# Patient Record
Sex: Male | Born: 1940
Health system: Southern US, Community
[De-identification: ages and names within clinical notes are randomized; demographics above are authoritative.]

## PROBLEM LIST (undated history)

## (undated) DIAGNOSIS — Z862 Personal history of diseases of the blood and blood-forming organs and certain disorders involving the immune mechanism: Secondary | ICD-10-CM

## (undated) DIAGNOSIS — C679 Malignant neoplasm of bladder, unspecified: Secondary | ICD-10-CM

## (undated) DIAGNOSIS — I5022 Chronic systolic (congestive) heart failure: Secondary | ICD-10-CM

## (undated) DIAGNOSIS — I480 Paroxysmal atrial fibrillation: Secondary | ICD-10-CM

## (undated) DIAGNOSIS — B0230 Zoster ocular disease, unspecified: Secondary | ICD-10-CM

## (undated) DIAGNOSIS — G629 Polyneuropathy, unspecified: Secondary | ICD-10-CM

## (undated) DIAGNOSIS — I2510 Atherosclerotic heart disease of native coronary artery without angina pectoris: Secondary | ICD-10-CM

## (undated) DIAGNOSIS — C61 Malignant neoplasm of prostate: Secondary | ICD-10-CM

## (undated) DIAGNOSIS — Z9119 Patient's noncompliance with other medical treatment and regimen: Secondary | ICD-10-CM

## (undated) DIAGNOSIS — Z8546 Personal history of malignant neoplasm of prostate: Secondary | ICD-10-CM

## (undated) DIAGNOSIS — E785 Hyperlipidemia, unspecified: Secondary | ICD-10-CM

## (undated) DIAGNOSIS — C801 Malignant (primary) neoplasm, unspecified: Secondary | ICD-10-CM

## (undated) DIAGNOSIS — I252 Old myocardial infarction: Secondary | ICD-10-CM

## (undated) DIAGNOSIS — E669 Obesity, unspecified: Secondary | ICD-10-CM

## (undated) DIAGNOSIS — I10 Essential (primary) hypertension: Secondary | ICD-10-CM

## (undated) DIAGNOSIS — Z955 Presence of coronary angioplasty implant and graft: Secondary | ICD-10-CM

## (undated) DIAGNOSIS — I255 Ischemic cardiomyopathy: Secondary | ICD-10-CM

## (undated) DIAGNOSIS — N183 Chronic kidney disease, stage 3 unspecified: Secondary | ICD-10-CM

## (undated) DIAGNOSIS — K219 Gastro-esophageal reflux disease without esophagitis: Secondary | ICD-10-CM

## (undated) DIAGNOSIS — Z95810 Presence of automatic (implantable) cardiac defibrillator: Secondary | ICD-10-CM

## (undated) DIAGNOSIS — Z87442 Personal history of urinary calculi: Secondary | ICD-10-CM

## (undated) DIAGNOSIS — S8010XA Contusion of unspecified lower leg, initial encounter: Secondary | ICD-10-CM

## (undated) DIAGNOSIS — I219 Acute myocardial infarction, unspecified: Secondary | ICD-10-CM

## (undated) DIAGNOSIS — L03116 Cellulitis of left lower limb: Secondary | ICD-10-CM

## (undated) DIAGNOSIS — M1990 Unspecified osteoarthritis, unspecified site: Secondary | ICD-10-CM

## (undated) DIAGNOSIS — K635 Polyp of colon: Secondary | ICD-10-CM

## (undated) DIAGNOSIS — G4733 Obstructive sleep apnea (adult) (pediatric): Secondary | ICD-10-CM

## (undated) DIAGNOSIS — E119 Type 2 diabetes mellitus without complications: Secondary | ICD-10-CM

## (undated) DIAGNOSIS — Z91199 Patient's noncompliance with other medical treatment and regimen due to unspecified reason: Secondary | ICD-10-CM

## (undated) DIAGNOSIS — M353 Polymyalgia rheumatica: Secondary | ICD-10-CM

## (undated) HISTORY — PX: LAPAROSCOPIC INGUINAL HERNIA REPAIR: SUR788

## (undated) HISTORY — DX: Essential (primary) hypertension: I10

## (undated) HISTORY — PX: CORONARY ANGIOPLASTY WITH STENT PLACEMENT: SHX49

## (undated) HISTORY — DX: Atherosclerotic heart disease of native coronary artery without angina pectoris: I25.10

## (undated) HISTORY — DX: Gastro-esophageal reflux disease without esophagitis: K21.9

## (undated) HISTORY — DX: Hyperlipidemia, unspecified: E78.5

## (undated) HISTORY — PX: OTHER SURGICAL HISTORY: SHX169

## (undated) HISTORY — DX: Ischemic cardiomyopathy: I25.5

## (undated) HISTORY — DX: Zoster ocular disease, unspecified: B02.30

## (undated) HISTORY — DX: Polymyalgia rheumatica: M35.3

## (undated) HISTORY — DX: Polyp of colon: K63.5

## (undated) HISTORY — DX: Obesity, unspecified: E66.9

## (undated) HISTORY — DX: Unspecified osteoarthritis, unspecified site: M19.90

---

## 1999-09-16 ENCOUNTER — Ambulatory Visit (HOSPITAL_COMMUNITY): Admission: RE | Admit: 1999-09-16 | Discharge: 1999-09-16 | Payer: Self-pay | Admitting: *Deleted

## 2008-09-11 ENCOUNTER — Ambulatory Visit (HOSPITAL_COMMUNITY): Admission: RE | Admit: 2008-09-11 | Discharge: 2008-09-12 | Payer: Self-pay | Admitting: General Surgery

## 2008-09-26 ENCOUNTER — Ambulatory Visit: Admission: RE | Admit: 2008-09-26 | Discharge: 2008-12-25 | Payer: Self-pay | Admitting: Radiation Oncology

## 2008-11-24 ENCOUNTER — Ambulatory Visit (HOSPITAL_BASED_OUTPATIENT_CLINIC_OR_DEPARTMENT_OTHER): Admission: RE | Admit: 2008-11-24 | Discharge: 2008-11-24 | Payer: Self-pay | Admitting: Urology

## 2008-12-29 ENCOUNTER — Ambulatory Visit: Admission: RE | Admit: 2008-12-29 | Discharge: 2009-01-30 | Payer: Self-pay | Admitting: Radiation Oncology

## 2009-02-05 ENCOUNTER — Encounter: Payer: Self-pay | Admitting: Family Medicine

## 2009-06-20 ENCOUNTER — Ambulatory Visit: Payer: Self-pay | Admitting: Family Medicine

## 2009-06-20 DIAGNOSIS — I10 Essential (primary) hypertension: Secondary | ICD-10-CM | POA: Insufficient documentation

## 2009-06-20 DIAGNOSIS — E1365 Other specified diabetes mellitus with hyperglycemia: Secondary | ICD-10-CM | POA: Insufficient documentation

## 2009-06-20 DIAGNOSIS — E785 Hyperlipidemia, unspecified: Secondary | ICD-10-CM | POA: Insufficient documentation

## 2009-06-20 DIAGNOSIS — N401 Enlarged prostate with lower urinary tract symptoms: Secondary | ICD-10-CM | POA: Insufficient documentation

## 2009-06-20 DIAGNOSIS — E1351 Other specified diabetes mellitus with diabetic peripheral angiopathy without gangrene: Secondary | ICD-10-CM | POA: Insufficient documentation

## 2009-06-20 DIAGNOSIS — N138 Other obstructive and reflux uropathy: Secondary | ICD-10-CM | POA: Insufficient documentation

## 2009-06-20 DIAGNOSIS — C61 Malignant neoplasm of prostate: Secondary | ICD-10-CM | POA: Insufficient documentation

## 2009-06-21 LAB — CONVERTED CEMR LAB
ALT: 20 units/L (ref 0–53)
Albumin: 3.9 g/dL (ref 3.5–5.2)
Chloride: 105 meq/L (ref 96–112)
Cholesterol: 148 mg/dL (ref 0–200)
GFR calc non Af Amer: 89.85 mL/min (ref >60)
HDL: 48.8 mg/dL (ref >39.00)
Hgb A1c MFr Bld: 6.6 % — ABNORMAL HIGH (ref 4.6–6.5)
LDL Cholesterol: 83 mg/dL (ref 0–99)
Potassium: 4.6 meq/L (ref 3.5–5.1)
Sodium: 142 meq/L (ref 135–145)
Total Protein: 6.9 g/dL (ref 6.0–8.3)
Triglycerides: 83 mg/dL (ref 0.0–149.0)
VLDL: 16.6 mg/dL (ref 0.0–40.0)

## 2009-06-22 ENCOUNTER — Telehealth: Payer: Self-pay | Admitting: Family Medicine

## 2009-06-25 ENCOUNTER — Telehealth: Payer: Self-pay | Admitting: *Deleted

## 2009-07-09 ENCOUNTER — Encounter: Payer: Self-pay | Admitting: Family Medicine

## 2009-07-19 ENCOUNTER — Ambulatory Visit: Payer: Self-pay | Admitting: Family Medicine

## 2009-07-19 DIAGNOSIS — B0239 Other herpes zoster eye disease: Secondary | ICD-10-CM | POA: Insufficient documentation

## 2009-07-27 ENCOUNTER — Ambulatory Visit: Payer: Self-pay | Admitting: Family Medicine

## 2009-08-23 ENCOUNTER — Telehealth: Payer: Self-pay | Admitting: Internal Medicine

## 2009-09-18 ENCOUNTER — Telehealth (INDEPENDENT_AMBULATORY_CARE_PROVIDER_SITE_OTHER): Payer: Self-pay | Admitting: *Deleted

## 2009-09-24 ENCOUNTER — Ambulatory Visit: Payer: Self-pay | Admitting: Family Medicine

## 2009-09-25 LAB — CONVERTED CEMR LAB
ALT: 21 units/L (ref 0–53)
AST: 24 units/L (ref 0–37)
Cholesterol: 144 mg/dL (ref 0–200)
HDL: 43 mg/dL (ref >39.00)
Hgb A1c MFr Bld: 6.5 % (ref 4.6–6.5)
Total Bilirubin: 0.8 mg/dL (ref 0.3–1.2)
Total Protein: 6.2 g/dL (ref 6.0–8.3)
VLDL: 17.6 mg/dL (ref 0.0–40.0)

## 2009-11-02 ENCOUNTER — Encounter (INDEPENDENT_AMBULATORY_CARE_PROVIDER_SITE_OTHER): Payer: Self-pay | Admitting: *Deleted

## 2009-12-20 ENCOUNTER — Telehealth: Payer: Self-pay | Admitting: Family Medicine

## 2009-12-21 ENCOUNTER — Ambulatory Visit: Payer: Self-pay | Admitting: Family Medicine

## 2009-12-21 DIAGNOSIS — J209 Acute bronchitis, unspecified: Secondary | ICD-10-CM | POA: Insufficient documentation

## 2010-01-28 ENCOUNTER — Ambulatory Visit: Payer: Self-pay | Admitting: Family Medicine

## 2010-01-28 DIAGNOSIS — L259 Unspecified contact dermatitis, unspecified cause: Secondary | ICD-10-CM | POA: Insufficient documentation

## 2010-01-28 DIAGNOSIS — J310 Chronic rhinitis: Secondary | ICD-10-CM | POA: Insufficient documentation

## 2010-01-30 LAB — CONVERTED CEMR LAB
Hgb A1c MFr Bld: 7 % — ABNORMAL HIGH (ref 4.6–6.5)
Microalb Creat Ratio: 9.7 mg/g (ref 0.0–30.0)

## 2010-02-11 ENCOUNTER — Ambulatory Visit: Payer: Self-pay | Admitting: Family Medicine

## 2010-04-01 ENCOUNTER — Telehealth: Payer: Self-pay | Admitting: Family Medicine

## 2010-06-10 ENCOUNTER — Ambulatory Visit: Payer: Self-pay | Admitting: Family Medicine

## 2010-06-12 LAB — CONVERTED CEMR LAB: Hgb A1c MFr Bld: 7.7 % — ABNORMAL HIGH (ref 4.6–6.5)

## 2010-06-28 ENCOUNTER — Telehealth: Payer: Self-pay | Admitting: Family Medicine

## 2010-08-09 ENCOUNTER — Telehealth: Payer: Self-pay | Admitting: Family Medicine

## 2010-08-29 ENCOUNTER — Ambulatory Visit: Payer: Self-pay | Admitting: Family Medicine

## 2010-09-12 ENCOUNTER — Ambulatory Visit: Payer: Self-pay | Admitting: Family Medicine

## 2010-11-25 ENCOUNTER — Ambulatory Visit: Payer: Self-pay | Admitting: Family Medicine

## 2010-11-25 LAB — CONVERTED CEMR LAB
Cholesterol, target level: 200 mg/dL
HDL goal, serum: 40 mg/dL
LDL Goal: 100 mg/dL

## 2010-11-28 LAB — CONVERTED CEMR LAB
Direct LDL: 142.9 mg/dL
HDL: 40 mg/dL (ref >39.00)
Hgb A1c MFr Bld: 6.7 % — ABNORMAL HIGH (ref 4.6–6.5)
Triglycerides: 87 mg/dL (ref 0.0–149.0)

## 2011-01-13 ENCOUNTER — Ambulatory Visit
Admission: RE | Admit: 2011-01-13 | Discharge: 2011-01-13 | Payer: Self-pay | Source: Home / Self Care | Attending: Family Medicine | Admitting: Family Medicine

## 2011-01-13 ENCOUNTER — Encounter: Payer: Self-pay | Admitting: Family Medicine

## 2011-01-13 DIAGNOSIS — R109 Unspecified abdominal pain: Secondary | ICD-10-CM | POA: Insufficient documentation

## 2011-01-14 ENCOUNTER — Telehealth: Payer: Self-pay | Admitting: Family Medicine

## 2011-01-14 NOTE — Assessment & Plan Note (Signed)
Summary: URI, congestion, OV per Dr Cleotis Nipper   Vital Signs:  Patient profile:   70 year old male Temp:     99.0 degrees F oral BP sitting:   120 / 80  (left arm) Cuff size:   large  Vitals Entered By: Sid Falcon LPN (December 21, 2009 4:29 PM) CC: URI, congestion, terrible cough   History of Present Illness: Acute visit. One week history of progressive cough. Wheezing off and on. Cough especially bad at night. Cough productive yellowish sputum. He tried Mucinex without much improvement. No fever. Nonsmoker.  Allergies: 1)  Lipitor (Atorvastatin Calcium)  Past History:  Past Medical History: Last updated: 06/20/2009 Arthritis Prostate cancer Diabetes Hyperlipidemia Hypertension  Social History: Last updated: 06/20/2009 Occupation:  Truck driver Married Past smoker, quit  Review of Systems      See HPI  Physical Exam  General:  Well-developed,well-nourished,in no acute distress; alert,appropriate and cooperative throughout examination Ears:  External ear exam shows no significant lesions or deformities.  Otoscopic examination reveals clear canals, tympanic membranes are intact bilaterally without bulging, retraction, inflammation or discharge. Hearing is grossly normal bilaterally. Mouth:  Oral mucosa and oropharynx without lesions or exudates.  Teeth in good repair. Neck:  No deformities, masses, or tenderness noted. Lungs:  patient has some diffuse expiratory wheezes. No rales. No retractions. Symmetric breath sounds. Heart:  Normal rate and regular rhythm. S1 and S2 normal without gallop, murmur, click, rub or other extra sounds.   Impression & Recommendations:  Problem # 1:  BRONCHITIS, ACUTE WITH MILD BRONCHOSPASM (ICD-466.0)  Depo-Medrol 80 mg IM given. Start Zithromax. Cough medication prescribed  Orders: Depo- Medrol 80mg  (J1040) Admin of Therapeutic Inj  intramuscular or subcutaneous (43329)  His updated medication list for this problem  includes:    Hydrocodone-homatropine 5-1.5 Mg/64ml Syrp (Hydrocodone-homatropine) ..... One tsp by mouth q 4-6 hours as needed cough    Azithromycin 250 Mg Tabs (Azithromycin) .Marland Kitchen... 2 by mouth today then one by mouth once daily for 4 days.  Complete Medication List: 1)  Hydrochlorothiazide 25 Mg Tabs (Hydrochlorothiazide) .... Once daily 2)  Crestor 10 Mg Tabs (Rosuvastatin calcium) .... Once daily 3)  Lisinopril 10 Mg Tabs (Lisinopril) .... One by mouth daily 4)  Actos 45 Mg Tabs (Pioglitazone hcl) .... Once daily 5)  Centrum Silver Ultra Mens Tabs (Multiple vitamins-minerals) .... Once daily 6)  Hydrocodone-homatropine 5-1.5 Mg/25ml Syrp (Hydrocodone-homatropine) .... One tsp by mouth q 4-6 hours as needed cough 7)  Azithromycin 250 Mg Tabs (Azithromycin) .... 2 by mouth today then one by mouth once daily for 4 days.  Patient Instructions: 1)  Acute Bronchitis symptoms for less then 10 days are not  helped by antibiotics. Take over the counter cough medications. Call if no improvement in 5-7 days, sooner if increasing cough, fever, or new symptoms ( shortness of breath, chest pain) .  Prescriptions: AZITHROMYCIN 250 MG TABS (AZITHROMYCIN) 2 by mouth today then one by mouth once daily for 4 days.  #6 x 0   Entered and Authorized by:   Evelena Peat MD   Signed by:   Evelena Peat MD on 12/21/2009   Method used:   Print then Give to Patient   RxID:   [id number] HYDROCODONE-HOMATROPINE 5-1.5 MG/5ML SYRP (HYDROCODONE-HOMATROPINE) one tsp by mouth q 4-6 hours as needed cough  #120 ml x 0   Entered and Authorized by:   Evelena Peat MD   Signed by:   Evelena Peat MD on 12/21/2009   Method used:  Print then Give to Patient   RxID:   [id number]     Medication Administration  Injection # 1:    Medication: Depo- Medrol 80mg     Diagnosis: BRONCHITIS, ACUTE WITH MILD BRONCHOSPASM (ICD-466.0)    Route: IM    Site: LUOQ gluteus    Exp Date: 09/14/2010    Lot #:  [id number]    Mfr: teva    Patient tolerated injection without complications    Given by: Sid Falcon LPN (December 21, 2009 5:02 PM)  Orders Added: 1)  Est. Patient Level III [21308] 2)  Depo- Medrol 80mg  [J1040] 3)  Admin of Therapeutic Inj  intramuscular or subcutaneous [65784]

## 2011-01-14 NOTE — Progress Notes (Signed)
Summary: Refill One touch Test Strips  Phone Note Refill Request Call back at Work Phone [phone] Message from:  Patient---live call  Refills Requested: Medication #1:  ONETOUCH ULTRA TEST  STRP test daily call [facility] on [address].  Initial call taken by: Warnell Forester,  August 09, 2010 10:05 AM  Follow-up for Phone Call        OK to refill Follow-up by: Evelena Peat MD,  August 09, 2010 11:30 AM    Prescriptions: Koren Bound TEST  STRP (GLUCOSE BLOOD) test daily  #100 x 3   Entered by:   Sid Falcon LPN   Authorized by:   Evelena Peat MD   Signed by:   Sid Falcon LPN on 57/84/6962   Method used:   Electronically to        [facility] [address] (retail)       [phone]       [name], [place]  [address]       Ph: [phone]       Fax: [phone]   RxID:   [id number]

## 2011-01-14 NOTE — Assessment & Plan Note (Signed)
Summary: shingles/cjr   Vital Signs:  Patient profile:   70 year old male Temp:     99.1 degrees F oral BP sitting:   120 / 70  (left arm) Cuff size:   large  Vitals Entered By: Sid Falcon LPN (February 11, 2010 11:41 AM) CC: Shingles ?   History of Present Illness: Patient seen with the following items.  First, he had shingles several months ago with left facial involvement. No residual pain or rash. Over the past week has had some itching in distribution similar to his previous rash. Increased work stress and he thinks that may have exacerbated.  Patient has a new rash which is bilateral axillary. Denies any change in deodorant. Rash is pruritic. No associated pain. No alleviating factors. No clear exacerbating factors. No other areas of rash.  Allergies: 1)  Lipitor (Atorvastatin Calcium)  Past History:  Past Medical History: Last updated: 06/20/2009 Arthritis Prostate cancer Diabetes Hyperlipidemia Hypertension PMH reviewed for relevance  Review of Systems  The patient denies anorexia, fever, vision loss, and chest pain.    Physical Exam  General:  Well-developed,well-nourished,in no acute distress; alert,appropriate and cooperative throughout examination Head:  Normocephalic and atraumatic without obvious abnormalities. No apparent alopecia or balding. Eyes:  pupils equal, pupils round, and pupils reactive to light.  pupils equal, pupils round, and pupils reactive to light.   Ears:  External ear exam shows no significant lesions or deformities.  Otoscopic examination reveals clear canals, tympanic membranes are intact bilaterally without bulging, retraction, inflammation or discharge. Hearing is grossly normal bilaterally. Mouth:  Oral mucosa and oropharynx without lesions or exudates.  Teeth in good repair. Lungs:  Normal respiratory effort, chest expands symmetrically. Lungs are clear to auscultation, no crackles or wheezes. Heart:  normal rate and regular  rhythm.  normal rate and regular rhythm.   Skin:  patient has nonspecific macular slightly erythematous rash axillary region right and left in patches. Non-scaly. No pustules. Nontender.   Impression & Recommendations:  Problem # 1:  ECZEMA (ICD-692.9)  His updated medication list for this problem includes:    Triamcinolone Acetonide 0.5 % Crea (Triamcinolone acetonide) .Marland Kitchen... Apply to affected rash two times a day as needed  Complete Medication List: 1)  Hydrochlorothiazide 25 Mg Tabs (Hydrochlorothiazide) .... Once daily 2)  Crestor 10 Mg Tabs (Rosuvastatin calcium) .... Once daily 3)  Lisinopril 10 Mg Tabs (Lisinopril) .... One by mouth daily 4)  Centrum Silver Ultra Mens Tabs (Multiple vitamins-minerals) .... Once daily 5)  Metformin Hcl 500 Mg Tabs (Metformin hcl) .... One by mouth two times a day 6)  Triamcinolone Acetonide 0.5 % Crea (Triamcinolone acetonide) .... Apply to affected rash two times a day as needed 7)  Onetouch Ultra Test Strp (Glucose blood) .... Test daily  Patient Instructions: 1)  Consider use of over-the-counter Claritin or Zyrtec for itching Prescriptions: ONETOUCH ULTRA TEST  STRP (GLUCOSE BLOOD) test daily  #100 x 3   Entered by:   Sid Falcon LPN   Authorized by:   Evelena Peat MD   Signed by:   Sid Falcon LPN on 16/09/9603   Method used:   Electronically to        [facility] (retail)       [address]       [place], [place]  [address]       Ph: [phone] or [phone]       Fax: [phone]   RxID:   [id number] TRIAMCINOLONE ACETONIDE 0.5 %  CREA (TRIAMCINOLONE ACETONIDE) apply to affected rash two times a day as needed  #30 gm x 3   Entered and Authorized by:   Evelena Peat MD   Signed by:   Evelena Peat MD on 02/11/2010   Method used:   Electronically to        [facility] (retail)       [address]       [place], [place]  [address]       Ph: [phone] or [phone]       Fax: [phone]   RxID:    [id number]

## 2011-01-14 NOTE — Assessment & Plan Note (Signed)
Summary: flu shot/alp  Nurse Visit   Allergies: 1)  Lipitor (Atorvastatin Calcium)  Immunizations Administered:  Influenza Vaccine # 1:    Vaccine Type: Fluvax MCR    Site: left deltoid    Mfr: GlaxoSmithKline    Dose: 0.5 ml    Route: IM    Given by: Kathrynn Speed CMA    Exp. Date: 06/14/2011    Lot #: [id number]    VIS given: 07/09/10 version given September 12, 2010.  Flu Vaccine Consent Questions:    Do you have a history of severe allergic reactions to this vaccine? no    Any prior history of allergic reactions to egg and/or gelatin? no    Do you have a sensitivity to the preservative Thimersol? no    Do you have a past history of Guillan-Barre Syndrome? no    Do you currently have an acute febrile illness? no    Have you ever had a severe reaction to latex? no    Vaccine information given and explained to patient? yes  Orders Added: 1)  Influenza Vaccine MCR [00025]

## 2011-01-14 NOTE — Progress Notes (Signed)
Summary: Pt req zpak called in to [facility]  Phone Note Call from Patient Call back at [phone] [place] [employer]   Caller: spouse- Rubin Payor Summary of Call: Pt is still sick and is req a  zpak called in to [facility].  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  C/O chest congestion, some sinus pressure X 7-10 days, not getting any better.  No fever, very little cough, no headache.  Requesting Z-pack.  Can come in tomorrow if necessary. Follow-up by: Sid Falcon LPN,  December 20, 2009 12:00 PM  Additional Follow-up for Phone Call Additional follow up Details #1::        We should see him tomorrow. Additional Follow-up by: Evelena Peat MD,  December 20, 2009 12:37 PM    Additional Follow-up for Phone Call Additional follow up Details #2::    Pt scheduled 4:30 pm tomorrow.  Wife will call and cancel if he will not be back in town in time. Follow-up by: Sid Falcon LPN,  December 20, 2009 2:20 PM

## 2011-01-14 NOTE — Progress Notes (Signed)
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 Pt is also needing refill of Metformin, Lisinopril.  Please call in to [facility].   Initial call taken by: Lucy Antigua,  June 28, 2010 1:53 PM  Follow-up for Phone Call        spoke with pt - explained Dr. Caryl Never out of town until Monday - will refill Metformin and [facility]inopril [facility] and leave message for Dr. to advise about change for Crestor ($400) - too expensive. KIK Follow-up by: Duard Brady LPN,  June 28, 2010 3:13 PM  Additional Follow-up for Phone Call Additional follow up Details #1::        OK to change to Simvastatin 20 mg by mouth once daily and recheck lipids and hepatic in 2 months. Additional Follow-up by: Evelena Peat MD,  June 30, 2010 4:04 PM    Additional Follow-up for Phone Call Additional follow up Details #2::    Pt informed on personally identified VM.  Requested he call for lab repeat in 2 months Follow-up by: Sid Falcon LPN,  July 01, 2010 3:38 PM  New/Updated Medications: SIMVASTATIN 20 MG TABS (SIMVASTATIN) once daily Prescriptions: SIMVASTATIN 20 MG TABS (SIMVASTATIN) once daily  #30 x 3   Entered by:   Sid Falcon LPN   Authorized by:   Evelena Peat MD   Signed by:   Sid Falcon LPN on 29/56/2130   Method used:   Electronically to        [facility] [address] (retail)       [phone]       [place], [place]  [address]       Ph: [phone]       Fax: [phone]   RxID:   [id number] METFORMIN HCL 1000 MG TABS (METFORMIN HCL) one tab two times a day  #180 x 0   Entered by:   Duard Brady LPN   Authorized by:   Evelena Peat MD   Signed  by:   Duard Brady LPN on 56/43/3295   Method used:   Electronically to        [facility] [address] (retail)       [phone]       [place], [place]  [address]       Ph: [phone]       Fax: [phone]   RxID:   [id number] LISINOPRIL 10 MG TABS (LISINOPRIL) one by mouth daily  #90 x 0   Entered by:   Duard Brady LPN   Authorized by:   Evelena Peat MD   Signed by:   Duard Brady LPN on 61/60/7371   Method used:   Electronically to        [facility] [address] (retail)       [phone]       [place], [place]  [address]       Ph: [phone]       Fax: [phone]   RxID:   [id number]

## 2011-01-14 NOTE — Assessment & Plan Note (Signed)
Summary: med check/follow up on bp/cjr/wife rescheduled   Vital Signs:  Patient profile:   70 year old male Weight:      256 pounds Temp:     98.0 degrees F oral BP sitting:   122 / 80  (left arm) Cuff size:   large  Vitals Entered By: Sid Falcon LPN (June 10, 2010 8:56 AM)  History of Present Illness: Follow up diabetes. A1C 7.0 in February.  On metformin.  Some urine freq and thirst. Lost 2 pounds.  Diet changes with reduction of sugars and starches.  Hypertension has been well controlled.  compliant with meds.  Lipids well controlled in Oct.  Diabetes Management History:      He has not been enrolled in the "Diabetic Education Program".  He states understanding of dietary principles and is following his diet appropriately.  No sensory loss is reported.  Self foot exams are being performed.  He is checking home blood sugars.  He says that he is not exercising regularly.        Hypoglycemic symptoms are not occurring.  Hyperglycemic symptoms include polyuria and polydipsia.        No changes have been made to his treatment plan since last visit.    Hypertension History:      He denies headache, palpitations, dyspnea with exertion, orthopnea, PND, peripheral edema, neurologic problems, syncope, and side effects from treatment.  He notes no problems with any antihypertensive medication side effects.        Positive major cardiovascular risk factors include male age 65 years old or older, diabetes, hyperlipidemia, and hypertension.  Negative major cardiovascular risk factors include non-tobacco-user status.    Allergies: 1)  Lipitor (Atorvastatin Calcium)  Past History:  Past Medical History: Last updated: 06/20/2009 Arthritis Prostate cancer Diabetes Hyperlipidemia Hypertension  Social History: Last updated: 06/20/2009 Occupation:  Truck driver Married Past smoker, quit PMH reviewed for relevance, SH/Risk Factors reviewed for relevance  Review of Systems      See  HPI  Physical Exam  General:  Well-developed,well-nourished,in no acute distress; alert,appropriate and cooperative throughout examination Eyes:  pupils equal, pupils round, and pupils reactive to light.   Ears:  External ear exam shows no significant lesions or deformities.  Otoscopic examination reveals clear canals, tympanic membranes are intact bilaterally without bulging, retraction, inflammation or discharge. Hearing is grossly normal bilaterally. Mouth:  Oral mucosa and oropharynx without lesions or exudates.  Teeth in good repair. Neck:  No deformities, masses, or tenderness noted. Lungs:  Normal respiratory effort, chest expands symmetrically. Lungs are clear to auscultation, no crackles or wheezes. Heart:  normal rate and regular rhythm.   Pulses:  normal DP pulses. Extremities:  no edema Skin:  no rashes.   Psych:  normally interactive, good eye contact, not anxious appearing, and not depressed appearing.    Diabetes Management Exam:    Foot Exam (with socks and/or shoes not present):       Sensory-Pinprick/Light touch:          Left medial foot (L-4): normal          Left dorsal foot (L-5): normal          Left lateral foot (S-1): normal          Right medial foot (L-4): normal          Right dorsal foot (L-5): normal          Right lateral foot (S-1): normal       Sensory-Monofilament:  Left foot: normal          Right foot: normal       Inspection:          Left foot: normal          Right foot: normal       Nails:          Left foot: normal          Right foot: normal    Eye Exam:       Eye Exam done elsewhere          Date: 03/15/2009          Results: normal   Impression & Recommendations:  Problem # 1:  DM (ICD-250.00)  His updated medication list for this problem includes:    Lisinopril 10 Mg Tabs (Lisinopril) ..... One by mouth daily    Metformin Hcl 500 Mg Tabs (Metformin hcl) ..... One by mouth two times a day  Orders: Venipuncture  (60454) TLB-A1C / Hgb A1C (Glycohemoglobin) (83036-A1C)  Problem # 2:  HYPERTENSION (ICD-401.9)  His updated medication list for this problem includes:    Hydrochlorothiazide 25 Mg Tabs (Hydrochlorothiazide) ..... Once daily    Lisinopril 10 Mg Tabs (Lisinopril) ..... One by mouth daily  Problem # 3:  HYPERLIPIDEMIA (ICD-272.4)  His updated medication list for this problem includes:    Crestor 10 Mg Tabs (Rosuvastatin calcium) ..... Once daily  Complete Medication List: 1)  Hydrochlorothiazide 25 Mg Tabs (Hydrochlorothiazide) .... Once daily 2)  Crestor 10 Mg Tabs (Rosuvastatin calcium) .... Once daily 3)  Lisinopril 10 Mg Tabs (Lisinopril) .... One by mouth daily 4)  Centrum Silver Ultra Mens Tabs (Multiple vitamins-minerals) .... Once daily 5)  Metformin Hcl 500 Mg Tabs (Metformin hcl) .... One by mouth two times a day 6)  Triamcinolone Acetonide 0.5 % Crea (Triamcinolone acetonide) .... Apply to affected rash two times a day as needed 7)  Onetouch Ultra Test Strp (Glucose blood) .... Test daily  Hypertension Assessment/Plan:      The patient's hypertensive risk group is category C: Target organ damage and/or diabetes.  His calculated 10 year risk of coronary heart disease is 14 %.  Today's blood pressure is 122/80.    Patient Instructions: 1)  Please schedule a follow-up appointment in 4 months .  2)  You need to lose weight. Consider a lower calorie diet and regular exercise.  3)  Check your blood sugars regularly. If your readings are usually above:  or below 70 you should contact our office.  4)  It is important that your diabetic A1c level is checked every 3 months.  5)  See your eye doctor yearly to check for diabetic eye damage. 6)  Check your feet each night  for sore areas, calluses or signs of infection.

## 2011-01-14 NOTE — Assessment & Plan Note (Signed)
Summary: 4 month rov/njr   Vital Signs:  Patient profile:   70 year old male Height:      70 inches Weight:      261 pounds Temp:     99.4 degrees F oral BP sitting:   130 / 84  (left arm) Cuff size:   large  Vitals Entered By: Sid Falcon LPN (January 28, 2010 8:36 AM) CC: 4 month follow-up, Hypertension Management Is Patient Diabetic? Yes   History of Present Illness: Patient seen for the following items  type 2 diabetes which has been well controlled. Recent A1c 6.5%. Due for eye exam soon. Recent pruritus which seemed to improve after stopping Actos. Also significant lower extremity edema. Never been on metformin. No hypoglycemia.  Frequent rhinitis symptoms worsen night. 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 frequent nasal stuffiness. No headaches. Occasional maxillary facial pain. No sneezing  pruritic rash mostly trunk. Comes and goes. No clear exacerbating features. Has not tried any topical creams.  Diabetes Management History:      He has not been enrolled in the "Diabetic Education Program".  He states understanding of dietary principles and is following his diet appropriately.  No sensory loss is reported.  Self foot exams are being performed.  He is checking home blood sugars.  He says that he is not exercising regularly.        Hypoglycemic symptoms are not occurring.  No hyperglycemic symptoms are reported.        There are no symptoms to suggest diabetic complications.  No changes have been made to his treatment plan since last visit.    Hypertension History:      He complains of peripheral edema, but denies headache, chest pain, palpitations, dyspnea with exertion, orthopnea, PND, visual symptoms, neurologic problems, syncope, and side effects from treatment.        Positive major cardiovascular risk factors include male age 70 years old or older, diabetes, hyperlipidemia, and hypertension.  Negative major cardiovascular  risk factors include non-tobacco-user status.     Preventive Screening-Counseling & Management  Caffeine-Diet-Exercise     Does Patient Exercise: no  Allergies: 1)  Lipitor (Atorvastatin Calcium)  Past History:  Past Medical History: Last updated: 06/20/2009 Arthritis Prostate cancer Diabetes Hyperlipidemia Hypertension  Past Surgical History: Last updated: 06/20/2009 Prostate cancer, seed inplant  Family History: Last updated: 06/20/2009 Family history lung cancer Family History Hypertension  Social History: Last updated: 06/20/2009 Occupation:  Truck driver Married Past smoker, quit  Risk Factors: Smoking Status: quit (09/24/2009) PMH-FH-SH reviewed for relevance  Social History: Does Patient Exercise:  no  Review of Systems  The patient denies anorexia, fever, weight loss, weight gain, vision loss, chest pain, syncope, dyspnea on exertion, peripheral edema, prolonged cough, headaches, hemoptysis, abdominal pain, melena, and hematochezia.    Physical Exam  General:  Well-developed,well-nourished,in no acute distress; alert,appropriate and cooperative throughout examination Eyes:  No corneal or conjunctival inflammation noted. EOMI. Perrla. Funduscopic exam benign, without hemorrhages, exudates or papilledema. Vision grossly normal. Ears:  External ear exam shows no significant lesions or deformities.  Otoscopic examination reveals clear canals, tympanic membranes are intact bilaterally without bulging, retraction, inflammation or discharge. Hearing is grossly normal bilaterally. Mouth:  Oral mucosa and oropharynx without lesions or exudates.  Teeth in good repair. Neck:  No deformities, masses, or tenderness noted. Lungs:  Normal respiratory effort, chest expands symmetrically. Lungs are clear to auscultation, no crackles or wheezes. Heart:  Normal rate and regular rhythm. S1 and S2 normal without gallop, murmur, click, rub or other extra sounds. Extremities:   trace edema lower legs and ankles bilaterally Skin:  patient has a nonspecific scaly erythematous patches on his trunk. Very dry skin in places.  Diabetes Management Exam:    Foot Exam (with socks and/or shoes not present):       Sensory-Pinprick/Light touch:          Left medial foot (L-4): normal          Left dorsal foot (L-5): normal          Left lateral foot (S-1): normal          Right medial foot (L-4): normal          Right dorsal foot (L-5): normal          Right lateral foot (S-1): normal       Sensory-Monofilament:          Left foot: normal          Right foot: normal       Inspection:          Left foot: normal          Right foot: normal       Nails:          Left foot: normal          Right foot: normal    Eye Exam:       Eye Exam done elsewhere          Date: 03/15/2009          Results: normal          Done by: Dr Nile Riggs   Impression & Recommendations:  Problem # 1:  DM (ICD-250.00) recommended stopping Actos and start metformin 500 mg b.i.d.  Reckeck A1C and microalbumin. The following medications were removed from the medication list:    Actos 45 Mg Tabs (Pioglitazone hcl) ..... Once daily His updated medication list for this problem includes:    Lisinopril 10 Mg Tabs (Lisinopril) ..... One by mouth daily    Metformin Hcl 500 Mg Tabs (Metformin hcl) ..... One by mouth two times a day  Orders: TLB-A1C / Hgb A1C (Glycohemoglobin) (83036-A1C) TLB-Microalbumin/Creat Ratio, Urine (82043-MALB) Venipuncture (16109)  Problem # 2:  RHINITIS (ICD-472.0) suspect a lot of his nasal symptoms are related to dry air. Try over-the-counter saline nasal mist  Problem # 3:  ECZEMA (ICD-692.9) try over-the-counter cortisone cream  Complete Medication List: 1)  Hydrochlorothiazide 25 Mg Tabs (Hydrochlorothiazide) .... Once daily 2)  Crestor 10 Mg Tabs (Rosuvastatin calcium) .... Once daily 3)  Lisinopril 10 Mg Tabs (Lisinopril) .... One by mouth daily 4)  Centrum  Silver Ultra Mens Tabs (Multiple vitamins-minerals) .... Once daily 5)  Metformin Hcl 500 Mg Tabs (Metformin hcl) .... One by mouth two times a day  Hypertension Assessment/Plan:      The patient's hypertensive risk group is category C: Target organ damage and/or diabetes.  His calculated 10 year risk of coronary heart disease is 18 %.  Today's blood pressure is 130/84.    Patient Instructions: 1)  Stop Actos 2)  Start metformin 500 mg twice daily 3)  It is important that you exercise reguarly at least 20 minutes 5 times a week. If you develop chest pain, have severe difficulty breathing, or feel very tired, stop exercising immediately and seek medical attention.  4)  You need to lose weight. Consider  a lower calorie diet and regular exercise.  5)  Check your blood sugars regularly. If your readings are usually above:140 (fasting)or below 70 you should contact our office.  6)  See your eye doctor yearly to check for diabetic eye damage. 7)  Check your feet each night  for sore areas, calluses or signs of infection.  Prescriptions: METFORMIN HCL 500 MG TABS (METFORMIN HCL) one by mouth two times a day  #180 x 3   Entered and Authorized by:   Evelena Peat MD   Signed by:   Evelena Peat MD on 01/28/2010   Method used:   Electronically to        [facility]* (mail-order)             ,          Ph: [phone]       Fax: [phone]   RxID:   [id number]

## 2011-01-14 NOTE — Progress Notes (Signed)
Summary: Refill 3 meds to [facility]  Phone Note Call from Patient   Caller: Patient Call For: Evelena Peat MD Summary of Call: VM from pt requesting call back re: refills.  Pt requesting refills be sent to [facility] Crestor, Metformin, Lisinopril, pt npo longer taking HCTZ.  He will discuss with Dr Caryl Never at [facility] in 2-3 months. Initial call taken by: Sid Falcon LPN,  April 01, 2010 4:35 PM    Prescriptions: METFORMIN HCL 500 MG TABS (METFORMIN HCL) one by mouth two times a day  #180 x 3   Entered by:   Sid Falcon LPN   Authorized by:   Evelena Peat MD   Signed by:   Sid Falcon LPN on 87/56/4332   Method used:   Electronically to        [facility]* (mail-order)             ,          Ph: [phone]       Fax: [phone]   RxID:   [id number] LISINOPRIL 10 MG TABS (LISINOPRIL) one by mouth daily  #90 x 3   Entered by:   Sid Falcon LPN   Authorized by:   Evelena Peat MD   Signed by:   Sid Falcon LPN on 23/76/2831   Method used:   Electronically to        [facility]* (mail-order)             ,          Ph: [phone]       Fax: [phone]   RxID:   [id number] CRESTOR 10 MG TABS (ROSUVASTATIN CALCIUM) once daily  #90 x 3   Entered by:   Sid Falcon LPN   Authorized by:   Evelena Peat MD   Signed by:   Sid Falcon LPN on 37/16/9678   Method used:   Electronically to        [facility]* (mail-order)             ,          Ph: [phone]       Fax: [phone]   RxID:   [id number]

## 2011-01-15 ENCOUNTER — Telehealth: Payer: Self-pay | Admitting: *Deleted

## 2011-01-15 NOTE — Telephone Encounter (Signed)
Pt's wife called and would like Dr Caryl Never to schedule the Stress Test ASAP.

## 2011-01-15 NOTE — Telephone Encounter (Signed)
Wayne [name], will you please call pt and let them know the status of the stress test.

## 2011-01-15 NOTE — Telephone Encounter (Signed)
This is already being scheuled

## 2011-01-16 ENCOUNTER — Ambulatory Visit (INDEPENDENT_AMBULATORY_CARE_PROVIDER_SITE_OTHER): Payer: Medicare Other | Admitting: Cardiology

## 2011-01-16 ENCOUNTER — Other Ambulatory Visit: Payer: Self-pay | Admitting: Cardiology

## 2011-01-16 ENCOUNTER — Encounter: Payer: Self-pay | Admitting: Cardiology

## 2011-01-16 ENCOUNTER — Ambulatory Visit (INDEPENDENT_AMBULATORY_CARE_PROVIDER_SITE_OTHER): Payer: Medicare Other

## 2011-01-16 DIAGNOSIS — E785 Hyperlipidemia, unspecified: Secondary | ICD-10-CM | POA: Insufficient documentation

## 2011-01-16 DIAGNOSIS — R0602 Shortness of breath: Secondary | ICD-10-CM

## 2011-01-16 DIAGNOSIS — I379 Nonrheumatic pulmonary valve disorder, unspecified: Secondary | ICD-10-CM | POA: Insufficient documentation

## 2011-01-16 DIAGNOSIS — R0609 Other forms of dyspnea: Secondary | ICD-10-CM

## 2011-01-16 DIAGNOSIS — E669 Obesity, unspecified: Secondary | ICD-10-CM | POA: Insufficient documentation

## 2011-01-16 DIAGNOSIS — I10 Essential (primary) hypertension: Secondary | ICD-10-CM | POA: Insufficient documentation

## 2011-01-16 DIAGNOSIS — E119 Type 2 diabetes mellitus without complications: Secondary | ICD-10-CM | POA: Insufficient documentation

## 2011-01-16 DIAGNOSIS — I079 Rheumatic tricuspid valve disease, unspecified: Secondary | ICD-10-CM | POA: Insufficient documentation

## 2011-01-16 DIAGNOSIS — I509 Heart failure, unspecified: Secondary | ICD-10-CM

## 2011-01-16 DIAGNOSIS — I080 Rheumatic disorders of both mitral and aortic valves: Secondary | ICD-10-CM | POA: Insufficient documentation

## 2011-01-16 DIAGNOSIS — I319 Disease of pericardium, unspecified: Secondary | ICD-10-CM | POA: Insufficient documentation

## 2011-01-16 LAB — CBC WITH DIFFERENTIAL/PLATELET
Basophils Relative: 0.3 % (ref 0.0–3.0)
Eosinophils Relative: 3.8 % (ref 0.0–5.0)
HCT: 44.3 % (ref 39.0–52.0)
Hemoglobin: 14.5 g/dL (ref 13.0–17.0)
Lymphs Abs: 1.7 10*3/uL (ref 0.7–4.0)
MCV: 87.8 fl (ref 78.0–100.0)
Monocytes Absolute: 0.6 10*3/uL (ref 0.1–1.0)
Monocytes Relative: 7.6 % (ref 3.0–12.0)
Neutro Abs: 5.4 10*3/uL (ref 1.4–7.7)
RBC: 5.05 Mil/uL (ref 4.22–5.81)
WBC: 8 10*3/uL (ref 4.5–10.5)

## 2011-01-16 LAB — BASIC METABOLIC PANEL
CO2: 29 mEq/L (ref 19–32)
Chloride: 107 mEq/L (ref 96–112)
Potassium: 5.2 mEq/L — ABNORMAL HIGH (ref 3.5–5.1)

## 2011-01-16 LAB — PROTIME-INR: Prothrombin Time: 11.2 s (ref 10.2–12.4)

## 2011-01-16 LAB — BRAIN NATRIURETIC PEPTIDE: Pro B Natriuretic peptide (BNP): 809.3 pg/mL — ABNORMAL HIGH (ref 0.0–100.0)

## 2011-01-16 NOTE — Assessment & Plan Note (Signed)
Summary: 3 month follow up/pt coming in fasting/per Nancy/cjr   Vital Signs:  Patient profile:   70 year old male Weight:      240 pounds Temp:     98 degrees F oral BP sitting:   130 / 78  (left arm) Cuff size:   large  Vitals Entered By: Sid Falcon LPN (November 25, 2010 8:17 AM)  History of Present Illness: Patient here for follow up medical problems.  Type 2 diabetes. Most recent A1c 7.4%. Takes metformin. Has lost about 16 pounds since June and plans to lose more. Blood sugars arranging 120-130 fasting. Postprandial around 155. No symptoms of hyperglycemia. Overdue for eye exam.  Hypertension treated with lisinopril. Compliant with therapy. No dizziness.  History of mild hyperlipidemia. For some reason he stopped his simvastatin last summer. No side effects. Overdue for lipids.  Hypertension History:      He denies headache, chest pain, palpitations, dyspnea with exertion, orthopnea, PND, peripheral edema, visual symptoms, neurologic problems, syncope, and side effects from treatment.  He notes no problems with any antihypertensive medication side effects.        Positive major cardiovascular risk factors include male age 31 years old or older, diabetes, hyperlipidemia, and hypertension.  Negative major cardiovascular risk factors include non-tobacco-user status.        Further assessment for target organ damage reveals no history of ASHD, stroke/TIA, or peripheral vascular disease.    Lipid Management History:      Positive NCEP/ATP III risk factors include male age 28 years old or older, diabetes, and hypertension.  Negative NCEP/ATP III risk factors include non-tobacco-user status, no ASHD (atherosclerotic heart disease), no prior stroke/TIA, no peripheral vascular disease, and no history of aortic aneurysm.      Allergies: 1)  Lipitor (Atorvastatin Calcium)  Past History:  Past Medical History: Last updated: 06/20/2009 Arthritis Prostate  cancer Diabetes Hyperlipidemia Hypertension  Past Surgical History: Last updated: 06/20/2009 Prostate cancer, seed inplant  Family History: Last updated: 06/20/2009 Family history lung cancer Family History Hypertension  Social History: Last updated: 06/20/2009 Occupation:  Truck driver Married Past smoker, quit  Risk Factors: Exercise: no (01/28/2010)  Risk Factors: Smoking Status: quit (09/24/2009) PMH-FH-SH reviewed for relevance  Review of Systems  The patient denies anorexia, fever, chest pain, syncope, dyspnea on exertion, peripheral edema, prolonged cough, headaches, hemoptysis, abdominal pain, melena, hematochezia, and severe indigestion/heartburn.    Physical Exam  General:  Well-developed,well-nourished,in no acute distress; alert,appropriate and cooperative throughout examination Ears:  minimal dry scaling external canals otherwise normal Mouth:  Oral mucosa and oropharynx without lesions or exudates.  Teeth in good repair. Neck:  No deformities, masses, or tenderness noted. Lungs:  Normal respiratory effort, chest expands symmetrically. Lungs are clear to auscultation, no crackles or wheezes. Heart:  Normal rate and regular rhythm. S1 and S2 normal without gallop, murmur, click, rub or other extra sounds.  Diabetes Management Exam:    Foot Exam (with socks and/or shoes not present):       Sensory-Pinprick/Light touch:          Left medial foot (L-4): normal          Left dorsal foot (L-5): normal          Left lateral foot (S-1): normal          Right medial foot (L-4): normal          Right dorsal foot (L-5): normal          Right lateral  foot (S-1): normal       Sensory-Monofilament:          Left foot: normal          Right foot: normal       Inspection:          Left foot: normal          Right foot: normal       Nails:          Left foot: normal          Right foot: normal    Eye Exam:       Eye Exam done here today          Results:  normal   Impression & Recommendations:  Problem # 1:  HYPERLIPIDEMIA (ICD-272.4)  needs repeat lipid panel will likely need to go back on simvastatin The following medications were removed from the medication list:    Crestor 10 Mg Tabs (Rosuvastatin calcium) ..... Once daily His updated medication list for this problem includes:    Simvastatin 20 Mg Tabs (Simvastatin) ..... Once daily  Orders: Venipuncture (09811) Specimen Handling (91478) TLB-Lipid Panel (80061-LIPID)  Problem # 2:  HYPERTENSION (ICD-401.9)  The following medications were removed from the medication list:    Hydrochlorothiazide 25 Mg Tabs (Hydrochlorothiazide) ..... Once daily His updated medication list for this problem includes:    Lisinopril 10 Mg Tabs (Lisinopril) ..... One by mouth daily  Problem # 3:  DM (ICD-250.00)  His updated medication list for this problem includes:    Lisinopril 10 Mg Tabs (Lisinopril) ..... One by mouth daily    Metformin Hcl 1000 Mg Tabs (Metformin hcl) ..... One tab two times a day  Orders: Venipuncture (29562) Specimen Handling (13086) TLB-A1C / Hgb A1C (Glycohemoglobin) (83036-A1C)  Complete Medication List: 1)  Lisinopril 10 Mg Tabs (Lisinopril) .... One by mouth daily 2)  Centrum Silver Ultra Mens Tabs (Multiple vitamins-minerals) .... Once daily 3)  Metformin Hcl 1000 Mg Tabs (Metformin hcl) .... One tab two times a day 4)  Triamcinolone Acetonide 0.5 % Crea (Triamcinolone acetonide) .... Apply to affected rash two times a day as needed 5)  Onetouch Ultra Test Strp (Glucose blood) .... Test daily 6)  Simvastatin 20 Mg Tabs (Simvastatin) .... Once daily  Hypertension Assessment/Plan:      The patient's hypertensive risk group is category C: Target organ damage and/or diabetes.  His calculated 10 year risk of coronary heart disease is 18 %.  Today's blood pressure is 130/78.    Lipid Assessment/Plan:      Based on NCEP/ATP III, the patient's risk factor category is  "history of diabetes".  The patient's lipid goals are as follows: Total cholesterol goal is 200; LDL cholesterol goal is 100; HDL cholesterol goal is 40; Triglyceride goal is 150.    Patient Instructions: 1)  Please schedule a follow-up appointment in 3 months .  2)  See your eye doctor yearly to check for diabetic eye damage.   Orders Added: 1)  Venipuncture [57846] 2)  Specimen Handling [99000] 3)  TLB-Lipid Panel [80061-LIPID] 4)  TLB-A1C / Hgb A1C (Glycohemoglobin) [83036-A1C] 5)  Est. Patient Level IV [96295]

## 2011-01-17 ENCOUNTER — Inpatient Hospital Stay (HOSPITAL_COMMUNITY): Payer: Medicare Other

## 2011-01-17 ENCOUNTER — Inpatient Hospital Stay (HOSPITAL_COMMUNITY)
Admission: RE | Admit: 2011-01-17 | Discharge: 2011-01-19 | DRG: 287 | Disposition: A | Discharge status: Home / Self Care | Payer: Medicare Other | Source: Ambulatory Visit | Attending: Cardiology | Admitting: Cardiology

## 2011-01-17 ENCOUNTER — Other Ambulatory Visit (HOSPITAL_COMMUNITY): Payer: Medicare Other

## 2011-01-17 DIAGNOSIS — E785 Hyperlipidemia, unspecified: Secondary | ICD-10-CM | POA: Diagnosis present

## 2011-01-17 DIAGNOSIS — R0602 Shortness of breath: Secondary | ICD-10-CM | POA: Diagnosis present

## 2011-01-17 DIAGNOSIS — Z7902 Long term (current) use of antithrombotics/antiplatelets: Secondary | ICD-10-CM

## 2011-01-17 DIAGNOSIS — E119 Type 2 diabetes mellitus without complications: Secondary | ICD-10-CM | POA: Diagnosis present

## 2011-01-17 DIAGNOSIS — I079 Rheumatic tricuspid valve disease, unspecified: Secondary | ICD-10-CM | POA: Diagnosis present

## 2011-01-17 DIAGNOSIS — I379 Nonrheumatic pulmonary valve disorder, unspecified: Secondary | ICD-10-CM | POA: Diagnosis present

## 2011-01-17 DIAGNOSIS — Z7982 Long term (current) use of aspirin: Secondary | ICD-10-CM

## 2011-01-17 DIAGNOSIS — E669 Obesity, unspecified: Secondary | ICD-10-CM | POA: Diagnosis present

## 2011-01-17 DIAGNOSIS — I509 Heart failure, unspecified: Secondary | ICD-10-CM | POA: Diagnosis present

## 2011-01-17 DIAGNOSIS — I080 Rheumatic disorders of both mitral and aortic valves: Secondary | ICD-10-CM | POA: Diagnosis present

## 2011-01-17 DIAGNOSIS — I2589 Other forms of chronic ischemic heart disease: Secondary | ICD-10-CM | POA: Diagnosis present

## 2011-01-17 DIAGNOSIS — I10 Essential (primary) hypertension: Secondary | ICD-10-CM | POA: Diagnosis present

## 2011-01-17 DIAGNOSIS — I319 Disease of pericardium, unspecified: Secondary | ICD-10-CM | POA: Diagnosis present

## 2011-01-17 DIAGNOSIS — I5023 Acute on chronic systolic (congestive) heart failure: Principal | ICD-10-CM | POA: Diagnosis present

## 2011-01-17 DIAGNOSIS — I2510 Atherosclerotic heart disease of native coronary artery without angina pectoris: Secondary | ICD-10-CM | POA: Diagnosis present

## 2011-01-17 DIAGNOSIS — I2582 Chronic total occlusion of coronary artery: Secondary | ICD-10-CM | POA: Diagnosis present

## 2011-01-17 LAB — GLUCOSE, CAPILLARY
Glucose-Capillary: 117 mg/dL — ABNORMAL HIGH (ref 70–99)
Glucose-Capillary: 190 mg/dL — ABNORMAL HIGH (ref 70–99)
Glucose-Capillary: 113 mg/dL — ABNORMAL HIGH (ref 70–99)

## 2011-01-17 LAB — HEPATIC FUNCTION PANEL
Albumin: 3.2 g/dL — ABNORMAL LOW (ref 3.5–5.2)
Indirect Bilirubin: 0.2 mg/dL — ABNORMAL LOW (ref 0.3–0.9)
Total Bilirubin: 0.3 mg/dL (ref 0.3–1.2)
Total Protein: 5.9 g/dL — ABNORMAL LOW (ref 6.0–8.3)

## 2011-01-17 LAB — CBC
MCH: 27.3 pg (ref 26.0–34.0)
MCHC: 31.9 g/dL (ref 30.0–36.0)
MCV: 85.7 fL (ref 78.0–100.0)
Platelets: 239 10*3/uL (ref 150–400)
WBC: 6.8 10*3/uL (ref 4.0–10.5)

## 2011-01-17 LAB — BASIC METABOLIC PANEL
BUN: 13 mg/dL (ref 6–23)
CO2: 24 mEq/L (ref 19–32)
Calcium: 8.7 mg/dL (ref 8.4–10.5)
Creatinine, Ser: 0.99 mg/dL (ref 0.4–1.5)
GFR calc Af Amer: >60 mL/min (ref >60)

## 2011-01-17 LAB — POCT I-STAT 3, VENOUS BLOOD GAS (G3P V)
Bicarbonate: 26.1 mEq/L — ABNORMAL HIGH (ref 20.0–24.0)
O2 Saturation: 61 %
O2 Saturation: 64 %
pO2, Ven: 34 mmHg (ref 30.0–45.0)
pO2, Ven: 35 mmHg (ref 30.0–45.0)

## 2011-01-17 LAB — POCT I-STAT 3, ART BLOOD GAS (G3+)
O2 Saturation: 94 %
pCO2 arterial: 40.7 mmHg (ref 35.0–45.0)
pH, Arterial: 7.424 (ref 7.350–7.450)
pO2, Arterial: 68 mmHg — ABNORMAL LOW (ref 80.0–100.0)

## 2011-01-18 DIAGNOSIS — I2510 Atherosclerotic heart disease of native coronary artery without angina pectoris: Secondary | ICD-10-CM

## 2011-01-18 LAB — GLUCOSE, CAPILLARY
Glucose-Capillary: 106 mg/dL — ABNORMAL HIGH (ref 70–99)
Glucose-Capillary: 153 mg/dL — ABNORMAL HIGH (ref 70–99)
Glucose-Capillary: 87 mg/dL (ref 70–99)

## 2011-01-18 LAB — CBC
HCT: 43.8 % (ref 39.0–52.0)
Hemoglobin: 14 g/dL (ref 13.0–17.0)
MCV: 86.4 fL (ref 78.0–100.0)
RBC: 5.07 MIL/uL (ref 4.22–5.81)
RDW: 14.1 % (ref 11.5–15.5)
WBC: 7.8 10*3/uL (ref 4.0–10.5)

## 2011-01-18 LAB — BASIC METABOLIC PANEL
BUN: 15 mg/dL (ref 6–23)
Chloride: 100 mEq/L (ref 96–112)
GFR calc non Af Amer: >60 mL/min (ref >60)
Glucose, Bld: 181 mg/dL — ABNORMAL HIGH (ref 70–99)
Potassium: 3.5 mEq/L (ref 3.5–5.1)
Sodium: 140 mEq/L (ref 135–145)

## 2011-01-19 DIAGNOSIS — I5023 Acute on chronic systolic (congestive) heart failure: Secondary | ICD-10-CM

## 2011-01-19 LAB — BASIC METABOLIC PANEL
CO2: 25 mEq/L (ref 19–32)
Calcium: 8.6 mg/dL (ref 8.4–10.5)
Chloride: 101 mEq/L (ref 96–112)
Glucose, Bld: 120 mg/dL — ABNORMAL HIGH (ref 70–99)
Potassium: 3.9 mEq/L (ref 3.5–5.1)
Sodium: 137 mEq/L (ref 135–145)

## 2011-01-20 ENCOUNTER — Encounter (HOSPITAL_COMMUNITY): Payer: Self-pay

## 2011-01-20 ENCOUNTER — Encounter: Payer: Self-pay | Admitting: Cardiology

## 2011-01-20 DIAGNOSIS — R079 Chest pain, unspecified: Secondary | ICD-10-CM | POA: Insufficient documentation

## 2011-01-21 DIAGNOSIS — I428 Other cardiomyopathies: Secondary | ICD-10-CM | POA: Insufficient documentation

## 2011-01-22 ENCOUNTER — Other Ambulatory Visit: Payer: Self-pay | Admitting: Cardiology

## 2011-01-22 ENCOUNTER — Encounter (INDEPENDENT_AMBULATORY_CARE_PROVIDER_SITE_OTHER): Payer: Self-pay | Admitting: *Deleted

## 2011-01-22 DIAGNOSIS — R079 Chest pain, unspecified: Secondary | ICD-10-CM

## 2011-01-22 NOTE — Assessment & Plan Note (Signed)
Summary: Per Dr. Ladona Ridgel and Dr. Lucie Leather discussion on 01/15/11.  Pat...   Vital Signs:  Patient profile:   70 year old male Height:      70 inches Weight:      246 pounds BMI:     35.42 O2 Sat:      94 % on Room air Pulse rate:   97 / minute Pulse rhythm:   regular BP sitting:   104 / 70  (left arm) Cuff size:   regular  Vitals Entered By: Judithe Modest CMA (January 16, 2011 11:24 AM)  O2 Flow:  Room air  Primary Provider:  Dr. Caryl Never   History of Present Illness: 70 yo with history of diabetes, HTN, and hyperlipidemia presents for evaluation of exertional dyspnea.  This has been new over the last week.  Patient is short of breath with mild exertion, such as walking about 200 feet, and will have to stop.  He is short of breath with even a few steps.  He also noted significant dyspnea when trying to clean his motorcycle this week.  He is not short of breath at rest.  He notes that it has been difficult for him to sleep for the last week due to orthopnea-type symptoms.  He already sleeps propped up due to GERD.  He noted some tightness in his chest this morning that resolved quickly.  Other than this, he has not really had any chest pain.  This quite significant shortness of breath is very new and prior to a week ago, he was asymptomatic.  He denies fever/chills, no significant cough, he does not feel like he has an upper respiratory infection.  CXR was done on 1/30, showing small bilateral effusions and bibasilar airspace disease concerning for pulmonary edema.    ECG: NSR, incomplete LBBB, anterolateral T wave inversions  Labs (12/11): LDL 143 (started simvastatin), HDL 40  Current Medications (verified): 1)  Lisinopril 10 Mg Tabs (Lisinopril) .... One By Mouth Daily 2)  Centrum Silver Ultra Mens  Tabs (Multiple Vitamins-Minerals) .... Once Daily 3)  Metformin Hcl 1000 Mg Tabs (Metformin Hcl) .... One Tab Two Times A Day 4)  Onetouch Ultra Test  Strp (Glucose Blood) .... Test  Daily 5)  Simvastatin 20 Mg Tabs (Simvastatin) .... Once Daily  Allergies (verified): 1)  Lipitor (Atorvastatin Calcium)  Past History:  Past Medical History: 1. HYPERTENSION  2. HYPERLIPIDEMIA  3. ECZEMA 4. RHINITIS  5. HERPES ZOSTER OPHTHALMICUS 6. ADENOCARCINOMA, PROSTATE: Status post prostatectomy in 2009.  Has had some incontinence since then.  7. Diabetes mellitus type II 8. Arthritis 9. Obesity 10.  GERD: rare  Family History: Father died with MI at age 68.  He was an alcoholic.  Mother died with cancer at around 3.   Social History: Occupation:  retired Naval architect Married, lives in [place] Past smoker, quit around 1990  Review of Systems       All systems reviewed and negative except as per HPI.   Physical Exam  General:  Well developed, well nourished, in no acute distress.  Obese.  Head:  normocephalic and atraumatic Nose:  no deformity, discharge, inflammation, or lesions Mouth:  Teeth, gums and palate normal. Oral mucosa normal. Neck:  Neck supple, JVP 10 cm. No masses, thyromegaly or abnormal cervical nodes. Lungs:  Slight crackles at the bases, otherwise clear.  Heart:  Non-displaced PMI, chest non-tender; mildly tachycardic, regular rate and rhythm, S1, S2 without murmurs, rubs or gallops. No carotid bruit.  Pedals  normal pulses. 1+ edema 3/4 to knees bilaterally.  Abdomen:  Bowel sounds positive; abdomen soft and non-tender without masses, organomegaly, or hernias noted. No hepatosplenomegaly. Extremities:  No clubbing or cyanosis. Neurologic:  Alert and oriented x 3. Skin:  Intact without lesions or rashes. Psych:  Normal affect.   Impression & Recommendations:  Problem # 1:  DYSPNEA (ICD-786.05) Patient has developed very significant exertional dyspnea with NYHA class III symptoms.  This began about a week ago.  Prior to this, he was asymptomatic.  There is no evidence for pneumonia.  On exam and by chest X ray, he appears volume overloaded.   His ECG has nonspecific changes that could be consistent with cardiac ischemia.  Cardiac risk factors include diabetes, HTN, and hyperlipidemia.  He does appear to have congestive heart failure.  I am very worried, given his risk factors, that this is due to significant coronary artery disease.  I am going to arrange for him to get a left heart catheterization tomorrow morning.  He will hold metformin.  He will continue ASA and lisinopril.  I am going to hold off on beta blocker right now with lowish blood pressure and what appears to be decompensated CHF though I expect he will need it in the future.  I will get an echo in the office today to assess his LV function.  He will need to get BMET, BNP, CBC prior to cath.  Finally, I am going to start him on Lasix 20 mg daily with 10 mEq daily of KCl today.  He will likely need a higher dose but do not want to be too aggressive prior to tomorrow's cath.   Problem # 2:  HYPERLIPIDEMIA (ICD-272.4)  LDL too high in 12/11 but has restarted simvastatin since then.   His updated medication list for this problem includes:    Simvastatin 20 Mg Tabs (Simvastatin) ..... Once daily  Other Orders: Echocardiogram (Echo) TLB-BMP (Basic Metabolic Panel-BMET) (80048-METABOL) TLB-BNP (B-Natriuretic Peptide) (83880-BNPR) TLB-CBC Platelet - w/Differential (85025-CBCD) TLB-PT (Protime) (85610-PTP) Cardiac Catheterization (Cardiac Cath)  Patient Instructions: 1)  Your physician recommends that you have lab today--BNP/BMP/CBC/PT  786.09  428.0 2)  Start Lasix(furosemide) 20mg  daily. 3)  Start KCL(potassium) 10 mEq daily. 4)  Your physician has requested that you have an echocardiogram.  Echocardiography is a painless test that uses sound waves to create images of your heart. It provides your doctor with information about the size and shape of your heart and how well your heart's chambers and valves are working.  This procedure takes approximately one hour. There are no  restrictions for this procedure. TODAY 5)  Your physician has requested that you have a cardiac catheterization.  Cardiac catheterization is used to diagnose and/or treat various heart conditions. Doctors may recommend this procedure for a number of different reasons. The most common reason is to evaluate chest pain. Chest pain can be a symptom of coronary artery disease (CAD), and cardiac catheterization can show whether plaque is narrowing or blocking your heart's arteries. This procedure is also used to evaluate the valves, as well as measure the blood flow and oxygen levels in different parts of your heart.  For further information please visit [email or web address]  Please follow instruction sheet, as given. 6)  FRIDAY FEBRUARY 3,2012 7)  Your physician recommends that you schedule a follow-up appointment in: 2 weeks with Dr Shirlee Latch. Prescriptions: KLOR-CON M10 10 MEQ CR-TABS (POTASSIUM CHLORIDE CRYS CR) one daily  #30 x 6   Entered by:  Katina Dung, RN, BSN   Authorized by:   Marca Ancona, MD   Signed by:   Katina Dung, RN, BSN on 01/16/2011   Method used:   Electronically to        [facility] (retail)       [phone]       [place], [place]  [address]       Ph: [phone]       Fax: [phone]   RxID:   [id number] FUROSEMIDE 20 MG TABS (FUROSEMIDE) one daily  #30 x 6   Entered by:   Katina Dung, RN, BSN   Authorized by:   Marca Ancona, MD   Signed by:   Katina Dung, RN, BSN on 01/16/2011   Method used:   Electronically to        [facility] (retail)       [phone]       [place], [place]  [address]       Ph: [phone]       Fax: [phone]   RxID:   [id number]    Orders Added: 1)  Echocardiogram [Echo] 2)  TLB-BMP (Basic Metabolic Panel-BMET) [80048-METABOL] 3)  TLB-BNP (B-Natriuretic Peptide) [83880-BNPR] 4)  TLB-CBC Platelet - w/Differential  [85025-CBCD] 5)  TLB-PT (Protime) [85610-PTP] 6)  Cardiac Catheterization [Cardiac Cath]      Appended Document: Per Dr. Ladona Ridgel and Dr. Lucie Leather discussion on 01/15/11.  Pat...    Clinical Lists Changes  Medications: Changed medication from FUROSEMIDE 20 MG TABS (FUROSEMIDE) one daily to FUROSEMIDE 40 MG TABS (FUROSEMIDE) one daily - Signed Changed medication from KLOR-CON M10 10 MEQ CR-TABS (POTASSIUM CHLORIDE CRYS CR) one daily to KLOR-CON M20 20 MEQ CR-TABS (POTASSIUM CHLORIDE CRYS CR) one daily - Signed Rx of KLOR-CON M20 20 MEQ CR-TABS (POTASSIUM CHLORIDE CRYS CR) one daily;  #30 x 6;  Signed;  Entered by: Katina Dung, RN, BSN;  Authorized by: Marca Ancona, MD;  Method used: Electronically to [facility] [phone] [place], [place], [place]  [address], Ph: [phone], Fax: [phone] Rx of FUROSEMIDE 40 MG TABS (FUROSEMIDE) one daily;  #30 x 6;  Signed;  Entered by: Katina Dung, RN, BSN;  Authorized by: Marca Ancona, MD;  Method used: Electronically to [facility] [phone] [place], [place], [place]  [address], Ph: [phone], Fax: [phone] Observations: Added new observation of PI CARDIO: Your physician has recommended you make the following change in your medication:   Start Lasix(furosemide) 40mg  daily.  Start KCL(potassium) 20 mEq daily.  Lab today--BMP/BNP/CBC/PT 786.09  428.0  Your physician has requested that you have an echocardiogram.  Echocardiography is a painless test that uses sound waves to create images of your heart. It provides your doctor with information about the size and shape of your heart and how well your heart's chambers and valves are working.  This procedure takes approximately one hour. There are no restrictions for this procedure. YOU HAD THIS TODAY.   Your physician has requested that you have a cardiac catheterization.  Cardiac catheterization is used to diagnose  and/or treat various heart conditions. Doctors may recommend this procedure for a number of different reasons. The most common reason is to evaluate chest pain. Chest pain can be a symptom of coronary artery disease (CAD), and cardiac catheterization can show whether plaque is narrowing or blocking  your heart's arteries. This procedure is also used to evaluate the valves, as well as measure the blood flow and oxygen levels in different parts of your heart.  For further information please visit [email or web address]  Please follow instruction sheet, as given. Langley Gauss [phone]   Your physician recommends that you schedule a follow-up appointment in: 2 weeks with Dr Shirlee Latch. (01/16/2011 12:58)    Prescriptions: FUROSEMIDE 40 MG TABS (FUROSEMIDE) one daily  #30 x 6   Entered by:   Katina Dung, RN, BSN   Authorized by:   Marca Ancona, MD   Signed by:   Katina Dung, RN, BSN on 01/16/2011   Method used:   Electronically to        [facility] (retail)       [phone]       [place], [place]  [address]       Ph: [phone]       Fax: [phone]   RxID:   [id number] KLOR-CON M20 20 MEQ CR-TABS (POTASSIUM CHLORIDE CRYS CR) one daily  #30 x 6   Entered by:   Katina Dung, RN, BSN   Authorized by:   Marca Ancona, MD   Signed by:   Katina Dung, RN, BSN on 01/16/2011   Method used:   Electronically to        [facility] (retail)       [phone]       [place], [place]  [address]       Ph: [phone]       Fax: [phone]   RxID:   [id number]     Patient Instructions: 1)  Your physician has recommended you make the following change in your medication:  2)  Start Lasix(furosemide) 40mg  daily. 3)  Start KCL(potassium) 20 mEq daily. 4)  Lab today--BMP/BNP/CBC/PT 786.09  428.0 5)  Your physician has requested that you have an echocardiogram.  Echocardiography is a painless  test that uses sound waves to create images of your heart. It provides your doctor with information about the size and shape of your heart and how well your heart's chambers and valves are working.  This procedure takes approximately one hour. There are no restrictions for this procedure. YOU HAD THIS TODAY. 6)  Your physician has requested that you have a cardiac catheterization.  Cardiac catheterization is used to diagnose and/or treat various heart conditions. Doctors may recommend this procedure for a number of different reasons. The most common reason is to evaluate chest pain. Chest pain can be a symptom of coronary artery disease (CAD), and cardiac catheterization can show whether plaque is narrowing or blocking your heart's arteries. This procedure is also used to evaluate the valves, as well as measure the blood flow and oxygen levels in different parts of your heart.  For further information please visit [email or web address]  Please follow instruction sheet, as given. 7)  FRIDAY FEBRUARY 3,2012 8)  Your physician recommends that you schedule a follow-up appointment in: 2 weeks with Dr Shirlee Latch.

## 2011-01-22 NOTE — Letter (Signed)
Summary: Cardiac Catheterization Instructions- Main Lab  [facility], Main Office  [address]   [place], [place] [address]   Phone: [phone]  Fax: [phone]     01/16/2011 MRN: [id number]  Wayne [name] [address] [place], [place]  [address]  [place]  Dear Mr. PURNELL,   You are scheduled for Cardiac Catheterization on  Friday February 3,2012              with Dr. Marca Ancona.  Please arrive at the [facility] at 6      a.m. on the day of your procedure.  1. DIET     __x__ Nothing to eat or drink after midnight except your medications with a sip of water.     2. MAKE SURE YOU TAKE YOUR ASPIRIN.  3. ___x__ DO NOT TAKE these medications before your procedure:         DO NOT TAKE METFORMIN TONIGHT AND FOR 48 HOURS.      __x__ YOU MAY TAKE ALL of your remaining medications with a small amount of water.        4. Plan for one night stay - bring personal belongings (i.e. toothpaste, toothbrush, etc.)  5. Bring a current list of your medications and current insurance cards.  6. Must have a responsible person to drive you home.   7. Someone must be with yu for the first 24 hours after you arrive home.  8. Please wear clothes that are easy to get on and off and wear slip-on shoes.  *Special note: Every effort is made to have your procedure done on time.  Occasionally there are emergencies that present themselves at the hospital that may cause delays.  Please be patient if a delay does occur.  If you have any questions after you get home, please call the office at the number listed above.  Katina Dung, RN, BSN

## 2011-01-22 NOTE — Progress Notes (Signed)
Summary:  Pt says stress test needs to be done asap,not next wk  Phone Note Call from Patient Call back at Home Phone [phone]   Caller: spouse - Rubin Payor Summary of Call: Pts stress test is sch for next Mon 01/20/11 at 2pm. Pt says he can not wait this long. Something has to be done now.  Pls call.  Initial call taken by: Lucy Antigua,  January 14, 2011 9:54 AM  Follow-up for Phone Call        See if we can get this moved up sooner. Follow-up by: Evelena Peat MD,  January 14, 2011 10:32 AM  Additional Follow-up for Phone Call Additional follow up Details #1::        [facility] could not get pt in sooner.  Per Dr. Caryl Never - set up consult appt for 2/2 @ 11.30a w/ Dr. Shirlee Latch.   Pt aware. Additional Follow-up by: Corky Mull,  January 15, 2011 1:20 PM

## 2011-01-22 NOTE — Assessment & Plan Note (Signed)
Summary: stomach pain/pressure/bloating/cjr   Vital Signs:  Patient profile:   70 year old male Weight:      250 pounds O2 Sat:      94 % on Room air Temp:     98.4 degrees F oral BP sitting:   124 / 80  (left arm) Cuff size:   regular  Vitals Entered By: Duard Brady LPN (January 13, 2011 2:44 PM)  O2 Flow:  Room air CC: c/o stamach pain, SOB , gas and bloating Is Patient Diabetic? Yes Did you bring your meter with you today? No   History of Present Illness: Patient is seen with onset about one week ago of some increased gas and increased bloated feeling. Symptoms are fairly diffuse. Normal bowel movements. Questionably exacerbated by dairy products. No history of lactose intolerance. Denies any nausea or vomiting. Appetite fair. No clear exertional symptoms. No chest pain. Denies any pleuritic pain, hemoptysis, or cough. No fever or chills.  No alleviating factors. No classic GERD symptoms.  Wife states that he has had some episodes of intermittent dyspnea without chest pain, including  in shower earlier today.  Some increase in fatigue.  On one episode she noted he was diaporetic. No pleuritic pain or hemoptysis.  Symptoms of dyspnea with no clear precipitating factors.  Preventive Screening-Counseling & Management  Alcohol-Tobacco     Smoking Status: quit     Year Quit: 1990  Allergies: 1)  Lipitor (Atorvastatin Calcium)  Past History:  Past Medical History: Last updated: 06/20/2009 Arthritis Prostate cancer Diabetes Hyperlipidemia Hypertension  Past Surgical History: Last updated: 06/20/2009 Prostate cancer, seed inplant  Family History: Last updated: 06/20/2009 Family history lung cancer Family History Hypertension  Social History: Last updated: 06/20/2009 Occupation:  Truck driver Married Past smoker, quit  Risk Factors: Exercise: no (01/28/2010)  Risk Factors: Smoking Status: quit (01/13/2011) PMH-FH-SH reviewed for relevance  Review  of Systems       The patient complains of weight gain.  The patient denies anorexia, fever, weight loss, chest pain, syncope, peripheral edema, prolonged cough, headaches, hemoptysis, melena, hematochezia, and severe indigestion/heartburn.    Physical Exam  General:  Well-developed,well-nourished,in no acute distress; alert,appropriate and cooperative throughout examination Mouth:  Oral mucosa and oropharynx without lesions or exudates.  Teeth in good repair. Neck:  No deformities, masses, or tenderness noted. Chest Wall:  No deformities, masses, tenderness or gynecomastia noted. Lungs:  Normal respiratory effort, chest expands symmetrically. Lungs are clear to auscultation, no crackles or wheezes. Heart:  Normal rate and regular rhythm. S1 and S2 normal without gallop, murmur, click, rub or other extra sounds. Abdomen:  soft, non-tender, normal bowel sounds, no masses, no guarding, no rigidity, no rebound tenderness, no abdominal hernia, no hepatomegaly, and no splenomegaly.   Extremities:  No clubbing, cyanosis, edema, or deformity noted with normal full range of motion of all joints.   Neurologic:  alert & oriented X3, cranial nerves II-XII intact, and strength normal in all extremities.   Skin:  no rashes.   Cervical Nodes:  No lymphadenopathy noted Psych:  Oriented X3, normally interactive, and good eye contact.     Impression & Recommendations:  Problem # 1:  DYSPNEA (ICD-786.05) ?etiology.  No evid for infectious source.  No hx lung disease.  Nonsmoker.  Nonspecific St-T changes on EKG. Check CXR.  Set up stress test. Orders: EKG w/ Interpretation (93000) Cardiolite (Cardiolite) T-2 View CXR (71020TC)  Problem # 2:  ABDOMINAL PAIN, UNSPECIFIED SITE (ICD-789.00) No evid for obstruction.  ?  gaseous.  Complete Medication List: 1)  Lisinopril 10 Mg Tabs (Lisinopril) .... One by mouth daily 2)  Centrum Silver Ultra Mens Tabs (Multiple vitamins-minerals) .... Once daily 3)   Metformin Hcl 1000 Mg Tabs (Metformin hcl) .... One tab two times a day 4)  Triamcinolone Acetonide 0.5 % Crea (Triamcinolone acetonide) .... Apply to affected rash two times a day as needed 5)  Onetouch Ultra Test Strp (Glucose blood) .... Test daily 6)  Simvastatin 20 Mg Tabs (Simvastatin) .... Once daily  Patient Instructions: 1)  No significant exertional symptoms until followup. 2)  Follow up immediately if you have any chest pain or progressive shortness of breath 3)  Baby aspirin daily as tolerated   Orders Added: 1)  EKG w/ Interpretation [93000] 2)  Cardiolite [Cardiolite] 3)  T-2 View CXR [71020TC] 4)  Est. Patient Level IV [[address]]

## 2011-01-23 ENCOUNTER — Encounter (HOSPITAL_COMMUNITY): Payer: Self-pay

## 2011-01-24 ENCOUNTER — Encounter: Payer: Self-pay | Admitting: Cardiology

## 2011-01-26 NOTE — Procedures (Signed)
NAMETRUEMAN, WORLDS NO.:  [id number]  MEDICAL RECORD NO.:  [id number]           PATIENT TYPE:  I  LOCATION:  2922                         FACILITY:  [facility]  PHYSICIAN:  Marca Ancona, MD      DATE OF BIRTH:  1941/10/10  DATE OF PROCEDURE:  01/17/2011 DATE OF DISCHARGE:                           CARDIAC CATHETERIZATION   PROCEDURES: 1. Left heart catheterization. 2. Right heart catheterization. 3. Coronary angiography.  INDICATIONS:  This is a 70 year old who has had severe shortness of breath with exertion for the last 7 days.  Echocardiogram showed his EF to be about 20%.  We took him to cath lab today to assess for coronary disease as a cause of his cardiomyopathy.  PROCEDURE NOTE:  After informed consent was obtained, the right groin was sterilely prepped and draped, 1% Lidocaine was used locally to anesthetize the right groin area.  The right common femoral vein was entered using modified Seldinger technique and a 7-French venous sheath was placed.  Right common femoral artery was entered using modified Seldinger's technique and a 5-French arterial sheath was placed.  The right heart catheterization was carried out using balloon-tip Swan-Ganz catheter.  Samples were removed for oxygen saturation from the pulmonary artery and the femoral artery.  The left coronary artery was engaged using the multipurpose catheter.  The left ventricle was entered using the multipurpose catheter, and the right coronary artery was engaged using the JR-4 catheter and no complications.  FINDINGS: 1. Hemodynamics:  Mean right atrial pressure 12 mmHg, RV 39/10, mean     pulmonary capillary wedge pressure is 26 mmHg, PA 40/25 with a mean     PA pressure of 32 mmHg, LV 94/21, aorta 86/60, cardiac output 4.28     liters per minute, cardiac index 2.1.  SVR is 982. 2. Left ventriculography was not done.  We do know from echo yesterday     that his EF was in the 20% range. 3.  Right coronary artery.  The right coronary artery is subtotally     occluded proximally.  There is some faint filling of the mid RCA.     There are also faint right-to-right collaterals noted as well.  The     PDA fills by collaterals from the left system. 4. Left main.  Left main has luminal irregularities.  No stenosis more     than about 20%. 5. Left circumflex system.  There is a moderate ramus that has a 90%     proximal stenosis and the AV circumflex itself is a small to     moderate sized vessel and has a 95% proximal stenosis.  The AV     circumflex gives rise to several small obtuse marginals. 6. LAD system.  The LAD has a 40-50% non-flow limiting proximal     stenosis.  There is a small to moderate first diagonal with about     80% ostial stenosis.  There are several other small to moderate     diagonals.  The rest of the LAD has only luminal irregularities.  IMPRESSION:  This patient has  elevated left and right heart filling pressures.  He has a known ejection fraction of about 20%.  His right coronary artery is totally occluded with collaterals.  There is a 90% stenosis in the moderate ramus and 95% stenosis in the relatively small circumflex.  The left main and the left anterior descending do not have flow-limiting disease.  We will plan on treating his heart failure by diuresing and we will get a cardiac MRI for viability.  If his inferior wall is viable, we could possibly open his chronic total occlusion of the right.  The ramus also could be an interventional target.     Marca Ancona, MD     DM/MEDQ  D:  01/17/2011  T:  01/17/2011  Job:  [id number]  cc:   Evelena Peat, M.D.  Electronically Signed by Marca Ancona MD on 01/23/2011 08:43:13 AM

## 2011-01-27 ENCOUNTER — Encounter (INDEPENDENT_AMBULATORY_CARE_PROVIDER_SITE_OTHER): Payer: Medicare Other | Admitting: Cardiology

## 2011-01-27 ENCOUNTER — Other Ambulatory Visit: Payer: Self-pay | Admitting: Cardiology

## 2011-01-27 ENCOUNTER — Encounter: Payer: Self-pay | Admitting: Cardiology

## 2011-01-27 DIAGNOSIS — I5022 Chronic systolic (congestive) heart failure: Secondary | ICD-10-CM

## 2011-01-27 DIAGNOSIS — I2510 Atherosclerotic heart disease of native coronary artery without angina pectoris: Secondary | ICD-10-CM | POA: Insufficient documentation

## 2011-01-27 DIAGNOSIS — R0609 Other forms of dyspnea: Secondary | ICD-10-CM

## 2011-01-27 LAB — CBC WITH DIFFERENTIAL/PLATELET
Basophils Absolute: 0 10*3/uL (ref 0.0–0.1)
Basophils Relative: 0.3 % (ref 0.0–3.0)
Eosinophils Absolute: 0.4 10*3/uL (ref 0.0–0.7)
HCT: 49.7 % (ref 39.0–52.0)
Hemoglobin: 16.4 g/dL (ref 13.0–17.0)
Lymphs Abs: 1.6 10*3/uL (ref 0.7–4.0)
MCHC: 33 g/dL (ref 30.0–36.0)
MCV: 87.5 fl (ref 78.0–100.0)
Neutro Abs: 5.9 10*3/uL (ref 1.4–7.7)
RBC: 5.68 Mil/uL (ref 4.22–5.81)
RDW: 13.9 % (ref 11.5–14.6)

## 2011-01-27 LAB — BASIC METABOLIC PANEL
BUN: 18 mg/dL (ref 6–23)
CO2: 28 mEq/L (ref 19–32)
Chloride: 97 mEq/L (ref 96–112)
Glucose, Bld: 97 mg/dL (ref 70–99)
Potassium: 5.5 mEq/L — ABNORMAL HIGH (ref 3.5–5.1)
Sodium: 138 mEq/L (ref 135–145)

## 2011-01-28 ENCOUNTER — Ambulatory Visit (HOSPITAL_COMMUNITY)
Admission: RE | Admit: 2011-01-28 | Discharge: 2011-01-28 | Disposition: A | Discharge status: Home / Self Care | Payer: Medicare Other | Source: Ambulatory Visit | Attending: Cardiology | Admitting: Cardiology

## 2011-01-28 ENCOUNTER — Telehealth: Payer: Self-pay | Admitting: Cardiology

## 2011-01-28 DIAGNOSIS — I2510 Atherosclerotic heart disease of native coronary artery without angina pectoris: Secondary | ICD-10-CM

## 2011-01-28 DIAGNOSIS — I428 Other cardiomyopathies: Secondary | ICD-10-CM | POA: Insufficient documentation

## 2011-01-28 DIAGNOSIS — R079 Chest pain, unspecified: Secondary | ICD-10-CM | POA: Insufficient documentation

## 2011-01-28 MED ORDER — GADOPENTETATE DIMEGLUMINE 469.01 MG/ML IV SOLN
49.0000 mL | Freq: Once | INTRAVENOUS | Status: AC
  Administered 2011-01-28: 49 mL via INTRAVENOUS

## 2011-01-30 ENCOUNTER — Observation Stay (HOSPITAL_COMMUNITY)
Admission: RE | Admit: 2011-01-30 | Discharge: 2011-01-31 | Disposition: A | Discharge status: Home / Self Care | Payer: Medicare Other | Source: Ambulatory Visit | Attending: Cardiovascular Disease | Admitting: Cardiovascular Disease

## 2011-01-30 ENCOUNTER — Inpatient Hospital Stay (HOSPITAL_BASED_OUTPATIENT_CLINIC_OR_DEPARTMENT_OTHER): Admission: RE | Admit: 2011-01-30 | Payer: Medicare Other | Source: Ambulatory Visit | Admitting: Cardiovascular Disease

## 2011-01-30 DIAGNOSIS — I2510 Atherosclerotic heart disease of native coronary artery without angina pectoris: Principal | ICD-10-CM | POA: Insufficient documentation

## 2011-01-30 DIAGNOSIS — I2589 Other forms of chronic ischemic heart disease: Secondary | ICD-10-CM | POA: Insufficient documentation

## 2011-01-30 DIAGNOSIS — E669 Obesity, unspecified: Secondary | ICD-10-CM | POA: Insufficient documentation

## 2011-01-30 DIAGNOSIS — E119 Type 2 diabetes mellitus without complications: Secondary | ICD-10-CM | POA: Insufficient documentation

## 2011-01-30 DIAGNOSIS — I5022 Chronic systolic (congestive) heart failure: Secondary | ICD-10-CM | POA: Insufficient documentation

## 2011-01-30 DIAGNOSIS — K625 Hemorrhage of anus and rectum: Secondary | ICD-10-CM | POA: Insufficient documentation

## 2011-01-30 DIAGNOSIS — E785 Hyperlipidemia, unspecified: Secondary | ICD-10-CM | POA: Insufficient documentation

## 2011-01-30 DIAGNOSIS — I10 Essential (primary) hypertension: Secondary | ICD-10-CM | POA: Insufficient documentation

## 2011-01-30 LAB — GLUCOSE, CAPILLARY
Glucose-Capillary: 172 mg/dL — ABNORMAL HIGH (ref 70–99)
Glucose-Capillary: 96 mg/dL (ref 70–99)
Glucose-Capillary: 163 mg/dL — ABNORMAL HIGH (ref 70–99)
Glucose-Capillary: 119 mg/dL — ABNORMAL HIGH (ref 70–99)

## 2011-01-30 LAB — CBC
HCT: 39.8 % (ref 39.0–52.0)
Hemoglobin: 13.1 g/dL (ref 13.0–17.0)
MCH: 28.3 pg (ref 26.0–34.0)
MCHC: 32.9 g/dL (ref 30.0–36.0)
MCV: 86 fL (ref 78.0–100.0)
Platelets: 196 10*3/uL (ref 150–400)
RBC: 4.63 MIL/uL (ref 4.22–5.81)
RDW: 13.4 % (ref 11.5–15.5)
WBC: 7.6 10*3/uL (ref 4.0–10.5)

## 2011-01-30 LAB — BASIC METABOLIC PANEL
CO2: 25 mEq/L (ref 19–32)
Calcium: 9.6 mg/dL (ref 8.4–10.5)
GFR calc Af Amer: >60 mL/min (ref >60)
GFR calc non Af Amer: >60 mL/min (ref >60)
Glucose, Bld: 152 mg/dL — ABNORMAL HIGH (ref 70–99)
Potassium: 4.7 mEq/L (ref 3.5–5.1)
Sodium: 140 mEq/L (ref 135–145)

## 2011-01-30 LAB — POCT ACTIVATED CLOTTING TIME: Activated Clotting Time: 487 seconds

## 2011-01-30 NOTE — Letter (Signed)
Summary: Appointment - Cardiac MRI  [facility], Main Office  [address]   [place], [place] [address]   Phone: [phone]  Fax: [phone]      January 22, 2011 MRN: [id number]   Wayne [name] [address] [place], [place]  [address]   Dear Mr. HASSELL,   We have scheduled the above patient for an appointment for a Cardiac MRI on 01-28-2011 at 10:00 a.m.  Please refer to the below information for the location and instructions for this test:  Location:     [facility]       [address]       [place], [place]  [address] Instructions:    Wilmon Arms at [facility] Outpatient Registration 45 minutes prior to your appointment time.  This will ensure you are in the Radiology Department 30 minutes prior to your appointment.    There are no restrictions for this test you may eat and take medications as usual.  If you need to reschedule this appointment please call at the number listed above.  Sincerely,      Lorne Skeens [facility] Scheduling Team

## 2011-01-30 NOTE — Discharge Summary (Signed)
Wayne [name], Wayne [name] NO.:  [id number]  MEDICAL RECORD NO.:  [id number]           PATIENT TYPE:  I  LOCATION:  4705                         FACILITY:  [facility]  PHYSICIAN:  Luis Abed, MD, FACCDATE OF BIRTH:  12-Apr-1941  DATE OF ADMISSION:  01/17/2011 DATE OF DISCHARGE:  01/19/2011                              DISCHARGE SUMMARY   PRIMARY CARDIOLOGIST:  Marca Ancona, MD  DISCHARGE DIAGNOSES: 1. Acute/chronic systolic heart failure. 2. Ischemic cardiomyopathy.     a.     EF 20%, by cardiac catheterization (EF 15%, by 2-D echo,      with diffuse HK; inferior AK). 3. Severe two-vessel coronary artery disease.     a.     By elective cardiac catheterization, this admission.  SECONDARY DIAGNOSES: 1. Hypertension. 2. Dyslipidemia. 3. Type 2 diabetes mellitus. 4. Obesity.  REASON FOR ADMISSION:  The patient is a 70 year old male, with multiple cardiac risk factors, but no prior history of coronary artery disease, who presented to Dr. Shirlee Latch in the office, for evaluation of exertional dyspnea.  Arrangements were made to proceed with elective cardiac catheterization, scheduled for the following day.  HOSPITAL COURSE:  The procedure was performed by Dr. Shirlee Latch, who noted 100% occlusion of the proximal right coronary artery with distal collaterals; 90% Ramus intermedius stenosis; and 95%, small circumflex artery stenosis.  There was no flow limiting disease of either the left main or left anterior descending artery.  Estimated ejection fraction  approximately 20%, with elevated left/right heart filling pressures.  The patient was admitted for aggressive diuretic management, and placed on IV Lasix.  He was also started on Aldactone, carvedilol, and digoxin, in addition to full-dose aspirin and Plavix.  Dr. Shirlee Latch indicated that the patient would need a cardiac MRI scan, for assessment of viability.  This will be arranged as an outpatient.  The patient  remained hemodynamically stable during his short course, with no complaint of chest pain.  He was cleared for discharge on hospital day #2, in hemodynamically stable condition.  His weight at time of discharge was 106 kg, compared to 107.5, on admission.  DISCHARGE LABS:  Sodium 137, potassium 3.9, BUN 20, creatinine 0.9, and glucose 120.  BNP 436.  OUTSTANDING LABS:  Normal CBC, on admission.  BNP 509, on admission.  Followup chest x-ray, February 3:  Resolution of bibasilar atelectasis and pleural effusions; cardiomegaly, without CHF.  DISPOSITION:  Stable.  FOLLOWUP: 1. Dr. Marca Ancona in 7-10 days, arrangements to be made through our     office. 2. We will arrange for scheduling of a cardiac MRI scan.  DISCHARGE INSTRUCTIONS:  The patient is instructed to stop taking lisinopril, simvastatin, low-dose aspirin, and supplemental potassium  DISCHARGE MEDICATIONS:  Aspirin 325 mg daily, Plavix 75 mg daily, carvedilol 3.125 mg b.i.d., digoxin 0.25 mg daily, glipizide 5 mg b.i.d., lisinopril 5 mg b.i.d., Crestor 40 mg q.h.s., spironolactone 25 mg daily, furosemide 40 mg daily, metformin 1000 mg b.i.d., and Nitrostat 0.4 mg daily.  DURATION OF DISCHARGE ENCOUNTER:  Greater than 30 minutes, including physician time.     Gene Serpe,  PA-C   ______________________________ Luis Abed, MD, [facility]    GS/MEDQ  D:  01/19/2011  T:  01/20/2011  Job:  [id number]  cc:   Evelena Peat, M.D.  Electronically Signed by Rozell Searing PA-C on 01/21/2011 10:43:25 AM Electronically Signed by Willa Rough MD FACC on 01/30/2011 12:43:04 PM

## 2011-01-30 NOTE — Miscellaneous (Signed)
  Clinical Lists Changes  Problems: Added new problem of CHEST PAIN UNSPECIFIED (ICD-786.50) Orders: Added new Referral order of Cardiac MRI (Cardiac MRI) - Signed  Appended Document:  Patient needs cardiac MRI to assess for viability in RCA territory to be done soon.  He also needs followup with me in 1 week.   Appended Document: schedule cardiac MRI discussed with pt --he is aware he will be scheduled for cardiac MRI--I have scheduled appt with Dr Shirlee Latch for 01/27/11   Clinical Lists Changes  Problems: Added new problem of CARDIOMYOPATHY (ICD-425.4) Orders: Added new Referral order of Cardiac MRI (Cardiac MRI) - Signed

## 2011-01-31 LAB — BASIC METABOLIC PANEL
BUN: 13 mg/dL (ref 6–23)
CO2: 29 mEq/L (ref 19–32)
Calcium: 9 mg/dL (ref 8.4–10.5)
Chloride: 107 mEq/L (ref 96–112)
Creatinine, Ser: 0.93 mg/dL (ref 0.4–1.5)
Glucose, Bld: 108 mg/dL — ABNORMAL HIGH (ref 70–99)

## 2011-01-31 LAB — CBC
MCH: 26.8 pg (ref 26.0–34.0)
MCHC: 31.2 g/dL (ref 30.0–36.0)
MCV: 86 fL (ref 78.0–100.0)
Platelets: 184 10*3/uL (ref 150–400)
RDW: 13.3 % (ref 11.5–15.5)

## 2011-01-31 LAB — PLATELET INHIBITION P2Y12: P2Y12 % Inhibition: 60 %

## 2011-02-05 NOTE — Progress Notes (Signed)
Summary: pt calling re question on med  Phone Note Call from Patient   Caller: Patient [phone] Reason for Call: Talk to Nurse Summary of Call: pt was told to stop taking a fluid pill-on so much med can't remember which one to stop Initial call taken by: Glynda Jaeger,  January 28, 2011 4:10 PM  Follow-up for Phone Call        I told pt he could hold furosemide the AM of cath. He verbalized understanding of instructions regarding change in spironolactone yesterday and also instructions regarding metformin prior to procedure. Follow-up by: Dossie Arbour, RN, BSN,  January 28, 2011 4:28 PM

## 2011-02-05 NOTE — Letter (Signed)
Summary: Cardiac Catheterization Instructions- Main Lab  [facility], Main Office  [address]   [place], [place] [address]   Phone: [phone]  Fax: [phone]     01/27/2011 MRN: [id number]  Wayne [name] [address] [place], [place]  [address]  [place]  Dear Mr. LAGRAND,   You are scheduled for Cardiac Catheterization on Thursday February 16,2012 with Dr Excell Seltzer.  Please arrive at the [facility] at 7       a.m. on the day of your procedure.  1. DIET     __x__ Nothing to eat or drink after midnight except your medications with a sip of water.   2. MAKE SURE YOU TAKE YOUR ASPIRIN.  3. __x___ DO NOT TAKE these medications before your procedure:        DO NOT TAKE METFORMIN WEDNESDAY NIGHT FEBRUARY 01,0272 AND FOR 48 HOURS.      __x__ YOU MAY TAKE ALL of your remaining medications with a small amount of water.        4. Plan for one night stay - bring personal belongings (i.e. toothpaste, toothbrush, etc.)  5. Bring a current list of your medications and current insurance cards.  6. Must have a responsible person to drive you home.   7. Someone must be with you for the first 24 hours after you arrive home.  8. Please wear clothes that are easy to get on and off and wear slip-on shoes.  *Special note: Every effort is made to have your procedure done on time.  Occasionally there are emergencies that present themselves at the hospital that may cause delays.  Please be patient if a delay does occur.  If you have any questions after you get home, please call the office at the number listed above.  Katina Dung, RN, BSN

## 2011-02-05 NOTE — Assessment & Plan Note (Signed)
Summary: FYI: 2-8- spk with pt wife aware of appt for cardiac mri on  ...   Primary Provider:  Dr. Caryl Never  CC:  follow up/Pt feeling fatigued and "just dont feel good"/ pt medicaitons too expensive as pt does not have Rx coverage.  Marland Kitchen  History of Present Illness: 70 yo with history of diabetes, HTN, and hyperlipidemia presented initially for evaluation of exertional dyspnea.  This had been new over the week prior to the first appointment.  Patient was short of breath with mild exertion, such as walking about 200 feet, and would have to stop.  He had some orthopnea.  He never had chest pain.  CXR was done on 1/30, showing small bilateral effusions and bibasilar airspace disease concerning for pulmonary edema, so he was referred to cardiology.   When I initially saw him, he looked quite volume overloaded.  I started him on cardiac meds including Lasix and cathed him the next day.  This showed a subtotalled proximal RCA with some left to right collaterals, 90% proximal ramus, 95% proximal CFX, and no flow limiting stenosis in the LAD.  EF was 20% by echo.  He was admitted for a couple of days, diuresed, and sent home with plan for cardiac MRI to assess viability in the RCA distribution.  He has not yet had his cardiac MRI due to some scheduling issues, this has been set up for tomorrow.    Since discharge, he has been feeling a lot better.  Exertional dyspnea and orthopnea have both resolved, though he has not been very active.  No chest pain.  Weight is down 13 lbs.   ECG: NSR, incomplete LBBB, anterolateral T wave inversions  Labs (12/11): LDL 143 (started simvastatin), HDL 40 Labs (2/12): BNP 809 => 436, K 3.9, creatinine 0.9, HCT 44  Current Medications (verified): 1)  Lisinopril 5 Mg Tabs (Lisinopril) .... One Twice A Day 2)  Centrum Silver Ultra Mens  Tabs (Multiple Vitamins-Minerals) .... Once Daily 3)  Metformin Hcl 1000 Mg Tabs (Metformin Hcl) .... One Tab Two Times A Day 4)  Onetouch  Ultra Test  Strp (Glucose Blood) .... Test Daily 5)  Aspirin 81 Mg Tbec (Aspirin) .... One Daily 6)  Furosemide 40 Mg Tabs (Furosemide) .... One Daily 7)  Plavix 75 Mg Tabs (Clopidogrel Bisulfate) .... Take One Tablet Once Daily-Pt Has Only Gotten 10 Pills Filled. 8)  Crestor 40 Mg Tabs (Rosuvastatin Calcium) .... Take One Tablet Once Daily 9)  Carvedilol 3.125 Mg Tabs (Carvedilol) .... Take One Tablet Two Times A Day 10)  Digoxin 0.125 Mg Tabs (Digoxin) .... One Daily 11)  Nitrostat 0.4 Mg Subl (Nitroglycerin) .... As Needed Uad 12)  Spironolactone 25 Mg Tabs (Spironolactone) .... One Daily 13)  Coreg 6.25 Mg Tabs (Carvedilol) .... One Twice A Day  Allergies (verified): 1)  Lipitor (Atorvastatin Calcium)  Past History:  Past Medical History: 1. HYPERTENSION  2. HYPERLIPIDEMIA  3. ECZEMA 4. RHINITIS  5. HERPES ZOSTER OPHTHALMICUS 6. ADENOCARCINOMA, PROSTATE: Status post prostatectomy in 2009.  Has had some incontinence since then.  7. Diabetes mellitus type II 8. Arthritis 9. Obesity 10.  GERD: rare 11.  CAD: Presented with exertional dyspnea, never had chest pain.  LHC (2/12) with subtotalled proximal RCA and left to right collaterals, 90% proximal moderate-sized ramus, 95% proximal relatively small CFX, 40-50% proximal LAD.  12.  Ischemic CMP: Echo (2/12) with moderately dilated LV, EF about 20% with diffuse hypokinesis and inferior akinesis, pseudonormal diastolic function, mild MR,  severe LAE, mildly decreased RV systolic function.  RHC (2/12) with mean RA 12, PA 40/25, mean PCWP 26, CI 2.1  Family History: Reviewed history from 01/16/2011 and no changes required. Father died with MI at age 78.  He was an alcoholic.  Mother died with cancer at around 99.   Social History: Reviewed history from 01/16/2011 and no changes required. Occupation:  retired Naval architect Married, lives in [place] Past smoker, quit around 1990  Review of Systems       All systems reviewed and  negative except as per HPI.   Vital Signs:  Patient profile:   70 year old male Height:      70 inches Weight:      233 pounds BMI:     33.55 Pulse rate:   83 / minute Pulse rhythm:   regular BP sitting:   118 / 74  (left arm) Cuff size:   regular  Vitals Entered By: Judithe Modest CMA (January 27, 2011 12:25 PM)  Physical Exam  General:  Well developed, well nourished, in no acute distress.  Obese.  Neck:  Neck supple, no JVD. No masses, thyromegaly or abnormal cervical nodes. Lungs:  Clear bilaterally to auscultation and percussion. Heart:  Non-displaced PMI, chest non-tender; regular rate and rhythm, S1, S2 without murmurs, rubs or gallops. No carotid bruit.  Pedals normal pulses. Trace ankle edema.  Abdomen:  Bowel sounds positive; abdomen soft and non-tender without masses, organomegaly, or hernias noted. No hepatosplenomegaly. Extremities:  No clubbing or cyanosis. Neurologic:  Alert and oriented x 3. Psych:  Normal affect.   Impression & Recommendations:  Problem # 1:  CARDIOMYOPATHY (ICD-425.4) Ischemic cardiomyopathy (at least primarily).  Volume status is much improved since hospitalization.  No JVD and minimal edema today.  He denies any further exertional dyspnea but has not been very active.  - Continue spironolactone and lisinopril 5 mg two times a day.  Need to get BMET/BNP to followup on K and creatinine.  - Decrease digoxin to 0.125 mg daily - Increase Coreg to 6.25 mg  two times a day.  - Continue current Lasix dose.  - Repeat echo in 3 months after planned PCI this week; if EF is still depressed, will need ICD.   Problem # 2:  CORONARY ATHEROSCLEROSIS NATIVE CORONARY ARTERY (ICD-414.01) Severe CAD with subtotal RCA occlusion and significant proximal ramus and CFX stenoses.  We had planned to get a cardiac MRI to assess for viability in the RCA territory.  However, this has been postponed until tomorrow by scheduling issues.  The ramus is a moderate vessel and  I think should be intervened upon.  The CFX is relatively small but I think this could be a target as well.  If there is inferior viability, would consider opening chronic total occlusion of the RCA.  However, if the inferior wall is scarred on MRI, would proceed with ramus +/- CFX.  Will plan to do this procedure later this week, tentatively Thursday.  Will discuss procedure with Dr. Excell Seltzer who will be the interventionalist that day. Continue ASA (can decrease to 81 mg daily), Plavix, and high-dose Crestor.  Will work on getting him medication assistance from the manufacturers for Plavix and Crestor.  He will need cardiac rehab after procedure.   Problem # 3:  HYPERLIPIDEMIA (ICD-272.4)  Lipids/LFTs in 2 months, goal LDL < 70.   The following medications were removed from the medication list:    Simvastatin 20 Mg Tabs (Simvastatin) ..... Once daily  His updated medication list for this problem includes:    Crestor 40 Mg Tabs (Rosuvastatin calcium) .Marland Kitchen... Take one tablet once daily  Other Orders: TLB-BMP (Basic Metabolic Panel-BMET) (80048-METABOL) TLB-BNP (B-Natriuretic Peptide) (83880-BNPR) TLB-CBC Platelet - w/Differential (85025-CBCD) TLB-PT (Protime) (85610-PTP) Cardiac Catheterization (Cardiac Cath)  Patient Instructions: 1)  Your physician has recommended you make the following change in your medication:  2)  Decrease Digoxin to 0.125mg  daily--you can take one-half of a 0.25mg  tablet daily. 3)  Increase Coreg(carvedilol) to 6.25mg  twice a day--you can take two 3.125mg  tablets twice a day. 4)  Decrease Aspirin to 81mg  daily--this should coated. 5)  Your physician recommends that you have lab today---BMP/BNP/CBC/PT  414.01 428.22 6)  Your physician has recommended that you have a heart catheterization.  After this is performed, and if a blockage is found, an angioplasty or stenting procedure may be necessary.  Please see the handout/brochure given to you today for more information about  these procedures.  Kathi Ludwig [phone] 7)  Your physician recommends that you schedule a follow-up appointment in: 2-3 weeks with Dr Shirlee Latch. Prescriptions: DIGOXIN 0.125 MG TABS (DIGOXIN) one daily  #30 x 6   Entered by:   Katina Dung, RN, BSN   Authorized by:   Marca Ancona, MD   Signed by:   Katina Dung, RN, BSN on 01/27/2011   Method used:   Electronically to        [facility] (retail)       [phone]       [place], [place]  [address]       Ph: [phone]       Fax: [phone]   RxID:   [id number] COREG 6.25 MG TABS (CARVEDILOL) one twice a day  #60 x 6   Entered by:   Katina Dung, RN, BSN   Authorized by:   Marca Ancona, MD   Signed by:   Katina Dung, RN, BSN on 01/27/2011   Method used:   Electronically to        [facility] (retail)       [phone]       [place], [place]  [address]       Ph: [phone]       Fax: [phone]   RxID:   [id number] DIGOXIN 0.125 MG TABS (DIGOXIN) one daily  #30 x 6   Entered by:   Katina Dung, RN, BSN   Authorized by:   Marca Ancona, MD   Signed by:   Katina Dung, RN, BSN on 01/27/2011   Method used:   Print then Give to Patient   RxID:   [id number] COREG 6.25 MG TABS (CARVEDILOL) one twice a day  #60 x 6   Entered by:   Katina Dung, RN, BSN   Authorized by:   Marca Ancona, MD   Signed by:   Katina Dung, RN, BSN on 01/27/2011   Method used:   Print then Give to Patient   RxID:   [id number] CRESTOR 40 MG TABS (ROSUVASTATIN CALCIUM) take one tablet once daily  #90 x 3   Entered by:   Katina Dung, RN, BSN   Authorized by:   Marca Ancona, MD   Signed by:   Katina Dung, RN, BSN on 01/27/2011   Method used:   Print then Give to Patient   RxID:   [id number]

## 2011-02-07 NOTE — Discharge Summary (Signed)
NAMEPARDEEP, PAUTZ NO.:  [id number]  MEDICAL RECORD NO.:  [id number]           PATIENT TYPE:  I  LOCATION:  6527                         FACILITY:  [facility]  PHYSICIAN:  Marca Ancona, MD      DATE OF BIRTH:  [birth year]  DATE OF ADMISSION:  01/30/2011 DATE OF DISCHARGE:  01/31/2011                              DISCHARGE SUMMARY   PRIMARY CARDIOLOGIST:  Marca Ancona, MD  PRIMARY CARE PROVIDER:  Evelena Peat, MD  GASTROENTEROLOGIST:  Shirley Friar, MD  DISCHARGE DIAGNOSES:  Severe two-vessel coronary artery disease status post successful complex intervention of a chronic total occlusion of the proximal right coronary artery using multiple overlapping drug-eluting stents.  Status post successful percutaneous coronary intervention of the ramus intermediate branch using a single drug-eluting stent.  SECONDARY DIAGNOSES: 1. Ischemic cardiomyopathy, ejection fraction 20% per cardiac     catheterization January 17, 2011. 2. Chronic systolic heart failure, EF 20%. 3. Hypertension. 4. Dyslipidemia. 5. Type 2 diabetes mellitus. 6. Obesity.  PROCEDURES/DIAGNOSTICS PERFORMED DURING HOSPITALIZATION:  Left heart catheterization. A.  Percutaneous transluminal coronary angioplasty and stenting of the right coronary artery using a 2.5 x 28 mm, 2.75 x 28 mm, 3.0 x 28 mm, 3.0 x 28 mm, and a 3.0 x 24 mm Promus Element drug-eluting stents.  TIMI 0-3. B.  Successful percutaneous coronary intervention of the ramus intermedius branch using a single 3.0 x 16 mm Promus Element drug- eluting stent, TIMI 3-3 pre and post.  REASON FOR HOSPITALIZATION:  This is a 70 year old gentleman with recent cardiac catheterization on January 17, 2011, who was found to have chronic total occlusion of the proximal right coronary artery with some left-to-right collaterals as well as a high-grade stenosis of the ramus intermedius and left circumflex.  After review of the films by  Dr. Shirlee Latch and Dr. Excell Seltzer as well as a cardiac MRI to assess for viability, he was noted to have no significant disease in the LAD or diagonal branches.  In order to improve the patient's cardiomyopathy, percutaneous intervention of the chronic total occlusion of the RCA as well as high-grade ramus was planned.  Risks, benefits, and indications were discussed with the patient, he agreed to proceed.  HOSPITAL COURSE:  The patient was brought to the cardiac cath lab by Dr. Excell Seltzer, informed consent was obtained.  As above, Dr. Excell Seltzer performed successful complex intervention of a chronic total occlusion of the proximal right coronary artery using multiple overlapping drug-eluting stents.  There was also successful percutaneous coronary intervention of the ramus intermedius branch using a single drug-eluting stent.  The patient tolerated the procedure well and was taken for overnight observation.  It was noted that the patient should be continued on dual antiplatelet therapy.  With the patient requiring multivessel stenting as well as his diabetes, the patient's Plavix was changed to Effient.  During the evening, the patient did experience bright red blood per rectum.  The nurse took out the patient out of bed to the bathroom and noticed a small amount of blood on the bedpan with few small clots. After the patient's bowel  movement, the wife states that there was a large amount of blood in the toilet, although nursing staff was unable to assess secondary to the patient flushing the toilet.  The patient also admitted to a nosebleed prior to admission.  A CBC was obtained after the episode demonstrating hemoglobin of 13.1 and a hematocrit of 39.8.  The patient's vitals remained stable.  He had no further episodes.  Per Dr. Shirlee Latch, the patient is to follow up with his GI physician for further evaluation at this time.  On the day of discharge, Dr. Shirlee Latch evaluated the patient and noted  him stable for home with close followup.  He did have a small ecchymosis of his left groin site, no bruit noted.  The patient denied any chest pain or shortness of breath and was able to ambulate without difficulty. Again, he had no further episodes of bleeding.  Discharge plans and instructions were discussed with the patient and he voiced understanding.  DISCHARGE LABORATORY DATA:  WBC 7.1, hemoglobin 12.8, hematocrit 41, platelet 184.  PRU 86, percent inhibition 60%.  Sodium 142, potassium 4.8, BUN 13, creatinine 0.93.  DISCHARGE MEDICATIONS: 1. Lisinopril 10 mg daily. 2. Centrum Silver Ultra Men's 1 tablet daily. 3. Metformin 1000 mg 1 tablet twice daily, this will be held for 48     hours. 4. Aspirin 81 mg daily. 5. Lasix 40 mg daily. 6. Simvastatin 20 mg daily. 7. Carvedilol 6.25 mg twice daily. 8. Digoxin 0.125 mg daily. 9. Nitrostat 0.4 mg 1 tablet sublingual as onset of chest pain, may     repeat every 5 minutes up to 3 doses. 10.Spironolactone 25 mg 1 tablet daily. 11.Effient 10 mg daily. 12.Potassium chloride 20 mEq 1 tablet daily.  FOLLOWUP PLANS AND APPOINTMENTS: 1. The patient will follow up with [name], physician assistant     for Dr. Shirlee Latch on February 10, 2011, at 11:00 a.m. 2. The patient will follow up with Dr. Bosie Clos on February 18, 2011, at     1:15. 3. The patient will follow up with Dr. Caryl Never as previously     scheduled or as needed. 4. The patient will increase activity slowly.  He may shower but no     bathing.  No lifting for 1 week greater than 5 pounds.  No driving     for 2 days.  No sexual activity for 1 week.  He is to keep his cath     site clean and dry and call the office for any problems. 5. The patient is to continue low-sodium heart-healthy diet. 6. The patient is to avoid straining and stop any activity that causes     chest pain.  Duration of discharge greater 30 minutes with physician and physician extender  time.     Leonette Monarch, PA-C   ______________________________ Marca Ancona, MD    NB/MEDQ  D:  01/31/2011  T:  01/31/2011  Job:  [id number]  cc:   Evelena Peat, M.D. Shirley Friar, MD  Electronically Signed by Alen Blew P.A. on 02/05/2011 10:15:47 AM Electronically Signed by Marca Ancona MD on 02/07/2011 10:38:12 AM

## 2011-02-10 ENCOUNTER — Encounter (INDEPENDENT_AMBULATORY_CARE_PROVIDER_SITE_OTHER): Payer: Medicare Other | Admitting: Physician Assistant

## 2011-02-10 ENCOUNTER — Encounter: Payer: Self-pay | Admitting: Physician Assistant

## 2011-02-10 ENCOUNTER — Other Ambulatory Visit: Payer: Self-pay | Admitting: Physician Assistant

## 2011-02-10 DIAGNOSIS — K921 Melena: Secondary | ICD-10-CM

## 2011-02-10 DIAGNOSIS — M76899 Other specified enthesopathies of unspecified lower limb, excluding foot: Secondary | ICD-10-CM | POA: Insufficient documentation

## 2011-02-10 DIAGNOSIS — I2589 Other forms of chronic ischemic heart disease: Secondary | ICD-10-CM

## 2011-02-10 DIAGNOSIS — I10 Essential (primary) hypertension: Secondary | ICD-10-CM

## 2011-02-10 DIAGNOSIS — I2510 Atherosclerotic heart disease of native coronary artery without angina pectoris: Secondary | ICD-10-CM

## 2011-02-10 LAB — BASIC METABOLIC PANEL
BUN: 20 mg/dL (ref 6–23)
CO2: 28 mEq/L (ref 19–32)
Calcium: 10.6 mg/dL — ABNORMAL HIGH (ref 8.4–10.5)
Creatinine, Ser: 0.9 mg/dL (ref 0.4–1.5)
GFR: 88.26 mL/min (ref >60.00)
Glucose, Bld: 106 mg/dL — ABNORMAL HIGH (ref 70–99)
Sodium: 140 mEq/L (ref 135–145)

## 2011-02-10 LAB — CBC WITH DIFFERENTIAL/PLATELET
Basophils Absolute: 0 10*3/uL (ref 0.0–0.1)
Eosinophils Absolute: 0.4 10*3/uL (ref 0.0–0.7)
Hemoglobin: 15.5 g/dL (ref 13.0–17.0)
Lymphocytes Relative: 19.5 % (ref 12.0–46.0)
MCHC: 33.2 g/dL (ref 30.0–36.0)
Neutro Abs: 5.9 10*3/uL (ref 1.4–7.7)
Platelets: 255 10*3/uL (ref 150.0–400.0)
RDW: 13.5 % (ref 11.5–14.6)

## 2011-02-13 ENCOUNTER — Telehealth: Payer: Self-pay | Admitting: Cardiology

## 2011-02-13 ENCOUNTER — Encounter: Payer: Self-pay | Admitting: Cardiology

## 2011-02-14 ENCOUNTER — Encounter: Payer: Self-pay | Admitting: Cardiology

## 2011-02-17 ENCOUNTER — Other Ambulatory Visit: Payer: Self-pay

## 2011-02-17 ENCOUNTER — Encounter (HOSPITAL_COMMUNITY): Payer: Medicare Other | Attending: Cardiology

## 2011-02-17 DIAGNOSIS — I10 Essential (primary) hypertension: Secondary | ICD-10-CM | POA: Insufficient documentation

## 2011-02-17 DIAGNOSIS — C61 Malignant neoplasm of prostate: Secondary | ICD-10-CM | POA: Insufficient documentation

## 2011-02-17 DIAGNOSIS — E785 Hyperlipidemia, unspecified: Secondary | ICD-10-CM | POA: Insufficient documentation

## 2011-02-17 DIAGNOSIS — Z5189 Encounter for other specified aftercare: Secondary | ICD-10-CM | POA: Insufficient documentation

## 2011-02-17 DIAGNOSIS — R079 Chest pain, unspecified: Secondary | ICD-10-CM | POA: Insufficient documentation

## 2011-02-17 DIAGNOSIS — I2510 Atherosclerotic heart disease of native coronary artery without angina pectoris: Secondary | ICD-10-CM | POA: Insufficient documentation

## 2011-02-17 DIAGNOSIS — I428 Other cardiomyopathies: Secondary | ICD-10-CM | POA: Insufficient documentation

## 2011-02-17 DIAGNOSIS — E119 Type 2 diabetes mellitus without complications: Secondary | ICD-10-CM | POA: Insufficient documentation

## 2011-02-19 ENCOUNTER — Other Ambulatory Visit (INDEPENDENT_AMBULATORY_CARE_PROVIDER_SITE_OTHER): Payer: Medicare Other

## 2011-02-19 ENCOUNTER — Encounter: Payer: Self-pay | Admitting: Cardiology

## 2011-02-19 ENCOUNTER — Other Ambulatory Visit: Payer: Self-pay

## 2011-02-19 ENCOUNTER — Other Ambulatory Visit: Payer: Self-pay | Admitting: Cardiology

## 2011-02-19 ENCOUNTER — Encounter (HOSPITAL_COMMUNITY): Payer: Medicare Other

## 2011-02-19 DIAGNOSIS — I2510 Atherosclerotic heart disease of native coronary artery without angina pectoris: Secondary | ICD-10-CM

## 2011-02-19 DIAGNOSIS — I428 Other cardiomyopathies: Secondary | ICD-10-CM

## 2011-02-19 LAB — BASIC METABOLIC PANEL
CO2: 26 mEq/L (ref 19–32)
Calcium: 9.3 mg/dL (ref 8.4–10.5)
Creatinine, Ser: 0.9 mg/dL (ref 0.4–1.5)
Glucose, Bld: 143 mg/dL — ABNORMAL HIGH (ref 70–99)

## 2011-02-19 LAB — GLUCOSE, CAPILLARY: Glucose-Capillary: 118 mg/dL — ABNORMAL HIGH (ref 70–99)

## 2011-02-20 NOTE — Progress Notes (Signed)
Summary: rehab class aproval  Phone Note Call from Patient Call back at Home Phone [phone]   Caller: Spouse/Mrs. Batson Reason for Call: Talk to Nurse Summary of Call: pt wife states dr needs to aprove rehab class. rehab # [phone] Initial call taken by: Roe Coombs,  February 13, 2011 12:02 PM     Appended Document: rehab class aproval send in paperwork for rehab when it is available please.   Appended Document: rehab class aproval I faxed orders and wife is aware --wife states rehab called today and states they have all the paperwork they need for him to enroll  Appended Document: rehab class aproval Dr Shirlee Latch received notes from pharmacist at [facility] noting pt mentioned nose bleed and bleeding per rectum---I discussed this with pt today--pt states he had bleeding per rectum in the hospital but has not noticed any since DC from hospital--he does have an appt scheduled with Dr Bosie Clos  to followup on this--pt states he has occ minor nose bleeds--nothing that he cannot manage-he will call me if this changes--pt is aware of the importance of continuing Effient and Aspirin

## 2011-02-20 NOTE — Procedures (Signed)
NAME:  Wayne [name], Wayne [name]                  ACCOUNT NO.:  [id number]  MEDICAL RECORD NO.:  [id number]           PATIENT TYPE:  I  LOCATION:  6527                         FACILITY:  [facility]  PHYSICIAN:  Veverly Fells. Excell Seltzer, MD  DATE OF BIRTH:  [birth year]  DATE OF PROCEDURE:  01/30/2011 DATE OF DISCHARGE:                           CARDIAC CATHETERIZATION   PROCEDURES: 1. Percutaneous transluminal coronary angioplasty and stenting of the     right coronary artery. 2. Percutaneous transluminal coronary angioplasty and stenting of the     ramus intermedius. 3. Perclose of the right femoral artery.  PROCEDURAL INDICATION:  Wayne [name] is a 70 year old gentleman with an ischemic cardiomyopathy.  He underwent diagnostic catheterization by Wayne [name] and was found to have a chronic total occlusion of the proximal right coronary artery with some left-to-right collaterals.  There was also high-grade stenosis of the ramus intermedius and left circumflex. Wayne [name] and I carefully reviewed the patient's films.  He underwent a cardiac MRI to assess for viability and he did not have any areas of transmural scar.  He also did not have significant disease in the LAD or diagonal branches.  We decided the best course of treatment to try to improve his cardiomyopathy was attempted percutaneous intervention of his chronic total occlusion of the right coronary artery and treatment of his high-grade ramus stenosis.  He also has a subtotally occluded AV groove circumflex, which supplies a small amount of myocardium and we decided to treat that artery medically.  Risks and indications of procedure were reviewed with the patient. Informed consent was obtained.  The right groin was prepped, draped and anesthetized with 1% lidocaine.  Using the modified Seldinger technique, a 6-French sheath was placed in the right femoral artery.  The patient had been adequately preloaded with Plavix.  Angiomax was started  for anticoagulation.  A JR-4 guide catheter was inserted.  There was pressure dampening with the guide catheter in place.  Once the therapeutic ACT was achieved, I was able to advance a 3 g Miracle Brothers guidewire across the occlusion into the distal right coronary artery.  This was done with a moderate amount of difficulty.  I then passed a 1.5 x 12-mm Mini-Trek balloon into the right coronary artery, but it would not cross the lesion.  I did inflate the balloon twice into the nose of the lesion up to 12 atmospheres, but I could not get it push across.  I then used a 1.5 x 15-mm apex push balloon, which was successful in crossing the lesion again with a moderate amount of difficulty.  The balloon was inflated to 12 atmospheres over four inflations, so that the entire mid and proximal vessel was predilated adequately.  Angiography demonstrated recanalization of the right coronary artery with severe diffuse disease.  At that point, a 2.0 x 20- mm balloon was advanced and crossed into the mid-right coronary artery and it was inflated multiple times to 14 atmospheres maximum pressure, total of four more inflations were done.  This improved flow into the artery, but I was now able to see that there was severe  diffuse disease all the way into the distal right coronary artery.  A 2.5 x 30-mm apex balloon was advanced and four inflations were done up to 10 atmospheres on each inflation, so that the entire vessel was covered.  It was clear that the entire right coronary artery would require stenting because of severe diffuse disease.  I had previously been hopeful that we could treat only the midvessel but the entire vessel was severely diseased. Distally, 2.5 x 28-mm Promus Element drug-eluting stent was advanced, this was advanced over a Cougar wire, which then placed into the RCA as a second wire.  The Miracle Brothers wire was pulled out.  The Promus Element stent was taken to the distal  vessel just at the bifurcation of the PDA and posterior AV segment and it was deployed at 14 atmospheres. I then stented the vessel in overlapping fashion all the way back to the proximal occlusion.  A 2.75 x 28-mm stent was deployed at 14 atmospheres, a 3.0 x 28-mm Promus Element stent was deployed at 14 atmospheres, another 3.0 x 28-mm Promus Element drug-eluting stent was deployed at 16 atmospheres and finally a 3.0 x 24-mm Promus Element stent was deployed at 16 atmospheres.  The entire stented segment was then postdilated.  Distally, a 3.0 x 25-mm Walker Trek balloon was used, it was taken to 16 and then 18 atmospheres for a total of two inflations for the mid and proximal stent with 3.5 x 25-mm [place] Trek balloon was used and it was taken to 16 atmospheres on multiple inflations.  The entire stented segment was treated.  Final angiography demonstrated an excellent result with 0% residual stenosis and TIMI 3 flow.  Attention was then turned to the ramus intermedius.  An XB 3.5-cm guide catheter was used.  Initial angiography confirmed a severe 95% proximal vessel stenosis.  A Cougar guidewire was advanced beyond the stenosis. A 2.5 x 12-mm apex balloon was used to predilate the lesion to 10 atmospheres.  The vessel was stented with a 3.0 x 16-mm Promus element drug-eluting stent, which was deployed 12 atmospheres.  A 3.25 x 12-mm [place] Trek balloon was used for postdilatation and was taken to 14 atmospheres.  There was an excellent angiographic result with 0% residual stenosis.  Finally, a Perclose device was used for femoral hemostasis.  Femoral angiography was performed and it was difficult to tell us the arterial sheath insertion site was in the common femoral or just at the bifurcation.  A steeper angle was taken and appeared to be above the bifurcation, so I felt it was safe to close.  FINAL ASSESSMENT: 1. Successful complex intervention of a chronic total occlusion of the      proximal right coronary artery using multiple overlapping drug-     eluting stents. 2. Successful percutaneous coronary intervention of the ramus     intermedius branch using a single drug-eluting stent.  RECOMMENDATIONS:  The patient should be continued on dual antiplatelet therapy.  Since he required multivessel stenting with some of the stents in the right coronary artery, I would favor change him to Prasugrel in the context of his underlying diabetes.     Veverly Fells. Excell Seltzer, MD     MDC/MEDQ  D:  01/30/2011  T:  01/31/2011  Job:  [id number]  cc:   Marca Ancona, MD Evelena Peat, M.D.  Electronically Signed by Tonny Bollman MD on 02/18/2011 09:06:26 PM

## 2011-02-20 NOTE — Assessment & Plan Note (Signed)
Summary: eph/per Lowella Bandy PA called 01/31/11=mj   Visit Type:  Follow-up Primary Provider:  Dr. Caryl Never  CC:  no cardiac complaints.  History of Present Illness: Primary Cardiologist:  Dr. Marca Ancona   Wayne [name] is a 70 yo with history of diabetes, HTN, and hyperlipidemia presented initially for evaluation of exertional dyspnea.  He was treated for CHF.  Cath was done and showed a subtotalled proximal RCA with some left to right collaterals, 90% proximal ramus, 95% proximal CFX, and no flow limiting stenosis in the LAD.  EF was 20% by echo.  He was admitted for a couple of days, diuresed, and sent home with plan for cardiac MRI to assess viability in the RCA distribution.  This did not demonstrate transmural scar and his myocardium was felt to be viable.  He was brought  back for PCI on 01/30/11 and he underwent DES (overlapping) x 5 to the RCA and DEX x 1 to the RI without apparent complication.  His CFX was tx medically as it supplied a small area of myocardium.  His Plavix was changed to Effient due to multiple stents and h/o DM2.  He did have some BRBPR and his hgb remained stable.  He was asked to f/u with GI as an outpt. . . . he sees Dr. Bosie Clos.  He returns for f/u.    He is doing well overall.  He denies chest pain.  Denies shortness of breath.  He denies syncope.  He denies orthopnea, PND or pedal edema.  He's having a lot of discomfort over his left lateral thigh.  He's pointing to his greater trochanter.  It's worse when he carries something in his pocket or walks for prolonged periods of time.  He denies any muscle heaviness or tightness in his legs with exertion that is relieved with rest.   Current Medications (verified): 1)  Lisinopril 5 Mg Tabs (Lisinopril) .... One Twice A Day 2)  Centrum Silver Ultra Mens  Tabs (Multiple Vitamins-Minerals) .... Once Daily 3)  Metformin Hcl 1000 Mg Tabs (Metformin Hcl) .... One Tab Two Times A Day 4)  Onetouch Ultra Test  Strp (Glucose Blood)  .... Test Daily 5)  Aspirin 81 Mg Tbec (Aspirin) .... One Daily 6)  Furosemide 40 Mg Tabs (Furosemide) .... One Daily 7)  Effient 10 Mg Tabs (Prasugrel Hcl) .... Take One Daily 8)  Simvastatin 20 Mg Tabs (Simvastatin) .... Take One Daily 9)  Digoxin 0.125 Mg Tabs (Digoxin) .... One Daily 10)  Nitrostat 0.4 Mg Subl (Nitroglycerin) .... As Needed Uad 11)  Spironolactone 25 Mg Tabs (Spironolactone) .... One Tablet  Daily 12)  Coreg 6.25 Mg Tabs (Carvedilol) .... One Twice A Day  Allergies (verified): 1)  Lipitor (Atorvastatin Calcium)  Past History:  Past Medical History: 1. HYPERTENSION  2. HYPERLIPIDEMIA  3. ECZEMA 4. RHINITIS  5. HERPES ZOSTER OPHTHALMICUS 6. ADENOCARCINOMA, PROSTATE: Status post prostatectomy in 2009.  Has had some incontinence since then.  7. Diabetes mellitus type II 8. Arthritis 9. Obesity 10.  GERD: rare 11.  CAD: Presented with exertional dyspnea, never had chest pain.  LHC (2/12) with subtotalled proximal RCA and left to right collaterals, 90% proximal moderate-sized ramus, 95% proximal relatively small CFX, 40-50% proximal LAD.         a.  DES x 5 (overlapping) to RCA 01/30/11        b.  DES x 1 to RI 01/30/11 12.  Ischemic CMP: Echo (2/12) with moderately dilated LV, EF about 20%  with diffuse hypokinesis and inferior akinesis, pseudonormal diastolic function, mild MR, severe LAE, mildly decreased RV systolic function.  RHC (2/12) with mean RA 12, PA 40/25, mean PCWP 26, CI 2.1  Review of Systems       He denies any bright red blood per rectum.  Otherwise, as per  the HPI.  All other systems reviewed and negative.   Vital Signs:  Patient profile:   70 year old male Height:      70 inches Weight:      231 pounds Pulse rate:   80 / minute Pulse rhythm:   regular BP sitting:   100 / 78  Vitals Entered By: Jacquelin Hawking, CMA (February 10, 2011 11:02 AM)  Physical Exam  General:  Well nourished, well developed, in no acute distress HEENT:  normal Neck: no JVD Cardiac:  normal S1, S2; RRR; no murmur Lungs:  clear to auscultation bilaterally, no wheezing, rhonchi or rales Abd: soft, nontender, no hepatomegaly Ext: no  edema; RFA site without hematoma or bruit Skin: warm and dry Neuro:  CNs 2-12 intact, no focal abnormalities noted MSK: + point tenderness over the greater trochanter   EKG  Procedure date:  02/10/2011  Findings:      normal sinus rhythm Normal axis HR 87 Poor R-wave progression Interventricular conduction delay Nonspecific ST-T wave changes  Impression & Recommendations:  Problem # 1:  CORONARY ATHEROSCLEROSIS NATIVE CORONARY ARTERY (ICD-414.01) He is doing well after intervention of his RCA and ramus intermediate.  He knows the importance of continuing on Effient and aspirin.  He is eager to start cardiac rehabilitation.  He will followup with Dr. Shirlee Latch in 3-4 weeks.  Problem # 2:  CARDIOMYOPATHY (ICD-425.4) He has an ischemic cardiomyopathy.  I suspect his ejection fraction will improve over time with restored perfusion post PCI.  He will followup with Dr. Shirlee Latch in 3-4 weeks.  He will require reassessment of his LV function in the next couple of months.  Orders: EKG w/ Interpretation (93000)  Problem # 3:  HEMATOCHEZIA (ICD-578.1) This was noted in the hospital.  He has not had a recurrence.  He has an appointment with his gastroenterologist next week.  We will obtain a CBC today.  Orders: TLB-CBC Platelet - w/Differential (85025-CBCD)  Problem # 4:  HYPERTENSION (ICD-401.9) Controlled.  He will have a basic metabolic panel obtained today.  Orders: TLB-BMP (Basic Metabolic Panel-BMET) (80048-METABOL)  Problem # 5:  HYPERLIPIDEMIA (ICD-272.4) Followed by primary care. His updated medication list for this problem includes:    Simvastatin 20 Mg Tabs (Simvastatin) .Marland Kitchen... Take one daily  Problem # 6:  BURSITIS, LEFT HIP (ICD-726.5) He can apply ice to this area 2-3 times a day.  I asked  him to followup with his primary care provider.  He may be a candidate for steroid injection.  Patient Instructions: 1)  Your physician recommends that you schedule a follow-up appointment in: 3-4 weeks with Dr. Shirlee Latch as per [name], PA-C. 2)  Your physician recommends that you return for lab work in: Today BMET 401.1, CBC 578.1. 3)  Your physician recommends that you continue on your current medications as directed. Please refer to the Current Medication list given to you today.

## 2011-02-21 ENCOUNTER — Encounter (HOSPITAL_COMMUNITY): Payer: Medicare Other

## 2011-02-24 ENCOUNTER — Ambulatory Visit (INDEPENDENT_AMBULATORY_CARE_PROVIDER_SITE_OTHER): Payer: Medicare Other | Admitting: Family Medicine

## 2011-02-24 ENCOUNTER — Encounter (HOSPITAL_COMMUNITY): Payer: Medicare Other

## 2011-02-24 ENCOUNTER — Encounter: Payer: Self-pay | Admitting: Family Medicine

## 2011-02-24 DIAGNOSIS — I2510 Atherosclerotic heart disease of native coronary artery without angina pectoris: Secondary | ICD-10-CM

## 2011-02-24 DIAGNOSIS — R5381 Other malaise: Secondary | ICD-10-CM

## 2011-02-24 DIAGNOSIS — E119 Type 2 diabetes mellitus without complications: Secondary | ICD-10-CM

## 2011-02-24 DIAGNOSIS — M549 Dorsalgia, unspecified: Secondary | ICD-10-CM

## 2011-02-24 DIAGNOSIS — R5383 Other fatigue: Secondary | ICD-10-CM

## 2011-02-24 DIAGNOSIS — I428 Other cardiomyopathies: Secondary | ICD-10-CM

## 2011-02-24 LAB — HEPATIC FUNCTION PANEL
ALT: 18 U/L (ref 0–53)
AST: 21 U/L (ref 0–37)
Alkaline Phosphatase: 67 U/L (ref 39–117)
Total Bilirubin: 0.8 mg/dL (ref 0.3–1.2)

## 2011-02-24 LAB — TSH: TSH: 1 u[IU]/mL (ref 0.35–5.50)

## 2011-02-24 LAB — SEDIMENTATION RATE: Sed Rate: 16 mm/hr (ref 0–22)

## 2011-02-24 LAB — LIPID PANEL
HDL: 34.4 mg/dL — ABNORMAL LOW (ref >39.00)
Total CHOL/HDL Ratio: 4

## 2011-02-24 LAB — HEMOGLOBIN A1C: Hgb A1c MFr Bld: 7 % — ABNORMAL HIGH (ref 4.6–6.5)

## 2011-02-24 MED ORDER — TRAMADOL HCL 50 MG PO TABS: 50.0000 mg | ORAL_TABLET | Freq: Four times a day (QID) | ORAL | Status: AC | PRN

## 2011-02-24 NOTE — Progress Notes (Signed)
  Subjective:    Patient ID: Wayne [name], male    DOB: 20-Oct-1941, 70 y.o.   MRN: [id number]  HPI  Patient seen for medical followup. Recently presented with dyspnea and further workup revealed coronary artery disease. The patient has scheduled followup with cardiology. Is participating with cardiac rehabilitation.   Type 2 diabetes which is been well controlled. Fasting blood sugars mostly low 100s. Only takes metformin. No symptoms of hyperglycemia.   patient complains of diffuse upper back and trunk pain along with some fatigue over the past several weeks. This preceded his cardiac rehabilitation. He does not think this is exercise related. No radiculopathy symptoms. Deep achy quality of pain which is symmetric upper back and trunk. No upper extremity weakness.    patient denies any chest pains or dyspnea at this time. Medications are reviewed he is compliant with all. Some bruising since starting Effient.   Review of Systems  Constitutional: Positive for fatigue. Negative for fever, chills and appetite change.  HENT: Negative for neck pain and neck stiffness.   Eyes: Negative for visual disturbance.  Respiratory: Negative for cough, shortness of breath and wheezing.   Cardiovascular: Negative for chest pain, palpitations and leg swelling.  Gastrointestinal: Negative for abdominal pain.  Genitourinary: Negative for dysuria.  Neurological: Negative for headaches.  Hematological: Negative for adenopathy.       Objective:   Physical Exam  patient is alert and in no distress  blood pressure 104/60 Eardrums are normal. Oropharynx clear Neck supple no adenopathy Chest clear to auscultation Heart regular rhythm and rate Extremities no edema. No foot lesions  Neuro feet normal sensory function to monofilament.       Assessment & Plan:   #1 type 2 diabetes - well controlled, repeat A1c #2 coronary artery disease. Continue close followup with cardiology #3 upper back and trunk  pain. Rule out polymyalgia rheumatica. Check sedimentation rate. Check TSH. Tramadol for symptomatic relief ,pending lab results

## 2011-02-25 NOTE — Progress Notes (Signed)
Quick Note:  Pt informed ______ 

## 2011-02-25 NOTE — Letter (Signed)
Summary: [facility]  Cardiac Rehab Program   Imported By: Marylou Mccoy 02/14/2011 11:33:16  _____________________________________________________________________  External Attachment:    Type:   Image     Comment:   External Document

## 2011-02-25 NOTE — Letter (Signed)
Summary: [place]: Cardiac Rehab Program  [place]: Cardiac Rehab Program   Imported By: Earl Many 02/21/2011 16:33:59  _____________________________________________________________________  External Attachment:    Type:   Image     Comment:   External Document

## 2011-02-26 ENCOUNTER — Encounter (HOSPITAL_COMMUNITY): Payer: Medicare Other

## 2011-02-26 ENCOUNTER — Other Ambulatory Visit: Payer: Self-pay

## 2011-02-27 LAB — GLUCOSE, CAPILLARY: Glucose-Capillary: 92 mg/dL (ref 70–99)

## 2011-02-28 ENCOUNTER — Encounter (HOSPITAL_COMMUNITY): Payer: Medicare Other

## 2011-03-01 ENCOUNTER — Encounter: Payer: Self-pay | Admitting: Cardiology

## 2011-03-03 ENCOUNTER — Encounter (HOSPITAL_COMMUNITY): Payer: Medicare Other

## 2011-03-04 NOTE — Letter (Signed)
Summary: [place]: Medication Reconciliation List  [place]: Medication Reconciliation List   Imported By: Earl Many 02/21/2011 16:45:28  _____________________________________________________________________  External Attachment:    Type:   Image     Comment:   External Document

## 2011-03-04 NOTE — Letter (Signed)
Summary: [place]: Fax - Cardiac Rehab Orientation  [place]: Fax - Cardiac Rehab Orientation   Imported By: Earl Many 02/21/2011 16:39:09  _____________________________________________________________________  External Attachment:    Type:   Image     Comment:   External Document

## 2011-03-05 ENCOUNTER — Encounter (HOSPITAL_COMMUNITY): Payer: Medicare Other

## 2011-03-07 ENCOUNTER — Encounter (HOSPITAL_COMMUNITY): Payer: Medicare Other

## 2011-03-10 ENCOUNTER — Encounter (HOSPITAL_COMMUNITY): Payer: Medicare Other

## 2011-03-12 ENCOUNTER — Other Ambulatory Visit: Payer: Self-pay | Admitting: Cardiology

## 2011-03-12 ENCOUNTER — Encounter (HOSPITAL_COMMUNITY): Payer: Medicare Other

## 2011-03-12 ENCOUNTER — Telehealth: Payer: Self-pay | Admitting: Cardiology

## 2011-03-12 ENCOUNTER — Ambulatory Visit (INDEPENDENT_AMBULATORY_CARE_PROVIDER_SITE_OTHER): Payer: Medicare Other | Admitting: Cardiology

## 2011-03-12 ENCOUNTER — Encounter: Payer: Self-pay | Admitting: Cardiology

## 2011-03-12 DIAGNOSIS — I428 Other cardiomyopathies: Secondary | ICD-10-CM

## 2011-03-12 DIAGNOSIS — R0602 Shortness of breath: Secondary | ICD-10-CM

## 2011-03-12 DIAGNOSIS — I2510 Atherosclerotic heart disease of native coronary artery without angina pectoris: Secondary | ICD-10-CM

## 2011-03-12 DIAGNOSIS — E785 Hyperlipidemia, unspecified: Secondary | ICD-10-CM

## 2011-03-12 MED ORDER — LISINOPRIL 5 MG PO TABS: ORAL_TABLET | ORAL | Status: DC

## 2011-03-12 NOTE — Telephone Encounter (Signed)
Pt was seen this am and forgot to tell Dr Shirlee Latch that he has Blood in his urine wants to know what he should do.

## 2011-03-12 NOTE — Assessment & Plan Note (Signed)
LDL close to goal (< 70).  I think that his back pain is more likely to be related to exercise at cardiac rehab than statin myalgias.

## 2011-03-12 NOTE — Patient Instructions (Signed)
Increase Lisinopril to 5mg  twice a day.  Lab in 2 weeks--BMP/BNP  425.4  401.9  272.4  Echocardiogram in 2 weeks.  425.4  Appointment with Dr Shirlee Latch in 1 month.

## 2011-03-12 NOTE — Telephone Encounter (Signed)
Per Dr Daphine Deutscher in urine probably from Effient--pt needs to continue Effient. Pt should notify Dr Sander Radon of blood in urine. Pt does not need echo in 2 weeks as previously ordered. Pt should have echo end of May 2012.

## 2011-03-12 NOTE — Progress Notes (Signed)
Patient ID: Wayne [name], male    DOB: [birth year], 70 y.o.   MRN: [id number]  HPI 70 yo with history of DM, HTN, CAD, and ischemic cardiomyopathy presents for cardiology followup.  Patient had a CHF exacerbation in 2/12 and was found to have LV systolic dysfunction with EF around 20%.  LHC showed RCA, ramus, and CFX disease.  RCA was subtotally occluded.  Cardiac MRI showed that all walls, including the inferior wall, should be viable.  Patient therefore underwent opening of his chronic totally occluded RCA as well as PCI to the ramus.  He received drug eluting stents and is now on Effient.  Patient's exercise tolerance has been much improved with PCI and medical treatment.  He does not have dyspnea when walking on flat ground.  He is slightly short of breath when walking up a hill.  He has been going to cardiac rehab.  He had one episode of very strong chest pain that only lasted 1-2 seconds.  He was not doing anything in particular.  He has not had exertional chest pain.  Weight is down 4 lbs since last appointment with [name].  He does report some soreness in his upper back.  He also has been bruising easily.   ECG: NSR, nonspecific anterolateral T wave inversions  Labs (3/12): TSH normal, ESR normal, HDL 34, LDL 77, LFTs normal, K 4.3, creatinine 0.9  Past Medical History: 1. HYPERTENSION  2. HYPERLIPIDEMIA  3. ECZEMA 4. RHINITIS  5. HERPES ZOSTER OPHTHALMICUS 6. ADENOCARCINOMA, PROSTATE: Status post prostatectomy in 2009.  Has had some incontinence since then.  7. Diabetes mellitus type II 8. Arthritis 9. Obesity 10.  GERD: rare 11.  CAD: Presented with exertional dyspnea, never had chest pain.  LHC (2/12) with subtotalled proximal RCA and left to right collaterals, 90% proximal moderate-sized ramus, 95% proximal relatively small CFX, 40-50% proximal LAD.  Cardiac MRI (2/12) showed EF 21%, some mild scar in basal segments but all wall segments would be expected to be viable.   a.  DES x 5 (overlapping) to RCA 01/30/11        b.  DES x 1 to RI 01/30/11 12.  Ischemic CMP: Echo (2/12) with moderately dilated LV, EF about 20% with diffuse hypokinesis and inferior akinesis, pseudonormal diastolic function, mild MR, severe LAE, mildly decreased RV systolic function.  RHC (2/12) with mean RA 12, PA 40/25, mean PCWP 26, CI 2.1  Family History: Father died with MI at age 84.  He was an alcoholic.  Mother died with cancer at around 43.   Social History: Occupation:  retired Naval architect Married, lives in [place] Past smoker, quit around 1990  Review of Systems All systems reviewed and negative except as per HPI.   Current Outpatient Prescriptions  Medication Sig Dispense Refill  . aspirin 81 MG tablet Take 81 mg by mouth daily.        . carvedilol (COREG) 6.25 MG tablet Take 6.25 mg by mouth 2 (two) times daily with a meal.        . digoxin (LANOXIN) 0.125 MG tablet Take 125 mcg by mouth daily.        . furosemide (LASIX) 40 MG tablet Take 40 mg by mouth daily.       Marland Kitchen glucose blood test strip 1 each by Other route daily. Use as instructed       . metFORMIN (GLUCOPHAGE) 1000 MG tablet Take 1,000 mg by mouth 2 (two) times daily with a  meal.        . Multiple Vitamins-Minerals (CENTRUM SILVER ULTRA MENS PO) Take 1 tablet by mouth.        . nitroGLYCERIN (NITROSTAT) 0.4 MG SL tablet Place 0.4 mg under the tongue every 5 (five) minutes as needed.        . prasugrel (EFFIENT) 10 MG TABS Take by mouth.        . simvastatin (ZOCOR) 20 MG tablet Take 20 mg by mouth at bedtime.        Marland Kitchen spironolactone (ALDACTONE) 25 MG tablet Take 25 mg by mouth daily. 1/2 (HALF) TAB QD      . DISCONTD: lisinopril (PRINIVIL,ZESTRIL) 5 MG tablet 1/2 tab po bid      . lisinopril (PRINIVIL,ZESTRIL) 5 MG tablet One tablet two times daily  60 tablet  6     Physical Exam BP 108/52  Pulse 77  Resp 12  Ht 5\' 10"  (1.778 m)  Wt 227 lb (102.967 kg)  BMI 32.57 kg/m2 General: NAD, obese.  Neck:  No JVD, no thyromegaly or thyroid nodule.  Lungs: Clear to auscultation bilaterally with normal respiratory effort. CV: Nondisplaced PMI.  Heart regular S1/S2, no S3/S4, no murmur.  1+ ankle edema.  No carotid bruit.  Normal pedal pulses.  Abdomen: Soft, nontender, no hepatosplenomegaly, no distention.   Neurologic: Alert and oriented x 3.  Psych: Normal affect. Extremities: No clubbing or cyanosis.

## 2011-03-12 NOTE — Progress Notes (Signed)
Addended by: Kem Parkinson on: 03/12/2011 04:06 PM   Modules accepted: Orders

## 2011-03-12 NOTE — Assessment & Plan Note (Signed)
Ischemic cardiomyopathy, last EF 21% by MRI.  Symptoms improved with medical treatment and PCI.  NYHA class II symptoms now.  Continue current doses of Lasix (patient appears euvolemic), Coreg, digoxin, spironolactone.  Increase lisinopril today to 5 mg twice a day. BMET/BNP in 2 weeks.  I will have patient get an echo in 5/12 (3 months post-revascularization).  If EF < 35%, will need ICD.  I will see him back in the office in 1 month for med titration.

## 2011-03-12 NOTE — Assessment & Plan Note (Signed)
Patient was found on cardiac MRI to have viability in his inferior wall, so we elected to open his chronically totally occluded RCA.  He now has DES in the RCA and ramus.  He denies ischemic symptoms.  He will need to continue ASA and Effient (these are the cause of his easy bruising).  Continue statin, beta blocker, ACEI.

## 2011-03-12 NOTE — Telephone Encounter (Signed)
I talked with pt.  Pt is aware of Dr Alford Highland recommedations

## 2011-03-13 ENCOUNTER — Other Ambulatory Visit: Payer: Self-pay | Admitting: *Deleted

## 2011-03-13 ENCOUNTER — Telehealth: Payer: Self-pay | Admitting: *Deleted

## 2011-03-13 DIAGNOSIS — I428 Other cardiomyopathies: Secondary | ICD-10-CM

## 2011-03-13 MED ORDER — PRASUGREL HCL 10 MG PO TABS: 10.0000 mg | ORAL_TABLET | Freq: Every day | ORAL | Status: DC

## 2011-03-13 NOTE — Telephone Encounter (Signed)
Per Dr Almon Hercules does not need echo already scheduled for April 9,2012. Pt should have echo done May 2012. I talked with pt by telephone. He verbalized understanding.

## 2011-03-14 ENCOUNTER — Encounter (HOSPITAL_COMMUNITY): Payer: Medicare Other

## 2011-03-17 ENCOUNTER — Encounter (HOSPITAL_COMMUNITY): Payer: Medicare Other | Attending: Cardiology

## 2011-03-17 DIAGNOSIS — E119 Type 2 diabetes mellitus without complications: Secondary | ICD-10-CM | POA: Insufficient documentation

## 2011-03-17 DIAGNOSIS — I428 Other cardiomyopathies: Secondary | ICD-10-CM | POA: Insufficient documentation

## 2011-03-17 DIAGNOSIS — I2510 Atherosclerotic heart disease of native coronary artery without angina pectoris: Secondary | ICD-10-CM | POA: Insufficient documentation

## 2011-03-17 DIAGNOSIS — I10 Essential (primary) hypertension: Secondary | ICD-10-CM | POA: Insufficient documentation

## 2011-03-17 DIAGNOSIS — R079 Chest pain, unspecified: Secondary | ICD-10-CM | POA: Insufficient documentation

## 2011-03-17 DIAGNOSIS — E785 Hyperlipidemia, unspecified: Secondary | ICD-10-CM | POA: Insufficient documentation

## 2011-03-17 DIAGNOSIS — C61 Malignant neoplasm of prostate: Secondary | ICD-10-CM | POA: Insufficient documentation

## 2011-03-17 DIAGNOSIS — Z5189 Encounter for other specified aftercare: Secondary | ICD-10-CM | POA: Insufficient documentation

## 2011-03-19 ENCOUNTER — Encounter (HOSPITAL_COMMUNITY): Payer: Medicare Other

## 2011-03-21 ENCOUNTER — Encounter (HOSPITAL_COMMUNITY): Payer: Medicare Other

## 2011-03-24 ENCOUNTER — Other Ambulatory Visit (HOSPITAL_COMMUNITY): Payer: Medicare Other | Admitting: Radiology

## 2011-03-24 ENCOUNTER — Encounter (HOSPITAL_COMMUNITY): Payer: Medicare Other

## 2011-03-24 ENCOUNTER — Other Ambulatory Visit: Payer: Medicare Other | Admitting: *Deleted

## 2011-03-24 ENCOUNTER — Other Ambulatory Visit (INDEPENDENT_AMBULATORY_CARE_PROVIDER_SITE_OTHER): Payer: Medicare Other | Admitting: *Deleted

## 2011-03-24 DIAGNOSIS — I428 Other cardiomyopathies: Secondary | ICD-10-CM

## 2011-03-24 DIAGNOSIS — R0602 Shortness of breath: Secondary | ICD-10-CM

## 2011-03-24 LAB — BASIC METABOLIC PANEL
CO2: 28 mEq/L (ref 19–32)
Calcium: 9.3 mg/dL (ref 8.4–10.5)
Chloride: 101 mEq/L (ref 96–112)
Sodium: 139 mEq/L (ref 135–145)

## 2011-03-24 LAB — BRAIN NATRIURETIC PEPTIDE: Pro B Natriuretic peptide (BNP): 234.3 pg/mL — ABNORMAL HIGH (ref 0.0–100.0)

## 2011-03-26 ENCOUNTER — Encounter (HOSPITAL_COMMUNITY): Payer: Medicare Other

## 2011-03-28 ENCOUNTER — Encounter (HOSPITAL_COMMUNITY): Payer: Medicare Other

## 2011-03-31 ENCOUNTER — Encounter (HOSPITAL_COMMUNITY): Payer: Medicare Other

## 2011-04-02 ENCOUNTER — Encounter (HOSPITAL_COMMUNITY): Payer: Medicare Other

## 2011-04-04 ENCOUNTER — Other Ambulatory Visit: Payer: Self-pay | Admitting: Cardiology

## 2011-04-04 ENCOUNTER — Encounter (HOSPITAL_COMMUNITY): Payer: Medicare Other

## 2011-04-04 LAB — GLUCOSE, CAPILLARY: Glucose-Capillary: 80 mg/dL (ref 70–99)

## 2011-04-07 ENCOUNTER — Encounter (HOSPITAL_COMMUNITY): Payer: Medicare Other

## 2011-04-09 ENCOUNTER — Encounter (HOSPITAL_COMMUNITY): Payer: Medicare Other

## 2011-04-11 ENCOUNTER — Encounter (HOSPITAL_COMMUNITY): Payer: Medicare Other

## 2011-04-14 ENCOUNTER — Encounter: Payer: Self-pay | Admitting: Cardiology

## 2011-04-14 ENCOUNTER — Ambulatory Visit (INDEPENDENT_AMBULATORY_CARE_PROVIDER_SITE_OTHER): Payer: Medicare Other | Admitting: Cardiology

## 2011-04-14 ENCOUNTER — Encounter (HOSPITAL_COMMUNITY): Payer: Medicare Other

## 2011-04-14 DIAGNOSIS — I2510 Atherosclerotic heart disease of native coronary artery without angina pectoris: Secondary | ICD-10-CM

## 2011-04-14 DIAGNOSIS — I5022 Chronic systolic (congestive) heart failure: Secondary | ICD-10-CM

## 2011-04-14 DIAGNOSIS — I428 Other cardiomyopathies: Secondary | ICD-10-CM

## 2011-04-14 DIAGNOSIS — E785 Hyperlipidemia, unspecified: Secondary | ICD-10-CM

## 2011-04-14 MED ORDER — CARVEDILOL 12.5 MG PO TABS: 12.5000 mg | ORAL_TABLET | Freq: Two times a day (BID) | ORAL | Status: DC

## 2011-04-14 NOTE — Patient Instructions (Addendum)
Increase Coreg to 12.5mg  twice a day. You can take two 6.25mg  tablets twice a day.  Take Coenzyme Q 10 200mg  daily. You do not need a prescription for this.  Lab today--CK 414.01 428.22  Keep the appointment for the echo scheduled for May 14 at  9:30am.  Schedule an appointment to see Dr Shirlee Latch in 2 months.

## 2011-04-15 NOTE — Assessment & Plan Note (Signed)
Pain in neck and arms may be due to cervical OA as it has improved a lot with chiropractic treatment rather than due to statin.  I am going to continue him on current simvastatin dose and will add coenzyme Q10 200 mg daily.  I will get a CPK level.

## 2011-04-15 NOTE — Assessment & Plan Note (Signed)
Ischemic cardiomyopathy, last EF 21% by MRI.  Symptoms improved with medical treatment and PCI.  NYHA class II symptoms now.  Continue current doses of Lasix (patient appears euvolemic), lisinopril, digoxin, spironolactone. Increase Coreg to 12.5 mg bid.  I will have patient get an echo in 5/12 (3 months post-revascularization).  If EF < 35%, will need ICD.  I will see him back in the office in 2 months for med titration.

## 2011-04-15 NOTE — Assessment & Plan Note (Signed)
Patient was found on cardiac MRI to have viability in his inferior wall, so we elected to open his chronically totally occluded RCA.  He now has DES in the RCA and ramus.  He denies ischemic symptoms.  He will need to continue ASA and Effient.  Continue statin, beta blocker, ACEI.

## 2011-04-15 NOTE — Progress Notes (Signed)
70 yo with history of DM, HTN, CAD, and ischemic cardiomyopathy presents for cardiology followup. Patient had a CHF exacerbation in 2/12 and was found to have LV systolic dysfunction with EF around 20%. LHC showed RCA, ramus, and CFX disease. RCA was subtotally occluded. Cardiac MRI showed that all walls, including the inferior wall, should be viable. Patient therefore underwent opening of his chronic totally occluded RCA as well as PCI to the ramus. He received drug eluting stents and is now on Effient. Patient's exercise tolerance has been much improved with PCI and medical treatment. He does not have dyspnea when walking on flat ground. He is slightly short of breath when walking up a hill. He has been going to cardiac rehab. No chest pain. Weight is stable at 227.  Patient reports pain in his neck and arms primarily.  He wonders if this could be due to his statin, but his symptoms have actually been improved by his chiropractor working on his neck.    Labs (3/12): TSH normal, ESR normal, HDL 34, LDL 77, LFTs normal, K 4.3, creatinine 0.9  Labs (4/12): K 4, creatinine 0.9, BNP 234  Past Medical History:  1. HYPERTENSION  2. HYPERLIPIDEMIA  3. ECZEMA  4. RHINITIS  5. HERPES ZOSTER OPHTHALMICUS  6. ADENOCARCINOMA, PROSTATE: Status post prostatectomy in 2009. Has had some incontinence since then.  7. Diabetes mellitus type II  8. Arthritis  9. Obesity  10. GERD: rare  11. CAD: Presented with exertional dyspnea, never had chest pain. LHC (2/12) with subtotalled proximal RCA and left to right collaterals, 90% proximal moderate-sized ramus, 95% proximal relatively small CFX, 40-50% proximal LAD. Cardiac MRI (2/12) showed EF 21%, some mild scar in basal segments but all wall segments would be expected to be viable.        a. DES x 5 (overlapping) to RCA 01/30/11        b. DES x 1 to RI 01/30/11  12. Ischemic CMP: Echo (2/12) with moderately dilated LV, EF about 20% with diffuse hypokinesis and inferior  akinesis, pseudonormal diastolic function, mild MR, severe LAE, mildly decreased RV systolic function. RHC (2/12) with mean RA 12, PA 40/25, mean PCWP 26, CI 2.1  13. Cervical OA.   Family History:  Father died with MI at age 57. He was an alcoholic. Mother died with cancer at around 86.   Social History:  Occupation: retired [employer] architect  Married, lives in [place]  Past smoker, quit around 1990   Review of Systems  All systems reviewed and negative except as per HPI.   Current Outpatient Prescriptions  Medication Sig Dispense Refill  . aspirin 81 MG tablet Take 81 mg by mouth daily.        . digoxin (LANOXIN) 0.125 MG tablet Take 125 mcg by mouth daily.        . furosemide (LASIX) 40 MG tablet Take 40 mg by mouth 2 (two) times daily.       Marland Kitchen glucose blood test strip 1 each by Other route daily. Use as instructed       . lisinopril (PRINIVIL,ZESTRIL) 5 MG tablet One tablet two times daily  60 tablet  6  . metFORMIN (GLUCOPHAGE) 1000 MG tablet Take 1,000 mg by mouth 2 (two) times daily with a meal.        . Multiple Vitamins-Minerals (CENTRUM SILVER ULTRA MENS PO) Take 1 tablet by mouth.        . nitroGLYCERIN (NITROSTAT) 0.4 MG SL tablet Place 0.4  mg under the tongue every 5 (five) minutes as needed.        . prasugrel (EFFIENT) 10 MG TABS Take 1 tablet (10 mg total) by mouth daily.  30 tablet  6  . simvastatin (ZOCOR) 20 MG tablet Take 20 mg by mouth at bedtime.        Marland Kitchen spironolactone (ALDACTONE) 25 MG tablet Take 25 mg by mouth daily. 1/2 (HALF) TAB QD      . carvedilol (COREG) 12.5 MG tablet Take 1 tablet (12.5 mg total) by mouth 2 (two) times daily.  60 tablet  6  . Coenzyme Q10 200 MG TABS Take one daily.    0    BP 123/67  Pulse 95  Resp 18  Ht 5\' 10"  (1.778 m)  Wt 227 lb 12.8 oz (103.329 kg)  BMI 32.69 kg/m2 General: NAD, obese.  Neck: No JVD, no thyromegaly or thyroid nodule.  Lungs: Clear to auscultation bilaterally with normal respiratory effort.  CV:  Nondisplaced PMI. Heart regular S1/S2, no S3/S4, no murmur. No edema. No carotid bruit. Normal pedal pulses.  Abdomen: Soft, nontender, no hepatosplenomegaly, no distention.  Neurologic: Alert and oriented x 3.  Psych: Normal affect.  Extremities: No clubbing or cyanosis.

## 2011-04-16 ENCOUNTER — Encounter (HOSPITAL_COMMUNITY): Payer: Medicare Other | Attending: Cardiology

## 2011-04-16 DIAGNOSIS — I2510 Atherosclerotic heart disease of native coronary artery without angina pectoris: Secondary | ICD-10-CM | POA: Insufficient documentation

## 2011-04-16 DIAGNOSIS — I10 Essential (primary) hypertension: Secondary | ICD-10-CM | POA: Insufficient documentation

## 2011-04-16 DIAGNOSIS — Z5189 Encounter for other specified aftercare: Secondary | ICD-10-CM | POA: Insufficient documentation

## 2011-04-16 DIAGNOSIS — E119 Type 2 diabetes mellitus without complications: Secondary | ICD-10-CM | POA: Insufficient documentation

## 2011-04-16 DIAGNOSIS — E785 Hyperlipidemia, unspecified: Secondary | ICD-10-CM | POA: Insufficient documentation

## 2011-04-16 DIAGNOSIS — R079 Chest pain, unspecified: Secondary | ICD-10-CM | POA: Insufficient documentation

## 2011-04-16 DIAGNOSIS — I428 Other cardiomyopathies: Secondary | ICD-10-CM | POA: Insufficient documentation

## 2011-04-16 DIAGNOSIS — C61 Malignant neoplasm of prostate: Secondary | ICD-10-CM | POA: Insufficient documentation

## 2011-04-18 ENCOUNTER — Encounter (HOSPITAL_COMMUNITY): Payer: Medicare Other

## 2011-04-21 ENCOUNTER — Encounter (HOSPITAL_COMMUNITY): Payer: Medicare Other

## 2011-04-22 ENCOUNTER — Ambulatory Visit: Payer: Medicare Other | Admitting: Family Medicine

## 2011-04-23 ENCOUNTER — Encounter (HOSPITAL_COMMUNITY): Payer: Medicare Other

## 2011-04-25 ENCOUNTER — Encounter (HOSPITAL_COMMUNITY): Payer: Medicare Other

## 2011-04-28 ENCOUNTER — Encounter (HOSPITAL_COMMUNITY): Payer: Medicare Other

## 2011-04-28 ENCOUNTER — Ambulatory Visit (HOSPITAL_COMMUNITY): Payer: Medicare Other | Attending: Cardiology | Admitting: Radiology

## 2011-04-28 ENCOUNTER — Other Ambulatory Visit: Payer: Self-pay | Admitting: Cardiology

## 2011-04-28 DIAGNOSIS — I428 Other cardiomyopathies: Secondary | ICD-10-CM | POA: Insufficient documentation

## 2011-04-28 DIAGNOSIS — I2510 Atherosclerotic heart disease of native coronary artery without angina pectoris: Secondary | ICD-10-CM

## 2011-04-28 LAB — GLUCOSE, CAPILLARY: Glucose-Capillary: 92 mg/dL (ref 70–99)

## 2011-04-29 ENCOUNTER — Ambulatory Visit (INDEPENDENT_AMBULATORY_CARE_PROVIDER_SITE_OTHER): Payer: Medicare Other | Admitting: Family Medicine

## 2011-04-29 ENCOUNTER — Encounter: Payer: Self-pay | Admitting: Family Medicine

## 2011-04-29 VITALS — BP 110/68 | Temp 98.6°F | Wt 227.0 lb

## 2011-04-29 DIAGNOSIS — E119 Type 2 diabetes mellitus without complications: Secondary | ICD-10-CM

## 2011-04-29 LAB — HM DIABETES FOOT EXAM: HM Diabetic Foot Exam: NORMAL

## 2011-04-29 NOTE — Op Note (Signed)
NAMEJAVOHN, BASEY                  ACCOUNT NO.:  [id number]   MEDICAL RECORD NO.:  [id number]          PATIENT TYPE:  AMB   LOCATION:  DAY                          FACILITY:  [facility]   PHYSICIAN:  Almond Lint, MD       DATE OF BIRTH:  1941-06-28   DATE OF PROCEDURE:  09/11/2008  DATE OF DISCHARGE:                               OPERATIVE REPORT   PREOPERATIVE DIAGNOSIS:  Right inguinal hernia.   POSTOPERATIVE DIAGNOSIS:  Right inguinal hernia.   PROCEDURE PERFORMED:  Laparoscopic right inguinal hernia repair.   SURGEON:  Almond Lint, M.D.   ASSISTANT:  Wenda Low, MD.   ANESTHESIA:  General and local.   FINDINGS:  Indirect right inguinal hernia.   SPECIMENS:  None.   ESTIMATED BLOOD LOSS:  25 mL.   PROCEDURE NOTE:  Mr. Lavalley was identified in the holding area and taken  to the operating room where he was placed supine on the operating room  table.  General anesthesia was induced.  Foley catheter was placed and  the arms were tucked.  The abdomen was prepped and draped in sterile  fashion.  Time out was performed confirming the patient, site of  surgery, preoperative antibiotic administration, and the operating room  staff and equipment.  These were correct, so we proceeded.   The infraumbilical skin was anesthetized using a mixture of 1% lidocaine  plain and 0.25% Marcaine with epinephrine.  Transverse incision was made  in the skin, and the subcutaneous tissues were divided bluntly with a  Kelly clamp.  The fascia was elevated and incised longitudinally.  This  was done just to the right of the midline.  The rectus muscle were  retracted laterally, and the balloon dissecting trocar was placed  anterior to the posterior rectus sheath.  The balloon was fully inflated  under direct visualization.  After good dissection had been obtained,  the balloon was removed, and the gas was insufflated to a pressure of 12  mmHg in the preperitoneal space.  A 5-mm trocar was placed in  the  midline and then the right lower quadrant.  These were placed under  direct visualization after administration of local anesthesia.  The  pubis was identified, and the fatty tissues were dissected off of the  anterior abdominal wall.  The epigastric vessels were identified.  The  pubis, as well as the iliopubic tract was dissected free of fatty  attachments until close to the region of the femoral vein.  The femoral  vessels were identified and were avoided.  Attention was then directed  to the spermatic cord and the inguinal ring.  The inguinal ring was  noted to be very dilated with large fatty hernia in place.  This hernia  was reduced proximally while the hernia sac was grasped and dissected  off of the spermatic cord.  It reduced easily.  Dissection was  complicated by difficulty with visualization, and the camera had to be  repositioned multiple times.  However, the cord structures were seen to  be safely unharmed, entering the inguinal ring.  The  lateral aspect also  had the fatty tissues dissected free of the abdominal wall musculature.  A large 3-D mesh patch introduced through the 10-mm balloon trocar.  The  scope had to switched out to a 5 scope in order to position the mesh.  The medial aspect of the mesh was placed at pubic tubercle and tacked  into place along the iliopubic tract and along the anterior abdominal  wall, taking care to avoid the epigastric vessels.  The superior aspect  of the mesh was tacked until we could no longer palpated laterally.  This was not tacked all the way to end in order to avoid tacking the  lateral femoral cutaneous nerve.  The mesh was held in place laterally  while the gas was allowed to evacuate the preperitoneal space.  The  ports were removed under direct visualization and were seen not to be  bleeding.  The anterior fascia was closed using a 0 Vicryl and ER stitch  in a figure-of-eight fashion.  The skin was closed using a 4-0  Monocryl  for all 3 incisions.  They were cleaned and dressed with Dermabond.  The  patient was awakened from anesthesia and brought to the PACU in stable  condition.  Needle and sponge counts were correct times 2.      Almond Lint, MD  Electronically Signed     FB/MEDQ  D:  09/11/2008  T:  09/12/2008  Job:  [id number]

## 2011-04-29 NOTE — Patient Instructions (Signed)
Continue weight loss efforts.  Let's plan repeat A1C in 2 months.

## 2011-04-29 NOTE — Op Note (Signed)
NAME:  Wayne [name], Wayne [name]                  ACCOUNT NO.:  [id number]   MEDICAL RECORD NO.:  [id number]          PATIENT TYPE:  AMB   LOCATION:  [facility]                         FACILITY:  [facility]   PHYSICIAN:  Ronald L. Earlene Plater, M.D.  DATE OF BIRTH:  1941-02-18   DATE OF PROCEDURE:  11/24/2008  DATE OF DISCHARGE:                               OPERATIVE REPORT   DIAGNOSIS:  Adenocarcinoma of the prostate.   OPERATIVE PROCEDURE:  Transperineal implantation of iodine 125 seeds  with a robotic arm Nucletron, cystourethroscopy and placement of Foley  catheter.   SURGEON:  Lucrezia Starch. Earlene Plater, M.D.   ASSISTANT:  Artist Pais. Kathrynn Running, M.D.   ANESTHESIA:  General.   ESTIMATED BLOOD LOSS:  15 mL.   TUBES:  16 French Foley catheter.   COMPLICATIONS:  None.   A total of 26 needles were utilized to implant 84 iodine 125 seeds with  a total apparent activity of 38.5560 mCi.   INDICATIONS FOR PROCEDURE:  Wayne [name] is a very nice 70 year old white  male who presented with an elevated PSA of just over 4.  He underwent  biopsy of the prostate which revealed a Gleason score 6 which was 3 plus  3 adenocarcinoma in several sections and he after considering risks,  benefits and alternatives elected to proceed with seed implantation.  He  has been properly simulated and properly informed.   PROCEDURE IN DETAIL:  The patient was placed in supine position.  After  proper general anesthesia he was placed in the dorsal lithotomy  position, prepped and draped with Betadine in a sterile fashion.  A 16  French Foley catheter was inserted and the bladder was drained and a B  and K biplanar ultrasound probe was placed and the prostate was  localized.  Intraoperative dosimetry was planned and compared with the  preoperative studies and we were comfortable with dosing at the  prostate, the rectum and the urethra.  Holding needles were placed in  unused coordinates.  Utilizing both physical and electronic grid  implantation  was performed.  The first needle was placed to the base  which set the base of the machine.  He subsequently underwent  implantation of 26 needles in preplanned coordinates on the sagittal  scanning for localization.  A total of 84 iodine 125 seeds were placed  with a total apparent activity of 38.5560 mCi.  Following implantation  we were very comfortable with seeds by ultrasound.  All hardware was  removed.  Fluoroscopic localization was performed and again the seeds  appeared to be in excellent location.  The Foley catheter was removed,  scanned for seeds and there were none within it.  Flexible  cystourethroscopy was performed.  There was moderate trilobar  hypertrophy, grade 1 trabeculation was noted of the bladder.  Efflux of  clear urine was noted from the normally placed ureteral orifices  bilaterally and there were no  lesions, seeds or spacers within the bladder or urethra.  The flexible  cystourethroscope was visually removed.  A new 16 French Foley catheter  was inserted.  The bladder  was drained and the patient was taken to the  recovery room stable.      Ronald L. Earlene Plater, M.D.  Electronically Signed     RLD/MEDQ  D:  11/24/2008  T:  11/24/2008  Job:  [id number]

## 2011-04-29 NOTE — Progress Notes (Signed)
  Subjective:    Patient ID: Wayne [name] AGE, male    DOB: Sep 11, 1941, 70 y.o.   MRN: [id number]  HPI Patient seen for medical followup. Cardiac rehabilitation is progressing well. He has made some efforts with weight loss and exercise capacity has improved. Recent followup echo with ejection fraction around 40% which is improved  compared to prior study in February.  Symptomatically greatly improved. No orthopnea. No dizziness. He is inquiring about reducing medications. We explained rationale for each of his medications.  Type 2 diabetes with fair control. Last A1c 7.0%. Not monitoring blood sugars regularly. No symptoms of hyperglycemia. Currently only takes metformin.  Getting yearly eye exams.  No complications of neuropathy, nephropathy, or retinopathy.  Past Medical History  Diagnosis Date  . ADENOCARCINOMA, PROSTATE 06/20/2009  . DM 06/20/2009  . HYPERLIPIDEMIA 06/20/2009  . HYPERTENSION 06/20/2009  . DYSPNEA 01/16/2011  . CORONARY ATHEROSCLEROSIS NATIVE CORONARY ARTERY 01/27/2011  . Herpes zoster ophthalmicus   . Rhinitis   . Arthritis   . Obesity   . GERD (gastroesophageal reflux disease)   . Ischemic cardiomyopathy 2/12    ECHO W/MODERATELY DILATED LV, EF ABOUT 20% W/DIFFUSE HYPOKINESIS AND INFERIOR AKINESIS, PSEUDONORMAL DIASTOLIC FUNCTION, MILD MR, SEVERE LAE, MILDLY DECREASED RV SYTOLIC FUNCTION. RHC (2/12) W/MEAN RA 12, PA 40/25, MEAN PCWP 26, CI 2.1   Past Surgical History  Procedure Date  . Prostate surgery     cancer, seed implant    reports that he quit smoking about 22 years ago. His smoking use included Cigarettes. He has a 6 pack-year smoking history. He does not have any smokeless tobacco history on file. His alcohol and drug histories not on file. family history includes Alcohol abuse in his father; Cancer (age of onset:80) in his mother; and Heart disease (age of onset:61) in his father. Allergies  Allergen Reactions  . Atorvastatin     REACTION: sore legs     Review  of Systems  Constitutional: Negative for fever, chills, appetite change and fatigue.  Respiratory: Negative for cough and wheezing.   Cardiovascular: Negative for chest pain, palpitations and leg swelling.  Gastrointestinal: Negative for abdominal pain and blood in stool.  Genitourinary: Negative for dysuria.  Neurological: Negative for dizziness, syncope and weakness.  Hematological: Negative for adenopathy. Does not bruise/bleed easily.       Objective:   Physical Exam  Constitutional: He is oriented to person, place, and time. He appears well-developed and well-nourished. No distress.  HENT:  Mouth/Throat: Oropharynx is clear and moist. No oropharyngeal exudate.  Neck: Neck supple. No thyromegaly present.  Cardiovascular: Normal rate, regular rhythm and normal heart sounds.   Pulmonary/Chest: Effort normal and breath sounds normal. No respiratory distress. He has no wheezes. He has no rales.  Musculoskeletal: He exhibits no edema.  Lymphadenopathy:    He has no cervical adenopathy.  Neurological: He is alert and oriented to person, place, and time. No cranial nerve deficit.  Psychiatric: He has a normal mood and affect.          Assessment & Plan:  #1 CAD history with systolic failure. Apparently improved by recent echo. Continue close followup with cardiology.  Spent some time explaining to patient and his wife rationale for different medications he is on. #2 type 2 diabetes. Reassess A1c 2 months. If weight loss continues and hopefully reduce metformin

## 2011-04-30 ENCOUNTER — Encounter (HOSPITAL_COMMUNITY): Payer: Medicare Other

## 2011-05-02 ENCOUNTER — Encounter (HOSPITAL_COMMUNITY): Payer: Medicare Other

## 2011-05-02 ENCOUNTER — Other Ambulatory Visit: Payer: Self-pay | Admitting: Cardiology

## 2011-05-05 ENCOUNTER — Encounter (HOSPITAL_COMMUNITY): Payer: Medicare Other

## 2011-05-05 ENCOUNTER — Other Ambulatory Visit: Payer: Self-pay | Admitting: Cardiology

## 2011-05-07 ENCOUNTER — Telehealth: Payer: Self-pay | Admitting: *Deleted

## 2011-05-07 ENCOUNTER — Other Ambulatory Visit: Payer: Self-pay | Admitting: Cardiology

## 2011-05-07 ENCOUNTER — Encounter (HOSPITAL_COMMUNITY): Payer: Medicare Other

## 2011-05-07 DIAGNOSIS — I5022 Chronic systolic (congestive) heart failure: Secondary | ICD-10-CM

## 2011-05-07 DIAGNOSIS — I2510 Atherosclerotic heart disease of native coronary artery without angina pectoris: Secondary | ICD-10-CM

## 2011-05-07 LAB — GLUCOSE, CAPILLARY: Glucose-Capillary: 95 mg/dL (ref 70–99)

## 2011-05-07 NOTE — Telephone Encounter (Signed)
Notes Recorded by Marca Ancona, MD on 05/03/2011 at 10:32 PM Borderline EF on echo (40%). Would get cardiac MRI to make sure that EF is not in ICD range.  Pt notified of echo results and Dr Alford Highland recommendations. He agreed to cardiac MRI.

## 2011-05-09 ENCOUNTER — Encounter (HOSPITAL_COMMUNITY): Payer: Medicare Other

## 2011-05-12 ENCOUNTER — Encounter (HOSPITAL_COMMUNITY): Payer: Medicare Other

## 2011-05-14 ENCOUNTER — Encounter (HOSPITAL_COMMUNITY): Payer: Medicare Other

## 2011-05-14 ENCOUNTER — Encounter: Payer: Self-pay | Admitting: Cardiology

## 2011-05-16 ENCOUNTER — Encounter (HOSPITAL_COMMUNITY): Payer: Medicare Other | Attending: Cardiology

## 2011-05-16 DIAGNOSIS — Z5189 Encounter for other specified aftercare: Secondary | ICD-10-CM | POA: Insufficient documentation

## 2011-05-16 DIAGNOSIS — I10 Essential (primary) hypertension: Secondary | ICD-10-CM | POA: Insufficient documentation

## 2011-05-16 DIAGNOSIS — E119 Type 2 diabetes mellitus without complications: Secondary | ICD-10-CM | POA: Insufficient documentation

## 2011-05-16 DIAGNOSIS — E785 Hyperlipidemia, unspecified: Secondary | ICD-10-CM | POA: Insufficient documentation

## 2011-05-16 DIAGNOSIS — R079 Chest pain, unspecified: Secondary | ICD-10-CM | POA: Insufficient documentation

## 2011-05-16 DIAGNOSIS — I2510 Atherosclerotic heart disease of native coronary artery without angina pectoris: Secondary | ICD-10-CM | POA: Insufficient documentation

## 2011-05-16 DIAGNOSIS — I428 Other cardiomyopathies: Secondary | ICD-10-CM | POA: Insufficient documentation

## 2011-05-16 DIAGNOSIS — C61 Malignant neoplasm of prostate: Secondary | ICD-10-CM | POA: Insufficient documentation

## 2011-05-19 ENCOUNTER — Encounter (HOSPITAL_COMMUNITY): Payer: Medicare Other

## 2011-05-21 ENCOUNTER — Encounter (HOSPITAL_COMMUNITY): Payer: Medicare Other

## 2011-05-23 ENCOUNTER — Encounter (HOSPITAL_COMMUNITY): Payer: Medicare Other

## 2011-05-26 ENCOUNTER — Ambulatory Visit: Payer: Medicare Other | Admitting: Family Medicine

## 2011-05-26 ENCOUNTER — Telehealth: Payer: Self-pay | Admitting: *Deleted

## 2011-05-26 ENCOUNTER — Telehealth: Payer: Self-pay | Admitting: Cardiology

## 2011-05-26 NOTE — Telephone Encounter (Signed)
Called wife, multiple complaints and concerns for "failing husband".  Recommended she call for appt tomorrow.

## 2011-05-26 NOTE — Telephone Encounter (Signed)
Pt's wife would only like to speak to [place] about pt's severe body aches.

## 2011-05-26 NOTE — Telephone Encounter (Signed)
Pt wife calling re mri pt to have, pt having trouble moving around and wife wants to know if the mri can be a full body mri

## 2011-05-26 NOTE — Telephone Encounter (Signed)
Spoke with wife and let her know we can ni=ot do whole body scan  They will need to contact his PCP and get an appointment to see that MD for his complaints

## 2011-05-27 ENCOUNTER — Telehealth: Payer: Self-pay | Admitting: Cardiology

## 2011-05-27 NOTE — Telephone Encounter (Signed)
Faxed Labs to [place] at [facility] ([phone]).

## 2011-05-28 ENCOUNTER — Ambulatory Visit (INDEPENDENT_AMBULATORY_CARE_PROVIDER_SITE_OTHER): Payer: Medicare Other | Admitting: Family Medicine

## 2011-05-28 ENCOUNTER — Ambulatory Visit (HOSPITAL_COMMUNITY)
Admission: RE | Admit: 2011-05-28 | Discharge: 2011-05-28 | Disposition: A | Discharge status: Home / Self Care | Payer: Medicare Other | Source: Ambulatory Visit | Attending: Cardiology | Admitting: Cardiology

## 2011-05-28 ENCOUNTER — Other Ambulatory Visit: Payer: Self-pay | Admitting: Family Medicine

## 2011-05-28 ENCOUNTER — Encounter: Payer: Self-pay | Admitting: Family Medicine

## 2011-05-28 DIAGNOSIS — M791 Myalgia, unspecified site: Secondary | ICD-10-CM

## 2011-05-28 DIAGNOSIS — I428 Other cardiomyopathies: Secondary | ICD-10-CM | POA: Insufficient documentation

## 2011-05-28 DIAGNOSIS — I2510 Atherosclerotic heart disease of native coronary artery without angina pectoris: Secondary | ICD-10-CM

## 2011-05-28 DIAGNOSIS — R21 Rash and other nonspecific skin eruption: Secondary | ICD-10-CM

## 2011-05-28 DIAGNOSIS — R5381 Other malaise: Secondary | ICD-10-CM

## 2011-05-28 DIAGNOSIS — R5383 Other fatigue: Secondary | ICD-10-CM

## 2011-05-28 DIAGNOSIS — I2589 Other forms of chronic ischemic heart disease: Secondary | ICD-10-CM

## 2011-05-28 DIAGNOSIS — IMO0001 Reserved for inherently not codable concepts without codable children: Secondary | ICD-10-CM

## 2011-05-28 DIAGNOSIS — I5022 Chronic systolic (congestive) heart failure: Secondary | ICD-10-CM

## 2011-05-28 DIAGNOSIS — M2550 Pain in unspecified joint: Secondary | ICD-10-CM

## 2011-05-28 LAB — BASIC METABOLIC PANEL
Calcium: 9.8 mg/dL (ref 8.4–10.5)
GFR calc non Af Amer: >60 mL/min (ref >60)
Sodium: 139 mEq/L (ref 135–145)

## 2011-05-28 MED ORDER — GADOBENATE DIMEGLUMINE 529 MG/ML IV SOLN
30.0000 mL | Freq: Once | INTRAVENOUS | Status: AC
  Administered 2011-05-28: 30 mL via INTRAVENOUS

## 2011-05-28 MED ORDER — TRIAMCINOLONE ACETONIDE 0.5 % EX CREA: TOPICAL_CREAM | Freq: Two times a day (BID) | CUTANEOUS | Status: DC

## 2011-05-28 NOTE — Progress Notes (Signed)
  Subjective:    Patient ID: Wayne [name], male    DOB: 1941-07-10, 70 y.o.   MRN: [id number]  HPI Patient has history of CAD and congestive heart failure. He has other medical problems including remote history of prostate cancer, type 2 diabetes, hyperlipidemia, hypertension.  Seen for the following issues  Pain diffuse bilateral hips, trunk, and should. Progressive over one month. Also associated fatigue. Recent TSH normal. Electroytes normal with exception of elevated glucose which was nonfasting.  No erythema or warmth.  Pain worse with movement.  Medications reviewed. He is currently on simvastatin for lipids. Previous intolerance to Lipitor and Crestor with possible statin related myalgias. No objective evidence for joint inflammation such as heat or redness.  Bilateral thumb rash. Minimally pruritic. Also has associated rash bilateral armpit area which is erythematous non-scaling. Was using some triamcinolone with some relief but ran out. No change of deodorant   Review of Systems  Constitutional: Positive for fatigue. Negative for fever, chills, appetite change and unexpected weight change.  Eyes: Negative for visual disturbance.  Respiratory: Negative for shortness of breath and wheezing.   Cardiovascular: Negative for chest pain, palpitations and leg swelling.  Gastrointestinal: Negative for abdominal pain.  Genitourinary: Negative for dysuria.  Musculoskeletal: Positive for myalgias, back pain and arthralgias.  Skin: Positive for rash.  Neurological: Positive for weakness. Negative for syncope and headaches.  Hematological: Negative for adenopathy. Does not bruise/bleed easily.  Psychiatric/Behavioral: Negative for dysphoric mood.       Objective:   Physical Exam  Constitutional: He appears well-developed and well-nourished.  HENT:  Right Ear: External ear normal.  Left Ear: External ear normal.  Neck: Neck supple.  Cardiovascular: Normal rate, regular rhythm and normal  heart sounds.   No murmur heard. Pulmonary/Chest: Effort normal and breath sounds normal. No respiratory distress. He has no wheezes. He has no rales.  Lymphadenopathy:    He has no cervical adenopathy.  Skin:       Patient has rash axillary region bilaterally which is minimally raised erythematous and nontender. Nontender and non-pustular. He also has some rash on both palms which is slightly scaly and non-pustular          Assessment & Plan:  #1 diffuse myalgias and arthralgias. Question statin related. Rule out polymyalgia rheumatica. Check sedimentation rate.  If high consider treatment with low-dose prednisone #2 CAD history #3 hyperlipidemia #4 nonspecific rash axillary region. Triamcinolone cream

## 2011-05-30 ENCOUNTER — Telehealth: Payer: Self-pay | Admitting: *Deleted

## 2011-05-30 ENCOUNTER — Other Ambulatory Visit: Payer: Self-pay | Admitting: *Deleted

## 2011-05-30 DIAGNOSIS — I428 Other cardiomyopathies: Secondary | ICD-10-CM

## 2011-05-30 DIAGNOSIS — I5022 Chronic systolic (congestive) heart failure: Secondary | ICD-10-CM

## 2011-05-30 MED ORDER — PREDNISONE 10 MG PO TABS: ORAL_TABLET | ORAL | Status: DC

## 2011-05-30 NOTE — Telephone Encounter (Signed)
LV function is improved but still low. He will need an ICD. Need to arrange for EP evaluation. I would be glad to talk to him about this.  Dr Shirlee Latch talked with pt 05/30/11. Ordered referral to Dr [name] for ICD [employer] with optivol.  IMPRESSION: 1. Mildly dilated LV with mild LV hypertrophy. EF calculated to be 32% with global hypokinesis. Compared to the prior study, the LV cavity size is smaller and EF is somewhat improved.  2. Normal RV size and systolic function.  3. Mild subendocardial scar in the basal inferior, posterior, and anterolateral segments.

## 2011-05-30 NOTE — Progress Notes (Signed)
Quick Note:  Pt informed and he voiced his understanding, appt scheduled for Monday ______

## 2011-05-30 NOTE — Progress Notes (Signed)
Quick Note:  Pt informed ______ 

## 2011-06-02 ENCOUNTER — Encounter: Payer: Self-pay | Admitting: Family Medicine

## 2011-06-02 ENCOUNTER — Ambulatory Visit (INDEPENDENT_AMBULATORY_CARE_PROVIDER_SITE_OTHER): Payer: Medicare Other | Admitting: Family Medicine

## 2011-06-02 VITALS — BP 110/60 | Temp 98.4°F | Wt 226.0 lb

## 2011-06-02 DIAGNOSIS — M353 Polymyalgia rheumatica: Secondary | ICD-10-CM | POA: Insufficient documentation

## 2011-06-02 MED ORDER — PREDNISONE 10 MG PO TABS: ORAL_TABLET | ORAL | Status: DC

## 2011-06-02 NOTE — Patient Instructions (Signed)
Polymyalgia Rheumatica Polymyalgia rheumatica (also called PMR or polymyalgia) is a rheumatologic (arthritic) condition that causes pain and morning stiffness in your neck, shoulders, and hips. It is an inflammatory condition. In some people, inflammation of certain structures in the shoulder, hips, or other joints can be seen on special testing. It does not cause joint destruction, as occurs in other arthritic conditions. It usually occurs after 68 years of age, and is more common as you age. It can be confused with several other diseases, but it is usually easily treated. People with PMR often have, or can develop, a more severe rheumatologic condition called giant cell arteritis (also called CGA or temporal arteritis).  CAUSES The exact cause of PMR is not known.   There are genetic factors involved.   Viruses have been suspected in the cause of PMR. This has not been proven.  SYMPTOMS  Aching, pain, and morning stiffness your neck, both shoulders, or both hips.   Symptoms usually start slowly and build gradually.   Morning stiffness usually lasts at least 30 minutes.   Swelling and tenderness in other joints of the arms, hands, legs, and feet may occur.   Swelling and inflammation in the wrists can cause nerve inflammation at the wrist (carpal tunnel syndrome).   You may also have low grade fever, fatigue, weakness, decreased appetite and weight loss.  DIAGNOSIS  Your caregiver may suspect that you have PMR based on your description of your symptoms and on your exam.   Your caregiver will examine you to be sure you do not have diseases that can be confused with PMR. These diseases include rheumatoid arthritis, fibromyalgia, or thyroid disease.   Your caregiver should check for signs of giant cell arteritis. This can cause serious complications such as blindness.   Lab tests can help confirm that you have PMR and not other diseases, but are sometimes inconclusive.   X-rays cannot  show PMR. However, it can identify other diseases like rheumatoid arthritis. Your caregiver may have you see a specialist in arthritis and inflammatory diseases (rheumatologist).  TREATMENT The goal of treatment is relief of symptoms. Treatment does not shorten the course of the illness or prevent complications. With proper treatment, you usually feel better almost right away.   The initial treatment of PMR is usually cortisone (steroid) medication, such as prednisone or prednisolone. Your caregiver will help determine a starting dose, which is usually a low to moderate dose. The dose is gradually reduced every few weeks to months. Treatment usually lasts one to three years.   Other stronger medications are rarely needed. They will only be prescribed if your symptoms do not get better on cortisone medication alone, or if they recur as the dose is reduced.   Cortisone medication can have different side effects. With the doses of cortisone needed for PMR, the side effects are usually mild. Discuss this with your caregiver.   Your caregiver will evaluate you regularly during your treatment. They will do this in order to assess progress and to check for complications of the illness or treatment.   Physical therapy is sometimes useful. This is especially true if your joints are still stiff after other symptoms have improved.  HOME CARE INSTRUCTIONS  Follow your caregiver's instructions. Do not change your dose of cortisone medication on your own.   Keep your appointments for follow-up lab tests and caregiver visits. Your lab tests need to be monitored. You must get checked periodically for giant cell arteritis.   Follow   your caregiver's guidance regarding physical activity (usually no restrictions are needed) or physical therapy.   Your caregiver may have instructions to prevent or check for side effects from cortisone medication (including bone density testing or treatment). Follow their  instructions carefully.  SEEK MEDICAL CARE IF:  You develop any side effects from treatment. Side effects can include:   Elevated blood pressure.   High blood sugar (or worsening of diabetes, if you are diabetic).   Difficulty fighting off infections.   Weight gain.   Weakness of the bones (osteoporosis).   Your aches, pains, morning stiffness, or other symptoms get worse with time. This is especially true after your dose of cortisone is reduced.   You develop new joint symptoms (pain, swelling, etc.)  SEEK IMMEDIATE MEDICAL CARE IF:  You develop a severe headache.   You start vomiting.   You have problems with your vision.   You develop an unexplained oral temperature above 100.5 F (38.1 C).  Document Released: 01/08/2005 Document Re-Released: 09/27/2009 Exit[employer] Patient Information 2011 [employer]. 

## 2011-06-02 NOTE — Progress Notes (Signed)
  Subjective:    Patient ID: Wayne [name], male    DOB: [birth year], 70 y.o.   MRN: [id number]  HPI Patient seen for followup. Refer to prior note. Suspected possible polymyalgia rheumatica by symptoms. Diffuse achy pain mostly in shoulders and hips. Sedimentation rate elevated at 44. Rheumatoid factor and ANA negative. Patient started on prednisone 20 mg daily for 3 days then 10 mg daily and has had dramatic response with almost total resolution of symptoms. Also has more energy.   Review of Systems  Constitutional: Negative for fever, chills and appetite change.  Respiratory: Negative for cough and shortness of breath.   Cardiovascular: Negative for chest pain.  Hematological: Negative for adenopathy.       Objective:   Physical Exam  Constitutional: He is oriented to person, place, and time. He appears well-developed and well-nourished.  Cardiovascular: Normal rate and regular rhythm.   Pulmonary/Chest: Effort normal and breath sounds normal.  Musculoskeletal: He exhibits no edema.  Neurological: He is alert and oriented to person, place, and time. No cranial nerve deficit.  Psychiatric: He has a normal mood and affect. His behavior is normal.          Assessment & Plan:  Recent fatigue and myalgias. Patient's history and dramatic response to prednisone strongly suggest polymyalgia rheumatica. Continue prednisone 10 mg daily. We explained that we'll eventually taper off but not before several months to one year. Reviewed proper calcium and vitamin D intake. Reassess one month repeat sedimentation rate then.

## 2011-06-03 ENCOUNTER — Telehealth: Payer: Self-pay | Admitting: Cardiology

## 2011-06-03 NOTE — Telephone Encounter (Signed)
Pt to see Dr [name] 06/30/11. Pt's wife is aware of this appt.

## 2011-06-03 NOTE — Telephone Encounter (Signed)
Per pt wife wants to know who's the MD will be putting in the defib.

## 2011-06-09 ENCOUNTER — Encounter: Payer: Self-pay | Admitting: Cardiology

## 2011-06-09 ENCOUNTER — Ambulatory Visit (INDEPENDENT_AMBULATORY_CARE_PROVIDER_SITE_OTHER): Payer: Medicare Other | Admitting: Cardiology

## 2011-06-09 DIAGNOSIS — I5022 Chronic systolic (congestive) heart failure: Secondary | ICD-10-CM

## 2011-06-09 DIAGNOSIS — I2510 Atherosclerotic heart disease of native coronary artery without angina pectoris: Secondary | ICD-10-CM

## 2011-06-09 DIAGNOSIS — E785 Hyperlipidemia, unspecified: Secondary | ICD-10-CM

## 2011-06-09 DIAGNOSIS — Z79899 Other long term (current) drug therapy: Secondary | ICD-10-CM

## 2011-06-09 DIAGNOSIS — I428 Other cardiomyopathies: Secondary | ICD-10-CM

## 2011-06-09 DIAGNOSIS — I10 Essential (primary) hypertension: Secondary | ICD-10-CM

## 2011-06-09 MED ORDER — LISINOPRIL 5 MG PO TABS: ORAL_TABLET | ORAL | Status: DC

## 2011-06-09 NOTE — Patient Instructions (Signed)
Your physician recommends that you schedule a follow-up appointment in: 3 MONTHS WITH DR [facility]  Your physician has recommended you make the following change in your medication: DECREASE FUROSEMIDE  40 MG TO 1 TAB DAILY AND INCREASE LISINOPRIL TO 7.5 MG TWICE DAILY   Your physician recommends that you return for lab work in: 2 WEEKS BMET AND DIG LEVEL 428.22 V58.69   3 MONTHS LIPID LIVER 272.4 V58.69

## 2011-06-09 NOTE — Progress Notes (Signed)
PCP: Dr. Caryl Never  70 yo with history of DM, HTN, CAD, and ischemic cardiomyopathy presents for cardiology followup. Patient had a CHF exacerbation in 2/12 and was found to have LV systolic dysfunction with EF around 20%. LHC showed RCA, ramus, and CFX disease. RCA was subtotally occluded. Cardiac MRI showed that all walls, including the inferior wall, should be viable. Patient therefore underwent opening of his chronic totally occluded RCA as well as PCI to the ramus. He received drug eluting stents and is now on Effient. Patient's exercise tolerance has been much improved with PCI and medical treatment. He does not have dyspnea when walking on flat ground. He is slightly short of breath when walking up a hill.  No chest pain. Weight is down 1 lb since last appointment.  He was recently diagnosed with polymyalgia rheumatica and is much better with prednisone treatment.    Echo was done in 5/12 read as showing EF 40%.  Visually this looked worse than 40%.  I had him get a repeat cardiac MRI.  This showed EF 32% with mild LV dilation.    ECG: NSR, LVH  Labs (3/12): TSH normal, ESR normal, HDL 34, LDL 77, LFTs normal, K 4.3, creatinine 0.9  Labs (4/12): K 4, creatinine 0.9, BNP 234 Labs (6/12): K 3.8, creatinine 0.72  Past Medical History:  1. HYPERTENSION  2. HYPERLIPIDEMIA  3. ECZEMA  4. RHINITIS  5. HERPES ZOSTER OPHTHALMICUS  6. ADENOCARCINOMA, PROSTATE: Status post prostatectomy in 2009. Has had some incontinence since then.  7. Diabetes mellitus type II  8. Arthritis  9. Obesity  10. GERD: rare  11. CAD: Presented with exertional dyspnea, never had chest pain. LHC (2/12) with subtotalled proximal RCA and left to right collaterals, 90% proximal moderate-sized ramus, 95% proximal relatively small CFX, 40-50% proximal LAD. Cardiac MRI (2/12) showed EF 21%, some mild scar in basal segments but all wall segments would be expected to be viable.        a. DES x 5 (overlapping) to RCA 01/30/11        b. DES x 1 to RI 01/30/11  12. Ischemic CMP: Echo (2/12) with moderately dilated LV, EF about 20% with diffuse hypokinesis and inferior akinesis, pseudonormal diastolic function, mild MR, severe LAE, mildly decreased RV systolic function. RHC (2/12) with mean RA 12, PA 40/25, mean PCWP 26, CI 2.1.  Echo (5/12) with EF 40% (appeared worse to my eye) with posterior HK, basal inferior AK, inferoseptal AK, basal anteroseptal AK, mild MR.  Cardiac MRI was repeated and showed EF 32% (improved from 21%) and mild LV dilation (was severely dilated before) with diffuse hypokinesis and subendocardial scar in the basal inferior, basal posterior, and basal anterolateral segments.   13. Cervical OA.  14. Polymyalgia rheumatica  Family History:  Father died with MI at age 4. He was an alcoholic. Mother died with cancer at around 84.   Social History:  Occupation: retired Naval architect  Married, lives in [place]  Past smoker, quit around 1990   Review of Systems  All systems reviewed and negative except as per HPI.   Current Outpatient Prescriptions  Medication Sig Dispense Refill  . aspirin 81 MG tablet Take 81 mg by mouth daily.        . carvedilol (COREG) 12.5 MG tablet Take 1 tablet (12.5 mg total) by mouth 2 (two) times daily.  60 tablet  6  . Coenzyme Q10 200 MG TABS 2 (two) times daily.     0  .  digoxin (LANOXIN) 0.125 MG tablet Take 125 mcg by mouth daily.        . furosemide (LASIX) 40 MG tablet Take 40 mg by mouth 2 (two) times daily.       Marland Kitchen glucose blood test strip 1 each by Other route daily. Use as instructed       . lisinopril (PRINIVIL,ZESTRIL) 5 MG tablet 1 1/2 TABS TWICE DAILY  90 tablet  11  . metFORMIN (GLUCOPHAGE) 1000 MG tablet Take 1,000 mg by mouth 2 (two) times daily with a meal.        . Multiple Vitamins-Minerals (CENTRUM SILVER ULTRA MENS PO) Take 1 tablet by mouth.        . nitroGLYCERIN (NITROSTAT) 0.4 MG SL tablet Place 0.4 mg under the tongue every 5 (five) minutes  as needed.        . prasugrel (EFFIENT) 10 MG TABS Take 1 tablet (10 mg total) by mouth daily.  30 tablet  6  . predniSONE (DELTASONE) 10 MG tablet take 1 tab daily  30 tablet  5  . simvastatin (ZOCOR) 20 MG tablet TAKE ONE TABLET BY MOUTH EVERY DAY  30 tablet  11  . spironolactone (ALDACTONE) 25 MG tablet Take 25 mg by mouth daily. 1/2 (HALF) TAB QD      . triamcinolone (KENALOG) 0.5 % cream Apply topically 2 (two) times daily.  45 g  2  . DISCONTD: lisinopril (PRINIVIL,ZESTRIL) 5 MG tablet One tablet two times daily  60 tablet  6    BP 107/68  Pulse 78  Resp 16  Ht 5\' 10"  (1.778 m)  Wt 226 lb (102.513 kg)  BMI 32.43 kg/m2 General: NAD, obese.  Neck: No JVD, no thyromegaly or thyroid nodule.  Lungs: Clear to auscultation bilaterally with normal respiratory effort.  CV: Nondisplaced PMI. Heart regular S1/S2, no S3/S4, no murmur. No edema. No carotid bruit. Normal pedal pulses.  Abdomen: Soft, nontender, no hepatosplenomegaly, no distention.  Neurologic: Alert and oriented x 3.  Psych: Normal affect.  Extremities: No clubbing or cyanosis.

## 2011-06-10 DIAGNOSIS — I5022 Chronic systolic (congestive) heart failure: Secondary | ICD-10-CM | POA: Insufficient documentation

## 2011-06-10 NOTE — Assessment & Plan Note (Signed)
NYHA class II, appears euvolemic on exam.  Cardiac MRI with EF 32% and mild LV dilation (improved from prior cardiac MRI but still low function).   - Increase lisinopril to 7.5 mg bid with BMET in 2 wks.   - Decrease Lasix to 40 mg daily.  - Continue Coreg, digoxin, spironolactone.   - I am going to refer patient for ICD placement.  Given body habitus, can be difficult to assess volume.  Would suggest [employer] with optivol.  QRS is narrow so not CRT candidate.  - Needs to increase activity: suggest daily walks.

## 2011-06-10 NOTE — Assessment & Plan Note (Signed)
Check lipids in 3 months.  Goal LDL < 70.

## 2011-06-10 NOTE — Assessment & Plan Note (Signed)
Patient was found on cardiac MRI to have viability in his inferior wall, so we elected to open his chronically totally occluded RCA.  He now has DES in the RCA and ramus.  He denies ischemic symptoms.  He will need to continue ASA and Effient.  Continue statin, beta blocker, ACEI.  

## 2011-06-23 ENCOUNTER — Other Ambulatory Visit: Payer: Self-pay | Admitting: Family Medicine

## 2011-06-26 ENCOUNTER — Encounter: Payer: Self-pay | Admitting: Internal Medicine

## 2011-06-30 ENCOUNTER — Telehealth: Payer: Self-pay | Admitting: Internal Medicine

## 2011-06-30 ENCOUNTER — Ambulatory Visit (INDEPENDENT_AMBULATORY_CARE_PROVIDER_SITE_OTHER): Payer: Medicare Other | Admitting: Internal Medicine

## 2011-06-30 ENCOUNTER — Other Ambulatory Visit (INDEPENDENT_AMBULATORY_CARE_PROVIDER_SITE_OTHER): Payer: Medicare Other | Admitting: *Deleted

## 2011-06-30 ENCOUNTER — Encounter: Payer: Self-pay | Admitting: Internal Medicine

## 2011-06-30 DIAGNOSIS — I5022 Chronic systolic (congestive) heart failure: Secondary | ICD-10-CM

## 2011-06-30 DIAGNOSIS — Z79899 Other long term (current) drug therapy: Secondary | ICD-10-CM

## 2011-06-30 DIAGNOSIS — I10 Essential (primary) hypertension: Secondary | ICD-10-CM

## 2011-06-30 DIAGNOSIS — I509 Heart failure, unspecified: Secondary | ICD-10-CM

## 2011-06-30 DIAGNOSIS — I2510 Atherosclerotic heart disease of native coronary artery without angina pectoris: Secondary | ICD-10-CM

## 2011-06-30 NOTE — Telephone Encounter (Signed)
Maureen Ralphs from [employer] wants to know if the patient is able to be in a study. Maureen Ralphs would like to speak with a kelly.

## 2011-06-30 NOTE — Progress Notes (Signed)
Wayne [name] is a pleasant 70 y.o. WM patient with a h/o CAD, s/p PCI 2/12, ischemic CM (EF 32%), and NYHA Class II/III CHF who presents today for EP consultation regarding further risk stratification of sudden death.  He reports being in good health until 2/12 when he presented to [facility] and was found to have LV systolic dysfunction with EF of 20%. LHC at that time showed RCA, ramus, and CFX disease. RCA was subtotally occluded. Cardiac MRI showed that all walls, including the inferior wall, were viable. He therefore underwent opening of his chronic totally occluded RCA as well as PCI to the ramus. He received drug eluting stents and is now on Effient.  He reports that his exercise tolerance improved with PCI and medical treatment.  He was recently diagnosed with polymyalgia rheumatica and is much better with prednisone treatment.   Despite optimal medical therapy, his EF by echo remains 32%.  He continues to have SOB with moderate activity.  Today, he denies symptoms of palpitations, chest pain, orthopnea, PND, lower extremity edema, dizziness, presyncope, syncope, or neurologic sequela. The patient is tolerating medications without difficulties and is otherwise without complaint today.   Past Medical History  Diagnosis Date  . DM 06/20/2009  . HYPERLIPIDEMIA 06/20/2009  . HYPERTENSION 06/20/2009  . CORONARY ATHEROSCLEROSIS NATIVE CORONARY ARTERY 01/27/2011    s/p PCI 2/12  . Herpes zoster ophthalmicus   . Arthritis   . Obesity   . GERD (gastroesophageal reflux disease)   . Ischemic cardiomyopathy 2/12    EF 31%  . Adenocarcinoma of prostate     s/p seed implants  . Polymyalgia rheumatica    Past Surgical History  Procedure Date  . Prostate surgery     cancer, seed implant    Current Outpatient Prescriptions  Medication Sig Dispense Refill  . aspirin 81 MG tablet Take 81 mg by mouth daily.        . carvedilol (COREG) 12.5 MG tablet Take 1 tablet (12.5 mg total) by mouth 2 (two) times  daily.  60 tablet  6  . digoxin (LANOXIN) 0.125 MG tablet Take 125 mcg by mouth daily.        . furosemide (LASIX) 40 MG tablet Take 40 mg by mouth 2 (two) times daily.       Marland Kitchen glucose blood test strip 1 each by Other route daily. Use as instructed       . lisinopril (PRINIVIL,ZESTRIL) 5 MG tablet 5 mg 2 (two) times daily.        . metFORMIN (GLUCOPHAGE) 1000 MG tablet Take 1,000 mg by mouth 2 (two) times daily with a meal.        . Multiple Vitamins-Minerals (CENTRUM SILVER ULTRA MENS PO) Take 1 tablet by mouth.        . nitroGLYCERIN (NITROSTAT) 0.4 MG SL tablet Place 0.4 mg under the tongue every 5 (five) minutes as needed.        . prasugrel (EFFIENT) 10 MG TABS Take 1 tablet (10 mg total) by mouth daily.  30 tablet  6  . predniSONE (DELTASONE) 10 MG tablet take 1 tab daily  30 tablet  5  . simvastatin (ZOCOR) 20 MG tablet TAKE ONE TABLET BY MOUTH EVERY DAY  30 tablet  11  . spironolactone (ALDACTONE) 25 MG tablet Take 25 mg by mouth daily. 1/2 (HALF) TAB QD      . triamcinolone (KENALOG) 0.5 % cream Apply topically 2 (two) times daily.  45 g  2  . DISCONTD: lisinopril (PRINIVIL,ZESTRIL) 5 MG tablet 1 1/2 TABS TWICE DAILY  90 tablet  11    Allergies  Allergen Reactions  . Atorvastatin     REACTION: sore legs    History   Social History  . Marital Status: Married    Spouse Name: N/A    Number of Children: N/A  . Years of Education: N/A   Occupational History  . RETIRED     TRUCK DRIVER   Social History Main Topics  . Smoking status: Former Smoker -- 0.3 packs/day for 20 years    Types: Cigarettes    Quit date: 02/23/1989  . Smokeless tobacco: Not on file  . Alcohol Use: No  . Drug Use: No  . Sexually Active: Not on file   Other Topics Concern  . Not on file   Social History Narrative   Retired Naval architect    Family History  Problem Relation Age of Onset  . Cancer Mother 38    unknown CA  . Heart disease Father 23    MI, died  . Alcohol abuse Father      ROS- All systems are reviewed and negative except as per the HPI above  Physical Exam: Filed Vitals:   06/30/11 1534  BP: 132/68  Pulse: 86  Resp: 16  Height: 5\' 10"  (1.778 m)  Weight: 225 lb (102.059 kg)    GEN- The patient is well appearing, alert and oriented x 3 today.   Head- normocephalic, atraumatic Eyes-  Sclera clear, conjunctiva pink Ears- hearing intact Oropharynx- clear Neck- supple, no JVP Lymph- no cervical lymphadenopathy Lungs- Clear to ausculation bilaterally, normal work of breathing Heart- Regular rate and rhythm, no murmurs, rubs or gallops, PMI not laterally displaced GI- soft, NT, ND, + BS Extremities- no clubbing, cyanosis, or edema MS- no significant deformity or atrophy Skin- no rash or lesion Psych- euthymic mood, full affect Neuro- strength and sensation are intact  EKG 06/09/11 revealed NSR 78 bpm, LVH, QRS 110 msec, QTc 417 msec  Assessment and Plan:

## 2011-06-30 NOTE — Assessment & Plan Note (Signed)
The patient has an ischemic CM (EF 32% by recent MRI despite optimal medical therapy), NYHA Class II/III CHF, and CAD. At this time, he meets MADIT II/ SCD-HeFT criteria for ICD implantation for primary prevention of sudden death.  He does not have QRS widening and therefore would not likely benefit from resynchronization.  Risks, benefits, alternatives to ICD implantation were discussed in detail with the patient today. The patient  understands that the risks include but are not limited to bleeding, infection, pneumothorax, perforation, tamponade, vascular damage, renal failure, MI, stroke, death, inappropriate shocks, and lead dislodgement.  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 At this time, he is not ready to commit to ICD implantation.  He wishes to further contemplate this option and will contact my office when he is ready to proceed.  He will continue to follow-up with Dr Shirlee Latch as scheduled.

## 2011-06-30 NOTE — Assessment & Plan Note (Signed)
Stable No change required today  

## 2011-06-30 NOTE — Assessment & Plan Note (Signed)
No symptoms of ischemia No changes today

## 2011-07-01 LAB — BASIC METABOLIC PANEL WITH GFR
CO2: 23 mEq/L (ref 19–32)
Chloride: 104 mEq/L (ref 96–112)
Creat: 0.78 mg/dL (ref 0.50–1.35)

## 2011-07-01 LAB — DIGOXIN LEVEL: Digoxin Level: 0.5 ng/mL — ABNORMAL LOW (ref 0.8–2.0)

## 2011-07-04 ENCOUNTER — Other Ambulatory Visit (INDEPENDENT_AMBULATORY_CARE_PROVIDER_SITE_OTHER): Payer: Medicare Other

## 2011-07-04 ENCOUNTER — Telehealth: Payer: Self-pay | Admitting: *Deleted

## 2011-07-04 DIAGNOSIS — E785 Hyperlipidemia, unspecified: Secondary | ICD-10-CM

## 2011-07-04 DIAGNOSIS — I5022 Chronic systolic (congestive) heart failure: Secondary | ICD-10-CM

## 2011-07-04 DIAGNOSIS — E119 Type 2 diabetes mellitus without complications: Secondary | ICD-10-CM

## 2011-07-04 DIAGNOSIS — E875 Hyperkalemia: Secondary | ICD-10-CM

## 2011-07-04 DIAGNOSIS — Z79899 Other long term (current) drug therapy: Secondary | ICD-10-CM

## 2011-07-04 LAB — LIPID PANEL
HDL: 47.1 mg/dL (ref >39.00)
LDL Cholesterol: 79 mg/dL (ref 0–99)
Total CHOL/HDL Ratio: 3
Triglycerides: 126 mg/dL (ref 0.0–149.0)
VLDL: 25.2 mg/dL (ref 0.0–40.0)

## 2011-07-04 LAB — BASIC METABOLIC PANEL
CO2: 31 mEq/L (ref 19–32)
Chloride: 108 mEq/L (ref 96–112)
Creatinine, Ser: 0.8 mg/dL (ref 0.4–1.5)
Potassium: 5.4 mEq/L — ABNORMAL HIGH (ref 3.5–5.1)

## 2011-07-04 LAB — HEPATIC FUNCTION PANEL
Albumin: 3.9 g/dL (ref 3.5–5.2)
Alkaline Phosphatase: 59 U/L (ref 39–117)

## 2011-07-04 LAB — HEMOGLOBIN A1C: Hgb A1c MFr Bld: 8.2 % — ABNORMAL HIGH (ref 4.6–6.5)

## 2011-07-04 NOTE — Telephone Encounter (Signed)
Lab order put into computer

## 2011-07-08 ENCOUNTER — Telehealth: Payer: Self-pay

## 2011-07-08 ENCOUNTER — Telehealth: Payer: Self-pay | Admitting: *Deleted

## 2011-07-08 MED ORDER — GLIMEPIRIDE 2 MG PO TABS: 2.0000 mg | ORAL_TABLET | ORAL | Status: DC

## 2011-07-08 NOTE — Telephone Encounter (Signed)
Notes Recorded by Jacqlyn Krauss, RN on 07/08/2011 at 9:28 AM I talked with wife. Wife states pt aware of Dr Alford Highland recommendations. He is scheduled for repeat BMP 07/14/11. Notes Recorded by Marca Ancona, MD on 07/04/2011 at 6:15 PM Lipids good, cut back spironolactone to 12.5 mg daily and repeat BMET in 10 days.

## 2011-07-08 NOTE — Telephone Encounter (Signed)
Message copied by Beverely Low on Tue Jul 08, 2011  3:57 PM ------      Message from: Kristian Covey      Created: Sat Jul 05, 2011 12:41 PM       A1C has increased some to 8.2%.  Goal < 7.0.  Partly increased from prednisone.  I would add low dose Amaryl 2 mg po daily and plan to repeat A1C in 3 months.

## 2011-07-08 NOTE — Telephone Encounter (Signed)
Pt's wife notified.

## 2011-07-08 NOTE — Telephone Encounter (Signed)
Message copied by Jacqlyn Krauss on Tue Jul 08, 2011  9:29 AM ------      Message from: Laurey Morale      Created: Fri Jul 04, 2011  6:15 PM       Lipids good, cut back spironolactone to 12.5 mg daily and repeat BMET in 10 days.

## 2011-07-14 ENCOUNTER — Other Ambulatory Visit: Payer: Medicare Other | Admitting: *Deleted

## 2011-07-16 ENCOUNTER — Other Ambulatory Visit (INDEPENDENT_AMBULATORY_CARE_PROVIDER_SITE_OTHER): Payer: Medicare Other | Admitting: *Deleted

## 2011-07-16 DIAGNOSIS — E875 Hyperkalemia: Secondary | ICD-10-CM

## 2011-07-16 LAB — BASIC METABOLIC PANEL
CO2: 26 mEq/L (ref 19–32)
Chloride: 102 mEq/L (ref 96–112)
Potassium: 4 mEq/L (ref 3.5–5.1)
Sodium: 138 mEq/L (ref 135–145)

## 2011-07-17 ENCOUNTER — Other Ambulatory Visit: Payer: Self-pay | Admitting: Cardiology

## 2011-07-30 ENCOUNTER — Telehealth: Payer: Self-pay | Admitting: Cardiology

## 2011-07-30 NOTE — Telephone Encounter (Signed)
Patient states he has been taken Lasix 40 mg once a day, yesterday he peak up a new medication refill and the directions  said that he is to take Lasix 40 mg twice  A day, instead of once a day. Patient states he can't handle this medication twice a day. He would like to know the reason why medication was increased. According to  Office visita records on 03/12/11 pt was taken medication once a day, MD recommends to continue current Lasix dose.  MD visit note on 04/30 /12 medication list is listed as taken medication twice a day. I was not able to find between these two dates when the frequency of medication was increased. Patient will continue taken Lasix 40 mg once a day until he rears from [place].

## 2011-07-30 NOTE — Telephone Encounter (Signed)
Furosemide was taking 1 a day new rx dosage is two a day, wants to know why increase?

## 2011-08-15 ENCOUNTER — Telehealth: Payer: Self-pay | Admitting: Cardiology

## 2011-08-15 NOTE — Telephone Encounter (Signed)
I talked with pt's wife. I will leave samples of Effient 10mg  at front desk.

## 2011-08-15 NOTE — Telephone Encounter (Signed)
Pt needs some samples of effient 10mg  because pt gets it from [place] because it is cheaper but he is out and needs some to last until he can get his order

## 2011-08-15 NOTE — Telephone Encounter (Signed)
Wife states pt also needs a 90 day prescription for Effeint. I will leave this at the front desk for pt to pick-up on Tuesday 08/19/11.

## 2011-08-30 ENCOUNTER — Other Ambulatory Visit: Payer: Self-pay | Admitting: Cardiology

## 2011-09-02 ENCOUNTER — Ambulatory Visit (INDEPENDENT_AMBULATORY_CARE_PROVIDER_SITE_OTHER): Payer: Medicare Other | Admitting: Cardiology

## 2011-09-02 ENCOUNTER — Encounter: Payer: Self-pay | Admitting: Cardiology

## 2011-09-02 DIAGNOSIS — R0602 Shortness of breath: Secondary | ICD-10-CM

## 2011-09-02 DIAGNOSIS — E119 Type 2 diabetes mellitus without complications: Secondary | ICD-10-CM

## 2011-09-02 DIAGNOSIS — G4733 Obstructive sleep apnea (adult) (pediatric): Secondary | ICD-10-CM | POA: Insufficient documentation

## 2011-09-02 DIAGNOSIS — I5022 Chronic systolic (congestive) heart failure: Secondary | ICD-10-CM

## 2011-09-02 DIAGNOSIS — R0609 Other forms of dyspnea: Secondary | ICD-10-CM

## 2011-09-02 DIAGNOSIS — E785 Hyperlipidemia, unspecified: Secondary | ICD-10-CM

## 2011-09-02 DIAGNOSIS — I2510 Atherosclerotic heart disease of native coronary artery without angina pectoris: Secondary | ICD-10-CM

## 2011-09-02 LAB — BRAIN NATRIURETIC PEPTIDE: Pro B Natriuretic peptide (BNP): 145 pg/mL — ABNORMAL HIGH (ref 0.0–100.0)

## 2011-09-02 LAB — BASIC METABOLIC PANEL
BUN: 16 mg/dL (ref 6–23)
CO2: 27 mEq/L (ref 19–32)
Chloride: 107 mEq/L (ref 96–112)
Glucose, Bld: 154 mg/dL — ABNORMAL HIGH (ref 70–99)
Potassium: 4.9 mEq/L (ref 3.5–5.1)
Sodium: 142 mEq/L (ref 135–145)

## 2011-09-02 LAB — HEMOGLOBIN A1C: Hgb A1c MFr Bld: 7.8 % — ABNORMAL HIGH (ref 4.6–6.5)

## 2011-09-02 NOTE — Assessment & Plan Note (Signed)
Patient was found on cardiac MRI to have viability in his inferior wall, so we elected to open his chronically totally occluded RCA.  He now has DES in the RCA and ramus.  He denies ischemic symptoms.  He will need to continue ASA and Effient.  Continue statin, beta blocker, ACEI (restart ACEI and statin).

## 2011-09-02 NOTE — Assessment & Plan Note (Signed)
I strongly suspect significant OSA.  I think his daytime sleepiness has gotten worse recently with weight gain associated with prednisone use for a couple of months.  I do not think his sleepiness is related to any of his medications.  As above, he is going to restart the cardiac meds he stopped.  I am going to set him up for a sleep study.

## 2011-09-02 NOTE — Assessment & Plan Note (Signed)
NYHA class II, mildly volume overloaded on exam after stopping Lasix.  Cardiac MRI with EF 32% and mild LV dilation (improved from prior cardiac MRI but still low function).   - Restart lisinopril 5 mg bid and spironolactone 12.5 mg daily.  - Restart Lasix at lower dose: 20 mg daily (has trouble getting to the bathroom in time).  Will need to do a better job of watching his sodium intake.   - Continue Coreg and digoxin at current doses.   - BMET/BNP/digoxin level today, will also check BMET when follows up in 1 month.   - He is still thinking about ICD placement.  I encouraged him to have it done.  I am going to see him back in a month, he will think about it until then. - Needs to increase activity: suggest daily walks.

## 2011-09-02 NOTE — Patient Instructions (Addendum)
Restart your medications.  Lab today--BMP/BNP/HGB A1c / Digoxin level   428.22  414.01  Your physician has recommended that you have a sleep study. This test records several body functions during sleep, including: brain activity, eye movement, oxygen and carbon dioxide blood levels, heart rate and rhythm, breathing rate and rhythm, the flow of air through your mouth and nose, snoring, body muscle movements, and chest and belly movement.  Dr Shirlee Latch will refer you to pulmonary for evaluation and management of sleep apnea.    Your physician recommends that you schedule a follow-up appointment in: 1 month with Dr Shirlee Latch.

## 2011-09-02 NOTE — Assessment & Plan Note (Signed)
Patient is no longer taking metformin.  He says Dr. Caryl Never told him to stop it.  He has had recent weight gain with steroids, I am concerned that he may need more BP control.

## 2011-09-02 NOTE — Progress Notes (Signed)
PCP: Dr. Caryl Never  70 yo with history of DM, HTN, CAD, and ischemic cardiomyopathy presents for cardiology followup. Patient had a CHF exacerbation in 2/12 and was found to have LV systolic dysfunction with EF around 20%. LHC showed RCA, ramus, and CFX disease. RCA was subtotally occluded. Cardiac MRI showed that all walls, including the inferior wall, should be viable. Patient therefore underwent opening of his chronic totally occluded RCA as well as PCI to the ramus. He received drug eluting stents and is now on Effient.   Patient's exercise tolerance has been much improved with PCI and medical treatment. He does not have dyspnea when walking on flat ground. He is slightly short of breath when walking up a hill.  No chest pain.  Since last appointment, he has gained 6 lbs.  He was diagnosed with polymyalgia rheumatica and started on prednisone, likely helping to lead to the weight gain.  He subsequently saw an orthopedist who told him he had significant c-spine osteoarthritis and had him stop the prednisone.   He also has been having problems with falling asleep very easily during the day.  He fell asleep once at a stoplight.  His wife no longer sleeps in the bed with him because of his loud snoring.  He and his wife were concerned that the sleepiness was a medication side effect.  He stopped taking simvastatin, spironolactone, metformin, and lisinopril.  He stopped Lasix because he was having severe urinary urgency and sometimes could not make it to the bathroom.   ECG: NSR, old inferior MI  Labs (3/12): TSH normal, ESR normal, HDL 34, LDL 77, LFTs normal, K 4.3, creatinine 0.9  Labs (4/12): K 4, creatinine 0.9, BNP 234 Labs (6/12): K 3.8, creatinine 0.72  Past Medical History:  1. HYPERTENSION  2. HYPERLIPIDEMIA  3. ECZEMA  4. RHINITIS  5. HERPES ZOSTER OPHTHALMICUS  6. ADENOCARCINOMA, PROSTATE: Status post prostatectomy in 2009. Has had some incontinence since then.  7. Diabetes mellitus  type II  8. Arthritis  9. Obesity  10. GERD: rare  11. CAD: Presented with exertional dyspnea, never had chest pain. LHC (2/12) with subtotalled proximal RCA and left to right collaterals, 90% proximal moderate-sized ramus, 95% proximal relatively small CFX, 40-50% proximal LAD. Cardiac MRI (2/12) showed EF 21%, some mild scar in basal segments but all wall segments would be expected to be viable.        a. DES x 5 (overlapping) to RCA 01/30/11        b. DES x 1 to RI 01/30/11  12. Ischemic CMP: Echo (2/12) with moderately dilated LV, EF about 20% with diffuse hypokinesis and inferior akinesis, pseudonormal diastolic function, mild MR, severe LAE, mildly decreased RV systolic function. RHC (2/12) with mean RA 12, PA 40/25, mean PCWP 26, CI 2.1.  Echo (5/12) with EF 40% (appeared worse to my eye) with posterior HK, basal inferior AK, inferoseptal AK, basal anteroseptal AK, mild MR.  Cardiac MRI was repeated and showed EF 32% (improved from 21%) and mild LV dilation (was severely dilated before) with diffuse hypokinesis and subendocardial scar in the basal inferior, basal posterior, and basal anterolateral segments.   13. Cervical OA.  14. Polymyalgia rheumatica  Family History:  Father died with MI at age 87. He was an alcoholic. Mother died with cancer at around 60.   Social History:  Occupation: retired [employer] architect  Married, lives in [place]  Past smoker, quit around 1990   Review of Systems  All systems  reviewed and negative except as per HPI.   Current Outpatient Prescriptions  Medication Sig Dispense Refill  . aspirin 81 MG tablet Take 81 mg by mouth daily.        . carvedilol (COREG) 12.5 MG tablet Take 1 tablet (12.5 mg total) by mouth 2 (two) times daily.  60 tablet  6  . digoxin (LANOXIN) 0.125 MG tablet TAKE ONE TABLET BY MOUTH EVERY DAY  30 tablet  6  . furosemide (LASIX) 20 MG tablet Take 20 mg by mouth daily.        Marland Kitchen glucose blood test strip 1 each by Other route daily. Use  as instructed       . lisinopril (PRINIVIL,ZESTRIL) 5 MG tablet 5 mg 2 (two) times daily.        . Multiple Vitamins-Minerals (CENTRUM SILVER ULTRA MENS PO) Take 1 tablet by mouth.        . nitroGLYCERIN (NITROSTAT) 0.4 MG SL tablet Place 0.4 mg under the tongue every 5 (five) minutes as needed.        . prasugrel (EFFIENT) 10 MG TABS Take 1 tablet (10 mg total) by mouth daily.  30 tablet  6  . simvastatin (ZOCOR) 20 MG tablet TAKE ONE TABLET BY MOUTH EVERY DAY  30 tablet  11  . spironolactone (ALDACTONE) 25 MG tablet Take 25 mg by mouth daily. 1/2 (HALF) TAB QD      . triamcinolone (KENALOG) 0.5 % cream Apply topically 2 (two) times daily.  45 g  2    BP 132/80  Pulse 73  Ht 5\' 10"  (1.778 m)  Wt 231 lb 12.8 oz (105.144 kg)  BMI 33.26 kg/m2 General: NAD, obese.  Neck: JVP 7-8 cm, no thyromegaly or thyroid nodule.  Lungs: Clear to auscultation bilaterally with normal respiratory effort.  CV: Nondisplaced PMI. Heart regular S1/S2, no S3/S4, no murmur. 1+ edema 1/2 up lower legs bilaterally. No carotid bruit. Normal pedal pulses.  Abdomen: Soft, nontender, no hepatosplenomegaly, no distention.  Neurologic: Alert and oriented x 3.  Psych: Normal affect.  Extremities: No clubbing or cyanosis.

## 2011-09-02 NOTE — Assessment & Plan Note (Signed)
Check lipids in 2 months.  He is going to restart taking simvastatin.

## 2011-09-03 ENCOUNTER — Telehealth: Payer: Self-pay | Admitting: *Deleted

## 2011-09-03 ENCOUNTER — Telehealth: Payer: Self-pay | Admitting: Cardiology

## 2011-09-03 MED ORDER — GLIMEPIRIDE 2 MG PO TABS: 2.0000 mg | ORAL_TABLET | Freq: Every day | ORAL | Status: DC

## 2011-09-03 NOTE — Telephone Encounter (Signed)
Message copied by Jacqlyn Krauss on Wed Sep 03, 2011  3:40 PM ------      Message from: Laurey Morale      Created: Wed Sep 03, 2011 10:45 AM       Needs something for diabetes.  Would take glimepiride that he has at home.

## 2011-09-03 NOTE — Telephone Encounter (Signed)
Pt would like to pick up a copy of his meds.  Made copy and ready to be picked up.

## 2011-09-03 NOTE — Telephone Encounter (Signed)
Patient wants to discuss medication.

## 2011-09-03 NOTE — Telephone Encounter (Signed)
I talked with pt. 

## 2011-09-03 NOTE — Telephone Encounter (Signed)
Notes Recorded by Jacqlyn Krauss, RN on 09/03/2011 at 3:40 PM Pt notified of results. He will restart glimepiride 5mg  daily. He will follow-up with Dr Caryl Never about his diabetes. Notes Recorded by Jacqlyn Krauss, RN on 09/03/2011 at 10:51 AM LMTCB Notes Recorded by Marca Ancona, MD on 09/03/2011 at 10:45 AM Needs something for diabetes. Would take glimepiride that he has at home.

## 2011-09-03 NOTE — Telephone Encounter (Signed)
Pt had been taking glimepiride 2mg  daily not 5mg  .

## 2011-09-10 ENCOUNTER — Ambulatory Visit: Payer: Medicare Other | Admitting: Cardiology

## 2011-09-10 ENCOUNTER — Other Ambulatory Visit: Payer: Medicare Other | Admitting: *Deleted

## 2011-09-11 DIAGNOSIS — N529 Male erectile dysfunction, unspecified: Secondary | ICD-10-CM | POA: Insufficient documentation

## 2011-09-15 LAB — BASIC METABOLIC PANEL
CO2: 29
Chloride: 104
GFR calc Af Amer: >60
Sodium: 140

## 2011-09-15 LAB — DIFFERENTIAL
Basophils Relative: 1
Eosinophils Absolute: 0.6
Lymphs Abs: 1.3
Monocytes Absolute: 0.4
Monocytes Relative: 6
Neutro Abs: 5.1
Neutrophils Relative %: 68

## 2011-09-15 LAB — CBC
Hemoglobin: 14.6
MCHC: 32.9
MCV: 85.8
RBC: 5.18
WBC: 7.5

## 2011-09-15 LAB — GLUCOSE, CAPILLARY
Glucose-Capillary: 169 — ABNORMAL HIGH
Glucose-Capillary: 82

## 2011-09-19 LAB — PROTIME-INR
INR: 0.9 (ref 0.00–1.49)
Prothrombin Time: 12.4 seconds (ref 11.6–15.2)

## 2011-09-19 LAB — COMPREHENSIVE METABOLIC PANEL
Albumin: 3.8 g/dL (ref 3.5–5.2)
Alkaline Phosphatase: 75 U/L (ref 39–117)
BUN: 18 mg/dL (ref 6–23)
Chloride: 104 mEq/L (ref 96–112)
Creatinine, Ser: 0.87 mg/dL (ref 0.4–1.5)
Glucose, Bld: 159 mg/dL — ABNORMAL HIGH (ref 70–99)
Potassium: 4.3 mEq/L (ref 3.5–5.1)
Total Bilirubin: 0.9 mg/dL (ref 0.3–1.2)

## 2011-09-19 LAB — CBC
HCT: 46.2 % (ref 39.0–52.0)
Hemoglobin: 15 g/dL (ref 13.0–17.0)
MCV: 87 fL (ref 78.0–100.0)
Platelets: 243 10*3/uL (ref 150–400)
RDW: 14.7 % (ref 11.5–15.5)
WBC: 7.1 10*3/uL (ref 4.0–10.5)

## 2011-09-19 LAB — APTT: aPTT: 31 seconds (ref 24–37)

## 2011-09-21 ENCOUNTER — Ambulatory Visit (HOSPITAL_BASED_OUTPATIENT_CLINIC_OR_DEPARTMENT_OTHER): Payer: Medicare Other | Attending: Cardiology

## 2011-09-21 DIAGNOSIS — R0683 Snoring: Secondary | ICD-10-CM

## 2011-09-21 DIAGNOSIS — G4733 Obstructive sleep apnea (adult) (pediatric): Secondary | ICD-10-CM | POA: Insufficient documentation

## 2011-10-03 DIAGNOSIS — G4733 Obstructive sleep apnea (adult) (pediatric): Secondary | ICD-10-CM

## 2011-10-03 NOTE — Procedures (Signed)
[name], [name]:  [name]  MEDICAL RECORD NO.:  [id number]          PATIENT TYPE:  OUT  LOCATION:  SLEEP CENTER                 FACILITY:  [facility]  PHYSICIAN:  Barbaraann Share, MD,FCCPDATE OF BIRTH:  [birth year] 20[birth year] 1943  DATE OF STUDY:  09/21/2011                           NOCTURNAL POLYSOMNOGRAM  REFERRING PHYSICIAN:  Marca Ancona, MD  INDICATION FOR STUDY:  Hypersomnia with sleep apnea.  EPWORTH SLEEPINESS SCORE:  13.  MEDICATIONS:  SLEEP ARCHITECTURE:  The patient had a total sleep time of 324 minutes with very little slow-wave sleep and decreased quantity of REM.  Sleep onset latency was normal at 4 minutes, and REM onset was normal at 81 minutes.  Sleep efficiency was 88% during the diagnostic portion of the study, and 69% during the titration portion.  RESPIRATORY DATA:  The patient underwent a split night protocol, where he was found to have 142 primarily obstructive events in the first 123 minutes of sleep.  This gave him an apnea-hypopnea index of 70 events per hour during the diagnostic portion of the study.  The events occurred in all body positions, and there was very loud snoring noted throughout.  By protocol, the patient was then fitted with a small Respironics comfort gel nasal mask, although the comment was made by the sleep technician that he may need a full-face mask due to oral leaks. The pressure was increased over time in order to control both his obstructive events and snoring.  His pressure was increased as high as 16 cm of water, however, he continued to have both obstructive and central events.  Further titration was not possible due to the late nature of the time.  OXYGEN DATA:  There was O2 desaturation as low as 62% with the patient's more severe obstructive events.  CARDIAC DATA:  Rare PAC and PVC noted, but no clinically significant arrhythmias were seen.  MOVEMENT-PARASOMNIA:  The patient had no significant leg jerks  or other abnormal behavior seen.  IMPRESSIONS-RECOMMENDATIONS: 1. Split night study shows severe obstructive sleep apnea/hypopnea     syndrome with an AHI of 70 events per hour and O2 desaturation as     low as 62% during the diagnostic portion of the study.  The patient     was then fitted with a small Respironics comfort gel nasal mask,     and titrated as high as 16 cm of water without adequate control of     his events.  There was not enough time available for further     titration.  The patient will either need to return to the sleep     center for a formal CPAP titration, or possibly have an     autotitration at home.  Consideration should also be given to using     a full-face mask rather than a nasal mask due to oral leaks.  The     patient should also be     encouraged to work aggressively on weight loss. 2. Rare PAC and PVC, but no clinically significant arrhythmias were     seen.     Barbaraann Share, MD,FCCP Diplomate, American Board of  Sleep Medicine Electronically Signed    [facility]/MEDQ  D:  10/03/2011 09:24:25  T:  10/03/2011 14:23:31  Job:  [id number]

## 2011-10-09 ENCOUNTER — Telehealth: Payer: Self-pay | Admitting: *Deleted

## 2011-10-09 DIAGNOSIS — G4730 Sleep apnea, unspecified: Secondary | ICD-10-CM

## 2011-10-09 NOTE — Telephone Encounter (Signed)
Dr Shirlee Latch reviewed sleep study done 09/21/11--severe OSA. Refer to pulmonary.

## 2011-10-09 NOTE — Telephone Encounter (Signed)
I talked with pt. He  agreed to a referral to pulmonary for evaluation  of OSA.

## 2011-10-14 ENCOUNTER — Ambulatory Visit (INDEPENDENT_AMBULATORY_CARE_PROVIDER_SITE_OTHER): Payer: Medicare Other | Admitting: Pulmonary Disease

## 2011-10-14 ENCOUNTER — Encounter: Payer: Self-pay | Admitting: Pulmonary Disease

## 2011-10-14 ENCOUNTER — Encounter: Payer: Self-pay | Admitting: Cardiology

## 2011-10-14 ENCOUNTER — Ambulatory Visit (INDEPENDENT_AMBULATORY_CARE_PROVIDER_SITE_OTHER): Payer: Medicare Other | Admitting: Cardiology

## 2011-10-14 VITALS — BP 130/78 | HR 92 | Temp 98.0°F | Ht 70.0 in | Wt 238.2 lb

## 2011-10-14 DIAGNOSIS — E7800 Pure hypercholesterolemia, unspecified: Secondary | ICD-10-CM

## 2011-10-14 DIAGNOSIS — I2510 Atherosclerotic heart disease of native coronary artery without angina pectoris: Secondary | ICD-10-CM

## 2011-10-14 DIAGNOSIS — I5022 Chronic systolic (congestive) heart failure: Secondary | ICD-10-CM

## 2011-10-14 DIAGNOSIS — E119 Type 2 diabetes mellitus without complications: Secondary | ICD-10-CM

## 2011-10-14 DIAGNOSIS — R0602 Shortness of breath: Secondary | ICD-10-CM

## 2011-10-14 DIAGNOSIS — E785 Hyperlipidemia, unspecified: Secondary | ICD-10-CM

## 2011-10-14 DIAGNOSIS — G4733 Obstructive sleep apnea (adult) (pediatric): Secondary | ICD-10-CM

## 2011-10-14 LAB — BASIC METABOLIC PANEL
BUN: 17 mg/dL (ref 6–23)
CO2: 25 mEq/L (ref 19–32)
Calcium: 8.7 mg/dL (ref 8.4–10.5)
Creatinine, Ser: 0.7 mg/dL (ref 0.4–1.5)
GFR: 119.23 mL/min (ref >60.00)
Glucose, Bld: 277 mg/dL — ABNORMAL HIGH (ref 70–99)

## 2011-10-14 MED ORDER — CO-ENZYME Q-10 100 MG PO CAPS: ORAL_CAPSULE | ORAL | Status: DC

## 2011-10-14 MED ORDER — CARVEDILOL 12.5 MG PO TABS: ORAL_TABLET | ORAL | Status: DC

## 2011-10-14 MED ORDER — PRAVASTATIN SODIUM 40 MG PO TABS: 40.0000 mg | ORAL_TABLET | Freq: Every evening | ORAL | Status: DC

## 2011-10-14 MED ORDER — FUROSEMIDE 20 MG PO TABS: ORAL_TABLET | ORAL | Status: DC

## 2011-10-14 MED ORDER — GLIMEPIRIDE 4 MG PO TABS: 4.0000 mg | ORAL_TABLET | Freq: Every day | ORAL | Status: DC

## 2011-10-14 NOTE — Assessment & Plan Note (Signed)
The patient has been found to have severe obstructive sleep apnea, and will need aggressive treatment with CPAP while working on weight loss.  I have had a long discussion with the pt about sleep apnea, including its impact on QOL and CV health.  He is agreeable to trying CPAP, and we'll start him on a moderate pressure level to allow for desensitization.  He also had issues with mask fitting at the sleep Center, and we'll therefore refer him for a mask fit during the day as well.  I have encouraged him to work aggressively on weight loss.

## 2011-10-14 NOTE — Patient Instructions (Signed)
Your physician recommends that you schedule a follow-up appointment in: 2 months.   Your physician has recommended you make the following change in your medication:   Increase carvedilol to 18. 75 mg by mouth twice daily.  Decrease furosemide to 20 mg by mouth every other day. Stop glipizide. Increase glimepiride to 4 mg by mouth daily. Stop Simvastatin.   In 2 weeks start Pravachol 40 mg by mouth daily. Start Coenzyme Q10--200 mg by mouth daily.

## 2011-10-14 NOTE — Assessment & Plan Note (Signed)
Taking both glipizide and glimepiride.  Will stop glipizide and have him increase glimepiride to 4 mg daily as this seems to be safer with cardiac disease.

## 2011-10-14 NOTE — Progress Notes (Signed)
PCP: Dr. Caryl Never  70 yo with history of DM, HTN, CAD, and ischemic cardiomyopathy presents for cardiology followup. Patient had a CHF exacerbation in 2/12 and was found to have LV systolic dysfunction with EF around 20%. LHC showed RCA, ramus, and CFX disease. RCA was subtotally occluded. Cardiac MRI showed that all walls, including the inferior wall, should be viable. Patient therefore underwent opening of his chronic totally occluded RCA as well as PCI to the ramus. He received drug eluting stents and is now on Effient.  Recent repeat cardiac MRI with medical treatment of systolic CHF showed EF 32%.   Patient stopped most of his medications this summer.  He is back on all his meds again.  Main complaint is myalgias in the shoulders and legs.  This actually mostly resolved when he was off simvastatin.  He is fairly miserable now that he is taking it again.  He is not very active because of the pain.  He does, however, deny significant exertional dyspnea.  No chest pain.  Recent sleep study showed severe OSA.  He falls asleep easily during the day and snores loudly at night.   Labs (3/12): TSH normal, ESR normal, HDL 34, LDL 77, LFTs normal, K 4.3, creatinine 0.9  Labs (4/12): K 4, creatinine 0.9, BNP 234 Labs (6/12): K 3.8, creatinine 0.72 Labs (9/12): K 4.4, creatinine 0.9, BNP 145, digoxin 0.8, hgbA1c 7.8  Past Medical History:  1. HYPERTENSION  2. HYPERLIPIDEMIA  3. ECZEMA  4. RHINITIS  5. HERPES ZOSTER OPHTHALMICUS  6. ADENOCARCINOMA, PROSTATE: Status post prostatectomy in 2009. Has had some incontinence since then.  7. Diabetes mellitus type II  8. Arthritis  9. Obesity  10. GERD: rare  11. CAD: Presented with exertional dyspnea, never had chest pain. LHC (2/12) with subtotalled proximal RCA and left to right collaterals, 90% proximal moderate-sized ramus, 95% proximal relatively small CFX, 40-50% proximal LAD. Cardiac MRI (2/12) showed EF 21%, some mild scar in basal segments but all  wall segments would be expected to be viable.        a. DES x 5 (overlapping) to RCA 01/30/11        b. DES x 1 to RI 01/30/11  12. Ischemic CMP: Echo (2/12) with moderately dilated LV, EF about 20% with diffuse hypokinesis and inferior akinesis, pseudonormal diastolic function, mild MR, severe LAE, mildly decreased RV systolic function. RHC (2/12) with mean RA 12, PA 40/25, mean PCWP 26, CI 2.1.  Echo (5/12) with EF 40% (appeared worse to my eye) with posterior HK, basal inferior AK, inferoseptal AK, basal anteroseptal AK, mild MR.  Cardiac MRI was repeated and showed EF 32% (improved from 21%) and mild LV dilation (was severely dilated before) with diffuse hypokinesis and subendocardial scar in the basal inferior, basal posterior, and basal anterolateral segments.   13. Cervical OA.  14. Polymyalgia rheumatica 15. OSA: Severe on sleep study 10/12.   Family History:  Father died with MI at age 57. He was an alcoholic. Mother died with cancer at around 30.   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   Current Outpatient Prescriptions  Medication Sig Dispense Refill  . aspirin 81 MG tablet Take 81 mg by mouth daily.        . digoxin (LANOXIN) 0.125 MG tablet TAKE ONE TABLET BY MOUTH EVERY DAY  30 tablet  6  . glimepiride (AMARYL) 4 MG tablet Take 1 tablet (4 mg total) by mouth daily before breakfast.  30 tablet  6  . glucose blood test strip 1 each by Other route daily. Use as instructed       . lisinopril (PRINIVIL,ZESTRIL) 5 MG tablet 5 mg 2 (two) times daily.        . Multiple Vitamins-Minerals (CENTRUM SILVER ULTRA MENS PO) Take 1 tablet by mouth.        . nitroGLYCERIN (NITROSTAT) 0.4 MG SL tablet Place 0.4 mg under the tongue every 5 (five) minutes as needed.        . prasugrel (EFFIENT) 10 MG TABS Take 1 tablet (10 mg total) by mouth daily.  30  tablet  6  . spironolactone (ALDACTONE) 25 MG tablet Take 12.5 mg by mouth daily.       Marland Kitchen triamcinolone (KENALOG) 0.5 % cream Apply topically 2 (two) times daily.  45 g  2  . DISCONTD: glimepiride (AMARYL) 2 MG tablet Take 1 tablet (2 mg total) by mouth daily before breakfast.  30 tablet  1  . carvedilol (COREG) 12.5 MG tablet Take one and one half tablets by mouth twice daily  90 tablet  6  . Co-Enzyme Q-10 100 MG CAPS Take 2 tablets by mouth daily  60 capsule  0  . furosemide (LASIX) 20 MG tablet Take one tablet by mouth every other day  15 tablet  6  . pravastatin (PRAVACHOL) 40 MG tablet Take 1 tablet (40 mg total) by mouth every evening.  30 tablet  6    BP 147/81  Pulse 92  Ht 5\' 10"  (1.778 m)  Wt 235 lb (106.595 kg)  BMI 33.72 kg/m2 General: NAD, obese.  Neck: Thick, JVP 7 cm, no thyromegaly or thyroid nodule.  Lungs: Clear to auscultation bilaterally with normal respiratory effort.  CV: Nondisplaced PMI. Heart regular S1/S2, no S3/S4, no murmur. 1+ bilateral ankle edema. No carotid bruit. Normal pedal pulses.  Abdomen: Soft, nontender, no hepatosplenomegaly, no distention.  Neurologic: Alert and oriented x 3.  Psych: Normal affect.  Extremities: No clubbing or cyanosis.

## 2011-10-14 NOTE — Assessment & Plan Note (Signed)
Patient was found on cardiac MRI to have viability in his inferior wall, so we elected to open his chronically totally occluded RCA.  He now has DES in the RCA and ramus.  He denies ischemic symptoms.  He will need to continue ASA and Effient.  Continue statin, beta blocker, ACEI.

## 2011-10-14 NOTE — Progress Notes (Signed)
  Subjective:    Patient ID: Wayne [name], male    DOB: 1941-02-20, 70 y.o.   MRN: [id number]  HPI The patient is a 70 year old male who I've been asked to see for management of obstructive sleep apnea.  He is recently undergone nocturnal polysomnography, which showed an AHI of 70 events per hour.  He was unable to be titrated to an optimal pressure because of time constraints associated with a split night study.  The patient has been noted to have very loud snoring, as well as an abnormal breathing pattern during sleep.  He has frequent awakenings at night, and describes nonrestorative sleep.  The patient states that he becomes very sleepy anytime he sits down, and will fall sleep easily while watching television or reading.  He recently found himself dozing at a stoplight.  The patient states that his weight is down about 20 pounds over the last 2 years.  Sleep Questionnaire: What time do you typically go to bed?( Between what hours) 10:30 pm How long does it take you to fall asleep? 5 min How many times during the night do you wake up? What time do you get out of bed to start your day? 0800 Do you drive or operate heavy machinery in your occupation? No How much has your weight changed (up or down) over the past two years? (In pounds) 20 lb (9.072 kg) Have you ever had a sleep study before? Yes If yes, location of study? [facility] If yes, date of study? 3 weeks ago Do you currently use CPAP? No Do you wear oxygen at any time? No    Review of Systems  Constitutional: Positive for unexpected weight change. Negative for fever.  HENT: Negative for ear pain, nosebleeds, congestion, sore throat, rhinorrhea, sneezing, trouble swallowing, dental problem, postnasal drip and sinus pressure.   Eyes: Negative for redness and itching.  Respiratory: Negative for cough, chest tightness, shortness of breath and wheezing.   Cardiovascular: Negative for palpitations and leg swelling.  Gastrointestinal: Negative for nausea  and vomiting.  Genitourinary: Negative for dysuria.  Musculoskeletal: Positive for joint swelling.  Skin: Negative for rash.  Neurological: Negative for headaches.  Hematological: Does not bruise/bleed easily.  Psychiatric/Behavioral: Negative for dysphoric mood. The patient is not nervous/anxious.        Objective:   Physical Exam Constitutional:  Obese male, no acute distress  HENT:  Nares patent, but crusted blood and discharge on right.  Deviation to left  Oropharynx without exudate, palate and uvula are thick and elongated.   Eyes:  Perrla, eomi, no scleral icterus  Neck:  No JVD, no TMG  Cardiovascular:  Normal rate, regular rhythm, no rubs or gallops.  No murmurs        Intact distal pulses  Pulmonary :  Normal breath sounds, no stridor or respiratory distress   No rales, rhonchi, or wheezing  Abdominal:  Soft, nondistended, bowel sounds present.  No tenderness noted.   Musculoskeletal:  mild lower extremity edema noted.  Lymph Nodes:  No cervical lymphadenopathy noted  Skin:  No cyanosis noted  Neurologic:  Alert, appropriate, moves all 4 extremities without obvious deficit.         Assessment & Plan:

## 2011-10-14 NOTE — Patient Instructions (Signed)
Will get you set up for a mask fitting at the sleep center. Will start cpap at a moderate pressure level.  Please call me if having tolerance issues.  Will get your pressure optimized down the road. Work on weight loss followup with me in 6 weeks.

## 2011-10-14 NOTE — Assessment & Plan Note (Signed)
Seems to have rather severe simvastatin-related myalgias.  I am going to stop simvastatin.  He will start coenzyme Q10 200 mg daily.  2 weeks after stopping simvastatin, he will start on pravastatin 40 mg daily as this tends to be better-tolerated.  Will then get lipids/LFTs in 2 months.  If he has myalgias with pravastatin, would next try Crestor 5 mg every other day.

## 2011-10-15 ENCOUNTER — Telehealth: Payer: Self-pay | Admitting: Cardiology

## 2011-10-15 NOTE — Telephone Encounter (Signed)
Spoke with patient and he is aware that appointment with Marita Kansas at the sleep lab has been scheduled for Monday 11/03/11 at 11:00. Pt has been provided with the phone number to the sleep lab. Patient has been advised that Victorino Dike with [facility] will be contacting him today or tomorrow for cpap set up. Advised patient that Victorino Dike has been doing this for about 10 yrs and is very good working with cpap masks. I have asked Jasmine December with [facility] to contact patient to set up ASAP. Advised patient that if he is satisfied with the mask that [facility] provides, then we can cancel the appointment at the sleep lab. Patient has been given both the sleep lab phone number and my phone number [phone] to call us if he is not satisfied with mask or has any problem at all with his cpap.

## 2011-10-15 NOTE — Telephone Encounter (Signed)
New message  Please call about about appointment for sleep apnea  Please call him back

## 2011-10-15 NOTE — Assessment & Plan Note (Signed)
Euvolemic on exam, NYHA class II symptoms.   - Continue current digoxin, lisinopril, and spironolactone.   - Increase Coreg to 18.75 mg bid.  - OK to decrease Lasix to every other day dosing.  - BMET/BNP today.  - Followup in 2 months.  - He saw [name] to discuss ICD and continues to be unsure about it.  I again recommended that he have one placed.  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

## 2011-10-15 NOTE — Progress Notes (Signed)
Addended by: Laurey Morale on: 10/15/2011 12:04 AM   Modules accepted: Level of Service

## 2011-10-15 NOTE — Telephone Encounter (Signed)
Attempted to call pt. Line busy. Will try back in a few minutes.  Appointment at the sleep lab for a mask fit has been scheduled for Monday 11/03/11 at 11:00. This is the first available b/c Marita Kansas is having to work 3rd shift due to hospital going on epic. CPAP order has been faxed to [facility] on 10/14/11 and Janina Mayo, RT with [facility] is very good at fitting cpap mask and will contact the patient today to arrange set up.

## 2011-10-15 NOTE — Telephone Encounter (Signed)
Pt calling about appt to be fitted for CPAP mask.

## 2011-10-21 ENCOUNTER — Ambulatory Visit (INDEPENDENT_AMBULATORY_CARE_PROVIDER_SITE_OTHER): Payer: Medicare Other | Admitting: Family Medicine

## 2011-10-21 DIAGNOSIS — Z23 Encounter for immunization: Secondary | ICD-10-CM

## 2011-10-24 ENCOUNTER — Telehealth: Payer: Self-pay | Admitting: Cardiology

## 2011-10-24 ENCOUNTER — Ambulatory Visit (INDEPENDENT_AMBULATORY_CARE_PROVIDER_SITE_OTHER): Payer: Medicare Other | Admitting: Family Medicine

## 2011-10-24 ENCOUNTER — Encounter: Payer: Self-pay | Admitting: Family Medicine

## 2011-10-24 VITALS — BP 110/70 | Temp 98.2°F | Wt 238.0 lb

## 2011-10-24 DIAGNOSIS — E119 Type 2 diabetes mellitus without complications: Secondary | ICD-10-CM

## 2011-10-24 DIAGNOSIS — M2550 Pain in unspecified joint: Secondary | ICD-10-CM

## 2011-10-24 DIAGNOSIS — M791 Myalgia, unspecified site: Secondary | ICD-10-CM

## 2011-10-24 DIAGNOSIS — M25552 Pain in left hip: Secondary | ICD-10-CM

## 2011-10-24 DIAGNOSIS — M25559 Pain in unspecified hip: Secondary | ICD-10-CM

## 2011-10-24 DIAGNOSIS — M25551 Pain in right hip: Secondary | ICD-10-CM

## 2011-10-24 DIAGNOSIS — IMO0001 Reserved for inherently not codable concepts without codable children: Secondary | ICD-10-CM

## 2011-10-24 MED ORDER — PREDNISONE 10 MG PO TABS: ORAL_TABLET | ORAL | Status: DC

## 2011-10-24 NOTE — Progress Notes (Signed)
Subjective:    Patient ID: Wayne [name], male    DOB: 1941-01-27, 70 y.o.   MRN: [id number]  HPI  Patient is seen for followup. He is seeing multiple specialists. We saw him back in June with clinical suspicion of polymyalgia rheumatica. Sed rate modestly elevated at 44 but patient had diffuse trunk pains mostly trunk pain and some arthralgias-shoulders and hips. Rheumatoid factor and ANA negative. We placed him on very low-dose prednisone and initially had almost total immediate resolution which we felt suggested likelihood of PMR of fairly high. Subsequently saw his orthopedist for neck pain and orthopedist took liberty of discontinuing his prednisone. He does have some cervical disc problem.  He has some chronic cervical disc problems but current pain is much more diffuse than neck and involves trunk and hips.  His other medical problems include type 2 diabetes, hyperlipidemia, hypertension, CAD, and cardiomyopathy.  Recent discontinuation of simvastatin per cardiologist 2 weeks ago but this has not helped. He now presents with extreme achiness including much of his trunk, shoulders, elbows, knees, and hips. No warmth or erythema. No obvious joint effusions. No rashes. He is having a extreme difficulty changing positions because of body aches. No fever. Has some associated fatigue. Thyroid function normal during the past year.  Patient recently started on CPAP for obstructive sleep apnea. This has not helped his fatigue levels thus far.  Past Medical History  Diagnosis Date  . DM 06/20/2009  . HYPERLIPIDEMIA 06/20/2009  . HYPERTENSION 06/20/2009  . CORONARY ATHEROSCLEROSIS NATIVE CORONARY ARTERY 01/27/2011    s/p PCI 2/12  . Herpes zoster ophthalmicus   . Arthritis   . Obesity   . GERD (gastroesophageal reflux disease)   . Ischemic cardiomyopathy 2/12    EF 31%  . Adenocarcinoma of prostate     s/p seed implants  . Polymyalgia rheumatica    Past Surgical History  Procedure Date  . Prostate  surgery     cancer, seed implant    reports that he quit smoking about 22 years ago. His smoking use included Cigarettes. He has a 6 pack-year smoking history. He does not have any smokeless tobacco history on file. He reports that he does not drink alcohol or use illicit drugs. family history includes Alcohol abuse in his father; Cancer (age of onset:80) in his mother; and Heart disease (age of onset:61) in his father. Allergies  Allergen Reactions  . Atorvastatin     REACTION: sore legs     Review of Systems  Constitutional: Positive for fatigue. Negative for fever, chills and appetite change.  Respiratory: Negative for cough.   Cardiovascular: Negative for chest pain and leg swelling.  Musculoskeletal: Positive for myalgias and arthralgias. Negative for joint swelling.  Skin: Negative for rash.  Neurological: Negative for dizziness and syncope.  Hematological: Negative for adenopathy. Does not bruise/bleed easily.       Objective:   Physical Exam  Constitutional: He is oriented to person, place, and time. He appears well-developed and well-nourished.  HENT:  Mouth/Throat: Oropharynx is clear and moist.  Neck: Neck supple. No thyromegaly present.  Cardiovascular: Normal rate and regular rhythm.   Pulmonary/Chest: Effort normal and breath sounds normal. No respiratory distress. He has no wheezes. He has no rales.  Musculoskeletal: He exhibits no edema.  Lymphadenopathy:    He has no cervical adenopathy.  Neurological: He is alert and oriented to person, place, and time.          Assessment & Plan:  Myalgias  and polyarthralgias. Doubt statin related although he's only been off simvastatin for 2 weeks. Still have clinical suspicion this may represent polymyalgia, especially given his dramatic response to prednisone previously. Check creatinine kinase and repeat sed rate and C-reactive protein. We are well aware of potential adverse effects of prednisone in diabetic.  Rheumatology referral. Over the weekend start back low-dose prednisone 20 mg daily for 2 days then 10 mg daily until followup with rheumatology.  Monitor blood sugars very closely.

## 2011-10-24 NOTE — Telephone Encounter (Signed)
Pt has been having pain all over his body (7/10).  The pain started getting much worse during the night and today he states he is unable to move around much and is weak because of the pain (10/10).  No sob.  He got a flu shot a couple days ago.  He has been using heat, ice and advil for the pain without relief.  He is on no current chol meds.  The pain is in his joints.

## 2011-10-24 NOTE — Telephone Encounter (Signed)
New message:  Dr. Shirlee Latch is aware of the all over body pain that patient has been having.  He is hurting worse today then ever.  Please call wife back [phone] and advise what he should do.  He is in a lot of pain but this is on going.  Wife states may need to go to ED.  Please call and advise.

## 2011-10-24 NOTE — Patient Instructions (Signed)
Take 2 prednisone today and tomorrow then one daily.

## 2011-10-24 NOTE — Telephone Encounter (Signed)
Pt to see pcp or urgent care per Dr Eden Emms.  Pt was notified.

## 2011-10-28 ENCOUNTER — Telehealth: Payer: Self-pay | Admitting: *Deleted

## 2011-10-28 NOTE — Telephone Encounter (Signed)
Wayne Ancona, Wayne [name] More Detail >>      Wayne Ancona, Wayne [name]        Sent: Sun October 26, 2011  9:01 AM    To: Jacqlyn Krauss, RN        Wayne [name]    MRN: [id number] DOB: [birth year]     Pt Home: [id number]               Message     Thurston Hole,  Could you please call Americo Vallery this week to see if his pains are better with prednisone? If so, he can restart his statin. [place]

## 2011-10-28 NOTE — Telephone Encounter (Signed)
I talked with pt. Pt states his pain really isn't any better since starting prednisone. He did restart pravastatin  yesterday. He will let me know if he is unable to tolerate pravastatin.

## 2011-11-03 ENCOUNTER — Other Ambulatory Visit (HOSPITAL_BASED_OUTPATIENT_CLINIC_OR_DEPARTMENT_OTHER): Payer: Medicare Other

## 2011-12-26 ENCOUNTER — Ambulatory Visit: Payer: Medicare Other | Admitting: Pulmonary Disease

## 2011-12-30 ENCOUNTER — Other Ambulatory Visit: Payer: Self-pay | Admitting: Family Medicine

## 2011-12-30 ENCOUNTER — Telehealth: Payer: Self-pay | Admitting: Cardiology

## 2011-12-30 MED ORDER — COENZYME Q10 200 MG PO TABS: ORAL_TABLET | ORAL | Status: DC

## 2011-12-30 MED ORDER — PRASUGREL HCL 10 MG PO TABS: 10.0000 mg | ORAL_TABLET | Freq: Every day | ORAL | Status: DC

## 2011-12-30 NOTE — Telephone Encounter (Signed)
Pt was put on prednisone and wants to know if he needs to continue the co-enzyme,also needs effient 10mg  90 days supply refill needed uses [facility] [address], pls call [phone]

## 2011-12-30 NOTE — Telephone Encounter (Signed)
Pt will continue coenzyme Q10 200mg  daily. He is requesting a prescription for 90 days for coenzyme Q 10 200mg  daily to see if he can get reimbursement for it. I had already sent in a prescription for Effient 10mg  90 days 3 reills.

## 2012-01-07 ENCOUNTER — Encounter: Payer: Self-pay | Admitting: Cardiology

## 2012-01-07 ENCOUNTER — Ambulatory Visit (INDEPENDENT_AMBULATORY_CARE_PROVIDER_SITE_OTHER): Payer: Medicare Other | Admitting: Cardiology

## 2012-01-07 DIAGNOSIS — I2510 Atherosclerotic heart disease of native coronary artery without angina pectoris: Secondary | ICD-10-CM | POA: Diagnosis not present

## 2012-01-07 DIAGNOSIS — I428 Other cardiomyopathies: Secondary | ICD-10-CM

## 2012-01-07 DIAGNOSIS — E785 Hyperlipidemia, unspecified: Secondary | ICD-10-CM | POA: Diagnosis not present

## 2012-01-07 DIAGNOSIS — E669 Obesity, unspecified: Secondary | ICD-10-CM

## 2012-01-07 DIAGNOSIS — I5022 Chronic systolic (congestive) heart failure: Secondary | ICD-10-CM

## 2012-01-07 DIAGNOSIS — I509 Heart failure, unspecified: Secondary | ICD-10-CM

## 2012-01-07 MED ORDER — LISINOPRIL 10 MG PO TABS: 10.0000 mg | ORAL_TABLET | Freq: Two times a day (BID) | ORAL | Status: DC

## 2012-01-07 MED ORDER — SPIRONOLACTONE 25 MG PO TABS: 25.0000 mg | ORAL_TABLET | Freq: Every day | ORAL | Status: DC

## 2012-01-07 NOTE — Patient Instructions (Addendum)
Your physician recommends that you return fasting for lab work in: 2 weeks, Lipids, Liver & Bmet  Increase Lisinopril to 10mg  twice daily.  Aldactone to 25mg  daily.  Your physician has requested that you have an echocardiogram. Echocardiography is a painless test that uses sound waves to create images of your heart. It provides your doctor with information about the size and shape of your heart and how well your heart's chambers and valves are working. This procedure takes approximately one hour. There are no restrictions for this procedure.  Your physician recommends that you schedule a follow-up appointment in: 2 months.

## 2012-01-08 DIAGNOSIS — E669 Obesity, unspecified: Secondary | ICD-10-CM | POA: Insufficient documentation

## 2012-01-08 NOTE — Assessment & Plan Note (Signed)
Euvolemic on exam, NYHA class II symptoms.  Nonischemic cardiomyopathy. - Continue current digoxin and Coreg. Continue qod Lasix.  - Increase lisinopril to 10 mg daily and spironolactone to 25 mg daily. - BMET/BNP in 2 wks.

## 2012-01-08 NOTE — Assessment & Plan Note (Signed)
Muscle aches were likely due to PMR rather than statin.  He has restarted pravastatin 40 mg daily with coenzyme Q10 200 mg daily.  Lipids/LFTs to be checked.

## 2012-01-08 NOTE — Progress Notes (Signed)
PCP: Dr. Caryl Never  70 yo with history of DM, HTN, CAD, and ischemic cardiomyopathy presents for cardiology followup. Patient had a CHF exacerbation in 2/12 and was found to have LV systolic dysfunction with EF around 20%. LHC showed RCA, ramus, and CFX disease. RCA was subtotally occluded. Cardiac MRI showed that all walls, including the inferior wall, should be viable. Patient therefore underwent opening of his chronic totally occluded RCA as well as PCI to the ramus. He received drug eluting stents and is now on Effient.  Recent repeat cardiac MRI with medical treatment of systolic CHF showed EF 32%.   He is now on CPAP for severe OSA and is less fatigued.    He had been having a lot of trouble with muscle aches in shoulder and upper back as well as legs.  Stopping statin did not help.  He was thought to have PMR.  He saw a rheumatologist who confirmed this diagnosis and started him back on prednisone.  On prednisone, his pain is much improved.  Unfortunately, however, he has had significant weight gain on prednisone.  He has restarted a statin and is tolerating it.   He is up 11 lbs since last appointment with me.  He says he is "hungry all the time."   He has had no chest pain.  No dyspnea walking on flat ground.  Mild dyspnea with hills.     Labs (3/12): TSH normal, ESR normal, HDL 34, LDL 77, LFTs normal, K 4.3, creatinine 0.9  Labs (4/12): K 4, creatinine 0.9, BNP 234 Labs (6/12): K 3.8, creatinine 0.72 Labs (9/12): K 4.4, creatinine 0.9, BNP 145, digoxin 0.8, hgbA1c 7.8 Labs (10/12): K 4, creatinine 0.7  Past Medical History:  1. HYPERTENSION  2. HYPERLIPIDEMIA  3. ECZEMA  4. RHINITIS  5. HERPES ZOSTER OPHTHALMICUS  6. ADENOCARCINOMA, PROSTATE: Status post prostatectomy in 2009. Has had some incontinence since then.  7. Diabetes mellitus type II  8. Arthritis  9. Obesity  10. GERD: rare  11. CAD: Presented with exertional dyspnea, never had chest pain. LHC (2/12) with subtotalled  proximal RCA and left to right collaterals, 90% proximal moderate-sized ramus, 95% proximal relatively small CFX, 40-50% proximal LAD. Cardiac MRI (2/12) showed EF 21%, some mild scar in basal segments but all wall segments would be expected to be viable.        a. DES x 5 (overlapping) to RCA 01/30/11        b. DES x 1 to RI 01/30/11  12. Ischemic CMP: Echo (2/12) with moderately dilated LV, EF about 20% with diffuse hypokinesis and inferior akinesis, pseudonormal diastolic function, mild MR, severe LAE, mildly decreased RV systolic function. RHC (2/12) with mean RA 12, PA 40/25, mean PCWP 26, CI 2.1.  Echo (5/12) with EF 40% (appeared worse to my eye) with posterior HK, basal inferior AK, inferoseptal AK, basal anteroseptal AK, mild MR.  Cardiac MRI was repeated and showed EF 32% (improved from 21%) and mild LV dilation (was severely dilated before) with diffuse hypokinesis and subendocardial scar in the basal inferior, basal posterior, and basal anterolateral segments.   13. Cervical OA.  14. Polymyalgia rheumatica 15. OSA: Severe on sleep study 10/12.  On CPAP.   Family History:  Father died with MI at age 32. He was an alcoholic. Mother died with cancer at around 46.   Social History:  Occupation: retired [employer] architect  Married, lives in [place]  Past smoker, quit around 1990   Review of Systems  All systems reviewed and negative except as per HPI.   Current Outpatient Prescriptions  Medication Sig Dispense Refill  . aspirin 81 MG tablet Take 81 mg by mouth daily.        . carvedilol (COREG) 12.5 MG tablet Take one and one half tablets by mouth twice daily  90 tablet  6  . Coenzyme Q10 200 MG TABS Take one daily  90 tablet  3  . digoxin (LANOXIN) 0.125 MG tablet TAKE ONE TABLET BY MOUTH EVERY DAY  30 tablet  6  . furosemide (LASIX) 20 MG tablet Take one tablet by mouth every other day  15 tablet  6  . glimepiride (AMARYL) 4 MG tablet Take 1 tablet (4 mg total) by mouth daily before  breakfast.  30 tablet  6  . lisinopril (PRINIVIL,ZESTRIL) 10 MG tablet Take 1 tablet (10 mg total) by mouth 2 (two) times daily.  60 tablet  6  . Multiple Vitamins-Minerals (CENTRUM SILVER ULTRA MENS PO) Take 1 tablet by mouth.        . nitroGLYCERIN (NITROSTAT) 0.4 MG SL tablet Place 0.4 mg under the tongue every 5 (five) minutes as needed.        . ONE TOUCH ULTRA TEST test strip TEST DAILY  100 each  1  . prasugrel (EFFIENT) 10 MG TABS Take 1 tablet (10 mg total) by mouth daily.  90 tablet  3  . pravastatin (PRAVACHOL) 40 MG tablet Take 1 tablet (40 mg total) by mouth every evening.  30 tablet  6  . predniSONE (DELTASONE) 10 MG tablet Use as directed.  30 tablet  0  . spironolactone (ALDACTONE) 25 MG tablet Take 1 tablet (25 mg total) by mouth daily.  30 tablet  3  . triamcinolone (KENALOG) 0.5 % cream Apply topically 2 (two) times daily.  45 g  2    BP 150/78  Pulse 74  Ht 5\' 10"  (1.778 m)  Wt 111.585 kg (246 lb)  BMI 35.30 kg/m2 General: NAD, obese.  Neck: Thick, JVP 7 cm, no thyromegaly or thyroid nodule.  Lungs: Clear to auscultation bilaterally with normal respiratory effort.  CV: Nondisplaced PMI. Heart regular S1/S2, no S3/S4, no murmur. 1+ bilateral ankle edema. No carotid bruit. Normal pedal pulses.  Abdomen: Soft, nontender, no hepatosplenomegaly, no distention.  Neurologic: Alert and oriented x 3.  Psych: Normal affect.  Extremities: No clubbing or cyanosis.

## 2012-01-08 NOTE — Assessment & Plan Note (Signed)
Patient was found on cardiac MRI to have viability in his inferior wall, so we elected to open his chronically totally occluded RCA.  He now has DES in the RCA and ramus.  He denies ischemic symptoms.  He will need to continue ASA and Effient (Effient for at least a year).  Continue statin, beta blocker, ACEI.

## 2012-01-08 NOTE — Assessment & Plan Note (Signed)
Significant weight gain since starting prednisone.  It does not appear to be primarily fluid-related.  His appetite has gone up considerably.  I talked to Wayne [name] and his wife about cutting back on calories and increasing exercise level.

## 2012-01-14 DIAGNOSIS — M353 Polymyalgia rheumatica: Secondary | ICD-10-CM | POA: Diagnosis not present

## 2012-01-21 ENCOUNTER — Ambulatory Visit (HOSPITAL_COMMUNITY): Payer: Medicare Other | Attending: Cardiovascular Disease | Admitting: Radiology

## 2012-01-21 ENCOUNTER — Other Ambulatory Visit (INDEPENDENT_AMBULATORY_CARE_PROVIDER_SITE_OTHER): Payer: Medicare Other | Admitting: *Deleted

## 2012-01-21 DIAGNOSIS — E669 Obesity, unspecified: Secondary | ICD-10-CM | POA: Insufficient documentation

## 2012-01-21 DIAGNOSIS — Z9119 Patient's noncompliance with other medical treatment and regimen: Secondary | ICD-10-CM | POA: Diagnosis not present

## 2012-01-21 DIAGNOSIS — I10 Essential (primary) hypertension: Secondary | ICD-10-CM | POA: Diagnosis not present

## 2012-01-21 DIAGNOSIS — G4733 Obstructive sleep apnea (adult) (pediatric): Secondary | ICD-10-CM | POA: Insufficient documentation

## 2012-01-21 DIAGNOSIS — E119 Type 2 diabetes mellitus without complications: Secondary | ICD-10-CM | POA: Insufficient documentation

## 2012-01-21 DIAGNOSIS — I2510 Atherosclerotic heart disease of native coronary artery without angina pectoris: Secondary | ICD-10-CM | POA: Insufficient documentation

## 2012-01-21 DIAGNOSIS — Z91199 Patient's noncompliance with other medical treatment and regimen due to unspecified reason: Secondary | ICD-10-CM | POA: Insufficient documentation

## 2012-01-21 DIAGNOSIS — E785 Hyperlipidemia, unspecified: Secondary | ICD-10-CM

## 2012-01-21 DIAGNOSIS — R0609 Other forms of dyspnea: Secondary | ICD-10-CM | POA: Insufficient documentation

## 2012-01-21 DIAGNOSIS — I5022 Chronic systolic (congestive) heart failure: Secondary | ICD-10-CM | POA: Diagnosis not present

## 2012-01-21 DIAGNOSIS — I2589 Other forms of chronic ischemic heart disease: Secondary | ICD-10-CM | POA: Insufficient documentation

## 2012-01-21 DIAGNOSIS — R0989 Other specified symptoms and signs involving the circulatory and respiratory systems: Secondary | ICD-10-CM | POA: Insufficient documentation

## 2012-01-21 LAB — HEPATIC FUNCTION PANEL
ALT: 23 U/L (ref 0–53)
Albumin: 3.7 g/dL (ref 3.5–5.2)
Bilirubin, Direct: 0.1 mg/dL (ref 0.0–0.3)
Total Protein: 6.3 g/dL (ref 6.0–8.3)

## 2012-01-21 LAB — BASIC METABOLIC PANEL
BUN: 19 mg/dL (ref 6–23)
CO2: 26 mEq/L (ref 19–32)
Calcium: 8.9 mg/dL (ref 8.4–10.5)
Chloride: 102 mEq/L (ref 96–112)
Creatinine, Ser: 0.9 mg/dL (ref 0.4–1.5)
Glucose, Bld: 284 mg/dL — ABNORMAL HIGH (ref 70–99)

## 2012-01-21 LAB — LIPID PANEL
Cholesterol: 191 mg/dL (ref 0–200)
Total CHOL/HDL Ratio: 5
Triglycerides: 165 mg/dL — ABNORMAL HIGH (ref 0.0–149.0)

## 2012-01-26 ENCOUNTER — Telehealth: Payer: Self-pay | Admitting: *Deleted

## 2012-01-26 DIAGNOSIS — E785 Hyperlipidemia, unspecified: Secondary | ICD-10-CM

## 2012-01-26 DIAGNOSIS — I2510 Atherosclerotic heart disease of native coronary artery without angina pectoris: Secondary | ICD-10-CM

## 2012-01-26 MED ORDER — PRAVASTATIN SODIUM 80 MG PO TABS: 80.0000 mg | ORAL_TABLET | Freq: Every evening | ORAL | Status: DC

## 2012-01-26 NOTE — Telephone Encounter (Signed)
Notes Recorded by Jacqlyn Krauss, RN on 01/26/2012 at 12:05 PM Discussed with pt. He will increase pravastatin to 80mg  daily. He will return for fasting lipid/liver profile 03/23/12. ------  Notes Recorded by Marca Ancona, MD on 01/26/2012 at 12:01 AM LDL too high. Increase pravastatin to 80 mg daily with lipids in 2 months.

## 2012-01-27 ENCOUNTER — Telehealth: Payer: Self-pay | Admitting: Family Medicine

## 2012-01-27 DIAGNOSIS — E119 Type 2 diabetes mellitus without complications: Secondary | ICD-10-CM

## 2012-01-27 MED ORDER — GLIMEPIRIDE 4 MG PO TABS: 4.0000 mg | ORAL_TABLET | Freq: Two times a day (BID) | ORAL | Status: DC

## 2012-01-27 NOTE — Telephone Encounter (Signed)
We can increase to 4 mg twice daily

## 2012-01-27 NOTE — Telephone Encounter (Signed)
Pt informed, he will keep [place] updated on BS, new RX sent to pharmacy

## 2012-01-27 NOTE — Telephone Encounter (Signed)
Pt is on predisone prescribed by dr Dierdre Forth. Pt stated predisone is running up blood sugar up. Pt would like to double up on glimepiride 4mg 

## 2012-03-09 ENCOUNTER — Ambulatory Visit (INDEPENDENT_AMBULATORY_CARE_PROVIDER_SITE_OTHER): Payer: Medicare Other | Admitting: Cardiology

## 2012-03-09 ENCOUNTER — Encounter: Payer: Self-pay | Admitting: Cardiology

## 2012-03-09 VITALS — BP 122/76 | HR 73 | Ht 70.0 in | Wt 250.8 lb

## 2012-03-09 DIAGNOSIS — E785 Hyperlipidemia, unspecified: Secondary | ICD-10-CM | POA: Diagnosis not present

## 2012-03-09 DIAGNOSIS — R0602 Shortness of breath: Secondary | ICD-10-CM

## 2012-03-09 DIAGNOSIS — I509 Heart failure, unspecified: Secondary | ICD-10-CM

## 2012-03-09 DIAGNOSIS — I2510 Atherosclerotic heart disease of native coronary artery without angina pectoris: Secondary | ICD-10-CM

## 2012-03-09 DIAGNOSIS — I5022 Chronic systolic (congestive) heart failure: Secondary | ICD-10-CM

## 2012-03-09 MED ORDER — FUROSEMIDE 20 MG PO TABS: 20.0000 mg | ORAL_TABLET | ORAL | Status: DC | PRN

## 2012-03-09 NOTE — Patient Instructions (Signed)
Your physician recommends that you schedule a follow-up appointment in: 3 months.    Your physician recommends that you return for lab work in: 03/23/2012  (liver, lipid, bmet, bnp, digoxin)  Your physician has recommended you make the following change in your medication: Stop your daily Lasix and take as needed only

## 2012-03-10 NOTE — Assessment & Plan Note (Signed)
Euvolemic on exam, NYHA class II symptoms.  Nonischemic cardiomyopathy.  EF improved to 45% on last echo, not in ICD range.  - Continue current digoxin, Coreg, spironolactone, and lisinopril.  - BMET/BNP and digoxin level in 2 wks. - He may stop Lasix at this time.  He will follow weight closely and use Lasix as prn.

## 2012-03-10 NOTE — Assessment & Plan Note (Signed)
Patient was found on cardiac MRI to have viability in his inferior wall, so we elected to open his chronically totally occluded RCA.  He now has DES in the RCA and ramus.  He denies ischemic symptoms.  He will need to continue ASA and Effient.  He has multiple overlapping DES in the RCA.  He will take Effient for 6-12 more months.  After than, I may switch him to clopidogrel.  Continue statin, beta blocker, ACEI.

## 2012-03-10 NOTE — Assessment & Plan Note (Signed)
Goal LDL < 70.  Pravastatin recently increased.  Will get lipids/LFTs next month.

## 2012-03-10 NOTE — Progress Notes (Signed)
PCP: Dr. Caryl Never  71 yo with history of DM, HTN, CAD, and ischemic cardiomyopathy presents for cardiology followup. Patient had a CHF exacerbation in 2/12 and was found to have LV systolic dysfunction with EF around 20%. LHC showed RCA, ramus, and CFX disease. RCA was subtotally occluded. Cardiac MRI showed that all walls, including the inferior wall, should be viable. Patient therefore underwent opening of his chronic totally occluded RCA as well as PCI to the ramus. He received drug eluting stents and is now on Effient.  Recent repeat cardiac MRI with medical treatment of systolic CHF showed EF 32%.   He is now on CPAP for severe OSA and is less fatigued.  Last echo in 2/13 showed EF improved to 45% with moderate LV dilation and mild LV hypertrophy.   He had been having a lot of trouble with muscle aches in shoulder and upper back as well as legs.  Stopping statin did not help.  He was thought to have PMR.  He saw a rheumatologist who confirmed this diagnosis and started him back on prednisone.  On prednisone, his pain is much improved.  Unfortunately, however, he has had significant weight gain on prednisone, and his blood glucose has been poorly controlled.  He has restarted a statin and is tolerating it.   He is up 4 lbs since last appointment with me.  He says he is "hungry all the time."   He is slowly tapering off the prednisone.  He has had no chest pain.  No dyspnea walking on flat ground.  Mild dyspnea with hills.  He has been walking 15-20 minutes daily on a treadmill for exercise.    Labs (3/12): TSH normal, ESR normal, HDL 34, LDL 77, LFTs normal, K 4.3, creatinine 0.9  Labs (4/12): K 4, creatinine 0.9, BNP 234 Labs (6/12): K 3.8, creatinine 0.72 Labs (9/12): K 4.4, creatinine 0.9, BNP 145, digoxin 0.8, hgbA1c 7.8 Labs (10/12): K 4, creatinine 0.7 Labs (2/13): K 4.5, creatinine 0.9, LDL 116, HDL 42  ECG: NSR, LVH, old inferior MI  Past Medical History:  1. HYPERTENSION  2.  HYPERLIPIDEMIA  3. ECZEMA  4. RHINITIS  5. HERPES ZOSTER OPHTHALMICUS  6. ADENOCARCINOMA, PROSTATE: Status post prostatectomy in 2009. Has had some incontinence since then.  7. Diabetes mellitus type II  8. Arthritis  9. Obesity  10. GERD: rare  11. CAD: Presented with exertional dyspnea, never had chest pain. LHC (2/12) with subtotalled proximal RCA and left to right collaterals, 90% proximal moderate-sized ramus, 95% proximal relatively small CFX, 40-50% proximal LAD. Cardiac MRI (2/12) showed EF 21%, some mild scar in basal segments but all wall segments would be expected to be viable.        a. DES x 5 (overlapping) to RCA 01/30/11        b. DES x 1 to RI 01/30/11  12. Ischemic CMP: Echo (2/12) with moderately dilated LV, EF about 20% with diffuse hypokinesis and inferior akinesis, pseudonormal diastolic function, mild MR, severe LAE, mildly decreased RV systolic function. RHC (2/12) with mean RA 12, PA 40/25, mean PCWP 26, CI 2.1.  Echo (5/12) with EF 40% (appeared worse to my eye) with posterior HK, basal inferior AK, inferoseptal AK, basal anteroseptal AK, mild MR.  Cardiac MRI was repeated and showed EF 32% (improved from 21%) and mild LV dilation (was severely dilated before) with diffuse hypokinesis and subendocardial scar in the basal inferior, basal posterior, and basal anterolateral segments.  Echo (2/13) with EF  45%, moderate LV dilation, mild LVH.  13. Cervical OA.  14. Polymyalgia rheumatica 15. OSA: Severe on sleep study 10/12.  On CPAP.   Family History:  Father died with MI at age 71. He was an alcoholic. Mother died with cancer at around 61.   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 aspirin 81 MG tablet Take 81 mg by mouth daily.        . carvedilol (COREG) 12.5 MG tablet Take one and one half tablets by mouth twice daily  90  tablet  6  . Coenzyme Q10 200 MG TABS Take one daily  90 tablet  3  . digoxin (LANOXIN) 0.125 MG tablet TAKE ONE TABLET BY MOUTH EVERY DAY  30 tablet  6  . furosemide (LASIX) 20 MG tablet Take 1 tablet (20 mg total) by mouth as needed.  15 tablet  6  . glimepiride (AMARYL) 4 MG tablet Take 1 tablet (4 mg total) by mouth 2 (two) times daily.  60 tablet  6  . lisinopril (PRINIVIL,ZESTRIL) 10 MG tablet Take 1 tablet (10 mg total) by mouth 2 (two) times daily.  60 tablet  6  . Multiple Vitamins-Minerals (CENTRUM SILVER ULTRA MENS PO) Take 1 tablet by mouth.        . nitroGLYCERIN (NITROSTAT) 0.4 MG SL tablet Place 0.4 mg under the tongue every 5 (five) minutes as needed.        . ONE TOUCH ULTRA TEST test strip TEST DAILY  100 each  1  . prasugrel (EFFIENT) 10 MG TABS Take 1 tablet (10 mg total) by mouth daily.  90 tablet  3  . pravastatin (PRAVACHOL) 80 MG tablet Take 1 tablet (80 mg total) by mouth every evening.  30 tablet  6  . predniSONE (DELTASONE) 10 MG tablet Use as directed.  30 tablet  0  . spironolactone (ALDACTONE) 25 MG tablet Take 1 tablet (25 mg total) by mouth daily.  30 tablet  3  . triamcinolone (KENALOG) 0.5 % cream Apply topically 2 (two) times daily.  45 g  2    BP 122/76  Pulse 73  Ht 5\' 10"  (1.778 m)  Wt 250 lb 12.8 oz (113.762 kg)  BMI 35.99 kg/m2 General: NAD, obese.  Neck: Thick, JVP 7 cm, no thyromegaly or thyroid nodule.  Lungs: Clear to auscultation bilaterally with normal respiratory effort.  CV: Nondisplaced PMI. Heart regular S1/S2, no S3/S4, no murmur. 1+ bilateral edema 1/2 way up lower legs. No carotid bruit. Normal pedal pulses.  Abdomen: Soft, nontender, no hepatosplenomegaly, no distention.  Neurologic: Alert and oriented x 3.  Psych: Normal affect.  Extremities: No clubbing or cyanosis.

## 2012-03-15 ENCOUNTER — Telehealth: Payer: Self-pay | Admitting: *Deleted

## 2012-03-15 NOTE — Telephone Encounter (Signed)
Pt's BS was 200 today with his steroids and wants to see Dr. Caryl Never to discuss long term steroids. Will see him on WED.

## 2012-03-17 ENCOUNTER — Ambulatory Visit (INDEPENDENT_AMBULATORY_CARE_PROVIDER_SITE_OTHER): Payer: Medicare Other | Admitting: Family Medicine

## 2012-03-17 ENCOUNTER — Encounter: Payer: Self-pay | Admitting: Family Medicine

## 2012-03-17 VITALS — BP 140/88 | Temp 98.2°F | Wt 251.0 lb

## 2012-03-17 DIAGNOSIS — E119 Type 2 diabetes mellitus without complications: Secondary | ICD-10-CM

## 2012-03-17 DIAGNOSIS — M353 Polymyalgia rheumatica: Secondary | ICD-10-CM

## 2012-03-17 LAB — HEMOGLOBIN A1C: Hgb A1c MFr Bld: 10.7 % — ABNORMAL HIGH (ref 4.6–6.5)

## 2012-03-17 MED ORDER — METFORMIN HCL 500 MG PO TABS: 500.0000 mg | ORAL_TABLET | Freq: Two times a day (BID) | ORAL | Status: DC

## 2012-03-17 NOTE — Patient Instructions (Signed)
Decrease glimepiride to one daily Start metformin one twice daily. Continue to work on weight loss.

## 2012-03-17 NOTE — Progress Notes (Signed)
  Subjective:    Patient ID: Wayne [name], male    DOB: May 22, 1941, 71 y.o.   MRN: [id number]  HPI  Patient here to discuss blood sugar issues. He has history of type 2 diabetes, CAD, hypertension, hyperlipidemia, obesity, polymyalgia rheumatica, obstructive sleep apnea and cardiomyopathy with history of systolic heart failure. Recent echo revealed improvement in ejection fraction 45%. Patient currently on 7 mg of prednisone daily and slowly tapering-per rheumatology. Blood sugars poorly controlled with fastings mostly around upper 100s to low 200s. Some urine frequency. No consistent exercise. Currently takes glyburide 4 mg twice daily. He has not had any history of chronic kidney disease.   Past Medical History  Diagnosis Date  . DM 06/20/2009  . HYPERLIPIDEMIA 06/20/2009  . HYPERTENSION 06/20/2009  . CORONARY ATHEROSCLEROSIS NATIVE CORONARY ARTERY 01/27/2011    s/p PCI 2/12  . Herpes zoster ophthalmicus   . Arthritis   . Obesity   . GERD (gastroesophageal reflux disease)   . Ischemic cardiomyopathy 2/12    EF 31%  . Adenocarcinoma of prostate     s/p seed implants  . Polymyalgia rheumatica    Past Surgical History  Procedure Date  . Prostate surgery     cancer, seed implant    reports that he quit smoking about 23 years ago. His smoking use included Cigarettes. He has a 6 pack-year smoking history. He does not have any smokeless tobacco history on file. He reports that he does not drink alcohol or use illicit drugs. family history includes Alcohol abuse in his father; Cancer (age of onset:80) in his mother; and Heart disease (age of onset:61) in his father. Allergies  Allergen Reactions  . Atorvastatin     REACTION: sore legs      Review of Systems  Constitutional: Positive for unexpected weight change (weight gain). Negative for fatigue.  Eyes: Negative for visual disturbance.  Respiratory: Negative for cough and shortness of breath.   Cardiovascular: Negative for chest pain,  palpitations and leg swelling.  Genitourinary: Negative for dysuria.  Neurological: Negative for syncope.       Objective:   Physical Exam  Constitutional: He appears well-developed and well-nourished.  HENT:  Mouth/Throat: Oropharynx is clear and moist.  Neck: Neck supple. No thyromegaly present.  Cardiovascular: Normal rate and regular rhythm.   Pulmonary/Chest: Effort normal and breath sounds normal. No respiratory distress. He has no wheezes. He has no rales.  Musculoskeletal: He exhibits no edema.          Assessment & Plan:  Type 2 diabetes.  Poorly controlled and exacerbated by recent prednisone and weight gain. Reduce glimepiride 4 mg once daily (sulfonylurea might be exacerbating his weight gain). Start back metformin 500 mg twice a day. He does not have any renal insufficiency to contraindicate at this time. We discussed other possible options that would help with weight control such as Victoza.  At this point he will try the metformin, gradually reducing prednisone, reducing Amaryl (to assist with weight control), and gradual increase in exercise. Recheck in one month. Reassess A1c today PMR stable on low dose prednisone.

## 2012-03-18 NOTE — Progress Notes (Signed)
Quick Note:  Pt informed, he has appt in 1 month ______

## 2012-03-21 ENCOUNTER — Other Ambulatory Visit: Payer: Self-pay | Admitting: Cardiology

## 2012-03-22 ENCOUNTER — Other Ambulatory Visit (INDEPENDENT_AMBULATORY_CARE_PROVIDER_SITE_OTHER): Payer: Medicare Other

## 2012-03-22 DIAGNOSIS — R0602 Shortness of breath: Secondary | ICD-10-CM

## 2012-03-22 DIAGNOSIS — R310 Gross hematuria: Secondary | ICD-10-CM | POA: Insufficient documentation

## 2012-03-22 DIAGNOSIS — I2510 Atherosclerotic heart disease of native coronary artery without angina pectoris: Secondary | ICD-10-CM | POA: Diagnosis not present

## 2012-03-22 DIAGNOSIS — C61 Malignant neoplasm of prostate: Secondary | ICD-10-CM | POA: Diagnosis not present

## 2012-03-22 DIAGNOSIS — E785 Hyperlipidemia, unspecified: Secondary | ICD-10-CM | POA: Diagnosis not present

## 2012-03-22 DIAGNOSIS — R972 Elevated prostate specific antigen [PSA]: Secondary | ICD-10-CM | POA: Diagnosis not present

## 2012-03-22 DIAGNOSIS — K921 Melena: Secondary | ICD-10-CM | POA: Diagnosis not present

## 2012-03-22 DIAGNOSIS — I5022 Chronic systolic (congestive) heart failure: Secondary | ICD-10-CM

## 2012-03-22 DIAGNOSIS — Z79899 Other long term (current) drug therapy: Secondary | ICD-10-CM | POA: Diagnosis not present

## 2012-03-22 DIAGNOSIS — N529 Male erectile dysfunction, unspecified: Secondary | ICD-10-CM | POA: Diagnosis not present

## 2012-03-22 LAB — BASIC METABOLIC PANEL
BUN: 17 mg/dL (ref 6–23)
Creatinine, Ser: 0.9 mg/dL (ref 0.4–1.5)
GFR: 93.9 mL/min (ref >60.00)
Glucose, Bld: 284 mg/dL — ABNORMAL HIGH (ref 70–99)
Potassium: 4.9 mEq/L (ref 3.5–5.1)

## 2012-03-22 LAB — HEPATIC FUNCTION PANEL
AST: 22 U/L (ref 0–37)
Albumin: 4 g/dL (ref 3.5–5.2)
Total Bilirubin: 0.7 mg/dL (ref 0.3–1.2)

## 2012-03-22 LAB — LIPID PANEL
Cholesterol: 167 mg/dL (ref 0–200)
Triglycerides: 151 mg/dL — ABNORMAL HIGH (ref 0.0–149.0)
VLDL: 30.2 mg/dL (ref 0.0–40.0)

## 2012-03-23 ENCOUNTER — Other Ambulatory Visit: Payer: Medicare Other

## 2012-03-23 LAB — DIGOXIN LEVEL: Digoxin Level: 0.2 ng/mL — ABNORMAL LOW (ref 0.8–2.0)

## 2012-03-28 DIAGNOSIS — I10 Essential (primary) hypertension: Secondary | ICD-10-CM | POA: Diagnosis not present

## 2012-03-28 DIAGNOSIS — S8010XA Contusion of unspecified lower leg, initial encounter: Secondary | ICD-10-CM | POA: Diagnosis not present

## 2012-03-28 DIAGNOSIS — M539 Dorsopathy, unspecified: Secondary | ICD-10-CM | POA: Diagnosis not present

## 2012-03-28 DIAGNOSIS — Z8546 Personal history of malignant neoplasm of prostate: Secondary | ICD-10-CM | POA: Diagnosis not present

## 2012-03-28 DIAGNOSIS — M79609 Pain in unspecified limb: Secondary | ICD-10-CM | POA: Diagnosis not present

## 2012-03-28 DIAGNOSIS — Z888 Allergy status to other drugs, medicaments and biological substances status: Secondary | ICD-10-CM | POA: Diagnosis not present

## 2012-03-28 DIAGNOSIS — E119 Type 2 diabetes mellitus without complications: Secondary | ICD-10-CM | POA: Diagnosis not present

## 2012-03-28 DIAGNOSIS — Z9861 Coronary angioplasty status: Secondary | ICD-10-CM | POA: Diagnosis not present

## 2012-04-02 ENCOUNTER — Telehealth: Payer: Self-pay | Admitting: Cardiology

## 2012-04-02 NOTE — Telephone Encounter (Signed)
Pt aware of lab results. He tried Lipitor in the past and "it hurt my legs too much". I will forward to Dr. Shirlee Latch & Marja Kays RN

## 2012-04-02 NOTE — Telephone Encounter (Signed)
Pt rtn call to [facility] from yesterday to get lab results

## 2012-04-05 ENCOUNTER — Telehealth: Payer: Self-pay | Admitting: Cardiology

## 2012-04-05 NOTE — Telephone Encounter (Signed)
Spoke with pt's wife. She is aware of Dr Alford Highland recommendation for pt to continue pravastatin 80mg  daily.

## 2012-04-05 NOTE — Telephone Encounter (Signed)
Spoke with pt's wife. Pt out of town last week. Pt developed pain and swelling in left leg. Pt went to ED had ultrasound done of left leg. PT told no clot in left leg. Pt was given pain med but not other changes were recommended by ED. Pt travelled home by car yesterday and kept his left leg elevated on the car seat. Pt's wife states the calf of pt's leg is more swollen today. Pt is unable to put any weight on his left leg. I will review with DOD.

## 2012-04-05 NOTE — Telephone Encounter (Signed)
Pt having pain in leg, was seen at a hospital out of town last Sunday and was told he vein that was leaking blood per an [place], was better after elevating it for a couple days, but then went to [facility]  now getting worse, did use a wheelchair, pls call [phone]

## 2012-04-05 NOTE — Telephone Encounter (Signed)
Have Wayne [name] stay on pravastatin 80 if he cannot take atorvastatin.  He needs to watch his diet carefully.

## 2012-04-05 NOTE — Telephone Encounter (Signed)
Reviewed with Dr [place]. He recommended pt follow-up with his PCP if clot has been ruled out. Spoke with pt's wife and she is aware of Dr Elvis Coil recommendation.

## 2012-04-06 ENCOUNTER — Emergency Department (HOSPITAL_COMMUNITY): Payer: Medicare Other

## 2012-04-06 ENCOUNTER — Ambulatory Visit (INDEPENDENT_AMBULATORY_CARE_PROVIDER_SITE_OTHER): Payer: Medicare Other | Admitting: Family Medicine

## 2012-04-06 ENCOUNTER — Encounter: Payer: Self-pay | Admitting: Family Medicine

## 2012-04-06 ENCOUNTER — Emergency Department (HOSPITAL_COMMUNITY)
Admission: EM | Admit: 2012-04-06 | Discharge: 2012-04-06 | Disposition: A | Discharge status: Home / Self Care | Payer: Medicare Other | Attending: Emergency Medicine | Admitting: Emergency Medicine

## 2012-04-06 ENCOUNTER — Encounter (HOSPITAL_COMMUNITY): Payer: Self-pay | Admitting: *Deleted

## 2012-04-06 VITALS — BP 148/80 | HR 99 | Temp 98.2°F | Wt 246.0 lb

## 2012-04-06 DIAGNOSIS — S8010XA Contusion of unspecified lower leg, initial encounter: Secondary | ICD-10-CM | POA: Insufficient documentation

## 2012-04-06 DIAGNOSIS — R0602 Shortness of breath: Secondary | ICD-10-CM | POA: Diagnosis not present

## 2012-04-06 DIAGNOSIS — E669 Obesity, unspecified: Secondary | ICD-10-CM | POA: Insufficient documentation

## 2012-04-06 DIAGNOSIS — E785 Hyperlipidemia, unspecified: Secondary | ICD-10-CM | POA: Diagnosis not present

## 2012-04-06 DIAGNOSIS — M7989 Other specified soft tissue disorders: Secondary | ICD-10-CM | POA: Diagnosis not present

## 2012-04-06 DIAGNOSIS — I2510 Atherosclerotic heart disease of native coronary artery without angina pectoris: Secondary | ICD-10-CM | POA: Diagnosis not present

## 2012-04-06 DIAGNOSIS — E119 Type 2 diabetes mellitus without complications: Secondary | ICD-10-CM | POA: Diagnosis not present

## 2012-04-06 DIAGNOSIS — X58XXXA Exposure to other specified factors, initial encounter: Secondary | ICD-10-CM | POA: Insufficient documentation

## 2012-04-06 DIAGNOSIS — M79609 Pain in unspecified limb: Secondary | ICD-10-CM | POA: Insufficient documentation

## 2012-04-06 DIAGNOSIS — R9389 Abnormal findings on diagnostic imaging of other specified body structures: Secondary | ICD-10-CM | POA: Diagnosis not present

## 2012-04-06 DIAGNOSIS — M79606 Pain in leg, unspecified: Secondary | ICD-10-CM

## 2012-04-06 DIAGNOSIS — K219 Gastro-esophageal reflux disease without esophagitis: Secondary | ICD-10-CM | POA: Insufficient documentation

## 2012-04-06 DIAGNOSIS — I10 Essential (primary) hypertension: Secondary | ICD-10-CM | POA: Diagnosis not present

## 2012-04-06 DIAGNOSIS — J984 Other disorders of lung: Secondary | ICD-10-CM | POA: Diagnosis not present

## 2012-04-06 DIAGNOSIS — R079 Chest pain, unspecified: Secondary | ICD-10-CM | POA: Diagnosis not present

## 2012-04-06 MED ORDER — OXYCODONE-ACETAMINOPHEN 5-325 MG PO TABS: 1.0000 | ORAL_TABLET | ORAL | Status: DC | PRN

## 2012-04-06 NOTE — ED Notes (Signed)
Pt reports LLE pain that began approx 2 weeks ago - pt saw PCP for this today and has had vasc ultrasound and was referred to ED to r/o DVT. Pt states he has increased pain w/ ambulation to LLE - pt denies any recent trips or sedentary activities, pt denies shortness of breath or chest pain. BS clear and equal bilat. Pt's spouse states x1 week ago pt was dx w/ internal venous bleeding to LLE and was given pain medications and advised to rest.

## 2012-04-06 NOTE — ED Notes (Signed)
Patient transported to X-ray 

## 2012-04-06 NOTE — Progress Notes (Signed)
  Subjective:    Patient ID: Wayne [name], male    DOB: 25-Jan-1941, 71 y.o.   MRN: [id number]  HPI Here with his wife for 10 days of swelling and pain in the left leg. He [place] when he developed some mild swelling and pain in the left calf on 03-26-12. No trauma, no excessive exertion involved. This steadily worsened so he went to an ER in [place], [place]. He had an US done on the leg, and he was told that he did not have a clot on the leg but that he was "leaking blood from a vessel". I am not sure exactly what they meant by that. Anyway they then returned home, but the pain has continued to get wore and worse in the leg. The pain starts in the posterior thigh just above the knee and then runs down the back of the knee in to the calf down to the Achilles. He has had some URI symptoms for a week including a PND, chest congestion, and a dry cough, but he denies any chest pain or SOB. He has tried tramadol and Vicodin for the pain with no relief. He is on Effient and aspirin for a hx of CAD with stents in place.    Review of Systems  Constitutional: Negative.   HENT: Positive for congestion and postnasal drip.   Eyes: Negative.   Respiratory: Positive for cough. Negative for shortness of breath and wheezing.   Cardiovascular: Positive for leg swelling. Negative for chest pain and palpitations.       Objective:   Physical Exam  Constitutional:       In pain, in a wheelchair, unable to bear weight on the leg  Neck: Neck supple. No thyromegaly present.  Cardiovascular: Normal rate, regular rhythm, normal heart sounds and intact distal pulses.  Exam reveals no gallop and no friction rub.   No murmur heard. Pulmonary/Chest: Effort normal and breath sounds normal. No respiratory distress. He has no wheezes. He has no rales.  Musculoskeletal:       The left leg is quite tender in the posterior thigh proximal to the knee, and the  tenderness extends through the popliteal space down the entire calf. He has a palpable cord down the length of the calf. Homans sign is very positive. He has 2+ edema of the calf and foot.  Lymphadenopathy:    He has no cervical adenopathy.          Assessment & Plan:  I am very concerned that he has a DVT in the leg, so his wife will drive him directly to [facility] ER for further evaluation and treatment. He needs either a venous doppler or a CT of the leg, and possibly a chest CT as well.

## 2012-04-06 NOTE — ED Notes (Signed)
The pt has had lt leg pain since last Friday and he is afraid that he has has a clot.  He saw dr fry at [facility] 2 hours ago and was sent here to r/o dvt

## 2012-04-06 NOTE — Progress Notes (Signed)
*  PRELIMINARY RESULTS* Vascular Ultrasound Left lower extremity venous duplex has been completed.  Preliminary findings: Left= No evidence of DVT. Hematoma in proximal medial calf.  Farrel Demark, RDMS 04/06/2012, 7:21 PM

## 2012-04-06 NOTE — ED Notes (Signed)
Patient currently getting dopplers done in triage room.

## 2012-04-06 NOTE — Discharge Instructions (Signed)
Hematoma   A hematoma is a pocket of blood. The pocket of blood can turn into a hard, painful lump under the skin. Your skin may turn blue or yellow. Most hematomas get better in a few days to weeks. Some hematomas are serious and need medical care. Hematomas can be very small or very big.   HOME CARE   Put ice on the injured area.   Put ice in a plastic bag.   Place a towel between your skin and the bag.   Leave the ice on for 15 to 20 minutes, 3 to 4 times a day for the first 1 to 2 days.   After the first 2 days, switch to using warm packs on the injured area.   Raise (elevate) the injured area to lessen pain and puffiness (swelling). You may also wrap the area with an elastic bandage. Make sure the bandage is not wrapped too tight.   If you have a painful hematoma on your leg or foot, you may use crutches for a couple days.   Only take medicines as told by your doctor.   GET HELP RIGHT AWAY IF:   Your pain gets worse.   Your pain is not controlled with medicine.   You have a fever.   Your puffiness gets worse.   Your skin turns more blue or yellow.   Your skin over the hematoma breaks or starts bleeding.   MAKE SURE YOU:   Understand these instructions.   Will watch your condition.   Will get help right away if you are not doing well or get worse.   Document Released: 01/08/2005 Document Revised: 11/20/2011 Document Reviewed: 08/04/2011   Exit[employer] Patient Information 2012 [employer].

## 2012-04-06 NOTE — ED Provider Notes (Signed)
History    71 year old male with left lower leg pain. Gradual onset about 2 weeks ago. Achy pain at rest and worse with movement. Pain is in the calf area. Mild swelling. Denies trauma. No history of blood. No acute numbness or tingling. No rash. No n/v. No CP or SOB.  CSN: [id number]  Arrival date & time 04/06/12  1732   First MD Initiated Contact with Patient 04/06/12 2038      Chief Complaint  Patient presents with  . lt leg pain     (Consider location/radiation/quality/duration/timing/severity/associated sxs/prior treatment) HPI  Past Medical History  Diagnosis Date  . DM 06/20/2009  . HYPERLIPIDEMIA 06/20/2009  . HYPERTENSION 06/20/2009  . CORONARY ATHEROSCLEROSIS NATIVE CORONARY ARTERY 01/27/2011    s/p PCI 2/12  . Herpes zoster ophthalmicus   . Arthritis   . Obesity   . GERD (gastroesophageal reflux disease)   . Ischemic cardiomyopathy 2/12    EF 31%  . Adenocarcinoma of prostate     s/p seed implants  . Polymyalgia rheumatica     Past Surgical History  Procedure Date  . Prostate surgery     cancer, seed implant    Family History  Problem Relation Age of Onset  . Cancer Mother 34    unknown CA  . Heart disease Father 75    MI, died  . Alcohol abuse Father     History  Substance Use Topics  . Smoking status: Former Smoker -- 0.3 packs/day for 20 years    Types: Cigarettes    Quit date: 02/23/1989  . Smokeless tobacco: Never Used  . Alcohol Use: No      Review of Systems   Review of symptoms negative unless otherwise noted in HPI.   Allergies  Atorvastatin  Home Medications   Current Outpatient Rx  Name Route Sig Dispense Refill  . ASPIRIN 81 MG PO TABS Oral Take 81 mg by mouth daily.      Marland Kitchen CARVEDILOL 12.5 MG PO TABS  Take one and one half tablets by mouth twice daily 90 tablet 6  . COENZYME Q10 200 MG PO TABS  Take one daily 90 tablet 3  . DIGOXIN 0.125 MG PO TABS  TAKE 1 TABLET EVERY DAY 30 tablet 6  . FUROSEMIDE 20 MG PO TABS Oral Take 1  tablet (20 mg total) by mouth as needed. 15 tablet 6  . GLIMEPIRIDE 4 MG PO TABS Oral Take 1 tablet (4 mg total) by mouth 2 (two) times daily. 60 tablet 6  . LISINOPRIL 10 MG PO TABS Oral Take 1 tablet (10 mg total) by mouth 2 (two) times daily. 60 tablet 6  . METFORMIN HCL 500 MG PO TABS Oral Take 1 tablet (500 mg total) by mouth 2 (two) times daily with a meal. 60 tablet 3  . CENTRUM SILVER ULTRA MENS PO Oral Take 1 tablet by mouth.      Marland Kitchen NITROGLYCERIN 0.4 MG SL SUBL Sublingual Place 0.4 mg under the tongue every 5 (five) minutes as needed.      Marland Kitchen PRASUGREL HCL 10 MG PO TABS Oral Take 1 tablet (10 mg total) by mouth daily. 90 tablet 3  . PRAVASTATIN SODIUM 80 MG PO TABS Oral Take 1 tablet (80 mg total) by mouth every evening. 30 tablet 6  . PREDNISONE 10 MG PO TABS  Use as directed. 30 tablet 0  . SPIRONOLACTONE 25 MG PO TABS Oral Take 1 tablet (25 mg total) by mouth daily. 30 tablet 3  .  TRIAMCINOLONE ACETONIDE 0.5 % EX CREA Topical Apply topically 2 (two) times daily. 45 g 2    BP 113/58  Pulse 102  Temp(Src) 99 F (37.2 C) (Oral)  Resp 18  SpO2 95%  Physical Exam  Nursing note and vitals reviewed. Constitutional: He appears well-developed and well-nourished. No distress.  HENT:  Head: Normocephalic and atraumatic.  Eyes: Conjunctivae are normal. Right eye exhibits no discharge. Left eye exhibits no discharge.  Neck: Neck supple.  Cardiovascular: Normal rate, regular rhythm and normal heart sounds.  Exam reveals no gallop and no friction rub.   No murmur heard. Pulmonary/Chest: Effort normal and breath sounds normal. No respiratory distress.  Abdominal: Soft. He exhibits no distension. There is no tenderness.  Musculoskeletal: He exhibits tenderness.       L calf tender to palpation. Mild swelling of calf to ankle. No overlying skin changes. Neurovascularly intact distally. No knee tenderness or effusion. No singificant pain with ROM of knee.  Neurological: He is alert.  Skin:  Skin is warm and dry.  Psychiatric: He has a normal mood and affect. His behavior is normal. Thought content normal.    ED Course  Procedures (including critical care time)  Labs Reviewed - No data to display Dg Chest 2 View  04/06/2012  *RADIOLOGY REPORT*  Clinical Data: Shortness of breath.  Chest pain.  Hypertension.  CHEST - 2 VIEW  Comparison: 01/17/2011  Findings: Mild scarring again seen in the lung bases.  Lungs otherwise clear.  No evidence of pulmonary infiltrate or edema.  No evidence of pleural effusion.  Heart size is within normal limits. No mass or lymphadenopathy identified.  IMPRESSION: Mild bibasilar scarring.  No active disease.  Original Report Authenticated By: Danae Orleans, M.D.     1. Hematoma of leg       MDM  71 year old male with atraumatic left lower extremity pain. Ultrasound was negative for DVT but significant for a calf hematoma. Patient is on effient and ASA. There is no evidence of compartment syndrome. He is neurovascularly intact distally. Plan as needed pain medication and supportive care. Return precautions were discussed. Outpatient followup otherwise.        Raeford Razor, MD 04/11/12 [phone]

## 2012-04-06 NOTE — ED Notes (Signed)
Rx given x1 D/c instructions reviewed w/ pt and family - pt and family deny any further questions or concerns at present.  

## 2012-04-11 ENCOUNTER — Emergency Department (INDEPENDENT_AMBULATORY_CARE_PROVIDER_SITE_OTHER): Payer: Medicare Other

## 2012-04-11 ENCOUNTER — Emergency Department (HOSPITAL_BASED_OUTPATIENT_CLINIC_OR_DEPARTMENT_OTHER)
Admission: EM | Admit: 2012-04-11 | Discharge: 2012-04-11 | Disposition: A | Discharge status: Home / Self Care | Payer: Medicare Other | Attending: Emergency Medicine | Admitting: Emergency Medicine

## 2012-04-11 ENCOUNTER — Encounter (HOSPITAL_BASED_OUTPATIENT_CLINIC_OR_DEPARTMENT_OTHER): Payer: Self-pay | Admitting: *Deleted

## 2012-04-11 DIAGNOSIS — K219 Gastro-esophageal reflux disease without esophagitis: Secondary | ICD-10-CM | POA: Insufficient documentation

## 2012-04-11 DIAGNOSIS — J3489 Other specified disorders of nose and nasal sinuses: Secondary | ICD-10-CM

## 2012-04-11 DIAGNOSIS — I2510 Atherosclerotic heart disease of native coronary artery without angina pectoris: Secondary | ICD-10-CM | POA: Insufficient documentation

## 2012-04-11 DIAGNOSIS — Z87891 Personal history of nicotine dependence: Secondary | ICD-10-CM | POA: Diagnosis not present

## 2012-04-11 DIAGNOSIS — J069 Acute upper respiratory infection, unspecified: Secondary | ICD-10-CM | POA: Insufficient documentation

## 2012-04-11 DIAGNOSIS — R0989 Other specified symptoms and signs involving the circulatory and respiratory systems: Secondary | ICD-10-CM | POA: Diagnosis not present

## 2012-04-11 DIAGNOSIS — E785 Hyperlipidemia, unspecified: Secondary | ICD-10-CM | POA: Diagnosis not present

## 2012-04-11 DIAGNOSIS — X58XXXA Exposure to other specified factors, initial encounter: Secondary | ICD-10-CM | POA: Insufficient documentation

## 2012-04-11 DIAGNOSIS — I2589 Other forms of chronic ischemic heart disease: Secondary | ICD-10-CM | POA: Insufficient documentation

## 2012-04-11 DIAGNOSIS — R61 Generalized hyperhidrosis: Secondary | ICD-10-CM | POA: Diagnosis not present

## 2012-04-11 DIAGNOSIS — I10 Essential (primary) hypertension: Secondary | ICD-10-CM | POA: Diagnosis not present

## 2012-04-11 DIAGNOSIS — Z8546 Personal history of malignant neoplasm of prostate: Secondary | ICD-10-CM | POA: Insufficient documentation

## 2012-04-11 DIAGNOSIS — R6883 Chills (without fever): Secondary | ICD-10-CM | POA: Diagnosis not present

## 2012-04-11 DIAGNOSIS — S8010XA Contusion of unspecified lower leg, initial encounter: Secondary | ICD-10-CM

## 2012-04-11 LAB — DIFFERENTIAL
Lymphocytes Relative: 9 % — ABNORMAL LOW (ref 12–46)
Lymphs Abs: 1 10*3/uL (ref 0.7–4.0)
Monocytes Absolute: 1 10*3/uL (ref 0.1–1.0)
Monocytes Relative: 9 % (ref 3–12)
Neutro Abs: 9 10*3/uL — ABNORMAL HIGH (ref 1.7–7.7)

## 2012-04-11 LAB — CBC
HCT: 38.1 % — ABNORMAL LOW (ref 39.0–52.0)
Hemoglobin: 12.4 g/dL — ABNORMAL LOW (ref 13.0–17.0)
WBC: 11 10*3/uL — ABNORMAL HIGH (ref 4.0–10.5)

## 2012-04-11 LAB — COMPREHENSIVE METABOLIC PANEL
BUN: 19 mg/dL (ref 6–23)
CO2: 26 mEq/L (ref 19–32)
Chloride: 96 mEq/L (ref 96–112)
Creatinine, Ser: 0.9 mg/dL (ref 0.50–1.35)
GFR calc non Af Amer: 85 mL/min — ABNORMAL LOW (ref >90)
Total Bilirubin: 0.4 mg/dL (ref 0.3–1.2)

## 2012-04-11 MED ORDER — ONDANSETRON HCL 4 MG/2ML IJ SOLN
4.0000 mg | Freq: Once | INTRAMUSCULAR | Status: AC
  Administered 2012-04-11: 4 mg via INTRAVENOUS
  Filled 2012-04-11: qty 2

## 2012-04-11 MED ORDER — SODIUM CHLORIDE 0.9 % IV BOLUS (SEPSIS)
1000.0000 mL | Freq: Once | INTRAVENOUS | Status: AC
  Administered 2012-04-11: 1000 mL via INTRAVENOUS

## 2012-04-11 MED ORDER — HYDROCODONE-ACETAMINOPHEN 10-325 MG PO TABS: 1.0000 | ORAL_TABLET | Freq: Four times a day (QID) | ORAL | Status: DC | PRN

## 2012-04-11 MED ORDER — HYDROMORPHONE HCL PF 1 MG/ML IJ SOLN
1.0000 mg | Freq: Once | INTRAMUSCULAR | Status: AC
  Administered 2012-04-11: 1 mg via INTRAVENOUS
  Filled 2012-04-11: qty 1

## 2012-04-11 NOTE — ED Notes (Signed)
Pt states his left calf has been "tight and painful" x 3 weeks. Seen at ED in [place] and they "felt it was r/t his blood thinners". Followed up with PCP who sent him to [facility] and they told him it was a "bruise" Pt concerned he has a clot. Also c/o congestion.

## 2012-04-11 NOTE — ED Provider Notes (Signed)
History  This chart was scribed for Wayne Munch, MD by Shari Heritage. This patient was seen in room MH09/MH09 and the patient's care was started at 5:15PM.  CSN: [id number]  Arrival date & time 04/11/12  1622   First MD Initiated Contact with Patient 04/11/12 1642      Chief Complaint  Patient presents with  . Leg Pain     The history is provided by the patient. No language interpreter was used.     TAVORIS BRISK is a 71 y.o. male who presents to the Emergency Department complaining of moderate to severe pain and hardness in left leg behind knee and into the left calf onset 3 weeks ago associated with generalized discomfort throughout body and chills and diaphoresis at night. He denies injury as the cause of the pain. Patient reports seeing a physician at [facility] ED 3 weeks ago.. Patient was referred to [facility] 5 days ago by PCP with concern that there was a blood clot present.  Patient received an ultrasound and was told he had a hematoma in his left leg.  Patient was told that there was fluid leakage in veins in left leg but no blood clot upon ultrasound. Family reports that patient is usually active and ambulatory, but recent pain in leg has led to reduced mobility. He reports taking several pain medications with no improvment. He denies any prior episodes of similar pain. Patient also complains of chest congestion and nasal congestion.  No chest "pain", no pleuritic discomfort, exertional pain. Patient denies loss of sensation in toes, confusion, nausea, vomiting, fever. Patient with h/o of hypertension, coronary atherosclerosis, arthritis, ischemic cardiomyopathy, hyperlipidemia, GERD. Patient is a former smoked and denies alcohol use.  He is taking effient and aspirin daily.   Past Medical History  Diagnosis Date  . DM 06/20/2009  . HYPERLIPIDEMIA 06/20/2009  . HYPERTENSION 06/20/2009  . CORONARY ATHEROSCLEROSIS NATIVE CORONARY ARTERY 01/27/2011    s/p PCI 2/12  . Herpes zoster  ophthalmicus   . Arthritis   . Obesity   . GERD (gastroesophageal reflux disease)   . Ischemic cardiomyopathy 2/12    EF 31%  . Adenocarcinoma of prostate     s/p seed implants  . Polymyalgia rheumatica     Past Surgical History  Procedure Date  . Prostate surgery     cancer, seed implant    Family History  Problem Relation Age of Onset  . Cancer Mother 54    unknown CA  . Heart disease Father 98    MI, died  . Alcohol abuse Father     History  Substance Use Topics  . Smoking status: Former Smoker -- 0.3 packs/day for 20 years    Types: Cigarettes    Quit date: 02/23/1989  . Smokeless tobacco: Never Used  . Alcohol Use: No      Review of Systems Patient is positive for chills, diaphoresis, and left leg pain. Patient is negative for congestion, sore throat, headache, chest pain, abdominal pain, vomiting, fever, nausea, SOB, back pain, rash, dysuria and confusion.  Allergies  Atorvastatin  Home Medications   Current Outpatient Rx  Name Route Sig Dispense Refill  . ASPIRIN 81 MG PO TABS Oral Take 81 mg by mouth daily.      Marland Kitchen CARVEDILOL 12.5 MG PO TABS  Take one and one half tablets by mouth twice daily 90 tablet 6  . COENZYME Q10 200 MG PO TABS  Take one daily 90 tablet 3  . DIGOXIN  0.125 MG PO TABS  TAKE 1 TABLET EVERY DAY 30 tablet 6  . FUROSEMIDE 20 MG PO TABS Oral Take 1 tablet (20 mg total) by mouth as needed. 15 tablet 6  . GLIMEPIRIDE 4 MG PO TABS Oral Take 1 tablet (4 mg total) by mouth 2 (two) times daily. 60 tablet 6  . LISINOPRIL 10 MG PO TABS Oral Take 1 tablet (10 mg total) by mouth 2 (two) times daily. 60 tablet 6  . METFORMIN HCL 500 MG PO TABS Oral Take 1 tablet (500 mg total) by mouth 2 (two) times daily with a meal. 60 tablet 3  . CENTRUM SILVER ULTRA MENS PO Oral Take 1 tablet by mouth.      Marland Kitchen PRASUGREL HCL 10 MG PO TABS Oral Take 1 tablet (10 mg total) by mouth daily. 90 tablet 3  . PRAVASTATIN SODIUM 80 MG PO TABS Oral Take 1 tablet (80 mg  total) by mouth every evening. 30 tablet 6  . PREDNISONE 10 MG PO TABS  Use as directed. 30 tablet 0  . SPIRONOLACTONE 25 MG PO TABS Oral Take 1 tablet (25 mg total) by mouth daily. 30 tablet 3  . TRIAMCINOLONE ACETONIDE 0.5 % EX CREA Topical Apply topically 2 (two) times daily. 45 g 2  . HYDROCODONE-ACETAMINOPHEN 10-325 MG PO TABS Oral Take 1 tablet by mouth every 6 (six) hours as needed for pain. 15 tablet 0  . NITROGLYCERIN 0.4 MG SL SUBL Sublingual Place 0.4 mg under the tongue every 5 (five) minutes as needed.        Triage Vitals: BP 119/58  Pulse 92  Temp(Src) 98.1 F (36.7 C) (Oral)  Resp 20  Ht 5\' 10"  (1.778 m)  Wt 240 lb (108.863 kg)  BMI 34.44 kg/m2  SpO2 95%  Physical Exam  Nursing note and vitals reviewed. Constitutional: He is oriented to person, place, and time. He appears well-developed and well-nourished.  HENT:  Head: Normocephalic and atraumatic.  Eyes: Conjunctivae and EOM are normal.  Neck: Normal range of motion. Neck supple.  Cardiovascular: Normal rate, regular rhythm and normal heart sounds.   No murmur heard. Pulmonary/Chest: Effort normal and breath sounds normal.  Abdominal: Soft. Bowel sounds are normal. There is no tenderness. There is no rebound and no guarding.  Musculoskeletal: Normal range of motion. He exhibits tenderness. He exhibits no edema.       Patient has tenderness in back of left leg behind the knee and in the left calf.  Neurological: He is alert and oriented to person, place, and time. No cranial nerve deficit.  Skin: Skin is warm and dry.  Psychiatric: He has a normal mood and affect. His behavior is normal.    ED Course  Procedures (including critical care time)  DIAGNOSTIC STUDIES: Oxygen Saturation is 95% on room, adequate by my interpretation. Pulse is 92. sr normal BP is 119/58.    COORDINATION OF CARE: 5:15PM- Patient was informed of plan for care including blood work and pt agrees with course.  Labs Reviewed  CBC -  Abnormal; Notable for the following:    WBC 11.0 (*)    Hemoglobin 12.4 (*)    HCT 38.1 (*)    All other components within normal limits  DIFFERENTIAL - Abnormal; Notable for the following:    Neutrophils Relative 82 (*)    Neutro Abs 9.0 (*)    Lymphocytes Relative 9 (*)    All other components within normal limits  COMPREHENSIVE METABOLIC PANEL - Abnormal;  Notable for the following:    Sodium 133 (*)    Glucose, Bld 430 (*)    Albumin 3.0 (*)    GFR calc non Af Amer 85 (*)    All other components within normal limits   Dg Chest 2 View  04/11/2012  *RADIOLOGY REPORT*  Clinical Data: Chest and nose congestion with body chills.  Night sweats.  CHEST - 2 VIEW  Comparison: 04/06/2012 and 01/17/2011 radiographs.  Findings: The heart size and mediastinal contours are stable. Coronary artery stents are noted.  There is stable biapical pleural calcification.  There is stable bibasilar scarring.  There is an embolized prostate brachytherapy seed within the left lower lobe which appears unchanged.  No airspace disease or pleural effusion is identified.  There are no acute osseous findings.  IMPRESSION: Stable examination.  No acute cardiopulmonary process.  Original Report Authenticated By: Gerrianne Scale, M.D.     1. Hematoma of leg   2. URI (upper respiratory infection)      I reviewed the patient's prior chart visits as well as imaging. MDM  I personally performed the services described in this documentation, which was scribed in my presence. The recorded information has been reviewed and considered.  This M p.w ongoing concern over L leg pain/edema.  Notably, both the patient and his wife stated that his condition has generally improved (visually) but that with the persistency of his pain they are concerned.  Given the denial of pleuritic pain, hypoxia, tachypnea, the negative [place] (within the past week) there is low suspicion for dvt.  Hematoma is c/w with the patient's lesion.  His  description of congestion is most c/w uri.  He was d/c in stable condition w instructions on lesion-care and analgesics, w pmd f/u.  Wayne Munch, MD 04/11/12 2342

## 2012-04-13 ENCOUNTER — Encounter: Payer: Self-pay | Admitting: Family Medicine

## 2012-04-13 ENCOUNTER — Telehealth: Payer: Self-pay | Admitting: Family Medicine

## 2012-04-13 ENCOUNTER — Ambulatory Visit (INDEPENDENT_AMBULATORY_CARE_PROVIDER_SITE_OTHER): Payer: Medicare Other | Admitting: Family Medicine

## 2012-04-13 VITALS — BP 142/72 | Temp 99.7°F

## 2012-04-13 DIAGNOSIS — L0291 Cutaneous abscess, unspecified: Secondary | ICD-10-CM

## 2012-04-13 DIAGNOSIS — L0390 Cellulitis, unspecified: Secondary | ICD-10-CM | POA: Diagnosis not present

## 2012-04-13 DIAGNOSIS — R300 Dysuria: Secondary | ICD-10-CM

## 2012-04-13 LAB — CBC WITH DIFFERENTIAL/PLATELET
Basophils Absolute: 0 10*3/uL (ref 0.0–0.1)
Eosinophils Absolute: 0.1 10*3/uL (ref 0.0–0.7)
Eosinophils Relative: 0.3 % (ref 0.0–5.0)
MCV: 88.3 fl (ref 78.0–100.0)
Monocytes Absolute: 1.4 10*3/uL — ABNORMAL HIGH (ref 0.1–1.0)
Neutrophils Relative %: 81.9 % — ABNORMAL HIGH (ref 43.0–77.0)
Platelets: 284 10*3/uL (ref 150.0–400.0)
RDW: 13.4 % (ref 11.5–14.6)
WBC: 16.2 10*3/uL — ABNORMAL HIGH (ref 4.5–10.5)

## 2012-04-13 LAB — POCT URINALYSIS DIPSTICK
Leukocytes, UA: NEGATIVE
Spec Grav, UA: 1.02
Urobilinogen, UA: 0.2
pH, UA: 5

## 2012-04-13 MED ORDER — CEPHALEXIN 500 MG PO CAPS: 500.0000 mg | ORAL_CAPSULE | Freq: Three times a day (TID) | ORAL | Status: DC

## 2012-04-13 MED ORDER — CEFTRIAXONE SODIUM 1 G IJ SOLR: 1.0000 g | INTRAMUSCULAR | Status: DC

## 2012-04-13 NOTE — Telephone Encounter (Signed)
Pt has been having issues with his leg, pt was told a vein was bleeding through his leg. Pt came in here and saw Dr. Clent Ridges and he felt it could be a blood clot, pt had an ultra sound Sunday and they did not find a blood clot. Pt leg is very swollen and pt has a temp of 101.9 and is sleeping a lot. Pt would like to know what he should do they have been here for the situation and to the ER several times. Pt requesting to be contacted ASAP

## 2012-04-13 NOTE — Telephone Encounter (Signed)
Per Dr Caryl Never, pt needs to be seen today, Wayne [name] wiil call pt to schedule

## 2012-04-13 NOTE — Progress Notes (Signed)
Subjective:    Patient ID: Wayne [name], male    DOB: 08/07/1941, 71 y.o.   MRN: [id number]  HPI  Progressive left lower extremity pain. 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 [place] had reported venous Doppler negative with question hematoma. Patient had persistent pain and went to emergency department here 04-06-12 and repeat Doppler revealed again hematoma proximal medial calf. No evidence for DVT. Patient had persistent pain and swelling and return to emergency Department 04/11/2012 and was reassured regarding hematoma. Was given pain medication and told to elevate.  Patient now presents with fever 101 this morning and some chills. He's had some occasional urine frequency and burning over the past couple days and cough occasionally productive of yellow sputum. According to wife, he has had some redness and warmth of the left leg for several days. Today was the first day had recorded fever. He's had slight nausea and decreased appetite past couple days but no vomiting. No abdominal pain. Pain is intense with dependency and improved with elevation  Past Medical History  Diagnosis Date  . DM 06/20/2009  . HYPERLIPIDEMIA 06/20/2009  . HYPERTENSION 06/20/2009  . CORONARY ATHEROSCLEROSIS NATIVE CORONARY ARTERY 01/27/2011    s/p PCI 2/12  . Herpes zoster ophthalmicus   . Arthritis   . Obesity   . GERD (gastroesophageal reflux disease)   . Ischemic cardiomyopathy 2/12    EF 31%  . Adenocarcinoma of prostate     s/p seed implants  . Polymyalgia rheumatica    Past Surgical History  Procedure Date  . Prostate surgery     cancer, seed implant    reports that he quit smoking about 23 years ago. His smoking use included Cigarettes. He has a 6 pack-year smoking history. He has never used smokeless tobacco. He reports that he does not drink alcohol or use illicit drugs. family history  includes Alcohol abuse in his father; Cancer (age of onset:80) in his mother; and Heart disease (age of onset:61) in his father. Allergies  Allergen Reactions  . Atorvastatin     REACTION: sore legs      Review of Systems  Constitutional: Positive for fever, chills and appetite change.  Respiratory: Positive for cough.   Cardiovascular: Negative for chest pain.  Genitourinary: Positive for dysuria.  Musculoskeletal: Positive for gait problem.       Objective:   Physical Exam  Constitutional: He appears well-developed and well-nourished.  HENT:  Mouth/Throat: Oropharynx is clear and moist.  Neck: Neck supple.  Cardiovascular: Normal rate and regular rhythm.   Pulmonary/Chest: Effort normal and breath sounds normal. He has no wheezes.       Few faint crackles left base which seemed to improve with deep breathing  Musculoskeletal: He exhibits edema.       Left calf reveals diffuse edema. Increased warmth and redness mostly involving the posterior aspect of the calf extending up toward the knee region. No evidence for a knee effusion. Moderately tender to palpation. Left foot reveals good distal pulses with good capillary perfusion.          Assessment & Plan:  #1 Edema left leg.  Evidence clinically for DVT at this time. Reportedly hematoma by 2 prior ultrasounds. No evidence for DVT. Rocephin 1 g IM given. Start Keflex 500 mg 3 times a day and frequent elevation. Called immediately for any vomiting worsening pain or  other changes symptoms. We'll reassess tomorrow #2 dysuria. Check urinalysis.

## 2012-04-13 NOTE — Patient Instructions (Signed)
Elevate leg frequently Drink plenty of fluids Follow up immediately for any vomiting, confusion, lethargy, or worsening symptoms Schedule follow up for tomorrow.

## 2012-04-14 ENCOUNTER — Encounter: Payer: Self-pay | Admitting: Family Medicine

## 2012-04-14 ENCOUNTER — Ambulatory Visit (INDEPENDENT_AMBULATORY_CARE_PROVIDER_SITE_OTHER): Payer: Medicare Other | Admitting: Family Medicine

## 2012-04-14 VITALS — BP 120/70 | Temp 98.2°F

## 2012-04-14 DIAGNOSIS — L03119 Cellulitis of unspecified part of limb: Secondary | ICD-10-CM

## 2012-04-14 DIAGNOSIS — L02419 Cutaneous abscess of limb, unspecified: Secondary | ICD-10-CM | POA: Diagnosis not present

## 2012-04-14 NOTE — Patient Instructions (Signed)
Add one half glimepiride 4 mg at night/supper dose.

## 2012-04-14 NOTE — Progress Notes (Signed)
Subjective:    Patient ID: Wayne [name], male    DOB: 07/04/1941, 71 y.o.   MRN: [id number]  HPI  Patient seen for followup of lower extremity edema and cellulitis changes. Refer to last note. He had hematoma of the calf muscle and has had 2 different Dopplers which have not shown any evidence for DVT is shown hematoma following injury. He subsequently developed fever and increased warmth in the leg and we started him yesterday on Keflex 500 mg 3 times a day and gave her Rocephin 1 g IM. He did have some improvement fever yesterday but still had low-grade fever this morning. Overall though left leg pain and left leg warmth. He still has pain with dependency. He has not had any nausea or vomiting. Drinking fluids well. White blood count yesterday 16,000. Urinalysis revealed no evidence for infection but he had evidence for dehydration.  Type 2 diabetes. Takes metformin and Amaryl. We had recently reduced his dose of Amaryl. With this infection, as expected, blood sugars have increased with several past couple days 200-300 range. This may explain some of his urine frequency.  His cough already seems somewhat improved since yesterday  Past Medical History  Diagnosis Date  . DM 06/20/2009  . HYPERLIPIDEMIA 06/20/2009  . HYPERTENSION 06/20/2009  . CORONARY ATHEROSCLEROSIS NATIVE CORONARY ARTERY 01/27/2011    s/p PCI 2/12  . Herpes zoster ophthalmicus   . Arthritis   . Obesity   . GERD (gastroesophageal reflux disease)   . Ischemic cardiomyopathy 2/12    EF 31%  . Adenocarcinoma of prostate     s/p seed implants  . Polymyalgia rheumatica    Past Surgical History  Procedure Date  . Prostate surgery     cancer, seed implant    reports that he quit smoking about 23 years ago. His smoking use included Cigarettes. He has a 6 pack-year smoking history. He has never used smokeless tobacco. He reports that he does not drink alcohol or use illicit drugs. family history includes Alcohol abuse in his  father; Cancer (age of onset:80) in his mother; and Heart disease (age of onset:61) in his father. Allergies  Allergen Reactions  . Atorvastatin     REACTION: sore legs      Review of Systems  Constitutional: Positive for fatigue. Negative for chills.  Respiratory: Positive for cough. Negative for shortness of breath.   Cardiovascular: Positive for leg swelling. Negative for chest pain.  Gastrointestinal: Negative for nausea and vomiting.  Genitourinary: Positive for frequency. Negative for dysuria.  Neurological: Negative for dizziness and syncope.       Objective:   Physical Exam  Constitutional: He appears well-developed and well-nourished.  HENT:  Mouth/Throat: Oropharynx is clear and moist.  Cardiovascular: Normal rate and regular rhythm.   Pulmonary/Chest: Effort normal and breath sounds normal. No respiratory distress. He has no wheezes. He has no rales.  Musculoskeletal:       Left leg reveals continued edema but certainly no worse than yesterday. Has less warmth to palpation still has some splotchy redness which is essentially unchanged. Less tender to palpation.          Assessment & Plan:  #1 cellulitis left leg. He is aware this may take several days to improve. He is having less systemic fever which is a good sign and less warmth to touch. Continue Keflex 500 mg 3 times a day. Frequent elevation. They're using heating pad on low heat. Reassess next week and sooner if he has  any recurrent fever or worsening symptoms #2 type 2 diabetes exacerbated by recent infection. Add back Amaryl 4 mg one half tablet for evening dose and continue 4 mg the morning.

## 2012-04-15 ENCOUNTER — Other Ambulatory Visit: Payer: Self-pay | Admitting: *Deleted

## 2012-04-15 DIAGNOSIS — C61 Malignant neoplasm of prostate: Secondary | ICD-10-CM | POA: Diagnosis not present

## 2012-04-15 DIAGNOSIS — R310 Gross hematuria: Secondary | ICD-10-CM | POA: Diagnosis not present

## 2012-04-15 DIAGNOSIS — N35919 Unspecified urethral stricture, male, unspecified site: Secondary | ICD-10-CM | POA: Insufficient documentation

## 2012-04-15 MED ORDER — HYDROCODONE-ACETAMINOPHEN 10-325 MG PO TABS: 1.0000 | ORAL_TABLET | Freq: Four times a day (QID) | ORAL | Status: DC | PRN

## 2012-04-15 NOTE — Telephone Encounter (Signed)
Refill OK

## 2012-04-15 NOTE — Telephone Encounter (Signed)
Rx refill request Hydrocodone 10-325, one tab every 6 hours prn pain.  Last filled on 4/28 #15 with 0 refills at the ER

## 2012-04-15 NOTE — Telephone Encounter (Signed)
Per Dr Caryl Never, #20 ok, rx called in, pt informed on home VM

## 2012-04-16 ENCOUNTER — Encounter (HOSPITAL_BASED_OUTPATIENT_CLINIC_OR_DEPARTMENT_OTHER): Payer: Self-pay | Admitting: *Deleted

## 2012-04-16 ENCOUNTER — Emergency Department (HOSPITAL_BASED_OUTPATIENT_CLINIC_OR_DEPARTMENT_OTHER)
Admission: EM | Admit: 2012-04-16 | Discharge: 2012-04-16 | Disposition: A | Discharge status: Home / Self Care | Payer: Medicare Other | Source: Home / Self Care | Attending: Emergency Medicine | Admitting: Emergency Medicine

## 2012-04-16 DIAGNOSIS — R34 Anuria and oliguria: Secondary | ICD-10-CM | POA: Diagnosis not present

## 2012-04-16 DIAGNOSIS — IMO0002 Reserved for concepts with insufficient information to code with codable children: Secondary | ICD-10-CM | POA: Diagnosis not present

## 2012-04-16 DIAGNOSIS — R300 Dysuria: Secondary | ICD-10-CM | POA: Diagnosis not present

## 2012-04-16 DIAGNOSIS — R0602 Shortness of breath: Secondary | ICD-10-CM | POA: Diagnosis not present

## 2012-04-16 DIAGNOSIS — Z79899 Other long term (current) drug therapy: Secondary | ICD-10-CM | POA: Insufficient documentation

## 2012-04-16 DIAGNOSIS — R319 Hematuria, unspecified: Secondary | ICD-10-CM | POA: Diagnosis not present

## 2012-04-16 DIAGNOSIS — R339 Retention of urine, unspecified: Secondary | ICD-10-CM | POA: Insufficient documentation

## 2012-04-16 DIAGNOSIS — M353 Polymyalgia rheumatica: Secondary | ICD-10-CM | POA: Insufficient documentation

## 2012-04-16 DIAGNOSIS — E785 Hyperlipidemia, unspecified: Secondary | ICD-10-CM | POA: Insufficient documentation

## 2012-04-16 DIAGNOSIS — T819XXA Unspecified complication of procedure, initial encounter: Secondary | ICD-10-CM | POA: Diagnosis not present

## 2012-04-16 DIAGNOSIS — N9989 Other postprocedural complications and disorders of genitourinary system: Secondary | ICD-10-CM | POA: Diagnosis not present

## 2012-04-16 DIAGNOSIS — R6889 Other general symptoms and signs: Secondary | ICD-10-CM | POA: Diagnosis not present

## 2012-04-16 DIAGNOSIS — E119 Type 2 diabetes mellitus without complications: Secondary | ICD-10-CM | POA: Insufficient documentation

## 2012-04-16 NOTE — ED Notes (Signed)
Dr. Molpus at bedside. 

## 2012-04-16 NOTE — ED Notes (Signed)
Per EMS: pt had a prostate procedure performed at [facility] yesterday and denied difficulty until four hours ago, when he became unable to urinate.

## 2012-04-16 NOTE — Discharge Instructions (Signed)
Acute Urinary Retention, Male You have been seen by a caregiver today because of your inability to urinate (pass your water). This is a common problem in elderly males. As men age their prostates become larger and block the flow of urine from the bladder. This is usually a problem that has come on gradually. It is often first noticed by having to get up at night to urinate. This is because as the prostate enlarges it is more difficult to empty the bladder completely. Treatment may involve a one time catheterization to empty the bladder. This is putting in a tube to drain your urine. Then you and your personal caregiver can decide at your earliest convenience how to handle this problem in the future. It may also be a problem that may not recur for years. Sometimes this problem can be caused by medications. In this case, all that is often necessary is to discontinue the offending agent. If you are to leave the foley catheter (a long, narrow, hollow tube) in and go home with a drainage system, you will need to discuss the best course of action with your caregiver. While the catheter is in, maintain a good intake of fluids. Keep the drainage bag emptied and lower than your catheter. This is so contaminated (infected) urine will not be flowing back into your bladder. This could lead to a urinary tract infection. Only take over-the-counter or prescription medicines for pain, discomfort, or fever as directed by your caregiver.  SEEK IMMEDIATE MEDICAL CARE IF:  You develop chills, fever, or show signs of generalized illness that occurs prior to seeing your caregiver. Document Released: 03/09/2001 Document Revised: 11/20/2011 Document Reviewed: 11/22/2008 [employer] Patient Information 2012 [employer]. 

## 2012-04-16 NOTE — ED Provider Notes (Signed)
History     CSN: [id number]  Arrival date & time 04/16/12  4098   First MD Initiated Contact with Patient 04/16/12 [phone]      Chief Complaint  Patient presents with  . Urinary Retention    (Consider location/radiation/quality/duration/timing/severity/associated sxs/prior treatment) HPI This is a 71 year old white male with a history of surgery for prostate cancer in 2009. He saw his urologist yesterday in [place] who performed a cystoscopy and dilated patient of his prostatic urethra. He had some postprocedural hematuria but was able to urinate freely until about 10 PM yesterday evening. At that time he was unable to urinate and he now presents with urinary retention. His bladder is distended and he is in severe discomfort.  He was also recently diagnosed with cellulitis of the left lower leg and is on antibiotic for this. He has had 2 Doppler ultrasounds of that leg which were negative for deep vein thrombosis.  Past Medical History  Diagnosis Date  . DM 06/20/2009  . HYPERLIPIDEMIA 06/20/2009  . HYPERTENSION 06/20/2009  . CORONARY ATHEROSCLEROSIS NATIVE CORONARY ARTERY 01/27/2011    s/p PCI 2/12  . Herpes zoster ophthalmicus   . Arthritis   . Obesity   . GERD (gastroesophageal reflux disease)   . Ischemic cardiomyopathy 2/12    EF 31%  . Adenocarcinoma of prostate     s/p seed implants  . Polymyalgia rheumatica     Past Surgical History  Procedure Date  . Prostate surgery     cancer, seed implant    Family History  Problem Relation Age of Onset  . Cancer Mother 35    unknown CA  . Heart disease Father 65    MI, died  . Alcohol abuse Father     History  Substance Use Topics  . Smoking status: Former Smoker -- 0.3 packs/day for 20 years    Types: Cigarettes    Quit date: 02/23/1989  . Smokeless tobacco: Never Used  . Alcohol Use: No      Review of Systems  All other systems reviewed and are negative.    Allergies  Atorvastatin  Home Medications    Current Outpatient Rx  Name Route Sig Dispense Refill  . ASPIRIN 81 MG PO TABS Oral Take 81 mg by mouth daily.      Marland Kitchen CARVEDILOL 12.5 MG PO TABS  Take one and one half tablets by mouth twice daily 90 tablet 6  . CEPHALEXIN 500 MG PO CAPS Oral Take 1 capsule (500 mg total) by mouth 3 (three) times daily. 30 capsule 0  . COENZYME Q10 200 MG PO TABS  Take one daily 90 tablet 3  . DIGOXIN 0.125 MG PO TABS  TAKE 1 TABLET EVERY DAY 30 tablet 6  . FUROSEMIDE 20 MG PO TABS Oral Take 1 tablet (20 mg total) by mouth as needed. 15 tablet 6  . GLIMEPIRIDE 4 MG PO TABS Oral Take 1 tablet (4 mg total) by mouth 2 (two) times daily. 60 tablet 6  . HYDROCODONE-ACETAMINOPHEN 10-325 MG PO TABS Oral Take 1 tablet by mouth every 6 (six) hours as needed for pain. 20 tablet 0  . LISINOPRIL 10 MG PO TABS Oral Take 1 tablet (10 mg total) by mouth 2 (two) times daily. 60 tablet 6  . METFORMIN HCL 500 MG PO TABS Oral Take 1 tablet (500 mg total) by mouth 2 (two) times daily with a meal. 60 tablet 3  . CENTRUM SILVER ULTRA MENS PO Oral Take 1 tablet by  mouth.      Marland Kitchen NITROGLYCERIN 0.4 MG SL SUBL Sublingual Place 0.4 mg under the tongue every 5 (five) minutes as needed.      Marland Kitchen PRASUGREL HCL 10 MG PO TABS Oral Take 1 tablet (10 mg total) by mouth daily. 90 tablet 3  . PRAVASTATIN SODIUM 80 MG PO TABS Oral Take 1 tablet (80 mg total) by mouth every evening. 30 tablet 6  . PREDNISONE 10 MG PO TABS  5 mg. Pt taking a 1 mg tab also to equal 6 mg for one month, decreasing by 1 mg every month    . SPIRONOLACTONE 25 MG PO TABS Oral Take 1 tablet (25 mg total) by mouth daily. 30 tablet 3  . TRIAMCINOLONE ACETONIDE 0.5 % EX CREA Topical Apply topically 2 (two) times daily. 45 g 2    BP 173/75  Pulse 113  Temp(Src) 98.6 F (37 C) (Oral)  Resp 20  SpO2 98%  Physical Exam General: Well-developed, well-nourished male in no acute distress but appears uncomfortable; appearance consistent with age of record HENT: normocephalic,  atraumatic Eyes: pupils equal round and reactive to light; extraocular muscles intact Neck: supple Heart: regular rate and rhythm Lungs: Normal respiratory effort and excursion Abdomen: soft; nondistended; distended, tender bladder; bowel sounds present GU: Tanner 4 male Extremities: No deformity; full range of motion; edema and resolving erythema of left lower leg Neurologic: Awake, alert and oriented; motor function intact in all extremities and symmetric; no facial droop Skin: Warm and dry Psychiatric: Anxious    ED Course  Procedures (including critical care time)     MDM  6:49 AM 18 French Foley catheter placed by nursing staff with my assistance. Approximately 500 mL of straw-colored urine obtained. Patient tolerated the procedure well and had significant relief of symptoms once his bladder was emptied. He will have a leg bag placed and he will follow up with Dr. Earlene Plater, his urologist, later today.          Hanley Seamen, MD 04/16/12 [phone]

## 2012-04-16 NOTE — ED Notes (Signed)
Pt became lightheaded with moving from lying to sitting position. Pt assisted back to lying position. Next shift RN notified to allow patient to rest for a little longer, then reassess. BP have improved. Skin color "back to normal" per wife.

## 2012-04-17 ENCOUNTER — Inpatient Hospital Stay (HOSPITAL_COMMUNITY)
Admission: EM | Admit: 2012-04-17 | Discharge: 2012-04-17 | DRG: 696 | Disposition: A | Discharge status: Other Acute Inpatient Hospital | Payer: Medicare Other | Attending: Internal Medicine | Admitting: Internal Medicine

## 2012-04-17 ENCOUNTER — Encounter (HOSPITAL_COMMUNITY): Payer: Self-pay | Admitting: *Deleted

## 2012-04-17 DIAGNOSIS — L259 Unspecified contact dermatitis, unspecified cause: Secondary | ICD-10-CM

## 2012-04-17 DIAGNOSIS — I509 Heart failure, unspecified: Secondary | ICD-10-CM | POA: Diagnosis not present

## 2012-04-17 DIAGNOSIS — D72829 Elevated white blood cell count, unspecified: Secondary | ICD-10-CM | POA: Diagnosis present

## 2012-04-17 DIAGNOSIS — R338 Other retention of urine: Secondary | ICD-10-CM | POA: Diagnosis not present

## 2012-04-17 DIAGNOSIS — E119 Type 2 diabetes mellitus without complications: Secondary | ICD-10-CM

## 2012-04-17 DIAGNOSIS — I2510 Atherosclerotic heart disease of native coronary artery without angina pectoris: Secondary | ICD-10-CM

## 2012-04-17 DIAGNOSIS — E785 Hyperlipidemia, unspecified: Secondary | ICD-10-CM | POA: Diagnosis present

## 2012-04-17 DIAGNOSIS — M353 Polymyalgia rheumatica: Secondary | ICD-10-CM | POA: Diagnosis present

## 2012-04-17 DIAGNOSIS — N35919 Unspecified urethral stricture, male, unspecified site: Secondary | ICD-10-CM | POA: Diagnosis not present

## 2012-04-17 DIAGNOSIS — B0239 Other herpes zoster eye disease: Secondary | ICD-10-CM

## 2012-04-17 DIAGNOSIS — M76899 Other specified enthesopathies of unspecified lower limb, excluding foot: Secondary | ICD-10-CM

## 2012-04-17 DIAGNOSIS — Z9861 Coronary angioplasty status: Secondary | ICD-10-CM | POA: Diagnosis not present

## 2012-04-17 DIAGNOSIS — R319 Hematuria, unspecified: Secondary | ICD-10-CM | POA: Diagnosis not present

## 2012-04-17 DIAGNOSIS — Z7982 Long term (current) use of aspirin: Secondary | ICD-10-CM

## 2012-04-17 DIAGNOSIS — R079 Chest pain, unspecified: Secondary | ICD-10-CM

## 2012-04-17 DIAGNOSIS — R0602 Shortness of breath: Secondary | ICD-10-CM

## 2012-04-17 DIAGNOSIS — N9989 Other postprocedural complications and disorders of genitourinary system: Secondary | ICD-10-CM | POA: Diagnosis not present

## 2012-04-17 DIAGNOSIS — R3915 Urgency of urination: Secondary | ICD-10-CM | POA: Diagnosis present

## 2012-04-17 DIAGNOSIS — N489 Disorder of penis, unspecified: Secondary | ICD-10-CM | POA: Diagnosis not present

## 2012-04-17 DIAGNOSIS — J209 Acute bronchitis, unspecified: Secondary | ICD-10-CM

## 2012-04-17 DIAGNOSIS — I2589 Other forms of chronic ischemic heart disease: Secondary | ICD-10-CM | POA: Diagnosis present

## 2012-04-17 DIAGNOSIS — K219 Gastro-esophageal reflux disease without esophagitis: Secondary | ICD-10-CM | POA: Diagnosis present

## 2012-04-17 DIAGNOSIS — N401 Enlarged prostate with lower urinary tract symptoms: Secondary | ICD-10-CM

## 2012-04-17 DIAGNOSIS — R339 Retention of urine, unspecified: Principal | ICD-10-CM | POA: Diagnosis present

## 2012-04-17 DIAGNOSIS — Z79899 Other long term (current) drug therapy: Secondary | ICD-10-CM | POA: Diagnosis not present

## 2012-04-17 DIAGNOSIS — I10 Essential (primary) hypertension: Secondary | ICD-10-CM | POA: Diagnosis not present

## 2012-04-17 DIAGNOSIS — N179 Acute kidney failure, unspecified: Secondary | ICD-10-CM | POA: Diagnosis not present

## 2012-04-17 DIAGNOSIS — R109 Unspecified abdominal pain: Secondary | ICD-10-CM

## 2012-04-17 DIAGNOSIS — N138 Other obstructive and reflux uropathy: Secondary | ICD-10-CM

## 2012-04-17 DIAGNOSIS — Z87891 Personal history of nicotine dependence: Secondary | ICD-10-CM | POA: Diagnosis not present

## 2012-04-17 DIAGNOSIS — E669 Obesity, unspecified: Secondary | ICD-10-CM | POA: Diagnosis present

## 2012-04-17 DIAGNOSIS — Z6834 Body mass index (BMI) 34.0-34.9, adult: Secondary | ICD-10-CM | POA: Diagnosis not present

## 2012-04-17 DIAGNOSIS — M129 Arthropathy, unspecified: Secondary | ICD-10-CM | POA: Diagnosis present

## 2012-04-17 DIAGNOSIS — B029 Zoster without complications: Secondary | ICD-10-CM | POA: Diagnosis present

## 2012-04-17 DIAGNOSIS — K921 Melena: Secondary | ICD-10-CM

## 2012-04-17 DIAGNOSIS — G4733 Obstructive sleep apnea (adult) (pediatric): Secondary | ICD-10-CM

## 2012-04-17 DIAGNOSIS — R310 Gross hematuria: Secondary | ICD-10-CM | POA: Diagnosis not present

## 2012-04-17 DIAGNOSIS — L03119 Cellulitis of unspecified part of limb: Secondary | ICD-10-CM | POA: Diagnosis present

## 2012-04-17 DIAGNOSIS — IMO0002 Reserved for concepts with insufficient information to code with codable children: Secondary | ICD-10-CM

## 2012-04-17 DIAGNOSIS — L0390 Cellulitis, unspecified: Secondary | ICD-10-CM

## 2012-04-17 DIAGNOSIS — K922 Gastrointestinal hemorrhage, unspecified: Secondary | ICD-10-CM | POA: Diagnosis not present

## 2012-04-17 DIAGNOSIS — Z8546 Personal history of malignant neoplasm of prostate: Secondary | ICD-10-CM | POA: Diagnosis not present

## 2012-04-17 DIAGNOSIS — L02419 Cutaneous abscess of limb, unspecified: Secondary | ICD-10-CM | POA: Diagnosis not present

## 2012-04-17 DIAGNOSIS — R300 Dysuria: Secondary | ICD-10-CM | POA: Diagnosis present

## 2012-04-17 DIAGNOSIS — I5022 Chronic systolic (congestive) heart failure: Secondary | ICD-10-CM

## 2012-04-17 DIAGNOSIS — I428 Other cardiomyopathies: Secondary | ICD-10-CM

## 2012-04-17 DIAGNOSIS — Z888 Allergy status to other drugs, medicaments and biological substances status: Secondary | ICD-10-CM | POA: Diagnosis not present

## 2012-04-17 DIAGNOSIS — N320 Bladder-neck obstruction: Secondary | ICD-10-CM | POA: Diagnosis not present

## 2012-04-17 DIAGNOSIS — C61 Malignant neoplasm of prostate: Secondary | ICD-10-CM

## 2012-04-17 DIAGNOSIS — N529 Male erectile dysfunction, unspecified: Secondary | ICD-10-CM | POA: Diagnosis present

## 2012-04-17 LAB — DIFFERENTIAL
Basophils Absolute: 0 10*3/uL (ref 0.0–0.1)
Lymphocytes Relative: 8 % — ABNORMAL LOW (ref 12–46)
Lymphs Abs: 1.1 10*3/uL (ref 0.7–4.0)
Neutro Abs: 11.3 10*3/uL — ABNORMAL HIGH (ref 1.7–7.7)
Neutrophils Relative %: 83 % — ABNORMAL HIGH (ref 43–77)

## 2012-04-17 LAB — GLUCOSE, CAPILLARY
Glucose-Capillary: 368 mg/dL — ABNORMAL HIGH (ref 70–99)
Glucose-Capillary: 347 mg/dL — ABNORMAL HIGH (ref 70–99)

## 2012-04-17 LAB — BASIC METABOLIC PANEL
CO2: 25 mEq/L (ref 19–32)
Chloride: 94 mEq/L — ABNORMAL LOW (ref 96–112)
Glucose, Bld: 381 mg/dL — ABNORMAL HIGH (ref 70–99)
Potassium: 4.4 mEq/L (ref 3.5–5.1)
Sodium: 132 mEq/L — ABNORMAL LOW (ref 135–145)

## 2012-04-17 LAB — URINE MICROSCOPIC-ADD ON

## 2012-04-17 LAB — PROTIME-INR
INR: 1.17 (ref 0.00–1.49)
Prothrombin Time: 15.1 seconds (ref 11.6–15.2)

## 2012-04-17 LAB — CBC
MCV: 87.2 fL (ref 78.0–100.0)
Platelets: 300 10*3/uL (ref 150–400)
RBC: 3.91 MIL/uL — ABNORMAL LOW (ref 4.22–5.81)
RDW: 13.3 % (ref 11.5–15.5)
WBC: 13.7 10*3/uL — ABNORMAL HIGH (ref 4.0–10.5)

## 2012-04-17 LAB — URINALYSIS, ROUTINE W REFLEX MICROSCOPIC
Nitrite: NEGATIVE
Specific Gravity, Urine: 1.043 — ABNORMAL HIGH (ref 1.005–1.030)
Urobilinogen, UA: 1 mg/dL (ref 0.0–1.0)
pH: 6 (ref 5.0–8.0)

## 2012-04-17 LAB — HEMOGLOBIN A1C: Mean Plasma Glucose: 280 mg/dL — ABNORMAL HIGH (ref <117)

## 2012-04-17 MED ORDER — INSULIN GLARGINE 100 UNIT/ML ~~LOC~~ SOLN
10.0000 [IU] | Freq: Once | SUBCUTANEOUS | Status: AC
  Administered 2012-04-17: 10 [IU] via SUBCUTANEOUS

## 2012-04-17 MED ORDER — ASPIRIN EC 81 MG PO TBEC
81.0000 mg | DELAYED_RELEASE_TABLET | Freq: Every day | ORAL | Status: DC
  Administered 2012-04-17: 81 mg via ORAL
  Filled 2012-04-17: qty 1

## 2012-04-17 MED ORDER — SODIUM CHLORIDE 0.9 % IJ SOLN: 3.0000 mL | Freq: Two times a day (BID) | INTRAMUSCULAR | Status: DC

## 2012-04-17 MED ORDER — DIGOXIN 125 MCG PO TABS
125.0000 ug | ORAL_TABLET | Freq: Every day | ORAL | Status: DC
  Administered 2012-04-17: 125 ug via ORAL
  Filled 2012-04-17: qty 1

## 2012-04-17 MED ORDER — INSULIN REGULAR HUMAN 100 UNIT/ML IJ SOLN: 6.0000 [IU] | Freq: Once | INTRAMUSCULAR | Status: DC

## 2012-04-17 MED ORDER — PRASUGREL HCL 10 MG PO TABS
10.0000 mg | ORAL_TABLET | Freq: Every day | ORAL | Status: DC
  Administered 2012-04-17: 10 mg via ORAL
  Filled 2012-04-17: qty 1

## 2012-04-17 MED ORDER — INSULIN ASPART 100 UNIT/ML ~~LOC~~ SOLN
0.0000 [IU] | SUBCUTANEOUS | Status: DC
  Administered 2012-04-17: 7 [IU] via SUBCUTANEOUS

## 2012-04-17 MED ORDER — ASPIRIN 81 MG PO TABS: 81.0000 mg | ORAL_TABLET | Freq: Every day | ORAL | Status: DC

## 2012-04-17 MED ORDER — VANCOMYCIN HCL IN DEXTROSE 1-5 GM/200ML-% IV SOLN
1000.0000 mg | Freq: Once | INTRAVENOUS | Status: AC
  Administered 2012-04-17: 1000 mg via INTRAVENOUS
  Filled 2012-04-17: qty 200

## 2012-04-17 MED ORDER — PREDNISONE 5 MG PO TABS
5.0000 mg | ORAL_TABLET | Freq: Every day | ORAL | Status: DC
  Administered 2012-04-17: 5 mg via ORAL
  Filled 2012-04-17: qty 1

## 2012-04-17 MED ORDER — ONDANSETRON HCL 4 MG PO TABS: 4.0000 mg | ORAL_TABLET | Freq: Four times a day (QID) | ORAL | Status: DC | PRN

## 2012-04-17 MED ORDER — METFORMIN HCL 500 MG PO TABS
500.0000 mg | ORAL_TABLET | Freq: Two times a day (BID) | ORAL | Status: DC
  Administered 2012-04-17: 500 mg via ORAL
  Filled 2012-04-17 (×2): qty 1

## 2012-04-17 MED ORDER — LISINOPRIL 10 MG PO TABS
10.0000 mg | ORAL_TABLET | Freq: Two times a day (BID) | ORAL | Status: DC
  Filled 2012-04-17: qty 1

## 2012-04-17 MED ORDER — VANCOMYCIN HCL 1000 MG IV SOLR
2000.0000 mg | Freq: Once | INTRAVENOUS | Status: DC
  Filled 2012-04-17: qty 2000

## 2012-04-17 MED ORDER — SIMVASTATIN 40 MG PO TABS
40.0000 mg | ORAL_TABLET | Freq: Every day | ORAL | Status: DC
  Administered 2012-04-17: 40 mg via ORAL
  Filled 2012-04-17: qty 1

## 2012-04-17 MED ORDER — PREDNISONE 1 MG PO TABS
1.0000 mg | ORAL_TABLET | Freq: Every day | ORAL | Status: DC
  Administered 2012-04-17: 1 mg via ORAL
  Filled 2012-04-17: qty 1

## 2012-04-17 MED ORDER — VANCOMYCIN HCL IN DEXTROSE 1-5 GM/200ML-% IV SOLN
1000.0000 mg | Freq: Two times a day (BID) | INTRAVENOUS | Status: DC
  Filled 2012-04-17: qty 200

## 2012-04-17 MED ORDER — VANCOMYCIN HCL IN DEXTROSE 1-5 GM/200ML-% IV SOLN: 1000.0000 mg | Freq: Two times a day (BID) | INTRAVENOUS | Status: DC

## 2012-04-17 MED ORDER — SODIUM CHLORIDE 0.9 % IJ SOLN: 3.0000 mL | INTRAMUSCULAR | Status: DC | PRN

## 2012-04-17 MED ORDER — CARVEDILOL 12.5 MG PO TABS
18.7500 mg | ORAL_TABLET | Freq: Two times a day (BID) | ORAL | Status: DC
  Administered 2012-04-17: 18.75 mg via ORAL
  Filled 2012-04-17 (×2): qty 1

## 2012-04-17 MED ORDER — ENOXAPARIN SODIUM 30 MG/0.3ML ~~LOC~~ SOLN
30.0000 mg | SUBCUTANEOUS | Status: DC
  Filled 2012-04-17: qty 0.3

## 2012-04-17 MED ORDER — SODIUM CHLORIDE 0.9 % IV SOLN: 250.0000 mL | INTRAVENOUS | Status: DC | PRN

## 2012-04-17 MED ORDER — SPIRONOLACTONE 25 MG PO TABS
25.0000 mg | ORAL_TABLET | Freq: Every day | ORAL | Status: DC
  Administered 2012-04-17: 25 mg via ORAL
  Filled 2012-04-17: qty 1

## 2012-04-17 MED ORDER — SODIUM CHLORIDE 0.9 % IV BOLUS (SEPSIS)
500.0000 mL | Freq: Once | INTRAVENOUS | Status: AC
  Administered 2012-04-17: 500 mL via INTRAVENOUS

## 2012-04-17 MED ORDER — ONDANSETRON HCL 4 MG/2ML IJ SOLN: 4.0000 mg | Freq: Four times a day (QID) | INTRAMUSCULAR | Status: DC | PRN

## 2012-04-17 MED ORDER — HYDROCODONE-ACETAMINOPHEN 10-325 MG PO TABS
1.0000 | ORAL_TABLET | Freq: Four times a day (QID) | ORAL | Status: DC | PRN
  Administered 2012-04-17: 1 via ORAL
  Filled 2012-04-17: qty 1

## 2012-04-17 MED ORDER — INSULIN ASPART 100 UNIT/ML ~~LOC~~ SOLN
6.0000 [IU] | Freq: Once | SUBCUTANEOUS | Status: AC
  Administered 2012-04-17: 6 [IU] via SUBCUTANEOUS
  Filled 2012-04-17: qty 1

## 2012-04-17 NOTE — ED Notes (Signed)
WUJ:[id number]<JY> Expected date:04/17/12<BR> Expected time:11:17 AM<BR> Means of arrival:Ambulance<BR> Comments:<BR> Blood in urine

## 2012-04-17 NOTE — Progress Notes (Signed)
ANTIBIOTIC CONSULT NOTE - INITIAL  Pharmacy Consult for Vancomycin Indication: Lower leg cellulitis  Allergies  Allergen Reactions  . Atorvastatin     REACTION: sore legs    Patient Measurements: Wt: 109kg as of 04/11/12 Ht: 70" as of 04/11/12 IBW: 73kg Adjusted Body Weight: 87.4kg  Vital Signs: Temp: 98.4 F (36.9 C) (05/04 1118) Temp src: Oral (05/04 1118) BP: 133/62 mmHg (05/04 1118) Pulse Rate: 71  (05/04 1257) Intake/Output from previous day:   Intake/Output from this shift:    Labs:  Basename 04/17/12 1150  WBC 13.7*  HGB 11.0*  PLT 300  LABCREA --  CREATININE 0.82   CrCl 87 ml/min (normalized)  Microbiology: No results found for this or any previous visit (from the past 720 hour(s)).  Medical History: Past Medical History  Diagnosis Date  . DM 06/20/2009  . HYPERLIPIDEMIA 06/20/2009  . HYPERTENSION 06/20/2009  . CORONARY ATHEROSCLEROSIS NATIVE CORONARY ARTERY 01/27/2011    s/p PCI 2/12  . Herpes zoster ophthalmicus   . Arthritis   . Obesity   . GERD (gastroesophageal reflux disease)   . Ischemic cardiomyopathy 2/12    EF 31%  . Adenocarcinoma of prostate     s/p seed implants  . Polymyalgia rheumatica     Medications:  Scheduled:    . aspirin EC  81 mg Oral Daily  . carvedilol  18.75 mg Oral BID WC  . cefTRIAXone (ROCEPHIN) IM  1 g Intramuscular Q24H  . digoxin  125 mcg Oral Daily  . enoxaparin  30 mg Subcutaneous Q24H  . insulin aspart  0-9 Units Subcutaneous Q4H  . insulin aspart  6 Units Subcutaneous Once  . lisinopril  10 mg Oral BID  . metFORMIN  500 mg Oral BID WC  . prasugrel  10 mg Oral Daily  . predniSONE  1 mg Oral Daily  . predniSONE  5 mg Oral Daily  . simvastatin  40 mg Oral q1800  . sodium chloride  500 mL Intravenous Once  . sodium chloride  3 mL Intravenous Q12H  . spironolactone  25 mg Oral Daily  . vancomycin  1,000 mg Intravenous Once  . DISCONTD: aspirin  81 mg Oral Daily  . DISCONTD: insulin regular  6 Units  Subcutaneous Once   Assessment:  13 YOM with prostate cancer, s/p cystoscopy with dilation of the prostatic urethra 04/15/12 who presents to the ER with hematuria.   Patient incidentally has also been having persistent left lower extremity pain. Doppler studies that did not show evidence of deep venous thrombosis but did show signs of a hematoma.  Patient was started on Keflex by his primary care doctor >48hr ago for this. He still continues to have discomfort in his left leg.  Starting vancomycin, will use obese dosing protocol since weight > 100kg   Goal of Therapy:  Vancomycin trough level 10-15 mcg/ml  Plan:   Vancomycin 2gm IV x 1, then 1gm IV q12h  Check trough at steady state  Consider adding gram negative coverage  Loralee Pacas, PharmD, BCPS Pager: [phone] 04/17/2012,1:59 PM

## 2012-04-17 NOTE — Discharge Summary (Signed)
Patient ID: Wayne [name] MRN: [id number] DOB/AGE: [birth year] 71 y.o.  Admit date: 04/17/2012 Discharge date: 04/17/2012  Primary Care Physician:  Kristian Covey, MD, MD  Discharge Diagnoses:  Urinary retention, s/p dilation procedure    Medication List  As of 04/17/2012  7:26 PM   STOP taking these medications         lisinopril 10 MG tablet         TAKE these medications         aspirin 81 MG tablet   Take 81 mg by mouth daily.      carvedilol 12.5 MG tablet   Commonly known as: COREG   Take 18.75 mg by mouth 2 (two) times daily with a meal.      cephALEXin 500 MG capsule   Commonly known as: KEFLEX   Take 1 capsule (500 mg total) by mouth 3 (three) times daily.      CoQ-10 200 MG Caps   Take 1 capsule by mouth daily.      digoxin 0.125 MG tablet   Commonly known as: LANOXIN   Take 125 mcg by mouth daily.      furosemide 20 MG tablet   Commonly known as: LASIX   Take 1 tablet (20 mg total) by mouth as needed.      glimepiride 4 MG tablet   Commonly known as: AMARYL   Take 1 tablet (4 mg total) by mouth 2 (two) times daily.      HYDROcodone-acetaminophen 10-325 MG per tablet   Commonly known as: NORCO   Take 1 tablet by mouth every 6 (six) hours as needed for pain.      metFORMIN 500 MG tablet   Commonly known as: GLUCOPHAGE   Take 1 tablet (500 mg total) by mouth 2 (two) times daily with a meal.      mulitivitamin with minerals Tabs   Take 1 tablet by mouth daily.      nitroGLYCERIN 0.4 MG SL tablet   Commonly known as: NITROSTAT   Place 0.4 mg under the tongue every 5 (five) minutes as needed. For chest pain.      prasugrel 10 MG Tabs   Commonly known as: EFFIENT   Take 1 tablet (10 mg total) by mouth daily.      pravastatin 80 MG tablet   Commonly known as: PRAVACHOL   Take 1 tablet (80 mg total) by mouth every evening.      predniSONE 5 MG tablet   Commonly known as: DELTASONE   Take 5 mg by mouth daily. Taken with 1mg  tab to equal 6mg  daily.       predniSONE 1 MG tablet   Commonly known as: DELTASONE   Take 1 mg by mouth daily. Taken with 5mg  tab to equal 6mg  daily.      spironolactone 25 MG tablet   Commonly known as: ALDACTONE   Take 1 tablet (25 mg total) by mouth daily.            Disposition and Follow-up: Will arrange transport to [facility]  Consults:  none  Significant Diagnostic Studies:  No results found.  Brief H and P: Pt is 71 yo male with history of prostate cancer back in 2009 who presents to [facility] ED with main concern of progressive urinary retention for which he has been seen at [facility] ER few days prior to admission. H has had foley placed and has tolerated this well but has started to pass blood clots.  In addition, he has been seen by Dr. Earlene Plater and has had dilation procedure done last week and pt reports having urinary retention since. Here in [facility] ED he has had foley placed with difficulty. He has started to pass clots again and has had more discomfort and leaking. Foley was taken out in ER and t has not passed any urine since. Pt denies any fever, chills, no other specific abdominal concerns. In addition to urinary retention, he reports urinary urgency, dysuria when trying to void, hematuria. Pt also treated yesterday with Keflex for Left lower extremity cellulitis but tells me that this has not been getting better.    Physical Exam on Discharge:  Filed Vitals:   04/17/12 1118 04/17/12 1257 04/17/12 1412 04/17/12 1445  BP: 133/62   144/62  Pulse: 103 71  102  Temp: 98.4 F (36.9 C)   98.3 F (36.8 C)  TempSrc: Oral   Oral  Resp: 16   17  Height:   5\' 10"  (1.778 m) 5\' 10"  (1.778 m)  Weight:   108.863 kg (240 lb) 110.1 kg (242 lb 11.6 oz)  SpO2: 97%   95%     Intake/Output Summary (Last 24 hours) at 04/17/12 1926 Last data filed at 04/17/12 1500  Gross per 24 hour  Intake    360 ml  Output    700 ml  Net   -340 ml    Constitutional: Vital signs reviewed. Patient is in no acute distress  and cooperative with exam. Alert and oriented x3.  Head: Normocephalic and atraumatic  Ear: TM normal bilaterally  Mouth: no erythema or exudates, MMM  Eyes: PERRL, EOMI, conjunctivae normal, No scleral icterus.  Neck: Supple, Trachea midline normal ROM, No JVD, mass, thyromegaly, or carotid bruit present.  Cardiovascular: RRR, S1 normal, S2 normal, no MRG, pulses symmetric and intact bilaterally  Pulmonary/Chest: CTAB, no wheezes, rales, or rhonchi  Abdominal: Soft. Non-tender, non-distended, bowel sounds are normal, no masses, organomegaly, or guarding present.  GU: no CVA tenderness Musculoskeletal: No joint deformities, erythema, or stiffness, ROM full and no nontender Ext: no cyanosis, pulses palpable bilaterally (DP and PT), Rash (erythema of the posterior aspect left calf) noted. Edema and tenderness around the same area.  Hematology: no cervical, inginal, or axillary adenopathy.  Neurological: A&O x3, Strenght is normal and symmetric bilaterally, cranial nerve II-XII are grossly intact, no focal motor deficit, sensory intact to light touch bilaterally.  Skin: Warm, dry and intact. No rash, cyanosis, or clubbing.  Psychiatric: Normal mood and affect. speech and behavior is normal. Judgment and thought content normal. Cognition and memory are normal.    CBC:    Component Value Date/Time   WBC 13.7* 04/17/2012 1150   HGB 11.0* 04/17/2012 1150   HCT 34.1* 04/17/2012 1150   PLT 300 04/17/2012 1150   MCV 87.2 04/17/2012 1150   NEUTROABS 11.3* 04/17/2012 1150   LYMPHSABS 1.1 04/17/2012 1150   MONOABS 1.2* 04/17/2012 1150   EOSABS 0.1 04/17/2012 1150   BASOSABS 0.0 04/17/2012 1150    Basic Metabolic Panel:    Component Value Date/Time   NA 132* 04/17/2012 1150   K 4.4 04/17/2012 1150   CL 94* 04/17/2012 1150   CO2 25 04/17/2012 1150   BUN 12 04/17/2012 1150   CREATININE 0.82 04/17/2012 1150   CREATININE 0.78 06/30/2011 1702   GLUCOSE 381* 04/17/2012 1150   CALCIUM 9.1 04/17/2012 1150    Hospital Course:    Assessment/Plan  Urinary retention  - pt has  had dilation procedure done last week by Dr, Earlene Plater at [facility]  - urologist on call at [facility] wants to have pt transferred to [facility] for further evaluation and management  - I spoke with urologist on call at [facility] and they will be accepting the patient  - please note that foley catheter was placed earlier in ED but was leaking and pt has had discomfort with passing blood clots  - foley was taken out and pt has only passed < 5 cc of urine with blot clot  - will leave Foley out for now as recommended   Left leg cellulitis  - not significantly improved on Keflex  - one dose of Vancomycin given here   Leukocytosis  - most likely secondary to cellulitis  - continue antibiotics   Disposition  - will arrange transport to [facility]  Time spent on Discharge: Over 30 minutes  Signed: Debbora Presto 04/17/2012, 7:26 PM  [facility], pager #: [phone] Main office number: [phone]

## 2012-04-17 NOTE — ED Notes (Addendum)
First attempt to call report to floor rn. floor rn reports he will call back ed rn.

## 2012-04-17 NOTE — H&P (Signed)
PCP:  Kristian Covey, MD, MD   DOA:  04/17/2012 11:13 AM  Chief Complaint:  Left leg swelling and urinary retention  HPI: Pt is 71 yo male with history of prostate cancer back in 2009 who presents to [facility] ED with main concern of progressive urinary retention for which he has been seen at [facility] ER few days prior to admission. H has had foley placed and has tolerated this well but has started to pass blood clots. In addition, he has been seen by Dr. Earlene Plater and has had dilation procedure done last week and pt reports having urinary retention since. Here in [facility] ED he has had foley placed with difficulty. He has started to pass clots again and has had more discomfort and leaking. Foley was taken out in ER and t has not passed any urine since. Pt denies any fever, chills, no other specific abdominal concerns. In addition to urinary retention, he reports urinary urgency, dysuria when trying to void, hematuria. Pt also treated yesterday with Keflex for Left lower extremity cellulitis but tells me that this has not been getting better.   Allergies: Allergies  Allergen Reactions  . Atorvastatin     REACTION: sore legs    Prior to Admission medications   Medication Sig Start Date End Date Taking? Authorizing Provider  aspirin 81 MG tablet Take 81 mg by mouth daily.     Yes Historical Provider, MD  carvedilol (COREG) 12.5 MG tablet Take 18.75 mg by mouth 2 (two) times daily with a meal.   Yes Historical Provider, MD  cephALEXin (KEFLEX) 500 MG capsule Take 1 capsule (500 mg total) by mouth 3 (three) times daily. 04/13/12 04/23/12 Yes Kristian Covey, MD  Coenzyme Q10 (COQ-10) 200 MG CAPS Take 1 capsule by mouth daily.   Yes Historical Provider, MD  digoxin (LANOXIN) 0.125 MG tablet Take 125 mcg by mouth daily.   Yes Historical Provider, MD  furosemide (LASIX) 20 MG tablet Take 1 tablet (20 mg total) by mouth as needed. 03/09/12  Yes Laurey Morale, MD  glimepiride (AMARYL) 4 MG tablet Take 1 tablet  (4 mg total) by mouth 2 (two) times daily. 01/27/12 01/26/13 Yes Kristian Covey, MD  HYDROcodone-acetaminophen (NORCO) 10-325 MG per tablet Take 1 tablet by mouth every 6 (six) hours as needed for pain. 04/15/12 04/25/12 Yes Kristian Covey, MD  lisinopril (PRINIVIL,ZESTRIL) 10 MG tablet Take 1 tablet (10 mg total) by mouth 2 (two) times daily. 01/07/12  Yes Laurey Morale, MD  metFORMIN (GLUCOPHAGE) 500 MG tablet Take 1 tablet (500 mg total) by mouth 2 (two) times daily with a meal. 03/17/12 03/17/13 Yes Kristian Covey, MD  Multiple Vitamin (MULITIVITAMIN WITH MINERALS) TABS Take 1 tablet by mouth daily.   Yes Historical Provider, MD  nitroGLYCERIN (NITROSTAT) 0.4 MG SL tablet Place 0.4 mg under the tongue every 5 (five) minutes as needed. For chest pain.   Yes Historical Provider, MD  prasugrel (EFFIENT) 10 MG TABS Take 1 tablet (10 mg total) by mouth daily. 12/30/11  Yes Laurey Morale, MD  pravastatin (PRAVACHOL) 80 MG tablet Take 1 tablet (80 mg total) by mouth every evening. 01/26/12 01/25/13 Yes Laurey Morale, MD  predniSONE (DELTASONE) 1 MG tablet Take 1 mg by mouth daily. Taken with 5mg  tab to equal 6mg  daily.   Yes Historical Provider, MD  predniSONE (DELTASONE) 5 MG tablet Take 5 mg by mouth daily. Taken with 1mg  tab to equal 6mg  daily.  Yes Historical Provider, MD  spironolactone (ALDACTONE) 25 MG tablet Take 1 tablet (25 mg total) by mouth daily. 01/07/12  Yes Laurey Morale, MD    Past Medical History  Diagnosis Date  . DM 06/20/2009  . HYPERLIPIDEMIA 06/20/2009  . HYPERTENSION 06/20/2009  . CORONARY ATHEROSCLEROSIS NATIVE CORONARY ARTERY 01/27/2011    s/p PCI 2/12  . Herpes zoster ophthalmicus   . Arthritis   . Obesity   . GERD (gastroesophageal reflux disease)   . Ischemic cardiomyopathy 2/12    EF 31%  . Adenocarcinoma of prostate     s/p seed implants  . Polymyalgia rheumatica     Past Surgical History  Procedure Date  . Prostate surgery     cancer, seed implant     Social History:  reports that he quit smoking about 23 years ago. His smoking use included Cigarettes. He has a 6 pack-year smoking history. He has never used smokeless tobacco. He reports that he does not drink alcohol or use illicit drugs.  Family History  Problem Relation Age of Onset  . Cancer Mother 9    unknown CA  . Heart disease Father 47    MI, died  . Alcohol abuse Father     Review of Systems:  Constitutional: Denies fever, chills, diaphoresis, appetite change and fatigue.  HEENT: Denies photophobia, eye pain, redness, hearing loss, ear pain, congestion, sore throat, rhinorrhea, sneezing, mouth sores, trouble swallowing, neck pain, neck stiffness and tinnitus.   Respiratory: Denies SOB, DOE, cough, chest tightness,  and wheezing.   Cardiovascular: Denies chest pain, palpitations and leg swelling.  Gastrointestinal: Denies nausea, vomiting, abdominal pain, diarrhea, constipation, blood in stool and abdominal distention.  Genitourinary: per HPI Musculoskeletal: Denies myalgias, back pain, joint swelling, arthralgias and gait problem.  Skin: Denies pallor, rash, left lower extremity redness and warmth to tuch Neurological: Denies dizziness, seizures, syncope, weakness, light-headedness, numbness and headaches.  Hematological: Denies adenopathy. Easy bruising, personal or family bleeding history  Psychiatric/Behavioral: Denies suicidal ideation, mood changes, confusion, nervousness, sleep disturbance and agitation  Physical Exam:  Filed Vitals:   04/17/12 1118 04/17/12 1257  BP: 133/62   Pulse: 103 71  Temp: 98.4 F (36.9 C)   TempSrc: Oral   Resp: 16   SpO2: 97%     Constitutional: Vital signs reviewed.  Patient is in no acute distress and cooperative with exam. Alert and oriented x3.  Head: Normocephalic and atraumatic Ear: TM normal bilaterally Mouth: no erythema or exudates, MMM Eyes: PERRL, EOMI, conjunctivae normal, No scleral icterus.  Neck: Supple,  Trachea midline normal ROM, No JVD, mass, thyromegaly, or carotid bruit present.  Cardiovascular: RRR, S1 normal, S2 normal, no MRG, pulses symmetric and intact bilaterally Pulmonary/Chest: CTAB, no wheezes, rales, or rhonchi Abdominal: Soft. Non-tender, non-distended, bowel sounds are normal, no masses, organomegaly, or guarding present.  GU: no CVA tenderness Musculoskeletal: No joint deformities, erythema, or stiffness, ROM full and no nontender Ext: no cyanosis, pulses palpable bilaterally (DP and PT), Rash (erythema of the posterior aspect left calf) noted. Edema and tenderness around the same area. Hematology: no cervical, inginal, or axillary adenopathy.  Neurological: A&O x3, Strenght is normal and symmetric bilaterally, cranial nerve II-XII are grossly intact, no focal motor deficit, sensory intact to light touch bilaterally.  Skin: Warm, dry and intact. No rash, cyanosis, or clubbing.  Psychiatric: Normal mood and affect. speech and behavior is normal. Judgment and thought content normal. Cognition and memory are normal.   Labs on Admission:  Results for orders placed during the hospital encounter of 04/17/12 (from the past 48 hour(s))  CBC     Status: Abnormal   Collection Time   04/17/12 11:50 AM      Component Value Range Comment   WBC 13.7 (*) 4.0 - 10.5 (K/uL)    RBC 3.91 (*) 4.22 - 5.81 (MIL/uL)    Hemoglobin 11.0 (*) 13.0 - 17.0 (g/dL)    HCT 16.1 (*) 09.6 - 52.0 (%)    MCV 87.2  78.0 - 100.0 (fL)    MCH 28.1  26.0 - 34.0 (pg)    MCHC 32.3  30.0 - 36.0 (g/dL)    RDW 04.5  40.9 - 81.1 (%)    Platelets 300  150 - 400 (K/uL)   BASIC METABOLIC PANEL     Status: Abnormal   Collection Time   04/17/12 11:50 AM      Component Value Range Comment   Sodium 132 (*) 135 - 145 (mEq/L)    Potassium 4.4  3.5 - 5.1 (mEq/L)    Chloride 94 (*) 96 - 112 (mEq/L)    CO2 25  19 - 32 (mEq/L)    Glucose, Bld 381 (*) 70 - 99 (mg/dL)    BUN 12  6 - 23 (mg/dL)    Creatinine, Ser 9.14  0.50 -  1.35 (mg/dL)    Calcium 9.1  8.4 - 10.5 (mg/dL)    GFR calc non Af Amer 89 (*) >90 (mL/min)    GFR calc Af Amer >90  >90 (mL/min)   PROTIME-INR     Status: Normal   Collection Time   04/17/12 11:50 AM      Component Value Range Comment   Prothrombin Time 15.1  11.6 - 15.2 (seconds)    INR 1.17  0.00 - 1.49    URINALYSIS, ROUTINE W REFLEX MICROSCOPIC     Status: Abnormal   Collection Time   04/17/12 12:07 PM      Component Value Range Comment   Color, Urine RED (*) YELLOW  BIOCHEMICALS MAY BE AFFECTED BY COLOR   APPearance CLOUDY (*) CLEAR     Specific Gravity, Urine 1.043 (*) 1.005 - 1.030     pH 6.0  5.0 - 8.0     Glucose, UA >1000 (*) NEGATIVE (mg/dL)    Hgb urine dipstick LARGE (*) NEGATIVE     Bilirubin Urine MODERATE (*) NEGATIVE     Ketones, ur >80 (*) NEGATIVE (mg/dL)    Protein, ur 782 (*) NEGATIVE (mg/dL)    Urobilinogen, UA 1.0  0.0 - 1.0 (mg/dL)    Nitrite NEGATIVE  NEGATIVE     Leukocytes, UA SMALL (*) NEGATIVE    URINE MICROSCOPIC-ADD ON     Status: Normal   Collection Time   04/17/12 12:07 PM      Component Value Range Comment   Squamous Epithelial / LPF RARE  RARE     WBC, UA 0-2  <3 (WBC/hpf)    RBC / HPF TOO NUMEROUS TO COUNT  <3 (RBC/hpf)    Urine-Other FIELD OBSCURED BY RBC'S       Radiological Exams on Admission: No results found.  Assessment/Plan  Urinary retention - pt has had dilation procedure done last week by Dr, Earlene Plater at [facility] - urologist on call at [facility] wants to have pt transferred to [facility] for further evaluation and management - I spoke with urologist on call at [facility] and they will be accepting the patient - please note  that foley catheter was placed earlier in ED but was leaking and pt has had discomfort with passing blood clots - foley was taken out and pt has only passed < 5 cc of urine with blot clot - will leave Foley out for now as recommended  Left leg cellulitis - not significantly improved on Keflex - one dose of  Vancomycin given here  Leukocytosis - most likely secondary to cellulitis - continue antibiotics  Disposition - will arrange transport to [facility]  DVT Prophylaxis - Lovenox  Code Status - Full  Education  - test results and diagnostic studies were discussed with patient  - patient verbalized the understanding - questions were answered at the bedside and contact information was provided for additional questions or concerns  Time Spent on Admission: Over 30 minutes  Wayne [name] 04/17/2012, 1:23 PM  [facility] Pager # [phone] Main Office # [phone]

## 2012-04-17 NOTE — Discharge Instructions (Signed)
Acute Urinary Retention, Male You have been seen by a caregiver today because of your inability to urinate (pass your water). This is a common problem in elderly males. As men age their prostates become larger and block the flow of urine from the bladder. This is usually a problem that has come on gradually. It is often first noticed by having to get up at night to urinate. This is because as the prostate enlarges it is more difficult to empty the bladder completely. Treatment may involve a one time catheterization to empty the bladder. This is putting in a tube to drain your urine. Then you and your personal caregiver can decide at your earliest convenience how to handle this problem in the future. It may also be a problem that may not recur for years. Sometimes this problem can be caused by medications. In this case, all that is often necessary is to discontinue the offending agent. If you are to leave the foley catheter (a long, narrow, hollow tube) in and go home with a drainage system, you will need to discuss the best course of action with your caregiver. While the catheter is in, maintain a good intake of fluids. Keep the drainage bag emptied and lower than your catheter. This is so contaminated (infected) urine will not be flowing back into your bladder. This could lead to a urinary tract infection. Only take over-the-counter or prescription medicines for pain, discomfort, or fever as directed by your caregiver.  SEEK IMMEDIATE MEDICAL CARE IF:  You develop chills, fever, or show signs of generalized illness that occurs prior to seeing your caregiver. Document Released: 03/09/2001 Document Revised: 11/20/2011 Document Reviewed: 11/22/2008 [employer] Patient Information 2012 [employer]. 

## 2012-04-17 NOTE — ED Provider Notes (Signed)
History     CSN: [id number]  Arrival date & time 04/17/12  1113   First MD Initiated Contact with Patient 04/17/12 1118      Chief Complaint  Patient presents with  . Hematuria    HPI The patient has history of prostate cancer back in 2009.  Pt had been having trouble urinating recently so he saw his urologist, Dr. Earlene Plater and had a dilation procedure, cystoscopy with dilation of the prostatic urethra.  This procedure was done on  May 2  .  Pt was seen in the ED for urinary retention yesterday.  The patient called his urologist and was started on Flomax but did not actually see the patient in the office. His urologist is out of town and will not be back until Monday. Today he noticed blood in his urine and became concerned so he came to the emergency department.   Patient incidentally has also been having persistent left lower extremity pain. The patient and his wife states he has had an outpatient workup that included Doppler studies  They did not show evidence of deep venous thrombosis but did show signs of a hematoma. Patient was started on Keflex by his primary care doctor for this.  He still continues to have discomfort in his left leg although has not had any fevers.  . Past Medical History  Diagnosis Date  . DM 06/20/2009  . HYPERLIPIDEMIA 06/20/2009  . HYPERTENSION 06/20/2009  . CORONARY ATHEROSCLEROSIS NATIVE CORONARY ARTERY 01/27/2011    s/p PCI 2/12  . Herpes zoster ophthalmicus   . Arthritis   . Obesity   . GERD (gastroesophageal reflux disease)   . Ischemic cardiomyopathy 2/12    EF 31%  . Adenocarcinoma of prostate     s/p seed implants  . Polymyalgia rheumatica     Past Surgical History  Procedure Date  . Prostate surgery     cancer, seed implant    Family History  Problem Relation Age of Onset  . Cancer Mother 65    unknown CA  . Heart disease Father 67    MI, died  . Alcohol abuse Father     History  Substance Use Topics  . Smoking status: Former Smoker --  0.3 packs/day for 20 years    Types: Cigarettes    Quit date: 02/23/1989  . Smokeless tobacco: Never Used  . Alcohol Use: No      Review of Systems  All other systems reviewed and are negative.    Allergies  Atorvastatin  Home Medications   Current Outpatient Rx  Name Route Sig Dispense Refill  . ASPIRIN 81 MG PO TABS Oral Take 81 mg by mouth daily.      Marland Kitchen CARVEDILOL 12.5 MG PO TABS  Take one and one half tablets by mouth twice daily 90 tablet 6  . CEPHALEXIN 500 MG PO CAPS Oral Take 1 capsule (500 mg total) by mouth 3 (three) times daily. 30 capsule 0  . COENZYME Q10 200 MG PO TABS  Take one daily 90 tablet 3  . DIGOXIN 0.125 MG PO TABS  TAKE 1 TABLET EVERY DAY 30 tablet 6  . FUROSEMIDE 20 MG PO TABS Oral Take 1 tablet (20 mg total) by mouth as needed. 15 tablet 6  . GLIMEPIRIDE 4 MG PO TABS Oral Take 1 tablet (4 mg total) by mouth 2 (two) times daily. 60 tablet 6  . HYDROCODONE-ACETAMINOPHEN 10-325 MG PO TABS Oral Take 1 tablet by mouth every 6 (six)  hours as needed for pain. 20 tablet 0  . LISINOPRIL 10 MG PO TABS Oral Take 1 tablet (10 mg total) by mouth 2 (two) times daily. 60 tablet 6  . METFORMIN HCL 500 MG PO TABS Oral Take 1 tablet (500 mg total) by mouth 2 (two) times daily with a meal. 60 tablet 3  . CENTRUM SILVER ULTRA MENS PO Oral Take 1 tablet by mouth.      Marland Kitchen NITROGLYCERIN 0.4 MG SL SUBL Sublingual Place 0.4 mg under the tongue every 5 (five) minutes as needed.      Marland Kitchen PRASUGREL HCL 10 MG PO TABS Oral Take 1 tablet (10 mg total) by mouth daily. 90 tablet 3  . PRAVASTATIN SODIUM 80 MG PO TABS Oral Take 1 tablet (80 mg total) by mouth every evening. 30 tablet 6  . PREDNISONE 10 MG PO TABS  5 mg. Pt taking a 1 mg tab also to equal 6 mg for one month, decreasing by 1 mg every month    . SPIRONOLACTONE 25 MG PO TABS Oral Take 1 tablet (25 mg total) by mouth daily. 30 tablet 3  . TRIAMCINOLONE ACETONIDE 0.5 % EX CREA Topical Apply topically 2 (two) times daily. 45 g 2     BP 133/62  Pulse 103  Temp(Src) 98.4 F (36.9 C) (Oral)  Resp 16  SpO2 97%  Physical Exam  Nursing note and vitals reviewed. Constitutional: He appears well-developed and well-nourished. No distress.  HENT:  Head: Normocephalic and atraumatic.  Right Ear: External ear normal.  Left Ear: External ear normal.  Eyes: Conjunctivae are normal. Right eye exhibits no discharge. Left eye exhibits no discharge. No scleral icterus.  Neck: Neck supple. No tracheal deviation present.  Cardiovascular: Normal rate, regular rhythm and intact distal pulses.   Pulmonary/Chest: Effort normal and breath sounds normal. No stridor. No respiratory distress. He has no wheezes. He has no rales.  Abdominal: Soft. Bowel sounds are normal. He exhibits no distension. There is no tenderness. There is no rebound and no guarding.  Genitourinary:       Dried blood around the urethral meatus, Foley catheter in place, room colored urine in the catheter bag  Musculoskeletal: He exhibits no edema and no tenderness.       Edema and tenderness left calf  Neurological: He is alert. He has normal strength. No sensory deficit. Cranial nerve deficit:  no gross defecits noted. He exhibits normal muscle tone. He displays no seizure activity. Coordination normal.  Skin: Skin is warm and dry. Rash (erythema of the posterior aspect left calf) noted.  Psychiatric: He has a normal mood and affect.    ED Course  Procedures (including critical care time)  Rate: 96  Rhythm: normal sinus rhythm  QRS Axis: normal  Intervals: normal  ST/T Wave abnormalities: normal  Conduction Disutrbances:none  Narrative Interpretation: Premature ventricular complexes  Old EKG Reviewed: nonspecific st t changes resolved  Labs Reviewed  CBC - Abnormal; Notable for the following:    WBC 13.7 (*)    RBC 3.91 (*)    Hemoglobin 11.0 (*)    HCT 34.1 (*)    All other components within normal limits  DIFFERENTIAL - Abnormal; Notable for the  following:    Neutrophils Relative 83 (*)    Neutro Abs 11.3 (*)    Lymphocytes Relative 8 (*)    Monocytes Absolute 1.2 (*)    All other components within normal limits  BASIC METABOLIC PANEL - Abnormal; Notable for the  following:    Sodium 132 (*)    Chloride 94 (*)    Glucose, Bld 381 (*)    GFR calc non Af Amer 89 (*)    All other components within normal limits  URINALYSIS, ROUTINE W REFLEX MICROSCOPIC - Abnormal; Notable for the following:    Color, Urine RED (*) BIOCHEMICALS MAY BE AFFECTED BY COLOR   APPearance CLOUDY (*)    Specific Gravity, Urine 1.043 (*)    Glucose, UA >1000 (*)    Hgb urine dipstick LARGE (*)    Bilirubin Urine MODERATE (*)    Ketones, ur >80 (*)    Protein, ur 100 (*)    Leukocytes, UA SMALL (*)    All other components within normal limits  PROTIME-INR  URINE MICROSCOPIC-ADD ON  URINE CULTURE   No results found.     MDM  The patient's hematuria has improved after irrigation. The urine now appears to be just a light pink color. His family however is concerned about the persistent cellulitis. Patient has already been on antibiotics for at least 48 hours and he has not noticed any improvement and in fact think she could be getting worse. Patient is a diabetic and is having hyperglycemia and leukocytosis. I will consult the hospitalist for admission regarding the persistent infection.       Celene Kras, MD 04/17/12 [phone]

## 2012-04-17 NOTE — ED Notes (Signed)
Report given to conrad, rn on floor

## 2012-04-17 NOTE — ED Notes (Signed)
Per ptar pt is from home. Alert and oriented x4, ambulatory with cruthes. Pt has hx of 4 stents. Pt has prostate cancer, has a foley in place. ptar called because pt has noticed blood in urine.

## 2012-04-17 NOTE — ED Notes (Signed)
Pt c/o "heart pounding in chest". Stated that this just started. EKG done. Given to [facility] who gave to  Dr. Roselyn Bering. Pt has cardiac hx

## 2012-04-18 LAB — URINE CULTURE: Culture: NO GROWTH

## 2012-04-19 ENCOUNTER — Ambulatory Visit: Payer: Medicare Other | Admitting: Family Medicine

## 2012-04-19 ENCOUNTER — Telehealth: Payer: Self-pay | Admitting: Family Medicine

## 2012-04-19 NOTE — Telephone Encounter (Signed)
Please advise, would you like me to call pt?

## 2012-04-19 NOTE — Telephone Encounter (Signed)
Yes.  He was apparently admitted for hematuria-had recent urology procedure.

## 2012-04-19 NOTE — Telephone Encounter (Signed)
Pts wife called and said that pt has been in hospital over the wkend and was discharged last night. Pts wife is req to rcv call back from Dr Caryl Never today asap.

## 2012-04-19 NOTE — Telephone Encounter (Signed)
I called pt, he was D/C from [facility] with cath still in, uncomfortable however his leg is slowly improving.  For now he is going to stay in and cont to try to be patient in his recovery.  I told him we are her when ever he needs [place] again.

## 2012-04-20 ENCOUNTER — Ambulatory Visit: Payer: Medicare Other | Admitting: Family Medicine

## 2012-04-21 ENCOUNTER — Ambulatory Visit: Payer: Medicare Other | Admitting: Family Medicine

## 2012-04-22 ENCOUNTER — Encounter (HOSPITAL_COMMUNITY): Payer: Self-pay

## 2012-04-22 ENCOUNTER — Encounter: Payer: Self-pay | Admitting: Family Medicine

## 2012-04-22 ENCOUNTER — Inpatient Hospital Stay (HOSPITAL_COMMUNITY)
Admission: AD | Admit: 2012-04-22 | Discharge: 2012-04-25 | DRG: 315 | Disposition: A | Discharge status: Home / Self Care | Payer: Medicare Other | Source: Ambulatory Visit | Attending: Internal Medicine | Admitting: Internal Medicine

## 2012-04-22 ENCOUNTER — Inpatient Hospital Stay (HOSPITAL_COMMUNITY): Payer: Medicare Other

## 2012-04-22 ENCOUNTER — Ambulatory Visit (INDEPENDENT_AMBULATORY_CARE_PROVIDER_SITE_OTHER): Payer: Medicare Other | Admitting: Family Medicine

## 2012-04-22 VITALS — BP 82/62 | Temp 98.0°F

## 2012-04-22 DIAGNOSIS — R21 Rash and other nonspecific skin eruption: Secondary | ICD-10-CM | POA: Diagnosis present

## 2012-04-22 DIAGNOSIS — I2510 Atherosclerotic heart disease of native coronary artery without angina pectoris: Secondary | ICD-10-CM | POA: Diagnosis present

## 2012-04-22 DIAGNOSIS — E1365 Other specified diabetes mellitus with hyperglycemia: Secondary | ICD-10-CM | POA: Diagnosis present

## 2012-04-22 DIAGNOSIS — IMO0001 Reserved for inherently not codable concepts without codable children: Secondary | ICD-10-CM | POA: Diagnosis not present

## 2012-04-22 DIAGNOSIS — M129 Arthropathy, unspecified: Secondary | ICD-10-CM | POA: Diagnosis present

## 2012-04-22 DIAGNOSIS — IMO0002 Reserved for concepts with insufficient information to code with codable children: Secondary | ICD-10-CM | POA: Diagnosis present

## 2012-04-22 DIAGNOSIS — G4733 Obstructive sleep apnea (adult) (pediatric): Secondary | ICD-10-CM

## 2012-04-22 DIAGNOSIS — I2589 Other forms of chronic ischemic heart disease: Secondary | ICD-10-CM | POA: Diagnosis present

## 2012-04-22 DIAGNOSIS — Z87891 Personal history of nicotine dependence: Secondary | ICD-10-CM

## 2012-04-22 DIAGNOSIS — E1165 Type 2 diabetes mellitus with hyperglycemia: Secondary | ICD-10-CM

## 2012-04-22 DIAGNOSIS — R079 Chest pain, unspecified: Secondary | ICD-10-CM

## 2012-04-22 DIAGNOSIS — M76899 Other specified enthesopathies of unspecified lower limb, excluding foot: Secondary | ICD-10-CM

## 2012-04-22 DIAGNOSIS — Z888 Allergy status to other drugs, medicaments and biological substances status: Secondary | ICD-10-CM

## 2012-04-22 DIAGNOSIS — E119 Type 2 diabetes mellitus without complications: Secondary | ICD-10-CM | POA: Diagnosis not present

## 2012-04-22 DIAGNOSIS — C61 Malignant neoplasm of prostate: Secondary | ICD-10-CM | POA: Diagnosis present

## 2012-04-22 DIAGNOSIS — M79609 Pain in unspecified limb: Secondary | ICD-10-CM | POA: Diagnosis not present

## 2012-04-22 DIAGNOSIS — L03116 Cellulitis of left lower limb: Secondary | ICD-10-CM

## 2012-04-22 DIAGNOSIS — N400 Enlarged prostate without lower urinary tract symptoms: Secondary | ICD-10-CM | POA: Diagnosis present

## 2012-04-22 DIAGNOSIS — R0602 Shortness of breath: Secondary | ICD-10-CM

## 2012-04-22 DIAGNOSIS — R109 Unspecified abdominal pain: Secondary | ICD-10-CM

## 2012-04-22 DIAGNOSIS — L02419 Cutaneous abscess of limb, unspecified: Secondary | ICD-10-CM | POA: Diagnosis not present

## 2012-04-22 DIAGNOSIS — M7981 Nontraumatic hematoma of soft tissue: Secondary | ICD-10-CM | POA: Diagnosis not present

## 2012-04-22 DIAGNOSIS — E1351 Other specified diabetes mellitus with diabetic peripheral angiopathy without gangrene: Secondary | ICD-10-CM | POA: Diagnosis present

## 2012-04-22 DIAGNOSIS — M353 Polymyalgia rheumatica: Secondary | ICD-10-CM | POA: Diagnosis present

## 2012-04-22 DIAGNOSIS — I5022 Chronic systolic (congestive) heart failure: Secondary | ICD-10-CM | POA: Diagnosis present

## 2012-04-22 DIAGNOSIS — B0239 Other herpes zoster eye disease: Secondary | ICD-10-CM

## 2012-04-22 DIAGNOSIS — Z9861 Coronary angioplasty status: Secondary | ICD-10-CM | POA: Diagnosis not present

## 2012-04-22 DIAGNOSIS — E669 Obesity, unspecified: Secondary | ICD-10-CM

## 2012-04-22 DIAGNOSIS — N401 Enlarged prostate with lower urinary tract symptoms: Secondary | ICD-10-CM | POA: Diagnosis present

## 2012-04-22 DIAGNOSIS — T79A29A Traumatic compartment syndrome of unspecified lower extremity, initial encounter: Secondary | ICD-10-CM | POA: Diagnosis not present

## 2012-04-22 DIAGNOSIS — M7989 Other specified soft tissue disorders: Secondary | ICD-10-CM | POA: Diagnosis present

## 2012-04-22 DIAGNOSIS — K921 Melena: Secondary | ICD-10-CM

## 2012-04-22 DIAGNOSIS — I509 Heart failure, unspecified: Secondary | ICD-10-CM | POA: Diagnosis not present

## 2012-04-22 DIAGNOSIS — I428 Other cardiomyopathies: Secondary | ICD-10-CM

## 2012-04-22 DIAGNOSIS — E785 Hyperlipidemia, unspecified: Secondary | ICD-10-CM | POA: Diagnosis present

## 2012-04-22 DIAGNOSIS — R58 Hemorrhage, not elsewhere classified: Secondary | ICD-10-CM | POA: Diagnosis not present

## 2012-04-22 DIAGNOSIS — N138 Other obstructive and reflux uropathy: Secondary | ICD-10-CM | POA: Diagnosis present

## 2012-04-22 DIAGNOSIS — R252 Cramp and spasm: Secondary | ICD-10-CM | POA: Diagnosis not present

## 2012-04-22 DIAGNOSIS — L259 Unspecified contact dermatitis, unspecified cause: Secondary | ICD-10-CM

## 2012-04-22 DIAGNOSIS — K219 Gastro-esophageal reflux disease without esophagitis: Secondary | ICD-10-CM | POA: Diagnosis present

## 2012-04-22 DIAGNOSIS — Z79899 Other long term (current) drug therapy: Secondary | ICD-10-CM | POA: Diagnosis not present

## 2012-04-22 DIAGNOSIS — I10 Essential (primary) hypertension: Secondary | ICD-10-CM | POA: Diagnosis present

## 2012-04-22 DIAGNOSIS — B356 Tinea cruris: Secondary | ICD-10-CM | POA: Diagnosis not present

## 2012-04-22 DIAGNOSIS — J209 Acute bronchitis, unspecified: Secondary | ICD-10-CM

## 2012-04-22 LAB — PROTIME-INR: INR: 1.04 (ref 0.00–1.49)

## 2012-04-22 LAB — COMPREHENSIVE METABOLIC PANEL
AST: 29 U/L (ref 0–37)
Albumin: 2.3 g/dL — ABNORMAL LOW (ref 3.5–5.2)
Calcium: 9.2 mg/dL (ref 8.4–10.5)
Chloride: 99 mEq/L (ref 96–112)
Creatinine, Ser: 0.83 mg/dL (ref 0.50–1.35)
Total Bilirubin: 0.2 mg/dL — ABNORMAL LOW (ref 0.3–1.2)

## 2012-04-22 LAB — CBC
HCT: 34.2 % — ABNORMAL LOW (ref 39.0–52.0)
MCHC: 31.6 g/dL (ref 30.0–36.0)
RDW: 13.7 % (ref 11.5–15.5)

## 2012-04-22 LAB — DIFFERENTIAL
Basophils Absolute: 0 10*3/uL (ref 0.0–0.1)
Basophils Relative: 0 % (ref 0–1)
Monocytes Absolute: 0.7 10*3/uL (ref 0.1–1.0)
Neutro Abs: 8.4 10*3/uL — ABNORMAL HIGH (ref 1.7–7.7)

## 2012-04-22 LAB — GLUCOSE, CAPILLARY

## 2012-04-22 MED ORDER — CARVEDILOL 6.25 MG PO TABS
18.7500 mg | ORAL_TABLET | Freq: Two times a day (BID) | ORAL | Status: DC
  Administered 2012-04-22 – 2012-04-25 (×6): 18.75 mg via ORAL
  Filled 2012-04-22 (×8): qty 1

## 2012-04-22 MED ORDER — LISINOPRIL 10 MG PO TABS
10.0000 mg | ORAL_TABLET | Freq: Every day | ORAL | Status: DC
  Administered 2012-04-22 – 2012-04-25 (×3): 10 mg via ORAL
  Filled 2012-04-22 (×5): qty 1

## 2012-04-22 MED ORDER — PREDNISONE 5 MG PO TABS
6.0000 mg | ORAL_TABLET | Freq: Every day | ORAL | Status: DC
  Administered 2012-04-23 – 2012-04-25 (×3): 6 mg via ORAL
  Filled 2012-04-22 (×4): qty 1

## 2012-04-22 MED ORDER — ACETAMINOPHEN 650 MG RE SUPP: 650.0000 mg | Freq: Four times a day (QID) | RECTAL | Status: DC | PRN

## 2012-04-22 MED ORDER — SIMVASTATIN 40 MG PO TABS
40.0000 mg | ORAL_TABLET | Freq: Every day | ORAL | Status: DC
  Administered 2012-04-22 – 2012-04-24 (×3): 40 mg via ORAL
  Filled 2012-04-22 (×4): qty 1

## 2012-04-22 MED ORDER — ALBUTEROL SULFATE (5 MG/ML) 0.5% IN NEBU: 2.5000 mg | INHALATION_SOLUTION | RESPIRATORY_TRACT | Status: DC | PRN

## 2012-04-22 MED ORDER — VANCOMYCIN HCL IN DEXTROSE 1-5 GM/200ML-% IV SOLN
1000.0000 mg | Freq: Two times a day (BID) | INTRAVENOUS | Status: DC
  Administered 2012-04-23 – 2012-04-25 (×5): 1000 mg via INTRAVENOUS
  Filled 2012-04-22 (×7): qty 200

## 2012-04-22 MED ORDER — VANCOMYCIN HCL 1000 MG IV SOLR
2250.0000 mg | Freq: Once | INTRAVENOUS | Status: AC
  Administered 2012-04-22: 2250 mg via INTRAVENOUS
  Filled 2012-04-22 (×2): qty 2250

## 2012-04-22 MED ORDER — SODIUM CHLORIDE 0.9 % IV SOLN
INTRAVENOUS | Status: DC
  Administered 2012-04-22 – 2012-04-23 (×2): via INTRAVENOUS

## 2012-04-22 MED ORDER — MORPHINE SULFATE 2 MG/ML IJ SOLN: 1.0000 mg | INTRAMUSCULAR | Status: DC | PRN

## 2012-04-22 MED ORDER — IOHEXOL 300 MG/ML  SOLN
100.0000 mL | Freq: Once | INTRAMUSCULAR | Status: AC | PRN
  Administered 2012-04-22: 100 mL via INTRAVENOUS

## 2012-04-22 MED ORDER — NITROGLYCERIN 0.4 MG SL SUBL: 0.4000 mg | SUBLINGUAL_TABLET | SUBLINGUAL | Status: DC | PRN

## 2012-04-22 MED ORDER — ONDANSETRON HCL 4 MG/2ML IJ SOLN: 4.0000 mg | Freq: Four times a day (QID) | INTRAMUSCULAR | Status: DC | PRN

## 2012-04-22 MED ORDER — DIGOXIN 125 MCG PO TABS
125.0000 ug | ORAL_TABLET | Freq: Every day | ORAL | Status: DC
  Administered 2012-04-22 – 2012-04-25 (×4): 125 ug via ORAL
  Filled 2012-04-22 (×4): qty 1

## 2012-04-22 MED ORDER — ONDANSETRON HCL 4 MG PO TABS: 4.0000 mg | ORAL_TABLET | Freq: Four times a day (QID) | ORAL | Status: DC | PRN

## 2012-04-22 MED ORDER — FUROSEMIDE 20 MG PO TABS
20.0000 mg | ORAL_TABLET | Freq: Every day | ORAL | Status: DC
  Administered 2012-04-22 – 2012-04-25 (×4): 20 mg via ORAL
  Filled 2012-04-22 (×4): qty 1

## 2012-04-22 MED ORDER — INSULIN ASPART 100 UNIT/ML ~~LOC~~ SOLN
0.0000 [IU] | Freq: Three times a day (TID) | SUBCUTANEOUS | Status: DC
  Administered 2012-04-23: 5 [IU] via SUBCUTANEOUS
  Administered 2012-04-23: 3 [IU] via SUBCUTANEOUS
  Administered 2012-04-23 – 2012-04-24 (×2): 5 [IU] via SUBCUTANEOUS
  Administered 2012-04-24: 18:00:00 via SUBCUTANEOUS
  Administered 2012-04-25 (×2): 3 [IU] via SUBCUTANEOUS

## 2012-04-22 MED ORDER — ALUM & MAG HYDROXIDE-SIMETH 200-200-20 MG/5ML PO SUSP: 30.0000 mL | Freq: Four times a day (QID) | ORAL | Status: DC | PRN

## 2012-04-22 MED ORDER — ADULT MULTIVITAMIN W/MINERALS CH
1.0000 | ORAL_TABLET | Freq: Every day | ORAL | Status: DC
  Administered 2012-04-23 – 2012-04-25 (×3): 1 via ORAL
  Filled 2012-04-22 (×4): qty 1

## 2012-04-22 MED ORDER — OXYCODONE HCL 5 MG PO TABS
5.0000 mg | ORAL_TABLET | ORAL | Status: DC | PRN
  Administered 2012-04-22 – 2012-04-24 (×3): 5 mg via ORAL
  Filled 2012-04-22 (×4): qty 1

## 2012-04-22 MED ORDER — SENNA 8.6 MG PO TABS
1.0000 | ORAL_TABLET | Freq: Two times a day (BID) | ORAL | Status: DC
  Administered 2012-04-22 – 2012-04-24 (×4): 8.6 mg via ORAL
  Filled 2012-04-22 (×9): qty 1

## 2012-04-22 MED ORDER — ACETAMINOPHEN 325 MG PO TABS: 650.0000 mg | ORAL_TABLET | Freq: Four times a day (QID) | ORAL | Status: DC | PRN

## 2012-04-22 MED ORDER — TAMSULOSIN HCL 0.4 MG PO CAPS
0.4000 mg | ORAL_CAPSULE | Freq: Every day | ORAL | Status: DC
  Administered 2012-04-22 – 2012-04-25 (×3): 0.4 mg via ORAL
  Filled 2012-04-22 (×4): qty 1

## 2012-04-22 NOTE — Progress Notes (Signed)
  Infectious Disease Pharmacy     Total Antibiotics Day 1         Vancomycin   5/9 --->            Wayne [name] is an 71 y.o. male who presented to [facility] on 04/22/2012 with a chief complaint of worsening left leg swelling.  HT = 178cm, WT 110kg  Filed Vitals:   04/22/12 1251  BP: 127/75  Pulse: 101  Temp: 97.6 F (36.4 C)  Resp: 20    Lab 04/22/12 1410 04/17/12 1150  NA 138 132*  K 3.9 4.4  CL 99 94*  CO2 28 25  GLUCOSE 290* 381*  BUN 11 12  CREATININE 0.83 0.82  CALCIUM 9.2 9.1  MG -- --  PHOS -- --    Lab 04/22/12 1410 04/17/12 1150  WBC 10.8* 13.7*    Past Medical History  Diagnosis Date  . DM 06/20/2009  . HYPERLIPIDEMIA 06/20/2009  . HYPERTENSION 06/20/2009  . CORONARY ATHEROSCLEROSIS NATIVE CORONARY ARTERY 01/27/2011    s/p PCI 2/12  . Herpes zoster ophthalmicus   . Arthritis   . Obesity   . GERD (gastroesophageal reflux disease)   . Ischemic cardiomyopathy 2/12    EF 31%  . Adenocarcinoma of prostate     s/p seed implants  . Polymyalgia rheumatica       Recent Results (from the past 720 hour(s))  URINE CULTURE     Status: Normal   Collection Time   04/17/12 12:07 PM      Component Value Range Status Comment   Specimen Description URINE, CATHETERIZED   Final    Special Requests NONE   Final    Culture  Setup Time [id number]   Final    Colony Count NO GROWTH   Final    Culture NO GROWTH   Final    Report Status 04/18/2012 FINAL   Final     Assessment: 71 year old man to start on vancomycin for possible left leg cellulitis.  Plan:  Vancomycin 2250mg  IV x 1 dose, then 1g IV q12.  Will follow cultures and renal function with medical team.  Celedonio Miyamoto, PharmD, BCPS [phone]

## 2012-04-22 NOTE — H&P (Addendum)
PATIENT DETAILS Name: Wayne [name] Age: 71 y.o. Sex: male Date of Birth: Nov 05, 1941 Admit Date: 04/22/2012 [id number],BMWUX W, MD, MD   CHIEF COMPLAINT:  Left leg pain-now ongoing for the past one month Worsening left leg swelling for the past few days  HPI: Patient is a very pleasant 71 year old white gentleman, with a significant past medical history of coronary disease status post PCI, diabetes, hypertension, dyslipidemia, on chronic steroids for polymyalgia rheumatica comes in with the above-noted complaints. His note that this patient's history goes back to approximately one month ago when he started having left leg pain and presented to the ED at a different hospital where he was told he might have had a small hematoma, Doppler ultrasound reportedly during that visit was negative for DVT. The patient's wife claims that over the past one month, the left leg was becoming more erythematous. They have been seen by the primary care practitioner for this, and was thought to have cellulitis, he was given almost 10 days of antibiotics. Patient's wife claims that previously the leg was much more erythematous compared to what it was today. He was again seen by his primary care practitioner today, the leg was noted to be less erythematous but much more swollen and tense and as a result he was referred as a direct admission to [facility]. They have not been any recent fevers, however approximately 2 weeks ago patient's wife does state that the patient had a fever as high as 103F. There is no history of chest pain or shortness of breath. Patient denies any headache. Patient denies any abdominal pain nausea or vomiting.    ALLERGIES:   Allergies  Allergen Reactions  . Atorvastatin     REACTION: sore legs    PAST MEDICAL HISTORY: Past Medical History  Diagnosis Date  . DM 06/20/2009  . HYPERLIPIDEMIA 06/20/2009  . HYPERTENSION 06/20/2009  . CORONARY ATHEROSCLEROSIS NATIVE CORONARY ARTERY  01/27/2011    s/p PCI 2/12  . Herpes zoster ophthalmicus   . Arthritis   . Obesity   . GERD (gastroesophageal reflux disease)   . Ischemic cardiomyopathy 2/12    EF 31%  . Adenocarcinoma of prostate     s/p seed implants  . Polymyalgia rheumatica     PAST SURGICAL HISTORY: Past Surgical History  Procedure Date  . Prostate surgery     cancer, seed implant  . Coronary stent placement     reports 6 stents placed    MEDICATIONS AT HOME: Prior to Admission medications   Medication Sig Start Date End Date Taking? Authorizing Provider  aspirin EC 81 MG tablet Take 81 mg by mouth daily.   Yes Historical Provider, MD  carvedilol (COREG) 12.5 MG tablet Take 18.75 mg by mouth 2 (two) times daily with a meal.   Yes Historical Provider, MD  clindamycin (CLEOCIN) 150 MG capsule Take 150 mg by mouth 3 (three) times daily.  04/18/12  Yes Historical Provider, MD  Coenzyme Q10 (COQ-10) 200 MG CAPS Take 1 capsule by mouth daily.   Yes Historical Provider, MD  digoxin (LANOXIN) 0.125 MG tablet Take 125 mcg by mouth daily.   Yes Historical Provider, MD  furosemide (LASIX) 20 MG tablet Take 20 mg by mouth as needed. For swelling. 03/09/12  Yes Laurey Morale, MD  glimepiride (AMARYL) 4 MG tablet Take 1 tablet (4 mg total) by mouth 2 (two) times daily. 01/27/12 01/26/13 Yes Kristian Covey, MD  HYDROcodone-acetaminophen (NORCO) 10-325 MG per tablet Take 1  tablet by mouth every 6 (six) hours as needed. For pain. 04/15/12 04/25/12 Yes Kristian Covey, MD  lisinopril (PRINIVIL,ZESTRIL) 10 MG tablet Take 10 mg by mouth daily.  03/30/12  Yes Historical Provider, MD  metFORMIN (GLUCOPHAGE) 500 MG tablet Take 1 tablet (500 mg total) by mouth 2 (two) times daily with a meal. 03/17/12 03/17/13 Yes Kristian Covey, MD  Multiple Vitamin (MULITIVITAMIN WITH MINERALS) TABS Take 1 tablet by mouth daily.   Yes Historical Provider, MD  nitroGLYCERIN (NITROSTAT) 0.4 MG SL tablet Place 0.4 mg under the tongue every 5 (five)  minutes as needed. For chest pain.   Yes Historical Provider, MD  prasugrel (EFFIENT) 10 MG TABS Take 1 tablet (10 mg total) by mouth daily. 12/30/11  Yes Laurey Morale, MD  pravastatin (PRAVACHOL) 80 MG tablet Take 1 tablet (80 mg total) by mouth every evening. 01/26/12 01/25/13 Yes Laurey Morale, MD  predniSONE (DELTASONE) 1 MG tablet Take 6 mg by mouth daily.    Yes Historical Provider, MD  spironolactone (ALDACTONE) 25 MG tablet Take 1 tablet (25 mg total) by mouth daily. 01/07/12  Yes Laurey Morale, MD  Tamsulosin HCl (FLOMAX) 0.4 MG CAPS Take 0.4 mg by mouth daily.  04/16/12  Yes Historical Provider, MD    FAMILY HISTORY: Family History  Problem Relation Age of Onset  . Cancer Mother 68    unknown CA  . Heart disease Father 40    MI, died  . Alcohol abuse Father     SOCIAL HISTORY:  reports that he quit smoking about 23 years ago. His smoking use included Cigarettes. He has a 6 pack-year smoking history. He has never used smokeless tobacco. He reports that he does not drink alcohol or use illicit drugs.  REVIEW OF SYSTEMS:  Constitutional:   No  weight loss, night sweats,  Fevers, chills, fatigue.  HEENT:    No headaches, Difficulty swallowing,Tooth/dental problems,Sore throat,  No sneezing, itching, ear ache, nasal congestion, post nasal drip,   Cardio-vascular: No chest pain,  Orthopnea, PND, swelling in lower extremities, anasarca, dizziness, palpitations  GI:  No heartburn, indigestion, abdominal pain, nausea, vomiting, diarrhea, change in bowel habits, loss of appetite  Resp: No shortness of breath with exertion or at rest.  No excess mucus, no productive cough, No non-productive cough,  No coughing up of blood.No change in color of mucus.No wheezing.No chest wall deformity  Skin:  no rash or lesions.  GU:  no dysuria, change in color of urine, no urgency or frequency.  No flank pain.  Musculoskeletal: No joint pain or swelling.  No decreased range of motion.  No back pain.  Psych: No change in mood or affect. No depression or anxiety.  No memory loss.   PHYSICAL EXAM: Blood pressure 127/75, pulse 101, temperature 97.6 F (36.4 C), temperature source Oral, resp. rate 20, SpO2 94.00%.  General appearance :Awake, alert, not in any distress. Speech Clear. Not toxic Looking HEENT: Atraumatic and Normocephalic, pupils equally reactive to light and accomodation Neck: supple, no JVD. No cervical lymphadenopathy.  Chest:Good air entry bilaterally, no added sounds  CVS: S1 S2 regular, no murmurs.  Abdomen: Bowel sounds present, Non tender and not distended with no gaurding, rigidity or rebound. Extremities: The left lower leg is swollen, there is some streaky erythema all over. The calf area is particularly more swollen and much tense compared to the rest of the leg. He has adequate sensation. Dorsalis pedis pulse is now able and there  is good capillary refill. His right leg is within normal limits. Neurology: Awake alert, and oriented X 3, CN II-XII intact, Non focal, Deep Tendon Reflex-2+ all over, plantar's downgoing B/L, sensory exam is grossly intact.  Skin:No Rash Wounds:N/A  LABS ON ADMISSION:   Basename 04/22/12 1410  NA 138  K 3.9  CL 99  CO2 28  GLUCOSE 290*  BUN 11  CREATININE 0.83  CALCIUM 9.2  MG --  PHOS --    Basename 04/22/12 1410  AST 29  ALT 44  ALKPHOS 91  BILITOT 0.2*  PROT 6.3  ALBUMIN 2.3*   No results found for this basename: LIPASE:2,AMYLASE:2 in the last 72 hours  Basename 04/22/12 1410  WBC 10.8*  NEUTROABS 8.4*  HGB 10.8*  HCT 34.2*  MCV 88.4  PLT 482*   No results found for this basename: CKTOTAL:3,CKMB:3,CKMBINDEX:3,TROPONINI:3 in the last 72 hours No results found for this basename: DDIMER:2 in the last 72 hours No components found with this basename: POCBNP:3   RADIOLOGIC STUDIES ON ADMISSION: Dg Chest 2 View  04/11/2012  *RADIOLOGY REPORT*  Clinical Data: Chest and nose congestion with  body chills.  Night sweats.  CHEST - 2 VIEW  Comparison: 04/06/2012 and 01/17/2011 radiographs.  Findings: The heart size and mediastinal contours are stable. Coronary artery stents are noted.  There is stable biapical pleural calcification.  There is stable bibasilar scarring.  There is an embolized prostate brachytherapy seed within the left lower lobe which appears unchanged.  No airspace disease or pleural effusion is identified.  There are no acute osseous findings.  IMPRESSION: Stable examination.  No acute cardiopulmonary process.  Original Report Authenticated By: Gerrianne Scale, M.D.   Dg Chest 2 View  04/06/2012  *RADIOLOGY REPORT*  Clinical Data: Shortness of breath.  Chest pain.  Hypertension.  CHEST - 2 VIEW  Comparison: 01/17/2011  Findings: Mild scarring again seen in the lung bases.  Lungs otherwise clear.  No evidence of pulmonary infiltrate or edema.  No evidence of pleural effusion.  Heart size is within normal limits. No mass or lymphadenopathy identified.  IMPRESSION: Mild bibasilar scarring.  No active disease.  Original Report Authenticated By: Danae Orleans, M.D.    ASSESSMENT AND PLAN: Present on Admission:  .Leg leg swelling -I'm not very convinced he actually has cellulitis, I am concerned that he may have a expanding hematoma or even a ruptured Baker's cyst. I did order a MRI, however unfortunately there is some technical issues with the MRI machine, so I have actually ordered a CT of the patient's left lower extremity to differentiate whether this is infectious or a hematoma. A lower extremity venous Doppler has been reorder as well. -I. will on empirically start him on intravenous vancomycin, I have already spoken with Dr. Lajoyce Corners, orthopedic on-call and I have asked him to evaluate the patient's leg. I will keep the patient n.p.o. for now and hold all of his antiplatelet agents. -Further plan will depend upon the CT scan of the leg results and orthopedic  evaluation.  .Polymyalgia rheumatica -Continue with prednisone   .Systolic CHF, chronic -Clinically compensated - Per last echocardiogram done on 01/21/12 he has now improved to 45%. -Continue with Coreg, digoxin and lisinopril. Continue with low-dose Lasix for now.   Marland KitchenHYPERTENSION -Currently controlled with lisinopril and Coreg   .HYPERLIPIDEMIA -Continue with statin  .DM -Hold oral hypoglycemic medications and metformin for now  -Place on sliding scale   .CORONARY ATHEROSCLEROSIS NATIVE CORONARY ARTERY -Hold aspirin and  Effient-given the potential for expanding hematoma, resume depending on patient's clinical course   .ADENOCARCINOMA, PROSTATE -Has prostate radioactive seeds in place, he will need outpatient followup - he recently clot colic and resultant urinary retention, required [facility] just last week.  -Continue with Flomax   Further plan will depend as patient's clinical course evolves and further radiologic and laboratory data become available. Patient will be monitored closely.   DVT Prophylaxis: -Hold heparin/Lovenox/SCDs for now   Code Status:  full code  Total time spent for admission equals 45 minutes.  Jeoffrey Massed 04/22/2012, 4:57 PM

## 2012-04-22 NOTE — Progress Notes (Signed)
Bilateral lower extremity venous duplex completed.  Preliminary report is negative for DVT or SVT bilaterally.  There appears to be a ruptured Baker's cyst in the left pop fossa and proximal calf.

## 2012-04-22 NOTE — Consult Note (Signed)
Reason for Consult: Redness swelling left leg Referring Physician: Josia Cueva is an 71 y.o. male.  HPI: Patient is a 71 year old gentleman who has had swelling in the left calf for over 1 month. He initially had an ultrasound on 03/24/2012 and a fluid collection identified in the popliteal fossa. This was repeated and also showed a fluid collection in the popliteal fossa. Patient presents at this time complaining of pain and swelling in the popliteal fossa. Patient's wife states that the leg actually looks better with better color less redness.  Past Medical History  Diagnosis Date  . DM 06/20/2009  . HYPERLIPIDEMIA 06/20/2009  . HYPERTENSION 06/20/2009  . CORONARY ATHEROSCLEROSIS NATIVE CORONARY ARTERY 01/27/2011    s/p PCI 2/12  . Herpes zoster ophthalmicus   . Arthritis   . Obesity   . GERD (gastroesophageal reflux disease)   . Ischemic cardiomyopathy 2/12    EF 31%  . Adenocarcinoma of prostate     s/p seed implants  . Polymyalgia rheumatica     Past Surgical History  Procedure Date  . Prostate surgery     cancer, seed implant  . Coronary stent placement     reports 6 stents placed    Family History  Problem Relation Age of Onset  . Cancer Mother 56    unknown CA  . Heart disease Father 62    MI, died  . Alcohol abuse Father     Social History:  reports that he quit smoking about 23 years ago. His smoking use included Cigarettes. He has a 6 pack-year smoking history. He has never used smokeless tobacco. He reports that he does not drink alcohol or use illicit drugs.  Allergies:  Allergies  Allergen Reactions  . Atorvastatin     REACTION: sore legs    Medications: I have reviewed the patient's current medications.  Results for orders placed during the hospital encounter of 04/22/12 (from the past 48 hour(s))  CBC     Status: Abnormal   Collection Time   04/22/12  2:10 PM      Component Value Range Comment   WBC 10.8 (*) 4.0 - 10.5 (K/uL)    RBC 3.87 (*)  4.22 - 5.81 (MIL/uL)    Hemoglobin 10.8 (*) 13.0 - 17.0 (g/dL)    HCT 28.4 (*) 13.2 - 52.0 (%)    MCV 88.4  78.0 - 100.0 (fL)    MCH 27.9  26.0 - 34.0 (pg)    MCHC 31.6  30.0 - 36.0 (g/dL)    RDW 44.0  10.2 - 72.5 (%)    Platelets 482 (*) 150 - 400 (K/uL)   DIFFERENTIAL     Status: Abnormal   Collection Time   04/22/12  2:10 PM      Component Value Range Comment   Neutrophils Relative 78 (*) 43 - 77 (%)    Neutro Abs 8.4 (*) 1.7 - 7.7 (K/uL)    Lymphocytes Relative 14  12 - 46 (%)    Lymphs Abs 1.5  0.7 - 4.0 (K/uL)    Monocytes Relative 7  3 - 12 (%)    Monocytes Absolute 0.7  0.1 - 1.0 (K/uL)    Eosinophils Relative 2  0 - 5 (%)    Eosinophils Absolute 0.2  0.0 - 0.7 (K/uL)    Basophils Relative 0  0 - 1 (%)    Basophils Absolute 0.0  0.0 - 0.1 (K/uL)   COMPREHENSIVE METABOLIC PANEL     Status:  Abnormal   Collection Time   04/22/12  2:10 PM      Component Value Range Comment   Sodium 138  135 - 145 (mEq/L)    Potassium 3.9  3.5 - 5.1 (mEq/L)    Chloride 99  96 - 112 (mEq/L)    CO2 28  19 - 32 (mEq/L)    Glucose, Bld 290 (*) 70 - 99 (mg/dL)    BUN 11  6 - 23 (mg/dL)    Creatinine, Ser 4.09  0.50 - 1.35 (mg/dL)    Calcium 9.2  8.4 - 10.5 (mg/dL)    Total Protein 6.3  6.0 - 8.3 (g/dL)    Albumin 2.3 (*) 3.5 - 5.2 (g/dL)    AST 29  0 - 37 (U/L)    ALT 44  0 - 53 (U/L)    Alkaline Phosphatase 91  39 - 117 (U/L)    Total Bilirubin 0.2 (*) 0.3 - 1.2 (mg/dL)    GFR calc non Af Amer 88 (*) >90 (mL/min)    GFR calc Af Amer >90  >90 (mL/min)   PROTIME-INR     Status: Normal   Collection Time   04/22/12  2:10 PM      Component Value Range Comment   Prothrombin Time 13.8  11.6 - 15.2 (seconds)    INR 1.04  0.00 - 1.49      Ct Tibia Fibula Left W Contrast  04/22/2012  *RADIOLOGY REPORT*  Clinical Data: Pain and swelling in the left tibia and fibula for 6 weeks.  Hematoma versus abscess.  CT OF THE LEFT TIBIA AND FIBULA WITH CONTRAST  Contrast: OMNIPAQUE IOHEXOL 300 MG/ML  SOLN   Comparison: None.  Findings: There is a peripherally enhancing septated fluid collection in the medial popliteal fossa that extends inferiorly into the posterior compartment of the leg. The collection is interposed between the medial head of the gastrocnemius and the soleus in the leg.  Long axis measurement is 26 cm on sagittal imaging.  In the popliteal region, this has multiple septations which enhance.  There is no gas identified within the lesion. Inferiorly, there is high attenuation material within the collection, which is favored to represent retracted clot rather than enhancing tissue.  There is significant mass effect.  Largest axial dimensions are 9 cm transverse by 5.5 cm AP (image 88 series 7).  The tibia and fibula appear within normal limits.  Flexor and extensor tendons of the ankle appear normal.  Accessory navicular bone incidentally noted.  The Achilles tendon appears intact.  IMPRESSION: Large peripheral enhancing fluid collection extending from popliteal fossa into the posterior compartment of the leg deep to the medial head of the gastrocnemius.  This does not have aggressive/invasive features.  This is most compatible with liquefied hematoma.  Superimposed infection cannot be excluded but is not favored. Soft tissue neoplasm is unlikely.  This probably represents either rupture of the plantaris with subsequent hemorrhage or a hemorrhagic Baker's cyst that extended into the leg.  Original Report Authenticated By: Andreas Newport, M.D.    Review of Systems  All other systems reviewed and are negative.   Blood pressure 127/75, pulse 101, temperature 97.6 F (36.4 C), temperature source Oral, resp. rate 20, SpO2 94.00%. Physical Exam On examination patient does have palpable dorsalis pedis pulse. He can actively plantar flex and dorsiflex the ankle and can actively plantarflex and dorsiflex his toes. He does have tightness and firmness to palpation in the popliteal fossa does have the  purple green and yellow discoloration in the popliteal fossa and posterior calf consistent with a resolving hematoma. Review of the CT scan does show a large hematoma extending from the popliteal fossa posteriorly into the calf. Assessment/Plan: Assessment: Resolving large hematoma extending from the popliteal fossa to the posterior aspect of the calf.  Plan discussed with the patient and his wife that this is a difficult problem. Due to the patient's blood thinners he is having recurrent bleeding into this area. Patient is at risk of a compartment syndrome and due to the fact that he is having swelling for over a month patient may have some muscle ischemic and necrotic changes which are chronic in nature at this time. Surgical debridement would place the patient at significant risk  of infection with the chronic muscle damage from this hematoma.  Recommend warm heating pad elevation discontinuing blood thinners I will follow.  Anthonny Schiller V 04/22/2012, 7:28 PM

## 2012-04-22 NOTE — Progress Notes (Signed)
Subjective:    Patient ID: Wayne [name], male    DOB: 07-08-1941, 71 y.o.   MRN: [id number]  HPI  Patient is seen with persistent left leg pain, warmth, erythema, and edema. He is being treated for cellulitis. Refer to prior notes. In early April he had apparently an injury while working outside with question of pulled muscle. Went to emergency department and had venous Dopplers that were negative and reported hematoma. Had repeat Dopplers here in [facility] emergency department 04/06/2012 which again revealed no evidence for DVT and hematoma proximal medial calf. Patient was subsequently seen again on 04/11/2012 with pain and swelling and was given pain medication. He was seen in our office on 04/13/2012 with fever 101 and chills and noted to have cellulitis. Start Keflex 500 mg 3 times a day. Systemic fevers did resolve but has had persistent swelling pain redness and warmth since then.  Patient unable to ambulate over the past couple of weeks. Pain is moderate to severe. Frequently elevating. Wife has been using heating pad frequently.  Went to urology for urinary retention and had dilatation procedure last week. Was briefly admitted at [facility] and was apparently given some type of IV antibiotic and transition from Keflex to Cleocin 150 minutes 3 times a day. This was started on 04/18/2012. He has not seen any improvement legs since starting clindamycin. No prior history of MRSA. No positive cultures. No open skin wounds. Denies any systemic fever at this time. Still has occasional diaphoresis. No nausea or vomiting.  Multiple chronic problems including past history of adenocarcinoma prostate, type 2 diabetes, hyperlipidemia, hypertension, cardiomyopathy, CAD, polymyalgia rheumatica, obstructive sleep apnea, and systolic heart failure. Polymyalgia followed by rheumatology and maintained on low-dose prednisone.  Patient has new problem of rash left groin region which appeared yesterday. Slightly  pruritic.  Past Medical History  Diagnosis Date  . DM 06/20/2009  . HYPERLIPIDEMIA 06/20/2009  . HYPERTENSION 06/20/2009  . CORONARY ATHEROSCLEROSIS NATIVE CORONARY ARTERY 01/27/2011    s/p PCI 2/12  . Herpes zoster ophthalmicus   . Arthritis   . Obesity   . GERD (gastroesophageal reflux disease)   . Ischemic cardiomyopathy 2/12    EF 31%  . Adenocarcinoma of prostate     s/p seed implants  . Polymyalgia rheumatica    Past Surgical History  Procedure Date  . Prostate surgery     cancer, seed implant  . Coronary stent placement     reports 6 stents placed    reports that he quit smoking about 23 years ago. His smoking use included Cigarettes. He has a 6 pack-year smoking history. He has never used smokeless tobacco. He reports that he does not drink alcohol or use illicit drugs. family history includes Alcohol abuse in his father; Cancer (age of onset:80) in his mother; and Heart disease (age of onset:61) in his father. Allergies  Allergen Reactions  . Atorvastatin     REACTION: sore legs      Review of Systems  Constitutional: Positive for fatigue. Negative for fever and appetite change.  Respiratory: Negative for cough.   Cardiovascular: Positive for leg swelling. Negative for chest pain and palpitations.  Gastrointestinal: Negative for abdominal pain.  Genitourinary: Negative for dysuria.  Skin: Positive for rash.  Neurological: Negative for dizziness and syncope.       Objective:   Physical Exam  Constitutional: He is oriented to person, place, and time. He appears well-developed and well-nourished.  HENT:  Right Ear: External ear normal.  Left Ear: External ear normal.  Mouth/Throat: Oropharynx is clear and moist.  Neck: Neck supple. No thyromegaly present.  Cardiovascular: Normal rate and regular rhythm.   Pulmonary/Chest: Effort normal and breath sounds normal. No respiratory distress. He has no wheezes. He has no rales.  Musculoskeletal: He exhibits edema.        Left leg reveals warm to touch and splotchy redness which blanches with pressure. No wounds or ulcerations. He has moderate to severe swelling of the left calf region  Neurological: He is alert and oriented to person, place, and time.  Skin:       Patient has diffuse erythema well marginated left inguinal region          Assessment & Plan:  #1 left leg cellulitis in patient with previous Dopplers negative for DVT and showing hematoma related to recent injury. Cellulitis changes have been slow to resolve.  Treated initially with Keflex and more recently clindamycin. We discussed hospitalization for further assessment and treatment and family requesting this today.  No known history of MRSA #2 probable tinia cruris left groin. #3 history polymyalgia rheumatica treated with prednisone #4 type 2 diabetes  #5 history of CAD with systolic heart failure #6 history of obstructive sleep apnea  #7 history of adenocarcinoma prostate #8 history of hypertension  #9 history of hyperlipidemia

## 2012-04-22 NOTE — Progress Notes (Signed)
CT results reveiwed, tried to reach Dr Lajoyce Corners, but he is scrubbed in the OR, left a message for him with the OR staff. Patient NPO till seen by Dr Lajoyce Corners.I have left a message in his voicemail as well.

## 2012-04-23 DIAGNOSIS — IMO0001 Reserved for inherently not codable concepts without codable children: Secondary | ICD-10-CM

## 2012-04-23 DIAGNOSIS — E1165 Type 2 diabetes mellitus with hyperglycemia: Secondary | ICD-10-CM

## 2012-04-23 DIAGNOSIS — I10 Essential (primary) hypertension: Secondary | ICD-10-CM

## 2012-04-23 DIAGNOSIS — M7981 Nontraumatic hematoma of soft tissue: Secondary | ICD-10-CM

## 2012-04-23 DIAGNOSIS — I509 Heart failure, unspecified: Secondary | ICD-10-CM

## 2012-04-23 LAB — COMPREHENSIVE METABOLIC PANEL
ALT: 43 U/L (ref 0–53)
AST: 27 U/L (ref 0–37)
Albumin: 2.1 g/dL — ABNORMAL LOW (ref 3.5–5.2)
Calcium: 9 mg/dL (ref 8.4–10.5)
Sodium: 138 mEq/L (ref 135–145)
Total Protein: 6 g/dL (ref 6.0–8.3)

## 2012-04-23 LAB — GLUCOSE, CAPILLARY
Glucose-Capillary: 235 mg/dL — ABNORMAL HIGH (ref 70–99)
Glucose-Capillary: 271 mg/dL — ABNORMAL HIGH (ref 70–99)

## 2012-04-23 LAB — CBC
MCH: 27.7 pg (ref 26.0–34.0)
MCHC: 31.1 g/dL (ref 30.0–36.0)
Platelets: 434 10*3/uL — ABNORMAL HIGH (ref 150–400)
RBC: 3.76 MIL/uL — ABNORMAL LOW (ref 4.22–5.81)
RDW: 13.7 % (ref 11.5–15.5)

## 2012-04-23 MED ORDER — INSULIN GLARGINE 100 UNIT/ML ~~LOC~~ SOLN
12.0000 [IU] | Freq: Every day | SUBCUTANEOUS | Status: DC
  Administered 2012-04-23: 12 [IU] via SUBCUTANEOUS

## 2012-04-23 MED ORDER — INSULIN ASPART 100 UNIT/ML ~~LOC~~ SOLN
3.0000 [IU] | Freq: Three times a day (TID) | SUBCUTANEOUS | Status: DC
  Administered 2012-04-24: 3 [IU] via SUBCUTANEOUS

## 2012-04-23 MED ORDER — NYSTATIN 100000 UNIT/GM EX POWD
Freq: Three times a day (TID) | CUTANEOUS | Status: DC
  Administered 2012-04-23 – 2012-04-25 (×4): via TOPICAL
  Filled 2012-04-23: qty 15

## 2012-04-23 NOTE — Progress Notes (Signed)
Inpatient Diabetes Program Recommendations  AACE/ADA: New Consensus Statement on Inpatient Glycemic Control (2009)  Target Ranges:  Prepandial:   less than 140 mg/dL      Peak postprandial:   less than 180 mg/dL (1-2 hours)      Critically ill patients:  140 - 180 mg/dL   Reason for Visit: Results for Wayne [name], Wayne [name] (MRN [id number]) as of 04/23/2012 08:01  Ref. Range 04/17/2012 13:48 04/17/2012 15:25 04/22/2012 22:04 04/23/2012 07:41  Glucose-Capillary Latest Range: 70-99 mg/dL 147 (H) 829 (H) 562 (H) 235 (H)    Inpatient Diabetes Program Recommendations Insulin - Basal: Please add Lantus 20 units daily. HgbA1C: A1C=11.4% indicating poor glycemic control.  Will likely need insulin at discharge.

## 2012-04-23 NOTE — Progress Notes (Addendum)
Patient Demographics  Wayne [name], is a 71 y.o. male  YQM:[id number]  BMW:[id number]  DOB - 20-Jun-1941  Admit date - 04/22/2012  Admitting Physician Dewayne Shorter Levora Dredge, MD  Outpatient Primary MD for the patient is Kristian Covey, MD, MD  LOS - 1     No chief complaint on file.       Subjective:   Wayne [name] today has, No headache, No chest pain, No abdominal pain - No Nausea, No new weakness tingling or numbness, No Cough - SOB. Use his left leg is less swollen and painful.  Objective:   Filed Vitals:   04/22/12 1251 04/22/12 2016 04/22/12 2206 04/23/12 0550  BP: 127/75 118/69 122/60 116/61  Pulse: 101 95 87 93  Temp: 97.6 F (36.4 C) 99 F (37.2 C) 99.8 F (37.7 C) 98.7 F (37.1 C)  TempSrc: Oral Oral Oral Oral  Resp: 20 18 18 18   SpO2: 94% 93% 92% 93%    Wt Readings from Last 3 Encounters:  04/17/12 110.1 kg (242 lb 11.6 oz)  04/11/12 108.863 kg (240 lb)  04/06/12 111.585 kg (246 lb)     Intake/Output Summary (Last 24 hours) at 04/23/12 1015 Last data filed at 04/23/12 0807  Gross per 24 hour  Intake    764 ml  Output    550 ml  Net    214 ml    Exam Awake Alert, Oriented *3, No new F.N deficits, Normal affect [place].AT,PERRAL Supple Neck,No JVD, No cervical lymphadenopathy appriciated.  Symmetrical Chest wall movement, Good air movement bilaterally, CTAB RRR,No Gallops,Rubs or new Murmurs, No Parasternal Heave +ve B.Sounds, Abd Soft, Non tender, No organomegaly appriciated, No rebound -guarding or rigidity. No Cyanosis, Clubbing or edema, No new Rash or bruise, left calf much bigger in size than the right, obvious swelling, appears to be firm, somewhat decreased sensation in left foot and there is some discomfort or mild pain with left toe extension.  Data Review  CBC  Lab 04/23/12 0550 04/22/12 1410 04/17/12 1150  WBC 10.6* 10.8* 13.7*  HGB 10.4* 10.8* 11.0*  HCT 33.4* 34.2* 34.1*  PLT 434* 482* 300  MCV 88.8 88.4 87.2  MCH 27.7 27.9 28.1    MCHC 31.1 31.6 32.3  RDW 13.7 13.7 13.3  LYMPHSABS -- 1.5 1.1  MONOABS -- 0.7 1.2*  EOSABS -- 0.2 0.1  BASOSABS -- 0.0 0.0  BANDABS -- -- --    Chemistries   Lab 04/23/12 0550 04/22/12 1410 04/17/12 1150  NA 138 138 132*  K 4.4 3.9 4.4  CL 100 99 94*  CO2 29 28 25   GLUCOSE 232* 290* 381*  BUN 11 11 12   CREATININE 0.88 0.83 0.82  CALCIUM 9.0 9.2 9.1  MG -- -- --  AST 27 29 --  ALT 43 44 --  ALKPHOS 86 91 --  BILITOT 0.3 0.2* --   ------------------------------------------------------------------------------------------------------------------ CrCl is unknown because both a height and weight (above a minimum accepted value) are required for this calculation. ------------------------------------------------------------------------------------------------------------------ No results found for this basename: HGBA1C:2 in the last 72 hours ------------------------------------------------------------------------------------------------------------------ No results found for this basename: CHOL:2,HDL:2,LDLCALC:2,TRIG:2,CHOLHDL:2,LDLDIRECT:2 in the last 72 hours ------------------------------------------------------------------------------------------------------------------ No results found for this basename: TSH,T4TOTAL,FREET3,T3FREE,THYROIDAB in the last 72 hours ------------------------------------------------------------------------------------------------------------------ No results found for this basename: VITAMINB12:2,FOLATE:2,FERRITIN:2,TIBC:2,IRON:2,RETICCTPCT:2 in the last 72 hours  Coagulation profile  Lab 04/22/12 1410 04/17/12 1150  INR 1.04 1.17  PROTIME -- --    No results found for this basename: DDIMER:2 in the last 72 hours  Cardiac Enzymes No  results found for this basename: CK:3,CKMB:3,TROPONINI:3,MYOGLOBIN:3 in the last 168 hours ------------------------------------------------------------------------------------------------------------------ No  components found with this basename: POCBNP:3  Micro Results Recent Results (from the past 240 hour(s))  URINE CULTURE     Status: Normal   Collection Time   04/17/12 12:07 PM      Component Value Range Status Comment   Specimen Description URINE, CATHETERIZED   Final    Special Requests NONE   Final    Culture  Setup Time [id number]   Final    Colony Count NO GROWTH   Final    Culture NO GROWTH   Final    Report Status 04/18/2012 FINAL   Final     Radiology Reports Dg Chest 2 View  04/11/2012  *RADIOLOGY REPORT*  Clinical Data: Chest and nose congestion with body chills.  Night sweats.  CHEST - 2 VIEW  Comparison: 04/06/2012 and 01/17/2011 radiographs.  Findings: The heart size and mediastinal contours are stable. Coronary artery stents are noted.  There is stable biapical pleural calcification.  There is stable bibasilar scarring.  There is an embolized prostate brachytherapy seed within the left lower lobe which appears unchanged.  No airspace disease or pleural effusion is identified.  There are no acute osseous findings.  IMPRESSION: Stable examination.  No acute cardiopulmonary process.  Original Report Authenticated By: Gerrianne Scale, M.D.   Dg Chest 2 View  04/06/2012  *RADIOLOGY REPORT*  Clinical Data: Shortness of breath.  Chest pain.  Hypertension.  CHEST - 2 VIEW  Comparison: 01/17/2011  Findings: Mild scarring again seen in the lung bases.  Lungs otherwise clear.  No evidence of pulmonary infiltrate or edema.  No evidence of pleural effusion.  Heart size is within normal limits. No mass or lymphadenopathy identified.  IMPRESSION: Mild bibasilar scarring.  No active disease.  Original Report Authenticated By: Danae Orleans, M.D.   Ct Tibia Fibula Left W Contrast  04/22/2012  *RADIOLOGY REPORT*  Clinical Data: Pain and swelling in the left tibia and fibula for 6 weeks.  Hematoma versus abscess.  CT OF THE LEFT TIBIA AND FIBULA WITH CONTRAST  Contrast: OMNIPAQUE IOHEXOL 300  MG/ML  SOLN  Comparison: None.  Findings: There is a peripherally enhancing septated fluid collection in the medial popliteal fossa that extends inferiorly into the posterior compartment of the leg. The collection is interposed between the medial head of the gastrocnemius and the soleus in the leg.  Long axis measurement is 26 cm on sagittal imaging.  In the popliteal region, this has multiple septations which enhance.  There is no gas identified within the lesion. Inferiorly, there is high attenuation material within the collection, which is favored to represent retracted clot rather than enhancing tissue.  There is significant mass effect.  Largest axial dimensions are 9 cm transverse by 5.5 cm AP (image 88 series 7).  The tibia and fibula appear within normal limits.  Flexor and extensor tendons of the ankle appear normal.  Accessory navicular bone incidentally noted.  The Achilles tendon appears intact.  IMPRESSION: Large peripheral enhancing fluid collection extending from popliteal fossa into the posterior compartment of the leg deep to the medial head of the gastrocnemius.  This does not have aggressive/invasive features.  This is most compatible with liquefied hematoma.  Superimposed infection cannot be excluded but is not favored. Soft tissue neoplasm is unlikely.  This probably represents either rupture of the plantaris with subsequent hemorrhage or a hemorrhagic Baker's cyst that extended into the leg.  Original Report Authenticated By: Andreas Newport, M.D.    Scheduled Meds:   . carvedilol  18.75 mg Oral BID WC  . digoxin  125 mcg Oral Daily  . furosemide  20 mg Oral Daily  . insulin aspart  0-9 Units Subcutaneous TID WC  . lisinopril  10 mg Oral Daily  . mulitivitamin with minerals  1 tablet Oral Daily  . predniSONE  6 mg Oral Daily  . senna  1 tablet Oral BID  . simvastatin  40 mg Oral q1800  . Tamsulosin HCl  0.4 mg Oral Daily  . vancomycin  2,250 mg Intravenous Once  . vancomycin   1,000 mg Intravenous Q12H   Continuous Infusions:   . sodium chloride 50 mL/hr at 04/23/12 0752   PRN Meds:.acetaminophen, acetaminophen, albuterol, alum & mag hydroxide-simeth, iohexol, morphine injection, nitroGLYCERIN, ondansetron (ZOFRAN) IV, ondansetron, oxyCODONE  Assessment & Plan   1. Leg leg swelling  From liquid 5 hematoma in the left calf space, patient's antiplatelet medication has been held, orthopedics physician Dr. Lajoyce Corners is following the patient, we'll continue to keep the leg elevated, monitor closely for compartment syndrome, CK levels are stable, have discussed the case with Dr. Nemiah Commander VER who agrees that if bleeding gets worse platelet transfusion can be tried. Ever at this time this seems to have stabilized.     2. History of Polymyalgia rheumatica  -Continue with prednisone     3.Chronic Systolic CHF, chronic  -Clinically compensated  - Per last echocardiogram done on 01/21/12 he has now improved to 45%.  -Continue with Coreg, digoxin and lisinopril. Continue with low-dose Lasix for now.     4. HYPERTENSION  -Currently controlled with lisinopril and Coreg and stable.     5. HYPERLIPIDEMIA  -Continue with statin     6. DM   -Hold oral hypoglycemic medications and metformin for now  -Place on sliding scale Will add low-dose Lantus.  CBG (last 3)   Basename 04/23/12 0741 04/22/12 2204  GLUCAP 235* 258*       7. History of CORONARY ATHEROSCLEROSIS NATIVE CORONARY ARTERY   -Hold aspirin and Effient-given the potential for expanding hematoma, resume depending on patient's clinical course, continue beta blocker ACE inhibitor and statin patient is pain-free no acute issues.    8.ADENOCARCINOMA, PROSTATE  -Has prostate radioactive seeds in place, he will need outpatient followup - he recently clot related urinary retention, required [facility] just last week. Currently stable. We'll continue  Flomax.   9. Fungal rash in left groin. Statin powder 3 times a day and monitor. He the area dry and clean.   DVT Prophylaxis  Lovenox - Heparin - SCDs  will held due to number one problem above      Leroy Sea M.D on 04/23/2012 at 10:15 AM  [facility] Office  [phone]

## 2012-04-23 NOTE — Progress Notes (Signed)
UR COMPLETED  

## 2012-04-24 DIAGNOSIS — IMO0001 Reserved for inherently not codable concepts without codable children: Secondary | ICD-10-CM

## 2012-04-24 DIAGNOSIS — I509 Heart failure, unspecified: Secondary | ICD-10-CM

## 2012-04-24 DIAGNOSIS — E1165 Type 2 diabetes mellitus with hyperglycemia: Secondary | ICD-10-CM

## 2012-04-24 DIAGNOSIS — M7981 Nontraumatic hematoma of soft tissue: Secondary | ICD-10-CM

## 2012-04-24 DIAGNOSIS — I10 Essential (primary) hypertension: Secondary | ICD-10-CM

## 2012-04-24 LAB — GLUCOSE, CAPILLARY
Glucose-Capillary: 300 mg/dL — ABNORMAL HIGH (ref 70–99)
Glucose-Capillary: 255 mg/dL — ABNORMAL HIGH (ref 70–99)
Glucose-Capillary: 315 mg/dL — ABNORMAL HIGH (ref 70–99)

## 2012-04-24 MED ORDER — GLIMEPIRIDE 4 MG PO TABS
4.0000 mg | ORAL_TABLET | Freq: Two times a day (BID) | ORAL | Status: DC
  Administered 2012-04-24 – 2012-04-25 (×3): 4 mg via ORAL
  Filled 2012-04-24 (×6): qty 1

## 2012-04-24 MED ORDER — INSULIN ASPART 100 UNIT/ML ~~LOC~~ SOLN: 5.0000 [IU] | Freq: Three times a day (TID) | SUBCUTANEOUS | Status: DC

## 2012-04-24 MED ORDER — FUROSEMIDE 40 MG PO TABS
40.0000 mg | ORAL_TABLET | Freq: Once | ORAL | Status: DC
  Filled 2012-04-24: qty 1

## 2012-04-24 MED ORDER — INSULIN GLARGINE 100 UNIT/ML ~~LOC~~ SOLN
20.0000 [IU] | Freq: Every day | SUBCUTANEOUS | Status: DC
  Administered 2012-04-24: 11:00:00 via SUBCUTANEOUS
  Administered 2012-04-25: 20 [IU] via SUBCUTANEOUS

## 2012-04-24 NOTE — Progress Notes (Signed)
Patient Demographics  Wayne [name], is a 71 y.o. male  WUJ:[id number]  [id number]  DOB - 12/08/1941  Admit date - 04/22/2012  Admitting Physician Dewayne Shorter Levora Dredge, MD  Outpatient Primary MD for the patient is Kristian Covey, MD, MD  LOS - 2     No chief complaint on file.       Subjective:   Wayne [name] today has, No headache, No chest pain, No abdominal pain - No Nausea, No new weakness tingling or numbness, No Cough - SOB. Use his left leg is less swollen and painful.  Objective:   Filed Vitals:   04/23/12 1350 04/23/12 2123 04/24/12 0613 04/24/12 1032  BP: 123/62 121/61 115/57 131/71  Pulse: 86 82 85 85  Temp: 98.1 F (36.7 C) 98.2 F (36.8 C) 98 F (36.7 C)   TempSrc:  Oral Oral   Resp: 20 20 18    SpO2: 96% 94% 97%     Wt Readings from Last 3 Encounters:  04/17/12 110.1 kg (242 lb 11.6 oz)  04/11/12 108.863 kg (240 lb)  04/06/12 111.585 kg (246 lb)     Intake/Output Summary (Last 24 hours) at 04/24/12 1119 Last data filed at 04/24/12 4696  Gross per 24 hour  Intake   2000 ml  Output   1402 ml  Net    598 ml    Exam Awake Alert, Oriented *3, No new F.N deficits, Normal affect [place].AT,PERRAL Supple Neck,No JVD, No cervical lymphadenopathy appriciated.  Symmetrical Chest wall movement, Good air movement bilaterally, CTAB RRR,No Gallops,Rubs or new Murmurs, No Parasternal Heave +ve B.Sounds, Abd Soft, Non tender, No organomegaly appriciated, No rebound -guarding or rigidity. No Cyanosis, Clubbing or edema, No new Rash or bruise, left calf much bigger in size than the right, obvious swelling, appears to be firm, somewhat decreased sensation in left foot and there is some discomfort or mild pain with left toe extension.  Data Review  CBC  Lab 04/23/12 0550 04/22/12 1410 04/17/12 1150  WBC 10.6* 10.8* 13.7*  HGB 10.4* 10.8* 11.0*  HCT 33.4* 34.2* 34.1*  PLT 434* 482* 300  MCV 88.8 88.4 87.2  MCH 27.7 27.9 28.1  MCHC 31.1 31.6 32.3  RDW 13.7  13.7 13.3  LYMPHSABS -- 1.5 1.1  MONOABS -- 0.7 1.2*  EOSABS -- 0.2 0.1  BASOSABS -- 0.0 0.0  BANDABS -- -- --    Chemistries   Lab 04/23/12 0550 04/22/12 1410 04/17/12 1150  NA 138 138 132*  K 4.4 3.9 4.4  CL 100 99 94*  CO2 29 28 25   GLUCOSE 232* 290* 381*  BUN 11 11 12   CREATININE 0.88 0.83 0.82  CALCIUM 9.0 9.2 9.1  MG -- -- --  AST 27 29 --  ALT 43 44 --  ALKPHOS 86 91 --  BILITOT 0.3 0.2* --   ------------------------------------------------------------------------------------------------------------------ CrCl is unknown because both a height and weight (above a minimum accepted value) are required for this calculation. ------------------------------------------------------------------------------------------------------------------ No results found for this basename: HGBA1C:2 in the last 72 hours ------------------------------------------------------------------------------------------------------------------ No results found for this basename: CHOL:2,HDL:2,LDLCALC:2,TRIG:2,CHOLHDL:2,LDLDIRECT:2 in the last 72 hours ------------------------------------------------------------------------------------------------------------------ No results found for this basename: TSH,T4TOTAL,FREET3,T3FREE,THYROIDAB in the last 72 hours ------------------------------------------------------------------------------------------------------------------ No results found for this basename: VITAMINB12:2,FOLATE:2,FERRITIN:2,TIBC:2,IRON:2,RETICCTPCT:2 in the last 72 hours  Coagulation profile  Lab 04/22/12 1410 04/17/12 1150  INR 1.04 1.17  PROTIME -- --    No results found for this basename: DDIMER:2 in the last 72 hours  Cardiac Enzymes No results found for this basename: CK:3,CKMB:3,TROPONINI:3,MYOGLOBIN:3  in the last 168 hours ------------------------------------------------------------------------------------------------------------------ No components found with this basename:  POCBNP:3  Micro Results Recent Results (from the past 240 hour(s))  URINE CULTURE     Status: Normal   Collection Time   04/17/12 12:07 PM      Component Value Range Status Comment   Specimen Description URINE, CATHETERIZED   Final    Special Requests NONE   Final    Culture  Setup Time [id number]   Final    Colony Count NO GROWTH   Final    Culture NO GROWTH   Final    Report Status 04/18/2012 FINAL   Final     Radiology Reports Dg Chest 2 View  04/11/2012  *RADIOLOGY REPORT*  Clinical Data: Chest and nose congestion with body chills.  Night sweats.  CHEST - 2 VIEW  Comparison: 04/06/2012 and 01/17/2011 radiographs.  Findings: The heart size and mediastinal contours are stable. Coronary artery stents are noted.  There is stable biapical pleural calcification.  There is stable bibasilar scarring.  There is an embolized prostate brachytherapy seed within the left lower lobe which appears unchanged.  No airspace disease or pleural effusion is identified.  There are no acute osseous findings.  IMPRESSION: Stable examination.  No acute cardiopulmonary process.  Original Report Authenticated By: Gerrianne Scale, M.D.   Dg Chest 2 View  04/06/2012  *RADIOLOGY REPORT*  Clinical Data: Shortness of breath.  Chest pain.  Hypertension.  CHEST - 2 VIEW  Comparison: 01/17/2011  Findings: Mild scarring again seen in the lung bases.  Lungs otherwise clear.  No evidence of pulmonary infiltrate or edema.  No evidence of pleural effusion.  Heart size is within normal limits. No mass or lymphadenopathy identified.  IMPRESSION: Mild bibasilar scarring.  No active disease.  Original Report Authenticated By: Danae Orleans, M.D.   Ct Tibia Fibula Left W Contrast  04/22/2012  *RADIOLOGY REPORT*  Clinical Data: Pain and swelling in the left tibia and fibula for 6 weeks.  Hematoma versus abscess.  CT OF THE LEFT TIBIA AND FIBULA WITH CONTRAST  Contrast: OMNIPAQUE IOHEXOL 300 MG/ML  SOLN  Comparison: None.   Findings: There is a peripherally enhancing septated fluid collection in the medial popliteal fossa that extends inferiorly into the posterior compartment of the leg. The collection is interposed between the medial head of the gastrocnemius and the soleus in the leg.  Long axis measurement is 26 cm on sagittal imaging.  In the popliteal region, this has multiple septations which enhance.  There is no gas identified within the lesion. Inferiorly, there is high attenuation material within the collection, which is favored to represent retracted clot rather than enhancing tissue.  There is significant mass effect.  Largest axial dimensions are 9 cm transverse by 5.5 cm AP (image 88 series 7).  The tibia and fibula appear within normal limits.  Flexor and extensor tendons of the ankle appear normal.  Accessory navicular bone incidentally noted.  The Achilles tendon appears intact.  IMPRESSION: Large peripheral enhancing fluid collection extending from popliteal fossa into the posterior compartment of the leg deep to the medial head of the gastrocnemius.  This does not have aggressive/invasive features.  This is most compatible with liquefied hematoma.  Superimposed infection cannot be excluded but is not favored. Soft tissue neoplasm is unlikely.  This probably represents either rupture of the plantaris with subsequent hemorrhage or a hemorrhagic Baker's cyst that extended into the leg.  Original Report Authenticated By: Andreas Newport,  M.D.    Scheduled Meds:    . carvedilol  18.75 mg Oral BID WC  . digoxin  125 mcg Oral Daily  . furosemide  20 mg Oral Daily  . glimepiride  4 mg Oral BID  . insulin aspart  0-9 Units Subcutaneous TID WC  . insulin glargine  20 Units Subcutaneous Daily  . lisinopril  10 mg Oral Daily  . mulitivitamin with minerals  1 tablet Oral Daily  . nystatin   Topical TID  . predniSONE  6 mg Oral Daily  . senna  1 tablet Oral BID  . simvastatin  40 mg Oral q1800  . Tamsulosin HCl   0.4 mg Oral Daily  . vancomycin  1,000 mg Intravenous Q12H  . DISCONTD: insulin aspart  3 Units Subcutaneous TID WC  . DISCONTD: insulin aspart  5 Units Subcutaneous TID WC  . DISCONTD: insulin glargine  12 Units Subcutaneous Daily   Continuous Infusions:    . DISCONTD: sodium chloride 50 mL/hr at 04/23/12 2200   PRN Meds:.acetaminophen, acetaminophen, albuterol, alum & mag hydroxide-simeth, morphine injection, nitroGLYCERIN, ondansetron (ZOFRAN) IV, ondansetron, oxyCODONE  Assessment & Plan   1. Leg leg swelling  From liquid 5 hematoma in the left calf space, patient's antiplatelet medication has been held, orthopedics physician Dr. Lajoyce Corners is following the patient, we'll continue to keep the leg elevated, monitor closely for compartment syndrome, CK levels are stable, exam improving, D/W Dr Lajoyce Corners no surgery needed, likely AM DC.    2. History of Polymyalgia rheumatica  -Continue with prednisone     3.Chronic Systolic CHF, chronic  -Clinically compensated  - Per last echocardiogram done on 01/21/12 he has now improved to 45%.  -Continue with Coreg, digoxin and lisinopril. Continue with low-dose Lasix for now.     4. HYPERTENSION  -Currently controlled with lisinopril and Coreg and stable.     5. HYPERLIPIDEMIA  -Continue with statin     6. DM   -Hold  metformin for now , resume Amaryl, Lantus increased continue QAC ISS, closely monitor Accuchecks.   CBG (last 3)   Basename 04/24/12 0800 04/23/12 2121 04/23/12 1756  GLUCAP 300* 217* 271*       7. History of CORONARY ATHEROSCLEROSIS NATIVE CORONARY ARTERY   -Hold aspirin and Effient-given the potential for expanding hematoma, resume depending on patient's clinical course, continue beta blocker ACE inhibitor and statin patient is pain-free no acute issues.     8.ADENOCARCINOMA, PROSTATE  -Has prostate radioactive seeds in place, he will need outpatient followup - he recently clot related urinary retention,  [facility] just last week. Currently stable. We'll continue Flomax.    9. Fungal rash in left groin. Statin powder 3 times a day and monitor. He the area dry and clean.     DVT Prophylaxis  SCDs  To Rt Leg      Leroy Sea M.D on 04/24/2012 at 11:19 AM  [facility] Office  [phone]

## 2012-04-24 NOTE — Consult Note (Signed)
  Proximal left calf much softer.  He moves his toes and ankle well without pain, but has developed heel cord contracture.  Exercises for dorsiflexion given.  I'll f/u in the office.  No indication for surgery.  S/P acute bleed into popliteal fossa.

## 2012-04-25 DIAGNOSIS — M7981 Nontraumatic hematoma of soft tissue: Secondary | ICD-10-CM

## 2012-04-25 DIAGNOSIS — I10 Essential (primary) hypertension: Secondary | ICD-10-CM

## 2012-04-25 DIAGNOSIS — I509 Heart failure, unspecified: Secondary | ICD-10-CM

## 2012-04-25 DIAGNOSIS — E1165 Type 2 diabetes mellitus with hyperglycemia: Secondary | ICD-10-CM

## 2012-04-25 DIAGNOSIS — IMO0001 Reserved for inherently not codable concepts without codable children: Secondary | ICD-10-CM

## 2012-04-25 LAB — GLUCOSE, CAPILLARY
Glucose-Capillary: 242 mg/dL — ABNORMAL HIGH (ref 70–99)
Glucose-Capillary: 228 mg/dL — ABNORMAL HIGH (ref 70–99)
Glucose-Capillary: 252 mg/dL — ABNORMAL HIGH (ref 70–99)

## 2012-04-25 LAB — BASIC METABOLIC PANEL
Calcium: 8.9 mg/dL (ref 8.4–10.5)
GFR calc non Af Amer: >90 mL/min (ref >90)
Sodium: 137 mEq/L (ref 135–145)

## 2012-04-25 MED ORDER — GRISEOFULVIN MICROSIZE 500 MG PO TABS
500.0000 mg | ORAL_TABLET | Freq: Two times a day (BID) | ORAL | Status: DC
  Filled 2012-04-25 (×2): qty 1

## 2012-04-25 MED ORDER — GRISEOFULVIN ULTRAMICROSIZE 125 MG PO TABS: 250.0000 mg | ORAL_TABLET | Freq: Three times a day (TID) | ORAL | Status: AC

## 2012-04-25 MED ORDER — GRISEOFULVIN MICROSIZE 500 MG PO TABS
500.0000 mg | ORAL_TABLET | Freq: Two times a day (BID) | ORAL | Status: DC
  Administered 2012-04-25: 500 mg via ORAL
  Filled 2012-04-25 (×2): qty 1

## 2012-04-25 NOTE — Discharge Instructions (Signed)
Dorsiflexion exercises of ankle, keep Left Leg elevated.  Keep your thighs and groin dry and clean.  Follow with Primary MD Kristian Covey, MD, MD in 3 days   Get CBC, BMP, checked 3 days by Primary MD.  Get Medicines reviewed and adjusted.  Please request your Prim.MD to go over all Hospital Tests and Procedure/Radiological results at the follow up, please get all Hospital records sent to your Prim MD by signing hospital release before you go home.  Accuchecks 4 times/day, Once in AM empty stomach and then before each meal. Log in all results and show them to your Prim.MD in 3 days. If any glucose reading is under 80 or above 300 call your Prim MD immidiately. Follow Low glucose instructions for glucose under 80 as instructed.   Activity: As tolerated with Full fall precautions use walker/cane & assistance as needed  Diet: Cardiac-Low Carb, Aspiration precautions.  For Heart failure patients - Check your Weight same time everyday, if you gain over 2 pounds, or you develop in leg swelling, experience more shortness of breath or chest pain, call your Primary MD immediately. Follow Cardiac Low Salt Diet and 1.8 lit/day fluid restriction.  Disposition Home  If you experience worsening of your admission symptoms, develop shortness of breath, life threatening emergency, suicidal or homicidal thoughts you must seek medical attention immediately by calling 911 or calling your MD immediately  if symptoms less severe.  You Must read complete instructions/literature along with all the possible adverse reactions/side effects for all the Medicines you take and that have been prescribed to you. Take any new Medicines after you have completely understood and accpet all the possible adverse reactions/side effects.   Do not drive if your were admitted for syncope or siezures until you have seen by Primary MD or a Neurologist and advised to drive.  Do not drive when taking Pain medications.     Do not take more than prescribed Pain, Sleep and Anxiety Medications  Special Instructions: If you have smoked or chewed Tobacco  in the last 2 yrs please stop smoking, stop any regular Alcohol  and or any Recreational drug use.  Wear Seat belts while driving.

## 2012-04-25 NOTE — Discharge Summary (Signed)
[facility]                                                                                   Wayne [name], 71 y.o., is a 71 y.o. male  DOB 03/07/1941  MRN [id number].  Admission date:  04/22/2012  Discharge Date:  04/25/2012  Primary MD  Kristian Covey, MD, MD  Admitting Physician  Maretta Bees, MD  Admission Diagnosis  cellulitis recurent  Discharge Diagnosis     Principal Problem:  *Leg swelling Active Problems:  ADENOCARCINOMA, PROSTATE  DM  HYPERLIPIDEMIA  HYPERTENSION  HYPERTROPHY PROSTATE W/UR OBST & OTH LUTS  CORONARY ATHEROSCLEROSIS NATIVE CORONARY ARTERY  Polymyalgia rheumatica  Systolic CHF, chronic    Past Medical History  Diagnosis Date  . DM 06/20/2009  . HYPERLIPIDEMIA 06/20/2009  . HYPERTENSION 06/20/2009  . CORONARY ATHEROSCLEROSIS NATIVE CORONARY ARTERY 01/27/2011    s/p PCI 2/12  . Herpes zoster ophthalmicus   . Arthritis   . Obesity   . GERD (gastroesophageal reflux disease)   . Ischemic cardiomyopathy 2/12    EF 31%  . Adenocarcinoma of prostate     s/p seed implants  . Polymyalgia rheumatica     Past Surgical History  Procedure Date  . Prostate surgery     cancer, seed implant  . Coronary stent placement     reports 6 stents placed     Hospital Course See H&P, Labs, Consult and Test reports for all details in brief, patient was admitted for    1. Leg leg swelling  From liquified hematoma in the left calf space(around popliteal fossa) , patient's antiplatelet medication have been held, orthopedics physician Dr. Lajoyce Corners is following the patient, he will continue to keep the leg elevated, now swelling and tenderness much improved, OK for home DC per Dr Lajoyce Corners with outpt follow up.No signs of infection stop Clinda.    2. History of Polymyalgia rheumatica  -Continue with prednisone     3.Chronic Systolic CHF, chronic  -Clinically compensated  - Per last echocardiogram done on  01/21/12 he has now improved to 45%.  -Continue with Coreg, digoxin and lisinopril. Continue with low-dose Lasix for now.     4. HYPERTENSION  -Currently controlled with lisinopril and Coreg and stable.    5. HYPERLIPIDEMIA  -Continue with statin    6. DM  Resume all home meds, requested to do accuchecks QID and follow with PCP.    7. History of CORONARY ATHEROSCLEROSIS NATIVE CORONARY ARTERY  -Hold aspirin and Effient-given the potential for expanding hematoma, resume depending on patient's clinical course by patient Heart MD, continue beta blocker ACE inhibitor and statin patient is pain-free no acute issues.     8.ADENOCARCINOMA, PROSTATE  -Has prostate radioactive seeds in place, he will need outpatient followup - he recently clot related urinary retention, required transfer [facility] just last week. Currently stable. We'll continue Flomax. Will follow with his Urologist in 1-2 weeks.      9. Fungal rash in left groin.  Tinea Cruris- PO Antifungal x 2 weeks.      Consults  Ortho-DUDA    Significant Tests:  See full reports for all details     Dg Chest 2 View  04/11/2012  *RADIOLOGY REPORT*  Clinical Data: Chest and nose congestion with body chills.  Night sweats.  CHEST - 2 VIEW  Comparison: 04/06/2012 and 01/17/2011 radiographs.  Findings: The heart size and mediastinal contours are stable. Coronary artery stents are noted.  There is stable biapical pleural calcification.  There is stable bibasilar scarring.  There is an embolized prostate brachytherapy seed within the left lower lobe which appears unchanged.  No airspace disease or pleural effusion is identified.  There are no acute osseous findings.  IMPRESSION: Stable examination.  No acute cardiopulmonary process.  Original Report Authenticated By: Gerrianne Scale, M.D.   Dg Chest 2 View  04/06/2012  *RADIOLOGY REPORT*  Clinical Data: Shortness of breath.  Chest  pain.  Hypertension.  CHEST - 2 VIEW  Comparison: 01/17/2011  Findings: Mild scarring again seen in the lung bases.  Lungs otherwise clear.  No evidence of pulmonary infiltrate or edema.  No evidence of pleural effusion.  Heart size is within normal limits. No mass or lymphadenopathy identified.  IMPRESSION: Mild bibasilar scarring.  No active disease.  Original Report Authenticated By: Danae Orleans, M.D.   Ct Tibia Fibula Left W Contrast  04/22/2012  *RADIOLOGY REPORT*  Clinical Data: Pain and swelling in the left tibia and fibula for 6 weeks.  Hematoma versus abscess.  CT OF THE LEFT TIBIA AND FIBULA WITH CONTRAST  Contrast: OMNIPAQUE IOHEXOL 300 MG/ML  SOLN  Comparison: None.  Findings: There is a peripherally enhancing septated fluid collection in the medial popliteal fossa that extends inferiorly into the posterior compartment of the leg. The collection is interposed between the medial head of the gastrocnemius and the soleus in the leg.  Long axis measurement is 26 cm on sagittal imaging.  In the popliteal region, this has multiple septations which enhance.  There is no gas identified within the lesion. Inferiorly, there is high attenuation material within the collection, which is favored to represent retracted clot rather than enhancing tissue.  There is significant mass effect.  Largest axial dimensions are 9 cm transverse by 5.5 cm AP (image 88 series 7).  The tibia and fibula appear within normal limits.  Flexor and extensor tendons of the ankle appear normal.  Accessory navicular bone incidentally noted.  The Achilles tendon appears intact.  IMPRESSION: Large peripheral enhancing fluid collection extending from popliteal fossa into the posterior compartment of the leg deep to the medial head of the gastrocnemius.  This does not have aggressive/invasive features.  This is most compatible with liquefied hematoma.  Superimposed infection cannot be excluded but is not favored. Soft tissue neoplasm is  unlikely.  This probably represents either rupture of the plantaris with subsequent hemorrhage or a hemorrhagic Baker's cyst that extended into the leg.  Original Report Authenticated By: Andreas Newport, M.D.     Today   Subjective:   Wayne [name] today has no headache,no chest abdominal pain,no new weakness tingling or numbness, feels much better wants to go home today.    Objective:   Blood pressure 126/68, pulse 80, temperature 98.2 F (36.8 C), temperature source Oral, resp. rate 20, SpO2 96.00%.  Intake/Output Summary (Last 24 hours) at 04/25/12 0923 Last data filed at 04/25/12 0512  Gross per 24 hour  Intake   1063 ml  Output    975 ml  Net     88 ml    Exam Awake Alert, Oriented *  3, No new F.N deficits, Normal affect [place].AT,PERRAL Supple Neck,No JVD, No cervical lymphadenopathy appriciated.  Symmetrical Chest wall movement, Good air movement bilaterally, CTAB RRR,No Gallops,Rubs or new Murmurs, No Parasternal Heave +ve B.Sounds, Abd Soft, Non tender, No organomegaly appriciated, No rebound -guarding or rigidity. No Cyanosis, Clubbing or edema, No new Rash or bruise, Left calf less swollen and tender Has diffuse erythematous Tinea cruris rash in the groin area  Data Review      CBC w Diff: Lab Results  Component Value Date   WBC 10.6* 04/23/2012   HGB 10.4* 04/23/2012   HCT 33.4* 04/23/2012   PLT 434* 04/23/2012   LYMPHOPCT 14 04/22/2012   MONOPCT 7 04/22/2012   EOSPCT 2 04/22/2012   BASOPCT 0 04/22/2012   CMP: Lab Results  Component Value Date   NA 137 04/25/2012   K 3.6 04/25/2012   CL 98 04/25/2012   CO2 25 04/25/2012   BUN 10 04/25/2012   CREATININE 0.74 04/25/2012   CREATININE 0.78 06/30/2011   PROT 6.0 04/23/2012   ALBUMIN 2.1* 04/23/2012   BILITOT 0.3 04/23/2012   ALKPHOS 86 04/23/2012   AST 27 04/23/2012   ALT 43 04/23/2012  .  Micro Results Recent Results (from the past 240 hour(s))  URINE CULTURE     Status: Normal   Collection Time   04/17/12 12:07 PM       Component Value Range Status Comment   Specimen Description URINE, CATHETERIZED   Final    Special Requests NONE   Final    Culture  Setup Time [id number]   Final    Colony Count NO GROWTH   Final    Culture NO GROWTH   Final    Report Status 04/18/2012 FINAL   Final      Discharge Instructions     Dorsiflexion exercises of ankle, keep Left Leg elevated.  Keep your thighs and groin dry and clean.  Follow with Primary MD Kristian Covey, MD, MD in 3 days   Get CBC, BMP, checked 3 days by Primary MD.  Get Medicines reviewed and adjusted.  Please request your Prim.MD to go over all Hospital Tests and Procedure/Radiological results at the follow up, please get all Hospital records sent to your Prim MD by signing hospital release before you go home.  Accuchecks 4 times/day, Once in AM empty stomach and then before each meal. Log in all results and show them to your Prim.MD in 3 days. If any glucose reading is under 80 or above 300 call your Prim MD immidiately. Follow Low glucose instructions for glucose under 80 as instructed.   Activity: As tolerated with Full fall precautions use walker/cane & assistance as needed  Diet: Cardiac-Low Carb, Aspiration precautions.  For Heart failure patients - Check your Weight same time everyday, if you gain over 2 pounds, or you develop in leg swelling, experience more shortness of breath or chest pain, call your Primary MD immediately. Follow Cardiac Low Salt Diet and 1.8 lit/day fluid restriction.  Disposition Home  If you experience worsening of your admission symptoms, develop shortness of breath, life threatening emergency, suicidal or homicidal thoughts you must seek medical attention immediately by calling 911 or calling your MD immediately  if symptoms less severe.  You Must read complete instructions/literature along with all the possible adverse reactions/side effects for all the Medicines you take and that have been prescribed to  you. Take any new Medicines after you have completely understood and accpet all the possible  adverse reactions/side effects.   Do not drive if your were admitted for syncope or siezures until you have seen by Primary MD or a Neurologist and advised to drive.  Do not drive when taking Pain medications.    Do not take more than prescribed Pain, Sleep and Anxiety Medications  Special Instructions: If you have smoked or chewed Tobacco  in the last 2 yrs please stop smoking, stop any regular Alcohol  and or any Recreational drug use.  Wear Seat belts while driving.    Follow-up Information    Follow up with DUDA,MARCUS V, MD in 2 weeks.   Contact information:   [address] [place] [address] [phone]       Follow up with Kristian Covey, MD. Schedule an appointment as soon as possible for a visit in 3 days.   Contact information:   [address] [place] [address] [phone]       Follow up with Marca Ancona, MD. Schedule an appointment as soon as possible for a visit in 2 weeks.   Contact information:   [address] [place] [address] [phone]       Follow up with Your Urologist. Schedule an appointment as soon as possible for a visit in 2 weeks.         Discharge Medications   Medication List  As of 04/25/2012  9:23 AM   START taking these medications         griseofulvin 125 MG tablet   Commonly known as: GRIS-PEG   Take 2 tablets (250 mg total) by mouth 3 (three) times daily. Give total 2 week supply         CONTINUE taking these medications         carvedilol 12.5 MG tablet   Commonly known as: COREG      CoQ-10 200 MG Caps      digoxin 0.125 MG tablet   Commonly known as: LANOXIN      furosemide 20 MG tablet   Commonly known as: LASIX      glimepiride 4 MG tablet   Commonly known as: AMARYL   Take 1 tablet (4 mg total) by mouth 2  (two) times daily.      HYDROcodone-acetaminophen 10-325 MG per tablet   Commonly known as: NORCO      lisinopril 10 MG tablet   Commonly known as: PRINIVIL,ZESTRIL      metFORMIN 500 MG tablet   Commonly known as: GLUCOPHAGE   Take 1 tablet (500 mg total) by mouth 2 (two) times daily with a meal.      mulitivitamin with minerals Tabs      nitroGLYCERIN 0.4 MG SL tablet   Commonly known as: NITROSTAT      pravastatin 80 MG tablet   Commonly known as: PRAVACHOL   Take 1 tablet (80 mg total) by mouth every evening.      predniSONE 1 MG tablet   Commonly known as: DELTASONE      spironolactone 25 MG tablet   Commonly known as: ALDACTONE   Take 1 tablet (25 mg total) by mouth daily.      Tamsulosin HCl 0.4 MG Caps   Commonly known as: FLOMAX         STOP taking these medications         aspirin EC 81 MG tablet      clindamycin 150 MG capsule  prasugrel 10 MG Tabs          Where to get your medications    These are the prescriptions that you need to pick up.   You may get these medications from any pharmacy.         griseofulvin 125 MG tablet             Total Time in preparing paper work, data evaluation and todays exam - 35 minutes  Leroy Sea M.D on 04/25/2012 at 9:23 AM  [facility] Office  [phone]

## 2012-04-25 NOTE — Progress Notes (Signed)
ANTIBIOTIC CONSULT NOTE - FOLLOW UP  Pharmacy Consult for Vancomcyin Indication: Leg swelling  Assessment: 70yoM admitted for leg swelling/possible leg cellulitis started on Vancomycin (Day 4) found to have a hematoma in the left calf space with Dr. Lajoyce Corners following with likely DC today (DC summary written). Renal function stable (Scr 0.74); Tmax 99'F. No culture data.  Goal of Therapy:  Vancomycin trough level 10-15 mcg/ml  Plan:  1. Vancomycin IV Q12 hours with likely DC today since plans for discharge 2. If discharge delayed, follow-up LOT, renal function, steady-state trough as appropriate.  Benjaman Pott, PharmD     Pager [phone] 04/25/2012   9:26 AM  -------------------------------------------------  Allergies  Allergen Reactions  . Atorvastatin     REACTION: sore legs    Vital Signs: Temp: 98.2 F (36.8 C) (05/12 0415) Temp src: Oral (05/12 0415) BP: 126/68 mmHg (05/12 0749) Pulse Rate: 80  (05/12 0749) Intake/Output from previous day: 05/11 0701 - 05/12 0700 In: 1063 [P.O.:390; I.V.:673] Out: 975 [Urine:975] Intake/Output from this shift:    Labs:  Basename 04/25/12 0718 04/23/12 0550 04/22/12 1410  WBC -- 10.6* 10.8*  HGB -- 10.4* 10.8*  PLT -- 434* 482*  LABCREA -- -- --  CREATININE 0.74 0.88 0.83   The CrCl is unknown because both a height and weight (above a minimum accepted value) are required for this calculation. No results found for this basename: VANCOTROUGH:2,VANCOPEAK:2,VANCORANDOM:2,GENTTROUGH:2,GENTPEAK:2,GENTRANDOM:2,TOBRATROUGH:2,TOBRAPEAK:2,TOBRARND:2,AMIKACINPEAK:2,AMIKACINTROU:2,AMIKACIN:2, in the last 72 hours   Microbiology: Recent Results (from the past 720 hour(s))  URINE CULTURE     Status: Normal   Collection Time   04/17/12 12:07 PM      Component Value Range Status Comment   Specimen Description URINE, CATHETERIZED   Final    Special Requests NONE   Final    Culture  Setup Time [id number]   Final    Colony Count NO GROWTH    Final    Culture NO GROWTH   Final    Report Status 04/18/2012 FINAL   Final     Anti-infectives     Start     Dose/Rate Route Frequency Ordered Stop   04/25/12 0930   griseofulvin (GRIFULVIN V) tablet 500 mg        500 mg Oral 2 times daily 04/25/12 0917     04/25/12 0000   griseofulvin (GRIS-PEG) 125 MG tablet        250 mg Oral 3 times daily 04/25/12 0915 05/09/12 2359   04/23/12 0600   vancomycin (VANCOCIN) IVPB 1000 mg/200 mL premix        1,000 mg 200 mL/hr over 60 Minutes Intravenous Every 12 hours 04/22/12 1728     04/22/12 1800   vancomycin (VANCOCIN) 2,250 mg in sodium chloride 0.9 % 500 mL IVPB        2,250 mg 250 mL/hr over 120 Minutes Intravenous  Once 04/22/12 1728 04/22/12 2211

## 2012-04-29 ENCOUNTER — Telehealth: Payer: Self-pay | Admitting: *Deleted

## 2012-04-29 ENCOUNTER — Telehealth: Payer: Self-pay | Admitting: Cardiology

## 2012-04-29 NOTE — Telephone Encounter (Signed)
Spoke with Wayne [name]. Wayne [name] states that he does not have any follow-up scheduled with Dr Lajoyce Corners. He was told to follow-up with his PCP and Dr Shirlee Latch. Wayne [name] did tell me his wife had him stop all his other medications except antibiotics about 3 days ago.  I have given Wayne [name] an appt with [name] 05/03/12 to discuss his medications. I have urged Wayne [name] to call his PCP for follow-up and to discuss getting back on any other medications, including his diabetes medications. Wayne [name] verbalized understanding and agreed with this plan.

## 2012-04-29 NOTE — Telephone Encounter (Signed)
I need to see Mr Wayne [name] soon. They need to ask Dr. Lajoyce [name] how long until he can restart aspirin.  ----- Message ----- From: Wayne Sea, MD Sent: 04/25/2012 9:24 AM To: Wayne Morale, MD, Wayne Covey, MD Subject: Inpatient Notes   04/29/12 pt has appt 05/25/12 with Dr Wayne [name]. LMTCB for pt to ask about restarting aspirin.

## 2012-04-29 NOTE — Telephone Encounter (Signed)
Please return call to patient at [phone] regarding contact number for Dr. Lysle Morales referral

## 2012-04-29 NOTE — Telephone Encounter (Signed)
Spoke with pt. He states the patient's wife  was asking for the phone number for [facility]. I gave the pt our office phone number.

## 2012-05-03 ENCOUNTER — Encounter: Payer: Self-pay | Admitting: Physician Assistant

## 2012-05-03 ENCOUNTER — Ambulatory Visit (INDEPENDENT_AMBULATORY_CARE_PROVIDER_SITE_OTHER): Payer: Medicare Other | Admitting: Physician Assistant

## 2012-05-03 ENCOUNTER — Other Ambulatory Visit: Payer: Self-pay | Admitting: Cardiology

## 2012-05-03 VITALS — BP 112/70 | HR 116 | Ht 70.0 in | Wt 232.0 lb

## 2012-05-03 DIAGNOSIS — I428 Other cardiomyopathies: Secondary | ICD-10-CM

## 2012-05-03 DIAGNOSIS — R61 Generalized hyperhidrosis: Secondary | ICD-10-CM | POA: Diagnosis not present

## 2012-05-03 DIAGNOSIS — E785 Hyperlipidemia, unspecified: Secondary | ICD-10-CM

## 2012-05-03 DIAGNOSIS — I2510 Atherosclerotic heart disease of native coronary artery without angina pectoris: Secondary | ICD-10-CM | POA: Diagnosis not present

## 2012-05-03 DIAGNOSIS — S8010XA Contusion of unspecified lower leg, initial encounter: Secondary | ICD-10-CM

## 2012-05-03 MED ORDER — PRAVASTATIN SODIUM 80 MG PO TABS: 80.0000 mg | ORAL_TABLET | Freq: Every evening | ORAL | Status: DC

## 2012-05-03 MED ORDER — FUROSEMIDE 20 MG PO TABS: 20.0000 mg | ORAL_TABLET | ORAL | Status: DC | PRN

## 2012-05-03 MED ORDER — SPIRONOLACTONE 25 MG PO TABS: 25.0000 mg | ORAL_TABLET | Freq: Every day | ORAL | Status: DC

## 2012-05-03 MED ORDER — DIGOXIN 125 MCG PO TABS: 125.0000 ug | ORAL_TABLET | Freq: Every day | ORAL | Status: DC

## 2012-05-03 MED ORDER — GLIMEPIRIDE 4 MG PO TABS: 4.0000 mg | ORAL_TABLET | Freq: Two times a day (BID) | ORAL | Status: DC

## 2012-05-03 MED ORDER — LISINOPRIL 10 MG PO TABS: 10.0000 mg | ORAL_TABLET | Freq: Every day | ORAL | Status: DC

## 2012-05-03 MED ORDER — CARVEDILOL 12.5 MG PO TABS: 12.5000 mg | ORAL_TABLET | Freq: Two times a day (BID) | ORAL | Status: DC

## 2012-05-03 MED ORDER — NITROGLYCERIN 0.4 MG SL SUBL: 0.4000 mg | SUBLINGUAL_TABLET | SUBLINGUAL | Status: DC | PRN

## 2012-05-03 MED ORDER — METFORMIN HCL 500 MG PO TABS: 500.0000 mg | ORAL_TABLET | Freq: Two times a day (BID) | ORAL | Status: DC

## 2012-05-03 NOTE — Patient Instructions (Addendum)
FOLLOW UP WITH DR. DUDA ON 05/13/12 @ 3:45 PM  FOLLOW UP WITH DR. Caryl Never ON 05/07/12 @ 4:15 PM  Your physician has recommended you make the following change in your medication: RESTART: CARVEDILOL 12.5 MG TWICE DAILY; DIGOXIN 0.125 DAILY; LASIX 20 DAILY AS NEEDED; AMARYL 4 MG TWICE DAILY; LISINOPRIL 10 MG DAILY; METFORMIN 500 MG TWICE DAILY; PRAVASTATIN 80 DAILY; SPIRONOLACTONE 25 MG DAILY  TODAY BMET, CBC W/DIFF , TSH  REPEAT BMET IN 1 WEEK 05/11/12  KEEP DR. MCLEAN APPT

## 2012-05-03 NOTE — Progress Notes (Signed)
[address] [place], [place]  [address] Phone: [phone] Fax:  [phone]  Date:  05/03/2012   Name:  Wayne [name]   DOB:  07/28/1941   MRN:  [id number]  PCP:  Kristian Covey, MD, MD  Primary Cardiologist:  Dr. Marca Ancona  Primary Electrophysiologist:  None    History of Present Illness: Wayne [name] is a 71 y.o. male who returns for follow up.  He has a history of DM, HTN, CAD, and ischemic cardiomyopathy. Patient had a CHF exacerbation in 2/12 and was found to have LV systolic dysfunction with EF around 20%. LHC showed RCA, ramus, and CFX disease. RCA was subtotally occluded. Cardiac MRI showed that all walls, including the inferior wall, should be viable. Patient therefore underwent opening of his chronic totally occluded RCA as well as PCI to the ramus. He received drug eluting stents and was placed on Effient. Recent repeat cardiac MRI with medical treatment of systolic CHF showed EF 32%. He is on CPAP for severe OSA. Last echo in 2/13 showed EF improved to 45% with moderate LV dilation and mild LV hypertrophy.   Diagnosed with PMR by a rheumatologist and placed on prednisone this year.  He was admitted 5/9-5/12 with a large left lower extremity hematoma.  CT scan of his left lower extremity demonstrated a large peripheral enhancing fluid collection extending from the popliteal fossa into the posterior compartment of the leg deep to the medial head of the gastrocnemius most compatible with liquefied hematoma.  He was taken off of his aspirin and Effient.  He has been followed by Dr. Lajoyce Corners.  We are awaiting clearance from him as to when the patient can restart aspirin.  Unfortunately, the patient stopped all his medications, including his diabetic medications.  Reasons for this are unclear.  He denies chest pain, shortness of breath, orthopnea, PND, syncope.  His left lower extremity is swollen.  It is still painful.  It is improving.  Otherwise, he denies  any worsening edema.  Wt Readings from Last 3 Encounters:  05/03/12 232 lb (105.235 kg)  04/17/12 242 lb 11.6 oz (110.1 kg)  04/11/12 240 lb (108.863 kg)     Potassium  Date/Time Value Range Status  04/25/2012  7:18 AM 3.6  3.5-5.1 (mEq/L) Final     Creatinine, Ser  Date/Time Value Range Status  04/25/2012  7:18 AM 0.74  0.50-1.35 (mg/dL) Final     ALT  Date/Time Value Range Status  04/23/2012  5:50 AM 43  0-53 (U/L) Final     TSH  Date/Time Value Range Status  02/24/2011  9:17 AM 1.00  0.35-5.50 (uIU/mL) Final     Hemoglobin  Date/Time Value Range Status  04/23/2012  5:50 AM 10.4* 13.0-17.0 (g/dL) Final    Past Medical History:  1. HYPERTENSION  2. HYPERLIPIDEMIA  3. ECZEMA  4. RHINITIS  5. HERPES ZOSTER OPHTHALMICUS  6. ADENOCARCINOMA, PROSTATE: Status post prostatectomy in 2009. Has had some incontinence since then.  7. Diabetes mellitus type II  8. Arthritis  9. Obesity  10. GERD: rare  11. CAD: Presented with exertional dyspnea, never had chest pain. LHC (2/12) with subtotalled proximal RCA and left to right collaterals, 90% proximal moderate-sized ramus, 95% proximal relatively small CFX, 40-50% proximal LAD. Cardiac MRI (2/12) showed EF 21%, some mild scar in basal segments but all wall segments would be expected to be viable.  a. DES x 5 (overlapping) to RCA 01/30/11  b. DES x 1 to  RI 01/30/11  12. Ischemic CMP: Echo (2/12) with moderately dilated LV, EF about 20% with diffuse hypokinesis and inferior akinesis, pseudonormal diastolic function, mild MR, severe LAE, mildly decreased RV systolic function. RHC (2/12) with mean RA 12, PA 40/25, mean PCWP 26, CI 2.1. Echo (5/12) with EF 40% (appeared worse to my eye) with posterior HK, basal inferior AK, inferoseptal AK, basal anteroseptal AK, mild MR. Cardiac MRI was repeated and showed EF 32% (improved from 21%) and mild LV dilation (was severely dilated before) with diffuse hypokinesis and subendocardial scar in the basal  inferior, basal posterior, and basal anterolateral segments. Echo (2/13) with EF 45%, moderate LV dilation, mild LVH.  13. Cervical OA.  14. Polymyalgia rheumatica  15. OSA: Severe on sleep study 10/12. On CPAP.  16. PMR 17. Large posterior leg hematoma 04/2012  Current Outpatient Prescriptions  Medication Sig Dispense Refill  . clindamycin (CLEOCIN) 150 MG capsule Take 450 mg by mouth 3 (three) times daily.       Marland Kitchen griseofulvin (GRIS-PEG) 125 MG tablet Take 2 tablets (250 mg total) by mouth 3 (three) times daily. Give total 2 week supply  84 tablet  0  . Multiple Vitamin (MULITIVITAMIN WITH MINERALS) TABS Take 1 tablet by mouth daily.      . carvedilol (COREG) 12.5 MG tablet Take 18.75 mg by mouth 2 (two) times daily with a meal. (ON HOLD)      . Coenzyme Q10 (COQ-10) 200 MG CAPS Take 1 capsule by mouth daily. (ON HOLD)      . digoxin (LANOXIN) 0.125 MG tablet Take 125 mcg by mouth daily. (ON HOLD)      . furosemide (LASIX) 20 MG tablet Take 20 mg by mouth as needed. (ON HOLD)      . glimepiride (AMARYL) 4 MG tablet Take 4 mg by mouth 2 (two) times daily. (ON HOLD)      . lisinopril (PRINIVIL,ZESTRIL) 10 MG tablet Take 10 mg by mouth daily. (ON HOLD)      . metFORMIN (GLUCOPHAGE) 500 MG tablet Take 500 mg by mouth 2 (two) times daily with a meal. (ON HOLD)      . nitroGLYCERIN (NITROSTAT) 0.4 MG SL tablet Place 1 tablet (0.4 mg total) under the tongue every 5 (five) minutes as needed. For chest pain.  25 tablet  11  . pravastatin (PRAVACHOL) 80 MG tablet Take 80 mg by mouth every evening. (ON HOLD)      . predniSONE (DELTASONE) 1 MG tablet Take 6 mg by mouth daily. (ON HOLD)      . spironolactone (ALDACTONE) 25 MG tablet (ON HOLD)      . Tamsulosin HCl (FLOMAX) 0.4 MG CAPS Take 0.4 mg by mouth daily. (ON HOLD)      . DISCONTD: glimepiride (AMARYL) 4 MG tablet Take 1 tablet (4 mg total) by mouth 2 (two) times daily.  60 tablet  6  . DISCONTD: metFORMIN (GLUCOPHAGE) 500 MG tablet Take 1 tablet  (500 mg total) by mouth 2 (two) times daily with a meal.  60 tablet  3  . DISCONTD: nitroGLYCERIN (NITROSTAT) 0.4 MG SL tablet Place 0.4 mg under the tongue every 5 (five) minutes as needed. For chest pain.      Marland Kitchen DISCONTD: pravastatin (PRAVACHOL) 80 MG tablet Take 1 tablet (80 mg total) by mouth every evening.  30 tablet  6  . DISCONTD: spironolactone (ALDACTONE) 25 MG tablet Take 1 tablet (25 mg total) by mouth daily.  30 tablet  3  .  DISCONTD: spironolactone (ALDACTONE) 25 MG tablet TAKE 1 TABLET (25 MG TOTAL) BY MOUTH DAILY.  30 tablet  6   Current Facility-Administered Medications  Medication Dose Route Frequency Provider Last Rate Last Dose  . cefTRIAXone (ROCEPHIN) injection 1 g  1 g Intramuscular Q24H Kristian Covey, MD        Allergies: Allergies  Allergen Reactions  . Atorvastatin     REACTION: sore legs    History  Substance Use Topics  . Smoking status: Former Smoker -- 0.3 packs/day for 20 years    Types: Cigarettes    Quit date: 02/23/1989  . Smokeless tobacco: Never Used  . Alcohol Use: No     ROS:  Please see the history of present illness.     All other systems reviewed and negative.   PHYSICAL EXAM: VS:  BP 112/70  Pulse 116  Ht 5\' 10"  (1.778 m)  Wt 232 lb (105.235 kg)  BMI 33.29 kg/m2 Well nourished, well developed, in no acute distress HEENT: normal Neck: no JVD Cardiac:  normal S1, S2; RRR; no murmur Lungs:  clear to auscultation bilaterally, no wheezing, rhonchi or rales Abd: soft, nontender, no hepatomegaly Ext: Left lower extremity with tight edema noted in the popliteal fossa extending down into the mid calf region Skin: warm and dry Neuro:  CNs 2-12 intact, no focal abnormalities noted  EKG:  Sinus tachycardia, rate 116, left axis deviation, no significant change from prior tracing   ASSESSMENT AND PLAN:  1.  Left Leg Hematoma Managed by Dr. Lajoyce Corners.  We have arranged early followup.  2.  Coronary Artery Disease He had 5 overlapping DES  to the RCA and needs antiplatelet Rx. However, he is off of Effient and aspirin due to his left leg hematoma.   We need to get him back on aspirin as soon as possible.  Defer the timing of this to Dr. Lajoyce Corners. As soon as it is safe from his leg standpoint, he needs to restart ASA 81 mg daily.  3.  Ischemic Cardiomyopathy He stopped all his medications for unclear reasons. His blood pressure is somewhat low.  I discussed with Dr. Shirlee Latch. He has tolerated all these medications well the past.  Restart carvedilol at a lower dose 12.5 mg twice daily. Restart all her other medications (lisinopril, spironolactone, digoxin, Lasix) as taken previously. Followup with Dr. Shirlee Latch as scheduled  4.  Diabetes Mellitus Restart Metformin and amaryl. Follow up with PCP.  5.  Hyperlipidemia Restart statin.   Signed, [name], PA-C  2:47 PM 05/03/2012

## 2012-05-04 ENCOUNTER — Telehealth: Payer: Self-pay | Admitting: *Deleted

## 2012-05-04 LAB — CBC WITH DIFFERENTIAL/PLATELET
Basophils Relative: 0.8 % (ref 0.0–3.0)
Eosinophils Absolute: 0.1 10*3/uL (ref 0.0–0.7)
Eosinophils Relative: 1.2 % (ref 0.0–5.0)
Hemoglobin: 12.7 g/dL — ABNORMAL LOW (ref 13.0–17.0)
Lymphocytes Relative: 25.6 % (ref 12.0–46.0)
MCHC: 31.7 g/dL (ref 30.0–36.0)
MCV: 86.8 fl (ref 78.0–100.0)
Neutro Abs: 4.9 10*3/uL (ref 1.4–7.7)
RBC: 4.61 Mil/uL (ref 4.22–5.81)
WBC: 7.6 10*3/uL (ref 4.5–10.5)

## 2012-05-04 LAB — BASIC METABOLIC PANEL
CO2: 25 mEq/L (ref 19–32)
Calcium: 9 mg/dL (ref 8.4–10.5)
Chloride: 104 mEq/L (ref 96–112)
Sodium: 139 mEq/L (ref 135–145)

## 2012-05-04 LAB — TSH: TSH: 0.78 u[IU]/mL (ref 0.35–5.50)

## 2012-05-04 NOTE — Telephone Encounter (Signed)
pt notified of lab results today 

## 2012-05-04 NOTE — Telephone Encounter (Signed)
Message copied by Tarri Fuller on Tue May 04, 2012  3:10 PM ------      Message from: [place], [place] T      Created: Tue May 04, 2012  2:32 PM       Please notify patient that the lab results are ok.      [name], PA-C  2:32 PM 05/04/2012

## 2012-05-05 ENCOUNTER — Other Ambulatory Visit: Payer: Self-pay | Admitting: *Deleted

## 2012-05-05 NOTE — Telephone Encounter (Signed)
Who filled? ?orthopedist.  He has been followed by orthopedist since hospital stay.  If ortho still following and this is in regard to leg pain would have him contact their office.

## 2012-05-05 NOTE — Telephone Encounter (Signed)
Hydrocodone 10-325 refill request, one every 6 hours prn pain, it appears you have not filled this yet?

## 2012-05-06 NOTE — Telephone Encounter (Signed)
I did so digging and found he received med from you on 04/22/12, #20 with 0 refills

## 2012-05-06 NOTE — Telephone Encounter (Signed)
Refill once 

## 2012-05-07 ENCOUNTER — Ambulatory Visit: Payer: Medicare Other | Admitting: Family Medicine

## 2012-05-07 MED ORDER — HYDROCODONE-ACETAMINOPHEN 10-325 MG PO TABS: 1.0000 | ORAL_TABLET | Freq: Four times a day (QID) | ORAL | Status: AC | PRN

## 2012-05-11 ENCOUNTER — Other Ambulatory Visit: Payer: Medicare Other

## 2012-05-13 ENCOUNTER — Telehealth: Payer: Self-pay | Admitting: Cardiology

## 2012-05-13 DIAGNOSIS — T79A29A Traumatic compartment syndrome of unspecified lower extremity, initial encounter: Secondary | ICD-10-CM | POA: Diagnosis not present

## 2012-05-13 NOTE — Telephone Encounter (Signed)
New Problem:     I called the patient's wife to reschedule their appointment with Dr. Shirlee Latch on 05/25/12 because he will be out of the office, and was only able to leave a message for her to call back to reschedule.

## 2012-05-20 ENCOUNTER — Telehealth: Payer: Self-pay | Admitting: Physician Assistant

## 2012-05-20 DIAGNOSIS — T79A29A Traumatic compartment syndrome of unspecified lower extremity, initial encounter: Secondary | ICD-10-CM | POA: Diagnosis not present

## 2012-05-20 NOTE — Telephone Encounter (Signed)
Can we check with Mr. Hemric to see if Dr. Lajoyce Corners cleared him to take ASA yet? Thanks [place], [place]  5:49 PM 05/20/2012

## 2012-05-24 NOTE — Telephone Encounter (Signed)
Called and spoke to patients spouse today regarding the medications that the patient was supposed to restart.  Per patients wife, patient is not taking ANY medication as of right now.  They have stopped all medications with the hope that this will progress the healing on his leg.  This patient has not been cleared by Dr. Lajoyce Corners to restart ASA.  Forward to [facility], [place] for review.

## 2012-05-24 NOTE — Telephone Encounter (Signed)
Okey Regal, Can you call Dr. Audrie Lia office and ask if the patient has been cleared to re-start ASA yet? Thanks [name], PA-C  9:58 PM 05/24/2012

## 2012-05-25 ENCOUNTER — Ambulatory Visit: Payer: Medicare Other | Admitting: Physician Assistant

## 2012-05-25 ENCOUNTER — Ambulatory Visit: Payer: Medicare Other | Admitting: Cardiology

## 2012-05-26 ENCOUNTER — Telehealth: Payer: Self-pay | Admitting: *Deleted

## 2012-05-26 NOTE — Telephone Encounter (Signed)
I s/w Wayne [name] @ Dr. Duda's office and she said she did not see where Dr. Duda cleared for pt to re-start  ASA. Wayne [name] states she will send a note to Dr. Duda and call Scott Weaver, PAC back with an answer 

## 2012-05-26 NOTE — Telephone Encounter (Signed)
I s/w Wayne [name] @ Dr. Audrie Lia office and she said she did not see where Dr. Lajoyce Corners cleared for pt to re-start  ASA. Wayne [name] states she will send a note to Dr. Lajoyce Corners and call Wayne [name], [facility] back with an answer

## 2012-05-28 ENCOUNTER — Telehealth: Payer: Self-pay | Admitting: Physician Assistant

## 2012-05-28 NOTE — Telephone Encounter (Signed)
Aut[place]- Dr. Lajoyce Corners office  [phone]  Returning [place] call: It is ok for patient to resume aspirin.

## 2012-05-28 NOTE — Telephone Encounter (Signed)
[place], FYI - looks like he's canceled follow ups with you and me. [name], PA-C  2:28 PM 05/28/2012

## 2012-05-28 NOTE — Telephone Encounter (Signed)
He should resume ASA 325 mg QD. He should also resume cardiac medications that we resumed at his last visit. He needs follow up with Dr. Marca Ancona. [name], PA-C  2:29 PM 05/28/2012

## 2012-05-28 NOTE — Telephone Encounter (Signed)
Reviewed chart. Phone call today from Dr. Audrie Lia office.  Ok for him to restart ASA. Please notify patient he should restart ASA 325 mg QD. [name], PA-C  2:27 PM 05/28/2012

## 2012-05-30 NOTE — Telephone Encounter (Signed)
It is not a good idea for him to be off his medications.  He is going to develop heart failure and risks thrombosing one of his 5 drug eluting stents.  He needs to get a followup appointment.  Please call to schedule and encourage him to restart his home meds including aspirin but leaving off Plavix for now.

## 2012-05-31 ENCOUNTER — Telehealth: Payer: Self-pay | Admitting: *Deleted

## 2012-05-31 NOTE — Telephone Encounter (Signed)
LM on identified v.m. Telling pt he needs to take all meds except plavix and needs to make f/u appoint

## 2012-06-02 DIAGNOSIS — T79A29A Traumatic compartment syndrome of unspecified lower extremity, initial encounter: Secondary | ICD-10-CM | POA: Diagnosis not present

## 2012-06-02 NOTE — Telephone Encounter (Signed)
lmom per Dr. McLean pt needs to re-start all medications EXCEPT plavix. per Dr. Mclean pt needs f/u he cx both appt with Scott W. PAC and Dr. McLean 

## 2012-06-02 NOTE — Telephone Encounter (Signed)
Called yesterday and left message

## 2012-06-02 NOTE — Telephone Encounter (Signed)
lmom per Dr. Shirlee Latch pt needs to re-start all medications EXCEPT plavix. per Dr. Shirlee Latch pt needs f/u he cx both appt with Bing Neighbors. PAC and Dr. Shirlee Latch

## 2012-06-02 NOTE — Telephone Encounter (Signed)
Wayne [name], could you check back with [name] regarding his meds and followup?

## 2012-06-03 NOTE — Telephone Encounter (Signed)
Spoke with pt's wife-pt asleep. Wife states that pt saw provider at Dr Audrie Lia office yesterday. Pt was told not to restart any" blood thinners". I have callled Dr Audrie Lia office to confirm that pt has been told not to restart aspirin since there are notes from 05/28/12 that Dr Lajoyce Corners has given the OK for pt to restart aspirin. I was told by Byrd Hesselbach at Dr Duda's office [phone] that pt saw Dr Ophelia Charter yesterday. Byrd Hesselbach will ask someone to call be back to get clarification on whether pt can restart aspirin. Pt's wife states the only medication the pt is currently taking is his diabetes medications.

## 2012-06-03 NOTE — Telephone Encounter (Signed)
I spoke with Dr Ophelia Charter today. It is OK with Dr Ophelia Charter for pt to restart aspirin. Dr Shirlee Latch recommended pt restart aspirin 81mg  daily. I spoke with pt's wife to let her know OK with Dr Ophelia Charter to restart aspirin and Dr Shirlee Latch has recommended aspirin 81mg  daily.

## 2012-06-03 NOTE — Telephone Encounter (Signed)
I encouraged wife to have pt restart his cardiac medications. I also encouraged wife to schedule an appt for pt with Dr Shirlee Latch. I offered pt an appt 06/07/12. Wife declined to make appt at the present time, mainly because she said it was very hard for pt to move about. She will call me back when she is ready to schedule an appt for pt with Dr Shirlee Latch.

## 2012-06-04 DIAGNOSIS — T79A29A Traumatic compartment syndrome of unspecified lower extremity, initial encounter: Secondary | ICD-10-CM | POA: Diagnosis not present

## 2012-06-07 DIAGNOSIS — N3941 Urge incontinence: Secondary | ICD-10-CM | POA: Diagnosis not present

## 2012-06-07 DIAGNOSIS — C61 Malignant neoplasm of prostate: Secondary | ICD-10-CM | POA: Diagnosis not present

## 2012-06-14 ENCOUNTER — Inpatient Hospital Stay (HOSPITAL_COMMUNITY): Payer: Medicare Other

## 2012-06-14 ENCOUNTER — Emergency Department (HOSPITAL_COMMUNITY): Payer: Medicare Other

## 2012-06-14 ENCOUNTER — Encounter (HOSPITAL_COMMUNITY): Payer: Self-pay

## 2012-06-14 ENCOUNTER — Inpatient Hospital Stay (HOSPITAL_COMMUNITY)
Admission: EM | Admit: 2012-06-14 | Discharge: 2012-07-02 | DRG: 853 | Disposition: A | Discharge status: Other Acute Inpatient Hospital | Payer: Medicare Other | Source: Ambulatory Visit | Attending: Internal Medicine | Admitting: Internal Medicine

## 2012-06-14 DIAGNOSIS — Z9861 Coronary angioplasty status: Secondary | ICD-10-CM

## 2012-06-14 DIAGNOSIS — Z8546 Personal history of malignant neoplasm of prostate: Secondary | ICD-10-CM | POA: Diagnosis not present

## 2012-06-14 DIAGNOSIS — R05 Cough: Secondary | ICD-10-CM | POA: Diagnosis not present

## 2012-06-14 DIAGNOSIS — K560 Paralytic ileus: Secondary | ICD-10-CM | POA: Diagnosis not present

## 2012-06-14 DIAGNOSIS — L97909 Non-pressure chronic ulcer of unspecified part of unspecified lower leg with unspecified severity: Secondary | ICD-10-CM | POA: Diagnosis not present

## 2012-06-14 DIAGNOSIS — J189 Pneumonia, unspecified organism: Secondary | ICD-10-CM | POA: Diagnosis present

## 2012-06-14 DIAGNOSIS — J9600 Acute respiratory failure, unspecified whether with hypoxia or hypercapnia: Secondary | ICD-10-CM | POA: Diagnosis present

## 2012-06-14 DIAGNOSIS — M129 Arthropathy, unspecified: Secondary | ICD-10-CM | POA: Diagnosis present

## 2012-06-14 DIAGNOSIS — R059 Cough, unspecified: Secondary | ICD-10-CM | POA: Diagnosis not present

## 2012-06-14 DIAGNOSIS — L03119 Cellulitis of unspecified part of limb: Secondary | ICD-10-CM | POA: Diagnosis present

## 2012-06-14 DIAGNOSIS — Y9210 Unspecified residential institution as the place of occurrence of the external cause: Secondary | ICD-10-CM | POA: Diagnosis not present

## 2012-06-14 DIAGNOSIS — R34 Anuria and oliguria: Secondary | ICD-10-CM | POA: Diagnosis not present

## 2012-06-14 DIAGNOSIS — C61 Malignant neoplasm of prostate: Secondary | ICD-10-CM

## 2012-06-14 DIAGNOSIS — I2589 Other forms of chronic ischemic heart disease: Secondary | ICD-10-CM | POA: Diagnosis present

## 2012-06-14 DIAGNOSIS — I214 Non-ST elevation (NSTEMI) myocardial infarction: Secondary | ICD-10-CM | POA: Diagnosis present

## 2012-06-14 DIAGNOSIS — I5022 Chronic systolic (congestive) heart failure: Secondary | ICD-10-CM | POA: Diagnosis not present

## 2012-06-14 DIAGNOSIS — E43 Unspecified severe protein-calorie malnutrition: Secondary | ICD-10-CM | POA: Diagnosis not present

## 2012-06-14 DIAGNOSIS — I252 Old myocardial infarction: Secondary | ICD-10-CM

## 2012-06-14 DIAGNOSIS — I509 Heart failure, unspecified: Secondary | ICD-10-CM

## 2012-06-14 DIAGNOSIS — R5381 Other malaise: Secondary | ICD-10-CM | POA: Diagnosis not present

## 2012-06-14 DIAGNOSIS — I776 Arteritis, unspecified: Secondary | ICD-10-CM | POA: Diagnosis not present

## 2012-06-14 DIAGNOSIS — A4101 Sepsis due to Methicillin susceptible Staphylococcus aureus: Principal | ICD-10-CM | POA: Diagnosis present

## 2012-06-14 DIAGNOSIS — Z4682 Encounter for fitting and adjustment of non-vascular catheter: Secondary | ICD-10-CM | POA: Diagnosis not present

## 2012-06-14 DIAGNOSIS — M729 Fibroblastic disorder, unspecified: Secondary | ICD-10-CM | POA: Diagnosis present

## 2012-06-14 DIAGNOSIS — T8132XA Disruption of internal operation (surgical) wound, not elsewhere classified, initial encounter: Secondary | ICD-10-CM | POA: Diagnosis not present

## 2012-06-14 DIAGNOSIS — J209 Acute bronchitis, unspecified: Secondary | ICD-10-CM | POA: Diagnosis not present

## 2012-06-14 DIAGNOSIS — L02419 Cutaneous abscess of limb, unspecified: Secondary | ICD-10-CM

## 2012-06-14 DIAGNOSIS — E119 Type 2 diabetes mellitus without complications: Secondary | ICD-10-CM

## 2012-06-14 DIAGNOSIS — M76899 Other specified enthesopathies of unspecified lower limb, excluding foot: Secondary | ICD-10-CM

## 2012-06-14 DIAGNOSIS — I2510 Atherosclerotic heart disease of native coronary artery without angina pectoris: Secondary | ICD-10-CM | POA: Diagnosis present

## 2012-06-14 DIAGNOSIS — K219 Gastro-esophageal reflux disease without esophagitis: Secondary | ICD-10-CM | POA: Diagnosis present

## 2012-06-14 DIAGNOSIS — R0602 Shortness of breath: Secondary | ICD-10-CM | POA: Diagnosis not present

## 2012-06-14 DIAGNOSIS — R0902 Hypoxemia: Secondary | ICD-10-CM | POA: Diagnosis not present

## 2012-06-14 DIAGNOSIS — S81009A Unspecified open wound, unspecified knee, initial encounter: Secondary | ICD-10-CM | POA: Diagnosis not present

## 2012-06-14 DIAGNOSIS — R7301 Impaired fasting glucose: Secondary | ICD-10-CM | POA: Diagnosis not present

## 2012-06-14 DIAGNOSIS — D638 Anemia in other chronic diseases classified elsewhere: Secondary | ICD-10-CM | POA: Diagnosis present

## 2012-06-14 DIAGNOSIS — N170 Acute kidney failure with tubular necrosis: Secondary | ICD-10-CM | POA: Diagnosis not present

## 2012-06-14 DIAGNOSIS — I5021 Acute systolic (congestive) heart failure: Secondary | ICD-10-CM | POA: Diagnosis not present

## 2012-06-14 DIAGNOSIS — E1365 Other specified diabetes mellitus with hyperglycemia: Secondary | ICD-10-CM | POA: Diagnosis present

## 2012-06-14 DIAGNOSIS — I5023 Acute on chronic systolic (congestive) heart failure: Secondary | ICD-10-CM | POA: Diagnosis present

## 2012-06-14 DIAGNOSIS — Z452 Encounter for adjustment and management of vascular access device: Secondary | ICD-10-CM | POA: Diagnosis not present

## 2012-06-14 DIAGNOSIS — I739 Peripheral vascular disease, unspecified: Secondary | ICD-10-CM | POA: Diagnosis present

## 2012-06-14 DIAGNOSIS — E872 Acidosis, unspecified: Secondary | ICD-10-CM | POA: Diagnosis present

## 2012-06-14 DIAGNOSIS — R6520 Severe sepsis without septic shock: Secondary | ICD-10-CM | POA: Diagnosis not present

## 2012-06-14 DIAGNOSIS — D649 Anemia, unspecified: Secondary | ICD-10-CM | POA: Diagnosis not present

## 2012-06-14 DIAGNOSIS — Z9119 Patient's noncompliance with other medical treatment and regimen: Secondary | ICD-10-CM

## 2012-06-14 DIAGNOSIS — I319 Disease of pericardium, unspecified: Secondary | ICD-10-CM | POA: Diagnosis not present

## 2012-06-14 DIAGNOSIS — Y838 Other surgical procedures as the cause of abnormal reaction of the patient, or of later complication, without mention of misadventure at the time of the procedure: Secondary | ICD-10-CM | POA: Diagnosis not present

## 2012-06-14 DIAGNOSIS — M353 Polymyalgia rheumatica: Secondary | ICD-10-CM | POA: Diagnosis present

## 2012-06-14 DIAGNOSIS — E669 Obesity, unspecified: Secondary | ICD-10-CM | POA: Diagnosis present

## 2012-06-14 DIAGNOSIS — I10 Essential (primary) hypertension: Secondary | ICD-10-CM

## 2012-06-14 DIAGNOSIS — G9340 Encephalopathy, unspecified: Secondary | ICD-10-CM | POA: Diagnosis present

## 2012-06-14 DIAGNOSIS — R109 Unspecified abdominal pain: Secondary | ICD-10-CM | POA: Diagnosis not present

## 2012-06-14 DIAGNOSIS — T81329A Deep disruption or dehiscence of operation wound, unspecified, initial encounter: Secondary | ICD-10-CM | POA: Diagnosis not present

## 2012-06-14 DIAGNOSIS — F411 Generalized anxiety disorder: Secondary | ICD-10-CM | POA: Diagnosis not present

## 2012-06-14 DIAGNOSIS — E1110 Type 2 diabetes mellitus with ketoacidosis without coma: Secondary | ICD-10-CM

## 2012-06-14 DIAGNOSIS — R000 Tachycardia, unspecified: Secondary | ICD-10-CM | POA: Diagnosis not present

## 2012-06-14 DIAGNOSIS — G4733 Obstructive sleep apnea (adult) (pediatric): Secondary | ICD-10-CM | POA: Diagnosis not present

## 2012-06-14 DIAGNOSIS — Z91199 Patient's noncompliance with other medical treatment and regimen due to unspecified reason: Secondary | ICD-10-CM

## 2012-06-14 DIAGNOSIS — Z87891 Personal history of nicotine dependence: Secondary | ICD-10-CM

## 2012-06-14 DIAGNOSIS — E785 Hyperlipidemia, unspecified: Secondary | ICD-10-CM | POA: Diagnosis present

## 2012-06-14 DIAGNOSIS — I499 Cardiac arrhythmia, unspecified: Secondary | ICD-10-CM | POA: Diagnosis not present

## 2012-06-14 DIAGNOSIS — S91009A Unspecified open wound, unspecified ankle, initial encounter: Secondary | ICD-10-CM | POA: Diagnosis not present

## 2012-06-14 DIAGNOSIS — N138 Other obstructive and reflux uropathy: Secondary | ICD-10-CM | POA: Diagnosis present

## 2012-06-14 DIAGNOSIS — R079 Chest pain, unspecified: Secondary | ICD-10-CM | POA: Diagnosis not present

## 2012-06-14 DIAGNOSIS — R739 Hyperglycemia, unspecified: Secondary | ICD-10-CM | POA: Diagnosis present

## 2012-06-14 DIAGNOSIS — E876 Hypokalemia: Secondary | ICD-10-CM | POA: Diagnosis not present

## 2012-06-14 DIAGNOSIS — J9819 Other pulmonary collapse: Secondary | ICD-10-CM | POA: Diagnosis not present

## 2012-06-14 DIAGNOSIS — B0239 Other herpes zoster eye disease: Secondary | ICD-10-CM

## 2012-06-14 DIAGNOSIS — Z7982 Long term (current) use of aspirin: Secondary | ICD-10-CM

## 2012-06-14 DIAGNOSIS — K921 Melena: Secondary | ICD-10-CM

## 2012-06-14 DIAGNOSIS — I428 Other cardiomyopathies: Secondary | ICD-10-CM

## 2012-06-14 DIAGNOSIS — L259 Unspecified contact dermatitis, unspecified cause: Secondary | ICD-10-CM

## 2012-06-14 DIAGNOSIS — I798 Other disorders of arteries, arterioles and capillaries in diseases classified elsewhere: Secondary | ICD-10-CM | POA: Diagnosis not present

## 2012-06-14 DIAGNOSIS — N401 Enlarged prostate with lower urinary tract symptoms: Secondary | ICD-10-CM | POA: Diagnosis present

## 2012-06-14 DIAGNOSIS — M310 Hypersensitivity angiitis: Secondary | ICD-10-CM | POA: Diagnosis not present

## 2012-06-14 DIAGNOSIS — Z09 Encounter for follow-up examination after completed treatment for conditions other than malignant neoplasm: Secondary | ICD-10-CM | POA: Diagnosis not present

## 2012-06-14 DIAGNOSIS — A4189 Other specified sepsis: Secondary | ICD-10-CM | POA: Diagnosis not present

## 2012-06-14 DIAGNOSIS — R6521 Severe sepsis with septic shock: Secondary | ICD-10-CM | POA: Diagnosis present

## 2012-06-14 DIAGNOSIS — M7989 Other specified soft tissue disorders: Secondary | ICD-10-CM

## 2012-06-14 DIAGNOSIS — I359 Nonrheumatic aortic valve disorder, unspecified: Secondary | ICD-10-CM | POA: Diagnosis not present

## 2012-06-14 DIAGNOSIS — Z79899 Other long term (current) drug therapy: Secondary | ICD-10-CM

## 2012-06-14 DIAGNOSIS — R918 Other nonspecific abnormal finding of lung field: Secondary | ICD-10-CM | POA: Diagnosis not present

## 2012-06-14 DIAGNOSIS — R509 Fever, unspecified: Secondary | ICD-10-CM | POA: Diagnosis not present

## 2012-06-14 DIAGNOSIS — A419 Sepsis, unspecified organism: Secondary | ICD-10-CM | POA: Diagnosis present

## 2012-06-14 DIAGNOSIS — IMO0002 Reserved for concepts with insufficient information to code with codable children: Secondary | ICD-10-CM | POA: Diagnosis present

## 2012-06-14 DIAGNOSIS — N179 Acute kidney failure, unspecified: Secondary | ICD-10-CM | POA: Diagnosis not present

## 2012-06-14 DIAGNOSIS — L03116 Cellulitis of left lower limb: Secondary | ICD-10-CM

## 2012-06-14 DIAGNOSIS — I517 Cardiomegaly: Secondary | ICD-10-CM | POA: Diagnosis not present

## 2012-06-14 DIAGNOSIS — E1351 Other specified diabetes mellitus with diabetic peripheral angiopathy without gangrene: Secondary | ICD-10-CM | POA: Diagnosis present

## 2012-06-14 HISTORY — DX: Cellulitis of left lower limb: L03.116

## 2012-06-14 HISTORY — DX: Patient's noncompliance with other medical treatment and regimen: Z91.19

## 2012-06-14 HISTORY — DX: Contusion of unspecified lower leg, initial encounter: S80.10XA

## 2012-06-14 HISTORY — DX: Obstructive sleep apnea (adult) (pediatric): G47.33

## 2012-06-14 HISTORY — DX: Old myocardial infarction: I25.2

## 2012-06-14 HISTORY — DX: Patient's noncompliance with other medical treatment and regimen due to unspecified reason: Z91.199

## 2012-06-14 LAB — GLUCOSE, CAPILLARY
Glucose-Capillary: 441 mg/dL — ABNORMAL HIGH (ref 70–99)
Glucose-Capillary: 326 mg/dL — ABNORMAL HIGH (ref 70–99)
Glucose-Capillary: 259 mg/dL — ABNORMAL HIGH (ref 70–99)

## 2012-06-14 LAB — COMPREHENSIVE METABOLIC PANEL
ALT: 23 U/L (ref 0–53)
AST: 43 U/L — ABNORMAL HIGH (ref 0–37)
Albumin: 2.1 g/dL — ABNORMAL LOW (ref 3.5–5.2)
Alkaline Phosphatase: 83 U/L (ref 39–117)
BUN: 16 mg/dL (ref 6–23)
CO2: 16 mEq/L — ABNORMAL LOW (ref 19–32)
Calcium: 9.3 mg/dL (ref 8.4–10.5)
Calcium: 7.7 mg/dL — ABNORMAL LOW (ref 8.4–10.5)
Chloride: 104 mEq/L (ref 96–112)
Creatinine, Ser: 1.04 mg/dL (ref 0.50–1.35)
Creatinine, Ser: 1.01 mg/dL (ref 0.50–1.35)
GFR calc Af Amer: 83 mL/min — ABNORMAL LOW (ref >90)
GFR calc Af Amer: 86 mL/min — ABNORMAL LOW (ref >90)
GFR calc non Af Amer: 74 mL/min — ABNORMAL LOW (ref >90)
Glucose, Bld: 497 mg/dL — ABNORMAL HIGH (ref 70–99)
Glucose, Bld: 331 mg/dL — ABNORMAL HIGH (ref 70–99)
Potassium: 4.5 mEq/L (ref 3.5–5.1)
Total Bilirubin: 0.3 mg/dL (ref 0.3–1.2)
Total Protein: 6.9 g/dL (ref 6.0–8.3)

## 2012-06-14 LAB — CBC WITH DIFFERENTIAL/PLATELET
Basophils Absolute: 0 10*3/uL (ref 0.0–0.1)
Eosinophils Absolute: 0 10*3/uL (ref 0.0–0.7)
Lymphocytes Relative: 7 % — ABNORMAL LOW (ref 12–46)
Lymphs Abs: 1.4 10*3/uL (ref 0.7–4.0)
MCHC: 32.2 g/dL (ref 30.0–36.0)
MCV: 79 fL (ref 78.0–100.0)
Monocytes Relative: 5 % (ref 3–12)
Neutro Abs: 18.3 10*3/uL — ABNORMAL HIGH (ref 1.7–7.7)
Platelets: 426 10*3/uL — ABNORMAL HIGH (ref 150–400)
RDW: 15.5 % (ref 11.5–15.5)
WBC: 20.7 10*3/uL — ABNORMAL HIGH (ref 4.0–10.5)

## 2012-06-14 LAB — POCT I-STAT 3, ART BLOOD GAS (G3+)
Acid-base deficit: 13 mmol/L — ABNORMAL HIGH (ref 0.0–2.0)
Acid-base deficit: 8 mmol/L — ABNORMAL HIGH (ref 0.0–2.0)
Bicarbonate: 11.3 mEq/L — ABNORMAL LOW (ref 20.0–24.0)
Bicarbonate: 14.5 mEq/L — ABNORMAL LOW (ref 20.0–24.0)
O2 Saturation: 95 %
O2 Saturation: 96 %
Patient temperature: 98.6
pCO2 arterial: 36.2 mmHg (ref 35.0–45.0)
pH, Arterial: 7.323 — ABNORMAL LOW (ref 7.350–7.450)
pH, Arterial: 7.299 — ABNORMAL LOW (ref 7.350–7.450)
pO2, Arterial: 111 mmHg — ABNORMAL HIGH (ref 80.0–100.0)
pO2, Arterial: 70 mmHg — ABNORMAL LOW (ref 80.0–100.0)

## 2012-06-14 LAB — PHOSPHORUS: Phosphorus: 2.2 mg/dL — ABNORMAL LOW (ref 2.3–4.6)

## 2012-06-14 LAB — URINE MICROSCOPIC-ADD ON

## 2012-06-14 LAB — DIFFERENTIAL
Basophils Absolute: 0 10*3/uL (ref 0.0–0.1)
Eosinophils Absolute: 0 10*3/uL (ref 0.0–0.7)
Lymphocytes Relative: 10 % — ABNORMAL LOW (ref 12–46)
Neutrophils Relative %: 85 % — ABNORMAL HIGH (ref 43–77)

## 2012-06-14 LAB — URINALYSIS, ROUTINE W REFLEX MICROSCOPIC
Glucose, UA: >1000 mg/dL — AB
Ketones, ur: >80 mg/dL — AB
Leukocytes, UA: NEGATIVE
Leukocytes, UA: NEGATIVE
Nitrite: NEGATIVE
Nitrite: NEGATIVE
Specific Gravity, Urine: 1.029 (ref 1.005–1.030)
Specific Gravity, Urine: 1.028 (ref 1.005–1.030)
pH: 5.5 (ref 5.0–8.0)
pH: 5.5 (ref 5.0–8.0)

## 2012-06-14 LAB — AMYLASE: Amylase: 13 U/L (ref 0–105)

## 2012-06-14 LAB — CARDIAC PANEL(CRET KIN+CKTOT+MB+TROPI)
CK, MB: 11.5 ng/mL (ref 0.3–4.0)
Troponin I: 3.49 ng/mL (ref <0.30)

## 2012-06-14 LAB — CBC
Hemoglobin: 9 g/dL — ABNORMAL LOW (ref 13.0–17.0)
MCHC: 31.8 g/dL (ref 30.0–36.0)
RDW: 15.5 % (ref 11.5–15.5)

## 2012-06-14 LAB — APTT: aPTT: 38 seconds — ABNORMAL HIGH (ref 24–37)

## 2012-06-14 LAB — PRO B NATRIURETIC PEPTIDE: Pro B Natriuretic peptide (BNP): 24481 pg/mL — ABNORMAL HIGH (ref 0–125)

## 2012-06-14 LAB — PROTIME-INR: Prothrombin Time: 18.9 seconds — ABNORMAL HIGH (ref 11.6–15.2)

## 2012-06-14 LAB — MRSA PCR SCREENING: MRSA by PCR: NEGATIVE

## 2012-06-14 LAB — PROCALCITONIN: Procalcitonin: 7.6 ng/mL

## 2012-06-14 MED ORDER — SODIUM CHLORIDE 0.9 % IV SOLN
INTRAVENOUS | Status: DC
  Administered 2012-06-14: 9.9 [IU]/h via INTRAVENOUS
  Administered 2012-06-14: 3.4 [IU]/h via INTRAVENOUS
  Administered 2012-06-15: 11.5 [IU]/h via INTRAVENOUS
  Administered 2012-06-16 (×2): via INTRAVENOUS
  Administered 2012-06-17: 8.7 [IU]/h via INTRAVENOUS
  Filled 2012-06-14 (×9): qty 1

## 2012-06-14 MED ORDER — SODIUM CHLORIDE 0.9 % IV BOLUS (SEPSIS)
1000.0000 mL | Freq: Once | INTRAVENOUS | Status: AC
  Administered 2012-06-14: 1000 mL via INTRAVENOUS

## 2012-06-14 MED ORDER — ETOMIDATE 2 MG/ML IV SOLN
20.0000 mg | Freq: Once | INTRAVENOUS | Status: AC
  Administered 2012-06-14: 20 mg via INTRAVENOUS

## 2012-06-14 MED ORDER — MIDAZOLAM HCL 2 MG/2ML IJ SOLN
2.0000 mg | INTRAMUSCULAR | Status: DC | PRN
  Administered 2012-06-14 – 2012-06-17 (×6): 2 mg via INTRAVENOUS
  Filled 2012-06-14 (×4): qty 2

## 2012-06-14 MED ORDER — ACETAMINOPHEN 325 MG PO TABS
650.0000 mg | ORAL_TABLET | Freq: Once | ORAL | Status: AC
  Administered 2012-06-14: 650 mg via ORAL
  Filled 2012-06-14: qty 2

## 2012-06-14 MED ORDER — VANCOMYCIN HCL IN DEXTROSE 1-5 GM/200ML-% IV SOLN
1000.0000 mg | Freq: Two times a day (BID) | INTRAVENOUS | Status: DC
  Administered 2012-06-15: 1000 mg via INTRAVENOUS
  Filled 2012-06-14 (×3): qty 200

## 2012-06-14 MED ORDER — ROCURONIUM BROMIDE 50 MG/5ML IV SOLN
50.0000 mg | Freq: Once | INTRAVENOUS | Status: AC
  Administered 2012-06-14: 50 mg via INTRAVENOUS

## 2012-06-14 MED ORDER — HEPARIN SODIUM (PORCINE) 5000 UNIT/ML IJ SOLN
5000.0000 [IU] | Freq: Three times a day (TID) | INTRAMUSCULAR | Status: DC
  Administered 2012-06-14: 5000 [IU] via SUBCUTANEOUS
  Filled 2012-06-14 (×2): qty 1

## 2012-06-14 MED ORDER — INSULIN ASPART 100 UNIT/ML ~~LOC~~ SOLN: 0.0000 [IU] | SUBCUTANEOUS | Status: DC

## 2012-06-14 MED ORDER — PIPERACILLIN-TAZOBACTAM 3.375 G IVPB
3.3750 g | Freq: Three times a day (TID) | INTRAVENOUS | Status: DC
  Administered 2012-06-15 (×2): 3.375 g via INTRAVENOUS
  Filled 2012-06-14 (×4): qty 50

## 2012-06-14 MED ORDER — MIDAZOLAM HCL 2 MG/2ML IJ SOLN
INTRAMUSCULAR | Status: AC
  Administered 2012-06-14: 2 mg via INTRAVENOUS
  Filled 2012-06-14: qty 4

## 2012-06-14 MED ORDER — VANCOMYCIN HCL IN DEXTROSE 1-5 GM/200ML-% IV SOLN
1000.0000 mg | Freq: Once | INTRAVENOUS | Status: AC
  Administered 2012-06-14: 1000 mg via INTRAVENOUS

## 2012-06-14 MED ORDER — PANTOPRAZOLE SODIUM 40 MG IV SOLR: 40.0000 mg | Freq: Every day | INTRAVENOUS | Status: DC

## 2012-06-14 MED ORDER — VANCOMYCIN HCL IN DEXTROSE 1-5 GM/200ML-% IV SOLN
1000.0000 mg | Freq: Once | INTRAVENOUS | Status: AC
  Administered 2012-06-14: 1000 mg via INTRAVENOUS
  Filled 2012-06-14: qty 200

## 2012-06-14 MED ORDER — SODIUM CHLORIDE 0.9 % IV SOLN
Freq: Once | INTRAVENOUS | Status: AC
  Administered 2012-06-14: 14:00:00 via INTRAVENOUS

## 2012-06-14 MED ORDER — PANTOPRAZOLE SODIUM 40 MG IV SOLR
40.0000 mg | Freq: Every day | INTRAVENOUS | Status: DC
  Administered 2012-06-14 – 2012-06-17 (×4): 40 mg via INTRAVENOUS
  Filled 2012-06-14 (×5): qty 40

## 2012-06-14 MED ORDER — SODIUM CHLORIDE 0.9 % IV SOLN: INTRAVENOUS | Status: DC

## 2012-06-14 MED ORDER — VANCOMYCIN HCL 10 G IV SOLR: 1000.0000 mg | Freq: Once | INTRAVENOUS | Status: DC

## 2012-06-14 MED ORDER — ACETAMINOPHEN 160 MG/5ML PO SOLN
650.0000 mg | ORAL | Status: DC | PRN
  Administered 2012-06-14 – 2012-06-20 (×3): 650 mg
  Filled 2012-06-14 (×3): qty 20.3

## 2012-06-14 MED ORDER — SODIUM CHLORIDE 0.9 % IV SOLN
50.0000 ug/h | INTRAVENOUS | Status: DC
  Administered 2012-06-14: 50 ug/h via INTRAVENOUS
  Administered 2012-06-15 – 2012-06-17 (×3): 100 ug/h via INTRAVENOUS
  Filled 2012-06-14 (×4): qty 50

## 2012-06-14 MED ORDER — FENTANYL CITRATE 0.05 MG/ML IJ SOLN
INTRAMUSCULAR | Status: AC
  Administered 2012-06-14: 100 ug
  Filled 2012-06-14: qty 2

## 2012-06-14 MED ORDER — INSULIN REGULAR BOLUS VIA INFUSION
10.0000 [IU] | Freq: Once | INTRAVENOUS | Status: AC
  Administered 2012-06-14: 10 [IU] via INTRAVENOUS
  Filled 2012-06-14: qty 10

## 2012-06-14 MED ORDER — SODIUM CHLORIDE 0.9 % IV BOLUS (SEPSIS): 2000.0000 mL | Freq: Once | INTRAVENOUS | Status: DC

## 2012-06-14 MED ORDER — SODIUM CHLORIDE 0.9 % IV BOLUS (SEPSIS): 3000.0000 mL | Freq: Once | INTRAVENOUS | Status: DC

## 2012-06-14 MED ORDER — FENTANYL BOLUS VIA INFUSION
50.0000 ug | Freq: Four times a day (QID) | INTRAVENOUS | Status: DC | PRN
  Filled 2012-06-14: qty 100

## 2012-06-14 MED ORDER — DEXTROSE 10 % IV SOLN: INTRAVENOUS | Status: DC | PRN

## 2012-06-14 MED ORDER — SODIUM CHLORIDE 0.9 % IV SOLN
250.0000 mL | INTRAVENOUS | Status: DC | PRN
  Administered 2012-06-17: 250 mL via INTRAVENOUS
  Administered 2012-06-18: 500 mL via INTRAVENOUS
  Administered 2012-06-30: 250 mL via INTRAVENOUS

## 2012-06-14 MED ORDER — PIPERACILLIN-TAZOBACTAM 3.375 G IVPB 30 MIN
3.3750 g | Freq: Once | INTRAVENOUS | Status: AC
  Administered 2012-06-14: 3.375 g via INTRAVENOUS
  Filled 2012-06-14: qty 50

## 2012-06-14 NOTE — Consult Note (Signed)
Reason for Consult: Cellulitis posterior left knee Referring Physician: [name] is an 71 y.o. male.  HPI: Patient is a 71 year old gentleman who is status post a previous hematoma in the popliteal fossa months ago from acute trauma secondary to his anticoagulation. Patient's leg was resolving and get an improving significantly however over the past 4 days patient has had temperatures up to 102 decreased appetite and drainage with cellulitis swelling and redness and pain starting to the point where he cannot ambulate.  Past Medical History  Diagnosis Date  . DM 06/20/2009  . HYPERLIPIDEMIA 06/20/2009  . HYPERTENSION 06/20/2009  . CORONARY ATHEROSCLEROSIS NATIVE CORONARY ARTERY 01/27/2011    s/p PCI 2/12  . Herpes zoster ophthalmicus   . Arthritis   . Obesity   . GERD (gastroesophageal reflux disease)   . Ischemic cardiomyopathy 2/12    EF 31%  . Adenocarcinoma of prostate     s/p seed implants  . Polymyalgia rheumatica     Past Surgical History  Procedure Date  . Prostate surgery     cancer, seed implant  . Coronary stent placement     reports 6 stents placed    Family History  Problem Relation Age of Onset  . Cancer Mother 26    unknown CA  . Heart disease Father 78    MI, died  . Alcohol abuse Father     Social History:  reports that he quit smoking about 23 years ago. His smoking use included Cigarettes. He has a 6 pack-year smoking history. He has never used smokeless tobacco. He reports that he does not drink alcohol or use illicit drugs.  Allergies:  Allergies  Allergen Reactions  . Atorvastatin     REACTION: sore legs    Medications: I have reviewed the patient's current medications.  Results for orders placed during the hospital encounter of 06/14/12 (from the past 48 hour(s))  CBC WITH DIFFERENTIAL     Status: Abnormal   Collection Time   06/14/12  1:07 PM      Component Value Range Comment   WBC 20.7 (*) 4.0 - 10.5 K/uL    RBC 4.29  4.22 -  5.81 MIL/uL    Hemoglobin 10.9 (*) 13.0 - 17.0 g/dL    HCT 40.9 (*) 81.1 - 52.0 %    MCV 79.0  78.0 - 100.0 fL    MCH 25.4 (*) 26.0 - 34.0 pg    MCHC 32.2  30.0 - 36.0 g/dL    RDW 91.4  78.2 - 95.6 %    Platelets 426 (*) 150 - 400 K/uL    Neutrophils Relative 88 (*) 43 - 77 %    Lymphocytes Relative 7 (*) 12 - 46 %    Monocytes Relative 5  3 - 12 %    Eosinophils Relative 0  0 - 5 %    Basophils Relative 0  0 - 1 %    Neutro Abs 18.3 (*) 1.7 - 7.7 K/uL    Lymphs Abs 1.4  0.7 - 4.0 K/uL    Monocytes Absolute 1.0  0.1 - 1.0 K/uL    Eosinophils Absolute 0.0  0.0 - 0.7 K/uL    Basophils Absolute 0.0  0.0 - 0.1 K/uL    WBC Morphology TOXIC GRANULATION      Smear Review     LARGE PLATELETS PRESENT   Value: PLATELET CLUMPS NOTED ON SMEAR, COUNT APPEARS ADEQUATE  COMPREHENSIVE METABOLIC PANEL     Status: Abnormal  Collection Time   06/14/12  1:07 PM      Component Value Range Comment   Sodium 132 (*) 135 - 145 mEq/L    Potassium 4.5  3.5 - 5.1 mEq/L    Chloride 93 (*) 96 - 112 mEq/L    CO2 12 (*) 19 - 32 mEq/L    Glucose, Bld 497 (*) 70 - 99 mg/dL    BUN 16  6 - 23 mg/dL    Creatinine, Ser 1.61  0.50 - 1.35 mg/dL    Calcium 9.3  8.4 - 09.6 mg/dL    Total Protein 6.9  6.0 - 8.3 g/dL    Albumin 2.1 (*) 3.5 - 5.2 g/dL    AST 36  0 - 37 U/L    ALT 27  0 - 53 U/L    Alkaline Phosphatase 83  39 - 117 U/L    Total Bilirubin 0.5  0.3 - 1.2 mg/dL    GFR calc non Af Amer 72 (*) >90 mL/min    GFR calc Af Amer 83 (*) >90 mL/min   URINALYSIS, ROUTINE W REFLEX MICROSCOPIC     Status: Abnormal   Collection Time   06/14/12  1:59 PM      Component Value Range Comment   Color, Urine YELLOW  YELLOW    APPearance HAZY (*) CLEAR    Specific Gravity, Urine 1.029  1.005 - 1.030    pH 5.5  5.0 - 8.0    Glucose, UA >1000 (*) NEGATIVE mg/dL    Hgb urine dipstick LARGE (*) NEGATIVE    Bilirubin Urine SMALL (*) NEGATIVE    Ketones, ur >80 (*) NEGATIVE mg/dL    Protein, ur 045 (*) NEGATIVE mg/dL     Urobilinogen, UA 0.2  0.0 - 1.0 mg/dL    Nitrite NEGATIVE  NEGATIVE    Leukocytes, UA NEGATIVE  NEGATIVE   URINE MICROSCOPIC-ADD ON     Status: Abnormal   Collection Time   06/14/12  1:59 PM      Component Value Range Comment   Squamous Epithelial / LPF RARE  RARE    WBC, UA 0-2  <3 WBC/hpf    RBC / HPF 3-6  <3 RBC/hpf RESULT CHECKED   Bacteria, UA RARE  RARE    Casts GRANULAR CAST (*) NEGATIVE   GLUCOSE, CAPILLARY     Status: Abnormal   Collection Time   06/14/12  2:09 PM      Component Value Range Comment   Glucose-Capillary 441 (*) 70 - 99 mg/dL    Comment 1 Documented in Chart      Comment 2 Notify RN     GLUCOSE, CAPILLARY     Status: Abnormal   Collection Time   06/14/12  3:32 PM      Component Value Range Comment   Glucose-Capillary 398 (*) 70 - 99 mg/dL    Comment 1 Documented in Chart      Comment 2 Notify RN     POCT I-STAT 3, BLOOD GAS (G3+)     Status: Abnormal   Collection Time   06/14/12  3:55 PM      Component Value Range Comment   pH, Arterial 7.323 (*) 7.350 - 7.450    pCO2 arterial 21.8 (*) 35.0 - 45.0 mmHg    pO2, Arterial 111.0 (*) 80.0 - 100.0 mmHg    Bicarbonate 11.3 (*) 20.0 - 24.0 mEq/L    TCO2 12  0 - 100 mmol/L    O2 Saturation 98.0  Acid-base deficit 13.0 (*) 0.0 - 2.0 mmol/L    Collection site RADIAL, ALLEN'S TEST ACCEPTABLE      Drawn by Operator      Sample type ARTERIAL     CARDIAC PANEL(CRET KIN+CKTOT+MB+TROPI)     Status: Abnormal   Collection Time   06/14/12  4:34 PM      Component Value Range Comment   Total CK 255 (*) 7 - 232 U/L    CK, MB 11.5 (*) 0.3 - 4.0 ng/mL    Troponin I 3.49 (*) <0.30 ng/mL    Relative Index 4.5 (*) 0.0 - 2.5   PROCALCITONIN     Status: Normal   Collection Time   06/14/12  4:34 PM      Component Value Range Comment   Procalcitonin 7.60     LACTIC ACID, PLASMA     Status: Abnormal   Collection Time   06/14/12  4:35 PM      Component Value Range Comment   Lactic Acid, Venous 3.9 (*) 0.5 - 2.2 mmol/L   GLUCOSE,  CAPILLARY     Status: Abnormal   Collection Time   06/14/12  5:08 PM      Component Value Range Comment   Glucose-Capillary 374 (*) 70 - 99 mg/dL    Comment 1 Notify RN      Comment 2 Documented in Chart       Dg Chest Port 1 View  06/14/2012  *RADIOLOGY REPORT*  Clinical Data: Evaluate IJ catheter placement  PORTABLE CHEST - 1 VIEW  Comparison: 06/14/2012  Findings:  Right IJ catheter tip is in the SVC.  No pneumothorax identified.  Heart size is mildly enlarged.  No pleural effusion or edema.  No airspace consolidation.  IMPRESSION: The right IJ catheter is in satisfactory position within the SVC. No pneumothorax identified.  Original Report Authenticated By: Rosealee Albee, M.D.   Dg Chest Portable 1 View  06/14/2012  *RADIOLOGY REPORT*  Clinical Data: Shortness of breath, chest pain, tachycardia.  PORTABLE CHEST - 1 VIEW  Comparison: 04/11/2012.  Findings: Trachea is midline.  Heart size stable.  Lungs are somewhat low in volume with mild interstitial prominence and indistinctness.  Possible small left pleural effusion.  Biapical pleural thickening with calcification.  IMPRESSION: Suspect mild edema.  Possible tiny left pleural effusion.  Original Report Authenticated By: Reyes Ivan, M.D.    Review of Systems  All other systems reviewed and are negative.   Blood pressure 87/49, pulse 123, temperature 99.4 F (37.4 C), temperature source Oral, resp. rate 28, height 5\' 10"  (1.778 m), weight 102.7 kg (226 lb 6.6 oz), SpO2 100.00%. Physical Exam On examination patient has cellulitis and swelling of the left lower extremity with cellulitis proximal and distal to the popliteal fossa. There is no drainage no signs of a compartment syndrome. Assessment/Plan: Assessment: Acute cellulitis abscess left popliteal fossa.  Plan: Will plan for surgical debridement of the popliteal fossa. Risks and benefits of surgery were discussed patient and his wife state they understand and wish to proceed  at this time we'll plan for n.p.o. after midnight with surgery after 5 PM tomorrow  Wayne [name] 06/14/2012, 6:34 PM

## 2012-06-14 NOTE — ED Notes (Signed)
Admitting MD at bedside.

## 2012-06-14 NOTE — Procedures (Signed)
Staff note:   - supervised procedure  Dr. Kalman Shan, M.D., [facility].C.P Pulmonary and Critical Care Medicine Staff Physician [facility] Pager: [phone], If no answer or between  15:00h - 7:00h: call [phone]  06/14/2012 5:20 PM

## 2012-06-14 NOTE — H&P (Addendum)
Name: Wayne [name] MRN: [id number] DOB: August 29, 1941    LOS: 0 Date of admit 06/14/2012 12:48 PM PCP is Kristian Covey, MD Ortho is Dr Jonathon Bellows  Referring Provider:  Dr Preston Fleeting of ER Reason for Referral:  Sepsis, hypotension, acidsis , hyperglycemia  PULMONARY / CRITICAL CARE MEDICINE  HPI:    71 year old obee male with CAD s/p stents, EF 45% in feb 2013, PMR, Prostatec ancer  Hx. He appears to have 3 month hx of chronic redness and swelling and drainage from his left lower leg and is being followed by Dr. Lajoyce Corners regarding that. It appears that he and wife prefer conservative mgmt for the same and is followed by DR Jonathon Bellows for the same. He walks with crutches only. On 06/12/12 noticed increasing and had increased redness of th LLE cellulitis spreading both proximally into th knee area and distally to ankle area. Then on 06/13/12, he had a shaking chill. Today 06/14/2012 12:48 PM , he developed a fever to 102 and became confused. In the ER noted to be hypotensive and needed 3-4 Liter fluid to normalized bp, acidotic metabolic and resultant labored respirations and confusion improved with fluids. PCCM admitting 06/14/2012 12:48 PM   Note: He had blood in his urine last week and was given some medication by his urologist which made him break out in a rash and he was switched to a new medication. He does not know which medication he had received which caused him to break out in a rash.   Note: He has chronic pain in the left lower leg which is unchanged. Pain is worse with any movement of the leg and it is rated at 10/10. Dyspnea.  Note: wife dominant in relationshiop and over rode his wish for no cpr but both in unison that amputation is not an option/last resort option   Past Medical History  Diagnosis Date  . DM 06/20/2009  . HYPERLIPIDEMIA 06/20/2009  . HYPERTENSION 06/20/2009  . CORONARY ATHEROSCLEROSIS NATIVE CORONARY ARTERY 01/27/2011    s/p PCI 2/12  . Herpes zoster ophthalmicus   . Arthritis   .  Obesity   . GERD (gastroesophageal reflux disease)   . Ischemic cardiomyopathy 2/12    EF 31%  . Adenocarcinoma of prostate     s/p seed implants  . Polymyalgia rheumatica    Past Surgical History  Procedure Date  . Prostate surgery     cancer, seed implant  . Coronary stent placement     reports 6 stents placed   Prior to Admission medications   Medication Sig Start Date End Date Taking? Authorizing Provider  aspirin EC 81 MG tablet Take 81 mg by mouth daily.   Yes Historical Provider, MD  glimepiride (AMARYL) 4 MG tablet Take 4 mg by mouth daily before breakfast.   Yes Historical Provider, MD  HYDROcodone-acetaminophen (VICODIN) 5-500 MG per tablet Take 1 tablet by mouth every 6 (six) hours as needed. For pain.   Yes Historical Provider, MD  metFORMIN (GLUCOPHAGE) 500 MG tablet Take 500 mg by mouth 2 (two) times daily with a meal.   Yes Historical Provider, MD  nitroGLYCERIN (NITROSTAT) 0.4 MG SL tablet Place 0.4 mg under the tongue every 5 (five) minutes as needed. For chest pain.   Yes Historical Provider, MD  prasugrel (EFFIENT) 10 MG TABS Take 10 mg by mouth daily.   Yes Historical Provider, MD   Allergies Allergies  Allergen Reactions  . Atorvastatin     REACTION: sore  legs    Family History Family History  Problem Relation Age of Onset  . Cancer Mother 69    unknown CA  . Heart disease Father 64    MI, died  . Alcohol abuse Father    Social History  reports that he quit smoking about 23 years ago. His smoking use included Cigarettes. He has a 6 pack-year smoking history. He has never used smokeless tobacco. He reports that he does not drink alcohol or use illicit drugs.  Review Of Systems:  As in HPI  Brief patient description:  hpi  Events Since Admission: 06/14/2012 12:48 PM - admit   Current Status:    Vital Signs: Temp:  [99.4 F (37.4 C)] 99.4 F (37.4 C) (07/01 1246) Pulse Rate:  [110-154] 131  (07/01 1600) Resp:  [25-36] 35  (07/01 1600) BP:  (86-122)/(47-78) 108/61 mmHg (07/01 1600) SpO2:  [87 %-100 %] 95 % (07/01 1600)  Physical Examination: General:  Obese male. InER bed. Looks critically ill Neuro:  Curerntly RASS 0. CAm-ICu neg for delirium. GCS 15. Moves all 4s.  HEENT:  PEERL + Neck:  No elevated jvp Cardiovascular:  Tachycardic, hypotensive, Normal heart sounds Lungs:  Tachypneic, RR 30, Not paradoxical Abdomen:  Obese, soft, normal bowel sounds Musculoskeletal:  LLE - chronic edema, red, brawn, redness knee to ankgle, tender (increased pain but wife says reduced swelling), no crepitus Skin:  Otherwise itnact  Principal Problem:  *Severe sepsis Active Problems:  Metabolic acidosis  Cellulitis and abscess of lower leg  Acute respiratory failure  Healthcare-associated pneumonia  Hyperglycemia  Acute encephalopathy  CORONARY ATHEROSCLEROSIS NATIVE CORONARY ARTERY  Polymyalgia rheumatica  Systolic CHF, chronic  OSA (obstructive sleep apnea)  HYPERTROPHY PROSTATE W/UR OBST & OTH LUTS  Obesity   ASSESSMENT AND PLAN  PULMONARY  Lab 06/14/12 1555  PHART 7.323*  PCO2ART 21.8*  PO2ART 111.0*  HCO3 11.3*  O2SAT 98.0   Ventilator Settings:   CXR:  06/14/2012 12:48 PM  - LL consolidation  ETT:  none  A:  Acute REsp Distress due to metabolic acidosis +/- LLL PNA P:   Oxygen for pulse ox > 92% Monitor closely  Bipap/ Intubate if needed Rx infection  CARDIOVASCULAR No results found for this basename: TROPONINI:5,LATICACIDVEN:5, O2SATVEN:5,PROBNP:5 in the last 168 hours ECG:  ? Lines: PIV (CVL planned)   A: SEvere sepsis and hypotension - bp improved after 3L fluid in ER P:  Continue fluid resus- 2 more liters (wary ef 45%) Place cvl If hyptensive, start edgt  RENAL  Lab 06/14/12 1307  NA 132*  K 4.5  CL 93*  CO2 12*  BUN 16  CREATININE 1.04  CALCIUM 9.3  MG --  PHOS --   Intake/Output    None    Foley: not placed at admit 06/14/2012 12:48 PM   A:  Severe metabolic acidosis, suspect  due to sepsis P:   Fluid hydraation Check lactate Monitor   GASTROINTESTINAL  Lab 06/14/12 1307  AST 36  ALT 27  ALKPHOS 83  BILITOT 0.5  PROT 6.9  ALBUMIN 2.1*    A:  None P:   Keep npo except meds  HEMATOLOGIC  Lab 06/14/12 1307  HGB 10.9*  HCT 33.9*  PLT 426*  INR --  APTT --   A:  Anemia of chrnic and critical illness P:  - PRBC for hgb </= 6.9gm%    - exceptions are   -  if ACS susepcted/confirmed then transfuse for hgb </= 8.0gm%,  or    -  If septic shock first 24h and scvo2 < 70% then transfuse for hgb </= 9.0gm%   - active bleeding with hemodynamic instability, then transfuse regardless of hemoglobin value   At at all times try to transfuse 1 unit prbc as possible with exception of active hemorrhage    INFECTIOUS  Lab 06/14/12 1307  WBC 20.7*  PROCALCITON --   Cultures: Results for orders placed during the hospital encounter of 04/17/12  URINE CULTURE     Status: Normal   Collection Time   04/17/12 12:07 PM      Component Value Range Status Comment   Specimen Description URINE, CATHETERIZED   Final    Special Requests NONE   Final    Culture  Setup Time [id number]   Final    Colony Count NO GROWTH   Final    Culture NO GROWTH   Final    Report Status 04/18/2012 FINAL   Final     Antibiotics: Anti-infectives     Start     Dose/Rate Route Frequency Ordered Stop   06/14/12 1615   vancomycin (VANCOCIN) IVPB 1000 mg/200 mL premix        1,000 mg 200 mL/hr over 60 Minutes Intravenous  Once 06/14/12 1608     06/14/12 1615  piperacillin-tazobactam (ZOSYN) IVPB 3.375 g       3.375 g 100 mL/hr over 30 Minutes Intravenous  Once 06/14/12 1608     06/14/12 1600   vancomycin (VANCOCIN) IVPB 1000 mg/200 mL premix        1,000 mg 200 mL/hr over 60 Minutes Intravenous  Once 06/14/12 1547     06/14/12 1545   vancomycin (VANCOCIN) injection 1,000 mg  Status:  Discontinued        1,000 mg Intravenous  Once 06/14/12 1532 06/14/12 1547            A:  Cellulitis LLE andLL PNA as possible/likely sources of severe sepsis P:   Panc culture Chk sepsis biomarkers Vanc Zosyn MRI LLE (d/w Dr Lajoyce Corners)  ENDOCRINE  Lab 06/14/12 1532 06/14/12 1409  GLUCAP 398* 441*   A:  Hyperglycemia likely reaction to sepsis and not DKA . Known diabetic on oral agents P:   No oral agents in hospital ICU hyperglycemia protocol  NEUROLOGIC  A:  Confused at admit. Normalized with fluids and bp increase P:   Monitor  GLOBAL  - he said no cpr but wife over-ruled him. Both want short term ventilaton. He said he would rather die rather than amputation; wife seems to concur but both do not want to rush to any decision. IF surgery for leg or amputation ever recommended, they want a "team of doctors"saying the same thing. Full Code for now but not interested in chronic critical illness  BEST PRACTICE / DISPOSITION Level of Care:  icu Primary Service:  pccm Consultants:  Ortho Dr Lajoyce Corners  Code Status:  Full  Diet:  npo DVT Px:  Sq heparin GI Px:  ppi Skin Integrity:  Intact other than LLE Social / Family:  Wife dpoa   The patient is critically ill with multiple organ systems failure and requires high complexity decision making for assessment and support, frequent evaluation and titration of therapies, application of advanced monitoring technologies and extensive interpretation of multiple databases.   Critical Care Time devoted to patient care services described in this note is  60  Minutes.  Dr. Kalman Shan, M.D., [facility]C.P Pulmonary and Critical Care Medicine Staff Physician [facility] [place]  Pulmonary and Critical Care Pager: [phone], If no answer or between  15:00h - 7:00h: call [phone]  06/14/2012 4:43 PM

## 2012-06-14 NOTE — ED Provider Notes (Signed)
History     CSN: [id number]  Arrival date & time 06/14/12  1248   First MD Initiated Contact with Patient 06/14/12 1258      Chief Complaint  Patient presents with  . Altered Mental Status  . Hyperglycemia  . Cellulitis    (Consider location/radiation/quality/duration/timing/severity/associated sxs/prior treatment) Patient is a 71 y.o. male presenting with altered mental status. The history is provided by the patient and the spouse.  Altered Mental Status  He has chronic redness and swelling and drainage from his left lower leg and is being followed by Dr. Lajoyce Corners regarding that. Yesterday, he had a shaking chill. Today, he developed a fever to 102 and became confused. He had blood in his urine last week and was given some medication by his urologist which made him break out in a rash and he was switched to a new medication. He does not know which medication he had received which caused him to break out in a rash. He has chronic pain in the left lower leg which is unchanged. Pain is worse with any movement of the leg and it is rated at 10/10. There's been no cough or dyspnea.  Past Medical History  Diagnosis Date  . DM 06/20/2009  . HYPERLIPIDEMIA 06/20/2009  . HYPERTENSION 06/20/2009  . CORONARY ATHEROSCLEROSIS NATIVE CORONARY ARTERY 01/27/2011    s/p PCI 2/12  . Herpes zoster ophthalmicus   . Arthritis   . Obesity   . GERD (gastroesophageal reflux disease)   . Ischemic cardiomyopathy 2/12    EF 31%  . Adenocarcinoma of prostate     s/p seed implants  . Polymyalgia rheumatica     Past Surgical History  Procedure Date  . Prostate surgery     cancer, seed implant  . Coronary stent placement     reports 6 stents placed    Family History  Problem Relation Age of Onset  . Cancer Mother 54    unknown CA  . Heart disease Father 34    MI, died  . Alcohol abuse Father     History  Substance Use Topics  . Smoking status: Former Smoker -- 0.3 packs/day for 20 years    Types:  Cigarettes    Quit date: 02/23/1989  . Smokeless tobacco: Never Used  . Alcohol Use: No      Review of Systems  Psychiatric/Behavioral: Positive for altered mental status.  All other systems reviewed and are negative.    Allergies  Atorvastatin  Home Medications   Current Outpatient Rx  Name Route Sig Dispense Refill  . ASPIRIN EC 81 MG PO TBEC Oral Take 81 mg by mouth daily.    Marland Kitchen GLIMEPIRIDE 4 MG PO TABS Oral Take 4 mg by mouth daily before breakfast.    . HYDROCODONE-ACETAMINOPHEN 5-500 MG PO TABS Oral Take 1 tablet by mouth every 6 (six) hours as needed. For pain.    Marland Kitchen METFORMIN HCL 500 MG PO TABS Oral Take 500 mg by mouth 2 (two) times daily with a meal.    . NITROGLYCERIN 0.4 MG SL SUBL Sublingual Place 0.4 mg under the tongue every 5 (five) minutes as needed. For chest pain.    Marland Kitchen PRASUGREL HCL 10 MG PO TABS Oral Take 10 mg by mouth daily.    Marland Kitchen CARVEDILOL 12.5 MG PO TABS Oral Take 18.75 mg by mouth 2 (two) times daily with a meal.    . CO-ENZYME Q-10 100 MG PO CAPS Oral Take 200 mg by mouth daily.    Marland Kitchen  DIGOXIN 0.125 MG PO TABS Oral Take 125 mcg by mouth daily.    . FUROSEMIDE 20 MG PO TABS Oral Take 20 mg by mouth every other day.    Marland Kitchen LISINOPRIL 10 MG PO TABS Oral Take 10 mg by mouth 2 (two) times daily.    . ADULT MULTIVITAMIN W/MINERALS CH Oral Take 1 tablet by mouth daily.    Marland Kitchen PRAVASTATIN SODIUM 40 MG PO TABS Oral Take 40 mg by mouth every evening.    Marland Kitchen SPIRONOLACTONE 25 MG PO TABS Oral Take 25 mg by mouth daily.    . TRIAMCINOLONE ACETONIDE 0.5 % EX CREA Topical Apply 1 application topically 2 (two) times daily.      BP 121/78  Pulse 154  Temp 99.4 F (37.4 C) (Oral)  Resp 28  SpO2 98%  Physical Exam  Nursing note and vitals reviewed.  71 year old male who is awake and alert and in no acute distress. Vital signs are significant for tachycardia with heart rate 154, tachypnea with respiratory rate of 28. Oxygen saturation is 98% which is normal. Head is  normocephalic and atraumatic. PERRLA, EOMI. Neck is nontender and supple. Back is nontender. Lungs are clear without rales, wheezes, rhonchi. Heart has regular rate and rhythm which is tachycardic. No murmurs heard. Abdomen is soft, flat, nontender without masses or hepatosplenomegaly. Extremities: There is trace edema on the right and 1+ edema on the left. Left lower leg is erythematous and tender diffusely with some weeping. Skin is warm and dry without other rash. Neurologic: He is awake and alert. He is oriented to person but not place or time. Cranial nerves are intact. There are no motor or sensory deficits.  ED Course  Procedures (including critical care time)  Results for orders placed during the hospital encounter of 06/14/12  CBC WITH DIFFERENTIAL      Component Value Range   WBC 20.7 (*) 4.0 - 10.5 K/uL   RBC 4.29  4.22 - 5.81 MIL/uL   Hemoglobin 10.9 (*) 13.0 - 17.0 g/dL   HCT 11.9 (*) 14.7 - 82.9 %   MCV 79.0  78.0 - 100.0 fL   MCH 25.4 (*) 26.0 - 34.0 pg   MCHC 32.2  30.0 - 36.0 g/dL   RDW 56.2  13.0 - 86.5 %   Platelets 426 (*) 150 - 400 K/uL   Neutrophils Relative 88 (*) 43 - 77 %   Lymphocytes Relative 7 (*) 12 - 46 %   Monocytes Relative 5  3 - 12 %   Eosinophils Relative 0  0 - 5 %   Basophils Relative 0  0 - 1 %   Neutro Abs 18.3 (*) 1.7 - 7.7 K/uL   Lymphs Abs 1.4  0.7 - 4.0 K/uL   Monocytes Absolute 1.0  0.1 - 1.0 K/uL   Eosinophils Absolute 0.0  0.0 - 0.7 K/uL   Basophils Absolute 0.0  0.0 - 0.1 K/uL   WBC Morphology TOXIC GRANULATION     Smear Review       Value: PLATELET CLUMPS NOTED ON SMEAR, COUNT APPEARS ADEQUATE  COMPREHENSIVE METABOLIC PANEL      Component Value Range   Sodium 132 (*) 135 - 145 mEq/L   Potassium 4.5  3.5 - 5.1 mEq/L   Chloride 93 (*) 96 - 112 mEq/L   CO2 12 (*) 19 - 32 mEq/L   Glucose, Bld 497 (*) 70 - 99 mg/dL   BUN 16  6 - 23 mg/dL   Creatinine, Ser  1.04  0.50 - 1.35 mg/dL   Calcium 9.3  8.4 - 30.8 mg/dL   Total Protein 6.9  6.0  - 8.3 g/dL   Albumin 2.1 (*) 3.5 - 5.2 g/dL   AST 36  0 - 37 U/L   ALT 27  0 - 53 U/L   Alkaline Phosphatase 83  39 - 117 U/L   Total Bilirubin 0.5  0.3 - 1.2 mg/dL   GFR calc non Af Amer 72 (*) >90 mL/min   GFR calc Af Amer 83 (*) >90 mL/min  URINALYSIS, ROUTINE W REFLEX MICROSCOPIC      Component Value Range   Color, Urine YELLOW  YELLOW   APPearance HAZY (*) CLEAR   Specific Gravity, Urine 1.029  1.005 - 1.030   pH 5.5  5.0 - 8.0   Glucose, UA >1000 (*) NEGATIVE mg/dL   Hgb urine dipstick LARGE (*) NEGATIVE   Bilirubin Urine SMALL (*) NEGATIVE   Ketones, ur >80 (*) NEGATIVE mg/dL   Protein, ur 657 (*) NEGATIVE mg/dL   Urobilinogen, UA 0.2  0.0 - 1.0 mg/dL   Nitrite NEGATIVE  NEGATIVE   Leukocytes, UA NEGATIVE  NEGATIVE  GLUCOSE, CAPILLARY      Component Value Range   Glucose-Capillary 441 (*) 70 - 99 mg/dL   Comment 1 Documented in Chart     Comment 2 Notify RN    URINE MICROSCOPIC-ADD ON      Component Value Range   Squamous Epithelial / LPF RARE  RARE   WBC, UA 0-2  <3 WBC/hpf   RBC / HPF 3-6  <3 RBC/hpf   Bacteria, UA RARE  RARE   Casts GRANULAR CAST (*) NEGATIVE  GLUCOSE, CAPILLARY      Component Value Range   Glucose-Capillary 398 (*) 70 - 99 mg/dL   Comment 1 Documented in Chart     Comment 2 Notify RN     Dg Chest Portable 1 View  06/14/2012  *RADIOLOGY REPORT*  Clinical Data: Shortness of breath, chest pain, tachycardia.  PORTABLE CHEST - 1 VIEW  Comparison: 04/11/2012.  Findings: Trachea is midline.  Heart size stable.  Lungs are somewhat low in volume with mild interstitial prominence and indistinctness.  Possible small left pleural effusion.  Biapical pleural thickening with calcification.  IMPRESSION: Suspect mild edema.  Possible tiny left pleural effusion.  Original Report Authenticated By: Reyes Ivan, M.D.      Date: 06/14/2012  Rate: 150  Rhythm: sinus tachycardia  QRS Axis: normal  Intervals: normal  ST/T Wave abnormalities: nonspecific ST/T  changes  Conduction Disutrbances:none  Narrative Interpretation: Sinus tachycardia with nonspecific ST and T changes. No old ECG available for comparison.  Old EKG Reviewed: none available    1. DKA (diabetic ketoacidoses)   2. Cellulitis of left lower leg    CRITICAL CARE Performed by: QIONG,EXBMW   Total critical care time: 55 minutes  Critical care time was exclusive of separately billable procedures and treating other patients.  Critical care was necessary to treat or prevent imminent or life-threatening deterioration.  Critical care was time spent personally by me on the following activities: development of treatment plan with patient and/or surrogate as well as nursing, discussions with consultants, evaluation of patient's response to treatment, examination of patient, obtaining history from patient or surrogate, ordering and performing treatments and interventions, ordering and review of laboratory studies, ordering and review of radiographic studies, pulse oximetry and re-evaluation of patient's condition.    MDM  Fever with confusion.  Most likely sources cellulitis related to his left lower leg. However, need to consider possibility of urinary tract infection and pneumonia. Chest x-ray will be obtained as well as urinalysis as well as routine laboratory testing. Old records are reviewed and he is noted to have normal renal function. He was given a 1lliter bolus of normal saline. CBG was also noted to be elevated and he needs to be evaluated for possible ketoacidosis.  Workup is negative for other sources of infection. WBC is come back markedly elevated and electrolytes confirm presence of ketoacidosis with an elevated anion gap of 27. He is started on an insulin drip. Blood pressure has dropped and he is given additional fluids. Case is discussed with Dr. you can of pulmonary critical care medicine who agrees to admit the patient. He is started on vancomycin for presumed  cellulitis.      Dione Booze, MD 06/14/12 1535

## 2012-06-14 NOTE — Progress Notes (Addendum)
ANTIBIOTIC CONSULT NOTE - INITIAL  Pharmacy Consult for vancomycin + zosyn Indication: rule out sepsis, PNA, cellulitis  Allergies  Allergen Reactions  . Atorvastatin     REACTION: sore legs    Patient Measurements: Last weight = 105kg  Vital Signs: Temp: 99.4 F (37.4 C) (07/01 1246) Temp src: Oral (07/01 1246) BP: 108/61 mmHg (07/01 1600) Pulse Rate: 131  (07/01 1600) Intake/Output from previous day:   Intake/Output from this shift:    Labs:  Basename 06/14/12 1307  WBC 20.7*  HGB 10.9*  PLT 426*  LABCREA --  CREATININE 1.04   The CrCl is unknown because both a height and weight (above a minimum accepted value) are required for this calculation. No results found for this basename: VANCOTROUGH:2,VANCOPEAK:2,VANCORANDOM:2,GENTTROUGH:2,GENTPEAK:2,GENTRANDOM:2,TOBRATROUGH:2,TOBRAPEAK:2,TOBRARND:2,AMIKACINPEAK:2,AMIKACINTROU:2,AMIKACIN:2, in the last 72 hours   Microbiology: No results found for this or any previous visit (from the past 720 hour(s)).  Medical History: Past Medical History  Diagnosis Date  . DM 06/20/2009  . HYPERLIPIDEMIA 06/20/2009  . HYPERTENSION 06/20/2009  . CORONARY ATHEROSCLEROSIS NATIVE CORONARY ARTERY 01/27/2011    s/p PCI 2/12  . Herpes zoster ophthalmicus   . Arthritis   . Obesity   . GERD (gastroesophageal reflux disease)   . Ischemic cardiomyopathy 2/12    EF 31%  . Adenocarcinoma of prostate     s/p seed implants  . Polymyalgia rheumatica    Assessment: 45 yom presented to the ED with AMS since yesterday along with fever and hyperglycemia/DKA. Also noted to have swelling and redness on his LLE. Pt to start vancomycin + zosyn empirically for sepsis.   Goal of Therapy:  Vancomycin trough level 15-20 mcg/ml  Plan:  1. Vancomycin 2gm total load  2. Vancomycin 1gm IV Q12H 3. Zosyn 3.375gm IV Q8H (4 hour infusion) 4. F/u renal function, cultures & sensitivities and trough at steady state  Wayne [name], Wayne [name] 06/14/2012,4:15  PM

## 2012-06-14 NOTE — Procedures (Signed)
Central Venous Catheter Insertion Procedure Note Wayne [name] [id number] 18-Aug-1941  Procedure: Insertion of Central Venous Catheter Indications: Assessment of intravascular volume, Drug and/or fluid administration and Frequent blood sampling  Procedure Details Consent: Risks of procedure as well as the alternatives and risks of each were explained to the (patient/caregiver).  Consent for procedure obtained. Time Out: Verified patient identification, verified procedure, site/side was marked, verified correct patient position, special equipment/implants available, medications/allergies/relevent history reviewed, required imaging and test results available.  Performed  Maximum sterile technique was used including antiseptics, cap, gloves, gown, hand hygiene, mask and sheet. Skin prep: Chlorhexidine; local anesthetic administered A antimicrobial bonded/coated triple lumen catheter was placed in the right internal jugular vein using the Seldinger technique.  Evaluation Blood flow good Complications: No apparent complications Patient did tolerate procedure well. Chest X-ray ordered to verify placement.  CXR: pending.  Procedure performed under direct supervision of Dr. Marchelle Gearing and with ultrasound guidance.  Catheter placed at 17cm

## 2012-06-14 NOTE — ED Notes (Signed)
Pt being transported to ICU with Lorin Picket RN

## 2012-06-14 NOTE — ED Notes (Signed)
Transport delayed due to MD entering room to assess patient, MD stated she wanted to go ahead and place central line in ED. Placement in process.

## 2012-06-14 NOTE — ED Notes (Signed)
Pt was brought in by ambulance with altered mental status onset yesterday per EMS according to the wife with fever and hyperglycemia with CBG=498. Pt is also noted to have swelling and redness to LLE onset April. Pt is noted to be confused, warm to the touch, respiration is even and unlabored.

## 2012-06-14 NOTE — Procedures (Signed)
Intubation Procedure Note Wayne [name] [id number] 02/10/1941  Procedure: Intubation Indications: Airway protection and maintenance  Procedure Details Consent: Risks of procedure as well as the alternatives and risks of each were explained to the (patient/caregiver).  Consent for procedure obtained. Time Out: Verified patient identification, verified procedure, site/side was marked, verified correct patient position, special equipment/implants available, medications/allergies/relevent history reviewed, required imaging and test results available.  Performed  Drugs: etomidate 20mg  IV, rocuronium 50mg  IV DL x1 with MAC and 3, grade 1 view, 8-0 ett passed through cords under direct visualization, placement confirmed with smoke in tube, end tidal CO2 change, bilateral breath sounds   Evaluation Hemodynamic Status: BP stable throughout; O2 sats: stable throughout Patient's Current Condition: stable Complications: No apparent complications Patient did tolerate procedure well. Chest X-ray ordered to verify placement.  CXR: pending.   Geralynn Capri 06/14/2012

## 2012-06-15 ENCOUNTER — Inpatient Hospital Stay (HOSPITAL_COMMUNITY): Payer: Medicare Other | Admitting: Anesthesiology

## 2012-06-15 ENCOUNTER — Inpatient Hospital Stay (HOSPITAL_COMMUNITY): Payer: Medicare Other

## 2012-06-15 ENCOUNTER — Encounter (HOSPITAL_COMMUNITY)
Admission: EM | Disposition: A | Discharge status: Nursing Home (Includes ICF) | Payer: Self-pay | Source: Ambulatory Visit | Attending: Internal Medicine

## 2012-06-15 ENCOUNTER — Encounter (HOSPITAL_COMMUNITY): Payer: Self-pay | Admitting: Anesthesiology

## 2012-06-15 DIAGNOSIS — L97909 Non-pressure chronic ulcer of unspecified part of unspecified lower leg with unspecified severity: Secondary | ICD-10-CM

## 2012-06-15 DIAGNOSIS — R109 Unspecified abdominal pain: Secondary | ICD-10-CM

## 2012-06-15 DIAGNOSIS — J209 Acute bronchitis, unspecified: Secondary | ICD-10-CM

## 2012-06-15 HISTORY — PX: I & D EXTREMITY: SHX5045

## 2012-06-15 LAB — GLUCOSE, CAPILLARY
Glucose-Capillary: 131 mg/dL — ABNORMAL HIGH (ref 70–99)
Glucose-Capillary: 146 mg/dL — ABNORMAL HIGH (ref 70–99)
Glucose-Capillary: 158 mg/dL — ABNORMAL HIGH (ref 70–99)
Glucose-Capillary: 164 mg/dL — ABNORMAL HIGH (ref 70–99)
Glucose-Capillary: 182 mg/dL — ABNORMAL HIGH (ref 70–99)
Glucose-Capillary: 209 mg/dL — ABNORMAL HIGH (ref 70–99)
Glucose-Capillary: 219 mg/dL — ABNORMAL HIGH (ref 70–99)
Glucose-Capillary: 221 mg/dL — ABNORMAL HIGH (ref 70–99)
Glucose-Capillary: 228 mg/dL — ABNORMAL HIGH (ref 70–99)
Glucose-Capillary: 236 mg/dL — ABNORMAL HIGH (ref 70–99)
Glucose-Capillary: 237 mg/dL — ABNORMAL HIGH (ref 70–99)
Glucose-Capillary: 252 mg/dL — ABNORMAL HIGH (ref 70–99)

## 2012-06-15 LAB — GRAM STAIN

## 2012-06-15 LAB — BLOOD GAS, ARTERIAL
Drawn by: 331001
MECHVT: 550 mL
O2 Saturation: 97.2 %
PEEP: 5 cmH2O
RATE: 20 resp/min
pO2, Arterial: 99.8 mmHg (ref 80.0–100.0)

## 2012-06-15 LAB — CBC
HCT: 33.6 % — ABNORMAL LOW (ref 39.0–52.0)
HCT: 23.5 % — ABNORMAL LOW (ref 39.0–52.0)
Hemoglobin: 10.7 g/dL — ABNORMAL LOW (ref 13.0–17.0)
Hemoglobin: 7.4 g/dL — ABNORMAL LOW (ref 13.0–17.0)
MCH: 25.1 pg — ABNORMAL LOW (ref 26.0–34.0)
MCH: 25.2 pg — ABNORMAL LOW (ref 26.0–34.0)
MCHC: 31.5 g/dL (ref 30.0–36.0)
MCV: 78.9 fL (ref 78.0–100.0)
MCV: 79.9 fL (ref 78.0–100.0)
RBC: 4.26 MIL/uL (ref 4.22–5.81)
RBC: 2.94 MIL/uL — ABNORMAL LOW (ref 4.22–5.81)
WBC: 24.4 10*3/uL — ABNORMAL HIGH (ref 4.0–10.5)

## 2012-06-15 LAB — BASIC METABOLIC PANEL
BUN: 21 mg/dL (ref 6–23)
BUN: 26 mg/dL — ABNORMAL HIGH (ref 6–23)
CO2: 16 mEq/L — ABNORMAL LOW (ref 19–32)
CO2: 14 mEq/L — ABNORMAL LOW (ref 19–32)
Calcium: 8.2 mg/dL — ABNORMAL LOW (ref 8.4–10.5)
Chloride: 108 mEq/L (ref 96–112)
Chloride: 105 mEq/L (ref 96–112)
Creatinine, Ser: 1.53 mg/dL — ABNORMAL HIGH (ref 0.50–1.35)
Creatinine, Ser: 2.35 mg/dL — ABNORMAL HIGH (ref 0.50–1.35)
GFR calc Af Amer: 31 mL/min — ABNORMAL LOW (ref >90)
Glucose, Bld: 133 mg/dL — ABNORMAL HIGH (ref 70–99)
Glucose, Bld: 269 mg/dL — ABNORMAL HIGH (ref 70–99)
Potassium: 4 mEq/L (ref 3.5–5.1)

## 2012-06-15 LAB — CARDIAC PANEL(CRET KIN+CKTOT+MB+TROPI)
CK, MB: 10.1 ng/mL (ref 0.3–4.0)
Relative Index: 3.3 — ABNORMAL HIGH (ref 0.0–2.5)
Total CK: 239 U/L — ABNORMAL HIGH (ref 7–232)
Troponin I: 5.17 ng/mL (ref <0.30)
Troponin I: 7.9 ng/mL (ref <0.30)

## 2012-06-15 LAB — POCT I-STAT 3, ART BLOOD GAS (G3+)
Bicarbonate: 12.9 mEq/L — ABNORMAL LOW (ref 20.0–24.0)
O2 Saturation: 98 %
O2 Saturation: 98 %
TCO2: 17 mmol/L (ref 0–100)
pCO2 arterial: 29.3 mmHg — ABNORMAL LOW (ref 35.0–45.0)
pCO2 arterial: 28.8 mmHg — ABNORMAL LOW (ref 35.0–45.0)
pH, Arterial: 7.359 (ref 7.350–7.450)
pO2, Arterial: 127 mmHg — ABNORMAL HIGH (ref 80.0–100.0)
pO2, Arterial: 125 mmHg — ABNORMAL HIGH (ref 80.0–100.0)

## 2012-06-15 LAB — CORTISOL: Cortisol, Plasma: 35.8 ug/dL

## 2012-06-15 LAB — CBC WITH DIFFERENTIAL/PLATELET
Basophils Relative: 0 % (ref 0–1)
HCT: 34.7 % — ABNORMAL LOW (ref 39.0–52.0)
Hemoglobin: 11 g/dL — ABNORMAL LOW (ref 13.0–17.0)
Lymphs Abs: 2.2 10*3/uL (ref 0.7–4.0)
MCHC: 31.7 g/dL (ref 30.0–36.0)
Monocytes Absolute: 1.1 10*3/uL — ABNORMAL HIGH (ref 0.1–1.0)
Monocytes Relative: 4 % (ref 3–12)
Neutro Abs: 21.6 10*3/uL — ABNORMAL HIGH (ref 1.7–7.7)
RBC: 4.39 MIL/uL (ref 4.22–5.81)

## 2012-06-15 LAB — HEMOGLOBIN AND HEMATOCRIT, BLOOD
HCT: 28.8 % — ABNORMAL LOW (ref 39.0–52.0)
Hemoglobin: 9.5 g/dL — ABNORMAL LOW (ref 13.0–17.0)

## 2012-06-15 LAB — CARBOXYHEMOGLOBIN: Methemoglobin: 0.9 % (ref 0.0–1.5)

## 2012-06-15 LAB — LEGIONELLA ANTIGEN, URINE

## 2012-06-15 LAB — MAGNESIUM: Magnesium: 1.4 mg/dL — ABNORMAL LOW (ref 1.5–2.5)

## 2012-06-15 SURGERY — IRRIGATION AND DEBRIDEMENT EXTREMITY
Anesthesia: General | Site: Knee | Laterality: Left | Wound class: Dirty or Infected

## 2012-06-15 MED ORDER — LACTATED RINGERS IV SOLN
INTRAVENOUS | Status: DC | PRN
  Administered 2012-06-15: 12:00:00 via INTRAVENOUS

## 2012-06-15 MED ORDER — 0.9 % SODIUM CHLORIDE (POUR BTL) OPTIME
TOPICAL | Status: DC | PRN
  Administered 2012-06-15: 1000 mL

## 2012-06-15 MED ORDER — CLINDAMYCIN PHOSPHATE 900 MG/50ML IV SOLN
900.0000 mg | INTRAVENOUS | Status: AC
  Administered 2012-06-15: 900 mg via INTRAVENOUS
  Filled 2012-06-15: qty 50

## 2012-06-15 MED ORDER — HYDROCORTISONE SOD SUCCINATE 100 MG IJ SOLR
50.0000 mg | INTRAMUSCULAR | Status: AC
  Administered 2012-06-15: 50 mg via INTRAVENOUS
  Filled 2012-06-15: qty 1

## 2012-06-15 MED ORDER — SODIUM BICARBONATE 8.4 % IV SOLN
INTRAVENOUS | Status: DC
  Filled 2012-06-15 (×4): qty 150

## 2012-06-15 MED ORDER — VANCOMYCIN HCL 1000 MG IV SOLR
1000.0000 mg | INTRAVENOUS | Status: DC
  Filled 2012-06-15: qty 1000

## 2012-06-15 MED ORDER — MAGNESIUM SULFATE 40 MG/ML IJ SOLN
2.0000 g | Freq: Once | INTRAMUSCULAR | Status: AC
  Administered 2012-06-15: 2 g via INTRAVENOUS
  Filled 2012-06-15: qty 50

## 2012-06-15 MED ORDER — CHLORHEXIDINE GLUCONATE 0.12 % MT SOLN
15.0000 mL | Freq: Two times a day (BID) | OROMUCOSAL | Status: DC
  Administered 2012-06-15 – 2012-06-18 (×8): 15 mL via OROMUCOSAL
  Filled 2012-06-15 (×8): qty 15

## 2012-06-15 MED ORDER — SODIUM CHLORIDE 0.9 % IR SOLN
Status: DC | PRN
  Administered 2012-06-15 (×2): 3000 mL

## 2012-06-15 MED ORDER — NOREPINEPHRINE BITARTRATE 1 MG/ML IJ SOLN
2.0000 ug/min | INTRAVENOUS | Status: DC
  Administered 2012-06-15: 20 ug/min via INTRAVENOUS
  Filled 2012-06-15: qty 8

## 2012-06-15 MED ORDER — HYDROCORTISONE SOD SUCCINATE 100 MG IJ SOLR
50.0000 mg | Freq: Four times a day (QID) | INTRAMUSCULAR | Status: DC
  Administered 2012-06-15 – 2012-06-19 (×15): 50 mg via INTRAVENOUS
  Filled 2012-06-15 (×19): qty 1

## 2012-06-15 MED ORDER — VASOPRESSIN 20 UNIT/ML IJ SOLN
100.0000 [IU] | INTRAVENOUS | Status: DC | PRN
  Administered 2012-06-15: .03 [IU]/min via INTRAVENOUS

## 2012-06-15 MED ORDER — VASOPRESSIN 20 UNIT/ML IJ SOLN
0.0300 [IU]/min | INTRAVENOUS | Status: DC
  Administered 2012-06-15 – 2012-06-16 (×2): 0.03 [IU]/min via INTRAVENOUS
  Filled 2012-06-15 (×2): qty 2.5

## 2012-06-15 MED ORDER — MIDAZOLAM HCL 5 MG/5ML IJ SOLN
INTRAMUSCULAR | Status: DC | PRN
  Administered 2012-06-15: 2 mg via INTRAVENOUS

## 2012-06-15 MED ORDER — ALBUMIN HUMAN 5 % IV SOLN
INTRAVENOUS | Status: DC | PRN
  Administered 2012-06-15 (×2): via INTRAVENOUS

## 2012-06-15 MED ORDER — PHENYLEPHRINE HCL 10 MG/ML IJ SOLN
10.0000 mg | INTRAVENOUS | Status: DC | PRN
  Administered 2012-06-15: 200 ug/min via INTRAVENOUS

## 2012-06-15 MED ORDER — GENTAMICIN SULFATE 40 MG/ML IJ SOLN
INTRAMUSCULAR | Status: DC | PRN
  Administered 2012-06-15: 240 mg

## 2012-06-15 MED ORDER — HEPARIN BOLUS VIA INFUSION
3000.0000 [IU] | Freq: Once | INTRAVENOUS | Status: DC
  Filled 2012-06-15: qty 3000

## 2012-06-15 MED ORDER — SODIUM CHLORIDE 0.9 % IV SOLN
100.0000 [IU] | INTRAVENOUS | Status: DC | PRN
  Administered 2012-06-15: 4.2 [IU] via INTRAVENOUS

## 2012-06-15 MED ORDER — IMIPENEM-CILASTATIN 250 MG IV SOLR
250.0000 mg | Freq: Four times a day (QID) | INTRAVENOUS | Status: DC
  Administered 2012-06-16 – 2012-06-17 (×7): 250 mg via INTRAVENOUS
  Filled 2012-06-15 (×8): qty 250

## 2012-06-15 MED ORDER — HEPARIN BOLUS VIA INFUSION
4000.0000 [IU] | Freq: Once | INTRAVENOUS | Status: AC
  Administered 2012-06-15: 4000 [IU] via INTRAVENOUS
  Filled 2012-06-15: qty 4000

## 2012-06-15 MED ORDER — SODIUM CHLORIDE 0.9 % IV BOLUS (SEPSIS)
750.0000 mL | Freq: Once | INTRAVENOUS | Status: AC
  Administered 2012-06-15: 750 mL via INTRAVENOUS

## 2012-06-15 MED ORDER — SODIUM CHLORIDE 0.9 % IV SOLN
500.0000 mg | Freq: Three times a day (TID) | INTRAVENOUS | Status: DC
  Administered 2012-06-15: 500 mg via INTRAVENOUS
  Filled 2012-06-15 (×2): qty 500

## 2012-06-15 MED ORDER — NOREPINEPHRINE BITARTRATE 1 MG/ML IJ SOLN
2.0000 ug/min | INTRAVENOUS | Status: DC
  Administered 2012-06-15: 60 ug/min via INTRAVENOUS
  Administered 2012-06-15: 75 ug/min via INTRAVENOUS
  Administered 2012-06-17: 20 ug/min via INTRAVENOUS
  Filled 2012-06-15 (×7): qty 16

## 2012-06-15 MED ORDER — HEPARIN (PORCINE) IN NACL 100-0.45 UNIT/ML-% IJ SOLN
1500.0000 [IU]/h | INTRAMUSCULAR | Status: DC
  Administered 2012-06-15: 1300 [IU]/h via INTRAVENOUS
  Filled 2012-06-15 (×3): qty 250

## 2012-06-15 MED ORDER — SODIUM CHLORIDE 0.9 % IV BOLUS (SEPSIS)
1000.0000 mL | Freq: Once | INTRAVENOUS | Status: AC
  Administered 2012-06-15: 1000 mL via INTRAVENOUS

## 2012-06-15 MED ORDER — SODIUM CHLORIDE 0.9 % IV BOLUS (SEPSIS): 500.0000 mL | INTRAVENOUS | Status: DC | PRN

## 2012-06-15 MED ORDER — VECURONIUM BROMIDE 10 MG IV SOLR
INTRAVENOUS | Status: DC | PRN
  Administered 2012-06-15 (×2): 5 mg via INTRAVENOUS

## 2012-06-15 MED ORDER — HYDROCORTISONE SOD SUCCINATE 100 MG IJ SOLR: 50.0000 mg | Freq: Four times a day (QID) | INTRAMUSCULAR | Status: DC

## 2012-06-15 MED ORDER — METOPROLOL TARTRATE 1 MG/ML IV SOLN
2.5000 mg | INTRAVENOUS | Status: DC | PRN
  Administered 2012-06-15: 2.5 mg via INTRAVENOUS
  Administered 2012-06-15: 5 mg via INTRAVENOUS
  Administered 2012-06-15: 2.5 mg via INTRAVENOUS
  Filled 2012-06-15 (×2): qty 5

## 2012-06-15 MED ORDER — HEPARIN (PORCINE) IN NACL 100-0.45 UNIT/ML-% IJ SOLN
1500.0000 [IU]/h | INTRAMUSCULAR | Status: DC
  Administered 2012-06-15: 1500 [IU]/h via INTRAVENOUS
  Filled 2012-06-15 (×2): qty 250

## 2012-06-15 MED ORDER — FENTANYL CITRATE 0.05 MG/ML IJ SOLN
INTRAMUSCULAR | Status: DC | PRN
  Administered 2012-06-15 (×3): 50 ug via INTRAVENOUS
  Administered 2012-06-15: 100 ug via INTRAVENOUS

## 2012-06-15 MED ORDER — VANCOMYCIN HCL 500 MG IV SOLR
INTRAVENOUS | Status: DC | PRN
  Administered 2012-06-15: 1000 mg

## 2012-06-15 MED ORDER — CLINDAMYCIN PHOSPHATE 600 MG/50ML IV SOLN
600.0000 mg | Freq: Four times a day (QID) | INTRAVENOUS | Status: DC
  Filled 2012-06-15: qty 50

## 2012-06-15 MED ORDER — ASPIRIN 325 MG PO TABS
325.0000 mg | ORAL_TABLET | Freq: Every day | ORAL | Status: DC
  Administered 2012-06-15 – 2012-06-21 (×7): 325 mg
  Filled 2012-06-15 (×7): qty 1

## 2012-06-15 MED ORDER — VASOPRESSIN 20 UNIT/ML IJ SOLN
0.0300 [IU]/min | INTRAVENOUS | Status: DC
  Filled 2012-06-15: qty 2.5

## 2012-06-15 MED ORDER — SODIUM CHLORIDE 0.9 % IV BOLUS (SEPSIS)
1000.0000 mL | Freq: Once | INTRAVENOUS | Status: AC
  Administered 2012-06-14: 1000 mL via INTRAVENOUS

## 2012-06-15 MED ORDER — CLINDAMYCIN PHOSPHATE 600 MG/50ML IV SOLN
600.0000 mg | Freq: Three times a day (TID) | INTRAVENOUS | Status: DC
  Administered 2012-06-15 – 2012-06-16 (×2): 600 mg via INTRAVENOUS
  Filled 2012-06-15 (×4): qty 50

## 2012-06-15 MED ORDER — EPINEPHRINE HCL 1 MG/ML IJ SOLN
0.5000 ug/min | INTRAMUSCULAR | Status: DC
  Administered 2012-06-15: 5 ug/min via INTRAVENOUS
  Administered 2012-06-15: 2 ug/min via INTRAVENOUS
  Filled 2012-06-15 (×2): qty 1

## 2012-06-15 MED ORDER — MAGNESIUM SULFATE 50 % IJ SOLN
2.0000 g | Freq: Once | INTRAVENOUS | Status: DC
  Filled 2012-06-15: qty 4

## 2012-06-15 MED ORDER — DEXTROSE 5 % IV SOLN
30.0000 ug/min | INTRAVENOUS | Status: DC
  Administered 2012-06-15: 50 ug/min via INTRAVENOUS
  Administered 2012-06-15: 100 ug/min via INTRAVENOUS
  Filled 2012-06-15 (×4): qty 4

## 2012-06-15 MED ORDER — LINEZOLID 2 MG/ML IV SOLN
600.0000 mg | Freq: Two times a day (BID) | INTRAVENOUS | Status: DC
  Administered 2012-06-15 – 2012-06-17 (×5): 600 mg via INTRAVENOUS
  Filled 2012-06-15 (×7): qty 300

## 2012-06-15 MED ORDER — PHENYLEPHRINE HCL 10 MG/ML IJ SOLN
INTRAMUSCULAR | Status: DC | PRN
  Administered 2012-06-15 (×5): 80 ug via INTRAVENOUS

## 2012-06-15 MED ORDER — BIOTENE DRY MOUTH MT LIQD
15.0000 mL | Freq: Four times a day (QID) | OROMUCOSAL | Status: DC
  Administered 2012-06-15 – 2012-06-18 (×15): 15 mL via OROMUCOSAL

## 2012-06-15 MED ORDER — HEPARIN SODIUM (PORCINE) 5000 UNIT/ML IJ SOLN
5000.0000 [IU] | Freq: Three times a day (TID) | INTRAMUSCULAR | Status: DC
  Administered 2012-06-15 – 2012-06-18 (×10): 5000 [IU] via SUBCUTANEOUS
  Filled 2012-06-15 (×11): qty 1

## 2012-06-15 SURGICAL SUPPLY — 54 items
BANDAGE GAUZE ELAST BULKY 4 IN (GAUZE/BANDAGES/DRESSINGS) ×2 IMPLANT
BLADE SURG 10 STRL SS (BLADE) ×2 IMPLANT
BNDG COHESIVE 4X5 TAN STRL (GAUZE/BANDAGES/DRESSINGS) ×1 IMPLANT
BNDG COHESIVE 6X5 TAN STRL LF (GAUZE/BANDAGES/DRESSINGS) ×3 IMPLANT
BNDG GAUZE STRTCH 6 (GAUZE/BANDAGES/DRESSINGS) ×3 IMPLANT
CLOTH BEACON ORANGE TIMEOUT ST (SAFETY) ×2 IMPLANT
COTTON STERILE ROLL (GAUZE/BANDAGES/DRESSINGS) ×2 IMPLANT
COVER SURGICAL LIGHT HANDLE (MISCELLANEOUS) ×2 IMPLANT
CUFF TOURNIQUET SINGLE 18IN (TOURNIQUET CUFF) ×1 IMPLANT
CUFF TOURNIQUET SINGLE 24IN (TOURNIQUET CUFF) IMPLANT
CUFF TOURNIQUET SINGLE 34IN LL (TOURNIQUET CUFF) IMPLANT
CUFF TOURNIQUET SINGLE 44IN (TOURNIQUET CUFF) IMPLANT
DRAIN PENROSE 3/4X12 (DRAIN) ×3 IMPLANT
DRAPE U-SHAPE 47X51 STRL (DRAPES) ×2 IMPLANT
DRSG ADAPTIC 3X8 NADH LF (GAUZE/BANDAGES/DRESSINGS) ×3 IMPLANT
DRSG PAD ABDOMINAL 8X10 ST (GAUZE/BANDAGES/DRESSINGS) ×2 IMPLANT
DURAPREP 26ML APPLICATOR (WOUND CARE) ×1 IMPLANT
ELECT CAUTERY BLADE 6.4 (BLADE) ×1 IMPLANT
ELECT REM PT RETURN 9FT ADLT (ELECTROSURGICAL) ×2
ELECTRODE REM PT RTRN 9FT ADLT (ELECTROSURGICAL) IMPLANT
GLOVE BIOGEL PI IND STRL 9 (GLOVE) ×1 IMPLANT
GLOVE BIOGEL PI INDICATOR 9 (GLOVE) ×1
GLOVE SURG ORTHO 9.0 STRL STRW (GLOVE) ×2 IMPLANT
GLOVE SURG SS PI 7.0 STRL IVOR (GLOVE) ×2 IMPLANT
GOWN PREVENTION PLUS XLARGE (GOWN DISPOSABLE) ×1 IMPLANT
GOWN SRG XL XLNG 56XLVL 4 (GOWN DISPOSABLE) ×1 IMPLANT
GOWN STRL NON-REIN XL XLG LVL4 (GOWN DISPOSABLE)
GOWN STRL REIN XL XLG (GOWN DISPOSABLE) ×3 IMPLANT
HANDPIECE INTERPULSE COAX TIP (DISPOSABLE) ×2
KIT BASIN OR (CUSTOM PROCEDURE TRAY) ×2 IMPLANT
KIT ROOM TURNOVER OR (KITS) ×2 IMPLANT
KIT STIMULAN RAPID CURE  10CC (Orthopedic Implant) ×1 IMPLANT
KIT STIMULAN RAPID CURE 10CC (Orthopedic Implant) IMPLANT
MANIFOLD NEPTUNE II (INSTRUMENTS) ×2 IMPLANT
NDL SAFETY ECLIPSE 18X1.5 (NEEDLE) IMPLANT
NEEDLE HYPO 18GX1.5 SHARP (NEEDLE) ×2
NS IRRIG 1000ML POUR BTL (IV SOLUTION) ×2 IMPLANT
PACK ORTHO EXTREMITY (CUSTOM PROCEDURE TRAY) ×2 IMPLANT
PAD ARMBOARD 7.5X6 YLW CONV (MISCELLANEOUS) ×4 IMPLANT
PADDING CAST COTTON 6X4 STRL (CAST SUPPLIES) ×1 IMPLANT
PENCIL BUTTON HOLSTER BLD 10FT (ELECTRODE) ×1 IMPLANT
SET HNDPC FAN SPRY TIP SCT (DISPOSABLE) IMPLANT
SPONGE GAUZE 4X4 12PLY (GAUZE/BANDAGES/DRESSINGS) ×3 IMPLANT
SPONGE LAP 18X18 X RAY DECT (DISPOSABLE) ×2 IMPLANT
STOCKINETTE IMPERVIOUS 9X36 MD (GAUZE/BANDAGES/DRESSINGS) ×2 IMPLANT
SUT ETHILON 2 0 PSLX (SUTURE) ×3 IMPLANT
SYRINGE 10CC LL (SYRINGE) ×1 IMPLANT
TOWEL OR 17X24 6PK STRL BLUE (TOWEL DISPOSABLE) ×2 IMPLANT
TOWEL OR 17X26 10 PK STRL BLUE (TOWEL DISPOSABLE) ×2 IMPLANT
TUBE ANAEROBIC SPECIMEN COL (MISCELLANEOUS) ×1 IMPLANT
TUBE CONNECTING 12X1/4 (SUCTIONS) ×3 IMPLANT
UNDERPAD 30X30 INCONTINENT (UNDERPADS AND DIAPERS) ×2 IMPLANT
WATER STERILE IRR 1000ML POUR (IV SOLUTION) ×1 IMPLANT
YANKAUER SUCT BULB TIP NO VENT (SUCTIONS) ×3 IMPLANT
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
## 2012-06-15 NOTE — Progress Notes (Signed)
eLink Physician-Brief Progress Note Patient Name: Wayne [name] DOB: [birth year] MRN: [id number]  Date of Service  06/15/2012   HPI/Events of Note  High fever, persistent hypotension, tachycardia, positive cardiac markers. H/O CAD with stents   eICU Interventions  EKG now and in AM, metoprolol to keep HR < 115/min, ASA, heparin, cooling blanket to maintain T< 100.5, phenylephrine to maintain MAP > 65 mmHg      Billy Fischer 06/15/2012, 1:05 AM

## 2012-06-15 NOTE — Progress Notes (Signed)
VASCULAR LAB PRELIMINARY  PRELIMINARY  PRELIMINARY  PRELIMINARY  Bilateral lower extremity venous duplex completed completed.    Preliminary report: Right lower extremity venous duplex is negative for deep and superficial vein thrombosis.  Left leg is negative for deep and superficial vein thrombosis from the common femoral to the mid thigh region.  Due to surgical dressing on left leg unable to evaluate from mid thigh distally.  Eshan Trupiano,   RVT 06/15/2012, 2:12 PM

## 2012-06-15 NOTE — Anesthesia Preprocedure Evaluation (Signed)
Anesthesia Evaluation  Patient identified by MRN, date of birth, ID band Patient awake    Reviewed: Allergy & Precautions, H&P , NPO status , Patient's Chart, lab work & pertinent test results  Airway      Comment: Intubated from ICU Dental   Pulmonary shortness of breath and with exertion, sleep apnea , pneumonia ,   Course BS        Cardiovascular hypertension, Pt. on medications + CAD and +CHF     Neuro/Psych    GI/Hepatic Neg liver ROS, GERD-  Controlled,  Endo/Other  Diabetes mellitus-, Type 2  Renal/GU negative Renal ROS     Musculoskeletal   Abdominal   Peds  Hematology   Anesthesia Other Findings   Reproductive/Obstetrics                           Anesthesia Physical Anesthesia Plan  ASA: IV  Anesthesia Plan: General   Post-op Pain Management:    Induction: Intravenous  Airway Management Planned: Oral ETT  Additional Equipment:   Intra-op Plan:   Post-operative Plan: Post-operative intubation/ventilation  Informed Consent: I have reviewed the patients History and Physical, chart, labs and discussed the procedure including the risks, benefits and alternatives for the proposed anesthesia with the patient or authorized representative who has indicated his/her understanding and acceptance.     Plan Discussed with: CRNA and Surgeon  Anesthesia Plan Comments:         Anesthesia Quick Evaluation

## 2012-06-15 NOTE — Transfer of Care (Signed)
Immediate Anesthesia Transfer of Care Note  Patient: Wayne [name]  Procedure(s) Performed: Procedure(s) (LRB): IRRIGATION AND DEBRIDEMENT EXTREMITY (Left)  Patient Location: PACU  Anesthesia Type: General  Level of Consciousness: sedated and Patient remains intubated per anesthesia plan  Airway & Oxygen Therapy: Patient remains intubated per anesthesia plan and Patient placed on Ventilator (see vital sign flow sheet for setting)  Post-op Assessment: Report given to PACU RN and Post -op Vital signs reviewed and stable  Post vital signs: Reviewed and stable  Complications: No apparent anesthesia complications

## 2012-06-15 NOTE — Progress Notes (Signed)
ANTICOAGULATION CONSULT NOTE - Follow Up Consult  Pharmacy Consult for Heparin Indication: chest pain/ACS  Allergies  Allergen Reactions  . Atorvastatin     REACTION: sore legs    Patient Measurements: Height: 5\' 10"  (177.8 cm) Weight: 235 lb 10.8 oz (106.9 kg) IBW/kg (Calculated) : 73  Heparin Dosing Weight: 95Kg  Vital Signs: Temp: 98.1 F (36.7 C) (07/02 1500) BP: 89/57 mmHg (07/02 1500) Pulse Rate: 122  (07/02 1500)  Labs:  Basename 06/15/12 1207 06/15/12 0950 06/15/12 0800 06/15/12 0400 06/14/12 2350 06/14/12 1817 06/14/12 1634 06/14/12 1307  HGB 11.0* -- -- 10.7* -- -- -- --  HCT 34.7* -- -- 33.6* -- 28.3* -- --  PLT 374 -- -- 383 -- 317 -- --  APTT -- -- -- -- -- 38* -- --  LABPROT -- -- -- -- -- 18.9* -- --  INR -- -- -- -- -- 1.55* -- --  HEPARINUNFRC -- -- <0.10* -- -- -- -- --  CREATININE -- -- -- 1.53* -- 1.01 -- 1.04  CKTOTAL -- 242* -- -- 239* -- 255* --  CKMB -- 8.1* -- -- 10.1* -- 11.5* --  TROPONINI -- 7.90* -- -- 5.17* -- 3.49* --    Estimated Creatinine Clearance: 56.6 ml/min (by C-G formula based on Cr of 1.53).   Medications:  Prescriptions prior to admission  Medication Sig Dispense Refill  . aspirin EC 81 MG tablet Take 81 mg by mouth daily.      Marland Kitchen glimepiride (AMARYL) 4 MG tablet Take 4 mg by mouth daily before breakfast.      . HYDROcodone-acetaminophen (VICODIN) 5-500 MG per tablet Take 1 tablet by mouth every 6 (six) hours as needed. For pain.      . metFORMIN (GLUCOPHAGE) 500 MG tablet Take 500 mg by mouth 2 (two) times daily with a meal.      . nitroGLYCERIN (NITROSTAT) 0.4 MG SL tablet Place 0.4 mg under the tongue every 5 (five) minutes as needed. For chest pain.      . prasugrel (EFFIENT) 10 MG TABS Take 10 mg by mouth daily.        Assessment: Mr. Landin developed a hematoma from anticoagulation in the popliteal fossa. This was resolving slowly and apparently developed an acute infection in the hematoma. He was admitted and started  on broad spectrum abx. He is now s/p I & D with 900cc of blood and pus drained in the OR.  He was started on IV heparin post admit for elevated troponins- his heparin level this AM was low- a heparin bolus was planned but then cancelled as patient was to go to OR shortly after. His rate was briefly increased, then drip turned off en route to OR.  His H/H and plts are stable, and he had no bleeding problems pre-op. Currently his surgical site is   . Received orders to resume heparin post-op.  Goal of Therapy:  Heparin level 0.3-0.7 units/ml Monitor platelets by anticoagulation protocol: Yes   Plan:  - Heparin drip was restarted at 1500 units/hr, no bolus d/t recent OR with some blood loss - Will plan to check heparin level at 2100 today - Will f/up daily heparin level and CBC  Mirna Mires K 06/15/2012,3:26 PM

## 2012-06-15 NOTE — Progress Notes (Signed)
ANTICOAGULATION CONSULT NOTE - Initial Consult  Pharmacy Consult for Heparin Indication: chest pain/ACS  Allergies  Allergen Reactions  . Atorvastatin     REACTION: sore legs    Patient Measurements: Height: 5\' 10"  (177.8 cm) Weight: 226 lb 6.6 oz (102.7 kg) IBW/kg (Calculated) : 73  Heparin Dosing Weight: 95 kg  Vital Signs: Temp: 104 F (40 C) (07/02 0100) Temp src: Other (Comment) (07/02 0000) BP: 93/76 mmHg (07/02 0100) Pulse Rate: 132  (07/02 0100)  Labs:  Basename 06/14/12 2350 06/14/12 1817 06/14/12 1634 06/14/12 1307  HGB -- 9.0* -- 10.9*  HCT -- 28.3* -- 33.9*  PLT -- 317 -- 426*  APTT -- 38* -- --  LABPROT -- 18.9* -- --  INR -- 1.55* -- --  HEPARINUNFRC -- -- -- --  CREATININE -- 1.01 -- 1.04  CKTOTAL 239* -- 255* --  CKMB 10.1* -- 11.5* --  TROPONINI 5.17* -- 3.49* --    Estimated Creatinine Clearance: 84.1 ml/min (by C-G formula based on Cr of 1.01).   Medical History: Past Medical History  Diagnosis Date  . DM 06/20/2009  . HYPERLIPIDEMIA 06/20/2009  . HYPERTENSION 06/20/2009  . CORONARY ATHEROSCLEROSIS NATIVE CORONARY ARTERY 01/27/2011    s/p PCI 2/12  . Herpes zoster ophthalmicus   . Arthritis   . Obesity   . GERD (gastroesophageal reflux disease)   . Ischemic cardiomyopathy 2/12    EF 31%  . Adenocarcinoma of prostate     s/p seed implants  . Polymyalgia rheumatica     Medications:  Prescriptions prior to admission  Medication Sig Dispense Refill  . aspirin EC 81 MG tablet Take 81 mg by mouth daily.      Marland Kitchen glimepiride (AMARYL) 4 MG tablet Take 4 mg by mouth daily before breakfast.      . HYDROcodone-acetaminophen (VICODIN) 5-500 MG per tablet Take 1 tablet by mouth every 6 (six) hours as needed. For pain.      . metFORMIN (GLUCOPHAGE) 500 MG tablet Take 500 mg by mouth 2 (two) times daily with a meal.      . nitroGLYCERIN (NITROSTAT) 0.4 MG SL tablet Place 0.4 mg under the tongue every 5 (five) minutes as needed. For chest pain.      .  prasugrel (EFFIENT) 10 MG TABS Take 10 mg by mouth daily.        Assessment: 71 y.o. Male known to pharmacy from antibiotic dosing. Pt with h/o CAD and stents. Noted now to have positive troponin. To begin heparin for ACS. INR 1.55 at baseline - no coumadin on board - ?due to liver dysfunction.  Goal of Therapy:  Heparin level 0.3-0.7 units/ml Monitor platelets by anticoagulation protocol: Yes   Plan:  1. Heparin 4000 units IV bolus 2. Heparin gtt at 1300 units/hr 3. F/u 6 hr heparin level and CBC 4. Daily heparin level and CBC 5. D/c SQ heparin  Christoper Fabian, PharmD, BCPS Clinical pharmacist, pager [phone] 06/15/2012,1:11 AM

## 2012-06-15 NOTE — Procedures (Signed)
Arterial Catheter Insertion Procedure Note Wayne [name] [id number] 08-Feb-1941  Procedure: Insertion of Arterial Catheter  Indications: Blood pressure monitoring and Frequent blood sampling  Procedure Details Consent: Unable to obtain consent because of altered level of consciousness. Time Out: Verified patient identification, verified procedure, site/side was marked, verified correct patient position, special equipment/implants available, medications/allergies/relevent history reviewed, required imaging and test results available.  Performed  Maximum sterile technique was used including antiseptics, cap, gloves, gown, hand hygiene, mask and sheet. Skin prep: Chlorhexidine; local anesthetic administered 20 gauge catheter was inserted into right radial artery using the Seldinger technique.  Evaluation Blood flow good; BP tracing good. Complications: No apparent complications.   [name] [facility] 06/15/2012

## 2012-06-15 NOTE — Progress Notes (Signed)
CRITICAL VALUE ALERT  Critical value received:  Critical EKG(long qtc plus anterior infarct)  Date of notification:  06/15/12  Time of notification:  0642  Critical value read back:yes  Nurse who received alert:  Tenise Stetler RN  MD notified (1st page):  Dr. Billy Fischer  Time of first page:  0645  MD notified (2nd page):Dr. Sung Amabile  Time of second page:NA  Responding MD:  Sung Amabile  Time MD responded:  [phone]

## 2012-06-15 NOTE — Anesthesia Procedure Notes (Signed)
Date/Time: 06/15/2012 12:24 PM Performed by: Luster Landsberg Pre-anesthesia Checklist: Patient identified, Emergency Drugs available, Suction available and Patient being monitored Patient Re-evaluated:Patient Re-evaluated prior to inductionOxygen Delivery Method: Circle system utilized Intubation Type: Inhalational induction and Inhalational induction with existing ETT Tube size: 7.5 mm

## 2012-06-15 NOTE — Progress Notes (Signed)
Name: Wayne [name] MRN: [id number] DOB: [birth year]    LOS: 1 Date of admit 06/14/2012 12:48 PM PCP is Kristian Covey, MD Ortho is Dr Jonathon Bellows  Referring Provider:  Dr Preston Fleeting of ER Reason for Referral:  Sepsis, hypotension, acidsis , hyperglycemia  PULMONARY / CRITICAL CARE MEDICINE   71 yr old septic shock, resp failure, leg source  Events Since Admission: BP dropped and respiratory status deteriorated so pt was intubated  Current Status: Intubated and sedated.   Lines/Tubes: ETT 7/1>> NG Tube 7/1 >> Foley 7/1>>> RIJ 7/1>>> R Radial 7/1>>> RPIV 7/1>>> LPIV 7/1>>>  ABX: Vanc 7/1 >>> Zosyn 7/1 >>> 7/2 clinda>>>  Vital Signs: Temp:  [98 F (36.7 C)-104.3 F (40.2 C)] 101.8 F (38.8 C) (07/02 1015) Pulse Rate:  [67-154] 104  (07/02 1015) Resp:  [16-42] 20  (07/02 1015) BP: (60-123)/(33-78) 94/60 mmHg (07/02 1000) SpO2:  [75 %-100 %] 100 % (07/02 1015) FiO2 (%):  [40 %-50 %] 40 % (07/02 0800) Weight:  [226 lb 6.6 oz (102.7 kg)-235 lb 10.8 oz (106.9 kg)] 235 lb 10.8 oz (106.9 kg) (07/02 0433)  Physical Examination: General:  Obese male. InER bed. Looks critically ill Neuro:  Curerntly RASS -2 HEENT:  PEERL + Neck:  No elevated jvp Cardiovascular:  Tachycardic, hypotensive, Normal heart sounds Lungs:  coarse Abdomen:  Obese, soft, normal bowel sounds Musculoskeletal:  LLE - chronic edema, red, brawn, redness knee to ankgle, tender Skin:  Otherwise itnact  Principal Problem:  *Severe sepsis Active Problems:  HYPERTROPHY PROSTATE W/UR OBST & OTH LUTS  CORONARY ATHEROSCLEROSIS NATIVE CORONARY ARTERY  Polymyalgia rheumatica  Systolic CHF, chronic  OSA (obstructive sleep apnea)  Obesity  Metabolic acidosis  Cellulitis and abscess of lower leg  Acute respiratory failure  Healthcare-associated pneumonia  Hyperglycemia  Acute encephalopathy   ASSESSMENT AND PLAN  PULMONARY  Lab 06/15/12 0417 06/14/12 2301 06/14/12 2111 06/14/12 1555  PHART 7.359 7.299*  7.448 7.323*  PCO2ART 29.3* 36.2 20.9* 21.8*  PO2ART 127.0* 93.0 70.0* 111.0*  HCO3 16.2* 17.6* 14.5* 11.3*  O2SAT 98.0 96.0 95.0 98.0   Ventilator Settings: Vent Mode:  [-] PRVC FiO2 (%):  [40 %-50 %] 40 % Set Rate:  [16 bmp-20 bmp] 20 bmp Vt Set:  [550 mL] 550 mL PEEP:  [5 cmH20] 5 cmH20 Plateau Pressure:  [8 cmH20-23 cmH20] 17 cmH20 CXR:  06/14/2012 12:48 PM  - LL consolidation  ETT: 7/1>>>  A:  Acute Resp Distress due to metabolic acidosis +/- LLL PNA,  r/o ards, not impressed overload P:   abg reviewed, maintain current MV pcxr in am , ARDS? Not impressed edema NO weaning On min O2 needs Bolus ok   CARDIOVASCULAR  Lab 06/15/12 0950 06/14/12 2350 06/14/12 1818 06/14/12 1635 06/14/12 1634  TROPONINI 7.90* 5.17* -- -- 3.49*  LATICACIDVEN -- -- -- 3.9* --  PROBNP -- -- 24481.0* -- --   ECG:  Lines:  RIJ 7/1>>> R Radial 7/1>>> RPIV 7/1>>> LPIV 7/1>>>  A: SEvere sepsis and hypotension - bp improved after 3L fluid in ER P:  Continue fluid resus- 2 more liters (wary ef 45%) Bolus further Add vaso to neo, likely will need to transition off and back attempt to levo edgt Echo assess reg wall likley stress ischemia Asa Hep on hold to or  RENAL  Lab 06/15/12 0400 06/14/12 1817 06/14/12 1307  NA 138 136 132*  K 3.6 3.6 --  CL 108 104 93*  CO2 16* 16* 12*  BUN 21 17  16  CREATININE 1.53* 1.01 1.04  CALCIUM 8.2* 7.7* 9.3  MG 1.4* 1.4* --  PHOS 2.8 2.2* --   Intake/Output      07/01 0701 - 07/02 0700 07/02 0701 - 07/03 0700   I.V. (mL/kg) 763.9 (7.1) 271.8 (2.5)   NG/GT 30 30   IV Piggyback 250    Total Intake(mL/kg) 1043.9 (9.8) 301.8 (2.8)   Urine (mL/kg/hr) 350 (0.1) 15 (0)   Total Output 350 15   Net +693.9 +286.8        Urine Occurrence 1 x     Foley: not placed at admit 06/14/2012 12:48 PM   A:  Severe metabolic acidosis, suspect due to sepsis lactic acid P:   No role bicarb Pre renal / atn, bnolus bmet post op Low threshold renal US cvp noted,  re bolus High insenible losses  GASTROINTESTINAL  Lab 06/14/12 1817 06/14/12 1307  AST 43* 36  ALT 23 27  ALKPHOS 97 83  BILITOT 0.3 0.5  PROT 5.5* 6.9  ALBUMIN 1.6* 2.1*    A:  None P:   Keep npo except meds ppi  HEMATOLOGIC  Lab 06/15/12 0400 06/14/12 1817 06/14/12 1307  HGB 10.7* 9.0* 10.9*  HCT 33.6* 28.3* 33.9*  PLT 383 317 426*  INR -- 1.55* --  APTT -- 38* --   A:  Anemia of chrnic and critical illness P:  - cbc in am  Hep hold to or  INFECTIOUS  Lab 06/15/12 0400 06/14/12 1817 06/14/12 1634 06/14/12 1307  WBC 24.4* 16.5* -- 20.7*  PROCALCITON -- -- 7.60 --   Cultures: Results for orders placed during the hospital encounter of 06/14/12  MRSA PCR SCREENING     Status: Normal   Collection Time   06/14/12  7:17 PM      Component Value Range Status Comment   MRSA by PCR NEGATIVE  NEGATIVE Final     Antibiotics: Anti-infectives     Start     Dose/Rate Route Frequency Ordered Stop   06/15/12 0600   vancomycin (VANCOCIN) IVPB 1000 mg/200 mL premix        1,000 mg 200 mL/hr over 60 Minutes Intravenous Every 12 hours 06/14/12 1745     06/15/12 0200  piperacillin-tazobactam (ZOSYN) IVPB 3.375 g       3.375 g 12.5 mL/hr over 240 Minutes Intravenous Every 8 hours 06/14/12 1745     06/14/12 1615   vancomycin (VANCOCIN) IVPB 1000 mg/200 mL premix        1,000 mg 200 mL/hr over 60 Minutes Intravenous  Once 06/14/12 1608 06/14/12 1924   06/14/12 1615  piperacillin-tazobactam (ZOSYN) IVPB 3.375 g       3.375 g 100 mL/hr over 30 Minutes Intravenous  Once 06/14/12 1608 06/14/12 1854   06/14/12 1600   vancomycin (VANCOCIN) IVPB 1000 mg/200 mL premix        1,000 mg 200 mL/hr over 60 Minutes Intravenous  Once 06/14/12 1547 06/14/12 1721   06/14/12 1545   vancomycin (VANCOCIN) injection 1,000 mg  Status:  Discontinued        1,000 mg Intravenous  Once 06/14/12 1532 06/14/12 1547           A:  Cellulitis LLE andLL r/o nec fasc, soft tiisue necrosis P:     STAT clinda toxin inhibition Vanc Zosyn MRI LLE (d/w Dr Lajoyce Corners) too unstable To OR STAT  ENDOCRINE  Lab 06/14/12 2344 06/14/12 2240 06/14/12 2130 06/14/12 2026 06/14/12 1925  GLUCAP 228* 258*  259* 337* 326*   A:  Hyperglycemia likely reaction to sepsis and not DKA . Known diabetic on oral agents P:   No oral agents in hospital ICU hyperglycemia protocol Cortisol then stress steroids, h/o PMR  NEUROLOGIC  A:  Confused at admit. Normalized with fluids and bp increase P:   WUA fent  GLOBAL  - he said no cpr but wife over-ruled him. Both want short term ventilaton. He said he would rather die rather than amputation; wife seems to concur but both do not want to rush to any decision. To or  BEST PRACTICE / DISPOSITION Level of Care:  icu Primary Service:  pccm Consultants:  Ortho Dr Lajoyce Corners  Code Status:  Full  Diet:  npo DVT Px:  Sq heparin GI Px:  ppi Skin Integrity:  Intact other than LLE Social / Family:  Wife dpoa updated  Shelly Flatten, MD Family Medicine PGY-2 06/15/2012, 11:01 AM  I have fully examined this patient and agree with above findings.    And edited in full  Ccm time 35 min   Mcarthur Rossetti. Tyson Alias, MD, FACP Pgr: [phone] [facility]

## 2012-06-15 NOTE — Consult Note (Signed)
  Plan for irrigation and debridement abscess popliteal fossa today approximately 5 PM.

## 2012-06-15 NOTE — Progress Notes (Signed)
UR Completed.  Jenel Gierke Jane [phone] 06/15/2012  

## 2012-06-15 NOTE — Progress Notes (Signed)
RT pulled ETT back 4cm  Per MD order. Pt tolerated well. RT to continue to monitor.

## 2012-06-15 NOTE — Progress Notes (Signed)
ANTIBIOTIC CONSULT NOTE - INITIAL  Pharmacy Consult for Primaxin Indication: Cover for Respiriatory Gram Neg  Allergies  Allergen Reactions  . Atorvastatin     REACTION: sore legs    Patient Measurements: Height: 5\' 10"  (177.8 cm) Weight: 235 lb 10.8 oz (106.9 kg) IBW/kg (Calculated) : 73  Adjusted Body Weight:   Vital Signs: Temp: 99.1 F (37.3 C) (07/02 1810) BP: 93/51 mmHg (07/02 1700) Pulse Rate: 142  (07/02 1810) Intake/Output from previous day: 07/01 0701 - 07/02 0700 In: 1043.9 [I.V.:763.9; NG/GT:30; IV Piggyback:250] Out: 350 [Urine:350] Intake/Output from this shift: Total I/O In: 2469.7 [I.V.:2177.2; Blood:12.5; NG/GT:30; IV Piggyback:250] Out: 915 [Urine:15; Other:900]  Labs:  [facility] 06/15/12 1720 06/15/12 1600 06/15/12 1207 06/15/12 0400 06/14/12 1817  WBC 31.0* -- 24.9* 24.4* --  HGB 7.4* -- 11.0* 10.7* --  PLT 391 -- 374 383 --  LABCREA -- -- -- -- --  CREATININE -- 2.35* -- 1.53* 1.01   Estimated Creatinine Clearance: 36.9 ml/min (by C-G formula based on Cr of 2.35). No results found for this basename: VANCOTROUGH:2,VANCOPEAK:2,VANCORANDOM:2,GENTTROUGH:2,GENTPEAK:2,GENTRANDOM:2,TOBRATROUGH:2,TOBRAPEAK:2,TOBRARND:2,AMIKACINPEAK:2,AMIKACINTROU:2,AMIKACIN:2, in the last 72 hours   Microbiology: Recent Results (from the past 720 hour(s))  CULTURE, BLOOD (ROUTINE X 2)     Status: Normal (Preliminary result)   Collection Time   06/14/12  1:20 PM      Component Value Range Status Comment   Specimen Description BLOOD ARM LEFT   Final    Special Requests BOTTLES DRAWN AEROBIC ONLY 10CC   Final    Culture  Setup Time 06/15/2012 02:15   Final    Culture     Final    Value: GRAM POSITIVE COCCI IN CLUSTERS     Note: Gram Stain Report Called to,Read Back By and Verified With: BURGUNDY Lybrand 06/15/12 1400 BY SMITHERSJ   Report Status PENDING   Incomplete   MRSA PCR SCREENING     Status: Normal   Collection Time   06/14/12  7:17 PM      Component Value Range  Status Comment   MRSA by PCR NEGATIVE  NEGATIVE Final   GRAM STAIN     Status: Normal   Collection Time   06/15/12 12:39 PM      Component Value Range Status Comment   Specimen Description WOUND LEFT LOWER LEG   Final    Special Requests NONE   Final    Gram Stain     Final    Value: ABUNDANT WBC PRESENT,BOTH PMN AND MONONUCLEAR     ABUNDANT GRAM POSITIVE COCCI IN PAIRS IN CLUSTERS   Report Status 06/15/2012 FINAL   Final     Medical History: Past Medical History  Diagnosis Date  . DM 06/20/2009  . HYPERLIPIDEMIA 06/20/2009  . HYPERTENSION 06/20/2009  . CORONARY ATHEROSCLEROSIS NATIVE CORONARY ARTERY 01/27/2011    s/p PCI 2/12  . Herpes zoster ophthalmicus   . Arthritis   . Obesity   . GERD (gastroesophageal reflux disease)   . Ischemic cardiomyopathy 2/12    EF 31%  . Adenocarcinoma of prostate     s/p seed implants  . Polymyalgia rheumatica     Medications:  Scheduled:    . antiseptic oral rinse  15 mL Mouth Rinse QID  . aspirin  325 mg Per Tube Daily  . chlorhexidine  15 mL Mouth Rinse BID  . clindamycin (CLEOCIN) IV  600 mg Intravenous Q8H  . clindamycin (CLEOCIN) IV  900 mg Intravenous STAT  . etomidate  20 mg Intravenous Once  . fentaNYL      .  heparin  4,000 Units Intravenous Once  . heparin subcutaneous  5,000 Units Subcutaneous Q8H  . hydrocortisone sod succinate (SOLU-CORTEF) injection  50 mg Intravenous STAT  . hydrocortisone sod succinate (SOLU-CORTEF) injection  50 mg Intravenous Q6H  . imipenem-cilastatin  500 mg Intravenous Q8H  . linezolid  600 mg Intravenous Q12H  . magnesium sulfate 1 - 4 g bolus IVPB  2 g Intravenous Once  . pantoprazole (PROTONIX) IV  40 mg Intravenous QHS  . piperacillin-tazobactam  3.375 g Intravenous Once  . rocuronium  50 mg Intravenous Once  . sodium chloride  1,000 mL Intravenous Once  . sodium chloride  1,000 mL Intravenous Once  . sodium chloride  2,000 mL Intravenous Once  . sodium chloride  750 mL Intravenous Once  .  vancomycin  1,000 mg Intravenous Once  . DISCONTD: clindamycin (CLEOCIN) IV  600 mg Intravenous Q6H  . DISCONTD: heparin  3,000 Units Intravenous Once  . DISCONTD: heparin  5,000 Units Subcutaneous Q8H  . DISCONTD: hydrocortisone sodium succinate  50 mg Intravenous Q6H  . DISCONTD: magnesium sulfate LVP 250-500 ml  2 g Intravenous Once  . DISCONTD: pantoprazole (PROTONIX) IV  40 mg Intravenous Daily  . DISCONTD: piperacillin-tazobactam (ZOSYN)  IV  3.375 g Intravenous Q8H  . DISCONTD: vancomycin  1,000 mg Intravenous Q12H  . DISCONTD: vancomycin  1,000 mg Other To OR   Assessment: Pt went to OR for I & D.  Post op pt went into refractory shock. Zosyn was D/C'd and Primaxin per Rx was ordered to cover for resp gram neg. CrCl was 56 ml/min and Primaxin 500 mg q8h was initially ordered. A later CrCl of 37 ml/min was noted and dose was changed to 250 mg q6h.    Plan:  Primaxin 250 mg q6h. Will follow renal fxn and adjust dose as needed.  Eugene Garnet 06/15/2012,6:14 PM

## 2012-06-15 NOTE — Op Note (Signed)
OPERATIVE REPORT  DATE OF SURGERY: 06/15/2012  PATIENT:  Wayne [name],  71 y.o. male  PRE-OPERATIVE DIAGNOSIS:  Left Posterior knee infection  POST-OPERATIVE DIAGNOSIS:  Left Posterior Knee Infection  PROCEDURE:  Procedure(s): IRRIGATION AND DEBRIDEMENT EXTREMITY from a posterior of the left by to the left ankle. Debridement of skin soft tissue muscle and bone. Placement of antibiotic beads with vancomycin and gentamicin with stimulant beads.  SURGEON:  Surgeon(s): Nadara Mustard, MD  ANESTHESIA:   general  EBL:  Minimal ML, abscess 900 cc  SPECIMEN:  No Specimen  TOURNIQUET:  * No tourniquets in log *  PROCEDURE DETAILS: Patient is a 71 year old gentleman who several months ago developed a hematoma in the popliteal fossa from anticoagulation. This slowly got better however patient had an acute onset of infection in the resolving hematoma. Patient was brought to the emergency room with elevated temperature elevated white cell count he was placed in the ICU overnight required intubation. Patient is brought urgently to the or at this time for irrigation and debridement of the abscess. Risks and benefits were discussed with the patient's wife including persistent infection nonhealing of the wound need for additional surgery risk of mortality. Patient's wife states she understands and wished to proceed at this time. Description of procedure patient brought to the or room intubated and then was placed in the prone position. The left lower extremity was prepped using DuraPrep and draped into a sterile field a Z-type incision was made starting proximal lateral over the thigh and extending transversely across the popliteal crease and extending distally medially down to the ankle and the posterior aspect the calf. There was a large purulent abscess this measured 600 cc in the drainage container. The wound was irrigated with normal saline skin soft tissue and muscle was debrided. This was also debrided  down to bone. After irrigation with 6 L of pulsatile lavage antibiotic beads were made with vancomycin and gentamicin these were placed in the wounds. 3 Penrose drains were also placed the wound was closed loosely with 2-0 nylon. The wound was covered with Adaptic orthopedic sponges AB dressing Kerlix and Coban. Patient was left intubated taken back to the ICU in guarded condition.  PLAN OF CARE: Admit to inpatient   PATIENT DISPOSITION:  ICU - intubated and critically ill.   Nadara Mustard, MD 06/15/2012 1:35 PM

## 2012-06-16 ENCOUNTER — Inpatient Hospital Stay (HOSPITAL_COMMUNITY): Payer: Medicare Other

## 2012-06-16 ENCOUNTER — Encounter (HOSPITAL_COMMUNITY): Payer: Self-pay | Admitting: Nurse Practitioner

## 2012-06-16 DIAGNOSIS — I319 Disease of pericardium, unspecified: Secondary | ICD-10-CM

## 2012-06-16 DIAGNOSIS — A419 Sepsis, unspecified organism: Secondary | ICD-10-CM

## 2012-06-16 DIAGNOSIS — I2510 Atherosclerotic heart disease of native coronary artery without angina pectoris: Secondary | ICD-10-CM

## 2012-06-16 DIAGNOSIS — I214 Non-ST elevation (NSTEMI) myocardial infarction: Secondary | ICD-10-CM

## 2012-06-16 LAB — CBC
HCT: 26.5 % — ABNORMAL LOW (ref 39.0–52.0)
Hemoglobin: 9.2 g/dL — ABNORMAL LOW (ref 13.0–17.0)
MCH: 26.8 pg (ref 26.0–34.0)
MCH: 27 pg (ref 26.0–34.0)
MCHC: 34 g/dL (ref 30.0–36.0)
MCHC: 34.7 g/dL (ref 30.0–36.0)
Platelets: 263 10*3/uL (ref 150–400)
RBC: 3.7 MIL/uL — ABNORMAL LOW (ref 4.22–5.81)
RDW: 15 % (ref 11.5–15.5)
RDW: 15.1 % (ref 11.5–15.5)

## 2012-06-16 LAB — TYPE AND SCREEN
Unit division: 0
Unit division: 0

## 2012-06-16 LAB — COMPREHENSIVE METABOLIC PANEL
ALT: 38 U/L (ref 0–53)
AST: 58 U/L — ABNORMAL HIGH (ref 0–37)
Albumin: 1.4 g/dL — ABNORMAL LOW (ref 3.5–5.2)
Albumin: 1.4 g/dL — ABNORMAL LOW (ref 3.5–5.2)
Alkaline Phosphatase: 86 U/L (ref 39–117)
BUN: 29 mg/dL — ABNORMAL HIGH (ref 6–23)
CO2: 18 mEq/L — ABNORMAL LOW (ref 19–32)
Calcium: 7.3 mg/dL — ABNORMAL LOW (ref 8.4–10.5)
Calcium: 6.9 mg/dL — ABNORMAL LOW (ref 8.4–10.5)
Creatinine, Ser: 2.9 mg/dL — ABNORMAL HIGH (ref 0.50–1.35)
GFR calc Af Amer: 22 mL/min — ABNORMAL LOW (ref >90)
Glucose, Bld: 112 mg/dL — ABNORMAL HIGH (ref 70–99)
Potassium: 3.4 mEq/L — ABNORMAL LOW (ref 3.5–5.1)
Sodium: 137 mEq/L (ref 135–145)
Total Protein: 4.7 g/dL — ABNORMAL LOW (ref 6.0–8.3)
Total Protein: 4.7 g/dL — ABNORMAL LOW (ref 6.0–8.3)

## 2012-06-16 LAB — GLUCOSE, CAPILLARY
Glucose-Capillary: 100 mg/dL — ABNORMAL HIGH (ref 70–99)
Glucose-Capillary: 121 mg/dL — ABNORMAL HIGH (ref 70–99)
Glucose-Capillary: 122 mg/dL — ABNORMAL HIGH (ref 70–99)
Glucose-Capillary: 128 mg/dL — ABNORMAL HIGH (ref 70–99)
Glucose-Capillary: 129 mg/dL — ABNORMAL HIGH (ref 70–99)
Glucose-Capillary: 136 mg/dL — ABNORMAL HIGH (ref 70–99)
Glucose-Capillary: 136 mg/dL — ABNORMAL HIGH (ref 70–99)
Glucose-Capillary: 139 mg/dL — ABNORMAL HIGH (ref 70–99)
Glucose-Capillary: 141 mg/dL — ABNORMAL HIGH (ref 70–99)
Glucose-Capillary: 145 mg/dL — ABNORMAL HIGH (ref 70–99)
Glucose-Capillary: 147 mg/dL — ABNORMAL HIGH (ref 70–99)
Glucose-Capillary: 192 mg/dL — ABNORMAL HIGH (ref 70–99)
Glucose-Capillary: 197 mg/dL — ABNORMAL HIGH (ref 70–99)
Glucose-Capillary: 225 mg/dL — ABNORMAL HIGH (ref 70–99)
Glucose-Capillary: 229 mg/dL — ABNORMAL HIGH (ref 70–99)

## 2012-06-16 LAB — URINE CULTURE

## 2012-06-16 LAB — MAGNESIUM
Magnesium: 1.6 mg/dL (ref 1.5–2.5)
Magnesium: 2 mg/dL (ref 1.5–2.5)

## 2012-06-16 LAB — POCT I-STAT 3, ART BLOOD GAS (G3+)
Bicarbonate: 18 mEq/L — ABNORMAL LOW (ref 20.0–24.0)
pH, Arterial: 7.416 (ref 7.350–7.450)
pO2, Arterial: 125 mmHg — ABNORMAL HIGH (ref 80.0–100.0)

## 2012-06-16 LAB — HEMOGLOBIN AND HEMATOCRIT, BLOOD: HCT: 27 % — ABNORMAL LOW (ref 39.0–52.0)

## 2012-06-16 MED ORDER — PRISMASOL BGK 4/2.5 32-4-2.5 MEQ/L IV SOLN
INTRAVENOUS | Status: DC
  Administered 2012-06-16 – 2012-06-19 (×5): via INTRAVENOUS_CENTRAL
  Filled 2012-06-16 (×8): qty 5000

## 2012-06-16 MED ORDER — MAGNESIUM SULFATE 40 MG/ML IJ SOLN
2.0000 g | Freq: Once | INTRAMUSCULAR | Status: AC
  Administered 2012-06-16: 2 g via INTRAVENOUS
  Filled 2012-06-16: qty 50

## 2012-06-16 MED ORDER — GLUCERNA 1.2 CAL PO LIQD
1000.0000 mL | ORAL | Status: DC
  Administered 2012-06-17: 1000 mL
  Filled 2012-06-16 (×7): qty 1000

## 2012-06-16 MED ORDER — MAGNESIUM SULFATE 40 MG/ML IJ SOLN
2.0000 g | INTRAMUSCULAR | Status: DC | PRN
  Filled 2012-06-16: qty 50

## 2012-06-16 MED ORDER — OSMOLITE 1.2 CAL PO LIQD
1000.0000 mL | ORAL | Status: DC
  Administered 2012-06-16: 20 mL
  Filled 2012-06-16 (×3): qty 1000

## 2012-06-16 MED ORDER — POTASSIUM CHLORIDE 10 MEQ/50ML IV SOLN
10.0000 meq | INTRAVENOUS | Status: AC
  Administered 2012-06-16 – 2012-06-17 (×5): 10 meq via INTRAVENOUS
  Filled 2012-06-16 (×5): qty 50

## 2012-06-16 MED ORDER — SODIUM CHLORIDE 0.9 % FOR CRRT
INTRAVENOUS_CENTRAL | Status: DC | PRN
  Filled 2012-06-16: qty 1000

## 2012-06-16 MED ORDER — POTASSIUM CHLORIDE 10 MEQ/50ML IV SOLN
INTRAVENOUS | Status: AC
  Filled 2012-06-16: qty 50

## 2012-06-16 MED ORDER — POTASSIUM CHLORIDE 10 MEQ/50ML IV SOLN
10.0000 meq | INTRAVENOUS | Status: AC
  Administered 2012-06-16 (×3): 10 meq via INTRAVENOUS
  Filled 2012-06-16 (×2): qty 50

## 2012-06-16 MED ORDER — PRISMASOL BGK 4/2.5 32-4-2.5 MEQ/L IV SOLN
INTRAVENOUS | Status: DC
  Administered 2012-06-16 – 2012-06-19 (×11): via INTRAVENOUS_CENTRAL
  Filled 2012-06-16 (×16): qty 5000

## 2012-06-16 MED ORDER — PRISMASOL BGK 4/2.5 32-4-2.5 MEQ/L IV SOLN
INTRAVENOUS | Status: DC
  Administered 2012-06-16 – 2012-06-19 (×6): via INTRAVENOUS_CENTRAL
  Filled 2012-06-16 (×8): qty 5000

## 2012-06-16 MED ORDER — HEPARIN SODIUM (PORCINE) 1000 UNIT/ML DIALYSIS
1000.0000 [IU] | INTRAMUSCULAR | Status: DC | PRN
  Filled 2012-06-16: qty 6
  Filled 2012-06-16: qty 3

## 2012-06-16 MED ORDER — PRO-STAT SUGAR FREE PO LIQD
30.0000 mL | Freq: Every day | ORAL | Status: DC
  Administered 2012-06-16 – 2012-06-18 (×8): 30 mL
  Filled 2012-06-16 (×14): qty 30

## 2012-06-16 MED ORDER — SODIUM CHLORIDE 0.9 % IV BOLUS (SEPSIS)
1000.0000 mL | Freq: Once | INTRAVENOUS | Status: AC
  Administered 2012-06-16: 1000 mL via INTRAVENOUS

## 2012-06-16 NOTE — Anesthesia Postprocedure Evaluation (Signed)
  Anesthesia Post-op Note2 Patient: Wayne [name]  Procedure(s) Performed: Procedure(s) (LRB): IRRIGATION AND DEBRIDEMENT EXTREMITY (Left)  Patient Location: PACU  Anesthesia Type: General  Level of Consciousness: awake  Airway and Oxygen Therapy: Patient Spontanous Breathing  Post-op Pain: mild  Post-op Assessment: Post-op Vital signs reviewed  Post-op Vital Signs: Reviewed  Complications: No apparent anesthesia complications

## 2012-06-16 NOTE — Progress Notes (Signed)
Name: Wayne [name] MRN: [id number] DOB: 12-17-1940    LOS: 2 Date of admit 06/14/2012 12:48 PM PCP is Kristian Covey, MD Ortho is Dr Jonathon Bellows  Referring Provider:  Dr Preston Fleeting of ER Reason for Referral:  Sepsis, hypotension, acidsis , hyperglycemia  PULMONARY / CRITICAL CARE MEDICINE   71 yr old septic shock, resp failure, leg source. S/p irrigation and debridement of LE on 7/2  Events Since Admission: 7/2- refractory shock post op, acidosis, arf, epi required 7/3- Improved pressors  Overnight: Pt returned from OR in critical condition requiring furhter pressors and fluids. Received 2 units PRBC  Current Status: Intubated and sedated.   Lines/Tubes: ETT 7/1>> NG Tube 7/1 >> Foley 7/1>>> RIJ 7/1>>> R Radial 7/1>>> RPIV 7/1>>> LPIV 7/1>>> Premrose Drains7/2>>>  ABX: Vanc 7/1 >>> 7/2 Zosyn 7/1 >>> 7/2 7/2 clinda>>> Linezolid 7/2>>> Imipenim 7/2>>>  Vital Signs: Temp:  [98.1 F (36.7 C)-101.8 F (38.8 C)] 100.4 F (38 C) (07/03 0600) Pulse Rate:  [103-144] 111  (07/03 0500) Resp:  [0-35] 8  (07/03 0600) BP: (52-132)/(30-63) 101/58 mmHg (07/03 0500) SpO2:  [94 %-100 %] 100 % (07/03 0500) Arterial Line BP: (57-132)/(27-73) 104/44 mmHg (07/03 0600) FiO2 (%):  [40 %] 40 % (07/03 0313)  Physical Examination: General:  Obese male. In bed. Looks critically ill  Neuro:  Curerntly RASS -1 HEENT:  PEERL + Neck:  No elevated jvp Cardiovascular:  Tachycardic, hypotensive, Normal heart sounds Lungs:  Coarse, intubated Abdomen:  Obese,  Musculoskeletal:  LLE - bandaged w/ premrose drains in place. LE bilat cool to touch w/ 2+ pulses.  Skin:  LLE bandages in place s/p surgery. Otherwise itnact  Principal Problem:  *Severe sepsis Active Problems:  HYPERTROPHY PROSTATE W/UR OBST & OTH LUTS  CORONARY ATHEROSCLEROSIS NATIVE CORONARY ARTERY  Polymyalgia rheumatica  Systolic CHF, chronic  OSA (obstructive sleep apnea)  Obesity  Metabolic acidosis  Cellulitis and abscess  of lower leg  Acute respiratory failure  Healthcare-associated pneumonia  Hyperglycemia  Acute encephalopathy   ASSESSMENT AND PLAN  PULMONARY  Lab 06/16/12 0544 06/15/12 1737 06/15/12 1720 06/15/12 1539 06/15/12 0417 06/14/12 2301  PHART 7.416 7.260* -- 7.255* 7.359 7.299*  PCO2ART 28.3* 28.8* -- 34.5* 29.3* 36.2  PO2ART 125.0* 125.0* -- 99.8 127.0* 93.0  HCO3 18.0* 12.9* -- 14.8* 16.2* 17.6*  O2SAT 99.0 98.0 76.5 97.2 98.0 --   Ventilator Settings: Vent Mode:  [-] PRVC FiO2 (%):  [40 %] 40 % Set Rate:  [20 bmp-30 bmp] 30 bmp Vt Set:  [550 mL] 550 mL PEEP:  [5 cmH20] 5 cmH20 Plateau Pressure:  [11 cmH20-17 cmH20] 16 cmH20 CXR:  06/14/2012 12:48 PM  - LL consolidation  ETT: 7/1>>>  A:  Acute Resp Distress due to metabolic acidosis +/- LLL PNA,  r/o ards, not impressed overload. CXR this am overall improved airation P:   abg reviewed, reduce rate pcxr in am, todays resolving , less likely a ARDS component NO weaning today as MV high and high presors abg in am   CARDIOVASCULAR  Lab 06/15/12 0950 06/14/12 2350 06/14/12 1818 06/14/12 1635 06/14/12 1634  TROPONINI 7.90* 5.17* -- -- 3.49*  LATICACIDVEN -- -- -- 3.9* --  PROBNP -- -- 24481.0* -- --   ECG: no St changes Lines:  RIJ 7/1>>> R Radial 7/1>>> RPIV 7/1>>> LPIV 7/1>>>  A: Severe sepsis and hypotension - Pt EF of 45%. bp improved w/ multiple pressors and significant fluid boluses. Now on Levo,  and Vaso. Heparin Drip DCd from  bleeding. Troponins continue to be elevated, likely from cardiac strain refractory shock requiring epi on top levo 75 on 7/2 P:  Levo much improved to 20 mics, continue vaso , MAP goal 60 with history ef 45% edgt completed 7/2 Echo ordered and pending to assess reg wall Asa notified dr Shirlee Latch, cards Stress steroids continue svo2 7/2 wnl, no dobut Tx for hgb greater 8.4 with ischemia cvp 4-5, may bolus further, vent needs improved  RENAL  Lab 06/16/12 0500 06/15/12 1600 06/15/12  0400 06/14/12 1817 06/14/12 1307  NA 137 135 138 136 132*  K 3.3* 4.0 -- -- --  CL 104 105 108 104 93*  CO2 18* 14* 16* 16* 12*  BUN 29* 26* 21 17 16   CREATININE 2.90* 2.35* 1.53* 1.01 1.04  CALCIUM 7.3* 7.6* 8.2* 7.7* 9.3  MG -- -- 1.4* 1.4* --  PHOS -- -- 2.8 2.2* --   Intake/Output      07/02 0701 - 07/03 0700   I.V. (mL/kg) 4875.8 (45.6)   Blood 386.5   NG/GT 50   IV Piggyback 950   Total Intake(mL/kg) 6262.3 (58.6)   Urine (mL/kg/hr) 40 (0)   Emesis/NG output 20   Other 900   Total Output 960   Net +5302.3        Foley: not placed at admit 06/14/2012 12:48 PM   A:  Severe metabolic acidosis, suspect due to sepsis lactic acid. Cr bump likely from AKI resulting in ATN from hypoperfusion from severe sepsis. Oliguric w/ 45 cc in past 24hrs., hypokalemia, mixed small AG  / non ag  P:   Consider temporary hemodialysis, cvvhd, will call renal, as 45 cc in 24 hrs output renal US High insenible losses, bolus x 1 further, cvp 5, levo 20 , o2 needs low Mag bmet in pm   GASTROINTESTINAL  Lab 06/16/12 0500 06/14/12 1817 06/14/12 1307  AST 58* 43* 36  ALT 38 23 27  ALKPHOS 67 97 83  BILITOT 0.8 0.3 0.5  PROT 4.7* 5.5* 6.9  ALBUMIN 1.4* 1.6* 2.1*    A:  None P:   Start TF ppi  HEMATOLOGIC  Lab 06/16/12 0500 06/15/12 2240 06/15/12 1720 06/15/12 1207 06/15/12 0400 06/14/12 1817  HGB 9.9* 9.5* 7.4* 11.0* 10.7* --  HCT 29.1* 28.8* 23.5* 34.7* 33.6* --  PLT 263 -- 391 374 383 317  INR -- -- -- -- -- 1.55*  APTT -- -- -- -- -- 38*   A:  Anemia of chrnic and critical illness. Received 2 units PRBC.  P:  - H&H Q6 per surgery, hgb threshold 8.4 for ischemia - cbc at 1700 -sub q heparin Hep if off 7/2 from bleeding concnerns post op  INFECTIOUS  Lab 06/16/12 0500 06/15/12 1720 06/15/12 1207 06/15/12 0400 06/14/12 1817 06/14/12 1634  WBC 19.8* 31.0* 24.9* 24.4* 16.5* --  PROCALCITON -- -- -- -- -- 7.60   Cultures: Results for orders placed during the hospital  encounter of 06/14/12  CULTURE, BLOOD (ROUTINE X 2)     Status: Normal (Preliminary result)   Collection Time   06/14/12  1:20 PM      Component Value Range Status Comment   Specimen Description BLOOD ARM LEFT   Final    Special Requests BOTTLES DRAWN AEROBIC ONLY 10CC   Final    Culture  Setup Time 06/15/2012 02:15   Final    Culture     Final    Value: GRAM POSITIVE COCCI IN CLUSTERS  Note: Gram Stain Report Called to,Read Back By and Verified With: BURGUNDY [place] 06/15/12 1400 BY SMITHERSJ   Report Status PENDING   Incomplete   CULTURE, BLOOD (ROUTINE X 2)     Status: Normal (Preliminary result)   Collection Time   06/14/12  1:40 PM      Component Value Range Status Comment   Specimen Description BLOOD LEFT HAND   Final    Special Requests BOTTLES DRAWN AEROBIC AND ANAEROBIC 10CC   Final    Culture  Setup Time 06/15/2012 02:15   Final    Culture     Final    Value: GRAM POSITIVE COCCI IN CLUSTERS     Note: Gram Stain Report Called to,Read Back By and Verified With: BRANDY DIN @ 1814 ON 06/15/12 BY GOLLD   Report Status PENDING   Incomplete   URINE CULTURE     Status: Normal   Collection Time   06/14/12  7:15 PM      Component Value Range Status Comment   Specimen Description URINE, RANDOM   Final    Special Requests NONE   Final    Culture  Setup Time 06/15/2012 01:48   Final    Colony Count NO GROWTH   Final    Culture NO GROWTH   Final    Report Status 06/16/2012 FINAL   Final   MRSA PCR SCREENING     Status: Normal   Collection Time   06/14/12  7:17 PM      Component Value Range Status Comment   MRSA by PCR NEGATIVE  NEGATIVE Final   GRAM STAIN     Status: Normal   Collection Time   06/15/12 12:39 PM      Component Value Range Status Comment   Specimen Description WOUND LEFT LOWER LEG   Final    Special Requests NONE   Final    Gram Stain     Final    Value: ABUNDANT WBC PRESENT,BOTH PMN AND MONONUCLEAR     ABUNDANT GRAM POSITIVE COCCI IN PAIRS IN CLUSTERS   Report Status  06/15/2012 FINAL   Final     Antibiotics: Anti-infectives     Start     Dose/Rate Route Frequency Ordered Stop   06/16/12 0000   imipenem-cilastatin (PRIMAXIN) 250 mg in sodium chloride 0.9 % 100 mL IVPB        250 mg 200 mL/hr over 30 Minutes Intravenous 4 times per day 06/15/12 1900     06/15/12 2200   linezolid (ZYVOX) IVPB 600 mg        600 mg 300 mL/hr over 60 Minutes Intravenous Every 12 hours 06/15/12 1712     06/15/12 1900   clindamycin (CLEOCIN) IVPB 600 mg        600 mg 100 mL/hr over 30 Minutes Intravenous 3 times per day 06/15/12 1126     06/15/12 1800   clindamycin (CLEOCIN) IVPB 600 mg  Status:  Discontinued        600 mg 100 mL/hr over 30 Minutes Intravenous 4 times per day 06/15/12 1125 06/15/12 1126   06/15/12 1800   imipenem-cilastatin (PRIMAXIN) 500 mg in sodium chloride 0.9 % 100 mL IVPB  Status:  Discontinued        500 mg 200 mL/hr over 30 Minutes Intravenous 3 times per day 06/15/12 1728 06/15/12 1900   06/15/12 1230   vancomycin (VANCOCIN) powder 1,000 mg  Status:  Discontinued        1,000 mg  Other To Surgery 06/15/12 1215 06/15/12 1712   06/15/12 1222   vancomycin (VANCOCIN) powder  Status:  Discontinued          As needed 06/15/12 1223 06/15/12 1323   06/15/12 1220   gentamicin (GARAMYCIN) injection  Status:  Discontinued          As needed 06/15/12 1222 06/15/12 1323   06/15/12 1130   clindamycin (CLEOCIN) IVPB 900 mg        900 mg 100 mL/hr over 30 Minutes Intravenous STAT 06/15/12 1123 06/15/12 1235   06/15/12 0600   vancomycin (VANCOCIN) IVPB 1000 mg/200 mL premix  Status:  Discontinued        1,000 mg 200 mL/hr over 60 Minutes Intravenous Every 12 hours 06/14/12 1745 06/15/12 1712   06/15/12 0200   piperacillin-tazobactam (ZOSYN) IVPB 3.375 g  Status:  Discontinued        3.375 g 12.5 mL/hr over 240 Minutes Intravenous Every 8 hours 06/14/12 1745 06/15/12 1657   06/14/12 1615   vancomycin (VANCOCIN) IVPB 1000 mg/200 mL premix         1,000 mg 200 mL/hr over 60 Minutes Intravenous  Once 06/14/12 1608 06/14/12 1924   06/14/12 1615  piperacillin-tazobactam (ZOSYN) IVPB 3.375 g       3.375 g 100 mL/hr over 30 Minutes Intravenous  Once 06/14/12 1608 06/14/12 1854   06/14/12 1600   vancomycin (VANCOCIN) IVPB 1000 mg/200 mL premix        1,000 mg 200 mL/hr over 60 Minutes Intravenous  Once 06/14/12 1547 06/14/12 1721   06/14/12 1545   vancomycin (VANCOCIN) injection 1,000 mg  Status:  Discontinued        1,000 mg Intravenous  Once 06/14/12 1532 06/14/12 1547           A:  Sepsis w/ G+ cocci in clusters. Complete speciation and sensistivities pending, likely SA. Cellulitis LLE w/ Abscess, s/p irriagation and debridement POD1. Febrile and Tachy Likely toxic shock appearance from staph P:   Cont. Linezolid, imipenem Likely can dc clinda with addition lzyvox as zyvox also inhibits toxin production Follow final of wound  ENDOCRINE  Lab 06/16/12 0546 06/16/12 0515 06/16/12 0357 06/16/12 0258 06/16/12 0210  GLUCAP 145* 141* 201* 197* 192*   A:  Hyperglycemia likely reaction to sepsis and not DKA . Known diabetic on oral agents. Cortisol borderline P:   No oral agents in hospital ICU hyperglycemia protocol Cortisol just borderline under the stress he was under, maintain stress steroids  NEUROLOGIC  A:  Confused at admit. Normalized with fluids and bp increase. Fentanyl 148mcg/hr P:   WUA Ciont fent  GLOBAL  - He said he would rather die rather than amputation  BEST PRACTICE / DISPOSITION Level of Care:  icu Primary Service:  pccm Consultants:  Ortho Dr Lajoyce Corners  Code Status:  Full  Diet:  TF 7/3 DVT Px:  Sq heparin GI Px:  ppi Skin Integrity:  Intact other than LLE Social / Family:  Wife dpoa updated  Shelly Flatten, MD Family Medicine PGY-2 06/16/2012, 6:52 AM  I have fully examined this patient and agree with above findings.    And edited in full  Ccm time 50 min   Mcarthur Rossetti. Tyson Alias, MD,  FACP Pgr: [phone] [facility]

## 2012-06-16 NOTE — Procedures (Signed)
Hemodialysis Catheter Insertion Procedure Note   Wayne [name] [id number] 1941/01/17  Procedure: Insertion of Central Venous Catheter Indications: Assessment of intravascular volume, Drug and/or fluid administration and Frequent blood sampling  Procedure Details Consent: Risks of procedure as well as the alternatives and risks of each were explained to the (patient/caregiver).  Consent for procedure obtained. Time Out: Verified patient identification, verified procedure, site/side was marked, verified correct patient position, special equipment/implants available, medications/allergies/relevent history reviewed, required imaging and test results available.  Performed  Maximum sterile technique was used including antiseptics, cap, gloves, gown, hand hygiene, mask and sheet. Skin prep: Chlorhexidine; local anesthetic administered A triple lumen HD catheter was placed in the left internal jugular vein using the Seldinger technique.  Evaluation Blood flow good Complications: No apparent complications Patient did tolerate procedure well. Chest X-ray ordered to verify placement.  CXR: pending.  Procedure performed under direct supervision of Dr. Tyson Alias and with ultrasound guidance.    Assisted Resident Dr. Peggyann Shoals, NP-C [facility] Pgr: [phone] or [phone]  I scrubbed in and performed this procedure with above asst. Tolerated well pcxr wnl [place] gudiance  Mcarthur Rossetti. Tyson Alias, MD, FACP Pgr: [phone] [facility]'

## 2012-06-16 NOTE — Progress Notes (Signed)
INITIAL ADULT NUTRITION ASSESSMENT Date: 06/16/2012   Time: 12:23 PM  INTERVENTION:  To promote good glycemic control to support wound healing, will change TF to Glucerna 1.2 at 20 ml/h, increase by 10 ml every 4 hours to goal rate of 45 ml/h with Prostat 30 ml 5 times daily to provide 1796 kcals, 140 grams protein, 869 ml free water daily.   Reason for Assessment: MD Consult for TF initiation and management  ASSESSMENT: Male 71 y.o.  Dx: Severe sepsis, hypotension, acidosis, hyperglycemia; cellulitis and abscess of lower leg  Hx:  Past Medical History  Diagnosis Date  . DM   . HYPERLIPIDEMIA   . HYPERTENSION   . CORONARY ATHEROSCLEROSIS NATIVE CORONARY ARTERY     a. 01/2011 Cath/PCI: LM nl, LAD 40-50p, D1 80-small, LCX 95-small, RI 90, RCA 100, EF 20%;  b. 01/2011 Card MRI - No transmural scar;  c. 01/2011 PCI RCA->5 Promus DES, RI->3.0x16 Promus DES;  . Herpes zoster ophthalmicus   . Arthritis   . Obesity   . GERD (gastroesophageal reflux disease)   . Ischemic cardiomyopathy     a. 01/2012 Echo EF 45%, mild LVH.  Marland Kitchen Adenocarcinoma of prostate     s/p seed implants  . Polymyalgia rheumatica   . Noncompliance   . OSA (obstructive sleep apnea)   . Hematoma of leg     a. left leg hematoma 03/2012 - came off asa/effient  . Cellulitis of left leg     a. 05/2012 complicated by septic shock    Past Surgical History  Procedure Date  . Prostate surgery     cancer, seed implant  . Coronary stent placement     reports 6 stents placed     Related Meds:  Scheduled Meds:   . antiseptic oral rinse  15 mL Mouth Rinse QID  . aspirin  325 mg Per Tube Daily  . chlorhexidine  15 mL Mouth Rinse BID  . clindamycin (CLEOCIN) IV  900 mg Intravenous STAT  . heparin subcutaneous  5,000 Units Subcutaneous Q8H  . hydrocortisone sod succinate (SOLU-CORTEF) injection  50 mg Intravenous Q6H  . imipenem-cilastatin  250 mg Intravenous Q6H  . linezolid  600 mg Intravenous Q12H  . magnesium sulfate  1 - 4 g bolus IVPB  2 g Intravenous Once  . magnesium sulfate 1 - 4 g bolus IVPB  2 g Intravenous Once  . pantoprazole (PROTONIX) IV  40 mg Intravenous QHS  . potassium chloride  10 mEq Intravenous Q1 Hr x 3  . potassium chloride      . sodium chloride  1,000 mL Intravenous Once  . sodium chloride  1,000 mL Intravenous Once  . sodium chloride  2,000 mL Intravenous Once  . DISCONTD: clindamycin (CLEOCIN) IV  600 mg Intravenous Q8H  . DISCONTD: imipenem-cilastatin  500 mg Intravenous Q8H  . DISCONTD: piperacillin-tazobactam (ZOSYN)  IV  3.375 g Intravenous Q8H  . DISCONTD: vancomycin  1,000 mg Intravenous Q12H  . DISCONTD: vancomycin  1,000 mg Other To OR   Continuous Infusions:   . dextrose    . feeding supplement (OSMOLITE 1.2 CAL) 20 mL (06/16/12 1117)  . fentaNYL infusion INTRAVENOUS 100 mcg/hr (06/16/12 0800)  . insulin (NOVOLIN-R) infusion 9.9 Units/hr (06/16/12 1000)  . norepinephrine (LEVOPHED) Adult infusion 30 mcg/min (06/16/12 1000)  . vasopressin (PITRESSIN) infusion - *FOR SHOCK* 0.03 Units/min (06/15/12 1200)  . DISCONTD: epinephrine Stopped (06/16/12 0211)  . DISCONTD: heparin Stopped (06/15/12 1700)  . DISCONTD: heparin 1,500 Units/hr (06/15/12  1500)  . DISCONTD: norepinephrine (LEVOPHED) Adult infusion 70 mcg/min (06/15/12 1613)  . DISCONTD: phenylephrine (NEO-SYNEPHRINE) Adult infusion 100 mcg/min (06/15/12 1500)  . DISCONTD:  sodium bicarbonate infusion 1000 mL    . DISCONTD: vasopressin (PITRESSIN) infusion - *FOR SHOCK* 0.03 Units/min (06/15/12 1900)   PRN Meds:.sodium chloride, acetaminophen (TYLENOL) oral liquid 160 mg/5 mL, dextrose, fentaNYL, magnesium sulfate 1 - 4 g bolus IVPB, metoprolol, midazolam, sodium chloride, DISCONTD: 0.9 % irrigation (POUR BTL), DISCONTD: gentamicin, DISCONTD: sodium chloride irrigation, DISCONTD: vancomycin   Ht: 5\' 10"  (177.8 cm)  Wt: 235 lb 10.8 oz (106.9 kg)  Ideal Wt: 75.5 kg % Ideal Wt: 142%  Wt Readings from Last 14  Encounters:  06/15/12 235 lb 10.8 oz (106.9 kg)  06/15/12 235 lb 10.8 oz (106.9 kg)  05/03/12 232 lb (105.235 kg)  04/17/12 242 lb 11.6 oz (110.1 kg)  04/11/12 240 lb (108.863 kg)  04/06/12 246 lb (111.585 kg)  03/18/12 251 lb (113.853 kg)  03/09/12 250 lb 12.8 oz (113.762 kg)  01/07/12 246 lb (111.585 kg)  10/24/11 238 lb (107.956 kg)  10/14/11 238 lb 3.2 oz (108.047 kg)  10/14/11 235 lb (106.595 kg)  09/02/11 231 lb 12.8 oz (105.144 kg)  06/30/11 225 lb (102.059 kg)    Usual Wt: 240 lb (2 months ago) % Usual Wt: 98%  Body mass index is 33.82 kg/(m^2). class 1 obesity  Food/Nutrition Related Hx: No nutrition problems identified on admission nutrition screen.  Patient's sons report that patient has lost a little weight by trying to follow a heart healthy diabetic diet for the past year since his heart attack.  He does not follow his diet very well.  Intake was good PTA.  Labs:  CMP     Component Value Date/Time   NA 137 06/16/2012 0500   K 3.3* 06/16/2012 0500   CL 104 06/16/2012 0500   CO2 18* 06/16/2012 0500   GLUCOSE 156* 06/16/2012 0500   BUN 29* 06/16/2012 0500   CREATININE 2.90* 06/16/2012 0500   CREATININE 0.78 06/30/2011 1702   CALCIUM 7.3* 06/16/2012 0500   PROT 4.7* 06/16/2012 0500   ALBUMIN 1.4* 06/16/2012 0500   AST 58* 06/16/2012 0500   ALT 38 06/16/2012 0500   ALKPHOS 67 06/16/2012 0500   BILITOT 0.8 06/16/2012 0500   GFRNONAA 21* 06/16/2012 0500   GFRAA 24* 06/16/2012 0500   Potassium  Date/Time Value Range Status  06/16/2012  5:00 AM 3.3* 3.5 - 5.1 mEq/L Final  06/15/2012  4:00 PM 4.0  3.5 - 5.1 mEq/L Final  06/15/2012  4:00 AM 3.6  3.5 - 5.1 mEq/L Final   Phosphorus  Date/Time Value Range Status  06/15/2012  4:00 AM 2.8  2.3 - 4.6 mg/dL Final  08/15/4781  9:56 PM 2.2* 2.3 - 4.6 mg/dL Final    CBG (last 3)   Basename 06/16/12 0546 06/16/12 0515 06/16/12 0357  GLUCAP 145* 141* 201*     Intake/Output Summary (Last 24 hours) at 06/16/12 1227 Last data filed at 06/16/12 1000   Gross per 24 hour  Intake 8233.88 ml  Output    950 ml  Net 7283.88 ml     Diet Order: NPO  Supplements/Tube Feeding: Osmolite 1.2 with goal rate of 40 ml/h will provide 1152 kcals, 53 grams protein, 787 ml free water daily.  IVF:    dextrose   feeding supplement (OSMOLITE 1.2 CAL) Last Rate: 20 mL (06/16/12 1117)  fentaNYL infusion INTRAVENOUS Last Rate: 100 mcg/hr (06/16/12 0800)  insulin (  NOVOLIN-R) infusion Last Rate: 9.9 Units/hr (06/16/12 1000)  norepinephrine (LEVOPHED) Adult infusion Last Rate: 30 mcg/min (06/16/12 1000)  vasopressin (PITRESSIN) infusion - *FOR SHOCK* Last Rate: 0.03 Units/min (06/15/12 1200)  DISCONTD: epinephrine Last Rate: Stopped (06/16/12 0211)  DISCONTD: heparin Last Rate: Stopped (06/15/12 1700)  DISCONTD: heparin Last Rate: 1,500 Units/hr (06/15/12 1500)  DISCONTD: norepinephrine (LEVOPHED) Adult infusion Last Rate: 70 mcg/min (06/15/12 1613)  DISCONTD: phenylephrine (NEO-SYNEPHRINE) Adult infusion Last Rate: 100 mcg/min (06/15/12 1500)  DISCONTD:  sodium bicarbonate infusion 1000 mL   DISCONTD: vasopressin (PITRESSIN) infusion - *FOR SHOCK* Last Rate: 0.03 Units/min (06/15/12 1900)    Estimated Nutritional Needs:   Kcal: 2550 Protein: 140-155 grams Fluid: 2.5-2.6 liters  TF was ordered this morning via adult enteral feeding protocol.  Osmolite 1.2 is currently running at 20 ml/h.  Patient is obese with BMI=33.8.  Patient with cellulitis and abscess of lower leg--needs good glycemic control to promote healing.  NUTRITION DIAGNOSIS: -Inadequate oral intake (NI-2.1).  Status: Ongoing  RELATED TO: inability to eat   AS EVIDENCED BY: NPO status  MONITORING/EVALUATION(Goals):   Goal:  Enteral nutrition to provide 60-70% of estimated calorie needs (22-25 kcals/kg ideal body weight) and 100% of estimated protein needs, based on ASPEN guidelines for permissive underfeeding in critically ill obese individuals.  Monitor:  TF tolerance/adequacy,  weight trend, labs, I/O   EDUCATION NEEDS: -Education not appropriate at this time    DOCUMENTATION CODES Per approved criteria  -Obesity Unspecified     Joaquin Courts, RD, CNSC, LDN Pager# [phone] After Hours Pager# [phone]  06/16/2012, 12:23 PM

## 2012-06-16 NOTE — Progress Notes (Signed)
Patient ID: Wayne [name], male   DOB: [birth year], 71 y.o.   MRN: [id number] Clear serosanginous drainage as expected, Hgb 9.9, WBC better, Mg 1.4- replace.  Repeat irrigation and debriedment surgery on hold for now until he's more stable.

## 2012-06-16 NOTE — Progress Notes (Signed)
Inpatient Diabetes Program Recommendations  AACE/ADA: New Consensus Statement on Inpatient Glycemic Control (2009)  Target Ranges:  Prepandial:   less than 140 mg/dL      Peak postprandial:   less than 180 mg/dL (1-2 hours)      Critically ill patients:  140 - 180 mg/dL   Reason for Visit: Results for JAROM, GOVAN (MRN [id number]) as of 06/16/2012 09:52  Ref. Range 06/16/2012 01:23 06/16/2012 02:10 06/16/2012 02:58 06/16/2012 03:57 06/16/2012 05:15  Glucose-Capillary Latest Range: 70-99 mg/dL 409 (H) 811 (H) 914 (H) 201 (H) 141 (H)    Inpatient Diabetes Program Recommendations Insulin - IV drip/GlucoStabilizer: CBG's increased overnight requiring high insulin drip rates.  Continue IV insulin.  Currently not meeting transition criteria. HgbA1C: Note A1C=11.4% indicating poor glycemic control prior to admit.  Note: Will follow.

## 2012-06-16 NOTE — Progress Notes (Signed)
  Echocardiogram 2D Echocardiogram has been performed.  Wayne [name] 06/16/2012, 10:17 AM

## 2012-06-16 NOTE — Consult Note (Signed)
CARDIOLOGY CONSULT NOTE  Patient ID: CALIBER LANDESS MRN: [id number], DOB/AGE: Feb 22, 1941   Admit date: 06/14/2012 Date of Consult: 06/16/2012   Primary Physician: Kristian Covey, MD Primary Cardiologist: Golden Circle, MD  Pt. Profile  71 y/o male with h/o CAD and ICM who was admitted with cellulitis and sepsis, whom we've been asked to eval 2/2 elevated troponin.  Problem List  Past Medical History  Diagnosis Date  . DM   . HYPERLIPIDEMIA   . HYPERTENSION   . CORONARY ATHEROSCLEROSIS NATIVE CORONARY ARTERY     a. 01/2011 Cath/PCI: LM nl, LAD 40-50p, D1 80-small, LCX 95-small, RI 90, RCA 100, EF 20%;  b. 01/2011 Card MRI - No transmural scar;  c. 01/2011 PCI RCA->5 Promus DES, RI->3.0x16 Promus DES;  . Herpes zoster ophthalmicus   . Arthritis   . Obesity   . GERD (gastroesophageal reflux disease)   . Ischemic cardiomyopathy     a. 01/2012 Echo EF 45%, mild LVH.  Marland Kitchen Adenocarcinoma of prostate     s/p seed implants  . Polymyalgia rheumatica   . Noncompliance   . OSA (obstructive sleep apnea)   . Hematoma of leg     a. left leg hematoma 03/2012 - came off asa/effient  . Cellulitis of left leg     a. 05/2012 complicated by septic shock    Past Surgical History  Procedure Date  . Prostate surgery     cancer, seed implant  . Coronary stent placement     reports 6 stents placed    Allergies  Allergies  Allergen Reactions  . Atorvastatin     REACTION: sore legs    HPI   71 y/o male with the above problem list.  He has a h/o ICM and CAD dating back to early 2012, when he presented with acute dyspnea and chf and was found to have an EF of 20%.  Subsequent eval revealed severe RCA and Ramus dzs.  He was placed on med Rx and underwent cardiac MRI revealing no evidence of transmural scar and subsequently underwent PCI/DES to the RCA (5 DES) and Ramus (1 DES).  Follow-up echo earlier this year showed EF of 45%.  Pt was doing well from a cardiac standpoint and in April of this year, he  developed a left lower ext hematoma.  This became associated with edema and weeping with some skin discoloration according to his wife.  She took him off of all of his meds for a period of about 2 wks b/c she said that he was out of sorts after taking them (bb, acei, metformin, glipizide, asa, effient) and b/c she realized that asa/effient may be making things worse.  He was eval by ortho and leg was managed conservatively with wraps and he was improving.  When he was seen in our office in May, some of his CHF meds were resumed.  According to notes, his leg was healing well however on the day prior to admission, pt began to experience fever, chills, and worsening leg pain.  He presented to [facility] where he was hypotensive and febrile with significant leukocytosis.  He was admitted and 2/2 resp failure and ongoing hypotn, felt to be septic shock, he was intubated, sedated, and placed on vasopressors.  He was also aggressively hydrated.  He was seen by surgery re: hematoma/cellulitis and underwent debridement yesterday.  Post-op, he was having oozing and req transfusion.  Throughout his brief hospitalization, he has been noted to elevation of CE with peak  CK/MB/Ti of  255/11.5/7.9 respectively.  ECG shows new lateral st/t changes.  Inpatient Medications     . antiseptic oral rinse  15 mL Mouth Rinse QID  . aspirin  325 mg Per Tube Daily  . chlorhexidine  15 mL Mouth Rinse BID  . clindamycin (CLEOCIN) IV  900 mg Intravenous STAT  . heparin subcutaneous  5,000 Units Subcutaneous Q8H  . hydrocortisone sod succinate (SOLU-CORTEF) injection  50 mg Intravenous STAT  . hydrocortisone sod succinate (SOLU-CORTEF) injection  50 mg Intravenous Q6H  . imipenem-cilastatin  250 mg Intravenous Q6H  . linezolid  600 mg Intravenous Q12H  . magnesium sulfate 1 - 4 g bolus IVPB  2 g Intravenous Once  . magnesium sulfate 1 - 4 g bolus IVPB  2 g Intravenous Once  . pantoprazole (PROTONIX) IV  40 mg Intravenous QHS  .  potassium chloride  10 mEq Intravenous Q1 Hr x 3  . potassium chloride      . sodium chloride  1,000 mL Intravenous Once  . sodium chloride  1,000 mL Intravenous Once  . sodium chloride  2,000 mL Intravenous Once  . sodium chloride  750 mL Intravenous Once   Family History Family History  Problem Relation Age of Onset  . Cancer Mother 91    unknown CA  . Heart disease Father 41    MI, died  . Alcohol abuse Father     Social History History   Social History  . Marital Status: Married    Spouse Name: N/A    Number of Children: Y  . Years of Education: N/A   Occupational History  . RETIRED     TRUCK DRIVER   Social History Main Topics  . Smoking status: Former Smoker -- 0.3 packs/day for 20 years    Types: Cigarettes    Quit date: 02/23/1989  . Smokeless tobacco: Never Used  . Alcohol Use: No  . Drug Use: No  . Sexually Active: Not on file   Other Topics Concern  . Not on file   Social History Narrative   Retired Naval architect     Review of Systems  Obtained from wife as pt is intubated.  She is not aware of any chest pain or sob prior to hospitalization.  Biggest complaints revolved around knee pain, swelling, and weeping.  Today, pt nods yes when asked if he has abdominal tenderness. All other systems reviewed and are otherwise negative except as noted above.  Physical Exam  Blood pressure 126/68, pulse 100, temperature 99.5 F (37.5 C), temperature source Core (Comment), resp. rate 30, height 5\' 10"  (1.778 m), weight 235 lb 10.8 oz (106.9 kg), SpO2 100.00%. cvp 4 General: Pleasant, groggy, NAD, mildly diaphoretic. Psych: intubated. Unable to fully assess. Neuro: intubated.  Unable to fully assess. HEENT: Normal  Neck: obese - difficult to assess for jvd.  No apparent bruits. Lungs:  Resp regular and unlabored, diminished bs bilat. Heart: RRR no s3, s4, or murmurs. - tachy. Abdomen: Soft, diffusely tender, non-distended, BS + x 4.  Extremities: No clubbing,  cyanosis.  Trace RLE edema.  Left leg is wrapped.  DP/PT 1+ and equal bilaterally.  Labs   [facility] 06/15/12 0950 06/14/12 2350 06/14/12 1634  CKTOTAL 242* 239* 255*  CKMB 8.1* 10.1* 11.5*  TROPONINI 7.90* 5.17* 3.49*   Lab Results  Component Value Date   WBC 19.8* 06/16/2012   HGB 9.9* 06/16/2012   HCT 29.1* 06/16/2012   MCV 78.6 06/16/2012   PLT  263 06/16/2012     Lab 06/16/12 0500  NA 137  K 3.3*  CL 104  CO2 18*  BUN 29*  CREATININE 2.90*  CALCIUM 7.3*  PROT 4.7*  BILITOT 0.8  ALKPHOS 67  ALT 38  AST 58*  GLUCOSE 156*   Radiology/Studies  Dg Chest Port 1 View  06/16/2012  *RADIOLOGY REPORT*  Clinical Data: Check endotracheal tube  PORTABLE CHEST - 1 VIEW  Comparison: June 15, 2012  Findings: Stable tubular devices.  Bibasilar airspace disease verses atelectasis left greater than right improved.  Left apical pleural thickening likely related to pleural fluid in a recumbent patient.  No pneumothorax.  Normal vascularity.  IMPRESSION: Improved bibasilar airspace disease verses atelectasis.  Original Report Authenticated By: Donavan Burnet, M.D.   ECG  St, 109, poor r prog, inf infarct, lat st dep with twi (new)  ASSESSMENT AND PLAN  1.  Septic Shock/LLE Cellulitis: s/p debridement yesterday.  Pressors and abx per CCM.  2.  Type II NSTEMI/CAD:  Pt has not had chest pain or dyspnea.  He has elevated cardiac markers in the setting of severe sepsis complicated by hypotension, renal failure, anemia, and resp failure.  Echo just completed and we will review.  At this time, continue conservative mgmt.  Bc of hypotension, he is not a candidate for beta blocker therapy.  With anemia and bleeding from hematoma, hold on anticoagulation.  Will follow closely and consider ischemic eval once acute issues resolve.  3.  ICM/Chronic Systolic CHF:  Volume currently stable with CVP of 4.  Will review echo.  Continue to follow closely as he has req IVF hydration and transfusions.  Cannot use  bb/acei/arb @ this time 2/2 hypotension and acute renal failure.  4. Acute renal failure:  Creat rising since admission in setting of above.  Per CCM.  5.  Hypokalemia:  Supp.  6.  Anemia:  Follow.  Signed, Nicolasa Ducking, NP 06/16/2012, 9:40 AM   Patient seen and examined independently. Gilford Raid, NP note reviewed carefully - agree with his assessment and plan. I have edited the note based on my findings. NSTEMI likely due to demand ischemia. This is asymptomatic. Not candidate for cath due to renal failure and inability to take DAPT. Continue medical management/supportive care. Can restart ASA and b-blocker as tolerated. Can consider outpatient Myoview at some point to evaluate burden of residual ischemia.   Jackee Glasner,MD 11:20 AM

## 2012-06-16 NOTE — Consult Note (Addendum)
[name]        RENAL CONSULT   Reason for Consult: Oliguria, worsening renal function in patient with left posterior knee infection. Following irrigation and debridement yesterday [name] became hypotensive and acidotic. He has been oliguric since that time Referring Physician: Renal consult requested Dr. Tyson Alias His primary care physician is Dr. Evelena Peat. His [name] provided much of the information. Phone numbers: [phone], [phone].  Wayne [name] is a 71 y.o. white man admitted on 06/14/2000 with left leg cellulitis (he has had this for over 3 months. It had gotten worse prior to admission. When seen in the ER he had hypotension. He then became acidotic with  confusion, chills, temperature 102. He required intubation, pressors, surgical debridement of the cellulitis/posterior knee infection. Hypotension and acidosis worsened following surgery    Past Medical History  Diagnosis Date  . DM   . HYPERLIPIDEMIA   . HYPERTENSION   . CORONARY ATHEROSCLEROSIS NATIVE CORONARY ARTERY     a. 01/2011 Cath/PCI: LM nl, LAD 40-50p, D1 80-small, LCX 95-small, RI 90, RCA 100, EF 20%;  b. 01/2011 Card MRI - No transmural scar;  c. 01/2011 PCI RCA->5 Promus DES, RI->3.0x16 Promus DES;  . Herpes zoster ophthalmicus   . Arthritis   . Obesity   . GERD (gastroesophageal reflux disease)   . Ischemic cardiomyopathy     a. 01/2012 Echo EF 45%, mild LVH.  Marland Kitchen Adenocarcinoma of prostate     s/p seed implants  . Polymyalgia rheumatica   . Noncompliance   . OSA (obstructive sleep apnea)   . Hematoma of leg     a. left leg hematoma 03/2012 - came off asa/effient  . Cellulitis of left leg     a. 05/2012 complicated by septic shock    Family History  Problem Relation Age of Onset  . Cancer Mother 6    unknown CA  . Heart disease Father 34    MI, died  . Alcohol abuse Father   He has a son Kathlene November, [place], who lives in [place]. He has 2 brothers  and one sister who are healthy. 2 brothers died as infants. No family history renal disease.  Social History: He was born in [place], [place]. he finished eighth grade. 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 He was married for the first time for 10 years (divorce). He and his second wife have been married for 34 years. He has 30-pack-year history of cigarette smoking (he has not smoked for 20 years). He does not drink alcohol  Allergies:  Allergies  Allergen Reactions  . Atorvastatin     REACTION: sore legs    Meds prior to admission: Aspirin 80 one/D., Amaryl 4/D., metformin 1G/D., nitroglycerin when necessary, EFFIENT 10/D.  Current meds: levophed  30 MCG/min and, vasopressin 0.03 units/min, fentanyl drip, Glucerna 45 mL/min and via panda, Primaxin 250 every 6 hours, Zyvox 600 every 12 hours, aspirin 80 one/D., hydrocortisone 50 mg every 6 hours IV, IV heparin   US Renal  06/16/2012  *RADIOLOGY REPORT*  Clinical Data: Acute renal failure, diabetes, history of hypertension  RENAL/URINARY TRACT ULTRASOUND COMPLETE  Comparison:  None.  Findings:  Right Kidney:  No hydronephrosis is seen.  The right kidney measures 11.7 cm sagittally.  The echogenicity of the renal parenchyma appears to be within normal limits.  Left Kidney:  No hydronephrosis is noted.  The left kidney measures 12.5 cm.  The echogenicity  of the renal parenchyma is within normal limits.  Bladder:  The urinary bladder is decompressed with a Foley catheter.  IMPRESSION: No hydronephrosis.  The urinary bladder is decompressed with Foley catheter.  Original Report Authenticated By: Juline Patch, M.D.   Dg Chest Port 1 View  06/16/2012  *RADIOLOGY REPORT*  Clinical Data: Check endotracheal tube  PORTABLE CHEST - 1 VIEW  Comparison: June 15, 2012  Findings: Stable tubular devices.  Bibasilar airspace disease verses atelectasis left greater than right improved.  Left apical pleural thickening likely related to pleural  fluid in a recumbent patient.  No pneumothorax.  Normal vascularity.  IMPRESSION: Improved bibasilar airspace disease verses atelectasis.  Original Report Authenticated By: Donavan Burnet, M.D.   Portable Chest Xray In Am  06/15/2012  *RADIOLOGY REPORT*  Clinical Data: Ventilator dependent respiratory failure.  Follow up basilar atelectasis and/or pneumonia.  PORTABLE CHEST - 1 VIEW 06/15/2012 0608 hours:  Comparison: Portable chest x-rays yesterday and two-view chest x- ray 04/11/2012.  Findings: Endotracheal tube withdrawn such that its tip is now in satisfactory position approximately 4 cm above the carina. Nasogastric tube courses below the diaphragm into the stomach. Right jugular central venous catheter tip in the SVC.  Stable dense consolidation in the left lower lobe and mild patchy opacities at the right lung base.  No new pulmonary parenchymal abnormalities. Cardiac silhouette mildly enlarged but stable.  Pulmonary vascularity normal.  IMPRESSION:  1.  Endotracheal tube tip now in satisfactory position approximately 4 cm above the carina.  Remaining support apparatus satisfactory. 2.  Stable dense left lower lobe atelectasis and/or pneumonia and mild patchy right basilar atelectasis and/or pneumonia.  No new abnormalities.  Original Report Authenticated By: Arnell Sieving, M.D.   Dg Chest Port 1 View  06/14/2012  *RADIOLOGY REPORT*  Clinical Data: Intubation.  PORTABLE CHEST - 1 VIEW  Comparison: 06/14/2012  Findings: Endotracheal tube is 6 mm above the level of the carina, directed toward the left main stem bronchus.  Recommend retracting 2 cm.  Right central line tip remains in the SVC, unchanged. Increasing bibasilar atelectasis or infiltrates, left greater than right.  Cardiomegaly. Possible small left effusion.  IMPRESSION: Endotracheal tube near the level of the carina, directed toward the left main stem bronchus.  Recommend retracting 2 cm.  Increasing bibasilar atelectasis or infiltrates,  left greater than right with small left effusion suspected.  Original Report Authenticated By: Cyndie Chime, M.D.   Dg Chest Port 1 View  06/14/2012  *RADIOLOGY REPORT*  Clinical Data: Evaluate IJ catheter placement  PORTABLE CHEST - 1 VIEW  Comparison: 06/14/2012  Findings:  Right IJ catheter tip is in the SVC.  No pneumothorax identified.  Heart size is mildly enlarged.  No pleural effusion or edema.  No airspace consolidation.  IMPRESSION: The right IJ catheter is in satisfactory position within the SVC. No pneumothorax identified.  Original Report Authenticated By: Rosealee Albee, M.D.    ROS- unable to obtain  Blood pressure 98/59, pulse 115, temperature 99 F (37.2 C), temperature source Core (Comment), resp. rate 30, height 5\' 10"  (1.778 m), weight 106.9 kg (235 lb 10.8 oz), SpO2 100.00%. Gen.-Opens eyes to voice Neck-not stiff, 3 lumrn catheter in right IJ Chest-rhonchi Heart-no rub is heard Abdomen-protuberant, nontender, no organs or masses are felt, bowel sounds are present GU-Foley catheter in bladder Extremities-trace edema, bandage plus drains on the left LE Neurologic: Right-handed according to wife, unable to test sensation or strength   LAB Hemoglobin  10.1, WBC 19,800, platelets 263,000 sodium 137, potassium 3.3, chloride 104, CO2 18, glucose 280, BUN 29, CR 2.9, calcium 7.3, albumin 1.4, glucose 156, phosphorus 2.8, magnesium 2.0, SGOT 58, SGPT 38, bilirubin (T.) 0.8 CR was 1.04 on admission ABG: PH 7.42, PO2 125, PCO2 28 on 40% oxygen  Renal ultrasound: Right kidney-11.7 CM, left kidney 12.5 CM, no hydronephrosis noted Chest x-ray: Left lower lobe atelectasis with bilateral airspace disease  Assessment and Plan: 1. Severe sepsis with gram-positive cocci in blood cultures. Continue pressors, antibiotics, steroids, local care to leg 2. Oliguria-begin CVVH, keep even, use 4.0 K. dialysate and replacement fluids.  HD cath to be placed soon 3. Cellulitis/fasciitis left  popliteal-per Dr. Lajoyce Corners 4. Anemia-check iron studies 5. NSTEMI -per cardiology   Shawan Corella F 06/16/2012, 2:36 PM

## 2012-06-17 ENCOUNTER — Inpatient Hospital Stay (HOSPITAL_COMMUNITY): Payer: Medicare Other

## 2012-06-17 DIAGNOSIS — J9600 Acute respiratory failure, unspecified whether with hypoxia or hypercapnia: Secondary | ICD-10-CM

## 2012-06-17 LAB — HEMOGLOBIN AND HEMATOCRIT, BLOOD
HCT: 24.9 % — ABNORMAL LOW (ref 39.0–52.0)
HCT: 23.8 % — ABNORMAL LOW (ref 39.0–52.0)
Hemoglobin: 8.6 g/dL — ABNORMAL LOW (ref 13.0–17.0)
Hemoglobin: 8.3 g/dL — ABNORMAL LOW (ref 13.0–17.0)

## 2012-06-17 LAB — CULTURE, BLOOD (ROUTINE X 2)

## 2012-06-17 LAB — POCT I-STAT 3, ART BLOOD GAS (G3+)
Bicarbonate: 22.8 meq/L (ref 20.0–24.0)
O2 Saturation: 100 %
Patient temperature: 98.6
TCO2: 24 mmol/L (ref 0–100)
pCO2 arterial: 29.6 mmHg — ABNORMAL LOW (ref 35.0–45.0)
pH, Arterial: 7.496 — ABNORMAL HIGH (ref 7.350–7.450)
pO2, Arterial: 158 mmHg — ABNORMAL HIGH (ref 80.0–100.0)

## 2012-06-17 LAB — CBC WITH DIFFERENTIAL/PLATELET
Basophils Absolute: 0 10*3/uL (ref 0.0–0.1)
Lymphocytes Relative: 6 % — ABNORMAL LOW (ref 12–46)
Lymphs Abs: 1.3 10*3/uL (ref 0.7–4.0)
MCV: 78 fL (ref 78.0–100.0)
Neutro Abs: 18.9 10*3/uL — ABNORMAL HIGH (ref 1.7–7.7)
Neutrophils Relative %: 90 % — ABNORMAL HIGH (ref 43–77)
Platelets: 253 10*3/uL (ref 150–400)
RBC: 3.27 MIL/uL — ABNORMAL LOW (ref 4.22–5.81)
WBC: 20.9 10*3/uL — ABNORMAL HIGH (ref 4.0–10.5)

## 2012-06-17 LAB — GLUCOSE, CAPILLARY
Glucose-Capillary: 130 mg/dL — ABNORMAL HIGH (ref 70–99)
Glucose-Capillary: 139 mg/dL — ABNORMAL HIGH (ref 70–99)
Glucose-Capillary: 142 mg/dL — ABNORMAL HIGH (ref 70–99)
Glucose-Capillary: 143 mg/dL — ABNORMAL HIGH (ref 70–99)
Glucose-Capillary: 145 mg/dL — ABNORMAL HIGH (ref 70–99)
Glucose-Capillary: 152 mg/dL — ABNORMAL HIGH (ref 70–99)
Glucose-Capillary: 162 mg/dL — ABNORMAL HIGH (ref 70–99)
Glucose-Capillary: 168 mg/dL — ABNORMAL HIGH (ref 70–99)
Glucose-Capillary: 184 mg/dL — ABNORMAL HIGH (ref 70–99)
Glucose-Capillary: 193 mg/dL — ABNORMAL HIGH (ref 70–99)

## 2012-06-17 LAB — RENAL FUNCTION PANEL
Albumin: 1.5 g/dL — ABNORMAL LOW (ref 3.5–5.2)
BUN: 29 mg/dL — ABNORMAL HIGH (ref 6–23)
BUN: 32 mg/dL — ABNORMAL HIGH (ref 6–23)
CO2: 19 mEq/L (ref 19–32)
Calcium: 7.5 mg/dL — ABNORMAL LOW (ref 8.4–10.5)
Creatinine, Ser: 2.69 mg/dL — ABNORMAL HIGH (ref 0.50–1.35)
Creatinine, Ser: 2.59 mg/dL — ABNORMAL HIGH (ref 0.50–1.35)
Glucose, Bld: 153 mg/dL — ABNORMAL HIGH (ref 70–99)
Glucose, Bld: 182 mg/dL — ABNORMAL HIGH (ref 70–99)
Phosphorus: 2.7 mg/dL (ref 2.3–4.6)
Phosphorus: 3.1 mg/dL (ref 2.3–4.6)
Potassium: 4.3 mEq/L (ref 3.5–5.1)

## 2012-06-17 LAB — MAGNESIUM: Magnesium: 2.2 mg/dL (ref 1.5–2.5)

## 2012-06-17 MED ORDER — SODIUM CHLORIDE 0.9 % IV SOLN
500.0000 mg | Freq: Four times a day (QID) | INTRAVENOUS | Status: DC
  Administered 2012-06-17 – 2012-06-18 (×3): 500 mg via INTRAVENOUS
  Filled 2012-06-17 (×5): qty 500

## 2012-06-17 MED ORDER — ALTEPLASE 100 MG IV SOLR
2.0000 mg | INTRAVENOUS | Status: AC
  Administered 2012-06-17: 2 mg
  Filled 2012-06-17: qty 2

## 2012-06-17 MED ORDER — HEPARIN (PORCINE) 2000 UNITS/L FOR CRRT
INTRAVENOUS_CENTRAL | Status: DC | PRN
  Filled 2012-06-17: qty 1000

## 2012-06-17 MED ORDER — HEPARIN SODIUM (PORCINE) 1000 UNIT/ML DIALYSIS
1000.0000 [IU] | INTRAMUSCULAR | Status: DC | PRN
  Administered 2012-06-17: 3000 [IU] via INTRAVENOUS_CENTRAL
  Filled 2012-06-17: qty 3

## 2012-06-17 MED ORDER — HEPARIN BOLUS VIA INFUSION (CRRT)
1000.0000 [IU] | INTRAVENOUS | Status: DC | PRN
  Administered 2012-06-18 (×3): 1000 [IU] via INTRAVENOUS_CENTRAL
  Filled 2012-06-17: qty 1000

## 2012-06-17 MED ORDER — HEPARIN SODIUM (PORCINE) 5000 UNIT/ML IJ SOLN
250.0000 [IU]/h | INTRAMUSCULAR | Status: DC
  Administered 2012-06-17 (×2): 250 [IU]/h via INTRAVENOUS_CENTRAL
  Administered 2012-06-18: 650 [IU]/h via INTRAVENOUS_CENTRAL
  Administered 2012-06-18: 1950 [IU]/h via INTRAVENOUS_CENTRAL
  Administered 2012-06-18: 1650 [IU]/h via INTRAVENOUS_CENTRAL
  Administered 2012-06-19: 2200 [IU]/h via INTRAVENOUS_CENTRAL
  Administered 2012-06-19: 2150 [IU]/h via INTRAVENOUS_CENTRAL
  Administered 2012-06-19: 2200 [IU]/h via INTRAVENOUS_CENTRAL
  Filled 2012-06-17 (×7): qty 2

## 2012-06-17 NOTE — Progress Notes (Signed)
Assessment and Plan:  1. Severe sepsis, Staph Aureus. Continue antibiotics 2. Oliguria-on CVVHD, Keep even today 3. Cellulitis/fasciitis left popliteal-s/p drainage per Dr. Lajoyce Corners  4. NSTEMI -per cardiology 5. VDRF  Subjective: Interval History:Wants foley out  Objective: Vital signs in last 24 hours: Temp:  [96.2 F (35.7 C)-99.2 F (37.3 C)] 96.2 F (35.7 C) (07/04 1000) Pulse Rate:  [83-118] 88  (07/04 1000) Resp:  [0-31] 16  (07/04 1000) BP: (98-155)/(49-75) 101/49 mmHg (07/04 0826) SpO2:  [97 %-100 %] 100 % (07/04 1000) Arterial Line BP: (76-151)/(41-79) 113/49 mmHg (07/04 1000) FiO2 (%):  [40 %] 40 % (07/04 0826) Weight:  [114 kg (251 lb 5.2 oz)] 114 kg (251 lb 5.2 oz) (07/04 0500) Weight change:   Intake/Output from previous day: 07/03 0701 - 07/04 0700 In: 4542.1 [I.V.:1867.1; NG/GT:215; IV Piggyback:2460] Out: 1064 [Urine:75] Intake/Output this shift: Total I/O In: 577.5 [I.V.:127.5; NG/GT:150; IV Piggyback:300] Out: 1207 [Urine:20; Other:1187]  Alert, oriented Lungs clear Cor RRR Abd soft Extre LLE wrapped. RLE 1+ edema  Lab Results:  Basename 06/17/12 0440 06/16/12 2315 06/16/12 1700  WBC 20.9* -- 22.2*  HGB 8.9* 9.2* --  HCT 25.5* 27.0* --  PLT 253 -- 268   BMET:  Basename 06/17/12 0440 06/16/12 1700  NA 139 140  K 4.5 3.4*  CL 105 105  CO2 19 20  GLUCOSE 153* 112*  BUN 29* 29*  CREATININE 2.69* 3.11*  CALCIUM 7.5* 6.9*   No results found for this basename: PTH:2 in the last 72 hours Iron Studies: No results found for this basename: IRON,TIBC,TRANSFERRIN,FERRITIN in the last 72 hours Studies/Results: US Renal  06/16/2012  *RADIOLOGY REPORT*  Clinical Data: Acute renal failure, diabetes, history of hypertension  RENAL/URINARY TRACT ULTRASOUND COMPLETE  Comparison:  None.  Findings:  Right Kidney:  No hydronephrosis is seen.  The right kidney measures 11.7 cm sagittally.  The echogenicity of the renal parenchyma appears to be within normal limits.   Left Kidney:  No hydronephrosis is noted.  The left kidney measures 12.5 cm.  The echogenicity of the renal parenchyma is within normal limits.  Bladder:  The urinary bladder is decompressed with a Foley catheter.  IMPRESSION: No hydronephrosis.  The urinary bladder is decompressed with Foley catheter.  Original Report Authenticated By: Juline Patch, M.D.   Dg Chest Port 1 View  06/17/2012  *RADIOLOGY REPORT*  Clinical Data: Evaluate endotracheal tube placement  PORTABLE CHEST - 1 VIEW  Comparison: Portable chest x-ray of 06/16/2012  Findings: The tip of the endotracheal tube is approximately 4.8 cm above the carina. Opacity at the left lung base remains although aeration has improved slightly.  There may be a small left pleural effusion present.  Cardiomegaly is stable.  Left central venous line and right central venous line are unchanged in position.  IMPRESSION:  1.  Tip of endotracheal tube approximately 4.3 cm above carina. 2.  Little change in left basilar opacity and cardiomegaly.  Original Report Authenticated By: Juline Patch, M.D.   Dg Chest Port 1 View  06/16/2012  *RADIOLOGY REPORT*  Clinical Data: Central line placement.  PORTABLE CHEST - 1 VIEW  Comparison: Earlier film, same date.  Findings: The endotracheal tube, right IJ catheter and NG tubes are stable.  The new left IJ catheter tip is in the SVC at the junction of the left brachiocephalic vein.  No complicating features.  Lower lung volumes with mild vascular crowding and atelectasis.  IMPRESSION: Left IJ catheter tip is in the  mid SVC at the brachiocephalic junction region.  Original Report Authenticated By: P. Loralie Champagne, M.D.   Dg Chest Port 1 View  06/16/2012  *RADIOLOGY REPORT*  Clinical Data: Check endotracheal tube  PORTABLE CHEST - 1 VIEW  Comparison: June 15, 2012  Findings: Stable tubular devices.  Bibasilar airspace disease verses atelectasis left greater than right improved.  Left apical pleural thickening likely related to  pleural fluid in a recumbent patient.  No pneumothorax.  Normal vascularity.  IMPRESSION: Improved bibasilar airspace disease verses atelectasis.  Original Report Authenticated By: Donavan Burnet, M.D.    Scheduled:   . antiseptic oral rinse  15 mL Mouth Rinse QID  . aspirin  325 mg Per Tube Daily  . chlorhexidine  15 mL Mouth Rinse BID  . feeding supplement  30 mL Per Tube 5 X Daily  . heparin subcutaneous  5,000 Units Subcutaneous Q8H  . hydrocortisone sod succinate (SOLU-CORTEF) injection  50 mg Intravenous Q6H  . imipenem-cilastatin  250 mg Intravenous Q6H  . linezolid  600 mg Intravenous Q12H  . pantoprazole (PROTONIX) IV  40 mg Intravenous QHS  . potassium chloride  10 mEq Intravenous Q1 Hr x 3  . potassium chloride  10 mEq Intravenous Q1 Hr x 5  . sodium chloride  2,000 mL Intravenous Once      LOS: 3 days   Wayne [name] C 06/17/2012,10:14 AM

## 2012-06-17 NOTE — Procedures (Signed)
Note the HD catheter placed was a double lumen, not tri lumen.  Wayne [name]

## 2012-06-17 NOTE — Progress Notes (Signed)
ANTIBIOTIC CONSULT NOTE - FOLLOW UP  Pharmacy Consult for primaxin Indication: SA sepsis  Allergies  Allergen Reactions  . Atorvastatin     REACTION: sore legs    Patient Measurements: Height: 5\' 10"  (177.8 cm) Weight: 251 lb 5.2 oz (114 kg) IBW/kg (Calculated) : 73  Adjusted Body Weight:   Vital Signs: Temp: 96.6 F (35.9 C) (07/04 1200) BP: 92/47 mmHg (07/04 1152) Pulse Rate: 90  (07/04 1200) Intake/Output from previous day: 07/03 0701 - 07/04 0700 In: 4542.1 [I.V.:1867.1; NG/GT:215; IV Piggyback:2460] Out: 1064 [Urine:75] Intake/Output from this shift: Total I/O In: 867.1 [I.V.:257.1; NG/GT:310; IV Piggyback:300] Out: 1019 [Urine:20; Other:999]  Labs:  [facility] 06/17/12 1132 06/17/12 0440 06/16/12 2315 06/16/12 1700 06/16/12 0500  WBC -- 20.9* -- 22.2* 19.8*  HGB 8.6* 8.9* 9.2* -- --  PLT -- 253 -- 268 263  LABCREA -- -- -- -- --  CREATININE -- 2.69* -- 3.11* 2.90*   Estimated Creatinine Clearance: 33.2 ml/min (by C-G formula based on Cr of 2.69). No results found for this basename: VANCOTROUGH:2,VANCOPEAK:2,VANCORANDOM:2,GENTTROUGH:2,GENTPEAK:2,GENTRANDOM:2,TOBRATROUGH:2,TOBRAPEAK:2,TOBRARND:2,AMIKACINPEAK:2,AMIKACINTROU:2,AMIKACIN:2, in the last 72 hours   Microbiology: Recent Results (from the past 720 hour(s))  CULTURE, BLOOD (ROUTINE X 2)     Status: Normal   Collection Time   06/14/12  1:20 PM      Component Value Range Status Comment   Specimen Description BLOOD ARM LEFT   Final    Special Requests BOTTLES DRAWN AEROBIC ONLY 10CC   Final    Culture  Setup Time 06/15/2012 02:15   Final    Culture     Final    Value: STAPHYLOCOCCUS AUREUS     Note: SUSCEPTIBILITIES PERFORMED ON PREVIOUS CULTURE WITHIN THE LAST 5 DAYS.     Note: Gram Stain Report Called to,Read Back By and Verified With: BURGUNDY Kross 06/15/12 1400 BY SMITHERSJ   Report Status 06/17/2012 FINAL   Final   CULTURE, BLOOD (ROUTINE X 2)     Status: Normal   Collection Time   06/14/12  1:40 PM      Component Value Range Status Comment   Specimen Description BLOOD LEFT HAND   Final    Special Requests BOTTLES DRAWN AEROBIC AND ANAEROBIC 10CC   Final    Culture  Setup Time 06/15/2012 02:15   Final    Culture     Final    Value: STAPHYLOCOCCUS AUREUS     Note: RIFAMPIN AND GENTAMICIN SHOULD NOT BE USED AS SINGLE DRUGS FOR TREATMENT OF STAPH INFECTIONS.     Note: Gram Stain Report Called to,Read Back By and Verified With: BRANDY DIN @ 1814 ON 06/15/12 BY GOLLD   Report Status 06/17/2012 FINAL   Final    Organism ID, Bacteria STAPHYLOCOCCUS AUREUS   Final   URINE CULTURE     Status: Normal   Collection Time   06/14/12  7:15 PM      Component Value Range Status Comment   Specimen Description URINE, RANDOM   Final    Special Requests NONE   Final    Culture  Setup Time 06/15/2012 01:48   Final    Colony Count NO GROWTH   Final    Culture NO GROWTH   Final    Report Status 06/16/2012 FINAL   Final   MRSA PCR SCREENING     Status: Normal   Collection Time   06/14/12  7:17 PM      Component Value Range Status Comment   MRSA by PCR NEGATIVE  NEGATIVE Final  GRAM STAIN     Status: Normal   Collection Time   06/15/12 12:39 PM      Component Value Range Status Comment   Specimen Description WOUND LEFT LOWER LEG   Final    Special Requests NONE   Final    Gram Stain     Final    Value: ABUNDANT WBC PRESENT,BOTH PMN AND MONONUCLEAR     ABUNDANT GRAM POSITIVE COCCI IN PAIRS IN CLUSTERS   Report Status 06/15/2012 FINAL   Final   ANAEROBIC CULTURE     Status: Normal (Preliminary result)   Collection Time   06/15/12 12:39 PM      Component Value Range Status Comment   Specimen Description WOUND LEFT LOWER LEG   Final    Special Requests NONE   Final    Gram Stain     Final    Value: ABUNDANT WBC PRESENT,BOTH PMN AND MONONUCLEAR     ABUNDANT GRAM POSITIVE COCCI IN PAIRS     IN CLUSTERS Performed at [facility]   Culture     Final    Value: NO ANAEROBES ISOLATED; CULTURE IN PROGRESS  FOR 5 DAYS   Report Status PENDING   Incomplete   WOUND CULTURE     Status: Normal (Preliminary result)   Collection Time   06/15/12 12:39 PM      Component Value Range Status Comment   Specimen Description WOUND LEFT LOWER LEG   Final    Special Requests NONE   Final    Gram Stain PENDING   Incomplete    Culture     Final    Value: ABUNDANT STAPHYLOCOCCUS AUREUS     Note: RIFAMPIN AND GENTAMICIN SHOULD NOT BE USED AS SINGLE DRUGS FOR TREATMENT OF STAPH INFECTIONS.   Report Status PENDING   Incomplete     Anti-infectives     Start     Dose/Rate Route Frequency Ordered Stop   06/16/12 0000   imipenem-cilastatin (PRIMAXIN) 250 mg in sodium chloride 0.9 % 100 mL IVPB        250 mg 200 mL/hr over 30 Minutes Intravenous 4 times per day 06/15/12 1900     06/15/12 2200   linezolid (ZYVOX) IVPB 600 mg        600 mg 300 mL/hr over 60 Minutes Intravenous Every 12 hours 06/15/12 1712     06/15/12 1900   clindamycin (CLEOCIN) IVPB 600 mg  Status:  Discontinued        600 mg 100 mL/hr over 30 Minutes Intravenous 3 times per day 06/15/12 1126 06/16/12 0820   06/15/12 1800   clindamycin (CLEOCIN) IVPB 600 mg  Status:  Discontinued        600 mg 100 mL/hr over 30 Minutes Intravenous 4 times per day 06/15/12 1125 06/15/12 1126   06/15/12 1800   imipenem-cilastatin (PRIMAXIN) 500 mg in sodium chloride 0.9 % 100 mL IVPB  Status:  Discontinued        500 mg 200 mL/hr over 30 Minutes Intravenous 3 times per day 06/15/12 1728 06/15/12 1900   06/15/12 1230   vancomycin (VANCOCIN) powder 1,000 mg  Status:  Discontinued        1,000 mg Other To Surgery 06/15/12 1215 06/15/12 1712   06/15/12 1222   vancomycin (VANCOCIN) powder  Status:  Discontinued          As needed 06/15/12 1223 06/15/12 1323   06/15/12 1220   gentamicin (GARAMYCIN) injection  Status:  Discontinued  As needed 06/15/12 1222 06/15/12 1323   06/15/12 1130   clindamycin (CLEOCIN) IVPB 900 mg        900 mg 100 mL/hr over  30 Minutes Intravenous STAT 06/15/12 1123 06/15/12 1235   06/15/12 0600   vancomycin (VANCOCIN) IVPB 1000 mg/200 mL premix  Status:  Discontinued        1,000 mg 200 mL/hr over 60 Minutes Intravenous Every 12 hours 06/14/12 1745 06/15/12 1712   06/15/12 0200   piperacillin-tazobactam (ZOSYN) IVPB 3.375 g  Status:  Discontinued        3.375 g 12.5 mL/hr over 240 Minutes Intravenous Every 8 hours 06/14/12 1745 06/15/12 1657   06/14/12 1615   vancomycin (VANCOCIN) IVPB 1000 mg/200 mL premix        1,000 mg 200 mL/hr over 60 Minutes Intravenous  Once 06/14/12 1608 06/14/12 1924   06/14/12 1615  piperacillin-tazobactam (ZOSYN) IVPB 3.375 g       3.375 g 100 mL/hr over 30 Minutes Intravenous  Once 06/14/12 1608 06/14/12 1854   06/14/12 1600   vancomycin (VANCOCIN) IVPB 1000 mg/200 mL premix        1,000 mg 200 mL/hr over 60 Minutes Intravenous  Once 06/14/12 1547 06/14/12 1721   06/14/12 1545   vancomycin (VANCOCIN) injection 1,000 mg  Status:  Discontinued        1,000 mg Intravenous  Once 06/14/12 1532 06/14/12 1547          Assessment: 71 yo M with SA bacteremia, cellulitis with abscess.  Likely toxic shock.  CRRT initiated 7/3.  Currently running well, no complications noted.  Cultures:  7/1 BCX x2 : MSSA  7/1 UCX:  NGF 7/2 Wound CX: MSSA    ABX:  Vanc 7/1>>7/2  Zosyn 7/1>>7/2  Clinda 7/2>>7/3  Linezolid 7/2>>>  Imipenim 7/2>>>  Goal of Therapy:  Appropriate dosing  Plan:  -Change primaxin to 500 mg IV q6h with CRRT on board - f/u renal function, LOT  Tomer Chalmers L. Illene Bolus, PharmD, BCPS Clinical Pharmacist Pager: [phone] 06/17/2012 2:23 PM

## 2012-06-17 NOTE — Anesthesia Postprocedure Evaluation (Signed)
  Anesthesia Post-op Note  Patient: Wayne [name]  Procedure(s) Performed: Procedure(s) (LRB): IRRIGATION AND DEBRIDEMENT EXTREMITY (Left)  Patient Location: PACU  Anesthesia Type: General  Level of Consciousness: awake  Airway and Oxygen Therapy: Patient Spontanous Breathing  Post-op Pain: mild  Post-op Assessment: Post-op Vital signs reviewed  Post-op Vital Signs: Reviewed  Complications: No apparent anesthesia complications

## 2012-06-17 NOTE — Procedures (Addendum)
I was present for this procedure.   Wayne [name]  

## 2012-06-17 NOTE — Progress Notes (Addendum)
Name: Wayne [name] MRN: [id number] DOB: 27-Feb-1941    LOS: 3 Date of admit 06/14/2012 12:48 PM PCP is Kristian Covey, MD Ortho is Dr Jonathon Bellows  Referring Provider:  Dr Preston Fleeting of ER Reason for Referral:  Sepsis, hypotension, acidsis , hyperglycemia  PULMONARY / CRITICAL CARE MEDICINE   71 yr old septic shock, resp failure, leg source. S/p irrigation and debridement of LE on 7/2  Events Since Admission: 7/2- refractory shock post op, acidosis, arf, epi required 7/2 irrigation and debridement of LLE 7/3- Improved pressors  Overnight: Doing very well. NAEO.   Current Status: Intubated and sedated.   Lines/Tubes: ETT 7/1>> NG Tube 7/1 >> Foley 7/1>>> RIJ 7/1>>> R Radial 7/1>>> RPIV 7/1>>> LPIV 7/1>>> Premrose Drains7/2>>>  ABX: Vanc 7/1 >>> 7/2 Zosyn 7/1 >>> 7/2 7/2 clinda>>> Linezolid 7/2>>> Imipenim 7/2>>>  Vital Signs: Temp:  [96.7 F (35.9 C)-99.9 F (37.7 C)] 96.9 F (36.1 C) (07/04 0600) Pulse Rate:  [84-118] 84  (07/04 0600) Resp:  [0-31] 28  (07/04 0600) BP: (82-155)/(38-75) 130/75 mmHg (07/03 1800) SpO2:  [100 %] 100 % (07/04 0600) Arterial Line BP: (87-151)/(54-79) 106/57 mmHg (07/03 1900) FiO2 (%):  [40 %] 40 % (07/04 0307) Weight:  [251 lb 5.2 oz (114 kg)] 251 lb 5.2 oz (114 kg) (07/04 0500)  Physical Examination: General:  Obese male. In bed. Intubated, minimal distress  Neuro:  Curerntly RASS -1 HEENT:  PEERL +, EOMI Cardiovascular:  Tachycardic, hypotensive, Normal heart sounds Lungs:  CTAB, intubated Abdomen:  Obese,  Musculoskeletal:  LLE -dressings c/d/i. LE bilat cool to touch w/ 2+ pulses.  Skin:  LLE bandages in place s/p surgery. Otherwise itnact  Principal Problem:  *Severe sepsis Active Problems:  HYPERTROPHY PROSTATE W/UR OBST & OTH LUTS  CORONARY ATHEROSCLEROSIS NATIVE CORONARY ARTERY  Polymyalgia rheumatica  Systolic CHF, chronic  OSA (obstructive sleep apnea)  Obesity  Metabolic acidosis  Cellulitis and abscess of lower  leg  Acute respiratory failure  Healthcare-associated pneumonia  Hyperglycemia  Acute encephalopathy  NSTEMI, initial episode of care   ASSESSMENT AND PLAN  PULMONARY  Lab 06/17/12 0445 06/16/12 0544 06/15/12 1737 06/15/12 1720 06/15/12 1539 06/15/12 0417  PHART 7.496* 7.416 7.260* -- 7.255* 7.359  PCO2ART 29.6* 28.3* 28.8* -- 34.5* 29.3*  PO2ART 158.0* 125.0* 125.0* -- 99.8 127.0*  HCO3 22.8 18.0* 12.9* -- 14.8* 16.2*  O2SAT 100.0 99.0 98.0 76.5 97.2 --   Ventilator Settings: Vent Mode:  [-] PRVC FiO2 (%):  [40 %] 40 % Set Rate:  [30 bmp] 30 bmp Vt Set:  [550 mL] 550 mL PEEP:  [5 cmH20] 5 cmH20 Plateau Pressure:  [15 cmH20-17 cmH20] 15 cmH20 CXR:  06/14/2012 12:48 PM  - LL consolidation  ETT: 7/1>>>  A:  Acute Resp Distress due to metabolic acidosis +/- LLL PNA,  r/o ards, not impressed overload. CXR this am overall improved airation P:   abg reviewed, reduce rate, FiO2, Expiratory phase pcxr in am, todays resolving , less likely a ARDS component Adjust vent to allow better exhalation to help BP with autopeep abg in am  Consider extubation tomorrow 7/5  CARDIOVASCULAR  Lab 06/15/12 0950 06/14/12 2350 06/14/12 1818 06/14/12 1635 06/14/12 1634  TROPONINI 7.90* 5.17* -- -- 3.49*  LATICACIDVEN -- -- -- 3.9* --  PROBNP -- -- 40981.1* -- --    Lab 06/17/12 0440 06/16/12 2315 06/16/12 1700 06/16/12 0500  WBC 20.9* -- 22.2* 19.8*  HGB 8.9* 9.2* 9.2* --  HCT 25.5* 27.0* 26.5* --  PLT  253 -- 268 263      ECG: no St changes Lines:  RIJ 7/1>>> R Radial 7/1>>> RPIV 7/1>>> LPIV 7/1>>>  A: Severe sepsis and hypotension - Pt EF of 45%. bp improved w/ multiple pressors and significant fluid boluses. Now on Levo,  and Vaso. Heparin Drip DCd from bleeding. Troponins continue to be elevated, likely from cardiac strain refractory shock requiring epi on top levo 75 on 7/2 P:  Wean off pressors , MAP goal 60 with history ef 45% edgt completed 7/2 Echo ordered and pending to  assess reg wall Asa Stress steroids continue Tx for hgb greater 8.4 with ischemia   RENAL  Lab 06/17/12 0440 06/16/12 1700 06/16/12 1300 06/16/12 0500 06/15/12 1600 06/15/12 0400 06/14/12 1817  NA 139 140 -- 137 135 138 --  K 4.5 3.4* -- -- -- -- --  CL 105 105 -- 104 105 108 --  CO2 19 20 -- 18* 14* 16* --  BUN 29* 29* -- 29* 26* 21 --  CREATININE 2.69* 3.11* -- 2.90* 2.35* 1.53* --  CALCIUM 7.5* 6.9* -- 7.3* 7.6* 8.2* --  MG 2.2 -- 2.0 1.6 -- 1.4* 1.4*  PHOS 2.7 -- -- -- -- 2.8 2.2*   Intake/Output      07/03 0701 - 07/04 0700 07/04 0701 - 07/05 0700   I.V. (mL/kg) 1867.1 (16.4)    Blood     NG/GT 215    IV Piggyback 2460    Total Intake(mL/kg) 4542.1 (39.8)    Urine (mL/kg/hr) 75 (0)    Emesis/NG output     Other 989    Total Output 1064    Net +3478.1          Foley: not placed at admit 06/14/2012 12:48 PM   A:  Severe metabolic acidosis, suspect due to sepsis lactic acid. Cr bump likely from AKI resulting in ATN from hypoperfusion from severe sepsis. On CVVHD today. Oliguric w/ 75 cc in past 24hrs., hypokalemia resolved. Mixed small AG  / non ag, improved since admission. Renal US unremarkable. P:   Continue CVVHD Monitor i/o bmet in am  GASTROINTESTINAL  Lab 06/17/12 0440 06/16/12 1700 06/16/12 0500 06/14/12 1817 06/14/12 1307  AST -- 35 58* 43* 36  ALT -- 30 38 23 27  ALKPHOS -- 86 67 97 83  BILITOT -- 0.5 0.8 0.3 0.5  PROT -- 4.7* 4.7* 5.5* 6.9  ALBUMIN 1.4* 1.4* 1.4* 1.6* 2.1*    A:  Tolerating TF P:   ppi  HEMATOLOGIC  Lab 06/17/12 0440 06/16/12 2315 06/16/12 1700 06/16/12 1100 06/16/12 0500 06/15/12 1720 06/15/12 1207 06/14/12 1817  HGB 8.9* 9.2* 9.2* 10.1* 9.9* -- -- --  HCT 25.5* 27.0* 26.5* 29.5* 29.1* -- -- --  PLT 253 -- 268 -- 263 391 374 --  INR -- -- -- -- -- -- -- 1.55*  APTT -- -- -- -- -- -- -- 38*   A:  Anemia of chrnic and critical illness. Received 2 units PRBC.  P:  Transfusion threshold 8.4 for ischemia - cbc in am -sub q  heparin   INFECTIOUS  Lab 06/17/12 0440 06/16/12 1700 06/16/12 0500 06/15/12 1720 06/15/12 1207 06/14/12 1634  WBC 20.9* 22.2* 19.8* 31.0* 24.9* --  PROCALCITON -- -- -- -- -- 7.60   Cultures: 7/1 BCX: >>> SA 7/1 BCX:>>> SA 7/1 UCX >>>  7/2 Wound CX: >>> SA  ABX:  Vanc 7/1 >>> 7/2 Zosyn 7/1 >>> 7/2 7/2 clinda>>> 7/3 Linezolid 7/2>>> Imipenim  7/2>>>  A:  Sepsis w/ SA. Complete sensistivities pending. Cellulitis LLE w/ Abscess, s/p irriagation and debridement POD2. Now afebrile and normal HR. Likely toxic shock appearance from staph P:   Cont. Linezolid, imipenem (Linezolid inhibits toxin production) Follow final of wound and blood sensitivities  ENDOCRINE  Lab 06/17/12 0028 06/16/12 2206 06/16/12 2045 06/16/12 2005 06/16/12 1906  GLUCAP 168* 139* 122* 136* 147*   A:  Hyperglycemia likely reaction to sepsis and not DKA . Known diabetic on oral agents. Cortisol borderline P:   No oral agents in hospital ICU hyperglycemia protocol Cortisol just borderline under the stress he was under, maintain stress steroids (50mg  IV Q6h)  NEUROLOGIC  A:  Confused at admit. Normalized with fluids and bp increase. Fentanyl 127mcg/hr  P:   WUA Cont fent  GLOBAL  - He said he would rather die rather than amputation  BEST PRACTICE / DISPOSITION Level of Care:  icu Primary Service:  pccm Consultants:  Ortho Dr Lajoyce Corners  Code Status:  Full  Diet:  TF 7/3 DVT Px:  Sq heparin GI Px:  ppi Skin Integrity:  Intact other than LLE Social / Family: son updated   Shelly Flatten, MD Family Medicine PGY-2   CC  I have seen and examined this patient with the PGY2 Resident  and agree with the above assessment and plan.    Shan Levans Beeper  [phone]  Cell  [phone]  If no response or cell goes to voicemail, call beeper [phone]   06/17/2012, 7:14 AM

## 2012-06-17 NOTE — Progress Notes (Signed)
Primary cardiologist: Dr.McLean  Patient Name: Wayne [name] Date of Encounter: 06/17/2012  Principal Problem:  *Severe sepsis Active Problems:  HYPERTROPHY PROSTATE W/UR OBST & OTH LUTS  CORONARY ATHEROSCLEROSIS NATIVE CORONARY ARTERY  Polymyalgia rheumatica  Systolic CHF, chronic  OSA (obstructive sleep apnea)  Obesity  Metabolic acidosis  Cellulitis and abscess of lower leg  Acute respiratory failure  Healthcare-associated pneumonia  Hyperglycemia  Acute encephalopathy  NSTEMI, initial episode of care    SUBJECTIVE: A&O on vent. SOB improving, no chest pain.  OBJECTIVE Filed Vitals:   06/17/12 0745 06/17/12 0800 06/17/12 0815 06/17/12 0826  BP:    101/49  Pulse: 91 86 84 91  Temp: 96.7 F (35.9 C) 96.7 F (35.9 C) 96.7 F (35.9 C)   TempSrc:      Resp: 19 13 15 23   Height:      Weight:      SpO2: 97% 99% 100% 100%    Intake/Output Summary (Last 24 hours) at 06/17/12 0844 Last data filed at 06/17/12 0800  Gross per 24 hour  Intake 3137.2 ml  Output   1079 ml  Net 2058.2 ml   Weight change:  Filed Weights   06/14/12 1700 06/15/12 0433 06/17/12 0500  Weight: 226 lb 6.6 oz (102.7 kg) 235 lb 10.8 oz (106.9 kg) 251 lb 5.2 oz (114 kg)     PHYSICAL EXAM General: Well developed, well nourished, male in no acute distress. Head: Normocephalic, atraumatic.  Neck: Supple without bruits, JVD diff to assess. Lungs:  Resp regular and unlabored, decreased basilar breath sounds with rales, ?crackles. Heart: RRR, S1, S2, no S3, S4, or murmur. Abdomen: Soft, non-tender, non-distended, BS + x 4.  Extremities: No clubbing, cyanosis, some edema in upper extrem and ?in abd. LE trace.  Neuro: Alert and oriented X 3. Moves all extremities spontaneously. Psych: Normal affect.  LABS: CBC: Basename 06/17/12 0440 06/16/12 2315 06/16/12 1700 06/15/12 1207  WBC 20.9* -- 22.2* --  NEUTROABS 18.9* -- -- 21.6*  HGB 8.9* 9.2* -- --  HCT 25.5* 27.0* -- --  MCV 78.0 -- 77.7*  --  PLT 253 -- 268 --   INR: Basename 06/14/12 1817  INR 1.55*   Basic Metabolic Panel: Basename 06/17/12 0440 06/16/12 1700 06/16/12 1300 06/15/12 0400  NA 139 140 -- --  K 4.5 3.4* -- --  CL 105 105 -- --  CO2 19 20 -- --  GLUCOSE 153* 112* -- --  BUN 29* 29* -- --  CREATININE 2.69* 3.11* -- --  CALCIUM 7.5* 6.9* -- --  MG 2.2 -- 2.0 --  PHOS 2.7 -- -- 2.8   Liver Function Tests: Basename 06/17/12 0440 06/16/12 1700 06/16/12 0500  AST -- 35 58*  ALT -- 30 38  ALKPHOS -- 86 67  BILITOT -- 0.5 0.8  PROT -- 4.7* 4.7*  ALBUMIN 1.4* 1.4* --   Cardiac Enzymes: Basename 06/15/12 0950 06/14/12 2350 06/14/12 1634  CKTOTAL 242* 239* 255*  CKMB 8.1* 10.1* 11.5*  CKMBINDEX -- -- --  TROPONINI 7.90* 5.17* 3.49*   BNP: Pro B Natriuretic peptide (BNP)  Date/Time Value Range Status  06/14/2012  6:18 PM 24481.0* 0 - 125 pg/mL Final  03/22/2012  9:12 AM 41.0  0.0 - 100.0 pg/mL Final   TELE: SR, occ. PVCs, pairs and 1 salvo  Radiology/Studies: Dg Chest Port 1 View 06/17/2012  *RADIOLOGY REPORT*  Clinical Data: Evaluate endotracheal tube placement  PORTABLE CHEST - 1 VIEW  Comparison: Portable chest x-ray of  06/16/2012  Findings: The tip of the endotracheal tube is approximately 4.8 cm above the carina. Opacity at the left lung base remains although aeration has improved slightly.  There may be a small left pleural effusion present.  Cardiomegaly is stable.  Left central venous line and right central venous line are unchanged in position.  IMPRESSION:  1.  Tip of endotracheal tube approximately 4.3 cm above carina. 2.  Little change in left basilar opacity and cardiomegaly.  Original Report Authenticated By: Juline Patch, M.D.   Current Medications:     . antiseptic oral rinse  15 mL Mouth Rinse QID  . aspirin  325 mg Per Tube Daily  . chlorhexidine  15 mL Mouth Rinse BID  . feeding supplement  30 mL Per Tube 5 X Daily  . heparin subcutaneous  5,000 Units Subcutaneous Q8H  .  hydrocortisone sod succinate (SOLU-CORTEF) injection  50 mg Intravenous Q6H  . imipenem-cilastatin  250 mg Intravenous Q6H  . linezolid  600 mg Intravenous Q12H  . magnesium sulfate 1 - 4 g bolus IVPB  2 g Intravenous Once  . pantoprazole (PROTONIX) IV  40 mg Intravenous QHS  . potassium chloride  10 mEq Intravenous Q1 Hr x 3  . potassium chloride  10 mEq Intravenous Q1 Hr x 5  . sodium chloride  1,000 mL Intravenous Once  . sodium chloride  2,000 mL Intravenous Once      . dextrose    . feeding supplement (GLUCERNA 1.2 CAL) 1,000 mL (06/17/12 0200)  . fentaNYL infusion INTRAVENOUS 100 mcg/hr (06/17/12 0149)  . insulin (NOVOLIN-R) infusion 4.9 mL/hr at 06/17/12 0700  . norepinephrine (LEVOPHED) Adult infusion 12 mcg/min (06/17/12 0800)  . dialysis replacement fluid (prismasate) 500 mL/hr at 06/16/12 1725  . dialysis replacement fluid (prismasate) 500 mL/hr at 06/17/12 0346  . dialysate (PRISMASATE) 1,000 mL/hr at 06/16/12 2200  . DISCONTD: feeding supplement (OSMOLITE 1.2 CAL) 20 mL (06/16/12 1117)  . DISCONTD: vasopressin (PITRESSIN) infusion - *FOR SHOCK* Stopped (06/17/12 0800)    ASSESSMENT AND PLAN:  NSTEMI, initial episode of care: per DB note yesterday,  NSTEMI likely due to demand ischemia. This is asymptomatic. Not candidate for cath due to renal failure and inability to take DAPT. Continue medical management/supportive care. Can restart ASA and b-blocker as tolerated. Can consider outpatient Myoview at some point to evaluate burden of residual ischemia. No new recommendations.  Otherwise, per CCM and Neprhology. Principal Problem:  *Severe sepsis Active Problems:  HYPERTROPHY PROSTATE W/UR OBST & OTH LUTS  CORONARY ATHEROSCLEROSIS NATIVE CORONARY ARTERY  Polymyalgia rheumatica  Systolic CHF, chronic  OSA (obstructive sleep apnea)  Obesity  Metabolic acidosis  Cellulitis and abscess of lower leg  Acute respiratory failure  Healthcare-associated pneumonia   Hyperglycemia  Acute encephalopathy  Signed, Theodore Demark , PA-C 8:44 AM 06/17/2012   Attending note:  Patient seen and examined. Supportive care continues via CCM. No obvious chest pain. Recent BP 112/49, HR 80-90. Lungs with scattered rhonchi, no wheeze, cardiac with RRR, no S3.  Creatinine 2.6, Hbg 8.9, platelets 253, echocardiogram shows LVEF 15% with diffuse hypokinesis, mild MR, small pericardial effusion.  On ASA, [place] Heparin, steroids, antibiotics, PPI. Can plan to broaden medical therapy for progressive cardiomyopathy as becomes more stable. 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  Jonelle Sidle, M.D., F.A.C.C.

## 2012-06-17 NOTE — Procedures (Signed)
Exchange of Hemodialysis Catheter Procedure Note ELIAZAR OLIVAR [id number] 06/07/1941  Procedure: Insertion of Hemodialysis Catheter Indications: Assessment of intravascular volume, Drug and/or fluid administration and Frequent blood sampling  Procedure Details Consent: Risks of procedure as well as the alternatives and risks of each were explained to the (patient/caregiver).  Consent for procedure obtained. Time Out: Verified patient identification, verified procedure, site/side was marked, verified correct patient position, special equipment/implants available, medications/allergies/relevent history reviewed, required imaging and test results available.  Performed  Maximum sterile technique was used including antiseptics, cap, gloves, gown, hand hygiene, mask and sheet. Skin prep: Chlorhexidine; local anesthetic administered A  triple lumen HD catheter was exchanged over wire -placed in the left internal jugular vein using the Seldinger technique.  Evaluation Blood flow good Complications: No apparent complications Patient did tolerate procedure well. Chest X-ray ordered to verify placement.  CXR: pending.  Canary Brim, NP-C [facility] Pgr: [phone] or [phone]

## 2012-06-18 ENCOUNTER — Other Ambulatory Visit (HOSPITAL_COMMUNITY): Payer: Medicare Other

## 2012-06-18 ENCOUNTER — Inpatient Hospital Stay (HOSPITAL_COMMUNITY): Payer: Medicare Other

## 2012-06-18 DIAGNOSIS — A419 Sepsis, unspecified organism: Secondary | ICD-10-CM

## 2012-06-18 DIAGNOSIS — A4101 Sepsis due to Methicillin susceptible Staphylococcus aureus: Secondary | ICD-10-CM | POA: Diagnosis present

## 2012-06-18 LAB — BLOOD GAS, ARTERIAL
Bicarbonate: 23.9 mEq/L (ref 20.0–24.0)
MECHVT: 550 mL
O2 Saturation: 99.6 %
PEEP: 5 cmH2O
Patient temperature: 96.5
RATE: 12 resp/min
pH, Arterial: 7.475 — ABNORMAL HIGH (ref 7.350–7.450)

## 2012-06-18 LAB — GLUCOSE, CAPILLARY
Glucose-Capillary: 129 mg/dL — ABNORMAL HIGH (ref 70–99)
Glucose-Capillary: 135 mg/dL — ABNORMAL HIGH (ref 70–99)
Glucose-Capillary: 144 mg/dL — ABNORMAL HIGH (ref 70–99)
Glucose-Capillary: 159 mg/dL — ABNORMAL HIGH (ref 70–99)
Glucose-Capillary: 160 mg/dL — ABNORMAL HIGH (ref 70–99)
Glucose-Capillary: 162 mg/dL — ABNORMAL HIGH (ref 70–99)
Glucose-Capillary: 164 mg/dL — ABNORMAL HIGH (ref 70–99)
Glucose-Capillary: 165 mg/dL — ABNORMAL HIGH (ref 70–99)
Glucose-Capillary: 176 mg/dL — ABNORMAL HIGH (ref 70–99)
Glucose-Capillary: 143 mg/dL — ABNORMAL HIGH (ref 70–99)
Glucose-Capillary: 218 mg/dL — ABNORMAL HIGH (ref 70–99)

## 2012-06-18 LAB — RENAL FUNCTION PANEL
Albumin: 1.4 g/dL — ABNORMAL LOW (ref 3.5–5.2)
BUN: 31 mg/dL — ABNORMAL HIGH (ref 6–23)
BUN: 29 mg/dL — ABNORMAL HIGH (ref 6–23)
CO2: 23 mEq/L (ref 19–32)
Calcium: 7.6 mg/dL — ABNORMAL LOW (ref 8.4–10.5)
Calcium: 7.4 mg/dL — ABNORMAL LOW (ref 8.4–10.5)
Creatinine, Ser: 1.86 mg/dL — ABNORMAL HIGH (ref 0.50–1.35)
GFR calc Af Amer: 35 mL/min — ABNORMAL LOW (ref >90)
Glucose, Bld: 163 mg/dL — ABNORMAL HIGH (ref 70–99)
Glucose, Bld: 240 mg/dL — ABNORMAL HIGH (ref 70–99)
Phosphorus: 2.3 mg/dL (ref 2.3–4.6)
Potassium: 4.1 mEq/L (ref 3.5–5.1)
Potassium: 3.9 mEq/L (ref 3.5–5.1)
Sodium: 136 mEq/L (ref 135–145)

## 2012-06-18 LAB — POCT ACTIVATED CLOTTING TIME
Activated Clotting Time: 139 seconds
Activated Clotting Time: 154 seconds
Activated Clotting Time: 155 seconds
Activated Clotting Time: 134 seconds
Activated Clotting Time: 154 seconds
Activated Clotting Time: 164 seconds
Activated Clotting Time: 164 seconds
Activated Clotting Time: 164 seconds
Activated Clotting Time: 164 seconds
Activated Clotting Time: 164 seconds

## 2012-06-18 LAB — CBC
HCT: 23.1 % — ABNORMAL LOW (ref 39.0–52.0)
Hemoglobin: 7.6 g/dL — ABNORMAL LOW (ref 13.0–17.0)
MCH: 26.2 pg (ref 26.0–34.0)
MCV: 79.7 fL (ref 78.0–100.0)
RBC: 2.9 MIL/uL — ABNORMAL LOW (ref 4.22–5.81)
WBC: 16.7 10*3/uL — ABNORMAL HIGH (ref 4.0–10.5)

## 2012-06-18 LAB — HEMOGLOBIN AND HEMATOCRIT, BLOOD
HCT: 21 % — ABNORMAL LOW (ref 39.0–52.0)
Hemoglobin: 8 g/dL — ABNORMAL LOW (ref 13.0–17.0)
Hemoglobin: 7.4 g/dL — ABNORMAL LOW (ref 13.0–17.0)
Hemoglobin: 7 g/dL — ABNORMAL LOW (ref 13.0–17.0)

## 2012-06-18 MED ORDER — GLUCERNA SHAKE PO LIQD
237.0000 mL | Freq: Three times a day (TID) | ORAL | Status: DC
  Administered 2012-06-19 – 2012-06-21 (×4): 237 mL via ORAL
  Filled 2012-06-18: qty 237

## 2012-06-18 MED ORDER — INSULIN ASPART 100 UNIT/ML ~~LOC~~ SOLN: 0.0000 [IU] | Freq: Every day | SUBCUTANEOUS | Status: DC

## 2012-06-18 MED ORDER — FENTANYL CITRATE 0.05 MG/ML IJ SOLN
25.0000 ug | INTRAMUSCULAR | Status: DC | PRN
  Administered 2012-06-18: 50 ug via INTRAVENOUS
  Administered 2012-06-18: 25 ug via INTRAVENOUS
  Administered 2012-06-18 – 2012-06-19 (×2): 50 ug via INTRAVENOUS
  Administered 2012-06-19: 25 ug via INTRAVENOUS
  Administered 2012-06-19: 50 ug via INTRAVENOUS
  Administered 2012-06-19: 25 ug via INTRAVENOUS
  Administered 2012-06-19: 50 ug via INTRAVENOUS
  Administered 2012-06-19: 25 ug via INTRAVENOUS
  Administered 2012-06-21 – 2012-06-22 (×3): 50 ug via INTRAVENOUS
  Administered 2012-06-22 – 2012-06-23 (×2): 25 ug via INTRAVENOUS
  Administered 2012-06-24 – 2012-07-02 (×15): 50 ug via INTRAVENOUS
  Filled 2012-06-18 (×31): qty 2

## 2012-06-18 MED ORDER — INSULIN ASPART 100 UNIT/ML ~~LOC~~ SOLN
0.0000 [IU] | Freq: Three times a day (TID) | SUBCUTANEOUS | Status: DC
  Administered 2012-06-18: 2 [IU] via SUBCUTANEOUS
  Administered 2012-06-18: 5 [IU] via SUBCUTANEOUS

## 2012-06-18 MED ORDER — FENTANYL BOLUS VIA INFUSION
25.0000 ug | Freq: Four times a day (QID) | INTRAVENOUS | Status: DC | PRN
  Filled 2012-06-18: qty 50

## 2012-06-18 MED ORDER — INSULIN ASPART 100 UNIT/ML ~~LOC~~ SOLN
0.0000 [IU] | Freq: Three times a day (TID) | SUBCUTANEOUS | Status: DC
  Administered 2012-06-19: 7 [IU] via SUBCUTANEOUS
  Administered 2012-06-19: 11 [IU] via SUBCUTANEOUS
  Administered 2012-06-20 – 2012-06-21 (×3): 4 [IU] via SUBCUTANEOUS
  Administered 2012-06-22 – 2012-06-24 (×4): 3 [IU] via SUBCUTANEOUS
  Administered 2012-06-24: 15 [IU] via SUBCUTANEOUS
  Administered 2012-06-25: 20 [IU] via SUBCUTANEOUS

## 2012-06-18 MED ORDER — CEFAZOLIN SODIUM-DEXTROSE 2-3 GM-% IV SOLR
2.0000 g | Freq: Two times a day (BID) | INTRAVENOUS | Status: DC
  Administered 2012-06-18 – 2012-06-21 (×7): 2 g via INTRAVENOUS
  Filled 2012-06-18 (×8): qty 50

## 2012-06-18 MED ORDER — INSULIN GLARGINE 100 UNIT/ML ~~LOC~~ SOLN
10.0000 [IU] | Freq: Every day | SUBCUTANEOUS | Status: DC
  Administered 2012-06-18 – 2012-06-23 (×6): 10 [IU] via SUBCUTANEOUS

## 2012-06-18 MED ORDER — PANTOPRAZOLE SODIUM 40 MG PO PACK
40.0000 mg | PACK | Freq: Every day | ORAL | Status: DC
  Filled 2012-06-18: qty 20

## 2012-06-18 MED ORDER — FENTANYL CITRATE 0.05 MG/ML IJ SOLN
INTRAMUSCULAR | Status: AC
  Administered 2012-06-18: 50 ug via INTRAVENOUS
  Filled 2012-06-18: qty 2

## 2012-06-18 MED ORDER — INSULIN ASPART 100 UNIT/ML ~~LOC~~ SOLN
0.0000 [IU] | Freq: Every day | SUBCUTANEOUS | Status: DC
  Administered 2012-06-18: 2 [IU] via SUBCUTANEOUS
  Administered 2012-06-24: 5 [IU] via SUBCUTANEOUS

## 2012-06-18 MED ORDER — FENTANYL 25 MCG/HR TD PT72
25.0000 ug | MEDICATED_PATCH | TRANSDERMAL | Status: DC
  Administered 2012-06-18: 25 ug via TRANSDERMAL
  Filled 2012-06-18: qty 1

## 2012-06-18 MED ORDER — INSULIN ASPART 100 UNIT/ML ~~LOC~~ SOLN
6.0000 [IU] | Freq: Three times a day (TID) | SUBCUTANEOUS | Status: DC
  Administered 2012-06-19 – 2012-06-23 (×11): 6 [IU] via SUBCUTANEOUS

## 2012-06-18 MED ORDER — PANTOPRAZOLE SODIUM 40 MG PO TBEC
40.0000 mg | DELAYED_RELEASE_TABLET | Freq: Every day | ORAL | Status: DC
  Administered 2012-06-18 – 2012-06-23 (×6): 40 mg via ORAL
  Filled 2012-06-18 (×6): qty 1

## 2012-06-18 NOTE — Procedures (Signed)
Extubation Procedure Note  Patient Details:   Name: Wayne [name] DOB: [birth year] MRN: [id number]   Airway Documentation:     Evaluation  O2 sats: stable throughout Complications: No apparent complications Patient did tolerate procedure well. Bilateral Breath Sounds:  (somewhat coarse BBS; diminished bases) Suctioning: Airway Yes Pt extubated per MD.  Pt able to speak name.  No complications noted.  SPO2 99% on 2lpm.   [name] 06/18/2012, 9:52 AM

## 2012-06-18 NOTE — Progress Notes (Signed)
Patient ID: Wayne [name], male   DOB: 1941-07-13, 71 y.o.   MRN: [id number] Dressing changed left leg, drains removed, edges well approximated, mild ischemic skin changes and some fracture blisters.  Will have nursing change dressing daily and PRN.

## 2012-06-18 NOTE — Progress Notes (Signed)
Patient Name: Wayne [name] Date of Encounter: 06/18/2012  Principal Problem:  *Severe sepsis Active Problems:  HYPERTROPHY PROSTATE W/UR OBST & OTH LUTS  CORONARY ATHEROSCLEROSIS NATIVE CORONARY ARTERY  Polymyalgia rheumatica  Systolic CHF, chronic  OSA (obstructive sleep apnea)  Obesity  Metabolic acidosis  Cellulitis and abscess of lower leg  Acute respiratory failure  Healthcare-associated pneumonia  Hyperglycemia  Acute encephalopathy  NSTEMI, initial episode of care    SUBJECTIVE: No chest pain, SOB improving. Feels generally better.  OBJECTIVE Filed Vitals:   06/18/12 1100 06/18/12 1115 06/18/12 1130 06/18/12 1145  BP: 93/48 102/51 90/31 92/57   Pulse: 95 103 98 94  Temp: 97.2 F (36.2 C) 97.1 F (36.2 C) 97.1 F (36.2 C) 97.2 F (36.2 C)  TempSrc:      Resp: 18 20 20 14   Height:      Weight:      SpO2: 100% 100% 100% 100%    Intake/Output Summary (Last 24 hours) at 06/18/12 1203 Last data filed at 06/18/12 1130  Gross per 24 hour  Intake 2538.89 ml  Output   1968 ml  Net 570.89 ml   Weight change: -2 lb 6.8 oz (-1.1 kg) Filed Weights   06/15/12 0433 06/17/12 0500 06/18/12 0500  Weight: 235 lb 10.8 oz (106.9 kg) 251 lb 5.2 oz (114 kg) 248 lb 14.4 oz (112.9 kg)    PHYSICAL EXAM General: Well developed, well nourished, in no acute distress. Head: Normocephalic, atraumatic.  Neck: Supple without bruits, JVD. Lungs:  Resp regular and unlabored, CTA. Heart: RRR, S1, S2, no S3, S4, or murmur. Abdomen: Soft, non-tender, non-distended, BS + x 4.  Extremities: No clubbing, cyanosis, edema.  Neuro: Alert and oriented X 3. Moves all extremities spontaneously. Psych: Normal affect.  LABS: CBC: Basename 06/18/12 1100 06/18/12 0500 06/17/12 0440 06/15/12 1207  WBC -- 16.7* 20.9* --  NEUTROABS -- -- 18.9* 21.6*  HGB 7.4* 7.6* -- --  HCT 21.9* 23.1* -- --  MCV -- 79.7 78.0 --  PLT -- 225 253 --   INR:No results found for this basename: INR in the last  72 hours Basic Metabolic Panel: Basename 06/18/12 0500 06/17/12 1721 06/17/12 0440  NA 136 135 --  K 4.1 4.3 --  CL 103 103 --  CO2 23 22 --  GLUCOSE 163* 182* --  BUN 31* 32* --  CREATININE 2.15* 2.59* --  CALCIUM 7.6* 7.7* --  MG 2.2 -- 2.2  PHOS 3.0 3.1 --   Liver Function Tests: Basename 06/18/12 0500 06/17/12 1721 06/16/12 1700 06/16/12 0500  AST -- -- 35 58*  ALT -- -- 30 38  ALKPHOS -- -- 86 67  BILITOT -- -- 0.5 0.8  PROT -- -- 4.7* 4.7*  ALBUMIN 1.4* 1.5* -- --   Cardiac Enzymes: Lab Results  Component Value Date   CKTOTAL 242* 06/15/2012   CKMB 8.1* 06/15/2012   TROPONINI 7.90* 06/15/2012    BNP: Pro B Natriuretic peptide (BNP)  Date/Time Value Range Status  06/14/2012  6:18 PM 24481.0* 0 - 125 pg/mL Final  03/22/2012  9:12 AM 41.0  0.0 - 100.0 pg/mL Final   2D Echo: 06/16/2012 Study Conclusions - Left ventricle: The cavity size was mildly dilated. Wall thickness was normal. The estimated ejection fraction was 15%. Diffuse hypokinesis. Doppler parameters are consistent with abnormal left ventricular relaxation (grade 1 diastolic dysfunction). - Mitral valve: Mild regurgitation. - Left atrium: The atrium was moderately dilated. - Pericardium, extracardiac: A small pericardial effusion was  identified. Impressions: - Small pericardial effusion with mild RV diastolic collapse.   TELE:  SR/ST with rare PVCs   Radiology/Studies: Dg Chest Port 1 View 06/18/2012  *RADIOLOGY REPORT*  Clinical Data: Ventilator support.  Hypoxia.  PORTABLE CHEST - 1 VIEW  Comparison: 06/17/2012  Findings: Endotracheal tube has its tip 2.5 cm above the carina. Nasogastric tube enters the stomach.  Left internal jugular catheter has its tip in the SVC at the azygos level.  Right internal jugular central line has its tip in the SVC at the azygos level.  No pneumothorax.  There is volume loss in the lower lobes left worse than right.  IMPRESSION: Lines and tubes well positioned.  Persistent lower  lobe volume loss left worse than right.  Original Report Authenticated By: Thomasenia Sales, M.D.    Current Medications:  . alteplase  2 mg Intracatheter STAT  . antiseptic oral rinse  15 mL Mouth Rinse QID  . aspirin  325 mg Per Tube Daily  .  ceFAZolin (ANCEF) IV  2 g Intravenous Q12H  . chlorhexidine  15 mL Mouth Rinse BID  . feeding supplement  30 mL Per Tube 5 X Daily  . fentaNYL  25 mcg Transdermal Q72H  . heparin subcutaneous  5,000 Units Subcutaneous Q8H  . hydrocortisone sod succinate (SOLU-CORTEF) injection  50 mg Intravenous Q6H  . insulin aspart  0-15 Units Subcutaneous TID WC  . insulin aspart  0-5 Units Subcutaneous QHS  . insulin glargine  10 Units Subcutaneous Daily  . pantoprazole  40 mg Oral Q1200  . sodium chloride  2,000 mL Intravenous Once      . dextrose    . feeding supplement (GLUCERNA 1.2 CAL) 1,000 mL (06/17/12 0200)  . heparin 10,000 units/ 20 mL infusion syringe 1,550 Units/hr (06/18/12 1100)  . norepinephrine (LEVOPHED) Adult infusion 3 mcg/min (06/18/12 1100)  . dialysis replacement fluid (prismasate) 500 mL/hr at 06/18/12 0626  . dialysis replacement fluid (prismasate) 500 mL/hr at 06/17/12 1818  . dialysate (PRISMASATE) 1,000 mL/hr at 06/18/12 0625  . DISCONTD: fentaNYL infusion INTRAVENOUS Stopped (06/18/12 0920)  . DISCONTD: insulin (NOVOLIN-R) infusion 4.1 mL/hr at 06/18/12 0800    ASSESSMENT AND PLAN: NSTEMI, initial episode of care: per DB note 7/3 ,  NSTEMI likely due to demand ischemia. This is asymptomatic. Not candidate for cath due to renal failure and inability to take DAPT. Continue medical management/supportive care. Can restart ASA and b-blocker as tolerated. Can consider outpatient Myoview at some point to evaluate burden of residual ischemia. No new recommendations.  Left Ventricular Dysfunction: Pt EF 2/13 was 45% at echo, now back down to 15%. Still on pressors, volume management with HD, no ACE/BB secondary to renal insufficiency  and low BP.    Otherwise, per CCM/Neprhology -  Principal Problem:  *Severe sepsis Active Problems:  HYPERTROPHY PROSTATE W/UR OBST & OTH LUTS  CORONARY ATHEROSCLEROSIS NATIVE CORONARY ARTERY  Polymyalgia rheumatica  Systolic CHF, chronic  OSA (obstructive sleep apnea)  Obesity  Metabolic acidosis  Cellulitis and abscess of lower leg  Acute respiratory failure  Healthcare-associated pneumonia  Hyperglycemia  Acute encephalopathy  Signed, Theodore Demark , PA-C 12:03 PM 06/18/2012  Patient seen with PA, agree with note.  Hope this is septic cardiomyopathy and will recover.  Demand ischemia/sepsis could explain TnI elevation but will need at least myoview when more stable.  For now, still on low dose norepinephrine and CVVH.  Has had to have transfusions in setting of hematoma.  We are  very limited in the cardiac meds that can be used.   Marca Ancona 06/18/2012 2:15 PM

## 2012-06-18 NOTE — Progress Notes (Signed)
Patient ID: Wayne [name], male   DOB: 03-08-1941, 71 y.o.   MRN: [id number]    [facility] for Infectious Disease  Total days of antibiotics 5        Day 1 cefazolin               Reason for Consult: MSSA bacteremia with sepsis due to a left popliteal fossa and calf abscess  Referring Physician: Dr. Micah Flesher  Principal Problem:  *Staphylococcus aureus bacteremia with sepsis Active Problems:  Cellulitis and abscess of lower leg  HYPERTROPHY PROSTATE W/UR OBST & OTH LUTS  CORONARY ATHEROSCLEROSIS NATIVE CORONARY ARTERY  Polymyalgia rheumatica  Systolic CHF, chronic  OSA (obstructive sleep apnea)  Obesity  Metabolic acidosis  Acute respiratory failure  Healthcare-associated pneumonia  Hyperglycemia  Acute encephalopathy  NSTEMI, initial episode of care      . antiseptic oral rinse  15 mL Mouth Rinse QID  . aspirin  325 mg Per Tube Daily  .  ceFAZolin (ANCEF) IV  2 g Intravenous Q12H  . chlorhexidine  15 mL Mouth Rinse BID  . feeding supplement  30 mL Per Tube 5 X Daily  . fentaNYL  25 mcg Transdermal Q72H  . heparin subcutaneous  5,000 Units Subcutaneous Q8H  . hydrocortisone sod succinate (SOLU-CORTEF) injection  50 mg Intravenous Q6H  . insulin aspart  0-15 Units Subcutaneous TID WC  . insulin aspart  0-5 Units Subcutaneous QHS  . insulin glargine  10 Units Subcutaneous Daily  . pantoprazole  40 mg Oral Q1200  . sodium chloride  2,000 mL Intravenous Once  . DISCONTD: imipenem-cilastatin  500 mg Intravenous Q6H  . DISCONTD: linezolid  600 mg Intravenous Q12H  . DISCONTD: pantoprazole (PROTONIX) IV  40 mg Intravenous QHS  . DISCONTD: pantoprazole sodium  40 mg Per Tube Q1200    Recommendations: 1. Continue cefazolin  2. Repeat blood cultures  Assessment: Mr. Azucena Kuba developed severe sepsis due to a methicillin sensitive staph aureus abscess of his left leg complicated by bacteremia. There are no prospective trials to guide duration of antibiotic therapy but  current expert opinion suggest that most patients need 4-6 weeks of IV antibiotics when bacteremia arises from a deep focus of infection. I would continue IV cefazolin and repeat blood cultures now. I do not feel strongly that he needs a transesophageal echocardiogram. His recent transthoracic echocardiogram did not show any evidence of endocarditis and I do not see any new conduction abnormalities to suggest endocardial abscess. I doubt that the results of the transesophageal study we'll alter his management her outcome. I will follow with you.    HPI: Wayne [name] is a 71 y.o. male who developed sudden left calf swelling and mid April. He was found to have a hematoma in his left popliteal fossa extending down into his calf related to his anticoagulation. On April 30 he developed a temperature to 101.9 and was started on empiric Keflex. He had 2 brief hospitalizations in early May for urinary retention and left calf swelling. He was on vancomycin during the hospitalizations and then switched to oral clindamycin which it appears he stayed throughout most of May. He was evaluated by Dr. Aldean Baker and it was elected to treat him conservatively. He states that he seemed to be getting better until late June when he noticed some spontaneous creamy yellow drainage from his left calf. He then became acutely ill and was admitted to the hospital on July 1 with sepsis and multiorgan dysfunction syndrome.  A large left calf abscess was incised and drained on July 2. Abscess cultures and 2 out of 2 blood cultures have grown methicillin sensitive staph aureus. He has defervesced and was able to be extubated today. He has little recall of recent events but is feeling better.   Review of Systems: Review of systems not obtained due to patient factors.  Past Medical History  Diagnosis Date  . DM   . HYPERLIPIDEMIA   . HYPERTENSION   . CORONARY ATHEROSCLEROSIS NATIVE CORONARY ARTERY     a. 01/2011 Cath/PCI: LM nl, LAD  40-50p, D1 80-small, LCX 95-small, RI 90, RCA 100, EF 20%;  b. 01/2011 Card MRI - No transmural scar;  c. 01/2011 PCI RCA->5 Promus DES, RI->3.0x16 Promus DES;  . Herpes zoster ophthalmicus   . Arthritis   . Obesity   . GERD (gastroesophageal reflux disease)   . Ischemic cardiomyopathy     a. 01/2012 Echo EF 45%, mild LVH.  Marland Kitchen Adenocarcinoma of prostate     s/p seed implants  . Polymyalgia rheumatica   . Noncompliance   . OSA (obstructive sleep apnea)   . Hematoma of leg     a. left leg hematoma 03/2012 - came off asa/effient  . Cellulitis of left leg     a. 05/2012 complicated by septic shock    History  Substance Use Topics  . Smoking status: Former Smoker -- 0.3 packs/day for 20 years    Types: Cigarettes    Quit date: 02/23/1989  . Smokeless tobacco: Never Used  . Alcohol Use: No    Family History  Problem Relation Age of Onset  . Cancer Mother 41    unknown CA  . Heart disease Father 78    MI, died  . Alcohol abuse Father    Allergies  Allergen Reactions  . Atorvastatin     REACTION: sore legs    OBJECTIVE: Blood pressure 99/48, pulse 97, temperature 97.2 F (36.2 C), temperature source Core (Comment), resp. rate 18, height 5\' 10"  (1.778 m), weight 112.9 kg (248 lb 14.4 oz), SpO2 100.00%. General: He is alert, comfortable and appears to be in no distress Skin: No scleral or conjunctival hemorrhages Lungs: Clear anteriorly Cor: Regular S1 and S2 no murmur Abdomen: Nontender Left calf is wrapped  Microbiology: Recent Results (from the past 240 hour(s))  CULTURE, BLOOD (ROUTINE X 2)     Status: Normal   Collection Time   06/14/12  1:20 PM      Component Value Range Status Comment   Specimen Description BLOOD ARM LEFT   Final    Special Requests BOTTLES DRAWN AEROBIC ONLY 10CC   Final    Culture  Setup Time 06/15/2012 02:15   Final    Culture     Final    Value: STAPHYLOCOCCUS AUREUS     Note: SUSCEPTIBILITIES PERFORMED ON PREVIOUS CULTURE WITHIN THE LAST 5  DAYS.     Note: Gram Stain Report Called to,Read Back By and Verified With: BURGUNDY Azar 06/15/12 1400 BY SMITHERSJ   Report Status 06/17/2012 FINAL   Final   CULTURE, BLOOD (ROUTINE X 2)     Status: Normal   Collection Time   06/14/12  1:40 PM      Component Value Range Status Comment   Specimen Description BLOOD LEFT HAND   Final    Special Requests BOTTLES DRAWN AEROBIC AND ANAEROBIC 10CC   Final    Culture  Setup Time 06/15/2012 02:15   Final  Culture     Final    Value: STAPHYLOCOCCUS AUREUS     Note: RIFAMPIN AND GENTAMICIN SHOULD NOT BE USED AS SINGLE DRUGS FOR TREATMENT OF STAPH INFECTIONS.     Note: Gram Stain Report Called to,Read Back By and Verified With: BRANDY DIN @ 1814 ON 06/15/12 BY GOLLD   Report Status 06/17/2012 FINAL   Final    Organism ID, Bacteria STAPHYLOCOCCUS AUREUS   Final   URINE CULTURE     Status: Normal   Collection Time   06/14/12  7:15 PM      Component Value Range Status Comment   Specimen Description URINE, RANDOM   Final    Special Requests NONE   Final    Culture  Setup Time 06/15/2012 01:48   Final    Colony Count NO GROWTH   Final    Culture NO GROWTH   Final    Report Status 06/16/2012 FINAL   Final   MRSA PCR SCREENING     Status: Normal   Collection Time   06/14/12  7:17 PM      Component Value Range Status Comment   MRSA by PCR NEGATIVE  NEGATIVE Final   GRAM STAIN     Status: Normal   Collection Time   06/15/12 12:39 PM      Component Value Range Status Comment   Specimen Description WOUND LEFT LOWER LEG   Final    Special Requests NONE   Final    Gram Stain     Final    Value: ABUNDANT WBC PRESENT,BOTH PMN AND MONONUCLEAR     ABUNDANT GRAM POSITIVE COCCI IN PAIRS IN CLUSTERS   Report Status 06/15/2012 FINAL   Final   ANAEROBIC CULTURE     Status: Normal (Preliminary result)   Collection Time   06/15/12 12:39 PM      Component Value Range Status Comment   Specimen Description WOUND LEFT LOWER LEG   Final    Special Requests NONE   Final      Gram Stain     Final    Value: ABUNDANT WBC PRESENT,BOTH PMN AND MONONUCLEAR     ABUNDANT GRAM POSITIVE COCCI IN PAIRS     IN CLUSTERS Performed at [facility]   Culture     Final    Value: NO ANAEROBES ISOLATED; CULTURE IN PROGRESS FOR 5 DAYS   Report Status PENDING   Incomplete   WOUND CULTURE     Status: Normal (Preliminary result)   Collection Time   06/15/12 12:39 PM      Component Value Range Status Comment   Specimen Description WOUND LEFT LOWER LEG   Final    Special Requests NONE   Final    Gram Stain PENDING   Incomplete    Culture     Final    Value: ABUNDANT STAPHYLOCOCCUS AUREUS     Note: RIFAMPIN AND GENTAMICIN SHOULD NOT BE USED AS SINGLE DRUGS FOR TREATMENT OF STAPH INFECTIONS.   Report Status PENDING   Incomplete    Organism ID, Bacteria STAPHYLOCOCCUS AUREUS   Final     Cliffton Asters, MD [facility] for Infectious Disease [facility] [phone] pager   [phone] cell 06/18/2012, 5:13 PM

## 2012-06-18 NOTE — Progress Notes (Signed)
Name: Wayne [name] MRN: [id number] DOB: 03/21/1941    LOS: 4 Date of admit 06/14/2012 12:48 PM PCP is Kristian Covey, MD Ortho is Dr Jonathon Bellows  Referring Provider:  Dr Preston Fleeting of ER Reason for Referral:  Sepsis, hypotension, acidsis , hyperglycemia  PULMONARY / CRITICAL CARE MEDICINE   71 yr old septic shock, resp failure, leg source. S/p irrigation and debridement of LE on 7/2  Events Since Admission: 7/2- refractory shock post op, acidosis, arf, epi required 7/2 irrigation and debridement of LLE 7/2 2 units PRBC 7/3- Improved pressors 7/5- levo remains 4 mics  Overnight: Doing very well. NAEO.   Current Status: Intubated, AAOx3  Lines/Tubes: ETT 7/1>> NG Tube 7/1 >> Foley 7/1>>> RIJ 7/1>>> R Radial 7/1>>> RPIV 7/1>>> LPIV 7/1>>> Premrose Drains7/2>>>  ABX: Vanc 7/1 >>> 7/2 Zosyn 7/1 >>> 7/2 7/2 clinda>>>7/3 Linezolid 7/2>>> Imipenim 7/2>>>  Vital Signs: Temp:  [95.9 F (35.5 C)-97 F (36.1 C)] 97 F (36.1 C) (07/05 0715) Pulse Rate:  [72-109] 94  (07/05 0715) Resp:  [11-23] 14  (07/05 0715) BP: (83-149)/(45-80) 94/50 mmHg (07/05 0715) SpO2:  [75 %-100 %] 99 % (07/05 0715) Arterial Line BP: (73-130)/(39-57) 129/50 mmHg (07/04 1730) FiO2 (%):  [30 %-40 %] 30 % (07/05 0630) Weight:  [248 lb 14.4 oz (112.9 kg)] 248 lb 14.4 oz (112.9 kg) (07/05 0500)  Physical Examination: General:  Obese male. In bed. Intubated, minimal distress  Neuro:  AA HEENT:  PEERL +, EOMI Cardiovascular:  RRR, Normal heart sounds Lungs:  CTAB, intubated Abdomen:  Obese,  Musculoskeletal:  LLE -dressings c/d/i. LE bilat warm to touch w/ 2+ pulses.  Skin:  LLE bandages in place s/p surgery. Otherwise itnact  Principal Problem:  *Severe sepsis Active Problems:  HYPERTROPHY PROSTATE W/UR OBST & OTH LUTS  CORONARY ATHEROSCLEROSIS NATIVE CORONARY ARTERY  Polymyalgia rheumatica  Systolic CHF, chronic  OSA (obstructive sleep apnea)  Obesity  Metabolic acidosis  Cellulitis and  abscess of lower leg  Acute respiratory failure  Healthcare-associated pneumonia  Hyperglycemia  Acute encephalopathy  NSTEMI, initial episode of care   ASSESSMENT AND PLAN  PULMONARY  Lab 06/18/12 0330 06/17/12 0445 06/16/12 0544 06/15/12 1737 06/15/12 1720 06/15/12 1539  PHART 7.475* 7.496* 7.416 7.260* -- 7.255*  PCO2ART 32.5* 29.6* 28.3* 28.8* -- 34.5*  PO2ART 99.4 158.0* 125.0* 125.0* -- 99.8  HCO3 23.9 22.8 18.0* 12.9* -- 14.8*  O2SAT 99.6 100.0 99.0 98.0 76.5 --   Ventilator Settings: Vent Mode:  [-] PRVC FiO2 (%):  [30 %-40 %] 30 % Set Rate:  [12 bmp] 12 bmp Vt Set:  [550 mL] 550 mL PEEP:  [5 cmH20] 5 cmH20 Plateau Pressure:  [10 cmH20-16 cmH20] 13 cmH20  CXR:  Improved aeration. Official read pending  ETT: 7/1>>>  A:  Acute Resp Distress due to metabolic acidosis +/- LLL PNA,  r/o ards, not impressed overload. CXR this am overall improved airation P:   abg reviewed, on  Min MV -wean cpap 5 ps 5, assess rsbi, mentation good pcxr in am, todays resolving , less likely a ARDS component -may need neg balance soon, mild int prom -will wean on low dose levo  CARDIOVASCULAR  Lab 06/15/12 0950 06/14/12 2350 06/14/12 1818 06/14/12 1635 06/14/12 1634  TROPONINI 7.90* 5.17* -- -- 3.49*  LATICACIDVEN -- -- -- 3.9* --  PROBNP -- -- 24481.0* -- --    Lab 06/18/12 0500 06/17/12 2300 06/17/12 1700 06/17/12 0440 06/16/12 1700  WBC 16.7* -- -- 20.9* 22.2*  HGB  7.6* 8.0* 8.3* -- --  HCT 23.1* 23.6* 23.8* -- --  PLT 225 -- -- 253 268    ECG: no St changes Lines:  RIJ 7/1>>> R Radial 7/1>>>7/4 RPIV 7/1>>> LPIV 7/1>>>  A: Severe sepsis and hypotension - Pt EF of 45%. bp improved w/ multiple pressors and significant fluid boluses. Now on Levo,  and Vaso. Heparin Drip DCd from bleeding. Troponins were elevated again on 7/2, likely from cardiac strain. refractory shock requiring epi on top levo 75 on 7/2. Pt only on Levo of 6 this am P:  Levo of 4, Continue to wean, MAP  goal 60 with history ef 45% edgt completed 7/2 Echo ordered and pending to assess reg wall- 15%, diffuse hypo - septic cardiomyopathy likely, repeat as outpt Asa Stress steroids continue until off pressors With such sig improvement hemodynamic mis, no cp, no angina, will lower hgb Tx threshold Neg  Balance may be needed son for diltuion  RENAL  Lab 06/18/12 0500 06/17/12 1721 06/17/12 0440 06/16/12 1700 06/16/12 1300 06/16/12 0500 06/15/12 0400 06/14/12 1817  NA 136 135 139 140 -- 137 -- --  K 4.1 4.3 -- -- -- -- -- --  CL 103 103 105 105 -- 104 -- --  CO2 23 22 19 20  -- 18* -- --  BUN 31* 32* 29* 29* -- 29* -- --  CREATININE 2.15* 2.59* 2.69* 3.11* -- 2.90* -- --  CALCIUM 7.6* 7.7* 7.5* 6.9* -- 7.3* -- --  MG 2.2 -- 2.2 -- 2.0 1.6 1.4* --  PHOS 3.0 3.1 2.7 -- -- -- 2.8 2.2*   Intake/Output      07/04 0701 - 07/05 0700 07/05 0701 - 07/06 0700   I.V. (mL/kg) 985.1 (8.7)    NG/GT 1095    IV Piggyback 910    Total Intake(mL/kg) 2990.1 (26.5)    Urine (mL/kg/hr) 87 (0)    Other 2213    Total Output 2300    Net +690.1         Stool Occurrence 3 x     Foley: not placed at admit 06/14/2012 12:48 PM   A:  Severe metabolic acidosis, suspect due to sepsis lactic acid. Cr bump likely from AKI resulting in ATN from hypoperfusion from severe sepsis. On CVVHD today. Oliguric w/ 75 cc in past 24hrs., hypokalemia resolved. Mixed small AG  / non ag, improved since admission. Renal US unremarkable. + 10.7L since admission, +690 for the day P:   Continue CVVHD today Monitor i/o w/ no net change as goal, may need neg soon bmet in am  GASTROINTESTINAL  Lab 06/18/12 0500 06/17/12 1721 06/17/12 0440 06/16/12 1700 06/16/12 0500 06/14/12 1817 06/14/12 1307  AST -- -- -- 35 58* 43* 36  ALT -- -- -- 30 38 23 27  ALKPHOS -- -- -- 86 67 97 83  BILITOT -- -- -- 0.5 0.8 0.3 0.5  PROT -- -- -- 4.7* 4.7* 5.5* 6.9  ALBUMIN 1.4* 1.5* 1.4* 1.4* 1.4* -- --    A:  Tolerating TF P:   ppi - hold TF  for weaning  HEMATOLOGIC  Lab 06/18/12 0500 06/17/12 2300 06/17/12 1700 06/17/12 1132 06/17/12 0440 06/16/12 1700 06/16/12 0500 06/15/12 1720 06/14/12 1817  HGB 7.6* 8.0* 8.3* 8.6* 8.9* -- -- -- --  HCT 23.1* 23.6* 23.8* 24.9* 25.5* -- -- -- --  PLT 225 -- -- -- 253 268 263 391 --  INR -- -- -- -- -- -- -- -- 1.55*  APTT  57* -- -- -- -- -- -- -- 38*   A:  Anemia of chrnic and critical illness. Received 2 units PRBC on 7/2. Hgb continues to drop slowly. 7.6 this am P:  Transfuse today as Hgb below goal of 8.4 for ischemia - cbc in am -sub q heparin  INFECTIOUS  Lab 06/18/12 0500 06/17/12 0440 06/16/12 1700 06/16/12 0500 06/15/12 1720 06/14/12 1634  WBC 16.7* 20.9* 22.2* 19.8* 31.0* --  PROCALCITON -- -- -- -- -- 7.60   Cultures: 7/1 BCX: >>> SA pansens 7/1 BCX:>>> SA 7/1 UCX >>>  7/2 Wound CX: >>> SA  ABX:  Vanc 7/1 >>> 7/2 Zosyn 7/1 >>> 7/2 7/2 clinda>>> 7/3 Linezolid 7/2>>>7/5 Imipenim 7/2>>>7/5 Ancef 7/5>>>  A:  Bacteremia and Sepsis w/ SA that is pan sensitive. Cellulitis LLE w/ Abscess, s/p irriagation and debridement POD3. Now afebrile and normal HR. Likely toxic shock appearance from staph P:   Consider narrowing therapy to ancef Follow final of wound and blood sensitivities If worsen or not off pressors, re add clinda for toxin inh ID consult, TEE? Length of therpay  ENDOCRINE  Lab 06/18/12 0558 06/18/12 0516 06/18/12 0357 06/18/12 0304 06/18/12 0202  GLUCAP 174* 156* 151* 148* 129*   A:  Hyperglycemia likely reaction to sepsis and not DKA . Known diabetic on oral agents. Cortisol borderline P:   No oral agents in hospital ICU hyperglycemia protocol. Will transition to lnatus/SSI if extubation and takin PO.  Cortisol just borderline under the stress he was under, maintain stress steroids (50mg  IV Q6h)  NEUROLOGIC  A:  Confused at admit. Normalized with fluids and bp increase. Fentanyl 50-100 PRN P:   WUA Cont fent  GLOBAL  - He said he would  rather die rather than amputation  BEST PRACTICE / DISPOSITION Level of Care:  icu Primary Service:  pccm Consultants:  Ortho Dr Lajoyce Corners  Code Status:  Full  Diet:  TF 7/3 DVT Px:  Sq heparin GI Px:  ppi Skin Integrity:  Intact other than LLE Social / Family: son updated   Shelly Flatten, MD Family Medicine PGY-2 06/18/2012, 7:38 AM   I have fully examined this patient and agree with above findings.    And edited in full  Ccm time 30 min   Mcarthur Rossetti. Tyson Alias, MD, FACP Pgr: [phone] [facility]

## 2012-06-18 NOTE — Care Management Note (Signed)
    Page 1 of 2   07/02/2012     4:22:34 PM   CARE MANAGEMENT NOTE 07/02/2012  Patient:  Wayne [name], Wayne [name]   Account Number:  [id number]  Date Initiated:  06/15/2012  Documentation initiated by:  Avie Arenas  Subjective/Objective Assessment:   septic, hypotensive.  Has wife.     Action/Plan:   Discharge planning   Anticipated DC Date:  07/02/2012   Anticipated DC Plan:  LONG TERM ACUTE CARE (LTAC)      DC Planning Services  CM consult      PAC Choice  LONG TERM ACUTE CARE   Choice offered to / List presented to:             Status of service:  Completed, signed off Medicare Important Message given?   (If response is "NO", the following Medicare IM given date fields will be blank) Date Medicare IM given:   Date Additional Medicare IM given:    Discharge Disposition:  LONG TERM ACUTE CARE (LTAC)  Per UR Regulation:  Reviewed for med. necessity/level of care/duration of stay  If discussed at Long Length of Stay Meetings, dates discussed:   06/22/2012  06/24/2012  06/29/2012  07/01/2012    Comments:  Contact:  Krenn,Edith Spouse [phone]   [phone]  07/02/12- 1400- Donn Pierini RN, BSN [phone] Pt with orders to go to [facility] today, spoke with Boneta Lucks with [facility] who confirmed that they are to admit pt today. Spoke with pt and wife who agree with plan.  07/02/12- 1000- Donn Pierini RN, BSN [phone] Spoke with Dr. Butler Denmark regarding pt and wife's interest in LTAC and possible d/c - PCCM to come see pt- pt for possible d/c to [facility] today.  07/01/12- 1600- Donn Pierini RN, BSN [phone] Spoke with pt and wife at bedside regarding d/c plans and options. Explained both LTAC and CIR options along with HH and ST-SNF. Questions answered and at this time both pt and wife are interested in LTAC option as first choice and want to look at [facility]. Left sticky note in chart for MD to discuss LTAC option with pt and wife- Per [facility] rep. they have bed available as soon as tomorrow for  admission to LTAC.  06/28/12- 1500- Donn Pierini RN, BSN [phone] CIR consulted, pt for possible OR later this week for further debridement- d/c planning for CIR vs LTAC when medically stable.  06/25/12 1400 Tera Mater, RN, BSN [phone] Received order for LTAC referral.  TC to Boneta Lucks, with [facility], to make referral.  Return call from [place] and she stated pt. would be an appropriate candidate for [facility] and they did have a bed ready.  This NCM spoke with spouse, Rubin Payor, and daughter at bedside to facilitate discharge planning.  Rubin Payor was interested in pt. going to [facility] when his is medically stable.  She is concerned about the rash he has recently developed from the Iv antibiotic he has taken.  TC to Dr. Delford Field to give update on LTAC avaliability.  NCM to continue to follow for discharge needs.   06/22/12- 1018- Donn Pierini RN, BSN [phone] Pt transferred to SDU on 06/21/12, lives at home with wife- will need PT/OT evals to assist with d/c planning  06-18-12 3:28pm Avie Arenas, RNBSN - [phone] Extubated - continues on pressos - weaning - will need PT/OT consult when pressors off.

## 2012-06-18 NOTE — Progress Notes (Signed)
Assessment and Plan:  1. Severe sepsis, Staph Aureus. Continue antibiotics  2. Oliguric ARF-on CVVHD, Continue to keep even today  3. Cellulitis/fasciitis left popliteal-s/p drainage per Dr. Lajoyce Corners  4. NSTEMI -per cardiology  5. VDRF  Subjective: Interval History: Weaning pressors  Objective: Vital signs in last 24 hours: Temp:  [95.9 F (35.5 C)-97.1 F (36.2 C)] 97.1 F (36.2 C) (07/05 0730) Pulse Rate:  [72-109] 96  (07/05 0730) Resp:  [11-23] 19  (07/05 0730) BP: (83-149)/(45-80) 91/50 mmHg (07/05 0730) SpO2:  [75 %-100 %] 100 % (07/05 0730) Arterial Line BP: (73-130)/(39-57) 129/50 mmHg (07/04 1730) FiO2 (%):  [30 %-40 %] 30 % (07/05 0630) Weight:  [112.9 kg (248 lb 14.4 oz)] 112.9 kg (248 lb 14.4 oz) (07/05 0500) Weight change: -1.1 kg (-2 lb 6.8 oz)  Intake/Output from previous day: 07/04 0701 - 07/05 0700 In: 2990.1 [I.V.:985.1; NG/GT:1095; IV Piggyback:910] Out: 2300 [Urine:87] Intake/Output this shift:    General appearance: alert and cooperative Head: Normocephalic, without obvious abnormality, atraumatic Resp: clear to auscultation bilaterally Chest wall: no tenderness GI: soft, non-tender; bowel sounds normal; no masses,  no organomegaly Incision/Wound: lle bandaged weeping,  Lab Results:  Basename 06/18/12 0500 06/17/12 2300 06/17/12 0440  WBC 16.7* -- 20.9*  HGB 7.6* 8.0* --  HCT 23.1* 23.6* --  PLT 225 -- 253   BMET:  Basename 06/18/12 0500 06/17/12 1721  NA 136 135  K 4.1 4.3  CL 103 103  CO2 23 22  GLUCOSE 163* 182*  BUN 31* 32*  CREATININE 2.15* 2.59*  CALCIUM 7.6* 7.7*   No results found for this basename: PTH:2 in the last 72 hours Iron Studies: No results found for this basename: IRON,TIBC,TRANSFERRIN,FERRITIN in the last 72 hours Studies/Results: US Renal  06/16/2012  *RADIOLOGY REPORT*  Clinical Data: Acute renal failure, diabetes, history of hypertension  RENAL/URINARY TRACT ULTRASOUND COMPLETE  Comparison:  None.  Findings:  Right  Kidney:  No hydronephrosis is seen.  The right kidney measures 11.7 cm sagittally.  The echogenicity of the renal parenchyma appears to be within normal limits.  Left Kidney:  No hydronephrosis is noted.  The left kidney measures 12.5 cm.  The echogenicity of the renal parenchyma is within normal limits.  Bladder:  The urinary bladder is decompressed with a Foley catheter.  IMPRESSION: No hydronephrosis.  The urinary bladder is decompressed with Foley catheter.  Original Report Authenticated By: Juline Patch, M.D.   Dg Chest Port 1 View  06/18/2012  *RADIOLOGY REPORT*  Clinical Data: Ventilator support.  Hypoxia.  PORTABLE CHEST - 1 VIEW  Comparison: 06/17/2012  Findings: Endotracheal tube has its tip 2.5 cm above the carina. Nasogastric tube enters the stomach.  Left internal jugular catheter has its tip in the SVC at the azygos level.  Right internal jugular central line has its tip in the SVC at the azygos level.  No pneumothorax.  There is volume loss in the lower lobes left worse than right.  IMPRESSION: Lines and tubes well positioned.  Persistent lower lobe volume loss left worse than right.  Original Report Authenticated By: Thomasenia Sales, M.D.   Dg Chest Port 1 View  06/17/2012  *RADIOLOGY REPORT*  Clinical Data: Left IJ hemodialysis catheter exchange.  Rule out pneumothorax.  PORTABLE CHEST - 1 VIEW  Comparison: 06/17/2012  Findings: The left internal jugular dialysis catheter is in place. The tip is directed laterally into the wall of the upper SVC.  No pneumothorax.  Endotracheal tube, NG tube  and right central line are unchanged.  There is cardiomegaly with vascular congestion.  Bibasilar atelectasis, similar to prior study.  No definite effusions.  IMPRESSION: Left internal jugular dialysis catheter tip is directed transversely into the wall of the upper superior vena cava.  No pneumothorax.  Cardiomegaly, vascular congestion and bibasilar atelectasis, stable.  Original Report Authenticated By:  Cyndie Chime, M.D.   Dg Chest Port 1 View  06/17/2012  *RADIOLOGY REPORT*  Clinical Data: Evaluate endotracheal tube placement  PORTABLE CHEST - 1 VIEW  Comparison: Portable chest x-ray of 06/16/2012  Findings: The tip of the endotracheal tube is approximately 4.8 cm above the carina. Opacity at the left lung base remains although aeration has improved slightly.  There may be a small left pleural effusion present.  Cardiomegaly is stable.  Left central venous line and right central venous line are unchanged in position.  IMPRESSION:  1.  Tip of endotracheal tube approximately 4.3 cm above carina. 2.  Little change in left basilar opacity and cardiomegaly.  Original Report Authenticated By: Juline Patch, M.D.   Dg Chest Port 1 View  06/16/2012  *RADIOLOGY REPORT*  Clinical Data: Central line placement.  PORTABLE CHEST - 1 VIEW  Comparison: Earlier film, same date.  Findings: The endotracheal tube, right IJ catheter and NG tubes are stable.  The new left IJ catheter tip is in the SVC at the junction of the left brachiocephalic vein.  No complicating features.  Lower lung volumes with mild vascular crowding and atelectasis.  IMPRESSION: Left IJ catheter tip is in the mid SVC at the brachiocephalic junction region.  Original Report Authenticated By: P. Loralie Champagne, M.D.    Scheduled:   . alteplase  2 mg Intracatheter STAT  . antiseptic oral rinse  15 mL Mouth Rinse QID  . aspirin  325 mg Per Tube Daily  . chlorhexidine  15 mL Mouth Rinse BID  . feeding supplement  30 mL Per Tube 5 X Daily  . heparin subcutaneous  5,000 Units Subcutaneous Q8H  . hydrocortisone sod succinate (SOLU-CORTEF) injection  50 mg Intravenous Q6H  . imipenem-cilastatin  500 mg Intravenous Q6H  . linezolid  600 mg Intravenous Q12H  . pantoprazole (PROTONIX) IV  40 mg Intravenous QHS  . sodium chloride  2,000 mL Intravenous Once  . DISCONTD: imipenem-cilastatin  250 mg Intravenous Q6H      LOS: 4 days   Edison Nicholson  C 06/18/2012,8:12 AM

## 2012-06-19 ENCOUNTER — Inpatient Hospital Stay (HOSPITAL_COMMUNITY): Payer: Medicare Other

## 2012-06-19 DIAGNOSIS — A4101 Sepsis due to Methicillin susceptible Staphylococcus aureus: Principal | ICD-10-CM

## 2012-06-19 LAB — POCT ACTIVATED CLOTTING TIME
Activated Clotting Time: 179 seconds
Activated Clotting Time: 179 seconds
Activated Clotting Time: 179 seconds
Activated Clotting Time: 179 seconds
Activated Clotting Time: 194 seconds
Activated Clotting Time: 204 seconds
Activated Clotting Time: 204 seconds

## 2012-06-19 LAB — RENAL FUNCTION PANEL
CO2: 25 mEq/L (ref 19–32)
GFR calc Af Amer: 41 mL/min — ABNORMAL LOW (ref >90)
Glucose, Bld: 246 mg/dL — ABNORMAL HIGH (ref 70–99)
Phosphorus: 3.2 mg/dL (ref 2.3–4.6)
Potassium: 4.4 mEq/L (ref 3.5–5.1)
Sodium: 135 mEq/L (ref 135–145)

## 2012-06-19 LAB — CBC WITH DIFFERENTIAL/PLATELET
Basophils Absolute: 0 10*3/uL (ref 0.0–0.1)
Basophils Relative: 0 % (ref 0–1)
Eosinophils Absolute: 0 10*3/uL (ref 0.0–0.7)
HCT: 26.1 % — ABNORMAL LOW (ref 39.0–52.0)
MCH: 26.9 pg (ref 26.0–34.0)
MCHC: 33.3 g/dL (ref 30.0–36.0)
Monocytes Absolute: 0.7 10*3/uL (ref 0.1–1.0)
Neutro Abs: 8.3 10*3/uL — ABNORMAL HIGH (ref 1.7–7.7)
RDW: 15.5 % (ref 11.5–15.5)

## 2012-06-19 LAB — GLUCOSE, CAPILLARY
Glucose-Capillary: 158 mg/dL — ABNORMAL HIGH (ref 70–99)
Glucose-Capillary: 222 mg/dL — ABNORMAL HIGH (ref 70–99)
Glucose-Capillary: 85 mg/dL (ref 70–99)

## 2012-06-19 LAB — PROTIME-INR: INR: 1.13 (ref 0.00–1.49)

## 2012-06-19 LAB — COMPREHENSIVE METABOLIC PANEL
ALT: 9 U/L (ref 0–53)
Alkaline Phosphatase: 75 U/L (ref 39–117)
CO2: 25 mEq/L (ref 19–32)
GFR calc Af Amer: 31 mL/min — ABNORMAL LOW (ref >90)
GFR calc non Af Amer: 27 mL/min — ABNORMAL LOW (ref >90)
Glucose, Bld: 169 mg/dL — ABNORMAL HIGH (ref 70–99)
Potassium: 3.7 mEq/L (ref 3.5–5.1)
Sodium: 134 mEq/L — ABNORMAL LOW (ref 135–145)
Total Protein: 4.6 g/dL — ABNORMAL LOW (ref 6.0–8.3)

## 2012-06-19 LAB — CBC
Hemoglobin: 8.6 g/dL — ABNORMAL LOW (ref 13.0–17.0)
MCH: 27.1 pg (ref 26.0–34.0)
MCHC: 33.5 g/dL (ref 30.0–36.0)
RDW: 15.9 % — ABNORMAL HIGH (ref 11.5–15.5)

## 2012-06-19 LAB — HEMOGLOBIN AND HEMATOCRIT, BLOOD
Hemoglobin: 6.8 g/dL — CL (ref 13.0–17.0)
Hemoglobin: 8.8 g/dL — ABNORMAL LOW (ref 13.0–17.0)

## 2012-06-19 LAB — MAGNESIUM: Magnesium: 2.4 mg/dL (ref 1.5–2.5)

## 2012-06-19 MED ORDER — FENTANYL CITRATE 0.05 MG/ML IJ SOLN
50.0000 ug | Freq: Once | INTRAMUSCULAR | Status: AC
  Administered 2012-06-19: 50 ug via INTRAVENOUS

## 2012-06-19 MED ORDER — HYDROCORTISONE SOD SUCCINATE 100 MG IJ SOLR
50.0000 mg | Freq: Two times a day (BID) | INTRAMUSCULAR | Status: DC
  Administered 2012-06-19 – 2012-06-20 (×3): 50 mg via INTRAVENOUS
  Filled 2012-06-19 (×4): qty 1

## 2012-06-19 MED ORDER — OXYCODONE HCL 5 MG PO TABS
5.0000 mg | ORAL_TABLET | ORAL | Status: DC | PRN
  Administered 2012-06-19 – 2012-06-29 (×17): 5 mg via ORAL
  Filled 2012-06-19 (×19): qty 1

## 2012-06-19 MED ORDER — SODIUM CHLORIDE 0.9 % IV BOLUS (SEPSIS)
500.0000 mL | Freq: Once | INTRAVENOUS | Status: AC
  Administered 2012-06-19: 500 mL via INTRAVENOUS

## 2012-06-19 MED ORDER — LIDOCAINE 5 % EX PTCH
1.0000 | MEDICATED_PATCH | Freq: Every day | CUTANEOUS | Status: DC | PRN
  Filled 2012-06-19 (×2): qty 1

## 2012-06-19 NOTE — Progress Notes (Signed)
CRITICAL VALUE ALERT  Critical value received: hgb 6.8   Date of notification:  06-19-12  Time of notification:  0058  Critical value read back:yes  Nurse who received alert:  Jamesetta So RN   MD notified (1st page):  [name] MD resident CCM  Notified directly while on unit at [phone]

## 2012-06-19 NOTE — Progress Notes (Signed)
Name: Wayne [name] MRN: [id number] DOB: [birth year]    LOS: 5 Date of admit 06/14/2012 12:48 PM PCP is Kristian Covey, MD Ortho is Dr Jonathon Bellows  Referring Provider:  Dr Preston Fleeting of ER Reason for Referral:  Sepsis, hypotension, acidsis , hyperglycemia  PULMONARY / CRITICAL CARE MEDICINE  71 yr old septic shock, resp failure, leg source. S/p irrigation and debridement of LE on 7/2  Events Since Admission: 7/2- refractory shock post op, acidosis, arf, epi required 7/2 irrigation and debridement of LLE 7/2 2 units PRBC 7/3- Improved pressors 7/5- Off all pressors 7/6 2 units PRBC  Overnight: Hgb continued to drop. Received 2 units PRBC. Tolerating regular diet  Current Status: Extubated 7/5, comfortable, conversant  Lines/Tubes: ETT 7/1>>7/5 NG Tube 7/1 >>7/5 Foley 7/1>>> RIJ 7/1>>> R Radial 7/1>>>7/4 RPIV 7/1>>> LPIV 7/1>>> Premrose Drains7/2>>>7/5  ABX: Clement Husbands 7/1 >>> 7/2 Zosyn 7/1 >>> 7/2 7/2 clinda>>>7/3 Linezolid 7/2>>>7/5 Imipenim 7/2>>>7/5 Cefazolin 7/5 >>>  Vital Signs: Temp:  [96.8 F (36 C)-97.8 F (36.6 C)] 97.4 F (36.3 C) (07/06 0600) Pulse Rate:  [91-112] 108  (07/06 0600) Resp:  [9-23] 18  (07/06 0600) BP: (61-152)/(31-129) 112/68 mmHg (07/06 0600) SpO2:  [90 %-100 %] 100 % (07/06 0600) FiO2 (%):  [30 %] 30 % (07/05 0908)  Physical Examination: General:  Obese male. In bed. extubated, minimal distress  Neuro:  A&O, maew HEENT:  PEERL +, EOMI Cardiovascular:  RRR, Normal heart sounds Lungs:  CTAB, Abdomen:  Obese,  Musculoskeletal:  LLE -dressings c/d/i. LE bilat warm to touch w/ 2+ pulses.  Skin:  LLE bandages in place s/p surgery. Otherwise itnact  Principal Problem:  *Staphylococcus aureus bacteremia with sepsis Active Problems:  HYPERTROPHY PROSTATE W/UR OBST & OTH LUTS  CORONARY ATHEROSCLEROSIS NATIVE CORONARY ARTERY  Polymyalgia rheumatica  Systolic CHF, chronic  OSA (obstructive sleep apnea)  Obesity  Metabolic acidosis  Cellulitis  and abscess of lower leg  Acute respiratory failure  Healthcare-associated pneumonia  Hyperglycemia  Acute encephalopathy  NSTEMI, initial episode of care   ASSESSMENT AND PLAN  PULMONARY  Lab 06/18/12 0330 06/17/12 0445 06/16/12 0544 06/15/12 1737 06/15/12 1720 06/15/12 1539  PHART 7.475* 7.496* 7.416 7.260* -- 7.255*  PCO2ART 32.5* 29.6* 28.3* 28.8* -- 34.5*  PO2ART 99.4 158.0* 125.0* 125.0* -- 99.8  HCO3 23.9 22.8 18.0* 12.9* -- 14.8*  O2SAT 99.6 100.0 99.0 98.0 76.5 --   Ventilator Settings: Extubated on RA  CXR:  Improved aeration. Official read pending  ETT: 7/1>>>  A:  Acute Resp Distress due to metabolic acidosis +/- LLL PNA,  r/o ards, not impressed overload. Resolving. Doing well since extubation 7/5. No supplemental O2 requirement P:   - Supplemental O2 PRN  - CXR if deteriorates    CARDIOVASCULAR  Lab 06/15/12 0950 06/14/12 2350 06/14/12 1818 06/14/12 1635 06/14/12 1634  TROPONINI 7.90* 5.17* -- -- 3.49*  LATICACIDVEN -- -- -- 3.9* --  PROBNP -- -- 24481.0* -- --    Lab 06/18/12 2300 06/18/12 1620 06/18/12 1100 06/18/12 0500 06/17/12 0440 06/16/12 1700  WBC -- -- -- 16.7* 20.9* 22.2*  HGB 6.8* 7.0* 7.4* -- -- --  HCT 20.7* 21.0* 21.9* -- -- --  PLT -- -- -- 225 253 268    ECG: no St changes Lines:  RIJ 7/1>>> R Radial 7/1>>>7/4   A: Severe sepsis and hypotension - Pt EF of 45%. Echo ordered as inpt showing EF 15%, diffuse hypo. 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 Repeat as outpt. BP improved  w/ multiple pressors and significant fluid boluses. Now off all pressors. Hepain w/ HD. Troponins were elevated again on 7/2, likely from cardiac strain. No CP. Received 2 units PRBC overnight.  P:   Elenora Fender Stress steroids now that pt off pressers CBC in am  RENAL  Lab 06/18/12 1600 06/18/12 0500 06/17/12 1721 06/17/12 0440 06/16/12 1700 06/16/12 1300 06/16/12 0500 06/15/12 0400  NA 136 136 135 139 140 -- -- --  K 3.9 4.1 -- -- -- -- -- --    CL 102 103 103 105 105 -- -- --  CO2 26 23 22 19 20  -- -- --  BUN 29* 31* 32* 29* 29* -- -- --  CREATININE 1.86* 2.15* 2.59* 2.69* 3.11* -- -- --  CALCIUM 7.4* 7.6* 7.7* 7.5* 6.9* -- -- --  MG -- 2.2 -- 2.2 -- 2.0 1.6 1.4*  PHOS 2.3 3.0 3.1 2.7 -- -- -- 2.8   Intake/Output      07/05 0701 - 07/06 0700   P.O. 580   I.V. (mL/kg) 547.3 (4.8)   Blood 325   NG/GT 175   IV Piggyback 100   Total Intake(mL/kg) 1727.3 (15.3)   Urine (mL/kg/hr) 171 (0.1)   Other 1540   Stool 200   Total Output 1911   Net -183.7        Foley: not placed at admit 06/14/2012 12:48 PM   A:  Severe metabolic acidosis, suspect due to sepsis lactic acid. Cr bump likely from AKI resulting in ATN from hypoperfusion from severe sepsis. No anion gap. On CVVHD today. Oliguric w/ 177 cc in past 24hrs., Renal US unremarkable. + 10.5 L since admission, -432 for the day P:   Continue CVVHD, f/u with renal Monitor i/o  bmet in am  GASTROINTESTINAL  Lab 06/18/12 1600 06/18/12 0500 06/17/12 1721 06/17/12 0440 06/16/12 1700 06/16/12 0500 06/14/12 1817 06/14/12 1307  AST -- -- -- -- 35 58* 43* 36  ALT -- -- -- -- 30 38 23 27  ALKPHOS -- -- -- -- 86 67 97 83  BILITOT -- -- -- -- 0.5 0.8 0.3 0.5  PROT -- -- -- -- 4.7* 4.7* 5.5* 6.9  ALBUMIN 1.4* 1.4* 1.5* 1.4* 1.4* -- -- --    A:  Tolerating PO P:   ppi - hold TF - bedside swallow eval by RN  HEMATOLOGIC  Lab 06/19/12 0200 06/18/12 2300 06/18/12 1620 06/18/12 1100 06/18/12 0500 06/17/12 2300 06/17/12 0440 06/16/12 1700 06/16/12 0500 06/15/12 1720 06/14/12 1817  HGB -- 6.8* 7.0* 7.4* 7.6* 8.0* -- -- -- -- --  HCT -- 20.7* 21.0* 21.9* 23.1* 23.6* -- -- -- -- --  PLT -- -- -- -- 225 -- 253 268 263 391 --  INR 1.13 -- -- -- -- -- -- -- -- -- 1.55*  APTT >200* -- -- -- 86* -- -- -- -- -- 38*   A:  Received 2 u nits PRBC overnight related to bleeding from dressing while on CVVHD.  P:  - cbc in am - monitor dressing oozin - CVVHD and hep off  today?  INFECTIOUS  Lab 06/18/12 0500 06/17/12 0440 06/16/12 1700 06/16/12 0500 06/15/12 1720 06/14/12 1634  WBC 16.7* 20.9* 22.2* 19.8* 31.0* --  PROCALCITON -- -- -- -- -- 7.60   Cultures: 7/1 BCX: >>> SA pansens 7/1 BCX:>>> SA 7/1 UCX >>>  7/2 Wound CX: >>> SA  ABX:  Vanc 7/1 >>> 7/2 Zosyn 7/1 >>> 7/2 7/2 clinda>>> 7/3 Linezolid 7/2>>>7/5 Imipenim  7/2>>>7/5 Ancef 7/5>>>  A:  Bacteremia and Sepsis w/ SA that is pan sensitive. Cellulitis LLE w/ Abscess, s/p irriagation and debridement POD3. Now afebrile and normal HR. Likely toxic shock appearance from staph.  P:   Continue ancef ID consult, TEE. Length of therpay  ENDOCRINE  Lab 06/19/12 0016 06/18/12 2130 06/18/12 1707 06/18/12 1551 06/18/12 1219  GLUCAP 222* 225* 218* 253* 143*   A:  Hyperglycemia likely reaction to sepsis and not DKA . Known diabetic on oral agents. Cortisol is off so will likely improve. Now on lantus and resistent SSI P:   No oral agents in hospital Continue SSI and Lantus   NEUROLOGIC  A:  Confused at admit. Normalized with fluids and bp increase. Fentanyl Iv and lidoderm patch P:   WUA Cont fent. Consider transitioning to PO 7/7   BEST PRACTICE / DISPOSITION Level of Care:  icu Primary Service:  pccm Consultants:  Ortho Dr Lajoyce Corners  Code Status:  Full  Diet:  TF 7/3 DVT Px:  SCD, Hep GI Px:  ppi Skin Integrity:  Intact other than LLE Social / Family: wife updated on 7/5  Shelly Flatten, MD Family Medicine PGY-2 06/19/2012, 6:23 AM   Attending: I have seen and examined the patient with nurse practitioner/resident and agree with the note above.   Amazing turn around.  I have edited the note above.  Yolonda Kida PCCM Pager: [phone] Cell: [phone] If no response, call [phone]

## 2012-06-19 NOTE — Progress Notes (Signed)
Patient ID: Wayne [name], male   DOB: [birth year], 71 y.o.   MRN: [id number]    [facility]    Date of Admission:  06/14/2012    Total days of antibiotics 6        Day 2 cefazolin         Principal Problem:  *Staphylococcus aureus bacteremia with sepsis Active Problems:  Cellulitis and abscess of lower leg  HYPERTROPHY PROSTATE W/UR OBST & OTH LUTS  CORONARY ATHEROSCLEROSIS NATIVE CORONARY ARTERY  Polymyalgia rheumatica  Systolic CHF, chronic  OSA (obstructive sleep apnea)  Obesity  Metabolic acidosis  Acute respiratory failure  Healthcare-associated pneumonia  Hyperglycemia  Acute encephalopathy  NSTEMI, initial episode of care      . aspirin  325 mg Per Tube Daily  .  ceFAZolin (ANCEF) IV  2 g Intravenous Q12H  . feeding supplement  237 mL Oral TID BM  . hydrocortisone sodium succinate  50 mg Intravenous Q12H  . insulin aspart  0-20 Units Subcutaneous TID WC  . insulin aspart  0-5 Units Subcutaneous QHS  . insulin aspart  6 Units Subcutaneous TID WC  . insulin glargine  10 Units Subcutaneous Daily  . pantoprazole  40 mg Oral Q1200  . DISCONTD: antiseptic oral rinse  15 mL Mouth Rinse QID  . DISCONTD: chlorhexidine  15 mL Mouth Rinse BID  . DISCONTD: feeding supplement  30 mL Per Tube 5 X Daily  . DISCONTD: fentaNYL  25 mcg Transdermal Q72H  . DISCONTD: heparin subcutaneous  5,000 Units Subcutaneous Q8H  . DISCONTD: hydrocortisone sod succinate (SOLU-CORTEF) injection  50 mg Intravenous Q6H  . DISCONTD: insulin aspart  0-15 Units Subcutaneous TID WC  . DISCONTD: insulin aspart  0-5 Units Subcutaneous QHS  . DISCONTD: sodium chloride  2,000 mL Intravenous Once    Subjective: Feeling better  Objective: Temp:  [96.9 F (36.1 C)-98 F (36.7 C)] 97.8 F (36.6 C) (07/06 1100) Pulse Rate:  [91-114] 107  (07/06 1100) Resp:  [9-25] 18  (07/06 1100) BP: (61-121)/(43-87) 99/52 mmHg (07/06 1100) SpO2:  [90 %-100 %] 100 % (07/06 1100) Weight:   [110.2 kg (242 lb 15.2 oz)] 110.2 kg (242 lb 15.2 oz) (07/06 0500)  General: Alert comfortable and in talking with friends Skin: No splinter or conjunctival hemorrhages Lungs: Clear Cor: Regular S1 and S2 no murmurs Left leg is wrapped  Lab Results Lab Results  Component Value Date   WBC 16.3* 06/19/2012   HGB 8.6* 06/19/2012   HCT 25.7* 06/19/2012   MCV 81.1 06/19/2012   PLT 225 06/19/2012    Lab Results  Component Value Date   CREATININE 1.88* 06/19/2012   BUN 29* 06/19/2012   NA 135 06/19/2012   K 4.4 06/19/2012   CL 101 06/19/2012   CO2 25 06/19/2012    Lab Results  Component Value Date   ALT 30 06/16/2012   AST 35 06/16/2012   ALKPHOS 86 06/16/2012   BILITOT 0.5 06/16/2012      Microbiology: Recent Results (from the past 240 hour(s))  CULTURE, BLOOD (ROUTINE X 2)     Status: Normal   Collection Time   06/14/12  1:20 PM      Component Value Range Status Comment   Specimen Description BLOOD ARM LEFT   Final    Special Requests BOTTLES DRAWN AEROBIC ONLY 10CC   Final    Culture  Setup Time 06/15/2012 02:15   Final    Culture  Final    Value: STAPHYLOCOCCUS AUREUS     Note: SUSCEPTIBILITIES PERFORMED ON PREVIOUS CULTURE WITHIN THE LAST 5 DAYS.     Note: Gram Stain Report Called to,Read Back By and Verified With: BURGUNDY Wimberley 06/15/12 1400 BY SMITHERSJ   Report Status 06/17/2012 FINAL   Final   CULTURE, BLOOD (ROUTINE X 2)     Status: Normal   Collection Time   06/14/12  1:40 PM      Component Value Range Status Comment   Specimen Description BLOOD LEFT HAND   Final    Special Requests BOTTLES DRAWN AEROBIC AND ANAEROBIC 10CC   Final    Culture  Setup Time 06/15/2012 02:15   Final    Culture     Final    Value: STAPHYLOCOCCUS AUREUS     Note: RIFAMPIN AND GENTAMICIN SHOULD NOT BE USED AS SINGLE DRUGS FOR TREATMENT OF STAPH INFECTIONS.     Note: Gram Stain Report Called to,Read Back By and Verified With: BRANDY DIN @ 1814 ON 06/15/12 BY GOLLD   Report Status 06/17/2012 FINAL   Final     Organism ID, Bacteria STAPHYLOCOCCUS AUREUS   Final   URINE CULTURE     Status: Normal   Collection Time   06/14/12  7:15 PM      Component Value Range Status Comment   Specimen Description URINE, RANDOM   Final    Special Requests NONE   Final    Culture  Setup Time 06/15/2012 01:48   Final    Colony Count NO GROWTH   Final    Culture NO GROWTH   Final    Report Status 06/16/2012 FINAL   Final   MRSA PCR SCREENING     Status: Normal   Collection Time   06/14/12  7:17 PM      Component Value Range Status Comment   MRSA by PCR NEGATIVE  NEGATIVE Final   GRAM STAIN     Status: Normal   Collection Time   06/15/12 12:39 PM      Component Value Range Status Comment   Specimen Description WOUND LEFT LOWER LEG   Final    Special Requests NONE   Final    Gram Stain     Final    Value: ABUNDANT WBC PRESENT,BOTH PMN AND MONONUCLEAR     ABUNDANT GRAM POSITIVE COCCI IN PAIRS IN CLUSTERS   Report Status 06/15/2012 FINAL   Final   ANAEROBIC CULTURE     Status: Normal (Preliminary result)   Collection Time   06/15/12 12:39 PM      Component Value Range Status Comment   Specimen Description WOUND LEFT LOWER LEG   Final    Special Requests NONE   Final    Gram Stain     Final    Value: ABUNDANT WBC PRESENT,BOTH PMN AND MONONUCLEAR     ABUNDANT GRAM POSITIVE COCCI IN PAIRS     IN CLUSTERS Performed at [facility]   Culture     Final    Value: NO ANAEROBES ISOLATED; CULTURE IN PROGRESS FOR 5 DAYS   Report Status PENDING   Incomplete   WOUND CULTURE     Status: Normal (Preliminary result)   Collection Time   06/15/12 12:39 PM      Component Value Range Status Comment   Specimen Description WOUND LEFT LOWER LEG   Final    Special Requests NONE   Final    Gram Stain PENDING   Incomplete  Culture     Final    Value: ABUNDANT STAPHYLOCOCCUS AUREUS     Note: RIFAMPIN AND GENTAMICIN SHOULD NOT BE USED AS SINGLE DRUGS FOR TREATMENT OF STAPH INFECTIONS.   Report Status PENDING   Incomplete     Organism ID, Bacteria STAPHYLOCOCCUS AUREUS   Final    Assessment: Resolving MSSA bacteremia and sepsis due to a left calf abscess.  Plan: 1. Continue cefazolin for 4 weeks total 2. I will followup on Monday, July 8  Cliffton Asters, MD [facility] [phone] pager   [phone] cell 06/19/2012, 12:16 PM

## 2012-06-19 NOTE — Progress Notes (Signed)
Assessment and Plan:  1. Severe sepsis, Staph Aureus. Continue antibiotics  2. Oliguric ARF-becoming nonoliguric.  Stop CVVHD  3. Cellulitis/fasciitis left popliteal-s/p drainage per Dr. Lajoyce Corners  4. NSTEMI -per cardiology  5. S/p VDRF   Subjective: Interval History: [name]/O leg pain  Objective: Vital signs in last 24 hours: Temp:  [96.9 F (36.1 [name])-97.8 F (36.6 [name])] 97.6 F (36.4 [name]) (07/06 0715) Pulse Rate:  [91-114] 100  (07/06 0715) Resp:  [9-25] 15  (07/06 0715) BP: (61-116)/(31-87) 104/55 mmHg (07/06 0715) SpO2:  [90 %-100 %] 100 % (07/06 0715) Weight:  [110.2 kg (242 lb 15.2 oz)] 110.2 kg (242 lb 15.2 oz) (07/06 0500) Weight change: -2.7 kg (-5 lb 15.2 oz)  Intake/Output from previous day: 07/05 0701 - 07/06 0700 In: 1881.7 [P.O.:580; I.V.:569.2; Blood:457.5; NG/GT:175; IV Piggyback:100] Out: 2314 [Urine:177; Stool:425] Intake/Output this shift: Total I/O In: 200 [P.O.:200] Out: 533 [Other:533]  General appearance: alert, cooperative and appears stated age Resp: clear to auscultation bilaterally Chest wall: no tenderness GI: soft, non-tender; bowel sounds normal; no masses,  no organomegaly Extremities: edema 2-3+rle  Lab Results:  Basename 06/19/12 0500 06/18/12 2300 06/18/12 0500  WBC 16.3* -- 16.7*  HGB 8.6* 6.8* --  HCT 25.7* 20.7* --  PLT 225 -- 225   BMET:  Basename 06/19/12 0500 06/18/12 1600  NA 135 136  K 4.4 3.9  CL 101 102  CO2 25 26  GLUCOSE 246* 240*  BUN 29* 29*  CREATININE 1.88* 1.86*  CALCIUM 7.8* 7.4*   No results found for this basename: PTH:2 in the last 72 hours Iron Studies: No results found for this basename: IRON,TIBC,TRANSFERRIN,FERRITIN in the last 72 hours Studies/Results: Dg Chest Port 1 View  06/19/2012  *RADIOLOGY REPORT*  Clinical Data: Left lower lobe pneumonia  PORTABLE CHEST - 1 VIEW  Comparison: 06/18/2012  Findings: Right IJ catheter tip is in the SVC.  There is a left IJ catheter with tip in the SVC.  The heart size  appears enlarged.  No pleural effusion or edema.  Atelectasis is again noted in the left base.  IMPRESSION:  1.  Interval removal of ET tube. 2.  Persistent left base atelectasis.  Original Report Authenticated By: Rosealee Albee, M.D.   Dg Chest Port 1 View  06/18/2012  *RADIOLOGY REPORT*  Clinical Data: Ventilator support.  Hypoxia.  PORTABLE CHEST - 1 VIEW  Comparison: 06/17/2012  Findings: Endotracheal tube has its tip 2.5 cm above the carina. Nasogastric tube enters the stomach.  Left internal jugular catheter has its tip in the SVC at the azygos level.  Right internal jugular central line has its tip in the SVC at the azygos level.  No pneumothorax.  There is volume loss in the lower lobes left worse than right.  IMPRESSION: Lines and tubes well positioned.  Persistent lower lobe volume loss left worse than right.  Original Report Authenticated By: Thomasenia Sales, M.D.   Dg Chest Port 1 View  06/17/2012  *RADIOLOGY REPORT*  Clinical Data: Left IJ hemodialysis catheter exchange.  Rule out pneumothorax.  PORTABLE CHEST - 1 VIEW  Comparison: 06/17/2012  Findings: The left internal jugular dialysis catheter is in place. The tip is directed laterally into the wall of the upper SVC.  No pneumothorax.  Endotracheal tube, NG tube and right central line are unchanged.  There is cardiomegaly with vascular congestion.  Bibasilar atelectasis, similar to prior study.  No definite effusions.  IMPRESSION: Left internal jugular dialysis catheter tip is directed transversely  into the wall of the upper superior vena cava.  No pneumothorax.  Cardiomegaly, vascular congestion and bibasilar atelectasis, stable.  Original Report Authenticated By: Cyndie Chime, M.D.    Scheduled:   . aspirin  325 mg Per Tube Daily  .  ceFAZolin (ANCEF) IV  2 g Intravenous Q12H  . feeding supplement  237 mL Oral TID BM  . insulin aspart  0-20 Units Subcutaneous TID WC  . insulin aspart  0-5 Units Subcutaneous QHS  . insulin aspart   6 Units Subcutaneous TID WC  . insulin glargine  10 Units Subcutaneous Daily  . pantoprazole  40 mg Oral Q1200  . DISCONTD: antiseptic oral rinse  15 mL Mouth Rinse QID  . DISCONTD: chlorhexidine  15 mL Mouth Rinse BID  . DISCONTD: feeding supplement  30 mL Per Tube 5 X Daily  . DISCONTD: fentaNYL  25 mcg Transdermal Q72H  . DISCONTD: heparin subcutaneous  5,000 Units Subcutaneous Q8H  . DISCONTD: hydrocortisone sod succinate (SOLU-CORTEF) injection  50 mg Intravenous Q6H  . DISCONTD: insulin aspart  0-15 Units Subcutaneous TID WC  . DISCONTD: insulin aspart  0-5 Units Subcutaneous QHS  . DISCONTD: sodium chloride  2,000 mL Intravenous Once    LOS: 5 days   Wayne [name] 06/19/2012,10:56 AM

## 2012-06-19 NOTE — Progress Notes (Signed)
Continues to have pain in the calf; dressing removed and leg evaluated; looks similar with no new signs of worsening infection or bleed. Pulses present per bedside nurse Started oxycodone for pain control.   Hypotension; cbc, cmp sent; improved with a bolus of 500 cc fluid.

## 2012-06-19 NOTE — Progress Notes (Signed)
Dressing ok on left leg Draining continues Med mngmt in progress

## 2012-06-20 DIAGNOSIS — J189 Pneumonia, unspecified organism: Secondary | ICD-10-CM

## 2012-06-20 LAB — TYPE AND SCREEN
ABO/RH(D): O POS
Unit division: 0

## 2012-06-20 LAB — RENAL FUNCTION PANEL
BUN: 37 mg/dL — ABNORMAL HIGH (ref 6–23)
Chloride: 101 mEq/L (ref 96–112)
Glucose, Bld: 166 mg/dL — ABNORMAL HIGH (ref 70–99)
Potassium: 4.1 mEq/L (ref 3.5–5.1)

## 2012-06-20 LAB — ANAEROBIC CULTURE

## 2012-06-20 LAB — GLUCOSE, CAPILLARY: Glucose-Capillary: 194 mg/dL — ABNORMAL HIGH (ref 70–99)

## 2012-06-20 LAB — CBC
HCT: 26.8 % — ABNORMAL LOW (ref 39.0–52.0)
Hemoglobin: 9 g/dL — ABNORMAL LOW (ref 13.0–17.0)
WBC: 10.5 10*3/uL (ref 4.0–10.5)

## 2012-06-20 MED ORDER — FUROSEMIDE 10 MG/ML IJ SOLN
100.0000 mg | Freq: Once | INTRAVENOUS | Status: AC
  Administered 2012-06-20: 100 mg via INTRAVENOUS
  Filled 2012-06-20: qty 10

## 2012-06-20 MED ORDER — MORPHINE SULFATE 2 MG/ML IJ SOLN
2.0000 mg | INTRAMUSCULAR | Status: DC | PRN
  Administered 2012-06-20 (×4): 2 mg via INTRAVENOUS
  Filled 2012-06-20 (×3): qty 1

## 2012-06-20 MED ORDER — AMIODARONE HCL IN DEXTROSE 360-4.14 MG/200ML-% IV SOLN: 60.0000 mg/h | INTRAVENOUS | Status: DC

## 2012-06-20 MED ORDER — ACETAMINOPHEN 325 MG PO TABS
650.0000 mg | ORAL_TABLET | ORAL | Status: DC | PRN
  Administered 2012-06-20 – 2012-06-23 (×6): 650 mg via ORAL
  Filled 2012-06-20 (×3): qty 2
  Filled 2012-06-20: qty 1
  Filled 2012-06-20: qty 2
  Filled 2012-06-20: qty 1
  Filled 2012-06-20: qty 2

## 2012-06-20 MED ORDER — MORPHINE SULFATE 2 MG/ML IJ SOLN
INTRAMUSCULAR | Status: AC
  Filled 2012-06-20: qty 1

## 2012-06-20 MED ORDER — AMIODARONE LOAD VIA INFUSION
150.0000 mg | Freq: Once | INTRAVENOUS | Status: DC
  Filled 2012-06-20: qty 83.34

## 2012-06-20 MED ORDER — AMIODARONE HCL IN DEXTROSE 360-4.14 MG/200ML-% IV SOLN: 30.0000 mg/h | INTRAVENOUS | Status: DC

## 2012-06-20 MED ORDER — HEPARIN SODIUM (PORCINE) 5000 UNIT/ML IJ SOLN
5000.0000 [IU] | Freq: Three times a day (TID) | INTRAMUSCULAR | Status: DC
  Administered 2012-06-20 – 2012-07-02 (×35): 5000 [IU] via SUBCUTANEOUS
  Filled 2012-06-20 (×39): qty 1

## 2012-06-20 MED ORDER — ACETAMINOPHEN 160 MG/5ML PO SOLN: 650.0000 mg | ORAL | Status: DC | PRN

## 2012-06-20 NOTE — Progress Notes (Signed)
Assessment and Plan:  1. Severe sepsis, Staph Aureus. Continue antibiotics  2. Oliguric ARF-now nonoliguric & evidence of volume overload.   3. Cellulitis/fasciitis left popliteal-s/p drainage per Dr. Lajoyce Corners  4. NSTEMI -per cardiology  5. S/p VDRF  Plan:  1 Will decide about need for HD catheter or removal in a day or two. 2  Lasix X 1 IV today  Subjective: Interval History: c/o leg pain   Objective: Vital signs in last 24 hours: Temp:  [97.4 F (36.3 C)-98.6 F (37 C)] 97.5 F (36.4 C) (07/07 1000) Pulse Rate:  [91-111] 98  (07/07 1000) Resp:  [12-22] 18  (07/07 1000) BP: (74-142)/(36-83) 108/57 mmHg (07/07 1000) SpO2:  [91 %-100 %] 97 % (07/07 1000) Weight:  [111.6 kg (246 lb 0.5 oz)] 111.6 kg (246 lb 0.5 oz) (07/07 0700) Weight change: 1.4 kg (3 lb 1.4 oz)  Intake/Output from previous day: 07/06 0701 - 07/07 0700 In: 1817.6 [P.O.:440; I.V.:477.6; Blood:300; IV Piggyback:100] Out: 1366 [Urine:738] Intake/Output this shift: Total I/O In: 460 [P.O.:360; I.V.:50; IV Piggyback:50] Out: 125 [Urine:125]  General appearance: alert, cooperative and appears stated age Resp: diminished breath sounds bibasilar Chest wall: no tenderness Cardio: regular rate and rhythm, S1, S2 normal, no murmur, click, rub or gallop 1-2+ pitting edema L IJ catheter  Lab Results:  Basename 06/20/12 0445 06/19/12 2025  WBC 10.5 10.8*  HGB 9.0* 8.7*  HCT 26.8* 26.1*  PLT 238 215   BMET:  Basename 06/20/12 0445 06/19/12 2025  NA 135 134*  K 4.1 3.7  CL 101 101  CO2 24 25  GLUCOSE 166* 169*  BUN 37* 34*  CREATININE 2.73* 2.36*  CALCIUM 7.7* 7.6*   No results found for this basename: PTH:2 in the last 72 hours Iron Studies: No results found for this basename: IRON,TIBC,TRANSFERRIN,FERRITIN in the last 72 hours Studies/Results: Dg Chest Port 1 View  06/19/2012  *RADIOLOGY REPORT*  Clinical Data: Left lower lobe pneumonia  PORTABLE CHEST - 1 VIEW  Comparison: 06/18/2012  Findings: Right  IJ catheter tip is in the SVC.  There is a left IJ catheter with tip in the SVC.  The heart size appears enlarged.  No pleural effusion or edema.  Atelectasis is again noted in the left base.  IMPRESSION:  1.  Interval removal of ET tube. 2.  Persistent left base atelectasis.  Original Report Authenticated By: Rosealee Albee, M.D.   Scheduled:   . aspirin  325 mg Per Tube Daily  .  ceFAZolin (ANCEF) IV  2 g Intravenous Q12H  . feeding supplement  237 mL Oral TID BM  . fentaNYL  50 mcg Intravenous Once  . hydrocortisone sodium succinate  50 mg Intravenous Q12H  . insulin aspart  0-20 Units Subcutaneous TID WC  . insulin aspart  0-5 Units Subcutaneous QHS  . insulin aspart  6 Units Subcutaneous TID WC  . insulin glargine  10 Units Subcutaneous Daily  . morphine      . pantoprazole  40 mg Oral Q1200  . sodium chloride  500 mL Intravenous Once     LOS: 6 days   Lekia Nier C 06/20/2012,11:02 AM

## 2012-06-20 NOTE — Progress Notes (Signed)
eLink Physician-Brief Progress Note Patient Name: Wayne [name] DOB: 1941-07-23 MRN: [id number]  Date of Service  06/20/2012   HPI/Events of Note   Pain failing fent  eICU Interventions  Add morhine      Nelda Bucks. 06/20/2012, 12:26 AM

## 2012-06-20 NOTE — Progress Notes (Signed)
Patient has noticed new scrotal swelling. Area is not red or painful. Will continue to monitor. Wayne [name]

## 2012-06-20 NOTE — Progress Notes (Signed)
Name: Wayne [name] MRN: [id number] DOB: [birth year]    LOS: 6 Date of admit 06/14/2012 12:48 PM PCP is Kristian Covey, MD Ortho is Dr Jonathon Bellows  Referring Provider:  Dr Preston Fleeting of ER Reason for Referral:  Sepsis, hypotension, acidsis , hyperglycemia  PULMONARY / CRITICAL CARE MEDICINE  71 yr old septic shock, resp failure, leg source. S/p irrigation and debridement of LE on 7/2  Events Since Admission: 7/2- refractory shock post op, acidosis, arf, epi required 7/2 irrigation and debridement of LLE 7/2 2 units PRBC 7/3- Improved pressors 7/5- Off all pressors 7/6 2 units PRBC 7/7 >> SDU  Overnight: Hgb stable, comfortable  Current Status: Comfortable, conversant  Lines/Tubes: ETT 7/1>>7/5 NG Tube 7/1 >>7/5 Foley 7/1>>> RIJ 7/1>>> R Radial 7/1>>>7/4 RPIV 7/1>>> LPIV 7/1>>> Premrose Drains7/2>>>7/5  ABX: Clement Husbands 7/1 >>> 7/2 Zosyn 7/1 >>> 7/2 7/2 clinda>>>7/3 Linezolid 7/2>>>7/5 Imipenim 7/2>>>7/5 Cefazolin 7/5 >>>  Vital Signs: Temp:  [97.4 F (36.3 C)-98.6 F (37 C)] 97.5 F (36.4 C) (07/07 1000) Pulse Rate:  [91-109] 98  (07/07 1000) Resp:  [12-22] 18  (07/07 1000) BP: (74-142)/(44-83) 108/57 mmHg (07/07 1000) SpO2:  [91 %-99 %] 97 % (07/07 1000) Weight:  [111.6 kg (246 lb 0.5 oz)] 111.6 kg (246 lb 0.5 oz) (07/07 0700)  Physical Examination: General: NAD HEENT: NCAT, EOMi, HD cath in place PULM: CTA B CV: RRR, no mgr AB: BS+, soft, nontender Ext: edema, dressing in place, no oozing Neuro: A&Ox4  Principal Problem:  *Staphylococcus aureus bacteremia with sepsis Active Problems:  HYPERTROPHY PROSTATE W/UR OBST & OTH LUTS  CORONARY ATHEROSCLEROSIS NATIVE CORONARY ARTERY  Polymyalgia rheumatica  Systolic CHF, chronic  OSA (obstructive sleep apnea)  Obesity  Metabolic acidosis  Cellulitis and abscess of lower leg  Acute respiratory failure  Healthcare-associated pneumonia  Hyperglycemia  Acute encephalopathy  NSTEMI, initial episode of  care   ASSESSMENT AND PLAN  PULMONARY  Lab 06/18/12 0330 06/17/12 0445 06/16/12 0544 06/15/12 1737 06/15/12 1720 06/15/12 1539  PHART 7.475* 7.496* 7.416 7.260* -- 7.255*  PCO2ART 32.5* 29.6* 28.3* 28.8* -- 34.5*  PO2ART 99.4 158.0* 125.0* 125.0* -- 99.8  HCO3 23.9 22.8 18.0* 12.9* -- 14.8*  O2SAT 99.6 100.0 99.0 98.0 76.5 --   Ventilator Settings: Extubated on RA  CXR:  Improved aeration. Official read pending  ETT: 7/1>>>  A:  Acute Resp Distress due to metabolic acidosis +/- LLL PNA,  r/o ards, not impressed overload. Resolving. Doing well since extubation 7/5. No supplemental O2 requirement P:   - Supplemental O2 PRN  - CXR if deteriorates    CARDIOVASCULAR  Lab 06/15/12 0950 06/14/12 2350 06/14/12 1818 06/14/12 1635 06/14/12 1634  TROPONINI 7.90* 5.17* -- -- 3.49*  LATICACIDVEN -- -- -- 3.9* --  PROBNP -- -- 24481.0* -- --    Lab 06/20/12 0445 06/19/12 2025 06/19/12 1100 06/19/12 0500  WBC 10.5 10.8* -- 16.3*  HGB 9.0* 8.7* 8.8* --  HCT 26.8* 26.1* 26.7* --  PLT 238 215 -- 225    ECG: no St changes Lines:  RIJ 7/1>>> R Radial 7/1>>>7/4   A: Severe sepsis and hypotension - Pt EF of 45%. Echo ordered as inpt showing EF 15%, diffuse hypo. 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 Repeat as outpt. BP improved w/ multiple pressors and significant fluid boluses. Now off all pressors. Hepain w/ HD. Troponins were elevated again on 7/2, likely from cardiac strain. No CP. P:   Asa D/C Stress steroids  CBC in am  RENAL  Lab 06/20/12 0445 06/19/12 2025 06/19/12 0500 06/18/12 1600 06/18/12 0500 06/17/12 1721 06/17/12 0440 06/16/12 1300  NA 135 134* 135 136 136 -- -- --  K 4.1 3.7 -- -- -- -- -- --  CL 101 101 101 102 103 -- -- --  CO2 24 25 25 26 23  -- -- --  BUN 37* 34* 29* 29* 31* -- -- --  CREATININE 2.73* 2.36* 1.88* 1.86* 2.15* -- -- --  CALCIUM 7.7* 7.6* 7.8* 7.4* 7.6* -- -- --  MG 2.3 -- 2.4 -- 2.2 -- 2.2 2.0  PHOS 3.6 -- 3.2 2.3 3.0 3.1 -- --    Intake/Output      07/06 0701 - 07/07 0700 07/07 0701 - 07/08 0700   P.O. 440 360   I.V. (mL/kg) 477.6 (4.3) 50 (0.4)   Blood 300    Other 500    NG/GT     IV Piggyback 100 50   Total Intake(mL/kg) 1817.6 (16.3) 460 (4.1)   Urine (mL/kg/hr) 738 (0.3) 125 (0.2)   Other 628    Stool     Total Output 1366 125   Net +451.6 +335         Foley: not placed at admit 06/14/2012 12:48 PM   A:  Severe metabolic acidosis, suspect due to sepsis lactic acid. AKI from septic shock, cvvhd held 7/6. Lasix 7/7 P:   cvvhd per renal Lasix today per renal Monitor i/o  bmet in am  GASTROINTESTINAL  Lab 06/20/12 0445 06/19/12 2025 06/19/12 0500 06/18/12 1600 06/18/12 0500 06/16/12 1700 06/16/12 0500 06/14/12 1817 06/14/12 1307  AST -- 25 -- -- -- 35 58* 43* 36  ALT -- 9 -- -- -- 30 38 23 27  ALKPHOS -- 75 -- -- -- 86 67 97 83  BILITOT -- 0.3 -- -- -- 0.5 0.8 0.3 0.5  PROT -- 4.6* -- -- -- 4.7* 4.7* 5.5* 6.9  ALBUMIN 1.4* 1.5* 1.6* 1.4* 1.4* -- -- -- --    A:  Tolerating PO P:   ppi - advance diet  HEMATOLOGIC  Lab 06/20/12 0445 06/19/12 2025 06/19/12 1100 06/19/12 0500 06/19/12 0200 06/18/12 2300 06/18/12 0500 06/17/12 0440 06/14/12 1817  HGB 9.0* 8.7* 8.8* 8.6* -- 6.8* -- -- --  HCT 26.8* 26.1* 26.7* 25.7* -- 20.7* -- -- --  PLT 238 215 -- 225 -- -- 225 253 --  INR -- -- -- -- 1.13 -- -- -- 1.55*  APTT -- -- -- -- >200* -- 86* -- 38*   A:  Received 2 u nits PRBC overnight related to bleeding from dressing while on CVVHD.  P:  - cbc in am - monitor dressing oozing -   INFECTIOUS  Lab 06/20/12 0445 06/19/12 2025 06/19/12 0500 06/18/12 0500 06/17/12 0440 06/14/12 1634  WBC 10.5 10.8* 16.3* 16.7* 20.9* --  PROCALCITON -- -- -- -- -- 7.60   Cultures: 7/1 BCX: >>> SA pansens 7/1 BCX:>>> SA 7/1 UCX >>>  7/2 Wound CX: >>> SA  ABX:  Vanc 7/1 >>> 7/2 Zosyn 7/1 >>> 7/2 7/2 clinda>>> 7/3 Linezolid 7/2>>>7/5 Imipenim 7/2>>>7/5 Ancef 7/5>>>  A:  Bacteremia and Sepsis w/ SA  that is pan sensitive. Cellulitis LLE w/ Abscess, s/p irriagation and debridement POD3. Now afebrile and normal HR. Likely toxic shock appearance from staph.  P:   Continue ancef ID consult, TEE. Length of therpay  ENDOCRINE  Lab 06/20/12 1134 06/20/12 0730 06/19/12 2124 06/19/12 1554 06/19/12 1125  GLUCAP 194* 178* 158* 85 274*  A:  Hyperglycemia likely reaction to sepsis and not DKA . Known diabetic on oral agents. Cortisol is off so will likely improve. Now on lantus and resistent SSI P:   No oral agents in hospital Continue SSI and Lantus   NEUROLOGIC  A:  Confused at admit. Normalized with fluids and bp increase. Fentanyl Iv and lidoderm patch P:   WUA Cont fent.    BEST PRACTICE / DISPOSITION Level of Care:  icu Primary Service:  pccm Consultants:  Ortho Dr Lajoyce Corners  Code Status:  Full  Diet:  TF 7/3 DVT Px:  SCD, Hep GI Px:  ppi Skin Integrity:  Intact other than LLE Social / Family: wife updated on 7/5  SDU on 7/8  Yolonda Kida PCCM Pager: [phone] Cell: [phone] If no response, call [phone]

## 2012-06-21 ENCOUNTER — Inpatient Hospital Stay (HOSPITAL_COMMUNITY): Payer: Medicare Other

## 2012-06-21 DIAGNOSIS — G4733 Obstructive sleep apnea (adult) (pediatric): Secondary | ICD-10-CM

## 2012-06-21 DIAGNOSIS — E118 Type 2 diabetes mellitus with unspecified complications: Secondary | ICD-10-CM

## 2012-06-21 LAB — BASIC METABOLIC PANEL
BUN: 47 mg/dL — ABNORMAL HIGH (ref 6–23)
Chloride: 101 mEq/L (ref 96–112)
Creatinine, Ser: 3.92 mg/dL — ABNORMAL HIGH (ref 0.50–1.35)
GFR calc Af Amer: 17 mL/min — ABNORMAL LOW (ref >90)
Glucose, Bld: 153 mg/dL — ABNORMAL HIGH (ref 70–99)

## 2012-06-21 LAB — GLUCOSE, CAPILLARY
Glucose-Capillary: 83 mg/dL (ref 70–99)
Glucose-Capillary: 169 mg/dL — ABNORMAL HIGH (ref 70–99)

## 2012-06-21 LAB — BLOOD GAS, ARTERIAL
Bicarbonate: 22.9 mEq/L (ref 20.0–24.0)
FIO2: 0.21 %
O2 Saturation: 97 %
Patient temperature: 97.8

## 2012-06-21 LAB — CBC
MCH: 27.1 pg (ref 26.0–34.0)
MCHC: 32.7 g/dL (ref 30.0–36.0)
Platelets: 269 10*3/uL (ref 150–400)

## 2012-06-21 MED ORDER — SENNOSIDES-DOCUSATE SODIUM 8.6-50 MG PO TABS
1.0000 | ORAL_TABLET | Freq: Every day | ORAL | Status: DC
  Administered 2012-06-21: 1 via ORAL
  Filled 2012-06-21 (×4): qty 1

## 2012-06-21 MED ORDER — SILVER SULFADIAZINE 1 % EX CREA
TOPICAL_CREAM | Freq: Every day | CUTANEOUS | Status: DC
  Administered 2012-06-21 – 2012-06-29 (×7): via TOPICAL
  Filled 2012-06-21 (×3): qty 85

## 2012-06-21 MED ORDER — FUROSEMIDE 80 MG PO TABS
80.0000 mg | ORAL_TABLET | Freq: Three times a day (TID) | ORAL | Status: DC
  Filled 2012-06-21 (×3): qty 1

## 2012-06-21 MED ORDER — CEFAZOLIN SODIUM 1-5 GM-% IV SOLN
1.0000 g | Freq: Two times a day (BID) | INTRAVENOUS | Status: DC
  Administered 2012-06-21 – 2012-06-22 (×3): 1 g via INTRAVENOUS
  Filled 2012-06-21 (×5): qty 50

## 2012-06-21 MED ORDER — CARVEDILOL 3.125 MG PO TABS
3.1250 mg | ORAL_TABLET | Freq: Two times a day (BID) | ORAL | Status: DC
  Administered 2012-06-21: 3.125 mg via ORAL
  Filled 2012-06-21 (×3): qty 1

## 2012-06-21 MED ORDER — POLYETHYLENE GLYCOL 3350 17 G PO PACK
17.0000 g | PACK | Freq: Every day | ORAL | Status: DC
  Administered 2012-06-21 – 2012-06-25 (×3): 17 g via ORAL
  Filled 2012-06-21 (×6): qty 1

## 2012-06-21 NOTE — Progress Notes (Signed)
Patient ID: Wayne [name], male   DOB: 1941-06-10, 71 y.o.   MRN: [id number]    [facility]    Date of Admission:  06/14/2012   Total days of antibiotics 8         Principal Problem:  *Staphylococcus aureus bacteremia with sepsis Active Problems:  Cellulitis and abscess of lower leg  HYPERTROPHY PROSTATE W/UR OBST & OTH LUTS  CORONARY ATHEROSCLEROSIS NATIVE CORONARY ARTERY  Polymyalgia rheumatica  Systolic CHF, chronic  OSA (obstructive sleep apnea)  Obesity  Metabolic acidosis  Acute respiratory failure  Healthcare-associated pneumonia  Hyperglycemia  Acute encephalopathy  NSTEMI, initial episode of care      .  ceFAZolin (ANCEF) IV  1 g Intravenous Q12H  . feeding supplement  237 mL Oral TID BM  . heparin subcutaneous  5,000 Units Subcutaneous Q8H  . insulin aspart  0-20 Units Subcutaneous TID WC  . insulin aspart  0-5 Units Subcutaneous QHS  . insulin aspart  6 Units Subcutaneous TID WC  . insulin glargine  10 Units Subcutaneous Daily  . pantoprazole  40 mg Oral Q1200  . polyethylene glycol  17 g Oral Daily  . senna-docusate  1 tablet Oral QHS  . silver sulfADIAZINE   Topical Daily  . DISCONTD: aspirin  325 mg Per Tube Daily  . DISCONTD: carvedilol  3.125 mg Oral BID WC  . DISCONTD:  ceFAZolin (ANCEF) IV  2 g Intravenous Q12H  . DISCONTD: furosemide  80 mg Oral TID    Subjective: He is feeling better  Objective: Temp:  [97.4 F (36.3 C)-98.3 F (36.8 C)] 97.4 F (36.3 C) (07/08 1327) Pulse Rate:  [87-110] 92  (07/08 1327) Resp:  [15-21] 15  (07/08 1327) BP: (101-130)/(47-65) 111/65 mmHg (07/08 1327) SpO2:  [87 %-97 %] 97 % (07/08 1327)  General: He is alert and appears comfortable talking with family. His left leg is wrapped.  Assessment: He is improving on therapy for MSSA bacteremia and a severe deep abscess of his left leg.  Plan: 1. Recommend continuing cefazolin for 3 more weeks 2. I will followup on July 10  Cliffton Asters, MD [facility] [phone] pager   [phone] cell 06/21/2012, 1:58 PM

## 2012-06-21 NOTE — Progress Notes (Signed)
Name: Wayne [name] MRN: [id number] DOB: 1941-07-17    LOS: 7 Date of admit 06/14/2012 12:48 PM PCP is Kristian Covey, MD Ortho is Dr Jonathon Bellows  Referring Provider:  Dr Preston Fleeting of ER Reason for Referral:  Sepsis, hypotension, acidsis , hyperglycemia  PULMONARY / CRITICAL CARE MEDICINE  71 yr old septic shock, resp failure, leg source. S/p irrigation and debridement of LE on 7/2  Events Since Admission: 7/2- refractory shock post op, acidosis, arf, epi required 7/2 irrigation and debridement of LLE 7/2 2 units PRBC 7/3- Improved pressors 7/5- Off all pressors 7/6 2 units PRBC 7/8>>> SDU  Overnight: NAEO, pain well controlled, Hgb stable, comfortable  Current Status: Comfortable, conversant  Lines/Tubes: ETT 7/1>>7/5 NG Tube 7/1 >>7/5 Foley 7/1>>> RIJ 7/1>>> R Radial 7/1>>>7/4 RPIV 7/1>>>7/4 LPIV 7/1>>> Premrose Drains7/2>>>7/5  ABX: Clement Husbands 7/1 >>> 7/2 Zosyn 7/1 >>> 7/2 clinda7/2>>>7/3 Linezolid 7/2>>>7/5 Imipenim 7/2>>>7/5 Cefazolin 7/5 >>>  Vital Signs: Temp:  [97.4 F (36.3 C)-98.3 F (36.8 C)] 97.7 F (36.5 C) (07/08 0600) Pulse Rate:  [87-108] 94  (07/08 0600) Resp:  [16-21] 17  (07/08 0600) BP: (92-128)/(47-65) 102/57 mmHg (07/08 0600) SpO2:  [87 %-98 %] 96 % (07/08 0600) Weight:  [246 lb 0.5 oz (111.6 kg)] 246 lb 0.5 oz (111.6 kg) (07/07 0700)  Physical Examination: General: NAD HEENT: NCAT, EOMi, HD cath in place PULM: CTA B CV: RRR, no mgr AB: BS+, soft, nontender Ext: edema, LLE dressings down, large incision running length of leg w/o active bleeding, excoriations and echymosis in various stages of healing. No purulent DC  Neuro: A&Ox4  Principal Problem:  *Staphylococcus aureus bacteremia with sepsis Active Problems:  HYPERTROPHY PROSTATE W/UR OBST & OTH LUTS  CORONARY ATHEROSCLEROSIS NATIVE CORONARY ARTERY  Polymyalgia rheumatica  Systolic CHF, chronic  OSA (obstructive sleep apnea)  Obesity  Metabolic acidosis  Cellulitis and abscess of  lower leg  Acute respiratory failure  Healthcare-associated pneumonia  Hyperglycemia  Acute encephalopathy  NSTEMI, initial episode of care   ASSESSMENT AND PLAN  PULMONARY  Lab 06/21/12 0510 06/18/12 0330 06/17/12 0445 06/16/12 0544 06/15/12 1737  PHART 7.467* 7.475* 7.496* 7.416 7.260*  PCO2ART 31.9* 32.5* 29.6* 28.3* 28.8*  PO2ART 65.4* 99.4 158.0* 125.0* 125.0*  HCO3 22.9 23.9 22.8 18.0* 12.9*  O2SAT 97.0 99.6 100.0 99.0 98.0   Ventilator Settings: Extubated on RA  ETT: 7/1>>>7/5  A:  Acute Resp Distress due to metabolic acidosis +/- LLL PNA,  r/o ards, not impressed overload. Resolved Doing well since extubation 7/5. No supplemental O2 requirement. Pt w/ h/o OSA.  P:   - Supplemental O2 PRN  - CXR if deteriorates - will need to determine whether he uses CPAP, order if so. Outpt PSG if none done in the past   CARDIOVASCULAR  Lab 06/15/12 0950 06/14/12 2350 06/14/12 1818 06/14/12 1635 06/14/12 1634  TROPONINI 7.90* 5.17* -- -- 3.49*  LATICACIDVEN -- -- -- 3.9* --  PROBNP -- -- 24481.0* -- --    Lab 06/21/12 0500 06/20/12 0445 06/19/12 2025  WBC 10.0 10.5 10.8*  HGB 8.9* 9.0* 8.7*  HCT 27.2* 26.8* 26.1*  PLT 269 238 215    ECG: no St changes Lines:  RIJ 7/1>>> LPIV 7/1>>>   A: Severe sepsis and hypotension - Pt EF of 45% 2009. Echo ordered as inpt showing EF 15%, diffuse hypo. 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 Repeat as outpt. BP improved w/ multiple pressors and significant fluid boluses. Now off all pressors. Heparin w/ HD. Off  stress steroids. Troponins were elevated again on 7/2, likely from cardiac strain. No CP. P:  Asa Monitor BPs CBC in am Plan volume removal per renal Coreg held on 7/8 ID following, no indication at this time for TEE  RENAL  Lab 06/21/12 0500 06/20/12 0445 06/19/12 2025 06/19/12 0500 06/18/12 1600 06/18/12 0500 06/17/12 1721 06/17/12 0440 06/16/12 1300  NA 136 135 134* 135 136 -- -- -- --  K 3.6 4.1 -- -- -- -- --  -- --  CL 101 101 101 101 102 -- -- -- --  CO2 24 24 25 25 26  -- -- -- --  BUN 47* 37* 34* 29* 29* -- -- -- --  CREATININE 3.92* 2.73* 2.36* 1.88* 1.86* -- -- -- --  CALCIUM 8.0* 7.7* 7.6* 7.8* 7.4* -- -- -- --  MG -- 2.3 -- 2.4 -- 2.2 -- 2.2 2.0  PHOS -- 3.6 -- 3.2 2.3 3.0 3.1 -- --   Intake/Output      07/07 0701 - 07/08 0700   P.O. 360   I.V. (mL/kg) 410 (3.7)   IV Piggyback 210   Total Intake(mL/kg) 980 (8.8)   Urine (mL/kg/hr) 1875 (0.7)   Total Output 1875   Net -895        Foley: 7/1>>>  A:  Severe metabolic acidosis on presentation, suspect due to sepsis lactic acid. AKI from septic shock, cvvhd held 7/6. Lasix 7/7. Cr bump today likely from lasix and being off CCVHD, continued poor GFR. UOP of 1875 in past 24hrs, improving P:   cvvhd stopped, GFR continues to be poor but good UOP, may require intermittent HD, Renal Service following Monitor i/o  bmet in am  GASTROINTESTINAL  Lab 06/20/12 0445 06/19/12 2025 06/19/12 0500 06/18/12 1600 06/18/12 0500 06/16/12 1700 06/16/12 0500 06/14/12 1817 06/14/12 1307  AST -- 25 -- -- -- 35 58* 43* 36  ALT -- 9 -- -- -- 30 38 23 27  ALKPHOS -- 75 -- -- -- 86 67 97 83  BILITOT -- 0.3 -- -- -- 0.5 0.8 0.3 0.5  PROT -- 4.6* -- -- -- 4.7* 4.7* 5.5* 6.9  ALBUMIN 1.4* 1.5* 1.6* 1.4* 1.4* -- -- -- --    A:  Tolerating PO. Low albumin Tolerating regular diet. Continue protein supplement.  P:   ppi - continue diet  HEMATOLOGIC  Lab 06/21/12 0500 06/20/12 0445 06/19/12 2025 06/19/12 1100 06/19/12 0500 06/19/12 0200 06/18/12 0500 06/14/12 1817  HGB 8.9* 9.0* 8.7* 8.8* 8.6* -- -- --  HCT 27.2* 26.8* 26.1* 26.7* 25.7* -- -- --  PLT 269 238 215 -- 225 -- 225 --  INR -- -- -- -- -- 1.13 -- 1.55*  APTT -- -- -- -- -- >200* 86* 38*   A:  Hgb stable since transfusion.   P:  - cbc in am - monitor dressing oozing today after change   INFECTIOUS  Lab 06/21/12 0500 06/20/12 0445 06/19/12 2025 06/19/12 0500 06/18/12 0500 06/14/12 1634    WBC 10.0 10.5 10.8* 16.3* 16.7* --  PROCALCITON -- -- -- -- -- 7.60   Cultures: 7/1 BCX: >>> SA pansens 7/1 BCX:>>> SA 7/1 UCX >>>  No growth 7/2 Wound CX: >>> MSSA  ABX:  Vanc 7/1 >>> 7/2 Zosyn 7/1 >>> 7/2 7/2 clinda>>> 7/3 Linezolid 7/2>>>7/5 Imipenim 7/2>>>7/5 Ancef 7/5>>>  A:  Bacteremia and Sepsis w/ SA that is pan sensitive. Cellulitis LLE w/ Abscess, s/p irriagation and debridement. Now afebrile and normal HR. Likely toxic shock appearance from  staph. ID recommends 4wks of total ABX. ID not recommending TEE P:   Continue ancef (8 of 28 days)   ENDOCRINE  Lab 06/20/12 2203 06/20/12 1937 06/20/12 1546 06/20/12 1134 06/20/12 0730  GLUCAP 170* 155* 101* 194* 178*   A:  Hyperglycemia likely reaction to sepsis and not DKA . Known diabetic on oral agents. Cortisol is off so will likely improve. Now on lantus and resistent SSI P:   No oral agents in hospital Continue SSI and Lantus   NEUROLOGIC  A:  Confused at admit. Normalized with fluids and bp increase. Fentanyl Iv and lidoderm patch P:   WUA Cont fent, and morphine   BEST PRACTICE / DISPOSITION Level of Care:  icu Primary Service:  pccm Consultants:  Ortho Dr Lajoyce Corners  Code Status:  Full  Diet:  TF 7/3 DVT Px:  SCD, Hep GI Px:  ppi Skin Integrity:  Intact other than LLE Social / Family: wife updated on 7/5  SDU on 7/8  Shelly Flatten, MD Family Medicine PGY-2 06/21/2012, 6:32 AM   Attending Addendum:  I have seen the patient, discussed the issues, test results and plans with Dr Konrad Dolores. I agree with the Assessment and Plans as ammended above.   Levy Pupa, MD, PhD 06/21/2012, 9:36 AM  Pulmonary and Critical Care [phone] or if no answer [phone]

## 2012-06-21 NOTE — Progress Notes (Signed)
Patient ID: Wayne [name], male   DOB: [birth year], 71 y.o.   MRN: [id number]   Patient Name: Wayne [name] Date of Encounter: 06/21/2012  Principal Problem:  *Staphylococcus aureus bacteremia with sepsis Active Problems:  HYPERTROPHY PROSTATE W/UR OBST & OTH LUTS  CORONARY ATHEROSCLEROSIS NATIVE CORONARY ARTERY  Polymyalgia rheumatica  Systolic CHF, chronic  OSA (obstructive sleep apnea)  Obesity  Metabolic acidosis  Cellulitis and abscess of lower leg  Acute respiratory failure  Healthcare-associated pneumonia  Hyperglycemia  Acute encephalopathy  NSTEMI, initial episode of care    SUBJECTIVE: No chest pain, SOB improving. Feels generally better. Less leg pain  OBJECTIVE Filed Vitals:   06/21/12 0400 06/21/12 0500 06/21/12 0600 06/21/12 0700  BP: 118/54 117/59 102/57 122/65  Pulse: 87 90 94 96  Temp: 97.8 F (36.6 C) 97.8 F (36.6 C) 97.7 F (36.5 C) 98 F (36.7 C)  TempSrc:      Resp: 16 18 17 19   Height:      Weight:      SpO2: 87% 96% 96% 96%    Intake/Output Summary (Last 24 hours) at 06/21/12 0756 Last data filed at 06/21/12 0700  Gross per 24 hour  Intake   1000 ml  Output   1875 ml  Net   -875 ml   Weight change:  Filed Weights   06/18/12 0500 06/19/12 0500 06/20/12 0700  Weight: 112.9 kg (248 lb 14.4 oz) 110.2 kg (242 lb 15.2 oz) 111.6 kg (246 lb 0.5 oz)    PHYSICAL EXAM General: Well developed, well nourished, in no acute distress. Head: Normocephalic, atraumatic.  Neck: Supple without bruits, JVD. Lungs:  Resp regular and unlabored, CTA. Heart: RRR, S1, S2, no S3, S4, or murmur. Abdomen: Soft, non-tender, non-distended, BS + x 4.  Extremities: No clubbing, cyanosis, edema.  Neuro: Alert and oriented X 3. Moves all extremities spontaneously. Psych: Normal affect. LLE:  With open wound and drainage  LABS: CBC:  Basename 06/21/12 0500 06/20/12 0445 06/19/12 2025  WBC 10.0 10.5 --  NEUTROABS -- -- 8.3*  HGB 8.9* 9.0* --  HCT 27.2* 26.8* --    MCV 82.7 82.0 --  PLT 269 238 --   INR:  Basename 06/19/12 0200  INR 1.13   Basic Metabolic Panel:  Basename 06/21/12 0500 06/20/12 0445 06/19/12 0500  NA 136 135 --  K 3.6 4.1 --  CL 101 101 --  CO2 24 24 --  GLUCOSE 153* 166* --  BUN 47* 37* --  CREATININE 3.92* 2.73* --  CALCIUM 8.0* 7.7* --  MG -- 2.3 2.4  PHOS -- 3.6 3.2   Liver Function Tests:  Basename 06/20/12 0445 06/19/12 2025  AST -- 25  ALT -- 9  ALKPHOS -- 75  BILITOT -- 0.3  PROT -- 4.6*  ALBUMIN 1.4* 1.5*   Cardiac Enzymes: Lab Results  Component Value Date   CKTOTAL 242* 06/15/2012   CKMB 8.1* 06/15/2012   TROPONINI 7.90* 06/15/2012    BNP: Pro B Natriuretic peptide (BNP)  Date/Time Value Range Status  06/14/2012  6:18 PM 24481.0* 0 - 125 pg/mL Final  03/22/2012  9:12 AM 41.0  0.0 - 100.0 pg/mL Final   2D Echo: 06/16/2012 Study Conclusions - Left ventricle: The cavity size was mildly dilated. Wall thickness was normal. The estimated ejection fraction was 15%. Diffuse hypokinesis. Doppler parameters are consistent with abnormal left ventricular relaxation (grade 1 diastolic dysfunction). - Mitral valve: Mild regurgitation. - Left atrium: The atrium was moderately dilated. -  Pericardium, extracardiac: A small pericardial effusion was identified. Impressions: - Small pericardial effusion with mild RV diastolic collapse.   TELE:  SR/ST with rare PVCs   Radiology/Studies: Dg Chest Port 1 View 06/18/2012  *RADIOLOGY REPORT*  Clinical Data: Ventilator support.  Hypoxia.  PORTABLE CHEST - 1 VIEW  Comparison: 06/17/2012  Findings: Endotracheal tube has its tip 2.5 cm above the carina. Nasogastric tube enters the stomach.  Left internal jugular catheter has its tip in the SVC at the azygos level.  Right internal jugular central line has its tip in the SVC at the azygos level.  No pneumothorax.  There is volume loss in the lower lobes left worse than right.  IMPRESSION: Lines and tubes well positioned.   Persistent lower lobe volume loss left worse than right.  Original Report Authenticated By: Thomasenia Sales, M.D.    Current Medications:  . alteplase  2 mg Intracatheter STAT  . antiseptic oral rinse  15 mL Mouth Rinse QID  . aspirin  325 mg Per Tube Daily  .  ceFAZolin (ANCEF) IV  2 g Intravenous Q12H  . chlorhexidine  15 mL Mouth Rinse BID  . feeding supplement  30 mL Per Tube 5 X Daily  . fentaNYL  25 mcg Transdermal Q72H  . heparin subcutaneous  5,000 Units Subcutaneous Q8H  . hydrocortisone sod succinate (SOLU-CORTEF) injection  50 mg Intravenous Q6H  . insulin aspart  0-15 Units Subcutaneous TID WC  . insulin aspart  0-5 Units Subcutaneous QHS  . insulin glargine  10 Units Subcutaneous Daily  . pantoprazole  40 mg Oral Q1200  . sodium chloride  2,000 mL Intravenous Once      . DISCONTD: amiodarone (NEXTERONE PREMIX) 360 mg/200 mL dextrose    . DISCONTD: amiodarone (NEXTERONE PREMIX) 360 mg/200 mL dextrose      ASSESSMENT AND PLAN: SEMI by enzymes no acute ECG changes. Not a candidate for acute inasive w/u due to elevated Cr sepsis and inability to use  Anticoagulants at full strength.  Try to add low dose beta blocker.  PRN lasix for positive I/O's.  At some point will need to consider ACE if Cr improves or hydralazine/nitrates.    Otherwise, per CCM/Neprhology -  Principal Problem:  *Severe sepsis Active Problems:  HYPERTROPHY PROSTATE W/UR OBST & OTH LUTS  CORONARY ATHEROSCLEROSIS NATIVE CORONARY ARTERY  Polymyalgia rheumatica  Systolic CHF, chronic  OSA (obstructive sleep apnea)  Obesity  Metabolic acidosis  Cellulitis and abscess of lower leg  Acute respiratory failure  Healthcare-associated pneumonia  Hyperglycemia  Acute encephalopathy  Signed, Charlton Haws , PA-C 7:56 AM 06/21/2012  Patient seen with PA, agree with note.  Hope this is septic cardiomyopathy and will recover.  Demand ischemia/sepsis could explain TnI elevation but will need at least  myoview when more stable.  For now, still on low dose norepinephrine and CVVH.  Has had to have transfusions in setting of hematoma.  We are very limited in the cardiac meds that can be used.   Charlton Haws 06/21/2012 7:56 AM

## 2012-06-21 NOTE — Progress Notes (Signed)
Subjective: Interval History: has complaints Leg still sore.  Objective: Vital signs in last 24 hours: Temp:  [97.4 F (36.3 C)-98.3 F (36.8 C)] 98 F (36.7 C) (07/08 0700) Pulse Rate:  [87-108] 96  (07/08 0700) Resp:  [16-21] 19  (07/08 0700) BP: (92-128)/(47-65) 122/65 mmHg (07/08 0700) SpO2:  [87 %-98 %] 96 % (07/08 0700) Weight change:   Intake/Output from previous day: 07/07 0701 - 07/08 0700 In: 1000 [P.O.:360; I.V.:430; IV Piggyback:210] Out: 1875 [Urine:1875] Intake/Output this shift:    General appearance: alert and cooperative Chest wall: no tenderness, LIJ cath Cardio: S1, S2 normal and systolic murmur: holosystolic 2/6, blowing at apex GI: liver down 5 cm. pos bs, mild distension Extremities: edema exoriations, ulcers L calf and reddened., 4 plus edema and as listed Skin: as above  Lab Results:  Basename 06/21/12 0500 06/20/12 0445  WBC 10.0 10.5  HGB 8.9* 9.0*  HCT 27.2* 26.8*  PLT 269 238   BMET:  Basename 06/21/12 0500 06/20/12 0445  NA 136 135  K 3.6 4.1  CL 101 101  CO2 24 24  GLUCOSE 153* 166*  BUN 47* 37*  CREATININE 3.92* 2.73*  CALCIUM 8.0* 7.7*   No results found for this basename: PTH:2 in the last 72 hours Iron Studies: No results found for this basename: IRON,TIBC,TRANSFERRIN,FERRITIN in the last 72 hours  Studies/Results: Dg Chest Port 1 View  06/21/2012  *RADIOLOGY REPORT*  Clinical Data: Shortness of breath.  Cough.  Swelling.  PORTABLE CHEST - 1 VIEW  Comparison: 06/19/2012  Findings: The right IJ line has been removed.  The left IJ line tip remains in place, with tip orientation transverse along the right wall of the SVC.  The right costophrenic angle was excluded.  Increased left retrocardiac airspace opacity noted with obscuration of the left hemidiaphragm.  Faint linear opacities noted along the right hemidiaphragm.  Heart size is within normal limits.  Underlying thoracic spondylosis noted.  Faint biapical calcifications are  likely from remote inflammatory process.  IMPRESSION:  1.  Increased airspace opacity at the left lung base compatible with atelectasis or pneumonia. 2.  Subsegmental atelectasis at the right lung base. 3.  Right IJ line tip has been removed.  Left IJ line remains in place.  Perpendicular orientation of the left IJ line tip against the right side of the SVC is noted - catheter manipulation to provide for parallel orientation with the SVC may be indicated.  Original Report Authenticated By: Dellia Cloud, M.D.    I have reviewed the patient's current medications.  Assessment/Plan: 1 ARF nonoliguric, will lower vol. No clearance yet. Acid/base & K ok.   2 DM stable 3 Anemia 4 Fasciitis on AB 5 CM stable . Will hold Coreg for now to ^ renal perfusion press and restart in near future 6 Resp failure resolved. 7 SA on AB P Lasix, check CXR. Hold Coreg    LOS: 7 days   Leone Putman L 06/21/2012,8:37 AM

## 2012-06-21 NOTE — Progress Notes (Signed)
Patient ID: Wayne [name], male   DOB: [birth year], 71 y.o.   MRN: [id number] Left leg wound with partial thickness ischemic changes, will add silvadine to daily dressing changes

## 2012-06-21 NOTE — Progress Notes (Signed)
ANTIBIOTIC CONSULT NOTE - FOLLOW UP  Pharmacy Consult for Ancef Indication: MSSA bacteremia, left calf abscess  Allergies  Allergen Reactions  . Atorvastatin     REACTION: sore legs    Patient Measurements: Height: 5\' 10"  (177.8 cm) Weight: 246 lb 0.5 oz (111.6 kg) IBW/kg (Calculated) : 73  Adjusted Body Weight:   Vital Signs: Temp: 97.5 F (36.4 C) (07/08 0900) Temp src: Core (Comment) (07/08 0900) BP: 117/51 mmHg (07/08 0900) Pulse Rate: 110  (07/08 0900) Intake/Output from previous day: 07/07 0701 - 07/08 0700 In: 1000 [P.O.:360; I.V.:430; IV Piggyback:210] Out: 1875 [Urine:1875] Intake/Output from this shift:    Labs:  Basename 06/21/12 0500 06/20/12 0445 06/19/12 2025  WBC 10.0 10.5 10.8*  HGB 8.9* 9.0* 8.7*  PLT 269 238 215  LABCREA -- -- --  CREATININE 3.92* 2.73* 2.36*   Estimated Creatinine Clearance: 22.6 ml/min (by C-G formula based on Cr of 3.92). No results found for this basename: VANCOTROUGH:2,VANCOPEAK:2,VANCORANDOM:2,GENTTROUGH:2,GENTPEAK:2,GENTRANDOM:2,TOBRATROUGH:2,TOBRAPEAK:2,TOBRARND:2,AMIKACINPEAK:2,AMIKACINTROU:2,AMIKACIN:2, in the last 72 hours   Microbiology: Recent Results (from the past 720 hour(s))  CULTURE, BLOOD (ROUTINE X 2)     Status: Normal   Collection Time   06/14/12  1:20 PM      Component Value Range Status Comment   Specimen Description BLOOD ARM LEFT   Final    Special Requests BOTTLES DRAWN AEROBIC ONLY 10CC   Final    Culture  Setup Time 06/15/2012 02:15   Final    Culture     Final    Value: STAPHYLOCOCCUS AUREUS     Note: SUSCEPTIBILITIES PERFORMED ON PREVIOUS CULTURE WITHIN THE LAST 5 DAYS.     Note: Gram Stain Report Called to,Read Back By and Verified With: BURGUNDY Dudenhoeffer 06/15/12 1400 BY SMITHERSJ   Report Status 06/17/2012 FINAL   Final   CULTURE, BLOOD (ROUTINE X 2)     Status: Normal   Collection Time   06/14/12  1:40 PM      Component Value Range Status Comment   Specimen Description BLOOD LEFT HAND   Final    Special Requests BOTTLES DRAWN AEROBIC AND ANAEROBIC 10CC   Final    Culture  Setup Time 06/15/2012 02:15   Final    Culture     Final    Value: STAPHYLOCOCCUS AUREUS     Note: RIFAMPIN AND GENTAMICIN SHOULD NOT BE USED AS SINGLE DRUGS FOR TREATMENT OF STAPH INFECTIONS.     Note: Gram Stain Report Called to,Read Back By and Verified With: BRANDY DIN @ 1814 ON 06/15/12 BY GOLLD   Report Status 06/17/2012 FINAL   Final    Organism ID, Bacteria STAPHYLOCOCCUS AUREUS   Final   URINE CULTURE     Status: Normal   Collection Time   06/14/12  7:15 PM      Component Value Range Status Comment   Specimen Description URINE, RANDOM   Final    Special Requests NONE   Final    Culture  Setup Time 06/15/2012 01:48   Final    Colony Count NO GROWTH   Final    Culture NO GROWTH   Final    Report Status 06/16/2012 FINAL   Final   MRSA PCR SCREENING     Status: Normal   Collection Time   06/14/12  7:17 PM      Component Value Range Status Comment   MRSA by PCR NEGATIVE  NEGATIVE Final   GRAM STAIN     Status: Normal   Collection Time  06/15/12 12:39 PM      Component Value Range Status Comment   Specimen Description WOUND LEFT LOWER LEG   Final    Special Requests NONE   Final    Gram Stain     Final    Value: ABUNDANT WBC PRESENT,BOTH PMN AND MONONUCLEAR     ABUNDANT GRAM POSITIVE COCCI IN PAIRS IN CLUSTERS   Report Status 06/15/2012 FINAL   Final   ANAEROBIC CULTURE     Status: Normal   Collection Time   06/15/12 12:39 PM      Component Value Range Status Comment   Specimen Description WOUND LEFT LOWER LEG   Final    Special Requests NONE   Final    Gram Stain     Final    Value: ABUNDANT WBC PRESENT,BOTH PMN AND MONONUCLEAR     ABUNDANT GRAM POSITIVE COCCI IN PAIRS     IN CLUSTERS Performed at [facility]   Culture NO ANAEROBES ISOLATED   Final    Report Status 06/20/2012 FINAL   Final   WOUND CULTURE     Status: Normal (Preliminary result)   Collection Time   06/15/12 12:39 PM       Component Value Range Status Comment   Specimen Description WOUND LEFT LOWER LEG   Final    Special Requests NONE   Final    Gram Stain PENDING   Incomplete    Culture     Final    Value: ABUNDANT STAPHYLOCOCCUS AUREUS     Note: RIFAMPIN AND GENTAMICIN SHOULD NOT BE USED AS SINGLE DRUGS FOR TREATMENT OF STAPH INFECTIONS.   Report Status PENDING   Incomplete    Organism ID, Bacteria STAPHYLOCOCCUS AUREUS   Final   CULTURE, BLOOD (ROUTINE X 2)     Status: Normal (Preliminary result)   Collection Time   06/18/12  5:33 PM      Component Value Range Status Comment   Specimen Description BLOOD LEFT HAND   Final    Special Requests BOTTLES DRAWN AEROBIC ONLY 9CC   Final    Culture  Setup Time 06/19/2012 01:57   Final    Culture     Final    Value:        BLOOD CULTURE RECEIVED NO GROWTH TO DATE CULTURE WILL BE HELD FOR 5 DAYS BEFORE ISSUING A FINAL NEGATIVE REPORT   Report Status PENDING   Incomplete   CULTURE, BLOOD (ROUTINE X 2)     Status: Normal (Preliminary result)   Collection Time   06/18/12  5:48 PM      Component Value Range Status Comment   Specimen Description BLOOD LEFT ARM   Final    Special Requests BOTTLES DRAWN AEROBIC ONLY 4CC   Final    Culture  Setup Time 06/19/2012 01:57   Final    Culture     Final    Value:        BLOOD CULTURE RECEIVED NO GROWTH TO DATE CULTURE WILL BE HELD FOR 5 DAYS BEFORE ISSUING A FINAL NEGATIVE REPORT   Report Status PENDING   Incomplete   CULTURE, BLOOD (ROUTINE X 2)     Status: Normal (Preliminary result)   Collection Time   06/19/12  9:09 PM      Component Value Range Status Comment   Specimen Description BLOOD RIGHT HAND   Final    Special Requests BOTTLES DRAWN AEROBIC ONLY 5CC   Final    Culture  Setup Time  06/20/2012 03:01   Final    Culture     Final    Value:        BLOOD CULTURE RECEIVED NO GROWTH TO DATE CULTURE WILL BE HELD FOR 5 DAYS BEFORE ISSUING A FINAL NEGATIVE REPORT   Report Status PENDING   Incomplete   CULTURE, BLOOD (ROUTINE  X 2)     Status: Normal (Preliminary result)   Collection Time   06/19/12  9:09 PM      Component Value Range Status Comment   Specimen Description BLOOD RIGHT ARM   Final    Special Requests BOTTLES DRAWN AEROBIC ONLY 5CC   Final    Culture  Setup Time 06/20/2012 03:01   Final    Culture     Final    Value:        BLOOD CULTURE RECEIVED NO GROWTH TO DATE CULTURE WILL BE HELD FOR 5 DAYS BEFORE ISSUING A FINAL NEGATIVE REPORT   Report Status PENDING   Incomplete     Anti-infectives     Start     Dose/Rate Route Frequency Ordered Stop   06/21/12 2200   ceFAZolin (ANCEF) IVPB 1 g/50 mL premix        1 g 100 mL/hr over 30 Minutes Intravenous Every 12 hours 06/21/12 0941     06/18/12 1000   ceFAZolin (ANCEF) IVPB 2 g/50 mL premix  Status:  Discontinued        2 g 100 mL/hr over 30 Minutes Intravenous Every 12 hours 06/18/12 0910 06/21/12 0941   06/17/12 1800   imipenem-cilastatin (PRIMAXIN) 500 mg in sodium chloride 0.9 % 100 mL IVPB  Status:  Discontinued        500 mg 200 mL/hr over 30 Minutes Intravenous 4 times per day 06/17/12 1426 06/18/12 0909   06/16/12 0000   imipenem-cilastatin (PRIMAXIN) 250 mg in sodium chloride 0.9 % 100 mL IVPB  Status:  Discontinued        250 mg 200 mL/hr over 30 Minutes Intravenous 4 times per day 06/15/12 1900 06/17/12 1426   06/15/12 2200   linezolid (ZYVOX) IVPB 600 mg  Status:  Discontinued        600 mg 300 mL/hr over 60 Minutes Intravenous Every 12 hours 06/15/12 1712 06/18/12 0909   06/15/12 1900   clindamycin (CLEOCIN) IVPB 600 mg  Status:  Discontinued        600 mg 100 mL/hr over 30 Minutes Intravenous 3 times per day 06/15/12 1126 06/16/12 0820   06/15/12 1800   clindamycin (CLEOCIN) IVPB 600 mg  Status:  Discontinued        600 mg 100 mL/hr over 30 Minutes Intravenous 4 times per day 06/15/12 1125 06/15/12 1126   06/15/12 1800   imipenem-cilastatin (PRIMAXIN) 500 mg in sodium chloride 0.9 % 100 mL IVPB  Status:  Discontinued         500 mg 200 mL/hr over 30 Minutes Intravenous 3 times per day 06/15/12 1728 06/15/12 1900   06/15/12 1230   vancomycin (VANCOCIN) powder 1,000 mg  Status:  Discontinued        1,000 mg Other To Surgery 06/15/12 1215 06/15/12 1712   06/15/12 1222   vancomycin (VANCOCIN) powder  Status:  Discontinued          As needed 06/15/12 1223 06/15/12 1323   06/15/12 1220   gentamicin (GARAMYCIN) injection  Status:  Discontinued          As needed 06/15/12 1222 06/15/12  1323   06/15/12 1130   clindamycin (CLEOCIN) IVPB 900 mg        900 mg 100 mL/hr over 30 Minutes Intravenous STAT 06/15/12 1123 06/15/12 1235   06/15/12 0600   vancomycin (VANCOCIN) IVPB 1000 mg/200 mL premix  Status:  Discontinued        1,000 mg 200 mL/hr over 60 Minutes Intravenous Every 12 hours 06/14/12 1745 06/15/12 1712   06/15/12 0200   piperacillin-tazobactam (ZOSYN) IVPB 3.375 g  Status:  Discontinued        3.375 g 12.5 mL/hr over 240 Minutes Intravenous Every 8 hours 06/14/12 1745 06/15/12 1657   06/14/12 1615   vancomycin (VANCOCIN) IVPB 1000 mg/200 mL premix        1,000 mg 200 mL/hr over 60 Minutes Intravenous  Once 06/14/12 1608 06/14/12 1924   06/14/12 1615   piperacillin-tazobactam (ZOSYN) IVPB 3.375 g        3.375 g 100 mL/hr over 30 Minutes Intravenous  Once 06/14/12 1608 06/14/12 1854   06/14/12 1600   vancomycin (VANCOCIN) IVPB 1000 mg/200 mL premix        1,000 mg 200 mL/hr over 60 Minutes Intravenous  Once 06/14/12 1547 06/14/12 1721   06/14/12 1545   vancomycin (VANCOCIN) injection 1,000 mg  Status:  Discontinued        1,000 mg Intravenous  Once 06/14/12 1532 06/14/12 1547          Assessment: 71 yo M with MSSA bacteremia, cellulitis with abscess.  CRRT initiated 7/3, stopped 7/6.  SCr trending up, estimated creatinine clearance 23 ml/min.  No plans for intermittent HD at this time.  Cultures:  7/1 BCX x2 : MSSA  7/1 UCX:  NGF 7/2 Wound CX: MSSA    ABX:  7/1: Vanc >>7/2 7/1:  Zosyn>>7/2 7/2 Clinda >> 7/3 (d/t Zyvox start- protein inhibition with Zyvox) 7/2: Primaxin>>7/5 7/2: Zyvox>>7/5 7/5 Ancef >> planned for 4 wks total  Goal of Therapy:  Appropriate dosing  Plan:  -Change Ancef to 1 gm IV q12hr (plan for 4 weeks per MD) - f/u renal function  Rolland Porter, Pharm.D., BCPS Clinical Pharmacist Pager: [phone] 06/21/2012 9:43 AM

## 2012-06-22 DIAGNOSIS — A419 Sepsis, unspecified organism: Secondary | ICD-10-CM

## 2012-06-22 DIAGNOSIS — N179 Acute kidney failure, unspecified: Secondary | ICD-10-CM | POA: Diagnosis not present

## 2012-06-22 LAB — DIFFERENTIAL
Basophils Absolute: 0 10*3/uL (ref 0.0–0.1)
Eosinophils Absolute: 0.3 10*3/uL (ref 0.0–0.7)
Lymphocytes Relative: 13 % (ref 12–46)
Monocytes Relative: 5 % (ref 3–12)
Neutrophils Relative %: 79 % — ABNORMAL HIGH (ref 43–77)

## 2012-06-22 LAB — COMPREHENSIVE METABOLIC PANEL
ALT: <5 U/L (ref 0–53)
AST: 16 U/L (ref 0–37)
Calcium: 8.1 mg/dL — ABNORMAL LOW (ref 8.4–10.5)
Creatinine, Ser: 4.74 mg/dL — ABNORMAL HIGH (ref 0.50–1.35)
Sodium: 137 mEq/L (ref 135–145)
Total Protein: 4.7 g/dL — ABNORMAL LOW (ref 6.0–8.3)

## 2012-06-22 LAB — GLUCOSE, CAPILLARY
Glucose-Capillary: 122 mg/dL — ABNORMAL HIGH (ref 70–99)
Glucose-Capillary: 141 mg/dL — ABNORMAL HIGH (ref 70–99)
Glucose-Capillary: 85 mg/dL (ref 70–99)
Glucose-Capillary: 120 mg/dL — ABNORMAL HIGH (ref 70–99)

## 2012-06-22 LAB — CBC
HCT: 28.8 % — ABNORMAL LOW (ref 39.0–52.0)
Platelets: 295 10*3/uL (ref 150–400)
RDW: 17.4 % — ABNORMAL HIGH (ref 11.5–15.5)
WBC: 10.5 10*3/uL (ref 4.0–10.5)

## 2012-06-22 LAB — CARBOXYHEMOGLOBIN
Carboxyhemoglobin: 1.5 % (ref 0.5–1.5)
Methemoglobin: 1 % (ref 0.0–1.5)
O2 Saturation: 57.6 %

## 2012-06-22 LAB — PHOSPHORUS: Phosphorus: 5.9 mg/dL — ABNORMAL HIGH (ref 2.3–4.6)

## 2012-06-22 MED ORDER — SODIUM CHLORIDE 0.9 % IJ SOLN
INTRAMUSCULAR | Status: AC
  Filled 2012-06-22: qty 30

## 2012-06-22 MED ORDER — SODIUM CHLORIDE 0.9 % IJ SOLN
INTRAMUSCULAR | Status: AC
  Filled 2012-06-22: qty 20

## 2012-06-22 MED ORDER — SODIUM CHLORIDE 0.9 % IJ SOLN
INTRAMUSCULAR | Status: AC
  Administered 2012-06-22: 11:00:00
  Filled 2012-06-22: qty 10

## 2012-06-22 MED ORDER — FENTANYL CITRATE 0.05 MG/ML IJ SOLN
50.0000 ug | Freq: Once | INTRAMUSCULAR | Status: AC
  Administered 2012-06-22: 50 ug via INTRAVENOUS

## 2012-06-22 MED ORDER — SODIUM CHLORIDE 0.9 % IJ SOLN
INTRAMUSCULAR | Status: AC
  Filled 2012-06-22: qty 10

## 2012-06-22 MED ORDER — PRO-STAT SUGAR FREE PO LIQD
30.0000 mL | Freq: Three times a day (TID) | ORAL | Status: DC
  Administered 2012-06-22 – 2012-06-23 (×4): 30 mL via ORAL
  Filled 2012-06-22 (×8): qty 30

## 2012-06-22 MED ORDER — GLUCERNA SHAKE PO LIQD: 237.0000 mL | Freq: Two times a day (BID) | ORAL | Status: DC | PRN

## 2012-06-22 NOTE — Evaluation (Signed)
Occupational Therapy Evaluation Patient Details Name: Wayne [name] MRN: [id number] DOB: 14-Oct-1941 Today's Date: 06/22/2012 Time: 4098-1191 OT Time Calculation (min): 34 min  OT Assessment / Plan / Recommendation Clinical Impression  Pt admitted with LLE redness and swelling and noted to have Bacteremia and Sepsis w/ SA. Cellulitis of LLE with abscess and now s/p I&D. Pt VDRF 7/1-7/4. Pt presents with LLE pain, generalized weakness secondary to prolonged inactivity, decreased mobility and overall decreased I with ADL. Pt will benefit from skilled OT in the acute setting to maximize I with ADL and ADL mobility prior to d/c.     OT Assessment  Patient needs continued OT Services    Follow Up Recommendations  Inpatient Rehab    Barriers to Discharge      Equipment Recommendations  Defer to next venue    Recommendations for Other Services Rehab consult  Frequency  Min 2X/week    Precautions / Restrictions Precautions Precautions: Fall Restrictions Weight Bearing Restrictions: No   Pertinent Vitals/Pain Pt reports 5/10 LLE pain; pre-medicated. HR remained fairly stable during session (110-120bpm)     ADL  Lower Body Dressing: Performed;+1 Total assistance (don socks) Where Assessed - Lower Body Dressing: Supported sitting Toilet Transfer: Simulated;+2 Total assistance Toilet Transfer: Patient Percentage: 60% Toilet Transfer Method: Sit to Barista:  (from EOB) Toileting - Clothing Manipulation and Hygiene: Simulated;Moderate assistance Where Assessed - Toileting Clothing Manipulation and Hygiene: Standing Equipment Used: Rolling walker Transfers/Ambulation Related to ADLs: pt able to take one step back and 2 side steps to the left with Max VC for WB through UE's and Total assist to move RW    OT Diagnosis: Generalized weakness;Acute pain  OT Problem List: Decreased strength;Decreased activity tolerance;Impaired balance (sitting and/or standing);Decreased  knowledge of use of DME or AE;Decreased knowledge of precautions;Cardiopulmonary status limiting activity;Pain;Impaired UE functional use;Increased edema (bil UE edema noted) OT Treatment Interventions: Self-care/ADL training;DME and/or AE instruction;Therapeutic activities;Patient/family education;Balance training   OT Goals Acute Rehab OT Goals OT Goal Formulation: With patient Time For Goal Achievement: 07/06/12 Potential to Achieve Goals: Good ADL Goals Pt Will Perform Grooming: with supervision;Standing at sink ADL Goal: Grooming - Progress: Goal set today Pt Will Perform Lower Body Bathing: with min assist;Sit to stand from chair;Sit to stand from bed ADL Goal: Lower Body Bathing - Progress: Goal set today Pt Will Perform Lower Body Dressing: Sit to stand from chair;Sit to stand from bed;with min assist;with adaptive equipment ADL Goal: Lower Body Dressing - Progress: Goal set today Pt Will Transfer to Toilet: with supervision;Ambulation;with DME;3-in-1 ADL Goal: Toilet Transfer - Progress: Goal set today Pt Will Perform Toileting - Clothing Manipulation: with supervision;Standing ADL Goal: Toileting - Clothing Manipulation - Progress: Goal set today Pt Will Perform Toileting - Hygiene: with supervision;with set-up;Sit to stand from 3-in-1/toilet;Sitting on 3-in-1 or toilet ADL Goal: Toileting - Hygiene - Progress: Goal set today Additional ADL Goal #1: Pt will be I with bil UE strengthening HEP in prep for UB use with ADLs ADL Goal: Additional Goal #1 - Progress: Goal set today  Visit Information  Last OT Received On: 06/22/12 Assistance Needed: +2 PT/OT Co-Evaluation/Treatment: Yes    Subjective Data  Subjective: I really want to be able to get back to my normal self Patient Stated Goal: Return home   Prior Functioning  Home Living Lives With: Spouse Available Help at Discharge: Available 24 hours/day;Family Type of Home: House Home Access: Stairs to enter [employer] of Steps: 2 Entrance Stairs-Rails:  None Home Layout: One level Bathroom Shower/Tub: Walk-in shower;Door [employer] Toilet: Standard Bathroom Accessibility: Yes How Accessible: Accessible via walker Home Adaptive Equipment: Walker - rolling;Crutches Prior Function Level of Independence: Independent Able to Take Stairs?: Yes Driving: No Vocation: Retired Musician: No difficulties Dominant Hand: Right    Cognition  Overall Cognitive Status: Appears within functional limits for tasks assessed/performed Arousal/Alertness: Awake/alert Orientation Level: Appears intact for tasks assessed Behavior During Session: [facility] for tasks performed    Extremity/Trunk Assessment Right Upper Extremity Assessment RUE ROM/Strength/Tone: Deficits RUE ROM/Strength/Tone Deficits: generally weak from prolong bedrest; grossly 4/5 RUE Sensation: WFL - Light Touch RUE Coordination: WFL - gross/fine motor Left Upper Extremity Assessment LUE ROM/Strength/Tone: Deficits LUE ROM/Strength/Tone Deficits: generally weak from prolong bedrest; grossly 4/5 LUE Sensation: WFL - Light Touch LUE Coordination: WFL - gross/fine motor   Mobility Bed Mobility Bed Mobility: Rolling Left;Left Sidelying to Sit;Sit to Sidelying Left;Rolling Right Rolling Left: 3: Mod assist;With rail Left Sidelying to Sit: 1: +2 Total assist;With rails;HOB elevated Left Sidelying to Sit: Patient Percentage: 60% Sit to Sidelying Left: 3: Mod assist;With rail;HOB elevated Transfers Transfers: Sit to Stand;Stand to Sit Sit to Stand: 1: +2 Total assist;From bed;With upper extremity assist Sit to Stand: Patient Percentage: 60% Stand to Sit: 1: +2 Total assist;To bed;With upper extremity assist Stand to Sit: Patient Percentage: 70% Details for Transfer Assistance: VC for hand placement; A to control descent   Exercise    Balance    End of Session OT - End of Session Activity Tolerance: Patient limited by  pain;Patient limited by fatigue Patient left: in bed;with call bell/phone within reach Nurse Communication: Mobility status  GO     Wayne [name] 06/22/2012, 3:46 PM

## 2012-06-22 NOTE — Progress Notes (Signed)
Name: Wayne [name] MRN: [id number] DOB: [birth year]    LOS: 8 Date of admit 06/14/2012 12:48 PM PCP is Kristian Covey, MD Ortho is Dr Jonathon Bellows  Referring Provider:  Dr Preston Fleeting of ER Reason for Referral:  Sepsis, hypotension, acidsis , hyperglycemia  PULMONARY / CRITICAL CARE MEDICINE  71 yr old septic shock, resp failure, leg source. S/p irrigation and debridement of LE on 7/2  Events Since Admission: 7/2- refractory shock post op, acidosis, arf, epi required 7/2 irrigation and debridement of LLE 7/2 2 units PRBC 7/3- Improved pressors 7/5- Off all pressors 7/6 2 units PRBC 7/8>>> SDU  Overnight: C/o abd bloating. No cough. No dyspnea.   Current Status: C/o bloating,  conversant  Lines/Tubes: ETT 7/1>>7/5 NG Tube 7/1 >>7/5 Foley 7/1>>> RIJ 7/1>>>dc  R Radial 7/1>>>7/4 RPIV 7/1>>>7/4 LPIV 7/1>>> Premrose Drains7/2>>>7/5 L IJ HD catheter 7/4>>  ABX: Vanc 7/1 >>> 7/2 Zosyn 7/1 >>> 7/2 clinda7/2>>>7/3 Linezolid 7/2>>>7/5 Imipenim 7/2>>>7/5 Cefazolin ( MSSA cellulitis/abscess LLE per ID) 7/5 >>>  Vital Signs: Temp:  [97.4 F (36.3 C)-98.4 F (36.9 C)] 97.8 F (36.6 C) (07/09 0700) Pulse Rate:  [91-100] 100  (07/09 0430) Resp:  [15-19] 18  (07/09 0430) BP: (111-127)/(54-65) 127/62 mmHg (07/09 0430) SpO2:  [91 %-97 %] 91 % (07/09 0430) Weight:  [111.4 kg (245 lb 9.5 oz)] 111.4 kg (245 lb 9.5 oz) (07/09 0430)  Physical Examination: General: NAD HEENT: NCAT, EOMi, HD cath in place PULM: CTA B CV: RRR, no mgr AB: BS+, soft, nontender Ext: edema, dry dressings, tender LLE Neuro: A&Ox4  Principal Problem:  *Staphylococcus aureus bacteremia with sepsis Active Problems:  HYPERTROPHY PROSTATE W/UR OBST & OTH LUTS  CORONARY ATHEROSCLEROSIS NATIVE CORONARY ARTERY  Polymyalgia rheumatica  Systolic CHF, chronic  OSA (obstructive sleep apnea)  Obesity  Cellulitis and abscess of lower leg  Healthcare-associated pneumonia  Hyperglycemia  Acute encephalopathy  NSTEMI, initial episode of care  AKI (acute kidney injury)   ASSESSMENT AND PLAN  PULMONARY  Lab 06/21/12 0510 06/18/12 0330 06/17/12 0445 06/16/12 0544 06/15/12 1737  PHART 7.467* 7.475* 7.496* 7.416 7.260*  PCO2ART 31.9* 32.5* 29.6* 28.3* 28.8*  PO2ART 65.4* 99.4 158.0* 125.0* 125.0*  HCO3 22.9 23.9 22.8 18.0* 12.9*  O2SAT 97.0 99.6 100.0 99.0 98.0    Extubated on RA  ETT: 7/1>>>7/5  A:  Acute Resp Distress due to metabolic acidosis +/- LLL PNA,  r/o ards, not impressed overload. Resolved Doing well since extubation 7/5. No supplemental O2 requirement. Pt w/ h/o OSA.  P:    - CXR if deteriorates - will need to determine whether he uses CPAP, order if so. Outpt PSG if none done in the past   CARDIOVASCULAR No results found for this basename: TROPONINI:5,LATICACIDVEN:5, O2SATVEN:5,PROBNP:5 in the last 168 hours  Lab 06/22/12 0424 06/21/12 0500 06/20/12 0445  WBC 10.5 10.0 10.5  HGB 9.6* 8.9* 9.0*  HCT 28.8* 27.2* 26.8*  PLT 295 269 238    ECG: no St changes Lines:  RIJ 7/1>>>7/4 L IJ HD 7/4>> LPIV 7/1>>>   A: Severe sepsis and hypotension - Pt EF of 45% 2009. Echo ordered as inpt showing EF 15%, diffuse hypo. 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 Repeat as outpt. BP improved w/ multiple pressors and significant fluid boluses. Now off all pressors. Heparin w/ HD. Off stress steroids. Troponins were elevated again on 7/2, likely from cardiac strain. No CP. P:  Per cardiology team and appreciate   RENAL  Lab 06/22/12 0424 06/21/12 0500 06/20/12  4540 06/19/12 2025 06/19/12 0500 06/18/12 1600 06/18/12 0500 06/17/12 0440 06/16/12 1300  NA 137 136 135 134* 135 -- -- -- --  K 4.1 3.6 -- -- -- -- -- -- --  CL 101 101 101 101 101 -- -- -- --  CO2 22 24 24 25 25  -- -- -- --  BUN 55* 47* 37* 34* 29* -- -- -- --  CREATININE 4.74* 3.92* 2.73* 2.36* 1.88* -- -- -- --  CALCIUM 8.1* 8.0* 7.7* 7.6* 7.8* -- -- -- --  MG -- -- 2.3 -- 2.4 -- 2.2 2.2 2.0  PHOS 5.9* -- 3.6  -- 3.2 2.3 3.0 -- --   Intake/Output      07/08 0701 - 07/09 0700 07/09 0701 - 07/10 0700   P.O. 120    I.V. (mL/kg) 400 (3.6)    IV Piggyback 50    Total Intake(mL/kg) 570 (5.1)    Urine (mL/kg/hr) 1725 (0.6)    Total Output 1725    Net -1155          Foley: 7/1>>>  A:  Sepsis induced ATN/ARF slowly better, now in post ATN diuresis phase  P:   Per renal, No HD today  GASTROINTESTINAL  Lab 06/22/12 0424 06/20/12 0445 06/19/12 2025 06/19/12 0500 06/18/12 1600 06/16/12 1700 06/16/12 0500  AST 16 -- 25 -- -- 35 58*  ALT <5 -- 9 -- -- 30 38  ALKPHOS 69 -- 75 -- -- 86 67  BILITOT 0.2* -- 0.3 -- -- 0.5 0.8  PROT 4.7* -- 4.6* -- -- 4.7* 4.7*  ALBUMIN 1.5* 1.4* 1.5* 1.6* 1.4* -- --    A:  Mild ileus. Abdominal bloating  P:   ppi - continue diet as tolerated  HEMATOLOGIC  Lab 06/22/12 0424 06/21/12 0500 06/20/12 0445 06/19/12 2025 06/19/12 1100 06/19/12 0500 06/19/12 0200 06/18/12 0500  HGB 9.6* 8.9* 9.0* 8.7* 8.8* -- -- --  HCT 28.8* 27.2* 26.8* 26.1* 26.7* -- -- --  PLT 295 269 238 215 -- 225 -- --  INR -- -- -- -- -- -- 1.13 --  APTT -- -- -- -- -- -- >200* 86*   A:  Hgb stable since transfusion.   P:  - cbc in am    INFECTIOUS  Lab 06/22/12 0424 06/21/12 0500 06/20/12 0445 06/19/12 2025 06/19/12 0500  WBC 10.5 10.0 10.5 10.8* 16.3*  PROCALCITON -- -- -- -- --   Cultures: 7/1 BCX: >>> SA pansens 7/1 BCX:>>> SA 7/1 UCX >>>  No growth 7/2 Wound CX: >>> MSSA  ABX:  Per ID SVC Vanc 7/1 >>> 7/2 Zosyn 7/1 >>> 7/2 7/2 clinda>>> 7/3 Linezolid 7/2>>>7/5 Imipenim 7/2>>>7/5 Ancef 7/5>>>  A:  Bacteremia and Sepsis w/ SA that is pan sensitive. Cellulitis LLE w/ Abscess, s/p irriagation and debridement.  P:   Continue ancef 28d total thera;y indicated IV   ENDOCRINE  Lab 06/22/12 0812 06/21/12 2144 06/21/12 1624 06/21/12 1150 06/21/12 0826  GLUCAP 122* 127* 169* 83 157*   A:  Hyperglycemia likely reaction to sepsis and not DKA . Known diabetic on oral  agents. Cortisol is off so will likely improve. Now on lantus and resistent SSI P:   No oral agents in hospital Continue SSI and Lantus   NEUROLOGIC  A:  Pain control only issue  P:   Prn fentanyl lidoderm patch  BEST PRACTICE / DISPOSITION Level of Care:  SDU Primary Service:  pccm Consultants:  Ortho Dr Lajoyce Corners  Code Status:  Full  Diet:  PO DVT Px:  Hep SQ GI Px:  ppi Skin Integrity:  Intact other than LLE Social / Family: non present on rounds 7/9   Shan Levans [facility]  [phone]  Cell  [phone]  If no response or cell goes to voicemail, call beeper [phone]  06/22/2012, 10:21 AM

## 2012-06-22 NOTE — Progress Notes (Signed)
Subjective: Interval History: has complaints bloating.  Objective: Vital signs in last 24 hours: Temp:  [97.4 F (36.3 C)-98.4 F (36.9 C)] 97.8 F (36.6 C) (07/09 0700) Pulse Rate:  [91-100] 100  (07/09 0430) Resp:  [15-19] 18  (07/09 0430) BP: (110-127)/(54-65) 127/62 mmHg (07/09 0430) SpO2:  [91 %-97 %] 91 % (07/09 0430) Weight:  [111.4 kg (245 lb 9.5 oz)] 111.4 kg (245 lb 9.5 oz) (07/09 0430) Weight change:   Intake/Output from previous day: 07/08 0701 - 07/09 0700 In: 570 [P.O.:120; I.V.:400; IV Piggyback:50] Out: 1725 [Urine:1725] Intake/Output this shift:    General appearance: alert, cooperative, moderately obese and pale Resp: diminished breath sounds bilaterally and rales bibasilar Cardio: S1, S2 normal and systolic murmur: holosystolic 2/6, blowing at apex GI: pos bs,mild distension, liver down 5 cm Extremities: edema 3+, ulcers L leg and   Lab Results:  Basename 06/22/12 0424 06/21/12 0500  WBC 10.5 10.0  HGB 9.6* 8.9*  HCT 28.8* 27.2*  PLT 295 269   BMET:  Basename 06/22/12 0424 06/21/12 0500  NA 137 136  K 4.1 3.6  CL 101 101  CO2 22 24  GLUCOSE 129* 153*  BUN 55* 47*  CREATININE 4.74* 3.92*  CALCIUM 8.1* 8.0*   No results found for this basename: PTH:2 in the last 72 hours Iron Studies: No results found for this basename: IRON,TIBC,TRANSFERRIN,FERRITIN in the last 72 hours  Studies/Results: Dg Chest Port 1 View  06/21/2012  *RADIOLOGY REPORT*  Clinical Data: Cough.  CHF.  PORTABLE CHEST - 1 VIEW  Comparison: 70 08/03/2012 6/3 8:00 a.m.  Findings: Left central line is in place with the tip at the level of the proximal superior vena cava directed laterally.  This may impress upon the lateral wall of the superior vena cava.  Consider redirecting tip inferiorly to avoid injury.  Consolidation left base may represent infiltrate, atelectasis and / or pleural effusion.  Minimal increased markings right hilar region.  Mild central pulmonary vascular  prominence.  Heart size within normal limits. Slightly tortuous aorta.  IMPRESSION: No significant change.  Please see above discussion.  Original Report Authenticated By: Fuller Canada, M.D.   Dg Chest Port 1 View  06/21/2012  *RADIOLOGY REPORT*  Clinical Data: Shortness of breath.  Cough.  Swelling.  PORTABLE CHEST - 1 VIEW  Comparison: 06/19/2012  Findings: The right IJ line has been removed.  The left IJ line tip remains in place, with tip orientation transverse along the right wall of the SVC.  The right costophrenic angle was excluded.  Increased left retrocardiac airspace opacity noted with obscuration of the left hemidiaphragm.  Faint linear opacities noted along the right hemidiaphragm.  Heart size is within normal limits.  Underlying thoracic spondylosis noted.  Faint biapical calcifications are likely from remote inflammatory process.  IMPRESSION:  1.  Increased airspace opacity at the left lung base compatible with atelectasis or pneumonia. 2.  Subsegmental atelectasis at the right lung base. 3.  Right IJ line tip has been removed.  Left IJ line remains in place.  Perpendicular orientation of the left IJ line tip against the right side of the SVC is noted - catheter manipulation to provide for parallel orientation with the SVC may be indicated.  Original Report Authenticated By: Dellia Cloud, M.D.    I have reviewed the patient's current medications.  Assessment/Plan: 1 ARF diuresing, no solute clearance yet, but should come.  Na diuresis post ATN. 2 Anemia, check Fe 3 Fasciitis on AB  4 CM stable 5 DM fair control 6 Obesity 7 Bloating  P Check Fe, allow to diurese, follow Cr    LOS: 8 days   Findley Blankenbaker L 06/22/2012,9:22 AM

## 2012-06-22 NOTE — Progress Notes (Signed)
eLink Physician-Brief Progress Note Patient Name: DSHAWN MCNAY DOB: Aug 18, 1941 MRN: [id number]  Date of Service  06/22/2012   HPI/Events of Note  RN reports petechial rash over arms & trunk   eICU Interventions  Hold further doses of ancef until seen by rounding MD in am, note ID also following.   Intervention Category Intermediate Interventions: Other:  ALVA,RAKESH V. 06/22/2012, 10:40 PM

## 2012-06-22 NOTE — Progress Notes (Signed)
CVP reading at 1400 was 6 after set up was done by respiratory.

## 2012-06-22 NOTE — Evaluation (Signed)
Physical Therapy Evaluation Patient Details Name: Wayne [name] MRN: [id number] DOB: 1941/05/10 Today's Date: 06/22/2012 Time: 4098-1191 PT Time Calculation (min): 34 min  PT Assessment / Plan / Recommendation Clinical Impression  Pt admitted with LLE redness and swelling and noted to have Bacteremia and Sepsis w/ SA. Cellulitis of LLE with abscess and now s/p I&D. Pt VDRF 7/1-7/4. Pt presents with LLE pain, generalized weakness secondary to prolonged inactivity, decreased mobility and overall decreased I with ADL. Pt will benefit from skilled PT in the acute setting to maximize I with ADL and ADL mobility prior to d/c.     PT Assessment  Patient needs continued PT services    Follow Up Recommendations  Inpatient Rehab (or HHPT    IF he improves significantly)    Barriers to Discharge        Equipment Recommendations  Defer to next venue    Recommendations for Other Services Rehab consult   Frequency Min 3X/week    Precautions / Restrictions Precautions Precautions: Fall Restrictions Weight Bearing Restrictions: No   Pertinent Vitals/Pain       Mobility  Bed Mobility Bed Mobility: Rolling Left;Left Sidelying to Sit;Sit to Sidelying Left;Rolling Right Rolling Left: 3: Mod assist;With rail Left Sidelying to Sit: 1: +2 Total assist;With rails;HOB elevated Left Sidelying to Sit: Patient Percentage: 60% Sit to Sidelying Left: 3: Mod assist;With rail;HOB elevated Details for Bed Mobility Assistance: vc's for technique to decr pain;  LE assist Transfers Transfers: Sit to Stand;Stand to Sit Sit to Stand: 1: +2 Total assist;From bed;With upper extremity assist Sit to Stand: Patient Percentage: 60% Stand to Sit: 1: +2 Total assist;To bed;With upper extremity assist Stand to Sit: Patient Percentage: 70% Details for Transfer Assistance: VC for hand placement; A to control descent Ambulation/Gait Ambulation/Gait Assistance: 1: +2 Total assist Ambulation/Gait: Patient Percentage:  60% Ambulation Distance (Feet): 1 Feet (side stepping) Assistive device: Rolling walker    Exercises     PT Diagnosis: Difficulty walking;Generalized weakness;Acute pain  PT Problem List: Decreased strength;Decreased range of motion;Decreased activity tolerance;Decreased balance;Decreased mobility;Decreased knowledge of use of DME;Decreased knowledge of precautions;Pain PT Treatment Interventions: DME instruction;Gait training;Stair training;Functional mobility training;Therapeutic activities;Balance training;Patient/family education   PT Goals Acute Rehab PT Goals PT Goal Formulation: With patient Time For Goal Achievement: 06/29/12 Potential to Achieve Goals: Good Pt will go Supine/Side to Sit: with supervision;with HOB 0 degrees PT Goal: Supine/Side to Sit - Progress: Goal set today Pt will go Sit to Stand: with min assist PT Goal: Sit to Stand - Progress: Goal set today Pt will Transfer Bed to Chair/Chair to Bed: with min assist PT Transfer Goal: Bed to Chair/Chair to Bed - Progress: Goal set today Pt will Ambulate: 1 - 15 feet;with min assist;with rolling walker;with least restrictive assistive device PT Goal: Ambulate - Progress: Goal set today Pt will Go Up / Down Stairs: 1-2 stairs;with min assist;with rail(s);Other (comment) (IF HE IS TO GO DIRECTLY HOME ONLY) PT Goal: Up/Down Stairs - Progress: Goal set today  Visit Information  Last PT Received On: 06/22/12 Assistance Needed: +2 PT/OT Co-Evaluation/Treatment: Yes    Subjective Data  Subjective: I'd like to get my pain meds first Patient Stated Goal: Back home I   Prior Functioning  Home Living Lives With: Spouse Available Help at Discharge: Available 24 hours/day;Family Type of Home: House Home Access: Stairs to enter [employer] of Steps: 2 Entrance Stairs-Rails: None Home Layout: One level Bathroom Shower/Tub: Walk-in shower;Door [employer] Toilet: Standard Bathroom Accessibility: Yes How  Accessible: Accessible via walker Home Adaptive Equipment: Walker - rolling;Crutches Prior Function Level of Independence: Independent Able to Take Stairs?: Yes Driving: No Vocation: Retired Musician: No difficulties Dominant Hand: Right    Cognition  Overall Cognitive Status: Appears within functional limits for tasks assessed/performed Arousal/Alertness: Awake/alert Orientation Level: Appears intact for tasks assessed Behavior During Session: [facility] for tasks performed    Extremity/Trunk Assessment Right Upper Extremity Assessment RUE ROM/Strength/Tone: Deficits RUE ROM/Strength/Tone Deficits: generally weak from prolong bedrest; grossly 4/5 RUE Sensation: WFL - Light Touch RUE Coordination: WFL - gross/fine motor Left Upper Extremity Assessment LUE ROM/Strength/Tone: Deficits LUE ROM/Strength/Tone Deficits: generally weak from prolong bedrest; grossly 4/5 LUE Sensation: WFL - Light Touch LUE Coordination: WFL - gross/fine motor Right Lower Extremity Assessment RLE ROM/Strength/Tone: Within functional levels RLE Sensation: WFL - Light Touch RLE Coordination: WFL - gross/fine motor Left Lower Extremity Assessment LLE ROM/Strength/Tone: Unable to fully assess;Due to pain (WFL at hip, ) Trunk Assessment Trunk Assessment: Normal   Balance Balance Balance Assessed: Yes  End of Session PT - End of Session Activity Tolerance: Patient tolerated treatment well Patient left: in bed;with call bell/phone within reach Nurse Communication: Mobility status  GP     Henrick Mcgue, Eliseo Gum 06/22/2012, 4:01 PM  06/22/2012  Bristol Bing, PT [phone] (pager)

## 2012-06-22 NOTE — Progress Notes (Signed)
Nutrition Follow-up  Intervention:    Glucerna Shake PO BID prn.  Pro-stat 30 ml TID with meals to maximize protein intake.  Assessment:   Patient was extubated on 7/5.  S/P HD this morning.  Patient with MSSA bacteremia, left calf abscess; needs increased protein to support wound healing.    PO intake of meals is good overall.  Patient reports he is consuming at least 75% of meals.  Did not eat well yesterday and earlier today due to bloating, but this has subsided.  Thinks that the Glucerna Shake or Ensure caused some GI distress a few days ago.  Diet Order:  Heart Healthy; Glucerna Shake PO TID between meals  Meds: Scheduled Meds:   .  ceFAZolin (ANCEF) IV  1 g Intravenous Q12H  . feeding supplement  237 mL Oral TID BM  . fentaNYL  50 mcg Intravenous Once  . heparin subcutaneous  5,000 Units Subcutaneous Q8H  . insulin aspart  0-20 Units Subcutaneous TID WC  . insulin aspart  0-5 Units Subcutaneous QHS  . insulin aspart  6 Units Subcutaneous TID WC  . insulin glargine  10 Units Subcutaneous Daily  . pantoprazole  40 mg Oral Q1200  . polyethylene glycol  17 g Oral Daily  . senna-docusate  1 tablet Oral QHS  . silver sulfADIAZINE   Topical Daily  . sodium chloride      . sodium chloride       Continuous Infusions:  PRN Meds:.sodium chloride, acetaminophen, fentaNYL, lidocaine, magnesium sulfate 1 - 4 g bolus IVPB, oxyCODONE  Labs:  CMP     Component Value Date/Time   NA 137 06/22/2012 0424   K 4.1 06/22/2012 0424   CL 101 06/22/2012 0424   CO2 22 06/22/2012 0424   GLUCOSE 129* 06/22/2012 0424   BUN 55* 06/22/2012 0424   CREATININE 4.74* 06/22/2012 0424   CREATININE 0.78 06/30/2011 1702   CALCIUM 8.1* 06/22/2012 0424   PROT 4.7* 06/22/2012 0424   ALBUMIN 1.5* 06/22/2012 0424   AST 16 06/22/2012 0424   ALT <5 06/22/2012 0424   ALKPHOS 69 06/22/2012 0424   BILITOT 0.2* 06/22/2012 0424   GFRNONAA 11* 06/22/2012 0424   GFRAA 13* 06/22/2012 0424   CBG (last 3)   Basename 06/22/12 1135 06/22/12  0812 06/21/12 2144  GLUCAP 141* 122* 127*     Intake/Output Summary (Last 24 hours) at 06/22/12 1514 Last data filed at 06/22/12 1400  Gross per 24 hour  Intake    523 ml  Output   1551 ml  Net  -1028 ml    Weight Status:  111.4 kg up from 106.9 kg on 7/3  Re-estimated needs:  2300-2500 kcals, 125-140 grams protein, 2.3-2.5 liters fluid daily  Nutrition Dx:  Inadequate oral intake, now related to increased calorie and protein needs to support healing as evidenced by 75% meal completion only meets ~65% of estimated nutrition needs.  Status:  Ongoing.  Goal:  Enteral nutrition to provide 60-70% of estimated calorie needs (22-25 kcals/kg ideal body weight) and 100% of estimated protein needs, based on ASPEN guidelines for permissive underfeeding in critically ill obese individuals, no longer applicable.  New Goal:  Intake to meet >90% of estimated nutrition needs to support healing.  Monitor:  PO intake, labs, weight trend, wound healing.   Joaquin Courts, RD, CNSC, LDN Pager# [phone] After Hours Pager# [phone]

## 2012-06-22 NOTE — Progress Notes (Signed)
Utilization review completed.  

## 2012-06-22 NOTE — Progress Notes (Addendum)
Advanced Heart Failure Rounding Note   Subjective:    Aspirin and Effient stopped due to hematoma in early May 2013. Admit with LLE calf absess. MSSA bacteremia. Reduced EF noted 15% (from 45%) this admit which was thought to be septic cardiomyopathy. HF medications stopped  May 13 by the patient for unknown reasons. He had been on carvedilol, lisinopril, lasix, spironolactone, and digoxin in the past.    Levophed stopped 7/6 Just stopped CVVH 7/6. Weight up 19 pounds since admit. 24 hour I/O -1.1 liters  Creatinine 1.8 >2.3>2.73>3.9 >4.7 .  Denies SOB/PND/Orthopnea.  Feels bloated, not hungry.     Objective:   Weight Range:  Vital Signs:   Temp:  [97.4 F (36.3 C)-98.4 F (36.9 C)] 97.8 F (36.6 C) (07/09 0700) Pulse Rate:  [91-100] 100  (07/09 0430) Resp:  [15-19] 18  (07/09 0430) BP: (111-127)/(54-65) 127/62 mmHg (07/09 0430) SpO2:  [91 %-97 %] 91 % (07/09 0430) Weight:  [111.4 kg (245 lb 9.5 oz)] 111.4 kg (245 lb 9.5 oz) (07/09 0430) Last BM Date: 06/20/12  Weight change: Filed Weights   06/19/12 0500 06/20/12 0700 06/22/12 0430  Weight: 110.2 kg (242 lb 15.2 oz) 111.6 kg (246 lb 0.5 oz) 111.4 kg (245 lb 9.5 oz)    Intake/Output:   Intake/Output Summary (Last 24 hours) at 06/22/12 1047 Last data filed at 06/22/12 0700  Gross per 24 hour  Intake    460 ml  Output   1575 ml  Net  -1115 ml     Physical Exam: General:  Chronically ill appearing. No resp difficulty HEENT: normal Neck: supple. JVP to ear. Carotids 2+ bilat; no bruits. No lymphadenopathy or thryomegaly appreciated. Cor: PMI nondisplaced. Regular rate & rhythm. No rubs, or murmurs. S3 Lungs: clear Abdomen: soft, nontender, distended. No hepatosplenomegaly. No bruits or masses. Good bowel sounds. Extremities: no cyanosis, clubbing, rash, LLE ace wrap above knee RLE  2+ edema Neuro: alert & orientedx3, cranial nerves grossly intact. moves all 4 extremities w/o difficulty. Affect pleasant  Telemetry:  ST 104  Labs: Basic Metabolic Panel:  Lab 06/22/12 4782 06/21/12 0500 06/20/12 0445 06/19/12 2025 06/19/12 0500 06/18/12 1600 06/18/12 0500 06/17/12 0440 06/16/12 1300  NA 137 136 135 134* 135 -- -- -- --  K 4.1 3.6 4.1 3.7 4.4 -- -- -- --  CL 101 101 101 101 101 -- -- -- --  CO2 22 24 24 25 25  -- -- -- --  GLUCOSE 129* 153* 166* 169* 246* -- -- -- --  BUN 55* 47* 37* 34* 29* -- -- -- --  CREATININE 4.74* 3.92* 2.73* 2.36* 1.88* -- -- -- --  CALCIUM 8.1* 8.0* 7.7* -- -- -- -- -- --  MG -- -- 2.3 -- 2.4 -- 2.2 2.2 2.0  PHOS 5.9* -- 3.6 -- 3.2 2.3 3.0 -- --    Liver Function Tests:  Lab 06/22/12 0424 06/20/12 0445 06/19/12 2025 06/19/12 0500 06/18/12 1600 06/16/12 1700 06/16/12 0500  AST 16 -- 25 -- -- 35 58*  ALT <5 -- 9 -- -- 30 38  ALKPHOS 69 -- 75 -- -- 86 67  BILITOT 0.2* -- 0.3 -- -- 0.5 0.8  PROT 4.7* -- 4.6* -- -- 4.7* 4.7*  ALBUMIN 1.5* 1.4* 1.5* 1.6* 1.4* -- --   No results found for this basename: LIPASE:5,AMYLASE:5 in the last 168 hours No results found for this basename: AMMONIA:3 in the last 168 hours  CBC:  Lab 06/22/12 0424 06/21/12 0500 06/20/12 0445 06/19/12  2025 06/19/12 1100 06/19/12 0500 06/17/12 0440 06/15/12 1207  WBC 10.5 10.0 10.5 10.8* -- 16.3* -- --  NEUTROABS 8.3* -- -- 8.3* -- -- 18.9* 21.6*  HGB 9.6* 8.9* 9.0* 8.7* 8.8* -- -- --  HCT 28.8* 27.2* 26.8* 26.1* 26.7* -- -- --  MCV 83.7 82.7 82.0 80.6 -- 81.1 -- --  PLT 295 269 238 215 -- 225 -- --    Cardiac Enzymes: No results found for this basename: CKTOTAL:5,CKMB:5,CKMBINDEX:5,TROPONINI:5 in the last 168 hours  BNP: BNP (last 3 results)  Basename 06/14/12 1818 03/22/12 0912 10/14/11 1203  PROBNP 24481.0* 41.0 83.0     Other results:  Imaging: Dg Chest Port 1 View  06/21/2012  *RADIOLOGY REPORT*  Clinical Data: Cough.  CHF.  PORTABLE CHEST - 1 VIEW  Comparison: 70 08/03/2012 6/3 8:00 a.m.  Findings: Left central line is in place with the tip at the level of the proximal superior vena  cava directed laterally.  This may impress upon the lateral wall of the superior vena cava.  Consider redirecting tip inferiorly to avoid injury.  Consolidation left base may represent infiltrate, atelectasis and / or pleural effusion.  Minimal increased markings right hilar region.  Mild central pulmonary vascular prominence.  Heart size within normal limits. Slightly tortuous aorta.  IMPRESSION: No significant change.  Please see above discussion.  Original Report Authenticated By: Fuller Canada, M.D.   Dg Chest Port 1 View  06/21/2012  *RADIOLOGY REPORT*  Clinical Data: Shortness of breath.  Cough.  Swelling.  PORTABLE CHEST - 1 VIEW  Comparison: 06/19/2012  Findings: The right IJ line has been removed.  The left IJ line tip remains in place, with tip orientation transverse along the right wall of the SVC.  The right costophrenic angle was excluded.  Increased left retrocardiac airspace opacity noted with obscuration of the left hemidiaphragm.  Faint linear opacities noted along the right hemidiaphragm.  Heart size is within normal limits.  Underlying thoracic spondylosis noted.  Faint biapical calcifications are likely from remote inflammatory process.  IMPRESSION:  1.  Increased airspace opacity at the left lung base compatible with atelectasis or pneumonia. 2.  Subsegmental atelectasis at the right lung base. 3.  Right IJ line tip has been removed.  Left IJ line remains in place.  Perpendicular orientation of the left IJ line tip against the right side of the SVC is noted - catheter manipulation to provide for parallel orientation with the SVC may be indicated.  Original Report Authenticated By: Dellia Cloud, M.D.     Medications:     Scheduled Medications:    .  ceFAZolin (ANCEF) IV  1 g Intravenous Q12H  . feeding supplement  237 mL Oral TID BM  . fentaNYL  50 mcg Intravenous Once  . heparin subcutaneous  5,000 Units Subcutaneous Q8H  . insulin aspart  0-20 Units Subcutaneous TID WC   . insulin aspart  0-5 Units Subcutaneous QHS  . insulin aspart  6 Units Subcutaneous TID WC  . insulin glargine  10 Units Subcutaneous Daily  . pantoprazole  40 mg Oral Q1200  . polyethylene glycol  17 g Oral Daily  . senna-docusate  1 tablet Oral QHS  . silver sulfADIAZINE   Topical Daily  . sodium chloride      . DISCONTD: aspirin  325 mg Per Tube Daily    Infusions:    PRN Medications: sodium chloride, acetaminophen, fentaNYL, lidocaine, magnesium sulfate 1 - 4 g bolus IVPB, oxyCODONE, DISCONTD:  morphine injection   Assessment:  1. Mixed cardiomyopathy, baseline ischemic CMP with suspicion of septic cardiomyopathy this admission.  2. CAD S/P stents 3. Prostate Cancer 4. Cellulitis 5. Septic shock from leg source (deep abscess), MSSA 6. HTN 7. DM 8. OSA 9. NSTEMI: Demand ischemia versus ACS, not candidate for invasive evaluation at this time.  10. ARF: Septic shock with ATN   Plan/Discussion:    Volume status elevated. Weight up 19 pounds since admit. Creatinine continues to trend up. 1.8 >2.3>2.73>3.9 >4.7.  I think that the main process driving the renal failure is septic shock with ATN.  Low output could play a role but is probably not principle driver.  I will check a co-ox today.  If significantly decreased, will add low dose dobutamine (avoid milrinone with recent CVVH and rapidly rising creatinine).  Hold on beta blocker. Would not use ace inhibitor or spironolactone due to acute renal failure.      Length of Stay: 8   Keymani  Shirlee Latch 06/22/2012, 10:47 AM

## 2012-06-23 LAB — RENAL FUNCTION PANEL
Albumin: 1.4 g/dL — ABNORMAL LOW (ref 3.5–5.2)
BUN: 62 mg/dL — ABNORMAL HIGH (ref 6–23)
Calcium: 8.2 mg/dL — ABNORMAL LOW (ref 8.4–10.5)
Creatinine, Ser: 5.27 mg/dL — ABNORMAL HIGH (ref 0.50–1.35)
Phosphorus: 7 mg/dL — ABNORMAL HIGH (ref 2.3–4.6)

## 2012-06-23 LAB — CBC
MCV: 83.5 fL (ref 78.0–100.0)
Platelets: 308 10*3/uL (ref 150–400)
RDW: 18.2 % — ABNORMAL HIGH (ref 11.5–15.5)
WBC: 8 10*3/uL (ref 4.0–10.5)

## 2012-06-23 LAB — GLUCOSE, CAPILLARY
Glucose-Capillary: 101 mg/dL — ABNORMAL HIGH (ref 70–99)
Glucose-Capillary: 146 mg/dL — ABNORMAL HIGH (ref 70–99)
Glucose-Capillary: 56 mg/dL — ABNORMAL LOW (ref 70–99)
Glucose-Capillary: 58 mg/dL — ABNORMAL LOW (ref 70–99)
Glucose-Capillary: 61 mg/dL — ABNORMAL LOW (ref 70–99)
Glucose-Capillary: 98 mg/dL (ref 70–99)

## 2012-06-23 LAB — CARBOXYHEMOGLOBIN: O2 Saturation: 59.2 %

## 2012-06-23 LAB — MAGNESIUM: Magnesium: 2.2 mg/dL (ref 1.5–2.5)

## 2012-06-23 MED ORDER — VANCOMYCIN HCL 1000 MG IV SOLR
1500.0000 mg | INTRAVENOUS | Status: DC
  Administered 2012-06-25 – 2012-06-27 (×2): 1500 mg via INTRAVENOUS
  Filled 2012-06-23 (×3): qty 1500

## 2012-06-23 MED ORDER — ONDANSETRON HCL 4 MG/2ML IJ SOLN
4.0000 mg | Freq: Three times a day (TID) | INTRAMUSCULAR | Status: DC | PRN
  Administered 2012-06-23 – 2012-06-24 (×2): 4 mg via INTRAVENOUS
  Filled 2012-06-23 (×2): qty 2

## 2012-06-23 MED ORDER — VANCOMYCIN HCL 1000 MG IV SOLR
2000.0000 mg | Freq: Once | INTRAVENOUS | Status: AC
  Administered 2012-06-23: 2000 mg via INTRAVENOUS
  Filled 2012-06-23: qty 2000

## 2012-06-23 MED ORDER — SODIUM CHLORIDE 0.9 % IJ SOLN
INTRAMUSCULAR | Status: AC
  Filled 2012-06-23: qty 30

## 2012-06-23 MED ORDER — SODIUM CHLORIDE 0.9 % IJ SOLN
INTRAMUSCULAR | Status: AC
  Filled 2012-06-23: qty 10

## 2012-06-23 MED ORDER — FUROSEMIDE 80 MG PO TABS
160.0000 mg | ORAL_TABLET | Freq: Two times a day (BID) | ORAL | Status: AC
  Administered 2012-06-23 (×2): 160 mg via ORAL
  Filled 2012-06-23 (×2): qty 2

## 2012-06-23 MED ORDER — POLYETHYLENE GLYCOL 3350 17 G PO PACK
17.0000 g | PACK | Freq: Once | ORAL | Status: DC
  Filled 2012-06-23: qty 1

## 2012-06-23 NOTE — Progress Notes (Signed)
Name: Wayne [name] MRN: [id number] DOB: 1941/08/29    LOS: 9 Date of admit 06/14/2012 12:48 PM PCP is Kristian Covey, MD Ortho is Dr Jonathon Bellows  Referring Provider:  Dr Preston Fleeting of ER Reason for Referral:  Sepsis, hypotension, acidsis , hyperglycemia  PULMONARY / CRITICAL CARE MEDICINE  71 yr old septic shock, resp failure, leg source. S/p irrigation and debridement of LE on 7/2  Events Since Admission: 7/2- refractory shock post op, acidosis, arf, epi required 7/2 irrigation and debridement of LLE 7/2 2 units PRBC 7/3- Improved pressors 7/5- Off all pressors 7/6 2 units PRBC 7/8>>> SDU  Overnight: No dyspnea, c/o edema/bloating, rash on hands/feet  Non pruritic  Current Status: Stable on SDU , awaiting renal fxn return  Lines/Tubes: ETT 7/1>>7/5 NG Tube 7/1 >>7/5 Foley 7/1>>> RIJ 7/1>>>dc  R Radial 7/1>>>7/4 RPIV 7/1>>>7/4 LPIV 7/1>>> Premrose Drains7/2>>>7/5 L IJ HD catheter 7/4>>  ABX: Vanc 7/1 >>> 7/2 Zosyn 7/1 >>> 7/2 clinda7/2>>>7/3 Linezolid 7/2>>>7/5 Imipenim 7/2>>>7/5 Cefazolin ( MSSA cellulitis/abscess LLE per ID) 7/5 >>>HELD 7/9 d/t rash>>  Vital Signs: Temp:  [97.4 F (36.3 C)-98.3 F (36.8 C)] 98 F (36.7 C) (07/10 0751) Pulse Rate:  [96-102] 98  (07/10 0545) Resp:  [13-19] 16  (07/10 0545) BP: (117-128)/(67-73) 128/73 mmHg (07/10 0355) SpO2:  [95 %-100 %] 96 % (07/10 0545) Weight:  [114.5 kg (252 lb 6.8 oz)] 114.5 kg (252 lb 6.8 oz) (07/10 0355)  Physical Examination: General: NAD HEENT: NCAT, EOMi, HD cath in place PULM: CTA B CV: RRR, no mgr AB: BS+, soft, nontender Ext: 3+ edema, dry dressings, tender LLE Neuro: A&Ox4  Principal Problem:  *Staphylococcus aureus bacteremia with sepsis Active Problems:  HYPERTROPHY PROSTATE W/UR OBST & OTH LUTS  CORONARY ATHEROSCLEROSIS NATIVE CORONARY ARTERY  Polymyalgia rheumatica  Systolic CHF, chronic  OSA (obstructive sleep apnea)  Obesity  Cellulitis and abscess of lower leg   Healthcare-associated pneumonia  Hyperglycemia  Acute encephalopathy  NSTEMI, initial episode of care  AKI (acute kidney injury)   ASSESSMENT AND PLAN  PULMONARY  Lab 06/23/12 0545 06/22/12 1550 06/21/12 0510 06/18/12 0330 06/17/12 0445  PHART -- -- 7.467* 7.475* 7.496*  PCO2ART -- -- 31.9* 32.5* 29.6*  PO2ART -- -- 65.4* 99.4 158.0*  HCO3 -- -- 22.9 23.9 22.8  O2SAT 59.2 57.6 97.0 99.6 100.0    Extubated on RA  ETT: 7/1>>>7/5  A:  Acute Resp Distress due to metabolic acidosis +/- LLL PNA,  r/o ards, not impressed overload. Resolved Doing well since extubation 7/5. No supplemental O2 requirement. Pt w/ h/o OSA.  P:    -on RA,  Monitor for pulmonary edema in setting of poor renal fxn - will need to determine whether he uses CPAP, order if so. Outpt PSG if none done in the past   CARDIOVASCULAR No results found for this basename: TROPONINI:5,LATICACIDVEN:5, O2SATVEN:5,PROBNP:5 in the last 168 hours  Lab 06/23/12 0500 06/22/12 0424 06/21/12 0500  WBC 8.0 10.5 10.0  HGB 9.5* 9.6* 8.9*  HCT 28.4* 28.8* 27.2*  PLT 308 295 269    ECG: no St changes Lines:  RIJ 7/1>>>7/4 L IJ HD 7/4>> LPIV 7/1>>>   A: Severe sepsis/septic shock  d/t staph leg abscess and bacteremia   has resolved.- Pt EF of 45% 2009. Echo  showing EF 15%, diffuse hypo. 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   P:  Per cardiology team and appreciate   RENAL  Lab 06/23/12 0500 06/22/12 0424 06/21/12 0500 06/20/12 0445 06/19/12 2025  06/19/12 0500 06/18/12 1600 06/18/12 0500 06/17/12 0440  NA 136 137 136 135 134* -- -- -- --  K 3.9 4.1 -- -- -- -- -- -- --  CL 99 101 101 101 101 -- -- -- --  CO2 21 22 24 24 25  -- -- -- --  BUN 62* 55* 47* 37* 34* -- -- -- --  CREATININE 5.27* 4.74* 3.92* 2.73* 2.36* -- -- -- --  CALCIUM 8.2* 8.1* 8.0* 7.7* 7.6* -- -- -- --  MG 2.2 -- -- 2.3 -- 2.4 -- 2.2 2.2  PHOS 7.0* 5.9* -- 3.6 -- 3.2 2.3 -- --   Intake/Output      07/09 0701 - 07/10 0700 07/10 0701 -  07/11 0700   P.O. 360    I.V. (mL/kg) 3 (0)    IV Piggyback     Total Intake(mL/kg) 363 (3.2)    Urine (mL/kg/hr) 925 (0.3)    Stool 1    Total Output 926    Net -563          Foley: 7/1>>>  A:  Sepsis induced ATN/ARF slowly better, now in post ATN diuresis phase, not filtering yet  P:   Per renal, No HD today>>if no response to IV lasix may need HD from pulm perspective by 7/11  GASTROINTESTINAL  Lab 06/23/12 0500 06/22/12 0424 06/20/12 0445 06/19/12 2025 06/19/12 0500 06/16/12 1700  AST -- 16 -- 25 -- 35  ALT -- <5 -- 9 -- 30  ALKPHOS -- 69 -- 75 -- 86  BILITOT -- 0.2* -- 0.3 -- 0.5  PROT -- 4.7* -- 4.6* -- 4.7*  ALBUMIN 1.4* 1.5* 1.4* 1.5* 1.6* --    A:  Mild ileus. Abdominal bloating  P:   ppi - continue diet as tolerated  HEMATOLOGIC  Lab 06/23/12 0500 06/22/12 0424 06/21/12 0500 06/20/12 0445 06/19/12 2025 06/19/12 0200 06/18/12 0500  HGB 9.5* 9.6* 8.9* 9.0* 8.7* -- --  HCT 28.4* 28.8* 27.2* 26.8* 26.1* -- --  PLT 308 295 269 238 215 -- --  INR -- -- -- -- -- 1.13 --  APTT -- -- -- -- -- >200* 86*   A:  Hgb stable since transfusion.   P:  - cbc in am    INFECTIOUS  Lab 06/23/12 0500 06/22/12 0424 06/21/12 0500 06/20/12 0445 06/19/12 2025  WBC 8.0 10.5 10.0 10.5 10.8*  PROCALCITON -- -- -- -- --   Cultures: 7/1 BCX: >>> SA pansens 7/1 BCX:>>> SA 7/1 UCX >>>  No growth 7/2 Wound CX: >>> MSSA  ABX:  Per ID SVC Vanc 7/1 >>> 7/2 Zosyn 7/1 >>> 7/2 7/2 clinda>>> 7/3 Linezolid 7/2>>>7/5 Imipenim 7/2>>>7/5 Ancef 7/5>>>HELD for rash   A:  Bacteremia and Sepsis w/ SA that is pan sensitive. Cellulitis LLE w/ Abscess, s/p irriagation and debridement.  Rash now on arms/legs>>>?d/t uremia vs drug rash  P:   abx per ID  ENDOCRINE  Lab 06/23/12 0755 06/22/12 2141 06/22/12 1716 06/22/12 1135 06/22/12 0812  GLUCAP 101* 120* 85 141* 122*   A:  Hyperglycemia likely reaction to sepsis and not DKA . Known diabetic on oral agents. Cortisol is off so will  likely improve. Now on lantus and resistent SSI P:   No oral agents in hospital Continue SSI and Lantus   NEUROLOGIC  A:  Pain control only issue  P:   Prn fentanyl lidoderm patch  BEST PRACTICE / DISPOSITION Level of Care:  SDU Primary Service:  pccm Consultants:  Ortho Dr  Lajoyce Corners , ID, [place] Cards  Code Status:  Full  Diet:  PO DVT Px:  Hep SQ GI Px:  ppi Skin Integrity:  Intact other than LLE Social / Family: present on rounds 7/10 and updated   Austin Miles  [phone]  Cell  [phone]  If no response or cell goes to voicemail, call beeper [phone]  06/23/2012, 10:00 AM

## 2012-06-23 NOTE — Progress Notes (Addendum)
CBG: 61  Treatment: 15 GM carbohydrate snack  Symptoms: Pale and Sweaty  Follow-up CBG: Time: 1800 CBG Result: 58  Possible Reasons for Event: Inadequate meal intake  Comments/MD notified: repeated 15 gram snack CBG recheck 73    Will continue to monitor for further changes   Rosine Door

## 2012-06-23 NOTE — Progress Notes (Signed)
Clinical Social Work Department BRIEF PSYCHOSOCIAL ASSESSMENT 06/23/2012  Patient:  Wayne [name], Wayne [name]     Account Number:  [id number]     Admit date:  06/14/2012  Clinical Social Worker:  Dennison Bulla  Date/Time:  06/23/2012 04:00 PM  Referred by:  Physician  Date Referred:  06/23/2012 Referred for  SNF Placement   Other Referral:   Interview type:  Patient Other interview type:   Wife present    PSYCHOSOCIAL DATA Living Status:  FAMILY Admitted from facility:   Level of care:   Primary support name:  Wayne [name] Primary support relationship to patient:  SPOUSE Degree of support available:   Strong    CURRENT CONCERNS Current Concerns  Post-Acute Placement   Other Concerns:    SOCIAL WORK ASSESSMENT / PLAN CSW received referral to assist with dc planning. CSW reviewed chart which stated PT recommended CIR vs HH for ambulation and SNF for hydrotherapy. CSW met with patient at bedside. Wife was present. Patient agreeable to wife being involved in assessment.    CSW introduced myself and explained role. CSW discussed living arrangements prior to admission. Patient has been living at home with wife who provides 24 hour care. Patient has been ambulating with crutches since April 2013. Patient is aware of PT needs but reports that he wants to wait until closer to dc before making any final decisions. CSW provided family and patient with SNF list. CSW explained Medicare benefits in regards to SNF. Patient was receptive to review information but wishes for CSW to follow up to determine his decision.    CSW will continue to follow.   Assessment/plan status:  Psychosocial Support/Ongoing Assessment of Needs Other assessment/ plan:   Information/referral to community resources:   HH vs CIR vs SNF    PATIENT'S/FAMILY'S RESPONSE TO PLAN OF CARE: Patient was alert and oriented. Patient agreeable to CSW consult but not ready to make a SNF search. Patient agreeable to CSW following up.  Patient was pleasant and engaged throughout assessment. Patient agreeable to wife being involved with dc plan.

## 2012-06-23 NOTE — Progress Notes (Signed)
Advanced Heart Failure Rounding Note   Subjective:    Aspirin and Effient stopped due to hematoma in early May 2013. Admit with LLE calf absess. MSSA bacteremia. Reduced EF noted 15% (from 45%) this admit which was thought to be septic cardiomyopathy. HF medications stopped  May 13 by the patient for unknown reasons. He had been on carvedilol, lisinopril, lasix, spironolactone, and digoxin in the past.    Levophed stopped 7/6 Just stopped CVVH 7/6. Weight up 26 pounds..  I/O -563 liters.   CO-OX 59.2  Fick Cardiac Output  6.7 Cardiac Index 2.8 Creatinine 1.8 >2.3>2.73>3.9 >4.7>5.2  Denies SOB/PND/Orthopnea.  Poor urine output.     Objective:   Weight Range:  Vital Signs:   Temp:  [97.4 F (36.3 C)-98.3 F (36.8 C)] 98 F (36.7 C) (07/10 0355) Pulse Rate:  [96-102] 98  (07/10 0545) Resp:  [13-19] 16  (07/10 0545) BP: (117-128)/(67-73) 128/73 mmHg (07/10 0355) SpO2:  [95 %-100 %] 96 % (07/10 0545) Weight:  [114.5 kg (252 lb 6.8 oz)] 114.5 kg (252 lb 6.8 oz) (07/10 0355) Last BM Date: 06/22/12  Weight change: Filed Weights   06/20/12 0700 06/22/12 0430 06/23/12 0355  Weight: 111.6 kg (246 lb 0.5 oz) 111.4 kg (245 lb 9.5 oz) 114.5 kg (252 lb 6.8 oz)    Intake/Output:   Intake/Output Summary (Last 24 hours) at 06/23/12 0817 Last data filed at 06/22/12 2330  Gross per 24 hour  Intake    363 ml  Output    926 ml  Net   -563 ml     Physical Exam: CVP 2? General:  Chronically ill appearing. No resp difficulty HEENT: normal Neck: supple. JVP difficult. Carotids 2+ bilat; no bruits. No lymphadenopathy or thryomegaly appreciated. Cor: PMI nondisplaced. Regular rate & rhythm. No rubs, or murmurs. S3 Lungs: clear Abdomen: soft, nontender, distended. No hepatosplenomegaly. No bruits or masses. Good bowel sounds. Extremities: no cyanosis, clubbing, rash, LLE ace wrap above knee RLE  2+ edema Neuro: alert & orientedx3, cranial nerves grossly intact. moves all 4 extremities w/o  difficulty. Affect pleasant  Telemetry: SR 98  Labs: Basic Metabolic Panel:  Lab 06/23/12 1610 06/22/12 0424 06/21/12 0500 06/20/12 0445 06/19/12 2025 06/19/12 0500 06/18/12 1600 06/18/12 0500 06/17/12 0440  NA 136 137 136 135 134* -- -- -- --  K 3.9 4.1 3.6 4.1 3.7 -- -- -- --  CL 99 101 101 101 101 -- -- -- --  CO2 21 22 24 24 25  -- -- -- --  GLUCOSE 93 129* 153* 166* 169* -- -- -- --  BUN 62* 55* 47* 37* 34* -- -- -- --  CREATININE 5.27* 4.74* 3.92* 2.73* 2.36* -- -- -- --  CALCIUM 8.2* 8.1* 8.0* -- -- -- -- -- --  MG 2.2 -- -- 2.3 -- 2.4 -- 2.2 2.2  PHOS 7.0* 5.9* -- 3.6 -- 3.2 2.3 -- --    Liver Function Tests:  Lab 06/23/12 0500 06/22/12 0424 06/20/12 0445 06/19/12 2025 06/19/12 0500 06/16/12 1700  AST -- 16 -- 25 -- 35  ALT -- <5 -- 9 -- 30  ALKPHOS -- 69 -- 75 -- 86  BILITOT -- 0.2* -- 0.3 -- 0.5  PROT -- 4.7* -- 4.6* -- 4.7*  ALBUMIN 1.4* 1.5* 1.4* 1.5* 1.6* --   No results found for this basename: LIPASE:5,AMYLASE:5 in the last 168 hours No results found for this basename: AMMONIA:3 in the last 168 hours  CBC:  Lab 06/23/12 0500 06/22/12 0424 06/21/12  0500 06/20/12 0445 06/19/12 2025 06/17/12 0440  WBC 8.0 10.5 10.0 10.5 10.8* --  NEUTROABS -- 8.3* -- -- 8.3* 18.9*  HGB 9.5* 9.6* 8.9* 9.0* 8.7* --  HCT 28.4* 28.8* 27.2* 26.8* 26.1* --  MCV 83.5 83.7 82.7 82.0 80.6 --  PLT 308 295 269 238 215 --    Cardiac Enzymes: No results found for this basename: CKTOTAL:5,CKMB:5,CKMBINDEX:5,TROPONINI:5 in the last 168 hours  BNP: BNP (last 3 results)  Basename 06/14/12 1818 03/22/12 0912 10/14/11 1203  PROBNP 24481.0* 41.0 83.0     Other results:  Imaging: Dg Chest Port 1 View  06/21/2012  *RADIOLOGY REPORT*  Clinical Data: Cough.  CHF.  PORTABLE CHEST - 1 VIEW  Comparison: 70 08/03/2012 6/3 8:00 a.m.  Findings: Left central line is in place with the tip at the level of the proximal superior vena cava directed laterally.  This may impress upon the lateral wall  of the superior vena cava.  Consider redirecting tip inferiorly to avoid injury.  Consolidation left base may represent infiltrate, atelectasis and / or pleural effusion.  Minimal increased markings right hilar region.  Mild central pulmonary vascular prominence.  Heart size within normal limits. Slightly tortuous aorta.  IMPRESSION: No significant change.  Please see above discussion.  Original Report Authenticated By: Fuller Canada, M.D.     Medications:     Scheduled Medications:    .  ceFAZolin (ANCEF) IV  1 g Intravenous Q12H  . feeding supplement  30 mL Oral TID WC  . heparin subcutaneous  5,000 Units Subcutaneous Q8H  . insulin aspart  0-20 Units Subcutaneous TID WC  . insulin aspart  0-5 Units Subcutaneous QHS  . insulin aspart  6 Units Subcutaneous TID WC  . insulin glargine  10 Units Subcutaneous Daily  . pantoprazole  40 mg Oral Q1200  . polyethylene glycol  17 g Oral Daily  . senna-docusate  1 tablet Oral QHS  . silver sulfADIAZINE   Topical Daily  . sodium chloride      . sodium chloride      . sodium chloride      . sodium chloride      . sodium chloride      . DISCONTD: feeding supplement  237 mL Oral TID BM    Infusions:    PRN Medications: sodium chloride, acetaminophen, feeding supplement, fentaNYL, lidocaine, magnesium sulfate 1 - 4 g bolus IVPB, oxyCODONE   Assessment:  1. Mixed cardiomyopathy, baseline ischemic CMP with suspicion of septic cardiomyopathy this admission.  2. CAD S/P stents 3. Prostate Cancer 4. Cellulitis 5. Septic shock from leg source (deep abscess), MSSA 6. HTN 7. DM 8. OSA 9. NSTEMI: Demand ischemia versus ACS, not candidate for invasive evaluation at this time.  10. ARF: Septic shock with ATN 11. Anemia    Plan/Discussion:    Volume status elevated. Weight up 26 pounds since admit. Creatinine continues to trend up. 1.8 >2.3>2.73>3.9 >4.7>5.2. CO-OX 59 with CI 2.8 which should be adequate without inotrope. Hold off on  dopamine/dobutamine. Hold on beta blocker. Would not use ace inhibitor or spironolactone due to acute renal failure.  Urine output is poor but he is comfortable with stable electrolytes.  Would continue to monitor with hope for recovery of renal fxn from ATN.  Renal to see today but do not think there is an absolute need to dialyze him today.     Length of Stay: [address] 06/23/2012, 8:17 AM

## 2012-06-23 NOTE — Progress Notes (Signed)
eLink Physician-Brief Progress Note Patient Name: Wayne [name] DOB: 11-10-1941 MRN: [id number]  Date of Service  06/23/2012   HPI/Events of Note     eICU Interventions  Hold Lantus in am for hypoglycemic episode & vmiting/ poor po intake zofran prn   Intervention Category Intermediate Interventions: Other:  ALVA,RAKESH V. 06/23/2012, 8:38 PM

## 2012-06-23 NOTE — Progress Notes (Signed)
Subjective: Interval History: has complaints swollen.  Objective: Vital signs in last 24 hours: Temp:  [97.4 F (36.3 C)-98.3 F (36.8 C)] 98 F (36.7 C) (07/10 0751) Pulse Rate:  [96-102] 98  (07/10 0545) Resp:  [13-19] 16  (07/10 0545) BP: (117-128)/(67-73) 128/73 mmHg (07/10 0355) SpO2:  [95 %-100 %] 96 % (07/10 0545) Weight:  [114.5 kg (252 lb 6.8 oz)] 114.5 kg (252 lb 6.8 oz) (07/10 0355) Weight change: 3.1 kg (6 lb 13.4 oz)  Intake/Output from previous day: 07/09 0701 - 07/10 0700 In: 363 [P.O.:360; I.V.:3] Out: 926 [Urine:925; Stool:1] Intake/Output this shift:    General appearance: alert and cooperative Resp: diminished breath sounds bilaterally and rales bibasilar Cardio: S1, S2 normal and systolic murmur: holosystolic 2/6, blowing at apex GI: obese,pos bs, liver down 5 cm Extremities: edema 2+ and bandage LLE  Lab Results:  Basename 06/23/12 0500 06/22/12 0424  WBC 8.0 10.5  HGB 9.5* 9.6*  HCT 28.4* 28.8*  PLT 308 295   BMET:  Basename 06/23/12 0500 06/22/12 0424  NA 136 137  K 3.9 4.1  CL 99 101  CO2 21 22  GLUCOSE 93 129*  BUN 62* 55*  CREATININE 5.27* 4.74*  CALCIUM 8.2* 8.1*   No results found for this basename: PTH:2 in the last 72 hours Iron Studies:  Basename 06/22/12 1000  IRON <10*  TIBC Not calculated due to Iron <10.  TRANSFERRIN --  FERRITIN --    Studies/Results: No results found.  I have reviewed the patient's current medications.  Assessment/Plan: 1 ARF/CKD slower rise Cr but no funtion return. Neg I &O but less, will give 2 doses Lasix 2 DM fair control 3 Fasciitis on AB still pain 4 Obesity 5 Anemia stable 6CAD with CM stable  P Lasix, AB     LOS: 9 days   Alean Kromer L 06/23/2012,9:20 AM

## 2012-06-23 NOTE — Progress Notes (Addendum)
Clinical Social Work Department CLINICAL SOCIAL WORK PLACEMENT NOTE 06/23/2012  Patient:  Wayne [name], Wayne [name]  Account Number:  [id number] Admit date:  06/14/2012  Clinical Social Worker:  Unk Lightning, LCSW  Date/time:  06/23/2012 04:00 PM  Clinical Social Work is seeking post-discharge placement for this patient at the following level of care:   SKILLED NURSING   (*CSW will update this form in Epic as items are completed)   06/23/2012  Patient/family provided with [facility] Department of Clinical Social Work's list of facilities offering this level of care within the geographic area requested by the patient (or if unable, by the patient's family).  06/23/2012  Patient/family informed of their freedom to choose among providers that offer the needed level of care, that participate in Medicare, Medicaid or managed care program needed by the patient, have an available bed and are willing to accept the patient.  06/23/2012  Patient/family informed of [facility]' ownership interest in [facility], as well as of the fact that they are under no obligation to receive care at this facility.  PASARR submitted to EDS on 06/24/12 PASARR number received from [employer] on 06/24/12  FL2 transmitted to all facilities in geographic area requested by pt/family on   FL2 transmitted to all facilities within larger geographic area on   Patient informed that his/her managed care company has contracts with or will negotiate with  certain facilities, including the following:     Patient/family informed of bed offers received:   Patient chooses bed at  Physician recommends and patient chooses bed at    Patient to be transferred to  on   Patient to be transferred to facility by   The following physician request were entered in Epic:   Additional Comments:

## 2012-06-23 NOTE — Progress Notes (Addendum)
HYDROTHERAPY EVAL  06/23/12 1230  Subjective Assessment  Subjective Is that blood or what?  Patient and Family Stated Goals Healed up and able to walk  Date of Onset (~2 months ago)  Evaluation and Treatment  Evaluation and Treatment Procedures Explained to Patient/Family Yes  Evaluation and Treatment Procedures agreed to  Incision 06/15/12 Leg Left  Date First Assessed/Time First Assessed: 06/15/12 1328   Location: Leg  Location Orientation: Left  Site / Wound Assessment Clean;Bleeding;Black;Painful;Pale;Pink  Incision Length (cm) 34 cm  (APPROX.)  Margins Attached edges (approximated) (approx. at places and open at others)  Closure Approximated;Sutures  Drainage Amount Moderate  Drainage Description Serosanguineous  Treatment Cleansed  Dressing Type ABD;Compression wrap;Gauze (Comment);Silver dressings  Dressing Clean;Dry;Intact  Wound Therapy - Assess/Plan/Recommendations  Wound Therapy - Clinical Statement Pt's wound has been cleased well, but clotting remains.  Will need selective and/or surgical debridement and likely VAC to possible grafting.  Heavy clotting does not allow percentage of necrotic tissue  Wound Therapy - Functional Problem List Has not used his leg  at his normal level for 2+ months and has used crutches with assist for a month.  Has notbeen able to extend the L knee for at least a month.  Factors Delaying/Impairing Wound Healing Diabetes Mellitus;Immobility  Hydrotherapy Plan Debridement;Dressing change;Patient/family education;Pulsatile lavage with suction  Wound Therapy - Frequency 6X / week  Wound Therapy - Current Recommendations PT;WOC nurse  Wound Therapy - Follow Up Recommendations Skilled nursing facility  Wound Therapy Goals - Improve the function of patient's integumentary system by progressing the wound(s) through the phases of wound healing by:  Decrease Necrotic Tissue to 30  Decrease Necrotic Tissue - Progress Goal set today  Increase Granulation  Tissue to 70 (plus health tissue)  Improve Drainage Characteristics Min;Serous  Improve Drainage Characteristics - Progress Goal set today   06/23/2012  [place] Bing, PT [phone] (pager)

## 2012-06-23 NOTE — Progress Notes (Signed)
ANTIBIOTIC CONSULT NOTE - INITIAL  Pharmacy Consult for Vancomycin Indication: MSSA left leg abscess and bacteremia  Allergies  Allergen Reactions  . Atorvastatin     REACTION: sore legs    Patient Measurements: Height: 5\' 10"  (177.8 cm) Weight: 252 lb 6.8 oz (114.5 kg) IBW/kg (Calculated) : 73   Vital Signs: Temp: 98 F (36.7 C) (07/10 0751) Temp src: Oral (07/10 0751) BP: 128/73 mmHg (07/10 0355) Pulse Rate: 98  (07/10 0545) Intake/Output from previous day: 07/09 0701 - 07/10 0700 In: 363 [P.O.:360; I.V.:3] Out: 926 [Urine:925; Stool:1] Intake/Output from this shift:    Labs:  Basename 06/23/12 0500 06/22/12 0424 06/21/12 0500  WBC 8.0 10.5 10.0  HGB 9.5* 9.6* 8.9*  PLT 308 295 269  LABCREA -- -- --  CREATININE 5.27* 4.74* 3.92*   Estimated Creatinine Clearance: 17 ml/min (by C-G formula based on Cr of 5.27).  Assessment: 7/1: Vanc >>7/2 7/1: Zosyn>>7/2 7/2 Clinda >> 7/3 (d/t Zyvox start- protein inhibition with Zyvox) 7/2: Primaxin>>7/5 7/2: Zyvox>>7/5 7/5 Ancef >> planned for 4 wks total but d/c 7/10 due to rash  7/1 Blood x2: MSSA 7/1: MRSA PCR: negative 7/1: Urine cx: negative 7/2: Wound cx: MSSA 7/5: Blood x2: ngtd 7/6: Blood x2: ngtd   70yom to switch from ancef to vancomycin for ? drug-related rash that developed with ancef. Serum creatnine continues to worsen but no plans for dialysis at this point - hoping ATN will recover soon.  Given weight will use obesity dosing nomogram.  Goal of Therapy:  Vancomycin trough level 15-20 mcg/ml  Plan:  1) Vancomycin 2g IV x 1 2) Vancomycin 1500mg  IV q48 3) Obtain trough at steady state 4) Continue to follow renal function  Fredrik Rigger 06/23/2012,11:43 AM

## 2012-06-23 NOTE — Progress Notes (Signed)
Patient ID: Wayne [name], male   DOB: 21-Dec-1940, 71 y.o.   MRN: [id number]    [facility]    Date of Admission:  06/14/2012           Day 10 antibiotics Principal Problem:  *Staphylococcus aureus bacteremia with sepsis Active Problems:  Cellulitis and abscess of lower leg  HYPERTROPHY PROSTATE W/UR OBST & OTH LUTS  CORONARY ATHEROSCLEROSIS NATIVE CORONARY ARTERY  Polymyalgia rheumatica  Systolic CHF, chronic  OSA (obstructive sleep apnea)  Obesity  Healthcare-associated pneumonia  Hyperglycemia  Acute encephalopathy  NSTEMI, initial episode of care  AKI (acute kidney injury)      .  ceFAZolin (ANCEF) IV  1 g Intravenous Q12H  . feeding supplement  30 mL Oral TID WC  . furosemide  160 mg Oral BID  . heparin subcutaneous  5,000 Units Subcutaneous Q8H  . insulin aspart  0-20 Units Subcutaneous TID WC  . insulin aspart  0-5 Units Subcutaneous QHS  . insulin aspart  6 Units Subcutaneous TID WC  . insulin glargine  10 Units Subcutaneous Daily  . pantoprazole  40 mg Oral Q1200  . polyethylene glycol  17 g Oral Daily  . polyethylene glycol  17 g Oral Once  . senna-docusate  1 tablet Oral QHS  . silver sulfADIAZINE   Topical Daily  . sodium chloride      . sodium chloride      . sodium chloride      . sodium chloride      . sodium chloride      . DISCONTD: feeding supplement  237 mL Oral TID BM    Subjective: He developed a non-pruritic rash last night on his arms and right foot. He feels the rash is "drying up today". Cefazolin was held this morning.  Objective: Temp:  [97.4 F (36.3 C)-98.3 F (36.8 C)] 98 F (36.7 C) (07/10 0751) Pulse Rate:  [96-102] 98  (07/10 0545) Resp:  [13-19] 16  (07/10 0545) BP: (117-128)/(67-73) 128/73 mmHg (07/10 0355) SpO2:  [95 %-100 %] 96 % (07/10 0545) Weight:  [114.5 kg (252 lb 6.8 oz)] 114.5 kg (252 lb 6.8 oz) (07/10 0355)  General: alert and comfortable Skin: Dark red maculopapular lesions on his  posterior upper arms and right foot  Left leg in Ace wrap   Assessment: Not sure if his new rash is a drug rash related to cefazolin but I think it's reasonable to switch him to IV vancomycin.. Although vancomycin is not as active against MSSA as beta lactam agents he is clearly doing better has had negative cultures recently so I do not think this will affect his outcome.  Plan: 1. Change cefazolin to vancomycin 2. I will followup on July 12  Cliffton Asters, MD [facility] [phone] pager   [phone] cell 06/23/2012, 10:49 AM

## 2012-06-23 NOTE — Progress Notes (Signed)
Pt. Wife refused cpap for the pt. She stated that the pt. Does not need cpap tonight and that if he does, she would go home to get one.

## 2012-06-23 NOTE — Progress Notes (Signed)
Patient ID: Wayne [name], male   DOB: Jul 18, 1941, 71 y.o.   MRN: [id number] Wound left leg needs further debridement, will start hydrotherapy today

## 2012-06-23 NOTE — Progress Notes (Signed)
PT Cancellation Note  Treatment cancelled today due to patient's refusal to participate. Spoke with pt and RN regarding pt not feeling well this morning. Will attempt tomorrow pending pts willingness.   06/23/2012 Wayne [name] DPT PAGER: [phone] OFFICE: [phone]    Wayne [name] 06/23/2012, 7:59 AM

## 2012-06-24 ENCOUNTER — Inpatient Hospital Stay (HOSPITAL_COMMUNITY): Payer: Medicare Other

## 2012-06-24 DIAGNOSIS — I798 Other disorders of arteries, arterioles and capillaries in diseases classified elsewhere: Secondary | ICD-10-CM

## 2012-06-24 DIAGNOSIS — E43 Unspecified severe protein-calorie malnutrition: Secondary | ICD-10-CM | POA: Diagnosis not present

## 2012-06-24 LAB — CBC
HCT: 36.7 % — ABNORMAL LOW (ref 39.0–52.0)
MCHC: 33 g/dL (ref 30.0–36.0)
MCV: 83.4 fL (ref 78.0–100.0)
Platelets: 367 10*3/uL (ref 150–400)
RDW: 18.5 % — ABNORMAL HIGH (ref 11.5–15.5)
WBC: 11.9 10*3/uL — ABNORMAL HIGH (ref 4.0–10.5)

## 2012-06-24 LAB — CARBOXYHEMOGLOBIN
Carboxyhemoglobin: 1.5 % (ref 0.5–1.5)
Methemoglobin: 1 % (ref 0.0–1.5)
Methemoglobin: 0.9 % (ref 0.0–1.5)
Total hemoglobin: 13.8 g/dL (ref 13.5–18.0)

## 2012-06-24 LAB — RENAL FUNCTION PANEL
Albumin: 1.6 g/dL — ABNORMAL LOW (ref 3.5–5.2)
BUN: 67 mg/dL — ABNORMAL HIGH (ref 6–23)
Creatinine, Ser: 5.36 mg/dL — ABNORMAL HIGH (ref 0.50–1.35)
GFR calc non Af Amer: 10 mL/min — ABNORMAL LOW (ref >90)
Phosphorus: 8.8 mg/dL — ABNORMAL HIGH (ref 2.3–4.6)

## 2012-06-24 LAB — GLUCOSE, CAPILLARY: Glucose-Capillary: 324 mg/dL — ABNORMAL HIGH (ref 70–99)

## 2012-06-24 MED ORDER — FUROSEMIDE 80 MG PO TABS
160.0000 mg | ORAL_TABLET | Freq: Two times a day (BID) | ORAL | Status: AC
  Administered 2012-06-24 – 2012-06-27 (×6): 160 mg via ORAL
  Filled 2012-06-24 (×6): qty 2

## 2012-06-24 MED ORDER — METOCLOPRAMIDE HCL 5 MG/ML IJ SOLN
5.0000 mg | Freq: Four times a day (QID) | INTRAMUSCULAR | Status: DC
  Administered 2012-06-24 – 2012-06-25 (×4): 5 mg via INTRAVENOUS
  Filled 2012-06-24 (×8): qty 1

## 2012-06-24 MED ORDER — SODIUM BICARBONATE 650 MG PO TABS
1300.0000 mg | ORAL_TABLET | Freq: Two times a day (BID) | ORAL | Status: DC
  Administered 2012-06-24 – 2012-06-26 (×5): 1300 mg via ORAL
  Filled 2012-06-24 (×8): qty 2

## 2012-06-24 MED ORDER — PRO-STAT SUGAR FREE PO LIQD
30.0000 mL | Freq: Two times a day (BID) | ORAL | Status: DC
  Administered 2012-06-24 – 2012-06-29 (×10): 30 mL
  Filled 2012-06-24 (×14): qty 30

## 2012-06-24 MED ORDER — NEPRO/CARBSTEADY PO LIQD
1000.0000 mL | ORAL | Status: DC
  Administered 2012-06-24: 25 mL
  Filled 2012-06-24 (×3): qty 1000

## 2012-06-24 MED ORDER — CALCIUM ACETATE 667 MG PO CAPS
1334.0000 mg | ORAL_CAPSULE | Freq: Three times a day (TID) | ORAL | Status: DC
  Administered 2012-06-24 – 2012-07-02 (×20): 1334 mg via ORAL
  Filled 2012-06-24 (×27): qty 2

## 2012-06-24 MED ORDER — SODIUM CHLORIDE 0.9 % IV SOLN
1020.0000 mg | Freq: Once | INTRAVENOUS | Status: AC
  Administered 2012-06-24: 1020 mg via INTRAVENOUS
  Filled 2012-06-24: qty 34

## 2012-06-24 MED ORDER — DEXTROSE 5 % IV SOLN
160.0000 mg | Freq: Once | INTRAVENOUS | Status: AC
  Administered 2012-06-24: 160 mg via INTRAVENOUS
  Filled 2012-06-24 (×2): qty 16

## 2012-06-24 MED ORDER — NEPRO/CARBSTEADY PO LIQD
1000.0000 mL | ORAL | Status: DC
  Filled 2012-06-24 (×2): qty 1000

## 2012-06-24 MED ORDER — FAMOTIDINE IN NACL 20-0.9 MG/50ML-% IV SOLN
20.0000 mg | INTRAVENOUS | Status: DC
  Administered 2012-06-24: 20 mg via INTRAVENOUS
  Filled 2012-06-24 (×3): qty 50

## 2012-06-24 MED ORDER — METOCLOPRAMIDE HCL 5 MG/ML IJ SOLN: 10.0000 mg | Freq: Four times a day (QID) | INTRAMUSCULAR | Status: DC

## 2012-06-24 MED ORDER — COLLAGENASE 250 UNIT/GM EX OINT
TOPICAL_OINTMENT | Freq: Every day | CUTANEOUS | Status: DC
  Administered 2012-06-25 – 2012-06-29 (×4): via TOPICAL
  Filled 2012-06-24: qty 30

## 2012-06-24 MED ORDER — ENSURE COMPLETE PO LIQD
237.0000 mL | Freq: Two times a day (BID) | ORAL | Status: DC
  Administered 2012-06-24: 237 mL via ORAL

## 2012-06-24 NOTE — Progress Notes (Addendum)
Nutrition Follow-up / Consult  Intervention:    Start TF via NG tube (once placement confirmed) with Nepro at 25 ml/h, increase by 10 ml every 4 hours to goal rate of 55 ml/h with Pro-stat 30 ml BID to provide 2576 kcals, 137 grams protein, 960 ml free water daily.  Discontinue Pro-stat PO TID.  Discontinue Glucerna Shake, patient prefers Ensure.  Ensure Complete PO BID.  Assessment:   Patient has been eating minimally for the past few days due to nausea and emesis.  NG tube was placed this morning for initiation of TF.  Results of KUB are pending.  Patient with an extensive wound on LLE, increasing calorie and protein needs.  PT is providing wound therapy.  Patient is receiving HD as needed.  Phosphorus is trending up.  Diet Order:  Heart Healthy, Glucerna Shake BID prn, Pro-stat 30 ml TID  Meds: Scheduled Meds:   . calcium acetate  1,334 mg Oral TID WC  . collagenase   Topical Daily  . famotidine (PEPCID) IV  20 mg Intravenous Q24H  . feeding supplement (NEPRO CARB STEADY)  1,000 mL Per Tube Q24H  . ferumoxytol  1,020 mg Intravenous Once  . furosemide  160 mg Intravenous Once  . furosemide  160 mg Oral BID  . furosemide  160 mg Oral BID  . heparin subcutaneous  5,000 Units Subcutaneous Q8H  . insulin aspart  0-20 Units Subcutaneous TID WC  . insulin aspart  0-5 Units Subcutaneous QHS  . metoCLOPramide (REGLAN) injection  5 mg Intravenous Q6H  . polyethylene glycol  17 g Oral Daily  . polyethylene glycol  17 g Oral Once  . silver sulfADIAZINE   Topical Daily  . sodium bicarbonate  1,300 mg Oral BID  . sodium chloride      . sodium chloride      . vancomycin  1,500 mg Intravenous Q48H  . vancomycin  2,000 mg Intravenous Once  . DISCONTD: feeding supplement  30 mL Oral TID WC  . DISCONTD: insulin aspart  6 Units Subcutaneous TID WC  . DISCONTD: insulin glargine  10 Units Subcutaneous Daily  . DISCONTD: metoCLOPramide (REGLAN) injection  10 mg Intravenous Q6H  . DISCONTD:  pantoprazole  40 mg Oral Q1200  . DISCONTD: senna-docusate  1 tablet Oral QHS   Continuous Infusions:  PRN Meds:.sodium chloride, acetaminophen, feeding supplement, fentaNYL, lidocaine, magnesium sulfate 1 - 4 g bolus IVPB, ondansetron, oxyCODONE  Labs:  CMP     Component Value Date/Time   NA 139 06/24/2012 0515   K 4.8 06/24/2012 0515   CL 99 06/24/2012 0515   CO2 19 06/24/2012 0515   GLUCOSE 129* 06/24/2012 0515   BUN 67* 06/24/2012 0515   CREATININE 5.36* 06/24/2012 0515   CREATININE 0.78 06/30/2011 1702   CALCIUM 8.5 06/24/2012 0515   PROT 4.7* 06/22/2012 0424   ALBUMIN 1.6* 06/24/2012 0515   AST 16 06/22/2012 0424   ALT <5 06/22/2012 0424   ALKPHOS 69 06/22/2012 0424   BILITOT 0.2* 06/22/2012 0424   GFRNONAA 10* 06/24/2012 0515   GFRAA 11* 06/24/2012 0515    Phosphorus  Date/Time Value Range Status  06/24/2012  5:15 AM 8.8* 2.3 - 4.6 mg/dL Final  4/54/0981  1:91 AM 7.0* 2.3 - 4.6 mg/dL Final  03/21/8294  6:21 AM 5.9* 2.3 - 4.6 mg/dL Final    Potassium  Date/Time Value Range Status  06/24/2012  5:15 AM 4.8  3.5 - 5.1 mEq/L Final  06/23/2012  5:00 AM 3.9  3.5 - 5.1 mEq/L Final  06/22/2012  4:24 AM 4.1  3.5 - 5.1 mEq/L Final    CBG (last 3)   Basename 06/24/12 0802 06/24/12 0012 06/23/12 2155  GLUCAP 142* 117* 98     Intake/Output Summary (Last 24 hours) at 06/24/12 1141 Last data filed at 06/24/12 0809  Gross per 24 hour  Intake      0 ml  Output   1825 ml  Net  -1825 ml    Weight Status:  109.5 kg down from 111.4 kg on 7/9  Re-estimated needs, unchanged:  2300-2500 kcals, 125-140 grams protein, 2.3-2.5 liters fluid daily  Nutrition Dx:  Inadequate oral intake, ongoing.  Goal:  Intake to meet >90% of estimated nutrition needs to support healing, unmet.  Monitor:  TF tolerance, wound healing, labs, weight trend.   Joaquin Courts, RD, CNSC, LDN Pager# [phone] After Hours Pager# [phone]

## 2012-06-24 NOTE — Progress Notes (Deleted)
PT Hydro Note   06/24/12 1258  Incision 06/15/12 Leg Left  Date First Assessed/Time First Assessed: 06/15/12 1328   Location: Leg  Location Orientation: Left  Site / Wound Assessment Bleeding    Newell Coral, PTA [phone]

## 2012-06-24 NOTE — Progress Notes (Signed)
Occupational Therapy Treatment Patient Details Name: Wayne [name] MRN: [id number] DOB: [birth year] Today's Date: 06/24/2012 Time: 7829-5621 OT Time Calculation (min): 53 min  OT Assessment / Plan / Recommendation Comments on Treatment Session Pt willing to participate limited by pain and ability to weight bear on LT LE due to pain. PT with increased HR 146 with static standing    Follow Up Recommendations  Inpatient Rehab    Barriers to Discharge       Equipment Recommendations  Defer to next venue    Recommendations for Other Services Rehab consult  Frequency Min 2X/week   Plan Discharge plan remains appropriate    Precautions / Restrictions Precautions Precautions: Fall   Pertinent Vitals/Pain Pain in Lt LE only with mobility Tenderness at buttock    ADL  Eating/Feeding: Performed;Modified independent Where Assessed - Eating/Feeding: Chair (eating ice in cup with spoon and drinking soda) Lower Body Bathing: Performed;Maximal assistance Where Assessed - Lower Body Bathing: Supported sitting Upper Body Dressing: Performed;Min guard Where Assessed - Upper Body Dressing: Supine, head of bed up Lower Body Dressing: Performed;+1 Total assistance Where Assessed - Lower Body Dressing: Supine, head of bed up Toilet Transfer: Performed;+2 Total assistance Toilet Transfer: Patient Percentage: 30% Toilet Transfer Method: Stand pivot (to the Lt side ) Acupuncturist: Other (comment) (bed pan in chair to simulate 3N1) Toileting - Clothing Manipulation and Hygiene: Performed;+1 Total assistance Equipment Used: Gait belt;Other (comment) (stedy as RW) Transfers/Ambulation Related to ADLs: Pt unable to weight bear on Lt Lt. Pt required pivot transfer to the LT side so pt could actively move Rt LE ADL Comments: Pt with leaking foley on arrival. pt noted to have enlarged scrotum. Pt void of bowel supine and asking "did I go to the bathroom?" Pt unsure of voiding bowel and reports  buttock becoming increasely tender to touch. Pt completed sit<>stand transfer to chair to simulate 3N1 transfer with void of bowel. Bowel movement dark in color and very loose. Pt repositioned in chair to sit up after toileting. Pt c/o discomfort from rash on BIL UE from medication reaction.      OT Goals Acute Rehab OT Goals OT Goal Formulation: With patient Time For Goal Achievement: 07/06/12 Potential to Achieve Goals: Good ADL Goals Pt Will Perform Grooming: with supervision;Standing at sink ADL Goal: Grooming - Progress: Progressing toward goals Pt Will Perform Lower Body Bathing: with min assist;Sit to stand from chair;Sit to stand from bed ADL Goal: Lower Body Bathing - Progress: Progressing toward goals Pt Will Perform Lower Body Dressing: Sit to stand from chair;Sit to stand from bed;with min assist;with adaptive equipment ADL Goal: Lower Body Dressing - Progress: Progressing toward goals Pt Will Transfer to Toilet: with supervision;Ambulation;with DME;3-in-1 ADL Goal: Toilet Transfer - Progress: Progressing toward goals Pt Will Perform Toileting - Clothing Manipulation: with supervision;Standing ADL Goal: Toileting - Clothing Manipulation - Progress: Progressing toward goals Pt Will Perform Toileting - Hygiene: with supervision;with set-up;Sit to stand from 3-in-1/toilet;Sitting on 3-in-1 or toilet ADL Goal: Toileting - Hygiene - Progress: Progressing toward goals Additional ADL Goal #1: Pt will be I with bil UE strengthening HEP in prep for UB use with ADLs  Visit Information  Last OT Received On: 06/24/12 Assistance Needed: +2    Subjective Data      Prior Functioning       Cognition  Overall Cognitive Status: Appears within functional limits for tasks assessed/performed Arousal/Alertness: Awake/alert Orientation Level: Appears intact for tasks assessed Behavior During Session: [facility] for tasks  performed    Mobility Bed Mobility Supine to Sit: 1: +2 Total assist;HOB  elevated (pulling up using Therapist UE) Supine to Sit: Patient Percentage: 40% Sitting - Scoot to Edge of Bed: 1: +2 Total assist (with pad) Sitting - Scoot to Edge of Bed: Patient Percentage: 40% Details for Bed Mobility Assistance: pt required pad use to help with scooting. Pt able to weight shift onto right side to (A) with pad use to scoot LT side closer to EOB Transfers Sit to Stand: 1: +2 Total assist;From elevated surface;From bed Sit to Stand: Patient Percentage: 30% Stand to Sit: 1: +2 Total assist;With upper extremity assist;To chair/3-in-1 Stand to Sit: Patient Percentage: 30% Details for Transfer Assistance: pt unable to weight bear on LT LE           End of Session OT - End of Session Activity Tolerance: Patient limited by pain Patient left: in chair;with call bell/phone within reach;with family/visitor present Nurse Communication: Mobility status (USE HOYER LIFT!)  GO     Lucile Shutters 06/24/2012, 3:50 PM Pager: [phone]

## 2012-06-24 NOTE — Progress Notes (Signed)
OT / PT Note  Pt currently receiving hydrotherapy. OT/ PT to continue to follow and attempt treatment later today   Lucile Shutters   OTR/L Pager: [phone] Office: [phone] .

## 2012-06-24 NOTE — Progress Notes (Signed)
Name: Wayne [name] MRN: [id number] DOB: 07-30-1941    LOS: 10 Date of admit 06/14/2012 12:48 PM PCP is Kristian Covey, MD Ortho is Dr Jonathon Bellows  Referring Provider:  Dr Preston Fleeting of ER Reason for Referral:  Sepsis, hypotension, acidsis , hyperglycemia  PULMONARY / CRITICAL CARE MEDICINE  71 yr old septic shock, resp failure, leg source. S/p irrigation and debridement of LE on 7/2  Events Since Admission: 7/2- refractory shock post op, acidosis, arf, epi required 7/2 irrigation and debridement of LLE 7/2 2 units PRBC 7/3- Improved pressors 7/5- Off all pressors 7/6 2 units PRBC 7/8>>> SDU  Overnight: Nausea and emesis.  Not eating.  Albumin falling.   Neg 1700 cc.  Not absorbing POs  Pos BM  Current Status: No acute distress, not able to eat, emesis ongoing.   Passing gas and BM  Lines/Tubes: ETT 7/1>>7/5 NG Tube 7/1 >>7/5 Foley 7/1>>> RIJ 7/1>>>dc  R Radial 7/1>>>7/4 RPIV 7/1>>>7/4 LPIV 7/1>>> Premrose Drains7/2>>>7/5 L IJ HD catheter 7/4>>  ABX: Vanc 7/1 >>> 7/2 Zosyn 7/1 >>> 7/2 clinda7/2>>>7/3 Linezolid 7/2>>>7/5 Imipenim 7/2>>>7/5 Cefazolin ( MSSA cellulitis/abscess LLE per ID) 7/5 >>>HELD 7/10 d/t rash>>7/10 7/10 Vanco (MSSA, allergic to ceph, per ID)>>  Vital Signs: Temp:  [97.4 F (36.3 C)-98 F (36.7 C)] 97.8 F (36.6 C) (07/11 0800) Pulse Rate:  [62-115] 62  (07/11 0500) Resp:  [19-27] 24  (07/11 0500) BP: (111-151)/(53-89) 128/81 mmHg (07/11 0800) SpO2:  [94 %-98 %] 96 % (07/11 0500) Weight:  [109.5 kg (241 lb 6.5 oz)] 109.5 kg (241 lb 6.5 oz) (07/11 0520)  Physical Examination: General: NAD  C/o nausea.  Not eating  HEENT: NCAT, EOMi, HD cath in place PULM: CTA B CV: RRR, no mgr AB: BS hypoactive , soft, nontender   Ext: 2+ edema, dry dressings, tender LLE Neuro: A&Ox4  Principal Problem:  *Staphylococcus aureus bacteremia with sepsis Active Problems:  Secondary DM with peripheral vascular disease, uncontrolled  HYPERTROPHY PROSTATE W/UR  OBST & OTH LUTS  CORONARY ATHEROSCLEROSIS NATIVE CORONARY ARTERY  Polymyalgia rheumatica  Systolic CHF, chronic  OSA (obstructive sleep apnea)  Obesity  Cellulitis and abscess of lower leg  Healthcare-associated pneumonia  Hyperglycemia  Acute encephalopathy  NSTEMI, initial episode of care  AKI (acute kidney injury)  Protein-calorie malnutrition, severe   ASSESSMENT AND PLAN  PULMONARY  Lab 06/24/12 0543 06/23/12 0545 06/22/12 1550 06/21/12 0510 06/18/12 0330  PHART -- -- -- 7.467* 7.475*  PCO2ART -- -- -- 31.9* 32.5*  PO2ART -- -- -- 65.4* 99.4  HCO3 -- -- -- 22.9 23.9  O2SAT 55.1 59.2 57.6 97.0 99.6    Extubated on RA  ETT: 7/1>>>7/5  A:  Acute Resp Distress due to metabolic acidosis +/- LLL PNA,  r/o ards, not impressed overload. Resolved Doing well since extubation 7/5. No supplemental O2 requirement. Pt w/ h/o OSA.  P:   More lasix IV CXR check    CARDIOVASCULAR No results found for this basename: TROPONINI:5,LATICACIDVEN:5, O2SATVEN:5,PROBNP:5 in the last 168 hours  Lab 06/24/12 0515 06/23/12 0500 06/22/12 0424  WBC 11.9* 8.0 10.5  HGB 12.1* 9.5* 9.6*  HCT 36.7* 28.4* 28.8*  PLT 367 308 295    ECG:  none Lines:  RIJ 7/1>>>7/4 L IJ HD 7/4>> LPIV 7/1>>>   A: Severe sepsis/septic shock  d/t staph leg abscess and bacteremia   has resolved.- Pt EF of 45% 2009. Echo  showing EF 15%, diffuse hypo. 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   P:  Per cardiology team and appreciate Not taking POs.  ?IV Beta blockade.  HR rising.   RENAL  Lab 06/24/12 0515 06/23/12 0500 06/22/12 0424 06/21/12 0500 06/20/12 0445 06/19/12 0500 06/18/12 0500  NA 139 136 137 136 135 -- --  K 4.8 3.9 -- -- -- -- --  CL 99 99 101 101 101 -- --  CO2 19 21 22 24 24  -- --  BUN 67* 62* 55* 47* 37* -- --  CREATININE 5.36* 5.27* 4.74* 3.92* 2.73* -- --  CALCIUM 8.5 8.2* 8.1* 8.0* 7.7* -- --  MG -- 2.2 -- -- 2.3 2.4 2.2  PHOS 8.8* 7.0* 5.9* -- 3.6 3.2 --   Intake/Output       07/10 0701 - 07/11 0700 07/11 0701 - 07/12 0700   P.O.     I.V. (mL/kg)     Total Intake(mL/kg)     Urine (mL/kg/hr) 1700 (0.6) 125   Stool 0    Total Output 1700 125   Net -1700 -125        Stool Occurrence 1 x    Emesis Occurrence 1 x     Foley: 7/1>>>  A:  Sepsis induced ATN/ARF slowly better, now in post ATN diuresis phase, not filtering yet  P:   Per renal,  ?needs HD.  Rising BUN.  ?uremic? As cause for ileus ? IV lasix   ? If absorbing PO lasix  GASTROINTESTINAL  Lab 06/24/12 0515 06/23/12 0500 06/22/12 0424 06/20/12 0445 06/19/12 2025  AST -- -- 16 -- 25  ALT -- -- <5 -- 9  ALKPHOS -- -- 69 -- 75  BILITOT -- -- 0.2* -- 0.3  PROT -- -- 4.7* -- 4.6*  ALBUMIN 1.6* 1.4* 1.5* 1.4* 1.5*    A:  MOD  ileus. Abdominal bloating , not taking POs, severe protein calorie malnutrition, wound not healing, ?element gastroparesis  P:   Transpyloric FT, needs nutrition IV reglan  Chk KUB   HEMATOLOGIC  Lab 06/24/12 0515 06/23/12 0500 06/22/12 0424 06/21/12 0500 06/20/12 0445 06/19/12 0200 06/18/12 0500  HGB 12.1* 9.5* 9.6* 8.9* 9.0* -- --  HCT 36.7* 28.4* 28.8* 27.2* 26.8* -- --  PLT 367 308 295 269 238 -- --  INR -- -- -- -- -- 1.13 --  APTT -- -- -- -- -- >200* 86*   A:  Hgb stable since transfusion.   P:  - cbc in am    INFECTIOUS  Lab 06/24/12 0515 06/23/12 0500 06/22/12 0424 06/21/12 0500 06/20/12 0445  WBC 11.9* 8.0 10.5 10.0 10.5  PROCALCITON -- -- -- -- --   Cultures: 7/1 BCX: >>> SA pansens 7/1 BCX:>>> SA 7/1 UCX >>>  No growth 7/2 Wound CX: >>> MSSA  ABX:  Per ID SVC Vanc 7/1 >>> 7/2 Zosyn 7/1 >>> 7/2 7/2 clinda>>> 7/3 Linezolid 7/2>>>7/5 Imipenim 7/2>>>7/5 Ancef 7/5>>>HELD for rash >>d/c 7/10 Vanco 7/10 (MSSA, ceph allergic)>>  A:  Bacteremia and Sepsis w/ SA that is pan sensitive. Cellulitis LLE w/ Abscess, s/p irriagation and debridement.  Rash now on arms/legs d/t ceph allergy.  P:   abx per ID  ENDOCRINE  Lab 06/24/12 0802  06/24/12 0012 06/23/12 2155 06/23/12 2029 06/23/12 1927  GLUCAP 142* 117* 98 81 71   A:   Cortisol is off so will likely improve.  Now hypoglycemic on lantus  P:   No oral agents in hospital Continue SSI  Stop lantus as not eating    NEUROLOGIC  A:  Pain control only  issue  P:   Prn fentanyl lidoderm patch  BEST PRACTICE / DISPOSITION Level of Care:  SDU Primary Service:  pccm Consultants:  Ortho Dr Lajoyce Corners , ID, [place] [facility]  Code Status:  Full  Diet:  PO>>not eating  ?TP FT DVT Px:  Hep SQ GI Px: change to IV H2 blocker Skin Integrity:  Intact other than LLE Social / Family: spouse present on rounds 7/11 and updated  CC time Austin Miles  [phone]  Cell  [phone]  If no response or cell goes to voicemail, call beeper [phone]  06/24/2012, 8:46 AM

## 2012-06-24 NOTE — Progress Notes (Addendum)
PT Hydrotherapy Note   06/24/12 1258  Subjective Assessment  Subjective Does it look better?  Evaluation and Treatment  Evaluation and Treatment Procedures Explained to Patient/Family Yes  Evaluation and Treatment Procedures agreed to  Incision 06/15/12 Leg Left  Date First Assessed/Time First Assessed: 06/15/12 1328   Location: Leg  Location Orientation: Left  Site / Wound Assessment Bleeding  Incision Width 8.0 cm  Incision Depth 8.5 cm tunnel at center  Incision Length (cm) 28.5 cm  Margins Attached edges (approximated)  Closure Approximated;Sutures  Drainage Amount Moderate  Drainage Description Other (Comment) (Bloody, clots)  Treatment Cleansed  Dressing Type ABD;Compression wrap;Gauze (Comment);Other (Comment) (Silvadene, Kerlix)  Dressing Changed  Wound Therapy - Assess/Plan/Recommendations  Wound Therapy - Clinical Statement Continues with extensive clots.  Hydrotherapy Plan Debridement;Dressing change;Patient/family education;Pulsatile lavage with suction  Wound Therapy - Frequency 6X / week  Wound Therapy - Current Recommendations PT;WOC nurse  Wound Therapy - Follow Up Recommendations Skilled nursing facility  Wound Therapy Goals - Improve the function of patient's integumentary system by progressing the wound(s) through the phases of wound healing by:  Decrease Necrotic Tissue - Progress Progressing toward goal  Increase Granulation Tissue - Progress Progressing toward goal  Improve Drainage Characteristics - Progress Progressing toward goal     Newell Coral, PTA

## 2012-06-24 NOTE — Progress Notes (Signed)
Physical Therapy Treatment Patient Details Name: Wayne [name] MRN: [id number] DOB: [birth year] Today's Date: 06/24/2012 Time: 1914-7829 PT Time Calculation (min): 54 min  PT Assessment / Plan / Recommendation Comments on Treatment Session  Pt limited due to pain this session. He had difficulty bearing any weight through the LLE causing increased assistance with transfer and all weight bearing mobility. Unable to ambulate this session    Follow Up Recommendations  Inpatient Rehab    Barriers to Discharge        Equipment Recommendations  Defer to next venue    Recommendations for Other Services Rehab consult  Frequency Min 3X/week   Plan Discharge plan remains appropriate;Frequency remains appropriate    Precautions / Restrictions Precautions Precautions: Fall       Mobility  Bed Mobility Bed Mobility: Supine to Sit;Sitting - Scoot to Edge of Bed Supine to Sit: 1: +2 Total assist;HOB elevated (pulling up using Therapist UE) Supine to Sit: Patient Percentage: 40% Sitting - Scoot to Edge of Bed: 1: +2 Total assist (with pad) Sitting - Scoot to Edge of Bed: Patient Percentage: 40% Details for Bed Mobility Assistance: pt required pad use to help with scooting. Pt able to weight shift onto right side to (A) with pad use to scoot LT side closer to EOB Transfers Transfers: Sit to Stand;Stand to Sit Sit to Stand: 1: +2 Total assist;From elevated surface;From bed Sit to Stand: Patient Percentage: 30% Stand to Sit: 1: +2 Total assist;With upper extremity assist;To chair/3-in-1 Stand to Sit: Patient Percentage: 30% Stand Pivot Transfers: 1: +2 Total assist Stand Pivot Transfers: Patient Percentage: 30% Details for Transfer Assistance: pt unable to weight bear on LT LE. Attempted stand x 3 prior to transfer from bed to chair. Assist x 2 for safety and support during transfer.  Ambulation/Gait Ambulation/Gait Assistance: Not tested (comment) (pt unable to bear weight during stand)       PT Goals Acute Rehab PT Goals PT Goal: Supine/Side to Sit - Progress: Progressing toward goal PT Goal: Sit to Stand - Progress: Progressing toward goal PT Transfer Goal: Bed to Chair/Chair to Bed - Progress: Progressing toward goal PT Goal: Ambulate - Progress: Progressing toward goal  Visit Information  Last PT Received On: 06/24/12 Assistance Needed: +2 PT/OT Co-Evaluation/Treatment: Yes    Subjective Data      Cognition  Overall Cognitive Status: Appears within functional limits for tasks assessed/performed Arousal/Alertness: Awake/alert Orientation Level: Appears intact for tasks assessed Behavior During Session: [facility] for tasks performed    Balance  Balance Balance Assessed: Yes Static Standing Balance Static Standing - Balance Support: Bilateral upper extremity supported;During functional activity Static Standing - Level of Assistance: 2: Max assist Static Standing - Comment/# of Minutes: pt stood with BUE assist for ~3 minutes during hygiene. Pt unable to bear any weight through LLE and had difficulty maintaining stand for greater than 3 minutes before needing to sit. Max assist for supoprt and stability.  End of Session PT - End of Session Equipment Utilized During Treatment: Gait belt Activity Tolerance: Patient limited by fatigue;Patient limited by pain Patient left: in chair;with call bell/phone within reach Nurse Communication: Mobility status    Wayne [name] 06/24/2012, 4:58 PM

## 2012-06-24 NOTE — Progress Notes (Signed)
Subjective: Interval History: has complaints bs dropped yest and got sick.  Objective: Vital signs in last 24 hours: Temp:  [97.4 F (36.3 C)-98 F (36.7 C)] 97.8 F (36.6 C) (07/11 0800) Pulse Rate:  [62-115] 62  (07/11 0500) Resp:  [19-27] 24  (07/11 0500) BP: (111-151)/(53-89) 128/81 mmHg (07/11 0800) SpO2:  [94 %-98 %] 96 % (07/11 0500) Weight:  [109.5 kg (241 lb 6.5 oz)] 109.5 kg (241 lb 6.5 oz) (07/11 0520) Weight change: -5 kg (-11 lb 0.4 oz)  Intake/Output from previous day: 07/10 0701 - 07/11 0700 In: -  Out: 1700 [Urine:1700] Intake/Output this shift: Total I/O In: -  Out: 125 [Urine:125]  General appearance: alert, cooperative, moderately obese and pale Resp: diminished breath sounds bilaterally Cardio: S1, S2 normal and systolic murmur: holosystolic 2/6, blowing at apex GI: obese, pos bs,live down 6 cm Extremities: edema 3+, L leg wound per wound care and   Lab Results:  Basename 06/24/12 0515 06/23/12 0500  WBC 11.9* 8.0  HGB 12.1* 9.5*  HCT 36.7* 28.4*  PLT 367 308   BMET:  Basename 06/24/12 0515 06/23/12 0500  NA 139 136  K 4.8 3.9  CL 99 99  CO2 19 21  GLUCOSE 129* 93  BUN 67* 62*  CREATININE 5.36* 5.27*  CALCIUM 8.5 8.2*   No results found for this basename: PTH:2 in the last 72 hours Iron Studies:  Basename 06/22/12 1000  IRON <10*  TIBC Not calculated due to Iron <10.  TRANSFERRIN --  FERRITIN --    Studies/Results: Dg Chest Port 1 View  06/24/2012  *RADIOLOGY REPORT*  Clinical Data: Shortness of breath.  PORTABLE CHEST - 1 VIEW  Comparison: 06/21/2012.  Findings: Left central line tip proximal superior vena cava level directed laterally.  This may be against the lateral wall of the superior vena cava although unchanged in position.  This can be redirected inferiorly.  No gross pneumothorax.  Retrocardiac opacity may represent infiltrate, atelectasis and / or pleural effusion.  Slight increased markings right infrahilar region is stable.   Central pulmonary vascular prominence without frank pulmonary edema.  Biapical pleural thickening with calcification on the right.  No associated bony destruction.  Heart size top normal to slightly enlarged.  IMPRESSION: Central pulmonary vascular prominence without frank pulmonary edema.  Retrocardiac opacity may represent infiltrate, atelectasis and / or pleural effusion.  Left central line tip proximal superior vena cava level directed laterally.  This may be against the lateral wall of the superior vena cava although unchanged in position.  This can be redirected inferiorly.  Original Report Authenticated By: Fuller Canada, M.D.   Dg Abd Portable 1v  06/24/2012  *RADIOLOGY REPORT*  Clinical Data: Right abdominal pain, ileus.  PORTABLE ABDOMEN - 1 VIEW  Comparison: None.  Findings: Stomach is distended with air.  Mild gaseous distention of predominantly colon, with some small bowel loops appearing minimally prominent as well.  No unexpected radiopaque calculi.  IMPRESSION: Bowel gas pattern may indicate a mild ileus.  Original Report Authenticated By: Reyes Ivan, M.D.    I have reviewed the patient's current medications.  Assessment/Plan: 1 AKI Cr plateau, diuresing, hopefully ^ GFR soon, still vol xs. Mild acidemia. 2 Anemia Fe given 3 MSSA fasciitis on AB , wound care 4 DM per Primary 5 Obesity 6 CAD?CM stabel  P cont lasix , bicarb P binders, AB    LOS: 10 days   Anylah Scheib L 06/24/2012,10:23 AM

## 2012-06-24 NOTE — Progress Notes (Signed)
06/24/2012 [name] DPT PAGER: [phone] OFFICE: [phone]

## 2012-06-24 NOTE — Progress Notes (Signed)
Advanced Heart Failure Rounding Note   Subjective:    Aspirin and Effient stopped due to hematoma in early May 2013. Admit with LLE calf absess. MSSA bacteremia. Reduced EF noted 15% (from 45%) this admit which was thought to be septic cardiomyopathy. HF medications stopped  May 13 by the patient for unknown reasons. He had been on carvedilol, lisinopril, lasix, spironolactone, and digoxin in the past.   Levophed and CVVH stopped 7/6. Weight up 15 pounds from admission. I/O -1.7 liters, weight down 11 pounds from yesterday  CO-OX 59.2>55.1  Fick Cardiac Output  4.16 Cardiac Index 1.79 Creatinine 1.8 >2.3>2.73>3.9 >4.7>5.2>5.36  Denies SOB/PND/Orthopnea. Improved UOP.  Objective:   Weight Range:  Vital Signs:   Temp:  [97.4 F (36.3 C)-98 F (36.7 C)] 97.8 F (36.6 C) (07/11 0800) Pulse Rate:  [62-115] 62  (07/11 0500) Resp:  [19-27] 24  (07/11 0500) BP: (111-151)/(53-89) 128/81 mmHg (07/11 0800) SpO2:  [94 %-98 %] 96 % (07/11 0500) Weight:  [109.5 kg (241 lb 6.5 oz)] 109.5 kg (241 lb 6.5 oz) (07/11 0520) Last BM Date: 06/23/12  Weight change: Filed Weights   06/22/12 0430 06/23/12 0355 06/24/12 0520  Weight: 111.4 kg (245 lb 9.5 oz) 114.5 kg (252 lb 6.8 oz) 109.5 kg (241 lb 6.5 oz)    Intake/Output:   Intake/Output Summary (Last 24 hours) at 06/24/12 0902 Last data filed at 06/24/12 0809  Gross per 24 hour  Intake      0 ml  Output   1825 ml  Net  -1825 ml     Physical Exam:  General:  Chronically ill appearing. No resp difficulty HEENT: normal Neck: supple. JVP difficult to assess, but appears flat Carotids 2+ bilat; no bruits. No lymphadenopathy or thryomegaly appreciated. Cor: PMI nondisplaced. Regular rate & rhythm. No rubs, or murmurs. S3 Lungs: clear Abdomen: soft, nontender, distended. No hepatosplenomegaly. No bruits or masses. Good bowel sounds x 4 Extremities: no cyanosis, clubbing, rash, LLE ace wrap above knee RLE  2+ edema Neuro: alert & orientedx3,  cranial nerves grossly intact. moves all 4 extremities w/o difficulty. Affect pleasant  Telemetry: ST 120  Labs: Basic Metabolic Panel:  Lab 06/24/12 1610 06/23/12 0500 06/22/12 0424 06/21/12 0500 06/20/12 0445 06/19/12 0500 06/18/12 0500  NA 139 136 137 136 135 -- --  K 4.8 3.9 4.1 3.6 4.1 -- --  CL 99 99 101 101 101 -- --  CO2 19 21 22 24 24  -- --  GLUCOSE 129* 93 129* 153* 166* -- --  BUN 67* 62* 55* 47* 37* -- --  CREATININE 5.36* 5.27* 4.74* 3.92* 2.73* -- --  CALCIUM 8.5 8.2* 8.1* -- -- -- --  MG -- 2.2 -- -- 2.3 2.4 2.2  PHOS 8.8* 7.0* 5.9* -- 3.6 3.2 --    Liver Function Tests:  Lab 06/24/12 0515 06/23/12 0500 06/22/12 0424 06/20/12 0445 06/19/12 2025  AST -- -- 16 -- 25  ALT -- -- <5 -- 9  ALKPHOS -- -- 69 -- 75  BILITOT -- -- 0.2* -- 0.3  PROT -- -- 4.7* -- 4.6*  ALBUMIN 1.6* 1.4* 1.5* 1.4* 1.5*   No results found for this basename: LIPASE:5,AMYLASE:5 in the last 168 hours No results found for this basename: AMMONIA:3 in the last 168 hours  CBC:  Lab 06/24/12 0515 06/23/12 0500 06/22/12 0424 06/21/12 0500 06/20/12 0445 06/19/12 2025  WBC 11.9* 8.0 10.5 10.0 10.5 --  NEUTROABS -- -- 8.3* -- -- 8.3*  HGB 12.1* 9.5* 9.6*  8.9* 9.0* --  HCT 36.7* 28.4* 28.8* 27.2* 26.8* --  MCV 83.4 83.5 83.7 82.7 82.0 --  PLT 367 308 295 269 238 --    Cardiac Enzymes: No results found for this basename: CKTOTAL:5,CKMB:5,CKMBINDEX:5,TROPONINI:5 in the last 168 hours  BNP: BNP (last 3 results)  Basename 06/14/12 1818 03/22/12 0912 10/14/11 1203  PROBNP 24481.0* 41.0 83.0     Other results:  Imaging: No results found.   Medications:     Scheduled Medications:    . famotidine (PEPCID) IV  20 mg Intravenous Q24H  . feeding supplement (NEPRO CARB STEADY)  1,000 mL Per Tube Q24H  . furosemide  160 mg Intravenous Once  . furosemide  160 mg Oral BID  . heparin subcutaneous  5,000 Units Subcutaneous Q8H  . insulin aspart  0-20 Units Subcutaneous TID WC  . insulin  aspart  0-5 Units Subcutaneous QHS  . metoCLOPramide (REGLAN) injection  5 mg Intravenous Q6H  . polyethylene glycol  17 g Oral Daily  . polyethylene glycol  17 g Oral Once  . silver sulfADIAZINE   Topical Daily  . sodium chloride      . sodium chloride      . sodium chloride      . sodium chloride      . vancomycin  1,500 mg Intravenous Q48H  . vancomycin  2,000 mg Intravenous Once  . DISCONTD:  ceFAZolin (ANCEF) IV  1 g Intravenous Q12H  . DISCONTD: feeding supplement  30 mL Oral TID WC  . DISCONTD: insulin aspart  6 Units Subcutaneous TID WC  . DISCONTD: insulin glargine  10 Units Subcutaneous Daily  . DISCONTD: metoCLOPramide (REGLAN) injection  10 mg Intravenous Q6H  . DISCONTD: pantoprazole  40 mg Oral Q1200  . DISCONTD: senna-docusate  1 tablet Oral QHS    Infusions:    PRN Medications: sodium chloride, acetaminophen, feeding supplement, fentaNYL, lidocaine, magnesium sulfate 1 - 4 g bolus IVPB, ondansetron, oxyCODONE   Assessment:  1. Mixed cardiomyopathy, baseline ischemic CMP with suspicion of septic cardiomyopathy this admission.  2. CAD S/P stents 3. Prostate Cancer 4. Cellulitis 5. Septic shock from leg source (deep abscess), MSSA 6. HTN 7. DM 8. OSA 9. NSTEMI: Demand ischemia versus ACS, not candidate for invasive evaluation at this time.  10. ARF: Septic shock with ATN 11. Anemia  12. Nausea, r/o illeus 13. Elbow pain   Plan/Discussion:    Volume status improved after lasix given yesterday. Creatinine increased, but appears to be slowing. Patient appears to be nearing baseline. Diuretics being managed by renal. Do not feel hemodialysis is needed at this time.   Patient complaining of elbow and hand pain, will check uric acid to rule out gout.  He has increased tachycardia this morning, question if low output will recheck co-ox this afternoon and if falls will consider inotrope.   Length of Stay: 10   Aundria Rud 06/24/2012, 9:02 AM  Improved  UOP with Lasix.  Hopefully will turn corner in terms of renal function, awaiting recovery from ATN.  HR in 110s currently, co-ox stable this morning, will follow.  No new cardiac recs at this time.   Marca Ancona 06/24/2012

## 2012-06-24 NOTE — Progress Notes (Signed)
Clinical Social Work  Per progression meeting, patient still not ready for dc. Medical team is still determining plan for patient. CSW previously spoke with family regarding dc plans of HH vs CIR vs SNF vs LTAC. Patient is not ready to make this decision until he is informed of treatment plan. CSW submitted pasarr in preparation for if SNF is needed. CSW will continue to follow patient and work with CM to determine disposition.  [place], [place] [phone]

## 2012-06-25 DIAGNOSIS — A419 Sepsis, unspecified organism: Secondary | ICD-10-CM

## 2012-06-25 DIAGNOSIS — M310 Hypersensitivity angiitis: Secondary | ICD-10-CM

## 2012-06-25 DIAGNOSIS — A4101 Sepsis due to Methicillin susceptible Staphylococcus aureus: Secondary | ICD-10-CM

## 2012-06-25 LAB — CBC
MCH: 27.8 pg (ref 26.0–34.0)
MCHC: 33.2 g/dL (ref 30.0–36.0)
MCV: 83.7 fL (ref 78.0–100.0)
Platelets: 265 10*3/uL (ref 150–400)
RDW: 18.5 % — ABNORMAL HIGH (ref 11.5–15.5)

## 2012-06-25 LAB — DIFFERENTIAL
Basophils Absolute: 0 10*3/uL (ref 0.0–0.1)
Lymphocytes Relative: 10 % — ABNORMAL LOW (ref 12–46)
Lymphs Abs: 0.9 10*3/uL (ref 0.7–4.0)
Monocytes Absolute: 0.6 10*3/uL (ref 0.1–1.0)
Neutro Abs: 7.2 10*3/uL (ref 1.7–7.7)

## 2012-06-25 LAB — RENAL FUNCTION PANEL
Albumin: 1.6 g/dL — ABNORMAL LOW (ref 3.5–5.2)
BUN: 76 mg/dL — ABNORMAL HIGH (ref 6–23)
Chloride: 95 mEq/L — ABNORMAL LOW (ref 96–112)
GFR calc Af Amer: 11 mL/min — ABNORMAL LOW (ref >90)
Phosphorus: 7.8 mg/dL — ABNORMAL HIGH (ref 2.3–4.6)
Potassium: 4.3 mEq/L (ref 3.5–5.1)
Sodium: 135 mEq/L (ref 135–145)

## 2012-06-25 LAB — GLUCOSE, CAPILLARY
Glucose-Capillary: 166 mg/dL — ABNORMAL HIGH (ref 70–99)
Glucose-Capillary: 234 mg/dL — ABNORMAL HIGH (ref 70–99)

## 2012-06-25 LAB — URIC ACID: Uric Acid, Serum: 11.6 mg/dL — ABNORMAL HIGH (ref 4.0–7.8)

## 2012-06-25 LAB — CARBOXYHEMOGLOBIN
Carboxyhemoglobin: 2 % — ABNORMAL HIGH (ref 0.5–1.5)
O2 Saturation: 79.5 %

## 2012-06-25 LAB — CULTURE, BLOOD (ROUTINE X 2): Culture: NO GROWTH

## 2012-06-25 LAB — URINALYSIS, ROUTINE W REFLEX MICROSCOPIC
Bilirubin Urine: NEGATIVE
Glucose, UA: 250 mg/dL — AB
Ketones, ur: NEGATIVE mg/dL
Nitrite: NEGATIVE
Specific Gravity, Urine: 1.011 (ref 1.005–1.030)
pH: 5.5 (ref 5.0–8.0)

## 2012-06-25 LAB — URINE MICROSCOPIC-ADD ON

## 2012-06-25 MED ORDER — INSULIN GLARGINE 100 UNIT/ML ~~LOC~~ SOLN
10.0000 [IU] | Freq: Every day | SUBCUTANEOUS | Status: DC
  Administered 2012-06-25 – 2012-06-26 (×2): 10 [IU] via SUBCUTANEOUS

## 2012-06-25 MED ORDER — HYDROCORTISONE 1 % EX CREA
TOPICAL_CREAM | Freq: Two times a day (BID) | CUTANEOUS | Status: DC
  Administered 2012-06-25 – 2012-06-28 (×7): via TOPICAL
  Administered 2012-06-28: 1 via TOPICAL
  Administered 2012-06-29 – 2012-07-02 (×7): via TOPICAL
  Filled 2012-06-25 (×5): qty 28

## 2012-06-25 MED ORDER — METOCLOPRAMIDE HCL 5 MG/5ML PO SOLN
5.0000 mg | Freq: Three times a day (TID) | ORAL | Status: DC
  Administered 2012-06-25 – 2012-07-02 (×22): 5 mg
  Filled 2012-06-25 (×32): qty 5

## 2012-06-25 MED ORDER — INSULIN ASPART 100 UNIT/ML ~~LOC~~ SOLN: 0.0000 [IU] | Freq: Three times a day (TID) | SUBCUTANEOUS | Status: DC

## 2012-06-25 MED ORDER — RANITIDINE HCL 150 MG/10ML PO SYRP
150.0000 mg | ORAL_SOLUTION | Freq: Every day | ORAL | Status: DC
  Administered 2012-06-25 – 2012-07-01 (×7): 150 mg
  Filled 2012-06-25 (×8): qty 10

## 2012-06-25 MED ORDER — INSULIN ASPART 100 UNIT/ML ~~LOC~~ SOLN
0.0000 [IU] | SUBCUTANEOUS | Status: DC
  Administered 2012-06-25 (×2): 7 [IU] via SUBCUTANEOUS
  Administered 2012-06-25: 4 [IU] via SUBCUTANEOUS
  Administered 2012-06-26 (×2): 7 [IU] via SUBCUTANEOUS
  Administered 2012-06-26: 11 [IU] via SUBCUTANEOUS
  Administered 2012-06-26 (×2): 7 [IU] via SUBCUTANEOUS
  Administered 2012-06-26: 11 [IU] via SUBCUTANEOUS
  Administered 2012-06-27 (×2): 7 [IU] via SUBCUTANEOUS
  Administered 2012-06-27: 4 [IU] via SUBCUTANEOUS
  Administered 2012-06-27: 11 [IU] via SUBCUTANEOUS
  Administered 2012-06-27: 15 [IU] via SUBCUTANEOUS
  Administered 2012-06-27: 7 [IU] via SUBCUTANEOUS
  Administered 2012-06-28 (×2): 4 [IU] via SUBCUTANEOUS
  Administered 2012-06-28: 3 [IU] via SUBCUTANEOUS
  Administered 2012-06-28: 4 [IU] via SUBCUTANEOUS
  Administered 2012-06-28: 3 [IU] via SUBCUTANEOUS
  Administered 2012-06-28: 11 [IU] via SUBCUTANEOUS
  Administered 2012-06-28: 4 [IU] via SUBCUTANEOUS
  Administered 2012-06-29 (×2): 11 [IU] via SUBCUTANEOUS
  Administered 2012-06-30: 4 [IU] via SUBCUTANEOUS
  Administered 2012-06-30: 7 [IU] via SUBCUTANEOUS
  Administered 2012-06-30: 4 [IU] via SUBCUTANEOUS
  Administered 2012-06-30: 7 [IU] via SUBCUTANEOUS
  Administered 2012-07-01: 11 [IU] via SUBCUTANEOUS
  Administered 2012-07-01: 15 [IU] via SUBCUTANEOUS
  Administered 2012-07-01: 4 [IU] via SUBCUTANEOUS
  Administered 2012-07-01: 7 [IU] via SUBCUTANEOUS
  Administered 2012-07-01: 4 [IU] via SUBCUTANEOUS
  Administered 2012-07-01 – 2012-07-02 (×3): 3 [IU] via SUBCUTANEOUS

## 2012-06-25 MED ORDER — INSULIN ASPART 100 UNIT/ML ~~LOC~~ SOLN
3.0000 [IU] | Freq: Three times a day (TID) | SUBCUTANEOUS | Status: DC
  Administered 2012-06-27 – 2012-06-28 (×4): 3 [IU] via SUBCUTANEOUS
  Administered 2012-06-29: 18:00:00 via SUBCUTANEOUS
  Administered 2012-06-29: 3 [IU] via SUBCUTANEOUS

## 2012-06-25 MED ORDER — SILVER NITRATE-POT NITRATE 75-25 % EX MISC
1.0000 | CUTANEOUS | Status: DC | PRN
  Filled 2012-06-25: qty 1

## 2012-06-25 MED ORDER — HYDROCORTISONE SOD SUCCINATE 100 MG IJ SOLR
100.0000 mg | Freq: Four times a day (QID) | INTRAMUSCULAR | Status: DC
  Administered 2012-06-25 – 2012-06-28 (×12): 100 mg via INTRAVENOUS
  Filled 2012-06-25 (×16): qty 2

## 2012-06-25 MED ORDER — NEPRO/CARBSTEADY PO LIQD
1000.0000 mL | Freq: Every day | ORAL | Status: DC
  Administered 2012-06-25 – 2012-06-30 (×4): 1000 mL
  Filled 2012-06-25 (×7): qty 1000

## 2012-06-25 NOTE — Consult Note (Signed)
Reason for Consult:  Rash  Referring Physician: Danise Mina  Wayne [name] is an 71 y.o. male.  HPI: 71yo white male admitted July 1 with Staph abscess on left medial lower leg.  Difficult hospital course complicated by renal and respiratory failure, requiring aggressive ICU support and multiple meds.  During the course of his illness, he has developed a widespread rash that he states "burns".  Denies previous similar skin condition.  Denies previous allergies, although had leg discomfort with a statin drug in the past.  Antibiotic was changed to vancomycin, but rash has persisted and spread.  Patient has general malaise and lethargy as expected, but no other complaints that are specifically related to the rash.     Past Medical History  Diagnosis Date  . DM   . HYPERLIPIDEMIA   . HYPERTENSION   . CORONARY ATHEROSCLEROSIS NATIVE CORONARY ARTERY     a. 01/2011 Cath/PCI: LM nl, LAD 40-50p, D1 80-small, LCX 95-small, RI 90, RCA 100, EF 20%;  b. 01/2011 Card MRI - No transmural scar;  c. 01/2011 PCI RCA->5 Promus DES, RI->3.0x16 Promus DES;  . Herpes zoster ophthalmicus   . Arthritis   . Obesity   . GERD (gastroesophageal reflux disease)   . Ischemic cardiomyopathy     a. 01/2012 Echo EF 45%, mild LVH.  Marland Kitchen Adenocarcinoma of prostate     s/p seed implants  . Polymyalgia rheumatica   . Noncompliance   . OSA (obstructive sleep apnea)   . Hematoma of leg     a. left leg hematoma 03/2012 - came off asa/effient  . Cellulitis of left leg     a. 05/2012 complicated by septic shock    Past Surgical History  Procedure Date  . Prostate surgery     cancer, seed implant  . Coronary stent placement     reports 6 stents placed  . I&d extremity 06/15/2012    Procedure: IRRIGATION AND DEBRIDEMENT EXTREMITY;  Surgeon: Nadara Mustard, MD;  Location: [facility] OR;  Service: Orthopedics;  Laterality: Left;  I&D Left Posterior Knee    Family History  Problem Relation Age of Onset  . Cancer Mother 44    unknown CA  .  Heart disease Father 52    MI, died  . Alcohol abuse Father     Social History:  reports that he quit smoking about 23 years ago. His smoking use included Cigarettes. He has a 6 pack-year smoking history. He has never used smokeless tobacco. He reports that he does not drink alcohol or use illicit drugs.  Allergies:  Allergies  Allergen Reactions  . Atorvastatin     REACTION: sore legs    Medications: I have reviewed the patient's current medications.  Results for orders placed during the hospital encounter of 06/14/12 (from the past 48 hour(s))  GLUCOSE, CAPILLARY     Status: Abnormal   Collection Time   06/23/12 12:46 PM      Component Value Range Comment   Glucose-Capillary 146 (*) 70 - 99 mg/dL    Comment 1 Documented in Chart      Comment 2 Notify RN     GLUCOSE, CAPILLARY     Status: Abnormal   Collection Time   06/23/12  5:36 PM      Component Value Range Comment   Glucose-Capillary 61 (*) 70 - 99 mg/dL    Comment 1 Documented in Chart      Comment 2 Notify RN     GLUCOSE, CAPILLARY  Status: Abnormal   Collection Time   06/23/12  5:55 PM      Component Value Range Comment   Glucose-Capillary 56 (*) 70 - 99 mg/dL   GLUCOSE, CAPILLARY     Status: Abnormal   Collection Time   06/23/12  6:17 PM      Component Value Range Comment   Glucose-Capillary 58 (*) 70 - 99 mg/dL   GLUCOSE, CAPILLARY     Status: Normal   Collection Time   06/23/12  6:40 PM      Component Value Range Comment   Glucose-Capillary 73  70 - 99 mg/dL   GLUCOSE, CAPILLARY     Status: Normal   Collection Time   06/23/12  7:27 PM      Component Value Range Comment   Glucose-Capillary 71  70 - 99 mg/dL    Comment 1 Documented in Chart      Comment 2 Notify RN     GLUCOSE, CAPILLARY     Status: Normal   Collection Time   06/23/12  8:29 PM      Component Value Range Comment   Glucose-Capillary 81  70 - 99 mg/dL    Comment 1 Documented in Chart      Comment 2 Notify RN     GLUCOSE, CAPILLARY      Status: Normal   Collection Time   06/23/12  9:55 PM      Component Value Range Comment   Glucose-Capillary 98  70 - 99 mg/dL    Comment 1 Documented in Chart      Comment 2 Notify RN     GLUCOSE, CAPILLARY     Status: Abnormal   Collection Time   06/24/12 12:12 AM      Component Value Range Comment   Glucose-Capillary 117 (*) 70 - 99 mg/dL   CBC     Status: Abnormal   Collection Time   06/24/12  5:15 AM      Component Value Range Comment   WBC 11.9 (*) 4.0 - 10.5 K/uL    RBC 4.40  4.22 - 5.81 MIL/uL    Hemoglobin 12.1 (*) 13.0 - 17.0 g/dL    HCT 16.1 (*) 09.6 - 52.0 %    MCV 83.4  78.0 - 100.0 fL    MCH 27.5  26.0 - 34.0 pg    MCHC 33.0  30.0 - 36.0 g/dL    RDW 04.5 (*) 40.9 - 15.5 %    Platelets 367  150 - 400 K/uL   RENAL FUNCTION PANEL     Status: Abnormal   Collection Time   06/24/12  5:15 AM      Component Value Range Comment   Sodium 139  135 - 145 mEq/L    Potassium 4.8  3.5 - 5.1 mEq/L    Chloride 99  96 - 112 mEq/L    CO2 19  19 - 32 mEq/L    Glucose, Bld 129 (*) 70 - 99 mg/dL    BUN 67 (*) 6 - 23 mg/dL    Creatinine, Ser 8.11 (*) 0.50 - 1.35 mg/dL    Calcium 8.5  8.4 - 91.4 mg/dL    Phosphorus 8.8 (*) 2.3 - 4.6 mg/dL    Albumin 1.6 (*) 3.5 - 5.2 g/dL    GFR calc non Af Amer 10 (*) >90 mL/min    GFR calc Af Amer 11 (*) >90 mL/min   CARBOXYHEMOGLOBIN     Status: Normal   Collection Time  06/24/12  5:43 AM      Component Value Range Comment   Total hemoglobin 13.8  13.5 - 18.0 g/dL    O2 Saturation 16.1      Carboxyhemoglobin 1.1  0.5 - 1.5 %    Methemoglobin 1.0  0.0 - 1.5 %   GLUCOSE, CAPILLARY     Status: Abnormal   Collection Time   06/24/12  8:02 AM      Component Value Range Comment   Glucose-Capillary 142 (*) 70 - 99 mg/dL    Comment 1 Documented in Chart      Comment 2 Notify RN     CARBOXYHEMOGLOBIN     Status: Abnormal   Collection Time   06/24/12 12:00 PM      Component Value Range Comment   Total hemoglobin 10.0 (*) 13.5 - 18.0 g/dL    O2  Saturation 09.6      Carboxyhemoglobin 1.5  0.5 - 1.5 %    Methemoglobin 0.9  0.0 - 1.5 %   GLUCOSE, CAPILLARY     Status: Abnormal   Collection Time   06/24/12  4:49 PM      Component Value Range Comment   Glucose-Capillary 324 (*) 70 - 99 mg/dL    Comment 1 Documented in Chart      Comment 2 Notify RN     GLUCOSE, CAPILLARY     Status: Abnormal   Collection Time   06/24/12  9:56 PM      Component Value Range Comment   Glucose-Capillary 378 (*) 70 - 99 mg/dL    Comment 1 Documented in Chart      Comment 2 Notify RN     RENAL FUNCTION PANEL     Status: Abnormal   Collection Time   06/25/12  4:15 AM      Component Value Range Comment   Sodium 135  135 - 145 mEq/L    Potassium 4.3  3.5 - 5.1 mEq/L    Chloride 95 (*) 96 - 112 mEq/L    CO2 23  19 - 32 mEq/L    Glucose, Bld 336 (*) 70 - 99 mg/dL    BUN 76 (*) 6 - 23 mg/dL    Creatinine, Ser 0.45 (*) 0.50 - 1.35 mg/dL    Calcium 8.2 (*) 8.4 - 10.5 mg/dL    Phosphorus 7.8 (*) 2.3 - 4.6 mg/dL    Albumin 1.6 (*) 3.5 - 5.2 g/dL    GFR calc non Af Amer 9 (*) >90 mL/min    GFR calc Af Amer 11 (*) >90 mL/min   CBC     Status: Abnormal   Collection Time   06/25/12  4:15 AM      Component Value Range Comment   WBC 9.2  4.0 - 10.5 K/uL    RBC 3.13 (*) 4.22 - 5.81 MIL/uL    Hemoglobin 8.7 (*) 13.0 - 17.0 g/dL DELTA CHECK NOTED   HCT 26.2 (*) 39.0 - 52.0 %    MCV 83.7  78.0 - 100.0 fL    MCH 27.8  26.0 - 34.0 pg    MCHC 33.2  30.0 - 36.0 g/dL    RDW 40.9 (*) 81.1 - 15.5 %    Platelets 265  150 - 400 K/uL DELTA CHECK NOTED  URIC ACID     Status: Abnormal   Collection Time   06/25/12  4:15 AM      Component Value Range Comment   Uric Acid, Serum 11.6 (*) 4.0 -  7.8 mg/dL   DIFFERENTIAL     Status: Abnormal   Collection Time   06/25/12  4:15 AM      Component Value Range Comment   Neutrophils Relative 82 (*) 43 - 77 %    Neutro Abs 7.2  1.7 - 7.7 K/uL    Lymphocytes Relative 10 (*) 12 - 46 %    Lymphs Abs 0.9  0.7 - 4.0 K/uL     Monocytes Relative 7  3 - 12 %    Monocytes Absolute 0.6  0.1 - 1.0 K/uL    Eosinophils Relative 1  0 - 5 %    Eosinophils Absolute 0.1  0.0 - 0.7 K/uL    Basophils Relative 0  0 - 1 %    Basophils Absolute 0.0  0.0 - 0.1 K/uL   CARBOXYHEMOGLOBIN     Status: Abnormal   Collection Time   06/25/12  4:25 AM      Component Value Range Comment   Total hemoglobin 8.6 (*) 13.5 - 18.0 g/dL    O2 Saturation 01.0      Carboxyhemoglobin 2.0 (*) 0.5 - 1.5 %    Methemoglobin 1.9 (*) 0.0 - 1.5 %   GLUCOSE, CAPILLARY     Status: Abnormal   Collection Time   06/25/12  8:15 AM      Component Value Range Comment   Glucose-Capillary 360 (*) 70 - 99 mg/dL    Comment 1 Documented in Chart      Comment 2 Notify RN     URINALYSIS, ROUTINE W REFLEX MICROSCOPIC     Status: Abnormal   Collection Time   06/25/12  9:10 AM      Component Value Range Comment   Color, Urine YELLOW  YELLOW    APPearance CLEAR  CLEAR    Specific Gravity, Urine 1.011  1.005 - 1.030    pH 5.5  5.0 - 8.0    Glucose, UA 250 (*) NEGATIVE mg/dL    Hgb urine dipstick MODERATE (*) NEGATIVE    Bilirubin Urine NEGATIVE  NEGATIVE    Ketones, ur NEGATIVE  NEGATIVE mg/dL    Protein, ur NEGATIVE  NEGATIVE mg/dL    Urobilinogen, UA 0.2  0.0 - 1.0 mg/dL    Nitrite NEGATIVE  NEGATIVE    Leukocytes, UA NEGATIVE  NEGATIVE   URINE MICROSCOPIC-ADD ON     Status: Abnormal   Collection Time   06/25/12  9:10 AM      Component Value Range Comment   Squamous Epithelial / LPF RARE  RARE    WBC, UA 0-2  <3 WBC/hpf    RBC / HPF 0-2  <3 RBC/hpf    Casts GRANULAR CAST (*) NEGATIVE     Dg Chest Port 1 View  06/24/2012  *RADIOLOGY REPORT*  Clinical Data: Shortness of breath.  PORTABLE CHEST - 1 VIEW  Comparison: 06/21/2012.  Findings: Left central line tip proximal superior vena cava level directed laterally.  This may be against the lateral wall of the superior vena cava although unchanged in position.  This can be redirected inferiorly.  No gross  pneumothorax.  Retrocardiac opacity may represent infiltrate, atelectasis and / or pleural effusion.  Slight increased markings right infrahilar region is stable.  Central pulmonary vascular prominence without frank pulmonary edema.  Biapical pleural thickening with calcification on the right.  No associated bony destruction.  Heart size top normal to slightly enlarged.  IMPRESSION: Central pulmonary vascular prominence without frank pulmonary edema.  Retrocardiac opacity  may represent infiltrate, atelectasis and / or pleural effusion.  Left central line tip proximal superior vena cava level directed laterally.  This may be against the lateral wall of the superior vena cava although unchanged in position.  This can be redirected inferiorly.  Original Report Authenticated By: Fuller Canada, M.D.   Dg Abd Portable 1v  06/24/2012  *RADIOLOGY REPORT*  Clinical Data: Panda tube placement.  PORTABLE ABDOMEN - 1 VIEW  Comparison: 06/24/2012.  Findings: 1152 hours.  Feeding tube has been placed, projecting into the descending duodenum.  The visualized bowel gas pattern is normal.  IMPRESSION: Feeding tube tip is in the proximal duodenum.  Original Report Authenticated By: Gerrianne Scale, M.D.   Dg Abd Portable 1v  06/24/2012  *RADIOLOGY REPORT*  Clinical Data: Right abdominal pain, ileus.  PORTABLE ABDOMEN - 1 VIEW  Comparison: None.  Findings: Stomach is distended with air.  Mild gaseous distention of predominantly colon, with some small bowel loops appearing minimally prominent as well.  No unexpected radiopaque calculi.  IMPRESSION: Bowel gas pattern may indicate a mild ileus.  Original Report Authenticated By: Reyes Ivan, M.D.    Review of Systems  Constitutional: Positive for malaise/fatigue.  Eyes: Negative for blurred vision, photophobia and pain.  Skin: Positive for rash.       burns   Blood pressure 123/68, pulse 109, temperature 98.2 F (36.8 C), temperature source Oral, resp. rate 20,  height 5\' 10"  (1.778 m), weight 110 kg (242 lb 8.1 oz), SpO2 95.00%. Physical Exam  Other than the large open wound on the left leg, the skin is remarkable for widespread, discrete and confluent purpuric macules, predominantly on his arms and legs.  There is some involvement of the dorsal hands and feet.  The lesions are nonblanching.  Many of the lesions, especially on his arms, are surmounted with small coalescing vesicles, but no pustules.  The areas are tender with palpation.  The eyes and mouth are clear.  The exam was somewhat limited by concurrent wound debridement, but the unavailable areas are reported to no have different findings.  Assessment/Plan: Vasculitis versus drug eruption.  This rash could represent a drug eruption, and antibiotics are a likely agent.  But the rash has extended after changing the antibiotic to vancomycin.  More likely, this rash represents a leukocytoclastic vasculitis.  Leukocytoclastic vasculitis can also be precipitated by drug reactions, but in this setting, it is probably due to infection.  Shave biopsy of a represented lesion was obtained from his left upper arm and transported to [facility].    If his other medical conditions allow, I would recommend systemic steroids.  Topical application of triamcinolone 0.1% to affected skin bid would also be helpful, but with his significant other nursing demands, not critical, unless he can not tolerate systemic steroids.    Claudie Revering 06/25/2012, 11:13 AM

## 2012-06-25 NOTE — Progress Notes (Signed)
PT Cancellation Note     Treatment cancelled today due to patient's polite refusal to participate due to too painful 06/25/2012  [place] Bing, PT [phone] (pager)

## 2012-06-25 NOTE — Progress Notes (Signed)
Patient ID: Wayne [name], male   DOB: [birth year], 71 y.o.   MRN: [id number] Improved granulation tissue on the posterior flap of the left leg incision, superficial skin necrosis on anterior flap improving, still needs continued hydrotherapy to debride and remove clot, continue santyl to eschar.

## 2012-06-25 NOTE — Progress Notes (Signed)
Patient ID: Wayne [name], male   DOB: June 10, 1941, 71 y.o.   MRN: [id number]    [facility]    Date of Admission:  06/14/2012           Day 12 antibiotics        Day 3 vancomycin Principal Problem:  *Staphylococcus aureus bacteremia with sepsis Active Problems:  Cellulitis and abscess of lower leg  Secondary DM with peripheral vascular disease, uncontrolled  HYPERTROPHY PROSTATE W/UR OBST & OTH LUTS  CORONARY ATHEROSCLEROSIS NATIVE CORONARY ARTERY  Polymyalgia rheumatica  Systolic CHF, chronic  OSA (obstructive sleep apnea)  Obesity  Healthcare-associated pneumonia  Hyperglycemia  Acute encephalopathy  NSTEMI, initial episode of care  AKI (acute kidney injury)  Protein-calorie malnutrition, severe  Leucocytoclastic vasculitis      . calcium acetate  1,334 mg Oral TID WC  . collagenase   Topical Daily  . feeding supplement (NEPRO CARB STEADY)  1,000 mL Per Tube QHS  . feeding supplement  30 mL Per Tube BID  . furosemide  160 mg Oral BID  . heparin subcutaneous  5,000 Units Subcutaneous Q8H  . hydrocortisone cream   Topical BID  . hydrocortisone sodium succinate  100 mg Intravenous Q6H  . insulin aspart  0-20 Units Subcutaneous Q4H  . insulin aspart  3 Units Subcutaneous TID WC  . insulin glargine  10 Units Subcutaneous QHS  . metoCLOPramide  5 mg Per Tube TID AC & HS  . polyethylene glycol  17 g Oral Daily  . polyethylene glycol  17 g Oral Once  . ranitidine  150 mg Per Tube QHS  . silver sulfADIAZINE   Topical Daily  . sodium bicarbonate  1,300 mg Oral BID  . vancomycin  1,500 mg Intravenous Q48H  . DISCONTD: famotidine (PEPCID) IV  20 mg Intravenous Q24H  . DISCONTD: feeding supplement  237 mL Oral BID BM  . DISCONTD: insulin aspart  0-20 Units Subcutaneous TID WC  . DISCONTD: insulin aspart  0-20 Units Subcutaneous TID WC  . DISCONTD: insulin aspart  0-5 Units Subcutaneous QHS  . DISCONTD: metoCLOPramide (REGLAN) injection  5 mg Intravenous  Q6H    Subjective: Wayne rash has become more diffuse and "burns".  Objective: Temp:  [97.4 F (36.3 C)-98.2 F (36.8 C)] 97.4 F (36.3 C) (07/12 1141) Pulse Rate:  [104-114] 106  (07/12 1140) Resp:  [18-22] 18  (07/12 1140) BP: (108-130)/(47-71) 126/71 mmHg (07/12 1140) SpO2:  [91 %-95 %] 95 % (07/12 1140) Weight:  [110 kg (242 lb 8.1 oz)] 110 kg (242 lb 8.1 oz) (07/12 0416)  General: Alert and slightly uncomfortable from leg pain after Wayne recent hydrotherapy. Skin: Diffuse, red maculopapular lesions more prominent on the upper arms and right leg. If you have some superficial vesicles. Lungs: Clear Cor: Regular S1 and S2 no murmurs Left leg in Ace wrap  Lab Results Lab Results  Component Value Date   WBC 9.2 06/25/2012   HGB 8.7* 06/25/2012   HCT 26.2* 06/25/2012   MCV 83.7 06/25/2012   PLT 265 06/25/2012    Lab Results  Component Value Date   CREATININE 5.65* 06/25/2012   BUN 76* 06/25/2012   NA 135 06/25/2012   K 4.3 06/25/2012   CL 95* 06/25/2012   CO2 23 06/25/2012    Lab Results  Component Value Date   ALT <5 06/22/2012   AST 16 06/22/2012   ALKPHOS 69 06/22/2012   BILITOT 0.2* 06/22/2012  Assessment: I agree with Dr. Dorita Sciara assessment and plans for probable leukocytoclastic vasculitis. Since this could of been triggered by the cefazolin he was being treated with up until 2 days ago I will continue vancomycin for now. Wayne MSSA left leg infection and bacteremia have improved.  Plan: 1. Continue vancomycin 2. Situation discussed at length with Wayne [name] and Wayne [name]  Cliffton Asters, MD [facility] [phone] pager   [phone] cell 06/25/2012, 2:20 PM

## 2012-06-25 NOTE — Progress Notes (Signed)
Patient ID: Wayne [name], male   DOB: 05-14-1941, 71 y.o.   MRN: [id number] No pyuria and no eos on diff arguing against AIN aggravating ARF.

## 2012-06-25 NOTE — Progress Notes (Signed)
Hydrotherapy Note  06/25/12 1135  Evaluation and Treatment  Evaluation and Treatment Procedures agreed to  Incision 06/15/12 Leg Left  Date First Assessed/Time First Assessed: 06/15/12 1328   Location: Leg  Location Orientation: Left  Site / Wound Assessment Bleeding;Other (Comment);Red (clot)  Margins Attached edges (approximated)  Closure Approximated;Sutures  Drainage Amount Moderate  Drainage Description Sanguineous  Treatment Cleansed  Dressing Type ABD;Gauze (Comment)  Dressing New drainage;Old drainage (marked)  Wound Therapy - Assess/Plan/Recommendations  Wound Therapy - Clinical Statement Continues with extensive clotting, but much has been removed.  Wound Therapy - Functional Problem List Has not used his leg  at his normal level for 2+ months and has used crutches with assist for a month.  Has notbeen able to extend the L knee for at least a month.  Factors Delaying/Impairing Wound Healing Diabetes Mellitus;Immobility  Hydrotherapy Plan Debridement;Dressing change;Patient/family education;Pulsatile lavage with suction  Wound Therapy - Frequency 6X / week  Wound Therapy - Current Recommendations PT;WOC nurse  Wound Therapy - Follow Up Recommendations Skilled nursing facility  Wound Therapy Goals - Improve the function of patient's integumentary system by progressing the wound(s) through the phases of wound healing by:  Decrease Necrotic Tissue - Progress Progressing toward goal  Increase Granulation Tissue - Progress Progressing toward goal  Improve Drainage Characteristics - Progress Progressing toward goal   06/25/2012  [place]field Bing, PT [phone] (pager)

## 2012-06-25 NOTE — Progress Notes (Signed)
PCCM  I spoke to Para Skeans , who graciously has agreed to see this patient.  I will d/c ACV and start IV Cortisone  ??vasculitic rash??  PT to get a punch bx of skin and I appreciate  Austin Miles  [phone]  Cell  [phone]  If no response or cell goes to voicemail, call beeper [phone]

## 2012-06-25 NOTE — Progress Notes (Signed)
   CARE MANAGEMENT NOTE 06/25/2012  Patient:  Wayne [name], Wayne [name]   Account Number:  [id number]  Date Initiated:  06/15/2012  Documentation initiated by:  [facility]  Subjective/Objective Assessment:   septic, hypotensive.  Has wife.     Action/Plan:   Discharge planning   Anticipated DC Date:  06/22/2012   Anticipated DC Plan:  HOME W HOME HEALTH SERVICES      DC Planning Services  CM consult      [facility] Choice  LONG TERM ACUTE CARE   Choice offered to / List presented to:             Status of service:  In process, will continue to follow Medicare Important Message given?   (If response is "NO", the following Medicare IM given date fields will be blank) Date Medicare IM given:   Date Additional Medicare IM given:    Discharge Disposition:    Per UR Regulation:  Reviewed for med. necessity/level of care/duration of stay  If discussed at Long Length of Stay Meetings, dates discussed:   06/22/2012    Comments:  Contact:  Glasscock,Edith Spouse [phone]   [phone]   06/25/12 1400 Tera Mater, RN, BSN [phone] Received order for LTAC referral.  TC to Boneta Lucks, with [facility], to make referral.  Return call from [place] and she stated pt. would be an appropriate candidate for [facility] and they did have a bed ready.  This NCM spoke with spouse, Rubin Payor, and daughter at bedside to facilitate discharge planning.  Rubin Payor was interested in pt. going to [facility] when his is medically stable.  She is concerned about the rash he has recently developed from the Iv antibiotic he has taken.  TC to Dr. Delford Field to give update on LTAC avaliability.  NCM to continue to follow for discharge needs.   06/22/12- 1018- Donn Pierini RN, BSN [phone] Pt transferred to SDU on 06/21/12, lives at home with wife- will need PT/OT evals to assist with d/c planning  06-18-12 3:28pm Avie Arenas, RNBSN - [phone] Extubated - continues on pressos - weaning - will need PT/OT consult when pressors  off.

## 2012-06-25 NOTE — Progress Notes (Signed)
Name: Wayne [name] MRN: [id number] DOB: 03/21/1941    LOS: 11 Date of admit 06/14/2012 12:48 PM PCP is Kristian Covey, MD Ortho is Dr Jonathon Bellows  Referring Provider:  Dr Preston Fleeting of ER Reason for Referral:  Sepsis, hypotension, acidsis , hyperglycemia  PULMONARY / CRITICAL CARE MEDICINE  71 yr old septic shock, resp failure, leg source. S/p irrigation and debridement of LE on 7/2  Events Since Admission: 7/2- refractory shock post op, acidosis, arf, epi required 7/2 irrigation and debridement of LLE 7/2 2 units PRBC 7/3- Improved pressors 7/5- Off all pressors 7/6 2 units PRBC 7/8>>> SDU  Overnight: No overnight issues   Current Status: No acute distress, eating better with reglan.  Pos BMs.  Pos gas from below.  Has TP Feed tube and TF going.  Wound better on leg.  BS higher.  Rash spreading and is pruritic and appears viral   Lines/Tubes: ETT 7/1>>7/5 NG Tube 7/1 >>7/5 Foley 7/1>>> RIJ 7/1>>>dc  R Radial 7/1>>>7/4 RPIV 7/1>>>7/4 LPIV 7/1>>> Premrose Drains7/2>>>7/5 L IJ HD catheter 7/4>>  ABX: Vanc 7/1 >>> 7/2 Zosyn 7/1 >>> 7/2 clinda7/2>>>7/3 Linezolid 7/2>>>7/5 Imipenim 7/2>>>7/5 Cefazolin ( MSSA cellulitis/abscess LLE per ID) 7/5 >>>HELD 7/10 d/t rash>>7/10 7/10 Vanco (MSSA, allergic to ceph, per ID)>>  Vital Signs: Temp:  [97.8 F (36.6 C)-98.2 F (36.8 C)] 98.2 F (36.8 C) (07/12 0817) Pulse Rate:  [104-146] 109  (07/12 0750) Resp:  [19-22] 20  (07/12 0750) BP: (108-130)/(47-68) 123/68 mmHg (07/12 0750) SpO2:  [91 %-95 %] 95 % (07/12 0750) Weight:  [110 kg (242 lb 8.1 oz)] 110 kg (242 lb 8.1 oz) (07/12 0416)  Physical Examination: General: NAD  C/o nausea.  Not eating  HEENT: NCAT, EOMi, HD cath in place PULM: CTA B CV: RRR, no mgr AB: BS hypoactive , soft, nontender   Ext: 2+ edema, dry dressings, tender LLE Neuro: A&Ox4 Skin: spreading vesicular hemorrhagic rash erythematous, on all areas   Principal Problem:  *Staphylococcus aureus  bacteremia with sepsis Active Problems:  Secondary DM with peripheral vascular disease, uncontrolled  HYPERTROPHY PROSTATE W/UR OBST & OTH LUTS  CORONARY ATHEROSCLEROSIS NATIVE CORONARY ARTERY  Polymyalgia rheumatica  Systolic CHF, chronic  OSA (obstructive sleep apnea)  Obesity  Cellulitis and abscess of lower leg  Healthcare-associated pneumonia  Hyperglycemia  Acute encephalopathy  NSTEMI, initial episode of care  AKI (acute kidney injury)  Protein-calorie malnutrition, severe   ASSESSMENT AND PLAN  PULMONARY  Lab 06/25/12 0425 06/24/12 1200 06/24/12 0543 06/23/12 0545 06/22/12 1550 06/21/12 0510  PHART -- -- -- -- -- 7.467*  PCO2ART -- -- -- -- -- 31.9*  PO2ART -- -- -- -- -- 65.4*  HCO3 -- -- -- -- -- 22.9  O2SAT 79.5 87.1 55.1 59.2 57.6 --     On RA CXR:  7/11:  No edema, lines ok ETT: 7/1>>>7/5  A:  Acute Resp Distress due to metabolic acidosis +/- LLL PNA,  r/o ards, not impressed overload. Resolved Doing well since extubation 7/5. No supplemental O2 requirement. Pt w/ h/o OSA.  P:   Cont lasix to PO     CARDIOVASCULAR No results found for this basename: TROPONINI:5,LATICACIDVEN:5, O2SATVEN:5,PROBNP:5 in the last 168 hours  Lab 06/25/12 0415 06/24/12 0515 06/23/12 0500  WBC 9.2 11.9* 8.0  HGB 8.7* 12.1* 9.5*  HCT 26.2* 36.7* 28.4*  PLT 265 367 308    ECG:  none Lines:  RIJ 7/1>>>7/4 L IJ HD 7/4>> LPIV 7/1>>>   A: Severe sepsis/septic shock  d/t staph leg abscess and bacteremia   has resolved.- Pt EF of 45% 2009. Echo  showing EF 15%, diffuse hypo. 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   P:  Per cardiology team and appreciate   RENAL  Lab 06/25/12 0415 06/24/12 0515 06/23/12 0500 06/22/12 0424 06/21/12 0500 06/20/12 0445 06/19/12 0500  NA 135 139 136 137 136 -- --  K 4.3 4.8 -- -- -- -- --  CL 95* 99 99 101 101 -- --  CO2 23 19 21 22 24  -- --  BUN 76* 67* 62* 55* 47* -- --  CREATININE 5.65* 5.36* 5.27* 4.74* 3.92* -- --  CALCIUM  8.2* 8.5 8.2* 8.1* 8.0* -- --  MG -- -- 2.2 -- -- 2.3 2.4  PHOS 7.8* 8.8* 7.0* 5.9* -- 3.6 --   Intake/Output      07/11 0701 - 07/12 0700 07/12 0701 - 07/13 0700   NG/GT 675    Total Intake(mL/kg) 675 (6.1)    Urine (mL/kg/hr) 1875 (0.7) 450   Stool  1   Total Output 1875 451   Net -1200 -451         Foley: 7/1>>>  A:  Sepsis induced ATN/ARF slowly better, now in post ATN diuresis phase, not filtering yet  P:   Per renal,  Ok on po lasix now, - I/O 1700cc 7/11 No HD as of yet per renal   GASTROINTESTINAL  Lab 06/25/12 0415 06/24/12 0515 06/23/12 0500 06/22/12 0424 06/20/12 0445 06/19/12 2025  AST -- -- -- 16 -- 25  ALT -- -- -- <5 -- 9  ALKPHOS -- -- -- 69 -- 75  BILITOT -- -- -- 0.2* -- 0.3  PROT -- -- -- 4.7* -- 4.6*  ALBUMIN 1.6* 1.6* 1.4* 1.5* 1.4* --   KUB 7/11: mild ileus d/t renal issues, cardiac output  A:  MOD  ileus. Abdominal bloating , not taking POs, severe protein calorie malnutrition, wound not healing, ?element gastroparesis with DM  P:   Transpyloric FT in place,  Change TF to QHS and POs daytime Cont IV reglan change to PO H2 blocker    HEMATOLOGIC  Lab 06/25/12 0415 06/24/12 0515 06/23/12 0500 06/22/12 0424 06/21/12 0500 06/19/12 0200  HGB 8.7* 12.1* 9.5* 9.6* 8.9* --  HCT 26.2* 36.7* 28.4* 28.8* 27.2* --  PLT 265 367 308 295 269 --  INR -- -- -- -- -- 1.13  APTT -- -- -- -- -- >200*   A:  Hgb trend is down  P:  - cbc in am -no tfrx unless Hgb < 7.0    INFECTIOUS  Lab 06/25/12 0415 06/24/12 0515 06/23/12 0500 06/22/12 0424 06/21/12 0500  WBC 9.2 11.9* 8.0 10.5 10.0  PROCALCITON -- -- -- -- --   Cultures: 7/1 BCX: >>> SA pansens 7/1 BCX:>>> SA 7/1 UCX >>>  No growth 7/2 Wound CX: >>> MSSA  ABX:  Per ID SVC Vanc 7/1 >>> 7/2 Zosyn 7/1 >>> 7/2 7/2 clinda>>> 7/3 Linezolid 7/2>>>7/5 Imipenim 7/2>>>7/5 Ancef 7/5>>>HELD for rash >>d/c 7/10 Vanco 7/10 (MSSA, ceph allergic)>>  A:  Bacteremia and Sepsis w/ SA that is pan  sensitive. Cellulitis LLE w/ Abscess, s/p irriagation and debridement.  Rash now on arms/legs ??d/t ceph allergy, possibly viral exanthem?  P:   abx per ID IV acyclovir unless otherwise directed by ID I tried to call Derm [facility] on call, however [facility] stated they will only see established patients .  No community based Dermatology practice available  ENDOCRINE  Lab 06/25/12 0815 06/24/12 2156 06/24/12 1649 06/24/12 0802 06/24/12 0012  GLUCAP 360* 378* 324* 142* 117*   A:   Blood sugars elevated d/t TF, Hx of DM2  P:   Resume Lantus Increase SSI to resistant scale   NEUROLOGIC  A:  Pain control only issue  P:   Prn fentanyl lidoderm patch  BEST PRACTICE / DISPOSITION Level of Care:  SDU Primary Service:  pccm Consultants:  Ortho Dr Lajoyce Corners , ID, [place] [facility]  Code Status:  Full  Diet:  PO daytime, QHS TF DVT Px:  Hep SQ GI Px: PO  H2 blocker Skin Integrity:  Intact other than LLE Social / Family:    CC time Austin Miles  [phone]  Cell  [phone]  If no response or cell goes to voicemail, call beeper [phone]  06/25/2012, 9:15 AM

## 2012-06-25 NOTE — Progress Notes (Signed)
Subjective: Interval History: has complaints stinging rash.  Objective: Vital signs in last 24 hours: Temp:  [97.8 F (36.6 C)-98.2 F (36.8 C)] 98.2 F (36.8 C) (07/12 0817) Pulse Rate:  [104-146] 104  (07/12 0400) Resp:  [19-22] 19  (07/12 0400) BP: (108-130)/(47-65) 121/65 mmHg (07/12 0400) SpO2:  [91 %-95 %] 94 % (07/12 0400) Weight:  [110 kg (242 lb 8.1 oz)] 110 kg (242 lb 8.1 oz) (07/12 0416) Weight change: 0.5 kg (1 lb 1.6 oz)  Intake/Output from previous day: 07/11 0701 - 07/12 0700 In: 675 [NG/GT:675] Out: 1875 [Urine:1875] Intake/Output this shift: Total I/O In: -  Out: 450 [Urine:450]  General appearance: alert, cooperative and pale Resp: diminished breath sounds bilaterally Cardio: S1, S2 normal and systolic murmur: holosystolic 2/6, blowing at apex GI: obese, pos bs, liver down 6 cm Extremities: edema 2+, and incision/ulcers LLE  Lab Results:  [facility] 06/25/12 0415 06/24/12 0515  WBC 9.2 11.9*  HGB 8.7* 12.1*  HCT 26.2* 36.7*  PLT 265 367   BMET:  Basename 06/25/12 0415 06/24/12 0515  NA 135 139  K 4.3 4.8  CL 95* 99  CO2 23 19  GLUCOSE 336* 129*  BUN 76* 67*  CREATININE 5.65* 5.36*  CALCIUM 8.2* 8.5   No results found for this basename: PTH:2 in the last 72 hours Iron Studies:  Basename 06/22/12 1000  IRON <10*  TIBC Not calculated due to Iron <10.  TRANSFERRIN --  FERRITIN --    Studies/Results: Dg Chest Port 1 View  06/24/2012  *RADIOLOGY REPORT*  Clinical Data: Shortness of breath.  PORTABLE CHEST - 1 VIEW  Comparison: 06/21/2012.  Findings: Left central line tip proximal superior vena cava level directed laterally.  This may be against the lateral wall of the superior vena cava although unchanged in position.  This can be redirected inferiorly.  No gross pneumothorax.  Retrocardiac opacity may represent infiltrate, atelectasis and / or pleural effusion.  Slight increased markings right infrahilar region is stable.  Central pulmonary  vascular prominence without frank pulmonary edema.  Biapical pleural thickening with calcification on the right.  No associated bony destruction.  Heart size top normal to slightly enlarged.  IMPRESSION: Central pulmonary vascular prominence without frank pulmonary edema.  Retrocardiac opacity may represent infiltrate, atelectasis and / or pleural effusion.  Left central line tip proximal superior vena cava level directed laterally.  This may be against the lateral wall of the superior vena cava although unchanged in position.  This can be redirected inferiorly.  Original Report Authenticated By: Fuller Canada, M.D.   Dg Abd Portable 1v  06/24/2012  *RADIOLOGY REPORT*  Clinical Data: Panda tube placement.  PORTABLE ABDOMEN - 1 VIEW  Comparison: 06/24/2012.  Findings: 1152 hours.  Feeding tube has been placed, projecting into the descending duodenum.  The visualized bowel gas pattern is normal.  IMPRESSION: Feeding tube tip is in the proximal duodenum.  Original Report Authenticated By: Gerrianne Scale, M.D.   Dg Abd Portable 1v  06/24/2012  *RADIOLOGY REPORT*  Clinical Data: Right abdominal pain, ileus.  PORTABLE ABDOMEN - 1 VIEW  Comparison: None.  Findings: Stomach is distended with air.  Mild gaseous distention of predominantly colon, with some small bowel loops appearing minimally prominent as well.  No unexpected radiopaque calculi.  IMPRESSION: Bowel gas pattern may indicate a mild ileus.  Original Report Authenticated By: Reyes Ivan, M.D.    I have reviewed the patient's current medications.  Assessment/Plan: 1 AKI nonoliguric presumalbly ATN, ?  Role of AIN.  Vol better 2 Rash Ancef, use HC cream 3 DM variable 4 CAD/CM stable 5 Anemia 6OSA 7 Obesity  P D/C foley,follow urine vol, Cr, check diff and UA    LOS: 11 days   Brogen Duell L 06/25/2012,8:27 AM

## 2012-06-25 NOTE — Progress Notes (Signed)
ANTIBIOTIC CONSULT NOTE - FOLLOW UP  Pharmacy Consult for Vancomycin Indication: MSSA bacteremia- suspected rash with Ancef  Allergies  Allergen Reactions  . Atorvastatin     REACTION: sore legs    Patient Measurements: Height: 5\' 10"  (177.8 cm) Weight: 242 lb 8.1 oz (110 kg) IBW/kg (Calculated) : 73    Vital Signs: Temp: 97.4 F (36.3 C) (07/12 1141) Temp src: Oral (07/12 0416) BP: 126/71 mmHg (07/12 1140) Pulse Rate: 106  (07/12 1140) Intake/Output from previous day: 07/11 0701 - 07/12 0700 In: 730 [NG/GT:730] Out: 1875 [Urine:1875] Intake/Output from this shift: Total I/O In: 85 [Other:30; NG/GT:55] Out: 1101 [Urine:1100; Stool:1]  Labs:  [facility] 06/25/12 0415 06/24/12 0515 06/23/12 0500  WBC 9.2 11.9* 8.0  HGB 8.7* 12.1* 9.5*  PLT 265 367 308  LABCREA -- -- --  CREATININE 5.65* 5.36* 5.27*   Estimated Creatinine Clearance: 15.5 ml/min (by C-G formula based on Cr of 5.65). No results found for this basename: VANCOTROUGH:2,VANCOPEAK:2,VANCORANDOM:2,GENTTROUGH:2,GENTPEAK:2,GENTRANDOM:2,TOBRATROUGH:2,TOBRAPEAK:2,TOBRARND:2,AMIKACINPEAK:2,AMIKACINTROU:2,AMIKACIN:2, in the last 72 hours   Assessment: Wayne [name] continues on Vancomycin for MSSA bacteremia- was recently switched back from Ancef d/t rash which is being investigated with shave bx and tx with topical and systemic steroids. Noted ARF/ATN, Scr cont to incr, but good UOP (0.7 ml/kg/hr) with ongoing lasix. TTE neg for endocarditis, no TEE done. WBC is now wnl, pt is afebrile.  7/1: Vanc >>7/2 7/1: Zosyn>>7/2 7/2 Clinda >> 7/3 7/2: Primaxin>>7/5 7/2: Zyvox>>7/5 7/5 Ancef >> planned for 4 wks total but d/c 7/10 due to rash 7/10 Vanc>>  7/1 Blood x2: MSSA 7/1: MRSA PCR: neg 7/2: Wound cx: GPC 7/1: Urine: negative 7/5-6: Blood x4>>neg  Goal of Therapy:  Vancomycin trough level 15-20 mcg/ml  Plan:  - Continue Vancomycin 1500mg  IV q 48h - Will monitor cx/sens, renal fn and clinical status daily. -  Will f/up to check Css trough as appropriate.  Jori Frerichs K. Allena Katz, PharmD, BCPS.  Clinical Pharmacist Pager [phone]. 06/25/2012 12:22 PM

## 2012-06-26 DIAGNOSIS — E43 Unspecified severe protein-calorie malnutrition: Secondary | ICD-10-CM

## 2012-06-26 LAB — BASIC METABOLIC PANEL
BUN: 80 mg/dL — ABNORMAL HIGH (ref 6–23)
CO2: 27 mEq/L (ref 19–32)
Calcium: 8.5 mg/dL (ref 8.4–10.5)
Creatinine, Ser: 5.42 mg/dL — ABNORMAL HIGH (ref 0.50–1.35)
GFR calc non Af Amer: 10 mL/min — ABNORMAL LOW (ref >90)
Glucose, Bld: 214 mg/dL — ABNORMAL HIGH (ref 70–99)
Sodium: 138 mEq/L (ref 135–145)

## 2012-06-26 LAB — GLUCOSE, CAPILLARY
Glucose-Capillary: 223 mg/dL — ABNORMAL HIGH (ref 70–99)
Glucose-Capillary: 284 mg/dL — ABNORMAL HIGH (ref 70–99)

## 2012-06-26 LAB — CBC
Hemoglobin: 7.9 g/dL — ABNORMAL LOW (ref 13.0–17.0)
MCH: 27.3 pg (ref 26.0–34.0)
MCV: 83.4 fL (ref 78.0–100.0)
Platelets: 238 10*3/uL (ref 150–400)
RBC: 2.89 MIL/uL — ABNORMAL LOW (ref 4.22–5.81)
WBC: 6.9 10*3/uL (ref 4.0–10.5)

## 2012-06-26 LAB — CULTURE, BLOOD (ROUTINE X 2): Culture: NO GROWTH

## 2012-06-26 LAB — HEPATIC FUNCTION PANEL
ALT: <5 U/L (ref 0–53)
AST: 14 U/L (ref 0–37)
Alkaline Phosphatase: 67 U/L (ref 39–117)
Bilirubin, Direct: <0.1 mg/dL (ref 0.0–0.3)

## 2012-06-26 LAB — PHOSPHORUS: Phosphorus: 6.9 mg/dL — ABNORMAL HIGH (ref 2.3–4.6)

## 2012-06-26 LAB — CARBOXYHEMOGLOBIN: O2 Saturation: 85.5 %

## 2012-06-26 MED ORDER — DARBEPOETIN ALFA-POLYSORBATE 150 MCG/0.3ML IJ SOLN
150.0000 ug | INTRAMUSCULAR | Status: DC
  Administered 2012-06-26: 150 ug via SUBCUTANEOUS
  Filled 2012-06-26: qty 0.3

## 2012-06-26 MED ORDER — ALLOPURINOL 100 MG PO TABS
200.0000 mg | ORAL_TABLET | Freq: Every day | ORAL | Status: DC
  Administered 2012-06-26 – 2012-07-02 (×6): 200 mg via ORAL
  Filled 2012-06-26 (×7): qty 2

## 2012-06-26 NOTE — Progress Notes (Signed)
Name: Wayne [name] MRN: [id number] DOB: Nov 29, 1941    LOS: 12 Date of admit 06/14/2012 12:48 PM PCP is Kristian Covey, MD Ortho is Dr Jonathon Bellows  Referring Provider:  Dr Preston Fleeting of ER Reason for Referral:  Sepsis, hypotension, acidsis , hyperglycemia  PULMONARY / CRITICAL CARE MEDICINE  71 yr old septic shock, resp failure, leg source. S/p irrigation and debridement of LE on 7/2  Events Since Admission: 7/2- refractory shock post op, acidosis, arf, epi required 7/2 irrigation and debridement of LLE 7/2 2 units PRBC 7/3- Improved pressors 7/5- Off all pressors 7/6 2 units PRBC 7/8>>> SDU   Overnight: No overnight issues   Current Status: No acute distress, eating better with reglan.  Pos BMs.  Pos gas from below.  Has TP Feed tube and TF going.  Wound better on leg.  BS higher.  Rash spreading and is pruritic and appears viral   Lines/Tubes: ETT 7/1>>7/5 NG Tube 7/1 >>7/5 Foley 7/1>>> RIJ 7/1>>>dc  R Radial 7/1>>>7/4 RPIV 7/1>>>7/4 LPIV 7/1>>> Premrose Drains7/2>>>7/5 L IJ HD catheter 7/4>>  ABX: Vanc 7/1 >>> 7/2 Zosyn 7/1 >>> 7/2 clinda7/2>>>7/3 Linezolid 7/2>>>7/5 Imipenim 7/2>>>7/5 Cefazolin ( MSSA cellulitis/abscess LLE per ID) 7/5 >>>HELD 7/10 d/t rash>>7/10 7/10 Vanco (MSSA, allergic to ceph, per ID)>>  Vital Signs: Temp:  [97.4 F (36.3 C)-98.6 F (37 C)] 98.2 F (36.8 C) (07/13 0400) Pulse Rate:  [99-109] 99  (07/13 0015) Resp:  [16-20] 16  (07/13 0400) BP: (111-127)/(50-71) 122/60 mmHg (07/13 0400) SpO2:  [93 %-96 %] 94 % (07/13 0400) Weight:  [110 kg (242 lb 8.1 oz)] 110 kg (242 lb 8.1 oz) (07/13 0400)  Physical Examination: General: NAD  C/o nausea.  Not eating  HEENT: NCAT, EOMi, HD cath in place PULM: CTA B CV: RRR, no mgr AB: BS hypoactive , soft, nontender   Ext: 2+ edema, dry dressings, tender LLE Neuro: A&Ox4 Skin: spreading vesicular hemorrhagic rash erythematous, on all areas   Principal Problem:  *Staphylococcus aureus bacteremia  with sepsis Active Problems:  Secondary DM with peripheral vascular disease, uncontrolled  HYPERTROPHY PROSTATE W/UR OBST & OTH LUTS  CORONARY ATHEROSCLEROSIS NATIVE CORONARY ARTERY  Polymyalgia rheumatica  Systolic CHF, chronic  OSA (obstructive sleep apnea)  Obesity  Cellulitis and abscess of lower leg  Healthcare-associated pneumonia  Hyperglycemia  Acute encephalopathy  NSTEMI, initial episode of care  AKI (acute kidney injury)  Protein-calorie malnutrition, severe  Leucocytoclastic vasculitis   ASSESSMENT AND PLAN  PULMONARY  Lab 06/26/12 0350 06/25/12 0425 06/24/12 1200 06/24/12 0543 06/23/12 0545 06/21/12 0510  PHART -- -- -- -- -- 7.467*  PCO2ART -- -- -- -- -- 31.9*  PO2ART -- -- -- -- -- 65.4*  HCO3 -- -- -- -- -- 22.9  O2SAT 85.5 79.5 87.1 55.1 59.2 --   CXR:  7/11:  No edema, lines ok ETT: 7/1>>>7/5  A:  Acute Resp Distress due to metabolic acidosis +/- LLL PNA,  r/o ards, not impressed overload. Resolved Doing well since extubation 7/5. No supplemental O2 requirement. Pt w/ h/o OSA.  P:   ~on O2 ~ not requiring CPAP & staff notes resting well, no snoring, not restless, etc.     CARDIOVASCULAR No results found for this basename: TROPONINI:5,LATICACIDVEN:5, O2SATVEN:5,PROBNP:5 in the last 168 hours  Lab 06/26/12 0410 06/25/12 0415 06/24/12 0515  WBC 6.9 9.2 11.9*  HGB 7.9* 8.7* 12.1*  HCT 24.1* 26.2* 36.7*  PLT 238 265 367   ECG:  none Lines:  RIJ 7/1>>>7/4 L IJ  HD 7/4>> LPIV 7/1>>>  A: Severe sepsis/septic shock  d/t staph leg abscess and bacteremia (MSSA)-  has resolved.- Pt EF of 45% 2009. Echo  showing EF 15%, diffuse hypo. 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   P:  Per cardiology team and appreciate   RENAL  Lab 06/26/12 0410 06/25/12 0415 06/24/12 0515 06/23/12 0500 06/22/12 0424 06/20/12 0445  NA 138 135 139 136 137 --  K 4.3 4.3 -- -- -- --  CL 98 95* 99 99 101 --  CO2 27 23 19 21 22  --  BUN 80* 76* 67* 62* 55* --    CREATININE 5.42* 5.65* 5.36* 5.27* 4.74* --  CALCIUM 8.5 8.2* 8.5 8.2* 8.1* --  MG -- -- -- 2.2 -- 2.3  PHOS 6.9* 7.8* 8.8* 7.0* 5.9* --   Intake/Output      07/12 0701 - 07/13 0700 07/13 0701 - 07/14 0700   P.O. 720    Other 60    NG/GT 335    IV Piggyback 500    Total Intake(mL/kg) 1615 (14.7)    Urine (mL/kg/hr) 3800 (1.4)    Stool 2    Total Output 3802    Net -2187         Stool Occurrence 3 x     Foley: 7/1>>>  A:  Sepsis induced ATN/ARF slowly better, now in post ATN diuresis phase, not filtering yet  P:   ~ Per renal, no HD yet... ~ on LASIX 160 Q12h    GASTROINTESTINAL  Lab 06/26/12 0410 06/25/12 0415 06/24/12 0515 06/23/12 0500 06/22/12 0424 06/19/12 2025  AST 14 -- -- -- 16 25  ALT <5 -- -- -- <5 9  ALKPHOS 67 -- -- -- 69 75  BILITOT 0.2* -- -- -- 0.2* 0.3  PROT 5.0* -- -- -- 4.7* 4.6*  ALBUMIN 1.6* 1.6* 1.6* 1.4* 1.5* --   KUB 7/11: mild ileus d/t renal issues, cardiac output  A:  MOD  ileus. Abdominal bloating , not taking POs, severe protein calorie malnutrition, wound not healing, ?element gastroparesis with DM  P:   ~ Transpyloric FT in place,  Change TF to QHS and POs daytime ~ Cont IV Reglan, change to PO ~ H2 blocker ~ 7/13> 6-7 stools yest, on TF, will stop the Miralax for now...    HEMATOLOGIC  Lab 06/26/12 0410 06/25/12 0415 06/24/12 0515 06/23/12 0500 06/22/12 0424  HGB 7.9* 8.7* 12.1* 9.5* 9.6*  HCT 24.1* 26.2* 36.7* 28.4* 28.8*  PLT 238 265 367 308 295  INR -- -- -- -- --  APTT -- -- -- -- --   A:  Hgb trend is down  P:  ~ no tfrx unless Hgb < 7.0 ~  7/13> Hg=7.9, following...    INFECTIOUS  Lab 06/26/12 0410 06/25/12 0415 06/24/12 0515 06/23/12 0500 06/22/12 0424  WBC 6.9 9.2 11.9* 8.0 10.5  PROCALCITON -- -- -- -- --   Cultures: 7/1 BCX: >>> SA pansens 7/1 BCX:>>> SA 7/1 UCX >>>  No growth 7/2 Wound CX: >>> MSSA  ABX:  Per ID SVC Vanc 7/1 >>> 7/2 Zosyn 7/1 >>> 7/2 7/2 clinda>>> 7/3 Linezolid  7/2>>>7/5 Imipenim 7/2>>>7/5 Ancef 7/5>>>HELD for rash >>d/c 7/10 Vanco 7/10 (MSSA, ceph allergic)>>  A:  Bacteremia and Sepsis w/ SA that is pan sensitive. Cellulitis LLE w/ Abscess, s/p irriagation and debridement.  Rash now on arms/legs ??d/t ceph allergy, possibly viral exanthem?  P:   abx per ID IV acyclovir unless otherwise directed by  ID I tried to call Derm S[facility] on call> seen by DrLupton, prob Leukocytoclastic vasculitis, bx taken & path pending, on Solucortef 100mg  Q6h   ENDOCRINE  Lab 06/26/12 0341 06/26/12 0002 06/25/12 1943 06/25/12 1635 06/25/12 1159  GLUCAP 223* 207* 204* 234* 166*   A:   Blood sugars elevated d/t TF, Hx of DM2  P:   Resume Lantus==> 10u at present  Increase SSI to resistant scale   NEUROLOGIC  A:  Pain control only issue  P:   Prn fentanyl lidoderm patch  BEST PRACTICE / DISPOSITION Level of Care:  SDU Primary Service:  pccm Consultants:  Ortho Dr Lajoyce Corners , ID, [facility]  Code Status:  Full  Diet:  PO daytime, QHS TF DVT Px:  Hep SQ GI Px: PO  H2 blocker Skin Integrity:  Intact other than LLE Social / Family:     Armond Cuthrell M 06/26/2012, 7:26 AM

## 2012-06-26 NOTE — Progress Notes (Signed)
Patient ID: Wayne [name], male   DOB: 10/22/1941, 71 y.o.   MRN: [id number] Dermatology.  Preliminary path report graciously called in by Raynelle Bring, MD, from [facility].  Skin biopsy confirms leukocytoclastic vasculitis, with few eosinophils.  This suggests the skin eruption is less likely to be drug related, and probably due to infection and/or other ongoing complex medical factors.  Leukocytoclastic vasculitis can also be associated with HIV, Hepatitis B and C,  and autoimmune disorders.  If rash worsens or is unresponsive, consider further workup.  Skin condition should improve with steroid therapy, but resolving blisters may result in many superficial open wounds, compounding the patient's management.

## 2012-06-26 NOTE — Progress Notes (Signed)
Subjective: Interval History: has complaints feels better.  Objective: Vital signs in last 24 hours: Temp:  [97.4 F (36.3 C)-98.6 F (37 C)] 98 F (36.7 C) (07/13 0746) Pulse Rate:  [95-106] 95  (07/13 0746) Resp:  [15-18] 15  (07/13 0746) BP: (111-131)/(50-71) 131/60 mmHg (07/13 0746) SpO2:  [92 %-96 %] 92 % (07/13 0746) Weight:  [110 kg (242 lb 8.1 oz)] 110 kg (242 lb 8.1 oz) (07/13 0400) Weight change: 0 kg (0 lb)  Intake/Output from previous day: 07/12 0701 - 07/13 0700 In: 1635 [P.O.:720; NG/GT:335; IV Piggyback:500] Out: 4127 [Urine:4125; Stool:2] Intake/Output this shift:    General appearance: alert, cooperative, moderately obese and pale Resp: rales bibasilar Cardio: regular rate and rhythm and systolic murmur: holosystolic 2/6, blowing at apex GI: pos bs,liver down 6 cm, soft Extremities: edema 2+, wounds onL calf and  Skin: purpuric rash UE and abdm  Lab Results:  Basename 06/26/12 0410 06/25/12 0415  WBC 6.9 9.2  HGB 7.9* 8.7*  HCT 24.1* 26.2*  PLT 238 265   BMET:  Basename 06/26/12 0410 06/25/12 0415  NA 138 135  K 4.3 4.3  CL 98 95*  CO2 27 23  GLUCOSE 214* 336*  BUN 80* 76*  CREATININE 5.42* 5.65*  CALCIUM 8.5 8.2*   No results found for this basename: PTH:2 in the last 72 hours Iron Studies: No results found for this basename: IRON,TIBC,TRANSFERRIN,FERRITIN in the last 72 hours  Studies/Results: Dg Abd Portable 1v  06/24/2012  *RADIOLOGY REPORT*  Clinical Data: Panda tube placement.  PORTABLE ABDOMEN - 1 VIEW  Comparison: 06/24/2012.  Findings: 1152 hours.  Feeding tube has been placed, projecting into the descending duodenum.  The visualized bowel gas pattern is normal.  IMPRESSION: Feeding tube tip is in the proximal duodenum.  Original Report Authenticated By: Gerrianne Scale, M.D.    I have reviewed the patient's current medications.  Assessment/Plan: 1 AKI nonoliguric diuresing.  Acid/base ok.  K ok.  Concern over receiving vol. Vol  xs 2 Anemia stable 3 DM Lantus ^, control not good 4 CAD/CM stabel 5 Rash ? ANcef given steroid 6 Obesity 7 Fasciitis on Vanco, wound care P Lasix, epo, AB, wound care    LOS: 12 days   Braeden Kennan L 06/26/2012,9:21 AM

## 2012-06-26 NOTE — Progress Notes (Signed)
Physical Therapy - Hydrotherapy Note   06/26/12 1127  Evaluation and Treatment  Evaluation and Treatment Procedures Explained to Patient/Family Yes  Evaluation and Treatment Procedures agreed to  Incision 06/15/12 Leg Left  Date First Assessed/Time First Assessed: 06/15/12 1328   Location: Leg  Location Orientation: Left  Site / Wound Assessment Bleeding;Red;Black  Margins Attached edges (approximated)  Closure Approximated;Sutures  Drainage Amount Moderate  Drainage Description Sanguineous  Dressing Type ABD;Moist to dry;Other (Comment) (Silvadene, Santyl, NS gauze (kerlix), ABD, Kerlix, Ace)  Dressing Changed  Wound Therapy - Assess/Plan/Recommendations  Wound Therapy - Clinical Statement Slow progress continues.  Clotting continues - removed.  Hydrotherapy Plan Debridement;Dressing change;Patient/family education;Pulsatile lavage with suction  Wound Therapy - Frequency 6X / week  Wound Therapy - Follow Up Recommendations Skilled nursing facility  Wound Therapy Goals - Improve the function of patient's integumentary system by progressing the wound(s) through the phases of wound healing by:  Decrease Necrotic Tissue - Progress Progressing toward goal  Increase Granulation Tissue - Progress Progressing toward goal  Improve Drainage Characteristics - Progress Progressing toward goal   Durenda Hurt. Renaldo Fiddler, [facility] Acute Rehab Services Pager [phone]

## 2012-06-26 NOTE — Progress Notes (Signed)
Subjective:  Not SOB.  Nausea improved.  Objective:  Vital Signs in the last 24 hours: BP 131/60  Pulse 95  Temp 98 F (36.7 C) (Oral)  Resp 15  Ht 5\' 10"  (1.778 m)  Wt 110 kg (242 lb 8.1 oz)  BMI 34.80 kg/m2  SpO2 92%  Physical Exam: Pleasant WM in NAD Skin:  Diffuse maculopapular vasculitic rash Lungs:  Clear Cardiac:  RapidRegular rhythm, normal S1 and S2, no S3 Abdomen:  Soft, nontender, no masses Extremities:  2+edema, open wound is wrapped  Intake/Output from previous day: 07/12 0701 - 07/13 0700 In: 1635 [P.O.:720; NG/GT:335; IV Piggyback:500] Out: 4127 [Urine:4125; Stool:2] Weight Filed Weights   06/24/12 0520 06/25/12 0416 06/26/12 0400  Weight: 109.5 kg (241 lb 6.5 oz) 110 kg (242 lb 8.1 oz) 110 kg (242 lb 8.1 oz)    Lab Results: Basic Metabolic Panel:  Basename 06/26/12 0410 06/25/12 0415  NA 138 135  K 4.3 4.3  CL 98 95*  CO2 27 23  GLUCOSE 214* 336*  BUN 80* 76*  CREATININE 5.42* 5.65*    CBC:  Basename 06/26/12 0410 06/25/12 0415  WBC 6.9 9.2  NEUTROABS -- 7.2  HGB 7.9* 8.7*  HCT 24.1* 26.2*  MCV 83.4 83.7  PLT 238 265   BNP    Component Value Date/Time   PROBNP 24481.0* 06/14/2012 1818   Telemetry:  Sinus tachycardia with PAC's  Assessment/Plan:  1. Acute systolic CHF with EF 15% ?septic cardiomyopathy volume status stable 2. Acute renal failure plateaued and nonoliguric 3. Leukocytoclastic vasculitis 4. Septic shock due to staph bacteremia 5. CAD  Rec:  Clinically is improved.  Creatinine is flat with no further rise.  W/u of skin issues at hand.  Volume status better.  No ACE or spironolactone now.  Darden Palmer  MD [facility] Card[facility]  06/26/2012, 10:27 AM

## 2012-06-26 NOTE — Progress Notes (Signed)
Patient refused cpap for tonight. RT will continue to monitor. 

## 2012-06-26 NOTE — Progress Notes (Signed)
INFECTIOUS DISEASE PROGRESS NOTE  ID: Wayne [name] is a 71 y.o. male with   Principal Problem:  *Staphylococcus aureus bacteremia with sepsis Active Problems:  Secondary DM with peripheral vascular disease, uncontrolled  HYPERTROPHY PROSTATE W/UR OBST & OTH LUTS  CORONARY ATHEROSCLEROSIS NATIVE CORONARY ARTERY  Polymyalgia rheumatica  Systolic CHF, chronic  OSA (obstructive sleep apnea)  Obesity  Cellulitis and abscess of lower leg  Healthcare-associated pneumonia  Hyperglycemia  Acute encephalopathy  NSTEMI, initial episode of care  AKI (acute kidney injury)  Protein-calorie malnutrition, severe  Leucocytoclastic vasculitis  Subjective: Without complaints. appetite better. Some concern about FSG in 300s.  Abtx:  Anti-infectives     Start     Dose/Rate Route Frequency Ordered Stop   06/25/12 1300   vancomycin (VANCOCIN) 1,500 mg in sodium chloride 0.9 % 500 mL IVPB        1,500 mg 250 mL/hr over 120 Minutes Intravenous Every 48 hours 06/23/12 1153     06/23/12 1300   vancomycin (VANCOCIN) 2,000 mg in sodium chloride 0.9 % 500 mL IVPB        2,000 mg 250 mL/hr over 120 Minutes Intravenous  Once 06/23/12 1153 06/23/12 1444   06/21/12 2200   ceFAZolin (ANCEF) IVPB 1 g/50 mL premix  Status:  Discontinued        1 g 100 mL/hr over 30 Minutes Intravenous Every 12 hours 06/21/12 0941 06/23/12 1056   06/18/12 1000   ceFAZolin (ANCEF) IVPB 2 g/50 mL premix  Status:  Discontinued        2 g 100 mL/hr over 30 Minutes Intravenous Every 12 hours 06/18/12 0910 06/21/12 0941   06/17/12 1800   imipenem-cilastatin (PRIMAXIN) 500 mg in sodium chloride 0.9 % 100 mL IVPB  Status:  Discontinued        500 mg 200 mL/hr over 30 Minutes Intravenous 4 times per day 06/17/12 1426 06/18/12 0909   06/16/12 0000   imipenem-cilastatin (PRIMAXIN) 250 mg in sodium chloride 0.9 % 100 mL IVPB  Status:  Discontinued        250 mg 200 mL/hr over 30 Minutes Intravenous 4 times per day 06/15/12 1900  06/17/12 1426   06/15/12 2200   linezolid (ZYVOX) IVPB 600 mg  Status:  Discontinued        600 mg 300 mL/hr over 60 Minutes Intravenous Every 12 hours 06/15/12 1712 06/18/12 0909   06/15/12 1900   clindamycin (CLEOCIN) IVPB 600 mg  Status:  Discontinued        600 mg 100 mL/hr over 30 Minutes Intravenous 3 times per day 06/15/12 1126 06/16/12 0820   06/15/12 1800   clindamycin (CLEOCIN) IVPB 600 mg  Status:  Discontinued        600 mg 100 mL/hr over 30 Minutes Intravenous 4 times per day 06/15/12 1125 06/15/12 1126   06/15/12 1800   imipenem-cilastatin (PRIMAXIN) 500 mg in sodium chloride 0.9 % 100 mL IVPB  Status:  Discontinued        500 mg 200 mL/hr over 30 Minutes Intravenous 3 times per day 06/15/12 1728 06/15/12 1900   06/15/12 1230   vancomycin (VANCOCIN) powder 1,000 mg  Status:  Discontinued        1,000 mg Other To Surgery 06/15/12 1215 06/15/12 1712   06/15/12 1222   vancomycin (VANCOCIN) powder  Status:  Discontinued          As needed 06/15/12 1223 06/15/12 1323   06/15/12 1220   gentamicin (GARAMYCIN) injection  Status:  Discontinued          As needed 06/15/12 1222 06/15/12 1323   06/15/12 1130   clindamycin (CLEOCIN) IVPB 900 mg        900 mg 100 mL/hr over 30 Minutes Intravenous STAT 06/15/12 1123 06/15/12 1235   06/15/12 0600   vancomycin (VANCOCIN) IVPB 1000 mg/200 mL premix  Status:  Discontinued        1,000 mg 200 mL/hr over 60 Minutes Intravenous Every 12 hours 06/14/12 1745 06/15/12 1712   06/15/12 0200   piperacillin-tazobactam (ZOSYN) IVPB 3.375 g  Status:  Discontinued        3.375 g 12.5 mL/hr over 240 Minutes Intravenous Every 8 hours 06/14/12 1745 06/15/12 1657   06/14/12 1615   vancomycin (VANCOCIN) IVPB 1000 mg/200 mL premix        1,000 mg 200 mL/hr over 60 Minutes Intravenous  Once 06/14/12 1608 06/14/12 1924   06/14/12 1615  piperacillin-tazobactam (ZOSYN) IVPB 3.375 g       3.375 g 100 mL/hr over 30 Minutes Intravenous  Once 06/14/12  1608 06/14/12 1854   06/14/12 1600   vancomycin (VANCOCIN) IVPB 1000 mg/200 mL premix        1,000 mg 200 mL/hr over 60 Minutes Intravenous  Once 06/14/12 1547 06/14/12 1721   06/14/12 1545   vancomycin (VANCOCIN) injection 1,000 mg  Status:  Discontinued        1,000 mg Intravenous  Once 06/14/12 1532 06/14/12 1547          Medications:  Scheduled:   . allopurinol  200 mg Oral Daily  . calcium acetate  1,334 mg Oral TID WC  . collagenase   Topical Daily  . darbepoetin (ARANESP) injection - NON-DIALYSIS  150 mcg Subcutaneous Q14 Days  . feeding supplement (NEPRO CARB STEADY)  1,000 mL Per Tube QHS  . feeding supplement  30 mL Per Tube BID  . furosemide  160 mg Oral BID  . heparin subcutaneous  5,000 Units Subcutaneous Q8H  . hydrocortisone cream   Topical BID  . hydrocortisone sodium succinate  100 mg Intravenous Q6H  . insulin aspart  0-20 Units Subcutaneous Q4H  . insulin aspart  3 Units Subcutaneous TID WC  . insulin glargine  10 Units Subcutaneous QHS  . metoCLOPramide  5 mg Per Tube TID AC & HS  . polyethylene glycol  17 g Oral Once  . ranitidine  150 mg Per Tube QHS  . silver sulfADIAZINE   Topical Daily  . sodium bicarbonate  1,300 mg Oral BID  . vancomycin  1,500 mg Intravenous Q48H  . DISCONTD: polyethylene glycol  17 g Oral Daily    Objective: Vital signs in last 24 hours: Temp:  [97.9 F (36.6 C)-98.6 F (37 C)] 97.9 F (36.6 C) (07/13 1136) Pulse Rate:  [95-105] 95  (07/13 0746) Resp:  [15-18] 15  (07/13 0746) BP: (111-131)/(50-69) 131/60 mmHg (07/13 0746) SpO2:  [92 %-96 %] 92 % (07/13 0746) Weight:  [110 kg (242 lb 8.1 oz)] 110 kg (242 lb 8.1 oz) (07/13 0400)   General appearance: alert, cooperative and no distress Resp: clear to auscultation bilaterally Cardio: regular rate and rhythm GI: normal findings: bowel sounds normal and soft, non-tender Skin: diffuse vasculitic rash on UE. abd relatively spared (few lesions on lower abd).   Lab  Results  Basename 06/26/12 0410 06/25/12 0415  WBC 6.9 9.2  HGB 7.9* 8.7*  HCT 24.1* 26.2*  NA 138 135  K 4.3  4.3  CL 98 95*  CO2 27 23  BUN 80* 76*  CREATININE 5.42* 5.65*  GLU -- --   Liver Panel  Basename 06/26/12 0410 06/25/12 0415  PROT 5.0* --  ALBUMIN 1.6* 1.6*  AST 14 --  ALT <5 --  ALKPHOS 67 --  BILITOT 0.2* --  BILIDIR <0.1 --  IBILI NOT CALCULATED --   Sedimentation Rate No results found for this basename: ESRSEDRATE in the last 72 hours C-Reactive Protein No results found for this basename: CRP:2 in the last 72 hours  Microbiology: Recent Results (from the past 240 hour(s))  CULTURE, BLOOD (ROUTINE X 2)     Status: Normal   Collection Time   06/18/12  5:33 PM      Component Value Range Status Comment   Specimen Description BLOOD LEFT HAND   Final    Special Requests BOTTLES DRAWN AEROBIC ONLY [facility]   Final    Culture  Setup Time 06/19/2012 01:57   Final    Culture NO GROWTH 5 DAYS   Final    Report Status 06/25/2012 FINAL   Final   CULTURE, BLOOD (ROUTINE X 2)     Status: Normal   Collection Time   06/18/12  5:48 PM      Component Value Range Status Comment   Specimen Description BLOOD LEFT ARM   Final    Special Requests BOTTLES DRAWN AEROBIC ONLY 4[facility]   Final    Culture  Setup Time 06/19/2012 01:57   Final    Culture NO GROWTH 5 DAYS   Final    Report Status 06/25/2012 FINAL   Final   CULTURE, BLOOD (ROUTINE X 2)     Status: Normal   Collection Time   06/19/12  9:09 PM      Component Value Range Status Comment   Specimen Description BLOOD RIGHT HAND   Final    Special Requests BOTTLES DRAWN AEROBIC ONLY 5[facility]   Final    Culture  Setup Time 06/20/2012 03:01   Final    Culture NO GROWTH 5 DAYS   Final    Report Status 06/26/2012 FINAL   Final   CULTURE, BLOOD (ROUTINE X 2)     Status: Normal   Collection Time   06/19/12  9:09 PM      Component Value Range Status Comment   Specimen Description BLOOD RIGHT ARM   Final    Special Requests BOTTLES DRAWN  AEROBIC ONLY 5CC   Final    Culture  Setup Time 06/20/2012 03:01   Final    Culture NO GROWTH 5 DAYS   Final    Report Status 06/26/2012 FINAL   Final     Studies/Results: No results found.   Assessment/Plan: MSSA bacteremia 7-1     Repeat BCx 7-5 and 7-6 negative Leukocytoclastic Vasculitis ARF Day 13 anbx/Day 4 vanco Per pt and RN his rash is improving Continue vanco Could consider TEE? Renal function has stabilized for now, watch for improvement Pt, wife updated.  Available if questions 06-27-12  Johny Sax Infectious Diseases [phone] 06/26/2012, 12:25 PM   LOS: 12 days

## 2012-06-27 LAB — CBC
HCT: 26.3 % — ABNORMAL LOW (ref 39.0–52.0)
Hemoglobin: 8.6 g/dL — ABNORMAL LOW (ref 13.0–17.0)
MCHC: 32.7 g/dL (ref 30.0–36.0)
RBC: 3.16 MIL/uL — ABNORMAL LOW (ref 4.22–5.81)
WBC: 6.7 10*3/uL (ref 4.0–10.5)

## 2012-06-27 LAB — RENAL FUNCTION PANEL
Albumin: 1.8 g/dL — ABNORMAL LOW (ref 3.5–5.2)
Calcium: 8.5 mg/dL (ref 8.4–10.5)
Creatinine, Ser: 5.53 mg/dL — ABNORMAL HIGH (ref 0.50–1.35)
GFR calc non Af Amer: 10 mL/min — ABNORMAL LOW (ref >90)
Phosphorus: 5.9 mg/dL — ABNORMAL HIGH (ref 2.3–4.6)
Sodium: 135 mEq/L (ref 135–145)

## 2012-06-27 LAB — CARBOXYHEMOGLOBIN
Carboxyhemoglobin: 1.2 % (ref 0.5–1.5)
Methemoglobin: 0.9 % (ref 0.0–1.5)
Total hemoglobin: 17.1 g/dL (ref 13.5–18.0)

## 2012-06-27 LAB — GLUCOSE, CAPILLARY: Glucose-Capillary: 155 mg/dL — ABNORMAL HIGH (ref 70–99)

## 2012-06-27 MED ORDER — POTASSIUM CHLORIDE CRYS ER 20 MEQ PO TBCR
20.0000 meq | EXTENDED_RELEASE_TABLET | Freq: Once | ORAL | Status: AC
  Administered 2012-06-27: 20 meq via ORAL
  Filled 2012-06-27: qty 1

## 2012-06-27 MED ORDER — INSULIN GLARGINE 100 UNIT/ML ~~LOC~~ SOLN
20.0000 [IU] | Freq: Every day | SUBCUTANEOUS | Status: DC
Start: 2012-06-27 — End: 2012-06-29
  Administered 2012-06-27 – 2012-06-28 (×2): 20 [IU] via SUBCUTANEOUS

## 2012-06-27 NOTE — Progress Notes (Signed)
Subjective:  Sitting up in bed eating.  No nausea or SOB.Marland Kitchen No chest pain.  Objective:  Vital Signs in the last 24 hours: BP 123/61  Pulse 94  Temp 98.2 F (36.8 C) (Oral)  Resp 15  Ht 5\' 10"  (1.778 m)  Wt 111 kg (244 lb 11.4 oz)  BMI 35.11 kg/m2  SpO2 92%  Physical Exam: Pleasant WM in NAD Skin:  Diffuse maculopapular vasculitic rash Lungs:  Clear Cardiac:  RapidRegular rhythm, normal S1 and S2, no S3 Abdomen:  Soft, nontender, no masses Extremities:  2+edema, open wound is wrapped  Intake/Output from previous day: 07/13 0701 - 07/14 0700 In: 805 [P.O.:465; NG/GT:340] Out: 2050 [Urine:2050] Weight Filed Weights   06/26/12 0400 06/27/12 0300 06/27/12 0444  Weight: 110 kg (242 lb 8.1 oz) 111 kg (244 lb 11.4 oz) 111 kg (244 lb 11.4 oz)   Lab Results: Basic Metabolic Panel:  Basename 06/27/12 0500 06/26/12 0410  NA 135 138  K 3.3* 4.3  CL 92* 98  CO2 29 27  GLUCOSE 159* 214*  BUN 91* 80*  CREATININE 5.53* 5.42*   CBC:  Basename 06/27/12 0500 06/26/12 0410 06/25/12 0415  WBC 6.7 6.9 --  NEUTROABS -- -- 7.2  HGB 8.6* 7.9* --  HCT 26.3* 24.1* --  MCV 83.2 83.4 --  PLT 289 238 --   BNP    Component Value Date/Time   PROBNP 24481.0* 06/14/2012 1818   Telemetry:  Sinus rhythm.  Rate slower.  Assessment/Plan:  1. Acute systolic CHF with EF 15% ?septic cardiomyopathy volume status stable 2. Acute renal failure plateaued and nonoliguric weight still up 3. Leukocytoclastic vasculitis 4. Septic shock due to staph bacteremia 5. CAD  Rec:  Clinically is improved.  Creatinine slightly worse today.   Volume excess still and will leave diuretics to renal.  W. Ashley Royalty  MD [facility]  06/27/2012, 8:48 AM

## 2012-06-27 NOTE — Progress Notes (Signed)
Subjective: Interval History: none.  Objective: Vital signs in last 24 hours: Temp:  [97.9 F (36.6 C)-98.4 F (36.9 C)] 98.2 F (36.8 C) (07/14 0819) Pulse Rate:  [88-100] 88  (07/14 0820) Resp:  [15-20] 17  (07/14 0820) BP: (119-141)/(53-67) 141/67 mmHg (07/14 0820) SpO2:  [90 %-92 %] 91 % (07/14 0820) Weight:  [111 kg (244 lb 11.4 oz)] 111 kg (244 lb 11.4 oz) (07/14 0444) Weight change: 1 kg (2 lb 3.3 oz)  Intake/Output from previous day: 07/13 0701 - 07/14 0700 In: 805 [P.O.:465; NG/GT:340] Out: 2050 [Urine:2050] Intake/Output this shift: Total I/O In: 390 [P.O.:360; NG/GT:30] Out: 825 [Urine:825]  General appearance: alert and cooperative Resp: diminished breath sounds bilaterally Cardio: S1, S2 normal and systolic murmur: holosystolic 2/6, blowing at apex GI: obese pos bs, liver down 4 cm Extremities: edema 2+  Lab Results:  Basename 06/27/12 0500 06/26/12 0410  WBC 6.7 6.9  HGB 8.6* 7.9*  HCT 26.3* 24.1*  PLT 289 238   BMET:  Basename 06/27/12 0500 06/26/12 0410  NA 135 138  K 3.3* 4.3  CL 92* 98  CO2 29 27  GLUCOSE 159* 214*  BUN 91* 80*  CREATININE 5.53* 5.42*  CALCIUM 8.5 8.5   No results found for this basename: PTH:2 in the last 72 hours Iron Studies: No results found for this basename: IRON,TIBC,TRANSFERRIN,FERRITIN in the last 72 hours  Studies/Results: No results found.  I have reviewed the patient's current medications.  Assessment/Plan: 1 AKI nonoliguric, slow diuresis, Hco3 rising so D/C. K replaced.  Cr has plateaued. 2 CM stable 3 Fasciitis Vanc, wound care 4 DM controlled 5 Anemia epo/fe improving P AB,lasix,K, d/c bicarb    LOS: 13 days   Shiann Kam L 06/27/2012,10:44 AM

## 2012-06-27 NOTE — Progress Notes (Signed)
Name: Wayne [name] MRN: [id number] DOB: 1940/12/21    LOS: 13 Date of admit 06/14/2012 12:48 PM PCP is Kristian Covey, MD Ortho is Dr Jonathon Bellows  Referring Provider:  Dr Preston Fleeting of ER Reason for Referral:  Sepsis, hypotension, acidsis , hyperglycemia  PULMONARY / CRITICAL CARE MEDICINE  71 yr old septic shock, resp failure, leg source. S/p irrigation and debridement of LE on 7/2  Events Since Admission: 7/2- refractory shock post op, acidosis, arf, epi required 7/2 irrigation and debridement of LLE 7/2 2 units PRBC 7/3- Improved pressors 7/5- Off all pressors 7/6 2 units PRBC 7/8>>> SDU   Overnight: 7/13> No overnight issues 7/14> stable, seen by [place] Ouch, Deterding, Terri Piedra...   Current Status: No acute distress, eating better with reglan.  Pos BMs.  Pos gas from below.  Has TP Feed tube and TF going.  Wound better on leg.  BS higher.  Rash sl improved per pt & nursing.   Lines/Tubes: ETT 7/1>>7/5 NG Tube 7/1 >>7/5 Foley 7/1>>> RIJ 7/1>>>dc  R Radial 7/1>>>7/4 RPIV 7/1>>>7/4 LPIV 7/1>>> Premrose Drains7/2>>>7/5 L IJ HD catheter 7/4>>  ABX: Vanc 7/1 >>> 7/2 Zosyn 7/1 >>> 7/2 clinda7/2>>>7/3 Linezolid 7/2>>>7/5 Imipenim 7/2>>>7/5 Cefazolin ( MSSA cellulitis/abscess LLE per ID) 7/5 >>>HELD 7/10 d/t rash>>7/10 7/10 Vanco (MSSA, allergic to ceph, per ID)>>  Vital Signs: Temp:  [97.9 F (36.6 C)-98.4 F (36.9 C)] 98.4 F (36.9 C) (07/14 0300) Pulse Rate:  [94-100] 94  (07/14 0300) Resp:  [15-20] 15  (07/14 0300) BP: (119-136)/(53-65) 123/61 mmHg (07/14 0300) SpO2:  [90 %-92 %] 92 % (07/14 0300) Weight:  [111 kg (244 lb 11.4 oz)] 111 kg (244 lb 11.4 oz) (07/14 0444)  Physical Examination: General: NAD  C/o nausea.  Not eating  HEENT: NCAT, EOMi, HD cath in place PULM: CTA B CV: RRR, no mgr AB: BS hypoactive , soft, nontender   Ext: 2+ edema, dry dressings, tender LLE Neuro: A&Ox4 Skin: diffuse vesicular hemorrhagic/ erythematous  rash    Principal Problem:  *Staphylococcus aureus bacteremia with sepsis Active Problems:  Secondary DM with peripheral vascular disease, uncontrolled  HYPERTROPHY PROSTATE W/UR OBST & OTH LUTS  CORONARY ATHEROSCLEROSIS NATIVE CORONARY ARTERY  Polymyalgia rheumatica  Systolic CHF, chronic  OSA (obstructive sleep apnea)  Obesity  Cellulitis and abscess of lower leg  Healthcare-associated pneumonia  Hyperglycemia  Acute encephalopathy  NSTEMI, initial episode of care  AKI (acute kidney injury)  Protein-calorie malnutrition, severe  Leucocytoclastic vasculitis   ASSESSMENT AND PLAN  PULMONARY  Lab 06/27/12 0636 06/26/12 0350 06/25/12 0425 06/24/12 1200 06/24/12 0543 06/21/12 0510  PHART -- -- -- -- -- 7.467*  PCO2ART -- -- -- -- -- 31.9*  PO2ART -- -- -- -- -- 65.4*  HCO3 -- -- -- -- -- 22.9  O2SAT 57.3 85.5 79.5 87.1 55.1 --   CXR:  7/11:  No edema, lines ok ETT: 7/1>>>7/5  A:  Acute Resp Distress due to metabolic acidosis +/- LLL PNA,  r/o ards, not impressed overload. Resolved Doing well since extubation 7/5. No supplemental O2 requirement. Pt w/ h/o OSA.  P:   ~ on O2 ~ not requiring CPAP & staff notes resting well, no snoring, not restless, etc.    CARDIOVASCULAR No results found for this basename: TROPONINI:5,LATICACIDVEN:5, O2SATVEN:5,PROBNP:5 in the last 168 hours  Lab 06/27/12 0500 06/26/12 0410 06/25/12 0415  WBC 6.7 6.9 9.2  HGB 8.6* 7.9* 8.7*  HCT 26.3* 24.1* 26.2*  PLT 289 238 265   ECG:  none Lines:  RIJ 7/1>>>7/4 L IJ HD 7/4>> LPIV 7/1>>>  A: Severe sepsis/septic shock  d/t staph leg abscess and bacteremia (MSSA)-  has resolved.- Pt EF of 45% 2009. Echo  showing EF 15%, diffuse hypo. 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   P:  Per cardiology team and appreciate ~ on SQ heparin, PO Lasix   RENAL  Lab 06/27/12 0500 06/26/12 0410 06/25/12 0415 06/24/12 0515 06/23/12 0500  NA 135 138 135 139 136  K 3.3* 4.3 -- -- --  CL 92* 98 95*  99 99  CO2 29 27 23 19 21   BUN 91* 80* 76* 67* 62*  CREATININE 5.53* 5.42* 5.65* 5.36* 5.27*  CALCIUM 8.5 8.5 8.2* 8.5 8.2*  MG -- -- -- -- 2.2  PHOS 5.9* 6.9* 7.8* 8.8* 7.0*   Intake/Output      07/13 0701 - 07/14 0700 07/14 0701 - 07/15 0700   P.O. 465    Other     NG/GT 340    IV Piggyback     Total Intake(mL/kg) 805 (7.3)    Urine (mL/kg/hr) 2050 (0.8)    Stool 0    Total Output 2050    Net -1245         Stool Occurrence 1 x     Foley: 7/1>>>  A:  Sepsis induced ATN/ARF slowly better, now in post ATN diuresis phase, not filtering yet  P:   ~ Per renal, no HD yet... ~ on LASIX 160 Q12h   GASTROINTESTINAL  Lab 06/27/12 0500 06/26/12 0410 06/25/12 0415 06/24/12 0515 06/23/12 0500 06/22/12 0424  AST -- 14 -- -- -- 16  ALT -- <5 -- -- -- <5  ALKPHOS -- 67 -- -- -- 69  BILITOT -- 0.2* -- -- -- 0.2*  PROT -- 5.0* -- -- -- 4.7*  ALBUMIN 1.8* 1.6* 1.6* 1.6* 1.4* --   KUB 7/11: mild ileus d/t renal issues, cardiac output  A:  MOD  ileus. Abdominal bloating , not taking POs, severe protein calorie malnutrition, wound not healing, ?element gastroparesis with DM  P:   ~ Transpyloric FT in place,  Change TF to QHS and POs daytime ~ Cont IV Reglan, change to PO ~ H2 blocker ~ 7/13> 6-7 stools yest, on TF, will stop the Miralax for now...   HEMATOLOGIC  Lab 06/27/12 0500 06/26/12 0410 06/25/12 0415 06/24/12 0515 06/23/12 0500  HGB 8.6* 7.9* 8.7* 12.1* 9.5*  HCT 26.3* 24.1* 26.2* 36.7* 28.4*  PLT 289 238 265 367 308  INR -- -- -- -- --  APTT -- -- -- -- --   A:  Hgb trend +/- stable  P:  ~ no tfrx unless Hgb < 7.0 ~  7/13> Hg=7.9, 7/14> Hg=8.6, following...    INFECTIOUS  Lab 06/27/12 0500 06/26/12 0410 06/25/12 0415 06/24/12 0515 06/23/12 0500  WBC 6.7 6.9 9.2 11.9* 8.0  PROCALCITON -- -- -- -- --   Cultures: 7/1 BCX: >>> SA pansens 7/1 BCX:>>> SA 7/1 UCX >>>  No growth 7/2 Wound CX: >>> MSSA  ABX:  Per ID SVC Vanc 7/1 >>> 7/2 Zosyn 7/1 >>>  7/2 7/2 clinda>>> 7/3 Linezolid 7/2>>>7/5 Imipenim 7/2>>>7/5 Ancef 7/5>>>HELD for rash >>d/c 7/10 Vanco 7/10 (MSSA, ceph allergic)>>  A:  Bacteremia and Sepsis w/ SA that is pan sensitive. Cellulitis LLE w/ Abscess, s/p irriagation and debridement.  Rash now on arms/legs ??d/t ceph allergy, possibly viral exanthem?  P:   abx per ID seen by DrLupton, prob Leukocytoclastic  vasculitis, bx taken & path confirms, on Solucortef 100mg  Q6h   ENDOCRINE  Lab 06/27/12 0324 06/26/12 2331 06/26/12 1932 06/26/12 1618 06/26/12 1209  GLUCAP 155* 278* 284* 250* 300*   A:   Blood sugars elevated d/t TF, Hx of DM2  P:   Resume Lantus==> increase to 20u w/ Solucortef Rx Increase SSI to resistant scale   NEUROLOGIC  A:  Pain control only issue  P:   Prn fentanyl lidoderm patch   BEST PRACTICE / DISPOSITION Level of Care:  SDU Primary Service:  pccm Consultants:  Ortho Dr Lajoyce Corners , ID, [place] Cards  Code Status:  Full  Diet:  PO daytime, QHS TF DVT Px:  Hep SQ GI Px: PO  H2 blocker Skin Integrity:  Intact other than LLE Social / Family:     Krew Hortman M 06/27/2012, 7:45 AM

## 2012-06-28 DIAGNOSIS — M310 Hypersensitivity angiitis: Secondary | ICD-10-CM

## 2012-06-28 LAB — GLUCOSE, CAPILLARY
Glucose-Capillary: 257 mg/dL — ABNORMAL HIGH (ref 70–99)
Glucose-Capillary: 310 mg/dL — ABNORMAL HIGH (ref 70–99)
Glucose-Capillary: 164 mg/dL — ABNORMAL HIGH (ref 70–99)

## 2012-06-28 LAB — CARBOXYHEMOGLOBIN
Carboxyhemoglobin: 1.7 % — ABNORMAL HIGH (ref 0.5–1.5)
Methemoglobin: 1.1 % (ref 0.0–1.5)
O2 Saturation: 73.3 %
Total hemoglobin: 8.7 g/dL — ABNORMAL LOW (ref 13.5–18.0)

## 2012-06-28 LAB — CBC
MCHC: 32.8 g/dL (ref 30.0–36.0)
Platelets: 331 10*3/uL (ref 150–400)
RDW: 17.2 % — ABNORMAL HIGH (ref 11.5–15.5)
WBC: 8.8 10*3/uL (ref 4.0–10.5)

## 2012-06-28 LAB — COMPREHENSIVE METABOLIC PANEL
ALT: <5 U/L (ref 0–53)
Albumin: 1.8 g/dL — ABNORMAL LOW (ref 3.5–5.2)
Alkaline Phosphatase: 65 U/L (ref 39–117)
Calcium: 8.5 mg/dL (ref 8.4–10.5)
Potassium: 3.3 mEq/L — ABNORMAL LOW (ref 3.5–5.1)
Sodium: 136 mEq/L (ref 135–145)
Total Protein: 5.2 g/dL — ABNORMAL LOW (ref 6.0–8.3)

## 2012-06-28 MED ORDER — PREDNISONE 20 MG PO TABS
40.0000 mg | ORAL_TABLET | Freq: Every day | ORAL | Status: DC
  Administered 2012-06-29 – 2012-07-01 (×2): 40 mg via ORAL
  Filled 2012-06-28 (×4): qty 2

## 2012-06-28 MED ORDER — POTASSIUM CHLORIDE 20 MEQ/15ML (10%) PO LIQD
20.0000 meq | Freq: Every day | ORAL | Status: DC
  Administered 2012-06-28 – 2012-07-02 (×5): 20 meq via ORAL
  Filled 2012-06-28 (×5): qty 15

## 2012-06-28 MED ORDER — ENSURE COMPLETE PO LIQD
237.0000 mL | Freq: Three times a day (TID) | ORAL | Status: DC
  Administered 2012-06-28 – 2012-07-02 (×8): 237 mL via ORAL

## 2012-06-28 MED ORDER — DEXTROSE 50 % IV SOLN
INTRAVENOUS | Status: AC
  Filled 2012-06-28: qty 50

## 2012-06-28 MED ORDER — FENTANYL CITRATE 0.05 MG/ML IJ SOLN
100.0000 ug | Freq: Every day | INTRAMUSCULAR | Status: DC | PRN
  Administered 2012-06-29: 100 ug via INTRAVENOUS
  Filled 2012-06-28 (×2): qty 2

## 2012-06-28 MED ORDER — CARVEDILOL 3.125 MG PO TABS
3.1250 mg | ORAL_TABLET | Freq: Two times a day (BID) | ORAL | Status: DC
  Administered 2012-06-28 – 2012-07-02 (×8): 3.125 mg via ORAL
  Filled 2012-06-28 (×11): qty 1

## 2012-06-28 MED ORDER — POTASSIUM CHLORIDE 20 MEQ/15ML (10%) PO LIQD
20.0000 meq | Freq: Once | ORAL | Status: AC
  Administered 2012-06-28: 20 meq via ORAL
  Filled 2012-06-28: qty 15

## 2012-06-28 NOTE — Progress Notes (Signed)
Patient ID: Wayne [name], male   DOB: Jul 04, 1941, 71 y.o.   MRN: [id number]    [facility]    Date of Admission:  06/14/2012    Total days of antibiotics 15        Day 6 vancomycin         Principal Problem:  *Staphylococcus aureus bacteremia with sepsis Active Problems:  Cellulitis and abscess of lower leg  Secondary DM with peripheral vascular disease, uncontrolled  HYPERTROPHY PROSTATE W/UR OBST & OTH LUTS  CORONARY ATHEROSCLEROSIS NATIVE CORONARY ARTERY  Polymyalgia rheumatica  Systolic CHF, chronic  OSA (obstructive sleep apnea)  Obesity  Healthcare-associated pneumonia  Hyperglycemia  Acute encephalopathy  NSTEMI, initial episode of care  AKI (acute kidney injury)  Protein-calorie malnutrition, severe  Leucocytoclastic vasculitis      . allopurinol  200 mg Oral Daily  . calcium acetate  1,334 mg Oral TID WC  . carvedilol  3.125 mg Oral BID WC  . collagenase   Topical Daily  . darbepoetin (ARANESP) injection - NON-DIALYSIS  150 mcg Subcutaneous Q14 Days  . feeding supplement (NEPRO CARB STEADY)  1,000 mL Per Tube QHS  . feeding supplement  30 mL Per Tube BID  . heparin subcutaneous  5,000 Units Subcutaneous Q8H  . hydrocortisone cream   Topical BID  . insulin aspart  0-20 Units Subcutaneous Q4H  . insulin aspart  3 Units Subcutaneous TID WC  . insulin glargine  20 Units Subcutaneous QHS  . metoCLOPramide  5 mg Per Tube TID AC & HS  . polyethylene glycol  17 g Oral Once  . predniSONE  40 mg Oral Q breakfast  . ranitidine  150 mg Per Tube QHS  . silver sulfADIAZINE   Topical Daily  . vancomycin  1,500 mg Intravenous Q48H  . DISCONTD: hydrocortisone sodium succinate  100 mg Intravenous Q6H    Subjective: He is feeling better.  Objective: Temp:  [97.4 F (36.3 C)-98.9 F (37.2 C)] 98 F (36.7 C) (07/15 1207) Pulse Rate:  [85-91] 85  (07/15 0422) Resp:  [13-20] 13  (07/15 0422) BP: (125-134)/(60-69) 133/69 mmHg (07/15 0800) SpO2:   [92 %-96 %] 92 % (07/15 0800) Weight:  [109.5 kg (241 lb 6.5 oz)] 109.5 kg (241 lb 6.5 oz) (07/15 0442)  General: More alert and interactive Skin: I do not see any new skin lesions. His old skin lesions are evolving slowly and starting to dry up Left leg is wrapped  Assessment: He is improving on steroids for probable leukocytoclastic vasculitis. His left leg wound is going to require further debridement but his systemic MSSA infection is under control.  Plan: 1. Continue vancomycin 2. I will followup Wednesday, July 17  Cliffton Asters, MD [facility] [phone] pager   [phone] cell 06/28/2012, 1:24 PM

## 2012-06-28 NOTE — Progress Notes (Signed)
Patient ID: Wayne [name], male   DOB: 01/30/1941, 71 y.o.   MRN: [id number] Patient has had further dehiscence of the left posterior calf incision. Will plan for surgical intervention early this week for repeat debridement irrigation and wound closure and placement of an incisional wound VAC.

## 2012-06-28 NOTE — Progress Notes (Signed)
I agree with the following treatment note after reviewing documentation.   Johnston, Dosha Broshears Brynn   OTR/L Pager: [phone] Office: [phone] .   

## 2012-06-28 NOTE — Progress Notes (Signed)
Hydrotherapy Note:   06/28/12 1037  Subjective Assessment  Subjective "Let's go ahead and get this over with."  Patient and Family Stated Goals Healed up and able to walk  Evaluation and Treatment  Evaluation and Treatment Procedures Explained to Patient/Family Yes  Evaluation and Treatment Procedures agreed to  Incision 06/15/12 Leg Left  Date First Assessed/Time First Assessed: 06/15/12 1328   Location: Leg  Location Orientation: Left  Site / Wound Assessment Bleeding;Black;Red;Dusky  Margins Unattacted edges (unapproximated)  Closure Sutures (Sutures approximate in places and also dehiscing.)  Drainage Amount Moderate  Drainage Description Serosanguineous;Sanguineous;Odor  Dressing Type ABD;Moist to dry;Other (Comment) (Silvadene, Santyl, NS gauze (kerlix), ABD, dry kerlix, ACE.)  Dressing Changed  Wound Therapy - Assess/Plan/Recommendations  Wound Therapy - Clinical Statement Pt admitted with large left calf wound with slow progress healing.  Pt continues to tolerate and require hydrotherapy to remove necrotic tissue and clotting.  Wound dehiscence noted.  Also noted MD to return to OR for closure with VAC placement possibly this week.  Will follow.  Hydrotherapy Plan Debridement;Dressing change;Patient/family education;Pulsatile lavage with suction (PLS at 4-12 psi x 2000 mL NS.)  Wound Therapy - Frequency 6X / week  Wound Therapy - Follow Up Recommendations Skilled nursing facility  Wound Therapy Goals - Improve the function of patient's integumentary system by progressing the wound(s) through the phases of wound healing by:  Decrease Necrotic Tissue - Progress Progressing toward goal  Increase Granulation Tissue - Progress Progressing toward goal  Improve Drainage Characteristics - Progress Progressing toward goal    Pain: 7/10 in left LE with treatment.  Pt premedicated, RN aware, and pt repositioned.  06/28/2012 Cephus Shelling, PT, DPT [phone]

## 2012-06-28 NOTE — Progress Notes (Addendum)
Patient ID: Wayne [name], male   DOB: 17-Sep-1941, 71 y.o.   MRN: [id number] S:no new complaints O:BP 135/63  Pulse 80  Temp 98 F (36.7 C) (Oral)  Resp 16  Ht 5\' 10"  (1.778 m)  Wt 109.5 kg (241 lb 6.5 oz)  BMI 34.64 kg/m2  SpO2 93%  Intake/Output Summary (Last 24 hours) at 06/28/12 1402 Last data filed at 06/28/12 1300  Gross per 24 hour  Intake   1150 ml  Output   2651 ml  Net  -1501 ml   Intake/Output: I/O last 3 completed shifts: In: 2330 [P.O.:1200; NG/GT:630; IV Piggyback:500] Out: 4375 [Urine:4375]  Intake/Output this shift:  Total I/O In: 350 [P.O.:350] Out: 601 [Urine:600; Stool:1] Weight change: -1.5 kg (-3 lb 4.9 oz) Gen:WD WN obese WM in NAD CVS:RRR Resp:CTA WUJ:[id number] Ext:+edema   Lab 06/28/12 0410 06/27/12 0500 06/26/12 0410 06/25/12 0415 06/24/12 0515 06/23/12 0500 06/22/12 0424  NA 136 135 138 135 139 136 137  K 3.3* 3.3* 4.3 4.3 4.8 3.9 4.1  CL 93* 92* 98 95* 99 99 101  CO2 29 29 27 23 19 21 22   GLUCOSE 143* 159* 214* 336* 129* 93 129*  BUN 98* 91* 80* 76* 67* 62* 55*  CREATININE 5.11* 5.53* 5.42* 5.65* 5.36* 5.27* 4.74*  ALBUMIN 1.8* 1.8* 1.6* 1.6* 1.6* 1.4* 1.5*  CALCIUM 8.5 8.5 8.5 8.2* 8.5 8.2* 8.1*  PHOS 5.9* 5.9* 6.9* 7.8* 8.8* 7.0* 5.9*  AST 27 -- 14 -- -- -- 16  ALT <5 -- <5 -- -- -- <5   Liver Function Tests:  Lab 06/28/12 0410 06/27/12 0500 06/26/12 0410 06/22/12 0424  AST 27 -- 14 16  ALT <5 -- <5 <5  ALKPHOS 65 -- 67 69  BILITOT 0.2* -- 0.2* 0.2*  PROT 5.2* -- 5.0* 4.7*  ALBUMIN 1.8* 1.8* 1.6* --   No results found for this basename: LIPASE:3,AMYLASE:3 in the last 168 hours No results found for this basename: AMMONIA:3 in the last 168 hours CBC:  Lab 06/28/12 0410 06/27/12 0500 06/26/12 0410 06/25/12 0415 06/24/12 0515 06/22/12 0424  WBC 8.8 6.7 6.9 -- -- --  NEUTROABS -- -- -- 7.2 -- 8.3*  HGB 8.6* 8.6* 7.9* -- -- --  HCT 26.2* 26.3* 24.1* -- -- --  MCV 83.4 83.2 83.4 83.7 83.4 --  PLT 331 289 238 -- -- --   Cardiac  Enzymes: No results found for this basename: CKTOTAL:5,CKMB:5,CKMBINDEX:5,TROPONINI:5 in the last 168 hours CBG:  Lab 06/28/12 1302 06/28/12 0743 06/28/12 0349 06/27/12 2330 06/27/12 1943  GLUCAP 164* 140* 155* 257* 310*    Iron Studies: No results found for this basename: IRON,TIBC,TRANSFERRIN,FERRITIN in the last 72 hours Studies/Results: No results found.    Marland Kitchen allopurinol  200 mg Oral Daily  . calcium acetate  1,334 mg Oral TID WC  . carvedilol  3.125 mg Oral BID WC  . collagenase   Topical Daily  . darbepoetin (ARANESP) injection - NON-DIALYSIS  150 mcg Subcutaneous Q14 Days  . feeding supplement (NEPRO CARB STEADY)  1,000 mL Per Tube QHS  . feeding supplement  30 mL Per Tube BID  . heparin subcutaneous  5,000 Units Subcutaneous Q8H  . hydrocortisone cream   Topical BID  . insulin aspart  0-20 Units Subcutaneous Q4H  . insulin aspart  3 Units Subcutaneous TID WC  . insulin glargine  20 Units Subcutaneous QHS  . metoCLOPramide  5 mg Per Tube TID AC & HS  . polyethylene glycol  17 g  Oral Once  . predniSONE  40 mg Oral Q breakfast  . ranitidine  150 mg Per Tube QHS  . silver sulfADIAZINE   Topical Daily  . vancomycin  1,500 mg Intravenous Q48H  . DISCONTD: hydrocortisone sodium succinate  100 mg Intravenous Q6H    BMET    Component Value Date/Time   NA 136 06/28/2012 0410   K 3.3* 06/28/2012 0410   CL 93* 06/28/2012 0410   CO2 29 06/28/2012 0410   GLUCOSE 143* 06/28/2012 0410   BUN 98* 06/28/2012 0410   CREATININE 5.11* 06/28/2012 0410   CREATININE 0.78 06/30/2011 1702   CALCIUM 8.5 06/28/2012 0410   GFRNONAA 10* 06/28/2012 0410   GFRAA 12* 06/28/2012 0410   CBC    Component Value Date/Time   WBC 8.8 06/28/2012 0410   RBC 3.14* 06/28/2012 0410   HGB 8.6* 06/28/2012 0410   HCT 26.2* 06/28/2012 0410   PLT 331 06/28/2012 0410   MCV 83.4 06/28/2012 0410   MCH 27.4 06/28/2012 0410   MCHC 32.8 06/28/2012 0410   RDW 17.2* 06/28/2012 0410   LYMPHSABS 0.9 06/25/2012 0415   MONOABS  0.6 06/25/2012 0415   EOSABS 0.1 06/25/2012 0415   BASOSABS 0.0 06/25/2012 0415     Assessment/Plan:  1. AKI-nonoliguric.  Finally starting to see creatinine levels decline.  Increased BUN likely related to diuresis 2. MSSA bacteremia/sepsis- improving with Abx.  Appreciate ID input 3. Leukocytoclastic vasculitis/rash- per pt improving 4. Anemia- chronic illness/hospitalization 5. DM- per primary svc 6. Malnutrition- on TF's 7. Deconditioning- cont with PT/OT 8. Hypokalemia- replete orally  Wayne [name] A

## 2012-06-28 NOTE — Progress Notes (Signed)
Physical Therapy Treatment Patient Details Name: Wayne [name] MRN: [id number] DOB: Sep 29, 1941 Today's Date: 06/28/2012 Time: 4098-1191 PT Time Calculation (min): 17 min  PT Assessment / Plan / Recommendation Comments on Treatment Session  Pt was pleasant and motivated to transition to chair.  Pt expressed nervousness about moving L LE.  Pt experienced pain upon initially removing pillows from under L LE, but was then able to sit EOB and transfer.  Pt  reported that he was comfortable once in chair and glad to be OOB.  Pt will benefit from continued PT to increase mobility.  Continue with POC.      Follow Up Recommendations  Inpatient Rehab    Barriers to Discharge        Equipment Recommendations  Defer to next venue    Recommendations for Other Services Rehab consult  Frequency Min 3X/week   Plan Discharge plan remains appropriate;Frequency remains appropriate    Precautions / Restrictions Precautions Precautions: Fall   Pertinent Vitals/Pain     Mobility  Bed Mobility Supine to Sit: 1: +2 Total assist;HOB elevated;With rails Supine to Sit: Patient Percentage: 60% Sitting - Scoot to Edge of Bed: 1: +2 Total assist Sitting - Scoot to Edge of Bed: Patient Percentage: 60% Details for Bed Mobility Assistance: Pt required +2 assist to move and lower L LE and to obtain upright posture.  Used pad pull pt around to position hips properly Transfers Sit to Stand: 1: +2 Total assist;From bed;From elevated surface;With upper extremity assist;With armrests Sit to Stand: Patient Percentage: 60% Stand to Sit: 1: +2 Total assist;With upper extremity assist;To chair/3-in-1;With armrests Stand to Sit: Patient Percentage: 60% Stand Pivot Transfers: 1: +2 Total assist Stand Pivot Transfers: Patient Percentage: 60% Details for Transfer Assistance: Pt unable to bear weight on L LE.  Assist +2 for safety and support during transfer.  Pt requird VC for proper positioning to safely transfer.      Exercises     PT Diagnosis:    PT Problem List:   PT Treatment Interventions:     PT Goals Acute Rehab PT Goals PT Goal: Supine/Side to Sit - Progress: Progressing toward goal PT Goal: Sit to Stand - Progress: Progressing toward goal PT Transfer Goal: Bed to Chair/Chair to Bed - Progress: Progressing toward goal  Visit Information  Last PT Received On: 06/28/12 Assistance Needed: +2 PT/OT Co-Evaluation/Treatment: Yes    Subjective Data      Cognition  Overall Cognitive Status: Appears within functional limits for tasks assessed/performed Arousal/Alertness: Awake/alert Orientation Level: Appears intact for tasks assessed Behavior During Session: [facility] for tasks performed    Balance  Balance Balance Assessed: Yes Static Sitting Balance Static Sitting - Balance Support: Bilateral upper extremity supported;Feet supported (only support on R LE) Static Sitting - Level of Assistance: 3: Mod assist Static Sitting - Comment/# of Minutes: Pt sat EOB for ~ 5 minutes before pivot transfer to chair  End of Session PT - End of Session Equipment Utilized During Treatment: Gait belt Activity Tolerance: Patient limited by pain Patient left: in chair;with call bell/phone within reach Nurse Communication: Mobility status   GP     Naijah Lacek, SPTA 06/28/2012, 3:04 PM

## 2012-06-28 NOTE — Progress Notes (Signed)
Nutrition Follow-up / Consult  Intervention:    Calorie count x 48 hours (per MD)--RD to follow-up Tuesday with day 1 results.   Ensure Complete PO TID to maximize oral intake.  Will likely need to continue TF for a longer amount of time and at a higher rate to meet nutrition needs to support wound healing.  Current TF is only providing 30% of minimum estimated calorie needs and 42% of minimum estimated protein needs.    Assessment:   Patient with further dehiscence of incision on left calf.  Plans for surgical intervention this week with repeat debridement irrigation and wound closure with VAC placement.  Patient with increased nutrition needs to support wound healing.  Calorie count has been ordered.  PO intake is still minimal per RN and Product manager.  TF was changed to a nocturnal regimen on 7/12 and is only meeting 30% of minimum estimated calorie needs and 42% of minimum estimated protein needs.   Diet Order:  Renal 89/90-2-2 with 1200 ml fluid restriction; PO intake not being documented.  Previously on a heart healthy diet.   TF via NG tube:  Nepro at 40 ml/h from 10 PM to 5 AM (7 hours) with Pro-stat 30 ml BID providing 704 kcals, 53 grams protein, 204 ml free water daily.  Meds: Scheduled Meds:   . allopurinol  200 mg Oral Daily  . calcium acetate  1,334 mg Oral TID WC  . carvedilol  3.125 mg Oral BID WC  . collagenase   Topical Daily  . darbepoetin (ARANESP) injection - NON-DIALYSIS  150 mcg Subcutaneous Q14 Days  . feeding supplement (NEPRO CARB STEADY)  1,000 mL Per Tube QHS  . feeding supplement  30 mL Per Tube BID  . heparin subcutaneous  5,000 Units Subcutaneous Q8H  . hydrocortisone cream   Topical BID  . insulin aspart  0-20 Units Subcutaneous Q4H  . insulin aspart  3 Units Subcutaneous TID WC  . insulin glargine  20 Units Subcutaneous QHS  . metoCLOPramide  5 mg Per Tube TID AC & HS  . polyethylene glycol  17 g Oral Once  . potassium chloride   20 mEq Oral Daily  . potassium chloride  20 mEq Oral Once  . predniSONE  40 mg Oral Q breakfast  . ranitidine  150 mg Per Tube QHS  . silver sulfADIAZINE   Topical Daily  . vancomycin  1,500 mg Intravenous Q48H  . DISCONTD: hydrocortisone sodium succinate  100 mg Intravenous Q6H   Continuous Infusions:  PRN Meds:.sodium chloride, acetaminophen, fentaNYL, fentaNYL, lidocaine, magnesium sulfate 1 - 4 g bolus IVPB, ondansetron, oxyCODONE, silver nitrate applicators  Labs:  CMP     Component Value Date/Time   NA 136 06/28/2012 0410   K 3.3* 06/28/2012 0410   CL 93* 06/28/2012 0410   CO2 29 06/28/2012 0410   GLUCOSE 143* 06/28/2012 0410   BUN 98* 06/28/2012 0410   CREATININE 5.11* 06/28/2012 0410   CREATININE 0.78 06/30/2011 1702   CALCIUM 8.5 06/28/2012 0410   PROT 5.2* 06/28/2012 0410   ALBUMIN 1.8* 06/28/2012 0410   AST 27 06/28/2012 0410   ALT <5 06/28/2012 0410   ALKPHOS 65 06/28/2012 0410   BILITOT 0.2* 06/28/2012 0410   GFRNONAA 10* 06/28/2012 0410   GFRAA 12* 06/28/2012 0410     Intake/Output Summary (Last 24 hours) at 06/28/12 1506 Last data filed at 06/28/12 1300  Gross per 24 hour  Intake   1150 ml  Output  2651 ml  Net  -1501 ml    Weight Status:  109.5 kg stable  Re-estimated needs:  2300-2500 kcals, 130-150 grams protein daily  Nutrition Dx:  Inadequate oral intake, ongoing.  Goal:  Intake to meet >90% of estimated nutrition needs to support healing, unmet.  Monitor:  PO intake, TF tolerance/adequacy, wound healing, labs, weight trend.    Joaquin Courts, RD, CNSC, LDN Pager# [phone] After Hours Pager# [phone]

## 2012-06-28 NOTE — Progress Notes (Signed)
Occupational Therapy Treatment Patient Details Name: Wayne [name] MRN: [id number] DOB: 1941-06-08 Today's Date: 06/28/2012 Time: 4098-1191 OT Time Calculation (min): 17 min  OT Assessment / Plan / Recommendation Comments on Treatment Session Pt. limited by pain in LLE. Pt. was willing to participate and was in great spirits today.     Follow Up Recommendations  Inpatient Rehab    Barriers to Discharge       Equipment Recommendations  Defer to next venue    Recommendations for Other Services Rehab consult  Frequency Min 2X/week   Plan Discharge plan remains appropriate    Precautions / Restrictions Precautions Precautions: Fall Restrictions Weight Bearing Restrictions: No   Pertinent Vitals/Pain Vitals Monitored and Stable. Pain 8/10 in LLE.     ADL  Grooming: Performed;Wash/dry face;Set up Where Assessed - Grooming: Supported sitting Toilet Transfer: Simulated;+2 Total assistance Toilet Transfer: Patient Percentage: 60% Statistician Method: Surveyor, minerals: Comfort height toilet Equipment Used: Gait belt ADL Comments: Pt. performed stand pivot transfer from bed to chair. He washed his face with Setup A.       OT Goals Acute Rehab OT Goals OT Goal Formulation: With patient Time For Goal Achievement: 07/06/12 Potential to Achieve Goals: Good ADL Goals Pt Will Perform Grooming: with supervision;Standing at sink ADL Goal: Grooming - Progress: Progressing toward goals Pt Will Perform Lower Body Bathing: with min assist;Sit to stand from chair;Sit to stand from bed Pt Will Perform Lower Body Dressing: Sit to stand from chair;Sit to stand from bed;with min assist;with adaptive equipment Pt Will Transfer to Toilet: with supervision;Ambulation;with DME;3-in-1 ADL Goal: Toilet Transfer - Progress: Not progressing Pt Will Perform Toileting - Clothing Manipulation: with supervision;Standing Pt Will Perform Toileting - Hygiene: with supervision;with  set-up;Sit to stand from 3-in-1/toilet;Sitting on 3-in-1 or toilet Additional ADL Goal #1: Pt will be I with bil UE strengthening HEP in prep for UB use with ADLs  Visit Information  Last OT Received On: 06/28/12 Assistance Needed: +2    Subjective Data    While moving the pillows from under pt. Lt. Leg, he stated, "That pain pill didn't touch the pain in my leg just now."   Prior Functioning       Cognition  Overall Cognitive Status: Appears within functional limits for tasks assessed/performed Arousal/Alertness: Awake/alert Orientation Level: Appears intact for tasks assessed Behavior During Session: [facility] for tasks performed    Mobility Bed Mobility Supine to Sit: 1: +2 Total assist;HOB elevated;With rails Supine to Sit: Patient Percentage: 60% Sitting - Scoot to Edge of Bed: 1: +2 Total assist Sitting - Scoot to Edge of Bed: Patient Percentage: 60% Details for Bed Mobility Assistance: Pt required +2 assist to move and lower L LE and to obtain upright posture.  Used pad pull pt around to position hips properly Transfers Transfers: Sit to Stand;Stand to Sit Sit to Stand: 1: +2 Total assist;From bed;From elevated surface;With upper extremity assist;With armrests Sit to Stand: Patient Percentage: 60% Stand to Sit: 1: +2 Total assist;With upper extremity assist;To chair/3-in-1;With armrests Stand to Sit: Patient Percentage: 60% Details for Transfer Assistance: Pt unable to bear weight on L LE.  Assist +2 for safety and support during transfer.  Pt requird VC for proper positioning to safely transfer.          End of Session OT - End of Session Activity Tolerance: Patient tolerated treatment well;Patient limited by pain Patient left: in chair;with call bell/phone within reach  GO     Elli Groesbeck,  Adisynn Suleiman 06/28/2012, 3:07 PM

## 2012-06-28 NOTE — Progress Notes (Signed)
Utilization review completed.  

## 2012-06-28 NOTE — Progress Notes (Signed)
Patient ID: Wayne [name], male   DOB: 1941-02-12, 71 y.o.   MRN: [id number] Patient awake and comfortable, states he is feeling better.  Denies new skin lesions or discomfort with current lesions, no longer burns.  PE:  There are fewer vesicles, and the background purpuric macules are not as violaceous, many have resolved.  Non tender  A/P:  Leukocytoclastic vasculitis, improving with IV steroids and apparently tolerating treatment.  When a cause can be identified, infection or drugs are often implicated.  Given his medical issues and polypharmacy, he certainly fits that profile.  It is not possible to say which, if any of his medications may have caused the vasculitis, or if it is related to Staph.  No further workup is necessary as long as he continues to improve.  Continue steroids until the vesicles dessicate a little more, then taper off over 7-14 days.  I will sign off for now, but will be happy to see him again if necessary.

## 2012-06-28 NOTE — Progress Notes (Signed)
Thank you for consult on Wayne [name]. Chart reviewed and note that patient was admitted due to staph sepsis due to cellulitis LLE. Hydrotherapy ongoing with plans for surgery later this week.  Please re-consult once patient is mobilized.

## 2012-06-28 NOTE — Progress Notes (Signed)
Agree with treatment session.  06/28/2012 Kennett Symes M Aurea Aronov, PT, DPT [phone]   

## 2012-06-28 NOTE — Progress Notes (Signed)
Name: Wayne [name] MRN: [id number] DOB: 12-17-1940    LOS: 14 Date of admit 06/14/2012 12:48 PM PCP is Kristian Covey, MD Ortho is Dr Jonathon Bellows  Referring Provider:  Dr Preston Fleeting - ER Reason for Referral:  Sepsis, hypotension, acidosis , hyperglycemia    PULMONARY / CRITICAL CARE MEDICINE  71 yr old septic shock, resp failure, leg source. S/p irrigation and debridement of LE on 7/2.  Events Since Admission: 7/2- refractory shock post op, acidosis, arf, epi required 7/2 irrigation and debridement of LLE 7/2 2 units PRBC 7/3- Improved pressors 7/5- Off all pressors 7/6 2 units PRBC 7/8>>> SDU 7/13> No overnight issues 7/14> stable, seen by [place] Ouch, Deterding, Terri Piedra...  Overnight: Pt reports feeling like Panda is causing mechanical issues with swallowing but is hesitant to remove out of concern for need of reinsertion.  Tolerating PO's during day.  C/O's significant pain during dressing change.    Current Status: No acute distress, eating better with reglan.  Pos BMs.    Lines/Tubes: ETT 7/1>>7/5 NG Tube 7/1 >>7/5 Foley 7/1>>> RIJ 7/1>>>dc  R Radial 7/1>>>7/4 RPIV 7/1>>>7/4 LPIV 7/1>>> Premrose Drains7/2>>>7/5 L IJ HD catheter 7/4>>  ABX: Vanc 7/1 >>> 7/2 Zosyn 7/1 >>> 7/2 clinda7/2>>>7/3 Linezolid 7/2>>>7/5 Imipenim 7/2>>>7/5 Cefazolin ( MSSA cellulitis/abscess LLE per ID) 7/5 >>>HELD 7/10 d/t rash>>7/10 7/10 Vanco (MSSA, allergic to ceph, per ID)>>  Vital Signs: Temp:  [97.4 F (36.3 C)-98.9 F (37.2 C)] 97.4 F (36.3 C) (07/15 0800) Pulse Rate:  [85-91] 85  (07/15 0422) Resp:  [13-20] 13  (07/15 0422) BP: (124-134)/(56-69) 133/69 mmHg (07/15 0800) SpO2:  [92 %-96 %] 92 % (07/15 0800) Weight:  [241 lb 6.5 oz (109.5 kg)] 241 lb 6.5 oz (109.5 kg) (07/15 0442)  Physical Examination: General: NAD, improved color, mentation. HEENT: NCAT, EOMi, HD cath in place PULM: CTA B CV: RRR, no mgr AB: BS hypoactive , soft, nontender   Ext:  dry dressings,  tender LLE Neuro: A&Ox4 Skin: diffuse vesicular hemorrhagic/ erythematous rash    Principal Problem:  *Staphylococcus aureus bacteremia with sepsis Active Problems:  Secondary DM with peripheral vascular disease, uncontrolled  HYPERTROPHY PROSTATE W/UR OBST & OTH LUTS  CORONARY ATHEROSCLEROSIS NATIVE CORONARY ARTERY  Polymyalgia rheumatica  Systolic CHF, chronic  OSA (obstructive sleep apnea)  Obesity  Cellulitis and abscess of lower leg  Healthcare-associated pneumonia  Hyperglycemia  Acute encephalopathy  NSTEMI, initial episode of care  AKI (acute kidney injury)  Protein-calorie malnutrition, severe  Leucocytoclastic vasculitis   ASSESSMENT AND PLAN  PULMONARY  Lab 06/28/12 0424 06/27/12 0636 06/26/12 0350 06/25/12 0425 06/24/12 1200  PHART -- -- -- -- --  PCO2ART -- -- -- -- --  PO2ART -- -- -- -- --  HCO3 -- -- -- -- --  O2SAT 73.3 57.3 85.5 79.5 87.1   CXR:  7/11:  No edema, lines ok ETT: 7/1>>>7/5  A:   Acute Resp Distress due to metabolic acidosis +/- LLL PNA,  ARDS.  Resolved Doing well since extubation 7/5. No supplemental O2 requirement. Pt w/ h/o OSA.   P:   -not requiring CPAP & staff notes resting well, no snoring, not restless, etc.    CARDIOVASCULAR No results found for this basename: TROPONINI:5,LATICACIDVEN:5, O2SATVEN:5,PROBNP:5 in the last 168 hours  Lab 06/28/12 0410 06/27/12 0500 06/26/12 0410  WBC 8.8 6.7 6.9  HGB 8.6* 8.6* 7.9*  HCT 26.2* 26.3* 24.1*  PLT 331 289 238   ECG:  none Lines:  RIJ 7/1>>>7/4 L IJ HD  7/4>> LPIV 7/1>>>  A:  Severe sepsis/septic shock d/t staph leg abscess and bacteremia (MSSA)-  has resolved.-  Pt EF of 45% 2009. Echo  showing EF 15%, diffuse hypo. 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   P:  -Per cardiology team and appreciate input -SQ heparin -note plans to restart coreg and ASA when OK from surgical standpoint    RENAL  Lab 06/28/12 0410 06/27/12 0500 06/26/12 0410 06/25/12 0415  06/24/12 0515 06/23/12 0500  NA 136 135 138 135 139 --  K 3.3* 3.3* -- -- -- --  CL 93* 92* 98 95* 99 --  CO2 29 29 27 23 19  --  BUN 98* 91* 80* 76* 67* --  CREATININE 5.11* 5.53* 5.42* 5.65* 5.36* --  CALCIUM 8.5 8.5 8.5 8.2* 8.5 --  MG -- -- -- -- -- 2.2  PHOS 5.9* 5.9* 6.9* 7.8* 8.8* --   Intake/Output      07/14 0701 - 07/15 0700 07/15 0701 - 07/16 0700   P.O. 1200 200   NG/GT 350    IV Piggyback 500    Total Intake(mL/kg) 2050 (18.7) 200 (1.8)   Urine (mL/kg/hr) 3525 (1.3) 300   Total Output 3525 300   Net -1475 -100        Stool Occurrence 2 x     Foley: 7/1>>>  A:   Sepsis induced ATN/ARF slowly better, now in post ATN diuresis phase, not filtering yet  P:   -Per renal, no HD yet -Lasix held 7/14 per Nephrology   GASTROINTESTINAL  Lab 06/28/12 0410 06/27/12 0500 06/26/12 0410 06/25/12 0415 06/24/12 0515 06/22/12 0424  AST 27 -- 14 -- -- 16  ALT <5 -- <5 -- -- <5  ALKPHOS 65 -- 67 -- -- 69  BILITOT 0.2* -- 0.2* -- -- 0.2*  PROT 5.2* -- 5.0* -- -- 4.7*  ALBUMIN 1.8* 1.8* 1.6* 1.6* 1.6* --   KUB 7/11: mild ileus d/t renal issues, cardiac output  A:   MOD  ileus. Abdominal bloating , not taking POs, severe protein calorie malnutrition, wound not healing, ?element gastroparesis with DM  P:   -Transpyloric FT in place -has had issues with adequate PO intake.  7/15 plan for calorie count during day and hold TF at night.  Patient has little appetite during day.  -Cont IV Reglan, change to PO -H2 blocker -7/13> 6-7 stools yest, on TF-->hold miralax   HEMATOLOGIC  Lab 06/28/12 0410 06/27/12 0500 06/26/12 0410 06/25/12 0415 06/24/12 0515  HGB 8.6* 8.6* 7.9* 8.7* 12.1*  HCT 26.2* 26.3* 24.1* 26.2* 36.7*  PLT 331 289 238 265 367  INR -- -- -- -- --  APTT -- -- -- -- --   A:   Hgb trend +/- stable  P:  -no tfrx unless Hgb < 7.0 -7/13> Hg=7.9, 7/14> Hg=8.6, following    INFECTIOUS  Lab 06/28/12 0410 06/27/12 0500 06/26/12 0410 06/25/12 0415 06/24/12  0515  WBC 8.8 6.7 6.9 9.2 11.9*  PROCALCITON -- -- -- -- --   Cultures: 7/1 BCX: >>> SA pansens 7/1 BCX:>>> SA 7/1 UCX >>>  No growth 7/2 Wound CX: >>> MSSA  ABX:  Per ID SVC Vanc 7/1 >>> 7/2 Zosyn 7/1 >>> 7/2 7/2 clinda>>> 7/3 Linezolid 7/2>>>7/5 Imipenim 7/2>>>7/5 Ancef 7/5>>>HELD for rash >>d/c 7/10 Vanco 7/10 (MSSA, ceph allergic)>>  A:   Bacteremia and Sepsis w/ SA that is pan sensitive.  Cellulitis LLE w/ Abscess, s/p irriagation and debridement.   Rash now on arms/legs ??  d/t ceph allergy, possibly viral exanthem?  P:   -abx per ID -seen by Dr Terri Piedra, Leukocytoclastic vasculitis, bx taken & path confirms, on Solucortef 100mg  Q6h >> will change to pred 40   ENDOCRINE  Lab 06/28/12 0743 06/28/12 0349 06/27/12 2330 06/27/12 1943 06/27/12 1610  GLUCAP 140* 155* 257* 310* 202*   A:    Blood sugars elevated d/t TF, Hx of DM2  P:   -Resume Lantus==> increase to 20u w/ Solucortef Rx -Increase SSI to resistant scale   NEUROLOGIC  A:   Pain   P:   -Prn fentanyl, add one time PRN additional dose fentanyl for dressing changes -lidoderm patch   BEST PRACTICE / DISPOSITION Level of Care:  SDU Primary Service:  pccm Consultants:  Ortho Dr Lajoyce Corners , ID, [place] Cards  Code Status:  Full  Diet:  PO daytime, QHS TF DVT Px:  Hep SQ GI Px: PO  H2 blocker Skin Integrity:  Intact other than LLE Social / Family:     Canary Brim, NP-C [facility] Pgr: [phone] or [phone]   Attending Addendum:  I have seen the patient, discussed the issues, test results and plans with B. Veleta Miners, NP. I agree with the Assessment and Plans as ammended above.   Levy Pupa, MD, PhD 06/28/2012, 11:44 AM [facility] [phone] or if no answer [phone]

## 2012-06-28 NOTE — Progress Notes (Signed)
Patient ID: Wayne [name], male   DOB: 03/19/1941, 71 y.o.   MRN: [id number] Advanced Heart Failure Rounding Note   Subjective:    Aspirin and Effient stopped due to hematoma in early May 2013. Admit with LLE calf absess. MSSA bacteremia. Reduced EF noted 15% (from 45%) this admit which was thought to be septic cardiomyopathy. HF medications stopped  May 13 by the patient for unknown reasons. He had been on carvedilol, lisinopril, lasix, spironolactone, and digoxin in the past.   Levophed and CVVH stopped 7/6. Weight up 15 pounds from admission. I/O -1.7 liters, weight down 11 pounds from yesterday  CO-OX 59.2>55.1  Fick Cardiac Output  4.16 Cardiac Index 1.79 Creatinine 1.8 >2.3>2.73>3.9 >4.7>5.2>5.36>5.11  Denies SOB/PND/Orthopnea. UOP improving.  BP/HR stable.   Objective:   Weight Range:  Vital Signs:   Temp:  [97.4 F (36.3 C)-98.9 F (37.2 C)] 97.4 F (36.3 C) (07/15 0700) Pulse Rate:  [85-91] 85  (07/15 0422) Resp:  [13-20] 13  (07/15 0422) BP: (124-141)/(56-69) 134/64 mmHg (07/15 0422) SpO2:  [91 %-96 %] 95 % (07/15 0422) Weight:  [241 lb 6.5 oz (109.5 kg)] 241 lb 6.5 oz (109.5 kg) (07/15 0442) Last BM Date: 06/27/12  Weight change: Filed Weights   06/27/12 0444 06/28/12 0422 06/28/12 0442  Weight: 244 lb 11.4 oz (111 kg) 241 lb 6.5 oz (109.5 kg) 241 lb 6.5 oz (109.5 kg)    Intake/Output:   Intake/Output Summary (Last 24 hours) at 06/28/12 0806 Last data filed at 06/28/12 0423  Gross per 24 hour  Intake   2050 ml  Output   3525 ml  Net  -1475 ml     Physical Exam:  General:  Chronically ill appearing. No resp difficulty HEENT: normal Neck: supple. No JVD.  Carotids 2+ bilat; no bruits. No lymphadenopathy or thryomegaly appreciated. Cor: PMI nondisplaced. Regular rate & rhythm. No rubs, or murmurs. S3 Lungs: clear Abdomen: soft, nontender, distended. No hepatosplenomegaly. No bruits or masses. Good bowel sounds x 4 Extremities: no cyanosis, clubbing, rash,  LLE ace wrap above knee RLE  2+ edema Neuro: alert & orientedx3, cranial nerves grossly intact. moves all 4 extremities w/o difficulty. Affect pleasant  Telemetry: ST 120  Labs: Basic Metabolic Panel:  Lab 06/28/12 4098 06/27/12 0500 06/26/12 0410 06/25/12 0415 06/24/12 0515 06/23/12 0500  NA 136 135 138 135 139 --  K 3.3* 3.3* 4.3 4.3 4.8 --  CL 93* 92* 98 95* 99 --  CO2 29 29 27 23 19  --  GLUCOSE 143* 159* 214* 336* 129* --  BUN 98* 91* 80* 76* 67* --  CREATININE 5.11* 5.53* 5.42* 5.65* 5.36* --  CALCIUM 8.5 8.5 8.5 -- -- --  MG -- -- -- -- -- 2.2  PHOS 5.9* 5.9* 6.9* 7.8* 8.8* --    Liver Function Tests:  Lab 06/28/12 0410 06/27/12 0500 06/26/12 0410 06/25/12 0415 06/24/12 0515 06/22/12 0424  AST 27 -- 14 -- -- 16  ALT <5 -- <5 -- -- <5  ALKPHOS 65 -- 67 -- -- 69  BILITOT 0.2* -- 0.2* -- -- 0.2*  PROT 5.2* -- 5.0* -- -- 4.7*  ALBUMIN 1.8* 1.8* 1.6* 1.6* 1.6* --   No results found for this basename: LIPASE:5,AMYLASE:5 in the last 168 hours No results found for this basename: AMMONIA:3 in the last 168 hours  CBC:  Lab 06/28/12 0410 06/27/12 0500 06/26/12 0410 06/25/12 0415 06/24/12 0515 06/22/12 0424  WBC 8.8 6.7 6.9 9.2 11.9* --  NEUTROABS -- -- --  7.2 -- 8.3*  HGB 8.6* 8.6* 7.9* 8.7* 12.1* --  HCT 26.2* 26.3* 24.1* 26.2* 36.7* --  MCV 83.4 83.2 83.4 83.7 83.4 --  PLT 331 289 238 265 367 --    Cardiac Enzymes: No results found for this basename: CKTOTAL:5,CKMB:5,CKMBINDEX:5,TROPONINI:5 in the last 168 hours  BNP: BNP (last 3 results)  Basename 06/14/12 1818 03/22/12 0912 10/14/11 1203  PROBNP 24481.0* 41.0 83.0     Other results:  Imaging: No results found.   Medications:     Scheduled Medications:    . allopurinol  200 mg Oral Daily  . calcium acetate  1,334 mg Oral TID WC  . collagenase   Topical Daily  . darbepoetin (ARANESP) injection - NON-DIALYSIS  150 mcg Subcutaneous Q14 Days  . feeding supplement (NEPRO CARB STEADY)  1,000 mL Per Tube  QHS  . feeding supplement  30 mL Per Tube BID  . furosemide  160 mg Oral BID  . heparin subcutaneous  5,000 Units Subcutaneous Q8H  . hydrocortisone cream   Topical BID  . hydrocortisone sodium succinate  100 mg Intravenous Q6H  . insulin aspart  0-20 Units Subcutaneous Q4H  . insulin aspart  3 Units Subcutaneous TID WC  . insulin glargine  20 Units Subcutaneous QHS  . metoCLOPramide  5 mg Per Tube TID AC & HS  . polyethylene glycol  17 g Oral Once  . potassium chloride  20 mEq Oral Once  . ranitidine  150 mg Per Tube QHS  . silver sulfADIAZINE   Topical Daily  . vancomycin  1,500 mg Intravenous Q48H  . DISCONTD: sodium bicarbonate  1,300 mg Oral BID    Infusions:    PRN Medications: sodium chloride, acetaminophen, fentaNYL, lidocaine, magnesium sulfate 1 - 4 g bolus IVPB, ondansetron, oxyCODONE, silver nitrate applicators   Assessment:  1. Mixed cardiomyopathy, baseline ischemic CMP with suspicion of septic cardiomyopathy this admission.  2. CAD S/P multiple stents to RCA 3. Prostate Cancer 4. Cellulitis 5. Septic shock from leg source (deep abscess), MSSA 6. HTN 7. DM 8. OSA 9. NSTEMI: Demand ischemia versus ACS, not candidate for invasive evaluation at this time.  10. ARF: Septic shock with ATN 11. Anemia  12. Nausea, r/o illeus 13. Leukocytoclastic vasculitis.  Plan/Discussion:    Improving urine output, BUN/creatinine essentially stable. JVP not significantly elevated. Diuretics being managed by renal.   Co-ox 73% (excellent).    Would restart low dose of Coreg 3.125 mg bid.  Will need to eventually go back on ASA 81 mg daily (multiple stents) => will ask Dr. Lajoyce Corners when this will be feasible as sounds like he needs to go back to OR this week.   Marca Ancona 06/28/2012 8:19 AM

## 2012-06-28 NOTE — Progress Notes (Signed)
Clinical Social Work  CSW met with patient at bedside to discuss [employer]. CSW reviewed chart which stated [facility] is still following patient and will follow up later this week. CSW spoke with patient regarding dc plans if [facility] is unable to accept. Patient reports that he and wife are still discussing plans but CIR is first option. CSW discussed SNF as alternative option. At this time, patient wants to wait for CIR and LTAC referral before talking about SNF. Patient is aware that when he is medically dc he will have to have a decision made regarding dc plans. CSW will continue to follow.  [place], [place] [phone]

## 2012-06-28 NOTE — Progress Notes (Signed)
ANTIBIOTIC CONSULT NOTE-PROGRESS NOTE  Pharmacy Consult for Vancomycin Indication: Staphylococcus aureus bacteremia with sepsis; Allergy to Ancef  Hospital Problems Principal Problem:  *Staphylococcus aureus bacteremia with sepsis Active Problems:  Secondary DM with peripheral vascular disease, uncontrolled  HYPERTROPHY PROSTATE W/UR OBST & OTH LUTS  CORONARY ATHEROSCLEROSIS NATIVE CORONARY ARTERY  Polymyalgia rheumatica  Systolic CHF, chronic  OSA (obstructive sleep apnea)  Obesity  Cellulitis and abscess of lower leg  Healthcare-associated pneumonia  Hyperglycemia  Acute encephalopathy  NSTEMI, initial episode of care  AKI (acute kidney injury)  Protein-calorie malnutrition, severe  Leucocytoclastic vasculitis   Allergies Allergies  Allergen Reactions  . Atorvastatin     REACTION: sore legs  . Ancef (Cefazolin) Rash    Patient Measurements: Height: 5\' 10"  (177.8 cm) Weight: 241 lb 6.5 oz (109.5 kg) IBW/kg (Calculated) : 73   Vital Signs: Blood pressure 133/69, pulse 85, temperature 97.4 F (36.3 C), temperature source Oral, resp. rate 13, height 5\' 10"  (1.778 m), weight 241 lb 6.5 oz (109.5 kg), SpO2 92.00%.  Intake/Output from previous day: 07/14 0701 - 07/15 0700 In: 2050 [P.O.:1200; NG/GT:350; IV Piggyback:500] Out: 3525 [Urine:3525]  Labs:  Basename 06/28/12 0410 06/27/12 0500 06/26/12 0410  WBC 8.8 6.7 6.9  HGB 8.6* 8.6* 7.9*  PLT 331 289 238  LABCREA -- -- --  CREATININE 5.11* 5.53* 5.42*   @CRCL3 @   Microbiology: Recent Results (from the past 720 hour(s))  CULTURE, BLOOD (ROUTINE X 2)     Status: Normal   Collection Time   06/14/12  1:20 PM      Component Value Range Status Comment   Specimen Description BLOOD ARM LEFT   Final    Special Requests BOTTLES DRAWN AEROBIC ONLY 10CC   Final    Culture  Setup Time 06/15/2012 02:15   Final    Culture     Final    Value: STAPHYLOCOCCUS AUREUS     Note: SUSCEPTIBILITIES PERFORMED ON PREVIOUS  CULTURE WITHIN THE LAST 5 DAYS.     Note: Gram Stain Report Called to,Read Back By and Verified With: BURGUNDY Marcantonio 06/15/12 1400 BY SMITHERSJ   Report Status 06/17/2012 FINAL   Final   CULTURE, BLOOD (ROUTINE X 2)     Status: Normal   Collection Time   06/14/12  1:40 PM      Component Value Range Status Comment   Specimen Description BLOOD LEFT HAND   Final    Special Requests BOTTLES DRAWN AEROBIC AND ANAEROBIC 10CC   Final    Culture  Setup Time 06/15/2012 02:15   Final    Culture     Final    Value: STAPHYLOCOCCUS AUREUS     Note: RIFAMPIN AND GENTAMICIN SHOULD NOT BE USED AS SINGLE DRUGS FOR TREATMENT OF STAPH INFECTIONS.     Note: Gram Stain Report Called to,Read Back By and Verified With: BRANDY DIN @ 1814 ON 06/15/12 BY GOLLD   Report Status 06/17/2012 FINAL   Final    Organism ID, Bacteria STAPHYLOCOCCUS AUREUS   Final   URINE CULTURE     Status: Normal   Collection Time   06/14/12  7:15 PM      Component Value Range Status Comment   Specimen Description URINE, RANDOM   Final    Special Requests NONE   Final    Culture  Setup Time 06/15/2012 01:48   Final    Colony Count NO GROWTH   Final    Culture NO GROWTH   Final  Report Status 06/16/2012 FINAL   Final   MRSA PCR SCREENING     Status: Normal   Collection Time   06/14/12  7:17 PM      Component Value Range Status Comment   MRSA by PCR NEGATIVE  NEGATIVE Final   GRAM STAIN     Status: Normal   Collection Time   06/15/12 12:39 PM      Component Value Range Status Comment   Specimen Description WOUND LEFT LOWER LEG   Final    Special Requests NONE   Final    Gram Stain     Final    Value: ABUNDANT WBC PRESENT,BOTH PMN AND MONONUCLEAR     ABUNDANT GRAM POSITIVE COCCI IN PAIRS IN CLUSTERS   Report Status 06/15/2012 FINAL   Final   ANAEROBIC CULTURE     Status: Normal   Collection Time   06/15/12 12:39 PM      Component Value Range Status Comment   Specimen Description WOUND LEFT LOWER LEG   Final    Special Requests NONE    Final    Gram Stain     Final    Value: ABUNDANT WBC PRESENT,BOTH PMN AND MONONUCLEAR     ABUNDANT GRAM POSITIVE COCCI IN PAIRS     IN CLUSTERS Performed at [facility]   Culture NO ANAEROBES ISOLATED   Final    Report Status 06/20/2012 FINAL   Final   WOUND CULTURE     Status: Normal (Preliminary result)   Collection Time   06/15/12 12:39 PM      Component Value Range Status Comment   Specimen Description WOUND LEFT LOWER LEG   Final    Special Requests NONE   Final    Gram Stain PENDING   Incomplete    Culture     Final    Value: ABUNDANT STAPHYLOCOCCUS AUREUS     Note: RIFAMPIN AND GENTAMICIN SHOULD NOT BE USED AS SINGLE DRUGS FOR TREATMENT OF STAPH INFECTIONS.   Report Status PENDING   Incomplete    Organism ID, Bacteria STAPHYLOCOCCUS AUREUS   Final   CULTURE, BLOOD (ROUTINE X 2)     Status: Normal   Collection Time   06/18/12  5:33 PM      Component Value Range Status Comment   Specimen Description BLOOD LEFT HAND   Final    Special Requests BOTTLES DRAWN AEROBIC ONLY [facility]   Final    Culture  Setup Time 06/19/2012 01:57   Final    Culture NO GROWTH 5 DAYS   Final    Report Status 06/25/2012 FINAL   Final   CULTURE, BLOOD (ROUTINE X 2)     Status: Normal   Collection Time   06/18/12  5:48 PM      Component Value Range Status Comment   Specimen Description BLOOD LEFT ARM   Final    Special Requests BOTTLES DRAWN AEROBIC ONLY 4CC   Final    Culture  Setup Time 06/19/2012 01:57   Final    Culture NO GROWTH 5 DAYS   Final    Report Status 06/25/2012 FINAL   Final   CULTURE, BLOOD (ROUTINE X 2)     Status: Normal   Collection Time   06/19/12  9:09 PM      Component Value Range Status Comment   Specimen Description BLOOD RIGHT HAND   Final    Special Requests BOTTLES DRAWN AEROBIC ONLY 5CC   Final    Culture  Setup Time 06/20/2012 03:01   Final    Culture NO GROWTH 5 DAYS   Final    Report Status 06/26/2012 FINAL   Final   CULTURE, BLOOD (ROUTINE X 2)     Status: Normal    Collection Time   06/19/12  9:09 PM      Component Value Range Status Comment   Specimen Description BLOOD RIGHT ARM   Final    Special Requests BOTTLES DRAWN AEROBIC ONLY 5CC   Final    Culture  Setup Time 06/20/2012 03:01   Final    Culture NO GROWTH 5 DAYS   Final    Report Status 06/26/2012 FINAL   Final     Anti-infectives Anti-infectives     Start     Dose/Rate Route Frequency Ordered Stop   06/25/12 1300   vancomycin (VANCOCIN) 1,500 mg in sodium chloride 0.9 % 500 mL IVPB        1,500 mg 250 mL/hr over 120 Minutes Intravenous Every 48 hours 06/23/12 1153     06/23/12 1300   vancomycin (VANCOCIN) 2,000 mg in sodium chloride 0.9 % 500 mL IVPB        2,000 mg 250 mL/hr over 120 Minutes Intravenous  Once 06/23/12 1153 06/23/12 1444   06/21/12 2200   ceFAZolin (ANCEF) IVPB 1 g/50 mL premix  Status:  Discontinued        1 g 100 mL/hr over 30 Minutes Intravenous Every 12 hours 06/21/12 0941 06/23/12 1056   06/18/12 1000   ceFAZolin (ANCEF) IVPB 2 g/50 mL premix  Status:  Discontinued        2 g 100 mL/hr over 30 Minutes Intravenous Every 12 hours 06/18/12 0910 06/21/12 0941   06/17/12 1800   imipenem-cilastatin (PRIMAXIN) 500 mg in sodium chloride 0.9 % 100 mL IVPB  Status:  Discontinued        500 mg 200 mL/hr over 30 Minutes Intravenous 4 times per day 06/17/12 1426 06/18/12 0909   06/16/12 0000   imipenem-cilastatin (PRIMAXIN) 250 mg in sodium chloride 0.9 % 100 mL IVPB  Status:  Discontinued        250 mg 200 mL/hr over 30 Minutes Intravenous 4 times per day 06/15/12 1900 06/17/12 1426   06/15/12 2200   linezolid (ZYVOX) IVPB 600 mg  Status:  Discontinued        600 mg 300 mL/hr over 60 Minutes Intravenous Every 12 hours 06/15/12 1712 06/18/12 0909   06/15/12 1900   clindamycin (CLEOCIN) IVPB 600 mg  Status:  Discontinued        600 mg 100 mL/hr over 30 Minutes Intravenous 3 times per day 06/15/12 1126 06/16/12 0820   06/15/12 1800   clindamycin (CLEOCIN) IVPB 600 mg   Status:  Discontinued        600 mg 100 mL/hr over 30 Minutes Intravenous 4 times per day 06/15/12 1125 06/15/12 1126   06/15/12 1800   imipenem-cilastatin (PRIMAXIN) 500 mg in sodium chloride 0.9 % 100 mL IVPB  Status:  Discontinued        500 mg 200 mL/hr over 30 Minutes Intravenous 3 times per day 06/15/12 1728 06/15/12 1900   06/15/12 1230   vancomycin (VANCOCIN) powder 1,000 mg  Status:  Discontinued        1,000 mg Other To Surgery 06/15/12 1215 06/15/12 1712   06/15/12 1222   vancomycin (VANCOCIN) powder  Status:  Discontinued          As needed 06/15/12  1223 06/15/12 1323   06/15/12 1220   gentamicin (GARAMYCIN) injection  Status:  Discontinued          As needed 06/15/12 1222 06/15/12 1323   06/15/12 1130   clindamycin (CLEOCIN) IVPB 900 mg        900 mg 100 mL/hr over 30 Minutes Intravenous STAT 06/15/12 1123 06/15/12 1235   06/15/12 0600   vancomycin (VANCOCIN) IVPB 1000 mg/200 mL premix  Status:  Discontinued        1,000 mg 200 mL/hr over 60 Minutes Intravenous Every 12 hours 06/14/12 1745 06/15/12 1712   06/15/12 0200   piperacillin-tazobactam (ZOSYN) IVPB 3.375 g  Status:  Discontinued        3.375 g 12.5 mL/hr over 240 Minutes Intravenous Every 8 hours 06/14/12 1745 06/15/12 1657   06/14/12 1615   vancomycin (VANCOCIN) IVPB 1000 mg/200 mL premix        1,000 mg 200 mL/hr over 60 Minutes Intravenous  Once 06/14/12 1608 06/14/12 1924   06/14/12 1615  piperacillin-tazobactam (ZOSYN) IVPB 3.375 g       3.375 g 100 mL/hr over 30 Minutes Intravenous  Once 06/14/12 1608 06/14/12 1854   06/14/12 1600   vancomycin (VANCOCIN) IVPB 1000 mg/200 mL premix        1,000 mg 200 mL/hr over 60 Minutes Intravenous  Once 06/14/12 1547 06/14/12 1721   06/14/12 1545   vancomycin (VANCOCIN) injection 1,000 mg  Status:  Discontinued        1,000 mg Intravenous  Once 06/14/12 1532 06/14/12 1547          Assessment:  71 y/o male with Staph aureus bacteremia with sepsis likely  d/t abscess and cellulitis of lower leg  S/P I & D of abscess and start of antibiotics.  Day # 15 (total antibiotic coverage)  Day # 6 of Vancomycin 1500 mg IV q 48 hours.  CrCl 17.1 ml/min,  WBC 8.8.  Goal of Therapy:   Vancomycin trough level 15-20 mcg/ml  Plan:   Will draw Vancomycin trough level prior to the next dose, 1300 pm 06/29/12.  Adjust Vancomycin as indicated.  Kaileah Shevchenko, Elisha Headland, Pharm.D.  06/28/2012 12:04 PM

## 2012-06-29 LAB — CARBOXYHEMOGLOBIN
Methemoglobin: 1.5 % (ref 0.0–1.5)
Total hemoglobin: 9 g/dL — ABNORMAL LOW (ref 13.5–18.0)

## 2012-06-29 LAB — BASIC METABOLIC PANEL
BUN: 98 mg/dL — ABNORMAL HIGH (ref 6–23)
BUN: 97 mg/dL — ABNORMAL HIGH (ref 6–23)
CO2: 29 mEq/L (ref 19–32)
Calcium: 8.5 mg/dL (ref 8.4–10.5)
Calcium: 8.5 mg/dL (ref 8.4–10.5)
Chloride: 95 mEq/L — ABNORMAL LOW (ref 96–112)
Creatinine, Ser: 4.74 mg/dL — ABNORMAL HIGH (ref 0.50–1.35)
GFR calc Af Amer: 13 mL/min — ABNORMAL LOW (ref >90)
Glucose, Bld: 123 mg/dL — ABNORMAL HIGH (ref 70–99)
Sodium: 136 mEq/L (ref 135–145)

## 2012-06-29 LAB — GLUCOSE, CAPILLARY
Glucose-Capillary: 168 mg/dL — ABNORMAL HIGH (ref 70–99)
Glucose-Capillary: 85 mg/dL (ref 70–99)
Glucose-Capillary: 273 mg/dL — ABNORMAL HIGH (ref 70–99)

## 2012-06-29 LAB — CBC
HCT: 27.5 % — ABNORMAL LOW (ref 39.0–52.0)
Hemoglobin: 9 g/dL — ABNORMAL LOW (ref 13.0–17.0)
MCH: 27.7 pg (ref 26.0–34.0)
MCHC: 32.7 g/dL (ref 30.0–36.0)
MCV: 84.6 fL (ref 78.0–100.0)
MCV: 84.7 fL (ref 78.0–100.0)
Platelets: 299 10*3/uL (ref 150–400)
RBC: 3.21 MIL/uL — ABNORMAL LOW (ref 4.22–5.81)

## 2012-06-29 LAB — VANCOMYCIN, TROUGH: Vancomycin Tr: 32.4 ug/mL (ref 10.0–20.0)

## 2012-06-29 MED ORDER — PANTOPRAZOLE SODIUM 40 MG IV SOLR
40.0000 mg | Freq: Every day | INTRAVENOUS | Status: DC
  Administered 2012-06-29: 40 mg via INTRAVENOUS
  Filled 2012-06-29 (×2): qty 40

## 2012-06-29 MED ORDER — SIMETHICONE 40 MG/0.6ML PO SUSP
40.0000 mg | Freq: Four times a day (QID) | ORAL | Status: DC | PRN
  Administered 2012-06-29: 40 mg via ORAL
  Filled 2012-06-29 (×3): qty 0.6

## 2012-06-29 MED ORDER — SODIUM CHLORIDE 0.9 % IJ SOLN
INTRAMUSCULAR | Status: AC
  Administered 2012-06-29: 03:00:00
  Filled 2012-06-29: qty 10

## 2012-06-29 MED ORDER — INSULIN ASPART 100 UNIT/ML ~~LOC~~ SOLN
3.0000 [IU] | Freq: Three times a day (TID) | SUBCUTANEOUS | Status: DC
  Administered 2012-07-01 – 2012-07-02 (×4): 3 [IU] via SUBCUTANEOUS

## 2012-06-29 MED ORDER — INSULIN GLARGINE 100 UNIT/ML ~~LOC~~ SOLN
20.0000 [IU] | Freq: Every day | SUBCUTANEOUS | Status: DC
  Administered 2012-06-30 – 2012-07-01 (×2): 20 [IU] via SUBCUTANEOUS

## 2012-06-29 MED ORDER — SODIUM CHLORIDE 0.9 % IJ SOLN
INTRAMUSCULAR | Status: AC
  Administered 2012-06-29: 10 mL
  Filled 2012-06-29: qty 10

## 2012-06-29 MED ORDER — SODIUM CHLORIDE 0.9 % IJ SOLN
INTRAMUSCULAR | Status: AC
  Filled 2012-06-29: qty 30

## 2012-06-29 NOTE — Progress Notes (Signed)
Occupational Therapy Treatment Patient Details Name: Wayne [name] MRN: [id number] DOB: Jun 19, 1941 Today's Date: 06/29/2012 Time: 4098-1191 OT Time Calculation (min): 22 min  OT Assessment / Plan / Recommendation Comments on Treatment Session Pt. with improved bed mobility and transfers today.    Follow Up Recommendations  Inpatient Rehab    Barriers to Discharge       Equipment Recommendations  Defer to next venue    Recommendations for Other Services Rehab consult  Frequency Min 2X/week   Plan Discharge plan remains appropriate    Precautions / Restrictions Precautions Precautions: Fall Restrictions Weight Bearing Restrictions: No   Pertinent Vitals/Pain Vitals Monitored and stable. Pain in LLE but did not quantify.    ADL  Grooming: Performed;Set up Where Assessed - Grooming: Supine, head of bed up Toilet Transfer: Simulated;+2 Total assistance Toilet Transfer: Patient Percentage: 80% Toilet Transfer Method: Stand pivot Toilet Transfer Equipment: Comfort height toilet Equipment Used: Gait belt ADL Comments: Pt. transfererred from bed to chair with total+2 80% A. Pt. demonstrated bicep/tricep and shoulder flexion exercises on BUE with weighted object (using water bottle in the room) for approx 10-15 reps using water bottle in the room     OT Goals Acute Rehab OT Goals OT Goal Formulation: With patient Time For Goal Achievement: 07/06/12 Potential to Achieve Goals: Good ADL Goals Pt Will Perform Grooming: with supervision;Standing at sink ADL Goal: Grooming - Progress: Not progressing Pt Will Perform Lower Body Bathing: with min assist;Sit to stand from chair;Sit to stand from bed Pt Will Perform Lower Body Dressing: Sit to stand from chair;Sit to stand from bed;with min assist;with adaptive equipment Pt Will Transfer to Toilet: with supervision;Ambulation;with DME;3-in-1 ADL Goal: Toilet Transfer - Progress: Progressing toward goals Pt Will Perform Toileting -  Clothing Manipulation: with supervision;Standing Pt Will Perform Toileting - Hygiene: with supervision;with set-up;Sit to stand from 3-in-1/toilet;Sitting on 3-in-1 or toilet Additional ADL Goal #1: Pt will be I with bil UE strengthening HEP in prep for UB use with ADLs ADL Goal: Additional Goal #1 - Progress: Progressing toward goals  Visit Information  Last OT Received On: 06/29/12 Assistance Needed: +2 PT/OT Co-Evaluation/Treatment: Yes    Subjective Data   Pt. Motivated to participate in Bil UE exercises.    Prior Functioning       Cognition  Overall Cognitive Status: Appears within functional limits for tasks assessed/performed Arousal/Alertness: Awake/alert Orientation Level: Appears intact for tasks assessed Behavior During Session: [facility] for tasks performed    Mobility Bed Mobility Bed Mobility: Supine to Sit;Sitting - Scoot to Edge of Bed Supine to Sit: 1: +2 Total assist;With rails Supine to Sit: Patient Percentage: 80% Sitting - Scoot to Edge of Bed: 4: Min assist Details for Bed Mobility Assistance: assist for trunk balance while scooting to EOB as well as assist with moving LLE off bed.  Transfers Transfers: Sit to Stand;Stand to Sit Sit to Stand: From bed;With upper extremity assist;1: +2 Total assist Sit to Stand: Patient Percentage: 80% Stand to Sit: To chair/3-in-1;With upper extremity assist;1: +2 Total assist Stand to Sit: Patient Percentage: 80%           End of Session OT - End of Session Activity Tolerance: Patient tolerated treatment well Patient left: in chair;with call bell/phone within reach  GO     Jenell Milliner 06/29/2012, 2:17 PM  I agree with the following treatment note after reviewing documentation.   Harrel Carina Tancred   OTR/L Pager: [phone] Office: [phone] .

## 2012-06-29 NOTE — Progress Notes (Signed)
Physical Therapy Treatment Patient Details Name: Wayne [name] MRN: [id number] DOB: 21-Oct-1941 Today's Date: 06/29/2012 Time: 6295-2841 PT Time Calculation (min): 31 min  PT Assessment / Plan / Recommendation Comments on Treatment Session  Pt admitted with left LE wound and continues to progress with therapy.  Pt able to tolerate OOB to chair with assist for balance and due to weakness.  Motivated.    Follow Up Recommendations  Inpatient Rehab    Barriers to Discharge        Equipment Recommendations  Defer to next venue    Recommendations for Other Services    Frequency Min 3X/week   Plan Discharge plan remains appropriate;Frequency remains appropriate    Precautions / Restrictions Precautions Precautions: Fall Restrictions Weight Bearing Restrictions: No   Pertinent Vitals/Pain 2/10 in left LE.  Pt repositioned.    Mobility  Bed Mobility Bed Mobility: Supine to Sit Supine to Sit: 1: +2 Total assist;With rails;HOB elevated (HOB 20 degrees.) Supine to Sit: Patient Percentage: 80% Sitting - Scoot to Edge of Bed: 4: Min assist Details for Bed Mobility Assistance: assist for trunk balance while scooting to EOB as well as assist with moving LLE off bed.  Transfers Transfers: Sit to Stand;Stand to Sit;Stand Pivot Transfers Sit to Stand: 1: +2 Total assist;With upper extremity assist;From bed Sit to Stand: Patient Percentage: 80% Stand to Sit: 1: +2 Total assist;With upper extremity assist;To chair/3-in-1 Stand to Sit: Patient Percentage: 80% Stand Pivot Transfers: 1: +2 Total assist Stand Pivot Transfers: Patient Percentage: 80% Details for Transfer Assistance: Assist to translate trunk anterior over BOS while off weighting left LE due to pain.  Cues for safest hand placement and sequence.  Assist also to facilitate rotation of hips to chair. Ambulation/Gait Ambulation/Gait Assistance: Not tested (comment) Stairs: No Wheelchair Mobility Wheelchair Mobility: No      Exercises General Exercises - Lower Extremity Heel Slides: AROM;Right;20 reps;Seated (Resisted with level 3 theraband.)   PT Diagnosis:    PT Problem List:   PT Treatment Interventions:     PT Goals Acute Rehab PT Goals PT Goal Formulation: With patient Time For Goal Achievement: 07/13/12 Potential to Achieve Goals: Good PT Goal: Supine/Side to Sit - Progress: Progressing toward goal PT Goal: Sit to Stand - Progress: Progressing toward goal PT Transfer Goal: Bed to Chair/Chair to Bed - Progress: Progressing toward goal  Visit Information  Last PT Received On: 06/29/12 Assistance Needed: +2 PT/OT Co-Evaluation/Treatment: Yes    Subjective Data  Subjective: "I am feeling much better now." Patient Stated Goal: Back home   Cognition  Overall Cognitive Status: Appears within functional limits for tasks assessed/performed Arousal/Alertness: Awake/alert Orientation Level: Appears intact for tasks assessed Behavior During Session: [facility] for tasks performed    Balance  Balance Balance Assessed: No  End of Session PT - End of Session Equipment Utilized During Treatment: Gait belt Activity Tolerance: Patient tolerated treatment well Patient left: in chair;with call bell/phone within reach Nurse Communication: Mobility status   GP     Cephus Shelling 06/29/2012, 2:19 PM  06/29/2012 Cephus Shelling, PT, DPT [phone]

## 2012-06-29 NOTE — Progress Notes (Signed)
Calorie Count Note  48 hour calorie count ordered.  Nocturnal TF is on hold.  Diet: Renal 80/90-2-2 with 1200 ml fluid restriction Supplements: Ensure Complete PO TID  Lunch 7/15: 385 kcals, 11 grams protein Dinner 7/15: 145 kcals, 8 grams protein Breakfast 7/16: 186 kcals, 7 grams protein Supplements: 350 kcals, 13 grams protein  Total intake: 1066 kcal (46% of minimum estimated needs)  39 gm protein (30% of minimum estimated needs)  Nutrition Dx: Inadequate oral intake, ongoing.  Goal:  Intake to meet >90% of estimated nutrition needs to support healing, unmet.  Intervention:   Continue Calorie Count for 1 more day.  Continue Ensure Complete PO TID.  Recommend liberalize diet to regular to help improve oral intake.   Joaquin Courts, RD, CNSC, LDN Pager# [phone] After Hours Pager# [phone]

## 2012-06-29 NOTE — Progress Notes (Signed)
ANTIBIOTIC CONSULT NOTE - FOLLOW UP  Pharmacy Consult for Vancomycin Indication: Staphylococcus aureus bacteremia with sepsis; Allergy to Ancef  Allergies  Allergen Reactions  . Atorvastatin     REACTION: sore legs  . Ancef (Cefazolin) Rash    Patient Measurements: Height: 5\' 10"  (177.8 cm) Weight: 234 lb 5.6 oz (106.3 kg) IBW/kg (Calculated) : 73   Vital Signs: Temp: 98.4 F (36.9 C) (07/16 1100) Temp src: Oral (07/16 1100) BP: 123/60 mmHg (07/16 1100) Pulse Rate: 97  (07/16 0809) Intake/Output from previous day: 07/15 0701 - 07/16 0700 In: 1430 [P.O.:1320; I.V.:10; NG/GT:100] Out: 1678 [Urine:1675; Stool:3] UOP 1.3 ml/kg/hr  Labs:  [facility] 06/29/12 1145 06/29/12 0419 06/28/12 0410  WBC 12.0* 11.6* 8.8  HGB 8.9* 9.0* 8.6*  PLT 299 317 331  LABCREA -- -- --  CREATININE 4.67* 4.74* 5.11*   Estimated Creatinine Clearance: 18.5 ml/min (by C-G formula based on Cr of 4.67).  Basename 06/29/12 1145  VANCOTROUGH 32.4*  VANCOPEAK --  Drue Dun --  GENTTROUGH --  GENTPEAK --  GENTRANDOM --  TOBRATROUGH --  TOBRAPEAK --  TOBRARND --  AMIKACINPEAK --  AMIKACINTROU --  AMIKACIN --       Anti-infectives     Start     Dose/Rate Route Frequency Ordered Stop   06/25/12 1300   vancomycin (VANCOCIN) 1,500 mg in sodium chloride 0.9 % 500 mL IVPB  Status:  Discontinued        1,500 mg 250 mL/hr over 120 Minutes Intravenous Every 48 hours 06/23/12 1153 06/29/12 1355   06/23/12 1300   vancomycin (VANCOCIN) 2,000 mg in sodium chloride 0.9 % 500 mL IVPB        2,000 mg 250 mL/hr over 120 Minutes Intravenous  Once 06/23/12 1153 06/23/12 1444   06/21/12 2200   ceFAZolin (ANCEF) IVPB 1 g/50 mL premix  Status:  Discontinued        1 g 100 mL/hr over 30 Minutes Intravenous Every 12 hours 06/21/12 0941 06/23/12 1056   06/18/12 1000   ceFAZolin (ANCEF) IVPB 2 g/50 mL premix  Status:  Discontinued        2 g 100 mL/hr over 30 Minutes Intravenous Every 12 hours 06/18/12  0910 06/21/12 0941   06/17/12 1800   imipenem-cilastatin (PRIMAXIN) 500 mg in sodium chloride 0.9 % 100 mL IVPB  Status:  Discontinued        500 mg 200 mL/hr over 30 Minutes Intravenous 4 times per day 06/17/12 1426 06/18/12 0909   06/16/12 0000   imipenem-cilastatin (PRIMAXIN) 250 mg in sodium chloride 0.9 % 100 mL IVPB  Status:  Discontinued        250 mg 200 mL/hr over 30 Minutes Intravenous 4 times per day 06/15/12 1900 06/17/12 1426   06/15/12 2200   linezolid (ZYVOX) IVPB 600 mg  Status:  Discontinued        600 mg 300 mL/hr over 60 Minutes Intravenous Every 12 hours 06/15/12 1712 06/18/12 0909   06/15/12 1900   clindamycin (CLEOCIN) IVPB 600 mg  Status:  Discontinued        600 mg 100 mL/hr over 30 Minutes Intravenous 3 times per day 06/15/12 1126 06/16/12 0820   06/15/12 1800   clindamycin (CLEOCIN) IVPB 600 mg  Status:  Discontinued        600 mg 100 mL/hr over 30 Minutes Intravenous 4 times per day 06/15/12 1125 06/15/12 1126   06/15/12 1800   imipenem-cilastatin (PRIMAXIN) 500 mg in sodium chloride 0.9 %  100 mL IVPB  Status:  Discontinued        500 mg 200 mL/hr over 30 Minutes Intravenous 3 times per day 06/15/12 1728 06/15/12 1900   06/15/12 1230   vancomycin (VANCOCIN) powder 1,000 mg  Status:  Discontinued        1,000 mg Other To Surgery 06/15/12 1215 06/15/12 1712   06/15/12 1222   vancomycin (VANCOCIN) powder  Status:  Discontinued          As needed 06/15/12 1223 06/15/12 1323   06/15/12 1220   gentamicin (GARAMYCIN) injection  Status:  Discontinued          As needed 06/15/12 1222 06/15/12 1323   06/15/12 1130   clindamycin (CLEOCIN) IVPB 900 mg        900 mg 100 mL/hr over 30 Minutes Intravenous STAT 06/15/12 1123 06/15/12 1235   06/15/12 0600   vancomycin (VANCOCIN) IVPB 1000 mg/200 mL premix  Status:  Discontinued        1,000 mg 200 mL/hr over 60 Minutes Intravenous Every 12 hours 06/14/12 1745 06/15/12 1712   06/15/12 0200   piperacillin-tazobactam  (ZOSYN) IVPB 3.375 g  Status:  Discontinued        3.375 g 12.5 mL/hr over 240 Minutes Intravenous Every 8 hours 06/14/12 1745 06/15/12 1657   06/14/12 1615   vancomycin (VANCOCIN) IVPB 1000 mg/200 mL premix        1,000 mg 200 mL/hr over 60 Minutes Intravenous  Once 06/14/12 1608 06/14/12 1924   06/14/12 1615  piperacillin-tazobactam (ZOSYN) IVPB 3.375 g       3.375 g 100 mL/hr over 30 Minutes Intravenous  Once 06/14/12 1608 06/14/12 1854   06/14/12 1600   vancomycin (VANCOCIN) IVPB 1000 mg/200 mL premix        1,000 mg 200 mL/hr over 60 Minutes Intravenous  Once 06/14/12 1547 06/14/12 1721   06/14/12 1545   vancomycin (VANCOCIN) injection 1,000 mg  Status:  Discontinued        1,000 mg Intravenous  Once 06/14/12 1532 06/14/12 1547          Assessment:  Day # 7 Vancomycin, Day # 16 total antibiotics for MSSA bacteremia/sepsis with L-calf abscess  Patient allergic to Ancef  SCr trending down, renal function slowly improving  Vancomycin trough level today 32.4 mcg/ml  Goal of Therapy:   Vancomycin trough level 15-20 mcg/ml  Plan:   Hold Vancomycin for now.  Will draw Random Vancomycin level 7/18 with AM labs and resume Vancomycin as indicated.  Kadajah Kjos, Elisha Headland, Pharm.D.  06/29/2012 2:16 PM

## 2012-06-29 NOTE — Progress Notes (Signed)
Patient on the schedule for OR tomorrow.   NPO past midnight; hold lantus and cont CBG checks and coverage as necessary.

## 2012-06-29 NOTE — Progress Notes (Signed)
Name: Wayne [name] MRN: [id number] DOB: 1941-10-23    LOS: 15 Date of admit 06/14/2012 12:48 PM PCP is Kristian Covey, MD Ortho is Dr Jonathon Bellows  Referring Provider:  Dr Preston Fleeting - ER Reason for Referral:  Sepsis, hypotension, acidosis , hyperglycemia    PULMONARY / CRITICAL CARE MEDICINE  71 yr old septic shock, resp failure, leg source. S/p irrigation and debridement of LE on 7/2.  Events Since Admission: 7/2- refractory shock post op, acidosis, arf, epi required 7/2 irrigation and debridement of LLE 7/2 2 units PRBC 7/3- Improved pressors 7/5- Off all pressors 7/6 2 units PRBC 7/8>>> SDU 7/13> No overnight issues 7/14> stable, seen by [place] Ouch, Deterding, Terri Piedra...  Overnight: Tolerated dressing change today Taking PO but swallowing hurts due to Panda Coreg restarted 7/15 Possibly back to OR for debridement/wound vac 7/17  Current Status: No acute distress, eating better with reglan.  Pos BMs.    Lines/Tubes: ETT 7/1>>7/5 NG Tube 7/1 >>7/5 Foley 7/1>>> RIJ 7/1>>>dc  R Radial 7/1>>>7/4 RPIV 7/1>>>7/4 LPIV 7/1>>> Premrose Drains7/2>>>7/5 L IJ HD catheter 7/4>>  ABX: Vanc 7/1 >>> 7/2 Zosyn 7/1 >>> 7/2 clinda7/2>>>7/3 Linezolid 7/2>>>7/5 Imipenim 7/2>>>7/5 Cefazolin ( MSSA cellulitis/abscess LLE per ID) 7/5 >>>HELD 7/10 d/t rash>>7/10 7/10 Vanco (MSSA, allergic to ceph, per ID)>>  Vital Signs: Temp:  [97.2 F (36.2 C)-98.4 F (36.9 C)] 98 F (36.7 C) (07/16 0811) Pulse Rate:  [80-97] 97  (07/16 0809) Resp:  [15-18] 18  (07/16 0809) BP: (121-142)/(48-79) 142/66 mmHg (07/16 0809) SpO2:  [93 %-98 %] 93 % (07/16 0809) Weight:  [106.3 kg (234 lb 5.6 oz)] 106.3 kg (234 lb 5.6 oz) (07/16 0446)  Physical Examination: General: NAD, improved color, mentation. HEENT: NCAT, EOMi, HD cath in place PULM: CTA B CV: RRR, no mgr AB: BS hypoactive , soft, nontender   Ext:  dry dressings, tender LLE Neuro: A&Ox4 Skin: diffuse vesicular hemorrhagic/  erythematous rash > vesicles starting to dry some   Principal Problem:  *Staphylococcus aureus bacteremia with sepsis Active Problems:  Secondary DM with peripheral vascular disease, uncontrolled  HYPERTROPHY PROSTATE W/UR OBST & OTH LUTS  CORONARY ATHEROSCLEROSIS NATIVE CORONARY ARTERY  Polymyalgia rheumatica  Systolic CHF, chronic  OSA (obstructive sleep apnea)  Obesity  Cellulitis and abscess of lower leg  Healthcare-associated pneumonia  Hyperglycemia  Acute encephalopathy  NSTEMI, initial episode of care  AKI (acute kidney injury)  Protein-calorie malnutrition, severe  Leucocytoclastic vasculitis   ASSESSMENT AND PLAN  PULMONARY  Lab 06/29/12 0424 06/28/12 0424 06/27/12 0636 06/26/12 0350 06/25/12 0425  PHART -- -- -- -- --  PCO2ART -- -- -- -- --  PO2ART -- -- -- -- --  HCO3 -- -- -- -- --  O2SAT 89.5 73.3 57.3 85.5 79.5   CXR:  7/11:  No edema, lines ok ETT: 7/1>>>7/5  A:   Acute Resp Distress due to metabolic acidosis +/- LLL PNA,  ARDS.  Resolved Doing well since extubation 7/5. No supplemental O2 requirement. Pt w/ h/o OSA.   P:   -not requiring CPAP & staff notes resting well, no snoring, not restless, etc.    CARDIOVASCULAR No results found for this basename: TROPONINI:5,LATICACIDVEN:5, O2SATVEN:5,PROBNP:5 in the last 168 hours  Lab 06/29/12 0419 06/28/12 0410 06/27/12 0500  WBC 11.6* 8.8 6.7  HGB 9.0* 8.6* 8.6*  HCT 27.5* 26.2* 26.3*  PLT 317 331 289   ECG:  none Lines:  RIJ 7/1>>>7/4 L IJ HD 7/4>> LPIV 7/1>>>  A:  Severe sepsis/septic shock  d/t staph leg abscess and bacteremia (MSSA)-  has resolved.-  Pt EF of 45% 2009. Echo  showing EF 15%, diffuse hypo. 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   P:  -Per cardiology team and appreciate input -repeat TTE when cards deems appropriate - hopefully he will regain some LV fxn if this was sepsis-induced CM -SQ heparin -coreg restarted 7/15 at low dose -note plans to restart ASA when OK  from surgical standpoint   RENAL  Lab 06/29/12 0419 06/28/12 0410 06/27/12 0500 06/26/12 0410 06/25/12 0415 06/24/12 0515 06/23/12 0500  NA 137 136 135 138 135 -- --  K 3.0* 3.3* -- -- -- -- --  CL 95* 93* 92* 98 95* -- --  CO2 29 29 29 27 23  -- --  BUN 98* 98* 91* 80* 76* -- --  CREATININE 4.74* 5.11* 5.53* 5.42* 5.65* -- --  CALCIUM 8.5 8.5 8.5 8.5 8.2* -- --  MG -- -- -- -- -- -- 2.2  PHOS -- 5.9* 5.9* 6.9* 7.8* 8.8* --   Intake/Output      07/15 0701 - 07/16 0700 07/16 0701 - 07/17 0700   P.O. 1320    I.V. (mL/kg) 10 (0.1)    NG/GT 100    IV Piggyback     Total Intake(mL/kg) 1430 (13.5)    Urine (mL/kg/hr) 1675 (0.7) 500   Stool 3    Total Output 1678 500   Net -248 -500         Foley: 7/1>>>  A:   Sepsis induced ATN/ARF slowly better, now in post ATN diuresis phase. S Cr finally starting to improve > hopefully will recover renal fxn.   P:   -Per renal, no HD required at this time, fxn starting to return -Lasix held 7/14 per Nephrology   GASTROINTESTINAL  Lab 06/28/12 0410 06/27/12 0500 06/26/12 0410 06/25/12 0415 06/24/12 0515  AST 27 -- 14 -- --  ALT <5 -- <5 -- --  ALKPHOS 65 -- 67 -- --  BILITOT 0.2* -- 0.2* -- --  PROT 5.2* -- 5.0* -- --  ALBUMIN 1.8* 1.8* 1.6* 1.6* 1.6*   KUB 7/11: mild ileus d/t renal issues, cardiac output  A:   MOD  ileus. Abdominal bloating , not taking POs, severe protein calorie malnutrition, wound not healing, ?element gastroparesis with DM  P:   -Transpyloric FT in place -has had issues with adequate PO intake.  7/15 plan for calorie count during day and hold TF at night.  Patient has little appetite during day. Will wait for calorie count before d/c Panda, but believe he will take better PO without it -changed reglan to PO -H2 blocker   HEMATOLOGIC  Lab 06/29/12 0419 06/28/12 0410 06/27/12 0500 06/26/12 0410 06/25/12 0415  HGB 9.0* 8.6* 8.6* 7.9* 8.7*  HCT 27.5* 26.2* 26.3* 24.1* 26.2*  PLT 317 331 289 238 265  INR  -- -- -- -- --  APTT -- -- -- -- --   A:   Hgb trend +/- stable  P:  -no tfrx unless Hgb < 7.0 -7/13> Hg=7.9, 7/14> Hg=8.6, following    INFECTIOUS  Lab 06/29/12 0419 06/28/12 0410 06/27/12 0500 06/26/12 0410 06/25/12 0415  WBC 11.6* 8.8 6.7 6.9 9.2  PROCALCITON -- -- -- -- --   Cultures: 7/1 BCX: >>> SA pansens 7/1 BCX:>>> SA 7/1 UCX >>>  No growth 7/2 Wound CX: >>> MSSA  ABX:  Per ID SVC Vanc 7/1 >>> 7/2 Zosyn 7/1 >>> 7/2 7/2 clinda>>>  7/3 Linezolid 7/2>>>7/5 Imipenim 7/2>>>7/5 Ancef 7/5>>>HELD for rash >>d/c 7/10 Vanco 7/10 (MSSA, ceph allergic)>>  A:   Bacteremia and Sepsis w/ SA that is pan sensitive.  Cellulitis LLE w/ Abscess, s/p irriagation and debridement.   Rash now on arms/legs ??d/t ceph allergy, possibly viral exanthem?  P:   -abx per ID -seen by Dr Terri Piedra, Leukocytoclastic vasculitis, bx taken & path confirms, on Solucortef 100mg  Q6h >> changed to pred 40 on 7/15 -continue steroids until vesicles start to dry out, then plan taper over 7-14 days    ENDOCRINE  Lab 06/29/12 0803 06/29/12 0343 06/28/12 2320 06/28/12 2000 06/28/12 1705  GLUCAP 97 85 168* 146* 155*   A:    Blood sugars elevated d/t TF, Hx of DM2  P:   -Resume Lantus==> increase to 20u w/ Steroids -SSI to resistant scale   NEUROLOGIC  A:   Pain   P:   -Prn fentanyl, add one time PRN additional dose fentanyl for dressing changes -lidoderm patch   BEST PRACTICE / DISPOSITION Level of Care:  SDU Primary Service:  pccm Consultants:  Ortho Dr Lajoyce Corners , ID, [place] Cards  Code Status:  Full  Diet:  PO daytime, QHS TF DVT Px:  Hep SQ GI Px: PO  H2 blocker Skin Integrity:  Intact other than LLE Social / Family:    Levy Pupa, MD, PhD 06/29/2012, 10:03 AM [facility] [phone] or if no answer [phone]

## 2012-06-29 NOTE — Progress Notes (Signed)
Patient ID: Wayne [name], male   DOB: 1941-01-02, 71 y.o.   MRN: [id number] S:c/o gas pain last night and this morning O:BP 142/66  Pulse 97  Temp 98 F (36.7 C) (Oral)  Resp 18  Ht 5\' 10"  (1.778 m)  Wt 106.3 kg (234 lb 5.6 oz)  BMI 33.63 kg/m2  SpO2 93%  Intake/Output Summary (Last 24 hours) at 06/29/12 1139 Last data filed at 06/29/12 4098  Gross per 24 hour  Intake   1230 ml  Output   1878 ml  Net   -648 ml   Intake/Output: I/O last 3 completed shifts: In: 1750 [P.O.:1320; I.V.:10; NG/GT:420] Out: 2978 [Urine:2975; Stool:3]  Intake/Output this shift:  Total I/O In: -  Out: 500 [Urine:500] Weight change: -3.2 kg (-7 lb 0.9 oz) Gen:WD WN WM in NAD CVS:no rub Resp:CTA JXB:JYNWGN Ext:2+edema, diffuse rash   Lab 06/29/12 0419 06/28/12 0410 06/27/12 0500 06/26/12 0410 06/25/12 0415 06/24/12 0515 06/23/12 0500  NA 137 136 135 138 135 139 136  K 3.0* 3.3* 3.3* 4.3 4.3 4.8 3.9  CL 95* 93* 92* 98 95* 99 99  CO2 29 29 29 27 23 19 21   GLUCOSE 83 143* 159* 214* 336* 129* 93  BUN 98* 98* 91* 80* 76* 67* 62*  CREATININE 4.74* 5.11* 5.53* 5.42* 5.65* 5.36* 5.27*  ALBUMIN -- 1.8* 1.8* 1.6* 1.6* 1.6* 1.4*  CALCIUM 8.5 8.5 8.5 8.5 8.2* 8.5 8.2*  PHOS -- 5.9* 5.9* 6.9* 7.8* 8.8* 7.0*  AST -- 27 -- 14 -- -- --  ALT -- <5 -- <5 -- -- --   Liver Function Tests:  Lab 06/28/12 0410 06/27/12 0500 06/26/12 0410  AST 27 -- 14  ALT <5 -- <5  ALKPHOS 65 -- 67  BILITOT 0.2* -- 0.2*  PROT 5.2* -- 5.0*  ALBUMIN 1.8* 1.8* 1.6*   No results found for this basename: LIPASE:3,AMYLASE:3 in the last 168 hours No results found for this basename: AMMONIA:3 in the last 168 hours CBC:  Lab 06/29/12 0419 06/28/12 0410 06/27/12 0500 06/26/12 0410 06/25/12 0415  WBC 11.6* 8.8 6.7 -- --  NEUTROABS -- -- -- -- 7.2  HGB 9.0* 8.6* 8.6* -- --  HCT 27.5* 26.2* 26.3* -- --  MCV 84.6 83.4 83.2 83.4 83.7  PLT 317 331 289 -- --   Cardiac Enzymes: No results found for this basename:  CKTOTAL:5,CKMB:5,CKMBINDEX:5,TROPONINI:5 in the last 168 hours CBG:  Lab 06/29/12 0803 06/29/12 0343 06/28/12 2320 06/28/12 2000 06/28/12 1705  GLUCAP 97 85 168* 146* 155*    Iron Studies: No results found for this basename: IRON,TIBC,TRANSFERRIN,FERRITIN in the last 72 hours Studies/Results: No results found.    Marland Kitchen allopurinol  200 mg Oral Daily  . calcium acetate  1,334 mg Oral TID WC  . carvedilol  3.125 mg Oral BID WC  . collagenase   Topical Daily  . darbepoetin (ARANESP) injection - NON-DIALYSIS  150 mcg Subcutaneous Q14 Days  . feeding supplement  237 mL Oral TID BM  . feeding supplement (NEPRO CARB STEADY)  1,000 mL Per Tube QHS  . feeding supplement  30 mL Per Tube BID  . heparin subcutaneous  5,000 Units Subcutaneous Q8H  . hydrocortisone cream   Topical BID  . insulin aspart  0-20 Units Subcutaneous Q4H  . insulin aspart  3 Units Subcutaneous TID WC  . insulin glargine  20 Units Subcutaneous QHS  . metoCLOPramide  5 mg Per Tube TID AC & HS  . polyethylene glycol  17  g Oral Once  . potassium chloride  20 mEq Oral Daily  . potassium chloride  20 mEq Oral Once  . predniSONE  40 mg Oral Q breakfast  . ranitidine  150 mg Per Tube QHS  . silver sulfADIAZINE   Topical Daily  . sodium chloride      . vancomycin  1,500 mg Intravenous Q48H  . DISCONTD: hydrocortisone sodium succinate  100 mg Intravenous Q6H    BMET    Component Value Date/Time   NA 137 06/29/2012 0419   K 3.0* 06/29/2012 0419   CL 95* 06/29/2012 0419   CO2 29 06/29/2012 0419   GLUCOSE 83 06/29/2012 0419   BUN 98* 06/29/2012 0419   CREATININE 4.74* 06/29/2012 0419   CREATININE 0.78 06/30/2011 1702   CALCIUM 8.5 06/29/2012 0419   GFRNONAA 11* 06/29/2012 0419   GFRAA 13* 06/29/2012 0419   CBC    Component Value Date/Time   WBC 11.6* 06/29/2012 0419   RBC 3.25* 06/29/2012 0419   HGB 9.0* 06/29/2012 0419   HCT 27.5* 06/29/2012 0419   PLT 317 06/29/2012 0419   MCV 84.6 06/29/2012 0419   MCH 27.7 06/29/2012 0419     MCHC 32.7 06/29/2012 0419   RDW 17.6* 06/29/2012 0419   LYMPHSABS 0.9 06/25/2012 0415   MONOABS 0.6 06/25/2012 0415   EOSABS 0.1 06/25/2012 0415   BASOSABS 0.0 06/25/2012 0415   Assessment/Plan:  1. AKI-nonoliguric. Finally starting to see creatinine levels decline for a second consecutive day. Increased BUN likely related to diuresis 2. MSSA bacteremia/sepsis- improving with Abx. Appreciate ID input 3. Leukocytoclastic vasculitis/rash- per pt improving 4. Anemia- chronic illness/hospitalization 5. DM- per primary svc 6. Malnutrition- on TF's 7. Deconditioning- cont with PT/OT 8. Hypokalemia- replete orally 9. abd pain- and gas pain.  Will start PPI if ok with primary svc  Thena Devora A

## 2012-06-29 NOTE — Progress Notes (Signed)
Hydrotherapy:   06/29/12 0950  Subjective Assessment  Subjective "I am ready."  Patient and Family Stated Goals Healed up and able to walk  Evaluation and Treatment  Evaluation and Treatment Procedures Explained to Patient/Family Yes  Evaluation and Treatment Procedures agreed to  Incision 06/15/12 Leg Left  Date First Assessed/Time First Assessed: 06/15/12 1328   Location: Leg  Location Orientation: Left  Site / Wound Assessment Bleeding;Black;Pink;Red  Margins Unattacted edges (unapproximated)  Closure None  Drainage Amount Moderate  Drainage Description Serosanguineous;Sanguineous;Odor  Dressing Type ABD;Moist to dry;Other (Comment) (Silvadene, Santyl, NS gauze (kerlix), ABD, dry kerlix, ACE.)  Dressing Changed  Wound Therapy - Assess/Plan/Recommendations  Wound Therapy - Clinical Statement Pt admitted with large left calf wound that continues to progress.  Able to debride more black necrotic tissue today along with clotting.  Will continue to benefit from further hydrotherapy in order to facilitate wound healing.  Will follow along with MD in regards to application of VAC.  Hydrotherapy Plan Debridement;Dressing change;Patient/family education;Pulsatile lavage with suction (PLS at 4-12 psi x 2000 mL NS.  Scapel/forcep debridement.)  Wound Therapy - Frequency 6X / week  Wound Therapy - Follow Up Recommendations Skilled nursing facility  Wound Therapy Goals - Improve the function of patient's integumentary system by progressing the wound(s) through the phases of wound healing by:  Decrease Necrotic Tissue - Progress Progressing toward goal  Increase Granulation Tissue - Progress Progressing toward goal  Improve Drainage Characteristics - Progress Progressing toward goal    Pain:  8/10 in left LE.  Pt premedicated and repositioned.  06/29/2012 Cephus Shelling, PT, DPT [phone]

## 2012-06-30 ENCOUNTER — Encounter (HOSPITAL_COMMUNITY): Payer: Self-pay | Admitting: Anesthesiology

## 2012-06-30 ENCOUNTER — Encounter (HOSPITAL_COMMUNITY)
Admission: EM | Disposition: A | Discharge status: Nursing Home (Includes ICF) | Payer: Self-pay | Source: Ambulatory Visit | Attending: Internal Medicine

## 2012-06-30 ENCOUNTER — Inpatient Hospital Stay (HOSPITAL_COMMUNITY): Payer: Medicare Other | Admitting: Anesthesiology

## 2012-06-30 HISTORY — PX: I & D EXTREMITY: SHX5045

## 2012-06-30 LAB — BASIC METABOLIC PANEL
BUN: 96 mg/dL — ABNORMAL HIGH (ref 6–23)
CO2: 28 mEq/L (ref 19–32)
Chloride: 96 mEq/L (ref 96–112)
Creatinine, Ser: 4.21 mg/dL — ABNORMAL HIGH (ref 0.50–1.35)
GFR calc Af Amer: 15 mL/min — ABNORMAL LOW (ref >90)

## 2012-06-30 LAB — GLUCOSE, CAPILLARY
Glucose-Capillary: 131 mg/dL — ABNORMAL HIGH (ref 70–99)
Glucose-Capillary: 135 mg/dL — ABNORMAL HIGH (ref 70–99)
Glucose-Capillary: 241 mg/dL — ABNORMAL HIGH (ref 70–99)

## 2012-06-30 LAB — APTT: aPTT: 32 seconds (ref 24–37)

## 2012-06-30 LAB — PROTIME-INR: Prothrombin Time: 14.7 seconds (ref 11.6–15.2)

## 2012-06-30 LAB — CARBOXYHEMOGLOBIN: Methemoglobin: 0.9 % (ref 0.0–1.5)

## 2012-06-30 SURGERY — IRRIGATION AND DEBRIDEMENT EXTREMITY
Anesthesia: General | Site: Leg Lower | Laterality: Left | Wound class: Contaminated

## 2012-06-30 MED ORDER — ONDANSETRON HCL 4 MG PO TABS: 4.0000 mg | ORAL_TABLET | Freq: Four times a day (QID) | ORAL | Status: DC | PRN

## 2012-06-30 MED ORDER — HYDROMORPHONE HCL PF 1 MG/ML IJ SOLN
0.5000 mg | INTRAMUSCULAR | Status: DC | PRN
  Administered 2012-06-30 – 2012-07-01 (×6): 0.5 mg via INTRAVENOUS
  Filled 2012-06-30 (×5): qty 1

## 2012-06-30 MED ORDER — OXYCODONE-ACETAMINOPHEN 5-325 MG PO TABS
1.0000 | ORAL_TABLET | ORAL | Status: DC | PRN
  Administered 2012-07-01 – 2012-07-02 (×5): 2 via ORAL
  Filled 2012-06-30 (×5): qty 2

## 2012-06-30 MED ORDER — LORAZEPAM 2 MG/ML IJ SOLN
0.5000 mg | Freq: Once | INTRAMUSCULAR | Status: AC
  Administered 2012-06-30: 0.5 mg via INTRAVENOUS

## 2012-06-30 MED ORDER — GENTAMICIN SULFATE 40 MG/ML IJ SOLN
INTRAMUSCULAR | Status: DC | PRN
  Administered 2012-06-30: 240 mg

## 2012-06-30 MED ORDER — SODIUM CHLORIDE 0.9 % IR SOLN
Status: DC | PRN
  Administered 2012-06-30: 7000 mL

## 2012-06-30 MED ORDER — ONDANSETRON HCL 4 MG/2ML IJ SOLN: 4.0000 mg | Freq: Four times a day (QID) | INTRAMUSCULAR | Status: DC | PRN

## 2012-06-30 MED ORDER — HYDROMORPHONE HCL PF 1 MG/ML IJ SOLN
0.2500 mg | INTRAMUSCULAR | Status: DC | PRN
  Administered 2012-06-30 (×3): 0.5 mg via INTRAVENOUS

## 2012-06-30 MED ORDER — HYDROMORPHONE HCL PF 1 MG/ML IJ SOLN
INTRAMUSCULAR | Status: AC
  Filled 2012-06-30: qty 1

## 2012-06-30 MED ORDER — MUPIROCIN CALCIUM 2 % EX CREA
TOPICAL_CREAM | CUTANEOUS | Status: AC
  Filled 2012-06-30: qty 15

## 2012-06-30 MED ORDER — METOCLOPRAMIDE HCL 5 MG/ML IJ SOLN
5.0000 mg | Freq: Three times a day (TID) | INTRAMUSCULAR | Status: DC | PRN
  Filled 2012-06-30: qty 2

## 2012-06-30 MED ORDER — DEXTROSE 5 % IV SOLN
INTRAVENOUS | Status: AC
  Filled 2012-06-30: qty 50

## 2012-06-30 MED ORDER — LORAZEPAM 2 MG/ML IJ SOLN
INTRAMUSCULAR | Status: AC
  Administered 2012-06-30: 0.5 mg via INTRAVENOUS
  Filled 2012-06-30: qty 1

## 2012-06-30 MED ORDER — FENTANYL CITRATE 0.05 MG/ML IJ SOLN
INTRAMUSCULAR | Status: DC | PRN
  Administered 2012-06-30 (×2): 25 ug via INTRAVENOUS
  Administered 2012-06-30: 100 ug via INTRAVENOUS

## 2012-06-30 MED ORDER — VANCOMYCIN HCL IN DEXTROSE 1-5 GM/200ML-% IV SOLN: 1000.0000 mg | Freq: Two times a day (BID) | INTRAVENOUS | Status: DC

## 2012-06-30 MED ORDER — SODIUM CHLORIDE 0.9 % IV SOLN
INTRAVENOUS | Status: DC | PRN
  Administered 2012-06-30: 12:00:00 via INTRAVENOUS

## 2012-06-30 MED ORDER — METOCLOPRAMIDE HCL 5 MG PO TABS
5.0000 mg | ORAL_TABLET | Freq: Three times a day (TID) | ORAL | Status: DC | PRN
  Filled 2012-06-30: qty 2

## 2012-06-30 MED ORDER — MIDAZOLAM HCL 5 MG/5ML IJ SOLN
INTRAMUSCULAR | Status: DC | PRN
  Administered 2012-06-30: 1 mg via INTRAVENOUS

## 2012-06-30 MED ORDER — GENTAMICIN SULFATE 40 MG/ML IJ SOLN
INTRAMUSCULAR | Status: AC
  Filled 2012-06-30: qty 6

## 2012-06-30 MED ORDER — FENTANYL CITRATE 0.05 MG/ML IJ SOLN
50.0000 ug | Freq: Once | INTRAMUSCULAR | Status: AC
  Administered 2012-06-30: 50 ug via INTRAVENOUS

## 2012-06-30 MED ORDER — LIDOCAINE HCL (CARDIAC) 20 MG/ML IV SOLN
INTRAVENOUS | Status: DC | PRN
  Administered 2012-06-30: 50 mg via INTRAVENOUS

## 2012-06-30 MED ORDER — VANCOMYCIN HCL 1000 MG IV SOLR
INTRAVENOUS | Status: DC | PRN
  Administered 2012-06-30: 1000 mg

## 2012-06-30 MED ORDER — VANCOMYCIN HCL IN DEXTROSE 1-5 GM/200ML-% IV SOLN
1000.0000 mg | Freq: Two times a day (BID) | INTRAVENOUS | Status: DC
  Filled 2012-06-30: qty 200

## 2012-06-30 MED ORDER — CLINDAMYCIN PHOSPHATE 600 MG/50ML IV SOLN
INTRAVENOUS | Status: DC | PRN
  Administered 2012-06-30: 600 mg via INTRAVENOUS

## 2012-06-30 MED ORDER — CLINDAMYCIN PHOSPHATE 600 MG/4ML IJ SOLN
INTRAMUSCULAR | Status: AC
  Filled 2012-06-30: qty 4

## 2012-06-30 MED ORDER — ETOMIDATE 2 MG/ML IV SOLN
INTRAVENOUS | Status: DC | PRN
  Administered 2012-06-30: 16 mg via INTRAVENOUS

## 2012-06-30 MED ORDER — VANCOMYCIN HCL 1000 MG IV SOLR
1000.0000 mg | INTRAVENOUS | Status: AC
  Filled 2012-06-30: qty 1000

## 2012-06-30 MED ORDER — PANTOPRAZOLE SODIUM 40 MG PO TBEC
40.0000 mg | DELAYED_RELEASE_TABLET | Freq: Every day | ORAL | Status: DC
  Administered 2012-06-30 – 2012-07-02 (×3): 40 mg via ORAL
  Filled 2012-06-30 (×3): qty 1

## 2012-06-30 SURGICAL SUPPLY — 52 items
BLADE SURG 10 STRL SS (BLADE) ×2 IMPLANT
BNDG COHESIVE 4X5 TAN STRL (GAUZE/BANDAGES/DRESSINGS) ×2 IMPLANT
BNDG COHESIVE 6X5 TAN STRL LF (GAUZE/BANDAGES/DRESSINGS) ×4 IMPLANT
BNDG GAUZE STRTCH 6 (GAUZE/BANDAGES/DRESSINGS) ×6 IMPLANT
CANISTER WOUND CARE 500ML ATS (WOUND CARE) ×1 IMPLANT
CLOTH BEACON ORANGE TIMEOUT ST (SAFETY) ×2 IMPLANT
COTTON STERILE ROLL (GAUZE/BANDAGES/DRESSINGS) ×2 IMPLANT
COVER SURGICAL LIGHT HANDLE (MISCELLANEOUS) ×2 IMPLANT
CUFF TOURNIQUET SINGLE 18IN (TOURNIQUET CUFF) ×2 IMPLANT
CUFF TOURNIQUET SINGLE 24IN (TOURNIQUET CUFF) IMPLANT
CUFF TOURNIQUET SINGLE 34IN LL (TOURNIQUET CUFF) IMPLANT
CUFF TOURNIQUET SINGLE 44IN (TOURNIQUET CUFF) IMPLANT
DRAPE INCISE IOBAN 66X45 STRL (DRAPES) ×1 IMPLANT
DRAPE U-SHAPE 47X51 STRL (DRAPES) ×2 IMPLANT
DRSG ADAPTIC 3X8 NADH LF (GAUZE/BANDAGES/DRESSINGS) ×2 IMPLANT
DRSG MEPITEL 4X7.2 (GAUZE/BANDAGES/DRESSINGS) ×3 IMPLANT
DRSG VAC ATS LRG SENSATRAC (GAUZE/BANDAGES/DRESSINGS) ×1 IMPLANT
DURAPREP 26ML APPLICATOR (WOUND CARE) ×2 IMPLANT
ELECT CAUTERY BLADE 6.4 (BLADE) IMPLANT
ELECT REM PT RETURN 9FT ADLT (ELECTROSURGICAL)
ELECTRODE REM PT RTRN 9FT ADLT (ELECTROSURGICAL) IMPLANT
GLOVE BIOGEL PI IND STRL 9 (GLOVE) ×1 IMPLANT
GLOVE BIOGEL PI INDICATOR 9 (GLOVE) ×1
GLOVE SURG ORTHO 9.0 STRL STRW (GLOVE) ×3 IMPLANT
GOWN PREVENTION PLUS XLARGE (GOWN DISPOSABLE) ×2 IMPLANT
GOWN SRG XL XLNG 56XLVL 4 (GOWN DISPOSABLE) ×1 IMPLANT
GOWN STRL NON-REIN XL XLG LVL4 (GOWN DISPOSABLE) ×2
HANDPIECE INTERPULSE COAX TIP (DISPOSABLE)
KIT BASIN OR (CUSTOM PROCEDURE TRAY) ×2 IMPLANT
KIT ROOM TURNOVER OR (KITS) ×2 IMPLANT
KIT STIMULAN RAPID CURE  10CC (Orthopedic Implant) ×1 IMPLANT
KIT STIMULAN RAPID CURE 10CC (Orthopedic Implant) IMPLANT
MANIFOLD NEPTUNE II (INSTRUMENTS) ×2 IMPLANT
MATRIX SURGICAL PSMX 10X15CM (Tissue) ×1 IMPLANT
MICROMATRIX 500MG (Tissue) ×2 IMPLANT
NS IRRIG 1000ML POUR BTL (IV SOLUTION) ×2 IMPLANT
PACK ORTHO EXTREMITY (CUSTOM PROCEDURE TRAY) ×2 IMPLANT
PAD ARMBOARD 7.5X6 YLW CONV (MISCELLANEOUS) ×4 IMPLANT
PADDING CAST COTTON 6X4 STRL (CAST SUPPLIES) ×2 IMPLANT
SET HNDPC FAN SPRY TIP SCT (DISPOSABLE) IMPLANT
SOLUTION PARTIC MCRMTRX 500MG (Tissue) IMPLANT
SPONGE GAUZE 4X4 12PLY (GAUZE/BANDAGES/DRESSINGS) ×2 IMPLANT
SPONGE LAP 18X18 X RAY DECT (DISPOSABLE) ×2 IMPLANT
STOCKINETTE IMPERVIOUS 9X36 MD (GAUZE/BANDAGES/DRESSINGS) ×2 IMPLANT
SUT ETHILON 2 0 PSLX (SUTURE) ×4 IMPLANT
TOWEL OR 17X24 6PK STRL BLUE (TOWEL DISPOSABLE) ×2 IMPLANT
TOWEL OR 17X26 10 PK STRL BLUE (TOWEL DISPOSABLE) ×3 IMPLANT
TUBE ANAEROBIC SPECIMEN COL (MISCELLANEOUS) IMPLANT
TUBE CONNECTING 12X1/4 (SUCTIONS) ×2 IMPLANT
UNDERPAD 30X30 INCONTINENT (UNDERPADS AND DIAPERS) ×2 IMPLANT
WATER STERILE IRR 1000ML POUR (IV SOLUTION) ×2 IMPLANT
YANKAUER SUCT BULB TIP NO VENT (SUCTIONS) ×2 IMPLANT

## 2012-06-30 NOTE — Anesthesia Preprocedure Evaluation (Addendum)
Anesthesia Evaluation  Patient identified by MRN, date of birth, ID band Patient awake    Reviewed: Allergy & Precautions, H&P , NPO status , Patient's Chart, lab work & pertinent test results  Airway Mallampati: II TM Distance: >3 FB Neck ROM: Full    Dental  (+) Edentulous Upper and Edentulous Lower   Pulmonary shortness of breath and with exertion, sleep apnea and Continuous Positive Airway Pressure Ventilation , pneumonia -, resolved,  Septic this admission breath sounds clear to auscultation- rhonchi  Pulmonary exam normal + decreased breath sounds      Cardiovascular Exercise Tolerance: Poor hypertension, Pt. on medications + CAD, + Past MI, + Peripheral Vascular Disease and +CHF Rhythm:Regular Rate:Normal  Non ST MI this admission Ischemic Cardiomyopathy   Neuro/Psych Anxiety    GI/Hepatic GERD-  Medicated and Controlled,  Endo/Other  Poorly Controlled, Type 2, Oral Hypoglycemic Agents  Renal/GU ARF this admission   Hx/o Prostate CA    Musculoskeletal  (+) Arthritis -,   Abdominal (+) + obese,   Peds  Hematology   Anesthesia Other Findings   Reproductive/Obstetrics                        Anesthesia Physical Anesthesia Plan  ASA: IV  Anesthesia Plan: General   Post-op Pain Management:    Induction: Intravenous  Airway Management Planned: LMA  Additional Equipment:   Intra-op Plan:   Post-operative Plan: Extubation in OR  Informed Consent: I have reviewed the patients History and Physical, chart, labs and discussed the procedure including the risks, benefits and alternatives for the proposed anesthesia with the patient or authorized representative who has indicated his/her understanding and acceptance.     Plan Discussed with: CRNA, Anesthesiologist and Surgeon  Anesthesia Plan Comments:        Anesthesia Quick Evaluation

## 2012-06-30 NOTE — Op Note (Signed)
OPERATIVE REPORT  DATE OF SURGERY: 06/30/2012  PATIENT:  Wayne [name],  71 y.o. male  PRE-OPERATIVE DIAGNOSIS:  Left leg wound dehiscence 20 cm x 10 cm  POST-OPERATIVE DIAGNOSIS:  Left leg wound dehiscence 20 cm x 10 cm  PROCEDURE:  Procedure(s): IRRIGATION AND DEBRIDEMENT EXTREMITY excisional debridement skin soft tissue muscle fascia. Application of stimulant antibiotic beads with 1 g vancomycin and 240 mg gentamicin. Application of a cell xenograft tissue graft for wound 10 x 10 cm. Application of wound VAC  SURGEON:  Surgeon(s): Nadara Mustard, MD  ANESTHESIA:   general  EBL:  300 cc ML  SPECIMEN:  No Specimen  TOURNIQUET:  * No tourniquets in log *  PROCEDURE DETAILS: Patient is a 71 year old gentleman who is septic and abscess left leg. He initially  underwent irrigation and debridement and postoperatively patient was ICU and was sepsis and ventilator support and was unable to be return to the operating room until this time. Patient presents with a dehiscence necrotic wound and  presents for the above-mentioned procedure. Risks and benefits of surgery were discussed with the patient and his wife including infection neurovascular injury nonhealing of the wound need for additional surgery. Patient and his wife state understand and wish to proceed at this time. Description of procedure patient was brought to or room 6 and underwent a general anesthetic. After adequate levels of anesthesia were obtained patient's left lower extremity was prepped using Betadine paint and draped into a sterile field. The wound was irrigated with pulsatile lavage using a 10 blade knife and Ronjair and Cobb elevator the skin soft tissue and muscle and back to bleeding viable healthy muscle. The wound is irrigated liters of pulsatile lavage. Antibiotic beads with 1 g of vancomycin and 240 mg of gentamicin were placed deep within the wound. The wound was then covered with a cell xenograft tissue graft and the  wound was partially closed with 2-0 nylon. The wound was covered with a wound VAC set to -125 mm of suction. Patient was extubated taken to the PACU in stable condition. PLAN OF CARE: Admit to inpatient   PATIENT DISPOSITION:  PACU - hemodynamically stable.   Nadara Mustard, MD 06/30/2012 1:11 PM

## 2012-06-30 NOTE — Transfer of Care (Signed)
Immediate Anesthesia Transfer of Care Note  Patient: Wayne [name]  Procedure(s) Performed: Procedure(s) (LRB): IRRIGATION AND DEBRIDEMENT EXTREMITY (Left)  Patient Location: PACU  Anesthesia Type: General  Level of Consciousness: sedated  Airway & Oxygen Therapy: Patient Spontanous Breathing and Patient connected to face mask oxygen  Post-op Assessment: Report given to PACU RN and Post -op Vital signs reviewed and stable  Post vital signs: Reviewed and stable  Complications: No apparent anesthesia complications

## 2012-06-30 NOTE — Progress Notes (Signed)
Name: Wayne [name] MRN: [id number] DOB: [birth year]    LOS: 16 Date of admit 06/14/2012 12:48 PM PCP is Kristian Covey, MD Ortho is Dr Jonathon Bellows  Referring Provider:  Dr Preston Fleeting - ER Reason for Referral:  Sepsis, hypotension, acidosis , hyperglycemia    PULMONARY / CRITICAL CARE MEDICINE  71 yr old septic shock, resp failure, leg source. S/p irrigation and debridement of LE on 7/2.  Events Since Admission: 7/2- refractory shock post op, acidosis, arf, epi required 7/2 irrigation and debridement of LLE 7/2 2 units PRBC 7/3- Improved pressors 7/5- Off all pressors 7/6 2 units PRBC 7/8>>> SDU 7/13> No overnight issues 7/14> stable, seen by [place] Ouch, Deterding, Terri Piedra... 7/17> back to OR for LLE debridement  Overnight: Taking PO but swallowing still hurts due to Panda LLE pain last night  Current Status: Saw pt in the Pre-op area getting ready for LLE procedure Rash getting better, starting to dry up,  Odynophagia as described above  Lines/Tubes: ETT 7/1>>7/5 NG Tube 7/1 >>7/5 Foley 7/1>>> RIJ 7/1>>>dc  R Radial 7/1>>>7/4 RPIV 7/1>>>7/4 LPIV 7/1>>> Premrose Drains7/2>>>7/5 L IJ HD catheter 7/4>>  ABX: Vanc 7/1 >>> 7/2 Zosyn 7/1 >>> 7/2 clinda7/2>>>7/3 Linezolid 7/2>>>7/5 Imipenim 7/2>>>7/5 Cefazolin ( MSSA cellulitis/abscess LLE per ID) 7/5 >>>HELD 7/10 d/t rash>>7/10 7/10 Vanco (MSSA, allergic to ceph, per ID)>>  Vital Signs: Temp:  [97.6 F (36.4 C)-98.4 F (36.9 C)] 98.4 F (36.9 C) (07/17 1019) Pulse Rate:  [85-92] 85  (07/17 1009) Resp:  [14-23] 15  (07/17 1009) BP: (107-140)/(62-73) 140/71 mmHg (07/17 1009) SpO2:  [91 %-95 %] 91 % (07/17 1009) Weight:  [107.1 kg (236 lb 1.8 oz)] 107.1 kg (236 lb 1.8 oz) (07/17 0433)  Physical Examination: General: NAD, interacting, calm HEENT: NCAT, EOMi, HD cath in place PULM: CTA B CV: RRR, no mgr AB: BS hypoactive , soft, nontender   Ext:  dry dressings, tender LLE to palp Neuro: A&Ox4 Skin:  diffuse vesicular hemorrhagic/ erythematous rash > vesicles starting to dry some   Principal Problem:  *Staphylococcus aureus bacteremia with sepsis Active Problems:  Secondary DM with peripheral vascular disease, uncontrolled  HYPERTROPHY PROSTATE W/UR OBST & OTH LUTS  CORONARY ATHEROSCLEROSIS NATIVE CORONARY ARTERY  Polymyalgia rheumatica  Systolic CHF, chronic  OSA (obstructive sleep apnea)  Obesity  Cellulitis and abscess of lower leg  Healthcare-associated pneumonia  Hyperglycemia  Acute encephalopathy  NSTEMI, initial episode of care  AKI (acute kidney injury)  Protein-calorie malnutrition, severe  Leucocytoclastic vasculitis   ASSESSMENT AND PLAN  PULMONARY  Lab 06/30/12 0529 06/29/12 0424 06/28/12 0424 06/27/12 0636 06/26/12 0350  PHART -- -- -- -- --  PCO2ART -- -- -- -- --  PO2ART -- -- -- -- --  HCO3 -- -- -- -- --  O2SAT 91.1 89.5 73.3 57.3 85.5   CXR:  7/11:  No edema, lines ok ETT: 7/1>>>7/5  A:   Acute Resp Distress due to metabolic acidosis +/- LLL PNA,  ARDS.  Resolved Doing well since extubation 7/5. No supplemental O2 requirement. Pt w/ h/o OSA.   P:   -not requiring CPAP & staff notes resting well, no snoring, not restless, etc.    CARDIOVASCULAR No results found for this basename: TROPONINI:5,LATICACIDVEN:5, O2SATVEN:5,PROBNP:5 in the last 168 hours  Lab 06/29/12 1145 06/29/12 0419 06/28/12 0410  WBC 12.0* 11.6* 8.8  HGB 8.9* 9.0* 8.6*  HCT 27.2* 27.5* 26.2*  PLT 299 317 331   ECG:  none Lines:  RIJ 7/1>>>7/4 L IJ HD  7/4>> LPIV 7/1>>>  A:  Severe sepsis/septic shock d/t staph leg abscess and bacteremia (MSSA)-  has resolved.-  Pt EF of 45% 2009. Echo  showing EF 15%, diffuse hypo. 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   P:  -Per cardiology team and appreciate input -repeat TTE when cards deems appropriate - hopefully he will regain some LV fxn if this was sepsis-induced CM -SQ heparin -coreg restarted 7/15 at low  dose -note plans to restart ASA when OK from surgical standpoint   RENAL  Lab 06/30/12 0500 06/29/12 1145 06/29/12 0419 06/28/12 0410 06/27/12 0500 06/26/12 0410 06/25/12 0415 06/24/12 0515  NA 137 136 137 136 135 -- -- --  K 3.2* 3.9 -- -- -- -- -- --  CL 96 96 95* 93* 92* -- -- --  CO2 28 29 29 29 29  -- -- --  BUN 96* 97* 98* 98* 91* -- -- --  CREATININE 4.21* 4.67* 4.74* 5.11* 5.53* -- -- --  CALCIUM 8.2* 8.5 8.5 8.5 8.5 -- -- --  MG -- -- -- -- -- -- -- --  PHOS -- -- -- 5.9* 5.9* 6.9* 7.8* 8.8*   Intake/Output      07/16 0701 - 07/17 0700 07/17 0701 - 07/18 0700   P.O. 1030    I.V. (mL/kg) 55 (0.5)    NG/GT     Total Intake(mL/kg) 1085 (10.1)    Urine (mL/kg/hr) 2325 (0.9) 525 (1.1)   Stool 1    Total Output 2326 525   Net -1241 -525         Foley: 7/1>>>  A:   Sepsis induced ATN/ARF slowly better, now in post ATN diuresis phase. S Cr finally starting to improve > suspect he will recover, ? To new baseline  P:   -follow auto-diuresis as he recovers from ATN -anticipate no more HD, renal to sign off today -will d/c HD catheter 7/18 if S Cr trends down another day  GASTROINTESTINAL  Lab 06/28/12 0410 06/27/12 0500 06/26/12 0410 06/25/12 0415 06/24/12 0515  AST 27 -- 14 -- --  ALT <5 -- <5 -- --  ALKPHOS 65 -- 67 -- --  BILITOT 0.2* -- 0.2* -- --  PROT 5.2* -- 5.0* -- --  ALBUMIN 1.8* 1.8* 1.6* 1.6* 1.6*   KUB 7/11: mild ileus d/t renal issues, cardiac output  A:   MOD  ileus. Abdominal bloating , not taking POs, severe protein calorie malnutrition, wound not healing, ?element gastroparesis with DM  P:   -Transpyloric FT in place -has had issues with adequate PO intake.  7/15 plan for calorie count during day and hold TF at night.  Patient has little appetite during day. Will wait for calorie count and LLE procedure to be completed before d/c Panda, but believe he will take better PO without it >> likely d/c 7/18 -changed reglan to PO -H2  blocker   HEMATOLOGIC  Lab 06/30/12 0500 06/29/12 1145 06/29/12 0419 06/28/12 0410 06/27/12 0500 06/26/12 0410  HGB -- 8.9* 9.0* 8.6* 8.6* 7.9*  HCT -- 27.2* 27.5* 26.2* 26.3* 24.1*  PLT -- 299 317 331 289 238  INR 1.13 -- -- -- -- --  APTT 32 -- -- -- -- --   A:   Hgb trend +/- stable  P:  -no tfrx unless Hgb < 7.0 -7/13> Hg=7.9, 7/14> Hg=8.6, following    INFECTIOUS  Lab 06/29/12 1145 06/29/12 0419 06/28/12 0410 06/27/12 0500 06/26/12 0410  WBC 12.0* 11.6* 8.8 6.7 6.9  PROCALCITON -- -- -- -- --   Cultures: 7/1 BCX: >>> SA pansens 7/1 BCX:>>> SA 7/1 UCX >>>  No growth 7/2 Wound CX: >>> MSSA  ABX:  Per ID SVC Vanc 7/1 >>> 7/2 Zosyn 7/1 >>> 7/2 7/2 clinda>>> 7/3 Linezolid 7/2>>>7/5 Imipenim 7/2>>>7/5 Ancef 7/5>>>HELD for rash >>d/c 7/10 Vanco 7/10 (MSSA, ceph allergic)>>  A:   Bacteremia and Sepsis w/ SA that is pan sensitive.  Cellulitis LLE w/ Abscess, s/p irriagation and debridement.   Rash now on arms/legs ??d/t ceph allergy, possibly viral exanthem?  P:   -abx per ID, 7/17 is day 18 abx -seen by Dr Terri Piedra, Leukocytoclastic vasculitis, bx taken & path confirms, on Solucortef 100mg  Q6h >> changed to pred 40 on 7/15 -plan taper over 7-14 days beginning 7/18   ENDOCRINE  Lab 06/30/12 1107 06/30/12 1012 06/30/12 0800 06/30/12 0432 06/30/12 0007  GLUCAP 140* 135* 131* 176* 128*   A:    Blood sugars elevated d/t TF, Hx of DM2  P:   -Resume Lantus==> increase to 20u w/ Steroids -SSI to resistant scale   NEUROLOGIC  A:   Pain   P:   -Prn fentanyl, add one time PRN additional dose fentanyl for dressing changes -lidoderm patch   BEST PRACTICE / DISPOSITION Level of Care:  SDU Primary Service:  pccm Consultants:  Ortho Dr Lajoyce Corners , ID, [place] Cards  Code Status:  Full  Diet:  PO daytime, QHS TF DVT Px:  Hep SQ GI Px: PO  H2 blocker Skin Integrity:  Intact other than LLE Social / Family:    Levy Pupa, MD, PhD 06/30/2012, 11:28  AM [facility] [phone] or if no answer [phone]

## 2012-06-30 NOTE — Progress Notes (Signed)
Hydrotherapy and PT Cancellation Note  Treatment cancelled today due to pt for surgery today to left LE in order to close wound and possibly place VAC.  Will hold both hydrotherapy and PT treatment for today.  Will follow.  Danay Mckellar M 06/30/2012, 8:01 AM  06/30/2012 Cephus Shelling, PT, DPT [phone]

## 2012-06-30 NOTE — Progress Notes (Signed)
RT Note: Pt placed on Auto CPAP with a nasal mask. Pt tolerating well, RT will continue to monitor.

## 2012-06-30 NOTE — Anesthesia Postprocedure Evaluation (Signed)
  Anesthesia Post-op Note  Patient: Wayne [name]  Procedure(s) Performed: Procedure(s) (LRB): IRRIGATION AND DEBRIDEMENT EXTREMITY (Left)  Patient Location: PACU  Anesthesia Type: General  Level of Consciousness: awake, alert  and oriented  Airway and Oxygen Therapy: Patient Spontanous Breathing  Post-op Pain: none  Post-op Assessment: Post-op Vital signs reviewed, Patient's Cardiovascular Status Stable, Respiratory Function Stable, Patent Airway, No signs of Nausea or vomiting and Pain level controlled  Post-op Vital Signs: Reviewed and stable  Complications: No apparent anesthesia complications

## 2012-06-30 NOTE — Preoperative (Signed)
Beta Blockers   Reason not to administer Beta Blockers:Not Applicable 

## 2012-06-30 NOTE — Progress Notes (Signed)
Calorie Count Note  Intervention:   Discontinue calorie count.  Continue Ensure Complete PO TID to maximize oral intake.  Recommend liberalize diet to regular to help improve oral intake.  Recommend resume nocturnal TF to meet ~75% of estimated nutrition needs--Nepro at 80 ml/h x 12 hours would provide 1728 kcals, 78 grams protein, 698 ml free water daily.  Discontinue Pro-stat.  Patient is currently NPO for I&D of leg wound.  May require VAC placement today.  Previous Diet: Renal 80/90-2-2 with 1200 ml fluid restriction Supplements: Ensure Complete PO TID  Lunch, dinner, a snack, and half of one supplement from yesterday: 736 kcals, 18 grams protein  Total intake for 7/16 is 922 kcals (40% of minimum estimated needs) and 25 grams protein (19% of minimum estimated needs).  TF is still on hold.  PO intake is not sufficient to support wound healing.  Estimated needs: 2300-2500 kcals, 130-150 grams protein daily  Nutrition Dx: Inadequate oral intake, ongoing.  Goal: Intake to meet >90% of estimated nutrition needs to support healing, unmet.    Joaquin Courts, RD, CNSC, LDN Pager# [phone] After Hours Pager# [phone]

## 2012-06-30 NOTE — Progress Notes (Signed)
ANTIBIOTIC CONSULT NOTE-PROGRESS NOTE  Pharmacy Consult for Vancomycin Indication: Staphylococcus aureus bacteremia with sepsis; Allergy to Ancef  Hospital Problems Principal Problem:  *Staphylococcus aureus bacteremia with sepsis Active Problems:  Secondary DM with peripheral vascular disease, uncontrolled  HYPERTROPHY PROSTATE W/UR OBST & OTH LUTS  CORONARY ATHEROSCLEROSIS NATIVE CORONARY ARTERY  Polymyalgia rheumatica  Systolic CHF, chronic  OSA (obstructive sleep apnea)  Obesity  Cellulitis and abscess of lower leg  Healthcare-associated pneumonia  Hyperglycemia  Acute encephalopathy  NSTEMI, initial episode of care  AKI (acute kidney injury)  Protein-calorie malnutrition, severe  Leucocytoclastic vasculitis   Allergies Allergies  Allergen Reactions  . Atorvastatin     REACTION: sore legs  . Ancef (Cefazolin) Rash    Patient Measurements: Height: 5\' 10"  (177.8 cm) Weight: 236 lb 1.8 oz (107.1 kg) IBW/kg (Calculated) : 73   Vital Signs: BP 107/56  Pulse 105  Temp 97.7 F (36.5 C) (Oral)  Resp 7  Ht 5\' 10"  (1.778 m)  Wt 236 lb 1.8 oz (107.1 kg)  BMI 33.88 kg/m2  SpO2 95%   Intake/Output from previous day: 07/16 0701 - 07/17 0700 In: 1085 [P.O.:1030; I.V.:55] Out: 2326 [Urine:2325; Stool:1]  Labs:  [facility] 06/30/12 0500 06/29/12 1145 06/29/12 0419 06/28/12 0410  WBC -- 12.0* 11.6* 8.8  HGB -- 8.9* 9.0* 8.6*  PLT -- 299 317 331  LABCREA -- -- -- --  CREATININE 4.21* 4.67* 4.74* --   Estimated Creatinine Clearance: 20.6 ml/min (by C-G formula based on Cr of 4.21).  Basename 06/30/12 0515 06/29/12 1145  VANCOTROUGH 26.0* 32.4*      Microbiology: Recent Results (from the past 720 hour(s))  CULTURE, BLOOD (ROUTINE X 2)     Status: Normal   Collection Time   06/14/12  1:20 PM      Component Value Range Status Comment   Specimen Description BLOOD ARM LEFT   Final    Special Requests BOTTLES DRAWN AEROBIC ONLY 10CC   Final    Culture  Setup Time  06/15/2012 02:15   Final    Culture     Final    Value: STAPHYLOCOCCUS AUREUS     Note: SUSCEPTIBILITIES PERFORMED ON PREVIOUS CULTURE WITHIN THE LAST 5 DAYS.     Note: Gram Stain Report Called to,Read Back By and Verified With: BURGUNDY Arey 06/15/12 1400 BY SMITHERSJ   Report Status 06/17/2012 FINAL   Final   CULTURE, BLOOD (ROUTINE X 2)     Status: Normal   Collection Time   06/14/12  1:40 PM      Component Value Range Status Comment   Specimen Description BLOOD LEFT HAND   Final    Special Requests BOTTLES DRAWN AEROBIC AND ANAEROBIC 10CC   Final    Culture  Setup Time 06/15/2012 02:15   Final    Culture     Final    Value: STAPHYLOCOCCUS AUREUS     Note: RIFAMPIN AND GENTAMICIN SHOULD NOT BE USED AS SINGLE DRUGS FOR TREATMENT OF STAPH INFECTIONS.     Note: Gram Stain Report Called to,Read Back By and Verified With: BRANDY DIN @ 1814 ON 06/15/12 BY GOLLD   Report Status 06/17/2012 FINAL   Final    Organism ID, Bacteria STAPHYLOCOCCUS AUREUS   Final   URINE CULTURE     Status: Normal   Collection Time   06/14/12  7:15 PM      Component Value Range Status Comment   Specimen Description URINE, RANDOM   Final    Special  Requests NONE   Final    Culture  Setup Time 06/15/2012 01:48   Final    Colony Count NO GROWTH   Final    Culture NO GROWTH   Final    Report Status 06/16/2012 FINAL   Final   MRSA PCR SCREENING     Status: Normal   Collection Time   06/14/12  7:17 PM      Component Value Range Status Comment   MRSA by PCR NEGATIVE  NEGATIVE Final   GRAM STAIN     Status: Normal   Collection Time   06/15/12 12:39 PM      Component Value Range Status Comment   Specimen Description WOUND LEFT LOWER LEG   Final    Special Requests NONE   Final    Gram Stain     Final    Value: ABUNDANT WBC PRESENT,BOTH PMN AND MONONUCLEAR     ABUNDANT GRAM POSITIVE COCCI IN PAIRS IN CLUSTERS   Report Status 06/15/2012 FINAL   Final   ANAEROBIC CULTURE     Status: Normal   Collection Time   06/15/12 12:39  PM      Component Value Range Status Comment   Specimen Description WOUND LEFT LOWER LEG   Final    Special Requests NONE   Final    Gram Stain     Final    Value: ABUNDANT WBC PRESENT,BOTH PMN AND MONONUCLEAR     ABUNDANT GRAM POSITIVE COCCI IN PAIRS     IN CLUSTERS Performed at [facility]   Culture NO ANAEROBES ISOLATED   Final    Report Status 06/20/2012 FINAL   Final   WOUND CULTURE     Status: Normal (Preliminary result)   Collection Time   06/15/12 12:39 PM      Component Value Range Status Comment   Specimen Description WOUND LEFT LOWER LEG   Final    Special Requests NONE   Final    Gram Stain PENDING   Incomplete    Culture     Final    Value: ABUNDANT STAPHYLOCOCCUS AUREUS     Note: RIFAMPIN AND GENTAMICIN SHOULD NOT BE USED AS SINGLE DRUGS FOR TREATMENT OF STAPH INFECTIONS.   Report Status PENDING   Incomplete    Organism ID, Bacteria STAPHYLOCOCCUS AUREUS   Final   CULTURE, BLOOD (ROUTINE X 2)     Status: Normal   Collection Time   06/18/12  5:33 PM      Component Value Range Status Comment   Specimen Description BLOOD LEFT HAND   Final    Special Requests BOTTLES DRAWN AEROBIC ONLY [facility]   Final    Culture  Setup Time 06/19/2012 01:57   Final    Culture NO GROWTH 5 DAYS   Final    Report Status 06/25/2012 FINAL   Final   CULTURE, BLOOD (ROUTINE X 2)     Status: Normal   Collection Time   06/18/12  5:48 PM      Component Value Range Status Comment   Specimen Description BLOOD LEFT ARM   Final    Special Requests BOTTLES DRAWN AEROBIC ONLY 4CC   Final    Culture  Setup Time 06/19/2012 01:57   Final    Culture NO GROWTH 5 DAYS   Final    Report Status 06/25/2012 FINAL   Final   CULTURE, BLOOD (ROUTINE X 2)     Status: Normal   Collection Time   06/19/12  9:09 PM      Component Value Range Status Comment   Specimen Description BLOOD RIGHT HAND   Final    Special Requests BOTTLES DRAWN AEROBIC ONLY 5CC   Final    Culture  Setup Time 06/20/2012 03:01   Final     Culture NO GROWTH 5 DAYS   Final    Report Status 06/26/2012 FINAL   Final   CULTURE, BLOOD (ROUTINE X 2)     Status: Normal   Collection Time   06/19/12  9:09 PM      Component Value Range Status Comment   Specimen Description BLOOD RIGHT ARM   Final    Special Requests BOTTLES DRAWN AEROBIC ONLY 5CC   Final    Culture  Setup Time 06/20/2012 03:01   Final    Culture NO GROWTH 5 DAYS   Final    Report Status 06/26/2012 FINAL   Final     Anti-infectives Anti-infectives     Start     Dose/Rate Route Frequency Ordered Stop   07/01/12 0030   vancomycin (VANCOCIN) IVPB 1000 mg/200 mL premix  Status:  Discontinued        1,000 mg 200 mL/hr over 60 Minutes Intravenous Every 12 hours 06/30/12 1543 06/30/12 1620   06/30/12 1545   vancomycin (VANCOCIN) IVPB 1000 mg/200 mL premix  Status:  Discontinued        1,000 mg 200 mL/hr over 60 Minutes Intravenous Every 12 hours 06/30/12 1543 06/30/12 1618   06/30/12 1232   vancomycin (VANCOCIN) powder  Status:  Discontinued          As needed 06/30/12 1233 06/30/12 1307   06/30/12 1231   gentamicin (GARAMYCIN) injection  Status:  Discontinued          As needed 06/30/12 1232 06/30/12 1307   06/30/12 1200   vancomycin (VANCOCIN) powder 1,000 mg        1,000 mg Other To Surgery 06/30/12 1151 07/01/12 1200   06/25/12 1300   vancomycin (VANCOCIN) 1,500 mg in sodium chloride 0.9 % 500 mL IVPB  Status:  Discontinued        1,500 mg 250 mL/hr over 120 Minutes Intravenous Every 48 hours 06/23/12 1153 06/29/12 1355   06/23/12 1300   vancomycin (VANCOCIN) 2,000 mg in sodium chloride 0.9 % 500 mL IVPB        2,000 mg 250 mL/hr over 120 Minutes Intravenous  Once 06/23/12 1153 06/23/12 1444   06/21/12 2200   ceFAZolin (ANCEF) IVPB 1 g/50 mL premix  Status:  Discontinued        1 g 100 mL/hr over 30 Minutes Intravenous Every 12 hours 06/21/12 0941 06/23/12 1056   06/18/12 1000   ceFAZolin (ANCEF) IVPB 2 g/50 mL premix  Status:  Discontinued        2  g 100 mL/hr over 30 Minutes Intravenous Every 12 hours 06/18/12 0910 06/21/12 0941   06/17/12 1800   imipenem-cilastatin (PRIMAXIN) 500 mg in sodium chloride 0.9 % 100 mL IVPB  Status:  Discontinued        500 mg 200 mL/hr over 30 Minutes Intravenous 4 times per day 06/17/12 1426 06/18/12 0909   06/16/12 0000   imipenem-cilastatin (PRIMAXIN) 250 mg in sodium chloride 0.9 % 100 mL IVPB  Status:  Discontinued        250 mg 200 mL/hr over 30 Minutes Intravenous 4 times per day 06/15/12 1900 06/17/12 1426   06/15/12 2200   linezolid (ZYVOX) IVPB  600 mg  Status:  Discontinued        600 mg 300 mL/hr over 60 Minutes Intravenous Every 12 hours 06/15/12 1712 06/18/12 0909   06/15/12 1900   clindamycin (CLEOCIN) IVPB 600 mg  Status:  Discontinued        600 mg 100 mL/hr over 30 Minutes Intravenous 3 times per day 06/15/12 1126 06/16/12 0820   06/15/12 1800   clindamycin (CLEOCIN) IVPB 600 mg  Status:  Discontinued        600 mg 100 mL/hr over 30 Minutes Intravenous 4 times per day 06/15/12 1125 06/15/12 1126   06/15/12 1800   imipenem-cilastatin (PRIMAXIN) 500 mg in sodium chloride 0.9 % 100 mL IVPB  Status:  Discontinued        500 mg 200 mL/hr over 30 Minutes Intravenous 3 times per day 06/15/12 1728 06/15/12 1900   06/15/12 1230   vancomycin (VANCOCIN) powder 1,000 mg  Status:  Discontinued        1,000 mg Other To Surgery 06/15/12 1215 06/15/12 1712   06/15/12 1222   vancomycin (VANCOCIN) powder  Status:  Discontinued          As needed 06/15/12 1223 06/15/12 1323   06/15/12 1220   gentamicin (GARAMYCIN) injection  Status:  Discontinued          As needed 06/15/12 1222 06/15/12 1323   06/15/12 1130   clindamycin (CLEOCIN) IVPB 900 mg        900 mg 100 mL/hr over 30 Minutes Intravenous STAT 06/15/12 1123 06/15/12 1235   06/15/12 0600   vancomycin (VANCOCIN) IVPB 1000 mg/200 mL premix  Status:  Discontinued        1,000 mg 200 mL/hr over 60 Minutes Intravenous Every 12 hours  06/14/12 1745 06/15/12 1712   06/15/12 0200   piperacillin-tazobactam (ZOSYN) IVPB 3.375 g  Status:  Discontinued        3.375 g 12.5 mL/hr over 240 Minutes Intravenous Every 8 hours 06/14/12 1745 06/15/12 1657   06/14/12 1615   vancomycin (VANCOCIN) IVPB 1000 mg/200 mL premix        1,000 mg 200 mL/hr over 60 Minutes Intravenous  Once 06/14/12 1608 06/14/12 1924   06/14/12 1615  piperacillin-tazobactam (ZOSYN) IVPB 3.375 g       3.375 g 100 mL/hr over 30 Minutes Intravenous  Once 06/14/12 1608 06/14/12 1854   06/14/12 1600   vancomycin (VANCOCIN) IVPB 1000 mg/200 mL premix        1,000 mg 200 mL/hr over 60 Minutes Intravenous  Once 06/14/12 1547 06/14/12 1721   06/14/12 1545   vancomycin (VANCOCIN) injection 1,000 mg  Status:  Discontinued        1,000 mg Intravenous  Once 06/14/12 1532 06/14/12 1547          Assessment:  71 y/o male s/p IRRIGATION AND DEBRIDEMENT EXTREMITY.  Excisional debridement skin soft tissue muscle fascia.of Left Leg Wound.   Intra-operatively, there was the application of stimulant antibiotic beads with 1 g vancomycin and 240 mg gentamicin powder.  Random Vancomycin level ordered just prior to Surgery was 32.4 mcg/ml [7/16]  > > 26 mcg/ml today.  This remains SUPRAtherapeutic.  UOP down today.   Goal of Therapy:   Vancomycin trough level 15-20 mcg/ml  Plan:   Continue to hold Vancomycin doses.    Will change the Vancomycin trough collection time to late afternoon tomorrow.  Will adjust Vancomycin doses as required.  Ziyad Dyar, Elisha Headland, Pharm.D.  06/30/2012 5:33  PM

## 2012-06-30 NOTE — Progress Notes (Signed)
Pharmacy Clarification  Post I&D - Dr Lajoyce Corners ordered warfarin per rx for dvt proplyaxis- unclear indication and pt is already on heparin 5000 units sq q8h for dvt prophylaxis. Spoke w/ Dr Magnus Ivan - ortho, Dr Frederico Hamman - C[facility]; both agreed this was not needed.   Dr Frederico Hamman d/c'd orders for coumadin.   Thanks,  Marchelle Folks. Illene Bolus, PharmD, BCPS Clinical Pharmacist Pager: [phone] 06/30/2012 6:49 PM

## 2012-06-30 NOTE — Progress Notes (Signed)
Patient ID: Wayne [name], male   DOB: 12-31-1940, 71 y.o.   MRN: [id number] S:feels good O:BP 140/71  Pulse 85  Temp 98.4 F (36.9 C) (Oral)  Resp 15  Ht 5\' 10"  (1.778 m)  Wt 107.1 kg (236 lb 1.8 oz)  BMI 33.88 kg/m2  SpO2 91%  Intake/Output Summary (Last 24 hours) at 06/30/12 1036 Last data filed at 06/30/12 1019  Gross per 24 hour  Intake    910 ml  Output   2351 ml  Net  -1441 ml   Intake/Output: I/O last 3 completed shifts: In: 1915 [P.O.:1750; I.V.:65; NG/GT:100] Out: 3052 [Urine:3050; Stool:2]  Intake/Output this shift:  Total I/O In: -  Out: 525 [Urine:525] Weight change: 0.8 kg (1 lb 12.2 oz) Gen:WD WN WM in NAD CVS:RRR Resp:CTA [name] Ext:tr edema   Lab 06/30/12 0500 06/29/12 1145 06/29/12 0419 06/28/12 0410 06/27/12 0500 06/26/12 0410 06/25/12 0415 06/24/12 0515  NA 137 136 137 136 135 138 135 --  K 3.2* 3.9 3.0* 3.3* 3.3* 4.3 4.3 --  CL 96 96 95* 93* 92* 98 95* --  CO2 28 29 29 29 29 27 23  --  GLUCOSE 176* 123* 83 143* 159* 214* 336* --  BUN 96* 97* 98* 98* 91* 80* 76* --  CREATININE 4.21* 4.67* 4.74* 5.11* 5.53* 5.42* 5.65* --  ALBUMIN -- -- -- 1.8* 1.8* 1.6* 1.6* 1.6*  CALCIUM 8.2* 8.5 8.5 8.5 8.5 8.5 8.2* --  PHOS -- -- -- 5.9* 5.9* 6.9* 7.8* 8.8*  AST -- -- -- 27 -- 14 -- --  ALT -- -- -- <5 -- <5 -- --   Liver Function Tests:  Lab 06/28/12 0410 06/27/12 0500 06/26/12 0410  AST 27 -- 14  ALT <5 -- <5  ALKPHOS 65 -- 67  BILITOT 0.2* -- 0.2*  PROT 5.2* -- 5.0*  ALBUMIN 1.8* 1.8* 1.6*   No results found for this basename: LIPASE:3,AMYLASE:3 in the last 168 hours No results found for this basename: AMMONIA:3 in the last 168 hours CBC:  Lab 06/29/12 1145 06/29/12 0419 06/28/12 0410 06/27/12 0500 06/26/12 0410 06/25/12 0415  WBC 12.0* 11.6* 8.8 -- -- --  NEUTROABS -- -- -- -- -- 7.2  HGB 8.9* 9.0* 8.6* -- -- --  HCT 27.2* 27.5* 26.2* -- -- --  MCV 84.7 84.6 83.4 83.2 83.4 --  PLT 299 317 331 -- -- --   Cardiac Enzymes: No results  found for this basename: CKTOTAL:5,CKMB:5,CKMBINDEX:5,TROPONINI:5 in the last 168 hours CBG:  Lab 06/30/12 1012 06/30/12 0800 06/30/12 0432 06/30/12 0007 06/29/12 2021  GLUCAP 135* 131* 176* 128* 252*    Iron Studies: No results found for this basename: IRON,TIBC,TRANSFERRIN,FERRITIN in the last 72 hours Studies/Results: No results found.    Marland Kitchen allopurinol  200 mg Oral Daily  . calcium acetate  1,334 mg Oral TID WC  . carvedilol  3.125 mg Oral BID WC  . collagenase   Topical Daily  . darbepoetin (ARANESP) injection - NON-DIALYSIS  150 mcg Subcutaneous Q14 Days  . feeding supplement  237 mL Oral TID BM  . feeding supplement (NEPRO CARB STEADY)  1,000 mL Per Tube QHS  . feeding supplement  30 mL Per Tube BID  . fentaNYL  50 mcg Intravenous Once  . heparin subcutaneous  5,000 Units Subcutaneous Q8H  . hydrocortisone cream   Topical BID  . insulin aspart  0-20 Units Subcutaneous Q4H  . insulin aspart  3 Units Subcutaneous TID WC  . insulin glargine  20 Units Subcutaneous QHS  . LORazepam  0.5 mg Intravenous Once  . metoCLOPramide  5 mg Per Tube TID AC & HS  . pantoprazole  40 mg Oral Q1200  . polyethylene glycol  17 g Oral Once  . potassium chloride  20 mEq Oral Daily  . predniSONE  40 mg Oral Q breakfast  . ranitidine  150 mg Per Tube QHS  . silver sulfADIAZINE   Topical Daily  . sodium chloride      . sodium chloride      . DISCONTD: insulin aspart  3 Units Subcutaneous TID WC  . DISCONTD: insulin glargine  20 Units Subcutaneous QHS  . DISCONTD: pantoprazole (PROTONIX) IV  40 mg Intravenous QHS  . DISCONTD: vancomycin  1,500 mg Intravenous Q48H    BMET    Component Value Date/Time   NA 137 06/30/2012 0500   K 3.2* 06/30/2012 0500   CL 96 06/30/2012 0500   CO2 28 06/30/2012 0500   GLUCOSE 176* 06/30/2012 0500   BUN 96* 06/30/2012 0500   CREATININE 4.21* 06/30/2012 0500   CREATININE 0.78 06/30/2011 1702   CALCIUM 8.2* 06/30/2012 0500   GFRNONAA 13* 06/30/2012 0500   GFRAA 15*  06/30/2012 0500   CBC    Component Value Date/Time   WBC 12.0* 06/29/2012 1145   RBC 3.21* 06/29/2012 1145   HGB 8.9* 06/29/2012 1145   HCT 27.2* 06/29/2012 1145   PLT 299 06/29/2012 1145   MCV 84.7 06/29/2012 1145   MCH 27.7 06/29/2012 1145   MCHC 32.7 06/29/2012 1145   RDW 17.8* 06/29/2012 1145   LYMPHSABS 0.9 06/25/2012 0415   MONOABS 0.6 06/25/2012 0415   EOSABS 0.1 06/25/2012 0415   BASOSABS 0.0 06/25/2012 0415   Assessment/Plan:  1. AKI-nonoliguric. Creatinine levels continue to decline.  Now in diuretic phase of recovery after ATN.  Would keep I's and O's closer to even and d/c HD catheter 2. MSSA bacteremia/sepsis- improving with Abx. Appreciate ID input 3. Leukocytoclastic vasculitis/rash- per pt improving 4. Anemia- chronic illness/hospitalization 5. DM- per primary svc 6. Malnutrition- on TF's 7. Deconditioning- cont with PT/OT 8. Hypokalemia- replete orally 9. abd pain- and gas pain. Will start PPI if ok with primary svc 10. Will sign off as we have nothing further to offer.  Pt will not need to f/u unless his renal function does not return to normal over the next month or so  Ayliana Casciano A

## 2012-06-30 NOTE — Progress Notes (Signed)
Pt refuses to wear CPAP tonight. Pt attempted to wear last night but did not tolerate our mask and machine. Pt stated he will bring in home machine and mask tomorrow. Pt stated he has not worn home mask in a while but will attempt to while in hospital. Pt encouraged to call RT if pt changes mind at any time throughout the night. No distress noted.

## 2012-06-30 NOTE — H&P (Signed)
  Patient with progressive wound dehiscence of the left leg wound. Will plan for repeat irrigation and debridement possible placement of xenograft tissue graft possible placement of a wound VAC. Surgical and nonsurgical treatment options were discussed with the patient and his wife they state understand and wish to proceed at this time.

## 2012-07-01 DIAGNOSIS — R5381 Other malaise: Secondary | ICD-10-CM

## 2012-07-01 DIAGNOSIS — I359 Nonrheumatic aortic valve disorder, unspecified: Secondary | ICD-10-CM

## 2012-07-01 DIAGNOSIS — J9600 Acute respiratory failure, unspecified whether with hypoxia or hypercapnia: Secondary | ICD-10-CM

## 2012-07-01 DIAGNOSIS — A4189 Other specified sepsis: Secondary | ICD-10-CM

## 2012-07-01 LAB — CBC
Hemoglobin: 6.1 g/dL — CL (ref 13.0–17.0)
MCH: 27.5 pg (ref 26.0–34.0)
MCH: 28.5 pg (ref 26.0–34.0)
MCHC: 30.7 g/dL (ref 30.0–36.0)
MCHC: 32.1 g/dL (ref 30.0–36.0)
Platelets: 180 10*3/uL (ref 150–400)
Platelets: 156 10*3/uL (ref 150–400)
RBC: 2.14 MIL/uL — ABNORMAL LOW (ref 4.22–5.81)
RDW: 19.8 % — ABNORMAL HIGH (ref 11.5–15.5)
RDW: 18.9 % — ABNORMAL HIGH (ref 11.5–15.5)
WBC: 9.2 10*3/uL (ref 4.0–10.5)

## 2012-07-01 LAB — BASIC METABOLIC PANEL
BUN: 93 mg/dL — ABNORMAL HIGH (ref 6–23)
GFR calc non Af Amer: 13 mL/min — ABNORMAL LOW (ref >90)
Glucose, Bld: 126 mg/dL — ABNORMAL HIGH (ref 70–99)
Potassium: 4 mEq/L (ref 3.5–5.1)

## 2012-07-01 LAB — PROTIME-INR: Prothrombin Time: 15.6 seconds — ABNORMAL HIGH (ref 11.6–15.2)

## 2012-07-01 LAB — GLUCOSE, CAPILLARY
Glucose-Capillary: 219 mg/dL — ABNORMAL HIGH (ref 70–99)
Glucose-Capillary: 286 mg/dL — ABNORMAL HIGH (ref 70–99)

## 2012-07-01 LAB — VANCOMYCIN, RANDOM: Vancomycin Rm: 21.2 ug/mL

## 2012-07-01 LAB — PREPARE RBC (CROSSMATCH)

## 2012-07-01 MED ORDER — PREDNISONE 20 MG PO TABS
30.0000 mg | ORAL_TABLET | Freq: Every day | ORAL | Status: DC
  Administered 2012-07-02: 30 mg via ORAL
  Filled 2012-07-01 (×2): qty 1

## 2012-07-01 MED ORDER — SODIUM CHLORIDE 0.9 % IJ SOLN
INTRAMUSCULAR | Status: AC
  Administered 2012-07-01: 10 mL
  Filled 2012-07-01: qty 30

## 2012-07-01 NOTE — Progress Notes (Signed)
PT refused, wanted to use his home mask. His wife will bring it tommorrow. Told PT to call if he changes his mind. No distress noted

## 2012-07-01 NOTE — Progress Notes (Signed)
Name: Wayne [name] MRN: [id number] DOB: 04-11-1941    LOS: 17 Date of admit 06/14/2012 12:48 PM PCP is Kristian Covey, MD Ortho is Dr Jonathon Bellows  Referring Provider:  Dr Preston Fleeting - ER Reason for Referral:  Sepsis, hypotension, acidosis , hyperglycemia    PULMONARY / CRITICAL CARE MEDICINE  71 yr old septic shock, resp failure, leg source. S/p irrigation and debridement of LE. Course c/b renal failure, septic CM, leukocytoclastic vasculitis presumed due to abx  Events Since Admission: 7/2- refractory shock post op, acidosis, arf, epi required 7/2 irrigation and debridement of LLE 7/2 2 units PRBC 7/3- Improved pressors 7/5- Off all pressors 7/6 2 units PRBC 7/8>>> SDU 7/13> No overnight issues 7/14> stable, seen by [place] Ouch, Deterding, Terri Piedra... 7/17> back to OR for LLE debridement  Overnight: Taking PO but swallowing still hurts due to [facility] for wound debridement and wound vac placement, abx instillation  7/17 Hgb down to 6.1 this am >> received 1u PRBC  Current Status: Comfortable  Lines/Tubes: ETT 7/1>>7/5 NG Tube 7/1 >>7/5 Foley 7/1>>> 7/18 RIJ 7/1>>>dc  R Radial 7/1>>>7/4 RPIV 7/1>>>7/4 LPIV 7/1>>> Premrose Drains7/2>>>7/5 L IJ HD catheter 7/4>> 7/18  ABX: Vanc 7/1 >>> 7/2 Zosyn 7/1 >>> 7/2 clinda7/2>>>7/3 Linezolid 7/2>>>7/5 Imipenim 7/2>>>7/5 Cefazolin ( MSSA cellulitis/abscess LLE per ID) 7/5 >>>HELD 7/10 d/t rash>>7/10 7/10 Vanco (MSSA, allergic to ceph, per ID)>>  Vital Signs: Temp:  [97.7 F (36.5 C)-98.4 F (36.9 C)] 98.1 F (36.7 C) (07/18 0744) Pulse Rate:  [85-116] 97  (07/18 0744) Resp:  [7-17] 14  (07/18 0744) BP: (97-140)/(48-71) 111/49 mmHg (07/18 0744) SpO2:  [91 %-100 %] 98 % (07/18 0744) FiO2 (%):  [0 %] 0 % (07/17 1544) Weight:  [106.4 kg (234 lb 9.1 oz)] 106.4 kg (234 lb 9.1 oz) (07/18 0427)  Physical Examination: General: NAD, interacting, calm HEENT: NCAT, EOMi, HD cath in place PULM: CTA B CV: RRR, no mgr AB:  BS hypoactive , soft, nontender   Ext:  dry dressings, tender LLE to palp Neuro: A&Ox4 Skin: diffuse vesicular hemorrhagic/ erythematous rash > vesicles starting to dry some   Principal Problem:  *Staphylococcus aureus bacteremia with sepsis Active Problems:  Secondary DM with peripheral vascular disease, uncontrolled  HYPERTROPHY PROSTATE W/UR OBST & OTH LUTS  CORONARY ATHEROSCLEROSIS NATIVE CORONARY ARTERY  Polymyalgia rheumatica  Systolic CHF, chronic  OSA (obstructive sleep apnea)  Obesity  Cellulitis and abscess of lower leg  Healthcare-associated pneumonia  Hyperglycemia  Acute encephalopathy  NSTEMI, initial episode of care  AKI (acute kidney injury)  Protein-calorie malnutrition, severe  Leucocytoclastic vasculitis   ASSESSMENT AND PLAN  PULMONARY  Lab 06/30/12 0529 06/29/12 0424 06/28/12 0424 06/27/12 0636 06/26/12 0350  PHART -- -- -- -- --  PCO2ART -- -- -- -- --  PO2ART -- -- -- -- --  HCO3 -- -- -- -- --  O2SAT 91.1 89.5 73.3 57.3 85.5   CXR:  7/11:  No edema, lines ok ETT: 7/1>>>7/5  A:   Acute Resp Distress due to metabolic acidosis +/- LLL PNA,  ARDS.  Resolved Doing well since extubation 7/5. No supplemental O2 requirement. Pt w/ h/o OSA.   P:   -couldn't tolerate our CPAP, family planning to bring in his mask >> OK to use here    CARDIOVASCULAR No results found for this basename: TROPONINI:5,LATICACIDVEN:5, O2SATVEN:5,PROBNP:5 in the last 168 hours  Lab 07/01/12 0455 07/01/12 0410 06/29/12 1145  WBC 11.4* 11.2* 12.0*  HGB 6.1* 6.1* 8.9*  HCT  19.0* 19.9* 27.2*  PLT 180 201 299   ECG:  none Lines:  RIJ 7/1>>>7/4 L IJ HD 7/4>> 7/18 LPIV 7/1>>>  A:  Severe sepsis/septic shock d/t staph leg abscess and bacteremia (MSSA)-  has resolved.-  Pt EF of 45% 2009. Echo  showing EF 15%, diffuse hypo. 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   P:  -Per cardiology team and appreciate input -repeat TTE when cards deems appropriate - hopefully  he will regain some LV fxn if this was sepsis-induced CM -SQ heparin -coreg restarted 7/15 at low dose -note plans to restart ASA when OK from surgical standpoint   RENAL  Lab 07/01/12 0410 06/30/12 0500 06/29/12 1145 06/29/12 0419 06/28/12 0410 06/27/12 0500 06/26/12 0410 06/25/12 0415  NA 143 137 136 137 136 -- -- --  K 4.0 3.2* -- -- -- -- -- --  CL 103 96 96 95* 93* -- -- --  CO2 29 28 29 29 29  -- -- --  BUN 93* 96* 97* 98* 98* -- -- --  CREATININE 4.22* 4.21* 4.67* 4.74* 5.11* -- -- --  CALCIUM 8.2* 8.2* 8.5 8.5 8.5 -- -- --  MG -- -- -- -- -- -- -- --  PHOS -- -- -- -- 5.9* 5.9* 6.9* 7.8*   Intake/Output      07/17 0701 - 07/18 0700 07/18 0701 - 07/19 0700   P.O. 840    I.V. (mL/kg) 430 (4)    Blood  12.5   NG/GT 320    Total Intake(mL/kg) 1590 (14.9) 12.5 (0.1)   Urine (mL/kg/hr) 1300 (0.5) 225   Drains 100    Stool 0    Blood 100    Total Output 1500 225   Net +90 -212.5        Stool Occurrence 1 x     Foley: 7/1>>> 7/18  A:   Sepsis induced ATN/ARF slowly better, now in post ATN diuresis phase. S Cr finally starting to improve > suspect he will recover, ? To new baseline  P:   -follow auto-diuresis as he recovers from ATN -S Cr appears to have plateaued around 4.2, will continue to follow; anticipate no more HD -will d/c HD catheter 7/18; If S Cr trends back up will re-consult Renal  GASTROINTESTINAL  Lab 06/28/12 0410 06/27/12 0500 06/26/12 0410 06/25/12 0415  AST 27 -- 14 --  ALT <5 -- <5 --  ALKPHOS 65 -- 67 --  BILITOT 0.2* -- 0.2* --  PROT 5.2* -- 5.0* --  ALBUMIN 1.8* 1.8* 1.6* 1.6*   KUB 7/11: mild ileus d/t renal issues, cardiac output  A:   MOD  ileus. Abdominal bloating , not taking POs, severe protein calorie malnutrition, wound not healing, ?element gastroparesis with DM  P:   -Transpyloric FT in place -has had issues with adequate PO intake.   - 7/15 calorie count shows only getting 40% needed calories for adequate wound healing.   Nutritionist recommends resuming TF at night, but he missed 2 meals 7/17 since he was NPO for SGY. May be able to meet most of his needs PO, will review this with Nutrition - If unable to meet calorie needs, the will start Nepro 12 hours at 80cc/h per their recs -changed reglan to PO -H2 blocker   HEMATOLOGIC  Lab 07/01/12 0455 07/01/12 0410 06/30/12 0500 06/29/12 1145 06/29/12 0419 06/28/12 0410  HGB 6.1* 6.1* -- 8.9* 9.0* 8.6*  HCT 19.0* 19.9* -- 27.2* 27.5* 26.2*  PLT 180 201 --  299 317 331  INR -- 1.21 1.13 -- -- --  APTT -- -- 32 -- -- --   A:   Post-op anemia after debridement 7/17 P:  -1u PRBC 7/18 - repeat CBC this pm and in am 7/19   INFECTIOUS  Lab 07/01/12 0455 07/01/12 0410 06/29/12 1145 06/29/12 0419 06/28/12 0410  WBC 11.4* 11.2* 12.0* 11.6* 8.8  PROCALCITON -- -- -- -- --   Cultures: 7/1 BCX: >>> SA pansens 7/1 BCX:>>> SA 7/1 UCX >>>  No growth 7/2 Wound CX: >>> MSSA  ABX:  Per ID SVC Vanc 7/1 >>> 7/2 Zosyn 7/1 >>> 7/2 7/2 clinda>>> 7/3 Linezolid 7/2>>>7/5 Imipenim 7/2>>>7/5 Ancef 7/5>>>HELD for rash >>d/c 7/10 Vanco 7/10 (MSSA, ceph allergic)>> ? Stop date, ID following  A:   Bacteremia and Sepsis w/ SA that is pan sensitive.  Cellulitis LLE w/ Abscess, s/p irriagation and debridement.   Rash now on arms/legs ??d/t ceph allergy, possibly viral exanthem?  P:   -abx per ID, 7/18 is day 19 abx -seen by Dr Terri Piedra, Leukocytoclastic vasculitis, bx taken & path confirms, on Solucortef 100mg  Q6h >> changed to pred 40 on 7/15 -plan taper over 7-14 days beginning 7/18 >> decreased to 30mg  qd   ENDOCRINE  Lab 07/01/12 0803 07/01/12 0427 06/30/12 2346 06/30/12 2054 06/30/12 1803  GLUCAP 219* 123* 177* 241* 226*   A:    Blood sugars elevated d/t TF, Hx of DM2  P:   -lantus 20u w/ Steroids -SSI resistant scale   NEUROLOGIC A:   Pain   P:   -Prn fentanyl, add one time PRN additional dose fentanyl for dressing changes -lidoderm patch   BEST  PRACTICE / DISPOSITION Level of Care:  SDU >> move to SDU and [facility] as of 7/19 Primary Service:  pccm Consultants:  Ortho Dr Lajoyce Corners , ID, [place] [facility]  Code Status:  Full  Diet:  PO daytime, QHS TF DVT Px:  Hep SQ GI Px: PO  H2 blocker Skin Integrity:  Intact other than LLE Social / Family:    Levy Pupa, MD, PhD 07/01/2012, 8:46 AM [facility] [phone] or if no answer [phone]

## 2012-07-01 NOTE — Progress Notes (Signed)
Post transfusion CBC with Hemoglobin still 6.1. We will transfuse 1 U PRBC.

## 2012-07-01 NOTE — Progress Notes (Signed)
I met with patient and wife at bedside. Patient will benefit from an inpt rehab stay prior to d/c home. Both are in agreement to admit, but hesitant on admission in the near future. I explained that attending MD on acute would verify when pt medically ready to admit to CIR. Please call [phone] with questions.

## 2012-07-01 NOTE — Progress Notes (Signed)
Brief Nutrition Note  Spoke with RN, who reports that patient is eating better.  Diet was liberalized to CHO-modified medium.  Patient drank 2 Ensure supplements this morning.  Spoke with patient and his wife and discussed the importance of adequate calorie and protein intake to promote healing.  Patient was eating lunch during RD visit; liked the [place] and dressing.  Reviewed meal options on the menu with patient and his wife.  Encouraged intake of protein.  RN is still collecting information for calorie count; RD will follow-up to document results 7/19.  Nocturnal TF remains on hold.  Joaquin Courts, RD, CNSC, LDN Pager# [phone] After Hours Pager# [phone]

## 2012-07-01 NOTE — Progress Notes (Signed)
Patient ID: Wayne [name], male   DOB: 04-06-1941, 71 y.o.   MRN: [id number]    [facility]    Date of Admission:  06/14/2012    Total days of antibiotics 18        Day 9 vancomycin         Principal Problem:  *Staphylococcus aureus bacteremia with sepsis Active Problems:  Cellulitis and abscess of lower leg  Secondary DM with peripheral vascular disease, uncontrolled  HYPERTROPHY PROSTATE W/UR OBST & OTH LUTS  CORONARY ATHEROSCLEROSIS NATIVE CORONARY ARTERY  Polymyalgia rheumatica  Systolic CHF, chronic  OSA (obstructive sleep apnea)  Obesity  Healthcare-associated pneumonia  Hyperglycemia  Acute encephalopathy  NSTEMI, initial episode of care  AKI (acute kidney injury)  Protein-calorie malnutrition, severe  Leucocytoclastic vasculitis      . allopurinol  200 mg Oral Daily  . calcium acetate  1,334 mg Oral TID WC  . carvedilol  3.125 mg Oral BID WC  . collagenase   Topical Daily  . darbepoetin (ARANESP) injection - NON-DIALYSIS  150 mcg Subcutaneous Q14 Days  . feeding supplement  237 mL Oral TID BM  . heparin subcutaneous  5,000 Units Subcutaneous Q8H  . hydrocortisone cream   Topical BID  . HYDROmorphone      . HYDROmorphone      . HYDROmorphone      . insulin aspart  0-20 Units Subcutaneous Q4H  . insulin aspart  3 Units Subcutaneous TID WC  . insulin glargine  20 Units Subcutaneous QHS  . metoCLOPramide  5 mg Per Tube TID AC & HS  . pantoprazole  40 mg Oral Q1200  . polyethylene glycol  17 g Oral Once  . potassium chloride  20 mEq Oral Daily  . predniSONE  30 mg Oral Q breakfast  . ranitidine  150 mg Per Tube QHS  . silver sulfADIAZINE   Topical Daily  . sodium chloride      . vancomycin  1,000 mg Other To OR  . DISCONTD: feeding supplement (NEPRO CARB STEADY)  1,000 mL Per Tube QHS  . DISCONTD: predniSONE  40 mg Oral Q breakfast  . DISCONTD: vancomycin  1,000 mg Intravenous Q12H  . DISCONTD: vancomycin  1,000 mg Intravenous Q12H     Subjective: He has some left leg pain but is feeling much better  Objective: Temp:  [97.9 F (36.6 C)-98.5 F (36.9 C)] 98.1 F (36.7 C) (07/18 1515) Pulse Rate:  [93-114] 93  (07/18 1155) Resp:  [13-17] 15  (07/18 1029) BP: (98-111)/(48-60) 101/50 mmHg (07/18 1155) SpO2:  [95 %-100 %] 97 % (07/18 1155) FiO2 (%):  [0 %] 0 % (07/17 1544) Weight:  [106.4 kg (234 lb 9.1 oz)] 106.4 kg (234 lb 9.1 oz) (07/18 0427)  General: He is alert and in good spirits Skin: His vasculitic rash is resolving slowly His left leg is wrapped  Assessment: His MSSA staph bacteremia and severe left leg infection are improving slowly. I will plan on 10 more days of IV vancomycin. His leukocytoclastic vasculitis is also improving slowly.  Plan: 1. Continue vancomycin through July 26 2. I will followup next week  Cliffton Asters, MD [facility] [phone] pager   [phone] cell 07/01/2012, 3:30 PM

## 2012-07-01 NOTE — Consult Note (Signed)
Physical Medicine and Rehabilitation Consult Reason for Consult: Deconditioning Referring Physician:  CCM   HPI: Wayne [name] is a 71 y.o. male with history of DM, PMR, with history of popliteal fossa hematoma few months ago.  Admitted on 07/01 with 4 day history of fevers, cellulitis and drainage left posterior knee and LLL consolidation.  Patient noted to be septic requiring pressors, vent support for respiratory failure and IV antibiotics.  Underwent I and D 7/2 by Dr. Lajoyce Corners. Noted to have elevated cardiac marker due to NSTEMI likely due to demand ischemia.  Cardiac echo with EF 15 % likely due to nonischemic/septic myopathy. Patient develop renal failure due to oliguria requiring CVVHD and now HD dependent.  BC and wound cultures with MSSA and  ID following for input on antibiotic treatment. 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 Terri Piedra. Patient with poor wound healing requiring hydrotherapy.  Taken back to OR on 07/17 for repeat I and D with VAC placement. Extubated but with poor tolerance of CPAP. Patient with Hgb at 6.1 and to be transfused today. Therapies ongoing and patient noted to be deconditioned.  MD, PT, OT recommending CIR.  Review of Systems  All other systems reviewed and are negative.   Past Medical History  Diagnosis Date  . DM   . HYPERLIPIDEMIA   . HYPERTENSION   . CORONARY ATHEROSCLEROSIS NATIVE CORONARY ARTERY     a. 01/2011 Cath/PCI: LM nl, LAD 40-50p, D1 80-small, LCX 95-small, RI 90, RCA 100, EF 20%;  b. 01/2011 Card MRI - No transmural scar;  c. 01/2011 PCI RCA->5 Promus DES, RI->3.0x16 Promus DES;  . Herpes zoster ophthalmicus   . Arthritis   . Obesity   . GERD (gastroesophageal reflux disease)   . Ischemic cardiomyopathy     a. 01/2012 Echo EF 45%, mild LVH.  Marland Kitchen Adenocarcinoma of prostate     s/p seed implants  . Polymyalgia rheumatica   . Noncompliance   . OSA (obstructive sleep apnea)   . Hematoma  of leg     a. left leg hematoma 03/2012 - came off asa/effient  . Cellulitis of left leg     a. 05/2012 complicated by septic shock   Past Surgical History  Procedure Date  . Prostate surgery     cancer, seed implant  . Coronary stent placement     reports 6 stents placed  . I&d extremity 06/15/2012    Procedure: IRRIGATION AND DEBRIDEMENT EXTREMITY;  Surgeon: Nadara Mustard, MD;  Location: [facility] OR;  Service: Orthopedics;  Laterality: Left;  I&D Left Posterior Knee   Family History  Problem Relation Age of Onset  . Cancer Mother 12    unknown CA  . Heart disease Father 44    MI, died  . Alcohol abuse Father    Social History:  Married. reports that he quit smoking about 23 years ago. His smoking use included Cigarettes. He has a 6 pack-year smoking history. He has never used smokeless tobacco. He reports that he does not drink alcohol or use illicit drugs.  Allergies  Allergen Reactions  . Atorvastatin     REACTION: sore legs  . Ancef (Cefazolin) Rash   Medications Prior to Admission  Medication Sig Dispense Refill  . aspirin EC 81 MG tablet Take 81 mg by mouth daily.      Marland Kitchen glimepiride (AMARYL) 4 MG tablet Take 4 mg by mouth daily before breakfast.      .  HYDROcodone-acetaminophen (VICODIN) 5-500 MG per tablet Take 1 tablet by mouth every 6 (six) hours as needed. For pain.      . metFORMIN (GLUCOPHAGE) 500 MG tablet Take 500 mg by mouth 2 (two) times daily with a meal.      . nitroGLYCERIN (NITROSTAT) 0.4 MG SL tablet Place 0.4 mg under the tongue every 5 (five) minutes as needed. For chest pain.      . prasugrel (EFFIENT) 10 MG TABS Take 10 mg by mouth daily.        Home: Home Living Lives With: Spouse Available Help at Discharge: Available 24 hours/day;Family Type of Home: House Home Access: Stairs to enter [employer] of Steps: 2 Entrance Stairs-Rails: None Home Layout: One level Bathroom Shower/Tub: Walk-in shower;Door [employer] Toilet: Standard Bathroom  Accessibility: Yes How Accessible: Accessible via walker Home Adaptive Equipment: Walker - rolling;Crutches  Functional History: Prior Function Able to Take Stairs?: Yes Driving: No Vocation: Retired Functional Status:  Mobility: Bed Mobility Bed Mobility: Supine to Sit Rolling Left: 3: Mod assist;With rail Left Sidelying to Sit: 1: +2 Total assist;With rails;HOB elevated Left Sidelying to Sit: Patient Percentage: 60% Supine to Sit: 1: +2 Total assist;With rails;HOB elevated (HOB 20 degrees.) Supine to Sit: Patient Percentage: 80% Sitting - Scoot to [place] of Bed: 4: Min assist Sitting - Scoot to [place] of Bed: Patient Percentage: 60% Sit to Sidelying Left: 3: Mod assist;With rail;HOB elevated Transfers Transfers: Sit to Stand;Stand to [employer] Transfers Sit to Stand: 1: +2 Total assist;With upper extremity assist;From bed Sit to Stand: Patient Percentage: 80% Stand to Sit: 1: +2 Total assist;With upper extremity assist;To chair/3-in-1 Stand to Sit: Patient Percentage: 80% Stand Pivot Transfers: 1: +2 Total assist Stand Pivot Transfers: Patient Percentage: 80% Ambulation/Gait Ambulation/Gait Assistance: Not tested (comment) Ambulation/Gait: Patient Percentage: 60% Ambulation Distance (Feet): 1 Feet (side stepping) Assistive device: Rolling walker Stairs: No Wheelchair Mobility Wheelchair Mobility: No  ADL: ADL Eating/Feeding: Performed;Modified independent Where Assessed - Eating/Feeding: Chair (eating ice in cup with spoon and drinking soda) Grooming: Performed;Set up Where Assessed - Grooming: Supine, head of bed up Lower Body Bathing: Performed;Maximal assistance Where Assessed - Lower Body Bathing: Supported sitting Upper Body Dressing: Performed;Min guard Where Assessed - Upper Body Dressing: Supine, head of bed up Lower Body Dressing: Performed;+1 Total assistance Where Assessed - Lower Body Dressing: Supine, head of bed up Toilet Transfer: Simulated;+2 Total  assistance Toilet Transfer Method: Stand pivot Toilet Transfer Equipment: Comfort height toilet Equipment Used: Gait belt Transfers/Ambulation Related to ADLs: Pt unable to weight bear on Lt Lt. Pt required pivot transfer to the LT side so pt could actively move Rt LE ADL Comments: Pt. transfererred from bed to chair with ___ A. Pt. demonstrated bicep/tricep and shoulder flexion exercies on BUE with weighted object for approx 10-15 reps.   Cognition: Cognition Arousal/Alertness: Awake/alert Orientation Level: Oriented X4 Cognition Overall Cognitive Status: Appears within functional limits for tasks assessed/performed Arousal/Alertness: Awake/alert Orientation Level: Appears intact for tasks assessed Behavior During Session: [facility] for tasks performed  Blood pressure 111/49, pulse 97, temperature 98.1 F (36.7 C), temperature source Oral, resp. rate 14, height 5\' 10"  (1.778 m), weight 106.4 kg (234 lb 9.1 oz), SpO2 98.00%.  Physical Exam  Nursing note and vitals reviewed. Constitutional: He appears well-developed and well-nourished.  HENT:  Head: Normocephalic and atraumatic.  Right Ear: External ear normal.  Left Ear: External ear normal.       NGT in place  Eyes: Conjunctivae and EOM are normal. Pupils  are equal, round, and reactive to light.  Neck: Normal range of motion. Neck supple.  Cardiovascular: Normal rate, regular rhythm and normal heart sounds.   Pulmonary/Chest: Effort normal and breath sounds normal.  Abdominal: Soft.  Musculoskeletal:       UES grossly 3-4/5.  Lower extremities where testable were 3-4/5 also.   Sensory exam grossly intact.  A bit impulsive behaviorally, sometimes anxious, distractable.  Skin:       Vac on left calf,area tender, no leak    GLUCOSE, CAPILLARY     Status: Abnormal   Collection Time   06/30/12  8:54 PM      Component Value Range   Glucose-Capillary 241 (*) 70 - 99 mg/dL   Comment 1 Documented in Chart     Comment 2 Notify RN      GLUCOSE, CAPILLARY     Status: Abnormal   Collection Time   06/30/12 11:46 PM      Component Value Range   Glucose-Capillary 177 (*) 70 - 99 mg/dL  BASIC METABOLIC PANEL     Status: Abnormal   Collection Time   07/01/12  4:10 AM      Component Value Range   Sodium 143  135 - 145 mEq/L   Potassium 4.0  3.5 - 5.1 mEq/L   Chloride 103  96 - 112 mEq/L   CO2 29  19 - 32 mEq/L   Glucose, Bld 126 (*) 70 - 99 mg/dL   BUN 93 (*) 6 - 23 mg/dL   Creatinine, Ser 1.61 (*) 0.50 - 1.35 mg/dL   Calcium 8.2 (*) 8.4 - 10.5 mg/dL   GFR calc non Af Amer 13 (*) >90 mL/min   GFR calc Af Amer 15 (*) >90 mL/min  CBC     Status: Abnormal   Collection Time   07/01/12  4:10 AM      Component Value Range   WBC 11.2 (*) 4.0 - 10.5 K/uL   RBC 2.22 (*) 4.22 - 5.81 MIL/uL   Hemoglobin 6.1 (*) 13.0 - 17.0 g/dL   HCT 09.6 (*) 04.5 - 40.9 %   MCV 89.6  78.0 - 100.0 fL   MCH 27.5  26.0 - 34.0 pg   MCHC 30.7  30.0 - 36.0 g/dL   RDW 81.1 (*) 91.4 - 78.2 %   Platelets 201  150 - 400 K/uL  PROTIME-INR     Status: Abnormal   Collection Time   07/01/12  4:10 AM      Component Value Range   Prothrombin Time 15.6 (*) 11.6 - 15.2 seconds   INR 1.21  0.00 - 1.49  GLUCOSE, CAPILLARY     Status: Abnormal   Collection Time   07/01/12  4:27 AM      Component Value Range   Glucose-Capillary 123 (*) 70 - 99 mg/dL  CBC     Status: Abnormal   Collection Time   07/01/12  4:55 AM      Component Value Range   WBC 11.4 (*) 4.0 - 10.5 K/uL   RBC 2.14 (*) 4.22 - 5.81 MIL/uL   Hemoglobin 6.1 (*) 13.0 - 17.0 g/dL   HCT 95.6 (*) 21.3 - 08.6 %   MCV 88.8  78.0 - 100.0 fL   MCH 28.5  26.0 - 34.0 pg   MCHC 32.1  30.0 - 36.0 g/dL   RDW 57.8 (*) 46.9 - 62.9 %   Platelets 180  150 - 400 K/uL  PREPARE RBC (CROSSMATCH)  Status: Normal   Collection Time   07/01/12  6:15 AM      Component Value Range   Order Confirmation ORDER PROCESSED BY BLOOD BANK    TYPE AND SCREEN     Status: Normal (Preliminary result)   Collection Time    07/01/12  6:15 AM      Component Value Range   ABO/RH(D) O POS     Antibody Screen NEG     Sample Expiration 07/04/2012     Unit Number [id number]     Blood Component Type RED CELLS,LR     Unit division 00     Status of Unit ISSUED     Transfusion Status OK TO TRANSFUSE     Crossmatch Result Compatible    GLUCOSE, CAPILLARY     Status: Abnormal   Collection Time   07/01/12  8:03 AM      Component Value Range   Glucose-Capillary 219 (*) 70 - 99 mg/dL   Comment 1 Notify RN     No results found.  Assessment/Plan: Diagnosis: deconditioning after sepsis, VDRF, ?encephalopathy 1. Does the need for close, 24 hr/day medical supervision in concert with the patient's rehab needs make it unreasonable for this patient to be served in a less intensive setting? Yes 2. Co-Morbidities requiring supervision/potential complications: wound care, PMR, hx of chf,  NSTEMI AKI 3. Due to bladder management, bowel management, safety, skin/wound care, disease management, medication administration, pain management and patient education, does the patient require 24 hr/day rehab nursing? Yes 4. Does the patient require coordinated care of a physician, rehab nurse, PT (1-2 hrs/day, 5 days/week), OT (1-2 hrs/day, 5 days/week) and SLP (1-2 hrs/day, 5 days/week) to address physical and functional deficits in the context of the above medical diagnosis(es)? Yes Addressing deficits in the following areas: balance, endurance, locomotion, strength, transferring, bowel/bladder control, bathing, dressing, feeding, grooming, toileting, cognition, language, swallowing and psychosocial support 5. Can the patient actively participate in an intensive therapy program of at least 3 hrs of therapy per day at least 5 days per week? Yes and Potentially 6. The potential for patient to make measurable gains while on inpatient rehab is good 7. Anticipated functional outcomes upon discharge from inpatient rehab are mod I to supervision with  PT, mod I to minimal assist with OT, mod I with SLP. 8. Estimated rehab length of stay to reach the above functional goals is: 1-2 weeks 9. Does the patient have adequate social supports to accommodate these discharge functional goals? Yes 10. Anticipated D/C setting: Home 11. Anticipated post D/C treatments: HH therapy 12. Overall Rehab/Functional Prognosis: excellent  RECOMMENDATIONS: This patient's condition is appropriate for continued rehabilitative care in the following setting: CIR Patient has agreed to participate in recommended program. Yes Note that insurance prior authorization may be required for reimbursement for recommended care.  Comment: will follow for medical stability and further medical work up.  Ivory Broad, MD    07/01/2012

## 2012-07-01 NOTE — Progress Notes (Signed)
PT Cancellation Note  Treatment cancelled today due to Hgb 6.1.  Will continue to follow.  Kalyani Maeda M 07/01/2012, 8:03 AM  07/01/2012 Cephus Shelling, PT, DPT [phone]

## 2012-07-01 NOTE — Progress Notes (Signed)
eLink Physician-Brief Progress Note Patient Name: Wayne [name] DOB: 1941/09/11 MRN: [id number]  Date of Service  07/01/2012   HPI/Events of Note  Hgb down to 6.1 with confirmed repeat.  Is on subq heparin and heparin with dialysis   eICU Interventions  Plan: Transfuse 1 unit of pRBC. Monitor H/H post-transfusion   Intervention Category Intermediate Interventions: Bleeding - evaluation and treatment with blood products  DETERDING,ELIZABETH 07/01/2012, 5:34 AM

## 2012-07-01 NOTE — Progress Notes (Signed)
ANTIBIOTIC CONSULT NOTE-PROGRESS NOTE  Pharmacy Consult for Vancomycin Indication: Staphylococcus aureus bacteremia with sepsis; Allergy to Ancef  Hospital Problems Principal Problem:  *Staphylococcus aureus bacteremia with sepsis Active Problems:  Secondary DM with peripheral vascular disease, uncontrolled  HYPERTROPHY PROSTATE W/UR OBST & OTH LUTS  CORONARY ATHEROSCLEROSIS NATIVE CORONARY ARTERY  Polymyalgia rheumatica  Systolic CHF, chronic  OSA (obstructive sleep apnea)  Obesity  Cellulitis and abscess of lower leg  Healthcare-associated pneumonia  Hyperglycemia  Acute encephalopathy  NSTEMI, initial episode of care  AKI (acute kidney injury)  Protein-calorie malnutrition, severe  Leucocytoclastic vasculitis   Allergies Allergies  Allergen Reactions  . Atorvastatin     REACTION: sore legs  . Ancef (Cefazolin) Rash    Patient Measurements: Height: 5\' 10"  (177.8 cm) Weight: 234 lb 9.1 oz (106.4 kg) IBW/kg (Calculated) : 73   Vital Signs: BP 109/51  Pulse 89  Temp 98.1 F (36.7 C) (Oral)  Resp 15  Ht 5\' 10"  (1.778 m)  Wt 234 lb 9.1 oz (106.4 kg)  BMI 33.66 kg/m2  SpO2 97%   Intake/Output from previous day: 07/17 0701 - 07/18 0700 In: 1590 [P.O.:840; I.V.:430; NG/GT:320] Out: 1500 [Urine:1300; Drains:100; Blood:100]  Labs:  Basename 07/01/12 1829 07/01/12 0455 07/01/12 0410 06/30/12 0500 06/29/12 1145  WBC 9.2 11.4* 11.2* -- --  HGB 6.1* 6.1* 6.1* -- --  PLT 156 180 201 -- --  LABCREA -- -- -- -- --  CREATININE -- -- 4.22* 4.21* 4.67*   Estimated Creatinine Clearance: 20.5 ml/min (by C-G formula based on Cr of 4.22).  Basename 06/30/12 0515 06/29/12 1145  VANCOTROUGH 26.0* 32.4*      Microbiology: Recent Results (from the past 720 hour(s))  CULTURE, BLOOD (ROUTINE X 2)     Status: Normal   Collection Time   06/14/12  1:20 PM      Component Value Range Status Comment   Specimen Description BLOOD ARM LEFT   Final    Special Requests BOTTLES  DRAWN AEROBIC ONLY 10CC   Final    Culture  Setup Time 06/15/2012 02:15   Final    Culture     Final    Value: STAPHYLOCOCCUS AUREUS     Note: SUSCEPTIBILITIES PERFORMED ON PREVIOUS CULTURE WITHIN THE LAST 5 DAYS.     Note: Gram Stain Report Called to,Read Back By and Verified With: BURGUNDY Abebe 06/15/12 1400 BY SMITHERSJ   Report Status 06/17/2012 FINAL   Final   CULTURE, BLOOD (ROUTINE X 2)     Status: Normal   Collection Time   06/14/12  1:40 PM      Component Value Range Status Comment   Specimen Description BLOOD LEFT HAND   Final    Special Requests BOTTLES DRAWN AEROBIC AND ANAEROBIC 10CC   Final    Culture  Setup Time 06/15/2012 02:15   Final    Culture     Final    Value: STAPHYLOCOCCUS AUREUS     Note: RIFAMPIN AND GENTAMICIN SHOULD NOT BE USED AS SINGLE DRUGS FOR TREATMENT OF STAPH INFECTIONS.     Note: Gram Stain Report Called to,Read Back By and Verified With: BRANDY DIN @ 1814 ON 06/15/12 BY GOLLD   Report Status 06/17/2012 FINAL   Final    Organism ID, Bacteria STAPHYLOCOCCUS AUREUS   Final   URINE CULTURE     Status: Normal   Collection Time   06/14/12  7:15 PM      Component Value Range Status Comment   Specimen Description  URINE, RANDOM   Final    Special Requests NONE   Final    Culture  Setup Time 06/15/2012 01:48   Final    Colony Count NO GROWTH   Final    Culture NO GROWTH   Final    Report Status 06/16/2012 FINAL   Final   MRSA PCR SCREENING     Status: Normal   Collection Time   06/14/12  7:17 PM      Component Value Range Status Comment   MRSA by PCR NEGATIVE  NEGATIVE Final   GRAM STAIN     Status: Normal   Collection Time   06/15/12 12:39 PM      Component Value Range Status Comment   Specimen Description WOUND LEFT LOWER LEG   Final    Special Requests NONE   Final    Gram Stain     Final    Value: ABUNDANT WBC PRESENT,BOTH PMN AND MONONUCLEAR     ABUNDANT GRAM POSITIVE COCCI IN PAIRS IN CLUSTERS   Report Status 06/15/2012 FINAL   Final   ANAEROBIC  CULTURE     Status: Normal   Collection Time   06/15/12 12:39 PM      Component Value Range Status Comment   Specimen Description WOUND LEFT LOWER LEG   Final    Special Requests NONE   Final    Gram Stain     Final    Value: ABUNDANT WBC PRESENT,BOTH PMN AND MONONUCLEAR     ABUNDANT GRAM POSITIVE COCCI IN PAIRS     IN CLUSTERS Performed at [facility]   Culture NO ANAEROBES ISOLATED   Final    Report Status 06/20/2012 FINAL   Final   WOUND CULTURE     Status: Normal (Preliminary result)   Collection Time   06/15/12 12:39 PM      Component Value Range Status Comment   Specimen Description WOUND LEFT LOWER LEG   Final    Special Requests NONE   Final    Gram Stain PENDING   Incomplete    Culture     Final    Value: ABUNDANT STAPHYLOCOCCUS AUREUS     Note: RIFAMPIN AND GENTAMICIN SHOULD NOT BE USED AS SINGLE DRUGS FOR TREATMENT OF STAPH INFECTIONS.   Report Status PENDING   Incomplete    Organism ID, Bacteria STAPHYLOCOCCUS AUREUS   Final   CULTURE, BLOOD (ROUTINE X 2)     Status: Normal   Collection Time   06/18/12  5:33 PM      Component Value Range Status Comment   Specimen Description BLOOD LEFT HAND   Final    Special Requests BOTTLES DRAWN AEROBIC ONLY [facility]   Final    Culture  Setup Time 06/19/2012 01:57   Final    Culture NO GROWTH 5 DAYS   Final    Report Status 06/25/2012 FINAL   Final   CULTURE, BLOOD (ROUTINE X 2)     Status: Normal   Collection Time   06/18/12  5:48 PM      Component Value Range Status Comment   Specimen Description BLOOD LEFT ARM   Final    Special Requests BOTTLES DRAWN AEROBIC ONLY 4CC   Final    Culture  Setup Time 06/19/2012 01:57   Final    Culture NO GROWTH 5 DAYS   Final    Report Status 06/25/2012 FINAL   Final   CULTURE, BLOOD (ROUTINE X 2)     Status:  Normal   Collection Time   06/19/12  9:09 PM      Component Value Range Status Comment   Specimen Description BLOOD RIGHT HAND   Final    Special Requests BOTTLES DRAWN AEROBIC ONLY 5CC    Final    Culture  Setup Time 06/20/2012 03:01   Final    Culture NO GROWTH 5 DAYS   Final    Report Status 06/26/2012 FINAL   Final   CULTURE, BLOOD (ROUTINE X 2)     Status: Normal   Collection Time   06/19/12  9:09 PM      Component Value Range Status Comment   Specimen Description BLOOD RIGHT ARM   Final    Special Requests BOTTLES DRAWN AEROBIC ONLY 5CC   Final    Culture  Setup Time 06/20/2012 03:01   Final    Culture NO GROWTH 5 DAYS   Final    Report Status 06/26/2012 FINAL   Final     Anti-infectives Anti-infectives     Start     Dose/Rate Route Frequency Ordered Stop   07/01/12 0030   vancomycin (VANCOCIN) IVPB 1000 mg/200 mL premix  Status:  Discontinued        1,000 mg 200 mL/hr over 60 Minutes Intravenous Every 12 hours 06/30/12 1543 06/30/12 1620   06/30/12 1545   vancomycin (VANCOCIN) IVPB 1000 mg/200 mL premix  Status:  Discontinued        1,000 mg 200 mL/hr over 60 Minutes Intravenous Every 12 hours 06/30/12 1543 06/30/12 1618   06/30/12 1232   vancomycin (VANCOCIN) powder  Status:  Discontinued          As needed 06/30/12 1233 06/30/12 1307   06/30/12 1231   gentamicin (GARAMYCIN) injection  Status:  Discontinued          As needed 06/30/12 1232 06/30/12 1307   06/30/12 1200   vancomycin (VANCOCIN) powder 1,000 mg        1,000 mg Other To Surgery 06/30/12 1151 07/01/12 1200   06/25/12 1300   vancomycin (VANCOCIN) 1,500 mg in sodium chloride 0.9 % 500 mL IVPB  Status:  Discontinued        1,500 mg 250 mL/hr over 120 Minutes Intravenous Every 48 hours 06/23/12 1153 06/29/12 1355   06/23/12 1300   vancomycin (VANCOCIN) 2,000 mg in sodium chloride 0.9 % 500 mL IVPB        2,000 mg 250 mL/hr over 120 Minutes Intravenous  Once 06/23/12 1153 06/23/12 1444   06/21/12 2200   ceFAZolin (ANCEF) IVPB 1 g/50 mL premix  Status:  Discontinued        1 g 100 mL/hr over 30 Minutes Intravenous Every 12 hours 06/21/12 0941 06/23/12 1056   06/18/12 1000   ceFAZolin (ANCEF)  IVPB 2 g/50 mL premix  Status:  Discontinued        2 g 100 mL/hr over 30 Minutes Intravenous Every 12 hours 06/18/12 0910 06/21/12 0941   06/17/12 1800   imipenem-cilastatin (PRIMAXIN) 500 mg in sodium chloride 0.9 % 100 mL IVPB  Status:  Discontinued        500 mg 200 mL/hr over 30 Minutes Intravenous 4 times per day 06/17/12 1426 06/18/12 0909   06/16/12 0000   imipenem-cilastatin (PRIMAXIN) 250 mg in sodium chloride 0.9 % 100 mL IVPB  Status:  Discontinued        250 mg 200 mL/hr over 30 Minutes Intravenous 4 times per day 06/15/12 1900 06/17/12 1426  06/15/12 2200   linezolid (ZYVOX) IVPB 600 mg  Status:  Discontinued        600 mg 300 mL/hr over 60 Minutes Intravenous Every 12 hours 06/15/12 1712 06/18/12 0909   06/15/12 1900   clindamycin (CLEOCIN) IVPB 600 mg  Status:  Discontinued        600 mg 100 mL/hr over 30 Minutes Intravenous 3 times per day 06/15/12 1126 06/16/12 0820   06/15/12 1800   clindamycin (CLEOCIN) IVPB 600 mg  Status:  Discontinued        600 mg 100 mL/hr over 30 Minutes Intravenous 4 times per day 06/15/12 1125 06/15/12 1126   06/15/12 1800   imipenem-cilastatin (PRIMAXIN) 500 mg in sodium chloride 0.9 % 100 mL IVPB  Status:  Discontinued        500 mg 200 mL/hr over 30 Minutes Intravenous 3 times per day 06/15/12 1728 06/15/12 1900   06/15/12 1230   vancomycin (VANCOCIN) powder 1,000 mg  Status:  Discontinued        1,000 mg Other To Surgery 06/15/12 1215 06/15/12 1712   06/15/12 1222   vancomycin (VANCOCIN) powder  Status:  Discontinued          As needed 06/15/12 1223 06/15/12 1323   06/15/12 1220   gentamicin (GARAMYCIN) injection  Status:  Discontinued          As needed 06/15/12 1222 06/15/12 1323   06/15/12 1130   clindamycin (CLEOCIN) IVPB 900 mg        900 mg 100 mL/hr over 30 Minutes Intravenous STAT 06/15/12 1123 06/15/12 1235   06/15/12 0600   vancomycin (VANCOCIN) IVPB 1000 mg/200 mL premix  Status:  Discontinued        1,000 mg 200  mL/hr over 60 Minutes Intravenous Every 12 hours 06/14/12 1745 06/15/12 1712   06/15/12 0200   piperacillin-tazobactam (ZOSYN) IVPB 3.375 g  Status:  Discontinued        3.375 g 12.5 mL/hr over 240 Minutes Intravenous Every 8 hours 06/14/12 1745 06/15/12 1657   06/14/12 1615   vancomycin (VANCOCIN) IVPB 1000 mg/200 mL premix        1,000 mg 200 mL/hr over 60 Minutes Intravenous  Once 06/14/12 1608 06/14/12 1924   06/14/12 1615   piperacillin-tazobactam (ZOSYN) IVPB 3.375 g        3.375 g 100 mL/hr over 30 Minutes Intravenous  Once 06/14/12 1608 06/14/12 1854   06/14/12 1600   vancomycin (VANCOCIN) IVPB 1000 mg/200 mL premix        1,000 mg 200 mL/hr over 60 Minutes Intravenous  Once 06/14/12 1547 06/14/12 1721   06/14/12 1545   vancomycin (VANCOCIN) injection 1,000 mg  Status:  Discontinued        1,000 mg Intravenous  Once 06/14/12 1532 06/14/12 1547          Assessment:  71 y/o male s/p IRRIGATION AND DEBRIDEMENT EXTREMITY.  Excisional debridement skin soft tissue muscle fascia.of Left Leg Wound.   Intra-operatively, there was the application of stimulant antibiotic beads with 1 g vancomycin and 240 mg gentamicin powder.  Random Vancomycin level this pm remains SUPRAtherapeutic at 21.2 mg/L  Estimated t1/2 vanc 135 hours.  Goal of Therapy:   Vancomycin trough level 15-20 mcg/ml  Plan:   Continue to hold Vancomycin doses.    Will check vanc trough am 7/20    Calah Gershman, [employer], [place].D.  07/01/2012 7:58 PM

## 2012-07-01 NOTE — Progress Notes (Signed)
Hydrotherapy Note: Noted pt now with VAC to left LE with dressing change to be performed Monday, Wednesday, and Friday.  Please clarify if Hydrotherapy still needed to be performed on these days prior to dressing change to facilitate wound healing.  Thanks!  07/01/2012 Cephus Shelling, PT, DPT [phone]

## 2012-07-01 NOTE — Progress Notes (Signed)
  Echocardiogram 2D Echocardiogram has been performed.  Wayne [name] FRANCES 07/01/2012, 6:25 PM 

## 2012-07-01 NOTE — Progress Notes (Signed)
Advanced Heart Failure Rounding Note   Subjective:    Aspirin and Effient stopped due to hematoma in early May 2013. Admit with LLE calf absess. MSSA bacteremia. Reduced EF noted 15% (from 45%) this admit which was thought to be septic cardiomyopathy. HF medications stopped  May 13 by the patient for unknown reasons. He had been on carvedilol, lisinopril, lasix, spironolactone, and digoxin in the past.   Levophed and CVVH stopped 7/6.   S/p left leg wound irrigation and debridement with placement of wound VAC on 7/17  Cr improving 4.22 (peaked 5.65).  Hgb 6.1 today receiving 1 unit of pRBCs.  Feels pretty good today.  No c/o SOB/orthopnea/PND.  Wound VAC in place.    Objective:   Weight Range:  Vital Signs:   Temp:  [97.7 F (36.5 C)-98.3 F (36.8 C)] 98.1 F (36.7 C) (07/18 0744) Pulse Rate:  [96-116] 97  (07/18 0744) Resp:  [7-17] 14  (07/18 0744) BP: (97-129)/(48-66) 111/49 mmHg (07/18 0744) SpO2:  [93 %-100 %] 98 % (07/18 0744) FiO2 (%):  [0 %] 0 % (07/17 1544) Weight:  [234 lb 9.1 oz (106.4 kg)] 234 lb 9.1 oz (106.4 kg) (07/18 0427) Last BM Date: 06/30/12  Weight change: Filed Weights   06/29/12 0446 06/30/12 0433 07/01/12 0427  Weight: 234 lb 5.6 oz (106.3 kg) 236 lb 1.8 oz (107.1 kg) 234 lb 9.1 oz (106.4 kg)    Intake/Output:   Intake/Output Summary (Last 24 hours) at 07/01/12 1040 Last data filed at 07/01/12 0800  Gross per 24 hour  Intake 1642.5 ml  Output   1200 ml  Net  442.5 ml     Physical Exam:  General:  Chronically ill appearing. No resp difficulty HEENT: normal Neck: supple. JVP 7-8.  Carotids 2+ bilat; no bruits. No lymphadenopathy or thryomegaly appreciated. Cor: PMI nondisplaced. Regular rate & rhythm. No rubs, or murmurs. S3 Lungs: clear Abdomen: obese, soft, nontender, nondistended. No hepatosplenomegaly. No bruits or masses. Good bowel sounds x 4 Extremities: no cyanosis, clubbing, rash, LLE wound VAC in place.  RLE compression hose  Neuro:  alert & orientedx3, cranial nerves grossly intact. moves all 4 extremities w/o difficulty. Affect pleasant  Telemetry: ST 90-100  Labs: Basic Metabolic Panel:  Lab 07/01/12 1610 06/30/12 0500 06/29/12 1145 06/29/12 0419 06/28/12 0410 06/27/12 0500 06/26/12 0410 06/25/12 0415  NA 143 137 136 137 136 -- -- --  K 4.0 3.2* 3.9 3.0* 3.3* -- -- --  CL 103 96 96 95* 93* -- -- --  CO2 29 28 29 29 29  -- -- --  GLUCOSE 126* 176* 123* 83 143* -- -- --  BUN 93* 96* 97* 98* 98* -- -- --  CREATININE 4.22* 4.21* 4.67* 4.74* 5.11* -- -- --  CALCIUM 8.2* 8.2* 8.5 -- -- -- -- --  MG -- -- -- -- -- -- -- --  PHOS -- -- -- -- 5.9* 5.9* 6.9* 7.8*    Liver Function Tests:  Lab 06/28/12 0410 06/27/12 0500 06/26/12 0410 06/25/12 0415  AST 27 -- 14 --  ALT <5 -- <5 --  ALKPHOS 65 -- 67 --  BILITOT 0.2* -- 0.2* --  PROT 5.2* -- 5.0* --  ALBUMIN 1.8* 1.8* 1.6* 1.6*   No results found for this basename: LIPASE:5,AMYLASE:5 in the last 168 hours No results found for this basename: AMMONIA:3 in the last 168 hours  CBC:  Lab 07/01/12 0455 07/01/12 0410 06/29/12 1145 06/29/12 0419 06/28/12 0410 06/25/12 0415  WBC 11.4* 11.2*  12.0* 11.6* 8.8 --  NEUTROABS -- -- -- -- -- 7.2  HGB 6.1* 6.1* 8.9* 9.0* 8.6* --  HCT 19.0* 19.9* 27.2* 27.5* 26.2* --  MCV 88.8 89.6 84.7 84.6 83.4 --  PLT 180 201 299 317 331 --    Cardiac Enzymes: No results found for this basename: CKTOTAL:5,CKMB:5,CKMBINDEX:5,TROPONINI:5 in the last 168 hours  BNP: BNP (last 3 results)  Basename 06/14/12 1818 03/22/12 0912 10/14/11 1203  PROBNP 24481.0* 41.0 83.0     Other results:  Imaging: No results found.   Medications:     Scheduled Medications:    . allopurinol  200 mg Oral Daily  . calcium acetate  1,334 mg Oral TID WC  . carvedilol  3.125 mg Oral BID WC  . collagenase   Topical Daily  . darbepoetin (ARANESP) injection - NON-DIALYSIS  150 mcg Subcutaneous Q14 Days  . feeding supplement  237 mL Oral TID BM  .  heparin subcutaneous  5,000 Units Subcutaneous Q8H  . hydrocortisone cream   Topical BID  . HYDROmorphone      . HYDROmorphone      . HYDROmorphone      . insulin aspart  0-20 Units Subcutaneous Q4H  . insulin aspart  3 Units Subcutaneous TID WC  . insulin glargine  20 Units Subcutaneous QHS  . metoCLOPramide  5 mg Per Tube TID AC & HS  . pantoprazole  40 mg Oral Q1200  . polyethylene glycol  17 g Oral Once  . potassium chloride  20 mEq Oral Daily  . predniSONE  30 mg Oral Q breakfast  . ranitidine  150 mg Per Tube QHS  . silver sulfADIAZINE   Topical Daily  . sodium chloride      . vancomycin  1,000 mg Other To OR  . DISCONTD: feeding supplement (NEPRO CARB STEADY)  1,000 mL Per Tube QHS  . DISCONTD: feeding supplement  30 mL Per Tube BID  . DISCONTD: predniSONE  40 mg Oral Q breakfast  . DISCONTD: vancomycin  1,000 mg Intravenous Q12H  . DISCONTD: vancomycin  1,000 mg Intravenous Q12H    Infusions:    PRN Medications: sodium chloride, acetaminophen, fentaNYL, fentaNYL, HYDROmorphone (DILAUDID) injection, lidocaine, magnesium sulfate 1 - 4 g bolus IVPB, metoCLOPramide (REGLAN) injection, metoCLOPramide, ondansetron (ZOFRAN) IV, ondansetron, oxyCODONE, oxyCODONE-acetaminophen, silver nitrate applicators, simethicone, DISCONTD: gentamicin, DISCONTD:  HYDROmorphone (DILAUDID) injection, DISCONTD: ondansetron, DISCONTD: sodium chloride irrigation DISCONTD: vancomycin   Assessment:   1. Mixed cardiomyopathy, baseline ischemic CMP with suspicion of septic cardiomyopathy this admission.  2. CAD S/P multiple stents to RCA 3. Prostate Cancer 4. Cellulitis 5. Septic shock from leg source (deep abscess), MSSA      -s/p I&D with wound VAC placement 7/17 6. HTN 7. DM 8. OSA 9. NSTEMI: Demand ischemia versus ACS, not candidate for invasive evaluation at this time.  10. ARF: Septic shock with ATN 11. Anemia  12. Nausea, r/o illeus 13. Leukocytoclastic vasculitis.  Plan/Discussion:     Volume status remains stable.  BUN/Cr improving.  Will continue to follow.  Renal s/o yesterday, no HD needed at this time.  Anemia post I&D, has received 1 unit of packed RBCs.  If patient is to receive a second unit of packed RBCs would recommend IV lasix in between units.  Will need to reassess EF => will go ahead and do this with repeat echo.   Marca Ancona 07/01/2012 10:41 AM

## 2012-07-02 ENCOUNTER — Inpatient Hospital Stay
Admission: AD | Admit: 2012-07-02 | Discharge: 2012-08-05 | Disposition: A | Discharge status: Home / Self Care | Payer: Self-pay | Source: Ambulatory Visit | Attending: Internal Medicine | Admitting: Internal Medicine

## 2012-07-02 ENCOUNTER — Encounter (HOSPITAL_COMMUNITY): Payer: Self-pay | Admitting: Orthopedic Surgery

## 2012-07-02 DIAGNOSIS — L89109 Pressure ulcer of unspecified part of back, unspecified stage: Secondary | ICD-10-CM | POA: Diagnosis present

## 2012-07-02 DIAGNOSIS — E875 Hyperkalemia: Secondary | ICD-10-CM | POA: Diagnosis not present

## 2012-07-02 DIAGNOSIS — I10 Essential (primary) hypertension: Secondary | ICD-10-CM | POA: Diagnosis not present

## 2012-07-02 DIAGNOSIS — I509 Heart failure, unspecified: Secondary | ICD-10-CM | POA: Diagnosis present

## 2012-07-02 DIAGNOSIS — S81009A Unspecified open wound, unspecified knee, initial encounter: Secondary | ICD-10-CM | POA: Diagnosis not present

## 2012-07-02 DIAGNOSIS — L0390 Cellulitis, unspecified: Secondary | ICD-10-CM

## 2012-07-02 DIAGNOSIS — L988 Other specified disorders of the skin and subcutaneous tissue: Secondary | ICD-10-CM | POA: Diagnosis not present

## 2012-07-02 DIAGNOSIS — J9819 Other pulmonary collapse: Secondary | ICD-10-CM | POA: Diagnosis not present

## 2012-07-02 DIAGNOSIS — G8929 Other chronic pain: Secondary | ICD-10-CM | POA: Diagnosis not present

## 2012-07-02 DIAGNOSIS — I2510 Atherosclerotic heart disease of native coronary artery without angina pectoris: Secondary | ICD-10-CM | POA: Diagnosis not present

## 2012-07-02 DIAGNOSIS — R7881 Bacteremia: Secondary | ICD-10-CM | POA: Diagnosis present

## 2012-07-02 DIAGNOSIS — Z6833 Body mass index (BMI) 33.0-33.9, adult: Secondary | ICD-10-CM | POA: Diagnosis not present

## 2012-07-02 DIAGNOSIS — E46 Unspecified protein-calorie malnutrition: Secondary | ICD-10-CM | POA: Diagnosis not present

## 2012-07-02 DIAGNOSIS — R0602 Shortness of breath: Secondary | ICD-10-CM | POA: Diagnosis not present

## 2012-07-02 DIAGNOSIS — R5381 Other malaise: Secondary | ICD-10-CM | POA: Diagnosis present

## 2012-07-02 DIAGNOSIS — M7989 Other specified soft tissue disorders: Secondary | ICD-10-CM

## 2012-07-02 DIAGNOSIS — E876 Hypokalemia: Secondary | ICD-10-CM | POA: Diagnosis not present

## 2012-07-02 DIAGNOSIS — E441 Mild protein-calorie malnutrition: Secondary | ICD-10-CM | POA: Diagnosis present

## 2012-07-02 DIAGNOSIS — G4733 Obstructive sleep apnea (adult) (pediatric): Secondary | ICD-10-CM | POA: Diagnosis present

## 2012-07-02 DIAGNOSIS — Z794 Long term (current) use of insulin: Secondary | ICD-10-CM | POA: Diagnosis not present

## 2012-07-02 DIAGNOSIS — L8993 Pressure ulcer of unspecified site, stage 3: Secondary | ICD-10-CM | POA: Diagnosis present

## 2012-07-02 DIAGNOSIS — Z09 Encounter for follow-up examination after completed treatment for conditions other than malignant neoplasm: Secondary | ICD-10-CM | POA: Diagnosis not present

## 2012-07-02 DIAGNOSIS — D6489 Other specified anemias: Secondary | ICD-10-CM | POA: Diagnosis not present

## 2012-07-02 DIAGNOSIS — A4901 Methicillin susceptible Staphylococcus aureus infection, unspecified site: Secondary | ICD-10-CM | POA: Diagnosis present

## 2012-07-02 DIAGNOSIS — A419 Sepsis, unspecified organism: Secondary | ICD-10-CM | POA: Diagnosis not present

## 2012-07-02 DIAGNOSIS — T79A29A Traumatic compartment syndrome of unspecified lower extremity, initial encounter: Secondary | ICD-10-CM | POA: Diagnosis not present

## 2012-07-02 DIAGNOSIS — G894 Chronic pain syndrome: Secondary | ICD-10-CM | POA: Diagnosis present

## 2012-07-02 DIAGNOSIS — S31809A Unspecified open wound of unspecified buttock, initial encounter: Secondary | ICD-10-CM | POA: Diagnosis not present

## 2012-07-02 DIAGNOSIS — S81809A Unspecified open wound, unspecified lower leg, initial encounter: Secondary | ICD-10-CM | POA: Diagnosis not present

## 2012-07-02 DIAGNOSIS — L02419 Cutaneous abscess of limb, unspecified: Secondary | ICD-10-CM | POA: Diagnosis not present

## 2012-07-02 DIAGNOSIS — G8928 Other chronic postprocedural pain: Secondary | ICD-10-CM | POA: Diagnosis not present

## 2012-07-02 DIAGNOSIS — D638 Anemia in other chronic diseases classified elsewhere: Secondary | ICD-10-CM | POA: Diagnosis present

## 2012-07-02 DIAGNOSIS — IMO0001 Reserved for inherently not codable concepts without codable children: Secondary | ICD-10-CM | POA: Diagnosis not present

## 2012-07-02 DIAGNOSIS — T8131XA Disruption of external operation (surgical) wound, not elsewhere classified, initial encounter: Secondary | ICD-10-CM | POA: Diagnosis not present

## 2012-07-02 DIAGNOSIS — B965 Pseudomonas (aeruginosa) (mallei) (pseudomallei) as the cause of diseases classified elsewhere: Secondary | ICD-10-CM | POA: Diagnosis not present

## 2012-07-02 DIAGNOSIS — R52 Pain, unspecified: Secondary | ICD-10-CM | POA: Diagnosis not present

## 2012-07-02 DIAGNOSIS — G8918 Other acute postprocedural pain: Secondary | ICD-10-CM | POA: Diagnosis not present

## 2012-07-02 DIAGNOSIS — E669 Obesity, unspecified: Secondary | ICD-10-CM

## 2012-07-02 DIAGNOSIS — A4101 Sepsis due to Methicillin susceptible Staphylococcus aureus: Secondary | ICD-10-CM | POA: Diagnosis not present

## 2012-07-02 DIAGNOSIS — M310 Hypersensitivity angiitis: Secondary | ICD-10-CM | POA: Diagnosis not present

## 2012-07-02 DIAGNOSIS — E43 Unspecified severe protein-calorie malnutrition: Secondary | ICD-10-CM | POA: Diagnosis not present

## 2012-07-02 DIAGNOSIS — M6281 Muscle weakness (generalized): Secondary | ICD-10-CM | POA: Diagnosis not present

## 2012-07-02 DIAGNOSIS — R609 Edema, unspecified: Secondary | ICD-10-CM | POA: Diagnosis not present

## 2012-07-02 DIAGNOSIS — D62 Acute posthemorrhagic anemia: Secondary | ICD-10-CM | POA: Diagnosis not present

## 2012-07-02 DIAGNOSIS — R5383 Other fatigue: Secondary | ICD-10-CM | POA: Diagnosis present

## 2012-07-02 LAB — GLUCOSE, CAPILLARY
Glucose-Capillary: 130 mg/dL — ABNORMAL HIGH (ref 70–99)
Glucose-Capillary: 80 mg/dL (ref 70–99)
Glucose-Capillary: 103 mg/dL — ABNORMAL HIGH (ref 70–99)

## 2012-07-02 LAB — CBC
HCT: 23.9 % — ABNORMAL LOW (ref 39.0–52.0)
MCH: 28.2 pg (ref 26.0–34.0)
MCHC: 31.8 g/dL (ref 30.0–36.0)
MCV: 87.3 fL (ref 78.0–100.0)
Platelets: 166 10*3/uL (ref 150–400)
RBC: 2.52 MIL/uL — ABNORMAL LOW (ref 4.22–5.81)
RDW: 18.5 % — ABNORMAL HIGH (ref 11.5–15.5)

## 2012-07-02 LAB — TYPE AND SCREEN: Unit division: 0

## 2012-07-02 LAB — BASIC METABOLIC PANEL
Calcium: 8.7 mg/dL (ref 8.4–10.5)
GFR calc non Af Amer: 14 mL/min — ABNORMAL LOW (ref >90)
Sodium: 139 mEq/L (ref 135–145)

## 2012-07-02 LAB — PROTIME-INR
INR: 1.02 (ref 0.00–1.49)
Prothrombin Time: 13.6 seconds (ref 11.6–15.2)

## 2012-07-02 MED ORDER — HEPARIN SODIUM (PORCINE) 5000 UNIT/ML IJ SOLN: 5000.0000 [IU] | Freq: Three times a day (TID) | INTRAMUSCULAR | Status: DC

## 2012-07-02 MED ORDER — FENTANYL CITRATE 0.05 MG/ML IJ SOLN: 100.0000 ug | Freq: Every day | INTRAMUSCULAR | Status: DC | PRN

## 2012-07-02 MED ORDER — PANTOPRAZOLE SODIUM 40 MG PO TBEC: 40.0000 mg | DELAYED_RELEASE_TABLET | Freq: Every day | ORAL | Status: DC

## 2012-07-02 MED ORDER — SIMETHICONE 40 MG/0.6ML PO SUSP: 40.0000 mg | Freq: Four times a day (QID) | ORAL | Status: DC | PRN

## 2012-07-02 MED ORDER — FAMOTIDINE 20 MG PO TABS
20.0000 mg | ORAL_TABLET | Freq: Every day | ORAL | Status: DC
  Filled 2012-07-02: qty 1

## 2012-07-02 MED ORDER — SALINE SPRAY 0.65 % NA SOLN: 1.0000 | NASAL | Status: DC | PRN

## 2012-07-02 MED ORDER — METOCLOPRAMIDE HCL 5 MG/ML IJ SOLN: 5.0000 mg | Freq: Three times a day (TID) | INTRAMUSCULAR | Status: DC | PRN

## 2012-07-02 MED ORDER — CARVEDILOL 6.25 MG PO TABS: 6.2500 mg | ORAL_TABLET | Freq: Two times a day (BID) | ORAL | Status: DC

## 2012-07-02 MED ORDER — OXYCODONE HCL 5 MG PO TABS: 5.0000 mg | ORAL_TABLET | ORAL | Status: AC | PRN

## 2012-07-02 MED ORDER — ALLOPURINOL 100 MG PO TABS: 200.0000 mg | ORAL_TABLET | Freq: Every day | ORAL | Status: DC

## 2012-07-02 MED ORDER — CALCIUM ACETATE 667 MG PO CAPS: 1334.0000 mg | ORAL_CAPSULE | Freq: Three times a day (TID) | ORAL | Status: DC

## 2012-07-02 MED ORDER — FAMOTIDINE 20 MG PO TABS: 20.0000 mg | ORAL_TABLET | Freq: Every day | ORAL | Status: DC

## 2012-07-02 MED ORDER — HYDROMORPHONE HCL PF 1 MG/ML IJ SOLN: 0.5000 mg | INTRAMUSCULAR | Status: DC | PRN

## 2012-07-02 MED ORDER — INSULIN GLARGINE 100 UNIT/ML ~~LOC~~ SOLN: 20.0000 [IU] | Freq: Every day | SUBCUTANEOUS | Status: DC

## 2012-07-02 MED ORDER — HYDROCORTISONE 1 % EX CREA: TOPICAL_CREAM | Freq: Two times a day (BID) | CUTANEOUS | Status: DC

## 2012-07-02 MED ORDER — INSULIN ASPART 100 UNIT/ML ~~LOC~~ SOLN: 0.0000 [IU] | SUBCUTANEOUS | Status: DC

## 2012-07-02 MED ORDER — METOCLOPRAMIDE HCL 5 MG PO TABS: 5.0000 mg | ORAL_TABLET | Freq: Three times a day (TID) | ORAL | Status: DC

## 2012-07-02 MED ORDER — ONDANSETRON HCL 4 MG/2ML IJ SOLN: 4.0000 mg | Freq: Four times a day (QID) | INTRAMUSCULAR | Status: DC | PRN

## 2012-07-02 MED ORDER — FENTANYL CITRATE 0.05 MG/ML IJ SOLN: 25.0000 ug | INTRAMUSCULAR | Status: DC | PRN

## 2012-07-02 MED ORDER — PREDNISONE 10 MG PO TABS: 30.0000 mg | ORAL_TABLET | Freq: Every day | ORAL | Status: AC

## 2012-07-02 MED ORDER — DARBEPOETIN ALFA-POLYSORBATE 150 MCG/0.3ML IJ SOLN
150.0000 ug | INTRAMUSCULAR | Status: DC
Start: 2012-07-02 — End: 2013-01-11

## 2012-07-02 MED ORDER — INSULIN ASPART 100 UNIT/ML ~~LOC~~ SOLN: 3.0000 [IU] | Freq: Three times a day (TID) | SUBCUTANEOUS | Status: DC

## 2012-07-02 MED ORDER — SODIUM CHLORIDE 0.9 % IV SOLN: 250.0000 mL | INTRAVENOUS | Status: DC | PRN

## 2012-07-02 MED ORDER — POTASSIUM CHLORIDE CRYS ER 20 MEQ PO TBCR: 20.0000 meq | EXTENDED_RELEASE_TABLET | Freq: Every day | ORAL | Status: DC

## 2012-07-02 MED ORDER — METOCLOPRAMIDE HCL 5 MG PO TABS
5.0000 mg | ORAL_TABLET | Freq: Three times a day (TID) | ORAL | Status: DC
  Administered 2012-07-02: 5 mg via ORAL
  Filled 2012-07-02 (×4): qty 1

## 2012-07-02 MED ORDER — ASPIRIN 81 MG PO CHEW: 81.0000 mg | CHEWABLE_TABLET | Freq: Every day | ORAL | Status: DC

## 2012-07-02 MED ORDER — ENSURE COMPLETE PO LIQD: 237.0000 mL | Freq: Three times a day (TID) | ORAL | Status: DC

## 2012-07-02 MED ORDER — ACETAMINOPHEN 325 MG PO TABS: 650.0000 mg | ORAL_TABLET | ORAL | Status: DC | PRN

## 2012-07-02 MED ORDER — POLYETHYLENE GLYCOL 3350 17 G PO PACK: 17.0000 g | PACK | Freq: Once | ORAL | Status: AC

## 2012-07-02 MED ORDER — SALINE SPRAY 0.65 % NA SOLN
1.0000 | NASAL | Status: DC | PRN
  Filled 2012-07-02: qty 44

## 2012-07-02 MED ORDER — POTASSIUM CHLORIDE CRYS ER 10 MEQ PO TBCR: 20.0000 meq | EXTENDED_RELEASE_TABLET | Freq: Every day | ORAL | Status: DC

## 2012-07-02 NOTE — Progress Notes (Signed)
Thank you to Dr Jacky Kindle for this referral via the long length of stay meeting.  Chart review complete.  Provided overview to patient and his wife at bedside.  Patient is pending transfer to [facility].  I will follow up to obtain consents during his course of care there.  Both agreed that patient could benefit from disease management support services that continue beyond what home health traditionally provides.

## 2012-07-02 NOTE — Progress Notes (Addendum)
Nutrition Follow-up Note  Intervention:   Increase Ensure Complete to QID to maximize oral intake.  Recommend d/c NG tube and TF.  Diet: CHO-modified medium Supplements: Ensure Complete TID  Calorie Count results for 7/18:  Breakfast: 434 calories, 27 grams protein Lunch: 275 calories, 16 grams protein Dinner: 365 calories, 18 grams protein Supplements: 1050 calories, 39 grams protein  Total intake: 2124  kcal (92% of minimum estimated needs)  100 grams protein (77% of minimum estimated needs)  Estimated needs: 2300-2500 kcals, 130-150 grams protein daily  Nutrition Dx: Inadequate oral intake, improving.  Intake of protein is still slightly inadequate.    TF on hold, but NG tube/panda is still in place.  Patient reports that he is eating a lot better.  Is willing to drink Ensure 3-4 times daily to help meet nutrition needs.  Noted wound is healing and patient no longer requiring hydrotherapy.  Pateint continues to have increased protein and calorie needs to support healing.  Goal: Intake to meet >90% of estimated nutrition needs to support healing, unmet, but progressing.  Monitor:  Weight trend, labs, PO intake.   Joaquin Courts, RD, CNSC, LDN Pager# [phone] After Hours Pager# [phone]

## 2012-07-02 NOTE — Discharge Summary (Signed)
Physician Discharge Summary  Patient ID: Wayne [name] MRN: [id number] DOB/AGE: 1941-09-12 71 y.o.  Admit date: 06/14/2012 Discharge date: 07/02/2012    Discharge Diagnoses:  Principal Problem:  *Staphylococcus aureus bacteremia with sepsis Active Problems:  Secondary DM with peripheral vascular disease, uncontrolled  HYPERTROPHY PROSTATE W/UR OBST & OTH LUTS  CORONARY ATHEROSCLEROSIS NATIVE CORONARY ARTERY  Polymyalgia rheumatica  Systolic CHF, chronic  OSA (obstructive sleep apnea)  Obesity  Cellulitis and abscess of lower leg  Healthcare-associated pneumonia  Hyperglycemia  Acute encephalopathy  NSTEMI, initial episode of care  AKI (acute kidney injury)  Protein-calorie malnutrition, severe  Leucocytoclastic vasculitis    Brief Summary: Wayne [name] is a 71 y.o. y/o male with a PMH of CAD s/p stents, EF 45% in 01/2012, PMR, Prostate cancer.  Noted 3 month hx of chronic redness and swelling and drainage from his left lower leg and is being followed by Dr. Lajoyce [name] as an outpatient.  He walked with crutches prior to admit.  06/12/12 he noticed increasing redness of LLE cellulitis spreading both proximally into the knee area and distally to ankle area. Then 06/13/12, he had a shaking and chills. 06/14/2012 he developed a fever to 102 and became confused prompting ER visit. ER evaluation demonstrated hypotension requiring  3-4 Liters fluid to normalized BP, metabolic acidosis, labored respirations and confusion which improved with fluids.  Admitted to ICU and decompensated requiring intubation.  Hospital course complicated by septic shock, prolonged respiratory failure, acute renal failure requiring CVVHD, multiple LLE wound debridements and malnutrition.                                                                       Hospital Summary by Discharge Diagnosis   Staphylococcus aureus bacteremia with sepsis / Septic Shock / Cellulitis of LLE Wayne [name] was initially admitted on 7/1 with  refractory shock, acidosis, acute renal failure secondary to known left lower extremity cellulitis and left lower lobe pulmonary infiltrate. on 7/1 with  refractory shock, acidosis, acute renal failure secondary to known left lower extremity cellulitis and left lower lobe pulmonary infiltrate. On the evening of 7/1 he decompensated requiring intubation/mechanical ventilation, EGDT, and vasopressors.  06/15/2012 he underwent debridement of left lower extremity with irrigation and 900 cc of urge amount purulent drainage. He remained in the intensive care unit on vasopressors and CVVHD until 7/6 and on 7/8was transferred to step down.   Blood cultures were positive for MSSA.  ID was consulted for antibiotic assistance after he developed a rash.  (See below) .On 06/30/2012 he further underwent irrigation and debridement of the left lower extremity placement of wound VAC per Dr. Lajoyce [name].  At this time it is recommended he continue to have wound therapy follow to manage VAC needs.  No further hydrotherapy at this time.   Plan: -Anticipate that he will need a VAC change on Monday 7/22.  -Will need Vancomycin dosed per pharmacy --goal trough level 15-20 mcg/ml -Continue to hold Vancomycin doses -check vanc trough am 7/20   NSTEMI / Systolic CHF / Hx of CAD / HTN Prior to admission Wayne [name] had a known history of systolic CHF with ejection fraction of 45%. On admission he was noted to have elevated enzymes consistent with non-ST elevation MI in the setting of sepsis/shock. Prior to admission he was on aspirin and [place] and however these were stopped in early  May 2013 due to hematoma. Wayne [name] apparently also stopped his heart failure medicines for unknown reasons prior to admission. He previously was on on carvedilol, lisinopril, lasix, spironolactone, and digoxin.  On admission his ejection fraction was reduced at 15% which was thought to be septic cardiomyopathy.  Followup echo on 7/19 demonstrates an ejection fraction of 30%.  Plan: -Discussed use of aspirin with orthopedic PA and will resume low-dose aspirin -Dr. Shirlee [name] will [facility]up with [facility].  Further cardiac workup to be determined [place] [facility]. -Increase Coreg to 6.25 twice a day per recommendations of Dr. Shirlee [name]  Acute Renal Failure / Anemia Acute renal failure secondary to septic shock.  He required CVVHD during intensive care stay which was stopped on 7/6. Currently his creatinine has stabilized and is gradually trending down with good urinary output. Nephrology has signed off.  Acute Respiratory Failure / HCAP / OSA Acute respiratory failure secondary to metabolic acidosis with left lower lobe infiltrate, and ARDS in the setting of severe sepsis. He was extubated on 7/5 and respiratory failure has resolved.   DM with peripheral vascular disease He was maintained on sliding scale insulin during hospitalization is recommended to continue upon transfer. Prior to admit he was on metformin.    Plan: -will need to reduce lantus as prednisone is reduced.   Leucocytoclastic vasculitis / Hx Polymyalgia rheumatica Wayne [name] developed a rash which initially was thought related to antibiotics.  Dermatology was consulted and skin biopsy was obtained which confirmed a leukocytoclastic vasculitis. He was placed on IV steroids and transitioned to by mouth prednisone. Currently rash is significantly improved.  Plan: -Recommend slow taper of prednisone to off over 2 weeks.   Acute Encephalopathy In the setting of metabolic encephalopathy it associated with sepsis. Mental status has resolved.   Severe Protein Calorie Malnutrition / Ileus / Nausea & Vomiting Post extubation Wayne [name] had episodes of nausea and vomiting suspicious for ileus which has resolved. 7/15 albumin 1.5.  Initially he required nocturnal feedings and was transitioned to by mouth feedings. Below his most recent calorie count. He also has been supplementing his diet with Ensure.  [name] will be discontinued on 7/19 prior to discharge it is recommended that he continue to be observed with  calorie count and push adequate nutrition for wound healing.  If he is unable to adequately support his nutritional status with by mouth feedings it is recommended to consider reinsertion of Panda for nocturnal supplementation.   Calorie Count results for 7/18:  Breakfast: 434 calories, 27 grams protein  Lunch: 275 calories, 16 grams protein  Dinner: 365 calories, 18 grams protein  Supplements: 1050 calories, 39 grams protein  Total intake:  2124 kcal (92% of minimum estimated needs)  100 grams protein (77% of minimum estimated needs)  Estimated needs: 2300-2500 kcals, 130-150 grams protein daily     Consults: Infectious Disease - Dr. Orvan Falconer Orthopedics - Dr. Lajoyce [name] Cardiology - Dr. Shirlee [name] Dermatology - Dr. Terri Piedra   Lines/tubes: ETT 7/1>>7/5  NG Tube 7/1 >>7/5  Foley 7/1>>> 7/18  RIJ 7/1>>>dc  R Radial 7/1>>>7/4  RPIV 7/1>>>7/4  LPIV 7/1>>>  Premrose Drains7/2>>>7/5  L IJ HD catheter 7/4>> 7/18  ABX:  Vanc 7/1 >>> 7/2  Zosyn 7/1 >>> 7/2  clinda7/2>>>7/3  Linezolid 7/2>>>7/5  Imipenim 7/2>>>7/5  Cefazolin ( MSSA cellulitis/abscess LLE per ID) 7/5 >>>HELD 7/10 d/t rash>>7/10  7/10 Vanco (MSSA, allergic to ceph, per ID)>>  Microbiology/Sepsis markers: 7/1 BCX: >>>  SA pansens  7/1 BCX:>>> SA  7/1 UCX >>> No growth  7/2 Wound CX: >>> MSSA   Events Since Admission:  7/2- refractory shock post op, acidosis, arf, epi required  7/2 irrigation and debridement of LLE  7/2 2 units PRBC  7/3- Improved pressors  7/5- Off all pressors  7/6 2 units PRBC  7/8>>> SDU  7/13> No overnight issues  7/14> stable, seen by Iran Ouch, Deterding, Terri Piedra...  7/17> back to OR for LLE debridement 7/18 - Hgb drifted to 6.1   Significant Diagnostic Studies:  7/3 Renal Ultrasound >>>No hydronephrosis. The urinary bladder is decompressed with Foley catheter  7/18 ECHO>>>Left ventricle: The cavity size was moderately dilated.Wall thickness was normal. The estimated ejection  fraction was 30%. Diffuse hypokinesis. - Aortic valve: There was mild stenosis. - Mitral valve: Mild regurgitation. - Left atrium: The atrium was mildly dilated. - Atrial septum: No defect or patent foramen ovale was identified.    Discharge Exam: General: NAD, interacting, calm  HEENT: NCAT, EOMi, HD cath in place  PULM: CTA B  CV: RRR, no mgr  AB: BS hypoactive , soft, nontender  Ext: dry dressings, tender LLE to palp  Neuro: A&Ox4  Skin: diffuse vesicular hemorrhagic/ erythematous rash > vesicles starting to dry some   Filed Vitals:   07/01/12 2340 07/02/12 0357 07/02/12 0817 07/02/12 1141  BP: 107/52 117/71 137/70 136/73  Pulse: 82 96 81 103  Temp: 97.6 F (36.4 C) 97.8 F (36.6 C) 97.3 F (36.3 C) 97.9 F (36.6 C)  TempSrc: Oral Oral Oral Oral  Resp: 11 22  15   Height:      Weight:  233 lb 14.5 oz (106.1 kg)    SpO2: 98% 97% 97% 98%     Discharge Labs  BMET  Lab 07/02/12 0423 07/01/12 0410 06/30/12 0500 06/29/12 1145 06/29/12 0419 06/28/12 0410 06/27/12 0500 06/26/12 0410  NA 139 143 137 136 137 -- -- --  K 4.3 4.0 -- -- -- -- -- --  CL 99 103 96 96 95* -- -- --  CO2 29 29 28 29 29  -- -- --  GLUCOSE 115* 126* 176* 123* 83 -- -- --  BUN 85* 93* 96* 97* 98* -- -- --  CREATININE 3.92* 4.22* 4.21* 4.67* 4.74* -- -- --  CALCIUM 8.7 8.2* 8.2* 8.5 8.5 -- -- --  MG -- -- -- -- -- -- -- --  PHOS -- -- -- -- -- 5.9* 5.9* 6.9*     CBC  Lab 07/02/12 0423 07/02/12 0008 07/01/12 1829  HGB 7.6* 7.1* 6.1*  HCT 23.9* 22.0* 18.7*  WBC 8.0 8.9 9.2  PLT 179 166 156   Anti-Coagulation  Lab 07/02/12 0423 07/01/12 0410 06/30/12 0500  INR 1.02 1.21 1.13   Discharge Orders    Future Orders Please Complete By Expires   Diet Carb Modified      Comments:   With TID Ensure   Increase activity slowly      Discharge wound care:      Comments:   Wound care per instructions of Dr. Lajoyce [name] and WOC Team       Medication List  As of 07/02/2012  1:37 PM   START taking  these medications         acetaminophen 325 MG tablet   Commonly known as: TYLENOL   Take 2 tablets (650 mg total) by mouth every 4 (four) hours as needed.      allopurinol 100 MG tablet  Commonly known as: ZYLOPRIM   Take 2 tablets (200 mg total) by mouth daily.      calcium acetate 667 MG capsule   Commonly known as: PHOSLO   Take 2 capsules (1,334 mg total) by mouth 3 (three) times daily with meals.      carvedilol 6.25 MG tablet   Commonly known as: COREG   Take 1 tablet (6.25 mg total) by mouth 2 (two) times daily with a meal.      darbepoetin 150 MCG/0.3ML Soln   Commonly known as: ARANESP   Inject 0.3 mLs (150 mcg total) into the skin every 14 (fourteen) days.      famotidine 20 MG tablet   Commonly known as: PEPCID   Take 1 tablet (20 mg total) by mouth at bedtime.      feeding supplement Liqd   Take 237 mLs by mouth 3 (three) times daily between meals.      * fentaNYL 0.05 MG/ML injection   Commonly known as: SUBLIMAZE   Inject 2 mLs (100 mcg total) into the vein daily as needed for severe pain (prior to LLE dressing change).      * fentaNYL 0.05 MG/ML injection   Commonly known as: SUBLIMAZE   Inject 0.5-1 mLs (25-50 mcg total) into the vein every 2 (two) hours as needed for severe pain.      heparin 5000 UNIT/ML injection   Inject 1 mL (5,000 Units total) into the skin every 8 (eight) hours.      hydrocortisone cream 1 %   Apply topically 2 (two) times daily.      HYDROmorphone 1 MG/ML Soln injection   Commonly known as: DILAUDID   Inject 0.5-1 mLs (0.5-1 mg total) into the vein every 2 (two) hours as needed for severe pain.      * insulin aspart 100 UNIT/ML injection   Commonly known as: novoLOG   Inject 0-20 Units into the skin every 4 (four) hours.      * insulin aspart 100 UNIT/ML injection   Commonly known as: novoLOG   Inject 3 Units into the skin 3 (three) times daily with meals.      insulin glargine 100 UNIT/ML injection   Commonly known  as: LANTUS   Inject 20 Units into the skin at bedtime.      * metoCLOPramide 5 MG tablet   Commonly known as: REGLAN   Take 1 tablet (5 mg total) by mouth 4 (four) times daily -  before meals and at bedtime.      * metoCLOPramide 5 MG/ML injection   Commonly known as: REGLAN   Inject 1-2 mLs (5-10 mg total) into the vein every 8 (eight) hours as needed (if ondansetron (ZOFRAN) ineffective.).      ondansetron 4 MG/2ML Soln injection   Commonly known as: ZOFRAN   Inject 2 mLs (4 mg total) into the vein every 6 (six) hours as needed for nausea.      oxyCODONE 5 MG immediate release tablet   Commonly known as: Oxy IR/ROXICODONE   Take 1 tablet (5 mg total) by mouth every 4 (four) hours as needed.      pantoprazole 40 MG tablet   Commonly known as: PROTONIX   Take 1 tablet (40 mg total) by mouth daily at 12 noon.      polyethylene glycol packet   Commonly known as: MIRALAX / GLYCOLAX   Take 17 g by mouth once.      potassium chloride SA 20  MEQ tablet   Commonly known as: K-DUR,KLOR-CON   Take 1 tablet (20 mEq total) by mouth daily.      predniSONE 10 MG tablet   Commonly known as: DELTASONE   Take 3 tablets (30 mg total) by mouth daily with breakfast.      simethicone 40 MG/0.6ML drops   Commonly known as: MYLICON   Take 0.6 mLs (40 mg total) by mouth 4 (four) times daily as needed.      sodium chloride 0.65 % Soln nasal spray   Commonly known as: OCEAN   Place 1 spray into the nose as needed for congestion.      sodium chloride 0.9 % infusion   Inject 250 mLs into the vein as needed (if IV carrier fluid needed.).     * Notice: This list has 6 medication(s) that are the same as other medications prescribed for you. Read the directions carefully, and ask your doctor or other care provider to review them with you.       CONTINUE taking these medications         aspirin EC 81 MG tablet         STOP taking these medications         glimepiride 4 MG tablet       HYDROcodone-acetaminophen 5-500 MG per tablet      metFORMIN 500 MG tablet      nitroGLYCERIN 0.4 MG SL tablet      prasugrel 10 MG Tabs          Where to get your medications       Information on where to get these meds is not yet available. Ask your nurse or doctor.         acetaminophen 325 MG tablet   allopurinol 100 MG tablet   calcium acetate 667 MG capsule   carvedilol 6.25 MG tablet   darbepoetin 150 MCG/0.3ML Soln   famotidine 20 MG tablet   feeding supplement Liqd   fentaNYL 0.05 MG/ML injection   heparin 5000 UNIT/ML injection   hydrocortisone cream 1 %   HYDROmorphone 1 MG/ML Soln injection   insulin aspart 100 UNIT/ML injection   insulin glargine 100 UNIT/ML injection   metoCLOPramide 5 MG tablet   metoCLOPramide 5 MG/ML injection   ondansetron 4 MG/2ML Soln injection   oxyCODONE 5 MG immediate release tablet   pantoprazole 40 MG tablet   polyethylene glycol packet   potassium chloride SA 20 MEQ tablet   predniSONE 10 MG tablet   simethicone 40 MG/0.6ML drops   sodium chloride 0.65 % Soln nasal spray   sodium chloride 0.9 % infusion              Disposition:  To [facility]   Discharged Condition: JAMIAN ANDUJO has met maximum benefit of inpatient care and is medically stable and cleared for [facility]hab efforts. .  Patient is pending follow up as above.      Time spent on disposition:  Greater than 45 minutes.   Signed: Canary Brim, NP-C [facility] Pgr: [phone]  Independently examined pt, evaluated data & formulated above discharge care plan with NP & after discussing with wife.  ALVA,RAKESH V.

## 2012-07-02 NOTE — Progress Notes (Signed)
Subjective: 2 Days Post-Op Procedure(s) (LRB): IRRIGATION AND DEBRIDEMENT EXTREMITY (Left) Patient reports pain as moderate.   Anxious about having VAC removed and ask for anesthesia.  Nurse gave IV narcotic.   Objective: Vital signs in last 24 hours: Temp:  [97.3 F (36.3 C)-98.5 F (36.9 C)] 97.3 F (36.3 C) (07/19 0817) Pulse Rate:  [81-97] 81  (07/19 0817) Resp:  [11-22] 22  (07/19 0357) BP: (94-137)/(47-71) 137/70 mmHg (07/19 0817) SpO2:  [97 %-99 %] 97 % (07/19 0817) Weight:  [106.1 kg (233 lb 14.5 oz)] 106.1 kg (233 lb 14.5 oz) (07/19 0357)  Intake/Output from previous day: 07/18 0701 - 07/19 0700 In: 939.5 [P.O.:437; I.V.:100; Blood:362.5; NG/GT:40] Out: 1955 [Urine:1875; Drains:80] Intake/Output this shift: Total I/O In: -  Out: 235 [Urine:225; Drains:10]   Basename 07/02/12 0423 07/02/12 0008 07/01/12 1829 07/01/12 0455 07/01/12 0410  HGB 7.6* 7.1* 6.1* 6.1* 6.1*    Basename 07/02/12 0423 07/02/12 0008  WBC 8.0 8.9  RBC 2.74* 2.52*  HCT 23.9* 22.0*  PLT 179 166    Basename 07/02/12 0423 07/01/12 0410  NA 139 143  K 4.3 4.0  CL 99 103  CO2 29 29  BUN 85* 93*  CREATININE 3.92* 4.22*  GLUCOSE 115* 126*  CALCIUM 8.7 8.2*    Basename 07/02/12 0423 07/01/12 0410  LABPT -- --  INR 1.02 1.21    Incision: VAC removed and wound of medial calf with beefy red granulation tissue without exudate.  copious serosan. drainage from proximal wound at back of knee.  sutures intact.  tissues soft with very little edema  Assessment/Plan: 2 Days Post-Op Procedure(s) (LRB): IRRIGATION AND DEBRIDEMENT EXTREMITY (Left) Up with therapy after VAC replaced today. Wound care consult to help manage VAC. No hydrotherapy needed at this time.   Anticipate VAC exchange again on Monday.  Quincie Haroon M 07/02/2012, 10:00 AM

## 2012-07-02 NOTE — Progress Notes (Signed)
Clinical Social Work  Per chart review, patient is being followed by LTAC and CIR and has the option at either facility. Patient previously reported that he would prefer these options over SNF. CSW is signing off but available if needed.  [place], [place] [phone]

## 2012-07-02 NOTE — Progress Notes (Signed)
PT Cancellation Note  Treatment cancelled today due to patient awaiting VAC placement and transfer to [facility].  Wayne [name] M 07/02/2012, 2:35 PM  07/02/2012 Cephus Shelling, PT, DPT [phone]

## 2012-07-02 NOTE — Progress Notes (Signed)
Hydrotherapy Note: Spoke with RN, who contacted MD.  Pt does not require any further hydrotherapy and RN to change wound VAC.  Hydrotherapy signing off.  Please reorder if needed.  Thanks.  07/02/2012 Cephus Shelling, PT, DPT [phone]

## 2012-07-02 NOTE — Progress Notes (Signed)
Wound vac removed by PA, gauze and ace dsg applied. Wound consult in place and will be by to redsg. Will continue to monitor for further changes.

## 2012-07-02 NOTE — Progress Notes (Signed)
Report called to [facility]. Discharge orders in place. VSS. Wife at bedside. Forms signed.

## 2012-07-03 LAB — PROTIME-INR: INR: 1.02 (ref 0.00–1.49)

## 2012-07-03 LAB — MAGNESIUM: Magnesium: 1.5 mg/dL (ref 1.5–2.5)

## 2012-07-03 LAB — CBC WITH DIFFERENTIAL/PLATELET
Basophils Absolute: 0 10*3/uL (ref 0.0–0.1)
Basophils Relative: 0 % (ref 0–1)
Eosinophils Absolute: 0 10*3/uL (ref 0.0–0.7)
MCH: 27.9 pg (ref 26.0–34.0)
MCHC: 31.6 g/dL (ref 30.0–36.0)
Neutro Abs: 5.4 10*3/uL (ref 1.7–7.7)
Neutrophils Relative %: 82 % — ABNORMAL HIGH (ref 43–77)
Platelets: 144 10*3/uL — ABNORMAL LOW (ref 150–400)
RDW: 19.1 % — ABNORMAL HIGH (ref 11.5–15.5)

## 2012-07-03 LAB — RENAL FUNCTION PANEL
BUN: 78 mg/dL — ABNORMAL HIGH (ref 6–23)
CO2: 29 mEq/L (ref 19–32)
Calcium: 8.9 mg/dL (ref 8.4–10.5)
Chloride: 98 mEq/L (ref 96–112)
Creatinine, Ser: 3.6 mg/dL — ABNORMAL HIGH (ref 0.50–1.35)
GFR calc non Af Amer: 16 mL/min — ABNORMAL LOW (ref >90)

## 2012-07-03 LAB — C-REACTIVE PROTEIN: CRP: 6 mg/dL — ABNORMAL HIGH (ref <0.60)

## 2012-07-03 LAB — SEDIMENTATION RATE: Sed Rate: 51 mm/hr — ABNORMAL HIGH (ref 0–16)

## 2012-07-03 LAB — IRON AND TIBC
Saturation Ratios: 19 % — ABNORMAL LOW (ref 20–55)
TIBC: 177 ug/dL — ABNORMAL LOW (ref 215–435)

## 2012-07-03 LAB — HEPATIC FUNCTION PANEL
AST: 15 U/L (ref 0–37)
Bilirubin, Direct: 0.1 mg/dL (ref 0.0–0.3)
Indirect Bilirubin: 0.3 mg/dL (ref 0.3–0.9)
Total Bilirubin: 0.4 mg/dL (ref 0.3–1.2)

## 2012-07-04 LAB — BASIC METABOLIC PANEL
Chloride: 99 mEq/L (ref 96–112)
GFR calc Af Amer: 20 mL/min — ABNORMAL LOW (ref >90)
GFR calc non Af Amer: 17 mL/min — ABNORMAL LOW (ref >90)
Potassium: 4.5 mEq/L (ref 3.5–5.1)
Sodium: 138 mEq/L (ref 135–145)

## 2012-07-04 LAB — HEMOGLOBIN AND HEMATOCRIT, BLOOD
HCT: 28.7 % — ABNORMAL LOW (ref 39.0–52.0)
Hemoglobin: 9.2 g/dL — ABNORMAL LOW (ref 13.0–17.0)

## 2012-07-05 LAB — CBC
HCT: 25.3 % — ABNORMAL LOW (ref 39.0–52.0)
MCHC: 32 g/dL (ref 30.0–36.0)
Platelets: 125 10*3/uL — ABNORMAL LOW (ref 150–400)
RDW: 18.8 % — ABNORMAL HIGH (ref 11.5–15.5)
WBC: 5 10*3/uL (ref 4.0–10.5)

## 2012-07-05 LAB — DIFFERENTIAL
Basophils Absolute: 0 10*3/uL (ref 0.0–0.1)
Basophils Relative: 0 % (ref 0–1)
Monocytes Absolute: 0.4 10*3/uL (ref 0.1–1.0)
Neutro Abs: 3.7 10*3/uL (ref 1.7–7.7)
Neutrophils Relative %: 74 % (ref 43–77)

## 2012-07-05 LAB — RENAL FUNCTION PANEL
Albumin: 1.9 g/dL — ABNORMAL LOW (ref 3.5–5.2)
Calcium: 9.3 mg/dL (ref 8.4–10.5)
GFR calc Af Amer: 20 mL/min — ABNORMAL LOW (ref >90)
GFR calc non Af Amer: 17 mL/min — ABNORMAL LOW (ref >90)
Phosphorus: 4.5 mg/dL (ref 2.3–4.6)
Potassium: 4.9 mEq/L (ref 3.5–5.1)
Sodium: 136 mEq/L (ref 135–145)

## 2012-07-06 LAB — RENAL FUNCTION PANEL
Albumin: 1.8 g/dL — ABNORMAL LOW (ref 3.5–5.2)
BUN: 61 mg/dL — ABNORMAL HIGH (ref 6–23)
Chloride: 101 mEq/L (ref 96–112)
GFR calc Af Amer: 21 mL/min — ABNORMAL LOW (ref >90)
GFR calc non Af Amer: 18 mL/min — ABNORMAL LOW (ref >90)
Potassium: 5.4 mEq/L — ABNORMAL HIGH (ref 3.5–5.1)
Sodium: 138 mEq/L (ref 135–145)

## 2012-07-06 LAB — CBC
MCV: 90.9 fL (ref 78.0–100.0)
Platelets: 153 10*3/uL (ref 150–400)
RBC: 2.65 MIL/uL — ABNORMAL LOW (ref 4.22–5.81)
RDW: 18.4 % — ABNORMAL HIGH (ref 11.5–15.5)
WBC: 4.1 10*3/uL (ref 4.0–10.5)

## 2012-07-06 LAB — DIFFERENTIAL
Basophils Absolute: 0 10*3/uL (ref 0.0–0.1)
Lymphocytes Relative: 17 % (ref 12–46)
Lymphs Abs: 0.7 10*3/uL (ref 0.7–4.0)
Neutro Abs: 3 10*3/uL (ref 1.7–7.7)
Neutrophils Relative %: 74 % (ref 43–77)

## 2012-07-06 LAB — PREPARE RBC (CROSSMATCH)

## 2012-07-07 DIAGNOSIS — A419 Sepsis, unspecified organism: Secondary | ICD-10-CM

## 2012-07-07 DIAGNOSIS — A4101 Sepsis due to Methicillin susceptible Staphylococcus aureus: Secondary | ICD-10-CM

## 2012-07-07 LAB — HEMOGLOBIN AND HEMATOCRIT, BLOOD: Hemoglobin: 10.2 g/dL — ABNORMAL LOW (ref 13.0–17.0)

## 2012-07-07 LAB — BASIC METABOLIC PANEL
BUN: 58 mg/dL — ABNORMAL HIGH (ref 6–23)
Calcium: 8.8 mg/dL (ref 8.4–10.5)
Chloride: 100 mEq/L (ref 96–112)
Creatinine, Ser: 2.85 mg/dL — ABNORMAL HIGH (ref 0.50–1.35)
GFR calc Af Amer: 25 mL/min — ABNORMAL LOW (ref >90)

## 2012-07-07 NOTE — Progress Notes (Signed)
Patient ID: Wayne [name], male   DOB: 1941-01-07, 71 y.o.   MRN: [id number] Continue wound VAC change 3 times a week, set at - suction continuous, no restrictions with PT

## 2012-07-08 LAB — BASIC METABOLIC PANEL
BUN: 57 mg/dL — ABNORMAL HIGH (ref 6–23)
Chloride: 97 mEq/L (ref 96–112)
Creatinine, Ser: 2.67 mg/dL — ABNORMAL HIGH (ref 0.50–1.35)
GFR calc Af Amer: 27 mL/min — ABNORMAL LOW (ref >90)
Glucose, Bld: 205 mg/dL — ABNORMAL HIGH (ref 70–99)
Potassium: 4.4 mEq/L (ref 3.5–5.1)

## 2012-07-08 LAB — TYPE AND SCREEN
ABO/RH(D): O POS
Antibody Screen: NEGATIVE
Unit division: 0

## 2012-07-09 LAB — BASIC METABOLIC PANEL
BUN: 55 mg/dL — ABNORMAL HIGH (ref 6–23)
CO2: 26 mEq/L (ref 19–32)
Calcium: 8.1 mg/dL — ABNORMAL LOW (ref 8.4–10.5)
Chloride: 97 mEq/L (ref 96–112)
Creatinine, Ser: 2.5 mg/dL — ABNORMAL HIGH (ref 0.50–1.35)

## 2012-07-10 LAB — BASIC METABOLIC PANEL
BUN: 58 mg/dL — ABNORMAL HIGH (ref 6–23)
CO2: 29 mEq/L (ref 19–32)
Calcium: 8.5 mg/dL (ref 8.4–10.5)
Creatinine, Ser: 2.19 mg/dL — ABNORMAL HIGH (ref 0.50–1.35)
GFR calc non Af Amer: 29 mL/min — ABNORMAL LOW (ref >90)
Glucose, Bld: 163 mg/dL — ABNORMAL HIGH (ref 70–99)

## 2012-07-10 LAB — CBC WITH DIFFERENTIAL/PLATELET
Basophils Absolute: 0 10*3/uL (ref 0.0–0.1)
Basophils Relative: 0 % (ref 0–1)
Eosinophils Absolute: 0 10*3/uL (ref 0.0–0.7)
Eosinophils Relative: 0 % (ref 0–5)
Lymphs Abs: 0.5 10*3/uL — ABNORMAL LOW (ref 0.7–4.0)
MCH: 27.2 pg (ref 26.0–34.0)
MCHC: 31.9 g/dL (ref 30.0–36.0)
MCV: 85.4 fL (ref 78.0–100.0)
Neutrophils Relative %: 87 % — ABNORMAL HIGH (ref 43–77)
Platelets: 329 10*3/uL (ref 150–400)
RDW: 18.7 % — ABNORMAL HIGH (ref 11.5–15.5)

## 2012-07-11 LAB — BASIC METABOLIC PANEL
Calcium: 8.4 mg/dL (ref 8.4–10.5)
GFR calc Af Amer: 40 mL/min — ABNORMAL LOW (ref >90)
GFR calc non Af Amer: 35 mL/min — ABNORMAL LOW (ref >90)
Glucose, Bld: 107 mg/dL — ABNORMAL HIGH (ref 70–99)
Sodium: 137 mEq/L (ref 135–145)

## 2012-07-12 LAB — BASIC METABOLIC PANEL
BUN: 48 mg/dL — ABNORMAL HIGH (ref 6–23)
GFR calc Af Amer: 52 mL/min — ABNORMAL LOW (ref >90)
GFR calc non Af Amer: 45 mL/min — ABNORMAL LOW (ref >90)
Potassium: 3.9 mEq/L (ref 3.5–5.1)

## 2012-07-13 LAB — BASIC METABOLIC PANEL
Calcium: 8.1 mg/dL — ABNORMAL LOW (ref 8.4–10.5)
Chloride: 99 mEq/L (ref 96–112)
Creatinine, Ser: 1.5 mg/dL — ABNORMAL HIGH (ref 0.50–1.35)
GFR calc Af Amer: 53 mL/min — ABNORMAL LOW (ref >90)
Sodium: 136 mEq/L (ref 135–145)

## 2012-07-14 DIAGNOSIS — R0602 Shortness of breath: Secondary | ICD-10-CM

## 2012-07-14 DIAGNOSIS — R609 Edema, unspecified: Secondary | ICD-10-CM

## 2012-07-14 LAB — BASIC METABOLIC PANEL
Calcium: 8.1 mg/dL — ABNORMAL LOW (ref 8.4–10.5)
GFR calc non Af Amer: 54 mL/min — ABNORMAL LOW (ref >90)
Sodium: 137 mEq/L (ref 135–145)

## 2012-07-14 NOTE — Progress Notes (Signed)
Bilateral lower extremity venous duplex completed.  Preliminary report is negative for DVT, SVT, or a Baker's cyst.  Left popliteal and calf veins not evaluated due to bandages.

## 2012-07-15 LAB — CBC WITH DIFFERENTIAL/PLATELET
Basophils Relative: 0 % (ref 0–1)
Hemoglobin: 11.7 g/dL — ABNORMAL LOW (ref 13.0–17.0)
Lymphocytes Relative: 11 % — ABNORMAL LOW (ref 12–46)
Lymphs Abs: 1.5 10*3/uL (ref 0.7–4.0)
Monocytes Relative: 7 % (ref 3–12)
Neutro Abs: 11.4 10*3/uL — ABNORMAL HIGH (ref 1.7–7.7)
Neutrophils Relative %: 82 % — ABNORMAL HIGH (ref 43–77)
RBC: 4.24 MIL/uL (ref 4.22–5.81)
WBC: 13.9 10*3/uL — ABNORMAL HIGH (ref 4.0–10.5)

## 2012-07-15 LAB — BASIC METABOLIC PANEL
GFR calc Af Amer: 60 mL/min — ABNORMAL LOW (ref >90)
GFR calc non Af Amer: 52 mL/min — ABNORMAL LOW (ref >90)
Potassium: 3.5 mEq/L (ref 3.5–5.1)
Sodium: 138 mEq/L (ref 135–145)

## 2012-07-16 ENCOUNTER — Other Ambulatory Visit (HOSPITAL_COMMUNITY): Payer: Self-pay

## 2012-07-16 DIAGNOSIS — L02419 Cutaneous abscess of limb, unspecified: Secondary | ICD-10-CM | POA: Diagnosis not present

## 2012-07-16 LAB — CBC WITH DIFFERENTIAL/PLATELET
Basophils Relative: 0 % (ref 0–1)
HCT: 33.9 % — ABNORMAL LOW (ref 39.0–52.0)
Hemoglobin: 11.1 g/dL — ABNORMAL LOW (ref 13.0–17.0)
MCHC: 32.7 g/dL (ref 30.0–36.0)
Monocytes Absolute: 0.6 10*3/uL (ref 0.1–1.0)
Monocytes Relative: 8 % (ref 3–12)
Neutro Abs: 5.7 10*3/uL (ref 1.7–7.7)

## 2012-07-16 LAB — BASIC METABOLIC PANEL
BUN: 34 mg/dL — ABNORMAL HIGH (ref 6–23)
Chloride: 100 mEq/L (ref 96–112)
GFR calc Af Amer: 61 mL/min — ABNORMAL LOW (ref >90)
Glucose, Bld: 77 mg/dL (ref 70–99)
Potassium: 3.6 mEq/L (ref 3.5–5.1)

## 2012-07-16 MED ORDER — GADOBENATE DIMEGLUMINE 529 MG/ML IV SOLN
20.0000 mL | Freq: Once | INTRAVENOUS | Status: AC | PRN
  Administered 2012-07-16: 20 mL via INTRAVENOUS

## 2012-07-17 DIAGNOSIS — L03119 Cellulitis of unspecified part of limb: Secondary | ICD-10-CM

## 2012-07-17 DIAGNOSIS — L02419 Cutaneous abscess of limb, unspecified: Secondary | ICD-10-CM

## 2012-07-17 LAB — BASIC METABOLIC PANEL
BUN: 31 mg/dL — ABNORMAL HIGH (ref 6–23)
CO2: 30 mEq/L (ref 19–32)
Chloride: 98 mEq/L (ref 96–112)
GFR calc non Af Amer: 59 mL/min — ABNORMAL LOW (ref >90)
Glucose, Bld: 128 mg/dL — ABNORMAL HIGH (ref 70–99)
Potassium: 3.4 mEq/L — ABNORMAL LOW (ref 3.5–5.1)

## 2012-07-18 ENCOUNTER — Other Ambulatory Visit: Payer: Self-pay

## 2012-07-18 LAB — BASIC METABOLIC PANEL
BUN: 27 mg/dL — ABNORMAL HIGH (ref 6–23)
CO2: 29 mEq/L (ref 19–32)
Calcium: 8 mg/dL — ABNORMAL LOW (ref 8.4–10.5)
Creatinine, Ser: 1.12 mg/dL (ref 0.50–1.35)
GFR calc non Af Amer: 66 mL/min — ABNORMAL LOW (ref >90)
Glucose, Bld: 110 mg/dL — ABNORMAL HIGH (ref 70–99)
Sodium: 136 mEq/L (ref 135–145)

## 2012-07-18 LAB — CBC
MCH: 27.5 pg (ref 26.0–34.0)
MCHC: 31.5 g/dL (ref 30.0–36.0)
MCV: 87.2 fL (ref 78.0–100.0)
Platelets: 238 10*3/uL (ref 150–400)
RDW: 17.6 % — ABNORMAL HIGH (ref 11.5–15.5)

## 2012-07-19 LAB — BASIC METABOLIC PANEL
CO2: 31 mEq/L (ref 19–32)
Calcium: 7.9 mg/dL — ABNORMAL LOW (ref 8.4–10.5)
Creatinine, Ser: 1.1 mg/dL (ref 0.50–1.35)
GFR calc non Af Amer: 67 mL/min — ABNORMAL LOW (ref >90)
Sodium: 138 mEq/L (ref 135–145)

## 2012-07-19 LAB — MAGNESIUM: Magnesium: 1.4 mg/dL — ABNORMAL LOW (ref 1.5–2.5)

## 2012-07-19 NOTE — Progress Notes (Signed)
    [facility]    Subjective: No new complaints   Antibiotics:  Bactrim DS one tablet bid  Medications: See MAR   Objective: afebrile   Physical Exam: General: Alert and awake, oriented x3, not in any acute distress. HEENT: anicteric sclera,EOMI CVS regular rate, normal r,  no murmur rubs or gallops Chest: clear to auscultation bilaterally, no wheezing, rales or rhonchi Abdomen: soft nontender, nondistended, normal bowel sounds, Extremities: left leg with open wound on medial aspect extending posteriorly, largely bloody material with some purulent material on proximal wound  Neuro: nonfocal  Lab Results:  Basename 07/18/12 0558  WBC 6.9  HGB 10.5*  HCT 33.3*  PLT 238    BMET  Basename 07/19/12 0601 07/18/12 0558  NA 138 136  K 3.6 3.6  CL 101 99  CO2 31 29  GLUCOSE 91 110*  BUN 25* 27*  CREATININE 1.10 1.12  CALCIUM 7.9* 8.0*    Micro Results: Recent Results (from the past 240 hour(s))  WOUND CULTURE     Status: Normal (Preliminary result)   Collection Time   07/17/12  2:31 PM      Component Value Range Status Comment   Specimen Description WOUND LEG   Final    Special Requests NONE   Final    Gram Stain PENDING   Incomplete    Culture MODERATE PSEUDOMONAS AERUGINOSA   Final    Report Status PENDING   Incomplete     Studies/Results:  MRI tibia/fibula 07/16/12 IMPRESSION:  1. Subcutaneous defect over the medial right leg compatible with  debridement.  2. Small fluid collection in the superficial aspect of the medial  head of the gastrocnemius measuring 15 mm x 39 mm x 5 cm cranial-  caudal. This is most concerning for abscess with adjacent  infectious myositis. Hematoma or seroma considered less likely.  3. Negative for osteomyelitis.     Assessment/Plan: Wayne [name] is a 71 y.o. male with  Chronic leg infection wound dehiscence withou osteo on MRI  But with abscess in gastrocnemius. Dr Lajoyce Corners plans on taking back to  OR today  Leg infection/abscess: --Dr. Lajoyce Corners to take back to OR --would send intraoperative cultures for bacterial, AFB and fungal cultures --OK to continue the bactrim for now    LOS: 17 days   [name] 07/19/2012, 12:30 PM

## 2012-07-20 ENCOUNTER — Encounter
Admission: AD | Disposition: A | Discharge status: Home / Self Care | Payer: Self-pay | Source: Ambulatory Visit | Attending: Internal Medicine

## 2012-07-20 ENCOUNTER — Encounter (HOSPITAL_COMMUNITY): Payer: Self-pay | Admitting: Anesthesiology

## 2012-07-20 ENCOUNTER — Encounter: Payer: Self-pay | Admitting: Certified Registered Nurse Anesthetist

## 2012-07-20 ENCOUNTER — Ambulatory Visit (HOSPITAL_COMMUNITY): Payer: Self-pay | Admitting: Anesthesiology

## 2012-07-20 HISTORY — PX: I & D EXTREMITY: SHX5045

## 2012-07-20 LAB — PREALBUMIN: Prealbumin: 24.1 mg/dL (ref 17.0–34.0)

## 2012-07-20 LAB — BASIC METABOLIC PANEL
CO2: 32 mEq/L (ref 19–32)
Calcium: 8.4 mg/dL (ref 8.4–10.5)
Creatinine, Ser: 1.19 mg/dL (ref 0.50–1.35)
GFR calc non Af Amer: 61 mL/min — ABNORMAL LOW (ref >90)
Sodium: 140 mEq/L (ref 135–145)

## 2012-07-20 SURGERY — IRRIGATION AND DEBRIDEMENT EXTREMITY
Anesthesia: General | Site: Leg Lower | Laterality: Left

## 2012-07-20 MED ORDER — NEOSTIGMINE METHYLSULFATE 1 MG/ML IJ SOLN
INTRAMUSCULAR | Status: DC | PRN
  Administered 2012-07-20: 4 mg via INTRAVENOUS

## 2012-07-20 MED ORDER — EPHEDRINE SULFATE 50 MG/ML IJ SOLN
INTRAMUSCULAR | Status: DC | PRN
  Administered 2012-07-20: 15 mg via INTRAVENOUS

## 2012-07-20 MED ORDER — SODIUM CHLORIDE 0.9 % IR SOLN
Status: DC | PRN
  Administered 2012-07-20: 3000 mL

## 2012-07-20 MED ORDER — HYDROMORPHONE HCL PF 1 MG/ML IJ SOLN
0.2500 mg | INTRAMUSCULAR | Status: DC | PRN
  Administered 2012-07-20 (×2): 0.5 mg via INTRAVENOUS

## 2012-07-20 MED ORDER — HYDROMORPHONE HCL PF 1 MG/ML IJ SOLN
INTRAMUSCULAR | Status: AC
  Administered 2012-07-20: 0.5 mg via INTRAVENOUS
  Filled 2012-07-20: qty 1

## 2012-07-20 MED ORDER — ONDANSETRON HCL 4 MG/2ML IJ SOLN
INTRAMUSCULAR | Status: DC | PRN
  Administered 2012-07-20: 4 mg via INTRAVENOUS

## 2012-07-20 MED ORDER — LIDOCAINE HCL (CARDIAC) 20 MG/ML IV SOLN
INTRAVENOUS | Status: DC | PRN
  Administered 2012-07-20: 70 mg via INTRAVENOUS

## 2012-07-20 MED ORDER — PROPOFOL 10 MG/ML IV EMUL
INTRAVENOUS | Status: DC | PRN
  Administered 2012-07-20: 90 mg via INTRAVENOUS

## 2012-07-20 MED ORDER — PHENYLEPHRINE HCL 10 MG/ML IJ SOLN
INTRAMUSCULAR | Status: DC | PRN
  Administered 2012-07-20: 80 ug via INTRAVENOUS
  Administered 2012-07-20 (×2): 120 ug via INTRAVENOUS

## 2012-07-20 MED ORDER — ONDANSETRON HCL 4 MG/2ML IJ SOLN: 4.0000 mg | Freq: Once | INTRAMUSCULAR | Status: AC | PRN

## 2012-07-20 MED ORDER — VANCOMYCIN HCL 1000 MG IV SOLR
1000.0000 mg | INTRAVENOUS | Status: AC
  Administered 2012-07-20: 1000 mg
  Filled 2012-07-20: qty 1000

## 2012-07-20 MED ORDER — LACTATED RINGERS IV SOLN
INTRAVENOUS | Status: DC | PRN
  Administered 2012-07-20: 20:00:00 via INTRAVENOUS

## 2012-07-20 MED ORDER — GLYCOPYRROLATE 0.2 MG/ML IJ SOLN
INTRAMUSCULAR | Status: DC | PRN
  Administered 2012-07-20: .6 mg via INTRAVENOUS

## 2012-07-20 MED ORDER — ROCURONIUM BROMIDE 100 MG/10ML IV SOLN
INTRAVENOUS | Status: DC | PRN
  Administered 2012-07-20: 45 mg via INTRAVENOUS

## 2012-07-20 MED ORDER — FENTANYL CITRATE 0.05 MG/ML IJ SOLN
INTRAMUSCULAR | Status: DC | PRN
  Administered 2012-07-20: 100 ug via INTRAVENOUS

## 2012-07-20 SURGICAL SUPPLY — 44 items
BANDAGE GAUZE ELAST BULKY 4 IN (GAUZE/BANDAGES/DRESSINGS) ×1 IMPLANT
BLADE SURG 10 STRL SS (BLADE) ×2 IMPLANT
BNDG COHESIVE 4X5 TAN STRL (GAUZE/BANDAGES/DRESSINGS) ×2 IMPLANT
BNDG COHESIVE 6X5 TAN STRL LF (GAUZE/BANDAGES/DRESSINGS) ×3 IMPLANT
CLOTH BEACON ORANGE TIMEOUT ST (SAFETY) ×2 IMPLANT
COVER SURGICAL LIGHT HANDLE (MISCELLANEOUS) ×2 IMPLANT
DRAPE U-SHAPE 47X51 STRL (DRAPES) ×2 IMPLANT
DRSG ADAPTIC 3X8 NADH LF (GAUZE/BANDAGES/DRESSINGS) ×4 IMPLANT
DRSG MEPILEX BORDER 4X8 (GAUZE/BANDAGES/DRESSINGS) ×1 IMPLANT
DRSG PAD ABDOMINAL 8X10 ST (GAUZE/BANDAGES/DRESSINGS) ×3 IMPLANT
DURAPREP 26ML APPLICATOR (WOUND CARE) ×2 IMPLANT
ELECT CAUTERY BLADE 6.4 (BLADE) IMPLANT
ELECT REM PT RETURN 9FT ADLT (ELECTROSURGICAL) ×2
ELECTRODE REM PT RTRN 9FT ADLT (ELECTROSURGICAL) IMPLANT
GLOVE BIOGEL PI IND STRL 9 (GLOVE) ×1 IMPLANT
GLOVE BIOGEL PI INDICATOR 9 (GLOVE) ×1
GLOVE SURG ORTHO 9.0 STRL STRW (GLOVE) ×2 IMPLANT
GOWN PREVENTION PLUS XLARGE (GOWN DISPOSABLE) ×2 IMPLANT
GOWN SRG XL XLNG 56XLVL 4 (GOWN DISPOSABLE) ×1 IMPLANT
GOWN STRL NON-REIN XL XLG LVL4 (GOWN DISPOSABLE) ×2
HANDPIECE INTERPULSE COAX TIP (DISPOSABLE)
KIT BASIN OR (CUSTOM PROCEDURE TRAY) ×2 IMPLANT
KIT ROOM TURNOVER OR (KITS) ×2 IMPLANT
KIT STIMULAN RAPID CURE  10CC (Orthopedic Implant) ×1 IMPLANT
KIT STIMULAN RAPID CURE 10CC (Orthopedic Implant) IMPLANT
MANIFOLD NEPTUNE II (INSTRUMENTS) ×2 IMPLANT
NDL SAFETY ECLIPSE 18X1.5 (NEEDLE) IMPLANT
NEEDLE HYPO 18GX1.5 SHARP (NEEDLE) ×2
NS IRRIG 1000ML POUR BTL (IV SOLUTION) ×2 IMPLANT
PACK ORTHO EXTREMITY (CUSTOM PROCEDURE TRAY) ×2 IMPLANT
PAD ARMBOARD 7.5X6 YLW CONV (MISCELLANEOUS) ×4 IMPLANT
SET HNDPC FAN SPRY TIP SCT (DISPOSABLE) IMPLANT
SPONGE GAUZE 4X4 12PLY (GAUZE/BANDAGES/DRESSINGS) ×2 IMPLANT
SPONGE LAP 18X18 X RAY DECT (DISPOSABLE) ×3 IMPLANT
STOCKINETTE IMPERVIOUS 9X36 MD (GAUZE/BANDAGES/DRESSINGS) ×2 IMPLANT
SUT ETHILON 2 0 PSLX (SUTURE) ×5 IMPLANT
SYRINGE 10CC LL (SYRINGE) ×1 IMPLANT
TOWEL OR 17X24 6PK STRL BLUE (TOWEL DISPOSABLE) ×2 IMPLANT
TOWEL OR 17X26 10 PK STRL BLUE (TOWEL DISPOSABLE) ×2 IMPLANT
TUBE ANAEROBIC SPECIMEN COL (MISCELLANEOUS) ×1 IMPLANT
TUBE CONNECTING 12X1/4 (SUCTIONS) ×2 IMPLANT
UNDERPAD 30X30 INCONTINENT (UNDERPADS AND DIAPERS) ×2 IMPLANT
WATER STERILE IRR 1000ML POUR (IV SOLUTION) ×1 IMPLANT
YANKAUER SUCT BULB TIP NO VENT (SUCTIONS) ×2 IMPLANT

## 2012-07-20 NOTE — Anesthesia Postprocedure Evaluation (Signed)
  Anesthesia Post-op Note  Patient: Wayne [name]  Procedure(s) Performed: Procedure(s) (LRB): IRRIGATION AND DEBRIDEMENT EXTREMITY (Left)  Patient Location: PACU  Anesthesia Type: General  Level of Consciousness: awake, alert  and oriented  Airway and Oxygen Therapy: Patient Spontanous Breathing and Patient connected to nasal cannula oxygen  Post-op Pain: mild  Post-op Assessment: Post-op Vital signs reviewed  Post-op Vital Signs: Reviewed  Complications: No apparent anesthesia complications 

## 2012-07-20 NOTE — Anesthesia Preprocedure Evaluation (Addendum)
Anesthesia Evaluation  Patient identified by MRN, date of birth, ID band Patient awake    Reviewed: Allergy & Precautions, H&P , NPO status , Patient's Chart, lab work & pertinent test results, reviewed documented beta blocker date and time   Airway Mallampati: II TM Distance: >3 FB Neck ROM: Full    Dental  (+) Edentulous Lower and Edentulous Upper   Pulmonary shortness of breath and with exertion, sleep apnea ,  breath sounds clear to auscultation        Cardiovascular Exercise Tolerance: Poor hypertension, Pt. on medications and Pt. on home beta blockers + CAD, + Past MI and +CHF Rhythm:Regular Rate:Normal     Neuro/Psych PSYCHIATRIC DISORDERS Anxiety    GI/Hepatic GERD-  Medicated and Controlled,  Endo/Other  Type 2  Renal/GU      Musculoskeletal   Abdominal   Peds  Hematology   Anesthesia Other Findings Ischemic Cardiomyopathy, EF 30%  Reproductive/Obstetrics                          Anesthesia Physical Anesthesia Plan  ASA: IV  Anesthesia Plan: General   Post-op Pain Management:    Induction: Intravenous  Airway Management Planned: LMA and Oral ETT  Additional Equipment:   Intra-op Plan:   Post-operative Plan: Extubation in OR  Informed Consent: I have reviewed the patients History and Physical, chart, labs and discussed the procedure including the risks, benefits and alternatives for the proposed anesthesia with the patient or authorized representative who has indicated his/her understanding and acceptance.   Dental advisory given  Plan Discussed with: CRNA, Surgeon and Anesthesiologist  Anesthesia Plan Comments:        Anesthesia Quick Evaluation

## 2012-07-20 NOTE — Progress Notes (Signed)
Patient ID: Wayne [name], male   DOB: [birth year], 71 y.o.   MRN: [id number] Plan for irrigation and debridement placement of antibiotic beads and wound closure today as add on approximately 5 PM

## 2012-07-20 NOTE — Anesthesia Procedure Notes (Signed)
Procedure Name: Intubation Date/Time: 07/20/2012 8:04 PM Performed by: Rogelia Boga Pre-anesthesia Checklist: Patient identified, Emergency Drugs available, Suction available, Patient being monitored and Timeout performed Patient Re-evaluated:Patient Re-evaluated prior to inductionOxygen Delivery Method: Circle system utilized Preoxygenation: Pre-oxygenation with 100% oxygen Intubation Type: IV induction Ventilation: Mask ventilation without difficulty and Oral airway inserted - appropriate to patient size Laryngoscope Size: Mac and 4 Grade View: Grade I Tube type: Oral Tube size: 7.5 mm Number of attempts: 1 Airway Equipment and Method: Stylet Placement Confirmation: ETT inserted through vocal cords under direct vision,  positive ETCO2 and breath sounds checked- equal and bilateral Secured at: 21 cm Tube secured with: Tape Dental Injury: Teeth and Oropharynx as per pre-operative assessment

## 2012-07-20 NOTE — Op Note (Signed)
OPERATIVE REPORT  DATE OF SURGERY: 07/20/2012  PATIENT:  Wayne [name],  71 y.o. male  PRE-OPERATIVE DIAGNOSIS:  Left Leg Open Wound  POST-OPERATIVE DIAGNOSIS:  Left Leg Open Wound, sacral wound  PROCEDURE:  Procedure(s): IRRIGATION AND DEBRIDEMENT EXTREMITY excisional debridement of skin soft soft tissue muscle and bone Placement of antibiotic beads with 10 cc of stimulant with 1 g vancomycin and 240 mg gentamicin. Cultures obtained with both aerobic anaerobic AFB and fungal. Local tissue rearrangement for wound 30 cm long and 5 cm wide closure. Findings showed a new abscess proximal medial along the hamstrings SURGEON:  Surgeon(s): Nadara Mustard, MD  ANESTHESIA:   general  EBL:  Minimal ML  SPECIMEN:  Aspirate Cultures obtained TOURNIQUET:  * No tourniquets in log *  PROCEDURE DETAILS: Patient is a 71 year old gentleman status post a large abscess left lower extremity. Patient is undergone multiple irrigation and debridements. Patient has showed progressive wound breakdown at this time MRI scan shows a proximal abscess with possible necrotic muscle and patient was brought back to the operating room for repeat irrigation and debridement. Risks and benefits were discussed with the patient and his wife including persistent infection neurovascular injury need for additional surgery. Patient and his wife state they understand and wish to proceed at this time. Description of procedure patient was brought to the OR and underwent a general anesthetic. After adequate levels of anesthesia were obtained patient was placed in the prone position and the left lower extremity was prepped using DuraPrep and draped into a sterile field. The wound was opened from the posterior aspect of the ankle to proximal to the popliteal fossa. Dissection was carried down proximally along the hamstrings and a large abscess was encountered proximal to the popliteal fossa. This was irrigated with pulsatile lavage. There  was debridement of muscle with excision of muscle soft tissue and skin. There was irrigation and debridement of the bone. The muscle was contractile in viable. The large tissue flaps were rearranged and patient underwent local tissue advancement and rearrangement for closure of the wound. The wound extended from the posterior aspect of the ankle to proximal to the popliteal fossa. Wound was closed using 2-0 nylon. The wound was covered with Adaptic orthopedic sponges AB dressing Kerlix and Coban. Patient was transferred supine extubated and taken to the PACU in stable condition.  PLAN OF CARE: [facility]  PATIENT DISPOSITION:  PACU - hemodynamically stable.   Nadara Mustard, MD 07/20/2012 8:58 PM

## 2012-07-20 NOTE — Preoperative (Signed)
Beta Blockers   Reason not to administer Beta Blockers:Not Applicable 

## 2012-07-20 NOTE — Progress Notes (Signed)
    [facility]   Subjective: No new complaints   Antibiotics:  Bactrim DS one tablet bid  Medications: See MAR   Objective: afebrile   Physical Exam: General: Alert and awake, oriented x3, not in any acute distress. HEENT: anicteric sclera,EOMI CVS regular rate, normal r,  no murmur rubs or gallops Chest: clear to auscultation bilaterally, no wheezing, rales or rhonchi Abdomen: soft nontender, nondistended, normal bowel sounds, Extremities: left leg with dressing in place Neuro: nonfocal  Lab Results:  [facility] 07/18/12 0558  WBC 6.9  HGB 10.5*  HCT 33.3*  PLT 238    BMET  Basename 07/20/12 0653 07/19/12 0601  NA 140 138  K 5.1 3.6  CL 102 101  CO2 32 31  GLUCOSE 78 91  BUN 25* 25*  CREATININE 1.19 1.10  CALCIUM 8.4 7.9*    Micro Results: Recent Results (from the past 240 hour(s))  WOUND CULTURE     Status: Normal (Preliminary result)   Collection Time   07/17/12  2:31 PM      Component Value Range Status Comment   Specimen Description WOUND LEG   Final    Special Requests NONE   Final    Gram Stain     Final    Value: FEW WBC PRESENT,BOTH PMN AND MONONUCLEAR     NO SQUAMOUS EPITHELIAL CELLS SEEN     NO ORGANISMS SEEN   Culture MODERATE PSEUDOMONAS AERUGINOSA   Final    Report Status PENDING   Incomplete    Organism ID, Bacteria PSEUDOMONAS AERUGINOSA   Final     Studies/Results:  MRI tibia/fibula 07/16/12 IMPRESSION:  1. Subcutaneous defect over the medial right leg compatible with  debridement.  2. Small fluid collection in the superficial aspect of the medial  head of the gastrocnemius measuring 15 mm x 39 mm x 5 cm cranial-  caudal. This is most concerning for abscess with adjacent  infectious myositis. Hematoma or seroma considered less likely.  3. Negative for osteomyelitis.     Assessment/Plan: Wayne [name] is a 71 y.o. male with  Chronic leg infection wound dehiscence withou osteo on MRI  But with abscess  in gastrocnemius. Dr Lajoyce Corners plans on taking back to OR today at 5pm  Leg infection/abscess: --Dr. Lajoyce Corners to take back to OR --would send intraoperative cultures for bacterial, AFB and fungal cultures --OK to continue the bactrim for now    LOS: 18 days   [name] 07/20/2012, 4:53 PM

## 2012-07-20 NOTE — Transfer of Care (Signed)
Immediate Anesthesia Transfer of Care Note  Patient: Wayne [name]  Procedure(s) Performed: Procedure(s) (LRB): IRRIGATION AND DEBRIDEMENT EXTREMITY (Left)  Patient Location: PACU  Anesthesia Type: General  Level of Consciousness: awake, alert , oriented and patient cooperative  Airway & Oxygen Therapy: Patient Spontanous Breathing and Patient connected to nasal cannula oxygen  Post-op Assessment: Report given to PACU RN, Post -op Vital signs reviewed and stable and Patient moving all extremities X 4  Post vital signs: Reviewed and stable  Complications: No apparent anesthesia complications

## 2012-07-21 ENCOUNTER — Encounter (HOSPITAL_COMMUNITY): Payer: Self-pay | Admitting: Orthopedic Surgery

## 2012-07-21 ENCOUNTER — Other Ambulatory Visit (HOSPITAL_COMMUNITY): Payer: Self-pay

## 2012-07-21 DIAGNOSIS — J9819 Other pulmonary collapse: Secondary | ICD-10-CM | POA: Diagnosis not present

## 2012-07-21 DIAGNOSIS — Z09 Encounter for follow-up examination after completed treatment for conditions other than malignant neoplasm: Secondary | ICD-10-CM | POA: Diagnosis not present

## 2012-07-21 LAB — WOUND CULTURE

## 2012-07-21 LAB — CBC WITH DIFFERENTIAL/PLATELET
Basophils Absolute: 0 10*3/uL (ref 0.0–0.1)
Eosinophils Absolute: 0.1 10*3/uL (ref 0.0–0.7)
Eosinophils Relative: 1 % (ref 0–5)
MCH: 28.1 pg (ref 26.0–34.0)
MCV: 89.2 fL (ref 78.0–100.0)
Neutrophils Relative %: 83 % — ABNORMAL HIGH (ref 43–77)
Platelets: 234 10*3/uL (ref 150–400)
RBC: 3.7 MIL/uL — ABNORMAL LOW (ref 4.22–5.81)
RDW: 17.5 % — ABNORMAL HIGH (ref 11.5–15.5)
WBC: 10.3 10*3/uL (ref 4.0–10.5)

## 2012-07-21 LAB — BASIC METABOLIC PANEL
CO2: 27 mEq/L (ref 19–32)
Calcium: 8.2 mg/dL — ABNORMAL LOW (ref 8.4–10.5)
Creatinine, Ser: 1.2 mg/dL (ref 0.50–1.35)
GFR calc non Af Amer: 60 mL/min — ABNORMAL LOW (ref >90)
Glucose, Bld: 95 mg/dL (ref 70–99)
Sodium: 139 mEq/L (ref 135–145)

## 2012-07-21 NOTE — Progress Notes (Signed)
[facility]    Subjective: Sp surgery by Dr. Lajoyce Corners   Antibiotics:  Bactrim DS one tablet bid  Medications: See MAR   Objective: afebrileTemp:  [98.6 F (37 C)] 98.6 F (37 C) (08/06 2130) Pulse Rate:  [63] 63  (08/06 2200) Resp:  [12-18] 12  (08/06 2200) BP: (123-133)/(52-66) 133/52 mmHg (08/06 2200) SpO2:  [99 %] 99 % (08/06 2200)  Physical Exam: General: Alert and awake, oriented x3, not in any acute distress. HEENT: anicteric sclera,EOMI CVS regular rate, normal r,  no murmur rubs or gallops Chest: clear to auscultation bilaterally, no wheezing, rales or rhonchi Abdomen: soft nontender, nondistended, normal bowel sounds, Extremities: left leg with dressing in place Neuro: nonfocal  Lab Results:  Basename 07/21/12 0546  WBC 10.3  HGB 10.4*  HCT 33.0*  PLT 234    BMET  Basename 07/21/12 0546 07/20/12 2236 07/20/12 0653  NA 139 -- 140  K 3.9 4.2 --  CL 102 -- 102  CO2 27 -- 32  GLUCOSE 95 -- 78  BUN 21 -- 25*  CREATININE 1.20 -- 1.19  CALCIUM 8.2* -- 8.4    Micro Results: Recent Results (from the past 240 hour(s))  WOUND CULTURE     Status: Normal   Collection Time   07/17/12  2:31 PM      Component Value Range Status Comment   Specimen Description WOUND LEG   Final    Special Requests NONE   Final    Gram Stain     Final    Value: FEW WBC PRESENT,BOTH PMN AND MONONUCLEAR     NO SQUAMOUS EPITHELIAL CELLS SEEN     NO ORGANISMS SEEN   Culture MODERATE PSEUDOMONAS AERUGINOSA   Final    Report Status 07/21/2012 FINAL   Final    Organism ID, Bacteria PSEUDOMONAS AERUGINOSA   Final   ANAEROBIC CULTURE     Status: Normal (Preliminary result)   Collection Time   07/20/12  9:02 PM      Component Value Range Status Comment   Specimen Description WOUND LEG LEFT   Final    Special Requests NONE   Final    Gram Stain     Final    Value: NO WBC SEEN     NO SQUAMOUS EPITHELIAL CELLS SEEN     NO ORGANISMS SEEN   Culture      Final    Value: NO ANAEROBES ISOLATED; CULTURE IN PROGRESS FOR 5 DAYS   Report Status PENDING   Incomplete   WOUND CULTURE     Status: Normal (Preliminary result)   Collection Time   07/20/12  9:02 PM      Component Value Range Status Comment   Specimen Description WOUND LEG LEFT   Final    Special Requests NONE   Final    Gram Stain     Final    Value: NO WBC SEEN     NO SQUAMOUS EPITHELIAL CELLS SEEN     NO ORGANISMS SEEN   Culture NO GROWTH 1 DAY   Final    Report Status PENDING   Incomplete   AFB CULTURE WITH SMEAR     Status: Normal (Preliminary result)   Collection Time   07/20/12  9:02 PM      Component Value Range Status Comment   Specimen Description WOUND LEG LEFT   Final    Special Requests NONE   Final    ACID FAST SMEAR  NO ACID FAST BACILLI SEEN   Final    Culture     Final    Value: CULTURE WILL BE EXAMINED FOR 6 WEEKS BEFORE ISSUING A FINAL REPORT   Report Status PENDING   Incomplete   FUNGUS CULTURE W SMEAR     Status: Normal (Preliminary result)   Collection Time   07/20/12  9:02 PM      Component Value Range Status Comment   Specimen Description WOUND LEG LEFT   Final    Special Requests NONE   Final    Fungal Smear NO YEAST OR FUNGAL ELEMENTS SEEN   Final    Culture CULTURE IN PROGRESS FOR FOUR WEEKS   Final    Report Status PENDING   Incomplete     Studies/Results:  MRI tibia/fibula 07/16/12 IMPRESSION:  1. Subcutaneous defect over the medial right leg compatible with  debridement.  2. Small fluid collection in the superficial aspect of the medial  head of the gastrocnemius measuring 15 mm x 39 mm x 5 cm cranial-  caudal. This is most concerning for abscess with adjacent  infectious myositis. Hematoma or seroma considered less likely.  3. Negative for osteomyelitis.     Assessment/Plan: Wayne [name] is a 71 y.o. male with  Chronic leg infection wound dehiscence withou osteo on MRI  But with abscess in gastrocnemius. Dr Lajoyce Corners plans on taking back to OR  today at 5pm  Leg infection/abscess/osteomyelitis: now sp debridement by Dr. Lajoyce Corners. He grew pseudomonas from a wound culture  Given that the patient had bone debrided I am going to treat with IV antibiotics for osteomyelitis.  I will therefore start IV vancomycin and oral ciprofloxacin (will avoid beta lactams given hx of drug rash)  Will followup culture data from OR and treat for 6 weeks.    LOS: 19 days   [name] 07/21/2012, 7:02 PM

## 2012-07-21 NOTE — Anesthesia Postprocedure Evaluation (Signed)
  Anesthesia Post-op Note  Patient: Wayne [name]  Procedure(s) Performed: Procedure(s) (LRB): IRRIGATION AND DEBRIDEMENT EXTREMITY (Left)  Patient Location: PACU  Anesthesia Type: General  Level of Consciousness: awake, alert  and oriented  Airway and Oxygen Therapy: Patient Spontanous Breathing and Patient connected to nasal cannula oxygen  Post-op Pain: mild  Post-op Assessment: Post-op Vital signs reviewed  Post-op Vital Signs: Reviewed  Complications: No apparent anesthesia complications

## 2012-07-22 DIAGNOSIS — L02419 Cutaneous abscess of limb, unspecified: Secondary | ICD-10-CM

## 2012-07-22 DIAGNOSIS — L03119 Cellulitis of unspecified part of limb: Secondary | ICD-10-CM

## 2012-07-22 LAB — CBC WITH DIFFERENTIAL/PLATELET
Basophils Relative: 0 % (ref 0–1)
Eosinophils Absolute: 0 10*3/uL (ref 0.0–0.7)
Eosinophils Relative: 0 % (ref 0–5)
Hemoglobin: 8.3 g/dL — ABNORMAL LOW (ref 13.0–17.0)
MCH: 28.1 pg (ref 26.0–34.0)
MCHC: 32 g/dL (ref 30.0–36.0)
MCV: 87.8 fL (ref 78.0–100.0)
Monocytes Relative: 7 % (ref 3–12)
Neutrophils Relative %: 79 % — ABNORMAL HIGH (ref 43–77)

## 2012-07-22 LAB — C-REACTIVE PROTEIN: CRP: 12.9 mg/dL — ABNORMAL HIGH (ref <0.60)

## 2012-07-22 LAB — BASIC METABOLIC PANEL
BUN: 21 mg/dL (ref 6–23)
Calcium: 8.4 mg/dL (ref 8.4–10.5)
GFR calc non Af Amer: 58 mL/min — ABNORMAL LOW (ref >90)
Glucose, Bld: 216 mg/dL — ABNORMAL HIGH (ref 70–99)

## 2012-07-22 NOTE — Progress Notes (Signed)
[facility]   Subjective: No new complaints   Antibiotics:  Ciprofloxacin 07/21/12--> Vancomycin 07/21/12-->  Medications: See MAR   Objective: afebrile   Physical Exam: General: Alert and awake, oriented x3, not in any acute distress. HEENT: anicteric sclera,EOMI CVS regular rate, normal r,  no murmur rubs or gallops Chest: clear to auscultation bilaterally, no wheezing, rales or rhonchi Abdomen: soft nontender, nondistended, normal bowel sounds, Extremities: left leg with dressing in place Neuro: nonfocal  Lab Results:  Basename 07/22/12 0700 07/21/12 0546  WBC 5.9 10.3  HGB 8.3* 10.4*  HCT 25.9* 33.0*  PLT 167 234    BMET  Basename 07/22/12 0700 07/21/12 0546  NA 135 139  K 4.0 3.9  CL 99 102  CO2 28 27  GLUCOSE 216* 95  BUN 21 21  CREATININE 1.24 1.20  CALCIUM 8.4 8.2*    Micro Results: Recent Results (from the past 240 hour(s))  WOUND CULTURE     Status: Normal   Collection Time   07/17/12  2:31 PM      Component Value Range Status Comment   Specimen Description WOUND LEG   Final    Special Requests NONE   Final    Gram Stain     Final    Value: FEW WBC PRESENT,BOTH PMN AND MONONUCLEAR     NO SQUAMOUS EPITHELIAL CELLS SEEN     NO ORGANISMS SEEN   Culture MODERATE PSEUDOMONAS AERUGINOSA   Final    Report Status 07/21/2012 FINAL   Final    Organism ID, Bacteria PSEUDOMONAS AERUGINOSA   Final   ANAEROBIC CULTURE     Status: Normal (Preliminary result)   Collection Time   07/20/12  9:02 PM      Component Value Range Status Comment   Specimen Description WOUND LEG LEFT   Final    Special Requests NONE   Final    Gram Stain     Final    Value: NO WBC SEEN     NO SQUAMOUS EPITHELIAL CELLS SEEN     NO ORGANISMS SEEN   Culture     Final    Value: NO ANAEROBES ISOLATED; CULTURE IN PROGRESS FOR 5 DAYS   Report Status PENDING   Incomplete   WOUND CULTURE     Status: Normal (Preliminary result)   Collection Time   07/20/12   9:02 PM      Component Value Range Status Comment   Specimen Description WOUND LEG LEFT   Final    Special Requests NONE   Final    Gram Stain     Final    Value: NO WBC SEEN     NO SQUAMOUS EPITHELIAL CELLS SEEN     NO ORGANISMS SEEN   Culture MODERATE GRAM NEGATIVE RODS   Final    Report Status PENDING   Incomplete   AFB CULTURE WITH SMEAR     Status: Normal (Preliminary result)   Collection Time   07/20/12  9:02 PM      Component Value Range Status Comment   Specimen Description WOUND LEG LEFT   Final    Special Requests NONE   Final    ACID FAST SMEAR NO ACID FAST BACILLI SEEN   Final    Culture     Final    Value: CULTURE WILL BE EXAMINED FOR 6 WEEKS BEFORE ISSUING A FINAL REPORT   Report Status PENDING   Incomplete   FUNGUS CULTURE W SMEAR  Status: Normal (Preliminary result)   Collection Time   07/20/12  9:02 PM      Component Value Range Status Comment   Specimen Description WOUND LEG LEFT   Final    Special Requests NONE   Final    Fungal Smear NO YEAST OR FUNGAL ELEMENTS SEEN   Final    Culture CULTURE IN PROGRESS FOR FOUR WEEKS   Final    Report Status PENDING   Incomplete     Studies/Results:  MRI tibia/fibula 07/16/12 IMPRESSION:  1. Subcutaneous defect over the medial right leg compatible with  debridement.  2. Small fluid collection in the superficial aspect of the medial  head of the gastrocnemius measuring 15 mm x 39 mm x 5 cm cranial-  caudal. This is most concerning for abscess with adjacent  infectious myositis. Hematoma or seroma considered less likely.  3. Negative for osteomyelitis.     Assessment/Plan: Wayne [name] is a 71 y.o. male with  Chronic leg infection wound dehiscence withou osteo on MRI  But with abscess in gastrocnemius. Dr Lajoyce Corners plans on taking back to OR today at 5pm  Leg infection/abscess/osteomyelitis: now sp debridement by Dr. Lajoyce Corners. He grew pseudomonas from a wound culture  Given that the patient had bone debrided I am going to  treat with parenteral antibiotics for osteomyelitis. His intraop cultures is so far only growing GNR. If he grows pure GNR may consider narrowing to only treat this.  Willtreat for 6 weeks postoperatively  I spent greater than 45 minutes with the patient including greater than 50% of time in face to face counsel of the patient and in coordination of their care.   LOS: 20 days   [name] 07/22/2012, 5:25 PM

## 2012-07-23 LAB — BASIC METABOLIC PANEL
BUN: 24 mg/dL — ABNORMAL HIGH (ref 6–23)
CO2: 29 mEq/L (ref 19–32)
Chloride: 101 mEq/L (ref 96–112)
Creatinine, Ser: 1.34 mg/dL (ref 0.50–1.35)
GFR calc Af Amer: 61 mL/min — ABNORMAL LOW (ref >90)
Glucose, Bld: 139 mg/dL — ABNORMAL HIGH (ref 70–99)

## 2012-07-23 LAB — HEMOGLOBIN AND HEMATOCRIT, BLOOD
HCT: 25.4 % — ABNORMAL LOW (ref 39.0–52.0)
Hemoglobin: 8.2 g/dL — ABNORMAL LOW (ref 13.0–17.0)

## 2012-07-23 LAB — WOUND CULTURE: Gram Stain: NONE SEEN

## 2012-07-23 LAB — VANCOMYCIN, TROUGH: Vancomycin Tr: 14.4 ug/mL (ref 10.0–20.0)

## 2012-07-23 NOTE — Progress Notes (Signed)
[facility]   Subjective: No new complaints   Antibiotics:  Ciprofloxacin 07/21/12--> Vancomycin 07/21/12-->  Medications: See MAR   Objective: afebrile   Physical Exam: General: Alert and awake, oriented x3, not in any acute distress. HEENT: anicteric sclera,EOMI CVS regular rate, normal r,  no murmur rubs or gallops Chest: clear to auscultation bilaterally, no wheezing, rales or rhonchi Abdomen: soft nontender, nondistended, normal bowel sounds, Extremities: left leg with dressing in place Neuro: nonfocal  Lab Results:  Basename 07/23/12 0530 07/22/12 0700 07/21/12 0546  WBC -- 5.9 10.3  HGB 8.2* 8.3* --  HCT 25.4* 25.9* --  PLT -- 167 234    BMET  Basename 07/23/12 0530 07/22/12 0700  NA 137 135  K 4.0 4.0  CL 101 99  CO2 29 28  GLUCOSE 139* 216*  BUN 24* 21  CREATININE 1.34 1.24  CALCIUM 8.7 8.4    Micro Results: Recent Results (from the past 240 hour(s))  WOUND CULTURE     Status: Normal   Collection Time   07/17/12  2:31 PM      Component Value Range Status Comment   Specimen Description WOUND LEG   Final    Special Requests NONE   Final    Gram Stain     Final    Value: FEW WBC PRESENT,BOTH PMN AND MONONUCLEAR     NO SQUAMOUS EPITHELIAL CELLS SEEN     NO ORGANISMS SEEN   Culture MODERATE PSEUDOMONAS AERUGINOSA   Final    Report Status 07/21/2012 FINAL   Final    Organism ID, Bacteria PSEUDOMONAS AERUGINOSA   Final   ANAEROBIC CULTURE     Status: Normal (Preliminary result)   Collection Time   07/20/12  9:02 PM      Component Value Range Status Comment   Specimen Description WOUND LEG LEFT   Final    Special Requests NONE   Final    Gram Stain     Final    Value: NO WBC SEEN     NO SQUAMOUS EPITHELIAL CELLS SEEN     NO ORGANISMS SEEN   Culture     Final    Value: NO ANAEROBES ISOLATED; CULTURE IN PROGRESS FOR 5 DAYS   Report Status PENDING   Incomplete   WOUND CULTURE     Status: Normal   Collection Time   07/20/12  9:02 PM      Component Value Range Status Comment   Specimen Description WOUND LEG LEFT   Final    Special Requests NONE   Final    Gram Stain     Final    Value: NO WBC SEEN     NO SQUAMOUS EPITHELIAL CELLS SEEN     NO ORGANISMS SEEN   Culture MODERATE PSEUDOMONAS AERUGINOSA   Final    Report Status 07/23/2012 FINAL   Final    Organism ID, Bacteria PSEUDOMONAS AERUGINOSA   Final   AFB CULTURE WITH SMEAR     Status: Normal (Preliminary result)   Collection Time   07/20/12  9:02 PM      Component Value Range Status Comment   Specimen Description WOUND LEG LEFT   Final    Special Requests NONE   Final    ACID FAST SMEAR NO ACID FAST BACILLI SEEN   Final    Culture     Final    Value: CULTURE WILL BE EXAMINED FOR 6 WEEKS BEFORE ISSUING A FINAL  REPORT   Report Status PENDING   Incomplete   FUNGUS CULTURE W SMEAR     Status: Normal (Preliminary result)   Collection Time   07/20/12  9:02 PM      Component Value Range Status Comment   Specimen Description WOUND LEG LEFT   Final    Special Requests NONE   Final    Fungal Smear NO YEAST OR FUNGAL ELEMENTS SEEN   Final    Culture CULTURE IN PROGRESS FOR FOUR WEEKS   Final    Report Status PENDING   Incomplete     Studies/Results:  MRI tibia/fibula 07/16/12 IMPRESSION:  1. Subcutaneous defect over the medial right leg compatible with  debridement.  2. Small fluid collection in the superficial aspect of the medial  head of the gastrocnemius measuring 15 mm x 39 mm x 5 cm cranial-  caudal. This is most concerning for abscess with adjacent  infectious myositis. Hematoma or seroma considered less likely.  3. Negative for osteomyelitis.     Assessment/Plan: Wayne [name] is a 71 y.o. male with septic shock due to MSSA bacteremia and leg infection treated with ancef who then developed severe rash to cephalosporin. He finished more than month of therapy for this infection but had continued Chronic leg ulcer with  wound dehiscence without  osteo on MRI.  But with abscess in gastrocnemius sp  Debridement by Dr. Lajoyce Corners, He is now consistently growing pseudomonas from this wound  Leg infection/abscess/osteomyelitis: now sp debridement by Dr. Lajoyce Corners. He grew pseudomonas from a wound culture recently though MSSA was original culprit  Given that the patient had exposed  bone debrided I am going to treat with parenteral antibiotics for osteomyelitis.  Willtreat for 6 weeks postoperatively with vancomcin (to cover his MSSA) and ciprofloxacin for his pseudomonas  The stop date for his antibiotic course is 09/02/12  My understanding is pt will be dc from [place] to home before this time with CM setting up home antibiotics. ONce dc to home please have weekly vancomycin troughs, and weekly cbc and bmp faxed to Dr. Daiva Eves @ [phone]  I will have my clinic manager arrange fu with myself or ID clinic MD in September  LOS: 21 days   [name] 07/23/2012, 7:09 PM

## 2012-07-24 LAB — BASIC METABOLIC PANEL
Chloride: 99 mEq/L (ref 96–112)
GFR calc Af Amer: 65 mL/min — ABNORMAL LOW (ref >90)
GFR calc non Af Amer: 56 mL/min — ABNORMAL LOW (ref >90)
Potassium: 3.7 mEq/L (ref 3.5–5.1)
Sodium: 135 mEq/L (ref 135–145)

## 2012-07-24 LAB — HEMOGLOBIN AND HEMATOCRIT, BLOOD
HCT: 26.1 % — ABNORMAL LOW (ref 39.0–52.0)
Hemoglobin: 8.3 g/dL — ABNORMAL LOW (ref 13.0–17.0)

## 2012-07-25 LAB — BASIC METABOLIC PANEL
Calcium: 8.3 mg/dL — ABNORMAL LOW (ref 8.4–10.5)
GFR calc Af Amer: 60 mL/min — ABNORMAL LOW (ref >90)
GFR calc non Af Amer: 52 mL/min — ABNORMAL LOW (ref >90)
Potassium: 3.4 mEq/L — ABNORMAL LOW (ref 3.5–5.1)
Sodium: 139 mEq/L (ref 135–145)

## 2012-07-25 LAB — ANAEROBIC CULTURE

## 2012-07-26 LAB — CBC WITH DIFFERENTIAL/PLATELET
Basophils Absolute: 0 10*3/uL (ref 0.0–0.1)
Basophils Relative: 0 % (ref 0–1)
HCT: 26.9 % — ABNORMAL LOW (ref 39.0–52.0)
Hemoglobin: 8.6 g/dL — ABNORMAL LOW (ref 13.0–17.0)
Lymphocytes Relative: 20 % (ref 12–46)
MCHC: 32 g/dL (ref 30.0–36.0)
Monocytes Absolute: 0.5 10*3/uL (ref 0.1–1.0)
Monocytes Relative: 8 % (ref 3–12)
Neutro Abs: 3.9 10*3/uL (ref 1.7–7.7)
Neutrophils Relative %: 69 % (ref 43–77)
RDW: 16.8 % — ABNORMAL HIGH (ref 11.5–15.5)
WBC: 5.6 10*3/uL (ref 4.0–10.5)

## 2012-07-26 LAB — BASIC METABOLIC PANEL
BUN: 23 mg/dL (ref 6–23)
Chloride: 103 mEq/L (ref 96–112)
GFR calc Af Amer: 75 mL/min — ABNORMAL LOW (ref >90)
GFR calc non Af Amer: 65 mL/min — ABNORMAL LOW (ref >90)
Potassium: 3.8 mEq/L (ref 3.5–5.1)

## 2012-07-27 LAB — BASIC METABOLIC PANEL
CO2: 37 mEq/L — ABNORMAL HIGH (ref 19–32)
Chloride: 104 mEq/L (ref 96–112)
Creatinine, Ser: 0.6 mg/dL (ref 0.50–1.35)
GFR calc Af Amer: >90 mL/min (ref >90)
Potassium: 3.9 mEq/L (ref 3.5–5.1)

## 2012-07-27 LAB — VANCOMYCIN, TROUGH: Vancomycin Tr: 15.4 ug/mL (ref 10.0–20.0)

## 2012-07-28 LAB — CBC WITH DIFFERENTIAL/PLATELET
Basophils Absolute: 0 10*3/uL (ref 0.0–0.1)
Eosinophils Relative: 2 % (ref 0–5)
HCT: 25.9 % — ABNORMAL LOW (ref 39.0–52.0)
Hemoglobin: 8.2 g/dL — ABNORMAL LOW (ref 13.0–17.0)
Lymphocytes Relative: 19 % (ref 12–46)
Lymphs Abs: 1.1 10*3/uL (ref 0.7–4.0)
MCV: 86 fL (ref 78.0–100.0)
Monocytes Absolute: 0.4 10*3/uL (ref 0.1–1.0)
Monocytes Relative: 7 % (ref 3–12)
Neutro Abs: 4.2 10*3/uL (ref 1.7–7.7)
RBC: 3.01 MIL/uL — ABNORMAL LOW (ref 4.22–5.81)
RDW: 16.4 % — ABNORMAL HIGH (ref 11.5–15.5)
WBC: 5.8 10*3/uL (ref 4.0–10.5)

## 2012-07-28 LAB — BASIC METABOLIC PANEL
CO2: 32 mEq/L (ref 19–32)
Chloride: 103 mEq/L (ref 96–112)
GFR calc Af Amer: 67 mL/min — ABNORMAL LOW (ref >90)
Potassium: 3.4 mEq/L — ABNORMAL LOW (ref 3.5–5.1)
Sodium: 142 mEq/L (ref 135–145)

## 2012-07-29 LAB — BASIC METABOLIC PANEL
CO2: 28 mEq/L (ref 19–32)
Chloride: 103 mEq/L (ref 96–112)
Creatinine, Ser: 1.18 mg/dL (ref 0.50–1.35)
Glucose, Bld: 82 mg/dL (ref 70–99)

## 2012-07-30 LAB — BASIC METABOLIC PANEL
BUN: 18 mg/dL (ref 6–23)
Calcium: 8.7 mg/dL (ref 8.4–10.5)
Creatinine, Ser: 1.15 mg/dL (ref 0.50–1.35)
GFR calc Af Amer: 74 mL/min — ABNORMAL LOW (ref >90)
GFR calc non Af Amer: 64 mL/min — ABNORMAL LOW (ref >90)
Potassium: 3.3 mEq/L — ABNORMAL LOW (ref 3.5–5.1)

## 2012-07-31 LAB — BASIC METABOLIC PANEL
BUN: 19 mg/dL (ref 6–23)
Calcium: 8.8 mg/dL (ref 8.4–10.5)
Chloride: 104 mEq/L (ref 96–112)
Creatinine, Ser: 1.24 mg/dL (ref 0.50–1.35)
GFR calc Af Amer: 67 mL/min — ABNORMAL LOW (ref >90)
GFR calc non Af Amer: 58 mL/min — ABNORMAL LOW (ref >90)

## 2012-07-31 LAB — CBC
MCHC: 31.5 g/dL (ref 30.0–36.0)
Platelets: 225 10*3/uL (ref 150–400)
RDW: 16.2 % — ABNORMAL HIGH (ref 11.5–15.5)
WBC: 4.9 10*3/uL (ref 4.0–10.5)

## 2012-08-01 LAB — BASIC METABOLIC PANEL
Chloride: 105 mEq/L (ref 96–112)
GFR calc Af Amer: 68 mL/min — ABNORMAL LOW (ref >90)
GFR calc non Af Amer: 59 mL/min — ABNORMAL LOW (ref >90)
Potassium: 3.6 mEq/L (ref 3.5–5.1)
Sodium: 142 mEq/L (ref 135–145)

## 2012-08-01 LAB — VANCOMYCIN, TROUGH: Vancomycin Tr: 15.3 ug/mL (ref 10.0–20.0)

## 2012-08-02 LAB — BASIC METABOLIC PANEL
Chloride: 104 mEq/L (ref 96–112)
Creatinine, Ser: 1.25 mg/dL (ref 0.50–1.35)
GFR calc Af Amer: 67 mL/min — ABNORMAL LOW (ref >90)
GFR calc non Af Amer: 57 mL/min — ABNORMAL LOW (ref >90)
Potassium: 3.4 mEq/L — ABNORMAL LOW (ref 3.5–5.1)

## 2012-08-02 LAB — HEMOGLOBIN AND HEMATOCRIT, BLOOD: Hemoglobin: 7.9 g/dL — ABNORMAL LOW (ref 13.0–17.0)

## 2012-08-03 LAB — BASIC METABOLIC PANEL
BUN: 19 mg/dL (ref 6–23)
Calcium: 8.7 mg/dL (ref 8.4–10.5)
GFR calc Af Amer: 61 mL/min — ABNORMAL LOW (ref >90)
GFR calc non Af Amer: 52 mL/min — ABNORMAL LOW (ref >90)
Glucose, Bld: 133 mg/dL — ABNORMAL HIGH (ref 70–99)
Potassium: 3.5 mEq/L (ref 3.5–5.1)
Sodium: 141 mEq/L (ref 135–145)

## 2012-08-03 LAB — HEMOGLOBIN AND HEMATOCRIT, BLOOD: Hemoglobin: 8.5 g/dL — ABNORMAL LOW (ref 13.0–17.0)

## 2012-08-04 LAB — BASIC METABOLIC PANEL
BUN: 18 mg/dL (ref 6–23)
Chloride: 105 mEq/L (ref 96–112)
Creatinine, Ser: 1.31 mg/dL (ref 0.50–1.35)
GFR calc Af Amer: 63 mL/min — ABNORMAL LOW (ref >90)
Glucose, Bld: 73 mg/dL (ref 70–99)
Potassium: 3.5 mEq/L (ref 3.5–5.1)

## 2012-08-05 LAB — BASIC METABOLIC PANEL
BUN: 16 mg/dL (ref 6–23)
CO2: 30 mEq/L (ref 19–32)
Chloride: 105 mEq/L (ref 96–112)
Glucose, Bld: 81 mg/dL (ref 70–99)
Potassium: 3.6 mEq/L (ref 3.5–5.1)
Sodium: 144 mEq/L (ref 135–145)

## 2012-08-17 LAB — FUNGUS CULTURE W SMEAR: Fungal Smear: NONE SEEN

## 2012-08-31 ENCOUNTER — Inpatient Hospital Stay: Payer: Medicare Other | Admitting: Infectious Disease

## 2012-09-01 LAB — AFB CULTURE WITH SMEAR (NOT AT ARMC)

## 2012-10-15 LAB — WOUND CULTURE

## 2012-10-19 DIAGNOSIS — I83219 Varicose veins of right lower extremity with both ulcer of unspecified site and inflammation: Secondary | ICD-10-CM | POA: Diagnosis not present

## 2012-10-19 DIAGNOSIS — I83229 Varicose veins of left lower extremity with both ulcer of unspecified site and inflammation: Secondary | ICD-10-CM | POA: Diagnosis not present

## 2012-10-26 ENCOUNTER — Other Ambulatory Visit: Payer: Self-pay | Admitting: Cardiology

## 2012-12-28 ENCOUNTER — Telehealth: Payer: Self-pay | Admitting: Cardiology

## 2012-12-28 NOTE — Telephone Encounter (Signed)
New problem:   lisiniopril  10 mg.   Coreg  6.25 mg    [facility] on [address]

## 2012-12-29 ENCOUNTER — Telehealth: Payer: Self-pay

## 2012-12-29 DIAGNOSIS — I255 Ischemic cardiomyopathy: Secondary | ICD-10-CM

## 2012-12-29 MED ORDER — LISINOPRIL 10 MG PO TABS: 10.0000 mg | ORAL_TABLET | Freq: Every day | ORAL | Status: DC

## 2012-12-29 MED ORDER — CARVEDILOL 6.25 MG PO TABS: 6.2500 mg | ORAL_TABLET | Freq: Two times a day (BID) | ORAL | Status: DC

## 2012-12-29 NOTE — Telephone Encounter (Signed)
Spoke to patient was told received a message from Dr.McLean requesting cardiac meds to be refilled.Also needs echo and a appointment with Dr.McLean.Coreg and lisinopril sent to [facility] [address].Schedulers will be calling to schedule echo and appointment with Dr.McLean.

## 2012-12-30 ENCOUNTER — Other Ambulatory Visit: Payer: Self-pay | Admitting: *Deleted

## 2012-12-30 DIAGNOSIS — I255 Ischemic cardiomyopathy: Secondary | ICD-10-CM

## 2012-12-30 MED ORDER — CARVEDILOL 6.25 MG PO TABS: 6.2500 mg | ORAL_TABLET | Freq: Two times a day (BID) | ORAL | Status: DC

## 2012-12-30 MED ORDER — LISINOPRIL 10 MG PO TABS: 10.0000 mg | ORAL_TABLET | Freq: Every day | ORAL | Status: DC

## 2012-12-30 NOTE — Telephone Encounter (Signed)
Follow-up:    Patient called in needing a refill of his carvedilol (COREG) 6.25 MG tablet and lisinopril (PRINIVIL,ZESTRIL) 10 MG tablet sent to the [facility] pharmacy listed on his profile.  Patient claims that pharmacy never received the request sent in on 12/29/12.  Please call back when the order has been placed.

## 2013-01-04 ENCOUNTER — Ambulatory Visit (HOSPITAL_COMMUNITY): Payer: Medicare Other | Attending: Cardiology

## 2013-01-04 ENCOUNTER — Other Ambulatory Visit (HOSPITAL_COMMUNITY): Payer: Medicare Other

## 2013-01-04 DIAGNOSIS — I2510 Atherosclerotic heart disease of native coronary artery without angina pectoris: Secondary | ICD-10-CM | POA: Diagnosis not present

## 2013-01-04 DIAGNOSIS — I059 Rheumatic mitral valve disease, unspecified: Secondary | ICD-10-CM | POA: Diagnosis not present

## 2013-01-04 DIAGNOSIS — E119 Type 2 diabetes mellitus without complications: Secondary | ICD-10-CM | POA: Insufficient documentation

## 2013-01-04 DIAGNOSIS — I2589 Other forms of chronic ischemic heart disease: Secondary | ICD-10-CM

## 2013-01-04 DIAGNOSIS — I255 Ischemic cardiomyopathy: Secondary | ICD-10-CM

## 2013-01-04 DIAGNOSIS — I10 Essential (primary) hypertension: Secondary | ICD-10-CM | POA: Diagnosis not present

## 2013-01-04 DIAGNOSIS — I509 Heart failure, unspecified: Secondary | ICD-10-CM | POA: Diagnosis not present

## 2013-01-04 DIAGNOSIS — I369 Nonrheumatic tricuspid valve disorder, unspecified: Secondary | ICD-10-CM | POA: Insufficient documentation

## 2013-01-04 DIAGNOSIS — I379 Nonrheumatic pulmonary valve disorder, unspecified: Secondary | ICD-10-CM | POA: Insufficient documentation

## 2013-01-04 NOTE — Progress Notes (Signed)
Echocardiogram performed.  

## 2013-01-11 ENCOUNTER — Ambulatory Visit (INDEPENDENT_AMBULATORY_CARE_PROVIDER_SITE_OTHER): Payer: Medicare Other | Admitting: Cardiology

## 2013-01-11 ENCOUNTER — Encounter: Payer: Self-pay | Admitting: *Deleted

## 2013-01-11 ENCOUNTER — Encounter: Payer: Self-pay | Admitting: Cardiology

## 2013-01-11 VITALS — BP 120/84 | HR 82 | Ht 70.0 in | Wt 231.0 lb

## 2013-01-11 DIAGNOSIS — E785 Hyperlipidemia, unspecified: Secondary | ICD-10-CM | POA: Diagnosis not present

## 2013-01-11 DIAGNOSIS — I509 Heart failure, unspecified: Secondary | ICD-10-CM

## 2013-01-11 DIAGNOSIS — R0602 Shortness of breath: Secondary | ICD-10-CM

## 2013-01-11 DIAGNOSIS — I5022 Chronic systolic (congestive) heart failure: Secondary | ICD-10-CM | POA: Diagnosis not present

## 2013-01-11 DIAGNOSIS — I2510 Atherosclerotic heart disease of native coronary artery without angina pectoris: Secondary | ICD-10-CM

## 2013-01-11 LAB — BASIC METABOLIC PANEL
BUN: 19 mg/dL (ref 6–23)
CO2: 25 mEq/L (ref 19–32)
Chloride: 108 mEq/L (ref 96–112)
Creatinine, Ser: 1.1 mg/dL (ref 0.4–1.5)
Glucose, Bld: 158 mg/dL — ABNORMAL HIGH (ref 70–99)
Potassium: 4.5 mEq/L (ref 3.5–5.1)

## 2013-01-11 LAB — CBC WITH DIFFERENTIAL/PLATELET
Eosinophils Absolute: 0.4 10*3/uL (ref 0.0–0.7)
Eosinophils Relative: 5.4 % — ABNORMAL HIGH (ref 0.0–5.0)
HCT: 40.7 % (ref 39.0–52.0)
Lymphs Abs: 2.1 10*3/uL (ref 0.7–4.0)
MCHC: 33.3 g/dL (ref 30.0–36.0)
MCV: 82.2 fl (ref 78.0–100.0)
Monocytes Absolute: 0.6 10*3/uL (ref 0.1–1.0)
Neutrophils Relative %: 57.8 % (ref 43.0–77.0)
Platelets: 230 10*3/uL (ref 150.0–400.0)
RDW: 15.1 % — ABNORMAL HIGH (ref 11.5–14.6)
WBC: 7.4 10*3/uL (ref 4.5–10.5)

## 2013-01-11 MED ORDER — LISINOPRIL 20 MG PO TABS: 20.0000 mg | ORAL_TABLET | Freq: Every day | ORAL | Status: DC

## 2013-01-11 MED ORDER — CARVEDILOL 12.5 MG PO TABS: 12.5000 mg | ORAL_TABLET | Freq: Two times a day (BID) | ORAL | Status: DC

## 2013-01-11 MED ORDER — ROSUVASTATIN CALCIUM 10 MG PO TABS: 10.0000 mg | ORAL_TABLET | Freq: Every day | ORAL | Status: DC

## 2013-01-11 NOTE — Progress Notes (Signed)
Patient ID: Wayne [name], male   DOB: January 01, 1941, 72 y.o.   MRN: [id number] PCP: Dr. Caryl Never  72 yo with history of DM, HTN, CAD, and ischemic cardiomyopathy presents for cardiology followup. Patient had a CHF exacerbation in 2/12 and was found to have LV systolic dysfunction with EF around 20%. LHC showed RCA, ramus, and CFX disease. RCA was subtotally occluded. Cardiac MRI showed that all walls, including the inferior wall, should be viable. Patient therefore underwent opening of his chronic totally occluded RCA as well as PCI to the ramus in 2/12. He received drug eluting stents and was on Effient.  He had an echo in 2/13 that showed EF improved to 45% with moderate LV dilation and mild LV hypertrophy.   In 5/13, he developed a large left lower leg hematoma.  He was still on Effient at that time.  ASA and Effient were stopped.  The hematoma did not resolve.  In 7/13, he was re-admitted with septic shock from MSSA from an abscess in his left gastrocnemius.  He also grew Pseudomonas from the left gastrocnemius as well.  He had a prolonged course in the hospital and later in a rehab facility.  Ultimately, he got back home again and has been doing fairly well.  I had him get an echo in 1/14, and this showed EF 15% with diffuse hypokinesis and inferoposterior akinesis.  He had been off of most of his prior cardiac medications.  He is only taking ASA 81, Coreg, and lisinopril.    Symptomatically, he denies exertional dyspnea when walking on flat ground and can get to the top of a flight of steps.  His weight has gone back up to his prior weight before his prolonged hospitalization.  He does fatigue easily.  No chest pain.    ECG: NSR, LVH, possible inferior MI  Labs (3/12): TSH normal, ESR normal, HDL 34, LDL 77, LFTs normal, K 4.3, creatinine 0.9  Labs (4/12): K 4, creatinine 0.9, BNP 234 Labs (6/12): K 3.8, creatinine 0.72 Labs (9/12): K 4.4, creatinine 0.9, BNP 145, digoxin 0.8, hgbA1c 7.8 Labs  (10/12): K 4, creatinine 0.7 Labs (2/13): K 4.5, creatinine 0.9, LDL 116, HDL 42 Labs (8/13): K 3.6, creatinine 1.25  ECG: NSR, LVH, old inferior MI  Past Medical History:  1. HYPERTENSION  2. HYPERLIPIDEMIA  3. ECZEMA  4. RHINITIS  5. HERPES ZOSTER OPHTHALMICUS  6. ADENOCARCINOMA, PROSTATE: Status post prostatectomy in 2009. Has had some incontinence since then.  7. Diabetes mellitus type II  8. Arthritis  9. Obesity  10. GERD: rare  11. CAD: Presented with exertional dyspnea, never had chest pain. LHC (2/12) with subtotalled proximal RCA and left to right collaterals, 90% proximal moderate-sized ramus, 95% proximal relatively small CFX, 40-50% proximal LAD. Cardiac MRI (2/12) showed EF 21%, some mild scar in basal segments but all wall segments would be expected to be viable.        a. DES x 5 (overlapping) to RCA 01/30/11        b. DES x 1 to RI 01/30/11  12. Ischemic CMP: Echo (2/12) with moderately dilated LV, EF about 20% with diffuse hypokinesis and inferior akinesis, pseudonormal diastolic function, mild MR, severe LAE, mildly decreased RV systolic function. RHC (2/12) with mean RA 12, PA 40/25, mean PCWP 26, CI 2.1.  Echo (5/12) with EF 40% (appeared worse to my eye) with posterior HK, basal inferior AK, inferoseptal AK, basal anteroseptal AK, mild MR.  Cardiac MRI was repeated  and showed EF 32% (improved from 21%) and mild LV dilation (was severely dilated before) with diffuse hypokinesis and subendocardial scar in the basal inferior, basal posterior, and basal anterolateral segments.  Echo (2/13) with EF 45%, moderate LV dilation, mild LVH.  Echo (1/14) with EF 15%, diffuse hypokinesis, inferoposterior akinesis, mild MR, grade I diastolic dysfunction.  13. Cervical OA.  14. Polymyalgia rheumatica 15. OSA: Severe on sleep study 10/12.  On CPAP.  16. Left lower leg hematoma in setting of Effient use.  He developed a left gastrocnemius abscess and septic shock with prolonged  hospitalization beginning in 7/13.   Family History:  Father died with MI at age 77. He was an alcoholic. Mother died with cancer at around 67.   Social History:  Occupation: retired [employer] architect  Married, lives in [place]  Past smoker, quit around 1990   ROS: all systems reviewed and negative except as per HPI.    Current Outpatient Prescriptions  Medication Sig Dispense Refill  . aspirin EC 81 MG tablet Take 81 mg by mouth daily.      . carvedilol (COREG) 12.5 MG tablet Take 1 tablet (12.5 mg total) by mouth 2 (two) times daily.  60 tablet  6  . lisinopril (PRINIVIL,ZESTRIL) 20 MG tablet Take 1 tablet (20 mg total) by mouth daily.  30 tablet  6  . rosuvastatin (CRESTOR) 10 MG tablet Take 1 tablet (10 mg total) by mouth daily.  30 tablet  3    BP 120/84  Pulse 82  Ht 5\' 10"  (1.778 m)  Wt 231 lb (104.781 kg)  BMI 33.15 kg/m2 General: NAD, obese.  Neck: Thick, JVP 7 cm, no thyromegaly or thyroid nodule.  Lungs: Clear to auscultation bilaterally with normal respiratory effort.  CV: Nondisplaced PMI. Heart regular S1/S2, no S3/S4, no murmur. 1+ ankle edema. No carotid bruit. Normal pedal pulses.  Abdomen: Soft, nontender, no hepatosplenomegaly, no distention.  Neurologic: Alert and oriented x 3.  Psych: Normal affect.  Extremities: No clubbing or cyanosis.   Assessment/Plan:  CORONARY ATHEROSCLEROSIS NATIVE CORONARY ARTERY  No chest pain but he has had a dramatic fall in his EF compared to 2/13 (45% => 15%).  I am concerned that he may have had re-occlusion of his RCA during his complicated medical course this year.  He has been off Effient due to the left leg hematoma and was off aspirin as well for a long period (though this has been started back).   - I will do a left heart cath this week to re-assess his coronaries for worsening disease.   - Continue ASA 81 and restart Crestor 10 mg daily.  He had myalgias with Lipitor but was able to take Crestor.   HYPERLIPIDEMIA  As  above, restarting Crestor.  Will check lipids/LFTs in 2 months.  Systolic CHF, chronic EF is considerably worsened compared to prior echo (down to 15% from 45%).  He is near-euvolemic on exam, NYHA class II symptoms. As above, I will assess with LHC.   - No indication for Lasix at this point.  - Increase Coreg to 12.5 mg bid.  - He has been taking lisinopril 20 mg daily rather than the 10 mg daily that was prescribed.  Continue lisinopril at the 20 mg daily dose.   I will see him back 2 wks after cath.   Marca Ancona 01/11/2013

## 2013-01-11 NOTE — Patient Instructions (Addendum)
Take lisinopril 20mg  daily.   Increase coreg (carvedilol) to 12.5mg  two times a day. You can take 2 of your 6.25mg  tablets two times a day and use your current supply.  Take crestor 10mg  daily.   Your physician recommends that you return for lab work today--BMET/BNP/CBCd/PT/INR.  Your physician has requested that you have a cardiac catheterization. Cardiac catheterization is used to diagnose and/or treat various heart conditions. Doctors may recommend this procedure for a number of different reasons. The most common reason is to evaluate chest pain. Chest pain can be a symptom of coronary artery disease (CAD), and cardiac catheterization can show whether plaque is narrowing or blocking your heart's arteries. This procedure is also used to evaluate the valves, as well as measure the blood flow and oxygen levels in different parts of your heart. For further information please visit [email or web address] Please follow instruction sheet, as given. Thursday January 30,2014.   Your physician recommends that you schedule a follow-up appointment in: 2-3 weeks with Dr Shirlee Latch.  Marland Kitchen

## 2013-01-12 ENCOUNTER — Other Ambulatory Visit: Payer: Self-pay | Admitting: Cardiology

## 2013-01-12 DIAGNOSIS — I255 Ischemic cardiomyopathy: Secondary | ICD-10-CM

## 2013-01-12 LAB — PROTIME-INR: Prothrombin Time: 11 s (ref 10.2–12.4)

## 2013-01-13 ENCOUNTER — Other Ambulatory Visit (HOSPITAL_COMMUNITY): Payer: Medicare Other

## 2013-01-13 ENCOUNTER — Inpatient Hospital Stay (HOSPITAL_BASED_OUTPATIENT_CLINIC_OR_DEPARTMENT_OTHER)
Admission: RE | Admit: 2013-01-13 | Discharge: 2013-01-13 | Disposition: A | Discharge status: Home / Self Care | Payer: Medicare Other | Source: Ambulatory Visit | Attending: Cardiology | Admitting: Cardiology

## 2013-01-13 ENCOUNTER — Encounter (HOSPITAL_BASED_OUTPATIENT_CLINIC_OR_DEPARTMENT_OTHER)
Admission: RE | Disposition: A | Discharge status: Home / Self Care | Payer: Self-pay | Source: Ambulatory Visit | Attending: Cardiology

## 2013-01-13 DIAGNOSIS — I5020 Unspecified systolic (congestive) heart failure: Secondary | ICD-10-CM | POA: Insufficient documentation

## 2013-01-13 DIAGNOSIS — I509 Heart failure, unspecified: Secondary | ICD-10-CM | POA: Insufficient documentation

## 2013-01-13 DIAGNOSIS — I255 Ischemic cardiomyopathy: Secondary | ICD-10-CM

## 2013-01-13 DIAGNOSIS — K219 Gastro-esophageal reflux disease without esophagitis: Secondary | ICD-10-CM | POA: Insufficient documentation

## 2013-01-13 DIAGNOSIS — I2510 Atherosclerotic heart disease of native coronary artery without angina pectoris: Secondary | ICD-10-CM | POA: Diagnosis not present

## 2013-01-13 DIAGNOSIS — E785 Hyperlipidemia, unspecified: Secondary | ICD-10-CM | POA: Insufficient documentation

## 2013-01-13 DIAGNOSIS — E119 Type 2 diabetes mellitus without complications: Secondary | ICD-10-CM | POA: Insufficient documentation

## 2013-01-13 DIAGNOSIS — Z9861 Coronary angioplasty status: Secondary | ICD-10-CM | POA: Insufficient documentation

## 2013-01-13 DIAGNOSIS — I10 Essential (primary) hypertension: Secondary | ICD-10-CM | POA: Insufficient documentation

## 2013-01-13 DIAGNOSIS — R6889 Other general symptoms and signs: Secondary | ICD-10-CM

## 2013-01-13 DIAGNOSIS — E669 Obesity, unspecified: Secondary | ICD-10-CM | POA: Insufficient documentation

## 2013-01-13 DIAGNOSIS — I2589 Other forms of chronic ischemic heart disease: Secondary | ICD-10-CM | POA: Diagnosis not present

## 2013-01-13 LAB — POCT I-STAT GLUCOSE
Glucose, Bld: 208 mg/dL — ABNORMAL HIGH (ref 70–99)
Operator id: 141321

## 2013-01-13 SURGERY — JV LEFT HEART CATHETERIZATION WITH CORONARY ANGIOGRAM
Anesthesia: Moderate Sedation

## 2013-01-13 MED ORDER — SODIUM CHLORIDE 0.9 % IJ SOLN: 3.0000 mL | INTRAMUSCULAR | Status: DC | PRN

## 2013-01-13 MED ORDER — ACETAMINOPHEN 325 MG PO TABS: 650.0000 mg | ORAL_TABLET | ORAL | Status: DC | PRN

## 2013-01-13 MED ORDER — SODIUM CHLORIDE 0.9 % IV SOLN: INTRAVENOUS | Status: DC

## 2013-01-13 MED ORDER — SODIUM CHLORIDE 0.9 % IV SOLN: INTRAVENOUS | Status: AC

## 2013-01-13 MED ORDER — SODIUM CHLORIDE 0.9 % IJ SOLN: 3.0000 mL | Freq: Two times a day (BID) | INTRAMUSCULAR | Status: DC

## 2013-01-13 MED ORDER — ONDANSETRON HCL 4 MG/2ML IJ SOLN: 4.0000 mg | Freq: Four times a day (QID) | INTRAMUSCULAR | Status: DC | PRN

## 2013-01-13 MED ORDER — SODIUM CHLORIDE 0.9 % IV SOLN: 250.0000 mL | INTRAVENOUS | Status: DC | PRN

## 2013-01-13 MED ORDER — ASPIRIN 81 MG PO CHEW
324.0000 mg | CHEWABLE_TABLET | ORAL | Status: AC
  Administered 2013-01-13: 324 mg via ORAL

## 2013-01-13 NOTE — OR Nursing (Signed)
Tegaderm dressing applied, site level 0, bedrest begins at 0910 

## 2013-01-13 NOTE — OR Nursing (Signed)
Discharge instructions reviewed and signed, pt stated understanding, ambulated in hall without difficulty, site level 0, transported to wife's car via wheelchair 

## 2013-01-13 NOTE — H&P (View-Only) (Signed)
Patient ID: Wayne [name], male   DOB: 02/25/1941, 71 y.o.   MRN: [id number] PCP: Dr. Burchette  71 yo with history of DM, HTN, CAD, and ischemic cardiomyopathy presents for cardiology followup. Patient had a CHF exacerbation in 2/12 and was found to have LV systolic dysfunction with EF around 20%. LHC showed RCA, ramus, and CFX disease. RCA was subtotally occluded. Cardiac MRI showed that all walls, including the inferior wall, should be viable. Patient therefore underwent opening of his chronic totally occluded RCA as well as PCI to the ramus in 2/12. He received drug eluting stents and was on Effient.  He had an echo in 2/13 that showed EF improved to 45% with moderate LV dilation and mild LV hypertrophy.   In 5/13, he developed a large left lower leg hematoma.  He was still on Effient at that time.  ASA and Effient were stopped.  The hematoma did not resolve.  In 7/13, he was re-admitted with septic shock from MSSA from an abscess in his left gastrocnemius.  He also grew Pseudomonas from the left gastrocnemius as well.  He had a prolonged course in the hospital and later in a rehab facility.  Ultimately, he got back home again and has been doing fairly well.  I had him get an echo in 1/14, and this showed EF 15% with diffuse hypokinesis and inferoposterior akinesis.  He had been off of most of his prior cardiac medications.  He is only taking ASA 81, Coreg, and lisinopril.    Symptomatically, he denies exertional dyspnea when walking on flat ground and can get to the top of a flight of steps.  His weight has gone back up to his prior weight before his prolonged hospitalization.  He does fatigue easily.  No chest pain.    ECG: NSR, LVH, possible inferior MI  Labs (3/12): TSH normal, ESR normal, HDL 34, LDL 77, LFTs normal, K 4.3, creatinine 0.9  Labs (4/12): K 4, creatinine 0.9, BNP 234 Labs (6/12): K 3.8, creatinine 0.72 Labs (9/12): K 4.4, creatinine 0.9, BNP 145, digoxin 0.8, hgbA1c 7.8 Labs  (10/12): K 4, creatinine 0.7 Labs (2/13): K 4.5, creatinine 0.9, LDL 116, HDL 42 Labs (8/13): K 3.6, creatinine 1.25  ECG: NSR, LVH, old inferior MI  Past Medical History:  1. HYPERTENSION  2. HYPERLIPIDEMIA  3. ECZEMA  4. RHINITIS  5. HERPES ZOSTER OPHTHALMICUS  6. ADENOCARCINOMA, PROSTATE: Status post prostatectomy in 2009. Has had some incontinence since then.  7. Diabetes mellitus type II  8. Arthritis  9. Obesity  10. GERD: rare  11. CAD: Presented with exertional dyspnea, never had chest pain. LHC (2/12) with subtotalled proximal RCA and left to right collaterals, 90% proximal moderate-sized ramus, 95% proximal relatively small CFX, 40-50% proximal LAD. Cardiac MRI (2/12) showed EF 21%, some mild scar in basal segments but all wall segments would be expected to be viable.        a. DES x 5 (overlapping) to RCA 01/30/11        b. DES x 1 to RI 01/30/11  12. Ischemic CMP: Echo (2/12) with moderately dilated LV, EF about 20% with diffuse hypokinesis and inferior akinesis, pseudonormal diastolic function, mild MR, severe LAE, mildly decreased RV systolic function. RHC (2/12) with mean RA 12, PA 40/25, mean PCWP 26, CI 2.1.  Echo (5/12) with EF 40% (appeared worse to my eye) with posterior HK, basal inferior AK, inferoseptal AK, basal anteroseptal AK, mild MR.  Cardiac MRI was repeated   and showed EF 32% (improved from 21%) and mild LV dilation (was severely dilated before) with diffuse hypokinesis and subendocardial scar in the basal inferior, basal posterior, and basal anterolateral segments.  Echo (2/13) with EF 45%, moderate LV dilation, mild LVH.  Echo (1/14) with EF 15%, diffuse hypokinesis, inferoposterior akinesis, mild MR, grade I diastolic dysfunction.  13. Cervical OA.  14. Polymyalgia rheumatica 15. OSA: Severe on sleep study 10/12.  On CPAP.  16. Left lower leg hematoma in setting of Effient use.  He developed a left gastrocnemius abscess and septic shock with prolonged  hospitalization beginning in 7/13.   Family History:  Father died with MI at age 61. He was an alcoholic. Mother died with cancer at around 80.   Social History:  Occupation: retired truck driver  Married, lives in [place]  Past smoker, quit around 1990   ROS: all systems reviewed and negative except as per HPI.    Current Outpatient Prescriptions  Medication Sig Dispense Refill  . aspirin EC 81 MG tablet Take 81 mg by mouth daily.      . carvedilol (COREG) 12.5 MG tablet Take 1 tablet (12.5 mg total) by mouth 2 (two) times daily.  60 tablet  6  . lisinopril (PRINIVIL,ZESTRIL) 20 MG tablet Take 1 tablet (20 mg total) by mouth daily.  30 tablet  6  . rosuvastatin (CRESTOR) 10 MG tablet Take 1 tablet (10 mg total) by mouth daily.  30 tablet  3    BP 120/84  Pulse 82  Ht 5' 10" (1.778 m)  Wt 231 lb (104.781 kg)  BMI 33.15 kg/m2 General: NAD, obese.  Neck: Thick, JVP 7 cm, no thyromegaly or thyroid nodule.  Lungs: Clear to auscultation bilaterally with normal respiratory effort.  CV: Nondisplaced PMI. Heart regular S1/S2, no S3/S4, no murmur. 1+ ankle edema. No carotid bruit. Normal pedal pulses.  Abdomen: Soft, nontender, no hepatosplenomegaly, no distention.  Neurologic: Alert and oriented x 3.  Psych: Normal affect.  Extremities: No clubbing or cyanosis.   Assessment/Plan:  CORONARY ATHEROSCLEROSIS NATIVE CORONARY ARTERY  No chest pain but he has had a dramatic fall in his EF compared to 2/13 (45% => 15%).  I am concerned that he may have had re-occlusion of his RCA during his complicated medical course this year.  He has been off Effient due to the left leg hematoma and was off aspirin as well for a long period (though this has been started back).   - I will do a left heart cath this week to re-assess his coronaries for worsening disease.   - Continue ASA 81 and restart Crestor 10 mg daily.  He had myalgias with Lipitor but was able to take Crestor.   HYPERLIPIDEMIA  As  above, restarting Crestor.  Will check lipids/LFTs in 2 months.  Systolic CHF, chronic EF is considerably worsened compared to prior echo (down to 15% from 45%).  He is near-euvolemic on exam, NYHA class II symptoms. As above, I will assess with LHC.   - No indication for Lasix at this point.  - Increase Coreg to 12.5 mg bid.  - He has been taking lisinopril 20 mg daily rather than the 10 mg daily that was prescribed.  Continue lisinopril at the 20 mg daily dose.   I will see him back 2 wks after cath.   Isaias Dowson 01/11/2013  

## 2013-01-13 NOTE — OR Nursing (Signed)
Dr McLean at bedside to discuss results and treatment plan with pt and family 

## 2013-01-13 NOTE — Interval H&P Note (Signed)
History and Physical Interval Note:  01/13/2013 7:58 AM  Wayne [name]  has presented today for surgery, with the diagnosis of cp  The various methods of treatment have been discussed with the patient and family. After consideration of risks, benefits and other options for treatment, the patient has consented to  Procedure(s) (LRB) with comments: JV LEFT HEART CATHETERIZATION WITH CORONARY ANGIOGRAM (N/A) as a surgical intervention .  The patient's history has been reviewed, patient examined, no change in status, stable for surgery.  I have reviewed the patient's chart and labs.  Questions were answered to the patient's satisfaction.     Damare Serano [employer]

## 2013-01-13 NOTE — CV Procedure (Addendum)
   Cardiac Catheterization Procedure Note  Name: Wayne [name] MRN: [id number] DOB: 07/20/1941  Procedure: Left Heart Cath, Selective Coronary Angiography, LV angiography  Indication: Cardiomyopathy, EF has fallen from 45% to 15%.  Known CAD with RCA and ramus PCI in the past.    Procedural details: The right groin was prepped, draped, and anesthetized with 1% lidocaine. Using modified Seldinger technique, a 4 French sheath was introduced into the right femoral artery. Standard Judkins catheters were used for coronary angiography and left ventriculography. Catheter exchanges were performed over a guidewire. There were no immediate procedural complications. The patient was transferred to the post catheterization recovery area for further monitoring.  Procedural Findings: Hemodynamics:  AO 100/55 LV 121/20   Coronary angiography: Coronary dominance: right  Left mainstem: 40% distal left main stenosis.   Left anterior descending (LAD): 30% proximal LAD stenosis. Luminal irregularities throughout.   Left circumflex (LCx): Large ramus with patent stent in the proximal portion of the vessel. The AV LCx is a small vessel.  Subtotal occlusion, progressive from prior study.   Right coronary artery (RCA): The RCA is covered with overlapping stents from the ostium to the distal vessel.  There was damping of the pressure waveform with engagement of the RCA.  There was significant up to 95% ostial stenosis in the RCA, within stent.  The remainder of the RCA had minimal disease.  There were faint collaterals from the RCA to the territory of the AV LCx.  TIMI 3 flow in RCA.  Left ventriculography: EF is around 20% (difficult with PVCs).  The inferior wall is akinetic.   Final Conclusions:  LAD and large ramus patent.  The AV LCx covered a small territory and is diffusely diseased.  The RCA has multiple overlapping stents from the ostium to the distal vessel with damping upon engagement of the vessel.   There is 95% ostial stenosis.  The inferior wall is akinetic.    Recommendations: Discussed films with Dr. Excell Seltzer.  Hopefully a short ostial DES would open the RCA.  Given the significant fall in EF but good flow down RCA, I think that this is warranted.  I will start Plavix prior to procedure and we will check P2Y12 level prior to PCI to make sure he is not a Plavix hyper-responder. I think that Plavix will be unlikely to cause him the bleeding problems that he had with Effient.  Discussed with wife and patient, PCI scheduled 2/10 with Dr. Excell Seltzer.  Marca Ancona 01/13/2013, 8:58 AM

## 2013-01-13 NOTE — OR Nursing (Signed)
Meal served 

## 2013-01-14 ENCOUNTER — Encounter (HOSPITAL_COMMUNITY): Payer: Self-pay | Admitting: Pharmacy Technician

## 2013-01-21 ENCOUNTER — Ambulatory Visit (INDEPENDENT_AMBULATORY_CARE_PROVIDER_SITE_OTHER): Payer: Medicare Other | Admitting: *Deleted

## 2013-01-21 DIAGNOSIS — I2510 Atherosclerotic heart disease of native coronary artery without angina pectoris: Secondary | ICD-10-CM | POA: Diagnosis not present

## 2013-01-21 LAB — BASIC METABOLIC PANEL
BUN: 19 mg/dL (ref 6–23)
CO2: 22 mEq/L (ref 19–32)
Chloride: 109 mEq/L (ref 96–112)
Creatinine, Ser: 1.1 mg/dL (ref 0.4–1.5)

## 2013-01-21 LAB — CBC WITH DIFFERENTIAL/PLATELET
Basophils Absolute: 0 10*3/uL (ref 0.0–0.1)
Eosinophils Absolute: 0.4 10*3/uL (ref 0.0–0.7)
HCT: 35.7 % — ABNORMAL LOW (ref 39.0–52.0)
Hemoglobin: 12.1 g/dL — ABNORMAL LOW (ref 13.0–17.0)
Lymphs Abs: 1.3 10*3/uL (ref 0.7–4.0)
MCHC: 33.9 g/dL (ref 30.0–36.0)
MCV: 81.3 fl (ref 78.0–100.0)
Monocytes Absolute: 0.5 10*3/uL (ref 0.1–1.0)
Neutro Abs: 3.2 10*3/uL (ref 1.4–7.7)
RDW: 14.7 % — ABNORMAL HIGH (ref 11.5–14.6)

## 2013-01-24 ENCOUNTER — Ambulatory Visit (HOSPITAL_COMMUNITY)
Admission: RE | Admit: 2013-01-24 | Discharge: 2013-01-25 | Disposition: A | Discharge status: Home / Self Care | Payer: Medicare Other | Source: Ambulatory Visit | Attending: Cardiovascular Disease | Admitting: Cardiovascular Disease

## 2013-01-24 ENCOUNTER — Encounter (HOSPITAL_COMMUNITY): Payer: Self-pay | Admitting: General Practice

## 2013-01-24 ENCOUNTER — Encounter (HOSPITAL_COMMUNITY)
Admission: RE | Disposition: A | Discharge status: Home / Self Care | Payer: Self-pay | Source: Ambulatory Visit | Attending: Cardiovascular Disease

## 2013-01-24 DIAGNOSIS — K219 Gastro-esophageal reflux disease without esophagitis: Secondary | ICD-10-CM | POA: Insufficient documentation

## 2013-01-24 DIAGNOSIS — E785 Hyperlipidemia, unspecified: Secondary | ICD-10-CM | POA: Diagnosis not present

## 2013-01-24 DIAGNOSIS — Z955 Presence of coronary angioplasty implant and graft: Secondary | ICD-10-CM

## 2013-01-24 DIAGNOSIS — I2510 Atherosclerotic heart disease of native coronary artery without angina pectoris: Secondary | ICD-10-CM | POA: Diagnosis not present

## 2013-01-24 DIAGNOSIS — C61 Malignant neoplasm of prostate: Secondary | ICD-10-CM | POA: Diagnosis not present

## 2013-01-24 DIAGNOSIS — M353 Polymyalgia rheumatica: Secondary | ICD-10-CM | POA: Diagnosis not present

## 2013-01-24 DIAGNOSIS — I429 Cardiomyopathy, unspecified: Secondary | ICD-10-CM

## 2013-01-24 DIAGNOSIS — E109 Type 1 diabetes mellitus without complications: Secondary | ICD-10-CM | POA: Insufficient documentation

## 2013-01-24 DIAGNOSIS — I10 Essential (primary) hypertension: Secondary | ICD-10-CM | POA: Insufficient documentation

## 2013-01-24 DIAGNOSIS — I509 Heart failure, unspecified: Secondary | ICD-10-CM | POA: Insufficient documentation

## 2013-01-24 DIAGNOSIS — Z9119 Patient's noncompliance with other medical treatment and regimen: Secondary | ICD-10-CM | POA: Insufficient documentation

## 2013-01-24 DIAGNOSIS — Z91199 Patient's noncompliance with other medical treatment and regimen due to unspecified reason: Secondary | ICD-10-CM | POA: Insufficient documentation

## 2013-01-24 DIAGNOSIS — I5042 Chronic combined systolic (congestive) and diastolic (congestive) heart failure: Secondary | ICD-10-CM | POA: Insufficient documentation

## 2013-01-24 DIAGNOSIS — I2589 Other forms of chronic ischemic heart disease: Secondary | ICD-10-CM | POA: Diagnosis not present

## 2013-01-24 DIAGNOSIS — Z9861 Coronary angioplasty status: Secondary | ICD-10-CM | POA: Diagnosis not present

## 2013-01-24 DIAGNOSIS — G4733 Obstructive sleep apnea (adult) (pediatric): Secondary | ICD-10-CM | POA: Insufficient documentation

## 2013-01-24 DIAGNOSIS — I214 Non-ST elevation (NSTEMI) myocardial infarction: Secondary | ICD-10-CM

## 2013-01-24 HISTORY — PX: CARDIAC CATHETERIZATION: SHX172

## 2013-01-24 HISTORY — DX: Type 2 diabetes mellitus without complications: E11.9

## 2013-01-24 HISTORY — DX: Acute myocardial infarction, unspecified: I21.9

## 2013-01-24 HISTORY — PX: PERCUTANEOUS CORONARY STENT INTERVENTION (PCI-S): SHX5485

## 2013-01-24 LAB — GLUCOSE, CAPILLARY
Glucose-Capillary: 201 mg/dL — ABNORMAL HIGH (ref 70–99)
Glucose-Capillary: 157 mg/dL — ABNORMAL HIGH (ref 70–99)

## 2013-01-24 LAB — HEMOGLOBIN A1C: Mean Plasma Glucose: 186 mg/dL — ABNORMAL HIGH (ref <117)

## 2013-01-24 SURGERY — PERCUTANEOUS CORONARY STENT INTERVENTION (PCI-S)
Anesthesia: LOCAL

## 2013-01-24 MED ORDER — CARVEDILOL 12.5 MG PO TABS
12.5000 mg | ORAL_TABLET | Freq: Two times a day (BID) | ORAL | Status: DC
  Administered 2013-01-24 – 2013-01-25 (×2): 12.5 mg via ORAL
  Filled 2013-01-24 (×3): qty 1

## 2013-01-24 MED ORDER — ASPIRIN 81 MG PO CHEW
324.0000 mg | CHEWABLE_TABLET | ORAL | Status: AC
  Administered 2013-01-24: 324 mg via ORAL

## 2013-01-24 MED ORDER — ASPIRIN EC 81 MG PO TBEC
81.0000 mg | DELAYED_RELEASE_TABLET | Freq: Every day | ORAL | Status: DC
  Administered 2013-01-25: 81 mg via ORAL
  Filled 2013-01-24: qty 1

## 2013-01-24 MED ORDER — ASPIRIN 81 MG PO CHEW
CHEWABLE_TABLET | ORAL | Status: AC
  Filled 2013-01-24: qty 4

## 2013-01-24 MED ORDER — LIDOCAINE HCL (PF) 1 % IJ SOLN
INTRAMUSCULAR | Status: AC
  Filled 2013-01-24: qty 30

## 2013-01-24 MED ORDER — NITROGLYCERIN 1 MG/10 ML FOR IR/CATH LAB
INTRA_ARTERIAL | Status: AC
  Filled 2013-01-24: qty 10

## 2013-01-24 MED ORDER — SODIUM CHLORIDE 0.9 % IV SOLN: 250.0000 mL | INTRAVENOUS | Status: DC | PRN

## 2013-01-24 MED ORDER — MIDAZOLAM HCL 2 MG/2ML IJ SOLN
INTRAMUSCULAR | Status: AC
  Filled 2013-01-24: qty 2

## 2013-01-24 MED ORDER — SODIUM CHLORIDE 0.45 % IV SOLN
INTRAVENOUS | Status: AC
  Administered 2013-01-24: 15:00:00 via INTRAVENOUS

## 2013-01-24 MED ORDER — INSULIN ASPART 100 UNIT/ML ~~LOC~~ SOLN: 0.0000 [IU] | Freq: Every day | SUBCUTANEOUS | Status: DC

## 2013-01-24 MED ORDER — SODIUM CHLORIDE 0.9 % IV SOLN
INTRAVENOUS | Status: DC
  Administered 2013-01-24: 09:00:00 via INTRAVENOUS

## 2013-01-24 MED ORDER — LISINOPRIL 20 MG PO TABS
20.0000 mg | ORAL_TABLET | Freq: Every day | ORAL | Status: DC
  Administered 2013-01-25: 20 mg via ORAL
  Filled 2013-01-24: qty 1

## 2013-01-24 MED ORDER — BIVALIRUDIN 250 MG IV SOLR
INTRAVENOUS | Status: AC
  Filled 2013-01-24: qty 250

## 2013-01-24 MED ORDER — CENTRUM SILVER ADULT 50+ PO TABS: 1.0000 | ORAL_TABLET | Freq: Every day | ORAL | Status: DC

## 2013-01-24 MED ORDER — ACETAMINOPHEN 325 MG PO TABS: 650.0000 mg | ORAL_TABLET | ORAL | Status: DC | PRN

## 2013-01-24 MED ORDER — ONDANSETRON HCL 4 MG/2ML IJ SOLN: 4.0000 mg | Freq: Four times a day (QID) | INTRAMUSCULAR | Status: DC | PRN

## 2013-01-24 MED ORDER — FENTANYL CITRATE 0.05 MG/ML IJ SOLN
INTRAMUSCULAR | Status: AC
  Filled 2013-01-24: qty 2

## 2013-01-24 MED ORDER — HEPARIN (PORCINE) IN NACL 2-0.9 UNIT/ML-% IJ SOLN
INTRAMUSCULAR | Status: AC
  Filled 2013-01-24: qty 1000

## 2013-01-24 MED ORDER — SODIUM CHLORIDE 0.9 % IJ SOLN: 3.0000 mL | INTRAMUSCULAR | Status: DC | PRN

## 2013-01-24 MED ORDER — ADULT MULTIVITAMIN W/MINERALS CH
1.0000 | ORAL_TABLET | Freq: Every day | ORAL | Status: DC
  Administered 2013-01-24 – 2013-01-25 (×2): 1 via ORAL
  Filled 2013-01-24 (×2): qty 1

## 2013-01-24 MED ORDER — SODIUM CHLORIDE 0.9 % IJ SOLN: 3.0000 mL | Freq: Two times a day (BID) | INTRAMUSCULAR | Status: DC

## 2013-01-24 MED ORDER — INSULIN ASPART 100 UNIT/ML ~~LOC~~ SOLN
0.0000 [IU] | Freq: Three times a day (TID) | SUBCUTANEOUS | Status: DC
  Administered 2013-01-25: 09:00:00 3 [IU] via SUBCUTANEOUS
  Administered 2013-01-25: 13:00:00 2 [IU] via SUBCUTANEOUS

## 2013-01-24 MED ORDER — CLOPIDOGREL BISULFATE 75 MG PO TABS
75.0000 mg | ORAL_TABLET | Freq: Every day | ORAL | Status: DC
  Administered 2013-01-25: 75 mg via ORAL
  Filled 2013-01-24: qty 1

## 2013-01-24 NOTE — H&P (View-Only) (Signed)
Patient ID: Wayne [name], male   DOB: 04/07/1941, 71 y.o.   MRN: [id number] PCP: Dr. Burchette  71 yo with history of DM, HTN, CAD, and ischemic cardiomyopathy presents for cardiology followup. Patient had a CHF exacerbation in 2/12 and was found to have LV systolic dysfunction with EF around 20%. LHC showed RCA, ramus, and CFX disease. RCA was subtotally occluded. Cardiac MRI showed that all walls, including the inferior wall, should be viable. Patient therefore underwent opening of his chronic totally occluded RCA as well as PCI to the ramus in 2/12. He received drug eluting stents and was on Effient.  He had an echo in 2/13 that showed EF improved to 45% with moderate LV dilation and mild LV hypertrophy.   In 5/13, he developed a large left lower leg hematoma.  He was still on Effient at that time.  ASA and Effient were stopped.  The hematoma did not resolve.  In 7/13, he was re-admitted with septic shock from MSSA from an abscess in his left gastrocnemius.  He also grew Pseudomonas from the left gastrocnemius as well.  He had a prolonged course in the hospital and later in a rehab facility.  Ultimately, he got back home again and has been doing fairly well.  I had him get an echo in 1/14, and this showed EF 15% with diffuse hypokinesis and inferoposterior akinesis.  He had been off of most of his prior cardiac medications.  He is only taking ASA 81, Coreg, and lisinopril.    Symptomatically, he denies exertional dyspnea when walking on flat ground and can get to the top of a flight of steps.  His weight has gone back up to his prior weight before his prolonged hospitalization.  He does fatigue easily.  No chest pain.    ECG: NSR, LVH, possible inferior MI  Labs (3/12): TSH normal, ESR normal, HDL 34, LDL 77, LFTs normal, K 4.3, creatinine 0.9  Labs (4/12): K 4, creatinine 0.9, BNP 234 Labs (6/12): K 3.8, creatinine 0.72 Labs (9/12): K 4.4, creatinine 0.9, BNP 145, digoxin 0.8, hgbA1c 7.8 Labs  (10/12): K 4, creatinine 0.7 Labs (2/13): K 4.5, creatinine 0.9, LDL 116, HDL 42 Labs (8/13): K 3.6, creatinine 1.25  ECG: NSR, LVH, old inferior MI  Past Medical History:  1. HYPERTENSION  2. HYPERLIPIDEMIA  3. ECZEMA  4. RHINITIS  5. HERPES ZOSTER OPHTHALMICUS  6. ADENOCARCINOMA, PROSTATE: Status post prostatectomy in 2009. Has had some incontinence since then.  7. Diabetes mellitus type II  8. Arthritis  9. Obesity  10. GERD: rare  11. CAD: Presented with exertional dyspnea, never had chest pain. LHC (2/12) with subtotalled proximal RCA and left to right collaterals, 90% proximal moderate-sized ramus, 95% proximal relatively small CFX, 40-50% proximal LAD. Cardiac MRI (2/12) showed EF 21%, some mild scar in basal segments but all wall segments would be expected to be viable.        a. DES x 5 (overlapping) to RCA 01/30/11        b. DES x 1 to RI 01/30/11  12. Ischemic CMP: Echo (2/12) with moderately dilated LV, EF about 20% with diffuse hypokinesis and inferior akinesis, pseudonormal diastolic function, mild MR, severe LAE, mildly decreased RV systolic function. RHC (2/12) with mean RA 12, PA 40/25, mean PCWP 26, CI 2.1.  Echo (5/12) with EF 40% (appeared worse to my eye) with posterior HK, basal inferior AK, inferoseptal AK, basal anteroseptal AK, mild MR.  Cardiac MRI was repeated   and showed EF 32% (improved from 21%) and mild LV dilation (was severely dilated before) with diffuse hypokinesis and subendocardial scar in the basal inferior, basal posterior, and basal anterolateral segments.  Echo (2/13) with EF 45%, moderate LV dilation, mild LVH.  Echo (1/14) with EF 15%, diffuse hypokinesis, inferoposterior akinesis, mild MR, grade I diastolic dysfunction.  13. Cervical OA.  14. Polymyalgia rheumatica 15. OSA: Severe on sleep study 10/12.  On CPAP.  16. Left lower leg hematoma in setting of Effient use.  He developed a left gastrocnemius abscess and septic shock with prolonged  hospitalization beginning in 7/13.   Family History:  Father died with MI at age 61. He was an alcoholic. Mother died with cancer at around 80.   Social History:  Occupation: retired truck driver  Married, lives in [place]  Past smoker, quit around 1990   ROS: all systems reviewed and negative except as per HPI.    Current Outpatient Prescriptions  Medication Sig Dispense Refill  . aspirin EC 81 MG tablet Take 81 mg by mouth daily.      . carvedilol (COREG) 12.5 MG tablet Take 1 tablet (12.5 mg total) by mouth 2 (two) times daily.  60 tablet  6  . lisinopril (PRINIVIL,ZESTRIL) 20 MG tablet Take 1 tablet (20 mg total) by mouth daily.  30 tablet  6  . rosuvastatin (CRESTOR) 10 MG tablet Take 1 tablet (10 mg total) by mouth daily.  30 tablet  3    BP 120/84  Pulse 82  Ht 5' 10" (1.778 m)  Wt 231 lb (104.781 kg)  BMI 33.15 kg/m2 General: NAD, obese.  Neck: Thick, JVP 7 cm, no thyromegaly or thyroid nodule.  Lungs: Clear to auscultation bilaterally with normal respiratory effort.  CV: Nondisplaced PMI. Heart regular S1/S2, no S3/S4, no murmur. 1+ ankle edema. No carotid bruit. Normal pedal pulses.  Abdomen: Soft, nontender, no hepatosplenomegaly, no distention.  Neurologic: Alert and oriented x 3.  Psych: Normal affect.  Extremities: No clubbing or cyanosis.   Assessment/Plan:  CORONARY ATHEROSCLEROSIS NATIVE CORONARY ARTERY  No chest pain but he has had a dramatic fall in his EF compared to 2/13 (45% => 15%).  I am concerned that he may have had re-occlusion of his RCA during his complicated medical course this year.  He has been off Effient due to the left leg hematoma and was off aspirin as well for a long period (though this has been started back).   - I will do a left heart cath this week to re-assess his coronaries for worsening disease.   - Continue ASA 81 and restart Crestor 10 mg daily.  He had myalgias with Lipitor but was able to take Crestor.   HYPERLIPIDEMIA  As  above, restarting Crestor.  Will check lipids/LFTs in 2 months.  Systolic CHF, chronic EF is considerably worsened compared to prior echo (down to 15% from 45%).  He is near-euvolemic on exam, NYHA class II symptoms. As above, I will assess with LHC.   - No indication for Lasix at this point.  - Increase Coreg to 12.5 mg bid.  - He has been taking lisinopril 20 mg daily rather than the 10 mg daily that was prescribed.  Continue lisinopril at the 20 mg daily dose.   I will see him back 2 wks after cath.   Chryl Holten 01/11/2013  

## 2013-01-24 NOTE — Progress Notes (Signed)
Utilization Review Completed Linard Daft J. Mahogani Holohan, RN, BSN, NCM [phone]  

## 2013-01-24 NOTE — CV Procedure (Signed)
   CARDIAC CATH NOTE  Name: RYKAR LEBLEU MRN: [id number] DOB: 1941-09-23  Procedure: PTCA and stenting of the Proximal/ostial RCA.   Indication: CAD with cardiomyopathy.   Procedural Details: The right groin was prepped, draped, and anesthetized with 1% lidocaine. Using the modified Seldinger technique, a 6 Fr sheath was introduced into the right femoral artery.  Weight-based bivalirudin was given for anticoagulation. Once a therapeutic ACT was achieved, a 6 [place] JR4 guide catheter was inserted.  An Intuition coronary guidewire was used to cross the lesion.  The lesion was predilated with a 2.5 X 12 mm balloon.  A second coronary wire was placed in the aortic root to identify the ostium of RCA and place the stent. The lesion was then stented with a 3.5 X 15 mm Xience Expedition stent to 14 atm. The balloon was pulled back and inflated again to 14 atm.   Following PCI, there was 0% residual stenosis and TIMI-3 flow. Final angiography confirmed an excellent result. The patient tolerated the procedure well. There were no immediate procedural complications. Femoral hemostasis was achieved with a Mynx device. The patient was transferred to the post catheterization recovery area for further monitoring.  Lesion Data: Vessel: Ostial/Proximal RCA  Percent stenosis (pre): 95%  TIMI-flow (pre):  3  Stent:  3. 5 X 15 mm Xience DES Percent stenosis (post): 0 % TIMI-flow (post): 3  Conclusions:  Successful PCA and DES placement to ostial and proximal RCA  Recommendations: Dual antiplatelet therapy for at least 1 year.   Lorine Bears 01/24/2013, 2:06 PM

## 2013-01-24 NOTE — Progress Notes (Signed)
Utilization Review Completed Mylinh Cragg J. Enrica Corliss, RN, BSN, NCM [phone]  

## 2013-01-24 NOTE — Interval H&P Note (Signed)
History and Physical Interval Note:  01/24/2013 1:13 PM  Wayne [name]  has presented today for surgery, with the diagnosis of cad  The various methods of treatment have been discussed with the patient and family. After consideration of risks, benefits and other options for treatment, the patient has consented to  Procedure(s): PERCUTANEOUS CORONARY STENT INTERVENTION (PCI-S) (N/A) as a surgical intervention .  The patient's history has been reviewed, patient examined, no change in status, stable for surgery.  I have reviewed the patient's chart and labs.  Questions were answered to the patient's satisfaction.     Lorine Bears

## 2013-01-25 ENCOUNTER — Encounter (HOSPITAL_COMMUNITY): Payer: Self-pay | Admitting: Cardiology

## 2013-01-25 DIAGNOSIS — I214 Non-ST elevation (NSTEMI) myocardial infarction: Secondary | ICD-10-CM | POA: Diagnosis not present

## 2013-01-25 DIAGNOSIS — Z9861 Coronary angioplasty status: Secondary | ICD-10-CM

## 2013-01-25 DIAGNOSIS — I10 Essential (primary) hypertension: Secondary | ICD-10-CM | POA: Diagnosis not present

## 2013-01-25 DIAGNOSIS — M353 Polymyalgia rheumatica: Secondary | ICD-10-CM | POA: Diagnosis not present

## 2013-01-25 DIAGNOSIS — K219 Gastro-esophageal reflux disease without esophagitis: Secondary | ICD-10-CM | POA: Diagnosis not present

## 2013-01-25 DIAGNOSIS — I2589 Other forms of chronic ischemic heart disease: Secondary | ICD-10-CM | POA: Diagnosis not present

## 2013-01-25 DIAGNOSIS — I2510 Atherosclerotic heart disease of native coronary artery without angina pectoris: Secondary | ICD-10-CM | POA: Diagnosis not present

## 2013-01-25 DIAGNOSIS — I428 Other cardiomyopathies: Secondary | ICD-10-CM | POA: Diagnosis not present

## 2013-01-25 DIAGNOSIS — C61 Malignant neoplasm of prostate: Secondary | ICD-10-CM | POA: Diagnosis not present

## 2013-01-25 LAB — BASIC METABOLIC PANEL
BUN: 23 mg/dL (ref 6–23)
CO2: 22 mEq/L (ref 19–32)
Chloride: 107 mEq/L (ref 96–112)
Creatinine, Ser: 1.14 mg/dL (ref 0.50–1.35)
GFR calc Af Amer: 74 mL/min — ABNORMAL LOW (ref >90)
Glucose, Bld: 136 mg/dL — ABNORMAL HIGH (ref 70–99)
Potassium: 4.2 mEq/L (ref 3.5–5.1)

## 2013-01-25 LAB — CBC
HCT: 36.1 % — ABNORMAL LOW (ref 39.0–52.0)
Hemoglobin: 12.1 g/dL — ABNORMAL LOW (ref 13.0–17.0)
MCV: 83.4 fL (ref 78.0–100.0)
RBC: 4.33 MIL/uL (ref 4.22–5.81)
WBC: 6.7 10*3/uL (ref 4.0–10.5)

## 2013-01-25 LAB — GLUCOSE, CAPILLARY: Glucose-Capillary: 152 mg/dL — ABNORMAL HIGH (ref 70–99)

## 2013-01-25 MED ORDER — METFORMIN HCL 500 MG PO TABS: 500.0000 mg | ORAL_TABLET | Freq: Two times a day (BID) | ORAL | Status: DC

## 2013-01-25 MED FILL — Dextrose Inj 5%: INTRAVENOUS | Qty: 50 | Status: AC

## 2013-01-25 NOTE — Discharge Summary (Signed)
Patient seen and examined and history reviewed. Agree with above findings and plan. See earlier rounding note.  Theron Arista JordanMD 01/25/2013 2:12 PM

## 2013-01-25 NOTE — Progress Notes (Signed)
Cardiology Progress Note Patient Name: Wayne [name] Date of Encounter: 01/25/2013, 8:39 AM     Subjective  Denies chest pain or shortness of breath. Able to ambulate with cardiac rehab without difficulty.    Objective   Telemetry: sinus rhythm 60s-70s  Medications: . aspirin EC  81 mg Oral Daily  . carvedilol  12.5 mg Oral BID WC  . clopidogrel  75 mg Oral QAC breakfast  . insulin aspart  0-15 Units Subcutaneous TID WC  . insulin aspart  0-5 Units Subcutaneous QHS  . lisinopril  20 mg Oral Daily  . multivitamin with minerals  1 tablet Oral Daily      Physical Exam: Temp:  [96.9 F (36.1 C)-97.9 F (36.6 C)] 97.7 F (36.5 C) (02/11 0747) Pulse Rate:  [69-78] 71 (02/11 0747) Resp:  [13-21] 13 (02/11 0747) BP: (97-115)/(47-79) 107/58 mmHg (02/11 0747) SpO2:  [88 %-100 %] 100 % (02/11 0747) Weight:  [231 lb (104.781 kg)-233 lb 4 oz (105.8 kg)] 233 lb 4 oz (105.8 kg) (02/11 0508)  General: Pleasant white male, in no acute distress. Head: Normocephalic, atraumatic, sclera non-icteric, nares are without discharge.  Neck: Supple. Negative for carotid bruits or JVD Lungs: Clear bilaterally to auscultation without wheezes, rales, or rhonchi. Breathing is unlabored. Heart: RRR S1 S2 without murmurs, rubs, or gallops.  Abdomen: Soft, non-tender, non-distended with normoactive bowel sounds. No rebound/guarding. No obvious abdominal masses. Msk:  Strength and tone appear normal for age. Extremities: Well healed scar to left calf. Trace bilat ankle edema. No clubbing or cyanosis. Distal pedal pulses are intact and equal bilaterally. Neuro: Alert and oriented X 3. Moves all extremities spontaneously. Psych:  Responds to questions appropriately with a normal affect.   Intake/Output Summary (Last 24 hours) at 01/25/13 0839 Last data filed at 01/24/13 2200  Gross per 24 hour  Intake   1255 ml  Output    400 ml  Net    855 ml    Labs:   01/25/13 0625  NA 138  K 4.2  CL  107  CO2 22  GLUCOSE 136*  BUN 23  CREATININE 1.14  CALCIUM 9.3    01/25/13 0625  WBC 6.7  HGB 12.1*  HCT 36.1*  MCV 83.4  PLT 174    01/24/13 1620  HGBA1C 8.1*    Radiology/Studies:   01/13/13 - Cardiac Cath Hemodynamics:  AO 100/55  LV 121/20  Coronary angiography:  Coronary dominance: right  Left mainstem: 40% distal left main stenosis.  Left anterior descending (LAD): 30% proximal LAD stenosis. Luminal irregularities throughout.  Left circumflex (LCx): Large ramus with patent stent in the proximal portion of the vessel. The AV LCx is a small vessel. Subtotal occlusion, progressive from prior study.  Right coronary artery (RCA): The RCA is covered with overlapping stents from the ostium to the distal vessel. There was damping of the pressure waveform with engagement of the RCA. There was significant up to 95% ostial stenosis in the RCA, within stent. The remainder of the RCA had minimal disease. There were faint collaterals from the RCA to the territory of the AV LCx. TIMI 3 flow in RCA.  Left ventriculography: EF is around 20% (difficult with PVCs). The inferior wall is akinetic.  Final Conclusions: LAD and large ramus patent. The AV LCx covered a small territory and is diffusely diseased. The RCA has multiple overlapping stents from the ostium to the distal vessel with damping upon engagement of the vessel.  There is 95% ostial stenosis. The inferior wall is akinetic.  Recommendations: Discussed films with Dr. Excell Seltzer. Hopefully a short ostial DES would open the RCA. Given the significant fall in EF but good flow down RCA, I think that this is warranted. I will start Plavix prior to procedure and we will check P2Y12 level prior to PCI to make sure he is not a Plavix hyper-responder. I think that Plavix will be unlikely to cause him the bleeding problems that he had with Effient. Discussed with wife and patient, PCI scheduled 2/10 with Dr. Excell Seltzer  01/24/13 - Cardiac  Cath/PCI Lesion Data:  Vessel: Ostial/Proximal RCA  Percent stenosis (pre): 95%  TIMI-flow (pre): 3  Stent: 3. 5 X 15 mm Xience DES  Percent stenosis (post): 0 %  TIMI-flow (post): 3  Conclusions: Successful PCA and DES placement to ostial and proximal RCA  Recommendations: Dual antiplatelet therapy for at least 1 year    Assessment and Plan   1. Coronary Artery Disease: s/p PTCA/DES to osital/prox RCA. Previous bleeding issues with Effient. Cont DAPT w/ ASA and Plavix for at least one year. Will check P2Y12 as requested in Dr. Alford Highland cath note 1/30. Cont BB, ACEI, and Crestor. Cath site stable  2. Ischemic Cardiomyopathy: EF 20% by cath 1/30. Now s/p revascularization. Cont medical therapy with Coreg and Lisinopril. Euvolemic on exam. Will need repeat echo 3 months after max medical therapy to reassess EF and determine need for ICD for primary prevention of SCD.   3. Hyperlipidemia: Intolerant to Lipitor (myalgias). Restarted on Crestor on 1/30. Will need f/u lipid panel and LFTs in 1 month.   4. Diabetes Mellitus, Type 2, Uncontrolled: A1c 8.1. Patient reports being on Metformin 1000mg  BID in the past, but was not restarted when discharged from the hospital in Aug 2013. Will resume 48hrs post cath and have him follow up with his PCP.    Signed, HOPE, JESSICA PA-C  Patient seen and examined and history reviewed. Agree with above findings and plan. Doing well s/p DES of the ostium of the RCA. P2Y12 assay pending. Would DC on Plavix and ASA. Prior bleeding problems on Effient.  [name] 01/25/2013 11:21 AM

## 2013-01-25 NOTE — Discharge Summary (Signed)
Discharge Summary   Patient ID: Wayne [name] MRN: [id number], DOB/AGE: 03/26/1941 72 y.o.  Primary MD: Kristian Covey, MD Primary Cardiologist: Dr. Shirlee Latch Admit date: 01/24/2013 D/C date:     01/25/2013      Primary Discharge Diagnoses:  1. Coronary Artery Disease  - s/p PTCA/DES to osital/prox RCA   - Previous bleeding issues with Effient. Cont DAPT w/ ASA and Plavix for at least one year  - P2Y12 assay pending  2. Ischemic Cardiomyopathy   - EF 15% by echo and 20% by cath  - Will need repeat echo 3 months after max medical therapy to reassess EF and determine need for ICD for primary prevention of SCD  3. Hyperlipidemia  - Intolerant to Lipitor (myalgias)  - Restarted on Crestor on 1/30  - Will need f/u lipid panel and LFTs in 1 month.   4. Diabetes Mellitus, Type 2, Uncontrolled  - A1c 8.1  - Pt reportedly was not continued on oral hypoglycemics after last hospitalization  - Resume Metformin 500mg  BID 48hrs post cath and follow up with PCP    Secondary Discharge Diagnoses:  . HYPERTENSION   . CORONARY ATHEROSCLEROSIS NATIVE CORONARY ARTERY     a. 01/2011 Cath/PCI: LM nl, LAD 40-50p, D1 80-small, LCX 95-small, RI 90, RCA 100, EF 20%;  b. 01/2011 Card MRI - No transmural scar;  c. 01/2011 PCI RCA->5 Promus DES, RI->3.0x16 Promus DES; d. Cath 01/13/13 patent LAD & Ramus, diffuse LCx dz, RCA mult overlapping stents w/ 95% osital stenosis, EF 20%, s/p DES to ostial/prox RCA 01/24/13   . Herpes zoster ophthalmicus   . Arthritis   . Obesity   . GERD (gastroesophageal reflux disease)   . Ischemic cardiomyopathy     a. 01/2012 Echo EF 45%, mild LVH; b. EF15%, grade 1 diastolic dysfunction, diffuse hypokinesis, inferoposterior akinesis   . Adenocarcinoma of prostate     s/p seed implants  . Polymyalgia rheumatica   . Noncompliance   . Hematoma of leg     a. left leg hematoma 03/2012 in the setting of asa/effient  . Cellulitis of left leg     a. 05/2012 complicated by septic  shock  . OSA (obstructive sleep apnea)   . Chronic combined systolic and diastolic CHF (congestive heart failure)     Allergies Allergies  Allergen Reactions  . Lipitor (Atorvastatin) Other (See Comments)    REACTION: sore legs  . Ancef (Cefazolin) Rash    Diagnostic Studies/Procedures:   01/13/13 - Cardiac Cath  Hemodynamics:  AO 100/55  LV 121/20  Coronary angiography:  Coronary dominance: right  Left mainstem: 40% distal left main stenosis.  Left anterior descending (LAD): 30% proximal LAD stenosis. Luminal irregularities throughout.  Left circumflex (LCx): Large ramus with patent stent in the proximal portion of the vessel. The AV LCx is a small vessel. Subtotal occlusion, progressive from prior study.  Right coronary artery (RCA): The RCA is covered with overlapping stents from the ostium to the distal vessel. There was damping of the pressure waveform with engagement of the RCA. There was significant up to 95% ostial stenosis in the RCA, within stent. The remainder of the RCA had minimal disease. There were faint collaterals from the RCA to the territory of the AV LCx. TIMI 3 flow in RCA.  Left ventriculography: EF is around 20% (difficult with PVCs). The inferior wall is akinetic.  Final Conclusions: LAD and large ramus patent. The AV LCx covered a small territory and is diffusely diseased.  The RCA has multiple overlapping stents from the ostium to the distal vessel with damping upon engagement of the vessel. There is 95% ostial stenosis. The inferior wall is akinetic.  Recommendations: Discussed films with Dr. Excell Seltzer. Hopefully a short ostial DES would open the RCA. Given the significant fall in EF but good flow down RCA, I think that this is warranted. I will start Plavix prior to procedure and we will check P2Y12 level prior to PCI to make sure he is not a Plavix hyper-responder. I think that Plavix will be unlikely to cause him the bleeding problems that he had with Effient.  Discussed with wife and patient, PCI scheduled 2/10 with Dr. Excell Seltzer   01/24/13 - Cardiac Cath/PCI  Lesion Data:  Vessel: Ostial/Proximal RCA  Percent stenosis (pre): 95%  TIMI-flow (pre): 3  Stent: 3. 5 X 15 mm Xience DES  Percent stenosis (post): 0 %  TIMI-flow (post): 3  Conclusions: Successful PCA and DES placement to ostial and proximal RCA  Recommendations: Dual antiplatelet therapy for at least 1 year   History of Present Illness: 72 y.o. male w/ PMHx significant for CAD, ICM, DM, and HTN who was evaluated in clinic by Dr. Shirlee Latch on 01/11/13 after routine echo earlier that month revealed EF of 15%.   Patient had a CHF exacerbation in 2/12 and was found to have LV systolic dysfunction with EF around 20%. LHC showed RCA, ramus, and CFX disease. RCA was subtotally occluded. Cardiac MRI showed that all walls, including the inferior wall, should be viable. Patient therefore underwent opening of his chronic totally occluded RCA as well as PCI to the ramus in 2/12. He received drug eluting stents and was on Effient. He had an echo in 2/13 that showed EF improved to 45% with moderate LV dilation and mild LV hypertrophy.   In 5/13, he developed a large left lower leg hematoma. He was still on Effient at that time. ASA and Effient were stopped. The hematoma did not resolve. In 7/13, he was re-admitted with septic shock from MSSA from an abscess in his left gastrocnemius. He also grew Pseudomonas from the left gastrocnemius as well. He had a prolonged course in the hospital and later in a rehab facility. Ultimately, he got back home again and has been doing fairly well. He had an echo in 1/14, and this showed EF 15% with diffuse hypokinesis and inferoposterior akinesis. He had been off of most of his prior cardiac medications. He was only taking ASA 81, Coreg, and lisinopril. He underwent cardiac cath on 01/13/13 which revealed patent LAD & Ramus, diffuse LCx dz, and RCA with multiple overlapping stents w/  95% osital stenosis, EF 20%. He was started on Plavix and scheduled for PCI on 01/24/13.   Hospital Course: He presented to [facility] on 01/24/13 for planned cardiac catheterization and underwent successful placement of DES to the ostial/proximal RCA. He tolerated the procedure well without complications. Recommendations were for DAPT with ASA and Plavix for at least one year as well as continued medical therapy with BB, ACEI and Crestor. He has a history of bleeding while on Effient. P2Y12 assay was drawn, but results not available at time of discharge. He will need a repeat echo after three months of maximal medical therapy to reassess LV function and determine need for ICD for primary prevention of SCD. His A1c was elevated at 8.1 and he reported previously being on Metformin 1000mg  BID, but this was not continued after his last hospitalization  per his report. He will resume Metformin 500mg  BID 48hrs post cath and follow up with his PCP. He was able to ambulate with cardiac rehab without chest pain. Cath site remained stable.   He was seen and evaluated by Dr. [place] who felt he was stable for discharge home with plans for follow up as scheduled below.  Discharge Vitals: Blood pressure 107/58, pulse 71, temperature 97.7 F (36.5 C), temperature source Oral, resp. rate 13, height 5\' 10"  (1.778 m), weight 233 lb 4 oz (105.8 kg), SpO2 100.00%.  Labs:  01/25/13 0625   NA  138   K  4.2   CL  107   CO2  22   GLUCOSE  136*   BUN  23   CREATININE  1.14   CALCIUM  9.3     01/25/13 0625   WBC  6.7   HGB  12.1*   HCT  36.1*   MCV  83.4   PLT  174     01/24/13 1620   HGBA1C  8.1*      Discharge Medications     Medication List    TAKE these medications       aspirin EC 81 MG tablet  Take 81 mg by mouth daily.     carvedilol 12.5 MG tablet  Commonly known as:  COREG  Take 1 tablet (12.5 mg total) by mouth 2 (two) times daily.     CENTRUM SILVER ADULT 50+ Tabs  Take 1  tablet by mouth daily.     clopidogrel 75 MG tablet  Commonly known as:  PLAVIX  Take 75 mg by mouth daily.     lisinopril 20 MG tablet  Commonly known as:  PRINIVIL,ZESTRIL  Take 1 tablet (20 mg total) by mouth daily.     metFORMIN 500 MG tablet  Commonly known as:  GLUCOPHAGE  Take 1 tablet (500 mg total) by mouth 2 (two) times daily with a meal. Start taking on 01/27/13     rosuvastatin 10 MG tablet  Commonly known as:  CRESTOR  Take 1 tablet (10 mg total) by mouth daily.        Disposition   Discharge Orders   Future Appointments Provider Department Dept Phone   02/01/2013 4:00 PM Laurey Morale, MD [facility] Main Office [place]) [phone]   Future Orders Complete By Expires     Amb Referral to Cardiac Rehabilitation  As directed     Diet - low sodium heart healthy  As directed     Discharge instructions  As directed     Comments:      * Please take all medications as prescribed and bring them with you to your office visit  * Please start taking Metformin 500mg  twice a day. You will need to wait 48hrs after your heart catheterization so please start on 01/27/13. Follow up with your primary care provider for further diabetes management.   * KEEP GROIN SITE CLEAN AND DRY. Call the office for any signs of bleedings, pus, swelling, increased pain, or any other concerns. * NO HEAVY LIFTING (>10lbs) OR SEXUAL ACTIVITY X 7 DAYS. * NO DRIVING X 3-5 DAYS. * NO SOAKING BATHS, HOT TUBS, POOLS, ETC., X 7 DAYS.    Increase activity slowly  As directed       Follow-up Information   Follow up with Marca Ancona, MD On 02/01/2013. (4:00)    Contact information:   [facility] [address] [place] [address]  [phone]       Follow up with Kristian Covey, MD. Schedule an appointment as soon as possible for a visit in 1 week. (Please follow up with your PCP to discuss your cholesterol and diabetes management)    Contact information:   [address]  Christena Flake [facility] [place] [address] [phone]        Outstanding Labs/Studies:  Follow up P2Y12 assay results Lipid panel/LFTs in one month  Duration of Discharge Encounter: Greater than 30 minutes including physician and PA time.  Signed, Karstyn Birkey PA-C 01/25/2013, 12:20 PM

## 2013-01-25 NOTE — Progress Notes (Signed)
CARDIAC REHAB PHASE I   PRE:  Rate/Rhythm: 77 SR    BP: sitting 107/58    SaO2:   MODE:  Ambulation: 1000 ft   POST:  Rate/Rhythm: 101    BP: sitting 133/67     SaO2:   Tolerated very well. Denied problems. Ed completed including HF, sodium, ex, restrictions, and CRPII. Voiced understanding and requests his name be sent to [facility].  [id number]  Wayne [name] CES, ACSM

## 2013-02-01 ENCOUNTER — Ambulatory Visit (INDEPENDENT_AMBULATORY_CARE_PROVIDER_SITE_OTHER): Payer: Medicare Other | Admitting: Cardiology

## 2013-02-01 ENCOUNTER — Encounter: Payer: Self-pay | Admitting: Cardiology

## 2013-02-01 VITALS — BP 120/78 | HR 80 | Ht 70.0 in | Wt 235.0 lb

## 2013-02-01 DIAGNOSIS — E785 Hyperlipidemia, unspecified: Secondary | ICD-10-CM

## 2013-02-01 DIAGNOSIS — I5022 Chronic systolic (congestive) heart failure: Secondary | ICD-10-CM

## 2013-02-01 DIAGNOSIS — I509 Heart failure, unspecified: Secondary | ICD-10-CM | POA: Diagnosis not present

## 2013-02-01 DIAGNOSIS — I2510 Atherosclerotic heart disease of native coronary artery without angina pectoris: Secondary | ICD-10-CM | POA: Diagnosis not present

## 2013-02-01 MED ORDER — CARVEDILOL 12.5 MG PO TABS: ORAL_TABLET | ORAL | Status: DC

## 2013-02-01 MED ORDER — TICLOPIDINE HCL 250 MG PO TABS: 250.0000 mg | ORAL_TABLET | Freq: Two times a day (BID) | ORAL | Status: DC

## 2013-02-01 NOTE — Patient Instructions (Addendum)
Increase coreg(carvedilol) to 18.75mg  two times a day. This will be 1 and 1/2 of your 12.5mg  tablet two times a day.  Start Ticlid 250mg  two times a day.   Dr Shirlee Latch recommends you participate in Cardiac Rehab. The order was faxed today. They should be in contact with you about starting Cardiac Rehab at [facility].   Your physician recommends that you return for lab work in: 2 weeks--CBCd.  Your physician recommends that you schedule a follow-up appointment in: 1 month with Dr Shirlee Latch.   Your physician recommends that you return for a FASTING lipid profile /liver profile in 2 months.   Your physician has requested that you have an echocardiogram. Echocardiography is a painless test that uses sound waves to create images of your heart. It provides your doctor with information about the size and shape of your heart and how well your heart's chambers and valves are working. This procedure takes approximately one hour. There are no restrictions for this procedure. In 3 months.

## 2013-02-02 NOTE — Progress Notes (Signed)
Patient ID: Wayne [name], male   DOB: 1941-02-25, 72 y.o.   MRN: [id number] PCP: Dr. Caryl Never  72 yo with history of DM, HTN, CAD, and ischemic cardiomyopathy presents for cardiology followup. Patient had a CHF exacerbation in 2/12 and was found to have LV systolic dysfunction with EF around 20%. LHC showed RCA, ramus, and CFX disease. RCA was subtotally occluded. Cardiac MRI showed that all walls, including the inferior wall, should be viable. Patient therefore underwent opening of his chronic totally occluded RCA as well as PCI to the ramus in 2/12. He received drug eluting stents and was on Effient.  He had an echo in 2/13 that showed EF improved to 45% with moderate LV dilation and mild LV hypertrophy.   In 5/13, he developed a large left lower leg hematoma.  He was still on Effient at that time.  ASA and Effient were stopped.  The hematoma did not resolve.  In 7/13, he was re-admitted with septic shock from MSSA from an abscess in his left gastrocnemius.  He also grew Pseudomonas from the left gastrocnemius as well.  He had a prolonged course in the hospital and later in a rehab facility.  Ultimately, he got back home again and has been doing fairly well.  I had him get an echo in 1/14, and this showed EF 15% with diffuse hypokinesis and inferoposterior akinesis.  He had been off of most of his prior cardiac medications.  I took him back for [facility] in 1/14.  This showed subtotal occlusion of a small AV LCx and 95% ostial in-stent restenosis in the RCA.  He was treated in 2/14 with a Xience DES to the ostial RCA and begun on Plavix.  Unfortunately, he developed diffuse hives after starting Plavix last week and stopped it after Friday. He did not tell anyone he stopped Plavix as he knew he had an appointment today.    Symptomatically, he denies exertional dyspnea when walking on flat ground and can get to the top of a flight of steps.  No chest pain.  He feels like his energy level is better since PCI last  week.    ECG: NSR, LVH  Labs (3/12): TSH normal, ESR normal, HDL 34, LDL 77, LFTs normal, K 4.3, creatinine 0.9  Labs (4/12): K 4, creatinine 0.9, BNP 234 Labs (6/12): K 3.8, creatinine 0.72 Labs (9/12): K 4.4, creatinine 0.9, BNP 145, digoxin 0.8, hgbA1c 7.8 Labs (10/12): K 4, creatinine 0.7 Labs (2/13): K 4.5, creatinine 0.9, LDL 116, HDL 42 Labs (8/13): K 3.6, creatinine 1.25 Labs (2/14): K 4.2, creatinine 1.04  Past Medical History:  1. HYPERTENSION  2. HYPERLIPIDEMIA  3. ECZEMA  4. RHINITIS  5. HERPES ZOSTER OPHTHALMICUS  6. ADENOCARCINOMA, PROSTATE: Status post prostatectomy in 2009. Has had some incontinence since then.  7. Diabetes mellitus type II  8. Arthritis  9. Obesity  10. GERD: rare  11. CAD: Presented with exertional dyspnea, never had chest pain. LHC (2/12) with subtotalled proximal RCA and left to right collaterals, 90% proximal moderate-sized ramus, 95% proximal relatively small CFX, 40-50% proximal LAD. Cardiac MRI (2/12) showed EF 21%, some mild scar in basal segments but all wall segments would be expected to be viable.        a. DES x 5 (overlapping) to RCA 01/30/11        b. DES x 1 to RI 01/30/11  12. Ischemic CMP: Echo (2/12) with moderately dilated LV, EF about 20% with diffuse hypokinesis and  inferior akinesis, pseudonormal diastolic function, mild MR, severe LAE, mildly decreased RV systolic function. RHC (2/12) with mean RA 12, PA 40/25, mean PCWP 26, CI 2.1.  Echo (5/12) with EF 40% (appeared worse to my eye) with posterior HK, basal inferior AK, inferoseptal AK, basal anteroseptal AK, mild MR.  Cardiac MRI was repeated and showed EF 32% (improved from 21%) and mild LV dilation (was severely dilated before) with diffuse hypokinesis and subendocardial scar in the basal inferior, basal posterior, and basal anterolateral segments.  Echo (2/13) with EF 45%, moderate LV dilation, mild LVH.  Echo (1/14) with EF 15%, diffuse hypokinesis, inferoposterior akinesis, mild  MR, grade I diastolic dysfunction. LHC (1/14): AV LCx small with subtotal occlusion, 95% ostial instent restenosis in RCA. PCI in 2/14 to ostial RCA with 3.5 x 15 Xience DES.  Plavix allergy (hives).  13. Cervical OA.  14. Polymyalgia rheumatica 15. OSA: Severe on sleep study 10/12.  On CPAP.  16. Left lower leg hematoma in setting of Effient use.  He developed a left gastrocnemius abscess and septic shock with prolonged hospitalization beginning in 7/13.   Family History:  Father died with MI at age 75. He was an alcoholic. Mother died with cancer at around 76.   Social History:  Occupation: retired [employer] architect  Married, lives in [place]  Past smoker, quit around 1990   ROS: all systems reviewed and negative except as per HPI.    Current Outpatient Prescriptions  Medication Sig Dispense Refill  . aspirin EC 81 MG tablet Take 81 mg by mouth daily.      Marland Kitchen lisinopril (PRINIVIL,ZESTRIL) 20 MG tablet Take 1 tablet (20 mg total) by mouth daily.  30 tablet  6  . metFORMIN (GLUCOPHAGE) 500 MG tablet Take 1 tablet (500 mg total) by mouth 2 (two) times daily with a meal. Start taking on 01/27/13  60 tablet  1  . Multiple Vitamins-Minerals (CENTRUM SILVER ADULT 50+) TABS Take 1 tablet by mouth daily.      . rosuvastatin (CRESTOR) 10 MG tablet Take 1 tablet (10 mg total) by mouth daily.  30 tablet  3  . carvedilol (COREG) 12.5 MG tablet 1 and 1/2 tablets(total 18.75mg ) two times a day  90 tablet  6  . ticlopidine (TICLID) 250 MG tablet Take 1 tablet (250 mg total) by mouth 2 (two) times daily.  60 tablet  6   No current facility-administered medications for this visit.    BP 120/78  Pulse 80  Ht 5\' 10"  (1.778 m)  Wt 235 lb (106.595 kg)  BMI 33.72 kg/m2 General: NAD, obese.  Neck: Thick, JVP 7 cm, no thyromegaly or thyroid nodule.  Lungs: Clear to auscultation bilaterally with normal respiratory effort.  CV: Nondisplaced PMI. Heart regular S1/S2, no S3/S4, no murmur. 1+ ankle edema. No  carotid bruit. Normal pedal pulses.  Abdomen: Soft, nontender, no hepatosplenomegaly, no distention.  Neurologic: Alert and oriented x 3.  Psych: Normal affect.  Extremities: No clubbing or cyanosis.   Assessment/Plan:  CORONARY ATHEROSCLEROSIS NATIVE CORONARY ARTERY  No chest pain but he had a dramatic fall in his EF compared to 2/13 (45% => 15%).  He had been off Effient due to the left leg hematoma and was off aspirin as well for a long period (though this has been started back).  LHC in 1/14 showed tight in-stent restenosis in the ostial RCA that was treated with a Xience DES.  He now feels like he has more energy.  Unfortunately, it  sounds like he has developed a Plavix allergy.  He quit taking Plavix without alerting our office.  He is not going to be a good Brilinta or Effient candidate based on his complicated leg hematoma on Effient recently.  I will start him on ticlopidine 250 mg bid today with CBC in 2 wks. Continue ASA 81 and Crestor.  He will need to continue ticlopidine at least a year. I will arrange for cardiac rehab.  HYPERLIPIDEMIA  Will check lipids/LFTs in 2 months on Crestor.  Systolic CHF, chronic EF is considerably worsened compared to prior echo (down to 15% from 45%) in setting of tight stenosis in the ostial RCA.  He is near-euvolemic on exam, NYHA class II symptoms. As above, he has had PCI to RCA.  - No indication for Lasix at this point.  - Increase Coreg to 18.75 mg bid.  - Continue lisinopril 20 mg daily.  - Will need echo in 3 months for EF, if still < 35% should get ICD.   Followup in 1 month.    Marca Ancona 02/02/2013

## 2013-02-02 NOTE — Addendum Note (Signed)
Addended by: Elmarie Shiley on: 02/02/2013 05:32 PM   Modules accepted: Orders

## 2013-02-04 ENCOUNTER — Telehealth: Payer: Self-pay | Admitting: Cardiology

## 2013-02-04 NOTE — Telephone Encounter (Signed)
Spoke with Dr Shirlee Latch who is aware of pts rash.  He would like for the pt to attempt to continue Ticlid at this point by taking Benadryl thru the weekend.  Pt is to try to continue and call back on Monday.  Left message for pt to call back.

## 2013-02-04 NOTE — Telephone Encounter (Signed)
Pt is aware of instructions, is agreeable and will call back on Monday to follow up

## 2013-02-04 NOTE — Telephone Encounter (Signed)
Pt was not able to get Ticlopidine the day it was ordered and he started it yesterday about 5 or 6 pm.  Today after his shower he noticed itching and saw whelps and hives on his legs, hips and arms.  States it started in the bend of his elbow.  The rash is red and raised.  He has not taken any more Ticlid since yesterday.  Aware I will attempt to contact Dr Shirlee Latch and call him back.

## 2013-02-04 NOTE — Telephone Encounter (Signed)
New Problem   Wife calling on behalf of pt: Plavis broke pt out, Dr. Shirlee Latch prescribed something different on Wed 2/18. Pt went to [facility] pharm wed morning and pharm didn't have it. Went back and they have Thurs evening. Pt is very upset that the medication was not available right away. She wanted to notify Dr. Shirlee Latch about the situation. In addition, pt broke out with alternative medication as well. Would like to speak to nurse.

## 2013-02-07 NOTE — Telephone Encounter (Signed)
LMTCB

## 2013-02-07 NOTE — Telephone Encounter (Signed)
Spoke with pt. Pt states he took 1 dose of benadryl last week. He no longer has a rash and is tolerating ticlopidine.

## 2013-02-15 ENCOUNTER — Ambulatory Visit (INDEPENDENT_AMBULATORY_CARE_PROVIDER_SITE_OTHER): Payer: Medicare Other

## 2013-02-15 DIAGNOSIS — I2510 Atherosclerotic heart disease of native coronary artery without angina pectoris: Secondary | ICD-10-CM

## 2013-02-15 DIAGNOSIS — I5022 Chronic systolic (congestive) heart failure: Secondary | ICD-10-CM | POA: Diagnosis not present

## 2013-02-16 LAB — CBC WITH DIFFERENTIAL/PLATELET
Basophils Absolute: 0 10*3/uL (ref 0.0–0.1)
Eosinophils Absolute: 0.4 10*3/uL (ref 0.0–0.7)
HCT: 37.7 % — ABNORMAL LOW (ref 39.0–52.0)
Hemoglobin: 12.5 g/dL — ABNORMAL LOW (ref 13.0–17.0)
Lymphocytes Relative: 26.2 % (ref 12.0–46.0)
Lymphs Abs: 1.7 10*3/uL (ref 0.7–4.0)
MCHC: 33.2 g/dL (ref 30.0–36.0)
Neutro Abs: 4.1 10*3/uL (ref 1.4–7.7)
Platelets: 226 10*3/uL (ref 150.0–400.0)
RDW: 14.1 % (ref 11.5–14.6)

## 2013-02-16 LAB — HEPATIC FUNCTION PANEL
AST: 20 U/L (ref 0–37)
Alkaline Phosphatase: 75 U/L (ref 39–117)
Bilirubin, Direct: 0.1 mg/dL (ref 0.0–0.3)
Total Protein: 6.6 g/dL (ref 6.0–8.3)

## 2013-02-16 LAB — LDL CHOLESTEROL, DIRECT: Direct LDL: 92.7 mg/dL

## 2013-02-16 LAB — LIPID PANEL: VLDL: 60.8 mg/dL — ABNORMAL HIGH (ref 0.0–40.0)

## 2013-02-17 ENCOUNTER — Encounter: Payer: Self-pay | Admitting: Cardiology

## 2013-02-18 ENCOUNTER — Other Ambulatory Visit: Payer: Self-pay | Admitting: *Deleted

## 2013-02-18 DIAGNOSIS — E785 Hyperlipidemia, unspecified: Secondary | ICD-10-CM

## 2013-02-18 DIAGNOSIS — I2510 Atherosclerotic heart disease of native coronary artery without angina pectoris: Secondary | ICD-10-CM

## 2013-02-18 MED ORDER — ROSUVASTATIN CALCIUM 20 MG PO TABS: 20.0000 mg | ORAL_TABLET | Freq: Every day | ORAL | Status: DC

## 2013-02-28 ENCOUNTER — Ambulatory Visit: Payer: Medicare Other | Admitting: Cardiology

## 2013-03-15 ENCOUNTER — Other Ambulatory Visit: Payer: Medicare Other

## 2013-03-16 ENCOUNTER — Other Ambulatory Visit (INDEPENDENT_AMBULATORY_CARE_PROVIDER_SITE_OTHER): Payer: Medicare Other

## 2013-03-16 DIAGNOSIS — I2510 Atherosclerotic heart disease of native coronary artery without angina pectoris: Secondary | ICD-10-CM

## 2013-03-16 DIAGNOSIS — E785 Hyperlipidemia, unspecified: Secondary | ICD-10-CM | POA: Diagnosis not present

## 2013-03-16 LAB — HEPATIC FUNCTION PANEL
ALT: 25 U/L (ref 0–53)
AST: 22 U/L (ref 0–37)
Albumin: 3.8 g/dL (ref 3.5–5.2)
Alkaline Phosphatase: 75 U/L (ref 39–117)
Total Protein: 6.7 g/dL (ref 6.0–8.3)

## 2013-03-16 LAB — LIPID PANEL
Cholesterol: 165 mg/dL (ref 0–200)
LDL Cholesterol: 96 mg/dL (ref 0–99)
Triglycerides: 156 mg/dL — ABNORMAL HIGH (ref 0.0–149.0)

## 2013-03-23 ENCOUNTER — Encounter: Payer: Self-pay | Admitting: Cardiology

## 2013-03-23 ENCOUNTER — Ambulatory Visit (INDEPENDENT_AMBULATORY_CARE_PROVIDER_SITE_OTHER): Payer: Medicare Other | Admitting: Cardiology

## 2013-03-23 VITALS — BP 120/68 | HR 86 | Ht 70.0 in | Wt 239.0 lb

## 2013-03-23 DIAGNOSIS — I2510 Atherosclerotic heart disease of native coronary artery without angina pectoris: Secondary | ICD-10-CM | POA: Diagnosis not present

## 2013-03-23 DIAGNOSIS — I428 Other cardiomyopathies: Secondary | ICD-10-CM

## 2013-03-23 DIAGNOSIS — I5022 Chronic systolic (congestive) heart failure: Secondary | ICD-10-CM | POA: Diagnosis not present

## 2013-03-23 LAB — CBC WITH DIFFERENTIAL/PLATELET
Basophils Absolute: 0 10*3/uL (ref 0.0–0.1)
Eosinophils Absolute: 0.3 10*3/uL (ref 0.0–0.7)
Lymphocytes Relative: 21.2 % (ref 12.0–46.0)
Monocytes Relative: 9.5 % (ref 3.0–12.0)
Neutrophils Relative %: 63.7 % (ref 43.0–77.0)
Platelets: 182 10*3/uL (ref 150.0–400.0)
RDW: 13.7 % (ref 11.5–14.6)

## 2013-03-23 LAB — BASIC METABOLIC PANEL
CO2: 24 mEq/L (ref 19–32)
Chloride: 105 mEq/L (ref 96–112)
Sodium: 138 mEq/L (ref 135–145)

## 2013-03-23 MED ORDER — CARVEDILOL 25 MG PO TABS: 25.0000 mg | ORAL_TABLET | Freq: Two times a day (BID) | ORAL | Status: DC

## 2013-03-23 MED ORDER — ROSUVASTATIN CALCIUM 20 MG PO TABS: 20.0000 mg | ORAL_TABLET | Freq: Every day | ORAL | Status: DC

## 2013-03-23 NOTE — Progress Notes (Signed)
Patient ID: Wayne [name], male   DOB: 03-16-1941, 72 y.o.   MRN: [id number] PCP: Dr. Caryl Never  72 yo with history of DM, HTN, CAD, and ischemic cardiomyopathy presents for cardiology followup. Patient had a CHF exacerbation in 2/12 and was found to have LV systolic dysfunction with EF around 20%. LHC showed RCA, ramus, and CFX disease. RCA was subtotally occluded. Cardiac MRI showed that all walls, including the inferior wall, should be viable. Patient therefore underwent opening of his chronic totally occluded RCA as well as PCI to the ramus in 2/12. He received drug eluting stents and was on Effient.  He had an echo in 2/13 that showed EF improved to 45% with moderate LV dilation and mild LV hypertrophy.   In 5/13, he developed a large left lower leg hematoma.  He was still on Effient at that time.  ASA and Effient were stopped.  The hematoma did not resolve.  In 7/13, he was re-admitted with septic shock from MSSA from an abscess in his left gastrocnemius.  He also grew Pseudomonas from the left gastrocnemius as well.  He had a prolonged course in the hospital and later in a rehab facility.  Ultimately, he got back home again and has been doing fairly well.  I had him get an echo in 1/14, and this showed EF 15% with diffuse hypokinesis and inferoposterior akinesis.  He had been off of most of his prior cardiac medications.  I took him back for [facility] in 1/14.  This showed subtotal occlusion of a small AV LCx and 95% ostial in-stent restenosis in the RCA.  He was treated in 2/14 with a Xience DES to the ostial RCA and begun on Plavix.  Unfortunately, he developed diffuse hives after starting Plavix and had to be switched to ticlopidine.  He has tolerated ticlopidine.    Symptomatically, he denies exertional dyspnea when walking on flat ground and can get to the top of a flight of steps.  No chest pain.  He is less fatigued and short of breath since PCI.  No orthopnea or PND.  Chronic pain in his left leg.      Labs (3/12): TSH normal, ESR normal, HDL 34, LDL 77, LFTs normal, K 4.3, creatinine 0.9  Labs (4/12): K 4, creatinine 0.9, BNP 234 Labs (6/12): K 3.8, creatinine 0.72 Labs (9/12): K 4.4, creatinine 0.9, BNP 145, digoxin 0.8, hgbA1c 7.8 Labs (10/12): K 4, creatinine 0.7 Labs (2/13): K 4.5, creatinine 0.9, LDL 116, HDL 42 Labs (8/13): K 3.6, creatinine 1.25 Labs (2/14): K 4.2, creatinine 1.04 Labs (4/14): LDL 96, HDL 38, CBC today with with normal total WBCs, normal neutrophil count, normal plts.   Past Medical History:  1. HYPERTENSION  2. HYPERLIPIDEMIA  3. ECZEMA  4. RHINITIS  5. HERPES ZOSTER OPHTHALMICUS  6. ADENOCARCINOMA, PROSTATE: Status post prostatectomy in 2009. Has had some incontinence since then.  7. Diabetes mellitus type II  8. Arthritis  9. Obesity  10. GERD: rare  11. CAD: Presented with exertional dyspnea, never had chest pain. LHC (2/12) with subtotalled proximal RCA and left to right collaterals, 90% proximal moderate-sized ramus, 95% proximal relatively small CFX, 40-50% proximal LAD. Cardiac MRI (2/12) showed EF 21%, some mild scar in basal segments but all wall segments would be expected to be viable.        a. DES x 5 (overlapping) to RCA 01/30/11        b. DES x 1 to RI 01/30/11  12.  Ischemic CMP: Echo (2/12) with moderately dilated LV, EF about 20% with diffuse hypokinesis and inferior akinesis, pseudonormal diastolic function, mild MR, severe LAE, mildly decreased RV systolic function. RHC (2/12) with mean RA 12, PA 40/25, mean PCWP 26, CI 2.1.  Echo (5/12) with EF 40% (appeared worse to my eye) with posterior HK, basal inferior AK, inferoseptal AK, basal anteroseptal AK, mild MR.  Cardiac MRI was repeated and showed EF 32% (improved from 21%) and mild LV dilation (was severely dilated before) with diffuse hypokinesis and subendocardial scar in the basal inferior, basal posterior, and basal anterolateral segments.  Echo (2/13) with EF 45%, moderate LV dilation,  mild LVH.  Echo (1/14) with EF 15%, diffuse hypokinesis, inferoposterior akinesis, mild MR, grade I diastolic dysfunction. LHC (1/14): AV LCx small with subtotal occlusion, 95% ostial instent restenosis in RCA. PCI in 2/14 to ostial RCA with 3.5 x 15 Xience DES.  Plavix allergy (hives).  13. Cervical OA.  14. Polymyalgia rheumatica 15. OSA: Severe on sleep study 10/12.  On CPAP.  16. Left lower leg hematoma in setting of Effient use.  He developed a left gastrocnemius abscess and septic shock with prolonged hospitalization beginning in 7/13.   Family History:  Father died with MI at age 35. He was an alcoholic. Mother died with cancer at around 64.   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    Current Outpatient Prescriptions  Medication Sig Dispense Refill  . aspirin EC 81 MG tablet Take 81 mg by mouth daily.      Marland Kitchen lisinopril (PRINIVIL,ZESTRIL) 20 MG tablet Take 1 tablet (20 mg total) by mouth daily.  30 tablet  6  . metFORMIN (GLUCOPHAGE) 500 MG tablet Take 1 tablet (500 mg total) by mouth 2 (two) times daily with a meal. Start taking on 01/27/13  60 tablet  1  . Multiple Vitamins-Minerals (CENTRUM SILVER ADULT 50+) TABS Take 1 tablet by mouth daily.      . rosuvastatin (CRESTOR) 20 MG tablet Take 1 tablet (20 mg total) by mouth daily.  30 tablet  11  . ticlopidine (TICLID) 250 MG tablet Take 1 tablet (250 mg total) by mouth 2 (two) times daily.  60 tablet  6  . carvedilol (COREG) 25 MG tablet Take 1 tablet (25 mg total) by mouth 2 (two) times daily.  180 tablet  3   No current facility-administered medications for this visit.    BP 120/68  Pulse 86  Ht 5\' 10"  (1.778 m)  Wt 239 lb (108.41 kg)  BMI 34.29 kg/m2 General: NAD, obese.  Neck: Thick, JVP 7 cm, no thyromegaly or thyroid nodule.  Lungs: Clear to auscultation bilaterally with normal  respiratory effort.  CV: Nondisplaced PMI. Heart regular S1/S2, no S3/S4, no murmur. 1+ left ankle edema. No carotid bruit. Normal pedal pulses.  Abdomen: Soft, nontender, no hepatosplenomegaly, no distention.  Neurologic: Alert and oriented x 3.  Psych: Normal affect.  Extremities: No clubbing or cyanosis.   Assessment/Plan:  CORONARY ATHEROSCLEROSIS NATIVE CORONARY ARTERY  No chest pain but he had a dramatic fall in his EF compared to 2/13 (45% => 15%).  He had been off Effient due to the left leg hematoma and was off aspirin as well for a long period (though this had been started back).  LHC in 1/14 showed tight in-stent restenosis in the ostial RCA  that was treated with a Xience DES.  He now feels like he has more energy and minimal exertional dyspnea.  Unfortunately, he developed a Plavix allergy with severe hives and quit taking it.  I had to put him on ticlopidine as really the only good alternative at this point (need to avoid Effient and ticagrelor given his severe left leg complications).  He has tolerated ticlodipine so far with no significant changes in his CBC.  Neutrophil counts has remained normal, as have the plts.  He will need to get a CBC every 2 wks for the first 3 months of ticlodipine use.  I told him about the hematologic side effect risk.  Continue ASA 81 and Crestor.  He will need to continue ticlopidine at least a year. He does not want to do cardiac rehab as he is concerned it will cause more pain in his left leg.   HYPERLIPIDEMIA  He has been taking his Crestor.  LDL is not ideal but acceptable for now.  He is having trouble with Crestor expense: I will try to help him out with samples.  He does not tolerate atorvastatin due to myalgias. Systolic CHF, chronic EF was considerably worsened compared to prior echo (down to 15% from 45%) in setting of tight stenosis in the ostial RCA.  He is near-euvolemic on exam, NYHA class II symptoms. As above, he has had PCI to RCA.  - No  indication for Lasix at this point.  - Increase Coreg to 25 mg bid.  - Continue lisinopril 20 mg daily.  - Will need echo in 5/14, if still < 35% should get ICD.    Marca Ancona 03/23/2013

## 2013-03-23 NOTE — Patient Instructions (Addendum)
Increase coreg(carvedilol) to 25mg  two times a day. You can take 2 of your 12.5mg  tablets two times a day and use your current supply.  Your physician recommends that you have lab work today--BMET/CBCD  Reschedule the echo appointment you already have scheduled for May 6 to the end of May.  Your physician recommends that you schedule a follow-up appointment in: 2 months with Dr Shirlee Latch.

## 2013-03-24 ENCOUNTER — Other Ambulatory Visit: Payer: Self-pay | Admitting: *Deleted

## 2013-03-24 DIAGNOSIS — I5022 Chronic systolic (congestive) heart failure: Secondary | ICD-10-CM

## 2013-03-24 DIAGNOSIS — I2510 Atherosclerotic heart disease of native coronary artery without angina pectoris: Secondary | ICD-10-CM

## 2013-04-06 ENCOUNTER — Other Ambulatory Visit (INDEPENDENT_AMBULATORY_CARE_PROVIDER_SITE_OTHER): Payer: Medicare Other

## 2013-04-06 DIAGNOSIS — I5022 Chronic systolic (congestive) heart failure: Secondary | ICD-10-CM

## 2013-04-06 LAB — CBC WITH DIFFERENTIAL/PLATELET
Basophils Relative: 0.3 % (ref 0.0–3.0)
Eosinophils Absolute: 0.4 10*3/uL (ref 0.0–0.7)
Eosinophils Relative: 5.9 % — ABNORMAL HIGH (ref 0.0–5.0)
Hemoglobin: 13.3 g/dL (ref 13.0–17.0)
Lymphocytes Relative: 23 % (ref 12.0–46.0)
MCHC: 33.4 g/dL (ref 30.0–36.0)
MCV: 86.1 fl (ref 78.0–100.0)
Monocytes Absolute: 0.5 10*3/uL (ref 0.1–1.0)
Neutro Abs: 3.9 10*3/uL (ref 1.4–7.7)
RBC: 4.63 Mil/uL (ref 4.22–5.81)
WBC: 6.2 10*3/uL (ref 4.5–10.5)

## 2013-04-19 ENCOUNTER — Other Ambulatory Visit: Payer: Medicare Other

## 2013-04-19 ENCOUNTER — Other Ambulatory Visit (HOSPITAL_COMMUNITY): Payer: Medicare Other

## 2013-04-20 ENCOUNTER — Other Ambulatory Visit (INDEPENDENT_AMBULATORY_CARE_PROVIDER_SITE_OTHER): Payer: Medicare Other

## 2013-04-20 DIAGNOSIS — I5022 Chronic systolic (congestive) heart failure: Secondary | ICD-10-CM

## 2013-04-20 LAB — CBC WITH DIFFERENTIAL/PLATELET
Basophils Absolute: 0 10*3/uL (ref 0.0–0.1)
Eosinophils Relative: 6.4 % — ABNORMAL HIGH (ref 0.0–5.0)
Hemoglobin: 13.4 g/dL (ref 13.0–17.0)
Lymphocytes Relative: 23.6 % (ref 12.0–46.0)
Monocytes Relative: 9.2 % (ref 3.0–12.0)
Neutro Abs: 3.2 10*3/uL (ref 1.4–7.7)
RBC: 4.66 Mil/uL (ref 4.22–5.81)
RDW: 13.5 % (ref 11.5–14.6)
WBC: 5.3 10*3/uL (ref 4.5–10.5)

## 2013-04-26 ENCOUNTER — Other Ambulatory Visit (HOSPITAL_COMMUNITY): Payer: Self-pay | Admitting: Cardiology

## 2013-05-04 ENCOUNTER — Ambulatory Visit (INDEPENDENT_AMBULATORY_CARE_PROVIDER_SITE_OTHER): Payer: Medicare Other | Admitting: *Deleted

## 2013-05-04 DIAGNOSIS — I10 Essential (primary) hypertension: Secondary | ICD-10-CM

## 2013-05-04 LAB — CBC
Hemoglobin: 13.8 g/dL (ref 13.0–17.0)
MCHC: 33.6 g/dL (ref 30.0–36.0)
Platelets: 194 10*3/uL (ref 150.0–400.0)

## 2013-05-10 ENCOUNTER — Ambulatory Visit (HOSPITAL_COMMUNITY): Payer: Medicare Other | Attending: Cardiology | Admitting: Radiology

## 2013-05-10 DIAGNOSIS — E785 Hyperlipidemia, unspecified: Secondary | ICD-10-CM | POA: Diagnosis not present

## 2013-05-10 DIAGNOSIS — G4733 Obstructive sleep apnea (adult) (pediatric): Secondary | ICD-10-CM | POA: Insufficient documentation

## 2013-05-10 DIAGNOSIS — I2510 Atherosclerotic heart disease of native coronary artery without angina pectoris: Secondary | ICD-10-CM

## 2013-05-10 DIAGNOSIS — R609 Edema, unspecified: Secondary | ICD-10-CM | POA: Insufficient documentation

## 2013-05-10 DIAGNOSIS — I2589 Other forms of chronic ischemic heart disease: Secondary | ICD-10-CM | POA: Diagnosis not present

## 2013-05-10 DIAGNOSIS — R0789 Other chest pain: Secondary | ICD-10-CM | POA: Insufficient documentation

## 2013-05-10 DIAGNOSIS — I10 Essential (primary) hypertension: Secondary | ICD-10-CM | POA: Diagnosis not present

## 2013-05-10 DIAGNOSIS — I080 Rheumatic disorders of both mitral and aortic valves: Secondary | ICD-10-CM | POA: Diagnosis not present

## 2013-05-10 DIAGNOSIS — I5022 Chronic systolic (congestive) heart failure: Secondary | ICD-10-CM | POA: Insufficient documentation

## 2013-05-10 DIAGNOSIS — E119 Type 2 diabetes mellitus without complications: Secondary | ICD-10-CM | POA: Diagnosis not present

## 2013-05-10 DIAGNOSIS — I509 Heart failure, unspecified: Secondary | ICD-10-CM | POA: Diagnosis not present

## 2013-05-10 NOTE — Progress Notes (Signed)
Echocardiogram performed.  

## 2013-05-11 ENCOUNTER — Telehealth: Payer: Self-pay | Admitting: Cardiology

## 2013-05-11 NOTE — Telephone Encounter (Signed)
New Problem  Pt is calling regarding his ECHO results. He said he missed your call yesterday afternoon.

## 2013-05-11 NOTE — Telephone Encounter (Signed)
Called patient to report ECHO results and per Dr. Alford Highland message I scheduled patient for an o/v on 6/4 due to his reluctance to be referred to EP for an ICD.  Patient verbalized understanding of results and upcoming appointment.

## 2013-05-18 ENCOUNTER — Ambulatory Visit (INDEPENDENT_AMBULATORY_CARE_PROVIDER_SITE_OTHER): Payer: Medicare Other | Admitting: Cardiology

## 2013-05-18 ENCOUNTER — Encounter: Payer: Self-pay | Admitting: Cardiology

## 2013-05-18 VITALS — BP 132/86 | HR 86 | Ht 70.0 in | Wt 239.0 lb

## 2013-05-18 DIAGNOSIS — R0602 Shortness of breath: Secondary | ICD-10-CM | POA: Diagnosis not present

## 2013-05-18 DIAGNOSIS — I509 Heart failure, unspecified: Secondary | ICD-10-CM

## 2013-05-18 DIAGNOSIS — I2510 Atherosclerotic heart disease of native coronary artery without angina pectoris: Secondary | ICD-10-CM

## 2013-05-18 DIAGNOSIS — I5022 Chronic systolic (congestive) heart failure: Secondary | ICD-10-CM

## 2013-05-18 DIAGNOSIS — E785 Hyperlipidemia, unspecified: Secondary | ICD-10-CM

## 2013-05-18 MED ORDER — ROSUVASTATIN CALCIUM 40 MG PO TABS: 40.0000 mg | ORAL_TABLET | Freq: Every day | ORAL | Status: DC

## 2013-05-18 MED ORDER — SPIRONOLACTONE 25 MG PO TABS: ORAL_TABLET | ORAL | Status: DC

## 2013-05-18 MED ORDER — ROSUVASTATIN CALCIUM 40 MG PO TABS: ORAL_TABLET | ORAL | Status: DC

## 2013-05-18 NOTE — Patient Instructions (Addendum)
Start spironolactone 12.5mg  daily. This will be 1/2 of a 25mg  tablet daily.  Your physician recommends that you return for lab work in: 2 weeks--BMET/BNP/CBCd   Your physician recommends that you schedule a follow-up appointment in: 3 months with Dr Shirlee Latch.    Your physician has requested that you have an echocardiogram. Echocardiography is a painless test that uses sound waves to create images of your heart. It provides your doctor with information about the size and shape of your heart and how well your heart's chambers and valves are working. This procedure takes approximately one hour. There are no restrictions for this procedure. In 6 MONTHS

## 2013-05-19 NOTE — Progress Notes (Signed)
Patient ID: Wayne [name], male   DOB: [birth year], 72 y.o.   MRN: [id number] PCP: Dr. Caryl Never  72 yo with history of DM, HTN, CAD, and ischemic cardiomyopathy presents for cardiology followup. Patient had a CHF exacerbation in 2/12 and was found to have LV systolic dysfunction with EF around 20%. LHC showed RCA, ramus, and CFX disease. RCA was subtotally occluded. Cardiac MRI showed that all walls, including the inferior wall, should be viable. Patient therefore underwent opening of his chronic totally occluded RCA as well as PCI to the ramus in 2/12. He received drug eluting stents and was on Effient.  He had an echo in 2/13 that showed EF improved to 45% with moderate LV dilation and mild LV hypertrophy.   In 5/13, he developed a large left lower leg hematoma.  He was still on Effient at that time.  ASA and Effient were stopped.  The hematoma did not resolve.  In 7/13, he was re-admitted with septic shock from MSSA from an abscess in his left gastrocnemius.  He also grew Pseudomonas from the left gastrocnemius as well.  He had a prolonged course in the hospital and later in a rehab facility.  Ultimately, he got back home again.  I had him get an echo in 1/14, and this showed EF 15% with diffuse hypokinesis and inferoposterior akinesis.  He had been off of most of his prior cardiac medications.  I took him back for [facility] in 1/14.  This showed subtotal occlusion of a small AV LCx and 95% ostial in-stent restenosis in the RCA.  He was treated in 2/14 with a Xience DES to the ostial RCA and begun on Plavix.  Unfortunately, he developed diffuse hives after starting Plavix and had to be switched to ticlopidine.  He has tolerated ticlopidine.    Symptomatically, he seems to be doing quite well.  He continues to have the chronic pain in his left leg.  He has been doing some walking for exercise.  No exertional dyspnea or chest pain at this point.  No orthopnea or PND.  Echo done in 5/14 showed some improvement in LV  function but EF is still low at 30-35%.      Labs (3/12): TSH normal, ESR normal, HDL 34, LDL 77, LFTs normal, K 4.3, creatinine 0.9  Labs (4/12): K 4, creatinine 0.9, BNP 234 Labs (6/12): K 3.8, creatinine 0.72 Labs (9/12): K 4.4, creatinine 0.9, BNP 145, digoxin 0.8, hgbA1c 7.8 Labs (10/12): K 4, creatinine 0.7 Labs (2/13): K 4.5, creatinine 0.9, LDL 116, HDL 42 Labs (8/13): K 3.6, creatinine 1.25 Labs (2/14): K 4.2, creatinine 1.04 Labs (4/14): LDL 96, HDL 38, CBC today with with normal total WBCs, normal neutrophil count, normal plts.  Labs (5/14): HCT 41.1, Plts 194  Past Medical History:  1. HYPERTENSION  2. HYPERLIPIDEMIA  3. ECZEMA  4. RHINITIS  5. HERPES ZOSTER OPHTHALMICUS  6. ADENOCARCINOMA, PROSTATE: Status post prostatectomy in 2009. Has had some incontinence since then.  7. Diabetes mellitus type II  8. Arthritis  9. Obesity  10. GERD: rare  11. CAD: Presented with exertional dyspnea, never had chest pain. LHC (2/12) with subtotalled proximal RCA and left to right collaterals, 90% proximal moderate-sized ramus, 95% proximal relatively small CFX, 40-50% proximal LAD. Cardiac MRI (2/12) showed EF 21%, some mild scar in basal segments but all wall segments would be expected to be viable. DES x 5 (overlapping) to RCA, DES x 1 to RI 01/30/11 .  Bled into  leg with Effient use (long, complicated course).  LHC (1/14): AV LCx small with subtotal occlusion, 95% ostial instent restenosis in RCA. PCI in 2/14 to ostial RCA with 3.5 x 15 Xience DES.  Plavix allergy (hives) so put on ticlopidine.  12. Ischemic CMP: Echo (2/12) with moderately dilated LV, EF about 20% with diffuse hypokinesis and inferior akinesis, pseudonormal diastolic function, mild MR, severe LAE, mildly decreased RV systolic function. RHC (2/12) with mean RA 12, PA 40/25, mean PCWP 26, CI 2.1.  Echo (5/12) with EF 40% (appeared worse to my eye) with posterior HK, basal inferior AK, inferoseptal AK, basal anteroseptal AK,  mild MR.  Cardiac MRI was repeated and showed EF 32% (improved from 21%) and mild LV dilation (was severely dilated before) with diffuse hypokinesis and subendocardial scar in the basal inferior, basal posterior, and basal anterolateral segments.  Echo (2/13) with EF 45%, moderate LV dilation, mild LVH.  Echo (1/14) with EF 15%, diffuse hypokinesis, inferoposterior akinesis, mild MR, grade I diastolic dysfunction.  Echo (5/14) with EF 30-35%, mild LV dilation, akinesis of the basal inferior wall otherwise diffuse hypokinesis.  13. Cervical OA.  14. Polymyalgia rheumatica 15. OSA: Severe on sleep study 10/12.  On CPAP.  16. Left lower leg hematoma in setting of Effient use.  He developed a left gastrocnemius abscess and septic shock with prolonged hospitalization beginning in 7/13.   Family History:  Father died with MI at age 37. He was an alcoholic. Mother died with cancer at around 89.   Social History:  Occupation: retired [employer] architect  Married, lives in [place]  Past smoker, quit around 1990   ROS: all systems reviewed and negative except as per HPI.    Current Outpatient Prescriptions  Medication Sig Dispense Refill  . aspirin EC 81 MG tablet Take 81 mg by mouth daily.      . carvedilol (COREG) 25 MG tablet Take 1 tablet (25 mg total) by mouth 2 (two) times daily.  180 tablet  3  . lisinopril (PRINIVIL,ZESTRIL) 20 MG tablet Take 1 tablet (20 mg total) by mouth daily.  30 tablet  6  . metFORMIN (GLUCOPHAGE) 500 MG tablet Take 1 tablet (500 mg total) by mouth 2 (two) times daily with a meal. Start taking on 01/27/13  60 tablet  1  . Multiple Vitamins-Minerals (CENTRUM SILVER ADULT 50+) TABS Take 1 tablet by mouth daily.      . ticlopidine (TICLID) 250 MG tablet Take 1 tablet (250 mg total) by mouth 2 (two) times daily.  60 tablet  6  . rosuvastatin (CRESTOR) 40 MG tablet Take 1 tablet (40 mg total) by mouth daily.  90 tablet  3  . spironolactone (ALDACTONE) 25 MG tablet 1/2 tablet  (total 12.5mg ) daily  45 tablet  3   No current facility-administered medications for this visit.    BP 132/86  Pulse 86  Ht 5\' 10"  (1.778 m)  Wt 239 lb (108.41 kg)  BMI 34.29 kg/m2  SpO2 98% General: NAD, obese.  Neck: Thick, JVP 7 cm, no thyromegaly or thyroid nodule.  Lungs: Clear to auscultation bilaterally with normal respiratory effort.  CV: Nondisplaced PMI. Heart regular S1/S2, no S3/S4, no murmur. Trace left ankle edema. No carotid bruit. Normal pedal pulses.  Abdomen: Soft, nontender, no hepatosplenomegaly, no distention.  Neurologic: Alert and oriented x 3.  Psych: Normal affect.  Extremities: No clubbing or cyanosis.   Assessment/Plan:  CORONARY ATHEROSCLEROSIS NATIVE CORONARY ARTERY  Doing well symptomatically after PCI.  He was allergic to Plavix and had severe gastrocnemius bleed with Effient. He has tolerated ticlodipine so far with no significant changes in his CBC.  Neutrophil counts has remained normal, as have the plts.  He has been getting a CBC every 2 wks for the first 3 months of ticlodipine use (one more bimonthly CBC to be done).  Continue ASA 81 and Crestor.  He will need to continue ticlopidine at least a year.  HYPERLIPIDEMIA  He has been taking his Crestor.  LDL is not ideal but acceptable for now.  He is having trouble with Crestor expense: I will try to help him out with samples.  He does not tolerate atorvastatin due to myalgias. Systolic CHF, chronic EF improved but still < 35% on echo this month.  He is euvolemic on exam, NYHA class II symptoms. - No indication for Lasix at this point.  - Continue Coreg and lisinopril. - Add spironolactone 12.5 mg daily with BMET/BNP in 2 wks.  - We discussed ICD today.  He really wants to see if his LV function continues to improve before having device placed.  As above, I will add spironolactone today and we will repeat his echo in 6 months.  If EF still low at that point, I will again recommend that he get ICD.     Marca Ancona 05/19/2013

## 2013-05-23 DIAGNOSIS — M1710 Unilateral primary osteoarthritis, unspecified knee: Secondary | ICD-10-CM | POA: Diagnosis not present

## 2013-05-31 ENCOUNTER — Ambulatory Visit: Payer: Medicare Other | Admitting: Cardiology

## 2013-06-07 ENCOUNTER — Other Ambulatory Visit (INDEPENDENT_AMBULATORY_CARE_PROVIDER_SITE_OTHER): Payer: Medicare Other

## 2013-06-07 DIAGNOSIS — R0602 Shortness of breath: Secondary | ICD-10-CM

## 2013-06-07 DIAGNOSIS — I5022 Chronic systolic (congestive) heart failure: Secondary | ICD-10-CM

## 2013-06-07 LAB — CBC WITH DIFFERENTIAL/PLATELET
Basophils Relative: 0.2 % (ref 0.0–3.0)
Eosinophils Relative: 3.9 % (ref 0.0–5.0)
Hemoglobin: 13.7 g/dL (ref 13.0–17.0)
Lymphocytes Relative: 21.4 % (ref 12.0–46.0)
Monocytes Relative: 7.9 % (ref 3.0–12.0)
Neutrophils Relative %: 66.6 % (ref 43.0–77.0)
RBC: 4.81 Mil/uL (ref 4.22–5.81)
WBC: 6.5 10*3/uL (ref 4.5–10.5)

## 2013-06-07 LAB — BASIC METABOLIC PANEL
BUN: 20 mg/dL (ref 6–23)
CO2: 22 mEq/L (ref 19–32)
Chloride: 106 mEq/L (ref 96–112)
Glucose, Bld: 326 mg/dL — ABNORMAL HIGH (ref 70–99)
Potassium: 4.5 mEq/L (ref 3.5–5.1)

## 2013-06-07 LAB — BRAIN NATRIURETIC PEPTIDE: Pro B Natriuretic peptide (BNP): 126 pg/mL — ABNORMAL HIGH (ref 0.0–100.0)

## 2013-07-15 IMAGING — CR DG CHEST 1V PORT
1 series · 1 of 1 positions shown · non-contrast
Comparison: 06/14/2012

CLINICAL DATA: Intubation.

PORTABLE CHEST - 1 VIEW

[AP]
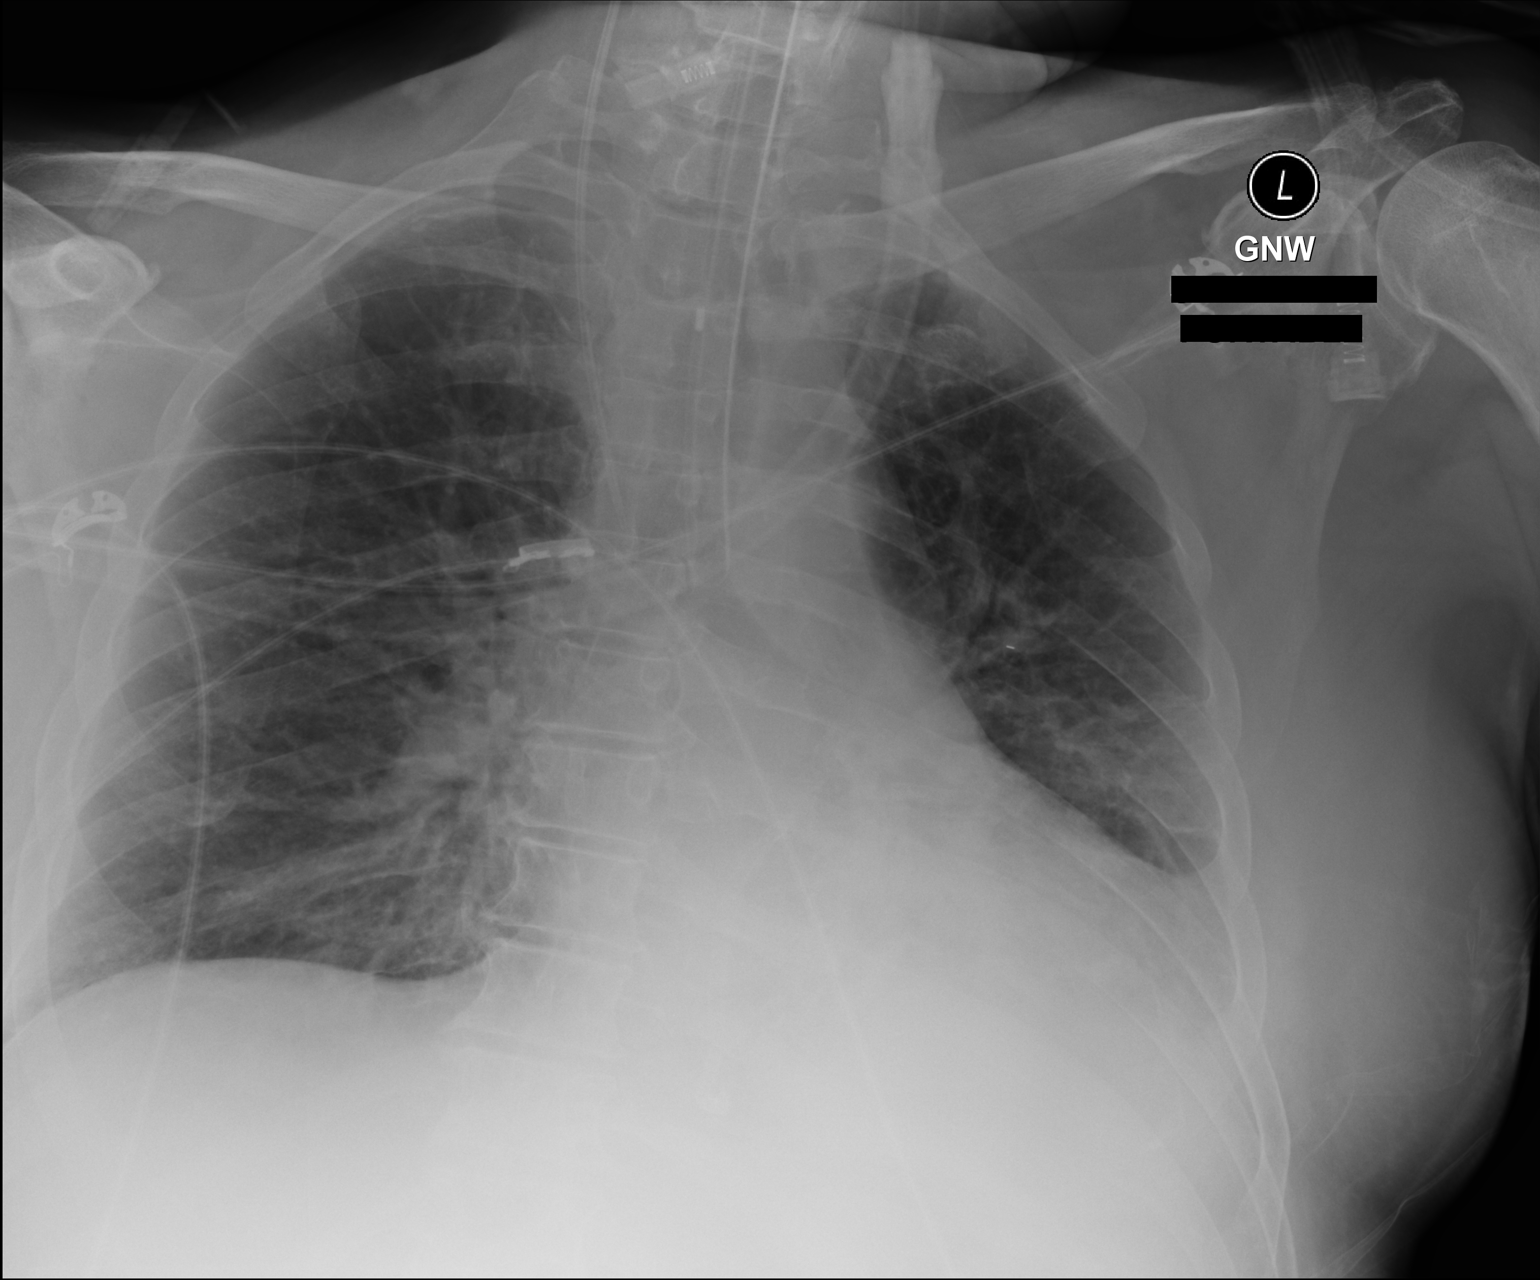

[1 of 1 positions shown; findings below may reference images not displayed]

FINDINGS: Endotracheal tube is 6 mm above the level of the carina,
directed toward the left main stem bronchus.  Recommend retracting
2 cm.  Right central line tip remains in the SVC, unchanged.
Increasing bibasilar atelectasis or infiltrates, left greater than
right.  Cardiomegaly. Possible small left effusion.
IMPRESSION: Endotracheal tube near the level of the carina, directed toward the
left main stem bronchus.  Recommend retracting 2 cm.

Increasing bibasilar atelectasis or infiltrates, left greater than
right with small left effusion suspected.

## 2013-07-15 IMAGING — CR DG CHEST 1V PORT
1 series · 1 of 1 positions shown · non-contrast
Comparison: 06/14/2012

CLINICAL DATA: Evaluate IJ catheter placement

PORTABLE CHEST - 1 VIEW

[AP]
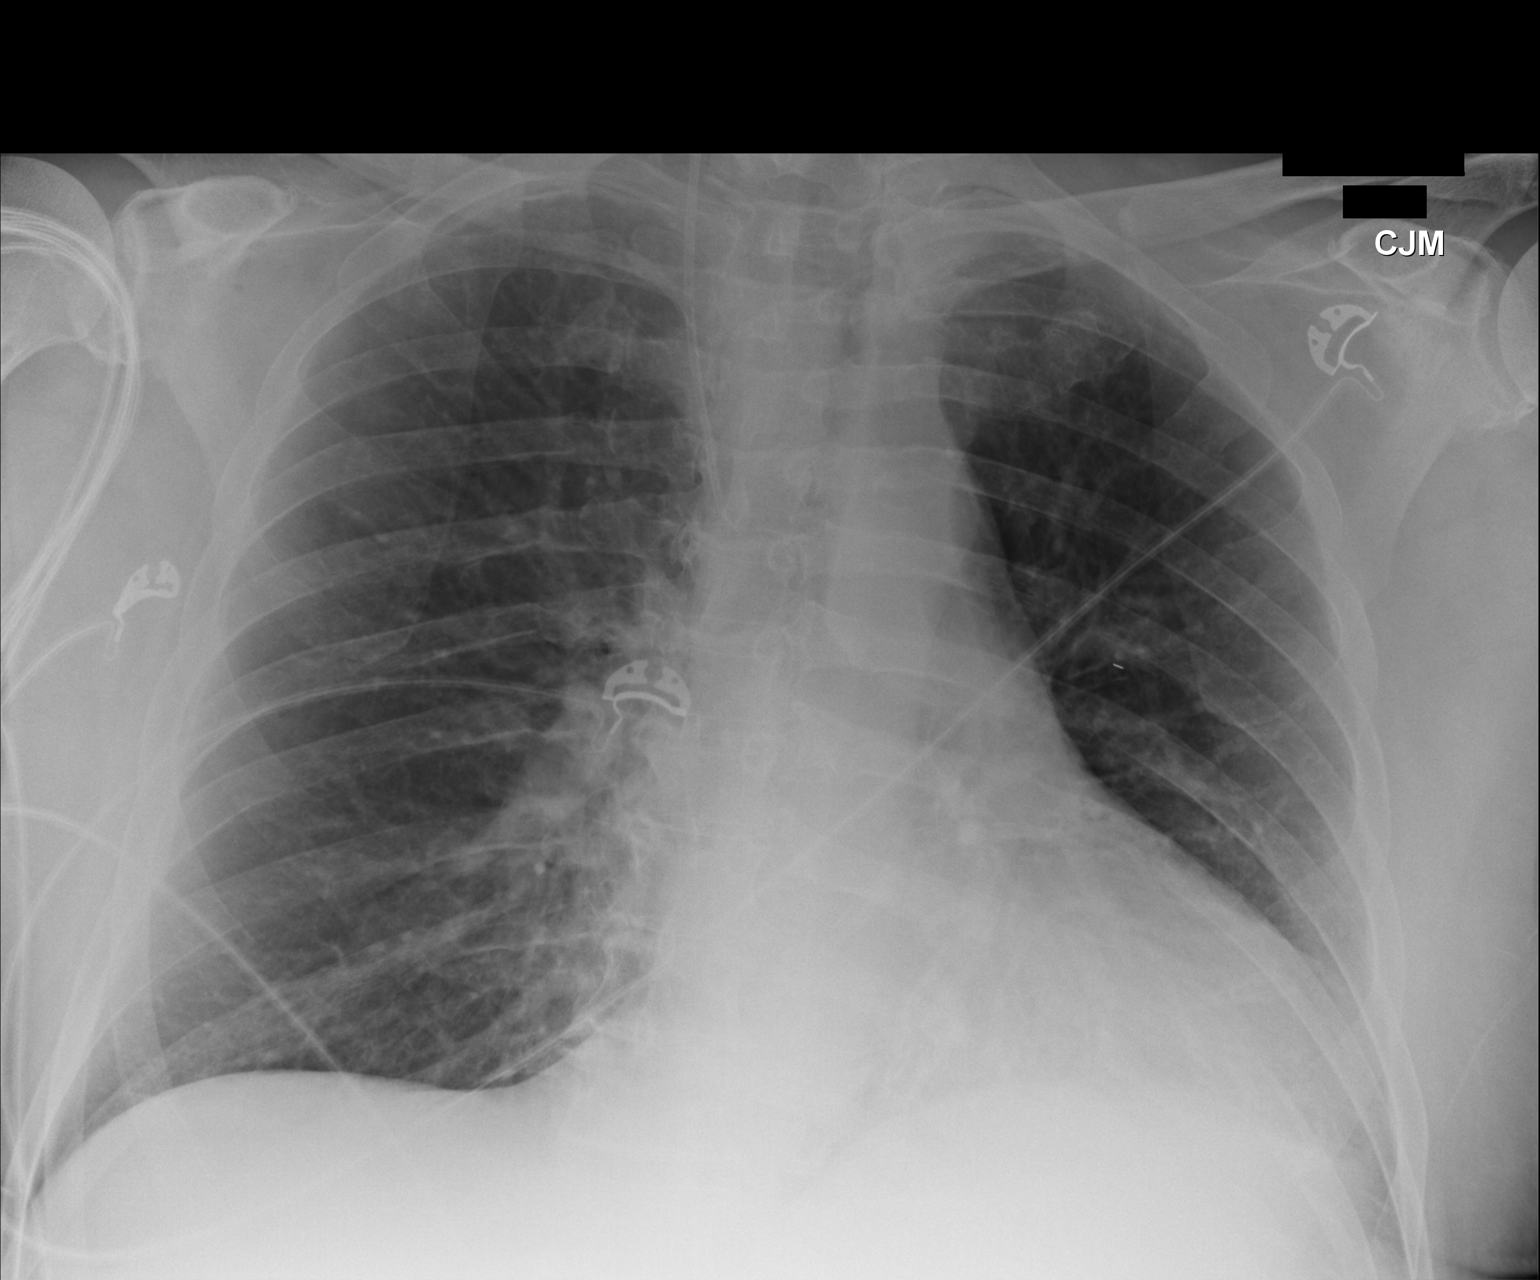

[1 of 1 positions shown; findings below may reference images not displayed]

FINDINGS: Right IJ catheter tip is in the SVC.  No pneumothorax
identified.  Heart size is mildly enlarged.  No pleural effusion or
edema.  No airspace consolidation.
IMPRESSION: The right IJ catheter is in satisfactory position within the SVC.
No pneumothorax identified.

## 2013-07-16 IMAGING — CR DG CHEST 1V PORT
1 series · 1 of 1 positions shown · non-contrast
Comparison: Portable chest x-rays yesterday and two-view chest x-
ray 04/11/2012.

CLINICAL DATA: Ventilator dependent respiratory failure.  Follow up
basilar atelectasis and/or pneumonia.

PORTABLE CHEST - 1 VIEW [DATE]/[id number] hours:

[AP]
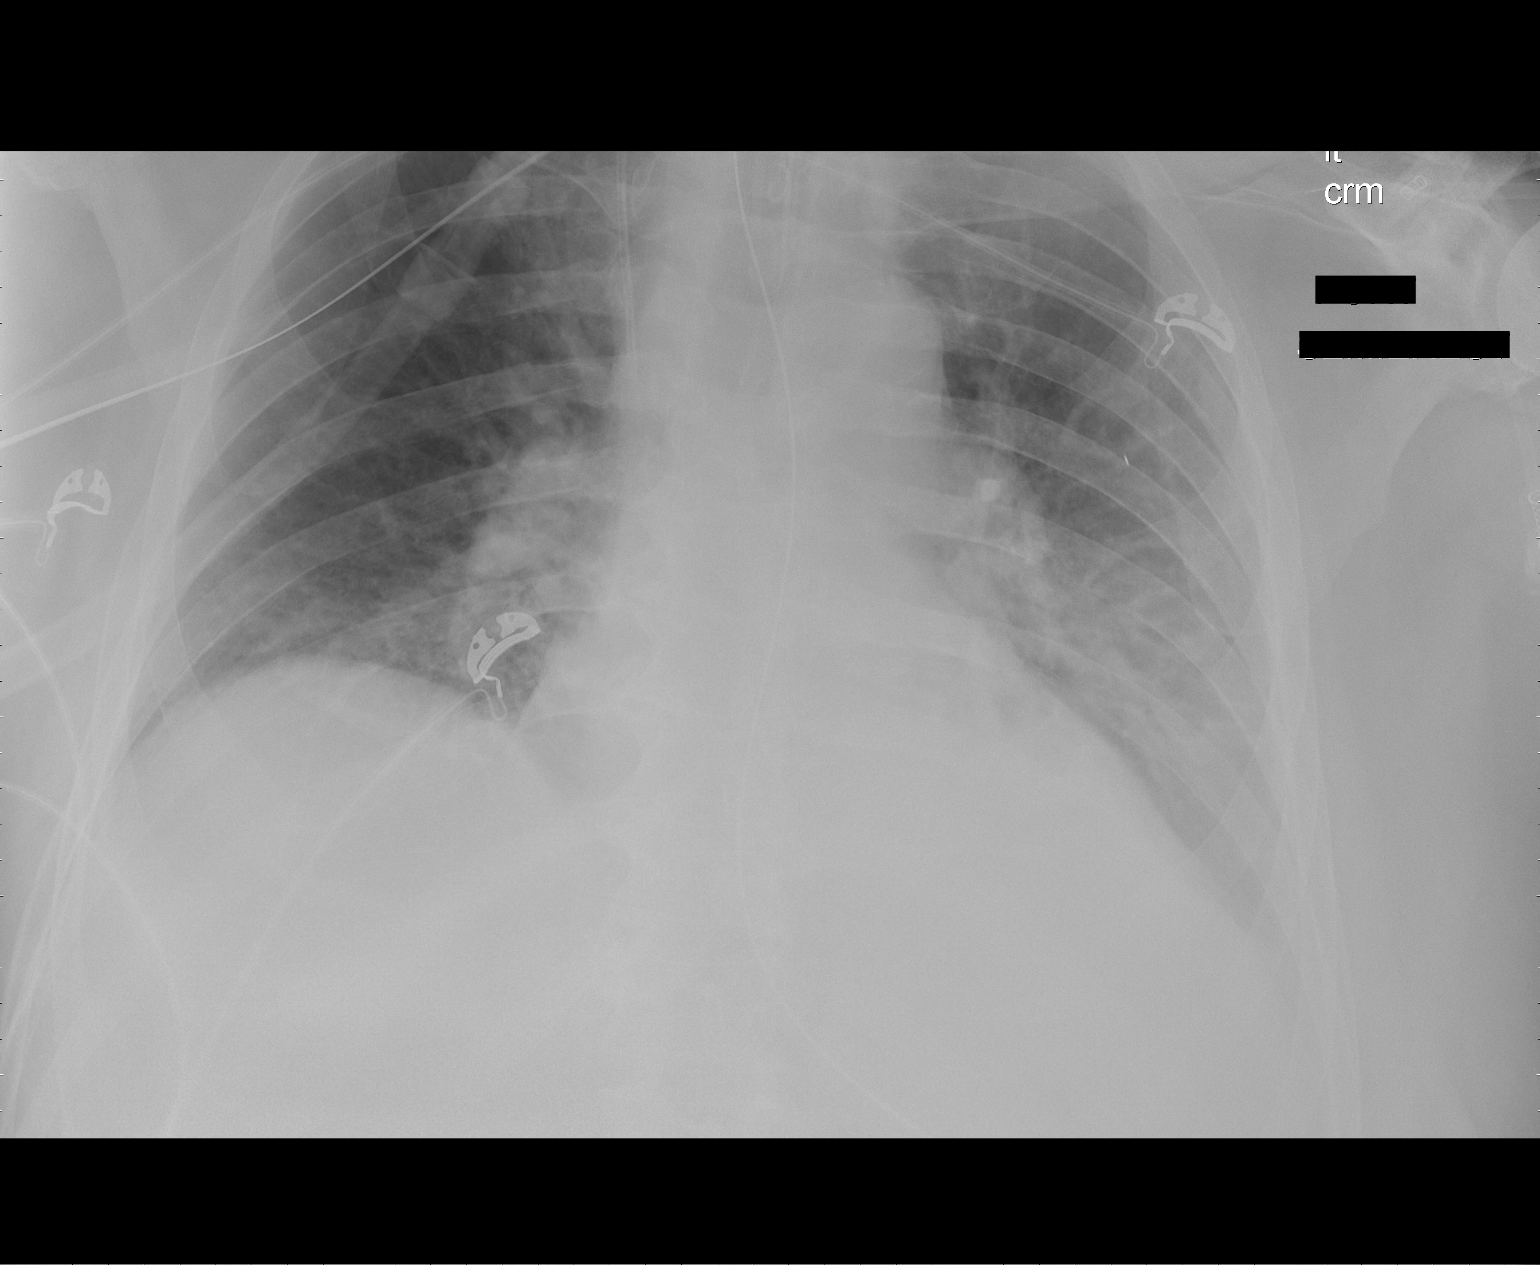

[1 of 1 positions shown; findings below may reference images not displayed]

FINDINGS: Endotracheal tube withdrawn such that its tip is now in
satisfactory position approximately 4 cm above the carina.
Nasogastric tube courses below the diaphragm into the stomach.
Right jugular central venous catheter tip in the SVC.  Stable dense
consolidation in the left lower lobe and mild patchy opacities at
the right lung base.  No new pulmonary parenchymal abnormalities.
Cardiac silhouette mildly enlarged but stable.  Pulmonary
vascularity normal.
IMPRESSION: 1.  Endotracheal tube tip now in satisfactory position
approximately 4 cm above the carina.  Remaining support apparatus
satisfactory.
2.  Stable dense left lower lobe atelectasis and/or pneumonia and
mild patchy right basilar atelectasis and/or pneumonia.  No new
abnormalities.

## 2013-07-17 IMAGING — CR DG CHEST 1V PORT
1 series · 1 of 1 positions shown · non-contrast
Comparison: June 15, 2012

CLINICAL DATA: Check endotracheal tube

PORTABLE CHEST - 1 VIEW

[AP]
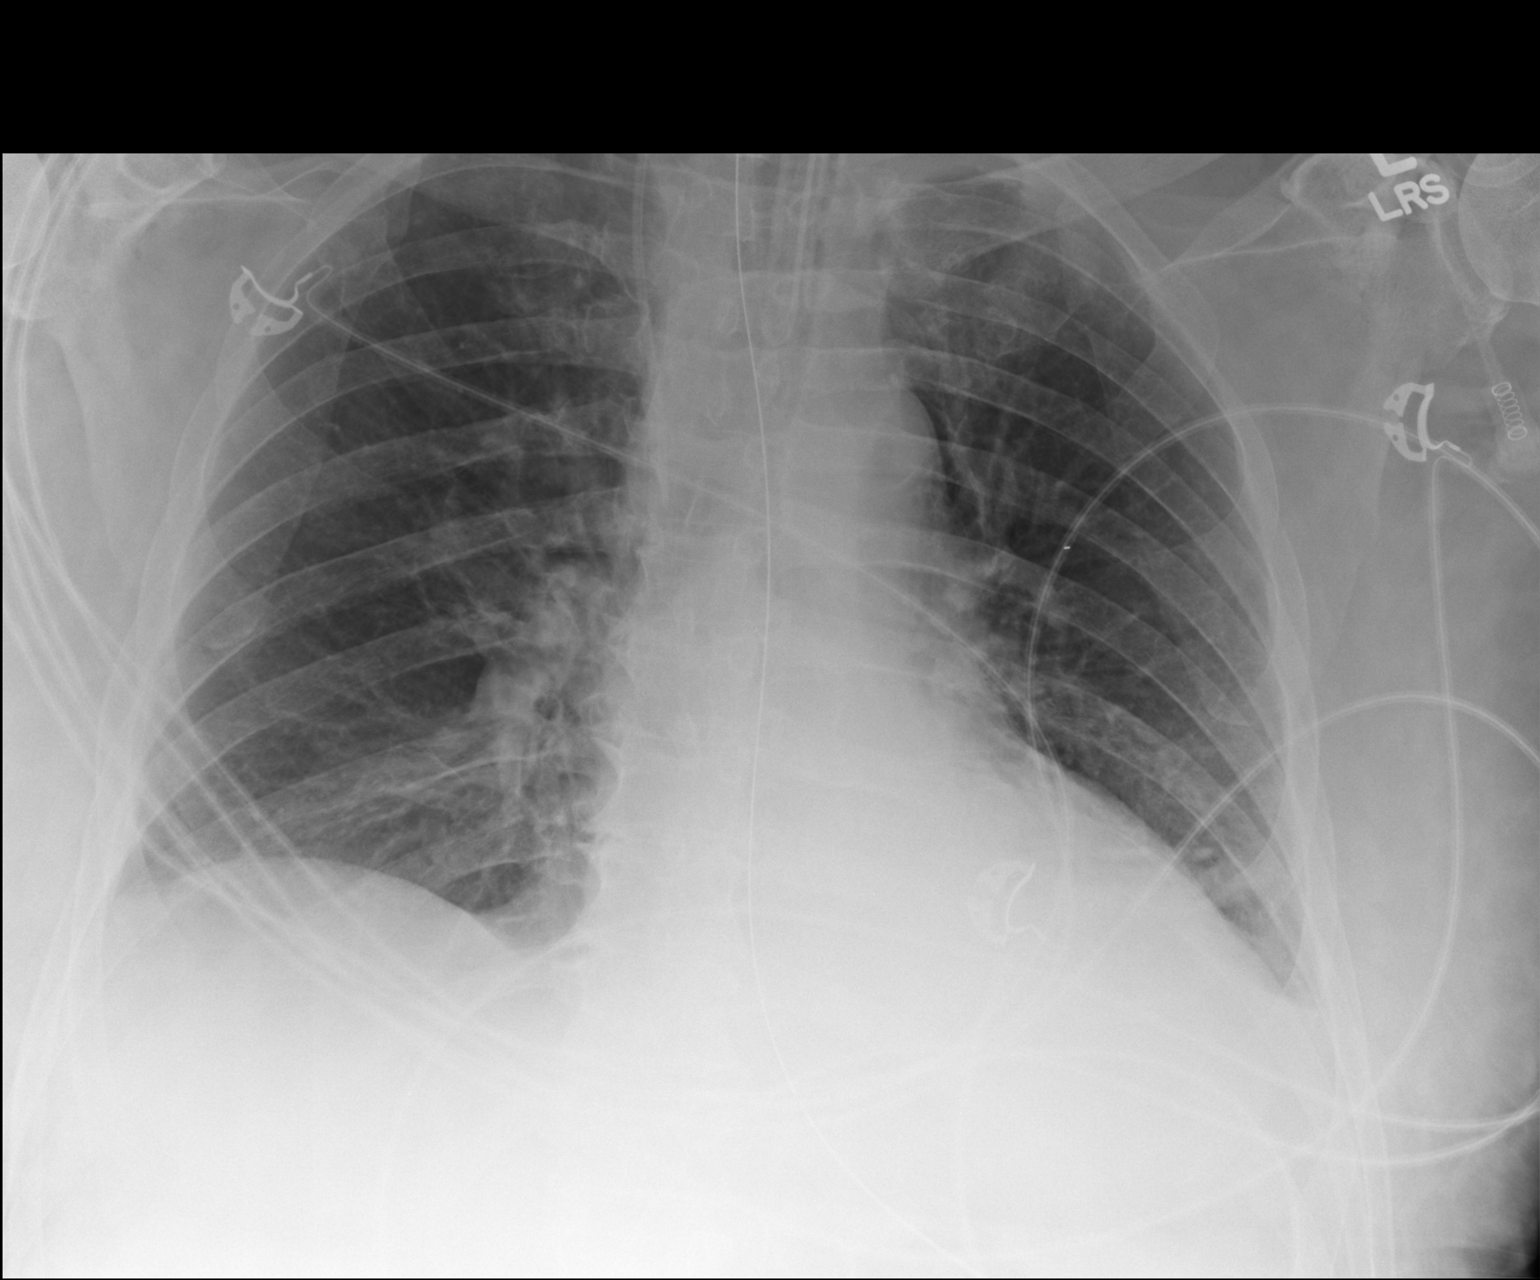

[1 of 1 positions shown; findings below may reference images not displayed]

FINDINGS: Stable tubular devices.  Bibasilar airspace disease
verses atelectasis left greater than right improved.  Left apical
pleural thickening likely related to pleural fluid in a recumbent
patient.  No pneumothorax.  Normal vascularity.
IMPRESSION: Improved bibasilar airspace disease verses atelectasis.

## 2013-07-18 IMAGING — CR DG CHEST 1V PORT
1 series · 1 of 1 positions shown · non-contrast
Comparison: Portable chest x-ray of 06/16/2012

CLINICAL DATA: Evaluate endotracheal tube placement

PORTABLE CHEST - 1 VIEW

[AP]
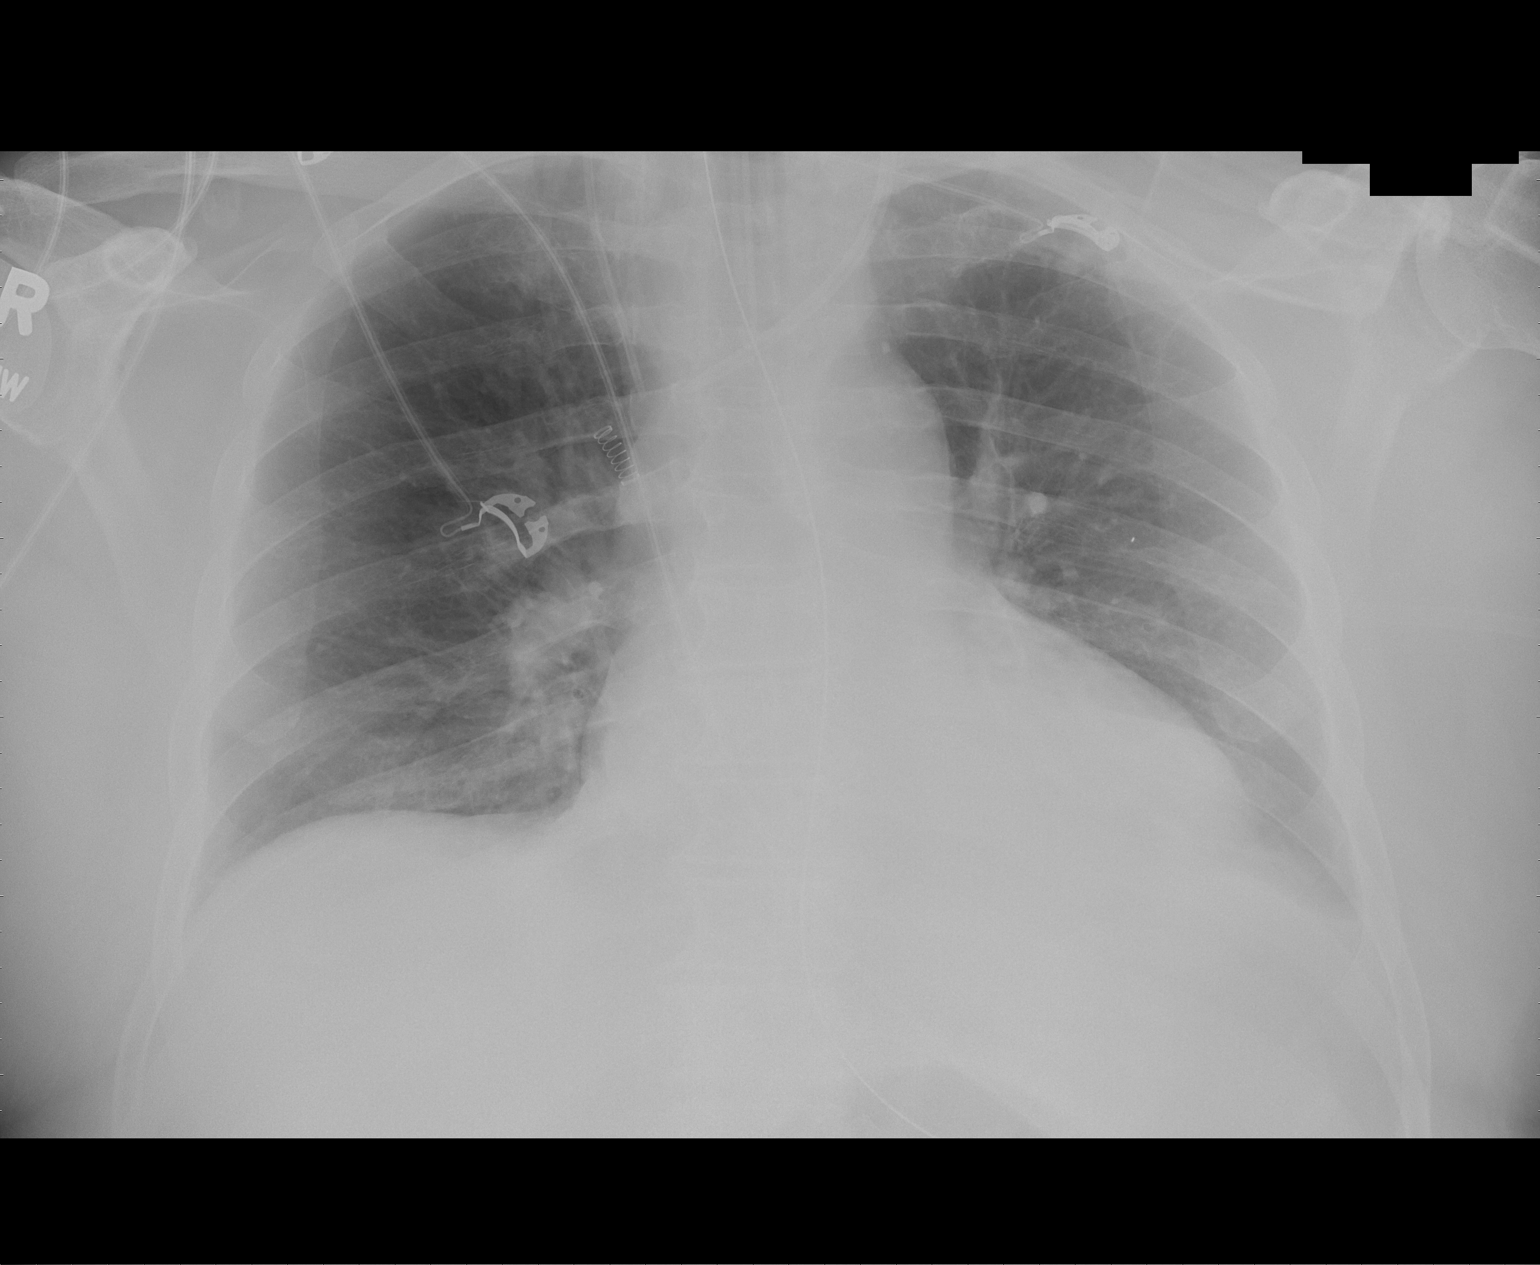

[1 of 1 positions shown; findings below may reference images not displayed]

FINDINGS: The tip of the endotracheal tube is approximately 4.8 cm
above the carina. Opacity at the left lung base remains although
aeration has improved slightly.  There may be a small left pleural
effusion present.  Cardiomegaly is stable.  Left central venous
line and right central venous line are unchanged in position.
IMPRESSION: 1.  Tip of endotracheal tube approximately 4.3 cm above carina.
2.  Little change in left basilar opacity and cardiomegaly.

## 2013-07-19 ENCOUNTER — Telehealth: Payer: Self-pay | Admitting: *Deleted

## 2013-07-19 ENCOUNTER — Ambulatory Visit (INDEPENDENT_AMBULATORY_CARE_PROVIDER_SITE_OTHER): Payer: Medicare Other | Admitting: *Deleted

## 2013-07-19 DIAGNOSIS — I5022 Chronic systolic (congestive) heart failure: Secondary | ICD-10-CM

## 2013-07-19 DIAGNOSIS — R0602 Shortness of breath: Secondary | ICD-10-CM | POA: Diagnosis not present

## 2013-07-19 DIAGNOSIS — J209 Acute bronchitis, unspecified: Secondary | ICD-10-CM | POA: Diagnosis not present

## 2013-07-19 DIAGNOSIS — I10 Essential (primary) hypertension: Secondary | ICD-10-CM

## 2013-07-19 DIAGNOSIS — I509 Heart failure, unspecified: Secondary | ICD-10-CM

## 2013-07-19 DIAGNOSIS — I428 Other cardiomyopathies: Secondary | ICD-10-CM

## 2013-07-19 LAB — BASIC METABOLIC PANEL
BUN: 14 mg/dL (ref 6–23)
CO2: 24 mEq/L (ref 19–32)
Chloride: 105 mEq/L (ref 96–112)
Creatinine, Ser: 1 mg/dL (ref 0.4–1.5)
Glucose, Bld: 334 mg/dL — ABNORMAL HIGH (ref 70–99)

## 2013-07-19 NOTE — Telephone Encounter (Signed)
called wanting crestor samples, said he had spoke to [facility] about getting samples, i could not find any documentation, so i had ann call him back.

## 2013-08-23 ENCOUNTER — Encounter: Payer: Self-pay | Admitting: *Deleted

## 2013-08-23 ENCOUNTER — Ambulatory Visit (INDEPENDENT_AMBULATORY_CARE_PROVIDER_SITE_OTHER): Payer: Medicare Other | Admitting: Cardiology

## 2013-08-23 VITALS — BP 126/68 | HR 77 | Ht 70.0 in | Wt 241.0 lb

## 2013-08-23 DIAGNOSIS — R0602 Shortness of breath: Secondary | ICD-10-CM

## 2013-08-23 DIAGNOSIS — E1365 Other specified diabetes mellitus with hyperglycemia: Secondary | ICD-10-CM

## 2013-08-23 DIAGNOSIS — E785 Hyperlipidemia, unspecified: Secondary | ICD-10-CM | POA: Diagnosis not present

## 2013-08-23 DIAGNOSIS — I509 Heart failure, unspecified: Secondary | ICD-10-CM

## 2013-08-23 DIAGNOSIS — I2510 Atherosclerotic heart disease of native coronary artery without angina pectoris: Secondary | ICD-10-CM

## 2013-08-23 DIAGNOSIS — I428 Other cardiomyopathies: Secondary | ICD-10-CM | POA: Diagnosis not present

## 2013-08-23 DIAGNOSIS — I5022 Chronic systolic (congestive) heart failure: Secondary | ICD-10-CM | POA: Diagnosis not present

## 2013-08-23 DIAGNOSIS — I798 Other disorders of arteries, arterioles and capillaries in diseases classified elsewhere: Secondary | ICD-10-CM

## 2013-08-23 DIAGNOSIS — E1351 Other specified diabetes mellitus with diabetic peripheral angiopathy without gangrene: Secondary | ICD-10-CM

## 2013-08-23 DIAGNOSIS — E119 Type 2 diabetes mellitus without complications: Secondary | ICD-10-CM

## 2013-08-23 MED ORDER — SPIRONOLACTONE 25 MG PO TABS: ORAL_TABLET | ORAL | Status: DC

## 2013-08-23 NOTE — Patient Instructions (Addendum)
Start spironolactone 12.5mg  daily-this will be 1/2 of a 25mg  tablet daily.   Your physician recommends that you return for a FASTING lipid profile /BMET/CBCd/HGB A1c in 10 days.   Your physician recommends that you schedule a follow-up appointment in: 2 months with Dr Shirlee Latch.   Keep the echocardiogram appointment you already have scheduled in December.

## 2013-08-24 NOTE — Progress Notes (Signed)
Patient ID: Wayne [name], male   DOB: 10-02-1941, 72 y.o.   MRN: [id number] PCP: Dr. Caryl Never  72 yo with history of DM, HTN, CAD, and ischemic cardiomyopathy presents for cardiology followup. Patient had a CHF exacerbation in 2/12 and was found to have LV systolic dysfunction with EF around 20%. LHC showed RCA, ramus, and CFX disease. RCA was subtotally occluded. Cardiac MRI showed that all walls, including the inferior wall, should be viable. Patient therefore underwent opening of his chronic totally occluded RCA as well as PCI to the ramus in 2/12. He received drug eluting stents and was on Effient.  He had an echo in 2/13 that showed EF improved to 45% with moderate LV dilation and mild LV hypertrophy.   In 5/13, he developed a large left lower leg hematoma.  He was still on Effient at that time.  ASA and Effient were stopped.  The hematoma did not resolve.  In 7/13, he was re-admitted with septic shock from MSSA from an abscess in his left gastrocnemius.  He also grew Pseudomonas from the left gastrocnemius as well.  He had a prolonged course in the hospital and later in a rehab facility.  Ultimately, he got back home again.  I had him get an echo in 1/14, and this showed EF 15% with diffuse hypokinesis and inferoposterior akinesis.  He had been off of most of his prior cardiac medications.  I took him back for [facility] in 1/14.  This showed subtotal occlusion of a small AV LCx and 95% ostial in-stent restenosis in the RCA.  He was treated in 2/14 with a Xience DES to the ostial RCA and begun on Plavix.  Unfortunately, he developed diffuse hives after starting Plavix and had to be switched to ticlopidine.  He has tolerated ticlopidine.   Echo done in 5/14 showed some improvement in LV function but EF was still low at 30-35%.  He did not want ICD at our last discussion.  Symptomatically, he is doing well.  He appears not to have started the spironolactone that I gave him at last appointment but is otherwise  taking his medications.  No chest pain.  He is short of breath carrying a heavy load or walking up a steep hill but otherwise denies dyspnea.  He continues to gain weight (2 lbs up) and blood glucose is poorly controlled.  He has not been following up with Dr. Caryl Never.  His diet is abysmal.      Labs (3/12): TSH normal, ESR normal, HDL 34, LDL 77, LFTs normal, K 4.3, creatinine 0.9  Labs (4/12): K 4, creatinine 0.9, BNP 234 Labs (6/12): K 3.8, creatinine 0.72 Labs (9/12): K 4.4, creatinine 0.9, BNP 145, digoxin 0.8, hgbA1c 7.8 Labs (10/12): K 4, creatinine 0.7 Labs (2/13): K 4.5, creatinine 0.9, LDL 116, HDL 42 Labs (8/13): K 3.6, creatinine 1.25 Labs (2/14): K 4.2, creatinine 1.04 Labs (4/14): LDL 96, HDL 38, CBC today with with normal total WBCs, normal neutrophil count, normal plts.  Labs (5/14): HCT 41.1, Plts 194 Labs (6/14): HCT 41.2, WBC 6.5, plts 162 Labs (8/14): K 4.1, creatinine 1.0, BNP 141  Past Medical History:  1. HYPERTENSION  2. HYPERLIPIDEMIA  3. ECZEMA  4. RHINITIS  5. HERPES ZOSTER OPHTHALMICUS  6. ADENOCARCINOMA, PROSTATE: Status post prostatectomy in 2009. Has had some incontinence since then.  7. Diabetes mellitus type II  8. Arthritis  9. Obesity  10. GERD: rare  11. CAD: Presented with exertional dyspnea, never had chest  pain. LHC (2/12) with subtotalled proximal RCA and left to right collaterals, 90% proximal moderate-sized ramus, 95% proximal relatively small CFX, 40-50% proximal LAD. Cardiac MRI (2/12) showed EF 21%, some mild scar in basal segments but all wall segments would be expected to be viable. DES x 5 (overlapping) to RCA, DES x 1 to RI 01/30/11 .  Bled into leg with Effient use (long, complicated course).  LHC (1/14): AV LCx small with subtotal occlusion, 95% ostial instent restenosis in RCA. PCI in 2/14 to ostial RCA with 3.5 x 15 Xience DES.  Plavix allergy (hives) so put on ticlopidine.  12. Ischemic CMP: Echo (2/12) with moderately dilated LV, EF  about 20% with diffuse hypokinesis and inferior akinesis, pseudonormal diastolic function, mild MR, severe LAE, mildly decreased RV systolic function. RHC (2/12) with mean RA 12, PA 40/25, mean PCWP 26, CI 2.1.  Echo (5/12) with EF 40% (appeared worse to my eye) with posterior HK, basal inferior AK, inferoseptal AK, basal anteroseptal AK, mild MR.  Cardiac MRI was repeated and showed EF 32% (improved from 21%) and mild LV dilation (was severely dilated before) with diffuse hypokinesis and subendocardial scar in the basal inferior, basal posterior, and basal anterolateral segments.  Echo (2/13) with EF 45%, moderate LV dilation, mild LVH.  Echo (1/14) with EF 15%, diffuse hypokinesis, inferoposterior akinesis, mild MR, grade I diastolic dysfunction.  Echo (5/14) with EF 30-35%, mild LV dilation, akinesis of the basal inferior wall otherwise diffuse hypokinesis.  13. Cervical OA.  14. Polymyalgia rheumatica 15. OSA: Severe on sleep study 10/12.  On CPAP.  16. Left lower leg hematoma in setting of Effient use.  He developed a left gastrocnemius abscess and septic shock with prolonged hospitalization beginning in 7/13.   Family History:  Father died with MI at age 47. He was an alcoholic. Mother died with cancer at around 36.   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    Current Outpatient Prescriptions  Medication Sig Dispense Refill  . aspirin EC 81 MG tablet Take 81 mg by mouth daily.      . carvedilol (COREG) 25 MG tablet Take 1 tablet (25 mg total) by mouth 2 (two) times daily.  180 tablet  3  . lisinopril (PRINIVIL,ZESTRIL) 20 MG tablet Take 1 tablet (20 mg total) by mouth daily.  30 tablet  6  . metFORMIN (GLUCOPHAGE) 500 MG tablet Take 1 tablet (500 mg total) by mouth 2 (two) times daily with a meal. Start taking on 01/27/13  60 tablet  1  . Multiple  Vitamins-Minerals (CENTRUM SILVER ADULT 50+) TABS Take 1 tablet by mouth daily.      . rosuvastatin (CRESTOR) 40 MG tablet Take 1 tablet (40 mg total) by mouth daily.  90 tablet  3  . ticlopidine (TICLID) 250 MG tablet Take 1 tablet (250 mg total) by mouth 2 (two) times daily.  60 tablet  6  . spironolactone (ALDACTONE) 25 MG tablet 1/2 tablet (total 12.5mg ) daily  15 tablet  3   No current facility-administered medications for this visit.    BP 126/68  Pulse 77  Ht 5\' 10"  (1.778 m)  Wt 109.317 kg (241 lb)  BMI 34.58 kg/m2 General: NAD, obese.  Neck: Thick, JVP 7 cm, no thyromegaly or thyroid nodule.  Lungs: Clear to auscultation bilaterally with normal respiratory effort.  CV: Nondisplaced  PMI. Heart regular S1/S2, no S3/S4, no murmur. Trace left ankle edema. No carotid bruit. Normal pedal pulses.  Abdomen: Soft, nontender, no hepatosplenomegaly, no distention.  Neurologic: Alert and oriented x 3.  Psych: Normal affect.  Extremities: No clubbing or cyanosis.   Assessment/Plan:  CORONARY ATHEROSCLEROSIS NATIVE CORONARY ARTERY  Doing well symptomatically after PCI.  He was allergic to Plavix and had severe gastrocnemius bleed with Effient. He has tolerated ticlodipine so far with no significant changes in his CBC (has completed 3 months of close CBC followup).  Continue ASA 81 and Crestor.  He will need to continue ticlopidine at least a year. I will get a CBC today.  HYPERLIPIDEMIA  He has been taking his Crestor.  LDL is not ideal but acceptable for now.  He is having trouble with Crestor expense: I will try to help him out with samples.  He does not tolerate atorvastatin due to myalgias. Systolic CHF, chronic EF improved but still < 35% on echo in 5/14.  He is euvolemic on exam, NYHA class II symptoms.  - No indication for Lasix at this point.  - Continue Coreg and lisinopril. - Add spironolactone 12.5 mg daily with BMET/BNP in 2 wks (he says that he will take it this time) -  Repeat echo in 12/14.  If EF still low at that point, I will again recommend that he get ICD.  Diabetes Poor control. Last few blood glucose readings that I see have been in the 300s.  He eats lots of sweets (ice cream, cookies, etc).  He does not seem to be following any particular diet.  He says that he will not go to see a nutritionist.  He will try to cut back on the sweets.  I asked him to get a followup with Dr. Caryl Never to deal with diabetes.  I will check hemoglobin A1c.    Marca Ancona 08/24/2013

## 2013-09-02 ENCOUNTER — Other Ambulatory Visit (INDEPENDENT_AMBULATORY_CARE_PROVIDER_SITE_OTHER): Payer: Medicare Other

## 2013-09-02 DIAGNOSIS — E785 Hyperlipidemia, unspecified: Secondary | ICD-10-CM

## 2013-09-02 DIAGNOSIS — R0602 Shortness of breath: Secondary | ICD-10-CM

## 2013-09-02 DIAGNOSIS — E119 Type 2 diabetes mellitus without complications: Secondary | ICD-10-CM

## 2013-09-02 DIAGNOSIS — I5022 Chronic systolic (congestive) heart failure: Secondary | ICD-10-CM | POA: Diagnosis not present

## 2013-09-02 DIAGNOSIS — I428 Other cardiomyopathies: Secondary | ICD-10-CM | POA: Diagnosis not present

## 2013-09-02 DIAGNOSIS — I509 Heart failure, unspecified: Secondary | ICD-10-CM

## 2013-09-02 DIAGNOSIS — L259 Unspecified contact dermatitis, unspecified cause: Secondary | ICD-10-CM | POA: Diagnosis not present

## 2013-09-02 LAB — HEMOGLOBIN A1C: Hgb A1c MFr Bld: 11.7 % — ABNORMAL HIGH (ref 4.6–6.5)

## 2013-09-02 LAB — CBC WITH DIFFERENTIAL/PLATELET
Eosinophils Relative: 7.1 % — ABNORMAL HIGH (ref 0.0–5.0)
HCT: 43.6 % (ref 39.0–52.0)
Monocytes Relative: 8.8 % (ref 3.0–12.0)
Neutrophils Relative %: 58.5 % (ref 43.0–77.0)
Platelets: 199 10*3/uL (ref 150.0–400.0)
RBC: 5.12 Mil/uL (ref 4.22–5.81)
WBC: 6.3 10*3/uL (ref 4.5–10.5)

## 2013-09-02 LAB — BASIC METABOLIC PANEL
Calcium: 9.3 mg/dL (ref 8.4–10.5)
Chloride: 105 mEq/L (ref 96–112)
Creatinine, Ser: 1 mg/dL (ref 0.4–1.5)
Sodium: 137 mEq/L (ref 135–145)

## 2013-09-02 LAB — LIPID PANEL
Cholesterol: 212 mg/dL — ABNORMAL HIGH (ref 0–200)
HDL: 36.7 mg/dL — ABNORMAL LOW (ref >39.00)
Total CHOL/HDL Ratio: 6
Triglycerides: 141 mg/dL (ref 0.0–149.0)
VLDL: 28.2 mg/dL (ref 0.0–40.0)

## 2013-09-06 ENCOUNTER — Ambulatory Visit: Payer: Medicare Other | Admitting: Cardiology

## 2013-09-06 ENCOUNTER — Other Ambulatory Visit: Payer: Self-pay | Admitting: *Deleted

## 2013-09-06 DIAGNOSIS — E785 Hyperlipidemia, unspecified: Secondary | ICD-10-CM

## 2013-10-25 ENCOUNTER — Other Ambulatory Visit (INDEPENDENT_AMBULATORY_CARE_PROVIDER_SITE_OTHER): Payer: Medicare Other

## 2013-10-25 DIAGNOSIS — E785 Hyperlipidemia, unspecified: Secondary | ICD-10-CM

## 2013-10-25 LAB — LIPID PANEL
Cholesterol: 152 mg/dL (ref 0–200)
HDL: 38.1 mg/dL — ABNORMAL LOW (ref >39.00)

## 2013-10-25 LAB — HEPATIC FUNCTION PANEL
ALT: 17 U/L (ref 0–53)
AST: 18 U/L (ref 0–37)
Bilirubin, Direct: 0.1 mg/dL (ref 0.0–0.3)
Total Bilirubin: 0.9 mg/dL (ref 0.3–1.2)
Total Protein: 6.7 g/dL (ref 6.0–8.3)

## 2013-10-26 NOTE — Progress Notes (Signed)
LM lab OK

## 2013-10-27 ENCOUNTER — Encounter: Payer: Self-pay | Admitting: Cardiology

## 2013-10-27 ENCOUNTER — Encounter: Payer: Self-pay | Admitting: *Deleted

## 2013-10-27 ENCOUNTER — Ambulatory Visit (INDEPENDENT_AMBULATORY_CARE_PROVIDER_SITE_OTHER): Payer: Medicare Other | Admitting: Cardiology

## 2013-10-27 VITALS — BP 136/84 | HR 81 | Ht 70.0 in | Wt 238.0 lb

## 2013-10-27 DIAGNOSIS — I2510 Atherosclerotic heart disease of native coronary artery without angina pectoris: Secondary | ICD-10-CM

## 2013-10-27 DIAGNOSIS — Z23 Encounter for immunization: Secondary | ICD-10-CM | POA: Diagnosis not present

## 2013-10-27 DIAGNOSIS — E785 Hyperlipidemia, unspecified: Secondary | ICD-10-CM

## 2013-10-27 DIAGNOSIS — I5022 Chronic systolic (congestive) heart failure: Secondary | ICD-10-CM | POA: Diagnosis not present

## 2013-10-27 DIAGNOSIS — I509 Heart failure, unspecified: Secondary | ICD-10-CM

## 2013-10-27 DIAGNOSIS — I428 Other cardiomyopathies: Secondary | ICD-10-CM

## 2013-10-27 DIAGNOSIS — Z0000 Encounter for general adult medical examination without abnormal findings: Secondary | ICD-10-CM

## 2013-10-27 DIAGNOSIS — R0602 Shortness of breath: Secondary | ICD-10-CM | POA: Diagnosis not present

## 2013-10-27 LAB — BASIC METABOLIC PANEL
BUN: 16 mg/dL (ref 6–23)
Calcium: 9.4 mg/dL (ref 8.4–10.5)
GFR: 79.44 mL/min (ref >60.00)
Glucose, Bld: 259 mg/dL — ABNORMAL HIGH (ref 70–99)

## 2013-10-27 LAB — CBC WITH DIFFERENTIAL/PLATELET
Basophils Absolute: 0 10*3/uL (ref 0.0–0.1)
Basophils Relative: 0.2 % (ref 0.0–3.0)
Eosinophils Absolute: 0.5 10*3/uL (ref 0.0–0.7)
Eosinophils Relative: 4.9 % (ref 0.0–5.0)
Lymphocytes Relative: 18.9 % (ref 12.0–46.0)
MCHC: 33 g/dL (ref 30.0–36.0)
Monocytes Relative: 7.3 % (ref 3.0–12.0)
Neutro Abs: 6.4 10*3/uL (ref 1.4–7.7)
Neutrophils Relative %: 68.7 % (ref 43.0–77.0)
Platelets: 209 10*3/uL (ref 150.0–400.0)
RDW: 13.5 % (ref 11.5–14.6)

## 2013-10-27 LAB — BRAIN NATRIURETIC PEPTIDE: Pro B Natriuretic peptide (BNP): 106 pg/mL — ABNORMAL HIGH (ref 0.0–100.0)

## 2013-10-27 MED ORDER — SPIRONOLACTONE 25 MG PO TABS: 25.0000 mg | ORAL_TABLET | Freq: Every day | ORAL | Status: DC

## 2013-10-27 NOTE — Patient Instructions (Signed)
Increase spironolactone to 25mg  daily   Your physician recommends that you have  lab work today--BMET/BNP/CBCd  Your physician recommends that you return for lab work in: 10 days--BMET.  Your physician has requested that you have an echocardiogram. Echocardiography is a painless test that uses sound waves to create images of your heart. It provides your doctor with information about the size and shape of your heart and how well your heart's chambers and valves are working. This procedure takes approximately one hour. There are no restrictions for this procedure. December 2014  Your physician recommends that you schedule a follow-up appointment in: 3 months with Dr Shirlee Latch.

## 2013-10-27 NOTE — Progress Notes (Signed)
Patient ID: Wayne [name], male   DOB: 05-Apr-1941, 72 y.o.   MRN: [id number] PCP: Dr. Caryl Never  72 yo with history of DM, HTN, CAD, and ischemic cardiomyopathy presents for cardiology followup. Patient had a CHF exacerbation in 2/12 and was found to have LV systolic dysfunction with EF around 20%. LHC showed RCA, ramus, and CFX disease. RCA was subtotally occluded. Cardiac MRI showed that all walls, including the inferior wall, should be viable. Patient therefore underwent opening of his chronic totally occluded RCA as well as PCI to the ramus in 2/12. He received drug eluting stents and was on Effient.  He had an echo in 2/13 that showed EF improved to 45% with moderate LV dilation and mild LV hypertrophy.   In 5/13, he developed a large left lower leg hematoma.  He was still on Effient at that time.  ASA and Effient were stopped.  The hematoma did not resolve.  In 7/13, he was re-admitted with septic shock from MSSA from an abscess in his left gastrocnemius.  He also grew Pseudomonas from the left gastrocnemius as well.  He had a prolonged course in the hospital and later in a rehab facility.  Ultimately, he got back home again.  I had him get an echo in 1/14, and this showed EF 15% with diffuse hypokinesis and inferoposterior akinesis.  He had been off of most of his prior cardiac medications.  I took him back for [facility] in 1/14.  This showed subtotal occlusion of a small AV LCx and 95% ostial in-stent restenosis in the RCA.  He was treated in 2/14 with a Xience DES to the ostial RCA and begun on Plavix.  Unfortunately, he developed diffuse hives after starting Plavix and had to be switched to ticlopidine.  He has tolerated ticlopidine.   Echo done in 5/14 showed some improvement in LV function but EF was still low at 30-35%.  He did not want ICD at our last discussion.  Symptomatically, he is doing well.  No chest pain.  He is short of breath carrying a heavy load or walking up a steep hill but otherwise  denies dyspnea.  Weight is down 3 lbs.  He is trying to follow a better diet.  He still has not seen Dr. Caryl Never for diabetes management.  He has not been checking his blood glucose.      Labs (3/12): TSH normal, ESR normal, HDL 34, LDL 77, LFTs normal, K 4.3, creatinine 0.9  Labs (4/12): K 4, creatinine 0.9, BNP 234 Labs (6/12): K 3.8, creatinine 0.72 Labs (9/12): K 4.4, creatinine 0.9, BNP 145, digoxin 0.8, hgbA1c 7.8 Labs (10/12): K 4, creatinine 0.7 Labs (2/13): K 4.5, creatinine 0.9, LDL 116, HDL 42 Labs (8/13): K 3.6, creatinine 1.25 Labs (2/14): K 4.2, creatinine 1.04 Labs (4/14): LDL 96, HDL 38, CBC today with with normal total WBCs, normal neutrophil count, normal plts.  Labs (5/14): HCT 41.1, Plts 194 Labs (6/14): HCT 41.2, WBC 6.5, plts 162 Labs (8/14): K 4.1, creatinine 1.0, BNP 141 Labs (9/14): hemoglobin A1c 11.7 Labs (11/14): LDL 88, HDL 38  Past Medical History:  1. HYPERTENSION  2. HYPERLIPIDEMIA  3. ECZEMA  4. RHINITIS  5. HERPES ZOSTER OPHTHALMICUS  6. ADENOCARCINOMA, PROSTATE: Status post prostatectomy in 2009. Has had some incontinence since then.  7. Diabetes mellitus type II  8. Arthritis  9. Obesity  10. GERD: rare  11. CAD: Presented with exertional dyspnea, never had chest pain. LHC (2/12) with subtotalled proximal  RCA and left to right collaterals, 90% proximal moderate-sized ramus, 95% proximal relatively small CFX, 40-50% proximal LAD. Cardiac MRI (2/12) showed EF 21%, some mild scar in basal segments but all wall segments would be expected to be viable. DES x 5 (overlapping) to RCA, DES x 1 to RI 01/30/11 .  Bled into leg with Effient use (long, complicated course).  LHC (1/14): AV LCx small with subtotal occlusion, 95% ostial instent restenosis in RCA. PCI in 2/14 to ostial RCA with 3.5 x 15 Xience DES.  Plavix allergy (hives) so put on ticlopidine.  12. Ischemic CMP: Echo (2/12) with moderately dilated LV, EF about 20% with diffuse hypokinesis and  inferior akinesis, pseudonormal diastolic function, mild MR, severe LAE, mildly decreased RV systolic function. RHC (2/12) with mean RA 12, PA 40/25, mean PCWP 26, CI 2.1.  Echo (5/12) with EF 40% (appeared worse to my eye) with posterior HK, basal inferior AK, inferoseptal AK, basal anteroseptal AK, mild MR.  Cardiac MRI was repeated and showed EF 32% (improved from 21%) and mild LV dilation (was severely dilated before) with diffuse hypokinesis and subendocardial scar in the basal inferior, basal posterior, and basal anterolateral segments.  Echo (2/13) with EF 45%, moderate LV dilation, mild LVH.  Echo (1/14) with EF 15%, diffuse hypokinesis, inferoposterior akinesis, mild MR, grade I diastolic dysfunction.  Echo (5/14) with EF 30-35%, mild LV dilation, akinesis of the basal inferior wall otherwise diffuse hypokinesis.  13. Cervical OA.  14. Polymyalgia rheumatica 15. OSA: Severe on sleep study 10/12.  On CPAP.  16. Left lower leg hematoma in setting of Effient use.  He developed a left gastrocnemius abscess and septic shock with prolonged hospitalization beginning in 7/13.   Family History:  Father died with MI at age 55. He was an alcoholic. Mother died with cancer at around 61.   Social History:  Occupation: retired Naval architect  Married, lives in [place]  Past smoker, quit around 1990   ROS: all systems reviewed and negative except as per HPI.    Current Outpatient Prescriptions  Medication Sig Dispense Refill  . aspirin EC 81 MG tablet Take 81 mg by mouth daily.      . carvedilol (COREG) 25 MG tablet Take 1 tablet (25 mg total) by mouth 2 (two) times daily.  180 tablet  3  . lisinopril (PRINIVIL,ZESTRIL) 20 MG tablet Take 1 tablet (20 mg total) by mouth daily.  30 tablet  6  . metFORMIN (GLUCOPHAGE) 500 MG tablet Take 1 tablet (500 mg total) by mouth 2 (two) times daily with a meal. Start taking on 01/27/13  60 tablet  1  . Multiple Vitamins-Minerals (CENTRUM SILVER ADULT 50+) TABS  Take 1 tablet by mouth daily.      . rosuvastatin (CRESTOR) 40 MG tablet Take 1 tablet (40 mg total) by mouth daily.  90 tablet  3  . ticlopidine (TICLID) 250 MG tablet Take 1 tablet (250 mg total) by mouth 2 (two) times daily.  60 tablet  6  . spironolactone (ALDACTONE) 25 MG tablet Take 1 tablet (25 mg total) by mouth daily.  30 tablet  3   No current facility-administered medications for this visit.    BP 136/84  Pulse 81  Ht 5\' 10"  (1.778 m)  Wt 238 lb (107.956 kg)  BMI 34.15 kg/m2  SpO2 96% General: NAD, obese.  Neck: Thick, JVP 7 cm, no thyromegaly or thyroid nodule.  Lungs: Clear to auscultation bilaterally with normal respiratory effort.  CV:  Nondisplaced PMI. Heart regular S1/S2, no S3/S4, no murmur. Trace left ankle edema. No carotid bruit. Normal pedal pulses.  Abdomen: Soft, nontender, no hepatosplenomegaly, no distention.  Neurologic: Alert and oriented x 3.  Psych: Normal affect.  Extremities: No clubbing or cyanosis.   Assessment/Plan:  CORONARY ATHEROSCLEROSIS NATIVE CORONARY ARTERY  Doing well symptomatically after PCI.  He was allergic to Plavix and had severe gastrocnemius bleed with Effient. He has tolerated ticlodipine so far with no significant changes in his CBC (has completed 3 months of close CBC followup).  Continue ASA 81 and Crestor.  He will need to continue ticlopidine at least a year (to 2/14). I will get a CBC today.  HYPERLIPIDEMIA  He has been taking his Crestor.  LDL was improved when recently checked.  He does not tolerate atorvastatin due to myalgias. Systolic CHF, chronic EF improved but still < 35% on echo in 5/14.  He is euvolemic on exam, NYHA class II symptoms.  - No indication for Lasix at this point.  - Continue Coreg and lisinopril. - Increase spironolactone to 25 mg daily with BMET today and in 10 days.  - Repeat echo in 12/14.  If EF still low at that point, I will again recommend that he get ICD.  Diabetes Poor control historically.   He has been trying to eat better and weight is down 3 lbs.  He has not been checking his blood glucose.  I again asked him to get an appointment with Dr. Caryl Never for diabetes management.  He says that he will do this.     Marca Ancona 10/27/2013

## 2013-11-01 ENCOUNTER — Other Ambulatory Visit: Payer: Self-pay | Admitting: Cardiology

## 2013-11-02 ENCOUNTER — Other Ambulatory Visit: Payer: Self-pay | Admitting: *Deleted

## 2013-11-02 MED ORDER — ROSUVASTATIN CALCIUM 40 MG PO TABS: 40.0000 mg | ORAL_TABLET | Freq: Every day | ORAL | Status: DC

## 2013-11-07 ENCOUNTER — Other Ambulatory Visit (INDEPENDENT_AMBULATORY_CARE_PROVIDER_SITE_OTHER): Payer: Medicare Other

## 2013-11-07 DIAGNOSIS — I428 Other cardiomyopathies: Secondary | ICD-10-CM | POA: Diagnosis not present

## 2013-11-07 DIAGNOSIS — E785 Hyperlipidemia, unspecified: Secondary | ICD-10-CM | POA: Diagnosis not present

## 2013-11-07 DIAGNOSIS — I2510 Atherosclerotic heart disease of native coronary artery without angina pectoris: Secondary | ICD-10-CM

## 2013-11-07 DIAGNOSIS — I5022 Chronic systolic (congestive) heart failure: Secondary | ICD-10-CM | POA: Diagnosis not present

## 2013-11-07 DIAGNOSIS — R0602 Shortness of breath: Secondary | ICD-10-CM

## 2013-11-07 LAB — BASIC METABOLIC PANEL
BUN: 16 mg/dL (ref 6–23)
Calcium: 9.1 mg/dL (ref 8.4–10.5)
Chloride: 104 mEq/L (ref 96–112)
Creatinine, Ser: 1 mg/dL (ref 0.4–1.5)
GFR: 77.63 mL/min (ref >60.00)
Potassium: 4.3 mEq/L (ref 3.5–5.1)

## 2013-11-15 ENCOUNTER — Ambulatory Visit (HOSPITAL_COMMUNITY): Payer: Medicare Other | Attending: Cardiology | Admitting: Radiology

## 2013-11-15 DIAGNOSIS — E785 Hyperlipidemia, unspecified: Secondary | ICD-10-CM | POA: Diagnosis not present

## 2013-11-15 DIAGNOSIS — I5022 Chronic systolic (congestive) heart failure: Secondary | ICD-10-CM | POA: Diagnosis not present

## 2013-11-15 DIAGNOSIS — R079 Chest pain, unspecified: Secondary | ICD-10-CM | POA: Diagnosis not present

## 2013-11-15 DIAGNOSIS — I509 Heart failure, unspecified: Secondary | ICD-10-CM | POA: Insufficient documentation

## 2013-11-15 DIAGNOSIS — G4733 Obstructive sleep apnea (adult) (pediatric): Secondary | ICD-10-CM | POA: Insufficient documentation

## 2013-11-15 DIAGNOSIS — I10 Essential (primary) hypertension: Secondary | ICD-10-CM | POA: Insufficient documentation

## 2013-11-15 DIAGNOSIS — I428 Other cardiomyopathies: Secondary | ICD-10-CM | POA: Diagnosis not present

## 2013-11-15 DIAGNOSIS — Z87891 Personal history of nicotine dependence: Secondary | ICD-10-CM | POA: Diagnosis not present

## 2013-11-15 DIAGNOSIS — R0602 Shortness of breath: Secondary | ICD-10-CM

## 2013-11-15 DIAGNOSIS — I2510 Atherosclerotic heart disease of native coronary artery without angina pectoris: Secondary | ICD-10-CM

## 2013-11-15 NOTE — Progress Notes (Signed)
Echocardiogram performed.  

## 2013-11-16 ENCOUNTER — Encounter: Payer: Self-pay | Admitting: Family Medicine

## 2013-11-16 ENCOUNTER — Ambulatory Visit (INDEPENDENT_AMBULATORY_CARE_PROVIDER_SITE_OTHER): Payer: Medicare Other | Admitting: Family Medicine

## 2013-11-16 VITALS — BP 120/68 | Temp 98.1°F | Wt 243.0 lb

## 2013-11-16 DIAGNOSIS — J069 Acute upper respiratory infection, unspecified: Secondary | ICD-10-CM

## 2013-11-16 MED ORDER — HYDROCODONE-HOMATROPINE 5-1.5 MG/5ML PO SYRP: 5.0000 mL | ORAL_SOLUTION | Freq: Three times a day (TID) | ORAL | Status: DC | PRN

## 2013-11-16 NOTE — Progress Notes (Signed)
Pre visit review using our clinic review tool, if applicable. No additional management support is needed unless otherwise documented below in the visit note. 

## 2013-11-16 NOTE — Progress Notes (Signed)
Chief Complaint  Patient presents with  . Sinusitis    HPI:  -started: several days ago -symptoms:nasal congestion, scratchy and sore throat, drainage, cough, sinus pressure but  no sinus pain -denies:fever, SOB, NVD, tooth pain -has tried: musinex which helps -sick contacts/travel/risks: denies flu exposure, tick exposure or or Ebola risks  ROS: See pertinent positives and negatives per HPI.  Past Medical History  Diagnosis Date  . Diabetes mellitus, type 2   . HYPERLIPIDEMIA     intolerant to Lipitor (myalgias)  . HYPERTENSION   . CORONARY ATHEROSCLEROSIS NATIVE CORONARY ARTERY     a. 01/2011 Cath/PCI: LM nl, LAD 40-50p, D1 80-small, LCX 95-small, RI 90, RCA 100, EF 20%;  b. 01/2011 Card MRI - No transmural scar;  c. 01/2011 PCI RCA->5 Promus DES, RI->3.0x16 Promus DES; d. Cath 01/13/13 patent LAD & Ramus, diffuse LCx dz, RCA mult overlapping stents w/ 95% osital stenosis, EF 20%, s/p DES to ostial/prox RCA 01/24/13   . Herpes zoster ophthalmicus   . Arthritis   . Obesity   . GERD (gastroesophageal reflux disease)   . Ischemic cardiomyopathy     a. 01/2012 Echo EF 45%, mild LVH; b. EF15%, grade 1 diastolic dysfunction, diffuse hypokinesis, inferoposterior akinesis   . Adenocarcinoma of prostate     s/p seed implants  . Polymyalgia rheumatica   . Noncompliance   . Hematoma of leg     a. left leg hematoma 03/2012 in the setting of asa/effient  . Cellulitis of left leg     a. 05/2012 complicated by septic shock  . OSA (obstructive sleep apnea)   . Chronic combined systolic and diastolic CHF (congestive heart failure)     Past Surgical History  Procedure Laterality Date  . Prostate surgery      cancer, seed implant  . Coronary stent placement  2012    reports 6 stents placed  . I&d extremity  06/15/2012    Procedure: IRRIGATION AND DEBRIDEMENT EXTREMITY;  Surgeon: Nadara Mustard, MD;  Location: [facility] OR;  Service: Orthopedics;  Laterality: Left;  I&D Left Posterior Knee  . I&d  extremity  06/30/2012    Procedure: IRRIGATION AND DEBRIDEMENT EXTREMITY;  Surgeon: Nadara Mustard, MD;  Location: [facility] OR;  Service: Orthopedics;  Laterality: Left;  Left Leg Irrigation and Debridement and placement of Wound VAC and application of  A-cell  . I&d extremity  07/20/2012    Procedure: IRRIGATION AND DEBRIDEMENT EXTREMITY;  Surgeon: Nadara Mustard, MD;  Location: [facility] OR;  Service: Orthopedics;  Laterality: Left;  Irrigation and Debridement Left Leg and Place antibiotic beads   . Cardiac catheterization  01/24/2013  . Coronary angioplasty with stent placement  01/24/2013    DES to RCA    Family History  Problem Relation Age of Onset  . Cancer Mother 51    unknown CA  . Heart disease Father 20  . Alcohol abuse Father   . Heart attack Father     History   Social History  . Marital Status: Married    Spouse Name: N/A    Number of Children: Y  . Years of Education: N/A   Occupational History  . RETIRED     TRUCK DRIVER   Social History Main Topics  . Smoking status: Former Smoker -- 0.30 packs/day for 20 years    Types: Cigarettes    Quit date: 02/23/1989  . Smokeless tobacco: Never Used  . Alcohol Use: No  . Drug Use: No  . Sexual  Activity: None   Other Topics Concern  . None   Social History Narrative   Retired Naval architect    Current outpatient prescriptions:aspirin EC 81 MG tablet, Take 81 mg by mouth daily., Disp: , Rfl: ;  carvedilol (COREG) 25 MG tablet, Take 1 tablet (25 mg total) by mouth 2 (two) times daily., Disp: 180 tablet, Rfl: 3;  lisinopril (PRINIVIL,ZESTRIL) 20 MG tablet, Take 1 tablet (20 mg total) by mouth daily., Disp: 30 tablet, Rfl: 6 metFORMIN (GLUCOPHAGE) 500 MG tablet, Take 1 tablet (500 mg total) by mouth 2 (two) times daily with a meal. Start taking on 01/27/13, Disp: 60 tablet, Rfl: 1;  Multiple Vitamins-Minerals (CENTRUM SILVER ADULT 50+) TABS, Take 1 tablet by mouth daily., Disp: , Rfl: ;  rosuvastatin (CRESTOR) 40 MG tablet, Take 1 tablet  (40 mg total) by mouth daily., Disp: 30 tablet, Rfl: 2 spironolactone (ALDACTONE) 25 MG tablet, Take 1 tablet (25 mg total) by mouth daily., Disp: 30 tablet, Rfl: 3;  ticlopidine (TICLID) 250 MG tablet, Take 1 tablet (250 mg total) by mouth 2 (two) times daily., Disp: 60 tablet, Rfl: 6;  HYDROcodone-homatropine (HYCODAN) 5-1.5 MG/5ML syrup, Take 5 mLs by mouth every 8 (eight) hours as needed for cough., Disp: 120 mL, Rfl: 0  EXAM:  Filed Vitals:   11/16/13 1439  BP: 120/68  Temp: 98.1 F (36.7 C)    Body mass index is 34.87 kg/(m^2).  GENERAL: vitals reviewed and listed above, alert, oriented, appears well hydrated and in no acute distress  HEENT: atraumatic, conjunttiva clear, no obvious abnormalities on inspection of external nose and ears, normal appearance of ear canals and TMs, clear nasal congestion, mild post oropharyngeal erythema with PND, no tonsillar edema or exudate, no sinus TTP  NECK: no obvious masses on inspection  LUNGS: clear to auscultation bilaterally, no wheezes, rales or rhonchi, good air movement  CV: HRRR, no peripheral edema  MS: moves all extremities without noticeable abnormality  PSYCH: pleasant and cooperative, no obvious depression or anxiety  ASSESSMENT AND PLAN:  Discussed the following assessment and plan:  Acute upper respiratory infections of unspecified site - Plan: HYDROcodone-homatropine (HYCODAN) 5-1.5 MG/5ML syrup  -given HPI and exam findings today, a serious infection or illness is unlikely. We discussed potential etiologies, with VURI being most likely, and advised supportive care and monitoring. We discussed treatment side effects, likely course, antibiotic misuse, transmission, and signs of developing a serious illness. -of course, we advised to return or notify a doctor immediately if symptoms worsen or persist or new concerns arise.    Patient Instructions  INSTRUCTIONS FOR UPPER RESPIRATORY INFECTION:  -plenty of rest and  fluids  -nasal saline wash 2-3 times daily (use prepackaged nasal saline or bottled/distilled water if making your own)   -can use sinex nasal spray for drainage and nasal congestion - but do NOT use longer then 3-4 days  -can use tylenol or ibuprofen as directed for aches and sorethroat  -in the winter time, using a humidifier at night is helpful (please follow cleaning instructions)  -if you are taking a cough medication - use only as directed, may also try a teaspoon of honey to coat the throat and throat lozenges  -for sore throat, salt water gargles can help  -follow up if you have fevers, facial pain, tooth pain, difficulty breathing or are worsening or not getting better in 5-7 days      KIM, HANNAH R.

## 2013-11-16 NOTE — Patient Instructions (Signed)
INSTRUCTIONS FOR UPPER RESPIRATORY INFECTION:  -plenty of rest and fluids  -nasal saline wash 2-3 times daily (use prepackaged nasal saline or bottled/distilled water if making your own)   -can use sinex nasal spray for drainage and nasal congestion - but do NOT use longer then 3-4 days  -can use tylenol or ibuprofen as directed for aches and sorethroat  -in the winter time, using a humidifier at night is helpful (please follow cleaning instructions)  -if you are taking a cough medication - use only as directed, may also try a teaspoon of honey to coat the throat and throat lozenges  -for sore throat, salt water gargles can help  -follow up if you have fevers, facial pain, tooth pain, difficulty breathing or are worsening or not getting better in 5-7 days  

## 2014-01-25 ENCOUNTER — Ambulatory Visit: Payer: Medicare Other | Admitting: Cardiology

## 2014-01-25 ENCOUNTER — Ambulatory Visit (INDEPENDENT_AMBULATORY_CARE_PROVIDER_SITE_OTHER): Payer: Medicare Other | Admitting: Cardiology

## 2014-01-25 ENCOUNTER — Encounter: Payer: Self-pay | Admitting: Cardiology

## 2014-01-25 VITALS — BP 124/74 | HR 88 | Ht 70.0 in | Wt 240.0 lb

## 2014-01-25 DIAGNOSIS — I509 Heart failure, unspecified: Secondary | ICD-10-CM

## 2014-01-25 DIAGNOSIS — I5022 Chronic systolic (congestive) heart failure: Secondary | ICD-10-CM

## 2014-01-25 DIAGNOSIS — I428 Other cardiomyopathies: Secondary | ICD-10-CM | POA: Diagnosis not present

## 2014-01-25 DIAGNOSIS — E785 Hyperlipidemia, unspecified: Secondary | ICD-10-CM

## 2014-01-25 DIAGNOSIS — I2510 Atherosclerotic heart disease of native coronary artery without angina pectoris: Secondary | ICD-10-CM | POA: Diagnosis not present

## 2014-01-25 DIAGNOSIS — R0602 Shortness of breath: Secondary | ICD-10-CM

## 2014-01-25 LAB — BRAIN NATRIURETIC PEPTIDE: Pro B Natriuretic peptide (BNP): 85 pg/mL (ref 0.0–100.0)

## 2014-01-25 LAB — BASIC METABOLIC PANEL
BUN: 14 mg/dL (ref 6–23)
CO2: 26 meq/L (ref 19–32)
Calcium: 9.4 mg/dL (ref 8.4–10.5)
Chloride: 102 mEq/L (ref 96–112)
Creatinine, Ser: 1 mg/dL (ref 0.4–1.5)
GFR: 80.32 mL/min (ref >60.00)
GLUCOSE: 302 mg/dL — AB (ref 70–99)
POTASSIUM: 4.7 meq/L (ref 3.5–5.1)
SODIUM: 137 meq/L (ref 135–145)

## 2014-01-25 MED ORDER — LISINOPRIL 30 MG PO TABS: ORAL_TABLET | ORAL | Status: DC

## 2014-01-25 NOTE — Patient Instructions (Addendum)
Your physician has recommended you make the following change in your medication:   1. Stop Ticlid in March 2. Increase lisinopril to 30 mg daily  Your physician recommends that you have lab work today:BMET,BNP  Your physician recommends that you schedule a follow-up appointment in: 4 months with DR. Mclean

## 2014-01-26 NOTE — Progress Notes (Signed)
Patient ID: Wayne [name], male   DOB: 25-Dec-1940, 73 y.o.   MRN: [id number] PCP: Dr. Elease Hashimoto  73 yo with history of DM, HTN, CAD, and ischemic cardiomyopathy presents for cardiology followup. Patient had a CHF exacerbation in 2/12 and was found to have LV systolic dysfunction with EF around 20%. LHC showed RCA, ramus, and CFX disease. RCA was subtotally occluded. Cardiac MRI showed that all walls, including the inferior wall, should be viable. Patient therefore underwent opening of his chronic totally occluded RCA as well as PCI to the ramus in 2/12. He received drug eluting stents and was on Effient.  He had an echo in 2/13 that showed EF improved to 45% with moderate LV dilation and mild LV hypertrophy.   In 5/13, he developed a large left lower leg hematoma.  He was still on Effient at that time.  ASA and Effient were stopped.  The hematoma did not resolve.  In 7/13, he was re-admitted with septic shock from MSSA from an abscess in his left gastrocnemius.  He also grew Pseudomonas from the left gastrocnemius as well.  He had a prolonged course in the hospital and later in a rehab facility.  Ultimately, he got back home again.  I had him get an echo in 1/14, and this showed EF 15% with diffuse hypokinesis and inferoposterior akinesis.  He had been off of most of his prior cardiac medications.  I took him back for [facility] in 1/14.  This showed subtotal occlusion of a small AV LCx and 95% ostial in-stent restenosis in the RCA.  He was treated in 2/14 with a Xience DES to the ostial RCA and begun on Plavix.  Unfortunately, he developed diffuse hives after starting Plavix and had to be switched to ticlopidine.  He has tolerated ticlopidine.   Echo done in 12/14 showed some improvement in LV function but EF was still low at 30-35%.  He did not want ICD at our last discussion.  Symptomatically, he is doing well.  No chest pain.  He is short of breath carrying a heavy load or walking up a steep hill but otherwise  denies dyspnea.  Weight is up 2 lbs.  No orthopnea, PND, or tachypalpitations.    Labs (3/12): TSH normal, ESR normal, HDL 34, LDL 77, LFTs normal, K 4.3, creatinine 0.9  Labs (4/12): K 4, creatinine 0.9, BNP 234 Labs (6/12): K 3.8, creatinine 0.72 Labs (9/12): K 4.4, creatinine 0.9, BNP 145, digoxin 0.8, hgbA1c 7.8 Labs (10/12): K 4, creatinine 0.7 Labs (2/13): K 4.5, creatinine 0.9, LDL 116, HDL 42 Labs (8/13): K 3.6, creatinine 1.25 Labs (2/14): K 4.2, creatinine 1.04 Labs (4/14): LDL 96, HDL 38, CBC today with with normal total WBCs, normal neutrophil count, normal plts.  Labs (5/14): HCT 41.1, Plts 194 Labs (6/14): HCT 41.2, WBC 6.5, plts 162 Labs (8/14): K 4.1, creatinine 1.0, BNP 141 Labs (9/14): hemoglobin A1c 11.7 Labs (11/14): LDL 88, HDL 38  Past Medical History:  1. HYPERTENSION  2. HYPERLIPIDEMIA  3. ECZEMA  4. RHINITIS  5. HERPES ZOSTER OPHTHALMICUS  6. ADENOCARCINOMA, PROSTATE: Status post prostatectomy in 2009. Has had some incontinence since then.  7. Diabetes mellitus type II  8. Arthritis  9. Obesity  10. GERD: rare  11. CAD: Presented with exertional dyspnea, never had chest pain. LHC (2/12) with subtotalled proximal RCA and left to right collaterals, 90% proximal moderate-sized ramus, 95% proximal relatively small CFX, 40-50% proximal LAD. Cardiac MRI (2/12) showed EF 21%, some  mild scar in basal segments but all wall segments would be expected to be viable. DES x 5 (overlapping) to RCA, DES x 1 to RI 01/30/11 .  Bled into leg with Effient use (long, complicated course).  LHC (1/14): AV LCx small with subtotal occlusion, 95% ostial instent restenosis in RCA. PCI in 2/14 to ostial RCA with 3.5 x 15 Xience DES.  Plavix allergy (hives) so put on ticlopidine.  12. Ischemic CMP: Echo (2/12) with moderately dilated LV, EF about 20% with diffuse hypokinesis and inferior akinesis, pseudonormal diastolic function, mild MR, severe LAE, mildly decreased RV systolic function. RHC  (2/12) with mean RA 12, PA 40/25, mean PCWP 26, CI 2.1.  Echo (5/12) with EF 40% (appeared worse to my eye) with posterior HK, basal inferior AK, inferoseptal AK, basal anteroseptal AK, mild MR.  Cardiac MRI was repeated and showed EF 32% (improved from 21%) and mild LV dilation (was severely dilated before) with diffuse hypokinesis and subendocardial scar in the basal inferior, basal posterior, and basal anterolateral segments.  Echo (2/13) with EF 45%, moderate LV dilation, mild LVH.  Echo (1/14) with EF 15%, diffuse hypokinesis, inferoposterior akinesis, mild MR, grade I diastolic dysfunction.  Echo (5/14) with EF 30-35%, mild LV dilation, akinesis of the basal inferior wall otherwise diffuse hypokinesis.  Echo (12/14) with EF 30-35%, mild LV dilation, diffuse hypokinesis with basal inferior and posterior akinesis.  13. Cervical OA.  14. Polymyalgia rheumatica 15. OSA: Severe on sleep study 10/12.  On CPAP.  16. Left lower leg hematoma in setting of Effient use.  He developed a left gastrocnemius abscess and septic shock with prolonged hospitalization beginning in 7/13.   Family History:  Father died with MI at age 38. He was an alcoholic. Mother died with cancer at around 4.   Social History:  Occupation: retired Administrator  Married, lives in [place]  Past smoker, quit around [place]: all systems reviewed and negative except as per HPI.    Current Outpatient Prescriptions  Medication Sig Dispense Refill  . aspirin EC 81 MG tablet Take 81 mg by mouth daily.      . carvedilol (COREG) 25 MG tablet Take 1 tablet (25 mg total) by mouth 2 (two) times daily.  180 tablet  3  . HYDROcodone-homatropine (HYCODAN) 5-1.5 MG/5ML syrup Take 5 mLs by mouth every 8 (eight) hours as needed for cough.  120 mL  0  . lisinopril (PRINIVIL,ZESTRIL) 30 MG tablet 1 tab po qd  30 tablet  6  . metFORMIN (GLUCOPHAGE) 500 MG tablet Take 1 tablet (500 mg total) by mouth 2 (two) times daily with a meal. Start  taking on 01/27/13  60 tablet  1  . Multiple Vitamins-Minerals (CENTRUM SILVER ADULT 50+) TABS Take 1 tablet by mouth daily.      . rosuvastatin (CRESTOR) 40 MG tablet Take 1 tablet (40 mg total) by mouth daily.  30 tablet  2  . spironolactone (ALDACTONE) 25 MG tablet Take 1 tablet (25 mg total) by mouth daily.  30 tablet  3   No current facility-administered medications for this visit.    BP 124/74  Pulse 88  Ht 5' 10" (1.778 m)  Wt 108.863 kg (240 lb)  BMI 34.44 kg/m2 General: NAD, obese.  Neck: Thick, JVP 7 cm, no thyromegaly or thyroid nodule.  Lungs: Clear to auscultation bilaterally with normal respiratory effort.  CV: Nondisplaced PMI. Heart regular S1/S2, no S3/S4, no murmur. Trace left ankle edema. No carotid  bruit. Normal pedal pulses.  Abdomen: Soft, nontender, no hepatosplenomegaly, no distention.  Neurologic: Alert and oriented x 3.  Psych: Normal affect.  Extremities: No clubbing or cyanosis.   Assessment/Plan:  CAD Doing well symptomatically after PCI.  He was allergic to Plavix and had severe gastrocnemius bleed with Effient. He has tolerated ticlodipine so far with no significant changes in his CBC (has completed 3 months of close CBC followup).  Continue ASA 81 and Crestor.  He can stop ticlopidine in 3/15 (would not want him to continue this agent long-term).  HYPERLIPIDEMIA  He has been taking his Crestor.  LDL was improved when recently checked.  He does not tolerate atorvastatin due to myalgias. Systolic CHF, chronic EF improved but still < 35% on echo in 12/14.  He is euvolemic on exam, NYHA class II symptoms.  - No indication for Lasix at this point.  - Continue Coreg and spironolactone. - Increase lisinopril to 30 daily with BMET/BNP in 2 wks.  - We discussed ICD again today and he remains reticent to take this step.  He understands why I am asking him to consider it.    Loralie Champagne 01/26/2014

## 2014-03-13 DIAGNOSIS — R972 Elevated prostate specific antigen [PSA]: Secondary | ICD-10-CM | POA: Diagnosis not present

## 2014-03-13 DIAGNOSIS — N3941 Urge incontinence: Secondary | ICD-10-CM | POA: Diagnosis not present

## 2014-03-13 DIAGNOSIS — R350 Frequency of micturition: Secondary | ICD-10-CM | POA: Diagnosis not present

## 2014-03-13 DIAGNOSIS — C61 Malignant neoplasm of prostate: Secondary | ICD-10-CM | POA: Diagnosis not present

## 2014-04-16 ENCOUNTER — Other Ambulatory Visit: Payer: Self-pay | Admitting: Cardiology

## 2014-04-24 DIAGNOSIS — N3941 Urge incontinence: Secondary | ICD-10-CM | POA: Diagnosis not present

## 2014-04-25 ENCOUNTER — Ambulatory Visit (INDEPENDENT_AMBULATORY_CARE_PROVIDER_SITE_OTHER): Payer: Medicare Other | Admitting: Family Medicine

## 2014-04-25 ENCOUNTER — Encounter: Payer: Self-pay | Admitting: Family Medicine

## 2014-04-25 ENCOUNTER — Telehealth: Payer: Self-pay | Admitting: Cardiology

## 2014-04-25 VITALS — BP 128/74 | HR 78 | Temp 98.2°F | Wt 234.0 lb

## 2014-04-25 DIAGNOSIS — I2510 Atherosclerotic heart disease of native coronary artery without angina pectoris: Secondary | ICD-10-CM | POA: Diagnosis not present

## 2014-04-25 DIAGNOSIS — R1310 Dysphagia, unspecified: Secondary | ICD-10-CM

## 2014-04-25 MED ORDER — PANTOPRAZOLE SODIUM 40 MG PO TBEC: 40.0000 mg | DELAYED_RELEASE_TABLET | Freq: Every day | ORAL | Status: DC

## 2014-04-25 NOTE — Telephone Encounter (Signed)
New message         Pt is having tightness in chest / it has been like this for a couple of days.

## 2014-04-25 NOTE — Patient Instructions (Signed)
Dysphagia Swallowing problems (dysphagia) occur when solids and liquids seem to stick in your throat on the way down to your stomach, or the food takes longer to get to the stomach. Other symptoms include regurgitating food, noises coming from the throat, chest discomfort with swallowing, and a feeling of fullness or the feeling of something being stuck in your throat when swallowing. When blockage in your throat is complete it may be associated with drooling. CAUSES  Problems with swallowing may occur because of problems with the muscles. The food cannot be propelled in the usual manner into your stomach. You may have ulcers, scar tissue, or inflammation in the tube down which food travels from your mouth to your stomach (esophagus), which blocks food from passing normally into the stomach. Causes of inflammation include:  Acid reflux from your stomach into your esophagus.  Infection.  Radiation treatment for cancer.  Medicines taken without enough fluids to wash them down into your stomach. You may have nerve problems that prevent signals from being sent to the muscles of your esophagus to contract and move your food down to your stomach. Globus pharyngeus is a relatively common problem in which there is a sense of an obstruction or difficulty in swallowing, without any physical abnormalities of the swallowing passages being found. This problem usually improves over time with reassurance and testing to rule out other causes. DIAGNOSIS Dysphagia can be diagnosed and its cause can be determined by tests in which you swallow a white substance that helps illuminate the inside of your throat (contrast medium) while X-rays are taken. Sometimes a flexible telescope that is inserted down your throat (endoscopy) to look at your esophagus and stomach is used. TREATMENT   If the dysphagia is caused by acid reflux or infection, medicines may be used.  If the dysphagia is caused by problems with your  swallowing muscles, swallowing therapy may be used to help you strengthen your swallowing muscles.  If the dysphagia is caused by a blockage or mass, procedures to remove the blockage may be done. HOME CARE INSTRUCTIONS  Try to eat soft food that is easier to swallow and check your weight on a daily basis to be sure that it is not decreasing.  Be sure to drink liquids when sitting upright (not lying down). SEEK MEDICAL CARE IF:  You are losing weight because you are unable to swallow.  You are coughing when you drink liquids (aspiration).  You are coughing up partially digested food. SEEK IMMEDIATE MEDICAL CARE IF:  You are unable to swallow your own saliva .  You are having shortness of breath or a fever, or both.  You have a hoarse voice along with difficulty swallowing. MAKE SURE YOU:  Understand these instructions.  Will watch your condition.  Will get help right away if you are not doing well or get worse. Document Released: 11/28/2000 Document Revised: 08/03/2013 Document Reviewed: 05/20/2013 [facility] Patient Information 2014 [place].  We will call you with GI referral.

## 2014-04-25 NOTE — Telephone Encounter (Signed)
Received call from patient he stated he has chest congestion with green drainage.Stated for the past 2 weeks he feels like food gets stuck after he eats.Stated this causes chest tightness.Advised he needs to see PCP.

## 2014-04-25 NOTE — Telephone Encounter (Signed)
Spoke to patient he stated he saw PCP this morning.Stated he was prescribed medication for chest congestion.Stated PCP will be scheduling appointment with GI.

## 2014-04-25 NOTE — Telephone Encounter (Signed)
Food getting stuck sounds like esophageal stricture, probably needs GI evaluation.

## 2014-04-25 NOTE — Progress Notes (Signed)
Subjective:    Patient ID: Wayne [name], male    DOB: 05-Feb-1941, 73 y.o.   MRN: [id number]  Cough Pertinent negatives include no chest pain.   Patient seen with progressive dysphagia over the past few weeks. He's had occasional dry cough as well. He has occasional GERD symptoms but is not treated for GERD. He especially had difficulty with swallowing things like meats at times. He's been able to finish kneels but has had slow transit going down. He is maintaining good appetite and denies any weight loss. No abdominal pain.  Patient's chronic problems include remote history of adenocarcinoma prostate, hyperlipidemia, hypertension, CAD, cardiomyopathy with systolic heart failure, obstructive sleep apnea.  Patient quit smoking many years ago.  Past Medical History  Diagnosis Date  . Diabetes mellitus, type 2   . HYPERLIPIDEMIA     intolerant to Lipitor (myalgias)  . HYPERTENSION   . CORONARY ATHEROSCLEROSIS NATIVE CORONARY ARTERY     a. 01/2011 Cath/PCI: LM nl, LAD 40-50p, D1 80-small, LCX 95-small, RI 90, RCA 100, EF 20%;  b. 01/2011 Card MRI - No transmural scar;  c. 01/2011 PCI RCA->5 Promus DES, RI->3.0x16 Promus DES; d. Cath 01/13/13 patent LAD & Ramus, diffuse LCx dz, RCA mult overlapping stents w/ 95% osital stenosis, EF 20%, s/p DES to ostial/prox RCA 01/24/13   . Herpes zoster ophthalmicus   . Arthritis   . Obesity   . GERD (gastroesophageal reflux disease)   . Ischemic cardiomyopathy     a. 01/2012 Echo EF 45%, mild LVH; b. NO67%, grade 1 diastolic dysfunction, diffuse hypokinesis, inferoposterior akinesis   . Adenocarcinoma of prostate     s/p seed implants  . Polymyalgia rheumatica   . Noncompliance   . Hematoma of leg     a. left leg hematoma 03/2012 in the setting of asa/effient  . Cellulitis of left leg     a. 05/7208 complicated by septic shock  . OSA (obstructive sleep apnea)   . Chronic combined systolic and diastolic CHF (congestive heart failure)    Past Surgical  History  Procedure Laterality Date  . Prostate surgery      cancer, seed implant  . Coronary stent placement  2012    reports 6 stents placed  . I&d extremity  06/15/2012    Procedure: IRRIGATION AND DEBRIDEMENT EXTREMITY;  Surgeon: Newt Minion, MD;  Location: [place];  Service: Orthopedics;  Laterality: Left;  I&D Left Posterior Knee  . I&d extremity  06/30/2012    Procedure: IRRIGATION AND DEBRIDEMENT EXTREMITY;  Surgeon: Newt Minion, MD;  Location: [place];  Service: Orthopedics;  Laterality: Left;  Left Leg Irrigation and Debridement and placement of Wound VAC and application of  A-cell  . I&d extremity  07/20/2012    Procedure: IRRIGATION AND DEBRIDEMENT EXTREMITY;  Surgeon: Newt Minion, MD;  Location: [place];  Service: Orthopedics;  Laterality: Left;  Irrigation and Debridement Left Leg and Place antibiotic beads   . Cardiac catheterization  01/24/2013  . Coronary angioplasty with stent placement  01/24/2013    DES to RCA    reports that he quit smoking about 25 years ago. His smoking use included Cigarettes. He has a 6 pack-year smoking history. He has never used smokeless tobacco. He reports that he does not drink alcohol or use illicit drugs. family history includes Alcohol abuse in his father; Cancer (age of onset: 71) in his mother; Heart attack in his father; Heart disease (age of onset: 52) in his  father. Allergies  Allergen Reactions  . Plavix [Clopidogrel]     Nose bleeds, swellings, whelps on legs & back, itching  . Lipitor [Atorvastatin] Other (See Comments)    REACTION: sore legs  . Ancef [Cefazolin] Rash      Review of Systems  Constitutional: Negative for appetite change and unexpected weight change.  HENT: Positive for trouble swallowing. Negative for voice change.   Respiratory: Positive for cough.   Cardiovascular: Negative for chest pain and leg swelling.  Gastrointestinal: Negative for nausea, vomiting, abdominal pain, diarrhea and constipation.         Objective:   Physical Exam  Constitutional: He appears well-developed and well-nourished.  HENT:  Mouth/Throat: Oropharynx is clear and moist.  Neck: Neck supple. No thyromegaly present.  Cardiovascular: Normal rate.   Pulmonary/Chest: Effort normal and breath sounds normal. No respiratory distress. He has no wheezes. He has no rales.  Abdominal: Soft. Bowel sounds are normal. He exhibits no distension and no mass. There is no tenderness. There is no rebound and no guarding.          Assessment & Plan:  Solid food dysphagia. Patient has not had any red flags such as appetite or weight changes but needs further evaluation. We've recommended GERD precautions. Start protonix 40 mg once daily. Set up GI referral

## 2014-04-25 NOTE — Progress Notes (Signed)
Pre visit review using our clinic review tool, if applicable. No additional management support is needed unless otherwise documented below in the visit note. 

## 2014-05-01 ENCOUNTER — Encounter: Payer: Self-pay | Admitting: Internal Medicine

## 2014-05-26 ENCOUNTER — Ambulatory Visit: Payer: Medicare Other | Admitting: Cardiology

## 2014-06-27 ENCOUNTER — Encounter: Payer: Self-pay | Admitting: Internal Medicine

## 2014-06-27 ENCOUNTER — Ambulatory Visit (INDEPENDENT_AMBULATORY_CARE_PROVIDER_SITE_OTHER): Payer: Medicare Other | Admitting: Internal Medicine

## 2014-06-27 VITALS — BP 100/58 | HR 88 | Ht 67.5 in | Wt 233.2 lb

## 2014-06-27 DIAGNOSIS — R1310 Dysphagia, unspecified: Secondary | ICD-10-CM | POA: Diagnosis not present

## 2014-06-27 DIAGNOSIS — Z86010 Personal history of colonic polyps: Secondary | ICD-10-CM | POA: Diagnosis not present

## 2014-06-27 DIAGNOSIS — K219 Gastro-esophageal reflux disease without esophagitis: Secondary | ICD-10-CM | POA: Diagnosis not present

## 2014-06-27 DIAGNOSIS — I2510 Atherosclerotic heart disease of native coronary artery without angina pectoris: Secondary | ICD-10-CM | POA: Diagnosis not present

## 2014-06-27 NOTE — Progress Notes (Signed)
HISTORY OF PRESENT ILLNESS:  Wayne [name] is a 73 y.o. male with multiple medical problems as listed below who presents today evaluation of dysphagia. He is accompanied by his wife. Patient reports developing intermittent solid food dysphagia earlier this year. He was seen by his PCP and placed on pantoprazole 40 mg daily for one month. This seemed to help significantly, except for cornbread. He has been off PPI for 3 weeks. Does have a history of intermittent problems with pyrosis. His weight has been stable. GI review of systems is otherwise negative. Chronic medical problems are stable. Does have a history of "colon polyps" for which he is undergone 3 colonoscopies with GI physicians at [facility]. He continues his surveillance with them as needed.  REVIEW OF SYSTEMS:  All non-GI ROS negative except for sinus and allergy, arthritis, hearing problems, skin rash, increased thirst, increased urination, urinary leakage  Past Medical History  Diagnosis Date  . Diabetes mellitus, type 2   . HYPERLIPIDEMIA     intolerant to Lipitor (myalgias)  . HYPERTENSION   . CORONARY ATHEROSCLEROSIS NATIVE CORONARY ARTERY     a. 01/2011 Cath/PCI: LM nl, LAD 40-50p, D1 80-small, LCX 95-small, RI 90, RCA 100, EF 20%;  b. 01/2011 Card MRI - No transmural scar;  c. 01/2011 PCI RCA->5 Promus DES, RI->3.0x16 Promus DES; d. Cath 01/13/13 patent LAD & Ramus, diffuse LCx dz, RCA mult overlapping stents w/ 95% osital stenosis, EF 20%, s/p DES to ostial/prox RCA 01/24/13   . Herpes zoster ophthalmicus   . Arthritis   . Obesity   . GERD (gastroesophageal reflux disease)   . Ischemic cardiomyopathy     a. 01/2012 Echo EF 45%, mild LVH; b. ER74%, grade 1 diastolic dysfunction, diffuse hypokinesis, inferoposterior akinesis   . Adenocarcinoma of prostate     s/p seed implants  . Polymyalgia rheumatica   . Noncompliance   . Hematoma of leg     a. left leg hematoma 03/2012 in the setting of asa/effient  . Cellulitis of left leg    a. 0/8144 complicated by septic shock  . OSA (obstructive sleep apnea)   . Chronic combined systolic and diastolic CHF (congestive heart failure)   . Colon polyps     Past Surgical History  Procedure Laterality Date  . Prostate surgery      cancer, seed implant  . Coronary stent placement  2012    reports 6 stents placed  . I&d extremity  06/15/2012    Procedure: IRRIGATION AND DEBRIDEMENT EXTREMITY;  Surgeon: Newt Minion, MD;  Location: [place];  Service: Orthopedics;  Laterality: Left;  I&D Left Posterior Knee  . I&d extremity  06/30/2012    Procedure: IRRIGATION AND DEBRIDEMENT EXTREMITY;  Surgeon: Newt Minion, MD;  Location: [place];  Service: Orthopedics;  Laterality: Left;  Left Leg Irrigation and Debridement and placement of Wound VAC and application of  A-cell  . I&d extremity  07/20/2012    Procedure: IRRIGATION AND DEBRIDEMENT EXTREMITY;  Surgeon: Newt Minion, MD;  Location: [place];  Service: Orthopedics;  Laterality: Left;  Irrigation and Debridement Left Leg and Place antibiotic beads   . Cardiac catheterization  01/24/2013  . Coronary angioplasty with stent placement  01/24/2013    DES to [place]  reports that he quit smoking about 25 years ago. His smoking use included Cigarettes. He has a 6 pack-year smoking history. He has never used smokeless tobacco. He reports that he  does not drink alcohol or use illicit drugs.  family history includes Alcohol abuse in his father; Cancer (age of onset: 27) in his mother; Heart attack in his father; Heart disease (age of onset: 63) in his father.  Allergies  Allergen Reactions  . Plavix [Clopidogrel]     Nose bleeds, swellings, whelps on legs & back, itching  . Lipitor [Atorvastatin] Other (See Comments)    REACTION: sore legs  . Ancef [Cefazolin] Rash       PHYSICAL EXAMINATION: Vital signs: BP 100/58  Pulse 88  Ht 5' 7.5" (1.715 m)  Wt 233 lb 4 oz (105.802 kg)  BMI 35.97 kg/m2 General:  Well-developed, obese, well-nourished, no acute distress HEENT: Sclerae are anicteric, conjunctiva pink. Oral mucosa intact Lungs: Clear Heart: Regular Abdomen: soft, obese, nontender, nondistended, no obvious ascites, no peritoneal signs, normal bowel sounds. No organomegaly. Extremities: No edema Psychiatric: alert and oriented x3. Cooperative   ASSESSMENT:  #1. Intermittent solid food dysphagia. Improvement on PPI. Suspect peptic stricture #2. GERD. Improvement on PPI #3. History of "colon polyps". Followed by [facility] #4. General medical problems. Stable   PLAN:  #1. Upper endoscopy with probable esophageal dilation.The nature of the procedure, as well as the risks, benefits, and alternatives were carefully and thoroughly reviewed with the patient. Ample time for discussion and questions allowed. The patient understood, was satisfied, and agreed to proceed. #2. Anticipate prescribing chronic PPI therapy #3. Ongoing general medical care with PCP

## 2014-06-27 NOTE — Patient Instructions (Signed)

## 2014-06-28 ENCOUNTER — Telehealth: Payer: Self-pay

## 2014-06-28 ENCOUNTER — Encounter: Payer: Medicare Other | Admitting: Internal Medicine

## 2014-06-28 MED ORDER — OMEPRAZOLE 40 MG PO CPDR: 40.0000 mg | DELAYED_RELEASE_CAPSULE | Freq: Every day | ORAL | Status: DC

## 2014-06-28 NOTE — Telephone Encounter (Signed)
Message copied by Algernon Huxley on Wed Jun 28, 2014  3:53 PM ------      Message from: Irene Shipper      Created: Wed Jun 28, 2014  3:31 PM      Regarding: RE: EGD with dil       No, we will find him a spot before that. I will need to look at my schedule. In the meantime, I would like him to be on omeprazole 40 mg daily. Please prescribed and let him know. Also, check with his scheduled to find out if there any times when he cannot do the procedure. Thanks      ----- Message -----         From: Maury Dus, RN         Sent: 06/28/2014   3:27 PM           To: Irene Shipper, MD      Subject: EGD with dil                                             Dr. Henrene Pastor,            This pt has to be scheduled at the hospital. Do you just want him added to your hospital week?            Thanks,      Vaughan Basta       ------

## 2014-06-28 NOTE — Telephone Encounter (Signed)
Pt states that he can do most any day. Script sent in for Omeprazole and pt is aware.

## 2014-07-03 DIAGNOSIS — D235 Other benign neoplasm of skin of trunk: Secondary | ICD-10-CM | POA: Diagnosis not present

## 2014-07-03 DIAGNOSIS — L259 Unspecified contact dermatitis, unspecified cause: Secondary | ICD-10-CM | POA: Diagnosis not present

## 2014-07-03 DIAGNOSIS — L723 Sebaceous cyst: Secondary | ICD-10-CM | POA: Diagnosis not present

## 2014-07-03 DIAGNOSIS — L570 Actinic keratosis: Secondary | ICD-10-CM | POA: Diagnosis not present

## 2014-07-10 ENCOUNTER — Other Ambulatory Visit: Payer: Self-pay

## 2014-07-10 ENCOUNTER — Telehealth: Payer: Self-pay

## 2014-07-10 DIAGNOSIS — R1319 Other dysphagia: Secondary | ICD-10-CM

## 2014-07-10 NOTE — Telephone Encounter (Signed)
Left message for pt to call back. Pt scheduled for BS at [facility] 07/13/14. Pt to arrive at hospital at 10:45am for an 11am appt. Pt to be NPO after 8am. Pt aware of appt.

## 2014-07-10 NOTE — Telephone Encounter (Signed)
Spoke with pt and he states that he is doing fine and not having any issues with swallowing since starting the PPI. States he is feeling great. Dr. Henrene Pastor aware.

## 2014-07-10 NOTE — Telephone Encounter (Signed)
Will scheduled pt appt later when Oct schedule open.

## 2014-07-10 NOTE — Telephone Encounter (Signed)
That is great. We can forgo upper endoscopy. However, I would set him up for a barium swallow with tablet just to define the anatomy. He should continue on PPI and followup with me in 3 months

## 2014-07-10 NOTE — Telephone Encounter (Signed)
Message copied by Algernon Huxley on Mon Jul 10, 2014 10:07 AM ------      Message from: Irene Shipper      Created: Fri Jul 07, 2014  3:55 PM      Regarding: FW: EGD with dil       Vaughan Basta,      Please see how Mr. Phimmasone is swallowing now that he has been on PPI. Thanks      Dr. Henrene Pastor      ----- Message -----         From: Maury Dus, RN         Sent: 06/28/2014   3:56 PM           To: Irene Shipper, MD      Subject: RE: EGD with dil                                         Pt states he can do most any date. Script sent in for omeprazole.            ----- Message -----         From: Irene Shipper, MD         Sent: 06/28/2014   3:31 PM           To: Maury Dus, RN      Subject: RE: EGD with dil                                         No, we will find him a spot before that. I will need to look at my schedule. In the meantime, I would like him to be on omeprazole 40 mg daily. Please prescribed and let him know. Also, check with his scheduled to find out if there any times when he cannot do the procedure. Thanks      ----- Message -----         From: Maury Dus, RN         Sent: 06/28/2014   3:27 PM           To: Irene Shipper, MD      Subject: EGD with dil                                             Dr. Henrene Pastor,            This pt has to be scheduled at the hospital. Do you just want him added to your hospital week?            Thanks,      Vaughan Basta                   ------

## 2014-07-13 ENCOUNTER — Ambulatory Visit (HOSPITAL_COMMUNITY)
Admission: RE | Admit: 2014-07-13 | Discharge: 2014-07-13 | Disposition: A | Discharge status: Home / Self Care | Payer: Medicare Other | Source: Ambulatory Visit | Attending: Internal Medicine | Admitting: Internal Medicine

## 2014-07-13 DIAGNOSIS — C61 Malignant neoplasm of prostate: Secondary | ICD-10-CM | POA: Insufficient documentation

## 2014-07-13 DIAGNOSIS — M47812 Spondylosis without myelopathy or radiculopathy, cervical region: Secondary | ICD-10-CM | POA: Diagnosis not present

## 2014-07-13 DIAGNOSIS — K228 Other specified diseases of esophagus: Secondary | ICD-10-CM | POA: Insufficient documentation

## 2014-07-13 DIAGNOSIS — R1319 Other dysphagia: Secondary | ICD-10-CM

## 2014-07-13 DIAGNOSIS — K2289 Other specified disease of esophagus: Secondary | ICD-10-CM | POA: Diagnosis not present

## 2014-07-13 DIAGNOSIS — R1310 Dysphagia, unspecified: Secondary | ICD-10-CM | POA: Diagnosis not present

## 2014-08-09 ENCOUNTER — Encounter: Payer: Self-pay | Admitting: Internal Medicine

## 2014-08-28 ENCOUNTER — Other Ambulatory Visit: Payer: Self-pay | Admitting: *Deleted

## 2014-08-28 MED ORDER — CARVEDILOL 25 MG PO TABS: ORAL_TABLET | ORAL | Status: DC

## 2014-09-12 ENCOUNTER — Encounter: Payer: Self-pay | Admitting: *Deleted

## 2014-09-15 ENCOUNTER — Encounter: Payer: Self-pay | Admitting: *Deleted

## 2014-09-15 ENCOUNTER — Ambulatory Visit (INDEPENDENT_AMBULATORY_CARE_PROVIDER_SITE_OTHER): Payer: Medicare Other | Admitting: Cardiology

## 2014-09-15 ENCOUNTER — Encounter: Payer: Self-pay | Admitting: Cardiology

## 2014-09-15 VITALS — BP 132/74 | HR 79 | Ht 67.5 in | Wt 234.8 lb

## 2014-09-15 DIAGNOSIS — E785 Hyperlipidemia, unspecified: Secondary | ICD-10-CM

## 2014-09-15 DIAGNOSIS — I5022 Chronic systolic (congestive) heart failure: Secondary | ICD-10-CM | POA: Diagnosis not present

## 2014-09-15 DIAGNOSIS — I2510 Atherosclerotic heart disease of native coronary artery without angina pectoris: Secondary | ICD-10-CM | POA: Diagnosis not present

## 2014-09-15 LAB — BASIC METABOLIC PANEL
BUN: 15 mg/dL (ref 6–23)
CHLORIDE: 104 meq/L (ref 96–112)
CO2: 21 meq/L (ref 19–32)
CREATININE: 1 mg/dL (ref 0.4–1.5)
Calcium: 9.3 mg/dL (ref 8.4–10.5)
GFR: 79.24 mL/min (ref >60.00)
GLUCOSE: 331 mg/dL — AB (ref 70–99)
POTASSIUM: 4.3 meq/L (ref 3.5–5.1)
Sodium: 136 mEq/L (ref 135–145)

## 2014-09-15 MED ORDER — ROSUVASTATIN CALCIUM 5 MG PO TABS: 5.0000 mg | ORAL_TABLET | Freq: Every day | ORAL | Status: DC

## 2014-09-15 MED ORDER — SPIRONOLACTONE 25 MG PO TABS: ORAL_TABLET | ORAL | Status: DC

## 2014-09-15 NOTE — Patient Instructions (Signed)
Start spironolactone 12.5mg  daily. This will be 1/2 of a 25mg  tablet daily.  Start crestor 5mg  daily.  Your physician recommends that you have  lab work today--BMET.  Your physician recommends that you return for lab work in: 2 weeks--BMET.  Your physician recommends that you return for a FASTING lipid profile/liver profile in 2 months.   Your physician recommends that you schedule a follow-up appointment in: 3 months with Dr Aundra Dubin in [facility].  Your physician has requested that you have an echocardiogram. Echocardiography is a painless test that uses sound waves to create images of your heart. It provides your doctor with information about the size and shape of your heart and how well your heart's chambers and valves are working. This procedure takes approximately one hour. There are no restrictions for this procedure. IN 3 months. You can have this done the same day you see Dr Aundra Dubin at the [facility].

## 2014-09-17 NOTE — Progress Notes (Signed)
Patient ID: Wayne [name], male   DOB: Feb 07, 1941, 73 y.o.   MRN: [id number] PCP: Dr. Elease Hashimoto  73 yo with history of DM, HTN, CAD, and ischemic cardiomyopathy presents for cardiology followup. Patient had a CHF exacerbation in 2/12 and was found to have LV systolic dysfunction with EF around 20%. LHC showed RCA, ramus, and CFX disease. RCA was subtotally occluded. Cardiac MRI showed that all walls, including the inferior wall, should be viable. Patient therefore underwent opening of his chronic totally occluded RCA as well as PCI to the ramus in 2/12. He received drug eluting stents and was on Effient.  He had an echo in 2/13 that showed EF improved to 45% with moderate LV dilation and mild LV hypertrophy.   In 5/13, he developed a large left lower leg hematoma.  He was still on Effient at that time.  ASA and Effient were stopped.  The hematoma did not resolve.  In 7/13, he was re-admitted with septic shock from MSSA from an abscess in his left gastrocnemius.  He also grew Pseudomonas from the left gastrocnemius as well.  He had a prolonged course in the hospital and later in a rehab facility.  Ultimately, he got back home again.  I had him get an echo in 1/14, and this showed EF 15% with diffuse hypokinesis and inferoposterior akinesis.  He had been off of most of his prior cardiac medications.  I took him back for [facility] in 1/14.  This showed subtotal occlusion of a small AV LCx and 95% ostial in-stent restenosis in the RCA.  He was treated in 2/14 with a Xience DES to the ostial RCA and begun on Plavix.  Unfortunately, he developed diffuse hives after starting Plavix and had to be switched to ticlopidine.  He has tolerated ticlopidine.   Echo done in 12/14 showed some improvement in LV function but EF was still low at 30-35%.  He did not want ICD.   Symptomatically, he is doing well.  No chest pain.  He is short of breath carrying a heavy load or walking up a steep hill but otherwise denies dyspnea.  Weight is  down 6 lbs.  No orthopnea, PND, or tachypalpitations.  He has stopped taking spironolactone and is not sure why.  He stopped Crestor 40 mg daily due to leg cramps on Crestor that improved when he stopped it.  He is off ticlopidine.   Labs (3/12): TSH normal, ESR normal, HDL 34, LDL 77, LFTs normal, K 4.3, creatinine 0.9  Labs (4/12): K 4, creatinine 0.9, BNP 234 Labs (6/12): K 3.8, creatinine 0.72 Labs (9/12): K 4.4, creatinine 0.9, BNP 145, digoxin 0.8, hgbA1c 7.8 Labs (10/12): K 4, creatinine 0.7 Labs (2/13): K 4.5, creatinine 0.9, LDL 116, HDL 42 Labs (8/13): K 3.6, creatinine 1.25 Labs (2/14): K 4.2, creatinine 1.04 Labs (4/14): LDL 96, HDL 38, CBC today with with normal total WBCs, normal neutrophil count, normal plts.  Labs (5/14): HCT 41.1, Plts 194 Labs (6/14): HCT 41.2, WBC 6.5, plts 162 Labs (8/14): K 4.1, creatinine 1.0, BNP 141 Labs (9/14): hemoglobin A1c 11.7 Labs (11/14): LDL 88, HDL 38 Labs (2/15): BNP 85, K 4.7, creatinine 1.0  Past Medical History:  1. HYPERTENSION  2. HYPERLIPIDEMIA: Myalgias with atorvastatin and Crestor 3. ECZEMA  4. RHINITIS  5. HERPES ZOSTER OPHTHALMICUS  6. ADENOCARCINOMA, PROSTATE: Status post prostatectomy in 2009. Has had some incontinence since then.  7. Diabetes mellitus type II  8. Arthritis  9. Obesity  10.  GERD: rare  11. CAD: Presented with exertional dyspnea, never had chest pain. LHC (2/12) with subtotalled proximal RCA and left to right collaterals, 90% proximal moderate-sized ramus, 95% proximal relatively small CFX, 40-50% proximal LAD. Cardiac MRI (2/12) showed EF 21%, some mild scar in basal segments but all wall segments would be expected to be viable. DES x 5 (overlapping) to RCA, DES x 1 to RI 01/30/11 .  Bled into leg with Effient use (long, complicated course).  LHC (1/14): AV LCx small with subtotal occlusion, 95% ostial instent restenosis in RCA. PCI in 2/14 to ostial RCA with 3.5 x 15 Xience DES.  Plavix allergy (hives) so  put on ticlopidine.  12. Ischemic CMP: Echo (2/12) with moderately dilated LV, EF about 20% with diffuse hypokinesis and inferior akinesis, pseudonormal diastolic function, mild MR, severe LAE, mildly decreased RV systolic function. RHC (2/12) with mean RA 12, PA 40/25, mean PCWP 26, CI 2.1.  Echo (5/12) with EF 40% (appeared worse to my eye) with posterior HK, basal inferior AK, inferoseptal AK, basal anteroseptal AK, mild MR.  Cardiac MRI was repeated and showed EF 32% (improved from 21%) and mild LV dilation (was severely dilated before) with diffuse hypokinesis and subendocardial scar in the basal inferior, basal posterior, and basal anterolateral segments.  Echo (2/13) with EF 45%, moderate LV dilation, mild LVH.  Echo (1/14) with EF 15%, diffuse hypokinesis, inferoposterior akinesis, mild MR, grade I diastolic dysfunction.  Echo (5/14) with EF 30-35%, mild LV dilation, akinesis of the basal inferior wall otherwise diffuse hypokinesis.  Echo (12/14) with EF 30-35%, mild LV dilation, diffuse hypokinesis with basal inferior and posterior akinesis.  13. Cervical OA.  14. Polymyalgia rheumatica 15. OSA: Severe on sleep study 10/12.  On CPAP.  16. Left lower leg hematoma in setting of Effient use.  He developed a left gastrocnemius abscess and septic shock with prolonged hospitalization beginning in 7/13.   Family History:  Father died with MI at age 27. He was an alcoholic. Mother died with cancer at around 7.   Social History:  Occupation: retired Administrator  Married, lives in [place]  Past smoker, quit around [place]: all systems reviewed and negative except as per HPI.    Current Outpatient Prescriptions  Medication Sig Dispense Refill  . aspirin EC 81 MG tablet Take 81 mg by mouth daily.      . carvedilol (COREG) 25 MG tablet TAKE ONE TABLET BY MOUTH TWICE DAILY  180 tablet  0  . Multiple Vitamins-Minerals (CENTRUM SILVER ADULT 50+) TABS Take 1 tablet by mouth daily.      Marland Kitchen  tolterodine (DETROL LA) 4 MG 24 hr capsule Take 4 mg by mouth daily.      Marland Kitchen lisinopril (PRINIVIL,ZESTRIL) 10 MG tablet Take 1 tablet (10 mg total) by mouth 2 (two) times daily.      . rosuvastatin (CRESTOR) 5 MG tablet Take 1 tablet (5 mg total) by mouth daily.  30 tablet  3  . spironolactone (ALDACTONE) 25 MG tablet 1/2 tablet (12.66m) daily  45 tablet  1   No current facility-administered medications for this visit.    BP 132/74  Pulse 79  Ht 5' 7.5" (1.715 m)  Wt 234 lb 12.8 oz (106.505 kg)  BMI 36.21 kg/m2 General: NAD, obese.  Neck: Thick, JVP 7 cm, no thyromegaly or thyroid nodule.  Lungs: Clear to auscultation bilaterally with normal respiratory effort.  CV: Nondisplaced PMI. Heart regular S1/S2, no S3/S4, no murmur.  No edema. No carotid bruit. Normal pedal pulses.  Abdomen: Soft, nontender, no hepatosplenomegaly, no distention.  Neurologic: Alert and oriented x 3.  Psych: Normal affect.  Extremities: No clubbing or cyanosis.   Assessment/Plan:  CAD Doing well symptomatically after PCI.  He was allergic to Plavix and had severe gastrocnemius bleed with Effient. He has completed his year-long course of ticlopidine.  Continue ASA 81. HYPERLIPIDEMIA  Myalgias now with Crestor and atorvastatin.  I talked to him about going on a PCSK-9 inhibitor but he wants to try a lower dose of Crestor.  I will put him on Crestor 5 mg daily but do not think that this will get him to his LDL goal. Lipids/LFTs in 2 months.   Systolic CHF, chronic EF improved but still < 35% on echo in 12/14.  He is euvolemic on exam, NYHA class II symptoms.  - No indication for Lasix at this point.  - Continue Coreg and lisinopril.  - Restart spironolactone 12.5 mg daily, BMET today and in 2 wks.  - We discussed ICD again today and he still remains reticent to take this step.  He understands why I am asking him to consider it.  - I will repeat echo in 12/15.    Loralie Champagne 09/17/2014

## 2014-09-18 ENCOUNTER — Telehealth: Payer: Self-pay

## 2014-09-18 NOTE — Telephone Encounter (Signed)
Can you please call patient to set up followup appointment: lab results. Glucose elevated. Thank you

## 2014-09-19 NOTE — Telephone Encounter (Signed)
Lm on vm to cb °

## 2014-09-20 NOTE — Telephone Encounter (Signed)
Pt will cb to sch °

## 2014-09-29 ENCOUNTER — Other Ambulatory Visit (INDEPENDENT_AMBULATORY_CARE_PROVIDER_SITE_OTHER): Payer: Medicare Other | Admitting: *Deleted

## 2014-09-29 DIAGNOSIS — E785 Hyperlipidemia, unspecified: Secondary | ICD-10-CM

## 2014-09-29 DIAGNOSIS — I5022 Chronic systolic (congestive) heart failure: Secondary | ICD-10-CM

## 2014-09-29 LAB — BASIC METABOLIC PANEL
BUN: 18 mg/dL (ref 6–23)
CALCIUM: 9.1 mg/dL (ref 8.4–10.5)
CO2: 22 meq/L (ref 19–32)
Chloride: 105 mEq/L (ref 96–112)
Creatinine, Ser: 1.1 mg/dL (ref 0.4–1.5)
GFR: 72.44 mL/min (ref >60.00)
Glucose, Bld: 325 mg/dL — ABNORMAL HIGH (ref 70–99)
Potassium: 4 mEq/L (ref 3.5–5.1)
Sodium: 135 mEq/L (ref 135–145)

## 2014-11-15 ENCOUNTER — Other Ambulatory Visit (INDEPENDENT_AMBULATORY_CARE_PROVIDER_SITE_OTHER): Payer: Medicare Other | Admitting: *Deleted

## 2014-11-15 DIAGNOSIS — I5022 Chronic systolic (congestive) heart failure: Secondary | ICD-10-CM

## 2014-11-15 DIAGNOSIS — E785 Hyperlipidemia, unspecified: Secondary | ICD-10-CM | POA: Diagnosis not present

## 2014-11-15 LAB — LIPID PANEL
Cholesterol: 180 mg/dL (ref 0–200)
HDL: 28.5 mg/dL — ABNORMAL LOW (ref >39.00)
NonHDL: 151.5
Total CHOL/HDL Ratio: 6
Triglycerides: 279 mg/dL — ABNORMAL HIGH (ref 0.0–149.0)
VLDL: 55.8 mg/dL — ABNORMAL HIGH (ref 0.0–40.0)

## 2014-11-15 LAB — HEPATIC FUNCTION PANEL
ALK PHOS: 76 U/L (ref 39–117)
ALT: 20 U/L (ref 0–53)
AST: 21 U/L (ref 0–37)
Albumin: 3.8 g/dL (ref 3.5–5.2)
BILIRUBIN DIRECT: 0.1 mg/dL (ref 0.0–0.3)
Total Bilirubin: 0.8 mg/dL (ref 0.2–1.2)
Total Protein: 6.4 g/dL (ref 6.0–8.3)

## 2014-11-16 LAB — LDL CHOLESTEROL, DIRECT: Direct LDL: 90.7 mg/dL

## 2014-11-20 ENCOUNTER — Telehealth: Payer: Self-pay | Admitting: *Deleted

## 2014-11-20 DIAGNOSIS — E785 Hyperlipidemia, unspecified: Secondary | ICD-10-CM

## 2014-11-20 DIAGNOSIS — I5022 Chronic systolic (congestive) heart failure: Secondary | ICD-10-CM

## 2014-11-20 MED ORDER — ROSUVASTATIN CALCIUM 10 MG PO TABS: 10.0000 mg | ORAL_TABLET | Freq: Every day | ORAL | Status: DC

## 2014-11-20 NOTE — Telephone Encounter (Signed)
Notes Recorded by Larey Dresser, MD on 11/16/2014 at 12:21 PM LDL better, would try to increase Crestor to 10 mg daily.

## 2014-11-23 ENCOUNTER — Encounter (HOSPITAL_COMMUNITY): Payer: Self-pay | Admitting: Cardiovascular Disease

## 2014-12-12 ENCOUNTER — Encounter (HOSPITAL_COMMUNITY): Payer: Self-pay

## 2014-12-12 ENCOUNTER — Ambulatory Visit (HOSPITAL_BASED_OUTPATIENT_CLINIC_OR_DEPARTMENT_OTHER)
Admission: RE | Admit: 2014-12-12 | Discharge: 2014-12-12 | Disposition: A | Discharge status: Home / Self Care | Payer: Medicare Other | Source: Ambulatory Visit | Attending: Cardiology | Admitting: Cardiology

## 2014-12-12 ENCOUNTER — Ambulatory Visit (HOSPITAL_COMMUNITY)
Admission: RE | Admit: 2014-12-12 | Discharge: 2014-12-12 | Disposition: A | Discharge status: Home / Self Care | Payer: Medicare Other | Source: Ambulatory Visit | Attending: Family Medicine | Admitting: Family Medicine

## 2014-12-12 VITALS — BP 104/65 | HR 78 | Resp 18 | Wt 237.5 lb

## 2014-12-12 DIAGNOSIS — Z7982 Long term (current) use of aspirin: Secondary | ICD-10-CM | POA: Diagnosis not present

## 2014-12-12 DIAGNOSIS — I255 Ischemic cardiomyopathy: Secondary | ICD-10-CM | POA: Diagnosis not present

## 2014-12-12 DIAGNOSIS — I10 Essential (primary) hypertension: Secondary | ICD-10-CM | POA: Insufficient documentation

## 2014-12-12 DIAGNOSIS — I517 Cardiomegaly: Secondary | ICD-10-CM | POA: Insufficient documentation

## 2014-12-12 DIAGNOSIS — E785 Hyperlipidemia, unspecified: Secondary | ICD-10-CM | POA: Diagnosis not present

## 2014-12-12 DIAGNOSIS — E119 Type 2 diabetes mellitus without complications: Secondary | ICD-10-CM | POA: Insufficient documentation

## 2014-12-12 DIAGNOSIS — Z8546 Personal history of malignant neoplasm of prostate: Secondary | ICD-10-CM | POA: Insufficient documentation

## 2014-12-12 DIAGNOSIS — I252 Old myocardial infarction: Secondary | ICD-10-CM | POA: Diagnosis not present

## 2014-12-12 DIAGNOSIS — Z79899 Other long term (current) drug therapy: Secondary | ICD-10-CM | POA: Diagnosis not present

## 2014-12-12 DIAGNOSIS — I2510 Atherosclerotic heart disease of native coronary artery without angina pectoris: Secondary | ICD-10-CM | POA: Diagnosis not present

## 2014-12-12 DIAGNOSIS — Z87891 Personal history of nicotine dependence: Secondary | ICD-10-CM | POA: Diagnosis not present

## 2014-12-12 DIAGNOSIS — I5022 Chronic systolic (congestive) heart failure: Secondary | ICD-10-CM

## 2014-12-12 DIAGNOSIS — Z9079 Acquired absence of other genital organ(s): Secondary | ICD-10-CM | POA: Insufficient documentation

## 2014-12-12 DIAGNOSIS — I6521 Occlusion and stenosis of right carotid artery: Secondary | ICD-10-CM | POA: Insufficient documentation

## 2014-12-12 DIAGNOSIS — R0600 Dyspnea, unspecified: Secondary | ICD-10-CM | POA: Insufficient documentation

## 2014-12-12 NOTE — Progress Notes (Signed)
  Echocardiogram 2D Echocardiogram has been performed.  Darlina Sicilian M 12/12/2014, 1:40 PM

## 2014-12-12 NOTE — Patient Instructions (Signed)
Make sure you are taking lisinopril TWICE daily.  Return for lab work in 2 weeks.  Will schedule you for fasting lipids in February.  Follow up 3 months.  Do the following things EVERYDAY: 1) Weigh yourself in the morning before breakfast. Write it down and keep it in a log. 2) Take your medicines as prescribed 3) Eat low salt foods-Limit salt (sodium) to 2000 mg per day.  4) Stay as active as you can everyday 5) Limit all fluids for the day to less than 2 liters

## 2014-12-13 NOTE — Progress Notes (Signed)
Patient ID: Wayne [name], male   DOB: Jun 07, 1941, 73 y.o.   MRN: [id number] PCP: Dr. Elease Hashimoto  73 yo with history of DM, HTN, CAD, and ischemic cardiomyopathy presents for cardiology followup. Patient had a CHF exacerbation in 2/12 and was found to have LV systolic dysfunction with EF around 20%. LHC showed RCA, ramus, and CFX disease. RCA was subtotally occluded. Cardiac MRI showed that all walls, including the inferior wall, should be viable. Patient therefore underwent opening of his chronic totally occluded RCA as well as PCI to the ramus in 2/12. He received drug eluting stents and was on Effient.  He had an echo in 2/13 that showed EF improved to 45% with moderate LV dilation and mild LV hypertrophy.   In 5/13, he developed a large left lower leg hematoma.  He was still on Effient at that time.  ASA and Effient were stopped.  The hematoma did not resolve.  In 7/13, he was re-admitted with septic shock from MSSA from an abscess in his left gastrocnemius.  He also grew Pseudomonas from the left gastrocnemius as well.  He had a prolonged course in the hospital and later in a rehab facility.  Ultimately, he got back home again.  I had him get an echo in 1/14, and this showed EF 15% with diffuse hypokinesis and inferoposterior akinesis.  He had been off of most of his prior cardiac medications.  I took him back for [facility] in 1/14.  This showed subtotal occlusion of a small AV LCx and 95% ostial in-stent restenosis in the RCA.  He was treated in 2/14 with a Xience DES to the ostial RCA and begun on Plavix.  Unfortunately, he developed diffuse hives after starting Plavix and had to be switched to ticlopidine.  He has tolerated ticlopidine.   Echo done in 12/14 showed some improvement in LV function but EF was still low at 30-35%.  He did not want ICD.   Symptomatically, he is doing well.  No chest pain.  He is short of breath carrying a heavy load or walking up a steep hill but otherwise denies dyspnea.  Weight is  up 3 lbs.  No orthopnea, PND, or tachypalpitations.  He has again stopped taking spironolactone and is not sure why.  He is only taking lisinopril 10 mg once daily rather than twice daily.  He is taking Crestor 10 mg daily and tolerating it without myalgias.  Echo was reviewed today.  EF remains about 35% with normal RV.   Labs (3/12): TSH normal, ESR normal, HDL 34, LDL 77, LFTs normal, K 4.3, creatinine 0.9  Labs (4/12): K 4, creatinine 0.9, BNP 234 Labs (6/12): K 3.8, creatinine 0.72 Labs (9/12): K 4.4, creatinine 0.9, BNP 145, digoxin 0.8, hgbA1c 7.8 Labs (10/12): K 4, creatinine 0.7 Labs (2/13): K 4.5, creatinine 0.9, LDL 116, HDL 42 Labs (8/13): K 3.6, creatinine 1.25 Labs (2/14): K 4.2, creatinine 1.04 Labs (4/14): LDL 96, HDL 38, CBC today with with normal total WBCs, normal neutrophil count, normal plts.  Labs (5/14): HCT 41.1, Plts 194 Labs (6/14): HCT 41.2, WBC 6.5, plts 162 Labs (8/14): K 4.1, creatinine 1.0, BNP 141 Labs (9/14): hemoglobin A1c 11.7 Labs (11/14): LDL 88, HDL 38 Labs (2/15): BNP 85, K 4.7, creatinine 1.0 Labs (10/15): K 4, creatinine 1.1 Labs (12/15): LDL 91, HDL 29  Past Medical History:  1. HYPERTENSION  2. HYPERLIPIDEMIA: Myalgias with atorvastatin and Crestor 3. ECZEMA  4. RHINITIS  5. HERPES ZOSTER OPHTHALMICUS  6. ADENOCARCINOMA, PROSTATE: Status post prostatectomy in 2009. Has had some incontinence since then.  7. Diabetes mellitus type II  8. Arthritis  9. Obesity  10. GERD: rare  11. CAD: Presented with exertional dyspnea, never had chest pain. LHC (2/12) with subtotalled proximal RCA and left to right collaterals, 90% proximal moderate-sized ramus, 95% proximal relatively small CFX, 40-50% proximal LAD. Cardiac MRI (2/12) showed EF 21%, some mild scar in basal segments but all wall segments would be expected to be viable. DES x 5 (overlapping) to RCA, DES x 1 to RI 01/30/11 .  Bled into leg with Effient use (long, complicated course).  LHC (1/14):  AV LCx small with subtotal occlusion, 95% ostial instent restenosis in RCA. PCI in 2/14 to ostial RCA with 3.5 x 15 Xience DES.  Plavix allergy (hives) so put on ticlopidine.  12. Ischemic CMP: Echo (2/12) with moderately dilated LV, EF about 20% with diffuse hypokinesis and inferior akinesis, pseudonormal diastolic function, mild MR, severe LAE, mildly decreased RV systolic function. RHC (2/12) with mean RA 12, PA 40/25, mean PCWP 26, CI 2.1.  Echo (5/12) with EF 40% (appeared worse to my eye) with posterior HK, basal inferior AK, inferoseptal AK, basal anteroseptal AK, mild MR.  Cardiac MRI was repeated and showed EF 32% (improved from 21%) and mild LV dilation (was severely dilated before) with diffuse hypokinesis and subendocardial scar in the basal inferior, basal posterior, and basal anterolateral segments.  Echo (2/13) with EF 45%, moderate LV dilation, mild LVH.  Echo (1/14) with EF 15%, diffuse hypokinesis, inferoposterior akinesis, mild MR, grade I diastolic dysfunction.  Echo (5/14) with EF 30-35%, mild LV dilation, akinesis of the basal inferior wall otherwise diffuse hypokinesis.  Echo (12/14) with EF 30-35%, mild LV dilation, diffuse hypokinesis with basal inferior and posterior akinesis. Echo (12/15) with EF 35%, mildly dilated LV, wall motion abnormalities, normal RV size and systolic function.  13. Cervical OA.  14. Polymyalgia rheumatica 15. OSA: Severe on sleep study 10/12.  On CPAP.  16. Left lower leg hematoma in setting of Effient use.  He developed a left gastrocnemius abscess and septic shock with prolonged hospitalization beginning in 7/13.   Family History:  Father died with MI at age 31. He was an alcoholic. Mother died with cancer at around 3.   Social History:  Occupation: retired Administrator  Married, lives in [place]  Past smoker, quit around [place]: all systems reviewed and negative except as per HPI.    Current Outpatient Prescriptions  Medication Sig  Dispense Refill  . aspirin EC 81 MG tablet Take 81 mg by mouth daily.    . carvedilol (COREG) 25 MG tablet TAKE ONE TABLET BY MOUTH TWICE DAILY 180 tablet 0  . lisinopril (PRINIVIL,ZESTRIL) 10 MG tablet Take 1 tablet (10 mg total) by mouth 2 (two) times daily.    . Multiple Vitamins-Minerals (CENTRUM SILVER ADULT 50+) TABS Take 1 tablet by mouth daily.    . rosuvastatin (CRESTOR) 10 MG tablet Take 1 tablet (10 mg total) by mouth daily. 90 tablet 0   No current facility-administered medications for this encounter.    BP 104/65 mmHg  Pulse 78  Resp 18  Wt 237 lb 8 oz (107.729 kg)  SpO2 98% General: NAD, obese.  Neck: Thick, JVP 7 cm, no thyromegaly or thyroid nodule.  Lungs: Clear to auscultation bilaterally with normal respiratory effort.  CV: Nondisplaced PMI. Heart regular S1/S2, no S3/S4, no murmur. No edema. No carotid  bruit. Normal pedal pulses.  Abdomen: Soft, nontender, no hepatosplenomegaly, no distention.  Neurologic: Alert and oriented x 3.  Psych: Normal affect.  Extremities: No clubbing or cyanosis.   Assessment/Plan:  CAD Doing well symptomatically after PCI.  He was allergic to Plavix and had severe gastrocnemius bleed with Effient.  He completed his year-long course of ticlopidine.  Continue ASA 81. HYPERLIPIDEMIA  Myalgias now with Crestor 40 mg daily and atorvastatin. He tolerated Crestor 5 mg daily and I recently increased it to 10 mg daily after last lipid check.  Goal LDL < 70.  Check lipids in 2/16.   Systolic CHF, chronic EF remains about 35%.  He is euvolemic on exam, NYHA class II symptoms.  - No indication for Lasix at this point.  - Continue Coreg.  - Increase lisinopril to 10 mg bid.   - At next appointment, will try again to get him to take spironolactone.  - BMET in 2 wks.   - We discussed ICD again today and he still remains reticent to take this step.  He understands why I am asking him to consider it.    Loralie Champagne 12/13/2014

## 2014-12-14 ENCOUNTER — Other Ambulatory Visit: Payer: Self-pay | Admitting: Cardiology

## 2014-12-16 ENCOUNTER — Other Ambulatory Visit: Payer: Self-pay | Admitting: Cardiology

## 2014-12-16 DIAGNOSIS — I5022 Chronic systolic (congestive) heart failure: Secondary | ICD-10-CM

## 2014-12-26 ENCOUNTER — Ambulatory Visit (HOSPITAL_COMMUNITY)
Admission: RE | Admit: 2014-12-26 | Discharge: 2014-12-26 | Disposition: A | Discharge status: Home / Self Care | Payer: Medicare Other | Source: Ambulatory Visit | Attending: Cardiology | Admitting: Cardiology

## 2014-12-26 DIAGNOSIS — I5022 Chronic systolic (congestive) heart failure: Secondary | ICD-10-CM | POA: Diagnosis present

## 2014-12-26 DIAGNOSIS — C61 Malignant neoplasm of prostate: Secondary | ICD-10-CM | POA: Diagnosis not present

## 2014-12-26 DIAGNOSIS — R350 Frequency of micturition: Secondary | ICD-10-CM | POA: Diagnosis not present

## 2014-12-26 LAB — BASIC METABOLIC PANEL
Anion gap: 11 (ref 5–15)
BUN: 19 mg/dL (ref 6–23)
CALCIUM: 9.4 mg/dL (ref 8.4–10.5)
CO2: 20 mmol/L (ref 19–32)
Chloride: 106 mEq/L (ref 96–112)
Creatinine, Ser: 1.23 mg/dL (ref 0.50–1.35)
GFR calc Af Amer: 66 mL/min — ABNORMAL LOW (ref >90)
GFR, EST NON AFRICAN AMERICAN: 57 mL/min — AB (ref >90)
Glucose, Bld: 352 mg/dL — ABNORMAL HIGH (ref 70–99)
Potassium: 5.1 mmol/L (ref 3.5–5.1)
SODIUM: 137 mmol/L (ref 135–145)

## 2015-01-16 ENCOUNTER — Other Ambulatory Visit: Payer: Self-pay | Admitting: Cardiology

## 2015-01-16 ENCOUNTER — Other Ambulatory Visit: Payer: Medicare Other

## 2015-01-23 ENCOUNTER — Other Ambulatory Visit (HOSPITAL_COMMUNITY): Payer: Medicare Other

## 2015-04-10 ENCOUNTER — Encounter: Payer: Self-pay | Admitting: Internal Medicine

## 2015-06-08 ENCOUNTER — Telehealth: Payer: Self-pay | Admitting: *Deleted

## 2015-06-08 DIAGNOSIS — R739 Hyperglycemia, unspecified: Secondary | ICD-10-CM

## 2015-06-08 NOTE — Telephone Encounter (Signed)
Left message on machine for patient to schedule a lab appointment a1c ordered.

## 2015-07-11 ENCOUNTER — Encounter: Payer: No Typology Code available for payment source | Admitting: Family Medicine

## 2015-10-15 DIAGNOSIS — E119 Type 2 diabetes mellitus without complications: Secondary | ICD-10-CM | POA: Diagnosis not present

## 2015-10-15 DIAGNOSIS — H2513 Age-related nuclear cataract, bilateral: Secondary | ICD-10-CM | POA: Diagnosis not present

## 2015-10-29 ENCOUNTER — Ambulatory Visit (INDEPENDENT_AMBULATORY_CARE_PROVIDER_SITE_OTHER): Payer: Medicare Other | Admitting: *Deleted

## 2015-10-29 DIAGNOSIS — Z23 Encounter for immunization: Secondary | ICD-10-CM | POA: Diagnosis not present

## 2015-11-14 ENCOUNTER — Encounter (HOSPITAL_COMMUNITY): Payer: Self-pay | Admitting: Cardiology

## 2015-11-14 ENCOUNTER — Emergency Department (HOSPITAL_COMMUNITY)
Admission: EM | Admit: 2015-11-14 | Discharge: 2015-11-14 | Disposition: A | Discharge status: Home / Self Care | Payer: Medicare Other | Attending: Emergency Medicine | Admitting: Emergency Medicine

## 2015-11-14 ENCOUNTER — Telehealth: Payer: Self-pay | Admitting: Family Medicine

## 2015-11-14 ENCOUNTER — Emergency Department (HOSPITAL_COMMUNITY): Payer: Medicare Other

## 2015-11-14 DIAGNOSIS — E119 Type 2 diabetes mellitus without complications: Secondary | ICD-10-CM | POA: Diagnosis not present

## 2015-11-14 DIAGNOSIS — Z87891 Personal history of nicotine dependence: Secondary | ICD-10-CM | POA: Insufficient documentation

## 2015-11-14 DIAGNOSIS — Z8546 Personal history of malignant neoplasm of prostate: Secondary | ICD-10-CM | POA: Insufficient documentation

## 2015-11-14 DIAGNOSIS — Z86010 Personal history of colonic polyps: Secondary | ICD-10-CM | POA: Diagnosis not present

## 2015-11-14 DIAGNOSIS — Z87828 Personal history of other (healed) physical injury and trauma: Secondary | ICD-10-CM | POA: Insufficient documentation

## 2015-11-14 DIAGNOSIS — I5023 Acute on chronic systolic (congestive) heart failure: Secondary | ICD-10-CM | POA: Diagnosis not present

## 2015-11-14 DIAGNOSIS — Z872 Personal history of diseases of the skin and subcutaneous tissue: Secondary | ICD-10-CM | POA: Insufficient documentation

## 2015-11-14 DIAGNOSIS — Z8719 Personal history of other diseases of the digestive system: Secondary | ICD-10-CM | POA: Insufficient documentation

## 2015-11-14 DIAGNOSIS — I10 Essential (primary) hypertension: Secondary | ICD-10-CM | POA: Insufficient documentation

## 2015-11-14 DIAGNOSIS — M1990 Unspecified osteoarthritis, unspecified site: Secondary | ICD-10-CM | POA: Insufficient documentation

## 2015-11-14 DIAGNOSIS — Z8619 Personal history of other infectious and parasitic diseases: Secondary | ICD-10-CM | POA: Diagnosis not present

## 2015-11-14 DIAGNOSIS — Z9114 Patient's other noncompliance with medication regimen: Secondary | ICD-10-CM

## 2015-11-14 DIAGNOSIS — Z8669 Personal history of other diseases of the nervous system and sense organs: Secondary | ICD-10-CM | POA: Diagnosis not present

## 2015-11-14 DIAGNOSIS — R0600 Dyspnea, unspecified: Secondary | ICD-10-CM

## 2015-11-14 DIAGNOSIS — I25811 Atherosclerosis of native coronary artery of transplanted heart without angina pectoris: Secondary | ICD-10-CM | POA: Diagnosis not present

## 2015-11-14 DIAGNOSIS — Z7982 Long term (current) use of aspirin: Secondary | ICD-10-CM | POA: Diagnosis not present

## 2015-11-14 DIAGNOSIS — E669 Obesity, unspecified: Secondary | ICD-10-CM | POA: Diagnosis not present

## 2015-11-14 DIAGNOSIS — I509 Heart failure, unspecified: Secondary | ICD-10-CM | POA: Diagnosis not present

## 2015-11-14 DIAGNOSIS — I5043 Acute on chronic combined systolic (congestive) and diastolic (congestive) heart failure: Secondary | ICD-10-CM | POA: Insufficient documentation

## 2015-11-14 DIAGNOSIS — R05 Cough: Secondary | ICD-10-CM | POA: Diagnosis not present

## 2015-11-14 DIAGNOSIS — I5021 Acute systolic (congestive) heart failure: Secondary | ICD-10-CM

## 2015-11-14 DIAGNOSIS — E785 Hyperlipidemia, unspecified: Secondary | ICD-10-CM | POA: Insufficient documentation

## 2015-11-14 DIAGNOSIS — Z9119 Patient's noncompliance with other medical treatment and regimen: Secondary | ICD-10-CM | POA: Diagnosis not present

## 2015-11-14 DIAGNOSIS — R0602 Shortness of breath: Secondary | ICD-10-CM | POA: Diagnosis present

## 2015-11-14 DIAGNOSIS — Z79899 Other long term (current) drug therapy: Secondary | ICD-10-CM | POA: Diagnosis not present

## 2015-11-14 LAB — CBC
HCT: 46.4 % (ref 39.0–52.0)
HEMOGLOBIN: 14.8 g/dL (ref 13.0–17.0)
MCH: 28.1 pg (ref 26.0–34.0)
MCHC: 31.9 g/dL (ref 30.0–36.0)
MCV: 88.2 fL (ref 78.0–100.0)
Platelets: 178 10*3/uL (ref 150–400)
RBC: 5.26 MIL/uL (ref 4.22–5.81)
RDW: 14.3 % (ref 11.5–15.5)
WBC: 6.7 10*3/uL (ref 4.0–10.5)

## 2015-11-14 LAB — BASIC METABOLIC PANEL
Anion gap: 8 (ref 5–15)
BUN: 16 mg/dL (ref 6–20)
CO2: 24 mmol/L (ref 22–32)
Calcium: 9.5 mg/dL (ref 8.9–10.3)
Chloride: 107 mmol/L (ref 101–111)
Creatinine, Ser: 1.11 mg/dL (ref 0.61–1.24)
GFR calc Af Amer: >60 mL/min (ref >60)
GLUCOSE: 252 mg/dL — AB (ref 65–99)
POTASSIUM: 5.2 mmol/L — AB (ref 3.5–5.1)
Sodium: 139 mmol/L (ref 135–145)

## 2015-11-14 LAB — I-STAT TROPONIN, ED: Troponin i, poc: 0.04 ng/mL (ref 0.00–0.08)

## 2015-11-14 MED ORDER — FUROSEMIDE 10 MG/ML IJ SOLN
40.0000 mg | Freq: Once | INTRAMUSCULAR | Status: AC
  Administered 2015-11-14: 40 mg via INTRAVENOUS
  Filled 2015-11-14: qty 4

## 2015-11-14 NOTE — ED Notes (Signed)
Pt reports SOB that started about a week ago but became worse today. Denies any chest pain. Does have cardiac hx, and SOb with worse with activity.

## 2015-11-14 NOTE — ED Provider Notes (Addendum)
CSN: [id number]     Arrival date & time 11/14/15  1204 History   First MD Initiated Contact with Patient 11/14/15 1247     Chief Complaint  Patient presents with  . Shortness of Breath     (Consider location/radiation/quality/duration/timing/severity/associated sxs/prior Treatment) HPI Comments: 74 year-old male with history of prostate cancer, bronchitis, current myopathy ejection fraction ranging 20% to 35%, follows with Dr. Aundra Dubin cardiology, sleep apnea, heart failure presents with worsening exertional shortness of breath for the past week. Patient had the point where he takes a couple steps and has severe shortness of breath similar to previous cardiac history. No chest pain. No recent surgeries or blood clot history. Past smoker. Chronic orthopnea. No current Lasix.  Patient is a 74 y.o. male presenting with shortness of breath. The history is provided by the patient.  Shortness of Breath Associated symptoms: no abdominal pain, no chest pain, no fever, no headaches, no neck pain, no rash and no vomiting     Past Medical History  Diagnosis Date  . Diabetes mellitus, type 2 ([place])   . HYPERLIPIDEMIA     intolerant to Lipitor (myalgias)  . HYPERTENSION   . CORONARY ATHEROSCLEROSIS NATIVE CORONARY ARTERY     a. 01/2011 Cath/PCI: LM nl, LAD 40-50p, D1 80-small, LCX 95-small, RI 90, RCA 100, EF 20%;  b. 01/2011 Card MRI - No transmural scar;  c. 01/2011 PCI RCA->5 Promus DES, RI->3.0x16 Promus DES; d. Cath 01/13/13 patent LAD & Ramus, diffuse LCx dz, RCA mult overlapping stents w/ 95% osital stenosis, EF 20%, s/p DES to ostial/prox RCA 01/24/13   . Herpes zoster ophthalmicus   . Arthritis   . Obesity   . GERD (gastroesophageal reflux disease)   . Ischemic cardiomyopathy     a. 01/2012 Echo EF 45%, mild LVH; b. [id number], grade 1 diastolic dysfunction, diffuse hypokinesis, inferoposterior akinesis   . Adenocarcinoma of prostate ([place])     s/p seed implants  . Polymyalgia rheumatica ([place])   .  Noncompliance   . Hematoma of leg     a. left leg hematoma 03/2012 in the setting of asa/effient  . Cellulitis of left leg     a. 0000000 complicated by septic shock  . OSA (obstructive sleep apnea)   . Chronic combined systolic and diastolic CHF (congestive heart failure) ([place])   . Colon polyps    Past Surgical History  Procedure Laterality Date  . Prostate surgery      cancer, seed implant  . Coronary stent placement  2012    reports 6 stents placed  . I&d extremity  06/15/2012    Procedure: IRRIGATION AND DEBRIDEMENT EXTREMITY;  Surgeon: Newt Minion, MD;  Location: [facility];  Service: Orthopedics;  Laterality: Left;  I&D Left Posterior Knee  . I&d extremity  06/30/2012    Procedure: IRRIGATION AND DEBRIDEMENT EXTREMITY;  Surgeon: Newt Minion, MD;  Location: [facility];  Service: Orthopedics;  Laterality: Left;  Left Leg Irrigation and Debridement and placement of Wound VAC and application of  A-cell  . I&d extremity  07/20/2012    Procedure: IRRIGATION AND DEBRIDEMENT EXTREMITY;  Surgeon: Newt Minion, MD;  Location: [facility];  Service: Orthopedics;  Laterality: Left;  Irrigation and Debridement Left Leg and Place antibiotic beads   . Cardiac catheterization  01/24/2013  . Coronary angioplasty with stent placement  01/24/2013    DES to RCA  . Percutaneous coronary stent intervention (pci-s) N/A 01/24/2013    Procedure: PERCUTANEOUS CORONARY STENT INTERVENTION (  PCI-S);  Surgeon: Wellington Hampshire, MD;  Location: [facility] CATH LAB;  Service: Cardiovascular;  Laterality: N/A;   Family History  Problem Relation Age of Onset  . Cancer Mother 56    unknown CA  . Heart disease Father   . Alcohol abuse Father   . Heart attack Father 28   Social History  Substance Use Topics  . Smoking status: Former Smoker -- 0.30 packs/day for 20 years    Types: Cigarettes    Quit date: 02/23/1989  . Smokeless tobacco: Never Used  . Alcohol Use: No    Review of Systems  Constitutional: Negative for fever and  chills.  HENT: Negative for congestion.   Eyes: Negative for visual disturbance.  Respiratory: Positive for shortness of breath.   Cardiovascular: Negative for chest pain.  Gastrointestinal: Negative for vomiting and abdominal pain.  Genitourinary: Negative for dysuria and flank pain.  Musculoskeletal: Negative for back pain, neck pain and neck stiffness.  Skin: Negative for rash.  Neurological: Negative for light-headedness and headaches.      Allergies  Plavix; Lipitor; and Ancef  Home Medications   Prior to Admission medications   Medication Sig Start Date End Date Taking? Authorizing Provider  aspirin EC 81 MG tablet Take 81 mg by mouth daily.   Yes Historical Provider, MD  Multiple Vitamins-Minerals (CENTRUM SILVER ADULT 50+) TABS Take 1 tablet by mouth daily.   Yes Historical Provider, MD  carvedilol (COREG) 25 MG tablet TAKE ONE TABLET BY MOUTH TWICE DAILY Patient not taking: Reported on 11/14/2015 12/14/14   Larey Dresser, MD  carvedilol (COREG) 25 MG tablet TAKE ONE TABLET BY MOUTH TWICE DAILY Patient not taking: Reported on 11/14/2015 12/18/14   Jolaine Artist, MD  lisinopril (PRINIVIL,ZESTRIL) 10 MG tablet Take 1 tablet (10 mg total) by mouth 2 (two) times daily. 09/15/14   Larey Dresser, MD  lisinopril (PRINIVIL,ZESTRIL) 30 MG tablet TAKE ONE TABLET BY MOUTH ONCE DAILY Patient not taking: Reported on 11/14/2015 01/18/15   Larey Dresser, MD  rosuvastatin (CRESTOR) 10 MG tablet Take 1 tablet (10 mg total) by mouth daily. Patient not taking: Reported on 11/14/2015 11/20/14   Larey Dresser, MD   BP 103/68 mmHg  Pulse 84  Temp(Src) 98 F (36.7 C) (Oral)  Resp 11  Ht 5\' 8"  (1.727 m)  Wt 232 lb 11.2 oz (105.552 kg)  BMI 35.39 kg/m2  SpO2 96% Physical Exam  Constitutional: He is oriented to person, place, and time. He appears well-developed and well-nourished.  HENT:  Head: Normocephalic and atraumatic.  Eyes: Conjunctivae are normal. Right eye exhibits no  discharge. Left eye exhibits no discharge.  Neck: Normal range of motion. Neck supple. No tracheal deviation present.  Cardiovascular: Normal rate and regular rhythm.   Pulmonary/Chest: Effort normal. He has rales. Tenderness: crackles bases bilateral no respiratory difficulty.  Abdominal: Soft. He exhibits no distension. There is no tenderness. There is no guarding.  Musculoskeletal: He exhibits no edema.  Neurological: He is alert and oriented to person, place, and time.  Skin: Skin is warm. No rash noted.  Psychiatric: He has a normal mood and affect.  Nursing note and vitals reviewed.   ED Course  Procedures (including critical care time) Labs Review Labs Reviewed  BASIC METABOLIC PANEL - Abnormal; Notable for the following:    Potassium 5.2 (*)    Glucose, Bld 252 (*)    All other components within normal limits  CBC  BRAIN NATRIURETIC PEPTIDE  I-STAT  TROPOININ, ED    Imaging Review Dg Chest 2 View  11/14/2015  CLINICAL DATA:  74 year old male shortness of breath with cough for 1 week. Initial encounter. EXAM: CHEST  2 VIEW COMPARISON:  07/21/2012 and earlier. FINDINGS: Increased basilar predominant interstitial opacity in both lungs. The most confluent opacity along the diaphragm most resembles atelectasis. No pneumothorax. Suggestion of small pleural effusions. Stable cardiomegaly and mediastinal contours. No other confluent pulmonary opacity. Visualized tracheal air column is within normal limits. IMPRESSION: Increased interstitial opacity with lung base atelectasis and probable small effusions. Favor interstitial edema. Viral/ atypical respiratory infection is the main differential consideration. Electronically Signed   By: Genevie Ann M.D.   On: 11/14/2015 13:31   I have personally reviewed and evaluated these images and lab results as part of my medical decision-making.   EKG Interpretation   Date/Time:  Wednesday November 14 2015 12:16:58 EST Ventricular Rate:  96 PR  Interval:  156 QRS Duration: 120 QT Interval:  368 QTC Calculation: 464 R Axis:   29 Text Interpretation:  Normal sinus rhythm Possible Left atrial enlargement  Anterior infarct , age undetermined Abnormal ECG Confirmed by Kaydan Wilhoite  MD,  Mahaley Schwering ([id number]) on 11/14/2015 1:11:15 PM      MDM   Final diagnoses:  Acute dyspnea  Acute systolic congestive heart failure ([place])   Patient with cardiac history presents with worsening exertional shortness of breath clinically most concerning for heart failure exacerbation. Chest x-ray consistent with mild edema. Lasix IV ordered discussed with cardiology will see the patient in the emergency room.  Patient had 2 L urine out with Lasix, not requiring oxygen, cardiology evaluated and will medically manage and follow closely outpatient. The patients results and plan were reviewed and discussed.   Any x-rays performed were independently reviewed by myself.   Differential diagnosis were considered with the presenting HPI.  Medications  furosemide (LASIX) injection 40 mg (40 mg Intravenous Given 11/14/15 1408)    Filed Vitals:   11/14/15 1400 11/14/15 1445 11/14/15 1530 11/14/15 1600  BP: 103/68 110/84 118/82 117/80  Pulse: 84 94 92 92  Temp:      TempSrc:      Resp: 11 13 16 16   Height:      Weight:      SpO2: 96% 95% 93% 94%    Final diagnoses:  Acute dyspnea  Acute systolic congestive heart failure (HCC)        Elnora Morrison, MD 11/14/15 [place]  Elnora Morrison, MD 11/14/15 1626

## 2015-11-14 NOTE — Discharge Instructions (Signed)
Follow up as directed by cardiology.  If you were given medicines take as directed.  If you are on coumadin or contraceptives realize their levels and effectiveness is altered by many different medicines.  If you have any reaction (rash, tongues swelling, other) to the medicines stop taking and see a physician.    If your blood pressure was elevated in the ER make sure you follow up for management with a primary doctor or return for chest pain, shortness of breath or stroke symptoms.  Please follow up as directed and return to the ER or see a physician for new or worsening symptoms.  Thank you. Filed Vitals:   11/14/15 1400 11/14/15 1445 11/14/15 1530 11/14/15 1600  BP: 103/68 110/84 118/82 117/80  Pulse: 84 94 92 92  Temp:      TempSrc:      Resp: 11 13 16 16   Height:      Weight:      SpO2: 96% 95% 93% 94%

## 2015-11-14 NOTE — Consult Note (Addendum)
Patient ID: KAYSHAWN SCHMUHL MRN: [id number], DOB/AGE: [birth year]   Admit date: 11/14/2015   Primary Physician: [name], MD Primary Cardiologist: Larey Dresser  Pt. Profile:  Wayne [name] is a 74 y.o. male with a history of DM, HTN, CAD (previously bleeding issue with Effient, diffuse hives to plavix, currently tolerated ticlopidine which he discontinued by him self)  ischemic cardiomyopathy, HL, former smoker (quit 1990) and GERD who came to [facility] ED for evaluation of worsening SOB.   HPI: As above. Last echo (12/12/14) with EF 35%, grade 1 DD,  mildly dilated LV, wall motion abnormalities, normal RV size and systolic function.   Cath 12/2012 showed LAD and large ramus patent. The AV LCx covered a small territory and is diffusely diseased. The RCA has multiple overlapping stents from the ostium to the distal vessel with damping upon engagement of the vessel. There is 95% ostial stenosis. The inferior wall is akinetic.The patient is now s/p successful PCA and DES placement to ostial and proximal RCA 01/24/2013.   Since last seen by Dr. Aundra Dubin 12/13/14 he was doing well up until last week despite not taking his medication since February 2016 and noncompliant with dietary regimen and salt intake. Currently only takes aspirin 81 mg and multivitamin daily. For the past week he has been having exertional shortness of breath that progressively worse. Now he can only walk a few steps and becomes short of breath. No exertional chest pain. He admits to having ongoing cold and cough as well. He stated that he coughed up whitish brown mucus and a few times in the last few days. One night he has a chill and cold as well. Mild intermittent lower extremity edema. The patient denies nausea, vomiting, chest pain, palpitations,  orthopnea, PND, dizziness, syncope, abdominal pain, hematochezia, melena, lower extremity edema. Denies recent travel.  K of 5.2. Poc troponin of 0.04. CXR Increased  interstitial opacity with lung base atelectasis and probable small effusions. Favor interstitial edema. Viral/ atypical respiratory infection is the main differential consideration. EKG non ischemic. Blood glucose of 252. Given IV lasix 40mg  x 1 in ED.   Problem List  Past Medical History  Diagnosis Date  . Diabetes mellitus, type 2 ([place])   . HYPERLIPIDEMIA     intolerant to Lipitor (myalgias)  . HYPERTENSION   . CORONARY ATHEROSCLEROSIS NATIVE CORONARY ARTERY     a. 01/2011 Cath/PCI: LM nl, LAD 40-50p, D1 80-small, LCX 95-small, RI 90, RCA 100, EF 20%;  b. 01/2011 Card MRI - No transmural scar;  c. 01/2011 PCI RCA->5 Promus DES, RI->3.0x16 Promus DES; d. Cath 01/13/13 patent LAD & Ramus, diffuse LCx dz, RCA mult overlapping stents w/ 95% osital stenosis, EF 20%, s/p DES to ostial/prox RCA 01/24/13   . Herpes zoster ophthalmicus   . Arthritis   . Obesity   . GERD (gastroesophageal reflux disease)   . Ischemic cardiomyopathy     a. 01/2012 Echo EF 45%, mild LVH; b. [id number], grade 1 diastolic dysfunction, diffuse hypokinesis, inferoposterior akinesis   . Adenocarcinoma of prostate ([place])     s/p seed implants  . Polymyalgia rheumatica ([place])   . Noncompliance   . Hematoma of leg     a. left leg hematoma 03/2012 in the setting of asa/effient  . Cellulitis of left leg     a. 0000000 complicated by septic shock  . OSA (obstructive sleep apnea)   . Chronic combined systolic and diastolic CHF (congestive heart failure) ([place])   .  Colon polyps     Past Surgical History  Procedure Laterality Date  . Prostate surgery      cancer, seed implant  . Coronary stent placement  2012    reports 6 stents placed  . I&d extremity  06/15/2012    Procedure: IRRIGATION AND DEBRIDEMENT EXTREMITY;  Surgeon: Newt Minion, MD;  Location: [place];  Service: Orthopedics;  Laterality: Left;  I&D Left Posterior Knee  . I&d extremity  06/30/2012    Procedure: IRRIGATION AND DEBRIDEMENT EXTREMITY;  Surgeon: Newt Minion, MD;   Location: [place];  Service: Orthopedics;  Laterality: Left;  Left Leg Irrigation and Debridement and placement of Wound VAC and application of  A-cell  . I&d extremity  07/20/2012    Procedure: IRRIGATION AND DEBRIDEMENT EXTREMITY;  Surgeon: Newt Minion, MD;  Location: [place];  Service: Orthopedics;  Laterality: Left;  Irrigation and Debridement Left Leg and Place antibiotic beads   . Cardiac catheterization  01/24/2013  . Coronary angioplasty with stent placement  01/24/2013    DES to RCA  . Percutaneous coronary stent intervention (pci-s) N/A 01/24/2013    Procedure: PERCUTANEOUS CORONARY STENT INTERVENTION (PCI-S);  Surgeon: Wellington Hampshire, MD;  Location: [facility] CATH LAB;  Service: Cardiovascular;  Laterality: N/A;     Allergies  Allergies  Allergen Reactions  . Plavix [Clopidogrel]     Nose bleeds, swellings, whelps on legs & back, itching  . Lipitor [Atorvastatin] Other (See Comments)    REACTION: sore legs  . Ancef [Cefazolin] Rash     Home Medications  Prior to Admission medications   Medication Sig Start Date End Date Taking? Authorizing Provider  aspirin EC 81 MG tablet Take 81 mg by mouth daily.    Historical Provider, MD  carvedilol (COREG) 25 MG tablet TAKE ONE TABLET BY MOUTH TWICE DAILY 12/14/14   Larey Dresser, MD  carvedilol (COREG) 25 MG tablet TAKE ONE TABLET BY MOUTH TWICE DAILY 12/18/14   Jolaine Artist, MD  lisinopril (PRINIVIL,ZESTRIL) 10 MG tablet Take 1 tablet (10 mg total) by mouth 2 (two) times daily. 09/15/14   Larey Dresser, MD  lisinopril (PRINIVIL,ZESTRIL) 30 MG tablet TAKE ONE TABLET BY MOUTH ONCE DAILY 01/18/15   Larey Dresser, MD  Multiple Vitamins-Minerals (CENTRUM SILVER ADULT 50+) TABS Take 1 tablet by mouth daily.    Historical Provider, MD  rosuvastatin (CRESTOR) 10 MG tablet Take 1 tablet (10 mg total) by mouth daily. 11/20/14   Larey Dresser, MD    Family History  Family History  Problem Relation Age of Onset  . Cancer Mother 57     unknown CA  . Heart disease Father   . Alcohol abuse Father   . Heart attack Father 36   Family Status  Relation Status Death Age  . Mother Deceased 41  . Father Deceased 68    MI   Social History  Social History   Social History  . Marital Status: Married    Spouse Name: N/A  . Number of Children: 1  . Years of Education: N/A   Occupational History  . RETIRED     TRUCK DRIVER   Social History Main Topics  . Smoking status: Former Smoker -- 0.30 packs/day for 20 years    Types: Cigarettes    Quit date: 02/23/1989  . Smokeless tobacco: Never Used  . Alcohol Use: No  . Drug Use: No  . Sexual Activity: Not on file   Other Topics Concern  .  Not on file   Social History Narrative   Retired Administrator      All other systems reviewed and are otherwise negative except as noted above.  Physical Exam  Blood pressure 103/68, pulse 84, temperature 98 F (36.7 C), temperature source Oral, resp. rate 11, height 5\' 8"  (1.727 m), weight 232 lb 11.2 oz (105.552 kg), SpO2 96 %.  General: Pleasant, NAD Psych: Normal affect. Neuro: Alert and oriented X 3. Moves all extremities spontaneously. HEENT: Normal  Neck: Supple without bruits or JVD. Lungs:  Resp regular and unlabored. Bibasilar crackles.  Heart: RRR no s3, s4, or murmurs. Abdomen: Soft, non-tender, non-distended, BS + x 4.  Extremities: No clubbing, cyanosis. Trace BL LE edema L>R. DP 1+ and equal bilaterally.  Labs  No results for input(s): CKTOTAL, CKMB, TROPONINI in the last 72 hours. Lab Results  Component Value Date   WBC 6.7 11/14/2015   HGB 14.8 11/14/2015   HCT 46.4 11/14/2015   MCV 88.2 11/14/2015   PLT 178 11/14/2015    Recent Labs Lab 11/14/15 1224  NA 139  K 5.2*  CL 107  CO2 24  BUN 16  CREATININE 1.11  CALCIUM 9.5  GLUCOSE 252*   Lab Results  Component Value Date   CHOL 180 11/15/2014   HDL 28.50* 11/15/2014   LDLCALC 88 10/25/2013   TRIG 279.0* 11/15/2014   No results found  for: DDIMER   Radiology/Studies  Dg Chest 2 View  11/14/2015  CLINICAL DATA:  74 year old male shortness of breath with cough for 1 week. Initial encounter. EXAM: CHEST  2 VIEW COMPARISON:  07/21/2012 and earlier. FINDINGS: Increased basilar predominant interstitial opacity in both lungs. The most confluent opacity along the diaphragm most resembles atelectasis. No pneumothorax. Suggestion of small pleural effusions. Stable cardiomegaly and mediastinal contours. No other confluent pulmonary opacity. Visualized tracheal air column is within normal limits. IMPRESSION: Increased interstitial opacity with lung base atelectasis and probable small effusions. Favor interstitial edema. Viral/ atypical respiratory infection is the main differential consideration. Electronically Signed   By: Genevie Ann M.D.   On: 11/14/2015 13:31   01/13/13 - Cardiac Cath  Hemodynamics:  AO 100/55  LV 121/20  Coronary angiography:  Coronary dominance: right  Left mainstem: 40% distal left main stenosis.  Left anterior descending (LAD): 30% proximal LAD stenosis. Luminal irregularities throughout.  Left circumflex (LCx): Large ramus with patent stent in the proximal portion of the vessel. The AV LCx is a small vessel. Subtotal occlusion, progressive from prior study.  Right coronary artery (RCA): The RCA is covered with overlapping stents from the ostium to the distal vessel. There was damping of the pressure waveform with engagement of the RCA. There was significant up to 95% ostial stenosis in the RCA, within stent. The remainder of the RCA had minimal disease. There were faint collaterals from the RCA to the territory of the AV LCx. TIMI 3 flow in RCA.  Left ventriculography: EF is around 20% (difficult with PVCs). The inferior wall is akinetic.  Final Conclusions: LAD and large ramus patent. The AV LCx covered a small territory and is diffusely diseased. The RCA has multiple overlapping stents from the ostium to  the distal vessel with damping upon engagement of the vessel. There is 95% ostial stenosis. The inferior wall is akinetic.  Recommendations: Discussed films with Dr. Burt Knack. Hopefully a short ostial DES would open the RCA. Given the significant fall in EF but good flow down RCA, I think that this is  warranted. I will start Plavix prior to procedure and we will check P2Y12 level prior to PCI to make sure he is not a Plavix hyper-responder. I think that Plavix will be unlikely to cause him the bleeding problems that he had with Effient. Discussed with wife and patient, PCI scheduled 2/10 with Dr. Burt Knack   01/24/13 - Cardiac Cath/PCI  Lesion Data:  Vessel: Ostial/Proximal RCA  Percent stenosis (pre): 95%  TIMI-flow (pre): 3  Stent: 3. 5 X 15 mm Xience DES  Percent stenosis (post): 0 %  TIMI-flow (post): 3  Conclusions: Successful PCA and DES placement to ostial and proximal RCA  Recommendations: Dual antiplatelet therapy for at least 1 year   Echo 12/12/2014 LV EF: 35%  ------------------------------------------------------------------- Indications:   CHF - 428.0.  ------------------------------------------------------------------- History:  PMH: NSTEMI. Dyspnea. Risk factors: Hypertension. Dyslipidemia.  ------------------------------------------------------------------- Study Conclusions  - Left ventricle: The cavity size was mildly dilated. Wall thickness was normal. The estimated ejection fraction was 35%. There was inferoseptal, anterolateral, inferolateral, and anterior hypokinesis. The basal inferior wall was akinetic. Doppler parameters are consistent with abnormal left ventricular relaxation (grade 1 diastolic dysfunction). - Aortic valve: Trileaflet; moderately calcified leaflets. There was no stenosis. - Mitral valve: Mildly calcified annulus. Mildly calcified leaflets . There was no significant regurgitation. - Left atrium: The atrium was  mildly dilated. - Right ventricle: The cavity size was normal. Systolic function was normal. - Pulmonary arteries: No complete TR doppler jet so unable to estimate PA systolic pressure. - Inferior vena cava: The vessel was normal in size. The respirophasic diameter changes were in the normal range (>= 50%), consistent with normal central venous pressure.  Impressions:  - Mildly dilated LV with EF 35%. Wall motion abnormalities as above. Normal RV size and systolic function. No significant valvuilar abnormalities.  ECG  Vent. rate 96 BPM PR interval 156 ms QRS duration 120 ms QT/QTc 368/464 ms P-R-T axes 59 29 50  ASSESSMENT AND PLAN  1. DOE - Patient has been not taking his medication for the past 10 months. He was doing well up until last week when he has a upper respiratory infection symptoms. Her stented to ED with worsening dyspnea on exertion. Now he can walks only a few steps and becomes dyspneic. No shortness of breath at rest. -  CXR Increased interstitial opacity with lung base atelectasis and probable small effusions. Favor interstitial edema. Viral/ atypical respiratory infection is the main differential consideration. Given IV lasix 40mg  x 1 in ED.  - His symptoms could could be from a viral respiratory infection versus worsening of heart failure.  2.  Acute on chronic systolic heart failure/ischemic cardiomyopathy - As above - Last echo (12/12/14) with EF 35%, grade 1 DD,  mildly dilated LV, wall motion abnormalities, normal RV size and systolic function. He has denied ICD in the past. BNP pending.  - Previously he was on Coreg 25 mg twice a day, lisinopril 10 mg twice a day and Crestor 10 mg daily. - Discharge on lasix 20mg  qd. F/u in clinic early next week 12/6 @11 :20am at CHF clinic.   3. CAD - No anginal pain. POC troponin negative. EKG non ischemic. Continue cycle enzymes. - Cath 12/2012 showed LAD and large ramus patent. The AV LCx covered a small  territory and is diffusely diseased. The RCA has multiple overlapping stents from the ostium to the distal vessel with damping upon engagement of the vessel. There is 95% ostial stenosis. The inferior wall is akinetic.The patient is  now s/p successful PCA and DES placement to ostial and proximal RCA 01/24/2013.  - He states that he will not take any antiplatelet therapy except aspirin. Consider bare metal stent placement in future if required.   4. Hyperglycemia - Check hemoglobin A1c.  5. Noncompliance - Encourage medication compliance and heart healthy dietary regimen.  6. HTN - Stable and well controlled despite not taking medication.  7. HL - Currently not taking statin. - Last LDL 88 10/2013. Will need repeat lipid panel.  Dispo: check A1c and lipid panel during outpatient follow up along with BMET.   SignedLeanor Kail, PA-C 11/14/2015, 2:41 PM Pager [phone]   Patient seen and examined with Vin Bhagat, PA-C. We discussed all aspects of the encounter. I agree with the assessment and plan as stated above. Patient with h/o systolic HF (EF [id number]). Previously followed in HF Clinic but has not shown x 1 year. Has been noncompliant with all meds. Presents with 2 weeks of worsening HF symptoms. CXR with CHF and bilateral effusions. Now much improved after > 2L IV diuresis in ER. Will let him go home. Start lasix 20mg  daily. No ACE or kcl due to K 5.2. Will see back in clinic next week with repeat labs. Hopefully can restart low dose ACE and carvedilol. Will need repeat echo. Needs to f/u with PCP for DM2. CAD currently stable. Also suggested he restart statin.   Bensimhon, Daniel,MD 5:17 PM

## 2015-11-14 NOTE — Telephone Encounter (Signed)
Left a message to pt to see how he is doing

## 2015-11-14 NOTE — Telephone Encounter (Signed)
[facility] [facility] Client [place] [facility] Patient Name: Wayne [name] Gender: Male DOB: 26-Jun-1941 Age: 74 Y 11 M 27 D Return Phone Number: [phone] (Primary) Address: City/State/Zip: [place] Client [facility] - Client Client Site [facility] - Day Physician Carolann Littler Contact Type Call Call Type Triage / Clinical Relationship To Patient Self Appointment Disposition EMR Appointment Not Necessary Info pasted into Epic Yes Return Phone Number [phone] (Primary) Chief Complaint BREATHING - shortness of breath or sounds breathless Initial Comment Caller states has a problem with his ear; short of breath; PreDisposition Call Doctor Nurse Assessment Nurse: Mechele Dawley, RN, Amy Date/Time Eilene Ghazi Time): 11/14/2015 11:05:22 AM Confirm and document reason for call. If symptomatic, describe symptoms. ---HE STATES THAT HE IS HAVING DIFFICULTY BREATHING. HE STATES THAT HE IS WHEEZING AT TIMES. WHEN HE GETS UP IN A MORNING. HE HAS A LOT OF CONGESTION. HE HAS SOB WITH ACTIVITY. WITH SITTING NO SOB. THIS IS THE FIRST TIME HES HAD ANY PROBLEMS LIKE THIS. HE ALSO STATES THAT HE FEELS LIKE WATER IS IN HIS EAR. NO FEVER. COUGHING - COUGHS UP PHLEGM. WHITE IN COLOR TO BROWN AT TIMES. HE HAS BEEN FEELING LIKE THIS ABOUT 3-4 DAYS. Has the patient traveled out of the country within the last 30 days? ---Not Applicable Does the patient have any new or worsening symptoms? ---Yes Will a triage be completed? ---Yes Related visit to physician within the last 2 weeks? ---No Does the PT have any chronic conditions? (i.e. diabetes, asthma, etc.) ---No Is this a behavioral health call? ---No Guidelines Guideline Title Affirmed Question Affirmed Notes Nurse Date/Time (Eastern Time) Cough - Acute Productive Difficulty breathing [place], RN, Amy 11/14/2015 11:06:31 AM Disp. Time Eilene Ghazi Time)  Disposition Final User 11/14/2015 11:01:39 AM Send to Urgent Queue [place], Jasmine PLEASE NOTE: All timestamps contained within this report are represented as [place] Standard Time. CONFIDENTIALTY NOTICE: This fax transmission is intended only for the addressee. It contains information that is legally privileged, confidential or otherwise protected from use or disclosure. If you are not the intended recipient, you are strictly prohibited from reviewing, disclosing, copying using or disseminating any of this information or taking any action in reliance on or regarding this information. If you have received this fax in error, please notify us immediately by telephone so that we can arrange for its return to [place]. Phone: [phone], Toll-Free: [phone], Fax: [phone] Page: 2 of 2 Call Id: [id number] 11/14/2015 11:08:45 AM Go to ED Now Yes Mechele Dawley, RN, Amy Caller Understands: Yes Disagree/Comply: Comply Care Advice Given Per Guideline GO TO ED NOW: You need to be seen in the Emergency Department. Go to the ER at ___________ [place] now. Drive carefully. CARE ADVICE given per Cough - Acute Productive (Adult) guideline. After Care Instructions Given Call Event Type User Date / Time Description Referrals GO

## 2015-11-15 ENCOUNTER — Other Ambulatory Visit (HOSPITAL_COMMUNITY): Payer: Self-pay | Admitting: *Deleted

## 2015-11-15 ENCOUNTER — Other Ambulatory Visit (HOSPITAL_COMMUNITY): Payer: Self-pay | Admitting: Cardiology

## 2015-11-15 ENCOUNTER — Telehealth: Payer: Self-pay | Admitting: Cardiology

## 2015-11-15 DIAGNOSIS — I5022 Chronic systolic (congestive) heart failure: Secondary | ICD-10-CM

## 2015-11-15 MED ORDER — FUROSEMIDE 20 MG PO TABS: 20.0000 mg | ORAL_TABLET | Freq: Every day | ORAL | Status: DC

## 2015-11-15 NOTE — Telephone Encounter (Signed)
Spoke with patient and made him aware of this and he stated that he thought he was supposed to start back taking it. Since he is seen in the CHF clinic, I provided him the number to call there to discuss this.

## 2015-11-15 NOTE — Telephone Encounter (Signed)
Pt is no longer on lasxi refill will not be submitted.

## 2015-11-15 NOTE — Telephone Encounter (Signed)
Pt request rx for lasix that was ordered in ER

## 2015-11-15 NOTE — Telephone Encounter (Signed)
New message      *STAT* If patient is at the pharmacy, call can be transferred to refill team.   1. Which medications need to be refilled? (please list name of each medication and dose if known) lasix 20 mg   2. Which pharmacy/location (including street and city if local pharmacy) is medication to be sent to? [facility] on [address]  Pt did not have the phone number    3. Do they need a 30 day or 90 day supply? 30 days supply

## 2015-11-20 ENCOUNTER — Encounter (HOSPITAL_COMMUNITY): Payer: Self-pay

## 2015-11-20 ENCOUNTER — Ambulatory Visit (HOSPITAL_COMMUNITY)
Admission: RE | Admit: 2015-11-20 | Discharge: 2015-11-20 | Disposition: A | Discharge status: Home / Self Care | Payer: Medicare Other | Source: Ambulatory Visit | Attending: Cardiology | Admitting: Cardiology

## 2015-11-20 VITALS — BP 122/74 | HR 90 | Wt 238.2 lb

## 2015-11-20 DIAGNOSIS — I2510 Atherosclerotic heart disease of native coronary artery without angina pectoris: Secondary | ICD-10-CM | POA: Insufficient documentation

## 2015-11-20 DIAGNOSIS — I5022 Chronic systolic (congestive) heart failure: Secondary | ICD-10-CM | POA: Diagnosis not present

## 2015-11-20 DIAGNOSIS — E785 Hyperlipidemia, unspecified: Secondary | ICD-10-CM | POA: Insufficient documentation

## 2015-11-20 LAB — BASIC METABOLIC PANEL
Anion gap: 11 (ref 5–15)
BUN: 18 mg/dL (ref 6–20)
CHLORIDE: 106 mmol/L (ref 101–111)
CO2: 24 mmol/L (ref 22–32)
Calcium: 9.3 mg/dL (ref 8.9–10.3)
Creatinine, Ser: 1.13 mg/dL (ref 0.61–1.24)
GFR calc non Af Amer: >60 mL/min (ref >60)
Glucose, Bld: 236 mg/dL — ABNORMAL HIGH (ref 65–99)
Potassium: 4.7 mmol/L (ref 3.5–5.1)
Sodium: 141 mmol/L (ref 135–145)

## 2015-11-20 LAB — BRAIN NATRIURETIC PEPTIDE: B NATRIURETIC PEPTIDE 5: 771 pg/mL — AB (ref 0.0–100.0)

## 2015-11-20 MED ORDER — SACUBITRIL-VALSARTAN 24-26 MG PO TABS: 1.0000 | ORAL_TABLET | Freq: Two times a day (BID) | ORAL | Status: DC

## 2015-11-20 MED ORDER — CARVEDILOL 3.125 MG PO TABS: 3.1250 mg | ORAL_TABLET | Freq: Two times a day (BID) | ORAL | Status: DC

## 2015-11-20 MED ORDER — ROSUVASTATIN CALCIUM 10 MG PO TABS: 10.0000 mg | ORAL_TABLET | Freq: Every day | ORAL | Status: DC

## 2015-11-20 MED ORDER — FUROSEMIDE 20 MG PO TABS: 20.0000 mg | ORAL_TABLET | Freq: Every day | ORAL | Status: DC

## 2015-11-20 NOTE — Progress Notes (Addendum)
Advanced Heart Failure Clinic Note   Patient ID: Wayne [name], male   DOB: [birth year], 74 y.o.   MRN: [id number] PCP: Dr. Elease Hashimoto Cardiology: Dr. Aundra Dubin  74 yo with history of DM, HTN, CAD, and ischemic cardiomyopathy presents for cardiology followup. Patient had a CHF exacerbation in 2/12 and was found to have LV systolic dysfunction with EF around 20%. LHC showed RCA, ramus, and CFX disease. RCA was subtotally occluded. Cardiac MRI showed that all walls, including the inferior wall, should be viable. Patient therefore underwent opening of his chronic totally occluded RCA as well as PCI to the ramus in 2/12. He received drug eluting stents and was on Effient.  He had an echo in 2/13 that showed EF improved to 45% with moderate LV dilation and mild LV hypertrophy.   In 5/13, he developed a large left lower leg hematoma.  He was still on Effient at that time.  ASA and Effient were stopped.  The hematoma did not resolve.  In 7/13, he was re-admitted with septic shock from MSSA from an abscess in his left gastrocnemius.  He also grew Pseudomonas from the left gastrocnemius as well.  He had a prolonged course in the hospital and later in a rehab facility.  Ultimately, he got back home again.  I had him get an echo in 1/14, and this showed EF 15% with diffuse hypokinesis and inferoposterior akinesis.  He had been off of most of his prior cardiac medications.  I took him back for [facility] in 1/14.  This showed subtotal occlusion of a small AV LCx and 95% ostial in-stent restenosis in the RCA.  He was treated in 2/14 with a Xience DES to the ostial RCA and begun on Plavix.  Unfortunately, he developed diffuse hives after starting Plavix and had to be switched to ticlopidine.  He has tolerated ticlopidine.   Echo done in 12/14 showed some improvement in LV function but EF was still low at 30-35%.  He did not want ICD.   Presented to ED 11/14/15 with worsening SOB after a few steps and CXR with CHF and bilateral  effusions in the setting of marked medical non-compliance States he stopped taking his medicines in 01/2015 (except for ASA 81 and a multivitamin).  He was feeling good and decided that he did not need them anymore.  Also c/o URI symptoms. Diuresed over 2 L with IV diuretics in the ER and started on lasix 20 mg daily. No ACEI started with K 5.2 that visit.  Weight 232 lbs.  He reports today for post hospital follow up after ED visit 11/14/15. He was last seen in the HF clinic 12/13/15 and has had multiple no-show appointments since then.  He states he had been feeling well last year, which is why he stopped his medicines and for the same reason did not follow up with [place].  Noticed last week that he was having significant DOE so came in to ED as above. SOB slightly better. Can make it around a grocery store without needing to stop. Denies orthopnea. + bendopnea. No lightheadedness, dizziness, or CP.   Labs (11/14): LDL 88, HDL 38 Labs (2/15): BNP 85, K 4.7, creatinine 1.0 Labs (10/15): K 4, creatinine 1.1 Labs (12/15): LDL 91, HDL 29 Labs (11/16): K 5.2, creatinine 1.11, HCT 46.4  ECG: NSR, poor RWP  Past Medical History:  1. HYPERTENSION  2. HYPERLIPIDEMIA: Myalgias with atorvastatin and Crestor 3. ECZEMA  4. RHINITIS  5. HERPES ZOSTER OPHTHALMICUS  6. ADENOCARCINOMA, PROSTATE: Status post prostatectomy in 2009. Has had some incontinence since then.  7. Diabetes mellitus type II  8. Arthritis  9. Obesity  10. GERD: rare  11. CAD: Presented with exertional dyspnea, never had chest pain. LHC (2/12) with subtotalled proximal RCA and left to right collaterals, 90% proximal moderate-sized ramus, 95% proximal relatively small CFX, 40-50% proximal LAD. Cardiac MRI (2/12) showed EF 21%, some mild scar in basal segments but all wall segments would be expected to be viable. DES x 5 (overlapping) to RCA, DES x 1 to RI 01/30/11 .  Bled into leg with Effient use (long, complicated course).  LHC (1/14): AV LCx  small with subtotal occlusion, 95% ostial instent restenosis in RCA. PCI in 2/14 to ostial RCA with 3.5 x 15 Xience DES.  Plavix allergy (hives) so put on ticlopidine.  12. Ischemic CMP: Echo (2/12) with moderately dilated LV, EF about 20% with diffuse hypokinesis and inferior akinesis, pseudonormal diastolic function, mild MR, severe LAE, mildly decreased RV systolic function. RHC (2/12) with mean RA 12, PA 40/25, mean PCWP 26, CI 2.1.  Echo (5/12) with EF 40% (appeared worse to my eye) with posterior HK, basal inferior AK, inferoseptal AK, basal anteroseptal AK, mild MR.  Cardiac MRI was repeated and showed EF 32% (improved from 21%) and mild LV dilation (was severely dilated before) with diffuse hypokinesis and subendocardial scar in the basal inferior, basal posterior, and basal anterolateral segments.  Echo (2/13) with EF 45%, moderate LV dilation, mild LVH.  Echo (1/14) with EF 15%, diffuse hypokinesis, inferoposterior akinesis, mild MR, grade I diastolic dysfunction.  Echo (5/14) with EF 30-35%, mild LV dilation, akinesis of the basal inferior wall otherwise diffuse hypokinesis.  Echo (12/14) with EF 30-35%, mild LV dilation, diffuse hypokinesis with basal inferior and posterior akinesis. Echo (12/15) with EF 35%, mildly dilated LV, wall motion abnormalities, normal RV size and systolic function.  13. Cervical OA.  14. Polymyalgia rheumatica 15. OSA: Severe on sleep study 10/12.  On CPAP.  16. Left lower leg hematoma in setting of Effient use.  He developed a left gastrocnemius abscess and septic shock with prolonged hospitalization beginning in 7/13.   Family History:  Father died with MI at age 25. He was an alcoholic. Mother died with cancer at around 74.   Social History:  Occupation: retired Administrator  Married, lives in [place]  Past smoker, quit around [place]: all systems reviewed and negative except as per HPI.    Current Outpatient Prescriptions  Medication Sig Dispense  Refill  . aspirin EC 81 MG tablet Take 81 mg by mouth daily.    . furosemide (LASIX) 20 MG tablet Take 1 tablet (20 mg total) by mouth daily. 90 tablet 3  . Multiple Vitamins-Minerals (CENTRUM SILVER ADULT 50+) TABS Take 1 tablet by mouth daily.     No current facility-administered medications for this encounter.    BP 122/74 mmHg  Pulse 90  Wt 238 lb 4 oz (108.069 kg)  SpO2 98%   Wt Readings from Last 3 Encounters:  11/20/15 238 lb 4 oz (108.069 kg)  11/14/15 232 lb 11.2 oz (105.552 kg)  12/12/14 237 lb 8 oz (107.729 kg)    General: NAD, obese.  Neck: Thick, JVP 8-9 cm, no thyromegaly or thyroid nodule. No carotid bruit.  Lungs: Bibasilar crackles CV: Nondisplaced PMI. Heart regular S1/S2, no S3/S4, no murmur.  Abdomen: Soft, NT, mild/moderate distention, no HSM. No bruits or masses. +BS  Neurologic: Alert and oriented x 3.  Psych: Pleasant affect.  Extremities: No clubbing or cyanosis. 1+ ankle edema bilaterally.   Assessment/Plan:  1. Chronic systolic CHF: Ischemic cardiomyopathy.  Last ECHO 12/12/14 showed EF 35%.  He has been noncompliant with meds, was seen in ER with CHF exacerbation last week.  He wants to know if he can stop Lasix but still has some volume overload on exam.  Now NYHA class II symptoms.  - Continue lasix 20 mg daily, pt resistant to increase.  - Start back on low dose Coreg at 3.125 mg BID (was on 25 mg bid prior). - Start Entresto 24/26 mg BID.  This will allow some additional diuresis. - Repeat BMET/BNP today and again in 10 days.  - Repeat Echo. If remains low will have further discussion on need for ICD. Pt has refused in the past and states he would still not consider ICD placement.  He would not be a CRT candidate with narrow QRS.  2. CAD: No CP.  Last PCI 01/2013. Completed his year-long course of ticlopidine.  He was allergic to Plavix and had severe gastrocnemius bleed with Effient. - Continue ASA 81, restart statin. 3. HLD: Has had myalgias with  Crestor 40 mg and atorvastatin.  - Resume Crestor 10 mg daily. Goal LDL < 70.  Check lipids once back on for several months.   Followup in 1 month.    Satira Mccallum Tillery PA-C 11/20/2015   Patient seen with PA, agree with the above note.  He stopped all his meds in 2/16 because he was "feeling good."  He has been very resistant to taking medications in the past.  Unfortunately, he has an ischemic cardiomyopathy with low EF and has CAD with multiple stents.  He finally developed a CHF exacerbation off his meds and is agreeable for the time being to restart them.    We had a conversation today about the importance of following his medical regimen and coming to his office visits.    He still has some volume overload on exam but is resistant to taking more Lasix.  I will continue the current Lasix and add Entresto, which may allow some additional diuresis.  He will restart on a low dose of Coreg, 3.125 mg bid and will titrate up as tolerated (was on 25 mg bid in the past).   I will also start him back on Crestor 10 mg daily, will need lipids checked in about 2 months.    BMET in 10 days and followup in 1 month for medication titration.   Loralie Champagne 11/20/2015

## 2015-11-20 NOTE — Patient Instructions (Signed)
Start Carvedilol 3.125 mg Twice daily   Start Entresto 24/26 mg Twice daily   Start Crestor 10 mg daily  Labs today  Labs in 10 days  Your physician has requested that you have an echocardiogram. Echocardiography is a painless test that uses sound waves to create images of your heart. It provides your doctor with information about the size and shape of your heart and how well your heart's chambers and valves are working. This procedure takes approximately one hour. There are no restrictions for this procedure.  Your physician recommends that you schedule a follow-up appointment in: 3 weeks

## 2015-11-20 NOTE — Addendum Note (Signed)
Encounter addended by: Larey Dresser, MD on: 11/20/2015 11:09 PM<BR>     Documentation filed: Notes Section

## 2015-11-22 NOTE — Telephone Encounter (Signed)
Patient was seen on 11/20/15 and med was refilled. Will close encounter.

## 2015-11-30 ENCOUNTER — Ambulatory Visit (HOSPITAL_COMMUNITY)
Admission: RE | Admit: 2015-11-30 | Discharge: 2015-11-30 | Disposition: A | Discharge status: Home / Self Care | Payer: Medicare Other | Source: Ambulatory Visit | Attending: Internal Medicine | Admitting: Internal Medicine

## 2015-11-30 DIAGNOSIS — I5022 Chronic systolic (congestive) heart failure: Secondary | ICD-10-CM | POA: Diagnosis not present

## 2015-11-30 LAB — BASIC METABOLIC PANEL
ANION GAP: 9 (ref 5–15)
BUN: 19 mg/dL (ref 6–20)
CALCIUM: 9.2 mg/dL (ref 8.9–10.3)
CO2: 24 mmol/L (ref 22–32)
Chloride: 106 mmol/L (ref 101–111)
Creatinine, Ser: 1.23 mg/dL (ref 0.61–1.24)
GFR calc Af Amer: >60 mL/min (ref >60)
GFR calc non Af Amer: 57 mL/min — ABNORMAL LOW (ref >60)
Glucose, Bld: 400 mg/dL — ABNORMAL HIGH (ref 65–99)
POTASSIUM: 4.9 mmol/L (ref 3.5–5.1)
Sodium: 139 mmol/L (ref 135–145)

## 2015-12-04 ENCOUNTER — Telehealth (HOSPITAL_COMMUNITY): Payer: Self-pay | Admitting: Pharmacist

## 2015-12-04 NOTE — Telephone Encounter (Signed)
[employer] patient assistance foundation would like Wayne [name] to apply for "extra help" through Medicare before they will provide Entresto at no cost. In the meantime, they will provide 3 monthly supplies at no cost. If he is denied from "extra help", they will continue to provide the [facility] for up to 1 year at no cost. Left VM for patient to call back so I can relay this information to him.   Ruta Hinds. Velva Harman, PharmD, BCPS, CPP Clinical Pharmacist Pager: [phone] Phone: [phone] 12/04/2015 4:42 PM

## 2015-12-13 ENCOUNTER — Ambulatory Visit (HOSPITAL_COMMUNITY)
Admission: RE | Admit: 2015-12-13 | Discharge: 2015-12-13 | Disposition: A | Discharge status: Home / Self Care | Payer: Medicare Other | Source: Ambulatory Visit | Attending: Cardiology | Admitting: Cardiology

## 2015-12-13 ENCOUNTER — Encounter (HOSPITAL_COMMUNITY): Payer: Self-pay

## 2015-12-13 ENCOUNTER — Ambulatory Visit (HOSPITAL_BASED_OUTPATIENT_CLINIC_OR_DEPARTMENT_OTHER)
Admission: RE | Admit: 2015-12-13 | Discharge: 2015-12-13 | Disposition: A | Discharge status: Home / Self Care | Payer: Medicare Other | Source: Ambulatory Visit | Attending: Cardiology | Admitting: Cardiology

## 2015-12-13 VITALS — BP 104/63 | HR 90 | Resp 18 | Wt 237.5 lb

## 2015-12-13 DIAGNOSIS — I509 Heart failure, unspecified: Secondary | ICD-10-CM | POA: Diagnosis present

## 2015-12-13 DIAGNOSIS — E785 Hyperlipidemia, unspecified: Secondary | ICD-10-CM | POA: Diagnosis not present

## 2015-12-13 DIAGNOSIS — I2510 Atherosclerotic heart disease of native coronary artery without angina pectoris: Secondary | ICD-10-CM | POA: Diagnosis not present

## 2015-12-13 DIAGNOSIS — E119 Type 2 diabetes mellitus without complications: Secondary | ICD-10-CM | POA: Diagnosis not present

## 2015-12-13 DIAGNOSIS — I10 Essential (primary) hypertension: Secondary | ICD-10-CM | POA: Diagnosis not present

## 2015-12-13 DIAGNOSIS — I340 Nonrheumatic mitral (valve) insufficiency: Secondary | ICD-10-CM | POA: Insufficient documentation

## 2015-12-13 DIAGNOSIS — I5022 Chronic systolic (congestive) heart failure: Secondary | ICD-10-CM

## 2015-12-13 DIAGNOSIS — I517 Cardiomegaly: Secondary | ICD-10-CM | POA: Insufficient documentation

## 2015-12-13 MED ORDER — CARVEDILOL 6.25 MG PO TABS: 3.1250 mg | ORAL_TABLET | Freq: Two times a day (BID) | ORAL | Status: DC

## 2015-12-13 MED ORDER — FUROSEMIDE 20 MG PO TABS: 40.0000 mg | ORAL_TABLET | Freq: Every day | ORAL | Status: DC

## 2015-12-13 NOTE — Progress Notes (Signed)
  Echocardiogram 2D Echocardiogram has been performed.  Nayab Aten 12/13/2015, 11:13 AM

## 2015-12-13 NOTE — Patient Instructions (Signed)
INCREASE Lasix to 40mg  daily.  INCREASE Coreg to 6.25mg  twice a day.  LABS: 10 days (bmet bnp)  FOLLOW UP: 3 weeks with Dr.McLean

## 2015-12-13 NOTE — Progress Notes (Signed)
Medication Samples have been provided to the patient.  Drug name: Entresto 24-26 mg  Qty: 28  LOT: [id number]  Exp.Date: 10/17  The patient has been instructed regarding the correct time, dose, and frequency of taking this medication, including desired effects and most common side effects.   Wayne [name] 12:23 PM 12/13/2015

## 2015-12-14 ENCOUNTER — Telehealth (HOSPITAL_COMMUNITY): Payer: Self-pay | Admitting: Pharmacist

## 2015-12-14 NOTE — Progress Notes (Signed)
Advanced Heart Failure Clinic Note   Patient ID: DMIR RIEF, male   DOB: 1941/09/26, 75 y.o.   MRN: [id number] PCP: Dr. Elease Hashimoto Cardiology: Dr. Aundra Dubin  74 yo with history of DM, HTN, CAD, and ischemic cardiomyopathy presents for cardiology followup. Patient had a CHF exacerbation in 2/12 and was found to have LV systolic dysfunction with EF around 20%. LHC showed RCA, ramus, and CFX disease. RCA was subtotally occluded. Cardiac MRI showed that all walls, including the inferior wall, should be viable. Patient therefore underwent opening of his chronic totally occluded RCA as well as PCI to the ramus in 2/12. He received drug eluting stents and was on Effient.  He had an echo in 2/13 that showed EF improved to 45% with moderate LV dilation and mild LV hypertrophy.   In 5/13, he developed a large left lower leg hematoma.  He was still on Effient at that time.  ASA and Effient were stopped.  The hematoma did not resolve.  In 7/13, he was re-admitted with septic shock from MSSA from an abscess in his left gastrocnemius.  He also grew Pseudomonas from the left gastrocnemius as well.  He had a prolonged course in the hospital and later in a rehab facility.  Ultimately, he got back home again.  I had him get an echo in 1/14, and this showed EF 15% with diffuse hypokinesis and inferoposterior akinesis.  He had been off of most of his prior cardiac medications.  I took him back for [facility] in 1/14.  This showed subtotal occlusion of a small AV LCx and 95% ostial in-stent restenosis in the RCA.  He was treated in 2/14 with a Xience DES to the ostial RCA and begun on Plavix.  Unfortunately, he developed diffuse hives after starting Plavix and had to be switched to ticlopidine.  He has tolerated ticlopidine.   Echo done in 12/14 showed some improvement in LV function but EF was still low at 30-35%.  He did not want ICD.   Presented to ED 11/14/15 with worsening SOB after a few steps and CXR with CHF and bilateral  effusions in the setting of marked medical non-compliance. States he stopped taking his medicines in 01/2015 (except for ASA 81 and a multivitamin).  He was feeling good and decided that he did not need them anymore.  Also c/o URI symptoms. Diuresed over 2 L with IV diuretics in the ER and started on lasix 20 mg daily. No ACEI started with K 5.2 that visit.  Weight 232 lbs.  At clinic visit a couple of weeks ago, he remained volume overloaded.  Delene Loll was started as well as low dose Coreg.  Echo was done, showing EF 20-25% with diffuse hypokinesis (down from 35% in 12/15).  Weight is down 1 lb.  He generally does ok walking on flat ground.  He is short of breath with steps and with heavier exertion.  He has orthopnea.  No chest pain.   Labs (11/14): LDL 88, HDL 38 Labs (2/15): BNP 85, K 4.7, creatinine 1.0 Labs (10/15): K 4, creatinine 1.1 Labs (12/15): LDL 91, HDL 29 Labs (11/16): K 5.2, creatinine 1.11, HCT 46.4 Labs (12/16): K 4.9, creatinine 1.23  Past Medical History:  1. HYPERTENSION  2. HYPERLIPIDEMIA: Myalgias with atorvastatin and Crestor 3. ECZEMA  4. RHINITIS  5. HERPES ZOSTER OPHTHALMICUS  6. ADENOCARCINOMA, PROSTATE: Status post prostatectomy in 2009. Has had some incontinence since then.  7. Diabetes mellitus type II  8. Arthritis  9. Obesity  10. GERD: rare  11. CAD: Presented with exertional dyspnea, never had chest pain. LHC (2/12) with subtotalled proximal RCA and left to right collaterals, 90% proximal moderate-sized ramus, 95% proximal relatively small CFX, 40-50% proximal LAD. Cardiac MRI (2/12) showed EF 21%, some mild scar in basal segments but all wall segments would be expected to be viable. DES x 5 (overlapping) to RCA, DES x 1 to RI 01/30/11 .  Bled into leg with Effient use (long, complicated course).  LHC (1/14): AV LCx small with subtotal occlusion, 95% ostial instent restenosis in RCA. PCI in 2/14 to ostial RCA with 3.5 x 15 Xience DES.  Plavix allergy (hives) so  put on ticlopidine.  12. Ischemic CMP: Echo (2/12) with moderately dilated LV, EF about 20% with diffuse hypokinesis and inferior akinesis, pseudonormal diastolic function, mild MR, severe LAE, mildly decreased RV systolic function. RHC (2/12) with mean RA 12, PA 40/25, mean PCWP 26, CI 2.1.  Echo (5/12) with EF 40% (appeared worse to my eye) with posterior HK, basal inferior AK, inferoseptal AK, basal anteroseptal AK, mild MR.  Cardiac MRI was repeated and showed EF 32% (improved from 21%) and mild LV dilation (was severely dilated before) with diffuse hypokinesis and subendocardial scar in the basal inferior, basal posterior, and basal anterolateral segments.  Echo (2/13) with EF 45%, moderate LV dilation, mild LVH.  Echo (1/14) with EF 15%, diffuse hypokinesis, inferoposterior akinesis, mild MR, grade I diastolic dysfunction.  Echo (5/14) with EF 30-35%, mild LV dilation, akinesis of the basal inferior wall otherwise diffuse hypokinesis.  Echo (12/14) with EF 30-35%, mild LV dilation, diffuse hypokinesis with basal inferior and posterior akinesis. Echo (12/15) with EF 35%, mildly dilated LV, wall motion abnormalities, normal RV size and systolic function. Echo (12/16) with EF 20-25%, diffuse hypokinesis, severe LV dilation, mild MR.  13. Cervical OA.  14. Polymyalgia rheumatica 15. OSA: Severe on sleep study 10/12.  On CPAP.  16. Left lower leg hematoma in setting of Effient use.  He developed a left gastrocnemius abscess and septic shock with prolonged hospitalization beginning in 7/13.   Family History:  Father died with MI at age 33. He was an alcoholic. Mother died with cancer at around 59.   Social History:  Occupation: retired Administrator  Married, lives in [place]  Past smoker, quit around [place]: all systems reviewed and negative except as per HPI.    Current Outpatient Prescriptions  Medication Sig Dispense Refill  . aspirin EC 81 MG tablet Take 81 mg by mouth daily.    .  carvedilol (COREG) 6.25 MG tablet Take 0.5 tablets (3.125 mg total) by mouth 2 (two) times daily. 60 tablet 3  . furosemide (LASIX) 20 MG tablet Take 2 tablets (40 mg total) by mouth daily. 60 tablet 3  . Multiple Vitamins-Minerals (CENTRUM SILVER ADULT 50+) TABS Take 1 tablet by mouth daily.    . rosuvastatin (CRESTOR) 10 MG tablet Take 10 mg by mouth daily.    . sacubitril-valsartan (ENTRESTO) 24-26 MG Take 1 tablet by mouth 2 (two) times daily. 60 tablet 11   No current facility-administered medications for this encounter.    BP 104/63 mmHg  Pulse 90  Resp 18  Wt 237 lb 8 oz (107.729 kg)  SpO2 97%   Wt Readings from Last 3 Encounters:  12/13/15 237 lb 8 oz (107.729 kg)  11/20/15 238 lb 4 oz (108.069 kg)  11/14/15 232 lb 11.2 oz (105.552 kg)  General: NAD, obese.  Neck: Thick, JVP 8-9 cm, no thyromegaly or thyroid nodule. No carotid bruit.  Lungs: Bibasilar crackles CV: Nondisplaced PMI. Heart regular S1/S2, no S3/S4, no murmur.  Abdomen: Soft, NT, mild/moderate distention, no HSM. No bruits or masses. +BS  Neurologic: Alert and oriented x 3.  Psych: Pleasant affect.  Extremities: No clubbing or cyanosis. 1+ edema 1/2 to knees bilaterally.   Assessment/Plan:  1. Chronic systolic CHF: Ischemic cardiomyopathy.  12/16 echo with EF 20-25%, diffuse hypokinesis.  NYHA class II-III symptoms with ongoing volume overloaded on exam (relatively mild).  He is back on medications and seems willing to continue them.  - Increase Lasix to 40 mg daily. BMET/BNP in 10 days.  - Can increase Coreg back to 6.25 mg bid (was on 25 mg bid prior). - Continue Entresto 24/26 mg BID.  - We discussed ICD today. Pt has refused in the past and states he would still not consider ICD placement.  He would not be a CRT candidate with narrow QRS.  2. CAD: No CP.  Last PCI 01/2013. Completed his year-long course of ticlopidine.  He was allergic to Plavix and had severe gastrocnemius bleed with Effient. -  Continue ASA 81, he has restarted statin. 3. HLD: Has had myalgias with Crestor 40 mg and atorvastatin.  - He restarted Crestor 10 mg daily. Goal LDL < 70.  Check lipids once back on for several months.   Followup in 3 wks.    Loralie Champagne  12/14/2015

## 2015-12-14 NOTE — Telephone Encounter (Signed)
Received fax from [employer] that Wayne [name] was approved through 12/02/2016 at no charge to the patient.  Ruta Hinds. Velva Harman, PharmD, BCPS, CPP Clinical Pharmacist Pager: [phone] Phone: [phone] 12/14/2015 3:36 PM

## 2015-12-21 ENCOUNTER — Ambulatory Visit (HOSPITAL_COMMUNITY)
Admission: RE | Admit: 2015-12-21 | Discharge: 2015-12-21 | Disposition: A | Discharge status: Home / Self Care | Payer: Medicare Other | Source: Ambulatory Visit | Attending: Cardiology | Admitting: Cardiology

## 2015-12-21 DIAGNOSIS — I5022 Chronic systolic (congestive) heart failure: Secondary | ICD-10-CM | POA: Diagnosis not present

## 2015-12-21 DIAGNOSIS — I5023 Acute on chronic systolic (congestive) heart failure: Secondary | ICD-10-CM | POA: Diagnosis not present

## 2015-12-21 LAB — BASIC METABOLIC PANEL
Anion gap: 10 (ref 5–15)
BUN: 16 mg/dL (ref 6–20)
CHLORIDE: 109 mmol/L (ref 101–111)
CO2: 24 mmol/L (ref 22–32)
CREATININE: 1.15 mg/dL (ref 0.61–1.24)
Calcium: 9.2 mg/dL (ref 8.9–10.3)
GFR calc Af Amer: >60 mL/min (ref >60)
GFR calc non Af Amer: >60 mL/min (ref >60)
Glucose, Bld: 274 mg/dL — ABNORMAL HIGH (ref 65–99)
Potassium: 4.7 mmol/L (ref 3.5–5.1)
Sodium: 143 mmol/L (ref 135–145)

## 2015-12-21 LAB — BRAIN NATRIURETIC PEPTIDE: B Natriuretic Peptide: 1433.1 pg/mL — ABNORMAL HIGH (ref 0.0–100.0)

## 2016-01-02 DIAGNOSIS — C61 Malignant neoplasm of prostate: Secondary | ICD-10-CM | POA: Diagnosis not present

## 2016-01-02 DIAGNOSIS — R350 Frequency of micturition: Secondary | ICD-10-CM | POA: Diagnosis not present

## 2016-01-02 DIAGNOSIS — N5201 Erectile dysfunction due to arterial insufficiency: Secondary | ICD-10-CM | POA: Diagnosis not present

## 2016-01-02 DIAGNOSIS — Z0000 Encounter for general adult medical examination without abnormal findings: Secondary | ICD-10-CM | POA: Diagnosis not present

## 2016-01-03 ENCOUNTER — Encounter (HOSPITAL_COMMUNITY): Payer: Self-pay

## 2016-01-03 ENCOUNTER — Ambulatory Visit (HOSPITAL_COMMUNITY)
Admission: RE | Admit: 2016-01-03 | Discharge: 2016-01-03 | Disposition: A | Discharge status: Home / Self Care | Payer: Medicare Other | Source: Ambulatory Visit | Attending: Cardiology | Admitting: Cardiology

## 2016-01-03 VITALS — BP 116/68 | HR 86 | Wt 238.0 lb

## 2016-01-03 DIAGNOSIS — E785 Hyperlipidemia, unspecified: Secondary | ICD-10-CM | POA: Diagnosis not present

## 2016-01-03 DIAGNOSIS — I5022 Chronic systolic (congestive) heart failure: Secondary | ICD-10-CM | POA: Insufficient documentation

## 2016-01-03 DIAGNOSIS — I2510 Atherosclerotic heart disease of native coronary artery without angina pectoris: Secondary | ICD-10-CM | POA: Diagnosis not present

## 2016-01-03 MED ORDER — CARVEDILOL 6.25 MG PO TABS: 6.2500 mg | ORAL_TABLET | Freq: Two times a day (BID) | ORAL | Status: DC

## 2016-01-03 MED ORDER — SPIRONOLACTONE 25 MG PO TABS: 12.5000 mg | ORAL_TABLET | Freq: Every day | ORAL | Status: DC

## 2016-01-03 MED ORDER — FUROSEMIDE 20 MG PO TABS: 60.0000 mg | ORAL_TABLET | Freq: Every day | ORAL | Status: DC

## 2016-01-03 NOTE — Patient Instructions (Addendum)
Increase Lasix to 60mg  (3 tablets) twice a day.  Start Spironolactone 12.5 mg(1/2 tablet) daily.  Stop Potassium  Labs: 10 days (bmet bnp)  Follow up: 1 month with Dr.McLean  Do the following things EVERYDAY: 1) Weigh yourself in the morning before breakfast. Write it down and keep it in a log. 2) Take your medicines as prescribed 3) Eat low salt foods-Limit salt (sodium) to 2000 mg per day.  4) Stay as active as you can everyday 5) Limit all fluids for the day to less than 2 liters

## 2016-01-03 NOTE — Progress Notes (Signed)
Patient ID: Wayne [name], male   DOB: 1941-05-30, 75 y.o.   MRN: [id number]   Advanced Heart Failure Clinic Note   Patient ID: Wayne [name], male   DOB: July 31, 1941, 75 y.o.   MRN: [id number] PCP: Dr. Elease Hashimoto Cardiology: Dr. Aundra Dubin  75 yo with history of DM, HTN, CAD, and ischemic cardiomyopathy presents for cardiology followup. Patient had a CHF exacerbation in 2/12 and was found to have LV systolic dysfunction with EF around 20%. LHC showed RCA, ramus, and CFX disease. RCA was subtotally occluded. Cardiac MRI showed that all walls, including the inferior wall, should be viable. Patient therefore underwent opening of his chronic totally occluded RCA as well as PCI to the ramus in 2/12. He received drug eluting stents and was on Effient.  He had an echo in 2/13 that showed EF improved to 45% with moderate LV dilation and mild LV hypertrophy.   In 5/13, he developed a large left lower leg hematoma.  He was still on Effient at that time.  ASA and Effient were stopped.  The hematoma did not resolve.  In 7/13, he was re-admitted with septic shock from MSSA from an abscess in his left gastrocnemius.  He also grew Pseudomonas from the left gastrocnemius as well.  He had a prolonged course in the hospital and later in a rehab facility.  Ultimately, he got back home again.  I had him get an echo in 1/14, and this showed EF 15% with diffuse hypokinesis and inferoposterior akinesis.  He had been off of most of his prior cardiac medications.  I took him back for [facility] in 1/14.  This showed subtotal occlusion of a small AV LCx and 95% ostial in-stent restenosis in the RCA.  He was treated in 2/14 with a Xience DES to the ostial RCA and begun on Plavix.  Unfortunately, he developed diffuse hives after starting Plavix and had to be switched to ticlopidine.  He has tolerated ticlopidine.   Echo done in 12/14 showed some improvement in LV function but EF was still low at 30-35%.  He did not want ICD.   Presented to ED  11/14/15 with worsening SOB after a few steps and CXR with CHF and bilateral effusions in the setting of marked medical non-compliance. States he stopped taking his medicines in 01/2015 (except for ASA 81 and a multivitamin).  He was feeling good and decided that he did not need them anymore.  Also c/o URI symptoms. Diuresed over 2 L with IV diuretics in the ER and started on lasix 20 mg daily.   Echo (12/16) showed EF 20-25% with diffuse hypokinesis (down from 35% in 12/15).    Weight is up 1 lb today.  He is taking Lasix 40 mg bid at home.  He continues to have episodes of PND though he does not have significant orthopnea.  The higher dose of Lasix has helped his exercise tolerance.  He is short of breath on steps but ok on flat ground.  He continues to have abdominal bloating.  No chest pain.   Labs (11/14): LDL 88, HDL 38 Labs (2/15): BNP 85, K 4.7, creatinine 1.0 Labs (10/15): K 4, creatinine 1.1 Labs (12/15): LDL 91, HDL 29 Labs (11/16): K 5.2, creatinine 1.11, HCT 46.4 Labs (12/16): K 4.9, creatinine 1.23 Labs (1/17): K 4.7, creatinine 1.15, BNP 1433  Past Medical History:  1. HYPERTENSION  2. HYPERLIPIDEMIA: Myalgias with atorvastatin and Crestor 3. ECZEMA  4. RHINITIS  5. HERPES ZOSTER OPHTHALMICUS  6. ADENOCARCINOMA, PROSTATE: Status post prostatectomy in 2009. Has had some incontinence since then.  7. Diabetes mellitus type II  8. Arthritis  9. Obesity  10. GERD: rare  11. CAD: Presented with exertional dyspnea, never had chest pain. LHC (2/12) with subtotalled proximal RCA and left to right collaterals, 90% proximal moderate-sized ramus, 95% proximal relatively small CFX, 40-50% proximal LAD. Cardiac MRI (2/12) showed EF 21%, some mild scar in basal segments but all wall segments would be expected to be viable. DES x 5 (overlapping) to RCA, DES x 1 to RI 01/30/11 .  Bled into leg with Effient use (long, complicated course).  LHC (1/14): AV LCx small with subtotal occlusion, 95%  ostial instent restenosis in RCA. PCI in 2/14 to ostial RCA with 3.5 x 15 Xience DES.  Plavix allergy (hives) so put on ticlopidine.  12. Ischemic CMP: Echo (2/12) with moderately dilated LV, EF about 20% with diffuse hypokinesis and inferior akinesis, pseudonormal diastolic function, mild MR, severe LAE, mildly decreased RV systolic function. RHC (2/12) with mean RA 12, PA 40/25, mean PCWP 26, CI 2.1.  Echo (5/12) with EF 40% (appeared worse to my eye) with posterior HK, basal inferior AK, inferoseptal AK, basal anteroseptal AK, mild MR.  Cardiac MRI was repeated and showed EF 32% (improved from 21%) and mild LV dilation (was severely dilated before) with diffuse hypokinesis and subendocardial scar in the basal inferior, basal posterior, and basal anterolateral segments.  Echo (2/13) with EF 45%, moderate LV dilation, mild LVH.  Echo (1/14) with EF 15%, diffuse hypokinesis, inferoposterior akinesis, mild MR, grade I diastolic dysfunction.  Echo (5/14) with EF 30-35%, mild LV dilation, akinesis of the basal inferior wall otherwise diffuse hypokinesis.  Echo (12/14) with EF 30-35%, mild LV dilation, diffuse hypokinesis with basal inferior and posterior akinesis. Echo (12/15) with EF 35%, mildly dilated LV, wall motion abnormalities, normal RV size and systolic function. Echo (12/16) with EF 20-25%, diffuse hypokinesis, severe LV dilation, mild MR.  13. Cervical OA.  14. Polymyalgia rheumatica 15. OSA: Severe on sleep study 10/12.  On CPAP.  16. Left lower leg hematoma in setting of Effient use.  He developed a left gastrocnemius abscess and septic shock with prolonged hospitalization beginning in 7/13.   Family History:  Father died with MI at age 54. He was an alcoholic. Mother died with cancer at around 3.   Social History:  Occupation: retired Administrator  Married, lives in [place]  Past smoker, quit around [place]: all systems reviewed and negative except as per HPI.    Current  Outpatient Prescriptions  Medication Sig Dispense Refill  . aspirin EC 81 MG tablet Take 81 mg by mouth daily.    . carvedilol (COREG) 6.25 MG tablet Take 1 tablet (6.25 mg total) by mouth 2 (two) times daily. 60 tablet 3  . furosemide (LASIX) 20 MG tablet Take 3 tablets (60 mg total) by mouth daily. 90 tablet 3  . Multiple Vitamins-Minerals (CENTRUM SILVER ADULT 50+) TABS Take 1 tablet by mouth daily.    . rosuvastatin (CRESTOR) 10 MG tablet Take 10 mg by mouth daily.    . sacubitril-valsartan (ENTRESTO) 24-26 MG Take 1 tablet by mouth 2 (two) times daily. 60 tablet 11  . spironolactone (ALDACTONE) 25 MG tablet Take 0.5 tablets (12.5 mg total) by mouth daily. 15 tablet 3   No current facility-administered medications for this encounter.    BP 116/68 mmHg  Pulse 86  Wt 238 lb (107.956  kg)  SpO2 98%   Wt Readings from Last 3 Encounters:  01/03/16 238 lb (107.956 kg)  12/13/15 237 lb 8 oz (107.729 kg)  11/20/15 238 lb 4 oz (108.069 kg)    General: NAD, obese.  Neck: Thick, JVP 8-9 cm, no thyromegaly or thyroid nodule. No carotid bruit.  Lungs: Bibasilar crackles CV: Nondisplaced PMI. Heart regular S1/S2, no S3/S4, no murmur.  Abdomen: Soft, NT, mild/moderate distention, no HSM. No bruits or masses. +BS  Neurologic: Alert and oriented x 3.  Psych: Pleasant affect.  Extremities: No clubbing or cyanosis. 1+ ankle edema.   Assessment/Plan:  1. Chronic systolic CHF: Ischemic cardiomyopathy.  12/16 echo with EF 20-25%, diffuse hypokinesis.  NYHA class II-III symptoms with ongoing volume overloaded on exam.  He is back on medications and seems willing to continue them.  - Increase Lasix to 60 mg bid.  BMET/BNP in 10 days.  - Continue Coreg 6.25 mg bid and Entresto 24/26 bid. - Add spironolactone 12.5 daily and stop KCl.   - Pt has refused ICD.  He would not be a CRT candidate with narrow QRS.  2. CAD: No CP.  Last PCI 01/2013. Completed his year-long course of ticlopidine.  He was  allergic to Plavix and had severe gastrocnemius bleed with Effient. - Continue ASA 81, he has restarted statin. 3. HLD: Has had myalgias with Crestor 40 mg and atorvastatin.  - He restarted Crestor 10 mg daily. Goal LDL < 70.  Check lipids once back on for several months (3/17).   Followup in 1 month.     Loralie Champagne  01/03/2016

## 2016-01-10 ENCOUNTER — Ambulatory Visit (HOSPITAL_COMMUNITY)
Admission: RE | Admit: 2016-01-10 | Discharge: 2016-01-10 | Disposition: A | Discharge status: Home / Self Care | Payer: Medicare Other | Source: Ambulatory Visit | Attending: Cardiology | Admitting: Cardiology

## 2016-01-10 DIAGNOSIS — I5022 Chronic systolic (congestive) heart failure: Secondary | ICD-10-CM | POA: Diagnosis not present

## 2016-01-10 LAB — BASIC METABOLIC PANEL
Anion gap: 11 (ref 5–15)
BUN: 24 mg/dL — AB (ref 6–20)
CALCIUM: 9.5 mg/dL (ref 8.9–10.3)
CHLORIDE: 102 mmol/L (ref 101–111)
CO2: 24 mmol/L (ref 22–32)
CREATININE: 1.14 mg/dL (ref 0.61–1.24)
GFR calc non Af Amer: >60 mL/min (ref >60)
Glucose, Bld: 360 mg/dL — ABNORMAL HIGH (ref 65–99)
Potassium: 4.7 mmol/L (ref 3.5–5.1)
Sodium: 137 mmol/L (ref 135–145)

## 2016-01-10 LAB — BRAIN NATRIURETIC PEPTIDE: B Natriuretic Peptide: 870 pg/mL — ABNORMAL HIGH (ref 0.0–100.0)

## 2016-01-11 ENCOUNTER — Other Ambulatory Visit (HOSPITAL_COMMUNITY): Payer: Medicare Other

## 2016-02-05 ENCOUNTER — Ambulatory Visit (HOSPITAL_COMMUNITY)
Admission: RE | Admit: 2016-02-05 | Discharge: 2016-02-05 | Disposition: A | Discharge status: Home / Self Care | Payer: Medicare Other | Source: Ambulatory Visit | Attending: Cardiology | Admitting: Cardiology

## 2016-02-05 ENCOUNTER — Other Ambulatory Visit (HOSPITAL_COMMUNITY): Payer: Self-pay | Admitting: Cardiology

## 2016-02-05 ENCOUNTER — Encounter (HOSPITAL_COMMUNITY): Payer: Self-pay

## 2016-02-05 VITALS — BP 102/59 | HR 99 | Resp 18 | Wt 239.5 lb

## 2016-02-05 DIAGNOSIS — I739 Peripheral vascular disease, unspecified: Secondary | ICD-10-CM

## 2016-02-05 DIAGNOSIS — I110 Hypertensive heart disease with heart failure: Secondary | ICD-10-CM | POA: Diagnosis not present

## 2016-02-05 DIAGNOSIS — Z955 Presence of coronary angioplasty implant and graft: Secondary | ICD-10-CM | POA: Diagnosis not present

## 2016-02-05 DIAGNOSIS — I255 Ischemic cardiomyopathy: Secondary | ICD-10-CM | POA: Insufficient documentation

## 2016-02-05 DIAGNOSIS — Z87891 Personal history of nicotine dependence: Secondary | ICD-10-CM | POA: Insufficient documentation

## 2016-02-05 DIAGNOSIS — E785 Hyperlipidemia, unspecified: Secondary | ICD-10-CM | POA: Diagnosis not present

## 2016-02-05 DIAGNOSIS — Z7982 Long term (current) use of aspirin: Secondary | ICD-10-CM | POA: Insufficient documentation

## 2016-02-05 DIAGNOSIS — E669 Obesity, unspecified: Secondary | ICD-10-CM | POA: Insufficient documentation

## 2016-02-05 DIAGNOSIS — G4733 Obstructive sleep apnea (adult) (pediatric): Secondary | ICD-10-CM | POA: Diagnosis not present

## 2016-02-05 DIAGNOSIS — Z8546 Personal history of malignant neoplasm of prostate: Secondary | ICD-10-CM | POA: Insufficient documentation

## 2016-02-05 DIAGNOSIS — M79662 Pain in left lower leg: Secondary | ICD-10-CM | POA: Diagnosis not present

## 2016-02-05 DIAGNOSIS — E119 Type 2 diabetes mellitus without complications: Secondary | ICD-10-CM | POA: Diagnosis not present

## 2016-02-05 DIAGNOSIS — I5022 Chronic systolic (congestive) heart failure: Secondary | ICD-10-CM | POA: Insufficient documentation

## 2016-02-05 DIAGNOSIS — I2510 Atherosclerotic heart disease of native coronary artery without angina pectoris: Secondary | ICD-10-CM | POA: Diagnosis not present

## 2016-02-05 DIAGNOSIS — Z888 Allergy status to other drugs, medicaments and biological substances status: Secondary | ICD-10-CM | POA: Insufficient documentation

## 2016-02-05 DIAGNOSIS — Z8249 Family history of ischemic heart disease and other diseases of the circulatory system: Secondary | ICD-10-CM | POA: Diagnosis not present

## 2016-02-05 DIAGNOSIS — Z79899 Other long term (current) drug therapy: Secondary | ICD-10-CM | POA: Insufficient documentation

## 2016-02-05 DIAGNOSIS — M353 Polymyalgia rheumatica: Secondary | ICD-10-CM | POA: Insufficient documentation

## 2016-02-05 NOTE — Progress Notes (Signed)
Patient ID: Wayne [name], male   DOB: 03-30-1941, 75 y.o.   MRN: [id number]   Advanced Heart Failure Clinic Note   Patient ID: Wayne [name], male   DOB: 12/04/1941, 75 y.o.   MRN: [id number] PCP: Dr. Elease Hashimoto Cardiology: Dr. Aundra Dubin  75 yo with history of DM, HTN, CAD, and ischemic cardiomyopathy presents for cardiology followup. Patient had a CHF exacerbation in 2/12 and was found to have LV systolic dysfunction with EF around 20%. LHC showed RCA, ramus, and CFX disease. RCA was subtotally occluded. Cardiac MRI showed that all walls, including the inferior wall, should be viable. Patient therefore underwent opening of his chronic totally occluded RCA as well as PCI to the ramus in 2/12. He received drug eluting stents and was on Effient.  He had an echo in 2/13 that showed EF improved to 45% with moderate LV dilation and mild LV hypertrophy.   In 5/13, he developed a large left lower leg hematoma.  He was still on Effient at that time.  ASA and Effient were stopped.  The hematoma did not resolve.  In 7/13, he was re-admitted with septic shock from MSSA from an abscess in his left gastrocnemius.  He also grew Pseudomonas from the left gastrocnemius as well.  He had a prolonged course in the hospital and later in a rehab facility.  Ultimately, he got back home again.  I had him get an echo in 1/14, and this showed EF 15% with diffuse hypokinesis and inferoposterior akinesis.  He had been off of most of his prior cardiac medications.  I took him back for [facility] in 1/14.  This showed subtotal occlusion of a small AV LCx and 95% ostial in-stent restenosis in the RCA.  He was treated in 2/14 with a Xience DES to the ostial RCA and begun on Plavix.  Unfortunately, he developed diffuse hives after starting Plavix and had to be switched to ticlopidine.  He has tolerated ticlopidine.   Echo done in 12/14 showed some improvement in LV function but EF was still low at 30-35%.  He did not want ICD.   Presented to ED  11/14/15 with worsening SOB after a few steps and CXR with CHF and bilateral effusions in the setting of marked medical non-compliance. States he stopped taking his medicines in 01/2015 (except for ASA 81 and a multivitamin).  He was feeling good and decided that he did not need them anymore.  Also c/o URI symptoms. Diuresed over 2 L with IV diuretics in the ER and started on lasix 20 mg daily.   Echo (12/16) showed EF 20-25% with diffuse hypokinesis (down from 35% in 12/15).    Weight is up 1 lb today.  He is taking Lasix 60 mg daily at home (I thought he was taking 60 mg bid). He never started spironolactone.  Says he feels "75% better" compared to the early winter.  No orthopnea/PND.  He is short of breath on steps but ok on flat ground.  No chest pain. Occasional bendopnea. He has left calf pain with ambulation.   Labs (11/14): LDL 88, HDL 38 Labs (2/15): BNP 85, K 4.7, creatinine 1.0 Labs (10/15): K 4, creatinine 1.1 Labs (12/15): LDL 91, HDL 29 Labs (11/16): K 5.2, creatinine 1.11, HCT 46.4 Labs (12/16): K 4.9, creatinine 1.23 Labs (1/17): K 4.7, creatinine 1.15, BNP 1433 Labs (2/17): K 4.7, creatinine 1.14, BNP 870  Past Medical History:  1. HYPERTENSION  2. HYPERLIPIDEMIA: Myalgias with atorvastatin and Crestor  3. ECZEMA  4. RHINITIS  5. HERPES ZOSTER OPHTHALMICUS  6. ADENOCARCINOMA, PROSTATE: Status post prostatectomy in 2009. Has had some incontinence since then.  7. Diabetes mellitus type II  8. Arthritis  9. Obesity  10. GERD: rare  11. CAD: Presented with exertional dyspnea, never had chest pain. LHC (2/12) with subtotalled proximal RCA and left to right collaterals, 90% proximal moderate-sized ramus, 95% proximal relatively small CFX, 40-50% proximal LAD. Cardiac MRI (2/12) showed EF 21%, some mild scar in basal segments but all wall segments would be expected to be viable. DES x 5 (overlapping) to RCA, DES x 1 to RI 01/30/11 .  Bled into leg with Effient use (long, complicated  course).  LHC (1/14): AV LCx small with subtotal occlusion, 95% ostial instent restenosis in RCA. PCI in 2/14 to ostial RCA with 3.5 x 15 Xience DES.  Plavix allergy (hives) so put on ticlopidine.  12. Ischemic CMP: Echo (2/12) with moderately dilated LV, EF about 20% with diffuse hypokinesis and inferior akinesis, pseudonormal diastolic function, mild MR, severe LAE, mildly decreased RV systolic function. RHC (2/12) with mean RA 12, PA 40/25, mean PCWP 26, CI 2.1.  Echo (5/12) with EF 40% (appeared worse to my eye) with posterior HK, basal inferior AK, inferoseptal AK, basal anteroseptal AK, mild MR.  Cardiac MRI was repeated and showed EF 32% (improved from 21%) and mild LV dilation (was severely dilated before) with diffuse hypokinesis and subendocardial scar in the basal inferior, basal posterior, and basal anterolateral segments.  Echo (2/13) with EF 45%, moderate LV dilation, mild LVH.  Echo (1/14) with EF 15%, diffuse hypokinesis, inferoposterior akinesis, mild MR, grade I diastolic dysfunction.  Echo (5/14) with EF 30-35%, mild LV dilation, akinesis of the basal inferior wall otherwise diffuse hypokinesis.  Echo (12/14) with EF 30-35%, mild LV dilation, diffuse hypokinesis with basal inferior and posterior akinesis. Echo (12/15) with EF 35%, mildly dilated LV, wall motion abnormalities, normal RV size and systolic function. Echo (12/16) with EF 20-25%, diffuse hypokinesis, severe LV dilation, mild MR.  13. Cervical OA.  14. Polymyalgia rheumatica 15. OSA: Severe on sleep study 10/12.  On CPAP.  16. Left lower leg hematoma in setting of Effient use.  He developed a left gastrocnemius abscess and septic shock with prolonged hospitalization beginning in 7/13.   Family History:  Father died with MI at age 9. He was an alcoholic. Mother died with cancer at around 39.   Social History:  Occupation: retired Administrator  Married, lives in [place]  Past smoker, quit around [place]: all systems  reviewed and negative except as per HPI.    Current Outpatient Prescriptions  Medication Sig Dispense Refill  . aspirin EC 81 MG tablet Take 81 mg by mouth daily.    . carvedilol (COREG) 6.25 MG tablet Take 1 tablet (6.25 mg total) by mouth 2 (two) times daily. 60 tablet 3  . furosemide (LASIX) 20 MG tablet Take 3 tablets (60 mg total) by mouth daily. 90 tablet 3  . Multiple Vitamins-Minerals (CENTRUM SILVER ADULT 50+) TABS Take 1 tablet by mouth daily.    . rosuvastatin (CRESTOR) 10 MG tablet Take 10 mg by mouth daily.    . sacubitril-valsartan (ENTRESTO) 24-26 MG Take 1 tablet by mouth 2 (two) times daily. 60 tablet 11  . spironolactone (ALDACTONE) 25 MG tablet Take 0.5 tablets (12.5 mg total) by mouth daily. 15 tablet 3   No current facility-administered medications for this encounter.  BP 102/59 mmHg  Pulse 99  Resp 18  Wt 239 lb 8 oz (108.636 kg)  SpO2 95%   Wt Readings from Last 3 Encounters:  02/05/16 239 lb 8 oz (108.636 kg)  01/03/16 238 lb (107.956 kg)  12/13/15 237 lb 8 oz (107.729 kg)    General: NAD, obese.  Neck: Thick, JVP 7 cm, no thyromegaly or thyroid nodule. No carotid bruit.  Lungs: Bibasilar crackles CV: Nondisplaced PMI. Heart regular S1/S2, no S3/S4, no murmur. Difficult ot palpate pedal pulses.  Abdomen: Soft, NT, mild/moderate distention, no HSM. No bruits or masses. +BS  Neurologic: Alert and oriented x 3.  Psych: Pleasant affect.  Extremities: No clubbing or cyanosis. 1+ ankle edema.   Assessment/Plan:  1. Chronic systolic CHF: Ischemic cardiomyopathy.  12/16 echo with EF 20-25%, diffuse hypokinesis.  NYHA class II symptoms, he is not volume overloaded.  Still some problems with medication compliance. - Continue Lasix at 60 mg daily, this dose appears adequate.   - Continue Coreg 6.25 mg bid and Entresto 24/26 bid. - I will try again to add spironolactone 12.5 mg daily with BMET/BNP in 1 week.   - Pt has refused ICD.  He would not be a CRT  candidate with narrow QRS.  2. CAD: No CP.  Last PCI 01/2013. Completed his year-long course of ticlopidine.  He was allergic to Plavix and had severe gastrocnemius bleed with Effient. - Continue ASA 81, he has restarted statin. 3. HLD: Has had myalgias with Crestor 40 mg and atorvastatin.  - He restarted Crestor 10 mg daily. Goal LDL < 70.  Check lipids once back on for several months (3/17).  4. Calf pain: Mainly on left.  This may be due to scar from prior surgical repair, but cannot rule claudication given difficulty palpating pedal pulses.  I will arrange for ABIs.   Followup in 2 months    Loralie Champagne  02/05/2016

## 2016-02-05 NOTE — Patient Instructions (Signed)
Please start Spironolactone 12.5 mg (1/2 tab) daily  Labs in 1 week  Your physician has requested that you have an ankle brachial index (ABI). During this test an ultrasound and blood pressure cuff are used to evaluate the arteries that supply the arms and legs with blood. Allow thirty minutes for this exam. There are no restrictions or special instructions.  Your physician recommends that you schedule a follow-up appointment in: 2 months

## 2016-02-11 ENCOUNTER — Ambulatory Visit (HOSPITAL_COMMUNITY)
Admission: RE | Admit: 2016-02-11 | Discharge: 2016-02-11 | Disposition: A | Discharge status: Home / Self Care | Payer: Medicare Other | Source: Ambulatory Visit | Attending: Cardiology | Admitting: Cardiology

## 2016-02-11 DIAGNOSIS — I739 Peripheral vascular disease, unspecified: Secondary | ICD-10-CM

## 2016-02-11 DIAGNOSIS — E119 Type 2 diabetes mellitus without complications: Secondary | ICD-10-CM | POA: Diagnosis not present

## 2016-02-11 DIAGNOSIS — R938 Abnormal findings on diagnostic imaging of other specified body structures: Secondary | ICD-10-CM | POA: Insufficient documentation

## 2016-02-11 DIAGNOSIS — I771 Stricture of artery: Secondary | ICD-10-CM | POA: Diagnosis not present

## 2016-02-11 DIAGNOSIS — E785 Hyperlipidemia, unspecified: Secondary | ICD-10-CM | POA: Diagnosis not present

## 2016-02-11 DIAGNOSIS — I5042 Chronic combined systolic (congestive) and diastolic (congestive) heart failure: Secondary | ICD-10-CM | POA: Diagnosis not present

## 2016-02-15 ENCOUNTER — Ambulatory Visit (HOSPITAL_COMMUNITY)
Admission: RE | Admit: 2016-02-15 | Discharge: 2016-02-15 | Disposition: A | Discharge status: Home / Self Care | Payer: Medicare Other | Source: Ambulatory Visit | Attending: Cardiology | Admitting: Cardiology

## 2016-02-15 DIAGNOSIS — I5022 Chronic systolic (congestive) heart failure: Secondary | ICD-10-CM | POA: Diagnosis not present

## 2016-02-15 LAB — BASIC METABOLIC PANEL
Anion gap: 12 (ref 5–15)
BUN: 21 mg/dL — ABNORMAL HIGH (ref 6–20)
CHLORIDE: 101 mmol/L (ref 101–111)
CO2: 27 mmol/L (ref 22–32)
CREATININE: 1.1 mg/dL (ref 0.61–1.24)
Calcium: 9.5 mg/dL (ref 8.9–10.3)
GFR calc non Af Amer: >60 mL/min (ref >60)
Glucose, Bld: 263 mg/dL — ABNORMAL HIGH (ref 65–99)
POTASSIUM: 4.5 mmol/L (ref 3.5–5.1)
SODIUM: 140 mmol/L (ref 135–145)

## 2016-02-15 LAB — BRAIN NATRIURETIC PEPTIDE: B NATRIURETIC PEPTIDE 5: 1251.1 pg/mL — AB (ref 0.0–100.0)

## 2016-04-10 ENCOUNTER — Ambulatory Visit (HOSPITAL_COMMUNITY)
Admission: RE | Admit: 2016-04-10 | Discharge: 2016-04-10 | Disposition: A | Discharge status: Home / Self Care | Payer: Medicare Other | Source: Ambulatory Visit | Attending: Cardiology | Admitting: Cardiology

## 2016-04-10 ENCOUNTER — Encounter (HOSPITAL_COMMUNITY): Payer: Self-pay

## 2016-04-10 VITALS — BP 123/66 | HR 88 | Temp 98.2°F | Resp 16 | Wt 241.4 lb

## 2016-04-10 DIAGNOSIS — Z7982 Long term (current) use of aspirin: Secondary | ICD-10-CM | POA: Diagnosis not present

## 2016-04-10 DIAGNOSIS — I110 Hypertensive heart disease with heart failure: Secondary | ICD-10-CM | POA: Insufficient documentation

## 2016-04-10 DIAGNOSIS — E785 Hyperlipidemia, unspecified: Secondary | ICD-10-CM

## 2016-04-10 DIAGNOSIS — M353 Polymyalgia rheumatica: Secondary | ICD-10-CM | POA: Insufficient documentation

## 2016-04-10 DIAGNOSIS — G4733 Obstructive sleep apnea (adult) (pediatric): Secondary | ICD-10-CM | POA: Diagnosis not present

## 2016-04-10 DIAGNOSIS — I739 Peripheral vascular disease, unspecified: Secondary | ICD-10-CM | POA: Insufficient documentation

## 2016-04-10 DIAGNOSIS — Z8546 Personal history of malignant neoplasm of prostate: Secondary | ICD-10-CM | POA: Insufficient documentation

## 2016-04-10 DIAGNOSIS — Z8249 Family history of ischemic heart disease and other diseases of the circulatory system: Secondary | ICD-10-CM | POA: Insufficient documentation

## 2016-04-10 DIAGNOSIS — I5022 Chronic systolic (congestive) heart failure: Secondary | ICD-10-CM | POA: Diagnosis not present

## 2016-04-10 DIAGNOSIS — E119 Type 2 diabetes mellitus without complications: Secondary | ICD-10-CM | POA: Insufficient documentation

## 2016-04-10 DIAGNOSIS — I2510 Atherosclerotic heart disease of native coronary artery without angina pectoris: Secondary | ICD-10-CM | POA: Diagnosis not present

## 2016-04-10 DIAGNOSIS — Z87891 Personal history of nicotine dependence: Secondary | ICD-10-CM | POA: Insufficient documentation

## 2016-04-10 DIAGNOSIS — I255 Ischemic cardiomyopathy: Secondary | ICD-10-CM | POA: Diagnosis not present

## 2016-04-10 DIAGNOSIS — Z79899 Other long term (current) drug therapy: Secondary | ICD-10-CM | POA: Insufficient documentation

## 2016-04-10 MED ORDER — LOSARTAN POTASSIUM 50 MG PO TABS: 50.0000 mg | ORAL_TABLET | Freq: Every day | ORAL | Status: DC

## 2016-04-10 MED ORDER — CARVEDILOL 12.5 MG PO TABS: 12.5000 mg | ORAL_TABLET | Freq: Two times a day (BID) | ORAL | Status: DC

## 2016-04-10 NOTE — Progress Notes (Signed)
Patient ID: TAMI TARDO, male   DOB: 1941/02/28, 75 y.o.   MRN: [id number]   Advanced Heart Failure Clinic Note   Patient ID: ROARKE DUTKIEWICZ, male   DOB: [birth year], 75 y.o.   MRN: [id number] PCP: Dr. Elease Hashimoto Cardiology: Dr. Aundra Dubin  75 yo with history of DM, HTN, CAD, and ischemic cardiomyopathy presents for cardiology followup. Patient had a CHF exacerbation in 2/12 and was found to have LV systolic dysfunction with EF around 20%. LHC showed RCA, ramus, and CFX disease. RCA was subtotally occluded. Cardiac MRI showed that all walls, including the inferior wall, should be viable. Patient therefore underwent opening of his chronic totally occluded RCA as well as PCI to the ramus in 2/12. He received drug eluting stents and was on Effient.  He had an echo in 2/13 that showed EF improved to 45% with moderate LV dilation and mild LV hypertrophy.   In 5/13, he developed a large left lower leg hematoma.  He was still on Effient at that time.  ASA and Effient were stopped.  The hematoma did not resolve.  In 7/13, he was re-admitted with septic shock from MSSA from an abscess in his left gastrocnemius.  He also grew Pseudomonas from the left gastrocnemius as well.  He had a prolonged course in the hospital and later in a rehab facility.  Ultimately, he got back home again.  I had him get an echo in 1/14, and this showed EF 15% with diffuse hypokinesis and inferoposterior akinesis.  He had been off of most of his prior cardiac medications.  I took him back for [facility] in 1/14.  This showed subtotal occlusion of a small AV LCx and 95% ostial in-stent restenosis in the RCA.  He was treated in 2/14 with a Xience DES to the ostial RCA and begun on Plavix.  Unfortunately, he developed diffuse hives after starting Plavix and had to be switched to ticlopidine.  He has tolerated ticlopidine.   Echo done in 12/14 showed some improvement in LV function but EF was still low at 30-35%.  He did not want ICD.   Presented to ED  11/14/15 with worsening SOB after a few steps and CXR with CHF and bilateral effusions in the setting of marked medical non-compliance. States he stopped taking his medicines in 01/2015 (except for ASA 81 and a multivitamin).  He was feeling good and decided that he did not need them anymore.  Also c/o URI symptoms. Diuresed over 2 L with IV diuretics in the ER and started on lasix 20 mg daily.   Echo (12/16) showed EF 20-25% with diffuse hypokinesis (down from 35% in 12/15).    He is stable symptomatically and doing pretty well overall.  Only problem has been mild swelling of the lower lip.  Unfortunately, this is concerning for angioedema.  No exertional dyspnea.  Mild bendopnea . No orthopnea/PND.  No chest pain.    Labs (11/14): LDL 88, HDL 38 Labs (2/15): BNP 85, K 4.7, creatinine 1.0 Labs (10/15): K 4, creatinine 1.1 Labs (12/15): LDL 91, HDL 29 Labs (11/16): K 5.2, creatinine 1.11, HCT 46.4 Labs (12/16): K 4.9, creatinine 1.23 Labs (1/17): K 4.7, creatinine 1.15, BNP 1433 Labs (2/17): K 4.7, creatinine 1.14, BNP 870 Labs (3/17): K 4.5, creatinine 1.10, BNP 1257  Past Medical History:  1. HYPERTENSION  2. HYPERLIPIDEMIA: Myalgias with atorvastatin and Crestor 3. ECZEMA  4. RHINITIS  5. HERPES ZOSTER OPHTHALMICUS  6. ADENOCARCINOMA, PROSTATE: Status post prostatectomy in  2009. Has had some incontinence since then.  7. Diabetes mellitus type II  8. Arthritis  9. Obesity  10. GERD: rare  11. CAD: Presented with exertional dyspnea, never had chest pain. LHC (2/12) with subtotalled proximal RCA and left to right collaterals, 90% proximal moderate-sized ramus, 95% proximal relatively small CFX, 40-50% proximal LAD. Cardiac MRI (2/12) showed EF 21%, some mild scar in basal segments but all wall segments would be expected to be viable. DES x 5 (overlapping) to RCA, DES x 1 to RI 01/30/11 .  Bled into leg with Effient use (long, complicated course).  LHC (1/14): AV LCx small with subtotal  occlusion, 95% ostial instent restenosis in RCA. PCI in 2/14 to ostial RCA with 3.5 x 15 Xience DES.  Plavix allergy (hives) so put on ticlopidine.  12. Ischemic CMP: Echo (2/12) with moderately dilated LV, EF about 20% with diffuse hypokinesis and inferior akinesis, pseudonormal diastolic function, mild MR, severe LAE, mildly decreased RV systolic function. RHC (2/12) with mean RA 12, PA 40/25, mean PCWP 26, CI 2.1.  Echo (5/12) with EF 40% (appeared worse to my eye) with posterior HK, basal inferior AK, inferoseptal AK, basal anteroseptal AK, mild MR.  Cardiac MRI was repeated and showed EF 32% (improved from 21%) and mild LV dilation (was severely dilated before) with diffuse hypokinesis and subendocardial scar in the basal inferior, basal posterior, and basal anterolateral segments.  Echo (2/13) with EF 45%, moderate LV dilation, mild LVH.  Echo (1/14) with EF 15%, diffuse hypokinesis, inferoposterior akinesis, mild MR, grade I diastolic dysfunction.  Echo (5/14) with EF 30-35%, mild LV dilation, akinesis of the basal inferior wall otherwise diffuse hypokinesis.  Echo (12/14) with EF 30-35%, mild LV dilation, diffuse hypokinesis with basal inferior and posterior akinesis. Echo (12/15) with EF 35%, mildly dilated LV, wall motion abnormalities, normal RV size and systolic function. Echo (12/16) with EF 20-25%, diffuse hypokinesis, severe LV dilation, mild MR.  13. Cervical OA.  14. Polymyalgia rheumatica 15. OSA: Severe on sleep study 10/12.  On CPAP.  16. Left lower leg hematoma in setting of Effient use.  He developed a left gastrocnemius abscess and septic shock with prolonged hospitalization beginning in 7/13.  17. PAD: Lower extremity arterial doppler evaluation in 2/17 showed occluded peroneal arteries bilaterally, ABI 1.1 (R) and 1.0 (L).    Family History:  Father died with MI at age 85. He was an alcoholic. Mother died with cancer at around 37.   Social History:  Occupation: retired Dietitian  Married, lives in [place]  Past smoker, quit around [place]: all systems reviewed and negative except as per HPI.    Current Outpatient Prescriptions  Medication Sig Dispense Refill  . aspirin EC 81 MG tablet Take 81 mg by mouth daily.    . carvedilol (COREG) 12.5 MG tablet Take 1 tablet (12.5 mg total) by mouth 2 (two) times daily. 60 tablet 6  . furosemide (LASIX) 20 MG tablet Take 3 tablets (60 mg total) by mouth daily. 90 tablet 3  . Multiple Vitamins-Minerals (CENTRUM SILVER ADULT 50+) TABS Take 1 tablet by mouth daily.    . rosuvastatin (CRESTOR) 10 MG tablet Take 10 mg by mouth daily.    Marland Kitchen spironolactone (ALDACTONE) 25 MG tablet Take 0.5 tablets (12.5 mg total) by mouth daily. 15 tablet 3  . losartan (COZAAR) 50 MG tablet Take 1 tablet (50 mg total) by mouth daily. 30 tablet 6   No current facility-administered medications for  this encounter.    BP 123/66 mmHg  Pulse 88  Temp(Src) 98.2 F (36.8 C) (Oral)  Resp 16  Wt 241 lb 6.4 oz (109.498 kg)  SpO2 97%   Wt Readings from Last 3 Encounters:  04/10/16 241 lb 6.4 oz (109.498 kg)  02/05/16 239 lb 8 oz (108.636 kg)  01/03/16 238 lb (107.956 kg)    General: NAD, obese.  Neck: Thick, JVP 7 cm, no thyromegaly or thyroid nodule. No carotid bruit.  HEENT: Mild lower lip swelling.  Lungs: Bibasilar crackles CV: Nondisplaced PMI. Heart regular S1/S2, no S3/S4, no murmur. Difficult to palpate pedal pulses.  Abdomen: Soft, NT, mild/moderate distention, no HSM. No bruits or masses. +BS  Neurologic: Alert and oriented x 3.  Psych: Pleasant affect.  Extremities: No clubbing or cyanosis. Trace ankle edema.   Assessment/Plan:  1. Chronic systolic CHF: Ischemic cardiomyopathy.  12/16 echo with EF 20-25%, diffuse hypokinesis.  NYHA class II symptoms, he is not volume overloaded.  Doing better with medication compliance.   - Continue Lasix at 60 mg daily, this dose appears adequate.  BMET today.  - Continue  spironolactone 12.5 mg daily. - Increase Coreg to 12.5 mg bid.  - I am concerned that he has angioedema related to [facility] use.  I will stop Entresto now.  In 1 week, he will start losartan 50 mg daily (low risk of getting angioedema).   - Pt has refused ICD.  He would not be a CRT candidate with narrow QRS.  2. CAD: No CP.  Last PCI 01/2013. Completed his year-long course of ticlopidine.  He was allergic to Plavix and had severe gastrocnemius bleed with Effient. - Continue ASA 81 and Crestor 10 daily.  3. HLD: Has had myalgias with Crestor 40 mg and atorvastatin.  - He restarted Crestor 10 mg daily. Goal LDL < 70.  Lipids today.   4. PAD: Occluded peroneal arteries bilaterally.  No significant claudication.   Followup in 3 months    Loralie Champagne  04/10/2016

## 2016-04-10 NOTE — Progress Notes (Signed)
Advanced Heart Failure Medication Review by a Pharmacist  Does the patient  feel that his/her medications are working for him/her?  yes  Has the patient been experiencing any side effects to the medications prescribed?  yes  Does the patient measure his/her own blood pressure or blood glucose at home?  yes   Does the patient have any problems obtaining medications due to transportation or finances?   no  Understanding of regimen: good Understanding of indications: good Potential of compliance: good Patient understands to avoid NSAIDs. Patient understands to avoid decongestants.  Issues to address at subsequent visits: None   Pharmacist comments:  Wayne [name] is a pleasant 75 yo M presenting without a medication list but with excellent recall of his regimen including dosages. He reports good compliance with his regimen but does state that when he goes out during the day he may only take 40 mg of lasix when he gets back home instead of the prescribed 60 mg. He also reported that recently his lips have started to swell slightly but does not feel that his tongue or throat have been swelling. He is on Entresto which may be the cause of this. He did not have any other medication-related questions or concerns for me at this time.   Ruta Hinds. Velva Harman, PharmD, BCPS, CPP Clinical Pharmacist Pager: [phone] Phone: [phone] 04/10/2016 9:51 AM      Time with patient: 10 minutes Preparation and documentation time: 2 minutes Total time: 12 minutes

## 2016-04-10 NOTE — Patient Instructions (Signed)
Stop [employer] Losartan 50 mg daily STARTING Thursday MAY 4TH  Increase Carvedilol 12.5 mg Twice daily, a new prescription for 12.5 mg tabs has been sent in  Labs on Mon 5/8  We will contact you in 3 months to schedule your next appointment.

## 2016-04-20 ENCOUNTER — Encounter (HOSPITAL_BASED_OUTPATIENT_CLINIC_OR_DEPARTMENT_OTHER): Payer: Self-pay

## 2016-04-20 ENCOUNTER — Emergency Department (HOSPITAL_BASED_OUTPATIENT_CLINIC_OR_DEPARTMENT_OTHER)
Admission: EM | Admit: 2016-04-20 | Discharge: 2016-04-20 | Disposition: A | Discharge status: Home / Self Care | Payer: Medicare Other | Attending: Emergency Medicine | Admitting: Emergency Medicine

## 2016-04-20 ENCOUNTER — Emergency Department (HOSPITAL_BASED_OUTPATIENT_CLINIC_OR_DEPARTMENT_OTHER): Payer: Medicare Other

## 2016-04-20 DIAGNOSIS — Z87891 Personal history of nicotine dependence: Secondary | ICD-10-CM | POA: Insufficient documentation

## 2016-04-20 DIAGNOSIS — I2510 Atherosclerotic heart disease of native coronary artery without angina pectoris: Secondary | ICD-10-CM | POA: Diagnosis not present

## 2016-04-20 DIAGNOSIS — I110 Hypertensive heart disease with heart failure: Secondary | ICD-10-CM | POA: Diagnosis not present

## 2016-04-20 DIAGNOSIS — R1031 Right lower quadrant pain: Secondary | ICD-10-CM | POA: Insufficient documentation

## 2016-04-20 DIAGNOSIS — E669 Obesity, unspecified: Secondary | ICD-10-CM | POA: Diagnosis not present

## 2016-04-20 DIAGNOSIS — I5042 Chronic combined systolic (congestive) and diastolic (congestive) heart failure: Secondary | ICD-10-CM | POA: Insufficient documentation

## 2016-04-20 DIAGNOSIS — E119 Type 2 diabetes mellitus without complications: Secondary | ICD-10-CM | POA: Insufficient documentation

## 2016-04-20 DIAGNOSIS — N281 Cyst of kidney, acquired: Secondary | ICD-10-CM | POA: Diagnosis not present

## 2016-04-20 DIAGNOSIS — E785 Hyperlipidemia, unspecified: Secondary | ICD-10-CM | POA: Diagnosis not present

## 2016-04-20 DIAGNOSIS — Z79899 Other long term (current) drug therapy: Secondary | ICD-10-CM | POA: Insufficient documentation

## 2016-04-20 DIAGNOSIS — R109 Unspecified abdominal pain: Secondary | ICD-10-CM

## 2016-04-20 DIAGNOSIS — M1990 Unspecified osteoarthritis, unspecified site: Secondary | ICD-10-CM | POA: Insufficient documentation

## 2016-04-20 LAB — CBC WITH DIFFERENTIAL/PLATELET
BASOS ABS: 0 10*3/uL (ref 0.0–0.1)
Basophils Relative: 0 %
EOS PCT: 5 %
Eosinophils Absolute: 0.5 10*3/uL (ref 0.0–0.7)
HCT: 43.6 % (ref 39.0–52.0)
HEMOGLOBIN: 14.6 g/dL (ref 13.0–17.0)
LYMPHS ABS: 1.5 10*3/uL (ref 0.7–4.0)
LYMPHS PCT: 18 %
MCH: 28.3 pg (ref 26.0–34.0)
MCHC: 33.5 g/dL (ref 30.0–36.0)
MCV: 84.7 fL (ref 78.0–100.0)
Monocytes Absolute: 0.7 10*3/uL (ref 0.1–1.0)
Monocytes Relative: 8 %
NEUTROS PCT: 69 %
Neutro Abs: 5.9 10*3/uL (ref 1.7–7.7)
PLATELETS: 172 10*3/uL (ref 150–400)
RBC: 5.15 MIL/uL (ref 4.22–5.81)
RDW: 14.2 % (ref 11.5–15.5)
WBC: 8.6 10*3/uL (ref 4.0–10.5)

## 2016-04-20 LAB — URINALYSIS, ROUTINE W REFLEX MICROSCOPIC
Bilirubin Urine: NEGATIVE
Glucose, UA: NEGATIVE mg/dL
HGB URINE DIPSTICK: NEGATIVE
Ketones, ur: NEGATIVE mg/dL
LEUKOCYTES UA: NEGATIVE
NITRITE: NEGATIVE
PROTEIN: NEGATIVE mg/dL
SPECIFIC GRAVITY, URINE: 1.008 (ref 1.005–1.030)
pH: 5.5 (ref 5.0–8.0)

## 2016-04-20 LAB — COMPREHENSIVE METABOLIC PANEL
ALK PHOS: 63 U/L (ref 38–126)
ALT: 15 U/L — AB (ref 17–63)
AST: 20 U/L (ref 15–41)
Albumin: 3.8 g/dL (ref 3.5–5.0)
Anion gap: 6 (ref 5–15)
BUN: 23 mg/dL — AB (ref 6–20)
CALCIUM: 8.9 mg/dL (ref 8.9–10.3)
CHLORIDE: 106 mmol/L (ref 101–111)
CO2: 23 mmol/L (ref 22–32)
CREATININE: 1.24 mg/dL (ref 0.61–1.24)
GFR calc Af Amer: >60 mL/min (ref >60)
GFR, EST NON AFRICAN AMERICAN: 56 mL/min — AB (ref >60)
Glucose, Bld: 199 mg/dL — ABNORMAL HIGH (ref 65–99)
Potassium: 3.8 mmol/L (ref 3.5–5.1)
SODIUM: 135 mmol/L (ref 135–145)
Total Bilirubin: 0.7 mg/dL (ref 0.3–1.2)
Total Protein: 6.8 g/dL (ref 6.5–8.1)

## 2016-04-20 LAB — LIPASE, BLOOD: Lipase: 30 U/L (ref 11–51)

## 2016-04-20 NOTE — ED Provider Notes (Signed)
CSN: [id number]     Arrival date & time 04/20/16  1040 History   First MD Initiated Contact with Patient 04/20/16 1054     Chief Complaint  Patient presents with  . Groin Pain     (Consider location/radiation/quality/duration/timing/severity/associated sxs/prior Treatment) HPI Comments: 75 year old male with history of diabetes mellitus, hyperlipidemia, hypertension, coronary artery disease presents for right flank pain. The patient reports he's been having pain in his right flank/lower back has been radiating into his groin. This is been going on over the last day or 2. He denies any nausea or vomiting. He reports normal urination. He has a history of kidney stones but says this feels different. He also has a history of a right inguinal hernia repair. He denies swelling of his scrotum but does report the pain feels like it radiates down into his scrotum. No fevers or chills. Normal bowel movements. Denies chest pain or shortness of breath. 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   Past Medical History  Diagnosis Date  . Diabetes mellitus, type 2 ([place])   . HYPERLIPIDEMIA     intolerant to Lipitor (myalgias)  . HYPERTENSION   . CORONARY ATHEROSCLEROSIS NATIVE CORONARY ARTERY     a. 01/2011 Cath/PCI: LM nl, LAD 40-50p, D1 80-small, LCX 95-small, RI 90, RCA 100, EF 20%;  b. 01/2011 Card MRI - No transmural scar;  c. 01/2011 PCI RCA->5 Promus DES, RI->3.0x16 Promus DES; d. Cath 01/13/13 patent LAD & Ramus, diffuse LCx dz, RCA mult overlapping stents w/ 95% osital stenosis, EF 20%, s/p DES to ostial/prox RCA 01/24/13   . Herpes zoster ophthalmicus   . Arthritis   . Obesity   . GERD (gastroesophageal reflux disease)   . Ischemic cardiomyopathy     a. 01/2012 Echo EF 45%, mild LVH; b. [id number], grade 1 diastolic dysfunction, diffuse hypokinesis, inferoposterior akinesis   . Adenocarcinoma of prostate ([place])     s/p seed implants   . Polymyalgia rheumatica ([place])   . Noncompliance   . Hematoma of leg     a. left leg hematoma 03/2012 in the setting of asa/effient  . Cellulitis of left leg     a. 0000000 complicated by septic shock  . OSA (obstructive sleep apnea)   . Chronic combined systolic and diastolic CHF (congestive heart failure) ([place])   . Colon polyps    Past Surgical History  Procedure Laterality Date  . Prostate surgery      cancer, seed implant  . Coronary stent placement  2012    reports 6 stents placed  . I&d extremity  06/15/2012    Procedure: IRRIGATION AND DEBRIDEMENT EXTREMITY;  Surgeon: Newt Minion, MD;  Location: [facility];  Service: Orthopedics;  Laterality: Left;  I&D Left Posterior Knee  . I&d extremity  06/30/2012    Procedure: IRRIGATION AND DEBRIDEMENT EXTREMITY;  Surgeon: Newt Minion, MD;  Location: [place];  Service: Orthopedics;  Laterality: Left;  Left Leg Irrigation and Debridement and placement of Wound VAC and application of  A-cell  . I&d extremity  07/20/2012    Procedure: IRRIGATION AND DEBRIDEMENT EXTREMITY;  Surgeon: Newt Minion, MD;  Location: [facility];  Service: Orthopedics;  Laterality: Left;  Irrigation and Debridement Left Leg and Place antibiotic beads   . Cardiac catheterization  01/24/2013  . Coronary angioplasty with stent placement  01/24/2013    DES to RCA  . Percutaneous  coronary stent intervention (pci-s) N/A 01/24/2013    Procedure: PERCUTANEOUS CORONARY STENT INTERVENTION (PCI-S);  Surgeon: Wellington Hampshire, MD;  Location: [facility] CATH LAB;  Service: Cardiovascular;  Laterality: N/A;   Family History  Problem Relation Age of Onset  . Cancer Mother 16    unknown CA  . Heart disease Father   . Alcohol abuse Father   . Heart attack Father 6   Social History  Substance Use Topics  . Smoking status: Former Smoker -- 0.30 packs/day for 20 years    Types: Cigarettes    Quit date: 02/23/1989  . Smokeless tobacco: Never Used  . Alcohol Use: No    Review of Systems   Constitutional: Negative for fever, chills and fatigue.  HENT: Negative for congestion, postnasal drip and rhinorrhea.   Eyes: Negative for visual disturbance.  Respiratory: Negative for cough, chest tightness and shortness of breath.   Cardiovascular: Negative for chest pain and palpitations.  Gastrointestinal: Positive for abdominal pain (right lower quadrant/groin). Negative for nausea, vomiting, diarrhea and constipation.  Genitourinary: Positive for flank pain (right sided). Negative for dysuria, urgency, frequency, hematuria, discharge, penile swelling, scrotal swelling and penile pain.  Musculoskeletal: Positive for back pain (mild, right flank). Negative for myalgias.  Skin: Negative for rash.  Neurological: Negative for dizziness, weakness and headaches.  Hematological: Does not bruise/bleed easily.      Allergies  Plavix; Lipitor; Entresto; and Ancef  Home Medications   Prior to Admission medications   Medication Sig Start Date End Date Taking? Authorizing Provider  aspirin EC 81 MG tablet Take 81 mg by mouth daily.    Historical Provider, MD  carvedilol (COREG) 12.5 MG tablet Take 1 tablet (12.5 mg total) by mouth 2 (two) times daily. 04/10/16   Larey Dresser, MD  furosemide (LASIX) 20 MG tablet Take 3 tablets (60 mg total) by mouth daily. 01/03/16   Larey Dresser, MD  losartan (COZAAR) 50 MG tablet Take 1 tablet (50 mg total) by mouth daily. 04/10/16   Larey Dresser, MD  Multiple Vitamins-Minerals (CENTRUM SILVER ADULT 50+) TABS Take 1 tablet by mouth daily.    Historical Provider, MD  rosuvastatin (CRESTOR) 10 MG tablet Take 10 mg by mouth daily.    Historical Provider, MD  spironolactone (ALDACTONE) 25 MG tablet Take 0.5 tablets (12.5 mg total) by mouth daily. 01/03/16   Larey Dresser, MD   BP 122/79 mmHg  Pulse 98  Temp(Src) 98.2 F (36.8 C) (Oral)  Resp 18  Ht 5\' 8"  (1.727 m)  Wt 234 lb (106.142 kg)  BMI 35.59 kg/m2  SpO2 98% Physical Exam   Constitutional: He is oriented to person, place, and time. He appears well-developed and well-nourished. No distress.  HENT:  Head: Normocephalic and atraumatic.  Right Ear: External ear normal.  Left Ear: External ear normal.  Mouth/Throat: Oropharynx is clear and moist. No oropharyngeal exudate.  Eyes: EOM are normal. Pupils are equal, round, and reactive to light.  Neck: Normal range of motion. Neck supple.  Cardiovascular: Normal rate, regular rhythm and intact distal pulses.   Pulmonary/Chest: Effort normal. No respiratory distress. He has no wheezes. He has no rales.  Abdominal: Soft. He exhibits distension. He exhibits no mass. There is tenderness (right sided). There is no guarding. Hernia confirmed negative in the right inguinal area and confirmed negative in the left inguinal area.  Genitourinary: Testes normal and penis normal. Right testis shows no mass, no swelling and no tenderness. Left testis shows no  mass, no swelling and no tenderness.  Musculoskeletal: He exhibits no edema.  Neurological: He is alert and oriented to person, place, and time.  Skin: Skin is warm and dry. No rash noted. He is not diaphoretic.  Vitals reviewed.   ED Course  Procedures (including critical care time) Labs Review Labs Reviewed  COMPREHENSIVE METABOLIC PANEL - Abnormal; Notable for the following:    Glucose, Bld 199 (*)    BUN 23 (*)    ALT 15 (*)    GFR calc non Af Amer 56 (*)    All other components within normal limits  URINALYSIS, ROUTINE W REFLEX MICROSCOPIC (NOT AT [facility])  CBC WITH DIFFERENTIAL/PLATELET  LIPASE, BLOOD    Imaging Review Ct Abdomen Pelvis Wo Contrast  04/20/2016  CLINICAL DATA:  RIGHT flank pain radiating to RIGHT groin for 3 days worse today, history prostate cancer, CHF, diabetes mellitus, hypertension, ischemic cardiomyopathy, prostate cancer, question hernia versus kidney stone EXAM: CT ABDOMEN AND PELVIS WITHOUT CONTRAST TECHNIQUE: Multidetector CT imaging of  the abdomen and pelvis was performed following the standard protocol without IV contrast. Sagittal and coronal MPR images reconstructed from axial data set. Patient drank dilute oral contrast. COMPARISON:  None FINDINGS: Lower chest: Small bibasilar pleural effusions and dependent atelectasis. Minimal pericardial fluid. Hepatobiliary: Dependent calculi within gallbladder. No gallbladder wall thickening or biliary dilatation. Unremarkable liver. Pancreas: Normal appearance Spleen: Normal appearance Adrenals/Urinary Tract: Exophytic cyst inferior pole RIGHT kidney 2.4 x 2.6 cm image 49. No urinary tract calcification, hydronephrosis or ureteral dilatation. Unremarkable bladder and ureters. Brachytherapy seed implants in prostate bed. Unremarkable adrenal glands bilaterally. Stomach/Bowel: Normal appendix. Stomach and bowel loops normal appearance. Vascular/Lymphatic: Scattered atherosclerotic calcifications aorta and coronary arteries. Question prior coronary arterial stenting. No adenopathy. Reproductive: No mass or other significant abnormality. Other: No mass, free fluid, free air, or acute inflammatory process. Small BILATERAL inguinal hernias containing fat. Musculoskeletal: Mild degenerative disc and facet disease changes lumbar spine. Osseous demineralization. IMPRESSION: Small bibasilar pleural effusions atelectasis. Cholelithiasis without evidence of cholecystitis. Small RIGHT renal cyst. Small BILATERAL inguinal hernias containing fat. No acute abnormalities. Electronically Signed   By: Lavonia Dana M.D.   On: 04/20/2016 13:04   I have personally reviewed and evaluated these images and lab results as part of my medical decision-making.   EKG Interpretation None      MDM  Patient was seen and evaluated in stable condition. Laboratory results were unremarkable. CT abdomen and pelvis with small bilateral inguinal hernias containing fat but no sign of obstruction. No acute finding on CT. Patient felt  better on reevaluation and was able to tolerate by mouth. Possibly this is all secondary to a strained muscle in his back from the manual labor he was doing but unclear etiology of the symptoms at this time. Patient was discharged home in stable condition with strict return precautions and instruction of follow-up with his primary care physician. Final diagnoses:  Groin pain, right  Flank pain    1. Right-sided abdominal flank pain    Harvel Quale, MD 04/20/16 1614

## 2016-04-20 NOTE — ED Notes (Signed)
DC instructions reviewed with pt and wife. Opportunity for questions provided

## 2016-04-20 NOTE — ED Notes (Signed)
Pt reports right lower back pain, radiating into groin. Reports hx of kidney stones but sts "this feels different." Denies n/v.

## 2016-04-20 NOTE — Discharge Instructions (Signed)
You were seen and evaluated today for your back pain and groin pain. The exact cause is unclear at this time. 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  Follow-up with your primary care physician. Rest. Return with sudden onset of worsening symptoms or new concerning symptoms.  Back Pain, Adult Back pain is very common in adults.The cause of back pain is rarely dangerous and the pain often gets better over time.The cause of your back pain may not be known. Some common causes of back pain include:  Strain of the muscles or ligaments supporting the spine.  Wear and tear (degeneration) of the spinal disks.  Arthritis.  Direct injury to the back. For many people, back pain may return. Since back pain is rarely dangerous, most people can learn to manage this condition on their own. HOME CARE INSTRUCTIONS Watch your back pain for any changes. The following actions may help to lessen any discomfort you are feeling:  Remain active. It is stressful on your back to sit or stand in one place for long periods of time. Do not sit, drive, or stand in one place for more than 30 minutes at a time. Take short walks on even surfaces as soon as you are able.Try to increase the length of time you walk each day.  Exercise regularly as directed by your health care provider. Exercise helps your back heal faster. It also helps avoid future injury by keeping your muscles strong and flexible.  Do not stay in bed.Resting more than 1-2 days can delay your recovery.  Pay attention to your body when you bend and lift. The most comfortable positions are those that put less stress on your recovering back. Always use proper lifting techniques, including:  Bending your knees.  Keeping the load close to your body.  Avoiding twisting.  Find a comfortable position to sleep. Use a firm mattress and lie on your side with your knees slightly bent. If you lie on your  back, put a pillow under your knees.  Avoid feeling anxious or stressed.Stress increases muscle tension and can worsen back pain.It is important to recognize when you are anxious or stressed and learn ways to manage it, such as with exercise.  Take medicines only as directed by your health care provider. Over-the-counter medicines to reduce pain and inflammation are often the most helpful.Your health care provider may prescribe muscle relaxant drugs.These medicines help dull your pain so you can more quickly return to your normal activities and healthy exercise.  Apply ice to the injured area:  Put ice in a plastic bag.  Place a towel between your skin and the bag.  Leave the ice on for 20 minutes, 2-3 times a day for the first 2-3 days. After that, ice and heat may be alternated to reduce pain and spasms.  Maintain a healthy weight. Excess weight puts extra stress on your back and makes it difficult to maintain good posture. SEEK MEDICAL CARE IF:  You have pain that is not relieved with rest or medicine.  You have increasing pain going down into the legs or buttocks.  You have pain that does not improve in one week.  You have night pain.  You lose weight.  You have a fever or chills. SEEK IMMEDIATE MEDICAL CARE IF:   You develop new bowel or bladder control problems.  You have unusual weakness or numbness in your arms or legs.  You develop nausea or  vomiting.  You develop abdominal pain.  You feel faint.   This information is not intended to replace advice given to you by your health care provider. Make sure you discuss any questions you have with your health care provider.   Document Released: 12/01/2005 Document Revised: 12/22/2014 Document Reviewed: 04/04/2014 Elsevier Interactive Patient Education 2016 Elsevier Inc[employer]  Abdominal Pain, Adult Many things can cause abdominal pain. Usually, abdominal pain is not caused by a disease and will improve without treatment.  It can often be observed and treated at home. Your health care provider will do a physical exam and possibly order blood tests and X-rays to help determine the seriousness of your pain. However, in many cases, more time must pass before a clear cause of the pain can be found. Before that point, your health care provider may not know if you need more testing or further treatment. HOME CARE INSTRUCTIONS Monitor your abdominal pain for any changes. The following actions may help to alleviate any discomfort you are experiencing:  Only take over-the-counter or prescription medicines as directed by your health care provider.  Do not take laxatives unless directed to do so by your health care provider.  Try a clear liquid diet (broth, tea, or water) as directed by your health care provider. Slowly move to a bland diet as tolerated. SEEK MEDICAL CARE IF:  You have unexplained abdominal pain.  You have abdominal pain associated with nausea or diarrhea.  You have pain when you urinate or have a bowel movement.  You experience abdominal pain that wakes you in the night.  You have abdominal pain that is worsened or improved by eating food.  You have abdominal pain that is worsened with eating fatty foods.  You have a fever. SEEK IMMEDIATE MEDICAL CARE IF:  Your pain does not go away within 2 hours.  You keep throwing up (vomiting).  Your pain is felt only in portions of the abdomen, such as the right side or the left lower portion of the abdomen.  You pass bloody or black tarry stools. MAKE SURE YOU:  Understand these instructions.  Will watch your condition.  Will get help right away if you are not doing well or get worse.   This information is not intended to replace advice given to you by your health care provider. Make sure you discuss any questions you have with your health care provider.   Document Released: 09/10/2005 Document Revised: 08/22/2015 Document Reviewed:  08/10/2013 Elsevier Interactive Patient Education [employer].

## 2016-04-21 ENCOUNTER — Ambulatory Visit (HOSPITAL_COMMUNITY)
Admission: RE | Admit: 2016-04-21 | Discharge: 2016-04-21 | Disposition: A | Discharge status: Home / Self Care | Payer: Medicare Other | Source: Ambulatory Visit | Attending: Cardiology | Admitting: Cardiology

## 2016-04-21 DIAGNOSIS — E785 Hyperlipidemia, unspecified: Secondary | ICD-10-CM | POA: Diagnosis not present

## 2016-04-21 DIAGNOSIS — I5022 Chronic systolic (congestive) heart failure: Secondary | ICD-10-CM | POA: Diagnosis not present

## 2016-04-21 LAB — LIPID PANEL
CHOL/HDL RATIO: 6.3 ratio
CHOLESTEROL: 207 mg/dL — AB (ref 0–200)
HDL: 33 mg/dL — ABNORMAL LOW (ref >40)
LDL Cholesterol: 138 mg/dL — ABNORMAL HIGH (ref 0–99)
Triglycerides: 180 mg/dL — ABNORMAL HIGH (ref <150)
VLDL: 36 mg/dL (ref 0–40)

## 2016-04-21 LAB — BASIC METABOLIC PANEL
Anion gap: 10 (ref 5–15)
BUN: 21 mg/dL — AB (ref 6–20)
CHLORIDE: 106 mmol/L (ref 101–111)
CO2: 24 mmol/L (ref 22–32)
Calcium: 9.3 mg/dL (ref 8.9–10.3)
Creatinine, Ser: 1.21 mg/dL (ref 0.61–1.24)
GFR calc Af Amer: >60 mL/min (ref >60)
GFR calc non Af Amer: 58 mL/min — ABNORMAL LOW (ref >60)
GLUCOSE: 204 mg/dL — AB (ref 65–99)
POTASSIUM: 4.2 mmol/L (ref 3.5–5.1)
SODIUM: 140 mmol/L (ref 135–145)

## 2016-04-21 LAB — BRAIN NATRIURETIC PEPTIDE: B NATRIURETIC PEPTIDE 5: 817.8 pg/mL — AB (ref 0.0–100.0)

## 2016-04-29 ENCOUNTER — Telehealth (HOSPITAL_COMMUNITY): Payer: Self-pay | Admitting: *Deleted

## 2016-04-29 MED ORDER — ROSUVASTATIN CALCIUM 20 MG PO TABS: 20.0000 mg | ORAL_TABLET | Freq: Every day | ORAL | Status: DC

## 2016-04-29 NOTE — Telephone Encounter (Signed)
Notes Recorded by Scarlette Calico, RN on 04/29/2016 at 4:02 PM Spoke w/pt, he is aware and states he is taking Crestor 10 mg daily, he will increase to 20 mg daily, new rx sent in, we will contact him in 2 months to recheck

## 2016-04-29 NOTE — Telephone Encounter (Signed)
-----   Message from Larey Dresser, MD sent at 04/21/2016 10:25 AM EDT ----- Make sure he is taking Crestor.  If not, let him know that with his elevated LDL he is more likely to have a heart attack and needs to restart Crestor.  If he is taking Crestor, increase to 20 mg daily with lipids/LFTs in 2 months.

## 2016-05-19 ENCOUNTER — Other Ambulatory Visit (HOSPITAL_COMMUNITY): Payer: Self-pay | Admitting: *Deleted

## 2016-05-19 MED ORDER — SPIRONOLACTONE 25 MG PO TABS: 12.5000 mg | ORAL_TABLET | Freq: Every day | ORAL | Status: DC

## 2016-06-11 ENCOUNTER — Telehealth: Payer: Self-pay | Admitting: Cardiology

## 2016-06-11 NOTE — Telephone Encounter (Signed)
Pt c/o Shortness Of Breath: STAT if SOB developed within the last 24 hours or pt is noticeably SOB on the phone ° °1. Are you currently SOB (can you hear that pt is SOB on the phone)? no °2. How long have you been experiencing SOB?  week °3. Are you SOB when sitting or when up moving around?  both °4. Are you currently experiencing any other symptoms?  no ° °

## 2016-06-23 ENCOUNTER — Encounter (HOSPITAL_COMMUNITY): Payer: Self-pay | Admitting: *Deleted

## 2016-06-30 ENCOUNTER — Telehealth (HOSPITAL_COMMUNITY): Payer: Self-pay | Admitting: Vascular Surgery

## 2016-06-30 NOTE — Telephone Encounter (Signed)
Returned pt call to make lab appt

## 2016-07-07 ENCOUNTER — Other Ambulatory Visit (HOSPITAL_COMMUNITY): Payer: Medicare Other

## 2016-07-08 ENCOUNTER — Ambulatory Visit (HOSPITAL_COMMUNITY)
Admission: RE | Admit: 2016-07-08 | Discharge: 2016-07-08 | Disposition: A | Discharge status: Home / Self Care | Payer: Medicare Other | Source: Ambulatory Visit | Attending: Cardiology | Admitting: Cardiology

## 2016-07-08 DIAGNOSIS — E785 Hyperlipidemia, unspecified: Secondary | ICD-10-CM | POA: Insufficient documentation

## 2016-07-08 LAB — LIPID PANEL
Cholesterol: 233 mg/dL — ABNORMAL HIGH (ref 0–200)
HDL: 40 mg/dL — AB (ref >40)
LDL CALC: 149 mg/dL — AB (ref 0–99)
TRIGLYCERIDES: 218 mg/dL — AB (ref <150)
Total CHOL/HDL Ratio: 5.8 RATIO
VLDL: 44 mg/dL — ABNORMAL HIGH (ref 0–40)

## 2016-07-08 LAB — HEPATIC FUNCTION PANEL
ALT: 17 U/L (ref 17–63)
AST: 18 U/L (ref 15–41)
Albumin: 3.8 g/dL (ref 3.5–5.0)
Alkaline Phosphatase: 71 U/L (ref 38–126)
BILIRUBIN DIRECT: 0.1 mg/dL (ref 0.1–0.5)
BILIRUBIN INDIRECT: 1.1 mg/dL — AB (ref 0.3–0.9)
BILIRUBIN TOTAL: 1.2 mg/dL (ref 0.3–1.2)
Total Protein: 6.6 g/dL (ref 6.5–8.1)

## 2016-07-11 ENCOUNTER — Other Ambulatory Visit (HOSPITAL_COMMUNITY): Payer: Medicare Other

## 2016-07-14 ENCOUNTER — Ambulatory Visit (INDEPENDENT_AMBULATORY_CARE_PROVIDER_SITE_OTHER): Payer: Medicare Other | Admitting: Family Medicine

## 2016-07-14 ENCOUNTER — Encounter: Payer: Self-pay | Admitting: Family Medicine

## 2016-07-14 ENCOUNTER — Telehealth (HOSPITAL_COMMUNITY): Payer: Self-pay | Admitting: Vascular Surgery

## 2016-07-14 VITALS — BP 100/80 | HR 90 | Temp 98.0°F | Ht 68.0 in | Wt 244.6 lb

## 2016-07-14 DIAGNOSIS — I2510 Atherosclerotic heart disease of native coronary artery without angina pectoris: Secondary | ICD-10-CM | POA: Diagnosis not present

## 2016-07-14 DIAGNOSIS — IMO0002 Reserved for concepts with insufficient information to code with codable children: Secondary | ICD-10-CM

## 2016-07-14 DIAGNOSIS — R739 Hyperglycemia, unspecified: Secondary | ICD-10-CM

## 2016-07-14 DIAGNOSIS — E1365 Other specified diabetes mellitus with hyperglycemia: Secondary | ICD-10-CM

## 2016-07-14 DIAGNOSIS — R5383 Other fatigue: Secondary | ICD-10-CM

## 2016-07-14 DIAGNOSIS — E1351 Other specified diabetes mellitus with diabetic peripheral angiopathy without gangrene: Secondary | ICD-10-CM

## 2016-07-14 DIAGNOSIS — R635 Abnormal weight gain: Secondary | ICD-10-CM

## 2016-07-14 DIAGNOSIS — R6881 Early satiety: Secondary | ICD-10-CM | POA: Diagnosis not present

## 2016-07-14 LAB — TSH: TSH: 1.82 u[IU]/mL (ref 0.35–4.50)

## 2016-07-14 LAB — HEMOGLOBIN A1C: Hgb A1c MFr Bld: 8.7 % — ABNORMAL HIGH (ref 4.6–6.5)

## 2016-07-14 NOTE — Progress Notes (Signed)
Subjective:     Patient ID: Wayne [name], male   DOB: [birth year], 75 y.o.   MRN: [id number]  HPI   Patient seen with fatigue, weight gain, and early satiety past several weeks.  In looking back over his records, his weight is up 10 pounds from 2 years ago. He states that he frequently "feels full" after eating. No nausea or vomiting. No abdominal pain.  Good appetite.  He does have long-standing history of diabetes with very poor compliance with follow-up. He has not had any recent A1c. We have not seen him in over 2 years.  Recent labs have revealed several elevated blood sugars including glucoses of 204, 199, 263, 361, and 274. He does not monitor blood sugars at home. No polyuria or polydipsia. No recent GERD symptoms.  Complains of general fatigue. No chest pains. No dyspnea.  Past Medical History:  Diagnosis Date  . Adenocarcinoma of prostate ([place])    s/p seed implants  . Arthritis   . Cellulitis of left leg    a. 0000000 complicated by septic shock  . Chronic combined systolic and diastolic CHF (congestive heart failure) ([place])   . Colon polyps   . CORONARY ATHEROSCLEROSIS NATIVE CORONARY ARTERY    a. 01/2011 Cath/PCI: LM nl, LAD 40-50p, D1 80-small, LCX 95-small, RI 90, RCA 100, EF 20%;  b. 01/2011 Card MRI - No transmural scar;  c. 01/2011 PCI RCA->5 Promus DES, RI->3.0x16 Promus DES; d. Cath 01/13/13 patent LAD & Ramus, diffuse LCx dz, RCA mult overlapping stents w/ 95% osital stenosis, EF 20%, s/p DES to ostial/prox RCA 01/24/13   . Diabetes mellitus, type 2 ([place])   . GERD (gastroesophageal reflux disease)   . Hematoma of leg    a. left leg hematoma 03/2012 in the setting of asa/effient  . Herpes zoster ophthalmicus   . HYPERLIPIDEMIA    intolerant to Lipitor (myalgias)  . HYPERTENSION   . Ischemic cardiomyopathy    a. 01/2012 Echo EF 45%, mild LVH; b. A999333, grade 1 diastolic dysfunction, diffuse hypokinesis, inferoposterior akinesis   . Noncompliance   . Obesity   . OSA (obstructive  sleep apnea)   . Polymyalgia rheumatica ([facility])    Past Surgical History:  Procedure Laterality Date  . CARDIAC CATHETERIZATION  01/24/2013  . CORONARY ANGIOPLASTY WITH STENT PLACEMENT  01/24/2013   DES to RCA  . CORONARY STENT PLACEMENT  2012   reports 6 stents placed  . I&D EXTREMITY  06/15/2012   Procedure: IRRIGATION AND DEBRIDEMENT EXTREMITY;  Surgeon: Newt Minion, MD;  Location: [place];  Service: Orthopedics;  Laterality: Left;  I&D Left Posterior Knee  . I&D EXTREMITY  06/30/2012   Procedure: IRRIGATION AND DEBRIDEMENT EXTREMITY;  Surgeon: Newt Minion, MD;  Location: [place];  Service: Orthopedics;  Laterality: Left;  Left Leg Irrigation and Debridement and placement of Wound VAC and application of  A-cell  . I&D EXTREMITY  07/20/2012   Procedure: IRRIGATION AND DEBRIDEMENT EXTREMITY;  Surgeon: Newt Minion, MD;  Location: [place];  Service: Orthopedics;  Laterality: Left;  Irrigation and Debridement Left Leg and Place antibiotic beads   . PERCUTANEOUS CORONARY STENT INTERVENTION (PCI-S) N/A 01/24/2013   Procedure: PERCUTANEOUS CORONARY STENT INTERVENTION (PCI-S);  Surgeon: Wellington Hampshire, MD;  Location: [facility] CATH LAB;  Service: Cardiovascular;  Laterality: N/A;  . PROSTATE SURGERY     cancer, seed implant    reports that he quit smoking about 27 years ago. His smoking use included Cigarettes. He  has a 6.00 pack-year smoking history. He has never used smokeless tobacco. He reports that he does not drink alcohol or use drugs. family history includes Alcohol abuse in his father; Cancer (age of onset: 28) in his mother; Heart attack (age of onset: 74) in his father; Heart disease in his father. Allergies  Allergen Reactions  . Plavix [Clopidogrel]     Nose bleeds, swellings, whelps on legs & back, itching  . Lipitor [Atorvastatin] Other (See Comments)    REACTION: sore legs  . Crestor [Rosuvastatin Calcium] Other (See Comments)    Myalgias, Interfering with Gait  . Entresto  [Sacubitril-Valsartan]     Angioedema  . Ancef [Cefazolin] Rash     Review of Systems  Constitutional: Positive for fatigue and unexpected weight change. Negative for activity change and appetite change.  Eyes: Negative for visual disturbance.  Respiratory: Negative for cough, chest tightness and shortness of breath.   Cardiovascular: Negative for chest pain, palpitations and leg swelling.  Endocrine: Negative for cold intolerance, polydipsia and polyuria.  Genitourinary: Negative for dysuria.  Neurological: Negative for dizziness, syncope, weakness, light-headedness and headaches.       Objective:   Physical Exam  Constitutional: He appears well-developed and well-nourished.  HENT:  Right Ear: External ear normal.  Left Ear: External ear normal.  Neck: Neck supple. No thyromegaly present.  Cardiovascular: Normal rate and regular rhythm.   Pulmonary/Chest: Effort normal and breath sounds normal. No respiratory distress. He has no wheezes. He has no rales.  Musculoskeletal: He exhibits no edema.  Lymphadenopathy:    He has no cervical adenopathy.  Psychiatric: He has a normal mood and affect. His behavior is normal.       Assessment:     #1 type 2 diabetes. History of poor compliance with follow-up and poor compliance with diet  #2 recent weight gain, fatigue, and early satiety. At risk for gastroparesis with history of diabetes    Plan:     -Needs to lose some weight. We discussed possible referral to certified diabetes educator/nutritionist but he declines at this time. -Check further labs with hemoglobin A1c and TSH -Consider gastric imaging study to rule out gastroparesis -Avoid high fat meals and overeating per meal  [name] MD  Primary Care at [facility]

## 2016-07-14 NOTE — Telephone Encounter (Signed)
Pt received a call from office he was returning call.. Please advise

## 2016-07-14 NOTE — Patient Instructions (Signed)
Try to lose some weight We will call you with labs Will consider X-rays to rule out gastroparesis if labs normal.

## 2016-07-14 NOTE — Progress Notes (Signed)
Pre visit review using our clinic review tool, if applicable. No additional management support is needed unless otherwise documented below in the visit note. 

## 2016-07-17 ENCOUNTER — Telehealth (HOSPITAL_COMMUNITY): Payer: Self-pay | Admitting: *Deleted

## 2016-07-17 NOTE — Telephone Encounter (Signed)
Notes Recorded by Larey Dresser, MD on 07/16/2016 at 10:49 PM EDT Would refer him to lipid clinic at [facility] for evaluation to get Repatha. This medication really does not have side effects so he should tolerate it fine. While you are discussing this, please make sure he is taking his other meds. He has a tendency to stop things. ------  Notes Recorded by Harvie Junior, CMA on 07/16/2016 at 3:30 PM EDT Pt said he can not tolerate Crestor or Lipitor. Pt stated he cramps and aches to the point he can not walk. Please advise ------  Notes Recorded by Harvie Junior, [place] on 07/15/2016 at 4:48 PM EDT Attempted to reach patient again with lab results. Again no answer left voice message will try patient again tomorrow. ------  Notes Recorded by Harvie Junior, CMA on 07/14/2016 at 2:06 PM EDT Left vm for pt to call back for lab results. ------  Notes Recorded by Larey Dresser, MD on 07/12/2016 at 10:18 PM EDT I suspect he has stopped his Crestor again. Please tell him that he really needs to start back on Crestor 10 mg daily or his risk of another heart attack will significantly increase. Another MI may be fatal. If he says he is taking the Crestor, increase it to 20 mg daily.

## 2016-07-17 NOTE — Telephone Encounter (Signed)
sch pt appt w/lipid clinic on Tue 8/15 at 10 am, left pt detailed mess with date/time.

## 2016-07-17 NOTE — Telephone Encounter (Signed)
Opened in error

## 2016-07-17 NOTE — Telephone Encounter (Addendum)
Spoke w/Wayne [name] today to f/u on Dr Claris Gladden recommendations, Wayne [name] is agreeable to see [facility], referral placed.  Asked Wayne [name] about other meds and he states he did stop his Lasix "a day or so ago" because he has been feeling bad.  Discussed symptoms with Wayne [name], he states occasionally he is feeling SOB and feeling very fatigued and run down, he is not sure how long this has been going on.  He denies edema.  Have discussed w/Wayne [name] importance of taking all meds as prescribed and that symptoms could be from HF worsening not necessarily SE of meds.  Have sch an appt for Wayne [name] to see [place], [place] Tue 8/8 for eval and to discuss symptoms further, Wayne [name] is agreeable to restart Lasix

## 2016-07-22 ENCOUNTER — Encounter (HOSPITAL_COMMUNITY): Payer: Medicare Other

## 2016-07-22 ENCOUNTER — Other Ambulatory Visit: Payer: Self-pay | Admitting: Cardiology

## 2016-07-22 DIAGNOSIS — I5022 Chronic systolic (congestive) heart failure: Secondary | ICD-10-CM

## 2016-07-29 ENCOUNTER — Ambulatory Visit (INDEPENDENT_AMBULATORY_CARE_PROVIDER_SITE_OTHER): Payer: Medicare Other | Admitting: Pharmacist

## 2016-07-29 ENCOUNTER — Encounter (INDEPENDENT_AMBULATORY_CARE_PROVIDER_SITE_OTHER): Payer: Self-pay

## 2016-07-29 VITALS — BP 118/74 | HR 97

## 2016-07-29 DIAGNOSIS — E785 Hyperlipidemia, unspecified: Secondary | ICD-10-CM

## 2016-07-29 DIAGNOSIS — I2510 Atherosclerotic heart disease of native coronary artery without angina pectoris: Secondary | ICD-10-CM | POA: Diagnosis not present

## 2016-07-29 DIAGNOSIS — I5022 Chronic systolic (congestive) heart failure: Secondary | ICD-10-CM

## 2016-07-29 MED ORDER — METOPROLOL SUCCINATE ER 25 MG PO TB24: 25.0000 mg | ORAL_TABLET | Freq: Every day | ORAL | 6 refills | Status: DC

## 2016-07-29 MED ORDER — ROSUVASTATIN CALCIUM 5 MG PO TABS: 5.0000 mg | ORAL_TABLET | Freq: Every day | ORAL | 6 refills | Status: DC

## 2016-07-29 NOTE — Patient Instructions (Signed)
1.  Start Crestor 5mg  once daily.  If you start having problems with muscle pains, please call Gay Filler at [phone].   2.  Start metoprolol 25mg  once daily.    3.  We will recheck your blood pressure in 2 weeks.  If your blood pressure is good we will add back the losartan.

## 2016-07-29 NOTE — Progress Notes (Signed)
Patient ID: Wayne [name]                 DOB: 1941-02-18                    MRN: [id number]     HPI: Wayne [name] is a 75 y.o. male patient referred to lipid clinic by Dr. Aundra Dubin. Patient has PMH significant for DM, HTN, HLD, CHF with EF 20-25%, CAD, and ischemic cardiomyopathy s/p RCA mult overlapping stents with 95% osital stenosis, s/p DES to osital/prox RCA 01/24/13. Patient has a history of statin intolerance and presents to lipid clinic for further management.  States he has previously tried pravastatin, simvastatin, Lipitor and Crestor to lower his cholesterol, and that he was intolerant to these due to  myalgias. He reports that his symptoms resolved upon discontinuation of statin.   After discussing his other medications, pt states he is no longer taking his carvedilol, losartan or spironolactone.  He states he took these for about a month this Spring but felt very fatigued and so he stopped them.  His biggest complaint today is his SOB on exertion.  He states he tries to do some activities outside but has to stop after just small amounts of exertion due to SOB and excessive sweating.   Current Cholesterol Medications: none Intolerances: Crestor 10mg  daily (myalgias), Lipitor (myalgias), pravastatin, simvastatin  Risk Factors: CAD with RCA 95% stenosis, 6 stents placed, family history, age  LDL goal: <70 mg/dL  Diet: Breakfast- eggs, bacon, pancakes. Lunch- Chicken/Turkey sandwich. Dinner- red meats  Exercise: Limited due to SOB since May 2017  Family History: Father- heart attack (age 32), Uncle- heart attack (age 25).   Social History: Quit smoking in 1990, and never used smokeless tobacco.   Labs: 07/08/16- TC 233, TG, 218, HDL 40, LDL 149, AST 18, ALT 17 (no therapy)  Past Medical History:  Diagnosis Date  . Adenocarcinoma of prostate ([place])    s/p seed implants  . Arthritis   . Cellulitis of left leg    a. 0000000 complicated by septic shock  . Chronic combined systolic and  diastolic CHF (congestive heart failure) ([place])   . Colon polyps   . CORONARY ATHEROSCLEROSIS NATIVE CORONARY ARTERY    a. 01/2011 Cath/PCI: LM nl, LAD 40-50p, D1 80-small, LCX 95-small, RI 90, RCA 100, EF 20%;  b. 01/2011 Card MRI - No transmural scar;  c. 01/2011 PCI RCA->5 Promus DES, RI->3.0x16 Promus DES; d. Cath 01/13/13 patent LAD & Ramus, diffuse LCx dz, RCA mult overlapping stents w/ 95% osital stenosis, EF 20%, s/p DES to ostial/prox RCA 01/24/13   . Diabetes mellitus, type 2 ([place])   . GERD (gastroesophageal reflux disease)   . Hematoma of leg    a. left leg hematoma 03/2012 in the setting of asa/effient  . Herpes zoster ophthalmicus   . HYPERLIPIDEMIA    intolerant to Lipitor (myalgias)  . HYPERTENSION   . Ischemic cardiomyopathy    a. 01/2012 Echo EF 45%, mild LVH; b. A999333, grade 1 diastolic dysfunction, diffuse hypokinesis, inferoposterior akinesis   . Noncompliance   . Obesity   . OSA (obstructive sleep apnea)   . Polymyalgia rheumatica ([place])     Current Outpatient Prescriptions on File Prior to Visit  Medication Sig Dispense Refill  . aspirin EC 81 MG tablet Take 81 mg by mouth daily.    . carvedilol (COREG) 12.5 MG tablet Take 1 tablet (12.5 mg total) by mouth 2 (two) times  daily. 60 tablet 6  . furosemide (LASIX) 20 MG tablet TAKE 3 TABLETS (60 MG TOTAL) BY MOUTH DAILY. 90 tablet 3  . losartan (COZAAR) 50 MG tablet Take 1 tablet (50 mg total) by mouth daily. 30 tablet 6  . Multiple Vitamins-Minerals (CENTRUM SILVER ADULT 50+) TABS Take 1 tablet by mouth daily.    Marland Kitchen spironolactone (ALDACTONE) 25 MG tablet Take 0.5 tablets (12.5 mg total) by mouth daily. 15 tablet 3   No current facility-administered medications on file prior to visit.     Allergies  Allergen Reactions  . Plavix [Clopidogrel]     Nose bleeds, swellings, whelps on legs & back, itching  . Lipitor [Atorvastatin] Other (See Comments)    REACTION: sore legs  . Crestor [Rosuvastatin Calcium] Other (See  Comments)    Myalgias, Interfering with Gait  . Entresto [Sacubitril-Valsartan]     Angioedema  . Ancef [Cefazolin] Rash    Assessment/Plan: 1. Hyperlipidemia with CAD s/p RCA mult overlapping stents w/95% osital stenosis: Most recent LDL last month was elevated at 149 mg/dL, above goal of <70 mg/dL. Patient had tried four different statin medications, with weakness and aches. Given his CAD and ASCVD risk factors, options were discussed with the patient including very low dose Crestor and PCSK9 inhibitors. Patient wants to re-challenge with Crestor at very low dose. Advised patient to self-titrate Crestor 5mg  once a week as tolerated to 3-4x/week. Will f/u with lipid panels in 3 months.   2. CHF- Pt stopped his beta-blocker, ARB, and spironolactone.  We discussed the risks of just stopping these medications.  Explained his SOB may be related to his EF and by using these medications, we can hopefully improve his symptoms.  He is still taking furosemide 60mg  daily.  BP on the lower side today.  Will restart BB today.  Hold off on ARB to ensure BP can handle both agents.  He has a follow up with heart failure clinic in 2 weeks so they can restart ARB and/or increase beta-blocker dose at that time.

## 2016-08-01 ENCOUNTER — Telehealth: Payer: Self-pay | Admitting: Family Medicine

## 2016-08-01 NOTE — Telephone Encounter (Signed)
° °  Pt call and would like a call concerning his lab work he has last week

## 2016-08-04 NOTE — Telephone Encounter (Signed)
Pt came into the office and is aware of results. I provided him with a copy of his lab results.

## 2016-08-08 ENCOUNTER — Other Ambulatory Visit: Payer: Self-pay

## 2016-08-12 ENCOUNTER — Encounter (HOSPITAL_COMMUNITY): Payer: Self-pay

## 2016-08-12 ENCOUNTER — Ambulatory Visit (HOSPITAL_COMMUNITY)
Admission: RE | Admit: 2016-08-12 | Discharge: 2016-08-12 | Disposition: A | Discharge status: Home / Self Care | Payer: Medicare Other | Source: Ambulatory Visit | Attending: Cardiology | Admitting: Cardiology

## 2016-08-12 VITALS — BP 136/80 | HR 86 | Wt 240.5 lb

## 2016-08-12 DIAGNOSIS — Z79899 Other long term (current) drug therapy: Secondary | ICD-10-CM | POA: Diagnosis not present

## 2016-08-12 DIAGNOSIS — M353 Polymyalgia rheumatica: Secondary | ICD-10-CM | POA: Diagnosis not present

## 2016-08-12 DIAGNOSIS — E119 Type 2 diabetes mellitus without complications: Secondary | ICD-10-CM | POA: Insufficient documentation

## 2016-08-12 DIAGNOSIS — Z8249 Family history of ischemic heart disease and other diseases of the circulatory system: Secondary | ICD-10-CM | POA: Diagnosis not present

## 2016-08-12 DIAGNOSIS — Z888 Allergy status to other drugs, medicaments and biological substances status: Secondary | ICD-10-CM | POA: Diagnosis not present

## 2016-08-12 DIAGNOSIS — Z8546 Personal history of malignant neoplasm of prostate: Secondary | ICD-10-CM | POA: Insufficient documentation

## 2016-08-12 DIAGNOSIS — I739 Peripheral vascular disease, unspecified: Secondary | ICD-10-CM | POA: Diagnosis not present

## 2016-08-12 DIAGNOSIS — E785 Hyperlipidemia, unspecified: Secondary | ICD-10-CM | POA: Insufficient documentation

## 2016-08-12 DIAGNOSIS — I110 Hypertensive heart disease with heart failure: Secondary | ICD-10-CM | POA: Diagnosis not present

## 2016-08-12 DIAGNOSIS — Z87891 Personal history of nicotine dependence: Secondary | ICD-10-CM | POA: Insufficient documentation

## 2016-08-12 DIAGNOSIS — I255 Ischemic cardiomyopathy: Secondary | ICD-10-CM | POA: Diagnosis not present

## 2016-08-12 DIAGNOSIS — E669 Obesity, unspecified: Secondary | ICD-10-CM | POA: Insufficient documentation

## 2016-08-12 DIAGNOSIS — I2510 Atherosclerotic heart disease of native coronary artery without angina pectoris: Secondary | ICD-10-CM

## 2016-08-12 DIAGNOSIS — Z6836 Body mass index (BMI) 36.0-36.9, adult: Secondary | ICD-10-CM | POA: Insufficient documentation

## 2016-08-12 DIAGNOSIS — I5022 Chronic systolic (congestive) heart failure: Secondary | ICD-10-CM | POA: Diagnosis not present

## 2016-08-12 DIAGNOSIS — Z7982 Long term (current) use of aspirin: Secondary | ICD-10-CM | POA: Insufficient documentation

## 2016-08-12 DIAGNOSIS — G4733 Obstructive sleep apnea (adult) (pediatric): Secondary | ICD-10-CM | POA: Diagnosis not present

## 2016-08-12 LAB — BASIC METABOLIC PANEL
Anion gap: 10 (ref 5–15)
BUN: 16 mg/dL (ref 6–20)
CHLORIDE: 105 mmol/L (ref 101–111)
CO2: 24 mmol/L (ref 22–32)
CREATININE: 1.13 mg/dL (ref 0.61–1.24)
Calcium: 9.5 mg/dL (ref 8.9–10.3)
GFR calc non Af Amer: >60 mL/min (ref >60)
Glucose, Bld: 211 mg/dL — ABNORMAL HIGH (ref 65–99)
POTASSIUM: 3.8 mmol/L (ref 3.5–5.1)
SODIUM: 139 mmol/L (ref 135–145)

## 2016-08-12 LAB — BRAIN NATRIURETIC PEPTIDE: B Natriuretic Peptide: 2294.5 pg/mL — ABNORMAL HIGH (ref 0.0–100.0)

## 2016-08-12 MED ORDER — POTASSIUM CHLORIDE CRYS ER 20 MEQ PO TBCR: 20.0000 meq | EXTENDED_RELEASE_TABLET | Freq: Every day | ORAL | 3 refills | Status: DC

## 2016-08-12 MED ORDER — LOSARTAN POTASSIUM 25 MG PO TABS: 12.5000 mg | ORAL_TABLET | Freq: Every day | ORAL | 3 refills | Status: DC

## 2016-08-12 MED ORDER — SPIRONOLACTONE 25 MG PO TABS: 12.5000 mg | ORAL_TABLET | Freq: Every day | ORAL | 3 refills | Status: DC

## 2016-08-12 MED ORDER — FUROSEMIDE 20 MG PO TABS: ORAL_TABLET | ORAL | 3 refills | Status: DC

## 2016-08-12 NOTE — Patient Instructions (Signed)
Increase Furosemide (Lasix) to 60 mg (3 tabs) in AM and 40 mg (2 tabs) in PM  Start Losartan 12.5 mg (1/2 tab) daily  Start Spironolactone 12.5 mg (1/2 tab) daily  Start Potassium (k-dur) 20 meq daily  Labs today  Labs in 1 week  Your physician recommends that you schedule a follow-up appointment in: 2 weeks

## 2016-08-13 NOTE — Progress Notes (Signed)
Patient ID: Wayne [name], male   DOB: 21-May-1941, 75 y.o.   MRN: [id number]   Advanced Heart Failure Clinic Note   Patient ID: Wayne [name], male   DOB: Mar 02, 1941, 75 y.o.   MRN: [id number] PCP: Dr. Elease Hashimoto Cardiology: Dr. Aundra Dubin  75 yo with history of DM, HTN, CAD, and ischemic cardiomyopathy presents for cardiology followup. Patient had a CHF exacerbation in 2/12 and was found to have LV systolic dysfunction with EF around 20%. LHC showed RCA, ramus, and CFX disease. RCA was subtotally occluded. Cardiac MRI showed that all walls, including the inferior wall, should be viable. Patient therefore underwent opening of his chronic totally occluded RCA as well as PCI to the ramus in 2/12. He received drug eluting stents and was on Effient.  He had an echo in 2/13 that showed EF improved to 45% with moderate LV dilation and mild LV hypertrophy.   In 5/13, he developed a large left lower leg hematoma.  He was still on Effient at that time.  ASA and Effient were stopped.  The hematoma did not resolve.  In 7/13, he was re-admitted with septic shock from MSSA from an abscess in his left gastrocnemius.  He also grew Pseudomonas from the left gastrocnemius as well.  He had a prolonged course in the hospital and later in a rehab facility.  Ultimately, he got back home again.  I had him get an echo in 1/14, and this showed EF 15% with diffuse hypokinesis and inferoposterior akinesis.  He had been off of most of his prior cardiac medications.  I took him back for [facility] in 1/14.  This showed subtotal occlusion of a small AV LCx and 95% ostial in-stent restenosis in the RCA.  He was treated in 2/14 with a Xience DES to the ostial RCA and begun on Plavix.  Unfortunately, he developed diffuse hives after starting Plavix and had to be switched to ticlopidine.  He has tolerated ticlopidine.   Echo done in 12/14 showed some improvement in LV function but EF was still low at 30-35%.  He did not want ICD.   Presented to ED  11/14/15 with worsening SOB after a few steps and CXR with CHF and bilateral effusions in the setting of marked medical non-compliance. States he stopped taking his medicines in 01/2015 (except for ASA 81 and a multivitamin).  He was feeling good and decided that he did not need them anymore.  Also c/o URI symptoms. Diuresed over 2 L with IV diuretics in the ER and started on lasix 20 mg daily.   Echo (12/16) showed EF 20-25% with diffuse hypokinesis (down from 35% in 12/15).    He is seen in followup today.  He again stopped all his meds other than Lasix.  Says the medications make him tired and short of breath.  However, he's still tired and short of breath after stopping the meds.  He was seen in lipid clinic and started back on Crestor 5 mg daily and Toprol XL 25 daily.   He is short of breath with moderate exertion and walking up an incline.  He has prominent orthopnea and PND.  No chest pain.  No lightheadedness.    Labs (11/14): LDL 88, HDL 38 Labs (2/15): BNP 85, K 4.7, creatinine 1.0 Labs (10/15): K 4, creatinine 1.1 Labs (12/15): LDL 91, HDL 29 Labs (11/16): K 5.2, creatinine 1.11, HCT 46.4 Labs (12/16): K 4.9, creatinine 1.23 Labs (1/17): K 4.7, creatinine 1.15, BNP 1433 Labs (  2/17): K 4.7, creatinine 1.14, BNP 870 Labs (3/17): K 4.5, creatinine 1.10, BNP 1257 Labs (5/17): K 4.2, creatinine 1.2, BNP 818 Labs (7/17): LDL 149, HDL 40, TGs 210  Past Medical History:  1. HYPERTENSION  2. HYPERLIPIDEMIA: Myalgias with atorvastatin and Crestor 3. ECZEMA  4. RHINITIS  5. HERPES ZOSTER OPHTHALMICUS  6. ADENOCARCINOMA, PROSTATE: Status post prostatectomy in 2009. Has had some incontinence since then.  7. Diabetes mellitus type II  8. Arthritis  9. Obesity  10. GERD: rare  11. CAD: Presented with exertional dyspnea, never had chest pain. LHC (2/12) with subtotalled proximal RCA and left to right collaterals, 90% proximal moderate-sized ramus, 95% proximal relatively small CFX, 40-50%  proximal LAD. Cardiac MRI (2/12) showed EF 21%, some mild scar in basal segments but all wall segments would be expected to be viable. DES x 5 (overlapping) to RCA, DES x 1 to RI 01/30/11 .  Bled into leg with Effient use (long, complicated course).  LHC (1/14): AV LCx small with subtotal occlusion, 95% ostial instent restenosis in RCA. PCI in 2/14 to ostial RCA with 3.5 x 15 Xience DES.  Plavix allergy (hives) so put on ticlopidine.  12. Ischemic CMP: Echo (2/12) with moderately dilated LV, EF about 20% with diffuse hypokinesis and inferior akinesis, pseudonormal diastolic function, mild MR, severe LAE, mildly decreased RV systolic function. RHC (2/12) with mean RA 12, PA 40/25, mean PCWP 26, CI 2.1.  Echo (5/12) with EF 40% (appeared worse to my eye) with posterior HK, basal inferior AK, inferoseptal AK, basal anteroseptal AK, mild MR.  Cardiac MRI was repeated and showed EF 32% (improved from 21%) and mild LV dilation (was severely dilated before) with diffuse hypokinesis and subendocardial scar in the basal inferior, basal posterior, and basal anterolateral segments.  Echo (2/13) with EF 45%, moderate LV dilation, mild LVH.  Echo (1/14) with EF 15%, diffuse hypokinesis, inferoposterior akinesis, mild MR, grade I diastolic dysfunction.  Echo (5/14) with EF 30-35%, mild LV dilation, akinesis of the basal inferior wall otherwise diffuse hypokinesis.  Echo (12/14) with EF 30-35%, mild LV dilation, diffuse hypokinesis with basal inferior and posterior akinesis. Echo (12/15) with EF 35%, mildly dilated LV, wall motion abnormalities, normal RV size and systolic function. Echo (12/16) with EF 20-25%, diffuse hypokinesis, severe LV dilation, mild MR.  - Possible angioedema related to Entresto.  13. Cervical OA.  14. Polymyalgia rheumatica 15. OSA: Severe on sleep study 10/12.  On CPAP.  16. Left lower leg hematoma in setting of Effient use.  He developed a left gastrocnemius abscess and septic shock with prolonged  hospitalization beginning in 7/13.  17. PAD: Lower extremity arterial doppler evaluation in 2/17 showed occluded peroneal arteries bilaterally, ABI 1.1 (R) and 1.0 (L).    Family History:  Father died with MI at age 66. He was an alcoholic. Mother died with cancer at around 65.   Social History:  Occupation: retired Administrator  Married, lives in [place]  Past smoker, quit around [place]: all systems reviewed and negative except as per HPI.    Current Outpatient Prescriptions  Medication Sig Dispense Refill  . aspirin EC 81 MG tablet Take 81 mg by mouth daily.    . furosemide (LASIX) 20 MG tablet Take 3 tabs in AM and 2 tabs in PM 150 tablet 3  . metoprolol succinate (TOPROL XL) 25 MG 24 hr tablet Take 1 tablet (25 mg total) by mouth daily. 30 tablet 6  . Multiple Vitamins-Minerals (  CENTRUM SILVER ADULT 50+) TABS Take 1 tablet by mouth daily.    . rosuvastatin (CRESTOR) 5 MG tablet Take 1 tablet (5 mg total) by mouth daily. 30 tablet 6  . losartan (COZAAR) 25 MG tablet Take 0.5 tablets (12.5 mg total) by mouth daily. 15 tablet 3  . potassium chloride SA (K-DUR,KLOR-CON) 20 MEQ tablet Take 1 tablet (20 mEq total) by mouth daily. 30 tablet 3  . spironolactone (ALDACTONE) 25 MG tablet Take 0.5 tablets (12.5 mg total) by mouth daily. 15 tablet 3   No current facility-administered medications for this encounter.     BP 136/80   Pulse 86   Wt 240 lb 8 oz (109.1 kg)   SpO2 100%   BMI 36.57 kg/m    Wt Readings from Last 3 Encounters:  08/12/16 240 lb 8 oz (109.1 kg)  07/14/16 244 lb 9.6 oz (110.9 kg)  04/20/16 234 lb (106.1 kg)    General: NAD, obese.  Neck: Thick, JVP 12 cm, no thyromegaly or thyroid nodule. No carotid bruit.  HEENT: Normal.  Lungs: Bibasilar crackles CV: Nondisplaced PMI. Heart regular S1/S2, no S3/S4, no murmur. Difficult to palpate pedal pulses.  Abdomen: Soft, NT, mild/moderate distention, no HSM. No bruits or masses. +BS  Neurologic: Alert and  oriented x 3.  Psych: Pleasant affect.  Extremities: No clubbing or cyanosis. 1+ edema to knees bilaterally.    Assessment/Plan:  1. Chronic systolic CHF: Ischemic cardiomyopathy.  12/16 echo with EF 20-25%, diffuse hypokinesis.  NYHA class III symptoms, he is volume overloaded on exam.  - We had a long talk today about the severity of his cardiac illness.  I told him that his symptoms are more likely to be due to his underlying cardiac disease than to the medications he is taking to treat the cardiac disease.  I asked him to let me know before he stops all his medications in the future.  I strongly encouraged him to be compliant with his cardiac meds.  He has had a downward trajectory recently and noncompliance does not help with this.    - Increase Lasix to 60 qam/40 qpm and add KCl 20 daily.  BMET today and again in 1 week.  - Restart spironolactone 12.5 daily and losartan 12.5 mg daily.  - Can continue current Toprol XL (rather than going back to Coreg).   - Of note, patient had possible angioedema related to [facility]. - Pt has refused ICD.  He would not be a CRT candidate with narrow QRS.  - In the future, would be difficult to offer advanced therapies unless he shows that he can be compliant with medical program.  2. CAD: No CP.  Last PCI 01/2013. Completed his year-long course of ticlopidine.  He was allergic to Plavix and had severe gastrocnemius bleed with Effient. - Continue ASA 81 and Crestor 5 daily.  3. HLD: Has had myalgias with Crestor and atorvastatin. Now trying Crestor 5.  If LDL still high, would try to get him on Repatha. 4. PAD: Occluded peroneal arteries bilaterally.  No significant claudication.   Followup in 2 weeks.     Loralie Champagne  08/13/2016

## 2016-08-22 NOTE — Telephone Encounter (Signed)
Please close Encounter °

## 2016-08-25 ENCOUNTER — Ambulatory Visit (HOSPITAL_COMMUNITY)
Admission: RE | Admit: 2016-08-25 | Discharge: 2016-08-25 | Disposition: A | Discharge status: Home / Self Care | Payer: Medicare Other | Source: Ambulatory Visit | Attending: Cardiology | Admitting: Cardiology

## 2016-08-25 DIAGNOSIS — I5022 Chronic systolic (congestive) heart failure: Secondary | ICD-10-CM | POA: Insufficient documentation

## 2016-08-25 LAB — BASIC METABOLIC PANEL
ANION GAP: 11 (ref 5–15)
BUN: 23 mg/dL — AB (ref 6–20)
CALCIUM: 9.7 mg/dL (ref 8.9–10.3)
CO2: 27 mmol/L (ref 22–32)
Chloride: 99 mmol/L — ABNORMAL LOW (ref 101–111)
Creatinine, Ser: 1.25 mg/dL — ABNORMAL HIGH (ref 0.61–1.24)
GFR calc Af Amer: >60 mL/min (ref >60)
GFR, EST NON AFRICAN AMERICAN: 56 mL/min — AB (ref >60)
GLUCOSE: 355 mg/dL — AB (ref 65–99)
Potassium: 4.4 mmol/L (ref 3.5–5.1)
SODIUM: 137 mmol/L (ref 135–145)

## 2016-08-27 ENCOUNTER — Ambulatory Visit (HOSPITAL_COMMUNITY)
Admission: RE | Admit: 2016-08-27 | Discharge: 2016-08-27 | Disposition: A | Discharge status: Home / Self Care | Payer: Medicare Other | Source: Ambulatory Visit | Attending: Cardiology | Admitting: Cardiology

## 2016-08-27 VITALS — BP 120/74 | HR 91 | Wt 233.8 lb

## 2016-08-27 DIAGNOSIS — Z79899 Other long term (current) drug therapy: Secondary | ICD-10-CM | POA: Diagnosis not present

## 2016-08-27 DIAGNOSIS — Z87891 Personal history of nicotine dependence: Secondary | ICD-10-CM | POA: Insufficient documentation

## 2016-08-27 DIAGNOSIS — Z888 Allergy status to other drugs, medicaments and biological substances status: Secondary | ICD-10-CM | POA: Insufficient documentation

## 2016-08-27 DIAGNOSIS — Z7982 Long term (current) use of aspirin: Secondary | ICD-10-CM | POA: Insufficient documentation

## 2016-08-27 DIAGNOSIS — E785 Hyperlipidemia, unspecified: Secondary | ICD-10-CM

## 2016-08-27 DIAGNOSIS — I110 Hypertensive heart disease with heart failure: Secondary | ICD-10-CM | POA: Diagnosis not present

## 2016-08-27 DIAGNOSIS — I2510 Atherosclerotic heart disease of native coronary artery without angina pectoris: Secondary | ICD-10-CM

## 2016-08-27 DIAGNOSIS — I255 Ischemic cardiomyopathy: Secondary | ICD-10-CM | POA: Insufficient documentation

## 2016-08-27 DIAGNOSIS — E669 Obesity, unspecified: Secondary | ICD-10-CM | POA: Diagnosis not present

## 2016-08-27 DIAGNOSIS — I5022 Chronic systolic (congestive) heart failure: Secondary | ICD-10-CM | POA: Diagnosis not present

## 2016-08-27 DIAGNOSIS — E1151 Type 2 diabetes mellitus with diabetic peripheral angiopathy without gangrene: Secondary | ICD-10-CM | POA: Diagnosis not present

## 2016-08-27 DIAGNOSIS — M791 Myalgia: Secondary | ICD-10-CM | POA: Diagnosis not present

## 2016-08-27 DIAGNOSIS — Z6835 Body mass index (BMI) 35.0-35.9, adult: Secondary | ICD-10-CM | POA: Insufficient documentation

## 2016-08-27 MED ORDER — SPIRONOLACTONE 25 MG PO TABS: 25.0000 mg | ORAL_TABLET | Freq: Every day | ORAL | 3 refills | Status: DC

## 2016-08-27 NOTE — Progress Notes (Signed)
Advanced Heart Failure Medication Review by a Pharmacist  Does the patient  feel that his/her medications are working for him/her?  yes  Has the patient been experiencing any side effects to the medications prescribed?  no  Does the patient measure his/her own blood pressure or blood glucose at home?  yes   Does the patient have any problems obtaining medications due to transportation or finances?   no  Understanding of regimen: good Understanding of indications: good Potential of compliance: good Patient understands to avoid NSAIDs. Patient understands to avoid decongestants.  Issues to address at subsequent visits: None   Pharmacist comments:  Wayne [name] is a pleasant 75 yo M presenting with his medication list. He reports good compliance with his regimen and did not have any specific medication-related questions or concerns for me at this time.   Ruta Hinds. Velva Harman, PharmD, BCPS, CPP Clinical Pharmacist Pager: [phone] Phone: [phone] 08/27/2016 10:44 AM      Time with patient: 8 minutes Preparation and documentation time: 2 minutes Total time: 10 minutes

## 2016-08-27 NOTE — Patient Instructions (Signed)
INCREASE Spironolactone to 25 mg one tab daily  Labs needed in 10 days   Your physician recommends that you schedule a follow-up appointment in: 3 weeks with Aundra Dubin  Do the following things EVERYDAY: 1) Weigh yourself in the morning before breakfast. Write it down and keep it in a log. 2) Take your medicines as prescribed 3) Eat low salt foods-Limit salt (sodium) to 2000 mg per day.  4) Stay as active as you can everyday 5) Limit all fluids for the day to less than 2 liters

## 2016-08-27 NOTE — Progress Notes (Signed)
Patient ID: Wayne [name], male   DOB: [birth year], 75 y.o.   MRN: [id number]   Advanced Heart Failure Clinic Note   Patient ID: Wayne [name], male   DOB: Dec 28, 1940, 75 y.o.   MRN: [id number] PCP: Dr. Elease Hashimoto Cardiology: Dr. Aundra Dubin  75 yo with history of DM, HTN, CAD, and ischemic cardiomyopathy presents for cardiology followup. Patient had a CHF exacerbation in 2/12 and was found to have LV systolic dysfunction with EF around 20%. LHC showed RCA, ramus, and CFX disease. RCA was subtotally occluded. Cardiac MRI showed that all walls, including the inferior wall, should be viable. Patient therefore underwent opening of his chronic totally occluded RCA as well as PCI to the ramus in 2/12. He received drug eluting stents and was on Effient.  He had an echo in 2/13 that showed EF improved to 45% with moderate LV dilation and mild LV hypertrophy.   In 5/13, he developed a large left lower leg hematoma.  He was still on Effient at that time.  ASA and Effient were stopped.  The hematoma did not resolve.  In 7/13, he was re-admitted with septic shock from MSSA from an abscess in his left gastrocnemius.  He also grew Pseudomonas from the left gastrocnemius as well.  He had a prolonged course in the hospital and later in a rehab facility.  Ultimately, he got back home again.  I had him get an echo in 1/14, and this showed EF 15% with diffuse hypokinesis and inferoposterior akinesis.  He had been off of most of his prior cardiac medications.  I took him back for [facility] in 1/14.  This showed subtotal occlusion of a small AV LCx and 95% ostial in-stent restenosis in the RCA.  He was treated in 2/14 with a Xience DES to the ostial RCA and begun on Plavix.  Unfortunately, he developed diffuse hives after starting Plavix and had to be switched to ticlopidine.  He has tolerated ticlopidine.   Echo done in 12/14 showed some improvement in LV function but EF was still low at 30-35%.  He did not want ICD.   Presented to ED  11/14/15 with worsening SOB after a few steps and CXR with CHF and bilateral effusions in the setting of marked medical non-compliance. States he stopped taking his medicines in 01/2015 (except for ASA 81 and a multivitamin).  He was feeling good and decided that he did not need them anymore.  Also c/o URI symptoms. Diuresed over 2 L with IV diuretics in the ER and started on lasix 20 mg daily.   Echo (12/16) showed EF 20-25% with diffuse hypokinesis (down from 35% in 12/15).    He is seen in follow up today.  Last visit he was volume overloaded. He was started on spiro and losartan. Also increased diuretics regimen.  + PND. + Orthopnea. Says he is able to do more yard work. Weight at home went down from 244 to 232 pounds. Compliant with medications. Doesn't like taking medications. Lives with his wife.   Labs (11/14): LDL 88, HDL 38 Labs (2/15): BNP 85, K 4.7, creatinine 1.0 Labs (10/15): K 4, creatinine 1.1 Labs (12/15): LDL 91, HDL 29 Labs (11/16): K 5.2, creatinine 1.11, HCT 46.4 Labs (12/16): K 4.9, creatinine 1.23 Labs (1/17): K 4.7, creatinine 1.15, BNP 1433 Labs (2/17): K 4.7, creatinine 1.14, BNP 870 Labs (3/17): K 4.5, creatinine 1.10, BNP 1257 Labs (5/17): K 4.2, creatinine 1.2, BNP 818 Labs (7/17): LDL 149, HDL 40, TGs  210 Labs (08/12/2016: K 3.8 Creatinine 1.13 Labs (08/25/2016): K 4.4 Creatinine 1.25   Past Medical History:  1. HYPERTENSION  2. HYPERLIPIDEMIA: Myalgias with atorvastatin and Crestor 3. ECZEMA  4. RHINITIS  5. HERPES ZOSTER OPHTHALMICUS  6. ADENOCARCINOMA, PROSTATE: Status post prostatectomy in 2009. Has had some incontinence since then.  7. Diabetes mellitus type II  8. Arthritis  9. Obesity  10. GERD: rare  11. CAD: Presented with exertional dyspnea, never had chest pain. LHC (2/12) with subtotalled proximal RCA and left to right collaterals, 90% proximal moderate-sized ramus, 95% proximal relatively small CFX, 40-50% proximal LAD. Cardiac MRI (2/12) showed  EF 21%, some mild scar in basal segments but all wall segments would be expected to be viable. DES x 5 (overlapping) to RCA, DES x 1 to RI 01/30/11 .  Bled into leg with Effient use (long, complicated course).  LHC (1/14): AV LCx small with subtotal occlusion, 95% ostial instent restenosis in RCA. PCI in 2/14 to ostial RCA with 3.5 x 15 Xience DES.  Plavix allergy (hives) so put on ticlopidine.  12. Ischemic CMP: Echo (2/12) with moderately dilated LV, EF about 20% with diffuse hypokinesis and inferior akinesis, pseudonormal diastolic function, mild MR, severe LAE, mildly decreased RV systolic function. RHC (2/12) with mean RA 12, PA 40/25, mean PCWP 26, CI 2.1.  Echo (5/12) with EF 40% (appeared worse to my eye) with posterior HK, basal inferior AK, inferoseptal AK, basal anteroseptal AK, mild MR.  Cardiac MRI was repeated and showed EF 32% (improved from 21%) and mild LV dilation (was severely dilated before) with diffuse hypokinesis and subendocardial scar in the basal inferior, basal posterior, and basal anterolateral segments.  Echo (2/13) with EF 45%, moderate LV dilation, mild LVH.  Echo (1/14) with EF 15%, diffuse hypokinesis, inferoposterior akinesis, mild MR, grade I diastolic dysfunction.  Echo (5/14) with EF 30-35%, mild LV dilation, akinesis of the basal inferior wall otherwise diffuse hypokinesis.  Echo (12/14) with EF 30-35%, mild LV dilation, diffuse hypokinesis with basal inferior and posterior akinesis. Echo (12/15) with EF 35%, mildly dilated LV, wall motion abnormalities, normal RV size and systolic function. Echo (12/16) with EF 20-25%, diffuse hypokinesis, severe LV dilation, mild MR.  - Possible angioedema related to Entresto.  13. Cervical OA.  14. Polymyalgia rheumatica 15. OSA: Severe on sleep study 10/12.  On CPAP.  16. Left lower leg hematoma in setting of Effient use.  He developed a left gastrocnemius abscess and septic shock with prolonged hospitalization beginning in 7/13.  17.  PAD: Lower extremity arterial doppler evaluation in 2/17 showed occluded peroneal arteries bilaterally, ABI 1.1 (R) and 1.0 (L).    Family History:  Father died with MI at age 77. He was an alcoholic. Mother died with cancer at around 100.   Social History:  Occupation: retired Administrator  Married, lives in [place]  Past smoker, quit around [place]: all systems reviewed and negative except as per HPI.    Current Outpatient Prescriptions  Medication Sig Dispense Refill  . aspirin EC 81 MG tablet Take 81 mg by mouth daily.    . furosemide (LASIX) 20 MG tablet Take 3 tabs in AM and 2 tabs in PM 150 tablet 3  . losartan (COZAAR) 25 MG tablet Take 0.5 tablets (12.5 mg total) by mouth daily. 15 tablet 3  . metoprolol succinate (TOPROL XL) 25 MG 24 hr tablet Take 1 tablet (25 mg total) by mouth daily. 30 tablet 6  .  Multiple Vitamins-Minerals (CENTRUM SILVER ADULT 50+) TABS Take 1 tablet by mouth daily.    . potassium chloride SA (K-DUR,KLOR-CON) 20 MEQ tablet Take 1 tablet (20 mEq total) by mouth daily. 30 tablet 3  . rosuvastatin (CRESTOR) 5 MG tablet Take 1 tablet (5 mg total) by mouth daily. 30 tablet 6  . spironolactone (ALDACTONE) 25 MG tablet Take 0.5 tablets (12.5 mg total) by mouth daily. 15 tablet 3   No current facility-administered medications for this encounter.     BP 120/74 (BP Location: Left Arm, Patient Position: Sitting, Cuff Size: Large)   Pulse 91   Wt 233 lb 12.8 oz (106.1 kg)   SpO2 99%   BMI 35.55 kg/m    Wt Readings from Last 3 Encounters:  08/27/16 233 lb 12.8 oz (106.1 kg)  08/12/16 240 lb 8 oz (109.1 kg)  07/14/16 244 lb 9.6 oz (110.9 kg)    General: NAD, obese.  Neck: Thick, JVP 7-8, no thyromegaly or thyroid nodule. No carotid bruit.  HEENT: Normal.  Lungs: Bibasilar crackles CV: Nondisplaced PMI. Heart regular S1/S2, no S3/S4, no murmur. Difficult to palpate pedal pulses.  Abdomen: Soft, NT, mild/moderate distention, no HSM. No bruits or  masses. +BS  Neurologic: Alert and oriented x 3.  Psych: Pleasant affect.  Extremities: No clubbing or cyanosis. Trace edema to knees bilaterally.    Assessment/Plan:  1. Chronic systolic CHF: Ischemic cardiomyopathy.  12/16 echo with EF 20-25%, diffuse hypokinesis.  NYHA class III symptoms, he is volume overloaded on exam.  - - Continue Lasix to 60 qam/40 pm and add KCl 20 daily.   - Increased spiro 25 mg daily. Check BMET today and in 10 days.  - Continue losartan 12.5 mg daily.  - Can continue current Toprol XL (rather than going back to Coreg).   - Of note, patient had possible angioedema related to [facility]. - Pt has refused ICD.  He would not be a CRT candidate with narrow QRS.  - In the future, would be difficult to offer advanced therapies unless he shows that he can be compliant with medical program.  2. CAD: No CP.  Last PCI 01/2013. Completed his year-long course of ticlopidine.  He was allergic to Plavix and had severe gastrocnemius bleed with Effient. - Continue ASA 81 and Crestor 5 daily.  3. HLD: Has had myalgias with Crestor and atorvastatin. Now trying Crestor 5.  If LDL still high, would try to get him on Repatha. 4. PAD: Occluded peroneal arteries bilaterally.  No significant claudication.   BMET in 10 days.  Follow up in 3 weeks with Dr Aundra Dubin.      Amy Clegg  NP-C  08/27/2016

## 2016-08-28 ENCOUNTER — Telehealth: Payer: Self-pay

## 2016-08-28 ENCOUNTER — Telehealth (HOSPITAL_COMMUNITY): Payer: Self-pay | Admitting: *Deleted

## 2016-08-28 NOTE — Telephone Encounter (Signed)
Call to patient to offer [facility] Screening appointment. He was very interested and said he will speak to his wife and call me back.

## 2016-08-28 NOTE — Telephone Encounter (Signed)
Notes Recorded by Harvie Junior, CMA on 08/28/2016 at 9:26 AM EDT Patient aware. Patient stated he is taking his medications as prescribed.   ------  Notes Recorded by Larey Dresser, MD on 08/26/2016 at 8:20 PM EDT Labs ok. Would check with him to make sure he is taking his meds.    Ref Range & Units 3d ago 2wk ago 34mo ago   Sodium 135 - 145 mmol/L 137  139  140    Potassium 3.5 - 5.1 mmol/L 4.4  3.8  4.2    Chloride 101 - 111 mmol/L 99   105  106    CO2 22 - 32 mmol/L 27  24  24     Glucose, Bld 65 - 99 mg/dL 355   211   204     BUN 6 - 20 mg/dL 23   16  21      Creatinine, Ser 0.61 - 1.24 mg/dL 1.25   1.13  1.21    Calcium 8.9 - 10.3 mg/dL 9.7  9.5  9.3    GFR calc non Af Amer >60 mL/min 56   >60  58     GFR calc Af Amer >60 mL/min >60  >60CM  >60CM   Comments: (NOTE)

## 2016-08-29 ENCOUNTER — Telehealth: Payer: Self-pay

## 2016-08-29 NOTE — Telephone Encounter (Signed)
Patient called and request an appointment for BeAT-HF Screening. Will come Monday 9-18 at 8am.

## 2016-09-01 DIAGNOSIS — Z006 Encounter for examination for normal comparison and control in clinical research program: Secondary | ICD-10-CM

## 2016-09-01 NOTE — Progress Notes (Signed)
Patient present with his wife to discuss BeAT-HF Study. Writer went over study inclusions and exclusions, and answered questions. Patient states he does want to participate and will return next Friday for screening appointment; at that time he will be 4 weeks on current medicine regimen (a study inclusion). Very pleasant and states he has been diligently taking his medications, and does feel better.

## 2016-09-05 ENCOUNTER — Telehealth (HOSPITAL_COMMUNITY): Payer: Self-pay

## 2016-09-05 ENCOUNTER — Other Ambulatory Visit (HOSPITAL_COMMUNITY): Payer: Self-pay

## 2016-09-05 ENCOUNTER — Ambulatory Visit (HOSPITAL_COMMUNITY)
Admission: RE | Admit: 2016-09-05 | Discharge: 2016-09-05 | Disposition: A | Discharge status: Home / Self Care | Payer: Medicare Other | Source: Ambulatory Visit | Attending: Cardiology | Admitting: Cardiology

## 2016-09-05 DIAGNOSIS — I5022 Chronic systolic (congestive) heart failure: Secondary | ICD-10-CM | POA: Insufficient documentation

## 2016-09-05 LAB — BASIC METABOLIC PANEL
Anion gap: 9 (ref 5–15)
BUN: 26 mg/dL — AB (ref 6–20)
CO2: 26 mmol/L (ref 22–32)
CREATININE: 1.39 mg/dL — AB (ref 0.61–1.24)
Calcium: 10.5 mg/dL — ABNORMAL HIGH (ref 8.9–10.3)
Chloride: 101 mmol/L (ref 101–111)
GFR calc Af Amer: 57 mL/min — ABNORMAL LOW (ref >60)
GFR, EST NON AFRICAN AMERICAN: 49 mL/min — AB (ref >60)
Glucose, Bld: 281 mg/dL — ABNORMAL HIGH (ref 65–99)
Potassium: 5.6 mmol/L — ABNORMAL HIGH (ref 3.5–5.1)
SODIUM: 136 mmol/L (ref 135–145)

## 2016-09-05 NOTE — Telephone Encounter (Signed)
Basic metabolic panel  Order: [id number]  Status:  Final result Visible to patient:  No (Not Released) Dx:  Systolic CHF, chronic ([place])  Notes Recorded by Effie Berkshire, RN on 09/05/2016 at 4:52 PM EDT Patient made aware, lab apt made for Monday morning to recheck BMET  ------  Notes Recorded by Conrad [place], NP on 09/05/2016 at 4:43 PM EDT Potassium elevated. Please call stop spiro and potassium. Repeat BMET next week. Please call.

## 2016-09-08 ENCOUNTER — Ambulatory Visit (HOSPITAL_COMMUNITY)
Admission: RE | Admit: 2016-09-08 | Discharge: 2016-09-08 | Disposition: A | Discharge status: Home / Self Care | Payer: Medicare Other | Source: Ambulatory Visit | Attending: Internal Medicine | Admitting: Internal Medicine

## 2016-09-08 DIAGNOSIS — I5022 Chronic systolic (congestive) heart failure: Secondary | ICD-10-CM | POA: Diagnosis not present

## 2016-09-08 LAB — BASIC METABOLIC PANEL
Anion gap: 10 (ref 5–15)
BUN: 26 mg/dL — AB (ref 6–20)
CALCIUM: 9.9 mg/dL (ref 8.9–10.3)
CHLORIDE: 98 mmol/L — AB (ref 101–111)
CO2: 26 mmol/L (ref 22–32)
CREATININE: 1.36 mg/dL — AB (ref 0.61–1.24)
GFR calc non Af Amer: 50 mL/min — ABNORMAL LOW (ref >60)
GFR, EST AFRICAN AMERICAN: 58 mL/min — AB (ref >60)
Glucose, Bld: 271 mg/dL — ABNORMAL HIGH (ref 65–99)
Potassium: 4.9 mmol/L (ref 3.5–5.1)
SODIUM: 134 mmol/L — AB (ref 135–145)

## 2016-09-11 ENCOUNTER — Telehealth (HOSPITAL_COMMUNITY): Payer: Self-pay

## 2016-09-11 NOTE — Telephone Encounter (Signed)
Patient called to clarify medications that we told patient to stop the other day via phone after lab results were reviewed by Dr. Aundra Dubin. Advised to stop spironolactone and potassium. Patient states he does not have spironolactone on his list, and that he stopped potassium as previously instructed. Renee Pain, RN

## 2016-09-12 ENCOUNTER — Other Ambulatory Visit: Payer: Self-pay

## 2016-09-12 DIAGNOSIS — Z01810 Encounter for preprocedural cardiovascular examination: Secondary | ICD-10-CM

## 2016-09-12 DIAGNOSIS — Z006 Encounter for examination for normal comparison and control in clinical research program: Secondary | ICD-10-CM

## 2016-09-12 DIAGNOSIS — I5031 Acute diastolic (congestive) heart failure: Secondary | ICD-10-CM

## 2016-09-12 NOTE — Addendum Note (Signed)
Addended by: Marlana Salvage on: 09/12/2016 12:40 PM   Modules accepted: Orders

## 2016-09-12 NOTE — Progress Notes (Signed)
Patient is present to screen for BeAT-HF Study. Patient took consent home with him on last clinic visit. He states he read it closely. Questions answered. Wife is present and has no further questions. Patient verbalizes he is aware that he must continue all medications as directed by physician. All questions answered and no procedures performed before signing of study consent. Consent signed, wife present and very supportive. Vitals, weight and core lab obtained. Upon assessing medications patient states he has not had or made any changes in his medications since 08-11-16. He states he states he was not aware that he should be taking Spironolactone, when the heart failure nurse called yesterday to stop it he had not started it. Reviewed medications, patient has good recall and indications for medications. Patient will be called with appointments for screening echocardiogram and carotid duplex dopplers.

## 2016-09-16 ENCOUNTER — Encounter: Payer: Self-pay | Admitting: Surgery

## 2016-09-17 ENCOUNTER — Encounter: Payer: Self-pay | Admitting: Surgery

## 2016-09-17 ENCOUNTER — Ambulatory Visit (INDEPENDENT_AMBULATORY_CARE_PROVIDER_SITE_OTHER): Payer: Medicare Other | Admitting: Surgery

## 2016-09-17 ENCOUNTER — Other Ambulatory Visit: Payer: Self-pay

## 2016-09-17 ENCOUNTER — Ambulatory Visit (HOSPITAL_COMMUNITY)
Admission: RE | Admit: 2016-09-17 | Discharge: 2016-09-17 | Disposition: A | Discharge status: Home / Self Care | Payer: Medicare Other | Source: Ambulatory Visit | Attending: Surgery | Admitting: Surgery

## 2016-09-17 VITALS — BP 115/85 | HR 83 | Temp 97.1°F | Resp 16 | Ht 70.0 in | Wt 232.0 lb

## 2016-09-17 DIAGNOSIS — I2510 Atherosclerotic heart disease of native coronary artery without angina pectoris: Secondary | ICD-10-CM | POA: Insufficient documentation

## 2016-09-17 DIAGNOSIS — I340 Nonrheumatic mitral (valve) insufficiency: Secondary | ICD-10-CM | POA: Insufficient documentation

## 2016-09-17 DIAGNOSIS — I517 Cardiomegaly: Secondary | ICD-10-CM | POA: Insufficient documentation

## 2016-09-17 DIAGNOSIS — I071 Rheumatic tricuspid insufficiency: Secondary | ICD-10-CM | POA: Insufficient documentation

## 2016-09-17 DIAGNOSIS — Z006 Encounter for examination for normal comparison and control in clinical research program: Secondary | ICD-10-CM | POA: Diagnosis not present

## 2016-09-17 DIAGNOSIS — Z01810 Encounter for preprocedural cardiovascular examination: Secondary | ICD-10-CM | POA: Insufficient documentation

## 2016-09-17 DIAGNOSIS — I429 Cardiomyopathy, unspecified: Secondary | ICD-10-CM | POA: Insufficient documentation

## 2016-09-17 DIAGNOSIS — I5031 Acute diastolic (congestive) heart failure: Secondary | ICD-10-CM | POA: Insufficient documentation

## 2016-09-17 DIAGNOSIS — I5020 Unspecified systolic (congestive) heart failure: Secondary | ICD-10-CM

## 2016-09-17 DIAGNOSIS — I5189 Other ill-defined heart diseases: Secondary | ICD-10-CM | POA: Insufficient documentation

## 2016-09-17 LAB — VAS US CAROTID
LCCADDIAS: -18 cm/s
LCCAPSYS: 101 cm/s
LEFT ECA DIAS: -13 cm/s
LICADSYS: -82 cm/s
LICAPDIAS: -27 cm/s
LICAPSYS: -67 cm/s
Left CCA dist sys: -75 cm/s
Left CCA prox dias: 22 cm/s
Left ICA dist dias: -25 cm/s
RIGHT CCA MID DIAS: -15 cm/s
RIGHT ECA DIAS: 8 cm/s
Right CCA prox dias: 17 cm/s
Right CCA prox sys: 92 cm/s
Right cca dist sys: -62 cm/s

## 2016-09-17 NOTE — Progress Notes (Signed)
[facility]  Patient name: Wayne [name] MRN: [id number] DOB: 1941/03/25 Sex: male  REFERRING PHYSICIAN: Dr. Marigene Ehlers  REASON FOR CONSULT: Barostim Neo he is a former smoker.  He is on an ARB for hypertension.  HPI: Wayne [name] is a 75 y.o. male, who is referred today for dilation for a Barostim Neo device.  Patient has a history of congestive heart failure with an ejection fraction in the 20-25 percent category.  He also suffers from coronary artery disease and ischemic cardiomyopathy.  He has undergone stenting in the past.  He has a history of statin intolerance.he is a former smoker.  He is on an ARB for hypertension.  He has a history of a hematoma in his left leg which required operative drainage by Dr. Sharol Given.  This did get infected.  He has not had any further infectious issues since 2012.  Patient is a type II diabetic.  His most recent hemoglobin A1c was 8.7.  Past Medical History:  Diagnosis Date  . Adenocarcinoma of prostate ([place])    s/p seed implants  . Arthritis   . Cellulitis of left leg    a. 0000000 complicated by septic shock  . Chronic combined systolic and diastolic CHF (congestive heart failure) ([place])   . Colon polyps   . CORONARY ATHEROSCLEROSIS NATIVE CORONARY ARTERY    a. 01/2011 Cath/PCI: LM nl, LAD 40-50p, D1 80-small, LCX 95-small, RI 90, RCA 100, EF 20%;  b. 01/2011 Card MRI - No transmural scar;  c. 01/2011 PCI RCA->5 Promus DES, RI->3.0x16 Promus DES; d. Cath 01/13/13 patent LAD & Ramus, diffuse LCx dz, RCA mult overlapping stents w/ 95% osital stenosis, EF 20%, s/p DES to ostial/prox RCA 01/24/13   . Diabetes mellitus, type 2 ([place])   . GERD (gastroesophageal reflux disease)   . Hematoma of leg    a. left leg hematoma 03/2012 in the setting of asa/effient  . Herpes zoster ophthalmicus   . HYPERLIPIDEMIA    intolerant to Lipitor (myalgias)  . HYPERTENSION   . Ischemic cardiomyopathy    a. 01/2012 Echo  EF 45%, mild LVH; b. [id number], grade 1 diastolic dysfunction, diffuse hypokinesis, inferoposterior akinesis   . Noncompliance   . Obesity   . OSA (obstructive sleep apnea)   . Polymyalgia rheumatica (H[facility])     Family History  Problem Relation Age of Onset  . Cancer Mother 54    unknown CA  . Heart disease Father   . Alcohol abuse Father   . Heart attack Father 65    SOCIAL HISTORY: Social History   Social History  . Marital status: Married    Spouse name: N/A  . Number of children: 1  . Years of education: N/A   Occupational History  . 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 Smoking status: Former Smoker    Packs/day: 0.30    Years: 20.00    Types: Cigarettes    Quit date: 02/23/1989  . Smokeless tobacco: Never Used  . Alcohol use No  . Drug use: No  . Sexual activity: Not on file   Other Topics Concern  . Not on file   Social History Narrative   Retired Administrator    Allergies  Allergen Reactions  . Plavix [Clopidogrel]     Nose bleeds, swellings, whelps on legs & back, itching  . Lipitor [Atorvastatin] Other (See Comments)    REACTION: sore legs  .  Crestor [Rosuvastatin Calcium] Other (See Comments)    Myalgias, Interfering with Gait  . Entresto [Sacubitril-Valsartan]     Angioedema  . Ancef [Cefazolin] Rash    Current Outpatient Prescriptions  Medication Sig Dispense Refill  . aspirin EC 81 MG tablet Take 81 mg by mouth daily.    . furosemide (LASIX) 20 MG tablet Take 3 tabs in AM and 2 tabs in PM 150 tablet 3  . losartan (COZAAR) 25 MG tablet Take 0.5 tablets (12.5 mg total) by mouth daily. 15 tablet 3  . metoprolol succinate (TOPROL XL) 25 MG 24 hr tablet Take 1 tablet (25 mg total) by mouth daily. 30 tablet 6  . Multiple Vitamins-Minerals (CENTRUM SILVER ADULT 50+) TABS Take 1 tablet by mouth daily.    . rosuvastatin (CRESTOR) 5 MG tablet Take 1 tablet (5 mg total) by mouth daily. 30 tablet 6   No current  facility-administered medications for this visit.     REVIEW OF SYSTEMS:  [X]  denotes positive finding, [ ]  denotes negative finding Cardiac  Comments:  Chest pain or chest pressure:    Shortness of breath upon exertion: x   Short of breath when lying flat:    Irregular heart rhythm:        Vascular    Pain in calf, thigh, or hip brought on by ambulation: x   Pain in feet at night that wakes you up from your sleep:     Blood clot in your veins:    Leg swelling:  x       Pulmonary    Oxygen at home:    Productive cough:     Wheezing:         Neurologic    Sudden weakness in arms or legs:     Sudden numbness in arms or legs:     Sudden onset of difficulty speaking or slurred speech:    Temporary loss of vision in one eye:     Problems with dizziness:         Gastrointestinal    Blood in stool:     Vomited blood:         Genitourinary    Burning when urinating:     Blood in urine:        Psychiatric    Major depression:         Hematologic    Bleeding problems:    Problems with blood clotting too easily:        Skin    Rashes or ulcers:        Constitutional    Fever or chills:      PHYSICAL EXAM: Vitals:   09/17/16 1327  BP: 115/85  Pulse: 83  Resp: 16  Temp: 97.1 F (36.2 C)  TempSrc: Oral  SpO2: 97%  Weight: 232 lb (105.2 kg)  Height: 5\' 10"  (1.778 m)    GENERAL: The patient is a well-nourished male, in no acute distress. The vital signs are documented above. CARDIAC: There is a regular rate and rhythm.  VASCULAR: No carotid bruits. PULMONARY: There is good air exchange bilaterally without wheezing or rales. MUSCULOSKELETAL: There are no major deformities or cyanosis. NEUROLOGIC: No focal weakness or paresthesias are detected. SKIN: There are no ulcers or rashes noted. PSYCHIATRIC: The patient has a normal affect.  DATA:  Carotid duplex was ordered and reviewed today.  He has no significant carotid stenosis and bilateral carotid arteries.   The bifurcation is noted to be in the  mid neck.  ASSESSMENT AND PLAN: Heart failure: I feel that the patient is a good candidate for a Barostim Neo vice.  I discussed the details and risks of the procedure as well as the incisions required.  All of his questions were answered.  He will go on for randomization and surgery will be scheduled based on randomization.   Annamarie Major, MD [facility] [phone] Pager [phone]

## 2016-09-18 ENCOUNTER — Ambulatory Visit (HOSPITAL_BASED_OUTPATIENT_CLINIC_OR_DEPARTMENT_OTHER)
Admission: RE | Admit: 2016-09-18 | Discharge: 2016-09-18 | Disposition: A | Discharge status: Home / Self Care | Payer: Medicare Other | Source: Ambulatory Visit | Attending: Cardiology | Admitting: Cardiology

## 2016-09-18 DIAGNOSIS — Z006 Encounter for examination for normal comparison and control in clinical research program: Secondary | ICD-10-CM

## 2016-09-18 DIAGNOSIS — I5031 Acute diastolic (congestive) heart failure: Secondary | ICD-10-CM

## 2016-09-18 NOTE — Progress Notes (Signed)
Echocardiogram 2D Echocardiogram has been performed.  Aggie Cosier 09/18/2016, 9:44 AM

## 2016-09-19 ENCOUNTER — Other Ambulatory Visit (HOSPITAL_COMMUNITY): Payer: Medicare Other

## 2016-09-23 DIAGNOSIS — Z006 Encounter for examination for normal comparison and control in clinical research program: Secondary | ICD-10-CM

## 2016-09-23 NOTE — Progress Notes (Signed)
Patient present for BeAT-HF Baseline Visit. He states he is doing well, he did verbalize eating a little too much salt over the weekend, he states "it's not worth it, and I'm learning, and I know I need to take an extra fluid pill." He states he will call HF Clinic if needed. No distress, very pleasant. Does verbalize some mild depression with thoughts of Heart Failure. Reassured. States he is thinking about possible ICD implant, but declining for now. Verbalized "I am happy for the visits here as I am learning a lot about heart failure and I want to start cardiac rehab." Study questionnaires and 6 Minute Hall Walk completed without problem. Writer will enter data and call patient later today regarding randomization.

## 2016-09-24 ENCOUNTER — Telehealth: Payer: Self-pay

## 2016-09-24 NOTE — Telephone Encounter (Signed)
Call to patient to discuss Randomization to Control/Medical Management Arm of BeAT-HF Study.   Patient states he feels good today as he feels he is making better food choices. Writer also spoke to patient at length about his blood sugars and need to follow up with Dr. Elease Hashimoto (his PCP). Patient states he will call and make an appointment as he understands the implications of high blood sugars. Very pleasant and states he is really trying to be "a believer" with the medications ordered. Patient request order for Cardiac Rehab here on [facility].  Reassured. Will follow up tomorrow regarding items discussed.

## 2016-09-24 NOTE — Telephone Encounter (Signed)
Please order the cardiac rehab referral.

## 2016-09-24 NOTE — Addendum Note (Signed)
Addended by: Scarlette Calico on: 09/24/2016 02:06 PM   Modules accepted: Orders

## 2016-09-24 NOTE — Telephone Encounter (Signed)
Pt calling to check the status to see if he needs to make a appointment to see Dr. Elease Hashimoto for his high blood sugars.

## 2016-09-24 NOTE — Telephone Encounter (Signed)
Order placed for cardiac rehab.

## 2016-09-25 NOTE — Telephone Encounter (Signed)
Pt is aware of annotations and scheduled for tomorrow.

## 2016-09-25 NOTE — Telephone Encounter (Signed)
There are so many issues to discuss in choosing medications beyond metformin that I would recommend office follow up first.

## 2016-09-25 NOTE — Telephone Encounter (Signed)
A1C last checked on 07/14/2016 with a reading of 8.7. Pt would like to know if he can start taking a medication besides metformin then schedule a follow up when its time to recheck A1C. Please advise. I also have offered him a blood glucose meter so he can keep a records of his readings and he was agreeable.

## 2016-09-26 ENCOUNTER — Ambulatory Visit (HOSPITAL_COMMUNITY)
Admission: RE | Admit: 2016-09-26 | Discharge: 2016-09-26 | Disposition: A | Discharge status: Home / Self Care | Payer: Medicare Other | Source: Ambulatory Visit | Attending: Cardiology | Admitting: Cardiology

## 2016-09-26 ENCOUNTER — Telehealth (HOSPITAL_COMMUNITY): Payer: Self-pay | Admitting: Vascular Surgery

## 2016-09-26 VITALS — BP 132/90 | HR 98 | Wt 233.0 lb

## 2016-09-26 DIAGNOSIS — I5022 Chronic systolic (congestive) heart failure: Secondary | ICD-10-CM | POA: Insufficient documentation

## 2016-09-26 DIAGNOSIS — I110 Hypertensive heart disease with heart failure: Secondary | ICD-10-CM | POA: Diagnosis not present

## 2016-09-26 DIAGNOSIS — I2510 Atherosclerotic heart disease of native coronary artery without angina pectoris: Secondary | ICD-10-CM | POA: Diagnosis not present

## 2016-09-26 DIAGNOSIS — Z87891 Personal history of nicotine dependence: Secondary | ICD-10-CM | POA: Diagnosis not present

## 2016-09-26 DIAGNOSIS — E7800 Pure hypercholesterolemia, unspecified: Secondary | ICD-10-CM | POA: Diagnosis not present

## 2016-09-26 DIAGNOSIS — Z888 Allergy status to other drugs, medicaments and biological substances status: Secondary | ICD-10-CM | POA: Diagnosis not present

## 2016-09-26 DIAGNOSIS — E1165 Type 2 diabetes mellitus with hyperglycemia: Secondary | ICD-10-CM | POA: Insufficient documentation

## 2016-09-26 DIAGNOSIS — I255 Ischemic cardiomyopathy: Secondary | ICD-10-CM | POA: Insufficient documentation

## 2016-09-26 DIAGNOSIS — Z6833 Body mass index (BMI) 33.0-33.9, adult: Secondary | ICD-10-CM | POA: Insufficient documentation

## 2016-09-26 DIAGNOSIS — Z79899 Other long term (current) drug therapy: Secondary | ICD-10-CM | POA: Insufficient documentation

## 2016-09-26 DIAGNOSIS — Z7982 Long term (current) use of aspirin: Secondary | ICD-10-CM | POA: Diagnosis not present

## 2016-09-26 DIAGNOSIS — I739 Peripheral vascular disease, unspecified: Secondary | ICD-10-CM | POA: Insufficient documentation

## 2016-09-26 DIAGNOSIS — E669 Obesity, unspecified: Secondary | ICD-10-CM | POA: Insufficient documentation

## 2016-09-26 DIAGNOSIS — E785 Hyperlipidemia, unspecified: Secondary | ICD-10-CM | POA: Insufficient documentation

## 2016-09-26 LAB — BASIC METABOLIC PANEL
ANION GAP: 11 (ref 5–15)
BUN: 14 mg/dL (ref 6–20)
CALCIUM: 9.9 mg/dL (ref 8.9–10.3)
CO2: 30 mmol/L (ref 22–32)
Chloride: 99 mmol/L — ABNORMAL LOW (ref 101–111)
Creatinine, Ser: 1.2 mg/dL (ref 0.61–1.24)
GFR calc Af Amer: >60 mL/min (ref >60)
GFR, EST NON AFRICAN AMERICAN: 59 mL/min — AB (ref >60)
GLUCOSE: 248 mg/dL — AB (ref 65–99)
Potassium: 3.7 mmol/L (ref 3.5–5.1)
SODIUM: 140 mmol/L (ref 135–145)

## 2016-09-26 MED ORDER — LOSARTAN POTASSIUM 25 MG PO TABS: 12.5000 mg | ORAL_TABLET | Freq: Two times a day (BID) | ORAL | 3 refills | Status: DC

## 2016-09-26 MED ORDER — FUROSEMIDE 20 MG PO TABS: 60.0000 mg | ORAL_TABLET | Freq: Two times a day (BID) | ORAL | 3 refills | Status: DC

## 2016-09-26 MED ORDER — METOPROLOL SUCCINATE ER 25 MG PO TB24: 25.0000 mg | ORAL_TABLET | Freq: Two times a day (BID) | ORAL | 6 refills | Status: DC

## 2016-09-26 NOTE — Telephone Encounter (Signed)
Sent in basket message to cvd scheduling to call pt to schedule Lipid clinic

## 2016-09-26 NOTE — Patient Instructions (Signed)
Increase Furosemide to 60 mg (3 tabs) Twice daily   Increase Losartan to 12.5 mg Twice daily   Increase Metoprolol 25 mg Twice daily   Labs today  Labs in 10 days  You have been referred to [facility] physician has recommended that you have a cardiopulmonary stress test (CPX). CPX testing is a non-invasive measurement of heart and lung function. It replaces a traditional treadmill stress test. This type of test provides a tremendous amount of information that relates not only to your present condition but also for future outcomes. This test combines measurements of you ventilation, respiratory gas exchange in the lungs, electrocardiogram (EKG), blood pressure and physical response before, during, and following an exercise protocol.  Your physician recommends that you schedule a follow-up appointment in: 1 month

## 2016-09-28 NOTE — Progress Notes (Signed)
Patient ID: Wayne [name], male   DOB: November 22, 1941, 75 y.o.   MRN: [id number]   Advanced Heart Failure Clinic Note   Patient ID: Wayne [name], male   DOB: [birth year], 75 y.o.   MRN: [id number] PCP: Dr. Elease Hashimoto Cardiology: Dr. Aundra Dubin  75 yo with history of DM, HTN, CAD, and ischemic cardiomyopathy presents for cardiology followup. Patient had a CHF exacerbation in 2/12 and was found to have LV systolic dysfunction with EF around 20%. LHC showed RCA, ramus, and CFX disease. RCA was subtotally occluded. Cardiac MRI showed that all walls, including the inferior wall, should be viable. Patient therefore underwent opening of his chronic totally occluded RCA as well as PCI to the ramus in 2/12. He received drug eluting stents and was on Effient.  He had an echo in 2/13 that showed EF improved to 45% with moderate LV dilation and mild LV hypertrophy.   In 5/13, he developed a large left lower leg hematoma.  He was still on Effient at that time.  ASA and Effient were stopped.  The hematoma did not resolve.  In 7/13, he was re-admitted with septic shock from MSSA from an abscess in his left gastrocnemius.  He also grew Pseudomonas from the left gastrocnemius as well.  He had a prolonged course in the hospital and later in a rehab facility.  Ultimately, he got back home again.  I had him get an echo in 1/14, and this showed EF 15% with diffuse hypokinesis and inferoposterior akinesis.  He had been off of most of his prior cardiac medications.  I took him back for [facility] in 1/14.  This showed subtotal occlusion of a small AV LCx and 95% ostial in-stent restenosis in the RCA.  He was treated in 2/14 with a Xience DES to the ostial RCA and begun on Plavix.  Unfortunately, he developed diffuse hives after starting Plavix and had to be switched to ticlopidine.  He has tolerated ticlopidine.   Echo done in 12/14 showed some improvement in LV function but EF was still low at 30-35%.  He did not want ICD.    Presented to ED  11/14/15 with worsening SOB after a few steps and CXR with CHF and bilateral effusions in the setting of marked medical non-compliance. States he stopped taking his medicines in 01/2015 (except for ASA 81 and a multivitamin).  He was feeling good and decided that he did not need them anymore.  Also c/o URI symptoms. Diuresed over 2 L with IV diuretics in the ER and started on lasix 20 mg daily.   Echo (12/16) showed EF 20-25% with diffuse hypokinesis (down from 35% in 12/15).  Echo 10/17 showed EF 15% with mildly decreased RV systolic function.   He has had some difficulty with medication compliance.  However, he has is now in the BEAT-HF trial medical arm and visits with the research nurse have really helped his medication compliance. He is currently taking meds as ordered.   No chest pain.  He is short of breath with fast walking but generally does ok on flat ground. Mild dyspnea with stairs.  No orthopnea/PND.  Weight had gone up recently, and he increased Lasix to 60 mg bid a couple of days ago.  Weight is actually stable compared to prior appointment. He is now off spironolactone due to elevated K.   Labs (12/16): K 4.9, creatinine 1.23 Labs (1/17): K 4.7, creatinine 1.15, BNP 1433 Labs (2/17): K 4.7, creatinine 1.14, BNP 870 Labs (  3/17): K 4.5, creatinine 1.10, BNP 1257 Labs (5/17): K 4.2, creatinine 1.2, BNP 818 Labs (7/17): LDL 149, HDL 40, TGs 210 Labs (9/17): K 4.9, creatinine 1.36  Past Medical History:  1. HYPERTENSION  2. HYPERLIPIDEMIA: Myalgias with atorvastatin and Crestor 3. ECZEMA  4. RHINITIS  5. HERPES ZOSTER OPHTHALMICUS  6. ADENOCARCINOMA, PROSTATE: Status post prostatectomy in 2009. Has had some incontinence since then.  7. Diabetes mellitus type II  8. Arthritis  9. Obesity  10. GERD: rare  11. CAD: Presented with exertional dyspnea, never had chest pain. LHC (2/12) with subtotalled proximal RCA and left to right collaterals, 90% proximal moderate-sized ramus, 95%  proximal relatively small CFX, 40-50% proximal LAD. Cardiac MRI (2/12) showed EF 21%, some mild scar in basal segments but all wall segments would be expected to be viable. DES x 5 (overlapping) to RCA, DES x 1 to RI 01/30/11 .  Bled into leg with Effient use (long, complicated course).  LHC (1/14): AV LCx small with subtotal occlusion, 95% ostial instent restenosis in RCA. PCI in 2/14 to ostial RCA with 3.5 x 15 Xience DES.  Plavix allergy (hives) so put on ticlopidine.  12. Ischemic CMP: Echo (2/12) with moderately dilated LV, EF about 20% with diffuse hypokinesis and inferior akinesis, pseudonormal diastolic function, mild MR, severe LAE, mildly decreased RV systolic function. RHC (2/12) with mean RA 12, PA 40/25, mean PCWP 26, CI 2.1.  Echo (5/12) with EF 40% (appeared worse to my eye) with posterior HK, basal inferior AK, inferoseptal AK, basal anteroseptal AK, mild MR.  Cardiac MRI was repeated and showed EF 32% (improved from 21%) and mild LV dilation (was severely dilated before) with diffuse hypokinesis and subendocardial scar in the basal inferior, basal posterior, and basal anterolateral segments.  Echo (2/13) with EF 45%, moderate LV dilation, mild LVH.  Echo (1/14) with EF 15%, diffuse hypokinesis, inferoposterior akinesis, mild MR, grade I diastolic dysfunction.  Echo (5/14) with EF 30-35%, mild LV dilation, akinesis of the basal inferior wall otherwise diffuse hypokinesis.  Echo (12/14) with EF 30-35%, mild LV dilation, diffuse hypokinesis with basal inferior and posterior akinesis. Echo (12/15) with EF 35%, mildly dilated LV, wall motion abnormalities, normal RV size and systolic function. Echo (12/16) with EF 20-25%, diffuse hypokinesis, severe LV dilation, mild MR.  - Echo (10/17): EF 15%, grade II diastolic dysfunction, mild MR, mildly decreased RV systolic function.  - Possible angioedema related to Entresto.  - Hyperkalemia with spironolactone 12.5 daily.  13. Cervical OA.  14. Polymyalgia  rheumatica 15. OSA: Severe on sleep study 10/12.  On CPAP.  16. Left lower leg hematoma in setting of Effient use.  He developed a left gastrocnemius abscess and septic shock with prolonged hospitalization beginning in 7/13.  17. PAD: Lower extremity arterial doppler evaluation in 2/17 showed occluded peroneal arteries bilaterally, ABI 1.1 (R) and 1.0 (L).   18. Carotid dopplers (10/17) with minimal disease.   Family History:  Father died with MI at age 69. He was an alcoholic. Mother died with cancer at around 30.   Social History:  Occupation: retired Administrator  Married, lives in [place]  Past smoker, quit around [place]: all systems reviewed and negative except as per HPI.    Current Outpatient Prescriptions  Medication Sig Dispense Refill  . aspirin EC 81 MG tablet Take 81 mg by mouth daily.    . furosemide (LASIX) 20 MG tablet Take 3 tablets (60 mg total) by mouth 2 (two)  times daily. 180 tablet 3  . losartan (COZAAR) 25 MG tablet Take 0.5 tablets (12.5 mg total) by mouth 2 (two) times daily. 30 tablet 3  . metoprolol succinate (TOPROL XL) 25 MG 24 hr tablet Take 1 tablet (25 mg total) by mouth 2 (two) times daily. 60 tablet 6  . Multiple Vitamins-Minerals (CENTRUM SILVER ADULT 50+) TABS Take 1 tablet by mouth daily.    . rosuvastatin (CRESTOR) 5 MG tablet Take 1 tablet (5 mg total) by mouth daily. 30 tablet 6   No current facility-administered medications for this encounter.     BP 132/90 (BP Location: Left Arm, Patient Position: Sitting, Cuff Size: Normal)   Pulse 98   Wt 233 lb (105.7 kg)   SpO2 95%   BMI 33.43 kg/m    Wt Readings from Last 3 Encounters:  09/26/16 233 lb (105.7 kg)  09/23/16 232 lb 6.4 oz (105.4 kg)  09/17/16 232 lb (105.2 kg)    General: NAD, obese.  Neck: Thick, JVP 8 cm, no thyromegaly or thyroid nodule. No carotid bruit.  HEENT: Normal.  Lungs: Bibasilar crackles CV: Nondisplaced PMI. Heart regular S1/S2, no S3/S4, no murmur.  Difficult to palpate pedal pulses.  Abdomen: Soft, NT, mild/moderate distention, no HSM. No bruits or masses. +BS  Neurologic: Alert and oriented x 3.  Psych: Pleasant affect.  Extremities: No clubbing or cyanosis. No edema.  Assessment/Plan:  1. Chronic systolic CHF: Ischemic cardiomyopathy.  10/17 echo with EF 15%, diffuse hypokinesis.  NYHA class II-III symptoms.  He looks near-euvolemic on exam today.  Lasix recently increased.  Doing better with medication compliance.  - Continue Lasix 60 mg bid.  BMET today.  - Off spironolactone with elevated K.  - Increase Toprol XL to 25 mg bid and increase losartan to 12.5 mg bid.  BMET in 10 days.     - Of note, patient had possible angioedema related to [facility]. - We discussed ICD again today.  He wants to think more about it.  He would not be a CRT candidate with narrow QRS.  - I am going to arrange for CPX.  I am concerned that his HF is worsening over time.  He may be an LVAD candidate in the future if he continues to worsen.  As above, compliance is improving.   - I will refer him to cardiac rehab.  2. CAD: No CP.  Last PCI 01/2013. Completed his year-long course of ticlopidine.  He was allergic to Plavix and had severe gastrocnemius bleed with Effient. - Continue ASA 81 and Crestor 5 daily. 3. HLD: Has had myalgias with Crestor and atorvastatin. He has not been able to increase Crestor above 5 mg daily and LDL remains high (149 in 7/17).  I think he would be a good candidate for Repatha.  I am referring him to lipid clinic to get evaluated for Repatha. 4. PAD: Occluded peroneal arteries bilaterally.  No significant claudication.  5. Hyperglycemia: He is going to followup about this with Dr Elease Hashimoto.  If he needs a diabetes medication, empagliflozin would be a good choice given its positive cardiovascular effects.   Followup in 1 month.    Loralie Champagne  09/28/2016

## 2016-09-29 ENCOUNTER — Other Ambulatory Visit (HOSPITAL_COMMUNITY): Payer: Self-pay | Admitting: *Deleted

## 2016-09-29 DIAGNOSIS — I5022 Chronic systolic (congestive) heart failure: Secondary | ICD-10-CM

## 2016-09-29 MED ORDER — FUROSEMIDE 20 MG PO TABS: 60.0000 mg | ORAL_TABLET | Freq: Two times a day (BID) | ORAL | 3 refills | Status: DC

## 2016-09-30 ENCOUNTER — Inpatient Hospital Stay (HOSPITAL_COMMUNITY): Admission: RE | Admit: 2016-09-30 | Payer: Medicare Other | Source: Ambulatory Visit

## 2016-09-30 ENCOUNTER — Encounter: Payer: Self-pay | Admitting: Family Medicine

## 2016-09-30 ENCOUNTER — Ambulatory Visit (INDEPENDENT_AMBULATORY_CARE_PROVIDER_SITE_OTHER): Payer: Medicare Other | Admitting: Family Medicine

## 2016-09-30 VITALS — BP 100/62 | HR 85 | Temp 98.1°F | Ht 70.0 in | Wt 234.7 lb

## 2016-09-30 DIAGNOSIS — E1140 Type 2 diabetes mellitus with diabetic neuropathy, unspecified: Secondary | ICD-10-CM | POA: Insufficient documentation

## 2016-09-30 DIAGNOSIS — Z23 Encounter for immunization: Secondary | ICD-10-CM | POA: Diagnosis not present

## 2016-09-30 DIAGNOSIS — E118 Type 2 diabetes mellitus with unspecified complications: Secondary | ICD-10-CM

## 2016-09-30 DIAGNOSIS — I5022 Chronic systolic (congestive) heart failure: Secondary | ICD-10-CM | POA: Diagnosis not present

## 2016-09-30 DIAGNOSIS — N183 Chronic kidney disease, stage 3 unspecified: Secondary | ICD-10-CM | POA: Insufficient documentation

## 2016-09-30 DIAGNOSIS — I2510 Atherosclerotic heart disease of native coronary artery without angina pectoris: Secondary | ICD-10-CM | POA: Diagnosis not present

## 2016-09-30 DIAGNOSIS — E1165 Type 2 diabetes mellitus with hyperglycemia: Secondary | ICD-10-CM | POA: Diagnosis not present

## 2016-09-30 DIAGNOSIS — IMO0002 Reserved for concepts with insufficient information to code with codable children: Secondary | ICD-10-CM

## 2016-09-30 MED ORDER — ACCU-CHEK SOFT TOUCH LANCETS MISC: 5 refills | Status: DC

## 2016-09-30 MED ORDER — GLUCOSE BLOOD VI STRP: ORAL_STRIP | 5 refills | Status: DC

## 2016-09-30 NOTE — Patient Instructions (Signed)
Check on Coverage for Jardiance and dose would be 10 mg once daily Can go to [email or web address]

## 2016-09-30 NOTE — Progress Notes (Signed)
Subjective:     Patient ID: Wayne [name], male   DOB: [birth year], 75 y.o.   MRN: [id number]  HPI Patient is here to discuss type 2 diabetes. He has no history of type 2 diabetes and very poor follow-up-regarding his diabetes. He has history of CAD, systolic heart failure, hypertension, type 2 diabetes, hyperlipidemia.  He had A1c couple months ago 8.7%. Previously took metformin but had complicated staph bacteremia and was taken off several years ago. Has not been on any diabetes medications since then. He does not monitor blood sugars at home. No polyuria or polydipsia.  He does have some renal insufficiency but recent creatinine 1.2 with GFR around 59-60. Cardiologist had suggested SGLT-2 drug with Jardiance-given recent positive cardiovascular outcomes. Cost is a concern for patient as he pays out of pocket for medications  Past Medical History:  Diagnosis Date  . Adenocarcinoma of prostate ([place])    s/p seed implants  . Arthritis   . Cellulitis of left leg    a. 0000000 complicated by septic shock  . Chronic combined systolic and diastolic CHF (congestive heart failure) ([place])   . Colon polyps   . CORONARY ATHEROSCLEROSIS NATIVE CORONARY ARTERY    a. 01/2011 Cath/PCI: LM nl, LAD 40-50p, D1 80-small, LCX 95-small, RI 90, RCA 100, EF 20%;  b. 01/2011 Card MRI - No transmural scar;  c. 01/2011 PCI RCA->5 Promus DES, RI->3.0x16 Promus DES; d. Cath 01/13/13 patent LAD & Ramus, diffuse LCx dz, RCA mult overlapping stents w/ 95% osital stenosis, EF 20%, s/p DES to ostial/prox RCA 01/24/13   . Diabetes mellitus, type 2 ([place])   . GERD (gastroesophageal reflux disease)   . Hematoma of leg    a. left leg hematoma 03/2012 in the setting of asa/effient  . Herpes zoster ophthalmicus   . HYPERLIPIDEMIA    intolerant to Lipitor (myalgias)  . HYPERTENSION   . Ischemic cardiomyopathy    a. 01/2012 Echo EF 45%, mild LVH; b. A999333, grade 1 diastolic dysfunction, diffuse hypokinesis, inferoposterior akinesis   .  Noncompliance   . Obesity   . OSA (obstructive sleep apnea)   . Polymyalgia rheumatica (H[facility])    Past Surgical History:  Procedure Laterality Date  . CARDIAC CATHETERIZATION  01/24/2013  . CORONARY ANGIOPLASTY WITH STENT PLACEMENT  01/24/2013   DES to RCA  . CORONARY STENT PLACEMENT  2012   reports 6 stents placed  . I&D EXTREMITY  06/15/2012   Procedure: IRRIGATION AND DEBRIDEMENT EXTREMITY;  Surgeon: Newt Minion, MD;  Location: [facility];  Service: Orthopedics;  Laterality: Left;  I&D Left Posterior Knee  . I&D EXTREMITY  06/30/2012   Procedure: IRRIGATION AND DEBRIDEMENT EXTREMITY;  Surgeon: Newt Minion, MD;  Location: [facility];  Service: Orthopedics;  Laterality: Left;  Left Leg Irrigation and Debridement and placement of Wound VAC and application of  A-cell  . I&D EXTREMITY  07/20/2012   Procedure: IRRIGATION AND DEBRIDEMENT EXTREMITY;  Surgeon: Newt Minion, MD;  Location: [facility];  Service: Orthopedics;  Laterality: Left;  Irrigation and Debridement Left Leg and Place antibiotic beads   . PERCUTANEOUS CORONARY STENT INTERVENTION (PCI-S) N/A 01/24/2013   Procedure: PERCUTANEOUS CORONARY STENT INTERVENTION (PCI-S);  Surgeon: Wellington Hampshire, MD;  Location: [facility] C[facility] LAB;  Service: Cardiovascular;  Laterality: N/A;  . PROSTATE SURGERY     cancer, seed implant    reports that he quit smoking about 27 years ago. His smoking use included Cigarettes. He has a 6.00 pack-year smoking  history. He has never used smokeless tobacco. He reports that he does not drink alcohol or use drugs. family history includes Alcohol abuse in his father; Cancer (age of onset: 70) in his mother; Heart attack (age of onset: 2) in his father; Heart disease in his father. Allergies  Allergen Reactions  . Plavix [Clopidogrel]     Nose bleeds, swellings, whelps on legs & back, itching  . Lipitor [Atorvastatin] Other (See Comments)    REACTION: sore legs  . Crestor [Rosuvastatin Calcium] Other (See Comments)    Myalgias,  Interfering with Gait  . Entresto [Sacubitril-Valsartan]     Angioedema  . Ancef [Cefazolin] Rash     Review of Systems  Constitutional: Negative for fatigue.  Eyes: Negative for visual disturbance.  Respiratory: Negative for cough and chest tightness.   Cardiovascular: Negative for chest pain, palpitations and leg swelling.  Endocrine: Negative for polydipsia and polyuria.  Neurological: Negative for dizziness, syncope, weakness, light-headedness and headaches.       Objective:   Physical Exam  Constitutional: He is oriented to person, place, and time. He appears well-developed and well-nourished.  HENT:  Right Ear: External ear normal.  Left Ear: External ear normal.  Mouth/Throat: Oropharynx is clear and moist.  Eyes: Pupils are equal, round, and reactive to light.  Neck: Neck supple. No thyromegaly present.  Cardiovascular: Normal rate and regular rhythm.   Pulmonary/Chest: Effort normal and breath sounds normal. No respiratory distress. He has no wheezes. He has no rales.  Musculoskeletal: He exhibits no edema.  Neurological: He is alert and oriented to person, place, and time.       Assessment:     #1 type 2 diabetes. History of poor compliance with follow-up and currently untreated-and poorly controlled.  #2 hypertension stable at goal  #3 history of CAD with ischemic cardiomyopathy and systolic heart failure  #4 chronic kidney disease    Plan:     -Jardiance would be good choice for him-especially with recent positive cardiovascular outcome data. He does have creatinine clearance over 45 so this is an option. Also, there is relative caution with metformin with his heart failure history. Unfortunately, cost is a limiting factor. We will look into seeing whether he can get some drug assistance and if so would start Jardiance 10 mg once daily -Home glucose monitor given -Flu vaccine given -Plan routine follow-up in 2 months to reassess  [name]  MD [facility] at [facility]

## 2016-09-30 NOTE — Progress Notes (Signed)
Pre visit review using our clinic review tool, if applicable. No additional management support is needed unless otherwise documented below in the visit note. 

## 2016-10-01 NOTE — Progress Notes (Signed)
Patient ID: Wayne [name]                 DOB: [birth year]                    MRN: [id number]     HPI: Wayne [name] is a 75 y.o. male patient referred to lipid clinic by Dr. Aundra Dubin. PMH is significant for HTN, cardiomyopathy, CAD, systolic HF with LVEF of 0000000 in 09/2016, NSTEMI, DM2 with PVD, and OSA. Patient has a history of statin intolerance and presents today for further lipid management. Of note, patient does not have medication insurance.  He is intolerant to multiple doses of pravastatin, simvastatin, atorvastatin, and rosuvastatin. He experienced myalgias with each statin that resolved upon drug discontinuation.  Most recent lipid panel was drawn in July when patient was not on any lipid lowering therapy. His LDL was 149 at that time. He started taking Crestor 5mg  daily after that, but reports that he has had hip and joint pain. It is also unaffordable at $200 per month. [employer] stopped offering patient assistance for Crestor after it went generic. He also states that his Toprol costs him $36 each month. He states his PCP is trying to start him on Jardiance and is waiting to hear from the drug company about financial assistance. This would be a helpful option due to diuretic effects, weight loss, and CV benefit.   Current Medications: Crestor 5mg  daily - joint and hip pain Intolerances: pravastatin 40mg  and 80mg  daily, simvastatin 20mg  daily, Crestor 10mg , 20mg , and 40mg  daily, atorvastatin  Risk Factors: CAD, NSTEMI, DM2, PVD, age, HTN LDL goal: 70mg /dL  Diet: Has cut back on bread and cheese. Avoids sweets. Trying to limit red meat. Does like french fries. Eats most meals at restaurants.   Exercise: Works in the yard, not much other exercise.  Family History: Father died with MI at age 26.   Social History: Former smoker - quit in 1990.   Labs: 06/2016: TC 233, TG 218, HDL 40, LDL 149 (no therapy) - started on Crestor 5mg  daily on 07/29/16  Past Medical History:  Diagnosis Date  .  Adenocarcinoma of prostate ([place])    s/p seed implants  . Arthritis   . Cellulitis of left leg    a. 0000000 complicated by septic shock  . Chronic combined systolic and diastolic CHF (congestive heart failure) ([place])   . Colon polyps   . CORONARY ATHEROSCLEROSIS NATIVE CORONARY ARTERY    a. 01/2011 Cath/PCI: LM nl, LAD 40-50p, D1 80-small, LCX 95-small, RI 90, RCA 100, EF 20%;  b. 01/2011 Card MRI - No transmural scar;  c. 01/2011 PCI RCA->5 Promus DES, RI->3.0x16 Promus DES; d. Cath 01/13/13 patent LAD & Ramus, diffuse LCx dz, RCA mult overlapping stents w/ 95% osital stenosis, EF 20%, s/p DES to ostial/prox RCA 01/24/13   . Diabetes mellitus, type 2 ([place])   . GERD (gastroesophageal reflux disease)   . Hematoma of leg    a. left leg hematoma 03/2012 in the setting of asa/effient  . Herpes zoster ophthalmicus   . HYPERLIPIDEMIA    intolerant to Lipitor (myalgias)  . HYPERTENSION   . Ischemic cardiomyopathy    a. 01/2012 Echo EF 45%, mild LVH; b. A999333, grade 1 diastolic dysfunction, diffuse hypokinesis, inferoposterior akinesis   . Noncompliance   . Obesity   . OSA (obstructive sleep apnea)   . Polymyalgia rheumatica ([place])     Current Outpatient Prescriptions on File Prior to  Visit  Medication Sig Dispense Refill  . aspirin EC 81 MG tablet Take 81 mg by mouth daily.    . furosemide (LASIX) 20 MG tablet Take 3 tablets (60 mg total) by mouth 2 (two) times daily. 540 tablet 3  . glucose blood (ACCU-CHEK AVIVA) test strip Check blood sugars 1-2 times per day. DX: E11.65 200 each 5  . Lancets (ACCU-CHEK SOFT TOUCH) lancets Check blood sugars 1-2 times per day. DX: E11.65 200 each 5  . losartan (COZAAR) 25 MG tablet Take 0.5 tablets (12.5 mg total) by mouth 2 (two) times daily. 30 tablet 3  . metoprolol succinate (TOPROL XL) 25 MG 24 hr tablet Take 1 tablet (25 mg total) by mouth 2 (two) times daily. 60 tablet 6  . Multiple Vitamins-Minerals (CENTRUM SILVER ADULT 50+) TABS Take 1 tablet by mouth  daily.    . rosuvastatin (CRESTOR) 5 MG tablet Take 1 tablet (5 mg total) by mouth daily. 30 tablet 6   No current facility-administered medications on file prior to visit.     Allergies  Allergen Reactions  . Plavix [Clopidogrel]     Nose bleeds, swellings, whelps on legs & back, itching  . Lipitor [Atorvastatin] Other (See Comments)    REACTION: sore legs  . Crestor [Rosuvastatin Calcium] Other (See Comments)    Myalgias, Interfering with Gait  . Entresto [Sacubitril-Valsartan]     Angioedema  . Ancef [Cefazolin] Rash    Assessment/Plan:  1. Hyperlipidemia - LDL 149mg /dL on no therapy above goal 70mg /dL given history of ASCVD. Since his Crestor is unaffordable at $200 per month and it is causing joint aches, will discontinue. Pt is willing to try lower dose of pravastatin 20mg  daily. Switching meds to [facility] because a 30 day supply will cost the patient $10 instead of $200 for the Crestor. Will recheck lipid panel in 2 months. If LDL still above goal, will discuss PCSK9i with patient (he has no insurance but should qualify through drug company). Zetia will not be an option because it will be cost prohibitive even as a generic and no pharmacies are offering discount pricing yet.  2. Medication costs - Of note, will also switch metoprolol succinate 25mg  BID to equivalent dose of metoprolol tartrate 25mg  BID at [facility] so that patient only has to pay $20 for a 3 month supply instead of $36 for a 1 month supply. [facility] stopped their discount drug pricing a year ago so pt will use [facility] from now on since he has no drug insurance.   Megan E. Supple, PharmD, [place] [id number] [address], [place], [place] [address] Phone: [phone]; Fax: [phone] 10/02/2016 10:43 AM

## 2016-10-02 ENCOUNTER — Ambulatory Visit (INDEPENDENT_AMBULATORY_CARE_PROVIDER_SITE_OTHER): Payer: Medicare Other | Admitting: Pharmacist

## 2016-10-02 ENCOUNTER — Inpatient Hospital Stay: Admit: 2016-10-02 | Payer: Medicare Other | Admitting: Surgery

## 2016-10-02 VITALS — BP 100/68 | HR 73 | Ht 70.0 in | Wt 234.0 lb

## 2016-10-02 DIAGNOSIS — E785 Hyperlipidemia, unspecified: Secondary | ICD-10-CM

## 2016-10-02 DIAGNOSIS — I2510 Atherosclerotic heart disease of native coronary artery without angina pectoris: Secondary | ICD-10-CM | POA: Diagnosis not present

## 2016-10-02 SURGERY — INSERTION, CAROTID SINUS BAROREFLEX ACTIVATION DEVICE
Anesthesia: General

## 2016-10-02 MED ORDER — PRAVASTATIN SODIUM 20 MG PO TABS: 20.0000 mg | ORAL_TABLET | Freq: Every evening | ORAL | 11 refills | Status: DC

## 2016-10-02 MED ORDER — METOPROLOL TARTRATE 25 MG PO TABS: 25.0000 mg | ORAL_TABLET | Freq: Two times a day (BID) | ORAL | 3 refills | Status: DC

## 2016-10-02 NOTE — Progress Notes (Signed)
[place], [place] Pagett's EF is about 15%.  I do not want him on metoprolol tartrate.  If metoprolol succinate is expensive, put him on Coreg 3.125 mg bid.    Also, can we not skip the pravastatin 20 daily and directly start [place]? He will not be able to take it and it will not get him anywhere near LDL goal anyway.  He's already been on pravastatin before.

## 2016-10-02 NOTE — Patient Instructions (Addendum)
Stop picking up your Crestor and metoprolol from [facility].  Your medicines will be cheaper at [facility].  Pick up metoprolol tartrate 25mg  twice a day from [facility] (this will replace your metoprolol succinate 25mg  twice a day) Once you run out of your Crestor, start taking pravastatin 20mg  once a day in the evening.  We will recheck your cholesterol in 2 months on Tuesday, December 19th any time after 7:30am (fasting blood work).

## 2016-10-03 ENCOUNTER — Telehealth: Payer: Self-pay | Admitting: Family Medicine

## 2016-10-03 NOTE — Telephone Encounter (Signed)
Pt calling to report sugar levels   181 10/03/16  225 10/02/16  209 10/01/16 214  10/01/16   287  09/30/16

## 2016-10-03 NOTE — Telephone Encounter (Signed)
We had asked that he check to see if he could get any assistance with Jardiance.  Any feedback?

## 2016-10-06 ENCOUNTER — Ambulatory Visit (HOSPITAL_COMMUNITY): Payer: Medicare Other

## 2016-10-06 ENCOUNTER — Ambulatory Visit (HOSPITAL_COMMUNITY)
Admission: RE | Admit: 2016-10-06 | Discharge: 2016-10-06 | Disposition: A | Discharge status: Home / Self Care | Payer: Medicare Other | Source: Ambulatory Visit | Attending: Cardiology | Admitting: Cardiology

## 2016-10-06 ENCOUNTER — Encounter (HOSPITAL_COMMUNITY): Payer: Self-pay | Admitting: *Deleted

## 2016-10-06 DIAGNOSIS — I5022 Chronic systolic (congestive) heart failure: Secondary | ICD-10-CM | POA: Insufficient documentation

## 2016-10-06 DIAGNOSIS — I5023 Acute on chronic systolic (congestive) heart failure: Secondary | ICD-10-CM | POA: Diagnosis not present

## 2016-10-06 DIAGNOSIS — E785 Hyperlipidemia, unspecified: Secondary | ICD-10-CM

## 2016-10-06 LAB — LIPID PANEL
Cholesterol: 146 mg/dL (ref 0–200)
HDL: 32 mg/dL — ABNORMAL LOW (ref >40)
LDL Cholesterol: 87 mg/dL (ref 0–99)
TRIGLYCERIDES: 135 mg/dL (ref <150)
Total CHOL/HDL Ratio: 4.6 RATIO
VLDL: 27 mg/dL (ref 0–40)

## 2016-10-06 LAB — HEPATIC FUNCTION PANEL
ALBUMIN: 3.6 g/dL (ref 3.5–5.0)
ALT: 32 U/L (ref 17–63)
AST: 33 U/L (ref 15–41)
Alkaline Phosphatase: 62 U/L (ref 38–126)
BILIRUBIN DIRECT: 0.2 mg/dL (ref 0.1–0.5)
Indirect Bilirubin: 0.8 mg/dL (ref 0.3–0.9)
Total Bilirubin: 1 mg/dL (ref 0.3–1.2)
Total Protein: 6.3 g/dL — ABNORMAL LOW (ref 6.5–8.1)

## 2016-10-08 ENCOUNTER — Telehealth: Payer: Self-pay

## 2016-10-08 NOTE — Telephone Encounter (Signed)
FYI: Pt is waiting to here back from company to see if he can get assistance.

## 2016-10-08 NOTE — Telephone Encounter (Signed)
Research call to patient and he states he has felt tight in his abdomen today with a 2-3lb wt gain. He states he is out working in his shop but feels fluid level is up. He states he understood that he could take an extra fluid pill if he weight is up 2-3lbs but he wanted Dr. Aundra Dubin to know that he plans to take one extra one today.  He inquired about his renal functions, which were improved/normal on 10/13 labs. Cr 1.20. Writer will call patient back in the AM to assess status. Pt knows to seek medical attention if his s/sx become worse. Very appreciative of care, no signs of distress.

## 2016-10-08 NOTE — Telephone Encounter (Signed)
Have him keep an eye on his weight, can take an extra fluid pill 2 days in a row.

## 2016-10-09 NOTE — Telephone Encounter (Signed)
Call to patient to inquire on status today. His wife answered and said he was doing "pretty good today, he went to grocery store." I will attempt to call him again shortly.

## 2016-10-09 NOTE — Telephone Encounter (Signed)
Ok will do. Thanks.

## 2016-10-15 ENCOUNTER — Telehealth: Payer: Self-pay

## 2016-10-15 DIAGNOSIS — Z006 Encounter for examination for normal comparison and control in clinical research program: Secondary | ICD-10-CM

## 2016-10-15 NOTE — Progress Notes (Signed)
Patient present for Week 2 BeAT-HF Study Visit; patient randomized to control arm of study. He states he is doing well and is really trying to stick to sodium restriction. Vital signs and assessment WNL's. He is not volume overloaded on exam.  He verbalizes med compliance, also he is working hard to get his blood sugars down. He has attempted to get Jardiance  Medication assistance but was declined. He states he is communicating am blood sugars to Dr. Elmon Else office and he will decide on anther medication if blood sugars don't come down on restricted diet. He feels better and he thinks it's due to medication compliance and doing better with his diet. He is awaiting call from Cardiac Rehab to start exercise program.  No other complaints or concerns this visit, will return in 2 weeks for Research Visit 4 Weeks.

## 2016-10-15 NOTE — Telephone Encounter (Signed)
Spoke with patient and he states he is doing well today. He does not feel "full" like he did yesterday. He verbalizes he will call HF clinic or seek medical attention if needed. Very pleasant.

## 2016-10-17 ENCOUNTER — Telehealth: Payer: Self-pay | Admitting: Family Medicine

## 2016-10-17 NOTE — Telephone Encounter (Signed)
Pt would like for you to have his sugar readings  10/23 214  10/24  279  10/25  227  10/26  183   10/27  193   10/28  191   10/29   165   10/30  191  11/1  194   11/2  202  11/3   206    188PM reading

## 2016-10-20 NOTE — Telephone Encounter (Signed)
These are obviously not to goal.  We are waiting to see if he can get assistance with getting Jardiance.

## 2016-10-20 NOTE — Telephone Encounter (Signed)
Tried calling pt with NA. 

## 2016-10-24 NOTE — Telephone Encounter (Signed)
Wayne [name] will be $450 a month. Please review.

## 2016-10-27 ENCOUNTER — Encounter (HOSPITAL_COMMUNITY): Payer: Medicare Other

## 2016-10-27 NOTE — Telephone Encounter (Signed)
Since he apparently does not have coverage for Jardiance, we need to look at other options.   Since cost is an issue, I would recommend we consider the following:  Novolin 70/30 insulin and start 20 units Wayne [name] q AC breakfast and 15 units Wayne [name] q AC supper.  Based on his weight, we will likely need to titrate up but will start slow.   Make sure he has follow up within 2 weeks. I would suggest that he come in for further instruction regarding giving insulin.

## 2016-10-30 NOTE — Telephone Encounter (Signed)
Pt is aware of annotations and declines using insulin. He would like a Pill form of medication rather using a needle. Any other suggestions for him?

## 2016-10-31 MED ORDER — GLIMEPIRIDE 2 MG PO TABS: 2.0000 mg | ORAL_TABLET | Freq: Every day | ORAL | 3 refills | Status: DC

## 2016-10-31 NOTE — Telephone Encounter (Signed)
We had discussed this at length with him  Because of his severe heart failure, Metformin has some increased risk.  DPP4 class and SGLT-2 class will be very expensive.  I really don't like to use sulfonylurea class secondary to potential for weight gain and hypoglycemia, but if he is refusing insulin, we don't have many options.  Start Amaryl 2 mg po once daily and office follow up in one month and bring home CBGs to review.

## 2016-10-31 NOTE — Telephone Encounter (Signed)
Pt is aware of annotations, medication sent to pharmacy and he has a pending appt.

## 2016-11-04 DIAGNOSIS — Z006 Encounter for examination for normal comparison and control in clinical research program: Secondary | ICD-10-CM

## 2016-11-05 NOTE — Progress Notes (Signed)
Patient in for BeAT-HF Study visit 4 Weeks/Medical Management Arm. He states he has been doing well since I last visit other than eating pickles. States "I had no idea that pickles had so much salt, and I will not eat them again as I woke up yesterday feeling some extra fluid; after my fluid medication I felt better." Patient denies any increased shortness of breath, leg edema, only his usual exertion with activity. He states "I can do a little bit and sit down and then do a little bit more." He looks forward to starting Cardiac Rehab here at the end of the month. He is seeing Dr. Jarold Song to assist with lowering his blood sugars and started an oral medication this week. He states "I only want to do insulin as a last resort, so I hope the pill works, and I am doing a lot better taking sugar out of my diet." Vital signs WNLs, not volume overloaded at present time. Verbalizes he will call HF clinic for signs and symptoms of CHF exacerbation. Very pleasant.

## 2016-11-10 ENCOUNTER — Ambulatory Visit (HOSPITAL_COMMUNITY)
Admission: RE | Admit: 2016-11-10 | Discharge: 2016-11-10 | Disposition: A | Discharge status: Home / Self Care | Payer: Medicare Other | Source: Ambulatory Visit | Attending: Cardiology | Admitting: Cardiology

## 2016-11-10 ENCOUNTER — Other Ambulatory Visit (HOSPITAL_COMMUNITY): Payer: Self-pay | Admitting: *Deleted

## 2016-11-10 ENCOUNTER — Encounter (HOSPITAL_COMMUNITY): Payer: Medicare Other

## 2016-11-10 ENCOUNTER — Encounter (HOSPITAL_COMMUNITY): Payer: Self-pay

## 2016-11-10 ENCOUNTER — Telehealth (HOSPITAL_COMMUNITY): Payer: Self-pay | Admitting: Cardiac Rehabilitation

## 2016-11-10 ENCOUNTER — Encounter (HOSPITAL_COMMUNITY): Payer: Self-pay | Admitting: *Deleted

## 2016-11-10 VITALS — BP 122/70 | HR 83 | Wt 236.0 lb

## 2016-11-10 DIAGNOSIS — R059 Cough, unspecified: Secondary | ICD-10-CM

## 2016-11-10 DIAGNOSIS — Z6833 Body mass index (BMI) 33.0-33.9, adult: Secondary | ICD-10-CM | POA: Insufficient documentation

## 2016-11-10 DIAGNOSIS — Z87891 Personal history of nicotine dependence: Secondary | ICD-10-CM | POA: Insufficient documentation

## 2016-11-10 DIAGNOSIS — Z7982 Long term (current) use of aspirin: Secondary | ICD-10-CM | POA: Insufficient documentation

## 2016-11-10 DIAGNOSIS — Z7984 Long term (current) use of oral hypoglycemic drugs: Secondary | ICD-10-CM | POA: Insufficient documentation

## 2016-11-10 DIAGNOSIS — I255 Ischemic cardiomyopathy: Secondary | ICD-10-CM | POA: Diagnosis not present

## 2016-11-10 DIAGNOSIS — I110 Hypertensive heart disease with heart failure: Secondary | ICD-10-CM | POA: Insufficient documentation

## 2016-11-10 DIAGNOSIS — Z888 Allergy status to other drugs, medicaments and biological substances status: Secondary | ICD-10-CM | POA: Insufficient documentation

## 2016-11-10 DIAGNOSIS — M353 Polymyalgia rheumatica: Secondary | ICD-10-CM | POA: Insufficient documentation

## 2016-11-10 DIAGNOSIS — I700 Atherosclerosis of aorta: Secondary | ICD-10-CM | POA: Insufficient documentation

## 2016-11-10 DIAGNOSIS — M1990 Unspecified osteoarthritis, unspecified site: Secondary | ICD-10-CM | POA: Diagnosis not present

## 2016-11-10 DIAGNOSIS — Z8546 Personal history of malignant neoplasm of prostate: Secondary | ICD-10-CM | POA: Diagnosis not present

## 2016-11-10 DIAGNOSIS — E1151 Type 2 diabetes mellitus with diabetic peripheral angiopathy without gangrene: Secondary | ICD-10-CM | POA: Diagnosis not present

## 2016-11-10 DIAGNOSIS — G4733 Obstructive sleep apnea (adult) (pediatric): Secondary | ICD-10-CM | POA: Diagnosis not present

## 2016-11-10 DIAGNOSIS — I5022 Chronic systolic (congestive) heart failure: Secondary | ICD-10-CM | POA: Diagnosis not present

## 2016-11-10 DIAGNOSIS — Z79899 Other long term (current) drug therapy: Secondary | ICD-10-CM | POA: Insufficient documentation

## 2016-11-10 DIAGNOSIS — R05 Cough: Secondary | ICD-10-CM

## 2016-11-10 DIAGNOSIS — Z955 Presence of coronary angioplasty implant and graft: Secondary | ICD-10-CM | POA: Diagnosis not present

## 2016-11-10 DIAGNOSIS — E785 Hyperlipidemia, unspecified: Secondary | ICD-10-CM | POA: Diagnosis not present

## 2016-11-10 DIAGNOSIS — Z8249 Family history of ischemic heart disease and other diseases of the circulatory system: Secondary | ICD-10-CM | POA: Diagnosis not present

## 2016-11-10 DIAGNOSIS — R509 Fever, unspecified: Secondary | ICD-10-CM | POA: Diagnosis not present

## 2016-11-10 DIAGNOSIS — E669 Obesity, unspecified: Secondary | ICD-10-CM | POA: Diagnosis not present

## 2016-11-10 DIAGNOSIS — R490 Dysphonia: Secondary | ICD-10-CM | POA: Insufficient documentation

## 2016-11-10 DIAGNOSIS — R918 Other nonspecific abnormal finding of lung field: Secondary | ICD-10-CM | POA: Diagnosis not present

## 2016-11-10 DIAGNOSIS — I2510 Atherosclerotic heart disease of native coronary artery without angina pectoris: Secondary | ICD-10-CM | POA: Diagnosis not present

## 2016-11-10 LAB — BASIC METABOLIC PANEL
ANION GAP: 9 (ref 5–15)
BUN: 20 mg/dL (ref 6–20)
CHLORIDE: 103 mmol/L (ref 101–111)
CO2: 24 mmol/L (ref 22–32)
Calcium: 9.2 mg/dL (ref 8.9–10.3)
Creatinine, Ser: 1.34 mg/dL — ABNORMAL HIGH (ref 0.61–1.24)
GFR calc non Af Amer: 51 mL/min — ABNORMAL LOW (ref >60)
GFR, EST AFRICAN AMERICAN: 59 mL/min — AB (ref >60)
Glucose, Bld: 187 mg/dL — ABNORMAL HIGH (ref 65–99)
Potassium: 4.3 mmol/L (ref 3.5–5.1)
Sodium: 136 mmol/L (ref 135–145)

## 2016-11-10 LAB — BRAIN NATRIURETIC PEPTIDE: B Natriuretic Peptide: 2394.6 pg/mL — ABNORMAL HIGH (ref 0.0–100.0)

## 2016-11-10 MED ORDER — CARVEDILOL 3.125 MG PO TABS: 3.1250 mg | ORAL_TABLET | Freq: Two times a day (BID) | ORAL | 3 refills | Status: DC

## 2016-11-10 MED ORDER — DOXYCYCLINE HYCLATE 100 MG PO CAPS: 100.0000 mg | ORAL_CAPSULE | Freq: Two times a day (BID) | ORAL | 0 refills | Status: DC

## 2016-11-10 MED ORDER — FUROSEMIDE 80 MG PO TABS: 80.0000 mg | ORAL_TABLET | Freq: Two times a day (BID) | ORAL | 3 refills | Status: DC

## 2016-11-10 NOTE — Telephone Encounter (Signed)
pc to pt to discuss cardiac rehab orientation. Pt requests to cancel at this time.  Pt will call back to reschedule.

## 2016-11-10 NOTE — Patient Instructions (Signed)
Labs today (will call for abnormal results, otherwise no news is good news)  Stop taking Metoprolol  Start taking Coreg 3.125 mg (1 Tab) Two times Daily  Start Doxycycline 100 mg (1 Tab) Two times Daily for 7 days  Increase lasix to 80 mg (1 Tab) Two times Daily  Chest Xray today  Left/Right heart Cath has been ordered, see attached instructions  Follow up in 4 weeks

## 2016-11-10 NOTE — Progress Notes (Signed)
Initial Encounter with LVAD Team and MCS Introduction:  Wayne [name] is a 75 y.o. male whom  has a past medical history of Adenocarcinoma of prostate ([place]); Arthritis; Cellulitis of left leg; Chronic combined systolic and diastolic CHF (congestive heart failure) ([place]); Colon polyps; CORONARY ATHEROSCLEROSIS NATIVE CORONARY ARTERY; Diabetes mellitus, type 2 ([place]); GERD (gastroesophageal reflux disease); Hematoma of leg; Herpes zoster ophthalmicus; HYPERLIPIDEMIA; HYPERTENSION; Ischemic cardiomyopathy; Noncompliance; Obesity; OSA (obstructive sleep apnea); and Polymyalgia rheumatica ([place]). We have been asked to evaluate the patient for advanced therapies which include Left Ventricular Assist Device implantation per Dr. Aundra Dubin.   Lab Results  Component Value Date   ABORH O POS 07/06/2012    Lab Results  Component Value Date   HGBA1C 8.7 (H) 07/14/2016   Lab Results  Component Value Date   CREATININE 1.20 09/26/2016   CREATININE 1.36 (H) 09/08/2016   CREATININE 1.39 (H) 09/05/2016    VAD educational packet including "HM II Patient Handbook", "HM II Left Ventricular Assist System" packet, and "[place] HM II Patient Education" reviewed in detail with me and left at bedside for continued reference.   Explained that LVAD can be implanted for two indications in the setting of advanced left ventricular heart failure treatment:  Bridge to transplant - used for patients who cannot safely wait for heart transplant without this device.  Or   Destination therapy - used for patients until end of life or recovery of heart function.  Discussed that at this point Wayne [name] would be considered for Destination therapy should he be deemed an acceptable VAD candidate. Age is excluding for transplant likely.   Provided brief equipment overview of the HeartMate II pump and discussed placement, surgical procedure, peripheral equipment, life-long coumadin therapy, importance of medication adherence and  clinic follow up for as long as patient is living on support, life-style modifications, as well as need for caregiver to be successful with this therapy.   The patient asked good questions during encounter. Very hesitant and is hopeful that he will not need this device. Explained several times the differences between mechanical heart pump vs ICD. I see he is in the BeAT-HF study medical management arm. We discussed the process of the evaluation period and how a decision was made by the [facility] team whether he would be an appropriate candidate for therapy or not. Evaluation consent was reviewed and given for reference at this time.   Advised the patient review the materials, contact VAD Coordinators or Dr. Aundra Dubin with questions. He is going to have [place] done soon. I informed him that I would follow along with his visits but [facility] will help [place] determine more information about need. Verbalized he would review the evaluation consent and make a decision regarding the evaluation process.   At this point after briefly reviewing her chart some points that would exclude the patient or make them extremely high risk to have LVAD surgery include:   Unsure of caregiver situation as he was very hesitant to continue conversation at this point.    Session Time: 20 min  Janene Madeira, RN VAD Coordinator   Office: [phone] 24/7 VAD Pager: [phone]

## 2016-11-10 NOTE — Progress Notes (Signed)
Patient ID: Wayne [name], male   DOB: 06/12/1941, 75 y.o.   MRN: [id number]   Advanced Heart Failure Clinic Note   Patient ID: Wayne [name], male   DOB: 03/04/1941, 75 y.o.   MRN: [id number] PCP: Dr. Elease Hashimoto Cardiology: Dr. Aundra Dubin  74 yo with history of DM, HTN, CAD, and ischemic cardiomyopathy presents for cardiology followup. Patient had a CHF exacerbation in 2/12 and was found to have LV systolic dysfunction with EF around 20%. LHC showed RCA, ramus, and CFX disease. RCA was subtotally occluded. Cardiac MRI showed that all walls, including the inferior wall, should be viable. Patient therefore underwent opening of his chronic totally occluded RCA as well as PCI to the ramus in 2/12. He received drug eluting stents and was on Effient.  He had an echo in 2/13 that showed EF improved to 45% with moderate LV dilation and mild LV hypertrophy.   In 5/13, he developed a large left lower leg hematoma.  He was still on Effient at that time.  ASA and Effient were stopped.  The hematoma did not resolve.  In 7/13, he was re-admitted with septic shock from MSSA from an abscess in his left gastrocnemius.  He also grew Pseudomonas from the left gastrocnemius as well.  He had a prolonged course in the hospital and later in a rehab facility.  Ultimately, he got back home again.  I had him get an echo in 1/14, and this showed EF 15% with diffuse hypokinesis and inferoposterior akinesis.  He had been off of most of his prior cardiac medications.  I took him back for [facility] in 1/14.  This showed subtotal occlusion of a small AV LCx and 95% ostial in-stent restenosis in the RCA.  He was treated in 2/14 with a Xience DES to the ostial RCA and begun on Plavix.  Unfortunately, he developed diffuse hives after starting Plavix and had to be switched to ticlopidine.  He has tolerated ticlopidine.   Echo done in 12/14 showed some improvement in LV function but EF was still low at 30-35%.  He did not want ICD.    Presented to ED  11/14/15 with worsening SOB after a few steps and CXR with CHF and bilateral effusions in the setting of marked medical non-compliance. States he stopped taking his medicines in 01/2015 (except for ASA 81 and a multivitamin).  He was feeling good and decided that he did not need them anymore.  Also c/o URI symptoms. Diuresed over 2 L with IV diuretics in the ER and started on lasix 20 mg daily.   Echo (12/16) showed EF 20-25% with diffuse hypokinesis (down from 35% in 12/15).  Echo 10/17 showed EF 15% with mildly decreased RV systolic function.   He has had some difficulty with medication compliance.  However, he has is now in the BEAT-HF trial medical arm and visits with the research nurse have really helped his medication compliance. He is currently taking meds as ordered.   He was seen at pharmacy clinic at [facility] recently.  He was taken off Toprol XL and started on metoprolol tartrate.  He was put on low dose pravastatin rather than Repatha.  CPX in 10/17 showed severe functional limitation due to heart failure.   No chest pain.  Weight is up 3 lbs. Recently, he has been hoarse and has had a cough with pink sputum.  ?Subjective fevers.  No dyspnea walking slowly on flat ground.  Short of breath if he hurries or  walks up a hill.  Feels like he is slowly getting worse.  Tight abdomen.  +Orthopnea, no PND.    Labs (12/16): K 4.9, creatinine 1.23 Labs (1/17): K 4.7, creatinine 1.15, BNP 1433 Labs (2/17): K 4.7, creatinine 1.14, BNP 870 Labs (3/17): K 4.5, creatinine 1.10, BNP 1257 Labs (5/17): K 4.2, creatinine 1.2, BNP 818 Labs (7/17): LDL 149, HDL 40, TGs 210 Labs (9/17): K 4.9, creatinine 1.36 Labs (10/17): K 3.7, creatinine 1.2, LDL 87, HDL 32, LFTs normal  Past Medical History:  1. HYPERTENSION  2. HYPERLIPIDEMIA: Myalgias with atorvastatin and Crestor 3. ECZEMA  4. RHINITIS  5. HERPES ZOSTER OPHTHALMICUS  6. ADENOCARCINOMA, PROSTATE: Status post prostatectomy in 2009. Has had some  incontinence since then.  7. Diabetes mellitus type II  8. Arthritis  9. Obesity  10. GERD: rare  11. CAD: Presented with exertional dyspnea, never had chest pain. LHC (2/12) with subtotalled proximal RCA and left to right collaterals, 90% proximal moderate-sized ramus, 95% proximal relatively small CFX, 40-50% proximal LAD. Cardiac MRI (2/12) showed EF 21%, some mild scar in basal segments but all wall segments would be expected to be viable. DES x 5 (overlapping) to RCA, DES x 1 to RI 01/30/11 .  Bled into leg with Effient use (long, complicated course).  LHC (1/14): AV LCx small with subtotal occlusion, 95% ostial instent restenosis in RCA. PCI in 2/14 to ostial RCA with 3.5 x 15 Xience DES.  Plavix allergy (hives) so put on ticlopidine.  12. Ischemic CMP: Echo (2/12) with moderately dilated LV, EF about 20% with diffuse hypokinesis and inferior akinesis, pseudonormal diastolic function, mild MR, severe LAE, mildly decreased RV systolic function. RHC (2/12) with mean RA 12, PA 40/25, mean PCWP 26, CI 2.1.  Echo (5/12) with EF 40% (appeared worse to my eye) with posterior HK, basal inferior AK, inferoseptal AK, basal anteroseptal AK, mild MR.  Cardiac MRI was repeated and showed EF 32% (improved from 21%) and mild LV dilation (was severely dilated before) with diffuse hypokinesis and subendocardial scar in the basal inferior, basal posterior, and basal anterolateral segments.  Echo (2/13) with EF 45%, moderate LV dilation, mild LVH.  Echo (1/14) with EF 15%, diffuse hypokinesis, inferoposterior akinesis, mild MR, grade I diastolic dysfunction.  Echo (5/14) with EF 30-35%, mild LV dilation, akinesis of the basal inferior wall otherwise diffuse hypokinesis.  Echo (12/14) with EF 30-35%, mild LV dilation, diffuse hypokinesis with basal inferior and posterior akinesis. Echo (12/15) with EF 35%, mildly dilated LV, wall motion abnormalities, normal RV size and systolic function. Echo (12/16) with EF 20-25%, diffuse  hypokinesis, severe LV dilation, mild MR.  - Echo (10/17): EF 15%, grade II diastolic dysfunction, mild MR, mildly decreased RV systolic function.  - Possible angioedema related to Entresto.  - Hyperkalemia with spironolactone 12.5 daily.  - CPX (10/17): peak VO2 10.6, VE/VCO2 slope 48.6, RER 1.12.  Severe HF limitation.  13. Cervical OA.  14. Polymyalgia rheumatica 15. OSA: Severe on sleep study 10/12.  On CPAP.  16. Left lower leg hematoma in setting of Effient use.  He developed a left gastrocnemius abscess and septic shock with prolonged hospitalization beginning in 7/13.  17. PAD: Lower extremity arterial doppler evaluation in 2/17 showed occluded peroneal arteries bilaterally, ABI 1.1 (R) and 1.0 (L).   18. Carotid dopplers (10/17) with minimal disease.   Family History:  Father died with MI at age 57. He was an alcoholic. Mother died with cancer at around 83.  Social History:  Occupation: retired Administrator  Married, lives in [place]  Past smoker, quit around [place]: all systems reviewed and negative except as per HPI.    Current Outpatient Prescriptions  Medication Sig Dispense Refill  . aspirin EC 81 MG tablet Take 81 mg by mouth daily.    . furosemide (LASIX) 80 MG tablet Take 1 tablet (80 mg total) by mouth 2 (two) times daily. 60 tablet 3  . glimepiride (AMARYL) 2 MG tablet Take 1 tablet (2 mg total) by mouth daily before breakfast. 30 tablet 3  . glucose blood (ACCU-CHEK AVIVA) test strip Check blood sugars 1-2 times per day. DX: E11.65 200 each 5  . Lancets (ACCU-CHEK SOFT TOUCH) lancets Check blood sugars 1-2 times per day. DX: E11.65 200 each 5  . losartan (COZAAR) 25 MG tablet Take 0.5 tablets (12.5 mg total) by mouth 2 (two) times daily. 30 tablet 3  . Multiple Vitamins-Minerals (CENTRUM SILVER ADULT 50+) TABS Take 1 tablet by mouth daily.    . pravastatin (PRAVACHOL) 20 MG tablet Take 1 tablet (20 mg total) by mouth every evening. 30 tablet 11  .  carvedilol (COREG) 3.125 MG tablet Take 1 tablet (3.125 mg total) by mouth 2 (two) times daily. 60 tablet 3  . doxycycline (VIBRAMYCIN) 100 MG capsule Take 1 capsule (100 mg total) by mouth 2 (two) times daily. 14 capsule 0   No current facility-administered medications for this encounter.     BP 122/70   Pulse 83   Wt 236 lb (107 kg)   SpO2 98%   BMI 33.86 kg/m    Wt Readings from Last 3 Encounters:  11/10/16 236 lb (107 kg)  11/05/16 234 lb (106.1 kg)  10/15/16 236 lb (107 kg)    General: NAD, obese.  Neck: Thick, JVP 9-10 cm, no thyromegaly or thyroid nodule. No carotid bruit.  HEENT: Normal.  Lungs: Bilateral rhonchi CV: Nondisplaced PMI. Heart regular S1/S2, no S3/S4, no murmur. Difficult to palpate pedal pulses.  Abdomen: Soft, NT, mild/moderate distention, no HSM. No bruits or masses. +BS  Neurologic: Alert and oriented x 3.  Psych: Pleasant affect.  Extremities: No clubbing or cyanosis.1+ ankle edema.  Assessment/Plan:  1. Chronic systolic CHF: Ischemic cardiomyopathy.  10/17 echo with EF 15%, diffuse hypokinesis.  NYHA class III symptoms, slowly worsening.  CPX in 10/17 with severe HF limitation.  He is volume overloaded on exam and weight is up.  Doing better with medication compliance.  - Increase Lasix to 80 mg bid with BMET/BNP today and again in 1 week.  - Off spironolactone with elevated K.  - Continue losartan 12.5 mg bid. Of note, patient had possible angioedema related to [facility]. - Stop metoprolol tartrate, start Coreg 3.125 mg bid.    - I am concerned about his trajectory, CPX suggests that he may be nearing need for advanced therapies.  I am going to arrange for RHC/LHC to assess filling pressures, cardiac output, and to look at coronaries for any potential interventional option. - We discussed ICD again today.  He wants to do cath first.  He would not be a CRT candidate with narrow QRS.  - He may be an LVAD candidate in the future.  As above, compliance  has improved.  He will meet our LVAD nurse today.  - He has been referred to cardiac rehab.  2. CAD: No CP.  Last PCI 01/2013. Completed his year-long course of ticlopidine.  He was allergic to Plavix  and had severe gastrocnemius bleed with Effient. - Continue ASA 81 and pravastatin 20 mg daily. - As above, plan RHC/LHC later this week.  3. HLD: Has had myalgias with Crestor and atorvastatin.  He is now on pravastatin 20 mg daily.  LDL is improved, not far from goal. 4. PAD: Occluded peroneal arteries bilaterally.  No significant claudication.  5. Diabetes type II: Unable to get empagliflozin.  6. ID: Hoarse, subjective fevers, rhonchi on lung exam.  Possible acute bronchitis versus PNA (versus just volume overload).  I will get CXR PA/lateral and give him a course of doxycycline 100 mg bid.   Followup in 1 month.    Loralie Champagne  11/10/2016

## 2016-11-11 ENCOUNTER — Inpatient Hospital Stay (HOSPITAL_COMMUNITY): Admission: RE | Admit: 2016-11-11 | Payer: Medicare Other | Source: Ambulatory Visit

## 2016-11-14 ENCOUNTER — Encounter (HOSPITAL_COMMUNITY): Payer: Self-pay | Admitting: *Deleted

## 2016-11-14 ENCOUNTER — Inpatient Hospital Stay (HOSPITAL_COMMUNITY): Payer: Medicare Other

## 2016-11-14 ENCOUNTER — Inpatient Hospital Stay (HOSPITAL_COMMUNITY)
Admission: RE | Admit: 2016-11-14 | Discharge: 2016-11-20 | DRG: 215 | Disposition: A | Discharge status: Home / Self Care | Payer: Medicare Other | Source: Ambulatory Visit | Attending: Cardiology | Admitting: Cardiology

## 2016-11-14 ENCOUNTER — Encounter (HOSPITAL_COMMUNITY)
Admission: RE | Disposition: A | Discharge status: Home / Self Care | Payer: Self-pay | Source: Ambulatory Visit | Attending: Cardiology

## 2016-11-14 DIAGNOSIS — E669 Obesity, unspecified: Secondary | ICD-10-CM | POA: Diagnosis not present

## 2016-11-14 DIAGNOSIS — Z79899 Other long term (current) drug therapy: Secondary | ICD-10-CM | POA: Diagnosis not present

## 2016-11-14 DIAGNOSIS — M47812 Spondylosis without myelopathy or radiculopathy, cervical region: Secondary | ICD-10-CM | POA: Diagnosis present

## 2016-11-14 DIAGNOSIS — Z888 Allergy status to other drugs, medicaments and biological substances status: Secondary | ICD-10-CM | POA: Diagnosis not present

## 2016-11-14 DIAGNOSIS — K219 Gastro-esophageal reflux disease without esophagitis: Secondary | ICD-10-CM | POA: Diagnosis present

## 2016-11-14 DIAGNOSIS — Z7984 Long term (current) use of oral hypoglycemic drugs: Secondary | ICD-10-CM

## 2016-11-14 DIAGNOSIS — I340 Nonrheumatic mitral (valve) insufficiency: Secondary | ICD-10-CM | POA: Diagnosis present

## 2016-11-14 DIAGNOSIS — E785 Hyperlipidemia, unspecified: Secondary | ICD-10-CM | POA: Diagnosis present

## 2016-11-14 DIAGNOSIS — Z792 Long term (current) use of antibiotics: Secondary | ICD-10-CM | POA: Diagnosis not present

## 2016-11-14 DIAGNOSIS — M353 Polymyalgia rheumatica: Secondary | ICD-10-CM | POA: Diagnosis present

## 2016-11-14 DIAGNOSIS — I5023 Acute on chronic systolic (congestive) heart failure: Secondary | ICD-10-CM | POA: Diagnosis not present

## 2016-11-14 DIAGNOSIS — E1151 Type 2 diabetes mellitus with diabetic peripheral angiopathy without gangrene: Secondary | ICD-10-CM | POA: Diagnosis present

## 2016-11-14 DIAGNOSIS — I509 Heart failure, unspecified: Secondary | ICD-10-CM | POA: Diagnosis not present

## 2016-11-14 DIAGNOSIS — I472 Ventricular tachycardia: Secondary | ICD-10-CM | POA: Diagnosis not present

## 2016-11-14 DIAGNOSIS — Z6833 Body mass index (BMI) 33.0-33.9, adult: Secondary | ICD-10-CM

## 2016-11-14 DIAGNOSIS — T82897A Other specified complication of cardiac prosthetic devices, implants and grafts, initial encounter: Secondary | ICD-10-CM | POA: Diagnosis present

## 2016-11-14 DIAGNOSIS — J209 Acute bronchitis, unspecified: Secondary | ICD-10-CM | POA: Diagnosis present

## 2016-11-14 DIAGNOSIS — I110 Hypertensive heart disease with heart failure: Secondary | ICD-10-CM | POA: Diagnosis not present

## 2016-11-14 DIAGNOSIS — Z7902 Long term (current) use of antithrombotics/antiplatelets: Secondary | ICD-10-CM

## 2016-11-14 DIAGNOSIS — I2510 Atherosclerotic heart disease of native coronary artery without angina pectoris: Secondary | ICD-10-CM | POA: Diagnosis not present

## 2016-11-14 DIAGNOSIS — R490 Dysphonia: Secondary | ICD-10-CM | POA: Diagnosis not present

## 2016-11-14 DIAGNOSIS — I255 Ischemic cardiomyopathy: Secondary | ICD-10-CM | POA: Diagnosis present

## 2016-11-14 DIAGNOSIS — Z7982 Long term (current) use of aspirin: Secondary | ICD-10-CM | POA: Diagnosis not present

## 2016-11-14 DIAGNOSIS — E875 Hyperkalemia: Secondary | ICD-10-CM | POA: Diagnosis present

## 2016-11-14 DIAGNOSIS — Z87891 Personal history of nicotine dependence: Secondary | ICD-10-CM | POA: Diagnosis not present

## 2016-11-14 DIAGNOSIS — Z955 Presence of coronary angioplasty implant and graft: Secondary | ICD-10-CM

## 2016-11-14 DIAGNOSIS — I5022 Chronic systolic (congestive) heart failure: Secondary | ICD-10-CM

## 2016-11-14 HISTORY — PX: ULTRASOUND GUIDANCE FOR VASCULAR ACCESS: SHX6516

## 2016-11-14 HISTORY — PX: CARDIAC CATHETERIZATION: SHX172

## 2016-11-14 LAB — GLUCOSE, CAPILLARY
Glucose-Capillary: 199 mg/dL — ABNORMAL HIGH (ref 65–99)
Glucose-Capillary: 128 mg/dL — ABNORMAL HIGH (ref 65–99)

## 2016-11-14 LAB — BASIC METABOLIC PANEL
Anion gap: 10 (ref 5–15)
BUN: 25 mg/dL — AB (ref 6–20)
CHLORIDE: 106 mmol/L (ref 101–111)
CO2: 23 mmol/L (ref 22–32)
Calcium: 9.9 mg/dL (ref 8.9–10.3)
Creatinine, Ser: 1.26 mg/dL — ABNORMAL HIGH (ref 0.61–1.24)
GFR calc Af Amer: >60 mL/min (ref >60)
GFR calc non Af Amer: 55 mL/min — ABNORMAL LOW (ref >60)
GLUCOSE: 216 mg/dL — AB (ref 65–99)
POTASSIUM: 3.9 mmol/L (ref 3.5–5.1)
Sodium: 139 mmol/L (ref 135–145)

## 2016-11-14 LAB — POCT I-STAT 3, VENOUS BLOOD GAS (G3P V)
Acid-base deficit: 1 mmol/L (ref 0.0–2.0)
Acid-base deficit: 1 mmol/L (ref 0.0–2.0)
Bicarbonate: 24.6 mmol/L (ref 20.0–28.0)
Bicarbonate: 24.6 mmol/L (ref 20.0–28.0)
O2 SAT: 57 %
O2 Saturation: 57 %
PCO2 VEN: 42.9 mmHg — AB (ref 44.0–60.0)
PCO2 VEN: 43.5 mmHg — AB (ref 44.0–60.0)
PO2 VEN: 31 mmHg — AB (ref 32.0–45.0)
TCO2: 26 mmol/L (ref 0–100)
TCO2: 26 mmol/L (ref 0–100)
pH, Ven: 7.36 (ref 7.250–7.430)
pH, Ven: 7.367 (ref 7.250–7.430)
pO2, Ven: 31 mmHg — CL (ref 32.0–45.0)

## 2016-11-14 LAB — CBC
HEMATOCRIT: 40 % (ref 39.0–52.0)
HEMOGLOBIN: 13 g/dL (ref 13.0–17.0)
MCH: 28.1 pg (ref 26.0–34.0)
MCHC: 32.5 g/dL (ref 30.0–36.0)
MCV: 86.6 fL (ref 78.0–100.0)
Platelets: 195 10*3/uL (ref 150–400)
RBC: 4.62 MIL/uL (ref 4.22–5.81)
RDW: 14.4 % (ref 11.5–15.5)
WBC: 7.7 10*3/uL (ref 4.0–10.5)

## 2016-11-14 LAB — PROTIME-INR
INR: 1.08
Prothrombin Time: 14 seconds (ref 11.4–15.2)

## 2016-11-14 LAB — TSH: TSH: 0.932 u[IU]/mL (ref 0.350–4.500)

## 2016-11-14 LAB — BRAIN NATRIURETIC PEPTIDE: B Natriuretic Peptide: 1992.9 pg/mL — ABNORMAL HIGH (ref 0.0–100.0)

## 2016-11-14 SURGERY — RIGHT/LEFT HEART CATH AND CORONARY ANGIOGRAPHY

## 2016-11-14 MED ORDER — SODIUM CHLORIDE 0.9 % IV SOLN: 250.0000 mL | INTRAVENOUS | Status: DC | PRN

## 2016-11-14 MED ORDER — FENTANYL CITRATE (PF) 100 MCG/2ML IJ SOLN
INTRAMUSCULAR | Status: AC
  Filled 2016-11-14: qty 2

## 2016-11-14 MED ORDER — MIDAZOLAM HCL 2 MG/2ML IJ SOLN
INTRAMUSCULAR | Status: AC
  Filled 2016-11-14: qty 2

## 2016-11-14 MED ORDER — IOPAMIDOL (ISOVUE-370) INJECTION 76%
INTRAVENOUS | Status: DC | PRN
  Administered 2016-11-14: 115 mL via INTRA_ARTERIAL

## 2016-11-14 MED ORDER — MIDAZOLAM HCL 2 MG/2ML IJ SOLN
INTRAMUSCULAR | Status: DC | PRN
  Administered 2016-11-14 (×2): 1 mg via INTRAVENOUS

## 2016-11-14 MED ORDER — VERAPAMIL HCL 2.5 MG/ML IV SOLN
INTRAVENOUS | Status: DC | PRN
  Administered 2016-11-14: 10 mL via INTRA_ARTERIAL

## 2016-11-14 MED ORDER — ONDANSETRON HCL 4 MG/2ML IJ SOLN: 4.0000 mg | Freq: Four times a day (QID) | INTRAMUSCULAR | Status: DC | PRN

## 2016-11-14 MED ORDER — ACETAMINOPHEN 325 MG PO TABS: 650.0000 mg | ORAL_TABLET | ORAL | Status: DC | PRN

## 2016-11-14 MED ORDER — GADOBENATE DIMEGLUMINE 529 MG/ML IV SOLN
40.0000 mL | Freq: Once | INTRAVENOUS | Status: AC
  Administered 2016-11-14: 40 mL via INTRAVENOUS

## 2016-11-14 MED ORDER — CARVEDILOL 3.125 MG PO TABS
3.1250 mg | ORAL_TABLET | Freq: Two times a day (BID) | ORAL | Status: DC
  Administered 2016-11-15 – 2016-11-19 (×6): 3.125 mg via ORAL
  Filled 2016-11-14 (×7): qty 1

## 2016-11-14 MED ORDER — POTASSIUM CHLORIDE CRYS ER 20 MEQ PO TBCR
40.0000 meq | EXTENDED_RELEASE_TABLET | Freq: Every day | ORAL | Status: DC
  Administered 2016-11-15 – 2016-11-20 (×5): 40 meq via ORAL
  Filled 2016-11-14 (×5): qty 2

## 2016-11-14 MED ORDER — FUROSEMIDE 10 MG/ML IJ SOLN
60.0000 mg | Freq: Two times a day (BID) | INTRAMUSCULAR | Status: DC
  Administered 2016-11-14: 60 mg via INTRAVENOUS
  Filled 2016-11-14 (×2): qty 6

## 2016-11-14 MED ORDER — SODIUM CHLORIDE 0.9% FLUSH: 3.0000 mL | INTRAVENOUS | Status: DC | PRN

## 2016-11-14 MED ORDER — HEPARIN (PORCINE) IN NACL 2-0.9 UNIT/ML-% IJ SOLN
INTRAMUSCULAR | Status: AC
  Filled 2016-11-14: qty 1000

## 2016-11-14 MED ORDER — IOPAMIDOL (ISOVUE-370) INJECTION 76%
INTRAVENOUS | Status: AC
  Filled 2016-11-14: qty 100

## 2016-11-14 MED ORDER — DIGOXIN 125 MCG PO TABS
0.1250 mg | ORAL_TABLET | Freq: Every day | ORAL | Status: DC
  Administered 2016-11-15 – 2016-11-20 (×6): 0.125 mg via ORAL
  Filled 2016-11-14 (×6): qty 1

## 2016-11-14 MED ORDER — HEPARIN SODIUM (PORCINE) 5000 UNIT/ML IJ SOLN: 5000.0000 [IU] | Freq: Three times a day (TID) | INTRAMUSCULAR | Status: DC

## 2016-11-14 MED ORDER — ASPIRIN 81 MG PO CHEW
81.0000 mg | CHEWABLE_TABLET | Freq: Every day | ORAL | Status: DC
  Administered 2016-11-15 – 2016-11-16 (×2): 81 mg via ORAL
  Filled 2016-11-14 (×2): qty 1

## 2016-11-14 MED ORDER — VERAPAMIL HCL 2.5 MG/ML IV SOLN
INTRAVENOUS | Status: AC
  Filled 2016-11-14: qty 2

## 2016-11-14 MED ORDER — SODIUM CHLORIDE 0.9% FLUSH: 3.0000 mL | Freq: Two times a day (BID) | INTRAVENOUS | Status: DC

## 2016-11-14 MED ORDER — FENTANYL CITRATE (PF) 100 MCG/2ML IJ SOLN
INTRAMUSCULAR | Status: DC | PRN
  Administered 2016-11-14 (×2): 25 ug via INTRAVENOUS

## 2016-11-14 MED ORDER — LIDOCAINE HCL (PF) 1 % IJ SOLN
INTRAMUSCULAR | Status: DC | PRN
  Administered 2016-11-14: 5 mL

## 2016-11-14 MED ORDER — HEPARIN (PORCINE) IN NACL 2-0.9 UNIT/ML-% IJ SOLN
INTRAMUSCULAR | Status: DC | PRN
  Administered 2016-11-14: 1500 mL

## 2016-11-14 MED ORDER — HEPARIN SODIUM (PORCINE) 1000 UNIT/ML IJ SOLN
INTRAMUSCULAR | Status: AC
  Filled 2016-11-14: qty 1

## 2016-11-14 MED ORDER — INSULIN ASPART 100 UNIT/ML ~~LOC~~ SOLN
0.0000 [IU] | Freq: Three times a day (TID) | SUBCUTANEOUS | Status: DC
Start: 2016-11-14 — End: 2016-11-20
  Administered 2016-11-15: 2 [IU] via SUBCUTANEOUS
  Administered 2016-11-15: 3 [IU] via SUBCUTANEOUS
  Administered 2016-11-16 – 2016-11-18 (×4): 2 [IU] via SUBCUTANEOUS
  Administered 2016-11-18 – 2016-11-19 (×3): 3 [IU] via SUBCUTANEOUS
  Administered 2016-11-20: 5 [IU] via SUBCUTANEOUS
  Administered 2016-11-20: 3 [IU] via SUBCUTANEOUS

## 2016-11-14 MED ORDER — ASPIRIN 81 MG PO CHEW: 81.0000 mg | CHEWABLE_TABLET | ORAL | Status: DC

## 2016-11-14 MED ORDER — PRAVASTATIN SODIUM 20 MG PO TABS
20.0000 mg | ORAL_TABLET | Freq: Every day | ORAL | Status: DC
  Administered 2016-11-14 – 2016-11-19 (×5): 20 mg via ORAL
  Filled 2016-11-14 (×5): qty 1

## 2016-11-14 MED ORDER — DOXYCYCLINE HYCLATE 100 MG PO TABS
100.0000 mg | ORAL_TABLET | Freq: Two times a day (BID) | ORAL | Status: DC
  Administered 2016-11-14 – 2016-11-18 (×7): 100 mg via ORAL
  Filled 2016-11-14 (×7): qty 1

## 2016-11-14 MED ORDER — SODIUM CHLORIDE 0.9 % IV SOLN: INTRAVENOUS | Status: DC

## 2016-11-14 MED ORDER — LOSARTAN POTASSIUM 25 MG PO TABS
12.5000 mg | ORAL_TABLET | Freq: Two times a day (BID) | ORAL | Status: DC
  Administered 2016-11-14 – 2016-11-18 (×8): 12.5 mg via ORAL
  Filled 2016-11-14 (×8): qty 1

## 2016-11-14 MED ORDER — HEPARIN SODIUM (PORCINE) 5000 UNIT/ML IJ SOLN
5000.0000 [IU] | Freq: Three times a day (TID) | INTRAMUSCULAR | Status: DC
  Administered 2016-11-14 – 2016-11-17 (×8): 5000 [IU] via SUBCUTANEOUS
  Filled 2016-11-14 (×8): qty 1

## 2016-11-14 MED ORDER — GLIMEPIRIDE 2 MG PO TABS
2.0000 mg | ORAL_TABLET | Freq: Every day | ORAL | Status: DC
  Administered 2016-11-15 – 2016-11-20 (×5): 2 mg via ORAL
  Filled 2016-11-14 (×5): qty 1

## 2016-11-14 MED ORDER — SODIUM CHLORIDE 0.9% FLUSH
3.0000 mL | Freq: Two times a day (BID) | INTRAVENOUS | Status: DC
  Administered 2016-11-15: 3 mL via INTRAVENOUS

## 2016-11-14 MED ORDER — ASPIRIN 81 MG PO CHEW: 81.0000 mg | CHEWABLE_TABLET | Freq: Every day | ORAL | Status: DC

## 2016-11-14 MED ORDER — INSULIN ASPART 100 UNIT/ML ~~LOC~~ SOLN: 0.0000 [IU] | Freq: Every day | SUBCUTANEOUS | Status: DC

## 2016-11-14 MED ORDER — LIDOCAINE HCL (PF) 1 % IJ SOLN
INTRAMUSCULAR | Status: AC
  Filled 2016-11-14: qty 30

## 2016-11-14 MED ORDER — HEPARIN SODIUM (PORCINE) 1000 UNIT/ML IJ SOLN
INTRAMUSCULAR | Status: DC | PRN
  Administered 2016-11-14: 5000 [IU] via INTRAVENOUS

## 2016-11-14 MED ORDER — IOPAMIDOL (ISOVUE-370) INJECTION 76%
INTRAVENOUS | Status: AC
  Filled 2016-11-14: qty 50

## 2016-11-14 MED ORDER — SODIUM CHLORIDE 0.9% FLUSH
3.0000 mL | Freq: Two times a day (BID) | INTRAVENOUS | Status: DC
  Administered 2016-11-17 – 2016-11-20 (×5): 3 mL via INTRAVENOUS

## 2016-11-14 SURGICAL SUPPLY — 16 items
CATH 5FR JL3.5 JR4 ANG PIG MP (CATHETERS) ×1 IMPLANT
CATH BALLN WEDGE 5F 110CM (CATHETERS) ×1 IMPLANT
CATH VISTA GUIDE 6FR XBLAD3.5 (CATHETERS) ×1 IMPLANT
COVER PRB 48X5XTLSCP FOLD TPE (BAG) IMPLANT
COVER PROBE 5X48 (BAG) ×3
DEVICE RAD COMP TR BAND LRG (VASCULAR PRODUCTS) ×1 IMPLANT
GLIDESHEATH SLEND SS 6F .021 (SHEATH) ×1 IMPLANT
GUIDEWIRE INQWIRE 1.5J.035X260 (WIRE) IMPLANT
INQWIRE 1.5J .035X260CM (WIRE) ×3
KIT HEART LEFT (KITS) ×3 IMPLANT
KIT HEART RIGHT NAMIC (KITS) ×2 IMPLANT
PACK CARDIAC CATHETERIZATION (CUSTOM PROCEDURE TRAY) ×3 IMPLANT
SHEATH FAST CATH BRACH 5F 5CM (SHEATH) ×1 IMPLANT
TRANSDUCER W/STOPCOCK (MISCELLANEOUS) ×5 IMPLANT
TUBING CIL FLEX 10 FLL-RA (TUBING) ×3 IMPLANT
WIRE EMERALD 3MM-J .025X260CM (WIRE) ×1 IMPLANT

## 2016-11-14 NOTE — H&P (View-Only) (Signed)
Patient ID: Wayne [name], male   DOB: [birth year], 75 y.o.   MRN: [id number]   Advanced Heart Failure Clinic Note   Patient ID: Wayne [name], male   DOB: 1940/12/29, 75 y.o.   MRN: [id number] PCP: Dr. Elease Hashimoto Cardiology: Dr. Aundra Dubin  75 yo with history of DM, HTN, CAD, and ischemic cardiomyopathy presents for cardiology followup. Patient had a CHF exacerbation in 2/12 and was found to have LV systolic dysfunction with EF around 20%. LHC showed RCA, ramus, and CFX disease. RCA was subtotally occluded. Cardiac MRI showed that all walls, including the inferior wall, should be viable. Patient therefore underwent opening of his chronic totally occluded RCA as well as PCI to the ramus in 2/12. He received drug eluting stents and was on Effient.  He had an echo in 2/13 that showed EF improved to 45% with moderate LV dilation and mild LV hypertrophy.   In 5/13, he developed a large left lower leg hematoma.  He was still on Effient at that time.  ASA and Effient were stopped.  The hematoma did not resolve.  In 7/13, he was re-admitted with septic shock from MSSA from an abscess in his left gastrocnemius.  He also grew Pseudomonas from the left gastrocnemius as well.  He had a prolonged course in the hospital and later in a rehab facility.  Ultimately, he got back home again.  I had him get an echo in 1/14, and this showed EF 15% with diffuse hypokinesis and inferoposterior akinesis.  He had been off of most of his prior cardiac medications.  I took him back for [facility] in 1/14.  This showed subtotal occlusion of a small AV LCx and 95% ostial in-stent restenosis in the RCA.  He was treated in 2/14 with a Xience DES to the ostial RCA and begun on Plavix.  Unfortunately, he developed diffuse hives after starting Plavix and had to be switched to ticlopidine.  He has tolerated ticlopidine.   Echo done in 12/14 showed some improvement in LV function but EF was still low at 30-35%.  He did not want ICD.    Presented to ED  11/14/15 with worsening SOB after a few steps and CXR with CHF and bilateral effusions in the setting of marked medical non-compliance. States he stopped taking his medicines in 01/2015 (except for ASA 81 and a multivitamin).  He was feeling good and decided that he did not need them anymore.  Also c/o URI symptoms. Diuresed over 2 L with IV diuretics in the ER and started on lasix 20 mg daily.   Echo (12/16) showed EF 20-25% with diffuse hypokinesis (down from 35% in 12/15).  Echo 10/17 showed EF 15% with mildly decreased RV systolic function.   He has had some difficulty with medication compliance.  However, he has is now in the BEAT-HF trial medical arm and visits with the research nurse have really helped his medication compliance. He is currently taking meds as ordered.   He was seen at pharmacy clinic at [facility] recently.  He was taken off Toprol XL and started on metoprolol tartrate.  He was put on low dose pravastatin rather than Repatha.  CPX in 10/17 showed severe functional limitation due to heart failure.   No chest pain.  Weight is up 3 lbs. Recently, he has been hoarse and has had a cough with pink sputum.  ?Subjective fevers.  No dyspnea walking slowly on flat ground.  Short of breath if he hurries or  walks up a hill.  Feels like he is slowly getting worse.  Tight abdomen.  +Orthopnea, no PND.    Labs (12/16): K 4.9, creatinine 1.23 Labs (1/17): K 4.7, creatinine 1.15, BNP 1433 Labs (2/17): K 4.7, creatinine 1.14, BNP 870 Labs (3/17): K 4.5, creatinine 1.10, BNP 1257 Labs (5/17): K 4.2, creatinine 1.2, BNP 818 Labs (7/17): LDL 149, HDL 40, TGs 210 Labs (9/17): K 4.9, creatinine 1.36 Labs (10/17): K 3.7, creatinine 1.2, LDL 87, HDL 32, LFTs normal  Past Medical History:  1. HYPERTENSION  2. HYPERLIPIDEMIA: Myalgias with atorvastatin and Crestor 3. ECZEMA  4. RHINITIS  5. HERPES ZOSTER OPHTHALMICUS  6. ADENOCARCINOMA, PROSTATE: Status post prostatectomy in 2009. Has had some  incontinence since then.  7. Diabetes mellitus type II  8. Arthritis  9. Obesity  10. GERD: rare  11. CAD: Presented with exertional dyspnea, never had chest pain. LHC (2/12) with subtotalled proximal RCA and left to right collaterals, 90% proximal moderate-sized ramus, 95% proximal relatively small CFX, 40-50% proximal LAD. Cardiac MRI (2/12) showed EF 21%, some mild scar in basal segments but all wall segments would be expected to be viable. DES x 5 (overlapping) to RCA, DES x 1 to RI 01/30/11 .  Bled into leg with Effient use (long, complicated course).  LHC (1/14): AV LCx small with subtotal occlusion, 95% ostial instent restenosis in RCA. PCI in 2/14 to ostial RCA with 3.5 x 15 Xience DES.  Plavix allergy (hives) so put on ticlopidine.  12. Ischemic CMP: Echo (2/12) with moderately dilated LV, EF about 20% with diffuse hypokinesis and inferior akinesis, pseudonormal diastolic function, mild MR, severe LAE, mildly decreased RV systolic function. RHC (2/12) with mean RA 12, PA 40/25, mean PCWP 26, CI 2.1.  Echo (5/12) with EF 40% (appeared worse to my eye) with posterior HK, basal inferior AK, inferoseptal AK, basal anteroseptal AK, mild MR.  Cardiac MRI was repeated and showed EF 32% (improved from 21%) and mild LV dilation (was severely dilated before) with diffuse hypokinesis and subendocardial scar in the basal inferior, basal posterior, and basal anterolateral segments.  Echo (2/13) with EF 45%, moderate LV dilation, mild LVH.  Echo (1/14) with EF 15%, diffuse hypokinesis, inferoposterior akinesis, mild MR, grade I diastolic dysfunction.  Echo (5/14) with EF 30-35%, mild LV dilation, akinesis of the basal inferior wall otherwise diffuse hypokinesis.  Echo (12/14) with EF 30-35%, mild LV dilation, diffuse hypokinesis with basal inferior and posterior akinesis. Echo (12/15) with EF 35%, mildly dilated LV, wall motion abnormalities, normal RV size and systolic function. Echo (12/16) with EF 20-25%, diffuse  hypokinesis, severe LV dilation, mild MR.  - Echo (10/17): EF 15%, grade II diastolic dysfunction, mild MR, mildly decreased RV systolic function.  - Possible angioedema related to Entresto.  - Hyperkalemia with spironolactone 12.5 daily.  - CPX (10/17): peak VO2 10.6, VE/VCO2 slope 48.6, RER 1.12.  Severe HF limitation.  13. Cervical OA.  14. Polymyalgia rheumatica 15. OSA: Severe on sleep study 10/12.  On CPAP.  16. Left lower leg hematoma in setting of Effient use.  He developed a left gastrocnemius abscess and septic shock with prolonged hospitalization beginning in 7/13.  17. PAD: Lower extremity arterial doppler evaluation in 2/17 showed occluded peroneal arteries bilaterally, ABI 1.1 (R) and 1.0 (L).   18. Carotid dopplers (10/17) with minimal disease.   Family History:  Father died with MI at age 51. He was an alcoholic. Mother died with cancer at around 46.  Social History:  Occupation: retired Administrator  Married, lives in [place]  Past smoker, quit around [place]: all systems reviewed and negative except as per HPI.    Current Outpatient Prescriptions  Medication Sig Dispense Refill  . aspirin EC 81 MG tablet Take 81 mg by mouth daily.    . furosemide (LASIX) 80 MG tablet Take 1 tablet (80 mg total) by mouth 2 (two) times daily. 60 tablet 3  . glimepiride (AMARYL) 2 MG tablet Take 1 tablet (2 mg total) by mouth daily before breakfast. 30 tablet 3  . glucose blood (ACCU-CHEK AVIVA) test strip Check blood sugars 1-2 times per day. DX: E11.65 200 each 5  . Lancets (ACCU-CHEK SOFT TOUCH) lancets Check blood sugars 1-2 times per day. DX: E11.65 200 each 5  . losartan (COZAAR) 25 MG tablet Take 0.5 tablets (12.5 mg total) by mouth 2 (two) times daily. 30 tablet 3  . Multiple Vitamins-Minerals (CENTRUM SILVER ADULT 50+) TABS Take 1 tablet by mouth daily.    . pravastatin (PRAVACHOL) 20 MG tablet Take 1 tablet (20 mg total) by mouth every evening. 30 tablet 11  .  carvedilol (COREG) 3.125 MG tablet Take 1 tablet (3.125 mg total) by mouth 2 (two) times daily. 60 tablet 3  . doxycycline (VIBRAMYCIN) 100 MG capsule Take 1 capsule (100 mg total) by mouth 2 (two) times daily. 14 capsule 0   No current facility-administered medications for this encounter.     BP 122/70   Pulse 83   Wt 236 lb (107 kg)   SpO2 98%   BMI 33.86 kg/m    Wt Readings from Last 3 Encounters:  11/10/16 236 lb (107 kg)  11/05/16 234 lb (106.1 kg)  10/15/16 236 lb (107 kg)    General: NAD, obese.  Neck: Thick, JVP 9-10 cm, no thyromegaly or thyroid nodule. No carotid bruit.  HEENT: Normal.  Lungs: Bilateral rhonchi CV: Nondisplaced PMI. Heart regular S1/S2, no S3/S4, no murmur. Difficult to palpate pedal pulses.  Abdomen: Soft, NT, mild/moderate distention, no HSM. No bruits or masses. +BS  Neurologic: Alert and oriented x 3.  Psych: Pleasant affect.  Extremities: No clubbing or cyanosis.1+ ankle edema.  Assessment/Plan:  1. Chronic systolic CHF: Ischemic cardiomyopathy.  10/17 echo with EF 15%, diffuse hypokinesis.  NYHA class III symptoms, slowly worsening.  CPX in 10/17 with severe HF limitation.  He is volume overloaded on exam and weight is up.  Doing better with medication compliance.  - Increase Lasix to 80 mg bid with BMET/BNP today and again in 1 week.  - Off spironolactone with elevated K.  - Continue losartan 12.5 mg bid. Of note, patient had possible angioedema related to [facility]. - Stop metoprolol tartrate, start Coreg 3.125 mg bid.    - I am concerned about his trajectory, CPX suggests that he may be nearing need for advanced therapies.  I am going to arrange for RHC/LHC to assess filling pressures, cardiac output, and to look at coronaries for any potential interventional option. - We discussed ICD again today.  He wants to do cath first.  He would not be a CRT candidate with narrow QRS.  - He may be an LVAD candidate in the future.  As above, compliance  has improved.  He will meet our LVAD nurse today.  - He has been referred to cardiac rehab.  2. CAD: No CP.  Last PCI 01/2013. Completed his year-long course of ticlopidine.  He was allergic to Plavix  and had severe gastrocnemius bleed with Effient. - Continue ASA 81 and pravastatin 20 mg daily. - As above, plan RHC/LHC later this week.  3. HLD: Has had myalgias with Crestor and atorvastatin.  He is now on pravastatin 20 mg daily.  LDL is improved, not far from goal. 4. PAD: Occluded peroneal arteries bilaterally.  No significant claudication.  5. Diabetes type II: Unable to get empagliflozin.  6. ID: Hoarse, subjective fevers, rhonchi on lung exam.  Possible acute bronchitis versus PNA (versus just volume overload).  I will get CXR PA/lateral and give him a course of doxycycline 100 mg bid.   Followup in 1 month.    Loralie Champagne  11/10/2016

## 2016-11-14 NOTE — Progress Notes (Signed)
Sheath pull note: 2fr brachial venous sheath removed from right brachial vein. Manual pressure held for 10 minutes to achieve hemostasis. VSS. Will continue to monitor.

## 2016-11-14 NOTE — Interval H&P Note (Signed)
Cath Lab Visit (complete for each Cath Lab visit)  Clinical Evaluation Leading to the Procedure:   ACS: No.  Non-ACS:    Anginal Classification: CCS III  Anti-ischemic medical therapy: Minimal Therapy (1 class of medications)  Non-Invasive Test Results: No non-invasive testing performed  Prior CABG: No previous CABG      History and Physical Interval Note:  11/14/2016 10:12 AM  Kelton Pillar  has presented today for surgery, with the diagnosis of hf  The various methods of treatment have been discussed with the patient and family. After consideration of risks, benefits and other options for treatment, the patient has consented to  Procedure(s): Right/Left Heart Cath and Coronary Angiography (N/A) as a surgical intervention .  The patient's history has been reviewed, patient examined, no change in status, stable for surgery.  I have reviewed the patient's chart and labs.  Questions were answered to the patient's satisfaction.     Mable Lashley [employer]

## 2016-11-14 NOTE — Progress Notes (Signed)
Patient was admitted s/p heart cath around 1345H and left the floor for MRI after 30 mins. No apparent distress noted. He denied any discomfort. Rt wrist TR band has zero air per report. Unable to removed because patient went to MRI. Endorsed to [place] for removal when patient comes back to the floor.

## 2016-11-15 ENCOUNTER — Inpatient Hospital Stay (HOSPITAL_COMMUNITY): Payer: Medicare Other

## 2016-11-15 LAB — GLUCOSE, CAPILLARY
GLUCOSE-CAPILLARY: 167 mg/dL — AB (ref 65–99)
GLUCOSE-CAPILLARY: 121 mg/dL — AB (ref 65–99)
Glucose-Capillary: 132 mg/dL — ABNORMAL HIGH (ref 65–99)
Glucose-Capillary: 122 mg/dL — ABNORMAL HIGH (ref 65–99)

## 2016-11-15 LAB — BASIC METABOLIC PANEL
ANION GAP: 10 (ref 5–15)
ANION GAP: 10 (ref 5–15)
BUN: 23 mg/dL — ABNORMAL HIGH (ref 6–20)
BUN: 23 mg/dL — ABNORMAL HIGH (ref 6–20)
CALCIUM: 9.1 mg/dL (ref 8.9–10.3)
CHLORIDE: 104 mmol/L (ref 101–111)
CHLORIDE: 103 mmol/L (ref 101–111)
CO2: 26 mmol/L (ref 22–32)
CO2: 26 mmol/L (ref 22–32)
CREATININE: 1.24 mg/dL (ref 0.61–1.24)
Calcium: 9.3 mg/dL (ref 8.9–10.3)
Creatinine, Ser: 1.23 mg/dL (ref 0.61–1.24)
GFR calc non Af Amer: 57 mL/min — ABNORMAL LOW (ref >60)
GFR calc non Af Amer: 56 mL/min — ABNORMAL LOW (ref >60)
Glucose, Bld: 118 mg/dL — ABNORMAL HIGH (ref 65–99)
Glucose, Bld: 131 mg/dL — ABNORMAL HIGH (ref 65–99)
POTASSIUM: 3.9 mmol/L (ref 3.5–5.1)
Potassium: 3.7 mmol/L (ref 3.5–5.1)
SODIUM: 139 mmol/L (ref 135–145)
Sodium: 140 mmol/L (ref 135–145)

## 2016-11-15 LAB — CBC
HEMATOCRIT: 37.8 % — AB (ref 39.0–52.0)
HEMOGLOBIN: 12 g/dL — AB (ref 13.0–17.0)
MCH: 27.6 pg (ref 26.0–34.0)
MCHC: 31.7 g/dL (ref 30.0–36.0)
MCV: 87.1 fL (ref 78.0–100.0)
Platelets: 174 10*3/uL (ref 150–400)
RBC: 4.34 MIL/uL (ref 4.22–5.81)
RDW: 14.6 % (ref 11.5–15.5)
WBC: 6.8 10*3/uL (ref 4.0–10.5)

## 2016-11-15 MED ORDER — FUROSEMIDE 10 MG/ML IJ SOLN
80.0000 mg | Freq: Two times a day (BID) | INTRAMUSCULAR | Status: DC
  Administered 2016-11-15 – 2016-11-16 (×3): 80 mg via INTRAVENOUS
  Filled 2016-11-15 (×2): qty 8

## 2016-11-15 MED ORDER — SPIRONOLACTONE 25 MG PO TABS
12.5000 mg | ORAL_TABLET | Freq: Every day | ORAL | Status: DC
  Administered 2016-11-15 – 2016-11-18 (×3): 12.5 mg via ORAL
  Filled 2016-11-15 (×3): qty 1

## 2016-11-15 NOTE — Plan of Care (Signed)
Problem: Pain Managment: Goal: General experience of comfort will improve Outcome: Progressing No complaints on pain this pm  Problem: Activity: Goal: Risk for activity intolerance will decrease Outcome: Progressing Pt up to BR several times this pm

## 2016-11-15 NOTE — Progress Notes (Signed)
Patient ID: Wayne [name], male   DOB: 1941-03-11, 75 y.o.   MRN: [id number]    SUBJECTIVE: Still short of breath with ambulation, no chest pain.  +Cough.    Left/right heart cath (12/1):  Left Main  Eccentric calcification with 40-50% ostial stenosis.  Left Anterior Descending  95% calcified proximal LAD stenosis at the take-off of a small D1. 70-80% proximal LAD stenosis following the first lesion, just after the 2nd septal perforator.  Ramus Intermedius  Patent stent. No significant disease.  Left Circumflex  Subtotal occlusion of a small AV LCx. This was seen in the past.  Right Coronary Artery  Long stented segment from ostial RCA to distal RCA. Total occlusion of the RCA starting at the ostium. The PDA and PLV fill via right-right and left-right collaterals.    Right Heart Pressures RHC Procedural Findings: Hemodynamics (mmHg) RA mean 14 RV 49/18 PA 53/27, mean 38 PCWP mean 25 LV 98/25  Oxygen saturations: PA 57% AO 96%  Cardiac Output (Fick) 4.32  Cardiac Index (Fick) 1.93      Scheduled Meds: . aspirin  81 mg Oral Daily  . carvedilol  3.125 mg Oral BID WC  . digoxin  0.125 mg Oral Daily  . doxycycline  100 mg Oral Q12H  . furosemide  80 mg Intravenous BID  . glimepiride  2 mg Oral Q breakfast  . heparin  5,000 Units Subcutaneous Q8H  . insulin aspart  0-15 Units Subcutaneous TID WC  . insulin aspart  0-5 Units Subcutaneous QHS  . losartan  12.5 mg Oral BID  . potassium chloride  40 mEq Oral Daily  . pravastatin  20 mg Oral q1800  . sodium chloride flush  3 mL Intravenous Q12H  . sodium chloride flush  3 mL Intravenous Q12H  . sodium chloride flush  3 mL Intravenous Q12H  . spironolactone  12.5 mg Oral Daily   Continuous Infusions: PRN Meds:.sodium chloride, sodium chloride, sodium chloride, acetaminophen, ondansetron (ZOFRAN) IV, sodium chloride flush, sodium chloride flush, sodium chloride flush   Vitals:   11/14/16 1225 11/14/16 1347 11/14/16 2019  11/15/16 0636  BP: 108/70 (!) 103/51 113/66 111/80  Pulse: 79 83 91 93  Resp: 16 18 20 20   Temp:  98.1 F (36.7 C) 97.6 F (36.4 C) 97.6 F (36.4 C)  TempSrc:  Oral Oral Oral  SpO2: 93% 97% 95% 95%  Weight:    231 lb 4.8 oz (104.9 kg)  Height:       No intake or output data in the 24 hours ending 11/15/16 0908  LABS: Basic Metabolic Panel:  Recent Labs  11/14/16 0723 11/15/16 0300  NA 139 140  K 3.9 3.9  CL 106 104  CO2 23 26  GLUCOSE 216* 118*  BUN 25* 23*  CREATININE 1.26* 1.23  CALCIUM 9.9 9.1   Liver Function Tests: No results for input(s): AST, ALT, ALKPHOS, BILITOT, PROT, ALBUMIN in the last 72 hours. No results for input(s): LIPASE, AMYLASE in the last 72 hours. CBC:  Recent Labs  11/14/16 0723 11/15/16 0300  WBC 7.7 6.8  HGB 13.0 12.0*  HCT 40.0 37.8*  MCV 86.6 87.1  PLT 195 174   Cardiac Enzymes: No results for input(s): CKTOTAL, CKMB, CKMBINDEX, TROPONINI in the last 72 hours. BNP: Invalid input(s): POCBNP D-Dimer: No results for input(s): DDIMER in the last 72 hours. Hemoglobin A1C: No results for input(s): HGBA1C in the last 72 hours. Fasting Lipid Panel: No results for input(s): CHOL, HDL, LDLCALC,  TRIG, CHOLHDL, LDLDIRECT in the last 72 hours. Thyroid Function Tests:  Recent Labs  11/14/16 1614  TSH 0.932   Anemia Panel: No results for input(s): VITAMINB12, FOLATE, FERRITIN, TIBC, IRON, RETICCTPCT in the last 72 hours.  RADIOLOGY: Dg Chest 2 View  Result Date: 11/10/2016 CLINICAL DATA:  75 year old male with a history of cough and fever EXAM: CHEST  2 VIEW COMPARISON:  11/14/2015 FINDINGS: Cardiomediastinal silhouette unchanged. No pneumothorax.  No pleural effusion. Compared to prior plain film there has been improved aeration at the lung bases. Coarsened interstitial markings, similar to prior. Evidence of native coronary atherosclerosis. No confluent airspace disease. IMPRESSION: Chronic lung changes without evidence of lobar  pneumonia. Compared to the prior plain film there is improved aeration. Aortic atherosclerosis.  Native coronary artery disease. Signed, Dulcy Fanny. Earleen Newport, DO [facility] Electronically Signed   By: Corrie Mckusick D.O.   On: 11/10/2016 12:49    PHYSICAL EXAM General: NAD Neck: JVP 12-13 cm, no thyromegaly or thyroid nodule.  Lungs: Clear to auscultation bilaterally with normal respiratory effort. CV: Nondisplaced PMI.  Heart regular S1/S2, no S3/S4, no murmur.  1+ ankle edema.  Abdomen: Soft, nontender, no hepatosplenomegaly, no distention.  Neurologic: Alert and oriented x 3.  Psych: Normal affect. Extremities: No clubbing or cyanosis.   TELEMETRY: Reviewed telemetry pt in NSR  ASSESSMENT AND PLAN: 75 yo with history of chronic systolic CHF/ischemic cardiomyopathy and CAD was admitted after left/right heart cath yesterday.  1. Acute on chronic systolic CHF: Ischemic cardiomyopathy.  10/17 echo with EF 15%, diffuse hypokinesis.  NYHA class III symptoms, slowly worsening.  CPX in 10/17 with severe HF limitation.  RHC yesterday showed ongoing volume overload with marginal cardiac index, 1.9.  He is volume overloaded on exam today.  - Diurese with Lasix 80 mg IV bid.  - Continue low dose Coreg, would not increase with low output.  - Continue losartan. - Start spironolactone 12.5 mg daily.  - Digoxin begun.   - Will need to review cardiac MRI.  If LAD territory is viable, will proceed with PCI to LAD on Monday (high risk).  I suspect RCA could not be revascularized percutaneously, and CABG would be very high risk.  - If he does not have significant improvement with PCI to LAD, he may be nearing the need for LVAD consideration.   - He does not have an ICD.  Has refused in the past but would likely be willing to have one at this point. Would have to wait 3 months post-revascularization of LAD to repeat echo.  Will discuss Lifevest with  him. 2. CAD: No CP.  Last PCI 01/2013. Completed his year-long course of ticlopidine. He was allergic to Plavix and had severe gastrocnemius bleed with Effient. See cath above, severe disease with occlusion of RCA throughout the entire long stented segment and severe calcified proximal LAD disease.   - Plan PCI to LAD. Will be relatively high risk given his low EF/low output, will likely need Impella support. I have discussed with Dr. Martinique who will do the case on Monday.  I do not think we could successfully fix the RCA percutaneously, and CABG would be very high risk.  - Continue ASA 81 and pravastatin 20 mg daily. - Ticlopidine is no longer available, will have to use Brilinta with PCI.   3. HLD: Has had myalgias with Crestor and atorvastatin.  He is now on pravastatin 20 mg daily.  LDL was improved, not  far from goal. 4. PAD: Occluded peroneal arteries bilaterally.  No significant claudication.  5. Diabetes type II: Unable to get empagliflozin.  6. ID: He has been on a course of doxycycline planned for 1 week for acute bronchitis.   Loralie Champagne 11/15/2016

## 2016-11-16 LAB — CBC
HEMATOCRIT: 38.2 % — AB (ref 39.0–52.0)
HEMOGLOBIN: 12.2 g/dL — AB (ref 13.0–17.0)
MCH: 27.7 pg (ref 26.0–34.0)
MCHC: 31.9 g/dL (ref 30.0–36.0)
MCV: 86.8 fL (ref 78.0–100.0)
Platelets: 194 10*3/uL (ref 150–400)
RBC: 4.4 MIL/uL (ref 4.22–5.81)
RDW: 14.3 % (ref 11.5–15.5)
WBC: 8 10*3/uL (ref 4.0–10.5)

## 2016-11-16 LAB — BASIC METABOLIC PANEL
ANION GAP: 11 (ref 5–15)
BUN: 22 mg/dL — ABNORMAL HIGH (ref 6–20)
CHLORIDE: 102 mmol/L (ref 101–111)
CO2: 26 mmol/L (ref 22–32)
Calcium: 8.7 mg/dL — ABNORMAL LOW (ref 8.9–10.3)
Creatinine, Ser: 1.18 mg/dL (ref 0.61–1.24)
GFR calc Af Amer: >60 mL/min (ref >60)
GFR, EST NON AFRICAN AMERICAN: 59 mL/min — AB (ref >60)
GLUCOSE: 108 mg/dL — AB (ref 65–99)
POTASSIUM: 3.5 mmol/L (ref 3.5–5.1)
Sodium: 139 mmol/L (ref 135–145)

## 2016-11-16 LAB — GLUCOSE, CAPILLARY
GLUCOSE-CAPILLARY: 132 mg/dL — AB (ref 65–99)
GLUCOSE-CAPILLARY: 150 mg/dL — AB (ref 65–99)
GLUCOSE-CAPILLARY: 133 mg/dL — AB (ref 65–99)
Glucose-Capillary: 132 mg/dL — ABNORMAL HIGH (ref 65–99)

## 2016-11-16 MED ORDER — TICAGRELOR 90 MG PO TABS
180.0000 mg | ORAL_TABLET | Freq: Once | ORAL | Status: AC
  Administered 2016-11-17: 180 mg via ORAL
  Filled 2016-11-16 (×2): qty 2

## 2016-11-16 MED ORDER — SODIUM CHLORIDE 0.9% FLUSH
3.0000 mL | Freq: Two times a day (BID) | INTRAVENOUS | Status: DC
  Administered 2016-11-16: 3 mL via INTRAVENOUS

## 2016-11-16 MED ORDER — SODIUM CHLORIDE 0.9 % IV SOLN
INTRAVENOUS | Status: DC
  Administered 2016-11-17: 06:00:00 via INTRAVENOUS

## 2016-11-16 MED ORDER — FUROSEMIDE 10 MG/ML IJ SOLN: 80.0000 mg | Freq: Two times a day (BID) | INTRAMUSCULAR | Status: DC

## 2016-11-16 MED ORDER — SODIUM CHLORIDE 0.9% FLUSH: 3.0000 mL | INTRAVENOUS | Status: DC | PRN

## 2016-11-16 MED ORDER — SODIUM CHLORIDE 0.9 % IV SOLN
250.0000 mL | INTRAVENOUS | Status: DC | PRN
Start: 2016-11-16 — End: 2016-11-17

## 2016-11-16 MED ORDER — ASPIRIN 81 MG PO CHEW
81.0000 mg | CHEWABLE_TABLET | ORAL | Status: AC
  Administered 2016-11-17: 81 mg via ORAL
  Filled 2016-11-16: qty 1

## 2016-11-16 MED ORDER — ASPIRIN 81 MG PO CHEW
81.0000 mg | CHEWABLE_TABLET | Freq: Every day | ORAL | Status: DC
  Administered 2016-11-18 – 2016-11-20 (×3): 81 mg via ORAL
  Filled 2016-11-16 (×3): qty 1

## 2016-11-16 MED ORDER — FUROSEMIDE 10 MG/ML IJ SOLN
80.0000 mg | Freq: Two times a day (BID) | INTRAMUSCULAR | Status: AC
  Administered 2016-11-16: 80 mg via INTRAVENOUS
  Filled 2016-11-16: qty 8

## 2016-11-16 NOTE — Progress Notes (Addendum)
Patient ID: Wayne [name], male   DOB: [birth year], 75 y.o.   MRN: [id number]    SUBJECTIVE: Breathing getting better.  No chest pain.    Left/right heart cath (12/1):  Left Main  Eccentric calcification with 40-50% ostial stenosis.  Left Anterior Descending  95% calcified proximal LAD stenosis at the take-off of a small D1. 70-80% proximal LAD stenosis following the first lesion, just after the 2nd septal perforator.  Ramus Intermedius  Patent stent. No significant disease.  Left Circumflex  Subtotal occlusion of a small AV LCx. This was seen in the past.  Right Coronary Artery  Long stented segment from ostial RCA to distal RCA. Total occlusion of the RCA starting at the ostium. The PDA and PLV fill via right-right and left-right collaterals.    Right Heart Pressures RHC Procedural Findings: Hemodynamics (mmHg) RA mean 14 RV 49/18 PA 53/27, mean 38 PCWP mean 25 LV 98/25  Oxygen saturations: PA 57% AO 96%  Cardiac Output (Fick) 4.32  Cardiac Index (Fick) 1.93      Scheduled Meds: . aspirin  81 mg Oral Daily  . carvedilol  3.125 mg Oral BID WC  . digoxin  0.125 mg Oral Daily  . doxycycline  100 mg Oral Q12H  . furosemide  80 mg Intravenous BID  . glimepiride  2 mg Oral Q breakfast  . heparin  5,000 Units Subcutaneous Q8H  . insulin aspart  0-15 Units Subcutaneous TID WC  . insulin aspart  0-5 Units Subcutaneous QHS  . losartan  12.5 mg Oral BID  . potassium chloride  40 mEq Oral Daily  . pravastatin  20 mg Oral q1800  . sodium chloride flush  3 mL Intravenous Q12H  . sodium chloride flush  3 mL Intravenous Q12H  . sodium chloride flush  3 mL Intravenous Q12H  . spironolactone  12.5 mg Oral Daily   Continuous Infusions: PRN Meds:.sodium chloride, sodium chloride, sodium chloride, acetaminophen, ondansetron (ZOFRAN) IV, sodium chloride flush, sodium chloride flush, sodium chloride flush   Vitals:   11/15/16 1330 11/15/16 2051 11/16/16 0622 11/16/16 0901  BP: (!)  102/57 104/64 114/82 92/71  Pulse: 83 90 92 84  Resp: 18 18 18    Temp: 98.4 F (36.9 C) 97.7 F (36.5 C) 97.5 F (36.4 C)   TempSrc: Oral Oral Oral   SpO2: 98% 98% 97%   Weight:   227 lb 14.4 oz (103.4 kg)   Height:        Intake/Output Summary (Last 24 hours) at 11/16/16 1009 Last data filed at 11/15/16 2255  Gross per 24 hour  Intake             1080 ml  Output             1451 ml  Net             -371 ml    LABS: Basic Metabolic Panel:  Recent Labs  11/15/16 1100 11/16/16 0331  NA 139 139  K 3.7 3.5  CL 103 102  CO2 26 26  GLUCOSE 131* 108*  BUN 23* 22*  CREATININE 1.24 1.18  CALCIUM 9.3 8.7*   Liver Function Tests: No results for input(s): AST, ALT, ALKPHOS, BILITOT, PROT, ALBUMIN in the last 72 hours. No results for input(s): LIPASE, AMYLASE in the last 72 hours. CBC:  Recent Labs  11/15/16 0300 11/16/16 0331  WBC 6.8 8.0  HGB 12.0* 12.2*  HCT 37.8* 38.2*  MCV 87.1 86.8  PLT 174 194  Cardiac Enzymes: No results for input(s): CKTOTAL, CKMB, CKMBINDEX, TROPONINI in the last 72 hours. BNP: Invalid input(s): POCBNP D-Dimer: No results for input(s): DDIMER in the last 72 hours. Hemoglobin A1C: No results for input(s): HGBA1C in the last 72 hours. Fasting Lipid Panel: No results for input(s): CHOL, HDL, LDLCALC, TRIG, CHOLHDL, LDLDIRECT in the last 72 hours. Thyroid Function Tests:  Recent Labs  11/14/16 1614  TSH 0.932   Anemia Panel: No results for input(s): VITAMINB12, FOLATE, FERRITIN, TIBC, IRON, RETICCTPCT in the last 72 hours.  RADIOLOGY: Dg Chest 2 View  Result Date: 11/15/2016 CLINICAL DATA:  Congestive heart failure.  Hemoptysis. EXAM: CHEST  2 VIEW COMPARISON:  November 10, 2016 FINDINGS: Stable cardiomegaly. No overt edema. Mild atelectasis in the left base. No other acute abnormalities. IMPRESSION: No active cardiopulmonary disease. Electronically Signed   By: Dorise Bullion III M.D   On: 11/15/2016 11:16   Dg Chest 2  View  Result Date: 11/10/2016 CLINICAL DATA:  75 year old male with a history of cough and fever EXAM: CHEST  2 VIEW COMPARISON:  11/14/2015 FINDINGS: Cardiomediastinal silhouette unchanged. No pneumothorax.  No pleural effusion. Compared to prior plain film there has been improved aeration at the lung bases. Coarsened interstitial markings, similar to prior. Evidence of native coronary atherosclerosis. No confluent airspace disease. IMPRESSION: Chronic lung changes without evidence of lobar pneumonia. Compared to the prior plain film there is improved aeration. Aortic atherosclerosis.  Native coronary artery disease. Signed, Dulcy Fanny. Earleen Newport, DO [facility] Electronically Signed   By: Corrie Mckusick D.O.   On: 11/10/2016 12:49    PHYSICAL EXAM General: NAD Neck: JVP 10-12 cm, no thyromegaly or thyroid nodule.  Lungs: Clear to auscultation bilaterally with normal respiratory effort. CV: Nondisplaced PMI.  Heart regular S1/S2, no S3/S4, no murmur.  No edema.  Abdomen: Soft, nontender, no hepatosplenomegaly, no distention.  Neurologic: Alert and oriented x 3.  Psych: Normal affect. Extremities: No clubbing or cyanosis.   TELEMETRY: Reviewed telemetry pt in NSR  ASSESSMENT AND PLAN: 75 yo with history of chronic systolic CHF/ischemic cardiomyopathy and CAD was admitted after left/right heart cath yesterday.  1. Acute on chronic systolic CHF: Ischemic cardiomyopathy.  10/17 echo with EF 15%, diffuse hypokinesis.  NYHA class III symptoms, slowly worsening.  CPX in 10/17 with severe HF limitation.  RHC yesterday showed ongoing volume overload with marginal cardiac index, 1.9.  He is volume overloaded on exam today but improving, weight is down again.  - Diurese with Lasix 80 mg IV bid x 2 more doses today, no Lasix tomorrow prior to PCI.  - Continue low dose Coreg, would not increase with low output.  - Continue losartan. - Continue  spironolactone 12.5 mg daily.  - Digoxin begun.   - Will need to review cardiac MRI.  If LAD territory is viable, will proceed with PCI to LAD on Monday (high risk).  I suspect RCA could not be revascularized percutaneously, and CABG would be very high risk.  - If he does not have significant improvement with PCI to LAD, he may be nearing the need for LVAD consideration.   - He does not have an ICD.  Has refused in the past but would likely be willing to have one at this point. Would have to wait 3 months post-revascularization of LAD to repeat echo.  Will discuss Lifevest with him. 2. CAD: No CP.  Last PCI 01/2013. Completed his year-long course of ticlopidine. He was allergic  to Plavix and had severe gastrocnemius bleed with Effient. See cath above, severe disease with occlusion of RCA throughout the entire long stented segment and severe calcified proximal LAD disease.   - Plan PCI to LAD. Will be relatively high risk given his low EF/low output, will likely need Impella support. I have discussed with Dr. Martinique who will do the case on Monday.  I do not think we could successfully fix the RCA percutaneously, and CABG would be very high risk.  - Continue ASA 81 and pravastatin 20 mg daily. - Ticlopidine is no longer available, will have to use Brilinta with PCI.   3. HLD: Has had myalgias with Crestor and atorvastatin.  He is now on pravastatin 20 mg daily.  LDL was improved, not far from goal. 4. PAD: Occluded peroneal arteries bilaterally.  No significant claudication.  5. Diabetes type II: Unable to get empagliflozin.  6. ID: He has been on a course of doxycycline planned for 1 week for acute bronchitis.   Loralie Champagne 11/16/2016

## 2016-11-17 ENCOUNTER — Ambulatory Visit (HOSPITAL_COMMUNITY): Payer: Medicare Other

## 2016-11-17 ENCOUNTER — Ambulatory Visit: Payer: Medicare Other | Admitting: Family Medicine

## 2016-11-17 ENCOUNTER — Encounter (HOSPITAL_COMMUNITY)
Admission: RE | Disposition: A | Discharge status: Home / Self Care | Payer: Self-pay | Source: Ambulatory Visit | Attending: Cardiology

## 2016-11-17 ENCOUNTER — Encounter (HOSPITAL_COMMUNITY): Payer: Self-pay | Admitting: Cardiology

## 2016-11-17 DIAGNOSIS — E785 Hyperlipidemia, unspecified: Secondary | ICD-10-CM

## 2016-11-17 HISTORY — PX: CARDIAC CATHETERIZATION: SHX172

## 2016-11-17 LAB — BASIC METABOLIC PANEL
ANION GAP: 9 (ref 5–15)
BUN: 25 mg/dL — ABNORMAL HIGH (ref 6–20)
CHLORIDE: 102 mmol/L (ref 101–111)
CO2: 29 mmol/L (ref 22–32)
Calcium: 9.2 mg/dL (ref 8.9–10.3)
Creatinine, Ser: 1.39 mg/dL — ABNORMAL HIGH (ref 0.61–1.24)
GFR calc non Af Amer: 49 mL/min — ABNORMAL LOW (ref >60)
GFR, EST AFRICAN AMERICAN: 56 mL/min — AB (ref >60)
GLUCOSE: 234 mg/dL — AB (ref 65–99)
POTASSIUM: 4.3 mmol/L (ref 3.5–5.1)
Sodium: 140 mmol/L (ref 135–145)

## 2016-11-17 LAB — POCT ACTIVATED CLOTTING TIME
ACTIVATED CLOTTING TIME: 285 s
ACTIVATED CLOTTING TIME: 285 s
ACTIVATED CLOTTING TIME: 158 s
Activated Clotting Time: 274 seconds
Activated Clotting Time: 213 seconds

## 2016-11-17 LAB — GLUCOSE, CAPILLARY
GLUCOSE-CAPILLARY: 83 mg/dL (ref 65–99)
Glucose-Capillary: 136 mg/dL — ABNORMAL HIGH (ref 65–99)
Glucose-Capillary: 166 mg/dL — ABNORMAL HIGH (ref 65–99)

## 2016-11-17 LAB — BRAIN NATRIURETIC PEPTIDE: B Natriuretic Peptide: 2032 pg/mL — ABNORMAL HIGH (ref 0.0–100.0)

## 2016-11-17 LAB — CBC
HEMATOCRIT: 39.8 % (ref 39.0–52.0)
HEMOGLOBIN: 12.7 g/dL — AB (ref 13.0–17.0)
MCH: 27.9 pg (ref 26.0–34.0)
MCHC: 31.9 g/dL (ref 30.0–36.0)
MCV: 87.3 fL (ref 78.0–100.0)
Platelets: 206 10*3/uL (ref 150–400)
RBC: 4.56 MIL/uL (ref 4.22–5.81)
RDW: 14.3 % (ref 11.5–15.5)
WBC: 6.2 10*3/uL (ref 4.0–10.5)

## 2016-11-17 SURGERY — CORONARY STENT INTERVENTION W/IMPELLA
Anesthesia: LOCAL

## 2016-11-17 MED ORDER — IOPAMIDOL (ISOVUE-370) INJECTION 76%
INTRAVENOUS | Status: AC
  Filled 2016-11-17: qty 50

## 2016-11-17 MED ORDER — HEPARIN SODIUM (PORCINE) 1000 UNIT/ML IJ SOLN
INTRAMUSCULAR | Status: DC | PRN
  Administered 2016-11-17: 10000 [IU] via INTRAVENOUS

## 2016-11-17 MED ORDER — LIDOCAINE HCL (PF) 1 % IJ SOLN
INTRAMUSCULAR | Status: DC | PRN
  Administered 2016-11-17 (×2): 15 mL

## 2016-11-17 MED ORDER — MIDAZOLAM HCL 2 MG/2ML IJ SOLN
INTRAMUSCULAR | Status: AC
  Filled 2016-11-17: qty 2

## 2016-11-17 MED ORDER — NITROGLYCERIN 1 MG/10 ML FOR IR/CATH LAB
INTRA_ARTERIAL | Status: DC | PRN
  Administered 2016-11-17: 100 ug via INTRACORONARY
  Administered 2016-11-17 (×2): 200 ug via INTRACORONARY

## 2016-11-17 MED ORDER — SODIUM CHLORIDE 0.9% FLUSH: 3.0000 mL | INTRAVENOUS | Status: DC | PRN

## 2016-11-17 MED ORDER — IOPAMIDOL (ISOVUE-370) INJECTION 76%
INTRAVENOUS | Status: AC
  Filled 2016-11-17: qty 125

## 2016-11-17 MED ORDER — TICAGRELOR 90 MG PO TABS
90.0000 mg | ORAL_TABLET | Freq: Two times a day (BID) | ORAL | Status: DC
  Administered 2016-11-17 – 2016-11-20 (×6): 90 mg via ORAL
  Filled 2016-11-17 (×6): qty 1

## 2016-11-17 MED ORDER — SODIUM CHLORIDE 0.9 % IV SOLN: 250.0000 mL | INTRAVENOUS | Status: DC | PRN

## 2016-11-17 MED ORDER — LIDOCAINE HCL (PF) 1 % IJ SOLN
INTRAMUSCULAR | Status: AC
  Filled 2016-11-17: qty 30

## 2016-11-17 MED ORDER — SODIUM CHLORIDE 0.9% FLUSH
3.0000 mL | Freq: Two times a day (BID) | INTRAVENOUS | Status: DC
  Administered 2016-11-17 – 2016-11-20 (×5): 3 mL via INTRAVENOUS

## 2016-11-17 MED ORDER — HEPARIN (PORCINE) IN NACL 2-0.9 UNIT/ML-% IJ SOLN
INTRAMUSCULAR | Status: AC
  Filled 2016-11-17: qty 1000

## 2016-11-17 MED ORDER — MIDAZOLAM HCL 2 MG/2ML IJ SOLN
INTRAMUSCULAR | Status: DC | PRN
  Administered 2016-11-17: 1 mg via INTRAVENOUS
  Administered 2016-11-17: 2 mg via INTRAVENOUS

## 2016-11-17 MED ORDER — HEPARIN (PORCINE) IN NACL 2-0.9 UNIT/ML-% IJ SOLN
INTRAMUSCULAR | Status: DC | PRN
  Administered 2016-11-17: 18:00:00

## 2016-11-17 MED ORDER — ASPIRIN 81 MG PO CHEW
81.0000 mg | CHEWABLE_TABLET | Freq: Every day | ORAL | Status: DC
Start: 2016-11-17 — End: 2016-11-17

## 2016-11-17 MED ORDER — NITROGLYCERIN 1 MG/10 ML FOR IR/CATH LAB
INTRA_ARTERIAL | Status: AC
  Filled 2016-11-17: qty 10

## 2016-11-17 MED ORDER — HEPARIN SODIUM (PORCINE) 1000 UNIT/ML IJ SOLN
INTRAMUSCULAR | Status: AC
  Filled 2016-11-17: qty 1

## 2016-11-17 MED ORDER — FENTANYL CITRATE (PF) 100 MCG/2ML IJ SOLN
INTRAMUSCULAR | Status: AC
  Filled 2016-11-17: qty 2

## 2016-11-17 MED ORDER — IOPAMIDOL (ISOVUE-370) INJECTION 76%
INTRAVENOUS | Status: DC | PRN
  Administered 2016-11-17: 175 mL

## 2016-11-17 MED ORDER — SODIUM CHLORIDE 0.9% FLUSH
3.0000 mL | Freq: Two times a day (BID) | INTRAVENOUS | Status: DC
  Administered 2016-11-17 – 2016-11-20 (×6): 3 mL via INTRAVENOUS

## 2016-11-17 MED ORDER — HYDRALAZINE HCL 20 MG/ML IJ SOLN
5.0000 mg | INTRAMUSCULAR | Status: AC | PRN
Start: 2016-11-17 — End: 2016-11-18

## 2016-11-17 MED ORDER — HEPARIN (PORCINE) IN NACL 2-0.9 UNIT/ML-% IJ SOLN
INTRAMUSCULAR | Status: AC
  Filled 2016-11-17: qty 500

## 2016-11-17 MED ORDER — LABETALOL HCL 5 MG/ML IV SOLN: 10.0000 mg | INTRAVENOUS | Status: AC | PRN

## 2016-11-17 MED ORDER — FUROSEMIDE 10 MG/ML IJ SOLN
80.0000 mg | Freq: Two times a day (BID) | INTRAMUSCULAR | Status: DC
  Administered 2016-11-18 – 2016-11-19 (×3): 80 mg via INTRAVENOUS
  Filled 2016-11-17 (×3): qty 8

## 2016-11-17 MED ORDER — OXYCODONE-ACETAMINOPHEN 5-325 MG PO TABS
1.0000 | ORAL_TABLET | ORAL | Status: DC | PRN
  Administered 2016-11-17: 2 via ORAL
  Filled 2016-11-17: qty 2

## 2016-11-17 MED ORDER — FENTANYL CITRATE (PF) 100 MCG/2ML IJ SOLN
INTRAMUSCULAR | Status: DC | PRN
  Administered 2016-11-17 (×3): 25 ug via INTRAVENOUS

## 2016-11-17 SURGICAL SUPPLY — 33 items
ADDWIRE .014X145 (WIRE) ×2
BALLN EUPHORA RX 3.0X15 (BALLOONS) ×2
BALLN MOZEC 2.50X20 (BALLOONS) ×2
BALLN SPRINT LEG OTW 1.25X6 (BALLOONS) ×2
BALLN ~~LOC~~ EUPHORA RX 3.0X15 (BALLOONS) ×2
BALLN ~~LOC~~ EUPHORA RX 3.5X20 (BALLOONS) ×2
BALLOON EUPHORA RX 3.0X15 (BALLOONS) IMPLANT
BALLOON MOZEC 2.50X20 (BALLOONS) IMPLANT
BALLOON SPRINT LEG OTW 1.25X6 (BALLOONS) IMPLANT
BALLOON ~~LOC~~ EUPHORA RX 3.0X15 (BALLOONS) IMPLANT
BALLOON ~~LOC~~ EUPHORA RX 3.5X20 (BALLOONS) IMPLANT
CATH INFINITI 5FR ANG PIGTAIL (CATHETERS) ×1 IMPLANT
CATH VISTA GUIDE 6FR XBLAD3.5 (CATHETERS) ×1 IMPLANT
CATH VISTA GUIDE 6FR XBLAD4 (CATHETERS) ×1 IMPLANT
CROWN DIAMONDBACK CLASSIC 1.25 (BURR) ×1 IMPLANT
ELECT DEFIB PAD ADLT CADENCE (PAD) ×2 IMPLANT
EXTENSION ADDWIRE .014X145 (WIRE) IMPLANT
INTRODUCER PERFORM 14 30 .038 (SHEATH) ×1 IMPLANT
KIT ENCORE 26 ADVANTAGE (KITS) ×2 IMPLANT
KIT HEART LEFT (KITS) ×2 IMPLANT
PACK CARDIAC CATHETERIZATION (CUSTOM PROCEDURE TRAY) ×2 IMPLANT
SET IMPELLA CP PUMP (CATHETERS) ×1 IMPLANT
SET INTRODUCER MICROPUNCT 5F (INTRODUCER) ×1 IMPLANT
SHEATH PINNACLE 5F 10CM (SHEATH) ×1 IMPLANT
SHEATH PINNACLE 6F 10CM (SHEATH) ×1 IMPLANT
STENT RESOLUTE ONYX3.0X38 (Permanent Stent) ×1 IMPLANT
TRANSDUCER W/STOPCOCK (MISCELLANEOUS) ×2 IMPLANT
TUBING CIL FLEX 10 FLL-RA (TUBING) ×2 IMPLANT
WIRE ASAHI PROWATER 300CM (WIRE) ×1 IMPLANT
WIRE EMERALD 3MM-J .035X150CM (WIRE) ×1 IMPLANT
WIRE HI TORQ VERSACORE-J 145CM (WIRE) ×1 IMPLANT
WIRE MAILMAN 182CM (WIRE) ×1 IMPLANT
WIRE VIPER ADVANCE COR .012TIP (WIRE) ×1 IMPLANT

## 2016-11-17 NOTE — Progress Notes (Signed)
Call placed to on call cardiology.  Requested last time Pt could eat with scheduled cath @ 1:30 pm.  Pt is a diabetic.  Per on call, protocol is NPO for 6 hours prior to procedure.  Will allow Pt small snack @ 6 am d/t diabetes.

## 2016-11-17 NOTE — Consult Note (Signed)
[facility] Primary Care Navigator  11/17/2016  Wayne [name] 09-22-1941 [id number]  Met with patient and wife at the bedside to identify possible discharge needs. Patient endorses Wayne [name] with [facility] [place] as the primary care provider.    Patient shared using [facility] [employer]) to obtain medications without any problem.   Patient manages medications at home with wife's reminders as stated.  He is able to drive prior to admission. [name]) will provide transportation to doctors' appointments after discharge.  Wife is primary caregiver at home.   Discharge plan is home with wife's assistance.  Wife was reluctant for patient to see PCP after discharge since he will be following up with cardiologist anyway as stated. Explained to wife and patient the importance and benefits of following up with primary care provider once he goes home.  Patient voiced understanding to call primary care provider's office when he gets home, for a post discharge follow-up appointment within a week or sooner if needs arise. Patient letter provided as a reminder.  MD note states patient presented to ED with worsening SOB after few steps and CXR showed with CHF and bilateral effusions in the setting of marked medical non-compliance. Stated that he stopped taking his medicines in 01/2015 (except for ASA 81 and a multivitamin) since he was feeling good and decided that he did not need them anymore.  Will notify [facility] hospital liaison for follow-up and for disease management/ education as appropriate.  For additional questions please contact:  Edwena Felty A. Princess Karnes, BSN, RN-BC [facility] PRIMARY CARE Navigator Cell: [phone]

## 2016-11-17 NOTE — Progress Notes (Signed)
Patient ID: Wayne [name], male   DOB: 05/08/1941, 75 y.o.   MRN: [id number]    SUBJECTIVE: Weight down 10 lbs with diuresis, breathing much improved.  Creatinine stable.  No chest pain.  BNP persistently high.     Cardiac MRI (12/1):  1. Severely dilated left ventricle with EF 11%. Wall motion abnormalities as noted above. 2. Moderately dilated right ventricle with moderate systolic dysfunction. 3.  Moderate mitral regurgitation. 4. >50% thickness subendocardial LGE in the basal to mid inferior wall. These wall segments would be unlikely to show significant improvement with revascularization.  Left/right heart cath (12/1):  Left Main  Eccentric calcification with 40-50% ostial stenosis.  Left Anterior Descending  95% calcified proximal LAD stenosis at the take-off of a small D1. 70-80% proximal LAD stenosis following the first lesion, just after the 2nd septal perforator.  Ramus Intermedius  Patent stent. No significant disease.  Left Circumflex  Subtotal occlusion of a small AV LCx. This was seen in the past.  Right Coronary Artery  Long stented segment from ostial RCA to distal RCA. Total occlusion of the RCA starting at the ostium. The PDA and PLV fill via right-right and left-right collaterals.    Right Heart Pressures RHC Procedural Findings: Hemodynamics (mmHg) RA mean 14 RV 49/18 PA 53/27, mean 38 PCWP mean 25 LV 98/25  Oxygen saturations: PA 57% AO 96%  Cardiac Output (Fick) 4.32  Cardiac Index (Fick) 1.93      Scheduled Meds: . [START ON 11/18/2016] aspirin  81 mg Oral Daily  . carvedilol  3.125 mg Oral BID WC  . digoxin  0.125 mg Oral Daily  . doxycycline  100 mg Oral Q12H  . glimepiride  2 mg Oral Q breakfast  . heparin  5,000 Units Subcutaneous Q8H  . insulin aspart  0-15 Units Subcutaneous TID WC  . insulin aspart  0-5 Units Subcutaneous QHS  . losartan  12.5 mg Oral BID  . potassium chloride  40 mEq Oral Daily  . pravastatin  20 mg Oral q1800  .  sodium chloride flush  3 mL Intravenous Q12H  . sodium chloride flush  3 mL Intravenous Q12H  . sodium chloride flush  3 mL Intravenous Q12H  . sodium chloride flush  3 mL Intravenous Q12H  . spironolactone  12.5 mg Oral Daily  . ticagrelor  180 mg Oral Once   Continuous Infusions: . sodium chloride 10 mL/hr at 11/17/16 0555   PRN Meds:.sodium chloride, sodium chloride, sodium chloride, sodium chloride, acetaminophen, ondansetron (ZOFRAN) IV, sodium chloride flush, sodium chloride flush, sodium chloride flush, sodium chloride flush   Vitals:   11/16/16 0901 11/16/16 1315 11/16/16 2058 11/17/16 0548  BP: 92/71 (!) 105/56 101/60 105/69  Pulse: 84 90 85 87  Resp:  18  18  Temp:  98.2 F (36.8 C) 97.9 F (36.6 C) 98 F (36.7 C)  TempSrc:  Oral Oral Oral  SpO2:  97% 97% 97%  Weight:    225 lb 9.6 oz (102.3 kg)  Height:        Intake/Output Summary (Last 24 hours) at 11/17/16 0841 Last data filed at 11/17/16 [id number]  Gross per 24 hour  Intake             1260 ml  Output             1532 ml  Net             -272 ml    LABS: Basic Metabolic Panel:  Recent Labs  11/16/16 0331 11/17/16 0210  NA 139 140  K 3.5 4.3  CL 102 102  CO2 26 29  GLUCOSE 108* 234*  BUN 22* 25*  CREATININE 1.18 1.39*  CALCIUM 8.7* 9.2   Liver Function Tests: No results for input(s): AST, ALT, ALKPHOS, BILITOT, PROT, ALBUMIN in the last 72 hours. No results for input(s): LIPASE, AMYLASE in the last 72 hours. CBC:  Recent Labs  11/16/16 0331 11/17/16 0210  WBC 8.0 6.2  HGB 12.2* 12.7*  HCT 38.2* 39.8  MCV 86.8 87.3  PLT 194 206   Cardiac Enzymes: No results for input(s): CKTOTAL, CKMB, CKMBINDEX, TROPONINI in the last 72 hours. BNP: Invalid input(s): POCBNP D-Dimer: No results for input(s): DDIMER in the last 72 hours. Hemoglobin A1C: No results for input(s): HGBA1C in the last 72 hours. Fasting Lipid Panel: No results for input(s): CHOL, HDL, LDLCALC, TRIG, CHOLHDL, LDLDIRECT in the  last 72 hours. Thyroid Function Tests:  Recent Labs  11/14/16 1614  TSH 0.932   Anemia Panel: No results for input(s): VITAMINB12, FOLATE, FERRITIN, TIBC, IRON, RETICCTPCT in the last 72 hours.  RADIOLOGY: Dg Chest 2 View  Result Date: 11/15/2016 CLINICAL DATA:  Congestive heart failure.  Hemoptysis. EXAM: CHEST  2 VIEW COMPARISON:  November 10, 2016 FINDINGS: Stable cardiomegaly. No overt edema. Mild atelectasis in the left base. No other acute abnormalities. IMPRESSION: No active cardiopulmonary disease. Electronically Signed   By: Dorise Bullion III M.D   On: 11/15/2016 11:16   Dg Chest 2 View  Result Date: 11/10/2016 CLINICAL DATA:  75 year old male with a history of cough and fever EXAM: CHEST  2 VIEW COMPARISON:  11/14/2015 FINDINGS: Cardiomediastinal silhouette unchanged. No pneumothorax.  No pleural effusion. Compared to prior plain film there has been improved aeration at the lung bases. Coarsened interstitial markings, similar to prior. Evidence of native coronary atherosclerosis. No confluent airspace disease. IMPRESSION: Chronic lung changes without evidence of lobar pneumonia. Compared to the prior plain film there is improved aeration. Aortic atherosclerosis.  Native coronary artery disease. Signed, Dulcy Fanny. Earleen Newport, DO [facility] Electronically Signed   By: Corrie Mckusick D.O.   On: 11/10/2016 12:49   Mr Cardiac Morphology W Wo Contrast  Result Date: 11/16/2016 CLINICAL DATA:  Ischemic cardiomyopathy, assess for viability EXAM: CARDIAC MRI TECHNIQUE: The patient was scanned on a 1.5 Tesla GE magnet. A dedicated cardiac coil was used. Functional imaging was done using Fiesta sequences. 2,3, and 4 chamber views were done to assess for RWMA's. Modified Simpson's rule using a short axis stack was used to calculate an ejection fraction on a dedicated work Conservation officer, nature. The patient received 30 cc of  Multihance. After 10 minutes inversion recovery sequences were used to assess for infiltration and scar tissue. FINDINGS: Limited images of the lung fields showed a trivial left pleural effusion. The left ventricle is severely dilated with severe systolic dysfunction, EF [id number]. The lateral wall is hypokinetic but overall most preserved. The inferior wall, the septum, and the anterior wall appear severely hypokinetic to akinetic. The right ventricle is moderately dilated and moderately dysfunctional. Moderate biatrial enlargement. Trileaflet aortic valve with no stenosis or regurgitation. There is moderate mitral regurgitation, mild tricuspid regurgitation. On delayed enhancement imaging, there was > 50% wall thickness subendocardial late gadolinium enhancement (LGE) in the basal to mid inferior wall. MEASUREMENTS: MEASUREMENTS LVEDV 448 mL LV SV 49 mL LV EF 11% IMPRESSION: 1. Severely dilated left ventricle with EF  11%. Wall motion abnormalities as noted above. 2. Moderately dilated right ventricle with moderate systolic dysfunction. 3.  Moderate mitral regurgitation. 4. >50% thickness subendocardial LGE in the basal to mid inferior wall. These wall segments would be unlikely to show significant improvement with revascularization. Kelli Robeck Electronically Signed   By: Loralie Champagne M.D.   On: 11/16/2016 20:54    PHYSICAL EXAM General: NAD Neck: JVP 10-12 cm, no thyromegaly or thyroid nodule.  Lungs: Clear to auscultation bilaterally with normal respiratory effort. CV: Nondisplaced PMI.  Heart regular S1/S2, no S3/S4, no murmur.  No edema.  Abdomen: Soft, nontender, no hepatosplenomegaly, no distention.  Neurologic: Alert and oriented x 3.  Psych: Normal affect. Extremities: No clubbing or cyanosis.   TELEMETRY: Reviewed telemetry pt in NSR  ASSESSMENT AND PLAN: 75 yo with history of chronic systolic CHF/ischemic cardiomyopathy and CAD was admitted after left/right heart cath yesterday.  1. Acute  on chronic systolic CHF: Ischemic cardiomyopathy.  10/17 echo with EF 15%, diffuse hypokinesis.  NYHA class III symptoms, slowly worsening.  CPX in 10/17 with severe HF limitation.  RHC showed ongoing volume overload with marginal cardiac index, 1.9.  cMRI with EF 11%, has scar only in the basal to mid inferior wall.  Weight down 10 lbs but has persistent volume overload.  BNP remains elevated.  - Hold Lasix today prior to PCI, would resume IV Lasix tomorrow as he will need further diuresis.    - Continue low dose Coreg, would not increase with low output.  - Continue losartan. - Continue spironolactone 12.5 mg daily.  - Digoxin begun.   - LAD territory viable on cMRI, will proceed with PCI to LAD today (high risk).  I suspect RCA could not be revascularized percutaneously (also there is significant scar in basal to mid inferior wall, unlikely to show significant improvement with revascularization), and CABG would be very high risk.  - If he does not have significant improvement with PCI to LAD, he may be nearing the need for LVAD consideration.   - He does not have an ICD.  Has refused in the past but would likely be willing to have one at this point. Would have to wait 3 months post-revascularization of LAD to repeat echo.  Will discuss Lifevest with him. 2. CAD: No CP.  Last PCI 01/2013. Completed his year-long course of ticlopidine. He was allergic to Plavix and had severe gastrocnemius bleed with Effient. See cath above, severe disease with occlusion of RCA throughout the entire long stented segment and severe calcified proximal LAD disease.   - Plan PCI to LAD. Will be relatively high risk given his low EF/low output, will likely need Impella support. I have discussed with Dr. Martinique who will do the case on this afternoon.  Patient understands risks/benefits of PCI and agrees to proceed.  I do not think we could successfully fix the RCA percutaneously (basal to mid inferior wall probably nonviable),  and CABG would be very high risk.  - Continue ASA 81 and pravastatin 20 mg daily. - Ticlopidine is no longer available, will have to use Brilinta with PCI => 180 mg po ordered for on call to cath lab.   3. HLD: Has had myalgias with Crestor and atorvastatin.  He is now on pravastatin 20 mg daily.  LDL was improved, not far from goal. 4. PAD: Occluded peroneal arteries bilaterally.  No significant claudication.  5. Diabetes type II: Unable to get empagliflozin.  6. ID: He has been on  a course of doxycycline planned for 1 week for acute bronchitis => stop after today.   Loralie Champagne 11/17/2016

## 2016-11-17 NOTE — Progress Notes (Signed)
14 french sheath aspirated and removed from rfa. Manual pressure applied with handhold device for 30 minutes. Groin level 0, tegaderm dressing applied.   Ecchymosis present above left sheath due to bleeding at the surture site.  6Fr sheath aspirated and femoved from LFA, manual pressure applied for 20 minutes. Groin level 0 no S+S of hematoma, tegaderm dressing applied.   Bedrest instructions given. Right dp pulse palpable, right pt weaker. Left dp pulse weak but palpable, left pt intermittently palpable.  Bedrest begins at 10:25:00

## 2016-11-17 NOTE — Progress Notes (Signed)
Inpatient Diabetes Program Recommendations  AACE/ADA: New Consensus Statement on Inpatient Glycemic Control (2015)  Target Ranges:  Prepandial:   less than 140 mg/dL      Peak postprandial:   less than 180 mg/dL (1-2 hours)      Critically ill patients:  140 - 180 mg/dL   Lab Results  Component Value Date   GLUCAP 136 (H) 11/17/2016   HGBA1C 8.7 (H) 07/14/2016   Results for CHIP, DAVIN (MRN [id number]) as of 11/17/2016 09:35  Ref. Range 11/16/2016 11:13 11/16/2016 16:03 11/16/2016 21:27 11/17/2016 06:15  Glucose-Capillary Latest Ref Range: 65 - 99 mg/dL 132 (H) 150 (H) 133 (H) 136 (H)   Review of Glycemic Control  Diabetes history:        DM2, elevated BUN and Creatinine, Obesity Outpatient Diabetes medications:       Amaryl 2 mg Q am (started 10/31/16) Current orders for Inpatient glycemic control:       Amaryl 2 mg Q am, Novolog 0-15 units TIDAC and 0-5 units QHS, NPO  Inpatient Diabetes Program Recommendations:      A1C of 8.7% indicates an average CBG of 232 mg/dL.        Please consider changing Novolog to 0-15 units Q4H while NPO.  Thank you,  Windy Carina, RN, BSN Diabetes Coordinator Inpatient Diabetes Program [phone] (Team Pager)

## 2016-11-17 NOTE — Interval H&P Note (Signed)
History and Physical Interval Note:  11/17/2016 3:01 PM  DHAVAL RADCLIFF  has presented today for surgery, with the diagnosis of cad  The various methods of treatment have been discussed with the patient and family. After consideration of risks, benefits and other options for treatment, the patient has consented to  Procedure(s): Coronary Stent Intervention w/Impella (N/A) as a surgical intervention .  The patient's history has been reviewed, patient examined, no change in status, stable for surgery.  I have reviewed the patient's chart and labs.  Questions were answered to the patient's satisfaction.   Cath Lab Visit (complete for each Cath Lab visit)  Clinical Evaluation Leading to the Procedure:   ACS: No.  Non-ACS:    Anginal Classification: CCS III  Anti-ischemic medical therapy: Minimal Therapy (1 class of medications)  Non-Invasive Test Results: High-risk stress test findings: cardiac mortality >3%/year  Prior CABG: No previous CABG        Collier Salina [facility] 11/17/2016 3:03 PM

## 2016-11-17 NOTE — Consult Note (Signed)
   [facility] Inpatient Consult   11/17/2016  ARISTOTELIS GIOVANELLI 06-Oct-1941 [id number]   Referral received from [place] for [place] Management program services. Mr. Teasdale is currently in cath lab. Will follow up at later time to discuss [place] Management services.  Marthenia Rolling, MSN-Ed, RN,BSN [facility] Liaison [phone]

## 2016-11-17 NOTE — Care Management Note (Signed)
Case Management Note Marvetta Gibbons RN, BSN Unit 2W-Case Manager [phone]  Patient Details  Name: Wayne [name] MRN: [id number] Date of Birth: July 04, 1941  Subjective/Objective:  Pt admitted with acute on chronic HF, plan for cardiac cath 12/4                  Action/Plan: PTA pt lived at home with spouse- CM to follow for d/c needs.   Expected Discharge Date:                  Expected Discharge Plan:  Home/Self Care  In-House Referral:     Discharge planning Services  CM Consult  Post Acute Care Choice:    Choice offered to:     DME Arranged:    DME Agency:     HH Arranged:    HH Agency:     Status of Service:  In process, will continue to follow  If discussed at Long Length of Stay Meetings, dates discussed:    Additional Comments:  Dawayne Patricia, RN 11/17/2016, 11:00 AM

## 2016-11-17 NOTE — H&P (View-Only) (Signed)
Patient ID: Wayne [name], male   DOB: 12-15-1941, 75 y.o.   MRN: [id number]    SUBJECTIVE: Weight down 10 lbs with diuresis, breathing much improved.  Creatinine stable.  No chest pain.  BNP persistently high.     Cardiac MRI (12/1):  1. Severely dilated left ventricle with EF 11%. Wall motion abnormalities as noted above. 2. Moderately dilated right ventricle with moderate systolic dysfunction. 3.  Moderate mitral regurgitation. 4. >50% thickness subendocardial LGE in the basal to mid inferior wall. These wall segments would be unlikely to show significant improvement with revascularization.  Left/right heart cath (12/1):  Left Main  Eccentric calcification with 40-50% ostial stenosis.  Left Anterior Descending  95% calcified proximal LAD stenosis at the take-off of a small D1. 70-80% proximal LAD stenosis following the first lesion, just after the 2nd septal perforator.  Ramus Intermedius  Patent stent. No significant disease.  Left Circumflex  Subtotal occlusion of a small AV LCx. This was seen in the past.  Right Coronary Artery  Long stented segment from ostial RCA to distal RCA. Total occlusion of the RCA starting at the ostium. The PDA and PLV fill via right-right and left-right collaterals.    Right Heart Pressures RHC Procedural Findings: Hemodynamics (mmHg) RA mean 14 RV 49/18 PA 53/27, mean 38 PCWP mean 25 LV 98/25  Oxygen saturations: PA 57% AO 96%  Cardiac Output (Fick) 4.32  Cardiac Index (Fick) 1.93      Scheduled Meds: . [START ON 11/18/2016] aspirin  81 mg Oral Daily  . carvedilol  3.125 mg Oral BID WC  . digoxin  0.125 mg Oral Daily  . doxycycline  100 mg Oral Q12H  . glimepiride  2 mg Oral Q breakfast  . heparin  5,000 Units Subcutaneous Q8H  . insulin aspart  0-15 Units Subcutaneous TID WC  . insulin aspart  0-5 Units Subcutaneous QHS  . losartan  12.5 mg Oral BID  . potassium chloride  40 mEq Oral Daily  . pravastatin  20 mg Oral q1800  .  sodium chloride flush  3 mL Intravenous Q12H  . sodium chloride flush  3 mL Intravenous Q12H  . sodium chloride flush  3 mL Intravenous Q12H  . sodium chloride flush  3 mL Intravenous Q12H  . spironolactone  12.5 mg Oral Daily  . ticagrelor  180 mg Oral Once   Continuous Infusions: . sodium chloride 10 mL/hr at 11/17/16 0555   PRN Meds:.sodium chloride, sodium chloride, sodium chloride, sodium chloride, acetaminophen, ondansetron (ZOFRAN) IV, sodium chloride flush, sodium chloride flush, sodium chloride flush, sodium chloride flush   Vitals:   11/16/16 0901 11/16/16 1315 11/16/16 2058 11/17/16 0548  BP: 92/71 (!) 105/56 101/60 105/69  Pulse: 84 90 85 87  Resp:  18  18  Temp:  98.2 F (36.8 C) 97.9 F (36.6 C) 98 F (36.7 C)  TempSrc:  Oral Oral Oral  SpO2:  97% 97% 97%  Weight:    225 lb 9.6 oz (102.3 kg)  Height:        Intake/Output Summary (Last 24 hours) at 11/17/16 0841 Last data filed at 11/17/16 [id number]  Gross per 24 hour  Intake             1260 ml  Output             1532 ml  Net             -272 ml    LABS: Basic Metabolic Panel:  Recent Labs  11/16/16 0331 11/17/16 0210  NA 139 140  K 3.5 4.3  CL 102 102  CO2 26 29  GLUCOSE 108* 234*  BUN 22* 25*  CREATININE 1.18 1.39*  CALCIUM 8.7* 9.2   Liver Function Tests: No results for input(s): AST, ALT, ALKPHOS, BILITOT, PROT, ALBUMIN in the last 72 hours. No results for input(s): LIPASE, AMYLASE in the last 72 hours. CBC:  Recent Labs  11/16/16 0331 11/17/16 0210  WBC 8.0 6.2  HGB 12.2* 12.7*  HCT 38.2* 39.8  MCV 86.8 87.3  PLT 194 206   Cardiac Enzymes: No results for input(s): CKTOTAL, CKMB, CKMBINDEX, TROPONINI in the last 72 hours. BNP: Invalid input(s): POCBNP D-Dimer: No results for input(s): DDIMER in the last 72 hours. Hemoglobin A1C: No results for input(s): HGBA1C in the last 72 hours. Fasting Lipid Panel: No results for input(s): CHOL, HDL, LDLCALC, TRIG, CHOLHDL, LDLDIRECT in the  last 72 hours. Thyroid Function Tests:  Recent Labs  11/14/16 1614  TSH 0.932   Anemia Panel: No results for input(s): VITAMINB12, FOLATE, FERRITIN, TIBC, IRON, RETICCTPCT in the last 72 hours.  RADIOLOGY: Dg Chest 2 View  Result Date: 11/15/2016 CLINICAL DATA:  Congestive heart failure.  Hemoptysis. EXAM: CHEST  2 VIEW COMPARISON:  November 10, 2016 FINDINGS: Stable cardiomegaly. No overt edema. Mild atelectasis in the left base. No other acute abnormalities. IMPRESSION: No active cardiopulmonary disease. Electronically Signed   By: Dorise Bullion III M.D   On: 11/15/2016 11:16   Dg Chest 2 View  Result Date: 11/10/2016 CLINICAL DATA:  75 year old male with a history of cough and fever EXAM: CHEST  2 VIEW COMPARISON:  11/14/2015 FINDINGS: Cardiomediastinal silhouette unchanged. No pneumothorax.  No pleural effusion. Compared to prior plain film there has been improved aeration at the lung bases. Coarsened interstitial markings, similar to prior. Evidence of native coronary atherosclerosis. No confluent airspace disease. IMPRESSION: Chronic lung changes without evidence of lobar pneumonia. Compared to the prior plain film there is improved aeration. Aortic atherosclerosis.  Native coronary artery disease. Signed, Dulcy Fanny. Earleen Newport, DO [facility] Radiology Electronically Signed   By: Corrie Mckusick D.O.   On: 11/10/2016 12:49   Mr Cardiac Morphology W Wo Contrast  Result Date: 11/16/2016 CLINICAL DATA:  Ischemic cardiomyopathy, assess for viability EXAM: CARDIAC MRI TECHNIQUE: The patient was scanned on a 1.5 Tesla GE magnet. A dedicated cardiac coil was used. Functional imaging was done using Fiesta sequences. 2,3, and 4 chamber views were done to assess for RWMA's. Modified Simpson's rule using a short axis stack was used to calculate an ejection fraction on a dedicated work Conservation officer, nature. The patient received 30 cc of  Multihance. After 10 minutes inversion recovery sequences were used to assess for infiltration and scar tissue. FINDINGS: Limited images of the lung fields showed a trivial left pleural effusion. The left ventricle is severely dilated with severe systolic dysfunction, EF [id number]. The lateral wall is hypokinetic but overall most preserved. The inferior wall, the septum, and the anterior wall appear severely hypokinetic to akinetic. The right ventricle is moderately dilated and moderately dysfunctional. Moderate biatrial enlargement. Trileaflet aortic valve with no stenosis or regurgitation. There is moderate mitral regurgitation, mild tricuspid regurgitation. On delayed enhancement imaging, there was > 50% wall thickness subendocardial late gadolinium enhancement (LGE) in the basal to mid inferior wall. MEASUREMENTS: MEASUREMENTS LVEDV 448 mL LV SV 49 mL LV EF 11% IMPRESSION: 1. Severely dilated left ventricle with EF  11%. Wall motion abnormalities as noted above. 2. Moderately dilated right ventricle with moderate systolic dysfunction. 3.  Moderate mitral regurgitation. 4. >50% thickness subendocardial LGE in the basal to mid inferior wall. These wall segments would be unlikely to show significant improvement with revascularization. Dejah Droessler Electronically Signed   By: Loralie Champagne M.D.   On: 11/16/2016 20:54    PHYSICAL EXAM General: NAD Neck: JVP 10-12 cm, no thyromegaly or thyroid nodule.  Lungs: Clear to auscultation bilaterally with normal respiratory effort. CV: Nondisplaced PMI.  Heart regular S1/S2, no S3/S4, no murmur.  No edema.  Abdomen: Soft, nontender, no hepatosplenomegaly, no distention.  Neurologic: Alert and oriented x 3.  Psych: Normal affect. Extremities: No clubbing or cyanosis.   TELEMETRY: Reviewed telemetry pt in NSR  ASSESSMENT AND PLAN: 75 yo with history of chronic systolic CHF/ischemic cardiomyopathy and CAD was admitted after left/right heart cath yesterday.  1. Acute  on chronic systolic CHF: Ischemic cardiomyopathy.  10/17 echo with EF 15%, diffuse hypokinesis.  NYHA class III symptoms, slowly worsening.  CPX in 10/17 with severe HF limitation.  RHC showed ongoing volume overload with marginal cardiac index, 1.9.  cMRI with EF 11%, has scar only in the basal to mid inferior wall.  Weight down 10 lbs but has persistent volume overload.  BNP remains elevated.  - Hold Lasix today prior to PCI, would resume IV Lasix tomorrow as he will need further diuresis.    - Continue low dose Coreg, would not increase with low output.  - Continue losartan. - Continue spironolactone 12.5 mg daily.  - Digoxin begun.   - LAD territory viable on cMRI, will proceed with PCI to LAD today (high risk).  I suspect RCA could not be revascularized percutaneously (also there is significant scar in basal to mid inferior wall, unlikely to show significant improvement with revascularization), and CABG would be very high risk.  - If he does not have significant improvement with PCI to LAD, he may be nearing the need for LVAD consideration.   - He does not have an ICD.  Has refused in the past but would likely be willing to have one at this point. Would have to wait 3 months post-revascularization of LAD to repeat echo.  Will discuss Lifevest with him. 2. CAD: No CP.  Last PCI 01/2013. Completed his year-long course of ticlopidine. He was allergic to Plavix and had severe gastrocnemius bleed with Effient. See cath above, severe disease with occlusion of RCA throughout the entire long stented segment and severe calcified proximal LAD disease.   - Plan PCI to LAD. Will be relatively high risk given his low EF/low output, will likely need Impella support. I have discussed with Dr. Martinique who will do the case on this afternoon.  Patient understands risks/benefits of PCI and agrees to proceed.  I do not think we could successfully fix the RCA percutaneously (basal to mid inferior wall probably nonviable),  and CABG would be very high risk.  - Continue ASA 81 and pravastatin 20 mg daily. - Ticlopidine is no longer available, will have to use Brilinta with PCI => 180 mg po ordered for on call to cath lab.   3. HLD: Has had myalgias with Crestor and atorvastatin.  He is now on pravastatin 20 mg daily.  LDL was improved, not far from goal. 4. PAD: Occluded peroneal arteries bilaterally.  No significant claudication.  5. Diabetes type II: Unable to get empagliflozin.  6. ID: He has been on  a course of doxycycline planned for 1 week for acute bronchitis => stop after today.   Loralie Champagne 11/17/2016

## 2016-11-18 ENCOUNTER — Encounter (HOSPITAL_COMMUNITY): Payer: Self-pay | Admitting: Cardiology

## 2016-11-18 DIAGNOSIS — T82897A Other specified complication of cardiac prosthetic devices, implants and grafts, initial encounter: Secondary | ICD-10-CM | POA: Diagnosis present

## 2016-11-18 DIAGNOSIS — Z006 Encounter for examination for normal comparison and control in clinical research program: Secondary | ICD-10-CM

## 2016-11-18 DIAGNOSIS — I5023 Acute on chronic systolic (congestive) heart failure: Secondary | ICD-10-CM

## 2016-11-18 LAB — BASIC METABOLIC PANEL
ANION GAP: 7 (ref 5–15)
Anion gap: 4 — ABNORMAL LOW (ref 5–15)
BUN: 19 mg/dL (ref 6–20)
BUN: 17 mg/dL (ref 6–20)
CHLORIDE: 102 mmol/L (ref 101–111)
CHLORIDE: 103 mmol/L (ref 101–111)
CO2: 28 mmol/L (ref 22–32)
CO2: 32 mmol/L (ref 22–32)
Calcium: 9.2 mg/dL (ref 8.9–10.3)
Calcium: 9.4 mg/dL (ref 8.9–10.3)
Creatinine, Ser: 1.05 mg/dL (ref 0.61–1.24)
Creatinine, Ser: 1 mg/dL (ref 0.61–1.24)
GFR calc Af Amer: >60 mL/min (ref >60)
GFR calc non Af Amer: >60 mL/min (ref >60)
GFR calc non Af Amer: >60 mL/min (ref >60)
GLUCOSE: 165 mg/dL — AB (ref 65–99)
GLUCOSE: 117 mg/dL — AB (ref 65–99)
POTASSIUM: 3.9 mmol/L (ref 3.5–5.1)
Potassium: 4.3 mmol/L (ref 3.5–5.1)
Sodium: 137 mmol/L (ref 135–145)
Sodium: 139 mmol/L (ref 135–145)

## 2016-11-18 LAB — CBC
HCT: 40.7 % (ref 39.0–52.0)
HEMOGLOBIN: 13 g/dL (ref 13.0–17.0)
MCH: 27.8 pg (ref 26.0–34.0)
MCHC: 31.9 g/dL (ref 30.0–36.0)
MCV: 87.2 fL (ref 78.0–100.0)
Platelets: 201 10*3/uL (ref 150–400)
RBC: 4.67 MIL/uL (ref 4.22–5.81)
RDW: 14.6 % (ref 11.5–15.5)
WBC: 7.3 10*3/uL (ref 4.0–10.5)

## 2016-11-18 LAB — MRSA PCR SCREENING: MRSA BY PCR: NEGATIVE

## 2016-11-18 LAB — GLUCOSE, CAPILLARY
Glucose-Capillary: 159 mg/dL — ABNORMAL HIGH (ref 65–99)
Glucose-Capillary: 131 mg/dL — ABNORMAL HIGH (ref 65–99)
Glucose-Capillary: 123 mg/dL — ABNORMAL HIGH (ref 65–99)
Glucose-Capillary: 148 mg/dL — ABNORMAL HIGH (ref 65–99)

## 2016-11-18 MED ORDER — SPIRONOLACTONE 25 MG PO TABS: 25.0000 mg | ORAL_TABLET | Freq: Every day | ORAL | Status: DC

## 2016-11-18 MED ORDER — SPIRONOLACTONE 25 MG PO TABS: 12.5000 mg | ORAL_TABLET | Freq: Once | ORAL | Status: DC

## 2016-11-18 MED ORDER — LOSARTAN POTASSIUM 25 MG PO TABS
12.5000 mg | ORAL_TABLET | Freq: Every day | ORAL | Status: DC
  Administered 2016-11-19 – 2016-11-20 (×2): 12.5 mg via ORAL
  Filled 2016-11-18 (×2): qty 1

## 2016-11-18 MED ORDER — SPIRONOLACTONE 25 MG PO TABS
12.5000 mg | ORAL_TABLET | Freq: Every day | ORAL | Status: DC
  Administered 2016-11-19 – 2016-11-20 (×2): 12.5 mg via ORAL
  Filled 2016-11-18 (×2): qty 1

## 2016-11-18 NOTE — Progress Notes (Addendum)
Beat Research Study Visit Week 6. Patient doing well this morning as he is status post PCI yesterday. He states my breathing is better today, I was getting really sluggish and unable to walk any distance without sitting down to rest. I have been taking my medications as Dr. Aundra Dubin ordered and trying to exercise more, but I could not sustain any activity. I spoke to [facility] in Cardiac Rehab and I am going to get started as soon as Dr. Aundra Dubin lets me. Vitals stable today BP 100/64 HR 80  No other concerns today, writer will follow up with patient to give next Research appointment when he discharges.

## 2016-11-18 NOTE — Care Management Important Message (Signed)
Important Message  Patient Details  Name: Wayne [name] MRN: [id number] Date of Birth: November 11, 1941   Medicare Important Message Given:  Yes    Nathen May 11/18/2016, 9:37 AM

## 2016-11-18 NOTE — Progress Notes (Signed)
Patient ID: Wayne [name], male   DOB: Jun 03, 1941, 75 y.o.   MRN: [id number]    SUBJECTIVE: PCI to LAD yesterday with Impella support, successful procedure and Impella out.  Initially doing well this morning but BP dropped later this morning. Now SBP in upper 80s, feels fatigued.   Cardiac MRI (12/1):  1. Severely dilated left ventricle with EF 11%. Wall motion abnormalities as noted above. 2. Moderately dilated right ventricle with moderate systolic dysfunction. 3.  Moderate mitral regurgitation. 4. >50% thickness subendocardial LGE in the basal to mid inferior wall. These wall segments would be unlikely to show significant improvement with revascularization.  Left/right heart cath (12/1):  Left Main  Eccentric calcification with 40-50% ostial stenosis.  Left Anterior Descending  95% calcified proximal LAD stenosis at the take-off of a small D1. 70-80% proximal LAD stenosis following the first lesion, just after the 2nd septal perforator.  Ramus Intermedius  Patent stent. No significant disease.  Left Circumflex  Subtotal occlusion of a small AV LCx. This was seen in the past.  Right Coronary Artery  Long stented segment from ostial RCA to distal RCA. Total occlusion of the RCA starting at the ostium. The PDA and PLV fill via right-right and left-right collaterals.    Right Heart Pressures RHC Procedural Findings: Hemodynamics (mmHg) RA mean 14 RV 49/18 PA 53/27, mean 38 PCWP mean 25 LV 98/25  Oxygen saturations: PA 57% AO 96%  Cardiac Output (Fick) 4.32  Cardiac Index (Fick) 1.93     12/4 PCI with DES to proximal LAD with Impella support.   Scheduled Meds: . aspirin  81 mg Oral Daily  . carvedilol  3.125 mg Oral BID WC  . digoxin  0.125 mg Oral Daily  . furosemide  80 mg Intravenous BID  . glimepiride  2 mg Oral Q breakfast  . insulin aspart  0-15 Units Subcutaneous TID WC  . insulin aspart  0-5 Units Subcutaneous QHS  . [START ON 11/19/2016] losartan  12.5 mg  Oral Daily  . potassium chloride  40 mEq Oral Daily  . pravastatin  20 mg Oral q1800  . sodium chloride flush  3 mL Intravenous Q12H  . sodium chloride flush  3 mL Intravenous Q12H  . sodium chloride flush  3 mL Intravenous Q12H  . [START ON 11/19/2016] spironolactone  12.5 mg Oral Daily  . ticagrelor  90 mg Oral BID   Continuous Infusions:  PRN Meds:.sodium chloride, sodium chloride, sodium chloride, acetaminophen, ondansetron (ZOFRAN) IV, oxyCODONE-acetaminophen, sodium chloride flush, sodium chloride flush, sodium chloride flush   Vitals:   11/18/16 0630 11/18/16 0700 11/18/16 0800 11/18/16 0900  BP: 97/63 (!) 91/49 103/68 93/60  Pulse: 79 80 86 (!) 114  Resp: (!) 26 18 (!) 21 18  Temp:   97.6 F (36.4 C)   TempSrc:   Oral   SpO2: 98% 97% 95% 95%  Weight:      Height:        Intake/Output Summary (Last 24 hours) at 11/18/16 1106 Last data filed at 11/18/16 0944  Gross per 24 hour  Intake              283 ml  Output             1375 ml  Net            -1092 ml    LABS: Basic Metabolic Panel:  Recent Labs  11/17/16 0210 11/18/16 0210  NA 140 137  K 4.3 4.3  CL 102 102  CO2 29 28  GLUCOSE 234* 165*  BUN 25* 19  CREATININE 1.39* 1.05  CALCIUM 9.2 9.2   Liver Function Tests: No results for input(s): AST, ALT, ALKPHOS, BILITOT, PROT, ALBUMIN in the last 72 hours. No results for input(s): LIPASE, AMYLASE in the last 72 hours. CBC:  Recent Labs  11/17/16 0210 11/18/16 0210  WBC 6.2 7.3  HGB 12.7* 13.0  HCT 39.8 40.7  MCV 87.3 87.2  PLT 206 201   Cardiac Enzymes: No results for input(s): CKTOTAL, CKMB, CKMBINDEX, TROPONINI in the last 72 hours. BNP: Invalid input(s): POCBNP D-Dimer: No results for input(s): DDIMER in the last 72 hours. Hemoglobin A1C: No results for input(s): HGBA1C in the last 72 hours. Fasting Lipid Panel: No results for input(s): CHOL, HDL, LDLCALC, TRIG, CHOLHDL, LDLDIRECT in the last 72 hours. Thyroid Function Tests: No  results for input(s): TSH, T4TOTAL, T3FREE, THYROIDAB in the last 72 hours.  Invalid input(s): FREET3 Anemia Panel: No results for input(s): VITAMINB12, FOLATE, FERRITIN, TIBC, IRON, RETICCTPCT in the last 72 hours.  RADIOLOGY: Dg Chest 2 View  Result Date: 11/15/2016 CLINICAL DATA:  Congestive heart failure.  Hemoptysis. EXAM: CHEST  2 VIEW COMPARISON:  November 10, 2016 FINDINGS: Stable cardiomegaly. No overt edema. Mild atelectasis in the left base. No other acute abnormalities. IMPRESSION: No active cardiopulmonary disease. Electronically Signed   By: Dorise Bullion III M.D   On: 11/15/2016 11:16   Dg Chest 2 View  Result Date: 11/10/2016 CLINICAL DATA:  75 year old male with a history of cough and fever EXAM: CHEST  2 VIEW COMPARISON:  11/14/2015 FINDINGS: Cardiomediastinal silhouette unchanged. No pneumothorax.  No pleural effusion. Compared to prior plain film there has been improved aeration at the lung bases. Coarsened interstitial markings, similar to prior. Evidence of native coronary atherosclerosis. No confluent airspace disease. IMPRESSION: Chronic lung changes without evidence of lobar pneumonia. Compared to the prior plain film there is improved aeration. Aortic atherosclerosis.  Native coronary artery disease. Signed, Dulcy Fanny. Earleen Newport, DO [facility] Radiology Electronically Signed   By: Corrie Mckusick D.O.   On: 11/10/2016 12:49   Mr Cardiac Morphology W Wo Contrast  Result Date: 11/16/2016 CLINICAL DATA:  Ischemic cardiomyopathy, assess for viability EXAM: CARDIAC MRI TECHNIQUE: The patient was scanned on a 1.5 Tesla GE magnet. A dedicated cardiac coil was used. Functional imaging was done using Fiesta sequences. 2,3, and 4 chamber views were done to assess for RWMA's. Modified Simpson's rule using a short axis stack was used to calculate an ejection fraction on a dedicated work Conservation officer, nature. The patient received 30  cc of Multihance. After 10 minutes inversion recovery sequences were used to assess for infiltration and scar tissue. FINDINGS: Limited images of the lung fields showed a trivial left pleural effusion. The left ventricle is severely dilated with severe systolic dysfunction, EF [id number]. The lateral wall is hypokinetic but overall most preserved. The inferior wall, the septum, and the anterior wall appear severely hypokinetic to akinetic. The right ventricle is moderately dilated and moderately dysfunctional. Moderate biatrial enlargement. Trileaflet aortic valve with no stenosis or regurgitation. There is moderate mitral regurgitation, mild tricuspid regurgitation. On delayed enhancement imaging, there was > 50% wall thickness subendocardial late gadolinium enhancement (LGE) in the basal to mid inferior wall. MEASUREMENTS: MEASUREMENTS LVEDV 448 mL LV SV 49 mL LV EF 11% IMPRESSION: 1. Severely dilated left ventricle with EF 11%. Wall motion abnormalities as noted above. 2. Moderately dilated  right ventricle with moderate systolic dysfunction. 3.  Moderate mitral regurgitation. 4. >50% thickness subendocardial LGE in the basal to mid inferior wall. These wall segments would be unlikely to show significant improvement with revascularization. Mclane Arora Electronically Signed   By: Loralie Champagne M.D.   On: 11/16/2016 20:54    PHYSICAL EXAM General: NAD Neck: JVP 10-12 cm, no thyromegaly or thyroid nodule.  Lungs: Clear to auscultation bilaterally with normal respiratory effort. CV: Nondisplaced PMI.  Heart regular S1/S2, no S3/S4, no murmur.  No edema.  Abdomen: Soft, nontender, no hepatosplenomegaly, no distention.  Neurologic: Alert and oriented x 3.  Psych: Normal affect. Extremities: No clubbing or cyanosis.  Mild ecchymosis left groin cath site, right benign.   TELEMETRY: Reviewed telemetry pt in NSR  ASSESSMENT AND PLAN: 75 yo with history of chronic systolic CHF/ischemic cardiomyopathy and CAD was  admitted after left/right heart cath yesterday.  1. Acute on chronic systolic CHF: Ischemic cardiomyopathy.  10/17 echo with EF 15%, diffuse hypokinesis.  NYHA class III symptoms, slowly worsening.  CPX in 10/17 with severe HF limitation.  RHC showed ongoing volume overload with marginal cardiac index, 1.9.  cMRI with EF 11%, has scar only in the basal to mid inferior wall.  He has persistent volume overload, IV Lasix restarted today.  BP soft this morning.  - Continue IV Lasix.  - Will hold pm losartan dose with soft BP.  - Continue low dose Coreg, would not increase with low output.  - Continue spironolactone 12.5 mg daily.  - Digoxin begun.   - Now s/p PCI to LAD (viable LAD territory).  I suspect RCA could not be revascularized percutaneously (also there is significant scar in basal to mid inferior wall, unlikely to show significant improvement with revascularization), and CABG would be very high risk.  - If he does not have significant improvement with PCI to LAD, he may be nearing the need for LVAD consideration.   - He does not have an ICD.  Has refused in the past but would likely be willing to have one at this point. Would have to wait 3 months post-revascularization of LAD to repeat echo.  Will discuss Lifevest with him prior to discharge. 2. CAD: No CP.  Last PCI 01/2013. Completed his year-long course of ticlopidine. He was allergic to Plavix and had severe gastrocnemius bleed with Effient. See cath above, severe disease with occlusion of RCA throughout the entire long stented segment and severe calcified proximal LAD disease.  Now s/p PCI to LAD with Impella support.  I do not think we could successfully fix the RCA percutaneously (basal to mid inferior wall probably nonviable), and CABG would be very high risk.  - Continue ASA 81 and pravastatin 20 mg daily. - Ticlopidine is no longer available, he will be on Brilinta 90 mg bid.  - Bilateral groin cath sites benign and hgb is stable. No  back pain.  3. HLD: Has had myalgias with Crestor and atorvastatin.  He is now on pravastatin 20 mg daily.  LDL was improved, not far from goal. 4. PAD: Occluded peroneal arteries bilaterally.  No significant claudication.  5. Diabetes type II: Unable to get empagliflozin.  6. ID: He has been on a course of doxycycline planned for 1 week for acute bronchitis => stop today.   Will keep on 2S today with soft BP.   Loralie Champagne 11/18/2016

## 2016-11-19 ENCOUNTER — Ambulatory Visit (HOSPITAL_COMMUNITY): Payer: Medicare Other

## 2016-11-19 LAB — CBC
HCT: 43 % (ref 39.0–52.0)
HEMOGLOBIN: 13.9 g/dL (ref 13.0–17.0)
MCH: 28.1 pg (ref 26.0–34.0)
MCHC: 32.3 g/dL (ref 30.0–36.0)
MCV: 87 fL (ref 78.0–100.0)
Platelets: 182 10*3/uL (ref 150–400)
RBC: 4.94 MIL/uL (ref 4.22–5.81)
RDW: 14.6 % (ref 11.5–15.5)
WBC: 8.2 10*3/uL (ref 4.0–10.5)

## 2016-11-19 LAB — GLUCOSE, CAPILLARY
GLUCOSE-CAPILLARY: 103 mg/dL — AB (ref 65–99)
GLUCOSE-CAPILLARY: 152 mg/dL — AB (ref 65–99)
Glucose-Capillary: 194 mg/dL — ABNORMAL HIGH (ref 65–99)
Glucose-Capillary: 165 mg/dL — ABNORMAL HIGH (ref 65–99)

## 2016-11-19 LAB — BASIC METABOLIC PANEL
ANION GAP: 11 (ref 5–15)
BUN: 21 mg/dL — ABNORMAL HIGH (ref 6–20)
CALCIUM: 9.3 mg/dL (ref 8.9–10.3)
CO2: 27 mmol/L (ref 22–32)
Chloride: 98 mmol/L — ABNORMAL LOW (ref 101–111)
Creatinine, Ser: 1.14 mg/dL (ref 0.61–1.24)
GLUCOSE: 109 mg/dL — AB (ref 65–99)
POTASSIUM: 4.3 mmol/L (ref 3.5–5.1)
SODIUM: 136 mmol/L (ref 135–145)

## 2016-11-19 MED ORDER — FUROSEMIDE 80 MG PO TABS
80.0000 mg | ORAL_TABLET | Freq: Two times a day (BID) | ORAL | Status: DC
  Administered 2016-11-19 – 2016-11-20 (×2): 80 mg via ORAL
  Filled 2016-11-19 (×2): qty 1

## 2016-11-19 MED ORDER — ENOXAPARIN SODIUM 40 MG/0.4ML ~~LOC~~ SOLN
40.0000 mg | Freq: Every day | SUBCUTANEOUS | Status: DC
  Administered 2016-11-19 – 2016-11-20 (×2): 40 mg via SUBCUTANEOUS
  Filled 2016-11-19 (×2): qty 0.4

## 2016-11-19 NOTE — Consult Note (Signed)
   [facility] Inpatient Consult   11/19/2016  Wayne [name] [birth year] [id number]   [facility] Care Management follow up on referral from [facility] PCP Navigator. Chart reviewed. [name] is in ICU. Will follow up at later time.    Marthenia Rolling, MSN-Ed, RN,BSN [facility] Liaison [phone]

## 2016-11-19 NOTE — Progress Notes (Signed)
CARDIAC REHAB PHASE I   PRE:  Rate/Rhythm: 92 SR    BP: sitting 98/78    SaO2: 95 2L, 93 RA  MODE:  Ambulation: 300 ft   POST:  Rate/Rhythm: 110 ST    BP: sitting 123/84     SaO2: 95 RA  Pt able to walk independently. Feels good, VSS. Ed completed with good comprehension. Will refer to [facility] again. Understands importance of Brilinta. Still needs card. Gave HF booklet and discussed. [id number]   [place], ACSM 11/19/2016 12:19 PM

## 2016-11-19 NOTE — Progress Notes (Signed)
Notified Cardiology pts BP dropped into the 80s and pt is not symptomatic. Per Cardiology PA will hold and discontinue Coreg for now. Will continue to monitor closely.

## 2016-11-19 NOTE — Progress Notes (Signed)
Patient ID: Wayne [name], male   DOB: May 23, 1941, 75 y.o.   MRN: [id number]    Subjective: PCI to LAD 11/17/16 with Impella support, successful procedure and Impella out.    Pressures more stable today, SBP 90s-100s.   No SOB or CP.  Would like to go home.  He states he is still very hesitant about ICD, but would like to talk to Dr. Aundra Dubin more about LifeVest.  Out 2.2 L, suspect weight yesterday inaccurate.   Urine remains very clear.   Cardiac MRI (12/1):  1. Severely dilated left ventricle with EF 11%. Wall motion abnormalities as noted above. 2. Moderately dilated right ventricle with moderate systolic dysfunction. 3.  Moderate mitral regurgitation. 4. >50% thickness subendocardial LGE in the basal to mid inferior wall. These wall segments would be unlikely to show significant improvement with revascularization.  Left/right heart cath (12/1):  Left Main  Eccentric calcification with 40-50% ostial stenosis.  Left Anterior Descending  95% calcified proximal LAD stenosis at the take-off of a small D1. 70-80% proximal LAD stenosis following the first lesion, just after the 2nd septal perforator.  Ramus Intermedius  Patent stent. No significant disease.  Left Circumflex  Subtotal occlusion of a small AV LCx. This was seen in the past.  Right Coronary Artery  Long stented segment from ostial RCA to distal RCA. Total occlusion of the RCA starting at the ostium. The PDA and PLV fill via right-right and left-right collaterals.    Right Heart Pressures RHC Procedural Findings: Hemodynamics (mmHg) RA mean 14 RV 49/18 PA 53/27, mean 38 PCWP mean 25 LV 98/25  Oxygen saturations: PA 57% AO 96%  Cardiac Output (Fick) 4.32  Cardiac Index (Fick) 1.93     12/4 PCI with DES to proximal LAD with Impella support.   Scheduled Meds: . aspirin  81 mg Oral Daily  . carvedilol  3.125 mg Oral BID WC  . digoxin  0.125 mg Oral Daily  . furosemide  80 mg Intravenous BID  . glimepiride   2 mg Oral Q breakfast  . insulin aspart  0-15 Units Subcutaneous TID WC  . insulin aspart  0-5 Units Subcutaneous QHS  . losartan  12.5 mg Oral Daily  . potassium chloride  40 mEq Oral Daily  . pravastatin  20 mg Oral q1800  . sodium chloride flush  3 mL Intravenous Q12H  . sodium chloride flush  3 mL Intravenous Q12H  . sodium chloride flush  3 mL Intravenous Q12H  . spironolactone  12.5 mg Oral Daily  . ticagrelor  90 mg Oral BID   Continuous Infusions:  PRN Meds:.sodium chloride, sodium chloride, sodium chloride, acetaminophen, ondansetron (ZOFRAN) IV, oxyCODONE-acetaminophen, sodium chloride flush, sodium chloride flush, sodium chloride flush   Vitals:   11/19/16 0800 11/19/16 0825 11/19/16 0900 11/19/16 1000  BP: 98/75  102/73 (!) 120/104  Pulse: 96  93 98  Resp: (!) 21  20 (!) 21  Temp:  98.3 F (36.8 C)    TempSrc:  Oral    SpO2: 91%  98% 97%  Weight:      Height:        Intake/Output Summary (Last 24 hours) at 11/19/16 1035 Last data filed at 11/19/16 1000  Gross per 24 hour  Intake              943 ml  Output             2375 ml  Net            -  1432 ml    LABS: Basic Metabolic Panel:  Recent Labs  11/18/16 1208 11/19/16 0202  NA 139 136  K 3.9 4.3  CL 103 98*  CO2 32 27  GLUCOSE 117* 109*  BUN 17 21*  CREATININE 1.00 1.14  CALCIUM 9.4 9.3   Liver Function Tests: No results for input(s): AST, ALT, ALKPHOS, BILITOT, PROT, ALBUMIN in the last 72 hours. No results for input(s): LIPASE, AMYLASE in the last 72 hours. CBC:  Recent Labs  11/18/16 0210 11/19/16 0202  WBC 7.3 8.2  HGB 13.0 13.9  HCT 40.7 43.0  MCV 87.2 87.0  PLT 201 182   Cardiac Enzymes: No results for input(s): CKTOTAL, CKMB, CKMBINDEX, TROPONINI in the last 72 hours. BNP: Invalid input(s): POCBNP D-Dimer: No results for input(s): DDIMER in the last 72 hours. Hemoglobin A1C: No results for input(s): HGBA1C in the last 72 hours. Fasting Lipid Panel: No results for  input(s): CHOL, HDL, LDLCALC, TRIG, CHOLHDL, LDLDIRECT in the last 72 hours. Thyroid Function Tests: No results for input(s): TSH, T4TOTAL, T3FREE, THYROIDAB in the last 72 hours.  Invalid input(s): FREET3 Anemia Panel: No results for input(s): VITAMINB12, FOLATE, FERRITIN, TIBC, IRON, RETICCTPCT in the last 72 hours.  RADIOLOGY: Dg Chest 2 View  Result Date: 11/15/2016 CLINICAL DATA:  Congestive heart failure.  Hemoptysis. EXAM: CHEST  2 VIEW COMPARISON:  November 10, 2016 FINDINGS: Stable cardiomegaly. No overt edema. Mild atelectasis in the left base. No other acute abnormalities. IMPRESSION: No active cardiopulmonary disease. Electronically Signed   By: Dorise Bullion III M.D   On: 11/15/2016 11:16   Dg Chest 2 View  Result Date: 11/10/2016 CLINICAL DATA:  75 year old male with a history of cough and fever EXAM: CHEST  2 VIEW COMPARISON:  11/14/2015 FINDINGS: Cardiomediastinal silhouette unchanged. No pneumothorax.  No pleural effusion. Compared to prior plain film there has been improved aeration at the lung bases. Coarsened interstitial markings, similar to prior. Evidence of native coronary atherosclerosis. No confluent airspace disease. IMPRESSION: Chronic lung changes without evidence of lobar pneumonia. Compared to the prior plain film there is improved aeration. Aortic atherosclerosis.  Native coronary artery disease. Signed, Dulcy Fanny. Earleen Newport, DO [facility] Electronically Signed   By: Corrie Mckusick D.O.   On: 11/10/2016 12:49   Mr Cardiac Morphology W Wo Contrast  Result Date: 11/16/2016 CLINICAL DATA:  Ischemic cardiomyopathy, assess for viability EXAM: CARDIAC MRI TECHNIQUE: The patient was scanned on a 1.5 Tesla GE magnet. A dedicated cardiac coil was used. Functional imaging was done using Fiesta sequences. 2,3, and 4 chamber views were done to assess for RWMA's. Modified Simpson's rule using a short axis stack was used to  calculate an ejection fraction on a dedicated work Conservation officer, nature. The patient received 30 cc of Multihance. After 10 minutes inversion recovery sequences were used to assess for infiltration and scar tissue. FINDINGS: Limited images of the lung fields showed a trivial left pleural effusion. The left ventricle is severely dilated with severe systolic dysfunction, EF [id number]. The lateral wall is hypokinetic but overall most preserved. The inferior wall, the septum, and the anterior wall appear severely hypokinetic to akinetic. The right ventricle is moderately dilated and moderately dysfunctional. Moderate biatrial enlargement. Trileaflet aortic valve with no stenosis or regurgitation. There is moderate mitral regurgitation, mild tricuspid regurgitation. On delayed enhancement imaging, there was > 50% wall thickness subendocardial late gadolinium enhancement (LGE) in the basal to mid inferior wall. MEASUREMENTS: MEASUREMENTS LVEDV 448  mL LV SV 49 mL LV EF 11% IMPRESSION: 1. Severely dilated left ventricle with EF 11%. Wall motion abnormalities as noted above. 2. Moderately dilated right ventricle with moderate systolic dysfunction. 3.  Moderate mitral regurgitation. 4. >50% thickness subendocardial LGE in the basal to mid inferior wall. These wall segments would be unlikely to show significant improvement with revascularization. Kwasi Joung Electronically Signed   By: Loralie Champagne M.D.   On: 11/16/2016 20:54    PHYSICAL EXAM General: NAD Neck: JVP 7-8. No thyromegaly or thyroid nodule.  Lungs: CTAB, normal effort CV: Nondisplaced PMI.  Heart regular S1/S2, no S3/S4, no murmur.  No edema.  Abdomen: Soft, NT, ND, no HSM. No bruits or masses. +BS   Neurologic: Alert and oriented x 3.  Psych: Normal affect. Extremities: No clubbing or cyanosis.  Mild ecchymosis left groin cath site, right benign.   TELEMETRY: Reviewed, NSR in 90s today.  Short NSVT runs x 2.   ASSESSMENT AND PLAN: 75 yo  with history of chronic systolic CHF/ischemic cardiomyopathy and CAD was admitted after left/right heart cath yesterday.  1. Acute on chronic systolic CHF: Ischemic cardiomyopathy.  10/17 echo with EF 15%, diffuse hypokinesis.  NYHA class III symptoms, slowly worsening.  CPX in 10/17 with severe HF limitation.  RHC showed ongoing volume overload with marginal cardiac index, 1.9.  cMRI with EF 11%, has scar only in the basal to mid inferior wall.  Good diuresis, looks near-euvolemic now.   - Got morning dose of IV lasix.  Transition to po lasix 80 mg BID this evening. Possibly home.  - Give losartan this am and follow pressures. - Continue low dose Coreg, would not increase with low output.  - Continue spironolactone 12.5 mg daily.  - Continue dixogin 0.125 mg daily.    - Now s/p PCI to LAD (viable LAD territory). Suspect RCA could not be revascularized percutaneously and CABG would be very high risk.  - If he does not have significant improvement with PCI to LAD, LVAD will be an option.  - He does not have an ICD.  He states he may consider, but remains hesitant. Would have to wait 3 months post-revascularization of LAD to repeat echo.  - He is considering Lifevest, but would like to speak further with MD about.  2. CAD: No CP.  Last PCI 01/2013. Completed his year-long course of ticlopidine. He was allergic to Plavix and had severe gastrocnemius bleed with Effient. See cath above, severe disease with occlusion of RCA throughout the entire long stented segment and severe calcified proximal LAD disease.  Now s/p PCI to LAD with Impella support.  - Continue ASA 81 and pravastatin 20 mg daily. - Ticlopidine is no longer available, he will be on Brilinta 90 mg bid.  - Bilateral groin cath sites benign and hgb is stable. No back pain.  3. HLD: Has had myalgias with Crestor and atorvastatin.  He is now on pravastatin 20 mg daily.  LDL was improved, not far from goal. 4. PAD: Occluded peroneal arteries  bilaterally.  No significant claudication.  5. Diabetes type II: Unable to get empagliflozin.  6. ID:  - Completed course of doxycycline planned for 1 week for acute bronchitis.  Likely home in next 24-48 hours. Transition to po lasix this evening. Pt is considering lifevest.  Shirley Friar, PA-C 11/19/2016  Advanced Heart Failure Team Pager [phone] (M-F; 7a - 4p)  Please contact [facility] for night-coverage after hours (4p -7a ) and weekends  on [email or web address]  Patient seen with PA, agree with the above.  Volume improved, transition to po Lasix today.  Continue current meds, will not titrate with soft BP.    Short NSVT runs, will send out on Lifevest (we discussed this, he agrees).  Repeat echo in 3 months to determine ICD.   If he does not have significant improvement with PCI from this admission, will likely end up needing LVAD.   Loralie Champagne 11/19/2016 12:47 PM

## 2016-11-20 LAB — BASIC METABOLIC PANEL
Anion gap: 10 (ref 5–15)
BUN: 25 mg/dL — ABNORMAL HIGH (ref 6–20)
CALCIUM: 9.4 mg/dL (ref 8.9–10.3)
CO2: 27 mmol/L (ref 22–32)
CREATININE: 1.18 mg/dL (ref 0.61–1.24)
Chloride: 99 mmol/L — ABNORMAL LOW (ref 101–111)
GFR calc non Af Amer: 59 mL/min — ABNORMAL LOW (ref >60)
Glucose, Bld: 104 mg/dL — ABNORMAL HIGH (ref 65–99)
Potassium: 4.2 mmol/L (ref 3.5–5.1)
SODIUM: 136 mmol/L (ref 135–145)

## 2016-11-20 LAB — DIGOXIN LEVEL: DIGOXIN LVL: 0.3 ng/mL — AB (ref 0.8–2.0)

## 2016-11-20 LAB — MAGNESIUM: MAGNESIUM: 2.2 mg/dL (ref 1.7–2.4)

## 2016-11-20 LAB — GLUCOSE, CAPILLARY
Glucose-Capillary: 160 mg/dL — ABNORMAL HIGH (ref 65–99)
Glucose-Capillary: 208 mg/dL — ABNORMAL HIGH (ref 65–99)

## 2016-11-20 MED ORDER — POTASSIUM CHLORIDE CRYS ER 20 MEQ PO TBCR: 40.0000 meq | EXTENDED_RELEASE_TABLET | Freq: Every day | ORAL | 6 refills | Status: DC

## 2016-11-20 MED ORDER — LOSARTAN POTASSIUM 25 MG PO TABS: 12.5000 mg | ORAL_TABLET | Freq: Every day | ORAL | 6 refills | Status: DC

## 2016-11-20 MED ORDER — DIGOXIN 125 MCG PO TABS: 0.1250 mg | ORAL_TABLET | Freq: Every day | ORAL | 6 refills | Status: DC

## 2016-11-20 MED ORDER — CARVEDILOL 3.125 MG PO TABS
3.1250 mg | ORAL_TABLET | Freq: Two times a day (BID) | ORAL | Status: DC
  Administered 2016-11-20: 3.125 mg via ORAL
  Filled 2016-11-20: qty 1

## 2016-11-20 MED ORDER — TICAGRELOR 90 MG PO TABS: 90.0000 mg | ORAL_TABLET | Freq: Two times a day (BID) | ORAL | 6 refills | Status: DC

## 2016-11-20 MED ORDER — SPIRONOLACTONE 25 MG PO TABS: 12.5000 mg | ORAL_TABLET | Freq: Every day | ORAL | 6 refills | Status: DC

## 2016-11-20 NOTE — Progress Notes (Signed)
Patient ID: Wayne [name], male   DOB: 1941-02-13, 75 y.o.   MRN: [id number]    Subjective: PCI to LAD 11/17/16 with Impella support, successful procedure and Impella out.    Pressures stable, SBP 90s-100s.  No dyspnea, weight down 15 lbs total.  Creatinine stable.    Cardiac MRI (12/1):  1. Severely dilated left ventricle with EF 11%. Wall motion abnormalities as noted above. 2. Moderately dilated right ventricle with moderate systolic dysfunction. 3.  Moderate mitral regurgitation. 4. >50% thickness subendocardial LGE in the basal to mid inferior wall. These wall segments would be unlikely to show significant improvement with revascularization.  Left/right heart cath (12/1):  Left Main  Eccentric calcification with 40-50% ostial stenosis.  Left Anterior Descending  95% calcified proximal LAD stenosis at the take-off of a small D1. 70-80% proximal LAD stenosis following the first lesion, just after the 2nd septal perforator.  Ramus Intermedius  Patent stent. No significant disease.  Left Circumflex  Subtotal occlusion of a small AV LCx. This was seen in the past.  Right Coronary Artery  Long stented segment from ostial RCA to distal RCA. Total occlusion of the RCA starting at the ostium. The PDA and PLV fill via right-right and left-right collaterals.    Right Heart Pressures RHC Procedural Findings: Hemodynamics (mmHg) RA mean 14 RV 49/18 PA 53/27, mean 38 PCWP mean 25 LV 98/25  Oxygen saturations: PA 57% AO 96%  Cardiac Output (Fick) 4.32  Cardiac Index (Fick) 1.93     12/4 PCI with DES to proximal LAD with Impella support.   Scheduled Meds: . aspirin  81 mg Oral Daily  . carvedilol  3.125 mg Oral BID WC  . digoxin  0.125 mg Oral Daily  . enoxaparin (LOVENOX) injection  40 mg Subcutaneous Daily  . furosemide  80 mg Oral BID  . glimepiride  2 mg Oral Q breakfast  . insulin aspart  0-15 Units Subcutaneous TID WC  . insulin aspart  0-5 Units Subcutaneous QHS  .  losartan  12.5 mg Oral Daily  . potassium chloride  40 mEq Oral Daily  . pravastatin  20 mg Oral q1800  . sodium chloride flush  3 mL Intravenous Q12H  . sodium chloride flush  3 mL Intravenous Q12H  . sodium chloride flush  3 mL Intravenous Q12H  . spironolactone  12.5 mg Oral Daily  . ticagrelor  90 mg Oral BID   Continuous Infusions:  PRN Meds:.sodium chloride, sodium chloride, sodium chloride, acetaminophen, ondansetron (ZOFRAN) IV, oxyCODONE-acetaminophen, sodium chloride flush, sodium chloride flush, sodium chloride flush   Vitals:   11/20/16 0500 11/20/16 0600 11/20/16 0700 11/20/16 0800  BP: (!) 90/50 104/73 110/72 99/79  Pulse: 81 92 91 88  Resp: 17 16 (!) 21 (!) 24  Temp:    98.1 F (36.7 C)  TempSrc:    Oral  SpO2: 95% 95% 94% 96%  Weight: 220 lb 0.3 oz (99.8 kg)     Height:        Intake/Output Summary (Last 24 hours) at 11/20/16 0928 Last data filed at 11/20/16 0921  Gross per 24 hour  Intake              846 ml  Output             2100 ml  Net            -1254 ml    LABS: Basic Metabolic Panel:  Recent Labs  11/19/16 0202 11/20/16 0300  NA 136 136  K 4.3 4.2  CL 98* 99*  CO2 27 27  GLUCOSE 109* 104*  BUN 21* 25*  CREATININE 1.14 1.18  CALCIUM 9.3 9.4  MG  --  2.2   Liver Function Tests: No results for input(s): AST, ALT, ALKPHOS, BILITOT, PROT, ALBUMIN in the last 72 hours. No results for input(s): LIPASE, AMYLASE in the last 72 hours. CBC:  Recent Labs  11/18/16 0210 11/19/16 0202  WBC 7.3 8.2  HGB 13.0 13.9  HCT 40.7 43.0  MCV 87.2 87.0  PLT 201 182   Cardiac Enzymes: No results for input(s): CKTOTAL, CKMB, CKMBINDEX, TROPONINI in the last 72 hours. BNP: Invalid input(s): POCBNP D-Dimer: No results for input(s): DDIMER in the last 72 hours. Hemoglobin A1C: No results for input(s): HGBA1C in the last 72 hours. Fasting Lipid Panel: No results for input(s): CHOL, HDL, LDLCALC, TRIG, CHOLHDL, LDLDIRECT in the last 72  hours. Thyroid Function Tests: No results for input(s): TSH, T4TOTAL, T3FREE, THYROIDAB in the last 72 hours.  Invalid input(s): FREET3 Anemia Panel: No results for input(s): VITAMINB12, FOLATE, FERRITIN, TIBC, IRON, RETICCTPCT in the last 72 hours.  RADIOLOGY: Dg Chest 2 View  Result Date: 11/15/2016 CLINICAL DATA:  Congestive heart failure.  Hemoptysis. EXAM: CHEST  2 VIEW COMPARISON:  November 10, 2016 FINDINGS: Stable cardiomegaly. No overt edema. Mild atelectasis in the left base. No other acute abnormalities. IMPRESSION: No active cardiopulmonary disease. Electronically Signed   By: Dorise Bullion III M.D   On: 11/15/2016 11:16   Dg Chest 2 View  Result Date: 11/10/2016 CLINICAL DATA:  75 year old male with a history of cough and fever EXAM: CHEST  2 VIEW COMPARISON:  11/14/2015 FINDINGS: Cardiomediastinal silhouette unchanged. No pneumothorax.  No pleural effusion. Compared to prior plain film there has been improved aeration at the lung bases. Coarsened interstitial markings, similar to prior. Evidence of native coronary atherosclerosis. No confluent airspace disease. IMPRESSION: Chronic lung changes without evidence of lobar pneumonia. Compared to the prior plain film there is improved aeration. Aortic atherosclerosis.  Native coronary artery disease. Signed, Dulcy Fanny. Earleen Newport, DO [facility] Electronically Signed   By: Corrie Mckusick D.O.   On: 11/10/2016 12:49   Mr Cardiac Morphology W Wo Contrast  Result Date: 11/16/2016 CLINICAL DATA:  Ischemic cardiomyopathy, assess for viability EXAM: CARDIAC MRI TECHNIQUE: The patient was scanned on a 1.5 Tesla GE magnet. A dedicated cardiac coil was used. Functional imaging was done using Fiesta sequences. 2,3, and 4 chamber views were done to assess for RWMA's. Modified Simpson's rule using a short axis stack was used to calculate an ejection fraction on a dedicated work Brewing technologist. The patient received 30 cc of Multihance. After 10 minutes inversion recovery sequences were used to assess for infiltration and scar tissue. FINDINGS: Limited images of the lung fields showed a trivial left pleural effusion. The left ventricle is severely dilated with severe systolic dysfunction, EF [id number]. The lateral wall is hypokinetic but overall most preserved. The inferior wall, the septum, and the anterior wall appear severely hypokinetic to akinetic. The right ventricle is moderately dilated and moderately dysfunctional. Moderate biatrial enlargement. Trileaflet aortic valve with no stenosis or regurgitation. There is moderate mitral regurgitation, mild tricuspid regurgitation. On delayed enhancement imaging, there was > 50% wall thickness subendocardial late gadolinium enhancement (LGE) in the basal to mid inferior wall. MEASUREMENTS: MEASUREMENTS LVEDV 448 mL LV SV 49 mL LV EF 11% IMPRESSION: 1. Severely dilated  left ventricle with EF 11%. Wall motion abnormalities as noted above. 2. Moderately dilated right ventricle with moderate systolic dysfunction. 3.  Moderate mitral regurgitation. 4. >50% thickness subendocardial LGE in the basal to mid inferior wall. These wall segments would be unlikely to show significant improvement with revascularization. Rosendo Couser Electronically Signed   By: Loralie Champagne M.D.   On: 11/16/2016 20:54    PHYSICAL EXAM General: NAD Neck: JVP 7. No thyromegaly or thyroid nodule.  Lungs: CTAB, normal effort CV: Nondisplaced PMI.  Heart regular S1/S2, no S3/S4, no murmur.  No edema.  Abdomen: Soft, NT, ND, no HSM. No bruits or masses. +BS   Neurologic: Alert and oriented x 3.  Psych: Normal affect. Extremities: No clubbing or cyanosis.  Mild ecchymosis left groin cath site, right benign.   TELEMETRY: Reviewed, NSR in 90s today.     ASSESSMENT AND PLAN: 75 yo with history of chronic systolic CHF/ischemic cardiomyopathy and CAD was admitted after  left/right heart cath yesterday.  1. Acute on chronic systolic CHF: Ischemic cardiomyopathy.  10/17 echo with EF 15%, diffuse hypokinesis.  NYHA class III symptoms, slowly worsening.  CPX in 10/17 with severe HF limitation.  RHC showed ongoing volume overload with marginal cardiac index, 1.9.  cMRI with EF 11%, has scar only in the basal to mid inferior wall.  Good diuresis, looks near-euvolemic now.   - Continue Lasix 80 mg po bid for home.   - Continue losartan 12.5 mg daily. - Continue low dose Coreg, would not increase with low output.  - Continue spironolactone 12.5 mg daily.  - Continue dixogin 0.125 mg daily, level ok today.    - Now s/p PCI to LAD (viable LAD territory). Suspect RCA could not be revascularized percutaneously and CABG would be very high risk.  - If he does not have significant improvement with PCI to LAD, LVAD will be an option.  - He does not have an ICD.  He states he may consider, but remains hesitant. Would have to wait 3 months post-revascularization of LAD to repeat echo.  - Will arrange for Lifevest at discharge.   2. CAD: No CP.  Last PCI 01/2013. Completed his year-long course of ticlopidine. He was allergic to Plavix and had severe gastrocnemius bleed with Effient. See cath above, severe disease with occlusion of RCA throughout the entire long stented segment and severe calcified proximal LAD disease.  Now s/p PCI to LAD with Impella support.  - Continue ASA 81 and pravastatin 20 mg daily. - Ticlopidine is no longer available, he will be on Brilinta 90 mg bid.  - Bilateral groin cath sites benign and hgb is stable. No back pain.  3. HLD: Has had myalgias with Crestor and atorvastatin.  He is now on pravastatin 20 mg daily.  LDL was improved, not far from goal. 4. PAD: Occluded peroneal arteries bilaterally.  No significant claudication.  5. Diabetes type II: Unable to get empagliflozin.  6. ID: Completed course of doxycycline planned for 1 week for acute  bronchitis. 7. NSVT: Will go home with Lifevest.  8. Disposition: Home today.  Needs followup in clinic in 2 wks.  Needs Lifevest until followup echo in 3 months post-revascularization.  Please make sure he understands changes in his med regimen.  Meds for home: ticagrelor 90 bid, ASA 81, pravastatin 20, Coreg 3.125 mg bid, losartan 12.5 daily, spironolactone 12.5 daily, digoxin 0.125 daily, home diabetes meds as prior to admission.   Loralie Champagne 11/20/2016 9:28 AM

## 2016-11-20 NOTE — Progress Notes (Signed)
CARDIAC REHAB PHASE I   PRE:  Rate/Rhythm: 93 SR    BP: sitting 94/73    SaO2: 97 RA  MODE:  Ambulation: 700 ft   POST:  Rate/Rhythm: 117 ST    BP: sitting 142/91     SaO2: 96 RA  Tolerated well. HR up with distance, BP up as well. Only c/o was calf pain with distance. Discussed lifevest and set up video.  [id number]   [place], ACSM 11/20/2016 11:55 AM

## 2016-11-20 NOTE — Discharge Summary (Signed)
Advanced Heart Failure Discharge Note  Discharge Summary   Patient ID: Wayne [name] MRN: [id number], DOB/AGE: January 02, 1941 75 y.o. Admit date: 11/14/2016 D/C date:     11/20/2016   Primary Discharge Diagnoses:  1. Acute on chronic systolic CHF with ICM. Echo 10/17 with EF 15% 2. CAD s/p PCI to LAD 11/17/16 3. HLD 4. PAD 5. Diabetes type 2 6. Acute bronchitis 7. NSVT   Wayne [name] is a 75 y.o. male with history of systolic CHF, CAD, HLD, PAD, and DM2 admitted from cath lab 11/14/16 when cath showed elevated filling pressures, Long occlusion of the RCA within previous stent, Known occlusion of small AV LCx, 95% and 70-80% serial proximal LAD stenoses(calcified), and 40-50% eccentric calcified stenosis ostial left main. Discussed with interventionalist.   cMRI 11/14/16 showed EF 11%, Moderately dilated RV with moderate systolic dysfunction, Moderate MR, and > 50% thickness subendocardial LGE in the basal to mid-inferior wall. Unlikely to show significant improvement with revascularization. Notably, LAD territory thought to be viable.  Pt diuresed as tolerated and started on digoxin on admission.  Pt also treated for acute bronchitis with one week course of doxycycline.  Pt underwent PCI to LAD 11/17/16. Mechanical support was given using an Impella device during the procedure with patients markedly low EF.  Impella successfully removed without complication.  Pt diuresed 4.4 L and down 15 lbs from his admission weight and transitioned back to po diuretics.   Additionally, patient completed course of doxycycline for Acute bronchitis.  With NSVT, markedly depressed LV EF, and consideration for ICD, Lifevest was placed prior to discharge.   Pt improved symptomatically and will be stable for discharged to home in stable condition with close follow up as below.    Discharge Weight Range: 220 lbs Discharge Vitals: Blood pressure 106/74, pulse (!) 102, temperature 97.7 F (36.5 C), resp. rate 18,  height 5\' 10"  (1.778 m), weight 220 lb 0.3 oz (99.8 kg), SpO2 95 %.  Labs: Lab Results  Component Value Date   WBC 8.2 11/19/2016   HGB 13.9 11/19/2016   HCT 43.0 11/19/2016   MCV 87.0 11/19/2016   PLT 182 11/19/2016    Recent Labs Lab 11/20/16 0300  NA 136  K 4.2  CL 99*  CO2 27  BUN 25*  CREATININE 1.18  CALCIUM 9.4  GLUCOSE 104*   Lab Results  Component Value Date   CHOL 146 10/06/2016   HDL 32 (L) 10/06/2016   LDLCALC 87 10/06/2016   TRIG 135 10/06/2016   BNP (last 3 results)  Recent Labs  11/10/16 1132 11/14/16 1405 11/17/16 0210  BNP 2,394.6* 1,992.9* 2,032.0*    ProBNP (last 3 results) No results for input(s): PROBNP in the last 8760 hours.   Diagnostic Studies/Procedures   As discussed above. Please see Chart for more details.  Discharge Medications     Medication List    STOP taking these medications   doxycycline 100 MG capsule Commonly known as:  VIBRAMYCIN     TAKE these medications   accu-chek soft touch lancets Check blood sugars 1-2 times per day. DX: E11.65   acetaminophen 500 MG tablet Commonly known as:  TYLENOL Take 500 mg by mouth daily as needed for moderate pain or headache.   aspirin EC 81 MG tablet Take 81 mg by mouth daily.   carvedilol 3.125 MG tablet Commonly known as:  COREG Take 1 tablet (3.125 mg total) by mouth 2 (two) times daily.   CENTRUM SILVER ADULT 50+ Tabs  Take 1 tablet by mouth daily.   digoxin 0.125 MG tablet Commonly known as:  LANOXIN Take 1 tablet (0.125 mg total) by mouth daily. Start taking on:  11/21/2016   diphenhydramine-acetaminophen 25-500 MG Tabs tablet Commonly known as:  TYLENOL PM Take 1 tablet by mouth at bedtime as needed (sleep).   furosemide 80 MG tablet Commonly known as:  LASIX Take 1 tablet (80 mg total) by mouth 2 (two) times daily.   glimepiride 2 MG tablet Commonly known as:  AMARYL Take 1 tablet (2 mg total) by mouth daily before breakfast.   glucose blood test  strip Commonly known as:  ACCU-CHEK AVIVA Check blood sugars 1-2 times per day. DX: E11.65   losartan 25 MG tablet Commonly known as:  COZAAR Take 0.5 tablets (12.5 mg total) by mouth daily. Start taking on:  11/21/2016 What changed:  when to take this  Another medication with the same name was removed. Continue taking this medication, and follow the directions you see here.   potassium chloride SA 20 MEQ tablet Commonly known as:  K-DUR,KLOR-CON Take 2 tablets (40 mEq total) by mouth daily. Start taking on:  11/21/2016   pravastatin 20 MG tablet Commonly known as:  PRAVACHOL Take 1 tablet (20 mg total) by mouth every evening.   spironolactone 25 MG tablet Commonly known as:  ALDACTONE Take 0.5 tablets (12.5 mg total) by mouth daily. Start taking on:  11/21/2016   ticagrelor 90 MG Tabs tablet Commonly known as:  BRILINTA Take 1 tablet (90 mg total) by mouth 2 (two) times daily.            Durable Medical Equipment        Start     Ordered   11/19/16 1223  For home use only DME Vest life vest  Once     11/19/16 1223      Disposition   The patient will be discharged in stable condition to home. Discharge Instructions    (HEART FAILURE PATIENTS) Call MD:  Anytime you have any of the following symptoms: 1) 3 pound weight gain in 24 hours or 5 pounds in 1 week 2) shortness of breath, with or without a dry hacking cough 3) swelling in the hands, feet or stomach 4) if you have to sleep on extra pillows at night in order to breathe.    Complete by:  As directed    Amb Referral to Cardiac Rehabilitation    Complete by:  As directed    Diagnosis:   PTCA Coronary Stents     Diet - low sodium heart healthy    Complete by:  As directed    Heart Failure patients record your daily weight using the same scale at the same time of day    Complete by:  As directed    Increase activity slowly    Complete by:  As directed      Follow-up Information    [facility] Follow up on 12/03/2016.   Specialty:  Cardiology Why:  at 1000 am for post hospital follow up. Please bring all of your medications with you to your visit. The code for parking is 0040. Contact information: [address] [id number] [place] [phone]            Duration of Discharge Encounter: Greater than 35 minutes   Signed, Annamaria Helling 11/20/2016, 2:21 PM

## 2016-11-20 NOTE — Consult Note (Signed)
   [facility] Inpatient Consult   11/20/2016  MATE RODEFFER 1941/12/01 [id number]     [facility] Care Management follow up. Chart reviewed. Mr. Domonkos discharged prior to bedside visit.     Marthenia Rolling, MSN-Ed, RN,BSN [facility] Liaison [phone]

## 2016-11-20 NOTE — Progress Notes (Signed)
Assessment unchanged. Discharged D/C instructions with pt including RX given to pt. IV and tele removed. Pt left with belongings, attached to LifeVest, and accompanied by [employer].

## 2016-11-20 NOTE — Care Management Note (Signed)
Case Management Note  Patient Details  Name: Wayne [name] MRN: [id number] Date of Birth: 1941-03-10  Subjective/Objective:     Pt lives with spouse, will be discharged on [place] and fitted with life vest.  Provided card for free  30-day supply of Brillinta.  Joesph July, PA, faxed documentation for life vest.                Expected Discharge Plan:  Home/Self Care  Discharge planning Services  CM Consult  DME Arranged:  Life Vest DME Agency:   [facility]  Status of Service:  In process, will continue to follow  Girard Cooter, RN 11/20/2016, 7:09 AM

## 2016-11-21 ENCOUNTER — Ambulatory Visit (HOSPITAL_COMMUNITY): Payer: Medicare Other

## 2016-11-24 ENCOUNTER — Ambulatory Visit (HOSPITAL_COMMUNITY): Payer: Medicare Other

## 2016-11-26 ENCOUNTER — Ambulatory Visit (HOSPITAL_COMMUNITY): Payer: Medicare Other

## 2016-11-27 ENCOUNTER — Telehealth (HOSPITAL_COMMUNITY): Payer: Self-pay

## 2016-11-27 NOTE — Telephone Encounter (Signed)
Spoke to patient to follow up hospitalization and give Research appointment time Dec 20th. He states he feels so much better. He looks forward to starting cardiac rehab in the near future.

## 2016-11-28 ENCOUNTER — Ambulatory Visit (HOSPITAL_COMMUNITY): Payer: Medicare Other

## 2016-12-01 ENCOUNTER — Ambulatory Visit (HOSPITAL_COMMUNITY): Payer: Medicare Other

## 2016-12-02 ENCOUNTER — Other Ambulatory Visit: Payer: Medicare Other

## 2016-12-03 ENCOUNTER — Ambulatory Visit (HOSPITAL_COMMUNITY)
Admit: 2016-12-03 | Discharge: 2016-12-03 | Disposition: A | Discharge status: Home / Self Care | Payer: Medicare Other | Source: Ambulatory Visit | Attending: Internal Medicine | Admitting: Internal Medicine

## 2016-12-03 ENCOUNTER — Ambulatory Visit (HOSPITAL_COMMUNITY): Payer: Medicare Other

## 2016-12-03 ENCOUNTER — Telehealth (HOSPITAL_COMMUNITY): Payer: Self-pay | Admitting: *Deleted

## 2016-12-03 ENCOUNTER — Other Ambulatory Visit (HOSPITAL_COMMUNITY): Payer: Self-pay

## 2016-12-03 ENCOUNTER — Other Ambulatory Visit: Payer: Self-pay | Admitting: Pharmacist

## 2016-12-03 VITALS — BP 130/74 | HR 97 | Wt 228.0 lb

## 2016-12-03 DIAGNOSIS — Z8249 Family history of ischemic heart disease and other diseases of the circulatory system: Secondary | ICD-10-CM | POA: Diagnosis not present

## 2016-12-03 DIAGNOSIS — Z79899 Other long term (current) drug therapy: Secondary | ICD-10-CM | POA: Diagnosis not present

## 2016-12-03 DIAGNOSIS — Z6832 Body mass index (BMI) 32.0-32.9, adult: Secondary | ICD-10-CM | POA: Diagnosis not present

## 2016-12-03 DIAGNOSIS — I255 Ischemic cardiomyopathy: Secondary | ICD-10-CM | POA: Diagnosis not present

## 2016-12-03 DIAGNOSIS — Z87891 Personal history of nicotine dependence: Secondary | ICD-10-CM | POA: Diagnosis not present

## 2016-12-03 DIAGNOSIS — M353 Polymyalgia rheumatica: Secondary | ICD-10-CM | POA: Insufficient documentation

## 2016-12-03 DIAGNOSIS — E785 Hyperlipidemia, unspecified: Secondary | ICD-10-CM | POA: Diagnosis not present

## 2016-12-03 DIAGNOSIS — E669 Obesity, unspecified: Secondary | ICD-10-CM | POA: Insufficient documentation

## 2016-12-03 DIAGNOSIS — M1990 Unspecified osteoarthritis, unspecified site: Secondary | ICD-10-CM | POA: Diagnosis not present

## 2016-12-03 DIAGNOSIS — I5022 Chronic systolic (congestive) heart failure: Secondary | ICD-10-CM

## 2016-12-03 DIAGNOSIS — I110 Hypertensive heart disease with heart failure: Secondary | ICD-10-CM | POA: Diagnosis not present

## 2016-12-03 DIAGNOSIS — Z7982 Long term (current) use of aspirin: Secondary | ICD-10-CM | POA: Insufficient documentation

## 2016-12-03 DIAGNOSIS — E119 Type 2 diabetes mellitus without complications: Secondary | ICD-10-CM | POA: Insufficient documentation

## 2016-12-03 DIAGNOSIS — I739 Peripheral vascular disease, unspecified: Secondary | ICD-10-CM | POA: Insufficient documentation

## 2016-12-03 DIAGNOSIS — I2510 Atherosclerotic heart disease of native coronary artery without angina pectoris: Secondary | ICD-10-CM | POA: Diagnosis not present

## 2016-12-03 DIAGNOSIS — Z888 Allergy status to other drugs, medicaments and biological substances status: Secondary | ICD-10-CM | POA: Insufficient documentation

## 2016-12-03 DIAGNOSIS — Z7984 Long term (current) use of oral hypoglycemic drugs: Secondary | ICD-10-CM | POA: Insufficient documentation

## 2016-12-03 DIAGNOSIS — G4733 Obstructive sleep apnea (adult) (pediatric): Secondary | ICD-10-CM | POA: Insufficient documentation

## 2016-12-03 DIAGNOSIS — Z8546 Personal history of malignant neoplasm of prostate: Secondary | ICD-10-CM | POA: Diagnosis not present

## 2016-12-03 DIAGNOSIS — Z006 Encounter for examination for normal comparison and control in clinical research program: Secondary | ICD-10-CM

## 2016-12-03 LAB — BASIC METABOLIC PANEL WITH GFR
Anion gap: 10 (ref 5–15)
BUN: 29 mg/dL — ABNORMAL HIGH (ref 6–20)
CO2: 25 mmol/L (ref 22–32)
Calcium: 9.6 mg/dL (ref 8.9–10.3)
Chloride: 102 mmol/L (ref 101–111)
Creatinine, Ser: 1.34 mg/dL — ABNORMAL HIGH (ref 0.61–1.24)
GFR calc Af Amer: 59 mL/min — ABNORMAL LOW (ref >60)
GFR calc non Af Amer: 51 mL/min — ABNORMAL LOW (ref >60)
Glucose, Bld: 159 mg/dL — ABNORMAL HIGH (ref 65–99)
Potassium: 4.5 mmol/L (ref 3.5–5.1)
Sodium: 137 mmol/L (ref 135–145)

## 2016-12-03 MED ORDER — SPIRONOLACTONE 25 MG PO TABS: 25.0000 mg | ORAL_TABLET | Freq: Every day | ORAL | 6 refills | Status: DC

## 2016-12-03 NOTE — Telephone Encounter (Signed)
Pt called and left two messages.  Returned call this morning.  Left message for pt to please contact to reschedule the orientation appt. for cardiac rehab.  Pt previously scheduled to attend cardiac rehab.  This was placed on hold due to upcoming cath.  Pt had stent placed on 12/4.  Contact information provided. Cherre Huger, BSN

## 2016-12-03 NOTE — Patient Instructions (Signed)
INCREASE Spironolactone to 25 mg (1 whole tablet) once daily.  Samples of Brilinta provided. Doroteo Bradford (CHF Pharmacist) working on enrollment in Risk manager.  Routine lab work today. Will notify you of abnormal results, otherwise no news is good news!  Return in 3 weeks for follow up with Dr. Aundra Dubin.  Merry Christmas and Happy New Year!  Do the following things EVERYDAY: 1) Weigh yourself in the morning before breakfast. Write it down and keep it in a log. 2) Take your medicines as prescribed 3) Eat low salt foods-Limit salt (sodium) to 2000 mg per day.  4) Stay as active as you can everyday 5) Limit all fluids for the day to less than 2 liters

## 2016-12-03 NOTE — Progress Notes (Signed)
Patient ID: Wayne [name], male   DOB: [birth year], 75 y.o.   MRN: [id number]   Advanced Heart Failure Clinic Note   Patient ID: Wayne [name], male   DOB: 19-Dec-1940, 76 y.o.   MRN: [id number] PCP: Dr. Elease Hashimoto Cardiology: Dr. Aundra Dubin  75 yo with history of DM, HTN, CAD, and ischemic cardiomyopathy presents for cardiology followup. Patient had a CHF exacerbation in 2/12 and was found to have LV systolic dysfunction with EF around 20%. LHC showed RCA, ramus, and CFX disease. RCA was subtotally occluded. Cardiac MRI showed that all walls, including the inferior wall, should be viable. Patient therefore underwent opening of his chronic totally occluded RCA as well as PCI to the ramus in 2/12. He received drug eluting stents and was on Effient.  He had an echo in 2/13 that showed EF improved to 45% with moderate LV dilation and mild LV hypertrophy.   In 5/13, he developed a large left lower leg hematoma.  He was still on Effient at that time.  ASA and Effient were stopped.  The hematoma did not resolve.  In 7/13, he was re-admitted with septic shock from MSSA from an abscess in his left gastrocnemius.  He also grew Pseudomonas from the left gastrocnemius as well.  He had a prolonged course in the hospital and later in a rehab facility.  Ultimately, he got back home again.  I had him get an echo in 1/14, and this showed EF 15% with diffuse hypokinesis and inferoposterior akinesis.  He had been off of most of his prior cardiac medications.  I took him back for [facility] in 1/14.  This showed subtotal occlusion of a small AV LCx and 95% ostial in-stent restenosis in the RCA.  He was treated in 2/14 with a Xience DES to the ostial RCA and begun on Plavix.  Unfortunately, he developed diffuse hives after starting Plavix and had to be switched to ticlopidine.  He has tolerated ticlopidine.   Echo done in 12/14 showed some improvement in LV function but EF was still low at 30-35%.  He did not want ICD.    Presented to ED  11/14/15 with worsening SOB after a few steps and CXR with CHF and bilateral effusions in the setting of marked medical non-compliance. States he stopped taking his medicines in 01/2015 (except for ASA 81 and a multivitamin).  He was feeling good and decided that he did not need them anymore.  Also c/o URI symptoms. Diuresed over 2 L with IV diuretics in the ER and started on lasix 20 mg daily.   Echo (12/16) showed EF 20-25% with diffuse hypokinesis (down from 35% in 12/15).  Echo 10/17 showed EF 15% with mildly decreased RV systolic function.   He has had some difficulty with medication compliance.  However, he has is now in the BEAT-HF trial medical arm and visits with the research nurse have really helped his medication compliance. He is currently taking meds as ordered.   He was seen at pharmacy clinic at [facility] recently.  He was taken off Toprol XL and started on metoprolol tartrate.  He was put on low dose pravastatin rather than Repatha.  CPX in 10/17 showed severe functional limitation due to heart failure.   Admitted 12/1 through 11/20/16 with volume overload. Diuresed with IV lasix Had PCI to LAD on 11/17/2016. Required short term impella. Discharged with Life Vest. Discharge 220 pounds.   Today he returns for post hospital follow up. Overall feeling good.  Denies SOB/PND/Orthopnea. No CP. Ambulates without difficulty. Weight at home 216-218 pounds. No problems with Life Vest. Appetite ok. Taking all medications.   cMRI 11/14/16 showed EF 11%, Moderately dilated RV with moderate systolic dysfunction, Moderate MR, and > 50% thickness subendocardial LGE in the basal to mid-inferior wall. Unlikely to show significant improvement with revascularization. Notably, LAD territory thought to be viable.  Labs (12/16): K 4.9, creatinine 1.23 Labs (1/17): K 4.7, creatinine 1.15, BNP 1433 Labs (2/17): K 4.7, creatinine 1.14, BNP 870 Labs (3/17): K 4.5, creatinine 1.10, BNP 1257 Labs (5/17): K 4.2,  creatinine 1.2, BNP 818 Labs (7/17): LDL 149, HDL 40, TGs 210 Labs (9/17): K 4.9, creatinine 1.36 Labs (10/17): K 3.7, creatinine 1.2, LDL 87, HDL 32, LFTs normal  Past Medical History:  1. HYPERTENSION  2. HYPERLIPIDEMIA: Myalgias with atorvastatin and Crestor 3. ECZEMA  4. RHINITIS  5. HERPES ZOSTER OPHTHALMICUS  6. ADENOCARCINOMA, PROSTATE: Status post prostatectomy in 2009. Has had some incontinence since then.  7. Diabetes mellitus type II  8. Arthritis  9. Obesity  10. GERD: rare  11. CAD: Presented with exertional dyspnea, never had chest pain. LHC (2/12) with subtotalled proximal RCA and left to right collaterals, 90% proximal moderate-sized ramus, 95% proximal relatively small CFX, 40-50% proximal LAD. Cardiac MRI (2/12) showed EF 21%, some mild scar in basal segments but all wall segments would be expected to be viable. DES x 5 (overlapping) to RCA, DES x 1 to RI 01/30/11 .  Bled into leg with Effient use (long, complicated course).  LHC (1/14): AV LCx small with subtotal occlusion, 95% ostial instent restenosis in RCA. PCI in 2/14 to ostial RCA with 3.5 x 15 Xience DES.  Plavix allergy (hives) so put on ticlopidine.  12. Ischemic CMP: Echo (2/12) with moderately dilated LV, EF about 20% with diffuse hypokinesis and inferior akinesis, pseudonormal diastolic function, mild MR, severe LAE, mildly decreased RV systolic function. RHC (2/12) with mean RA 12, PA 40/25, mean PCWP 26, CI 2.1.  Echo (5/12) with EF 40% (appeared worse to my eye) with posterior HK, basal inferior AK, inferoseptal AK, basal anteroseptal AK, mild MR.  Cardiac MRI was repeated and showed EF 32% (improved from 21%) and mild LV dilation (was severely dilated before) with diffuse hypokinesis and subendocardial scar in the basal inferior, basal posterior, and basal anterolateral segments.  Echo (2/13) with EF 45%, moderate LV dilation, mild LVH.  Echo (1/14) with EF 15%, diffuse hypokinesis, inferoposterior akinesis, mild  MR, grade I diastolic dysfunction.  Echo (5/14) with EF 30-35%, mild LV dilation, akinesis of the basal inferior wall otherwise diffuse hypokinesis.  Echo (12/14) with EF 30-35%, mild LV dilation, diffuse hypokinesis with basal inferior and posterior akinesis. Echo (12/15) with EF 35%, mildly dilated LV, wall motion abnormalities, normal RV size and systolic function. Echo (12/16) with EF 20-25%, diffuse hypokinesis, severe LV dilation, mild MR.  - Echo (10/17): EF 15%, grade II diastolic dysfunction, mild MR, mildly decreased RV systolic function.  - Possible angioedema related to Entresto.  - Hyperkalemia with spironolactone 12.5 daily.  - CPX (10/17): peak VO2 10.6, VE/VCO2 slope 48.6, RER 1.12.  Severe HF limitation.  13. Cervical OA.  14. Polymyalgia rheumatica 15. OSA: Severe on sleep study 10/12.  On CPAP.  16. Left lower leg hematoma in setting of Effient use.  He developed a left gastrocnemius abscess and septic shock with prolonged hospitalization beginning in 7/13.  17. PAD: Lower extremity arterial doppler evaluation in 2/17 showed occluded  peroneal arteries bilaterally, ABI 1.1 (R) and 1.0 (L).   18. Carotid dopplers (10/17) with minimal disease. 19. cMRI 11/14/16 showed EF 11%, Moderately dilated RV with moderate systolic dysfunction, Moderate MR, and > 50% thickness subendocardial LGE in the basal to mid-inferior wall. Unlikely to show significant improvement with revascularization. Notably, LAD territory thought to be viable..   45. S/P PCI LAD 11/17/2016  Family History:  Father died with MI at age 61. He was an alcoholic. Mother died with cancer at around 49.   Social History:  Occupation: retired Administrator  Married, lives in [place]  Past smoker, quit around [place]: all systems reviewed and negative except as per HPI.    Current Outpatient Prescriptions  Medication Sig Dispense Refill  . acetaminophen (TYLENOL) 500 MG tablet Take 500 mg by mouth daily as needed  for moderate pain or headache.    Marland Kitchen aspirin EC 81 MG tablet Take 81 mg by mouth daily.    . carvedilol (COREG) 3.125 MG tablet Take 1 tablet (3.125 mg total) by mouth 2 (two) times daily. 60 tablet 3  . digoxin (LANOXIN) 0.125 MG tablet Take 1 tablet (0.125 mg total) by mouth daily. 30 tablet 6  . diphenhydramine-acetaminophen (TYLENOL PM) 25-500 MG TABS tablet Take 1 tablet by mouth at bedtime as needed (sleep).    . furosemide (LASIX) 80 MG tablet Take 1 tablet (80 mg total) by mouth 2 (two) times daily. 60 tablet 3  . glimepiride (AMARYL) 2 MG tablet Take 1 tablet (2 mg total) by mouth daily before breakfast. 30 tablet 3  . glucose blood (ACCU-CHEK AVIVA) test strip Check blood sugars 1-2 times per day. DX: E11.65 200 each 5  . Lancets (ACCU-CHEK SOFT TOUCH) lancets Check blood sugars 1-2 times per day. DX: E11.65 200 each 5  . losartan (COZAAR) 25 MG tablet Take 0.5 tablets (12.5 mg total) by mouth daily. 15 tablet 6  . Multiple Vitamins-Minerals (CENTRUM SILVER ADULT 50+) TABS Take 1 tablet by mouth daily.    . potassium chloride SA (K-DUR,KLOR-CON) 20 MEQ tablet Take 2 tablets (40 mEq total) by mouth daily. 60 tablet 6  . pravastatin (PRAVACHOL) 20 MG tablet Take 1 tablet (20 mg total) by mouth every evening. 30 tablet 11  . spironolactone (ALDACTONE) 25 MG tablet Take 0.5 tablets (12.5 mg total) by mouth daily. 15 tablet 6  . ticagrelor (BRILINTA) 90 MG TABS tablet Take 1 tablet (90 mg total) by mouth 2 (two) times daily. 60 tablet 6   No current facility-administered medications for this encounter.     BP 130/74 (BP Location: Left Arm, Patient Position: Sitting, Cuff Size: Normal)   Pulse 97   Wt 228 lb (103.4 kg)   SpO2 95%   BMI 32.71 kg/m    Wt Readings from Last 3 Encounters:  12/03/16 228 lb (103.4 kg)  11/20/16 220 lb 0.3 oz (99.8 kg)  11/10/16 236 lb (107 kg)    General: NAD, obese. Life Vest on.  Neck: Thick, JVP 5-6 cm, no thyromegaly or thyroid nodule. No carotid  bruit.  HEENT: Normal.  Lungs: CTAB CV: Nondisplaced PMI. Heart regular S1/S2, no S3/S4, no murmur. Difficult to palpate pedal pulses.  Abdomen: Soft, NT, mild/moderate distention, no HSM. No bruits or masses. +BS  Neurologic: Alert and oriented x 3.  Psych: Pleasant affect.  Extremities: No clubbing or cyanosis. R and LLE trace edema.   Assessment/Plan:  1. Chronic systolic CHF: Ischemic cardiomyopathy.  10/17 echo with  EF 15%, diffuse hypokinesis. CPX in 10/17 with severe HF limitation.S/P PCI 12/4    NYHA II. Volume status stable. Continue lasix to 80 mg bid.   - Increase spiro to 25 mg daily. ff spironolactone with elevated K.  - Continue losartan 12.5 mg bid. Of note, patient had possible angioedema related to [facility]. - Continue Coreg 3.125 mg bid.   Continue digoxin 0.125 mg daily.  - Continue losartan 12.5 mg daily. Anticipate switching to entresto next visit.  -  Continue Life Vest. Plan to repeat ECHO in a few months.He is willing to pursue ICD.  - He may be an LVAD candidate in the future.   - He has been referred to cardiac rehab.  2. CAD: No CP.  PCI 11/17/2016 to LAD. On brillinta, statin, and aspirin. PCI 01/2013. Completed his year-long course of ticlopidine.  He was allergic to Plavix and had severe gastrocnemius bleed with Effient. - Continue ASA 81 and pravastatin 20 mg daily. 3. HLD: Has had myalgias with Crestor and atorvastatin.  He is now on pravastatin 20 mg daily.   4. PAD: Occluded peroneal arteries bilaterally.  No significant claudication.  5. Diabetes type II: Unable to get empagliflozin.   BMET today. He has been referred to cardiac rehab. Provided with brillinta samples.  Follow up in  3 weeks with Dr Aundra Dubin.    Arrayah Connors NP-C  12/03/2016

## 2016-12-03 NOTE — Progress Notes (Signed)
Advanced Heart Failure Medication Review by a Pharmacist  Does the patient  feel that his/her medications are working for him/her?  yes  Has the patient been experiencing any side effects to the medications prescribed?  no  Does the patient measure his/her own blood pressure or blood glucose at home?  yes   Does the patient have any problems obtaining medications due to transportation or finances?   Yes - no Rx insurance  Understanding of regimen: good Understanding of indications: good Potential of compliance: good Patient understands to avoid NSAIDs. Patient understands to avoid decongestants.  Issues to address at subsequent visits: None   Pharmacist comments: Wayne [name] is a pleasant 75 yo M presenting without a medication list but with good recall of his regimen. He reports good compliance with his regimen but does state that he still does not have any Rx insurance so will enroll him in [place] program for assistance with [name]. Velva Harman, PharmD, BCPS, CPP Clinical Pharmacist Pager: [phone] Phone: [phone] 12/03/2016 10:13 AM      Time with patient: 10 minutes Preparation and documentation time: 12 minutes Total time: 22 minutes

## 2016-12-03 NOTE — Progress Notes (Addendum)
Patient seen in Heart Failure Clinic today. He verbalized he is doing well and feels much better since PCI. He verbalized diet and medication compliance and states he is happy for some "quality" to his life. States "that little depression I was feeling is gone, and I'm looking forward to the Christmas holiday with my family." States his bare naked weight at home is stable at 216-218 lbs, he is dressed heavy now and feels that's why he is weighing 228 lbs. Providers feels patient is not showing any signs of fluid overload. Will follow up with Research in a month. Verbalizes signs and symptoms for which to call Heart Failure Clinic. Very pleasant and appreciative of care.

## 2016-12-05 ENCOUNTER — Ambulatory Visit (HOSPITAL_COMMUNITY): Payer: Medicare Other

## 2016-12-09 ENCOUNTER — Other Ambulatory Visit: Payer: Medicare Other

## 2016-12-10 ENCOUNTER — Ambulatory Visit (HOSPITAL_COMMUNITY): Payer: Medicare Other

## 2016-12-12 ENCOUNTER — Ambulatory Visit (HOSPITAL_COMMUNITY): Payer: Medicare Other

## 2016-12-16 ENCOUNTER — Telehealth (HOSPITAL_COMMUNITY): Payer: Self-pay | Admitting: Cardiac Rehabilitation

## 2016-12-16 ENCOUNTER — Encounter (HOSPITAL_COMMUNITY)
Admission: RE | Admit: 2016-12-16 | Discharge: 2016-12-16 | Disposition: A | Discharge status: Home / Self Care | Payer: Medicare Other | Source: Ambulatory Visit | Attending: Cardiology | Admitting: Cardiology

## 2016-12-16 VITALS — BP 108/64 | HR 98 | Ht 67.5 in | Wt 226.0 lb

## 2016-12-16 DIAGNOSIS — E669 Obesity, unspecified: Secondary | ICD-10-CM | POA: Insufficient documentation

## 2016-12-16 DIAGNOSIS — Z8546 Personal history of malignant neoplasm of prostate: Secondary | ICD-10-CM | POA: Insufficient documentation

## 2016-12-16 DIAGNOSIS — I2510 Atherosclerotic heart disease of native coronary artery without angina pectoris: Secondary | ICD-10-CM | POA: Insufficient documentation

## 2016-12-16 DIAGNOSIS — K219 Gastro-esophageal reflux disease without esophagitis: Secondary | ICD-10-CM | POA: Insufficient documentation

## 2016-12-16 DIAGNOSIS — Z6834 Body mass index (BMI) 34.0-34.9, adult: Secondary | ICD-10-CM | POA: Diagnosis not present

## 2016-12-16 DIAGNOSIS — Z955 Presence of coronary angioplasty implant and graft: Secondary | ICD-10-CM | POA: Diagnosis not present

## 2016-12-16 DIAGNOSIS — I5042 Chronic combined systolic (congestive) and diastolic (congestive) heart failure: Secondary | ICD-10-CM | POA: Insufficient documentation

## 2016-12-16 DIAGNOSIS — M353 Polymyalgia rheumatica: Secondary | ICD-10-CM | POA: Insufficient documentation

## 2016-12-16 DIAGNOSIS — Z7982 Long term (current) use of aspirin: Secondary | ICD-10-CM | POA: Insufficient documentation

## 2016-12-16 DIAGNOSIS — I110 Hypertensive heart disease with heart failure: Secondary | ICD-10-CM | POA: Insufficient documentation

## 2016-12-16 DIAGNOSIS — E119 Type 2 diabetes mellitus without complications: Secondary | ICD-10-CM | POA: Diagnosis not present

## 2016-12-16 DIAGNOSIS — I255 Ischemic cardiomyopathy: Secondary | ICD-10-CM | POA: Diagnosis not present

## 2016-12-16 DIAGNOSIS — E785 Hyperlipidemia, unspecified: Secondary | ICD-10-CM | POA: Diagnosis not present

## 2016-12-16 DIAGNOSIS — M1990 Unspecified osteoarthritis, unspecified site: Secondary | ICD-10-CM | POA: Diagnosis not present

## 2016-12-16 DIAGNOSIS — Z79899 Other long term (current) drug therapy: Secondary | ICD-10-CM | POA: Diagnosis not present

## 2016-12-16 DIAGNOSIS — Z7984 Long term (current) use of oral hypoglycemic drugs: Secondary | ICD-10-CM | POA: Insufficient documentation

## 2016-12-16 DIAGNOSIS — G4733 Obstructive sleep apnea (adult) (pediatric): Secondary | ICD-10-CM | POA: Diagnosis not present

## 2016-12-16 DIAGNOSIS — Z87891 Personal history of nicotine dependence: Secondary | ICD-10-CM | POA: Diagnosis not present

## 2016-12-16 NOTE — Progress Notes (Signed)
Cardiac Rehab Medication Review by a Pharmacist  Does the patient  feel that his/her medications are working for him/her?  yes  Has the patient been experiencing any side effects to the medications prescribed?  Yes, patient describes hematuria x1 day last week, but it resolved on its own and he did not report it to the doctor.   Does the patient measure his/her own blood pressure or blood glucose at home?  Yes, BP averages 120s/70s, BG averages 130s-140s  Does the patient have any problems obtaining medications due to transportation or finances?   No, patient does not have insurance, though. Patient is in the process of getting patient assistance through Dr. Aundra Dubin.  Understanding of regimen: fair Understanding of indications: fair Potential of compliance: good    Pharmacist comments: 76 yo male who presents in good spirits. He is performing lifestyle modifications, including diet and exercise. He is adherent to his medications; however, he describes itching and an episode of hematuria (which self-resolved). His itching started on Sunday and has continued intermittently. I counseled him to apply lotion as he believes it is due to dry skin. He was counseled on the indications and possible side-effects, as well as the need to remain adherent to appointments.  Dierdre Harness, Cain Sieve, PharmD Clinical Pharmacy Resident [phone] (Pager) 12/16/2016 2:10 PM

## 2016-12-16 NOTE — Telephone Encounter (Signed)
pc to [facility], Dr. Claris Gladden office to notify of pt reports of hematuria.  Pt informed to notify Dr. Aundra Dubin office if symptoms return.  Understanding verbalized.

## 2016-12-16 NOTE — Progress Notes (Signed)
Pt reported hematuria last week that lasted a few hours. Pt reports he noticed blood spot in his underwear and passed a blood clot with urination, at which time the symptoms resolved.  Pt denies other urinary symptoms, such as burning, pain, retention, incontinence, frequency or urgency. Pt advised to contact Dr. Aundra Dubin immediately if symptoms return.  Pt also reports mild skin irritation from lifevest.  pt states symptoms are relieved when he takes vest off for shower.  No rash or skin abnormality reported. Pt instructed to contact office if symptoms persist or worsen.  Understanding verbalized.   Lastly pt reports he is working with [facility] clinic for [place] patient assistance.

## 2016-12-17 ENCOUNTER — Telehealth (HOSPITAL_COMMUNITY): Payer: Self-pay | Admitting: *Deleted

## 2016-12-17 ENCOUNTER — Other Ambulatory Visit (HOSPITAL_COMMUNITY): Payer: Self-pay | Admitting: *Deleted

## 2016-12-17 ENCOUNTER — Ambulatory Visit (HOSPITAL_COMMUNITY): Payer: Medicare Other

## 2016-12-17 NOTE — Telephone Encounter (Signed)
Patient called complaining of productive cough that started yesterday.  No weight gain or swelling and taking all meds as prescribed.  He said he feels like he may be getting bronchitis and wants an antibiotic.  Advised him to call his PCP as this was not heart related.  He agrees and understands with no further questions.

## 2016-12-18 ENCOUNTER — Encounter (HOSPITAL_COMMUNITY): Payer: Self-pay

## 2016-12-18 NOTE — Progress Notes (Signed)
Cardiac Individual Treatment Plan  Patient Details  Name: Wayne [name] MRN: [id number] Date of Birth: 10-15-1941 Referring Provider:   Flowsheet Row CARDIAC REHAB PHASE II ORIENTATION from 12/16/2016 in [place]  Referring Provider  Loralie Champagne, MD      Initial Encounter Date:  [place] PHASE II ORIENTATION from 12/16/2016 in [place]  Date  12/16/16  Referring Provider  Loralie Champagne, MD      Visit Diagnosis: 11/17/16 S/P coronary artery stent placement  Patient's Home Medications on Admission:  Current Outpatient Prescriptions:  .  aspirin EC 81 MG tablet, Take 81 mg by mouth daily., Disp: , Rfl:  .  carvedilol (COREG) 3.125 MG tablet, Take 1 tablet (3.125 mg total) by mouth 2 (two) times daily., Disp: 60 tablet, Rfl: 3 .  digoxin (LANOXIN) 0.125 MG tablet, Take 1 tablet (0.125 mg total) by mouth daily., Disp: 30 tablet, Rfl: 6 .  diphenhydramine-acetaminophen (TYLENOL PM) 25-500 MG TABS tablet, Take 1 tablet by mouth at bedtime as needed (sleep)., Disp: , Rfl:  .  furosemide (LASIX) 80 MG tablet, Take 1 tablet (80 mg total) by mouth 2 (two) times daily., Disp: 60 tablet, Rfl: 3 .  glimepiride (AMARYL) 2 MG tablet, Take 1 tablet (2 mg total) by mouth daily before breakfast., Disp: 30 tablet, Rfl: 3 .  glucose blood (ACCU-CHEK AVIVA) test strip, Check blood sugars 1-2 times per day. DX: E11.65, Disp: 200 each, Rfl: 5 .  Lancets (ACCU-CHEK SOFT TOUCH) lancets, Check blood sugars 1-2 times per day. DX: E11.65, Disp: 200 each, Rfl: 5 .  losartan (COZAAR) 25 MG tablet, Take 0.5 tablets (12.5 mg total) by mouth daily., Disp: 15 tablet, Rfl: 6 .  Multiple Vitamins-Minerals (CENTRUM SILVER ADULT 50+) TABS, Take 1 tablet by mouth daily., Disp: , Rfl:  .  potassium chloride SA (K-DUR,KLOR-CON) 20 MEQ tablet, Take 2 tablets (40 mEq total) by mouth daily., Disp: 60 tablet, Rfl: 6 .  pravastatin (PRAVACHOL) 20  MG tablet, Take 1 tablet (20 mg total) by mouth every evening., Disp: 30 tablet, Rfl: 11 .  spironolactone (ALDACTONE) 25 MG tablet, Take 1 tablet (25 mg total) by mouth daily., Disp: 30 tablet, Rfl: 6 .  ticagrelor (BRILINTA) 90 MG TABS tablet, Take 1 tablet (90 mg total) by mouth 2 (two) times daily., Disp: 60 tablet, Rfl: 6 .  acetaminophen (TYLENOL) 500 MG tablet, Take 500 mg by mouth daily as needed for moderate pain or headache., Disp: , Rfl:   Past Medical History: Past Medical History:  Diagnosis Date  . Adenocarcinoma of prostate ([place])    s/p seed implants  . Arthritis   . Cellulitis of left leg    a. 0000000 complicated by septic shock  . Chronic combined systolic and diastolic CHF (congestive heart failure) ([place])   . Colon polyps   . CORONARY ATHEROSCLEROSIS NATIVE CORONARY ARTERY    a. 01/2011 Cath/PCI: LM nl, LAD 40-50p, D1 80-small, LCX 95-small, RI 90, RCA 100, EF 20%;  b. 01/2011 Card MRI - No transmural scar;  c. 01/2011 PCI RCA->5 Promus DES, RI->3.0x16 Promus DES; d. Cath 01/13/13 patent LAD & Ramus, diffuse LCx dz, RCA mult overlapping stents w/ 95% osital stenosis, EF 20%, s/p DES to ostial/prox RCA 01/24/13   . Diabetes mellitus, type 2 ([place])   . GERD (gastroesophageal reflux disease)   . Hematoma of leg    a. left leg hematoma 03/2012 in the setting of  asa/effient  . Herpes zoster ophthalmicus   . HYPERLIPIDEMIA    intolerant to Lipitor (myalgias)  . HYPERTENSION   . Ischemic cardiomyopathy    a. 01/2012 Echo EF 45%, mild LVH; b. [id number], grade 1 diastolic dysfunction, diffuse hypokinesis, inferoposterior akinesis   . Noncompliance   . Obesity   . OSA (obstructive sleep apnea)   . Polymyalgia rheumatica ([facility])     Tobacco Use: History  Smoking Status  . Former Smoker  . Packs/day: 0.30  . Years: 20.00  . Types: Cigarettes  . Quit date: 02/23/1989  Smokeless Tobacco  . Never Used    Labs: Recent Review Flowsheet Data    Labs for ITP Cardiac and Pulmonary Rehab  Latest Ref Rng & Units 07/08/2016 07/14/2016 10/06/2016 11/14/2016 11/14/2016   Cholestrol 0 - 200 mg/dL 233(H) - 146 - -   LDLCALC 0 - 99 mg/dL 149(H) - 87 - -   LDLDIRECT mg/dL - - - - -   HDL >40 mg/dL 40(L) - 32(L) - -   Trlycerides <150 mg/dL 218(H) - 135 - -   Hemoglobin A1c 4.6 - 6.5 % - 8.7(H) - - -   PHART 7.350 - 7.450 - - - - -   PCO2ART 35.0 - 45.0 mmHg - - - - -   HCO3 20.0 - 28.0 mmol/L - - - 24.6 24.6   TCO2 0 - 100 mmol/L - - - 26 26   ACIDBASEDEF 0.0 - 2.0 mmol/L - - - 1.0 1.0   O2SAT % - - - 57.0 57.0      Capillary Blood Glucose: Lab Results  Component Value Date   GLUCAP 208 (H) 11/20/2016   GLUCAP 160 (H) 11/20/2016   GLUCAP 152 (H) 11/19/2016   GLUCAP 103 (H) 11/19/2016   GLUCAP 165 (H) 11/19/2016     Exercise Target Goals: Date: 12/16/16  Exercise Program Goal: Individual exercise prescription set with THRR, safety & activity barriers. Participant demonstrates ability to understand and report RPE using BORG scale, to self-measure pulse accurately, and to acknowledge the importance of the exercise prescription.  Exercise Prescription Goal: Starting with aerobic activity 30 plus minutes a day, 3 days per week for initial exercise prescription. Provide home exercise prescription and guidelines that participant acknowledges understanding prior to discharge.  Activity Barriers & Risk Stratification:     Activity Barriers & Cardiac Risk Stratification - 12/16/16 1616      Activity Barriers & Cardiac Risk Stratification   Cardiac Risk Stratification High      6 Minute Walk:     6 Minute Walk    Row Name 12/16/16 1616         6 Minute Walk   Phase Initial     Distance 1175 feet     Walk Time 6 minutes     # of Rest Breaks 0     MPH 2.2     METS 2.35     RPE 13     VO2 Peak 8.24     Symptoms Yes (comment)     Comments 5/10 L hip/knee pain     Resting HR 98 bpm     Resting BP 108/64     Max Ex. HR 124 bpm     Max Ex. BP 134/62     2 Minute  Post BP 106/60        Initial Exercise Prescription:     Initial Exercise Prescription - 12/16/16 1600      Date of Initial  Exercise RX and Referring Provider   Date 12/16/16   Referring Provider Loralie Champagne, MD     Recumbant Bike   Level 2   Minutes 10   METs 1.2     NuStep   Level 2   Minutes 10   METs 1.5     Track   Laps 8   Minutes 10   METs 2.39     Prescription Details   Frequency (times per week) 3   Duration Progress to 30 minutes of continuous aerobic without signs/symptoms of physical distress     Intensity   THRR 40-80% of Max Heartrate 59-118   Ratings of Perceived Exertion 11-13   Perceived Dyspnea 0-4     Progression   Progression Continue progressive overload as per policy without signs/symptoms or physical distress.     Resistance Training   Training Prescription Yes   Weight 2lbs   Reps 10-12      Perform Capillary Blood Glucose checks as needed.  Exercise Prescription Changes:   Exercise Comments:   Discharge Exercise Prescription (Final Exercise Prescription Changes):   Nutrition:  Target Goals: Understanding of nutrition guidelines, daily intake of sodium 1500mg , cholesterol 200mg , calories 30% from fat and 7% or less from saturated fats, daily to have 5 or more servings of fruits and vegetables.  Biometrics:     Pre Biometrics - 12/16/16 1619      Pre Biometrics   Height 5' 7.5" (1.715 m)   Weight 225 lb 15.5 oz (102.5 kg)   Waist Circumference 46 inches   Hip Circumference 46 inches   Waist to Hip Ratio 1 %   BMI (Calculated) 34.9   Triceps Skinfold 12 mm   % Body Fat 32.4 %   Grip Strength 42 kg   Flexibility 18 in   Single Leg Stand 18 seconds       Nutrition Therapy Plan and Nutrition Goals:   Nutrition Discharge: Nutrition Scores:   Nutrition Goals Re-Evaluation:   Psychosocial: Target Goals: Acknowledge presence or absence of depression, maximize coping skills, provide positive support system.  Participant is able to verbalize types and ability to use techniques and skills needed for reducing stress and depression.  Initial Review & Psychosocial Screening:     Initial Psych Review & Screening - 12/18/16 0935      Family Dynamics   Good Support System? Yes   Comments A brief assesment reveals no further psychosocial needed at this time.     Barriers   Psychosocial barriers to participate in program There are no identifiable barriers or psychosocial needs.     Screening Interventions   Interventions Encouraged to exercise      Quality of Life Scores:     Quality of Life - 12/16/16 1357      Quality of Life Scores   Health/Function Pre 17.23 %   Socioeconomic Pre 21.5 %   Psych/Spiritual Pre 27 %   Family Pre 20.63 %   GLOBAL Pre 20.5 %      PHQ-9: Recent Review Flowsheet Data    Depression screen PHQ 2/9 09/30/2016   Decreased Interest 0   Down, Depressed, Hopeless 0   PHQ - 2 Score 0      Psychosocial Evaluation and Intervention:   Psychosocial Re-Evaluation:   Vocational Rehabilitation: Provide vocational rehab assistance to qualifying candidates.   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 Participant will state understanding/return demonstration of topics presented.  Learning Barriers/Preferences:     Learning Barriers/Preferences - 12/16/16 1455      Learning Barriers/Preferences   Learning Barriers Sight   Learning Preferences Verbal Instruction;Written Material;Skilled Demonstration;Individual Instruction;Group Instruction      Education Topics: Count Your Pulse:  -Group instruction provided by verbal instruction, demonstration,  patient participation and written materials to support subject.  Instructors address importance of being able to find your pulse and how to count your pulse when at home without a heart monitor.  Patients get hands on experience counting their pulse with staff help and individually.   Heart Attack, Angina, and Risk Factor Modification:  -Group instruction provided by verbal instruction, video, and written materials to support subject.  Instructors address signs and symptoms of angina and heart attacks.    Also discuss risk factors for heart disease and how to make changes to improve heart health risk factors.   Functional Fitness:  -Group instruction provided by verbal instruction, demonstration, patient participation, and written materials to support subject.  Instructors address safety measures for doing things around the house.  Discuss how to get up and down off the floor, how to pick things up properly, how to safely get out of a chair without assistance, and balance training.   Meditation and Mindfulness:  -Group instruction provided by verbal instruction, patient participation, and written materials to support subject.  Instructor addresses importance of mindfulness and meditation practice to help reduce stress and improve awareness.  Instructor also leads participants through a meditation exercise.    Stretching for Flexibility and Mobility:  -Group instruction provided by verbal instruction, patient participation, and written materials to support subject.  Instructors lead participants through series of stretches that are designed to increase flexibility thus improving mobility.  These stretches are additional exercise for major muscle groups that are typically performed during regular warm up and cool down.   Hands Only CPR Anytime:  -Group instruction provided by verbal instruction, video, patient participation and written materials to support subject.  Instructors co-teach with AHA  video for hands only CPR.  Participants get hands on experience with mannequins.   Nutrition I class: Heart Healthy Eating:  -Group instruction provided by PowerPoint slides, verbal discussion, and written materials to support subject matter. The instructor gives an explanation and review of the Therapeutic Lifestyle Changes diet recommendations, which includes a discussion on lipid goals, dietary fat, sodium, fiber, plant stanol/sterol esters, sugar, and the components of a well-balanced, healthy diet.   Nutrition II class: Lifestyle Skills:  -Group instruction provided by PowerPoint slides, verbal discussion, and written materials to support subject matter. The instructor gives an explanation and review of label reading, grocery shopping for heart health, heart healthy recipe modifications, and ways to make healthier choices when eating out.   Diabetes Question & Answer:  -Group instruction provided by PowerPoint slides, verbal discussion, and written materials to support subject matter. The instructor gives an explanation and review of diabetes co-morbidities, pre- and post-prandial blood glucose goals, pre-exercise blood glucose goals, signs, symptoms, and treatment of hypoglycemia and hyperglycemia, and foot care basics.   Diabetes Blitz:  -Group instruction provided by PowerPoint slides, verbal discussion, and written materials to support subject matter. The instructor gives an explanation and  review of the physiology behind type 1 and type 2 diabetes, diabetes medications and rational behind using different medications, pre- and post-prandial blood glucose recommendations and Hemoglobin A1c goals, diabetes diet, and exercise including blood glucose guidelines for exercising safely.    Portion Distortion:  -Group instruction provided by PowerPoint slides, verbal discussion, written materials, and food models to support subject matter. The instructor gives an explanation of serving size  versus portion size, changes in portions sizes over the last 20 years, and what consists of a serving from each food group.   Stress Management:  -Group instruction provided by verbal instruction, video, and written materials to support subject matter.  Instructors review role of stress in heart disease and how to cope with stress positively.     Exercising on Your Own:  -Group instruction provided by verbal instruction, power point, and written materials to support subject.  Instructors discuss benefits of exercise, components of exercise, frequency and intensity of exercise, and end points for exercise.  Also discuss use of nitroglycerin and activating EMS.  Review options of places to exercise outside of rehab.  Review guidelines for sex with heart disease.   Cardiac Drugs I:  -Group instruction provided by verbal instruction and written materials to support subject.  Instructor reviews cardiac drug classes: antiplatelets, anticoagulants, beta blockers, and statins.  Instructor discusses reasons, side effects, and lifestyle considerations for each drug class.   Cardiac Drugs II:  -Group instruction provided by verbal instruction and written materials to support subject.  Instructor reviews cardiac drug classes: angiotensin converting enzyme inhibitors (ACE-I), angiotensin II receptor blockers (ARBs), nitrates, and calcium channel blockers.  Instructor discusses reasons, side effects, and lifestyle considerations for each drug class.   Anatomy and Physiology of the Circulatory System:  -Group instruction provided by verbal instruction, video, and written materials to support subject.  Reviews functional anatomy of heart, how it relates to various diagnoses, and what role the heart plays in the overall system.   Knowledge Questionnaire Score:     Knowledge Questionnaire Score - 12/16/16 1357      Knowledge Questionnaire Score   Pre Score 19/24      Core Components/Risk  Factors/Patient Goals at Admission:     Personal Goals and Risk Factors at Admission - 12/16/16 1458      Core Components/Risk Factors/Patient Goals on Admission    Weight Management Yes;Weight Loss;Obesity;Weight Maintenance   Intervention Weight Management: Develop a combined nutrition and exercise program designed to reach desired caloric intake, while maintaining appropriate intake of nutrient and fiber, sodium and fats, and appropriate energy expenditure required for the weight goal.;Weight Management: Provide education and appropriate resources to help participant work on and attain dietary goals.;Weight Management/Obesity: Establish reasonable short term and long term weight goals.;Obesity: Provide education and appropriate resources to help participant work on and attain dietary goals.   Expected Outcomes Short Term: Continue to assess and modify interventions until short term weight is achieved;Long Term: Adherence to nutrition and physical activity/exercise program aimed toward attainment of established weight goal;Weight Maintenance: Understanding of the daily nutrition guidelines, which includes 25-35% calories from fat, 7% or less cal from saturated fats, less than 200mg  cholesterol, less than 1.5gm of sodium, & 5 or more servings of fruits and vegetables daily;Weight Loss: Understanding of general recommendations for a balanced deficit meal plan, which promotes 1-2 lb weight loss per week and includes a negative energy balance of [phone] kcal/d;Understanding recommendations for meals to include 15-35% energy as protein, 25-35% energy from  fat, 35-60% energy from carbohydrates, less than 200mg  of dietary cholesterol, 20-35 gm of total fiber daily;Understanding of distribution of calorie intake throughout the day with the consumption of 4-5 meals/snacks   Sedentary Yes   Intervention Provide advice, education, support and counseling about physical activity/exercise needs.;Develop an  individualized exercise prescription for aerobic and resistive training based on initial evaluation findings, risk stratification, comorbidities and participant's personal goals.   Expected Outcomes Achievement of increased cardiorespiratory fitness and enhanced flexibility, muscular endurance and strength shown through measurements of functional capacity and personal statement of participant.   Increase Strength and Stamina Yes   Intervention Provide advice, education, support and counseling about physical activity/exercise needs.;Develop an individualized exercise prescription for aerobic and resistive training based on initial evaluation findings, risk stratification, comorbidities and participant's personal goals.   Expected Outcomes Achievement of increased cardiorespiratory fitness and enhanced flexibility, muscular endurance and strength shown through measurements of functional capacity and personal statement of participant.   Improve shortness of breath with ADL's Yes   Intervention Provide education, individualized exercise plan and daily activity instruction to help decrease symptoms of SOB with activities of daily living.   Expected Outcomes Short Term: Achieves a reduction of symptoms when performing activities of daily living.   Diabetes Yes   Intervention Provide education about signs/symptoms and action to take for hypo/hyperglycemia.;Provide education about proper nutrition, including hydration, and aerobic/resistive exercise prescription along with prescribed medications to achieve blood glucose in normal ranges: Fasting glucose 65-99 mg/dL   Expected Outcomes Short Term: Participant verbalizes understanding of the signs/symptoms and immediate care of hyper/hypoglycemia, proper foot care and importance of medication, aerobic/resistive exercise and nutrition plan for blood glucose control.;Long Term: Attainment of HbA1C < 7%.   Hypertension Yes   Intervention Provide education on  lifestyle modifcations including regular physical activity/exercise, weight management, moderate sodium restriction and increased consumption of fresh fruit, vegetables, and low fat dairy, alcohol moderation, and smoking cessation.;Monitor prescription use compliance.   Expected Outcomes Short Term: Continued assessment and intervention until BP is < 140/76mm HG in hypertensive participants. < 130/59mm HG in hypertensive participants with diabetes, heart failure or chronic kidney disease.;Long Term: Maintenance of blood pressure at goal levels.   Lipids Yes   Intervention Provide education and support for participant on nutrition & aerobic/resistive exercise along with prescribed medications to achieve LDL 70mg , HDL >40mg .   Expected Outcomes Short Term: Participant states understanding of desired cholesterol values and is compliant with medications prescribed. Participant is following exercise prescription and nutrition guidelines.;Long Term: Cholesterol controlled with medications as prescribed, with individualized exercise RX and with personalized nutrition plan. Value goals: LDL < 70mg , HDL > 40 mg.   Personal Goal Other Yes   Personal Goal Improve walking tolerance and overall CV health.   Intervention Provide exercise programming to assist with improving exercise capacity and provide cardiac education classes to increase awareness and knowledge   Expected Outcomes Pt will be able to walk with little to no symptoms of leg pain. Pt will improve overall quality of life      Core Components/Risk Factors/Patient Goals Review:    Core Components/Risk Factors/Patient Goals at Discharge (Final Review):    ITP Comments:     ITP Comments    Row Name 12/16/16 1339           ITP Comments Dr Sherle Poe, Medical Director          Comments: Wayne [name] attended orientation from 1300 to 1515 to review rules and guidelines for  program. Completed 6 minute walk test, Intitial ITP, and exercise  prescription.  VSS. Telemetry-Sinus Rhythm with a bundle branch block. Wayne [name] did complain of left hip and knee pain during the walk test this resolved with rest. Wayne [name] is wearing his life vest. Asymptomatic.Wayne Gave RN BSN .

## 2016-12-19 ENCOUNTER — Ambulatory Visit (HOSPITAL_COMMUNITY): Payer: Medicare Other

## 2016-12-22 ENCOUNTER — Encounter (HOSPITAL_COMMUNITY): Payer: Self-pay

## 2016-12-22 ENCOUNTER — Encounter (HOSPITAL_COMMUNITY)
Admission: RE | Admit: 2016-12-22 | Discharge: 2016-12-22 | Disposition: A | Discharge status: Home / Self Care | Payer: Medicare Other | Source: Ambulatory Visit

## 2016-12-22 ENCOUNTER — Ambulatory Visit (HOSPITAL_COMMUNITY): Payer: Medicare Other

## 2016-12-22 ENCOUNTER — Other Ambulatory Visit: Payer: Medicare Other | Admitting: *Deleted

## 2016-12-22 ENCOUNTER — Encounter: Payer: Self-pay | Admitting: Family Medicine

## 2016-12-22 ENCOUNTER — Ambulatory Visit (INDEPENDENT_AMBULATORY_CARE_PROVIDER_SITE_OTHER): Payer: Medicare Other | Admitting: Family Medicine

## 2016-12-22 VITALS — BP 102/70 | HR 91 | Temp 98.0°F | Ht 67.5 in | Wt 228.5 lb

## 2016-12-22 DIAGNOSIS — R21 Rash and other nonspecific skin eruption: Secondary | ICD-10-CM | POA: Diagnosis not present

## 2016-12-22 DIAGNOSIS — E785 Hyperlipidemia, unspecified: Secondary | ICD-10-CM

## 2016-12-22 NOTE — Patient Instructions (Signed)
I do not think you have shingles Rash looks more follicular in nature Keep clean with soap and water daily Follow up for any fever or increased redness.

## 2016-12-22 NOTE — Progress Notes (Signed)
Pt arrived at cardiac rehab c/o rash on chest area.  Pt states., "I think I have shingles"  Upon assessment, raised erythema with scabbing present on left breast, with smaller raised pimple sized areas on right neck area, right arm and right leg.  Pt c/o itching in all areas.  Itching has been present 1-2 weeks.  Pt has appointment with Dr. Elease Hashimoto today at 3:45 and Dr. Aundra Dubin tomorrow at 36 am.  Nira Conn, Dr. Oleh Genin nurse informed of pt rash and pt advised to keep appt as scheduled.  Pt did not exercise, awaiting MD evaluation.

## 2016-12-22 NOTE — Progress Notes (Signed)
Pre visit review using our clinic review tool, if applicable. No additional management support is needed unless otherwise documented below in the visit note. 

## 2016-12-22 NOTE — Progress Notes (Signed)
Subjective:     Patient ID: Wayne [name], male   DOB: [birth year], 76 y.o.   MRN: [id number]  HPI Patient seen with left upper chest wall rash. He went to cardiac rehabilitation earlier today and they recommended he wait before starting any therapy. Patient relates 2-3 day history of left chest rash which has been mostly pruritic. Nonpainful. He has have past history of shingles and states this feels different. Denies any fever or chills. He states rash is already improved compared to 2 days ago.  Type 2 diabetes. We tried to get him covered with [employer] but he cannot because of cost issues. We were somewhat reluctance to start metformin because of his severe congestive heart failure history. We ended up starting back low-dose glimepiride 2 mg once daily. He refused insulin.  Blood sugars are improving but still has several fastings around 140-160 range  Past Medical History:  Diagnosis Date  . Adenocarcinoma of prostate ([place])    s/p seed implants  . Arthritis   . Cellulitis of left leg    a. 0000000 complicated by septic shock  . Chronic combined systolic and diastolic CHF (congestive heart failure) ([place])   . Colon polyps   . CORONARY ATHEROSCLEROSIS NATIVE CORONARY ARTERY    a. 01/2011 Cath/PCI: LM nl, LAD 40-50p, D1 80-small, LCX 95-small, RI 90, RCA 100, EF 20%;  b. 01/2011 Card MRI - No transmural scar;  c. 01/2011 PCI RCA->5 Promus DES, RI->3.0x16 Promus DES; d. Cath 01/13/13 patent LAD & Ramus, diffuse LCx dz, RCA mult overlapping stents w/ 95% osital stenosis, EF 20%, s/p DES to ostial/prox RCA 01/24/13   . Diabetes mellitus, type 2 ([place])   . GERD (gastroesophageal reflux disease)   . Hematoma of leg    a. left leg hematoma 03/2012 in the setting of asa/effient  . Herpes zoster ophthalmicus   . HYPERLIPIDEMIA    intolerant to Lipitor (myalgias)  . HYPERTENSION   . Ischemic cardiomyopathy    a. 01/2012 Echo EF 45%, mild LVH; b. [id number], grade 1 diastolic dysfunction, diffuse hypokinesis,  inferoposterior akinesis   . Noncompliance   . Obesity   . OSA (obstructive sleep apnea)   . Polymyalgia rheumatica ([facility])    Past Surgical History:  Procedure Laterality Date  . CARDIAC CATHETERIZATION  01/24/2013  . CARDIAC CATHETERIZATION N/A 11/14/2016   Procedure: Right/Left Heart Cath and Coronary Angiography;  Surgeon: Larey Dresser, MD;  Location: [facility];  Service: Cardiovascular;  Laterality: N/A;  . CARDIAC CATHETERIZATION N/A 11/17/2016   Procedure: Coronary Stent Intervention w/Impella;  Surgeon: Peter M Martinique, MD;  Location: [facility];  Service: Cardiovascular;  Laterality: N/A;  . CARDIAC CATHETERIZATION N/A 11/17/2016   Procedure: Coronary Atherectomy;  Surgeon: Peter M Martinique, MD;  Location: [facility];  Service: Cardiovascular;  Laterality: N/A;  . CORONARY ANGIOPLASTY WITH STENT PLACEMENT  01/24/2013   DES to RCA  . CORONARY STENT PLACEMENT  2012   reports 6 stents placed  . I&D EXTREMITY  06/15/2012   Procedure: IRRIGATION AND DEBRIDEMENT EXTREMITY;  Surgeon: Newt Minion, MD;  Location: [facility];  Service: Orthopedics;  Laterality: Left;  I&D Left Posterior Knee  . I&D EXTREMITY  06/30/2012   Procedure: IRRIGATION AND DEBRIDEMENT EXTREMITY;  Surgeon: Newt Minion, MD;  Location: [facility];  Service: Orthopedics;  Laterality: Left;  Left Leg Irrigation and Debridement and placement of Wound VAC and application of  A-cell  . I&D EXTREMITY  07/20/2012  Procedure: IRRIGATION AND DEBRIDEMENT EXTREMITY;  Surgeon: Newt Minion, MD;  Location: [facility];  Service: Orthopedics;  Laterality: Left;  Irrigation and Debridement Left Leg and Place antibiotic beads   . PERCUTANEOUS CORONARY STENT INTERVENTION (PCI-S) N/A 01/24/2013   Procedure: PERCUTANEOUS CORONARY STENT INTERVENTION (PCI-S);  Surgeon: Wellington Hampshire, MD;  Location: [facility];  Service: Cardiovascular;  Laterality: N/A;  . PROSTATE SURGERY     cancer, seed implant  . ULTRASOUND GUIDANCE FOR  VASCULAR ACCESS  11/14/2016   Procedure: Ultrasound Guidance For Vascular Access;  Surgeon: Larey Dresser, MD;  Location: [facility];  Service: Cardiovascular;;    reports that he quit smoking about 27 years ago. His smoking use included Cigarettes. He has a 6.00 pack-year smoking history. He has never used smokeless tobacco. He reports that he does not drink alcohol or use drugs. family history includes Alcohol abuse in his father; Cancer (age of onset: 27) in his mother; Heart attack (age of onset: 57) in his father; Heart disease in his father. Allergies  Allergen Reactions  . Plavix [Clopidogrel]     Nose bleeds, swellings, whelps on legs & back, itching  . Lipitor [Atorvastatin] Other (See Comments)    REACTION: sore legs  . Crestor [Rosuvastatin Calcium] Other (See Comments)    Myalgias, Interfering with Gait  . Entresto [Sacubitril-Valsartan]     Angioedema  . Ancef [Cefazolin] Rash    Describes itching and rash, but said 'it wasn't that bad'     Review of Systems  Constitutional: Negative for chills and fever.  Skin: Positive for rash.       Objective:   Physical Exam  Constitutional: He appears well-developed and well-nourished. No distress.  Cardiovascular: Normal rate and regular rhythm.   Pulmonary/Chest: Effort normal and breath sounds normal. No respiratory distress. He has no wheezes. He has no rales.  Skin: Rash noted.  Patient has small cluster of follicular type lesions left upper chest. No vesicles. Nontender.       Assessment:     Follicular rash left upper chest. Doubt shingles    Plan:     -Keep clean daily with good antibacterial soap such as Dial or Lever 2000 -Follow-up promptly for any fever, increased redness, or other concerns -Schedule follow-up by late February or early March to reassess his diabetes  [name] MD [facility]

## 2016-12-23 ENCOUNTER — Ambulatory Visit (HOSPITAL_COMMUNITY)
Admission: RE | Admit: 2016-12-23 | Discharge: 2016-12-23 | Disposition: A | Discharge status: Home / Self Care | Payer: Medicare Other | Source: Ambulatory Visit | Attending: Internal Medicine | Admitting: Internal Medicine

## 2016-12-23 ENCOUNTER — Telehealth (HOSPITAL_COMMUNITY): Payer: Self-pay | Admitting: *Deleted

## 2016-12-23 ENCOUNTER — Other Ambulatory Visit: Payer: Medicare Other | Admitting: *Deleted

## 2016-12-23 ENCOUNTER — Encounter (HOSPITAL_COMMUNITY): Payer: Self-pay

## 2016-12-23 VITALS — BP 126/70 | HR 87 | Wt 228.8 lb

## 2016-12-23 DIAGNOSIS — Z79899 Other long term (current) drug therapy: Secondary | ICD-10-CM | POA: Diagnosis not present

## 2016-12-23 DIAGNOSIS — Z8546 Personal history of malignant neoplasm of prostate: Secondary | ICD-10-CM | POA: Diagnosis not present

## 2016-12-23 DIAGNOSIS — G4733 Obstructive sleep apnea (adult) (pediatric): Secondary | ICD-10-CM | POA: Insufficient documentation

## 2016-12-23 DIAGNOSIS — Z955 Presence of coronary angioplasty implant and graft: Secondary | ICD-10-CM | POA: Insufficient documentation

## 2016-12-23 DIAGNOSIS — Z811 Family history of alcohol abuse and dependence: Secondary | ICD-10-CM | POA: Diagnosis not present

## 2016-12-23 DIAGNOSIS — E669 Obesity, unspecified: Secondary | ICD-10-CM | POA: Diagnosis not present

## 2016-12-23 DIAGNOSIS — E785 Hyperlipidemia, unspecified: Secondary | ICD-10-CM | POA: Diagnosis not present

## 2016-12-23 DIAGNOSIS — Z8249 Family history of ischemic heart disease and other diseases of the circulatory system: Secondary | ICD-10-CM | POA: Insufficient documentation

## 2016-12-23 DIAGNOSIS — Z87891 Personal history of nicotine dependence: Secondary | ICD-10-CM | POA: Diagnosis not present

## 2016-12-23 DIAGNOSIS — Z7984 Long term (current) use of oral hypoglycemic drugs: Secondary | ICD-10-CM | POA: Diagnosis not present

## 2016-12-23 DIAGNOSIS — Z7902 Long term (current) use of antithrombotics/antiplatelets: Secondary | ICD-10-CM | POA: Insufficient documentation

## 2016-12-23 DIAGNOSIS — I739 Peripheral vascular disease, unspecified: Secondary | ICD-10-CM | POA: Insufficient documentation

## 2016-12-23 DIAGNOSIS — I5022 Chronic systolic (congestive) heart failure: Secondary | ICD-10-CM | POA: Diagnosis not present

## 2016-12-23 DIAGNOSIS — Z6835 Body mass index (BMI) 35.0-35.9, adult: Secondary | ICD-10-CM | POA: Insufficient documentation

## 2016-12-23 DIAGNOSIS — I2510 Atherosclerotic heart disease of native coronary artery without angina pectoris: Secondary | ICD-10-CM | POA: Insufficient documentation

## 2016-12-23 DIAGNOSIS — I110 Hypertensive heart disease with heart failure: Secondary | ICD-10-CM | POA: Diagnosis not present

## 2016-12-23 DIAGNOSIS — M1990 Unspecified osteoarthritis, unspecified site: Secondary | ICD-10-CM | POA: Insufficient documentation

## 2016-12-23 DIAGNOSIS — E119 Type 2 diabetes mellitus without complications: Secondary | ICD-10-CM | POA: Diagnosis not present

## 2016-12-23 DIAGNOSIS — I255 Ischemic cardiomyopathy: Secondary | ICD-10-CM | POA: Diagnosis not present

## 2016-12-23 DIAGNOSIS — Z7982 Long term (current) use of aspirin: Secondary | ICD-10-CM | POA: Diagnosis not present

## 2016-12-23 DIAGNOSIS — K219 Gastro-esophageal reflux disease without esophagitis: Secondary | ICD-10-CM | POA: Diagnosis not present

## 2016-12-23 DIAGNOSIS — M353 Polymyalgia rheumatica: Secondary | ICD-10-CM | POA: Insufficient documentation

## 2016-12-23 DIAGNOSIS — Z9079 Acquired absence of other genital organ(s): Secondary | ICD-10-CM | POA: Insufficient documentation

## 2016-12-23 DIAGNOSIS — Z006 Encounter for examination for normal comparison and control in clinical research program: Secondary | ICD-10-CM

## 2016-12-23 LAB — BASIC METABOLIC PANEL
Anion gap: 8 (ref 5–15)
BUN: 34 mg/dL — AB (ref 6–20)
CO2: 25 mmol/L (ref 22–32)
CREATININE: 1.39 mg/dL — AB (ref 0.61–1.24)
Calcium: 9.8 mg/dL (ref 8.9–10.3)
Chloride: 102 mmol/L (ref 101–111)
GFR calc non Af Amer: 49 mL/min — ABNORMAL LOW (ref >60)
GFR, EST AFRICAN AMERICAN: 56 mL/min — AB (ref >60)
Glucose, Bld: 191 mg/dL — ABNORMAL HIGH (ref 65–99)
Potassium: 4.9 mmol/L (ref 3.5–5.1)
SODIUM: 135 mmol/L (ref 135–145)

## 2016-12-23 LAB — LIPID PANEL
CHOLESTEROL: 189 mg/dL (ref 0–200)
HDL: 33 mg/dL — ABNORMAL LOW (ref >40)
LDL Cholesterol: 104 mg/dL — ABNORMAL HIGH (ref 0–99)
Total CHOL/HDL Ratio: 5.7 RATIO
Triglycerides: 259 mg/dL — ABNORMAL HIGH (ref <150)
VLDL: 52 mg/dL — AB (ref 0–40)

## 2016-12-23 LAB — DIGOXIN LEVEL: Digoxin Level: 0.9 ng/mL (ref 0.8–2.0)

## 2016-12-23 MED ORDER — LOSARTAN POTASSIUM 25 MG PO TABS: 25.0000 mg | ORAL_TABLET | Freq: Two times a day (BID) | ORAL | 6 refills | Status: DC

## 2016-12-23 NOTE — Progress Notes (Signed)
Patient ID: MURAT DEIBEL, male   DOB: January 13, 1941, 76 y.o.   MRN: [id number]   Advanced Heart Failure Clinic Note   Patient ID: CLYDELL MAHANNAH, male   DOB: [birth year], 76 y.o.   MRN: [id number] PCP: Dr. Elease Hashimoto Cardiology: Dr. Aundra Dubin  76 yo with history of DM, HTN, CAD, and ischemic cardiomyopathy presents for cardiology followup. Patient had a CHF exacerbation in 2/12 and was found to have LV systolic dysfunction with EF around 20%. LHC showed RCA, ramus, and CFX disease. RCA was subtotally occluded. Cardiac MRI showed that all walls, including the inferior wall, should be viable. Patient therefore underwent opening of his chronic totally occluded RCA as well as PCI to the ramus in 2/12. He received drug eluting stents and was on Effient.  He had an echo in 2/13 that showed EF improved to 45% with moderate LV dilation and mild LV hypertrophy.   In 5/13, he developed a large left lower leg hematoma.  He was still on Effient at that time.  ASA and Effient were stopped.  The hematoma did not resolve.  In 7/13, he was re-admitted with septic shock from MSSA from an abscess in his left gastrocnemius.  He also grew Pseudomonas from the left gastrocnemius as well.  He had a prolonged course in the hospital and later in a rehab facility.  Ultimately, he got back home again.  I had him get an echo in 1/14, and this showed EF 15% with diffuse hypokinesis and inferoposterior akinesis.  He had been off of most of his prior cardiac medications.  I took him back for [facility] in 1/14.  This showed subtotal occlusion of a small AV LCx and 95% ostial in-stent restenosis in the RCA.  He was treated in 2/14 with a Xience DES to the ostial RCA and begun on Plavix.  Unfortunately, he developed diffuse hives after starting Plavix and had to be switched to ticlopidine.  He has tolerated ticlopidine.   Echo done in 12/14 showed some improvement in LV function but EF was still low at 30-35%.  He did not want ICD.    Presented to ED  11/14/15 with worsening SOB after a few steps and CXR with CHF and bilateral effusions in the setting of marked medical non-compliance. States he stopped taking his medicines in 01/2015 (except for ASA 81 and a multivitamin).  He was feeling good and decided that he did not need them anymore.  Also c/o URI symptoms. Diuresed over 2 L with IV diuretics in the ER and started on lasix 20 mg daily.   Echo (12/16) showed EF 20-25% with diffuse hypokinesis (down from 35% in 12/15).  Echo 10/17 showed EF 15% with mildly decreased RV systolic function.    He had left and right heart catheterization in 12/17. This showed elevated right and left heart filling pressures and low cardiac output. The RCA was totally occluded with collaterals, there was 95% ostial to mid LAD stenosis.  Patient had PCI with DES to proximal-mid LAD.    He has had some difficulty with medication compliance.  However, he has is now in the BEAT-HF trial medical arm and visits with the research nurse have really helped his medication compliance. He is currently taking meds as ordered.   He has been doing better since PCI.  He is wearing a Lifevest.  No dyspnea walking on flat ground or 1 flight of steps.  No chest pain.  No orthopnea/PND.  He is taking all his  meds.  Weight is stable.  He has started cardiac rehab.  Labs (12/16): K 4.9, creatinine 1.23 Labs (1/17): K 4.7, creatinine 1.15, BNP 1433 Labs (2/17): K 4.7, creatinine 1.14, BNP 870 Labs (3/17): K 4.5, creatinine 1.10, BNP 1257 Labs (5/17): K 4.2, creatinine 1.2, BNP 818 Labs (7/17): LDL 149, HDL 40, TGs 210 Labs (9/17): K 4.9, creatinine 1.36 Labs (10/17): K 3.7, creatinine 1.2, LDL 87, HDL 32, LFTs normal Labs (12/17): K 4.5, creatinine 1.34, digoxin 0.3  Past Medical History:  1. HYPERTENSION  2. HYPERLIPIDEMIA: Myalgias with atorvastatin and Crestor 3. ECZEMA  4. RHINITIS  5. HERPES ZOSTER OPHTHALMICUS  6. ADENOCARCINOMA, PROSTATE: Status post prostatectomy in 2009.  Has had some incontinence since then.  7. Diabetes mellitus type II  8. Arthritis  9. Obesity  10. GERD: rare  11. CAD: Presented with exertional dyspnea, never had chest pain. LHC (2/12) with subtotalled proximal RCA and left to right collaterals, 90% proximal moderate-sized ramus, 95% proximal relatively small CFX, 40-50% proximal LAD. Cardiac MRI (2/12) showed EF 21%, some mild scar in basal segments but all wall segments would be expected to be viable. DES x 5 (overlapping) to RCA, DES x 1 to RI 01/30/11 .  Bled into leg with Effient use (long, complicated course).  LHC (1/14): AV LCx small with subtotal occlusion, 95% ostial instent restenosis in RCA. PCI in 2/14 to ostial RCA with 3.5 x 15 Xience DES.  Plavix allergy (hives) so put on ticlopidine.  - LHC (12/17):  the RCA was totally occluded with collaterals, there was 95% ostial to mid LAD stenosis, patient had PCI with DES to proximal-mid LAD. 12. Ischemic CMP: Echo (2/12) with moderately dilated LV, EF about 20% with diffuse hypokinesis and inferior akinesis, pseudonormal diastolic function, mild MR, severe LAE, mildly decreased RV systolic function. RHC (2/12) with mean RA 12, PA 40/25, mean PCWP 26, CI 2.1.  Echo (5/12) with EF 40% (appeared worse to my eye) with posterior HK, basal inferior AK, inferoseptal AK, basal anteroseptal AK, mild MR.  Cardiac MRI was repeated and showed EF 32% (improved from 21%) and mild LV dilation (was severely dilated before) with diffuse hypokinesis and subendocardial scar in the basal inferior, basal posterior, and basal anterolateral segments.  Echo (2/13) with EF 45%, moderate LV dilation, mild LVH.  Echo (1/14) with EF 15%, diffuse hypokinesis, inferoposterior akinesis, mild MR, grade I diastolic dysfunction.  Echo (5/14) with EF 30-35%, mild LV dilation, akinesis of the basal inferior wall otherwise diffuse hypokinesis.  Echo (12/14) with EF 30-35%, mild LV dilation, diffuse hypokinesis with basal inferior and  posterior akinesis. Echo (12/15) with EF 35%, mildly dilated LV, wall motion abnormalities, normal RV size and systolic function. Echo (12/16) with EF 20-25%, diffuse hypokinesis, severe LV dilation, mild MR.  - Echo (10/17): EF 15%, grade II diastolic dysfunction, mild MR, mildly decreased RV systolic function.  - Possible angioedema related to Entresto.  - Hyperkalemia with spironolactone 12.5 daily.  - CPX (10/17): peak VO2 10.6, VE/VCO2 slope 48.6, RER 1.12.  Severe HF limitation.  - RHC (12/17): mean RA 14, PA 53/27, mean PCWP 25, CI 1.93.  13. Cervical OA.  14. Polymyalgia rheumatica 15. OSA: Severe on sleep study 10/12.  On CPAP.  16. Left lower leg hematoma in setting of Effient use.  He developed a left gastrocnemius abscess and septic shock with prolonged hospitalization beginning in 7/13.  17. PAD: Lower extremity arterial doppler evaluation in 2/17 showed occluded peroneal arteries bilaterally, ABI  1.1 (R) and 1.0 (L).   18. Carotid dopplers (10/17) with minimal disease.   Family History:  Father died with MI at age 28. He was an alcoholic. Mother died with cancer at around 10.   Social History:  Occupation: retired Administrator  Married, lives in [place]  Past smoker, quit around [place]: all systems reviewed and negative except as per HPI.    Current Outpatient Prescriptions  Medication Sig Dispense Refill  . acetaminophen (TYLENOL) 500 MG tablet Take 500 mg by mouth daily as needed for moderate pain or headache.    Marland Kitchen aspirin EC 81 MG tablet Take 81 mg by mouth daily.    . carvedilol (COREG) 3.125 MG tablet Take 1 tablet (3.125 mg total) by mouth 2 (two) times daily. 60 tablet 3  . digoxin (LANOXIN) 0.125 MG tablet Take 1 tablet (0.125 mg total) by mouth daily. 30 tablet 6  . diphenhydramine-acetaminophen (TYLENOL PM) 25-500 MG TABS tablet Take 1 tablet by mouth at bedtime as needed (sleep).    . furosemide (LASIX) 80 MG tablet Take 1 tablet (80 mg total) by mouth 2  (two) times daily. 60 tablet 3  . glimepiride (AMARYL) 2 MG tablet Take 1 tablet (2 mg total) by mouth daily before breakfast. 30 tablet 3  . glucose blood (ACCU-CHEK AVIVA) test strip Check blood sugars 1-2 times per day. DX: E11.65 200 each 5  . Lancets (ACCU-CHEK SOFT TOUCH) lancets Check blood sugars 1-2 times per day. DX: E11.65 200 each 5  . losartan (COZAAR) 25 MG tablet Take 1 tablet (25 mg total) by mouth 2 (two) times daily. 60 tablet 6  . Multiple Vitamins-Minerals (CENTRUM SILVER ADULT 50+) TABS Take 1 tablet by mouth daily.    . potassium chloride SA (K-DUR,KLOR-CON) 20 MEQ tablet Take 2 tablets (40 mEq total) by mouth daily. 60 tablet 6  . pravastatin (PRAVACHOL) 20 MG tablet Take 1 tablet (20 mg total) by mouth every evening. 30 tablet 11  . spironolactone (ALDACTONE) 25 MG tablet Take 1 tablet (25 mg total) by mouth daily. 30 tablet 6  . ticagrelor (BRILINTA) 90 MG TABS tablet Take 1 tablet (90 mg total) by mouth 2 (two) times daily. 60 tablet 6   No current facility-administered medications for this encounter.     BP 126/70   Pulse 87   Wt 228 lb 12 oz (103.8 kg) Comment: 216lbs  SpO2 99%   BMI 35.30 kg/m    Wt Readings from Last 3 Encounters:  12/23/16 228 lb 12 oz (103.8 kg)  12/22/16 228 lb 8 oz (103.6 kg)  12/16/16 225 lb 15.5 oz (102.5 kg)    General: NAD, obese.  Neck: Thick, JVP 7 cm, no thyromegaly or thyroid nodule. No carotid bruit.  HEENT: Normal.  Lungs: Bilateral rhonchi CV: Nondisplaced PMI. Heart regular S1/S2, no S3/S4, no murmur. Difficult to palpate pedal pulses.  Abdomen: Soft, NT, mild/moderate distention, no HSM. No bruits or masses. +BS  Neurologic: Alert and oriented x 3.  Psych: Pleasant affect.  Extremities: No clubbing or cyanosis. No edema.  Assessment/Plan:  1. Chronic systolic CHF: Ischemic cardiomyopathy.  10/17 echo with EF 15%, diffuse hypokinesis.  NYHA class III symptoms, slowly worsening.  CPX in 10/17 with severe HF  limitation.  RHC in 12/17 with elevated filling pressures and low cardiac output.  He is now s/p PCI to proximal to mid LAD with much improved symptoms.  Now NYHA class II with no volume overload on exam.  -  Continue Lasix 80 mg bid. BMET today.  - Continue spironolactone 25 daily.   - Increase losartan to 25 mg bid. Of note, patient had possible angioedema related to [facility].  Repeat BMET in 10 days.  - Continue Coreg 3.125 mg bid.  - Continue digoxin, check level today.    - Repeat echo in 3/18.  If EF is < 35%, will need ICD and can stop Lifevest. He would not be a CRT candidate with narrow QRS.  - He may be an LVAD candidate in the future.  As above, compliance has improved.  He will meet our LVAD nurse today.  - Continue cardiac rehab.  2. CAD:  Now s/p PCI to proximal LAD.  He had an occluded RCA but there were left to right collaterals.  Much improved symptomatically.  - Continue ASA 81 and pravastatin 20 mg daily => working on [place]atha through pharmacy clinic.  3. HLD: Has had myalgias with Crestor and atorvastatin.  He is now on pravastatin 20 mg daily => plan to start [place] soon.   4. PAD: Occluded peroneal arteries bilaterally.  No significant claudication.  5. Diabetes type II: Unable to get empagliflozin.   Followup in 1 month.    Loralie Champagne  12/23/2016

## 2016-12-23 NOTE — Telephone Encounter (Signed)
Notes Recorded by Scarlette Calico, RN on 12/23/2016 at 3:51 PM EST Pt aware and agreeable

## 2016-12-23 NOTE — Progress Notes (Signed)
Patient present for medical management arm/Beat HF Study visit 3 months. States he is doing well since coronary stent placement. Continues to wear Life Vest and doing well with diet and medication compliance. No concerns or questions regarding study. Will follow up with Research in 3 months.

## 2016-12-23 NOTE — Telephone Encounter (Signed)
-----   Message from Larey Dresser, MD sent at 12/23/2016  3:11 PM EST ----- Stop KCl.

## 2016-12-23 NOTE — Patient Instructions (Signed)
Increase Losartan to 25 mg Twice daily   Labs today  Labs in 10 days  Your physician has requested that you have an echocardiogram. Echocardiography is a painless test that uses sound waves to create images of your heart. It provides your doctor with information about the size and shape of your heart and how well your heart's chambers and valves are working. This procedure takes approximately one hour. There are no restrictions for this procedure.  IN [place]  Your physician recommends that you schedule a follow-up appointment in: 1 month

## 2016-12-24 ENCOUNTER — Encounter (HOSPITAL_COMMUNITY)
Admission: RE | Admit: 2016-12-24 | Discharge: 2016-12-24 | Disposition: A | Discharge status: Home / Self Care | Payer: Medicare Other | Source: Ambulatory Visit | Attending: Cardiology | Admitting: Cardiology

## 2016-12-24 ENCOUNTER — Ambulatory Visit (HOSPITAL_COMMUNITY): Payer: Medicare Other

## 2016-12-24 DIAGNOSIS — I2510 Atherosclerotic heart disease of native coronary artery without angina pectoris: Secondary | ICD-10-CM | POA: Diagnosis not present

## 2016-12-24 DIAGNOSIS — I5042 Chronic combined systolic (congestive) and diastolic (congestive) heart failure: Secondary | ICD-10-CM | POA: Diagnosis not present

## 2016-12-24 DIAGNOSIS — I110 Hypertensive heart disease with heart failure: Secondary | ICD-10-CM | POA: Diagnosis not present

## 2016-12-24 DIAGNOSIS — Z955 Presence of coronary angioplasty implant and graft: Secondary | ICD-10-CM

## 2016-12-24 DIAGNOSIS — Z7982 Long term (current) use of aspirin: Secondary | ICD-10-CM | POA: Diagnosis not present

## 2016-12-24 DIAGNOSIS — Z79899 Other long term (current) drug therapy: Secondary | ICD-10-CM | POA: Diagnosis not present

## 2016-12-24 LAB — GLUCOSE, CAPILLARY: GLUCOSE-CAPILLARY: 135 mg/dL — AB (ref 65–99)

## 2016-12-24 NOTE — Progress Notes (Signed)
Daily Session Note  Patient Details  Name: Wayne [name] MRN: [id number] Date of Birth: [birth year] Referring Provider:   Flowsheet Row CARDIAC REHAB PHASE II ORIENTATION from 12/16/2016 in [place]  Referring Provider  Loralie Champagne, MD      Encounter Date: 12/24/2016  Check In:     Session Check In - 12/24/16 1147      Check-In   Location [facility]   Staff Present Cleda Mccreedy, MS, Exercise Physiologist;Maron Stanzione Wilber Oliphant, RN, BSN;Amber Fair, MS, ACSM RCEP, Exercise Physiologist;Joann Rion, RN, Deland Pretty, MS, ACSM CEP, Exercise Physiologist   Supervising physician immediately available to respond to emergencies [facility] immediately available   Physician(s) Dr. Eliseo Squires   Medication changes reported     No   Fall or balance concerns reported    No   Warm-up and Cool-down Performed as group-led instruction   Resistance Training Performed No   VAD Patient? No     Pain Assessment   Currently in Pain? No/denies   Multiple Pain Sites No      Capillary Blood Glucose: Results for orders placed or performed during the hospital encounter of 12/24/16 (from the past 24 hour(s))  Glucose, capillary     Status: Abnormal   Collection Time: 12/24/16 12:35 PM  Result Value Ref Range   Glucose-Capillary 135 (H) 65 - 99 mg/dL     Goals Met:  Exercise tolerated well Personal goals reviewed  Goals Unmet:  Not Applicable  Comments:  Pt returns today for the start of his full exercise in cardiac rehab.  Pt was in on Monday but opted not to exercise due to concerns that the rash on his torso may have been shingles.  Pt seen by primary MD and at the Heart Failure clinic and deemed non shingle rash and ok to participate in group exercise at cardiac rehab. Pt tolerated light exercise fair with some fatigue most noticeable on the track. Pt given cart to use during his walking to improve stability and strength. Pt  VSS,  telemetry- Sr, asymptomatic. Pt wears life vest.  Medication list reconciled. Pt denies barriers to medication compliance.  PSYCHOSOCIAL ASSESSMENT:  PHQ-0. Pt exhibits positive coping skills, hopeful outlook with supportive family. No psychosocial needs identified at this time, no psychosocial interventions necessary.  Pt enjoys fresh water fishing and drag racing. Pt desires to improve his walking tolerance and and improve his cardiovascular health.Will monitor pt progress toward achieving this goal.  Pt oriented to exercise equipment and routine.    Understanding verbalized. Maurice Small RN, BSN      Dr. Fransico Him is Medical Director for Cardiac Rehab at [facility].

## 2016-12-25 LAB — GLUCOSE, CAPILLARY: Glucose-Capillary: 188 mg/dL — ABNORMAL HIGH (ref 65–99)

## 2016-12-26 ENCOUNTER — Encounter (HOSPITAL_COMMUNITY)
Admission: RE | Admit: 2016-12-26 | Discharge: 2016-12-26 | Disposition: A | Discharge status: Home / Self Care | Payer: Medicare Other | Source: Ambulatory Visit | Attending: Cardiology | Admitting: Cardiology

## 2016-12-26 ENCOUNTER — Ambulatory Visit (HOSPITAL_COMMUNITY): Payer: Medicare Other

## 2016-12-26 DIAGNOSIS — Z955 Presence of coronary angioplasty implant and graft: Secondary | ICD-10-CM | POA: Diagnosis not present

## 2016-12-26 DIAGNOSIS — I5042 Chronic combined systolic (congestive) and diastolic (congestive) heart failure: Secondary | ICD-10-CM | POA: Diagnosis not present

## 2016-12-26 DIAGNOSIS — I110 Hypertensive heart disease with heart failure: Secondary | ICD-10-CM | POA: Diagnosis not present

## 2016-12-26 DIAGNOSIS — Z7982 Long term (current) use of aspirin: Secondary | ICD-10-CM | POA: Diagnosis not present

## 2016-12-26 DIAGNOSIS — I2510 Atherosclerotic heart disease of native coronary artery without angina pectoris: Secondary | ICD-10-CM | POA: Diagnosis not present

## 2016-12-26 DIAGNOSIS — Z79899 Other long term (current) drug therapy: Secondary | ICD-10-CM | POA: Diagnosis not present

## 2016-12-26 LAB — GLUCOSE, CAPILLARY
GLUCOSE-CAPILLARY: 173 mg/dL — AB (ref 65–99)
Glucose-Capillary: 134 mg/dL — ABNORMAL HIGH (ref 65–99)

## 2016-12-29 ENCOUNTER — Ambulatory Visit (HOSPITAL_COMMUNITY): Payer: Medicare Other

## 2016-12-29 ENCOUNTER — Encounter (HOSPITAL_COMMUNITY)
Admission: RE | Admit: 2016-12-29 | Discharge: 2016-12-29 | Disposition: A | Discharge status: Home / Self Care | Payer: Medicare Other | Source: Ambulatory Visit | Attending: Cardiology | Admitting: Cardiology

## 2016-12-29 DIAGNOSIS — I5042 Chronic combined systolic (congestive) and diastolic (congestive) heart failure: Secondary | ICD-10-CM | POA: Diagnosis not present

## 2016-12-29 DIAGNOSIS — I2510 Atherosclerotic heart disease of native coronary artery without angina pectoris: Secondary | ICD-10-CM | POA: Diagnosis not present

## 2016-12-29 DIAGNOSIS — Z955 Presence of coronary angioplasty implant and graft: Secondary | ICD-10-CM | POA: Diagnosis not present

## 2016-12-29 DIAGNOSIS — Z79899 Other long term (current) drug therapy: Secondary | ICD-10-CM | POA: Diagnosis not present

## 2016-12-29 DIAGNOSIS — I110 Hypertensive heart disease with heart failure: Secondary | ICD-10-CM | POA: Diagnosis not present

## 2016-12-29 DIAGNOSIS — Z7982 Long term (current) use of aspirin: Secondary | ICD-10-CM | POA: Diagnosis not present

## 2016-12-29 LAB — GLUCOSE, CAPILLARY: Glucose-Capillary: 168 mg/dL — ABNORMAL HIGH (ref 65–99)

## 2016-12-30 LAB — GLUCOSE, CAPILLARY: GLUCOSE-CAPILLARY: 240 mg/dL — AB (ref 65–99)

## 2016-12-31 ENCOUNTER — Ambulatory Visit (HOSPITAL_COMMUNITY): Payer: Medicare Other

## 2016-12-31 ENCOUNTER — Encounter (HOSPITAL_COMMUNITY): Payer: Medicare Other

## 2017-01-02 ENCOUNTER — Encounter (HOSPITAL_COMMUNITY)
Admission: RE | Admit: 2017-01-02 | Discharge: 2017-01-02 | Disposition: A | Discharge status: Home / Self Care | Payer: Medicare Other | Source: Ambulatory Visit | Attending: Cardiology | Admitting: Cardiology

## 2017-01-02 ENCOUNTER — Ambulatory Visit (HOSPITAL_COMMUNITY): Payer: Medicare Other

## 2017-01-02 ENCOUNTER — Ambulatory Visit (HOSPITAL_COMMUNITY)
Admission: RE | Admit: 2017-01-02 | Discharge: 2017-01-02 | Disposition: A | Discharge status: Home / Self Care | Payer: Medicare Other | Source: Ambulatory Visit | Attending: Cardiology | Admitting: Cardiology

## 2017-01-02 DIAGNOSIS — I110 Hypertensive heart disease with heart failure: Secondary | ICD-10-CM | POA: Diagnosis not present

## 2017-01-02 DIAGNOSIS — I5022 Chronic systolic (congestive) heart failure: Secondary | ICD-10-CM

## 2017-01-02 DIAGNOSIS — Z955 Presence of coronary angioplasty implant and graft: Secondary | ICD-10-CM

## 2017-01-02 DIAGNOSIS — I5042 Chronic combined systolic (congestive) and diastolic (congestive) heart failure: Secondary | ICD-10-CM | POA: Diagnosis not present

## 2017-01-02 DIAGNOSIS — Z7982 Long term (current) use of aspirin: Secondary | ICD-10-CM | POA: Diagnosis not present

## 2017-01-02 DIAGNOSIS — I2510 Atherosclerotic heart disease of native coronary artery without angina pectoris: Secondary | ICD-10-CM | POA: Diagnosis not present

## 2017-01-02 DIAGNOSIS — Z79899 Other long term (current) drug therapy: Secondary | ICD-10-CM | POA: Diagnosis not present

## 2017-01-02 LAB — BASIC METABOLIC PANEL
Anion gap: 10 (ref 5–15)
BUN: 25 mg/dL — AB (ref 6–20)
CALCIUM: 9.8 mg/dL (ref 8.9–10.3)
CHLORIDE: 101 mmol/L (ref 101–111)
CO2: 27 mmol/L (ref 22–32)
CREATININE: 1.32 mg/dL — AB (ref 0.61–1.24)
GFR calc Af Amer: >60 mL/min (ref >60)
GFR calc non Af Amer: 52 mL/min — ABNORMAL LOW (ref >60)
Glucose, Bld: 260 mg/dL — ABNORMAL HIGH (ref 65–99)
Potassium: 5 mmol/L (ref 3.5–5.1)
SODIUM: 138 mmol/L (ref 135–145)

## 2017-01-05 ENCOUNTER — Encounter (HOSPITAL_COMMUNITY)
Admission: RE | Admit: 2017-01-05 | Discharge: 2017-01-05 | Disposition: A | Discharge status: Home / Self Care | Payer: Medicare Other | Source: Ambulatory Visit | Attending: Cardiology | Admitting: Cardiology

## 2017-01-05 ENCOUNTER — Ambulatory Visit (HOSPITAL_COMMUNITY): Payer: Medicare Other

## 2017-01-05 DIAGNOSIS — I2510 Atherosclerotic heart disease of native coronary artery without angina pectoris: Secondary | ICD-10-CM | POA: Diagnosis not present

## 2017-01-05 DIAGNOSIS — Z955 Presence of coronary angioplasty implant and graft: Secondary | ICD-10-CM | POA: Diagnosis not present

## 2017-01-05 DIAGNOSIS — I5042 Chronic combined systolic (congestive) and diastolic (congestive) heart failure: Secondary | ICD-10-CM | POA: Diagnosis not present

## 2017-01-05 DIAGNOSIS — I110 Hypertensive heart disease with heart failure: Secondary | ICD-10-CM | POA: Diagnosis not present

## 2017-01-05 DIAGNOSIS — Z7982 Long term (current) use of aspirin: Secondary | ICD-10-CM | POA: Diagnosis not present

## 2017-01-05 DIAGNOSIS — Z79899 Other long term (current) drug therapy: Secondary | ICD-10-CM | POA: Diagnosis not present

## 2017-01-06 ENCOUNTER — Telehealth (HOSPITAL_COMMUNITY): Payer: Self-pay | Admitting: Pharmacist

## 2017-01-06 ENCOUNTER — Telehealth (HOSPITAL_COMMUNITY): Payer: Self-pay | Admitting: *Deleted

## 2017-01-06 NOTE — Telephone Encounter (Signed)
[place] approved patient assistance for Brilinta. He will be sent 90 day supplies of his Brilinta at no cost to him through 12/03/17.   Ruta Hinds. Velva Harman, PharmD, BCPS, CPP Clinical Pharmacist Pager: [phone] Phone: [phone] 01/06/2017 11:05 AM

## 2017-01-06 NOTE — Telephone Encounter (Signed)
Patient called in asking for [place] to call him.  He did not state a reason.  I called patient back but had to leave a VM.

## 2017-01-07 ENCOUNTER — Ambulatory Visit (HOSPITAL_COMMUNITY): Payer: Medicare Other

## 2017-01-07 ENCOUNTER — Encounter (HOSPITAL_COMMUNITY)
Admission: RE | Admit: 2017-01-07 | Discharge: 2017-01-07 | Disposition: A | Discharge status: Home / Self Care | Payer: Medicare Other | Source: Ambulatory Visit | Attending: Cardiology | Admitting: Cardiology

## 2017-01-07 DIAGNOSIS — Z955 Presence of coronary angioplasty implant and graft: Secondary | ICD-10-CM | POA: Diagnosis not present

## 2017-01-07 DIAGNOSIS — I110 Hypertensive heart disease with heart failure: Secondary | ICD-10-CM | POA: Diagnosis not present

## 2017-01-07 DIAGNOSIS — I5042 Chronic combined systolic (congestive) and diastolic (congestive) heart failure: Secondary | ICD-10-CM | POA: Diagnosis not present

## 2017-01-07 DIAGNOSIS — I2510 Atherosclerotic heart disease of native coronary artery without angina pectoris: Secondary | ICD-10-CM | POA: Diagnosis not present

## 2017-01-07 DIAGNOSIS — Z79899 Other long term (current) drug therapy: Secondary | ICD-10-CM | POA: Diagnosis not present

## 2017-01-07 DIAGNOSIS — Z7982 Long term (current) use of aspirin: Secondary | ICD-10-CM | POA: Diagnosis not present

## 2017-01-09 ENCOUNTER — Encounter (HOSPITAL_COMMUNITY)
Admission: RE | Admit: 2017-01-09 | Discharge: 2017-01-09 | Disposition: A | Discharge status: Home / Self Care | Payer: Medicare Other | Source: Ambulatory Visit | Attending: Cardiology | Admitting: Cardiology

## 2017-01-09 ENCOUNTER — Ambulatory Visit (HOSPITAL_COMMUNITY): Payer: Medicare Other

## 2017-01-09 DIAGNOSIS — I5042 Chronic combined systolic (congestive) and diastolic (congestive) heart failure: Secondary | ICD-10-CM | POA: Diagnosis not present

## 2017-01-09 DIAGNOSIS — Z7982 Long term (current) use of aspirin: Secondary | ICD-10-CM | POA: Diagnosis not present

## 2017-01-09 DIAGNOSIS — Z955 Presence of coronary angioplasty implant and graft: Secondary | ICD-10-CM

## 2017-01-09 DIAGNOSIS — I2510 Atherosclerotic heart disease of native coronary artery without angina pectoris: Secondary | ICD-10-CM | POA: Diagnosis not present

## 2017-01-09 DIAGNOSIS — Z79899 Other long term (current) drug therapy: Secondary | ICD-10-CM | POA: Diagnosis not present

## 2017-01-09 DIAGNOSIS — I110 Hypertensive heart disease with heart failure: Secondary | ICD-10-CM | POA: Diagnosis not present

## 2017-01-12 ENCOUNTER — Ambulatory Visit (HOSPITAL_COMMUNITY): Payer: Medicare Other

## 2017-01-12 ENCOUNTER — Encounter (HOSPITAL_COMMUNITY)
Admission: RE | Admit: 2017-01-12 | Discharge: 2017-01-12 | Disposition: A | Discharge status: Home / Self Care | Payer: Medicare Other | Source: Ambulatory Visit | Attending: Cardiology | Admitting: Cardiology

## 2017-01-12 ENCOUNTER — Telehealth (HOSPITAL_COMMUNITY): Payer: Self-pay | Admitting: Pharmacist

## 2017-01-12 DIAGNOSIS — I110 Hypertensive heart disease with heart failure: Secondary | ICD-10-CM | POA: Diagnosis not present

## 2017-01-12 DIAGNOSIS — Z955 Presence of coronary angioplasty implant and graft: Secondary | ICD-10-CM | POA: Diagnosis not present

## 2017-01-12 DIAGNOSIS — I2510 Atherosclerotic heart disease of native coronary artery without angina pectoris: Secondary | ICD-10-CM | POA: Diagnosis not present

## 2017-01-12 DIAGNOSIS — I5042 Chronic combined systolic (congestive) and diastolic (congestive) heart failure: Secondary | ICD-10-CM | POA: Diagnosis not present

## 2017-01-12 DIAGNOSIS — Z7982 Long term (current) use of aspirin: Secondary | ICD-10-CM | POA: Diagnosis not present

## 2017-01-12 DIAGNOSIS — Z79899 Other long term (current) drug therapy: Secondary | ICD-10-CM | POA: Diagnosis not present

## 2017-01-12 NOTE — Telephone Encounter (Signed)
Mr. Laske called stating that since starting cardiac rehab his hip has been hurting and he's wondering if the pravastatin could be causing this. Since he started pravastatin months before this and he has a h/o bursitis in his hip, it is likely not being caused by the pravastatin. He would like to stop the pravastatin for a few days to see if the pain gets better but I told him to restart it if the pain did not subside. I have also recommended making an appointment with his PCP for further work up of the hip pain.   Ruta Hinds. Velva Harman, PharmD, BCPS, CPP Clinical Pharmacist Pager: [phone] Phone: [phone] 01/12/2017 9:19 AM

## 2017-01-13 NOTE — Progress Notes (Signed)
Reviewed home exercise with pt today.  Pt plans to walk for exercise, 2x/week in addition to coming to cardiac rehab.  Reviewed THR, pulse, RPE, sign and symptoms, NTG use, and when to call 911 or MD.  Also discussed weather considerations and indoor options.  Pt voiced understanding.    Dailynn Nancarrow,MS,ACSM RCEP 

## 2017-01-14 ENCOUNTER — Ambulatory Visit (HOSPITAL_COMMUNITY): Payer: Medicare Other

## 2017-01-14 ENCOUNTER — Encounter (HOSPITAL_COMMUNITY)
Admission: RE | Admit: 2017-01-14 | Discharge: 2017-01-14 | Disposition: A | Discharge status: Home / Self Care | Payer: Medicare Other | Source: Ambulatory Visit | Attending: Cardiology | Admitting: Cardiology

## 2017-01-14 DIAGNOSIS — Z955 Presence of coronary angioplasty implant and graft: Secondary | ICD-10-CM

## 2017-01-15 NOTE — Progress Notes (Signed)
Cardiac Individual Treatment Plan  Patient Details  Name: Wayne [name] MRN: [id number] Date of Birth: 1941-06-19 Referring Provider:   Flowsheet Row CARDIAC REHAB PHASE II ORIENTATION from 12/16/2016 in [place]  Referring Provider  Loralie Champagne, MD      Initial Encounter Date:  [place] PHASE II ORIENTATION from 12/16/2016 in [facility]  Date  12/16/16  Referring Provider  Loralie Champagne, MD      Visit Diagnosis: 11/17/16 S/P coronary artery stent placement  Patient's Home Medications on Admission:  Current Outpatient Prescriptions:  .  acetaminophen (TYLENOL) 500 MG tablet, Take 500 mg by mouth daily as needed for moderate pain or headache., Disp: , Rfl:  .  aspirin EC 81 MG tablet, Take 81 mg by mouth daily., Disp: , Rfl:  .  carvedilol (COREG) 3.125 MG tablet, Take 1 tablet (3.125 mg total) by mouth 2 (two) times daily., Disp: 60 tablet, Rfl: 3 .  digoxin (LANOXIN) 0.125 MG tablet, Take 1 tablet (0.125 mg total) by mouth daily., Disp: 30 tablet, Rfl: 6 .  diphenhydramine-acetaminophen (TYLENOL PM) 25-500 MG TABS tablet, Take 1 tablet by mouth at bedtime as needed (sleep)., Disp: , Rfl:  .  furosemide (LASIX) 80 MG tablet, Take 1 tablet (80 mg total) by mouth 2 (two) times daily. (Patient taking differently: Take 80 mg by mouth 2 (two) times daily. Pt instructed on 12/23/16 to continue 80 mg in the morning and cut back to 60 mg for the evening dose.), Disp: 60 tablet, Rfl: 3 .  glimepiride (AMARYL) 2 MG tablet, Take 1 tablet (2 mg total) by mouth daily before breakfast., Disp: 30 tablet, Rfl: 3 .  glucose blood (ACCU-CHEK AVIVA) test strip, Check blood sugars 1-2 times per day. DX: E11.65, Disp: 200 each, Rfl: 5 .  Lancets (ACCU-CHEK SOFT TOUCH) lancets, Check blood sugars 1-2 times per day. DX: E11.65, Disp: 200 each, Rfl: 5 .  losartan (COZAAR) 25 MG tablet, Take 1 tablet (25 mg total) by mouth 2 (two)  times daily., Disp: 60 tablet, Rfl: 6 .  Multiple Vitamins-Minerals (CENTRUM SILVER ADULT 50+) TABS, Take 1 tablet by mouth daily., Disp: , Rfl:  .  potassium chloride SA (K-DUR,KLOR-CON) 20 MEQ tablet, Take 2 tablets (40 mEq total) by mouth daily. (Patient not taking: Reported on 12/24/2016), Disp: 60 tablet, Rfl: 6 .  pravastatin (PRAVACHOL) 20 MG tablet, Take 1 tablet (20 mg total) by mouth every evening., Disp: 30 tablet, Rfl: 11 .  spironolactone (ALDACTONE) 25 MG tablet, Take 1 tablet (25 mg total) by mouth daily., Disp: 30 tablet, Rfl: 6 .  ticagrelor (BRILINTA) 90 MG TABS tablet, Take 1 tablet (90 mg total) by mouth 2 (two) times daily., Disp: 60 tablet, Rfl: 6  Past Medical History: Past Medical History:  Diagnosis Date  . Adenocarcinoma of prostate ([place])    s/p seed implants  . Arthritis   . Cellulitis of left leg    a. 0000000 complicated by septic shock  . Chronic combined systolic and diastolic CHF (congestive heart failure) ([place])   . Colon polyps   . CORONARY ATHEROSCLEROSIS NATIVE CORONARY ARTERY    a. 01/2011 Cath/PCI: LM nl, LAD 40-50p, D1 80-small, LCX 95-small, RI 90, RCA 100, EF 20%;  b. 01/2011 Card MRI - No transmural scar;  c. 01/2011 PCI RCA->5 Promus DES, RI->3.0x16 Promus DES; d. Cath 01/13/13 patent LAD & Ramus, diffuse LCx dz, RCA mult overlapping stents w/ 95% osital  stenosis, EF 20%, s/p DES to ostial/prox RCA 01/24/13   . Diabetes mellitus, type 2 ([place])   . GERD (gastroesophageal reflux disease)   . Hematoma of leg    a. left leg hematoma 03/2012 in the setting of asa/effient  . Herpes zoster ophthalmicus   . HYPERLIPIDEMIA    intolerant to Lipitor (myalgias)  . HYPERTENSION   . Ischemic cardiomyopathy    a. 01/2012 Echo EF 45%, mild LVH; b. [id number], grade 1 diastolic dysfunction, diffuse hypokinesis, inferoposterior akinesis   . Noncompliance   . Obesity   . OSA (obstructive sleep apnea)   . Polymyalgia rheumatica ([facility])     Tobacco Use: History  Smoking  Status  . Former Smoker  . Packs/day: 0.30  . Years: 20.00  . Types: Cigarettes  . Quit date: 02/23/1989  Smokeless Tobacco  . Never Used    Labs: Recent Review Flowsheet Data    Labs for ITP Cardiac and Pulmonary Rehab Latest Ref Rng & Units 07/14/2016 10/06/2016 11/14/2016 11/14/2016 12/23/2016   Cholestrol 0 - 200 mg/dL - 146 - - 189   LDLCALC 0 - 99 mg/dL - 87 - - 104(H)   LDLDIRECT mg/dL - - - - -   HDL >40 mg/dL - 32(L) - - 33(L)   Trlycerides <150 mg/dL - 135 - - 259(H)   Hemoglobin A1c 4.6 - 6.5 % 8.7(H) - - - -   PHART 7.350 - 7.450 - - - - -   PCO2ART 35.0 - 45.0 mmHg - - - - -   HCO3 20.0 - 28.0 mmol/L - - 24.6 24.6 -   TCO2 0 - 100 mmol/L - - 26 26 -   ACIDBASEDEF 0.0 - 2.0 mmol/L - - 1.0 1.0 -   O2SAT % - - 57.0 57.0 -      Capillary Blood Glucose: Lab Results  Component Value Date   GLUCAP 168 (H) 12/29/2016   GLUCAP 240 (H) 12/29/2016   GLUCAP 134 (H) 12/26/2016   GLUCAP 173 (H) 12/26/2016   GLUCAP 135 (H) 12/24/2016     Exercise Target Goals:    Exercise Program Goal: Individual exercise prescription set with THRR, safety & activity barriers. Participant demonstrates ability to understand and report RPE using BORG scale, to self-measure pulse accurately, and to acknowledge the importance of the exercise prescription.  Exercise Prescription Goal: Starting with aerobic activity 30 plus minutes a day, 3 days per week for initial exercise prescription. Provide home exercise prescription and guidelines that participant acknowledges understanding prior to discharge.  Activity Barriers & Risk Stratification:     Activity Barriers & Cardiac Risk Stratification - 12/16/16 1616      Activity Barriers & Cardiac Risk Stratification   Cardiac Risk Stratification High      6 Minute Walk:     6 Minute Walk    Row Name 12/16/16 1616         6 Minute Walk   Phase Initial     Distance 1175 feet     Walk Time 6 minutes     # of Rest Breaks 0     MPH 2.2      METS 2.35     RPE 13     VO2 Peak 8.24     Symptoms Yes (comment)     Comments 5/10 L hip/knee pain     Resting HR 98 bpm     Resting BP 108/64     Max Ex. HR 124 bpm  Max Ex. BP 134/62     2 Minute Post BP 106/60        Initial Exercise Prescription:     Initial Exercise Prescription - 12/16/16 1600      Date of Initial Exercise RX and Referring Provider   Date 12/16/16   Referring Provider Loralie Champagne, MD     Recumbant Bike   Level 2   Minutes 10   METs 1.2     NuStep   Level 2   Minutes 10   METs 1.5     Track   Laps 8   Minutes 10   METs 2.39     Prescription Details   Frequency (times per week) 3   Duration Progress to 30 minutes of continuous aerobic without signs/symptoms of physical distress     Intensity   THRR 40-80% of Max Heartrate 59-118   Ratings of Perceived Exertion 11-13   Perceived Dyspnea 0-4     Progression   Progression Continue progressive overload as per policy without signs/symptoms or physical distress.     Resistance Training   Training Prescription Yes   Weight 2lbs   Reps 10-12      Perform Capillary Blood Glucose checks as needed.  Exercise Prescription Changes:     Exercise Prescription Changes    Row Name 01/13/17 1500             Exercise Review   Progression Yes         Response to Exercise   Blood Pressure (Admit) 118/62       Blood Pressure (Exercise) 120/62       Blood Pressure (Exit) 104/60       Heart Rate (Admit) 97 bpm       Heart Rate (Exercise) 105 bpm       Heart Rate (Exit) 96 bpm       Rating of Perceived Exertion (Exercise) 12       Symptoms none       Comments Reviewed HEP on 01/07/17       Duration Progress to 30 minutes of continuous aerobic without signs/symptoms of physical distress       Intensity THRR unchanged         Progression   Average METs 2.4         Resistance Training   Training Prescription Yes       Weight 5lbs       Reps 10-12         Recumbant Bike    Level 2       Minutes 10       METs 2.1         NuStep   Level 3       Minutes 10       METs 2.7         Arm Ergometer   Level 1       Watts 25       Minutes 10       METs 2.25         Home Exercise Plan   Plans to continue exercise at Home  Reviewed HEP on 01/07/17 see progress note       Frequency Add 2 additional days to program exercise sessions.          Exercise Comments:     Exercise Comments    Row Name 01/13/17 1534           Exercise Comments Reviewed METs  and goals. Pt is tolerating exercise well; will continue to monitor exercise progression.          Discharge Exercise Prescription (Final Exercise Prescription Changes):     Exercise Prescription Changes - 01/13/17 1500      Exercise Review   Progression Yes     Response to Exercise   Blood Pressure (Admit) 118/62   Blood Pressure (Exercise) 120/62   Blood Pressure (Exit) 104/60   Heart Rate (Admit) 97 bpm   Heart Rate (Exercise) 105 bpm   Heart Rate (Exit) 96 bpm   Rating of Perceived Exertion (Exercise) 12   Symptoms none   Comments Reviewed HEP on 01/07/17   Duration Progress to 30 minutes of continuous aerobic without signs/symptoms of physical distress   Intensity THRR unchanged     Progression   Average METs 2.4     Resistance Training   Training Prescription Yes   Weight 5lbs   Reps 10-12     Recumbant Bike   Level 2   Minutes 10   METs 2.1     NuStep   Level 3   Minutes 10   METs 2.7     Arm Ergometer   Level 1   Watts 25   Minutes 10   METs 2.25     Home Exercise Plan   Plans to continue exercise at Home  Reviewed HEP on 01/07/17 see progress note   Frequency Add 2 additional days to program exercise sessions.      Nutrition:  Target Goals: Understanding of nutrition guidelines, daily intake of sodium 1500mg , cholesterol 200mg , calories 30% from fat and 7% or less from saturated fats, daily to have 5 or more servings of fruits and vegetables.  Biometrics:      Pre Biometrics - 12/16/16 1619      Pre Biometrics   Height 5' 7.5" (1.715 m)   Weight 225 lb 15.5 oz (102.5 kg)   Waist Circumference 46 inches   Hip Circumference 46 inches   Waist to Hip Ratio 1 %   BMI (Calculated) 34.9   Triceps Skinfold 12 mm   % Body Fat 32.4 %   Grip Strength 42 kg   Flexibility 18 in   Single Leg Stand 18 seconds       Nutrition Therapy Plan and Nutrition Goals:   Nutrition Discharge: Nutrition Scores:   Nutrition Goals Re-Evaluation:   Psychosocial: Target Goals: Acknowledge presence or absence of depression, maximize coping skills, provide positive support system. Participant is able to verbalize types and ability to use techniques and skills needed for reducing stress and depression.  Initial Review & Psychosocial Screening:     Initial Psych Review & Screening - 12/18/16 0935      Family Dynamics   Good Support System? Yes   Comments A brief assesment reveals no further psychosocial needed at this time.     Barriers   Psychosocial barriers to participate in program There are no identifiable barriers or psychosocial needs.     Screening Interventions   Interventions Encouraged to exercise      Quality of Life Scores:     Quality of Life - 12/16/16 1357      Quality of Life Scores   Health/Function Pre 17.23 %   Socioeconomic Pre 21.5 %   Psych/Spiritual Pre 27 %   Family Pre 20.63 %   GLOBAL Pre 20.5 %      PHQ-9: Recent Review Flowsheet Data    Depression screen PHQ  2/9 12/24/2016 09/30/2016   Decreased Interest 0 0   Down, Depressed, Hopeless 0 0   PHQ - 2 Score 0 0      Psychosocial Evaluation and Intervention:     Psychosocial Evaluation - 01/15/17 1542      Psychosocial Evaluation & Interventions   Interventions Encouraged to exercise with the program and follow exercise prescription;Stress management education;Relaxation education   Continued Psychosocial Services Needed No      Psychosocial  Re-Evaluation:     Psychosocial Re-Evaluation    [place] Name 01/15/17 1542             Psychosocial Re-Evaluation   Interventions Encouraged to attend Cardiac Rehabilitation for the exercise;Relaxation education;Stress management education       Comments Pt with good support system at home.          Vocational Rehabilitation: Provide vocational rehab assistance to qualifying candidates.   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 Participant will state understanding/return demonstration of topics presented.  Learning Barriers/Preferences:     Learning Barriers/Preferences - 12/16/16 1455      Learning Barriers/Preferences   Learning Barriers Sight   Learning Preferences Verbal Instruction;Written Material;Skilled Demonstration;Individual Instruction;Group Instruction      Education Topics: Count Your Pulse:  -Group instruction provided by verbal instruction, demonstration, patient participation and written materials to support subject.  Instructors address importance of being able to find your pulse and how to count your pulse when at home without a heart monitor.  Patients get hands on experience counting their pulse with staff help and individually.   Heart Attack, Angina, and Risk Factor Modification:  -Group instruction provided by verbal instruction, video, and written materials to support subject.  Instructors address signs and symptoms of angina and heart attacks.    Also discuss risk factors for heart disease and how to make changes to improve heart health risk factors. Flowsheet Row CARDIAC REHAB PHASE II EXERCISE from 01/14/2017 in [facility]  Date  01/07/17  Instruction Review Code  2- meets goals/outcomes      Functional Fitness:  -Group instruction provided by verbal instruction, demonstration, patient participation, and written materials to support subject.  Instructors address safety measures for doing things around the house.  Discuss how to get up and down off the floor, how to pick things up properly, how to safely get out of a chair without assistance, and balance training.   Meditation and Mindfulness:  -Group instruction provided by verbal instruction, patient participation, and written materials to support subject.  Instructor addresses importance of mindfulness and meditation practice to help reduce stress and improve awareness.  Instructor also leads participants through a meditation exercise.    Stretching for Flexibility and Mobility:  -Group instruction provided by verbal instruction, patient participation, and written materials to support subject.  Instructors lead participants through series of stretches that are designed to increase flexibility thus improving mobility.  These stretches are additional exercise for major muscle groups that are typically performed during regular warm up and cool down. Flowsheet Row CARDIAC REHAB PHASE II EXERCISE from 01/14/2017 in [facility]  Date  01/09/17  Instruction Review Code  2- meets goals/outcomes      Hands Only CPR Anytime:  -Group instruction provided by verbal instruction, video, patient participation and written materials to support subject.  Instructors co-teach with AHA video for hands only CPR.  Participants get hands on experience with mannequins.   Nutrition I class: Heart Healthy Eating:  -Group instruction provided by PowerPoint slides, verbal discussion, and written materials to support subject matter. The instructor gives an explanation and review of the Therapeutic Lifestyle Changes diet recommendations,  which includes a discussion on lipid goals, dietary fat, sodium, fiber, plant stanol/sterol esters, sugar, and the components of a well-balanced, healthy diet.   Nutrition II class: Lifestyle Skills:  -Group instruction provided by PowerPoint slides, verbal discussion, and written materials to support subject matter. The instructor gives an explanation and review of label reading, grocery shopping for heart health, heart healthy recipe modifications, and ways to make healthier choices when eating out.   Diabetes Question & Answer:  -Group instruction provided by PowerPoint slides, verbal discussion, and written materials to support subject matter. The instructor gives an explanation and review of diabetes co-morbidities, pre- and post-prandial blood glucose goals, pre-exercise blood glucose goals, signs, symptoms, and treatment of hypoglycemia and hyperglycemia, and foot care basics. Flowsheet Row CARDIAC REHAB PHASE II EXERCISE from 01/14/2017 in [facility]  Date  12/26/16  Educator  RD  Instruction Review Code  2- meets goals/outcomes      Diabetes Blitz:  -Group instruction provided by PowerPoint slides, verbal discussion, and written materials to support subject matter. The instructor gives an explanation and review of the physiology behind type 1 and type 2 diabetes, diabetes medications and rational behind using different medications, pre- and post-prandial blood glucose recommendations and Hemoglobin A1c goals, diabetes diet, and exercise including blood glucose guidelines for exercising safely.    Portion Distortion:  -Group instruction provided by PowerPoint slides, verbal discussion, written materials, and food models to support subject matter. The instructor gives an explanation of serving size versus portion size, changes in portions sizes over the last 20 years, and what consists of a serving from each food group.   Stress Management:  -Group  instruction provided by verbal instruction, video, and written materials to support subject matter.  Instructors review role of stress in heart disease and how to cope with stress positively.   Flowsheet Row CARDIAC REHAB PHASE II EXERCISE from 01/14/2017 in [facility]  Date  01/02/17  Instruction Review Code  2- meets goals/outcomes      Exercising on Your Own:  -Group instruction provided by verbal instruction, power point, and written materials to support subject.  Instructors discuss benefits of exercise, components of exercise, frequency and intensity of exercise, and end points for exercise.  Also discuss use of nitroglycerin and activating EMS.  Review options of places to exercise outside of rehab.  Review guidelines for sex with heart disease. Flowsheet Row CARDIAC REHAB PHASE II EXERCISE from 01/14/2017 in [facility]  Date  01/14/17  Educator  [place] EP  Instruction Review Code  2- meets goals/outcomes      Cardiac Drugs I:  -Group instruction provided by verbal instruction and written materials to support subject.  Instructor reviews cardiac drug classes: antiplatelets, anticoagulants, beta blockers, and statins.  Instructor discusses reasons, side effects, and lifestyle considerations for each drug class.   Cardiac Drugs II:  -Group instruction provided by verbal instruction and  written materials to support subject.  Instructor reviews cardiac drug classes: angiotensin converting enzyme inhibitors (ACE-I), angiotensin II receptor blockers (ARBs), nitrates, and calcium channel blockers.  Instructor discusses reasons, side effects, and lifestyle considerations for each drug class. Flowsheet Row CARDIAC REHAB PHASE II EXERCISE from 01/14/2017 in [facility]  Date  12/24/16  Educator  Pharmacist  Instruction Review Code  2- meets goals/outcomes      Anatomy and Physiology of the  Circulatory System:  -Group instruction provided by verbal instruction, video, and written materials to support subject.  Reviews functional anatomy of heart, how it relates to various diagnoses, and what role the heart plays in the overall system.   Knowledge Questionnaire Score:     Knowledge Questionnaire Score - 12/16/16 1357      Knowledge Questionnaire Score   Pre Score 19/24      Core Components/Risk Factors/Patient Goals at Admission:     Personal Goals and Risk Factors at Admission - 12/16/16 1458      Core Components/Risk Factors/Patient Goals on Admission    Weight Management Yes;Weight Loss;Obesity;Weight Maintenance   Intervention Weight Management: Develop a combined nutrition and exercise program designed to reach desired caloric intake, while maintaining appropriate intake of nutrient and fiber, sodium and fats, and appropriate energy expenditure required for the weight goal.;Weight Management: Provide education and appropriate resources to help participant work on and attain dietary goals.;Weight Management/Obesity: Establish reasonable short term and long term weight goals.;Obesity: Provide education and appropriate resources to help participant work on and attain dietary goals.   Expected Outcomes Short Term: Continue to assess and modify interventions until short term weight is achieved;Long Term: Adherence to nutrition and physical activity/exercise program aimed toward attainment of established weight goal;Weight Maintenance: Understanding of the daily nutrition guidelines, which includes 25-35% calories from fat, 7% or less cal from saturated fats, less than 200mg  cholesterol, less than 1.5gm of sodium, & 5 or more servings of fruits and vegetables daily;Weight Loss: Understanding of general recommendations for a balanced deficit meal plan, which promotes 1-2 lb weight loss per week and includes a negative energy balance of [phone] kcal/d;Understanding recommendations  for meals to include 15-35% energy as protein, 25-35% energy from fat, 35-60% energy from carbohydrates, less than 200mg  of dietary cholesterol, 20-35 gm of total fiber daily;Understanding of distribution of calorie intake throughout the day with the consumption of 4-5 meals/snacks   Sedentary Yes   Intervention Provide advice, education, support and counseling about physical activity/exercise needs.;Develop an individualized exercise prescription for aerobic and resistive training based on initial evaluation findings, risk stratification, comorbidities and participant's personal goals.   Expected Outcomes Achievement of increased cardiorespiratory fitness and enhanced flexibility, muscular endurance and strength shown through measurements of functional capacity and personal statement of participant.   Increase Strength and Stamina Yes   Intervention Provide advice, education, support and counseling about physical activity/exercise needs.;Develop an individualized exercise prescription for aerobic and resistive training based on initial evaluation findings, risk stratification, comorbidities and participant's personal goals.   Expected Outcomes Achievement of increased cardiorespiratory fitness and enhanced flexibility, muscular endurance and strength shown through measurements of functional capacity and personal statement of participant.   Improve shortness of breath with ADL's Yes   Intervention Provide education, individualized exercise plan and daily activity instruction to help decrease symptoms of SOB with activities of daily living.   Expected Outcomes Short Term: Achieves a reduction of symptoms when performing activities of daily living.   Diabetes Yes  Intervention Provide education about signs/symptoms and action to take for hypo/hyperglycemia.;Provide education about proper nutrition, including hydration, and aerobic/resistive exercise prescription along with prescribed medications to achieve  blood glucose in normal ranges: Fasting glucose 65-99 mg/dL   Expected Outcomes Short Term: Participant verbalizes understanding of the signs/symptoms and immediate care of hyper/hypoglycemia, proper foot care and importance of medication, aerobic/resistive exercise and nutrition plan for blood glucose control.;Long Term: Attainment of HbA1C < 7%.   Hypertension Yes   Intervention Provide education on lifestyle modifcations including regular physical activity/exercise, weight management, moderate sodium restriction and increased consumption of fresh fruit, vegetables, and low fat dairy, alcohol moderation, and smoking cessation.;Monitor prescription use compliance.   Expected Outcomes Short Term: Continued assessment and intervention until BP is < 140/56mm HG in hypertensive participants. < 130/2mm HG in hypertensive participants with diabetes, heart failure or chronic kidney disease.;Long Term: Maintenance of blood pressure at goal levels.   Lipids Yes   Intervention Provide education and support for participant on nutrition & aerobic/resistive exercise along with prescribed medications to achieve LDL 70mg , HDL >40mg .   Expected Outcomes Short Term: Participant states understanding of desired cholesterol values and is compliant with medications prescribed. Participant is following exercise prescription and nutrition guidelines.;Long Term: Cholesterol controlled with medications as prescribed, with individualized exercise RX and with personalized nutrition plan. Value goals: LDL < 70mg , HDL > 40 mg.   Personal Goal Other Yes   Personal Goal Improve walking tolerance and overall CV health.   Intervention Provide exercise programming to assist with improving exercise capacity and provide cardiac education classes to increase awareness and knowledge   Expected Outcomes Pt will be able to walk with little to no symptoms of leg pain. Pt will improve overall quality of life      Core Components/Risk  Factors/Patient Goals Review:      Goals and Risk Factor Review    Row Name 01/13/17 1535             Core Components/Risk Factors/Patient Goals Review   Personal Goals Review Other       Review Pt is walking 2x/week at home for exercise without difficulty       Expected Outcomes Pt will improve in exercise tolerance and overall fitness levels.          Core Components/Risk Factors/Patient Goals at Discharge (Final Review):      Goals and Risk Factor Review - 01/13/17 1535      Core Components/Risk Factors/Patient Goals Review   Personal Goals Review Other   Review Pt is walking 2x/week at home for exercise without difficulty   Expected Outcomes Pt will improve in exercise tolerance and overall fitness levels.      ITP Comments:     ITP Comments    Row Name 12/16/16 1339           ITP Comments Dr Sherle Poe, Medical Director          Comments:  Pt is making expected progress toward personal goals after completing  10 sessions. Pt is doing well.  Currently wears life vest and will have his repeat echo in March. Recommend continued exercise and life style modification education including  stress management and relaxation techniques to decrease cardiac risk profile. Cherre Huger, BSN

## 2017-01-15 NOTE — Progress Notes (Signed)
QUALITY OF LIFE SCORE REVIEW  Pt completed Quality of Life survey as a participant in Cardiac Rehab. Scores 21.0 or below are considered low.      Quality of Life - 12/16/16 1357      Quality of Life Scores   Health/Function Pre 17.23 %   Socioeconomic Pre 21.5 %   Psych/Spiritual Pre 27 %   Family Pre 20.63 %   GLOBAL Pre 20.5 %      Pt reports feeling uneasy due to not knowing if he will need to have a ICD placed. Pt has on life vest and will have a repeat echo in March.  Pt has supportive wife and is doing well with exercise. Offered emotional support and reassurance.  Will continue to monitor and intervene as necessary.

## 2017-01-16 ENCOUNTER — Emergency Department (HOSPITAL_COMMUNITY): Payer: Medicare Other

## 2017-01-16 ENCOUNTER — Inpatient Hospital Stay (HOSPITAL_COMMUNITY)
Admission: EM | Admit: 2017-01-16 | Discharge: 2017-01-17 | DRG: 309 | Disposition: A | Discharge status: Home / Self Care | Payer: Medicare Other | Attending: Cardiology | Admitting: Cardiology

## 2017-01-16 ENCOUNTER — Other Ambulatory Visit: Payer: Self-pay

## 2017-01-16 ENCOUNTER — Ambulatory Visit (HOSPITAL_COMMUNITY): Payer: Medicare Other

## 2017-01-16 ENCOUNTER — Encounter (HOSPITAL_COMMUNITY)
Admission: RE | Admit: 2017-01-16 | Discharge: 2017-01-16 | Disposition: A | Discharge status: Home / Self Care | Payer: Medicare Other | Source: Ambulatory Visit | Attending: Cardiology | Admitting: Cardiology

## 2017-01-16 ENCOUNTER — Encounter (HOSPITAL_COMMUNITY): Payer: Self-pay | Admitting: *Deleted

## 2017-01-16 DIAGNOSIS — Z955 Presence of coronary angioplasty implant and graft: Secondary | ICD-10-CM

## 2017-01-16 DIAGNOSIS — N183 Chronic kidney disease, stage 3 unspecified: Secondary | ICD-10-CM | POA: Diagnosis present

## 2017-01-16 DIAGNOSIS — Z87891 Personal history of nicotine dependence: Secondary | ICD-10-CM | POA: Insufficient documentation

## 2017-01-16 DIAGNOSIS — I5022 Chronic systolic (congestive) heart failure: Secondary | ICD-10-CM

## 2017-01-16 DIAGNOSIS — I4891 Unspecified atrial fibrillation: Secondary | ICD-10-CM

## 2017-01-16 DIAGNOSIS — Z8546 Personal history of malignant neoplasm of prostate: Secondary | ICD-10-CM | POA: Insufficient documentation

## 2017-01-16 DIAGNOSIS — Z8249 Family history of ischemic heart disease and other diseases of the circulatory system: Secondary | ICD-10-CM

## 2017-01-16 DIAGNOSIS — E1022 Type 1 diabetes mellitus with diabetic chronic kidney disease: Secondary | ICD-10-CM | POA: Diagnosis present

## 2017-01-16 DIAGNOSIS — I10 Essential (primary) hypertension: Secondary | ICD-10-CM | POA: Diagnosis present

## 2017-01-16 DIAGNOSIS — Z6834 Body mass index (BMI) 34.0-34.9, adult: Secondary | ICD-10-CM | POA: Insufficient documentation

## 2017-01-16 DIAGNOSIS — Z881 Allergy status to other antibiotic agents status: Secondary | ICD-10-CM | POA: Diagnosis not present

## 2017-01-16 DIAGNOSIS — G4733 Obstructive sleep apnea (adult) (pediatric): Secondary | ICD-10-CM | POA: Diagnosis present

## 2017-01-16 DIAGNOSIS — Z7982 Long term (current) use of aspirin: Secondary | ICD-10-CM

## 2017-01-16 DIAGNOSIS — IMO0002 Reserved for concepts with insufficient information to code with codable children: Secondary | ICD-10-CM | POA: Diagnosis present

## 2017-01-16 DIAGNOSIS — E669 Obesity, unspecified: Secondary | ICD-10-CM | POA: Insufficient documentation

## 2017-01-16 DIAGNOSIS — Z923 Personal history of irradiation: Secondary | ICD-10-CM

## 2017-01-16 DIAGNOSIS — E1140 Type 2 diabetes mellitus with diabetic neuropathy, unspecified: Secondary | ICD-10-CM | POA: Diagnosis present

## 2017-01-16 DIAGNOSIS — E1065 Type 1 diabetes mellitus with hyperglycemia: Secondary | ICD-10-CM | POA: Diagnosis present

## 2017-01-16 DIAGNOSIS — K219 Gastro-esophageal reflux disease without esophagitis: Secondary | ICD-10-CM | POA: Insufficient documentation

## 2017-01-16 DIAGNOSIS — Z7902 Long term (current) use of antithrombotics/antiplatelets: Secondary | ICD-10-CM

## 2017-01-16 DIAGNOSIS — I2510 Atherosclerotic heart disease of native coronary artery without angina pectoris: Secondary | ICD-10-CM | POA: Diagnosis present

## 2017-01-16 DIAGNOSIS — M353 Polymyalgia rheumatica: Secondary | ICD-10-CM | POA: Diagnosis present

## 2017-01-16 DIAGNOSIS — Z888 Allergy status to other drugs, medicaments and biological substances status: Secondary | ICD-10-CM

## 2017-01-16 DIAGNOSIS — E785 Hyperlipidemia, unspecified: Secondary | ICD-10-CM | POA: Diagnosis present

## 2017-01-16 DIAGNOSIS — Z7984 Long term (current) use of oral hypoglycemic drugs: Secondary | ICD-10-CM

## 2017-01-16 DIAGNOSIS — I255 Ischemic cardiomyopathy: Secondary | ICD-10-CM | POA: Diagnosis present

## 2017-01-16 DIAGNOSIS — J9811 Atelectasis: Secondary | ICD-10-CM | POA: Diagnosis not present

## 2017-01-16 DIAGNOSIS — I110 Hypertensive heart disease with heart failure: Secondary | ICD-10-CM | POA: Diagnosis not present

## 2017-01-16 DIAGNOSIS — I130 Hypertensive heart and chronic kidney disease with heart failure and stage 1 through stage 4 chronic kidney disease, or unspecified chronic kidney disease: Secondary | ICD-10-CM | POA: Diagnosis present

## 2017-01-16 DIAGNOSIS — I5042 Chronic combined systolic (congestive) and diastolic (congestive) heart failure: Secondary | ICD-10-CM | POA: Diagnosis present

## 2017-01-16 DIAGNOSIS — Z79899 Other long term (current) drug therapy: Secondary | ICD-10-CM | POA: Diagnosis not present

## 2017-01-16 DIAGNOSIS — E119 Type 2 diabetes mellitus without complications: Secondary | ICD-10-CM | POA: Insufficient documentation

## 2017-01-16 DIAGNOSIS — M1990 Unspecified osteoarthritis, unspecified site: Secondary | ICD-10-CM | POA: Insufficient documentation

## 2017-01-16 DIAGNOSIS — E1165 Type 2 diabetes mellitus with hyperglycemia: Secondary | ICD-10-CM | POA: Diagnosis present

## 2017-01-16 DIAGNOSIS — I480 Paroxysmal atrial fibrillation: Secondary | ICD-10-CM | POA: Diagnosis present

## 2017-01-16 DIAGNOSIS — R002 Palpitations: Secondary | ICD-10-CM | POA: Diagnosis not present

## 2017-01-16 LAB — BASIC METABOLIC PANEL
Anion gap: 13 (ref 5–15)
BUN: 32 mg/dL — AB (ref 6–20)
CHLORIDE: 102 mmol/L (ref 101–111)
CO2: 20 mmol/L — AB (ref 22–32)
CREATININE: 1.32 mg/dL — AB (ref 0.61–1.24)
Calcium: 10.1 mg/dL (ref 8.9–10.3)
GFR calc Af Amer: >60 mL/min (ref >60)
GFR calc non Af Amer: 52 mL/min — ABNORMAL LOW (ref >60)
Glucose, Bld: 175 mg/dL — ABNORMAL HIGH (ref 65–99)
POTASSIUM: 4.4 mmol/L (ref 3.5–5.1)
SODIUM: 135 mmol/L (ref 135–145)

## 2017-01-16 LAB — CBC
HCT: 40.8 % (ref 39.0–52.0)
Hemoglobin: 13.7 g/dL (ref 13.0–17.0)
MCH: 28.8 pg (ref 26.0–34.0)
MCHC: 33.6 g/dL (ref 30.0–36.0)
MCV: 85.7 fL (ref 78.0–100.0)
Platelets: 200 10*3/uL (ref 150–400)
RBC: 4.76 MIL/uL (ref 4.22–5.81)
RDW: 13.9 % (ref 11.5–15.5)
WBC: 9.2 10*3/uL (ref 4.0–10.5)

## 2017-01-16 LAB — GLUCOSE, CAPILLARY: Glucose-Capillary: 195 mg/dL — ABNORMAL HIGH (ref 65–99)

## 2017-01-16 LAB — I-STAT TROPONIN, ED: Troponin i, poc: 0.08 ng/mL (ref 0.00–0.08)

## 2017-01-16 LAB — BRAIN NATRIURETIC PEPTIDE: B NATRIURETIC PEPTIDE 5: 363.7 pg/mL — AB (ref 0.0–100.0)

## 2017-01-16 LAB — MAGNESIUM: Magnesium: 2.1 mg/dL (ref 1.7–2.4)

## 2017-01-16 MED ORDER — SODIUM CHLORIDE 0.9% FLUSH: 3.0000 mL | Freq: Two times a day (BID) | INTRAVENOUS | Status: DC

## 2017-01-16 MED ORDER — AMIODARONE HCL IN DEXTROSE 360-4.14 MG/200ML-% IV SOLN
30.0000 mg/h | INTRAVENOUS | Status: DC
  Administered 2017-01-16 – 2017-01-17 (×2): 30 mg/h via INTRAVENOUS
  Filled 2017-01-16 (×3): qty 200

## 2017-01-16 MED ORDER — ADULT MULTIVITAMIN W/MINERALS CH
1.0000 | ORAL_TABLET | Freq: Every day | ORAL | Status: DC
  Administered 2017-01-17: 1 via ORAL
  Filled 2017-01-16: qty 1

## 2017-01-16 MED ORDER — SPIRONOLACTONE 25 MG PO TABS
25.0000 mg | ORAL_TABLET | Freq: Every day | ORAL | Status: DC
  Administered 2017-01-17: 25 mg via ORAL
  Filled 2017-01-16: qty 1

## 2017-01-16 MED ORDER — ACETAMINOPHEN 325 MG PO TABS: 650.0000 mg | ORAL_TABLET | ORAL | Status: DC | PRN

## 2017-01-16 MED ORDER — CARVEDILOL 3.125 MG PO TABS
3.1250 mg | ORAL_TABLET | Freq: Two times a day (BID) | ORAL | Status: DC
  Administered 2017-01-16 – 2017-01-17 (×2): 3.125 mg via ORAL
  Filled 2017-01-16 (×2): qty 1

## 2017-01-16 MED ORDER — INSULIN ASPART 100 UNIT/ML ~~LOC~~ SOLN
0.0000 [IU] | Freq: Three times a day (TID) | SUBCUTANEOUS | Status: DC
Start: 2017-01-16 — End: 2017-01-17
  Administered 2017-01-17: 2 [IU] via SUBCUTANEOUS

## 2017-01-16 MED ORDER — ONDANSETRON HCL 4 MG/2ML IJ SOLN: 4.0000 mg | Freq: Four times a day (QID) | INTRAMUSCULAR | Status: DC | PRN

## 2017-01-16 MED ORDER — FUROSEMIDE 40 MG PO TABS
60.0000 mg | ORAL_TABLET | Freq: Every day | ORAL | Status: DC
  Administered 2017-01-16: 60 mg via ORAL
  Filled 2017-01-16: qty 1

## 2017-01-16 MED ORDER — LOSARTAN POTASSIUM 25 MG PO TABS
25.0000 mg | ORAL_TABLET | Freq: Two times a day (BID) | ORAL | Status: DC
  Administered 2017-01-16 – 2017-01-17 (×2): 25 mg via ORAL
  Filled 2017-01-16 (×2): qty 1

## 2017-01-16 MED ORDER — RIVAROXABAN 15 MG PO TABS
15.0000 mg | ORAL_TABLET | Freq: Every day | ORAL | Status: DC
  Administered 2017-01-16 – 2017-01-17 (×2): 15 mg via ORAL
  Filled 2017-01-16 (×2): qty 1

## 2017-01-16 MED ORDER — INSULIN ASPART 100 UNIT/ML ~~LOC~~ SOLN: 0.0000 [IU] | Freq: Every day | SUBCUTANEOUS | Status: DC

## 2017-01-16 MED ORDER — METOPROLOL TARTRATE 5 MG/5ML IV SOLN
5.0000 mg | Freq: Once | INTRAVENOUS | Status: DC
  Filled 2017-01-16: qty 5

## 2017-01-16 MED ORDER — DIPHENHYDRAMINE-APAP (SLEEP) 25-500 MG PO TABS: 1.0000 | ORAL_TABLET | Freq: Every evening | ORAL | Status: DC | PRN

## 2017-01-16 MED ORDER — SODIUM CHLORIDE 0.9 % IV SOLN: 250.0000 mL | INTRAVENOUS | Status: DC | PRN

## 2017-01-16 MED ORDER — AMIODARONE HCL IN DEXTROSE 360-4.14 MG/200ML-% IV SOLN
60.0000 mg/h | INTRAVENOUS | Status: AC
  Administered 2017-01-16 (×2): 60 mg/h via INTRAVENOUS
  Filled 2017-01-16: qty 200

## 2017-01-16 MED ORDER — ACETAMINOPHEN 500 MG PO TABS: 500.0000 mg | ORAL_TABLET | Freq: Every day | ORAL | Status: DC | PRN

## 2017-01-16 MED ORDER — SODIUM CHLORIDE 0.9 % IV BOLUS (SEPSIS)
500.0000 mL | Freq: Once | INTRAVENOUS | Status: AC
  Administered 2017-01-16: 500 mL via INTRAVENOUS

## 2017-01-16 MED ORDER — DIGOXIN 125 MCG PO TABS
0.1250 mg | ORAL_TABLET | Freq: Every day | ORAL | Status: DC
  Administered 2017-01-17: 0.125 mg via ORAL
  Filled 2017-01-16: qty 1

## 2017-01-16 MED ORDER — AMIODARONE LOAD VIA INFUSION
150.0000 mg | Freq: Once | INTRAVENOUS | Status: AC
  Administered 2017-01-16: 150 mg via INTRAVENOUS
  Filled 2017-01-16: qty 83.34

## 2017-01-16 MED ORDER — TICAGRELOR 60 MG PO TABS
60.0000 mg | ORAL_TABLET | Freq: Two times a day (BID) | ORAL | Status: DC
  Administered 2017-01-16 – 2017-01-17 (×2): 60 mg via ORAL
  Filled 2017-01-16 (×3): qty 1

## 2017-01-16 MED ORDER — SODIUM CHLORIDE 0.9% FLUSH: 3.0000 mL | INTRAVENOUS | Status: DC | PRN

## 2017-01-16 MED ORDER — FUROSEMIDE 80 MG PO TABS
80.0000 mg | ORAL_TABLET | Freq: Every day | ORAL | Status: DC
  Administered 2017-01-17: 80 mg via ORAL
  Filled 2017-01-16: qty 1

## 2017-01-16 NOTE — ED Provider Notes (Signed)
  Face-to-face evaluation   History: He was exercising on a arm cycle machine when he had sudden onset rapid heart rate. He was in cardiac rehabilitation at the time. A 12-lead EKG showed A. fib with RVR. He states that he began to feel weak yesterday. Otherwise he's been doing well and felt like he can do his usual rehabilitation exercises, today.  Physical exam:Alert, elderly man who is comfortable. Heart irregular, pulse at rest, 50 per minute. Cardiac monitor shows atrial fibrillation with RVR at 150  Medical screening examination/treatment/procedure(s) were conducted as a shared visit with non-physician practitioner(s) and myself.  I personally evaluated the patient during the encounter   Daleen Bo, MD 01/19/17 1055

## 2017-01-16 NOTE — H&P (Signed)
Advanced Heart Failure Team History and Physical Note   Primary Physician:  Dr. Elease Hashimoto Primary Cardiologist:  Dr. Aundra Dubin   Reason for Admission: New onset Afib with RVR   HPI:    Wayne [name] is a 76 y.o. male with history of Chronic systolic CHF due to ICM, EF 15% with mildly reduced RV function,DM, HTN, and CAD s/p PCI with DES to proximal-mid LAD 11/2016.  Last seen in HF clinic 12/23/16 and was doing well. Losartan increased.  Pt presented to [facility] this am after high heart rates during Cardiac Rehab.  EKG performed that showed Afib RVR. Initially pt was in NSR, and noted  Pertinent labs on admission include K 4.4, Creatinine 1.32, Troponin 0.08.    Pt overall feeling good.  Has been feeling fluttery/pounding situation occasionally since earlier this week ( unsure if Tuesday or Wednesday).  Otherwise has been fine.  Treated for a URI several weeks ago.  Denies fever, chills, SOB, or CP.  Weight stable at home. Denies lightheadedness. Pt has noted more fatigue this past week.   Review of Systems: [y] = yes, [ ]  = no   General: Weight gain [ ] ; Weight loss [ ] ; Anorexia [ ] ; Fatigue [y]; Fever [ ] ; Chills [ ] ; Weakness [ ]   Cardiac: Chest pain/pressure [ ] ; Resting SOB [ ] ; Exertional SOB [ ] ; Orthopnea [ ] ; Pedal Edema [ ] ; Palpitations [y]; Syncope [ ] ; Presyncope [ ] ; Paroxysmal nocturnal dyspnea[ ]   Pulmonary: Cough [y]; Wheezing[ ] ; Hemoptysis[ ] ; Sputum [ ] ; Snoring [ ]   GI: Vomiting[ ] ; Dysphagia[ ] ; Melena[ ] ; Hematochezia [ ] ; Heartburn[ ] ; Abdominal pain [ ] ; Constipation [ ] ; Diarrhea [ ] ; BRBPR [ ]   GU: Hematuria[ ] ; Dysuria [ ] ; Nocturia[ ]   Vascular: Pain in legs with walking [ ] ; Pain in feet with lying flat [ ] ; Non-healing sores [ ] ; Stroke [ ] ; TIA [ ] ; Slurred speech [ ] ;  Neuro: Headaches[ ] ; Vertigo[ ] ; Seizures[ ] ; Paresthesias[ ] ;Blurred vision [ ] ; Diplopia [ ] ; Vision changes [ ]   Ortho/Skin: Arthritis [y]; Joint pain [y]; Muscle pain [ ] ; Joint swelling [ ] ;  Back Pain [ ] ; Rash [ ]   Psych: Depression[ ] ; Anxiety[ ]   Heme: Bleeding problems [ ] ; Clotting disorders [ ] ; Anemia [ ]   Endocrine: Diabetes [ ] ; Thyroid dysfunction[ ]    Home Medications Prior to Admission medications   Medication Sig Start Date End Date Taking? Authorizing Provider  acetaminophen (TYLENOL) 500 MG tablet Take 500 mg by mouth daily as needed for moderate pain or headache.    Historical Provider, MD  aspirin EC 81 MG tablet Take 81 mg by mouth daily.    Historical Provider, MD  carvedilol (COREG) 3.125 MG tablet Take 1 tablet (3.125 mg total) by mouth 2 (two) times daily. 11/10/16 02/08/17  Larey Dresser, MD  digoxin (LANOXIN) 0.125 MG tablet Take 1 tablet (0.125 mg total) by mouth daily. 11/21/16   Shirley Friar, PA-C  diphenhydramine-acetaminophen (TYLENOL PM) 25-500 MG TABS tablet Take 1 tablet by mouth at bedtime as needed (sleep).    Historical Provider, MD  furosemide (LASIX) 80 MG tablet Take 1 tablet (80 mg total) by mouth 2 (two) times daily. Patient taking differently: Take 80 mg by mouth 2 (two) times daily. Pt instructed on 12/23/16 to continue 80 mg in the morning and cut back to 60 mg for the evening dose. 11/10/16   Larey Dresser, MD  glimepiride (AMARYL) 2 MG  tablet Take 1 tablet (2 mg total) by mouth daily before breakfast. 10/31/16   [name], MD  glucose blood (ACCU-CHEK AVIVA) test strip Check blood sugars 1-2 times per day. DX: E11.65 09/30/16   [name], MD  Lancets (ACCU-CHEK SOFT TOUCH) lancets Check blood sugars 1-2 times per day. DX: E11.65 09/30/16   [name], MD  losartan (COZAAR) 25 MG tablet Take 1 tablet (25 mg total) by mouth 2 (two) times daily. 12/23/16   Larey Dresser, MD  Multiple Vitamins-Minerals (CENTRUM SILVER ADULT 50+) TABS Take 1 tablet by mouth daily.    Historical Provider, MD  potassium chloride SA (K-DUR,KLOR-CON) 20 MEQ tablet Take 2 tablets (40 mEq total) by mouth daily. Patient not taking:  Reported on 12/24/2016 11/21/16   Shirley Friar, PA-C  pravastatin (PRAVACHOL) 20 MG tablet Take 1 tablet (20 mg total) by mouth every evening. 10/02/16 12/31/16  Larey Dresser, MD  spironolactone (ALDACTONE) 25 MG tablet Take 1 tablet (25 mg total) by mouth daily. 12/03/16   Amy D Ninfa Meeker, NP  ticagrelor (BRILINTA) 90 MG TABS tablet Take 1 tablet (90 mg total) by mouth 2 (two) times daily. 11/20/16   Shirley Friar, PA-C    Past Medical History: Past Medical History:  Diagnosis Date  . Adenocarcinoma of prostate ([place])    s/p seed implants  . Arthritis   . Cellulitis of left leg    a. 0000000 complicated by septic shock  . Chronic combined systolic and diastolic CHF (congestive heart failure) ([place])   . Colon polyps   . CORONARY ATHEROSCLEROSIS NATIVE CORONARY ARTERY    a. 01/2011 Cath/PCI: LM nl, LAD 40-50p, D1 80-small, LCX 95-small, RI 90, RCA 100, EF 20%;  b. 01/2011 Card MRI - No transmural scar;  c. 01/2011 PCI RCA->5 Promus DES, RI->3.0x16 Promus DES; d. Cath 01/13/13 patent LAD & Ramus, diffuse LCx dz, RCA mult overlapping stents w/ 95% osital stenosis, EF 20%, s/p DES to ostial/prox RCA 01/24/13   . Diabetes mellitus, type 2 ([place])   . GERD (gastroesophageal reflux disease)   . Hematoma of leg    a. left leg hematoma 03/2012 in the setting of asa/effient  . Herpes zoster ophthalmicus   . HYPERLIPIDEMIA    intolerant to Lipitor (myalgias)  . HYPERTENSION   . Ischemic cardiomyopathy    a. 01/2012 Echo EF 45%, mild LVH; b. [id number], grade 1 diastolic dysfunction, diffuse hypokinesis, inferoposterior akinesis   . Noncompliance   . Obesity   . OSA (obstructive sleep apnea)   . Polymyalgia rheumatica ([facility])     Past Surgical History: Past Surgical History:  Procedure Laterality Date  . CARDIAC CATHETERIZATION  01/24/2013  . CARDIAC CATHETERIZATION N/A 11/14/2016   Procedure: Right/Left Heart Cath and Coronary Angiography;  Surgeon: Larey Dresser, MD;  Location: [place]  CV LAB;  Service: Cardiovascular;  Laterality: N/A;  . CARDIAC CATHETERIZATION N/A 11/17/2016   Procedure: Coronary Stent Intervention w/Impella;  Surgeon: Peter M Martinique, MD;  Location: [place] [facility];  Service: Cardiovascular;  Laterality: N/A;  . CARDIAC CATHETERIZATION N/A 11/17/2016   Procedure: Coronary Atherectomy;  Surgeon: Peter M Martinique, MD;  Location: [place] [facility];  Service: Cardiovascular;  Laterality: N/A;  . CORONARY ANGIOPLASTY WITH STENT PLACEMENT  01/24/2013   DES to RCA  . CORONARY STENT PLACEMENT  2012   reports 6 stents placed  . I&D EXTREMITY  06/15/2012   Procedure: IRRIGATION AND DEBRIDEMENT EXTREMITY;  Surgeon: Beverely Low  Fernanda Drum, MD;  Location: [place];  Service: Orthopedics;  Laterality: Left;  I&D Left Posterior Knee  . I&D EXTREMITY  06/30/2012   Procedure: IRRIGATION AND DEBRIDEMENT EXTREMITY;  Surgeon: Newt Minion, MD;  Location: [place];  Service: Orthopedics;  Laterality: Left;  Left Leg Irrigation and Debridement and placement of Wound VAC and application of  A-cell  . I&D EXTREMITY  07/20/2012   Procedure: IRRIGATION AND DEBRIDEMENT EXTREMITY;  Surgeon: Newt Minion, MD;  Location: [place];  Service: Orthopedics;  Laterality: Left;  Irrigation and Debridement Left Leg and Place antibiotic beads   . PERCUTANEOUS CORONARY STENT INTERVENTION (PCI-S) N/A 01/24/2013   Procedure: PERCUTANEOUS CORONARY STENT INTERVENTION (PCI-S);  Surgeon: Wellington Hampshire, MD;  Location: [facility] C[facility] LAB;  Service: Cardiovascular;  Laterality: N/A;  . PROSTATE SURGERY     cancer, seed implant  . ULTRASOUND GUIDANCE FOR VASCULAR ACCESS  11/14/2016   Procedure: Ultrasound Guidance For Vascular Access;  Surgeon: Larey Dresser, MD;  Location: [place] CV LAB;  Service: Cardiovascular;;    Family History:  Family History  Problem Relation Age of Onset  . Cancer Mother 18    unknown CA  . Heart disease Father   . Alcohol abuse Father   . Heart attack Father 7    Social  History: Social History   Social History  . Marital status: Married    Spouse name: N/A  . Number of children: 1  . Years of education: N/A   Occupational History  . 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 Smoking status: Former Smoker    Packs/day: 0.30    Years: 20.00    Types: Cigarettes    Quit date: 02/23/1989  . Smokeless tobacco: Never Used  . Alcohol use No  . Drug use: No  . Sexual activity: Not Asked   Other Topics Concern  . None   Social History Narrative   Retired Administrator    Allergies:  Allergies  Allergen Reactions  . Plavix [Clopidogrel]     Nose bleeds, swellings, whelps on legs & back, itching  . Lipitor [Atorvastatin] Other (See Comments)    REACTION: sore legs  . Crestor [Rosuvastatin Calcium] Other (See Comments)    Myalgias, Interfering with Gait  . Entresto [Sacubitril-Valsartan]     Angioedema  . Ancef [Cefazolin] Rash    Describes itching and rash, but said 'it wasn't that bad'    Objective:    Vital Signs:   Temp:  [97.7 F (36.5 C)] 97.7 F (36.5 C) (02/02 1314) Pulse Rate:  [84] 84 (02/02 1314) Resp:  [18] 18 (02/02 1314) BP: (95)/(79) 95/79 (02/02 1314) SpO2:  [97 %] 97 % (02/02 1314)    Physical Exam: General:  Elderly appearing. NAD. Obese.  HEENT: Normal Neck: supple. no JVD. Carotids 2+ bilat; no bruits. No thyromegaly or nodule noted.  Cor: PMI nondisplaced. Irregularly irregular. Tachy. No rubs, gallops or murmurs. Lungs: CTAB, normal effort Abdomen: Obese, soft, NT, ND, no HSM. No bruits or masses. +BS  Extremities: no cyanosis, clubbing, rash, trace ankle edema. Neuro: alert & oriented x 3, cranial nerves grossly intact. moves all 4 extremities w/o difficulty. Affect pleasant.  Telemetry: Reviewed, Atrial Fibrillation with RVR Rates 120-150s.  Labs: Basic Metabolic Panel: No results for input(s): NA, K, CL, CO2, GLUCOSE, BUN, CREATININE, CALCIUM, MG, PHOS in the last 168  hours.  Liver Function Tests: No results for input(s): AST, ALT,  ALKPHOS, BILITOT, PROT, ALBUMIN in the last 168 hours. No results for input(s): LIPASE, AMYLASE in the last 168 hours. No results for input(s): AMMONIA in the last 168 hours.  CBC:  Recent Labs Lab 01/16/17 1318  WBC 9.2  HGB 13.7  HCT 40.8  MCV 85.7  PLT 200    Cardiac Enzymes: No results for input(s): CKTOTAL, CKMB, CKMBINDEX, TROPONINI in the last 168 hours.  BNP: BNP (last 3 results)  Recent Labs  11/10/16 1132 11/14/16 1405 11/17/16 0210  BNP 2,394.6* 1,992.9* 2,032.0*    ProBNP (last 3 results) No results for input(s): PROBNP in the last 8760 hours.   CBG: No results for input(s): GLUCAP in the last 168 hours.  Coagulation Studies: No results for input(s): LABPROT, INR in the last 72 hours.  Other results: EKG: Afib RVR 145 bpm  Imaging: Dg Chest 2 View  Result Date: 01/16/2017 CLINICAL DATA:  New onset atrial fibrillation. EXAM: CHEST  2 VIEW COMPARISON:  11/15/2016 FINDINGS: Linear subsegmental atelectasis at the left base. Right lung is clear. Heart is normal size. No effusions or acute bony abnormality. IMPRESSION: Left base subsegmental atelectasis. Electronically Signed   By: Rolm Baptise M.D.   On: 01/16/2017 13:39      Assessment/Plan   Wayne [name] is a 76 y.o. male with history of Chronic systolic CHF due to ICM, EF 15% with mildly reduced RV function,DM, HTN, and CAD s/p PCI with DES to proximal-mid LAD 11/2016. Presented to ED with new onset AFib with RVR.  HF team consulted for admission.   1. New Onset Afib with RVR - Started sometime this week, at latest. Has had occasional palpitations with it. - Start IV amiodarone with bolus due to new onset and rapid rate.  Will try to limit use with shortage, but pt unlikely to tolerate such high rates.  - Pts CHA2DS2/VASC score is 27 with age, CHF, CAD, HTN, and DM.  Will decrease Brilinta to 60 mg daily and start Xarelto 15 mg daily  for anticoagulation.  Stop ASA - Pt has had history of Large hematoma, so will have to watch closely on AC.  2. Chronic systolic CHF due to ICM.  - Volume status stable on exam.  - Continue current lasix, spironolactone, losartan, coreg, and digoxin. - Plan on repeating Echo in 02/2017.  Will need ICD if EF remains < 35% - OK to take off Lifevest while he is on telemetry.  3. CAD s/p PCI to proximal LAD 11/17/16.  - stable.   - Will stop ASA with addition of Xarelto.  4. HLD - Continue pravachol 5. DM II - Will cover with sliding scale while in house.   Admit for further evaluation and treatment. Hope he will come out with amiodarone with apparent paroxysmal nature, but if not, will need DCCV early next week.   Length of Stay: 0  Annamaria Helling 01/16/2017, 1:56 PM  Advanced Heart Failure Team Pager [phone] (M-F; 7a - 4p)  Please contact [facility] for night-coverage after hours (4p -7a ) and weekends on [email or web address]  Patient seen with PA, agree with the above note.  Mr Crull was doing quite well until about 3 days ago.  He developed on and off palpitations after that and felt more fatigued.  In cardiac rehab this morning, he was noted to go into atrial fibrillation with RVR in the 140s.    He went to the ER, and is now on amiodarone gtt with HR in  90s (atrial fibrillation).  He feels ok at rest.  He is not volume overloaded on exam.   I suspect that he will tolerate atrial fibrillation poorly given his severe cardiomyopathy. I would like to get him back into NSR.  - I will start him on amiodarone gtt.   - Anticoagulation is going to be a problem.  He is allergic to Plavix, has spontaneous gastrocnemius hemorrhage with prasugrel.  He is currently on ASA 81 + ticagrelor, had LAD stent 2 months ago.  We will not be able to transition him to Plavix while on anticoagulation given allergy.  I am concerned with use of full dose ticagrelor with anticoagulation, especially with his  prior spontaneous calf bleed.  CHADSVASC = 5.  Based on [facility] data, will use Xarelto 15 mg daily.  I will decrease his ticagrelor dose to 60 mg bid and finish out at least 6 months of ticagrelor.  - If he does not return to NSR on his own, I will plan TEE-guided DCCV next week.   Loralie Champagne 01/16/2017 4:26 PM

## 2017-01-16 NOTE — Progress Notes (Signed)
Pt in today for exercise.  Pt noted while on the stepper to be in afib 120's. 110/60  Dr. Aundra Dubin office called and spoke to [facility].  Will plan to get a 12 lead ekg. Cherre Huger, BSN

## 2017-01-16 NOTE — Progress Notes (Signed)
Dr. Aundra Dubin reviewed pt 12 lead ekg.  Plan to transport pt to ER for further eval.  Pt called and spoke to his wife.  Pt transported via wheelchair and zoll.  Report of care given to [place].  Personal belongings secured. Cherre Huger, BSN

## 2017-01-16 NOTE — ED Provider Notes (Signed)
Wayne [name] Provider Note   CSN: [id number] Arrival date & time: 01/16/17  1304     History   Chief Complaint Chief Complaint  Patient presents with  . Atrial Fibrillation    HPI Wayne [name] is a 76 y.o. male.  HPI  76 y.o. male with history of DM, HTN, CAD, and ischemic cardiomyopathy , presents to the Emergency Department today from Cardiac rehab due to new onset atrial fibrillation. Pt states that he was on the "stepper" for cardiac rehab when it began. Pt as on continuous monitor before tachycardia occurred and it was noted he was in Afib with RVR. Pt currently without complaints. No CP/SOB/ABD pain. Per chart review, He had left and right heart catheterization in 12/17. This showed elevated right and left heart filling pressures and low cardiac output. The RCA was totally occluded with collaterals, there was 95% ostial to mid LAD stenosis.  Patient had PCI with DES to proximal-mid LAD. On Brilinta.  He is wearing a Lifevest. Echo (12/16) showed EF 20-25% with diffuse hypokinesis (down from 35% in 12/15).  Echo 10/17 showed EF 15% with mildly decreased RV systolic function.   Past Medical History:  Diagnosis Date  . Adenocarcinoma of prostate ([place])    s/p seed implants  . Arthritis   . Cellulitis of left leg    a. 0000000 complicated by septic shock  . Chronic combined systolic and diastolic CHF (congestive heart failure) ([place])   . Colon polyps   . CORONARY ATHEROSCLEROSIS NATIVE CORONARY ARTERY    a. 01/2011 Cath/PCI: LM nl, LAD 40-50p, D1 80-small, LCX 95-small, RI 90, RCA 100, EF 20%;  b. 01/2011 Card MRI - No transmural scar;  c. 01/2011 PCI RCA->5 Promus DES, RI->3.0x16 Promus DES; d. Cath 01/13/13 patent LAD & Ramus, diffuse LCx dz, RCA mult overlapping stents w/ 95% osital stenosis, EF 20%, s/p DES to ostial/prox RCA 01/24/13   . Diabetes mellitus, type 2 ([place])   . GERD (gastroesophageal reflux disease)   . Hematoma of leg    a. left leg hematoma 03/2012 in the setting  of asa/effient  . Herpes zoster ophthalmicus   . HYPERLIPIDEMIA    intolerant to Lipitor (myalgias)  . HYPERTENSION   . Ischemic cardiomyopathy    a. 01/2012 Echo EF 45%, mild LVH; b. [id number], grade 1 diastolic dysfunction, diffuse hypokinesis, inferoposterior akinesis   . Noncompliance   . Obesity   . OSA (obstructive sleep apnea)   . Polymyalgia rheumatica [facility])     Patient Active Problem List   Diagnosis Date Noted  . Coronary stent occlusion 11/18/2016  . Acute on chronic systolic CHF (congestive heart failure) ([place]) 11/14/2016  . Type 2 diabetes mellitus, uncontrolled ([place]) 09/30/2016  . CKD (chronic kidney disease) stage 3, GFR 30-59 ml/min 09/30/2016  . CAD (coronary artery disease) 11/20/2015  . Leucocytoclastic vasculitis ([place]) 06/25/2012  . Protein-calorie malnutrition, severe ([place]) 06/24/2012  . AKI (acute kidney injury) ([place]) 06/22/2012  . Staphylococcus aureus bacteremia with sepsis ([place]) 06/18/2012  . NSTEMI, initial episode of care ([place]) 06/16/2012  . Cellulitis and abscess of lower leg 06/14/2012    Class: Acute  . Healthcare-associated pneumonia 06/14/2012    Class: Acute  . Hyperglycemia 06/14/2012    Class: Acute  . Acute encephalopathy 06/14/2012    Class: Acute  . Leg swelling 04/22/2012  . Obesity 01/08/2012  . OSA (obstructive sleep apnea) 09/02/2011  . Systolic CHF, chronic ([place]) 06/10/2011  . Polymyalgia rheumatica ([place]) 06/02/2011  . HEMATOCHEZIA  02/10/2011  . BURSITIS, LEFT HIP 02/10/2011  . CORONARY ATHEROSCLEROSIS NATIVE CORONARY ARTERY 01/27/2011  . CARDIOMYOPATHY 01/21/2011  . CHEST PAIN UNSPECIFIED 01/20/2011  . DYSPNEA 01/16/2011  . ABDOMINAL PAIN, UNSPECIFIED SITE 01/13/2011  . ECZEMA 01/28/2010  . BRONCHITIS, ACUTE WITH MILD BRONCHOSPASM 12/21/2009  . HERPES ZOSTER OPHTHALMICUS 07/19/2009  . ADENOCARCINOMA, PROSTATE 06/20/2009  . Secondary DM with peripheral vascular disease, uncontrolled ([place]) 06/20/2009  . Hyperlipidemia 06/20/2009    . HYPERTENSION 06/20/2009  . HYPERTROPHY PROSTATE W/UR OBST & OTH LUTS 06/20/2009    Past Surgical History:  Procedure Laterality Date  . CARDIAC CATHETERIZATION  01/24/2013  . CARDIAC CATHETERIZATION N/A 11/14/2016   Procedure: Right/Left Heart Cath and Coronary Angiography;  Surgeon: Larey Dresser, MD;  Location: [place] CV LAB;  Service: Cardiovascular;  Laterality: N/A;  . CARDIAC CATHETERIZATION N/A 11/17/2016   Procedure: Coronary Stent Intervention w/Impella;  Surgeon: Peter M Martinique, MD;  Location: [place] CV LAB;  Service: Cardiovascular;  Laterality: N/A;  . CARDIAC CATHETERIZATION N/A 11/17/2016   Procedure: Coronary Atherectomy;  Surgeon: Peter M Martinique, MD;  Location: [place] CV LAB;  Service: Cardiovascular;  Laterality: N/A;  . CORONARY ANGIOPLASTY WITH STENT PLACEMENT  01/24/2013   DES to RCA  . CORONARY STENT PLACEMENT  2012   reports 6 stents placed  . I&D EXTREMITY  06/15/2012   Procedure: IRRIGATION AND DEBRIDEMENT EXTREMITY;  Surgeon: Newt Minion, MD;  Location: [place];  Service: Orthopedics;  Laterality: Left;  I&D Left Posterior Knee  . I&D EXTREMITY  06/30/2012   Procedure: IRRIGATION AND DEBRIDEMENT EXTREMITY;  Surgeon: Newt Minion, MD;  Location: [place];  Service: Orthopedics;  Laterality: Left;  Left Leg Irrigation and Debridement and placement of Wound VAC and application of  A-cell  . I&D EXTREMITY  07/20/2012   Procedure: IRRIGATION AND DEBRIDEMENT EXTREMITY;  Surgeon: Newt Minion, MD;  Location: [place];  Service: Orthopedics;  Laterality: Left;  Irrigation and Debridement Left Leg and Place antibiotic beads   . PERCUTANEOUS CORONARY STENT INTERVENTION (PCI-S) N/A 01/24/2013   Procedure: PERCUTANEOUS CORONARY STENT INTERVENTION (PCI-S);  Surgeon: Wellington Hampshire, MD;  Location: [facility] CATH LAB;  Service: Cardiovascular;  Laterality: N/A;  . PROSTATE SURGERY     cancer, seed implant  . ULTRASOUND GUIDANCE FOR VASCULAR ACCESS  11/14/2016   Procedure:  Ultrasound Guidance For Vascular Access;  Surgeon: Larey Dresser, MD;  Location: [place] CV LAB;  Service: Cardiovascular;;       Home Medications    Prior to Admission medications   Medication Sig Start Date End Date Taking? Authorizing Provider  acetaminophen (TYLENOL) 500 MG tablet Take 500 mg by mouth daily as needed for moderate pain or headache.    Historical Provider, MD  aspirin EC 81 MG tablet Take 81 mg by mouth daily.    Historical Provider, MD  carvedilol (COREG) 3.125 MG tablet Take 1 tablet (3.125 mg total) by mouth 2 (two) times daily. 11/10/16 02/08/17  Larey Dresser, MD  digoxin (LANOXIN) 0.125 MG tablet Take 1 tablet (0.125 mg total) by mouth daily. 11/21/16   Shirley Friar, PA-C  diphenhydramine-acetaminophen (TYLENOL PM) 25-500 MG TABS tablet Take 1 tablet by mouth at bedtime as needed (sleep).    Historical Provider, MD  furosemide (LASIX) 80 MG tablet Take 1 tablet (80 mg total) by mouth 2 (two) times daily. Patient taking differently: Take 80 mg by mouth 2 (two) times daily. Pt instructed on 12/23/16 to continue 80  mg in the morning and cut back to 60 mg for the evening dose. 11/10/16   Larey Dresser, MD  glimepiride (AMARYL) 2 MG tablet Take 1 tablet (2 mg total) by mouth daily before breakfast. 10/31/16   [name], MD  glucose blood (ACCU-CHEK AVIVA) test strip Check blood sugars 1-2 times per day. DX: E11.65 09/30/16   [name], MD  Lancets (ACCU-CHEK SOFT TOUCH) lancets Check blood sugars 1-2 times per day. DX: E11.65 09/30/16   [name], MD  losartan (COZAAR) 25 MG tablet Take 1 tablet (25 mg total) by mouth 2 (two) times daily. 12/23/16   Larey Dresser, MD  Multiple Vitamins-Minerals (CENTRUM SILVER ADULT 50+) TABS Take 1 tablet by mouth daily.    Historical Provider, MD  potassium chloride SA (K-DUR,KLOR-CON) 20 MEQ tablet Take 2 tablets (40 mEq total) by mouth daily. Patient not taking: Reported on 12/24/2016 11/21/16    Shirley Friar, PA-C  pravastatin (PRAVACHOL) 20 MG tablet Take 1 tablet (20 mg total) by mouth every evening. 10/02/16 12/31/16  Larey Dresser, MD  spironolactone (ALDACTONE) 25 MG tablet Take 1 tablet (25 mg total) by mouth daily. 12/03/16   Amy D Ninfa Meeker, NP  ticagrelor (BRILINTA) 90 MG TABS tablet Take 1 tablet (90 mg total) by mouth 2 (two) times daily. 11/20/16   Shirley Friar, PA-C    Family History Family History  Problem Relation Age of Onset  . Cancer Mother 71    unknown CA  . Heart disease Father   . Alcohol abuse Father   . Heart attack Father 47    Social History Social History  Substance Use Topics  . Smoking status: Former Smoker    Packs/day: 0.30    Years: 20.00    Types: Cigarettes    Quit date: 02/23/1989  . Smokeless tobacco: Never Used  . Alcohol use No     Allergies   Plavix [clopidogrel]; Lipitor [atorvastatin]; Crestor [rosuvastatin calcium]; Entresto [sacubitril-valsartan]; and Ancef [cefazolin]   Review of Systems Review of Systems ROS reviewed and all are negative for acute change except as noted in the HPI.  Physical Exam Updated Vital Signs BP 95/79   Pulse 84   Temp 97.7 F (36.5 C) (Oral)   Resp 18   SpO2 97%   Physical Exam  Constitutional: He is oriented to person, place, and time. Vital signs are normal. He appears well-developed and well-nourished.  HENT:  Head: Normocephalic and atraumatic.  Right Ear: Hearing normal.  Left Ear: Hearing normal.  Eyes: Conjunctivae and EOM are normal. Pupils are equal, round, and reactive to light.  Neck: Normal range of motion. Neck supple.  Cardiovascular: Normal rate, normal heart sounds, intact distal pulses and normal pulses.  An irregularly irregular rhythm present.  Pulmonary/Chest: Effort normal and breath sounds normal. No respiratory distress. He has no rales.  Musculoskeletal: Normal range of motion.  Neurological: He is alert and oriented to person, place, and time.   Skin: Skin is warm and dry.  Psychiatric: He has a normal mood and affect. His speech is normal and behavior is normal. Thought content normal.  Nursing note and vitals reviewed.  ED Treatments / Results  Labs (all labs ordered are listed, but only abnormal results are displayed) Labs Reviewed  BASIC METABOLIC PANEL - Abnormal; Notable for the following:       Result Value   CO2 20 (*)    Glucose, Bld 175 (*)    BUN  32 (*)    Creatinine, Ser 1.32 (*)    GFR calc non Af Amer 52 (*)    All other components within normal limits  CBC  MAGNESIUM  BRAIN NATRIURETIC PEPTIDE  I-STAT TROPOININ, ED    EKG  EKG Interpretation  Date/Time:  Friday January 16 2017 13:12:24 EST Ventricular Rate:  145 PR Interval:    QRS Duration: 120 QT Interval:  322 QTC Calculation: 500 R Axis:   53 Text Interpretation:  Atrial fibrillation with rapid ventricular response Possible Anterior infarct , age undetermined Abnormal ECG Since last tracing of earlier today No significant change was found Confirmed by Eulis Foster  MD, ELLIOTT [phone]) on 01/16/2017 2:32:03 PM       Radiology Dg Chest 2 View  Result Date: 01/16/2017 CLINICAL DATA:  New onset atrial fibrillation. EXAM: CHEST  2 VIEW COMPARISON:  11/15/2016 FINDINGS: Linear subsegmental atelectasis at the left base. Right lung is clear. Heart is normal size. No effusions or acute bony abnormality. IMPRESSION: Left base subsegmental atelectasis. Electronically Signed   By: Rolm Baptise M.D.   On: 01/16/2017 13:39    Procedures Procedures (including critical care time) CRITICAL CARE Performed by: Ozella Rocks   Total critical care time: 35 minutes  Critical care time was exclusive of separately billable procedures and treating other patients.  Critical care was necessary to treat or prevent imminent or life-threatening deterioration.  Critical care was time spent personally by me on the following activities: development of treatment plan with  patient and/or surrogate as well as nursing, discussions with consultants, evaluation of patient's response to treatment, examination of patient, obtaining history from patient or surrogate, ordering and performing treatments and interventions, ordering and review of laboratory studies, ordering and review of radiographic studies, pulse oximetry and re-evaluation of patient's condition.   Medications Ordered in ED Medications  metoprolol (LOPRESSOR) injection 5 mg (0 mg Intravenous Hold 01/16/17 1407)  sodium chloride 0.9 % bolus 500 mL (500 mLs Intravenous New Bag/Given 01/16/17 1407)    Initial Impression / Assessment and Plan / ED Course  I have reviewed the triage vital signs and the nursing notes.  Pertinent labs & imaging results that were available during my care of the patient were reviewed by me and considered in my medical decision making (see chart for details).    Final Clinical Impressions(s) / ED Diagnoses  {I have reviewed and evaluated the relevant laboratory values. {I have reviewed and evaluated the relevant imaging studies. {I have interpreted the relevant EKG. {I have reviewed the relevant previous healthcare records. {I have reviewed EMS Documentation. {I obtained HPI from historian. {Patient discussed with supervising physician.  ED Course:  Assessment: Pt is a 88yM with hx DM, HTN, CAD, and ischemic cardiomyopathy  who presents with new onset Afib while on stepper at Cardiac Rehab facility. On exam, pt in NAD. Nontoxic/nonseptic appearing. VS with irregularly irregular rhythm. Normotensive. Afebrile. Lungs CTA. Heart RRR. Abdomen nontender soft. EKG with Afib with RVR. Trop negative. Labs otherwise unremarkable. CXR unreamrkable. CHADVAsc 3. Consult with Cardiology will see in ED. Started on Amiodarone. Plan is to admit to cardiology.   Disposition/Plan:  Admit Pt acknowledges and agrees with plan  Supervising Physician Daleen Bo, MD  Final diagnoses:  Atrial  fibrillation with RVR [facility])    New Prescriptions New Prescriptions   No medications on file     Shary Decamp, PA-C 01/16/17 [place], MD 01/19/17 1055

## 2017-01-16 NOTE — ED Notes (Signed)
Pt returned from X-ray.  

## 2017-01-16 NOTE — ED Notes (Signed)
Pt went to X-ray.   

## 2017-01-16 NOTE — ED Triage Notes (Signed)
Pt brought here from cardiac rehab. Pt had stent placed in dec and has low ef, lifevest in place. Pt was in rehab today and they noted new onset atrial fib. Pt has no complaints, no cp or sob.

## 2017-01-16 NOTE — ED Notes (Signed)
Lab to add on magnesium and BNP

## 2017-01-16 NOTE — Progress Notes (Signed)
Wayne [name] 76 y.o. male Nutrition Note Spoke with pt. Nutrition Plan and Nutrition Survey goals reviewed with pt. Pt is following Step 1 of the Therapeutic Lifestyle Changes diet. Pt wants to lose wt. Pt has been trying to lose wt by decreasing portion sizes consumed. Pt reports his pre-hospital wt was 263 lb. Per EMR, highest wt noted was 244 lb 07/14/16. Pt wt is down 26 lb over the past 6-7 months. Wt loss tips reviewed. Pt is diabetic. Last A1c indicates blood glucose not well-controlled. Pt states he does not want to go on insulin. This Probation officer went over Diabetes Education test results. Pt checks CBG's every other day due to the cost of test strips and pt does not have medication coverage on with his Medicare.Pt reports his fasting CBG's run 130-160 mg/dL. Pt is actively trying to limit simple carbs to help with CBG control. Pt with dx of CHF. Pt is aware of the need to limit sodium intake. Pt expressed understanding of the information reviewed. Pt aware of nutrition education classes offered and plans on attending nutrition classes.  Lab Results  Component Value Date   HGBA1C 8.7 (H) 07/14/2016   Wt Readings from Last 3 Encounters:  12/23/16 228 lb 12 oz (103.8 kg)  12/22/16 228 lb 8 oz (103.6 kg)  12/16/16 225 lb 15.5 oz (102.5 kg)   Nutrition Diagnosis ? Food-and nutrition-related knowledge deficit related to lack of exposure to information as related to diagnosis of: ? CVD ? DM ? Obesity related to excessive energy intake as evidenced by a BMI of 35.3  Nutrition Intervention ? Pt's individual nutrition plan reviewed with pt. ? Benefits of adopting Therapeutic Lifestyle Changes discussed when Medficts reviewed. ? Pt to attend the Portion Distortion class ? Pt to attend the Diabetes Q & A class ? Pt to attend the   ? Nutrition I class                  ? Nutrition II class     ? Diabetes Blitz class  ? Continue client-centered nutrition education by RD, as part of interdisciplinary  care. Goal(s) ? Pt to identify and limit food sources of saturated fat, trans fat, and sodium ? Pt to identify food quantities necessary to achieve weight loss of 6-24 lb (2.7-10.9 kg) at graduation from cardiac rehab.  ? CBG concentrations in the normal range or as close to normal as is safely possible. Monitor and Evaluate progress toward nutrition goal with team. Derek Mound, M.Ed, RD, LDN, CDE 01/16/2017 12:13 PM

## 2017-01-17 LAB — CBC WITH DIFFERENTIAL/PLATELET
Basophils Absolute: 0 10*3/uL (ref 0.0–0.1)
Basophils Relative: 0 %
Eosinophils Absolute: 0.6 10*3/uL (ref 0.0–0.7)
Eosinophils Relative: 7 %
HCT: 35.7 % — ABNORMAL LOW (ref 39.0–52.0)
Hemoglobin: 11.8 g/dL — ABNORMAL LOW (ref 13.0–17.0)
LYMPHS ABS: 1.9 10*3/uL (ref 0.7–4.0)
LYMPHS PCT: 21 %
MCH: 28.4 pg (ref 26.0–34.0)
MCHC: 33.1 g/dL (ref 30.0–36.0)
MCV: 86 fL (ref 78.0–100.0)
Monocytes Absolute: 0.8 10*3/uL (ref 0.1–1.0)
Monocytes Relative: 9 %
NEUTROS PCT: 63 %
Neutro Abs: 5.6 10*3/uL (ref 1.7–7.7)
Platelets: 192 10*3/uL (ref 150–400)
RBC: 4.15 MIL/uL — AB (ref 4.22–5.81)
RDW: 14 % (ref 11.5–15.5)
WBC: 8.9 10*3/uL (ref 4.0–10.5)

## 2017-01-17 LAB — BASIC METABOLIC PANEL
ANION GAP: 8 (ref 5–15)
BUN: 32 mg/dL — ABNORMAL HIGH (ref 6–20)
CHLORIDE: 103 mmol/L (ref 101–111)
CO2: 25 mmol/L (ref 22–32)
Calcium: 9.2 mg/dL (ref 8.9–10.3)
Creatinine, Ser: 1.48 mg/dL — ABNORMAL HIGH (ref 0.61–1.24)
GFR calc non Af Amer: 45 mL/min — ABNORMAL LOW (ref >60)
GFR, EST AFRICAN AMERICAN: 52 mL/min — AB (ref >60)
Glucose, Bld: 146 mg/dL — ABNORMAL HIGH (ref 65–99)
POTASSIUM: 4.4 mmol/L (ref 3.5–5.1)
SODIUM: 136 mmol/L (ref 135–145)

## 2017-01-17 MED ORDER — AMIODARONE HCL 200 MG PO TABS
400.0000 mg | ORAL_TABLET | Freq: Two times a day (BID) | ORAL | Status: DC
  Administered 2017-01-17: 400 mg via ORAL
  Filled 2017-01-17: qty 2

## 2017-01-17 MED ORDER — RIVAROXABAN 15 MG PO TABS: 15.0000 mg | ORAL_TABLET | Freq: Every day | ORAL | 3 refills | Status: DC

## 2017-01-17 MED ORDER — AMIODARONE HCL 200 MG PO TABS: ORAL_TABLET | ORAL | 6 refills | Status: DC

## 2017-01-17 MED ORDER — TICAGRELOR 60 MG PO TABS
60.0000 mg | ORAL_TABLET | Freq: Two times a day (BID) | ORAL | 6 refills | Status: DC
Start: 2017-01-17 — End: 2017-05-27

## 2017-01-17 NOTE — Discharge Summary (Signed)
Discharge Summary    Patient ID: Wayne [name],  MRN: [id number], DOB/AGE: [birth year] 76 y.o.  Admit date: 01/16/2017 Discharge date: 01/17/2017  Primary Care Provider: [name] Primary Cardiologist: Einar Crow, MD   Discharge Diagnoses    Primary discharge diagnosis:   Atrial fibrillation with rapid ventricular response ([place])  **Converted on amiodarone.  **Xarelto 15 mg daily initiated in setting of need for brilinta as well.  Active Problems:   Essential hypertension   Systolic CHF, chronic (HCC)   Type 2 diabetes mellitus, uncontrolled (HCC)   CKD (chronic kidney disease) stage 3, GFR 30-59 ml/min   Hyperlipidemia   Obesity   CAD (coronary artery disease)  Allergies Allergies  Allergen Reactions  . Plavix [Clopidogrel]     Nose bleeds, swellings, whelps on legs & back, itching  . Lipitor [Atorvastatin] Other (See Comments)    REACTION: sore legs  . Crestor [Rosuvastatin Calcium] Other (See Comments)    Myalgias, Interfering with Gait  . Entresto [Sacubitril-Valsartan]     Angioedema  . Ancef [Cefazolin] Rash    Describes itching and rash, but said 'it wasn't that bad'    Diagnostic Studies/Procedures    None _____________   History of Present Illness     76 year old male with a prior history of coronary artery disease status post multiple interventions and recent LAD stenting, ischemic cardiomyopathy with an EF of 0000000, chronic systolic CHF, diabetes, hypertension, hyperlipidemia, obesity, and stage III chronic kidney disease. He is followed closely in the congestive heart failure clinic. He had been feeling well but presented to the [facility] emergency department on February 2 after a several day history of intermittent fluttering and palpitations. He was found in cardiac rehabilitation on that date to be in atrial fibrillation with a rapid ventricular response. He was placed on intravenous amiodarone and admitted for further evaluation.  Hospital Course      Consultants: None  Following admission, patient converted to sinus rhythm while on IV amiodarone. This was subsequently transitioned to oral amiodarone. Patient has been on aspirin and Brilinta as an outpatient in the setting of recent LAD stenting. He is allergic to Plavix. Complicating matters further with regards to oral anticoagulation in the setting of new diagnosis of A. fib, he has had previous spontaneous hematoma of the calf while on Effient. Based on data from the [facility] trial, it was decided to place him on Xarelto 15 mg daily and his Brilinta dose was reduced to 60 mg twice a day with plan to keep him on this dose for at least 6 months.  Patient has been ambulating without recurrent symptoms or limitations. He'll be discharged home today in good condition. He has had no evidence of volume overload during this admission. He has follow-up arranged for the February 19 heart failure clinic and plans for repeat echo in March 2018 to determine whether or not he will require ICD therapy. He does have a life vest in place at this time. _____________  Discharge Vitals Blood pressure (!) 107/50, pulse 74, temperature 98 F (36.7 C), temperature source Oral, resp. rate 16, height 5\' 8"  (1.727 m), weight 225 lb (102.1 kg), SpO2 97 %.  Filed Weights   01/16/17 1738 01/17/17 0601  Weight: 220 lb (99.8 kg) 225 lb (102.1 kg)    Labs & Radiologic Studies    CBC  Recent Labs  01/16/17 1318 01/17/17 0349  WBC 9.2 8.9  NEUTROABS  --  5.6  HGB 13.7 11.8*  HCT 40.8 35.7*  MCV 85.7 86.0  PLT 200 [id number]   Basic Metabolic Panel  Recent Labs  01/16/17 1318 01/17/17 0349  NA 135 136  K 4.4 4.4  CL 102 103  CO2 20* 25  GLUCOSE 175* 146*  BUN 32* 32*  CREATININE 1.32* 1.48*  CALCIUM 10.1 9.2  MG 2.1  --   _____________  Dg Chest 2 View  Result Date: 01/16/2017 CLINICAL DATA:  New onset atrial fibrillation. EXAM: CHEST  2 VIEW COMPARISON:  11/15/2016 FINDINGS: Linear subsegmental  atelectasis at the left base. Right lung is clear. Heart is normal size. No effusions or acute bony abnormality. IMPRESSION: Left base subsegmental atelectasis. Electronically Signed   By: Rolm Baptise M.D.   On: 01/16/2017 13:39   Disposition   Pt is being discharged home today in good condition.  Follow-up Plans & Appointments    Follow-up Information    Loralie Champagne, MD Follow up on 01/23/2017.   Specialty:  Cardiology Why:  9:20 AM Contact information: [id number] [place] [address] [phone]          Discharge Instructions    Diet - low sodium heart healthy    Complete by:  As directed    Increase activity slowly    Complete by:  As directed       Discharge Medications   Current Discharge Medication List    START taking these medications   Details  amiodarone (PACERONE) 200 MG tablet 2 tabs bid x 1 week, then 1 tab BID x 1 week, then 1 tab daily Qty: 60 tablet, Refills: 6    Rivaroxaban (XARELTO) 15 MG TABS tablet Take 1 tablet (15 mg total) by mouth daily. Qty: 30 tablet, Refills: 3      CONTINUE these medications which have CHANGED   Details  ticagrelor (BRILINTA) 60 MG TABS tablet Take 1 tablet (60 mg total) by mouth 2 (two) times daily. Qty: 60 tablet, Refills: 6      CONTINUE these medications which have NOT CHANGED   Details  acetaminophen (TYLENOL) 500 MG tablet Take 500 mg by mouth daily as needed for moderate pain or headache.    carvedilol (COREG) 3.125 MG tablet Take 1 tablet (3.125 mg total) by mouth 2 (two) times daily. Qty: 60 tablet, Refills: 3    digoxin (LANOXIN) 0.125 MG tablet Take 1 tablet (0.125 mg total) by mouth daily. Qty: 30 tablet, Refills: 6    diphenhydramine-acetaminophen (TYLENOL PM) 25-500 MG TABS tablet Take 1 tablet by mouth at bedtime as needed (sleep).    furosemide (LASIX) 80 MG tablet Take 1 tablet (80 mg total) by mouth 2 (two) times daily. Qty: 60 tablet, Refills: 3   Associated  Diagnoses: Chronic systolic heart failure (HCC)    glimepiride (AMARYL) 2 MG tablet Take 1 tablet (2 mg total) by mouth daily before breakfast. Qty: 30 tablet, Refills: 3    glucose blood (ACCU-CHEK AVIVA) test strip Check blood sugars 1-2 times per day. DX: E11.65 Qty: 200 each, Refills: 5    Lancets (ACCU-CHEK SOFT TOUCH) lancets Check blood sugars 1-2 times per day. DX: E11.65 Qty: 200 each, Refills: 5    losartan (COZAAR) 25 MG tablet Take 1 tablet (25 mg total) by mouth 2 (two) times daily. Qty: 60 tablet, Refills: 6    Multiple Vitamins-Minerals (CENTRUM SILVER ADULT 50+) TABS Take 1 tablet by mouth daily.    spironolactone (ALDACTONE) 25 MG tablet Take 1 tablet (25 mg total)  by mouth daily. Qty: 30 tablet, Refills: 6    pravastatin (PRAVACHOL) 20 MG tablet Take 1 tablet (20 mg total) by mouth every evening. Qty: 30 tablet, Refills: 11      STOP taking these medications     aspirin EC 81 MG tablet      potassium chloride SA (K-DUR,KLOR-CON) 20 MEQ tablet          Outstanding Labs/Studies   Will need f/u bmet and cbc as outpt   new to xarelto. Will need f/u LFT's, TFT's, and PFT's  new to amiodarone  Duration of Discharge Encounter   Greater than 30 minutes including physician time.  Signed, Murray Hodgkins NP 01/17/2017, 4:11 PM

## 2017-01-17 NOTE — Progress Notes (Signed)
Patient ID: IYAD MCVOY, male   DOB: 06-10-1941, 76 y.o.   MRN: [id number]   SUBJECTIVE: Patient converted to NSR overnight, has remained in NSR during the day today.  Feels better, less fatigued in NSR.   Scheduled Meds: . amiodarone  400 mg Oral BID  . carvedilol  3.125 mg Oral BID  . digoxin  0.125 mg Oral Daily  . furosemide  60 mg Oral q1800  . furosemide  80 mg Oral Daily  . insulin aspart  0-5 Units Subcutaneous QHS  . insulin aspart  0-9 Units Subcutaneous TID WC  . losartan  25 mg Oral BID  . multivitamin with minerals  1 tablet Oral Daily  . rivaroxaban  15 mg Oral Daily  . sodium chloride flush  3 mL Intravenous Q12H  . spironolactone  25 mg Oral Daily  . ticagrelor  60 mg Oral BID   Continuous Infusions: PRN Meds:.sodium chloride, acetaminophen, ondansetron (ZOFRAN) IV, sodium chloride flush    Vitals:   01/16/17 1710 01/16/17 1738 01/16/17 2018 01/17/17 0601  BP: (!) 94/52 99/68 (!) 97/56 (!) 107/50  Pulse:  (!) 135 76 74  Resp:  18 16 16   Temp:  97.7 F (36.5 C) 97.8 F (36.6 C) 98 F (36.7 C)  TempSrc:  Oral Oral Oral  SpO2:  96% 96% 97%  Weight:  220 lb (99.8 kg)  225 lb (102.1 kg)  Height:  5\' 8"  (1.727 m)      Intake/Output Summary (Last 24 hours) at 01/17/17 1448 Last data filed at 01/17/17 0413  Gross per 24 hour  Intake           803.92 ml  Output             1230 ml  Net          -426.08 ml    LABS: Basic Metabolic Panel:  Recent Labs  01/16/17 1318 01/17/17 0349  NA 135 136  K 4.4 4.4  CL 102 103  CO2 20* 25  GLUCOSE 175* 146*  BUN 32* 32*  CREATININE 1.32* 1.48*  CALCIUM 10.1 9.2  MG 2.1  --    Liver Function Tests: No results for input(s): AST, ALT, ALKPHOS, BILITOT, PROT, ALBUMIN in the last 72 hours. No results for input(s): LIPASE, AMYLASE in the last 72 hours. CBC:  Recent Labs  01/16/17 1318 01/17/17 0349  WBC 9.2 8.9  NEUTROABS  --  5.6  HGB 13.7 11.8*  HCT 40.8 35.7*  MCV 85.7 86.0  PLT 200 192   Cardiac  Enzymes: No results for input(s): CKTOTAL, CKMB, CKMBINDEX, TROPONINI in the last 72 hours. BNP: Invalid input(s): POCBNP D-Dimer: No results for input(s): DDIMER in the last 72 hours. Hemoglobin A1C: No results for input(s): HGBA1C in the last 72 hours. Fasting Lipid Panel: No results for input(s): CHOL, HDL, LDLCALC, TRIG, CHOLHDL, LDLDIRECT in the last 72 hours. Thyroid Function Tests: No results for input(s): TSH, T4TOTAL, T3FREE, THYROIDAB in the last 72 hours.  Invalid input(s): FREET3 Anemia Panel: No results for input(s): VITAMINB12, FOLATE, FERRITIN, TIBC, IRON, RETICCTPCT in the last 72 hours.  RADIOLOGY: Dg Chest 2 View  Result Date: 01/16/2017 CLINICAL DATA:  New onset atrial fibrillation. EXAM: CHEST  2 VIEW COMPARISON:  11/15/2016 FINDINGS: Linear subsegmental atelectasis at the left base. Right lung is clear. Heart is normal size. No effusions or acute bony abnormality. IMPRESSION: Left base subsegmental atelectasis. Electronically Signed   By: Rolm Baptise M.D.   On: 01/16/2017  13:39    PHYSICAL EXAM General: NAD Neck: No JVD, no thyromegaly or thyroid nodule.  Lungs: Clear to auscultation bilaterally with normal respiratory effort. CV: Nondisplaced PMI.  Heart regular S1/S2, no S3/S4, no murmur.  No peripheral edema.   Abdomen: Soft, nontender, no hepatosplenomegaly, no distention.  Neurologic: Alert and oriented x 3.  Psych: Normal affect. Extremities: No clubbing or cyanosis.   TELEMETRY: Reviewed telemetry pt in NSR  ASSESSMENT AND PLAN: THAMAS KROENING is a 76 y.o. male with history of chronic systolic CHF due to ischemic cardiomyopathy, EF 15% with mildly reduced RV function,DM, HTN, and CAD s/p PCI with DES to proximal-mid LAD 11/2016. Presented to ED with new onset AFib with RVR.  HF team consulted for admission.  1. Atrial fibrillation: Paroxysmal.  Newly diagnosed. Pt's CHA2DS2/VASC score is 69 with age, CHF, CAD, HTN, and DM.   I suspect that he will  tolerate atrial fibrillation poorly given his severe cardiomyopathy. I would like to keep him in NSR.  He actually converted spontaneously last night and has been in NSR all day.  Feels better, less fatigued.  - Stop amiodarone gtt, can start 400 mg po bid.   - Anticoagulation is going to be a problem.  He is allergic to Plavix, had spontaneous gastrocnemius hemorrhage with prasugrel.  He was admitted on ASA 81 + ticagrelor, had LAD stent 2 months ago.  We will not be able to transition him to Plavix while on anticoagulation given allergy.  I am concerned with use of full dose ticagrelor with anticoagulation, especially with his prior spontaneous calf bleed.  Based on [facility] data, will use Xarelto 15 mg daily.  I will decrease his ticagrelor dose to 60 mg bid and finish out at least 6 months of ticagrelor.  2. Chronic systolic CHF due to ICM: Volume status stable on exam.  - Continue current lasix, spironolactone, losartan, coreg, and digoxin. - Plan on repeating Echo in 02/2017.  Will need ICD if EF remains < 35% - Wear Lifevest when he goes home.  3. CAD s/p PCI to proximal LAD 11/17/16.  - Will stop ASA with addition of Xarelto and decrease ticagrelor as above.  4. HLD: Continue pravachol 5. Disposition: I think he can go home today.  Has been in NSR > 12 hrs. He should have followup with me on 2/9.  Meds for home: Amiodarone 400 mg bid x 1 week, 200 mg bid x 1 week, then 200 mg daily; ticagrelor 60 mg bid;  Xarelto 15 mg daily; Lasix 80 qam/60 qpm; Coreg 3.125 mg bid; digoxin 0.125 daily; losartan 25 bid; pravastatin 20 daily; KCl 40 daily.   Loralie Champagne 01/17/2017  Advanced Heart Failure Team Pager [phone] (M-F; 7a - 4p)  Please contact [facility] for night-coverage after hours (4p -7a ) and weekends on [email or web address]

## 2017-01-17 NOTE — Progress Notes (Signed)
Kelton Pillar to be D/C'd Home per MD order. Discussed with the patient and all questions fully answered.  Allergies as of 01/17/2017      Reactions   Plavix [clopidogrel]    Nose bleeds, swellings, whelps on legs & back, itching   Lipitor [atorvastatin] Other (See Comments)   REACTION: sore legs   Crestor [rosuvastatin Calcium] Other (See Comments)   Myalgias, Interfering with Gait   Entresto [sacubitril-valsartan]    Angioedema   Ancef [cefazolin] Rash   Describes itching and rash, but said 'it wasn't that bad'      Medication List    STOP taking these medications   aspirin EC 81 MG tablet   potassium chloride SA 20 MEQ tablet Commonly known as:  K-DUR,KLOR-CON     TAKE these medications   accu-chek soft touch lancets Check blood sugars 1-2 times per day. DX: E11.65   acetaminophen 500 MG tablet Commonly known as:  TYLENOL Take 500 mg by mouth daily as needed for moderate pain or headache.   amiodarone 200 MG tablet Commonly known as:  PACERONE 2 tabs bid x 1 week, then 1 tab BID x 1 week, then 1 tab daily   carvedilol 3.125 MG tablet Commonly known as:  COREG Take 1 tablet (3.125 mg total) by mouth 2 (two) times daily.   CENTRUM SILVER ADULT 50+ Tabs Take 1 tablet by mouth daily.   digoxin 0.125 MG tablet Commonly known as:  LANOXIN Take 1 tablet (0.125 mg total) by mouth daily.   diphenhydramine-acetaminophen 25-500 MG Tabs tablet Commonly known as:  TYLENOL PM Take 1 tablet by mouth at bedtime as needed (sleep).   furosemide 80 MG tablet Commonly known as:  LASIX Take 1 tablet (80 mg total) by mouth 2 (two) times daily. What changed:  how much to take  when to take this  additional instructions   glimepiride 2 MG tablet Commonly known as:  AMARYL Take 1 tablet (2 mg total) by mouth daily before breakfast.   glucose blood test strip Commonly known as:  ACCU-CHEK AVIVA Check blood sugars 1-2 times per day. DX: E11.65   losartan 25 MG  tablet Commonly known as:  COZAAR Take 1 tablet (25 mg total) by mouth 2 (two) times daily.   pravastatin 20 MG tablet Commonly known as:  PRAVACHOL Take 1 tablet (20 mg total) by mouth every evening.   Rivaroxaban 15 MG Tabs tablet Commonly known as:  XARELTO Take 1 tablet (15 mg total) by mouth daily. Start taking on:  01/18/2017   spironolactone 25 MG tablet Commonly known as:  ALDACTONE Take 1 tablet (25 mg total) by mouth daily.   ticagrelor 60 MG Tabs tablet Commonly known as:  BRILINTA Take 1 tablet (60 mg total) by mouth 2 (two) times daily. What changed:  medication strength  how much to take       VVS, Skin clean, dry and intact without evidence of skin break down, no evidence of skin tears noted.  IV catheter discontinued intact. Site without signs and symptoms of complications. Dressing and pressure applied.  An After Visit Summary was printed and given to the patient.  Patient escorted via [place], and D/C home via private auto.  Wayne [name]  01/17/2017 5:16 PM

## 2017-01-19 ENCOUNTER — Ambulatory Visit (HOSPITAL_COMMUNITY): Payer: Medicare Other

## 2017-01-19 ENCOUNTER — Telehealth (HOSPITAL_COMMUNITY): Payer: Self-pay | Admitting: *Deleted

## 2017-01-19 ENCOUNTER — Encounter (HOSPITAL_COMMUNITY): Payer: Medicare Other

## 2017-01-19 LAB — GLUCOSE, CAPILLARY
GLUCOSE-CAPILLARY: 166 mg/dL — AB (ref 65–99)
GLUCOSE-CAPILLARY: 230 mg/dL — AB (ref 65–99)
Glucose-Capillary: 158 mg/dL — ABNORMAL HIGH (ref 65–99)

## 2017-01-19 NOTE — Telephone Encounter (Signed)
-----   Message from Larey Dresser, MD sent at 01/19/2017  2:04 PM EST ----- Regarding: RE: Ok to return to cardiac rehab Yes no restriction ----- Message ----- From: Rowe Pavy, RN Sent: 01/19/2017   6:50 AM To: Larey Dresser, MD, Rogelia Mire, NP Subject: Madaline Brilliant to return to cardiac rehab                  Discharged on 2/3. May patient return to rehab?  Follow up scheduled for 2/9/ heart failure clinic.  Any restrictions?  Thanks  [place]lette

## 2017-01-20 ENCOUNTER — Telehealth: Payer: Self-pay | Admitting: Pharmacist

## 2017-01-20 NOTE — Telephone Encounter (Signed)
Received fax from [employer] that pt is approved for Repatha coverage through 12/14/17. He will not be approved again next year even though he is uninsured because he qualifies for [employer]. LMOM for pt to return call with this update.

## 2017-01-21 ENCOUNTER — Ambulatory Visit (HOSPITAL_COMMUNITY): Payer: Medicare Other

## 2017-01-21 ENCOUNTER — Encounter (HOSPITAL_COMMUNITY)
Admission: RE | Admit: 2017-01-21 | Discharge: 2017-01-21 | Disposition: A | Discharge status: Home / Self Care | Payer: Medicare Other | Source: Ambulatory Visit | Attending: Cardiology | Admitting: Cardiology

## 2017-01-21 DIAGNOSIS — I5042 Chronic combined systolic (congestive) and diastolic (congestive) heart failure: Secondary | ICD-10-CM | POA: Diagnosis not present

## 2017-01-21 DIAGNOSIS — K219 Gastro-esophageal reflux disease without esophagitis: Secondary | ICD-10-CM | POA: Diagnosis not present

## 2017-01-21 DIAGNOSIS — M353 Polymyalgia rheumatica: Secondary | ICD-10-CM | POA: Diagnosis not present

## 2017-01-21 DIAGNOSIS — Z7984 Long term (current) use of oral hypoglycemic drugs: Secondary | ICD-10-CM | POA: Diagnosis not present

## 2017-01-21 DIAGNOSIS — I2510 Atherosclerotic heart disease of native coronary artery without angina pectoris: Secondary | ICD-10-CM | POA: Diagnosis not present

## 2017-01-21 DIAGNOSIS — Z955 Presence of coronary angioplasty implant and graft: Secondary | ICD-10-CM | POA: Diagnosis not present

## 2017-01-21 DIAGNOSIS — I110 Hypertensive heart disease with heart failure: Secondary | ICD-10-CM | POA: Diagnosis not present

## 2017-01-21 DIAGNOSIS — Z8546 Personal history of malignant neoplasm of prostate: Secondary | ICD-10-CM | POA: Diagnosis not present

## 2017-01-21 DIAGNOSIS — I255 Ischemic cardiomyopathy: Secondary | ICD-10-CM | POA: Diagnosis not present

## 2017-01-21 DIAGNOSIS — E119 Type 2 diabetes mellitus without complications: Secondary | ICD-10-CM | POA: Diagnosis not present

## 2017-01-21 DIAGNOSIS — G4733 Obstructive sleep apnea (adult) (pediatric): Secondary | ICD-10-CM | POA: Diagnosis not present

## 2017-01-21 DIAGNOSIS — Z6834 Body mass index (BMI) 34.0-34.9, adult: Secondary | ICD-10-CM | POA: Diagnosis not present

## 2017-01-21 DIAGNOSIS — Z79899 Other long term (current) drug therapy: Secondary | ICD-10-CM | POA: Diagnosis not present

## 2017-01-21 DIAGNOSIS — M1990 Unspecified osteoarthritis, unspecified site: Secondary | ICD-10-CM | POA: Diagnosis not present

## 2017-01-21 DIAGNOSIS — E669 Obesity, unspecified: Secondary | ICD-10-CM | POA: Diagnosis not present

## 2017-01-21 DIAGNOSIS — E785 Hyperlipidemia, unspecified: Secondary | ICD-10-CM | POA: Diagnosis not present

## 2017-01-21 DIAGNOSIS — Z87891 Personal history of nicotine dependence: Secondary | ICD-10-CM | POA: Diagnosis not present

## 2017-01-21 DIAGNOSIS — Z7982 Long term (current) use of aspirin: Secondary | ICD-10-CM | POA: Diagnosis not present

## 2017-01-23 ENCOUNTER — Encounter (HOSPITAL_COMMUNITY)
Admission: RE | Admit: 2017-01-23 | Discharge: 2017-01-23 | Disposition: A | Discharge status: Home / Self Care | Payer: Medicare Other | Source: Ambulatory Visit | Attending: Cardiology | Admitting: Cardiology

## 2017-01-23 ENCOUNTER — Encounter (HOSPITAL_COMMUNITY): Payer: Self-pay

## 2017-01-23 ENCOUNTER — Ambulatory Visit (HOSPITAL_COMMUNITY): Payer: Medicare Other

## 2017-01-23 ENCOUNTER — Ambulatory Visit (HOSPITAL_COMMUNITY)
Admission: RE | Admit: 2017-01-23 | Discharge: 2017-01-23 | Disposition: A | Discharge status: Home / Self Care | Payer: Medicare Other | Source: Ambulatory Visit | Attending: Cardiology | Admitting: Cardiology

## 2017-01-23 VITALS — BP 138/70 | HR 82 | Wt 232.1 lb

## 2017-01-23 DIAGNOSIS — I110 Hypertensive heart disease with heart failure: Secondary | ICD-10-CM | POA: Diagnosis not present

## 2017-01-23 DIAGNOSIS — Z7984 Long term (current) use of oral hypoglycemic drugs: Secondary | ICD-10-CM | POA: Diagnosis not present

## 2017-01-23 DIAGNOSIS — Z955 Presence of coronary angioplasty implant and graft: Secondary | ICD-10-CM | POA: Insufficient documentation

## 2017-01-23 DIAGNOSIS — Z87891 Personal history of nicotine dependence: Secondary | ICD-10-CM | POA: Insufficient documentation

## 2017-01-23 DIAGNOSIS — E669 Obesity, unspecified: Secondary | ICD-10-CM | POA: Insufficient documentation

## 2017-01-23 DIAGNOSIS — Z8249 Family history of ischemic heart disease and other diseases of the circulatory system: Secondary | ICD-10-CM | POA: Insufficient documentation

## 2017-01-23 DIAGNOSIS — I2510 Atherosclerotic heart disease of native coronary artery without angina pectoris: Secondary | ICD-10-CM | POA: Insufficient documentation

## 2017-01-23 DIAGNOSIS — Z6835 Body mass index (BMI) 35.0-35.9, adult: Secondary | ICD-10-CM | POA: Diagnosis not present

## 2017-01-23 DIAGNOSIS — M1990 Unspecified osteoarthritis, unspecified site: Secondary | ICD-10-CM | POA: Insufficient documentation

## 2017-01-23 DIAGNOSIS — M353 Polymyalgia rheumatica: Secondary | ICD-10-CM | POA: Diagnosis not present

## 2017-01-23 DIAGNOSIS — I5023 Acute on chronic systolic (congestive) heart failure: Secondary | ICD-10-CM

## 2017-01-23 DIAGNOSIS — Z809 Family history of malignant neoplasm, unspecified: Secondary | ICD-10-CM | POA: Insufficient documentation

## 2017-01-23 DIAGNOSIS — M47812 Spondylosis without myelopathy or radiculopathy, cervical region: Secondary | ICD-10-CM | POA: Diagnosis not present

## 2017-01-23 DIAGNOSIS — Z8546 Personal history of malignant neoplasm of prostate: Secondary | ICD-10-CM | POA: Insufficient documentation

## 2017-01-23 DIAGNOSIS — Z7982 Long term (current) use of aspirin: Secondary | ICD-10-CM | POA: Diagnosis not present

## 2017-01-23 DIAGNOSIS — I255 Ischemic cardiomyopathy: Secondary | ICD-10-CM | POA: Diagnosis not present

## 2017-01-23 DIAGNOSIS — E785 Hyperlipidemia, unspecified: Secondary | ICD-10-CM | POA: Diagnosis not present

## 2017-01-23 DIAGNOSIS — I4891 Unspecified atrial fibrillation: Secondary | ICD-10-CM

## 2017-01-23 DIAGNOSIS — Z79899 Other long term (current) drug therapy: Secondary | ICD-10-CM | POA: Diagnosis not present

## 2017-01-23 DIAGNOSIS — I739 Peripheral vascular disease, unspecified: Secondary | ICD-10-CM | POA: Diagnosis not present

## 2017-01-23 DIAGNOSIS — I480 Paroxysmal atrial fibrillation: Secondary | ICD-10-CM

## 2017-01-23 DIAGNOSIS — E119 Type 2 diabetes mellitus without complications: Secondary | ICD-10-CM | POA: Diagnosis not present

## 2017-01-23 DIAGNOSIS — Z7901 Long term (current) use of anticoagulants: Secondary | ICD-10-CM | POA: Insufficient documentation

## 2017-01-23 DIAGNOSIS — G4733 Obstructive sleep apnea (adult) (pediatric): Secondary | ICD-10-CM | POA: Diagnosis not present

## 2017-01-23 DIAGNOSIS — I5022 Chronic systolic (congestive) heart failure: Secondary | ICD-10-CM

## 2017-01-23 DIAGNOSIS — I5042 Chronic combined systolic (congestive) and diastolic (congestive) heart failure: Secondary | ICD-10-CM | POA: Diagnosis not present

## 2017-01-23 LAB — CBC
HCT: 39.7 % (ref 39.0–52.0)
HEMOGLOBIN: 13 g/dL (ref 13.0–17.0)
MCH: 28.3 pg (ref 26.0–34.0)
MCHC: 32.7 g/dL (ref 30.0–36.0)
MCV: 86.3 fL (ref 78.0–100.0)
PLATELETS: 213 10*3/uL (ref 150–400)
RBC: 4.6 MIL/uL (ref 4.22–5.81)
RDW: 14.4 % (ref 11.5–15.5)
WBC: 8.1 10*3/uL (ref 4.0–10.5)

## 2017-01-23 LAB — COMPREHENSIVE METABOLIC PANEL
ALBUMIN: 3.8 g/dL (ref 3.5–5.0)
ALK PHOS: 67 U/L (ref 38–126)
ALT: 21 U/L (ref 17–63)
AST: 23 U/L (ref 15–41)
Anion gap: 11 (ref 5–15)
BILIRUBIN TOTAL: 0.7 mg/dL (ref 0.3–1.2)
BUN: 35 mg/dL — AB (ref 6–20)
CO2: 25 mmol/L (ref 22–32)
CREATININE: 1.52 mg/dL — AB (ref 0.61–1.24)
Calcium: 9.6 mg/dL (ref 8.9–10.3)
Chloride: 99 mmol/L — ABNORMAL LOW (ref 101–111)
GFR calc Af Amer: 51 mL/min — ABNORMAL LOW (ref >60)
GFR calc non Af Amer: 44 mL/min — ABNORMAL LOW (ref >60)
GLUCOSE: 322 mg/dL — AB (ref 65–99)
Potassium: 4.6 mmol/L (ref 3.5–5.1)
SODIUM: 135 mmol/L (ref 135–145)
TOTAL PROTEIN: 6.7 g/dL (ref 6.5–8.1)

## 2017-01-23 LAB — TSH: TSH: 1.771 u[IU]/mL (ref 0.350–4.500)

## 2017-01-23 LAB — DIGOXIN LEVEL: Digoxin Level: 1 ng/mL (ref 0.8–2.0)

## 2017-01-23 MED ORDER — PRAVASTATIN SODIUM 20 MG PO TABS: 20.0000 mg | ORAL_TABLET | Freq: Every evening | ORAL | 2 refills | Status: DC

## 2017-01-23 MED ORDER — CARVEDILOL 6.25 MG PO TABS: 6.2500 mg | ORAL_TABLET | Freq: Two times a day (BID) | ORAL | 2 refills | Status: DC

## 2017-01-23 NOTE — Progress Notes (Signed)
Patient ID: Wayne [name], male   DOB: 10/25/1941, 76 y.o.   MRN: [id number]   Advanced Heart Failure Clinic Note   Patient ID: Wayne [name], male   DOB: 06/02/1941, 76 y.o.   MRN: [id number] PCP: Dr. Elease Hashimoto Cardiology: Dr. Aundra Dubin  76 yo with history of DM, HTN, CAD, and ischemic cardiomyopathy presents for cardiology followup. Patient had a CHF exacerbation in 2/12 and was found to have LV systolic dysfunction with EF around 20%. LHC showed RCA, ramus, and CFX disease. RCA was subtotally occluded. Cardiac MRI showed that all walls, including the inferior wall, should be viable. Patient therefore underwent opening of his chronic totally occluded RCA as well as PCI to the ramus in 2/12. He received drug eluting stents and was on Effient.  He had an echo in 2/13 that showed EF improved to 45% with moderate LV dilation and mild LV hypertrophy.   In 5/13, he developed a large left lower leg hematoma.  He was still on Effient at that time.  ASA and Effient were stopped.  The hematoma did not resolve.  In 7/13, he was re-admitted with septic shock from MSSA from an abscess in his left gastrocnemius.  He also grew Pseudomonas from the left gastrocnemius as well.  He had a prolonged course in the hospital and later in a rehab facility.  Ultimately, he got back home again.  I had him get an echo in 1/14, and this showed EF 15% with diffuse hypokinesis and inferoposterior akinesis.  He had been off of most of his prior cardiac medications.  I took him back for [facility] in 1/14.  This showed subtotal occlusion of a small AV LCx and 95% ostial in-stent restenosis in the RCA.  He was treated in 2/14 with a Xience DES to the ostial RCA and begun on Plavix.  Unfortunately, he developed diffuse hives after starting Plavix and had to be switched to ticlopidine.  He has tolerated ticlopidine.   Echo done in 12/14 showed some improvement in LV function but EF was still low at 30-35%.  He did not want ICD.    Presented to ED  11/14/15 with worsening SOB after a few steps and CXR with CHF and bilateral effusions in the setting of marked medical non-compliance. States he stopped taking his medicines in 01/2015 (except for ASA 81 and a multivitamin).  He was feeling good and decided that he did not need them anymore.  Also c/o URI symptoms. Diuresed over 2 L with IV diuretics in the ER and started on lasix 20 mg daily.   Echo (12/16) showed EF 20-25% with diffuse hypokinesis (down from 35% in 12/15).  Echo 10/17 showed EF 15% with mildly decreased RV systolic function.    He had left and right heart catheterization in 12/17. This showed elevated right and left heart filling pressures and low cardiac output. The RCA was totally occluded with collaterals, there was 95% ostial to mid LAD stenosis.  Patient had PCI with DES to proximal-mid LAD.    He has had some difficulty with medication compliance.  However, he has is now in the [facility] trial medical arm and visits with the research nurse have really helped his medication compliance. He is currently taking meds as ordered.   He was noted to be in atrial fibrillation with RVR in 2/18 at cardiac rehab. He felt fatigued.  He was admitted and started on amiodarone for rate control. ASA was stopped, ticagrelor was decreased to 60 mg  bid, and Xarelto 15 mg daily was started.  He converted spontaneously back to NSR.    He remains in NSR today.  He is wearing a Lifevest.  No palpitations. No dyspnea walking on flat ground or 1 flight of steps.  No chest pain.  No orthopnea/PND.  Less fatigued since he went back into NSR. He stopped pravastatin because of his hip pain.  Being off pravastatin did not help his hip pain.  Labs (12/16): K 4.9, creatinine 1.23 Labs (1/17): K 4.7, creatinine 1.15, BNP 1433 Labs (2/17): K 4.7, creatinine 1.14, BNP 870 Labs (3/17): K 4.5, creatinine 1.10, BNP 1257 Labs (5/17): K 4.2, creatinine 1.2, BNP 818 Labs (7/17): LDL 149, HDL 40, TGs 210 Labs (9/17):  K 4.9, creatinine 1.36 Labs (10/17): K 3.7, creatinine 1.2, LDL 87, HDL 32, LFTs normal Labs (12/17): K 4.5, creatinine 1.34, digoxin 0.3 Labs (2/18): K 4.4, creatinine 1.48, hgb 11.8  ECG: NSR, IVCD 128 msec  Past Medical History:  1. HYPERTENSION  2. HYPERLIPIDEMIA: Myalgias with atorvastatin and Crestor 3. ECZEMA  4. RHINITIS  5. HERPES ZOSTER OPHTHALMICUS  6. ADENOCARCINOMA, PROSTATE: Status post prostatectomy in 2009. Has had some incontinence since then.  7. Diabetes mellitus type II  8. Arthritis  9. Obesity  10. GERD: rare  11. CAD: Presented with exertional dyspnea, never had chest pain. LHC (2/12) with subtotalled proximal RCA and left to right collaterals, 90% proximal moderate-sized ramus, 95% proximal relatively small CFX, 40-50% proximal LAD. Cardiac MRI (2/12) showed EF 21%, some mild scar in basal segments but all wall segments would be expected to be viable. DES x 5 (overlapping) to RCA, DES x 1 to RI 01/30/11 .  Bled into leg with Effient use (long, complicated course).  LHC (1/14): AV LCx small with subtotal occlusion, 95% ostial instent restenosis in RCA. PCI in 2/14 to ostial RCA with 3.5 x 15 Xience DES.  Plavix allergy (hives) so put on ticlopidine.  - LHC (12/17):  the RCA was totally occluded with collaterals, there was 95% ostial to mid LAD stenosis, patient had PCI with DES to proximal-mid LAD. 12. Ischemic CMP: Echo (2/12) with moderately dilated LV, EF about 20% with diffuse hypokinesis and inferior akinesis, pseudonormal diastolic function, mild MR, severe LAE, mildly decreased RV systolic function. RHC (2/12) with mean RA 12, PA 40/25, mean PCWP 26, CI 2.1.  Echo (5/12) with EF 40% (appeared worse to my eye) with posterior HK, basal inferior AK, inferoseptal AK, basal anteroseptal AK, mild MR.  Cardiac MRI was repeated and showed EF 32% (improved from 21%) and mild LV dilation (was severely dilated before) with diffuse hypokinesis and subendocardial scar in the basal  inferior, basal posterior, and basal anterolateral segments.  Echo (2/13) with EF 45%, moderate LV dilation, mild LVH.  Echo (1/14) with EF 15%, diffuse hypokinesis, inferoposterior akinesis, mild MR, grade I diastolic dysfunction.  Echo (5/14) with EF 30-35%, mild LV dilation, akinesis of the basal inferior wall otherwise diffuse hypokinesis.  Echo (12/14) with EF 30-35%, mild LV dilation, diffuse hypokinesis with basal inferior and posterior akinesis. Echo (12/15) with EF 35%, mildly dilated LV, wall motion abnormalities, normal RV size and systolic function. Echo (12/16) with EF 20-25%, diffuse hypokinesis, severe LV dilation, mild MR.  - Echo (10/17): EF 15%, grade II diastolic dysfunction, mild MR, mildly decreased RV systolic function.  - Possible angioedema related to Entresto.  - Hyperkalemia with spironolactone 12.5 daily.  - CPX (10/17): peak VO2 10.6, VE/VCO2 slope 48.6, RER  1.12.  Severe HF limitation.  - RHC (12/17): mean RA 14, PA 53/27, mean PCWP 25, CI 1.93.  13. Cervical OA.  14. Polymyalgia rheumatica 15. OSA: Severe on sleep study 10/12.  On CPAP.  16. Left lower leg hematoma in setting of Effient use.  He developed a left gastrocnemius abscess and septic shock with prolonged hospitalization beginning in 7/13.  17. PAD: Lower extremity arterial doppler evaluation in 2/17 showed occluded peroneal arteries bilaterally, ABI 1.1 (R) and 1.0 (L).   18. Carotid dopplers (10/17) with minimal disease.  19. Atrial fibrillation: Paroxysmal.   Family History:  Father died with MI at age 68. He was an alcoholic. Mother died with cancer at around 77.   Social History:  Occupation: retired Administrator  Married, lives in [place]  Past smoker, quit around [place]: all systems reviewed and negative except as per HPI.    Current Outpatient Prescriptions  Medication Sig Dispense Refill  . acetaminophen (TYLENOL) 500 MG tablet Take 500 mg by mouth daily as needed for moderate pain or  headache.    Marland Kitchen amiodarone (PACERONE) 200 MG tablet 2 tabs bid x 1 week, then 1 tab BID x 1 week, then 1 tab daily 60 tablet 6  . carvedilol (COREG) 6.25 MG tablet Take 1 tablet (6.25 mg total) by mouth 2 (two) times daily. 60 tablet 2  . digoxin (LANOXIN) 0.125 MG tablet Take 1 tablet (0.125 mg total) by mouth daily. 30 tablet 6  . diphenhydramine-acetaminophen (TYLENOL PM) 25-500 MG TABS tablet Take 1 tablet by mouth at bedtime as needed (sleep).    . Evolocumab (REPATHA SURECLICK) [id number] MG/ML SOAJ Inject 1 pen into the skin every 14 (fourteen) days.    . furosemide (LASIX) 20 MG tablet Take by mouth. 80 mg in the AM and 60 mg in the PM    . glimepiride (AMARYL) 2 MG tablet Take 1 tablet (2 mg total) by mouth daily before breakfast. 30 tablet 3  . glucose blood (ACCU-CHEK AVIVA) test strip Check blood sugars 1-2 times per day. DX: E11.65 200 each 5  . Lancets (ACCU-CHEK SOFT TOUCH) lancets Check blood sugars 1-2 times per day. DX: E11.65 200 each 5  . losartan (COZAAR) 25 MG tablet Take 1 tablet (25 mg total) by mouth 2 (two) times daily. 60 tablet 6  . Multiple Vitamins-Minerals (CENTRUM SILVER ADULT 50+) TABS Take 1 tablet by mouth daily.    . pravastatin (PRAVACHOL) 20 MG tablet Take 1 tablet (20 mg total) by mouth every evening. 30 tablet 2  . Rivaroxaban (XARELTO) 15 MG TABS tablet Take 1 tablet (15 mg total) by mouth daily. 30 tablet 3  . spironolactone (ALDACTONE) 25 MG tablet Take 1 tablet (25 mg total) by mouth daily. 30 tablet 6  . ticagrelor (BRILINTA) 60 MG TABS tablet Take 1 tablet (60 mg total) by mouth 2 (two) times daily. 60 tablet 6   No current facility-administered medications for this encounter.     BP 138/70 (BP Location: Right Arm, Patient Position: Sitting, Cuff Size: Normal)   Pulse 82   Wt 232 lb 1.9 oz (105.3 kg)   SpO2 98%   BMI 35.29 kg/m    Wt Readings from Last 3 Encounters:  01/23/17 232 lb 1.9 oz (105.3 kg)  01/17/17 225 lb (102.1 kg)  12/23/16 228 lb 12  oz (103.8 kg)    General: NAD, obese.  Neck: Thick, JVP 7 cm, no thyromegaly or thyroid nodule. No carotid bruit.  HEENT: Normal.  Lungs: Bilateral rhonchi CV: Nondisplaced PMI. Heart regular S1/S2, no S3/S4, no murmur. Difficult to palpate pedal pulses.  Abdomen: Soft, NT, mild/moderate distention, no HSM. No bruits or masses. +BS  Neurologic: Alert and oriented x 3.  Psych: Pleasant affect.  Extremities: No clubbing or cyanosis. No edema.  Assessment/Plan:  1. Chronic systolic CHF: Ischemic cardiomyopathy.  10/17 echo with EF 15%, diffuse hypokinesis.  CPX in 10/17 with severe HF limitation.  RHC in 12/17 with elevated filling pressures and low cardiac output.  He is now s/p PCI to proximal to mid LAD with much improved symptoms.  Now NYHA class II with no volume overload on exam.  - Continue Lasix 80 qam/60 qpm. BMET today.  - Continue spironolactone 25 daily.   - Continue losartan 25 mg bid. Of note, patient had possible angioedema related to [facility].   - Increase Coreg to 6.25 mg bid.   - Continue digoxin, check level today.    - Repeat echo in 3/18.  If EF is < 35%, will need ICD and can stop Lifevest. He would not be a CRT candidate with narrow QRS.  - He may be an LVAD candidate in the future.  As above, compliance has improved.  - Continue cardiac rehab.  2. CAD:  Now s/p PCI to proximal LAD.  He had an occluded RCA but there were left to right collaterals.  Much improved symptomatically.  - Restart pravastatin 20 mg daily (not causing hip pain) => sounds like he has been approved for Repatha, needs to start.  - Anticoagulation for atrial fibrillation with antiplatelet agents is complicated with Mr Chauncey. He is allergic to Plavix, had spontaneous gastrocnemius hemorrhage with prasugrel.  He had been on ASA 81 + ticagrelor, had LAD stent in 12/17. We will not be able to transition him to Plavix while on anticoagulation given allergy. I am concerned with use of full dose  ticagrelor with anticoagulation, especially with his prior spontaneous calf bleed. Based on [facility] data, will use Xarelto 15 mg daily. I decreased his ticagrelor dose to 60 mg bid and finish out at least 6 months of ticagrelor.  3. HLD: Has had myalgias with Crestor and atorvastatin.  He is now on pravastatin 20 mg daily => plan to start [place] soon.   4. PAD: Occluded peroneal arteries bilaterally.  No significant claudication.  5. Diabetes type II: Unable to get empagliflozin. 6. Atrial fibrillation:  He is in NSR today.  - As above, will be on combination of ticagrelor 60 mg bid and Xarelto 15 daily.  CBC today.  - Continue amiodarone, taper down to 200 mg daily.  Check LFTs, TSH.  Will need regular eye exams while on amiodarone.  - Would consider atrial fibrillation ablation based on [facility] data.   Followup in 1 month.    Loralie Champagne  01/23/2017

## 2017-01-23 NOTE — Patient Instructions (Signed)
Labs today (will call for abnormal results, otherwise no news is good news)   Increase Carvedilol to 6.25 mg (1 Tablet) Two times daily  Restart Pravastatin  Echo has been ordered for you, please have this done in March 2018.  Pharmacy Clinic has been contacted for you to start [place].  They will contact you to be seen in their clinic.   Follow up in 1 month

## 2017-01-26 ENCOUNTER — Encounter (HOSPITAL_COMMUNITY)
Admission: RE | Admit: 2017-01-26 | Discharge: 2017-01-26 | Disposition: A | Discharge status: Home / Self Care | Payer: Medicare Other | Source: Ambulatory Visit | Attending: Cardiology | Admitting: Cardiology

## 2017-01-26 ENCOUNTER — Ambulatory Visit (HOSPITAL_COMMUNITY): Payer: Medicare Other

## 2017-01-26 DIAGNOSIS — I2510 Atherosclerotic heart disease of native coronary artery without angina pectoris: Secondary | ICD-10-CM | POA: Diagnosis not present

## 2017-01-26 DIAGNOSIS — I110 Hypertensive heart disease with heart failure: Secondary | ICD-10-CM | POA: Diagnosis not present

## 2017-01-26 DIAGNOSIS — I5042 Chronic combined systolic (congestive) and diastolic (congestive) heart failure: Secondary | ICD-10-CM | POA: Diagnosis not present

## 2017-01-26 DIAGNOSIS — Z79899 Other long term (current) drug therapy: Secondary | ICD-10-CM | POA: Diagnosis not present

## 2017-01-26 DIAGNOSIS — Z955 Presence of coronary angioplasty implant and graft: Secondary | ICD-10-CM

## 2017-01-26 DIAGNOSIS — Z7982 Long term (current) use of aspirin: Secondary | ICD-10-CM | POA: Diagnosis not present

## 2017-01-28 ENCOUNTER — Encounter (HOSPITAL_COMMUNITY)
Admission: RE | Admit: 2017-01-28 | Discharge: 2017-01-28 | Disposition: A | Discharge status: Home / Self Care | Payer: Medicare Other | Source: Ambulatory Visit | Attending: Cardiology | Admitting: Cardiology

## 2017-01-28 ENCOUNTER — Ambulatory Visit (HOSPITAL_COMMUNITY): Payer: Medicare Other

## 2017-01-28 DIAGNOSIS — I110 Hypertensive heart disease with heart failure: Secondary | ICD-10-CM | POA: Diagnosis not present

## 2017-01-28 DIAGNOSIS — I2510 Atherosclerotic heart disease of native coronary artery without angina pectoris: Secondary | ICD-10-CM | POA: Diagnosis not present

## 2017-01-28 DIAGNOSIS — Z7982 Long term (current) use of aspirin: Secondary | ICD-10-CM | POA: Diagnosis not present

## 2017-01-28 DIAGNOSIS — I5042 Chronic combined systolic (congestive) and diastolic (congestive) heart failure: Secondary | ICD-10-CM | POA: Diagnosis not present

## 2017-01-28 DIAGNOSIS — Z955 Presence of coronary angioplasty implant and graft: Secondary | ICD-10-CM | POA: Diagnosis not present

## 2017-01-28 DIAGNOSIS — Z79899 Other long term (current) drug therapy: Secondary | ICD-10-CM | POA: Diagnosis not present

## 2017-01-30 ENCOUNTER — Encounter (HOSPITAL_COMMUNITY)
Admission: RE | Admit: 2017-01-30 | Discharge: 2017-01-30 | Disposition: A | Discharge status: Home / Self Care | Payer: Medicare Other | Source: Ambulatory Visit | Attending: Cardiology | Admitting: Cardiology

## 2017-01-30 ENCOUNTER — Ambulatory Visit (HOSPITAL_COMMUNITY): Payer: Medicare Other

## 2017-01-30 DIAGNOSIS — Z955 Presence of coronary angioplasty implant and graft: Secondary | ICD-10-CM | POA: Diagnosis not present

## 2017-01-30 DIAGNOSIS — Z7982 Long term (current) use of aspirin: Secondary | ICD-10-CM | POA: Diagnosis not present

## 2017-01-30 DIAGNOSIS — I110 Hypertensive heart disease with heart failure: Secondary | ICD-10-CM | POA: Diagnosis not present

## 2017-01-30 DIAGNOSIS — I5042 Chronic combined systolic (congestive) and diastolic (congestive) heart failure: Secondary | ICD-10-CM | POA: Diagnosis not present

## 2017-01-30 DIAGNOSIS — Z79899 Other long term (current) drug therapy: Secondary | ICD-10-CM | POA: Diagnosis not present

## 2017-01-30 DIAGNOSIS — I2510 Atherosclerotic heart disease of native coronary artery without angina pectoris: Secondary | ICD-10-CM | POA: Diagnosis not present

## 2017-02-02 ENCOUNTER — Encounter (HOSPITAL_COMMUNITY)
Admission: RE | Admit: 2017-02-02 | Discharge: 2017-02-02 | Disposition: A | Discharge status: Home / Self Care | Payer: Medicare Other | Source: Ambulatory Visit | Attending: Cardiology | Admitting: Cardiology

## 2017-02-02 ENCOUNTER — Ambulatory Visit (HOSPITAL_COMMUNITY): Payer: Medicare Other

## 2017-02-02 DIAGNOSIS — I5042 Chronic combined systolic (congestive) and diastolic (congestive) heart failure: Secondary | ICD-10-CM | POA: Diagnosis not present

## 2017-02-02 DIAGNOSIS — Z7982 Long term (current) use of aspirin: Secondary | ICD-10-CM | POA: Diagnosis not present

## 2017-02-02 DIAGNOSIS — I110 Hypertensive heart disease with heart failure: Secondary | ICD-10-CM | POA: Diagnosis not present

## 2017-02-02 DIAGNOSIS — Z955 Presence of coronary angioplasty implant and graft: Secondary | ICD-10-CM | POA: Diagnosis not present

## 2017-02-02 DIAGNOSIS — I2510 Atherosclerotic heart disease of native coronary artery without angina pectoris: Secondary | ICD-10-CM | POA: Diagnosis not present

## 2017-02-02 DIAGNOSIS — Z79899 Other long term (current) drug therapy: Secondary | ICD-10-CM | POA: Diagnosis not present

## 2017-02-02 LAB — GLUCOSE, CAPILLARY: Glucose-Capillary: 329 mg/dL — ABNORMAL HIGH (ref 65–99)

## 2017-02-03 ENCOUNTER — Telehealth (HOSPITAL_COMMUNITY): Payer: Self-pay | Admitting: *Deleted

## 2017-02-03 NOTE — Telephone Encounter (Signed)
If has another occurrence, will need to get a UA and probably see urology.

## 2017-02-03 NOTE — Telephone Encounter (Signed)
Pt aware and agreeable, he will let us know if this happens again

## 2017-02-03 NOTE — Telephone Encounter (Signed)
Pt called to report he had some blood in his urine this AM, he states it was not a lot and it did not hurt or burn to urinate.  He states it occurred the 1st time he urinated today and he has urinated several times since then and there has been no blood.  He is on Brilinta and Xarelto. Advised pt would let Dr Aundra Dubin know, he should continue to monitor and let us know if this happens again.

## 2017-02-04 ENCOUNTER — Ambulatory Visit (HOSPITAL_COMMUNITY): Payer: Medicare Other

## 2017-02-04 ENCOUNTER — Encounter (HOSPITAL_COMMUNITY)
Admission: RE | Admit: 2017-02-04 | Discharge: 2017-02-04 | Disposition: A | Discharge status: Home / Self Care | Payer: Medicare Other | Source: Ambulatory Visit | Attending: Cardiology | Admitting: Cardiology

## 2017-02-04 ENCOUNTER — Telehealth: Payer: Self-pay | Admitting: Pharmacist

## 2017-02-04 DIAGNOSIS — I2510 Atherosclerotic heart disease of native coronary artery without angina pectoris: Secondary | ICD-10-CM | POA: Diagnosis not present

## 2017-02-04 DIAGNOSIS — Z955 Presence of coronary angioplasty implant and graft: Secondary | ICD-10-CM

## 2017-02-04 DIAGNOSIS — Z7982 Long term (current) use of aspirin: Secondary | ICD-10-CM | POA: Diagnosis not present

## 2017-02-04 DIAGNOSIS — I110 Hypertensive heart disease with heart failure: Secondary | ICD-10-CM | POA: Diagnosis not present

## 2017-02-04 DIAGNOSIS — I5042 Chronic combined systolic (congestive) and diastolic (congestive) heart failure: Secondary | ICD-10-CM | POA: Diagnosis not present

## 2017-02-04 DIAGNOSIS — Z79899 Other long term (current) drug therapy: Secondary | ICD-10-CM | POA: Diagnosis not present

## 2017-02-04 NOTE — Telephone Encounter (Signed)
Patient presented today for teaching of Repatha pen. He demonstrated appropriate injection technique. Advised next dose of Repatha will be due 2 weeks (14 days) from today. He also requests to stop Pravastatin. Advised if able to tolerate at all he should continue on pravastatin and Repatha. He knows to call if symptoms becoming intolerable. He also states his blood sugars have been increasing recently and he is certain it is the medicines as he has been watching his diet. Advised to call if trend continues. He states understanding and appreciation.

## 2017-02-06 ENCOUNTER — Ambulatory Visit (HOSPITAL_COMMUNITY): Payer: Medicare Other

## 2017-02-06 ENCOUNTER — Encounter (HOSPITAL_COMMUNITY)
Admission: RE | Admit: 2017-02-06 | Discharge: 2017-02-06 | Disposition: A | Discharge status: Home / Self Care | Payer: Medicare Other | Source: Ambulatory Visit | Attending: Cardiology | Admitting: Cardiology

## 2017-02-06 DIAGNOSIS — Z79899 Other long term (current) drug therapy: Secondary | ICD-10-CM | POA: Diagnosis not present

## 2017-02-06 DIAGNOSIS — Z955 Presence of coronary angioplasty implant and graft: Secondary | ICD-10-CM

## 2017-02-06 DIAGNOSIS — I110 Hypertensive heart disease with heart failure: Secondary | ICD-10-CM | POA: Diagnosis not present

## 2017-02-06 DIAGNOSIS — Z7982 Long term (current) use of aspirin: Secondary | ICD-10-CM | POA: Diagnosis not present

## 2017-02-06 DIAGNOSIS — I5042 Chronic combined systolic (congestive) and diastolic (congestive) heart failure: Secondary | ICD-10-CM | POA: Diagnosis not present

## 2017-02-06 DIAGNOSIS — I2510 Atherosclerotic heart disease of native coronary artery without angina pectoris: Secondary | ICD-10-CM | POA: Diagnosis not present

## 2017-02-09 ENCOUNTER — Ambulatory Visit (HOSPITAL_COMMUNITY): Payer: Medicare Other

## 2017-02-09 ENCOUNTER — Encounter (HOSPITAL_COMMUNITY)
Admission: RE | Admit: 2017-02-09 | Discharge: 2017-02-09 | Disposition: A | Discharge status: Home / Self Care | Payer: Medicare Other | Source: Ambulatory Visit | Attending: Cardiology | Admitting: Cardiology

## 2017-02-09 DIAGNOSIS — Z955 Presence of coronary angioplasty implant and graft: Secondary | ICD-10-CM

## 2017-02-11 ENCOUNTER — Encounter (HOSPITAL_COMMUNITY)
Admission: RE | Admit: 2017-02-11 | Discharge: 2017-02-11 | Disposition: A | Discharge status: Home / Self Care | Payer: Medicare Other | Source: Ambulatory Visit | Attending: Cardiology | Admitting: Cardiology

## 2017-02-11 ENCOUNTER — Ambulatory Visit (HOSPITAL_COMMUNITY): Payer: Medicare Other

## 2017-02-11 ENCOUNTER — Emergency Department (HOSPITAL_COMMUNITY): Payer: Medicare Other

## 2017-02-11 ENCOUNTER — Encounter (HOSPITAL_COMMUNITY): Payer: Self-pay

## 2017-02-11 ENCOUNTER — Encounter (HOSPITAL_COMMUNITY): Payer: Self-pay | Admitting: *Deleted

## 2017-02-11 ENCOUNTER — Inpatient Hospital Stay (HOSPITAL_COMMUNITY)
Admission: EM | Admit: 2017-02-11 | Discharge: 2017-02-13 | DRG: 226 | Disposition: A | Discharge status: Home / Self Care | Payer: Medicare Other | Attending: Cardiology | Admitting: Cardiology

## 2017-02-11 DIAGNOSIS — I459 Conduction disorder, unspecified: Secondary | ICD-10-CM | POA: Diagnosis present

## 2017-02-11 DIAGNOSIS — Z4502 Encounter for adjustment and management of automatic implantable cardiac defibrillator: Secondary | ICD-10-CM | POA: Diagnosis not present

## 2017-02-11 DIAGNOSIS — E875 Hyperkalemia: Secondary | ICD-10-CM | POA: Diagnosis present

## 2017-02-11 DIAGNOSIS — G4733 Obstructive sleep apnea (adult) (pediatric): Secondary | ICD-10-CM | POA: Diagnosis present

## 2017-02-11 DIAGNOSIS — Z87891 Personal history of nicotine dependence: Secondary | ICD-10-CM

## 2017-02-11 DIAGNOSIS — Z8546 Personal history of malignant neoplasm of prostate: Secondary | ICD-10-CM | POA: Diagnosis not present

## 2017-02-11 DIAGNOSIS — E119 Type 2 diabetes mellitus without complications: Secondary | ICD-10-CM | POA: Diagnosis present

## 2017-02-11 DIAGNOSIS — W19XXXA Unspecified fall, initial encounter: Secondary | ICD-10-CM | POA: Diagnosis present

## 2017-02-11 DIAGNOSIS — I481 Persistent atrial fibrillation: Secondary | ICD-10-CM | POA: Diagnosis present

## 2017-02-11 DIAGNOSIS — Z7984 Long term (current) use of oral hypoglycemic drugs: Secondary | ICD-10-CM

## 2017-02-11 DIAGNOSIS — I130 Hypertensive heart and chronic kidney disease with heart failure and stage 1 through stage 4 chronic kidney disease, or unspecified chronic kidney disease: Secondary | ICD-10-CM | POA: Diagnosis not present

## 2017-02-11 DIAGNOSIS — R079 Chest pain, unspecified: Secondary | ICD-10-CM | POA: Diagnosis not present

## 2017-02-11 DIAGNOSIS — S0003XA Contusion of scalp, initial encounter: Secondary | ICD-10-CM | POA: Diagnosis present

## 2017-02-11 DIAGNOSIS — N179 Acute kidney failure, unspecified: Secondary | ICD-10-CM | POA: Diagnosis not present

## 2017-02-11 DIAGNOSIS — Z923 Personal history of irradiation: Secondary | ICD-10-CM | POA: Diagnosis not present

## 2017-02-11 DIAGNOSIS — E1140 Type 2 diabetes mellitus with diabetic neuropathy, unspecified: Secondary | ICD-10-CM | POA: Diagnosis present

## 2017-02-11 DIAGNOSIS — E669 Obesity, unspecified: Secondary | ICD-10-CM | POA: Diagnosis present

## 2017-02-11 DIAGNOSIS — E1122 Type 2 diabetes mellitus with diabetic chronic kidney disease: Secondary | ICD-10-CM | POA: Diagnosis present

## 2017-02-11 DIAGNOSIS — I5022 Chronic systolic (congestive) heart failure: Secondary | ICD-10-CM | POA: Diagnosis present

## 2017-02-11 DIAGNOSIS — I10 Essential (primary) hypertension: Secondary | ICD-10-CM | POA: Diagnosis present

## 2017-02-11 DIAGNOSIS — E785 Hyperlipidemia, unspecified: Secondary | ICD-10-CM | POA: Diagnosis present

## 2017-02-11 DIAGNOSIS — R0602 Shortness of breath: Secondary | ICD-10-CM | POA: Diagnosis not present

## 2017-02-11 DIAGNOSIS — I2510 Atherosclerotic heart disease of native coronary artery without angina pectoris: Secondary | ICD-10-CM | POA: Diagnosis present

## 2017-02-11 DIAGNOSIS — E1165 Type 2 diabetes mellitus with hyperglycemia: Secondary | ICD-10-CM | POA: Diagnosis present

## 2017-02-11 DIAGNOSIS — I480 Paroxysmal atrial fibrillation: Secondary | ICD-10-CM | POA: Diagnosis present

## 2017-02-11 DIAGNOSIS — J9811 Atelectasis: Secondary | ICD-10-CM | POA: Diagnosis not present

## 2017-02-11 DIAGNOSIS — K219 Gastro-esophageal reflux disease without esophagitis: Secondary | ICD-10-CM | POA: Diagnosis present

## 2017-02-11 DIAGNOSIS — Z7902 Long term (current) use of antithrombotics/antiplatelets: Secondary | ICD-10-CM

## 2017-02-11 DIAGNOSIS — R55 Syncope and collapse: Secondary | ICD-10-CM | POA: Diagnosis not present

## 2017-02-11 DIAGNOSIS — N183 Chronic kidney disease, stage 3 (moderate): Secondary | ICD-10-CM | POA: Diagnosis not present

## 2017-02-11 DIAGNOSIS — Z79899 Other long term (current) drug therapy: Secondary | ICD-10-CM

## 2017-02-11 DIAGNOSIS — I255 Ischemic cardiomyopathy: Principal | ICD-10-CM | POA: Diagnosis present

## 2017-02-11 DIAGNOSIS — Z6834 Body mass index (BMI) 34.0-34.9, adult: Secondary | ICD-10-CM

## 2017-02-11 DIAGNOSIS — Z955 Presence of coronary angioplasty implant and graft: Secondary | ICD-10-CM

## 2017-02-11 DIAGNOSIS — Z881 Allergy status to other antibiotic agents status: Secondary | ICD-10-CM

## 2017-02-11 DIAGNOSIS — I462 Cardiac arrest due to underlying cardiac condition: Secondary | ICD-10-CM | POA: Diagnosis present

## 2017-02-11 DIAGNOSIS — I469 Cardiac arrest, cause unspecified: Secondary | ICD-10-CM | POA: Diagnosis not present

## 2017-02-11 DIAGNOSIS — I472 Ventricular tachycardia: Secondary | ICD-10-CM

## 2017-02-11 DIAGNOSIS — Z95810 Presence of automatic (implantable) cardiac defibrillator: Secondary | ICD-10-CM

## 2017-02-11 DIAGNOSIS — I252 Old myocardial infarction: Secondary | ICD-10-CM

## 2017-02-11 DIAGNOSIS — IMO0002 Reserved for concepts with insufficient information to code with codable children: Secondary | ICD-10-CM | POA: Diagnosis present

## 2017-02-11 DIAGNOSIS — Z888 Allergy status to other drugs, medicaments and biological substances status: Secondary | ICD-10-CM

## 2017-02-11 DIAGNOSIS — Z7901 Long term (current) use of anticoagulants: Secondary | ICD-10-CM

## 2017-02-11 DIAGNOSIS — Z8249 Family history of ischemic heart disease and other diseases of the circulatory system: Secondary | ICD-10-CM

## 2017-02-11 LAB — CBC
HCT: 40.4 % (ref 39.0–52.0)
HEMOGLOBIN: 13.4 g/dL (ref 13.0–17.0)
MCH: 28.8 pg (ref 26.0–34.0)
MCHC: 33.2 g/dL (ref 30.0–36.0)
MCV: 86.7 fL (ref 78.0–100.0)
Platelets: 192 10*3/uL (ref 150–400)
RBC: 4.66 MIL/uL (ref 4.22–5.81)
RDW: 14.7 % (ref 11.5–15.5)
WBC: 10.1 10*3/uL (ref 4.0–10.5)

## 2017-02-11 LAB — CBG MONITORING, ED
GLUCOSE-CAPILLARY: 312 mg/dL — AB (ref 65–99)
Glucose-Capillary: 479 mg/dL — ABNORMAL HIGH (ref 65–99)

## 2017-02-11 LAB — COMPREHENSIVE METABOLIC PANEL
ALK PHOS: 68 U/L (ref 38–126)
ALT: 26 U/L (ref 17–63)
AST: 27 U/L (ref 15–41)
Albumin: 4 g/dL (ref 3.5–5.0)
Anion gap: 14 (ref 5–15)
BUN: 45 mg/dL — AB (ref 6–20)
CALCIUM: 9.6 mg/dL (ref 8.9–10.3)
CO2: 25 mmol/L (ref 22–32)
CREATININE: 1.84 mg/dL — AB (ref 0.61–1.24)
Chloride: 93 mmol/L — ABNORMAL LOW (ref 101–111)
GFR calc non Af Amer: 35 mL/min — ABNORMAL LOW (ref >60)
GFR, EST AFRICAN AMERICAN: 40 mL/min — AB (ref >60)
GLUCOSE: 437 mg/dL — AB (ref 65–99)
Potassium: 5.4 mmol/L — ABNORMAL HIGH (ref 3.5–5.1)
SODIUM: 132 mmol/L — AB (ref 135–145)
Total Bilirubin: 0.9 mg/dL (ref 0.3–1.2)
Total Protein: 7 g/dL (ref 6.5–8.1)

## 2017-02-11 LAB — MAGNESIUM: Magnesium: 2.5 mg/dL — ABNORMAL HIGH (ref 1.7–2.4)

## 2017-02-11 LAB — I-STAT TROPONIN, ED: TROPONIN I, POC: 0.04 ng/mL (ref 0.00–0.08)

## 2017-02-11 LAB — MRSA PCR SCREENING: MRSA by PCR: NEGATIVE

## 2017-02-11 LAB — GLUCOSE, CAPILLARY: Glucose-Capillary: 209 mg/dL — ABNORMAL HIGH (ref 65–99)

## 2017-02-11 LAB — DIGOXIN LEVEL: Digoxin Level: 1.3 ng/mL (ref 0.8–2.0)

## 2017-02-11 LAB — TROPONIN I: TROPONIN I: 0.1 ng/mL — AB (ref <0.03)

## 2017-02-11 MED ORDER — GLIMEPIRIDE 4 MG PO TABS
2.0000 mg | ORAL_TABLET | Freq: Every day | ORAL | Status: DC
  Administered 2017-02-13: 2 mg via ORAL
  Filled 2017-02-11: qty 1

## 2017-02-11 MED ORDER — ADULT MULTIVITAMIN W/MINERALS CH
1.0000 | ORAL_TABLET | Freq: Every day | ORAL | Status: DC
  Administered 2017-02-12 – 2017-02-13 (×2): 1 via ORAL
  Filled 2017-02-11 (×2): qty 1

## 2017-02-11 MED ORDER — LOSARTAN POTASSIUM 25 MG PO TABS
25.0000 mg | ORAL_TABLET | Freq: Two times a day (BID) | ORAL | Status: DC
  Administered 2017-02-11: 25 mg via ORAL
  Filled 2017-02-11 (×2): qty 1

## 2017-02-11 MED ORDER — HYDROMORPHONE HCL 1 MG/ML IJ SOLN
0.5000 mg | Freq: Once | INTRAMUSCULAR | Status: AC
  Administered 2017-02-12: 0.5 mg via INTRAVENOUS
  Filled 2017-02-11: qty 1

## 2017-02-11 MED ORDER — SODIUM CHLORIDE 0.9% FLUSH
3.0000 mL | Freq: Two times a day (BID) | INTRAVENOUS | Status: DC
  Administered 2017-02-11 – 2017-02-13 (×3): 3 mL via INTRAVENOUS

## 2017-02-11 MED ORDER — TICAGRELOR 60 MG PO TABS
60.0000 mg | ORAL_TABLET | Freq: Two times a day (BID) | ORAL | Status: DC
  Administered 2017-02-11 – 2017-02-13 (×4): 60 mg via ORAL
  Filled 2017-02-11 (×4): qty 1

## 2017-02-11 MED ORDER — ONDANSETRON HCL 4 MG/2ML IJ SOLN
INTRAMUSCULAR | Status: AC
  Administered 2017-02-11: 4 mg
  Filled 2017-02-11: qty 2

## 2017-02-11 MED ORDER — SODIUM CHLORIDE 0.9 % IV SOLN: 250.0000 mL | INTRAVENOUS | Status: DC | PRN

## 2017-02-11 MED ORDER — INSULIN ASPART 100 UNIT/ML ~~LOC~~ SOLN
0.0000 [IU] | Freq: Three times a day (TID) | SUBCUTANEOUS | Status: DC
  Administered 2017-02-11: 11 [IU] via SUBCUTANEOUS
  Administered 2017-02-12 (×2): 8 [IU] via SUBCUTANEOUS
  Administered 2017-02-13: 2 [IU] via SUBCUTANEOUS
  Filled 2017-02-11: qty 1

## 2017-02-11 MED ORDER — FUROSEMIDE 80 MG PO TABS
80.0000 mg | ORAL_TABLET | Freq: Every day | ORAL | Status: DC
  Administered 2017-02-11: 80 mg via ORAL
  Filled 2017-02-11: qty 4

## 2017-02-11 MED ORDER — ONDANSETRON HCL 4 MG/2ML IJ SOLN: 4.0000 mg | Freq: Four times a day (QID) | INTRAMUSCULAR | Status: DC | PRN

## 2017-02-11 MED ORDER — SPIRONOLACTONE 25 MG PO TABS: 25.0000 mg | ORAL_TABLET | Freq: Every day | ORAL | Status: DC

## 2017-02-11 MED ORDER — DIGOXIN 125 MCG PO TABS
0.1250 mg | ORAL_TABLET | Freq: Every day | ORAL | Status: DC
  Administered 2017-02-11: 0.125 mg via ORAL
  Filled 2017-02-11: qty 1

## 2017-02-11 MED ORDER — CENTRUM SILVER ADULT 50+ PO TABS: 1.0000 | ORAL_TABLET | Freq: Every day | ORAL | Status: DC

## 2017-02-11 MED ORDER — SODIUM CHLORIDE 0.9% FLUSH: 3.0000 mL | INTRAVENOUS | Status: DC | PRN

## 2017-02-11 MED ORDER — DIPHENHYDRAMINE-APAP (SLEEP) 25-500 MG PO TABS: 1.0000 | ORAL_TABLET | Freq: Every evening | ORAL | Status: DC | PRN

## 2017-02-11 MED ORDER — FUROSEMIDE 80 MG PO TABS: 80.0000 mg | ORAL_TABLET | Freq: Every day | ORAL | Status: DC

## 2017-02-11 MED ORDER — DIGOXIN 125 MCG PO TABS: 0.1250 mg | ORAL_TABLET | Freq: Every day | ORAL | Status: DC

## 2017-02-11 MED ORDER — CARVEDILOL 6.25 MG PO TABS
6.2500 mg | ORAL_TABLET | Freq: Two times a day (BID) | ORAL | Status: DC
  Administered 2017-02-11 – 2017-02-13 (×4): 6.25 mg via ORAL
  Filled 2017-02-11 (×4): qty 1

## 2017-02-11 MED ORDER — INSULIN ASPART 100 UNIT/ML ~~LOC~~ SOLN
0.0000 [IU] | Freq: Every day | SUBCUTANEOUS | Status: DC
  Administered 2017-02-11 – 2017-02-12 (×2): 2 [IU] via SUBCUTANEOUS

## 2017-02-11 MED ORDER — AMIODARONE HCL 200 MG PO TABS
200.0000 mg | ORAL_TABLET | Freq: Two times a day (BID) | ORAL | Status: DC
  Administered 2017-02-11 – 2017-02-13 (×4): 200 mg via ORAL
  Filled 2017-02-11 (×4): qty 1

## 2017-02-11 MED ORDER — ACETAMINOPHEN 325 MG PO TABS: 650.0000 mg | ORAL_TABLET | ORAL | Status: DC | PRN

## 2017-02-11 MED ORDER — PRAVASTATIN SODIUM 40 MG PO TABS
20.0000 mg | ORAL_TABLET | Freq: Every evening | ORAL | Status: DC
  Administered 2017-02-11 – 2017-02-12 (×2): 20 mg via ORAL
  Filled 2017-02-11 (×2): qty 1

## 2017-02-11 MED FILL — Medication: Qty: 1 | Status: AC

## 2017-02-11 NOTE — ED Notes (Signed)
Patient presents to ed from cardiac rehab. Patient states he had a cardiac cath in Dec. And had a stent placed

## 2017-02-11 NOTE — ED Notes (Signed)
Ice pack applied to head

## 2017-02-11 NOTE — ED Notes (Signed)
C/o nausea order obtained from Dr. Leonette Monarch for Zofran.

## 2017-02-11 NOTE — Significant Event (Addendum)
Rapid Response Event Note  Overview:  Called to cardiac rehab by code blue pager Time Called: 1043 Arrival Time: [id number] Event Type: Cardiac  Initial Focused Assessment:  On arrival patient supine on floor in front of cardiac rehab unit - alert - warm and diaphoretic - cardiac rehab staff present - staff reports patient was on his way to rehab- bystander reports he suddenly slumped up next to wall - was lowered to floor - became unresponsive - was shocked by his Life Vest.  Patient now alert - denies pain - does not remember event except that he suddenly felt dizzy.   Interventions:  Placed on Zoll monitor - SR noted 85 - strong pulse - Life Vest remains  intact - IV started per IV team - patient placed on stretcher - taken to ED with Code team including Code team MD - tol transfer - complains only of soreness in chest from shock.  Remains alert and talkative.  Hematoma noted right side of scalp - small abrasion noted.  Handoff to [facility] and Dr. Leonette Monarch ED staff.  Cardiac rehab team contacting wife.  See also Code Blue sheet.  Plan of Care (if not transferred):  Event Summary: Name of Physician Notified: Code blue team MD at      at    Outcome: Other (Comment) (To ED)  Event End Time: 1115  Quin Hoop

## 2017-02-11 NOTE — Progress Notes (Signed)
Rehab staff alerted that someone had passed out in the hallway leading into the cardiac rehab department.  Pt found on the floor unresponsive on the floor in a puddle of water with life vest alarming. Advised that a [employer] employee assisted pt to the floor. Cpr started  Code blue called. Life vest advised that shock was warranted.  Patient cleared, live vest shocked patient.  Pt became aroused and able to verbalized feeling dizzy prior to and did not remember anything else happening. Code team on the scene.  bp 1608/90. Monitor shows SR.  IV placed, pt assisted onto stretcher, belongings secured.  Patient and code team to ER.  Pt asked for his wife edith to be called. Called and spoke to his wife to update her of this mornings event. Dr. Aundra Dubin office notified that pt defibrillator fired and pt was on his way to the ER for further treatment and evaluation. Cherre Huger, BSN

## 2017-02-11 NOTE — ED Provider Notes (Signed)
[facility] DEPT Provider Note   CSN: [id number] Arrival date & time: 02/11/17  1052     History   Chief Complaint Chief Complaint  Patient presents with  . Cardiac Arrest    HPI ARCADIO CRUTE is a 76 y.o. male.  HPI  76 year old male with a history of CHF with an EF of 15% who has a life vest on presents to ED after syncopal episode while at cardiac rehabilitation. Bystanders report patient was shocked by the life fast. Responded after a single shock. Patient regained consciousness shortly after. No additional shocks were administered. Patient reports feeling lightheaded prior to the incident however denied any chest pain, shortness of breath, diaphoresis. Unable to provide any additional details surrounding the incident. Denies any recent fevers, illnesses, infections. No other physical complaints at this time.  Past Medical History:  Diagnosis Date  . Adenocarcinoma of prostate ([place])    s/p seed implants  . Arthritis   . Cellulitis of left leg    a. 0000000 complicated by septic shock  . Chronic combined systolic and diastolic CHF (congestive heart failure) ([place])   . Colon polyps   . CORONARY ATHEROSCLEROSIS NATIVE CORONARY ARTERY    a. 01/2011 Cath/PCI: LM nl, LAD 40-50p, D1 80-small, LCX 95-small, RI 90, RCA 100, EF 20%;  b. 01/2011 Card MRI - No transmural scar;  c. 01/2011 PCI RCA->5 Promus DES, RI->3.0x16 Promus DES; d. Cath 01/13/13 patent LAD & Ramus, diffuse LCx dz, RCA mult overlapping stents w/ 95% osital stenosis, EF 20%, s/p DES to ostial/prox RCA 01/24/13   . Diabetes mellitus, type 2 ([place])   . GERD (gastroesophageal reflux disease)   . Hematoma of leg    a. left leg hematoma 03/2012 in the setting of asa/effient  . Herpes zoster ophthalmicus   . HYPERLIPIDEMIA    intolerant to Lipitor (myalgias)  . HYPERTENSION   . Ischemic cardiomyopathy    a. 01/2012 Echo EF 45%, mild LVH; b. [id number], grade 1 diastolic dysfunction, diffuse hypokinesis, inferoposterior akinesis     . Noncompliance   . Obesity   . OSA (obstructive sleep apnea)   . Polymyalgia rheumatica [facility])     Patient Active Problem List   Diagnosis Date Noted  . Defibrillator discharge 02/11/2017  . Atrial fibrillation with rapid ventricular response ([place]) 01/16/2017  . Coronary stent occlusion 11/18/2016  . Type 2 diabetes mellitus, uncontrolled ([place]) 09/30/2016  . CKD (chronic kidney disease) stage 3, GFR 30-59 ml/min 09/30/2016  . CAD (coronary artery disease) 11/20/2015  . Leucocytoclastic vasculitis ([place]) 06/25/2012  . AKI (acute kidney injury) ([place]) 06/22/2012  . Staphylococcus aureus bacteremia with sepsis ([place]) 06/18/2012  . NSTEMI, initial episode of care ([place]) 06/16/2012  . Cellulitis and abscess of lower leg 06/14/2012    Class: Acute  . Healthcare-associated pneumonia 06/14/2012    Class: Acute  . Hyperglycemia 06/14/2012    Class: Acute  . Leg swelling 04/22/2012  . Obesity 01/08/2012  . OSA (obstructive sleep apnea) 09/02/2011  . Systolic CHF, chronic ([place]) 06/10/2011  . Polymyalgia rheumatica ([place]) 06/02/2011  . HEMATOCHEZIA 02/10/2011  . BURSITIS, LEFT HIP 02/10/2011  . CORONARY ATHEROSCLEROSIS NATIVE CORONARY ARTERY 01/27/2011  . CARDIOMYOPATHY 01/21/2011  . CHEST PAIN UNSPECIFIED 01/20/2011  . DYSPNEA 01/16/2011  . ABDOMINAL PAIN, UNSPECIFIED SITE 01/13/2011  . ECZEMA 01/28/2010  . BRONCHITIS, ACUTE WITH MILD BRONCHOSPASM 12/21/2009  . HERPES ZOSTER OPHTHALMICUS 07/19/2009  . ADENOCARCINOMA, PROSTATE 06/20/2009  . Secondary DM with peripheral vascular disease, uncontrolled ([place]) 06/20/2009  .  Hyperlipidemia 06/20/2009  . Essential hypertension 06/20/2009  . HYPERTROPHY PROSTATE W/UR OBST & OTH LUTS 06/20/2009    Past Surgical History:  Procedure Laterality Date  . CARDIAC CATHETERIZATION  01/24/2013  . CARDIAC CATHETERIZATION N/A 11/14/2016   Procedure: Right/Left Heart Cath and Coronary Angiography;  Surgeon: Larey Dresser, MD;  Location: [facility] CV  LAB;  Service: Cardiovascular;  Laterality: N/A;  . CARDIAC CATHETERIZATION N/A 11/17/2016   Procedure: Coronary Stent Intervention w/Impella;  Surgeon: Peter M Martinique, MD;  Location: [facility] CV LAB;  Service: Cardiovascular;  Laterality: N/A;  . CARDIAC CATHETERIZATION N/A 11/17/2016   Procedure: Coronary Atherectomy;  Surgeon: Peter M Martinique, MD;  Location: [facility] CV LAB;  Service: Cardiovascular;  Laterality: N/A;  . CORONARY ANGIOPLASTY WITH STENT PLACEMENT  01/24/2013   DES to RCA  . CORONARY STENT PLACEMENT  2012   reports 6 stents placed  . I&D EXTREMITY  06/15/2012   Procedure: IRRIGATION AND DEBRIDEMENT EXTREMITY;  Surgeon: Newt Minion, MD;  Location: [facility];  Service: Orthopedics;  Laterality: Left;  I&D Left Posterior Knee  . I&D EXTREMITY  06/30/2012   Procedure: IRRIGATION AND DEBRIDEMENT EXTREMITY;  Surgeon: Newt Minion, MD;  Location: [facility];  Service: Orthopedics;  Laterality: Left;  Left Leg Irrigation and Debridement and placement of Wound VAC and application of  A-cell  . I&D EXTREMITY  07/20/2012   Procedure: IRRIGATION AND DEBRIDEMENT EXTREMITY;  Surgeon: Newt Minion, MD;  Location: [facility];  Service: Orthopedics;  Laterality: Left;  Irrigation and Debridement Left Leg and Place antibiotic beads   . PERCUTANEOUS CORONARY STENT INTERVENTION (PCI-S) N/A 01/24/2013   Procedure: PERCUTANEOUS CORONARY STENT INTERVENTION (PCI-S);  Surgeon: Wellington Hampshire, MD;  Location: [facility] CATH LAB;  Service: Cardiovascular;  Laterality: N/A;  . PROSTATE SURGERY     cancer, seed implant  . ULTRASOUND GUIDANCE FOR VASCULAR ACCESS  11/14/2016   Procedure: Ultrasound Guidance For Vascular Access;  Surgeon: Larey Dresser, MD;  Location: [facility] CV LAB;  Service: Cardiovascular;;       Home Medications    Prior to Admission medications   Medication Sig Start Date End Date Taking? Authorizing Provider  acetaminophen (TYLENOL) 500 MG tablet Take 500 mg by mouth daily as needed for  moderate pain or headache.   Yes Historical Provider, MD  amiodarone (PACERONE) 200 MG tablet 2 tabs bid x 1 week, then 1 tab BID x 1 week, then 1 tab daily 01/17/17  Yes Rogelia Mire, NP  carvedilol (COREG) 6.25 MG tablet Take 1 tablet (6.25 mg total) by mouth 2 (two) times daily. 01/23/17 04/23/17 Yes Larey Dresser, MD  digoxin (LANOXIN) 0.125 MG tablet Take 1 tablet (0.125 mg total) by mouth daily. 11/21/16  Yes Satira Mccallum Tillery, PA-C  diphenhydramine-acetaminophen (TYLENOL PM) 25-500 MG TABS tablet Take 1 tablet by mouth at bedtime as needed (sleep).   Yes Historical Provider, MD  furosemide (LASIX) 20 MG tablet Take by mouth. 80 mg in the AM and 60 mg in the PM   Yes Historical Provider, MD  glimepiride (AMARYL) 2 MG tablet Take 1 tablet (2 mg total) by mouth daily before breakfast. 10/31/16  Yes [name], MD  glucose blood (ACCU-CHEK AVIVA) test strip Check blood sugars 1-2 times per day. DX: E11.65 09/30/16  Yes [name], MD  Lancets (ACCU-CHEK SOFT TOUCH) lancets Check blood sugars 1-2 times per day. DX: E11.65 09/30/16  Yes [name], MD  losartan (  COZAAR) 25 MG tablet Take 1 tablet (25 mg total) by mouth 2 (two) times daily. 12/23/16  Yes Larey Dresser, MD  Multiple Vitamins-Minerals (CENTRUM SILVER ADULT 50+) TABS Take 1 tablet by mouth daily.   Yes Historical Provider, MD  pravastatin (PRAVACHOL) 20 MG tablet Take 1 tablet (20 mg total) by mouth every evening. 01/23/17 04/23/17 Yes Larey Dresser, MD  Rivaroxaban (XARELTO) 15 MG TABS tablet Take 1 tablet (15 mg total) by mouth daily. 01/18/17  Yes Rogelia Mire, NP  spironolactone (ALDACTONE) 25 MG tablet Take 1 tablet (25 mg total) by mouth daily. 12/03/16  Yes Amy D Clegg, NP  ticagrelor (BRILINTA) 60 MG TABS tablet Take 1 tablet (60 mg total) by mouth 2 (two) times daily. 01/17/17  Yes Rogelia Mire, NP  Evolocumab (REPATHA SURECLICK) [id number] MG/ML SOAJ Inject 1 pen into the skin every 14 (fourteen)  days.    Historical Provider, MD    Family History Family History  Problem Relation Age of Onset  . Cancer Mother 41    unknown CA  . Heart disease Father   . Alcohol abuse Father   . Heart attack Father 32    Social History Social History  Substance Use Topics  . Smoking status: Former Smoker    Packs/day: 0.30    Years: 20.00    Types: Cigarettes    Quit date: 02/23/1989  . Smokeless tobacco: Never Used  . Alcohol use No     Allergies   Plavix [clopidogrel]; Lipitor [atorvastatin]; Crestor [rosuvastatin calcium]; Entresto [sacubitril-valsartan]; and Ancef [cefazolin]   Review of Systems Review of Systems Ten systems are reviewed and are negative for acute change except as noted in the HPI   Physical Exam Updated Vital Signs BP 120/64   Pulse 74   Temp 97.8 F (36.6 C) (Oral)   Resp 16   SpO2 98%   Physical Exam  Constitutional: He is oriented to person, place, and time. He appears well-developed and well-nourished. No distress.  HENT:  Head: Normocephalic and atraumatic.  Nose: Nose normal.  Eyes: Conjunctivae and EOM are normal. Pupils are equal, round, and reactive to light. Right eye exhibits no discharge. Left eye exhibits no discharge. No scleral icterus.  Neck: Normal range of motion. Neck supple.  Cardiovascular: Normal rate and regular rhythm.  Exam reveals no gallop and no friction rub.   No murmur heard. Pulmonary/Chest: Effort normal and breath sounds normal. No stridor. No respiratory distress. He has no rales.  LifeVest in place  Abdominal: Soft. He exhibits no distension. There is no tenderness.  Musculoskeletal: He exhibits no edema or tenderness.  Neurological: He is alert and oriented to person, place, and time.  Skin: Skin is warm and dry. No rash noted. He is not diaphoretic. No erythema.  Psychiatric: He has a normal mood and affect.  Vitals reviewed.    ED Treatments / Results  Labs (all labs ordered are listed, but only abnormal  results are displayed) Labs Reviewed  COMPREHENSIVE METABOLIC PANEL - Abnormal; Notable for the following:       Result Value   Sodium 132 (*)    Potassium 5.4 (*)    Chloride 93 (*)    Glucose, Bld 437 (*)    BUN 45 (*)    Creatinine, Ser 1.84 (*)    GFR calc non Af Amer 35 (*)    GFR calc Af Amer 40 (*)    All other components within normal limits  MAGNESIUM -  Abnormal; Notable for the following:    Magnesium 2.5 (*)    All other components within normal limits  CBG MONITORING, ED - Abnormal; Notable for the following:    Glucose-Capillary 479 (*)    All other components within normal limits  CBG MONITORING, ED - Abnormal; Notable for the following:    Glucose-Capillary 312 (*)    All other components within normal limits  CBC  DIGOXIN LEVEL  I-STAT TROPOININ, ED    EKG  EKG Interpretation None       Radiology Ct Head Wo Contrast  Result Date: 02/11/2017 CLINICAL DATA:  Posttraumatic headache after fall. No reported loss of consciousness. EXAM: CT HEAD WITHOUT CONTRAST TECHNIQUE: Contiguous axial images were obtained from the base of the skull through the vertex without intravenous contrast. COMPARISON:  None. FINDINGS: Brain: No evidence of acute infarction, hemorrhage, hydrocephalus, extra-axial collection or mass lesion/mass effect. Vascular: No hyperdense vessel or unexpected calcification. Skull: Normal. Negative for fracture or focal lesion. Sinuses/Orbits: No acute finding. Other: Small right posterior parietal scalp hematoma is noted. IMPRESSION: Small right posterior parietal scalp hematoma. No acute intracranial abnormality seen. Electronically Signed   By: Marijo Conception, M.D.   On: 02/11/2017 13:37   Dg Chest Port 1 View  Result Date: 02/11/2017 CLINICAL DATA:  Chest pain and shortness of breath. EXAM: PORTABLE CHEST 1 VIEW COMPARISON:  01/16/2017. FINDINGS: Low lung volumes accentuate the heart size, which is probably increased. Prior LEFT base subsegmental  atelectasis poorly visualized due to overlying telemetry leads and apparatus. No active infiltrates or failure. IMPRESSION: Limited exam, due to overlying telemetry leads and equipment. No active infiltrates or failure. Electronically Signed   By: Staci Righter M.D.   On: 02/11/2017 12:27    Procedures Procedures (including critical care time) CRITICAL CARE Performed by: Grayce Sessions Cardama Total critical care time: 30 minutes Critical care time was exclusive of separately billable procedures and treating other patients. Critical care was necessary to treat or prevent imminent or life-threatening deterioration. Critical care was time spent personally by me on the following activities: development of treatment plan with patient and/or surrogate as well as nursing, discussions with consultants, evaluation of patient's response to treatment, examination of patient, obtaining history from patient or surrogate, ordering and performing treatments and interventions, ordering and review of laboratory studies, ordering and review of radiographic studies, pulse oximetry and re-evaluation of patient's condition.   Medications Ordered in ED Medications  carvedilol (COREG) tablet 6.25 mg (not administered)  pravastatin (PRAVACHOL) tablet 20 mg (not administered)  ticagrelor (BRILINTA) tablet 60 mg (not administered)  losartan (COZAAR) tablet 25 mg (not administered)  spironolactone (ALDACTONE) tablet 25 mg (not administered)  digoxin (LANOXIN) tablet 0.125 mg (not administered)  glimepiride (AMARYL) tablet 2 mg (not administered)  furosemide (LASIX) tablet 80 mg (not administered)  insulin aspart (novoLOG) injection 0-15 Units (not administered)  insulin aspart (novoLOG) injection 0-5 Units (not administered)  amiodarone (PACERONE) tablet 200 mg (not administered)  ondansetron (ZOFRAN) 4 MG/2ML injection (4 mg  Given 02/11/17 1246)     Initial Impression / Assessment and Plan / ED Course  I have  reviewed the triage vital signs and the nursing notes.  Pertinent labs & imaging results that were available during my care of the patient were reviewed by me and considered in my medical decision making (see chart for details).     Suspected cardiac VT/V. fib arrest. Currently hemodynamically stable. LifeVest removed and patient placed on defibrillator pads. Cardiology consultation  who will evaluate the LifeVest. Patient admitted to cardiology for further workup and management.  Final Clinical Impressions(s) / ED Diagnoses   Final diagnoses:  Syncope, unspecified syncope type      Fatima Blank, MD 02/11/17 [phone]

## 2017-02-11 NOTE — ED Notes (Signed)
  CBG : 312 

## 2017-02-11 NOTE — ED Notes (Signed)
Attempted report 

## 2017-02-11 NOTE — Progress Notes (Signed)
Troponin 0.10 was called by lab at 2045. Cardiology notified.

## 2017-02-11 NOTE — ED Notes (Signed)
Patient had a life vest applied, states his heart has been " out of rhythm" before however  He was never shocked. Today he drove himself to rehab was at the rehab door and felt very dizzy, someone helped lower him to the floor, his life vest shocked him x 1. Up  Upon  Arrival to ed patient is alert oriented c/o chest soreness from the shock. Denies sob . Patient does have a small hematoma to his head and small abrasion to left forehead. Wife at bedside. Patient states his CBG was 196 this am, presently cbg 479

## 2017-02-11 NOTE — ED Notes (Signed)
Returned from CT.

## 2017-02-11 NOTE — Code Documentation (Signed)
  Patient Name: FALLON SALAIZ   MRN: [id number]   Date of Birth/ Sex: 1941-08-20 , male      Admission Date: 02/11/2017  Attending Provider: No att. providers found  Primary Diagnosis: <principal problem not specified>   Indication: Pt was in his usual state of health until this AM, when he was found to be unresponsive. He was walking into Cardiac Rehab when he developed diaphoresis, dizziness and had lost consciousness; he was helped to the ground by a bystander. Code blue was subsequently called. At the time of arrival on scene, ACLS protocol was underway.   Technical Description:  - CPR performance duration:  0 minute  - Was defibrillation or cardioversion used? Patient's life vest activated   - Was external pacer placed? No; life vest  - Was patient intubated pre/post CPR? No   Medications Administered: Y = Yes; Blank = No Amiodarone    Atropine    Calcium    Epinephrine    Lidocaine    Magnesium    Norepinephrine    Phenylephrine    Sodium bicarbonate    Vasopressin     Post CPR evaluation:  - Final Status - Was patient successfully resuscitated ? Yes - What is current rhythm? sinus - What is current hemodynamic status? guarded  Miscellaneous Information:  - Labs sent, including: none  - Primary team notified?  Patient not inpatient; he was transferred to the ED with Code Team and handoff occurred with ED provider (Dr. Leonette Monarch) and nursing staff.  - Family Notified? Rehab will be contacting family  - Additional notes/ transfer status: Transferred to ED.     Alphonzo Grieve, MD  02/11/2017, 10:57 AM

## 2017-02-11 NOTE — ED Notes (Signed)
Pt eating dinner tray °

## 2017-02-11 NOTE — ED Notes (Signed)
Life vest  Removed per Cards PA def pads applied.

## 2017-02-11 NOTE — Progress Notes (Signed)
Cardiac Individual Treatment Plan  Patient Details  Name: Wayne [name] MRN: [id number] Date of Birth: [birth year] Referring Provider:   Flowsheet Row CARDIAC REHAB PHASE II ORIENTATION from 12/16/2016 in [place]  Referring Provider  Loralie Champagne, MD      Initial Encounter Date:  [place] PHASE II ORIENTATION from 12/16/2016 in [place]  Date  12/16/16  Referring Provider  Loralie Champagne, MD      Visit Diagnosis: No diagnosis found.  Patient's Home Medications on Admission: No current facility-administered medications for this encounter.  No current outpatient prescriptions on file.  Facility-Administered Medications Ordered in Other Encounters:  .  0.9 %  sodium chloride infusion, 250 mL, Intravenous, PRN, Satira Mccallum Tillery, PA-C .  acetaminophen (TYLENOL) tablet 650 mg, 650 mg, Oral, Q4H PRN, Satira Mccallum Tillery, PA-C .  amiodarone (PACERONE) tablet 200 mg, 200 mg, Oral, BID, Satira Mccallum Tillery, PA-C .  carvedilol (COREG) tablet 6.25 mg, 6.25 mg, Oral, BID, Satira Mccallum Tillery, PA-C .  Derrill Memo ON 02/12/2017] digoxin (LANOXIN) tablet 0.125 mg, 0.125 mg, Oral, Daily, Larey Dresser, MD .  Derrill Memo ON 02/12/2017] furosemide (LASIX) tablet 80 mg, 80 mg, Oral, Daily, Larey Dresser, MD .  Derrill Memo ON 02/12/2017] glimepiride (AMARYL) tablet 2 mg, 2 mg, Oral, QAC breakfast, Satira Mccallum Tillery, PA-C .  insulin aspart (novoLOG) injection 0-15 Units, 0-15 Units, Subcutaneous, TID WC, Shirley Friar, PA-C, 11 Units at 02/11/17 1638 .  insulin aspart (novoLOG) injection 0-5 Units, 0-5 Units, Subcutaneous, QHS, Satira Mccallum Tillery, PA-C .  losartan (COZAAR) tablet 25 mg, 25 mg, Oral, BID, Shirley Friar, PA-C .  Derrill Memo ON 02/12/2017] multivitamin with minerals tablet 1 tablet, 1 tablet, Oral, Daily, Rachel L Rumbarger, RPH .  ondansetron (ZOFRAN) injection 4 mg, 4 mg, Intravenous,  Q6H PRN, Satira Mccallum Tillery, PA-C .  pravastatin (PRAVACHOL) tablet 20 mg, 20 mg, Oral, QPM, Satira Mccallum Tillery, PA-C .  sodium chloride flush (NS) 0.9 % injection 3 mL, 3 mL, Intravenous, Q12H, Satira Mccallum Tillery, PA-C .  sodium chloride flush (NS) 0.9 % injection 3 mL, 3 mL, Intravenous, PRN, Shirley Friar, PA-C .  Derrill Memo ON 02/12/2017] spironolactone (ALDACTONE) tablet 25 mg, 25 mg, Oral, Daily, Satira Mccallum Tillery, PA-C .  ticagrelor [facility]) tablet 60 mg, 60 mg, Oral, BID, Shirley Friar, PA-C  Past Medical History: Past Medical History:  Diagnosis Date  . Adenocarcinoma of prostate ([place])    s/p seed implants  . Arthritis   . Cellulitis of left leg    a. 06/9023 complicated by septic shock  . Chronic combined systolic and diastolic CHF (congestive heart failure) ([place])   . Colon polyps   . CORONARY ATHEROSCLEROSIS NATIVE CORONARY ARTERY    a. 01/2011 Cath/PCI: LM nl, LAD 40-50p, D1 80-small, LCX 95-small, RI 90, RCA 100, EF 20%;  b. 01/2011 Card MRI - No transmural scar;  c. 01/2011 PCI RCA->5 Promus DES, RI->3.0x16 Promus DES; d. Cath 01/13/13 patent LAD & Ramus, diffuse LCx dz, RCA mult overlapping stents w/ 95% osital stenosis, EF 20%, s/p DES to ostial/prox RCA 01/24/13   . Diabetes mellitus, type 2 ([place])   . GERD (gastroesophageal reflux disease)   . Hematoma of leg    a. left leg hematoma 03/2012 in the setting of asa/effient  . Herpes zoster ophthalmicus   . HYPERLIPIDEMIA    intolerant to Lipitor (myalgias)  . HYPERTENSION   . Ischemic cardiomyopathy  a. 01/2012 Echo EF 45%, mild LVH; b. CL27%, grade 1 diastolic dysfunction, diffuse hypokinesis, inferoposterior akinesis   . Noncompliance   . Obesity   . OSA (obstructive sleep apnea)   . Polymyalgia rheumatica (HCC)     Tobacco Use: History  Smoking Status  . Former Smoker  . Packs/day: 0.30  . Years: 20.00  . Types: Cigarettes  . Quit date: 02/23/1989  Smokeless Tobacco  . Never Used     Labs: Recent Review Flowsheet Data    Labs for ITP Cardiac and Pulmonary Rehab Latest Ref Rng & Units 07/14/2016 10/06/2016 11/14/2016 11/14/2016 12/23/2016   Cholestrol 0 - 200 mg/dL - 146 - - 189   LDLCALC 0 - 99 mg/dL - 87 - - 104(H)   LDLDIRECT mg/dL - - - - -   HDL >40 mg/dL - 32(L) - - 33(L)   Trlycerides <150 mg/dL - 135 - - 259(H)   Hemoglobin A1c 4.6 - 6.5 % 8.7(H) - - - -   PHART 7.350 - 7.450 - - - - -   PCO2ART 35.0 - 45.0 mmHg - - - - -   HCO3 20.0 - 28.0 mmol/L - - 24.6 24.6 -   TCO2 0 - 100 mmol/L - - 26 26 -   ACIDBASEDEF 0.0 - 2.0 mmol/L - - 1.0 1.0 -   O2SAT % - - 57.0 57.0 -      Capillary Blood Glucose: Lab Results  Component Value Date   GLUCAP 312 (H) 02/11/2017   GLUCAP 479 (H) 02/11/2017   GLUCAP 329 (H) 02/02/2017   GLUCAP 230 (H) 01/17/2017   GLUCAP 166 (H) 01/17/2017     Exercise Target Goals:    Exercise Program Goal: Individual exercise prescription set with THRR, safety & activity barriers. Participant demonstrates ability to understand and report RPE using BORG scale, to self-measure pulse accurately, and to acknowledge the importance of the exercise prescription.  Exercise Prescription Goal: Starting with aerobic activity 30 plus minutes a day, 3 days per week for initial exercise prescription. Provide home exercise prescription and guidelines that participant acknowledges understanding prior to discharge.  Activity Barriers & Risk Stratification:     Activity Barriers & Cardiac Risk Stratification - 12/16/16 1616      Activity Barriers & Cardiac Risk Stratification   Cardiac Risk Stratification High      6 Minute Walk:     6 Minute Walk    Row Name 12/16/16 1616         6 Minute Walk   Phase Initial     Distance 1175 feet     Walk Time 6 minutes     # of Rest Breaks 0     MPH 2.2     METS 2.35     RPE 13     VO2 Peak 8.24     Symptoms Yes (comment)     Comments 5/10 L hip/knee pain     Resting HR 98 bpm     Resting  BP 108/64     Max Ex. HR 124 bpm     Max Ex. BP 134/62     2 Minute Post BP 106/60        Oxygen Initial Assessment:   Oxygen Re-Evaluation:   Oxygen Discharge (Final Oxygen Re-Evaluation):   Initial Exercise Prescription:     Initial Exercise Prescription - 12/16/16 1600      Date of Initial Exercise RX and Referring Provider   Date 12/16/16   Referring  Provider Loralie Champagne, MD     Recumbant Bike   Level 2   Minutes 10   METs 1.2     NuStep   Level 2   Minutes 10   METs 1.5     Track   Laps 8   Minutes 10   METs 2.39     Prescription Details   Frequency (times per week) 3   Duration Progress to 30 minutes of continuous aerobic without signs/symptoms of physical distress     Intensity   THRR 40-80% of Max Heartrate 59-118   Ratings of Perceived Exertion 11-13   Perceived Dyspnea 0-4     Progression   Progression Continue progressive overload as per policy without signs/symptoms or physical distress.     Resistance Training   Training Prescription Yes   Weight 2lbs   Reps 10-12      Perform Capillary Blood Glucose checks as needed.  Exercise Prescription Changes:     Exercise Prescription Changes    Row Name 01/13/17 1500 02/10/17 1200           Response to Exercise   Blood Pressure (Admit) 118/62 110/60      Blood Pressure (Exercise) 120/62 128/60      Blood Pressure (Exit) 104/60 110/60      Heart Rate (Admit) 97 bpm 77 bpm      Heart Rate (Exercise) 105 bpm 105 bpm      Heart Rate (Exit) 96 bpm 72 bpm      Rating of Perceived Exertion (Exercise) 12 13      Symptoms none none      Comments Reviewed HEP on 01/07/17 Reviewed HEP on 01/07/17      Duration Progress to 30 minutes of continuous aerobic without signs/symptoms of physical distress Progress to 30 minutes of  aerobic without signs/symptoms of physical distress      Intensity THRR unchanged THRR unchanged        Progression   Average METs 2.4 2.4        Resistance Training    Training Prescription Yes Yes      Weight 5lbs 5lbs      Reps 10-12 10-15      Time  - 10 Minutes        Recumbant Bike   Level 2 3      Minutes 10 10      METs 2.1 2.3        NuStep   Level 3 4      Minutes 10 10      METs 2.7 2.7        Arm Ergometer   Level 1 1      Watts 25 25      Minutes 10 10      METs 2.25 2.27        Home Exercise Plan   Plans to continue exercise at Home  Reviewed HEP on 01/07/17 see progress note Home (comment)  Reviewed HEP on 01/07/17 see progress note      Frequency Add 2 additional days to program exercise sessions. Add 2 additional days to program exercise sessions.      Initial Home Exercises Provided  - 01/07/17        Exercise Review   Progression Yes Yes         Exercise Comments:     Exercise Comments    Row Name 01/13/17 1534 02/09/17 1105         Exercise  Comments Reviewed METs and goals. Pt is tolerating exercise well; will continue to monitor exercise progression. Reviewed METs and goals. Pt is tolerating exercise well; will continue to monitor exercise progression.         Exercise Goals and Review:     Exercise Goals    Row Name 02/09/17 1106             Exercise Goals   Increase Physical Activity Yes       Intervention Provide advice, education, support and counseling about physical activity/exercise needs.;Develop an individualized exercise prescription for aerobic and resistive training based on initial evaluation findings, risk stratification, comorbidities and participant's personal goals.       Expected Outcomes Achievement of increased cardiorespiratory fitness and enhanced flexibility, muscular endurance and strength shown through measurements of functional capacity and personal statement of participant.       Increase Strength and Stamina Yes       Intervention Provide advice, education, support and counseling about physical activity/exercise needs.;Develop an individualized exercise prescription for  aerobic and resistive training based on initial evaluation findings, risk stratification, comorbidities and participant's personal goals.       Expected Outcomes Achievement of increased cardiorespiratory fitness and enhanced flexibility, muscular endurance and strength shown through measurements of functional capacity and personal statement of participant.          Exercise Goals Re-Evaluation :     Exercise Goals Re-Evaluation    [place] Name 02/10/17 1455 02/10/17 1456           Exercise Goal Re-Evaluation   Exercise Goals Review Increase Physical Activity;Increase Strenth and Stamina  -      Comments Pt is increasing in actvity levels and has improved MET levels Pt is increasing in actvity levels and has increased workload on Nustep machine.      Expected Outcomes  - Pt will continue to improve in cardiorespiratory fitness          Discharge Exercise Prescription (Final Exercise Prescription Changes):     Exercise Prescription Changes - 02/10/17 1200      Response to Exercise   Blood Pressure (Admit) 110/60   Blood Pressure (Exercise) 128/60   Blood Pressure (Exit) 110/60   Heart Rate (Admit) 77 bpm   Heart Rate (Exercise) 105 bpm   Heart Rate (Exit) 72 bpm   Rating of Perceived Exertion (Exercise) 13   Symptoms none   Comments Reviewed HEP on 01/07/17   Duration Progress to 30 minutes of  aerobic without signs/symptoms of physical distress   Intensity THRR unchanged     Progression   Average METs 2.4     Resistance Training   Training Prescription Yes   Weight 5lbs   Reps 10-15   Time 10 Minutes     Recumbant Bike   Level 3   Minutes 10   METs 2.3     NuStep   Level 4   Minutes 10   METs 2.7     Arm Ergometer   Level 1   Watts 25   Minutes 10   METs 2.27     Home Exercise Plan   Plans to continue exercise at Home (comment)  Reviewed HEP on 01/07/17 see progress note   Frequency Add 2 additional days to program exercise sessions.   Initial Home  Exercises Provided 01/07/17     Exercise Review   Progression Yes      Nutrition:  Target Goals: Understanding of nutrition guidelines, daily intake of  sodium '1500mg'$ , cholesterol '200mg'$ , calories 30% from fat and 7% or less from saturated fats, daily to have 5 or more servings of fruits and vegetables.  Biometrics:     Pre Biometrics - 12/16/16 1619      Pre Biometrics   Height 5' 7.5" (1.715 m)   Weight 225 lb 15.5 oz (102.5 kg)   Waist Circumference 46 inches   Hip Circumference 46 inches   Waist to Hip Ratio 1 %   BMI (Calculated) 34.9   Triceps Skinfold 12 mm   % Body Fat 32.4 %   Grip Strength 42 kg   Flexibility 18 in   Single Leg Stand 18 seconds       Nutrition Therapy Plan and Nutrition Goals:     Nutrition Therapy & Goals - 01/16/17 1215      Nutrition Therapy   Diet Carb Modified, Therapeutic Lifestyle Changes     Personal Nutrition Goals   Nutrition Goal Wt loss of 1-2 lb/week to a wt loss goal of 6-24 lb at graduation from [place] Goal #2 Improved glycemic control as evidenced by a decrease in A1c from 8.7 to a goal of < 7%.     Intervention Plan   Intervention Prescribe, educate and counsel regarding individualized specific dietary modifications aiming towards targeted core components such as weight, hypertension, lipid management, diabetes, heart failure and other comorbidities.   Expected Outcomes Short Term Goal: Understand basic principles of dietary content, such as calories, fat, sodium, cholesterol and nutrients.;Long Term Goal: Adherence to prescribed nutrition plan.      Nutrition Discharge: Nutrition Scores:     Nutrition Assessments - 01/16/17 1215      MEDFICTS Scores   Pre Score 44      Nutrition Goals Re-Evaluation:   Nutrition Goals Re-Evaluation:   Nutrition Goals Discharge (Final Nutrition Goals Re-Evaluation):   Psychosocial: Target Goals: Acknowledge presence or absence of significant depression  and/or stress, maximize coping skills, provide positive support system. Participant is able to verbalize types and ability to use techniques and skills needed for reducing stress and depression.  Initial Review & Psychosocial Screening:     Initial Psych Review & Screening - 12/18/16 0935      Family Dynamics   Good Support System? Yes   Comments A brief assesment reveals no further psychosocial needed at this time.     Barriers   Psychosocial barriers to participate in program There are no identifiable barriers or psychosocial needs.     Screening Interventions   Interventions Encouraged to exercise      Quality of Life Scores:     Quality of Life - 12/16/16 1357      Quality of Life Scores   Health/Function Pre 17.23 %   Socioeconomic Pre 21.5 %   Psych/Spiritual Pre 27 %   Family Pre 20.63 %   GLOBAL Pre 20.5 %      PHQ-9: Recent Review Flowsheet Data    Depression screen [facility] 2/9 12/24/2016 09/30/2016   Decreased Interest 0 0   Down, Depressed, Hopeless 0 0   PHQ - 2 Score 0 0     Interpretation of Total Score  Total Score Depression Severity:  1-4 = Minimal depression, 5-9 = Mild depression, 10-14 = Moderate depression, 15-19 = Moderately severe depression, 20-27 = Severe depression   Psychosocial Evaluation and Intervention:     Psychosocial Evaluation - 02/11/17 1856      Psychosocial Evaluation & Interventions   Interventions  Encouraged to exercise with the program and follow exercise prescription;Stress management education;Relaxation education   Continue Psychosocial Services  Follow up required by staff      Psychosocial Re-Evaluation:     Psychosocial Re-Evaluation    [place] Name 01/15/17 1542 02/11/17 1856           Psychosocial Re-Evaluation   Current issues with  - Current Anxiety/Panic      Comments Pt with good support system at home. Pt with good support system at home.      Interventions Encouraged to attend Cardiac Rehabilitation for  the exercise;Relaxation education;Stress management education Stress management education;Relaxation education;Encouraged to attend Cardiac Rehabilitation for the exercise         Psychosocial Discharge (Final Psychosocial Re-Evaluation):     Psychosocial Re-Evaluation - 02/11/17 1856      Psychosocial Re-Evaluation   Current issues with Current Anxiety/Panic   Comments Pt with good support system at home.   Interventions Stress management education;Relaxation education;Encouraged to attend Cardiac Rehabilitation for the exercise      Vocational Rehabilitation: Provide vocational rehab assistance to qualifying candidates.   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 Participant will state understanding/return demonstration of topics presented.  Learning Barriers/Preferences:     Learning Barriers/Preferences - 12/16/16 1455      Learning Barriers/Preferences   Learning Barriers Sight   Learning Preferences Verbal Instruction;Written Material;Skilled Demonstration;Individual Instruction;Group Instruction      Education Topics: Count Your Pulse:  -Group instruction provided by verbal instruction, demonstration, patient participation and written materials to support subject.  Instructors address importance of being able to find your pulse and how to count your pulse when at home without a heart monitor.  Patients get hands on experience counting their pulse with staff help and individually.   Heart Attack, Angina, and Risk Factor Modification:  -Group instruction provided by verbal instruction, video, and written materials to support subject.  Instructors address signs and  symptoms of angina and heart attacks.    Also discuss risk factors for heart disease and how to make changes to improve heart health risk factors. Flowsheet Row CARDIAC REHAB PHASE II EXERCISE from 02/06/2017 in [place]  Date  01/07/17  Instruction Review Code  2- meets goals/outcomes      Functional Fitness:  -Group instruction provided by verbal instruction, demonstration, patient participation, and written materials to support subject.  Instructors address safety measures for doing things around the house.  Discuss how to get up and down off the floor, how to pick things up properly, how to safely get out of a chair without assistance, and balance training.   Meditation and Mindfulness:  -Group instruction provided by verbal instruction, patient participation, and written materials to support subject.  Instructor addresses importance of mindfulness and meditation practice to help reduce stress and improve awareness.  Instructor also leads participants through a meditation exercise.  Flowsheet Row CARDIAC REHAB PHASE II EXERCISE from 02/06/2017 in [place]  Date  02/04/17  Educator  [place]  Instruction Review Code  2- meets goals/outcomes      Stretching for Flexibility and Mobility:  -Group instruction  provided by verbal instruction, patient participation, and written materials to support subject.  Instructors lead participants through series of stretches that are designed to increase flexibility thus improving mobility.  These stretches are additional exercise for major muscle groups that are typically performed during regular warm up and cool down. Flowsheet Row CARDIAC REHAB PHASE II EXERCISE from 02/06/2017 in [place]  Date  01/09/17  Instruction Review Code  2- meets goals/outcomes      Hands Only CPR Anytime:  -Group instruction provided by verbal instruction, video,  patient participation and written materials to support subject.  Instructors co-teach with [facility] video for hands only CPR.  Participants get hands on experience with mannequins.   Nutrition I class: Heart Healthy Eating:  -Group instruction provided by PowerPoint slides, verbal discussion, and written materials to support subject matter. The instructor gives an explanation and review of the Therapeutic Lifestyle Changes diet recommendations, which includes a discussion on lipid goals, dietary fat, sodium, fiber, plant stanol/sterol esters, sugar, and the components of a well-balanced, healthy diet.   Nutrition II class: Lifestyle Skills:  -Group instruction provided by PowerPoint slides, verbal discussion, and written materials to support subject matter. The instructor gives an explanation and review of label reading, grocery shopping for heart health, heart healthy recipe modifications, and ways to make healthier choices when eating out.   Diabetes Question & Answer:  -Group instruction provided by PowerPoint slides, verbal discussion, and written materials to support subject matter. The instructor gives an explanation and review of diabetes co-morbidities, pre- and post-prandial blood glucose goals, pre-exercise blood glucose goals, signs, symptoms, and treatment of hypoglycemia and hyperglycemia, and foot care basics. Flowsheet Row CARDIAC REHAB PHASE II EXERCISE from 02/06/2017 in [place]  Date  01/23/17  Educator  RD  Instruction Review Code  2- meets goals/outcomes      Diabetes Blitz:  -Group instruction provided by PowerPoint slides, verbal discussion, and written materials to support subject matter. The instructor gives an explanation and review of the physiology behind type 1 and type 2 diabetes, diabetes medications and rational behind using different medications, pre- and post-prandial blood glucose recommendations and Hemoglobin A1c goals, diabetes  diet, and exercise including blood glucose guidelines for exercising safely.    Portion Distortion:  -Group instruction provided by PowerPoint slides, verbal discussion, written materials, and food models to support subject matter. The instructor gives an explanation of serving size versus portion size, changes in portions sizes over the last 20 years, and what consists of a serving from each food group.   Stress Management:  -Group instruction provided by verbal instruction, video, and written materials to support subject matter.  Instructors review role of stress in heart disease and how to cope with stress positively.   Flowsheet Row CARDIAC REHAB PHASE II EXERCISE from 02/06/2017 in [place]  Date  01/02/17  Instruction Review Code  2- meets goals/outcomes      Exercising on Your Own:  -Group instruction provided by verbal instruction, power point, and written materials to support subject.  Instructors discuss benefits of exercise, components of exercise, frequency and intensity of exercise, and end points for exercise.  Also discuss use of nitroglycerin and activating EMS.  Review options of places to exercise outside of rehab.  Review guidelines for sex with heart disease. Flowsheet Row CARDIAC REHAB PHASE II EXERCISE from 02/06/2017 in [place]  Date  01/14/17  Educator  [employer] EP  Instruction Review Code  2- meets goals/outcomes      Cardiac Drugs I:  -Group instruction provided by verbal instruction and written materials to support subject.  Instructor reviews cardiac drug classes: antiplatelets, anticoagulants, beta blockers, and statins.  Instructor discusses reasons, side effects, and lifestyle considerations for each drug class. Flowsheet Row CARDIAC REHAB PHASE II EXERCISE from 02/06/2017 in [place]  Date  01/28/17  Instruction Review Code  2- meets goals/outcomes       Cardiac Drugs II:  -Group instruction provided by verbal instruction and written materials to support subject.  Instructor reviews cardiac drug classes: angiotensin converting enzyme inhibitors (ACE-I), angiotensin II receptor blockers (ARBs), nitrates, and calcium channel blockers.  Instructor discusses reasons, side effects, and lifestyle considerations for each drug class. Flowsheet Row CARDIAC REHAB PHASE II EXERCISE from 02/06/2017 in [place]  Date  12/24/16  Educator  Pharmacist  Instruction Review Code  2- meets goals/outcomes      Anatomy and Physiology of the Circulatory System:  -Group instruction provided by verbal instruction, video, and written materials to support subject.  Reviews functional anatomy of heart, how it relates to various diagnoses, and what role the heart plays in the overall system. Flowsheet Row CARDIAC REHAB PHASE II EXERCISE from 02/06/2017 in [place]  Date  01/21/17  Instruction Review Code  2- meets goals/outcomes      Knowledge Questionnaire Score:     Knowledge Questionnaire Score - 12/16/16 1357      Knowledge Questionnaire Score   Pre Score 19/24      Core Components/Risk Factors/Patient Goals at Admission:     Personal Goals and Risk Factors at Admission - 12/16/16 1458      Core Components/Risk Factors/Patient Goals on Admission    Weight Management Yes;Weight Loss;Obesity;Weight Maintenance   Intervention Weight Management: Develop a combined nutrition and exercise program designed to reach desired caloric intake, while maintaining appropriate intake of nutrient and fiber, sodium and fats, and appropriate energy expenditure required for the weight goal.;Weight Management: Provide education and appropriate resources to help participant work on and attain dietary goals.;Weight Management/Obesity: Establish reasonable short term and long term weight goals.;Obesity:  Provide education and appropriate resources to help participant work on and attain dietary goals.   Expected Outcomes Short Term: Continue to assess and modify interventions until short term weight is achieved;Long Term: Adherence to nutrition and physical activity/exercise program aimed toward attainment of established weight goal;Weight Maintenance: Understanding of the daily nutrition guidelines, which includes 25-35% calories from fat, 7% or less cal from saturated fats, less than 256m cholesterol, less than 1.5gm of sodium, & 5 or more servings of fruits and vegetables daily;Weight Loss: Understanding of general recommendations for a balanced deficit meal plan, which promotes 1-2 lb weight loss per week and includes a negative energy balance of [phone] kcal/d;Understanding recommendations for meals to include 15-35% energy as protein, 25-35% energy from fat, 35-60% energy from carbohydrates, less than 2031mof dietary cholesterol, 20-35 gm of total fiber daily;Understanding of distribution of calorie intake throughout the day with the consumption of 4-5 meals/snacks   Sedentary Yes   Intervention Provide advice, education, support and counseling about physical activity/exercise needs.;Develop an individualized exercise prescription for aerobic and resistive training based on initial evaluation findings, risk stratification, comorbidities and participant's personal goals.   Expected Outcomes Achievement of increased cardiorespiratory fitness and enhanced flexibility, muscular endurance and strength shown  through measurements of functional capacity and personal statement of participant.   Increase Strength and Stamina Yes   Intervention Provide advice, education, support and counseling about physical activity/exercise needs.;Develop an individualized exercise prescription for aerobic and resistive training based on initial evaluation findings, risk stratification, comorbidities and participant's personal  goals.   Expected Outcomes Achievement of increased cardiorespiratory fitness and enhanced flexibility, muscular endurance and strength shown through measurements of functional capacity and personal statement of participant.   Improve shortness of breath with ADL's Yes   Intervention Provide education, individualized exercise plan and daily activity instruction to help decrease symptoms of SOB with activities of daily living.   Expected Outcomes Short Term: Achieves a reduction of symptoms when performing activities of daily living.   Diabetes Yes   Intervention Provide education about signs/symptoms and action to take for hypo/hyperglycemia.;Provide education about proper nutrition, including hydration, and aerobic/resistive exercise prescription along with prescribed medications to achieve blood glucose in normal ranges: Fasting glucose 65-99 mg/dL   Expected Outcomes Short Term: Participant verbalizes understanding of the signs/symptoms and immediate care of hyper/hypoglycemia, proper foot care and importance of medication, aerobic/resistive exercise and nutrition plan for blood glucose control.;Long Term: Attainment of HbA1C < 7%.   Hypertension Yes   Intervention Provide education on lifestyle modifcations including regular physical activity/exercise, weight management, moderate sodium restriction and increased consumption of fresh fruit, vegetables, and low fat dairy, alcohol moderation, and smoking cessation.;Monitor prescription use compliance.   Expected Outcomes Short Term: Continued assessment and intervention until BP is < 140/79m HG in hypertensive participants. < 130/887mHG in hypertensive participants with diabetes, heart failure or chronic kidney disease.;Long Term: Maintenance of blood pressure at goal levels.   Lipids Yes   Intervention Provide education and support for participant on nutrition & aerobic/resistive exercise along with prescribed medications to achieve LDL <7035mHDL  >28m44m Expected Outcomes Short Term: Participant states understanding of desired cholesterol values and is compliant with medications prescribed. Participant is following exercise prescription and nutrition guidelines.;Long Term: Cholesterol controlled with medications as prescribed, with individualized exercise RX and with personalized nutrition plan. Value goals: LDL < 70mg41mL > 40 mg.   Personal Goal Other Yes   Personal Goal Improve walking tolerance and overall CV health.   Intervention Provide exercise programming to assist with improving exercise capacity and provide cardiac education classes to increase awareness and knowledge   Expected Outcomes Pt will be able to walk with little to no symptoms of leg pain. Pt will improve overall quality of life      Core Components/Risk Factors/Patient Goals Review:      Goals and Risk Factor Review    Row Name 01/13/17 1535             Core Components/Risk Factors/Patient Goals Review   Personal Goals Review Other       Review Pt is walking 2x/week at home for exercise without difficulty       Expected Outcomes Pt will improve in exercise tolerance and overall fitness levels.          Core Components/Risk Factors/Patient Goals at Discharge (Final Review):      Goals and Risk Factor Review - 01/13/17 1535      Core Components/Risk Factors/Patient Goals Review   Personal Goals Review Other   Review Pt is walking 2x/week at home for exercise without difficulty   Expected Outcomes Pt will improve in exercise tolerance and overall fitness levels.      ITP Comments:  ITP Comments    Row Name 12/16/16 1339 01/16/17 1105         ITP Comments Dr Sherle Poe, Medical Director Patient attended Hypertension Class on 01/16/17         Comments:  Pt is making expected progress toward personal goals after completing  20 sessions. Pt is presenlty in the hospital due to appropriate firing of his lifevest. Pt is scheduled to have ICD  placed on tomorrow. Psychosocial Assessment Pt has supportve wife and feels he feels good about his future.  Pt is often engaging with fellow participants and providing support and encouragement to others. Recommend continued exercise and life style modification education including  stress management and relaxation techniques to decrease cardiac risk profile. Cherre Huger, BSN

## 2017-02-11 NOTE — H&P (Addendum)
Advanced Heart Failure Team History and Physical Note   Primary Physician:   Primary Cardiologist:  Dr. Aundra Dubin   Reason for Admission: Syncope with Lifevest Shock -> Suspected Cardiac/VT Arrest.   HPI:    Wayne [name] is a 76 y.o. male with h/o DM2, HTN, CAD s/p DES to RCA 2012 with DES to proximal LAD [id number], Systolic CHF EF 0000000 due to ICM.   Lifevest placed 11/2016 with low EF and ICD consideration. Planned to repeat Echo 02/2017.   Admitted 01/2017 with AFib RVR. Started on amio for rate control and spontaneously converted.    Seen in HF clinic 01/23/17. Was thought to be stable. Felt better back in NSR.   Pt presents to [facility] after losing consciousness at cardiac rehab and receiving shock from his [place]. He was waiting in line for the bathroom and was not engaged in exercise at the time.  Was in his USOH leading up to the event.  Denies CP or palpitations.  Did feel dizzy immediately before "going out". Labs pending.   Feeling anxious and depressed currently  Sore from [place]. Denies SOB. No recent illness. Felt OK until right before syncopal episode. Back of head sore and + hematoma. No weakness, slurred speech, or confusion. Has been taking all medications as directed  Review of Systems: [y] = yes, [ ]  = no   General: Weight gain [ ] ; Weight loss [ ] ; Anorexia [ ] ; Fatigue [ ] ; Fever [ ] ; Chills [ ] ; Weakness [ ]   Cardiac: Chest pain/pressure [ ] ; Resting SOB [ ] ; Exertional SOB [y]; Orthopnea [ ] ; Pedal Edema [ ] ; Palpitations [ ] ; Syncope [y]; Presyncope [ ] ; Paroxysmal nocturnal dyspnea[ ]   Pulmonary: Cough [ ] ; Wheezing[ ] ; Hemoptysis[ ] ; Sputum [ ] ; Snoring [ ]   GI: Vomiting[ ] ; Dysphagia[ ] ; Melena[ ] ; Hematochezia [ ] ; Heartburn[ ] ; Abdominal pain [ ] ; Constipation [ ] ; Diarrhea [ ] ; BRBPR [ ]   GU: Hematuria[ ] ; Dysuria [ ] ; Nocturia[ ]   Vascular: Pain in legs with walking [ ] ; Pain in feet with lying flat [ ] ; Non-healing sores [ ] ; Stroke [ ] ; TIA [ ] ; Slurred  speech [ ] ;  Neuro: Headaches[ ] ; Vertigo[ ] ; Seizures[ ] ; Paresthesias[ ] ;Blurred vision [ ] ; Diplopia [ ] ; Vision changes [ ]   Ortho/Skin: Arthritis [ ] ; Joint pain [ ] ; Muscle pain [ ] ; Joint swelling [ ] ; Back Pain [ ] ; Rash [ ]   Psych: Depression[ ] ; Anxiety[ ]   Heme: Bleeding problems [ ] ; Clotting disorders [ ] ; Anemia [ ]   Endocrine: Diabetes [ ] ; Thyroid dysfunction[ ]    Home Medications Prior to Admission medications   Medication Sig Start Date End Date Taking? Authorizing Provider  acetaminophen (TYLENOL) 500 MG tablet Take 500 mg by mouth daily as needed for moderate pain or headache.    Historical Provider, MD  amiodarone (PACERONE) 200 MG tablet 2 tabs bid x 1 week, then 1 tab BID x 1 week, then 1 tab daily 01/17/17   Rogelia Mire, NP  carvedilol (COREG) 6.25 MG tablet Take 1 tablet (6.25 mg total) by mouth 2 (two) times daily. 01/23/17 04/23/17  Larey Dresser, MD  digoxin (LANOXIN) 0.125 MG tablet Take 1 tablet (0.125 mg total) by mouth daily. 11/21/16   Shirley Friar, PA-C  diphenhydramine-acetaminophen (TYLENOL PM) 25-500 MG TABS tablet Take 1 tablet by mouth at bedtime as needed (sleep).    Historical Provider, MD  Evolocumab (REPATHA SURECLICK) [id number] MG/ML SOAJ Inject 1  pen into the skin every 14 (fourteen) days.    Historical Provider, MD  furosemide (LASIX) 20 MG tablet Take by mouth. 80 mg in the AM and 60 mg in the PM    Historical Provider, MD  glimepiride (AMARYL) 2 MG tablet Take 1 tablet (2 mg total) by mouth daily before breakfast. 10/31/16   [name], MD  glucose blood (ACCU-CHEK AVIVA) test strip Check blood sugars 1-2 times per day. DX: E11.65 09/30/16   [name], MD  Lancets (ACCU-CHEK SOFT TOUCH) lancets Check blood sugars 1-2 times per day. DX: E11.65 09/30/16   [name], MD  losartan (COZAAR) 25 MG tablet Take 1 tablet (25 mg total) by mouth 2 (two) times daily. 12/23/16   Larey Dresser, MD  Multiple Vitamins-Minerals  (CENTRUM SILVER ADULT 50+) TABS Take 1 tablet by mouth daily.    Historical Provider, MD  pravastatin (PRAVACHOL) 20 MG tablet Take 1 tablet (20 mg total) by mouth every evening. 01/23/17 04/23/17  Larey Dresser, MD  Rivaroxaban (XARELTO) 15 MG TABS tablet Take 1 tablet (15 mg total) by mouth daily. 01/18/17   Rogelia Mire, NP  spironolactone (ALDACTONE) 25 MG tablet Take 1 tablet (25 mg total) by mouth daily. 12/03/16   Amy D Ninfa Meeker, NP  ticagrelor (BRILINTA) 60 MG TABS tablet Take 1 tablet (60 mg total) by mouth 2 (two) times daily. 01/17/17   Rogelia Mire, NP    Past Medical History: Past Medical History:  Diagnosis Date  . Adenocarcinoma of prostate ([place])    s/p seed implants  . Arthritis   . Cellulitis of left leg    a. 0000000 complicated by septic shock  . Chronic combined systolic and diastolic CHF (congestive heart failure) ([facility])   . Colon polyps   . CORONARY ATHEROSCLEROSIS NATIVE CORONARY ARTERY    a. 01/2011 Cath/PCI: LM nl, LAD 40-50p, D1 80-small, LCX 95-small, RI 90, RCA 100, EF 20%;  b. 01/2011 Card MRI - No transmural scar;  c. 01/2011 PCI RCA->5 Promus DES, RI->3.0x16 Promus DES; d. Cath 01/13/13 patent LAD & Ramus, diffuse LCx dz, RCA mult overlapping stents w/ 95% osital stenosis, EF 20%, s/p DES to ostial/prox RCA 01/24/13   . Diabetes mellitus, type 2 ([place])   . GERD (gastroesophageal reflux disease)   . Hematoma of leg    a. left leg hematoma 03/2012 in the setting of asa/effient  . Herpes zoster ophthalmicus   . HYPERLIPIDEMIA    intolerant to Lipitor (myalgias)  . HYPERTENSION   . Ischemic cardiomyopathy    a. 01/2012 Echo EF 45%, mild LVH; b. [id number], grade 1 diastolic dysfunction, diffuse hypokinesis, inferoposterior akinesis   . Noncompliance   . Obesity   . OSA (obstructive sleep apnea)   . Polymyalgia rheumatica ([facility])     Past Surgical History: Past Surgical History:  Procedure Laterality Date  . CARDIAC CATHETERIZATION  01/24/2013  . CARDIAC  CATHETERIZATION N/A 11/14/2016   Procedure: Right/Left Heart Cath and Coronary Angiography;  Surgeon: Larey Dresser, MD;  Location: [place] [place] CV [facility];  Service: Cardiovascular;  Laterality: N/A;  . CARDIAC CATHETERIZATION N/A 11/17/2016   Procedure: Coronary Stent Intervention w/Impella;  Surgeon: Peter M Martinique, MD;  Location: [place] [facility];  Service: Cardiovascular;  Laterality: N/A;  . CARDIAC CATHETERIZATION N/A 11/17/2016   Procedure: Coronary Atherectomy;  Surgeon: Peter M Martinique, MD;  Location: [place] CV LAB;  Service: Cardiovascular;  Laterality: N/A;  . CORONARY ANGIOPLASTY WITH  STENT PLACEMENT  01/24/2013   DES to RCA  . CORONARY STENT PLACEMENT  2012   reports 6 stents placed  . I&D EXTREMITY  06/15/2012   Procedure: IRRIGATION AND DEBRIDEMENT EXTREMITY;  Surgeon: Newt Minion, MD;  Location: [facility];  Service: Orthopedics;  Laterality: Left;  I&D Left Posterior Knee  . I&D EXTREMITY  06/30/2012   Procedure: IRRIGATION AND DEBRIDEMENT EXTREMITY;  Surgeon: Newt Minion, MD;  Location: [facility];  Service: Orthopedics;  Laterality: Left;  Left Leg Irrigation and Debridement and placement of Wound VAC and application of  A-cell  . I&D EXTREMITY  07/20/2012   Procedure: IRRIGATION AND DEBRIDEMENT EXTREMITY;  Surgeon: Newt Minion, MD;  Location: [place];  Service: Orthopedics;  Laterality: Left;  Irrigation and Debridement Left Leg and Place antibiotic beads   . PERCUTANEOUS CORONARY STENT INTERVENTION (PCI-S) N/A 01/24/2013   Procedure: PERCUTANEOUS CORONARY STENT INTERVENTION (PCI-S);  Surgeon: Wellington Hampshire, MD;  Location: [facility] CATH LAB;  Service: Cardiovascular;  Laterality: N/A;  . PROSTATE SURGERY     cancer, seed implant  . ULTRASOUND GUIDANCE FOR VASCULAR ACCESS  11/14/2016   Procedure: Ultrasound Guidance For Vascular Access;  Surgeon: Larey Dresser, MD;  Location: [place] CV LAB;  Service: Cardiovascular;;    Family History:  Family History  Problem Relation Age of  Onset  . Cancer Mother 97    unknown CA  . Heart disease Father   . Alcohol abuse Father   . Heart attack Father 32    Social History: Social History   Social History  . Marital status: Married    Spouse name: N/A  . Number of children: 1  . Years of education: N/A   Occupational History  . 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 Smoking status: Former Smoker    Packs/day: 0.30    Years: 20.00    Types: Cigarettes    Quit date: 02/23/1989  . Smokeless tobacco: Never Used  . Alcohol use No  . Drug use: No  . Sexual activity: Not Asked   Other Topics Concern  . None   Social History Narrative   Retired Administrator    Allergies:  Allergies  Allergen Reactions  . Plavix [Clopidogrel]     Nose bleeds, swellings, whelps on legs & back, itching  . Lipitor [Atorvastatin] Other (See Comments)    REACTION: sore legs  . Crestor [Rosuvastatin Calcium] Other (See Comments)    Myalgias, Interfering with Gait  . Entresto [Sacubitril-Valsartan]     Angioedema  . Ancef [Cefazolin] Rash    Describes itching and rash, but said 'it wasn't that bad'    Objective:    Vital Signs:   Temp:  [97.8 F (36.6 C)] 97.8 F (36.6 C) (02/28 1059) Pulse Rate:  [73] 73 (02/28 1059) Resp:  [14] 14 (02/28 1059) BP: (139)/(70) 139/70 (02/28 1059) SpO2:  [99 %] 99 % (02/28 1059)    Wt Readings from Last 3 Encounters:  01/23/17 232 lb 1.9 oz (105.3 kg)  01/17/17 225 lb (102.1 kg)  12/23/16 228 lb 12 oz (103.8 kg)     Physical Exam: General:  Anxious. NAD.  HEENT: Hematoma to R side of head, posteriorly. Mild abrasion over same.  Neck: supple. JVD 7-8 cm. Carotids 2+ bilat; no bruits. No lymphadenopathy or thyromegaly appreciated. Cor: PMI nondisplaced. Regular rate & rhythm. No rubs, gallops or murmurs. Blue gel from [employer] discharge.  Lungs:  Clear. Normal effort Abdomen: soft, NT, ND, no HSM. No bruits or masses. +BS  Extremities: no cyanosis,  clubbing, rash. Trace ankle edema at most.  Neuro: alert & oriented x 3, cranial nerves grossly intact. moves all 4 extremities w/o difficulty. Affect flat but appropriate.  Telemetry: Personally reviewed, NSR.  Labs: Basic Metabolic Panel: No results for input(s): NA, K, CL, CO2, GLUCOSE, BUN, CREATININE, CALCIUM, MG, PHOS in the last 168 hours.  Liver Function Tests: No results for input(s): AST, ALT, ALKPHOS, BILITOT, PROT, ALBUMIN in the last 168 hours. No results for input(s): LIPASE, AMYLASE in the last 168 hours. No results for input(s): AMMONIA in the last 168 hours.  CBC: No results for input(s): WBC, NEUTROABS, HGB, HCT, MCV, PLT in the last 168 hours.  Cardiac Enzymes: No results for input(s): CKTOTAL, CKMB, CKMBINDEX, TROPONINI in the last 168 hours.  BNP: BNP (last 3 results)  Recent Labs  11/14/16 1405 11/17/16 0210 01/16/17 1318  BNP 1,992.9* 2,032.0* 363.7*    ProBNP (last 3 results) No results for input(s): PROBNP in the last 8760 hours.   CBG:  Recent Labs Lab 02/11/17 1100  GLUCAP 479*    Coagulation Studies: No results for input(s): LABPROT, INR in the last 72 hours.  Other results: EKG: NSR  Imaging:  No results found.   Assessment/Plan   Wayne [name] is a 76 y.o. male with h/o DM2, HTN, CAD s/p DES to RCA 2012 with DES to proximal LAD [id number], Systolic CHF EF 0000000 due to ICM. Presented to [facility] after syncope and Lifevest shock.   1. Syncope/Lifevest shock - Very likely VT arrest. Lifevest interrogation pending.  Suspect this would be scar-mediated VT, less likely active ischemia/ACS given lack of chest pain or other symptomatology prior to event.  Will cycle troponin, however.  - EP to see. Will need ICD this admission if appropriate therapy, which it is expected to be.  - Electrolytes pending. Will supp aggressively as needed.  - He has been on amiodarone for atrial fibrillation, will increase to bid for a week.  2. Chronic systolic  CHF: Ischemic cardiomyopathy.  10/17 echo with EF 15%, diffuse hypokinesis.  CPX in 10/17 with severe HF limitation.  RHC in 12/17 with elevated filling pressures and low cardiac output.  He is now s/p PCI to proximal to mid LAD in 12/17 with much improved symptoms.   - NYHA Class II. Continue home dose of Lasix 80 qam/60 qpm.  - Continue spironolactone 25 daily.   - Continue losartan 25 mg bid. Of note, patient had possible angioedema related to [facility].   - Continue Coreg 6.25 mg bid.   - Continue digoxin 0.125 mg daily. Recent check not a trough level, so will add to am labs.  - Had planned on repeat Echo for ICD consideration. With syncope and likely VT as above, will consider ICD this admission. EP to see.  - Probably not a good CRT candidate, has IVCD with QRS 130 msec.  - He may be an LVAD candidate in the future.  As above, compliance has improved.  - Continue cardiac rehab.  3. CAD:  Now s/p PCI to proximal LAD.  He had an occluded RCA but there were left to right collaterals.   - Stable symptomatically. - Continue pravastatin 20 mg daily (not causing hip pain) => Needs to start [place]. - Anticoagulation for atrial fibrillation with antiplatelet agents is complicated with Mr Ridgel. He is allergic to Plavix, hadspontaneous gastrocnemius hemorrhage with prasugrel.  Current plan has been ticagrelor 60 bid for at least 6 months + Xarelto 15 (no aspirin).  4. HLD:  - Tolerating pravastatin 20 mg daily.  - Myalgias with Crestor and atorvastatin. Plan to start Datil soon.   5. Diabetes type II:  - SSI while in house.  6. Atrial fibrillation:   - Pt remains in NSR today.  - As above, on ticagrelor 60 mg bid and Xarelto 15 daily.   - Follow CBCs - Continue amiodarone, with suspected VT, increase to bid for 1 week.  - Could eventually consider atrial fibrillation ablation based on [place]-HF data.   Admit for evaluation and likely ICD placement this admission.   Length of Stay:  0  Annamaria Helling 02/11/2017, 11:27 AM  Advanced Heart Failure Team Pager [phone] (M-F; 7a - 4p)  Please contact Mobile Cardiology for night-coverage after hours (4p -7a ) and weekends on [email or web address]  Patient seen with PA, agree with the above note.    Mr Hillen had a syncopal event at cardiac rehab and was shocked by his Lifevest.  I suspect this was an appropriate shock, though awaiting interrogation.  He was feeling great prior to this event, no chest pain or significant dyspnea.  Suspect this is scar-mediated VT rather than ischemia/ACS.  ECG shows no changes.  Had recent LAD PCI, doubt stent thrombosis.   - Will cycle troponin, would hold off on cardiac cath unless there is a significant troponin rise.   - EP to see, would favor ICD implantation this admission.  Doubt QRS is wide enough to warrant CRT.  - Increase amiodarone to 200 mg bid x 1 week then back to 200 daily (had been on amiodarone for maintenance of NSR given atrial fibrillation).   - Discussed anticoagulation with Chanetta Marshall => he is on ticagrelor 60 mg bid and Xarelto 15 daily.  Unusual regimen due to PAF and LAD PCI in 12/17.  He is allergic to Plavix, we can no longer get ticlopidine which he took in the past, and he had a large gastrocnemius bleed with Effient.  Therefore, decided to follow [facility] data in a modified way with the above doses of Xarelto and ticagrelor. Will hold Xarelto pre-ICD and continue ticagrelor 60 mg bid (lower dose than usual 90 bid).  Resume Xarelto post-procedure when ok with EP.   Mr Delrosso appears well-compensated from CHF standpoint.  Would continue his current home medications except holding Xarelto as above.   He will need CT head given anticoagulation as he hit his head when he fell.   Loralie Champagne 02/11/2017 12:03 PM   Hold Lasix and spironolactone with elevated K and creatinine, will reassess based on tomorrow's labs.   Loralie Champagne 02/11/2017

## 2017-02-12 ENCOUNTER — Encounter (HOSPITAL_COMMUNITY): Payer: Self-pay | Admitting: Internal Medicine

## 2017-02-12 ENCOUNTER — Encounter (HOSPITAL_COMMUNITY)
Admission: EM | Disposition: A | Discharge status: Home / Self Care | Payer: Self-pay | Source: Home / Self Care | Attending: Cardiology

## 2017-02-12 DIAGNOSIS — I472 Ventricular tachycardia: Secondary | ICD-10-CM

## 2017-02-12 DIAGNOSIS — N179 Acute kidney failure, unspecified: Secondary | ICD-10-CM

## 2017-02-12 DIAGNOSIS — I481 Persistent atrial fibrillation: Secondary | ICD-10-CM

## 2017-02-12 HISTORY — PX: ICD IMPLANT: EP1208

## 2017-02-12 LAB — BASIC METABOLIC PANEL
Anion gap: 10 (ref 5–15)
BUN: 49 mg/dL — AB (ref 6–20)
CALCIUM: 9.4 mg/dL (ref 8.9–10.3)
CO2: 25 mmol/L (ref 22–32)
CREATININE: 1.86 mg/dL — AB (ref 0.61–1.24)
Chloride: 99 mmol/L — ABNORMAL LOW (ref 101–111)
GFR calc non Af Amer: 34 mL/min — ABNORMAL LOW (ref >60)
GFR, EST AFRICAN AMERICAN: 40 mL/min — AB (ref >60)
Glucose, Bld: 262 mg/dL — ABNORMAL HIGH (ref 65–99)
Potassium: 5.2 mmol/L — ABNORMAL HIGH (ref 3.5–5.1)
SODIUM: 134 mmol/L — AB (ref 135–145)

## 2017-02-12 LAB — CBC WITH DIFFERENTIAL/PLATELET
BASOS PCT: 0 %
Basophils Absolute: 0 10*3/uL (ref 0.0–0.1)
EOS PCT: 6 %
Eosinophils Absolute: 0.6 10*3/uL (ref 0.0–0.7)
HCT: 39.8 % (ref 39.0–52.0)
HEMOGLOBIN: 12.7 g/dL — AB (ref 13.0–17.0)
LYMPHS ABS: 1.6 10*3/uL (ref 0.7–4.0)
Lymphocytes Relative: 16 %
MCH: 27.8 pg (ref 26.0–34.0)
MCHC: 31.9 g/dL (ref 30.0–36.0)
MCV: 87.1 fL (ref 78.0–100.0)
MONOS PCT: 10 %
Monocytes Absolute: 1 10*3/uL (ref 0.1–1.0)
NEUTROS PCT: 68 %
Neutro Abs: 6.8 10*3/uL (ref 1.7–7.7)
PLATELETS: 191 10*3/uL (ref 150–400)
RBC: 4.57 MIL/uL (ref 4.22–5.81)
RDW: 14.6 % (ref 11.5–15.5)
WBC: 10 10*3/uL (ref 4.0–10.5)

## 2017-02-12 LAB — DIGOXIN LEVEL: DIGOXIN LVL: 1.3 ng/mL (ref 0.8–2.0)

## 2017-02-12 LAB — SURGICAL PCR SCREEN
MRSA, PCR: NEGATIVE
Staphylococcus aureus: POSITIVE — AB

## 2017-02-12 LAB — MAGNESIUM: Magnesium: 2.5 mg/dL — ABNORMAL HIGH (ref 1.7–2.4)

## 2017-02-12 LAB — TROPONIN I
Troponin I: 0.1 ng/mL (ref <0.03)
Troponin I: 0.11 ng/mL (ref <0.03)

## 2017-02-12 LAB — GLUCOSE, CAPILLARY
GLUCOSE-CAPILLARY: 233 mg/dL — AB (ref 65–99)
Glucose-Capillary: 297 mg/dL — ABNORMAL HIGH (ref 65–99)
Glucose-Capillary: 279 mg/dL — ABNORMAL HIGH (ref 65–99)

## 2017-02-12 SURGERY — ICD IMPLANT
Anesthesia: LOCAL

## 2017-02-12 MED ORDER — CHLORHEXIDINE GLUCONATE 4 % EX LIQD
60.0000 mL | Freq: Once | CUTANEOUS | Status: DC
  Administered 2017-02-12: 4 via TOPICAL
  Filled 2017-02-12: qty 60

## 2017-02-12 MED ORDER — CHLORHEXIDINE GLUCONATE 4 % EX LIQD
CUTANEOUS | Status: AC
  Filled 2017-02-12: qty 15

## 2017-02-12 MED ORDER — CHLORHEXIDINE GLUCONATE CLOTH 2 % EX PADS: 6.0000 | MEDICATED_PAD | Freq: Every day | CUTANEOUS | Status: DC

## 2017-02-12 MED ORDER — VANCOMYCIN HCL IN DEXTROSE 1-5 GM/200ML-% IV SOLN
1000.0000 mg | Freq: Two times a day (BID) | INTRAVENOUS | Status: AC
  Administered 2017-02-12: 1000 mg via INTRAVENOUS
  Filled 2017-02-12: qty 200

## 2017-02-12 MED ORDER — DIGOXIN 125 MCG PO TABS
0.0625 mg | ORAL_TABLET | Freq: Every day | ORAL | Status: DC
  Administered 2017-02-12 – 2017-02-13 (×2): 0.0625 mg via ORAL
  Filled 2017-02-12 (×2): qty 1

## 2017-02-12 MED ORDER — LIDOCAINE HCL (PF) 1 % IJ SOLN
INTRAMUSCULAR | Status: AC
  Filled 2017-02-12: qty 60

## 2017-02-12 MED ORDER — SODIUM CHLORIDE 0.9 % IR SOLN
Status: AC
  Filled 2017-02-12: qty 2

## 2017-02-12 MED ORDER — SODIUM CHLORIDE 0.9 % IV SOLN: INTRAVENOUS | Status: DC

## 2017-02-12 MED ORDER — VANCOMYCIN HCL IN DEXTROSE 1-5 GM/200ML-% IV SOLN
1000.0000 mg | INTRAVENOUS | Status: DC
  Filled 2017-02-12: qty 200

## 2017-02-12 MED ORDER — HEPARIN (PORCINE) IN NACL 2-0.9 UNIT/ML-% IJ SOLN
INTRAMUSCULAR | Status: AC
  Filled 2017-02-12: qty 500

## 2017-02-12 MED ORDER — VANCOMYCIN HCL IN DEXTROSE 1-5 GM/200ML-% IV SOLN
INTRAVENOUS | Status: AC
  Filled 2017-02-12: qty 200

## 2017-02-12 MED ORDER — MIDAZOLAM HCL 5 MG/5ML IJ SOLN
INTRAMUSCULAR | Status: AC
  Filled 2017-02-12: qty 5

## 2017-02-12 MED ORDER — CHLORHEXIDINE GLUCONATE CLOTH 2 % EX PADS
6.0000 | MEDICATED_PAD | Freq: Every day | CUTANEOUS | Status: DC
  Administered 2017-02-12: 6 via TOPICAL

## 2017-02-12 MED ORDER — FENTANYL CITRATE (PF) 100 MCG/2ML IJ SOLN
INTRAMUSCULAR | Status: DC | PRN
  Administered 2017-02-12: 12.5 ug via INTRAVENOUS
  Administered 2017-02-12: 25 ug via INTRAVENOUS
  Administered 2017-02-12: 12.5 ug via INTRAVENOUS

## 2017-02-12 MED ORDER — ACETAMINOPHEN 325 MG PO TABS: 325.0000 mg | ORAL_TABLET | ORAL | Status: DC | PRN

## 2017-02-12 MED ORDER — TRAMADOL HCL 50 MG PO TABS
50.0000 mg | ORAL_TABLET | Freq: Four times a day (QID) | ORAL | Status: DC | PRN
  Administered 2017-02-12: 50 mg via ORAL
  Filled 2017-02-12: qty 1

## 2017-02-12 MED ORDER — FENTANYL CITRATE (PF) 100 MCG/2ML IJ SOLN
INTRAMUSCULAR | Status: AC
  Filled 2017-02-12: qty 2

## 2017-02-12 MED ORDER — SODIUM CHLORIDE 0.9 % IR SOLN
80.0000 mg | Status: DC
  Filled 2017-02-12: qty 2

## 2017-02-12 MED ORDER — CHLORHEXIDINE GLUCONATE 4 % EX LIQD
60.0000 mL | Freq: Once | CUTANEOUS | Status: DC
  Filled 2017-02-12: qty 60

## 2017-02-12 MED ORDER — MIDAZOLAM HCL 5 MG/5ML IJ SOLN
INTRAMUSCULAR | Status: DC | PRN
  Administered 2017-02-12 (×4): 1 mg via INTRAVENOUS
  Administered 2017-02-12: 2 mg via INTRAVENOUS

## 2017-02-12 MED ORDER — MUPIROCIN 2 % EX OINT: 1.0000 "application " | TOPICAL_OINTMENT | Freq: Two times a day (BID) | CUTANEOUS | Status: DC

## 2017-02-12 MED ORDER — HEPARIN SODIUM (PORCINE) 1000 UNIT/ML IJ SOLN
INTRAMUSCULAR | Status: AC
  Filled 2017-02-12: qty 1

## 2017-02-12 MED ORDER — VANCOMYCIN HCL 1000 MG IV SOLR
INTRAVENOUS | Status: DC | PRN
  Administered 2017-02-12: 1000 mg via INTRAVENOUS

## 2017-02-12 MED ORDER — MUPIROCIN 2 % EX OINT
1.0000 "application " | TOPICAL_OINTMENT | Freq: Two times a day (BID) | CUTANEOUS | Status: DC
  Administered 2017-02-12 – 2017-02-13 (×2): 1 via NASAL
  Filled 2017-02-12: qty 22

## 2017-02-12 MED ORDER — ONDANSETRON HCL 4 MG/2ML IJ SOLN: 4.0000 mg | Freq: Four times a day (QID) | INTRAMUSCULAR | Status: DC | PRN

## 2017-02-12 MED ORDER — HEPARIN (PORCINE) IN NACL 2-0.9 UNIT/ML-% IJ SOLN
INTRAMUSCULAR | Status: AC
  Filled 2017-02-12: qty 1000

## 2017-02-12 MED ORDER — LIDOCAINE HCL (PF) 1 % IJ SOLN
INTRAMUSCULAR | Status: DC | PRN
  Administered 2017-02-12: 10 mL

## 2017-02-12 SURGICAL SUPPLY — 7 items
CABLE SURGICAL S-101-97-12 (CABLE) ×1 IMPLANT
ICD VISIA MRI DVFB1D1 (ICD Generator) ×1 IMPLANT
LEAD SPRINT QUAT SEC 6935-65CM (Lead) ×1 IMPLANT
PAD DEFIB LIFELINK (PAD) ×1 IMPLANT
SHEATH CLASSIC 7F (SHEATH) ×1 IMPLANT
SHEATH CLASSIC 9F (SHEATH) ×1 IMPLANT
TRAY PACEMAKER INSERTION (PACKS) ×1 IMPLANT

## 2017-02-12 NOTE — Consult Note (Signed)
102 events mild 4 800 and 5-FU mildly well   ELECTROPHYSIOLOGY CONSULT NOTE    Patient ID: Wayne [name] MRN: [id number], DOB/AGE: [birth year] 76 y.o.  Admit date: 02/11/2017 Date of Consult: 02/12/2017  Primary Physician: [name], MD Primary Cardiologist: Omar Person MD: Aundra Dubin  Reason for Consultation: cardiac arrest   HPI:  Wayne [name] is a 76 y.o. male with a past medical history significant for CAD s/p PCI 11/2016, persistent atrial fibrillation, ischemic cardiomyopathy, chronic systolic heart failure.  He was placed on a LifeVest post cardiac intervention 11/2016 and has done well.  He was at cardiac rehab yesterday and stood up to go to the bathroom before exercising. He developed profound dizziness and then lost consciousness.  His LifeVest shocked him once with ROSC.  Lifevest strips demonstrate monomorphic VT successfully terminated with ICD shock.  He currently denies chest pain, shortness of breath, LE edema, recent fevers, chills, nausea or vomiting. He reports compliance with medications.  He has some chest soreness post shock. EP has been asked to evaluate for treatment options.  Echo 09/2016 demonstrated EF 15%, diffuse hypokinesis.  Past Medical History:  Diagnosis Date  . Adenocarcinoma of prostate ([place])    s/p seed implants  . Arthritis   . Cellulitis of left leg    a. 0000000 complicated by septic shock  . Chronic combined systolic and diastolic CHF (congestive heart failure) ([place])   . Colon polyps   . CORONARY ATHEROSCLEROSIS NATIVE CORONARY ARTERY    a. 01/2011 Cath/PCI: LM nl, LAD 40-50p, D1 80-small, LCX 95-small, RI 90, RCA 100, EF 20%;  b. 01/2011 Card MRI - No transmural scar;  c. 01/2011 PCI RCA->5 Promus DES, RI->3.0x16 Promus DES; d. Cath 01/13/13 patent LAD & Ramus, diffuse LCx dz, RCA mult overlapping stents w/ 95% osital stenosis, EF 20%, s/p DES to ostial/prox RCA 01/24/13   . Diabetes mellitus, type 2 ([place])   . GERD (gastroesophageal reflux  disease)   . Hematoma of leg    a. left leg hematoma 03/2012 in the setting of asa/effient  . Herpes zoster ophthalmicus   . HYPERLIPIDEMIA    intolerant to Lipitor (myalgias)  . HYPERTENSION   . Ischemic cardiomyopathy    a. 01/2012 Echo EF 45%, mild LVH; b. [id number], grade 1 diastolic dysfunction, diffuse hypokinesis, inferoposterior akinesis   . Noncompliance   . Obesity   . OSA (obstructive sleep apnea)   . Polymyalgia rheumatica ([place])      Surgical History:  Past Surgical History:  Procedure Laterality Date  . CARDIAC CATHETERIZATION  01/24/2013  . CARDIAC CATHETERIZATION N/A 11/14/2016   Procedure: Right/Left Heart Cath and Coronary Angiography;  Surgeon: Larey Dresser, MD;  Location: [facility];  Service: Cardiovascular;  Laterality: N/A;  . CARDIAC CATHETERIZATION N/A 11/17/2016   Procedure: Coronary Stent Intervention w/Impella;  Surgeon: Peter M Martinique, MD;  Location: [facility];  Service: Cardiovascular;  Laterality: N/A;  . CARDIAC CATHETERIZATION N/A 11/17/2016   Procedure: Coronary Atherectomy;  Surgeon: Peter M Martinique, MD;  Location: [facility];  Service: Cardiovascular;  Laterality: N/A;  . CORONARY ANGIOPLASTY WITH STENT PLACEMENT  01/24/2013   DES to RCA  . CORONARY STENT PLACEMENT  2012   reports 6 stents placed  . I&D EXTREMITY  06/15/2012   Procedure: IRRIGATION AND DEBRIDEMENT EXTREMITY;  Surgeon: Newt Minion, MD;  Location: [facility];  Service: Orthopedics;  Laterality: Left;  I&D Left Posterior Knee  . I&D EXTREMITY  06/30/2012   Procedure: IRRIGATION AND DEBRIDEMENT EXTREMITY;  Surgeon: Newt Minion, MD;  Location: [facility];  Service: Orthopedics;  Laterality: Left;  Left Leg Irrigation and Debridement and placement of Wound VAC and application of  A-cell  . I&D EXTREMITY  07/20/2012   Procedure: IRRIGATION AND DEBRIDEMENT EXTREMITY;  Surgeon: Newt Minion, MD;  Location: [facility];  Service: Orthopedics;  Laterality: Left;  Irrigation and Debridement  Left Leg and Place antibiotic beads   . PERCUTANEOUS CORONARY STENT INTERVENTION (PCI-S) N/A 01/24/2013   Procedure: PERCUTANEOUS CORONARY STENT INTERVENTION (PCI-S);  Surgeon: Wellington Hampshire, MD;  Location: [facility];  Service: Cardiovascular;  Laterality: N/A;  . PROSTATE SURGERY     cancer, seed implant  . ULTRASOUND GUIDANCE FOR VASCULAR ACCESS  11/14/2016   Procedure: Ultrasound Guidance For Vascular Access;  Surgeon: Larey Dresser, MD;  Location: [facility];  Service: Cardiovascular;;     Prescriptions Prior to Admission  Medication Sig Dispense Refill Last Dose  . acetaminophen (TYLENOL) 500 MG tablet Take 500 mg by mouth daily as needed for moderate pain or headache.   Past Month at Unknown time  . amiodarone (PACERONE) 200 MG tablet 2 tabs bid x 1 week, then 1 tab BID x 1 week, then 1 tab daily 60 tablet 6 02/10/2017 at Unknown time  . carvedilol (COREG) 6.25 MG tablet Take 1 tablet (6.25 mg total) by mouth 2 (two) times daily. 60 tablet 2 02/11/2017 at 8atime  . digoxin (LANOXIN) 0.125 MG tablet Take 1 tablet (0.125 mg total) by mouth daily. 30 tablet 6 02/10/2017 at Unknown time  . diphenhydramine-acetaminophen (TYLENOL PM) 25-500 MG TABS tablet Take 1 tablet by mouth at bedtime as needed (sleep).   02/10/2017 at Unknown time  . furosemide (LASIX) 20 MG tablet Take by mouth. 80 mg in the AM and 60 mg in the PM   02/10/2017 at Unknown time  . glimepiride (AMARYL) 2 MG tablet Take 1 tablet (2 mg total) by mouth daily before breakfast. 30 tablet 3 02/11/2017 at Unknown time  . glucose blood (ACCU-CHEK AVIVA) test strip Check blood sugars 1-2 times per day. DX: E11.65 200 each 5 02/11/2017 at Unknown time  . Lancets (ACCU-CHEK SOFT TOUCH) lancets Check blood sugars 1-2 times per day. DX: E11.65 200 each 5 02/11/2017 at Unknown time  . losartan (COZAAR) 25 MG tablet Take 1 tablet (25 mg total) by mouth 2 (two) times daily. 60 tablet 6 02/11/2017 at Unknown time  . Multiple  Vitamins-Minerals (CENTRUM SILVER ADULT 50+) TABS Take 1 tablet by mouth daily.   02/10/2017 at Unknown time  . pravastatin (PRAVACHOL) 20 MG tablet Take 1 tablet (20 mg total) by mouth every evening. 30 tablet 2 02/11/2017 at Unknown time  . Rivaroxaban (XARELTO) 15 MG TABS tablet Take 1 tablet (15 mg total) by mouth daily. 30 tablet 3 02/10/2017 at 8a  . spironolactone (ALDACTONE) 25 MG tablet Take 1 tablet (25 mg total) by mouth daily. 30 tablet 6 02/11/2017 at Unknown time  . ticagrelor (BRILINTA) 60 MG TABS tablet Take 1 tablet (60 mg total) by mouth 2 (two) times daily. 60 tablet 6 02/11/2017 at Unknown time  . Evolocumab (REPATHA SURECLICK) [id number] MG/ML SOAJ Inject 1 pen into the skin every 14 (fourteen) days.   02/02/2017    Inpatient Medications:  . amiodarone  200 mg Oral BID  . carvedilol  6.25 mg Oral BID  . chlorhexidine      . digoxin  0.0625 mg Oral Daily  . glimepiride  2 mg Oral QAC breakfast  . insulin aspart  0-15 Units Subcutaneous TID WC  . insulin aspart  0-5 Units Subcutaneous QHS  . multivitamin with minerals  1 tablet Oral Daily  . pravastatin  20 mg Oral QPM  . sodium chloride flush  3 mL Intravenous Q12H  . ticagrelor  60 mg Oral BID    Allergies:  Allergies  Allergen Reactions  . Plavix [Clopidogrel]     Nose bleeds, swellings, whelps on legs & back, itching  . Lipitor [Atorvastatin] Other (See Comments)    REACTION: sore legs  . Crestor [Rosuvastatin Calcium] Other (See Comments)    Myalgias, Interfering with Gait  . Entresto [Sacubitril-Valsartan]     Angioedema  . Ancef [Cefazolin] Rash    Describes itching and rash, but said 'it wasn't that bad'    Social History   Social History  . Marital status: Married    Spouse name: N/A  . Number of children: 1  . Years of education: N/A   Occupational History  . 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 Smoking status: Former Smoker    Packs/day: 0.30    Years: 20.00    Types:  Cigarettes    Quit date: 02/23/1989  . Smokeless tobacco: Never Used  . Alcohol use No  . Drug use: No  . Sexual activity: Not on file   Other Topics Concern  . Not on file   Social History Narrative   Retired Administrator     Family History  Problem Relation Age of Onset  . Cancer Mother 58    unknown CA  . Heart disease Father   . Alcohol abuse Father   . Heart attack Father 39     Review of Systems: All other systems reviewed and are otherwise negative except as noted above.  Physical Exam: Vitals:   02/11/17 2209 02/11/17 2300 02/12/17 0300 02/12/17 0500  BP: 106/61 (!) 101/54    Pulse: 78 73 68   Resp: 17 16 14    Temp:  98.2 F (36.8 C)    TempSrc:  Oral Oral   SpO2: 96% 92% 94%   Weight:    215 lb 2.7 oz (97.6 kg)  Height:        GEN- The patient is well appearing, alert and oriented x 3 today.   HEENT: normocephalic, atraumatic; sclera clear, conjunctiva pink; hearing intact; oropharynx clear; neck supple  Lungs- Clear to ausculation bilaterally, normal work of breathing.  No wheezes, rales, rhonchi Heart- Regular rate and rhythm  GI- soft, non-tender, non-distended, bowel sounds present  Extremities- no clubbing, cyanosis, or edema  MS- no significant deformity or atrophy Skin- warm and dry, no rash or lesion Psych- euthymic mood, full affect Neuro- strength and sensation are intact  Labs:   Lab Results  Component Value Date   WBC 10.0 02/12/2017   HGB 12.7 (L) 02/12/2017   HCT 39.8 02/12/2017   MCV 87.1 02/12/2017   PLT 191 02/12/2017     Recent Labs Lab 02/11/17 1151 02/12/17 0636  NA 132* 134*  K 5.4* 5.2*  CL 93* 99*  CO2 25 25  BUN 45* 49*  CREATININE 1.84* 1.86*  CALCIUM 9.6 9.4  PROT 7.0  --   BILITOT 0.9  --   ALKPHOS 68  --   ALT 26  --   AST 27  --   GLUCOSE 437*  262*      Radiology/Studies: Dg Chest 2 View  Result Date: 01/16/2017 CLINICAL DATA:  New onset atrial fibrillation. EXAM: CHEST  2 VIEW COMPARISON:   11/15/2016 FINDINGS: Linear subsegmental atelectasis at the left base. Right lung is clear. Heart is normal size. No effusions or acute bony abnormality. IMPRESSION: Left base subsegmental atelectasis. Electronically Signed   By: Rolm Baptise M.D.   On: 01/16/2017 13:39   Ct Head Wo Contrast  Result Date: 02/11/2017 CLINICAL DATA:  Posttraumatic headache after fall. No reported loss of consciousness. EXAM: CT HEAD WITHOUT CONTRAST TECHNIQUE: Contiguous axial images were obtained from the base of the skull through the vertex without intravenous contrast. COMPARISON:  None. FINDINGS: Brain: No evidence of acute infarction, hemorrhage, hydrocephalus, extra-axial collection or mass lesion/mass effect. Vascular: No hyperdense vessel or unexpected calcification. Skull: Normal. Negative for fracture or focal lesion. Sinuses/Orbits: No acute finding. Other: Small right posterior parietal scalp hematoma is noted. IMPRESSION: Small right posterior parietal scalp hematoma. No acute intracranial abnormality seen. Electronically Signed   By: Marijo Conception, M.D.   On: 02/11/2017 13:37   Dg Chest Port 1 View  Result Date: 02/11/2017 CLINICAL DATA:  Chest pain and shortness of breath. EXAM: PORTABLE CHEST 1 VIEW COMPARISON:  01/16/2017. FINDINGS: Low lung volumes accentuate the heart size, which is probably increased. Prior LEFT base subsegmental atelectasis poorly visualized due to overlying telemetry leads and apparatus. No active infiltrates or failure. IMPRESSION: Limited exam, due to overlying telemetry leads and equipment. No active infiltrates or failure. Electronically Signed   By: Staci Righter M.D.   On: 02/11/2017 12:27    EKG: sinus rhythm, IVCD  TELEMETRY: sinus rhythm  Assessment/Plan: 1.  Hemodynamically unstable VT The patient developed hemodynamically unstable VT and was successfully defibrillated by his Life Vest in the setting of ischemic cardiomyopathy and chronically depressed EF.  He meets  criteria for ICD implant for secondary prevention.  Risks, benefits discussed with the patient who wishes to proceed. Will plan for later today With history of AF, will plan Medtronic device to allow for AF monitoring No driving x6 months Keep K>3.9, Mg >1.8  2.  Persistent atrial fibrillation Maintaining SR on amiodarone Continue Xarelto long term for CHADS2VASC of 3 Will hold peri-procedurally  3.  CAD No recent ischemic symptoms Continue medical therapy Troponin slightly elevated but trend flat  4.  Chronic systolic heart failure Euvolemic on exam Continue current therapy   Signed, Chanetta Marshall, NP 02/12/2017 7:50 AM   EP Attending  Patient seen and examined. Agree with the documentation above with minimal modification. The patient is status post myocardial infarction a couple of months ago who is going to cardiac rehabilitation and had syncope followed by an appropriate shock by way of his LifeVest for hemodynamically unstable ventricular tachycardia. There is no reversible cause. The patient physical exam demonstrates a regular rate and rhythm and clear lungs and his extremities demonstrate no peripheral edema. Neurologically he is normal. His electrocardiogram demonstrates sinus rhythm with an intraventricular conduction delay. Chest x-ray and labs have been reviewed. Impression 1. Hemodynamically unstable ventricular tachycardia, 2 months after acute myocardial infarction 2. Coronary artery disease status post prior MI Recommendation: ICD implantation for secondary prevention of malignant ventricular arrhythmias is recommended. I discussed the risk, goals, benefits, and expectations of the procedure with the patient and he wishes to proceed.  [name], M.D.

## 2017-02-12 NOTE — Progress Notes (Signed)
Orthopedic Tech Progress Note Patient Details:  Wayne [name] September 26, 1941 [id number]  Ortho Devices Type of Ortho Device: Arm sling   [name] 02/12/2017, 3:16 PM

## 2017-02-12 NOTE — Progress Notes (Addendum)
Patient ID: Wayne [name], male   DOB: 1941/02/19, 76 y.o.   MRN: [id number]   SUBJECTIVE: No complaints this morning.  SBP soft overnight, now improved.  Creatinine above baseline and K mildly elevated. Still with pain in the back of his head.   CT head: Small posterior scalp hematoma.    Scheduled Meds: . [MAR Hold] amiodarone  200 mg Oral BID  . [MAR Hold] carvedilol  6.25 mg Oral BID  . chlorhexidine  60 mL Topical Once  . chlorhexidine  60 mL Topical Once  . chlorhexidine      . [MAR Hold] digoxin  0.0625 mg Oral Daily  . gentamicin irrigation  80 mg Irrigation To Cath  . [MAR Hold] glimepiride  2 mg Oral QAC breakfast  . [MAR Hold] insulin aspart  0-15 Units Subcutaneous TID WC  . [MAR Hold] insulin aspart  0-5 Units Subcutaneous QHS  . [MAR Hold] multivitamin with minerals  1 tablet Oral Daily  . [MAR Hold] pravastatin  20 mg Oral QPM  . [MAR Hold] sodium chloride flush  3 mL Intravenous Q12H  . [MAR Hold] ticagrelor  60 mg Oral BID  . vancomycin  1,000 mg Intravenous To Cath   Continuous Infusions: . sodium chloride     PRN Meds:.[MAR Hold] sodium chloride, [MAR Hold] acetaminophen, fentaNYL, midazolam, [MAR Hold] ondansetron (ZOFRAN) IV, [MAR Hold] sodium chloride flush, vancomycin (VANCOCIN) 1000 mg/277mL IVPB    Vitals:   02/11/17 2300 02/12/17 0300 02/12/17 0500 02/12/17 0800  BP: (!) 101/54   (!) 105/55  Pulse: 73 68  65  Resp: 16 14    Temp: 98.2 F (36.8 C)     TempSrc: Oral Oral    SpO2: 92% 94%  96%  Weight:   215 lb 2.7 oz (97.6 kg)   Height:        Intake/Output Summary (Last 24 hours) at 02/12/17 0929 Last data filed at 02/11/17 2250  Gross per 24 hour  Intake                0 ml  Output             1525 ml  Net            -1525 ml    LABS: Basic Metabolic Panel:  Recent Labs  02/11/17 1151 02/12/17 0636  NA 132* 134*  K 5.4* 5.2*  CL 93* 99*  CO2 25 25  GLUCOSE 437* 262*  BUN 45* 49*  CREATININE 1.84* 1.86*  CALCIUM 9.6 9.4  MG 2.5*  2.5*   Liver Function Tests:  Recent Labs  02/11/17 1151  AST 27  ALT 26  ALKPHOS 68  BILITOT 0.9  PROT 7.0  ALBUMIN 4.0   No results for input(s): LIPASE, AMYLASE in the last 72 hours. CBC:  Recent Labs  02/11/17 1151 02/12/17 0636  WBC 10.1 10.0  NEUTROABS  --  6.8  HGB 13.4 12.7*  HCT 40.4 39.8  MCV 86.7 87.1  PLT 192 191   Cardiac Enzymes:  Recent Labs  02/11/17 1908 02/12/17 0009 02/12/17 0636  TROPONINI 0.10* 0.11* 0.10*   BNP: Invalid input(s): POCBNP D-Dimer: No results for input(s): DDIMER in the last 72 hours. Hemoglobin A1C: No results for input(s): HGBA1C in the last 72 hours. Fasting Lipid Panel: No results for input(s): CHOL, HDL, LDLCALC, TRIG, CHOLHDL, LDLDIRECT in the last 72 hours. Thyroid Function Tests: No results for input(s): TSH, T4TOTAL, T3FREE, THYROIDAB in the last 72 hours.  Invalid input(s): FREET3 Anemia Panel: No results for input(s): VITAMINB12, FOLATE, FERRITIN, TIBC, IRON, RETICCTPCT in the last 72 hours.  RADIOLOGY: Dg Chest 2 View  Result Date: 01/16/2017 CLINICAL DATA:  New onset atrial fibrillation. EXAM: CHEST  2 VIEW COMPARISON:  11/15/2016 FINDINGS: Linear subsegmental atelectasis at the left base. Right lung is clear. Heart is normal size. No effusions or acute bony abnormality. IMPRESSION: Left base subsegmental atelectasis. Electronically Signed   By: Rolm Baptise M.D.   On: 01/16/2017 13:39   Ct Head Wo Contrast  Result Date: 02/11/2017 CLINICAL DATA:  Posttraumatic headache after fall. No reported loss of consciousness. EXAM: CT HEAD WITHOUT CONTRAST TECHNIQUE: Contiguous axial images were obtained from the base of the skull through the vertex without intravenous contrast. COMPARISON:  None. FINDINGS: Brain: No evidence of acute infarction, hemorrhage, hydrocephalus, extra-axial collection or mass lesion/mass effect. Vascular: No hyperdense vessel or unexpected calcification. Skull: Normal. Negative for fracture or  focal lesion. Sinuses/Orbits: No acute finding. Other: Small right posterior parietal scalp hematoma is noted. IMPRESSION: Small right posterior parietal scalp hematoma. No acute intracranial abnormality seen. Electronically Signed   By: Marijo Conception, M.D.   On: 02/11/2017 13:37   Dg Chest Port 1 View  Result Date: 02/11/2017 CLINICAL DATA:  Chest pain and shortness of breath. EXAM: PORTABLE CHEST 1 VIEW COMPARISON:  01/16/2017. FINDINGS: Low lung volumes accentuate the heart size, which is probably increased. Prior LEFT base subsegmental atelectasis poorly visualized due to overlying telemetry leads and apparatus. No active infiltrates or failure. IMPRESSION: Limited exam, due to overlying telemetry leads and equipment. No active infiltrates or failure. Electronically Signed   By: Staci Righter M.D.   On: 02/11/2017 12:27    PHYSICAL EXAM General: NAD Neck: No JVD, no thyromegaly or thyroid nodule.  Lungs: Clear to auscultation bilaterally with normal respiratory effort. CV: Nondisplaced PMI.  Heart regular S1/S2, no S3/S4, no murmur.  No peripheral edema.  No carotid bruit.  Normal pedal pulses.  Abdomen: Soft, nontender, no hepatosplenomegaly, no distention.  Neurologic: Alert and oriented x 3.  Psych: Normal affect. Extremities: No clubbing or cyanosis.   TELEMETRY: Reviewed personally, pt in NSR, no further VT  ASSESSMENT AND PLAN: 76 yo with history of CAD, ischemic cardiomyopathy/chronic systolic CHF, diabetes presented with VT arrest/Lifevest shock.  1. VT: Patient had VT with syncope on 2/28, had appropriate Lifevest shock with immediate ROSC.  I suspect that VT was scar mediated, no prior chest pain or increased dyspnea.  ECG unchanged.  TnI mildly elevated at 0.1 with no trend, likely due to defibrillation.  - Plan for ICD placement today for secondary prevention (do not think QRS wide enough to benefit from CRT). - Amiodarone 200 mg bid x 1 week then back to daily.  2. Chronic  systolic CHF: Ischemic cardiomyopathy.  10/17 echo with EF 15%, diffuse hypokinesis.  CPX 10/17 with severe HF limitation.  RHC in 12/17 with elevated filling pressures and low cardiac output. He is now s/p PCI to proximal to mid LAD in 12/17 with much improved symptoms, NYHA class II.  Since this admission, BP has been soft with creatinine elevated from baseline and and mildly elevated K.  He does not look volume overloaded, possibly mildly dehydrated.  - Lasix on hold for now.  - Holding losartan and spironolactone with elevated creatinine and K.  Hopefully restart soon.  - Decrease digoxin to 0.0625 with elevated level.  - Continue Coreg.  3. Atrial fibrillation: Paroxysmal.  In  NSR on amiodarone.  Had been on Xarelto 15 mg daily as he is also on ticagrelor.  Xarelto held today for ICD placement, will restart when OK with EP.  4. CAD: PCI to proximal LAD in 12/17.  Occluded RCA with left to right collaterals.  He is on ticagrelor 60 mg bid and Xarelto 15 daily.  Unusual regimen due to PAF and LAD PCI in 12/17.  He is allergic to Plavix, we can no longer get ticlopidine which he took in the past, and he had a large gastrocnemius bleed with Effient.  Therefore, decided to follow [facility] data in a modified way with the above doses of Xarelto and ticagrelor.  - Will hold Xarelto pre-ICD and continue ticagrelor 60 mg bid (lower dose than usual 90 bid).  Resume Xarelto post-procedure when ok with EP.  - Continue pravastatin, needs to start on Repatha.  5. Diabetes: Currently covering with SSI, resume home meds after procedure today.   Loralie Champagne 02/12/2017 9:40 AM

## 2017-02-12 NOTE — Progress Notes (Signed)
Inpatient Diabetes Program Recommendations  AACE/ADA: New Consensus Statement on Inpatient Glycemic Control (2015)  Target Ranges:  Prepandial:   less than 140 mg/dL      Peak postprandial:   less than 180 mg/dL (1-2 hours)      Critically ill patients:  140 - 180 mg/dL   Lab Results  Component Value Date   GLUCAP 297 (H) 02/12/2017   HGBA1C 8.7 (H) 07/14/2016    Review of Glycemic Control Results for Wayne [name], Wayne [name] (MRN [id number]) as of 02/12/2017 15:25  Ref. Range 02/11/2017 11:00 02/11/2017 16:31 02/11/2017 22:07 02/12/2017 12:15  Glucose-Capillary Latest Ref Range: 65 - 99 mg/dL 479 (H) 312 (H) 209 (H) 297 (H)   Diabetes history: DM2 Outpatient Diabetes medications: Amaryl 2 mg qd Current orders for Inpatient glycemic control: Amaryl 2 mg qd + Novolog correction 0-15 units tid + 0-5 units hs  Inpatient Diabetes Program Recommendations:  Please consider: -A1c to determine prehospital glycemic control -Hold oral Amaryl while in the hospital -Lantus (0.2 units/kg x 97.6 kg = 19 units) daily while Amaryl on hold Will follow.  Thank you, Nani Gasser. Jamil Armwood, RN, MSN, CDE Inpatient Glycemic Control Team Team Pager [phone] (8am-5pm) 02/12/2017 3:29 PM

## 2017-02-12 NOTE — Progress Notes (Signed)
Transferred to the cath. Lab by bed.

## 2017-02-12 NOTE — Discharge Instructions (Signed)
° ° °  Supplemental Discharge Instructions for  Pacemaker/Defibrillator Patients  Activity No heavy lifting or vigorous activity with your left/right arm for 6 to 8 weeks.  Do not raise your left/right arm above your head for one week.  Gradually raise your affected arm as drawn below.           __         02/16/17                     02/17/17                         02/18/17                     02/19/17  NO DRIVING for   6 months   WOUND CARE - Keep the wound area clean and dry.  Do not get this area wet for one week. No showers for one week; you may shower on    02/19/17 . - The tape/steri-strips on your wound will fall off; do not pull them off.  No bandage is needed on the site.  DO  NOT apply any creams, oils, or ointments to the wound area. - If you notice any drainage or discharge from the wound, any swelling or bruising at the site, or you develop a fever > 101? F after you are discharged home, call the office at once.  Special Instructions - You are still able to use cellular telephones; use the ear opposite the side where you have your pacemaker/defibrillator.  Avoid carrying your cellular phone near your device. - When traveling through airports, show security personnel your identification card to avoid being screened in the metal detectors.  Ask the security personnel to use the hand wand. - Avoid arc welding equipment, TENS units (transcutaneous nerve stimulators).  Call the office for questions about other devices. - Avoid electrical appliances that are in poor condition or are not properly grounded. - Microwave ovens are safe to be near or to operate.  Additional information for defibrillator patients should your device go off: - If your device goes off ONCE and you feel fine afterward, notify the device clinic nurses. - If your device goes off ONCE and you do not feel well afterward, call 911. - If your device goes off TWICE, call 911. - If your device goes off THREE times in one  day, call 911.  DO NOT DRIVE YOURSELF OR A FAMILY MEMBER WITH A DEFIBRILLATOR TO THE HOSPITAL--CALL 911.

## 2017-02-12 NOTE — Progress Notes (Signed)
Back from the cath lab by bed awake and alert, arm sling to left intact, dressing to left shoulder dry and intact.

## 2017-02-13 ENCOUNTER — Encounter (HOSPITAL_COMMUNITY): Payer: Medicare Other

## 2017-02-13 ENCOUNTER — Inpatient Hospital Stay (HOSPITAL_COMMUNITY): Payer: Medicare Other

## 2017-02-13 ENCOUNTER — Ambulatory Visit (HOSPITAL_COMMUNITY): Payer: Medicare Other

## 2017-02-13 DIAGNOSIS — Z4502 Encounter for adjustment and management of automatic implantable cardiac defibrillator: Secondary | ICD-10-CM

## 2017-02-13 LAB — CBC WITH DIFFERENTIAL/PLATELET
Basophils Absolute: 0 10*3/uL (ref 0.0–0.1)
Basophils Relative: 0 %
Eosinophils Absolute: 0.7 10*3/uL (ref 0.0–0.7)
Eosinophils Relative: 8 %
HEMATOCRIT: 38.4 % — AB (ref 39.0–52.0)
HEMOGLOBIN: 12.6 g/dL — AB (ref 13.0–17.0)
LYMPHS ABS: 1.9 10*3/uL (ref 0.7–4.0)
Lymphocytes Relative: 20 %
MCH: 28.6 pg (ref 26.0–34.0)
MCHC: 32.8 g/dL (ref 30.0–36.0)
MCV: 87.3 fL (ref 78.0–100.0)
MONOS PCT: 7 %
Monocytes Absolute: 0.7 10*3/uL (ref 0.1–1.0)
Neutro Abs: 6.1 10*3/uL (ref 1.7–7.7)
Neutrophils Relative %: 65 %
Platelets: 175 10*3/uL (ref 150–400)
RBC: 4.4 MIL/uL (ref 4.22–5.81)
RDW: 14.6 % (ref 11.5–15.5)
WBC: 9.4 10*3/uL (ref 4.0–10.5)

## 2017-02-13 LAB — BASIC METABOLIC PANEL
Anion gap: 9 (ref 5–15)
BUN: 46 mg/dL — AB (ref 6–20)
CHLORIDE: 98 mmol/L — AB (ref 101–111)
CO2: 26 mmol/L (ref 22–32)
CREATININE: 1.73 mg/dL — AB (ref 0.61–1.24)
Calcium: 9.7 mg/dL (ref 8.9–10.3)
GFR calc non Af Amer: 37 mL/min — ABNORMAL LOW (ref >60)
GFR, EST AFRICAN AMERICAN: 43 mL/min — AB (ref >60)
GLUCOSE: 153 mg/dL — AB (ref 65–99)
Potassium: 4.9 mmol/L (ref 3.5–5.1)
Sodium: 133 mmol/L — ABNORMAL LOW (ref 135–145)

## 2017-02-13 LAB — GLUCOSE, CAPILLARY: GLUCOSE-CAPILLARY: 222 mg/dL — AB (ref 65–99)

## 2017-02-13 MED ORDER — LOSARTAN POTASSIUM 25 MG PO TABS
25.0000 mg | ORAL_TABLET | Freq: Two times a day (BID) | ORAL | Status: DC
  Administered 2017-02-13: 25 mg via ORAL
  Filled 2017-02-13: qty 1

## 2017-02-13 MED ORDER — DIGOXIN 125 MCG PO TABS: 0.0625 mg | ORAL_TABLET | Freq: Every day | ORAL | 6 refills | Status: DC

## 2017-02-13 MED ORDER — AMIODARONE HCL 200 MG PO TABS: ORAL_TABLET | ORAL | 6 refills | Status: DC

## 2017-02-13 MED ORDER — DIGOXIN 62.5 MCG PO TABS
0.0625 mg | ORAL_TABLET | Freq: Every day | ORAL | 6 refills | Status: DC
Start: 2017-02-14 — End: 2017-02-13

## 2017-02-13 MED ORDER — RIVAROXABAN 15 MG PO TABS: 15.0000 mg | ORAL_TABLET | Freq: Every day | ORAL | 3 refills | Status: DC

## 2017-02-13 MED ORDER — FUROSEMIDE 20 MG PO TABS: 80.0000 mg | ORAL_TABLET | Freq: Every day | ORAL | 6 refills | Status: DC

## 2017-02-13 MED ORDER — TRAMADOL HCL 50 MG PO TABS: 50.0000 mg | ORAL_TABLET | Freq: Four times a day (QID) | ORAL | 0 refills | Status: DC | PRN

## 2017-02-13 MED ORDER — SPIRONOLACTONE 25 MG PO TABS: 12.5000 mg | ORAL_TABLET | Freq: Every day | ORAL | 6 refills | Status: DC

## 2017-02-13 MED FILL — Heparin Sodium (Porcine) 2 Unit/ML in Sodium Chloride 0.9%: INTRAMUSCULAR | Qty: 500 | Status: AC

## 2017-02-13 MED FILL — Gentamicin Sulfate Inj 40 MG/ML: INTRAMUSCULAR | Qty: 2 | Status: AC

## 2017-02-13 MED FILL — Sodium Chloride Irrigation Soln 0.9%: Qty: 500 | Status: AC

## 2017-02-13 NOTE — Care Management Important Message (Signed)
Important Message  Patient Details  Name: Wayne [name] MRN: [id number] Date of Birth: 1941-08-10   Medicare Important Message Given:  Yes    Erenest Rasher, RN 02/13/2017, 10:54 AM

## 2017-02-13 NOTE — Care Management Note (Addendum)
Case Management Note  Patient Details  Name: Wayne [name] MRN: [id number] Date of Birth: [birth year]  Subjective/Objective:    ICD implanted yesterday 02/12/17                 Action/Plan: Discharge Planning: AVS reviewed: NCM spoke to pt and lives with wife in home. States he was independent prior to hospital. Wife will drive him to his appts for the period he will not drive. Pt states he does not have Medicare Part D. He is receiving his Xarelto from drug patient assistance program. Spoke to attending and Digoxin 0.0625 is $451 at pharmacy. Rx changed to .125 mg and pt is to split pill.    PCP [name] MD  Expected Discharge Date:  02/13/17               Expected Discharge Plan:  Home/Self Care  In-House Referral:  NA  Discharge planning Services  CM Consult  Post Acute Care Choice:  NA Choice offered to:  NA  DME Arranged:  N/A DME Agency:  NA  HH Arranged:  NA HH Agency:  NA  Status of Service:  Completed, signed off  If discussed at [place] of Stay Meetings, dates discussed:    Additional Comments:  Erenest Rasher, RN 02/13/2017, 10:54 AM

## 2017-02-13 NOTE — H&P (Signed)
ICD Criteria  Current LVEF:15%. Within 12 months prior to implant: Yes   Heart failure history: Yes, Class II  Cardiomyopathy history: Yes, Ischemic Cardiomyopathy - Prior MI.  Atrial Fibrillation/Atrial Flutter: No.  Ventricular tachycardia history: Yes, Hemodynamic instability present. VT Type: Sustained Ventricular Tachycardia - Monomorphic.  Cardiac arrest history: No.  History of syndromes with risk of sudden death: No.  Previous ICD: No.  Current ICD indication: Secondary  PPM indication: No.   Class I or II Bradycardia indication present: No  Beta Blocker therapy for 3 or more months: Yes, prescribed.   Ace Inhibitor/ARB therapy for 3 or more months: Yes, prescribed.

## 2017-02-13 NOTE — Progress Notes (Addendum)
SUBJECTIVE: The patient is doing well today.  At this time, he denies any anginal chest pain, chest wall soreness s/p defbrillation improving, shortness of breath, or any new concerns.  Marland Kitchen amiodarone  200 mg Oral BID  . carvedilol  6.25 mg Oral BID  . Chlorhexidine Gluconate Cloth  6 each Topical Daily  . digoxin  0.0625 mg Oral Daily  . glimepiride  2 mg Oral QAC breakfast  . insulin aspart  0-15 Units Subcutaneous TID WC  . insulin aspart  0-5 Units Subcutaneous QHS  . losartan  25 mg Oral BID  . multivitamin with minerals  1 tablet Oral Daily  . mupirocin ointment  1 application Nasal BID  . pravastatin  20 mg Oral QPM  . sodium chloride flush  3 mL Intravenous Q12H  . ticagrelor  60 mg Oral BID     OBJECTIVE: Physical Exam: Vitals:   02/13/17 0600 02/13/17 0700 02/13/17 0800 02/13/17 0802  BP: (!) 122/58 134/63 (!) 111/55   Pulse: 66 75 68   Resp: 14 16 12    Temp:    97.7 F (36.5 C)  TempSrc:    Oral  SpO2: 98% 97% 96%   Weight:      Height:        Intake/Output Summary (Last 24 hours) at 02/13/17 0914 Last data filed at 02/13/17 0802  Gross per 24 hour  Intake              390 ml  Output              900 ml  Net             -510 ml    Telemetry is reviewed by myself, SR   GEN- The patient is well appearing, alert and oriented x 3 today.   Head- normocephalic, atraumatic Eyes-  Sclera clear, conjunctiva pink Ears- hearing intact Oropharynx- clear Neck- supple, no JVP Lungs- CTA b/l, normal work of breathing Heart- RRR, no significant murmurs, no rubs or gallops GI- soft, NT, ND Extremities- no clubbing, cyanosis, or edema Skin- no rash or lesion Psych- euthymic mood, full affect Neuro- no gross deficits appreciated  ICD implant site is stable, dry, no hematoma  LABS: Basic Metabolic Panel:  Recent Labs  02/11/17 1151 02/12/17 0636 02/13/17 0305  NA 132* 134* 133*  K 5.4* 5.2* 4.9  CL 93* 99* 98*  CO2 25 25 26   GLUCOSE 437* 262* 153*    BUN 45* 49* 46*  CREATININE 1.84* 1.86* 1.73*  CALCIUM 9.6 9.4 9.7  MG 2.5* 2.5*  --    Liver Function Tests:  Recent Labs  02/11/17 1151  AST 27  ALT 26  ALKPHOS 68  BILITOT 0.9  PROT 7.0  ALBUMIN 4.0   CBC:  Recent Labs  02/12/17 0636 02/13/17 0305  WBC 10.0 9.4  NEUTROABS 6.8 6.1  HGB 12.7* 12.6*  HCT 39.8 38.4*  MCV 87.1 87.3  PLT 191 175   Cardiac Enzymes:  Recent Labs  02/11/17 1908 02/12/17 0009 02/12/17 0636  TROPONINI 0.10* 0.11* 0.10*       ASSESSMENT AND PLAN:   1.  Hemodynamically unstable VT The patient developed hemodynamically unstable VT and was successfully defibrillated by his Life Vest in the setting of ischemic cardiomyopathy and chronically depressed EF.   He meets criteria for ICD implant for secondary prevention.  ICD implanted yesterday 02/12/17 by Dr. Lovena Le Device check this morning with intact function CXR is pending Wound  care and activity restrictions were reviewed with the patient Routine post implant follow up has been arranged  No driving x6 months, I discussed this with the patient, he is aware Keep K>3.9, Mg >1.8  Pending CXR this morning  2.  Persistent atrial fibrillation Maintaining SR on amiodarone Continue Xarelto long term for CHADS2VASC of 3, to resume tomorrow, 02/14/17   3.  CAD No recent ischemic symptoms Continue medical therapy Troponin slightly elevated but trend flat C/w primary cardiology team  4.  Chronic systolic heart failure Euvolemic on exam Continue current therapy with primary team   Tommye Standard, PA-C 02/13/2017 9:14 AM  EP Attending  Patient seen and examined. Agree with above. He is stable for DC home. Will follow his R waves.   Mikle Bosworth.D.

## 2017-02-13 NOTE — Progress Notes (Signed)
Patient ID: Wayne [name], male   DOB: 02-06-1941, 76 y.o.   MRN: [id number]   SUBJECTIVE: No complaints this morning.  BP higher.  Creatinine and K trending down.  Had Medtronic ICD placed yesterday, mild pain at ICD site.   CT head: Small posterior scalp hematoma.    Scheduled Meds: . amiodarone  200 mg Oral BID  . carvedilol  6.25 mg Oral BID  . Chlorhexidine Gluconate Cloth  6 each Topical Daily  . digoxin  0.0625 mg Oral Daily  . glimepiride  2 mg Oral QAC breakfast  . insulin aspart  0-15 Units Subcutaneous TID WC  . insulin aspart  0-5 Units Subcutaneous QHS  . losartan  25 mg Oral BID  . multivitamin with minerals  1 tablet Oral Daily  . mupirocin ointment  1 application Nasal BID  . pravastatin  20 mg Oral QPM  . sodium chloride flush  3 mL Intravenous Q12H  . ticagrelor  60 mg Oral BID   Continuous Infusions:  PRN Meds:.sodium chloride, acetaminophen, ondansetron (ZOFRAN) IV, ondansetron (ZOFRAN) IV, sodium chloride flush, traMADol    Vitals:   02/13/17 0400 02/13/17 0500 02/13/17 0600 02/13/17 0700  BP: 110/77 (!) 108/52 (!) 122/58 134/63  Pulse: 67 64 66 75  Resp: 16 12 14 16   Temp:      TempSrc:      SpO2: 93% 96% 98% 97%  Weight:      Height:        Intake/Output Summary (Last 24 hours) at 02/13/17 0758 Last data filed at 02/13/17 0300  Gross per 24 hour  Intake              390 ml  Output              750 ml  Net             -360 ml    LABS: Basic Metabolic Panel:  Recent Labs  02/11/17 1151 02/12/17 0636 02/13/17 0305  NA 132* 134* 133*  K 5.4* 5.2* 4.9  CL 93* 99* 98*  CO2 25 25 26   GLUCOSE 437* 262* 153*  BUN 45* 49* 46*  CREATININE 1.84* 1.86* 1.73*  CALCIUM 9.6 9.4 9.7  MG 2.5* 2.5*  --    Liver Function Tests:  Recent Labs  02/11/17 1151  AST 27  ALT 26  ALKPHOS 68  BILITOT 0.9  PROT 7.0  ALBUMIN 4.0   No results for input(s): LIPASE, AMYLASE in the last 72 hours. CBC:  Recent Labs  02/12/17 0636 02/13/17 0305  WBC  10.0 9.4  NEUTROABS 6.8 6.1  HGB 12.7* 12.6*  HCT 39.8 38.4*  MCV 87.1 87.3  PLT 191 175   Cardiac Enzymes:  Recent Labs  02/11/17 1908 02/12/17 0009 02/12/17 0636  TROPONINI 0.10* 0.11* 0.10*   BNP: Invalid input(s): POCBNP D-Dimer: No results for input(s): DDIMER in the last 72 hours. Hemoglobin A1C: No results for input(s): HGBA1C in the last 72 hours. Fasting Lipid Panel: No results for input(s): CHOL, HDL, LDLCALC, TRIG, CHOLHDL, LDLDIRECT in the last 72 hours. Thyroid Function Tests: No results for input(s): TSH, T4TOTAL, T3FREE, THYROIDAB in the last 72 hours.  Invalid input(s): FREET3 Anemia Panel: No results for input(s): VITAMINB12, FOLATE, FERRITIN, TIBC, IRON, RETICCTPCT in the last 72 hours.  RADIOLOGY: Dg Chest 2 View  Result Date: 01/16/2017 CLINICAL DATA:  New onset atrial fibrillation. EXAM: CHEST  2 VIEW COMPARISON:  11/15/2016 FINDINGS: Linear subsegmental atelectasis at the left  base. Right lung is clear. Heart is normal size. No effusions or acute bony abnormality. IMPRESSION: Left base subsegmental atelectasis. Electronically Signed   By: Rolm Baptise M.D.   On: 01/16/2017 13:39   Ct Head Wo Contrast  Result Date: 02/11/2017 CLINICAL DATA:  Posttraumatic headache after fall. No reported loss of consciousness. EXAM: CT HEAD WITHOUT CONTRAST TECHNIQUE: Contiguous axial images were obtained from the base of the skull through the vertex without intravenous contrast. COMPARISON:  None. FINDINGS: Brain: No evidence of acute infarction, hemorrhage, hydrocephalus, extra-axial collection or mass lesion/mass effect. Vascular: No hyperdense vessel or unexpected calcification. Skull: Normal. Negative for fracture or focal lesion. Sinuses/Orbits: No acute finding. Other: Small right posterior parietal scalp hematoma is noted. IMPRESSION: Small right posterior parietal scalp hematoma. No acute intracranial abnormality seen. Electronically Signed   By: Marijo Conception,  M.D.   On: 02/11/2017 13:37   Dg Chest Port 1 View  Result Date: 02/11/2017 CLINICAL DATA:  Chest pain and shortness of breath. EXAM: PORTABLE CHEST 1 VIEW COMPARISON:  01/16/2017. FINDINGS: Low lung volumes accentuate the heart size, which is probably increased. Prior LEFT base subsegmental atelectasis poorly visualized due to overlying telemetry leads and apparatus. No active infiltrates or failure. IMPRESSION: Limited exam, due to overlying telemetry leads and equipment. No active infiltrates or failure. Electronically Signed   By: Staci Righter M.D.   On: 02/11/2017 12:27    PHYSICAL EXAM General: NAD Neck: No JVD, no thyromegaly or thyroid nodule.  Lungs: Clear to auscultation bilaterally with normal respiratory effort. CV: Nondisplaced PMI.  Heart regular S1/S2, no S3/S4, no murmur.  No peripheral edema.  No carotid bruit.  Normal pedal pulses.  Abdomen: Soft, nontender, no hepatosplenomegaly, no distention.  Neurologic: Alert and oriented x 3.  Psych: Normal affect. Extremities: No clubbing or cyanosis.  Skin: ICD pocket site benign.   TELEMETRY: Reviewed personally, pt in NSR, no further VT  ASSESSMENT AND PLAN: 76 yo with history of CAD, ischemic cardiomyopathy/chronic systolic CHF, diabetes presented with VT arrest/Lifevest shock.  1. VT: Patient had VT with syncope on 2/28, had appropriate Lifevest shock with immediate ROSC.  I suspect that VT was scar mediated, no prior chest pain or increased dyspnea.  ECG unchanged.  TnI mildly elevated at 0.1 with no trend, likely due to defibrillation.  - Now s/p Medtronic ICD. - Amiodarone 200 mg bid x 1 week then back to daily.  - No driving x 6 months.  2. Chronic systolic CHF: Ischemic cardiomyopathy.  10/17 echo with EF 15%, diffuse hypokinesis.  CPX 10/17 with severe HF limitation.  RHC in 12/17 with elevated filling pressures and low cardiac output. He is now s/p PCI to proximal to mid LAD in 12/17 with much improved symptoms, NYHA  class II.  This admission, BP has been soft with creatinine elevated from baseline and and mildly elevated K.  He does not look volume overloaded. K and creatinine now coming down.  - Lasix on hold for now.  Will send home on lower dose, 80 mg daily.  - Restart losartan today.   - Decrease digoxin to 0.0625 with elevated level.  - Continue Coreg.  3. Atrial fibrillation: Paroxysmal.  In NSR on amiodarone.  Had been on Xarelto 15 mg daily as he is also on ticagrelor.  Xarelto held for ICD placement, will restart when OK with EP.  Pocket site looks ok.  4. CAD: PCI to proximal LAD in 12/17.  Occluded RCA with left to right  collaterals.  He is on ticagrelor 60 mg bid and Xarelto 15 daily.  Unusual regimen due to PAF and LAD PCI in 12/17.  He is allergic to Plavix, we can no longer get ticlopidine which he took in the past, and he had a large gastrocnemius bleed with Effient.  Therefore, decided to follow [facility] data in a modified way with the above doses of Xarelto and ticagrelor.  - Continue ticagrelor 60 mg bid (lower dose than usual 90 bid).  Resume Xarelto when ok with EP.  - Continue pravastatin, needs to start on Repatha.  5. Diabetes: Currently covering with SSI, resume home meds after procedure today.  6. Disposition: Home today.  Followup with me in about 2 wks.  Will need BMET next week.  Followup with EP as scheduled.  Will send home on spironolactone 12.5 daily (lower dose), digoxin 0.0625 daily (lower dose), Lasix 80 mg daily (lower dose), losartan 25 mg bid, Coreg 6.25 mg bid, ticagrelor 60 mg bid, Xarelto 15 mg daily (start tomorrow), amiodarone 200 mg bid to complete a week then 200 mg daily, diabetes meds as per home regimen.   Loralie Champagne 02/13/2017 7:58 AM

## 2017-02-13 NOTE — Discharge Summary (Signed)
Advanced Heart Failure Discharge Note  Discharge Summary   Patient ID: Wayne [name] MRN: [id number], DOB/AGE: [birth year] 74 y.o. Admit date: 02/11/2017 D/C date:     02/13/2017   Primary Discharge Diagnoses:  1. VT with syncope and Lifevest shock (confirmed via [place] interrogation) 2. Chronic systolic CHF s/p Medtronic ICD 02/12/17 3. Paroxysmal Atrial fibrillation 4. CAD 5. DM2 6. Hyperkalemia 7. AKI on CKD III   Hospital Course:   Wayne [name] is a 76 y.o. male with h/o DM2, HTN, CAD s/p DES to RCA 2012 with DES to proximal LAD [id number], Systolic CHF EF 0000000 due to ICM. Pt [facility] to [facility]/11[facility]17 after syncope and Lifevest shock while waiting to start Cardiac Rehab. Pt noted to hit head with fall.  Head CT performed and showed small posterior scalp hematoma, but no intracranial injury or changes.   Lifevest interrogation revealed + VT with appropriate shock.  Pt stable from HF perspective.  EP consulted and agreed pt good candidate for ICD for secondary prevention.   Pt underwent ICD placement 02/12/17 with Medtronic ICD without complication. Xarelto held for ICD placement and to be resumed 02/14/17.   Hospital course complicated by hyperkalemia in setting of mild AKI.  HF medications adjusted and close lab follow up scheduled. Diuretics held and chronic dose decreased.  No CP or ACS complications this admission.   Pt seen this am and thought to be stable for discharge with follow up as below. CXR prior to discharge with NO pneumothorax s/p ICD placement.   Discharge Weight Range: 224 lb Discharge Vitals: Blood pressure (!) 111/55, pulse 68, temperature 97.7 F (36.5 C), temperature source Oral, resp. rate 12, height 5\' 8"  (1.727 m), weight 224 lb 13.9 oz (102 kg), SpO2 96 %.  Labs: Lab Results  Component Value Date   WBC 9.4 02/13/2017   HGB 12.6 (L) 02/13/2017   HCT 38.4 (L) 02/13/2017   MCV 87.3 02/13/2017   PLT 175 02/13/2017    Recent Labs Lab 02/11/17 1151   02/13/17 0305  NA 132*  < > 133*  K 5.4*  < > 4.9  CL 93*  < > 98*  CO2 25  < > 26  BUN 45*  < > 46*  CREATININE 1.84*  < > 1.73*  CALCIUM 9.6  < > 9.7  PROT 7.0  --   --   BILITOT 0.9  --   --   ALKPHOS 68  --   --   ALT 26  --   --   AST 27  --   --   GLUCOSE 437*  < > 153*  < > = values in this interval not displayed. Lab Results  Component Value Date   CHOL 189 12/23/2016   HDL 33 (L) 12/23/2016   LDLCALC 104 (H) 12/23/2016   TRIG 259 (H) 12/23/2016   BNP (last 3 results)  Recent Labs  11/14/16 1405 11/17/16 0210 01/16/17 1318  BNP 1,992.9* 2,032.0* 363.7*    ProBNP (last 3 results) No results for input(s): PROBNP in the last 8760 hours.   Diagnostic Studies/Procedures   Dg Chest 2 View  Result Date: 02/13/2017 CLINICAL DATA:  ICD placement EXAM: CHEST  2 VIEW COMPARISON:  02/11/2017 FINDINGS: Left single lead AICD noted in place with the tip in the right ventricle. No pneumothorax. Heart is normal size. Linear densities in the lung bases, likely atelectasis. No effusions or acute bony abnormality. IMPRESSION: Bibasilar atelectasis.  No pneumothorax. Electronically Signed  By: Rolm Baptise M.D.   On: 02/13/2017 10:34   Ct Head Wo Contrast  Result Date: 02/11/2017 CLINICAL DATA:  Posttraumatic headache after fall. No reported loss of consciousness. EXAM: CT HEAD WITHOUT CONTRAST TECHNIQUE: Contiguous axial images were obtained from the base of the skull through the vertex without intravenous contrast. COMPARISON:  None. FINDINGS: Brain: No evidence of acute infarction, hemorrhage, hydrocephalus, extra-axial collection or mass lesion/mass effect. Vascular: No hyperdense vessel or unexpected calcification. Skull: Normal. Negative for fracture or focal lesion. Sinuses/Orbits: No acute finding. Other: Small right posterior parietal scalp hematoma is noted. IMPRESSION: Small right posterior parietal scalp hematoma. No acute intracranial abnormality seen. Electronically  Signed   By: Marijo Conception, M.D.   On: 02/11/2017 13:37    Discharge Medications   Allergies as of 02/13/2017      Reactions   Plavix [clopidogrel]    Nose bleeds, swellings, whelps on legs & back, itching   Lipitor [atorvastatin] Other (See Comments)   REACTION: sore legs   Crestor [rosuvastatin Calcium] Other (See Comments)   Myalgias, Interfering with Gait   Entresto [sacubitril-valsartan]    Angioedema   Ancef [cefazolin] Rash   Describes itching and rash, but said 'it wasn't that bad'      Medication List    STOP taking these medications   glucose blood test strip Commonly known as:  ACCU-CHEK AVIVA     TAKE these medications   accu-chek soft touch lancets Check blood sugars 1-2 times per day. DX: E11.65   acetaminophen 500 MG tablet Commonly known as:  TYLENOL Take 500 mg by mouth daily as needed for moderate pain or headache.   amiodarone 200 MG tablet Commonly known as:  PACERONE 200 mg BID twice daily until 02/18/17, Then take 200 mg daily. What changed:  additional instructions   carvedilol 6.25 MG tablet Commonly known as:  COREG Take 1 tablet (6.25 mg total) by mouth 2 (two) times daily.   CENTRUM SILVER ADULT 50+ Tabs Take 1 tablet by mouth daily.   digoxin 0.125 MG tablet Commonly known as:  LANOXIN Take 0.5 tablets (0.0625 mg total) by mouth daily. What changed:  how much to take   diphenhydramine-acetaminophen 25-500 MG Tabs tablet Commonly known as:  TYLENOL PM Take 1 tablet by mouth at bedtime as needed (sleep).   furosemide 20 MG tablet Commonly known as:  LASIX Take 4 tablets (80 mg total) by mouth daily. What changed:  how much to take  when to take this  additional instructions   glimepiride 2 MG tablet Commonly known as:  AMARYL Take 1 tablet (2 mg total) by mouth daily before breakfast.   losartan 25 MG tablet Commonly known as:  COZAAR Take 1 tablet (25 mg total) by mouth 2 (two) times daily.   pravastatin 20 MG  tablet Commonly known as:  PRAVACHOL Take 1 tablet (20 mg total) by mouth every evening.   REPATHA SURECLICK [id number] MG/ML [place] Generic drug:  Evolocumab Inject 1 pen into the skin every 14 (fourteen) days.   Rivaroxaban 15 MG Tabs tablet Commonly known as:  XARELTO Take 1 tablet (15 mg total) by mouth daily. Start taking on:  02/14/2017   spironolactone 25 MG tablet Commonly known as:  ALDACTONE Take 0.5 tablets (12.5 mg total) by mouth daily. What changed:  how much to take   ticagrelor 60 MG Tabs tablet Commonly known as:  BRILINTA Take 1 tablet (60 mg total) by mouth 2 (two) times daily.  traMADol 50 MG tablet Commonly known as:  ULTRAM Take 1 tablet (50 mg total) by mouth every 6 (six) hours as needed for moderate pain (at device insertion site).       Disposition   The patient will be discharged in stable condition to home. Discharge Instructions    (HEART FAILURE PATIENTS) Call MD:  Anytime you have any of the following symptoms: 1) 3 pound weight gain in 24 hours or 5 pounds in 1 week 2) shortness of breath, with or without a dry hacking cough 3) swelling in the hands, feet or stomach 4) if you have to sleep on extra pillows at night in order to breathe.    Complete by:  As directed    Diet - low sodium heart healthy    Complete by:  As directed    Heart Failure patients record your daily weight using the same scale at the same time of day    Complete by:  As directed    Increase activity slowly    Complete by:  As directed      Follow-up Information    [place] [facility] Follow up on 02/23/2017.   Specialty:  Cardiology Why:  at [facility] for wound check  Contact information: [address], [place]       [name], MD Follow up on 05/18/2017.   Specialty:  Cardiology Why:  at 9:15AM Contact information: [place]. [place] [address] [phone]        Loralie Champagne, MD Follow up on 03/03/2017.   Specialty:  Cardiology Why:  at 300 pm for follow up.  Please bring all of your medicaitons to your visit. The code for parking is 5002. Contact information: [place] [address] [place] Follow up on 02/18/2017.   Specialty:  Cardiology Why:   For labs at [place]. OK to arrive anytime around 0900.   Code for parking is 5002. Contact information: [address] [id number] [place] [phone]            Duration of Discharge Encounter: Greater than 35 minutes   Signed, Annamaria Helling 02/13/2017, 11:35 AM

## 2017-02-16 ENCOUNTER — Ambulatory Visit (HOSPITAL_COMMUNITY): Payer: Medicare Other

## 2017-02-16 ENCOUNTER — Encounter (HOSPITAL_COMMUNITY): Admission: RE | Admit: 2017-02-16 | Payer: Medicare Other | Source: Ambulatory Visit

## 2017-02-16 ENCOUNTER — Other Ambulatory Visit (HOSPITAL_COMMUNITY): Payer: Medicare Other

## 2017-02-17 ENCOUNTER — Ambulatory Visit: Payer: Medicare Other | Admitting: Family Medicine

## 2017-02-17 ENCOUNTER — Telehealth: Payer: Self-pay | Admitting: *Deleted

## 2017-02-17 DIAGNOSIS — Z0289 Encounter for other administrative examinations: Secondary | ICD-10-CM

## 2017-02-17 NOTE — Telephone Encounter (Signed)
Left message on machine for patient.  No show 02/17/17. Called to see if patient would like to reschedule the appointment.

## 2017-02-18 ENCOUNTER — Other Ambulatory Visit (HOSPITAL_COMMUNITY): Payer: Self-pay | Admitting: *Deleted

## 2017-02-18 ENCOUNTER — Encounter (HOSPITAL_COMMUNITY): Payer: Medicare Other

## 2017-02-18 ENCOUNTER — Ambulatory Visit (HOSPITAL_COMMUNITY): Payer: Medicare Other

## 2017-02-18 ENCOUNTER — Ambulatory Visit (HOSPITAL_COMMUNITY)
Admit: 2017-02-18 | Discharge: 2017-02-18 | Disposition: A | Discharge status: Home / Self Care | Payer: Medicare Other | Source: Ambulatory Visit | Attending: Cardiology | Admitting: Cardiology

## 2017-02-18 DIAGNOSIS — I5022 Chronic systolic (congestive) heart failure: Secondary | ICD-10-CM | POA: Diagnosis not present

## 2017-02-18 LAB — BASIC METABOLIC PANEL
Anion gap: 9 (ref 5–15)
BUN: 33 mg/dL — ABNORMAL HIGH (ref 6–20)
CO2: 25 mmol/L (ref 22–32)
CREATININE: 1.54 mg/dL — AB (ref 0.61–1.24)
Calcium: 9.4 mg/dL (ref 8.9–10.3)
Chloride: 104 mmol/L (ref 101–111)
GFR calc non Af Amer: 43 mL/min — ABNORMAL LOW (ref >60)
GFR, EST AFRICAN AMERICAN: 50 mL/min — AB (ref >60)
Glucose, Bld: 240 mg/dL — ABNORMAL HIGH (ref 65–99)
POTASSIUM: 5 mmol/L (ref 3.5–5.1)
Sodium: 138 mmol/L (ref 135–145)

## 2017-02-18 LAB — MAGNESIUM: Magnesium: 2.1 mg/dL (ref 1.7–2.4)

## 2017-02-20 ENCOUNTER — Telehealth (HOSPITAL_COMMUNITY): Payer: Self-pay | Admitting: *Deleted

## 2017-02-20 ENCOUNTER — Encounter (HOSPITAL_COMMUNITY): Payer: Medicare Other

## 2017-02-20 ENCOUNTER — Ambulatory Visit (HOSPITAL_COMMUNITY): Payer: Medicare Other

## 2017-02-20 NOTE — Telephone Encounter (Signed)
Patient had a ICD placed last week and called saying he is still very sore all across his chest, especially at night when he lays on his side.  He wanting to know if this was normal to still be this sore.  Spoke with Dr. Aundra Dubin and between the CPR and implanting the devise he feels that he will be sore for awhile.  I called patient back but had to leave VM.  I explained that after CPR it is normal to be very sore for awhile.  I advised him to call us back if the soreness doesn't start to resolve over the next week.

## 2017-02-21 ENCOUNTER — Other Ambulatory Visit: Payer: Self-pay | Admitting: Family Medicine

## 2017-02-23 ENCOUNTER — Other Ambulatory Visit (HOSPITAL_COMMUNITY): Payer: Self-pay | Admitting: Cardiology

## 2017-02-23 ENCOUNTER — Ambulatory Visit (INDEPENDENT_AMBULATORY_CARE_PROVIDER_SITE_OTHER): Payer: Medicare Other | Admitting: *Deleted

## 2017-02-23 ENCOUNTER — Ambulatory Visit (HOSPITAL_COMMUNITY)
Admission: RE | Admit: 2017-02-23 | Discharge: 2017-02-23 | Disposition: A | Discharge status: Home / Self Care | Payer: Medicare Other | Source: Ambulatory Visit | Attending: Internal Medicine | Admitting: Internal Medicine

## 2017-02-23 ENCOUNTER — Ambulatory Visit (HOSPITAL_COMMUNITY): Payer: Medicare Other

## 2017-02-23 ENCOUNTER — Encounter (HOSPITAL_COMMUNITY): Payer: Medicare Other

## 2017-02-23 DIAGNOSIS — I255 Ischemic cardiomyopathy: Secondary | ICD-10-CM | POA: Diagnosis not present

## 2017-02-23 DIAGNOSIS — T82190A Other mechanical complication of cardiac electrode, initial encounter: Secondary | ICD-10-CM

## 2017-02-23 DIAGNOSIS — Z95810 Presence of automatic (implantable) cardiac defibrillator: Secondary | ICD-10-CM | POA: Insufficient documentation

## 2017-02-23 DIAGNOSIS — Y838 Other surgical procedures as the cause of abnormal reaction of the patient, or of later complication, without mention of misadventure at the time of the procedure: Secondary | ICD-10-CM | POA: Diagnosis not present

## 2017-02-23 DIAGNOSIS — I2510 Atherosclerotic heart disease of native coronary artery without angina pectoris: Secondary | ICD-10-CM | POA: Diagnosis not present

## 2017-02-23 DIAGNOSIS — J948 Other specified pleural conditions: Secondary | ICD-10-CM | POA: Diagnosis not present

## 2017-02-23 DIAGNOSIS — Z955 Presence of coronary angioplasty implant and graft: Secondary | ICD-10-CM | POA: Insufficient documentation

## 2017-02-23 LAB — CUP PACEART INCLINIC DEVICE CHECK
Battery Voltage: 3.13 V
Brady Statistic RV Percent Paced: 0.01 %
Date Time Interrogation Session: 20180312094808
HIGH POWER IMPEDANCE MEASURED VALUE: 63 Ohm
Lead Channel Impedance Value: 266 Ohm
Lead Channel Sensing Intrinsic Amplitude: 1.5 mV
Lead Channel Setting Pacing Amplitude: 4.25 V
Lead Channel Setting Pacing Pulse Width: 0.4 ms
MDC IDC LEAD IMPLANT DT: 20180301
MDC IDC LEAD LOCATION: 753860
MDC IDC MSMT BATTERY REMAINING LONGEVITY: 137 mo
MDC IDC MSMT LEADCHNL RV IMPEDANCE VALUE: 323 Ohm
MDC IDC MSMT LEADCHNL RV PACING THRESHOLD AMPLITUDE: 0.75 V
MDC IDC MSMT LEADCHNL RV PACING THRESHOLD PULSEWIDTH: 0.4 ms
MDC IDC PG IMPLANT DT: 20180301
MDC IDC SET LEADCHNL RV SENSING SENSITIVITY: 0.3 mV

## 2017-02-23 MED ORDER — LOSARTAN POTASSIUM 25 MG PO TABS: 25.0000 mg | ORAL_TABLET | Freq: Two times a day (BID) | ORAL | 6 refills | Status: DC

## 2017-02-23 NOTE — Progress Notes (Signed)
Wound check appointment. Steri-strips removed. Wound without redness or edema. Incision edges approximated, wound well healed. Normal device function. Threshold and impedances consistent with implant measurements. R waves decreased from implant, trend shows continued decline from about 5-51mV. Reviewed with JA (GT not in office), 2V CXR ordered for lead evaluation (pt to get CXR after appt today).  Device programmed with autocapture on. Histogram distribution appropriate for patient and level of activity. No AT/AF or ventricular arrhythmias noted. Patient educated about wound care, arm mobility, lifting restrictions, shock plan. Pt to call back with monitor serial number for [facility] assignment. ROV with GT 05/18/17.

## 2017-02-25 ENCOUNTER — Ambulatory Visit (HOSPITAL_COMMUNITY): Payer: Medicare Other

## 2017-02-25 ENCOUNTER — Encounter (HOSPITAL_COMMUNITY): Admission: RE | Admit: 2017-02-25 | Payer: Medicare Other | Source: Ambulatory Visit

## 2017-02-27 ENCOUNTER — Ambulatory Visit (HOSPITAL_COMMUNITY): Payer: Medicare Other

## 2017-02-27 ENCOUNTER — Encounter (HOSPITAL_COMMUNITY): Admission: RE | Admit: 2017-02-27 | Payer: Medicare Other | Source: Ambulatory Visit

## 2017-03-02 ENCOUNTER — Ambulatory Visit (HOSPITAL_COMMUNITY): Payer: Medicare Other

## 2017-03-02 ENCOUNTER — Encounter (HOSPITAL_COMMUNITY): Payer: Medicare Other

## 2017-03-03 ENCOUNTER — Encounter (HOSPITAL_COMMUNITY): Payer: Self-pay

## 2017-03-03 ENCOUNTER — Ambulatory Visit (HOSPITAL_COMMUNITY)
Admission: RE | Admit: 2017-03-03 | Discharge: 2017-03-03 | Disposition: A | Discharge status: Home / Self Care | Payer: Medicare Other | Source: Ambulatory Visit | Attending: Cardiology | Admitting: Cardiology

## 2017-03-03 ENCOUNTER — Ambulatory Visit (HOSPITAL_BASED_OUTPATIENT_CLINIC_OR_DEPARTMENT_OTHER)
Admission: RE | Admit: 2017-03-03 | Discharge: 2017-03-03 | Disposition: A | Discharge status: Home / Self Care | Payer: Medicare Other | Source: Ambulatory Visit | Attending: Cardiology | Admitting: Cardiology

## 2017-03-03 VITALS — BP 136/79 | HR 76 | Wt 228.2 lb

## 2017-03-03 DIAGNOSIS — E669 Obesity, unspecified: Secondary | ICD-10-CM | POA: Insufficient documentation

## 2017-03-03 DIAGNOSIS — Z7982 Long term (current) use of aspirin: Secondary | ICD-10-CM | POA: Insufficient documentation

## 2017-03-03 DIAGNOSIS — Z95810 Presence of automatic (implantable) cardiac defibrillator: Secondary | ICD-10-CM | POA: Diagnosis not present

## 2017-03-03 DIAGNOSIS — Z811 Family history of alcohol abuse and dependence: Secondary | ICD-10-CM | POA: Diagnosis not present

## 2017-03-03 DIAGNOSIS — Z955 Presence of coronary angioplasty implant and graft: Secondary | ICD-10-CM | POA: Insufficient documentation

## 2017-03-03 DIAGNOSIS — Z8249 Family history of ischemic heart disease and other diseases of the circulatory system: Secondary | ICD-10-CM | POA: Diagnosis not present

## 2017-03-03 DIAGNOSIS — I5022 Chronic systolic (congestive) heart failure: Secondary | ICD-10-CM | POA: Insufficient documentation

## 2017-03-03 DIAGNOSIS — I350 Nonrheumatic aortic (valve) stenosis: Secondary | ICD-10-CM | POA: Diagnosis not present

## 2017-03-03 DIAGNOSIS — Z7984 Long term (current) use of oral hypoglycemic drugs: Secondary | ICD-10-CM | POA: Insufficient documentation

## 2017-03-03 DIAGNOSIS — Z79899 Other long term (current) drug therapy: Secondary | ICD-10-CM | POA: Diagnosis not present

## 2017-03-03 DIAGNOSIS — I739 Peripheral vascular disease, unspecified: Secondary | ICD-10-CM | POA: Insufficient documentation

## 2017-03-03 DIAGNOSIS — Z87891 Personal history of nicotine dependence: Secondary | ICD-10-CM | POA: Diagnosis not present

## 2017-03-03 DIAGNOSIS — I480 Paroxysmal atrial fibrillation: Secondary | ICD-10-CM | POA: Insufficient documentation

## 2017-03-03 DIAGNOSIS — I110 Hypertensive heart disease with heart failure: Secondary | ICD-10-CM | POA: Diagnosis not present

## 2017-03-03 DIAGNOSIS — Z7902 Long term (current) use of antithrombotics/antiplatelets: Secondary | ICD-10-CM | POA: Diagnosis not present

## 2017-03-03 DIAGNOSIS — I2510 Atherosclerotic heart disease of native coronary artery without angina pectoris: Secondary | ICD-10-CM | POA: Insufficient documentation

## 2017-03-03 DIAGNOSIS — E785 Hyperlipidemia, unspecified: Secondary | ICD-10-CM | POA: Insufficient documentation

## 2017-03-03 DIAGNOSIS — G4733 Obstructive sleep apnea (adult) (pediatric): Secondary | ICD-10-CM | POA: Insufficient documentation

## 2017-03-03 DIAGNOSIS — M353 Polymyalgia rheumatica: Secondary | ICD-10-CM | POA: Diagnosis not present

## 2017-03-03 DIAGNOSIS — M1990 Unspecified osteoarthritis, unspecified site: Secondary | ICD-10-CM | POA: Diagnosis not present

## 2017-03-03 DIAGNOSIS — I255 Ischemic cardiomyopathy: Secondary | ICD-10-CM | POA: Insufficient documentation

## 2017-03-03 DIAGNOSIS — E119 Type 2 diabetes mellitus without complications: Secondary | ICD-10-CM | POA: Diagnosis not present

## 2017-03-03 DIAGNOSIS — Z6834 Body mass index (BMI) 34.0-34.9, adult: Secondary | ICD-10-CM | POA: Diagnosis not present

## 2017-03-03 DIAGNOSIS — I5023 Acute on chronic systolic (congestive) heart failure: Secondary | ICD-10-CM | POA: Diagnosis not present

## 2017-03-03 DIAGNOSIS — I4891 Unspecified atrial fibrillation: Secondary | ICD-10-CM

## 2017-03-03 DIAGNOSIS — Z8546 Personal history of malignant neoplasm of prostate: Secondary | ICD-10-CM | POA: Insufficient documentation

## 2017-03-03 LAB — ECHOCARDIOGRAM COMPLETE
AVLVOTPG: 2 mmHg
CHL CUP DOP CALC LVOT VTI: 15.3 cm
CHL CUP MV DEC (S): 296
E/e' ratio: 19.03
EWDT: 296 ms
FS: 12 % — AB (ref 28–44)
IV/PV OW: 1.18
LA diam index: 2.37 cm/m2
LA vol A4C: 66.2 ml
LA vol index: 30.1 mL/m2
LA vol: 64.8 mL
LASIZE: 51 mm
LEFT ATRIUM END SYS DIAM: 51 mm
LV E/e' medial: 19.03
LV PW d: 8.12 mm — AB (ref 0.6–1.1)
LV TDI E'MEDIAL: 4.68
LV dias vol: 256 mL — AB (ref 62–150)
LV sys vol: 168 mL — AB (ref 21–61)
LVDIAVOLIN: 119 mL/m2
LVEEAVG: 19.03
LVELAT: 3.7 cm/s
LVOT area: 4.52 cm2
LVOT diameter: 24 mm
LVOTPV: 68.1 cm/s
LVOTSV: 69 mL
LVSYSVOLIN: 78 mL/m2
Lateral S' vel: 12.8 cm/s
MV pk A vel: 142 m/s
MVPKEVEL: 70.4 m/s
RV TAPSE: 23.1 mm
Simpson's disk: 34
Stroke v: 88 ml
TDI e' lateral: 3.7

## 2017-03-03 LAB — BASIC METABOLIC PANEL
ANION GAP: 11 (ref 5–15)
BUN: 28 mg/dL — ABNORMAL HIGH (ref 6–20)
CHLORIDE: 101 mmol/L (ref 101–111)
CO2: 25 mmol/L (ref 22–32)
Calcium: 9.6 mg/dL (ref 8.9–10.3)
Creatinine, Ser: 1.46 mg/dL — ABNORMAL HIGH (ref 0.61–1.24)
GFR, EST AFRICAN AMERICAN: 53 mL/min — AB (ref >60)
GFR, EST NON AFRICAN AMERICAN: 46 mL/min — AB (ref >60)
GLUCOSE: 268 mg/dL — AB (ref 65–99)
Potassium: 4.6 mmol/L (ref 3.5–5.1)
Sodium: 137 mmol/L (ref 135–145)

## 2017-03-03 LAB — DIGOXIN LEVEL: Digoxin Level: 0.3 ng/mL — ABNORMAL LOW (ref 0.8–2.0)

## 2017-03-03 MED ORDER — CARVEDILOL 6.25 MG PO TABS: 9.3750 mg | ORAL_TABLET | Freq: Two times a day (BID) | ORAL | 3 refills | Status: DC

## 2017-03-03 NOTE — Patient Instructions (Signed)
Labs today (will call for abnormal results, otherwise no news is good news)  INCREASE Carvedilol to 9.375 mg (1.5 Tablets) Two Times Daily  You may restart Cardiac Rehab, they will contact you for your next appointment.  Follow up in 1 Month

## 2017-03-03 NOTE — Progress Notes (Signed)
  Echocardiogram 2D Echocardiogram has been performed.  Darlina Sicilian M 03/03/2017, 2:28 PM

## 2017-03-04 ENCOUNTER — Encounter (HOSPITAL_COMMUNITY): Payer: Medicare Other

## 2017-03-04 ENCOUNTER — Telehealth: Payer: Self-pay | Admitting: *Deleted

## 2017-03-04 ENCOUNTER — Ambulatory Visit (HOSPITAL_COMMUNITY): Payer: Medicare Other

## 2017-03-04 ENCOUNTER — Telehealth (HOSPITAL_COMMUNITY): Payer: Self-pay | Admitting: *Deleted

## 2017-03-04 NOTE — Telephone Encounter (Signed)
Patient called in asking about lab results from [place] appointment.  Results given, no further questions at this time.

## 2017-03-04 NOTE — Telephone Encounter (Signed)
LMOM to call back with Medtronic monitor Serial Number.

## 2017-03-04 NOTE — Progress Notes (Signed)
Patient ID: Wayne [name], male   DOB: January 29, 1941, 76 y.o.   MRN: [id number]   Advanced Heart Failure Clinic Note   Patient ID: Wayne [name], male   DOB: August 13, 1941, 76 y.o.   MRN: [id number] PCP: Dr. Elease Hashimoto Cardiology: Dr. Aundra Dubin  76 yo with history of DM, HTN, CAD, and ischemic cardiomyopathy presents for cardiology followup. Patient had a CHF exacerbation in 2/12 and was found to have LV systolic dysfunction with EF around 20%. LHC showed RCA, ramus, and CFX disease. RCA was subtotally occluded. Cardiac MRI showed that all walls, including the inferior wall, should be viable. Patient therefore underwent opening of his chronic totally occluded RCA as well as PCI to the ramus in 2/12. He received drug eluting stents and was on Effient.  He had an echo in 2/13 that showed EF improved to 45% with moderate LV dilation and mild LV hypertrophy.   In 5/13, he developed a large left lower leg hematoma.  He was still on Effient at that time.  ASA and Effient were stopped.  The hematoma did not resolve.  In 7/13, he was re-admitted with septic shock from MSSA from an abscess in his left gastrocnemius.  He also grew Pseudomonas from the left gastrocnemius as well.  He had a prolonged course in the hospital and later in a rehab facility.  Ultimately, he got back home again.  I had him get an echo in 1/14, and this showed EF 15% with diffuse hypokinesis and inferoposterior akinesis.  He had been off of most of his prior cardiac medications.  I took him back for [facility] in 1/14.  This showed subtotal occlusion of a small AV LCx and 95% ostial in-stent restenosis in the RCA.  He was treated in 2/14 with a Xience DES to the ostial RCA and begun on Plavix.  Unfortunately, he developed diffuse hives after starting Plavix and had to be switched to ticlopidine.  He has tolerated ticlopidine.   Echo done in 12/14 showed some improvement in LV function but EF was still low at 30-35%.  He did not want ICD.    Presented to ED  11/14/15 with worsening SOB after a few steps and CXR with CHF and bilateral effusions in the setting of marked medical non-compliance. States he stopped taking his medicines in 01/2015 (except for ASA 81 and a multivitamin).  He was feeling good and decided that he did not need them anymore.  Also c/o URI symptoms. Diuresed over 2 L with IV diuretics in the ER and started on lasix 20 mg daily.   Echo (12/16) showed EF 20-25% with diffuse hypokinesis (down from 35% in 12/15).  Echo 10/17 showed EF 15% with mildly decreased RV systolic function.    He had left and right heart catheterization in 12/17. This showed elevated right and left heart filling pressures and low cardiac output. The RCA was totally occluded with collaterals, there was 95% ostial to mid LAD stenosis.  Patient had PCI with DES to proximal-mid LAD.    He has had some difficulty with medication compliance.  However, he has is now in the [facility] trial medical arm and visits with the research nurse have really helped his medication compliance. He is currently taking meds as ordered.   He was noted to be in atrial fibrillation with RVR in 2/18 at cardiac rehab. He felt fatigued.  He was admitted and started on amiodarone for rate control. ASA was stopped, ticagrelor was decreased to 60 mg  bid, and Xarelto 15 mg daily was started.  He converted spontaneously back to NSR.    He suffered a ventricular fibrillation arrest in 3/18.  Luckily, he was wearing a Lifevest and was shocked.  He was admitted and started on amiodarone.  Medtronic ICD was placed.  Echo 3/18 with EF 20-25%.   Today, weight is down 4 lbs.  He gets "dizzy" after taking the 2nd doses of his meds, but he tends to take them around 3 pm.  Not using CPAP.  No dyspnea walking on flat ground.  He is short of breath with steps.  No chest pain.  No orthopnea/PND.   Labs (12/16): K 4.9, creatinine 1.23 Labs (1/17): K 4.7, creatinine 1.15, BNP 1433 Labs (2/17): K 4.7, creatinine  1.14, BNP 870 Labs (3/17): K 4.5, creatinine 1.10, BNP 1257 Labs (5/17): K 4.2, creatinine 1.2, BNP 818 Labs (7/17): LDL 149, HDL 40, TGs 210 Labs (9/17): K 4.9, creatinine 1.36 Labs (10/17): K 3.7, creatinine 1.2, LDL 87, HDL 32, LFTs normal Labs (12/17): K 4.5, creatinine 1.34, digoxin 0.3 Labs (2/18): K 4.4, creatinine 1.48, hgb 11.8 Labs (3/18): K 5, creatinine 1.54, digoxin 1.3  ECG: NSR, IVCD 128 msec  Past Medical History:  1. HYPERTENSION  2. HYPERLIPIDEMIA: Myalgias with atorvastatin and Crestor 3. ECZEMA  4. RHINITIS  5. HERPES ZOSTER OPHTHALMICUS  6. ADENOCARCINOMA, PROSTATE: Status post prostatectomy in 2009. Has had some incontinence since then.  7. Diabetes mellitus type II  8. Arthritis  9. Obesity  10. GERD: rare  11. CAD: Presented with exertional dyspnea, never had chest pain. LHC (2/12) with subtotalled proximal RCA and left to right collaterals, 90% proximal moderate-sized ramus, 95% proximal relatively small CFX, 40-50% proximal LAD. Cardiac MRI (2/12) showed EF 21%, some mild scar in basal segments but all wall segments would be expected to be viable. DES x 5 (overlapping) to RCA, DES x 1 to RI 01/30/11 .  Bled into leg with Effient use (long, complicated course).  LHC (1/14): AV LCx small with subtotal occlusion, 95% ostial instent restenosis in RCA. PCI in 2/14 to ostial RCA with 3.5 x 15 Xience DES.  Plavix allergy (hives) so put on ticlopidine.  - LHC (12/17):  the RCA was totally occluded with collaterals, there was 95% ostial to mid LAD stenosis, patient had PCI with DES to proximal-mid LAD. 12. Ischemic CMP: Echo (2/12) with moderately dilated LV, EF about 20% with diffuse hypokinesis and inferior akinesis, pseudonormal diastolic function, mild MR, severe LAE, mildly decreased RV systolic function. RHC (2/12) with mean RA 12, PA 40/25, mean PCWP 26, CI 2.1.  Echo (5/12) with EF 40% (appeared worse to my eye) with posterior HK, basal inferior AK, inferoseptal AK,  basal anteroseptal AK, mild MR.  Cardiac MRI was repeated and showed EF 32% (improved from 21%) and mild LV dilation (was severely dilated before) with diffuse hypokinesis and subendocardial scar in the basal inferior, basal posterior, and basal anterolateral segments.  Echo (2/13) with EF 45%, moderate LV dilation, mild LVH.  Echo (1/14) with EF 15%, diffuse hypokinesis, inferoposterior akinesis, mild MR, grade I diastolic dysfunction.  Echo (5/14) with EF 30-35%, mild LV dilation, akinesis of the basal inferior wall otherwise diffuse hypokinesis.  Echo (12/14) with EF 30-35%, mild LV dilation, diffuse hypokinesis with basal inferior and posterior akinesis. Echo (12/15) with EF 35%, mildly dilated LV, wall motion abnormalities, normal RV size and systolic function. Echo (12/16) with EF 20-25%, diffuse hypokinesis, severe LV dilation, mild MR.  -  Echo (10/17): EF 15%, grade II diastolic dysfunction, mild MR, mildly decreased RV systolic function.  - Possible angioedema related to Entresto.  - Hyperkalemia with spironolactone 12.5 daily.  - CPX (10/17): peak VO2 10.6, VE/VCO2 slope 48.6, RER 1.12.  Severe HF limitation.  - RHC (12/17): mean RA 14, PA 53/27, mean PCWP 25, CI 1.93.  - Echo (3/18): EF 20-25%, moderate LV dilation, mild LVH, normal RV size and systolic function, mild aortic stenosis.  - Medtronic ICD 3/18.  13. Cervical OA.  14. Polymyalgia rheumatica 15. OSA: Severe on sleep study 10/12.  On CPAP.  16. Left lower leg hematoma in setting of Effient use.  He developed a left gastrocnemius abscess and septic shock with prolonged hospitalization beginning in 7/13.  17. PAD: Lower extremity arterial doppler evaluation in 2/17 showed occluded peroneal arteries bilaterally, ABI 1.1 (R) and 1.0 (L).   18. Carotid dopplers (10/17) with minimal disease.  19. Atrial fibrillation: Paroxysmal.  20. Ventricular fibrillation arrest: 3/18.  Medtronic ICD placed, now on amiodarone.  21. Aortic stenosis:  Mild on 3/18 echo.   Family History:  Father died with MI at age 49. He was an alcoholic. Mother died with cancer at around 27.   Social History:  Occupation: retired Administrator  Married, lives in [place]  Past smoker, quit around [place]: all systems reviewed and negative except as per HPI.    Current Outpatient Prescriptions  Medication Sig Dispense Refill  . acetaminophen (TYLENOL) 500 MG tablet Take 500 mg by mouth daily as needed for moderate pain or headache.    Marland Kitchen amiodarone (PACERONE) 200 MG tablet 200 mg BID twice daily until 02/18/17, Then take 200 mg daily. 40 tablet 6  . carvedilol (COREG) 6.25 MG tablet Take 1.5 tablets (9.375 mg total) by mouth 2 (two) times daily. 90 tablet 3  . digoxin (LANOXIN) 0.125 MG tablet Take 0.5 tablets (0.0625 mg total) by mouth daily. 15 tablet 6  . diphenhydramine-acetaminophen (TYLENOL PM) 25-500 MG TABS tablet Take 1 tablet by mouth at bedtime as needed (sleep).    . Evolocumab (REPATHA SURECLICK) 696 MG/ML SOAJ Inject 1 pen into the skin every 14 (fourteen) days.    . furosemide (LASIX) 20 MG tablet Take 4 tablets (80 mg total) by mouth daily. 120 tablet 6  . glimepiride (AMARYL) 2 MG tablet TAKE 1 TABLET(2 MG) BY MOUTH DAILY BEFORE BREAKFAST 90 tablet 1  . Lancets (ACCU-CHEK SOFT TOUCH) lancets Check blood sugars 1-2 times per day. DX: E11.65 200 each 5  . losartan (COZAAR) 25 MG tablet Take 1 tablet (25 mg total) by mouth 2 (two) times daily. 60 tablet 6  . Multiple Vitamins-Minerals (CENTRUM SILVER ADULT 50+) TABS Take 1 tablet by mouth daily.    . pravastatin (PRAVACHOL) 20 MG tablet Take 1 tablet (20 mg total) by mouth every evening. 30 tablet 2  . Rivaroxaban (XARELTO) 15 MG TABS tablet Take 1 tablet (15 mg total) by mouth daily. 30 tablet 3  . spironolactone (ALDACTONE) 25 MG tablet Take 0.5 tablets (12.5 mg total) by mouth daily. 15 tablet 6  . ticagrelor (BRILINTA) 60 MG TABS tablet Take 1 tablet (60 mg total) by mouth 2 (two)  times daily. 60 tablet 6  . traMADol (ULTRAM) 50 MG tablet Take 1 tablet (50 mg total) by mouth every 6 (six) hours as needed for moderate pain (at device insertion site). 30 tablet 0   No current facility-administered medications for this encounter.  BP 136/79   Pulse 76   Wt 228 lb 4 oz (103.5 kg)   SpO2 98%   BMI 34.71 kg/m    Wt Readings from Last 3 Encounters:  03/03/17 228 lb 4 oz (103.5 kg)  02/13/17 224 lb 13.9 oz (102 kg)  01/23/17 232 lb 1.9 oz (105.3 kg)    General: NAD, obese.  Neck: Thick, JVP not elevated, no thyromegaly or thyroid nodule. No carotid bruit.  HEENT: Normal.  Lungs: CTAB CV: Nondisplaced PMI. Heart regular S1/S2, no S3/S4, no murmur. Difficult to palpate pedal pulses.  Abdomen: Soft, NT, mild/moderate distention, no HSM. No bruits or masses. +BS  Neurologic: Alert and oriented x 3.  Psych: Pleasant affect.  Extremities: No clubbing or cyanosis. No edema.  Assessment/Plan:  1. Chronic systolic CHF: Ischemic cardiomyopathy.  3/18 echo with EF 20-25%, diffuse hypokinesis.  CPX in 10/17 with severe HF limitation.  RHC in 12/17 with elevated filling pressures and low cardiac output.  He is now s/p PCI to proximal to mid LAD with much improved symptoms.  Medtronic ICD in 3/18 after vfib arrest.  Now NYHA class II with no volume overload on exam.  - Continue Lasix 80 mg daily, BMET today.  - Continue spironolactone 25 daily.   - Continue losartan 25 mg bid. Of note, patient had possible angioedema related to [facility].   - Increase Coreg to 9.375 mg bid.   - Continue digoxin, check level today.    - Would take his pm meds later in the evening, rather than in the mid-afternoon.  This will likely decrease BP fluctuation (gets dizzy at times after afternoon meds).   - He may be an LVAD candidate in the future.   - Restart cardiac rehab.  2. CAD:  Now s/p PCI to proximal LAD.  He had an occluded RCA but there were left to right collaterals.  Much improved  symptomatically.  - Restart pravastatin 20 mg daily (not causing hip pain) => sounds like he has been approved for Repatha, needs to start.  - Anticoagulation for atrial fibrillation with antiplatelet agents is complicated with Mr Aden. He is allergic to Plavix, had spontaneous gastrocnemius hemorrhage with prasugrel.  He had been on ASA 81 + ticagrelor, had LAD stent in 12/17. We will not be able to transition him to Plavix while on anticoagulation given allergy. I am concerned with use of full dose ticagrelor with anticoagulation, especially with his prior spontaneous calf bleed. Based on [facility] data, will use Xarelto 15 mg daily. I decreased his ticagrelor dose to 60 mg bid and finish out at least 6 months of ticagrelor.  3. HLD: Has had myalgias with Crestor and atorvastatin.  He is now on Repatha.    4. PAD: Occluded peroneal arteries bilaterally.  No significant claudication.  5. Diabetes type II: Unable to get empagliflozin. 6. Atrial fibrillation:  He is in NSR today.  - As above, will be on combination of ticagrelor 60 mg bid and Xarelto 15 daily.  CBC today.  - Continue amiodarone 200 mg daily.  Check LFTs, TSH next appointment.  Will need regular eye exams while on amiodarone.  - Would consider atrial fibrillation ablation based on [facility] data.  7. Ventricular fibrillation arrest: On amiodarone and has Medtronic ICD.  No driving until 0/98.  8. OSA: Not using CPAP.   Followup in 1 month.    Loralie Champagne  03/04/2017

## 2017-03-05 ENCOUNTER — Telehealth: Payer: Self-pay | Admitting: Pharmacist

## 2017-03-05 DIAGNOSIS — E7800 Pure hypercholesterolemia, unspecified: Secondary | ICD-10-CM

## 2017-03-05 NOTE — Telephone Encounter (Signed)
Spoke to patient to schedule labs after starting Repatha. He reports he is doing well, but has lost his privileges to drive and would be unable to come for an appt for lipid panel. He asked if could be drawn at next visit with Dr. Aundra [name]. Will enter orders and add note to visit to draw lipid panel. Patient stated understanding and appreciation for help.

## 2017-03-06 ENCOUNTER — Encounter (HOSPITAL_COMMUNITY): Payer: Medicare Other

## 2017-03-06 ENCOUNTER — Ambulatory Visit (HOSPITAL_COMMUNITY): Payer: Medicare Other

## 2017-03-06 ENCOUNTER — Telehealth (HOSPITAL_COMMUNITY): Payer: Self-pay | Admitting: Family Medicine

## 2017-03-06 NOTE — Telephone Encounter (Signed)
Transmission received. Monitor set up complete.

## 2017-03-09 ENCOUNTER — Ambulatory Visit (HOSPITAL_COMMUNITY): Payer: Medicare Other

## 2017-03-09 ENCOUNTER — Encounter (HOSPITAL_COMMUNITY): Payer: Medicare Other

## 2017-03-09 ENCOUNTER — Other Ambulatory Visit: Payer: Self-pay | Admitting: Internal Medicine

## 2017-03-09 ENCOUNTER — Encounter (HOSPITAL_COMMUNITY)
Admission: RE | Admit: 2017-03-09 | Discharge: 2017-03-09 | Disposition: A | Discharge status: Home / Self Care | Payer: Medicare Other | Source: Ambulatory Visit | Attending: Cardiology | Admitting: Cardiology

## 2017-03-09 ENCOUNTER — Encounter (HOSPITAL_COMMUNITY): Payer: Self-pay

## 2017-03-09 DIAGNOSIS — Z87891 Personal history of nicotine dependence: Secondary | ICD-10-CM | POA: Diagnosis not present

## 2017-03-09 DIAGNOSIS — I255 Ischemic cardiomyopathy: Secondary | ICD-10-CM | POA: Insufficient documentation

## 2017-03-09 DIAGNOSIS — Z6834 Body mass index (BMI) 34.0-34.9, adult: Secondary | ICD-10-CM | POA: Insufficient documentation

## 2017-03-09 DIAGNOSIS — Z955 Presence of coronary angioplasty implant and graft: Secondary | ICD-10-CM | POA: Insufficient documentation

## 2017-03-09 DIAGNOSIS — Z7984 Long term (current) use of oral hypoglycemic drugs: Secondary | ICD-10-CM | POA: Diagnosis not present

## 2017-03-09 DIAGNOSIS — G4733 Obstructive sleep apnea (adult) (pediatric): Secondary | ICD-10-CM | POA: Insufficient documentation

## 2017-03-09 DIAGNOSIS — E785 Hyperlipidemia, unspecified: Secondary | ICD-10-CM | POA: Diagnosis not present

## 2017-03-09 DIAGNOSIS — K219 Gastro-esophageal reflux disease without esophagitis: Secondary | ICD-10-CM | POA: Diagnosis not present

## 2017-03-09 DIAGNOSIS — Z8546 Personal history of malignant neoplasm of prostate: Secondary | ICD-10-CM | POA: Insufficient documentation

## 2017-03-09 DIAGNOSIS — I5042 Chronic combined systolic (congestive) and diastolic (congestive) heart failure: Secondary | ICD-10-CM | POA: Diagnosis not present

## 2017-03-09 DIAGNOSIS — M353 Polymyalgia rheumatica: Secondary | ICD-10-CM | POA: Diagnosis not present

## 2017-03-09 DIAGNOSIS — M1990 Unspecified osteoarthritis, unspecified site: Secondary | ICD-10-CM | POA: Diagnosis not present

## 2017-03-09 DIAGNOSIS — Z7982 Long term (current) use of aspirin: Secondary | ICD-10-CM | POA: Insufficient documentation

## 2017-03-09 DIAGNOSIS — I2510 Atherosclerotic heart disease of native coronary artery without angina pectoris: Secondary | ICD-10-CM | POA: Diagnosis not present

## 2017-03-09 DIAGNOSIS — Z79899 Other long term (current) drug therapy: Secondary | ICD-10-CM | POA: Diagnosis not present

## 2017-03-09 DIAGNOSIS — E669 Obesity, unspecified: Secondary | ICD-10-CM | POA: Diagnosis not present

## 2017-03-09 DIAGNOSIS — I110 Hypertensive heart disease with heart failure: Secondary | ICD-10-CM | POA: Diagnosis not present

## 2017-03-09 DIAGNOSIS — E119 Type 2 diabetes mellitus without complications: Secondary | ICD-10-CM | POA: Insufficient documentation

## 2017-03-09 NOTE — Progress Notes (Addendum)
Daily Session Note  Patient Details  Name: Wayne [name] MRN: [id number] Date of Birth: 10-Nov-1941 Referring Provider:     [place] from 12/16/2016 in [place]  Referring Provider  Loralie Champagne, MD      Encounter Date: 03/09/2017  Check In:     Session Check In - 03/09/17 0845      Check-In   Location [facility]   Staff Present Cleda Mccreedy, MS, Exercise Physiologist;Amber Fair, MS, ACSM RCEP, Exercise Physiologist;Joann Rion, RN, Marga Melnick, RN, BSN   Supervising physician immediately available to respond to emergencies [facility] immediately available   Physician(s) Dr. Tana Coast   Medication changes reported     No   Fall or balance concerns reported    No   Tobacco Cessation No Change   Warm-up and Cool-down Performed as group-led instruction   Resistance Training Performed Yes   VAD Patient? No     Pain Assessment   Currently in Pain? No/denies   Multiple Pain Sites No      Capillary Blood Glucose: No results found for this or any previous visit (from the past 24 hour(s)).    History  Smoking Status  . Former Smoker  . Packs/day: 0.30  . Years: 20.00  . Types: Cigarettes  . Quit date: 02/23/1989  Smokeless Tobacco  . Never Used    Goals Met:  Exercise tolerated well  Goals Unmet:  Not Applicable  Comments: pt returned to cardiac rehab after recent hospitalization.  Pt tolerated exercise well.  Med list reconciled. Pt is not taking evolocumab by choice.    Dr. Fransico Him is Medical Director for Cardiac Rehab at [facility]

## 2017-03-10 ENCOUNTER — Ambulatory Visit: Payer: Medicare Other | Admitting: Family Medicine

## 2017-03-10 NOTE — Progress Notes (Signed)
Cardiac Individual Treatment Plan  Patient Details  Name: Wayne [name] MRN: [id number] Date of Birth: 11-Sep-1941 Referring Provider:     [place] from 12/16/2016 in [place]  Referring Provider  Loralie Champagne, MD      Initial Encounter Date:    CARDIAC REHAB PHASE II ORIENTATION from 12/16/2016 in [place]  Date  12/16/16  Referring Provider  Loralie Champagne, MD      Visit Diagnosis: 11/17/16 S/P coronary artery stent placement  Patient's Home Medications on Admission:  Current Outpatient Prescriptions:  .  acetaminophen (TYLENOL) 500 MG tablet, Take 500 mg by mouth daily as needed for moderate pain or headache., Disp: , Rfl:  .  amiodarone (PACERONE) 200 MG tablet, 200 mg BID twice daily until 02/18/17, Then take 200 mg daily., Disp: 40 tablet, Rfl: 6 .  carvedilol (COREG) 6.25 MG tablet, Take 1.5 tablets (9.375 mg total) by mouth 2 (two) times daily., Disp: 90 tablet, Rfl: 3 .  digoxin (LANOXIN) 0.125 MG tablet, Take 0.5 tablets (0.0625 mg total) by mouth daily., Disp: 15 tablet, Rfl: 6 .  diphenhydramine-acetaminophen (TYLENOL PM) 25-500 MG TABS tablet, Take 1 tablet by mouth at bedtime as needed (sleep)., Disp: , Rfl:  .  Evolocumab (REPATHA SURECLICK) 939 MG/ML SOAJ, Inject 1 pen into the skin every 14 (fourteen) days., Disp: , Rfl:  .  furosemide (LASIX) 20 MG tablet, Take 4 tablets (80 mg total) by mouth daily., Disp: 120 tablet, Rfl: 6 .  glimepiride (AMARYL) 2 MG tablet, TAKE 1 TABLET(2 MG) BY MOUTH DAILY BEFORE BREAKFAST, Disp: 90 tablet, Rfl: 1 .  Lancets (ACCU-CHEK SOFT TOUCH) lancets, Check blood sugars 1-2 times per day. DX: E11.65, Disp: 200 each, Rfl: 5 .  losartan (COZAAR) 25 MG tablet, Take 1 tablet (25 mg total) by mouth 2 (two) times daily., Disp: 60 tablet, Rfl: 6 .  Multiple Vitamins-Minerals (CENTRUM SILVER ADULT 50+) TABS, Take 1 tablet by mouth daily., Disp: , Rfl:  .   pravastatin (PRAVACHOL) 20 MG tablet, Take 1 tablet (20 mg total) by mouth every evening., Disp: 30 tablet, Rfl: 2 .  Rivaroxaban (XARELTO) 15 MG TABS tablet, Take 1 tablet (15 mg total) by mouth daily., Disp: 30 tablet, Rfl: 3 .  spironolactone (ALDACTONE) 25 MG tablet, Take 0.5 tablets (12.5 mg total) by mouth daily., Disp: 15 tablet, Rfl: 6 .  ticagrelor (BRILINTA) 60 MG TABS tablet, Take 1 tablet (60 mg total) by mouth 2 (two) times daily., Disp: 60 tablet, Rfl: 6 .  traMADol (ULTRAM) 50 MG tablet, Take 1 tablet (50 mg total) by mouth every 6 (six) hours as needed for moderate pain (at device insertion site). (Patient not taking: Reported on 03/09/2017), Disp: 30 tablet, Rfl: 0  Past Medical History: Past Medical History:  Diagnosis Date  . Adenocarcinoma of prostate ([place])    s/p seed implants  . Arthritis   . Cellulitis of left leg    a. 0/3009 complicated by septic shock  . Chronic combined systolic and diastolic CHF (congestive heart failure) ([place])   . Colon polyps   . CORONARY ATHEROSCLEROSIS NATIVE CORONARY ARTERY    a. 01/2011 Cath/PCI: LM nl, LAD 40-50p, D1 80-small, LCX 95-small, RI 90, RCA 100, EF 20%;  b. 01/2011 Card MRI - No transmural scar;  c. 01/2011 PCI RCA->5 Promus DES, RI->3.0x16 Promus DES; d. Cath 01/13/13 patent LAD & Ramus, diffuse LCx dz, RCA mult overlapping stents w/ 95%  osital stenosis, EF 20%, s/p DES to ostial/prox RCA 01/24/13   . Diabetes mellitus, type 2 ([place])   . GERD (gastroesophageal reflux disease)   . Hematoma of leg    a. left leg hematoma 03/2012 in the setting of asa/effient  . Herpes zoster ophthalmicus   . HYPERLIPIDEMIA    intolerant to Lipitor (myalgias)  . HYPERTENSION   . Ischemic cardiomyopathy    a. 01/2012 Echo EF 45%, mild LVH; b. VE93%, grade 1 diastolic dysfunction, diffuse hypokinesis, inferoposterior akinesis   . Noncompliance   . Obesity   . OSA (obstructive sleep apnea)   . Polymyalgia rheumatica ([facility])     Tobacco Use: History   Smoking Status  . Former Smoker  . Packs/day: 0.30  . Years: 20.00  . Types: Cigarettes  . Quit date: 02/23/1989  Smokeless Tobacco  . Never Used    Labs: Recent Review Flowsheet Data    Labs for ITP Cardiac and Pulmonary Rehab Latest Ref Rng & Units 07/14/2016 10/06/2016 11/14/2016 11/14/2016 12/23/2016   Cholestrol 0 - 200 mg/dL - 146 - - 189   LDLCALC 0 - 99 mg/dL - 87 - - 104(H)   LDLDIRECT mg/dL - - - - -   HDL >40 mg/dL - 32(L) - - 33(L)   Trlycerides <150 mg/dL - 135 - - 259(H)   Hemoglobin A1c 4.6 - 6.5 % 8.7(H) - - - -   PHART 7.350 - 7.450 - - - - -   PCO2ART 35.0 - 45.0 mmHg - - - - -   HCO3 20.0 - 28.0 mmol/L - - 24.6 24.6 -   TCO2 0 - 100 mmol/L - - 26 26 -   ACIDBASEDEF 0.0 - 2.0 mmol/L - - 1.0 1.0 -   O2SAT % - - 57.0 57.0 -      Capillary Blood Glucose: Lab Results  Component Value Date   GLUCAP 222 (H) 02/13/2017   GLUCAP 233 (H) 02/12/2017   GLUCAP 279 (H) 02/12/2017   GLUCAP 297 (H) 02/12/2017   GLUCAP 209 (H) 02/11/2017     Exercise Target Goals:    Exercise Program Goal: Individual exercise prescription set with THRR, safety & activity barriers. Participant demonstrates ability to understand and report RPE using BORG scale, to self-measure pulse accurately, and to acknowledge the importance of the exercise prescription.  Exercise Prescription Goal: Starting with aerobic activity 30 plus minutes a day, 3 days per week for initial exercise prescription. Provide home exercise prescription and guidelines that participant acknowledges understanding prior to discharge.  Activity Barriers & Risk Stratification:     Activity Barriers & Cardiac Risk Stratification - 12/16/16 1616      Activity Barriers & Cardiac Risk Stratification   Cardiac Risk Stratification High      6 Minute Walk:     6 Minute Walk    Row Name 12/16/16 1616         6 Minute Walk   Phase Initial     Distance 1175 feet     Walk Time 6 minutes     # of Rest Breaks 0      MPH 2.2     METS 2.35     RPE 13     VO2 Peak 8.24     Symptoms Yes (comment)     Comments 5/10 L hip/knee pain     Resting HR 98 bpm     Resting BP 108/64     Max Ex. HR 124 bpm  Max Ex. BP 134/62     2 Minute Post BP 106/60        Oxygen Initial Assessment:   Oxygen Re-Evaluation:   Oxygen Discharge (Final Oxygen Re-Evaluation):   Initial Exercise Prescription:     Initial Exercise Prescription - 12/16/16 1600      Date of Initial Exercise RX and Referring Provider   Date 12/16/16   Referring Provider Loralie Champagne, MD     Recumbant Bike   Level 2   Minutes 10   METs 1.2     NuStep   Level 2   Minutes 10   METs 1.5     Track   Laps 8   Minutes 10   METs 2.39     Prescription Details   Frequency (times per week) 3   Duration Progress to 30 minutes of continuous aerobic without signs/symptoms of physical distress     Intensity   THRR 40-80% of Max Heartrate 59-118   Ratings of Perceived Exertion 11-13   Perceived Dyspnea 0-4     Progression   Progression Continue progressive overload as per policy without signs/symptoms or physical distress.     Resistance Training   Training Prescription Yes   Weight 2lbs   Reps 10-12      Perform Capillary Blood Glucose checks as needed.  Exercise Prescription Changes:      Exercise Prescription Changes    Row Name 01/13/17 1500 02/10/17 1200 03/09/17 1600         Response to Exercise   Blood Pressure (Admit) 118/62 110/60 116/60     Blood Pressure (Exercise) 120/62 128/60 134/64     Blood Pressure (Exit) 104/60 110/60 104/60     Heart Rate (Admit) 97 bpm 77 bpm 74 bpm     Heart Rate (Exercise) 105 bpm 105 bpm 88 bpm     Heart Rate (Exit) 96 bpm 72 bpm 71 bpm     Rating of Perceived Exertion (Exercise) _0 Symptoms none none none     Comments Reviewed HEP on 01/07/17 Reviewed HEP on 01/07/17 Reviewed HEP on 01/07/17     Duration Progress to 30 minutes of continuous aerobic without  signs/symptoms of physical distress Progress to 30 minutes of  aerobic without signs/symptoms of physical distress Progress to 30 minutes of  aerobic without signs/symptoms of physical distress     Intensity THRR unchanged THRR unchanged THRR unchanged       Progression   Average METs 2.4 2.4 2.3       Resistance Training   Training Prescription Yes Yes Yes     Weight 5lbs 5lbs 5lbs     Reps 10-12 10-15 10-15     Time  - 10 Minutes 10 Minutes       Recumbant Bike   Level _1 Minutes _2 METs 2.1 2.3 2.2       NuStep   Level _3 Minutes _4 METs 2.7 2.7 2.2       Arm Ergometer   Level _5 Watts _6 Minutes _7 METs 2.25 2.27 2.3       Home Exercise Plan   Plans to continue exercise at Home  Reviewed HEP on 01/07/17 see progress note Home (comment)  Reviewed HEP on 01/07/17 see progress note Home (comment)  Reviewed HEP on 01/07/17 see progress note     Frequency Add 2 additional days to program exercise sessions. Add 2 additional days to program exercise sessions. Add 2 additional days to program exercise sessions.     Initial Home Exercises Provided  - 01/07/17 01/07/17       Exercise Review   Progression Yes Yes Yes        Exercise Comments:      Exercise Comments    Row Name 01/13/17 1534 02/09/17 1105         Exercise Comments Reviewed METs and goals. Pt is tolerating exercise well; will continue to monitor exercise progression. Reviewed METs and goals. Pt is tolerating exercise well; will continue to monitor exercise progression.         Exercise Goals and Review:      Exercise Goals    Row Name 02/09/17 1106             Exercise Goals   Increase Physical Activity Yes       Intervention Provide advice, education, support and counseling about physical activity/exercise needs.;Develop an individualized exercise prescription for aerobic and resistive training based on initial evaluation findings,  risk stratification, comorbidities and participant's personal goals.       Expected Outcomes Achievement of increased cardiorespiratory fitness and enhanced flexibility, muscular endurance and strength shown through measurements of functional capacity and personal statement of participant.       Increase Strength and Stamina Yes       Intervention Provide advice, education, support and counseling about physical activity/exercise needs.;Develop an individualized exercise prescription for aerobic and resistive training based on initial evaluation findings, risk stratification, comorbidities and participant's personal goals.       Expected Outcomes Achievement of increased cardiorespiratory fitness and enhanced flexibility, muscular endurance and strength shown through measurements of functional capacity and personal statement of participant.          Exercise Goals Re-Evaluation :     Exercise Goals Re-Evaluation    [place] Name 02/10/17 1455 02/10/17 1456           Exercise Goal Re-Evaluation   Exercise Goals Review Increase Physical Activity;Increase Strenth and Stamina  -      Comments Pt is increasing in actvity levels and has improved MET levels Pt is increasing in actvity levels and has increased workload on Nustep machine.      Expected Outcomes  - Pt will continue to improve in cardiorespiratory fitness          Discharge Exercise Prescription (Final Exercise Prescription Changes):     Exercise Prescription Changes - 03/09/17 1600      Response to Exercise   Blood Pressure (Admit) 116/60   Blood Pressure (Exercise) 134/64   Blood Pressure (Exit) 104/60   Heart Rate (Admit) 74 bpm   Heart Rate (Exercise) 88 bpm   Heart Rate (Exit) 71 bpm   Rating of Perceived Exertion (Exercise) 12   Symptoms none   Comments Reviewed HEP on 01/07/17   Duration Progress to 30 minutes of  aerobic without signs/symptoms of physical distress   Intensity THRR unchanged     Progression   Average  METs 2.3     Resistance Training   Training Prescription Yes   Weight 5lbs   Reps 10-15   Time 10 Minutes     Recumbant Bike   Level 3   Minutes 10   METs 2.2  NuStep   Level 4   Minutes 10   METs 2.2     Arm Ergometer   Level 1   Watts 25   Minutes 10   METs 2.3     Home Exercise Plan   Plans to continue exercise at Home (comment)  Reviewed HEP on 01/07/17 see progress note   Frequency Add 2 additional days to program exercise sessions.   Initial Home Exercises Provided 01/07/17     Exercise Review   Progression Yes      Nutrition:  Target Goals: Understanding of nutrition guidelines, daily intake of sodium <1567m, cholesterol <2068m calories 30% from fat and 7% or less from saturated fats, daily to have 5 or more servings of fruits and vegetables.  Biometrics:     Pre Biometrics - 12/16/16 1619      Pre Biometrics   Height 5' 7.5" (1.715 m)   Weight 225 lb 15.5 oz (102.5 kg)   Waist Circumference 46 inches   Hip Circumference 46 inches   Waist to Hip Ratio 1 %   BMI (Calculated) 34.9   Triceps Skinfold 12 mm   % Body Fat 32.4 %   Grip Strength 42 kg   Flexibility 18 in   Single Leg Stand 18 seconds       Nutrition Therapy Plan and Nutrition Goals:     Nutrition Therapy & Goals - 01/16/17 1215      Nutrition Therapy   Diet Carb Modified, Therapeutic Lifestyle Changes     Personal Nutrition Goals   Nutrition Goal Wt loss of 1-2 lb/week to a wt loss goal of 6-24 lb at graduation from CaPetersoal #2 Improved glycemic control as evidenced by a decrease in A1c from 8.7 to a goal of < 7%.     Intervention Plan   Intervention Prescribe, educate and counsel regarding individualized specific dietary modifications aiming towards targeted core components such as weight, hypertension, lipid management, diabetes, heart failure and other comorbidities.   Expected Outcomes Short Term Goal: Understand basic principles of dietary content,  such as calories, fat, sodium, cholesterol and nutrients.;Long Term Goal: Adherence to prescribed nutrition plan.      Nutrition Discharge: Nutrition Scores:     Nutrition Assessments - 01/16/17 1215      MEDFICTS Scores   Pre Score 44      Nutrition Goals Re-Evaluation:   Nutrition Goals Re-Evaluation:   Nutrition Goals Discharge (Final Nutrition Goals Re-Evaluation):   Psychosocial: Target Goals: Acknowledge presence or absence of significant depression and/or stress, maximize coping skills, provide positive support system. Participant is able to verbalize types and ability to use techniques and skills needed for reducing stress and depression.  Initial Review & Psychosocial Screening:     Initial Psych Review & Screening - 12/18/16 0935      Family Dynamics   Good Support System? Yes   Comments A brief assesment reveals no further psychosocial needed at this time.     Barriers   Psychosocial barriers to participate in program There are no identifiable barriers or psychosocial needs.     Screening Interventions   Interventions Encouraged to exercise      Quality of Life Scores:     Quality of Life - 12/16/16 1357      Quality of Life Scores   Health/Function Pre 17.23 %   Socioeconomic Pre 21.5 %   Psych/Spiritual Pre 27 %   Family Pre 20.63 %   GLOBAL Pre 20.5 %  PHQ-9: Recent Review Flowsheet Data    Depression screen [facility] [address] 2/9 03/09/2017 12/24/2016 09/30/2016   Decreased Interest 0 0 0   Down, Depressed, Hopeless 0 0 0   PHQ - 2 Score 0 0 0     Interpretation of Total Score  Total Score Depression Severity:  1-4 = Minimal depression, 5-9 = Mild depression, 10-14 = Moderate depression, 15-19 = Moderately severe depression, 20-27 = Severe depression   Psychosocial Evaluation and Intervention:     Psychosocial Evaluation - 03/09/17 1050      Psychosocial Evaluation & Interventions   Interventions Encouraged to exercise with the program and  follow exercise prescription;Stress management education;Relaxation education   Continue Psychosocial Services  Follow up required by staff      Psychosocial Re-Evaluation:     Psychosocial Re-Evaluation    [place] Name 01/15/17 1542 02/11/17 1856 03/09/17 1051         Psychosocial Re-Evaluation   Current issues with  - Current Anxiety/Panic Current Anxiety/Panic     Comments Pt with good support system at home. Pt with good support system at home. pt is encouraged he is able to return to cardiac rehab. pt reports he feels better since his ICD implant.  pt has good support system at home.     Expected Outcomes  -  - pt will exhibit good coping skills with positive, hopeful outlook.      Interventions Encouraged to attend Cardiac Rehabilitation for the exercise;Relaxation education;Stress management education Stress management education;Relaxation education;Encouraged to attend Cardiac Rehabilitation for the exercise Stress management education;Relaxation education;Encouraged to attend Cardiac Rehabilitation for the exercise     Continue Psychosocial Services   -  - Follow up required by staff        Psychosocial Discharge (Final Psychosocial Re-Evaluation):     Psychosocial Re-Evaluation - 03/09/17 1051      Psychosocial Re-Evaluation   Current issues with Current Anxiety/Panic   Comments pt is encouraged he is able to return to cardiac rehab. pt reports he feels better since his ICD implant.  pt has good support system at home.   Expected Outcomes pt will exhibit good coping skills with positive, hopeful outlook.    Interventions Stress management education;Relaxation education;Encouraged to attend Cardiac Rehabilitation for the exercise   Continue Psychosocial Services  Follow up required by staff      Vocational Rehabilitation: Provide vocational rehab assistance to qualifying candidates.   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 Participant will state understanding/return demonstration of topics presented.  Learning Barriers/Preferences:     Learning Barriers/Preferences - 12/16/16 1455      Learning Barriers/Preferences   Learning Barriers Sight   Learning Preferences Verbal Instruction;Written Material;Skilled Demonstration;Individual Instruction;Group Instruction      Education Topics: Count Your Pulse:  -Group instruction provided by verbal instruction, demonstration, patient participation and written materials to support subject.  Instructors address importance of being able to find your pulse and how to count your pulse when at home without a heart monitor.  Patients get hands on experience counting their pulse with staff help and individually.   Heart Attack, Angina, and Risk Factor Modification:  -Group instruction provided by verbal  instruction, video, and written materials to support subject.  Instructors address signs and symptoms of angina and heart attacks.    Also discuss risk factors for heart disease and how to make changes to improve heart health risk factors.   CARDIAC REHAB PHASE II EXERCISE from 02/06/2017 in [place]  Date  01/07/17  Instruction Review Code  2- meets goals/outcomes      Functional Fitness:  -Group instruction provided by verbal instruction, demonstration, patient participation, and written materials to support subject.  Instructors address safety measures for doing things around the house.  Discuss how to get up and down off the floor, how to pick things up properly, how to safely get out of a chair without assistance, and balance training.   Meditation and Mindfulness:  -Group  instruction provided by verbal instruction, patient participation, and written materials to support subject.  Instructor addresses importance of mindfulness and meditation practice to help reduce stress and improve awareness.  Instructor also leads participants through a meditation exercise.    CARDIAC REHAB PHASE II EXERCISE from 02/06/2017 in [place]  Date  02/04/17  Educator  [place]  Instruction Review Code  2- meets goals/outcomes      Stretching for Flexibility and Mobility:  -Group instruction provided by verbal instruction, patient participation, and written materials to support subject.  Instructors lead participants through series of stretches that are designed to increase flexibility thus improving mobility.  These stretches are additional exercise for major muscle groups that are typically performed during regular warm up and cool down.   CARDIAC REHAB PHASE II EXERCISE from 02/06/2017 in [place]  Date  01/09/17  Instruction Review Code  2- meets goals/outcomes      Hands Only CPR Anytime:  -Group instruction provided by verbal instruction, video, patient participation and written materials to support subject.  Instructors co-teach with AHA video for hands only CPR.  Participants get hands on experience with mannequins.   Nutrition I class: Heart Healthy Eating:  -Group instruction provided by PowerPoint slides, verbal discussion, and written materials to support subject matter. The instructor gives an explanation and review of the Therapeutic Lifestyle Changes diet recommendations, which includes a discussion on lipid goals, dietary fat, sodium, fiber, plant stanol/sterol esters, sugar, and the components of a well-balanced, healthy diet.   Nutrition II class: Lifestyle Skills:  -Group instruction provided by PowerPoint slides, verbal discussion, and written materials to support subject matter. The  instructor gives an explanation and review of label reading, grocery shopping for heart health, heart healthy recipe modifications, and ways to make healthier choices when eating out.   Diabetes Question & Answer:  -Group instruction provided by PowerPoint slides, verbal discussion, and written materials to support subject matter. The instructor gives an explanation and review of diabetes co-morbidities, pre- and post-prandial blood glucose goals, pre-exercise blood glucose goals, signs, symptoms, and treatment of hypoglycemia and hyperglycemia, and foot care basics.   CARDIAC REHAB PHASE II EXERCISE from 02/06/2017 in [place]  Date  01/23/17  Educator  [place]  Instruction Review Code  2- meets goals/outcomes      Diabetes Blitz:  -Group instruction provided by PowerPoint slides, verbal discussion, and written materials to support subject matter. The instructor gives an explanation and review of the physiology behind type 1 and type 2 diabetes, diabetes medications and rational behind using different medications, pre- and post-prandial blood glucose recommendations and  Hemoglobin A1c goals, diabetes diet, and exercise including blood glucose guidelines for exercising safely.    Portion Distortion:  -Group instruction provided by PowerPoint slides, verbal discussion, written materials, and food models to support subject matter. The instructor gives an explanation of serving size versus portion size, changes in portions sizes over the last 20 years, and what consists of a serving from each food group.   Stress Management:  -Group instruction provided by verbal instruction, video, and written materials to support subject matter.  Instructors review role of stress in heart disease and how to cope with stress positively.     CARDIAC REHAB PHASE II EXERCISE from 02/06/2017 in [place]  Date  01/02/17  Instruction Review Code  2- meets  goals/outcomes      Exercising on Your Own:  -Group instruction provided by verbal instruction, power point, and written materials to support subject.  Instructors discuss benefits of exercise, components of exercise, frequency and intensity of exercise, and end points for exercise.  Also discuss use of nitroglycerin and activating EMS.  Review options of places to exercise outside of rehab.  Review guidelines for sex with heart disease.   CARDIAC REHAB PHASE II EXERCISE from 02/06/2017 in [place]  Date  01/14/17  Educator  [place] EP  Instruction Review Code  2- meets goals/outcomes      Cardiac Drugs I:  -Group instruction provided by verbal instruction and written materials to support subject.  Instructor reviews cardiac drug classes: antiplatelets, anticoagulants, beta blockers, and statins.  Instructor discusses reasons, side effects, and lifestyle considerations for each drug class.   CARDIAC REHAB PHASE II EXERCISE from 02/06/2017 in [place]  Date  01/28/17  Instruction Review Code  2- meets goals/outcomes      Cardiac Drugs II:  -Group instruction provided by verbal instruction and written materials to support subject.  Instructor reviews cardiac drug classes: angiotensin converting enzyme inhibitors (ACE-I), angiotensin II receptor blockers (ARBs), nitrates, and calcium channel blockers.  Instructor discusses reasons, side effects, and lifestyle considerations for each drug class.   CARDIAC REHAB PHASE II EXERCISE from 02/06/2017 in [place]  Date  12/24/16  Educator  Pharmacist  Instruction Review Code  2- meets goals/outcomes      Anatomy and Physiology of the Circulatory System:  -Group instruction provided by verbal instruction, video, and written materials to support subject.  Reviews functional anatomy of heart, how it relates to various diagnoses, and what role the  heart plays in the overall system.   CARDIAC REHAB PHASE II EXERCISE from 02/06/2017 in [place]  Date  01/21/17  Instruction Review Code  2- meets goals/outcomes      Knowledge Questionnaire Score:     Knowledge Questionnaire Score - 12/16/16 1357      Knowledge Questionnaire Score   Pre Score 19/24      Core Components/Risk Factors/Patient Goals at Admission:     Personal Goals and Risk Factors at Admission - 12/16/16 1458      Core Components/Risk Factors/Patient Goals on Admission    Weight Management Yes;Weight Loss;Obesity;Weight Maintenance   Intervention Weight Management: Develop a combined nutrition and exercise program designed to reach desired caloric intake, while maintaining appropriate intake of nutrient and fiber, sodium and fats, and appropriate energy expenditure required for the weight goal.;Weight Management: Provide education and appropriate resources to help participant work on and  attain dietary goals.;Weight Management/Obesity: Establish reasonable short term and long term weight goals.;Obesity: Provide education and appropriate resources to help participant work on and attain dietary goals.   Expected Outcomes Short Term: Continue to assess and modify interventions until short term weight is achieved;Long Term: Adherence to nutrition and physical activity/exercise program aimed toward attainment of established weight goal;Weight Maintenance: Understanding of the daily nutrition guidelines, which includes 25-35% calories from fat, 7% or less cal from saturated fats, less than 261m cholesterol, less than 1.5gm of sodium, & 5 or more servings of fruits and vegetables daily;Weight Loss: Understanding of general recommendations for a balanced deficit meal plan, which promotes 1-2 lb weight loss per week and includes a negative energy balance of [phone] kcal/d;Understanding recommendations for meals to include 15-35% energy as protein,  25-35% energy from fat, 35-60% energy from carbohydrates, less than 2072mof dietary cholesterol, 20-35 gm of total fiber daily;Understanding of distribution of calorie intake throughout the day with the consumption of 4-5 meals/snacks   Sedentary Yes   Intervention Provide advice, education, support and counseling about physical activity/exercise needs.;Develop an individualized exercise prescription for aerobic and resistive training based on initial evaluation findings, risk stratification, comorbidities and participant's personal goals.   Expected Outcomes Achievement of increased cardiorespiratory fitness and enhanced flexibility, muscular endurance and strength shown through measurements of functional capacity and personal statement of participant.   Increase Strength and Stamina Yes   Intervention Provide advice, education, support and counseling about physical activity/exercise needs.;Develop an individualized exercise prescription for aerobic and resistive training based on initial evaluation findings, risk stratification, comorbidities and participant's personal goals.   Expected Outcomes Achievement of increased cardiorespiratory fitness and enhanced flexibility, muscular endurance and strength shown through measurements of functional capacity and personal statement of participant.   Improve shortness of breath with ADL's Yes   Intervention Provide education, individualized exercise plan and daily activity instruction to help decrease symptoms of SOB with activities of daily living.   Expected Outcomes Short Term: Achieves a reduction of symptoms when performing activities of daily living.   Diabetes Yes   Intervention Provide education about signs/symptoms and action to take for hypo/hyperglycemia.;Provide education about proper nutrition, including hydration, and aerobic/resistive exercise prescription along with prescribed medications to achieve blood glucose in normal ranges: Fasting  glucose 65-99 mg/dL   Expected Outcomes Short Term: Participant verbalizes understanding of the signs/symptoms and immediate care of hyper/hypoglycemia, proper foot care and importance of medication, aerobic/resistive exercise and nutrition plan for blood glucose control.;Long Term: Attainment of HbA1C < 7%.   Hypertension Yes   Intervention Provide education on lifestyle modifcations including regular physical activity/exercise, weight management, moderate sodium restriction and increased consumption of fresh fruit, vegetables, and low fat dairy, alcohol moderation, and smoking cessation.;Monitor prescription use compliance.   Expected Outcomes Short Term: Continued assessment and intervention until BP is < 140/9094mG in hypertensive participants. < 130/35m27m in hypertensive participants with diabetes, heart failure or chronic kidney disease.;Long Term: Maintenance of blood pressure at goal levels.   Lipids Yes   Intervention Provide education and support for participant on nutrition & aerobic/resistive exercise along with prescribed medications to achieve LDL <70mg35mL >40mg.4mxpected Outcomes Short Term: Participant states understanding of desired cholesterol values and is compliant with medications prescribed. Participant is following exercise prescription and nutrition guidelines.;Long Term: Cholesterol controlled with medications as prescribed, with individualized exercise RX and with personalized nutrition plan. Value goals: LDL < 70mg, 93m> 40 mg.   Personal Goal  Other Yes   Personal Goal Improve walking tolerance and overall CV health.   Intervention Provide exercise programming to assist with improving exercise capacity and provide cardiac education classes to increase awareness and knowledge   Expected Outcomes Pt will be able to walk with little to no symptoms of leg pain. Pt will improve overall quality of life      Core Components/Risk Factors/Patient Goals Review:      Goals  and Risk Factor Review    Row Name 01/13/17 1535 03/10/17 1659           Core Components/Risk Factors/Patient Goals Review   Personal Goals Review Other Weight Management/Obesity;Improve shortness of breath with ADL's;Heart Failure;Lipids;Hypertension;Diabetes;Other      Review Pt is walking 2x/week at home for exercise without difficulty pt returned to [facility] after extended absence for ICD placement.  pt reports he feels better and is eager to resume exercise activities.       Expected Outcomes Pt will improve in exercise tolerance and overall fitness levels. Pt will improve in exercise tolerance and overall fitness levels with decreased dyspnea and fatigue.          Core Components/Risk Factors/Patient Goals at Discharge (Final Review):      Goals and Risk Factor Review - 03/10/17 1659      Core Components/Risk Factors/Patient Goals Review   Personal Goals Review Weight Management/Obesity;Improve shortness of breath with ADL's;Heart Failure;Lipids;Hypertension;Diabetes;Other   Review pt returned to [facility] after extended absence for ICD placement.  pt reports he feels better and is eager to resume exercise activities.    Expected Outcomes Pt will improve in exercise tolerance and overall fitness levels with decreased dyspnea and fatigue.       ITP Comments:     ITP Comments    Row Name 12/16/16 1339 01/16/17 1105         ITP Comments Dr Sherle Poe, Medical Director Patient attended Hypertension Class on 01/16/17         Comments: Pt is making expected progress toward personal goals after completing 21 sessions. Recommend continued exercise and life style modification education including  stress management and relaxation techniques to decrease cardiac risk profile.

## 2017-03-11 ENCOUNTER — Encounter (HOSPITAL_COMMUNITY): Payer: Medicare Other

## 2017-03-11 ENCOUNTER — Encounter (HOSPITAL_COMMUNITY)
Admission: RE | Admit: 2017-03-11 | Discharge: 2017-03-11 | Disposition: A | Discharge status: Home / Self Care | Payer: Medicare Other | Source: Ambulatory Visit | Attending: Cardiology | Admitting: Cardiology

## 2017-03-11 ENCOUNTER — Ambulatory Visit (HOSPITAL_COMMUNITY): Payer: Medicare Other

## 2017-03-11 ENCOUNTER — Other Ambulatory Visit (HOSPITAL_COMMUNITY): Payer: Self-pay | Admitting: Pharmacist

## 2017-03-11 DIAGNOSIS — I2510 Atherosclerotic heart disease of native coronary artery without angina pectoris: Secondary | ICD-10-CM | POA: Diagnosis not present

## 2017-03-11 DIAGNOSIS — Z7982 Long term (current) use of aspirin: Secondary | ICD-10-CM | POA: Diagnosis not present

## 2017-03-11 DIAGNOSIS — I5042 Chronic combined systolic (congestive) and diastolic (congestive) heart failure: Secondary | ICD-10-CM | POA: Diagnosis not present

## 2017-03-11 DIAGNOSIS — Z79899 Other long term (current) drug therapy: Secondary | ICD-10-CM | POA: Diagnosis not present

## 2017-03-11 DIAGNOSIS — Z955 Presence of coronary angioplasty implant and graft: Secondary | ICD-10-CM

## 2017-03-11 DIAGNOSIS — I110 Hypertensive heart disease with heart failure: Secondary | ICD-10-CM | POA: Diagnosis not present

## 2017-03-11 MED ORDER — RIVAROXABAN 15 MG PO TABS: 15.0000 mg | ORAL_TABLET | Freq: Every day | ORAL | 3 refills | Status: DC

## 2017-03-12 DIAGNOSIS — Z006 Encounter for examination for normal comparison and control in clinical research program: Secondary | ICD-10-CM
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
## 2017-03-13 ENCOUNTER — Ambulatory Visit (HOSPITAL_COMMUNITY): Payer: Medicare Other

## 2017-03-13 ENCOUNTER — Encounter (HOSPITAL_COMMUNITY)
Admission: RE | Admit: 2017-03-13 | Discharge: 2017-03-13 | Disposition: A | Discharge status: Home / Self Care | Payer: Medicare Other | Source: Ambulatory Visit | Attending: Cardiology | Admitting: Cardiology

## 2017-03-13 ENCOUNTER — Encounter (HOSPITAL_COMMUNITY): Payer: Medicare Other

## 2017-03-13 DIAGNOSIS — Z955 Presence of coronary angioplasty implant and graft: Secondary | ICD-10-CM

## 2017-03-13 DIAGNOSIS — Z79899 Other long term (current) drug therapy: Secondary | ICD-10-CM | POA: Diagnosis not present

## 2017-03-13 DIAGNOSIS — I5042 Chronic combined systolic (congestive) and diastolic (congestive) heart failure: Secondary | ICD-10-CM | POA: Diagnosis not present

## 2017-03-13 DIAGNOSIS — I2510 Atherosclerotic heart disease of native coronary artery without angina pectoris: Secondary | ICD-10-CM | POA: Diagnosis not present

## 2017-03-13 DIAGNOSIS — I110 Hypertensive heart disease with heart failure: Secondary | ICD-10-CM | POA: Diagnosis not present

## 2017-03-13 DIAGNOSIS — Z7982 Long term (current) use of aspirin: Secondary | ICD-10-CM | POA: Diagnosis not present

## 2017-03-16 ENCOUNTER — Ambulatory Visit (HOSPITAL_COMMUNITY): Payer: Medicare Other

## 2017-03-16 ENCOUNTER — Encounter (HOSPITAL_COMMUNITY)
Admission: RE | Admit: 2017-03-16 | Discharge: 2017-03-16 | Disposition: A | Discharge status: Home / Self Care | Payer: Medicare Other | Source: Ambulatory Visit | Attending: Cardiology | Admitting: Cardiology

## 2017-03-16 ENCOUNTER — Encounter (HOSPITAL_COMMUNITY): Payer: Medicare Other

## 2017-03-16 DIAGNOSIS — Z955 Presence of coronary angioplasty implant and graft: Secondary | ICD-10-CM

## 2017-03-16 DIAGNOSIS — E119 Type 2 diabetes mellitus without complications: Secondary | ICD-10-CM | POA: Insufficient documentation

## 2017-03-16 DIAGNOSIS — I2510 Atherosclerotic heart disease of native coronary artery without angina pectoris: Secondary | ICD-10-CM | POA: Diagnosis not present

## 2017-03-16 DIAGNOSIS — Z7984 Long term (current) use of oral hypoglycemic drugs: Secondary | ICD-10-CM | POA: Insufficient documentation

## 2017-03-16 DIAGNOSIS — Z8546 Personal history of malignant neoplasm of prostate: Secondary | ICD-10-CM | POA: Diagnosis not present

## 2017-03-16 DIAGNOSIS — G4733 Obstructive sleep apnea (adult) (pediatric): Secondary | ICD-10-CM | POA: Insufficient documentation

## 2017-03-16 DIAGNOSIS — E669 Obesity, unspecified: Secondary | ICD-10-CM | POA: Diagnosis not present

## 2017-03-16 DIAGNOSIS — Z87891 Personal history of nicotine dependence: Secondary | ICD-10-CM | POA: Diagnosis not present

## 2017-03-16 DIAGNOSIS — K219 Gastro-esophageal reflux disease without esophagitis: Secondary | ICD-10-CM | POA: Diagnosis not present

## 2017-03-16 DIAGNOSIS — Z7982 Long term (current) use of aspirin: Secondary | ICD-10-CM | POA: Insufficient documentation

## 2017-03-16 DIAGNOSIS — M1990 Unspecified osteoarthritis, unspecified site: Secondary | ICD-10-CM | POA: Diagnosis not present

## 2017-03-16 DIAGNOSIS — E785 Hyperlipidemia, unspecified: Secondary | ICD-10-CM | POA: Diagnosis not present

## 2017-03-16 DIAGNOSIS — I110 Hypertensive heart disease with heart failure: Secondary | ICD-10-CM | POA: Diagnosis not present

## 2017-03-16 DIAGNOSIS — Z79899 Other long term (current) drug therapy: Secondary | ICD-10-CM | POA: Insufficient documentation

## 2017-03-16 DIAGNOSIS — I5042 Chronic combined systolic (congestive) and diastolic (congestive) heart failure: Secondary | ICD-10-CM | POA: Insufficient documentation

## 2017-03-16 DIAGNOSIS — I255 Ischemic cardiomyopathy: Secondary | ICD-10-CM | POA: Insufficient documentation

## 2017-03-16 DIAGNOSIS — M353 Polymyalgia rheumatica: Secondary | ICD-10-CM | POA: Insufficient documentation

## 2017-03-16 DIAGNOSIS — Z6834 Body mass index (BMI) 34.0-34.9, adult: Secondary | ICD-10-CM | POA: Diagnosis not present

## 2017-03-18 ENCOUNTER — Encounter (HOSPITAL_COMMUNITY): Payer: Medicare Other

## 2017-03-18 ENCOUNTER — Ambulatory Visit (HOSPITAL_COMMUNITY): Payer: Medicare Other

## 2017-03-18 ENCOUNTER — Encounter (HOSPITAL_COMMUNITY)
Admission: RE | Admit: 2017-03-18 | Discharge: 2017-03-18 | Disposition: A | Discharge status: Home / Self Care | Payer: Medicare Other | Source: Ambulatory Visit | Attending: Cardiology | Admitting: Cardiology

## 2017-03-18 DIAGNOSIS — Z955 Presence of coronary angioplasty implant and graft: Secondary | ICD-10-CM

## 2017-03-18 DIAGNOSIS — Z7982 Long term (current) use of aspirin: Secondary | ICD-10-CM | POA: Diagnosis not present

## 2017-03-18 DIAGNOSIS — I110 Hypertensive heart disease with heart failure: Secondary | ICD-10-CM | POA: Diagnosis not present

## 2017-03-18 DIAGNOSIS — I5042 Chronic combined systolic (congestive) and diastolic (congestive) heart failure: Secondary | ICD-10-CM | POA: Diagnosis not present

## 2017-03-18 DIAGNOSIS — I2510 Atherosclerotic heart disease of native coronary artery without angina pectoris: Secondary | ICD-10-CM | POA: Diagnosis not present

## 2017-03-18 DIAGNOSIS — Z79899 Other long term (current) drug therapy: Secondary | ICD-10-CM | POA: Diagnosis not present

## 2017-03-18 LAB — GLUCOSE, CAPILLARY: Glucose-Capillary: 292 mg/dL — ABNORMAL HIGH (ref 65–99)

## 2017-03-20 ENCOUNTER — Encounter (HOSPITAL_COMMUNITY)
Admission: RE | Admit: 2017-03-20 | Discharge: 2017-03-20 | Disposition: A | Discharge status: Home / Self Care | Payer: Medicare Other | Source: Ambulatory Visit | Attending: Cardiology | Admitting: Cardiology

## 2017-03-20 ENCOUNTER — Encounter (HOSPITAL_COMMUNITY): Payer: Medicare Other

## 2017-03-20 ENCOUNTER — Ambulatory Visit (HOSPITAL_COMMUNITY): Payer: Medicare Other

## 2017-03-20 DIAGNOSIS — I5042 Chronic combined systolic (congestive) and diastolic (congestive) heart failure: Secondary | ICD-10-CM | POA: Diagnosis not present

## 2017-03-20 DIAGNOSIS — Z79899 Other long term (current) drug therapy: Secondary | ICD-10-CM | POA: Diagnosis not present

## 2017-03-20 DIAGNOSIS — Z7982 Long term (current) use of aspirin: Secondary | ICD-10-CM | POA: Diagnosis not present

## 2017-03-20 DIAGNOSIS — I2510 Atherosclerotic heart disease of native coronary artery without angina pectoris: Secondary | ICD-10-CM | POA: Diagnosis not present

## 2017-03-20 DIAGNOSIS — I110 Hypertensive heart disease with heart failure: Secondary | ICD-10-CM | POA: Diagnosis not present

## 2017-03-20 DIAGNOSIS — Z955 Presence of coronary angioplasty implant and graft: Secondary | ICD-10-CM | POA: Diagnosis not present

## 2017-03-23 ENCOUNTER — Telehealth (HOSPITAL_COMMUNITY): Payer: Self-pay | Admitting: Pharmacist

## 2017-03-23 ENCOUNTER — Encounter (HOSPITAL_COMMUNITY)
Admission: RE | Admit: 2017-03-23 | Discharge: 2017-03-23 | Disposition: A | Discharge status: Home / Self Care | Payer: Medicare Other | Source: Ambulatory Visit | Attending: Cardiology | Admitting: Cardiology

## 2017-03-23 ENCOUNTER — Encounter (HOSPITAL_COMMUNITY): Payer: Medicare Other

## 2017-03-23 ENCOUNTER — Ambulatory Visit (HOSPITAL_COMMUNITY): Payer: Medicare Other

## 2017-03-23 DIAGNOSIS — I110 Hypertensive heart disease with heart failure: Secondary | ICD-10-CM | POA: Diagnosis not present

## 2017-03-23 DIAGNOSIS — I5042 Chronic combined systolic (congestive) and diastolic (congestive) heart failure: Secondary | ICD-10-CM | POA: Diagnosis not present

## 2017-03-23 DIAGNOSIS — I2510 Atherosclerotic heart disease of native coronary artery without angina pectoris: Secondary | ICD-10-CM | POA: Diagnosis not present

## 2017-03-23 DIAGNOSIS — Z955 Presence of coronary angioplasty implant and graft: Secondary | ICD-10-CM

## 2017-03-23 DIAGNOSIS — Z7982 Long term (current) use of aspirin: Secondary | ICD-10-CM | POA: Diagnosis not present

## 2017-03-23 DIAGNOSIS — Z79899 Other long term (current) drug therapy: Secondary | ICD-10-CM | POA: Diagnosis not present

## 2017-03-23 NOTE — Telephone Encounter (Signed)
[employer] patient assistance approved for Xarelto 15 mg daily through 03/19/18.   Ruta Hinds. Velva Harman, PharmD, BCPS, CPP Clinical Pharmacist Pager: [phone] Phone: [phone] 03/23/2017 12:17 PM

## 2017-03-25 ENCOUNTER — Encounter (HOSPITAL_COMMUNITY)
Admission: RE | Admit: 2017-03-25 | Discharge: 2017-03-25 | Disposition: A | Discharge status: Home / Self Care | Payer: Medicare Other | Source: Ambulatory Visit | Attending: Cardiology | Admitting: Cardiology

## 2017-03-25 ENCOUNTER — Ambulatory Visit (HOSPITAL_COMMUNITY): Payer: Medicare Other

## 2017-03-25 ENCOUNTER — Encounter (HOSPITAL_COMMUNITY): Payer: Medicare Other

## 2017-03-25 DIAGNOSIS — I5042 Chronic combined systolic (congestive) and diastolic (congestive) heart failure: Secondary | ICD-10-CM | POA: Diagnosis not present

## 2017-03-25 DIAGNOSIS — Z955 Presence of coronary angioplasty implant and graft: Secondary | ICD-10-CM | POA: Diagnosis not present

## 2017-03-25 DIAGNOSIS — I2510 Atherosclerotic heart disease of native coronary artery without angina pectoris: Secondary | ICD-10-CM | POA: Diagnosis not present

## 2017-03-25 DIAGNOSIS — I110 Hypertensive heart disease with heart failure: Secondary | ICD-10-CM | POA: Diagnosis not present

## 2017-03-25 DIAGNOSIS — Z7982 Long term (current) use of aspirin: Secondary | ICD-10-CM | POA: Diagnosis not present

## 2017-03-25 DIAGNOSIS — Z79899 Other long term (current) drug therapy: Secondary | ICD-10-CM | POA: Diagnosis not present

## 2017-03-27 ENCOUNTER — Encounter (HOSPITAL_COMMUNITY)
Admission: RE | Admit: 2017-03-27 | Discharge: 2017-03-27 | Disposition: A | Discharge status: Home / Self Care | Payer: Medicare Other | Source: Ambulatory Visit | Attending: Cardiology | Admitting: Cardiology

## 2017-03-27 ENCOUNTER — Ambulatory Visit (HOSPITAL_COMMUNITY): Payer: Medicare Other

## 2017-03-27 ENCOUNTER — Encounter (HOSPITAL_COMMUNITY): Payer: Medicare Other

## 2017-03-27 DIAGNOSIS — Z955 Presence of coronary angioplasty implant and graft: Secondary | ICD-10-CM

## 2017-03-27 DIAGNOSIS — Z79899 Other long term (current) drug therapy: Secondary | ICD-10-CM | POA: Diagnosis not present

## 2017-03-27 DIAGNOSIS — Z7982 Long term (current) use of aspirin: Secondary | ICD-10-CM | POA: Diagnosis not present

## 2017-03-27 DIAGNOSIS — I2510 Atherosclerotic heart disease of native coronary artery without angina pectoris: Secondary | ICD-10-CM | POA: Diagnosis not present

## 2017-03-27 DIAGNOSIS — I5042 Chronic combined systolic (congestive) and diastolic (congestive) heart failure: Secondary | ICD-10-CM | POA: Diagnosis not present

## 2017-03-27 DIAGNOSIS — I110 Hypertensive heart disease with heart failure: Secondary | ICD-10-CM | POA: Diagnosis not present

## 2017-03-30 ENCOUNTER — Encounter (HOSPITAL_COMMUNITY)
Admission: RE | Admit: 2017-03-30 | Discharge: 2017-03-30 | Disposition: A | Discharge status: Home / Self Care | Payer: Medicare Other | Source: Ambulatory Visit | Attending: Cardiology | Admitting: Cardiology

## 2017-03-30 ENCOUNTER — Encounter (HOSPITAL_COMMUNITY): Payer: Medicare Other

## 2017-03-30 ENCOUNTER — Ambulatory Visit (HOSPITAL_COMMUNITY): Payer: Medicare Other

## 2017-03-30 DIAGNOSIS — I5042 Chronic combined systolic (congestive) and diastolic (congestive) heart failure: Secondary | ICD-10-CM | POA: Diagnosis not present

## 2017-03-30 DIAGNOSIS — I2510 Atherosclerotic heart disease of native coronary artery without angina pectoris: Secondary | ICD-10-CM | POA: Diagnosis not present

## 2017-03-30 DIAGNOSIS — I110 Hypertensive heart disease with heart failure: Secondary | ICD-10-CM | POA: Diagnosis not present

## 2017-03-30 DIAGNOSIS — Z7982 Long term (current) use of aspirin: Secondary | ICD-10-CM | POA: Diagnosis not present

## 2017-03-30 DIAGNOSIS — Z955 Presence of coronary angioplasty implant and graft: Secondary | ICD-10-CM

## 2017-03-30 DIAGNOSIS — Z79899 Other long term (current) drug therapy: Secondary | ICD-10-CM | POA: Diagnosis not present

## 2017-04-01 ENCOUNTER — Ambulatory Visit (HOSPITAL_COMMUNITY): Payer: Medicare Other

## 2017-04-01 ENCOUNTER — Encounter (HOSPITAL_COMMUNITY): Payer: Medicare Other

## 2017-04-01 ENCOUNTER — Encounter (HOSPITAL_COMMUNITY)
Admission: RE | Admit: 2017-04-01 | Discharge: 2017-04-01 | Disposition: A | Discharge status: Home / Self Care | Payer: Medicare Other | Source: Ambulatory Visit | Attending: Cardiology | Admitting: Cardiology

## 2017-04-01 DIAGNOSIS — Z955 Presence of coronary angioplasty implant and graft: Secondary | ICD-10-CM | POA: Diagnosis not present

## 2017-04-01 DIAGNOSIS — I5042 Chronic combined systolic (congestive) and diastolic (congestive) heart failure: Secondary | ICD-10-CM | POA: Diagnosis not present

## 2017-04-01 DIAGNOSIS — I2510 Atherosclerotic heart disease of native coronary artery without angina pectoris: Secondary | ICD-10-CM | POA: Diagnosis not present

## 2017-04-01 DIAGNOSIS — I110 Hypertensive heart disease with heart failure: Secondary | ICD-10-CM | POA: Diagnosis not present

## 2017-04-01 DIAGNOSIS — Z79899 Other long term (current) drug therapy: Secondary | ICD-10-CM | POA: Diagnosis not present

## 2017-04-01 DIAGNOSIS — Z7982 Long term (current) use of aspirin: Secondary | ICD-10-CM | POA: Diagnosis not present

## 2017-04-03 ENCOUNTER — Ambulatory Visit (HOSPITAL_COMMUNITY): Payer: Medicare Other

## 2017-04-03 ENCOUNTER — Encounter (HOSPITAL_COMMUNITY)
Admission: RE | Admit: 2017-04-03 | Discharge: 2017-04-03 | Disposition: A | Discharge status: Home / Self Care | Payer: Medicare Other | Source: Ambulatory Visit | Attending: Cardiology | Admitting: Cardiology

## 2017-04-03 ENCOUNTER — Encounter (HOSPITAL_COMMUNITY): Payer: Medicare Other

## 2017-04-03 DIAGNOSIS — Z79899 Other long term (current) drug therapy: Secondary | ICD-10-CM | POA: Diagnosis not present

## 2017-04-03 DIAGNOSIS — I2510 Atherosclerotic heart disease of native coronary artery without angina pectoris: Secondary | ICD-10-CM | POA: Diagnosis not present

## 2017-04-03 DIAGNOSIS — Z7982 Long term (current) use of aspirin: Secondary | ICD-10-CM | POA: Diagnosis not present

## 2017-04-03 DIAGNOSIS — Z955 Presence of coronary angioplasty implant and graft: Secondary | ICD-10-CM | POA: Diagnosis not present

## 2017-04-03 DIAGNOSIS — I5042 Chronic combined systolic (congestive) and diastolic (congestive) heart failure: Secondary | ICD-10-CM | POA: Diagnosis not present

## 2017-04-03 DIAGNOSIS — I110 Hypertensive heart disease with heart failure: Secondary | ICD-10-CM | POA: Diagnosis not present

## 2017-04-06 ENCOUNTER — Ambulatory Visit (HOSPITAL_COMMUNITY): Payer: Medicare Other

## 2017-04-06 ENCOUNTER — Telehealth: Payer: Self-pay

## 2017-04-06 ENCOUNTER — Encounter (HOSPITAL_COMMUNITY): Payer: Medicare Other

## 2017-04-06 ENCOUNTER — Encounter (HOSPITAL_COMMUNITY)
Admission: RE | Admit: 2017-04-06 | Discharge: 2017-04-06 | Disposition: A | Discharge status: Home / Self Care | Payer: Medicare Other | Source: Ambulatory Visit | Attending: Cardiology | Admitting: Cardiology

## 2017-04-06 DIAGNOSIS — Z79899 Other long term (current) drug therapy: Secondary | ICD-10-CM | POA: Diagnosis not present

## 2017-04-06 DIAGNOSIS — Z955 Presence of coronary angioplasty implant and graft: Secondary | ICD-10-CM

## 2017-04-06 DIAGNOSIS — I5042 Chronic combined systolic (congestive) and diastolic (congestive) heart failure: Secondary | ICD-10-CM | POA: Diagnosis not present

## 2017-04-06 DIAGNOSIS — Z7982 Long term (current) use of aspirin: Secondary | ICD-10-CM | POA: Diagnosis not present

## 2017-04-06 DIAGNOSIS — I110 Hypertensive heart disease with heart failure: Secondary | ICD-10-CM | POA: Diagnosis not present

## 2017-04-06 DIAGNOSIS — I2510 Atherosclerotic heart disease of native coronary artery without angina pectoris: Secondary | ICD-10-CM | POA: Diagnosis not present

## 2017-04-06 NOTE — Telephone Encounter (Signed)
Spoke with cardiac rehab who states that patient presented today stating that he noticed some bleeding at incision site. Upon rehab assessment she reported a closed incision with a scab that was free of redness, swelling, or drainage. I will call the patient and discuss this with him.

## 2017-04-06 NOTE — Progress Notes (Signed)
Pt arrived at cardiac rehab c/o skin tear on right arm. Area cleaned with peroxide, bandaid applied.  Pt also reports oozing from ICD incision line.  No visible blood, eschar present. No redness, edema, tenderness. Slight pink area around incision line.    PC to device clinic to advise. They will contact pt to further assess.

## 2017-04-07 NOTE — Telephone Encounter (Signed)
Follow up   Pt returning phone call from yesterday. Returning phone call from yesterday

## 2017-04-07 NOTE — Telephone Encounter (Signed)
Patient states that he currently has a scab on his incision from where it bled but that he has not had any issues. He states that someone at rehab looked at it felt like he had a stitch. I suggested that we have him come in today or tomorrow for [place] to assess the incision. He was agreeable for 4/25 at 1000 after his rehab appointment.

## 2017-04-08 ENCOUNTER — Telehealth (HOSPITAL_COMMUNITY): Payer: Self-pay | Admitting: Pharmacist

## 2017-04-08 ENCOUNTER — Encounter (HOSPITAL_COMMUNITY)
Admission: RE | Admit: 2017-04-08 | Discharge: 2017-04-08 | Disposition: A | Discharge status: Home / Self Care | Payer: Medicare Other | Source: Ambulatory Visit | Attending: Cardiology | Admitting: Cardiology

## 2017-04-08 ENCOUNTER — Ambulatory Visit (HOSPITAL_COMMUNITY): Payer: Medicare Other

## 2017-04-08 ENCOUNTER — Ambulatory Visit: Payer: Medicare Other

## 2017-04-08 ENCOUNTER — Encounter (HOSPITAL_COMMUNITY): Payer: Medicare Other

## 2017-04-08 DIAGNOSIS — Z955 Presence of coronary angioplasty implant and graft: Secondary | ICD-10-CM | POA: Diagnosis not present

## 2017-04-08 DIAGNOSIS — I110 Hypertensive heart disease with heart failure: Secondary | ICD-10-CM | POA: Diagnosis not present

## 2017-04-08 DIAGNOSIS — I5042 Chronic combined systolic (congestive) and diastolic (congestive) heart failure: Secondary | ICD-10-CM | POA: Diagnosis not present

## 2017-04-08 DIAGNOSIS — Z7982 Long term (current) use of aspirin: Secondary | ICD-10-CM | POA: Diagnosis not present

## 2017-04-08 DIAGNOSIS — I2510 Atherosclerotic heart disease of native coronary artery without angina pectoris: Secondary | ICD-10-CM | POA: Diagnosis not present

## 2017-04-08 DIAGNOSIS — Z79899 Other long term (current) drug therapy: Secondary | ICD-10-CM | POA: Diagnosis not present

## 2017-04-08 NOTE — Telephone Encounter (Signed)
Wayne [name] called stating that he had some bleeding from his ICD incision site. He stated that it was several days ago and it has not bled since. He was at Dr. Tanna Furry office this morning as they were trying to get him in to check his incision site but he was not able to stay. I have advised him to call them for an appointment if this occurs again and to let us know if he has any other s/s bleeding. He verbalized understanding and was grateful for the advice.   Ruta Hinds. Velva Harman, PharmD, BCPS, CPP Clinical Pharmacist Pager: [phone] Phone: [phone] 04/08/2017 12:20 PM

## 2017-04-08 NOTE — Progress Notes (Signed)
Cardiac Individual Treatment Plan  Patient Details  Name: Wayne [name] MRN: [id number] Date of Birth: Dec 31, 1940 Referring Provider:     [facility] from 12/16/2016 in [place]  Referring Provider  Loralie Champagne, MD      Initial Encounter Date:    CARDIAC REHAB PHASE II ORIENTATION from 12/16/2016 in [facility]  Date  12/16/16  Referring Provider  Loralie Champagne, MD      Visit Diagnosis: 11/17/16 S/P coronary artery stent placement  Patient's Home Medications on Admission:  Current Outpatient Prescriptions:  .  acetaminophen (TYLENOL) 500 MG tablet, Take 500 mg by mouth daily as needed for moderate pain or headache., Disp: , Rfl:  .  amiodarone (PACERONE) 200 MG tablet, 200 mg BID twice daily until 02/18/17, Then take 200 mg daily., Disp: 40 tablet, Rfl: 6 .  carvedilol (COREG) 6.25 MG tablet, Take 1.5 tablets (9.375 mg total) by mouth 2 (two) times daily., Disp: 90 tablet, Rfl: 3 .  digoxin (LANOXIN) 0.125 MG tablet, Take 0.5 tablets (0.0625 mg total) by mouth daily., Disp: 15 tablet, Rfl: 6 .  diphenhydramine-acetaminophen (TYLENOL PM) 25-500 MG TABS tablet, Take 1 tablet by mouth at bedtime as needed (sleep)., Disp: , Rfl:  .  Evolocumab (REPATHA SURECLICK) 599 MG/ML SOAJ, Inject 1 pen into the skin every 14 (fourteen) days., Disp: , Rfl:  .  furosemide (LASIX) 20 MG tablet, Take 4 tablets (80 mg total) by mouth daily., Disp: 120 tablet, Rfl: 6 .  glimepiride (AMARYL) 2 MG tablet, TAKE 1 TABLET(2 MG) BY MOUTH DAILY BEFORE BREAKFAST, Disp: 90 tablet, Rfl: 1 .  Lancets (ACCU-CHEK SOFT TOUCH) lancets, Check blood sugars 1-2 times per day. DX: E11.65, Disp: 200 each, Rfl: 5 .  losartan (COZAAR) 25 MG tablet, Take 1 tablet (25 mg total) by mouth 2 (two) times daily., Disp: 60 tablet, Rfl: 6 .  Multiple Vitamins-Minerals (CENTRUM SILVER ADULT 50+) TABS, Take 1 tablet by mouth daily., Disp: , Rfl:  .   pravastatin (PRAVACHOL) 20 MG tablet, Take 1 tablet (20 mg total) by mouth every evening., Disp: 30 tablet, Rfl: 2 .  Rivaroxaban (XARELTO) 15 MG TABS tablet, Take 1 tablet (15 mg total) by mouth daily., Disp: 90 tablet, Rfl: 3 .  spironolactone (ALDACTONE) 25 MG tablet, Take 0.5 tablets (12.5 mg total) by mouth daily., Disp: 15 tablet, Rfl: 6 .  ticagrelor (BRILINTA) 60 MG TABS tablet, Take 1 tablet (60 mg total) by mouth 2 (two) times daily., Disp: 60 tablet, Rfl: 6 .  traMADol (ULTRAM) 50 MG tablet, Take 1 tablet (50 mg total) by mouth every 6 (six) hours as needed for moderate pain (at device insertion site). (Patient not taking: Reported on 03/09/2017), Disp: 30 tablet, Rfl: 0  Past Medical History: Past Medical History:  Diagnosis Date  . Adenocarcinoma of prostate ([place])    s/p seed implants  . Arthritis   . Cellulitis of left leg    a. 02/5700 complicated by septic shock  . Chronic combined systolic and diastolic CHF (congestive heart failure) ([place])   . Colon polyps   . CORONARY ATHEROSCLEROSIS NATIVE CORONARY ARTERY    a. 01/2011 Cath/PCI: LM nl, LAD 40-50p, D1 80-small, LCX 95-small, RI 90, RCA 100, EF 20%;  b. 01/2011 Card MRI - No transmural scar;  c. 01/2011 PCI RCA->5 Promus DES, RI->3.0x16 Promus DES; d. Cath 01/13/13 patent LAD & Ramus, diffuse LCx dz, RCA mult overlapping stents w/ 95%  osital stenosis, EF 20%, s/p DES to ostial/prox RCA 01/24/13   . Diabetes mellitus, type 2 ([place])   . GERD (gastroesophageal reflux disease)   . Hematoma of leg    a. left leg hematoma 03/2012 in the setting of asa/effient  . Herpes zoster ophthalmicus   . HYPERLIPIDEMIA    intolerant to Lipitor (myalgias)  . HYPERTENSION   . Ischemic cardiomyopathy    a. 01/2012 Echo EF 45%, mild LVH; b. AQ76%, grade 1 diastolic dysfunction, diffuse hypokinesis, inferoposterior akinesis   . Noncompliance   . Obesity   . OSA (obstructive sleep apnea)   . Polymyalgia rheumatica (H[facility])     Tobacco Use: History   Smoking Status  . Former Smoker  . Packs/day: 0.30  . Years: 20.00  . Types: Cigarettes  . Quit date: 02/23/1989  Smokeless Tobacco  . Never Used    Labs: Recent Review Flowsheet Data    Labs for ITP Cardiac and Pulmonary Rehab Latest Ref Rng & Units 07/14/2016 10/06/2016 11/14/2016 11/14/2016 12/23/2016   Cholestrol 0 - 200 mg/dL - 146 - - 189   LDLCALC 0 - 99 mg/dL - 87 - - 104(H)   LDLDIRECT mg/dL - - - - -   HDL >40 mg/dL - 32(L) - - 33(L)   Trlycerides <150 mg/dL - 135 - - 259(H)   Hemoglobin A1c 4.6 - 6.5 % 8.7(H) - - - -   PHART 7.350 - 7.450 - - - - -   PCO2ART 35.0 - 45.0 mmHg - - - - -   HCO3 20.0 - 28.0 mmol/L - - 24.6 24.6 -   TCO2 0 - 100 mmol/L - - 26 26 -   ACIDBASEDEF 0.0 - 2.0 mmol/L - - 1.0 1.0 -   O2SAT % - - 57.0 57.0 -      Capillary Blood Glucose: Lab Results  Component Value Date   GLUCAP 292 (H) 03/18/2017   GLUCAP 222 (H) 02/13/2017   GLUCAP 233 (H) 02/12/2017   GLUCAP 279 (H) 02/12/2017   GLUCAP 297 (H) 02/12/2017     Exercise Target Goals:    Exercise Program Goal: Individual exercise prescription set with THRR, safety & activity barriers. Participant demonstrates ability to understand and report RPE using BORG scale, to self-measure pulse accurately, and to acknowledge the importance of the exercise prescription.  Exercise Prescription Goal: Starting with aerobic activity 30 plus minutes a day, 3 days per week for initial exercise prescription. Provide home exercise prescription and guidelines that participant acknowledges understanding prior to discharge.  Activity Barriers & Risk Stratification:     Activity Barriers & Cardiac Risk Stratification - 12/16/16 1616      Activity Barriers & Cardiac Risk Stratification   Cardiac Risk Stratification High      6 Minute Walk:     6 Minute Walk    Row Name 12/16/16 1616         6 Minute Walk   Phase Initial     Distance 1175 feet     Walk Time 6 minutes     # of Rest Breaks 0      MPH 2.2     METS 2.35     RPE 13     VO2 Peak 8.24     Symptoms Yes (comment)     Comments 5/10 L hip/knee pain     Resting HR 98 bpm     Resting BP 108/64     Max Ex. HR 124 bpm  Max Ex. BP 134/62     2 Minute Post BP 106/60        Oxygen Initial Assessment:   Oxygen Re-Evaluation:   Oxygen Discharge (Final Oxygen Re-Evaluation):   Initial Exercise Prescription:     Initial Exercise Prescription - 12/16/16 1600      Date of Initial Exercise RX and Referring Provider   Date 12/16/16   Referring Provider Loralie Champagne, MD     Recumbant Bike   Level 2   Minutes 10   METs 1.2     NuStep   Level 2   Minutes 10   METs 1.5     Track   Laps 8   Minutes 10   METs 2.39     Prescription Details   Frequency (times per week) 3   Duration Progress to 30 minutes of continuous aerobic without signs/symptoms of physical distress     Intensity   THRR 40-80% of Max Heartrate 59-118   Ratings of Perceived Exertion 11-13   Perceived Dyspnea 0-4     Progression   Progression Continue progressive overload as per policy without signs/symptoms or physical distress.     Resistance Training   Training Prescription Yes   Weight 2lbs   Reps 10-12      Perform Capillary Blood Glucose checks as needed.  Exercise Prescription Changes:     Exercise Prescription Changes    Row Name 01/13/17 1500 02/10/17 1200 03/09/17 1600 03/24/17 1500 04/06/17 1600     Response to Exercise   Blood Pressure (Admit) 118/62 110/60 116/60 115/52 122/60   Blood Pressure (Exercise) 120/62 128/60 134/64 120/60 124/62   Blood Pressure (Exit) 104/60 110/60 104/60 115/65 108/62   Heart Rate (Admit) 97 bpm 77 bpm 74 bpm 80 bpm 75 bpm   Heart Rate (Exercise) 105 bpm 105 bpm 88 bpm 90 bpm 87 bpm   Heart Rate (Exit) 96 bpm 72 bpm 71 bpm 80 bpm 75 bpm   Rating of Perceived Exertion (Exercise) '12 13 12 12 12   ' Symptoms none none none none none   Comments Reviewed HEP on 01/07/17 Reviewed HEP on  01/07/17 Reviewed HEP on 01/07/17 Reviewed HEP on 01/07/17 Reviewed HEP on 01/07/17   Duration Progress to 30 minutes of continuous aerobic without signs/symptoms of physical distress Progress to 30 minutes of  aerobic without signs/symptoms of physical distress Progress to 30 minutes of  aerobic without signs/symptoms of physical distress Progress to 30 minutes of  aerobic without signs/symptoms of physical distress Progress to 30 minutes of  aerobic without signs/symptoms of physical distress   Intensity THRR unchanged THRR unchanged THRR unchanged THRR unchanged THRR unchanged     Progression   Average METs 2.4 2.4 2.3 2.5 2.8     Resistance Training   Training Prescription Yes Yes Yes Yes Yes   Weight 5lbs 5lbs 5lbs 5lbs 6lbs   Reps 10-12 10-15 10-15 10-15 10-15   Time  - 10 Minutes 10 Minutes 10 Minutes 10 Minutes     Recumbant Bike   Level '2 3 3 3 ' 3.5   Minutes '10 10 10 10 10   ' METs 2.1 2.3 2.2 2.4 3     NuStep   Level '3 4 4 4 5   ' Minutes '10 10 10 10 10   ' METs 2.7 2.7 2.2 2.8 2.6     Arm Ergometer   Level '1 1 1 1 1   ' Watts '25 25 25 25 25   ' Minutes 10 10  '10 10 10   ' METs 2.25 2.27 2.3 2.3 3     Home Exercise Plan   Plans to continue exercise at [place] on 01/07/17 see progress note Home (comment)  Reviewed HEP on 01/07/17 see progress note Home (comment)  Reviewed HEP on 01/07/17 see progress note Home (comment)  Reviewed HEP on 01/07/17 see progress note Home (comment)  Reviewed HEP on 01/07/17 see progress note   Frequency Add 2 additional days to program exercise sessions. Add 2 additional days to program exercise sessions. Add 2 additional days to program exercise sessions. Add 2 additional days to program exercise sessions. Add 2 additional days to program exercise sessions.   Initial Home Exercises Provided  - 01/07/17 01/07/17 01/07/17 01/07/17     Exercise Review   Progression Yes Yes Yes Yes Yes      Exercise Comments:     Exercise Comments    Row Name  01/13/17 1534 02/09/17 1105 04/07/17 1340       Exercise Comments Reviewed METs and goals. Pt is tolerating exercise well; will continue to monitor exercise progression. Reviewed METs and goals. Pt is tolerating exercise well; will continue to monitor exercise progression. Reviewed METs and goals. Pt is tolerating exercise well; will continue to monitor exercise progression.        Exercise Goals and Review:     Exercise Goals    Row Name 02/09/17 1106             Exercise Goals   Increase Physical Activity Yes       Intervention Provide advice, education, support and counseling about physical activity/exercise needs.;Develop an individualized exercise prescription for aerobic and resistive training based on initial evaluation findings, risk stratification, comorbidities and participant's personal goals.       Expected Outcomes Achievement of increased cardiorespiratory fitness and enhanced flexibility, muscular endurance and strength shown through measurements of functional capacity and personal statement of participant.       Increase Strength and Stamina Yes       Intervention Provide advice, education, support and counseling about physical activity/exercise needs.;Develop an individualized exercise prescription for aerobic and resistive training based on initial evaluation findings, risk stratification, comorbidities and participant's personal goals.       Expected Outcomes Achievement of increased cardiorespiratory fitness and enhanced flexibility, muscular endurance and strength shown through measurements of functional capacity and personal statement of participant.          Exercise Goals Re-Evaluation :     Exercise Goals Re-Evaluation    Row Name 02/10/17 1455 02/10/17 1456 04/07/17 1338         Exercise Goal Re-Evaluation   Exercise Goals Review Increase Physical Activity;Increase Strenth and Stamina  - Increase Physical Activity;Increase Strenth and Stamina      Comments Pt is increasing in actvity levels and has improved MET levels Pt is increasing in actvity levels and has increased workload on Nustep machine. Pt is tolerating WL increases very well. Pt stated " balance is improving and feeling stronger"      Expected Outcomes  - Pt will continue to improve in cardiorespiratory fitness Pt will continue to improve in cardiorespiratory fitness         Discharge Exercise Prescription (Final Exercise Prescription Changes):     Exercise Prescription Changes - 04/06/17 1600      Response to Exercise   Blood Pressure (Admit) 122/60   Blood Pressure (Exercise) 124/62   Blood Pressure (Exit) 108/62   Heart  Rate (Admit) 75 bpm   Heart Rate (Exercise) 87 bpm   Heart Rate (Exit) 75 bpm   Rating of Perceived Exertion (Exercise) 12   Symptoms none   Comments Reviewed HEP on 01/07/17   Duration Progress to 30 minutes of  aerobic without signs/symptoms of physical distress   Intensity THRR unchanged     Progression   Average METs 2.8     Resistance Training   Training Prescription Yes   Weight 6lbs   Reps 10-15   Time 10 Minutes     Recumbant Bike   Level 3.5   Minutes 10   METs 3     NuStep   Level 5   Minutes 10   METs 2.6     Arm Ergometer   Level 1   Watts 25   Minutes 10   METs 3     Home Exercise Plan   Plans to continue exercise at Home (comment)  Reviewed HEP on 01/07/17 see progress note   Frequency Add 2 additional days to program exercise sessions.   Initial Home Exercises Provided 01/07/17     Exercise Review   Progression Yes      Nutrition:  Target Goals: Understanding of nutrition guidelines, daily intake of sodium <1526m, cholesterol <2037m calories 30% from fat and 7% or less from saturated fats, daily to have 5 or more servings of fruits and vegetables.  Biometrics:     Pre Biometrics - 12/16/16 1619      Pre Biometrics   Height 5' 7.5" (1.715 m)   Weight 225 lb 15.5 oz (102.5 kg)   Waist  Circumference 46 inches   Hip Circumference 46 inches   Waist to Hip Ratio 1 %   BMI (Calculated) 34.9   Triceps Skinfold 12 mm   % Body Fat 32.4 %   Grip Strength 42 kg   Flexibility 18 in   Single Leg Stand 18 seconds       Nutrition Therapy Plan and Nutrition Goals:     Nutrition Therapy & Goals - 01/16/17 1215      Nutrition Therapy   Diet Carb Modified, Therapeutic Lifestyle Changes     Personal Nutrition Goals   Nutrition Goal Wt loss of 1-2 lb/week to a wt loss goal of 6-24 lb at graduation from [facility] Improved glycemic control as evidenced by a decrease in A1c from 8.7 to a goal of < 7%.     Intervention Plan   Intervention Prescribe, educate and counsel regarding individualized specific dietary modifications aiming towards targeted core components such as weight, hypertension, lipid management, diabetes, heart failure and other comorbidities.   Expected Outcomes Short Term Goal: Understand basic principles of dietary content, such as calories, fat, sodium, cholesterol and nutrients.;Long Term Goal: Adherence to prescribed nutrition plan.      Nutrition Discharge: Nutrition Scores:     Nutrition Assessments - 01/16/17 1215      MEDFICTS Scores   Pre Score 44      Nutrition Goals Re-Evaluation:   Nutrition Goals Re-Evaluation:   Nutrition Goals Discharge (Final Nutrition Goals Re-Evaluation):   Psychosocial: Target Goals: Acknowledge presence or absence of significant depression and/or stress, maximize coping skills, provide positive support system. Participant is able to verbalize types and ability to use techniques and skills needed for reducing stress and depression.  Initial Review & Psychosocial Screening:     Initial Psych Review & Screening - 12/18/16 0935  Family Dynamics   Good Support System? Yes   Comments A brief assesment reveals no further psychosocial needed at this time.     Barriers   Psychosocial  barriers to participate in program There are no identifiable barriers or psychosocial needs.     Screening Interventions   Interventions Encouraged to exercise      Quality of Life Scores:     Quality of Life - 12/16/16 1357      Quality of Life Scores   Health/Function Pre 17.23 %   Socioeconomic Pre 21.5 %   Psych/Spiritual Pre 27 %   Family Pre 20.63 %   GLOBAL Pre 20.5 %      PHQ-9: Recent Review Flowsheet Data    Depression screen [facility] 2/9 03/09/2017 12/24/2016 09/30/2016   Decreased Interest 0 0 0   Down, Depressed, Hopeless 0 0 0   PHQ - 2 Score 0 0 0     Interpretation of Total Score  Total Score Depression Severity:  1-4 = Minimal depression, 5-9 = Mild depression, 10-14 = Moderate depression, 15-19 = Moderately severe depression, 20-27 = Severe depression   Psychosocial Evaluation and Intervention:     Psychosocial Evaluation - 03/09/17 1050      Psychosocial Evaluation & Interventions   Interventions Encouraged to exercise with the program and follow exercise prescription;Stress management education;Relaxation education   Continue Psychosocial Services  Follow up required by staff      Psychosocial Re-Evaluation:     Psychosocial Re-Evaluation    [place] Name 01/15/17 1542 02/11/17 1856 03/09/17 1051 04/03/17 1512       Psychosocial Re-Evaluation   Current issues with  - Current Anxiety/Panic Current Anxiety/Panic Current Anxiety/Panic    Comments Pt with good support system at home. Pt with good support system at home. pt is encouraged he is able to return to cardiac rehab. pt reports he feels better since his ICD implant.  pt has good support system at home. pt reports he feels better since his ICD implant.    pt is discouraged he is unable to drive.  however he is grateful for medical care provided and understands necessity of this limitation. pt states he looks for positive in situations to avoid depression. 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  pt has good support system at home.      Expected Outcomes  -  - pt will exhibit good coping skills with positive, hopeful outlook.  pt will exhibit good coping skills with positive, hopeful outlook.    Interventions Encouraged to attend Cardiac Rehabilitation for the exercise;Relaxation education;Stress management education Stress management education;Relaxation education;Encouraged to attend Cardiac Rehabilitation for the exercise Stress management education;Relaxation education;Encouraged to attend Cardiac Rehabilitation for the exercise Stress management education;Relaxation education;Encouraged to attend Cardiac Rehabilitation for the exercise    Continue Psychosocial Services   -  - Follow up required by staff Follow up required by staff       Psychosocial Discharge (Final Psychosocial Re-Evaluation):     Psychosocial Re-Evaluation - 04/03/17 1512      Psychosocial Re-Evaluation   Current issues with Current Anxiety/Panic   Comments pt reports he feels better since his ICD implant.    pt is discouraged he is unable to drive.  however he is grateful for medical care provided and understands necessity of this limitation. pt states he looks for positive in situations to avoid depression. 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  pt  has good support system at home.     Expected Outcomes pt will exhibit good coping skills with positive, hopeful outlook.   Interventions Stress management education;Relaxation education;Encouraged to attend Cardiac Rehabilitation for the exercise   Continue Psychosocial Services  Follow up required by staff      Vocational Rehabilitation: Provide vocational rehab assistance to qualifying candidates.   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 Participant will state understanding/return demonstration of topics presented.  Learning Barriers/Preferences:     Learning Barriers/Preferences - 12/16/16 1455      Learning Barriers/Preferences   Learning Barriers Sight   Learning Preferences Verbal Instruction;Written Material;Skilled Demonstration;Individual Instruction;Group Instruction      Education Topics: Count Your Pulse:  -Group instruction provided by verbal instruction, demonstration, patient participation and written materials to support subject.  Instructors address importance of being able to find your pulse and how to count your pulse when at home without a heart monitor.  Patients get hands on experience counting their pulse with staff help and individually.   Heart Attack, Angina, and Risk Factor Modification:  -Group instruction provided by verbal instruction, video, and written materials to support subject.  Instructors address signs and symptoms of angina and heart attacks.    Also discuss risk factors for heart disease and how to make changes to improve heart health risk factors.   CARDIAC REHAB PHASE II EXERCISE from 04/03/2017 in [place]  Date  03/11/17  Instruction Review Code  2- meets goals/outcomes      Functional Fitness:  -Group instruction provided by verbal instruction, demonstration, patient participation, and written materials to support subject.  Instructors address safety measures for doing things around the house.  Discuss how to get up and down off the floor, how to pick things up properly, how to safely get out of a chair without assistance, and balance training.   CARDIAC REHAB PHASE II EXERCISE from 04/03/2017 in [place]  Date  04/03/17  Instruction Review Code  2- meets goals/outcomes       Meditation and Mindfulness:  -Group instruction provided by verbal instruction, patient participation, and written materials to support subject.  Instructor addresses importance of mindfulness and meditation practice to help reduce stress and improve awareness.  Instructor also leads participants through a meditation exercise.    CARDIAC REHAB PHASE II EXERCISE from 04/03/2017 in [place]  Date  04/01/17  Educator  [place]  Instruction Review Code  2- meets goals/outcomes      Stretching for Flexibility and Mobility:  -Group instruction provided by verbal instruction, patient participation, and written materials to support subject.  Instructors lead participants through series of stretches that are designed to increase flexibility thus improving mobility.  These stretches are additional exercise for major muscle groups that are typically performed during regular warm up and cool down.   CARDIAC REHAB PHASE II EXERCISE from 04/03/2017 in [place]  Date  01/09/17  Instruction Review Code  2- meets goals/outcomes      Hands Only CPR Anytime:  -Group instruction provided by verbal instruction, video, patient participation  and written materials to support subject.  Instructors co-teach with [facility] video for hands only CPR.  Participants get hands on experience with mannequins.   Nutrition I class: Heart Healthy Eating:  -Group instruction provided by PowerPoint slides, verbal discussion, and written materials to support subject matter. The instructor gives an explanation and review of the Therapeutic Lifestyle Changes diet recommendations, which includes a discussion on lipid goals, dietary fat, sodium, fiber, plant stanol/sterol esters, sugar, and the components of a well-balanced, healthy diet.   Nutrition II class: Lifestyle Skills:  -Group instruction provided by PowerPoint slides, verbal discussion, and written  materials to support subject matter. The instructor gives an explanation and review of label reading, grocery shopping for heart health, heart healthy recipe modifications, and ways to make healthier choices when eating out.   Diabetes Question & Answer:  -Group instruction provided by PowerPoint slides, verbal discussion, and written materials to support subject matter. The instructor gives an explanation and review of diabetes co-morbidities, pre- and post-prandial blood glucose goals, pre-exercise blood glucose goals, signs, symptoms, and treatment of hypoglycemia and hyperglycemia, and foot care basics.   CARDIAC REHAB PHASE II EXERCISE from 04/03/2017 in [place]  Date  01/23/17  Educator  [place]  Instruction Review Code  2- meets goals/outcomes      Diabetes Blitz:  -Group instruction provided by PowerPoint slides, verbal discussion, and written materials to support subject matter. The instructor gives an explanation and review of the physiology behind type 1 and type 2 diabetes, diabetes medications and rational behind using different medications, pre- and post-prandial blood glucose recommendations and Hemoglobin A1c goals, diabetes diet, and exercise including blood glucose guidelines for exercising safely.    Portion Distortion:  -Group instruction provided by PowerPoint slides, verbal discussion, written materials, and food models to support subject matter. The instructor gives an explanation of serving size versus portion size, changes in portions sizes over the last 20 years, and what consists of a serving from each food group.   Stress Management:  -Group instruction provided by verbal instruction, video, and written materials to support subject matter.  Instructors review role of stress in heart disease and how to cope with stress positively.     CARDIAC REHAB PHASE II EXERCISE from 04/03/2017 in [place]  Date   01/02/17  Instruction Review Code  2- meets goals/outcomes      Exercising on Your Own:  -Group instruction provided by verbal instruction, power point, and written materials to support subject.  Instructors discuss benefits of exercise, components of exercise, frequency and intensity of exercise, and end points for exercise.  Also discuss use of nitroglycerin and activating EMS.  Review options of places to exercise outside of rehab.  Review guidelines for sex with heart disease.   CARDIAC REHAB PHASE II EXERCISE from 04/03/2017 in [place]  Date  01/14/17  Educator  [place] EP  Instruction Review Code  2- meets goals/outcomes      Cardiac Drugs I:  -Group instruction provided by verbal instruction and written materials to support subject.  Instructor reviews cardiac drug classes: antiplatelets, anticoagulants, beta blockers, and statins.  Instructor discusses reasons, side effects, and lifestyle considerations for each drug class.   CARDIAC REHAB PHASE II EXERCISE from 04/03/2017 in [place]  Date  03/25/17  Educator  Kennyth Lose  Instruction Review Code  2- meets goals/outcomes      Cardiac Drugs  II:  -Group instruction provided by verbal instruction and written materials to support subject.  Instructor reviews cardiac drug classes: angiotensin converting enzyme inhibitors (ACE-I), angiotensin II receptor blockers (ARBs), nitrates, and calcium channel blockers.  Instructor discusses reasons, side effects, and lifestyle considerations for each drug class.   CARDIAC REHAB PHASE II EXERCISE from 04/03/2017 in [place]  Date  12/24/16  Educator  Pharmacist  Instruction Review Code  2- meets goals/outcomes      Anatomy and Physiology of the Circulatory System:  -Group instruction provided by verbal instruction, video, and written materials to support subject.  Reviews functional anatomy of  heart, how it relates to various diagnoses, and what role the heart plays in the overall system.   CARDIAC REHAB PHASE II EXERCISE from 04/03/2017 in [place]  Date  03/18/17  Instruction Review Code  2- meets goals/outcomes      Knowledge Questionnaire Score:     Knowledge Questionnaire Score - 12/16/16 1357      Knowledge Questionnaire Score   Pre Score 19/24      Core Components/Risk Factors/Patient Goals at Admission:     Personal Goals and Risk Factors at Admission - 12/16/16 1458      Core Components/Risk Factors/Patient Goals on Admission    Weight Management Yes;Weight Loss;Obesity;Weight Maintenance   Intervention Weight Management: Develop a combined nutrition and exercise program designed to reach desired caloric intake, while maintaining appropriate intake of nutrient and fiber, sodium and fats, and appropriate energy expenditure required for the weight goal.;Weight Management: Provide education and appropriate resources to help participant work on and attain dietary goals.;Weight Management/Obesity: Establish reasonable short term and long term weight goals.;Obesity: Provide education and appropriate resources to help participant work on and attain dietary goals.   Expected Outcomes Short Term: Continue to assess and modify interventions until short term weight is achieved;Long Term: Adherence to nutrition and physical activity/exercise program aimed toward attainment of established weight goal;Weight Maintenance: Understanding of the daily nutrition guidelines, which includes 25-35% calories from fat, 7% or less cal from saturated fats, less than 235m cholesterol, less than 1.5gm of sodium, & 5 or more servings of fruits and vegetables daily;Weight Loss: Understanding of general recommendations for a balanced deficit meal plan, which promotes 1-2 lb weight loss per week and includes a negative energy balance of [id number] kcal/d;Understanding  recommendations for meals to include 15-35% energy as protein, 25-35% energy from fat, 35-60% energy from carbohydrates, less than 2044mof dietary cholesterol, 20-35 gm of total fiber daily;Understanding of distribution of calorie intake throughout the day with the consumption of 4-5 meals/snacks   Sedentary Yes   Intervention Provide advice, education, support and counseling about physical activity/exercise needs.;Develop an individualized exercise prescription for aerobic and resistive training based on initial evaluation findings, risk stratification, comorbidities and participant's personal goals.   Expected Outcomes Achievement of increased cardiorespiratory fitness and enhanced flexibility, muscular endurance and strength shown through measurements of functional capacity and personal statement of participant.   Increase Strength and Stamina Yes   Intervention Provide advice, education, support and counseling about physical activity/exercise needs.;Develop an individualized exercise prescription for aerobic and resistive training based on initial evaluation findings, risk stratification, comorbidities and participant's personal goals.   Expected Outcomes Achievement of increased cardiorespiratory fitness and enhanced flexibility, muscular endurance and strength shown through measurements of functional capacity and personal statement of participant.   Improve shortness of breath with ADL's Yes   Intervention Provide education,  individualized exercise plan and daily activity instruction to help decrease symptoms of SOB with activities of daily living.   Expected Outcomes Short Term: Achieves a reduction of symptoms when performing activities of daily living.   Diabetes Yes   Intervention Provide education about signs/symptoms and action to take for hypo/hyperglycemia.;Provide education about proper nutrition, including hydration, and aerobic/resistive exercise prescription along with prescribed  medications to achieve blood glucose in normal ranges: Fasting glucose 65-99 mg/dL   Expected Outcomes Short Term: Participant verbalizes understanding of the signs/symptoms and immediate care of hyper/hypoglycemia, proper foot care and importance of medication, aerobic/resistive exercise and nutrition plan for blood glucose control.;Long Term: Attainment of HbA1C < 7%.   Hypertension Yes   Intervention Provide education on lifestyle modifcations including regular physical activity/exercise, weight management, moderate sodium restriction and increased consumption of fresh fruit, vegetables, and low fat dairy, alcohol moderation, and smoking cessation.;Monitor prescription use compliance.   Expected Outcomes Short Term: Continued assessment and intervention until BP is < 140/72m HG in hypertensive participants. < 130/815mHG in hypertensive participants with diabetes, heart failure or chronic kidney disease.;Long Term: Maintenance of blood pressure at goal levels.   Lipids Yes   Intervention Provide education and support for participant on nutrition & aerobic/resistive exercise along with prescribed medications to achieve LDL <7072mHDL >85m5m Expected Outcomes Short Term: Participant states understanding of desired cholesterol values and is compliant with medications prescribed. Participant is following exercise prescription and nutrition guidelines.;Long Term: Cholesterol controlled with medications as prescribed, with individualized exercise RX and with personalized nutrition plan. Value goals: LDL < 70mg61mL > 40 mg.   Personal Goal Other Yes   Personal Goal Improve walking tolerance and overall CV health.   Intervention Provide exercise programming to assist with improving exercise capacity and provide cardiac education classes to increase awareness and knowledge   Expected Outcomes Pt will be able to walk with little to no symptoms of leg pain. Pt will improve overall quality of life       Core Components/Risk Factors/Patient Goals Review:      Goals and Risk Factor Review    Row Name 01/13/17 1535 03/10/17 1659 04/03/17 1510 04/07/17 0713       Core Components/Risk Factors/Patient Goals Review   Personal Goals Review Other Weight Management/Obesity;Improve shortness of breath with ADL's;Heart Failure;Lipids;Hypertension;Diabetes;Other Weight Management/Obesity;Improve shortness of breath with ADL's;Heart Failure;Lipids;Hypertension;Diabetes;Other  -    Review Pt is walking 2x/week at home for exercise without difficulty pt returned to [facility] after extended absence for ICD placement.  pt reports he feels better and is eager to resume exercise activities.  pt is tolerating exercise without difficulty. pt with improved skin tone and color.  pt reports increased strength, stamina and energy.  pt dyspnea and fatigue are improving.         Expected Outcomes Pt will improve in exercise tolerance and overall fitness levels. Pt will improve in exercise tolerance and overall fitness levels with decreased dyspnea and fatigue.  Pt will improve in exercise tolerance and overall fitness levels with decreased dyspnea and fatigue.   -       Core Components/Risk Factors/Patient Goals at Discharge (Final Review):      Goals and Risk Factor Review - 04/07/17 0713      Core Components/Risk Factors/Patient Goals Review   Review         ITP Comments:     ITP Comments    Row Name 12/16/16 1339 01/16/17 1105 03/27/17 0825 03/27/17 [id number]  ITP Comments Dr Sherle Poe, Medical Director Patient attended Hypertension Class on 01/16/17 03/27/17, HTN, meets goals (P)  03/27/17, HTN, meets goals       Comments: Pt is making better than expected progress toward personal goals after completing 32 sessions. Recommend continued exercise and life style modification education including  stress management and relaxation techniques to decrease cardiac risk profile.

## 2017-04-10 ENCOUNTER — Encounter (HOSPITAL_COMMUNITY)
Admission: RE | Admit: 2017-04-10 | Discharge: 2017-04-10 | Disposition: A | Discharge status: Home / Self Care | Payer: Medicare Other | Source: Ambulatory Visit | Attending: Cardiology | Admitting: Cardiology

## 2017-04-10 ENCOUNTER — Ambulatory Visit (HOSPITAL_COMMUNITY): Payer: Medicare Other

## 2017-04-10 ENCOUNTER — Encounter (HOSPITAL_COMMUNITY): Payer: Medicare Other

## 2017-04-10 DIAGNOSIS — I2510 Atherosclerotic heart disease of native coronary artery without angina pectoris: Secondary | ICD-10-CM | POA: Diagnosis not present

## 2017-04-10 DIAGNOSIS — Z955 Presence of coronary angioplasty implant and graft: Secondary | ICD-10-CM

## 2017-04-10 DIAGNOSIS — Z7982 Long term (current) use of aspirin: Secondary | ICD-10-CM | POA: Diagnosis not present

## 2017-04-10 DIAGNOSIS — Z79899 Other long term (current) drug therapy: Secondary | ICD-10-CM | POA: Diagnosis not present

## 2017-04-10 DIAGNOSIS — I110 Hypertensive heart disease with heart failure: Secondary | ICD-10-CM | POA: Diagnosis not present

## 2017-04-10 DIAGNOSIS — I5042 Chronic combined systolic (congestive) and diastolic (congestive) heart failure: Secondary | ICD-10-CM | POA: Diagnosis not present

## 2017-04-13 ENCOUNTER — Encounter (HOSPITAL_COMMUNITY): Payer: Medicare Other

## 2017-04-13 ENCOUNTER — Encounter (HOSPITAL_COMMUNITY): Payer: Self-pay

## 2017-04-13 ENCOUNTER — Ambulatory Visit (HOSPITAL_COMMUNITY): Payer: Medicare Other

## 2017-04-13 ENCOUNTER — Encounter (HOSPITAL_COMMUNITY)
Admission: RE | Admit: 2017-04-13 | Discharge: 2017-04-13 | Disposition: A | Discharge status: Home / Self Care | Payer: Medicare Other | Source: Ambulatory Visit | Attending: Cardiology | Admitting: Cardiology

## 2017-04-13 DIAGNOSIS — Z955 Presence of coronary angioplasty implant and graft: Secondary | ICD-10-CM

## 2017-04-13 DIAGNOSIS — Z79899 Other long term (current) drug therapy: Secondary | ICD-10-CM | POA: Diagnosis not present

## 2017-04-13 DIAGNOSIS — Z7982 Long term (current) use of aspirin: Secondary | ICD-10-CM | POA: Diagnosis not present

## 2017-04-13 DIAGNOSIS — I110 Hypertensive heart disease with heart failure: Secondary | ICD-10-CM | POA: Diagnosis not present

## 2017-04-13 DIAGNOSIS — I2510 Atherosclerotic heart disease of native coronary artery without angina pectoris: Secondary | ICD-10-CM | POA: Diagnosis not present

## 2017-04-13 DIAGNOSIS — I5042 Chronic combined systolic (congestive) and diastolic (congestive) heart failure: Secondary | ICD-10-CM | POA: Diagnosis not present

## 2017-04-13 NOTE — Progress Notes (Signed)
Cardiac Individual Treatment Plan  Patient Details  Name: Wayne [name] MRN: [id number] Date of Birth: 1941-02-26 Referring Provider:     Judith Gap from 12/16/2016 in [place]  Referring Provider  Loralie Champagne, MD      Initial Encounter Date:    CARDIAC REHAB PHASE II ORIENTATION from 12/16/2016 in [facility]  Date  12/16/16  Referring Provider  Loralie Champagne, MD      Visit Diagnosis: 11/17/16 S/P coronary artery stent placement  Patient's Home Medications on Admission:  Current Outpatient Prescriptions:  .  acetaminophen (TYLENOL) 500 MG tablet, Take 500 mg by mouth daily as needed for moderate pain or headache., Disp: , Rfl:  .  amiodarone (PACERONE) 200 MG tablet, 200 mg BID twice daily until 02/18/17, Then take 200 mg daily., Disp: 40 tablet, Rfl: 6 .  digoxin (LANOXIN) 0.125 MG tablet, Take 0.5 tablets (0.0625 mg total) by mouth daily., Disp: 15 tablet, Rfl: 6 .  diphenhydramine-acetaminophen (TYLENOL PM) 25-500 MG TABS tablet, Take 1 tablet by mouth at bedtime as needed (sleep)., Disp: , Rfl:  .  furosemide (LASIX) 20 MG tablet, Take 4 tablets (80 mg total) by mouth daily., Disp: 120 tablet, Rfl: 6 .  glimepiride (AMARYL) 2 MG tablet, TAKE 1 TABLET(2 MG) BY MOUTH DAILY BEFORE BREAKFAST, Disp: 90 tablet, Rfl: 1 .  Lancets (ACCU-CHEK SOFT TOUCH) lancets, Check blood sugars 1-2 times per day. DX: E11.65, Disp: 200 each, Rfl: 5 .  losartan (COZAAR) 25 MG tablet, Take 1 tablet (25 mg total) by mouth 2 (two) times daily., Disp: 60 tablet, Rfl: 6 .  Multiple Vitamins-Minerals (CENTRUM SILVER ADULT 50+) TABS, Take 1 tablet by mouth daily., Disp: , Rfl:  .  pravastatin (PRAVACHOL) 20 MG tablet, Take 1 tablet (20 mg total) by mouth every evening., Disp: 30 tablet, Rfl: 2 .  Rivaroxaban (XARELTO) 15 MG TABS tablet, Take 1 tablet (15 mg total) by mouth daily., Disp: 90 tablet, Rfl: 3 .  spironolactone  (ALDACTONE) 25 MG tablet, Take 0.5 tablets (12.5 mg total) by mouth daily., Disp: 15 tablet, Rfl: 6 .  ticagrelor (BRILINTA) 60 MG TABS tablet, Take 1 tablet (60 mg total) by mouth 2 (two) times daily., Disp: 60 tablet, Rfl: 6 .  carvedilol (COREG) 12.5 MG tablet, Take 1 tablet (12.5 mg total) by mouth 2 (two) times daily., Disp: 60 tablet, Rfl: 6 .  Evolocumab (REPATHA SURECLICK) 638 MG/ML SOAJ, Inject 1 pen into the skin every 14 (fourteen) days., Disp: , Rfl:  .  Omega-3 Fatty Acids (FISH OIL) 1000 MG CAPS, Take 2,000 mg by mouth daily., Disp: , Rfl:  .  traMADol (ULTRAM) 50 MG tablet, Take 1 tablet (50 mg total) by mouth every 6 (six) hours as needed for moderate pain (at device insertion site)., Disp: 30 tablet, Rfl: 0  Past Medical History: Past Medical History:  Diagnosis Date  . Adenocarcinoma of prostate ([place])    s/p seed implants  . Arthritis   . Cellulitis of left leg    a. 06/5642 complicated by septic shock  . Chronic combined systolic and diastolic CHF (congestive heart failure) ([place])   . Colon polyps   . CORONARY ATHEROSCLEROSIS NATIVE CORONARY ARTERY    a. 01/2011 Cath/PCI: LM nl, LAD 40-50p, D1 80-small, LCX 95-small, RI 90, RCA 100, EF 20%;  b. 01/2011 Card MRI - No transmural scar;  c. 01/2011 PCI RCA->5 Promus DES, RI->3.0x16 Promus DES; d. Cath  01/13/13 patent LAD & Ramus, diffuse LCx dz, RCA mult overlapping stents w/ 95% osital stenosis, EF 20%, s/p DES to ostial/prox RCA 01/24/13   . Diabetes mellitus, type 2 ([place])   . GERD (gastroesophageal reflux disease)   . Hematoma of leg    a. left leg hematoma 03/2012 in the setting of asa/effient  . Herpes zoster ophthalmicus   . HYPERLIPIDEMIA    intolerant to Lipitor (myalgias)  . HYPERTENSION   . Ischemic cardiomyopathy    a. 01/2012 Echo EF 45%, mild LVH; b. OE70%, grade 1 diastolic dysfunction, diffuse hypokinesis, inferoposterior akinesis   . Noncompliance   . Obesity   . OSA (obstructive sleep apnea)   . Polymyalgia  rheumatica ([facility])     Tobacco Use: History  Smoking Status  . Former Smoker  . Packs/day: 0.30  . Years: 20.00  . Types: Cigarettes  . Quit date: 02/23/1989  Smokeless Tobacco  . Never Used    Labs: Recent Review Flowsheet Data    Labs for ITP Cardiac and Pulmonary Rehab Latest Ref Rng & Units 10/06/2016 11/14/2016 11/14/2016 12/23/2016 04/27/2017   Cholestrol 0 - 200 mg/dL 146 - - 189 204(H)   LDLCALC 0 - 99 mg/dL 87 - - 104(H) UNABLE TO CALCULATE IF TRIGLYCERIDE OVER 400 mg/dL   LDLDIRECT mg/dL - - - - -   HDL >40 mg/dL 32(L) - - 33(L) 32(L)   Trlycerides <150 mg/dL 135 - - 259(H) 534(H)   Hemoglobin A1c 4.6 - 6.5 % - - - - -   PHART 7.350 - 7.450 - - - - -   PCO2ART 35.0 - 45.0 mmHg - - - - -   HCO3 20.0 - 28.0 mmol/L - 24.6 24.6 - -   TCO2 0 - 100 mmol/L - 26 26 - -   ACIDBASEDEF 0.0 - 2.0 mmol/L - 1.0 1.0 - -   O2SAT % - 57.0 57.0 - -      Capillary Blood Glucose: Lab Results  Component Value Date   GLUCAP 292 (H) 03/18/2017   GLUCAP 222 (H) 02/13/2017   GLUCAP 233 (H) 02/12/2017   GLUCAP 279 (H) 02/12/2017   GLUCAP 297 (H) 02/12/2017     Exercise Target Goals:    Exercise Program Goal: Individual exercise prescription set with THRR, safety & activity barriers. Participant demonstrates ability to understand and report RPE using BORG scale, to self-measure pulse accurately, and to acknowledge the importance of the exercise prescription.  Exercise Prescription Goal: Starting with aerobic activity 30 plus minutes a day, 3 days per week for initial exercise prescription. Provide home exercise prescription and guidelines that participant acknowledges understanding prior to discharge.  Activity Barriers & Risk Stratification:     Activity Barriers & Cardiac Risk Stratification - 12/16/16 1616      Activity Barriers & Cardiac Risk Stratification   Cardiac Risk Stratification High      6 Minute Walk:     6 Minute Walk    Row Name 12/16/16 1616 04/10/17 1031  04/15/17 1001     6 Minute Walk   Phase Initial Discharge  -   Distance 1175 feet 1000 feet  -   Distance % Change  -  - -14.89 %   Walk Time 6 minutes 6 minutes  -   # of Rest Breaks 0 0  -   MPH 2.2 1.89  -   METS 2.35 2.02  -   RPE 13 13  -   VO2 Peak 8.24 7.1  -  Symptoms Yes (comment) Yes (comment)  -   Comments 5/10 L hip/knee pain L hip pain  -   Resting HR 98 bpm 74 bpm  -   Resting BP 108/64 108/62  -   Max Ex. HR 124 bpm 86 bpm  -   Max Ex. BP 134/62 118/74  -   2 Minute Post BP 106/60  - 112/62      Oxygen Initial Assessment:   Oxygen Re-Evaluation:   Oxygen Discharge (Final Oxygen Re-Evaluation):   Initial Exercise Prescription:     Initial Exercise Prescription - 12/16/16 1600      Date of Initial Exercise RX and Referring Provider   Date 12/16/16   Referring Provider Loralie Champagne, MD     Recumbant Bike   Level 2   Minutes 10   METs 1.2     NuStep   Level 2   Minutes 10   METs 1.5     Track   Laps 8   Minutes 10   METs 2.39     Prescription Details   Frequency (times per week) 3   Duration Progress to 30 minutes of continuous aerobic without signs/symptoms of physical distress     Intensity   THRR 40-80% of Max Heartrate 59-118   Ratings of Perceived Exertion 11-13   Perceived Dyspnea 0-4     Progression   Progression Continue progressive overload as per policy without signs/symptoms or physical distress.     Resistance Training   Training Prescription Yes   Weight 2lbs   Reps 10-12      Perform Capillary Blood Glucose checks as needed.  Exercise Prescription Changes:      Exercise Prescription Changes    Row Name 01/13/17 1500 02/10/17 1200 03/09/17 1600 03/24/17 1500 04/06/17 1600     Response to Exercise   Blood Pressure (Admit) 118/62 110/60 116/60 115/52 122/60   Blood Pressure (Exercise) 120/62 128/60 134/64 120/60 124/62   Blood Pressure (Exit) 104/60 110/60 104/60 115/65 108/62   Heart Rate (Admit) 97 bpm  77 bpm 74 bpm 80 bpm 75 bpm   Heart Rate (Exercise) 105 bpm 105 bpm 88 bpm 90 bpm 87 bpm   Heart Rate (Exit) 96 bpm 72 bpm 71 bpm 80 bpm 75 bpm   Rating of Perceived Exertion (Exercise) _0 Symptoms _1    Comments Reviewed HEP on 01/07/17 Reviewed HEP on 01/07/17 Reviewed HEP on 01/07/17 Reviewed HEP on 01/07/17 Reviewed HEP on 01/07/17   Duration Progress to 30 minutes of continuous aerobic without signs/symptoms of physical distress Progress to 30 minutes of  aerobic without signs/symptoms of physical distress Progress to 30 minutes of  aerobic without signs/symptoms of physical distress Progress to 30 minutes of  aerobic without signs/symptoms of physical distress Progress to 30 minutes of  aerobic without signs/symptoms of physical distress   Intensity _2      Progression   Average METs 2.4 2.4 2.3 2.5 2.8     Resistance Training   Training Prescription _3    Weight 5lbs 5lbs 5lbs 5lbs 6lbs   Reps 10-12 10-15 10-15 10-15 10-15   Time  - 10 Minutes 10 Minutes 10 Minutes 10 Minutes     Recumbant Bike   Level _4 3.5   Minutes _5 METs 2.1 2.3 2.2 2.4 3  NuStep   Level _0 Minutes _1 METs 2.7 2.7 2.2 2.8 2.6     Arm Ergometer   Level _2 Watts _3 Minutes _4 METs 2.25 2.27 2.3 2.3 3     Home Exercise Plan   Plans to continue exercise at [place] on 01/07/17 see progress note Home (comment)  Reviewed HEP on 01/07/17 see progress note Home (comment)  Reviewed HEP on 01/07/17 see progress note Home (comment)  Reviewed HEP on 01/07/17 see progress note Home (comment)  Reviewed HEP on 01/07/17 see progress note   Frequency Add 2 additional days to program exercise sessions. Add 2 additional days to program exercise sessions. Add 2 additional days to program exercise sessions. Add 2  additional days to program exercise sessions. Add 2 additional days to program exercise sessions.   Initial Home Exercises Provided  - 01/07/17 01/07/17 01/07/17 01/07/17     Exercise Review   Progression _5    Row Name 04/13/17 1000             Response to Exercise   Blood Pressure (Admit) 104/60       Blood Pressure (Exercise) 126/68       Blood Pressure (Exit) 102/62       Heart Rate (Admit) 68 bpm       Heart Rate (Exercise) 86 bpm       Heart Rate (Exit) 68 bpm       Rating of Perceived Exertion (Exercise) 12       Symptoms none       Comments Reviewed HEP on 01/07/17       Duration Progress to 30 minutes of  aerobic without signs/symptoms of physical distress       Intensity THRR unchanged         Progression   Average METs 3.2         Resistance Training   Training Prescription Yes       Weight 6lbs       Reps 10-15       Time 10 Minutes         Recumbant Bike   Level 3.5       Minutes 10       METs 2.7         NuStep   Level 5       Minutes 10       METs 2.9         Arm Ergometer   Level 1       Watts 25       Minutes 10       METs 4         Home Exercise Plan   Plans to continue exercise at Home (comment)  Reviewed HEP on 01/07/17 see progress note       Frequency Add 2 additional days to program exercise sessions.       Initial Home Exercises Provided 01/07/17         Exercise Review   Progression Yes          Exercise Comments:      Exercise Comments    Row Name 01/13/17 1534 02/09/17 1105 04/07/17 1340 04/13/17 1047     Exercise Comments Reviewed METs and goals. Pt is tolerating exercise well;  will continue to monitor exercise progression. Reviewed METs and goals. Pt is tolerating exercise well; will continue to monitor exercise progression. Reviewed METs and goals. Pt is tolerating exercise well; will continue to monitor exercise progression. Pt completed 36 sessions of cardiac rehab. Pt plans to walk for exercise at home.  Discussed exercise progression, starting at 3(10' bouts) and walking with a rolator for balance/stability. Further discussed emergency/temperature precautions. Pt verbalize understanding.       Exercise Goals and Review:      Exercise Goals    Row Name 02/09/17 1106             Exercise Goals   Increase Physical Activity Yes       Intervention Provide advice, education, support and counseling about physical activity/exercise needs.;Develop an individualized exercise prescription for aerobic and resistive training based on initial evaluation findings, risk stratification, comorbidities and participant's personal goals.       Expected Outcomes Achievement of increased cardiorespiratory fitness and enhanced flexibility, muscular endurance and strength shown through measurements of functional capacity and personal statement of participant.       Increase Strength and Stamina Yes       Intervention Provide advice, education, support and counseling about physical activity/exercise needs.;Develop an individualized exercise prescription for aerobic and resistive training based on initial evaluation findings, risk stratification, comorbidities and participant's personal goals.       Expected Outcomes Achievement of increased cardiorespiratory fitness and enhanced flexibility, muscular endurance and strength shown through measurements of functional capacity and personal statement of participant.          Exercise Goals Re-Evaluation :     Exercise Goals Re-Evaluation    Row Name 02/10/17 1455 02/10/17 1456 04/07/17 1338         Exercise Goal Re-Evaluation   Exercise Goals Review Increase Physical Activity;Increase Strenth and Stamina  - Increase Physical Activity;Increase Strenth and Stamina     Comments Pt is increasing in actvity levels and has improved MET levels Pt is increasing in actvity levels and has increased workload on Nustep machine. Pt is tolerating WL increases very well. Pt  stated " balance is improving and feeling stronger"      Expected Outcomes  - Pt will continue to improve in cardiorespiratory fitness Pt will continue to improve in cardiorespiratory fitness         Discharge Exercise Prescription (Final Exercise Prescription Changes):     Exercise Prescription Changes - 04/13/17 1000      Response to Exercise   Blood Pressure (Admit) 104/60   Blood Pressure (Exercise) 126/68   Blood Pressure (Exit) 102/62   Heart Rate (Admit) 68 bpm   Heart Rate (Exercise) 86 bpm   Heart Rate (Exit) 68 bpm   Rating of Perceived Exertion (Exercise) 12   Symptoms none   Comments Reviewed HEP on 01/07/17   Duration Progress to 30 minutes of  aerobic without signs/symptoms of physical distress   Intensity THRR unchanged     Progression   Average METs 3.2     Resistance Training   Training Prescription Yes   Weight 6lbs   Reps 10-15   Time 10 Minutes     Recumbant Bike   Level 3.5   Minutes 10   METs 2.7     NuStep   Level 5   Minutes 10   METs 2.9     Arm Ergometer   Level 1   Watts 25   Minutes 10   METs 4  Home Exercise Plan   Plans to continue exercise at Home (comment)  Reviewed HEP on 01/07/17 see progress note   Frequency Add 2 additional days to program exercise sessions.   Initial Home Exercises Provided 01/07/17     Exercise Review   Progression Yes      Nutrition:  Target Goals: Understanding of nutrition guidelines, daily intake of sodium <1575m, cholesterol <2048m calories 30% from fat and 7% or less from saturated fats, daily to have 5 or more servings of fruits and vegetables.  Biometrics:     Pre Biometrics - 12/16/16 1619      Pre Biometrics   Height 5' 7.5" (1.715 m)   Weight 225 lb 15.5 oz (102.5 kg)   Waist Circumference 46 inches   Hip Circumference 46 inches   Waist to Hip Ratio 1 %   BMI (Calculated) 34.9   Triceps Skinfold 12 mm   % Body Fat 32.4 %   Grip Strength 42 kg   Flexibility 18 in   Single  Leg Stand 18 seconds         Post Biometrics - 04/15/17 0810       Post  Biometrics   Height 5' 7.5" (1.715 m)   Weight 229 lb 11.5 oz (104.2 kg)   Waist Circumference 45.5 inches   Hip Circumference 46 inches   Waist to Hip Ratio 0.99 %   BMI (Calculated) 35.5   Triceps Skinfold 23 mm   % Body Fat 35 %   Grip Strength 38 kg   Flexibility 14 in   Single Leg Stand 1.75 seconds  entry on 12/16/16 entered in error      Nutrition Therapy Plan and Nutrition Goals:     Nutrition Therapy & Goals - 01/16/17 1215      Nutrition Therapy   Diet Carb Modified, Therapeutic Lifestyle Changes     Personal Nutrition Goals   Nutrition Goal Wt loss of 1-2 lb/week to a wt loss goal of 6-24 lb at graduation from Cardiac Rehab   Personal Goal #2 Improved glycemic control as evidenced by a decrease in A1c from 8.7 to a goal of < 7%.     Intervention Plan   Intervention Prescribe, educate and counsel regarding individualized specific dietary modifications aiming towards targeted core components such as weight, hypertension, lipid management, diabetes, heart failure and other comorbidities.   Expected Outcomes Short Term Goal: Understand basic principles of dietary content, such as calories, fat, sodium, cholesterol and nutrients.;Long Term Goal: Adherence to prescribed nutrition plan.      Nutrition Discharge: Nutrition Scores:     Nutrition Assessments - 01/16/17 1215      MEDFICTS Scores   Pre Score 44      Nutrition Goals Re-Evaluation:     Nutrition Goals Re-Evaluation    Row Name 04/24/17 1152             Goals   Current Weight 229 lb 3.8 oz (104 kg)       Nutrition Goal Wt loss of 1-2 lb/week to a wt loss goal of 6-24 lb at graduation from [facility] has gained 4.3 lb. Wt loss goal not met. No updated A1c available to evaluate.          Personal Goal #2 Re-Evaluation   Personal Goal #2 Improved glycemic control as evidenced by a decrease in A1c from  8.7 to a goal of < 7%.  Nutrition Goals Re-Evaluation:     Nutrition Goals Re-Evaluation    [place] Name 04/24/17 1152             Goals   Current Weight 229 lb 3.8 oz (104 kg)       Nutrition Goal Wt loss of 1-2 lb/week to a wt loss goal of 6-24 lb at graduation from [facility] Pt has gained 4.3 lb. Wt loss goal not met. No updated A1c available to evaluate.          Personal Goal #2 Re-Evaluation   Personal Goal #2 Improved glycemic control as evidenced by a decrease in A1c from 8.7 to a goal of < 7%.          Nutrition Goals Discharge (Final Nutrition Goals Re-Evaluation):     Nutrition Goals Re-Evaluation - 04/24/17 1152      Goals   Current Weight 229 lb 3.8 oz (104 kg)   Nutrition Goal Wt loss of 1-2 lb/week to a wt loss goal of 6-24 lb at graduation from [facility] Pt has gained 4.3 lb. Wt loss goal not met. No updated A1c available to evaluate.      Personal Goal #2 Re-Evaluation   Personal Goal #2 Improved glycemic control as evidenced by a decrease in A1c from 8.7 to a goal of < 7%.      Psychosocial: Target Goals: Acknowledge presence or absence of significant depression and/or stress, maximize coping skills, provide positive support system. Participant is able to verbalize types and ability to use techniques and skills needed for reducing stress and depression.  Initial Review & Psychosocial Screening:     Initial Psych Review & Screening - 12/18/16 0935      Family Dynamics   Good Support System? Yes   Comments A brief assesment reveals no further psychosocial needed at this time.     Barriers   Psychosocial barriers to participate in program There are no identifiable barriers or psychosocial needs.     Screening Interventions   Interventions Encouraged to exercise      Quality of Life Scores:     Quality of Life - 04/13/17 1112      Quality of Life Scores   Health/Function Pre 17.23 %   Health/Function  Post 19.25 %   Health/Function % Change 11.72 %   Socioeconomic Pre 21.5 %   Socioeconomic Post 21.88 %   Socioeconomic % Change  1.77 %   Psych/Spiritual Pre 27 %   Psych/Spiritual Post 24.86 %   Psych/Spiritual % Change -7.93 %   Family Pre 20.63 %   Family Post 19.4 %   Family % Change -5.96 %   GLOBAL Pre 20.5 %   GLOBAL Post 20.93 %   GLOBAL % Change 2.1 %      PHQ-9: Recent Review Flowsheet Data    Depression screen [facility] 2/9 04/13/2017 03/09/2017 12/24/2016 09/30/2016   Decreased Interest 0 0 0 0   Down, Depressed, Hopeless 0 0 0 0   PHQ - 2 Score 0 0 0 0     Interpretation of Total Score  Total Score Depression Severity:  1-4 = Minimal depression, 5-9 = Mild depression, 10-14 = Moderate depression, 15-19 = Moderately severe depression, 20-27 = Severe depression   Psychosocial Evaluation and Intervention:     Psychosocial Evaluation - 04/13/17 0907      Discharge Psychosocial Assessment & Intervention   Discharge Continue support measures  as needed   Comments no psychosocial needs identified, no interventions necessary       Psychosocial Re-Evaluation:     Psychosocial Re-Evaluation    [place] Name 01/15/17 1542 02/11/17 1856 03/09/17 1051 04/03/17 1512       Psychosocial Re-Evaluation   Current issues with  - Current Anxiety/Panic Current Anxiety/Panic Current Anxiety/Panic    Comments Pt with good support system at home. Pt with good support system at home. pt is encouraged he is able to return to cardiac rehab. pt reports he feels better since his ICD implant.  pt has good support system at home. pt reports he feels better since his ICD implant.    pt is discouraged he is unable to drive.  however he is grateful for medical care provided and understands necessity of this limitation. pt states he looks for positive in situations to avoid depression. 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  pt has good support system at home.      Expected  Outcomes  -  - pt will exhibit good coping skills with positive, hopeful outlook.  pt will exhibit good coping skills with positive, hopeful outlook.    Interventions Encouraged to attend Cardiac Rehabilitation for the exercise;Relaxation education;Stress management education Stress management education;Relaxation education;Encouraged to attend Cardiac Rehabilitation for the exercise Stress management education;Relaxation education;Encouraged to attend Cardiac Rehabilitation for the exercise Stress management education;Relaxation education;Encouraged to attend Cardiac Rehabilitation for the exercise    Continue Psychosocial Services   -  - Follow up required by staff Follow up required by staff       Psychosocial Discharge (Final Psychosocial Re-Evaluation):     Psychosocial Re-Evaluation - 04/03/17 1512      Psychosocial Re-Evaluation   Current issues with Current Anxiety/Panic   Comments pt reports he feels better since his ICD implant.    pt is discouraged he is unable to drive.  however he is grateful for medical care provided and understands necessity of this limitation. pt states he looks for positive in situations to avoid depression. 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  pt has good support system at home.     Expected Outcomes pt will exhibit good coping skills with positive, hopeful outlook.   Interventions Stress management education;Relaxation education;Encouraged to attend Cardiac Rehabilitation for the exercise   Continue Psychosocial Services  Follow up required by staff      Vocational Rehabilitation: Provide vocational rehab assistance to qualifying candidates.   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 Participant will state understanding/return demonstration of topics presented.  Learning Barriers/Preferences:     Learning Barriers/Preferences - 12/16/16 1455      Learning Barriers/Preferences   Learning Barriers Sight   Learning Preferences Verbal Instruction;Written Material;Skilled Demonstration;Individual Instruction;Group Instruction      Education Topics: Count Your Pulse:  -Group instruction provided by verbal instruction, demonstration, patient participation and written materials to support subject.  Instructors address importance of being able to find your pulse and how to count your pulse when at home without a heart monitor.  Patients get hands on experience counting their pulse with staff help and  individually.   Heart Attack, Angina, and Risk Factor Modification:  -Group instruction provided by verbal instruction, video, and written materials to support subject.  Instructors address signs and symptoms of angina and heart attacks.    Also discuss risk factors for heart disease and how to make changes to improve heart health risk factors.   CARDIAC REHAB PHASE II EXERCISE from 04/10/2017 in [facility]  Date  03/11/17  Instruction Review Code  2- meets goals/outcomes      Functional Fitness:  -Group instruction provided by verbal instruction, demonstration, patient participation, and written materials to support subject.  Instructors address safety measures for doing things around the house.  Discuss how to get up and down off the floor, how to pick things up properly, how to safely get out of a chair without assistance, and balance training.   CARDIAC REHAB PHASE II EXERCISE from 04/10/2017 in [place]  Date  04/03/17  Instruction Review Code  2- meets goals/outcomes      Meditation and Mindfulness:  -Group instruction provided by  verbal instruction, patient participation, and written materials to support subject.  Instructor addresses importance of mindfulness and meditation practice to help reduce stress and improve awareness.  Instructor also leads participants through a meditation exercise.    CARDIAC REHAB PHASE II EXERCISE from 04/10/2017 in [place]  Date  04/01/17  Educator  Goodnight  Instruction Review Code  2- meets goals/outcomes      Stretching for Flexibility and Mobility:  -Group instruction provided by verbal instruction, patient participation, and written materials to support subject.  Instructors lead participants through series of stretches that are designed to increase flexibility thus improving mobility.  These stretches are additional exercise for major muscle groups that are typically performed during regular warm up and cool down.   CARDIAC REHAB PHASE II EXERCISE from 04/10/2017 in [place]  Date  04/10/17  Instruction Review Code  2- meets goals/outcomes      Hands Only CPR Anytime:  -Group instruction provided by verbal instruction, video, patient participation and written materials to support subject.  Instructors co-teach with [facility] video for hands only CPR.  Participants get hands on experience with mannequins.   Nutrition I class: Heart Healthy Eating:  -Group instruction provided by PowerPoint slides, verbal discussion, and written materials to support subject matter. The instructor gives an explanation and review of the Therapeutic Lifestyle Changes diet recommendations, which includes a discussion on lipid goals, dietary fat, sodium, fiber, plant stanol/sterol esters, sugar, and the components of a well-balanced, healthy diet.   Nutrition II class: Lifestyle Skills:  -Group instruction provided by PowerPoint slides, verbal discussion, and written materials to support subject matter. The instructor gives an explanation  and review of label reading, grocery shopping for heart health, heart healthy recipe modifications, and ways to make healthier choices when eating out.   Diabetes Question & Answer:  -Group instruction provided by PowerPoint slides, verbal discussion, and written materials to support subject matter. The instructor gives an explanation and review of diabetes co-morbidities, pre- and post-prandial blood glucose goals, pre-exercise blood glucose goals, signs, symptoms, and treatment of hypoglycemia and hyperglycemia, and foot care basics.   CARDIAC REHAB PHASE II EXERCISE from 04/10/2017 in [place]  Date  01/23/17  Educator  RD  Instruction Review Code  2- meets goals/outcomes      Diabetes Blitz:  -  Group instruction provided by PowerPoint slides, verbal discussion, and written materials to support subject matter. The instructor gives an explanation and review of the physiology behind type 1 and type 2 diabetes, diabetes medications and rational behind using different medications, pre- and post-prandial blood glucose recommendations and Hemoglobin A1c goals, diabetes diet, and exercise including blood glucose guidelines for exercising safely.    Portion Distortion:  -Group instruction provided by PowerPoint slides, verbal discussion, written materials, and food models to support subject matter. The instructor gives an explanation of serving size versus portion size, changes in portions sizes over the last 20 years, and what consists of a serving from each food group.   CARDIAC REHAB PHASE II EXERCISE from 04/10/2017 in [place]  Date  04/09/17  Educator  RD  Instruction Review Code  2- meets goals/outcomes      Stress Management:  -Group instruction provided by verbal instruction, video, and written materials to support subject matter.  Instructors review role of stress in heart disease and how to cope with stress positively.      CARDIAC REHAB PHASE II EXERCISE from 04/10/2017 in [place]  Date  01/02/17  Instruction Review Code  2- meets goals/outcomes      Exercising on Your Own:  -Group instruction provided by verbal instruction, power point, and written materials to support subject.  Instructors discuss benefits of exercise, components of exercise, frequency and intensity of exercise, and end points for exercise.  Also discuss use of nitroglycerin and activating EMS.  Review options of places to exercise outside of rehab.  Review guidelines for sex with heart disease.   CARDIAC REHAB PHASE II EXERCISE from 04/10/2017 in [place]  Date  01/14/17  Educator  Tet[place] EP  Instruction Review Code  2- meets goals/outcomes      Cardiac Drugs I:  -Group instruction provided by verbal instruction and written materials to support subject.  Instructor reviews cardiac drug classes: antiplatelets, anticoagulants, beta blockers, and statins.  Instructor discusses reasons, side effects, and lifestyle considerations for each drug class.   CARDIAC REHAB PHASE II EXERCISE from 04/10/2017 in [place]  Date  03/25/17  Educator  Kennyth Lose  Instruction Review Code  2- meets goals/outcomes      Cardiac Drugs II:  -Group instruction provided by verbal instruction and written materials to support subject.  Instructor reviews cardiac drug classes: angiotensin converting enzyme inhibitors (ACE-I), angiotensin II receptor blockers (ARBs), nitrates, and calcium channel blockers.  Instructor discusses reasons, side effects, and lifestyle considerations for each drug class.   CARDIAC REHAB PHASE II EXERCISE from 04/10/2017 in [place]  Date  12/24/16  Educator  Pharmacist  Instruction Review Code  2- meets goals/outcomes      Anatomy and Physiology of the Circulatory System:  -Group instruction  provided by verbal instruction, video, and written materials to support subject.  Reviews functional anatomy of heart, how it relates to various diagnoses, and what role the heart plays in the overall system.   CARDIAC REHAB PHASE II EXERCISE from 04/10/2017 in [place]  Date  03/18/17  Instruction Review Code  2- meets goals/outcomes      Knowledge Questionnaire Score:     Knowledge Questionnaire Score - 04/13/17 1108      Knowledge Questionnaire Score   Post Score 20/24      Core Components/Risk Factors/Patient  Goals at Admission:     Personal Goals and Risk Factors at Admission - 04/13/17 1046      Core Components/Risk Factors/Patient Goals on Admission   Personal Goal Improve walking tolerance and overall CV health.   Intervention Pt stated that walking tolerance has improved but struggles with balance. Discussed balance progression/leg/hip strengthening exercises for stability.   Expected Outcomes Pt will improve in balance, standing and walking tolerance      Core Components/Risk Factors/Patient Goals Review:      Goals and Risk Factor Review    Row Name 01/13/17 1535 03/10/17 1659 04/03/17 1510 04/07/17 0713       Core Components/Risk Factors/Patient Goals Review   Personal Goals Review Other Weight Management/Obesity;Improve shortness of breath with ADL's;Heart Failure;Lipids;Hypertension;Diabetes;Other Weight Management/Obesity;Improve shortness of breath with ADL's;Heart Failure;Lipids;Hypertension;Diabetes;Other  -    Review Pt is walking 2x/week at home for exercise without difficulty pt returned to [facility] after extended absence for ICD placement.  pt reports he feels better and is eager to resume exercise activities.  pt is tolerating exercise without difficulty. pt with improved skin tone and color.  pt reports increased strength, stamina and energy.  pt dyspnea and fatigue are improving.         Expected Outcomes Pt will improve in  exercise tolerance and overall fitness levels. Pt will improve in exercise tolerance and overall fitness levels with decreased dyspnea and fatigue.  Pt will improve in exercise tolerance and overall fitness levels with decreased dyspnea and fatigue.   -       Core Components/Risk Factors/Patient Goals at Discharge (Final Review):      Goals and Risk Factor Review - 04/07/17 0713      Core Components/Risk Factors/Patient Goals Review   Review         ITP Comments:     ITP Comments    Row Name 12/16/16 1339 01/16/17 1105 03/27/17 0825 03/27/17 0826     ITP Comments Dr Sherle Poe, Medical Director Patient attended Hypertension Class on 01/16/17 03/27/17, HTN, meets goals (P)  03/27/17, HTN, meets goals       Comments: Pt graduated from cardiac rehab program today with completion of 36 exercise sessions in Phase II. Pt maintained good attendance and progressed nicely during his participation in rehab as evidenced by increased MET level.   Medication list reconciled. Repeat  PHQ score- 0 .  Pt has made significant lifestyle changes and should be commended for his success. Pt feels he has achieved his goals during cardiac rehab.  Staff feels he has exceeded goals with increased strength/stamina post ICD implant.   Pt plans to continue exercising on his own until he is able to resume driving privileges.  At that time he would like to participate in cardiac maintenance program. Pt encouraged to continue home exercise to maintain current level of activity.

## 2017-04-14 ENCOUNTER — Encounter (HOSPITAL_COMMUNITY): Payer: Medicare Other

## 2017-04-15 ENCOUNTER — Encounter (HOSPITAL_COMMUNITY)
Admission: RE | Admit: 2017-04-15 | Discharge: 2017-04-15 | Disposition: A | Discharge status: Home / Self Care | Payer: Medicare Other | Source: Ambulatory Visit | Attending: Cardiology | Admitting: Cardiology

## 2017-04-15 ENCOUNTER — Encounter (HOSPITAL_COMMUNITY): Payer: Medicare Other

## 2017-04-15 ENCOUNTER — Ambulatory Visit (HOSPITAL_COMMUNITY): Payer: Medicare Other

## 2017-04-15 DIAGNOSIS — E785 Hyperlipidemia, unspecified: Secondary | ICD-10-CM | POA: Insufficient documentation

## 2017-04-15 DIAGNOSIS — E669 Obesity, unspecified: Secondary | ICD-10-CM | POA: Insufficient documentation

## 2017-04-15 DIAGNOSIS — Z87891 Personal history of nicotine dependence: Secondary | ICD-10-CM | POA: Insufficient documentation

## 2017-04-15 DIAGNOSIS — E119 Type 2 diabetes mellitus without complications: Secondary | ICD-10-CM | POA: Insufficient documentation

## 2017-04-15 DIAGNOSIS — I255 Ischemic cardiomyopathy: Secondary | ICD-10-CM | POA: Insufficient documentation

## 2017-04-15 DIAGNOSIS — I2510 Atherosclerotic heart disease of native coronary artery without angina pectoris: Secondary | ICD-10-CM | POA: Insufficient documentation

## 2017-04-15 DIAGNOSIS — G4733 Obstructive sleep apnea (adult) (pediatric): Secondary | ICD-10-CM | POA: Insufficient documentation

## 2017-04-15 DIAGNOSIS — I110 Hypertensive heart disease with heart failure: Secondary | ICD-10-CM | POA: Insufficient documentation

## 2017-04-15 DIAGNOSIS — M1990 Unspecified osteoarthritis, unspecified site: Secondary | ICD-10-CM | POA: Insufficient documentation

## 2017-04-15 DIAGNOSIS — K219 Gastro-esophageal reflux disease without esophagitis: Secondary | ICD-10-CM | POA: Insufficient documentation

## 2017-04-15 DIAGNOSIS — Z6834 Body mass index (BMI) 34.0-34.9, adult: Secondary | ICD-10-CM | POA: Insufficient documentation

## 2017-04-15 DIAGNOSIS — Z8546 Personal history of malignant neoplasm of prostate: Secondary | ICD-10-CM | POA: Insufficient documentation

## 2017-04-15 DIAGNOSIS — M353 Polymyalgia rheumatica: Secondary | ICD-10-CM | POA: Insufficient documentation

## 2017-04-15 DIAGNOSIS — Z7984 Long term (current) use of oral hypoglycemic drugs: Secondary | ICD-10-CM | POA: Insufficient documentation

## 2017-04-15 DIAGNOSIS — I5042 Chronic combined systolic (congestive) and diastolic (congestive) heart failure: Secondary | ICD-10-CM | POA: Insufficient documentation

## 2017-04-15 DIAGNOSIS — Z955 Presence of coronary angioplasty implant and graft: Secondary | ICD-10-CM | POA: Insufficient documentation

## 2017-04-15 DIAGNOSIS — Z79899 Other long term (current) drug therapy: Secondary | ICD-10-CM | POA: Insufficient documentation

## 2017-04-15 DIAGNOSIS — Z7982 Long term (current) use of aspirin: Secondary | ICD-10-CM | POA: Insufficient documentation

## 2017-04-17 ENCOUNTER — Encounter (HOSPITAL_COMMUNITY): Payer: Medicare Other

## 2017-04-17 ENCOUNTER — Ambulatory Visit (HOSPITAL_COMMUNITY): Payer: Medicare Other

## 2017-04-20 ENCOUNTER — Encounter (HOSPITAL_COMMUNITY)
Admission: RE | Admit: 2017-04-20 | Payer: Medicare Other | Source: Ambulatory Visit | Attending: Cardiology | Admitting: Cardiology

## 2017-04-20 ENCOUNTER — Encounter (HOSPITAL_COMMUNITY): Payer: Medicare Other

## 2017-04-20 ENCOUNTER — Ambulatory Visit (HOSPITAL_COMMUNITY): Payer: Medicare Other

## 2017-04-27 ENCOUNTER — Ambulatory Visit (HOSPITAL_COMMUNITY)
Admission: RE | Admit: 2017-04-27 | Discharge: 2017-04-27 | Disposition: A | Discharge status: Home / Self Care | Payer: Medicare Other | Source: Ambulatory Visit | Attending: Cardiology | Admitting: Cardiology

## 2017-04-27 ENCOUNTER — Encounter (HOSPITAL_COMMUNITY): Payer: Self-pay

## 2017-04-27 ENCOUNTER — Telehealth: Payer: Self-pay | Admitting: Pharmacist

## 2017-04-27 VITALS — BP 117/80 | HR 70 | Ht 67.5 in | Wt 228.5 lb

## 2017-04-27 DIAGNOSIS — Z95810 Presence of automatic (implantable) cardiac defibrillator: Secondary | ICD-10-CM | POA: Insufficient documentation

## 2017-04-27 DIAGNOSIS — Z6835 Body mass index (BMI) 35.0-35.9, adult: Secondary | ICD-10-CM | POA: Insufficient documentation

## 2017-04-27 DIAGNOSIS — I255 Ischemic cardiomyopathy: Secondary | ICD-10-CM | POA: Diagnosis not present

## 2017-04-27 DIAGNOSIS — E119 Type 2 diabetes mellitus without complications: Secondary | ICD-10-CM | POA: Insufficient documentation

## 2017-04-27 DIAGNOSIS — I462 Cardiac arrest due to underlying cardiac condition: Secondary | ICD-10-CM | POA: Insufficient documentation

## 2017-04-27 DIAGNOSIS — I110 Hypertensive heart disease with heart failure: Secondary | ICD-10-CM | POA: Insufficient documentation

## 2017-04-27 DIAGNOSIS — I739 Peripheral vascular disease, unspecified: Secondary | ICD-10-CM | POA: Insufficient documentation

## 2017-04-27 DIAGNOSIS — E785 Hyperlipidemia, unspecified: Secondary | ICD-10-CM | POA: Insufficient documentation

## 2017-04-27 DIAGNOSIS — Z7984 Long term (current) use of oral hypoglycemic drugs: Secondary | ICD-10-CM | POA: Diagnosis not present

## 2017-04-27 DIAGNOSIS — I480 Paroxysmal atrial fibrillation: Secondary | ICD-10-CM | POA: Insufficient documentation

## 2017-04-27 DIAGNOSIS — E784 Other hyperlipidemia: Secondary | ICD-10-CM | POA: Diagnosis not present

## 2017-04-27 DIAGNOSIS — I4901 Ventricular fibrillation: Secondary | ICD-10-CM | POA: Diagnosis not present

## 2017-04-27 DIAGNOSIS — Z79899 Other long term (current) drug therapy: Secondary | ICD-10-CM | POA: Diagnosis not present

## 2017-04-27 DIAGNOSIS — G4733 Obstructive sleep apnea (adult) (pediatric): Secondary | ICD-10-CM | POA: Insufficient documentation

## 2017-04-27 DIAGNOSIS — I5022 Chronic systolic (congestive) heart failure: Secondary | ICD-10-CM | POA: Diagnosis not present

## 2017-04-27 DIAGNOSIS — I2510 Atherosclerotic heart disease of native coronary artery without angina pectoris: Secondary | ICD-10-CM | POA: Diagnosis not present

## 2017-04-27 DIAGNOSIS — E7849 Other hyperlipidemia: Secondary | ICD-10-CM

## 2017-04-27 DIAGNOSIS — Z7901 Long term (current) use of anticoagulants: Secondary | ICD-10-CM | POA: Diagnosis not present

## 2017-04-27 DIAGNOSIS — E669 Obesity, unspecified: Secondary | ICD-10-CM | POA: Diagnosis not present

## 2017-04-27 DIAGNOSIS — Z87891 Personal history of nicotine dependence: Secondary | ICD-10-CM | POA: Diagnosis not present

## 2017-04-27 LAB — COMPREHENSIVE METABOLIC PANEL
ALT: 24 U/L (ref 17–63)
AST: 27 U/L (ref 15–41)
Albumin: 4.2 g/dL (ref 3.5–5.0)
Alkaline Phosphatase: 80 U/L (ref 38–126)
Anion gap: 9 (ref 5–15)
BUN: 31 mg/dL — AB (ref 6–20)
CHLORIDE: 104 mmol/L (ref 101–111)
CO2: 24 mmol/L (ref 22–32)
CREATININE: 1.64 mg/dL — AB (ref 0.61–1.24)
Calcium: 9.7 mg/dL (ref 8.9–10.3)
GFR calc Af Amer: 46 mL/min — ABNORMAL LOW (ref >60)
GFR calc non Af Amer: 40 mL/min — ABNORMAL LOW (ref >60)
GLUCOSE: 118 mg/dL — AB (ref 65–99)
POTASSIUM: 4 mmol/L (ref 3.5–5.1)
SODIUM: 137 mmol/L (ref 135–145)
Total Bilirubin: 0.6 mg/dL (ref 0.3–1.2)
Total Protein: 7.2 g/dL (ref 6.5–8.1)

## 2017-04-27 LAB — LIPID PANEL
Cholesterol: 204 mg/dL — ABNORMAL HIGH (ref 0–200)
HDL: 32 mg/dL — AB (ref >40)
LDL Cholesterol: UNDETERMINED mg/dL (ref 0–99)
Total CHOL/HDL Ratio: 6.4 RATIO
Triglycerides: 534 mg/dL — ABNORMAL HIGH (ref <150)
VLDL: UNDETERMINED mg/dL (ref 0–40)

## 2017-04-27 LAB — TSH: TSH: 1.888 u[IU]/mL (ref 0.350–4.500)

## 2017-04-27 LAB — DIGOXIN LEVEL: Digoxin Level: 0.6 ng/mL — ABNORMAL LOW (ref 0.8–2.0)

## 2017-04-27 MED ORDER — CARVEDILOL 12.5 MG PO TABS: 12.5000 mg | ORAL_TABLET | Freq: Two times a day (BID) | ORAL | 6 refills | Status: DC

## 2017-04-27 NOTE — Telephone Encounter (Signed)
-----   Message from Scarlette Calico, RN sent at 04/27/2017  2:47 PM EDT ----- Wayne [name], we did his Lipid panel today for you guys however he has not been taking his Repatha.  Apparently he used the 2 vials he got then he did not get anymore so he hasn't been taking and he didn't think to call and ask anyone, he thought it was for diabetes and said he didn't have diabetes and didn't need it.  Dr Aundra Dubin explained his cholesterol and need for med, he is agreeable to restart, can you please get it for him and advise him what to do when he needs more please, thanks

## 2017-04-27 NOTE — Patient Instructions (Signed)
Increase Carvedilol to 12.5 mg Twice daily   The lipid clinic will follow up with you about restarting your [place] today  Your physician recommends that you schedule a follow-up appointment in: 1 month

## 2017-04-27 NOTE — Telephone Encounter (Signed)
Called pt to see why he stopped taking Repatha since he has been seen multiple times in lipid clinic and provided with thorough instructions regarding the need for continued injections to lower his cholesterol. He states he just didn't call for his refills and then stopped taking it.  Again advised pt why we need to lower his cholesterol and provided him with the [facility] number. Advised him that he needs to take responsibility for his refill sand call them whenever he is due for refills. He verbalized understanding and had no further questions.

## 2017-04-27 NOTE — Progress Notes (Signed)
Message sent to [facility], pharm D in lipid clinic to follow up with patient regarding his [place].

## 2017-04-27 NOTE — Progress Notes (Signed)
Patient ID: Wayne [name], male   DOB: 10/25/1941, 76 y.o.   MRN: [id number]   Advanced Heart Failure Clinic Note   Patient ID: Wayne [name], male   DOB: 06/02/1941, 76 y.o.   MRN: [id number] PCP: Dr. Elease Hashimoto Cardiology: Dr. Aundra Dubin  76 yo with history of DM, HTN, CAD, and ischemic cardiomyopathy presents for cardiology followup. Patient had a CHF exacerbation in 2/12 and was found to have LV systolic dysfunction with EF around 20%. LHC showed RCA, ramus, and CFX disease. RCA was subtotally occluded. Cardiac MRI showed that all walls, including the inferior wall, should be viable. Patient therefore underwent opening of his chronic totally occluded RCA as well as PCI to the ramus in 2/12. He received drug eluting stents and was on Effient.  He had an echo in 2/13 that showed EF improved to 45% with moderate LV dilation and mild LV hypertrophy.   In 5/13, he developed a large left lower leg hematoma.  He was still on Effient at that time.  ASA and Effient were stopped.  The hematoma did not resolve.  In 7/13, he was re-admitted with septic shock from MSSA from an abscess in his left gastrocnemius.  He also grew Pseudomonas from the left gastrocnemius as well.  He had a prolonged course in the hospital and later in a rehab facility.  Ultimately, he got back home again.  I had him get an echo in 1/14, and this showed EF 15% with diffuse hypokinesis and inferoposterior akinesis.  He had been off of most of his prior cardiac medications.  I took him back for [facility] in 1/14.  This showed subtotal occlusion of a small AV LCx and 95% ostial in-stent restenosis in the RCA.  He was treated in 2/14 with a Xience DES to the ostial RCA and begun on Plavix.  Unfortunately, he developed diffuse hives after starting Plavix and had to be switched to ticlopidine.  He has tolerated ticlopidine.   Echo done in 12/14 showed some improvement in LV function but EF was still low at 30-35%.  He did not want ICD.    Presented to ED  11/14/15 with worsening SOB after a few steps and CXR with CHF and bilateral effusions in the setting of marked medical non-compliance. States he stopped taking his medicines in 01/2015 (except for ASA 81 and a multivitamin).  He was feeling good and decided that he did not need them anymore.  Also c/o URI symptoms. Diuresed over 2 L with IV diuretics in the ER and started on lasix 20 mg daily.   Echo (12/16) showed EF 20-25% with diffuse hypokinesis (down from 35% in 12/15).  Echo 10/17 showed EF 15% with mildly decreased RV systolic function.    He had left and right heart catheterization in 12/17. This showed elevated right and left heart filling pressures and low cardiac output. The RCA was totally occluded with collaterals, there was 95% ostial to mid LAD stenosis.  Patient had PCI with DES to proximal-mid LAD.    He has had some difficulty with medication compliance.  However, he has is now in the [facility] trial medical arm and visits with the research nurse have really helped his medication compliance. He is currently taking meds as ordered.   He was noted to be in atrial fibrillation with RVR in 2/18 at cardiac rehab. He felt fatigued.  He was admitted and started on amiodarone for rate control. ASA was stopped, ticagrelor was decreased to 60 mg  bid, and Xarelto 15 mg daily was started.  He converted spontaneously back to NSR.    He suffered a ventricular fibrillation arrest in 3/18.  Luckily, he was wearing a Lifevest and was shocked.  He was admitted and started on amiodarone.  Medtronic ICD was placed.  Echo 3/18 with EF 20-25%.   Weight is stable.  Spironolactone has been decreased to 12.5 mg daily due to elevated K.  He has completed cardiac rehab.  No lightheadedness.  No significant exertional dyspnea, able to do all activities at this point without difficulty.  No orthopnea/PND.  Of note, he took 2 doses of Repatha but has not taken anymore.  Thought it was a diabetes medication and did not  think his glucose was high enough to warrant its use.   Optivol: Stable thoracic impedance, no atrial fibrillation or VT.   Labs (12/16): K 4.9, creatinine 1.23 Labs (1/17): K 4.7, creatinine 1.15, BNP 1433 Labs (2/17): K 4.7, creatinine 1.14, BNP 870 Labs (3/17): K 4.5, creatinine 1.10, BNP 1257 Labs (5/17): K 4.2, creatinine 1.2, BNP 818 Labs (7/17): LDL 149, HDL 40, TGs 210 Labs (9/17): K 4.9, creatinine 1.36 Labs (10/17): K 3.7, creatinine 1.2, LDL 87, HDL 32, LFTs normal Labs (12/17): K 4.5, creatinine 1.34, digoxin 0.3 Labs (2/18): K 4.4, creatinine 1.48, hgb 11.8 Labs (3/18): K 5 => 4.6, creatinine 1.54 => 1.46, digoxin 1.3 => 0.3  ECG: NSR, IVCD 128 msec  Past Medical History:  1. HYPERTENSION  2. HYPERLIPIDEMIA: Myalgias with atorvastatin and Crestor 3. ECZEMA  4. RHINITIS  5. HERPES ZOSTER OPHTHALMICUS  6. ADENOCARCINOMA, PROSTATE: Status post prostatectomy in 2009. Has had some incontinence since then.  7. Diabetes mellitus type II  8. Arthritis  9. Obesity  10. GERD: rare  11. CAD: Presented with exertional dyspnea, never had chest pain. LHC (2/12) with subtotalled proximal RCA and left to right collaterals, 90% proximal moderate-sized ramus, 95% proximal relatively small CFX, 40-50% proximal LAD. Cardiac MRI (2/12) showed EF 21%, some mild scar in basal segments but all wall segments would be expected to be viable. DES x 5 (overlapping) to RCA, DES x 1 to RI 01/30/11 .  Bled into leg with Effient use (long, complicated course).  LHC (1/14): AV LCx small with subtotal occlusion, 95% ostial instent restenosis in RCA. PCI in 2/14 to ostial RCA with 3.5 x 15 Xience DES.  Plavix allergy (hives) so put on ticlopidine.  - LHC (12/17):  the RCA was totally occluded with collaterals, there was 95% ostial to mid LAD stenosis, patient had PCI with DES to proximal-mid LAD. 12. Ischemic CMP: Echo (2/12) with moderately dilated LV, EF about 20% with diffuse hypokinesis and inferior  akinesis, pseudonormal diastolic function, mild MR, severe LAE, mildly decreased RV systolic function. RHC (2/12) with mean RA 12, PA 40/25, mean PCWP 26, CI 2.1.  Echo (5/12) with EF 40% (appeared worse to my eye) with posterior HK, basal inferior AK, inferoseptal AK, basal anteroseptal AK, mild MR.  Cardiac MRI was repeated and showed EF 32% (improved from 21%) and mild LV dilation (was severely dilated before) with diffuse hypokinesis and subendocardial scar in the basal inferior, basal posterior, and basal anterolateral segments.  Echo (2/13) with EF 45%, moderate LV dilation, mild LVH.  Echo (1/14) with EF 15%, diffuse hypokinesis, inferoposterior akinesis, mild MR, grade I diastolic dysfunction.  Echo (5/14) with EF 30-35%, mild LV dilation, akinesis of the basal inferior wall otherwise diffuse hypokinesis.  Echo (12/14) with EF  30-35%, mild LV dilation, diffuse hypokinesis with basal inferior and posterior akinesis. Echo (12/15) with EF 35%, mildly dilated LV, wall motion abnormalities, normal RV size and systolic function. Echo (12/16) with EF 20-25%, diffuse hypokinesis, severe LV dilation, mild MR.  - Echo (10/17): EF 15%, grade II diastolic dysfunction, mild MR, mildly decreased RV systolic function.  - Possible angioedema related to Entresto.  - Hyperkalemia with spironolactone 12.5 daily.  - CPX (10/17): peak VO2 10.6, VE/VCO2 slope 48.6, RER 1.12.  Severe HF limitation.  - RHC (12/17): mean RA 14, PA 53/27, mean PCWP 25, CI 1.93.  - Echo (3/18): EF 20-25%, moderate LV dilation, mild LVH, normal RV size and systolic function, mild aortic stenosis.  - Medtronic ICD 3/18.  13. Cervical OA.  14. Polymyalgia rheumatica 15. OSA: Severe on sleep study 10/12.  On CPAP.  16. Left lower leg hematoma in setting of Effient use.  He developed a left gastrocnemius abscess and septic shock with prolonged hospitalization beginning in 7/13.  17. PAD: Lower extremity arterial doppler evaluation in 2/17  showed occluded peroneal arteries bilaterally, ABI 1.1 (R) and 1.0 (L).   18. Carotid dopplers (10/17) with minimal disease.  19. Atrial fibrillation: Paroxysmal.  20. Ventricular fibrillation arrest: 3/18.  Medtronic ICD placed, now on amiodarone.  21. Aortic stenosis: Mild on 3/18 echo.   Family History:  Father died with MI at age 75. He was an alcoholic. Mother died with cancer at around 64.   Social History:  Occupation: retired Administrator  Married, lives in [place]  Past smoker, quit around [place]: all systems reviewed and negative except as per HPI.    Current Outpatient Prescriptions  Medication Sig Dispense Refill  . acetaminophen (TYLENOL) 500 MG tablet Take 500 mg by mouth daily as needed for moderate pain or headache.    Marland Kitchen amiodarone (PACERONE) 200 MG tablet 200 mg BID twice daily until 02/18/17, Then take 200 mg daily. 40 tablet 6  . carvedilol (COREG) 12.5 MG tablet Take 1 tablet (12.5 mg total) by mouth 2 (two) times daily. 60 tablet 6  . digoxin (LANOXIN) 0.125 MG tablet Take 0.5 tablets (0.0625 mg total) by mouth daily. 15 tablet 6  . diphenhydramine-acetaminophen (TYLENOL PM) 25-500 MG TABS tablet Take 1 tablet by mouth at bedtime as needed (sleep).    . Evolocumab (REPATHA SURECLICK) 852 MG/ML SOAJ Inject 1 pen into the skin every 14 (fourteen) days.    . furosemide (LASIX) 20 MG tablet Take 4 tablets (80 mg total) by mouth daily. 120 tablet 6  . glimepiride (AMARYL) 2 MG tablet TAKE 1 TABLET(2 MG) BY MOUTH DAILY BEFORE BREAKFAST 90 tablet 1  . Lancets (ACCU-CHEK SOFT TOUCH) lancets Check blood sugars 1-2 times per day. DX: E11.65 200 each 5  . losartan (COZAAR) 25 MG tablet Take 1 tablet (25 mg total) by mouth 2 (two) times daily. 60 tablet 6  . Multiple Vitamins-Minerals (CENTRUM SILVER ADULT 50+) TABS Take 1 tablet by mouth daily.    . Rivaroxaban (XARELTO) 15 MG TABS tablet Take 1 tablet (15 mg total) by mouth daily. 90 tablet 3  . spironolactone  (ALDACTONE) 25 MG tablet Take 0.5 tablets (12.5 mg total) by mouth daily. 15 tablet 6  . ticagrelor (BRILINTA) 60 MG TABS tablet Take 1 tablet (60 mg total) by mouth 2 (two) times daily. 60 tablet 6  . traMADol (ULTRAM) 50 MG tablet Take 1 tablet (50 mg total) by mouth every 6 (six) hours as  needed for moderate pain (at device insertion site). 30 tablet 0  . pravastatin (PRAVACHOL) 20 MG tablet Take 1 tablet (20 mg total) by mouth every evening. 30 tablet 2   No current facility-administered medications for this encounter.     BP 117/80   Pulse 70   Ht 5' 7.5" (1.715 m)   Wt 228 lb 8 oz (103.6 kg)   SpO2 98%   BMI 35.26 kg/m    Wt Readings from Last 3 Encounters:  04/27/17 228 lb 8 oz (103.6 kg)  04/15/17 229 lb 11.5 oz (104.2 kg)  03/12/17 230 lb (104.3 kg)    General: NAD, obese.  Neck: Thick, JVP 7 cm, no thyromegaly or thyroid nodule. No carotid bruit.  HEENT: Normal.  Lungs: Clear to auscultation bilaterally CV: Nondisplaced PMI. Heart regular S1/S2, no S3/S4, no murmur. Difficult to palpate pedal pulses.  Abdomen: Soft, NT, mild/moderate distention, no HSM. No bruits or masses. +BS  Neurologic: Alert and oriented x 3.  Psych: Pleasant affect.  Extremities: No clubbing or cyanosis. No edema.  Assessment/Plan:  1. Chronic systolic CHF: Ischemic cardiomyopathy.  3/18 echo with EF 20-25%, diffuse hypokinesis.  CPX in 10/17 with severe HF limitation.  RHC in 12/17 with elevated filling pressures and low cardiac output.  He is now s/p PCI to proximal to mid LAD with much improved symptoms.  Medtronic ICD in 3/18 after vfib arrest.  Now NYHA class II with no volume overload on exam.  - Continue Lasix 80 mg daily, BMET today.  - Continue spironolactone 25 daily.   - Continue losartan 25 mg bid. Of note, patient had possible angioedema related to [facility].   - Increase Coreg to 12.5 mg bid.   - Continue digoxin, check level today.    - He may be an LVAD candidate in the future.     2. CAD:  Now s/p PCI to proximal LAD.  He had an occluded RCA but there were left to right collaterals.  Much improved symptomatically.  - Continue pravastatin.  He needs to restart Repatha.   - Anticoagulation for atrial fibrillation with antiplatelet agents is complicated with Mr Krall. He is allergic to Plavix, had spontaneous gastrocnemius hemorrhage with prasugrel.  He had been on ASA 81 + ticagrelor, had LAD stent in 12/17. We will not be able to transition him to Plavix while on anticoagulation given allergy. I am concerned with use of full dose ticagrelor with anticoagulation, especially with his prior spontaneous calf bleed. Based on [facility] data, will use Xarelto 15 mg daily. I decreased his ticagrelor dose to 60 mg bid and finish out at least 6 months of ticagrelor => continue ticagrelor until 6/18, then stop ticagrelor and increase Xarelto to 20 mg daily.  3. HLD: Has had myalgias with Crestor and atorvastatin.  He is now on low dose pravastatin.  He is supposed to be on Repatha, took 2 doses and has not had any since.  He had no side effects, apparently thought it was for diabetes.  I strongly encouraged him to restart Repatha.  Will ask lipid clinic to facilitate this.     4. PAD: Occluded peroneal arteries bilaterally.  No significant claudication.  5. Diabetes type II: Unable to get empagliflozin due to cost. 6. Atrial fibrillation:  He is in NSR today.  - As above, will be on combination of ticagrelor 60 mg bid and Xarelto 15 daily.  CBC today.  - Continue amiodarone 200 mg daily.  Check LFTs, TSH  today.  Will need regular eye exams while on amiodarone.  - Would consider atrial fibrillation ablation eventually based on [facility] data.  7. Ventricular fibrillation arrest: On amiodarone and has Medtronic ICD.  No driving until 7/84.  8. OSA: Not using CPAP.   Followup in 1 month.    Loralie Champagne  04/27/2017

## 2017-04-28 ENCOUNTER — Telehealth: Payer: Self-pay | Admitting: Pharmacist

## 2017-04-28 NOTE — Telephone Encounter (Signed)
Discussed lipid panel results with pt. He is aware that he needs to contact the [facility] for [facility] refills. He will also start taking fish oil 2g daily for his elevated TG. Discussed that his TG are uncontrolled DM are closely related. Advised pt that he needs to follow up with his PCP for DM management and to limit intake of carbs and sugars. Pt verbalized understanding and is in agreement with plan.

## 2017-05-12 ENCOUNTER — Other Ambulatory Visit: Payer: Self-pay | Admitting: Internal Medicine

## 2017-05-18 ENCOUNTER — Encounter: Payer: Medicare Other | Admitting: Internal Medicine

## 2017-05-22 ENCOUNTER — Emergency Department (HOSPITAL_COMMUNITY): Payer: Medicare Other

## 2017-05-22 ENCOUNTER — Inpatient Hospital Stay (HOSPITAL_COMMUNITY)
Admission: EM | Admit: 2017-05-22 | Discharge: 2017-05-27 | DRG: 265 | Disposition: A | Discharge status: Home / Self Care | Payer: Medicare Other | Attending: Internal Medicine | Admitting: Internal Medicine

## 2017-05-22 ENCOUNTER — Encounter (HOSPITAL_COMMUNITY): Payer: Self-pay | Admitting: *Deleted

## 2017-05-22 DIAGNOSIS — I481 Persistent atrial fibrillation: Secondary | ICD-10-CM | POA: Diagnosis present

## 2017-05-22 DIAGNOSIS — J810 Acute pulmonary edema: Secondary | ICD-10-CM | POA: Diagnosis not present

## 2017-05-22 DIAGNOSIS — M353 Polymyalgia rheumatica: Secondary | ICD-10-CM | POA: Diagnosis present

## 2017-05-22 DIAGNOSIS — C61 Malignant neoplasm of prostate: Secondary | ICD-10-CM

## 2017-05-22 DIAGNOSIS — J9600 Acute respiratory failure, unspecified whether with hypoxia or hypercapnia: Secondary | ICD-10-CM

## 2017-05-22 DIAGNOSIS — E785 Hyperlipidemia, unspecified: Secondary | ICD-10-CM | POA: Diagnosis present

## 2017-05-22 DIAGNOSIS — Z923 Personal history of irradiation: Secondary | ICD-10-CM

## 2017-05-22 DIAGNOSIS — J9601 Acute respiratory failure with hypoxia: Secondary | ICD-10-CM | POA: Diagnosis not present

## 2017-05-22 DIAGNOSIS — N179 Acute kidney failure, unspecified: Secondary | ICD-10-CM | POA: Diagnosis present

## 2017-05-22 DIAGNOSIS — I4891 Unspecified atrial fibrillation: Secondary | ICD-10-CM

## 2017-05-22 DIAGNOSIS — E118 Type 2 diabetes mellitus with unspecified complications: Secondary | ICD-10-CM | POA: Diagnosis not present

## 2017-05-22 DIAGNOSIS — L959 Vasculitis limited to the skin, unspecified: Secondary | ICD-10-CM | POA: Diagnosis not present

## 2017-05-22 DIAGNOSIS — I2510 Atherosclerotic heart disease of native coronary artery without angina pectoris: Secondary | ICD-10-CM | POA: Diagnosis present

## 2017-05-22 DIAGNOSIS — S0083XA Contusion of other part of head, initial encounter: Secondary | ICD-10-CM | POA: Diagnosis not present

## 2017-05-22 DIAGNOSIS — G4733 Obstructive sleep apnea (adult) (pediatric): Secondary | ICD-10-CM | POA: Diagnosis present

## 2017-05-22 DIAGNOSIS — I5042 Chronic combined systolic (congestive) and diastolic (congestive) heart failure: Secondary | ICD-10-CM | POA: Diagnosis present

## 2017-05-22 DIAGNOSIS — I5022 Chronic systolic (congestive) heart failure: Secondary | ICD-10-CM | POA: Diagnosis not present

## 2017-05-22 DIAGNOSIS — K219 Gastro-esophageal reflux disease without esophagitis: Secondary | ICD-10-CM | POA: Diagnosis present

## 2017-05-22 DIAGNOSIS — E1165 Type 2 diabetes mellitus with hyperglycemia: Secondary | ICD-10-CM | POA: Diagnosis present

## 2017-05-22 DIAGNOSIS — Z87891 Personal history of nicotine dependence: Secondary | ICD-10-CM | POA: Diagnosis not present

## 2017-05-22 DIAGNOSIS — Z955 Presence of coronary angioplasty implant and graft: Secondary | ICD-10-CM

## 2017-05-22 DIAGNOSIS — M1990 Unspecified osteoarthritis, unspecified site: Secondary | ICD-10-CM | POA: Diagnosis present

## 2017-05-22 DIAGNOSIS — T82118A Breakdown (mechanical) of other cardiac electronic device, initial encounter: Secondary | ICD-10-CM | POA: Diagnosis present

## 2017-05-22 DIAGNOSIS — I462 Cardiac arrest due to underlying cardiac condition: Secondary | ICD-10-CM | POA: Diagnosis present

## 2017-05-22 DIAGNOSIS — T82190D Other mechanical complication of cardiac electrode, subsequent encounter: Secondary | ICD-10-CM | POA: Diagnosis not present

## 2017-05-22 DIAGNOSIS — I4901 Ventricular fibrillation: Principal | ICD-10-CM

## 2017-05-22 DIAGNOSIS — Z8249 Family history of ischemic heart disease and other diseases of the circulatory system: Secondary | ICD-10-CM

## 2017-05-22 DIAGNOSIS — Z7902 Long term (current) use of antithrombotics/antiplatelets: Secondary | ICD-10-CM

## 2017-05-22 DIAGNOSIS — I110 Hypertensive heart disease with heart failure: Secondary | ICD-10-CM | POA: Diagnosis present

## 2017-05-22 DIAGNOSIS — Z9119 Patient's noncompliance with other medical treatment and regimen: Secondary | ICD-10-CM

## 2017-05-22 DIAGNOSIS — Z7984 Long term (current) use of oral hypoglycemic drugs: Secondary | ICD-10-CM

## 2017-05-22 DIAGNOSIS — I255 Ischemic cardiomyopathy: Secondary | ICD-10-CM | POA: Diagnosis present

## 2017-05-22 DIAGNOSIS — I480 Paroxysmal atrial fibrillation: Secondary | ICD-10-CM | POA: Diagnosis present

## 2017-05-22 DIAGNOSIS — I472 Ventricular tachycardia, unspecified: Secondary | ICD-10-CM

## 2017-05-22 DIAGNOSIS — Z6832 Body mass index (BMI) 32.0-32.9, adult: Secondary | ICD-10-CM | POA: Diagnosis not present

## 2017-05-22 DIAGNOSIS — I469 Cardiac arrest, cause unspecified: Secondary | ICD-10-CM | POA: Diagnosis not present

## 2017-05-22 DIAGNOSIS — I509 Heart failure, unspecified: Secondary | ICD-10-CM | POA: Diagnosis not present

## 2017-05-22 DIAGNOSIS — I452 Bifascicular block: Secondary | ICD-10-CM | POA: Diagnosis present

## 2017-05-22 DIAGNOSIS — R0902 Hypoxemia: Secondary | ICD-10-CM | POA: Diagnosis not present

## 2017-05-22 DIAGNOSIS — E1151 Type 2 diabetes mellitus with diabetic peripheral angiopathy without gangrene: Secondary | ICD-10-CM | POA: Diagnosis present

## 2017-05-22 DIAGNOSIS — M310 Hypersensitivity angiitis: Secondary | ICD-10-CM

## 2017-05-22 DIAGNOSIS — T82120A Displacement of cardiac electrode, initial encounter: Secondary | ICD-10-CM | POA: Diagnosis not present

## 2017-05-22 DIAGNOSIS — S0003XA Contusion of scalp, initial encounter: Secondary | ICD-10-CM | POA: Diagnosis not present

## 2017-05-22 DIAGNOSIS — Z8546 Personal history of malignant neoplasm of prostate: Secondary | ICD-10-CM

## 2017-05-22 DIAGNOSIS — Z881 Allergy status to other antibiotic agents status: Secondary | ICD-10-CM

## 2017-05-22 DIAGNOSIS — E669 Obesity, unspecified: Secondary | ICD-10-CM | POA: Diagnosis present

## 2017-05-22 DIAGNOSIS — Z7901 Long term (current) use of anticoagulants: Secondary | ICD-10-CM

## 2017-05-22 DIAGNOSIS — IMO0002 Reserved for concepts with insufficient information to code with codable children: Secondary | ICD-10-CM

## 2017-05-22 DIAGNOSIS — T82110S Breakdown (mechanical) of cardiac electrode, sequela: Secondary | ICD-10-CM | POA: Diagnosis not present

## 2017-05-22 DIAGNOSIS — J9690 Respiratory failure, unspecified, unspecified whether with hypoxia or hypercapnia: Secondary | ICD-10-CM | POA: Diagnosis not present

## 2017-05-22 DIAGNOSIS — Z95810 Presence of automatic (implantable) cardiac defibrillator: Secondary | ICD-10-CM

## 2017-05-22 DIAGNOSIS — I351 Nonrheumatic aortic (valve) insufficiency: Secondary | ICD-10-CM | POA: Diagnosis not present

## 2017-05-22 DIAGNOSIS — Z888 Allergy status to other drugs, medicaments and biological substances status: Secondary | ICD-10-CM

## 2017-05-22 DIAGNOSIS — Z79899 Other long term (current) drug therapy: Secondary | ICD-10-CM

## 2017-05-22 DIAGNOSIS — J90 Pleural effusion, not elsewhere classified: Secondary | ICD-10-CM | POA: Diagnosis not present

## 2017-05-22 LAB — COMPREHENSIVE METABOLIC PANEL
ALBUMIN: 3.4 g/dL — AB (ref 3.5–5.0)
ALT: 57 U/L (ref 17–63)
AST: 62 U/L — AB (ref 15–41)
Alkaline Phosphatase: 70 U/L (ref 38–126)
Anion gap: 8 (ref 5–15)
BILIRUBIN TOTAL: 0.9 mg/dL (ref 0.3–1.2)
BUN: 34 mg/dL — AB (ref 6–20)
CHLORIDE: 106 mmol/L (ref 101–111)
CO2: 24 mmol/L (ref 22–32)
Calcium: 8.9 mg/dL (ref 8.9–10.3)
Creatinine, Ser: 1.77 mg/dL — ABNORMAL HIGH (ref 0.61–1.24)
GFR calc Af Amer: 42 mL/min — ABNORMAL LOW (ref >60)
GFR calc non Af Amer: 36 mL/min — ABNORMAL LOW (ref >60)
GLUCOSE: 300 mg/dL — AB (ref 65–99)
POTASSIUM: 4.7 mmol/L (ref 3.5–5.1)
Sodium: 138 mmol/L (ref 135–145)
TOTAL PROTEIN: 6.7 g/dL (ref 6.5–8.1)

## 2017-05-22 LAB — HEPARIN LEVEL (UNFRACTIONATED): Heparin Unfractionated: >2.1 IU/mL — ABNORMAL HIGH (ref 0.30–0.70)

## 2017-05-22 LAB — CBC WITH DIFFERENTIAL/PLATELET
BASOS ABS: 0 10*3/uL (ref 0.0–0.1)
BASOS PCT: 0 %
Eosinophils Absolute: 0.3 10*3/uL (ref 0.0–0.7)
Eosinophils Relative: 3 %
HEMATOCRIT: 37.3 % — AB (ref 39.0–52.0)
Hemoglobin: 12 g/dL — ABNORMAL LOW (ref 13.0–17.0)
Lymphocytes Relative: 14 %
Lymphs Abs: 1.1 10*3/uL (ref 0.7–4.0)
MCH: 28.8 pg (ref 26.0–34.0)
MCHC: 32.2 g/dL (ref 30.0–36.0)
MCV: 89.4 fL (ref 78.0–100.0)
MONO ABS: 0.5 10*3/uL (ref 0.1–1.0)
Monocytes Relative: 6 %
NEUTROS ABS: 6.2 10*3/uL (ref 1.7–7.7)
NEUTROS PCT: 77 %
Platelets: 158 10*3/uL (ref 150–400)
RBC: 4.17 MIL/uL — AB (ref 4.22–5.81)
RDW: 13.4 % (ref 11.5–15.5)
WBC: 8.1 10*3/uL (ref 4.0–10.5)

## 2017-05-22 LAB — URINALYSIS, ROUTINE W REFLEX MICROSCOPIC
Bacteria, UA: NONE SEEN
Bilirubin Urine: NEGATIVE
Glucose, UA: 150 mg/dL — AB
Ketones, ur: NEGATIVE mg/dL
Leukocytes, UA: NEGATIVE
NITRITE: NEGATIVE
PH: 5 (ref 5.0–8.0)
Protein, ur: NEGATIVE mg/dL
SPECIFIC GRAVITY, URINE: 1.01 (ref 1.005–1.030)

## 2017-05-22 LAB — TROPONIN I: Troponin I: 0.2 ng/mL (ref <0.03)

## 2017-05-22 LAB — PROTIME-INR
INR: 2.06
PROTHROMBIN TIME: 23.5 s — AB (ref 11.4–15.2)

## 2017-05-22 LAB — I-STAT CG4 LACTIC ACID, ED: Lactic Acid, Venous: 3.17 mmol/L (ref 0.5–1.9)

## 2017-05-22 LAB — I-STAT TROPONIN, ED: Troponin i, poc: 0.04 ng/mL (ref 0.00–0.08)

## 2017-05-22 LAB — I-STAT ARTERIAL BLOOD GAS, ED
Acid-base deficit: 1 mmol/L (ref 0.0–2.0)
BICARBONATE: 23.2 mmol/L (ref 20.0–28.0)
O2 SAT: 100 %
PCO2 ART: 38 mmHg (ref 32.0–48.0)
TCO2: 24 mmol/L (ref 0–100)
pH, Arterial: 7.395 (ref 7.350–7.450)
pO2, Arterial: 201 mmHg — ABNORMAL HIGH (ref 83.0–108.0)

## 2017-05-22 LAB — GLUCOSE, CAPILLARY: Glucose-Capillary: 262 mg/dL — ABNORMAL HIGH (ref 65–99)

## 2017-05-22 LAB — APTT: APTT: 35 s (ref 24–36)

## 2017-05-22 LAB — MRSA PCR SCREENING: MRSA BY PCR: NEGATIVE

## 2017-05-22 LAB — AMMONIA: AMMONIA: 23 umol/L (ref 9–35)

## 2017-05-22 MED ORDER — MIDAZOLAM HCL 2 MG/2ML IJ SOLN: 1.0000 mg | INTRAMUSCULAR | Status: DC | PRN

## 2017-05-22 MED ORDER — CHLORHEXIDINE GLUCONATE 0.12% ORAL RINSE (MEDLINE KIT)
15.0000 mL | Freq: Two times a day (BID) | OROMUCOSAL | Status: DC
  Administered 2017-05-23 (×2): 15 mL via OROMUCOSAL

## 2017-05-22 MED ORDER — FENTANYL 2500MCG IN NS 250ML (10MCG/ML) PREMIX INFUSION
25.0000 ug/h | INTRAVENOUS | Status: DC
  Administered 2017-05-22: 25 ug/h via INTRAVENOUS
  Filled 2017-05-22: qty 250

## 2017-05-22 MED ORDER — INSULIN ASPART 100 UNIT/ML ~~LOC~~ SOLN
0.0000 [IU] | SUBCUTANEOUS | Status: DC
  Administered 2017-05-22: 8 [IU] via SUBCUTANEOUS
  Administered 2017-05-23: 2 [IU] via SUBCUTANEOUS
  Administered 2017-05-23 (×3): 5 [IU] via SUBCUTANEOUS
  Administered 2017-05-23: 3 [IU] via SUBCUTANEOUS
  Administered 2017-05-23 – 2017-05-24 (×5): 2 [IU] via SUBCUTANEOUS
  Administered 2017-05-24: 8 [IU] via SUBCUTANEOUS
  Administered 2017-05-25 (×2): 2 [IU] via SUBCUTANEOUS

## 2017-05-22 MED ORDER — HEPARIN (PORCINE) IN NACL 100-0.45 UNIT/ML-% IJ SOLN
1700.0000 [IU]/h | INTRAMUSCULAR | Status: DC
  Administered 2017-05-22: 1400 [IU]/h via INTRAVENOUS
  Filled 2017-05-22: qty 250

## 2017-05-22 MED ORDER — PANTOPRAZOLE SODIUM 40 MG IV SOLR
40.0000 mg | Freq: Every day | INTRAVENOUS | Status: DC
  Administered 2017-05-22: 40 mg via INTRAVENOUS
  Filled 2017-05-22: qty 40

## 2017-05-22 MED ORDER — FENTANYL CITRATE (PF) 100 MCG/2ML IJ SOLN
INTRAMUSCULAR | Status: DC | PRN
  Administered 2017-05-22: 100 ug via INTRAVENOUS

## 2017-05-22 MED ORDER — AMIODARONE HCL IN DEXTROSE 360-4.14 MG/200ML-% IV SOLN
60.0000 mg/h | INTRAVENOUS | Status: AC
  Filled 2017-05-22: qty 200

## 2017-05-22 MED ORDER — TICAGRELOR 60 MG PO TABS
60.0000 mg | ORAL_TABLET | Freq: Two times a day (BID) | ORAL | Status: DC
  Administered 2017-05-22 – 2017-05-24 (×4): 60 mg
  Filled 2017-05-22 (×5): qty 1

## 2017-05-22 MED ORDER — ORAL CARE MOUTH RINSE
15.0000 mL | OROMUCOSAL | Status: DC
  Administered 2017-05-23 (×2): 15 mL via OROMUCOSAL

## 2017-05-22 MED ORDER — MIDAZOLAM HCL 5 MG/5ML IJ SOLN
INTRAMUSCULAR | Status: DC | PRN
  Administered 2017-05-22: 2 mg via INTRAVENOUS

## 2017-05-22 MED ORDER — FENTANYL BOLUS VIA INFUSION
25.0000 ug | INTRAVENOUS | Status: DC | PRN
  Filled 2017-05-22: qty 25

## 2017-05-22 MED ORDER — MIDAZOLAM HCL 2 MG/2ML IJ SOLN
1.0000 mg | INTRAMUSCULAR | Status: DC | PRN
  Administered 2017-05-22: 1 mg via INTRAVENOUS
  Filled 2017-05-22: qty 2

## 2017-05-22 MED ORDER — FENTANYL CITRATE (PF) 100 MCG/2ML IJ SOLN
INTRAMUSCULAR | Status: AC
  Filled 2017-05-22: qty 2

## 2017-05-22 MED ORDER — SUCCINYLCHOLINE CHLORIDE 20 MG/ML IJ SOLN
INTRAMUSCULAR | Status: AC | PRN
  Administered 2017-05-22: 100 mg via INTRAVENOUS

## 2017-05-22 MED ORDER — HEPARIN SODIUM (PORCINE) 5000 UNIT/ML IJ SOLN: 5000.0000 [IU] | Freq: Three times a day (TID) | INTRAMUSCULAR | Status: DC

## 2017-05-22 MED ORDER — SODIUM CHLORIDE 0.9 % IV SOLN: 250.0000 mL | INTRAVENOUS | Status: DC | PRN

## 2017-05-22 MED ORDER — AMIODARONE HCL IN DEXTROSE 360-4.14 MG/200ML-% IV SOLN
30.0000 mg/h | INTRAVENOUS | Status: DC
  Administered 2017-05-23: 30 mg/h via INTRAVENOUS

## 2017-05-22 MED ORDER — FENTANYL CITRATE (PF) 100 MCG/2ML IJ SOLN
50.0000 ug | Freq: Once | INTRAMUSCULAR | Status: AC
  Administered 2017-05-22: 50 ug via INTRAVENOUS
  Filled 2017-05-22: qty 2

## 2017-05-22 MED ORDER — MIDAZOLAM HCL 2 MG/2ML IJ SOLN
INTRAMUSCULAR | Status: AC
  Filled 2017-05-22: qty 2

## 2017-05-22 MED ORDER — MIDAZOLAM HCL 2 MG/2ML IJ SOLN
2.0000 mg | INTRAMUSCULAR | Status: DC | PRN
  Filled 2017-05-22 (×2): qty 2

## 2017-05-22 MED ORDER — ETOMIDATE 2 MG/ML IV SOLN
INTRAVENOUS | Status: AC | PRN
  Administered 2017-05-22: 10 mg via INTRAVENOUS
  Administered 2017-05-22: 20 mg via INTRAVENOUS

## 2017-05-22 MED ORDER — AMIODARONE HCL 200 MG PO TABS: 200.0000 mg | ORAL_TABLET | Freq: Every day | ORAL | Status: DC

## 2017-05-22 MED ORDER — SODIUM CHLORIDE 0.9 % IV SOLN
INTRAVENOUS | Status: DC
  Administered 2017-05-22 (×2): via INTRAVENOUS

## 2017-05-22 MED ORDER — FENTANYL CITRATE (PF) 100 MCG/2ML IJ SOLN
100.0000 ug | INTRAMUSCULAR | Status: DC | PRN
  Administered 2017-05-22 (×2): 100 ug via INTRAVENOUS
  Filled 2017-05-22 (×2): qty 2

## 2017-05-22 MED ORDER — FUROSEMIDE 10 MG/ML IJ SOLN
40.0000 mg | Freq: Once | INTRAMUSCULAR | Status: AC
  Administered 2017-05-22: 40 mg via INTRAVENOUS
  Filled 2017-05-22: qty 4

## 2017-05-22 MED ORDER — PRAVASTATIN SODIUM 20 MG PO TABS
20.0000 mg | ORAL_TABLET | Freq: Every day | ORAL | Status: DC
  Administered 2017-05-23 – 2017-05-26 (×4): 20 mg via ORAL
  Filled 2017-05-22 (×4): qty 1

## 2017-05-22 MED ORDER — ONDANSETRON HCL 4 MG/2ML IJ SOLN
4.0000 mg | Freq: Once | INTRAMUSCULAR | Status: AC
  Administered 2017-05-22: 4 mg via INTRAVENOUS
  Filled 2017-05-22: qty 2

## 2017-05-22 NOTE — ED Notes (Signed)
The pt has started having bright red blood in his foley bag

## 2017-05-22 NOTE — Code Documentation (Signed)
Intubated with 7.5 tube 25 at lip equal  Breath sounds

## 2017-05-22 NOTE — ED Notes (Signed)
Pa for critical care here  Dr Johnsie Cancel is also here to see the pt

## 2017-05-22 NOTE — Progress Notes (Signed)
Patient transported to 5T91 without complication.

## 2017-05-22 NOTE — ED Notes (Signed)
Pt vomited approx 200 ml of undigested food  Suctioned  The pt had eaten 15 mminutes before the episode that brought him in

## 2017-05-22 NOTE — ED Notes (Signed)
The wife reports that in march he had a pacing out episode and they placed the defrib then

## 2017-05-22 NOTE — Code Documentation (Signed)
Temp- 96.3

## 2017-05-22 NOTE — ED Notes (Signed)
Cards here

## 2017-05-22 NOTE — Progress Notes (Addendum)
ANTICOAGULATION CONSULT NOTE - Initial Consult  Pharmacy Consult for heparin (PTA Xarelto on hold) Indication: atrial fibrillation  Allergies  Allergen Reactions  . Plavix [Clopidogrel]     Nose bleeds, swellings, whelps on legs & back, itching  . Lipitor [Atorvastatin] Other (See Comments)    REACTION: sore legs  . Crestor [Rosuvastatin Calcium] Other (See Comments)    Myalgias, Interfering with Gait  . Entresto [Sacubitril-Valsartan]     Angioedema  . Ancef [Cefazolin] Rash    Describes itching and rash, but said 'it wasn't that bad'    Patient Measurements: Height: 5\' 10"  (177.8 cm) Weight: 228 lb (103.4 kg) IBW/kg (Calculated) : 73 Heparin Dosing Weight: 95 kg   Vital Signs: Temp: 95.7 F (35.4 C) (06/08 1930) BP: 131/69 (06/08 1930) Pulse Rate: 60 (06/08 1930)  Labs:  Recent Labs  05/22/17 1553 05/22/17 1835 05/22/17 1928  HGB 12.0*  --   --   HCT 37.3*  --   --   PLT 158  --   --   APTT  --   --  35  LABPROT 23.5*  --   --   INR 2.06  --   --   HEPARINUNFRC  --  >2.10*  --   CREATININE 1.77*  --   --   TROPONINI  --   --  0.20*    Estimated Creatinine Clearance: 44.8 mL/min (A) (by C-G formula based on SCr of 1.77 mg/dL (H)).   Medical History: Past Medical History:  Diagnosis Date  . Adenocarcinoma of prostate ([place])    s/p seed implants  . Arthritis   . Cellulitis of left leg    a. 01/7061 complicated by septic shock  . Chronic combined systolic and diastolic CHF (congestive heart failure) ([place])   . Colon polyps   . CORONARY ATHEROSCLEROSIS NATIVE CORONARY ARTERY    a. 01/2011 Cath/PCI: LM nl, LAD 40-50p, D1 80-small, LCX 95-small, RI 90, RCA 100, EF 20%;  b. 01/2011 Card MRI - No transmural scar;  c. 01/2011 PCI RCA->5 Promus DES, RI->3.0x16 Promus DES; d. Cath 01/13/13 patent LAD & Ramus, diffuse LCx dz, RCA mult overlapping stents w/ 95% osital stenosis, EF 20%, s/p DES to ostial/prox RCA 01/24/13   . Diabetes mellitus, type 2 ([place])   . GERD  (gastroesophageal reflux disease)   . Hematoma of leg    a. left leg hematoma 03/2012 in the setting of asa/effient  . Herpes zoster ophthalmicus   . HYPERLIPIDEMIA    intolerant to Lipitor (myalgias)  . HYPERTENSION   . Ischemic cardiomyopathy    a. 01/2012 Echo EF 45%, mild LVH; b. BJ62%, grade 1 diastolic dysfunction, diffuse hypokinesis, inferoposterior akinesis   . Noncompliance   . Obesity   . OSA (obstructive sleep apnea)   . Polymyalgia rheumatica (HCC)     Assessment: 76 yo admitted s/p VF arrest. On Xarelto for afib per PTA records, however, unable to confirm last dose with patient or family. Pharmacy consulted to dose heparin gtt while Xarelto on hold in setting of cardiac arrest. Baseline aPTT normal at 35 and baseline heparin level elevated at >2.1 (influenced by Xarelto).   Will begin heparin gtt without bolus and follow both heparin levels and aPTTs, until correlating. CBC stable. No overt s/s bleeding noted.   Goal of Therapy:  Heparin level 0.3-0.7 units/ml  aPTT 66-102s  Monitor platelets by anticoagulation protocol: Yes   Plan:  Start heparin gtt at 1400 units/hr  Heparin level/aPTT in 8 hours  Daily heparin level, aPTT, and CBC Monitor for s/s bleeding F/u long-term [facility] plans   Argie Ramming, PharmD Pharmacy Resident  Pager [phone] 05/22/17 8:47 PM

## 2017-05-22 NOTE — Code Documentation (Signed)
Portable chest x-ray

## 2017-05-22 NOTE — ED Provider Notes (Signed)
[facility] DEPT Provider Note   CSN: [id number] Arrival date & time: 05/22/17  1531     History   Chief Complaint Chief Complaint  Patient presents with  . Cardiac Arrest    HPI Wayne [name] is a 76 y.o. male.   Cardiac Arrest  Witnessed by:  Family member Time since incident:  30 minutes Time before BLS initiated:  Immediate Condition upon EMS arrival: Cyanotic. Pulse:  Present (When fire arrived, initial rhythm V. tach with pulses. Synchronized cardioversion. Lost pulses, VT. Immediate CRP) Treatments prior to arrival:  ACLS protocol Airway: King airway. Rhythm on admission to ED:  Normal sinus Associated symptoms comment:  Wife denies preceding symptoms. Does not move a couch, stated that he didn't feel well and to hold on and then collapsed.  Risk factors: heart problem       Past Medical History:  Diagnosis Date  . Adenocarcinoma of prostate ([place])    s/p seed implants  . Arthritis   . Cellulitis of left leg    a. 07/5276 complicated by septic shock  . Chronic combined systolic and diastolic CHF (congestive heart failure) ([place])   . Colon polyps   . CORONARY ATHEROSCLEROSIS NATIVE CORONARY ARTERY    a. 01/2011 Cath/PCI: LM nl, LAD 40-50p, D1 80-small, LCX 95-small, RI 90, RCA 100, EF 20%;  b. 01/2011 Card MRI - No transmural scar;  c. 01/2011 PCI RCA->5 Promus DES, RI->3.0x16 Promus DES; d. Cath 01/13/13 patent LAD & Ramus, diffuse LCx dz, RCA mult overlapping stents w/ 95% osital stenosis, EF 20%, s/p DES to ostial/prox RCA 01/24/13   . Diabetes mellitus, type 2 ([place])   . GERD (gastroesophageal reflux disease)   . Hematoma of leg    a. left leg hematoma 03/2012 in the setting of asa/effient  . Herpes zoster ophthalmicus   . HYPERLIPIDEMIA    intolerant to Lipitor (myalgias)  . HYPERTENSION   . Ischemic cardiomyopathy    a. 01/2012 Echo EF 45%, mild LVH; b. OE42%, grade 1 diastolic dysfunction, diffuse hypokinesis, inferoposterior akinesis   . Noncompliance   .  Obesity   . OSA (obstructive sleep apnea)   . Polymyalgia rheumatica [facility])     Patient Active Problem List   Diagnosis Date Noted  . Cardiac arrest ([place]) 05/22/2017  . Defibrillator discharge 02/11/2017  . Atrial fibrillation with rapid ventricular response ([place]) 01/16/2017  . Coronary stent occlusion 11/18/2016  . Type 2 diabetes mellitus, uncontrolled ([place]) 09/30/2016  . CKD (chronic kidney disease) stage 3, GFR 30-59 ml/min 09/30/2016  . CAD (coronary artery disease) 11/20/2015  . Leucocytoclastic vasculitis ([place]) 06/25/2012  . AKI (acute kidney injury) ([place]) 06/22/2012  . Staphylococcus aureus bacteremia with sepsis ([place]) 06/18/2012  . NSTEMI, initial episode of care ([place]) 06/16/2012  . Cellulitis and abscess of lower leg 06/14/2012    Class: Acute  . Healthcare-associated pneumonia 06/14/2012    Class: Acute  . Hyperglycemia 06/14/2012    Class: Acute  . Leg swelling 04/22/2012  . Obesity 01/08/2012  . OSA (obstructive sleep apnea) 09/02/2011  . Systolic CHF, chronic ([place]) 06/10/2011  . Polymyalgia rheumatica ([place]) 06/02/2011  . HEMATOCHEZIA 02/10/2011  . BURSITIS, LEFT HIP 02/10/2011  . CORONARY ATHEROSCLEROSIS NATIVE CORONARY ARTERY 01/27/2011  . CARDIOMYOPATHY 01/21/2011  . CHEST PAIN UNSPECIFIED 01/20/2011  . DYSPNEA 01/16/2011  . ABDOMINAL PAIN, UNSPECIFIED SITE 01/13/2011  . ECZEMA 01/28/2010  . BRONCHITIS, ACUTE WITH MILD BRONCHOSPASM 12/21/2009  . HERPES ZOSTER OPHTHALMICUS 07/19/2009  . ADENOCARCINOMA, PROSTATE 06/20/2009  .  Secondary DM with peripheral vascular disease, uncontrolled ([place]) 06/20/2009  . Hyperlipidemia 06/20/2009  . Essential hypertension 06/20/2009  . HYPERTROPHY PROSTATE W/UR OBST & OTH LUTS 06/20/2009    Past Surgical History:  Procedure Laterality Date  . CARDIAC CATHETERIZATION  01/24/2013  . CARDIAC CATHETERIZATION N/A 11/14/2016   Procedure: Right/Left Heart Cath and Coronary Angiography;  Surgeon: Larey Dresser, MD;  Location:  [facility] CV LAB;  Service: Cardiovascular;  Laterality: N/A;  . CARDIAC CATHETERIZATION N/A 11/17/2016   Procedure: Coronary Stent Intervention w/Impella;  Surgeon: Peter M Martinique, MD;  Location: [facility] CV LAB;  Service: Cardiovascular;  Laterality: N/A;  . CARDIAC CATHETERIZATION N/A 11/17/2016   Procedure: Coronary Atherectomy;  Surgeon: Peter M Martinique, MD;  Location: [facility] CV LAB;  Service: Cardiovascular;  Laterality: N/A;  . CORONARY ANGIOPLASTY WITH STENT PLACEMENT  01/24/2013   DES to RCA  . CORONARY STENT PLACEMENT  2012   reports 6 stents placed  . I&D EXTREMITY  06/15/2012   Procedure: IRRIGATION AND DEBRIDEMENT EXTREMITY;  Surgeon: Newt Minion, MD;  Location: [facility];  Service: Orthopedics;  Laterality: Left;  I&D Left Posterior Knee  . I&D EXTREMITY  06/30/2012   Procedure: IRRIGATION AND DEBRIDEMENT EXTREMITY;  Surgeon: Newt Minion, MD;  Location: [facility];  Service: Orthopedics;  Laterality: Left;  Left Leg Irrigation and Debridement and placement of Wound VAC and application of  A-cell  . I&D EXTREMITY  07/20/2012   Procedure: IRRIGATION AND DEBRIDEMENT EXTREMITY;  Surgeon: Newt Minion, MD;  Location: [facility];  Service: Orthopedics;  Laterality: Left;  Irrigation and Debridement Left Leg and Place antibiotic beads   . ICD IMPLANT N/A 02/12/2017   Procedure: ICD Implant;  Surgeon: Evans Lance, MD;  Location: [facility] CV LAB;  Service: Cardiovascular;  Laterality: N/A;  . PERCUTANEOUS CORONARY STENT INTERVENTION (PCI-S) N/A 01/24/2013   Procedure: PERCUTANEOUS CORONARY STENT INTERVENTION (PCI-S);  Surgeon: Wellington Hampshire, MD;  Location: [facility] CATH [facility];  Service: Cardiovascular;  Laterality: N/A;  . PROSTATE SURGERY     cancer, seed implant  . ULTRASOUND GUIDANCE FOR VASCULAR ACCESS  11/14/2016   Procedure: Ultrasound Guidance For Vascular Access;  Surgeon: Larey Dresser, MD;  Location: [facility] CV LAB;  Service: Cardiovascular;;       Home Medications    Prior  to Admission medications   Medication Sig Start Date End Date Taking? Authorizing Provider  acetaminophen (TYLENOL) 500 MG tablet Take 500 mg by mouth daily as needed for moderate pain or headache.    [provider]  amiodarone (PACERONE) 200 MG tablet 200 mg BID twice daily until 02/18/17, Then take 200 mg daily. 02/13/17   Shirley Friar, PA-C  carvedilol (COREG) 12.5 MG tablet Take 1 tablet (12.5 mg total) by mouth 2 (two) times daily. 04/27/17   Larey Dresser, MD  digoxin (LANOXIN) 0.125 MG tablet Take 0.5 tablets (0.0625 mg total) by mouth daily. 02/13/17   Shirley Friar, PA-C  diphenhydramine-acetaminophen (TYLENOL PM) 25-500 MG TABS tablet Take 1 tablet by mouth at bedtime as needed (sleep).    [provider]  Evolocumab (REPATHA SURECLICK) 433 MG/ML SOAJ Inject 1 pen into the skin every 14 (fourteen) days.    [provider]  furosemide (LASIX) 20 MG tablet Take 4 tablets (80 mg total) by mouth daily. 02/13/17   Shirley Friar, PA-C  glimepiride (AMARYL) 2 MG tablet TAKE 1 TABLET(2 MG) BY MOUTH DAILY BEFORE BREAKFAST 02/23/17  Burchette, Alinda Sierras, MD  Lancets (ACCU-CHEK SOFT TOUCH) lancets Check blood sugars 1-2 times per day. DX: E11.65 09/30/16   Burchette, Alinda Sierras, MD  losartan (COZAAR) 25 MG tablet Take 1 tablet (25 mg total) by mouth 2 (two) times daily. 02/23/17   Larey Dresser, MD  Multiple Vitamins-Minerals (CENTRUM SILVER ADULT 50+) TABS Take 1 tablet by mouth daily.    [provider]  Omega-3 Fatty Acids (FISH OIL) 1000 MG CAPS Take 2,000 mg by mouth daily.    [provider]  pravastatin (PRAVACHOL) 20 MG tablet Take 1 tablet (20 mg total) by mouth every evening. 01/23/17 04/23/17  Larey Dresser, MD  Rivaroxaban (XARELTO) 15 MG TABS tablet Take 1 tablet (15 mg total) by mouth daily. 03/11/17   Larey Dresser, MD  spironolactone (ALDACTONE) 25 MG tablet Take 0.5 tablets (12.5 mg total) by mouth daily. 02/13/17    Shirley Friar, PA-C  ticagrelor (BRILINTA) 60 MG TABS tablet Take 1 tablet (60 mg total) by mouth 2 (two) times daily. 01/17/17   Rogelia Mire, NP  traMADol (ULTRAM) 50 MG tablet Take 1 tablet (50 mg total) by mouth every 6 (six) hours as needed for moderate pain (at device insertion site). 02/13/17   Shirley Friar, PA-C    Family History Family History  Problem Relation Age of Onset  . Cancer Mother 24       unknown CA  . Heart disease Father   . Alcohol abuse Father   . Heart attack Father 46    Social History Social History  Substance Use Topics  . Smoking status: Former Smoker    Packs/day: 0.30    Years: 20.00    Types: Cigarettes    Quit date: 02/23/1989  . Smokeless tobacco: Never Used  . Alcohol use No     Allergies   Plavix [clopidogrel]; Lipitor [atorvastatin]; Crestor [rosuvastatin calcium]; Entresto [sacubitril-valsartan]; and Ancef [cefazolin]   Review of Systems Review of Systems  Unable to perform ROS: Intubated     Physical Exam Updated Vital Signs BP 129/62 (BP Location: Left Arm)   Pulse 66   Temp 97 F (36.1 C)   Resp 18   Ht 5\' 10"  (1.778 m)   Wt 105.3 kg (232 lb 2.3 oz)   SpO2 100%   BMI 33.31 kg/m   Physical Exam  Constitutional: He appears well-developed and well-nourished.  HENT:  Head: Atraumatic.  Eyes: Pupils are equal, round, and reactive to light.  Neck: No JVD present. No tracheal deviation present.  Cardiovascular: Normal rate, regular rhythm and intact distal pulses.   No murmur heard. ICD, left chest wall  Pulmonary/Chest:  Intubated with 7.5 ETT, 25 lips. Coarse breath sounds throughout. No wheezing.  Abdominal: Soft.  Musculoskeletal: Normal range of motion. He exhibits no edema.  Neurological:  GCS 3T  Skin: Skin is warm. Capillary refill takes less than 2 seconds.  Psychiatric: He has a normal mood and affect.     ED Treatments / Results  Labs (all labs ordered are listed, but only  abnormal results are displayed) Labs Reviewed  CBC WITH DIFFERENTIAL/PLATELET - Abnormal; Notable for the following:       Result Value   RBC 4.17 (*)    Hemoglobin 12.0 (*)    HCT 37.3 (*)    All other components within normal limits  COMPREHENSIVE METABOLIC PANEL - Abnormal; Notable for the following:    Glucose, Bld 300 (*)    BUN 34 (*)  Creatinine, Ser 1.77 (*)    Albumin 3.4 (*)    AST 62 (*)    GFR calc non Af Amer 36 (*)    GFR calc Af Amer 42 (*)    All other components within normal limits  PROTIME-INR - Abnormal; Notable for the following:    Prothrombin Time 23.5 (*)    All other components within normal limits  URINALYSIS, ROUTINE W REFLEX MICROSCOPIC - Abnormal; Notable for the following:    Glucose, UA 150 (*)    Hgb urine dipstick SMALL (*)    Squamous Epithelial / LPF 0-5 (*)    All other components within normal limits  HEPARIN LEVEL (UNFRACTIONATED) - Abnormal; Notable for the following:    Heparin Unfractionated >2.10 (*)    All other components within normal limits  TROPONIN I - Abnormal; Notable for the following:    Troponin I 0.20 (*)    All other components within normal limits  GLUCOSE, CAPILLARY - Abnormal; Notable for the following:    Glucose-Capillary 262 (*)    All other components within normal limits  I-STAT CG4 LACTIC ACID, ED - Abnormal; Notable for the following:    Lactic Acid, Venous 3.17 (*)    All other components within normal limits  I-STAT ARTERIAL BLOOD GAS, ED - Abnormal; Notable for the following:    pO2, Arterial 201.0 (*)    All other components within normal limits  URINE CULTURE  CULTURE, BLOOD (ROUTINE X 2)  CULTURE, BLOOD (ROUTINE X 2)  CULTURE, RESPIRATORY (NON-EXPECTORATED)  URINE CULTURE  MRSA PCR SCREENING  AMMONIA  APTT  CBC  BASIC METABOLIC PANEL  BLOOD GAS, ARTERIAL  MAGNESIUM  PHOSPHORUS  TROPONIN I  HEMOGLOBIN A1C  HEPARIN LEVEL (UNFRACTIONATED)  APTT  I-STAT TROPOININ, ED    EKG  EKG  Interpretation  Date/Time:  Friday May 22 2017 15:34:07 EDT Ventricular Rate:  75 PR Interval:    QRS Duration: 169 QT Interval:  460 QTC Calculation: 514 R Axis:   -80 Text Interpretation:  Sinus rhythm RBBB and LAFB Baseline wander in lead(s) V6 lafb  Confirmed by Thomasene Lot [place] [phone]) on 05/22/2017 3:54:05 PM       Radiology Ct Head Wo Contrast  Result Date: 05/22/2017 CLINICAL DATA:  Fall. EXAM: CT HEAD WITHOUT CONTRAST TECHNIQUE: Contiguous axial images were obtained from the base of the skull through the vertex without intravenous contrast. COMPARISON:  02/11/2017 head CT. FINDINGS: Brain: No evidence of parenchymal hemorrhage or extra-axial fluid collection. No mass lesion, mass effect, or midline shift. No CT evidence of acute infarction. Cerebral volume is age appropriate. No ventriculomegaly. Vascular: No hyperdense vessel or unexpected calcification. Skull: No evidence of calvarial fracture. Sinuses/Orbits: No fluid levels. Mucoperiosteal thickening in the bilateral ethmoidal air cells and sphenoid sinus. Other: Small right parietal scalp contusion. The mastoid air cells are unopacified. IMPRESSION: 1. Small right parietal scalp contusion. 2. No evidence of acute intracranial abnormality. No evidence of calvarial fracture. 3. Mild chronic appearing paranasal sinusitis. Electronically Signed   By: Ilona Sorrel M.D.   On: 05/22/2017 17:00   Dg Chest Portable 1 View  Result Date: 05/22/2017 CLINICAL DATA:  Intubation.  Trauma. EXAM: PORTABLE CHEST 1 VIEW COMPARISON:  02/23/2017. FINDINGS: Endotracheal tube noted with its tip at the carina. Proximal repositioning of approximately 3 cm suggested. AICD noted in stable position. Stable cardiomegaly. Mild pulmonary vascular congestion. Mild interstitial prominence. Mild component of CHF cannot be excluded. Small left pleural effusion. Low lung volumes. IMPRESSION: 1.  Endotracheal tube noted with its tip at the carina pre and for  repositioning of approximately 3 cm should be considered. 2. AICD in stable position. Mild changes of congestive heart failure with mild interstitial edema small left pleural effusion. Low lung volumes. Critical Value/emergent results were called by telephone at the time of interpretation on 05/22/2017 at 3:56 pm to nurse Claiborne Billings, who verbally acknowledged these results.a Electronically Signed   By: Marcello Moores  Register   On: 05/22/2017 15:58    Procedures Procedure Name: Intubation Date/Time: 05/22/2017 11:02 PM Performed by: Nathaniel Man Pre-anesthesia Checklist: Timeout performed Oxygen Delivery Method: Non-rebreather mask Preoxygenation: Pre-oxygenation with 100% oxygen Intubation Type: Rapid sequence Laryngoscope Size: Glidescope and 4 Grade View: Grade I Tube size: 7.5 mm Number of attempts: 1 Airway Equipment and Method: Rigid stylet Placement Confirmation: ETT inserted through vocal cords under direct vision Secured at: 25 cm Tube secured with: ETT holder Difficulty Due To: Difficulty was anticipated      (including critical care time)  Medications Ordered in ED Medications  fentaNYL (SUBLIMAZE) 100 MCG/2ML injection (not administered)  midazolam (VERSED) 2 MG/2ML injection (not administered)  0.9 %  sodium chloride infusion ( Intravenous Rate/Dose Verify 05/22/17 2100)  pantoprazole (PROTONIX) injection 40 mg (40 mg Intravenous Given 05/22/17 2218)  0.9 %  sodium chloride infusion (not administered)  fentaNYL 2553mcg in NS 271mL (57mcg/ml) infusion-PREMIX (50 mcg/hr Intravenous Rate/Dose Verify 05/22/17 2100)  fentaNYL (SUBLIMAZE) bolus via infusion 25 mcg (not administered)  midazolam (VERSED) injection 1 mg (1 mg Intravenous Given 05/22/17 1848)  midazolam (VERSED) injection 1 mg (not administered)  ticagrelor (BRILINTA) tablet 60 mg (60 mg Per Tube Given 05/22/17 2205)  pravastatin (PRAVACHOL) tablet 20 mg (not administered)  insulin aspart (novoLOG) injection 0-15 Units (8 Units  Subcutaneous Given 05/22/17 2034)  amiodarone (NEXTERONE PREMIX) 360-4.14 MG/200ML-% (1.8 mg/mL) IV infusion (not administered)  amiodarone (NEXTERONE PREMIX) 360-4.14 MG/200ML-% (1.8 mg/mL) IV infusion (not administered)  heparin ADULT infusion 100 units/mL (25000 units/264mL sodium chloride 0.45%) (1,400 Units/hr Intravenous New Bag/Given 05/22/17 2205)  etomidate (AMIDATE) injection (20 mg Intravenous Given 05/22/17 1535)  succinylcholine (ANECTINE) injection (100 mg Intravenous Given 05/22/17 1534)  fentaNYL (SUBLIMAZE) injection 50 mcg (50 mcg Intravenous Given 05/22/17 1915)  furosemide (LASIX) injection 40 mg (40 mg Intravenous Given 05/22/17 2038)  ondansetron (ZOFRAN) injection 4 mg (4 mg Intravenous Given 05/22/17 1855)     Initial Impression / Assessment and Plan / ED Course  I have reviewed the triage vital signs and the nursing notes.  Pertinent labs & imaging results that were available during my care of the patient were reviewed by me and considered in my medical decision making (see chart for details).     Patient is a 76 year old male postural history significant for coronary artery disease, paroxysmal A. fib, Medtronic ICD in place, CHF, who presented to the emergency department after having a witnessed arrest. Initial rhythm V. tach with a pulse, received 1 syncopized cardioversion. Patient then became pulseless, initial rhythm V. tach. Received one defibrillation, CPR for approximately 6 minutes with ROSC. Down time approx 1 min.  King airway in place prior to arrival.  On arrival GCS 3T. ABCs intact. Patient's King airway was removed and patient was intubated for airway protection, see procedure note above for details. Initial BP 130s/70s. EKG showed normal sinus rhythm, right bundle branch block, changed morphology of anterior leads. No acute ischemia or ST elevation.  Medtronic ICD was interrogated, developed but ICD did not initially fire. Labwork markable for  troponin 0.04. Lactic  acid 3.17. No leukocytosis. Hemoglobin at baseline. No significant electrolyte abnormalities. CT head showed no acute findings. Chest x-ray showed no signs of pneumonia or pneumothorax. Patient's wife denied any preceding symptoms of chest pain or dyspnea. Patient was likely had an arrhythmia with inadequate defibrillation by his ICD. Cardiology was consult. In the emergency department. Do not feel that the patient needs active cooling, is currently undergoing intermittent sedation but is starting to wake up and move extremities. Currently normothermic.  Patient minute to ICU for further management.  Final Clinical Impressions(s) / ED Diagnoses   Final diagnoses:  Malignant neoplasm of prostate ([place])  Uncontrolled type 2 diabetes mellitus with complication, without long-term current use of insulin (HCC)  Leucocytoclastic vasculitis (H[facility])  Systolic CHF, chronic (H[facility])  Atrial fibrillation with rapid ventricular response [facility])    New Prescriptions Current Discharge Medication List       Nathaniel Man, MD 05/22/17 2307    Macarthur Critchley, MD 05/25/17 [phone]

## 2017-05-22 NOTE — Code Documentation (Signed)
Off lucas

## 2017-05-22 NOTE — ED Notes (Signed)
The family and spoken with dr Thomasene Lot    Medication given to make him more comfortable  Lying with fists clenched.

## 2017-05-22 NOTE — ED Notes (Signed)
Wife at the bedside  Pt has a gag reflex

## 2017-05-22 NOTE — Progress Notes (Signed)
[facility] Progress Note Patient Name: Wayne [name] DOB: 04-22-1941 MRN: [id number]   Date of Service  05/22/2017  HPI/Events of Note  Troponin = 0.2 - s/p cardiac arrest. Cardiology already following.   eICU Interventions  Continue to trend Troponin.      Intervention Category Intermediate Interventions: Diagnostic test evaluation  Galen Russman Cornelia Copa 05/22/2017, 8:52 PM

## 2017-05-22 NOTE — H&P (Signed)
PULMONARY / CRITICAL CARE MEDICINE   Name: Wayne [name] MRN: [id number] DOB: 09/06/1941    ADMISSION DATE:  05/22/2017 CONSULTATION DATE:  05/22/17  REFERRING MD:  Thomasene Lot  CHIEF COMPLAINT:  Cardiac Arrest  HISTORY OF PRESENT ILLNESS:  Pt is encephelopathic; therefore, this HPI is obtained from chart review. Wayne [name] is a 76 y.o. male with PMH as outlined below including but not limited to ischemic cardiomyopathy (echo from March 2019 with EF 20-25%, G1DD) s/p ICD.  He presented to [facility] ED 6/8 cardiac arrest.  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 Per wife, he was pushing a couch and then stopped and said "wait a minute" before he became pale and collapsed. Wife was able to catch him and break his fall. She states that he was breathing however was unresponsive. EMS happened to be outside of the store; therefore, she asked [place] to associate to run and get them. While waiting for EMS, wife states that he took to pick breaths and then started turning blue in the neck and face. Upon EMS arrival, he was noted to have VT rhythm and as they were about to begin CPR, his ICD fired with successful conversion to NSR.  Downtime of roughly 1 minute  Per wife, he was in his usual state of health prior to this. He had not had any recent illness, had not complained of any symptoms.  In ED, he was intubated for airway protection and PCCM was called for admission.  Just prior to my assessment, patient had received PRN Fentanyl and Versed; therefore, neurological exam was difficult to interpret. However, while I was discussing his case and prognosis with family, patient began to come around. He had his eyes open and was looking around the room.  He was able to follow basic commands (squeezed my fingers with bilateral hands, wiggle toes).  Based on his ability to follow commands, decision was made to defer cooling.  Device currently being interrogated by cardiology. Their  initial thoughts was that patient had developed VT with a slower heart rate then ICD was sent to fire at; therefore, there was a delayed response time in ICD firing.   PAST MEDICAL HISTORY :  He  has a past medical history of Adenocarcinoma of prostate ([place]); Arthritis; Cellulitis of left leg; Chronic combined systolic and diastolic CHF (congestive heart failure) ([place]); Colon polyps; CORONARY ATHEROSCLEROSIS NATIVE CORONARY ARTERY; Diabetes mellitus, type 2 ([place]); GERD (gastroesophageal reflux disease); Hematoma of leg; Herpes zoster ophthalmicus; HYPERLIPIDEMIA; HYPERTENSION; Ischemic cardiomyopathy; Noncompliance; Obesity; OSA (obstructive sleep apnea); and Polymyalgia rheumatica ([place]).  PAST SURGICAL HISTORY: He  has a past surgical history that includes Prostate surgery; Coronary stent placement (2012); I&D extremity (06/15/2012); I&D extremity (06/30/2012); I&D extremity (07/20/2012); Cardiac catheterization (01/24/2013); Coronary angioplasty with stent (01/24/2013); percutaneous coronary stent intervention (pci-s) (N/A, 01/24/2013); Cardiac catheterization (N/A, 11/14/2016); Ultrasound guidance for vascular access (11/14/2016); Cardiac catheterization (N/A, 11/17/2016); Cardiac catheterization (N/A, 11/17/2016); and ICD Implant (N/A, 02/12/2017).  Allergies  Allergen Reactions  . Plavix [Clopidogrel]     Nose bleeds, swellings, whelps on legs & back, itching  . Lipitor [Atorvastatin] Other (See Comments)    REACTION: sore legs  . Crestor [Rosuvastatin Calcium] Other (See Comments)    Myalgias, Interfering with Gait  . Entresto [Sacubitril-Valsartan]     Angioedema  . Ancef [Cefazolin] Rash    Describes itching and rash, but said 'it wasn't that bad'    No current facility-administered medications on  file prior to encounter.    Current Outpatient Prescriptions on File Prior to Encounter  Medication Sig  . acetaminophen (TYLENOL) 500 MG tablet Take 500 mg by mouth daily as needed for moderate pain  or headache.  Marland Kitchen amiodarone (PACERONE) 200 MG tablet 200 mg BID twice daily until 02/18/17, Then take 200 mg daily.  . carvedilol (COREG) 12.5 MG tablet Take 1 tablet (12.5 mg total) by mouth 2 (two) times daily.  . digoxin (LANOXIN) 0.125 MG tablet Take 0.5 tablets (0.0625 mg total) by mouth daily.  . diphenhydramine-acetaminophen (TYLENOL PM) 25-500 MG TABS tablet Take 1 tablet by mouth at bedtime as needed (sleep).  . Evolocumab (REPATHA SURECLICK) 914 MG/ML SOAJ Inject 1 pen into the skin every 14 (fourteen) days.  . furosemide (LASIX) 20 MG tablet Take 4 tablets (80 mg total) by mouth daily.  Marland Kitchen glimepiride (AMARYL) 2 MG tablet TAKE 1 TABLET(2 MG) BY MOUTH DAILY BEFORE BREAKFAST  . Lancets (ACCU-CHEK SOFT TOUCH) lancets Check blood sugars 1-2 times per day. DX: E11.65  . losartan (COZAAR) 25 MG tablet Take 1 tablet (25 mg total) by mouth 2 (two) times daily.  . Multiple Vitamins-Minerals (CENTRUM SILVER ADULT 50+) TABS Take 1 tablet by mouth daily.  . Omega-3 Fatty Acids (FISH OIL) 1000 MG CAPS Take 2,000 mg by mouth daily.  . pravastatin (PRAVACHOL) 20 MG tablet Take 1 tablet (20 mg total) by mouth every evening.  . Rivaroxaban (XARELTO) 15 MG TABS tablet Take 1 tablet (15 mg total) by mouth daily.  Marland Kitchen spironolactone (ALDACTONE) 25 MG tablet Take 0.5 tablets (12.5 mg total) by mouth daily.  . ticagrelor (BRILINTA) 60 MG TABS tablet Take 1 tablet (60 mg total) by mouth 2 (two) times daily.  . traMADol (ULTRAM) 50 MG tablet Take 1 tablet (50 mg total) by mouth every 6 (six) hours as needed for moderate pain (at device insertion site).    FAMILY HISTORY:  His indicated that his mother is deceased. He indicated that his father is deceased.    SOCIAL HISTORY: He  reports that he quit smoking about 28 years ago. His smoking use included Cigarettes. He has a 6.00 pack-year smoking history. He has never used smokeless tobacco. He reports that he does not drink alcohol or use drugs.  REVIEW OF  SYSTEMS:  Unable to obtain as patient is encephalopathic.  SUBJECTIVE:  On vent, follows the commands.  VITAL SIGNS: BP 118/64   Pulse (!) 58   Temp (!) 95.2 F (35.1 C)   Resp 18   Ht 5\' 10"  (1.778 m)   Wt 103.4 kg (228 lb)   SpO2 99%   BMI 32.71 kg/m   HEMODYNAMICS:    VENTILATOR SETTINGS: Vent Mode: PRVC FiO2 (%):  [100 %] 100 % Set Rate:  [18 bmp] 18 bmp Vt Set:  [590 mL] 590 mL PEEP:  [5 cmH20] 5 cmH20 Plateau Pressure:  [18 cmH20] 18 cmH20  INTAKE / OUTPUT: No intake/output data recorded.   PHYSICAL EXAMINATION: General: Adult male, critically ill. Neuro: Sedated but able to follow basic commands. HEENT: [place]/AT. PERRL, sclerae anicteric. Cardiovascular: RRR, no M/R/G.  Lungs: Respirations even and unlabored.  Faint basilar crackles. Abdomen: BS x 4, soft, NT/ND.  Musculoskeletal: No gross deformities, no edema.  Skin: Intact, warm, no rashes.  LABS:  BMET  Recent Labs Lab 05/22/17 1553  NA 138  K 4.7  CL 106  CO2 24  BUN 34*  CREATININE 1.77*  GLUCOSE 300*    Electrolytes  Recent Labs Lab 05/22/17 1553  CALCIUM 8.9    CBC  Recent Labs Lab 05/22/17 1553  WBC 8.1  HGB 12.0*  HCT 37.3*  PLT 158    Coag's  Recent Labs Lab 05/22/17 1553  INR 2.06    Sepsis Markers  Recent Labs Lab 05/22/17 1559  LATICACIDVEN 3.17*    ABG  Recent Labs Lab 05/22/17 1655  PHART 7.395  PCO2ART 38.0  PO2ART 201.0*    Liver Enzymes  Recent Labs Lab 05/22/17 1553  AST 62*  ALT 57  ALKPHOS 70  BILITOT 0.9  ALBUMIN 3.4*    Cardiac Enzymes No results for input(s): TROPONINI, PROBNP in the last 168 hours.  Glucose No results for input(s): GLUCAP in the last 168 hours.  Imaging Ct Head Wo Contrast  Result Date: 05/22/2017 CLINICAL DATA:  Fall. EXAM: CT HEAD WITHOUT CONTRAST TECHNIQUE: Contiguous axial images were obtained from the base of the skull through the vertex without intravenous contrast. COMPARISON:  02/11/2017 head  CT. FINDINGS: Brain: No evidence of parenchymal hemorrhage or extra-axial fluid collection. No mass lesion, mass effect, or midline shift. No CT evidence of acute infarction. Cerebral volume is age appropriate. No ventriculomegaly. Vascular: No hyperdense vessel or unexpected calcification. Skull: No evidence of calvarial fracture. Sinuses/Orbits: No fluid levels. Mucoperiosteal thickening in the bilateral ethmoidal air cells and sphenoid sinus. Other: Small right parietal scalp contusion. The mastoid air cells are unopacified. IMPRESSION: 1. Small right parietal scalp contusion. 2. No evidence of acute intracranial abnormality. No evidence of calvarial fracture. 3. Mild chronic appearing paranasal sinusitis. Electronically Signed   By: Ilona Sorrel M.D.   On: 05/22/2017 17:00   Dg Chest Portable 1 View  Result Date: 05/22/2017 CLINICAL DATA:  Intubation.  Trauma. EXAM: PORTABLE CHEST 1 VIEW COMPARISON:  02/23/2017. FINDINGS: Endotracheal tube noted with its tip at the carina. Proximal repositioning of approximately 3 cm suggested. AICD noted in stable position. Stable cardiomegaly. Mild pulmonary vascular congestion. Mild interstitial prominence. Mild component of CHF cannot be excluded. Small left pleural effusion. Low lung volumes. IMPRESSION: 1. Endotracheal tube noted with its tip at the carina pre and for repositioning of approximately 3 cm should be considered. 2. AICD in stable position. Mild changes of congestive heart failure with mild interstitial edema small left pleural effusion. Low lung volumes. Critical Value/emergent results were called by telephone at the time of interpretation on 05/22/2017 at 3:56 pm to nurse Claiborne Billings, who verbally acknowledged these results.a Electronically Signed   By: [place]   On: 05/22/2017 15:58     STUDIES:  CT head 6/8 > no acute process. CXR 6/8 > mild interstitial edema. Echo 6/9 >   CULTURES: Blood 6/8 >  Sputum 6/8 >  Urine 6/8 >    ANTIBIOTICS: None.  SIGNIFICANT EVENTS: 6/8 > admitted after cardiac arrest.  LINES/TUBES: ETT 6/8 >   DISCUSSION: 76 y.o. male admitted 6/8 after VT cardiac arrest. Had delayed response of ICD firing (felt to be due to VT at slower rate than ICD was set to fire at). Required intubation in ED due to inability to protect airway. Cooling deferred given his ability to follow basic commands.  ASSESSMENT / PLAN:  PULMONARY A: Respiratory insufficiency - due to the inability to protect airway in setting of cardiac arrest. Mild interstitial edema. Hx OSA. P:   Full vent support. Wean as able. VAP prevention measures. SBT in AM if mental status allows. 40mg  lasix x 1. CXR in AM.  CARDIOVASCULAR A:  VT arrest - with downtime a roughly 1 minute before ICD fired. Hx ICM (echo from March 2019 with EF 20-25%, G1DD), HTN, HLD, PAF, CAD. P:  Defer cooling given patient's ability to follow commands. Trend troponins. Assess echo. Cardiology following. Heparin drip in lieu of preadmission xarelto. Continue amiodarone per cards. Continue preadmission brilinta, pravastatin. Hold preadmission carvedilol, digoxin, furosemide, losartan, rivaroxaban, spironolactone.  RENAL A:   AKI. P:   NS @ 75. BMP in AM.  GASTROINTESTINAL A:   GI prophylaxis. Nutrition. P:   SUP: Pantoprazole. NPO. Start TF's in AM if not extubated.  HEMATOLOGIC / ONCOLOGIC A:   VTE Prophylaxis. Hx adenocarcinoma of prostate - s/p seed implants. P:  SCD's / heparin gtt. CBC in AM.  INFECTIOUS A:   No indication of infection. P:   Monitor clinically.  ENDOCRINE A:   DM. P:   SSI. Hold preadmission glimepiride.  NEUROLOGIC A:   Acute encephalopathy - due to sedation. P:   Sedation:  Fentanyl gtt / Midazolam PRN. RASS goal: 0 to -1. Daily WUA.  Family updated: Wife, Wayne [name], Brother all updated at bedside.  Family hopeful for good recovery given quick response time, max 1 minute of  true downtime without a pulse, and ROSC after 1 shock by ICD.  Interdisciplinary Family Meeting v Palliative Care Meeting:  Due by: 05/28/17.  CC time: 35 min.   STAFF NOTE: I have personally seen and evaluated the patient and reviewed the available data. I have discussed the patient with the housestaff officer or NP. I agree with the resident's note as documented above.  Briefly: Mr. Batrez is a 76 y/o man with cardiac arrest. Doing well, MS is good, likely able to extubate in AM.  The patient is critically ill with multiple organ systems failure and requires high complexity decision making for assessment and support, frequent evaluation and titration of therapies, application of advanced monitoring technologies and extensive interpretation of multiple databases. Critical Care Time devoted to patient care services described in this note is 35 minutes.  Luz Brazen, MD Pulmonary & Critical Care Medicine May 22, 2017, 10:26 PM

## 2017-05-22 NOTE — ED Notes (Signed)
defribrillator interrigated

## 2017-05-22 NOTE — ED Triage Notes (Signed)
The pt was out shopping with his wife  And they were pushing a couch when the pt said wait a minute  Dropped his head turned white and stopped talking.  When ems arrived  The pt had a shockable rhy  The pts defribrillator fired when the first responder arrived.  Ems cardiovertted x one on the way here   siunus rhy bbb now.  Versed 2.5mg  given by ems  Pt diaphorectic cyanotic   He arrived with on the lucas  Removed shortly after arrival  Io lt tib fib  Iv 20 lt hand by ems

## 2017-05-22 NOTE — ED Notes (Signed)
Pt went to c-t and back approx 1630

## 2017-05-22 NOTE — Consult Note (Signed)
Cardiology Consultation:   Patient ID: Wayne [name]; [id number]; August 19, 1941   Admit date: 05/22/2017 Date of Consult: 05/22/2017  Primary Care Provider: [name], MD Primary Cardiologist: Dana Allan Electrophysiologist:  Lovena Le   Patient Profile:   Wayne [name] is a 76 y.o. male with a hx of ischemic DCM who is being seen today for the evaluation of cardiac arrest at the request of Dr Thomasene Lot.  History of Present Illness:   Wayne [name] 76 y.o. chronic systolic CHF EF as low as 15% with PCI DES to proximal and mid LAD 11/2016.  Also history of persistent  Atrial fibrillation. He was placed on life vest after his LAD intervention and had shock from monomorphic VT while at cardiac rehab.  Subsequently had single lead medtronic AICD placed by Dr Lovena Le 02/12/17. Op note indicates DFT;s less than 20 joules and normal function Was helping his wife move furniture at her Decor store and passed out EMS noted VT/VF and shocked him in field with return of BP and pulse King tube removed and intubated in ER.  ECG showed SR with RBBB LAFB no acute MI. Wife indicates he was just feeling tired last couple Of days no chest pain. I stat troponin is negative. Currently intubated with stable hemodynamics in NSR with no VT. He opens his eyes and clenches His fists. Compliant with meds Remote medtronic interrogation indicated rapid ill formed rhythm with long pause that was not shocked. CT head With small right pareital scalp contusion no intracranial abnormality CXR with ETT tube in good position 3 cm above carina mild interstitial edema And AICD in stable position   Past Medical History:  Diagnosis Date  . Adenocarcinoma of prostate ([place])    s/p seed implants  . Arthritis   . Cellulitis of left leg    a. 07/4165 complicated by septic shock  . Chronic combined systolic and diastolic CHF (congestive heart failure) ([place])   . Colon polyps   . CORONARY ATHEROSCLEROSIS NATIVE CORONARY ARTERY    a. 01/2011 Cath/PCI: LM nl, LAD 40-50p, D1 80-small, LCX 95-small, RI 90, RCA 100, EF 20%;  b. 01/2011 Card MRI - No transmural scar;  c. 01/2011 PCI RCA->5 Promus DES, RI->3.0x16 Promus DES; d. Cath 01/13/13 patent LAD & Ramus, diffuse LCx dz, RCA mult overlapping stents w/ 95% osital stenosis, EF 20%, s/p DES to ostial/prox RCA 01/24/13   . Diabetes mellitus, type 2 ([place])   . GERD (gastroesophageal reflux disease)   . Hematoma of leg    a. left leg hematoma 03/2012 in the setting of asa/effient  . Herpes zoster ophthalmicus   . HYPERLIPIDEMIA    intolerant to Lipitor (myalgias)  . HYPERTENSION   . Ischemic cardiomyopathy    a. 01/2012 Echo EF 45%, mild LVH; b. AY30%, grade 1 diastolic dysfunction, diffuse hypokinesis, inferoposterior akinesis   . Noncompliance   . Obesity   . OSA (obstructive sleep apnea)   . Polymyalgia rheumatica ([facility])     Past Surgical History:  Procedure Laterality Date  . CARDIAC CATHETERIZATION  01/24/2013  . CARDIAC CATHETERIZATION N/A 11/14/2016   Procedure: Right/Left Heart Cath and Coronary Angiography;  Surgeon: Larey Dresser, MD;  Location: [place] [facility];  Service: Cardiovascular;  Laterality: N/A;  . CARDIAC CATHETERIZATION N/A 11/17/2016   Procedure: Coronary Stent Intervention w/Impella;  Surgeon: Jazzmon Prindle M Martinique, MD;  Location: [place] [facility];  Service: Cardiovascular;  Laterality: N/A;  . CARDIAC CATHETERIZATION N/A 11/17/2016   Procedure:  Coronary Atherectomy;  Surgeon: Xavi Tomasik M Martinique, MD;  Location: [facility] [facility];  Service: Cardiovascular;  Laterality: N/A;  . CORONARY ANGIOPLASTY WITH STENT PLACEMENT  01/24/2013   DES to RCA  . CORONARY STENT PLACEMENT  2012   reports 6 stents placed  . I&D EXTREMITY  06/15/2012   Procedure: IRRIGATION AND DEBRIDEMENT EXTREMITY;  Surgeon: Newt Minion, MD;  Location: [facility];  Service: Orthopedics;  Laterality: Left;  I&D Left Posterior Knee  . I&D EXTREMITY  06/30/2012   Procedure: IRRIGATION AND DEBRIDEMENT  EXTREMITY;  Surgeon: Newt Minion, MD;  Location: [facility];  Service: Orthopedics;  Laterality: Left;  Left Leg Irrigation and Debridement and placement of Wound VAC and application of  A-cell  . I&D EXTREMITY  07/20/2012   Procedure: IRRIGATION AND DEBRIDEMENT EXTREMITY;  Surgeon: Newt Minion, MD;  Location: [facility];  Service: Orthopedics;  Laterality: Left;  Irrigation and Debridement Left Leg and Place antibiotic beads   . ICD IMPLANT N/A 02/12/2017   Procedure: ICD Implant;  Surgeon: Evans Lance, MD;  Location: [place] [facility];  Service: Cardiovascular;  Laterality: N/A;  . PERCUTANEOUS CORONARY STENT INTERVENTION (PCI-S) N/A 01/24/2013   Procedure: PERCUTANEOUS CORONARY STENT INTERVENTION (PCI-S);  Surgeon: Wellington Hampshire, MD;  Location: [facility];  Service: Cardiovascular;  Laterality: N/A;  . PROSTATE SURGERY     cancer, seed implant  . ULTRASOUND GUIDANCE FOR VASCULAR ACCESS  11/14/2016   Procedure: Ultrasound Guidance For Vascular Access;  Surgeon: Larey Dresser, MD;  Location: [place] [facility];  Service: Cardiovascular;;     Inpatient Medications: Scheduled Meds: . fentaNYL      . midazolam       Continuous Infusions:  PRN Meds: fentaNYL (SUBLIMAZE) injection, fentaNYL, midazolam, midazolam  Allergies:    Allergies  Allergen Reactions  . Plavix [Clopidogrel]     Nose bleeds, swellings, whelps on legs & back, itching  . Lipitor [Atorvastatin] Other (See Comments)    REACTION: sore legs  . Crestor [Rosuvastatin Calcium] Other (See Comments)    Myalgias, Interfering with Gait  . Entresto [Sacubitril-Valsartan]     Angioedema  . Ancef [Cefazolin] Rash    Describes itching and rash, but said 'it wasn't that bad'    Social History:   Social History   Social History  . Marital status: Married    Spouse name: N/A  . Number of children: 1  . Years of education: N/A   Occupational History  . 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  Smoking status: Former Smoker    Packs/day: 0.30    Years: 20.00    Types: Cigarettes    Quit date: 02/23/1989  . Smokeless tobacco: Never Used  . Alcohol use No  . Drug use: No  . Sexual activity: Not on file   Other Topics Concern  . Not on file   Social History Narrative   Retired Administrator    Family History:   The patient's family history includes Alcohol abuse in his father; Cancer (age of onset: 25) in his mother; Heart attack (age of onset: 47) in his father; Heart disease in his father.  ROS:  Please see the history of present illness.  ROS  All other ROS reviewed and negative.     Physical Exam/Data:   Vitals:   05/22/17 1715 05/22/17 1730 05/22/17 1745 05/22/17 1800  BP: 126/73 131/77 (!) 111/59 118/64  Pulse:  60 61 (!) 58 (!) 58  Resp: 18 16 18 18   Temp: (!) 95.4 F (35.2 C) (!) 95.4 F (35.2 C) (!) 95.2 F (35.1 C) (!) 95.2 F (35.1 C)  SpO2: 99% 100% 99% 99%  Weight:      Height:        Intake/Output Summary (Last 24 hours) at 05/22/17 1820 Last data filed at 05/22/17 1706  Gross per 24 hour  Intake             1500 ml  Output                0 ml  Net             1500 ml   Filed Weights   05/22/17 1557  Weight: 228 lb (103.4 kg)   Body mass index is 32.71 kg/m.  Intubated  Chronically ill white male  HEENT: opens eyes ETT tube  Neck supple with no adenopathy JVP normal no bruits no thyromegaly Lungs clear with no wheezing and good diaphragmatic motion Heart:  S1/S2 SEM murmur AICD under left clavicle  PMI enlarged  Abdomen: benighn, BS positve, no tenderness, no AAA no bruit.  No HSM or HJR Distal pulses intact with no bruits Plus one LE  edema Neuro sedated moves extremities and opens eyes  Skin warm and dry No muscular weakness    EKG:   SR RBBB LAFB no ischemic changes or acute ST elevation   Relevant CV Studies: Cath 11/2016 films reviewed  Procedures   Right/Left Heart Cath and Coronary Angiography  Conclusion   1.  Elevated right and left heart filling pressures and low but not markedly low cardiac output.  2. Long occlusion of the RCA within the stented section starting at the ostium.  PDA/PLV fill by collaterals (though not vigorously).  3. Known occlusion of small AV LCx.  4. 95% and 70-80% serial proximal LAD stenoses (calcified).  5. 40-50% eccentric calcified stenosis ostial left main.    Echo:  03/03/17 Study Conclusions  - Left ventricle: The cavity size was moderately dilated. Wall   thickness was increased in a pattern of mild LVH. Systolic   function was severely reduced. The estimated ejection fraction   was in the range of 20% to 25%. Diffuse hypokinesis. Doppler   parameters are consistent with abnormal left ventricular   relaxation (grade 1 diastolic dysfunction). - Aortic valve: Trileaflet; moderately calcified leaflets. There   was mild stenosis. Mean gradient (S): 11 mm Hg. Valve area (VTI):   1.58 cm^2. - Mitral valve: Mildly calcified annulus. There was trivial   regurgitation. - Left atrium: The atrium was mildly dilated. - Right ventricle: The cavity size was normal. Pacer wire or   catheter noted in right ventricle. Systolic function was normal. - Pulmonary arteries: No complete TR doppler jet so unable to   estimate PA systolic pressure. - Inferior vena cava: The vessel was normal in size. The   respirophasic diameter changes were in the normal range (>= 50%),   consistent with normal central venous pressure.  Impressions:  - Moderately dilated LV with mild LV hypertrophy. EF 20-25%,   diffuse hypokinesis. Normal RV size and systolic function. Mild   aortic stenosis. Laboratory Data:  Chemistry Recent Labs Lab 05/22/17 1553  NA 138  K 4.7  CL 106  CO2 24  GLUCOSE 300*  BUN 34*  CREATININE 1.77*  CALCIUM 8.9  GFRNONAA 36*  GFRAA 42*  ANIONGAP 8  Recent Labs Lab 05/22/17 1553  PROT 6.7  ALBUMIN 3.4*  AST 62*  ALT 57  ALKPHOS 70  BILITOT 0.9    Hematology Recent Labs Lab 05/22/17 1553  WBC 8.1  RBC 4.17*  HGB 12.0*  HCT 37.3*  MCV 89.4  MCH 28.8  MCHC 32.2  RDW 13.4  PLT 158   Cardiac EnzymesNo results for input(s): TROPONINI in the last 168 hours.  Recent Labs Lab 05/22/17 1557  TROPIPOC 0.04    BNPNo results for input(s): BNP, PROBNP in the last 168 hours.  DDimer No results for input(s): DDIMER in the last 168 hours.  Radiology/Studies:  Ct Head Wo Contrast  Result Date: 05/22/2017 CLINICAL DATA:  Fall. EXAM: CT HEAD WITHOUT CONTRAST TECHNIQUE: Contiguous axial images were obtained from the base of the skull through the vertex without intravenous contrast. COMPARISON:  02/11/2017 head CT. FINDINGS: Brain: No evidence of parenchymal hemorrhage or extra-axial fluid collection. No mass lesion, mass effect, or midline shift. No CT evidence of acute infarction. Cerebral volume is age appropriate. No ventriculomegaly. Vascular: No hyperdense vessel or unexpected calcification. Skull: No evidence of calvarial fracture. Sinuses/Orbits: No fluid levels. Mucoperiosteal thickening in the bilateral ethmoidal air cells and sphenoid sinus. Other: Small right parietal scalp contusion. The mastoid air cells are unopacified. IMPRESSION: 1. Small right parietal scalp contusion. 2. No evidence of acute intracranial abnormality. No evidence of calvarial fracture. 3. Mild chronic appearing paranasal sinusitis. Electronically Signed   By: Ilona Sorrel M.D.   On: 05/22/2017 17:00   Dg Chest Portable 1 View  Result Date: 05/22/2017 CLINICAL DATA:  Intubation.  Trauma. EXAM: PORTABLE CHEST 1 VIEW COMPARISON:  02/23/2017. FINDINGS: Endotracheal tube noted with its tip at the carina. Proximal repositioning of approximately 3 cm suggested. AICD noted in stable position. Stable cardiomegaly. Mild pulmonary vascular congestion. Mild interstitial prominence. Mild component of CHF cannot be excluded. Small left pleural effusion. Low lung volumes.  IMPRESSION: 1. Endotracheal tube noted with its tip at the carina pre and for repositioning of approximately 3 cm should be considered. 2. AICD in stable position. Mild changes of congestive heart failure with mild interstitial edema small left pleural effusion. Low lung volumes. Critical Value/emergent results were called by telephone at the time of interpretation on 05/22/2017 at 3:56 pm to nurse Claiborne Billings, who verbally acknowledged these results.a Electronically Signed   By: [place]   On: 05/22/2017 15:58    Assessment and Plan:   1. Cardiac Arrest:  Discussed case with Dr Lovena Le who will see patient Suspect detection threshold rate set too high for activation. He has history of  Spontaneous VT with ischemic DCM substrate and this appears to be an arrhythmic event and not ischemic with no acute ECG changes and negative Troponin Support on vent CCM to decide on cooling protocol  CT head ok and ETT in good position with stable hemodynamics. May need amiodarone drip If device not working correctly   2. CHF: mild on CXR had been doing well prior to event will likely need iv lasix if stays intubated 3. CAD:  No evidence of acute ischemic event ASA Brillinta per tube 4. DM:  Cover with SS insulin  5. Anticoagulation: was on low dose xarelto for PAF hold in setting of cardiac arrest     Signed, Jenkins Rouge, MD  05/22/2017 6:20 PM

## 2017-05-22 NOTE — ED Notes (Signed)
Dr Tami Ribas at the bedside       Interrogation   Papers given to dr Tami Ribas

## 2017-05-22 NOTE — Code Documentation (Signed)
Pt medicated  nsr on monitor

## 2017-05-22 NOTE — Progress Notes (Signed)
Chaplain paged to check in on the spouse of the patient who was placed in consult room.  Spouse was outside of Trauma room crying.  Spouse explains what happened to me and to the nurse.  Chaplain is with spouse when MD comes to give her an update.  Spouse is appreciative of Chaplain presence.    Chaplain provides safe space for spouse to share emotions and to talk.  Chaplain provides ministry of presence and encouragement for spouse.    05/22/17 1615  Clinical Encounter Type  Visited With Patient;Family  Visit Type Initial;Spiritual support;Social support;ED;Trauma  Referral From Nurse  Consult/Referral To Chaplain

## 2017-05-22 NOTE — Consult Note (Signed)
Cardiology Consultation:   Patient ID: Wayne [name]; [id number]; 1941/05/03   Admit date: 05/22/2017 Date of Consult: 05/22/2017  Primary Care Provider: [name], MD Primary Cardiologist: Aundra Dubin Primary Electrophysiologist:  Lovena Le   Patient Profile:   Wayne [name] is a 76 y.o. male with a hx of VT who is being seen today for the evaluation of VF arrest at the request of Dr. Thomasene Lot. The patient has an ICM and chronic systolic heart failure.He collapsed at home today and was down for a couple of minutes. The paramedics got to him within a couple of minutes and he was defibrillated according to his wife and the paramedic was shocked. He was intubated. He has had return of spontaneous movement and decision made not to cool him. He did not have chest pain or sob or recent syncope. He has known VT. I have reviewed the interogation of his device. Therapy appears to have been held for almost 3 minutes as his VF became very fine and was undersensed so that the device thought he was in NS VT. He was successfully defibrillated back to NSR.  History of Present Illness:   Wayne [name] is a patient who has an ICM and chronic systolic heart failure.He collapsed at home today and was down for a couple of minutes. The paramedics got to him within a couple of minutes and he was defibrillated according to his wife and the paramedic was shocked. He was intubated. He has had return of spontaneous movement and decision made not to cool him. He did not have chest pain or sob or recent syncope. He has known VT. I have reviewed the interogation of his device. Therapy appears to have been held for almost 3 minutes as his VF became very fine and was undersensed so that the device thought he was in NS VT. He was successfully defibrillated back to NSR.   Past Medical History:  Diagnosis Date  . Adenocarcinoma of prostate ([place])    s/p seed implants  . Arthritis   . Cellulitis of left leg    a. 0/3500  complicated by septic shock  . Chronic combined systolic and diastolic CHF (congestive heart failure) ([place])   . Colon polyps   . CORONARY ATHEROSCLEROSIS NATIVE CORONARY ARTERY    a. 01/2011 Cath/PCI: LM nl, LAD 40-50p, D1 80-small, LCX 95-small, RI 90, RCA 100, EF 20%;  b. 01/2011 Card MRI - No transmural scar;  c. 01/2011 PCI RCA->5 Promus DES, RI->3.0x16 Promus DES; d. Cath 01/13/13 patent LAD & Ramus, diffuse LCx dz, RCA mult overlapping stents w/ 95% osital stenosis, EF 20%, s/p DES to ostial/prox RCA 01/24/13   . Diabetes mellitus, type 2 ([place])   . GERD (gastroesophageal reflux disease)   . Hematoma of leg    a. left leg hematoma 03/2012 in the setting of asa/effient  . Herpes zoster ophthalmicus   . HYPERLIPIDEMIA    intolerant to Lipitor (myalgias)  . HYPERTENSION   . Ischemic cardiomyopathy    a. 01/2012 Echo EF 45%, mild LVH; b. XF81%, grade 1 diastolic dysfunction, diffuse hypokinesis, inferoposterior akinesis   . Noncompliance   . Obesity   . OSA (obstructive sleep apnea)   . Polymyalgia rheumatica ([facility])     Past Surgical History:  Procedure Laterality Date  . CARDIAC CATHETERIZATION  01/24/2013  . CARDIAC CATHETERIZATION N/A 11/14/2016   Procedure: Right/Left Heart Cath and Coronary Angiography;  Surgeon: Larey Dresser, MD;  Location: [facility];  Service: Cardiovascular;  Laterality: N/A;  . CARDIAC CATHETERIZATION N/A 11/17/2016   Procedure: Coronary Stent Intervention w/Impella;  Surgeon: Peter M Martinique, MD;  Location: [facility];  Service: Cardiovascular;  Laterality: N/A;  . CARDIAC CATHETERIZATION N/A 11/17/2016   Procedure: Coronary Atherectomy;  Surgeon: Peter M Martinique, MD;  Location: [facility];  Service: Cardiovascular;  Laterality: N/A;  . CORONARY ANGIOPLASTY WITH STENT PLACEMENT  01/24/2013   DES to RCA  . CORONARY STENT PLACEMENT  2012   reports 6 stents placed  . I&D EXTREMITY  06/15/2012   Procedure: IRRIGATION AND DEBRIDEMENT EXTREMITY;   Surgeon: Newt Minion, MD;  Location: [facility];  Service: Orthopedics;  Laterality: Left;  I&D Left Posterior Knee  . I&D EXTREMITY  06/30/2012   Procedure: IRRIGATION AND DEBRIDEMENT EXTREMITY;  Surgeon: Newt Minion, MD;  Location: [facility];  Service: Orthopedics;  Laterality: Left;  Left Leg Irrigation and Debridement and placement of Wound VAC and application of  A-cell  . I&D EXTREMITY  07/20/2012   Procedure: IRRIGATION AND DEBRIDEMENT EXTREMITY;  Surgeon: Newt Minion, MD;  Location: [facility];  Service: Orthopedics;  Laterality: Left;  Irrigation and Debridement Left Leg and Place antibiotic beads   . ICD IMPLANT N/A 02/12/2017   Procedure: ICD Implant;  Surgeon: Evans Lance, MD;  Location: [facility];  Service: Cardiovascular;  Laterality: N/A;  . PERCUTANEOUS CORONARY STENT INTERVENTION (PCI-S) N/A 01/24/2013   Procedure: PERCUTANEOUS CORONARY STENT INTERVENTION (PCI-S);  Surgeon: Wellington Hampshire, MD;  Location: [facility];  Service: Cardiovascular;  Laterality: N/A;  . PROSTATE SURGERY     cancer, seed implant  . ULTRASOUND GUIDANCE FOR VASCULAR ACCESS  11/14/2016   Procedure: Ultrasound Guidance For Vascular Access;  Surgeon: Larey Dresser, MD;  Location: [facility];  Service: Cardiovascular;;     Inpatient Medications: Scheduled Meds: . amiodarone  200 mg Per Tube Daily  . fentaNYL      . fentaNYL (SUBLIMAZE) injection  50 mcg Intravenous Once  . heparin  5,000 Units Subcutaneous Q8H  . midazolam      . pantoprazole (PROTONIX) IV  40 mg Intravenous QHS  . ticagrelor  60 mg Per Tube BID   Continuous Infusions: . sodium chloride    . sodium chloride    . fentaNYL infusion INTRAVENOUS     PRN Meds: sodium chloride, fentaNYL, midazolam, midazolam  Allergies:    Allergies  Allergen Reactions  . Plavix [Clopidogrel]     Nose bleeds, swellings, whelps on legs & back, itching  . Lipitor [Atorvastatin] Other (See Comments)    REACTION: sore legs  . Crestor  [Rosuvastatin Calcium] Other (See Comments)    Myalgias, Interfering with Gait  . Entresto [Sacubitril-Valsartan]     Angioedema  . Ancef [Cefazolin] Rash    Describes itching and rash, but said 'it wasn't that bad'    Social History:   Social History   Social History  . Marital status: Married    Spouse name: N/A  . Number of children: 1  . Years of education: N/A   Occupational History  . 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 Smoking status: Former Smoker    Packs/day: 0.30    Years: 20.00    Types: Cigarettes    Quit date: 02/23/1989  . Smokeless tobacco: Never Used  . Alcohol use No  . Drug use: No  . Sexual activity:  Not on file   Other Topics Concern  . Not on file   Social History Narrative   Retired Administrator    Family History:   The patient's family history includes Alcohol abuse in his father; Cancer (age of onset: 50) in his mother; Heart attack (age of onset: 49) in his father; Heart disease in his father.  ROS:  Please see the history of present illness.  ROS  All other ROS reviewed and negative. ROS is provided by the wife    Physical Exam/Data:   Vitals:   05/22/17 1715 05/22/17 1730 05/22/17 1745 05/22/17 1800  BP: 126/73 131/77 (!) 111/59 118/64  Pulse: 60 61 (!) 58 (!) 58  Resp: 18 16 18 18   Temp: (!) 95.4 F (35.2 C) (!) 95.4 F (35.2 C) (!) 95.2 F (35.1 C) (!) 95.2 F (35.1 C)  SpO2: 99% 100% 99% 99%  Weight:      Height:        Intake/Output Summary (Last 24 hours) at 05/22/17 1842 Last data filed at 05/22/17 1706  Gross per 24 hour  Intake             1500 ml  Output                0 ml  Net             1500 ml   Filed Weights   05/22/17 1557  Weight: 228 lb (103.4 kg)   Body mass index is 32.71 kg/m.  General: intubated, well developed, and sedated HEENT: ET tube in place Lymph: no adenopathy Neck: 8 cm JVD Endocrine:  No thryomegaly Vascular: No carotid bruits; FA pulses 2+  bilaterally without bruits  Cardiac:  normal S1, S2; RRR; no murmur ,PMI is enlarged Lungs:  Scattered rhonchi Abd: soft, nontender, no hepatomegaly  Ext: no edema Musculoskeletal:  No deformities, BUE and BLE strength normal and equal Skin: warm and dry  Neuro:  Withdraws to painful stimuli Psych:  Unable to assess   EKG:  The EKG was personally reviewed and demonstrates NSR vs atrial fib with IVCD  Relevant CV Studies: ICD interogation as described above.  Laboratory Data:  Chemistry Recent Labs Lab 05/22/17 1553  NA 138  K 4.7  CL 106  CO2 24  GLUCOSE 300*  BUN 34*  CREATININE 1.77*  CALCIUM 8.9  GFRNONAA 36*  GFRAA 42*  ANIONGAP 8     Recent Labs Lab 05/22/17 1553  PROT 6.7  ALBUMIN 3.4*  AST 62*  ALT 57  ALKPHOS 70  BILITOT 0.9   Hematology Recent Labs Lab 05/22/17 1553  WBC 8.1  RBC 4.17*  HGB 12.0*  HCT 37.3*  MCV 89.4  MCH 28.8  MCHC 32.2  RDW 13.4  PLT 158   Cardiac EnzymesNo results for input(s): TROPONINI in the last 168 hours.  Recent Labs Lab 05/22/17 1557  TROPIPOC 0.04    BNPNo results for input(s): BNP, PROBNP in the last 168 hours.  DDimer No results for input(s): DDIMER in the last 168 hours.  Radiology/Studies:  Ct Head Wo Contrast  Result Date: 05/22/2017 CLINICAL DATA:  Fall. EXAM: CT HEAD WITHOUT CONTRAST TECHNIQUE: Contiguous axial images were obtained from the base of the skull through the vertex without intravenous contrast. COMPARISON:  02/11/2017 head CT. FINDINGS: Brain: No evidence of parenchymal hemorrhage or extra-axial fluid collection. No mass lesion, mass effect, or midline shift. No CT evidence of acute infarction. Cerebral volume is age appropriate. No  ventriculomegaly. Vascular: No hyperdense vessel or unexpected calcification. Skull: No evidence of calvarial fracture. Sinuses/Orbits: No fluid levels. Mucoperiosteal thickening in the bilateral ethmoidal air cells and sphenoid sinus. Other: Small right parietal  scalp contusion. The mastoid air cells are unopacified. IMPRESSION: 1. Small right parietal scalp contusion. 2. No evidence of acute intracranial abnormality. No evidence of calvarial fracture. 3. Mild chronic appearing paranasal sinusitis. Electronically Signed   By: Ilona Sorrel M.D.   On: 05/22/2017 17:00   Dg Chest Portable 1 View  Result Date: 05/22/2017 CLINICAL DATA:  Intubation.  Trauma. EXAM: PORTABLE CHEST 1 VIEW COMPARISON:  02/23/2017. FINDINGS: Endotracheal tube noted with its tip at the carina. Proximal repositioning of approximately 3 cm suggested. AICD noted in stable position. Stable cardiomegaly. Mild pulmonary vascular congestion. Mild interstitial prominence. Mild component of CHF cannot be excluded. Small left pleural effusion. Low lung volumes. IMPRESSION: 1. Endotracheal tube noted with its tip at the carina pre and for repositioning of approximately 3 cm should be considered. 2. AICD in stable position. Mild changes of congestive heart failure with mild interstitial edema small left pleural effusion. Low lung volumes. Critical Value/emergent results were called by telephone at the time of interpretation on 05/22/2017 at 3:56 pm to nurse Claiborne Billings, who verbally acknowledged these results.a Electronically Signed   By: Marcello Moores  Register   On: 05/22/2017 15:58    Assessment and Plan:   1. VF arrest with approx 3 minutes before return of spontaneous circulation - would start IV amiodarone. No bolus. 2. Chronic systolic heart failure - symptoms were class 2 up until today. He will be restarted on home meds once extubated. 3. ICD - will formally interogate his device tomorrow. He will need reprograming at the minimum. I will increase sensitivity and reduce his detection duration 4. CAD - he has a severe ICM. He had not had angina prior to this event. I would not be likely to recommend a heart cath unless he has a large troponin increase.  Gregg Taylor,M.D.   Signed, [name], MD    05/22/2017 6:42 PM

## 2017-05-22 NOTE — Code Documentation (Signed)
Pt  Intubated by the ed res

## 2017-05-23 ENCOUNTER — Inpatient Hospital Stay (HOSPITAL_COMMUNITY): Payer: Medicare Other

## 2017-05-23 DIAGNOSIS — J9600 Acute respiratory failure, unspecified whether with hypoxia or hypercapnia: Secondary | ICD-10-CM

## 2017-05-23 DIAGNOSIS — I351 Nonrheumatic aortic (valve) insufficiency: Secondary | ICD-10-CM

## 2017-05-23 DIAGNOSIS — I5022 Chronic systolic (congestive) heart failure: Secondary | ICD-10-CM

## 2017-05-23 DIAGNOSIS — T82190D Other mechanical complication of cardiac electrode, subsequent encounter: Secondary | ICD-10-CM

## 2017-05-23 DIAGNOSIS — I469 Cardiac arrest, cause unspecified: Secondary | ICD-10-CM

## 2017-05-23 LAB — BLOOD GAS, ARTERIAL
ACID-BASE DEFICIT: 0.2 mmol/L (ref 0.0–2.0)
Bicarbonate: 22.5 mmol/L (ref 20.0–28.0)
DRAWN BY: 441351
FIO2: 40
LHR: 18 {breaths}/min
O2 SAT: 98.3 %
PCO2 ART: 28.7 mmHg — AB (ref 32.0–48.0)
PEEP: 5 cmH2O
PH ART: 7.507 — AB (ref 7.350–7.450)
Patient temperature: 98.6
VT: 590 mL
pO2, Arterial: 98.8 mmHg (ref 83.0–108.0)

## 2017-05-23 LAB — ECHOCARDIOGRAM COMPLETE
AO mean calculated velocity dopler: 141 cm/s
AOVTI: 40.2 cm
AV Area mean vel: 1.98 cm2
AV VEL mean LVOT/AV: 0.48
AV peak Index: 0.84
AV pk vel: 208 cm/s
AVA: 2.06 cm2
AVAREAMEANVIN: 0.89 cm2/m2
AVAREAVTI: 1.86 cm2
AVAREAVTIIND: 0.93 cm2/m2
AVG: 9 mmHg
AVPG: 17 mmHg
Ao pk vel: 0.45 m/s
CHL CUP AV VALUE AREA INDEX: 0.93
CHL CUP AV VEL: 2.06
E/e' ratio: 7.71
EWDT: 275 ms
FS: 15 % — AB (ref 28–44)
Height: 70 in
IVS/LV PW RATIO, ED: 0.92
LA ID, A-P, ES: 52 mm
LA diam index: 2.35 cm/m2
LAVOL: 80.9 mL
LAVOLA4C: 79.9 mL
LAVOLIN: 36.6 mL/m2
LEFT ATRIUM END SYS DIAM: 52 mm
LV TDI E'LATERAL: 8.81
LV dias vol: 237 mL — AB (ref 62–150)
LV sys vol: 172 mL — AB (ref 21–61)
LVDIAVOLIN: 107 mL/m2
LVEEAVG: 7.71
LVEEMED: 7.71
LVELAT: 8.81 cm/s
LVOT VTI: 20 cm
LVOT area: 4.15 cm2
LVOT peak grad rest: 3 mmHg
LVOT peak vel: 93.1 cm/s
LVOTD: 23 mm
LVOTSV: 83 mL
LVOTVTI: 0.5 cm
LVSYSVOLIN: 78 mL/m2
MV Dec: 275
MV pk A vel: 154 m/s
MVPKEVEL: 67.9 m/s
PW: 12 mm — AB (ref 0.6–1.1)
RV LATERAL S' VELOCITY: 22 cm/s
RV TAPSE: 20.3 mm
Simpson's disk: 27
Stroke v: 65 ml
TDI e' medial: 7.18
Weight: 3679.04 oz

## 2017-05-23 LAB — GLUCOSE, CAPILLARY
GLUCOSE-CAPILLARY: 214 mg/dL — AB (ref 65–99)
GLUCOSE-CAPILLARY: 133 mg/dL — AB (ref 65–99)
Glucose-Capillary: 122 mg/dL — ABNORMAL HIGH (ref 65–99)
Glucose-Capillary: 168 mg/dL — ABNORMAL HIGH (ref 65–99)
Glucose-Capillary: 209 mg/dL — ABNORMAL HIGH (ref 65–99)
Glucose-Capillary: 250 mg/dL — ABNORMAL HIGH (ref 65–99)

## 2017-05-23 LAB — BASIC METABOLIC PANEL
ANION GAP: 11 (ref 5–15)
BUN: 29 mg/dL — ABNORMAL HIGH (ref 6–20)
CALCIUM: 9.2 mg/dL (ref 8.9–10.3)
CO2: 25 mmol/L (ref 22–32)
Chloride: 105 mmol/L (ref 101–111)
Creatinine, Ser: 1.57 mg/dL — ABNORMAL HIGH (ref 0.61–1.24)
GFR, EST AFRICAN AMERICAN: 49 mL/min — AB (ref >60)
GFR, EST NON AFRICAN AMERICAN: 42 mL/min — AB (ref >60)
Glucose, Bld: 255 mg/dL — ABNORMAL HIGH (ref 65–99)
POTASSIUM: 4.9 mmol/L (ref 3.5–5.1)
SODIUM: 141 mmol/L (ref 135–145)

## 2017-05-23 LAB — APTT
APTT: 34 s (ref 24–36)
APTT: 171 s — AB (ref 24–36)
APTT: 110 s — AB (ref 24–36)

## 2017-05-23 LAB — HEMOGLOBIN A1C
Hgb A1c MFr Bld: 8.9 % — ABNORMAL HIGH (ref 4.8–5.6)
Mean Plasma Glucose: 209 mg/dL

## 2017-05-23 LAB — PHOSPHORUS: PHOSPHORUS: 3.3 mg/dL (ref 2.5–4.6)

## 2017-05-23 LAB — URINE CULTURE: CULTURE: NO GROWTH

## 2017-05-23 LAB — CBC
HCT: 39.9 % (ref 39.0–52.0)
Hemoglobin: 12.7 g/dL — ABNORMAL LOW (ref 13.0–17.0)
MCH: 28.7 pg (ref 26.0–34.0)
MCHC: 31.8 g/dL (ref 30.0–36.0)
MCV: 90.3 fL (ref 78.0–100.0)
PLATELETS: 170 10*3/uL (ref 150–400)
RBC: 4.42 MIL/uL (ref 4.22–5.81)
RDW: 13.7 % (ref 11.5–15.5)
WBC: 10 10*3/uL (ref 4.0–10.5)

## 2017-05-23 LAB — TROPONIN I: Troponin I: 0.44 ng/mL (ref <0.03)

## 2017-05-23 LAB — HEPARIN LEVEL (UNFRACTIONATED)

## 2017-05-23 LAB — MAGNESIUM: Magnesium: 2.2 mg/dL (ref 1.7–2.4)

## 2017-05-23 MED ORDER — HEPARIN (PORCINE) IN NACL 100-0.45 UNIT/ML-% IJ SOLN
1200.0000 [IU]/h | INTRAMUSCULAR | Status: DC
  Administered 2017-05-23: 1500 [IU]/h via INTRAVENOUS
  Administered 2017-05-24: 1100 [IU]/h via INTRAVENOUS
  Filled 2017-05-23 (×2): qty 250

## 2017-05-23 MED ORDER — DIGOXIN 125 MCG PO TABS
0.0625 mg | ORAL_TABLET | Freq: Every day | ORAL | Status: DC
  Administered 2017-05-23 – 2017-05-27 (×4): 0.0625 mg via ORAL
  Filled 2017-05-23 (×4): qty 1

## 2017-05-23 MED ORDER — PANTOPRAZOLE SODIUM 40 MG PO PACK
40.0000 mg | PACK | Freq: Every day | ORAL | Status: DC
  Administered 2017-05-23: 40 mg
  Filled 2017-05-23 (×2): qty 20

## 2017-05-23 NOTE — Progress Notes (Signed)
  Echocardiogram 2D Echocardiogram has been performed.  Wayne [name] 05/23/2017, 3:42 PM

## 2017-05-23 NOTE — Procedures (Signed)
Extubation Procedure Note  Patient Details:   Name: Wayne [name] DOB: [birth year] MRN: [id number]   Airway Documentation:     Evaluation  O2 sats: stable throughout Complications: No apparent complications Patient did tolerate procedure well. Bilateral Breath Sounds: Clear, Diminished   Yes   Pt extubated to 3L [place] per MD order. Pt stable throughout with no complications. Positive cuff leak noted, pt able to speak and has a strong productive cough post extubation. Pt encouraged to use [place]. RN at bedside. RT will continue to monitor.   Jesse Sans 05/23/2017, 11:38 AM

## 2017-05-23 NOTE — Progress Notes (Signed)
Progress Note  Patient Name: Wayne [name] Date of Encounter: 05/23/2017  Primary Cardiologist: Dr. Aundra Dubin  Subjective   Patient is awake this morning but remains intubated hemodynamically stable overnight  Inpatient Medications    Scheduled Meds: . chlorhexidine gluconate (MEDLINE KIT)  15 mL Mouth Rinse BID  . insulin aspart  0-15 Units Subcutaneous Q4H  . mouth rinse  15 mL Mouth Rinse 10 times per day  . pantoprazole (PROTONIX) IV  40 mg Intravenous QHS  . pravastatin  20 mg Oral q1800  . ticagrelor  60 mg Per Tube BID   Continuous Infusions: . sodium chloride 75 mL/hr at 05/22/17 2100  . sodium chloride    . amiodarone 30 mg/hr (05/23/17 0055)  . fentaNYL infusion INTRAVENOUS Stopped (05/23/17 0800)  . heparin 1,700 Units/hr (05/23/17 0323)   PRN Meds: sodium chloride, fentaNYL, midazolam, midazolam   Vital Signs    Vitals:   05/23/17 0400 05/23/17 0500 05/23/17 0600 05/23/17 0751  BP: (!) 87/44 (!) 87/47 (!) 91/50 (!) 106/52  Pulse:    (!) 57  Resp: _0 (!) 21  Temp: 98.8 F (37.1 C) 99 F (37.2 C) 99 F (37.2 C) 99 F (37.2 C)  TempSrc: Core (Comment)     SpO2: 98% 97% 98% 98%  Weight:  229 lb 15 oz (104.3 kg)    Height:        Intake/Output Summary (Last 24 hours) at 05/23/17 0859 Last data filed at 05/23/17 0800  Gross per 24 hour  Intake          2687.61 ml  Output             1075 ml  Net          1612.61 ml   Filed Weights   05/22/17 1557 05/22/17 2015 05/23/17 0500  Weight: 228 lb (103.4 kg) 232 lb 2.3 oz (105.3 kg) 229 lb 15 oz (104.3 kg)    Telemetry    Normal sinus rhythm - Personally Reviewed  ECG    None today - Personally Reviewed  Physical Exam   GEN: Awake but intubated, appears comfortable Neck: 7 cm JVD Cardiac: RRR, no murmurs, rubs, or gallops.  Respiratory: Clear to auscultation bilaterally. GI: Soft, nontender, non-distended  MS: No edema; No deformity. Neuro:  Nonfocal  Psych: Normal affect   Labs      Chemistry Recent Labs Lab 05/22/17 1553 05/23/17 0109  NA 138 141  K 4.7 4.9  CL 106 105  CO2 24 25  GLUCOSE 300* 255*  BUN 34* 29*  CREATININE 1.77* 1.57*  CALCIUM 8.9 9.2  PROT 6.7  --   ALBUMIN 3.4*  --   AST 62*  --   ALT 57  --   ALKPHOS 70  --   BILITOT 0.9  --   GFRNONAA 36* 42*  GFRAA 42* 49*  ANIONGAP 8 11     Hematology Recent Labs Lab 05/22/17 1553 05/23/17 0109  WBC 8.1 10.0  RBC 4.17* 4.42  HGB 12.0* 12.7*  HCT 37.3* 39.9  MCV 89.4 90.3  MCH 28.8 28.7  MCHC 32.2 31.8  RDW 13.4 13.7  PLT 158 170    Cardiac Enzymes Recent Labs Lab 05/22/17 1928 05/23/17 0108  TROPONINI 0.20* 0.44*    Recent Labs Lab 05/22/17 1557  TROPIPOC 0.04     BNPNo results for input(s): BNP, PROBNP in the last 168 hours.   DDimer No results for input(s): DDIMER in the last 168  hours.   Radiology    Ct Head Wo Contrast  Result Date: 05/22/2017 CLINICAL DATA:  Fall. EXAM: CT HEAD WITHOUT CONTRAST TECHNIQUE: Contiguous axial images were obtained from the base of the skull through the vertex without intravenous contrast. COMPARISON:  02/11/2017 head CT. FINDINGS: Brain: No evidence of parenchymal hemorrhage or extra-axial fluid collection. No mass lesion, mass effect, or midline shift. No CT evidence of acute infarction. Cerebral volume is age appropriate. No ventriculomegaly. Vascular: No hyperdense vessel or unexpected calcification. Skull: No evidence of calvarial fracture. Sinuses/Orbits: No fluid levels. Mucoperiosteal thickening in the bilateral ethmoidal air cells and sphenoid sinus. Other: Small right parietal scalp contusion. The mastoid air cells are unopacified. IMPRESSION: 1. Small right parietal scalp contusion. 2. No evidence of acute intracranial abnormality. No evidence of calvarial fracture. 3. Mild chronic appearing paranasal sinusitis. Electronically Signed   By: Ilona Sorrel M.D.   On: 05/22/2017 17:00   Dg Chest Port 1 View  Result Date:  05/23/2017 CLINICAL DATA:  Hypoxia EXAM: PORTABLE CHEST 1 VIEW COMPARISON:  May 22, 2017 FINDINGS: Endotracheal tube tip is 3.9 cm above the carina. Nasogastric tube tip and side port are below the diaphragm. Pacemaker lead is attached to the right ventricle. No pneumothorax. There is a small left pleural effusion with left base atelectasis. Lungs elsewhere clear. Heart is mildly enlarged. The pulmonary vascular is normal. No adenopathy. There is aortic atherosclerosis. No bone lesions evident. IMPRESSION: Tube positions as described without pneumothorax. Stable cardiomegaly with aortic atherosclerosis. Small left pleural effusion with left base atelectasis. No frank consolidation or edema appreciable. Electronically Signed   By: Lowella Grip III M.D.   On: 05/23/2017 07:27   Dg Chest Portable 1 View  Result Date: 05/22/2017 CLINICAL DATA:  Intubation.  Trauma. EXAM: PORTABLE CHEST 1 VIEW COMPARISON:  02/23/2017. FINDINGS: Endotracheal tube noted with its tip at the carina. Proximal repositioning of approximately 3 cm suggested. AICD noted in stable position. Stable cardiomegaly. Mild pulmonary vascular congestion. Mild interstitial prominence. Mild component of CHF cannot be excluded. Small left pleural effusion. Low lung volumes. IMPRESSION: 1. Endotracheal tube noted with its tip at the carina pre and for repositioning of approximately 3 cm should be considered. 2. AICD in stable position. Mild changes of congestive heart failure with mild interstitial edema small left pleural effusion. Low lung volumes. Critical Value/emergent results were called by telephone at the time of interpretation on 05/22/2017 at 3:56 pm to nurse Claiborne Billings, who verbally acknowledged these results.a Electronically Signed   By: Marcello Moores  Register   On: 05/22/2017 15:58    Cardiac Studies   ICD evaluation - full report to follow  Patient Profile     76 y.o. male admitted with a VF arrest, with prolongation of the ICD to shock due  to under sensing of ventricular fibrillation  Assessment & Plan    1. Ventricular fibrillation cardiac arrest - he is status post resuscitation and appears to be awake and alert. We'll transition from intravenous amiodarone to oral amiodarone today 2. ICD implant - we'll plan to interrogate his Med[employer] ICD and reprogramming the device in hopes of improving his detection of ventricular fibrillation I would anticipate testing of the device prior to discharge. 3. Ischemic cardiomyopathy - he denies anginal symptoms. His cardiac enzymes are minimally increased. No plans for heart catheterization at the present time 4. Chronic systolic heart failure - until his presentation, he had been euvolemic, and was actually helping move a sofa. 5. Hypertensive heart disease -  his blood pressure remains on the soft side. I expect this will improve once he is extubated.   Signed, [name], MD  05/23/2017, 8:59 AM  Patient ID: Kelton Pillar, male   DOB: 1941-04-04, 76 y.o.   MRN: [id number]

## 2017-05-23 NOTE — Progress Notes (Signed)
Patient resting HR is in the 50's and blood pressure is 90's/ 40's. BP has fluctuated because heis asleep.   Verified with the doctor if he wanted the patient to receive Amio; RN was to asked start the Amio at 30 instead.   Once medication was started, the patient did fine.   Will continue to monitor,  [employer]

## 2017-05-23 NOTE — Progress Notes (Addendum)
PULMONARY / CRITICAL CARE MEDICINE   Name: Wayne [name] MRN: [id number] DOB: 1941-02-12    ADMISSION DATE:  05/22/2017 CONSULTATION DATE:  05/22/17  REFERRING MD:  Thomasene Lot  CHIEF COMPLAINT:  Cardiac Arrest  HISTORY OF PRESENT ILLNESS:  Pt is encephelopathic; therefore, this HPI is obtained from chart review. Wayne [name] is a 76 y.o. male with PMH as outlined below including but not limited to ischemic cardiomyopathy (echo from March 2019 with EF 20-25%, G1DD) s/p ICD.  He presented to [facility] ED 6/8 cardiac arrest.  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 Per wife, he was pushing a couch and then stopped and said "wait a minute" before he became pale and collapsed. Wife was able to catch him and break his fall. She states that he was breathing however was unresponsive. EMS happened to be outside of the store; therefore, she asked [place] to associate to run and get them. While waiting for EMS, wife states that he took to pick breaths and then started turning blue in the neck and face. Upon EMS arrival, he was noted to have VT rhythm and as they were about to begin CPR, his ICD fired with successful conversion to NSR.  Downtime of roughly 1 minute  Per wife, he was in his usual state of health prior to this. He had not had any recent illness, had not complained of any symptoms.  In ED, he was intubated for airway protection and PCCM was called for admission.  Just prior to my assessment, patient had received PRN Fentanyl and Versed; therefore, neurological exam was difficult to interpret. However, while I was discussing his case and prognosis with family, patient began to come around. He had his eyes open and was looking around the room.  He was able to follow basic commands (squeezed my fingers with bilateral hands, wiggle toes).  Based on his ability to follow commands, decision was made to defer cooling.  Device currently being interrogated by cardiology. Their  initial thoughts was that patient had developed VT with a slower heart rate then ICD was sent to fire at; therefore, there was a delayed response time in ICD firing.   PAST MEDICAL HISTORY :  He  has a past medical history of Adenocarcinoma of prostate ([place]); Arthritis; Cellulitis of left leg; Chronic combined systolic and diastolic CHF (congestive heart failure) ([place]); Colon polyps; CORONARY ATHEROSCLEROSIS NATIVE CORONARY ARTERY; Diabetes mellitus, type 2 ([place]); GERD (gastroesophageal reflux disease); Hematoma of leg; Herpes zoster ophthalmicus; HYPERLIPIDEMIA; HYPERTENSION; Ischemic cardiomyopathy; Noncompliance; Obesity; OSA (obstructive sleep apnea); and Polymyalgia rheumatica ([place]).  PAST SURGICAL HISTORY: He  has a past surgical history that includes Prostate surgery; Coronary stent placement (2012); I&D extremity (06/15/2012); I&D extremity (06/30/2012); I&D extremity (07/20/2012); Cardiac catheterization (01/24/2013); Coronary angioplasty with stent (01/24/2013); percutaneous coronary stent intervention (pci-s) (N/A, 01/24/2013); Cardiac catheterization (N/A, 11/14/2016); Ultrasound guidance for vascular access (11/14/2016); Cardiac catheterization (N/A, 11/17/2016); Cardiac catheterization (N/A, 11/17/2016); and ICD Implant (N/A, 02/12/2017).  Allergies  Allergen Reactions  . Plavix [Clopidogrel]     Nose bleeds, swellings, whelps on legs & back, itching  . Lipitor [Atorvastatin] Other (See Comments)    REACTION: sore legs  . Crestor [Rosuvastatin Calcium] Other (See Comments)    Myalgias, Interfering with Gait  . Entresto [Sacubitril-Valsartan]     Angioedema  . Ancef [Cefazolin] Rash    Describes itching and rash, but said 'it wasn't that bad'    No current facility-administered medications on  file prior to encounter.    Current Outpatient Prescriptions on File Prior to Encounter  Medication Sig  . acetaminophen (TYLENOL) 500 MG tablet Take 500 mg by mouth daily as needed for moderate pain  or headache.  Marland Kitchen amiodarone (PACERONE) 200 MG tablet 200 mg BID twice daily until 02/18/17, Then take 200 mg daily.  . carvedilol (COREG) 12.5 MG tablet Take 1 tablet (12.5 mg total) by mouth 2 (two) times daily.  . digoxin (LANOXIN) 0.125 MG tablet Take 0.5 tablets (0.0625 mg total) by mouth daily.  . diphenhydramine-acetaminophen (TYLENOL PM) 25-500 MG TABS tablet Take 1 tablet by mouth at bedtime as needed (sleep).  . Evolocumab (REPATHA SURECLICK) 177 MG/ML SOAJ Inject 1 pen into the skin every 14 (fourteen) days.  . furosemide (LASIX) 20 MG tablet Take 4 tablets (80 mg total) by mouth daily.  Marland Kitchen glimepiride (AMARYL) 2 MG tablet TAKE 1 TABLET(2 MG) BY MOUTH DAILY BEFORE BREAKFAST  . Lancets (ACCU-CHEK SOFT TOUCH) lancets Check blood sugars 1-2 times per day. DX: E11.65  . losartan (COZAAR) 25 MG tablet Take 1 tablet (25 mg total) by mouth 2 (two) times daily.  . Multiple Vitamins-Minerals (CENTRUM SILVER ADULT 50+) TABS Take 1 tablet by mouth daily.  . Omega-3 Fatty Acids (FISH OIL) 1000 MG CAPS Take 2,000 mg by mouth daily.  . pravastatin (PRAVACHOL) 20 MG tablet Take 1 tablet (20 mg total) by mouth every evening.  . Rivaroxaban (XARELTO) 15 MG TABS tablet Take 1 tablet (15 mg total) by mouth daily.  Marland Kitchen spironolactone (ALDACTONE) 25 MG tablet Take 0.5 tablets (12.5 mg total) by mouth daily.  . ticagrelor (BRILINTA) 60 MG TABS tablet Take 1 tablet (60 mg total) by mouth 2 (two) times daily.  . traMADol (ULTRAM) 50 MG tablet Take 1 tablet (50 mg total) by mouth every 6 (six) hours as needed for moderate pain (at device insertion site).    FAMILY HISTORY:  His indicated that his mother is deceased. He indicated that his father is deceased.    SOCIAL HISTORY: He  reports that he quit smoking about 28 years ago. His smoking use included Cigarettes. He has a 6.00 pack-year smoking history. He has never used smokeless tobacco. He reports that he does not drink alcohol or use drugs.  REVIEW OF  SYSTEMS:  Unable to obtain as patient is on vent  SUBJECTIVE:   Awake, on weaning trial.  No more episodes of arrythmia  VITAL SIGNS: BP (!) 103/45   Pulse (!) 57   Temp 98.4 F (36.9 C)   Resp 14   Ht 5\' 10"  (1.778 m)   Wt 229 lb 15 oz (104.3 kg)   SpO2 96%   BMI 32.99 kg/m   HEMODYNAMICS:    VENTILATOR SETTINGS: Vent Mode: PRVC FiO2 (%):  [30 %-100 %] 30 % Set Rate:  [18 bmp] 18 bmp Vt Set:  [590 mL] 590 mL PEEP:  [5 cmH20] 5 cmH20 Plateau Pressure:  [12 cmH20-18 cmH20] 18 cmH20  INTAKE / OUTPUT: I/O last 3 completed shifts: In: 2687.6 [I.V.:2687.6] Out: 975 [Urine:975]   PHYSICAL EXAMINATION: Blood pressure (!) 103/45, pulse (!) 57, temperature 98.4 F (36.9 C), resp. rate 14, height 5\' 10"  (1.778 m), weight 229 lb 15 oz (104.3 kg), SpO2 96 %. Gen:      No acute distress HEENT:  EOMI, sclera anicteric, ETT in place Neck:     No masses; no thyromegaly Lungs:    Clear to auscultation bilaterally; normal respiratory effort CV:  Regular rate and rhythm; no murmurs Abd:      + bowel sounds; soft, non-tender; no palpable masses, no distension Ext:    No edema; adequate peripheral perfusion Skin:      Warm and dry; no rash Neuro: alert and oriented x 3 Psych: normal mood and affect  LABS:  BMET  Recent Labs Lab 05/22/17 1553 05/23/17 0109  NA 138 141  K 4.7 4.9  CL 106 105  CO2 24 25  BUN 34* 29*  CREATININE 1.77* 1.57*  GLUCOSE 300* 255*    Electrolytes  Recent Labs Lab 05/22/17 1553 05/23/17 0109  CALCIUM 8.9 9.2  MG  --  2.2  PHOS  --  3.3    CBC  Recent Labs Lab 05/22/17 1553 05/23/17 0109  WBC 8.1 10.0  HGB 12.0* 12.7*  HCT 37.3* 39.9  PLT 158 170    Coag's  Recent Labs Lab 05/22/17 1553 05/22/17 1928 05/23/17 0109  APTT  --  35 34  INR 2.06  --   --     Sepsis Markers  Recent Labs Lab 05/22/17 1559  LATICACIDVEN 3.17*    ABG  Recent Labs Lab 05/22/17 1655 05/23/17 0342  PHART 7.395 7.507*   PCO2ART 38.0 28.7*  PO2ART 201.0* 98.8    Liver Enzymes  Recent Labs Lab 05/22/17 1553  AST 62*  ALT 57  ALKPHOS 70  BILITOT 0.9  ALBUMIN 3.4*    Cardiac Enzymes  Recent Labs Lab 05/22/17 1928 05/23/17 0108  TROPONINI 0.20* 0.44*    Glucose  Recent Labs Lab 05/22/17 2033 05/23/17 0005 05/23/17 0326 05/23/17 0831  GLUCAP 262* 250* 214* 168*    Imaging Ct Head Wo Contrast  Result Date: 05/22/2017 CLINICAL DATA:  Fall. EXAM: CT HEAD WITHOUT CONTRAST TECHNIQUE: Contiguous axial images were obtained from the base of the skull through the vertex without intravenous contrast. COMPARISON:  02/11/2017 head CT. FINDINGS: Brain: No evidence of parenchymal hemorrhage or extra-axial fluid collection. No mass lesion, mass effect, or midline shift. No CT evidence of acute infarction. Cerebral volume is age appropriate. No ventriculomegaly. Vascular: No hyperdense vessel or unexpected calcification. Skull: No evidence of calvarial fracture. Sinuses/Orbits: No fluid levels. Mucoperiosteal thickening in the bilateral ethmoidal air cells and sphenoid sinus. Other: Small right parietal scalp contusion. The mastoid air cells are unopacified. IMPRESSION: 1. Small right parietal scalp contusion. 2. No evidence of acute intracranial abnormality. No evidence of calvarial fracture. 3. Mild chronic appearing paranasal sinusitis. Electronically Signed   By: Ilona Sorrel M.D.   On: 05/22/2017 17:00   Dg Chest Port 1 View  Result Date: 05/23/2017 CLINICAL DATA:  Hypoxia EXAM: PORTABLE CHEST 1 VIEW COMPARISON:  May 22, 2017 FINDINGS: Endotracheal tube tip is 3.9 cm above the carina. Nasogastric tube tip and side port are below the diaphragm. Pacemaker lead is attached to the right ventricle. No pneumothorax. There is a small left pleural effusion with left base atelectasis. Lungs elsewhere clear. Heart is mildly enlarged. The pulmonary vascular is normal. No adenopathy. There is aortic atherosclerosis.  No bone lesions evident. IMPRESSION: Tube positions as described without pneumothorax. Stable cardiomegaly with aortic atherosclerosis. Small left pleural effusion with left base atelectasis. No frank consolidation or edema appreciable. Electronically Signed   By: Lowella Grip III M.D.   On: 05/23/2017 07:27   Dg Chest Portable 1 View  Result Date: 05/22/2017 CLINICAL DATA:  Intubation.  Trauma. EXAM: PORTABLE CHEST 1 VIEW COMPARISON:  02/23/2017. FINDINGS: Endotracheal tube noted with its tip  at the carina. Proximal repositioning of approximately 3 cm suggested. AICD noted in stable position. Stable cardiomegaly. Mild pulmonary vascular congestion. Mild interstitial prominence. Mild component of CHF cannot be excluded. Small left pleural effusion. Low lung volumes. IMPRESSION: 1. Endotracheal tube noted with its tip at the carina pre and for repositioning of approximately 3 cm should be considered. 2. AICD in stable position. Mild changes of congestive heart failure with mild interstitial edema small left pleural effusion. Low lung volumes. Critical Value/emergent results were called by telephone at the time of interpretation on 05/22/2017 at 3:56 pm to nurse Claiborne Billings, who verbally acknowledged these results.a Electronically Signed   By: [place]   On: 05/22/2017 15:58    STUDIES:  CT head 6/8 > no acute process. CXR 6/8 > mild interstitial edema. Echo 6/9 >   CULTURES: Blood 6/8 >  Sputum 6/8 >  Urine 6/8 >   ANTIBIOTICS: None.  SIGNIFICANT EVENTS: 6/8 > admitted after cardiac arrest.  LINES/TUBES: ETT 6/8 >   DISCUSSION: 76 y.o. male admitted 6/8 after VT cardiac arrest. Had delayed response of ICD firing (felt to be due to VT at slower rate than ICD was set to fire at). Required intubation in ED due to inability to protect airway. Cooling deferred given his ability to follow basic commands.  ASSESSMENT / PLAN:  PULMONARY A: Respiratory insufficiency - due to the inability  to protect airway in setting of cardiac arrest. Mild interstitial edema. Hx OSA. P:   Plan for extubation today Offer CPAP at night. Pt is reportedly non compliant  CARDIOVASCULAR A:  VT arrest - with downtime a roughly 1 minute before ICD fired. Hx ICM (echo from March 2019 with EF 20-25%, G1DD), HTN, HLD, PAF, CAD. P:  Follow troponins, echo Cardiology following. ICD interrogation Heparin drip Continue amiodarone. Restart home meds after extubation per cardiology  RENAL A:   AKI. P:   [place]  GASTROINTESTINAL A:   GI prophylaxis. Nutrition. P:   Keep NPO  HEMATOLOGIC / ONCOLOGIC A:   VTE Prophylaxis. Hx adenocarcinoma of prostate - s/p seed implants. P:  SCDs, Heparin gtt  INFECTIOUS A:   No indication of infection. P:   Monitor off antibiotics  ENDOCRINE A:   DM. P:   SSI. Hold preadmission glimepride  NEUROLOGIC A:   Acute encephalopathy - due to sedation. P:   Off sedation in anticipation of extubation  Family updated: Wife, Yolanda Bonine, Brother all updated at bedside 6/9. No family at bedside 6/10.    The patient is critically ill with multiple organ system failure and requires high complexity decision making for assessment and support, frequent evaluation and titration of therapies, advanced monitoring, review of radiographic studies and interpretation of complex data.   Critical Care Time devoted to patient care services, exclusive of separately billable procedures, described in this note is 35 minutes.   Marshell Garfinkel MD  Pulmonary and Critical Care Pager [phone] If no answer or after 3pm call: [phone] 05/23/2017, 11:39 AM

## 2017-05-23 NOTE — Progress Notes (Signed)
ANTICOAGULATION CONSULT NOTE - Follow Up Consult  Pharmacy Consult for Heparin (Xarelto on hold) Indication: atrial fibrillation  Allergies  Allergen Reactions  . Plavix [Clopidogrel]     Nose bleeds, swellings, whelps on legs & back, itching  . Lipitor [Atorvastatin] Other (See Comments)    REACTION: sore legs  . Crestor [Rosuvastatin Calcium] Other (See Comments)    Myalgias, Interfering with Gait  . Entresto [Sacubitril-Valsartan]     Angioedema  . Ancef [Cefazolin] Rash    Describes itching and rash, but said 'it wasn't that bad'    Patient Measurements: Height: 5\' 10"  (177.8 cm) Weight: 229 lb 15 oz (104.3 kg) IBW/kg (Calculated) : 73  Vital Signs: Temp: 98.8 F (37.1 C) (06/09 1200) Temp Source: Core (Comment) (06/09 0800) BP: 128/72 (06/09 1300) Pulse Rate: 60 (06/09 1114)  Labs:  Recent Labs  05/22/17 1553 05/22/17 1835 05/22/17 1928 05/23/17 0108 05/23/17 0109 05/23/17 1126  HGB 12.0*  --   --   --  12.7*  --   HCT 37.3*  --   --   --  39.9  --   PLT 158  --   --   --  170  --   APTT  --   --  35  --  34 171*  LABPROT 23.5*  --   --   --   --   --   INR 2.06  --   --   --   --   --   HEPARINUNFRC  --  >2.10*  --  >2.20*  --   --   CREATININE 1.77*  --   --   --  1.57*  --   TROPONINI  --   --  0.20* 0.44*  --   --     Estimated Creatinine Clearance: 50.7 mL/min (A) (by C-G formula based on SCr of 1.57 mg/dL (H)).  Assessment: Heparin while Xarelto on hold s/p cardiac arrest.   Aptt resulted high this afternoon on 1700 units/hr. No bleeding issues noted. Discussed with nurse will hold x 45 minutes then restart at 1500 units/hr.   Noted plans to drop brilinta and restart xarelto closer to d/c when all possible procedures are completed.  Goal of Therapy:  Heparin level 0.3-0.7 units/ml aPTT 66-102 seconds Monitor platelets by anticoagulation protocol: Yes   Plan:  -Decrease heparin to 1500 units/hr -2300 aptt  Erin Hearing PharmD.,  BCPS Clinical Pharmacist Pager [phone] 05/23/2017 2:23 PM

## 2017-05-23 NOTE — Progress Notes (Signed)
Patient ID: Wayne [name], male   DOB: Sep 05, 1941, 76 y.o.   MRN: [id number]    SUBJECTIVE: Patient well known to me.  Admitted yesterday with VF arrest. Delay to ICD discharge due to undersensing.  Today, awake on vent.  BP stable.  HR in 50s NSR.  He is on amiodarone gtt, no further arrhythmias.  He had been doing well at home until this event.   Scheduled Meds: . chlorhexidine gluconate (MEDLINE KIT)  15 mL Mouth Rinse BID  . insulin aspart  0-15 Units Subcutaneous Q4H  . mouth rinse  15 mL Mouth Rinse 10 times per day  . pantoprazole sodium  40 mg Per Tube Daily  . pravastatin  20 mg Oral q1800  . ticagrelor  60 mg Per Tube BID   Continuous Infusions: . sodium chloride 75 mL/hr at 05/22/17 2100  . sodium chloride    . amiodarone 30 mg/hr (05/23/17 0055)  . fentaNYL infusion INTRAVENOUS Stopped (05/23/17 0800)  . heparin 1,700 Units/hr (05/23/17 0323)   PRN Meds:.sodium chloride, fentaNYL, midazolam, midazolam    Vitals:   05/23/17 0400 05/23/17 0500 05/23/17 0600 05/23/17 0751  BP: (!) 87/44 (!) 87/47 (!) 91/50 (!) 106/52  Pulse:    (!) 57  Resp: _0 (!) 21  Temp: 98.8 F (37.1 C) 99 F (37.2 C) 99 F (37.2 C) 99 F (37.2 C)  TempSrc: Core (Comment)     SpO2: 98% 97% 98% 98%  Weight:  229 lb 15 oz (104.3 kg)    Height:        Intake/Output Summary (Last 24 hours) at 05/23/17 0924 Last data filed at 05/23/17 0800  Gross per 24 hour  Intake          2687.61 ml  Output             1075 ml  Net          1612.61 ml    LABS: Basic Metabolic Panel:  Recent Labs  05/22/17 1553 05/23/17 0109  NA 138 141  K 4.7 4.9  CL 106 105  CO2 24 25  GLUCOSE 300* 255*  BUN 34* 29*  CREATININE 1.77* 1.57*  CALCIUM 8.9 9.2  MG  --  2.2  PHOS  --  3.3   Liver Function Tests:  Recent Labs  05/22/17 1553  AST 62*  ALT 57  ALKPHOS 70  BILITOT 0.9  PROT 6.7  ALBUMIN 3.4*   No results for input(s): LIPASE, AMYLASE in the last 72 hours. CBC:  Recent Labs  05/22/17 1553 05/23/17 0109  WBC 8.1 10.0  NEUTROABS 6.2  --   HGB 12.0* 12.7*  HCT 37.3* 39.9  MCV 89.4 90.3  PLT 158 170   Cardiac Enzymes:  Recent Labs  05/22/17 1928 05/23/17 0108  TROPONINI 0.20* 0.44*   BNP: Invalid input(s): POCBNP D-Dimer: No results for input(s): DDIMER in the last 72 hours. Hemoglobin A1C:  Recent Labs  05/22/17 1928  HGBA1C 8.9*   Fasting Lipid Panel: No results for input(s): CHOL, HDL, LDLCALC, TRIG, CHOLHDL, LDLDIRECT in the last 72 hours. Thyroid Function Tests: No results for input(s): TSH, T4TOTAL, T3FREE, THYROIDAB in the last 72 hours.  Invalid input(s): FREET3 Anemia Panel: No results for input(s): VITAMINB12, FOLATE, FERRITIN, TIBC, IRON, RETICCTPCT in the last 72 hours.  RADIOLOGY: Ct Head Wo Contrast  Result Date: 05/22/2017 CLINICAL DATA:  Fall. EXAM: CT HEAD WITHOUT CONTRAST TECHNIQUE: Contiguous axial images were obtained from the base of the  skull through the vertex without intravenous contrast. COMPARISON:  02/11/2017 head CT. FINDINGS: Brain: No evidence of parenchymal hemorrhage or extra-axial fluid collection. No mass lesion, mass effect, or midline shift. No CT evidence of acute infarction. Cerebral volume is age appropriate. No ventriculomegaly. Vascular: No hyperdense vessel or unexpected calcification. Skull: No evidence of calvarial fracture. Sinuses/Orbits: No fluid levels. Mucoperiosteal thickening in the bilateral ethmoidal air cells and sphenoid sinus. Other: Small right parietal scalp contusion. The mastoid air cells are unopacified. IMPRESSION: 1. Small right parietal scalp contusion. 2. No evidence of acute intracranial abnormality. No evidence of calvarial fracture. 3. Mild chronic appearing paranasal sinusitis. Electronically Signed   By: Ilona Sorrel M.D.   On: 05/22/2017 17:00   Dg Chest Port 1 View  Result Date: 05/23/2017 CLINICAL DATA:  Hypoxia EXAM: PORTABLE CHEST 1 VIEW COMPARISON:  May 22, 2017 FINDINGS:  Endotracheal tube tip is 3.9 cm above the carina. Nasogastric tube tip and side port are below the diaphragm. Pacemaker lead is attached to the right ventricle. No pneumothorax. There is a small left pleural effusion with left base atelectasis. Lungs elsewhere clear. Heart is mildly enlarged. The pulmonary vascular is normal. No adenopathy. There is aortic atherosclerosis. No bone lesions evident. IMPRESSION: Tube positions as described without pneumothorax. Stable cardiomegaly with aortic atherosclerosis. Small left pleural effusion with left base atelectasis. No frank consolidation or edema appreciable. Electronically Signed   By: Lowella Grip III M.D.   On: 05/23/2017 07:27   Dg Chest Portable 1 View  Result Date: 05/22/2017 CLINICAL DATA:  Intubation.  Trauma. EXAM: PORTABLE CHEST 1 VIEW COMPARISON:  02/23/2017. FINDINGS: Endotracheal tube noted with its tip at the carina. Proximal repositioning of approximately 3 cm suggested. AICD noted in stable position. Stable cardiomegaly. Mild pulmonary vascular congestion. Mild interstitial prominence. Mild component of CHF cannot be excluded. Small left pleural effusion. Low lung volumes. IMPRESSION: 1. Endotracheal tube noted with its tip at the carina pre and for repositioning of approximately 3 cm should be considered. 2. AICD in stable position. Mild changes of congestive heart failure with mild interstitial edema small left pleural effusion. Low lung volumes. Critical Value/emergent results were called by telephone at the time of interpretation on 05/22/2017 at 3:56 pm to nurse Claiborne Billings, who verbally acknowledged these results.a Electronically Signed   By: [place]   On: 05/22/2017 15:58    PHYSICAL EXAM General: NAD, intubated.  Neck: No JVD, no thyromegaly or thyroid nodule.  Lungs: Clear to auscultation bilaterally with normal respiratory effort. CV: Lateral PMI.  Heart regular S1/S2, no S3/S4, no murmur.  No peripheral edema.   Abdomen: Soft,  nontender, no hepatosplenomegaly, no distention.  Neurologic: Awake, alert, following commands on vent.   Extremities: No clubbing or cyanosis.   TELEMETRY: Personally reviewed telemetry pt in NSR 50  ASSESSMENT AND PLAN: 1. Ventricular fibrillation arrest: This is his 2nd episode.  Has Medtronic ICD, there was prolongation of time to discharge due to undersensing.  Dr. Lovena Le following and will reprogram device.  I think this is likely scar-mediated.  He does not appear to have worsening of his baseline CHF (had been feeling good prior to event) and doubt ACS (minimal troponin rise).  - Amiodarone IV started, transition to po.  He has been on amiodarone at home => will discuss any further steps with Dr. Lovena Le.  2. Acute respiratory failure: Intubated for airways control with altered mental status.  Now awake/alert on vent.  Suspect he can be extubated today.  3. Chronic systolic CHF: Ischemic cardiomyopathy.  3/18 echo with EF 20-25%, diffuse hypokinesis.  CPX in 10/17 with severe HF limitation.  RHC in 12/17 with elevated filling pressures and low cardiac output.  He is now s/p PCI to proximal to mid LAD with much improved symptoms at home prior to this event.  Medtronic ICD in 3/18 after vfib arrest.  BP lower than baseline today, still on sedation with fentanyl.  - Restart home meds gradually after extubation and discontinuation of sedation => Lasix 80 mg daily, spironolactone 25 mg daily, losartan 25 mg bid, Coreg (may need lower dose with amiodarone use and low HR). Can restart his digoxin today.   - He may be an LVAD candidate in the future.   4. CAD:  Now s/p PCI to proximal LAD.  He had an occluded RCA but there were left to right collaterals.  Much improved symptomatically.  - Continue pravastatin.  Should continue Repatha at home.    - Anticoagulation for atrial fibrillation with antiplatelet agents is complicated with Mr Dupuy. He is allergic to Plavix, hadspontaneous gastrocnemius  hemorrhage with prasugrel.  He has been on ticagrelor 60 bid + Xarelto 15 based on [facility] data.  He has now completed 6 months of ticagrelor post-PCI. Think we can continue ticagrelor + heparin gtt for now, when he goes home think we can stop ticagrelor and have him on Xarelto 20 mg daily (will not make transition to Xarelto today in case EP study is needed).  5. Atrial fibrillation:  He is in NSR today.  - As above, will be on combination of ticagrelor 60 mg bid and Xarelto 15 daily.  When he goes home, think we can drop ticagrelor and just use Xarelto 20 mg daily.  6. OSA: Not using CPAP.   Loralie Champagne 05/23/2017 9:40 AM

## 2017-05-23 NOTE — Progress Notes (Signed)
ANTICOAGULATION CONSULT NOTE - Follow Up Consult  Pharmacy Consult for Heparin (Xarelto on hold) Indication: atrial fibrillation  Allergies  Allergen Reactions  . Plavix [Clopidogrel]     Nose bleeds, swellings, whelps on legs & back, itching  . Lipitor [Atorvastatin] Other (See Comments)    REACTION: sore legs  . Crestor [Rosuvastatin Calcium] Other (See Comments)    Myalgias, Interfering with Gait  . Entresto [Sacubitril-Valsartan]     Angioedema  . Ancef [Cefazolin] Rash    Describes itching and rash, but said 'it wasn't that bad'    Patient Measurements: Height: 5\' 10"  (177.8 cm) Weight: 232 lb 2.3 oz (105.3 kg) IBW/kg (Calculated) : 73  Vital Signs: Temp: 98.2 F (36.8 C) (06/09 0200) Temp Source: Core (Comment) (06/09 0000) BP: 89/46 (06/09 0200) Pulse Rate: 56 (06/08 2325)  Labs:  Recent Labs  05/22/17 1553 05/22/17 1835 05/22/17 1928 05/23/17 0108 05/23/17 0109  HGB 12.0*  --   --   --  12.7*  HCT 37.3*  --   --   --  39.9  PLT 158  --   --   --  170  APTT  --   --  35  --  34  LABPROT 23.5*  --   --   --   --   INR 2.06  --   --   --   --   HEPARINUNFRC  --  >2.10*  --  >2.20*  --   CREATININE 1.77*  --   --   --  1.57*  TROPONINI  --   --  0.20* 0.44*  --     Estimated Creatinine Clearance: 50.9 mL/min (A) (by C-G formula based on SCr of 1.57 mg/dL (H)).  Assessment: Heparin while Xarelto on hold s/p cardiac arrest, aPTT is low, currently using aPTT to dose heparin given Xarelto influence on anti-Xa levels, no issues per RN.   Goal of Therapy:  Heparin level 0.3-0.7 units/ml aPTT 66-102 seconds Monitor platelets by anticoagulation protocol: Yes   Plan:  -Inc heparin to 1700 units/hr -1200 HL  Narda Bonds 05/23/2017,3:03 AM

## 2017-05-23 NOTE — Progress Notes (Signed)
CRITICAL VALUE ALERT  Critical Value:  Troponin  Date & Time Notied:  05/22/17; 0830  Provider Notified: E-Link MD   Orders Received/Actions taken:Heparin gtt started

## 2017-05-24 ENCOUNTER — Inpatient Hospital Stay (HOSPITAL_COMMUNITY): Payer: Medicare Other

## 2017-05-24 DIAGNOSIS — T82110S Breakdown (mechanical) of cardiac electrode, sequela: Secondary | ICD-10-CM

## 2017-05-24 LAB — CBC
HCT: 35.2 % — ABNORMAL LOW (ref 39.0–52.0)
Hemoglobin: 11.1 g/dL — ABNORMAL LOW (ref 13.0–17.0)
MCH: 28.8 pg (ref 26.0–34.0)
MCHC: 31.5 g/dL (ref 30.0–36.0)
MCV: 91.4 fL (ref 78.0–100.0)
Platelets: 162 10*3/uL (ref 150–400)
RBC: 3.85 MIL/uL — ABNORMAL LOW (ref 4.22–5.81)
RDW: 13.9 % (ref 11.5–15.5)
WBC: 8.7 10*3/uL (ref 4.0–10.5)

## 2017-05-24 LAB — BASIC METABOLIC PANEL
ANION GAP: 8 (ref 5–15)
BUN: 22 mg/dL — ABNORMAL HIGH (ref 6–20)
CO2: 23 mmol/L (ref 22–32)
Calcium: 8.4 mg/dL — ABNORMAL LOW (ref 8.9–10.3)
Chloride: 107 mmol/L (ref 101–111)
Creatinine, Ser: 1.5 mg/dL — ABNORMAL HIGH (ref 0.61–1.24)
GFR calc Af Amer: 52 mL/min — ABNORMAL LOW (ref >60)
GFR, EST NON AFRICAN AMERICAN: 44 mL/min — AB (ref >60)
GLUCOSE: 133 mg/dL — AB (ref 65–99)
POTASSIUM: 3.8 mmol/L (ref 3.5–5.1)
Sodium: 138 mmol/L (ref 135–145)

## 2017-05-24 LAB — GLUCOSE, CAPILLARY
GLUCOSE-CAPILLARY: 258 mg/dL — AB (ref 65–99)
GLUCOSE-CAPILLARY: 132 mg/dL — AB (ref 65–99)
Glucose-Capillary: 112 mg/dL — ABNORMAL HIGH (ref 65–99)
Glucose-Capillary: 132 mg/dL — ABNORMAL HIGH (ref 65–99)
Glucose-Capillary: 141 mg/dL — ABNORMAL HIGH (ref 65–99)
Glucose-Capillary: 142 mg/dL — ABNORMAL HIGH (ref 65–99)

## 2017-05-24 LAB — HEPARIN LEVEL (UNFRACTIONATED): HEPARIN UNFRACTIONATED: 0.73 [IU]/mL — AB (ref 0.30–0.70)

## 2017-05-24 LAB — PHOSPHORUS: Phosphorus: 3.1 mg/dL (ref 2.5–4.6)

## 2017-05-24 LAB — APTT
APTT: 115 s — AB (ref 24–36)
APTT: 58 s — AB (ref 24–36)

## 2017-05-24 LAB — MAGNESIUM: Magnesium: 2.1 mg/dL (ref 1.7–2.4)

## 2017-05-24 MED ORDER — TICAGRELOR 60 MG PO TABS
60.0000 mg | ORAL_TABLET | Freq: Two times a day (BID) | ORAL | Status: DC
  Administered 2017-05-24 – 2017-05-25 (×3): 60 mg via ORAL
  Filled 2017-05-24 (×4): qty 1

## 2017-05-24 MED ORDER — FENTANYL CITRATE (PF) 100 MCG/2ML IJ SOLN
25.0000 ug | INTRAMUSCULAR | Status: DC | PRN
  Administered 2017-05-24 – 2017-05-25 (×5): 25 ug via INTRAVENOUS
  Filled 2017-05-24 (×5): qty 2

## 2017-05-24 MED ORDER — GLIMEPIRIDE 4 MG PO TABS
2.0000 mg | ORAL_TABLET | Freq: Every day | ORAL | Status: DC
  Administered 2017-05-25 – 2017-05-27 (×2): 2 mg via ORAL
  Filled 2017-05-24 (×2): qty 1

## 2017-05-24 MED ORDER — PANTOPRAZOLE SODIUM 40 MG PO TBEC
40.0000 mg | DELAYED_RELEASE_TABLET | Freq: Every day | ORAL | Status: DC
  Administered 2017-05-24 – 2017-05-27 (×3): 40 mg via ORAL
  Filled 2017-05-24 (×3): qty 1

## 2017-05-24 NOTE — Progress Notes (Signed)
Progress Note  Patient Name: Wayne [name] Date of Encounter: 05/24/2017  Primary Cardiologist: Aundra Dubin  Subjective   Awake, status post extubation, with minimal residual chest wall pain  Inpatient Medications    Scheduled Meds: . digoxin  0.0625 mg Oral Daily  . insulin aspart  0-15 Units Subcutaneous Q4H  . pantoprazole  40 mg Oral Daily  . pravastatin  20 mg Oral q1800  . ticagrelor  60 mg Per Tube BID   Continuous Infusions: . sodium chloride    . fentaNYL infusion INTRAVENOUS Stopped (05/23/17 0800)  . heparin 1,100 Units/hr (05/24/17 0934)   PRN Meds: sodium chloride, fentaNYL, fentaNYL (SUBLIMAZE) injection, midazolam, midazolam   Vital Signs    Vitals:   05/24/17 0700 05/24/17 0730 05/24/17 0800 05/24/17 0900  BP: (!) 115/55  (!) 116/53 (!) 112/47  Pulse:      Resp: 16 18 18 16   Temp:      TempSrc:      SpO2: 95% 96% (!) 89% 95%  Weight:      Height:        Intake/Output Summary (Last 24 hours) at 05/24/17 0945 Last data filed at 05/24/17 0900  Gross per 24 hour  Intake          1503.02 ml  Output             1450 ml  Net            53.02 ml   Filed Weights   05/22/17 2015 05/23/17 0500 05/24/17 0432  Weight: 232 lb 2.3 oz (105.3 kg) 229 lb 15 oz (104.3 kg) 229 lb 4.5 oz (104 kg)    Telemetry    Normal sinus rhythm, rare PVCs - Personally Reviewed  ECG    Normal sinus rhythm - Personally Reviewed  Physical Exam   GEN: No acute distress.   Neck: 7 cm JVD Cardiac: RRR, no murmurs, rubs, or gallops.  Respiratory: Clear to auscultation bilaterally. GI: Soft, nontender, non-distended  MS: No edema; No deformity. Neuro:  Nonfocal  Psych: Normal affect   Labs    Chemistry Recent Labs Lab 05/22/17 1553 05/23/17 0109 05/24/17 0236  NA 138 141 138  K 4.7 4.9 3.8  CL 106 105 107  CO2 24 25 23   GLUCOSE 300* 255* 133*  BUN 34* 29* 22*  CREATININE 1.77* 1.57* 1.50*  CALCIUM 8.9 9.2 8.4*  PROT 6.7  --   --   ALBUMIN 3.4*  --   --     AST 62*  --   --   ALT 57  --   --   ALKPHOS 70  --   --   BILITOT 0.9  --   --   GFRNONAA 36* 42* 44*  GFRAA 42* 49* 52*  ANIONGAP 8 11 8      Hematology Recent Labs Lab 05/22/17 1553 05/23/17 0109 05/24/17 0236  WBC 8.1 10.0 8.7  RBC 4.17* 4.42 3.85*  HGB 12.0* 12.7* 11.1*  HCT 37.3* 39.9 35.2*  MCV 89.4 90.3 91.4  MCH 28.8 28.7 28.8  MCHC 32.2 31.8 31.5  RDW 13.4 13.7 13.9  PLT 158 170 162    Cardiac Enzymes Recent Labs Lab 05/22/17 1928 05/23/17 0108  TROPONINI 0.20* 0.44*    Recent Labs Lab 05/22/17 1557  TROPIPOC 0.04     BNPNo results for input(s): BNP, PROBNP in the last 168 hours.   DDimer No results for input(s): DDIMER in the last 168 hours.   Radiology  Ct Head Wo Contrast  Result Date: 05/22/2017 CLINICAL DATA:  Fall. EXAM: CT HEAD WITHOUT CONTRAST TECHNIQUE: Contiguous axial images were obtained from the base of the skull through the vertex without intravenous contrast. COMPARISON:  02/11/2017 head CT. FINDINGS: Brain: No evidence of parenchymal hemorrhage or extra-axial fluid collection. No mass lesion, mass effect, or midline shift. No CT evidence of acute infarction. Cerebral volume is age appropriate. No ventriculomegaly. Vascular: No hyperdense vessel or unexpected calcification. Skull: No evidence of calvarial fracture. Sinuses/Orbits: No fluid levels. Mucoperiosteal thickening in the bilateral ethmoidal air cells and sphenoid sinus. Other: Small right parietal scalp contusion. The mastoid air cells are unopacified. IMPRESSION: 1. Small right parietal scalp contusion. 2. No evidence of acute intracranial abnormality. No evidence of calvarial fracture. 3. Mild chronic appearing paranasal sinusitis. Electronically Signed   By: Ilona Sorrel M.D.   On: 05/22/2017 17:00   Dg Chest Port 1 View  Result Date: 05/24/2017 CLINICAL DATA:  Respiratory failure EXAM: PORTABLE CHEST 1 VIEW COMPARISON:  May 23, 2017 FINDINGS: Endotracheal tube and nasogastric  tube have been removed. No pneumothorax. Pacemaker present with lead attached to right ventricle. There is persistent atelectasis/ consolidation in the left lower lobe with small left pleural effusion. Right lung is clear. There is stable cardiomegaly with pulmonary vascularity within normal limits. There is aortic atherosclerosis. No adenopathy. No bone lesions. IMPRESSION: Persistent left lower lobe atelectasis/ consolidation with small left pleural effusion. A degree of pneumonia in the left base cannot be excluded. No new opacity evident. Stable cardiac prominence. There is aortic atherosclerosis. Electronically Signed   By: Lowella Grip III M.D.   On: 05/24/2017 07:37   Dg Chest Port 1 View  Result Date: 05/23/2017 CLINICAL DATA:  Hypoxia EXAM: PORTABLE CHEST 1 VIEW COMPARISON:  May 22, 2017 FINDINGS: Endotracheal tube tip is 3.9 cm above the carina. Nasogastric tube tip and side port are below the diaphragm. Pacemaker lead is attached to the right ventricle. No pneumothorax. There is a small left pleural effusion with left base atelectasis. Lungs elsewhere clear. Heart is mildly enlarged. The pulmonary vascular is normal. No adenopathy. There is aortic atherosclerosis. No bone lesions evident. IMPRESSION: Tube positions as described without pneumothorax. Stable cardiomegaly with aortic atherosclerosis. Small left pleural effusion with left base atelectasis. No frank consolidation or edema appreciable. Electronically Signed   By: Lowella Grip III M.D.   On: 05/23/2017 07:27   Dg Chest Portable 1 View  Result Date: 05/22/2017 CLINICAL DATA:  Intubation.  Trauma. EXAM: PORTABLE CHEST 1 VIEW COMPARISON:  02/23/2017. FINDINGS: Endotracheal tube noted with its tip at the carina. Proximal repositioning of approximately 3 cm suggested. AICD noted in stable position. Stable cardiomegaly. Mild pulmonary vascular congestion. Mild interstitial prominence. Mild component of CHF cannot be excluded. Small  left pleural effusion. Low lung volumes. IMPRESSION: 1. Endotracheal tube noted with its tip at the carina pre and for repositioning of approximately 3 cm should be considered. 2. AICD in stable position. Mild changes of congestive heart failure with mild interstitial edema small left pleural effusion. Low lung volumes. Critical Value/emergent results were called by telephone at the time of interpretation on 05/22/2017 at 3:56 pm to nurse Claiborne Billings, who verbally acknowledged these results.a Electronically Signed   By: Marcello Moores  Register   On: 05/22/2017 15:58    Cardiac Studies   ICD interrogation - his [employer] device is working normally except R waves are reduced, at less than 2 mV  Patient Profile     75  y.o. male admitted with syncope, found to be in ventricular fibrillation, status post delayed but successful ICD defibrillation. He was initially intubated but now extubated and much improved.  Assessment & Plan    1. Ventricular fibrillation cardiac arrest - the patient has had no recurrent ventricular arrhythmias. He will be treated with oral amiodarone 2. ICD lead under sensing - his device is been in place less than 4 months. I would recommend extraction of the current lead secondary to under sensing, and insertion of a new ICD lead. 3. Chronic systolic heart failure - the patient denies worsening heart failure symptoms and has had no chest pain or peripheral edema 4. Ischemic cardiomyopathy - he is status post left heart catheterization several months ago. He has had no anginal symptoms. At this point I would not recommend additional heart catheterization.  Signed, [name], MD  05/24/2017, 9:45 AM  Patient ID: Kelton Pillar, male   DOB: [birth year], 76 y.o.   MRN: [id number]

## 2017-05-24 NOTE — Progress Notes (Signed)
ANTICOAGULATION CONSULT NOTE - Follow Up Consult  Pharmacy Consult for Heparin (Xarelto on hold) Indication: atrial fibrillation  Allergies  Allergen Reactions  . Crestor [Rosuvastatin Calcium] Other (See Comments)    Myalgias, Interfering with Gait  . Entresto [Sacubitril-Valsartan] Other (See Comments)    Angioedema  . Plavix [Clopidogrel]     Nose bleeds, swellings, whelps on legs & back, itching  . Lipitor [Atorvastatin] Other (See Comments)    REACTION: sore legs  . Ancef [Cefazolin] Rash    Describes itching and rash, but said 'it wasn't that bad'    Patient Measurements: Height: 5\' 10"  (177.8 cm) Weight: 229 lb 15 oz (104.3 kg) IBW/kg (Calculated) : 73  Vital Signs: Temp: 100.1 F (37.8 C) (06/09 2045) Temp Source: Oral (06/09 2045) BP: 117/55 (06/09 2300)  Labs:  Recent Labs  05/22/17 1553 05/22/17 1835  05/22/17 1928 05/23/17 0108 05/23/17 0109 05/23/17 1126 05/23/17 2249  HGB 12.0*  --   --   --   --  12.7*  --   --   HCT 37.3*  --   --   --   --  39.9  --   --   PLT 158  --   --   --   --  170  --   --   APTT  --   --   < > 35  --  34 171* 110*  LABPROT 23.5*  --   --   --   --   --   --   --   INR 2.06  --   --   --   --   --   --   --   HEPARINUNFRC  --  >2.10*  --   --  >2.20*  --   --   --   CREATININE 1.77*  --   --   --   --  1.57*  --   --   TROPONINI  --   --   --  0.20* 0.44*  --   --   --   < > = values in this interval not displayed.  Estimated Creatinine Clearance: 50.7 mL/min (A) (by C-G formula based on SCr of 1.57 mg/dL (H)).  Assessment: Heparin while Xarelto on hold s/p cardiac arrest, aPTT is low, currently using aPTT to dose heparin given Xarelto influence on anti-Xa levels, no issues per RN.   Goal of Therapy:  Heparin level 0.3-0.7 units/ml aPTT 66-102 seconds Monitor platelets by anticoagulation protocol: Yes   Plan:  -Dec heparin to 1300 units/hr -0800 aPTT/HL  Narda Bonds 05/24/2017,12:07 AM

## 2017-05-24 NOTE — Progress Notes (Signed)
ANTICOAGULATION CONSULT NOTE - Follow Up Consult  Pharmacy Consult for Heparin (Xarelto on hold) Indication: atrial fibrillation  Allergies  Allergen Reactions  . Crestor [Rosuvastatin Calcium] Other (See Comments)    Myalgias, Interfering with Gait  . Entresto [Sacubitril-Valsartan] Other (See Comments)    Angioedema  . Plavix [Clopidogrel]     Nose bleeds, swellings, whelps on legs & back, itching  . Lipitor [Atorvastatin] Other (See Comments)    REACTION: sore legs  . Ancef [Cefazolin] Rash    Describes itching and rash, but said 'it wasn't that bad'    Patient Measurements: Height: 5\' 10"  (177.8 cm) Weight: 229 lb 4.5 oz (104 kg) IBW/kg (Calculated) : 73  Vital Signs: Temp: 98.7 F (37.1 C) (06/10 2130) Temp Source: Oral (06/10 2130) BP: 119/59 (06/10 2130) Pulse Rate: 60 (06/10 1200)  Labs:  Recent Labs  05/22/17 1553 05/22/17 1835  05/22/17 1928 05/23/17 0108 05/23/17 0109  05/23/17 2249 05/24/17 0236 05/24/17 0707 05/24/17 1918  HGB 12.0*  --   --   --   --  12.7*  --   --  11.1*  --   --   HCT 37.3*  --   --   --   --  39.9  --   --  35.2*  --   --   PLT 158  --   --   --   --  170  --   --  162  --   --   APTT  --   --   < > 35  --  34  < > 110*  --  115* 58*  LABPROT 23.5*  --   --   --   --   --   --   --   --   --   --   INR 2.06  --   --   --   --   --   --   --   --   --   --   HEPARINUNFRC  --  >2.10*  --   --  >2.20*  --   --   --   --  0.73*  --   CREATININE 1.77*  --   --   --   --  1.57*  --   --  1.50*  --   --   TROPONINI  --   --   --  0.20* 0.44*  --   --   --   --   --   --   < > = values in this interval not displayed.  Estimated Creatinine Clearance: 53 mL/min (A) (by C-G formula based on SCr of 1.5 mg/dL (H)).  Assessment: Heparin while Xarelto on hold s/p cardiac arrest.  aPTT continues to be elevated this am after rate adjustment overnight, continue using aPTT to dose heparin given Xarelto influence on anti-Xa levels, no issues  per RN.  Anti-xa level 0.73 - will likely be correlated by 6/11.  Repeat aPTT 58 after most recent rate adjustment.   Goal of Therapy:  Heparin level 0.3-0.7 units/ml aPTT 66-102 seconds Monitor platelets by anticoagulation protocol: Yes   Plan:  - Inc heparin to 1200 units/hr - Repeat labs in am   Vincenza Hews, PharmD, BCPS 05/24/2017, 9:55 PM

## 2017-05-24 NOTE — Progress Notes (Signed)
ANTICOAGULATION CONSULT NOTE - Follow Up Consult  Pharmacy Consult for Heparin (Xarelto on hold) Indication: atrial fibrillation  Allergies  Allergen Reactions  . Crestor [Rosuvastatin Calcium] Other (See Comments)    Myalgias, Interfering with Gait  . Entresto [Sacubitril-Valsartan] Other (See Comments)    Angioedema  . Plavix [Clopidogrel]     Nose bleeds, swellings, whelps on legs & back, itching  . Lipitor [Atorvastatin] Other (See Comments)    REACTION: sore legs  . Ancef [Cefazolin] Rash    Describes itching and rash, but said 'it wasn't that bad'    Patient Measurements: Height: 5\' 10"  (177.8 cm) Weight: 229 lb 4.5 oz (104 kg) IBW/kg (Calculated) : 73  Vital Signs: Temp: 98.6 F (37 C) (06/10 0420) Temp Source: Oral (06/10 0420) BP: 115/55 (06/10 0700)  Labs:  Recent Labs  05/22/17 1553 05/22/17 1835  05/22/17 1928 05/23/17 0108 05/23/17 0109 05/23/17 1126 05/23/17 2249 05/24/17 0236 05/24/17 0707  HGB 12.0*  --   --   --   --  12.7*  --   --  11.1*  --   HCT 37.3*  --   --   --   --  39.9  --   --  35.2*  --   PLT 158  --   --   --   --  170  --   --  162  --   APTT  --   --   < > 35  --  34 171* 110*  --  115*  LABPROT 23.5*  --   --   --   --   --   --   --   --   --   INR 2.06  --   --   --   --   --   --   --   --   --   HEPARINUNFRC  --  >2.10*  --   --  >2.20*  --   --   --   --  0.73*  CREATININE 1.77*  --   --   --   --  1.57*  --   --  1.50*  --   TROPONINI  --   --   --  0.20* 0.44*  --   --   --   --   --   < > = values in this interval not displayed.  Estimated Creatinine Clearance: 53 mL/min (A) (by C-G formula based on SCr of 1.5 mg/dL (H)).  Assessment: Heparin while Xarelto on hold s/p cardiac arrest.  aPTT continues to be elevated this am after rate adjustment overnight, continue using aPTT to dose heparin given Xarelto influence on anti-Xa levels, no issues per RN.  Anti-xa level 0.73 - will likely be correlated by 6/11.  Goal of  Therapy:  Heparin level 0.3-0.7 units/ml aPTT 66-102 seconds Monitor platelets by anticoagulation protocol: Yes   Plan:  -Dec heparin to 1150 units/hr -1800 aPTT  Erin Hearing PharmD., BCPS Clinical Pharmacist Pager [phone] 05/24/2017 9:33 AM    Redmond Pulling, Marlaine Hind 05/24/2017,9:31 AM

## 2017-05-24 NOTE — Progress Notes (Signed)
PULMONARY / CRITICAL CARE MEDICINE   Name: Wayne [name] MRN: [id number] DOB: 08-Aug-1941    ADMISSION DATE:  05/22/2017 CONSULTATION DATE:  05/22/17  REFERRING MD:  Thomasene Lot  CHIEF COMPLAINT:  Cardiac Arrest  HISTORY OF PRESENT ILLNESS:  Pt is encephelopathic; therefore, this HPI is obtained from chart review. Wayne [name] is a 76 y.o. male with PMH as outlined below including but not limited to ischemic cardiomyopathy (echo from March 2019 with EF 20-25%, G1DD) s/p ICD.  [facility]8 cardiac arrest.  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 Per wife, he was pushing a couch and then stopped and said "wait a minute" before he became pale and collapsed. Wife was able to catch him and break his fall. She states that he was breathing however was unresponsive. EMS happened to be outside of the store; therefore, she asked [place] to associate to run and get them. While waiting for EMS, wife states that he took to pick breaths and then started turning blue in the neck and face. Upon EMS arrival, he was noted to have VT rhythm and as they were about to begin CPR, his ICD fired with successful conversion to NSR.  Downtime of roughly 1 minute  Per wife, he was in his usual state of health prior to this. He had not had any recent illness, had not complained of any symptoms.  In ED, he was intubated for airway protection and PCCM was called for admission.  Just prior to my assessment, patient had received PRN Fentanyl and Versed; therefore, neurological exam was difficult to interpret. However, while I was discussing his case and prognosis with family, patient began to come around. He had his eyes open and was looking around the room.  He was able to follow basic commands (squeezed my fingers with bilateral hands, wiggle toes).  Based on his ability to follow commands, decision was made to defer cooling.  Device currently being interrogated by cardiology. Their  initial thoughts was that patient had developed VT with a slower heart rate then ICD was sent to fire at; therefore, there was a delayed response time in ICD firing.  PAST MEDICAL HISTORY :  He  has a past medical history of Adenocarcinoma of prostate ([place]); Arthritis; Cellulitis of left leg; Chronic combined systolic and diastolic CHF (congestive heart failure) ([place]); Colon polyps; CORONARY ATHEROSCLEROSIS NATIVE CORONARY ARTERY; Diabetes mellitus, type 2 ([place]); GERD (gastroesophageal reflux disease); Hematoma of leg; Herpes zoster ophthalmicus; HYPERLIPIDEMIA; HYPERTENSION; Ischemic cardiomyopathy; Noncompliance; Obesity; OSA (obstructive sleep apnea); and Polymyalgia rheumatica ([place]).  PAST SURGICAL HISTORY: He  has a past surgical history that includes Prostate surgery; Coronary stent placement (2012); I&D extremity (06/15/2012); I&D extremity (06/30/2012); I&D extremity (07/20/2012); Cardiac catheterization (01/24/2013); Coronary angioplasty with stent (01/24/2013); percutaneous coronary stent intervention (pci-s) (N/A, 01/24/2013); Cardiac catheterization (N/A, 11/14/2016); Ultrasound guidance for vascular access (11/14/2016); Cardiac catheterization (N/A, 11/17/2016); Cardiac catheterization (N/A, 11/17/2016); and ICD Implant (N/A, 02/12/2017).  Allergies  Allergen Reactions  . Crestor [Rosuvastatin Calcium] Other (See Comments)    Myalgias, Interfering with Gait  . Entresto [Sacubitril-Valsartan] Other (See Comments)    Angioedema  . Plavix [Clopidogrel]     Nose bleeds, swellings, whelps on legs & back, itching  . Lipitor [Atorvastatin] Other (See Comments)    REACTION: sore legs  . Ancef [Cefazolin] Rash    Describes itching and rash, but said 'it wasn't that bad'    No current facility-administered medications  on file prior to encounter.    Current Outpatient Prescriptions on File Prior to Encounter  Medication Sig  . amiodarone (PACERONE) 200 MG tablet 200 mg BID twice daily until 02/18/17,  Then take 200 mg daily. (Patient taking differently: Take 200 mg by mouth 2 (two) times daily. )  . carvedilol (COREG) 12.5 MG tablet Take 1 tablet (12.5 mg total) by mouth 2 (two) times daily. (Patient taking differently: Take 6.25 mg by mouth 2 (two) times daily. )  . digoxin (LANOXIN) 0.125 MG tablet Take 0.5 tablets (0.0625 mg total) by mouth daily.  . diphenhydramine-acetaminophen (TYLENOL PM) 25-500 MG TABS tablet Take 1 tablet by mouth at bedtime as needed (sleep).  . Evolocumab (REPATHA SURECLICK) 932 MG/ML SOAJ Inject 1 pen into the skin every 14 (fourteen) days.  . furosemide (LASIX) 20 MG tablet Take 4 tablets (80 mg total) by mouth daily.  Marland Kitchen glimepiride (AMARYL) 2 MG tablet TAKE 1 TABLET(2 MG) BY MOUTH DAILY BEFORE BREAKFAST  . Lancets (ACCU-CHEK SOFT TOUCH) lancets Check blood sugars 1-2 times per day. DX: E11.65  . losartan (COZAAR) 25 MG tablet Take 1 tablet (25 mg total) by mouth 2 (two) times daily.  . Multiple Vitamins-Minerals (CENTRUM SILVER ADULT 50+) TABS Take 1 tablet by mouth daily.  . Omega-3 Fatty Acids (FISH OIL) 1000 MG CAPS Take 1,000 mg by mouth 2 (two) times daily.   . Rivaroxaban (XARELTO) 15 MG TABS tablet Take 1 tablet (15 mg total) by mouth daily.  Marland Kitchen spironolactone (ALDACTONE) 25 MG tablet Take 0.5 tablets (12.5 mg total) by mouth daily.  . ticagrelor (BRILINTA) 60 MG TABS tablet Take 1 tablet (60 mg total) by mouth 2 (two) times daily.  . pravastatin (PRAVACHOL) 20 MG tablet Take 1 tablet (20 mg total) by mouth every evening.  . traMADol (ULTRAM) 50 MG tablet Take 1 tablet (50 mg total) by mouth every 6 (six) hours as needed for moderate pain (at device insertion site). (Patient not taking: Reported on 05/23/2017)    FAMILY HISTORY:  His indicated that his mother is deceased. He indicated that his father is deceased.    SOCIAL HISTORY: He  reports that he quit smoking about 28 years ago. His smoking use included Cigarettes. He has a 6.00 pack-year smoking  history. He has never used smokeless tobacco. He reports that he does not drink alcohol or use drugs.  REVIEW OF SYSTEMS:  Unable to obtain as patient is on vent  SUBJECTIVE:   Extubated yesterday. Stable resp status   VITAL SIGNS: BP (!) 113/50 (BP Location: Right Arm)   Pulse 60   Temp 98.3 F (36.8 C) (Oral)   Resp 11   Ht 5\' 10"  (1.778 m)   Wt 229 lb 4.5 oz (104 kg)   SpO2 96%   BMI 32.90 kg/m   HEMODYNAMICS:    VENTILATOR SETTINGS:    INTAKE / OUTPUT: I/O last 3 completed shifts: In: 2897 [P.O.:600; I.V.:2297] Out: 2125 [Urine:2125]   PHYSICAL EXAMINATION: Blood pressure (!) 113/50, pulse 60, temperature 98.3 F (36.8 C), temperature source Oral, resp. rate 11, height 5\' 10"  (1.778 m), weight 229 lb 4.5 oz (104 kg), SpO2 96 %. Gen:      No acute distress HEENT:  EOMI, sclera anicteric Neck:     No masses; no thyromegaly Lungs:    Clear to auscultation bilaterally; normal respiratory effort CV:         Regular rate and rhythm; no murmurs Abd:      +  bowel sounds; soft, non-tender; no palpable masses, no distension Ext:    No edema; adequate peripheral perfusion Skin:      Warm and dry; no rash Neuro: alert and oriented x 3 Psych: normal mood and affect  LABS:  BMET  Recent Labs Lab 05/22/17 1553 05/23/17 0109 05/24/17 0236  NA 138 141 138  K 4.7 4.9 3.8  CL 106 105 107  CO2 24 25 23   BUN 34* 29* 22*  CREATININE 1.77* 1.57* 1.50*  GLUCOSE 300* 255* 133*    Electrolytes  Recent Labs Lab 05/22/17 1553 05/23/17 0109 05/24/17 0236  CALCIUM 8.9 9.2 8.4*  MG  --  2.2 2.1  PHOS  --  3.3 3.1    CBC  Recent Labs Lab 05/22/17 1553 05/23/17 0109 05/24/17 0236  WBC 8.1 10.0 8.7  HGB 12.0* 12.7* 11.1*  HCT 37.3* 39.9 35.2*  PLT 158 170 162    Coag's  Recent Labs Lab 05/22/17 1553  05/23/17 1126 05/23/17 2249 05/24/17 0707  APTT  --   < > 171* 110* 115*  INR 2.06  --   --   --   --   < > = values in this interval not  displayed.  Sepsis Markers  Recent Labs Lab 05/22/17 1559  LATICACIDVEN 3.17*    ABG  Recent Labs Lab 05/22/17 1655 05/23/17 0342  PHART 7.395 7.507*  PCO2ART 38.0 28.7*  PO2ART 201.0* 98.8    Liver Enzymes  Recent Labs Lab 05/22/17 1553  AST 62*  ALT 57  ALKPHOS 70  BILITOT 0.9  ALBUMIN 3.4*    Cardiac Enzymes  Recent Labs Lab 05/22/17 1928 05/23/17 0108  TROPONINI 0.20* 0.44*    Glucose  Recent Labs Lab 05/23/17 1625 05/23/17 2003 05/24/17 0039 05/24/17 0417 05/24/17 0839 05/24/17 1218  GLUCAP 133* 122* 112* 132* 142* 258*    Imaging Dg Chest Port 1 View  Result Date: 05/24/2017 CLINICAL DATA:  Respiratory failure EXAM: PORTABLE CHEST 1 VIEW COMPARISON:  May 23, 2017 FINDINGS: Endotracheal tube and nasogastric tube have been removed. No pneumothorax. Pacemaker present with lead attached to right ventricle. There is persistent atelectasis/ consolidation in the left lower lobe with small left pleural effusion. Right lung is clear. There is stable cardiomegaly with pulmonary vascularity within normal limits. There is aortic atherosclerosis. No adenopathy. No bone lesions. IMPRESSION: Persistent left lower lobe atelectasis/ consolidation with small left pleural effusion. A degree of pneumonia in the left base cannot be excluded. No new opacity evident. Stable cardiac prominence. There is aortic atherosclerosis. Electronically Signed   By: Lowella Grip III M.D.   On: 05/24/2017 07:37    STUDIES:  CT head 6/8 > no acute process. CXR 6/8 > mild interstitial edema. Echo 6/9 >   CULTURES: Blood 6/8 >  Sputum 6/8 >  Urine 6/8 >   ANTIBIOTICS: None.  SIGNIFICANT EVENTS: 6/8 > admitted after cardiac arrest.  LINES/TUBES: ETT 6/8 >   DISCUSSION: 76 y.o. male admitted 6/8 after VT cardiac arrest. Had delayed response of ICD firing (felt to be due to VT at slower rate than ICD was set to fire at). Required intubation in ED due to inability to  protect airway. Cooling deferred given his ability to follow basic commands.  ASSESSMENT / PLAN:  PULMONARY A: Respiratory insufficiency - due to the inability to protect airway in setting of cardiac arrest. Mild interstitial edema. Hx OSA. P:   Wean down O2 as tolerated Offer CPAP at night. Pt is reportedly  non compliant  CARDIOVASCULAR A:  VT arrest - with downtime a roughly 1 minute before ICD fired. Hx ICM (echo from March 2019 with EF 20-25%, G1DD), HTN, HLD, PAF, CAD. P:  Cardiology following. Will need ICD lead replacement Heparin drip Continue amiodarone. Restart home cardiac meds as per cardiology  RENAL A:   AKI. P:   [place] urine output and Cr  GASTROINTESTINAL A:   GI prophylaxis. Nutrition. P:   Start diet  HEMATOLOGIC / ONCOLOGIC A:   VTE Prophylaxis. Hx adenocarcinoma of prostate - s/p seed implants. P:  SCDs, heparin gtt  INFECTIOUS A:   No indication of infection. P:   Monitor off antibiotics  ENDOCRINE A:   DM. P:   SSI. Restart glimepride  NEUROLOGIC A:   Acute encephalopathy - due to sedation. P:   Resolved off sedation.  Family updated: Wife, Yolanda Bonine, Brother all updated at bedside 6/9. No family at bedside 6/10.    Marshell Garfinkel MD [facility] Pager [phone] If no answer or after 3pm call: [phone] 05/24/2017, 12:44 PM

## 2017-05-25 ENCOUNTER — Inpatient Hospital Stay (HOSPITAL_COMMUNITY): Payer: Medicare Other

## 2017-05-25 ENCOUNTER — Inpatient Hospital Stay (HOSPITAL_COMMUNITY): Admission: RE | Admit: 2017-05-25 | Payer: Medicare Other | Source: Ambulatory Visit

## 2017-05-25 DIAGNOSIS — J9601 Acute respiratory failure with hypoxia: Secondary | ICD-10-CM

## 2017-05-25 DIAGNOSIS — J810 Acute pulmonary edema: Secondary | ICD-10-CM

## 2017-05-25 DIAGNOSIS — I472 Ventricular tachycardia, unspecified: Secondary | ICD-10-CM

## 2017-05-25 DIAGNOSIS — I255 Ischemic cardiomyopathy: Secondary | ICD-10-CM

## 2017-05-25 DIAGNOSIS — J9600 Acute respiratory failure, unspecified whether with hypoxia or hypercapnia: Secondary | ICD-10-CM

## 2017-05-25 DIAGNOSIS — I4891 Unspecified atrial fibrillation: Secondary | ICD-10-CM

## 2017-05-25 DIAGNOSIS — R0902 Hypoxemia: Secondary | ICD-10-CM

## 2017-05-25 LAB — CBC
HEMATOCRIT: 35.6 % — AB (ref 39.0–52.0)
HEMOGLOBIN: 11.2 g/dL — AB (ref 13.0–17.0)
MCH: 28.8 pg (ref 26.0–34.0)
MCHC: 31.5 g/dL (ref 30.0–36.0)
MCV: 91.5 fL (ref 78.0–100.0)
Platelets: 146 10*3/uL — ABNORMAL LOW (ref 150–400)
RBC: 3.89 MIL/uL — AB (ref 4.22–5.81)
RDW: 13.6 % (ref 11.5–15.5)
WBC: 7.2 10*3/uL (ref 4.0–10.5)

## 2017-05-25 LAB — BASIC METABOLIC PANEL
Anion gap: 6 (ref 5–15)
BUN: 16 mg/dL (ref 6–20)
CHLORIDE: 107 mmol/L (ref 101–111)
CO2: 25 mmol/L (ref 22–32)
CREATININE: 1.27 mg/dL — AB (ref 0.61–1.24)
Calcium: 8.7 mg/dL — ABNORMAL LOW (ref 8.9–10.3)
GFR calc non Af Amer: 54 mL/min — ABNORMAL LOW (ref >60)
Glucose, Bld: 152 mg/dL — ABNORMAL HIGH (ref 65–99)
POTASSIUM: 4.5 mmol/L (ref 3.5–5.1)
SODIUM: 138 mmol/L (ref 135–145)

## 2017-05-25 LAB — GLUCOSE, CAPILLARY
GLUCOSE-CAPILLARY: 117 mg/dL — AB (ref 65–99)
GLUCOSE-CAPILLARY: 126 mg/dL — AB (ref 65–99)
Glucose-Capillary: 126 mg/dL — ABNORMAL HIGH (ref 65–99)
Glucose-Capillary: 132 mg/dL — ABNORMAL HIGH (ref 65–99)
Glucose-Capillary: 196 mg/dL — ABNORMAL HIGH (ref 65–99)
Glucose-Capillary: 315 mg/dL — ABNORMAL HIGH (ref 65–99)

## 2017-05-25 LAB — PHOSPHORUS: PHOSPHORUS: 2.4 mg/dL — AB (ref 2.5–4.6)

## 2017-05-25 LAB — HEPARIN LEVEL (UNFRACTIONATED)
HEPARIN UNFRACTIONATED: 0.27 [IU]/mL — AB (ref 0.30–0.70)
Heparin Unfractionated: 0.24 IU/mL — ABNORMAL LOW (ref 0.30–0.70)

## 2017-05-25 LAB — APTT: aPTT: 62 seconds — ABNORMAL HIGH (ref 24–36)

## 2017-05-25 LAB — MAGNESIUM: MAGNESIUM: 2.2 mg/dL (ref 1.7–2.4)

## 2017-05-25 MED ORDER — SPIRONOLACTONE 25 MG PO TABS
12.5000 mg | ORAL_TABLET | Freq: Every day | ORAL | Status: DC
  Administered 2017-05-25 – 2017-05-27 (×2): 12.5 mg via ORAL
  Filled 2017-05-25 (×2): qty 1

## 2017-05-25 MED ORDER — HEPARIN (PORCINE) IN NACL 100-0.45 UNIT/ML-% IJ SOLN
1500.0000 [IU]/h | INTRAMUSCULAR | Status: DC
  Administered 2017-05-25: 1300 [IU]/h via INTRAVENOUS
  Filled 2017-05-25: qty 250

## 2017-05-25 MED ORDER — INSULIN ASPART 100 UNIT/ML ~~LOC~~ SOLN
0.0000 [IU] | Freq: Three times a day (TID) | SUBCUTANEOUS | Status: DC
  Administered 2017-05-25: 3 [IU] via SUBCUTANEOUS
  Administered 2017-05-25: 11 [IU] via SUBCUTANEOUS
  Administered 2017-05-26: 2 [IU] via SUBCUTANEOUS
  Administered 2017-05-27: 8 [IU] via SUBCUTANEOUS

## 2017-05-25 MED ORDER — INSULIN ASPART 100 UNIT/ML ~~LOC~~ SOLN: 0.0000 [IU] | Freq: Every day | SUBCUTANEOUS | Status: DC

## 2017-05-25 MED ORDER — AMIODARONE HCL 200 MG PO TABS
200.0000 mg | ORAL_TABLET | Freq: Two times a day (BID) | ORAL | Status: DC
  Administered 2017-05-25 – 2017-05-27 (×4): 200 mg via ORAL
  Filled 2017-05-25 (×4): qty 1

## 2017-05-25 MED ORDER — GENTAMICIN SULFATE 40 MG/ML IJ SOLN
80.0000 mg | INTRAMUSCULAR | Status: AC
  Administered 2017-05-26: 80 mg

## 2017-05-25 MED ORDER — LOSARTAN POTASSIUM 25 MG PO TABS
12.5000 mg | ORAL_TABLET | Freq: Every day | ORAL | Status: DC
  Administered 2017-05-25 – 2017-05-27 (×2): 12.5 mg via ORAL
  Filled 2017-05-25 (×2): qty 1

## 2017-05-25 MED ORDER — SODIUM CHLORIDE 0.9 % IV SOLN
INTRAVENOUS | Status: DC
  Administered 2017-05-26: 06:00:00 via INTRAVENOUS

## 2017-05-25 MED ORDER — MORPHINE SULFATE (PF) 2 MG/ML IV SOLN
2.0000 mg | Freq: Four times a day (QID) | INTRAVENOUS | Status: DC | PRN
  Administered 2017-05-25 – 2017-05-27 (×4): 2 mg via INTRAVENOUS
  Filled 2017-05-25 (×4): qty 1

## 2017-05-25 MED ORDER — VANCOMYCIN HCL 10 G IV SOLR
1500.0000 mg | INTRAVENOUS | Status: DC
  Administered 2017-05-26: 1500 mg via INTRAVENOUS
  Filled 2017-05-25: qty 1500

## 2017-05-25 MED ORDER — ACETAMINOPHEN 325 MG PO TABS: 650.0000 mg | ORAL_TABLET | Freq: Four times a day (QID) | ORAL | Status: DC | PRN

## 2017-05-25 NOTE — Progress Notes (Signed)
Patient ID: Wayne [name], male   DOB: 07/02/1941, 76 y.o.   MRN: [id number]    SUBJECTIVE:  Admitted with VF arrest. ICD delayed sensing. EP planning to replace lead in am.  Overall feeling ok. Denies SOB/Orthopnea.     Scheduled Meds: . digoxin  0.0625 mg Oral Daily  . glimepiride  2 mg Oral Q breakfast  . insulin aspart  0-15 Units Subcutaneous TID WC  . insulin aspart  0-5 Units Subcutaneous QHS  . pantoprazole  40 mg Oral Daily  . pravastatin  20 mg Oral q1800  . ticagrelor  60 mg Oral BID   Continuous Infusions: . sodium chloride    . heparin 1,300 Units/hr (05/25/17 0730)   PRN Meds:.sodium chloride    Vitals:   05/25/17 0500 05/25/17 0600 05/25/17 0700 05/25/17 0800  BP: (!) 122/48 128/60 (!) 123/57 (!) 126/55  Pulse:    69  Resp: 18 10 17 15   Temp:   98.3 F (36.8 C)   TempSrc:   Oral   SpO2: 94% 97% 96% 98%  Weight:      Height:        Intake/Output Summary (Last 24 hours) at 05/25/17 0854 Last data filed at 05/25/17 0800  Gross per 24 hour  Intake           756.55 ml  Output             1360 ml  Net          -603.45 ml    LABS: Basic Metabolic Panel:  Recent Labs  05/24/17 0236 05/25/17 0251  NA 138 138  K 3.8 4.5  CL 107 107  CO2 23 25  GLUCOSE 133* 152*  BUN 22* 16  CREATININE 1.50* 1.27*  CALCIUM 8.4* 8.7*  MG 2.1 2.2  PHOS 3.1 2.4*   Liver Function Tests:  Recent Labs  05/22/17 1553  AST 62*  ALT 57  ALKPHOS 70  BILITOT 0.9  PROT 6.7  ALBUMIN 3.4*   No results for input(s): LIPASE, AMYLASE in the last 72 hours. CBC:  Recent Labs  05/22/17 1553  05/24/17 0236 05/25/17 0251  WBC 8.1  < > 8.7 7.2  NEUTROABS 6.2  --   --   --   HGB 12.0*  < > 11.1* 11.2*  HCT 37.3*  < > 35.2* 35.6*  MCV 89.4  < > 91.4 91.5  PLT 158  < > 162 146*  < > = values in this interval not displayed. Cardiac Enzymes:  Recent Labs  05/22/17 1928 05/23/17 0108  TROPONINI 0.20* 0.44*   BNP: Invalid input(s): POCBNP D-Dimer: No results  for input(s): DDIMER in the last 72 hours. Hemoglobin A1C:  Recent Labs  05/22/17 1928  HGBA1C 8.9*   Fasting Lipid Panel: No results for input(s): CHOL, HDL, LDLCALC, TRIG, CHOLHDL, LDLDIRECT in the last 72 hours. Thyroid Function Tests: No results for input(s): TSH, T4TOTAL, T3FREE, THYROIDAB in the last 72 hours.  Invalid input(s): FREET3 Anemia Panel: No results for input(s): VITAMINB12, FOLATE, FERRITIN, TIBC, IRON, RETICCTPCT in the last 72 hours.  RADIOLOGY: Ct Head Wo Contrast  Result Date: 05/22/2017 CLINICAL DATA:  Fall. EXAM: CT HEAD WITHOUT CONTRAST TECHNIQUE: Contiguous axial images were obtained from the base of the skull through the vertex without intravenous contrast. COMPARISON:  02/11/2017 head CT. FINDINGS: Brain: No evidence of parenchymal hemorrhage or extra-axial fluid collection. No mass lesion, mass effect, or midline shift. No CT evidence of acute infarction. Cerebral volume  is age appropriate. No ventriculomegaly. Vascular: No hyperdense vessel or unexpected calcification. Skull: No evidence of calvarial fracture. Sinuses/Orbits: No fluid levels. Mucoperiosteal thickening in the bilateral ethmoidal air cells and sphenoid sinus. Other: Small right parietal scalp contusion. The mastoid air cells are unopacified. IMPRESSION: 1. Small right parietal scalp contusion. 2. No evidence of acute intracranial abnormality. No evidence of calvarial fracture. 3. Mild chronic appearing paranasal sinusitis. Electronically Signed   By: Ilona Sorrel M.D.   On: 05/22/2017 17:00   Dg Chest Port 1 View  Result Date: 05/25/2017 CLINICAL DATA:  Follow-up examination for acute respiratory failure. EXAM: PORTABLE CHEST 1 VIEW COMPARISON:  Prior radiograph from 05/24/2017. FINDINGS: Stable cardiomegaly. Left-sided pacemaker/AICD again noted. Mediastinal silhouette unchanged. Lungs hypoinflated. Persistent left basilar opacity, either atelectasis or consolidation. Finding slightly improved. No  other focal airspace disease. No pulmonary edema. Small residual left pleural effusion. No pneumothorax. No acute osseus abnormality. IMPRESSION: 1. Persistent but improved left basilar opacity, which may reflect improved atelectasis or infiltrate. Small residual left pleural effusion. 2. Otherwise stable appearance of the chest. Electronically Signed   By: Jeannine Boga M.D.   On: 05/25/2017 06:53   Dg Chest Port 1 View  Result Date: 05/24/2017 CLINICAL DATA:  Respiratory failure EXAM: PORTABLE CHEST 1 VIEW COMPARISON:  May 23, 2017 FINDINGS: Endotracheal tube and nasogastric tube have been removed. No pneumothorax. Pacemaker present with lead attached to right ventricle. There is persistent atelectasis/ consolidation in the left lower lobe with small left pleural effusion. Right lung is clear. There is stable cardiomegaly with pulmonary vascularity within normal limits. There is aortic atherosclerosis. No adenopathy. No bone lesions. IMPRESSION: Persistent left lower lobe atelectasis/ consolidation with small left pleural effusion. A degree of pneumonia in the left base cannot be excluded. No new opacity evident. Stable cardiac prominence. There is aortic atherosclerosis. Electronically Signed   By: Lowella Grip III M.D.   On: 05/24/2017 07:37   Dg Chest Port 1 View  Result Date: 05/23/2017 CLINICAL DATA:  Hypoxia EXAM: PORTABLE CHEST 1 VIEW COMPARISON:  May 22, 2017 FINDINGS: Endotracheal tube tip is 3.9 cm above the carina. Nasogastric tube tip and side port are below the diaphragm. Pacemaker lead is attached to the right ventricle. No pneumothorax. There is a small left pleural effusion with left base atelectasis. Lungs elsewhere clear. Heart is mildly enlarged. The pulmonary vascular is normal. No adenopathy. There is aortic atherosclerosis. No bone lesions evident. IMPRESSION: Tube positions as described without pneumothorax. Stable cardiomegaly with aortic atherosclerosis. Small left  pleural effusion with left base atelectasis. No frank consolidation or edema appreciable. Electronically Signed   By: Lowella Grip III M.D.   On: 05/23/2017 07:27   Dg Chest Portable 1 View  Result Date: 05/22/2017 CLINICAL DATA:  Intubation.  Trauma. EXAM: PORTABLE CHEST 1 VIEW COMPARISON:  02/23/2017. FINDINGS: Endotracheal tube noted with its tip at the carina. Proximal repositioning of approximately 3 cm suggested. AICD noted in stable position. Stable cardiomegaly. Mild pulmonary vascular congestion. Mild interstitial prominence. Mild component of CHF cannot be excluded. Small left pleural effusion. Low lung volumes. IMPRESSION: 1. Endotracheal tube noted with its tip at the carina pre and for repositioning of approximately 3 cm should be considered. 2. AICD in stable position. Mild changes of congestive heart failure with mild interstitial edema small left pleural effusion. Low lung volumes. Critical Value/emergent results were called by telephone at the time of interpretation on 05/22/2017 at 3:56 pm to nurse Claiborne Billings, who verbally acknowledged these results.a  Electronically Signed   By: Marcello Moores  Register   On: 05/22/2017 15:58    PHYSICAL EXAM General:  Well appearing. No resp difficulty. In Bed.  HEENT: normal Neck: supple. no JVD. Carotids 2+ bilat; no bruits. No lymphadenopathy or thryomegaly appreciated. Cor: PMI nondisplaced. Regular rate & rhythm. No rubs, gallops or murmurs. Lungs: clear Abdomen: soft, round,  nontender, nondistended. No hepatosplenomegaly. No bruits or masses. Good bowel sounds. Extremities: no cyanosis, clubbing, rash, edema. R and LLE SCDs.  Neuro: alert & orientedx3, cranial nerves grossly intact. moves all 4 extremities w/o difficulty. Affect pleasant  TELEMETRY:  Personally reviewed NSR 60s occasional PVC  ASSESSMENT AND PLAN: 1. Ventricular fibrillation arrest: This is his 2nd episode.  Has Medtronic ICD, there was prolongation of time to discharge due to  undersensing.   Plan for Lead replacement tomorrow per EP  IV amio stopped 6/9. Continue amio 200 mg twice a day. .  2. Acute respiratory failure: Intubated for airways control with altered mental status.  Stable on 2 liters oxygen.   3. Chronic systolic CHF: Ischemic cardiomyopathy.  3/18 echo with EF 20-25%, diffuse hypokinesis.  CPX in 10/17 with severe HF limitation.  RHC in 12/17 with elevated filling pressures and low cardiac output.  He is now s/p PCI to proximal to mid LAD with much improved symptoms at home prior to this event.  Medtronic ICD in 3/18 after vfib arrest.   -Continue digoxin 0.0625 mg daily.   Restart 12.5 mg spiro and  12.5 mg losartan daily.  ( Home meds Lasix 80 mg daily, spironolactone 25 mg daily, losartan 25 mg bid, Coreg)     4. CAD:  Now s/p PCI to proximal LAD.  He had an occluded RCA but there were left to right collaterals. No CP . Continue statin. Should continue Repatha at home.    - Anticoagulation for atrial fibrillation with antiplatelet agents is complicated with Mr Hanton. He is allergic to Plavix, hadspontaneous gastrocnemius hemorrhage with prasugrel.   He has been on ticagrelor 60 bid + Xarelto 15 based on [facility] data.  He has now completed 6 months of ticagrelor post-PCI.   Continue  ticagrelor + heparin gtt for now, when he goes home think we can stop ticagrelor and have him on Xarelto 20 mg daily.   5. Atrial fibrillation: Maintaining NSR.  Continue combination of ticagrelor 60 mg bid and Xarelto 15 daily.  When he goes home, think we can drop ticagrelor and just use Xarelto 20 mg daily.  6. OSA: Not using CPAP.   Amy Clegg NP-C  05/25/2017 8:54 AM  Patient seen and examined with Darrick Grinder, NP. We discussed all aspects of the encounter. I agree with the assessment and plan as stated above.   HF remains stable. Rhythm stable on amio. (now on po).   Planning ICD lead replacement tomorrow per Dr. Lovena Le.   CAD stable. Remains in NSR. As above will  stop ticagrelor on d/c and increase Xarelto to 20 daily.   Glori Bickers, MD  9:26 AM

## 2017-05-25 NOTE — Care Management Note (Signed)
Case Management Note Marvetta Gibbons RN, BSN Unit 2W-Case Manager- [place] coverage [phone]  Patient Details  Name: Wayne [name] MRN: [id number] Date of Birth: January 11, 1941  Subjective/Objective:  Pt admitted s/p VF cardiac arrest- ICD fired with successful conversion to SR                 Action/Plan: PTA pt lived at home with wife- active/independent- CM to follow for d/c needs  Expected Discharge Date:                  Expected Discharge Plan:  Home/Self Care  In-House Referral:     Discharge planning Services  CM Consult  Post Acute Care Choice:    Choice offered to:     DME Arranged:    DME Agency:     HH Arranged:    [place] Agency:     Status of Service:  In process, will continue to follow  If discussed at Long Length of Stay Meetings, dates discussed:    Discharge Disposition:   Additional Comments:  Dawayne Patricia, RN 05/25/2017, 10:50 AM

## 2017-05-25 NOTE — Progress Notes (Signed)
ANTICOAGULATION CONSULT NOTE - Follow Up Consult  Pharmacy Consult for Heparin (Xarelto on hold) Indication: atrial fibrillation  Allergies  Allergen Reactions  . Crestor [Rosuvastatin Calcium] Other (See Comments)    Myalgias, Interfering with Gait  . Entresto [Sacubitril-Valsartan] Other (See Comments)    Angioedema  . Plavix [Clopidogrel]     Nose bleeds, swellings, whelps on legs & back, itching  . Lipitor [Atorvastatin] Other (See Comments)    REACTION: sore legs  . Ancef [Cefazolin] Rash    Describes itching and rash, but said 'it wasn't that bad'    Patient Measurements: Height: 5\' 10"  (177.8 cm) Weight: 227 lb 12.8 oz (103.3 kg) IBW/kg (Calculated) : 73  Vital Signs: Temp: 97.6 F (36.4 C) (06/11 1100) Temp Source: Oral (06/11 1100) BP: 122/56 (06/11 1300) Pulse Rate: 70 (06/11 1300)  Labs:  Recent Labs  05/22/17 1928  05/23/17 0108  05/23/17 0109  05/24/17 0236 05/24/17 0707 05/24/17 1918 05/25/17 0251 05/25/17 1755  HGB  --   --   --   < > 12.7*  --  11.1*  --   --  11.2*  --   HCT  --   --   --   --  39.9  --  35.2*  --   --  35.6*  --   PLT  --   --   --   --  170  --  162  --   --  146*  --   APTT 35  --   --   --  34  < >  --  115* 58* 62*  --   HEPARINUNFRC  --   < > >2.20*  --   --   --   --  0.73*  --  0.24* 0.27*  CREATININE  --   --   --   --  1.57*  --  1.50*  --   --  1.27*  --   TROPONINI 0.20*  --  0.44*  --   --   --   --   --   --   --   --   < > = values in this interval not displayed.  Estimated Creatinine Clearance: 62.4 mL/min (A) (by C-G formula based on SCr of 1.27 mg/dL (H)).  Assessment: Heparin while Xarelto on hold s/p cardiac arrest.  Heparin level still slightly low this afternoon after rate increase this morning.   Goal of Therapy:  Heparin level 0.3-0.7 units/ml Monitor platelets by anticoagulation protocol: Yes   Plan:  - Inc heparin to 1500 units/hr - Recheck heparin level with am labs  Erin Hearing PharmD.,  BCPS Clinical Pharmacist Pager [phone] 05/25/2017 6:49 PM

## 2017-05-25 NOTE — Progress Notes (Signed)
ANTICOAGULATION CONSULT NOTE - Follow Up Consult  Pharmacy Consult for Heparin (Xarelto on hold) Indication: atrial fibrillation  Allergies  Allergen Reactions  . Crestor [Rosuvastatin Calcium] Other (See Comments)    Myalgias, Interfering with Gait  . Entresto [Sacubitril-Valsartan] Other (See Comments)    Angioedema  . Plavix [Clopidogrel]     Nose bleeds, swellings, whelps on legs & back, itching  . Lipitor [Atorvastatin] Other (See Comments)    REACTION: sore legs  . Ancef [Cefazolin] Rash    Describes itching and rash, but said 'it wasn't that bad'    Patient Measurements: Height: 5\' 10"  (177.8 cm) Weight: 227 lb 12.8 oz (103.3 kg) IBW/kg (Calculated) : 73  Vital Signs: Temp: 97.6 F (36.4 C) (06/11 1100) Temp Source: Oral (06/11 1100) BP: 122/56 (06/11 1300) Pulse Rate: 70 (06/11 1300)  Labs:  Recent Labs  05/22/17 1553  05/22/17 1928 05/23/17 0108 05/23/17 0109  05/24/17 0236 05/24/17 0707 05/24/17 1918 05/25/17 0251  HGB 12.0*  --   --   --  12.7*  --  11.1*  --   --  11.2*  HCT 37.3*  --   --   --  39.9  --  35.2*  --   --  35.6*  PLT 158  --   --   --  170  --  162  --   --  146*  APTT  --   < > 35  --  34  < >  --  115* 58* 62*  LABPROT 23.5*  --   --   --   --   --   --   --   --   --   INR 2.06  --   --   --   --   --   --   --   --   --   HEPARINUNFRC  --   < >  --  >2.20*  --   --   --  0.73*  --  0.24*  CREATININE 1.77*  --   --   --  1.57*  --  1.50*  --   --  1.27*  TROPONINI  --   --  0.20* 0.44*  --   --   --   --   --   --   < > = values in this interval not displayed.  Estimated Creatinine Clearance: 62.4 mL/min (A) (by C-G formula based on SCr of 1.27 mg/dL (H)).  Assessment: Heparin while Xarelto on hold s/p cardiac arrest.  aPTT and heparin level both slightly low this AM.   Goal of Therapy:  Heparin level 0.3-0.7 units/ml aPTT 66-102 seconds Monitor platelets by anticoagulation protocol: Yes   Plan:  - Inc heparin to 1300  units/hr - Recheck heparin level toinght. -Daily heparin level (no more PTTs necessary) and CBC.   Uvaldo Rising, BCPS  Clinical Pharmacist Pager [phone]  05/25/2017 2:23 PM

## 2017-05-25 NOTE — Progress Notes (Signed)
PULMONARY / CRITICAL CARE MEDICINE   Name: Wayne [name] MRN: [id number] DOB: January 14, 1941    ADMISSION DATE:  05/22/2017 CONSULTATION DATE:  05/22/17  REFERRING MD:  Thomasene Lot  CHIEF COMPLAINT:  Cardiac Arrest  HISTORY OF PRESENT ILLNESS:  Pt is encephelopathic; therefore, this HPI is obtained from chart review. Wayne [name] is a 76 y.o. male with PMH as outlined below including but not limited to ischemic cardiomyopathy (echo from March 2019 with EF 20-25%, G1DD) s/p ICD.  He presented to [facility] ED 6/8 cardiac arrest.  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 Per wife, he was pushing a couch and then stopped and said "wait a minute" before he became pale and collapsed. Wife was able to catch him and break his fall. She states that he was breathing however was unresponsive. EMS happened to be outside of the store; therefore, she asked [place] to associate to run and get them. While waiting for EMS, wife states that he took to pick breaths and then started turning blue in the neck and face. Upon EMS arrival, he was noted to have VT rhythm and as they were about to begin CPR, his ICD fired with successful conversion to NSR.  Downtime of roughly 1 minute  Per wife, he was in his usual state of health prior to this. He had not had any recent illness, had not complained of any symptoms.  In ED, he was intubated for airway protection and PCCM was called for admission.  SUBJECTIVE:   Feeling good this morning. Chest pain from CPR, otherwise feels fine.   VITAL SIGNS: BP (!) 126/55 (BP Location: Right Arm)   Pulse 69   Temp 98.3 F (36.8 C) (Oral)   Resp 15   Ht 5\' 10"  (1.778 m)   Wt 103.3 kg (227 lb 12.8 oz)   SpO2 98%   BMI 32.69 kg/m   HEMODYNAMICS:    VENTILATOR SETTINGS:    INTAKE / OUTPUT: I/O last 3 completed shifts: In: 1313.3 [P.O.:840; I.V.:473.3] Out: 2160 [Urine:2160]  PHYSICAL EXAMINATION: General:  Overweigh male in NAD Neuro:  Alert,  oriented, non-focal HEENT:  [place]/AT, No JVD noted, PERRL Cardiovascular:  RRR, no MRG Lungs:  Clear, unlabored. Abdomen:  Soft, non-distended, non-tender Musculoskeletal:  No acute deformity Skin:  Intact, MMM  BMET  Recent Labs Lab 05/23/17 0109 05/24/17 0236 05/25/17 0251  NA 141 138 138  K 4.9 3.8 4.5  CL 105 107 107  CO2 25 23 25   BUN 29* 22* 16  CREATININE 1.57* 1.50* 1.27*  GLUCOSE 255* 133* 152*    Electrolytes  Recent Labs Lab 05/23/17 0109 05/24/17 0236 05/25/17 0251  CALCIUM 9.2 8.4* 8.7*  MG 2.2 2.1 2.2  PHOS 3.3 3.1 2.4*    CBC  Recent Labs Lab 05/23/17 0109 05/24/17 0236 05/25/17 0251  WBC 10.0 8.7 7.2  HGB 12.7* 11.1* 11.2*  HCT 39.9 35.2* 35.6*  PLT 170 162 146*    Coag's  Recent Labs Lab 05/22/17 1553  05/24/17 0707 05/24/17 1918 05/25/17 0251  APTT  --   < > 115* 58* 62*  INR 2.06  --   --   --   --   < > = values in this interval not displayed.  Sepsis Markers  Recent Labs Lab 05/22/17 1559  LATICACIDVEN 3.17*    ABG  Recent Labs Lab 05/22/17 1655 05/23/17 0342  PHART 7.395 7.507*  PCO2ART 38.0 28.7*  PO2ART 201.0* 98.8  Liver Enzymes  Recent Labs Lab 05/22/17 1553  AST 62*  ALT 57  ALKPHOS 70  BILITOT 0.9  ALBUMIN 3.4*    Cardiac Enzymes  Recent Labs Lab 05/22/17 1928 05/23/17 0108  TROPONINI 0.20* 0.44*    Glucose  Recent Labs Lab 05/24/17 1218 05/24/17 1706 05/24/17 2133 05/25/17 0024 05/25/17 0429 05/25/17 0745  GLUCAP 258* 141* 132* 132* 126* 196*    Imaging Dg Chest Port 1 View  Result Date: 05/25/2017 CLINICAL DATA:  Follow-up examination for acute respiratory failure. EXAM: PORTABLE CHEST 1 VIEW COMPARISON:  Prior radiograph from 05/24/2017. FINDINGS: Stable cardiomegaly. Left-sided pacemaker/AICD again noted. Mediastinal silhouette unchanged. Lungs hypoinflated. Persistent left basilar opacity, either atelectasis or consolidation. Finding slightly improved. No other focal  airspace disease. No pulmonary edema. Small residual left pleural effusion. No pneumothorax. No acute osseus abnormality. IMPRESSION: 1. Persistent but improved left basilar opacity, which may reflect improved atelectasis or infiltrate. Small residual left pleural effusion. 2. Otherwise stable appearance of the chest. Electronically Signed   By: Jeannine Boga M.D.   On: 05/25/2017 06:53    STUDIES:  CT head 6/8 > no acute process. CXR 6/8 > mild interstitial edema. Echo 6/9 > LVEF 25-30%, Diffuse HK, inferior wall AK. Mild AS, patent PFO.  CULTURES: Blood 6/8 >  Sputum 6/8 >  Urine 6/8 >   ANTIBIOTICS: None.  SIGNIFICANT EVENTS: 6/8 > admitted after cardiac arrest.  LINES/TUBES: ETT 6/8 >   DISCUSSION: 76 y.o. male admitted 6/8 after VT cardiac arrest. Had delayed response of ICD firing (felt to be due to VT at slower rate than ICD was set to fire at). Required intubation in ED due to inability to protect airway. Cooling deferred given his ability to follow basic commands. He was extubated 6/9 and has continued to improve. He is for ICD lead replacement 6/12.  ASSESSMENT / PLAN:  VT arrest - with downtime a roughly 1 minute before ICD fired. Chronic systolic CHF due to ICM CAD -Cardiology following. -Will need ICD lead replacement 6/12 - Heparin drip - Continue amiodarone. - Digoxin and pravachol restarted - Brilinta  Mild interstitial edema. Hx OSA. - Nocturnal O2 2L - Not on  CPAP  Acute kidney injury: improving - Montior urine output and Cr  Nutrition. - heart healthy carb modified diet - Protonix for SUP  VTE Prophylaxis. -SCDs On full dose anticoagulation with heparin gtt  DM - SSI. - Glimepride  Resolved issues: - Respiratory insufficiency - Acute encephalopathy    Will plan to transfer to telemetry unit today. TRH vs cardiology to assume care 6/12  Georgann Housekeeper, [facility] [place] [facility] Pulmonology/Critical Care Pager [phone] or [phone]  05/25/2017 8:15 AM  Attending Note:  76 year old male s/p 1 minute cardiac arrest that was intubated and not cooled.  Extubated 6/9 and has done well.  On exam, lungs are clear.  I reviewed CXR myself, no acute disease noted.  Discussed with PCCM-NP and advanced heart failure team.  Cardiac arrest:  - ICD to be changed in AM  Hypoxemia:  - Titrate O2 for sat of 88-92%.  Pulmonary edema:  - Diureses as ordered  DM:  - CBGs  - ISS  Will transfer to tele and TRH or advanced heart failure team service with PCCM off 6/12.  Patient seen and examined, agree with above note.  I dictated the care and orders written for this patient under my direction.  Rush Farmer, [place]

## 2017-05-25 NOTE — Progress Notes (Signed)
Progress Note  Patient Name: ZAIAH ECKERSON Date of Encounter: 05/25/2017  Primary Cardiologist: Aundra Dubin  Subjective   No chest pain or sob. Difficulty sleeping  Inpatient Medications    Scheduled Meds: . digoxin  0.0625 mg Oral Daily  . glimepiride  2 mg Oral Q breakfast  . insulin aspart  0-15 Units Subcutaneous TID WC  . insulin aspart  0-5 Units Subcutaneous QHS  . pantoprazole  40 mg Oral Daily  . pravastatin  20 mg Oral q1800  . ticagrelor  60 mg Oral BID   Continuous Infusions: . sodium chloride    . heparin     PRN Meds: sodium chloride, fentaNYL (SUBLIMAZE) injection   Vital Signs    Vitals:   05/25/17 0400 05/25/17 0500 05/25/17 0600 05/25/17 0700  BP: (!) 121/57 (!) 122/48 128/60 (!) 123/57  Pulse:      Resp: 16 18 10 17   Temp:    98.3 F (36.8 C)  TempSrc:    Oral  SpO2: 96% 94% 97% 96%  Weight:      Height:        Intake/Output Summary (Last 24 hours) at 05/25/17 0747 Last data filed at 05/25/17 0700  Gross per 24 hour  Intake           757.05 ml  Output             1360 ml  Net          -602.95 ml   Filed Weights   05/23/17 0500 05/24/17 0432 05/25/17 0300  Weight: 229 lb 15 oz (104.3 kg) 229 lb 4.5 oz (104 kg) 227 lb 12.8 oz (103.3 kg)    Telemetry    nsr with occaisional PVC's - Personally Reviewed  ECG    none - Personally Reviewed  Physical Exam   GEN: No acute distress.   Neck: 6 cm JVD Cardiac: RRR, no murmurs, rubs, or gallops.  Respiratory: Clear to auscultation bilaterally. GI: Soft, nontender, non-distended  MS: No edema; No deformity. Neuro:  Nonfocal  Psych: Normal affect   Labs    Chemistry Recent Labs Lab 05/22/17 1553 05/23/17 0109 05/24/17 0236 05/25/17 0251  NA 138 141 138 138  K 4.7 4.9 3.8 4.5  CL 106 105 107 107  CO2 24 25 23 25   GLUCOSE 300* 255* 133* 152*  BUN 34* 29* 22* 16  CREATININE 1.77* 1.57* 1.50* 1.27*  CALCIUM 8.9 9.2 8.4* 8.7*  PROT 6.7  --   --   --   ALBUMIN 3.4*  --   --   --    AST 62*  --   --   --   ALT 57  --   --   --   ALKPHOS 70  --   --   --   BILITOT 0.9  --   --   --   GFRNONAA 36* 42* 44* 54*  GFRAA 42* 49* 52* >60  ANIONGAP 8 11 8 6      Hematology Recent Labs Lab 05/23/17 0109 05/24/17 0236 05/25/17 0251  WBC 10.0 8.7 7.2  RBC 4.42 3.85* 3.89*  HGB 12.7* 11.1* 11.2*  HCT 39.9 35.2* 35.6*  MCV 90.3 91.4 91.5  MCH 28.7 28.8 28.8  MCHC 31.8 31.5 31.5  RDW 13.7 13.9 13.6  PLT 170 162 146*    Cardiac Enzymes Recent Labs Lab 05/22/17 1928 05/23/17 0108  TROPONINI 0.20* 0.44*    Recent Labs Lab 05/22/17 1557  TROPIPOC 0.04  BNPNo results for input(s): BNP, PROBNP in the last 168 hours.   DDimer No results for input(s): DDIMER in the last 168 hours.   Radiology    Dg Chest Port 1 View  Result Date: 05/25/2017 CLINICAL DATA:  Follow-up examination for acute respiratory failure. EXAM: PORTABLE CHEST 1 VIEW COMPARISON:  Prior radiograph from 05/24/2017. FINDINGS: Stable cardiomegaly. Left-sided pacemaker/AICD again noted. Mediastinal silhouette unchanged. Lungs hypoinflated. Persistent left basilar opacity, either atelectasis or consolidation. Finding slightly improved. No other focal airspace disease. No pulmonary edema. Small residual left pleural effusion. No pneumothorax. No acute osseus abnormality. IMPRESSION: 1. Persistent but improved left basilar opacity, which may reflect improved atelectasis or infiltrate. Small residual left pleural effusion. 2. Otherwise stable appearance of the chest. Electronically Signed   By: Jeannine Boga M.D.   On: 05/25/2017 06:53   Dg Chest Port 1 View  Result Date: 05/24/2017 CLINICAL DATA:  Respiratory failure EXAM: PORTABLE CHEST 1 VIEW COMPARISON:  May 23, 2017 FINDINGS: Endotracheal tube and nasogastric tube have been removed. No pneumothorax. Pacemaker present with lead attached to right ventricle. There is persistent atelectasis/ consolidation in the left lower lobe with small left  pleural effusion. Right lung is clear. There is stable cardiomegaly with pulmonary vascularity within normal limits. There is aortic atherosclerosis. No adenopathy. No bone lesions. IMPRESSION: Persistent left lower lobe atelectasis/ consolidation with small left pleural effusion. A degree of pneumonia in the left base cannot be excluded. No new opacity evident. Stable cardiac prominence. There is aortic atherosclerosis. Electronically Signed   By: Lowella Grip III M.D.   On: 05/24/2017 07:37    Cardiac Studies   None new  Patient Profile     76 y.o. male admitted with prolonged VF with ultimately successful ICD shock. He has been extubated and stable.  Assessment & Plan    1. VF cardiac arrest - s/p ICD therapy, although delayed due to undersensing on the ventricular channel. He will undergo ICD lead extraction and insertion of a new ICD lead tomorrow. 2. ICD lead undersensing - as above. Will place a new lead tomorrow. His device has been reprogrammed but R waves are 1-2.5. 3. Chronic systolic heart failure - he appears euvolemic at the present time. He does have a h/o non-compliance. 4. Ischemic CM - he denies anginal symptoms. He had a heart cath 3 months ago.   Signed, [name], MD  05/25/2017, 7:47 AM  Patient ID: Kelton Pillar, male   DOB: 1941/07/06, 76 y.o.   MRN: [id number]

## 2017-05-25 NOTE — Progress Notes (Signed)
Patient ID: Wayne [name], male   DOB: [birth year], 76 y.o.   MRN: [id number]

## 2017-05-26 ENCOUNTER — Encounter (HOSPITAL_COMMUNITY)
Admission: EM | Disposition: A | Discharge status: Home / Self Care | Payer: Self-pay | Source: Home / Self Care | Attending: Emergency Medicine

## 2017-05-26 DIAGNOSIS — T82120A Displacement of cardiac electrode, initial encounter: Secondary | ICD-10-CM

## 2017-05-26 HISTORY — PX: LEAD REVISION/REPAIR: EP1213

## 2017-05-26 LAB — GLUCOSE, CAPILLARY
GLUCOSE-CAPILLARY: 148 mg/dL — AB (ref 65–99)
GLUCOSE-CAPILLARY: 177 mg/dL — AB (ref 65–99)
Glucose-Capillary: 189 mg/dL — ABNORMAL HIGH (ref 65–99)
Glucose-Capillary: 176 mg/dL — ABNORMAL HIGH (ref 65–99)

## 2017-05-26 LAB — BASIC METABOLIC PANEL
ANION GAP: 10 (ref 5–15)
BUN: 15 mg/dL (ref 6–20)
CHLORIDE: 107 mmol/L (ref 101–111)
CO2: 21 mmol/L — AB (ref 22–32)
Calcium: 8.6 mg/dL — ABNORMAL LOW (ref 8.9–10.3)
Creatinine, Ser: 1.15 mg/dL (ref 0.61–1.24)
GFR calc Af Amer: >60 mL/min (ref >60)
GFR calc non Af Amer: >60 mL/min (ref >60)
GLUCOSE: 158 mg/dL — AB (ref 65–99)
POTASSIUM: 4.1 mmol/L (ref 3.5–5.1)
Sodium: 138 mmol/L (ref 135–145)

## 2017-05-26 LAB — CBC
HEMATOCRIT: 36.9 % — AB (ref 39.0–52.0)
HEMOGLOBIN: 11.4 g/dL — AB (ref 13.0–17.0)
MCH: 28.6 pg (ref 26.0–34.0)
MCHC: 30.9 g/dL (ref 30.0–36.0)
MCV: 92.7 fL (ref 78.0–100.0)
Platelets: 160 10*3/uL (ref 150–400)
RBC: 3.98 MIL/uL — ABNORMAL LOW (ref 4.22–5.81)
RDW: 13.6 % (ref 11.5–15.5)
WBC: 5.4 10*3/uL (ref 4.0–10.5)

## 2017-05-26 LAB — HEPARIN LEVEL (UNFRACTIONATED): Heparin Unfractionated: <0.1 IU/mL — ABNORMAL LOW (ref 0.30–0.70)

## 2017-05-26 LAB — SURGICAL PCR SCREEN
MRSA, PCR: NEGATIVE
STAPHYLOCOCCUS AUREUS: POSITIVE — AB

## 2017-05-26 SURGERY — LEAD REVISION/REPAIR
Anesthesia: LOCAL

## 2017-05-26 MED ORDER — MIDAZOLAM HCL 5 MG/5ML IJ SOLN
INTRAMUSCULAR | Status: DC | PRN
  Administered 2017-05-26 (×3): 2 mg via INTRAVENOUS

## 2017-05-26 MED ORDER — MIDAZOLAM HCL 5 MG/5ML IJ SOLN
INTRAMUSCULAR | Status: AC
  Filled 2017-05-26: qty 5

## 2017-05-26 MED ORDER — LIDOCAINE HCL (PF) 1 % IJ SOLN
INTRAMUSCULAR | Status: AC
  Filled 2017-05-26: qty 60

## 2017-05-26 MED ORDER — CHLORHEXIDINE GLUCONATE CLOTH 2 % EX PADS
6.0000 | MEDICATED_PAD | Freq: Every day | CUTANEOUS | Status: DC
  Administered 2017-05-27 (×2): 6 via TOPICAL

## 2017-05-26 MED ORDER — MUPIROCIN 2 % EX OINT
1.0000 "application " | TOPICAL_OINTMENT | Freq: Two times a day (BID) | CUTANEOUS | Status: DC
  Administered 2017-05-26 – 2017-05-27 (×2): 1 via NASAL
  Filled 2017-05-26: qty 22

## 2017-05-26 MED ORDER — LIDOCAINE HCL (PF) 1 % IJ SOLN
INTRAMUSCULAR | Status: DC | PRN
  Administered 2017-05-26: 40 mL via INTRADERMAL

## 2017-05-26 MED ORDER — ONDANSETRON HCL 4 MG/2ML IJ SOLN: 4.0000 mg | Freq: Four times a day (QID) | INTRAMUSCULAR | Status: DC | PRN

## 2017-05-26 MED ORDER — FENTANYL CITRATE (PF) 100 MCG/2ML IJ SOLN
INTRAMUSCULAR | Status: DC | PRN
  Administered 2017-05-26: 25 ug via INTRAVENOUS

## 2017-05-26 MED ORDER — ACETAMINOPHEN 325 MG PO TABS: 325.0000 mg | ORAL_TABLET | ORAL | Status: DC | PRN

## 2017-05-26 MED ORDER — VANCOMYCIN HCL IN DEXTROSE 1-5 GM/200ML-% IV SOLN
1000.0000 mg | Freq: Two times a day (BID) | INTRAVENOUS | Status: AC
  Administered 2017-05-26: 1000 mg via INTRAVENOUS
  Filled 2017-05-26: qty 200

## 2017-05-26 MED ORDER — FENTANYL CITRATE (PF) 100 MCG/2ML IJ SOLN
INTRAMUSCULAR | Status: AC
  Filled 2017-05-26: qty 2

## 2017-05-26 MED ORDER — SODIUM CHLORIDE 0.9 % IR SOLN
Status: AC
  Filled 2017-05-26: qty 2

## 2017-05-26 MED ORDER — HEPARIN (PORCINE) IN NACL 2-0.9 UNIT/ML-% IJ SOLN
INTRAMUSCULAR | Status: AC
  Filled 2017-05-26: qty 500

## 2017-05-26 MED ORDER — HEPARIN (PORCINE) IN NACL 2-0.9 UNIT/ML-% IJ SOLN
INTRAMUSCULAR | Status: AC | PRN
  Administered 2017-05-26: 500 mL

## 2017-05-26 SURGICAL SUPPLY — 5 items
CABLE SURGICAL S-101-97-12 (CABLE) ×1 IMPLANT
LEAD SPRINT QUAT SEC 6935-65CM (Lead) ×1 IMPLANT
PAD DEFIB LIFELINK (PAD) ×1 IMPLANT
SHEATH CLASSIC 9F (SHEATH) ×1 IMPLANT
TRAY PACEMAKER INSERTION (PACKS) ×1 IMPLANT

## 2017-05-26 NOTE — Progress Notes (Signed)
Patient ID: Wayne [name], male   DOB: 16-Apr-1941, 76 y.o.   MRN: [id number]    SUBJECTIVE:  Admitted  05/22/17 with VF arrest. ICD delayed sensing.    EP plans for lead extraction and insertion of new ICD lead today.   He feels well, denies chest pain, SOB and palpitations.   Scheduled Meds: . amiodarone  200 mg Oral BID  . digoxin  0.0625 mg Oral Daily  . gentamicin irrigation  80 mg Irrigation To Cath  . glimepiride  2 mg Oral Q breakfast  . insulin aspart  0-15 Units Subcutaneous TID WC  . insulin aspart  0-5 Units Subcutaneous QHS  . losartan  12.5 mg Oral Daily  . pantoprazole  40 mg Oral Daily  . pravastatin  20 mg Oral q1800  . spironolactone  12.5 mg Oral Daily   Continuous Infusions: . sodium chloride    . sodium chloride 50 mL/hr at 05/26/17 0606  . vancomycin     PRN Meds:.sodium chloride, acetaminophen, morphine injection    Vitals:   05/25/17 1300 05/25/17 2229 05/26/17 0206 05/26/17 0453  BP: (!) 122/56 126/67 130/68 (!) 114/52  Pulse: 70 70 71 71  Resp: 18 18 18 18   Temp:  98.7 F (37.1 C) 98.3 F (36.8 C) 98.4 F (36.9 C)  TempSrc:  Oral Oral Oral  SpO2:  96% 96% 98%  Weight:    229 lb 8 oz (104.1 kg)  Height:        Intake/Output Summary (Last 24 hours) at 05/26/17 0738 Last data filed at 05/26/17 0705  Gross per 24 hour  Intake           1244.7 ml  Output             1800 ml  Net           -555.3 ml    LABS: Basic Metabolic Panel:  Recent Labs  05/24/17 0236 05/25/17 0251 05/26/17 0512  NA 138 138 138  K 3.8 4.5 4.1  CL 107 107 107  CO2 23 25 21*  GLUCOSE 133* 152* 158*  BUN 22* 16 15  CREATININE 1.50* 1.27* 1.15  CALCIUM 8.4* 8.7* 8.6*  MG 2.1 2.2  --   PHOS 3.1 2.4*  --    Liver Function Tests: No results for input(s): AST, ALT, ALKPHOS, BILITOT, PROT, ALBUMIN in the last 72 hours. No results for input(s): LIPASE, AMYLASE in the last 72 hours. CBC:  Recent Labs  05/25/17 0251 05/26/17 0512  WBC 7.2 5.4  HGB 11.2*  11.4*  HCT 35.6* 36.9*  MCV 91.5 92.7  PLT 146* 160   Cardiac Enzymes: No results for input(s): CKTOTAL, CKMB, CKMBINDEX, TROPONINI in the last 72 hours. BNP: Invalid input(s): POCBNP D-Dimer: No results for input(s): DDIMER in the last 72 hours. Hemoglobin A1C: No results for input(s): HGBA1C in the last 72 hours. Fasting Lipid Panel: No results for input(s): CHOL, HDL, LDLCALC, TRIG, CHOLHDL, LDLDIRECT in the last 72 hours. Thyroid Function Tests: No results for input(s): TSH, T4TOTAL, T3FREE, THYROIDAB in the last 72 hours.  Invalid input(s): FREET3 Anemia Panel: No results for input(s): VITAMINB12, FOLATE, FERRITIN, TIBC, IRON, RETICCTPCT in the last 72 hours.  RADIOLOGY: Ct Head Wo Contrast  Result Date: 05/22/2017 CLINICAL DATA:  Fall. EXAM: CT HEAD WITHOUT CONTRAST TECHNIQUE: Contiguous axial images were obtained from the base of the skull through the vertex without intravenous contrast. COMPARISON:  02/11/2017 head CT. FINDINGS: Brain: No evidence of parenchymal hemorrhage  or extra-axial fluid collection. No mass lesion, mass effect, or midline shift. No CT evidence of acute infarction. Cerebral volume is age appropriate. No ventriculomegaly. Vascular: No hyperdense vessel or unexpected calcification. Skull: No evidence of calvarial fracture. Sinuses/Orbits: No fluid levels. Mucoperiosteal thickening in the bilateral ethmoidal air cells and sphenoid sinus. Other: Small right parietal scalp contusion. The mastoid air cells are unopacified. IMPRESSION: 1. Small right parietal scalp contusion. 2. No evidence of acute intracranial abnormality. No evidence of calvarial fracture. 3. Mild chronic appearing paranasal sinusitis. Electronically Signed   By: Ilona Sorrel M.D.   On: 05/22/2017 17:00   Dg Chest Port 1 View  Result Date: 05/25/2017 CLINICAL DATA:  Follow-up examination for acute respiratory failure. EXAM: PORTABLE CHEST 1 VIEW COMPARISON:  Prior radiograph from 05/24/2017.  FINDINGS: Stable cardiomegaly. Left-sided pacemaker/AICD again noted. Mediastinal silhouette unchanged. Lungs hypoinflated. Persistent left basilar opacity, either atelectasis or consolidation. Finding slightly improved. No other focal airspace disease. No pulmonary edema. Small residual left pleural effusion. No pneumothorax. No acute osseus abnormality. IMPRESSION: 1. Persistent but improved left basilar opacity, which may reflect improved atelectasis or infiltrate. Small residual left pleural effusion. 2. Otherwise stable appearance of the chest. Electronically Signed   By: Jeannine Boga M.D.   On: 05/25/2017 06:53   Dg Chest Port 1 View  Result Date: 05/24/2017 CLINICAL DATA:  Respiratory failure EXAM: PORTABLE CHEST 1 VIEW COMPARISON:  May 23, 2017 FINDINGS: Endotracheal tube and nasogastric tube have been removed. No pneumothorax. Pacemaker present with lead attached to right ventricle. There is persistent atelectasis/ consolidation in the left lower lobe with small left pleural effusion. Right lung is clear. There is stable cardiomegaly with pulmonary vascularity within normal limits. There is aortic atherosclerosis. No adenopathy. No bone lesions. IMPRESSION: Persistent left lower lobe atelectasis/ consolidation with small left pleural effusion. A degree of pneumonia in the left base cannot be excluded. No new opacity evident. Stable cardiac prominence. There is aortic atherosclerosis. Electronically Signed   By: Lowella Grip III M.D.   On: 05/24/2017 07:37   Dg Chest Port 1 View  Result Date: 05/23/2017 CLINICAL DATA:  Hypoxia EXAM: PORTABLE CHEST 1 VIEW COMPARISON:  May 22, 2017 FINDINGS: Endotracheal tube tip is 3.9 cm above the carina. Nasogastric tube tip and side port are below the diaphragm. Pacemaker lead is attached to the right ventricle. No pneumothorax. There is a small left pleural effusion with left base atelectasis. Lungs elsewhere clear. Heart is mildly enlarged. The  pulmonary vascular is normal. No adenopathy. There is aortic atherosclerosis. No bone lesions evident. IMPRESSION: Tube positions as described without pneumothorax. Stable cardiomegaly with aortic atherosclerosis. Small left pleural effusion with left base atelectasis. No frank consolidation or edema appreciable. Electronically Signed   By: Lowella Grip III M.D.   On: 05/23/2017 07:27   Dg Chest Portable 1 View  Result Date: 05/22/2017 CLINICAL DATA:  Intubation.  Trauma. EXAM: PORTABLE CHEST 1 VIEW COMPARISON:  02/23/2017. FINDINGS: Endotracheal tube noted with its tip at the carina. Proximal repositioning of approximately 3 cm suggested. AICD noted in stable position. Stable cardiomegaly. Mild pulmonary vascular congestion. Mild interstitial prominence. Mild component of CHF cannot be excluded. Small left pleural effusion. Low lung volumes. IMPRESSION: 1. Endotracheal tube noted with its tip at the carina pre and for repositioning of approximately 3 cm should be considered. 2. AICD in stable position. Mild changes of congestive heart failure with mild interstitial edema small left pleural effusion. Low lung volumes. Critical Value/emergent results were called  by telephone at the time of interpretation on 05/22/2017 at 3:56 pm to nurse Claiborne Billings, who verbally acknowledged these results.a Electronically Signed   By: Marcello Moores  Register   On: 05/22/2017 15:58    PHYSICAL EXAM General:  Well appearing male, NAD. Lying in bed.  HEENT: normal Neck: Supple. No JVD.  Carotids 2+ bilat; no bruits. No lymphadenopathy or thryomegaly appreciated. Cor: PMI nondisplaced. Regular rate and rhythm. No rubs, gallops or murmurs. Lungs: Clear bilaterally, normal effort.  Abdomen: Soft, protuberant,  nontender, nondistended. No hepatosplenomegaly. No bruits or masses. + bowel sounds.  Extremities: no cyanosis, clubbing, rash, warm. No edema.  Neuro: Alert and oriented x 3., cranial nerves grossly intact. moves all 4  extremities w/o difficulty. Affect pleasant  TELEMETRY:  NSR   ASSESSMENT AND PLAN: 1. Ventricular fibrillation arrest: This is his 2nd episode.  Has Medtronic ICD, there was prolongation of time to discharge due to undersensing.   - Lead replacement today per EP.  - Continue Amio 200mg  BID.  2. Acute respiratory failure: Intubated for airways control with altered mental status.  - No extubed - Stable, on room air.  3. Chronic systolic CHF: Ischemic cardiomyopathy.  3/18 echo with EF 20-25%, diffuse hypokinesis.  CPX in 10/17 with severe HF limitation.  RHC in 12/17 with elevated filling pressures and low cardiac output.  He is now s/p PCI to proximal to mid LAD with much improved symptoms at home prior to this event.  Medtronic ICD in 3/18 after vfib arrest.   -Continue digoxin  -Continue losartan 12.5 mg daily.  -Continue Spiro 12.5 mg daily.      4. CAD:  Now s/p PCI to proximal LAD.  He had an occluded RCA but there were left to right collaterals. No CP . Continue statin. Should continue Repatha at home.    - Anticoagulation for atrial fibrillation with antiplatelet agents is complicated with Mr Geissinger. He is allergic to Plavix, hadspontaneous gastrocnemius hemorrhage with prasugrel.   He has been on ticagrelor 60 bid + Xarelto 15 based on [facility] data.  He has now completed 6 months of ticagrelor post-PCI.  - Stop Brilinta at discharge and increase Xarelto to 20mg  daily.  5. Atrial fibrillation: Maintaining NSR.  Continue combination of ticagrelor 60 mg bid and Xarelto 15 daily for now.  - As above at discharge stop Brilinta and increase Xarelto to 20mg  daily.  6. OSA:  - Encouraged him to use CPAP.   Arbutus Leas NP-C  05/26/2017 7:38 AM   Patient seen and examined with Jettie Booze, NP. We discussed all aspects of the encounter. I agree with the assessment and plan as stated above.   Stable from HF perspective. Weight stable. Respiratory status stable.   No signs/sx ischemia.    For ICD lead replacement today. Appreciate EP's care.   Glori Bickers, MD  8:13 AM

## 2017-05-26 NOTE — Discharge Instructions (Signed)
° ° °  Supplemental Discharge Instructions for  Pacemaker/Defibrillator Patients  Activity No heavy lifting or vigorous activity with your left/right arm for 6 to 8 weeks.  Do not raise your left/right arm above your head for one week.  Gradually raise your affected arm as drawn below.              05/30/17                    05/31/17                    06/01/17                  06/02/17 __  NO DRIVING for 6 months  WOUND CARE - Keep the wound area clean and dry.  Do not get this area wet for one week. No showers for one week; you may shower on  06/02/17   . - The tape/steri-strips on your wound will fall off; do not pull them off.  No bandage is needed on the site.  DO  NOT apply any creams, oils, or ointments to the wound area. - If you notice any drainage or discharge from the wound, any swelling or bruising at the site, or you develop a fever > 101? F after you are discharged home, call the office at once.  Special Instructions - You are still able to use cellular telephones; use the ear opposite the side where you have your pacemaker/defibrillator.  Avoid carrying your cellular phone near your device. - When traveling through airports, show security personnel your identification card to avoid being screened in the metal detectors.  Ask the security personnel to use the hand wand. - Avoid arc welding equipment, MRI testing (magnetic resonance imaging), TENS units (transcutaneous nerve stimulators).  Call the office for questions about other devices. - Avoid electrical appliances that are in poor condition or are not properly grounded. - Microwave ovens are safe to be near or to operate.  Additional information for defibrillator patients should your device go off: - If your device goes off ONCE and you feel fine afterward, notify the device clinic nurses. - If your device goes off ONCE and you do not feel well afterward, call 911. - If your device goes off TWICE, call 911. - If your device  goes off THREE times in one day, call 911.  DO NOT DRIVE YOURSELF OR A FAMILY MEMBER WITH A DEFIBRILLATOR TO THE HOSPITAL--CALL 911.'

## 2017-05-26 NOTE — H&P (View-Only) (Signed)
SUBJECTIVE: The patient is doing well today.  At this time, he denies chest pain, shortness of breath, or any new concerns.  Marland Kitchen amiodarone  200 mg Oral BID  . digoxin  0.0625 mg Oral Daily  . gentamicin irrigation  80 mg Irrigation To Cath  . glimepiride  2 mg Oral Q breakfast  . insulin aspart  0-15 Units Subcutaneous TID WC  . insulin aspart  0-5 Units Subcutaneous QHS  . losartan  12.5 mg Oral Daily  . pantoprazole  40 mg Oral Daily  . pravastatin  20 mg Oral q1800  . spironolactone  12.5 mg Oral Daily   . sodium chloride    . sodium chloride 50 mL/hr at 05/26/17 0606  . vancomycin      OBJECTIVE: Physical Exam: Vitals:   05/25/17 2229 05/26/17 0206 05/26/17 0453 05/26/17 0753  BP: 126/67 130/68 (!) 114/52 (!) 124/54  Pulse: 70 71 71 68  Resp: 18 18 18 18   Temp: 98.7 F (37.1 C) 98.3 F (36.8 C) 98.4 F (36.9 C) 98.3 F (36.8 C)  TempSrc: Oral Oral Oral Oral  SpO2: 96% 96% 98% 97%  Weight:   229 lb 8 oz (104.1 kg)   Height:        Intake/Output Summary (Last 24 hours) at 05/26/17 8921 Last data filed at 05/26/17 1941  Gross per 24 hour  Intake            745.2 ml  Output             1400 ml  Net           -654.8 ml    Telemetry is reviewed by myself: SR  GEN- The patient is well appearing, alert and oriented x 3 today.   Head- normocephalic, atraumatic Eyes-  Sclera clear, conjunctiva pink Ears- hearing intact Oropharynx- clear Neck- supple, no JVP Lungs- CTA b/l, normal work of breathing Heart- RRR, 1/6 SM, no rubs or gallops GI- soft, NT, ND Extremities- no clubbing, cyanosis, or edema Skin- no rash or lesion Psych- euthymic mood, full affect Neuro- no gross deficits appreciated   LABS: Basic Metabolic Panel:  Recent Labs  05/24/17 0236 05/25/17 0251 05/26/17 0512  NA 138 138 138  K 3.8 4.5 4.1  CL 107 107 107  CO2 23 25 21*  GLUCOSE 133* 152* 158*  BUN 22* 16 15  CREATININE 1.50* 1.27* 1.15  CALCIUM 8.4* 8.7* 8.6*  MG 2.1 2.2   --   PHOS 3.1 2.4*  --    CBC:  Recent Labs  05/25/17 0251 05/26/17 0512  WBC 7.2 5.4  HGB 11.2* 11.4*  HCT 35.6* 36.9*  MCV 91.5 92.7  PLT 146* 160    RADIOLOGY: Dg Chest Port 1 Vie Result Date: 05/25/2017 CLINICAL DATA:  Follow-up examination for acute respiratory failure. EXAM: PORTABLE CHEST 1 VIEW COMPARISON:  Prior radiograph from 05/24/2017. FINDINGS: Stable cardiomegaly. Left-sided pacemaker/AICD again noted. Mediastinal silhouette unchanged. Lungs hypoinflated. Persistent left basilar opacity, either atelectasis or consolidation. Finding slightly improved. No other focal airspace disease. No pulmonary edema. Small residual left pleural effusion. No pneumothorax. No acute osseus abnormality. IMPRESSION: 1. Persistent but improved left basilar opacity, which may reflect improved atelectasis or infiltrate. Small residual left pleural effusion. 2. Otherwise stable appearance of the chest. Electronically Signed   By: Jeannine Boga M.D.   On: 05/25/2017 06:53     ASSESSMENT AND PLAN:   1. VF cardiac arrest - s/p ICD therapy, although delayed  due to undersensing on the ventricular channel.      He is scheduled today for RV lead extraction and new RV lead implant      Held Brilinta and heparin this morning      On amiodarone 200mg  BID  2. Chronic systolic heart failure       he appears euvolemic at the present time.        He does have a h/o non-compliance.      CHF team on board  4. Ischemic CM, CAD     he denies anginal symptoms. He had a heart cath/PCI 3 months ago.     Plans to stop his Brilinta now 6 months post intervention noted by cards note  5. AFib     Out patient on Xarelto 15 and his Brlinta     Plans going forward would be Xarelto alone (20mg ) by cardiology note       Tommye Standard, PA-C 05/26/2017 9:28 AM   EP Attending  Patient seen and examined. Agree with above. I have discussed the indications/risk/benefit/goals/expectations of ICD lead revision  with the patient and he wishes to proceed.  Mikle Bosworth.D.

## 2017-05-26 NOTE — Progress Notes (Signed)
SUBJECTIVE: The patient is doing well today.  At this time, he denies chest pain, shortness of breath, or any new concerns.  Marland Kitchen amiodarone  200 mg Oral BID  . digoxin  0.0625 mg Oral Daily  . gentamicin irrigation  80 mg Irrigation To Cath  . glimepiride  2 mg Oral Q breakfast  . insulin aspart  0-15 Units Subcutaneous TID WC  . insulin aspart  0-5 Units Subcutaneous QHS  . losartan  12.5 mg Oral Daily  . pantoprazole  40 mg Oral Daily  . pravastatin  20 mg Oral q1800  . spironolactone  12.5 mg Oral Daily   . sodium chloride    . sodium chloride 50 mL/hr at 05/26/17 0606  . vancomycin      OBJECTIVE: Physical Exam: Vitals:   05/25/17 2229 05/26/17 0206 05/26/17 0453 05/26/17 0753  BP: 126/67 130/68 (!) 114/52 (!) 124/54  Pulse: 70 71 71 68  Resp: 18 18 18 18   Temp: 98.7 F (37.1 C) 98.3 F (36.8 C) 98.4 F (36.9 C) 98.3 F (36.8 C)  TempSrc: Oral Oral Oral Oral  SpO2: 96% 96% 98% 97%  Weight:   229 lb 8 oz (104.1 kg)   Height:        Intake/Output Summary (Last 24 hours) at 05/26/17 2683 Last data filed at 05/26/17 4196  Gross per 24 hour  Intake            745.2 ml  Output             1400 ml  Net           -654.8 ml    Telemetry is reviewed by myself: SR  GEN- The patient is well appearing, alert and oriented x 3 today.   Head- normocephalic, atraumatic Eyes-  Sclera clear, conjunctiva pink Ears- hearing intact Oropharynx- clear Neck- supple, no JVP Lungs- CTA b/l, normal work of breathing Heart- RRR, 1/6 SM, no rubs or gallops GI- soft, NT, ND Extremities- no clubbing, cyanosis, or edema Skin- no rash or lesion Psych- euthymic mood, full affect Neuro- no gross deficits appreciated   LABS: Basic Metabolic Panel:  Recent Labs  05/24/17 0236 05/25/17 0251 05/26/17 0512  NA 138 138 138  K 3.8 4.5 4.1  CL 107 107 107  CO2 23 25 21*  GLUCOSE 133* 152* 158*  BUN 22* 16 15  CREATININE 1.50* 1.27* 1.15  CALCIUM 8.4* 8.7* 8.6*  MG 2.1 2.2   --   PHOS 3.1 2.4*  --    CBC:  Recent Labs  05/25/17 0251 05/26/17 0512  WBC 7.2 5.4  HGB 11.2* 11.4*  HCT 35.6* 36.9*  MCV 91.5 92.7  PLT 146* 160    RADIOLOGY: Dg Chest Port 1 Vie Result Date: 05/25/2017 CLINICAL DATA:  Follow-up examination for acute respiratory failure. EXAM: PORTABLE CHEST 1 VIEW COMPARISON:  Prior radiograph from 05/24/2017. FINDINGS: Stable cardiomegaly. Left-sided pacemaker/AICD again noted. Mediastinal silhouette unchanged. Lungs hypoinflated. Persistent left basilar opacity, either atelectasis or consolidation. Finding slightly improved. No other focal airspace disease. No pulmonary edema. Small residual left pleural effusion. No pneumothorax. No acute osseus abnormality. IMPRESSION: 1. Persistent but improved left basilar opacity, which may reflect improved atelectasis or infiltrate. Small residual left pleural effusion. 2. Otherwise stable appearance of the chest. Electronically Signed   By: Jeannine Boga M.D.   On: 05/25/2017 06:53     ASSESSMENT AND PLAN:   1. VF cardiac arrest - s/p ICD therapy, although delayed  due to undersensing on the ventricular channel.      He is scheduled today for RV lead extraction and new RV lead implant      Held Brilinta and heparin this morning      On amiodarone 200mg  BID  2. Chronic systolic heart failure       he appears euvolemic at the present time.        He does have a h/o non-compliance.      CHF team on board  4. Ischemic CM, CAD     he denies anginal symptoms. He had a heart cath/PCI 3 months ago.     Plans to stop his Brilinta now 6 months post intervention noted by cards note  5. AFib     Out patient on Xarelto 15 and his Brlinta     Plans going forward would be Xarelto alone (20mg ) by cardiology note       Tommye Standard, PA-C 05/26/2017 9:28 AM   EP Attending  Patient seen and examined. Agree with above. I have discussed the indications/risk/benefit/goals/expectations of ICD lead revision  with the patient and he wishes to proceed.  Mikle Bosworth.D.

## 2017-05-26 NOTE — Progress Notes (Signed)
ANTICOAGULATION CONSULT NOTE - Follow Up Consult  Pharmacy Consult for Heparin (Xarelto on hold) Indication: atrial fibrillation  Allergies  Allergen Reactions  . Crestor [Rosuvastatin Calcium] Other (See Comments)    Myalgias, Interfering with Gait  . Entresto [Sacubitril-Valsartan] Other (See Comments)    Angioedema  . Plavix [Clopidogrel]     Nose bleeds, swellings, whelps on legs & back, itching  . Lipitor [Atorvastatin] Other (See Comments)    REACTION: sore legs  . Ancef [Cefazolin] Rash    Describes itching and rash, but said 'it wasn't that bad'    Patient Measurements: Height: 5\' 10"  (177.8 cm) Weight: 229 lb 8 oz (104.1 kg) IBW/kg (Calculated) : 73  Vital Signs: Temp: 98.3 F (36.8 C) (06/12 0753) Temp Source: Oral (06/12 0753) BP: 124/54 (06/12 0753) Pulse Rate: 68 (06/12 0753)  Labs:  Recent Labs  05/24/17 0236  05/24/17 0707 05/24/17 1918 05/25/17 0251 05/25/17 1755 05/26/17 0512  HGB 11.1*  --   --   --  11.2*  --  11.4*  HCT 35.2*  --   --   --  35.6*  --  36.9*  PLT 162  --   --   --  146*  --  160  APTT  --   --  115* 58* 62*  --   --   HEPARINUNFRC  --   < > 0.73*  --  0.24* 0.27* <0.10*  CREATININE 1.50*  --   --   --  1.27*  --  1.15  < > = values in this interval not displayed.  Estimated Creatinine Clearance: 69.1 mL/min (by C-G formula based on SCr of 1.15 mg/dL).  Assessment: Heparin while Xarelto on hold s/p cardiac arrest.  Heparin level undetectable this AM - heparin turned off in anticipation of ICD lead replacement today.  Goal of Therapy:  Heparin level 0.3-0.7 units/ml Monitor platelets by anticoagulation protocol: Yes   Plan:  -F/u after procedure  -Will need to resume heparin vs. Restart Xarelto?  Uvaldo Rising, BCPS  Clinical Pharmacist Pager [phone]  05/26/2017 10:34 AM

## 2017-05-26 NOTE — Interval H&P Note (Signed)
History and Physical Interval Note:  05/26/2017 1:19 PM  Wayne [name]  has presented today for surgery, with the diagnosis of lead malfunction  The various methods of treatment have been discussed with the patient and family. After consideration of risks, benefits and other options for treatment, the patient has consented to  Procedure(s): Lead Revision/Repair (N/A) as a surgical intervention .  The patient's history has been reviewed, patient examined, no change in status, stable for surgery.  I have reviewed the patient's chart and labs.  Questions were answered to the patient's satisfaction.     [name]

## 2017-05-26 NOTE — Discharge Summary (Signed)
ELECTROPHYSIOLOGY PROCEDURE DISCHARGE SUMMARY    Patient ID: Wayne [name] [name],  MRN: [id number], DOB/AGE: April 13, 1941 76 y.o.  Admit date: 05/22/2017 Discharge date: 05/27/17  Primary Care Physician: [name], MD  Primary Cardiologist: Dr. Aundra Dubin Electrophysiologist: Dr. Lovena Le  Primary Discharge Diagnosis:  1. Cardiac Arrest 2. ICD lead undersensing     S/p RV lead extraction with new lead implant  Secondary Discharge Diagnosis:  1. ICM w/ICD 2. Chronic CHF (systolic)  3. HTN 4. HLD 5. DM 6. CAD 7. Persistent  AFib     CHA2DS2Vasc is 5, on Xarelto  Allergies  Allergen Reactions  . Crestor [Rosuvastatin Calcium] Other (See Comments)    Myalgias, Interfering with Gait  . Entresto [Sacubitril-Valsartan] Other (See Comments)    Angioedema  . Plavix [Clopidogrel]     Nose bleeds, swellings, whelps on legs & back, itching  . Lipitor [Atorvastatin] Other (See Comments)    REACTION: sore legs  . Ancef [Cefazolin] Rash    Describes itching and rash, but said 'it wasn't that bad'     Procedures This Admission:  1.  05/26/17: Dr. Lovena Le Conclusion: Successful extraction of a Medtronic active fixation single coil defibrillation lead which had developed under sensing and subsequent failure of the lead, and successful insertion of a new Medtronic active fixation single coil defibrillation lead  Medtronic active fixation defibrillator lead, serial number [id number] DFT's were successful at 20 J.  There were no immediate post procedure complications. 2.  CXR on demonstrated no pneumothorax status post device implantation.   Brief HPI: Wayne [name] [name] is a 76 y.o. male was with PMHx of CAD, ICM, chronic CHF, ICD, HTN, HLD, DM, admitted to [facility] 05/22/17 after a witnessed cardiac arrest. 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 Per wife, he was pushing a couch and then stopped and said "wait a minute" before he became pale and collapsed. Wife was  able to catch him and break his fall. She states that he was breathing however was unresponsive. EMS happened to be outside of the store; therefore, she asked [place] to associate to run and get them. While waiting for EMS, wife states that he took to pick breaths and then started turning blue in the neck and face. Upon EMS arrival, he was noted to have VT rhythm and as they were about to begin CPR, his ICD fired with successful conversion to NSR.  Downtime of roughly 1 minute  Hospital Course:  The patient was admitted He was intubated. He has had return of spontaneous movement and decision made not to cool him, he was seen by EP service started on IV amiodarone, device interrogation noted Therapy appears to have been held for almost 3 minutes as his VF became very fine and was undersensed so that the device thought he was in NS VT. He was successfully defibrillated back to NSR.  He was able to be extubated 05/23/17.  He had no reported pre-arrest anginal symptoms and with a cath done in recent history, and hx of spontaneous VT was felt to be a primary arrhythmic event and no new ischemic w/u was pursued.  He was transitioned to PO amiodarone and remained without recurrent arrhythmias.  05/26/17, he underwent RV lead extraction and new RV led implant by Dr. Lovena Le with details as outlined above. Given it had been 6 months since his last intervention, and carries a complicated 1/c and anitiplatelet history was decided to discharge off the  Brilinta on Xarelto only.  He was monitored on telemetry demonstrated SR without furtherarrhythmias.  Left chest was without hematoma or ecchymosis.  The device was interrogated today, post procedure day 1 and found to be functioning normally.  CXR was obtained and demonstrated no pneumothorax status post device implantation.  Wound care, arm mobility, and restrictions were reviewed with the patient.  The patient was examined by Dr.Taylor and considered stable for discharge to home.  F?u with CHF team and close EP follow up has been arranged.  We ill discontinue his Brilinta as discussed by primary cardiology team now 6 months s/p PCI and going forward the usual maintenance dose of Xarelto and his other medicines as directed by CHF/primary cardiology team.  The patient has chest wall tenderness s/p CPR and will supply 10 pills of oxycodone for him.  I have looked in [place] and he has no active/recent narcotic prescriptions.  (Tramadol is listed on home medicine list, though the patient denies having any pain medicine at home and I do not see record of this in [place]).  I made the patient aware of 6 months no driving by [place] law, he is aware and understands that this clock restarts given this new event, will continue amiodarone 200mg  BID.    The patient's discharge medications include an ARB (losartan) and beta blocker (carvedilol).   Physical Exam: Vitals:   05/26/17 1902 05/26/17 2042 05/27/17 0020 05/27/17 0416  BP: (!) 122/54 (!) 116/50 (!) 117/55 103/77  Pulse: 74 74 79 77  Resp:  20 18 18   Temp:  98.1 F (36.7 C) 98.1 F (36.7 C) 98.5 F (36.9 C)  TempSrc:  Oral Oral Oral  SpO2:  93% 97% 96%  Weight:    228 lb (103.4 kg)  Height:        GEN- The patient is well appearing, alert and oriented x 3 today.   HEENT: normocephalic, atraumatic; sclera clear, conjunctiva pink; hearing intact; oropharynx clear Lungs-  CTA b/l, normal work of breathing.  No wheezes, rales, rhonchi Heart- RRR, no murmurs, rubs or gallops, PMI not laterally displaced GI- soft, non-tender, non-distended Extremities- no clubbing, cyanosis, or edema MS- no significant deformity or atrophy, + chest wall tenderness Skin- warm and dry, no rash or lesion, left chest without hematoma/ecchymosis Psych- euthymic mood, full affect Neuro- no gross defecits  Labs:   Lab Results  Component Value Date   WBC 6.4 05/27/2017   HGB 10.9 (L) 05/27/2017   HCT 35.0 (L) 05/27/2017   MCV 90.9  05/27/2017   PLT 176 05/27/2017    Recent Labs Lab 05/22/17 1553  05/26/17 0512  NA 138  < > 138  K 4.7  < > 4.1  CL 106  < > 107  CO2 24  < > 21*  BUN 34*  < > 15  CREATININE 1.77*  < > 1.15  CALCIUM 8.9  < > 8.6*  PROT 6.7  --   --   BILITOT 0.9  --   --   ALKPHOS 70  --   --   ALT 57  --   --   AST 62*  --   --   GLUCOSE 300*  < > 158*  < > = values in this interval not displayed.  Discharge Medications:  Allergies as of 05/27/2017      Reactions   Crestor [rosuvastatin Calcium] Other (See Comments)   Myalgias, Interfering with Gait   Entresto [sacubitril-valsartan] Other (See Comments)  Angioedema   Plavix [clopidogrel]    Nose bleeds, swellings, whelps on legs & back, itching   Lipitor [atorvastatin] Other (See Comments)   REACTION: sore legs   Ancef [cefazolin] Rash   Describes itching and rash, but said 'it wasn't that bad'      Medication List    STOP taking these medications   carvedilol 12.5 MG tablet Commonly known as:  COREG   furosemide 20 MG tablet Commonly known as:  LASIX   ticagrelor 60 MG Tabs tablet Commonly known as:  BRILINTA   traMADol 50 MG tablet Commonly known as:  ULTRAM     TAKE these medications   accu-chek soft touch lancets Check blood sugars 1-2 times per day. DX: E11.65   amiodarone 200 MG tablet Commonly known as:  PACERONE Take 1 tablet (200 mg total) by mouth 2 (two) times daily.   CENTRUM SILVER ADULT 50+ Tabs Take 1 tablet by mouth daily.   digoxin 0.125 MG tablet Commonly known as:  LANOXIN Take 0.5 tablets (0.0625 mg total) by mouth daily.   diphenhydramine-acetaminophen 25-500 MG Tabs tablet Commonly known as:  TYLENOL PM Take 1 tablet by mouth at bedtime as needed (sleep).   Fish Oil 1000 MG Caps Take 1,000 mg by mouth 2 (two) times daily.   glimepiride 2 MG tablet Commonly known as:  AMARYL TAKE 1 TABLET(2 MG) BY MOUTH DAILY BEFORE BREAKFAST   losartan 25 MG tablet Commonly known as:   COZAAR Take 0.5 tablets (12.5 mg total) by mouth daily. Start taking on:  05/28/2017 What changed:  how much to take  when to take this   oxyCODONE 5 MG immediate release tablet Commonly known as:  Oxy IR/ROXICODONE Take 1 tablet (5 mg total) by mouth every 6 (six) hours as needed for moderate pain or severe pain.   pravastatin 20 MG tablet Commonly known as:  PRAVACHOL Take 1 tablet (20 mg total) by mouth every evening.   REPATHA SURECLICK 812 MG/ML [place] Generic drug:  Evolocumab Inject 1 pen into the skin every 14 (fourteen) days.   rivaroxaban 20 MG Tabs tablet Commonly known as:  XARELTO Take 1 tablet (20 mg total) by mouth daily with supper. What changed:  medication strength  how much to take  when to take this   spironolactone 25 MG tablet Commonly known as:  ALDACTONE Take 0.5 tablets (12.5 mg total) by mouth daily.       Disposition: Home Discharge Instructions    Diet - low sodium heart healthy    Complete by:  As directed    Increase activity slowly    Complete by:  As directed      Follow-up Information    Larey Dresser, MD Follow up on 06/11/2017.   Specialty:  Cardiology Why:  at 3:40pm for hospital follow up. Garage code [phone].  Contact information: [place] [address] [phone]        [facility] Follow up on 06/04/2017.   Specialty:  Cardiology Why:  11:00AM, wound care Contact information: [address] [place]       Evans Lance, MD Follow up.   Specialty:  Cardiology Contact information: [address] [address] [phone]           Duration of Discharge Encounter: Greater than 30 minutes including physician time.  Venetia Night, PA-C 05/27/2017 12:30 PM  EP Attending  Patient seen and examined. Agree with above. He is stable for DC. New ICD lead is working  normally. Usual followup.  Mikle Bosworth.D.

## 2017-05-27 ENCOUNTER — Other Ambulatory Visit: Payer: Self-pay | Admitting: Physician Assistant

## 2017-05-27 ENCOUNTER — Telehealth: Payer: Self-pay | Admitting: Cardiology

## 2017-05-27 ENCOUNTER — Encounter (HOSPITAL_COMMUNITY): Payer: Self-pay | Admitting: Internal Medicine

## 2017-05-27 ENCOUNTER — Inpatient Hospital Stay (HOSPITAL_COMMUNITY): Payer: Medicare Other

## 2017-05-27 DIAGNOSIS — I5022 Chronic systolic (congestive) heart failure: Secondary | ICD-10-CM

## 2017-05-27 LAB — GLUCOSE, CAPILLARY
GLUCOSE-CAPILLARY: 269 mg/dL — AB (ref 65–99)
Glucose-Capillary: 207 mg/dL — ABNORMAL HIGH (ref 65–99)

## 2017-05-27 LAB — CBC
HCT: 35 % — ABNORMAL LOW (ref 39.0–52.0)
HEMOGLOBIN: 10.9 g/dL — AB (ref 13.0–17.0)
MCH: 28.3 pg (ref 26.0–34.0)
MCHC: 31.1 g/dL (ref 30.0–36.0)
MCV: 90.9 fL (ref 78.0–100.0)
Platelets: 176 10*3/uL (ref 150–400)
RBC: 3.85 MIL/uL — ABNORMAL LOW (ref 4.22–5.81)
RDW: 13.4 % (ref 11.5–15.5)
WBC: 6.4 10*3/uL (ref 4.0–10.5)

## 2017-05-27 LAB — HEPARIN LEVEL (UNFRACTIONATED)

## 2017-05-27 LAB — CULTURE, BLOOD (ROUTINE X 2)
CULTURE: NO GROWTH
Special Requests: ADEQUATE

## 2017-05-27 MED ORDER — OXYCODONE HCL 5 MG PO TABS
5.0000 mg | ORAL_TABLET | Freq: Four times a day (QID) | ORAL | Status: DC | PRN
  Administered 2017-05-27: 5 mg via ORAL
  Filled 2017-05-27: qty 1

## 2017-05-27 MED ORDER — AMIODARONE HCL 200 MG PO TABS: 200.0000 mg | ORAL_TABLET | Freq: Two times a day (BID) | ORAL | 3 refills | Status: DC

## 2017-05-27 MED ORDER — FUROSEMIDE 20 MG PO TABS: 80.0000 mg | ORAL_TABLET | Freq: Every day | ORAL | 6 refills | Status: DC

## 2017-05-27 MED ORDER — RIVAROXABAN 20 MG PO TABS: 20.0000 mg | ORAL_TABLET | Freq: Every day | ORAL | 3 refills | Status: DC

## 2017-05-27 MED ORDER — LOSARTAN POTASSIUM 25 MG PO TABS: 12.5000 mg | ORAL_TABLET | Freq: Every day | ORAL | 3 refills | Status: DC

## 2017-05-27 MED ORDER — CARVEDILOL 12.5 MG PO TABS: 12.5000 mg | ORAL_TABLET | Freq: Two times a day (BID) | ORAL | 6 refills | Status: DC

## 2017-05-27 MED ORDER — OXYCODONE HCL 5 MG PO TABS: 5.0000 mg | ORAL_TABLET | Freq: Four times a day (QID) | ORAL | 0 refills | Status: DC | PRN

## 2017-05-27 MED FILL — Sodium Chloride Irrigation Soln 0.9%: Qty: 500 | Status: AC

## 2017-05-27 MED FILL — Gentamicin Sulfate Inj 40 MG/ML: INTRAMUSCULAR | Qty: 2 | Status: AC

## 2017-05-27 NOTE — Consult Note (Signed)
[facility] CM Inpatient Consult   05/27/2017  Tobin M Bicknell 11/19/1941 [id number]  Patient was assessed for 3 admission in the past 6 month period.  Admitted with AF cardiac arrest with ICD firing.   Met with the patient and wife regarding the benefits of [facility] Care Management services with his Medicare.  Patient endorses Dr. Bruce Burchette as his primary care provider at [facility].  This office does his transition of care calls for follow up post hospital.  .Explained that [facility] Care Management is a covered benefit of insurance. Review information for [facility] Care Management and a brochure  was provided with contact information.  Explained that [facility] Care Management does not interfere with or replace any services arranged by the inpatient care management staff. Patient's wife states that they plan to return to Cardiac rehab if the insurance will continue to pay and no other needs at this time.   Patient and wife verbalize no community care management needs at this time. For questions, please contact:    , RN BSN CCM [facility] Liaison  [phone] business mobile phone Toll free office [phone]    

## 2017-05-27 NOTE — Progress Notes (Signed)
EKG performed this AM and placed in chart. Will continue to monitor.

## 2017-05-27 NOTE — Care Management Important Message (Signed)
Important Message  Patient Details  Name: Wayne [name] MRN: [id number] Date of Birth: 23-Feb-1941   Medicare Important Message Given:  Yes    Orbie Pyo 05/27/2017, 11:15 AM

## 2017-05-27 NOTE — Progress Notes (Signed)
Pt getting d/c he stated that he will take a shower once he goes home

## 2017-05-27 NOTE — Telephone Encounter (Signed)
In discussion with Dr. Lovena Le after the patient left, he advised that he wanted the the patient be continued on his coreg and lasix as he was previously taking (not to stop), and to ask the patient to hold his Xarelto until Friday.  I called the patient, we reviewed his list of medicines and he understand he will continue the carvedilol and furosemide as he was previously taking without change.  He also understands to not start his xarelto until Friday.  At the end of the call, he read back to me listed off the medicines/doses correctly.  I added the carvedilol and furosemide back to his list of current medicines in Epic as well.  I encouraged the patient to call if he had any confusion or concerns, he felt he had good understanding but said he would if there were any questions.  Tommye Standard, PA-C

## 2017-06-01 ENCOUNTER — Encounter: Payer: Medicare Other | Admitting: Physician Assistant

## 2017-06-03 ENCOUNTER — Telehealth: Payer: Self-pay | Admitting: Internal Medicine

## 2017-06-03 NOTE — Telephone Encounter (Signed)
Discussed with DOD and advised for patient to call PCP for additional oxycodone. Advised for patient to call back if symptoms worsen. ER precautions reveiwed. Patient verbalized understanding and appreciated the call.

## 2017-06-03 NOTE — Telephone Encounter (Signed)
Left message for patient to call back  

## 2017-06-03 NOTE — Telephone Encounter (Signed)
Called and spoke to patient. Patient complaining of pain in his chest. He states that he thinks that it is related to the CPR that he received. Patient denies having any SOB, lightheadedness, dizziness, arm or jaw pain, sweating, or any other symptoms. Patient was prescribed oxycodone for chest wall tenderness s/p CPR when he was discharged from the hospital. Patient states that they only prescribed him 10 pills and he does not have anymore. Patient advised to send a transmission. Will discuss with DOD.

## 2017-06-03 NOTE — Telephone Encounter (Signed)
New message   Pt is calling stating that he had his defibrillator changed. He said his chest is hurting. He said when he first got the device his chest never hurt like it does now.   Pt c/o of Chest Pain: STAT if CP now or developed within 24 hours  1. Are you having CP right now? Yes   2. Are you experiencing any other symptoms (ex. SOB, nausea, vomiting, sweating)? No   3. How long have you been experiencing CP? Ever since he came out of hospital-8 or 9 days.  4. Is your CP continuous or coming and going? yes  5. Have you taken Nitroglycerin? no ?

## 2017-06-03 NOTE — Telephone Encounter (Signed)
Spoke with Tobin Chad, RN in device and she states that the patient has not had any abnormal heart rhythms since his discharge from the hospital.

## 2017-06-03 NOTE — Telephone Encounter (Signed)
New Message ° ° pt verbalized that he is returning call for rn  °

## 2017-06-04 ENCOUNTER — Encounter: Payer: Self-pay | Admitting: Internal Medicine

## 2017-06-04 ENCOUNTER — Ambulatory Visit: Payer: Medicare Other

## 2017-06-08 NOTE — Telephone Encounter (Signed)
Received incoming call from pt. Pt states his chest is sore, across entire chest, and sore to touch. Pt requesting pain medication. Pt denies CP or SOB. Will forward to [facility] and Dr. Lovena Le.

## 2017-06-08 NOTE — Telephone Encounter (Signed)
Called, spoke with pt. Informed Dr. Lovena Le recommended pt to take Ibuprofen or Tylenol as directed for chest soreness. Pt needs to keep appt for wound check on 06/11/17. Call our office if symptoms do not resolve or worsen prior to wound check appt. Pt verbalized understanding and thanked me for calling.

## 2017-06-10 ENCOUNTER — Ambulatory Visit (HOSPITAL_COMMUNITY)
Admission: RE | Admit: 2017-06-10 | Discharge: 2017-06-10 | Disposition: A | Discharge status: Home / Self Care | Payer: Medicare Other | Source: Ambulatory Visit | Attending: Internal Medicine | Admitting: Internal Medicine

## 2017-06-10 ENCOUNTER — Telehealth (HOSPITAL_COMMUNITY): Payer: Self-pay

## 2017-06-10 ENCOUNTER — Encounter (HOSPITAL_COMMUNITY): Payer: Self-pay

## 2017-06-10 VITALS — BP 116/46 | HR 67 | Wt 229.0 lb

## 2017-06-10 DIAGNOSIS — I2510 Atherosclerotic heart disease of native coronary artery without angina pectoris: Secondary | ICD-10-CM | POA: Diagnosis not present

## 2017-06-10 DIAGNOSIS — Z955 Presence of coronary angioplasty implant and graft: Secondary | ICD-10-CM | POA: Insufficient documentation

## 2017-06-10 DIAGNOSIS — I110 Hypertensive heart disease with heart failure: Secondary | ICD-10-CM | POA: Insufficient documentation

## 2017-06-10 DIAGNOSIS — Z7984 Long term (current) use of oral hypoglycemic drugs: Secondary | ICD-10-CM | POA: Diagnosis not present

## 2017-06-10 DIAGNOSIS — N183 Chronic kidney disease, stage 3 unspecified: Secondary | ICD-10-CM

## 2017-06-10 DIAGNOSIS — Z87891 Personal history of nicotine dependence: Secondary | ICD-10-CM | POA: Diagnosis not present

## 2017-06-10 DIAGNOSIS — Z8546 Personal history of malignant neoplasm of prostate: Secondary | ICD-10-CM | POA: Insufficient documentation

## 2017-06-10 DIAGNOSIS — E1165 Type 2 diabetes mellitus with hyperglycemia: Secondary | ICD-10-CM | POA: Diagnosis not present

## 2017-06-10 DIAGNOSIS — I350 Nonrheumatic aortic (valve) stenosis: Secondary | ICD-10-CM | POA: Diagnosis not present

## 2017-06-10 DIAGNOSIS — IMO0002 Reserved for concepts with insufficient information to code with codable children: Secondary | ICD-10-CM

## 2017-06-10 DIAGNOSIS — Z7901 Long term (current) use of anticoagulants: Secondary | ICD-10-CM | POA: Insufficient documentation

## 2017-06-10 DIAGNOSIS — E1151 Type 2 diabetes mellitus with diabetic peripheral angiopathy without gangrene: Secondary | ICD-10-CM | POA: Insufficient documentation

## 2017-06-10 DIAGNOSIS — E669 Obesity, unspecified: Secondary | ICD-10-CM | POA: Diagnosis not present

## 2017-06-10 DIAGNOSIS — Z95810 Presence of automatic (implantable) cardiac defibrillator: Secondary | ICD-10-CM | POA: Insufficient documentation

## 2017-06-10 DIAGNOSIS — I5022 Chronic systolic (congestive) heart failure: Secondary | ICD-10-CM | POA: Diagnosis not present

## 2017-06-10 DIAGNOSIS — E785 Hyperlipidemia, unspecified: Secondary | ICD-10-CM | POA: Diagnosis not present

## 2017-06-10 DIAGNOSIS — M1990 Unspecified osteoarthritis, unspecified site: Secondary | ICD-10-CM | POA: Diagnosis not present

## 2017-06-10 DIAGNOSIS — Z6832 Body mass index (BMI) 32.0-32.9, adult: Secondary | ICD-10-CM | POA: Diagnosis not present

## 2017-06-10 DIAGNOSIS — I255 Ischemic cardiomyopathy: Secondary | ICD-10-CM | POA: Insufficient documentation

## 2017-06-10 DIAGNOSIS — K219 Gastro-esophageal reflux disease without esophagitis: Secondary | ICD-10-CM | POA: Insufficient documentation

## 2017-06-10 DIAGNOSIS — I472 Ventricular tachycardia, unspecified: Secondary | ICD-10-CM

## 2017-06-10 DIAGNOSIS — G4733 Obstructive sleep apnea (adult) (pediatric): Secondary | ICD-10-CM | POA: Insufficient documentation

## 2017-06-10 DIAGNOSIS — M353 Polymyalgia rheumatica: Secondary | ICD-10-CM | POA: Diagnosis not present

## 2017-06-10 DIAGNOSIS — Z79899 Other long term (current) drug therapy: Secondary | ICD-10-CM | POA: Diagnosis not present

## 2017-06-10 DIAGNOSIS — Z8249 Family history of ischemic heart disease and other diseases of the circulatory system: Secondary | ICD-10-CM | POA: Diagnosis not present

## 2017-06-10 DIAGNOSIS — E118 Type 2 diabetes mellitus with unspecified complications: Secondary | ICD-10-CM

## 2017-06-10 DIAGNOSIS — Z811 Family history of alcohol abuse and dependence: Secondary | ICD-10-CM | POA: Insufficient documentation

## 2017-06-10 DIAGNOSIS — I480 Paroxysmal atrial fibrillation: Secondary | ICD-10-CM | POA: Insufficient documentation

## 2017-06-10 DIAGNOSIS — I10 Essential (primary) hypertension: Secondary | ICD-10-CM

## 2017-06-10 LAB — MAGNESIUM: Magnesium: 2.2 mg/dL (ref 1.7–2.4)

## 2017-06-10 LAB — BASIC METABOLIC PANEL
Anion gap: 7 (ref 5–15)
BUN: 34 mg/dL — ABNORMAL HIGH (ref 6–20)
CHLORIDE: 105 mmol/L (ref 101–111)
CO2: 27 mmol/L (ref 22–32)
Calcium: 9.4 mg/dL (ref 8.9–10.3)
Creatinine, Ser: 1.42 mg/dL — ABNORMAL HIGH (ref 0.61–1.24)
GFR calc Af Amer: 55 mL/min — ABNORMAL LOW (ref >60)
GFR calc non Af Amer: 47 mL/min — ABNORMAL LOW (ref >60)
GLUCOSE: 270 mg/dL — AB (ref 65–99)
Potassium: 4.7 mmol/L (ref 3.5–5.1)
Sodium: 139 mmol/L (ref 135–145)

## 2017-06-10 LAB — BRAIN NATRIURETIC PEPTIDE: B NATRIURETIC PEPTIDE 5: 298.1 pg/mL — AB (ref 0.0–100.0)

## 2017-06-10 MED ORDER — TRAMADOL HCL 50 MG PO TABS: 50.0000 mg | ORAL_TABLET | Freq: Three times a day (TID) | ORAL | 0 refills | Status: DC | PRN

## 2017-06-10 MED ORDER — RIVAROXABAN 20 MG PO TABS: 20.0000 mg | ORAL_TABLET | Freq: Every day | ORAL | 6 refills | Status: DC

## 2017-06-10 NOTE — Progress Notes (Signed)
Patient ID: Wayne [name], male   DOB: 1941-09-02, 76 y.o.   MRN: [id number]   Advanced Heart Failure Clinic Note   Patient ID: Wayne [name], male   DOB: 07/13/1941, 76 y.o.   MRN: [id number] PCP: Dr. Elease Hashimoto Cardiology: Dr. Earline Mayotte Wayne [name] is a 76 y.o. male  With history of DM, HTN, CAD, and ischemic cardiomyopathy presents for cardiology followup. Patient had a CHF exacerbation in 2/12 and was found to have LV systolic dysfunction with EF around 20%. LHC showed RCA, ramus, and CFX disease. RCA was subtotally occluded. Cardiac MRI showed that all walls, including the inferior wall, should be viable. Patient therefore underwent opening of his chronic totally occluded RCA as well as PCI to the ramus in 2/12. He received drug eluting stents and was on Effient.  He had an echo in 2/13 that showed EF improved to 45% with moderate LV dilation and mild LV hypertrophy.   In 5/13, he developed a large left lower leg hematoma.  He was still on Effient at that time.  ASA and Effient were stopped.  The hematoma did not resolve.  In 7/13, he was re-admitted with septic shock from MSSA from an abscess in his left gastrocnemius.  He also grew Pseudomonas from the left gastrocnemius as well.  He had a prolonged course in the hospital and later in a rehab facility.  Ultimately, he got back home again.  I had him get an echo in 1/14, and this showed EF 15% with diffuse hypokinesis and inferoposterior akinesis.  He had been off of most of his prior cardiac medications.  I took him back for [facility] in 1/14.  This showed subtotal occlusion of a small AV LCx and 95% ostial in-stent restenosis in the RCA.  He was treated in 2/14 with a Xience DES to the ostial RCA and begun on Plavix.  Unfortunately, he developed diffuse hives after starting Plavix and had to be switched to ticlopidine.  He has tolerated ticlopidine.   Echo done in 12/14 showed some improvement in LV function but EF was still low at 30-35%.  He did not want ICD.     Presented to ED 11/14/15 with worsening SOB after a few steps and CXR with CHF and bilateral effusions in the setting of marked medical non-compliance. States he stopped taking his medicines in 01/2015 (except for ASA 81 and a multivitamin).  He was feeling good and decided that he did not need them anymore.  Also c/o URI symptoms. Diuresed over 2 L with IV diuretics in the ER and started on lasix 20 mg daily.   Echo (12/16) showed EF 20-25% with diffuse hypokinesis (down from 35% in 12/15).  Echo 10/17 showed EF 15% with mildly decreased RV systolic function.    He had left and right heart catheterization in 12/17. This showed elevated right and left heart filling pressures and low cardiac output. The RCA was totally occluded with collaterals, there was 95% ostial to mid LAD stenosis.  Patient had PCI with DES to proximal-mid LAD.    He was noted to be in atrial fibrillation with RVR in 2/18 at cardiac rehab. He felt fatigued.  He was admitted and started on amiodarone for rate control. ASA was stopped, ticagrelor was decreased to 60 mg bid, and Xarelto 15 mg daily was started.  He converted spontaneously back to NSR.    He suffered a ventricular fibrillation arrest in 3/18.  Luckily, he was wearing a Lifevest and  was shocked.  He was admitted and started on amiodarone.  Medtronic ICD was placed.  Echo 3/18 with EF 20-25%.   Admitted 6/8 -> 05/27/17 due to VF arrest. ICD lead had been undersensing leading to prolongation of time to discharge.  He underwent RV lead extraction with new lead implant. Was temporarily placed on IV amio. HF meds adjusted as tolerated.   He presents today for post hospital follow up. Has had a rough few weeks.  Remains sore in the chest. Had been getting lightheadedness and dizzy even at rest up until last week. No further this week. Breathing " a whole lot better".  Chest soreness is his main complaint. No orthopnea or PND. No exertional chest pain. Denies SOB getting  around the house.    Optivol: Fluid index below threshold, Thoracic impedence mildly depressed. No further VT. Pt activity ~ 2 hours daily.   Labs (12/16): K 4.9, creatinine 1.23 Labs (1/17): K 4.7, creatinine 1.15, BNP 1433 Labs (2/17): K 4.7, creatinine 1.14, BNP 870 Labs (3/17): K 4.5, creatinine 1.10, BNP 1257 Labs (5/17): K 4.2, creatinine 1.2, BNP 818 Labs (7/17): LDL 149, HDL 40, TGs 210 Labs (9/17): K 4.9, creatinine 1.36 Labs (10/17): K 3.7, creatinine 1.2, LDL 87, HDL 32, LFTs normal Labs (12/17): K 4.5, creatinine 1.34, digoxin 0.3 Labs (2/18): K 4.4, creatinine 1.48, hgb 11.8 Labs (3/18): K 5 => 4.6, creatinine 1.54 => 1.46, digoxin 1.3 => 0.3  ECG: NSR, IVCD 128 msec  Past Medical History:  1. HYPERTENSION  2. HYPERLIPIDEMIA: Myalgias with atorvastatin and Crestor 3. ECZEMA  4. RHINITIS  5. HERPES ZOSTER OPHTHALMICUS  6. ADENOCARCINOMA, PROSTATE: Status post prostatectomy in 2009. Has had some incontinence since then.  7. Diabetes mellitus type II  8. Arthritis  9. Obesity  10. GERD: rare  11. CAD: Presented with exertional dyspnea, never had chest pain. LHC (2/12) with subtotalled proximal RCA and left to right collaterals, 90% proximal moderate-sized ramus, 95% proximal relatively small CFX, 40-50% proximal LAD. Cardiac MRI (2/12) showed EF 21%, some mild scar in basal segments but all wall segments would be expected to be viable. DES x 5 (overlapping) to RCA, DES x 1 to RI 01/30/11 .  Bled into leg with Effient use (long, complicated course).  LHC (1/14): AV LCx small with subtotal occlusion, 95% ostial instent restenosis in RCA. PCI in 2/14 to ostial RCA with 3.5 x 15 Xience DES.  Plavix allergy (hives) so put on ticlopidine.  - LHC (12/17):  the RCA was totally occluded with collaterals, there was 95% ostial to mid LAD stenosis, patient had PCI with DES to proximal-mid LAD. 12. Ischemic CMP: Echo (2/12) with moderately dilated LV, EF about 20% with diffuse hypokinesis  and inferior akinesis, pseudonormal diastolic function, mild MR, severe LAE, mildly decreased RV systolic function. RHC (2/12) with mean RA 12, PA 40/25, mean PCWP 26, CI 2.1.  Echo (5/12) with EF 40% (appeared worse to my eye) with posterior HK, basal inferior AK, inferoseptal AK, basal anteroseptal AK, mild MR.  Cardiac MRI was repeated and showed EF 32% (improved from 21%) and mild LV dilation (was severely dilated before) with diffuse hypokinesis and subendocardial scar in the basal inferior, basal posterior, and basal anterolateral segments.  Echo (2/13) with EF 45%, moderate LV dilation, mild LVH.  Echo (1/14) with EF 15%, diffuse hypokinesis, inferoposterior akinesis, mild MR, grade I diastolic dysfunction.  Echo (5/14) with EF 30-35%, mild LV dilation, akinesis of the basal inferior wall otherwise diffuse  hypokinesis.  Echo (12/14) with EF 30-35%, mild LV dilation, diffuse hypokinesis with basal inferior and posterior akinesis. Echo (12/15) with EF 35%, mildly dilated LV, wall motion abnormalities, normal RV size and systolic function. Echo (12/16) with EF 20-25%, diffuse hypokinesis, severe LV dilation, mild MR.  - Echo (10/17): EF 15%, grade II diastolic dysfunction, mild MR, mildly decreased RV systolic function.  - Possible angioedema related to Entresto.  - Hyperkalemia with spironolactone 12.5 daily.  - CPX (10/17): peak VO2 10.6, VE/VCO2 slope 48.6, RER 1.12.  Severe HF limitation.  - RHC (12/17): mean RA 14, PA 53/27, mean PCWP 25, CI 1.93.  - Echo (3/18): EF 20-25%, moderate LV dilation, mild LVH, normal RV size and systolic function, mild aortic stenosis.  - Medtronic ICD 3/18.  13. Cervical OA.  14. Polymyalgia rheumatica 15. OSA: Severe on sleep study 10/12.  On CPAP.  16. Left lower leg hematoma in setting of Effient use.  He developed a left gastrocnemius abscess and septic shock with prolonged hospitalization beginning in 7/13.  17. PAD: Lower extremity arterial doppler evaluation  in 2/17 showed occluded peroneal arteries bilaterally, ABI 1.1 (R) and 1.0 (L).   18. Carotid dopplers (10/17) with minimal disease.  19. Atrial fibrillation: Paroxysmal.  20. Ventricular fibrillation arrest: 3/18.  Medtronic ICD placed, now on amiodarone.  21. Aortic stenosis: Mild on 3/18 echo.   Family History:  Father died with MI at age 14. He was an alcoholic. Mother died with cancer at around 35.   Social History:  Occupation: retired Administrator  Married, lives in [place]  Past smoker, quit around [place] of systems complete and found to be negative unless listed in HPI.    Current Outpatient Prescriptions  Medication Sig Dispense Refill  . amiodarone (PACERONE) 200 MG tablet Take 1 tablet (200 mg total) by mouth 2 (two) times daily. 60 tablet 3  . carvedilol (COREG) 12.5 MG tablet Take 1 tablet (12.5 mg total) by mouth 2 (two) times daily. 60 tablet 6  . digoxin (LANOXIN) 0.125 MG tablet Take 0.5 tablets (0.0625 mg total) by mouth daily. 15 tablet 6  . diphenhydramine-acetaminophen (TYLENOL PM) 25-500 MG TABS tablet Take 1 tablet by mouth at bedtime as needed (sleep).    . Evolocumab (REPATHA SURECLICK) 644 MG/ML SOAJ Inject 1 pen into the skin every 14 (fourteen) days.    . furosemide (LASIX) 20 MG tablet Take 4 tablets (80 mg total) by mouth daily. 120 tablet 6  . glimepiride (AMARYL) 2 MG tablet TAKE 1 TABLET(2 MG) BY MOUTH DAILY BEFORE BREAKFAST 90 tablet 1  . Lancets (ACCU-CHEK SOFT TOUCH) lancets Check blood sugars 1-2 times per day. DX: E11.65 200 each 5  . losartan (COZAAR) 25 MG tablet Take 0.5 tablets (12.5 mg total) by mouth daily. 15 tablet 3  . Multiple Vitamins-Minerals (CENTRUM SILVER ADULT 50+) TABS Take 1 tablet by mouth daily.    . Omega-3 Fatty Acids (FISH OIL) 1000 MG CAPS Take 1,000 mg by mouth 2 (two) times daily.     . pravastatin (PRAVACHOL) 20 MG tablet Take 1 tablet (20 mg total) by mouth every evening. 30 tablet 2  . spironolactone  (ALDACTONE) 25 MG tablet Take 0.5 tablets (12.5 mg total) by mouth daily. 15 tablet 6   No current facility-administered medications for this encounter.     BP (!) 116/46 (BP Location: Left Arm, Patient Position: Sitting, Cuff Size: Normal)   Pulse 67   Wt 229 lb (103.9 kg)  SpO2 98%   BMI 32.86 kg/m    Wt Readings from Last 3 Encounters:  06/10/17 229 lb (103.9 kg)  05/27/17 228 lb (103.4 kg)  04/27/17 228 lb 8 oz (103.6 kg)   General: Well appearing. No resp difficulty. HEENT: Normal Neck: Supple. JVP 5-6. Carotids 2+ bilat; no bruits. No thyromegaly or nodule noted. Cor: PMI nondisplaced. RRR, No M/G/R noted. Tender bilaterally in intercostal spaces and at sternal borders.  Lungs: CTAB, normal effort. Abdomen: Obese, soft, non-tender, non-distended, no HSM. No bruits or masses. +BS  Extremities: No cyanosis, clubbing, rash, R and LLE no edema.  Neuro: Alert & orientedx3, cranial nerves grossly intact. moves all 4 extremities w/o difficulty. Affect pleasant   Assessment/Plan:  1. Chronic systolic CHF: Ischemic cardiomyopathy.  3/18 echo with EF 20-25%, diffuse hypokinesis.  CPX in 10/17 with severe HF limitation.  RHC in 12/17 with elevated filling pressures and low cardiac output.  He is now s/p PCI to proximal to mid LAD with much improved symptoms.  Medtronic ICD in 3/18 after vfib arrest.  - s/p Repeat Arrest on 05/22/17. Shock prolonged by ICD undersensing.  Underwent lead replacement.  - NYHA class II.  - Volume status stable on exam and optivol.  - Continue Lasix 40 mg BID. BMET today.     - Continue spironolactone 25 daily.   - Continue losartan 25 mg bid. Of note, patient had possible angioedema related to [facility].  Would not re-challenge. - Continue Coreg 12.5 mg bid.   - Continue digoxin 0.125 mg daily. Level stable 04/27/17.     - He may be an LVAD candidate in the future.   2. CAD:  Now s/p PCI to proximal LAD.  He had an occluded RCA but there were left to right  collaterals.  Much improved symptomatically.  - Continue pravastatin.  He needs to restart Repatha.   - Anticoagulation for atrial fibrillation with antiplatelet agents is complicated with Mr Degeorge. He is allergic to Plavix, had spontaneous gastrocnemius hemorrhage with prasugrel.  He had been on ASA 81 + ticagrelor, had LAD stent in 12/17.  - Now on Xarelto 20 mg daily and OFF ticagrelor.  - He was confused about instructions and has been holding Xarelto.  Knows to restart today.  3. HLD:  - Has had myalgias with Crestor and atorvastatin.   - Continue Repatha and low dose pravastatin 4. PAD: Occluded peroneal arteries bilaterally.  No significant claudication. No change.  5. Diabetes type II: Unable to get empagliflozin due to cost. Per PCP.  6. Atrial fibrillation:   - NSR by EKG today.  - Continue Xarelto 20 mg daily. - Continue amiodarone 200 mg daily.  LFTs and TSH stable at last visit.  - Could eventually consider atrial fibrillation ablation eventually based on CASTLE-HF data if recurs. 7. Ventricular fibrillation arrest:  - On amiodarone  and has Medtronic ICD.  - No driving until 16/1096  8. OSA:  - Not using CPAP.   Doing well overall but remains sore after Arrest and CPR. Will give small supply of Tramadol.  Follow up with EP as scheduled. RTC 6-8 weeks with Dr. Aundra Dubin   Have encouraged him NOT to use a riding lawn mower for the time being.    Shirley Friar, PA-C  06/10/2017   Greater than 50% of the 25 minute visit was spent in counseling/coordination of care regarding disease state education, medication compliance, discussion of plan with MD, discussion of medical regimen with on-site Pharm-D,  and medication reconciliation.

## 2017-06-10 NOTE — Telephone Encounter (Signed)
CHF Clinic appointment reminder call placed to patient for upcoming post-hospital follow up.  Does understand purpose of this appointment and where CHF Clinic is located? Patient confirms apt date time and location  How is patient feeling? Well, no complaints  Does patient have all of their medications since their recent discharge? Yes  Patient also reminded to take all medications as prescribed on the day of his/her appointment and to bring all medications to this appointment.  Advised to call our office for tardiness or cancellations/rescheduling needs.  Leory Plowman, Guinevere Ferrari

## 2017-06-10 NOTE — Progress Notes (Addendum)
Advanced Heart Failure Medication Review by a Pharmacist  Does the patient  feel that his/her medications are working for him/her?  yes  Has the patient been experiencing any side effects to the medications prescribed?  yes - dizziness when sitting and standing, numbness in hands and feet  Does the patient measure his/her own blood pressure or blood glucose at home?  yes   Does the patient have any problems obtaining medications due to transportation or finances?   no  Understanding of regimen: good Understanding of indications: fair Potential of compliance: fair Patient understands to avoid NSAIDs. Patient understands to avoid decongestants.  Issues to address at subsequent visits: dizziness, xarelto adherence   Pharmacist comments:  Mr. Ord is a pleasant 76 yo male presenting with wife and part of a medication list. Patient has decent understanding of what he is currently taking, however he seems to have gotten confused over Xarelto. Patient states that Dr. Lovena Le d/c'd this med but upon chart review, patient was supposed to resume Xarelto 05/29/17. Patient also complains of dizziness while sitting, chest "hurt", and numbness in hands and feet. Patient states he is taking Advil for the chest discomfort. Advised patient to stop all NSAIDs and use APAP instead. Patient does not have any drug insurance at this time as penalty for signing up for Medicare is unaffordable for him.   Medication Samples have been provided to the patient.  Drug name: Xarelto       Strength: 20mg         Qty: 7  LOT: [id number]  Exp.Date: 12/20  Dosing instructions: Take 1 tablet by mouth once daily  The patient has been instructed regarding the correct time, dose, and frequency of taking this medication, including desired effects and most common side effects.   Carlean Jews 10:03 AM 06/10/2017   Carlean Jews, Pharm.D. PGY1 Pharmacy Resident 6/27/201810:03 AM Pager [phone]  Time with patient:  15 mins Preparation and documentation time: 5 mins Total time: 20 mins

## 2017-06-10 NOTE — Patient Instructions (Signed)
Routine lab work today. Will notify you of abnormal results, otherwise no news is good news!  RESTART Xarelto 20 mg tablets once daily as needed.  Take Tramadol 50 mg tablet once every 8 hours as needed for moderate pain.  Follow up 2 months with Dr. Aundra Dubin. Take all medication as prescribed the day of your appointment. Bring all medications with you to your appointment.  Do the following things EVERYDAY: 1) Weigh yourself in the morning before breakfast. Write it down and keep it in a log. 2) Take your medicines as prescribed 3) Eat low salt foods-Limit salt (sodium) to 2000 mg per day.  4) Stay as active as you can everyday 5) Limit all fluids for the day to less than 2 liters

## 2017-06-11 ENCOUNTER — Ambulatory Visit (INDEPENDENT_AMBULATORY_CARE_PROVIDER_SITE_OTHER): Payer: Medicare Other | Admitting: *Deleted

## 2017-06-11 ENCOUNTER — Encounter (HOSPITAL_COMMUNITY): Payer: Medicare Other

## 2017-06-11 DIAGNOSIS — I472 Ventricular tachycardia, unspecified: Secondary | ICD-10-CM

## 2017-06-11 DIAGNOSIS — I5022 Chronic systolic (congestive) heart failure: Secondary | ICD-10-CM | POA: Diagnosis not present

## 2017-06-11 LAB — CUP PACEART INCLINIC DEVICE CHECK
Brady Statistic RV Percent Paced: 0.01 %
Date Time Interrogation Session: 20180628095344
HighPow Impedance: 62 Ohm
Implantable Lead Implant Date: 20180612
Lead Channel Impedance Value: 323 Ohm
Lead Channel Impedance Value: 399 Ohm
Lead Channel Pacing Threshold Amplitude: 1 V
Lead Channel Sensing Intrinsic Amplitude: 11.625 mV
Lead Channel Setting Pacing Amplitude: 2.5 V
Lead Channel Setting Sensing Sensitivity: 0.3 mV
MDC IDC LEAD LOCATION: 753860
MDC IDC MSMT BATTERY REMAINING LONGEVITY: 135 mo
MDC IDC MSMT BATTERY VOLTAGE: 3.07 V
MDC IDC MSMT LEADCHNL RV PACING THRESHOLD PULSEWIDTH: 0.4 ms
MDC IDC MSMT LEADCHNL RV SENSING INTR AMPL: 12 mV
MDC IDC PG IMPLANT DT: 20180103
MDC IDC SET LEADCHNL RV PACING PULSEWIDTH: 0.4 ms

## 2017-06-11 NOTE — Progress Notes (Signed)
Wound check appointment s/p ICD lead revision 05/26/17 due to lead failure (cardiac arrest 05/22/17). Steri-strips removed. Wound without redness or edema. Incision edges approximated, wound well healed. Normal device function. Threshold, sensing, and impedances consistent with implant measurements. Histogram distribution appropriate for patient and level of activity. No ventricular arrhythmias noted. Patient educated about wound care, arm mobility, lifting restrictions, shock plan. ROV with GT 06/23/17.

## 2017-06-12 ENCOUNTER — Telehealth (HOSPITAL_COMMUNITY): Payer: Self-pay | Admitting: *Deleted

## 2017-06-12 DIAGNOSIS — I5022 Chronic systolic (congestive) heart failure: Secondary | ICD-10-CM

## 2017-06-12 NOTE — Telephone Encounter (Signed)
Basic metabolic panel  Order: [id number]  Status:  Final result Visible to patient:  No (Not Released) Dx:  Systolic CHF, chronic (Home)  Notes recorded by Kennieth Rad, RN on 06/12/2017 at 10:02 AM EDT Called and spoke with patient and he is agreeable with plan. I have scheduled his lab appointment and ordered BMET. ------  Notes recorded by Kerry Dory, CMA on 06/11/2017 at 4:02 PM EDT Left message for patient to call back.Z:[phone]  ------  Notes recorded by Shirley Friar, PA-C on 06/10/2017 at 11:14 AM EDT  Creatinine mildly elevated. Please recheck 2 weeks.    Legrand Como [address] [place], PA-C 06/10/2017 11:14 AM

## 2017-06-15 ENCOUNTER — Encounter: Payer: Medicare Other | Admitting: Physician Assistant

## 2017-06-18 ENCOUNTER — Inpatient Hospital Stay (HOSPITAL_COMMUNITY)
Admission: EM | Admit: 2017-06-18 | Discharge: 2017-06-21 | DRG: 274 | Disposition: A | Discharge status: Home / Self Care | Payer: Medicare Other | Attending: Internal Medicine | Admitting: Internal Medicine

## 2017-06-18 ENCOUNTER — Emergency Department (HOSPITAL_COMMUNITY): Payer: Medicare Other

## 2017-06-18 DIAGNOSIS — Z87891 Personal history of nicotine dependence: Secondary | ICD-10-CM

## 2017-06-18 DIAGNOSIS — Z95810 Presence of automatic (implantable) cardiac defibrillator: Secondary | ICD-10-CM | POA: Diagnosis not present

## 2017-06-18 DIAGNOSIS — E1122 Type 2 diabetes mellitus with diabetic chronic kidney disease: Secondary | ICD-10-CM | POA: Diagnosis present

## 2017-06-18 DIAGNOSIS — M353 Polymyalgia rheumatica: Secondary | ICD-10-CM | POA: Diagnosis not present

## 2017-06-18 DIAGNOSIS — Z955 Presence of coronary angioplasty implant and graft: Secondary | ICD-10-CM | POA: Diagnosis not present

## 2017-06-18 DIAGNOSIS — Z7901 Long term (current) use of anticoagulants: Secondary | ICD-10-CM

## 2017-06-18 DIAGNOSIS — K219 Gastro-esophageal reflux disease without esophagitis: Secondary | ICD-10-CM | POA: Diagnosis not present

## 2017-06-18 DIAGNOSIS — R55 Syncope and collapse: Secondary | ICD-10-CM | POA: Diagnosis not present

## 2017-06-18 DIAGNOSIS — I481 Persistent atrial fibrillation: Secondary | ICD-10-CM | POA: Diagnosis present

## 2017-06-18 DIAGNOSIS — J9811 Atelectasis: Secondary | ICD-10-CM | POA: Diagnosis not present

## 2017-06-18 DIAGNOSIS — I472 Ventricular tachycardia, unspecified: Secondary | ICD-10-CM

## 2017-06-18 DIAGNOSIS — I5022 Chronic systolic (congestive) heart failure: Secondary | ICD-10-CM | POA: Diagnosis not present

## 2017-06-18 DIAGNOSIS — IMO0002 Reserved for concepts with insufficient information to code with codable children: Secondary | ICD-10-CM | POA: Diagnosis present

## 2017-06-18 DIAGNOSIS — G4733 Obstructive sleep apnea (adult) (pediatric): Secondary | ICD-10-CM | POA: Diagnosis not present

## 2017-06-18 DIAGNOSIS — N183 Chronic kidney disease, stage 3 unspecified: Secondary | ICD-10-CM | POA: Diagnosis present

## 2017-06-18 DIAGNOSIS — E785 Hyperlipidemia, unspecified: Secondary | ICD-10-CM | POA: Diagnosis present

## 2017-06-18 DIAGNOSIS — Z8546 Personal history of malignant neoplasm of prostate: Secondary | ICD-10-CM | POA: Diagnosis not present

## 2017-06-18 DIAGNOSIS — I255 Ischemic cardiomyopathy: Secondary | ICD-10-CM | POA: Diagnosis present

## 2017-06-18 DIAGNOSIS — I480 Paroxysmal atrial fibrillation: Secondary | ICD-10-CM | POA: Diagnosis present

## 2017-06-18 DIAGNOSIS — I130 Hypertensive heart and chronic kidney disease with heart failure and stage 1 through stage 4 chronic kidney disease, or unspecified chronic kidney disease: Secondary | ICD-10-CM | POA: Diagnosis not present

## 2017-06-18 DIAGNOSIS — I2510 Atherosclerotic heart disease of native coronary artery without angina pectoris: Secondary | ICD-10-CM | POA: Diagnosis not present

## 2017-06-18 DIAGNOSIS — E1165 Type 2 diabetes mellitus with hyperglycemia: Secondary | ICD-10-CM | POA: Diagnosis present

## 2017-06-18 DIAGNOSIS — I10 Essential (primary) hypertension: Secondary | ICD-10-CM | POA: Diagnosis present

## 2017-06-18 DIAGNOSIS — Z79899 Other long term (current) drug therapy: Secondary | ICD-10-CM

## 2017-06-18 DIAGNOSIS — Z4502 Encounter for adjustment and management of automatic implantable cardiac defibrillator: Secondary | ICD-10-CM | POA: Diagnosis not present

## 2017-06-18 DIAGNOSIS — I5042 Chronic combined systolic (congestive) and diastolic (congestive) heart failure: Secondary | ICD-10-CM | POA: Diagnosis not present

## 2017-06-18 DIAGNOSIS — Z8249 Family history of ischemic heart disease and other diseases of the circulatory system: Secondary | ICD-10-CM

## 2017-06-18 DIAGNOSIS — E1140 Type 2 diabetes mellitus with diabetic neuropathy, unspecified: Secondary | ICD-10-CM | POA: Diagnosis present

## 2017-06-18 DIAGNOSIS — R404 Transient alteration of awareness: Secondary | ICD-10-CM | POA: Diagnosis not present

## 2017-06-18 LAB — CBC WITH DIFFERENTIAL/PLATELET
BASOS ABS: 0 10*3/uL (ref 0.0–0.1)
Basophils Relative: 0 %
EOS PCT: 5 %
Eosinophils Absolute: 0.4 10*3/uL (ref 0.0–0.7)
HCT: 38 % — ABNORMAL LOW (ref 39.0–52.0)
Hemoglobin: 12.3 g/dL — ABNORMAL LOW (ref 13.0–17.0)
LYMPHS PCT: 19 %
Lymphs Abs: 1.4 10*3/uL (ref 0.7–4.0)
MCH: 29.1 pg (ref 26.0–34.0)
MCHC: 32.4 g/dL (ref 30.0–36.0)
MCV: 90 fL (ref 78.0–100.0)
Monocytes Absolute: 0.5 10*3/uL (ref 0.1–1.0)
Monocytes Relative: 7 %
NEUTROS ABS: 4.8 10*3/uL (ref 1.7–7.7)
NEUTROS PCT: 69 %
PLATELETS: 148 10*3/uL — AB (ref 150–400)
RBC: 4.22 MIL/uL (ref 4.22–5.81)
RDW: 13.9 % (ref 11.5–15.5)
WBC: 7.1 10*3/uL (ref 4.0–10.5)

## 2017-06-18 LAB — I-STAT CHEM 8, ED
BUN: 33 mg/dL — AB (ref 6–20)
CHLORIDE: 103 mmol/L (ref 101–111)
Calcium, Ion: 1.07 mmol/L — ABNORMAL LOW (ref 1.15–1.40)
Creatinine, Ser: 1.5 mg/dL — ABNORMAL HIGH (ref 0.61–1.24)
Glucose, Bld: 325 mg/dL — ABNORMAL HIGH (ref 65–99)
HEMATOCRIT: 36 % — AB (ref 39.0–52.0)
Hemoglobin: 12.2 g/dL — ABNORMAL LOW (ref 13.0–17.0)
POTASSIUM: 5.1 mmol/L (ref 3.5–5.1)
SODIUM: 138 mmol/L (ref 135–145)
TCO2: 25 mmol/L (ref 0–100)

## 2017-06-18 LAB — COMPREHENSIVE METABOLIC PANEL
ALT: 25 U/L (ref 17–63)
AST: 27 U/L (ref 15–41)
Albumin: 3.6 g/dL (ref 3.5–5.0)
Alkaline Phosphatase: 123 U/L (ref 38–126)
Anion gap: 9 (ref 5–15)
BUN: 30 mg/dL — AB (ref 6–20)
CHLORIDE: 103 mmol/L (ref 101–111)
CO2: 22 mmol/L (ref 22–32)
CREATININE: 1.55 mg/dL — AB (ref 0.61–1.24)
Calcium: 9.4 mg/dL (ref 8.9–10.3)
GFR calc Af Amer: 50 mL/min — ABNORMAL LOW (ref >60)
GFR, EST NON AFRICAN AMERICAN: 43 mL/min — AB (ref >60)
Glucose, Bld: 326 mg/dL — ABNORMAL HIGH (ref 65–99)
Potassium: 5.1 mmol/L (ref 3.5–5.1)
SODIUM: 134 mmol/L — AB (ref 135–145)
Total Bilirubin: 0.6 mg/dL (ref 0.3–1.2)
Total Protein: 6.3 g/dL — ABNORMAL LOW (ref 6.5–8.1)

## 2017-06-18 LAB — I-STAT TROPONIN, ED: Troponin i, poc: 0.08 ng/mL (ref 0.00–0.08)

## 2017-06-18 NOTE — Progress Notes (Signed)
   06/18/17 1908  Clinical Encounter Type  Visited With Family  Visit Type ED  Referral From Nurse  Spiritual Encounters  Spiritual Needs Emotional  Stress Factors  Family Stress Factors Health changes  Advance Directives (For Healthcare)  Does Patient Have a Medical Advance Directive? Yes  Type of Paramedic of [place];Living will  Nurse approached chaplain while chaplain in ED on other cases. Reported extremely distraught woman in waiting room waiting for her husband to arrive by ambulance. Chaplain went out and offered support, went out again when husband arrived to let wife know he was in the hospital. Charlynn Grimes, [place], Chaplain
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
## 2017-06-18 NOTE — ED Notes (Signed)
Patient transported to X-ray 

## 2017-06-18 NOTE — ED Triage Notes (Addendum)
Pt arrived via EMS from home after experiencing a syncopal episode. Pt had defibrillator placed in march and felt as though it fired several times while he was working in his shed. Per EMS, pt was working in yard and admitted to not drinking a lot of fluids today and was wearing a long sleeve shirt. Initial BP from EMS was 130/80. Pulse 81. EMS reported changes in 12-lead EKGs strips placed at bedside. Pt is alert and oriented x4. No c/o pain upon arrival.

## 2017-06-19 ENCOUNTER — Encounter (HOSPITAL_COMMUNITY)
Admission: EM | Disposition: A | Discharge status: Home / Self Care | Payer: Self-pay | Source: Home / Self Care | Attending: Internal Medicine

## 2017-06-19 DIAGNOSIS — I472 Ventricular tachycardia: Principal | ICD-10-CM

## 2017-06-19 DIAGNOSIS — Z4502 Encounter for adjustment and management of automatic implantable cardiac defibrillator: Secondary | ICD-10-CM

## 2017-06-19 DIAGNOSIS — R55 Syncope and collapse: Secondary | ICD-10-CM | POA: Insufficient documentation

## 2017-06-19 DIAGNOSIS — I5022 Chronic systolic (congestive) heart failure: Secondary | ICD-10-CM

## 2017-06-19 HISTORY — PX: V TACH ABLATION: EP1227

## 2017-06-19 LAB — GLUCOSE, CAPILLARY
GLUCOSE-CAPILLARY: 266 mg/dL — AB (ref 65–99)
GLUCOSE-CAPILLARY: 269 mg/dL — AB (ref 65–99)
Glucose-Capillary: 194 mg/dL — ABNORMAL HIGH (ref 65–99)
Glucose-Capillary: 132 mg/dL — ABNORMAL HIGH (ref 65–99)
Glucose-Capillary: 193 mg/dL — ABNORMAL HIGH (ref 65–99)

## 2017-06-19 LAB — POCT ACTIVATED CLOTTING TIME
ACTIVATED CLOTTING TIME: 202 s
Activated Clotting Time: 191 seconds
Activated Clotting Time: 208 seconds
Activated Clotting Time: 219 seconds
Activated Clotting Time: 186 seconds

## 2017-06-19 LAB — BASIC METABOLIC PANEL
ANION GAP: 8 (ref 5–15)
BUN: 25 mg/dL — AB (ref 6–20)
CHLORIDE: 106 mmol/L (ref 101–111)
CO2: 23 mmol/L (ref 22–32)
Calcium: 9.3 mg/dL (ref 8.9–10.3)
Creatinine, Ser: 1.35 mg/dL — ABNORMAL HIGH (ref 0.61–1.24)
GFR calc Af Amer: 59 mL/min — ABNORMAL LOW (ref >60)
GFR calc non Af Amer: 50 mL/min — ABNORMAL LOW (ref >60)
Glucose, Bld: 203 mg/dL — ABNORMAL HIGH (ref 65–99)
POTASSIUM: 4.2 mmol/L (ref 3.5–5.1)
SODIUM: 137 mmol/L (ref 135–145)

## 2017-06-19 LAB — TROPONIN I
TROPONIN I: 0.22 ng/mL — AB (ref <0.03)
TROPONIN I: 0.13 ng/mL — AB (ref <0.03)

## 2017-06-19 LAB — DIGOXIN LEVEL: DIGOXIN LVL: 0.6 ng/mL — AB (ref 0.8–2.0)

## 2017-06-19 LAB — MRSA PCR SCREENING: MRSA by PCR: NEGATIVE

## 2017-06-19 LAB — MAGNESIUM: MAGNESIUM: 2.2 mg/dL (ref 1.7–2.4)

## 2017-06-19 SURGERY — V TACH ABLATION

## 2017-06-19 MED ORDER — FENTANYL CITRATE (PF) 100 MCG/2ML IJ SOLN
INTRAMUSCULAR | Status: AC
  Filled 2017-06-19: qty 2

## 2017-06-19 MED ORDER — SPIRONOLACTONE 25 MG PO TABS
12.5000 mg | ORAL_TABLET | Freq: Every day | ORAL | Status: DC
  Administered 2017-06-19 – 2017-06-21 (×3): 12.5 mg via ORAL
  Filled 2017-06-19 (×3): qty 1

## 2017-06-19 MED ORDER — SODIUM CHLORIDE 0.9% FLUSH: 3.0000 mL | INTRAVENOUS | Status: DC | PRN

## 2017-06-19 MED ORDER — INSULIN ASPART 100 UNIT/ML ~~LOC~~ SOLN: 0.0000 [IU] | Freq: Every day | SUBCUTANEOUS | Status: DC

## 2017-06-19 MED ORDER — SODIUM CHLORIDE 0.9% FLUSH
3.0000 mL | Freq: Two times a day (BID) | INTRAVENOUS | Status: DC
  Administered 2017-06-19 – 2017-06-21 (×5): 3 mL via INTRAVENOUS

## 2017-06-19 MED ORDER — PRAVASTATIN SODIUM 20 MG PO TABS
20.0000 mg | ORAL_TABLET | Freq: Every evening | ORAL | Status: DC
  Administered 2017-06-20: 20 mg via ORAL
  Filled 2017-06-19: qty 1

## 2017-06-19 MED ORDER — ACETAMINOPHEN 500 MG PO TABS: 500.0000 mg | ORAL_TABLET | Freq: Every evening | ORAL | Status: DC | PRN

## 2017-06-19 MED ORDER — DIPHENHYDRAMINE-APAP (SLEEP) 25-500 MG PO TABS: 1.0000 | ORAL_TABLET | Freq: Every evening | ORAL | Status: DC | PRN

## 2017-06-19 MED ORDER — DIPHENHYDRAMINE HCL 25 MG PO CAPS
25.0000 mg | ORAL_CAPSULE | Freq: Every evening | ORAL | Status: DC | PRN
  Administered 2017-06-19 – 2017-06-21 (×2): 25 mg via ORAL
  Filled 2017-06-19 (×2): qty 1

## 2017-06-19 MED ORDER — INSULIN ASPART 100 UNIT/ML ~~LOC~~ SOLN
0.0000 [IU] | Freq: Three times a day (TID) | SUBCUTANEOUS | Status: DC
  Administered 2017-06-19 (×2): 8 [IU] via SUBCUTANEOUS
  Administered 2017-06-20: 5 [IU] via SUBCUTANEOUS
  Administered 2017-06-20: 11 [IU] via SUBCUTANEOUS
  Administered 2017-06-21: 5 [IU] via SUBCUTANEOUS
  Administered 2017-06-21: 8 [IU] via SUBCUTANEOUS

## 2017-06-19 MED ORDER — HEPARIN SODIUM (PORCINE) 1000 UNIT/ML IJ SOLN
INTRAMUSCULAR | Status: DC | PRN
  Administered 2017-06-19 (×2): 2000 [IU] via INTRAVENOUS
  Administered 2017-06-19: 1000 [IU] via INTRAVENOUS
  Administered 2017-06-19: 7000 [IU] via INTRAVENOUS

## 2017-06-19 MED ORDER — MIDAZOLAM HCL 5 MG/5ML IJ SOLN
INTRAMUSCULAR | Status: AC
  Filled 2017-06-19: qty 5

## 2017-06-19 MED ORDER — BUPIVACAINE HCL (PF) 0.25 % IJ SOLN
INTRAMUSCULAR | Status: DC | PRN
  Administered 2017-06-19: 45 mL

## 2017-06-19 MED ORDER — AMIODARONE LOAD VIA INFUSION
150.0000 mg | Freq: Once | INTRAVENOUS | Status: AC
  Administered 2017-06-19: 150 mg via INTRAVENOUS
  Filled 2017-06-19: qty 83.34

## 2017-06-19 MED ORDER — BUPIVACAINE HCL (PF) 0.25 % IJ SOLN
INTRAMUSCULAR | Status: AC
  Filled 2017-06-19: qty 30

## 2017-06-19 MED ORDER — SODIUM CHLORIDE 0.9 % IV SOLN: 250.0000 mL | INTRAVENOUS | Status: DC | PRN

## 2017-06-19 MED ORDER — HEPARIN SODIUM (PORCINE) 1000 UNIT/ML IJ SOLN
INTRAMUSCULAR | Status: AC
  Filled 2017-06-19: qty 1

## 2017-06-19 MED ORDER — FENTANYL CITRATE (PF) 100 MCG/2ML IJ SOLN
INTRAMUSCULAR | Status: DC | PRN
  Administered 2017-06-19: 25 ug via INTRAVENOUS
  Administered 2017-06-19: 12.5 ug via INTRAVENOUS

## 2017-06-19 MED ORDER — CARVEDILOL 12.5 MG PO TABS
12.5000 mg | ORAL_TABLET | Freq: Two times a day (BID) | ORAL | Status: DC
  Administered 2017-06-19 – 2017-06-21 (×4): 12.5 mg via ORAL
  Filled 2017-06-19 (×5): qty 1

## 2017-06-19 MED ORDER — RIVAROXABAN 20 MG PO TABS: 20.0000 mg | ORAL_TABLET | Freq: Every day | ORAL | Status: DC

## 2017-06-19 MED ORDER — ONDANSETRON HCL 4 MG/2ML IJ SOLN: 4.0000 mg | Freq: Four times a day (QID) | INTRAMUSCULAR | Status: DC | PRN

## 2017-06-19 MED ORDER — SODIUM CHLORIDE 0.9 % IV SOLN
250.0000 mL | INTRAVENOUS | Status: DC | PRN
  Administered 2017-06-19: 250 mL via INTRAVENOUS

## 2017-06-19 MED ORDER — AMIODARONE HCL IN DEXTROSE 360-4.14 MG/200ML-% IV SOLN
60.0000 mg/h | INTRAVENOUS | Status: AC
  Administered 2017-06-19 (×2): 60 mg/h via INTRAVENOUS
  Filled 2017-06-19 (×2): qty 200

## 2017-06-19 MED ORDER — LOSARTAN POTASSIUM 25 MG PO TABS
12.5000 mg | ORAL_TABLET | Freq: Every day | ORAL | Status: DC
  Administered 2017-06-19 – 2017-06-21 (×3): 12.5 mg via ORAL
  Filled 2017-06-19 (×3): qty 1

## 2017-06-19 MED ORDER — AMIODARONE LOAD VIA INFUSION
150.0000 mg | Freq: Once | INTRAVENOUS | Status: DC | PRN
  Filled 2017-06-19: qty 83.34

## 2017-06-19 MED ORDER — AMIODARONE HCL IN DEXTROSE 360-4.14 MG/200ML-% IV SOLN
30.0000 mg/h | INTRAVENOUS | Status: DC
  Administered 2017-06-19 – 2017-06-20 (×2): 30 mg/h via INTRAVENOUS
  Filled 2017-06-19 (×2): qty 200

## 2017-06-19 MED ORDER — DIGOXIN 125 MCG PO TABS
0.0625 mg | ORAL_TABLET | Freq: Every day | ORAL | Status: DC
  Administered 2017-06-19 – 2017-06-21 (×3): 0.0625 mg via ORAL
  Filled 2017-06-19 (×3): qty 1

## 2017-06-19 MED ORDER — SODIUM CHLORIDE 0.9 % IV SOLN
INTRAVENOUS | Status: DC
  Administered 2017-06-19: 09:00:00 via INTRAVENOUS

## 2017-06-19 MED ORDER — MIDAZOLAM HCL 5 MG/5ML IJ SOLN
INTRAMUSCULAR | Status: DC | PRN
  Administered 2017-06-19: 1 mg via INTRAVENOUS
  Administered 2017-06-19: 2 mg via INTRAVENOUS
  Administered 2017-06-19 (×3): 1 mg via INTRAVENOUS

## 2017-06-19 MED ORDER — SODIUM CHLORIDE 0.9% FLUSH
3.0000 mL | Freq: Two times a day (BID) | INTRAVENOUS | Status: DC
  Administered 2017-06-19 – 2017-06-20 (×2): 3 mL via INTRAVENOUS

## 2017-06-19 MED ORDER — FUROSEMIDE 40 MG PO TABS
40.0000 mg | ORAL_TABLET | Freq: Two times a day (BID) | ORAL | Status: DC
  Administered 2017-06-19 – 2017-06-21 (×4): 40 mg via ORAL
  Filled 2017-06-19 (×4): qty 1

## 2017-06-19 MED ORDER — TRAMADOL HCL 50 MG PO TABS: 50.0000 mg | ORAL_TABLET | Freq: Three times a day (TID) | ORAL | Status: DC | PRN

## 2017-06-19 MED ORDER — ACETAMINOPHEN 325 MG PO TABS: 650.0000 mg | ORAL_TABLET | ORAL | Status: DC | PRN

## 2017-06-19 MED ORDER — HEPARIN (PORCINE) IN NACL 2-0.9 UNIT/ML-% IJ SOLN
INTRAMUSCULAR | Status: AC | PRN
  Administered 2017-06-19: 500 mL

## 2017-06-19 SURGICAL SUPPLY — 13 items
BAG SNAP BAND KOVER 36X36 (MISCELLANEOUS) ×1 IMPLANT
CATH JOSEPHSON QUAD-ALLRED 6FR (CATHETERS) ×2 IMPLANT
CATH SMTCH THERMOCOOL SF DF (CATHETERS) ×1 IMPLANT
CATH WEBSTER BI DIR CS D-F CRV (CATHETERS) ×1 IMPLANT
PACK EP LATEX FREE (CUSTOM PROCEDURE TRAY) ×2
PACK EP LF (CUSTOM PROCEDURE TRAY) IMPLANT
PAD DEFIB LIFELINK (PAD) ×1 IMPLANT
PATCH CARTO3 (PAD) ×1 IMPLANT
SHEATH PINNACLE 6F 10CM (SHEATH) ×2 IMPLANT
SHEATH PINNACLE 8F 10CM (SHEATH) ×2 IMPLANT
SHIELD RADPAD SCOOP 12X17 (MISCELLANEOUS) ×1 IMPLANT
TUBING COOLFLOW (TUBING) IMPLANT
TUBING SMART ABLATE COOLFLOW (TUBING) ×1 IMPLANT

## 2017-06-19 NOTE — Consult Note (Signed)
ELECTROPHYSIOLOGY CONSULT NOTE    Patient ID: Wayne [name] MRN: [id number], DOB/AGE: 74-May-1942 74 y.o.  Admit date: 06/18/2017 Date of Consult: 06/19/2017  Primary Physician: [name], MD Primary Cardiologist: Aundra Dubin Electrophysiologist: Lovena Le  Reason for Consultation: VT with ICD shocks   HPI:  Wayne [name] is a 76 y.o. male is referred by Dr Overton Mam for evaluation of VT.  Past medical history is significant for prior cardiac arrest s/p ICD implant, CAD, ICM, hypertension, chronic systolic heart failure, persistent atrial fibrillation and recurrent VT despite amiodarone. In march of 2018, he had a resuscitated cardiac arrest while wearing a LifeVest.  He was implanted with a MDT single chamber ICD. He then had recurrent VF 05/2017 with undersensing of his ICD and underwent lead revision.  Yesterday, he was out in his building when he began to have dizziness and pre-syncope. He tried to lower himself to the ground and passed out.  He awoke and then had at least 7 ICD shocks that he can remember. He called 911 who transported him to [facility].  He has been placed on IV amiodarone and monitored on telemetry without recurrent ventricular arrhythmias.   He continues to have chest soreness from CPR. He has not had recent worsening shortness of breath or angina. He reports compliance with medications. No LE edema, nausea or vomiting.  Last echo 05/2017 demonstrated EF 25-30%, inferior wall akinesis. Cardiac MRI 11/2016 with scr basal to mid inferior wall.   Past Medical History:  Diagnosis Date  . Adenocarcinoma of prostate ([place])    s/p seed implants  . Arthritis   . Cellulitis of left leg    a. 05/2702 complicated by septic shock  . Chronic combined systolic and diastolic CHF (congestive heart failure) ([place])   . Colon polyps   . CORONARY ATHEROSCLEROSIS NATIVE CORONARY ARTERY    a. 01/2011 Cath/PCI: LM nl, LAD 40-50p, D1 80-small, LCX 95-small, RI 90, RCA 100, EF 20%;  b. 01/2011 Card MRI -  No transmural scar;  c. 01/2011 PCI RCA->5 Promus DES, RI->3.0x16 Promus DES; d. Cath 01/13/13 patent LAD & Ramus, diffuse LCx dz, RCA mult overlapping stents w/ 95% osital stenosis, EF 20%, s/p DES to ostial/prox RCA 01/24/13   . Diabetes mellitus, type 2 ([place])   . GERD (gastroesophageal reflux disease)   . Hematoma of leg    a. left leg hematoma 03/2012 in the setting of asa/effient  . Herpes zoster ophthalmicus   . HYPERLIPIDEMIA    intolerant to Lipitor (myalgias)  . HYPERTENSION   . Ischemic cardiomyopathy    a. 01/2012 Echo EF 45%, mild LVH; b. JK09%, grade 1 diastolic dysfunction, diffuse hypokinesis, inferoposterior akinesis   . Noncompliance   . Obesity   . OSA (obstructive sleep apnea)   . Polymyalgia rheumatica ([place])      Surgical History:  Past Surgical History:  Procedure Laterality Date  . CARDIAC CATHETERIZATION  01/24/2013  . CARDIAC CATHETERIZATION N/A 11/14/2016   Procedure: Right/Left Heart Cath and Coronary Angiography;  Surgeon: Larey Dresser, MD;  Location: [place] [facility];  Service: Cardiovascular;  Laterality: N/A;  . CARDIAC CATHETERIZATION N/A 11/17/2016   Procedure: Coronary Stent Intervention w/Impella;  Surgeon: Peter M Martinique, MD;  Location: [place] [facility];  Service: Cardiovascular;  Laterality: N/A;  . CARDIAC CATHETERIZATION N/A 11/17/2016   Procedure: Coronary Atherectomy;  Surgeon: Peter M Martinique, MD;  Location: [place] [facility];  Service: Cardiovascular;  Laterality: N/A;  . CORONARY ANGIOPLASTY WITH  STENT PLACEMENT  01/24/2013   DES to RCA  . CORONARY STENT PLACEMENT  2012   reports 6 stents placed  . I&D EXTREMITY  06/15/2012   Procedure: IRRIGATION AND DEBRIDEMENT EXTREMITY;  Surgeon: Newt Minion, MD;  Location: [place];  Service: Orthopedics;  Laterality: Left;  I&D Left Posterior Knee  . I&D EXTREMITY  06/30/2012   Procedure: IRRIGATION AND DEBRIDEMENT EXTREMITY;  Surgeon: Newt Minion, MD;  Location: [place];  Service: Orthopedics;   Laterality: Left;  Left Leg Irrigation and Debridement and placement of Wound VAC and application of  A-cell  . I&D EXTREMITY  07/20/2012   Procedure: IRRIGATION AND DEBRIDEMENT EXTREMITY;  Surgeon: Newt Minion, MD;  Location: [place];  Service: Orthopedics;  Laterality: Left;  Irrigation and Debridement Left Leg and Place antibiotic beads   . ICD IMPLANT N/A 02/12/2017   Procedure: ICD Implant;  Surgeon: Evans Lance, MD;  Location: [place] [facility];  Service: Cardiovascular;  Laterality: N/A;  . LEAD REVISION/REPAIR N/A 05/26/2017   Procedure: Lead Revision/Repair;  Surgeon: Evans Lance, MD;  Location: [place] [facility];  Service: Cardiovascular;  Laterality: N/A;  . PERCUTANEOUS CORONARY STENT INTERVENTION (PCI-S) N/A 01/24/2013   Procedure: PERCUTANEOUS CORONARY STENT INTERVENTION (PCI-S);  Surgeon: Wellington Hampshire, MD;  Location: [facility];  Service: Cardiovascular;  Laterality: N/A;  . PROSTATE SURGERY     cancer, seed implant  . ULTRASOUND GUIDANCE FOR VASCULAR ACCESS  11/14/2016   Procedure: Ultrasound Guidance For Vascular Access;  Surgeon: Larey Dresser, MD;  Location: [place] [facility];  Service: Cardiovascular;;     Prescriptions Prior to Admission  Medication Sig Dispense Refill Last Dose  . amiodarone (PACERONE) 200 MG tablet Take 1 tablet (200 mg total) by mouth 2 (two) times daily. (Patient taking differently: Take 200 mg by mouth daily. ) 60 tablet 3 06/18/2017 at am  . carvedilol (COREG) 12.5 MG tablet Take 1 tablet (12.5 mg total) by mouth 2 (two) times daily. 60 tablet 6 06/18/2017 at 0900  . digoxin (LANOXIN) 0.125 MG tablet Take 0.5 tablets (0.0625 mg total) by mouth daily. 15 tablet 6 06/18/2017 at am  . diphenhydramine-acetaminophen (TYLENOL PM) 25-500 MG TABS tablet Take 1 tablet by mouth at bedtime as needed (sleep).   PRN at PRN  . Evolocumab (REPATHA SURECLICK) 169 MG/ML SOAJ Inject 1 pen into the skin every 14 (fourteen) days.   06/13/2017 at am  . furosemide  (LASIX) 20 MG tablet Take 40 mg by mouth 2 (two) times daily.   6 06/18/2017 at am  . glimepiride (AMARYL) 2 MG tablet TAKE 1 TABLET(2 MG) BY MOUTH DAILY BEFORE BREAKFAST 90 tablet 1 06/18/2017 at am  . losartan (COZAAR) 25 MG tablet Take 0.5 tablets (12.5 mg total) by mouth daily. 15 tablet 3 06/18/2017 at am  . Multiple Vitamins-Minerals (CENTRUM SILVER ADULT 50+) TABS Take 1 tablet by mouth daily.   06/18/2017 at Unknown time  . Omega-3 Fatty Acids (FISH OIL) 1000 MG CAPS Take 1,000 mg by mouth 2 (two) times daily.    06/18/2017 at am  . pravastatin (PRAVACHOL) 20 MG tablet Take 1 tablet (20 mg total) by mouth every evening. 30 tablet 2 06/17/2017 at pm  . rivaroxaban (XARELTO) 20 MG TABS tablet Take 1 tablet (20 mg total) by mouth daily with supper. (Patient taking differently: Take 20 mg by mouth daily after breakfast. ) 30 tablet 6 06/18/2017 at 0900  . spironolactone (ALDACTONE) 25 MG  tablet Take 0.5 tablets (12.5 mg total) by mouth daily. 15 tablet 6 06/18/2017 at am  . traMADol (ULTRAM) 50 MG tablet Take 1 tablet (50 mg total) by mouth every 8 (eight) hours as needed for moderate pain. (Patient taking differently: Take 50 mg by mouth every 8 (eight) hours as needed (for pain). ) 20 tablet 0 PRN at PRN  . Lancets (ACCU-CHEK SOFT TOUCH) lancets Check blood sugars 1-2 times per day. DX: E11.65 200 each 5 UNK at [facility]    Inpatient Medications: . carvedilol  12.5 mg Oral BID WC  . digoxin  0.0625 mg Oral Daily  . furosemide  40 mg Oral BID  . insulin aspart  0-15 Units Subcutaneous TID WC  . insulin aspart  0-5 Units Subcutaneous QHS  . losartan  12.5 mg Oral Daily  . pravastatin  20 mg Oral QPM  . rivaroxaban  20 mg Oral QPC breakfast  . sodium chloride flush  3 mL Intravenous Q12H  . spironolactone  12.5 mg Oral Daily    Allergies:  Allergies  Allergen Reactions  . Crestor [Rosuvastatin Calcium] Other (See Comments)    Myalgia, Interfering with Gait  . Entresto [Sacubitril-Valsartan] Swelling and  Other (See Comments)    Angioedema  . Plavix [Clopidogrel] Itching, Swelling and Other (See Comments)    Nose bleeds and welts on legs & back  . Lipitor [Atorvastatin] Other (See Comments)    Makes legs sore  . Ancef [Cefazolin] Rash    Describes itching and rash, but said 'it wasn't that bad'    Social History   Social History  . Marital status: Married    Spouse name: N/A  . Number of children: 1  . Years of education: N/A   Occupational History  . 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 Smoking status: Former Smoker    Packs/day: 0.30    Years: 20.00    Types: Cigarettes    Quit date: 02/23/1989  . Smokeless tobacco: Never Used  . Alcohol use No  . Drug use: No  . Sexual activity: Not on file   Other Topics Concern  . Not on file   Social History Narrative   Retired Administrator     Family History  Problem Relation Age of Onset  . Cancer Mother 61       unknown CA  . Heart disease Father   . Alcohol abuse Father   . Heart attack Father 20     Review of Systems: All other systems reviewed and are otherwise negative except as noted above.  Physical Exam: Vitals:   06/19/17 0100 06/19/17 0200 06/19/17 0209 06/19/17 0339  BP: (!) 120/58 (!) 142/88  98/61  Pulse: 66 66  65  Resp: 15 13  15   Temp:    98.6 F (37 C)  TempSrc:    Oral  SpO2: 94% 99%  (!) 86%  Weight:   228 lb 6.3 oz (103.6 kg)     GEN- The patient is well appearing, alert and oriented x 3 today.   HEENT: normocephalic, atraumatic; sclera clear, conjunctiva pink; hearing intact; oropharynx clear; neck supple  Lungs- Clear to ausculation bilaterally, normal work of breathing.  No wheezes, rales, rhonchi Heart- Regular rate and rhythm, no murmurs, rubs or gallops  GI- soft, non-tender, non-distended, bowel sounds present  Extremities- no clubbing, cyanosis, +bilateral LE dependent edema  MS- no significant deformity or atrophy Skin- warm and dry, no  rash or  lesion Psych- euthymic mood, full affect Neuro- strength and sensation are intact  Labs:  Lab Results  Component Value Date   WBC 7.1 06/18/2017   HGB 12.2 (L) 06/18/2017   HCT 36.0 (L) 06/18/2017   MCV 90.0 06/18/2017   PLT 148 (L) 06/18/2017    Recent Labs Lab 06/18/17 1946  06/19/17 0227  NA 134*  < > 137  K 5.1  < > 4.2  CL 103  < > 106  CO2 22  --  23  BUN 30*  < > 25*  CREATININE 1.55*  < > 1.35*  CALCIUM 9.4  --  9.3  PROT 6.3*  --   --   BILITOT 0.6  --   --   ALKPHOS 123  --   --   ALT 25  --   --   AST 27  --   --   GLUCOSE 326*  < > 203*  < > = values in this interval not displayed.    Radiology/Studies: Dg Chest 2 View Result Date: 06/18/2017 CLINICAL DATA:  Syncope after working in the yard today. EXAM: CHEST  2 VIEW COMPARISON:  Most recent comparison 06/06/2017 FINDINGS: Left-sided pacemaker with lead overlying the left ventricle. Stable heart size and mediastinal contours, there is a coronary stent versus calcification. Improved left basilar aeration with residual subsegmental atelectasis. Mild subsegmental atelectasis at the right lung base. Previous small pleural effusions have diminished and likely resolved. No pulmonary edema. No confluent consolidation or pneumothorax. Question of nondisplaced sternal fracture, age indeterminate. IMPRESSION: 1. Bibasilar atelectasis, with improved left lung base aeration from prior exam. 2. Question of nondisplaced sternal fracture, this is age indeterminate, may be subacute given reported history of CPR 3 weeks prior. Electronically Signed   By: Jeb Levering M.D.   On: 06/18/2017 20:35   CBS:WHQPR rhythm, rate 77 (personally reviewed)  TELEMETRY: sinus rhythm (personally reviewed)  DEVICE HISTORY: MDT single chamber ICD implanted 02/2017 for secondary prevention; RV lead revision 05/2017 for undersensing of VF  Assessment/Plan: 1.  VT storm The patient presented with VT storm and 13 appropriate ICD shocks all with  termination of VT.  Cycle length around 248msec. He has been compliant with amiodarone 200mg  daily at home.   We have discussed treatment options with patient. Will plan EPS/RFCA with scar modification today. Risks, benefits reviewed with patient who plans to proceed.  Continue IV amiodarone for at least 24 hours Will plan to discharge on amiodarone 400mg  bid x 1 week then 200mg  bid until seen by Dr Lovena Le in follow up (scheduled) Keep K>3.9, Mg>1.8 No driving x6 months  2.  Elevated troponin No recent ischemic symptoms Likely due to VT storm  3.  CAD No recent ischemic symptoms Continue medical therapy  4.  Persistent atrial fibrillation Maintaining SR Continue Xarelto for CHADS2VASC of 4  5.  HTN Stable No change required today   Signed, Chanetta Marshall, NP 06/19/2017 7:52 AM   EP Attending  Patient seen and examined. Agree with above. Exam demonstrates no decompensated CHF, and well healed device incision. The patient has presented with VT storm. No evidence of ischemia. I have discussed the risks/benefit/goals/expectations of VT ablation and he wishes to proceed. Mikle Bosworth.D.

## 2017-06-19 NOTE — Interval H&P Note (Signed)
History and Physical Interval Note:  06/19/2017 2:10 PM  Wayne [name]  has presented today for surgery, with the diagnosis of VT  The various methods of treatment have been discussed with the patient and family. After consideration of risks, benefits and other options for treatment, the patient has consented to  Procedure(s): V Tach Ablation (N/A) as a surgical intervention .  The patient's history has been reviewed, patient examined, no change in status, stable for surgery.  I have reviewed the patient's chart and labs.  Questions were answered to the patient's satisfaction.     [name]

## 2017-06-19 NOTE — Progress Notes (Signed)
  Amiodarone Drug - Drug Interaction Consult Note  Recommendations: Monitor QTc, vitals, K+ Amiodarone is metabolized by the cytochrome P450 system and therefore has the potential to cause many drug interactions. Amiodarone has an average plasma half-life of 50 days (range 20 to 100 days).   There is potential for drug interactions to occur several weeks or months after stopping treatment and the onset of drug interactions may be slow after initiating amiodarone.   [x]  Statins: Increased risk of myopathy. Simvastatin- restrict dose to 20mg  daily. Other statins: counsel patients to report any muscle pain or weakness immediately.  []  Anticoagulants: Amiodarone can increase anticoagulant effect. Consider warfarin dose reduction. Patients should be monitored closely and the dose of anticoagulant altered accordingly, remembering that amiodarone levels take several weeks to stabilize.  []  Antiepileptics: Amiodarone can increase plasma concentration of phenytoin, the dose should be reduced. Note that small changes in phenytoin dose can result in large changes in levels. Monitor patient and counsel on signs of toxicity.  [x]  Beta blockers: increased risk of bradycardia, AV block and myocardial depression. Sotalol - avoid concomitant use.  []   Calcium channel blockers (diltiazem and verapamil): increased risk of bradycardia, AV block and myocardial depression.  []   Cyclosporine: Amiodarone increases levels of cyclosporine. Reduced dose of cyclosporine is recommended.  [x]  Digoxin dose should be halved when amiodarone is started--dose is ok  [x]  Diuretics: increased risk of cardiotoxicity if hypokalemia occurs.  []  Oral hypoglycemic agents (glyburide, glipizide, glimepiride): increased risk of hypoglycemia. Patient's glucose levels should be monitored closely when initiating amiodarone therapy.   []  Drugs that prolong the QT interval:  Torsades de pointes risk may be increased with concurrent use -  avoid if possible.  Monitor QTc, also keep magnesium/potassium WNL if concurrent therapy can't be avoided. Marland Kitchen Antibiotics: e.g. fluoroquinolones, erythromycin. . Antiarrhythmics: e.g. quinidine, procainamide, disopyramide, sotalol. . Antipsychotics: e.g. phenothiazines, haloperidol.  . Lithium, tricyclic antidepressants, and methadone. Thank You,  Narda Bonds  06/19/2017 2:28 AM

## 2017-06-19 NOTE — H&P (View-Only) (Signed)
ELECTROPHYSIOLOGY CONSULT NOTE    Patient ID: Wayne [name] MRN: [id number], DOB/AGE: Jan 01, 1941 76 y.o.  Admit date: 06/18/2017 Date of Consult: 06/19/2017  Primary Physician: [name], MD Primary Cardiologist: Aundra Dubin Electrophysiologist: Lovena Le  Reason for Consultation: VT with ICD shocks   HPI:  Wayne [name] is a 76 y.o. male is referred by Dr Overton Mam for evaluation of VT.  Past medical history is significant for prior cardiac arrest s/p ICD implant, CAD, ICM, hypertension, chronic systolic heart failure, persistent atrial fibrillation and recurrent VT despite amiodarone. In march of 2018, he had a resuscitated cardiac arrest while wearing a LifeVest.  He was implanted with a MDT single chamber ICD. He then had recurrent VF 05/2017 with undersensing of his ICD and underwent lead revision.  Yesterday, he was out in his building when he began to have dizziness and pre-syncope. He tried to lower himself to the ground and passed out.  He awoke and then had at least 7 ICD shocks that he can remember. He called 911 who transported him to [facility].  He has been placed on IV amiodarone and monitored on telemetry without recurrent ventricular arrhythmias.   He continues to have chest soreness from CPR. He has not had recent worsening shortness of breath or angina. He reports compliance with medications. No LE edema, nausea or vomiting.  Last echo 05/2017 demonstrated EF 25-30%, inferior wall akinesis. Cardiac MRI 11/2016 with scr basal to mid inferior wall.   Past Medical History:  Diagnosis Date  . Adenocarcinoma of prostate ([place])    s/p seed implants  . Arthritis   . Cellulitis of left leg    a. 01/6833 complicated by septic shock  . Chronic combined systolic and diastolic CHF (congestive heart failure) ([place])   . Colon polyps   . CORONARY ATHEROSCLEROSIS NATIVE CORONARY ARTERY    a. 01/2011 Cath/PCI: LM nl, LAD 40-50p, D1 80-small, LCX 95-small, RI 90, RCA 100, EF 20%;  b. 01/2011 Card MRI -  No transmural scar;  c. 01/2011 PCI RCA->5 Promus DES, RI->3.0x16 Promus DES; d. Cath 01/13/13 patent LAD & Ramus, diffuse LCx dz, RCA mult overlapping stents w/ 95% osital stenosis, EF 20%, s/p DES to ostial/prox RCA 01/24/13   . Diabetes mellitus, type 2 ([place])   . GERD (gastroesophageal reflux disease)   . Hematoma of leg    a. left leg hematoma 03/2012 in the setting of asa/effient  . Herpes zoster ophthalmicus   . HYPERLIPIDEMIA    intolerant to Lipitor (myalgias)  . HYPERTENSION   . Ischemic cardiomyopathy    a. 01/2012 Echo EF 45%, mild LVH; b. HD62%, grade 1 diastolic dysfunction, diffuse hypokinesis, inferoposterior akinesis   . Noncompliance   . Obesity   . OSA (obstructive sleep apnea)   . Polymyalgia rheumatica ([place])      Surgical History:  Past Surgical History:  Procedure Laterality Date  . CARDIAC CATHETERIZATION  01/24/2013  . CARDIAC CATHETERIZATION N/A 11/14/2016   Procedure: Right/Left Heart Cath and Coronary Angiography;  Surgeon: Larey Dresser, MD;  Location: [place] [facility];  Service: Cardiovascular;  Laterality: N/A;  . CARDIAC CATHETERIZATION N/A 11/17/2016   Procedure: Coronary Stent Intervention w/Impella;  Surgeon: Peter M Martinique, MD;  Location: [place] [facility];  Service: Cardiovascular;  Laterality: N/A;  . CARDIAC CATHETERIZATION N/A 11/17/2016   Procedure: Coronary Atherectomy;  Surgeon: Peter M Martinique, MD;  Location: [place] [facility];  Service: Cardiovascular;  Laterality: N/A;  . CORONARY ANGIOPLASTY WITH  STENT PLACEMENT  01/24/2013   DES to RCA  . CORONARY STENT PLACEMENT  2012   reports 6 stents placed  . I&D EXTREMITY  06/15/2012   Procedure: IRRIGATION AND DEBRIDEMENT EXTREMITY;  Surgeon: Newt Minion, MD;  Location: [place];  Service: Orthopedics;  Laterality: Left;  I&D Left Posterior Knee  . I&D EXTREMITY  06/30/2012   Procedure: IRRIGATION AND DEBRIDEMENT EXTREMITY;  Surgeon: Newt Minion, MD;  Location: [place];  Service: Orthopedics;   Laterality: Left;  Left Leg Irrigation and Debridement and placement of Wound VAC and application of  A-cell  . I&D EXTREMITY  07/20/2012   Procedure: IRRIGATION AND DEBRIDEMENT EXTREMITY;  Surgeon: Newt Minion, MD;  Location: [place];  Service: Orthopedics;  Laterality: Left;  Irrigation and Debridement Left Leg and Place antibiotic beads   . ICD IMPLANT N/A 02/12/2017   Procedure: ICD Implant;  Surgeon: Evans Lance, MD;  Location: [place] [facility];  Service: Cardiovascular;  Laterality: N/A;  . LEAD REVISION/REPAIR N/A 05/26/2017   Procedure: Lead Revision/Repair;  Surgeon: Evans Lance, MD;  Location: [place] [facility];  Service: Cardiovascular;  Laterality: N/A;  . PERCUTANEOUS CORONARY STENT INTERVENTION (PCI-S) N/A 01/24/2013   Procedure: PERCUTANEOUS CORONARY STENT INTERVENTION (PCI-S);  Surgeon: Wellington Hampshire, MD;  Location: [facility] CATH LAB;  Service: Cardiovascular;  Laterality: N/A;  . PROSTATE SURGERY     cancer, seed implant  . ULTRASOUND GUIDANCE FOR VASCULAR ACCESS  11/14/2016   Procedure: Ultrasound Guidance For Vascular Access;  Surgeon: Larey Dresser, MD;  Location: [place] [facility];  Service: Cardiovascular;;     Prescriptions Prior to Admission  Medication Sig Dispense Refill Last Dose  . amiodarone (PACERONE) 200 MG tablet Take 1 tablet (200 mg total) by mouth 2 (two) times daily. (Patient taking differently: Take 200 mg by mouth daily. ) 60 tablet 3 06/18/2017 at am  . carvedilol (COREG) 12.5 MG tablet Take 1 tablet (12.5 mg total) by mouth 2 (two) times daily. 60 tablet 6 06/18/2017 at 0900  . digoxin (LANOXIN) 0.125 MG tablet Take 0.5 tablets (0.0625 mg total) by mouth daily. 15 tablet 6 06/18/2017 at am  . diphenhydramine-acetaminophen (TYLENOL PM) 25-500 MG TABS tablet Take 1 tablet by mouth at bedtime as needed (sleep).   PRN at PRN  . Evolocumab (REPATHA SURECLICK) 962 MG/ML SOAJ Inject 1 pen into the skin every 14 (fourteen) days.   06/13/2017 at am  . furosemide  (LASIX) 20 MG tablet Take 40 mg by mouth 2 (two) times daily.   6 06/18/2017 at am  . glimepiride (AMARYL) 2 MG tablet TAKE 1 TABLET(2 MG) BY MOUTH DAILY BEFORE BREAKFAST 90 tablet 1 06/18/2017 at am  . losartan (COZAAR) 25 MG tablet Take 0.5 tablets (12.5 mg total) by mouth daily. 15 tablet 3 06/18/2017 at am  . Multiple Vitamins-Minerals (CENTRUM SILVER ADULT 50+) TABS Take 1 tablet by mouth daily.   06/18/2017 at Unknown time  . Omega-3 Fatty Acids (FISH OIL) 1000 MG CAPS Take 1,000 mg by mouth 2 (two) times daily.    06/18/2017 at am  . pravastatin (PRAVACHOL) 20 MG tablet Take 1 tablet (20 mg total) by mouth every evening. 30 tablet 2 06/17/2017 at pm  . rivaroxaban (XARELTO) 20 MG TABS tablet Take 1 tablet (20 mg total) by mouth daily with supper. (Patient taking differently: Take 20 mg by mouth daily after breakfast. ) 30 tablet 6 06/18/2017 at 0900  . spironolactone (ALDACTONE) 25 MG  tablet Take 0.5 tablets (12.5 mg total) by mouth daily. 15 tablet 6 06/18/2017 at am  . traMADol (ULTRAM) 50 MG tablet Take 1 tablet (50 mg total) by mouth every 8 (eight) hours as needed for moderate pain. (Patient taking differently: Take 50 mg by mouth every 8 (eight) hours as needed (for pain). ) 20 tablet 0 PRN at PRN  . Lancets (ACCU-CHEK SOFT TOUCH) lancets Check blood sugars 1-2 times per day. DX: E11.65 200 each 5 UNK at [facility]    Inpatient Medications: . carvedilol  12.5 mg Oral BID WC  . digoxin  0.0625 mg Oral Daily  . furosemide  40 mg Oral BID  . insulin aspart  0-15 Units Subcutaneous TID WC  . insulin aspart  0-5 Units Subcutaneous QHS  . losartan  12.5 mg Oral Daily  . pravastatin  20 mg Oral QPM  . rivaroxaban  20 mg Oral QPC breakfast  . sodium chloride flush  3 mL Intravenous Q12H  . spironolactone  12.5 mg Oral Daily    Allergies:  Allergies  Allergen Reactions  . Crestor [Rosuvastatin Calcium] Other (See Comments)    Myalgia, Interfering with Gait  . Entresto [Sacubitril-Valsartan] Swelling and  Other (See Comments)    Angioedema  . Plavix [Clopidogrel] Itching, Swelling and Other (See Comments)    Nose bleeds and welts on legs & back  . Lipitor [Atorvastatin] Other (See Comments)    Makes legs sore  . Ancef [Cefazolin] Rash    Describes itching and rash, but said 'it wasn't that bad'    Social History   Social History  . Marital status: Married    Spouse name: N/A  . Number of children: 1  . Years of education: N/A   Occupational History  . 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 Smoking status: Former Smoker    Packs/day: 0.30    Years: 20.00    Types: Cigarettes    Quit date: 02/23/1989  . Smokeless tobacco: Never Used  . Alcohol use No  . Drug use: No  . Sexual activity: Not on file   Other Topics Concern  . Not on file   Social History Narrative   Retired Administrator     Family History  Problem Relation Age of Onset  . Cancer Mother 63       unknown CA  . Heart disease Father   . Alcohol abuse Father   . Heart attack Father 17     Review of Systems: All other systems reviewed and are otherwise negative except as noted above.  Physical Exam: Vitals:   06/19/17 0100 06/19/17 0200 06/19/17 0209 06/19/17 0339  BP: (!) 120/58 (!) 142/88  98/61  Pulse: 66 66  65  Resp: 15 13  15   Temp:    98.6 F (37 C)  TempSrc:    Oral  SpO2: 94% 99%  (!) 86%  Weight:   228 lb 6.3 oz (103.6 kg)     GEN- The patient is well appearing, alert and oriented x 3 today.   HEENT: normocephalic, atraumatic; sclera clear, conjunctiva pink; hearing intact; oropharynx clear; neck supple  Lungs- Clear to ausculation bilaterally, normal work of breathing.  No wheezes, rales, rhonchi Heart- Regular rate and rhythm, no murmurs, rubs or gallops  GI- soft, non-tender, non-distended, bowel sounds present  Extremities- no clubbing, cyanosis, +bilateral LE dependent edema  MS- no significant deformity or atrophy Skin- warm and dry, no  rash or  lesion Psych- euthymic mood, full affect Neuro- strength and sensation are intact  Labs:  Lab Results  Component Value Date   WBC 7.1 06/18/2017   HGB 12.2 (L) 06/18/2017   HCT 36.0 (L) 06/18/2017   MCV 90.0 06/18/2017   PLT 148 (L) 06/18/2017    Recent Labs Lab 06/18/17 1946  06/19/17 0227  NA 134*  < > 137  K 5.1  < > 4.2  CL 103  < > 106  CO2 22  --  23  BUN 30*  < > 25*  CREATININE 1.55*  < > 1.35*  CALCIUM 9.4  --  9.3  PROT 6.3*  --   --   BILITOT 0.6  --   --   ALKPHOS 123  --   --   ALT 25  --   --   AST 27  --   --   GLUCOSE 326*  < > 203*  < > = values in this interval not displayed.    Radiology/Studies: Dg Chest 2 View Result Date: 06/18/2017 CLINICAL DATA:  Syncope after working in the yard today. EXAM: CHEST  2 VIEW COMPARISON:  Most recent comparison 06/06/2017 FINDINGS: Left-sided pacemaker with lead overlying the left ventricle. Stable heart size and mediastinal contours, there is a coronary stent versus calcification. Improved left basilar aeration with residual subsegmental atelectasis. Mild subsegmental atelectasis at the right lung base. Previous small pleural effusions have diminished and likely resolved. No pulmonary edema. No confluent consolidation or pneumothorax. Question of nondisplaced sternal fracture, age indeterminate. IMPRESSION: 1. Bibasilar atelectasis, with improved left lung base aeration from prior exam. 2. Question of nondisplaced sternal fracture, this is age indeterminate, may be subacute given reported history of CPR 3 weeks prior. Electronically Signed   By: Jeb Levering M.D.   On: 06/18/2017 20:35   KGS:UPJSR rhythm, rate 77 (personally reviewed)  TELEMETRY: sinus rhythm (personally reviewed)  DEVICE HISTORY: MDT single chamber ICD implanted 02/2017 for secondary prevention; RV lead revision 05/2017 for undersensing of VF  Assessment/Plan: 1.  VT storm The patient presented with VT storm and 13 appropriate ICD shocks all with  termination of VT.  Cycle length around 238msec. He has been compliant with amiodarone 200mg  daily at home.   We have discussed treatment options with patient. Will plan EPS/RFCA with scar modification today. Risks, benefits reviewed with patient who plans to proceed.  Continue IV amiodarone for at least 24 hours Will plan to discharge on amiodarone 400mg  bid x 1 week then 200mg  bid until seen by Dr Lovena Le in follow up (scheduled) Keep K>3.9, Mg>1.8 No driving x6 months  2.  Elevated troponin No recent ischemic symptoms Likely due to VT storm  3.  CAD No recent ischemic symptoms Continue medical therapy  4.  Persistent atrial fibrillation Maintaining SR Continue Xarelto for CHADS2VASC of 4  5.  HTN Stable No change required today   Signed, Chanetta Marshall, NP 06/19/2017 7:52 AM   EP Attending  Patient seen and examined. Agree with above. Exam demonstrates no decompensated CHF, and well healed device incision. The patient has presented with VT storm. No evidence of ischemia. I have discussed the risks/benefit/goals/expectations of VT ablation and he wishes to proceed. Mikle Bosworth.D.

## 2017-06-19 NOTE — Progress Notes (Signed)
CRITICAL VALUE ALERT  Critical Value:  Troponin 0.22  Date & Time Notied: 06/19/17 at [place]  Provider Notified: Willey Blade  Orders Received/Actions taken:

## 2017-06-19 NOTE — H&P (Addendum)
CARDIOLOGY H&P  Primary Care Provider: [name], MD Primary Cardiologist: Aundra Dubin Primary Electrophysiologist:  Lovena Le  HPI: Wayne [name] is a 76 y.o. male was with PMHx of CAD, ICM, chronic CHF, ICD, persistent AF, HTN, HLD, and DM2 who presented to our facility following a syncopal episode.  Pt states that he was walking into his garage at around 1700 this evening to get his lawnmower when he began to feel lightheaded and dizzy.  He attempted to get to the grown to avoid passing out, however the next thing he recalled he was awoken to his ICD firing.  He states that he was intermittently conscious for the next few minutes, during which time he recalls about 7 shocks.  He eventually regained consciousness, and presented to the ED for further evaluation.  Pt states that he felt quite well prior to and following the event and denied CP, SOB, LE edema, worsening DOE, fevers, or chills.    He was [facility] month following a witnessed cardiac arrest.  It was determined that his implantable device was undersensing his arrhythmia, and thus pt underwent successful lead revision at that time.  He also was started on IV to oral amiodarone, with which he has been compliant since discharge, though he states that he has only been taking it once a day.  Just as in this case, he had no reported pre-arrest anginal symptoms and with a cath done in recent history, and hx of spontaneous VT was felt to be a primary arrhythmic event and no new ischemic w/u was pursued.   At the time of my exam, pt remained in SR without additional arrhythmias on telemetry.  His device was interrogated and revealed 14 events in the VF zone, which were successfully treated.     Review of Systems:     Cardiac Review of Systems: {Y] = yes [ ]  = no  Chest Pain [    ]  Resting SOB [   ] Exertional SOB  [  ]  Orthopnea [  ]   Pedal Edema [   ]    Palpitations [ Y  ] Syncope  [ Y  ]   Presyncope [    ]  General Review of Systems: [Y] = yes [  ]=no Constitional: recent weight change [  ]; anorexia [  ]; fatigue [  ]; nausea [  ]; night sweats [  ]; fever [  ]; or chills [  ];                                                                     Dental: poor dentition[  ];   Eye : blurred vision [  ]; diplopia [   ]; vision changes [  ];  Amaurosis fugax[  ]; Resp: cough [  ];  wheezing[  ];  hemoptysis[  ]; shortness of breath[  ]; paroxysmal nocturnal dyspnea[  ]; dyspnea on exertion[  ]; or orthopnea[  ];  GI:  gallstones[  ], vomiting[  ];  dysphagia[  ]; melena[  ];  hematochezia [  ]; heartburn[  ];   GU: kidney stones [  ]; hematuria[  ];   dysuria [  ];  nocturia[  ];               Skin: rash [  ], swelling[  ];, hair loss[  ];  peripheral edema[  ];  or itching[  ]; Musculosketetal: myalgias[  ];  joint swelling[  ];  joint erythema[  ];  joint pain[  ];  back pain[  ];  Heme/Lymph: bruising[  ];  bleeding[  ];  anemia[  ];  Neuro: TIA[  ];  headaches[  ];  stroke[  ];  vertigo[  ];  seizures[  ];   paresthesias[  ];  difficulty walking[  ];  Psych:depression[  ]; anxiety[  ];  Endocrine: diabetes[  ];  thyroid dysfunction[  ];  Other:  Past Medical History:  Diagnosis Date  . Adenocarcinoma of prostate ([place])    s/p seed implants  . Arthritis   . Cellulitis of left leg    a. 0/1749 complicated by septic shock  . Chronic combined systolic and diastolic CHF (congestive heart failure) ([place])   . Colon polyps   . CORONARY ATHEROSCLEROSIS NATIVE CORONARY ARTERY    a. 01/2011 Cath/PCI: LM nl, LAD 40-50p, D1 80-small, LCX 95-small, RI 90, RCA 100, EF 20%;  b. 01/2011 Card MRI - No transmural scar;  c. 01/2011 PCI RCA->5 Promus DES, RI->3.0x16 Promus DES; d. Cath 01/13/13 patent LAD & Ramus, diffuse LCx dz, RCA mult overlapping stents w/ 95% osital stenosis, EF 20%, s/p DES to ostial/prox RCA 01/24/13   . Diabetes mellitus, type 2 ([place])   . GERD (gastroesophageal reflux disease)   . Hematoma  of leg    a. left leg hematoma 03/2012 in the setting of asa/effient  . Herpes zoster ophthalmicus   . HYPERLIPIDEMIA    intolerant to Lipitor (myalgias)  . HYPERTENSION   . Ischemic cardiomyopathy    a. 01/2012 Echo EF 45%, mild LVH; b. SW96%, grade 1 diastolic dysfunction, diffuse hypokinesis, inferoposterior akinesis   . Noncompliance   . Obesity   . OSA (obstructive sleep apnea)   . Polymyalgia rheumatica (H[facility])     Allergies  Allergen Reactions  . Crestor [Rosuvastatin Calcium] Other (See Comments)    Myalgia, Interfering with Gait  . Entresto [Sacubitril-Valsartan] Swelling and Other (See Comments)    Angioedema  . Plavix [Clopidogrel] Itching, Swelling and Other (See Comments)    Nose bleeds and welts on legs & back  . Lipitor [Atorvastatin] Other (See Comments)    Makes legs sore  . Ancef [Cefazolin] Rash    Describes itching and rash, but said 'it wasn't that bad'    Social History   Social History  . Marital status: Married    Spouse name: N/A  . Number of children: 1  . Years of education: N/A   Occupational History  . 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 Smoking status: Former Smoker    Packs/day: 0.30    Years: 20.00    Types: Cigarettes    Quit date: 02/23/1989  . Smokeless tobacco: Never Used  . Alcohol use No  . Drug use: No  . Sexual activity: Not on file   Other Topics Concern  . Not on file   Social History Narrative   Retired Administrator    Family History  Problem Relation Age of Onset  . Cancer Mother 79       unknown CA  . Heart disease Father   . Alcohol abuse Father   .  Heart attack Father 64    PHYSICAL EXAM: Vitals:   06/18/17 2215 06/18/17 2230  BP:  (!) 118/57  Pulse: 69 69  Resp: 14 18  Temp:     General:  Well appearing. No respiratory difficulty HEENT: normal Neck: supple. no JVD. Carotids 2+ bilat; no bruits. No lymphadenopathy or thryomegaly appreciated. Cor: PMI nondisplaced.  Regular rate & rhythm. No rubs, gallops or murmurs.  AICD site c/d/i and without evidence of hematoma  Lungs: clear Abdomen: soft, nontender, nondistended. No hepatosplenomegaly. No bruits or masses. Good bowel sounds. Extremities: no cyanosis, clubbing, rash, edema Neuro: alert & oriented x 3, cranial nerves grossly intact. moves all 4 extremities w/o difficulty. Affect pleasant.  ECG: NSR, IVCD, QTc 452  Results for orders placed or performed during the hospital encounter of 06/18/17 (from the past 24 hour(s))  CBC with Differential/Platelet     Status: Abnormal   Collection Time: 06/18/17  7:46 PM  Result Value Ref Range   WBC 7.1 4.0 - 10.5 K/uL   RBC 4.22 4.22 - 5.81 MIL/uL   Hemoglobin 12.3 (L) 13.0 - 17.0 g/dL   HCT 38.0 (L) 39.0 - 52.0 %   MCV 90.0 78.0 - 100.0 fL   MCH 29.1 26.0 - 34.0 pg   MCHC 32.4 30.0 - 36.0 g/dL   RDW 13.9 11.5 - 15.5 %   Platelets 148 (L) 150 - 400 K/uL   Neutrophils Relative % 69 %   Neutro Abs 4.8 1.7 - 7.7 K/uL   Lymphocytes Relative 19 %   Lymphs Abs 1.4 0.7 - 4.0 K/uL   Monocytes Relative 7 %   Monocytes Absolute 0.5 0.1 - 1.0 K/uL   Eosinophils Relative 5 %   Eosinophils Absolute 0.4 0.0 - 0.7 K/uL   Basophils Relative 0 %   Basophils Absolute 0.0 0.0 - 0.1 K/uL  Comprehensive metabolic panel     Status: Abnormal   Collection Time: 06/18/17  7:46 PM  Result Value Ref Range   Sodium 134 (L) 135 - 145 mmol/L   Potassium 5.1 3.5 - 5.1 mmol/L   Chloride 103 101 - 111 mmol/L   CO2 22 22 - 32 mmol/L   Glucose, Bld 326 (H) 65 - 99 mg/dL   BUN 30 (H) 6 - 20 mg/dL   Creatinine, Ser 1.55 (H) 0.61 - 1.24 mg/dL   Calcium 9.4 8.9 - 10.3 mg/dL   Total Protein 6.3 (L) 6.5 - 8.1 g/dL   Albumin 3.6 3.5 - 5.0 g/dL   AST 27 15 - 41 U/L   ALT 25 17 - 63 U/L   Alkaline Phosphatase 123 38 - 126 U/L   Total Bilirubin 0.6 0.3 - 1.2 mg/dL   GFR calc non Af Amer 43 (L) >60 mL/min   GFR calc Af Amer 50 (L) >60 mL/min   Anion gap 9 5 - 15  I-stat chem 8,  ed     Status: Abnormal   Collection Time: 06/18/17  8:05 PM  Result Value Ref Range   Sodium 138 135 - 145 mmol/L   Potassium 5.1 3.5 - 5.1 mmol/L   Chloride 103 101 - 111 mmol/L   BUN 33 (H) 6 - 20 mg/dL   Creatinine, Ser 1.50 (H) 0.61 - 1.24 mg/dL   Glucose, Bld 325 (H) 65 - 99 mg/dL   Calcium, Ion 1.07 (L) 1.15 - 1.40 mmol/L   TCO2 25 0 - 100 mmol/L   Hemoglobin 12.2 (L) 13.0 - 17.0 g/dL   HCT  36.0 (L) 39.0 - 52.0 %  I-stat troponin, ED     Status: None   Collection Time: 06/18/17  8:15 PM  Result Value Ref Range   Troponin i, poc 0.08 0.00 - 0.08 ng/mL   Comment 3           Dg Chest 2 View  Result Date: 06/18/2017 CLINICAL DATA:  Syncope after working in the yard today. EXAM: CHEST  2 VIEW COMPARISON:  Most recent comparison 06/06/2017 FINDINGS: Left-sided pacemaker with lead overlying the left ventricle. Stable heart size and mediastinal contours, there is a coronary stent versus calcification. Improved left basilar aeration with residual subsegmental atelectasis. Mild subsegmental atelectasis at the right lung base. Previous small pleural effusions have diminished and likely resolved. No pulmonary edema. No confluent consolidation or pneumothorax. Question of nondisplaced sternal fracture, age indeterminate. IMPRESSION: 1. Bibasilar atelectasis, with improved left lung base aeration from prior exam. 2. Question of nondisplaced sternal fracture, this is age indeterminate, may be subacute given reported history of CPR 3 weeks prior. Electronically Signed   By: Jeb Levering M.D.   On: 06/18/2017 20:35   Ct Head Wo Contrast  Result Date: 06/18/2017 CLINICAL DATA:  Syncopal episode. EXAM: CT HEAD WITHOUT CONTRAST CT CERVICAL SPINE WITHOUT CONTRAST TECHNIQUE: Multidetector CT imaging of the head and cervical spine was performed following the standard protocol without intravenous contrast. Multiplanar CT image reconstructions of the cervical spine were also generated. COMPARISON:  Head CT  05/22/2017 FINDINGS: CT HEAD FINDINGS Brain: There is no evidence for acute hemorrhage, hydrocephalus, mass lesion, or abnormal extra-axial fluid collection. No definite CT evidence for acute infarction. Diffuse loss of parenchymal volume is consistent with atrophy. Patchy low attenuation in the deep hemispheric and periventricular white matter is nonspecific, but likely reflects chronic microvascular ischemic demyelination. Vascular: Atherosclerotic calcification of the internal carotid arteries noted at the skull base. No dense MCA sign. Skull: No evidence for fracture. No worrisome lytic or sclerotic lesion. Sinuses/Orbits: The visualized paranasal sinuses and mastoid air cells are clear. Visualized portions of the globes and intraorbital fat are unremarkable. Other: None. CT CERVICAL SPINE FINDINGS Alignment: Trace retrolisthesis of C4 on 5 is compatible with the degree of disc degeneration with relative sparing of the facets. Straightening of normal cervical lordosis is evident. Skull base and vertebrae: No acute fracture. No primary bone lesion or focal pathologic process. Soft tissues and spinal canal: No prevertebral fluid or swelling. No visible canal hematoma. Disc levels: Loss of disc height noted C4-5 and C5-6. Facet osteoarthritis is most prominent at C6-7 in C7-T1 bilaterally. Upper chest: Unremarkable. Other: None. IMPRESSION: 1. No acute intracranial abnormality. Atrophy with chronic small vessel white matter ischemic disease. 2. Degenerative changes in the cervical spine without evidence for an acute fracture. 3. Loss of cervical lordosis. This can be related to patient positioning, muscle spasm or soft tissue injury. Electronically Signed   By: Misty Stanley M.D.   On: 06/18/2017 20:25   Ct Cervical Spine Wo Contrast  Result Date: 06/18/2017 CLINICAL DATA:  Syncopal episode. EXAM: CT HEAD WITHOUT CONTRAST CT CERVICAL SPINE WITHOUT CONTRAST TECHNIQUE: Multidetector CT imaging of the head and  cervical spine was performed following the standard protocol without intravenous contrast. Multiplanar CT image reconstructions of the cervical spine were also generated. COMPARISON:  Head CT 05/22/2017 FINDINGS: CT HEAD FINDINGS Brain: There is no evidence for acute hemorrhage, hydrocephalus, mass lesion, or abnormal extra-axial fluid collection. No definite CT evidence for acute infarction. Diffuse loss of parenchymal volume  is consistent with atrophy. Patchy low attenuation in the deep hemispheric and periventricular white matter is nonspecific, but likely reflects chronic microvascular ischemic demyelination. Vascular: Atherosclerotic calcification of the internal carotid arteries noted at the skull base. No dense MCA sign. Skull: No evidence for fracture. No worrisome lytic or sclerotic lesion. Sinuses/Orbits: The visualized paranasal sinuses and mastoid air cells are clear. Visualized portions of the globes and intraorbital fat are unremarkable. Other: None. CT CERVICAL SPINE FINDINGS Alignment: Trace retrolisthesis of C4 on 5 is compatible with the degree of disc degeneration with relative sparing of the facets. Straightening of normal cervical lordosis is evident. Skull base and vertebrae: No acute fracture. No primary bone lesion or focal pathologic process. Soft tissues and spinal canal: No prevertebral fluid or swelling. No visible canal hematoma. Disc levels: Loss of disc height noted C4-5 and C5-6. Facet osteoarthritis is most prominent at C6-7 in C7-T1 bilaterally. Upper chest: Unremarkable. Other: None. IMPRESSION: 1. No acute intracranial abnormality. Atrophy with chronic small vessel white matter ischemic disease. 2. Degenerative changes in the cervical spine without evidence for an acute fracture. 3. Loss of cervical lordosis. This can be related to patient positioning, muscle spasm or soft tissue injury. Electronically Signed   By: Misty Stanley M.D.   On: 06/18/2017 20:25   CMR 09/2016:  IMPRESSION: 1. Severely dilated left ventricle with EF 11%. Wall motion abnormalities as noted above. 2. Moderately dilated right ventricle with moderate systolic dysfunction. 3.  Moderate mitral regurgitation. 4. >50% thickness subendocardial LGE in the basal to mid inferior wall. These wall segments would be unlikely to show significant improvement with revascularization.   ASSESSMENT: 76 yo male with a pmhx of CAD, ICM, chronic systolic heart failure, prior VT arrest on admiodarone, persistent afib on xarelto, HTN, and HLD who presents following a syncopal event due to ventricular tachycardia, which was successfully treated via his AICD. He denies anginal symptoms, potassium is >4, and he does not have evidence of ischemia on his ECG.   As previously noted, suspect that this is a primary arrhythmic event. Given his inferior scar noted on CMR in 2017, this may be scar-mediated VT.  PLAN/DISCUSSION: 1. Ventricular tachycardia s/p AICD discharge -He has been electrically stable for almost 8 hours now, thus will admit to stepdown with a low threshold to transfer to ICU she he experience increasing ectopy or VT.  -Will load with 150mg  of amidarone and start on IV infusion this evening.   -If he continues to have VT, will start on lidocaine infusion -Continue home coreg -Follow mg and K, goal >2 and 4-5 respectively.   -Trend troponin -EP consult in AM: mexiletine vs ?ablation candidate should he fail medical therapy.  -Device pocket looks good and device interrogated in the ED with results in his chart  2. HFrEF  -appears euvolemic -Continue PTA lasix, coreg, losartan, digoxin, and aldactone -Check dig level in AM  3. AF -Continue xarelto  4. CAD -Has been >6 months since last PCI, thus he is not on brilinta -Continue BB and statin  5. DM2 -Hold amaryl, cover with SSI and acchecks  Regino Bellow, DO 12:40 AM

## 2017-06-19 NOTE — ED Notes (Signed)
Attempted report x1.  Name and callback number provided.   

## 2017-06-19 NOTE — ED Notes (Signed)
Attempted report x2.  The nurse receiving this patient is with and transporting a critical patient at this time and will call back when she returns to 4N.  Name and callback number provided.

## 2017-06-19 NOTE — Progress Notes (Signed)
Site area: 3 rt fv sheaths and 1 fa sheath Site Prior to Removal:  Level 0 Pressure Applied For: 25 minutes Manual:   yes Patient Status During Pull:  stable Post Pull Site:  Level 0 Post Pull Instructions Given:  yes Post Pull Pulses Present: palpable Dressing Applied:  Gauze and tegaderm Bedrest begins @ 9604 Comments: IV saline locked

## 2017-06-20 DIAGNOSIS — I480 Paroxysmal atrial fibrillation: Secondary | ICD-10-CM

## 2017-06-20 LAB — BASIC METABOLIC PANEL
ANION GAP: 12 (ref 5–15)
BUN: 22 mg/dL — ABNORMAL HIGH (ref 6–20)
CALCIUM: 8.4 mg/dL — AB (ref 8.9–10.3)
CO2: 18 mmol/L — ABNORMAL LOW (ref 22–32)
Chloride: 108 mmol/L (ref 101–111)
Creatinine, Ser: 1.55 mg/dL — ABNORMAL HIGH (ref 0.61–1.24)
GFR, EST AFRICAN AMERICAN: 50 mL/min — AB (ref >60)
GFR, EST NON AFRICAN AMERICAN: 43 mL/min — AB (ref >60)
Glucose, Bld: 197 mg/dL — ABNORMAL HIGH (ref 65–99)
Potassium: 4.4 mmol/L (ref 3.5–5.1)
SODIUM: 138 mmol/L (ref 135–145)

## 2017-06-20 LAB — GLUCOSE, CAPILLARY
GLUCOSE-CAPILLARY: 194 mg/dL — AB (ref 65–99)
Glucose-Capillary: 224 mg/dL — ABNORMAL HIGH (ref 65–99)
Glucose-Capillary: 348 mg/dL — ABNORMAL HIGH (ref 65–99)
Glucose-Capillary: 177 mg/dL — ABNORMAL HIGH (ref 65–99)

## 2017-06-20 MED ORDER — RIVAROXABAN 20 MG PO TABS
20.0000 mg | ORAL_TABLET | Freq: Every day | ORAL | Status: DC
  Administered 2017-06-20: 20 mg via ORAL
  Filled 2017-06-20: qty 1

## 2017-06-20 MED ORDER — AMIODARONE HCL 200 MG PO TABS
400.0000 mg | ORAL_TABLET | Freq: Two times a day (BID) | ORAL | Status: DC
Start: 2017-06-20 — End: 2017-06-21
  Administered 2017-06-20 – 2017-06-21 (×3): 400 mg via ORAL
  Filled 2017-06-20 (×3): qty 2

## 2017-06-20 NOTE — Progress Notes (Signed)
Patient ID: Wayne [name], male   DOB: 1941/05/06, 76 y.o.   MRN: [id number]     Advanced Heart Failure Rounding Note   Primary Cardiologist: Aundra Dubin  Subjective:    Admitted with VT storm and multiple ICD discharges, thought to be scar mediated.  Now status post VT ablation on 7/6 with scar modification.  He remains on amiodarone gtt, had 1 run 5 beats NSVT yesterday.    Objective:   Weight Range: 228 lb 9.6 oz (103.7 kg) Body mass index is 32.8 kg/m.   Vital Signs:   Temp:  [98.1 F (36.7 C)-98.3 F (36.8 C)] 98.1 F (36.7 C) (07/07 0832) Pulse Rate:  [48-124] 63 (07/07 0832) Resp:  [0-26] 19 (07/07 0832) BP: (74-127)/(37-63) 94/49 (07/07 0832) SpO2:  [85 %-99 %] 95 % (07/07 0832) Weight:  [228 lb 9.6 oz (103.7 kg)] 228 lb 9.6 oz (103.7 kg) (07/07 0338) Last BM Date: 06/19/17  Weight change: Filed Weights   06/19/17 0209 06/20/17 0338  Weight: 228 lb 6.3 oz (103.6 kg) 228 lb 9.6 oz (103.7 kg)    Intake/Output:   Intake/Output Summary (Last 24 hours) at 06/20/17 0953 Last data filed at 06/20/17 0600  Gross per 24 hour  Intake           374.34 ml  Output              125 ml  Net           249.34 ml      Physical Exam    General:  Well appearing. No resp difficulty HEENT: Normal Neck: Supple. JVP 7. Carotids 2+ bilat; no bruits. No lymphadenopathy or thyromegaly appreciated. Cor: PMI nondisplaced. Regular rate & rhythm. No rubs, gallops or murmurs. Lungs: Clear Abdomen: Soft, nontender, nondistended. No hepatosplenomegaly. No bruits or masses. Good bowel sounds. Extremities: No cyanosis, clubbing, rash, edema Neuro: Alert & orientedx3, cranial nerves grossly intact. moves all 4 extremities w/o difficulty. Affect pleasant   Telemetry   NSR, 1 five beat run NSVT yesterday. Personally reviewed.    Labs    CBC  Recent Labs  06/18/17 1946 06/18/17 2005  WBC 7.1  --   NEUTROABS 4.8  --   HGB 12.3* 12.2*  HCT 38.0* 36.0*  MCV 90.0  --   PLT 148*  --      Basic Metabolic Panel  Recent Labs  06/19/17 0227 06/20/17 0404  NA 137 138  K 4.2 4.4  CL 106 108  CO2 23 18*  GLUCOSE 203* 197*  BUN 25* 22*  CREATININE 1.35* 1.55*  CALCIUM 9.3 8.4*  MG 2.2  --    Liver Function Tests  Recent Labs  06/18/17 1946  AST 27  ALT 25  ALKPHOS 123  BILITOT 0.6  PROT 6.3*  ALBUMIN 3.6   No results for input(s): LIPASE, AMYLASE in the last 72 hours. Cardiac Enzymes  Recent Labs  06/19/17 0227 06/19/17 0723  TROPONINI 0.22* 0.13*    BNP: BNP (last 3 results)  Recent Labs  11/17/16 0210 01/16/17 1318 06/10/17 0955  BNP 2,032.0* 363.7* 298.1*    ProBNP (last 3 results) No results for input(s): PROBNP in the last 8760 hours.   D-Dimer No results for input(s): DDIMER in the last 72 hours. Hemoglobin A1C No results for input(s): HGBA1C in the last 72 hours. Fasting Lipid Panel No results for input(s): CHOL, HDL, LDLCALC, TRIG, CHOLHDL, LDLDIRECT in the last 72 hours. Thyroid Function Tests No results for input(s): TSH, T4TOTAL,  T3FREE, THYROIDAB in the last 72 hours.  Invalid input(s): FREET3  Other results:   Imaging     No results found.   Medications:     Scheduled Medications: . carvedilol  12.5 mg Oral BID WC  . digoxin  0.0625 mg Oral Daily  . furosemide  40 mg Oral BID  . insulin aspart  0-15 Units Subcutaneous TID WC  . insulin aspart  0-5 Units Subcutaneous QHS  . losartan  12.5 mg Oral Daily  . pravastatin  20 mg Oral QPM  . sodium chloride flush  3 mL Intravenous Q12H  . sodium chloride flush  3 mL Intravenous Q12H  . spironolactone  12.5 mg Oral Daily     Infusions: . sodium chloride 250 mL (06/19/17 0230)  . sodium chloride    . amiodarone 30 mg/hr (06/20/17 0635)     PRN Medications:  sodium chloride, sodium chloride, diphenhydrAMINE **AND** acetaminophen, acetaminophen, amiodarone, ondansetron (ZOFRAN) IV, sodium chloride flush, sodium chloride flush, traMADol    Patient  Profile   76 yo with history of CAD, ischemic cardiomyopathy/chronic systolic CHF, and prior VT admitted with VT storm.   Assessment/Plan   1. VT: Admitted with VT storm.  Has history of VT.  Medtronic ICD.  Had VT ablation this admission.  Rhythm quiescent overnight. He was stable symptomatically prior to VT, no chest pain or worsening dyspnea.  Doubt ACS or CHF exacerbation, think this was scar-mediated.  - Can transition to amiodarone 400 mg bid today.  2. Chronic systolic CHF: Ischemic cardiomyopathy, last echo in 3/18 with EF 20-25%.  On exam, he is now volume overloaded. Doubt CHF exacerbation.  - Continue home Lasix.  - Continue digoxin, level ok.  - Continue home dose Coreg.  - With soft BP, he is on lower doses of losartan and spironolactone.  If BP higher tomorrow, would discharge him on his home doses of losartan 25 mg bid and spironolactone 25 mg daily.  3. CAD: No chest pain.  TnI to 0.22 maximum with multiple ICD shocks.  Doubt ACS. He has not been on ASA as he was on Xarelto at home.  - Resume home Xarelto today.  4. Atrial fibrillation: Paroxysmal.  In NSR here.  Restart Xarelto as above.   Possible home tomorrow.  Length of Stay: 2   Loralie Champagne, MD  06/20/2017, 9:53 AM  Advanced Heart Failure Team Pager [phone] (M-F; 7a - 4p)  Please contact [facility] for night-coverage after hours (4p -7a ) and weekends on [email or web address]

## 2017-06-20 NOTE — Progress Notes (Signed)
Pt is up to the bedside commode, no distress, denies pain.

## 2017-06-21 ENCOUNTER — Encounter (HOSPITAL_COMMUNITY): Payer: Self-pay | Admitting: *Deleted

## 2017-06-21 LAB — GLUCOSE, CAPILLARY
Glucose-Capillary: 205 mg/dL — ABNORMAL HIGH (ref 65–99)
Glucose-Capillary: 263 mg/dL — ABNORMAL HIGH (ref 65–99)

## 2017-06-21 LAB — BASIC METABOLIC PANEL
Anion gap: 8 (ref 5–15)
BUN: 20 mg/dL (ref 6–20)
CHLORIDE: 103 mmol/L (ref 101–111)
CO2: 24 mmol/L (ref 22–32)
CREATININE: 1.33 mg/dL — AB (ref 0.61–1.24)
Calcium: 8.8 mg/dL — ABNORMAL LOW (ref 8.9–10.3)
GFR calc Af Amer: 60 mL/min — ABNORMAL LOW (ref >60)
GFR calc non Af Amer: 51 mL/min — ABNORMAL LOW (ref >60)
GLUCOSE: 188 mg/dL — AB (ref 65–99)
POTASSIUM: 3.7 mmol/L (ref 3.5–5.1)
SODIUM: 135 mmol/L (ref 135–145)

## 2017-06-21 LAB — HEMOGLOBIN A1C
Hgb A1c MFr Bld: 9 % — ABNORMAL HIGH (ref 4.8–5.6)
Mean Plasma Glucose: 212 mg/dL

## 2017-06-21 LAB — CBC
HEMATOCRIT: 36.3 % — AB (ref 39.0–52.0)
Hemoglobin: 11.4 g/dL — ABNORMAL LOW (ref 13.0–17.0)
MCH: 27.9 pg (ref 26.0–34.0)
MCHC: 31.4 g/dL (ref 30.0–36.0)
MCV: 88.8 fL (ref 78.0–100.0)
Platelets: 134 10*3/uL — ABNORMAL LOW (ref 150–400)
RBC: 4.09 MIL/uL — ABNORMAL LOW (ref 4.22–5.81)
RDW: 13.9 % (ref 11.5–15.5)
WBC: 6.1 10*3/uL (ref 4.0–10.5)

## 2017-06-21 MED ORDER — AMIODARONE HCL 200 MG PO TABS: ORAL_TABLET | ORAL | 3 refills | Status: DC

## 2017-06-21 NOTE — Discharge Summary (Signed)
Discharge Summary    Patient ID: Wayne [name],  MRN: [id number], DOB/AGE: 74/28/1942 74 y.o.  Admit date: 06/18/2017 Discharge date: 06/21/2017  Primary Care Provider: [name] Primary Cardiologist: Dr. Aundra Dubin Primary Electrophysiologist: Dr. Lovena Le  Discharge Diagnoses    Principal Problem:   Ventricular tachycardia [facility]) Active Problems:   Essential hypertension   CORONARY ATHEROSCLEROSIS NATIVE CORONARY ARTERY   Systolic CHF, chronic (HCC)   CAD (coronary artery disease)   Type 2 diabetes mellitus, uncontrolled (HCC)   CKD (chronic kidney disease) stage 3, GFR 30-59 ml/min   Defibrillator discharge   Paroxysmal atrial fibrillation ([place])    History of Present Illness     Wayne [name] is a 76 y.o. male with past medical history of CAD (s/p PCI to proximal LAD with known occlusion of RCA), ICM (s/p ICD placement), chronic combined systolic and diastolic CHF (EF 02-11% by echo in 05/2017), PAF (on Xarelto), HTN, HLD, and Type 2 DM who presented to [facility] ED on 06/18/2017 for evaluation of a syncopal event.   The patient reported walking into his garage at around 1700 the evening of his presentation when he began to feel lightheaded and dizzy.  He attempted to get to the ground to avoid passing out, however the next thing he recalled he was awoken to his ICD firing. He states that he was intermittently conscious for the next few minutes, during which time he recalls about 7 shocks. He eventually regained consciousness, and presented to the ED for further evaluation. Pt states that he felt quite well prior to and following the event and denied CP, SOB, LE edema, worsening DOE, fevers, or chills.    Upon arrival to the ED, he was loaded with IV Amiodarone with plans to start Lidocaine if he had recurrent VT.   Hospital Course     Consultants: Electrophysiology  He was evaluated by EP the following morning. Device interrogation showed he was in VT storm, having  received 13 ICD shocks for termination of VT. He reported compliance with his PTA Amiodarone 200mg  daily.   He underwent an EP study on 7/6 with catheter ablation of the ventricular tachycardia. Following ablation, there was no inducible sustained VT. He was restarted on IV Amiodarone following the procedure.   The following morning, he reported overall doing well. Had 1 run of 5 beats NSVT overnight. On 06/21/2017, telemetry was reviewed and showed no significant abnormalities. He was last examined by Dr. Curt Bears and deemed stable for discharge. He Wayne [name] continue on his home PTA medications except for dose adjustment of Amiodarone as he Wayne [name] be on 400mg  BID for 2 weeks then 200mg  BID for 2 weeks, then 200mg  daily. Follow-Up with Dr. Lovena Le has been arranged. He was discharged home in stable condition.   _____________  Discharge Physical Exam and Vitals Blood pressure (!) 114/50, pulse 69, temperature 98.5 F (36.9 C), temperature source Oral, resp. rate 15, height 5\' 8"  (1.727 m), weight 227 lb 14.4 oz (103.4 kg), SpO2 93 %.  Filed Weights   06/20/17 0338 06/20/17 1127 06/21/17 0525  Weight: 228 lb 9.6 oz (103.7 kg) 227 lb (103 kg) 227 lb 14.4 oz (103.4 kg)   General: Pleasant male appearing in NAD Psych: Normal affect. Neuro: Alert and oriented X 3. Moves all extremities spontaneously. HEENT: Normal  Neck: Supple without bruits or JVD. Lungs:  Resp regular and unlabored, CTA. Heart: RRR no s3, s4, or murmurs. Abdomen: Soft, non-tender, non-distended, BS + x  4.  Extremities: No clubbing, cyanosis or edema. DP/PT/Radials 2+ and equal bilaterally.   Labs & Radiologic Studies     CBC  Recent Labs  06/18/17 1946 06/18/17 2005 06/21/17 0220  WBC 7.1  --  6.1  NEUTROABS 4.8  --   --   HGB 12.3* 12.2* 11.4*  HCT 38.0* 36.0* 36.3*  MCV 90.0  --  88.8  PLT 148*  --  315*   Basic Metabolic Panel  Recent Labs  06/19/17 0227 06/20/17 0404 06/21/17 0220  NA 137 138 135  K 4.2 4.4  3.7  CL 106 108 103  CO2 23 18* 24  GLUCOSE 203* 197* 188*  BUN 25* 22* 20  CREATININE 1.35* 1.55* 1.33*  CALCIUM 9.3 8.4* 8.8*  MG 2.2  --   --    Liver Function Tests  Recent Labs  06/18/17 1946  AST 27  ALT 25  ALKPHOS 123  BILITOT 0.6  PROT 6.3*  ALBUMIN 3.6   No results for input(s): LIPASE, AMYLASE in the last 72 hours. Cardiac Enzymes  Recent Labs  06/19/17 0227 06/19/17 0723  TROPONINI 0.22* 0.13*   BNP Invalid input(s): POCBNP D-Dimer No results for input(s): DDIMER in the last 72 hours. Hemoglobin A1C  Recent Labs  06/20/17 1007  HGBA1C 9.0*   Fasting Lipid Panel No results for input(s): CHOL, HDL, LDLCALC, TRIG, CHOLHDL, LDLDIRECT in the last 72 hours. Thyroid Function Tests No results for input(s): TSH, T4TOTAL, T3FREE, THYROIDAB in the last 72 hours.  Invalid input(s): FREET3  Dg Chest 2 View  Result Date: 06/18/2017 CLINICAL DATA:  Syncope after working in the yard today. EXAM: CHEST  2 VIEW COMPARISON:  Most recent comparison 06/06/2017 FINDINGS: Left-sided pacemaker with lead overlying the left ventricle. Stable heart size and mediastinal contours, there is a coronary stent versus calcification. Improved left basilar aeration with residual subsegmental atelectasis. Mild subsegmental atelectasis at the right lung base. Previous small pleural effusions have diminished and likely resolved. No pulmonary edema. No confluent consolidation or pneumothorax. Question of nondisplaced sternal fracture, age indeterminate. IMPRESSION: 1. Bibasilar atelectasis, with improved left lung base aeration from prior exam. 2. Question of nondisplaced sternal fracture, this is age indeterminate, may be subacute given reported history of CPR 3 weeks prior. Electronically Signed   By: Jeb Levering M.D.   On: 06/18/2017 20:35   Dg Chest 2 View  Result Date: 05/27/2017 CLINICAL DATA:  Status post defibrillator placement EXAM: CHEST  2 VIEW COMPARISON:  05/25/2017  FINDINGS: Defibrillator is again identified and stable. No pneumothorax is noted. Persistent left basilar atelectasis is noted. Small bilateral effusions are seen. No other focal infiltrate is noted. No bony abnormality is seen. Cardiac shadow is within normal limits. IMPRESSION: Stable left basilar atelectatic changes. Small bilateral pleural effusions. Electronically Signed   By: Inez Catalina M.D.   On: 05/27/2017 08:03   Ct Head Wo Contrast  Result Date: 06/18/2017 CLINICAL DATA:  Syncopal episode. EXAM: CT HEAD WITHOUT CONTRAST CT CERVICAL SPINE WITHOUT CONTRAST TECHNIQUE: Multidetector CT imaging of the head and cervical spine was performed following the standard protocol without intravenous contrast. Multiplanar CT image reconstructions of the cervical spine were also generated. COMPARISON:  Head CT 05/22/2017 FINDINGS: CT HEAD FINDINGS Brain: There is no evidence for acute hemorrhage, hydrocephalus, mass lesion, or abnormal extra-axial fluid collection. No definite CT evidence for acute infarction. Diffuse loss of parenchymal volume is consistent with atrophy. Patchy low attenuation in the deep hemispheric and periventricular white matter is  nonspecific, but likely reflects chronic microvascular ischemic demyelination. Vascular: Atherosclerotic calcification of the internal carotid arteries noted at the skull base. No dense MCA sign. Skull: No evidence for fracture. No worrisome lytic or sclerotic lesion. Sinuses/Orbits: The visualized paranasal sinuses and mastoid air cells are clear. Visualized portions of the globes and intraorbital fat are unremarkable. Other: None. CT CERVICAL SPINE FINDINGS Alignment: Trace retrolisthesis of C4 on 5 is compatible with the degree of disc degeneration with relative sparing of the facets. Straightening of normal cervical lordosis is evident. Skull base and vertebrae: No acute fracture. No primary bone lesion or focal pathologic process. Soft tissues and spinal canal: No  prevertebral fluid or swelling. No visible canal hematoma. Disc levels: Loss of disc height noted C4-5 and C5-6. Facet osteoarthritis is most prominent at C6-7 in C7-T1 bilaterally. Upper chest: Unremarkable. Other: None. IMPRESSION: 1. No acute intracranial abnormality. Atrophy with chronic small vessel white matter ischemic disease. 2. Degenerative changes in the cervical spine without evidence for an acute fracture. 3. Loss of cervical lordosis. This can be related to patient positioning, muscle spasm or soft tissue injury. Electronically Signed   By: Misty Stanley M.D.   On: 06/18/2017 20:25   Ct Head Wo Contrast  Result Date: 05/22/2017 CLINICAL DATA:  Fall. EXAM: CT HEAD WITHOUT CONTRAST TECHNIQUE: Contiguous axial images were obtained from the base of the skull through the vertex without intravenous contrast. COMPARISON:  02/11/2017 head CT. FINDINGS: Brain: No evidence of parenchymal hemorrhage or extra-axial fluid collection. No mass lesion, mass effect, or midline shift. No CT evidence of acute infarction. Cerebral volume is age appropriate. No ventriculomegaly. Vascular: No hyperdense vessel or unexpected calcification. Skull: No evidence of calvarial fracture. Sinuses/Orbits: No fluid levels. Mucoperiosteal thickening in the bilateral ethmoidal air cells and sphenoid sinus. Other: Small right parietal scalp contusion. The mastoid air cells are unopacified. IMPRESSION: 1. Small right parietal scalp contusion. 2. No evidence of acute intracranial abnormality. No evidence of calvarial fracture. 3. Mild chronic appearing paranasal sinusitis. Electronically Signed   By: Ilona Sorrel M.D.   On: 05/22/2017 17:00   Ct Cervical Spine Wo Contrast  Result Date: 06/18/2017 CLINICAL DATA:  Syncopal episode. EXAM: CT HEAD WITHOUT CONTRAST CT CERVICAL SPINE WITHOUT CONTRAST TECHNIQUE: Multidetector CT imaging of the head and cervical spine was performed following the standard protocol without intravenous  contrast. Multiplanar CT image reconstructions of the cervical spine were also generated. COMPARISON:  Head CT 05/22/2017 FINDINGS: CT HEAD FINDINGS Brain: There is no evidence for acute hemorrhage, hydrocephalus, mass lesion, or abnormal extra-axial fluid collection. No definite CT evidence for acute infarction. Diffuse loss of parenchymal volume is consistent with atrophy. Patchy low attenuation in the deep hemispheric and periventricular white matter is nonspecific, but likely reflects chronic microvascular ischemic demyelination. Vascular: Atherosclerotic calcification of the internal carotid arteries noted at the skull base. No dense MCA sign. Skull: No evidence for fracture. No worrisome lytic or sclerotic lesion. Sinuses/Orbits: The visualized paranasal sinuses and mastoid air cells are clear. Visualized portions of the globes and intraorbital fat are unremarkable. Other: None. CT CERVICAL SPINE FINDINGS Alignment: Trace retrolisthesis of C4 on 5 is compatible with the degree of disc degeneration with relative sparing of the facets. Straightening of normal cervical lordosis is evident. Skull base and vertebrae: No acute fracture. No primary bone lesion or focal pathologic process. Soft tissues and spinal canal: No prevertebral fluid or swelling. No visible canal hematoma. Disc levels: Loss of disc height noted C4-5 and C5-6. Facet  osteoarthritis is most prominent at C6-7 in C7-T1 bilaterally. Upper chest: Unremarkable. Other: None. IMPRESSION: 1. No acute intracranial abnormality. Atrophy with chronic small vessel white matter ischemic disease. 2. Degenerative changes in the cervical spine without evidence for an acute fracture. 3. Loss of cervical lordosis. This can be related to patient positioning, muscle spasm or soft tissue injury. Electronically Signed   By: Misty Stanley M.D.   On: 06/18/2017 20:25   Dg Chest Port 1 View  Result Date: 05/25/2017 CLINICAL DATA:  Follow-up examination for acute  respiratory failure. EXAM: PORTABLE CHEST 1 VIEW COMPARISON:  Prior radiograph from 05/24/2017. FINDINGS: Stable cardiomegaly. Left-sided pacemaker/AICD again noted. Mediastinal silhouette unchanged. Lungs hypoinflated. Persistent left basilar opacity, either atelectasis or consolidation. Finding slightly improved. No other focal airspace disease. No pulmonary edema. Small residual left pleural effusion. No pneumothorax. No acute osseus abnormality. IMPRESSION: 1. Persistent but improved left basilar opacity, which may reflect improved atelectasis or infiltrate. Small residual left pleural effusion. 2. Otherwise stable appearance of the chest. Electronically Signed   By: Jeannine Boga M.D.   On: 05/25/2017 06:53   Dg Chest Port 1 View  Result Date: 05/24/2017 CLINICAL DATA:  Respiratory failure EXAM: PORTABLE CHEST 1 VIEW COMPARISON:  May 23, 2017 FINDINGS: Endotracheal tube and nasogastric tube have been removed. No pneumothorax. Pacemaker present with lead attached to right ventricle. There is persistent atelectasis/ consolidation in the left lower lobe with small left pleural effusion. Right lung is clear. There is stable cardiomegaly with pulmonary vascularity within normal limits. There is aortic atherosclerosis. No adenopathy. No bone lesions. IMPRESSION: Persistent left lower lobe atelectasis/ consolidation with small left pleural effusion. A degree of pneumonia in the left base cannot be excluded. No new opacity evident. Stable cardiac prominence. There is aortic atherosclerosis. Electronically Signed   By: Lowella Grip III M.D.   On: 05/24/2017 07:37   Dg Chest Port 1 View  Result Date: 05/23/2017 CLINICAL DATA:  Hypoxia EXAM: PORTABLE CHEST 1 VIEW COMPARISON:  May 22, 2017 FINDINGS: Endotracheal tube tip is 3.9 cm above the carina. Nasogastric tube tip and side port are below the diaphragm. Pacemaker lead is attached to the right ventricle. No pneumothorax. There is a small left pleural  effusion with left base atelectasis. Lungs elsewhere clear. Heart is mildly enlarged. The pulmonary vascular is normal. No adenopathy. There is aortic atherosclerosis. No bone lesions evident. IMPRESSION: Tube positions as described without pneumothorax. Stable cardiomegaly with aortic atherosclerosis. Small left pleural effusion with left base atelectasis. No frank consolidation or edema appreciable. Electronically Signed   By: Lowella Grip III M.D.   On: 05/23/2017 07:27   Dg Chest Portable 1 View  Result Date: 05/22/2017 CLINICAL DATA:  Intubation.  Trauma. EXAM: PORTABLE CHEST 1 VIEW COMPARISON:  02/23/2017. FINDINGS: Endotracheal tube noted with its tip at the carina. Proximal repositioning of approximately 3 cm suggested. AICD noted in stable position. Stable cardiomegaly. Mild pulmonary vascular congestion. Mild interstitial prominence. Mild component of CHF cannot be excluded. Small left pleural effusion. Low lung volumes. IMPRESSION: 1. Endotracheal tube noted with its tip at the carina pre and for repositioning of approximately 3 cm should be considered. 2. AICD in stable position. Mild changes of congestive heart failure with mild interstitial edema small left pleural effusion. Low lung volumes. Critical Value/emergent results were called by telephone at the time of interpretation on 05/22/2017 at 3:56 pm to nurse Claiborne Billings, who verbally acknowledged these results.a Psychologist, occupational   By: Marcello Moores  Register  On: 05/22/2017 15:58     Diagnostic Studies/Procedures     EP Study/ VT Ablation: 06/19/2017 Conclusion: Successful EP study and catheter ablation of ventricular tachycardia utilizing three-dimensional electro anatomic mapping. Following ablation, there was no inducible sustained ventricular tachycardia. The patient Wayne [name] be continued on intravenous amiodarone for approximately 12 more hours, followed by oral amiodarone at discharge.  Wayne [name], M.D.  Disposition   Pt is being  discharged home today in good condition.  Follow-up Plans & Appointments    Follow-up Information    Evans Lance, MD Follow up on 06/30/2017.   Specialty:  Cardiology Why:  at 10:30AM Contact information: [address] [place] [address] [phone]          Discharge Instructions    Diet - low sodium heart healthy    Complete by:  As directed       Discharge Medications     Medication List    TAKE these medications   accu-chek soft touch lancets Check blood sugars 1-2 times per day. DX: E11.65   amiodarone 200 MG tablet Commonly known as:  PACERONE Take 400mg  (2 tablets) twice daily for 2 weeks then 200mg  (one tablet) twice daily for 2 weeks, then 200mg  (one tablet) daily. What changed:  how much to take  how to take this  when to take this  additional instructions   carvedilol 12.5 MG tablet Commonly known as:  COREG Take 1 tablet (12.5 mg total) by mouth 2 (two) times daily.   CENTRUM SILVER ADULT 50+ Tabs Take 1 tablet by mouth daily.   digoxin 0.125 MG tablet Commonly known as:  LANOXIN Take 0.5 tablets (0.0625 mg total) by mouth daily.   diphenhydramine-acetaminophen 25-500 MG Tabs tablet Commonly known as:  TYLENOL PM Take 1 tablet by mouth at bedtime as needed (sleep).   Fish Oil 1000 MG Caps Take 1,000 mg by mouth 2 (two) times daily.   furosemide 20 MG tablet Commonly known as:  LASIX Take 40 mg by mouth 2 (two) times daily.   glimepiride 2 MG tablet Commonly known as:  AMARYL TAKE 1 TABLET(2 MG) BY MOUTH DAILY BEFORE BREAKFAST   losartan 25 MG tablet Commonly known as:  COZAAR Take 0.5 tablets (12.5 mg total) by mouth daily.   pravastatin 20 MG tablet Commonly known as:  PRAVACHOL Take 1 tablet (20 mg total) by mouth every evening.   REPATHA SURECLICK 509 MG/ML Soaj Generic drug:  Evolocumab Inject 1 pen into the skin every 14 (fourteen) days.   rivaroxaban 20 MG Tabs tablet Commonly known as:   XARELTO Take 1 tablet (20 mg total) by mouth daily with supper. What changed:  when to take this   spironolactone 25 MG tablet Commonly known as:  ALDACTONE Take 0.5 tablets (12.5 mg total) by mouth daily.   traMADol 50 MG tablet Commonly known as:  ULTRAM Take 1 tablet (50 mg total) by mouth every 8 (eight) hours as needed for moderate pain. What changed:  reasons to take this          Allergies Allergies  Allergen Reactions  . Crestor [Rosuvastatin Calcium] Other (See Comments)    Myalgia, Interfering with Gait  . Entresto [Sacubitril-Valsartan] Swelling and Other (See Comments)    Angioedema  . Plavix [Clopidogrel] Itching, Swelling and Other (See Comments)    Nose bleeds and welts on legs & back  . Lipitor [Atorvastatin] Other (See Comments)    Makes legs sore  . Ancef [  Cefazolin] Rash    Describes itching and rash, but said 'it wasn't that bad'     Outstanding Labs/Studies   Regularly Scheduled  Amiodarone Labs  Duration of Discharge Encounter   Greater than 30 minutes including physician time.  Signed, Erma Heritage, PA-C 06/21/2017, 11:47 AM  I have seen and examined this patient with Wayne [name].  Agree with above, note added to reflect my findings.  On exam, RRR, no murmurs, lungs clear. Had VT ablation with scar modification with Dr. Lovena Le. No tachycardia was able to be induced. Had VT storm prior to that with 13 ICD shocks. Patient was loaded on IV amiodarone and has since transitioned to PO. No further VT overnight. Plan to discharge today with follow up in clinic.    Kairie Vangieson M. Ebenezer Mccaskey MD 06/21/2017 12:09 PM

## 2017-06-22 ENCOUNTER — Encounter (HOSPITAL_COMMUNITY): Payer: Self-pay | Admitting: Internal Medicine

## 2017-06-23 ENCOUNTER — Encounter: Payer: Medicare Other | Admitting: Internal Medicine

## 2017-06-24 ENCOUNTER — Telehealth: Payer: Self-pay | Admitting: Student

## 2017-06-24 NOTE — Telephone Encounter (Signed)
Pt states for the last day or so he has experienced dizziness mostly when he stands up from sitting in a chair. Pt states his BP was 144/70, HR 69, pt sent in a transmission a short time ago, transmission okay per device clinic. Pt states his glucose was 300 shortly after drinking a glass of chocolate milk. Pt states he may not be drinking as much fluid as he should be drinking.   I reviewed with Dr Burt Knack- Per Dr Paris Lore pt to stay hydrated, take his time when changing position, monitor symptoms, no medication changes at this time,  keep appt already scheduled with Dr Lovena Le 06/30/17, call if symptoms worsen.  I discussed recommendations with pt, encouraged him to stay hydrated, water best option, follow-up with PCP about glucose. Pt feels comfortable with this plan. Pt states he drank a glass of water between our phone calls ,he states he already feels better.

## 2017-06-24 NOTE — Telephone Encounter (Signed)
New Message    Pt c/o medication issue:  1. Name of Medication:  amiodarone (PACERONE) 200 MG tablet Take 400mg  (2 tablets) twice daily for 2 weeks then 200mg  (one tablet) twice daily for 2 weeks, then 200mg  (one tablet) daily.     2. How are you currently taking this medication (dosage and times per day)?  2x twice a day  3. Are you having a reaction (difficulty breathing--STAT)?  no  4. What is your medication issue?  He is having a problem with dizziness when he stands , since they increased the medication to 2x tablets 2x a day 144/70 pulse 69

## 2017-06-29 ENCOUNTER — Ambulatory Visit (HOSPITAL_COMMUNITY)
Admission: RE | Admit: 2017-06-29 | Discharge: 2017-06-29 | Disposition: A | Discharge status: Home / Self Care | Payer: Medicare Other | Source: Ambulatory Visit | Attending: Cardiology | Admitting: Cardiology

## 2017-06-29 ENCOUNTER — Other Ambulatory Visit (HOSPITAL_COMMUNITY): Payer: Self-pay | Admitting: *Deleted

## 2017-06-29 ENCOUNTER — Encounter: Payer: Self-pay | Admitting: Cardiology

## 2017-06-29 ENCOUNTER — Telehealth: Payer: Self-pay

## 2017-06-29 ENCOUNTER — Other Ambulatory Visit (HOSPITAL_COMMUNITY): Payer: Medicare Other

## 2017-06-29 ENCOUNTER — Encounter (HOSPITAL_COMMUNITY): Payer: Self-pay | Admitting: *Deleted

## 2017-06-29 VITALS — Wt 228.0 lb

## 2017-06-29 VITALS — BP 92/48 | HR 56

## 2017-06-29 DIAGNOSIS — Z7901 Long term (current) use of anticoagulants: Secondary | ICD-10-CM | POA: Insufficient documentation

## 2017-06-29 DIAGNOSIS — K219 Gastro-esophageal reflux disease without esophagitis: Secondary | ICD-10-CM | POA: Insufficient documentation

## 2017-06-29 DIAGNOSIS — I255 Ischemic cardiomyopathy: Secondary | ICD-10-CM | POA: Diagnosis not present

## 2017-06-29 DIAGNOSIS — I350 Nonrheumatic aortic (valve) stenosis: Secondary | ICD-10-CM | POA: Insufficient documentation

## 2017-06-29 DIAGNOSIS — N183 Chronic kidney disease, stage 3 unspecified: Secondary | ICD-10-CM

## 2017-06-29 DIAGNOSIS — I462 Cardiac arrest due to underlying cardiac condition: Secondary | ICD-10-CM | POA: Insufficient documentation

## 2017-06-29 DIAGNOSIS — Z79899 Other long term (current) drug therapy: Secondary | ICD-10-CM | POA: Diagnosis not present

## 2017-06-29 DIAGNOSIS — I472 Ventricular tachycardia, unspecified: Secondary | ICD-10-CM

## 2017-06-29 DIAGNOSIS — Z87891 Personal history of nicotine dependence: Secondary | ICD-10-CM | POA: Diagnosis not present

## 2017-06-29 DIAGNOSIS — I4901 Ventricular fibrillation: Secondary | ICD-10-CM | POA: Insufficient documentation

## 2017-06-29 DIAGNOSIS — E669 Obesity, unspecified: Secondary | ICD-10-CM | POA: Diagnosis not present

## 2017-06-29 DIAGNOSIS — I110 Hypertensive heart disease with heart failure: Secondary | ICD-10-CM | POA: Insufficient documentation

## 2017-06-29 DIAGNOSIS — Z9889 Other specified postprocedural states: Secondary | ICD-10-CM | POA: Diagnosis not present

## 2017-06-29 DIAGNOSIS — E119 Type 2 diabetes mellitus without complications: Secondary | ICD-10-CM | POA: Diagnosis not present

## 2017-06-29 DIAGNOSIS — M353 Polymyalgia rheumatica: Secondary | ICD-10-CM | POA: Diagnosis not present

## 2017-06-29 DIAGNOSIS — I2582 Chronic total occlusion of coronary artery: Secondary | ICD-10-CM | POA: Diagnosis not present

## 2017-06-29 DIAGNOSIS — E785 Hyperlipidemia, unspecified: Secondary | ICD-10-CM | POA: Insufficient documentation

## 2017-06-29 DIAGNOSIS — I5022 Chronic systolic (congestive) heart failure: Secondary | ICD-10-CM

## 2017-06-29 DIAGNOSIS — I2510 Atherosclerotic heart disease of native coronary artery without angina pectoris: Secondary | ICD-10-CM | POA: Diagnosis not present

## 2017-06-29 DIAGNOSIS — E875 Hyperkalemia: Secondary | ICD-10-CM | POA: Insufficient documentation

## 2017-06-29 DIAGNOSIS — M1990 Unspecified osteoarthritis, unspecified site: Secondary | ICD-10-CM | POA: Diagnosis not present

## 2017-06-29 DIAGNOSIS — I480 Paroxysmal atrial fibrillation: Secondary | ICD-10-CM | POA: Insufficient documentation

## 2017-06-29 DIAGNOSIS — Z006 Encounter for examination for normal comparison and control in clinical research program: Secondary | ICD-10-CM

## 2017-06-29 DIAGNOSIS — I7389 Other specified peripheral vascular diseases: Secondary | ICD-10-CM | POA: Insufficient documentation

## 2017-06-29 DIAGNOSIS — Z95810 Presence of automatic (implantable) cardiac defibrillator: Secondary | ICD-10-CM | POA: Diagnosis not present

## 2017-06-29 DIAGNOSIS — Z7984 Long term (current) use of oral hypoglycemic drugs: Secondary | ICD-10-CM | POA: Insufficient documentation

## 2017-06-29 DIAGNOSIS — I959 Hypotension, unspecified: Secondary | ICD-10-CM

## 2017-06-29 DIAGNOSIS — G4733 Obstructive sleep apnea (adult) (pediatric): Secondary | ICD-10-CM | POA: Insufficient documentation

## 2017-06-29 LAB — CBC
HCT: 38 % — ABNORMAL LOW (ref 39.0–52.0)
HEMOGLOBIN: 12.2 g/dL — AB (ref 13.0–17.0)
MCH: 28.4 pg (ref 26.0–34.0)
MCHC: 32.1 g/dL (ref 30.0–36.0)
MCV: 88.6 fL (ref 78.0–100.0)
PLATELETS: 182 10*3/uL (ref 150–400)
RBC: 4.29 MIL/uL (ref 4.22–5.81)
RDW: 14 % (ref 11.5–15.5)
WBC: 6.1 10*3/uL (ref 4.0–10.5)

## 2017-06-29 LAB — BASIC METABOLIC PANEL
ANION GAP: 8 (ref 5–15)
BUN: 23 mg/dL — ABNORMAL HIGH (ref 6–20)
CALCIUM: 9 mg/dL (ref 8.9–10.3)
CO2: 26 mmol/L (ref 22–32)
CREATININE: 1.61 mg/dL — AB (ref 0.61–1.24)
Chloride: 99 mmol/L — ABNORMAL LOW (ref 101–111)
GFR calc Af Amer: 47 mL/min — ABNORMAL LOW (ref >60)
GFR, EST NON AFRICAN AMERICAN: 41 mL/min — AB (ref >60)
GLUCOSE: 323 mg/dL — AB (ref 65–99)
Potassium: 4.6 mmol/L (ref 3.5–5.1)
Sodium: 133 mmol/L — ABNORMAL LOW (ref 135–145)

## 2017-06-29 MED ORDER — FUROSEMIDE 20 MG PO TABS: 40.0000 mg | ORAL_TABLET | Freq: Every day | ORAL | 6 refills | Status: DC

## 2017-06-29 MED ORDER — CARVEDILOL 12.5 MG PO TABS: 6.2500 mg | ORAL_TABLET | Freq: Two times a day (BID) | ORAL | 6 refills | Status: DC

## 2017-06-29 NOTE — Progress Notes (Signed)
Patient ID: Wayne [name], male   DOB: 06/07/1941, 76 y.o.   MRN: [id number]   Advanced Heart Failure Clinic Note   Patient ID: Wayne [name], male   DOB: Aug 12, 1941, 76 y.o.   MRN: [id number] PCP: Dr. Elease Hashimoto Cardiology: Dr. Earline Mayotte Wayne [name] is a 76 y.o. male  with history of DM, HTN, CAD, and ischemic cardiomyopathy presents for cardiology followup. Patient had a CHF exacerbation in 2/12 and was found to have LV systolic dysfunction with EF around 20%. LHC showed RCA, ramus, and CFX disease. RCA was subtotally occluded. Cardiac MRI showed that all walls, including the inferior wall, should be viable. Patient therefore underwent opening of his chronic totally occluded RCA as well as PCI to the ramus in 2/12. He received drug eluting stents and was on Effient.  He had an echo in 2/13 that showed EF improved to 45% with moderate LV dilation and mild LV hypertrophy.   In 5/13, he developed a large left lower leg hematoma.  He was still on Effient at that time.  ASA and Effient were stopped.  The hematoma did not resolve.  In 7/13, he was re-admitted with septic shock from MSSA from an abscess in his left gastrocnemius.  He also grew Pseudomonas from the left gastrocnemius as well.  He had a prolonged course in the hospital and later in a rehab facility.  Ultimately, he got back home again.  I had him get an echo in 1/14, and this showed EF 15% with diffuse hypokinesis and inferoposterior akinesis.  He had been off of most of his prior cardiac medications.  I took him back for [facility] in 1/14.  This showed subtotal occlusion of a small AV LCx and 95% ostial in-stent restenosis in the RCA.  He was treated in 2/14 with a Xience DES to the ostial RCA and begun on Plavix.  Unfortunately, he developed diffuse hives after starting Plavix and had to be switched to ticlopidine.  He has tolerated ticlopidine.   Echo done in 12/14 showed some improvement in LV function but EF was still low at 30-35%.  He did not want ICD.     Presented to ED 11/14/15 with worsening SOB after a few steps and CXR with CHF and bilateral effusions in the setting of marked medical non-compliance. States he stopped taking his medicines in 01/2015 (except for ASA 81 and a multivitamin).  He was feeling good and decided that he did not need them anymore.  Also c/o URI symptoms. Diuresed over 2 L with IV diuretics in the ER and started on lasix 20 mg daily.   Echo (12/16) showed EF 20-25% with diffuse hypokinesis (down from 35% in 12/15).  Echo 10/17 showed EF 15% with mildly decreased RV systolic function.    He had left and right heart catheterization in 12/17. This showed elevated right and left heart filling pressures and low cardiac output. The RCA was totally occluded with collaterals, there was 95% ostial to mid LAD stenosis.  Patient had PCI with DES to proximal-mid LAD.    He was noted to be in atrial fibrillation with RVR in 2/18 at cardiac rehab. He felt fatigued.  He was admitted and started on amiodarone for rate control. ASA was stopped, ticagrelor was decreased to 60 mg bid, and Xarelto 15 mg daily was started.  He converted spontaneously back to NSR.    He suffered a ventricular fibrillation arrest in 3/18.  Luckily, he was wearing a Lifevest and  was shocked.  He was admitted and started on amiodarone.  Medtronic ICD was placed.  Echo 3/18 with EF 20-25%.   Admitted 6/8 -> 05/27/17 due to VF arrest. ICD lead had been undersensing leading to prolongation of time to discharge.  He underwent RV lead extraction with new lead implant. Was temporarily placed on IV amio. HF meds adjusted as tolerated.   He was admitted again with VT storm in 7/18.  No prior chest pain or symptoms of worsening CAD.  He had VT ablation this admission.   He presents today for post hospital followup.  He has chest soreness from CPR prior to last admission.  He can walk on flat ground without dyspnea, still mildly short of breath with stairs and inclines.  No  orthopnea/PND.  Weight stable at home.  He has had significant lightheadedness with standing since his last hospitalization.  He is even a little dizzy while sitting today.  Currently, SBP in 90s but was in 80s when checked earlier today at research.  No palpitations or ICD discharge.  No melena/BRBPR.    Optivol checked today: Fluid index below threshold, thoracic impedance stable. No further VT. No atrial fibrillation.    Labs (12/16): K 4.9, creatinine 1.23 Labs (1/17): K 4.7, creatinine 1.15, BNP 1433 Labs (2/17): K 4.7, creatinine 1.14, BNP 870 Labs (3/17): K 4.5, creatinine 1.10, BNP 1257 Labs (5/17): K 4.2, creatinine 1.2, BNP 818 Labs (7/17): LDL 149, HDL 40, TGs 210 Labs (9/17): K 4.9, creatinine 1.36 Labs (10/17): K 3.7, creatinine 1.2, LDL 87, HDL 32, LFTs normal Labs (12/17): K 4.5, creatinine 1.34, digoxin 0.3 Labs (2/18): K 4.4, creatinine 1.48, hgb 11.8 Labs (3/18): K 5 => 4.6, creatinine 1.54 => 1.46, digoxin 1.3 => 0.3 Labs (5/18): TSH normal Labs (7/18): K 3.7, creatinine 1.33, hgb 11.4, digoxin 0.6, LFTs normal  ECG (personally reviewed): NSR, old ASMI, IVCD QRS 138 msec  Past Medical History:  1. HYPERTENSION  2. HYPERLIPIDEMIA: Myalgias with atorvastatin and Crestor 3. ECZEMA  4. RHINITIS  5. HERPES ZOSTER OPHTHALMICUS  6. ADENOCARCINOMA, PROSTATE: Status post prostatectomy in 2009. Has had some incontinence since then.  7. Diabetes mellitus type II  8. Arthritis  9. Obesity  10. GERD: rare  11. CAD: Presented with exertional dyspnea, never had chest pain. LHC (2/12) with subtotalled proximal RCA and left to right collaterals, 90% proximal moderate-sized ramus, 95% proximal relatively small CFX, 40-50% proximal LAD. Cardiac MRI (2/12) showed EF 21%, some mild scar in basal segments but all wall segments would be expected to be viable. DES x 5 (overlapping) to RCA, DES x 1 to RI 01/30/11 .  Bled into leg with Effient use (long, complicated course).  LHC (1/14): AV  LCx small with subtotal occlusion, 95% ostial instent restenosis in RCA. PCI in 2/14 to ostial RCA with 3.5 x 15 Xience DES.  Plavix allergy (hives) so put on ticlopidine.  - LHC (12/17):  the RCA was totally occluded with collaterals, there was 95% ostial to mid LAD stenosis, patient had PCI with DES to proximal-mid LAD. 12. Ischemic CMP: Echo (2/12) with moderately dilated LV, EF about 20% with diffuse hypokinesis and inferior akinesis, pseudonormal diastolic function, mild MR, severe LAE, mildly decreased RV systolic function. RHC (2/12) with mean RA 12, PA 40/25, mean PCWP 26, CI 2.1.  Echo (5/12) with EF 40% (appeared worse to my eye) with posterior HK, basal inferior AK, inferoseptal AK, basal anteroseptal AK, mild MR.  Cardiac MRI was repeated and  showed EF 32% (improved from 21%) and mild LV dilation (was severely dilated before) with diffuse hypokinesis and subendocardial scar in the basal inferior, basal posterior, and basal anterolateral segments.  Echo (2/13) with EF 45%, moderate LV dilation, mild LVH.  Echo (1/14) with EF 15%, diffuse hypokinesis, inferoposterior akinesis, mild MR, grade I diastolic dysfunction.  Echo (5/14) with EF 30-35%, mild LV dilation, akinesis of the basal inferior wall otherwise diffuse hypokinesis.  Echo (12/14) with EF 30-35%, mild LV dilation, diffuse hypokinesis with basal inferior and posterior akinesis. Echo (12/15) with EF 35%, mildly dilated LV, wall motion abnormalities, normal RV size and systolic function. Echo (12/16) with EF 20-25%, diffuse hypokinesis, severe LV dilation, mild MR.  - Echo (10/17): EF 15%, grade II diastolic dysfunction, mild MR, mildly decreased RV systolic function.  - Possible angioedema related to Entresto.  - Hyperkalemia with spironolactone 12.5 daily.  - CPX (10/17): peak VO2 10.6, VE/VCO2 slope 48.6, RER 1.12.  Severe HF limitation.  - RHC (12/17): mean RA 14, PA 53/27, mean PCWP 25, CI 1.93.  - Echo (3/18): EF 20-25%, moderate LV  dilation, mild LVH, normal RV size and systolic function, mild aortic stenosis.  - Medtronic ICD 3/18.  13. Cervical OA.  14. Polymyalgia rheumatica 15. OSA: Severe on sleep study 10/12.  On CPAP.  16. Left lower leg hematoma in setting of Effient use.  He developed a left gastrocnemius abscess and septic shock with prolonged hospitalization beginning in 7/13.  17. PAD: Lower extremity arterial doppler evaluation in 2/17 showed occluded peroneal arteries bilaterally, ABI 1.1 (R) and 1.0 (L).   18. Carotid dopplers (10/17) with minimal disease.  19. Atrial fibrillation: Paroxysmal.  20. Ventricular fibrillation arrest: 3/18.  Medtronic ICD placed, now on amiodarone.  - VT storm 7/18, now s/p VT ablation.  21. Aortic stenosis: Mild on 3/18 echo.   Family History:  Father died with MI at age 82. He was an alcoholic. Mother died with cancer at around 24.   Social History:  Occupation: retired Administrator  Married, lives in [place]  Past smoker, quit around [place] reviewed and negative except as per HPI.   Current Outpatient Prescriptions  Medication Sig Dispense Refill  . amiodarone (PACERONE) 200 MG tablet Take 400mg  (2 tablets) twice daily for 2 weeks then 200mg  (one tablet) twice daily for 2 weeks, then 200mg  (one tablet) daily. 90 tablet 3  . carvedilol (COREG) 12.5 MG tablet Take 0.5 tablets (6.25 mg total) by mouth 2 (two) times daily. 60 tablet 6  . digoxin (LANOXIN) 0.125 MG tablet Take 0.5 tablets (0.0625 mg total) by mouth daily. 15 tablet 6  . diphenhydramine-acetaminophen (TYLENOL PM) 25-500 MG TABS tablet Take 1 tablet by mouth at bedtime as needed (sleep).    . Evolocumab (REPATHA SURECLICK) 093 MG/ML SOAJ Inject 1 pen into the skin every 14 (fourteen) days.    . furosemide (LASIX) 20 MG tablet Take 2 tablets (40 mg total) by mouth daily. 30 tablet 6  . glimepiride (AMARYL) 2 MG tablet TAKE 1 TABLET(2 MG) BY MOUTH DAILY BEFORE BREAKFAST 90 tablet 1  .  Lancets (ACCU-CHEK SOFT TOUCH) lancets Check blood sugars 1-2 times per day. DX: E11.65 200 each 5  . losartan (COZAAR) 25 MG tablet Take 0.5 tablets (12.5 mg total) by mouth daily. 15 tablet 3  . Multiple Vitamins-Minerals (CENTRUM SILVER ADULT 50+) TABS Take 1 tablet by mouth daily.    . Omega-3 Fatty Acids (FISH OIL) 1000  MG CAPS Take 1,000 mg by mouth 2 (two) times daily.     . pravastatin (PRAVACHOL) 20 MG tablet Take 1 tablet (20 mg total) by mouth every evening. 30 tablet 2  . rivaroxaban (XARELTO) 20 MG TABS tablet Take 1 tablet (20 mg total) by mouth daily with supper. (Patient taking differently: Take 20 mg by mouth daily after breakfast. ) 30 tablet 6  . spironolactone (ALDACTONE) 25 MG tablet Take 0.5 tablets (12.5 mg total) by mouth daily. 15 tablet 6  . traMADol (ULTRAM) 50 MG tablet Take 1 tablet (50 mg total) by mouth every 8 (eight) hours as needed for moderate pain. (Patient taking differently: Take 50 mg by mouth every 8 (eight) hours as needed (for pain). ) 20 tablet 0   No current facility-administered medications for this encounter.     BP (!) 92/48   Pulse (!) 56    Wt Readings from Last 3 Encounters:  06/21/17 227 lb 14.4 oz (103.4 kg)  06/10/17 229 lb (103.9 kg)  05/27/17 228 lb (103.4 kg)   General: Well appearing. No resp difficulty. HEENT: Normal Neck: Supple. JVP not elevated. Carotids 2+ bilat; no bruits. No thyromegaly or nodule noted. Cor: PMI nondisplaced. RRR, no S3/S4, no murmur.   Lungs: CTAB, normal effort. Abdomen: Obese, soft, non-tender, non-distended, no HSM. No bruits or masses. +BS  Extremities: No cyanosis, clubbing, rash.  No edema.  Neuro: Alert & orientedx3, cranial nerves grossly intact. moves all 4 extremities w/o difficulty. Affect pleasant   Assessment/Plan:  1. Chronic systolic CHF: Ischemic cardiomyopathy.  3/18 echo with EF 20-25%, diffuse hypokinesis.  CPX in 10/17 with severe HF limitation.  RHC in 12/17 with elevated filling  pressures and low cardiac output.  He is now s/p PCI to proximal to mid LAD with much improved symptoms.  Medtronic ICD in 3/18 after vfib arrest. He had VT again in 6/18, then VT storm in 7/18 now s/p VT ablation.  NYHA class II symptoms but lightheaded/orthostatic.  SBP in 80s-90s today.  He does not look volume overloaded.  Given low BP, concerned for possible overdiuresis.  - With low BP will have him hold 2 doses of Lasix then cut Lasix back to 40 mg once daily.  BMET today.      - Continue spironolactone 12.5 daily.  This dose is already cut back compared to prior appt.   - Continue losartan 12.5 mg daily. This dose is already cut back compared to prior appt.  Of note, patient had possible angioedema related to [facility].  Would not re-challenge.  - With low BP, decrease Coreg to 6.25 mg bid.   - Continue digoxin 0.0625 daily, recent level was ok.     - He may be an LVAD candidate in the future.   - Given 2 episodes of VT recently, 6/18 and 7/18, I think that we ought to repeat a right and left heart cath to reassess coronaries as well as his cardiac output.  We discussed cardiac cath today, I went over risks/benefits.  Patient agrees to procedure next week.  Will hold Xarelto 2 days prior.  2. CAD:  Now s/p PCI to proximal LAD in 12/17.  He had an occluded RCA but there were left to right collaterals.    - Continue pravastatin and Repatha.   - Now on Xarelto 20 mg daily only and OFF ticagrelor.  3. HLD: Continue Repatha and low dose pravastatin.  4. PAD: Occluded peroneal arteries bilaterally.  No significant claudication.  No change.  5. Diabetes type II: Unable to get empagliflozin due to cost. Per PCP.  6. Atrial fibrillation:  NSR by EKG today.  - Continue Xarelto 20 mg daily. - Continue amiodarone 200 mg daily.  LFTs and TSH normal recently.  He will need regular eye exams.  - Could eventually consider atrial fibrillation ablation eventually based on [facility]-HF data if recurs. 7.  Ventricular fibrillation arrest: On amiodarone and has Medtronic ICD.  He had VT ablation in 7/18 after episode of VT storm.  - No driving until 0/78.   - Given multiple VT episodes, LHC/RHC as above.  8. OSA: Not using CPAP.    Loralie Champagne, MD  06/29/2017

## 2017-06-29 NOTE — Progress Notes (Addendum)
Patient presented via wheelchair for BeAT-HF 9 Month visit (control arm-no device). He states he has been dizzy off and on since yesterday. Took all of his medications accept amiodarone this am. Denies SOB, CP, or other ACS symptoms.  Wt 228 lbs, BP 83/47 HR 60 RR 16. Call to Dr. Aundra Dubin who ask that patient be taken back to HF Clinic and BMET, CBC and EKG collected. Wayne Rosebush RN aware and patient placed in Room A via wheelchair. In transient to HF Clinic patient states his  defibrillator may have fired this am, states "my defibrillator may have fired low shock this am, I'm not sure." Will make Dr. Aundra Dubin aware. Patient is A & O x3, very pleasant. His wife, Wayne [name] is present and anxious as patient has had multiple medical episodes lately. Reassured.

## 2017-06-29 NOTE — Patient Instructions (Signed)
HOLD Furosemide for 2 DAYS, then restart at only 40 mg DAILY  Decrease Carvedilol to 6.25 mg (1/2 tab) Twice daily   Heart Catheterization Mon 7/23, see instruction sheet  Your physician recommends that you schedule a follow-up appointment in: 3 weeks

## 2017-06-29 NOTE — Telephone Encounter (Signed)
Received incoming call. Pt stated he had device check today with Dr. Aundra Dubin. Pt wanted to know if he needed the appt tomorrow. Forwarded to Dr. Lovena Le to advise. Informed Dr. Lovena Le stated okay to cancel appt tomorrow. Pt needs 6 months recall. Informed pt will receive reminder letter 2 months prior to recall, but still mark calendar. Pt verbalized understanding.

## 2017-06-30 ENCOUNTER — Encounter: Payer: Medicare Other | Admitting: Internal Medicine

## 2017-07-01 ENCOUNTER — Telehealth (HOSPITAL_COMMUNITY): Payer: Self-pay | Admitting: *Deleted

## 2017-07-01 NOTE — Telephone Encounter (Signed)
Pt advised to take losartan and spironolactone in the PM because of dizziness. No other changes at this time. Per Dr.McLean

## 2017-07-02 ENCOUNTER — Telehealth (HOSPITAL_COMMUNITY): Payer: Self-pay | Admitting: Pharmacist

## 2017-07-02 NOTE — Telephone Encounter (Signed)
Mr. Kutner called stating that he has been dizzy recently and yesterday he did start taking his spironolactone in the evening with minimal improvement. Upon questioning, he stated that he has actually been taking losartan 12.5 mg BID instead of once daily. I have advised him to only take the losartan 12.5 mg once a day in the morning as prescribed. He verbalized understanding and will call with any further issues.   Ruta Hinds. Velva Harman, PharmD, BCPS, CPP Clinical Pharmacist Pager: [phone] Phone: [phone] 07/02/2017 11:53 AM

## 2017-07-05 ENCOUNTER — Inpatient Hospital Stay (HOSPITAL_COMMUNITY)
Admission: EM | Admit: 2017-07-05 | Discharge: 2017-07-08 | DRG: 251 | Disposition: A | Discharge status: Home / Self Care | Payer: Medicare Other | Attending: Cardiology | Admitting: Cardiology

## 2017-07-05 ENCOUNTER — Encounter (HOSPITAL_COMMUNITY): Payer: Self-pay | Admitting: Emergency Medicine

## 2017-07-05 DIAGNOSIS — I481 Persistent atrial fibrillation: Secondary | ICD-10-CM | POA: Diagnosis present

## 2017-07-05 DIAGNOSIS — G4733 Obstructive sleep apnea (adult) (pediatric): Secondary | ICD-10-CM | POA: Diagnosis present

## 2017-07-05 DIAGNOSIS — Z8546 Personal history of malignant neoplasm of prostate: Secondary | ICD-10-CM | POA: Diagnosis not present

## 2017-07-05 DIAGNOSIS — Z6834 Body mass index (BMI) 34.0-34.9, adult: Secondary | ICD-10-CM

## 2017-07-05 DIAGNOSIS — E119 Type 2 diabetes mellitus without complications: Secondary | ICD-10-CM

## 2017-07-05 DIAGNOSIS — Z4502 Encounter for adjustment and management of automatic implantable cardiac defibrillator: Secondary | ICD-10-CM

## 2017-07-05 DIAGNOSIS — E669 Obesity, unspecified: Secondary | ICD-10-CM | POA: Diagnosis present

## 2017-07-05 DIAGNOSIS — Z7901 Long term (current) use of anticoagulants: Secondary | ICD-10-CM | POA: Diagnosis not present

## 2017-07-05 DIAGNOSIS — I5022 Chronic systolic (congestive) heart failure: Secondary | ICD-10-CM

## 2017-07-05 DIAGNOSIS — I480 Paroxysmal atrial fibrillation: Secondary | ICD-10-CM | POA: Diagnosis present

## 2017-07-05 DIAGNOSIS — Z79899 Other long term (current) drug therapy: Secondary | ICD-10-CM | POA: Diagnosis not present

## 2017-07-05 DIAGNOSIS — E1159 Type 2 diabetes mellitus with other circulatory complications: Secondary | ICD-10-CM | POA: Diagnosis not present

## 2017-07-05 DIAGNOSIS — Z87891 Personal history of nicotine dependence: Secondary | ICD-10-CM | POA: Diagnosis not present

## 2017-07-05 DIAGNOSIS — I255 Ischemic cardiomyopathy: Secondary | ICD-10-CM | POA: Diagnosis present

## 2017-07-05 DIAGNOSIS — K219 Gastro-esophageal reflux disease without esophagitis: Secondary | ICD-10-CM | POA: Diagnosis not present

## 2017-07-05 DIAGNOSIS — R531 Weakness: Secondary | ICD-10-CM | POA: Diagnosis not present

## 2017-07-05 DIAGNOSIS — I5042 Chronic combined systolic (congestive) and diastolic (congestive) heart failure: Secondary | ICD-10-CM | POA: Diagnosis present

## 2017-07-05 DIAGNOSIS — M353 Polymyalgia rheumatica: Secondary | ICD-10-CM | POA: Diagnosis present

## 2017-07-05 DIAGNOSIS — I479 Paroxysmal tachycardia, unspecified: Secondary | ICD-10-CM | POA: Diagnosis not present

## 2017-07-05 DIAGNOSIS — Y831 Surgical operation with implant of artificial internal device as the cause of abnormal reaction of the patient, or of later complication, without mention of misadventure at the time of the procedure: Secondary | ICD-10-CM | POA: Diagnosis present

## 2017-07-05 DIAGNOSIS — E785 Hyperlipidemia, unspecified: Secondary | ICD-10-CM | POA: Diagnosis not present

## 2017-07-05 DIAGNOSIS — I472 Ventricular tachycardia: Principal | ICD-10-CM | POA: Diagnosis present

## 2017-07-05 DIAGNOSIS — Z955 Presence of coronary angioplasty implant and graft: Secondary | ICD-10-CM | POA: Diagnosis not present

## 2017-07-05 DIAGNOSIS — T82855A Stenosis of coronary artery stent, initial encounter: Secondary | ICD-10-CM | POA: Diagnosis not present

## 2017-07-05 DIAGNOSIS — I2510 Atherosclerotic heart disease of native coronary artery without angina pectoris: Secondary | ICD-10-CM

## 2017-07-05 DIAGNOSIS — E1151 Type 2 diabetes mellitus with diabetic peripheral angiopathy without gangrene: Secondary | ICD-10-CM | POA: Diagnosis present

## 2017-07-05 DIAGNOSIS — T82198A Other mechanical complication of other cardiac electronic device, initial encounter: Secondary | ICD-10-CM | POA: Diagnosis not present

## 2017-07-05 DIAGNOSIS — Z95810 Presence of automatic (implantable) cardiac defibrillator: Secondary | ICD-10-CM | POA: Diagnosis not present

## 2017-07-05 DIAGNOSIS — Z8249 Family history of ischemic heart disease and other diseases of the circulatory system: Secondary | ICD-10-CM | POA: Diagnosis not present

## 2017-07-05 DIAGNOSIS — Z8674 Personal history of sudden cardiac arrest: Secondary | ICD-10-CM | POA: Diagnosis not present

## 2017-07-05 DIAGNOSIS — I110 Hypertensive heart disease with heart failure: Secondary | ICD-10-CM | POA: Diagnosis present

## 2017-07-05 DIAGNOSIS — Z888 Allergy status to other drugs, medicaments and biological substances status: Secondary | ICD-10-CM | POA: Diagnosis not present

## 2017-07-05 DIAGNOSIS — R404 Transient alteration of awareness: Secondary | ICD-10-CM | POA: Diagnosis not present

## 2017-07-05 DIAGNOSIS — I4729 Other ventricular tachycardia: Secondary | ICD-10-CM

## 2017-07-05 DIAGNOSIS — Z9861 Coronary angioplasty status: Secondary | ICD-10-CM

## 2017-07-05 LAB — CBC WITH DIFFERENTIAL/PLATELET
BASOS ABS: 0 10*3/uL (ref 0.0–0.1)
Basophils Relative: 1 %
Eosinophils Absolute: 0.4 10*3/uL (ref 0.0–0.7)
Eosinophils Relative: 7 %
HEMATOCRIT: 36.9 % — AB (ref 39.0–52.0)
Hemoglobin: 11.8 g/dL — ABNORMAL LOW (ref 13.0–17.0)
Lymphocytes Relative: 27 %
Lymphs Abs: 1.3 10*3/uL (ref 0.7–4.0)
MCH: 28.7 pg (ref 26.0–34.0)
MCHC: 32 g/dL (ref 30.0–36.0)
MCV: 89.8 fL (ref 78.0–100.0)
Monocytes Absolute: 0.4 10*3/uL (ref 0.1–1.0)
Monocytes Relative: 9 %
NEUTROS ABS: 2.7 10*3/uL (ref 1.7–7.7)
NEUTROS PCT: 56 %
PLATELETS: 170 10*3/uL (ref 150–400)
RBC: 4.11 MIL/uL — AB (ref 4.22–5.81)
RDW: 14.6 % (ref 11.5–15.5)
WBC: 4.8 10*3/uL (ref 4.0–10.5)

## 2017-07-05 LAB — I-STAT CHEM 8, ED
BUN: 19 mg/dL (ref 6–20)
CHLORIDE: 104 mmol/L (ref 101–111)
CREATININE: 1.1 mg/dL (ref 0.61–1.24)
Calcium, Ion: 1.14 mmol/L — ABNORMAL LOW (ref 1.15–1.40)
GLUCOSE: 320 mg/dL — AB (ref 65–99)
HCT: 39 % (ref 39.0–52.0)
Hemoglobin: 13.3 g/dL (ref 13.0–17.0)
POTASSIUM: 4.1 mmol/L (ref 3.5–5.1)
Sodium: 139 mmol/L (ref 135–145)
TCO2: 22 mmol/L (ref 0–100)

## 2017-07-05 LAB — I-STAT TROPONIN, ED: Troponin i, poc: 0.04 ng/mL (ref 0.00–0.08)

## 2017-07-05 LAB — MAGNESIUM: Magnesium: 2.3 mg/dL (ref 1.7–2.4)

## 2017-07-05 MED ORDER — SODIUM CHLORIDE 0.9% FLUSH
3.0000 mL | Freq: Two times a day (BID) | INTRAVENOUS | Status: DC
  Administered 2017-07-06 – 2017-07-07 (×2): 3 mL via INTRAVENOUS

## 2017-07-05 MED ORDER — SPIRONOLACTONE 25 MG PO TABS
12.5000 mg | ORAL_TABLET | Freq: Every day | ORAL | Status: DC
  Administered 2017-07-07 – 2017-07-08 (×2): 12.5 mg via ORAL
  Filled 2017-07-05 (×2): qty 1

## 2017-07-05 MED ORDER — LOSARTAN POTASSIUM 25 MG PO TABS
12.5000 mg | ORAL_TABLET | Freq: Every day | ORAL | Status: DC
  Administered 2017-07-07: 12.5 mg via ORAL
  Filled 2017-07-05 (×2): qty 1

## 2017-07-05 MED ORDER — PRAVASTATIN SODIUM 20 MG PO TABS
20.0000 mg | ORAL_TABLET | Freq: Every evening | ORAL | Status: DC
  Administered 2017-07-06 – 2017-07-07 (×2): 20 mg via ORAL
  Filled 2017-07-05 (×2): qty 1

## 2017-07-05 MED ORDER — AMIODARONE HCL 200 MG PO TABS: 200.0000 mg | ORAL_TABLET | Freq: Two times a day (BID) | ORAL | Status: DC

## 2017-07-05 MED ORDER — DIPHENHYDRAMINE-APAP (SLEEP) 25-500 MG PO TABS: 1.0000 | ORAL_TABLET | Freq: Every day | ORAL | Status: DC

## 2017-07-05 MED ORDER — CARVEDILOL 6.25 MG PO TABS
6.2500 mg | ORAL_TABLET | Freq: Two times a day (BID) | ORAL | Status: DC
  Administered 2017-07-06 – 2017-07-07 (×2): 6.25 mg via ORAL
  Filled 2017-07-05 (×2): qty 1

## 2017-07-05 MED ORDER — DIGOXIN 125 MCG PO TABS
0.0625 mg | ORAL_TABLET | Freq: Every day | ORAL | Status: DC
  Administered 2017-07-07 – 2017-07-08 (×2): 0.0625 mg via ORAL
  Filled 2017-07-05 (×2): qty 1

## 2017-07-05 NOTE — ED Notes (Signed)
Pt and wife provided sandwiches and beverages ok per cards

## 2017-07-05 NOTE — ED Provider Notes (Signed)
[facility] DEPT Provider Note   CSN: [id number] Arrival date & time: 07/05/17  2030     History   Chief Complaint Chief Complaint  Patient presents with  . VTACH PTA    HPI COURTNEY FENLON is a 76 y.o. male.  HPI   76 y/o male with past medical history of CAD (s/p PCI to proximal LAD with known occlusion of RCA), ICM (s/p ICD placement), chronic combined systolic and diastolic CHF (EF 40-98% by echo in 05/2017), PAF (on Xarelto), HTN, HLD, and Type 2 DM presents to the ER after his defibrillator fired. Pt reports that he was at his house getting ready for shower when suddenly he felt dizzy. Pt laid down and that's when the defibrillator fired. PT had some diaphoresis as well. Pt had no chest pain, near syncope, dib, palpitations at any point. Paramedics noted that pt was in wide complex tachycardia and gave him amiodarone en route.  Of note, pt is supposed to be getting cath tomorrow.  Past Medical History:  Diagnosis Date  . Adenocarcinoma of prostate ([place])    s/p seed implants  . Arthritis   . Cellulitis of left leg    a. 12/1912 complicated by septic shock  . Chronic combined systolic and diastolic CHF (congestive heart failure) ([place])   . Colon polyps   . CORONARY ATHEROSCLEROSIS NATIVE CORONARY ARTERY    a. 01/2011 Cath/PCI: LM nl, LAD 40-50p, D1 80-small, LCX 95-small, RI 90, RCA 100, EF 20%;  b. 01/2011 Card MRI - No transmural scar;  c. 01/2011 PCI RCA->5 Promus DES, RI->3.0x16 Promus DES; d. Cath 01/13/13 patent LAD & Ramus, diffuse LCx dz, RCA mult overlapping stents w/ 95% osital stenosis, EF 20%, s/p DES to ostial/prox RCA 01/24/13   . Diabetes mellitus, type 2 ([place])   . GERD (gastroesophageal reflux disease)   . Hematoma of leg    a. left leg hematoma 03/2012 in the setting of asa/effient  . Herpes zoster ophthalmicus   . HYPERLIPIDEMIA    intolerant to Lipitor (myalgias)  . HYPERTENSION   . Ischemic cardiomyopathy    a. 01/2012 Echo EF 45%, mild LVH; b. EF15%, grade  1 diastolic dysfunction, diffuse hypokinesis, inferoposterior akinesis   . Noncompliance   . Obesity   . OSA (obstructive sleep apnea)   . Polymyalgia rheumatica [facility])     Patient Active Problem List   Diagnosis Date Noted  . Diabetes mellitus ([place])   . ICD (implantable cardioverter-defibrillator) discharge 07/05/2017  . Syncope and collapse   . Ventricular tachycardia ([place]) 06/18/2017  . Paroxysmal atrial fibrillation ([place]) 06/10/2017  . Hypoxemia   . VT (ventricular tachycardia) ([place])   . Cardiac arrest ([place]) 05/22/2017  . Defibrillator discharge 02/11/2017  . Atrial fibrillation with rapid ventricular response ([place]) 01/16/2017  . Coronary stent occlusion 11/18/2016  . Type 2 diabetes mellitus, uncontrolled ([place]) 09/30/2016  . CKD (chronic kidney disease) stage 3, GFR 30-59 ml/min 09/30/2016  . CAD (coronary artery disease) 11/20/2015  . Leucocytoclastic vasculitis ([place]) 06/25/2012  . Staphylococcus aureus bacteremia with sepsis ([place]) 06/18/2012  . NSTEMI, initial episode of care ([place]) 06/16/2012  . Cellulitis and abscess of lower leg 06/14/2012    Class: Acute  . Healthcare-associated pneumonia 06/14/2012    Class: Acute  . Hyperglycemia 06/14/2012    Class: Acute  . Leg swelling 04/22/2012  . Obesity 01/08/2012  . OSA (obstructive sleep apnea) 09/02/2011  . Systolic CHF, chronic ([place]) 06/10/2011  . Polymyalgia rheumatica ([place]) 06/02/2011  . HEMATOCHEZIA  02/10/2011  . BURSITIS, LEFT HIP 02/10/2011  . CORONARY ATHEROSCLEROSIS NATIVE CORONARY ARTERY 01/27/2011  . CARDIOMYOPATHY 01/21/2011  . CHEST PAIN UNSPECIFIED 01/20/2011  . DYSPNEA 01/16/2011  . ABDOMINAL PAIN, UNSPECIFIED SITE 01/13/2011  . ECZEMA 01/28/2010  . BRONCHITIS, ACUTE WITH MILD BRONCHOSPASM 12/21/2009  . HERPES ZOSTER OPHTHALMICUS 07/19/2009  . ADENOCARCINOMA, PROSTATE 06/20/2009  . Secondary DM with peripheral vascular disease, uncontrolled ([place]) 06/20/2009  . Hyperlipidemia 06/20/2009  . Essential  hypertension 06/20/2009  . HYPERTROPHY PROSTATE W/UR OBST & OTH LUTS 06/20/2009    Past Surgical History:  Procedure Laterality Date  . CARDIAC CATHETERIZATION  01/24/2013  . CARDIAC CATHETERIZATION N/A 11/14/2016   Procedure: Right/Left Heart Cath and Coronary Angiography;  Surgeon: Larey Dresser, MD;  Location: [facility] CV LAB;  Service: Cardiovascular;  Laterality: N/A;  . CARDIAC CATHETERIZATION N/A 11/17/2016   Procedure: Coronary Stent Intervention w/Impella;  Surgeon: Peter M Martinique, MD;  Location: [facility] LAB;  Service: Cardiovascular;  Laterality: N/A;  . CARDIAC CATHETERIZATION N/A 11/17/2016   Procedure: Coronary Atherectomy;  Surgeon: Peter M Martinique, MD;  Location: [facility] LAB;  Service: Cardiovascular;  Laterality: N/A;  . CORONARY ANGIOPLASTY WITH STENT PLACEMENT  01/24/2013   DES to RCA  . CORONARY BALLOON ANGIOPLASTY N/A 07/06/2017   Procedure: Coronary Balloon Angioplasty;  Surgeon: Sherren Mocha, MD;  Location: [facility] LAB;  Service: Cardiovascular;  Laterality: N/A;  . CORONARY STENT PLACEMENT  2012   reports 6 stents placed  . I&D EXTREMITY  06/15/2012   Procedure: IRRIGATION AND DEBRIDEMENT EXTREMITY;  Surgeon: Newt Minion, MD;  Location: [facility];  Service: Orthopedics;  Laterality: Left;  I&D Left Posterior Knee  . I&D EXTREMITY  06/30/2012   Procedure: IRRIGATION AND DEBRIDEMENT EXTREMITY;  Surgeon: Newt Minion, MD;  Location: [facility];  Service: Orthopedics;  Laterality: Left;  Left Leg Irrigation and Debridement and placement of Wound VAC and application of  A-cell  . I&D EXTREMITY  07/20/2012   Procedure: IRRIGATION AND DEBRIDEMENT EXTREMITY;  Surgeon: Newt Minion, MD;  Location: [facility];  Service: Orthopedics;  Laterality: Left;  Irrigation and Debridement Left Leg and Place antibiotic beads   . ICD IMPLANT N/A 02/12/2017   Procedure: ICD Implant;  Surgeon: Evans Lance, MD;  Location: [facility] CV LAB;  Service: Cardiovascular;  Laterality:  N/A;  . LEAD REVISION/REPAIR N/A 05/26/2017   Procedure: Lead Revision/Repair;  Surgeon: Evans Lance, MD;  Location: [facility] CV LAB;  Service: Cardiovascular;  Laterality: N/A;  . LEFT HEART CATH AND CORONARY ANGIOGRAPHY N/A 07/06/2017   Procedure: Left Heart Cath and Coronary Angiography;  Surgeon: Larey Dresser, MD;  Location: [facility] LAB;  Service: Cardiovascular;  Laterality: N/A;  . PERCUTANEOUS CORONARY STENT INTERVENTION (PCI-S) N/A 01/24/2013   Procedure: PERCUTANEOUS CORONARY STENT INTERVENTION (PCI-S);  Surgeon: Wellington Hampshire, MD;  Location: [facility] CATH LAB;  Service: Cardiovascular;  Laterality: N/A;  . PROSTATE SURGERY     cancer, seed implant  . ULTRASOUND GUIDANCE FOR VASCULAR ACCESS  11/14/2016   Procedure: Ultrasound Guidance For Vascular Access;  Surgeon: Larey Dresser, MD;  Location: [facility] LAB;  Service: Cardiovascular;;  . Stephanie Coup ABLATION N/A 06/19/2017   Procedure: Stephanie Coup Ablation;  Surgeon: Evans Lance, MD;  Location: [facility];  Service: Cardiovascular;  Laterality: N/A;       Home Medications    Prior to Admission medications   Medication Sig Start Date  End Date Taking? Authorizing Provider  amiodarone (PACERONE) 200 MG tablet Take 400mg  (2 tablets) twice daily for 2 weeks then 200mg  (one tablet) twice daily for 2 weeks, then 200mg  (one tablet) daily. Patient taking differently: Take 200 mg by mouth 2 (two) times daily. TO TAPER DOWN TO 200 MG ONCE A DAY THEREAFTER ON 07/07/17 06/21/17  Yes Strader, [place] M, PA-C  carvedilol (COREG) 12.5 MG tablet Take 0.5 tablets (6.25 mg total) by mouth 2 (two) times daily. 06/29/17 06/29/18 Yes Larey Dresser, MD  digoxin (LANOXIN) 0.125 MG tablet Take 0.5 tablets (0.0625 mg total) by mouth daily. 02/13/17  Yes Shirley Friar, PA-C  diphenhydramine-acetaminophen (TYLENOL PM) 25-500 MG TABS tablet Take 1 tablet by mouth at bedtime.    Yes [provider]  Evolocumab (REPATHA  SURECLICK) 825 MG/ML SOAJ Inject 1 pen into the skin every 14 (fourteen) days.   Yes [provider]  furosemide (LASIX) 20 MG tablet Take 2 tablets (40 mg total) by mouth daily. Patient taking differently: Take 40 mg by mouth 2 (two) times daily.  06/29/17  Yes Larey Dresser, MD  glimepiride (AMARYL) 2 MG tablet TAKE 1 TABLET(2 MG) BY MOUTH DAILY BEFORE BREAKFAST 02/23/17  Yes Burchette, Alinda Sierras, MD  losartan (COZAAR) 25 MG tablet Take 0.5 tablets (12.5 mg total) by mouth daily. 05/28/17  Yes Baldwin Jamaica, PA-C  Multiple Vitamins-Minerals (CENTRUM SILVER ADULT 50+) TABS Take 1 tablet by mouth daily.   Yes [provider]  Omega-3 Fatty Acids (FISH OIL) 1000 MG CAPS Take 1,000 mg by mouth 2 (two) times daily.    Yes [provider]  pravastatin (PRAVACHOL) 20 MG tablet Take 1 tablet (20 mg total) by mouth every evening. 01/23/17 06/29/18 Yes Larey Dresser, MD  spironolactone (ALDACTONE) 25 MG tablet Take 0.5 tablets (12.5 mg total) by mouth daily. 02/13/17  Yes Shirley Friar, PA-C  traMADol (ULTRAM) 50 MG tablet Take 1 tablet (50 mg total) by mouth every 8 (eight) hours as needed for moderate pain. 06/10/17  Yes Shirley Friar, PA-C  Lancets (ACCU-CHEK SOFT TOUCH) lancets Check blood sugars 1-2 times per day. DX: E11.65 09/30/16   Burchette, Alinda Sierras, MD  rivaroxaban (XARELTO) 20 MG TABS tablet Take 1 tablet (20 mg total) by mouth daily with supper. 06/10/17   Shirley Friar, PA-C    Family History Family History  Problem Relation Age of Onset  . Cancer Mother 77       unknown CA  . Heart disease Father   . Alcohol abuse Father   . Heart attack Father 37    Social History Social History  Substance Use Topics  . Smoking status: Former Smoker    Packs/day: 0.30    Years: 20.00    Types: Cigarettes    Quit date: 02/23/1989  . Smokeless tobacco: Never Used  . Alcohol use No     Allergies   Crestor [rosuvastatin calcium]; Entresto  [sacubitril-valsartan]; Plavix [clopidogrel]; Lipitor [atorvastatin]; and Ancef [cefazolin]   Review of Systems Review of Systems  Cardiovascular: Positive for chest pain.  Neurological: Positive for dizziness.  All other systems reviewed and are negative.    Physical Exam Updated Vital Signs BP 113/69 (BP Location: Left Arm)   Pulse 62   Temp 97.8 F (36.6 C) (Oral)   Resp 13   Ht 5\' 8"  (1.727 m)   Wt 103.4 kg (228 lb)   SpO2 97%   BMI 34.67 kg/m   Physical  Exam  Constitutional: He is oriented to person, place, and time. He appears well-developed.  HENT:  Head: Normocephalic and atraumatic.  Eyes: Pupils are equal, round, and reactive to light. Conjunctivae and EOM are normal.  Neck: Normal range of motion. Neck supple.  Cardiovascular: Normal rate and regular rhythm.   Pulmonary/Chest: Effort normal and breath sounds normal.  Abdominal: Soft. Bowel sounds are normal. He exhibits no distension and no mass. There is no tenderness. There is no rebound and no guarding.  Musculoskeletal: He exhibits no deformity.  Neurological: He is alert and oriented to person, place, and time.  Skin: Skin is warm.  Nursing note and vitals reviewed.    ED Treatments / Results  Labs (all labs ordered are listed, but only abnormal results are displayed) Labs Reviewed  CBC WITH DIFFERENTIAL/PLATELET - Abnormal; Notable for the following:       Result Value   RBC 4.11 (*)    Hemoglobin 11.8 (*)    HCT 36.9 (*)    All other components within normal limits  BASIC METABOLIC PANEL - Abnormal; Notable for the following:    Glucose, Bld 241 (*)    Calcium 8.8 (*)    GFR calc non Af Amer 58 (*)    All other components within normal limits  CBC - Abnormal; Notable for the following:    RBC 4.17 (*)    Hemoglobin 11.8 (*)    HCT 37.5 (*)    All other components within normal limits  TROPONIN I - Abnormal; Notable for the following:    Troponin I 0.07 (*)    All other components within  normal limits  TROPONIN I - Abnormal; Notable for the following:    Troponin I 0.06 (*)    All other components within normal limits  TROPONIN I - Abnormal; Notable for the following:    Troponin I 0.05 (*)    All other components within normal limits  GLUCOSE, CAPILLARY - Abnormal; Notable for the following:    Glucose-Capillary 213 (*)    All other components within normal limits  GLUCOSE, CAPILLARY - Abnormal; Notable for the following:    Glucose-Capillary 239 (*)    All other components within normal limits  GLUCOSE, CAPILLARY - Abnormal; Notable for the following:    Glucose-Capillary 171 (*)    All other components within normal limits  DIGOXIN LEVEL - Abnormal; Notable for the following:    Digoxin Level 0.2 (*)    All other components within normal limits  CBC - Abnormal; Notable for the following:    Hemoglobin 12.3 (*)    HCT 38.2 (*)    All other components within normal limits  GLUCOSE, CAPILLARY - Abnormal; Notable for the following:    Glucose-Capillary 158 (*)    All other components within normal limits  GLUCOSE, CAPILLARY - Abnormal; Notable for the following:    Glucose-Capillary 173 (*)    All other components within normal limits  BASIC METABOLIC PANEL - Abnormal; Notable for the following:    Potassium 5.2 (*)    Glucose, Bld 254 (*)    Creatinine, Ser 1.25 (*)    GFR calc non Af Amer 55 (*)    All other components within normal limits  GLUCOSE, CAPILLARY - Abnormal; Notable for the following:    Glucose-Capillary 192 (*)    All other components within normal limits  I-STAT CHEM 8, ED - Abnormal; Notable for the following:    Glucose, Bld 320 (*)  Calcium, Ion 1.14 (*)    All other components within normal limits  MAGNESIUM  PROTIME-INR  APTT  MAGNESIUM  BASIC METABOLIC PANEL  MAGNESIUM  GLUCOSE, CAPILLARY  BASIC METABOLIC PANEL  MAGNESIUM  I-STAT TROPONIN, ED  POCT ACTIVATED CLOTTING TIME  POCT ACTIVATED CLOTTING TIME    EKG  EKG  Interpretation  Date/Time:  Sunday July 05 2017 20:36:09 EDT Ventricular Rate:  71 PR Interval:    QRS Duration: 133 QT Interval:  420 QTC Calculation: 457 R Axis:   39 Text Interpretation:  Sinus rhythm Nonspecific intraventricular conduction delay Anterior infarct, old No acute changes No significant change since last tracing Confirmed by Varney Biles [phone]) on 07/05/2017 9:21:06 PM       Radiology No results found.  Procedures Procedures (including critical care time)  Medications Ordered in ED Medications  losartan (COZAAR) tablet 12.5 mg (12.5 mg Oral Given 07/07/17 0954)  digoxin (LANOXIN) tablet 0.0625 mg (0.0625 mg Oral Given 07/07/17 0954)  spironolactone (ALDACTONE) tablet 12.5 mg (12.5 mg Oral Given 07/07/17 0955)  pravastatin (PRAVACHOL) tablet 20 mg (20 mg Oral Given 07/07/17 1708)  sodium chloride flush (NS) 0.9 % injection 3 mL (0 mLs Intravenous Duplicate 0/07/62 2633)  diphenhydrAMINE (BENADRYL) capsule 25 mg (25 mg Oral Given 07/06/17 2140)    And  acetaminophen (TYLENOL) tablet 500 mg (500 mg Oral Given 07/06/17 2140)  amiodarone (PACERONE) tablet 400 mg (400 mg Oral Given 07/07/17 0953)  insulin aspart (novoLOG) injection 0-9 Units (0 Units Subcutaneous Not Given 07/07/17 1700)  insulin aspart (novoLOG) injection 0-5 Units (0 Units Subcutaneous Not Given 07/06/17 2203)  labetalol (NORMODYNE,TRANDATE) injection 10 mg (not administered)  hydrALAZINE (APRESOLINE) injection 5 mg (not administered)  sodium chloride flush (NS) 0.9 % injection 3 mL (3 mLs Intravenous Given 07/07/17 0956)  sodium chloride flush (NS) 0.9 % injection 3 mL (not administered)  0.9 %  sodium chloride infusion (not administered)  ticagrelor (BRILINTA) tablet 90 mg (90 mg Oral Given 07/07/17 0953)  glimepiride (AMARYL) tablet 2 mg (2 mg Oral Given 07/07/17 0829)  Rivaroxaban (XARELTO) tablet 15 mg (15 mg Oral Given 07/07/17 0954)  ALPRAZolam Duanne Moron) tablet 0.25 mg (0.25 mg Oral Given 07/06/17 2140)   carvedilol (COREG) tablet 12.5 mg (12.5 mg Oral Given 07/07/17 1708)  furosemide (LASIX) tablet 40 mg (40 mg Oral Given 07/07/17 1336)  heparin infusion 2 units/mL in 0.9 % sodium chloride (1,000 mLs Other New Bag/Given 07/06/17 0854)     Initial Impression / Assessment and Plan / ED Course  I have reviewed the triage vital signs and the nursing notes.  Pertinent labs & imaging results that were available during my care of the patient were reviewed by me and considered in my medical decision making (see chart for details).     I personally performed the services described in this documentation, which was scribed in my presence. The recorded information has been reviewed and is accurate.   Pt comes in with cc AICD firing. He is stable now, but he had VT per device interrogation. Cardiology consulted. Pt already has amio on board, no new meds now as patient is currently stable. Cards to admit.  Final Clinical Impressions(s) / ED Diagnoses   Final diagnoses:  AICD discharge  Paroxysmal ventricular tachycardia [facility])    New Prescriptions Current Discharge Medication List       Varney Biles, MD 07/07/17 [phone]

## 2017-07-05 NOTE — ED Notes (Signed)
Pacer pads placed on patient. Pt denies pain, wife at bedside

## 2017-07-05 NOTE — ED Triage Notes (Signed)
Pt transported from home by EMS for c/o dizziness an AICD firing x 1 at home. Per EMS pt did have episode of VTACh enroute, pt given Amiodarone 150mg  IVBP by EMS. Pt in NSR on arrival.20 SL L posterior FA

## 2017-07-05 NOTE — ED Notes (Signed)
Cards at bedside, pt requesting to change IV site

## 2017-07-05 NOTE — H&P (Signed)
CARDIOLOGY HISTORY & PHYSICAL   Referring Physician: Dr. Kathrynn Humble Primary Physician: Dr. Elease Hashimoto Primary Cardiologist: Dr. Aundra Dubin Reason for Admission: ICD shock   HPI: Mr. Springs is a 76 yo man with PMH CAD s/p multiple PCI, ICM with EF 67-20%, chronic systolic and diastolic heart failure, Vfib cardiac arrest 02/2017 and 05/2017 s/p ICD, recent admission for VT storm s/p VT ablation 06/2017, PAF on Xarelto, HTN, HLD, DM2 who presents to ER after ICD shock. Patient reports that he was doing his usual activities and getting ready to take a shower when he felt lightheaded and "knew the shock was coming." He laid down on the bed and ICD discharged x1. Denies any prior to event chest pain, did have sense of fast HR and diaphoresis. EMS en route noted wide complex tachycardia and gave amiodarone IV en route.  Per patient, he has been limiting his activities given recent event with 13 ICD shocks. Was supposed to come in 7/23 for outpatient cath given his recent cardiac arrest and VT storm.  He does note that his R groin swelled significantly after prior cath and still has bruising. Would prefer radial access if possible. ROS positive only for hematuria, which he states is scant but present intermittently for the last month.  Review of Systems:     Cardiac Review of Systems: {Y] = yes [ ]  = no  Chest Pain [  N  ]  Resting SOB [ N  ] Exertional SOB  [Y  ]  Orthopnea Aqua.Slicker  ]   Pedal Edema [  N ]    Palpitations [ Y ] Syncope  [ N ]   Presyncope [  N]  General Review of Systems: [Y] = yes [  ]=no Constitional: recent weight change [  ]; anorexia [  ]; fatigue [  ]; nausea [  ]; night sweats [  ]; fever [  ]; or chills [  ];                                                                     Eyes : blurred vision [  ]; diplopia [   ]; vision changes [  ];  Amaurosis fugax[  ]; Resp: cough [  ];  wheezing[  ];  hemoptysis[  ];  PND [  ];  GI:  gallstones[  ], vomiting[  ];  dysphagia[  ]; melena[  ];   hematochezia [  ]; heartburn[  ];   GU: kidney stones [  ]; hematuria[ Y ];   dysuria [  ];  nocturia[  ]; incontinence [  ];             Skin: rash, swelling[  ];, hair loss[  ];  peripheral edema[  ];  or itching[  ]; Musculosketetal: myalgias[  ];  joint swelling[  ];  joint erythema[  ];  joint pain[  ];  back pain[  ];  Heme/Lymph: bruising[  ];  bleeding[  ];  anemia[  ];  Neuro: TIA[  ];  headaches[  ];  stroke[  ];  vertigo[  ];  seizures[  ];   paresthesias[  ];  difficulty walking[  ];  Psych:depression[  ]; anxiety[  ];  Endocrine: diabetes[  ];  thyroid dysfunction[  ];  Other:  Past Medical History:  Diagnosis Date  . Adenocarcinoma of prostate ([place])    s/p seed implants  . Arthritis   . Cellulitis of left leg    a. 06/8675 complicated by septic shock  . Chronic combined systolic and diastolic CHF (congestive heart failure) ([place])   . Colon polyps   . CORONARY ATHEROSCLEROSIS NATIVE CORONARY ARTERY    a. 01/2011 Cath/PCI: LM nl, LAD 40-50p, D1 80-small, LCX 95-small, RI 90, RCA 100, EF 20%;  b. 01/2011 Card MRI - No transmural scar;  c. 01/2011 PCI RCA->5 Promus DES, RI->3.0x16 Promus DES; d. Cath 01/13/13 patent LAD & Ramus, diffuse LCx dz, RCA mult overlapping stents w/ 95% osital stenosis, EF 20%, s/p DES to ostial/prox RCA 01/24/13   . Diabetes mellitus, type 2 ([place])   . GERD (gastroesophageal reflux disease)   . Hematoma of leg    a. left leg hematoma 03/2012 in the setting of asa/effient  . Herpes zoster ophthalmicus   . HYPERLIPIDEMIA    intolerant to Lipitor (myalgias)  . HYPERTENSION   . Ischemic cardiomyopathy    a. 01/2012 Echo EF 45%, mild LVH; b. HM09%, grade 1 diastolic dysfunction, diffuse hypokinesis, inferoposterior akinesis   . Noncompliance   . Obesity   . OSA (obstructive sleep apnea)   . Polymyalgia rheumatica ([facility])     Medications Prior to Admission  Medication Sig Dispense Refill  . amiodarone (PACERONE) 200 MG tablet Take 400mg  (2 tablets) twice  daily for 2 weeks then 200mg  (one tablet) twice daily for 2 weeks, then 200mg  (one tablet) daily. (Patient taking differently: Take 200 mg by mouth 2 (two) times daily. TO TAPER DOWN TO 200 MG ONCE A DAY THEREAFTER ON 07/07/17) 90 tablet 3  . carvedilol (COREG) 12.5 MG tablet Take 0.5 tablets (6.25 mg total) by mouth 2 (two) times daily. 60 tablet 6  . digoxin (LANOXIN) 0.125 MG tablet Take 0.5 tablets (0.0625 mg total) by mouth daily. 15 tablet 6  . diphenhydramine-acetaminophen (TYLENOL PM) 25-500 MG TABS tablet Take 1 tablet by mouth at bedtime.     . Evolocumab (REPATHA SURECLICK) 470 MG/ML SOAJ Inject 1 pen into the skin every 14 (fourteen) days.    . furosemide (LASIX) 20 MG tablet Take 2 tablets (40 mg total) by mouth daily. (Patient taking differently: Take 40 mg by mouth 2 (two) times daily. ) 30 tablet 6  . glimepiride (AMARYL) 2 MG tablet TAKE 1 TABLET(2 MG) BY MOUTH DAILY BEFORE BREAKFAST 90 tablet 1  . losartan (COZAAR) 25 MG tablet Take 0.5 tablets (12.5 mg total) by mouth daily. 15 tablet 3  . Multiple Vitamins-Minerals (CENTRUM SILVER ADULT 50+) TABS Take 1 tablet by mouth daily.    . Omega-3 Fatty Acids (FISH OIL) 1000 MG CAPS Take 1,000 mg by mouth 2 (two) times daily.     . pravastatin (PRAVACHOL) 20 MG tablet Take 1 tablet (20 mg total) by mouth every evening. 30 tablet 2  . spironolactone (ALDACTONE) 25 MG tablet Take 0.5 tablets (12.5 mg total) by mouth daily. 15 tablet 6  . traMADol (ULTRAM) 50 MG tablet Take 1 tablet (50 mg total) by mouth every 8 (eight) hours as needed for moderate pain. 20 tablet 0  . Lancets (ACCU-CHEK SOFT TOUCH) lancets Check blood sugars 1-2 times per day. DX: E11.65 200 each 5  . rivaroxaban (XARELTO) 20 MG TABS tablet Take 1 tablet (20 mg total) by mouth daily with supper. 30 tablet 6  Allergies  Allergen Reactions  . Crestor [Rosuvastatin Calcium] Other (See Comments)    Myalgia, Interfering with Gait  . Entresto [Sacubitril-Valsartan]  Swelling and Other (See Comments)    Angioedema  . Plavix [Clopidogrel] Itching, Swelling and Other (See Comments)    Nose bleeds and welts on legs & back  . Lipitor [Atorvastatin] Other (See Comments)    Makes legs sore  . Ancef [Cefazolin] Itching and Rash    Describes itching and rash, but said that "it wasn't that bad"    Social History   Social History  . Marital status: Married    Spouse name: N/A  . Number of children: 1  . Years of education: N/A   Occupational History  . 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 Smoking status: Former Smoker    Packs/day: 0.30    Years: 20.00    Types: Cigarettes    Quit date: 02/23/1989  . Smokeless tobacco: Never Used  . Alcohol use No  . Drug use: No  . Sexual activity: Not on file   Other Topics Concern  . Not on file   Social History Narrative   Retired Administrator    Family History  Problem Relation Age of Onset  . Cancer Mother 48       unknown CA  . Heart disease Father   . Alcohol abuse Father   . Heart attack Father 37    PHYSICAL EXAM: Vitals:   07/05/17 2100 07/05/17 2145  BP: 126/60 115/64  Pulse: 66 64  Resp: 14 20  Temp:      No intake or output data in the 24 hours ending 07/05/17 2225  General:  Well appearing. No respiratory difficulty HEENT: NCAT Neck: supple. no JVD. Carotids 2+ bilat; no bruits. No lymphadenopathy or thryomegaly appreciated. Cor: PMI nondisplaced. Regular rate & rhythm. No rubs, gallops or murmurs. Lungs: clear Abdomen: soft, nontender, nondistended. No hepatosplenomegaly. No bruits or masses. Good bowel sounds. Extremities: no cyanosis, clubbing, rash, edema Neuro: alert & oriented x 3, cranial nerves grossly intact. moves all 4 extremities w/o difficulty. Affect pleasant.  ECG: sinus rhythm, IVCD  Results for orders placed or performed during the hospital encounter of 07/05/17 (from the past 24 hour(s))  I-stat troponin, ED     Status: None     Collection Time: 07/05/17  8:58 PM  Result Value Ref Range   Troponin i, poc 0.04 0.00 - 0.08 ng/mL   Comment 3          CBC with Differential     Status: Abnormal   Collection Time: 07/05/17  9:00 PM  Result Value Ref Range   WBC 4.8 4.0 - 10.5 K/uL   RBC 4.11 (L) 4.22 - 5.81 MIL/uL   Hemoglobin 11.8 (L) 13.0 - 17.0 g/dL   HCT 36.9 (L) 39.0 - 52.0 %   MCV 89.8 78.0 - 100.0 fL   MCH 28.7 26.0 - 34.0 pg   MCHC 32.0 30.0 - 36.0 g/dL   RDW 14.6 11.5 - 15.5 %   Platelets 170 150 - 400 K/uL   Neutrophils Relative % 56 %   Neutro Abs 2.7 1.7 - 7.7 K/uL   Lymphocytes Relative 27 %   Lymphs Abs 1.3 0.7 - 4.0 K/uL   Monocytes Relative 9 %   Monocytes Absolute 0.4 0.1 - 1.0 K/uL   Eosinophils Relative 7 %   Eosinophils Absolute 0.4 0.0 - 0.7 K/uL  Basophils Relative 1 %   Basophils Absolute 0.0 0.0 - 0.1 K/uL  Magnesium     Status: None   Collection Time: 07/05/17  9:00 PM  Result Value Ref Range   Magnesium 2.3 1.7 - 2.4 mg/dL  I-stat chem 8, ed     Status: Abnormal   Collection Time: 07/05/17  9:05 PM  Result Value Ref Range   Sodium 139 135 - 145 mmol/L   Potassium 4.1 3.5 - 5.1 mmol/L   Chloride 104 101 - 111 mmol/L   BUN 19 6 - 20 mg/dL   Creatinine, Ser 1.10 0.61 - 1.24 mg/dL   Glucose, Bld 320 (H) 65 - 99 mg/dL   Calcium, Ion 1.14 (L) 1.15 - 1.40 mmol/L   TCO2 22 0 - 100 mmol/L   Hemoglobin 13.3 13.0 - 17.0 g/dL   HCT 39.0 39.0 - 52.0 %   No results found.  ASSESSMENT/PLAN: Mr. Larch is a 76 yo man with PMH CAD s/p multiple PCI, ICM with EF 34-19%, chronic systolic and diastolic heart failure, Vfib cardiac arrest 02/2017 and 05/2017 s/p ICD, recent admission for VT storm s/p VT ablation 06/2017, PAF on Xarelto, HTN, HLD, DM2 who presents to ER after ICD shock. ICD interrogation shows three episodes of sustained VT and one shock.   Patient was already planned for outpatient R/LHC on 7/23.  -NPO after midnight -hold rivaroxaban -continue amiodarone load--on 400 mg BID  until 7/24, then 200 mg BID as per taper instructions -keep K>4 and Mg>2 -consider lidocaine drip if VT returns -continue carvedilol, digoxin, spironolactone,losartan, pravastatin -dose lasix PRN -hold glimepiride while inpatient, SSI, glucose checks -first troponin negative, will trend, would not be surprised at bump given ICD shock  Full code, discussed with patient and his wife. Patient has advanced directive/living will but not with him today.  Buford Dresser, MD, PhD, overnight cardiology provider

## 2017-07-06 ENCOUNTER — Other Ambulatory Visit: Payer: Self-pay

## 2017-07-06 ENCOUNTER — Ambulatory Visit (HOSPITAL_COMMUNITY): Admission: RE | Admit: 2017-07-06 | Payer: Medicare Other | Source: Ambulatory Visit | Admitting: Cardiology

## 2017-07-06 ENCOUNTER — Encounter (HOSPITAL_COMMUNITY): Payer: Self-pay | Admitting: Cardiology

## 2017-07-06 ENCOUNTER — Encounter (HOSPITAL_COMMUNITY)
Admission: EM | Disposition: A | Discharge status: Home / Self Care | Payer: Self-pay | Source: Home / Self Care | Attending: Cardiology

## 2017-07-06 ENCOUNTER — Telehealth: Payer: Self-pay | Admitting: *Deleted

## 2017-07-06 DIAGNOSIS — E119 Type 2 diabetes mellitus without complications: Secondary | ICD-10-CM

## 2017-07-06 DIAGNOSIS — I2510 Atherosclerotic heart disease of native coronary artery without angina pectoris: Secondary | ICD-10-CM

## 2017-07-06 HISTORY — PX: LEFT HEART CATH AND CORONARY ANGIOGRAPHY: CATH118249

## 2017-07-06 HISTORY — PX: CORONARY BALLOON ANGIOPLASTY: CATH118233

## 2017-07-06 LAB — BASIC METABOLIC PANEL
ANION GAP: 8 (ref 5–15)
BUN: 15 mg/dL (ref 6–20)
CALCIUM: 8.8 mg/dL — AB (ref 8.9–10.3)
CHLORIDE: 105 mmol/L (ref 101–111)
CO2: 27 mmol/L (ref 22–32)
CREATININE: 1.2 mg/dL (ref 0.61–1.24)
GFR calc Af Amer: >60 mL/min (ref >60)
GFR calc non Af Amer: 58 mL/min — ABNORMAL LOW (ref >60)
GLUCOSE: 241 mg/dL — AB (ref 65–99)
Potassium: 4.6 mmol/L (ref 3.5–5.1)
Sodium: 140 mmol/L (ref 135–145)

## 2017-07-06 LAB — MAGNESIUM: MAGNESIUM: 2.3 mg/dL (ref 1.7–2.4)

## 2017-07-06 LAB — GLUCOSE, CAPILLARY
GLUCOSE-CAPILLARY: 171 mg/dL — AB (ref 65–99)
GLUCOSE-CAPILLARY: 158 mg/dL — AB (ref 65–99)
Glucose-Capillary: 213 mg/dL — ABNORMAL HIGH (ref 65–99)
Glucose-Capillary: 239 mg/dL — ABNORMAL HIGH (ref 65–99)

## 2017-07-06 LAB — CBC
HCT: 37.5 % — ABNORMAL LOW (ref 39.0–52.0)
HEMOGLOBIN: 11.8 g/dL — AB (ref 13.0–17.0)
MCH: 28.3 pg (ref 26.0–34.0)
MCHC: 31.5 g/dL (ref 30.0–36.0)
MCV: 89.9 fL (ref 78.0–100.0)
Platelets: 159 10*3/uL (ref 150–400)
RBC: 4.17 MIL/uL — ABNORMAL LOW (ref 4.22–5.81)
RDW: 14.7 % (ref 11.5–15.5)
WBC: 4.8 10*3/uL (ref 4.0–10.5)

## 2017-07-06 LAB — POCT ACTIVATED CLOTTING TIME
Activated Clotting Time: 241 seconds
Activated Clotting Time: 274 seconds

## 2017-07-06 LAB — APTT: aPTT: 30 seconds (ref 24–36)

## 2017-07-06 LAB — TROPONIN I
TROPONIN I: 0.07 ng/mL — AB (ref <0.03)
TROPONIN I: 0.06 ng/mL — AB (ref <0.03)
Troponin I: 0.05 ng/mL (ref <0.03)

## 2017-07-06 LAB — PROTIME-INR
INR: 1.01
PROTHROMBIN TIME: 13.3 s (ref 11.4–15.2)

## 2017-07-06 SURGERY — LEFT HEART CATH AND CORONARY ANGIOGRAPHY
Anesthesia: LOCAL

## 2017-07-06 MED ORDER — MIDAZOLAM HCL 2 MG/2ML IJ SOLN
INTRAMUSCULAR | Status: AC
  Filled 2017-07-06: qty 2

## 2017-07-06 MED ORDER — SODIUM CHLORIDE 0.9 % IV SOLN: 250.0000 mL | INTRAVENOUS | Status: DC | PRN

## 2017-07-06 MED ORDER — LABETALOL HCL 5 MG/ML IV SOLN: 10.0000 mg | INTRAVENOUS | Status: AC | PRN

## 2017-07-06 MED ORDER — IOPAMIDOL (ISOVUE-370) INJECTION 76%
INTRAVENOUS | Status: AC
  Filled 2017-07-06: qty 100

## 2017-07-06 MED ORDER — SODIUM CHLORIDE 0.9% FLUSH: 3.0000 mL | Freq: Two times a day (BID) | INTRAVENOUS | Status: DC

## 2017-07-06 MED ORDER — TICAGRELOR 90 MG PO TABS
90.0000 mg | ORAL_TABLET | Freq: Two times a day (BID) | ORAL | Status: DC
  Administered 2017-07-06 – 2017-07-08 (×4): 90 mg via ORAL
  Filled 2017-07-06 (×4): qty 1

## 2017-07-06 MED ORDER — HEPARIN SODIUM (PORCINE) 1000 UNIT/ML IJ SOLN
INTRAMUSCULAR | Status: AC
  Filled 2017-07-06: qty 1

## 2017-07-06 MED ORDER — IOPAMIDOL (ISOVUE-370) INJECTION 76%
INTRAVENOUS | Status: DC | PRN
  Administered 2017-07-06: 110 mL via INTRAVENOUS

## 2017-07-06 MED ORDER — LIDOCAINE HCL (PF) 1 % IJ SOLN
INTRAMUSCULAR | Status: DC | PRN
  Administered 2017-07-06: 2 mL

## 2017-07-06 MED ORDER — INSULIN ASPART 100 UNIT/ML ~~LOC~~ SOLN
0.0000 [IU] | Freq: Three times a day (TID) | SUBCUTANEOUS | Status: DC
  Administered 2017-07-06: 2 [IU] via SUBCUTANEOUS
  Administered 2017-07-06: 3 [IU] via SUBCUTANEOUS
  Administered 2017-07-07 (×2): 2 [IU] via SUBCUTANEOUS
  Administered 2017-07-08: 5 [IU] via SUBCUTANEOUS
  Administered 2017-07-08: 2 [IU] via SUBCUTANEOUS

## 2017-07-06 MED ORDER — MIDAZOLAM HCL 2 MG/2ML IJ SOLN
INTRAMUSCULAR | Status: DC | PRN
  Administered 2017-07-06: 1 mg via INTRAVENOUS

## 2017-07-06 MED ORDER — RIVAROXABAN 15 MG PO TABS
15.0000 mg | ORAL_TABLET | Freq: Every day | ORAL | Status: DC
  Administered 2017-07-07 – 2017-07-08 (×2): 15 mg via ORAL
  Filled 2017-07-06 (×2): qty 1

## 2017-07-06 MED ORDER — SODIUM CHLORIDE 0.9 % IV SOLN
INTRAVENOUS | Status: DC
  Administered 2017-07-06: 10 mL via INTRAVENOUS

## 2017-07-06 MED ORDER — TICAGRELOR 90 MG PO TABS
ORAL_TABLET | ORAL | Status: AC
  Filled 2017-07-06: qty 1

## 2017-07-06 MED ORDER — AMIODARONE HCL 200 MG PO TABS
400.0000 mg | ORAL_TABLET | Freq: Two times a day (BID) | ORAL | Status: DC
  Administered 2017-07-06 – 2017-07-08 (×4): 400 mg via ORAL
  Filled 2017-07-06 (×4): qty 2

## 2017-07-06 MED ORDER — FENTANYL CITRATE (PF) 100 MCG/2ML IJ SOLN
INTRAMUSCULAR | Status: DC | PRN
  Administered 2017-07-06 (×2): 25 ug via INTRAVENOUS

## 2017-07-06 MED ORDER — VERAPAMIL HCL 2.5 MG/ML IV SOLN
INTRAVENOUS | Status: AC
  Filled 2017-07-06: qty 2

## 2017-07-06 MED ORDER — SODIUM CHLORIDE 0.9% FLUSH
3.0000 mL | Freq: Two times a day (BID) | INTRAVENOUS | Status: DC
  Administered 2017-07-06 – 2017-07-08 (×3): 3 mL via INTRAVENOUS

## 2017-07-06 MED ORDER — GLIMEPIRIDE 2 MG PO TABS
2.0000 mg | ORAL_TABLET | Freq: Every day | ORAL | Status: DC
  Administered 2017-07-06 – 2017-07-08 (×3): 2 mg via ORAL
  Filled 2017-07-06 (×4): qty 1

## 2017-07-06 MED ORDER — ASPIRIN 81 MG PO CHEW: 81.0000 mg | CHEWABLE_TABLET | ORAL | Status: DC

## 2017-07-06 MED ORDER — FENTANYL CITRATE (PF) 100 MCG/2ML IJ SOLN
INTRAMUSCULAR | Status: AC
  Filled 2017-07-06: qty 2

## 2017-07-06 MED ORDER — INSULIN ASPART 100 UNIT/ML ~~LOC~~ SOLN
0.0000 [IU] | Freq: Every day | SUBCUTANEOUS | Status: DC
  Administered 2017-07-07: 2 [IU] via SUBCUTANEOUS

## 2017-07-06 MED ORDER — SODIUM CHLORIDE 0.9% FLUSH
3.0000 mL | INTRAVENOUS | Status: DC | PRN
Start: 2017-07-06 — End: 2017-07-08

## 2017-07-06 MED ORDER — VERAPAMIL HCL 2.5 MG/ML IV SOLN
INTRAVENOUS | Status: DC | PRN
  Administered 2017-07-06: 10 mL via INTRA_ARTERIAL

## 2017-07-06 MED ORDER — ALPRAZOLAM 0.25 MG PO TABS
0.2500 mg | ORAL_TABLET | Freq: Every evening | ORAL | Status: DC | PRN
  Administered 2017-07-06: 0.25 mg via ORAL
  Filled 2017-07-06: qty 1

## 2017-07-06 MED ORDER — DIPHENHYDRAMINE HCL 25 MG PO CAPS
25.0000 mg | ORAL_CAPSULE | Freq: Every day | ORAL | Status: DC
  Administered 2017-07-06 – 2017-07-07 (×3): 25 mg via ORAL
  Filled 2017-07-06 (×3): qty 1

## 2017-07-06 MED ORDER — HEPARIN (PORCINE) IN NACL 2-0.9 UNIT/ML-% IJ SOLN
INTRAMUSCULAR | Status: AC
  Filled 2017-07-06: qty 1000

## 2017-07-06 MED ORDER — HEPARIN (PORCINE) IN NACL 2-0.9 UNIT/ML-% IJ SOLN
INTRAMUSCULAR | Status: AC | PRN
  Administered 2017-07-06: 1000 mL

## 2017-07-06 MED ORDER — HYDRALAZINE HCL 20 MG/ML IJ SOLN: 5.0000 mg | INTRAMUSCULAR | Status: AC | PRN

## 2017-07-06 MED ORDER — SODIUM CHLORIDE 0.9% FLUSH: 3.0000 mL | INTRAVENOUS | Status: DC | PRN

## 2017-07-06 MED ORDER — FENTANYL CITRATE (PF) 100 MCG/2ML IJ SOLN
INTRAMUSCULAR | Status: DC | PRN
  Administered 2017-07-06: 25 ug via INTRAVENOUS

## 2017-07-06 MED ORDER — HEPARIN SODIUM (PORCINE) 1000 UNIT/ML IJ SOLN
INTRAMUSCULAR | Status: DC | PRN
  Administered 2017-07-06 (×2): 5000 [IU] via INTRAVENOUS

## 2017-07-06 MED ORDER — HEPARIN SODIUM (PORCINE) 1000 UNIT/ML IJ SOLN
INTRAMUSCULAR | Status: DC | PRN
  Administered 2017-07-06: 4000 [IU] via INTRAVENOUS

## 2017-07-06 MED ORDER — MIDAZOLAM HCL 2 MG/2ML IJ SOLN
INTRAMUSCULAR | Status: DC | PRN
  Administered 2017-07-06 (×2): 1 mg via INTRAVENOUS

## 2017-07-06 MED ORDER — TICAGRELOR 90 MG PO TABS
ORAL_TABLET | ORAL | Status: DC | PRN
  Administered 2017-07-06: 180 mg via ORAL

## 2017-07-06 MED ORDER — NITROGLYCERIN 1 MG/10 ML FOR IR/CATH LAB
INTRA_ARTERIAL | Status: AC
  Filled 2017-07-06: qty 10

## 2017-07-06 MED ORDER — ACETAMINOPHEN 500 MG PO TABS
500.0000 mg | ORAL_TABLET | Freq: Every day | ORAL | Status: DC
  Administered 2017-07-06 – 2017-07-07 (×3): 500 mg via ORAL
  Filled 2017-07-06 (×3): qty 1

## 2017-07-06 MED ORDER — LIDOCAINE HCL (PF) 1 % IJ SOLN
INTRAMUSCULAR | Status: AC
  Filled 2017-07-06: qty 30

## 2017-07-06 SURGICAL SUPPLY — 15 items
BALLN WOLVERINE 3.50X10 (BALLOONS) ×2
BALLOON WOLVERINE 3.50X10 (BALLOONS) IMPLANT
CATH 5FR JL3.5 JR4 ANG PIG MP (CATHETERS) ×1 IMPLANT
CATH LAUNCHER 6FR EBU3.5 (CATHETERS) ×1 IMPLANT
DEVICE RAD COMP TR BAND LRG (VASCULAR PRODUCTS) ×1 IMPLANT
GLIDESHEATH SLEND SS 6F .021 (SHEATH) ×1 IMPLANT
GUIDEWIRE INQWIRE 1.5J.035X260 (WIRE) IMPLANT
INQWIRE 1.5J .035X260CM (WIRE) ×2
KIT ENCORE 26 ADVANTAGE (KITS) ×1 IMPLANT
KIT HEART LEFT (KITS) ×2 IMPLANT
PACK CARDIAC CATHETERIZATION (CUSTOM PROCEDURE TRAY) ×2 IMPLANT
SHEATH PINNACLE 7F 10CM (SHEATH) IMPLANT
TRANSDUCER W/STOPCOCK (MISCELLANEOUS) ×2 IMPLANT
TUBING CIL FLEX 10 FLL-RA (TUBING) ×2 IMPLANT
WIRE COUGAR XT STRL 190CM (WIRE) ×1 IMPLANT

## 2017-07-06 NOTE — Consult Note (Signed)
ELECTROPHYSIOLOGY CONSULT NOTE    Patient ID: [name], DOB/AGE: 1941/10/18 76 y.o.  Admit date: 07/05/2017 Date of Consult: 07/06/2017  Primary Physician: [name], MD Primary Cardiologist: Aundra Dubin Electrophysiologist: Lovena Le  Reason for Consultation: VT  HPI:  Wayne [name] is a 76 y.o. male is referred by Dr Aundra Dubin for evaluation of recurrent amiodarone refractory VT.  Past medical history is notable for prior cardiac arrest s/p ICD implant, CAD, ICM, hypertension, chronic systolic heart failure, and persistent atrial fibrillation. He was admitted earlier this month with VT storm and underwent ablation by Dr Lovena Le at that time. Extensive scar modification was performed along the inferior wall extending to the septum and the base of the heart.  He was discharged on amiodarone.  Yesterday, he was in his USOH when he became lightheaded and felt like he did with prior ICD shocks.  He laid down on the bed and his ICD went off once.  EMS was called and he was transported to [facility] for further evaluation. He was seen by Dr Aundra Dubin in the office last week with plans for [facility] this week.  Catheterization this morning demonstrated in stent restenosis that was intervened upon.  He currently denies chest pain, shortness of breath, LE edema, recent fevers, chills, nausea or vomiting.   Device interrogation demonstrated 3 episodes in the VF zone (all polymorphic), 2 terminated with ATP before charging, 1 failed ATP termination and terminated with HV shock. Device interrogation otherwise normal.   Past Medical History:  Diagnosis Date  . Adenocarcinoma of prostate ([place])    s/p seed implants  . Arthritis   . Cellulitis of left leg    a. 05/3874 complicated by septic shock  . Chronic combined systolic and diastolic CHF (congestive heart failure) ([place])   . Colon polyps   . CORONARY ATHEROSCLEROSIS NATIVE CORONARY ARTERY    a. 01/2011 Cath/PCI: LM nl, LAD 40-50p, D1 80-small,  LCX 95-small, RI 90, RCA 100, EF 20%;  b. 01/2011 Card MRI - No transmural scar;  c. 01/2011 PCI RCA->5 Promus DES, RI->3.0x16 Promus DES; d. Cath 01/13/13 patent LAD & Ramus, diffuse LCx dz, RCA mult overlapping stents w/ 95% osital stenosis, EF 20%, s/p DES to ostial/prox RCA 01/24/13   . Diabetes mellitus, type 2 ([place])   . GERD (gastroesophageal reflux disease)   . Hematoma of leg    a. left leg hematoma 03/2012 in the setting of asa/effient  . Herpes zoster ophthalmicus   . HYPERLIPIDEMIA    intolerant to Lipitor (myalgias)  . HYPERTENSION   . Ischemic cardiomyopathy    a. 01/2012 Echo EF 45%, mild LVH; b. IE33%, grade 1 diastolic dysfunction, diffuse hypokinesis, inferoposterior akinesis   . Noncompliance   . Obesity   . OSA (obstructive sleep apnea)   . Polymyalgia rheumatica ([place])      Surgical History:  Past Surgical History:  Procedure Laterality Date  . CARDIAC CATHETERIZATION  01/24/2013  . CARDIAC CATHETERIZATION N/A 11/14/2016   Procedure: Right/Left Heart Cath and Coronary Angiography;  Surgeon: Larey Dresser, MD;  Location: [place] [facility];  Service: Cardiovascular;  Laterality: N/A;  . CARDIAC CATHETERIZATION N/A 11/17/2016   Procedure: Coronary Stent Intervention w/Impella;  Surgeon: Peter M Martinique, MD;  Location: [place] [facility];  Service: Cardiovascular;  Laterality: N/A;  . CARDIAC CATHETERIZATION N/A 11/17/2016   Procedure: Coronary Atherectomy;  Surgeon: Peter M Martinique, MD;  Location: [place] [facility];  Service: Cardiovascular;  Laterality: N/A;  .  CORONARY ANGIOPLASTY WITH STENT PLACEMENT  01/24/2013   DES to RCA  . CORONARY STENT PLACEMENT  2012   reports 6 stents placed  . I&D EXTREMITY  06/15/2012   Procedure: IRRIGATION AND DEBRIDEMENT EXTREMITY;  Surgeon: Newt Minion, MD;  Location: [place];  Service: Orthopedics;  Laterality: Left;  I&D Left Posterior Knee  . I&D EXTREMITY  06/30/2012   Procedure: IRRIGATION AND DEBRIDEMENT EXTREMITY;  Surgeon: Newt Minion, MD;  Location: [place];  Service: Orthopedics;  Laterality: Left;  Left Leg Irrigation and Debridement and placement of Wound VAC and application of  A-cell  . I&D EXTREMITY  07/20/2012   Procedure: IRRIGATION AND DEBRIDEMENT EXTREMITY;  Surgeon: Newt Minion, MD;  Location: [place];  Service: Orthopedics;  Laterality: Left;  Irrigation and Debridement Left Leg and Place antibiotic beads   . ICD IMPLANT N/A 02/12/2017   Procedure: ICD Implant;  Surgeon: Evans Lance, MD;  Location: [place] [facility];  Service: Cardiovascular;  Laterality: N/A;  . LEAD REVISION/REPAIR N/A 05/26/2017   Procedure: Lead Revision/Repair;  Surgeon: Evans Lance, MD;  Location: [place] [facility];  Service: Cardiovascular;  Laterality: N/A;  . PERCUTANEOUS CORONARY STENT INTERVENTION (PCI-S) N/A 01/24/2013   Procedure: PERCUTANEOUS CORONARY STENT INTERVENTION (PCI-S);  Surgeon: Wellington Hampshire, MD;  Location: [facility] C[facility] LAB;  Service: Cardiovascular;  Laterality: N/A;  . PROSTATE SURGERY     cancer, seed implant  . ULTRASOUND GUIDANCE FOR VASCULAR ACCESS  11/14/2016   Procedure: Ultrasound Guidance For Vascular Access;  Surgeon: Larey Dresser, MD;  Location: [place] [facility];  Service: Cardiovascular;;  . Stephanie Coup ABLATION N/A 06/19/2017   Procedure: Stephanie Coup Ablation;  Surgeon: Evans Lance, MD;  Location: [place] [facility];  Service: Cardiovascular;  Laterality: N/A;     Prescriptions Prior to Admission  Medication Sig Dispense Refill Last Dose  . amiodarone (PACERONE) 200 MG tablet Take 400mg  (2 tablets) twice daily for 2 weeks then 200mg  (one tablet) twice daily for 2 weeks, then 200mg  (one tablet) daily. (Patient taking differently: Take 200 mg by mouth 2 (two) times daily. TO TAPER DOWN TO 200 MG ONCE A DAY THEREAFTER ON 07/07/17) 90 tablet 3 07/05/2017 at 1830  . carvedilol (COREG) 12.5 MG tablet Take 0.5 tablets (6.25 mg total) by mouth 2 (two) times daily. 60 tablet 6 07/05/2017 at 1830  . digoxin (LANOXIN)  0.125 MG tablet Take 0.5 tablets (0.0625 mg total) by mouth daily. 15 tablet 6 07/05/2017 at am  . diphenhydramine-acetaminophen (TYLENOL PM) 25-500 MG TABS tablet Take 1 tablet by mouth at bedtime.    PRN at PRN  . Evolocumab (REPATHA SURECLICK) 283 MG/ML SOAJ Inject 1 pen into the skin every 14 (fourteen) days.   06/27/2017 at am  . furosemide (LASIX) 20 MG tablet Take 2 tablets (40 mg total) by mouth daily. (Patient taking differently: Take 40 mg by mouth 2 (two) times daily. ) 30 tablet 6 07/03/2017 at pm  . glimepiride (AMARYL) 2 MG tablet TAKE 1 TABLET(2 MG) BY MOUTH DAILY BEFORE BREAKFAST 90 tablet 1 07/03/2017 at am  . losartan (COZAAR) 25 MG tablet Take 0.5 tablets (12.5 mg total) by mouth daily. 15 tablet 3 07/05/2017 at am  . Multiple Vitamins-Minerals (CENTRUM SILVER ADULT 50+) TABS Take 1 tablet by mouth daily.   07/05/2017 at am  . Omega-3 Fatty Acids (FISH OIL) 1000 MG CAPS Take 1,000 mg by mouth 2 (two) times daily.  07/05/2017 at 1600  . pravastatin (PRAVACHOL) 20 MG tablet Take 1 tablet (20 mg total) by mouth every evening. 30 tablet 2 07/04/2017 at pm  . spironolactone (ALDACTONE) 25 MG tablet Take 0.5 tablets (12.5 mg total) by mouth daily. 15 tablet 6 07/03/2017 at am  . traMADol (ULTRAM) 50 MG tablet Take 1 tablet (50 mg total) by mouth every 8 (eight) hours as needed for moderate pain. 20 tablet 0 07/04/2017 at pm  . Lancets (ACCU-CHEK SOFT TOUCH) lancets Check blood sugars 1-2 times per day. DX: E11.65 200 each 5 UNK at [facility]  . rivaroxaban (XARELTO) 20 MG TABS tablet Take 1 tablet (20 mg total) by mouth daily with supper. 30 tablet 6 ON HOLD at ON HOLD    Inpatient Medications: . [MAR Hold] diphenhydrAMINE  25 mg Oral QHS   And  . [MAR Hold] acetaminophen  500 mg Oral QHS  . [MAR Hold] amiodarone  400 mg Oral BID  . aspirin  81 mg Oral Pre-Cath  . [MAR Hold] carvedilol  6.25 mg Oral BID WC  . [MAR Hold] digoxin  0.0625 mg Oral Daily  . [MAR Hold] insulin aspart  0-5 Units  Subcutaneous QHS  . [MAR Hold] insulin aspart  0-9 Units Subcutaneous TID WC  . [MAR Hold] losartan  12.5 mg Oral Daily  . [MAR Hold] pravastatin  20 mg Oral QPM  . [MAR Hold] sodium chloride flush  3 mL Intravenous Q12H  . sodium chloride flush  3 mL Intravenous Q12H  . [MAR Hold] spironolactone  12.5 mg Oral Daily    Allergies:  Allergies  Allergen Reactions  . Crestor [Rosuvastatin Calcium] Other (See Comments)    Myalgia, Interfering with Gait  . Entresto [Sacubitril-Valsartan] Swelling and Other (See Comments)    Angioedema  . Plavix [Clopidogrel] Itching, Swelling and Other (See Comments)    Nose bleeds and welts on legs & back  . Lipitor [Atorvastatin] Other (See Comments)    Makes legs sore  . Ancef [Cefazolin] Itching and Rash    Describes itching and rash, but said that "it wasn't that bad"    Social History   Social History  . Marital status: Married    Spouse name: N/A  . Number of children: 1  . Years of education: N/A   Occupational History  . 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 Smoking status: Former Smoker    Packs/day: 0.30    Years: 20.00    Types: Cigarettes    Quit date: 02/23/1989  . Smokeless tobacco: Never Used  . Alcohol use No  . Drug use: No  . Sexual activity: Not on file   Other Topics Concern  . Not on file   Social History Narrative   Retired Administrator     Family History  Problem Relation Age of Onset  . Cancer Mother 73       unknown CA  . Heart disease Father   . Alcohol abuse Father   . Heart attack Father 11     Review of Systems: All other systems reviewed and are otherwise negative except as noted above.  Physical Exam: Vitals:   07/06/17 0600 07/06/17 0700 07/06/17 0800 07/06/17 0846  BP: 134/66  133/60   Pulse: 60  62   Resp: 16  16   Temp:  98.5 F (36.9 C)    TempSrc:  Oral    SpO2: 96%  94% 99%  Weight:  Height:        GEN- The patient is well appearing, alert and  oriented x 3 today.   HEENT: normocephalic, atraumatic; sclera clear, conjunctiva pink; hearing intact; oropharynx clear; neck supple  Lungs- Clear to ausculation bilaterally, normal work of breathing.  No wheezes, rales, rhonchi Heart- Regular rate and rhythm, no murmurs, rubs or gallops  GI- soft, non-tender, non-distended, bowel sounds present  Extremities- no clubbing, cyanosis, or edema; DP/PT/radial pulses 2+ bilaterally MS- no significant deformity or atrophy Skin- warm and dry, no rash or lesion Psych- euthymic mood, full affect Neuro- strength and sensation are intact  Labs:  Lab Results  Component Value Date   WBC 4.8 07/06/2017   HGB 11.8 (L) 07/06/2017   HCT 37.5 (L) 07/06/2017   MCV 89.9 07/06/2017   PLT 159 07/06/2017    Recent Labs Lab 07/06/17 0533  NA 140  K 4.6  CL 105  CO2 27  BUN 15  CREATININE 1.20  CALCIUM 8.8*  GLUCOSE 241*     AST:MHDQQ rhythm, rate 71 (personally reviewed)  TELEMETRY: sinus rhythm (personally reviewed)  DEVICE HISTORY: MDT single chamber ICD implanted 02/2017 for secondary prevention; RV lead revision 05/2017 for undersensing of VF  Assessment/Plan: 1.  Recurrent drug refractory VT The patient has recurrent polymorphic VT/VF despite amiodarone therapy and recent VT ablation Cath this morning demonstrated in stent restenosis that was successfully intervened upon Continue amiodarone at 200mg  bid. If recurrent VT despite revascularization, consider adding Mexiletine.  Keep K >3.9, Mg >1.8 No driving x6 months  2.  CAD No recent ischemic symptoms Continue medical therapy  3.  HTN Stable No change required today  4.  Persistent atrial fibrillation Maintaining SR Resume Xarelto when ok after cath for [facility] of 4    Signed, Chanetta Marshall, NP 07/06/2017 8:59 AM  EP Attending  Patient seen and examined. Agree with above. He underwent VT ablation by me a few weeks ago after presenting with VT storm. After encircling  lesions placed during the procedure, he had no inducible VT with up to triple extra stimuli. He was found to have a culprit lesion and has undergone successful PCI. He looks well and his exam is notable for a well appearing man with a RRR, clear lungs, no edema and a normal neuro exam. His device was interrogated and reviewed by me. He will continue his amiodarone 200 bid and xarelto and other CHF meds.   Mikle Bosworth.D.

## 2017-07-06 NOTE — Progress Notes (Addendum)
Inpatient Diabetes Program Recommendations  AACE/ADA: New Consensus Statement on Inpatient Glycemic Control (2015)  Target Ranges:  Prepandial:   less than 140 mg/dL      Peak postprandial:   less than 180 mg/dL (1-2 hours)      Critically ill patients:  140 - 180 mg/dL   Results for BONIFACE, GOFFE (MRN [id number]) as of 07/06/2017 11:39  Ref. Range 07/06/2017 07:49  Glucose-Capillary Latest Ref Range: 65 - 99 mg/dL 213 (H)   Results for OAKLEY, KOSSMAN (MRN [id number]) as of 07/06/2017 11:39  Ref. Range 05/22/2017 19:28 06/20/2017 10:07  Hemoglobin A1C Latest Ref Range: 4.8 - 5.6 % 8.9 (H) 9.0 (H)   Review of Glycemic Control  Diabetes history: DM2 Outpatient Diabetes medications: Amaryl 2 mg QAM Current orders for Inpatient glycemic control: Novolog 0-9 units TID with meals, Novolog 0-5 units QHS  Inpatient Diabetes Program Recommendations:  HgbA1C: A1C 9% on 06/20/17 indicating an average glucose of 212 mg/dl. Please consider increasing Amaryl to 2 mg BID as an outpatient. Recommend patient follow up with PCP or Endocrinologist regarding DM control.  Note:Spoke with patient about diabetes and home regimen for diabetes control. Patient reports that he is followed by PCP for diabetes management and currently he takes Amaryl 2 mg QAM as an outpatient for diabetes control. Patient reports that he is taking Amarly as prescribed.  Patient states that he checks his glucose 1 time per day (fasting) and it is usually in the 200's mg/dl.  Discussed most current A1C results in the chart (9.0% on 06/20/17) and explained that his current A1C indicates an average glucose of 212 mg/dl over the past 2-3 months. Discussed glucose and A1C goals. Discussed importance of checking CBGs and maintaining good CBG control to prevent long-term and short-term complications. Explained how hyperglycemia leads to damage within blood vessels which lead to the common complications seen with uncontrolled diabetes. Stressed to the  patient the importance of improving glycemic control to prevent further complications from uncontrolled diabetes. Patient reports that he has neuropathy in his hands and feet that comes and goes. Encouraged patient make sure his PCP is aware of neuropathy.  Discussed impact of nutrition, exercise, stress, sickness, and medications on diabetes control. Patient reports that he had done a 2 week trial on his own in which he increased his Amaryl to 2 mg BID and his glucose improved significantly (reports fasting in low to mid 100's mg/dl). However, he did not continue because he did not have enough medication to take it BID. Inquired about whether he talked with his PCP about increasing the Amaryl and he states that he did not talk to his PCP about it. Patient would like to get DM better controlled and is interested in taking the Amaryl 2 mg BID as an outpatient. If Amaryl is increased as recommended, patient will need prescription to ensure he has enough medication to take. Encouraged patient to continue checking his glucose and to make sure he talks with his PCP about glycemic control. Also discussed hypoglycemia due to risk of hypoglycemia with Amaryl. Explained how Amaryl works, discussed hypoglycemia in detail along with proper treatment. Patient verbalized understanding of information discussed and he states that he has no further questions at this time related to diabetes.  Thanks, Barnie Alderman, RN, MSN, CDE Diabetes Coordinator Inpatient Diabetes Program [phone] (Team Pager from 8am to 5pm)

## 2017-07-06 NOTE — Interval H&P Note (Signed)
History and Physical Interval Note:  07/06/2017 8:43 AM  Wayne [name]  has presented today for surgery, with the diagnosis of hf  The various methods of treatment have been discussed with the patient and family. After consideration of risks, benefits and other options for treatment, the patient has consented to  Procedure(s): Right/Left Heart Cath and Coronary Angiography (N/A) as a surgical intervention .  The patient's history has been reviewed, patient examined, no change in status, stable for surgery.  I have reviewed the patient's chart and labs.  Questions were answered to the patient's satisfaction.     [name] [employer]

## 2017-07-06 NOTE — Progress Notes (Signed)
Pt admitted from E.D. via hospital stretcher w/ monitor and RN; Pt assisted to hospital bed and attached to unit monitors; Pt introduced to staff and unit and 6 cloth CHG bath complete. Questions answered and support give, Pt's family brought to bedside for update.

## 2017-07-06 NOTE — Care Management Note (Addendum)
Case Management Note  Patient Details  Name: KALAB CAMPS MRN: [id number] Date of Birth: 1941/05/10  Subjective/Objective:   From home with wife, pta indep, s/p Coronary Balloon Angioplasty. He states he gets brilinta free, all he has to do is call in to order it and he has some at home but it is half a bottle, he states he will have to call in and they will have to send it to him, not sure how long it will take to get these, he will be for dc probably tomorrow.  NCM will see if can get samples for patient.  Since he is a Medicare patient and he has already used the 30 day free, he is unable to use the 30 day free . Patient's wife will go to [place] [facility]. (Cards office)  to get  2 week samples of brilinta on 3rd floor.   7/25 [place], BSN - he is for discharge today, they have the [place] samples waiting  For him to pick up at the [facility] on [address].                    Action/Plan: NCM will follow for dc needs.   Expected Discharge Date:  07/08/17               Expected Discharge Plan:  Home/Self Care  In-House Referral:     Discharge planning Services  CM Consult  Post Acute Care Choice:    Choice offered to:     DME Arranged:    DME Agency:     HH Arranged:    HH Agency:     Status of Service:  Completed, signed off  If discussed at [employer] of Stay Meetings, dates discussed:    Additional Comments:  Zenon Mayo, RN 07/06/2017, 3:53 PM

## 2017-07-06 NOTE — Telephone Encounter (Signed)
[facility] hospital called requesting samples of Brilinta 90 mg for pt. I am leaving a 2 weeks supply for the pt to pick up at the front desk.

## 2017-07-07 DIAGNOSIS — I5022 Chronic systolic (congestive) heart failure: Secondary | ICD-10-CM

## 2017-07-07 LAB — BASIC METABOLIC PANEL
ANION GAP: 8 (ref 5–15)
Anion gap: 6 (ref 5–15)
BUN: 13 mg/dL (ref 6–20)
BUN: 13 mg/dL (ref 6–20)
CALCIUM: 8.9 mg/dL (ref 8.9–10.3)
CALCIUM: 9.3 mg/dL (ref 8.9–10.3)
CO2: 24 mmol/L (ref 22–32)
CO2: 25 mmol/L (ref 22–32)
CREATININE: 1.08 mg/dL (ref 0.61–1.24)
CREATININE: 1.25 mg/dL — AB (ref 0.61–1.24)
Chloride: 108 mmol/L (ref 101–111)
Chloride: 106 mmol/L (ref 101–111)
GFR calc Af Amer: >60 mL/min (ref >60)
GFR calc non Af Amer: 55 mL/min — ABNORMAL LOW (ref >60)
GLUCOSE: 96 mg/dL (ref 65–99)
Glucose, Bld: 254 mg/dL — ABNORMAL HIGH (ref 65–99)
Potassium: 4 mmol/L (ref 3.5–5.1)
Potassium: 5.2 mmol/L — ABNORMAL HIGH (ref 3.5–5.1)
SODIUM: 137 mmol/L (ref 135–145)
Sodium: 140 mmol/L (ref 135–145)

## 2017-07-07 LAB — GLUCOSE, CAPILLARY
GLUCOSE-CAPILLARY: 173 mg/dL — AB (ref 65–99)
GLUCOSE-CAPILLARY: 192 mg/dL — AB (ref 65–99)
GLUCOSE-CAPILLARY: 201 mg/dL — AB (ref 65–99)
Glucose-Capillary: 99 mg/dL (ref 65–99)

## 2017-07-07 LAB — CBC
HCT: 38.2 % — ABNORMAL LOW (ref 39.0–52.0)
Hemoglobin: 12.3 g/dL — ABNORMAL LOW (ref 13.0–17.0)
MCH: 28.4 pg (ref 26.0–34.0)
MCHC: 32.2 g/dL (ref 30.0–36.0)
MCV: 88.2 fL (ref 78.0–100.0)
PLATELETS: 158 10*3/uL (ref 150–400)
RBC: 4.33 MIL/uL (ref 4.22–5.81)
RDW: 14.4 % (ref 11.5–15.5)
WBC: 5.8 10*3/uL (ref 4.0–10.5)

## 2017-07-07 LAB — MAGNESIUM: Magnesium: 2.2 mg/dL (ref 1.7–2.4)

## 2017-07-07 LAB — DIGOXIN LEVEL: Digoxin Level: 0.2 ng/mL — ABNORMAL LOW (ref 0.8–2.0)

## 2017-07-07 MED ORDER — CARVEDILOL 12.5 MG PO TABS
12.5000 mg | ORAL_TABLET | Freq: Two times a day (BID) | ORAL | Status: DC
  Administered 2017-07-07 – 2017-07-08 (×2): 12.5 mg via ORAL
  Filled 2017-07-07 (×2): qty 1

## 2017-07-07 MED ORDER — FUROSEMIDE 40 MG PO TABS
40.0000 mg | ORAL_TABLET | Freq: Every day | ORAL | Status: DC
  Administered 2017-07-07 – 2017-07-08 (×2): 40 mg via ORAL
  Filled 2017-07-07 (×2): qty 1

## 2017-07-07 MED FILL — Nitroglycerin IV Soln 100 MCG/ML in D5W: INTRA_ARTERIAL | Qty: 10 | Status: AC

## 2017-07-07 NOTE — Progress Notes (Signed)
Electrophysiology Rounding Note  Patient Name: Wayne [name] Date of Encounter: 07/07/2017  Primary Cardiologist: Aundra Dubin Electrophysiologist: Lovena Le   Subjective   The patient is doing well today.  At this time, the patient denies chest pain, shortness of breath, or any new concerns. EP has been asked to see for recurrent VT this morning. He is anxious about recurrent ICD shocks.    Inpatient Medications    Scheduled Meds: . diphenhydrAMINE  25 mg Oral QHS   And  . acetaminophen  500 mg Oral QHS  . amiodarone  400 mg Oral BID  . carvedilol  6.25 mg Oral BID WC  . digoxin  0.0625 mg Oral Daily  . glimepiride  2 mg Oral Q breakfast  . insulin aspart  0-5 Units Subcutaneous QHS  . insulin aspart  0-9 Units Subcutaneous TID WC  . losartan  12.5 mg Oral Daily  . pravastatin  20 mg Oral QPM  . rivaroxaban  15 mg Oral Daily  . sodium chloride flush  3 mL Intravenous Q12H  . sodium chloride flush  3 mL Intravenous Q12H  . spironolactone  12.5 mg Oral Daily  . ticagrelor  90 mg Oral BID   Continuous Infusions: . sodium chloride     PRN Meds: sodium chloride, ALPRAZolam, sodium chloride flush   Vital Signs    Vitals:   07/07/17 0500 07/07/17 0600 07/07/17 0700 07/07/17 0800  BP: (!) 120/58 (!) 122/55 (!) 134/54 (!) 147/75  Pulse: (!) 58 (!) 57 60 65  Resp: 16 16 12  (!) 9  Temp:    97.7 F (36.5 C)  TempSrc:      SpO2: 97% 96% 96% 97%  Weight:      Height:        Intake/Output Summary (Last 24 hours) at 07/07/17 1130 Last data filed at 07/07/17 0446  Gross per 24 hour  Intake              240 ml  Output             1450 ml  Net            -1210 ml   Filed Weights   07/05/17 2101  Weight: 228 lb (103.4 kg)    Physical Exam    GEN- The patient is well appearing, alert and oriented x 3 today.   Head- normocephalic, atraumatic Eyes-  Sclera clear, conjunctiva pink Ears- hearing intact Oropharynx- clear Neck- supple Lungs- Clear to ausculation bilaterally,  normal work of breathing Heart- Regular rate and rhythm  GI- soft, NT, ND, + BS Extremities- no clubbing, cyanosis, or edema Skin- no rash or lesion Psych- euthymic mood, full affect Neuro- strength and sensation are intact  Labs    CBC  Recent Labs  07/05/17 2100  07/06/17 0533 07/07/17 0250  WBC 4.8  --  4.8 5.8  NEUTROABS 2.7  --   --   --   HGB 11.8*  < > 11.8* 12.3*  HCT 36.9*  < > 37.5* 38.2*  MCV 89.8  --  89.9 88.2  PLT 170  --  159 158  < > = values in this interval not displayed. Basic Metabolic Panel  Recent Labs  07/05/17 2100  07/06/17 0533 07/07/17 0250  NA  --   < > 140 140  K  --   < > 4.6 4.0  CL  --   < > 105 108  CO2  --   --  27 24  GLUCOSE  --   < > 241* 96  BUN  --   < > 15 13  CREATININE  --   < > 1.20 1.08  CALCIUM  --   --  8.8* 8.9  MG 2.3  --  2.3  --   < > = values in this interval not displayed. Cardiac Enzymes  Recent Labs  07/06/17 0533 07/06/17 1126 07/06/17 1723  TROPONINI 0.07* 0.06* 0.05*     Telemetry    Sinus rhythm with NSVT (personally reviewed)  Radiology    No results found.   Patient Profile     Wayne [name] is a 76 y.o. male with a past medical history significant for prior cardiac arrest s/p ICD implant, CAD, ICM, hypertension, chronic systolic heart failure, and persistent atrial fibrillation.  He was admitted for appropriate ICD therapy.  Catheterization demonstrated in stent restenosis of proximal LAD and he underwent cutting balloon angioplasty yesterday.   Assessment & Plan    1.  Ventricular tachycardia  Refractory to amiodarone Revascularization yesterday of prox LAD He did have recurrent NSVT this morning. For now, would continue higher dose of amiodarone and follow. Increase Coreg as able (increased to 12.5mg  twice daily today)   If recurrent VT or if increase in frequency, would add Mexiletine.  Keep K >3.9, Mg >1.8 No driving x6 months  2.  CAD S/p revascularization yesterday Per  primary team  3.  HTN Stable No change required today  4.  Persistent atrial fibrillation Maintaining SR Continue Xarelto long term for CHADS2VASC of 4   Signed, Chanetta Marshall, NP  07/07/2017, 11:30 AM   I have seen, examined the patient, and reviewed the above assessment and plan.  Changes to above are made where necessary.  On exam, RRR.  Pt with symptomatic NSVT despite amiodarone therapy.  Will titrate coreg as able.   Co Sign: Thompson Grayer, MD 07/07/2017 5:26 PM

## 2017-07-07 NOTE — Progress Notes (Signed)
CARDIAC REHAB PHASE I   PRE:  Rate/Rhythm: 38 SR  BP:  Sitting: 131/70        SaO2: 98 RA  MODE:  Ambulation: 260 ft   POST:  Rate/Rhythm: 76 SR  BP:  Sitting: 113/76         SaO2: 98 RA  Pt ambulated 260 ft on RA, handheld assist, steady gait, tolerated fair. Pt had vtach during ambulation, c/o lightheadedness, returned to room, RN notified, VSS. Completed PCI education. Reviewed risk factors, anti-platelet therapy, activity restrictions, ntg, exercise, heart healthy and diabetes diet handouts, s/s chf, sodium restrictions, daily weights and phase 2 cardiac rehab. Pt verbalized understanding, receptive to education. Pt agrees to phase 2 cardiac rehab referral, will send to [facility]. Pt to recliner after walk, call bell within reach. Will follow.   [id number] Lenna Sciara, RN, BSN 07/07/2017 11:14 AM

## 2017-07-07 NOTE — Progress Notes (Signed)
Advanced Heart Failure Rounding Note  Primary Cardiologist: Dr. Aundra Dubin   Subjective:    Feeling good this morning. Denies lightheadedness or dizziness. No chest pain. Hasn't been up walking around yet.   LHC 07/06/17 1. Chronic occlusion of RCA with left to right collaterals 2. Subtotal occlusion of small AV LCx (no change) 3. 90% in-stent restenosis in the proximal LAD within a stent placed in 12/17. Focal lesion.  LHC 07/06/17 PCI - Prox LAD lesion, 90% stenosed -> post intervention, there is 0% residual stenosis - s/p cutting balloon angioplasty.  -> Brilinta x one month as tolerated.  Objective:   Weight Range: 228 lb (103.4 kg) Body mass index is 34.67 kg/m.   Vital Signs:   Temp:  [97.6 F (36.4 C)-98.5 F (36.9 C)] 97.7 F (36.5 C) (07/24 0800) Pulse Rate:  [56-67] 65 (07/24 0800) Resp:  [8-22] 9 (07/24 0800) BP: (101-154)/(43-96) 147/75 (07/24 0800) SpO2:  [0 %-100 %] 97 % (07/24 0800) Last BM Date: 07/05/17  Weight change: Filed Weights   07/05/17 2101  Weight: 228 lb (103.4 kg)    Intake/Output:   Intake/Output Summary (Last 24 hours) at 07/07/17 0830 Last data filed at 07/07/17 0446  Gross per 24 hour  Intake              268 ml  Output             1550 ml  Net            -1282 ml      Physical Exam    General:  Well appearing. No resp difficulty HEENT: Normal Neck: Supple. JVP 7-8. Carotids 2+ bilat; no bruits. No lymphadenopathy or thyromegaly appreciated. Cor: PMI nondisplaced. Regular rate & rhythm. No rubs, gallops or murmurs. Lungs: Clear Abdomen: Soft, nontender, nondistended. No hepatosplenomegaly. No bruits or masses. Good bowel sounds. Extremities: No cyanosis, clubbing, rash, edema Neuro: Alert & orientedx3, cranial nerves grossly intact. moves all 4 extremities w/o difficulty. Affect pleasant  Telemetry   Personally reviewed, NSR 70s  EKG    Personally reviewed, 07/07/17 NSR 62 bpm  Labs    CBC  Recent Labs  07/05/17 2100  07/06/17 0533 07/07/17 0250  WBC 4.8  --  4.8 5.8  NEUTROABS 2.7  --   --   --   HGB 11.8*  < > 11.8* 12.3*  HCT 36.9*  < > 37.5* 38.2*  MCV 89.8  --  89.9 88.2  PLT 170  --  159 158  < > = values in this interval not displayed. Basic Metabolic Panel  Recent Labs  07/05/17 2100  07/06/17 0533 07/07/17 0250  NA  --   < > 140 140  K  --   < > 4.6 4.0  CL  --   < > 105 108  CO2  --   --  27 24  GLUCOSE  --   < > 241* 96  BUN  --   < > 15 13  CREATININE  --   < > 1.20 1.08  CALCIUM  --   --  8.8* 8.9  MG 2.3  --  2.3  --   < > = values in this interval not displayed. Liver Function Tests No results for input(s): AST, ALT, ALKPHOS, BILITOT, PROT, ALBUMIN in the last 72 hours. No results for input(s): LIPASE, AMYLASE in the last 72 hours. Cardiac Enzymes  Recent Labs  07/06/17 0533 07/06/17 1126 07/06/17 1723  TROPONINI 0.07* 0.06*  0.05*    BNP: BNP (last 3 results)  Recent Labs  11/17/16 0210 01/16/17 1318 06/10/17 0955  BNP 2,032.0* 363.7* 298.1*    ProBNP (last 3 results) No results for input(s): PROBNP in the last 8760 hours.   D-Dimer No results for input(s): DDIMER in the last 72 hours. Hemoglobin A1C No results for input(s): HGBA1C in the last 72 hours. Fasting Lipid Panel No results for input(s): CHOL, HDL, LDLCALC, TRIG, CHOLHDL, LDLDIRECT in the last 72 hours. Thyroid Function Tests No results for input(s): TSH, T4TOTAL, T3FREE, THYROIDAB in the last 72 hours.  Invalid input(s): FREET3  Other results:   Imaging     No results found.   Medications:     Scheduled Medications: . diphenhydrAMINE  25 mg Oral QHS   And  . acetaminophen  500 mg Oral QHS  . amiodarone  400 mg Oral BID  . carvedilol  6.25 mg Oral BID WC  . digoxin  0.0625 mg Oral Daily  . glimepiride  2 mg Oral Q breakfast  . insulin aspart  0-5 Units Subcutaneous QHS  . insulin aspart  0-9 Units Subcutaneous TID WC  . losartan  12.5 mg Oral Daily  .  pravastatin  20 mg Oral QPM  . rivaroxaban  15 mg Oral Daily  . sodium chloride flush  3 mL Intravenous Q12H  . sodium chloride flush  3 mL Intravenous Q12H  . spironolactone  12.5 mg Oral Daily  . ticagrelor  90 mg Oral BID     Infusions: . sodium chloride       PRN Medications:  sodium chloride, ALPRAZolam, sodium chloride flush    Patient Profile   Wayne [name] is a 76 y.o. male with PMH CAD s/p multiple PCI, ICM with EF 20-25%, chronic combined CHF, Vfib arrest 02/2017 and 05/2017 s/p ICD, recent admission for VT storm s/p VT ablation 06/2017, PAF on Xarelto, HTN, HLD, and DM2  Presented to ED 07/05/17 after ICD shock. Of note patient was scheduled for outpatient cath 07/06/17.  Assessment/Plan   1. Chronic systolic CHF. ICM. 3/18 echo with EF 20-25%, diffuse hypokinesis.  CPX in 10/17 with severe HF limitation.  RHC in 12/17 with elevated filling pressures and low cardiac output.  He is now s/p PCI to proximal to mid LAD with much improved symptoms.  Medtronic ICD in 3/18 after vfib arrest. He had VT again in 6/18, then VT storm in 7/18 now s/p VT ablation.   -  Feeling good this am. NYHA II. Encouraged ambulation this am.  - Continue spironolactone 12.5 daily.  - Continue losartan 12.5 mg daily. - Of note, patient had possible angioedema related to [facility].  Would not re-challenge.  - Continue coreg 6.25 mg BID.  - Continue digoxin 0.0625 daily, recent level was ok.     - He may be an LVAD candidate in the future.    2. CAD:   - Now s/p balloon angioplasty of the severe in-stent restonosis of the proximal LAD.  - Continue pravastatin and Repatha.   - Back on brilinta for one month as tolerated. - Now on Xarelto 20 mg daily only and OFF ticagrelor.   3. HLD:  - Continue Repatha and low dose pravastatin. No change.  4. PAD: Occluded peroneal arteries bilaterally.  - No significant claudication. No change.   5. DM2.  - Sliding scale.  6. Afib, Paroxysmal   -  Remains in NSR.  - Continue Xarelto 20 mg daily. - Continue  amiodarone 200 mg daily.    - Could eventually consider atrial fibrillation ablation eventually based on CASTLE-HF data if recurs. No change.   7. Ventricular fibrillation arrest: On amiodarone and has Medtronic ICD.  He had VT ablation in 7/18 after episode of VT storm.  - No driving until 2/75.   No change.   8. OSA - Not using CPAP.    Feeling good. Encouraged to walk unit. If stable may be able to go this afternoon.   Length of Stay: 2  Annamaria Helling  07/07/2017, 8:30 AM  Advanced Heart Failure Team Pager [phone] (M-F; 7a - 4p)  Please contact [facility] for night-coverage after hours (4p -7a ) and weekends on [email or web address]  Patient seen with PA, agree with the above note.    Later in the morning, Wayne [name] had 2 runs NSVT, felt palpitations. I discussed with EP => BP is stable, will increase Coreg to 12.5 mg bid and continue amiodarone 400 mg bid.  He just had PCI yesterday so would like to avoid additional anti-arrhythmic if possible.  If continues to have events, will add mexiletine.   Volume stable, continue losartan and spironolactone and will restart Lasix 40 mg daily.  Follow K and Mg.   Loralie Champagne 07/07/2017 12:11 PM

## 2017-07-07 NOTE — Progress Notes (Signed)
  Paged for recurrent VT. Approx 20 beats, symptomatic.  Send stat BMET/Mg.  Self-terminating. No shock.  Will ask EP to see today for possible additional arrhythmic therapy.   Legrand Como [id number] [place], PA-C 07/07/2017 11:24 AM

## 2017-07-07 NOTE — Progress Notes (Signed)
Patient with Vfib/tach while ambulating with Cardiac Rehab. Patient symptomatic with dizziness. MD paged. Patient back in room, sitting up in chair, NSR.  Will continue to monitor. Roselyn Reef Korina Tretter,RN

## 2017-07-08 LAB — BASIC METABOLIC PANEL
ANION GAP: 7 (ref 5–15)
BUN: 17 mg/dL (ref 6–20)
CHLORIDE: 106 mmol/L (ref 101–111)
CO2: 27 mmol/L (ref 22–32)
Calcium: 9.1 mg/dL (ref 8.9–10.3)
Creatinine, Ser: 1.44 mg/dL — ABNORMAL HIGH (ref 0.61–1.24)
GFR calc non Af Amer: 47 mL/min — ABNORMAL LOW (ref >60)
GFR, EST AFRICAN AMERICAN: 54 mL/min — AB (ref >60)
Glucose, Bld: 142 mg/dL — ABNORMAL HIGH (ref 65–99)
Potassium: 4.5 mmol/L (ref 3.5–5.1)
Sodium: 140 mmol/L (ref 135–145)

## 2017-07-08 LAB — MAGNESIUM: Magnesium: 2.3 mg/dL (ref 1.7–2.4)

## 2017-07-08 LAB — GLUCOSE, CAPILLARY
GLUCOSE-CAPILLARY: 166 mg/dL — AB (ref 65–99)
Glucose-Capillary: 293 mg/dL — ABNORMAL HIGH (ref 65–99)

## 2017-07-08 MED ORDER — RIVAROXABAN 15 MG PO TABS: 15.0000 mg | ORAL_TABLET | Freq: Every day | ORAL | 0 refills | Status: DC

## 2017-07-08 MED ORDER — FUROSEMIDE 20 MG PO TABS: 40.0000 mg | ORAL_TABLET | Freq: Every day | ORAL | 6 refills | Status: DC

## 2017-07-08 MED ORDER — AMIODARONE HCL 200 MG PO TABS: ORAL_TABLET | ORAL | 6 refills | Status: DC

## 2017-07-08 MED ORDER — TICAGRELOR 90 MG PO TABS: 90.0000 mg | ORAL_TABLET | Freq: Two times a day (BID) | ORAL | 0 refills | Status: DC

## 2017-07-08 MED ORDER — LOSARTAN POTASSIUM 25 MG PO TABS
12.5000 mg | ORAL_TABLET | Freq: Every day | ORAL | Status: DC
Start: 2017-07-09 — End: 2017-07-08

## 2017-07-08 MED ORDER — CARVEDILOL 12.5 MG PO TABS: 12.5000 mg | ORAL_TABLET | Freq: Two times a day (BID) | ORAL | 6 refills | Status: DC

## 2017-07-08 NOTE — Consult Note (Signed)
   [facility] Inpatient Consult   07/08/2017  Wayne [name] Wayne [name], Wayne [name] [id number]   Patient was assessed for [facility] and multiple admissions in the past 6 months.  Patient is a 76 year old with a PMH of CAD, EF 41-58% chronic systolic and diastolic HF, V Fib arrest x 2.  Spoke with inpatient RNCM, Neoma Laming regarding post hospital needs.  Patient currently seeking assistance with Brilinta.  Met with the patient at the bedside.  Patient states he knows to call the phone number on a paper he has at home for the assistance.  Encouraged him to call [facility] if he would like a [facility] pharmacist to follow up.  He states he feels like he will be fine but glad to have the information if he needs it.  He states he tries to get his MD appointments on Mondays because it's his wife's day off and she has a store in [place].  Patient expressed no [facility] needs at this time.  His primary care provider is Carolann Littler at [facility].  For questions, please contact:  Natividad Brood, RN BSN [facility] Liaison  [phone] business mobile phone Toll free office [phone]

## 2017-07-08 NOTE — Progress Notes (Signed)
CARDIAC REHAB PHASE I   PRE:  Rate/Rhythm: 68 SB  BP:  Supine: 130/61  Sitting  Standing:    SaO2: 95%RA  MODE:  Ambulation: 370 ft   POST:  Rate/Rhythm: 80 ST one pair PVCs  BP:  Supine:   Sitting: 136/70  Standing:    SaO2: 96%RA 0855-0915 Pt walked 370 ft on RA with rolling walker and minimal asst. One asst to follow with chair in case pt needed to sit. Pt a little weak upon standing but tolerated walk well. No runs of VT. To recliner and set up breakfast. Pt hopes to go home.   Graylon Good, RN BSN  07/08/2017 9:11 AM

## 2017-07-08 NOTE — Progress Notes (Signed)
Advanced Heart Failure Rounding Note  Primary Cardiologist: Dr. Aundra Dubin   Subjective:    Feeling good this morning. Wants to go home but anxious. No VT or NSVT overnight. Walking halls without dizziness or lightheadedness. No chest pain   LHC 07/06/17 1. Chronic occlusion of RCA with left to right collaterals 2. Subtotal occlusion of small AV LCx (no change) 3. 90% in-stent restenosis in the proximal LAD within a stent placed in 12/17. Focal lesion.  LHC 07/06/17 PCI - Prox LAD lesion, 90% stenosed -> post intervention, there is 0% residual stenosis - s/p cutting balloon angioplasty.  -> Brilinta x one month as tolerated.  Objective:   Weight Range: 228 lb (103.4 kg) Body mass index is 34.67 kg/m.   Vital Signs:   Temp:  [97.6 F (36.4 C)-98.6 F (37 C)] 97.6 F (36.4 C) (07/25 0743) Pulse Rate:  [57-66] 62 (07/25 0700) Resp:  [9-19] 19 (07/25 0700) BP: (92-147)/(48-75) 122/55 (07/25 0600) SpO2:  [91 %-98 %] 91 % (07/25 0700) Last BM Date: 07/05/17  Weight change: Filed Weights   07/05/17 2101  Weight: 228 lb (103.4 kg)    Intake/Output:   Intake/Output Summary (Last 24 hours) at 07/08/17 0753 Last data filed at 07/08/17 0300  Gross per 24 hour  Intake              180 ml  Output             3276 ml  Net            -3096 ml      Physical Exam    General: Well appearing. No resp difficulty. HEENT: Normal Neck: Supple. JVP 7-8. Carotids 2+ bilat; no bruits. No thyromegaly or nodule noted. Cor: PMI nondisplaced. RRR, No M/G/R noted Lungs: CTAB, normal effort. Abdomen: Soft, non-tender, non-distended, no HSM. No bruits or masses. +BS  Extremities: No cyanosis, clubbing, rash, R and LLE no edema.  Neuro: Alert & orientedx3, cranial nerves grossly intact. moves all 4 extremities w/o difficulty. Affect pleasant   Telemetry   Personally reviewed, NSR 70s. No further NSVT/VT overnight.  EKG    Personally reviewed, 07/07/17 NSR 62 bpm  Labs     CBC  Recent Labs  07/05/17 2100  07/06/17 0533 07/07/17 0250  WBC 4.8  --  4.8 5.8  NEUTROABS 2.7  --   --   --   HGB 11.8*  < > 11.8* 12.3*  HCT 36.9*  < > 37.5* 38.2*  MCV 89.8  --  89.9 88.2  PLT 170  --  159 158  < > = values in this interval not displayed. Basic Metabolic Panel  Recent Labs  07/07/17 1116 07/08/17 0444  NA 137 140  K 5.2* 4.5  CL 106 106  CO2 25 27  GLUCOSE 254* 142*  BUN 13 17  CREATININE 1.25* 1.44*  CALCIUM 9.3 9.1  MG 2.2 2.3   Liver Function Tests No results for input(s): AST, ALT, ALKPHOS, BILITOT, PROT, ALBUMIN in the last 72 hours. No results for input(s): LIPASE, AMYLASE in the last 72 hours. Cardiac Enzymes  Recent Labs  07/06/17 0533 07/06/17 1126 07/06/17 1723  TROPONINI 0.07* 0.06* 0.05*    BNP: BNP (last 3 results)  Recent Labs  11/17/16 0210 01/16/17 1318 06/10/17 0955  BNP 2,032.0* 363.7* 298.1*    ProBNP (last 3 results) No results for input(s): PROBNP in the last 8760 hours.   D-Dimer No results for input(s): DDIMER in the last  72 hours. Hemoglobin A1C No results for input(s): HGBA1C in the last 72 hours. Fasting Lipid Panel No results for input(s): CHOL, HDL, LDLCALC, TRIG, CHOLHDL, LDLDIRECT in the last 72 hours. Thyroid Function Tests No results for input(s): TSH, T4TOTAL, T3FREE, THYROIDAB in the last 72 hours.  Invalid input(s): FREET3  Other results:   Imaging    No results found.   Medications:     Scheduled Medications: . diphenhydrAMINE  25 mg Oral QHS   And  . acetaminophen  500 mg Oral QHS  . amiodarone  400 mg Oral BID  . carvedilol  12.5 mg Oral BID WC  . digoxin  0.0625 mg Oral Daily  . furosemide  40 mg Oral Daily  . glimepiride  2 mg Oral Q breakfast  . insulin aspart  0-5 Units Subcutaneous QHS  . insulin aspart  0-9 Units Subcutaneous TID WC  . losartan  12.5 mg Oral Daily  . pravastatin  20 mg Oral QPM  . rivaroxaban  15 mg Oral Daily  . sodium chloride flush  3  mL Intravenous Q12H  . sodium chloride flush  3 mL Intravenous Q12H  . spironolactone  12.5 mg Oral Daily  . ticagrelor  90 mg Oral BID    Infusions: . sodium chloride      PRN Medications: sodium chloride, ALPRAZolam, sodium chloride flush    Patient Profile   Wayne [name] is a 76 y.o. male with PMH CAD s/p multiple PCI, ICM with EF 20-25%, chronic combined CHF, Vfib arrest 02/2017 and 05/2017 s/p ICD, recent admission for VT storm s/p VT ablation 06/2017, PAF on Xarelto, HTN, HLD, and DM2  Presented to ED 07/05/17 after ICD shock. Of note patient was scheduled for outpatient cath 07/06/17.  Assessment/Plan   1. Chronic systolic CHF. ICM. 3/18 echo with EF 20-25%, diffuse hypokinesis.  CPX in 10/17 with severe HF limitation.  RHC in 12/17 with elevated filling pressures and low cardiac output.  He is now s/p PCI to proximal to mid LAD with much improved symptoms.  Medtronic ICD in 3/18 after vfib arrest. He had VT again in 6/18, then VT storm in 7/18 now s/p VT ablation.   - Feeling much better. No further VT. NYHA II-III.   - Continue spironolactone 12.5 daily.  - Continue losartan 12.5 mg daily. - Of note, patient had possible angioedema related to [facility].  Would not re-challenge.  - Continue coreg 12.5 BID  - Continue digoxin 0.0625 daily, recent level was ok.     - He may be an LVAD candidate in the future.  Will continue to follow for this as outpatient.   2. CAD:   - Now s/p balloon angioplasty of the severe in-stent restonosis of the proximal LAD.  - Continue pravastatin and Repatha.   - Back on brilinta for one month as tolerated. No change.   3. HLD:  - Continue Repatha and low dose pravastatin. No change.   4. PAD: Occluded peroneal arteries bilaterally.  - No significant claudication. No change.  5. DM2.  - Sliding scale. No change. Will resume home meds on discharge.   6. Afib, Paroxysmal   - Remains in NSR.  - Continue Xarelto 20 mg daily. - Continue  amiodarone 200 mg daily.    - Could eventually consider atrial fibrillation ablation eventually based on CASTLE-HF data if recurs. No change.   7. Ventricular fibrillation arrest: On amiodarone and has Medtronic ICD.  He had VT ablation in 7/18 after  episode of VT storm.  - No driving until 6/77.   No change.   8. OSA - Not using CPAP.  No change.   Feeling good. No further VT. Likely home today.   Length of Stay: 3  Wayne [name]  07/08/2017, 7:53 AM  Advanced Heart Failure Team Pager [phone] (M-F; 7a - 4p)  Please contact [facility] for night-coverage after hours (4p -7a ) and weekends on [email or web address]  Patient seen with PA, agree with the above note.  No further VT, feels good without dyspnea or chest pain.   He can go home today.  For VT, would continue higher Coreg and amiodarone 200 mg bid x 1 week then 200 mg daily.   For PCI, he will go home on Xarelto 15 mg daily + ticagrelor 90 mg bid x 1 month then stop ticagrelor and increase Xarelto to 20 mg daily.   Meds for home:  Coreg 12.5 mg bid Amiodarone 200 mg bid x 1 week then 200 mg daily.  Lasix 40 mg daily Losartan 12.5 mg daily Spironolactone 12.5 mg daily ticagrelor 90 mg bid x 1 month.  Xarelto 15 mg daily x 1 month then 20 mg daily Pravastatin 20 Repatha Digoxin 0.0625 daily  F/u EP and CHF clinic  Wayne [name] 07/08/2017 8:38 AM

## 2017-07-08 NOTE — Discharge Instructions (Signed)

## 2017-07-08 NOTE — Progress Notes (Signed)
[facility] for Rivaroxaban Indication: atrial fibrillation  Allergies  Allergen Reactions  . Crestor [Rosuvastatin Calcium] Other (See Comments)    Myalgia, Interfering with Gait  . Entresto [Sacubitril-Valsartan] Swelling and Other (See Comments)    Angioedema  . Plavix [Clopidogrel] Itching, Swelling and Other (See Comments)    Nose bleeds and welts on legs & back  . Lipitor [Atorvastatin] Other (See Comments)    Makes legs sore  . Ancef [Cefazolin] Itching and Rash    Describes itching and rash, but said that "it wasn't that bad"    Patient Measurements: Height: 5\' 8"  (172.7 cm) Weight: 228 lb (103.4 kg) IBW/kg (Calculated) : 68.4   Vital Signs: Temp: 98.5 F (36.9 C) (07/25 1132) Temp Source: Oral (07/25 1132) BP: 139/63 (07/25 1000) Pulse Rate: 75 (07/25 1000)  Labs:  Recent Labs  07/05/17 2100  07/05/17 2105 07/06/17 0533 07/06/17 1126 07/06/17 1723 07/07/17 0250 07/07/17 1116 07/08/17 0444  HGB 11.8*  --  13.3 11.8*  --   --  12.3*  --   --   HCT 36.9*  --  39.0 37.5*  --   --  38.2*  --   --   PLT 170  --   --  159  --   --  158  --   --   APTT  --   --   --  30  --   --   --   --   --   LABPROT  --   --   --  13.3  --   --   --   --   --   INR  --   --   --  1.01  --   --   --   --   --   CREATININE  --   < > 1.10 1.20  --   --  1.08 1.25* 1.44*  TROPONINI  --   --   --  0.07* 0.06* 0.05*  --   --   --   < > = values in this interval not displayed.  Estimated Creatinine Clearance: 53.2 mL/min (A) (by C-G formula based on SCr of 1.44 mg/dL (H)).   Medical History: Past Medical History:  Diagnosis Date  . Adenocarcinoma of prostate ([place])    s/p seed implants  . Arthritis   . Cellulitis of left leg    a. 07/1828 complicated by septic shock  . Chronic combined systolic and diastolic CHF (congestive heart failure) ([place])   . Colon polyps   . CORONARY ATHEROSCLEROSIS NATIVE CORONARY ARTERY    a. 01/2011  Cath/PCI: LM nl, LAD 40-50p, D1 80-small, LCX 95-small, RI 90, RCA 100, EF 20%;  b. 01/2011 Card MRI - No transmural scar;  c. 01/2011 PCI RCA->5 Promus DES, RI->3.0x16 Promus DES; d. Cath 01/13/13 patent LAD & Ramus, diffuse LCx dz, RCA mult overlapping stents w/ 95% osital stenosis, EF 20%, s/p DES to ostial/prox RCA 01/24/13   . Diabetes mellitus, type 2 ([place])   . GERD (gastroesophageal reflux disease)   . Hematoma of leg    a. left leg hematoma 03/2012 in the setting of asa/effient  . Herpes zoster ophthalmicus   . HYPERLIPIDEMIA    intolerant to Lipitor (myalgias)  . HYPERTENSION   . Ischemic cardiomyopathy    a. 01/2012 Echo EF 45%, mild LVH; b. HB71%, grade 1 diastolic dysfunction, diffuse hypokinesis, inferoposterior akinesis   . Noncompliance   .  Obesity   . OSA (obstructive sleep apnea)   . Polymyalgia rheumatica (H[facility])      Assessment: 75yom admitted with ICD shock s/p VT ablation earlier this month found to have in stent restenosis of LAD stent > s/p balloon angioplasty and plan for ticagelor 90mg  x1 month.  Hx Afib in SR on amiodarone on rivaroxaban for anticoagulation.  Plan to decrease to 15mg  x1 month while in combo with antiplatelet as per PIONEER trial and then back to 20mg  daily - appropriate dose for renal function.    Goal of Therapy:   Monitor platelets by anticoagulation protocol: Yes   Plan:  ticagrelor 90mg  BID x1 month and rivaroxaban 15mg  daily x1 month  Then increase rivaroxaban back to 20mg  daily   Bonnita Nasuti Pharm.D. CPP, BCPS Clinical Pharmacist [phone] 07/08/2017 12:23 PM

## 2017-07-08 NOTE — Discharge Summary (Signed)
Advanced Heart Failure Discharge Note  Discharge Summary   Patient ID: Wayne [name] MRN: [id number], DOB/AGE: 02/10/1941 76 y.o. Admit date: 07/05/2017 D/C date:     07/08/2017   Primary Discharge Diagnoses:  1. Chronic systolic CHF 2. CAD  3. HLD 4. PAD 5. DM2 6. Afib, Paroxysmal  7. Vfib/VT 8. OSA  Hospital Course:   Wayne [name] is a 76 y.o. male with PMH CAD s/p multiple PCI, ICM with EF 20-25%, chronic combined CHF, Vfib arrest 02/2017 and 05/2017 s/p ICD, recent admission for VT storm s/p VT ablation 06/2017, PAF on Xarelto, HTN, HLD, and DM2  Presented to ED 07/05/17 after ICD shock. Of note patient was scheduled for outpatient cath 07/06/17. Was found to have appropriate therapy with VT leading to shock.  Pt improved, electrolytes supplemented and felt to be stable to continue with cath.   Pt underwent LHC 07/06/17 which showed re-stenosis of LAD stent.  Pt returned to cath lab with intervential and underwent successful cutting balloon angioplasty of severe in-stent restonosis of proximal LAD lesion that was 90% -> 0% residual stenosis. Pt will go on Brilinta x 20 days and decreased Xarelto to 15 mg daily for same period. Will then stop Brilinta and resume full dose Xarelto.   Hospital course complicated by additional VT am of 07/07/17. EP saw and recommended up-titration of betablocker. ? Primary VT vs cardiac irritability s/p PCI. Followed close and electrolytes supplemented.   Remained slightly lightheaded on 07/08/17 so lasix planned to be held for 2 days going home. Pt felt better, and thought to be stable for discharge.   Amiodarone adjusted back up with recurrent VT. Will take 1 week of 200 mg BID then go down to 200 mg daily as planned.   He will be discharged to home in stable condition with close follow up as below.    Discharge Weight Range: 228 lbs Discharge Vitals: Blood pressure 100/75, pulse 66, temperature 98.5 F (36.9 C), temperature source Oral, resp. rate  18, height 5\' 8"  (1.727 m), weight 228 lb (103.4 kg), SpO2 95 %.  Labs: Lab Results  Component Value Date   WBC 5.8 07/07/2017   HGB 12.3 (L) 07/07/2017   HCT 38.2 (L) 07/07/2017   MCV 88.2 07/07/2017   PLT 158 07/07/2017     Recent Labs Lab 07/08/17 0444  NA 140  K 4.5  CL 106  CO2 27  BUN 17  CREATININE 1.44*  CALCIUM 9.1  GLUCOSE 142*   Lab Results  Component Value Date   CHOL 204 (H) 04/27/2017   HDL 32 (L) 04/27/2017   LDLCALC UNABLE TO CALCULATE IF TRIGLYCERIDE OVER 400 mg/dL 04/27/2017   TRIG 534 (H) 04/27/2017   BNP (last 3 results)  Recent Labs  11/17/16 0210 01/16/17 1318 06/10/17 0955  BNP 2,032.0* 363.7* 298.1*    ProBNP (last 3 results) No results for input(s): PROBNP in the last 8760 hours.   Diagnostic Studies/Procedures   Cath 07/06/2017 1. Chronic occlusion of RCA with left to right collaterals.  2. Subtotal occlusion of small AV LCx (no change).  3. 90% instent restenosis in the proximal LAD within a stent placed in 12/17.  This is a focal lesion.    Patient will undergo PCI to the proximal LAD.   Of note, will decrease his Xarelto to PIONEER dose of 15 mg daily after procedure and he will be on ticagrelor (see previous discussions of his antiplatelet allergies).   PCI 07/06/2017  Prox LAD  lesion, 90 %stenosed.  Post intervention, there is a 0% residual stenosis.   Successful cutting balloon angioplasty of the severe in-stent restenosis in the proximal LAD using a 3.5 mm Wolverine cutting balloon.   Recommend: Brilinta x one month as tolerated. Pt on long-term anticoagulation with Xarelto. Plavix allergic.   Discharge Medications   Allergies as of 07/08/2017      Reactions   Crestor [rosuvastatin Calcium] Other (See Comments)   Myalgia, Interfering with Gait   Entresto [sacubitril-valsartan] Swelling, Other (See Comments)   Angioedema   Plavix [clopidogrel] Itching, Swelling, Other (See Comments)   Nose bleeds and welts on legs  & back   Lipitor [atorvastatin] Other (See Comments)   Makes legs sore   Ancef [cefazolin] Itching, Rash   Describes itching and rash, but said that "it wasn't that bad"      Medication List    STOP taking these medications   accu-chek soft touch lancets     TAKE these medications   amiodarone 200 MG tablet Commonly known as:  PACERONE Take 200 mg (1 tablet) twice daily for 1 week then drop to 200 mg (1 tablet) once daily. What changed:  additional instructions   carvedilol 12.5 MG tablet Commonly known as:  COREG Take 1 tablet (12.5 mg total) by mouth 2 (two) times daily. What changed:  how much to take   CENTRUM SILVER ADULT 50+ Tabs Take 1 tablet by mouth daily.   digoxin 0.125 MG tablet Commonly known as:  LANOXIN Take 0.5 tablets (0.0625 mg total) by mouth daily.   diphenhydramine-acetaminophen 25-500 MG Tabs tablet Commonly known as:  TYLENOL PM Take 1 tablet by mouth at bedtime.   Fish Oil 1000 MG Caps Take 1,000 mg by mouth 2 (two) times daily.   furosemide 20 MG tablet Commonly known as:  LASIX Take 2 tablets (40 mg total) by mouth daily. Start taking on:  07/11/2017 What changed:  These instructions start on 07/11/2017. If you are unsure what to do until then, ask your doctor or other care provider.   glimepiride 2 MG tablet Commonly known as:  AMARYL TAKE 1 TABLET(2 MG) BY MOUTH DAILY BEFORE BREAKFAST   losartan 25 MG tablet Commonly known as:  COZAAR Take 0.5 tablets (12.5 mg total) by mouth daily.   pravastatin 20 MG tablet Commonly known as:  PRAVACHOL Take 1 tablet (20 mg total) by mouth every evening.   REPATHA SURECLICK 277 MG/ML [place] Generic drug:  Evolocumab Inject 1 pen into the skin every 14 (fourteen) days.   Rivaroxaban 15 MG Tabs tablet Commonly known as:  XARELTO Take 1 tablet (15 mg total) by mouth daily. What changed:  medication strength  how much to take  when to take this   spironolactone 25 MG tablet Commonly known  as:  ALDACTONE Take 0.5 tablets (12.5 mg total) by mouth daily.   ticagrelor 90 MG Tabs tablet Commonly known as:  BRILINTA Take 1 tablet (90 mg total) by mouth 2 (two) times daily.   traMADol 50 MG tablet Commonly known as:  ULTRAM Take 1 tablet (50 mg total) by mouth every 8 (eight) hours as needed for moderate pain.       Disposition   The patient will be discharged in stable condition to home. Discharge Instructions    (HEART FAILURE PATIENTS) Call MD:  Anytime you have any of the following symptoms: 1) 3 pound weight gain in 24 hours or 5 pounds in 1 week 2) shortness of breath,  with or without a dry hacking cough 3) swelling in the hands, feet or stomach 4) if you have to sleep on extra pillows at night in order to breathe.    Complete by:  As directed    Amb Referral to Cardiac Rehabilitation    Complete by:  As directed    Diagnosis:  PTCA   Diet - low sodium heart healthy    Complete by:  As directed    Heart Failure patients record your daily weight using the same scale at the same time of day    Complete by:  As directed    Increase activity slowly    Complete by:  As directed      Follow-up Information    [facility] Follow up on 07/22/2017.   Specialty:  Cardiology Why:  at 0830 for post hospital follow up. Please bring all of your medications to your visit. The code for parking 7001. Contact information: [address] [phone] [place]       [name], MD. Schedule an appointment as soon as possible for a visit.   Specialty:  Family Medicine Contact information: [place] [address] [phone]        Evans Lance, MD Follow up on 07/16/2017.   Specialty:  Cardiology Why:  at 12:30PM Contact information: [address] [address] [phone]             Duration of Discharge Encounter:  Greater than 35 minutes   Signed, Annamaria Helling 07/08/2017, 4:38 PM

## 2017-07-10 ENCOUNTER — Telehealth (HOSPITAL_COMMUNITY): Payer: Self-pay

## 2017-07-10 NOTE — Telephone Encounter (Signed)
Patient insurance is active and benefits verified. Patient insurance is Medicare A/B and [employer].  Medicare A/B - no co-payment, deductible $183.00/$183.00 has been met, 20% co-insurance, no out of pocket and no pre-authorization. Passport/reference [phone]. [employer] Insurance supplement - no co-pay, no deductible, no out of pocket, no co-insurance and no pre-authorization. Follows medicare guidelines. Passport/reference [phone].  Patient will be contacted and scheduled after their follow up appointment with the cardiologist on 07/22/17, upon review by [facility] RN navigator.

## 2017-07-13 NOTE — Telephone Encounter (Signed)
Mr. Younge called again stating that he is still having daily dizzy spells a couple of hours after taking his medications. His BP this am before medications was 150/78 mmHg, HR 71 bpm and BG 220. His weight is stable ~228 lb since restarting furosemide on 7/28. Will forward to Dr. Aundra Dubin for further recommendations.   Ruta Hinds. Velva Harman, PharmD, BCPS, CPP Clinical Pharmacist Pager: [phone] Phone: [phone] 07/13/2017 11:20 AM

## 2017-07-13 NOTE — Telephone Encounter (Signed)
He started taking losartan 12.5 mg daily starting 7/19. Do you want him to take this along with spironolactone in the evening?   Ruta Hinds. Velva Harman, PharmD, BCPS, CPP Clinical Pharmacist Pager: [phone] Phone: [phone] 07/13/2017 12:08 PM

## 2017-07-13 NOTE — Telephone Encounter (Signed)
Yes, both in evening.

## 2017-07-13 NOTE — Telephone Encounter (Signed)
He can take losartan 12.5 mg once a day, take it in the evening also.

## 2017-07-13 NOTE — Telephone Encounter (Signed)
Left VM for patient with instructions to take both losartan 12.5 mg and spironolactone 12.5 mg in the evening.   Ruta Hinds. Velva Harman, PharmD, BCPS, CPP Clinical Pharmacist Pager: [phone] Phone: [phone] 07/13/2017 4:05 PM

## 2017-07-14 ENCOUNTER — Other Ambulatory Visit: Payer: Self-pay

## 2017-07-15 ENCOUNTER — Other Ambulatory Visit: Payer: Self-pay | Admitting: Family Medicine

## 2017-07-16 ENCOUNTER — Encounter: Payer: Medicare Other | Admitting: Internal Medicine

## 2017-07-22 ENCOUNTER — Encounter (HOSPITAL_COMMUNITY): Payer: Self-pay

## 2017-07-22 ENCOUNTER — Other Ambulatory Visit (HOSPITAL_COMMUNITY): Payer: Self-pay | Admitting: Pharmacist

## 2017-07-22 ENCOUNTER — Ambulatory Visit (HOSPITAL_COMMUNITY)
Admission: RE | Admit: 2017-07-22 | Discharge: 2017-07-22 | Disposition: A | Discharge status: Home / Self Care | Payer: Medicare Other | Source: Ambulatory Visit | Attending: Cardiology | Admitting: Cardiology

## 2017-07-22 VITALS — BP 142/70 | HR 64 | Wt 227.8 lb

## 2017-07-22 DIAGNOSIS — Z87891 Personal history of nicotine dependence: Secondary | ICD-10-CM | POA: Diagnosis not present

## 2017-07-22 DIAGNOSIS — E785 Hyperlipidemia, unspecified: Secondary | ICD-10-CM | POA: Diagnosis not present

## 2017-07-22 DIAGNOSIS — I462 Cardiac arrest due to underlying cardiac condition: Secondary | ICD-10-CM | POA: Insufficient documentation

## 2017-07-22 DIAGNOSIS — Z79899 Other long term (current) drug therapy: Secondary | ICD-10-CM | POA: Insufficient documentation

## 2017-07-22 DIAGNOSIS — I472 Ventricular tachycardia, unspecified: Secondary | ICD-10-CM

## 2017-07-22 DIAGNOSIS — M1990 Unspecified osteoarthritis, unspecified site: Secondary | ICD-10-CM | POA: Insufficient documentation

## 2017-07-22 DIAGNOSIS — I10 Essential (primary) hypertension: Secondary | ICD-10-CM

## 2017-07-22 DIAGNOSIS — I2510 Atherosclerotic heart disease of native coronary artery without angina pectoris: Secondary | ICD-10-CM

## 2017-07-22 DIAGNOSIS — E118 Type 2 diabetes mellitus with unspecified complications: Secondary | ICD-10-CM

## 2017-07-22 DIAGNOSIS — I4901 Ventricular fibrillation: Secondary | ICD-10-CM | POA: Diagnosis not present

## 2017-07-22 DIAGNOSIS — E669 Obesity, unspecified: Secondary | ICD-10-CM | POA: Insufficient documentation

## 2017-07-22 DIAGNOSIS — Z7901 Long term (current) use of anticoagulants: Secondary | ICD-10-CM | POA: Insufficient documentation

## 2017-07-22 DIAGNOSIS — M353 Polymyalgia rheumatica: Secondary | ICD-10-CM | POA: Diagnosis not present

## 2017-07-22 DIAGNOSIS — I480 Paroxysmal atrial fibrillation: Secondary | ICD-10-CM | POA: Insufficient documentation

## 2017-07-22 DIAGNOSIS — I110 Hypertensive heart disease with heart failure: Secondary | ICD-10-CM | POA: Diagnosis not present

## 2017-07-22 DIAGNOSIS — K219 Gastro-esophageal reflux disease without esophagitis: Secondary | ICD-10-CM | POA: Diagnosis not present

## 2017-07-22 DIAGNOSIS — E1151 Type 2 diabetes mellitus with diabetic peripheral angiopathy without gangrene: Secondary | ICD-10-CM | POA: Diagnosis not present

## 2017-07-22 DIAGNOSIS — E1165 Type 2 diabetes mellitus with hyperglycemia: Secondary | ICD-10-CM | POA: Diagnosis not present

## 2017-07-22 DIAGNOSIS — N183 Chronic kidney disease, stage 3 unspecified: Secondary | ICD-10-CM

## 2017-07-22 DIAGNOSIS — G4733 Obstructive sleep apnea (adult) (pediatric): Secondary | ICD-10-CM | POA: Insufficient documentation

## 2017-07-22 DIAGNOSIS — I255 Ischemic cardiomyopathy: Secondary | ICD-10-CM | POA: Diagnosis not present

## 2017-07-22 DIAGNOSIS — Z9889 Other specified postprocedural states: Secondary | ICD-10-CM | POA: Diagnosis not present

## 2017-07-22 DIAGNOSIS — Z6834 Body mass index (BMI) 34.0-34.9, adult: Secondary | ICD-10-CM | POA: Insufficient documentation

## 2017-07-22 DIAGNOSIS — Z7984 Long term (current) use of oral hypoglycemic drugs: Secondary | ICD-10-CM | POA: Diagnosis not present

## 2017-07-22 DIAGNOSIS — I5022 Chronic systolic (congestive) heart failure: Secondary | ICD-10-CM | POA: Insufficient documentation

## 2017-07-22 DIAGNOSIS — IMO0002 Reserved for concepts with insufficient information to code with codable children: Secondary | ICD-10-CM

## 2017-07-22 LAB — CBC
HCT: 41.6 % (ref 39.0–52.0)
Hemoglobin: 13.3 g/dL (ref 13.0–17.0)
MCH: 28.3 pg (ref 26.0–34.0)
MCHC: 32 g/dL (ref 30.0–36.0)
MCV: 88.5 fL (ref 78.0–100.0)
PLATELETS: 184 10*3/uL (ref 150–400)
RBC: 4.7 MIL/uL (ref 4.22–5.81)
RDW: 13.9 % (ref 11.5–15.5)
WBC: 5.9 10*3/uL (ref 4.0–10.5)

## 2017-07-22 LAB — BASIC METABOLIC PANEL
Anion gap: 10 (ref 5–15)
BUN: 21 mg/dL — ABNORMAL HIGH (ref 6–20)
CALCIUM: 9.6 mg/dL (ref 8.9–10.3)
CO2: 28 mmol/L (ref 22–32)
CREATININE: 1.49 mg/dL — AB (ref 0.61–1.24)
Chloride: 101 mmol/L (ref 101–111)
GFR calc non Af Amer: 45 mL/min — ABNORMAL LOW (ref >60)
GFR, EST AFRICAN AMERICAN: 52 mL/min — AB (ref >60)
Glucose, Bld: 294 mg/dL — ABNORMAL HIGH (ref 65–99)
Potassium: 5.1 mmol/L (ref 3.5–5.1)
SODIUM: 139 mmol/L (ref 135–145)

## 2017-07-22 LAB — MAGNESIUM: MAGNESIUM: 2.3 mg/dL (ref 1.7–2.4)

## 2017-07-22 MED ORDER — SPIRONOLACTONE 25 MG PO TABS: 12.5000 mg | ORAL_TABLET | Freq: Every evening | ORAL | 6 refills | Status: DC

## 2017-07-22 MED ORDER — TICAGRELOR 90 MG PO TABS: 90.0000 mg | ORAL_TABLET | Freq: Two times a day (BID) | ORAL | 0 refills | Status: DC

## 2017-07-22 MED ORDER — PRAVASTATIN SODIUM 20 MG PO TABS: 20.0000 mg | ORAL_TABLET | Freq: Every evening | ORAL | 5 refills | Status: DC

## 2017-07-22 NOTE — Progress Notes (Signed)
Patient ID: GENEVIEVE RITZEL, male   DOB: 16-Feb-1941, 76 y.o.   MRN: [id number]   Advanced Heart Failure Clinic Note   Patient ID: JAKARIUS FLAMENCO, male   DOB: 02/01/1941, 76 y.o.   MRN: [id number] PCP: Dr. Elease Hashimoto Cardiology: Dr. Earline Mayotte KC SUMMERSON is a 76 y.o. male  with history of DM, HTN, CAD, and ischemic cardiomyopathy presents for cardiology followup. Patient had a CHF exacerbation in 2/12 and was found to have LV systolic dysfunction with EF around 20%. LHC showed RCA, ramus, and CFX disease. RCA was subtotally occluded. Cardiac MRI showed that all walls, including the inferior wall, should be viable. Patient therefore underwent opening of his chronic totally occluded RCA as well as PCI to the ramus in 2/12. He received drug eluting stents and was on Effient.  He had an echo in 2/13 that showed EF improved to 45% with moderate LV dilation and mild LV hypertrophy.   In 5/13, he developed a large left lower leg hematoma.  He was still on Effient at that time.  ASA and Effient were stopped.  The hematoma did not resolve.  In 7/13, he was re-admitted with septic shock from MSSA from an abscess in his left gastrocnemius.  He also grew Pseudomonas from the left gastrocnemius as well.  He had a prolonged course in the hospital and later in a rehab facility.  Ultimately, he got back home again.  I had him get an echo in 1/14, and this showed EF 15% with diffuse hypokinesis and inferoposterior akinesis.  He had been off of most of his prior cardiac medications.  Took him back for [facility] in 1/14.  This showed subtotal occlusion of a small AV LCx and 95% ostial in-stent restenosis in the RCA.  He was treated in 2/14 with a Xience DES to the ostial RCA and begun on Plavix.  Unfortunately, he developed diffuse hives after starting Plavix and had to be switched to ticlopidine.  He has tolerated ticlopidine.   Echo done in 12/14 showed some improvement in LV function but EF was still low at 30-35%.  He did not want ICD.      Presented to ED 11/14/15 with worsening SOB after a few steps and CXR with CHF and bilateral effusions in the setting of marked medical non-compliance. States he stopped taking his medicines in 01/2015 (except for ASA 81 and a multivitamin).  He was feeling good and decided that he did not need them anymore.  Also c/o URI symptoms. Diuresed over 2 L with IV diuretics in the ER and started on lasix 20 mg daily.   Echo (12/16) showed EF 20-25% with diffuse hypokinesis (down from 35% in 12/15).  Echo 10/17 showed EF 15% with mildly decreased RV systolic function.    He had left and right heart catheterization in 12/17. This showed elevated right and left heart filling pressures and low cardiac output. The RCA was totally occluded with collaterals, there was 95% ostial to mid LAD stenosis.  Patient had PCI with DES to proximal-mid LAD.    He was noted to be in atrial fibrillation with RVR in 2/18 at cardiac rehab. He felt fatigued.  He was admitted and started on amiodarone for rate control. ASA was stopped, ticagrelor was decreased to 60 mg bid, and Xarelto 15 mg daily was started.  He converted spontaneously back to NSR.    He suffered a ventricular fibrillation arrest in 3/18.  Luckily, he was wearing a Lifevest and  was shocked.  He was admitted and started on amiodarone.  Medtronic ICD was placed.  Echo 3/18 with EF 20-25%.   Admitted 6/8 -> 05/27/17 due to VF arrest. ICD lead had been undersensing leading to prolongation of time to discharge.  He underwent RV lead extraction with new lead implant. Was temporarily placed on IV amio. HF meds adjusted as tolerated.   He was admitted again with VT storm in 7/18.  No prior chest pain or symptoms of worsening CAD.  He had VT ablation this admission.   Admitted with ICD shock 07/05/17. Went for [facility] 07/06/17 as previously planned. Results as below.  Had cutting balloon angioplasty of the severe in-stent restenosis in the proximal LAD. Pt had additional VT  07/07/17, EP saw and recommended BB titration. Po amio up-titrated. Discharge weight 228 lbs  He presents today for post hospital follow up. Has been feeling good overall. No further dizziness or syncope s/p recent admission. Tolerated BB up-titration. BPs have been 130s at home. Denies melena or BRBPR. No chest pain. He has NOT been on brilinta. He remembers the conversations stating he needed it, it was ordered and samples provided, so unclear why he has not started.   Optivol: Personally reviewed, Fluid index down. No further VT/VF. Pt activity increasing to 2 hrs. No AT/AF alert.   LHC 07/06/17 1. Chronic occlusion of RCA with left to right collaterals.  2. Subtotal occlusion of small AV LCx (no change).  3. 90% instent restenosis in the proximal LAD within a stent placed in 12/17.  This is a focal lesion.    Pt underwent successful cutting balloon angioplasty of the severe in-stent restenosis in the proximal LAD using a 3.5 mm Wolverine cutting balloon.   Started on Brilinta x 1 month with long term AC on Xarelto and Plavix allergic.   Labs (12/16): K 4.9, creatinine 1.23 Labs (1/17): K 4.7, creatinine 1.15, BNP 1433 Labs (2/17): K 4.7, creatinine 1.14, BNP 870 Labs (3/17): K 4.5, creatinine 1.10, BNP 1257 Labs (5/17): K 4.2, creatinine 1.2, BNP 818 Labs (7/17): LDL 149, HDL 40, TGs 210 Labs (9/17): K 4.9, creatinine 1.36 Labs (10/17): K 3.7, creatinine 1.2, LDL 87, HDL 32, LFTs normal Labs (12/17): K 4.5, creatinine 1.34, digoxin 0.3 Labs (2/18): K 4.4, creatinine 1.48, hgb 11.8 Labs (3/18): K 5 => 4.6, creatinine 1.54 => 1.46, digoxin 1.3 => 0.3 Labs (5/18): TSH normal Labs (7/18): K 3.7, creatinine 1.33, hgb 11.4, digoxin 0.6, LFTs normal  ECG (personally reviewed): NSR, old ASMI, IVCD QRS 138 msec  Past Medical History:  1. HYPERTENSION  2. HYPERLIPIDEMIA: Myalgias with atorvastatin and Crestor 3. ECZEMA  4. RHINITIS  5. HERPES ZOSTER OPHTHALMICUS  6. ADENOCARCINOMA,  PROSTATE: Status post prostatectomy in 2009. Has had some incontinence since then.  7. Diabetes mellitus type II  8. Arthritis  9. Obesity  10. GERD: rare  11. CAD: Presented with exertional dyspnea, never had chest pain. LHC (2/12) with subtotalled proximal RCA and left to right collaterals, 90% proximal moderate-sized ramus, 95% proximal relatively small CFX, 40-50% proximal LAD. Cardiac MRI (2/12) showed EF 21%, some mild scar in basal segments but all wall segments would be expected to be viable. DES x 5 (overlapping) to RCA, DES x 1 to RI 01/30/11 .  Bled into leg with Effient use (long, complicated course).  LHC (1/14): AV LCx small with subtotal occlusion, 95% ostial instent restenosis in RCA. PCI in 2/14 to ostial RCA with 3.5 x 15 Xience DES.  Plavix allergy (hives) so put on ticlopidine.  - LHC (12/17):  the RCA was totally occluded with collaterals, there was 95% ostial to mid LAD stenosis, patient had PCI with DES to proximal-mid LAD. 12. Ischemic CMP: Echo (2/12) with moderately dilated LV, EF about 20% with diffuse hypokinesis and inferior akinesis, pseudonormal diastolic function, mild MR, severe LAE, mildly decreased RV systolic function. RHC (2/12) with mean RA 12, PA 40/25, mean PCWP 26, CI 2.1.  Echo (5/12) with EF 40% (appeared worse to my eye) with posterior HK, basal inferior AK, inferoseptal AK, basal anteroseptal AK, mild MR.  Cardiac MRI was repeated and showed EF 32% (improved from 21%) and mild LV dilation (was severely dilated before) with diffuse hypokinesis and subendocardial scar in the basal inferior, basal posterior, and basal anterolateral segments.  Echo (2/13) with EF 45%, moderate LV dilation, mild LVH.  Echo (1/14) with EF 15%, diffuse hypokinesis, inferoposterior akinesis, mild MR, grade I diastolic dysfunction.  Echo (5/14) with EF 30-35%, mild LV dilation, akinesis of the basal inferior wall otherwise diffuse hypokinesis.  Echo (12/14) with EF 30-35%, mild LV dilation,  diffuse hypokinesis with basal inferior and posterior akinesis. Echo (12/15) with EF 35%, mildly dilated LV, wall motion abnormalities, normal RV size and systolic function. Echo (12/16) with EF 20-25%, diffuse hypokinesis, severe LV dilation, mild MR.  - Echo (10/17): EF 15%, grade II diastolic dysfunction, mild MR, mildly decreased RV systolic function.  - Possible angioedema related to Entresto.  - Hyperkalemia with spironolactone 12.5 daily.  - CPX (10/17): peak VO2 10.6, VE/VCO2 slope 48.6, RER 1.12.  Severe HF limitation.  - RHC (12/17): mean RA 14, PA 53/27, mean PCWP 25, CI 1.93.  - Echo (3/18): EF 20-25%, moderate LV dilation, mild LVH, normal RV size and systolic function, mild aortic stenosis.  - Medtronic ICD 3/18.  13. Cervical OA.  14. Polymyalgia rheumatica 15. OSA: Severe on sleep study 10/12.  On CPAP.  16. Left lower leg hematoma in setting of Effient use.  He developed a left gastrocnemius abscess and septic shock with prolonged hospitalization beginning in 7/13.  17. PAD: Lower extremity arterial doppler evaluation in 2/17 showed occluded peroneal arteries bilaterally, ABI 1.1 (R) and 1.0 (L).   18. Carotid dopplers (10/17) with minimal disease.  19. Atrial fibrillation: Paroxysmal.  20. Ventricular fibrillation arrest: 3/18.  Medtronic ICD placed, now on amiodarone.  - VT storm 7/18, now s/p VT ablation.  21. Aortic stenosis: Mild on 3/18 echo.   Family History:  Father died with MI at age 75. He was an alcoholic. Mother died with cancer at around 40.   Social History:  Occupation: retired Administrator  Married, lives in [place]  Past smoker, quit around [employer] of systems complete and found to be negative unless listed in HPI.    Current Outpatient Prescriptions  Medication Sig Dispense Refill  . amiodarone (PACERONE) 200 MG tablet Take 200 mg by mouth daily.    . carvedilol (COREG) 12.5 MG tablet Take 1 tablet (12.5 mg total) by mouth 2 (two) times  daily. 60 tablet 6  . digoxin (LANOXIN) 0.125 MG tablet Take 0.5 tablets (0.0625 mg total) by mouth daily. 15 tablet 6  . diphenhydramine-acetaminophen (TYLENOL PM) 25-500 MG TABS tablet Take 1 tablet by mouth at bedtime.     . Evolocumab (REPATHA SURECLICK) 010 MG/ML SOAJ Inject 1 pen into the skin every 14 (fourteen) days.    . furosemide (LASIX) 20 MG tablet Take 2  tablets (40 mg total) by mouth daily. 30 tablet 6  . glimepiride (AMARYL) 2 MG tablet TAKE 1 TABLET(2 MG) BY MOUTH DAILY BEFORE BREAKFAST 90 tablet 0  . losartan (COZAAR) 25 MG tablet Take 12.5 mg by mouth every evening.    . Multiple Vitamins-Minerals (CENTRUM SILVER ADULT 50+) TABS Take 1 tablet by mouth daily.    . Omega-3 Fatty Acids (FISH OIL) 1000 MG CAPS Take 1,000 mg by mouth 2 (two) times daily.     . pravastatin (PRAVACHOL) 20 MG tablet Take 1 tablet (20 mg total) by mouth every evening. 30 tablet 2  . Rivaroxaban (XARELTO) 15 MG TABS tablet Take 1 tablet (15 mg total) by mouth daily. 30 tablet 0  . spironolactone (ALDACTONE) 25 MG tablet Take 12.5 mg by mouth every evening.    . ticagrelor (BRILINTA) 90 MG TABS tablet Take 1 tablet (90 mg total) by mouth 2 (two) times daily. 60 tablet 0  . traMADol (ULTRAM) 50 MG tablet Take 1 tablet (50 mg total) by mouth every 8 (eight) hours as needed for moderate pain. (Patient not taking: Reported on 07/22/2017) 20 tablet 0   No current facility-administered medications for this encounter.     BP (!) 142/70 (BP Location: Right Arm, Patient Position: Sitting, Cuff Size: Normal)   Pulse 64   Wt 227 lb 12.8 oz (103.3 kg)   SpO2 95%   BMI 34.64 kg/m    Wt Readings from Last 3 Encounters:  07/22/17 227 lb 12.8 oz (103.3 kg)  07/05/17 228 lb (103.4 kg)  06/29/17 228 lb (103.4 kg)   General: Well appearing. No resp difficulty. HEENT: Normal Neck: Supple. JVP 5-6. Carotids 2+ bilat; no bruits. No thyromegaly or nodule noted. Cor: PMI nondisplaced. RRR, No M/G/R noted. Small hard  area above ICD site. No redness or warmth. No drainage. Lungs: CTAB, normal effort. Abdomen: Soft, non-tender, non-distended, no HSM. No bruits or masses. +BS  Extremities: No cyanosis, clubbing, rash, R and LLE no edema.  Neuro: Alert & orientedx3, cranial nerves grossly intact. moves all 4 extremities w/o difficulty. Affect pleasant   Assessment/Plan:  1. Chronic systolic CHF: Ischemic cardiomyopathy.  3/18 echo with EF 20-25%, diffuse hypokinesis.  CPX in 10/17 with severe HF limitation.  RHC in 12/17 with elevated filling pressures and low cardiac output.  He is now s/p PCI to proximal to mid LAD with much improved symptoms.  Medtronic ICD in 3/18 after vfib arrest. He had VT again in 6/18, then VT storm in 7/18 now s/p VT ablation.   - NYHA class II symptoms - Volume status stable on 40 mg lasix daily.  - He has been off spiro for one week. Will resume at spironolactone 12.5 daily.   - Continue losartan 12.5 mg daily. Has been intolerant to uptitration.  - Continue coreg 12.5 mg BID in attempt to limit VT.  - Continue digoxin 0.0625 daily, recent level was ok.     - He may be an LVAD candidate in the future.   2. CAD:  Now s/p PCI to proximal LAD in 12/17.  He had an occluded RCA but there were left to right collaterals.  - Now s/p RCA cutting balloon angioplasty as above. Denies chest pain. Needs 1 month of Brilinta. - Continue pravastatin and Repatha.   - Now on Xarelto 20 mg daily 3. HLD: Continue Repatha and low dose pravastatin. No change.  4. PAD: Occluded peroneal arteries bilaterally.  No significant claudication. No cahnge.  5.  Diabetes type II: Unable to get empagliflozin due to cost. Per PCP.  6. Atrial fibrillation:  - NSR by exam.  - Continue Xarelto 20 mg daily. - Continue amiodarone 200 mg daily   LFTs and TSH normal recently.  He will need regular eye exams.  - Could eventually consider atrial fibrillation ablation eventually based on CASTLE-HF data if recurs. 7.  Ventricular fibrillation arrest: On amiodarone and has Medtronic ICD.  He had VT ablation in 7/18 after episode of VT storm.  - No driving until 6/23.   - Now s/p angioplasty as above. Hope that this will decrease VT burden. No further VT on ICD interrogation.  8. OSA:  - Not using CPAP  Meds as above. Stressed compliance of Brilinta. Keep 3 week appt with Dr. Aundra Dubin. Labs today.    Shirley Friar, PA-C  07/22/2017   Greater than 50% of the 25 minute visit was spent in counseling/coordination of care regarding disease state education, sliding scale diuretics, salt/fluid restriction, medication reconciliation, and discussion of plan with MD.

## 2017-07-22 NOTE — Progress Notes (Signed)
Advanced Heart Failure Medication Review by a Pharmacist  Does the patient  feel that his/her medications are working for him/her?  yes  Has the patient been experiencing any side effects to the medications prescribed?  no  Does the patient measure his/her own blood pressure or blood glucose at home?  yes   Does the patient have any problems obtaining medications due to transportation or finances?   no  Understanding of regimen: good Understanding of indications: good Potential of compliance: good Patient understands to avoid NSAIDs. Patient understands to avoid decongestants.  Issues to address at subsequent visits: None   Pharmacist comments: Mr. Collington is a pleasant 76 yo M presenting with his wife and without a medication list but with good recall of his regimen. He reports good compliance with his regimen and states that he is no longer having dizzy spells since he started taking his losartan and spironolactone in the evening. He did not have any other medication-related questions or concerns for me at this time.   Ruta Hinds. Velva Harman, PharmD, BCPS, CPP Clinical Pharmacist Pager: [phone] Phone: [phone] 07/22/2017 9:04 AM      Time with patient: 10 minutes Preparation and documentation time: 2 minutes Total time: 12 minutes

## 2017-07-22 NOTE — Patient Instructions (Signed)
Routine lab work today. Will notify you of abnormal results, otherwise no news is good news!  Take Brilinta samples 90 mg (1 tab) twice daily. Pick up Rx refill for Brilinta at pharmacy: Take 90 mg (1 tab) twice daily. Once you have completed taking these tablets, you have finished the prescription and no further refills required.  Spironolactone refilled.  Follow up as scheduled with Dr. Aundra Dubin.  Take all medication as prescribed the day of your appointment. Bring all medications with you to your appointment.  Do the following things EVERYDAY: 1) Weigh yourself in the morning before breakfast. Write it down and keep it in a log. 2) Take your medicines as prescribed 3) Eat low salt foods-Limit salt (sodium) to 2000 mg per day.  4) Stay as active as you can everyday 5) Limit all fluids for the day to less than 2 liters

## 2017-07-24 ENCOUNTER — Telehealth (HOSPITAL_COMMUNITY): Payer: Self-pay | Admitting: *Deleted

## 2017-07-24 DIAGNOSIS — I5042 Chronic combined systolic (congestive) and diastolic (congestive) heart failure: Secondary | ICD-10-CM

## 2017-07-24 NOTE — Telephone Encounter (Signed)
Basic metabolic panel  Order: [id number]  Status:  Final result Visible to patient:  No (Not Released) Dx:  Systolic CHF, chronic ([place])  Notes recorded by Darron Doom, RN on 07/24/2017 at 8:32 AM EDT Called and spoke with patient. Appointment scheduled and bmet ordered. ------  Notes recorded by Shirley Friar, PA-C on 07/22/2017 at 2:33 PM EDT Please repeat BMET 7-10 days with borderline high Danelle Berry [address] [place], [place] 07/22/2017 2:32 PM

## 2017-07-31 NOTE — Addendum Note (Signed)
Encounter addended by: Shirley Friar, PA-C on: 07/31/2017  8:56 AM<BR>    Actions taken: LOS modified

## 2017-08-04 ENCOUNTER — Ambulatory Visit (HOSPITAL_COMMUNITY)
Admission: RE | Admit: 2017-08-04 | Discharge: 2017-08-04 | Disposition: A | Discharge status: Home / Self Care | Payer: Medicare Other | Source: Ambulatory Visit | Attending: Cardiology | Admitting: Cardiology

## 2017-08-04 ENCOUNTER — Telehealth: Payer: Self-pay | Admitting: Internal Medicine

## 2017-08-04 DIAGNOSIS — I5042 Chronic combined systolic (congestive) and diastolic (congestive) heart failure: Secondary | ICD-10-CM | POA: Diagnosis not present

## 2017-08-04 LAB — BASIC METABOLIC PANEL
ANION GAP: 8 (ref 5–15)
BUN: 18 mg/dL (ref 6–20)
CO2: 26 mmol/L (ref 22–32)
Calcium: 9.1 mg/dL (ref 8.9–10.3)
Chloride: 103 mmol/L (ref 101–111)
Creatinine, Ser: 1.47 mg/dL — ABNORMAL HIGH (ref 0.61–1.24)
GFR, EST AFRICAN AMERICAN: 53 mL/min — AB (ref >60)
GFR, EST NON AFRICAN AMERICAN: 46 mL/min — AB (ref >60)
Glucose, Bld: 291 mg/dL — ABNORMAL HIGH (ref 65–99)
POTASSIUM: 5.1 mmol/L (ref 3.5–5.1)
SODIUM: 137 mmol/L (ref 135–145)

## 2017-08-04 NOTE — Telephone Encounter (Signed)
New message    Pt is calling to find out when he will be able to drive again. Please call.

## 2017-08-04 NOTE — Telephone Encounter (Signed)
Call returned to Pt.  Notified per Dr. Lovena Le that Pt may not drive for 6 months following last ICD shock.  Pt indicates understanding.

## 2017-08-04 NOTE — Telephone Encounter (Signed)
Will forward this request to Dr. Lovena Le and covering RN for further review, recommendation on driving restrictions, and follow-up with the pt thereafter.

## 2017-08-10 ENCOUNTER — Encounter (HOSPITAL_COMMUNITY): Payer: Medicare Other | Admitting: Cardiology

## 2017-08-10 NOTE — Telephone Encounter (Signed)
The pt did not pick up requested Brilinta samples.  Placed back in closet.

## 2017-08-12 DIAGNOSIS — R310 Gross hematuria: Secondary | ICD-10-CM | POA: Diagnosis not present

## 2017-08-24 ENCOUNTER — Encounter (INDEPENDENT_AMBULATORY_CARE_PROVIDER_SITE_OTHER): Payer: Self-pay | Admitting: Family

## 2017-08-24 ENCOUNTER — Ambulatory Visit (INDEPENDENT_AMBULATORY_CARE_PROVIDER_SITE_OTHER): Payer: Medicare Other | Admitting: Family

## 2017-08-24 ENCOUNTER — Ambulatory Visit (INDEPENDENT_AMBULATORY_CARE_PROVIDER_SITE_OTHER): Payer: Self-pay

## 2017-08-24 DIAGNOSIS — R310 Gross hematuria: Secondary | ICD-10-CM | POA: Diagnosis not present

## 2017-08-24 DIAGNOSIS — M21372 Foot drop, left foot: Secondary | ICD-10-CM | POA: Diagnosis not present

## 2017-08-24 DIAGNOSIS — M25562 Pain in left knee: Secondary | ICD-10-CM | POA: Diagnosis not present

## 2017-08-24 DIAGNOSIS — I255 Ischemic cardiomyopathy: Secondary | ICD-10-CM | POA: Diagnosis not present

## 2017-08-24 NOTE — Progress Notes (Signed)
Office Visit Note   Patient: Wayne [name]           Date of Birth: 1941-05-12           MRN: [id number] Visit Date: 08/24/2017              Requested by: [name], MD [place], [place] [address] PCP: [name], MD  Chief Complaint  Patient presents with  . Left Leg - Pain      HPI: The patient is a 76 year old gentleman who presents a complaining of left knee pain. Sharp pain to the medial and lateral joint lines. This is worse with ambulation and stairs. Stiffness and pain with start up. Has an ongoing for many years. Last Depo-Medrol injection in 2014 this provided interval relief.  He is status post irrigation and debridement of his left calf and knee for infectious myositis this was about 6 years ago. States that his he's had progressive numbness of his anterior lower shin deformity from about the knee down to his foot since. Also states that he feels with ambulation is been dragging the foot. Is unsteady with ambulation.    Assessment & Plan: Visit Diagnoses:  1. Left knee pain, unspecified chronicity     Plan: Offered and discussed an AFO. Patient and wife feel strongly about assessing the nerve first. Will send for EMG LLE. Depomedrol injection left knee. Follow up in office following NCV.   Follow-Up Instructions: No Follow-up on file.   Left Knee Exam   Tenderness  The patient is experiencing tenderness in the lateral joint line and medial joint line.  Range of Motion  The patient has normal left knee ROM.  Muscle Strength   The patient has normal left knee strength.  Tests  Varus: negative Valgus: negative  Other  Swelling: none Effusion: no effusion present      Patient is alert, oriented, no adenopathy, well-dressed, normal affect, normal respiratory effort. Decreased strength with dorsiflexion, left foot. Does have palpable DP.   Imaging: No results found. No images are attached to the  encounter.  Labs: Lab Results  Component Value Date   HGBA1C 9.0 (H) 06/20/2017   HGBA1C 8.9 (H) 05/22/2017   HGBA1C 8.7 (H) 07/14/2016   ESRSEDRATE 46 (H) 07/22/2012   ESRSEDRATE 5 07/17/2012   ESRSEDRATE 51 (H) 07/03/2012   CRP 12.9 (H) 07/22/2012   CRP 2.5 (H) 07/17/2012   CRP 6.0 (H) 07/03/2012   LABURIC 11.6 (H) 06/25/2012   REPTSTATUS 05/27/2017 FINAL 05/22/2017   GRAMSTAIN  07/20/2012    NO WBC SEEN NO SQUAMOUS EPITHELIAL CELLS SEEN NO ORGANISMS SEEN   GRAMSTAIN  07/20/2012    NO WBC SEEN NO SQUAMOUS EPITHELIAL CELLS SEEN NO ORGANISMS SEEN   CULT NO GROWTH 5 DAYS 05/22/2017   LABORGA PSEUDOMONAS AERUGINOSA 07/20/2012    Orders:  Orders Placed This Encounter  Procedures  . XR Knee 1-2 Views Left   No orders of the defined types were placed in this encounter.    Procedures: No procedures performed  Clinical Data: No additional findings.  ROS:  All other systems negative, except as noted in the HPI. Review of Systems  Constitutional: Negative for chills and fever.  Cardiovascular: Negative for leg swelling.  Musculoskeletal: Positive for arthralgias. Negative for joint swelling.  Neurological: Positive for weakness and numbness.    Objective: Vital Signs: There were no vitals taken for this visit.  Specialty Comments:  No specialty comments available.  [place]  History: Patient Active Problem List   Diagnosis Date Noted  . Diabetes mellitus ([place])   . ICD (implantable cardioverter-defibrillator) discharge 07/05/2017  . Syncope and collapse   . Ventricular tachycardia ([place]) 06/18/2017  . Paroxysmal atrial fibrillation ([place]) 06/10/2017  . Hypoxemia   . VT (ventricular tachycardia) ([place])   . Cardiac arrest ([place]) 05/22/2017  . Defibrillator discharge 02/11/2017  . Atrial fibrillation with rapid ventricular response ([place]) 01/16/2017  . Coronary stent occlusion 11/18/2016  . Type 2 diabetes mellitus, uncontrolled ([place]) 09/30/2016  . CKD (chronic  kidney disease) stage 3, GFR 30-59 ml/min 09/30/2016  . CAD (coronary artery disease) 11/20/2015  . Leucocytoclastic vasculitis ([place]) 06/25/2012  . Staphylococcus aureus bacteremia with sepsis ([place]) 06/18/2012  . NSTEMI, initial episode of care ([place]) 06/16/2012  . Cellulitis and abscess of lower leg 06/14/2012    Class: Acute  . Healthcare-associated pneumonia 06/14/2012    Class: Acute  . Hyperglycemia 06/14/2012    Class: Acute  . Leg swelling 04/22/2012  . Obesity 01/08/2012  . OSA (obstructive sleep apnea) 09/02/2011  . Systolic CHF, chronic ([place]) 06/10/2011  . Polymyalgia rheumatica ([place]) 06/02/2011  . HEMATOCHEZIA 02/10/2011  . BURSITIS, LEFT HIP 02/10/2011  . CORONARY ATHEROSCLEROSIS NATIVE CORONARY ARTERY 01/27/2011  . CARDIOMYOPATHY 01/21/2011  . CHEST PAIN UNSPECIFIED 01/20/2011  . DYSPNEA 01/16/2011  . ABDOMINAL PAIN, UNSPECIFIED SITE 01/13/2011  . ECZEMA 01/28/2010  . BRONCHITIS, ACUTE WITH MILD BRONCHOSPASM 12/21/2009  . HERPES ZOSTER OPHTHALMICUS 07/19/2009  . ADENOCARCINOMA, PROSTATE 06/20/2009  . Secondary DM with peripheral vascular disease, uncontrolled ([place]) 06/20/2009  . Hyperlipidemia 06/20/2009  . Essential hypertension 06/20/2009  . HYPERTROPHY PROSTATE W/UR OBST & OTH LUTS 06/20/2009   Past Medical History:  Diagnosis Date  . Adenocarcinoma of prostate ([place])    s/p seed implants  . Arthritis   . Cellulitis of left leg    a. 02/5464 complicated by septic shock  . Chronic combined systolic and diastolic CHF (congestive heart failure) ([place])   . Colon polyps   . CORONARY ATHEROSCLEROSIS NATIVE CORONARY ARTERY    a. 01/2011 Cath/PCI: LM nl, LAD 40-50p, D1 80-small, LCX 95-small, RI 90, RCA 100, EF 20%;  b. 01/2011 Card MRI - No transmural scar;  c. 01/2011 PCI RCA->5 Promus DES, RI->3.0x16 Promus DES; d. Cath 01/13/13 patent LAD & Ramus, diffuse LCx dz, RCA mult overlapping stents w/ 95% osital stenosis, EF 20%, s/p DES to ostial/prox RCA 01/24/13   . Diabetes  mellitus, type 2 ([place])   . GERD (gastroesophageal reflux disease)   . Hematoma of leg    a. left leg hematoma 03/2012 in the setting of asa/effient  . Herpes zoster ophthalmicus   . HYPERLIPIDEMIA    intolerant to Lipitor (myalgias)  . HYPERTENSION   . Ischemic cardiomyopathy    a. 01/2012 Echo EF 45%, mild LVH; b. KC12%, grade 1 diastolic dysfunction, diffuse hypokinesis, inferoposterior akinesis   . Noncompliance   . Obesity   . OSA (obstructive sleep apnea)   . Polymyalgia rheumatica (HCC)     Family History  Problem Relation Age of Onset  . Cancer Mother 87       unknown CA  . Heart disease Father   . Alcohol abuse Father   . Heart attack Father 109    Past Surgical History:  Procedure Laterality Date  . CARDIAC CATHETERIZATION  01/24/2013  . CARDIAC CATHETERIZATION N/A 11/14/2016   Procedure: Right/Left Heart Cath and Coronary Angiography;  Surgeon: Larey Dresser, MD;  Location: [facility]  CV LAB;  Service: Cardiovascular;  Laterality: N/A;  . CARDIAC CATHETERIZATION N/A 11/17/2016   Procedure: Coronary Stent Intervention w/Impella;  Surgeon: Peter M Martinique, MD;  Location: [facility];  Service: Cardiovascular;  Laterality: N/A;  . CARDIAC CATHETERIZATION N/A 11/17/2016   Procedure: Coronary Atherectomy;  Surgeon: Peter M Martinique, MD;  Location: [facility];  Service: Cardiovascular;  Laterality: N/A;  . CORONARY ANGIOPLASTY WITH STENT PLACEMENT  01/24/2013   DES to RCA  . CORONARY BALLOON ANGIOPLASTY N/A 07/06/2017   Procedure: Coronary Balloon Angioplasty;  Surgeon: Sherren Mocha, MD;  Location: [facility];  Service: Cardiovascular;  Laterality: N/A;  . CORONARY STENT PLACEMENT  2012   reports 6 stents placed  . I&D EXTREMITY  06/15/2012   Procedure: IRRIGATION AND DEBRIDEMENT EXTREMITY;  Surgeon: Newt Minion, MD;  Location: [facility];  Service: Orthopedics;  Laterality: Left;  I&D Left Posterior Knee  . I&D EXTREMITY  06/30/2012   Procedure: IRRIGATION  AND DEBRIDEMENT EXTREMITY;  Surgeon: Newt Minion, MD;  Location: [facility];  Service: Orthopedics;  Laterality: Left;  Left Leg Irrigation and Debridement and placement of Wound VAC and application of  A-cell  . I&D EXTREMITY  07/20/2012   Procedure: IRRIGATION AND DEBRIDEMENT EXTREMITY;  Surgeon: Newt Minion, MD;  Location: [facility];  Service: Orthopedics;  Laterality: Left;  Irrigation and Debridement Left Leg and Place antibiotic beads   . ICD IMPLANT N/A 02/12/2017   Procedure: ICD Implant;  Surgeon: Evans Lance, MD;  Location: [facility];  Service: Cardiovascular;  Laterality: N/A;  . LEAD REVISION/REPAIR N/A 05/26/2017   Procedure: Lead Revision/Repair;  Surgeon: Evans Lance, MD;  Location: [facility];  Service: Cardiovascular;  Laterality: N/A;  . LEFT HEART CATH AND CORONARY ANGIOGRAPHY N/A 07/06/2017   Procedure: Left Heart Cath and Coronary Angiography;  Surgeon: Larey Dresser, MD;  Location: [facility];  Service: Cardiovascular;  Laterality: N/A;  . PERCUTANEOUS CORONARY STENT INTERVENTION (PCI-S) N/A 01/24/2013   Procedure: PERCUTANEOUS CORONARY STENT INTERVENTION (PCI-S);  Surgeon: Wellington Hampshire, MD;  Location: [facility] C[facility];  Service: Cardiovascular;  Laterality: N/A;  . PROSTATE SURGERY     cancer, seed implant  . ULTRASOUND GUIDANCE FOR VASCULAR ACCESS  11/14/2016   Procedure: Ultrasound Guidance For Vascular Access;  Surgeon: Larey Dresser, MD;  Location: [facility];  Service: Cardiovascular;;  . Stephanie Coup ABLATION N/A 06/19/2017   Procedure: Stephanie Coup Ablation;  Surgeon: Evans Lance, MD;  Location: [facility];  Service: Cardiovascular;  Laterality: N/A;   Social History   Occupational History  . 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 Smoking status: Former Smoker    Packs/day: 0.30    Years: 20.00    Types: Cigarettes    Quit date: 02/23/1989  . Smokeless tobacco: Never Used  . Alcohol use No  . Drug use:  No  . Sexual activity: Not on file

## 2017-08-27 ENCOUNTER — Telehealth (HOSPITAL_COMMUNITY): Payer: Self-pay

## 2017-08-27 NOTE — Telephone Encounter (Signed)
FMLA completed for patient's son, patient made aware and asked to mail forms to his address. Forms mailed to confirmed apt and copy scanned into patient's electronic medical record.  Renee Pain, RN

## 2017-09-05 ENCOUNTER — Other Ambulatory Visit (HOSPITAL_COMMUNITY): Payer: Self-pay | Admitting: Student

## 2017-09-07 ENCOUNTER — Encounter: Payer: Self-pay | Admitting: Internal Medicine

## 2017-09-07 ENCOUNTER — Ambulatory Visit (INDEPENDENT_AMBULATORY_CARE_PROVIDER_SITE_OTHER): Payer: Medicare Other | Admitting: Internal Medicine

## 2017-09-07 VITALS — BP 122/58 | HR 114 | Resp 16 | Ht 68.0 in | Wt 225.8 lb

## 2017-09-07 DIAGNOSIS — I5022 Chronic systolic (congestive) heart failure: Secondary | ICD-10-CM

## 2017-09-07 DIAGNOSIS — I472 Ventricular tachycardia, unspecified: Secondary | ICD-10-CM

## 2017-09-07 DIAGNOSIS — I480 Paroxysmal atrial fibrillation: Secondary | ICD-10-CM

## 2017-09-07 DIAGNOSIS — I5043 Acute on chronic combined systolic (congestive) and diastolic (congestive) heart failure: Secondary | ICD-10-CM

## 2017-09-07 DIAGNOSIS — I255 Ischemic cardiomyopathy: Secondary | ICD-10-CM

## 2017-09-07 LAB — CUP PACEART INCLINIC DEVICE CHECK
Date Time Interrogation Session: 20180924105944
HIGH POWER IMPEDANCE MEASURED VALUE: 77 Ohm
Implantable Lead Implant Date: 20180612
Lead Channel Impedance Value: 399 Ohm
Lead Channel Pacing Threshold Amplitude: 0.75 V
Lead Channel Sensing Intrinsic Amplitude: 13.375 mV
Lead Channel Sensing Intrinsic Amplitude: 15 mV
Lead Channel Setting Sensing Sensitivity: 0.3 mV
MDC IDC LEAD LOCATION: 753860
MDC IDC MSMT BATTERY REMAINING LONGEVITY: 124 mo
MDC IDC MSMT BATTERY VOLTAGE: 3.01 V
MDC IDC MSMT LEADCHNL RV IMPEDANCE VALUE: 456 Ohm
MDC IDC MSMT LEADCHNL RV PACING THRESHOLD PULSEWIDTH: 0.4 ms
MDC IDC PG IMPLANT DT: 20180103
MDC IDC SET LEADCHNL RV PACING AMPLITUDE: 2.5 V
MDC IDC SET LEADCHNL RV PACING PULSEWIDTH: 0.4 ms
MDC IDC STAT BRADY RV PERCENT PACED: 0.01 %

## 2017-09-07 NOTE — Patient Instructions (Addendum)
Medication Instructions:  Your physician recommends that you continue on your current medications as directed. Please refer to the Current Medication list given to you today.  Labwork: None ordered.  Testing/Procedures: None ordered.  Follow-Up: Your physician wants you to follow-up in: 6 months with Dr. Lovena Le.   You will receive a reminder letter in the mail two months in advance. If you don't receive a letter, please call our office to schedule the follow-up appointment.  Remote monitoring is used to monitor your ICD from home. This monitoring reduces the number of office visits required to check your device to one time per year. It allows [place] to keep an eye on the functioning of your device to ensure it is working properly. You are scheduled for a device check from home on 12/09/2017. You may send your transmission at any time that day. If you have a wireless device, the transmission will be sent automatically. After your physician reviews your transmission, you will receive a postcard with your next transmission date.    Any Other Special Instructions Will Be Listed Below (If Applicable).     If you need a refill on your cardiac medications before your next appointment, please call your pharmacy.

## 2017-09-07 NOTE — Progress Notes (Signed)
HPI [name] returns today for ongoing evaluation and management of ventricular tachycardia in the setting of an ischemic cardiomyopathy and chronic systolic heart failure. He is a very pleasant 76 year old man with ventricular tachycardia and initial under sensing of his device to underwent device revision several months ago. A combination of VT ablation and amiodarone appears to have quieted down his VT. He has had no recent ICD shocks. Allergies  Allergen Reactions  . Crestor [Rosuvastatin Calcium] Other (See Comments)    Myalgia, Interfering with Gait  . Entresto [Sacubitril-Valsartan] Swelling and Other (See Comments)    Angioedema  . Plavix [Clopidogrel] Itching, Swelling and Other (See Comments)    Nose bleeds and welts on legs & back  . Lipitor [Atorvastatin] Other (See Comments)    Makes legs sore  . Ancef [Cefazolin] Itching and Rash    Describes itching and rash, but said that "it wasn't that bad"     Current Outpatient Prescriptions  Medication Sig Dispense Refill  . amiodarone (PACERONE) 200 MG tablet Take 200 mg by mouth daily.    . carvedilol (COREG) 12.5 MG tablet Take 1 tablet (12.5 mg total) by mouth 2 (two) times daily. 60 tablet 6  . digoxin (LANOXIN) 0.125 MG tablet Take 0.5 tablets (0.0625 mg total) by mouth daily. 15 tablet 6  . diphenhydramine-acetaminophen (TYLENOL PM) 25-500 MG TABS tablet Take 1 tablet by mouth at bedtime.     . Evolocumab (REPATHA SURECLICK) 814 MG/ML SOAJ Inject 1 pen into the skin every 14 (fourteen) days.    . furosemide (LASIX) 20 MG tablet Take 2 tablets (40 mg total) by mouth daily. 30 tablet 6  . glimepiride (AMARYL) 2 MG tablet TAKE 1 TABLET(2 MG) BY MOUTH DAILY BEFORE BREAKFAST 90 tablet 0  . losartan (COZAAR) 25 MG tablet Take 12.5 mg by mouth every evening.    . Multiple Vitamins-Minerals (CENTRUM SILVER ADULT 50+) TABS Take 1 tablet by mouth daily.    . Omega-3 Fatty Acids (FISH OIL) 1000 MG CAPS Take 1,000 mg by mouth 2  (two) times daily.     . pravastatin (PRAVACHOL) 20 MG tablet Take 1 tablet (20 mg total) by mouth every evening. 30 tablet 5  . Rivaroxaban (XARELTO) 15 MG TABS tablet Take 1 tablet (15 mg total) by mouth daily. 30 tablet 0  . spironolactone (ALDACTONE) 25 MG tablet Take 0.5 tablets (12.5 mg total) by mouth every evening. 15 tablet 6  . ticagrelor (BRILINTA) 90 MG TABS tablet Take 1 tablet (90 mg total) by mouth 2 (two) times daily. STOP taking after bottle is empty. 28 tablet 0  . traMADol (ULTRAM) 50 MG tablet Take 1 tablet (50 mg total) by mouth every 8 (eight) hours as needed for moderate pain. 20 tablet 0   No current facility-administered medications for this visit.      Past Medical History:  Diagnosis Date  . Adenocarcinoma of prostate ([place])    s/p seed implants  . Arthritis   . Cellulitis of left leg    a. 03/8184 complicated by septic shock  . Chronic combined systolic and diastolic CHF (congestive heart failure) ([place])   . Colon polyps   . CORONARY ATHEROSCLEROSIS NATIVE CORONARY ARTERY    a. 01/2011 Cath/PCI: LM nl, LAD 40-50p, D1 80-small, LCX 95-small, RI 90, RCA 100, EF 20%;  b. 01/2011 Card MRI - No transmural scar;  c. 01/2011 PCI RCA->5 Promus DES, RI->3.0x16 Promus DES; d. Cath 01/13/13 patent LAD & Ramus,  diffuse LCx dz, RCA mult overlapping stents w/ 95% osital stenosis, EF 20%, s/p DES to ostial/prox RCA 01/24/13   . Diabetes mellitus, type 2 ([place])   . GERD (gastroesophageal reflux disease)   . Hematoma of leg    a. left leg hematoma 03/2012 in the setting of asa/effient  . Herpes zoster ophthalmicus   . HYPERLIPIDEMIA    intolerant to Lipitor (myalgias)  . HYPERTENSION   . Ischemic cardiomyopathy    a. 01/2012 Echo EF 45%, mild LVH; b. LD35%, grade 1 diastolic dysfunction, diffuse hypokinesis, inferoposterior akinesis   . Noncompliance   . Obesity   . OSA (obstructive sleep apnea)   . Polymyalgia rheumatica (HCC)     ROS:   All systems reviewed and negative  except as noted in the HPI.   Past Surgical History:  Procedure Laterality Date  . CARDIAC CATHETERIZATION  01/24/2013  . CARDIAC CATHETERIZATION N/A 11/14/2016   Procedure: Right/Left Heart Cath and Coronary Angiography;  Surgeon: Larey Dresser, MD;  Location: [facility];  Service: Cardiovascular;  Laterality: N/A;  . CARDIAC CATHETERIZATION N/A 11/17/2016   Procedure: Coronary Stent Intervention w/Impella;  Surgeon: Peter M Martinique, MD;  Location: [facility];  Service: Cardiovascular;  Laterality: N/A;  . CARDIAC CATHETERIZATION N/A 11/17/2016   Procedure: Coronary Atherectomy;  Surgeon: Peter M Martinique, MD;  Location: [facility];  Service: Cardiovascular;  Laterality: N/A;  . CORONARY ANGIOPLASTY WITH STENT PLACEMENT  01/24/2013   DES to RCA  . CORONARY BALLOON ANGIOPLASTY N/A 07/06/2017   Procedure: Coronary Balloon Angioplasty;  Surgeon: Sherren Mocha, MD;  Location: [facility];  Service: Cardiovascular;  Laterality: N/A;  . CORONARY STENT PLACEMENT  2012   reports 6 stents placed  . I&D EXTREMITY  06/15/2012   Procedure: IRRIGATION AND DEBRIDEMENT EXTREMITY;  Surgeon: Newt Minion, MD;  Location: [facility];  Service: Orthopedics;  Laterality: Left;  I&D Left Posterior Knee  . I&D EXTREMITY  06/30/2012   Procedure: IRRIGATION AND DEBRIDEMENT EXTREMITY;  Surgeon: Newt Minion, MD;  Location: [facility];  Service: Orthopedics;  Laterality: Left;  Left Leg Irrigation and Debridement and placement of Wound VAC and application of  A-cell  . I&D EXTREMITY  07/20/2012   Procedure: IRRIGATION AND DEBRIDEMENT EXTREMITY;  Surgeon: Newt Minion, MD;  Location: [facility];  Service: Orthopedics;  Laterality: Left;  Irrigation and Debridement Left Leg and Place antibiotic beads   . ICD IMPLANT N/A 02/12/2017   Procedure: ICD Implant;  Surgeon: Evans Lance, MD;  Location: [facility];  Service: Cardiovascular;  Laterality: N/A;  . LEAD REVISION/REPAIR N/A 05/26/2017    Procedure: Lead Revision/Repair;  Surgeon: Evans Lance, MD;  Location: [facility];  Service: Cardiovascular;  Laterality: N/A;  . LEFT HEART CATH AND CORONARY ANGIOGRAPHY N/A 07/06/2017   Procedure: Left Heart Cath and Coronary Angiography;  Surgeon: Larey Dresser, MD;  Location: [facility];  Service: Cardiovascular;  Laterality: N/A;  . PERCUTANEOUS CORONARY STENT INTERVENTION (PCI-S) N/A 01/24/2013   Procedure: PERCUTANEOUS CORONARY STENT INTERVENTION (PCI-S);  Surgeon: Wellington Hampshire, MD;  Location: [facility] C[facility];  Service: Cardiovascular;  Laterality: N/A;  . PROSTATE SURGERY     cancer, seed implant  . ULTRASOUND GUIDANCE FOR VASCULAR ACCESS  11/14/2016   Procedure: Ultrasound Guidance For Vascular Access;  Surgeon: Larey Dresser, MD;  Location: [facility] LAB;  Service: Cardiovascular;;  . Stephanie Coup ABLATION N/A  06/19/2017   Procedure: Stephanie Coup Ablation;  Surgeon: Evans Lance, MD;  Location: [facility];  Service: Cardiovascular;  Laterality: N/A;     Family History  Problem Relation Age of Onset  . Cancer Mother 23       unknown CA  . Heart disease Father   . Alcohol abuse Father   . Heart attack Father 52     Social History   Social History  . Marital status: Married    Spouse name: N/A  . Number of children: 1  . Years of education: N/A   Occupational History  . 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 Smoking status: Former Smoker    Packs/day: 0.30    Years: 20.00    Types: Cigarettes    Quit date: 02/23/1989  . Smokeless tobacco: Never Used  . Alcohol use No  . Drug use: No  . Sexual activity: Not on file   Other Topics Concern  . Not on file   Social History Narrative   Retired Administrator     BP (!) 474/25   Pulse (!) 114   Resp 16   Ht 5\' 8"  (1.727 m)   Wt 225 lb 12.8 oz (102.4 kg)   SpO2 93%   BMI 34.33 kg/m   Physical Exam:  Well appearing 76 year old man, NAD HEENT:  Unremarkable Neck:  7 cm JVD, no thyromegally Lymphatics:  No adenopathy Back:  No CVA tenderness Lungs:  Clear, with no wheezes, rales, or rhonchi. Well-healed ICD incision. HEART:  Regular rate rhythm, no murmurs, no rubs, no clicks Abd:  soft, positive bowel sounds, no organomegally, no rebound, no guarding Ext:  2 plus pulses, no edema, no cyanosis, no clubbing Skin:  No rashes no nodules Neuro:  CN II through XII intact, motor grossly intact  EKG - normal sinus rhythm with intraventricular conduction delay, prior inferior and anterior myocardial infarction  DEVICE  Normal device function.  See PaceArt for details.   Assess/Plan: 1. Ventricular tachycardia - his tachycardia remains quiet on amiodarone after catheter ablation. He will continue 200 mg daily of amiodarone. 2. Chronic systolic heart failure - his symptoms are class II. He is limited most by arthritis in his foot and leg. 3. ICD his Medtronic device is working normally. He will be to recheck in several months. 4. Ischemic cardiomyopathy - he denies anginal symptoms. He will continue his current medications. I've encouraged the patient to increase his physical activity.  [name], M.D.

## 2017-09-09 ENCOUNTER — Ambulatory Visit (INDEPENDENT_AMBULATORY_CARE_PROVIDER_SITE_OTHER): Payer: Medicare Other | Admitting: Physical Medicine and Rehabilitation

## 2017-09-09 ENCOUNTER — Encounter (INDEPENDENT_AMBULATORY_CARE_PROVIDER_SITE_OTHER): Payer: Self-pay | Admitting: Physical Medicine and Rehabilitation

## 2017-09-09 DIAGNOSIS — R202 Paresthesia of skin: Secondary | ICD-10-CM | POA: Diagnosis not present

## 2017-09-09 DIAGNOSIS — M21372 Foot drop, left foot: Secondary | ICD-10-CM

## 2017-09-09 DIAGNOSIS — R531 Weakness: Secondary | ICD-10-CM

## 2017-09-09 NOTE — Progress Notes (Deleted)
Left leg pain and weakness for a few months. The pain is mostly in the left knee. Numbness and tingling down front of lower leg to top of foot.Also has sharp pain in foot occasionally.

## 2017-09-11 ENCOUNTER — Telehealth (HOSPITAL_COMMUNITY): Payer: Self-pay

## 2017-09-11 ENCOUNTER — Other Ambulatory Visit: Payer: Self-pay | Admitting: Family Medicine

## 2017-09-11 NOTE — Telephone Encounter (Signed)
I called patient to discuss scheduling for cardiac rehab. Patient declined. Referral closed.

## 2017-09-14 ENCOUNTER — Encounter (HOSPITAL_COMMUNITY): Payer: Self-pay | Admitting: Cardiology

## 2017-09-14 ENCOUNTER — Encounter (HOSPITAL_COMMUNITY): Payer: Self-pay

## 2017-09-14 ENCOUNTER — Ambulatory Visit (HOSPITAL_COMMUNITY)
Admission: RE | Admit: 2017-09-14 | Discharge: 2017-09-14 | Disposition: A | Discharge status: Home / Self Care | Payer: Medicare Other | Source: Ambulatory Visit | Attending: Cardiology | Admitting: Cardiology

## 2017-09-14 VITALS — BP 140/65 | HR 63 | Wt 230.0 lb

## 2017-09-14 DIAGNOSIS — I350 Nonrheumatic aortic (valve) stenosis: Secondary | ICD-10-CM | POA: Diagnosis not present

## 2017-09-14 DIAGNOSIS — E1122 Type 2 diabetes mellitus with diabetic chronic kidney disease: Secondary | ICD-10-CM | POA: Insufficient documentation

## 2017-09-14 DIAGNOSIS — I462 Cardiac arrest due to underlying cardiac condition: Secondary | ICD-10-CM | POA: Diagnosis not present

## 2017-09-14 DIAGNOSIS — M353 Polymyalgia rheumatica: Secondary | ICD-10-CM | POA: Diagnosis not present

## 2017-09-14 DIAGNOSIS — Z006 Encounter for examination for normal comparison and control in clinical research program: Secondary | ICD-10-CM

## 2017-09-14 DIAGNOSIS — I2510 Atherosclerotic heart disease of native coronary artery without angina pectoris: Secondary | ICD-10-CM | POA: Diagnosis not present

## 2017-09-14 DIAGNOSIS — N183 Chronic kidney disease, stage 3 unspecified: Secondary | ICD-10-CM

## 2017-09-14 DIAGNOSIS — Z7901 Long term (current) use of anticoagulants: Secondary | ICD-10-CM | POA: Insufficient documentation

## 2017-09-14 DIAGNOSIS — E875 Hyperkalemia: Secondary | ICD-10-CM | POA: Diagnosis not present

## 2017-09-14 DIAGNOSIS — K219 Gastro-esophageal reflux disease without esophagitis: Secondary | ICD-10-CM | POA: Insufficient documentation

## 2017-09-14 DIAGNOSIS — Z9889 Other specified postprocedural states: Secondary | ICD-10-CM | POA: Diagnosis not present

## 2017-09-14 DIAGNOSIS — I130 Hypertensive heart and chronic kidney disease with heart failure and stage 1 through stage 4 chronic kidney disease, or unspecified chronic kidney disease: Secondary | ICD-10-CM | POA: Diagnosis not present

## 2017-09-14 DIAGNOSIS — Z95810 Presence of automatic (implantable) cardiac defibrillator: Secondary | ICD-10-CM | POA: Insufficient documentation

## 2017-09-14 DIAGNOSIS — I255 Ischemic cardiomyopathy: Secondary | ICD-10-CM | POA: Insufficient documentation

## 2017-09-14 DIAGNOSIS — I480 Paroxysmal atrial fibrillation: Secondary | ICD-10-CM | POA: Diagnosis not present

## 2017-09-14 DIAGNOSIS — Z87891 Personal history of nicotine dependence: Secondary | ICD-10-CM | POA: Insufficient documentation

## 2017-09-14 DIAGNOSIS — E669 Obesity, unspecified: Secondary | ICD-10-CM | POA: Diagnosis not present

## 2017-09-14 DIAGNOSIS — M1990 Unspecified osteoarthritis, unspecified site: Secondary | ICD-10-CM | POA: Diagnosis not present

## 2017-09-14 DIAGNOSIS — I4901 Ventricular fibrillation: Secondary | ICD-10-CM | POA: Insufficient documentation

## 2017-09-14 DIAGNOSIS — I5022 Chronic systolic (congestive) heart failure: Secondary | ICD-10-CM | POA: Insufficient documentation

## 2017-09-14 DIAGNOSIS — E785 Hyperlipidemia, unspecified: Secondary | ICD-10-CM | POA: Diagnosis not present

## 2017-09-14 DIAGNOSIS — E038 Other specified hypothyroidism: Secondary | ICD-10-CM | POA: Insufficient documentation

## 2017-09-14 DIAGNOSIS — Z8546 Personal history of malignant neoplasm of prostate: Secondary | ICD-10-CM | POA: Insufficient documentation

## 2017-09-14 DIAGNOSIS — G4733 Obstructive sleep apnea (adult) (pediatric): Secondary | ICD-10-CM | POA: Diagnosis not present

## 2017-09-14 DIAGNOSIS — Z7984 Long term (current) use of oral hypoglycemic drugs: Secondary | ICD-10-CM | POA: Insufficient documentation

## 2017-09-14 DIAGNOSIS — I2582 Chronic total occlusion of coronary artery: Secondary | ICD-10-CM | POA: Diagnosis not present

## 2017-09-14 LAB — COMPREHENSIVE METABOLIC PANEL
ALBUMIN: 3.6 g/dL (ref 3.5–5.0)
ALK PHOS: 89 U/L (ref 38–126)
ALT: 33 U/L (ref 17–63)
AST: 32 U/L (ref 15–41)
Anion gap: 10 (ref 5–15)
BILIRUBIN TOTAL: 0.7 mg/dL (ref 0.3–1.2)
BUN: 19 mg/dL (ref 6–20)
CO2: 23 mmol/L (ref 22–32)
Calcium: 8.8 mg/dL — ABNORMAL LOW (ref 8.9–10.3)
Chloride: 103 mmol/L (ref 101–111)
Creatinine, Ser: 1.31 mg/dL — ABNORMAL HIGH (ref 0.61–1.24)
GFR calc Af Amer: >60 mL/min (ref >60)
GFR calc non Af Amer: 52 mL/min — ABNORMAL LOW (ref >60)
GLUCOSE: 314 mg/dL — AB (ref 65–99)
POTASSIUM: 4.4 mmol/L (ref 3.5–5.1)
Sodium: 136 mmol/L (ref 135–145)
TOTAL PROTEIN: 6.6 g/dL (ref 6.5–8.1)

## 2017-09-14 LAB — LIPID PANEL
CHOL/HDL RATIO: 3.5 ratio
Cholesterol: 139 mg/dL (ref 0–200)
HDL: 40 mg/dL — ABNORMAL LOW (ref >40)
LDL CALC: 50 mg/dL (ref 0–99)
Triglycerides: 247 mg/dL — ABNORMAL HIGH (ref <150)
VLDL: 49 mg/dL — AB (ref 0–40)

## 2017-09-14 LAB — CBC
HCT: 45.2 % (ref 39.0–52.0)
HEMOGLOBIN: 13.7 g/dL (ref 13.0–17.0)
MCH: 26.8 pg (ref 26.0–34.0)
MCHC: 30.3 g/dL (ref 30.0–36.0)
MCV: 88.3 fL (ref 78.0–100.0)
Platelets: 156 10*3/uL (ref 150–400)
RBC: 5.12 MIL/uL (ref 4.22–5.81)
RDW: 13.8 % (ref 11.5–15.5)
WBC: 5.6 10*3/uL (ref 4.0–10.5)

## 2017-09-14 LAB — TSH: TSH: 1.261 u[IU]/mL (ref 0.350–4.500)

## 2017-09-14 LAB — DIGOXIN LEVEL: DIGOXIN LVL: 0.4 ng/mL — AB (ref 0.8–2.0)

## 2017-09-14 MED ORDER — RIVAROXABAN 20 MG PO TABS: 20.0000 mg | ORAL_TABLET | Freq: Every day | ORAL | 3 refills | Status: DC

## 2017-09-14 NOTE — Patient Instructions (Signed)
Stop Brilinta  Increase Xarelto 20 mg (1tab) daily  Labs drawn today (if we do not call you, then your lab work was stable)   Your physician recommends that you return for lab work in: 10 days  Your physician recommends that you schedule a follow-up appointment in: 2 months

## 2017-09-14 NOTE — Progress Notes (Signed)
BeAT-HF Study visit 1 year visit completed in conjunction with HF clinic visit. Patient states overall he feels well today, does feel his recent 5lb weight gain. States he continues to struggle with diet compliance. All aspects of Research appt completed. Patient to follow up with Research in 3 months.

## 2017-09-14 NOTE — Progress Notes (Signed)
Patient ID: Wayne [name], male   DOB: 07/08/1941, 76 y.o.   MRN: [id number]   Advanced Heart Failure Clinic Note   Patient ID: Wayne [name], male   DOB: [birth year], 76 y.o.   MRN: [id number] PCP: Dr. Elease Hashimoto Cardiology: Dr. Earline Mayotte Wayne [name] is a 76 y.o. male  with history of DM, HTN, CAD, and ischemic cardiomyopathy who presents for followup of CHF and CAD. Patient had a CHF exacerbation in 2/12 and was found to have LV systolic dysfunction with EF around 20%. LHC showed RCA, ramus, and CFX disease. RCA was subtotally occluded. Cardiac MRI showed that all walls, including the inferior wall, should be viable. Patient therefore underwent opening of his chronic totally occluded RCA as well as PCI to the ramus in 2/12. He received drug eluting stents and was on Effient.  He had an echo in 2/13 that showed EF improved to 45% with moderate LV dilation and mild LV hypertrophy.   In 5/13, he developed a large left lower leg hematoma.  He was still on Effient at that time.  ASA and Effient were stopped.  The hematoma did not resolve.  In 7/13, he was re-admitted with septic shock from MSSA from an abscess in his left gastrocnemius.  He also grew Pseudomonas from the left gastrocnemius as well.  He had a prolonged course in the hospital and later in a rehab facility.  Ultimately, he got back home again.  I had him get an echo in 1/14, and this showed EF 15% with diffuse hypokinesis and inferoposterior akinesis.  He had been off of most of his prior cardiac medications.  Took him back for [facility] in 1/14.  This showed subtotal occlusion of a small AV LCx and 95% ostial in-stent restenosis in the RCA.  He was treated in 2/14 with a Xience DES to the ostial RCA and begun on Plavix.  Unfortunately, he developed diffuse hives after starting Plavix and had to be switched to ticlopidine.  He has tolerated ticlopidine.   Echo done in 12/14 showed some improvement in LV function but EF was still low at 30-35%.  He did not want  ICD.    Presented to ED 11/14/15 with worsening SOB after a few steps and CXR with CHF and bilateral effusions in the setting of marked medical non-compliance. States he stopped taking his medicines in 01/2015 (except for ASA 81 and a multivitamin).  He was feeling good and decided that he did not need them anymore.  Also c/o URI symptoms. Diuresed over 2 L with IV diuretics in the ER and started on lasix 20 mg daily.   Echo (12/16) showed EF 20-25% with diffuse hypokinesis (down from 35% in 12/15).  Echo 10/17 showed EF 15% with mildly decreased RV systolic function.    He had left and right heart catheterization in 12/17. This showed elevated right and left heart filling pressures and low cardiac output. The RCA was totally occluded with collaterals, there was 95% ostial to mid LAD stenosis.  Patient had PCI with DES to proximal-mid LAD.    He was noted to be in atrial fibrillation with RVR in 2/18 at cardiac rehab. He felt fatigued.  He was admitted and started on amiodarone for rate control. ASA was stopped, ticagrelor was decreased to 60 mg bid, and Xarelto 15 mg daily was started.  He converted spontaneously back to NSR.    He suffered a ventricular fibrillation arrest in 3/18.  Luckily, he was wearing  a Lifevest and was shocked.  He was admitted and started on amiodarone.  Medtronic ICD was placed.  Echo 3/18 with EF 20-25%.   Admitted again 6/18 due to VF arrest. ICD lead had been undersensing leading to prolongation of time to discharge.  He underwent RV lead extraction with new lead implant.   He was admitted again with VT storm in 7/18.  No prior chest pain or symptoms of worsening CAD.  He had VT ablation this admission.   Admitted with ICD shock 07/05/17. He had coronary angiography this admission.  Had cutting balloon angioplasty of severe in-stent restenosis in the proximal LAD. Pt had additional VT 07/07/17, EP saw and recommended BB titration. Po amio up-titrated. Discharge weight 228  lbs  He is generally doing ok today.   No dypsnea walking on flat ground.  No chest pain.  No orthopnea/PND.  Weight is up but he has been eating poorly.  Blood glucose seems to run generally about 200.  Recently, mass was found on a kidney.  He is going to have further evaluation of this by Dr. Junious Silk. He is limited somewhat by hip and knee pain.   Optivol: Personally reviewed.  No VT, no AF.  Impedance stable with fluid index < threshold.   Labs (12/16): K 4.9, creatinine 1.23 Labs (1/17): K 4.7, creatinine 1.15, BNP 1433 Labs (2/17): K 4.7, creatinine 1.14, BNP 870 Labs (3/17): K 4.5, creatinine 1.10, BNP 1257 Labs (5/17): K 4.2, creatinine 1.2, BNP 818 Labs (7/17): LDL 149, HDL 40, TGs 210 Labs (9/17): K 4.9, creatinine 1.36 Labs (10/17): K 3.7, creatinine 1.2, LDL 87, HDL 32, LFTs normal Labs (12/17): K 4.5, creatinine 1.34, digoxin 0.3 Labs (2/18): K 4.4, creatinine 1.48, hgb 11.8 Labs (3/18): K 5 => 4.6, creatinine 1.54 => 1.46, digoxin 1.3 => 0.3 Labs (5/18): TSH normal Labs (7/18): K 3.7, creatinine 1.33, hgb 11.4, digoxin 0.6, LFTs normal Labs (8/18): K 5.1, creatinine 1.47, hgb 13.3  ECG (9/18, personally reviewed): NSR, IVCD 142 msec  Past Medical History:  1. HYPERTENSION  2. HYPERLIPIDEMIA: Myalgias with atorvastatin and Crestor 3. ECZEMA  4. RHINITIS  5. HERPES ZOSTER OPHTHALMICUS  6. ADENOCARCINOMA, PROSTATE: Status post prostatectomy in 2009. Has had some incontinence since then.  7. Diabetes mellitus type II  8. Arthritis  9. Obesity  10. GERD: rare  11. CAD: Presented with exertional dyspnea, never had chest pain. LHC (2/12) with subtotalled proximal RCA and left to right collaterals, 90% proximal moderate-sized ramus, 95% proximal relatively small CFX, 40-50% proximal LAD. Cardiac MRI (2/12) showed EF 21%, some mild scar in basal segments but all wall segments would be expected to be viable. DES x 5 (overlapping) to RCA, DES x 1 to RI 01/30/11 .  Bled into leg  with Effient use (long, complicated course).  LHC (1/14): AV LCx small with subtotal occlusion, 95% ostial instent restenosis in RCA. PCI in 2/14 to ostial RCA with 3.5 x 15 Xience DES.  Plavix allergy (hives) so put on ticlopidine.  - LHC (12/17):  the RCA was totally occluded with collaterals, there was 95% ostial to mid LAD stenosis, patient had PCI with DES to proximal-mid LAD. - LHC (7/18): Totally occluded RCA with left to right collaterals, subtotal occlusion of small AV LCx, 90% ISR proximal LAD => cutting balloon angioplasty.  12. Ischemic CMP: Echo (2/12) with moderately dilated LV, EF about 20% with diffuse hypokinesis and inferior akinesis, pseudonormal diastolic function, mild MR, severe LAE, mildly decreased RV systolic  function. RHC (2/12) with mean RA 12, PA 40/25, mean PCWP 26, CI 2.1.  Echo (5/12) with EF 40% (appeared worse to my eye) with posterior HK, basal inferior AK, inferoseptal AK, basal anteroseptal AK, mild MR.  Cardiac MRI was repeated and showed EF 32% (improved from 21%) and mild LV dilation (was severely dilated before) with diffuse hypokinesis and subendocardial scar in the basal inferior, basal posterior, and basal anterolateral segments.  Echo (2/13) with EF 45%, moderate LV dilation, mild LVH.  Echo (1/14) with EF 15%, diffuse hypokinesis, inferoposterior akinesis, mild MR, grade I diastolic dysfunction.  Echo (5/14) with EF 30-35%, mild LV dilation, akinesis of the basal inferior wall otherwise diffuse hypokinesis.  Echo (12/14) with EF 30-35%, mild LV dilation, diffuse hypokinesis with basal inferior and posterior akinesis. Echo (12/15) with EF 35%, mildly dilated LV, wall motion abnormalities, normal RV size and systolic function. Echo (12/16) with EF 20-25%, diffuse hypokinesis, severe LV dilation, mild MR.  - Echo (10/17): EF 15%, grade II diastolic dysfunction, mild MR, mildly decreased RV systolic function.  - Possible angioedema related to Entresto.  - Hyperkalemia  with spironolactone 12.5 daily.  - CPX (10/17): peak VO2 10.6, VE/VCO2 slope 48.6, RER 1.12.  Severe HF limitation.  - RHC (12/17): mean RA 14, PA 53/27, mean PCWP 25, CI 1.93.  - Echo (3/18): EF 20-25%, moderate LV dilation, mild LVH, normal RV size and systolic function, mild aortic stenosis.  - Medtronic ICD 3/18.  - Echo (6/18): EF 25-30%, mild AS.  13. Cervical OA.  14. Polymyalgia rheumatica 15. OSA: Severe on sleep study 10/12.  On CPAP.  16. Left lower leg hematoma in setting of Effient use.  He developed a left gastrocnemius abscess and septic shock with prolonged hospitalization beginning in 7/13.  17. PAD: Lower extremity arterial doppler evaluation in 2/17 showed occluded peroneal arteries bilaterally, ABI 1.1 (R) and 1.0 (L).   18. Carotid dopplers (10/17) with minimal disease.  19. Atrial fibrillation: Paroxysmal.  20. Ventricular fibrillation arrest: 3/18.  Medtronic ICD placed, now on amiodarone.  - VT storm 7/18, now s/p VT ablation.  21. Aortic stenosis: Mild on 6/18 echo.  59. Kidney mass  Family History:  Father died with MI at age 76. He was an alcoholic. Mother died with cancer at around 11.   Social History:  Occupation: retired Administrator  Married, lives in [place]  Past smoker, quit around [place] of systems complete and found to be negative unless listed in HPI.    Current Outpatient Prescriptions  Medication Sig Dispense Refill  . amiodarone (PACERONE) 200 MG tablet Take 200 mg by mouth daily.    . carvedilol (COREG) 12.5 MG tablet Take 1 tablet (12.5 mg total) by mouth 2 (two) times daily. 60 tablet 6  . digoxin (LANOXIN) 0.125 MG tablet Take 0.5 tablets (0.0625 mg total) by mouth daily. 15 tablet 6  . diphenhydramine-acetaminophen (TYLENOL PM) 25-500 MG TABS tablet Take 1 tablet by mouth at bedtime.     . Evolocumab (REPATHA SURECLICK) 315 MG/ML SOAJ Inject 1 pen into the skin every 14 (fourteen) days.    . furosemide (LASIX) 20 MG tablet  Take 2 tablets (40 mg total) by mouth daily. 30 tablet 6  . glimepiride (AMARYL) 2 MG tablet TAKE 1 TABLET(2 MG) BY MOUTH DAILY BEFORE BREAKFAST 90 tablet 0  . losartan (COZAAR) 25 MG tablet Take 12.5 mg by mouth every evening.    . Multiple Vitamins-Minerals (CENTRUM SILVER ADULT 50+)  TABS Take 1 tablet by mouth daily.    . Omega-3 Fatty Acids (FISH OIL) 1000 MG CAPS Take 1,000 mg by mouth 2 (two) times daily.     . pravastatin (PRAVACHOL) 20 MG tablet Take 1 tablet (20 mg total) by mouth every evening. 30 tablet 5  . rivaroxaban (XARELTO) 20 MG TABS tablet Take 1 tablet (20 mg total) by mouth daily with supper. 30 tablet 3  . spironolactone (ALDACTONE) 25 MG tablet Take 0.5 tablets (12.5 mg total) by mouth every evening. 15 tablet 6  . traMADol (ULTRAM) 50 MG tablet Take 1 tablet (50 mg total) by mouth every 8 (eight) hours as needed for moderate pain. 20 tablet 0   No current facility-administered medications for this encounter.     BP 140/65   Pulse 63   Wt 230 lb (104.3 kg)   SpO2 97%   BMI 34.97 kg/m    Wt Readings from Last 3 Encounters:  09/14/17 230 lb (104.3 kg)  09/07/17 225 lb 12.8 oz (102.4 kg)  07/22/17 227 lb 12.8 oz (103.3 kg)   General: NAD Neck: No JVD, no thyromegaly or thyroid nodule.  Lungs: Clear to auscultation bilaterally with normal respiratory effort. CV: Nondisplaced PMI.  Heart regular S1/S2, no S3/S4, 2/6 early SEM with clear S2.  No peripheral edema.  No carotid bruit.  Normal pedal pulses.  Abdomen: Soft, nontender, no hepatosplenomegaly, no distention.  Skin: Intact without lesions or rashes.  Neurologic: Alert and oriented x 3.  Psych: Normal affect. Extremities: No clubbing or cyanosis.  HEENT: Normal.   Assessment/Plan:  1. Chronic systolic CHF: Ischemic cardiomyopathy.  6/18 echo with EF 25-30%, diffuse hypokinesis.  CPX in 10/17 with severe HF limitation.  RHC in 12/17 with elevated filling pressures and low cardiac output.  Medtronic ICD in  3/18 after vfib arrest. He had VT again in 6/18, then VT storm in 7/18 now s/p VT ablation.  NYHA class II symptoms currently, not volume overloaded.  - Continue Lasix 40 mg daily.  BMET today.   - Continue current Coreg and spironolactone.     - Increase losartan to 25 mg daily.  BMET 10 days.   - Continue digoxin 0.0625 daily, check level today.     - He may be an LVAD candidate in the future.   2. CAD:  Now s/p cutting balloon angioplasty to in-stent restenosis in proximal LAD.  He has an occluded RCA but there were left to right collaterals.  No chest pain.  - Continue pravastatin and Repatha.  Check lipids today.  - He can stop ticagrelor now and will increase Xarelto to 20 mg daily.  3. HLD: Continue Repatha and low dose pravastatin. Check lipids today.  4. PAD: Occluded peroneal arteries bilaterally.  No significant claudication.  5. Diabetes type II: Unable to get empagliflozin due to cost. Per PCP.  6. Atrial fibrillation: NSR by exam.  - Continue Xarelto 20 mg daily. - Continue amiodarone 200 mg daily   Check LFTs and TSH today.  He will need regular eye exams.  - Could consider atrial fibrillation ablation based on CASTLE-HF data if AF recurs. 7. Ventricular fibrillation arrest: On amiodarone and has Medtronic ICD.  He had VT ablation in 7/18 after episode of VT storm.  - No driving until 3/32.   - Now s/p angioplasty as above. Hope that this will decrease VT burden. No further VT on ICD interrogation.  8. OSA: Not using CPAP 9. CKD: Stage 3.  K has been upper normal to mildly elevated. Follow BMET closely with med changes.  He may end up needing Veltassa.    Followup in 2 months.   Loralie Champagne, MD  09/14/2017

## 2017-09-15 NOTE — Procedures (Signed)
EMG & NCV Findings: Evaluation of the left Dp Br Fibular motor nerve showed prolonged distal onset latency (Poplit, 5.9 ms).  The left fibular motor nerve showed no response (Ankle), no response (B Fib), and no response (Poplt).  The left tibial motor nerve showed no response (Ankle) and no response (Knee).  The left saphenous sensory nerve showed no response (14cm).  The left superficial fibular sensory nerve showed no response (14 cm).  The left sural sensory nerve showed no response (Calf).    Needle evaluation of the left anterior tibialis muscle showed decreased insertional activity, increased spontaneous activity, slightly increased polyphasic potentials, and diminished recruitment.  The left medial gastrocnemius and the left extensor hallucis longus muscles showed decreased insertional activity, slightly increased spontaneous activity, increased motor unit amplitude, and diminished recruitment.  All remaining muscles (as indicated in the following table) showed no evidence of electrical instability.    Impression: The above electrodiagnostic study is ABNORMAL but exceedingly hard to interpret and locate a specific nerve lesion.  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  However, it does appear that he likely has an underlying polyneuropathy and the study does seem to hint more at perhaps a lumbar chronic severe radiculopathy at L5 and S1 on the left.   Recommendations: 1.  Follow-up with referring physician. 2.  Continue current management of symptoms. 3.  Suggest lumbar CT due to defibrilator and rx for AFO.   Nerve Conduction Studies Anti Sensory Summary Table   Stim Site NR Peak (ms) Norm Peak (ms) P-T Amp (V) Norm P-T Amp Site1 Site2 Delta-P (ms) Dist (cm) Vel (m/s) Norm Vel (m/s)  Left Saphenous Anti Sensory (Ant Med Mall)  30.2C    14cm *NR  <4.4  >2 14cm Ant Med Mall  0.0  >32  Left Sup Fibular Anti Sensory (Ant Lat Mall)  30C  14 cm *NR  <4.4  >5.0 14 cm Ant Lat Mall  14.0  >32  Left Sural Anti Sensory (Lat Mall)  30.2C  Calf *NR  <4.0  >5.0 Calf Lat Mall  14.0  >35   Motor Summary Table   Stim Site NR Onset (ms) Norm Onset (ms) O-P Amp (mV) Norm O-P Amp Site1 Site2 Delta-0 (ms) Dist (cm) Vel (m/s) Norm Vel (m/s)  Left Dp Br Fibular Motor (AntTibialis)  29.7C  Fib Head    3.9 <4.2 1.5  Poplit Fib Head 2.0 8.0 40 >40  Poplit    *5.9 <5.7 2.2         Left Fibular Motor (Ext Dig Brev)  29.6C  Ankle *NR  <6.1  >2.5 B Fib Ankle  0.0  >38  B Fib *NR     Poplt B Fib  0.0  >40  Poplt *NR            Left Tibial Motor (Abd Hall Brev)  30C  Ankle *NR  <6.1  >3.0 Knee Ankle  36.5  >35  Knee *NR             EMG   Side Muscle Nerve Root Ins Act Fibs Psw Amp Dur Poly Recrt Int Fraser Din Comment  Left AntTibialis Dp Br Peron L4-5 *Decr *3+ *3+ Nml Nml *1+ *Reduced Nml   Left Fibularis Longus  Sup Br Peron L5-S1 Nml Nml Nml Nml Nml 0 Nml Nml   Left MedGastroc Tibial S1-2 *Decr *  1+ *1+ *Incr Nml 0 *Reduced Nml   Left VastusMed Femoral L2-4 Nml Nml Nml Nml Nml 0 Nml Nml   Left BicepsFemS Sciatic L5-S1 Nml Nml Nml Nml Nml 0 Nml Nml   Left ExtHallLong Dp Br Peron L5, S1 *Decr *1+ *1+ *Incr Nml 0 *Reduced Nml     Nerve Conduction Studies Anti Sensory Left/Right Comparison   Stim Site L Lat (ms) R Lat (ms) L-R Lat (ms) L Amp (V) R Amp (V) L-R Amp (%) Site1 Site2 L Vel (m/s) R Vel (m/s) L-R Vel (m/s)  Saphenous Anti Sensory (Ant Med Mall)  30.2C  14cm       14cm Ant Med Mall     Sup Fibular Anti Sensory (Ant Lat Mall)  30C  14 cm       14 cm Ant Lat Mall     Sural Anti Sensory (Lat Mall)  30.2C  Calf       Calf Lat Mall      Motor Left/Right Comparison   Stim Site L Lat (ms) R Lat (ms) L-R Lat (ms) L Amp (mV) R Amp (mV) L-R Amp (%) Site1 Site2 L Vel (m/s) R Vel (m/s) L-R Vel (m/s)  Dp Br Fibular Motor (AntTibialis)   29.7C  Fib Head 3.9   1.5   Poplit Fib Head 40    Poplit *5.9   2.2         Fibular Motor (Ext Dig Brev)  29.6C  Ankle       B Fib Ankle     B Fib       Poplt B Fib     Poplt             Tibial Motor (Abd Hall Brev)  30C  Ankle       Knee Ankle     Knee                Waveforms:

## 2017-09-15 NOTE — Progress Notes (Signed)
Wayne [name] - 76 y.o. male MRN [id number]  Date of birth: Jun 02, 1971  Office Visit Note: Visit Date: 09/09/2017 PCP: [name], MD Referred by: [name], MD  Subjective: Chief Complaint  Patient presents with  . Left Leg - Weakness, Pain   HPI: Wayne [name] is a 76 year old gentleman with pretty extensive cardiovascular history. He was recently seen Dondra Prader, FN-P 4 worsening left knee pain and noted tingling numbness in the anterior lateral shin with weakness of dorsiflexion in the left foot. He reported at the time to her that this was chronic but had been worsening. The patient is present with his wife today provided some of the history. Interestingly and from a complication standpoint he had a major surgery performed by Dr. Sharol Given in 2013. He had a hematoma in the popliteal fossa from anticoagulation. This turned into an abscess and he had debridement on a couple of occasions which is pretty extensive. There was a lot of her removal of muscle mass in the calf musculature on the left side. He is felt some weakness of the left leg since that time. However over the last few months he has had left leg pain and weakness. The pain is mostly in the left knee. He does get numbness and tingling down the front of the lower leg across the dorsolateral aspect of the [facility] top of the foot. He gets some occasional sharp pain in the foot. He has noticed when he walks he has to pick his foot up higher. He has not had any recent falls. His wife does notice his foot slapping. Dondra Prader, FN-P had offered them an AFO but they were looking at trying to find a source of his weakness. He is here today for electrodiagnostic study. The patient has had no prior electrodiagnostic study. He's had no prior imaging was lumbar spine. There is an MRI of the left leg from 2013 which is reviewed below.  He is a type II diabetic.  Last hemoglobin A1c from July of this year was 9.0.    ROS Otherwise per  HPI.  Assessment & Plan: Visit Diagnoses:  1. Paresthesia of skin   2. Weakness   3. Left foot drop     Plan: No additional findings.  Impression: The above electrodiagnostic study is ABNORMAL but exceedingly hard to interpret and locate a specific nerve lesion.  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  However, it does appear that he likely has an underlying polyneuropathy and the study does seem to hint more at perhaps a lumbar chronic severe radiculopathy at L5 and S1 on the left.   Recommendations: 1.  Follow-up with referring physician. 2.  Continue current management of symptoms. 3.  Suggest lumbar CT due to defibrilator and rx for AFO.    Meds & Orders: No orders of the defined types were placed in this encounter.   Orders Placed This Encounter  Procedures  . NCV with EMG (electromyography)    Follow-up: Return for Dondra Prader, FNP.   Procedures: No procedures performed  EMG & NCV Findings: Evaluation of the left Dp Br Fibular motor nerve showed prolonged distal onset latency (Poplit, 5.9 ms).  The left fibular motor nerve showed no response (Ankle), no response (B Fib), and no response (Poplt).  The left tibial motor nerve showed no response (Ankle) and no  response (Knee).  The left saphenous sensory nerve showed no response (14cm).  The left superficial fibular sensory nerve showed no response (14 cm).  The left sural sensory nerve showed no response (Calf).    Needle evaluation of the left anterior tibialis muscle showed decreased insertional activity, increased spontaneous activity, slightly increased polyphasic potentials, and diminished recruitment.  The left medial gastrocnemius and the left extensor hallucis longus muscles showed decreased insertional activity, slightly increased spontaneous activity, increased motor  unit amplitude, and diminished recruitment.  All remaining muscles (as indicated in the following table) showed no evidence of electrical instability.    Impression: The above electrodiagnostic study is ABNORMAL but exceedingly hard to interpret and locate a specific nerve lesion.  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  However, it does appear that he likely has an underlying polyneuropathy and the study does seem to hint more at perhaps a lumbar chronic severe radiculopathy at L5 and S1 on the left.   Recommendations: 1.  Follow-up with referring physician. 2.  Continue current management of symptoms. 3.  Suggest lumbar CT due to defibrilator and rx for AFO.   Nerve Conduction Studies Anti Sensory Summary Table   Stim Site NR Peak (ms) Norm Peak (ms) P-T Amp (V) Norm P-T Amp Site1 Site2 Delta-P (ms) Dist (cm) Vel (m/s) Norm Vel (m/s)  Left Saphenous Anti Sensory (Ant Med Mall)  30.2C  14cm *NR  <4.4  >2 14cm Ant Med Mall  0.0  >32  Left Sup Fibular Anti Sensory (Ant Lat Mall)  30C  14 cm *NR  <4.4  >5.0 14 cm Ant Lat Mall  14.0  >32  Left Sural Anti Sensory (Lat Mall)  30.2C  Calf *NR  <4.0  >5.0 Calf Lat Mall  14.0  >35   Motor Summary Table   Stim Site NR Onset (ms) Norm Onset (ms) O-P Amp (mV) Norm O-P Amp Site1 Site2 Delta-0 (ms) Dist (cm) Vel (m/s) Norm Vel (m/s)  Left Dp Br Fibular Motor (AntTibialis)  29.7C  Fib Head    3.9 <4.2 1.5  Poplit Fib Head 2.0 8.0 40 >40  Poplit    *5.9 <5.7 2.2         Left Fibular Motor (Ext Dig Brev)  29.6C  Ankle *NR  <6.1  >2.5 B Fib Ankle  0.0  >38  B Fib *NR     Poplt B Fib  0.0  >40  Poplt *NR            Left Tibial Motor (Abd Hall Brev)  30C  Ankle *NR  <6.1  >3.0 Knee Ankle  36.5  >35  Knee *NR             EMG   Side Muscle Nerve Root Ins Act Fibs Psw Amp Dur Poly Recrt Int  Fraser Din Comment  Left AntTibialis Dp Br Peron L4-5 *Decr *3+ *3+ Nml Nml *1+ *Reduced Nml   Left Fibularis Longus  Sup Br Peron L5-S1 Nml Nml Nml Nml Nml 0 Nml Nml   Left MedGastroc Tibial S1-2 *Decr *1+ *1+ *Incr Nml 0 *Reduced Nml   Left VastusMed Femoral L2-4 Nml Nml Nml Nml Nml 0 Nml Nml   Left BicepsFemS Sciatic L5-S1 Nml Nml Nml Nml Nml 0 Nml Nml   Left ExtHallLong Dp Br Peron L5, S1 *Decr *1+ *1+ *Incr Nml 0 *Reduced  Nml     Nerve Conduction Studies Anti Sensory Left/Right Comparison   Stim Site L Lat (ms) R Lat (ms) L-R Lat (ms) L Amp (V) R Amp (V) L-R Amp (%) Site1 Site2 L Vel (m/s) R Vel (m/s) L-R Vel (m/s)  Saphenous Anti Sensory (Ant Med Mall)  30.2C  14cm       14cm Ant Med Mall     Sup Fibular Anti Sensory (Ant Lat Mall)  30C  14 cm       14 cm Ant Lat Mall     Sural Anti Sensory (Lat Mall)  30.2C  Calf       Calf Lat Mall      Motor Left/Right Comparison   Stim Site L Lat (ms) R Lat (ms) L-R Lat (ms) L Amp (mV) R Amp (mV) L-R Amp (%) Site1 Site2 L Vel (m/s) R Vel (m/s) L-R Vel (m/s)  Dp Br Fibular Motor (AntTibialis)  29.7C  Fib Head 3.9   1.5   Poplit Fib Head 40    Poplit *5.9   2.2         Fibular Motor (Ext Dig Brev)  29.6C  Ankle       B Fib Ankle     B Fib       Poplt B Fib     Poplt             Tibial Motor (Abd Hall Brev)  30C  Ankle       Knee Ankle     Knee                Waveforms:             Clinical History: MRI LEFT LEG WITH AND WITHOUT CONTRAST 07/16/2012  Technique: Multiplanar, multisequence MR imaging of the left leg was performed before and after the administration of intravenous contrast.  Contrast: 52mL MULTIHANCE GADOBENATE DIMEGLUMINE 529 MG/ML IV SOLN  Comparison: CT 04/22/2012  Findings: Small peripherally enhancing fluid collection is present in the medial leg at the level of the proximal tibial metadiaphysis measuring 15 mm transverse, 39 mm AP and 5 cm cranial-caudal diffuse edema and muscular atrophy are  present in the left leg. This is in the region of the previously seen larger heterogeneous peripherally enhancing fluid collection (04/22/2012). There is a soft tissue defect along the medial aspect of the leg distal to the fluid collection.  There is no osteomyelitis or sinus tract formation. Achilles tendon appears intact. Infectious versus denervation myositis is present in the medial head of the gastrocnemius.  IMPRESSION: 1. Subcutaneous defect over the medial right leg compatible with debridement. 2. Small fluid collection in the superficial aspect of the medial head of the gastrocnemius measuring 15 mm x 39 mm x 5 cm cranial- caudal. This is most concerning for abscess with adjacent infectious myositis. Hematoma or seroma considered less likely. 3. Negative for osteomyelitis.  He reports that he quit smoking about 28 years ago. His smoking use included Cigarettes. He has a 6.00 pack-year smoking history. He has never used smokeless tobacco.   Recent Labs  05/22/17 1928 06/20/17 1007  HGBA1C 8.9* 9.0*    Objective:  VS:  HT:    WT:   BMI:     BP:   HR: bpm  TEMP: ( )  RESP:  Physical Exam  Ortho Exam Imaging: No results found.  Past Medical/Family/Surgical/Social History: Medications & Allergies reviewed per EMR Patient Active Problem List   Diagnosis Date  Noted  . Foot drop, left 08/24/2017  . Diabetes mellitus ([place])   . ICD (implantable cardioverter-defibrillator) discharge 07/05/2017  . Syncope and collapse   . Ventricular tachycardia ([place]) 06/18/2017  . Paroxysmal atrial fibrillation ([place]) 06/10/2017  . Hypoxemia   . VT (ventricular tachycardia) ([place])   . Cardiac arrest ([place]) 05/22/2017  . Defibrillator discharge 02/11/2017  . Atrial fibrillation with rapid ventricular response ([place]) 01/16/2017  . Coronary stent occlusion 11/18/2016  . Type 2 diabetes mellitus, uncontrolled ([place]) 09/30/2016  . CKD (chronic kidney disease) stage 3, GFR 30-59 ml/min  (HCC) 09/30/2016  . CAD (coronary artery disease) 11/20/2015  . Leucocytoclastic vasculitis ([place]) 06/25/2012  . Staphylococcus aureus bacteremia with sepsis ([place]) 06/18/2012  . NSTEMI, initial episode of care ([place]) 06/16/2012  . Cellulitis and abscess of lower leg 06/14/2012    Class: Acute  . Healthcare-associated pneumonia 06/14/2012    Class: Acute  . Hyperglycemia 06/14/2012    Class: Acute  . Leg swelling 04/22/2012  . Obesity 01/08/2012  . OSA (obstructive sleep apnea) 09/02/2011  . Systolic CHF, chronic ([place]) 06/10/2011  . Polymyalgia rheumatica ([place]) 06/02/2011  . HEMATOCHEZIA 02/10/2011  . BURSITIS, LEFT HIP 02/10/2011  . CORONARY ATHEROSCLEROSIS NATIVE CORONARY ARTERY 01/27/2011  . CARDIOMYOPATHY 01/21/2011  . CHEST PAIN UNSPECIFIED 01/20/2011  . DYSPNEA 01/16/2011  . ABDOMINAL PAIN, UNSPECIFIED SITE 01/13/2011  . ECZEMA 01/28/2010  . BRONCHITIS, ACUTE WITH MILD BRONCHOSPASM 12/21/2009  . HERPES ZOSTER OPHTHALMICUS 07/19/2009  . ADENOCARCINOMA, PROSTATE 06/20/2009  . Secondary DM with peripheral vascular disease, uncontrolled ([place]) 06/20/2009  . Hyperlipidemia 06/20/2009  . Essential hypertension 06/20/2009  . HYPERTROPHY PROSTATE W/UR OBST & OTH LUTS 06/20/2009   Past Medical History:  Diagnosis Date  . Adenocarcinoma of prostate ([place])    s/p seed implants  . Arthritis   . Cellulitis of left leg    a. 06/3418 complicated by septic shock  . Chronic combined systolic and diastolic CHF (congestive heart failure) ([place])   . Colon polyps   . CORONARY ATHEROSCLEROSIS NATIVE CORONARY ARTERY    a. 01/2011 Cath/PCI: LM nl, LAD 40-50p, D1 80-small, LCX 95-small, RI 90, RCA 100, EF 20%;  b. 01/2011 Card MRI - No transmural scar;  c. 01/2011 PCI RCA->5 Promus DES, RI->3.0x16 Promus DES; d. Cath 01/13/13 patent LAD & Ramus, diffuse LCx dz, RCA mult overlapping stents w/ 95% osital stenosis, EF 20%, s/p DES to ostial/prox RCA 01/24/13   . Diabetes mellitus, type 2 ([place])   . GERD  (gastroesophageal reflux disease)   . Hematoma of leg    a. left leg hematoma 03/2012 in the setting of asa/effient  . Herpes zoster ophthalmicus   . HYPERLIPIDEMIA    intolerant to Lipitor (myalgias)  . HYPERTENSION   . Ischemic cardiomyopathy    a. 01/2012 Echo EF 45%, mild LVH; b. FX90%, grade 1 diastolic dysfunction, diffuse hypokinesis, inferoposterior akinesis   . Noncompliance   . Obesity   . OSA (obstructive sleep apnea)   . Polymyalgia rheumatica (HCC)    Family History  Problem Relation Age of Onset  . Cancer Mother 64       unknown CA  . Heart disease Father   . Alcohol abuse Father   . Heart attack Father 49   Past Surgical History:  Procedure Laterality Date  . CARDIAC CATHETERIZATION  01/24/2013  . CARDIAC CATHETERIZATION N/A 11/14/2016   Procedure: Right/Left Heart Cath and Coronary Angiography;  Surgeon: Larey Dresser, MD;  Location: [place] [facility];  Service:  Cardiovascular;  Laterality: N/A;  . CARDIAC CATHETERIZATION N/A 11/17/2016   Procedure: Coronary Stent Intervention w/Impella;  Surgeon: Peter M Martinique, MD;  Location: [place] [facility];  Service: Cardiovascular;  Laterality: N/A;  . CARDIAC CATHETERIZATION N/A 11/17/2016   Procedure: Coronary Atherectomy;  Surgeon: Peter M Martinique, MD;  Location: [place] [facility];  Service: Cardiovascular;  Laterality: N/A;  . CORONARY ANGIOPLASTY WITH STENT PLACEMENT  01/24/2013   DES to RCA  . CORONARY BALLOON ANGIOPLASTY N/A 07/06/2017   Procedure: Coronary Balloon Angioplasty;  Surgeon: Sherren Mocha, MD;  Location: [place] [facility];  Service: Cardiovascular;  Laterality: N/A;  . CORONARY STENT PLACEMENT  2012   reports 6 stents placed  . I&D EXTREMITY  06/15/2012   Procedure: IRRIGATION AND DEBRIDEMENT EXTREMITY;  Surgeon: Newt Minion, MD;  Location: [place];  Service: Orthopedics;  Laterality: Left;  I&D Left Posterior Knee  . I&D EXTREMITY  06/30/2012   Procedure: IRRIGATION AND DEBRIDEMENT EXTREMITY;  Surgeon:  Newt Minion, MD;  Location: [place];  Service: Orthopedics;  Laterality: Left;  Left Leg Irrigation and Debridement and placement of Wound VAC and application of  A-cell  . I&D EXTREMITY  07/20/2012   Procedure: IRRIGATION AND DEBRIDEMENT EXTREMITY;  Surgeon: Newt Minion, MD;  Location: [place];  Service: Orthopedics;  Laterality: Left;  Irrigation and Debridement Left Leg and Place antibiotic beads   . ICD IMPLANT N/A 02/12/2017   Procedure: ICD Implant;  Surgeon: Evans Lance, MD;  Location: [place] [facility];  Service: Cardiovascular;  Laterality: N/A;  . LEAD REVISION/REPAIR N/A 05/26/2017   Procedure: Lead Revision/Repair;  Surgeon: Evans Lance, MD;  Location: [place] [facility];  Service: Cardiovascular;  Laterality: N/A;  . LEFT HEART CATH AND CORONARY ANGIOGRAPHY N/A 07/06/2017   Procedure: Left Heart Cath and Coronary Angiography;  Surgeon: Larey Dresser, MD;  Location: [place] [facility];  Service: Cardiovascular;  Laterality: N/A;  . PERCUTANEOUS CORONARY STENT INTERVENTION (PCI-S) N/A 01/24/2013   Procedure: PERCUTANEOUS CORONARY STENT INTERVENTION (PCI-S);  Surgeon: Wellington Hampshire, MD;  [facility];  Service: Cardiovascular;  Laterality: N/A;  . PROSTATE SURGERY     cancer, seed implant  . ULTRASOUND GUIDANCE FOR VASCULAR ACCESS  11/14/2016   Procedure: Ultrasound Guidance For Vascular Access;  Surgeon: Larey Dresser, MD;  Location: [place] [facility];  Service: Cardiovascular;;  . Stephanie Coup ABLATION N/A 06/19/2017   Procedure: Stephanie Coup Ablation;  Surgeon: Evans Lance, MD;  Location: [place] [facility];  Service: Cardiovascular;  Laterality: N/A;   Social History   Occupational History  . 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 Smoking status: Former Smoker    Packs/day: 0.30    Years: 20.00    Types: Cigarettes    Quit date: 02/23/1989  . Smokeless tobacco: Never Used  . Alcohol use No  . Drug use: No  . Sexual activity: Not on file

## 2017-09-18 ENCOUNTER — Other Ambulatory Visit (HOSPITAL_COMMUNITY): Payer: Self-pay | Admitting: Student

## 2017-09-21 ENCOUNTER — Ambulatory Visit (INDEPENDENT_AMBULATORY_CARE_PROVIDER_SITE_OTHER): Payer: Medicare Other | Admitting: Orthopedic Surgery

## 2017-09-21 ENCOUNTER — Encounter (INDEPENDENT_AMBULATORY_CARE_PROVIDER_SITE_OTHER): Payer: Self-pay | Admitting: Orthopedic Surgery

## 2017-09-21 DIAGNOSIS — I255 Ischemic cardiomyopathy: Secondary | ICD-10-CM | POA: Diagnosis not present

## 2017-09-21 DIAGNOSIS — M79605 Pain in left leg: Secondary | ICD-10-CM | POA: Diagnosis not present

## 2017-09-21 DIAGNOSIS — D414 Neoplasm of uncertain behavior of bladder: Secondary | ICD-10-CM | POA: Diagnosis not present

## 2017-09-21 DIAGNOSIS — R310 Gross hematuria: Secondary | ICD-10-CM | POA: Diagnosis not present

## 2017-09-21 DIAGNOSIS — M545 Low back pain, unspecified: Secondary | ICD-10-CM

## 2017-09-21 DIAGNOSIS — M21372 Foot drop, left foot: Secondary | ICD-10-CM

## 2017-09-21 NOTE — Progress Notes (Signed)
Office Visit Note   Patient: Wayne [name]           Date of Birth: 01-17-1941           MRN: [id number] Visit Date: 09/21/2017              Requested by: [name], MD [place], [place] [address] PCP: [name], MD  Chief Complaint  Patient presents with  . Left Knee - Pain, Follow-up      HPI: Patient presents in follow-up status post nerve conduction study for foot drop on the left. Patient states he's had chronic lower back pain with radicular symptoms down the left lower extremity also complains of left knee pain. He states that the previous injection helped for about 2 days. Patient complains of numbness on the dorsum of the left foot as well as foot drop on the left and radicular pain from the lumbar spine to the foot.  Patient states he is also being worked up for a mass in the right kidney is currently undergoing dye studies for evaluation and treatment.  Patient does have a defibrillator in place.  Assessment & Plan: Visit Diagnoses:  1. Low back pain radiating to left lower extremity   2. Foot drop, left     Plan: Recommended that he continue his workup for the kidney disorder. Discussed that once he is safe to proceed with a CT myelogram we will set him up for a CT myelogram he will call us if this time. We'll plan for the CT myelogram of the lumbar spine due to the fact he does have a defibrillator in place.  Follow-Up Instructions: Return if symptoms worsen or fail to improve.   Ortho Exam  Patient is alert, oriented, no adenopathy, well-dressed, normal affect, normal respiratory effort. Examination patient does have an antalgic gait he has a slight foot drop on the left. He has active motor strength with plantarflexion and dorsiflexion but does have weakness. He has active knee extension and flexion he has a negative straight leg raise subjectively numb on the dorsal lateral aspect of the foot. No pain with range of motion of  his hip. Nerve conduction studies were reviewed which showed a more proximal neuropathy.  Imaging: No results found. No images are attached to the encounter.  Labs: Lab Results  Component Value Date   HGBA1C 9.0 (H) 06/20/2017   HGBA1C 8.9 (H) 05/22/2017   HGBA1C 8.7 (H) 07/14/2016   ESRSEDRATE 46 (H) 07/22/2012   ESRSEDRATE 5 07/17/2012   ESRSEDRATE 51 (H) 07/03/2012   CRP 12.9 (H) 07/22/2012   CRP 2.5 (H) 07/17/2012   CRP 6.0 (H) 07/03/2012   LABURIC 11.6 (H) 06/25/2012   REPTSTATUS 05/27/2017 FINAL 05/22/2017   GRAMSTAIN  07/20/2012    NO WBC SEEN NO SQUAMOUS EPITHELIAL CELLS SEEN NO ORGANISMS SEEN   GRAMSTAIN  07/20/2012    NO WBC SEEN NO SQUAMOUS EPITHELIAL CELLS SEEN NO ORGANISMS SEEN   CULT NO GROWTH 5 DAYS 05/22/2017   LABORGA PSEUDOMONAS AERUGINOSA 07/20/2012    Orders:  No orders of the defined types were placed in this encounter.  No orders of the defined types were placed in this encounter.    Procedures: No procedures performed  Clinical Data: No additional findings.  ROS:  All other systems negative, except as noted in the HPI. Review of Systems  Objective: Vital Signs: There were no vitals taken for this visit.  Specialty Comments:  No specialty  comments available.  PMFS History: Patient Active Problem List   Diagnosis Date Noted  . Low back pain radiating to left lower extremity 09/21/2017  . Foot drop, left 08/24/2017  . Diabetes mellitus ([place])   . ICD (implantable cardioverter-defibrillator) discharge 07/05/2017  . Syncope and collapse   . Ventricular tachycardia ([place]) 06/18/2017  . Paroxysmal atrial fibrillation ([place]) 06/10/2017  . Hypoxemia   . VT (ventricular tachycardia) ([place])   . Cardiac arrest ([place]) 05/22/2017  . Defibrillator discharge 02/11/2017  . Atrial fibrillation with rapid ventricular response ([place]) 01/16/2017  . Coronary stent occlusion 11/18/2016  . Type 2 diabetes mellitus, uncontrolled ([place]) 09/30/2016  . CKD  (chronic kidney disease) stage 3, GFR 30-59 ml/min (HCC) 09/30/2016  . CAD (coronary artery disease) 11/20/2015  . Leucocytoclastic vasculitis ([place]) 06/25/2012  . Staphylococcus aureus bacteremia with sepsis ([place]) 06/18/2012  . NSTEMI, initial episode of care ([place]) 06/16/2012  . Cellulitis and abscess of lower leg 06/14/2012    Class: Acute  . Healthcare-associated pneumonia 06/14/2012    Class: Acute  . Hyperglycemia 06/14/2012    Class: Acute  . Leg swelling 04/22/2012  . Obesity 01/08/2012  . OSA (obstructive sleep apnea) 09/02/2011  . Systolic CHF, chronic ([place]) 06/10/2011  . Polymyalgia rheumatica ([place]) 06/02/2011  . HEMATOCHEZIA 02/10/2011  . BURSITIS, LEFT HIP 02/10/2011  . CORONARY ATHEROSCLEROSIS NATIVE CORONARY ARTERY 01/27/2011  . CARDIOMYOPATHY 01/21/2011  . CHEST PAIN UNSPECIFIED 01/20/2011  . DYSPNEA 01/16/2011  . ABDOMINAL PAIN, UNSPECIFIED SITE 01/13/2011  . ECZEMA 01/28/2010  . BRONCHITIS, ACUTE WITH MILD BRONCHOSPASM 12/21/2009  . HERPES ZOSTER OPHTHALMICUS 07/19/2009  . ADENOCARCINOMA, PROSTATE 06/20/2009  . Secondary DM with peripheral vascular disease, uncontrolled ([place]) 06/20/2009  . Hyperlipidemia 06/20/2009  . Essential hypertension 06/20/2009  . HYPERTROPHY PROSTATE W/UR OBST & OTH LUTS 06/20/2009   Past Medical History:  Diagnosis Date  . Adenocarcinoma of prostate ([place])    s/p seed implants  . Arthritis   . Cellulitis of left leg    a. 01/6711 complicated by septic shock  . Chronic combined systolic and diastolic CHF (congestive heart failure) ([place])   . Colon polyps   . CORONARY ATHEROSCLEROSIS NATIVE CORONARY ARTERY    a. 01/2011 Cath/PCI: LM nl, LAD 40-50p, D1 80-small, LCX 95-small, RI 90, RCA 100, EF 20%;  b. 01/2011 Card MRI - No transmural scar;  c. 01/2011 PCI RCA->5 Promus DES, RI->3.0x16 Promus DES; d. Cath 01/13/13 patent LAD & Ramus, diffuse LCx dz, RCA mult overlapping stents w/ 95% osital stenosis, EF 20%, s/p DES to ostial/prox RCA 01/24/13    . Diabetes mellitus, type 2 ([place])   . GERD (gastroesophageal reflux disease)   . Hematoma of leg    a. left leg hematoma 03/2012 in the setting of asa/effient  . Herpes zoster ophthalmicus   . HYPERLIPIDEMIA    intolerant to Lipitor (myalgias)  . HYPERTENSION   . Ischemic cardiomyopathy    a. 01/2012 Echo EF 45%, mild LVH; b. WP80%, grade 1 diastolic dysfunction, diffuse hypokinesis, inferoposterior akinesis   . Noncompliance   . Obesity   . OSA (obstructive sleep apnea)   . Polymyalgia rheumatica (HCC)     Family History  Problem Relation Age of Onset  . Cancer Mother 40       unknown CA  . Heart disease Father   . Alcohol abuse Father   . Heart attack Father 34    Past Surgical History:  Procedure Laterality Date  . CARDIAC CATHETERIZATION  01/24/2013  . CARDIAC  CATHETERIZATION N/A 11/14/2016   Procedure: Right/Left Heart Cath and Coronary Angiography;  Surgeon: Larey Dresser, MD;  Location: [facility];  Service: Cardiovascular;  Laterality: N/A;  . CARDIAC CATHETERIZATION N/A 11/17/2016   Procedure: Coronary Stent Intervention w/Impella;  Surgeon: Peter M Martinique, MD;  Location: [facility];  Service: Cardiovascular;  Laterality: N/A;  . CARDIAC CATHETERIZATION N/A 11/17/2016   Procedure: Coronary Atherectomy;  Surgeon: Peter M Martinique, MD;  Location: [facility];  Service: Cardiovascular;  Laterality: N/A;  . CORONARY ANGIOPLASTY WITH STENT PLACEMENT  01/24/2013   DES to RCA  . CORONARY BALLOON ANGIOPLASTY N/A 07/06/2017   Procedure: Coronary Balloon Angioplasty;  Surgeon: Sherren Mocha, MD;  Location: [facility];  Service: Cardiovascular;  Laterality: N/A;  . CORONARY STENT PLACEMENT  2012   reports 6 stents placed  . I&D EXTREMITY  06/15/2012   Procedure: IRRIGATION AND DEBRIDEMENT EXTREMITY;  Surgeon: Newt Minion, MD;  Location: [facility];  Service: Orthopedics;  Laterality: Left;  I&D Left Posterior Knee  . I&D EXTREMITY  06/30/2012    Procedure: IRRIGATION AND DEBRIDEMENT EXTREMITY;  Surgeon: Newt Minion, MD;  Location: [facility];  Service: Orthopedics;  Laterality: Left;  Left Leg Irrigation and Debridement and placement of Wound VAC and application of  A-cell  . I&D EXTREMITY  07/20/2012   Procedure: IRRIGATION AND DEBRIDEMENT EXTREMITY;  Surgeon: Newt Minion, MD;  Location: [facility];  Service: Orthopedics;  Laterality: Left;  Irrigation and Debridement Left Leg and Place antibiotic beads   . ICD IMPLANT N/A 02/12/2017   Procedure: ICD Implant;  Surgeon: Evans Lance, MD;  Location: [facility];  Service: Cardiovascular;  Laterality: N/A;  . LEAD REVISION/REPAIR N/A 05/26/2017   Procedure: Lead Revision/Repair;  Surgeon: Evans Lance, MD;  Location: [facility];  Service: Cardiovascular;  Laterality: N/A;  . LEFT HEART CATH AND CORONARY ANGIOGRAPHY N/A 07/06/2017   Procedure: Left Heart Cath and Coronary Angiography;  Surgeon: Larey Dresser, MD;  Location: [facility];  Service: Cardiovascular;  Laterality: N/A;  . PERCUTANEOUS CORONARY STENT INTERVENTION (PCI-S) N/A 01/24/2013   Procedure: PERCUTANEOUS CORONARY STENT INTERVENTION (PCI-S);  Surgeon: Wellington Hampshire, MD;  Location: [facility] CATH LAB;  Service: Cardiovascular;  Laterality: N/A;  . PROSTATE SURGERY     cancer, seed implant  . ULTRASOUND GUIDANCE FOR VASCULAR ACCESS  11/14/2016   Procedure: Ultrasound Guidance For Vascular Access;  Surgeon: Larey Dresser, MD;  Location: [facility];  Service: Cardiovascular;;  . Stephanie Coup ABLATION N/A 06/19/2017   Procedure: Stephanie Coup Ablation;  Surgeon: Evans Lance, MD;  Location: [facility];  Service: Cardiovascular;  Laterality: N/A;   Social History   Occupational History  . 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 Smoking status: Former Smoker    Packs/day: 0.30    Years: 20.00    Types: Cigarettes    Quit date: 02/23/1989  . Smokeless tobacco: Never Used  .  Alcohol use No  . Drug use: No  . Sexual activity: Not on file

## 2017-09-23 ENCOUNTER — Telehealth (INDEPENDENT_AMBULATORY_CARE_PROVIDER_SITE_OTHER): Payer: Self-pay | Admitting: Orthopedic Surgery

## 2017-09-23 NOTE — Telephone Encounter (Signed)
I called and spoke with patient, asked if he heard anything about being able to proceed, hx of kidney disease. He stated he just found out he has cancer today, he is going to follow up, explained the contrast with CT scan could effect his kidneys and this is what Dr. Sharol Given was concerned about. He will call us back once he finds out more. We will hold on CT myelogram for now.

## 2017-09-23 NOTE — Telephone Encounter (Signed)
Patient states they would like to have the CT Scan sch'd @ [facility].

## 2017-09-28 ENCOUNTER — Ambulatory Visit (HOSPITAL_COMMUNITY)
Admission: RE | Admit: 2017-09-28 | Discharge: 2017-09-28 | Disposition: A | Discharge status: Home / Self Care | Payer: Medicare Other | Source: Ambulatory Visit | Attending: Cardiology | Admitting: Cardiology

## 2017-09-28 ENCOUNTER — Telehealth (HOSPITAL_COMMUNITY): Payer: Self-pay | Admitting: *Deleted

## 2017-09-28 DIAGNOSIS — I5022 Chronic systolic (congestive) heart failure: Secondary | ICD-10-CM

## 2017-09-28 LAB — BASIC METABOLIC PANEL
Anion gap: 9 (ref 5–15)
BUN: 20 mg/dL (ref 6–20)
CALCIUM: 9 mg/dL (ref 8.9–10.3)
CHLORIDE: 104 mmol/L (ref 101–111)
CO2: 25 mmol/L (ref 22–32)
CREATININE: 1.3 mg/dL — AB (ref 0.61–1.24)
GFR calc Af Amer: >60 mL/min (ref >60)
GFR calc non Af Amer: 53 mL/min — ABNORMAL LOW (ref >60)
GLUCOSE: 259 mg/dL — AB (ref 65–99)
Potassium: 4.2 mmol/L (ref 3.5–5.1)
Sodium: 138 mmol/L (ref 135–145)

## 2017-09-28 NOTE — Telephone Encounter (Signed)
Patient filled out a walk-in form during his lab visit asking for the triage nurse to call regarding some questions about his blood thinners.  I tried calling the patient back but had to leave a message.

## 2017-10-06 ENCOUNTER — Other Ambulatory Visit: Payer: Self-pay | Admitting: Urology

## 2017-10-08 MED ORDER — CIPROFLOXACIN IN D5W 400 MG/200ML IV SOLN: 400.0000 mg | Freq: Once | INTRAVENOUS | Status: DC

## 2017-10-08 MED ORDER — SODIUM CHLORIDE 0.9 % IV SOLN: 50.0000 mg | Freq: Once | INTRAVENOUS | Status: DC

## 2017-10-19 ENCOUNTER — Other Ambulatory Visit: Payer: Self-pay | Admitting: Internal Medicine

## 2017-10-20 ENCOUNTER — Other Ambulatory Visit (HOSPITAL_COMMUNITY): Payer: Self-pay | Admitting: Student

## 2017-10-23 NOTE — Patient Instructions (Addendum)
Wayne [name]  10/23/2017   Your procedure is scheduled on: 10-30-17   Report to [facility] Main  Entrance Take [place] Elevators to 3rd floor to  [place] at 5:30 AM.   Call this number if you have problems the morning of surgery [phone]    Remember: ONLY 1 PERSON MAY GO WITH YOU TO SHORT STAY TO GET  READY MORNING OF Wayne [name].  Do not eat food or drink liquids :After Midnight.     Take these medicines the morning of surgery with A SIP OF WATER: Amiodarone (Pacerone), Carvedilol (Coreg), and Digoxin             DO NOT TAKE ANY DIABETIC MEDICATION ON THE DAY OF SURGERY                               You may not have any metal on your body including hair pins and              piercings  Do not wear jewelry, lotions, powders or deodorant             Men may shave face and neck.   Do not bring valuables to the hospital. [name].  Contacts, dentures or bridgework may not be worn into surgery.     Patients discharged the day of surgery will not be allowed to drive home.  Name and phone number of your driver: Wayne [name] [phone]              Please read over the following fact sheets you were given: _____________________________________________________________________  How to Manage Your Diabetes Before and After Surgery  Why is it important to control my blood sugar before and after surgery? . Improving blood sugar levels before and after surgery helps healing and can limit problems. . A way of improving blood sugar control is eating a healthy diet by: o  Eating less sugar and carbohydrates o  Increasing activity/exercise o  Talking with your doctor about reaching your blood sugar goals . High blood sugars (greater than 180 mg/dL) can raise your risk of infections and slow your recovery, so you will need to focus on controlling your diabetes during the weeks before surgery. . Make sure that the  doctor who takes care of your diabetes knows about your planned surgery including the date and location.  How do I manage my blood sugar before surgery? . Check your blood sugar at least 4 times a day, starting 2 days before surgery, to make sure that the level is not too high or low. o Check your blood sugar the morning of your surgery when you wake up and every 2 hours until you get to the Short Stay unit. . If your blood sugar is less than 70 mg/dL, you will need to treat for low blood sugar: o Do not take insulin. o Treat a low blood sugar (less than 70 mg/dL) with  cup of clear juice (cranberry or apple), 4 glucose tablets, OR glucose gel. o Recheck blood sugar in 15 minutes after treatment (to make sure it is greater than 70 mg/dL). If your blood sugar is not greater than 70 mg/dL on recheck, call [phone] for further instructions. . Report  your blood sugar to the short stay nurse when you get to Short Stay.  . If you are admitted to the hospital after surgery: o Your blood sugar will be checked by the staff and you will probably be given insulin after surgery (instead of oral diabetes medicines) to make sure you have good blood sugar levels. o The goal for blood sugar control after surgery is 80-180 mg/dL.   WHAT DO I DO ABOUT MY DIABETES MEDICATION?  Marland Kitchen Do not take oral diabetes medicines (pills) the morning of surgery.  . THE DAY BEFORE SURGERY, take your usual morning dose of Glimepiride (Amaryl)        Patient Signature:  Date:   Nurse Signature:  Date:   Reviewed and Endorsed by [facility] Patient Education Committee, August 2015           [facility] - Preparing for Surgery Before surgery, you can play an important role.  Because skin is not sterile, your skin needs to be as free of germs as possible.  You can reduce the number of germs on your skin by washing with CHG (chlorahexidine gluconate) soap before surgery.  CHG is an antiseptic cleaner which kills germs and  bonds with the skin to continue killing germs even after washing. Please DO NOT use if you have an allergy to CHG or antibacterial soaps.  If your skin becomes reddened/irritated stop using the CHG and inform your nurse when you arrive at Short Stay. Do not shave (including legs and underarms) for at least 48 hours prior to the first CHG shower.  You may shave your face/neck. Please follow these instructions carefully:  1.  Shower with CHG Soap the night before surgery and the  morning of Surgery.  2.  If you choose to wash your hair, wash your hair first as usual with your  normal  shampoo.  3.  After you shampoo, rinse your hair and body thoroughly to remove the  shampoo.                           4.  Use CHG as you would any other liquid soap.  You can apply chg directly  to the skin and wash                       Gently with a scrungie or clean washcloth.  5.  Apply the CHG Soap to your body ONLY FROM THE NECK DOWN.   Do not use on face/ open                           Wound or open sores. Avoid contact with eyes, ears mouth and genitals (private parts).                       Wash face,  Genitals (private parts) with your normal soap.             6.  Wash thoroughly, paying special attention to the area where your surgery  will be performed.  7.  Thoroughly rinse your body with warm water from the neck down.  8.  DO NOT shower/wash with your normal soap after using and rinsing off  the CHG Soap.                9.  Pat yourself dry with a clean towel.  10.  Wear clean pajamas.            11.  Place clean sheets on your bed the night of your first shower and do not  sleep with pets. Day of Surgery : Do not apply any lotions/deodorants the morning of surgery.  Please wear clean clothes to the hospital/surgery center.  FAILURE TO FOLLOW THESE INSTRUCTIONS MAY RESULT IN THE CANCELLATION OF YOUR SURGERY PATIENT SIGNATURE_________________________________  NURSE  SIGNATURE__________________________________  ________________________________________________________________________

## 2017-10-23 NOTE — Progress Notes (Signed)
09-07-17 (EPIC) EKG   06-18-17 (EPIC) CXR  05-23-17 (EPIC) ECHO

## 2017-10-26 ENCOUNTER — Other Ambulatory Visit: Payer: Self-pay | Admitting: Cardiology

## 2017-10-27 ENCOUNTER — Encounter (HOSPITAL_COMMUNITY): Payer: Self-pay

## 2017-10-27 ENCOUNTER — Other Ambulatory Visit: Payer: Self-pay

## 2017-10-27 ENCOUNTER — Encounter (HOSPITAL_COMMUNITY)
Admission: RE | Admit: 2017-10-27 | Discharge: 2017-10-27 | Disposition: A | Discharge status: Home / Self Care | Payer: Medicare Other | Source: Ambulatory Visit | Attending: Urology | Admitting: Urology

## 2017-10-27 DIAGNOSIS — E669 Obesity, unspecified: Secondary | ICD-10-CM | POA: Diagnosis not present

## 2017-10-27 DIAGNOSIS — K219 Gastro-esophageal reflux disease without esophagitis: Secondary | ICD-10-CM | POA: Diagnosis not present

## 2017-10-27 DIAGNOSIS — Z955 Presence of coronary angioplasty implant and graft: Secondary | ICD-10-CM | POA: Diagnosis not present

## 2017-10-27 DIAGNOSIS — I255 Ischemic cardiomyopathy: Secondary | ICD-10-CM | POA: Diagnosis not present

## 2017-10-27 DIAGNOSIS — Z87891 Personal history of nicotine dependence: Secondary | ICD-10-CM | POA: Diagnosis not present

## 2017-10-27 DIAGNOSIS — D494 Neoplasm of unspecified behavior of bladder: Secondary | ICD-10-CM | POA: Diagnosis present

## 2017-10-27 DIAGNOSIS — E785 Hyperlipidemia, unspecified: Secondary | ICD-10-CM | POA: Diagnosis not present

## 2017-10-27 DIAGNOSIS — Z8546 Personal history of malignant neoplasm of prostate: Secondary | ICD-10-CM | POA: Diagnosis not present

## 2017-10-27 DIAGNOSIS — Z888 Allergy status to other drugs, medicaments and biological substances status: Secondary | ICD-10-CM | POA: Diagnosis not present

## 2017-10-27 DIAGNOSIS — M1990 Unspecified osteoarthritis, unspecified site: Secondary | ICD-10-CM | POA: Diagnosis not present

## 2017-10-27 DIAGNOSIS — I110 Hypertensive heart disease with heart failure: Secondary | ICD-10-CM | POA: Diagnosis not present

## 2017-10-27 DIAGNOSIS — Z8249 Family history of ischemic heart disease and other diseases of the circulatory system: Secondary | ICD-10-CM | POA: Diagnosis not present

## 2017-10-27 DIAGNOSIS — Z6835 Body mass index (BMI) 35.0-35.9, adult: Secondary | ICD-10-CM | POA: Diagnosis not present

## 2017-10-27 DIAGNOSIS — I252 Old myocardial infarction: Secondary | ICD-10-CM | POA: Diagnosis not present

## 2017-10-27 DIAGNOSIS — Z7984 Long term (current) use of oral hypoglycemic drugs: Secondary | ICD-10-CM | POA: Diagnosis not present

## 2017-10-27 DIAGNOSIS — Z86010 Personal history of colonic polyps: Secondary | ICD-10-CM | POA: Diagnosis not present

## 2017-10-27 DIAGNOSIS — I2510 Atherosclerotic heart disease of native coronary artery without angina pectoris: Secondary | ICD-10-CM | POA: Diagnosis not present

## 2017-10-27 DIAGNOSIS — C678 Malignant neoplasm of overlapping sites of bladder: Secondary | ICD-10-CM | POA: Diagnosis not present

## 2017-10-27 DIAGNOSIS — I472 Ventricular tachycardia: Secondary | ICD-10-CM | POA: Diagnosis not present

## 2017-10-27 DIAGNOSIS — Z79899 Other long term (current) drug therapy: Secondary | ICD-10-CM | POA: Diagnosis not present

## 2017-10-27 DIAGNOSIS — E119 Type 2 diabetes mellitus without complications: Secondary | ICD-10-CM | POA: Diagnosis not present

## 2017-10-27 DIAGNOSIS — M353 Polymyalgia rheumatica: Secondary | ICD-10-CM | POA: Diagnosis not present

## 2017-10-27 DIAGNOSIS — Z7901 Long term (current) use of anticoagulants: Secondary | ICD-10-CM | POA: Diagnosis not present

## 2017-10-27 DIAGNOSIS — G4733 Obstructive sleep apnea (adult) (pediatric): Secondary | ICD-10-CM | POA: Diagnosis not present

## 2017-10-27 DIAGNOSIS — I5042 Chronic combined systolic (congestive) and diastolic (congestive) heart failure: Secondary | ICD-10-CM | POA: Diagnosis not present

## 2017-10-27 LAB — HEMOGLOBIN A1C
Hgb A1c MFr Bld: 10.3 % — ABNORMAL HIGH (ref 4.8–5.6)
Mean Plasma Glucose: 248.91 mg/dL

## 2017-10-27 LAB — BASIC METABOLIC PANEL
Anion gap: 8 (ref 5–15)
BUN: 26 mg/dL — ABNORMAL HIGH (ref 6–20)
CALCIUM: 9.8 mg/dL (ref 8.9–10.3)
CHLORIDE: 101 mmol/L (ref 101–111)
CO2: 30 mmol/L (ref 22–32)
CREATININE: 1.42 mg/dL — AB (ref 0.61–1.24)
GFR, EST AFRICAN AMERICAN: 55 mL/min — AB (ref >60)
GFR, EST NON AFRICAN AMERICAN: 47 mL/min — AB (ref >60)
Glucose, Bld: 324 mg/dL — ABNORMAL HIGH (ref 65–99)
Potassium: 5.9 mmol/L — ABNORMAL HIGH (ref 3.5–5.1)
SODIUM: 139 mmol/L (ref 135–145)

## 2017-10-27 LAB — CBC
HCT: 46.6 % (ref 39.0–52.0)
Hemoglobin: 15.2 g/dL (ref 13.0–17.0)
MCH: 28.8 pg (ref 26.0–34.0)
MCHC: 32.6 g/dL (ref 30.0–36.0)
MCV: 88.3 fL (ref 78.0–100.0)
PLATELETS: 167 10*3/uL (ref 150–400)
RBC: 5.28 MIL/uL (ref 4.22–5.81)
RDW: 14.3 % (ref 11.5–15.5)
WBC: 6.4 10*3/uL (ref 4.0–10.5)

## 2017-10-27 LAB — GLUCOSE, CAPILLARY: Glucose-Capillary: 279 mg/dL — ABNORMAL HIGH (ref 65–99)

## 2017-10-27 NOTE — Progress Notes (Signed)
10-27-17 BMP and HGA1C results routed to Dr. Junious Silk for review.

## 2017-10-27 NOTE — Progress Notes (Addendum)
09-07-17 (EPIC) EKG  06-18-17 (EPIC) CXR  05-23-17 (EPIC) ECHO  Spoke to Dr. Annye Asa, Anesthesiologist regarding ECHO results, and pt's prior cardiac history. Per Dr. Rogelio Seen, pt is okay to proceed with surgery.

## 2017-10-30 ENCOUNTER — Ambulatory Visit (HOSPITAL_COMMUNITY): Payer: Medicare Other | Admitting: Certified Registered Nurse Anesthetist

## 2017-10-30 ENCOUNTER — Ambulatory Visit (HOSPITAL_COMMUNITY)
Admission: RE | Admit: 2017-10-30 | Discharge: 2017-10-30 | Disposition: A | Discharge status: Home / Self Care | Payer: Medicare Other | Source: Ambulatory Visit | Attending: Urology | Admitting: Urology

## 2017-10-30 ENCOUNTER — Other Ambulatory Visit: Payer: Self-pay

## 2017-10-30 ENCOUNTER — Encounter (HOSPITAL_COMMUNITY)
Admission: RE | Disposition: A | Discharge status: Home / Self Care | Payer: Self-pay | Source: Ambulatory Visit | Attending: Urology

## 2017-10-30 ENCOUNTER — Encounter (HOSPITAL_COMMUNITY): Payer: Self-pay | Admitting: *Deleted

## 2017-10-30 DIAGNOSIS — I110 Hypertensive heart disease with heart failure: Secondary | ICD-10-CM | POA: Diagnosis not present

## 2017-10-30 DIAGNOSIS — E119 Type 2 diabetes mellitus without complications: Secondary | ICD-10-CM | POA: Insufficient documentation

## 2017-10-30 DIAGNOSIS — E669 Obesity, unspecified: Secondary | ICD-10-CM | POA: Insufficient documentation

## 2017-10-30 DIAGNOSIS — Z6835 Body mass index (BMI) 35.0-35.9, adult: Secondary | ICD-10-CM | POA: Insufficient documentation

## 2017-10-30 DIAGNOSIS — D494 Neoplasm of unspecified behavior of bladder: Secondary | ICD-10-CM | POA: Diagnosis not present

## 2017-10-30 DIAGNOSIS — N3020 Other chronic cystitis without hematuria: Secondary | ICD-10-CM | POA: Diagnosis not present

## 2017-10-30 DIAGNOSIS — M1990 Unspecified osteoarthritis, unspecified site: Secondary | ICD-10-CM | POA: Insufficient documentation

## 2017-10-30 DIAGNOSIS — Z7984 Long term (current) use of oral hypoglycemic drugs: Secondary | ICD-10-CM | POA: Insufficient documentation

## 2017-10-30 DIAGNOSIS — C678 Malignant neoplasm of overlapping sites of bladder: Secondary | ICD-10-CM | POA: Diagnosis not present

## 2017-10-30 DIAGNOSIS — K219 Gastro-esophageal reflux disease without esophagitis: Secondary | ICD-10-CM | POA: Diagnosis not present

## 2017-10-30 DIAGNOSIS — I255 Ischemic cardiomyopathy: Secondary | ICD-10-CM | POA: Diagnosis not present

## 2017-10-30 DIAGNOSIS — I252 Old myocardial infarction: Secondary | ICD-10-CM | POA: Insufficient documentation

## 2017-10-30 DIAGNOSIS — Z8249 Family history of ischemic heart disease and other diseases of the circulatory system: Secondary | ICD-10-CM | POA: Insufficient documentation

## 2017-10-30 DIAGNOSIS — I472 Ventricular tachycardia: Secondary | ICD-10-CM | POA: Insufficient documentation

## 2017-10-30 DIAGNOSIS — I5042 Chronic combined systolic (congestive) and diastolic (congestive) heart failure: Secondary | ICD-10-CM | POA: Insufficient documentation

## 2017-10-30 DIAGNOSIS — Z87891 Personal history of nicotine dependence: Secondary | ICD-10-CM | POA: Insufficient documentation

## 2017-10-30 DIAGNOSIS — I2510 Atherosclerotic heart disease of native coronary artery without angina pectoris: Secondary | ICD-10-CM | POA: Diagnosis not present

## 2017-10-30 DIAGNOSIS — I5022 Chronic systolic (congestive) heart failure: Secondary | ICD-10-CM | POA: Diagnosis not present

## 2017-10-30 DIAGNOSIS — Z888 Allergy status to other drugs, medicaments and biological substances status: Secondary | ICD-10-CM | POA: Insufficient documentation

## 2017-10-30 DIAGNOSIS — G4733 Obstructive sleep apnea (adult) (pediatric): Secondary | ICD-10-CM | POA: Insufficient documentation

## 2017-10-30 DIAGNOSIS — M353 Polymyalgia rheumatica: Secondary | ICD-10-CM | POA: Diagnosis not present

## 2017-10-30 DIAGNOSIS — E785 Hyperlipidemia, unspecified: Secondary | ICD-10-CM | POA: Diagnosis not present

## 2017-10-30 DIAGNOSIS — R310 Gross hematuria: Secondary | ICD-10-CM | POA: Diagnosis not present

## 2017-10-30 DIAGNOSIS — Z955 Presence of coronary angioplasty implant and graft: Secondary | ICD-10-CM | POA: Insufficient documentation

## 2017-10-30 DIAGNOSIS — Z7901 Long term (current) use of anticoagulants: Secondary | ICD-10-CM | POA: Insufficient documentation

## 2017-10-30 DIAGNOSIS — Z86010 Personal history of colonic polyps: Secondary | ICD-10-CM | POA: Insufficient documentation

## 2017-10-30 DIAGNOSIS — C679 Malignant neoplasm of bladder, unspecified: Secondary | ICD-10-CM | POA: Diagnosis not present

## 2017-10-30 DIAGNOSIS — N183 Chronic kidney disease, stage 3 (moderate): Secondary | ICD-10-CM | POA: Diagnosis not present

## 2017-10-30 DIAGNOSIS — I130 Hypertensive heart and chronic kidney disease with heart failure and stage 1 through stage 4 chronic kidney disease, or unspecified chronic kidney disease: Secondary | ICD-10-CM | POA: Diagnosis not present

## 2017-10-30 DIAGNOSIS — Z79899 Other long term (current) drug therapy: Secondary | ICD-10-CM | POA: Insufficient documentation

## 2017-10-30 DIAGNOSIS — C674 Malignant neoplasm of posterior wall of bladder: Secondary | ICD-10-CM | POA: Diagnosis not present

## 2017-10-30 DIAGNOSIS — Z8546 Personal history of malignant neoplasm of prostate: Secondary | ICD-10-CM | POA: Insufficient documentation

## 2017-10-30 HISTORY — PX: TRANSURETHRAL RESECTION OF BLADDER TUMOR: SHX2575

## 2017-10-30 LAB — GLUCOSE, CAPILLARY
GLUCOSE-CAPILLARY: 281 mg/dL — AB (ref 65–99)
Glucose-Capillary: 285 mg/dL — ABNORMAL HIGH (ref 65–99)

## 2017-10-30 SURGERY — TURBT (TRANSURETHRAL RESECTION OF BLADDER TUMOR)
Anesthesia: General

## 2017-10-30 MED ORDER — ETOMIDATE 2 MG/ML IV SOLN
INTRAVENOUS | Status: AC
  Filled 2017-10-30: qty 10

## 2017-10-30 MED ORDER — LIDOCAINE 2% (20 MG/ML) 5 ML SYRINGE
INTRAMUSCULAR | Status: AC
  Filled 2017-10-30: qty 5

## 2017-10-30 MED ORDER — EPHEDRINE SULFATE-NACL 50-0.9 MG/10ML-% IV SOSY
PREFILLED_SYRINGE | INTRAVENOUS | Status: DC | PRN
  Administered 2017-10-30: 10 mg via INTRAVENOUS
  Administered 2017-10-30: 15 mg via INTRAVENOUS

## 2017-10-30 MED ORDER — HYDROMORPHONE HCL 1 MG/ML IJ SOLN
INTRAMUSCULAR | Status: AC
  Filled 2017-10-30: qty 1

## 2017-10-30 MED ORDER — ONDANSETRON HCL 4 MG/2ML IJ SOLN
INTRAMUSCULAR | Status: DC | PRN
  Administered 2017-10-30: 4 mg via INTRAVENOUS

## 2017-10-30 MED ORDER — PHENYLEPHRINE 40 MCG/ML (10ML) SYRINGE FOR IV PUSH (FOR BLOOD PRESSURE SUPPORT)
PREFILLED_SYRINGE | INTRAVENOUS | Status: DC | PRN
  Administered 2017-10-30 (×3): 80 ug via INTRAVENOUS

## 2017-10-30 MED ORDER — PROPOFOL 10 MG/ML IV BOLUS
INTRAVENOUS | Status: DC | PRN
  Administered 2017-10-30: 100 mg via INTRAVENOUS

## 2017-10-30 MED ORDER — MEPERIDINE HCL 50 MG/ML IJ SOLN: 6.2500 mg | INTRAMUSCULAR | Status: DC | PRN

## 2017-10-30 MED ORDER — ONDANSETRON HCL 4 MG/2ML IJ SOLN: 4.0000 mg | Freq: Once | INTRAMUSCULAR | Status: DC | PRN

## 2017-10-30 MED ORDER — LACTATED RINGERS IV SOLN
INTRAVENOUS | Status: DC | PRN
  Administered 2017-10-30: 07:00:00 via INTRAVENOUS

## 2017-10-30 MED ORDER — ONDANSETRON HCL 4 MG/2ML IJ SOLN
INTRAMUSCULAR | Status: AC
  Filled 2017-10-30: qty 2

## 2017-10-30 MED ORDER — EPHEDRINE 5 MG/ML INJ
INTRAVENOUS | Status: AC
  Filled 2017-10-30: qty 10

## 2017-10-30 MED ORDER — SODIUM CHLORIDE 0.9 % IR SOLN
Status: DC | PRN
  Administered 2017-10-30: 6000 mL via INTRAVESICAL

## 2017-10-30 MED ORDER — LIDOCAINE 2% (20 MG/ML) 5 ML SYRINGE
INTRAMUSCULAR | Status: DC | PRN
  Administered 2017-10-30: 100 mg via INTRAVENOUS

## 2017-10-30 MED ORDER — CARVEDILOL 12.5 MG PO TABS
12.5000 mg | ORAL_TABLET | Freq: Once | ORAL | Status: AC
  Administered 2017-10-30: 12.5 mg via ORAL
  Filled 2017-10-30: qty 1

## 2017-10-30 MED ORDER — CEFAZOLIN SODIUM-DEXTROSE 2-3 GM-%(50ML) IV SOLR
INTRAVENOUS | Status: DC | PRN
  Administered 2017-10-30: 2 g via INTRAVENOUS

## 2017-10-30 MED ORDER — FENTANYL CITRATE (PF) 100 MCG/2ML IJ SOLN
INTRAMUSCULAR | Status: DC | PRN
  Administered 2017-10-30 (×2): 25 ug via INTRAVENOUS
  Administered 2017-10-30: 50 ug via INTRAVENOUS

## 2017-10-30 MED ORDER — CEFAZOLIN SODIUM-DEXTROSE 2-4 GM/100ML-% IV SOLN
INTRAVENOUS | Status: AC
  Filled 2017-10-30: qty 100

## 2017-10-30 MED ORDER — CEPHALEXIN 500 MG PO CAPS: 500.0000 mg | ORAL_CAPSULE | Freq: Every day | ORAL | 0 refills | Status: DC

## 2017-10-30 MED ORDER — SODIUM CHLORIDE 0.9 % IV SOLN
50.0000 mg | Freq: Once | INTRAVENOUS | Status: AC
  Administered 2017-10-30: 50 mg via INTRAVESICAL
  Filled 2017-10-30: qty 25

## 2017-10-30 MED ORDER — HYDROMORPHONE HCL 1 MG/ML IJ SOLN
0.2500 mg | INTRAMUSCULAR | Status: DC | PRN
  Administered 2017-10-30 (×2): 0.5 mg via INTRAVENOUS

## 2017-10-30 MED ORDER — PHENYLEPHRINE 40 MCG/ML (10ML) SYRINGE FOR IV PUSH (FOR BLOOD PRESSURE SUPPORT)
PREFILLED_SYRINGE | INTRAVENOUS | Status: AC
  Filled 2017-10-30: qty 10

## 2017-10-30 MED ORDER — PROPOFOL 10 MG/ML IV BOLUS
INTRAVENOUS | Status: AC
  Filled 2017-10-30: qty 20

## 2017-10-30 MED ORDER — FENTANYL CITRATE (PF) 100 MCG/2ML IJ SOLN
INTRAMUSCULAR | Status: AC
  Filled 2017-10-30: qty 2

## 2017-10-30 SURGICAL SUPPLY — 12 items
BAG URINE DRAINAGE (UROLOGICAL SUPPLIES) ×1 IMPLANT
BAG URO CATCHER STRL LF (MISCELLANEOUS) ×2 IMPLANT
CATH FOLEY 2WAY SLVR  5CC 18FR (CATHETERS) ×1
CATH FOLEY 2WAY SLVR 5CC 18FR (CATHETERS) IMPLANT
COVER FOOTSWITCH UNIV (MISCELLANEOUS) IMPLANT
COVER SURGICAL LIGHT HANDLE (MISCELLANEOUS) ×2 IMPLANT
GLOVE BIOGEL M STRL SZ7.5 (GLOVE) ×2 IMPLANT
GOWN STRL REUS W/TWL XL LVL3 (GOWN DISPOSABLE) ×2 IMPLANT
LOOP CUT BIPOLAR 24F LRG (ELECTROSURGICAL) ×1 IMPLANT
MANIFOLD NEPTUNE II (INSTRUMENTS) ×2 IMPLANT
PACK CYSTO (CUSTOM PROCEDURE TRAY) ×2 IMPLANT
TUBING CONNECTING 10 (TUBING) ×2 IMPLANT

## 2017-10-30 NOTE — Anesthesia Postprocedure Evaluation (Signed)
Anesthesia Post Note  Patient: Wayne [name]  Procedure(s) Performed: TRANSURETHRAL RESECTION OF BLADDER TUMOR (TURBT)/ INSTILLATION OF EPIRUBICIN (N/A )     Patient location during evaluation: PACU Anesthesia Type: General Level of consciousness: awake and alert Pain management: pain level controlled Vital Signs Assessment: post-procedure vital signs reviewed and stable Respiratory status: spontaneous breathing, nonlabored ventilation, respiratory function stable and patient connected to nasal cannula oxygen Cardiovascular status: blood pressure returned to baseline and stable Postop Assessment: no apparent nausea or vomiting Anesthetic complications: no    Last Vitals:  Vitals:   10/30/17 1049 10/30/17 1122  BP: (!) 116/57 (!) 166/60  Pulse: 63 68  Resp:  18  Temp: 36.7 C 36.4 C  SpO2: 95%     Last Pain:  Vitals:   10/30/17 1122  TempSrc: Oral  PainSc:                  Tylan Briguglio DAVID

## 2017-10-30 NOTE — Anesthesia Preprocedure Evaluation (Addendum)
Anesthesia Evaluation  Patient identified by MRN, date of birth, ID band Patient awake    Reviewed: Allergy & Precautions, NPO status , Patient's Chart, lab work & pertinent test results  Airway Mallampati: II  TM Distance: >3 FB Neck ROM: Full    Dental   Pulmonary sleep apnea , former smoker,    Pulmonary exam normal        Cardiovascular hypertension, Pt. on medications + CAD and + Past MI  Normal cardiovascular exam+ dysrhythmias Ventricular Tachycardia + Cardiac Defibrillator   S/P V Tach Ablation 06/2017 Lovena Le MD   Neuro/Psych    GI/Hepatic GERD  Medicated and Controlled,  Endo/Other  diabetes  Renal/GU Renal InsufficiencyRenal disease     Musculoskeletal   Abdominal   Peds  Hematology   Anesthesia Other Findings   Reproductive/Obstetrics                            Anesthesia Physical Anesthesia Plan  ASA: III  Anesthesia Plan: General   Post-op Pain Management:    Induction: Intravenous  PONV Risk Score and Plan: 2 and Dexamethasone and Ondansetron  Airway Management Planned: LMA  Additional Equipment:   Intra-op Plan:   Post-operative Plan: Extubation in OR  Informed Consent: I have reviewed the patients History and Physical, chart, labs and discussed the procedure including the risks, benefits and alternatives for the proposed anesthesia with the patient or authorized representative who has indicated his/her understanding and acceptance.     Plan Discussed with: CRNA and Surgeon  Anesthesia Plan Comments:         Anesthesia Quick Evaluation

## 2017-10-30 NOTE — Op Note (Signed)
Preoperative diagnosis: Bladder neoplasm uncertain behavior Postoperative diagnosis: Same  Procedure: TURBT 2-5 cm, instillation of epirubicin chemotherapy into bladder in PACU  Surgeon: Junious Silk  Anesthesia: General  Indication for procedure: 76 year old white male with gross hematuria and the posterior bladder tumor.  Findings: On cystoscopy the urethra and prostatic urethra were unremarkable.  No obstructive BPH.  Trigone and ureteral orifice ease appeared normal with clear reflux.  Posterior bladder tumor was some superficial surrounding tumor and erythema.  No other mucosal lesions.  On exam I did not palpate any lymphadenopathy or hard area in the right groin. The penis was circumcised and normal without mass. The scrotum appeared normal. The left and right inguinal regions appeared normal and were palpably normal.   Description of procedure: After consent was obtained the patient brought to the operating room.  After adequate anesthesia he was placed in lithotomy position and prepped and draped in the usual sterile fashion.  A timeout was performed to confirm the patient and procedure.  The cystoscope was passed per urethra and the bladder carefully inspected with a 30 degree and 70 degree lens.  I then used a cold cup biopsy forceps to biopsy the edge of the tumor where there was erythema and some superficial appearing tumor mainly to rule out CIS.  I then took the loop and began to resect the tumor.  The bladder decompressed.  The patient was given a bit more anesthetic and the bladder filled.  There was no perforation.  The resection was not even to the base of the tumor and not as deep as the cold cup biopsy.  Equal return of irrigation ensued.  The tumor was resected and I did a cold cup of the base to get a deeper bite.  The tumor was in the posterior and superior.  There is hemostasis was excellent at low pressure and the bladder was filled and the scope removed.  I then placed an 39  French Foley catheter and left it to gravity drainage.  Drainage was clear.  An exam was performed.  He was awakened and taken to the recovery room in stable condition.  Complications: None  Blood loss: Minimal  Specimens to pathology #1 peripheral biopsy bladder tumor #2 bladder tumor resection #3 biopsy of tumor base  Drains: 18 French Foley  Instillation of epirubicin and PACU: 50 mg of epirubicin was instilled per urethra and the catheter clamped.  It was left to indwell for 50 minutes and then the bladder drained.

## 2017-10-30 NOTE — Anesthesia Procedure Notes (Signed)
Procedure Name: LMA Insertion Date/Time: 10/30/2017 7:41 AM Performed by: [place] Gordie Belvin C, CRNA Pre-anesthesia Checklist: Patient identified, Emergency Drugs available, Suction available and Patient being monitored Patient Re-evaluated:Patient Re-evaluated prior to induction Oxygen Delivery Method: Circle system utilized Preoxygenation: Pre-oxygenation with 100% oxygen Induction Type: IV induction Ventilation: Mask ventilation without difficulty LMA: LMA inserted LMA Size: 5.0 Number of attempts: 1 Airway Equipment and Method: Bite block Placement Confirmation: positive ETCO2 Tube secured with: Tape Dental Injury: Teeth and Oropharynx as per pre-operative assessment

## 2017-10-30 NOTE — H&P (Signed)
H&P  Chief Complaint: bladder tumor  History of Present Illness: 76 yo male with gross hematuria and posterior bladder tumor on office cystoscopy. Upper tracts clear on CT. He's had no further gross hematuria and no dysuria or fever.   Also c/o ED. We briefly discussed management.   Past Medical History:  Diagnosis Date  . Adenocarcinoma of prostate ([place])    s/p seed implants  . Arthritis   . Cellulitis of left leg    a. 05/8126 complicated by septic shock  . Chronic combined systolic and diastolic CHF (congestive heart failure) ([place])   . Colon polyps   . CORONARY ATHEROSCLEROSIS NATIVE CORONARY ARTERY    a. 01/2011 Cath/PCI: LM nl, LAD 40-50p, D1 80-small, LCX 95-small, RI 90, RCA 100, EF 20%;  b. 01/2011 Card MRI - No transmural scar;  c. 01/2011 PCI RCA->5 Promus DES, RI->3.0x16 Promus DES; d. Cath 01/13/13 patent LAD & Ramus, diffuse LCx dz, RCA mult overlapping stents w/ 95% osital stenosis, EF 20%, s/p DES to ostial/prox RCA 01/24/13   . Diabetes mellitus, type 2 ([place])   . GERD (gastroesophageal reflux disease)   . Hematoma of leg    a. left leg hematoma 03/2012 in the setting of asa/effient  . Herpes zoster ophthalmicus   . HYPERLIPIDEMIA    intolerant to Lipitor (myalgias)  . HYPERTENSION   . Ischemic cardiomyopathy    a. 01/2012 Echo EF 45%, mild LVH; b. NT70%, grade 1 diastolic dysfunction, diffuse hypokinesis, inferoposterior akinesis   . Noncompliance   . Obesity   . OSA (obstructive sleep apnea)   . Polymyalgia rheumatica ([facility])    Past Surgical History:  Procedure Laterality Date  . CARDIAC CATHETERIZATION  01/24/2013  . CARDIAC CATHETERIZATION N/A 11/14/2016   Procedure: Right/Left Heart Cath and Coronary Angiography;  Surgeon: Larey Dresser, MD;  Location: [facility];  Service: Cardiovascular;  Laterality: N/A;  . CARDIAC CATHETERIZATION N/A 11/17/2016   Procedure: Coronary Stent Intervention w/Impella;  Surgeon: Peter M Martinique, MD;  Location: [facility];  Service: Cardiovascular;  Laterality: N/A;  . CARDIAC CATHETERIZATION N/A 11/17/2016   Procedure: Coronary Atherectomy;  Surgeon: Peter M Martinique, MD;  Location: [facility];  Service: Cardiovascular;  Laterality: N/A;  . CORONARY ANGIOPLASTY WITH STENT PLACEMENT  01/24/2013   DES to RCA  . CORONARY BALLOON ANGIOPLASTY N/A 07/06/2017   Procedure: Coronary Balloon Angioplasty;  Surgeon: Sherren Mocha, MD;  Location: [facility];  Service: Cardiovascular;  Laterality: N/A;  . CORONARY STENT PLACEMENT  2012   reports 6 stents placed  . I&D EXTREMITY  06/15/2012   Procedure: IRRIGATION AND DEBRIDEMENT EXTREMITY;  Surgeon: Newt Minion, MD;  Location: [facility];  Service: Orthopedics;  Laterality: Left;  I&D Left Posterior Knee  . I&D EXTREMITY  06/30/2012   Procedure: IRRIGATION AND DEBRIDEMENT EXTREMITY;  Surgeon: Newt Minion, MD;  Location: [facility];  Service: Orthopedics;  Laterality: Left;  Left Leg Irrigation and Debridement and placement of Wound VAC and application of  A-cell  . I&D EXTREMITY  07/20/2012   Procedure: IRRIGATION AND DEBRIDEMENT EXTREMITY;  Surgeon: Newt Minion, MD;  Location: [facility];  Service: Orthopedics;  Laterality: Left;  Irrigation and Debridement Left Leg and Place antibiotic beads   . ICD IMPLANT N/A 02/12/2017   Procedure: ICD Implant;  Surgeon: Evans Lance, MD;  Location: [facility];  Service: Cardiovascular;  Laterality: N/A;  . LEAD REVISION/REPAIR N/A 05/26/2017  Procedure: Lead Revision/Repair;  Surgeon: Evans Lance, MD;  Location: [facility];  Service: Cardiovascular;  Laterality: N/A;  . LEFT HEART CATH AND CORONARY ANGIOGRAPHY N/A 07/06/2017   Procedure: Left Heart Cath and Coronary Angiography;  Surgeon: Larey Dresser, MD;  Location: [facility];  Service: Cardiovascular;  Laterality: N/A;  . PERCUTANEOUS CORONARY STENT INTERVENTION (PCI-S) N/A 01/24/2013   Procedure: PERCUTANEOUS CORONARY STENT INTERVENTION (PCI-S);   Surgeon: Wellington Hampshire, MD;  Location: [facility];  Service: Cardiovascular;  Laterality: N/A;  . PROSTATE SURGERY     cancer, seed implant  . ULTRASOUND GUIDANCE FOR VASCULAR ACCESS  11/14/2016   Procedure: Ultrasound Guidance For Vascular Access;  Surgeon: Larey Dresser, MD;  Location: [facility];  Service: Cardiovascular;;  . Stephanie Coup ABLATION N/A 06/19/2017   Procedure: Stephanie Coup Ablation;  Surgeon: Evans Lance, MD;  Location: [facility];  Service: Cardiovascular;  Laterality: N/A;    Home Medications:  Facility-Administered Medications Prior to Admission  Medication Dose Route Frequency Provider Last Rate Last Dose  . ciprofloxacin (CIPRO) IVPB 400 mg  400 mg Intravenous Once Festus Aloe, MD       Medications Prior to Admission  Medication Sig Dispense Refill Last Dose  . amiodarone (PACERONE) 200 MG tablet Take 200 mg by mouth daily.   10/29/2017 at Unknown time  . carvedilol (COREG) 12.5 MG tablet Take 1 tablet (12.5 mg total) by mouth 2 (two) times daily. 60 tablet 6 10/29/2017 at Unknown time  . DIGOX 125 MCG tablet TAKE 1/2 TABLET(0.063 MG) BY MOUTH DAILY 15 tablet 3 10/29/2017 at Unknown time  . diphenhydramine-acetaminophen (TYLENOL PM) 25-500 MG TABS tablet Take 1 tablet at bedtime as needed by mouth (for sleep).    Taking  . Evolocumab (REPATHA SURECLICK) 742 MG/ML SOAJ Inject 140 mg every 14 (fourteen) days into the skin.    Taking  . furosemide (LASIX) 20 MG tablet Take 2 tablets (40 mg total) by mouth daily. (Patient taking differently: Take 20 mg 2 (two) times daily by mouth. ) 30 tablet 6 10/29/2017 at Unknown time  . glimepiride (AMARYL) 2 MG tablet TAKE 1 TABLET(2 MG) BY MOUTH DAILY BEFORE BREAKFAST 90 tablet 0 10/29/2017 at Unknown time  . losartan (COZAAR) 25 MG tablet Take 12.5 mg by mouth every evening.   10/29/2017 at Unknown time  . Multiple Vitamins-Minerals (CENTRUM SILVER ADULT 50+) TABS Take 1 tablet by mouth daily.   Past Month at  Unknown time  . Omega-3 Fatty Acids (FISH OIL) 1000 MG CAPS Take 1,000 mg by mouth 2 (two) times daily.    Past Month at Unknown time  . pravastatin (PRAVACHOL) 20 MG tablet Take 1 tablet (20 mg total) by mouth every evening. 30 tablet 5 Taking  . rivaroxaban (XARELTO) 20 MG TABS tablet Take 1 tablet (20 mg total) by mouth daily with supper. (Patient not taking: Reported on 10/27/2017) 30 tablet 3 10/27/2017  . spironolactone (ALDACTONE) 25 MG tablet Take 0.5 tablets (12.5 mg total) by mouth every evening. 15 tablet 6 10/29/2017 at Unknown time  . traMADol (ULTRAM) 50 MG tablet Take 1 tablet (50 mg total) by mouth every 8 (eight) hours as needed for moderate pain. (Patient not taking: Reported on 10/19/2017) 20 tablet 0 Completed Course at Unknown time   Allergies:  Allergies  Allergen Reactions  . Crestor [Rosuvastatin Calcium] Other (See Comments)    Myalgia, Interfering with Gait  . Entresto [Sacubitril-Valsartan] Swelling  and Other (See Comments)    Angioedema  . Plavix [Clopidogrel] Itching, Swelling and Other (See Comments)    Nose bleeds and welts on legs & back  . Lipitor [Atorvastatin] Other (See Comments)    Makes legs sore  . Ancef [Cefazolin] Itching, Rash and Other (See Comments)    Describes itching and rash, but said that "it wasn't that bad"    Family History  Problem Relation Age of Onset  . Cancer Mother 63       unknown CA  . Heart disease Father   . Alcohol abuse Father   . Heart attack Father 56   Social History:  reports that he quit smoking about 28 years ago. His smoking use included cigarettes. He has a 6.00 pack-year smoking history. he has never used smokeless tobacco. He reports that he does not drink alcohol or use drugs.  ROS: A complete review of systems was performed.  All systems are negative except for pertinent findings as noted. ROS   Physical Exam:  Vital signs in last 24 hours: Weight:  [104.8 kg (231 lb)] 104.8 kg (231 lb) (11/16  0622) General:  Alert and oriented, No acute distress HEENT: Normocephalic, atraumatic Neck: No JVD or lymphadenopathy Cardiovascular: Regular rate and rhythm Lungs: Regular rate and effort Abdomen: Soft, nontender, nondistended, no abdominal masses Back: No CVA tenderness Extremities: No edema Neurologic: Grossly intact  Laboratory Data:  Results for orders placed or performed during the hospital encounter of 10/30/17 (from the past 24 hour(s))  Glucose, capillary     Status: Abnormal   Collection Time: 10/30/17  5:56 AM  Result Value Ref Range   Glucose-Capillary 281 (H) 65 - 99 mg/dL   No results found for this or any previous visit (from the past 240 hour(s)). Creatinine: Recent Labs    10/27/17 0947  CREATININE 1.42*    Impression/Assessment/plan: I discussed with the patient the nature, potential benefits, risks and alternatives to TURBT with epirubicin, including side effects of the proposed treatment, the likelihood of the patient achieving the goals of the procedure, and any potential problems that might occur during the procedure or recuperation. All questions answered. Patient elects to proceed. Discussed he may need prolonged catheterization. Discussed cefazolin allergy - he only recalls some mild itching and rash. No trouble breathing or other issue. We discussed we will give cefazolin and monitor closely.     Festus Aloe 10/30/2017, 7:25 AM

## 2017-10-30 NOTE — Transfer of Care (Signed)
Immediate Anesthesia Transfer of Care Note  Patient: Wayne [name]  Procedure(s) Performed: TRANSURETHRAL RESECTION OF BLADDER TUMOR (TURBT)/ INSTILLATION OF EPIRUBICIN (N/A )  Patient Location: PACU  Anesthesia Type:General  Level of Consciousness: awake, alert  and oriented  Airway & Oxygen Therapy: Patient Spontanous Breathing and Patient connected to face mask oxygen  Post-op Assessment: Report given to RN and Post -op Vital signs reviewed and stable  Post vital signs: Reviewed and stable  Last Vitals: There were no vitals filed for this visit.  Last Pain: There were no vitals filed for this visit.    Patients Stated Pain Goal: 3 (34/91/79 1505)  Complications: No apparent anesthesia complications

## 2017-10-30 NOTE — Discharge Instructions (Signed)
General Anesthesia, Adult, Care After These instructions provide you with information about caring for yourself after your procedure. Your health care provider may also give you more specific instructions. Your treatment has been planned according to current medical practices, but problems sometimes occur. Call your health care provider if you have any problems or questions after your procedure. What can I expect after the procedure? After the procedure, it is common to have:  Vomiting.  A sore throat.  Mental slowness.  It is common to feel:  Nauseous.  Cold or shivery.  Sleepy.  Tired.  Sore or achy, even in parts of your body where you did not have surgery.  Follow these instructions at home: For at least 24 hours after the procedure:  Do not: ? Participate in activities where you could fall or become injured. ? Drive. ? Use heavy machinery. ? Drink alcohol. ? Take sleeping pills or medicines that cause drowsiness. ? Make important decisions or sign legal documents. ? Take care of children on your own.  Rest. Eating and drinking  If you vomit, drink water, juice, or soup when you can drink without vomiting.  Drink enough fluid to keep your urine clear or pale yellow.  Make sure you have little or no nausea before eating solid foods.  Follow the diet recommended by your health care provider. General instructions  Have a responsible adult stay with you until you are awake and alert.  Return to your normal activities as told by your health care provider. Ask your health care provider what activities are safe for you.  Take over-the-counter and prescription medicines only as told by your health care provider.  If you smoke, do not smoke without supervision.  Keep all follow-up visits as told by your health care provider. This is important. Contact a health care provider if:  You continue to have nausea or vomiting at home, and medicines are not helpful.  You  cannot drink fluids or start eating again.  You cannot urinate after 8-12 hours.  You develop a skin rash.  You have fever.  You have increasing redness at the site of your procedure. Get help right away if:  You have difficulty breathing.  You have chest pain.  You have unexpected bleeding.  You feel that you are having a life-threatening or urgent problem. This information is not intended to replace advice given to you by your health care provider. Make sure you discuss any questions you have with your health care provider. Document Released: 03/09/2001 Document Revised: 05/05/2016 Document Reviewed: 11/15/2015 Elsevier Interactive Patient Education  2018 [place]. Indwelling Urinary Catheter Care, Adult  Remove the foley Monday morning as instructed or call the office for appointment for removal.   Take good care of your catheter to keep it working and to prevent problems. How to wear your catheter Attach your catheter to your leg with tape (adhesive tape) or a leg strap. Make sure it is not too tight. If you use tape, remove any bits of tape that are already on the catheter. How to wear a drainage bag You should have:  A large overnight bag.  A small leg bag.  Overnight Bag You may wear the overnight bag at any time. Always keep the bag below the level of your bladder but off the floor. When you sleep, put a clean plastic bag in a wastebasket. Then hang the bag inside the wastebasket. Leg Bag Never wear the leg bag at night. Always wear the leg  bag below your knee. Keep the leg bag secure with a leg strap or tape. How to care for your skin  Clean the skin around the catheter at least once every day.  Shower every day. Do not take baths.  Put creams, lotions, or ointments on your genital area only as told by your doctor.  Do not use powders, sprays, or lotions on your genital area. How to clean your catheter and your skin 1. Wash your hands with soap and  water. 2. Wet a washcloth in warm water and gentle (mild) soap. 3. Use the washcloth to clean the skin where the catheter enters your body. Clean downward and wipe away from the catheter in small circles. Do not wipe toward the catheter. 4. Pat the area dry with a clean towel. Make sure to clean off all soap. How to care for your drainage bags Empty your drainage bag when it is ?- full or at least 2-3 times a day. Replace your drainage bag once a month or sooner if it starts to smell bad or look dirty. Do not clean your drainage bag unless told by your doctor. Emptying a drainage bag  Supplies Needed  Rubbing alcohol.  Gauze pad or cotton ball.  Tape or a leg strap.  Steps 1. Wash your hands with soap and water. 2. Separate (detach) the bag from your leg. 3. Hold the bag over the toilet or a clean container. Keep the bag below your hips and bladder. This stops pee (urine) from going back into the tube. 4. Open the pour spout at the bottom of the bag. 5. Empty the pee into the toilet or container. Do not let the pour spout touch any surface. 6. Put rubbing alcohol on a gauze pad or cotton ball. 7. Use the gauze pad or cotton ball to clean the pour spout. 8. Close the pour spout. 9. Attach the bag to your leg with tape or a leg strap. 10. Wash your hands.  Changing a drainage bag Supplies Needed  Alcohol wipes.  A clean drainage bag.  Adhesive tape or a leg strap.  Steps 1. Wash your hands with soap and water. 2. Separate the dirty bag from your leg. 3. Pinch the rubber catheter with your fingers so that pee does not spill out. 4. Separate the catheter tube from the drainage tube where these tubes connect (at the connection valve). Do not let the tubes touch any surface. 5. Clean the end of the catheter tube with an alcohol wipe. Use a different alcohol wipe to clean the end of the drainage tube. 6. Connect the catheter tube to the drainage tube of the clean bag. 7. Attach  the new bag to the leg with adhesive tape or a leg strap. 8. Wash your hands.  How to prevent infection and other problems  Never pull on your catheter or try to remove it. Pulling can damage tissue in your body.  Always wash your hands before and after touching your catheter.  If a leg strap gets wet, replace it with a dry one.  Drink enough fluids to keep your pee clear or pale yellow, or as told by your doctor.  Do not let the drainage bag or tubing touch the floor.  Wear cotton underwear.  If you are male, wipe from front to back after you poop (have a bowel movement).  Check on the catheter often to make sure it works and the tubing is not twisted. Get help if:  Your pee is cloudy.  Your pee smells unusually bad.  Your pee is not draining into the bag.  Your tube gets clogged.  Your catheter starts to leak.  Your bladder feels full. Get help right away if:  You have redness, swelling, or pain where the catheter enters your body.  You have fluid, pus, or a bad smell coming from the area where the catheter enters your body.  The area where the catheter enters your body feels warm.  You have a fever.  You have pain in your: ? Stomach (abdomen). ? Legs. ? Lower back. ? Bladder.  You see blood fill the catheter.  Your pee is pink or red.  You feel sick to your stomach (nauseous).  You throw up (vomit).  You have chills.  Your catheter gets pulled out. This information is not intended to replace advice given to you by your health care provider. Make sure you discuss any questions you have with your health care provider. Document Released: 03/28/2013 Document Revised: 10/29/2016 Document Reviewed: 05/16/2014 Elsevier Interactive Patient Education  2018 [employer].   Cystoscopy, Care After Refer to this sheet in the next few weeks. These instructions provide you with information about caring for yourself after your procedure. Your health care  provider may also give you more specific instructions. Your treatment has been planned according to current medical practices, but problems sometimes occur. Call your health care provider if you have any problems or questions after your procedure. What can I expect after the procedure? After the procedure, it is common to have:  Mild pain when you urinate. Pain should stop within a few minutes after you urinate. This may last for up to 1 week.  A small amount of blood in your urine for several days.  Feeling like you need to urinate but producing only a small amount of urine.  Follow these instructions at home:  Medicines  Take over-the-counter and prescription medicines only as told by your health care provider.  If you were prescribed an antibiotic medicine, take it as told by your health care provider. Do not stop taking the antibiotic even if you start to feel better. General instructions   Return to your normal activities as told by your health care provider. Ask your health care provider what activities are safe for you.  Do not drive for 24 hours if you received a sedative.  Watch for any blood in your urine. If the amount of blood in your urine increases, call your health care provider.  Follow instructions from your health care provider about eating or drinking restrictions.  If a tissue sample was removed for testing (biopsy) during your procedure, it is your responsibility to get your test results. Ask your health care provider or the department performing the test when your results will be ready.  Drink enough fluid to keep your urine clear or pale yellow.  Keep all follow-up visits as told by your health care provider. This is important. Contact a health care provider if:  You have pain that gets worse or does not get better with medicine, especially pain when you urinate.  You have difficulty urinating. Get help right away if:  You have more blood in your  urine.  You have blood clots in your urine.  You have abdominal pain.  You have a fever or chills.  You are unable to urinate. This information is not intended to replace advice given to you by your health care provider. Make  sure you discuss any questions you have with your health care provider. Document Released: 06/20/2005 Document Revised: 05/08/2016 Document Reviewed: 10/18/2015 Elsevier Interactive Patient Education  2017 [employer].

## 2017-10-31 ENCOUNTER — Encounter (HOSPITAL_COMMUNITY): Payer: Self-pay | Admitting: Urology

## 2017-11-09 ENCOUNTER — Ambulatory Visit (INDEPENDENT_AMBULATORY_CARE_PROVIDER_SITE_OTHER): Payer: Medicare Other | Admitting: Family Medicine

## 2017-11-09 ENCOUNTER — Encounter: Payer: Self-pay | Admitting: Family Medicine

## 2017-11-09 VITALS — BP 110/60 | HR 98 | Temp 98.3°F | Wt 231.0 lb

## 2017-11-09 DIAGNOSIS — I10 Essential (primary) hypertension: Secondary | ICD-10-CM

## 2017-11-09 DIAGNOSIS — C679 Malignant neoplasm of bladder, unspecified: Secondary | ICD-10-CM | POA: Insufficient documentation

## 2017-11-09 DIAGNOSIS — I5022 Chronic systolic (congestive) heart failure: Secondary | ICD-10-CM

## 2017-11-09 DIAGNOSIS — I255 Ischemic cardiomyopathy: Secondary | ICD-10-CM | POA: Diagnosis not present

## 2017-11-09 DIAGNOSIS — E11649 Type 2 diabetes mellitus with hypoglycemia without coma: Secondary | ICD-10-CM

## 2017-11-09 DIAGNOSIS — Z23 Encounter for immunization: Secondary | ICD-10-CM | POA: Diagnosis not present

## 2017-11-09 LAB — POCT GLYCOSYLATED HEMOGLOBIN (HGB A1C): Hemoglobin A1C: 11.3

## 2017-11-09 MED ORDER — GLIMEPIRIDE 4 MG PO TABS: 4.0000 mg | ORAL_TABLET | Freq: Every day | ORAL | 5 refills | Status: DC

## 2017-11-09 NOTE — Progress Notes (Signed)
Subjective:     Patient ID: Wayne [name], male   DOB: [birth year], 76 y.o.   MRN: [id number]  HPI Patient has multiple chronic problems and has not been seen here for any regular follow-up. Has multiple chronic problems including history of CAD, systolic heart failure, ventricular tachycardia, atrial fibrillation, hypertension, obstructive sleep apnea, type 2 diabetes with history of poor compliance and poor control, chronic kidney disease stage III, prostate cancer, hyperlipidemia, and recent diagnosis of bladder cancer  He had recent A1c of 10.3 during hospitalization and repeat today's 11.3. He is accompanied by his wife. His wife states he has horrible dietary compliance. Is currently taking only Amaryl 2 mg once daily. He is not a candidate for several oral type 2 diabetes medications because of his chronic kidney disease and also because of his severe systolic heart failure. He has been very reluctant to start insulin because of concern for cost issues.  Wife states that he eats lots of high glucose substances including fruit juice, ice cream, high starch snacks, frequent milk consumption, etc.  He's had previous diabetic teaching and declines any further teaching at this time.  Past Medical History:  Diagnosis Date  . Adenocarcinoma of prostate ([facility])    s/p seed implants  . Arthritis   . Cellulitis of left leg    a. 01/9517 complicated by septic shock  . Chronic combined systolic and diastolic CHF (congestive heart failure) ([place])   . Colon polyps   . CORONARY ATHEROSCLEROSIS NATIVE CORONARY ARTERY    a. 01/2011 Cath/PCI: LM nl, LAD 40-50p, D1 80-small, LCX 95-small, RI 90, RCA 100, EF 20%;  b. 01/2011 Card MRI - No transmural scar;  c. 01/2011 PCI RCA->5 Promus DES, RI->3.0x16 Promus DES; d. Cath 01/13/13 patent LAD & Ramus, diffuse LCx dz, RCA mult overlapping stents w/ 95% osital stenosis, EF 20%, s/p DES to ostial/prox RCA 01/24/13   . Diabetes mellitus, type 2 ([place])   . GERD  (gastroesophageal reflux disease)   . Hematoma of leg    a. left leg hematoma 03/2012 in the setting of asa/effient  . Herpes zoster ophthalmicus   . HYPERLIPIDEMIA    intolerant to Lipitor (myalgias)  . HYPERTENSION   . Ischemic cardiomyopathy    a. 01/2012 Echo EF 45%, mild LVH; b. AC16%, grade 1 diastolic dysfunction, diffuse hypokinesis, inferoposterior akinesis   . Noncompliance   . Obesity   . OSA (obstructive sleep apnea)   . Polymyalgia rheumatica ([facility])    Past Surgical History:  Procedure Laterality Date  . CARDIAC CATHETERIZATION  01/24/2013  . CARDIAC CATHETERIZATION N/A 11/14/2016   Procedure: Right/Left Heart Cath and Coronary Angiography;  Surgeon: Larey Dresser, MD;  Location: [facility];  Service: Cardiovascular;  Laterality: N/A;  . CARDIAC CATHETERIZATION N/A 11/17/2016   Procedure: Coronary Stent Intervention w/Impella;  Surgeon: Peter M Martinique, MD;  Location: [facility];  Service: Cardiovascular;  Laterality: N/A;  . CARDIAC CATHETERIZATION N/A 11/17/2016   Procedure: Coronary Atherectomy;  Surgeon: Peter M Martinique, MD;  Location: [facility];  Service: Cardiovascular;  Laterality: N/A;  . CORONARY ANGIOPLASTY WITH STENT PLACEMENT  01/24/2013   DES to RCA  . CORONARY BALLOON ANGIOPLASTY N/A 07/06/2017   Procedure: Coronary Balloon Angioplasty;  Surgeon: Sherren Mocha, MD;  Location: [facility];  Service: Cardiovascular;  Laterality: N/A;  . CORONARY STENT PLACEMENT  2012   reports 6 stents placed  . I&D EXTREMITY  06/15/2012   Procedure: IRRIGATION  AND DEBRIDEMENT EXTREMITY;  Surgeon: Newt Minion, MD;  Location: [facility];  Service: Orthopedics;  Laterality: Left;  I&D Left Posterior Knee  . I&D EXTREMITY  06/30/2012   Procedure: IRRIGATION AND DEBRIDEMENT EXTREMITY;  Surgeon: Newt Minion, MD;  Location: [facility];  Service: Orthopedics;  Laterality: Left;  Left Leg Irrigation and Debridement and placement of Wound VAC and application of   A-cell  . I&D EXTREMITY  07/20/2012   Procedure: IRRIGATION AND DEBRIDEMENT EXTREMITY;  Surgeon: Newt Minion, MD;  Location: [facility];  Service: Orthopedics;  Laterality: Left;  Irrigation and Debridement Left Leg and Place antibiotic beads   . ICD IMPLANT N/A 02/12/2017   Procedure: ICD Implant;  Surgeon: Evans Lance, MD;  Location: [facility];  Service: Cardiovascular;  Laterality: N/A;  . LEAD REVISION/REPAIR N/A 05/26/2017   Procedure: Lead Revision/Repair;  Surgeon: Evans Lance, MD;  Location: [facility];  Service: Cardiovascular;  Laterality: N/A;  . LEFT HEART CATH AND CORONARY ANGIOGRAPHY N/A 07/06/2017   Procedure: Left Heart Cath and Coronary Angiography;  Surgeon: Larey Dresser, MD;  Location: [facility];  Service: Cardiovascular;  Laterality: N/A;  . PERCUTANEOUS CORONARY STENT INTERVENTION (PCI-S) N/A 01/24/2013   Procedure: PERCUTANEOUS CORONARY STENT INTERVENTION (PCI-S);  Surgeon: Wellington Hampshire, MD;  Location: [facility];  Service: Cardiovascular;  Laterality: N/A;  . PROSTATE SURGERY     cancer, seed implant  . TRANSURETHRAL RESECTION OF BLADDER TUMOR N/A 10/30/2017   Procedure: TRANSURETHRAL RESECTION OF BLADDER TUMOR (TURBT)/ INSTILLATION OF EPIRUBICIN;  Surgeon: Festus Aloe, MD;  Location: [facility];  Service: Urology;  Laterality: N/A;  . ULTRASOUND GUIDANCE FOR VASCULAR ACCESS  11/14/2016   Procedure: Ultrasound Guidance For Vascular Access;  Surgeon: Larey Dresser, MD;  Location: [facility];  Service: Cardiovascular;;  . Stephanie Coup ABLATION N/A 06/19/2017   Procedure: Stephanie Coup Ablation;  Surgeon: Evans Lance, MD;  Location: [facility];  Service: Cardiovascular;  Laterality: N/A;    reports that he quit smoking about 28 years ago. His smoking use included cigarettes. He has a 6.00 pack-year smoking history. he has never used smokeless tobacco. He reports that he does not drink alcohol or use drugs. family history includes  Alcohol abuse in his father; Cancer (age of onset: 47) in his mother; Heart attack (age of onset: 98) in his father; Heart disease in his father. Allergies  Allergen Reactions  . Crestor [Rosuvastatin Calcium] Other (See Comments)    Myalgia, Interfering with Gait  . Entresto [Sacubitril-Valsartan] Swelling and Other (See Comments)    Angioedema  . Plavix [Clopidogrel] Itching, Swelling and Other (See Comments)    Nose bleeds and welts on legs & back  . Lipitor [Atorvastatin] Other (See Comments)    Makes legs sore  . Ancef [Cefazolin] Itching, Rash and Other (See Comments)    Describes itching and rash, but said that "it wasn't that bad"     Review of Systems  Constitutional: Negative for appetite change, fever and unexpected weight change.  Eyes: Negative for visual disturbance.  Respiratory: Negative for cough and chest tightness.   Cardiovascular: Negative for chest pain, palpitations and leg swelling.  Gastrointestinal: Negative for abdominal pain.  Endocrine: Negative for polydipsia and polyuria.  Genitourinary: Negative for dysuria.  Neurological: Negative for dizziness, syncope, weakness, light-headedness and headaches.       Objective:   Physical Exam  Constitutional: He is oriented to person,  place, and time. He appears well-developed and well-nourished.  HENT:  Mouth/Throat: Oropharynx is clear and moist.  Neck: Neck supple. No thyromegaly present.  Cardiovascular: Normal rate.  Pulmonary/Chest: Effort normal and breath sounds normal. No respiratory distress. He has no wheezes. He has no rales.  Neurological: He is alert and oriented to person, place, and time.       Assessment:     #1 type 2 diabetes very poorly controlled with history of very poor compliance  #2 history of CAD with systolic heart failure followed closely by cardiology  #3 hypertension stable     Plan:     -And long discussion with patient regarding options for further management of  diabetes. He is not a candidate for several classes because of his kidney issues and history of severe heart failure. -Likely need insulin to help manage better control his diabetes. He is very reluctant. We've reluctantly agreed to increase his Amaryl to 4 mg daily but do not think this will likely get him to goal without serious changes in diet and we discussed this with him. Not clear he is motivated to make serious changes that will be necessary. Reasonable goal for control would be A1c 7-8 because of his multiple co-morbidities -Flu vaccine given -Follow-up in 3 months to reassess  [name] MD [facility] at [facility]

## 2017-11-11 ENCOUNTER — Telehealth: Payer: Self-pay | Admitting: *Deleted

## 2017-11-11 NOTE — Telephone Encounter (Signed)
Research Encounter  Contacted subject by phone on Wednesday, November 28th.  Updated subject on BeAT-HF enrollment and study results.  Official letter will be mailed to subjects home address.  

## 2017-11-16 ENCOUNTER — Ambulatory Visit (HOSPITAL_COMMUNITY)
Admission: RE | Admit: 2017-11-16 | Discharge: 2017-11-16 | Disposition: A | Discharge status: Home / Self Care | Payer: Medicare Other | Source: Ambulatory Visit | Attending: Cardiology | Admitting: Cardiology

## 2017-11-16 ENCOUNTER — Encounter (HOSPITAL_COMMUNITY): Payer: Self-pay | Admitting: Cardiology

## 2017-11-16 ENCOUNTER — Telehealth (INDEPENDENT_AMBULATORY_CARE_PROVIDER_SITE_OTHER): Payer: Self-pay | Admitting: Physical Medicine and Rehabilitation

## 2017-11-16 VITALS — BP 122/51 | HR 86 | Wt 233.4 lb

## 2017-11-16 DIAGNOSIS — D414 Neoplasm of uncertain behavior of bladder: Secondary | ICD-10-CM | POA: Diagnosis not present

## 2017-11-16 DIAGNOSIS — Z95810 Presence of automatic (implantable) cardiac defibrillator: Secondary | ICD-10-CM | POA: Insufficient documentation

## 2017-11-16 DIAGNOSIS — M1990 Unspecified osteoarthritis, unspecified site: Secondary | ICD-10-CM | POA: Insufficient documentation

## 2017-11-16 DIAGNOSIS — I4901 Ventricular fibrillation: Secondary | ICD-10-CM | POA: Diagnosis not present

## 2017-11-16 DIAGNOSIS — I2510 Atherosclerotic heart disease of native coronary artery without angina pectoris: Secondary | ICD-10-CM | POA: Diagnosis not present

## 2017-11-16 DIAGNOSIS — M353 Polymyalgia rheumatica: Secondary | ICD-10-CM | POA: Insufficient documentation

## 2017-11-16 DIAGNOSIS — I5022 Chronic systolic (congestive) heart failure: Secondary | ICD-10-CM | POA: Diagnosis not present

## 2017-11-16 DIAGNOSIS — Z7984 Long term (current) use of oral hypoglycemic drugs: Secondary | ICD-10-CM | POA: Insufficient documentation

## 2017-11-16 DIAGNOSIS — E875 Hyperkalemia: Secondary | ICD-10-CM | POA: Insufficient documentation

## 2017-11-16 DIAGNOSIS — I462 Cardiac arrest due to underlying cardiac condition: Secondary | ICD-10-CM | POA: Diagnosis not present

## 2017-11-16 DIAGNOSIS — N183 Chronic kidney disease, stage 3 unspecified: Secondary | ICD-10-CM

## 2017-11-16 DIAGNOSIS — I2582 Chronic total occlusion of coronary artery: Secondary | ICD-10-CM | POA: Diagnosis not present

## 2017-11-16 DIAGNOSIS — M7989 Other specified soft tissue disorders: Secondary | ICD-10-CM

## 2017-11-16 DIAGNOSIS — G4733 Obstructive sleep apnea (adult) (pediatric): Secondary | ICD-10-CM | POA: Diagnosis not present

## 2017-11-16 DIAGNOSIS — I255 Ischemic cardiomyopathy: Secondary | ICD-10-CM | POA: Diagnosis not present

## 2017-11-16 DIAGNOSIS — E1122 Type 2 diabetes mellitus with diabetic chronic kidney disease: Secondary | ICD-10-CM | POA: Insufficient documentation

## 2017-11-16 DIAGNOSIS — E0590 Thyrotoxicosis, unspecified without thyrotoxic crisis or storm: Secondary | ICD-10-CM

## 2017-11-16 DIAGNOSIS — R3915 Urgency of urination: Secondary | ICD-10-CM | POA: Diagnosis not present

## 2017-11-16 DIAGNOSIS — I350 Nonrheumatic aortic (valve) stenosis: Secondary | ICD-10-CM | POA: Insufficient documentation

## 2017-11-16 DIAGNOSIS — R310 Gross hematuria: Secondary | ICD-10-CM | POA: Diagnosis not present

## 2017-11-16 DIAGNOSIS — K219 Gastro-esophageal reflux disease without esophagitis: Secondary | ICD-10-CM | POA: Diagnosis not present

## 2017-11-16 DIAGNOSIS — E669 Obesity, unspecified: Secondary | ICD-10-CM | POA: Insufficient documentation

## 2017-11-16 DIAGNOSIS — I480 Paroxysmal atrial fibrillation: Secondary | ICD-10-CM | POA: Diagnosis not present

## 2017-11-16 DIAGNOSIS — I130 Hypertensive heart and chronic kidney disease with heart failure and stage 1 through stage 4 chronic kidney disease, or unspecified chronic kidney disease: Secondary | ICD-10-CM | POA: Diagnosis not present

## 2017-11-16 DIAGNOSIS — Z79899 Other long term (current) drug therapy: Secondary | ICD-10-CM | POA: Diagnosis not present

## 2017-11-16 DIAGNOSIS — Z7901 Long term (current) use of anticoagulants: Secondary | ICD-10-CM | POA: Insufficient documentation

## 2017-11-16 LAB — COMPREHENSIVE METABOLIC PANEL
ALBUMIN: 3.5 g/dL (ref 3.5–5.0)
ALT: 27 U/L (ref 17–63)
ANION GAP: 8 (ref 5–15)
AST: 33 U/L (ref 15–41)
Alkaline Phosphatase: 83 U/L (ref 38–126)
BUN: 20 mg/dL (ref 6–20)
CHLORIDE: 104 mmol/L (ref 101–111)
CO2: 24 mmol/L (ref 22–32)
Calcium: 9 mg/dL (ref 8.9–10.3)
Creatinine, Ser: 1.27 mg/dL — ABNORMAL HIGH (ref 0.61–1.24)
GFR calc Af Amer: >60 mL/min (ref >60)
GFR calc non Af Amer: 54 mL/min — ABNORMAL LOW (ref >60)
Glucose, Bld: 343 mg/dL — ABNORMAL HIGH (ref 65–99)
Potassium: 5 mmol/L (ref 3.5–5.1)
SODIUM: 136 mmol/L (ref 135–145)
Total Bilirubin: 0.9 mg/dL (ref 0.3–1.2)
Total Protein: 6.3 g/dL — ABNORMAL LOW (ref 6.5–8.1)

## 2017-11-16 LAB — DIGOXIN LEVEL: Digoxin Level: 0.6 ng/mL — ABNORMAL LOW (ref 0.8–2.0)

## 2017-11-16 LAB — TSH: TSH: 1.294 u[IU]/mL (ref 0.350–4.500)

## 2017-11-16 MED ORDER — FUROSEMIDE 20 MG PO TABS: 20.0000 mg | ORAL_TABLET | Freq: Every day | ORAL | 6 refills | Status: DC

## 2017-11-16 NOTE — Progress Notes (Signed)
Patient ID: Wayne [name], male   DOB: [birth year], 76 y.o.   MRN: [id number]   Advanced Heart Failure Clinic Note   Patient ID: Wayne [name], male   DOB: [birth year], 76 y.o.   MRN: [id number] PCP: Dr. Elease Hashimoto Cardiology: Dr. Earline Mayotte Wayne [name] is a 76 y.o. male  with history of DM, HTN, CAD, and ischemic cardiomyopathy who presents for followup of CHF and CAD. Patient had a CHF exacerbation in 2/12 and was found to have LV systolic dysfunction with EF around 20%. LHC showed RCA, ramus, and CFX disease. RCA was subtotally occluded. Cardiac MRI showed that all walls, including the inferior wall, should be viable. Patient therefore underwent opening of his chronic totally occluded RCA as well as PCI to the ramus in 2/12. He received drug eluting stents and was on Effient.  He had an echo in 2/13 that showed EF improved to 45% with moderate LV dilation and mild LV hypertrophy.   In 5/13, he developed a large left lower leg hematoma.  He was still on Effient at that time.  ASA and Effient were stopped.  The hematoma did not resolve.  In 7/13, he was re-admitted with septic shock from MSSA from an abscess in his left gastrocnemius.  He also grew Pseudomonas from the left gastrocnemius as well.  He had a prolonged course in the hospital and later in a rehab facility.  Ultimately, he got back home again.  I had him get an echo in 1/14, and this showed EF 15% with diffuse hypokinesis and inferoposterior akinesis.  He had been off of most of his prior cardiac medications.  Took him back for [facility] in 1/14.  This showed subtotal occlusion of a small AV LCx and 95% ostial in-stent restenosis in the RCA.  He was treated in 2/14 with a Xience DES to the ostial RCA and begun on Plavix.  Unfortunately, he developed diffuse hives after starting Plavix and had to be switched to ticlopidine.  He has tolerated ticlopidine.   Echo done in 12/14 showed some improvement in LV function but EF was still low at 30-35%.  He did not want  ICD.    Presented to ED 11/14/15 with worsening SOB after a few steps and CXR with CHF and bilateral effusions in the setting of marked medical non-compliance. States he stopped taking his medicines in 01/2015 (except for ASA 81 and a multivitamin).  He was feeling good and decided that he did not need them anymore.  Also c/o URI symptoms. Diuresed over 2 L with IV diuretics in the ER and started on lasix 20 mg daily.   Echo (12/16) showed EF 20-25% with diffuse hypokinesis (down from 35% in 12/15).  Echo 10/17 showed EF 15% with mildly decreased RV systolic function.    He had left and right heart catheterization in 12/17. This showed elevated right and left heart filling pressures and low cardiac output. The RCA was totally occluded with collaterals, there was 95% ostial to mid LAD stenosis.  Patient had PCI with DES to proximal-mid LAD.    He was noted to be in atrial fibrillation with RVR in 2/18 at cardiac rehab. He felt fatigued.  He was admitted and started on amiodarone for rate control. ASA was stopped, ticagrelor was decreased to 60 mg bid, and Xarelto 15 mg daily was started.  He converted spontaneously back to NSR.    He suffered a ventricular fibrillation arrest in 3/18.  Luckily, he was wearing  a Lifevest and was shocked.  He was admitted and started on amiodarone.  Medtronic ICD was placed.  Echo 3/18 with EF 20-25%.   Admitted again 6/18 due to VF arrest. ICD lead had been undersensing leading to prolongation of time to discharge.  He underwent RV lead extraction with new lead implant.   He was admitted again with VT storm in 7/18.  No prior chest pain or symptoms of worsening CAD.  He had VT ablation this admission.   Admitted with ICD shock 07/05/17. He had coronary angiography this admission.  Had cutting balloon angioplasty of severe in-stent restenosis in the proximal LAD. Pt had additional VT 07/07/17, EP saw and recommended BB titration. Po amio up-titrated. Discharge weight 228  lbs  He had TURBT 11/18, urothelial cancer diagnosed.  Since procedure, he has had urinary incontinence and urgency. Because of this, he stopped his Lasix. Weight is up 3 lbs.  No dyspnea walking on flat ground but he is limited by hip, knee, and leg pain.  He has pain with walking in calves, hips, knees.  No chest pain.  No orthopnea/PND.  No BRBRP/melena.    Medtronic device interrogated:  No VT, no AF.  Fluid index < threshold but impedance trending down.    Labs (12/16): K 4.9, creatinine 1.23 Labs (1/17): K 4.7, creatinine 1.15, BNP 1433 Labs (2/17): K 4.7, creatinine 1.14, BNP 870 Labs (3/17): K 4.5, creatinine 1.10, BNP 1257 Labs (5/17): K 4.2, creatinine 1.2, BNP 818 Labs (7/17): LDL 149, HDL 40, TGs 210 Labs (9/17): K 4.9, creatinine 1.36 Labs (10/17): K 3.7, creatinine 1.2, LDL 87, HDL 32, LFTs normal Labs (12/17): K 4.5, creatinine 1.34, digoxin 0.3 Labs (2/18): K 4.4, creatinine 1.48, hgb 11.8 Labs (3/18): K 5 => 4.6, creatinine 1.54 => 1.46, digoxin 1.3 => 0.3 Labs (5/18): TSH normal Labs (7/18): K 3.7, creatinine 1.33, hgb 11.4, digoxin 0.6, LFTs normal Labs (8/18): K 5.1, creatinine 1.47, hgb 13.3 Labs (10/18): LDL 50, HDL 40, TGs 247 Labs (11/18): K 5.9, creatinine 1.42, hgb 15.2  ECG (personally reviewed): NSR, IVCD 134 msec, old ASMI  Past Medical History:  1. HYPERTENSION  2. HYPERLIPIDEMIA: Myalgias with atorvastatin and Crestor 3. ECZEMA  4. RHINITIS  5. HERPES ZOSTER OPHTHALMICUS  6. ADENOCARCINOMA, PROSTATE: Status post prostatectomy in 2009. Has had some incontinence since then.  7. Diabetes mellitus type II  8. Arthritis  9. Obesity  10. GERD: rare  11. CAD: Presented with exertional dyspnea, never had chest pain. LHC (2/12) with subtotalled proximal RCA and left to right collaterals, 90% proximal moderate-sized ramus, 95% proximal relatively small CFX, 40-50% proximal LAD. Cardiac MRI (2/12) showed EF 21%, some mild scar in basal segments but all wall  segments would be expected to be viable. DES x 5 (overlapping) to RCA, DES x 1 to RI 01/30/11 .  Bled into leg with Effient use (long, complicated course).  LHC (1/14): AV LCx small with subtotal occlusion, 95% ostial instent restenosis in RCA. PCI in 2/14 to ostial RCA with 3.5 x 15 Xience DES.  Plavix allergy (hives) so put on ticlopidine.  - LHC (12/17):  the RCA was totally occluded with collaterals, there was 95% ostial to mid LAD stenosis, patient had PCI with DES to proximal-mid LAD. - LHC (7/18): Totally occluded RCA with left to right collaterals, subtotal occlusion of small AV LCx, 90% ISR proximal LAD => cutting balloon angioplasty.  12. Ischemic CMP: Echo (2/12) with moderately dilated LV, EF about 20% with  diffuse hypokinesis and inferior akinesis, pseudonormal diastolic function, mild MR, severe LAE, mildly decreased RV systolic function. RHC (2/12) with mean RA 12, PA 40/25, mean PCWP 26, CI 2.1.  Echo (5/12) with EF 40% (appeared worse to my eye) with posterior HK, basal inferior AK, inferoseptal AK, basal anteroseptal AK, mild MR.  Cardiac MRI was repeated and showed EF 32% (improved from 21%) and mild LV dilation (was severely dilated before) with diffuse hypokinesis and subendocardial scar in the basal inferior, basal posterior, and basal anterolateral segments.  Echo (2/13) with EF 45%, moderate LV dilation, mild LVH.  Echo (1/14) with EF 15%, diffuse hypokinesis, inferoposterior akinesis, mild MR, grade I diastolic dysfunction.  Echo (5/14) with EF 30-35%, mild LV dilation, akinesis of the basal inferior wall otherwise diffuse hypokinesis.  Echo (12/14) with EF 30-35%, mild LV dilation, diffuse hypokinesis with basal inferior and posterior akinesis. Echo (12/15) with EF 35%, mildly dilated LV, wall motion abnormalities, normal RV size and systolic function. Echo (12/16) with EF 20-25%, diffuse hypokinesis, severe LV dilation, mild MR.  - Echo (10/17): EF 15%, grade II diastolic dysfunction,  mild MR, mildly decreased RV systolic function.  - Possible angioedema related to Entresto.  - Hyperkalemia with spironolactone 12.5 daily.  - CPX (10/17): peak VO2 10.6, VE/VCO2 slope 48.6, RER 1.12.  Severe HF limitation.  - RHC (12/17): mean RA 14, PA 53/27, mean PCWP 25, CI 1.93.  - Echo (3/18): EF 20-25%, moderate LV dilation, mild LVH, normal RV size and systolic function, mild aortic stenosis.  - Medtronic ICD 3/18.  - Echo (6/18): EF 25-30%, mild AS.  13. Cervical OA.  14. Polymyalgia rheumatica 15. OSA: Severe on sleep study 10/12.  On CPAP.  16. Left lower leg hematoma in setting of Effient use.  He developed a left gastrocnemius abscess and septic shock with prolonged hospitalization beginning in 7/13.  17. PAD: Lower extremity arterial doppler evaluation in 2/17 showed occluded peroneal arteries bilaterally, ABI 1.1 (R) and 1.0 (L).   18. Carotid dopplers (10/17) with minimal disease.  19. Atrial fibrillation: Paroxysmal.  20. Ventricular fibrillation arrest: 3/18.  Medtronic ICD placed, now on amiodarone.  - VT storm 7/18, now s/p VT ablation.  21. Aortic stenosis: Mild on 6/18 echo.  22. Urothelial cancer: s/p TURBT of bladder tumor 11/18.   Family History:  Father died with MI at age 33. He was an alcoholic. Mother died with cancer at around 27.   Social History:  Occupation: retired Administrator  Married, lives in [place]  Past smoker, quit around [place] of systems complete and found to be negative unless listed in HPI.    Current Outpatient Medications  Medication Sig Dispense Refill  . amiodarone (PACERONE) 200 MG tablet Take 200 mg by mouth daily.    . carvedilol (COREG) 12.5 MG tablet Take 1 tablet (12.5 mg total) by mouth 2 (two) times daily. 60 tablet 6  . DIGOX 125 MCG tablet TAKE 1/2 TABLET(0.063 MG) BY MOUTH DAILY 15 tablet 3  . diphenhydramine-acetaminophen (TYLENOL PM) 25-500 MG TABS tablet Take 1 tablet at bedtime as needed by mouth (for  sleep).     . Evolocumab (REPATHA SURECLICK) 952 MG/ML SOAJ Inject 140 mg every 14 (fourteen) days into the skin.     Marland Kitchen glimepiride (AMARYL) 4 MG tablet Take 1 tablet (4 mg total) by mouth daily before breakfast. 30 tablet 5  . losartan (COZAAR) 25 MG tablet Take 12.5 mg by mouth every evening.    Marland Kitchen  Multiple Vitamins-Minerals (CENTRUM SILVER ADULT 50+) TABS Take 1 tablet by mouth daily.    . Omega-3 Fatty Acids (FISH OIL) 1000 MG CAPS Take 1,000 mg by mouth 2 (two) times daily.     . rivaroxaban (XARELTO) 20 MG TABS tablet Take 1 tablet (20 mg total) by mouth daily with supper. 30 tablet 3  . spironolactone (ALDACTONE) 25 MG tablet Take 0.5 tablets (12.5 mg total) by mouth every evening. 15 tablet 6  . furosemide (LASIX) 20 MG tablet Take 1 tablet (20 mg total) by mouth daily. 30 tablet 6  . pravastatin (PRAVACHOL) 20 MG tablet Take 1 tablet (20 mg total) by mouth every evening. 30 tablet 5   No current facility-administered medications for this encounter.    Facility-Administered Medications Ordered in Other Encounters  Medication Dose Route Frequency Provider Last Rate Last Dose  . epirubicin (ELLENCE) 50 mg in sodium chloride 0.9 % bladder instillation  50 mg Bladder Instillation Once Festus Aloe, MD        BP (!) 122/51 (BP Location: Right Arm, Patient Position: Sitting, Cuff Size: Large)   Pulse 86   Wt 233 lb 6.4 oz (105.9 kg)   SpO2 96%   BMI 35.49 kg/m    Wt Readings from Last 3 Encounters:  11/16/17 233 lb 6.4 oz (105.9 kg)  11/09/17 231 lb (104.8 kg)  10/30/17 231 lb (104.8 kg)   General: NAD Neck: JVP 8 cm, no thyromegaly or thyroid nodule.  Lungs: Clear to auscultation bilaterally with normal respiratory effort. CV: Nondisplaced PMI.  Heart regular S1/S2, no S3/S4, 1/6 SEM RUSB.  1+ ankle edema.  No carotid bruit.  Normal pedal pulses.  Abdomen: Soft, nontender, no hepatosplenomegaly, no distention.  Skin: Intact without lesions or rashes.  Neurologic: Alert and  oriented x 3.  Psych: Normal affect. Extremities: No clubbing or cyanosis.  HEENT: Normal.    Assessment/Plan:  1. Chronic systolic CHF: Ischemic cardiomyopathy.  6/18 echo with EF 25-30%, diffuse hypokinesis.  CPX in 10/17 with severe HF limitation.  RHC in 12/17 with elevated filling pressures and low cardiac output.  Medtronic ICD in 3/18 after vfib arrest. He had VT again in 6/18, then VT storm in 7/18 now s/p VT ablation.  NYHA class II symptoms currently, seems more limited by leg/hip/knee pain than dyspnea.  He is mildly volume overloaded on exam.  He has not been taking his Lasix for the last week due to incontinence post-urology procedure. Weight up 3 lbs.   - Restart Lasix at lower dose for now, use 20 mg daily.  Needs to see urology to discuss incontinence/urgency issues.  - Continue current Coreg - He is on losartan 12.5 daily and spironolactone 12.5 daily.  Will not increase today, last K was 5.9 in 11/18.  Need repeat BMET today.   - Continue digoxin 0.0625 daily, check level today.     - He may be an LVAD candidate in the future.   2. CAD:  Now s/p cutting balloon angioplasty to in-stent restenosis in proximal LAD.  He has an occluded RCA but there were left to right collaterals.  No chest pain.  - Continue pravastatin and Repatha.  Good lipids in 10/18.   - He is not on ASA given Xarelto use.  3. HLD: Continue Repatha and low dose pravastatin. Good lipids 10/18.   4. PAD: Occluded peroneal arteries bilaterally on last dopplers.  Now with pain in legs somewhat diffusely including joint.  - Will get repeat peripheral arterial  dopplers.   5. Diabetes type II: Creatinine borderline for empagliflozin use.  6. Atrial fibrillation: NSR today.  - Continue Xarelto 20 mg daily. - Continue amiodarone 200 mg daily   Check LFTs and TSH today.  He will need regular eye exams.  - Would consider atrial fibrillation ablation based on [facility]STLE-HF data if AF recurs. 7. Ventricular fibrillation  arrest: On amiodarone and has Medtronic ICD.  He had VT ablation in 7/18 after episode of VT storm.  - No driving until 9/16.   - Now s/p angioplasty as above. Hope that this will decrease VT burden. No further VT on ICD interrogation.  8. OSA: Not using CPAP 9. CKD: Stage 3.  K has been upper normal to mildly elevated => up to 5.9 when in hospital for urology procedure. Repeat BMET, may need [place].    Loralie Champagne, MD  11/16/2017

## 2017-11-16 NOTE — Telephone Encounter (Signed)
Ok to consult

## 2017-11-16 NOTE — Telephone Encounter (Signed)
Left message

## 2017-11-16 NOTE — Addendum Note (Signed)
Encounter addended by: Kerry Dory, CMA on: 11/16/2017 4:28 PM  Actions taken: Visit diagnoses modified, Order list changed, Diagnosis association updated

## 2017-11-16 NOTE — Patient Instructions (Signed)
Take Furosemide 20 mg (1 tab) daily  Labs drawn today (if we do not call you, then your lab work was stable)   Your physician has requested that you have a peripheral vascular angiogram. This exam is performed at the hospital. During this exam IV contrast is used to look at arterial blood flow. Please review the information sheet given for details.  Your physician recommends that you schedule a follow-up appointment in: 2 months with Dr. Aundra Dubin

## 2017-11-17 NOTE — Telephone Encounter (Signed)
See messages below. I called to schedule an OV with Dr. Ernestina Patches for lower back pain. Patient's wife called back and said that "they" were wanting to order an MRI and for [place] to go ahead and do that if we have a place on the schedule for this. Per Dr. Jess Barters note in October, patient cannot have an MRI and will need a CT Myelogram. Please advise. He has only seen Dr. Ernestina Patches for an EMG so far.

## 2017-11-18 ENCOUNTER — Other Ambulatory Visit (INDEPENDENT_AMBULATORY_CARE_PROVIDER_SITE_OTHER): Payer: Self-pay | Admitting: Family

## 2017-11-18 DIAGNOSIS — M545 Low back pain, unspecified: Secondary | ICD-10-CM

## 2017-11-18 DIAGNOSIS — M79605 Pain in left leg: Secondary | ICD-10-CM

## 2017-11-18 NOTE — Telephone Encounter (Signed)
I am not sure what to do here. The pt had a new dx of cancer and Dr. Sharol Given was hesitant to set up CT myelogram until his was assured of his kidneys being ok. Pt is saying he wants an MRI but cant because he has a defibrillator. Originally they had called to make an appt with FN to discuss the back pain ( they have seen him once prior) and from there this conversation began. I am not sure what we need to do here. Please advise.

## 2017-11-19 ENCOUNTER — Telehealth (HOSPITAL_COMMUNITY): Payer: Self-pay | Admitting: Pharmacist

## 2017-11-19 NOTE — Telephone Encounter (Signed)
Received a call from Mr. Pryde asking if he needed to continue his Repatha. I have called back and left him a VM to call [place] office and ask to speak with Fuller Canada, PharmD, who originally helped him obtain his Repatha.   Ruta Hinds. Velva Harman, PharmD, BCPS, CPP Clinical Pharmacist Pager: [phone] Phone: [phone] 11/19/2017 10:52 AM

## 2017-11-19 NOTE — Telephone Encounter (Signed)
Pt called clinic - advised him he will need to continue [place]. He has been receiving medication for free through the [employer]. I mailed an application to pt for [id number] coverage. He was instructed to fill this out and return completed application to clinic for continued Repatha financial support.

## 2017-12-04 ENCOUNTER — Encounter (HOSPITAL_COMMUNITY): Payer: Medicare Other

## 2017-12-07 ENCOUNTER — Other Ambulatory Visit: Payer: Self-pay | Admitting: Family Medicine

## 2017-12-09 ENCOUNTER — Telehealth: Payer: Self-pay | Admitting: Cardiology

## 2017-12-09 ENCOUNTER — Other Ambulatory Visit: Payer: Self-pay | Admitting: Cardiology

## 2017-12-09 ENCOUNTER — Ambulatory Visit (INDEPENDENT_AMBULATORY_CARE_PROVIDER_SITE_OTHER): Payer: Medicare Other | Admitting: *Deleted

## 2017-12-09 DIAGNOSIS — I472 Ventricular tachycardia, unspecified: Secondary | ICD-10-CM

## 2017-12-09 DIAGNOSIS — I739 Peripheral vascular disease, unspecified: Secondary | ICD-10-CM

## 2017-12-09 NOTE — Telephone Encounter (Signed)
Spoke with pt and reminded pt of remote transmission that is due today. Pt verbalized understanding.   

## 2017-12-10 ENCOUNTER — Telehealth (INDEPENDENT_AMBULATORY_CARE_PROVIDER_SITE_OTHER): Payer: Self-pay | Admitting: Family

## 2017-12-10 ENCOUNTER — Other Ambulatory Visit: Payer: Self-pay | Admitting: Internal Medicine

## 2017-12-10 ENCOUNTER — Encounter: Payer: Self-pay | Admitting: Cardiology

## 2017-12-10 LAB — CUP PACEART REMOTE DEVICE CHECK
Date Time Interrogation Session: 20181226212605
HIGH POWER IMPEDANCE MEASURED VALUE: 69 Ohm
Implantable Lead Implant Date: 20180612
Implantable Pulse Generator Implant Date: 20180103
Lead Channel Pacing Threshold Amplitude: 1 V
Lead Channel Pacing Threshold Pulse Width: 0.4 ms
Lead Channel Sensing Intrinsic Amplitude: 11.25 mV
Lead Channel Sensing Intrinsic Amplitude: 11.25 mV
MDC IDC LEAD LOCATION: 753860
MDC IDC MSMT BATTERY REMAINING LONGEVITY: 122 mo
MDC IDC MSMT BATTERY VOLTAGE: 3.01 V
MDC IDC MSMT LEADCHNL RV IMPEDANCE VALUE: 399 Ohm
MDC IDC MSMT LEADCHNL RV IMPEDANCE VALUE: 456 Ohm
MDC IDC SET LEADCHNL RV PACING AMPLITUDE: 2.5 V
MDC IDC SET LEADCHNL RV PACING PULSEWIDTH: 0.4 ms
MDC IDC SET LEADCHNL RV SENSING SENSITIVITY: 0.3 mV
MDC IDC STAT BRADY RV PERCENT PACED: 0.01 %

## 2017-12-10 NOTE — Progress Notes (Signed)
Remote ICD transmission.   

## 2017-12-14 ENCOUNTER — Other Ambulatory Visit (INDEPENDENT_AMBULATORY_CARE_PROVIDER_SITE_OTHER): Payer: Self-pay | Admitting: Family

## 2017-12-14 DIAGNOSIS — M545 Low back pain, unspecified: Secondary | ICD-10-CM

## 2017-12-14 DIAGNOSIS — M79605 Pain in left leg: Secondary | ICD-10-CM

## 2017-12-14 NOTE — Telephone Encounter (Signed)
IC Roberta at [facility] imaging advising of the order in the wq.

## 2017-12-18 ENCOUNTER — Encounter (HOSPITAL_COMMUNITY): Payer: Self-pay

## 2017-12-18 NOTE — Telephone Encounter (Signed)
Pt is scheduled 01/04/18

## 2017-12-22 ENCOUNTER — Ambulatory Visit (HOSPITAL_COMMUNITY)
Admission: RE | Admit: 2017-12-22 | Discharge: 2017-12-22 | Disposition: A | Discharge status: Home / Self Care | Payer: Medicare Other | Source: Ambulatory Visit | Attending: Cardiovascular Disease | Admitting: Cardiovascular Disease

## 2017-12-22 DIAGNOSIS — E1151 Type 2 diabetes mellitus with diabetic peripheral angiopathy without gangrene: Secondary | ICD-10-CM | POA: Insufficient documentation

## 2017-12-22 DIAGNOSIS — Z87891 Personal history of nicotine dependence: Secondary | ICD-10-CM | POA: Diagnosis not present

## 2017-12-22 DIAGNOSIS — I739 Peripheral vascular disease, unspecified: Secondary | ICD-10-CM | POA: Diagnosis not present

## 2017-12-22 DIAGNOSIS — I2510 Atherosclerotic heart disease of native coronary artery without angina pectoris: Secondary | ICD-10-CM | POA: Diagnosis not present

## 2017-12-22 DIAGNOSIS — I252 Old myocardial infarction: Secondary | ICD-10-CM | POA: Diagnosis not present

## 2017-12-22 DIAGNOSIS — E785 Hyperlipidemia, unspecified: Secondary | ICD-10-CM | POA: Diagnosis not present

## 2017-12-22 DIAGNOSIS — I10 Essential (primary) hypertension: Secondary | ICD-10-CM | POA: Diagnosis not present

## 2017-12-22 DIAGNOSIS — M7989 Other specified soft tissue disorders: Secondary | ICD-10-CM

## 2017-12-25 ENCOUNTER — Telehealth: Payer: Self-pay | Admitting: Pharmacist

## 2017-12-25 NOTE — Telephone Encounter (Signed)
Pt presented to clinic today to fill out new Safety Net Application form for Repatha for 2019 year. Also provided pt with 2 samples to cover pt until his application has been approved.

## 2017-12-29 ENCOUNTER — Telehealth (HOSPITAL_COMMUNITY): Payer: Self-pay | Admitting: *Deleted

## 2017-12-29 NOTE — Telephone Encounter (Signed)
Result Notes for VAS [place] ABI WITH/WO TBI   Notes recorded by Darron Doom, RN on 12/29/2017 at 11:04 AM EST Patient called back and he is aware of results, no further questions. ------  Notes recorded by Shirley Muscat, RN on 12/28/2017 at 4:37 PM EST Left VM  ------  Notes recorded by Shirley Muscat, RN on 12/24/2017 at 11:26 AM EST Left VM  ------  Notes recorded by Larey Dresser, MD on 12/22/2017 at 3:02 PM EST Normal ABIs

## 2017-12-31 ENCOUNTER — Telehealth: Payer: Self-pay | Admitting: Pharmacist

## 2017-12-31 MED ORDER — ROSUVASTATIN CALCIUM 10 MG PO TABS: 10.0000 mg | ORAL_TABLET | Freq: Every day | ORAL | 11 refills | Status: DC

## 2017-12-31 NOTE — Telephone Encounter (Addendum)
Pt was denied for continued Repatha financial assistance through [employer] because he doesn't have any medication insurance but he qualifies for Medicare. He was approved last year under a one-time temporary enrollment. He will only be covered for Repatha assistance again if he has proof that he is not eligible for Medicare benefits, which is not the case.   Pt is intolerant to multiple statins and was a consideration for the CLEAR trial studying bempedoic acid, however pt had a recent diagnosis of bladder cancer which will exclude him.  I encouraged pt to sign up for a Medicare Part D plan as this is the only way he will be able to restart Repatha. Discussed rechallenging with statin therapy (pt is already intolerant to pravastatin 40mg  and 80mg  daily, simvastatin 20mg  daily, Crestor 10mg , 20mg , and 40mg  daily, atorvastatin). Pt would like to retry Crestor 10mg  daily. Rx has been sent in.  Will route to Dr Aundra Dubin as an Juluis Rainier.

## 2018-01-04 ENCOUNTER — Ambulatory Visit
Admission: RE | Admit: 2018-01-04 | Discharge: 2018-01-04 | Disposition: A | Discharge status: Home / Self Care | Payer: Medicare Other | Source: Ambulatory Visit | Attending: Family | Admitting: Family

## 2018-01-04 DIAGNOSIS — M5126 Other intervertebral disc displacement, lumbar region: Secondary | ICD-10-CM | POA: Diagnosis not present

## 2018-01-04 DIAGNOSIS — M545 Low back pain, unspecified: Secondary | ICD-10-CM

## 2018-01-04 DIAGNOSIS — M79605 Pain in left leg: Secondary | ICD-10-CM

## 2018-01-04 MED ORDER — IOPAMIDOL (ISOVUE-M 200) INJECTION 41%
15.0000 mL | Freq: Once | INTRAMUSCULAR | Status: AC
  Administered 2018-01-04: 15 mL via INTRATHECAL

## 2018-01-04 MED ORDER — DIAZEPAM 5 MG PO TABS
5.0000 mg | ORAL_TABLET | Freq: Once | ORAL | Status: AC
  Administered 2018-01-04: 5 mg via ORAL

## 2018-01-04 NOTE — Progress Notes (Signed)
Patient states he has been off Xarelto for at least the past two days.  Brita Romp, RN

## 2018-01-04 NOTE — Discharge Instructions (Signed)

## 2018-01-05 ENCOUNTER — Telehealth (INDEPENDENT_AMBULATORY_CARE_PROVIDER_SITE_OTHER): Payer: Self-pay | Admitting: Physical Medicine and Rehabilitation

## 2018-01-14 ENCOUNTER — Other Ambulatory Visit (HOSPITAL_COMMUNITY): Payer: Self-pay | Admitting: Student

## 2018-01-18 ENCOUNTER — Ambulatory Visit (HOSPITAL_COMMUNITY)
Admission: RE | Admit: 2018-01-18 | Discharge: 2018-01-18 | Disposition: A | Discharge status: Home / Self Care | Payer: Medicare Other | Source: Ambulatory Visit | Attending: Cardiology | Admitting: Cardiology

## 2018-01-18 ENCOUNTER — Encounter (HOSPITAL_COMMUNITY): Payer: Self-pay | Admitting: Cardiology

## 2018-01-18 ENCOUNTER — Encounter: Payer: Medicare Other | Admitting: *Deleted

## 2018-01-18 VITALS — BP 143/57 | HR 60 | Wt 238.0 lb

## 2018-01-18 VITALS — BP 155/62 | HR 64 | Wt 238.9 lb

## 2018-01-18 DIAGNOSIS — M353 Polymyalgia rheumatica: Secondary | ICD-10-CM | POA: Insufficient documentation

## 2018-01-18 DIAGNOSIS — I130 Hypertensive heart and chronic kidney disease with heart failure and stage 1 through stage 4 chronic kidney disease, or unspecified chronic kidney disease: Secondary | ICD-10-CM | POA: Insufficient documentation

## 2018-01-18 DIAGNOSIS — I5022 Chronic systolic (congestive) heart failure: Secondary | ICD-10-CM | POA: Diagnosis not present

## 2018-01-18 DIAGNOSIS — Z8551 Personal history of malignant neoplasm of bladder: Secondary | ICD-10-CM | POA: Insufficient documentation

## 2018-01-18 DIAGNOSIS — E1122 Type 2 diabetes mellitus with diabetic chronic kidney disease: Secondary | ICD-10-CM | POA: Diagnosis not present

## 2018-01-18 DIAGNOSIS — Z87891 Personal history of nicotine dependence: Secondary | ICD-10-CM | POA: Diagnosis not present

## 2018-01-18 DIAGNOSIS — Z95810 Presence of automatic (implantable) cardiac defibrillator: Secondary | ICD-10-CM | POA: Insufficient documentation

## 2018-01-18 DIAGNOSIS — I480 Paroxysmal atrial fibrillation: Secondary | ICD-10-CM | POA: Diagnosis not present

## 2018-01-18 DIAGNOSIS — I350 Nonrheumatic aortic (valve) stenosis: Secondary | ICD-10-CM | POA: Diagnosis not present

## 2018-01-18 DIAGNOSIS — E669 Obesity, unspecified: Secondary | ICD-10-CM | POA: Insufficient documentation

## 2018-01-18 DIAGNOSIS — G4733 Obstructive sleep apnea (adult) (pediatric): Secondary | ICD-10-CM | POA: Diagnosis not present

## 2018-01-18 DIAGNOSIS — N183 Chronic kidney disease, stage 3 unspecified: Secondary | ICD-10-CM

## 2018-01-18 DIAGNOSIS — M1990 Unspecified osteoarthritis, unspecified site: Secondary | ICD-10-CM | POA: Insufficient documentation

## 2018-01-18 DIAGNOSIS — K219 Gastro-esophageal reflux disease without esophagitis: Secondary | ICD-10-CM | POA: Insufficient documentation

## 2018-01-18 DIAGNOSIS — Z9889 Other specified postprocedural states: Secondary | ICD-10-CM | POA: Diagnosis not present

## 2018-01-18 DIAGNOSIS — Z955 Presence of coronary angioplasty implant and graft: Secondary | ICD-10-CM | POA: Insufficient documentation

## 2018-01-18 DIAGNOSIS — Z7901 Long term (current) use of anticoagulants: Secondary | ICD-10-CM | POA: Diagnosis not present

## 2018-01-18 DIAGNOSIS — I2510 Atherosclerotic heart disease of native coronary artery without angina pectoris: Secondary | ICD-10-CM | POA: Insufficient documentation

## 2018-01-18 DIAGNOSIS — I462 Cardiac arrest due to underlying cardiac condition: Secondary | ICD-10-CM | POA: Insufficient documentation

## 2018-01-18 DIAGNOSIS — E785 Hyperlipidemia, unspecified: Secondary | ICD-10-CM | POA: Insufficient documentation

## 2018-01-18 DIAGNOSIS — E875 Hyperkalemia: Secondary | ICD-10-CM | POA: Diagnosis not present

## 2018-01-18 DIAGNOSIS — Z79899 Other long term (current) drug therapy: Secondary | ICD-10-CM | POA: Insufficient documentation

## 2018-01-18 DIAGNOSIS — I255 Ischemic cardiomyopathy: Secondary | ICD-10-CM | POA: Diagnosis not present

## 2018-01-18 DIAGNOSIS — Z8546 Personal history of malignant neoplasm of prostate: Secondary | ICD-10-CM | POA: Insufficient documentation

## 2018-01-18 DIAGNOSIS — Z006 Encounter for examination for normal comparison and control in clinical research program: Secondary | ICD-10-CM

## 2018-01-18 DIAGNOSIS — Z7984 Long term (current) use of oral hypoglycemic drugs: Secondary | ICD-10-CM | POA: Insufficient documentation

## 2018-01-18 LAB — COMPREHENSIVE METABOLIC PANEL
ALK PHOS: 76 U/L (ref 38–126)
ALT: 26 U/L (ref 17–63)
ANION GAP: 12 (ref 5–15)
AST: 26 U/L (ref 15–41)
Albumin: 3.5 g/dL (ref 3.5–5.0)
BUN: 24 mg/dL — ABNORMAL HIGH (ref 6–20)
CALCIUM: 9.6 mg/dL (ref 8.9–10.3)
CO2: 23 mmol/L (ref 22–32)
Chloride: 101 mmol/L (ref 101–111)
Creatinine, Ser: 1.32 mg/dL — ABNORMAL HIGH (ref 0.61–1.24)
GFR calc non Af Amer: 51 mL/min — ABNORMAL LOW (ref >60)
GFR, EST AFRICAN AMERICAN: 60 mL/min — AB (ref >60)
Glucose, Bld: 326 mg/dL — ABNORMAL HIGH (ref 65–99)
Potassium: 5.3 mmol/L — ABNORMAL HIGH (ref 3.5–5.1)
SODIUM: 136 mmol/L (ref 135–145)
Total Bilirubin: 0.8 mg/dL (ref 0.3–1.2)
Total Protein: 6.6 g/dL (ref 6.5–8.1)

## 2018-01-18 LAB — CBC
HCT: 45.6 % (ref 39.0–52.0)
Hemoglobin: 14.8 g/dL (ref 13.0–17.0)
MCH: 28.9 pg (ref 26.0–34.0)
MCHC: 32.5 g/dL (ref 30.0–36.0)
MCV: 89.1 fL (ref 78.0–100.0)
PLATELETS: 146 10*3/uL — AB (ref 150–400)
RBC: 5.12 MIL/uL (ref 4.22–5.81)
RDW: 13.5 % (ref 11.5–15.5)
WBC: 5.9 10*3/uL (ref 4.0–10.5)

## 2018-01-18 LAB — TSH: TSH: 2.167 u[IU]/mL (ref 0.350–4.500)

## 2018-01-18 LAB — DIGOXIN LEVEL: DIGOXIN LVL: 0.9 ng/mL (ref 0.8–2.0)

## 2018-01-18 MED ORDER — FUROSEMIDE 20 MG PO TABS: 20.0000 mg | ORAL_TABLET | Freq: Two times a day (BID) | ORAL | 6 refills | Status: DC

## 2018-01-18 MED ORDER — LOSARTAN POTASSIUM 25 MG PO TABS: 25.0000 mg | ORAL_TABLET | Freq: Two times a day (BID) | ORAL | 3 refills | Status: DC

## 2018-01-18 NOTE — Progress Notes (Signed)
RESEARCH ENCOUNTER  Patient ID: Wayne [name]  DOB: 08-01-1941  Kelton Pillar presented to the [facility] for the BeAT HF 15 month visit.  Overall, patient in good spirits.  States he has gained some weight and has had some issues with diet compliance.  All research activities completed without event.  Subject states that he has recovered from his surgery in Nov and he has an appt with oncology on the 10th.  Will continue to follow up.

## 2018-01-18 NOTE — Progress Notes (Signed)
Patient ID: Wayne [name], male   DOB: [birth year], 77 y.o.   MRN: [id number]   Advanced Heart Failure Clinic Note   Patient ID: Wayne [name], male   DOB: [birth year], 77 y.o.   MRN: [id number] PCP: Dr. Elease Hashimoto Cardiology: Dr. Earline Mayotte Wayne [name] is a 77 y.o. male  with history of DM, HTN, CAD, and ischemic cardiomyopathy who presents for followup of CHF and CAD. Patient had a CHF exacerbation in 2/12 and was found to have LV systolic dysfunction with EF around 20%. LHC showed RCA, ramus, and CFX disease. RCA was subtotally occluded. Cardiac MRI showed that all walls, including the inferior wall, should be viable. Patient therefore underwent opening of his chronic totally occluded RCA as well as PCI to the ramus in 2/12. He received drug eluting stents and was on Effient.  He had an echo in 2/13 that showed EF improved to 45% with moderate LV dilation and mild LV hypertrophy.   In 5/13, he developed a large left lower leg hematoma.  He was still on Effient at that time.  ASA and Effient were stopped.  The hematoma did not resolve.  In 7/13, he was re-admitted with septic shock from MSSA from an abscess in his left gastrocnemius.  He also grew Pseudomonas from the left gastrocnemius as well.  He had a prolonged course in the hospital and later in a rehab facility.  Ultimately, he got back home again.  I had him get an echo in 1/14, and this showed EF 15% with diffuse hypokinesis and inferoposterior akinesis.  He had been off of most of his prior cardiac medications.  Took him back for [facility] in 1/14.  This showed subtotal occlusion of a small AV LCx and 95% ostial in-stent restenosis in the RCA.  He was treated in 2/14 with a Xience DES to the ostial RCA and begun on Plavix.  Unfortunately, he developed diffuse hives after starting Plavix and had to be switched to ticlopidine.  He has tolerated ticlopidine.   Echo done in 12/14 showed some improvement in LV function but EF was still low at 30-35%.  He did not want  ICD.    Presented to ED 11/14/15 with worsening SOB after a few steps and CXR with CHF and bilateral effusions in the setting of marked medical non-compliance. States he stopped taking his medicines in 01/2015 (except for ASA 81 and a multivitamin).  He was feeling good and decided that he did not need them anymore.  Also c/o URI symptoms. Diuresed over 2 L with IV diuretics in the ER and started on lasix 20 mg daily.   Echo (12/16) showed EF 20-25% with diffuse hypokinesis (down from 35% in 12/15).  Echo 10/17 showed EF 15% with mildly decreased RV systolic function.    He had left and right heart catheterization in 12/17. This showed elevated right and left heart filling pressures and low cardiac output. The RCA was totally occluded with collaterals, there was 95% ostial to mid LAD stenosis.  Patient had PCI with DES to proximal-mid LAD.    He was noted to be in atrial fibrillation with RVR in 2/18 at cardiac rehab. He felt fatigued.  He was admitted and started on amiodarone for rate control. ASA was stopped, ticagrelor was decreased to 60 mg bid, and Xarelto 15 mg daily was started.  He converted spontaneously back to NSR.    He suffered a ventricular fibrillation arrest in 3/18.  Luckily, he was wearing  a Lifevest and was shocked.  He was admitted and started on amiodarone.  Medtronic ICD was placed.  Echo 3/18 with EF 20-25%.   Admitted again 6/18 due to VF arrest. ICD lead had been undersensing leading to prolongation of time to discharge.  He underwent RV lead extraction with new lead implant.   He was admitted again with VT storm in 7/18.  No prior chest pain or symptoms of worsening CAD.  He had VT ablation this admission.   Admitted with ICD shock 07/05/17. He had coronary angiography this admission.  Had cutting balloon angioplasty of severe in-stent restenosis in the proximal LAD. Pt had additional VT 07/07/17, EP saw and recommended BB titration. Po amio up-titrated. Discharge weight 228  lbs  He had TURBT 11/18, urothelial cancer diagnosed.    He returns today for followup of CHF.  Weight is up 5 lbs.  He has not been as physically active. BP is running higher. He denies exertional dyspnea.  No orthopnea/PND. Main problem currently is hip and knee pain.  He is not taking his Lasix bid every day. No chest pain. No lightheadedness.     Medtronic device interrogated:  No VT, no AF.  Fluid index < threshold with stable impedance.   Labs (12/16): K 4.9, creatinine 1.23 Labs (1/17): K 4.7, creatinine 1.15, BNP 1433 Labs (2/17): K 4.7, creatinine 1.14, BNP 870 Labs (3/17): K 4.5, creatinine 1.10, BNP 1257 Labs (5/17): K 4.2, creatinine 1.2, BNP 818 Labs (7/17): LDL 149, HDL 40, TGs 210 Labs (9/17): K 4.9, creatinine 1.36 Labs (10/17): K 3.7, creatinine 1.2, LDL 87, HDL 32, LFTs normal Labs (12/17): K 4.5, creatinine 1.34, digoxin 0.3 Labs (2/18): K 4.4, creatinine 1.48, hgb 11.8 Labs (3/18): K 5 => 4.6, creatinine 1.54 => 1.46, digoxin 1.3 => 0.3 Labs (5/18): TSH normal Labs (7/18): K 3.7, creatinine 1.33, hgb 11.4, digoxin 0.6, LFTs normal Labs (8/18): K 5.1, creatinine 1.47, hgb 13.3 Labs (10/18): LDL 50, HDL 40, TGs 247 Labs (11/18): K 5.9, creatinine 1.42, hgb 15.2 Labs (12/18): K 5, creatinine 1.27, digoxin 0.6, LFTs normal, TSH normal  Past Medical History:  1. HYPERTENSION  2. HYPERLIPIDEMIA: Myalgias with atorvastatin and Crestor 3. ECZEMA  4. RHINITIS  5. HERPES ZOSTER OPHTHALMICUS  6. ADENOCARCINOMA, PROSTATE: Status post prostatectomy in 2009. Has had some incontinence since then.  7. Diabetes mellitus type II  8. Arthritis  9. Obesity  10. GERD: rare  11. CAD: Presented with exertional dyspnea, never had chest pain. LHC (2/12) with subtotalled proximal RCA and left to right collaterals, 90% proximal moderate-sized ramus, 95% proximal relatively small CFX, 40-50% proximal LAD. Cardiac MRI (2/12) showed EF 21%, some mild scar in basal segments but all wall  segments would be expected to be viable. DES x 5 (overlapping) to RCA, DES x 1 to RI 01/30/11 .  Bled into leg with Effient use (long, complicated course).  LHC (1/14): AV LCx small with subtotal occlusion, 95% ostial instent restenosis in RCA. PCI in 2/14 to ostial RCA with 3.5 x 15 Xience DES.  Plavix allergy (hives) so put on ticlopidine.  - LHC (12/17):  the RCA was totally occluded with collaterals, there was 95% ostial to mid LAD stenosis, patient had PCI with DES to proximal-mid LAD. - LHC (7/18): Totally occluded RCA with left to right collaterals, subtotal occlusion of small AV LCx, 90% ISR proximal LAD => cutting balloon angioplasty.  12. Ischemic CMP: Echo (2/12) with moderately dilated LV, EF about 20% with  diffuse hypokinesis and inferior akinesis, pseudonormal diastolic function, mild MR, severe LAE, mildly decreased RV systolic function. RHC (2/12) with mean RA 12, PA 40/25, mean PCWP 26, CI 2.1.  Echo (5/12) with EF 40% (appeared worse to my eye) with posterior HK, basal inferior AK, inferoseptal AK, basal anteroseptal AK, mild MR.  Cardiac MRI was repeated and showed EF 32% (improved from 21%) and mild LV dilation (was severely dilated before) with diffuse hypokinesis and subendocardial scar in the basal inferior, basal posterior, and basal anterolateral segments.  Echo (2/13) with EF 45%, moderate LV dilation, mild LVH.  Echo (1/14) with EF 15%, diffuse hypokinesis, inferoposterior akinesis, mild MR, grade I diastolic dysfunction.  Echo (5/14) with EF 30-35%, mild LV dilation, akinesis of the basal inferior wall otherwise diffuse hypokinesis.  Echo (12/14) with EF 30-35%, mild LV dilation, diffuse hypokinesis with basal inferior and posterior akinesis. Echo (12/15) with EF 35%, mildly dilated LV, wall motion abnormalities, normal RV size and systolic function. Echo (12/16) with EF 20-25%, diffuse hypokinesis, severe LV dilation, mild MR.  - Echo (10/17): EF 15%, grade II diastolic dysfunction,  mild MR, mildly decreased RV systolic function.  - Possible angioedema related to Entresto.  - Hyperkalemia with spironolactone 12.5 daily.  - CPX (10/17): peak VO2 10.6, VE/VCO2 slope 48.6, RER 1.12.  Severe HF limitation.  - RHC (12/17): mean RA 14, PA 53/27, mean PCWP 25, CI 1.93.  - Echo (3/18): EF 20-25%, moderate LV dilation, mild LVH, normal RV size and systolic function, mild aortic stenosis.  - Medtronic ICD 3/18.  - Echo (6/18): EF 25-30%, mild AS.  13. Cervical OA.  14. Polymyalgia rheumatica 15. OSA: Severe on sleep study 10/12.  On CPAP.  16. Left lower leg hematoma in setting of Effient use.  He developed a left gastrocnemius abscess and septic shock with prolonged hospitalization beginning in 7/13.  17. PAD: Lower extremity arterial doppler evaluation in 2/17 showed occluded peroneal arteries bilaterally, ABI 1.1 (R) and 1.0 (L).   - ABIs normal in 1/19.  18. Carotid dopplers (10/17) with minimal disease.  19. Atrial fibrillation: Paroxysmal.  20. Ventricular fibrillation arrest: 3/18.  Medtronic ICD placed, now on amiodarone.  - VT storm 7/18, now s/p VT ablation.  21. Aortic stenosis: Mild on 6/18 echo.  22. Urothelial cancer: s/p TURBT of bladder tumor 11/18.   Family History:  Father died with MI at age 40. He was an alcoholic. Mother died with cancer at around 41.   Social History:  Occupation: retired Administrator  Married, lives in [place]  Past smoker, quit around [place] of systems complete and found to be negative unless listed in HPI.    Current Outpatient Medications  Medication Sig Dispense Refill  . amiodarone (PACERONE) 200 MG tablet Take 200 mg by mouth daily.    . carvedilol (COREG) 12.5 MG tablet Take 1 tablet (12.5 mg total) by mouth 2 (two) times daily. 60 tablet 6  . DIGOX 125 MCG tablet TAKE 1/2 TABLET(0.063 MG) BY MOUTH DAILY 15 tablet 3  . diphenhydramine-acetaminophen (TYLENOL PM) 25-500 MG TABS tablet Take 1 tablet at bedtime as  needed by mouth (for sleep).     . furosemide (LASIX) 20 MG tablet Take 1 tablet (20 mg total) by mouth 2 (two) times daily. 30 tablet 6  . glimepiride (AMARYL) 4 MG tablet Take 1 tablet (4 mg total) by mouth daily before breakfast. 30 tablet 5  . Multiple Vitamins-Minerals (CENTRUM SILVER ADULT 50+) TABS  Take 1 tablet by mouth daily.    . Omega-3 Fatty Acids (FISH OIL) 1000 MG CAPS Take 1,000 mg by mouth 2 (two) times daily.     . rivaroxaban (XARELTO) 20 MG TABS tablet Take 1 tablet (20 mg total) by mouth daily with supper. 30 tablet 3  . rosuvastatin (CRESTOR) 10 MG tablet Take 1 tablet (10 mg total) by mouth daily. 30 tablet 11  . spironolactone (ALDACTONE) 25 MG tablet Take 0.5 tablets (12.5 mg total) by mouth every evening. 15 tablet 6  . TRUEPLUS LANCETS 30G MISC CHECK BLOOD SUGAR ONCE TO TWICE DAILY 200 each 0  . losartan (COZAAR) 25 MG tablet Take 1 tablet (25 mg total) by mouth 2 (two) times daily. 60 tablet 3   No current facility-administered medications for this encounter.    Facility-Administered Medications Ordered in Other Encounters  Medication Dose Route Frequency Provider Last Rate Last Dose  . epirubicin (ELLENCE) 50 mg in sodium chloride 0.9 % bladder instillation  50 mg Bladder Instillation Once Festus Aloe, MD        BP (!) 155/62   Pulse 64   Wt 238 lb 14.4 oz (108.4 kg)   SpO2 95%   BMI 36.32 kg/m    Wt Readings from Last 3 Encounters:  01/18/18 238 lb (108 kg)  01/18/18 238 lb 14.4 oz (108.4 kg)  11/16/17 233 lb 6.4 oz (105.9 kg)   General: NAD Neck: No JVD, no thyromegaly or thyroid nodule.  Lungs: Clear to auscultation bilaterally with normal respiratory effort. CV: Nondisplaced PMI.  Heart regular S1/S2, no S3/S4, 1/6 SEM RUSB. 1+ edema 1/3 up lower legs bilaterally.  No carotid bruit.  Normal pedal pulses.  Abdomen: Soft, nontender, no hepatosplenomegaly, no distention.  Skin: Intact without lesions or rashes.  Neurologic: Alert and oriented x  3.  Psych: Normal affect. Extremities: No clubbing or cyanosis.  HEENT: Normal.   Assessment/Plan:  1. Chronic systolic CHF: Ischemic cardiomyopathy.  6/18 echo with EF 25-30%, diffuse hypokinesis.  CPX in 10/17 with severe HF limitation.  RHC in 12/17 with elevated filling pressures and low cardiac output.  Medtronic ICD in 3/18 after vfib arrest. He had VT again in 6/18, then VT storm in 7/18 now s/p VT ablation.  NYHA class II. He is not volume overloaded on exam or by Optivol.  - I encouraged him to take Lasix 20 mg bid every day (often skips pm dose).  - Continue current Coreg - Continue spironolactone 12.5 daily.   - Increase losartan to 25 mg bid. He had angioedema with Entresto.   - Continue digoxin 0.0625 daily, check level today.     - He may be an LVAD candidate in the future.   2. CAD:  Now s/p cutting balloon angioplasty to in-stent restenosis in proximal LAD.  He has an occluded RCA but there were left to right collaterals.  No chest pain.  - He has been on Repatha with good lipid control.  Unfortunately, he will not be able to get Repatha in the future as he does not have Medicare part D.  He will go back on Crestor 10 mg daily.  Lipids/LFTs in 2 months.  - He is not on ASA given Xarelto use.  3. HLD: See discussion above regarding restarting Crestor.    4. PAD: Normal ABIs in 1/19.    5. Diabetes type II: Continue empagliflozin.  6. Atrial fibrillation: NSR today.  - Continue Xarelto 20 mg daily. - Continue amiodarone 200  mg daily   Check LFTs and TSH today.  He will need regular eye exams.  - Would consider atrial fibrillation ablation based on CASTLE-HF data if AF recurs. 7. Ventricular fibrillation arrest: On amiodarone and has Medtronic ICD.  He had VT ablation in 7/18 after episode of VT storm. No VT on ICD interrogation.  8. OSA: Not using CPAP 9. CKD: Stage 3.  K has been upper normal to mildly elevated => May need to add Veltassa in order to safely increase losartan.   Will see what K is today.   Loralie Champagne, MD  01/18/2018

## 2018-01-18 NOTE — Patient Instructions (Signed)
Increase Losartan to 25 mg Twice daily   Labs today  Your physician recommends that you schedule a follow-up appointment in: 6 weeks

## 2018-01-20 ENCOUNTER — Telehealth (INDEPENDENT_AMBULATORY_CARE_PROVIDER_SITE_OTHER): Payer: Self-pay | Admitting: Physical Medicine and Rehabilitation

## 2018-01-20 ENCOUNTER — Ambulatory Visit (INDEPENDENT_AMBULATORY_CARE_PROVIDER_SITE_OTHER): Payer: Medicare Other | Admitting: Physical Medicine and Rehabilitation

## 2018-01-20 ENCOUNTER — Encounter (INDEPENDENT_AMBULATORY_CARE_PROVIDER_SITE_OTHER): Payer: Self-pay | Admitting: Physical Medicine and Rehabilitation

## 2018-01-20 VITALS — BP 132/65 | HR 62

## 2018-01-20 DIAGNOSIS — M545 Low back pain, unspecified: Secondary | ICD-10-CM

## 2018-01-20 DIAGNOSIS — R2689 Other abnormalities of gait and mobility: Secondary | ICD-10-CM | POA: Diagnosis not present

## 2018-01-20 DIAGNOSIS — M47816 Spondylosis without myelopathy or radiculopathy, lumbar region: Secondary | ICD-10-CM

## 2018-01-20 DIAGNOSIS — M5116 Intervertebral disc disorders with radiculopathy, lumbar region: Secondary | ICD-10-CM | POA: Diagnosis not present

## 2018-01-20 DIAGNOSIS — G8929 Other chronic pain: Secondary | ICD-10-CM | POA: Diagnosis not present

## 2018-01-20 DIAGNOSIS — R269 Unspecified abnormalities of gait and mobility: Secondary | ICD-10-CM

## 2018-01-20 DIAGNOSIS — M5416 Radiculopathy, lumbar region: Secondary | ICD-10-CM | POA: Diagnosis not present

## 2018-01-20 NOTE — Progress Notes (Deleted)
Lower back pain- more on right;  posterior hip pain- left more than right. Swelling in left knee, but has history of left knee surgery and infection. Patient states he does not have good balance. Walking causes pain to increase. Limited in how far he can walk. Usually ok when he first gets up, but pain increases after walking a while.

## 2018-01-20 NOTE — Telephone Encounter (Signed)
That would be ok.

## 2018-01-22 NOTE — Telephone Encounter (Signed)
Called patient and he will hold his Xarelto 2 daus prior to his injection. I also reiterated that he does need a driver.

## 2018-01-24 DIAGNOSIS — S81802A Unspecified open wound, left lower leg, initial encounter: Secondary | ICD-10-CM | POA: Diagnosis not present

## 2018-01-25 ENCOUNTER — Other Ambulatory Visit (HOSPITAL_COMMUNITY): Payer: Medicare Other

## 2018-01-25 DIAGNOSIS — R3915 Urgency of urination: Secondary | ICD-10-CM | POA: Diagnosis not present

## 2018-01-25 DIAGNOSIS — Z8551 Personal history of malignant neoplasm of bladder: Secondary | ICD-10-CM | POA: Diagnosis not present

## 2018-01-25 NOTE — Telephone Encounter (Signed)
Closing encounter

## 2018-01-26 ENCOUNTER — Ambulatory Visit (HOSPITAL_COMMUNITY)
Admission: RE | Admit: 2018-01-26 | Discharge: 2018-01-26 | Disposition: A | Discharge status: Home / Self Care | Payer: Medicare Other | Source: Ambulatory Visit | Attending: Cardiology | Admitting: Cardiology

## 2018-01-26 DIAGNOSIS — I5022 Chronic systolic (congestive) heart failure: Secondary | ICD-10-CM | POA: Insufficient documentation

## 2018-01-26 LAB — BASIC METABOLIC PANEL
Anion gap: 11 (ref 5–15)
BUN: 18 mg/dL (ref 6–20)
CO2: 26 mmol/L (ref 22–32)
Calcium: 10.1 mg/dL (ref 8.9–10.3)
Chloride: 102 mmol/L (ref 101–111)
Creatinine, Ser: 1.41 mg/dL — ABNORMAL HIGH (ref 0.61–1.24)
GFR calc Af Amer: 55 mL/min — ABNORMAL LOW (ref >60)
GFR calc non Af Amer: 48 mL/min — ABNORMAL LOW (ref >60)
Glucose, Bld: 257 mg/dL — ABNORMAL HIGH (ref 65–99)
Potassium: 5.1 mmol/L (ref 3.5–5.1)
Sodium: 139 mmol/L (ref 135–145)

## 2018-02-01 ENCOUNTER — Encounter (INDEPENDENT_AMBULATORY_CARE_PROVIDER_SITE_OTHER): Payer: Self-pay | Admitting: Physical Medicine and Rehabilitation

## 2018-02-01 ENCOUNTER — Ambulatory Visit (INDEPENDENT_AMBULATORY_CARE_PROVIDER_SITE_OTHER): Payer: Medicare Other | Admitting: Physical Medicine and Rehabilitation

## 2018-02-01 ENCOUNTER — Ambulatory Visit (INDEPENDENT_AMBULATORY_CARE_PROVIDER_SITE_OTHER): Payer: Medicare Other

## 2018-02-01 VITALS — BP 125/72 | HR 61 | Temp 98.2°F

## 2018-02-01 DIAGNOSIS — R251 Tremor, unspecified: Secondary | ICD-10-CM

## 2018-02-01 DIAGNOSIS — M5116 Intervertebral disc disorders with radiculopathy, lumbar region: Secondary | ICD-10-CM

## 2018-02-01 DIAGNOSIS — R2689 Other abnormalities of gait and mobility: Secondary | ICD-10-CM

## 2018-02-01 DIAGNOSIS — R269 Unspecified abnormalities of gait and mobility: Secondary | ICD-10-CM

## 2018-02-01 DIAGNOSIS — M5416 Radiculopathy, lumbar region: Secondary | ICD-10-CM | POA: Diagnosis not present

## 2018-02-01 DIAGNOSIS — M47816 Spondylosis without myelopathy or radiculopathy, lumbar region: Secondary | ICD-10-CM

## 2018-02-01 MED ORDER — BETAMETHASONE SOD PHOS & ACET 6 (3-3) MG/ML IJ SUSP
12.0000 mg | Freq: Once | INTRAMUSCULAR | Status: AC
  Administered 2018-02-01: 12 mg

## 2018-02-01 NOTE — Patient Instructions (Signed)

## 2018-02-01 NOTE — Progress Notes (Deleted)
Pt states a sharp aching pain in lower back that radiates through the right leg. Pt states both right and left hip sometimes hurt. Pt states pain has been going on more that 1 year. Pt states walking makes the pain worse and causes him to stumble over his right leg. Pt states tylenol gives some relief, but not much. +Driver, -BT (xarelto; stopped 01/30/18), -Dye Allergies.

## 2018-02-02 ENCOUNTER — Encounter (INDEPENDENT_AMBULATORY_CARE_PROVIDER_SITE_OTHER): Payer: Self-pay | Admitting: Physical Medicine and Rehabilitation

## 2018-02-02 NOTE — Procedures (Signed)
Lumbosacral Transforaminal Epidural Steroid Injection - Sub-Pedicular Approach with Fluoroscopic Guidance  Patient: Wayne [name]      Date of Birth: [birth year] MRN: [id number] PCP: [name], MD      Visit Date: 02/01/2018   Universal Protocol:    Date/Time: 02/01/2018  Consent Given By: the patient  Position: PRONE  Additional Comments: Vital signs were monitored before and after the procedure. Patient was prepped and draped in the usual sterile fashion. The correct patient, procedure, and site was verified.   Injection Procedure Details:  Procedure Site One Meds Administered:  Meds ordered this encounter  Medications  . betamethasone acetate-betamethasone sodium phosphate (CELESTONE) injection 12 mg    Laterality: Left  Location/Site:  L5-S1  Needle size: 22 G  Needle type: Spinal  Needle Placement: Transforaminal  Findings:    -Comments: Excellent flow of contrast along the nerve and into the epidural space.  Procedure Details: After squaring off the end-plates to get a true AP view, the C-arm was positioned so that an oblique view of the foramen as noted above was visualized. The target area is just inferior to the "nose of the scotty dog" or sub pedicular. The soft tissues overlying this structure were infiltrated with 2-3 ml. of 1% Lidocaine without Epinephrine.  The spinal needle was inserted toward the target using a "trajectory" view along the fluoroscope beam.  Under AP and lateral visualization, the needle was advanced so it did not puncture dura and was located close the 6 O'Clock position of the pedical in AP tracterory. Biplanar projections were used to confirm position. Aspiration was confirmed to be negative for CSF and/or blood. A 1-2 ml. volume of Isovue-250 was injected and flow of contrast was noted at each level. Radiographs were obtained for documentation purposes.   After attaining the desired flow of contrast documented above, a 0.5 to  1.0 ml test dose of 0.25% Marcaine was injected into each respective transforaminal space.  The patient was observed for 90 seconds post injection.  After no sensory deficits were reported, and normal lower extremity motor function was noted,   the above injectate was administered so that equal amounts of the injectate were placed at each foramen (level) into the transforaminal epidural space.   Additional Comments:  The patient tolerated the procedure well Dressing: Band-Aid    Post-procedure details: Patient was observed during the procedure. Post-procedure instructions were reviewed.  Patient left the clinic in stable condition.

## 2018-02-02 NOTE — Progress Notes (Signed)
MITHRAN STRIKE - 77 y.o. male MRN [id number]  Date of birth: 20-Apr-1941  Office Visit Note: Visit Date: 02/01/2018 PCP: [name], MD Referred by: [name], MD  Subjective: Chief Complaint  Patient presents with  . Lower Back - Pain  . Right Leg - Pain  . Left Leg - Pain, Numbness, Weakness  . Tremors  . Fall   HPI: Mr. Luckenbach is a 77 year old gentleman that we recently saw for evaluation well as in the past I have seen him for electrodiagnostic study of the left lower limb.  He comes in today with his wife who was not present for those studies and who has a lot of questions about his care at this point.  The patient is very medically complicated with history of heart stenting and congestive heart failure and more recently a bladder tumor status post transurethral resection.  He has uncontrolled type 2 diabetes.  We had seen him for electrodiagnostic study which was somewhat inconclusive but handed at an L5 and S1 radicular pattern of denervation.  He had extensive surgery debridement of the left lower extremity by Dr. Sharol Given in our office.  He had come in today for planned diagnostic hopefully therapeutic left L5 transforaminal epidural steroid injection.  Upon further questioning today the patient and his wife we really completed a full evaluation again for some new complaints.  He has reported double stumble over his right leg at times and they are concerned about a tremor.  Medications at this point and not really helped his pain control.  He does not report that the tremors or stumbling are any worse when his pain is worse.  He does not necessarily relate the 2.  Continues to have right-sided low back pain which we think is an acute Schmorl's node shown on recent myelogram.  Left leg pain through the knee.  He also reports when he stands at the scene he can have situations where he wants to fall backwards.  Tremor that he reports seems to be an essential tremor.  He does not have  at least by history resting tremor.  This is mainly when he tries to write or get a cup of coffee or do anything particularly in the morning.  He has had no associated blurry vision or other red flag items.  Electrodiagnostic study of the lower limb did suggest underlying peripheral polyneuropathy this was not a full study.    Review of Systems  Constitutional: Negative for chills, fever, malaise/fatigue and weight loss.  HENT: Negative for hearing loss and sinus pain.   Eyes: Negative for blurred vision, double vision and photophobia.  Respiratory: Negative for cough and shortness of breath.   Cardiovascular: Negative for chest pain, palpitations and leg swelling.  Gastrointestinal: Negative for abdominal pain, nausea and vomiting.  Genitourinary: Negative for flank pain.  Musculoskeletal: Positive for back pain, falls and joint pain. Negative for myalgias.  Skin: Negative for itching and rash.  Neurological: Positive for tingling and tremors. Negative for focal weakness and weakness.  Endo/Heme/Allergies: Negative.   Psychiatric/Behavioral: Negative for depression.  All other systems reviewed and are negative.  Otherwise per HPI.  Assessment & Plan: Visit Diagnoses:  1. Lumbar radiculopathy   2. Radiculopathy due to lumbar intervertebral disc disorder   3. Spondylosis without myelopathy or radiculopathy, lumbar region   4. Tremor   5. Balance problem   6. Gait abnormality     Plan: Findings:  Plan today is to carry out  the left L5 section diagnostically and therapeutically as this is been something we have been looking at for quite some time with Dr. Sharol Given.  I do think his right-sided low back pain is from this acute Schmorl's node.  Will resolve on its own time.  Could think about intradiscal injection although I think with his diabetes that would be something that would probably not be a good option.  We could look at facet joint block in that same area from a mechanical standpoint.   Unfortunately his symptoms are really diffuse and is hard to nail down any some underlying depression about his medical condition and that seems to be an issue and he cannot be as active as he used to be.  His situation is worsened by the diabetes as well as being on Xarelto which she has stopped for the procedure.  He will restart the Xarelto tomorrow night.  In terms of his tremor and the history of retro-type instability I think it would be wise for him to consult a movement disorder specialist such as Dr. Wells [facility]iles [facility] at [facility].  Will make the referral.    Meds & Orders:  Meds ordered this encounter  Medications  . betamethasone acetate-betamethasone sodium phosphate (CELESTONE) injection 12 mg    Orders Placed This Encounter  Procedures  . XR C-ARM NO REPORT  . Ambulatory referral to Neurology  . Epidural Steroid injection    Follow-up: Return if symptoms worsen or fail to improve.   Procedures: No procedures performed  Lumbosacral Transforaminal Epidural Steroid Injection - Sub-Pedicular Approach with Fluoroscopic Guidance  Patient: Kelton Pillar      Date of Birth: August 23, 1941 MRN: [id number] PCP: [name], MD      Visit Date: 02/01/2018   Universal Protocol:    Date/Time: 02/01/2018  Consent Given By: the patient  Position: PRONE  Additional Comments: Vital signs were monitored before and after the procedure. Patient was prepped and draped in the usual sterile fashion. The correct patient, procedure, and site was verified.   Injection Procedure Details:  Procedure Site One Meds Administered:  Meds ordered this encounter  Medications  . betamethasone acetate-betamethasone sodium phosphate (CELESTONE) injection 12 mg    Laterality: Left  Location/Site:  L5-S1  Needle size: 22 G  Needle type: Spinal  Needle Placement: Transforaminal  Findings:    -Comments: Excellent flow of contrast along the nerve and into the epidural space.  Procedure  Details: After squaring off the end-plates to get a true AP view, the C-arm was positioned so that an oblique view of the foramen as noted above was visualized. The target area is just inferior to the "nose of the scotty dog" or sub pedicular. The soft tissues overlying this structure were infiltrated with 2-3 ml. of 1% Lidocaine without Epinephrine.  The spinal needle was inserted toward the target using a "trajectory" view along the fluoroscope beam.  Under AP and lateral visualization, the needle was advanced so it did not puncture dura and was located close the 6 O'Clock position of the pedical in AP tracterory. Biplanar projections were used to confirm position. Aspiration was confirmed to be negative for CSF and/or blood. A 1-2 ml. volume of Isovue-250 was injected and flow of contrast was noted at each level. Radiographs were obtained for documentation purposes.   After attaining the desired flow of contrast documented above, a 0.5 to 1.0 ml test dose of 0.25% Marcaine was injected into each respective transforaminal space.  The patient was  observed for 90 seconds post injection.  After no sensory deficits were reported, and normal lower extremity motor function was noted,   the above injectate was administered so that equal amounts of the injectate were placed at each foramen (level) into the transforaminal epidural space.   Additional Comments:  The patient tolerated the procedure well Dressing: Band-Aid    Post-procedure details: Patient was observed during the procedure. Post-procedure instructions were reviewed.  Patient left the clinic in stable condition.    Clinical History: LUMBAR MYELOGRAM  FLUOROSCOPY TIME:  Radiation Exposure Index (as provided by the fluoroscopic device): 320.08 uGy*m2  Fluoroscopy Time:  34 seconds  Number of Acquired Images:  0  PROCEDURE: After thorough discussion of risks and benefits of the procedure including bleeding, infection, injury  to nerves, blood vessels, adjacent structures as well as headache and CSF leak, written and oral informed consent was obtained. Consent was obtained by Dr. San Morelle. Time out form was completed.  Patient was positioned prone on the fluoroscopy table. Local anesthesia was provided with 1% lidocaine without epinephrine after prepped and draped in the usual sterile fashion. Puncture was performed at L2-3 using a 3 1/2 inch 22-gauge spinal needle via left paramedian approach. Using a single pass through the dura, the needle was placed within the thecal sac, with return of clear CSF. 15 mL of Isovue M-200 was injected into the thecal sac, with normal opacification of the nerve roots and cauda equina consistent with free flow within the subarachnoid space.  I personally performed the lumbar puncture and administered the intrathecal contrast. I also personally supervised acquisition of the myelogram images.  TECHNIQUE: Contiguous axial images were obtained through the Lumbar spine after the intrathecal infusion of infusion. Coronal and sagittal reconstructions were obtained of the axial image sets.  COMPARISON:  CT of the abdomen and pelvis 08/24/2017  FINDINGS: LUMBAR MYELOGRAM FINDINGS:  Non rib-bearing lumbar type vertebral bodies are present. Atherosclerotic calcifications present within the aorta.  Slight retrolisthesis is present at L1-2. There is minimal retrolisthesis at L3-4 and slight anterolisthesis at L4-5. Vertebral body heights are maintained.  Mild subarticular narrowing is present bilaterally at L3-4. L3-4 disc protrusion is exaggerated with standing. There is also exaggeration of the L4-5 disc protrusion. Alignment is maintained with standing. There is no change with flexion or extension. Chronic loss of disc height is noted at L5-S1.  CT LUMBAR MYELOGRAM FINDINGS:  The lumbar spine is imaged from the midbody of T12 through  S2-3. Atherosclerotic calcifications are present in the aorta without aneurysm. Limited imaging the abdomen is otherwise unremarkable. There is no significant adenopathy.  A prominent superior endplate Schmorl's node is again noted on the right at L4. The Boney margin is indistinct, suggesting ongoing inflammatory changes. This is similar to the previous appearance.  Endplate degenerative changes are present at L5-S1 with chronic loss of disc height. No other focal lesions are present.  Slight retrolisthesis is again noted at L1-2 and L3-4. There is slight anterolisthesis at L4-5.  T12-L1: Asymmetric right-sided facet hypertrophy is present. There is no significant stenosis.  L1-2: Asymmetric right-sided facet hypertrophy is present. There is no significant central canal stenosis. The disc extends into the right neural foramen. Mild right foraminal narrowing is present.  L2-3: Mild disc bulging and bilateral facet hypertrophy is present. There is no significant stenosis.  L3-4: A broad-based disc protrusion is present. Mild bilateral facet hypertrophy and ligamentum flavum thickening is present. Disc extends into both neural foramina. There is no  significant stenosis.  L4-5: Advanced facet hypertrophy and ligamentum flavum thickening is present. There is a mild broad-based disc protrusion. No focal stenosis is present.  L5-S1: Asymmetric advanced right-sided facet hypertrophy is present. There is chronic loss of disc height with fusion across the disc space. Endplate spurring contributes mild left foraminal narrowing. Facet spurring contributes to mild right foraminal narrowing.  IMPRESSION: LUMBAR MYELOGRAM IMPRESSION:  1. Disc protrusions at L3-4 and L4-5 are significantly worse with standing. These likely contributes to central canal narrowing. 2. Osseous foraminal narrowing at L5-S1 with chronic loss of disc height and fusion across the disc space. 3. Mild  right foraminal narrowing at L1-2 secondary to a far right lateral disc protrusion. 4. Prominent right-sided superior endplate Schmorl's node at L4. This appears to be acutely inflamed. 5.  Aortic Atherosclerosis (ICD10-I70.0).  CT LUMBAR MYELOGRAM IMPRESSION:   Electronically Signed   By: San Morelle M.D.   On: 01/04/2018 11:08  He reports that he quit smoking about 28 years ago. His smoking use included cigarettes. He has a 6.00 pack-year smoking history. he has never used smokeless tobacco.  Recent Labs    06/20/17 1007 10/27/17 0947 11/09/17 1032  HGBA1C 9.0* 10.3* 11.3    Objective:  VS:  HT:    WT:   BMI:     BP:125/72  HR:61bpm  TEMP:98.2 F (36.8 C)(Oral)  RESP:95 % Physical Exam  Constitutional: He is oriented to person, place, and time. He appears well-developed and well-nourished. No distress.  HENT:  Head: Normocephalic and atraumatic.  Nose: Nose normal.  Mouth/Throat: Oropharynx is clear and moist.  Eyes: Conjunctivae are normal. Pupils are equal, round, and reactive to light.  Neck: Normal range of motion. Neck supple. No tracheal deviation present.  Cardiovascular: Regular rhythm and intact distal pulses.  Pulmonary/Chest: Effort normal and breath sounds normal. No respiratory distress.  Abdominal: Soft. He exhibits no distension. There is no rebound and no guarding.  Musculoskeletal: He exhibits no deformity.  Patient ambulates with a cane is very slow to rise from a seated position.  He does ambulate with an antalgic gait to the left.  He has some decreased strength in the left lower extremity.  Neurological: He is alert and oriented to person, place, and time. He displays abnormal reflex. A sensory deficit is present. No cranial nerve deficit. He exhibits normal muscle tone. Coordination normal.  No specific cogwheeling noted  Skin: Skin is warm and dry. No rash noted. No erythema.  Psychiatric: He has a normal mood and affect. His behavior  is normal.  Nursing note and vitals reviewed.   Ortho Exam Imaging: Xr C-arm No Report  Result Date: 02/01/2018 Please see Notes or Procedures tab for imaging impression.   Past Medical/Family/Surgical/Social History: Medications & Allergies reviewed per EMR Patient Active Problem List   Diagnosis Date Noted  . Bladder cancer ([place]) 11/09/2017  . Low back pain radiating to left lower extremity 09/21/2017  . Foot drop, left 08/24/2017  . Diabetes mellitus ([place])   . ICD (implantable cardioverter-defibrillator) discharge 07/05/2017  . Syncope and collapse   . Ventricular tachycardia ([place]) 06/18/2017  . Paroxysmal atrial fibrillation ([place]) 06/10/2017  . Hypoxemia   . VT (ventricular tachycardia) ([place])   . Cardiac arrest ([place]) 05/22/2017  . Defibrillator discharge 02/11/2017  . Atrial fibrillation with rapid ventricular response ([place]) 01/16/2017  . Coronary stent occlusion 11/18/2016  . Type 2 diabetes mellitus, uncontrolled ([place]) 09/30/2016  . CKD (chronic kidney disease) stage 3, GFR 30-59 ml/min (  [place]) 09/30/2016  . CAD (coronary artery disease) 11/20/2015  . Leucocytoclastic vasculitis ([place]) 06/25/2012  . Staphylococcus aureus bacteremia with sepsis ([place]) 06/18/2012  . NSTEMI, initial episode of care ([place]) 06/16/2012  . Cellulitis and abscess of lower leg 06/14/2012    Class: Acute  . Healthcare-associated pneumonia 06/14/2012    Class: Acute  . Hyperglycemia 06/14/2012    Class: Acute  . Leg swelling 04/22/2012  . Urethral stricture 04/15/2012  . Gross hematuria 03/22/2012  . Hematochezia 03/22/2012  . Obesity 01/08/2012  . ED (erectile dysfunction) of organic origin 09/11/2011  . OSA (obstructive sleep apnea) 09/02/2011  . Systolic CHF, chronic ([place]) 06/10/2011  . HEMATOCHEZIA 02/10/2011  . BURSITIS, LEFT HIP 02/10/2011  . CORONARY ATHEROSCLEROSIS NATIVE CORONARY ARTERY 01/27/2011  . CARDIOMYOPATHY 01/21/2011  . CHEST PAIN UNSPECIFIED 01/20/2011  . DYSPNEA  01/16/2011  . ABDOMINAL PAIN, UNSPECIFIED SITE 01/13/2011  . ECZEMA 01/28/2010  . BRONCHITIS, ACUTE WITH MILD BRONCHOSPASM 12/21/2009  . HERPES ZOSTER OPHTHALMICUS 07/19/2009  . ADENOCARCINOMA, PROSTATE 06/20/2009  . Secondary DM with peripheral vascular disease, uncontrolled ([place]) 06/20/2009  . Hyperlipidemia 06/20/2009  . Essential hypertension 06/20/2009  . HYPERTROPHY PROSTATE W/UR OBST & OTH LUTS 06/20/2009   Past Medical History:  Diagnosis Date  . Adenocarcinoma of prostate ([place])    s/p seed implants  . Arthritis   . Cellulitis of left leg    a. 05/2702 complicated by septic shock  . Chronic combined systolic and diastolic CHF (congestive heart failure) ([place])   . Colon polyps   . CORONARY ATHEROSCLEROSIS NATIVE CORONARY ARTERY    a. 01/2011 Cath/PCI: LM nl, LAD 40-50p, D1 80-small, LCX 95-small, RI 90, RCA 100, EF 20%;  b. 01/2011 Card MRI - No transmural scar;  c. 01/2011 PCI RCA->5 Promus DES, RI->3.0x16 Promus DES; d. Cath 01/13/13 patent LAD & Ramus, diffuse LCx dz, RCA mult overlapping stents w/ 95% osital stenosis, EF 20%, s/p DES to ostial/prox RCA 01/24/13   . Diabetes mellitus, type 2 ([place])   . GERD (gastroesophageal reflux disease)   . Hematoma of leg    a. left leg hematoma 03/2012 in the setting of asa/effient  . Herpes zoster ophthalmicus   . HYPERLIPIDEMIA    intolerant to Lipitor (myalgias)  . HYPERTENSION   . Ischemic cardiomyopathy    a. 01/2012 Echo EF 45%, mild LVH; b. JK09%, grade 1 diastolic dysfunction, diffuse hypokinesis, inferoposterior akinesis   . Noncompliance   . Obesity   . OSA (obstructive sleep apnea)   . Polymyalgia rheumatica (H[facility])    Family History  Problem Relation Age of Onset  . Cancer Mother 6       unknown CA  . Heart disease Father   . Alcohol abuse Father   . Heart attack Father 39   Past Surgical History:  Procedure Laterality Date  . CARDIAC CATHETERIZATION  01/24/2013  . CARDIAC CATHETERIZATION N/A 11/14/2016   Procedure:  Right/Left Heart Cath and Coronary Angiography;  Surgeon: Larey Dresser, MD;  Location: [place] [facility];  Service: Cardiovascular;  Laterality: N/A;  . CARDIAC CATHETERIZATION N/A 11/17/2016   Procedure: Coronary Stent Intervention w/Impella;  Surgeon: Peter M Martinique, MD;  Location: [place] [facility];  Service: Cardiovascular;  Laterality: N/A;  . CARDIAC CATHETERIZATION N/A 11/17/2016   Procedure: Coronary Atherectomy;  Surgeon: Peter M Martinique, MD;  Location: [place] [facility];  Service: Cardiovascular;  Laterality: N/A;  . CORONARY ANGIOPLASTY WITH STENT PLACEMENT  01/24/2013   DES to RCA  .  CORONARY BALLOON ANGIOPLASTY N/A 07/06/2017   Procedure: Coronary Balloon Angioplasty;  Surgeon: Sherren Mocha, MD;  Location: [place] [facility];  Service: Cardiovascular;  Laterality: N/A;  . CORONARY STENT PLACEMENT  2012   reports 6 stents placed  . I&D EXTREMITY  06/15/2012   Procedure: IRRIGATION AND DEBRIDEMENT EXTREMITY;  Surgeon: Newt Minion, MD;  Location: [facility];  Service: Orthopedics;  Laterality: Left;  I&D Left Posterior Knee  . I&D EXTREMITY  06/30/2012   Procedure: IRRIGATION AND DEBRIDEMENT EXTREMITY;  Surgeon: Newt Minion, MD;  Location: [facility];  Service: Orthopedics;  Laterality: Left;  Left Leg Irrigation and Debridement and placement of Wound VAC and application of  A-cell  . I&D EXTREMITY  07/20/2012   Procedure: IRRIGATION AND DEBRIDEMENT EXTREMITY;  Surgeon: Newt Minion, MD;  Location: [facility];  Service: Orthopedics;  Laterality: Left;  Irrigation and Debridement Left Leg and Place antibiotic beads   . ICD IMPLANT N/A 02/12/2017   Procedure: ICD Implant;  Surgeon: Evans Lance, MD;  Location: [place] [facility];  Service: Cardiovascular;  Laterality: N/A;  . LEAD REVISION/REPAIR N/A 05/26/2017   Procedure: Lead Revision/Repair;  Surgeon: Evans Lance, MD;  Location: [place] [facility];  Service: Cardiovascular;  Laterality: N/A;  . LEFT HEART CATH AND CORONARY ANGIOGRAPHY N/A  07/06/2017   Procedure: Left Heart Cath and Coronary Angiography;  Surgeon: Larey Dresser, MD;  Location: [facility];  Service: Cardiovascular;  Laterality: N/A;  . PERCUTANEOUS CORONARY STENT INTERVENTION (PCI-S) N/A 01/24/2013   Procedure: PERCUTANEOUS CORONARY STENT INTERVENTION (PCI-S);  Surgeon: Wellington Hampshire, MD;  Location: [facility];  Service: Cardiovascular;  Laterality: N/A;  . PROSTATE SURGERY     cancer, seed implant  . TRANSURETHRAL RESECTION OF BLADDER TUMOR N/A 10/30/2017   Procedure: TRANSURETHRAL RESECTION OF BLADDER TUMOR (TURBT)/ INSTILLATION OF EPIRUBICIN;  Surgeon: Festus Aloe, MD;  Location: [facility];  Service: Urology;  Laterality: N/A;  . ULTRASOUND GUIDANCE FOR VASCULAR ACCESS  11/14/2016   Procedure: Ultrasound Guidance For Vascular Access;  Surgeon: Larey Dresser, MD;  Location: [facility] [facility];  Service: Cardiovascular;;  . Stephanie Coup ABLATION N/A 06/19/2017   Procedure: Stephanie Coup Ablation;  Surgeon: Evans Lance, MD;  Location: [facility];  Service: Cardiovascular;  Laterality: N/A;   Social History   Occupational History  . Occupation: RETIRED    Employer: RETIRED    Comment: TRUCK DRIVER  Tobacco Use  . Smoking status: Former Smoker    Packs/day: 0.30    Years: 20.00    Pack years: 6.00    Types: Cigarettes    Last attempt to quit: 02/23/1989    Years since quitting: 28.9  . Smokeless tobacco: Never Used  Substance and Sexual Activity  . Alcohol use: No  . Drug use: No  . Sexual activity: Not on file

## 2018-02-02 NOTE — Progress Notes (Signed)
Wayne [name] - 77 y.o. male MRN [id number]  Date of birth: 10-28-1941  Office Visit Note: Visit Date: 01/20/2018 PCP: [name], MD Referred by: [name], MD  Subjective: Chief Complaint  Patient presents with  . Lower Back - Pain  . Left Lower Leg - Pain, Numbness, Weakness  . Right Hip - Pain  . 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  Electrodiagnostic study was performed by myself which was fairly inconclusive because of the patient's history of uncontrolled diabetes as well as prior surgery of the left lower extremity.  This did suggest possible radicular denervation pattern and underlying polyneuropathy.  The patient has gone on to have lumbar myelogram performed.  In the interim the patient has been diagnosed with bladder cancer and has had transurethral resection.  Complicated medical history with coronary artery disease and congestive heart failure with stenting.  Uncontrolled diabetes.  He has had a history of lower extremity vasculitis and ulcers and nonhealing wounds.  He has had Doppler studies that still show he has pretty good blood flow of the legs.  He comes in today for review of his myelogram which was performed.  Continues to have mostly right-sided low back pain worse with standing and ambulating and this is something that is been ongoing but fairly recently worsened.  He has left somewhat radicular type pain and weakness of the left leg.  Pain goes into the left knee.  He has had prior knee problems however.  He has had no new symptoms otherwise from a musculoskeletal complaint.  He does talk about balance difficulties with somewhat of a issue with retro-type balance problems with wanting to fall backwards.  Walking and standing seem to increase his pain.  He is limited how far he can walk he reports from  sit to stand he is usually okay when he first gets up but after he walks for a while he gets increasing symptoms and more of a neurogenic claudication type symptom.  He has had physical therapy and medication management.  Is not had any interventional spine procedures or prior lumbar surgery.    Review of Systems  Constitutional: Positive for malaise/fatigue. Negative for chills, fever and weight loss.  HENT: Negative for hearing loss and sinus pain.   Eyes: Negative for blurred vision, double vision and photophobia.  Respiratory: Negative for cough and shortness of breath.   Cardiovascular: Negative for chest pain, palpitations and leg swelling.  Gastrointestinal: Negative for abdominal pain, nausea and vomiting.  Genitourinary: Negative for flank pain.  Musculoskeletal: Positive for back pain and joint pain. Negative for myalgias.  Skin: Negative for itching and rash.  Neurological: Positive for tingling and focal weakness. Negative for tremors and weakness.  Endo/Heme/Allergies: Negative.   Psychiatric/Behavioral: Positive for depression.  All other systems reviewed and are negative.  Otherwise per HPI.  Assessment & Plan: Visit Diagnoses:  1. Chronic right-sided low back pain without sciatica   2. Spondylosis without myelopathy or radiculopathy, lumbar region   3. Radiculopathy due to lumbar intervertebral disc disorder   4. Lumbar radiculopathy   5. Gait abnormality   6. Balance disorder     Plan: Findings:  Complicated medically from a diagnostic standpoint at least to what is causing a lot of his symptoms overall.  I do think there  is a simple explanation for the right-sided low back pain and this is the acute Schmorl's node at L4 superior endplate on the myelogram.  There is a pretty impressive finding when you look at the pictures and I did show this to him.  These typically will resolve.  It is sort of like having a disc herniation into the endplate or similarly to a  compression fracture.  I do think that will heal.  In someone without his medical conditions we could look at intradiscal type procedures but I do not think that would be wise given his diabetes.  Could look at facet joint block in the same area from a mechanical standpoint for his right-sided low back pain.  He says that is really not his biggest issue pain in the leg and weakness.  We discussed the injections would not help with the weakness.  He does get numbness in the left leg.  I think the next approach is a left L5 diagnostic injection.  She would have to get him off of his blood thinner if possible.  He does take Xarelto.  Greater than 50% of this visit (total duration of visit 30 min) was spent in counseling and coordination of care discussing    Meds & Orders: No orders of the defined types were placed in this encounter.  No orders of the defined types were placed in this encounter.   Follow-up: Return for Left L5 transforaminal epidural steroid injection..   Procedures: No procedures performed  No notes on file   Clinical History: LUMBAR MYELOGRAM  FLUOROSCOPY TIME:  Radiation Exposure Index (as provided by the fluoroscopic device): 320.08 uGy*m2  Fluoroscopy Time:  34 seconds  Number of Acquired Images:  0  PROCEDURE: After thorough discussion of risks and benefits of the procedure including bleeding, infection, injury to nerves, blood vessels, adjacent structures as well as headache and CSF leak, written and oral informed consent was obtained. Consent was obtained by Dr. San Morelle. Time out form was completed.  Patient was positioned prone on the fluoroscopy table. Local anesthesia was provided with 1% lidocaine without epinephrine after prepped and draped in the usual sterile fashion. Puncture was performed at L2-3 using a 3 1/2 inch 22-gauge spinal needle via left paramedian approach. Using a single pass through the dura, the needle was placed within  the thecal sac, with return of clear CSF. 15 mL of Isovue M-200 was injected into the thecal sac, with normal opacification of the nerve roots and cauda equina consistent with free flow within the subarachnoid space.  I personally performed the lumbar puncture and administered the intrathecal contrast. I also personally supervised acquisition of the myelogram images.  TECHNIQUE: Contiguous axial images were obtained through the Lumbar spine after the intrathecal infusion of infusion. Coronal and sagittal reconstructions were obtained of the axial image sets.  COMPARISON:  CT of the abdomen and pelvis 08/24/2017  FINDINGS: LUMBAR MYELOGRAM FINDINGS:  Non rib-bearing lumbar type vertebral bodies are present. Atherosclerotic calcifications present within the aorta.  Slight retrolisthesis is present at L1-2. There is minimal retrolisthesis at L3-4 and slight anterolisthesis at L4-5. Vertebral body heights are maintained.  Mild subarticular narrowing is present bilaterally at L3-4. L3-4 disc protrusion is exaggerated with standing. There is also exaggeration of the L4-5 disc protrusion. Alignment is maintained with standing. There is no change with flexion or extension. Chronic loss of disc height is noted at L5-S1.  CT LUMBAR MYELOGRAM FINDINGS:  The lumbar spine is imaged from  the midbody of T12 through S2-3. Atherosclerotic calcifications are present in the aorta without aneurysm. Limited imaging the abdomen is otherwise unremarkable. There is no significant adenopathy.  A prominent superior endplate Schmorl's node is again noted on the right at L4. The Boney margin is indistinct, suggesting ongoing inflammatory changes. This is similar to the previous appearance.  Endplate degenerative changes are present at L5-S1 with chronic loss of disc height. No other focal lesions are present.  Slight retrolisthesis is again noted at L1-2 and L3-4. There is slight  anterolisthesis at L4-5.  T12-L1: Asymmetric right-sided facet hypertrophy is present. There is no significant stenosis.  L1-2: Asymmetric right-sided facet hypertrophy is present. There is no significant central canal stenosis. The disc extends into the right neural foramen. Mild right foraminal narrowing is present.  L2-3: Mild disc bulging and bilateral facet hypertrophy is present. There is no significant stenosis.  L3-4: A broad-based disc protrusion is present. Mild bilateral facet hypertrophy and ligamentum flavum thickening is present. Disc extends into both neural foramina. There is no significant stenosis.  L4-5: Advanced facet hypertrophy and ligamentum flavum thickening is present. There is a mild broad-based disc protrusion. No focal stenosis is present.  L5-S1: Asymmetric advanced right-sided facet hypertrophy is present. There is chronic loss of disc height with fusion across the disc space. Endplate spurring contributes mild left foraminal narrowing. Facet spurring contributes to mild right foraminal narrowing.  IMPRESSION: LUMBAR MYELOGRAM IMPRESSION:  1. Disc protrusions at L3-4 and L4-5 are significantly worse with standing. These likely contributes to central canal narrowing. 2. Osseous foraminal narrowing at L5-S1 with chronic loss of disc height and fusion across the disc space. 3. Mild right foraminal narrowing at L1-2 secondary to a far right lateral disc protrusion. 4. Prominent right-sided superior endplate Schmorl's node at L4. This appears to be acutely inflamed. 5.  Aortic Atherosclerosis (ICD10-I70.0).  CT LUMBAR MYELOGRAM IMPRESSION:   Electronically Signed   By: San Morelle M.D.   On: 01/04/2018 11:08  He reports that he quit smoking about 28 years ago. His smoking use included cigarettes. He has a 6.00 pack-year smoking history. he has never used smokeless tobacco.  Recent Labs    06/20/17 1007 10/27/17 0947  11/09/17 1032  HGBA1C 9.0* 10.3* 11.3    Objective:  VS:  HT:    WT:   BMI:     BP:132/65  HR:62bpm  TEMP: ( )  RESP:  Physical Exam  Constitutional: He is oriented to person, place, and time. He appears well-developed and well-nourished. No distress.  HENT:  Head: Normocephalic and atraumatic.  Nose: Nose normal.  Mouth/Throat: Oropharynx is clear and moist.  Eyes: Conjunctivae are normal. Pupils are equal, round, and reactive to light.  Neck: Normal range of motion. Neck supple. No tracheal deviation present.  Cardiovascular: Regular rhythm and intact distal pulses.  Pulmonary/Chest: Effort normal and breath sounds normal.  Abdominal: Soft. He exhibits no distension. There is no rebound and no guarding.  Musculoskeletal: He exhibits no deformity.  Patient ambulates slowly with a cane.  He does have weakness of the left foot in dorsiflexion.  He has had extensive surgery to the left lower extremity.  He has no pain with hip rotation.  He does have pain with extension rotation of the lumbar spine to the right more than left.  Neurological: He is alert and oriented to person, place, and time. A sensory deficit is present. No cranial nerve deficit. He exhibits normal muscle tone. Coordination normal.  No  tremor noted, negative Romberg.  Negative pronator drift  Skin: Skin is warm. No rash noted.  Psychiatric: He has a normal mood and affect. His behavior is normal.  Nursing note and vitals reviewed.   Ortho Exam Imaging: Xr C-arm No Report  Result Date: 02/01/2018 Please see Notes or Procedures tab for imaging impression.   Past Medical/Family/Surgical/Social History: Medications & Allergies reviewed per EMR Patient Active Problem List   Diagnosis Date Noted  . Bladder cancer ([place]) 11/09/2017  . Low back pain radiating to left lower extremity 09/21/2017  . Foot drop, left 08/24/2017  . Diabetes mellitus ([place])   . ICD (implantable cardioverter-defibrillator) discharge  07/05/2017  . Syncope and collapse   . Ventricular tachycardia ([place]) 06/18/2017  . Paroxysmal atrial fibrillation ([place]) 06/10/2017  . Hypoxemia   . VT (ventricular tachycardia) ([place])   . Cardiac arrest ([place]) 05/22/2017  . Defibrillator discharge 02/11/2017  . Atrial fibrillation with rapid ventricular response ([place]) 01/16/2017  . Coronary stent occlusion 11/18/2016  . Type 2 diabetes mellitus, uncontrolled ([place]) 09/30/2016  . CKD (chronic kidney disease) stage 3, GFR 30-59 ml/min (H[facility]) 09/30/2016  . CAD (coronary artery disease) 11/20/2015  . Leucocytoclastic vasculitis ([place]) 06/25/2012  . Staphylococcus aureus bacteremia with sepsis ([place]) 06/18/2012  . NSTEMI, initial episode of care ([place]) 06/16/2012  . Cellulitis and abscess of lower leg 06/14/2012    Class: Acute  . Healthcare-associated pneumonia 06/14/2012    Class: Acute  . Hyperglycemia 06/14/2012    Class: Acute  . Leg swelling 04/22/2012  . Urethral stricture 04/15/2012  . Gross hematuria 03/22/2012  . Hematochezia 03/22/2012  . Obesity 01/08/2012  . ED (erectile dysfunction) of organic origin 09/11/2011  . OSA (obstructive sleep apnea) 09/02/2011  . Systolic CHF, chronic ([place]) 06/10/2011  . HEMATOCHEZIA 02/10/2011  . BURSITIS, LEFT HIP 02/10/2011  . CORONARY ATHEROSCLEROSIS NATIVE CORONARY ARTERY 01/27/2011  . CARDIOMYOPATHY 01/21/2011  . CHEST PAIN UNSPECIFIED 01/20/2011  . DYSPNEA 01/16/2011  . ABDOMINAL PAIN, UNSPECIFIED SITE 01/13/2011  . ECZEMA 01/28/2010  . BRONCHITIS, ACUTE WITH MILD BRONCHOSPASM 12/21/2009  . HERPES ZOSTER OPHTHALMICUS 07/19/2009  . ADENOCARCINOMA, PROSTATE 06/20/2009  . Secondary DM with peripheral vascular disease, uncontrolled ([place]) 06/20/2009  . Hyperlipidemia 06/20/2009  . Essential hypertension 06/20/2009  . HYPERTROPHY PROSTATE W/UR OBST & OTH LUTS 06/20/2009   Past Medical History:  Diagnosis Date  . Adenocarcinoma of prostate ([place])    s/p seed implants  . Arthritis   .  Cellulitis of left leg    a. 02/6143 complicated by septic shock  . Chronic combined systolic and diastolic CHF (congestive heart failure) ([place])   . Colon polyps   . CORONARY ATHEROSCLEROSIS NATIVE CORONARY ARTERY    a. 01/2011 Cath/PCI: LM nl, LAD 40-50p, D1 80-small, LCX 95-small, RI 90, RCA 100, EF 20%;  b. 01/2011 Card MRI - No transmural scar;  c. 01/2011 PCI RCA->5 Promus DES, RI->3.0x16 Promus DES; d. Cath 01/13/13 patent LAD & Ramus, diffuse LCx dz, RCA mult overlapping stents w/ 95% osital stenosis, EF 20%, s/p DES to ostial/prox RCA 01/24/13   . Diabetes mellitus, type 2 ([place])   . GERD (gastroesophageal reflux disease)   . Hematoma of leg    a. left leg hematoma 03/2012 in the setting of asa/effient  . Herpes zoster ophthalmicus   . HYPERLIPIDEMIA    intolerant to Lipitor (myalgias)  . HYPERTENSION   . Ischemic cardiomyopathy    a. 01/2012 Echo EF 45%, mild LVH; b. RX54%, grade 1 diastolic dysfunction, diffuse  hypokinesis, inferoposterior akinesis   . Noncompliance   . Obesity   . OSA (obstructive sleep apnea)   . Polymyalgia rheumatica (HCC)    Family History  Problem Relation Age of Onset  . Cancer Mother 59       unknown CA  . Heart disease Father   . Alcohol abuse Father   . Heart attack Father 62   Past Surgical History:  Procedure Laterality Date  . CARDIAC CATHETERIZATION  01/24/2013  . CARDIAC CATHETERIZATION N/A 11/14/2016   Procedure: Right/Left Heart Cath and Coronary Angiography;  Surgeon: Larey Dresser, MD;  Location: [facility];  Service: Cardiovascular;  Laterality: N/A;  . CARDIAC CATHETERIZATION N/A 11/17/2016   Procedure: Coronary Stent Intervention w/Impella;  Surgeon: Peter M Martinique, MD;  Location: [facility];  Service: Cardiovascular;  Laterality: N/A;  . CARDIAC CATHETERIZATION N/A 11/17/2016   Procedure: Coronary Atherectomy;  Surgeon: Peter M Martinique, MD;  Location: [facility];  Service: Cardiovascular;  Laterality: N/A;  .  CORONARY ANGIOPLASTY WITH STENT PLACEMENT  01/24/2013   DES to RCA  . CORONARY BALLOON ANGIOPLASTY N/A 07/06/2017   Procedure: Coronary Balloon Angioplasty;  Surgeon: Sherren Mocha, MD;  Location: [facility];  Service: Cardiovascular;  Laterality: N/A;  . CORONARY STENT PLACEMENT  2012   reports 6 stents placed  . I&D EXTREMITY  06/15/2012   Procedure: IRRIGATION AND DEBRIDEMENT EXTREMITY;  Surgeon: Newt Minion, MD;  Location: [facility];  Service: Orthopedics;  Laterality: Left;  I&D Left Posterior Knee  . I&D EXTREMITY  06/30/2012   Procedure: IRRIGATION AND DEBRIDEMENT EXTREMITY;  Surgeon: Newt Minion, MD;  Location: [facility];  Service: Orthopedics;  Laterality: Left;  Left Leg Irrigation and Debridement and placement of Wound VAC and application of  A-cell  . I&D EXTREMITY  07/20/2012   Procedure: IRRIGATION AND DEBRIDEMENT EXTREMITY;  Surgeon: Newt Minion, MD;  Location: [facility];  Service: Orthopedics;  Laterality: Left;  Irrigation and Debridement Left Leg and Place antibiotic beads   . ICD IMPLANT N/A 02/12/2017   Procedure: ICD Implant;  Surgeon: Evans Lance, MD;  Location: [facility];  Service: Cardiovascular;  Laterality: N/A;  . LEAD REVISION/REPAIR N/A 05/26/2017   Procedure: Lead Revision/Repair;  Surgeon: Evans Lance, MD;  Location: [facility];  Service: Cardiovascular;  Laterality: N/A;  . LEFT HEART CATH AND CORONARY ANGIOGRAPHY N/A 07/06/2017   Procedure: Left Heart Cath and Coronary Angiography;  Surgeon: Larey Dresser, MD;  Location: [facility];  Service: Cardiovascular;  Laterality: N/A;  . PERCUTANEOUS CORONARY STENT INTERVENTION (PCI-S) N/A 01/24/2013   Procedure: PERCUTANEOUS CORONARY STENT INTERVENTION (PCI-S);  Surgeon: Wellington Hampshire, MD;  Location: [facility];  Service: Cardiovascular;  Laterality: N/A;  . PROSTATE SURGERY     cancer, seed implant  . TRANSURETHRAL RESECTION OF BLADDER TUMOR N/A 10/30/2017   Procedure: TRANSURETHRAL  RESECTION OF BLADDER TUMOR (TURBT)/ INSTILLATION OF EPIRUBICIN;  Surgeon: Festus Aloe, MD;  Location: [facility];  Service: Urology;  Laterality: N/A;  . ULTRASOUND GUIDANCE FOR VASCULAR ACCESS  11/14/2016   Procedure: Ultrasound Guidance For Vascular Access;  Surgeon: Larey Dresser, MD;  Location: [facility];  Service: Cardiovascular;;  . Stephanie Coup ABLATION N/A 06/19/2017   Procedure: Stephanie Coup Ablation;  Surgeon: Evans Lance, MD;  Location: [facility];  Service: Cardiovascular;  Laterality: N/A;   Social History   Occupational History  .  Occupation: RETIRED    Employer: RETIRED    Comment: TRUCK DRIVER  Tobacco Use  . Smoking status: Former Smoker    Packs/day: 0.30    Years: 20.00    Pack years: 6.00    Types: Cigarettes    Last attempt to quit: 02/23/1989    Years since quitting: 28.9  . Smokeless tobacco: Never Used  Substance and Sexual Activity  . Alcohol use: No  . Drug use: No  . Sexual activity: Not on file

## 2018-02-03 ENCOUNTER — Encounter: Payer: Self-pay | Admitting: Neurology

## 2018-02-09 ENCOUNTER — Ambulatory Visit (INDEPENDENT_AMBULATORY_CARE_PROVIDER_SITE_OTHER): Payer: Medicare Other | Admitting: Family Medicine

## 2018-02-09 ENCOUNTER — Encounter: Payer: Self-pay | Admitting: Family Medicine

## 2018-02-09 VITALS — BP 110/64 | HR 53 | Temp 98.2°F | Wt 235.9 lb

## 2018-02-09 DIAGNOSIS — E11649 Type 2 diabetes mellitus with hypoglycemia without coma: Secondary | ICD-10-CM

## 2018-02-09 DIAGNOSIS — R05 Cough: Secondary | ICD-10-CM

## 2018-02-09 DIAGNOSIS — R059 Cough, unspecified: Secondary | ICD-10-CM

## 2018-02-09 LAB — POCT GLYCOSYLATED HEMOGLOBIN (HGB A1C): HEMOGLOBIN A1C: 10.7

## 2018-02-09 MED ORDER — DOXYCYCLINE HYCLATE 100 MG PO CAPS: 100.0000 mg | ORAL_CAPSULE | Freq: Two times a day (BID) | ORAL | 0 refills | Status: DC

## 2018-02-09 NOTE — Progress Notes (Signed)
Subjective:     Patient ID: Wayne [name], male   DOB: 1941/07/29, 77 y.o.   MRN: [id number]  HPI Acute issue of productive cough for over week. He denies any hemoptysis. No fevers or chills. No increased dyspnea above baseline. Cough productive of green sputum. He feels he may be wheezing some off and on  Type 2 diabetes. History of poor control and poor compliance. Recently increased Amaryl to 4 mg daily. His A1c is unfortunately up to 10.7% today. Very poor compliance with diet. He declines diabetes educator at this time. He has seen in the past. He states he lots of bread and high carb foods. He is not a candidate for several medications because of GFR of 48. He also has history of severe systolic dysfunction  Past Medical History:  Diagnosis Date  . Adenocarcinoma of prostate ([place])    s/p seed implants  . Arthritis   . Cellulitis of left leg    a. 01/5955 complicated by septic shock  . Chronic combined systolic and diastolic CHF (congestive heart failure) ([place])   . Colon polyps   . CORONARY ATHEROSCLEROSIS NATIVE CORONARY ARTERY    a. 01/2011 Cath/PCI: LM nl, LAD 40-50p, D1 80-small, LCX 95-small, RI 90, RCA 100, EF 20%;  b. 01/2011 Card MRI - No transmural scar;  c. 01/2011 PCI RCA->5 Promus DES, RI->3.0x16 Promus DES; d. Cath 01/13/13 patent LAD & Ramus, diffuse LCx dz, RCA mult overlapping stents w/ 95% osital stenosis, EF 20%, s/p DES to ostial/prox RCA 01/24/13   . Diabetes mellitus, type 2 ([place])   . GERD (gastroesophageal reflux disease)   . Hematoma of leg    a. left leg hematoma 03/2012 in the setting of asa/effient  . Herpes zoster ophthalmicus   . HYPERLIPIDEMIA    intolerant to Lipitor (myalgias)  . HYPERTENSION   . Ischemic cardiomyopathy    a. 01/2012 Echo EF 45%, mild LVH; b. LO75%, grade 1 diastolic dysfunction, diffuse hypokinesis, inferoposterior akinesis   . Noncompliance   . Obesity   . OSA (obstructive sleep apnea)   . Polymyalgia rheumatica ([facility])    Past Surgical  History:  Procedure Laterality Date  . CARDIAC CATHETERIZATION  01/24/2013  . CARDIAC CATHETERIZATION N/A 11/14/2016   Procedure: Right/Left Heart Cath and Coronary Angiography;  Surgeon: Larey Dresser, MD;  Location: [facility];  Service: Cardiovascular;  Laterality: N/A;  . CARDIAC CATHETERIZATION N/A 11/17/2016   Procedure: Coronary Stent Intervention w/Impella;  Surgeon: Peter M Martinique, MD;  Location: [facility];  Service: Cardiovascular;  Laterality: N/A;  . CARDIAC CATHETERIZATION N/A 11/17/2016   Procedure: Coronary Atherectomy;  Surgeon: Peter M Martinique, MD;  Location: [place] [facility];  Service: Cardiovascular;  Laterality: N/A;  . CORONARY ANGIOPLASTY WITH STENT PLACEMENT  01/24/2013   DES to RCA  . CORONARY BALLOON ANGIOPLASTY N/A 07/06/2017   Procedure: Coronary Balloon Angioplasty;  Surgeon: Sherren Mocha, MD;  Location: [facility];  Service: Cardiovascular;  Laterality: N/A;  . CORONARY STENT PLACEMENT  2012   reports 6 stents placed  . I&D EXTREMITY  06/15/2012   Procedure: IRRIGATION AND DEBRIDEMENT EXTREMITY;  Surgeon: Newt Minion, MD;  Location: [facility] [place];  Service: Orthopedics;  Laterality: Left;  I&D Left Posterior Knee  . I&D EXTREMITY  06/30/2012   Procedure: IRRIGATION AND DEBRIDEMENT EXTREMITY;  Surgeon: Newt Minion, MD;  Location: [facility];  Service: Orthopedics;  Laterality: Left;  Left Leg Irrigation and Debridement and placement of  Wound VAC and application of  A-cell  . I&D EXTREMITY  07/20/2012   Procedure: IRRIGATION AND DEBRIDEMENT EXTREMITY;  Surgeon: Newt Minion, MD;  Location: [facility];  Service: Orthopedics;  Laterality: Left;  Irrigation and Debridement Left Leg and Place antibiotic beads   . ICD IMPLANT N/A 02/12/2017   Procedure: ICD Implant;  Surgeon: Evans Lance, MD;  Location: [facility];  Service: Cardiovascular;  Laterality: N/A;  . LEAD REVISION/REPAIR N/A 05/26/2017   Procedure: Lead Revision/Repair;  Surgeon: Evans Lance, MD;  Location: [place] [facility];  Service: Cardiovascular;  Laterality: N/A;  . LEFT HEART CATH AND CORONARY ANGIOGRAPHY N/A 07/06/2017   Procedure: Left Heart Cath and Coronary Angiography;  Surgeon: Larey Dresser, MD;  Location: [facility];  Service: Cardiovascular;  Laterality: N/A;  . PERCUTANEOUS CORONARY STENT INTERVENTION (PCI-S) N/A 01/24/2013   Procedure: PERCUTANEOUS CORONARY STENT INTERVENTION (PCI-S);  Surgeon: Wellington Hampshire, MD;  Location: [facility];  Service: Cardiovascular;  Laterality: N/A;  . PROSTATE SURGERY     cancer, seed implant  . TRANSURETHRAL RESECTION OF BLADDER TUMOR N/A 10/30/2017   Procedure: TRANSURETHRAL RESECTION OF BLADDER TUMOR (TURBT)/ INSTILLATION OF EPIRUBICIN;  Surgeon: Festus Aloe, MD;  Location: [facility];  Service: Urology;  Laterality: N/A;  . ULTRASOUND GUIDANCE FOR VASCULAR ACCESS  11/14/2016   Procedure: Ultrasound Guidance For Vascular Access;  Surgeon: Larey Dresser, MD;  Location: [place] [facility];  Service: Cardiovascular;;  . Stephanie Coup ABLATION N/A 06/19/2017   Procedure: Stephanie Coup Ablation;  Surgeon: Evans Lance, MD;  Location: [place] [facility];  Service: Cardiovascular;  Laterality: N/A;    reports that he quit smoking about 28 years ago. His smoking use included cigarettes. He has a 6.00 pack-year smoking history. he has never used smokeless tobacco. He reports that he does not drink alcohol or use drugs. family history includes Alcohol abuse in his father; Cancer (age of onset: 36) in his mother; Heart attack (age of onset: 58) in his father; Heart disease in his father. Allergies  Allergen Reactions  . Entresto [Sacubitril-Valsartan] Swelling and Other (See Comments)    Angioedema  . Crestor [Rosuvastatin Calcium] Other (See Comments)    Myalgia, Interfering with Gait  . Lipitor [Atorvastatin] Other (See Comments)    Makes legs sore  . Plavix [Clopidogrel] Itching, Swelling and Other (See Comments)    Nose  bleeds and welts on legs & back  . Ancef [Cefazolin] Itching, Rash and Other (See Comments)    Describes itching and rash, but said that "it wasn't that bad"       Review of Systems  Constitutional: Negative for chills, fatigue and fever.  Eyes: Negative for visual disturbance.  Respiratory: Positive for cough. Negative for chest tightness and shortness of breath.   Cardiovascular: Negative for chest pain and palpitations.  Genitourinary: Negative for dysuria.  Neurological: Negative for dizziness, syncope, weakness, light-headedness and headaches.       Objective:   Physical Exam  Constitutional: He is oriented to person, place, and time. He appears well-developed and well-nourished.  HENT:  Right Ear: External ear normal.  Left Ear: External ear normal.  Mouth/Throat: Oropharynx is clear and moist.  Eyes: Pupils are equal, round, and reactive to light.  Neck: Neck supple. No thyromegaly present.  Cardiovascular: Normal rate and regular rhythm.  Pulmonary/Chest: Effort normal and breath sounds normal. No respiratory distress. He has no wheezes. He has no rales.  Musculoskeletal: He exhibits no edema.  Neurological: He is alert and oriented to person, place, and time.       Assessment:     #1 acute bronchitis. May be viral but patient has high risk of complication  #2 very poorly controlled type 2 diabetes with A1c today at 10.7%. Poor compliance with diet    Plan:     -Start doxycycline 100 mg twice daily for 10 days -Offered reinforcement with diabetic education but he declines at this time -start [place] 10 units once daily and check daily fasting blood sugars and titrate up 2 units every 3 days until fasting blood sugars around 120- 130 -Patient was instructed on how to administer tresiba and was given samples -Follow-up promptly for any fever or increased shortness of breath otherwise we'll plan follow-up in 3 months to reassess  [name] MD [place]  Primary Care at [facility]

## 2018-02-09 NOTE — Progress Notes (Signed)
Medication Samples have been provided to the patient Drug name: Tyler Aas      Strength: 100 u        Qty: 2*  LOT: [id number]  Exp.Date: 10/2018  Dosing instructions: 10 units daily.  Check fasting sugars daily and increase the Tresiba by 2 units every 3 days until fasting sugars are mostly < 130.  The patient has been instructed regarding the correct time, dose, and frequency of taking this medication, including desired effects and most common side effects.   Wayne [name] 10:24 AM 02/09/2018   10 units given today and patient tolerated well

## 2018-02-09 NOTE — Patient Instructions (Signed)
Start the [place] 10 units once daily Check fasting sugars daily and increase the Tresiba by 2 units every 3 days until fasting sugars are mostly < 130.

## 2018-02-14 ENCOUNTER — Other Ambulatory Visit: Payer: Self-pay | Admitting: Family Medicine

## 2018-02-15 ENCOUNTER — Telehealth: Payer: Self-pay | Admitting: Family Medicine

## 2018-02-15 NOTE — Telephone Encounter (Signed)
Spoke with patient and reviewed last office note.

## 2018-02-15 NOTE — Telephone Encounter (Signed)
Copied from [place] [id number]. Topic: General - Other >> Feb 15, 2018 12:31 PM Darl Householder, RMA wrote: Reason for CRM: Patient is requesting a call back from Dr. Elease Hashimoto or CMA concerning insulin medication and instructions

## 2018-02-16 ENCOUNTER — Telehealth: Payer: Self-pay | Admitting: Family Medicine

## 2018-02-16 DIAGNOSIS — M545 Low back pain, unspecified: Secondary | ICD-10-CM

## 2018-02-16 DIAGNOSIS — M79605 Pain in left leg: Secondary | ICD-10-CM

## 2018-02-16 NOTE — Telephone Encounter (Signed)
Ok to refer to orthopedics

## 2018-02-16 NOTE — Telephone Encounter (Signed)
Referral placed.

## 2018-02-16 NOTE — Telephone Encounter (Signed)
Copied from [place]. Topic: Referral - Request >> Feb 16, 2018  3:06 PM Wynetta Emery, [place] C wrote: Reason for CRM: pt's spouse called in to be advised. She said that pt has already been seen. Pt has back, hip and leg pain and is having difficulty walking. They would like to have a referral to a specialist so that pt can be seen further.    Please advise.

## 2018-02-23 ENCOUNTER — Ambulatory Visit (INDEPENDENT_AMBULATORY_CARE_PROVIDER_SITE_OTHER): Payer: Medicare Other | Admitting: Physical Medicine and Rehabilitation

## 2018-02-23 ENCOUNTER — Encounter (INDEPENDENT_AMBULATORY_CARE_PROVIDER_SITE_OTHER): Payer: Self-pay | Admitting: Physical Medicine and Rehabilitation

## 2018-02-23 ENCOUNTER — Other Ambulatory Visit: Payer: Self-pay | Admitting: Internal Medicine

## 2018-02-23 ENCOUNTER — Telehealth (HOSPITAL_COMMUNITY): Payer: Self-pay | Admitting: Pharmacist

## 2018-02-23 VITALS — BP 135/95 | HR 60

## 2018-02-23 DIAGNOSIS — I2510 Atherosclerotic heart disease of native coronary artery without angina pectoris: Secondary | ICD-10-CM | POA: Diagnosis not present

## 2018-02-23 DIAGNOSIS — M21372 Foot drop, left foot: Secondary | ICD-10-CM

## 2018-02-23 DIAGNOSIS — R251 Tremor, unspecified: Secondary | ICD-10-CM

## 2018-02-23 DIAGNOSIS — R2689 Other abnormalities of gait and mobility: Secondary | ICD-10-CM

## 2018-02-23 DIAGNOSIS — M5116 Intervertebral disc disorders with radiculopathy, lumbar region: Secondary | ICD-10-CM | POA: Diagnosis not present

## 2018-02-23 DIAGNOSIS — M5416 Radiculopathy, lumbar region: Secondary | ICD-10-CM

## 2018-02-23 DIAGNOSIS — R531 Weakness: Secondary | ICD-10-CM

## 2018-02-23 NOTE — Telephone Encounter (Signed)
Patient to bring in renewal application for [employer] patient assistance for his Xarelto since it will expire on 03/19/18.   Ruta Hinds. Velva Harman, PharmD, BCPS, CPP Clinical Pharmacist Phone: [phone] 02/23/2018 3:01 PM

## 2018-02-23 NOTE — Progress Notes (Signed)
Wayne [name] - 77 y.o. male MRN [id number]  Date of birth: 1941-09-21  Office Visit Note: Visit Date: 02/23/2018 PCP: [name], MD Referred by: [name], MD  Subjective: Chief Complaint  Patient presents with  . Lower Back - Pain  . Left Leg - Pain, Weakness   HPI: Wayne [name] is a 77 year old gentleman that we have seen on a few occasions now with a really complicated medical history of significant coronary artery disease and heart failure.  He has been followed by Dr. Sharol Given and Dondra Prader.  We completed L5 transforaminal epidural steroid injection on the left that he reported did help significantly for like 3 days and the pain returned.  His history on the left side as well documented with prior surgery and pretty extensive surgery on the left by Dr. Sharol Given.  Electrodiagnostic studies were really unrevealing on the left because of the amount of surgery that have been performed but it did seem to indicate there may be a radicular component to the pain he was having.  He has had focal weakness and foot drop on the left to a degree.  This does not seem to be coming from the spine based on myelogram findings although he does have some central canal stenosis at L3-4 and L4-5.  Again electrodiagnostic study was difficult.  He also reports worsening tremor in his hands and he had asked [place] to evaluate this and I did refer him to Dr. Wells Guiles Tat.  He does have an appointment with her coming up soon.  In terms of his low back pain that still present but still more right-sided is really not something that bothers him as much as the left leg.  By way of review he is got a pretty extensive Schmorl's node is quite large and seemingly acute on the myelogram at L4.  Those can be painful Schmorl's nodes when they are acute.  We like in this to a disc herniation in the endplate or almost like a compression fracture of the endplate.  Nonetheless the patient overall is still doing about the same.  I do  not think any other injections on the left lower leg are done to help.  We reviewed a lot of his history and complaints and activity level.   Review of Systems  Constitutional: Negative for chills, fever, malaise/fatigue and weight loss.  HENT: Negative for hearing loss and sinus pain.   Eyes: Negative for blurred vision, double vision and photophobia.  Respiratory: Negative for cough and shortness of breath.   Cardiovascular: Negative for chest pain, palpitations and leg swelling.  Gastrointestinal: Negative for abdominal pain, nausea and vomiting.  Genitourinary: Negative for flank pain.  Musculoskeletal: Positive for back pain. Negative for myalgias.  Skin: Negative for itching and rash.  Neurological: Positive for tingling, tremors and focal weakness. Negative for weakness.  Endo/Heme/Allergies: Negative.   Psychiatric/Behavioral: Negative for depression.  All other systems reviewed and are negative.  Otherwise per HPI.  Assessment & Plan: Visit Diagnoses:  1. Lumbar radiculopathy   2. Radiculopathy due to lumbar intervertebral disc disorder   3. Tremor   4. Balance problem   5. Weakness   6. Left foot drop     Plan: Findings:  Medically complicated patient with significant coronary artery disease and significant history of trauma and surgery to the left lower extremity.  Patient has had electrodiagnostic study on the left which is really hard to interpret Sharol Given the amount of surgeries had in  his spine findings.  He has myelogram showing disc protrusions which worsened upon standing at L3-4 and L4-5 that contribute to some central canal stenosis.  He does not have any overt frank nerve compression that would lead you to believe this was the source of his left foot drop.  Nonetheless we did complete a left L5 transforaminal injection based on the symptoms in the electrodiagnostic study and this seemingly helped for just a couple of days and the pain returned.  He also had a probably  symptomatic right sided Schmorl's node which was acute on the myelogram.  In terms of his back pain and leg pain there is really nothing short of chronic pain management at this point that would probably help.  We could try another injection at the L4 level or above just to see if the stenosis was the source of the pain but his main concern is with the weakness.  His back pain on the right is controllable with his current level of activity and medication.  He is really concerned about the tremor and I discussed this with him to a degree and feel like is probably essential tremor but he does have an appointment with Dr. Wells Guiles Tat who is a movement disorder specialist, neurologist.  At this point will wait to see what her recommendations are and will proceed accordingly.  He will continue to try to maintain some activity level.  Given very complicated on chronic anticoagulation, heart failure and defibrillator and diabetes.    Meds & Orders: No orders of the defined types were placed in this encounter.  No orders of the defined types were placed in this encounter.   Follow-up: Return if symptoms worsen or fail to improve.   Procedures: No procedures performed  No notes on file   Clinical History: LUMBAR MYELOGRAM  COMPARISON: CT of the abdomen and pelvis 08/24/2017  FINDINGS: LUMBAR MYELOGRAM FINDINGS:  Non rib-bearing lumbar type vertebral bodies are present. Atherosclerotic calcifications present within the aorta.  Slight retrolisthesis is present at L1-2. There is minimal retrolisthesis at L3-4 and slight anterolisthesis at L4-5. Vertebral body heights are maintained.  Mild subarticular narrowing is present bilaterally at L3-4. L3-4 disc protrusion is exaggerated with standing. There is also exaggeration of the L4-5 disc protrusion. Alignment is maintained with standing. There is no change with flexion or extension. Chronic loss of disc height is noted at L5-S1.  CT LUMBAR  MYELOGRAM FINDINGS:  The lumbar spine is imaged from the midbody of T12 through S2-3. Atherosclerotic calcifications are present in the aorta without aneurysm. Limited imaging the abdomen is otherwise unremarkable. There is no significant adenopathy.  A prominent superior endplate Schmorl's node is again noted on the right at L4. The Boney margin is indistinct, suggesting ongoing inflammatory changes. This is similar to the previous appearance.  Endplate degenerative changes are present at L5-S1 with chronic loss of disc height. No other focal lesions are present.  Slight retrolisthesis is again noted at L1-2 and L3-4. There is slight anterolisthesis at L4-5.  T12-L1: Asymmetric right-sided facet hypertrophy is present. There is no significant stenosis.  L1-2: Asymmetric right-sided facet hypertrophy is present. There is no significant central canal stenosis. The disc extends into the right neural foramen. Mild right foraminal narrowing is present.  L2-3: Mild disc bulging and bilateral facet hypertrophy is present. There is no significant stenosis.  L3-4: A broad-based disc protrusion is present. Mild bilateral facet hypertrophy and ligamentum flavum thickening is present. Disc extends into both neural  foramina. There is no significant stenosis.  L4-5: Advanced facet hypertrophy and ligamentum flavum thickening is present. There is a mild broad-based disc protrusion. No focal stenosis is present.  L5-S1: Asymmetric advanced right-sided facet hypertrophy is present. There is chronic loss of disc height with fusion across the disc space. Endplate spurring contributes mild left foraminal narrowing. Facet spurring contributes to mild right foraminal narrowing.  IMPRESSION: LUMBAR MYELOGRAM IMPRESSION:  1. Disc protrusions at L3-4 and L4-5 are significantly worse with standing. These likely contributes to central canal narrowing. 2. Osseous foraminal narrowing at  L5-S1 with chronic loss of disc height and fusion across the disc space. 3. Mild right foraminal narrowing at L1-2 secondary to a far right lateral disc protrusion. 4. Prominent right-sided superior endplate Schmorl's node at L4. This appears to be acutely inflamed. 5. Aortic Atherosclerosis (ICD10-I70.0).  CT LUMBAR MYELOGRAM IMPRESSION:   Electronically Signed By: San Morelle M.D. On: 01/04/2018 11:08   He reports that he quit smoking about 29 years ago. His smoking use included cigarettes. He has a 6.00 pack-year smoking history. He has never used smokeless tobacco.  Recent Labs    10/27/17 0947 11/09/17 1032 02/09/18 1038  HGBA1C 10.3* 11.3 10.7    Objective:  VS:  HT:    WT:   BMI:     BP:(!) 135/95  HR:60bpm  TEMP: ( )  RESP:  Physical Exam  Constitutional: He is oriented to person, place, and time. He appears well-developed and well-nourished. No distress.  HENT:  Head: Normocephalic and atraumatic.  Nose: Nose normal.  Mouth/Throat: Oropharynx is clear and moist.  Eyes: Pupils are equal, round, and reactive to light. Conjunctivae are normal.  Neck: Normal range of motion. Neck supple. No tracheal deviation present.  Cardiovascular: Regular rhythm and intact distal pulses.  Pulmonary/Chest: Effort normal and breath sounds normal.  Abdominal: Soft. He exhibits no distension. There is no rebound and no guarding.  Musculoskeletal: He exhibits no deformity.  Examination of the lumbar spine shows concordant pain with extension rotation and facet joint loading.  He has no focal trigger points.  He has some tenderness over the PSIS on the left more than right.  He has some tenderness over the left greater trochanter more than right.  He has no pain with hip internal or external rotation.  Left lower extremity shows some atrophy and extensive scarring from surgery.  He does have weakness of the left foot compared to right.  Neurological: He is alert and oriented  to person, place, and time. He exhibits normal muscle tone. Coordination normal.  Skin: Skin is warm. No rash noted.  Psychiatric: He has a normal mood and affect. His behavior is normal.  Nursing note and vitals reviewed.   Ortho Exam Imaging: No results found.  Past Medical/Family/Surgical/Social History: Medications & Allergies reviewed per EMR, new medications updated. Patient Active Problem List   Diagnosis Date Noted  . Bladder cancer ([place]) 11/09/2017  . Low back pain radiating to left lower extremity 09/21/2017  . Foot drop, left 08/24/2017  . Diabetes mellitus ([place])   . ICD (implantable cardioverter-defibrillator) discharge 07/05/2017  . Syncope and collapse   . Ventricular tachycardia ([place]) 06/18/2017  . Paroxysmal atrial fibrillation ([place]) 06/10/2017  . Hypoxemia   . VT (ventricular tachycardia) ([place])   . Cardiac arrest ([place]) 05/22/2017  . Defibrillator discharge 02/11/2017  . Atrial fibrillation with rapid ventricular response ([place]) 01/16/2017  . Coronary stent occlusion 11/18/2016  . Type 2 diabetes mellitus, uncontrolled ([place]) 09/30/2016  .  CKD (chronic kidney disease) stage 3, GFR 30-59 ml/min ([facility]) 09/30/2016  . CAD (coronary artery disease) 11/20/2015  . Leucocytoclastic vasculitis ([place]) 06/25/2012  . Staphylococcus aureus bacteremia with sepsis ([place]) 06/18/2012  . NSTEMI, initial episode of care ([place]) 06/16/2012  . Cellulitis and abscess of lower leg 06/14/2012    Class: Acute  . Healthcare-associated pneumonia 06/14/2012    Class: Acute  . Hyperglycemia 06/14/2012    Class: Acute  . Leg swelling 04/22/2012  . Urethral stricture 04/15/2012  . Gross hematuria 03/22/2012  . Hematochezia 03/22/2012  . Obesity 01/08/2012  . ED (erectile dysfunction) of organic origin 09/11/2011  . OSA (obstructive sleep apnea) 09/02/2011  . Systolic CHF, chronic ( [place]) 06/10/2011  . HEMATOCHEZIA 02/10/2011  . BURSITIS, LEFT HIP 02/10/2011  . CORONARY ATHEROSCLEROSIS NATIVE  CORONARY ARTERY 01/27/2011  . CARDIOMYOPATHY 01/21/2011  . CHEST PAIN UNSPECIFIED 01/20/2011  . DYSPNEA 01/16/2011  . ABDOMINAL PAIN, UNSPECIFIED SITE 01/13/2011  . ECZEMA 01/28/2010  . BRONCHITIS, ACUTE WITH MILD BRONCHOSPASM 12/21/2009  . HERPES ZOSTER OPHTHALMICUS 07/19/2009  . ADENOCARCINOMA, PROSTATE 06/20/2009  . Secondary DM with peripheral vascular disease, uncontrolled ([place]) 06/20/2009  . Hyperlipidemia 06/20/2009  . Essential hypertension 06/20/2009  . HYPERTROPHY PROSTATE W/UR OBST & OTH LUTS 06/20/2009   Past Medical History:  Diagnosis Date  . Adenocarcinoma of prostate ([place])    s/p seed implants  . Arthritis   . Atrial fibrillation ([place])   . Cellulitis of left leg    a. 12/173 complicated by septic shock  . Chronic combined systolic and diastolic CHF (congestive heart failure) ([place])   . Colon polyps   . CORONARY ATHEROSCLEROSIS NATIVE CORONARY ARTERY    a. 01/2011 Cath/PCI: LM nl, LAD 40-50p, D1 80-small, LCX 95-small, RI 90, RCA 100, EF 20%;  b. 01/2011 Card MRI - No transmural scar;  c. 01/2011 PCI RCA->5 Promus DES, RI->3.0x16 Promus DES; d. Cath 01/13/13 patent LAD & Ramus, diffuse LCx dz, RCA mult overlapping stents w/ 95% osital stenosis, EF 20%, s/p DES to ostial/prox RCA 01/24/13   . Diabetes mellitus, type 2 ([place])   . GERD (gastroesophageal reflux disease)   . Hematoma of leg    a. left leg hematoma 03/2012 in the setting of asa/effient  . Herpes zoster ophthalmicus   . HYPERLIPIDEMIA    intolerant to Lipitor (myalgias)  . HYPERTENSION   . Ischemic cardiomyopathy    a. 01/2012 Echo EF 45%, mild LVH; b. ZW25%, grade 1 diastolic dysfunction, diffuse hypokinesis, inferoposterior akinesis   . Noncompliance   . Obesity   . OSA (obstructive sleep apnea)   . Polymyalgia rheumatica ([facility])    Family History  Problem Relation Age of Onset  . Cancer Mother 86       unknown CA  . Heart disease Father   . Alcohol abuse Father   . Heart attack Father 69  . Heart  attack Brother    Past Surgical History:  Procedure Laterality Date  . CARDIAC CATHETERIZATION  01/24/2013  . CARDIAC CATHETERIZATION N/A 11/14/2016   Procedure: Right/Left Heart Cath and Coronary Angiography;  Surgeon: Larey Dresser, MD;  Location: [facility];  Service: Cardiovascular;  Laterality: N/A;  . CARDIAC CATHETERIZATION N/A 11/17/2016   Procedure: Coronary Stent Intervention w/Impella;  Surgeon: Peter M Martinique, MD;  [facility];  Service: Cardiovascular;  Laterality: N/A;  . CARDIAC CATHETERIZATION N/A 11/17/2016   Procedure: Coronary Atherectomy;  Surgeon: Peter M Martinique, MD;  Location: [facility];  Service: Cardiovascular;  Laterality: N/A;  . CORONARY ANGIOPLASTY WITH STENT PLACEMENT  01/24/2013   DES to RCA  . CORONARY BALLOON ANGIOPLASTY N/A 07/06/2017   Procedure: Coronary Balloon Angioplasty;  Surgeon: Sherren Mocha, MD;  Location: [facility];  Service: Cardiovascular;  Laterality: N/A;  . CORONARY STENT PLACEMENT  2012   reports 6 stents placed  . I&D EXTREMITY  06/15/2012   Procedure: IRRIGATION AND DEBRIDEMENT EXTREMITY;  Surgeon: Newt Minion, MD;  Location: [facility];  Service: Orthopedics;  Laterality: Left;  I&D Left Posterior Knee  . I&D EXTREMITY  06/30/2012   Procedure: IRRIGATION AND DEBRIDEMENT EXTREMITY;  Surgeon: Newt Minion, MD;  Location: [facility];  Service: Orthopedics;  Laterality: Left;  Left Leg Irrigation and Debridement and placement of Wound VAC and application of  A-cell  . I&D EXTREMITY  07/20/2012   Procedure: IRRIGATION AND DEBRIDEMENT EXTREMITY;  Surgeon: Newt Minion, MD;  Location: [facility];  Service: Orthopedics;  Laterality: Left;  Irrigation and Debridement Left Leg and Place antibiotic beads   . ICD IMPLANT N/A 02/12/2017   Procedure: ICD Implant;  Surgeon: Evans Lance, MD;  Location: [facility];  Service: Cardiovascular;  Laterality: N/A;  . LEAD REVISION/REPAIR N/A 05/26/2017   Procedure: Lead  Revision/Repair;  Surgeon: Evans Lance, MD;  Location: [facility];  Service: Cardiovascular;  Laterality: N/A;  . LEFT HEART CATH AND CORONARY ANGIOGRAPHY N/A 07/06/2017   Procedure: Left Heart Cath and Coronary Angiography;  Surgeon: Larey Dresser, MD;  Location: [facility];  Service: Cardiovascular;  Laterality: N/A;  . PERCUTANEOUS CORONARY STENT INTERVENTION (PCI-S) N/A 01/24/2013   Procedure: PERCUTANEOUS CORONARY STENT INTERVENTION (PCI-S);  Surgeon: Wellington Hampshire, MD;  Location: [facility];  Service: Cardiovascular;  Laterality: N/A;  . PROSTATE SURGERY     cancer, seed implant  . TRANSURETHRAL RESECTION OF BLADDER TUMOR N/A 10/30/2017   Procedure: TRANSURETHRAL RESECTION OF BLADDER TUMOR (TURBT)/ INSTILLATION OF EPIRUBICIN;  Surgeon: Festus Aloe, MD;  Location: [facility];  Service: Urology;  Laterality: N/A;  . ULTRASOUND GUIDANCE FOR VASCULAR ACCESS  11/14/2016   Procedure: Ultrasound Guidance For Vascular Access;  Surgeon: Larey Dresser, MD;  Location: [facility];  Service: Cardiovascular;;  . Stephanie Coup ABLATION N/A 06/19/2017   Procedure: Stephanie Coup Ablation;  Surgeon: Evans Lance, MD;  Location: [facility];  Service: Cardiovascular;  Laterality: N/A;   Social History   Occupational History  . Occupation: RETIRED    Employer: RETIRED    Comment: TRUCK DRIVER  Tobacco Use  . Smoking status: Former Smoker    Packs/day: 0.30    Years: 20.00    Pack years: 6.00    Types: Cigarettes    Last attempt to quit: 02/23/1989    Years since quitting: 29.1  . Smokeless tobacco: Never Used  Substance and Sexual Activity  . Alcohol use: No  . Drug use: No  . Sexual activity: Not on file

## 2018-03-01 ENCOUNTER — Encounter (HOSPITAL_COMMUNITY): Payer: Self-pay

## 2018-03-01 ENCOUNTER — Ambulatory Visit (HOSPITAL_COMMUNITY)
Admission: RE | Admit: 2018-03-01 | Discharge: 2018-03-01 | Disposition: A | Discharge status: Home / Self Care | Payer: Medicare Other | Source: Ambulatory Visit | Attending: Cardiology | Admitting: Cardiology

## 2018-03-01 ENCOUNTER — Encounter: Payer: Self-pay | Admitting: *Deleted

## 2018-03-01 VITALS — BP 134/66 | HR 65 | Wt 236.8 lb

## 2018-03-01 DIAGNOSIS — M1990 Unspecified osteoarthritis, unspecified site: Secondary | ICD-10-CM | POA: Diagnosis not present

## 2018-03-01 DIAGNOSIS — Z7901 Long term (current) use of anticoagulants: Secondary | ICD-10-CM | POA: Insufficient documentation

## 2018-03-01 DIAGNOSIS — Z809 Family history of malignant neoplasm, unspecified: Secondary | ICD-10-CM | POA: Insufficient documentation

## 2018-03-01 DIAGNOSIS — G4733 Obstructive sleep apnea (adult) (pediatric): Secondary | ICD-10-CM | POA: Diagnosis not present

## 2018-03-01 DIAGNOSIS — I462 Cardiac arrest due to underlying cardiac condition: Secondary | ICD-10-CM | POA: Insufficient documentation

## 2018-03-01 DIAGNOSIS — E1122 Type 2 diabetes mellitus with diabetic chronic kidney disease: Secondary | ICD-10-CM | POA: Diagnosis not present

## 2018-03-01 DIAGNOSIS — E875 Hyperkalemia: Secondary | ICD-10-CM | POA: Insufficient documentation

## 2018-03-01 DIAGNOSIS — I10 Essential (primary) hypertension: Secondary | ICD-10-CM | POA: Diagnosis not present

## 2018-03-01 DIAGNOSIS — E785 Hyperlipidemia, unspecified: Secondary | ICD-10-CM | POA: Diagnosis not present

## 2018-03-01 DIAGNOSIS — I255 Ischemic cardiomyopathy: Secondary | ICD-10-CM | POA: Diagnosis not present

## 2018-03-01 DIAGNOSIS — N183 Chronic kidney disease, stage 3 unspecified: Secondary | ICD-10-CM

## 2018-03-01 DIAGNOSIS — I5022 Chronic systolic (congestive) heart failure: Secondary | ICD-10-CM | POA: Diagnosis not present

## 2018-03-01 DIAGNOSIS — I2582 Chronic total occlusion of coronary artery: Secondary | ICD-10-CM | POA: Diagnosis not present

## 2018-03-01 DIAGNOSIS — Z8249 Family history of ischemic heart disease and other diseases of the circulatory system: Secondary | ICD-10-CM | POA: Insufficient documentation

## 2018-03-01 DIAGNOSIS — I350 Nonrheumatic aortic (valve) stenosis: Secondary | ICD-10-CM | POA: Diagnosis not present

## 2018-03-01 DIAGNOSIS — E669 Obesity, unspecified: Secondary | ICD-10-CM | POA: Diagnosis not present

## 2018-03-01 DIAGNOSIS — Z811 Family history of alcohol abuse and dependence: Secondary | ICD-10-CM | POA: Diagnosis not present

## 2018-03-01 DIAGNOSIS — I130 Hypertensive heart and chronic kidney disease with heart failure and stage 1 through stage 4 chronic kidney disease, or unspecified chronic kidney disease: Secondary | ICD-10-CM | POA: Insufficient documentation

## 2018-03-01 DIAGNOSIS — I480 Paroxysmal atrial fibrillation: Secondary | ICD-10-CM | POA: Diagnosis not present

## 2018-03-01 DIAGNOSIS — Z9889 Other specified postprocedural states: Secondary | ICD-10-CM | POA: Diagnosis not present

## 2018-03-01 DIAGNOSIS — I2510 Atherosclerotic heart disease of native coronary artery without angina pectoris: Secondary | ICD-10-CM | POA: Diagnosis not present

## 2018-03-01 DIAGNOSIS — Z79899 Other long term (current) drug therapy: Secondary | ICD-10-CM | POA: Insufficient documentation

## 2018-03-01 DIAGNOSIS — I4901 Ventricular fibrillation: Secondary | ICD-10-CM | POA: Insufficient documentation

## 2018-03-01 DIAGNOSIS — K219 Gastro-esophageal reflux disease without esophagitis: Secondary | ICD-10-CM | POA: Insufficient documentation

## 2018-03-01 DIAGNOSIS — Z87891 Personal history of nicotine dependence: Secondary | ICD-10-CM | POA: Insufficient documentation

## 2018-03-01 DIAGNOSIS — M353 Polymyalgia rheumatica: Secondary | ICD-10-CM | POA: Diagnosis not present

## 2018-03-01 DIAGNOSIS — Z006 Encounter for examination for normal comparison and control in clinical research program: Secondary | ICD-10-CM

## 2018-03-01 DIAGNOSIS — Z7984 Long term (current) use of oral hypoglycemic drugs: Secondary | ICD-10-CM | POA: Insufficient documentation

## 2018-03-01 DIAGNOSIS — T783XXA Angioneurotic edema, initial encounter: Secondary | ICD-10-CM | POA: Insufficient documentation

## 2018-03-01 LAB — BASIC METABOLIC PANEL
Anion gap: 10 (ref 5–15)
BUN: 22 mg/dL — ABNORMAL HIGH (ref 6–20)
CO2: 25 mmol/L (ref 22–32)
CREATININE: 1.41 mg/dL — AB (ref 0.61–1.24)
Calcium: 9.4 mg/dL (ref 8.9–10.3)
Chloride: 103 mmol/L (ref 101–111)
GFR calc Af Amer: 55 mL/min — ABNORMAL LOW (ref >60)
GFR calc non Af Amer: 48 mL/min — ABNORMAL LOW (ref >60)
GLUCOSE: 333 mg/dL — AB (ref 65–99)
Potassium: 4.7 mmol/L (ref 3.5–5.1)
Sodium: 138 mmol/L (ref 135–145)

## 2018-03-01 LAB — LIPID PANEL
Cholesterol: 164 mg/dL (ref 0–200)
HDL: 45 mg/dL (ref >40)
LDL CALC: 86 mg/dL (ref 0–99)
Total CHOL/HDL Ratio: 3.6 RATIO
Triglycerides: 164 mg/dL — ABNORMAL HIGH (ref <150)
VLDL: 33 mg/dL (ref 0–40)

## 2018-03-01 LAB — MAGNESIUM: Magnesium: 2 mg/dL (ref 1.7–2.4)

## 2018-03-01 NOTE — Progress Notes (Signed)
Patient ID: NILSON TABORA, male   DOB: [birth year], 77 y.o.   MRN: [id number]   Advanced Heart Failure Clinic Note   Patient ID: [name], male   DOB: [birth year], 77 y.o.   MRN: [id number] PCP: Dr. Elease Hashimoto Cardiology: Dr. Earline Mayotte [name] is a 77 y.o. male  with history of DM, HTN, CAD, and ischemic cardiomyopathy who presents for followup of CHF and CAD. Patient had a CHF exacerbation in 2/12 and was found to have LV systolic dysfunction with EF around 20%. LHC showed RCA, ramus, and CFX disease. RCA was subtotally occluded. Cardiac MRI showed that all walls, including the inferior wall, should be viable. Patient therefore underwent opening of his chronic totally occluded RCA as well as PCI to the ramus in 2/12. He received drug eluting stents and was on Effient.  He had an echo in 2/13 that showed EF improved to 45% with moderate LV dilation and mild LV hypertrophy.   In 5/13, he developed a large left lower leg hematoma.  He was still on Effient at that time.  ASA and Effient were stopped.  The hematoma did not resolve.  In 7/13, he was re-admitted with septic shock from MSSA from an abscess in his left gastrocnemius.  He also grew Pseudomonas from the left gastrocnemius as well.  He had a prolonged course in the hospital and later in a rehab facility.  Ultimately, he got back home again.  I had him get an echo in 1/14, and this showed EF 15% with diffuse hypokinesis and inferoposterior akinesis.  He had been off of most of his prior cardiac medications.  Took him back for [facility] in 1/14.  This showed subtotal occlusion of a small AV LCx and 95% ostial in-stent restenosis in the RCA.  He was treated in 2/14 with a Xience DES to the ostial RCA and begun on Plavix.  Unfortunately, he developed diffuse hives after starting Plavix and had to be switched to ticlopidine.  He has tolerated ticlopidine.   Echo done in 12/14 showed some improvement in LV function but EF was still low at 30-35%.  He did not want  ICD.    Presented to ED 11/14/15 with worsening SOB after a few steps and CXR with CHF and bilateral effusions in the setting of marked medical non-compliance. States he stopped taking his medicines in 01/2015 (except for ASA 81 and a multivitamin).  He was feeling good and decided that he did not need them anymore.  Also c/o URI symptoms. Diuresed over 2 L with IV diuretics in the ER and started on lasix 20 mg daily.   Echo (12/16) showed EF 20-25% with diffuse hypokinesis (down from 35% in 12/15).  Echo 10/17 showed EF 15% with mildly decreased RV systolic function.    He had left and right heart catheterization in 12/17. This showed elevated right and left heart filling pressures and low cardiac output. The RCA was totally occluded with collaterals, there was 95% ostial to mid LAD stenosis.  Patient had PCI with DES to proximal-mid LAD.    He was noted to be in atrial fibrillation with RVR in 2/18 at cardiac rehab. He felt fatigued.  He was admitted and started on amiodarone for rate control. ASA was stopped, ticagrelor was decreased to 60 mg bid, and Xarelto 15 mg daily was started.  He converted spontaneously back to NSR.    He suffered a ventricular fibrillation arrest in 3/18.  Luckily, he was wearing  a Lifevest and was shocked.  He was admitted and started on amiodarone.  Medtronic ICD was placed.  Echo 3/18 with EF 20-25%.   Admitted again 6/18 due to VF arrest. ICD lead had been undersensing leading to prolongation of time to discharge.  He underwent RV lead extraction with new lead implant.   He was admitted again with VT storm in 7/18.  No prior chest pain or symptoms of worsening CAD.  He had VT ablation this admission.   Admitted with ICD shock 07/05/17. He had coronary angiography this admission.  Had cutting balloon angioplasty of severe in-stent restenosis in the proximal LAD. Pt had additional VT 07/07/17, EP saw and recommended BB titration. Po amio up-titrated. Discharge weight 228  lbs  He had TURBT 11/18, urothelial cancer diagnosed.    He returns today for regular follow up. At last visit losartan increased. Weight down 2 lbs from last visit.  BP improved. He denies lightheadedness or dizziness. No orthopnea or chest pain. His primary c/o remains Left hip and knee pain, as well as back pain. He sees a specialist in several weeks. Taking all medications as directed. He is working on getting coverage for [place], but may have to wait until open enrollment in October.   Medtronic device interrogated: Thoracic impedence stable around threshold. No recent optivol crossings. No VT/VF. Pt activity ~ 3 hrs daily.  Labs (12/16): K 4.9, creatinine 1.23 Labs (1/17): K 4.7, creatinine 1.15, BNP 1433 Labs (2/17): K 4.7, creatinine 1.14, BNP 870 Labs (3/17): K 4.5, creatinine 1.10, BNP 1257 Labs (5/17): K 4.2, creatinine 1.2, BNP 818 Labs (7/17): LDL 149, HDL 40, TGs 210 Labs (9/17): K 4.9, creatinine 1.36 Labs (10/17): K 3.7, creatinine 1.2, LDL 87, HDL 32, LFTs normal Labs (12/17): K 4.5, creatinine 1.34, digoxin 0.3 Labs (2/18): K 4.4, creatinine 1.48, hgb 11.8 Labs (3/18): K 5 => 4.6, creatinine 1.54 => 1.46, digoxin 1.3 => 0.3 Labs (5/18): TSH normal Labs (7/18): K 3.7, creatinine 1.33, hgb 11.4, digoxin 0.6, LFTs normal Labs (8/18): K 5.1, creatinine 1.47, hgb 13.3 Labs (10/18): LDL 50, HDL 40, TGs 247 Labs (11/18): K 5.9, creatinine 1.42, hgb 15.2 Labs (12/18): K 5, creatinine 1.27, digoxin 0.6, LFTs normal, TSH normal  Past Medical History:  1. HYPERTENSION  2. HYPERLIPIDEMIA: Myalgias with atorvastatin and Crestor 3. ECZEMA  4. RHINITIS  5. HERPES ZOSTER OPHTHALMICUS  6. ADENOCARCINOMA, PROSTATE: Status post prostatectomy in 2009. Has had some incontinence since then.  7. Diabetes mellitus type II  8. Arthritis  9. Obesity  10. GERD: rare  11. CAD: Presented with exertional dyspnea, never had chest pain. LHC (2/12) with subtotalled proximal RCA and left to  right collaterals, 90% proximal moderate-sized ramus, 95% proximal relatively small CFX, 40-50% proximal LAD. Cardiac MRI (2/12) showed EF 21%, some mild scar in basal segments but all wall segments would be expected to be viable. DES x 5 (overlapping) to RCA, DES x 1 to RI 01/30/11 .  Bled into leg with Effient use (long, complicated course).  LHC (1/14): AV LCx small with subtotal occlusion, 95% ostial instent restenosis in RCA. PCI in 2/14 to ostial RCA with 3.5 x 15 Xience DES.  Plavix allergy (hives) so put on ticlopidine.  - LHC (12/17):  the RCA was totally occluded with collaterals, there was 95% ostial to mid LAD stenosis, patient had PCI with DES to proximal-mid LAD. - LHC (7/18): Totally occluded RCA with left to right collaterals, subtotal occlusion of small AV LCx,  90% ISR proximal LAD => cutting balloon angioplasty.  12. Ischemic CMP: Echo (2/12) with moderately dilated LV, EF about 20% with diffuse hypokinesis and inferior akinesis, pseudonormal diastolic function, mild MR, severe LAE, mildly decreased RV systolic function. RHC (2/12) with mean RA 12, PA 40/25, mean PCWP 26, CI 2.1.  Echo (5/12) with EF 40% (appeared worse to my eye) with posterior HK, basal inferior AK, inferoseptal AK, basal anteroseptal AK, mild MR.  Cardiac MRI was repeated and showed EF 32% (improved from 21%) and mild LV dilation (was severely dilated before) with diffuse hypokinesis and subendocardial scar in the basal inferior, basal posterior, and basal anterolateral segments.  Echo (2/13) with EF 45%, moderate LV dilation, mild LVH.  Echo (1/14) with EF 15%, diffuse hypokinesis, inferoposterior akinesis, mild MR, grade I diastolic dysfunction.  Echo (5/14) with EF 30-35%, mild LV dilation, akinesis of the basal inferior wall otherwise diffuse hypokinesis.  Echo (12/14) with EF 30-35%, mild LV dilation, diffuse hypokinesis with basal inferior and posterior akinesis. Echo (12/15) with EF 35%, mildly dilated LV, wall motion  abnormalities, normal RV size and systolic function. Echo (12/16) with EF 20-25%, diffuse hypokinesis, severe LV dilation, mild MR.  - Echo (10/17): EF 15%, grade II diastolic dysfunction, mild MR, mildly decreased RV systolic function.  - Possible angioedema related to Entresto.  - Hyperkalemia with spironolactone 12.5 daily.  - CPX (10/17): peak VO2 10.6, VE/VCO2 slope 48.6, RER 1.12.  Severe HF limitation.  - RHC (12/17): mean RA 14, PA 53/27, mean PCWP 25, CI 1.93.  - Echo (3/18): EF 20-25%, moderate LV dilation, mild LVH, normal RV size and systolic function, mild aortic stenosis.  - Medtronic ICD 3/18.  - Echo (6/18): EF 25-30%, mild AS.  13. Cervical OA.  14. Polymyalgia rheumatica 15. OSA: Severe on sleep study 10/12.  On CPAP.  16. Left lower leg hematoma in setting of Effient use.  He developed a left gastrocnemius abscess and septic shock with prolonged hospitalization beginning in 7/13.  17. PAD: Lower extremity arterial doppler evaluation in 2/17 showed occluded peroneal arteries bilaterally, ABI 1.1 (R) and 1.0 (L).   - ABIs normal in 1/19.  18. Carotid dopplers (10/17) with minimal disease.  19. Atrial fibrillation: Paroxysmal.  20. Ventricular fibrillation arrest: 3/18.  Medtronic ICD placed, now on amiodarone.  - VT storm 7/18, now s/p VT ablation.  21. Aortic stenosis: Mild on 6/18 echo.  22. Urothelial cancer: s/p TURBT of bladder tumor 11/18.   Family History:  Father died with MI at age 16. He was an alcoholic. Mother died with cancer at around 26.   Social History:  Occupation: retired Administrator  Married, lives in [place]  Past smoker, quit around [place] of systems complete and found to be negative unless listed in HPI.    Current Outpatient Medications  Medication Sig Dispense Refill  . ACCU-CHEK AVIVA PLUS test strip CHECK BLOOD SUGAR ONCE TO TWICE DAILY. 200 each 0  . amiodarone (PACERONE) 200 MG tablet Take 200 mg by mouth daily.    .  carvedilol (COREG) 12.5 MG tablet Take 1 tablet (12.5 mg total) by mouth 2 (two) times daily. 60 tablet 6  . diphenhydramine-acetaminophen (TYLENOL PM) 25-500 MG TABS tablet Take 1 tablet at bedtime as needed by mouth (for sleep).     Marland Kitchen doxycycline (VIBRAMYCIN) 100 MG capsule Take 1 capsule (100 mg total) by mouth 2 (two) times daily. 20 capsule 0  . furosemide (LASIX) 20 MG tablet  Take 1 tablet (20 mg total) by mouth 2 (two) times daily. 30 tablet 6  . glimepiride (AMARYL) 4 MG tablet Take 1 tablet (4 mg total) by mouth daily before breakfast. 30 tablet 5  . insulin degludec (TRESIBA FLEXTOUCH) 100 UNIT/ML SOPN FlexTouch Pen Inject 10 Units into the skin daily at 10 pm.    . losartan (COZAAR) 25 MG tablet Take 1 tablet (25 mg total) by mouth 2 (two) times daily. 60 tablet 3  . Multiple Vitamins-Minerals (CENTRUM SILVER ADULT 50+) TABS Take 1 tablet by mouth daily.    . Omega-3 Fatty Acids (FISH OIL) 1000 MG CAPS Take 1,000 mg by mouth 2 (two) times daily.     . Patiromer Sorbitex Calcium (VELTASSA) 16.8 g PACK Take by mouth.    . rivaroxaban (XARELTO) 20 MG TABS tablet Take 1 tablet (20 mg total) by mouth daily with supper. 30 tablet 3  . rosuvastatin (CRESTOR) 10 MG tablet Take 1 tablet (10 mg total) by mouth daily. 30 tablet 11  . spironolactone (ALDACTONE) 25 MG tablet Take 0.5 tablets (12.5 mg total) by mouth every evening. 15 tablet 6  . TRUEPLUS LANCETS 30G MISC CHECK BLOOD SUGAR ONCE TO TWICE DAILY 200 each 0   No current facility-administered medications for this encounter.    Facility-Administered Medications Ordered in Other Encounters  Medication Dose Route Frequency Provider Last Rate Last Dose  . epirubicin (ELLENCE) 50 mg in sodium chloride 0.9 % bladder instillation  50 mg Bladder Instillation Once Festus Aloe, MD        Vitals:   03/01/18 1129  BP: 134/66  Pulse: 65  SpO2: 92%  Weight: 236 lb 12.8 oz (107.4 kg)   Wt Readings from Last 3 Encounters:  03/01/18 236  lb 12.8 oz (107.4 kg)  02/09/18 235 lb 14.4 oz (107 kg)  01/18/18 238 lb 14.4 oz (108.4 kg)   General: NAD.  HEENT: Normal Neck: Supple. JVP 5-6. Carotids 2+ bilat; no bruits. No thyromegaly or nodule noted. Cor: PMI nondisplaced. RRR, 1/6 SEM RUSB. Trace ankle edema.  Lungs: CTAB, normal effort. Abdomen: Soft, non-tender, non-distended, no HSM. No bruits or masses. +BS  Extremities: No cyanosis, clubbing, or rash. Neuro: Alert & orientedx3, cranial nerves grossly intact. moves all 4 extremities w/o difficulty. Affect pleasant   Assessment/Plan:  1. Chronic systolic CHF: Ischemic cardiomyopathy.  6/18 echo with EF 25-30%, diffuse hypokinesis.  CPX in 10/17 with severe HF limitation.  RHC in 12/17 with elevated filling pressures and low cardiac output.  Medtronic ICD in 3/18 after vfib arrest. He had VT again in 6/18, then VT storm in 7/18 now s/p VT ablation.   - NYHA class II symptoms - Volume status stable on exam and optivol.  - Continue lasix 20 mg BID.  - Continue Coreg at 12.5 mg BID - Continue spironolactone 12.5 daily.   - Continue losartan 25 mg bid. He had angioedema with Entresto.  BMET today.  - He is off digoxin and would like to remain on. OK.  - He may be an LVAD candidate in the future.   - Reinforced fluid restriction to < 2 L daily, sodium restriction to less than 2000 mg daily, and the importance of daily weights.   2. CAD:  Now s/p cutting balloon angioplasty to in-stent restenosis in proximal LAD.  He has an occluded RCA but there were left to right collaterals.   - No s/s of ischemia.    - He has been on Repatha with  good lipid control.  Unfortunately, he will not be able to get Repatha in the future as he does not have Medicare part D.   - Continue Crestor 10 mg daily. Lipids today.  - He is not on ASA given Xarelto use.  3. HLD: See discussion above regarding restarting Crestor.   Above. 4. PAD: Normal ABIs in 1/19.    5. Diabetes type II: Continue  empagliflozin.  6. Atrial fibrillation:  - NSR today.  - Continue Xarelto 20 mg daily. - Continue amiodarone 200 mg daily   Check LFTs and TSH today.  He will need regular eye exams.  - Would consider atrial fibrillation ablation based on CASTLE-HF data if AF recurs. 7. Ventricular fibrillation arrest: On amiodarone and has Medtronic ICD.  He had VT ablation in 7/18 after episode of VT storm.  - No VT on ICD consideration.   8. OSA:  - Not using CPAP.  9. CKD: Stage 3.  - BMET today.  - Continue Veltassa   RTC 2 months with echo. Will not change meds today with bad arthritis pain driving up BP and close specialist follow up.   Shirley Friar, PA-C  03/01/2018

## 2018-03-01 NOTE — Patient Instructions (Signed)
No changes to medication at this time.  Routine lab work today. Will notify you of abnormal results, otherwise no news is good news!  Follow up 2 months with Dr. Aundra Dubin and echocardiogram.  _________________________________________________________ Wayne [name] Code: 9485  Take all medication as prescribed the day of your appointment. Bring all medications with you to your appointment.  Do the following things EVERYDAY: 1) Weigh yourself in the morning before breakfast. Write it down and keep it in a log. 2) Take your medicines as prescribed 3) Eat low salt foods-Limit salt (sodium) to 2000 mg per day.  4) Stay as active as you can everyday 5) Limit all fluids for the day to less than 2 liters

## 2018-03-02 NOTE — Progress Notes (Signed)
RESEARCH ENCOUNTER  Patient ID: TILFORD DEATON  DOB: [birth year]  Kelton Pillar presented to the [facility] for the Month 18 BEAT HF Visit.  Subject complaining of pain in back and left leg (chronic).  Following up with orthopedic physician.

## 2018-03-03 NOTE — Progress Notes (Signed)
Wayne [name] was seen today in the movement disorders clinic for neurologic consultation at the request of Magnus Sinning, MD.  The consultation is for the evaluation of tremor and gait instability.  Specific Symptoms:  Tremor: Yes.     How long has it been going on? 6+ months, only the R hand.  (states that he had defibrillator placed last year and got shocked a lot.  Not sure if this initiated some of his sx's, esp memory.  On amiodarone for about a year; states tremor actually got better when he was "on the full dosage" but has decreased from 3 to 1 tablet)  At rest or with activation?  With activation - only the thumb and index  When is it noted the most?  Eating   Fam hx of tremor?  No.  Affected by caffeine:  No. (drinks 1 coffee per day)  Affected by alcohol:  Doesn't drink EtOH  Affected by stress:  Yes.  , perhaps.    Spills soup if on spoon:  No.  Spills glass of liquid if full:  May or may not   Affects ADL's (tying shoes, brushing teeth, etc):  Yes.   (trouble with buttoning - buttons pants and shirts before he puts them on)  Other sx's: Voice: no change Sleep: trouble staying asleep   Vivid Dreams:  No.  Acting out dreams:  No. Postural symptoms:  Yes.   (uses cane; if leans head back to look up will lose balance; has paresthesias of the feet)  Falls?  Yes.   - fell last week - was trying to hold onto something and it moved out from him and he fell; no other falls; trouble getting off of the floor) Bradykinesia symptoms: short stride length Loss of smell:  Yes.   Loss of taste:  Yes.   Urinary Incontinence:  Rarely with urinary urgency Difficulty Swallowing:  No. Handwriting, micrographia: Yes.   Mood:  States that he is easily angered which is strange for him - has a quick temper Memory changes:  Yes.   - some short term and trouble with names; remembering plot of book N/V:  No. Lightheaded:  No.  Syncope: No. Diplopia:  No. Dyskinesia:  No.   CT brain was  completed on June 18, 2017.  I had the opportunity to review this.  This was essentially unremarkable.  Formal report indicates atrophy (very little noted).  PREVIOUS MEDICATIONS: none to date  ALLERGIES:   Allergies  Allergen Reactions  . Entresto [Sacubitril-Valsartan] Swelling and Other (See Comments)    Angioedema  . Crestor [Rosuvastatin Calcium] Other (See Comments)    Myalgia, Interfering with Gait  . Lipitor [Atorvastatin] Other (See Comments)    Makes legs sore  . Plavix [Clopidogrel] Itching, Swelling and Other (See Comments)    Nose bleeds and welts on legs & back  . Ancef [Cefazolin] Itching, Rash and Other (See Comments)    Describes itching and rash, but said that "it wasn't that bad"    CURRENT MEDICATIONS:  Outpatient Encounter Medications as of 03/08/2018  Medication Sig  . amiodarone (PACERONE) 200 MG tablet Take 200 mg by mouth daily.  . carvedilol (COREG) 12.5 MG tablet Take 1 tablet (12.5 mg total) by mouth 2 (two) times daily.  . diphenhydramine-acetaminophen (TYLENOL PM) 25-500 MG TABS tablet Take 1 tablet at bedtime as needed by mouth (for sleep).   Marland Kitchen doxycycline (VIBRAMYCIN) 100 MG capsule Take 1 capsule (100 mg total) by mouth  2 (two) times daily.  . furosemide (LASIX) 20 MG tablet Take 1 tablet (20 mg total) by mouth 2 (two) times daily.  Marland Kitchen glimepiride (AMARYL) 4 MG tablet Take 1 tablet (4 mg total) by mouth daily before breakfast.  . insulin degludec (TRESIBA FLEXTOUCH) 100 UNIT/ML SOPN FlexTouch Pen Inject 10 Units into the skin daily at 10 pm.  . losartan (COZAAR) 25 MG tablet Take 1 tablet (25 mg total) by mouth 2 (two) times daily.  . Multiple Vitamins-Minerals (CENTRUM SILVER ADULT 50+) TABS Take 1 tablet by mouth daily.  . Omega-3 Fatty Acids (FISH OIL) 1000 MG CAPS Take 1,000 mg by mouth 2 (two) times daily.   . Patiromer Sorbitex Calcium (VELTASSA) 16.8 g PACK Take by mouth.  . rivaroxaban (XARELTO) 20 MG TABS tablet Take 1 tablet (20 mg total) by  mouth daily with supper.  . rosuvastatin (CRESTOR) 10 MG tablet Take 1 tablet (10 mg total) by mouth daily.  Marland Kitchen spironolactone (ALDACTONE) 25 MG tablet Take 0.5 tablets (12.5 mg total) by mouth every evening.  Marland Kitchen ACCU-CHEK AVIVA PLUS test strip CHECK BLOOD SUGAR ONCE TO TWICE DAILY. (Patient not taking: Reported on 03/08/2018)  . TRUEPLUS LANCETS 30G MISC CHECK BLOOD SUGAR ONCE TO TWICE DAILY (Patient not taking: Reported on 03/08/2018)   Facility-Administered Encounter Medications as of 03/08/2018  Medication  . epirubicin (ELLENCE) 50 mg in sodium chloride 0.9 % bladder instillation    PAST MEDICAL HISTORY:   Past Medical History:  Diagnosis Date  . Adenocarcinoma of prostate ([place])    s/p seed implants  . Arthritis   . Atrial fibrillation ([place])   . Cellulitis of left leg    a. 05/2693 complicated by septic shock  . Chronic combined systolic and diastolic CHF (congestive heart failure) ([place])   . Colon polyps   . CORONARY ATHEROSCLEROSIS NATIVE CORONARY ARTERY    a. 01/2011 Cath/PCI: LM nl, LAD 40-50p, D1 80-small, LCX 95-small, RI 90, RCA 100, EF 20%;  b. 01/2011 Card MRI - No transmural scar;  c. 01/2011 PCI RCA->5 Promus DES, RI->3.0x16 Promus DES; d. Cath 01/13/13 patent LAD & Ramus, diffuse LCx dz, RCA mult overlapping stents w/ 95% osital stenosis, EF 20%, s/p DES to ostial/prox RCA 01/24/13   . Diabetes mellitus, type 2 ([place])   . GERD (gastroesophageal reflux disease)   . Hematoma of leg    a. left leg hematoma 03/2012 in the setting of asa/effient  . Herpes zoster ophthalmicus   . HYPERLIPIDEMIA    intolerant to Lipitor (myalgias)  . HYPERTENSION   . Ischemic cardiomyopathy    a. 01/2012 Echo EF 45%, mild LVH; b. WN46%, grade 1 diastolic dysfunction, diffuse hypokinesis, inferoposterior akinesis   . Noncompliance   . Obesity   . OSA (obstructive sleep apnea)   . Polymyalgia rheumatica ([place])     PAST SURGICAL HISTORY:   Past Surgical History:  Procedure Laterality Date  . CARDIAC  CATHETERIZATION  01/24/2013  . CARDIAC CATHETERIZATION N/A 11/14/2016   Procedure: Right/Left Heart Cath and Coronary Angiography;  Surgeon: Larey Dresser, MD;  Location: [facility];  Service: Cardiovascular;  Laterality: N/A;  . CARDIAC CATHETERIZATION N/A 11/17/2016   Procedure: Coronary Stent Intervention w/Impella;  Surgeon: Peter M Martinique, MD;  Location: [facility];  Service: Cardiovascular;  Laterality: N/A;  . CARDIAC CATHETERIZATION N/A 11/17/2016   Procedure: Coronary Atherectomy;  Surgeon: Peter M Martinique, MD;  Location: [facility];  Service: Cardiovascular;  Laterality: N/A;  .  CORONARY ANGIOPLASTY WITH STENT PLACEMENT  01/24/2013   DES to RCA  . CORONARY BALLOON ANGIOPLASTY N/A 07/06/2017   Procedure: Coronary Balloon Angioplasty;  Surgeon: Sherren Mocha, MD;  Location: [facility];  Service: Cardiovascular;  Laterality: N/A;  . CORONARY STENT PLACEMENT  2012   reports 6 stents placed  . I&D EXTREMITY  06/15/2012   Procedure: IRRIGATION AND DEBRIDEMENT EXTREMITY;  Surgeon: Newt Minion, MD;  Location: [facility];  Service: Orthopedics;  Laterality: Left;  I&D Left Posterior Knee  . I&D EXTREMITY  06/30/2012   Procedure: IRRIGATION AND DEBRIDEMENT EXTREMITY;  Surgeon: Newt Minion, MD;  Location: [facility];  Service: Orthopedics;  Laterality: Left;  Left Leg Irrigation and Debridement and placement of Wound VAC and application of  A-cell  . I&D EXTREMITY  07/20/2012   Procedure: IRRIGATION AND DEBRIDEMENT EXTREMITY;  Surgeon: Newt Minion, MD;  Location: [facility];  Service: Orthopedics;  Laterality: Left;  Irrigation and Debridement Left Leg and Place antibiotic beads   . ICD IMPLANT N/A 02/12/2017   Procedure: ICD Implant;  Surgeon: Evans Lance, MD;  Location: [facility];  Service: Cardiovascular;  Laterality: N/A;  . LEAD REVISION/REPAIR N/A 05/26/2017   Procedure: Lead Revision/Repair;  Surgeon: Evans Lance, MD;  Location: [facility];  Service:  Cardiovascular;  Laterality: N/A;  . LEFT HEART CATH AND CORONARY ANGIOGRAPHY N/A 07/06/2017   Procedure: Left Heart Cath and Coronary Angiography;  Surgeon: Larey Dresser, MD;  Location: [facility];  Service: Cardiovascular;  Laterality: N/A;  . PERCUTANEOUS CORONARY STENT INTERVENTION (PCI-S) N/A 01/24/2013   Procedure: PERCUTANEOUS CORONARY STENT INTERVENTION (PCI-S);  Surgeon: Wellington Hampshire, MD;  Location: [facility];  Service: Cardiovascular;  Laterality: N/A;  . PROSTATE SURGERY     cancer, seed implant  . TRANSURETHRAL RESECTION OF BLADDER TUMOR N/A 10/30/2017   Procedure: TRANSURETHRAL RESECTION OF BLADDER TUMOR (TURBT)/ INSTILLATION OF EPIRUBICIN;  Surgeon: Festus Aloe, MD;  Location: [facility];  Service: Urology;  Laterality: N/A;  . ULTRASOUND GUIDANCE FOR VASCULAR ACCESS  11/14/2016   Procedure: Ultrasound Guidance For Vascular Access;  Surgeon: Larey Dresser, MD;  Location: [facility];  Service: Cardiovascular;;  . Stephanie Coup ABLATION N/A 06/19/2017   Procedure: Stephanie Coup Ablation;  Surgeon: Evans Lance, MD;  Location: [place] [facility];  Service: Cardiovascular;  Laterality: N/A;    SOCIAL HISTORY:   Social History   Socioeconomic History  . Marital status: Married    Spouse name: Not on file  . Number of children: 1  . Years of education: Not on file  . Highest education level: Not on file  Occupational History  . Occupation: RETIRED    Employer: RETIRED    Comment: [employer]  . Financial resource strain: Not on file  . Food insecurity:    Worry: Not on file    Inability: Not on file  . Transportation needs:    Medical: Not on file    Non-medical: Not on file  Tobacco Use  . Smoking status: Former Smoker    Packs/day: 0.30    Years: 20.00    Pack years: 6.00    Types: Cigarettes    Last attempt to quit: 02/23/1989    Years since quitting: 29.0  . Smokeless tobacco: Never Used  Substance and Sexual Activity  . Alcohol  use: No  . Drug use: No  . Sexual activity: Not on  file  Lifestyle  . Physical activity:    Days per week: Not on file    Minutes per session: Not on file  . Stress: Not on file  Relationships  . Social connections:    Talks on phone: Not on file    Gets together: Not on file    Attends religious service: Not on file    Active member of club or organization: Not on file    Attends meetings of clubs or organizations: Not on file    Relationship status: Not on file  . Intimate partner violence:    Fear of current or ex partner: Not on file    Emotionally abused: Not on file    Physically abused: Not on file    Forced sexual activity: Not on file  Other Topics Concern  . Not on file  Social History Narrative   Retired Administrator    FAMILY HISTORY:   Family Status  Relation Name Status  . Mother  Deceased at age 61  . Father  Deceased at age 64       MI  . Brother 2 Alive  . Sister  Alive  . Son 1 Alive    ROS:  A complete 10 system review of systems was obtained and was unremarkable apart from what is mentioned above.  PHYSICAL EXAMINATION:    VITALS:   Vitals:   03/08/18 1002  BP: 102/70  Pulse: 70  SpO2: 97%  Weight: 235 lb (106.6 kg)  Height: 5\' 8"  (1.727 m)    GEN:  The patient appears stated age and is in NAD. HEENT:  Normocephalic, atraumatic.  The mucous membranes are moist. The superficial temporal arteries are without ropiness or tenderness. CV:  RRR Lungs:  CTAB Neck/HEME:  There are no carotid bruits bilaterally.  Neurological examination:  Orientation: The patient is alert and oriented x3. Fund of knowledge is appropriate.  Recent and remote memory are intact.  Attention and concentration are normal.    Able to name objects and repeat phrases. Cranial nerves: There is good facial symmetry. Pupils are equal round and reactive to light bilaterally. Fundoscopic exam reveals clear margins bilaterally. Extraocular muscles are intact. The visual  fields are full to confrontational testing. The speech is fluent and clear. Soft palate rises symmetrically and there is no tongue deviation. Hearing is intact to conversational tone. Sensation: Sensation is intact to light and pinprick throughout (facial, trunk, extremities).   Pinprick is decreased in the stocking distribution.  Vibration is absent at the bilateral big toe and ankle and decreased in the knee and even in the hands.  He has very poor proprioception at the bilateral big toe.  There is no extinction with double simultaneous stimulation. There is no sensory dermatomal level identified. Motor: Strength is 5/5 in the bilateral upper and lower extremities.   Shoulder shrug is equal and symmetric.  There is no pronator drift. Deep tendon reflexes: Deep tendon reflexes are 2-/4 at the bilateral biceps, triceps, brachioradialis, absent at the bilateral patella and achilles. Plantar responses are downgoing bilaterally.  Movement examination: Tone: There is normal tone in the bilateral upper extremities.  The tone in the lower extremities is normal.  Abnormal movements: No rest or intention tremor, even with distraction procedures. Coordination:  There is no decremation with RAM's, with any form of RAMS, including alternating supination and pronation of the forearm, hand opening and closing, finger taps, heel taps.  He does have trouble with toe taps bilaterally,  but it is really not decremation. Gait and Station: The patient has a wide based and antalgic gait.  He is unstable.  Labs: Lab Results  Component Value Date   HGBA1C 10.7 02/09/2018     Chemistry      Component Value Date/Time   NA 138 03/01/2018 1146   K 4.7 03/01/2018 1146   CL 103 03/01/2018 1146   CO2 25 03/01/2018 1146   BUN 22 (H) 03/01/2018 1146   CREATININE 1.41 (H) 03/01/2018 1146   CREATININE 0.78 06/30/2011 1702      Component Value Date/Time   CALCIUM 9.4 03/01/2018 1146   ALKPHOS 76 01/18/2018 1032   AST 26  01/18/2018 1032   ALT 26 01/18/2018 1032   BILITOT 0.8 01/18/2018 1032     No results found for: VITAMINB12   ASSESSMENT/PLAN:  1.  Gait imbalance  -likely mutlifactorial.  Does have low back pain and being seen by ortho for that.  The patient also has clinical examination evidence of a significant diffuse peripheral neuropathy, which certainly can affect gait and balance.  This is likely from DM with poor control.  We discussed safety associated with peripheral neuropathy.  We discussed balance therapy and the importance of ambulatory assistive device for balance assistance.  He does not wish to proceed with this, but will think about it.  He and I discussed the [employer] gym which I think would be beneficial for his balance.  He was given information on this.   I told him that I think that he needs a walker but he is not sure that he wants that.  Discussed benefits to this and discussed risks of not (hip fracture, hitting the head and resulting in cerebral hemorrhage).  He will think about more daily use of the cane, but is not so sure he is ready for a walker.  2.  Tremor  -I did tell the patient that while I did not see tremor today (he reports that it is intermittent and sometimes not disabling at all), I suspected that it was likely from the amiodarone.  I told the patient that about 1/3 of patients who are on amiodarone will have some degree of tremor.  He had worsening tremor when he was on higher dosages of the medication and it is somewhat better now.  I did not advise any medication for tremor.  3.  Memory Loss  -we discussed neurocognitive testing as this was very bothersome to him.  I do not think he has any evidence of a dementia.  I do think that pseudodementia from underlying mood change and medications could contribute.  He will think about the testing.  He will let me know if he wishes to proceed.  4.  He will follow-up with me on an as-needed basis.  Much greater than 50% of this  visit was spent in counseling and coordinating care.  Total face to face time:  60 min    Cc:  [name], MD

## 2018-03-08 ENCOUNTER — Encounter: Payer: Self-pay | Admitting: Neurology

## 2018-03-08 ENCOUNTER — Ambulatory Visit (INDEPENDENT_AMBULATORY_CARE_PROVIDER_SITE_OTHER): Payer: Medicare Other | Admitting: Neurology

## 2018-03-08 VITALS — BP 102/70 | HR 70 | Ht 68.0 in | Wt 235.0 lb

## 2018-03-08 DIAGNOSIS — I2510 Atherosclerotic heart disease of native coronary artery without angina pectoris: Secondary | ICD-10-CM

## 2018-03-08 DIAGNOSIS — E1142 Type 2 diabetes mellitus with diabetic polyneuropathy: Secondary | ICD-10-CM | POA: Diagnosis not present

## 2018-03-08 DIAGNOSIS — G251 Drug-induced tremor: Secondary | ICD-10-CM

## 2018-03-08 DIAGNOSIS — R413 Other amnesia: Secondary | ICD-10-CM | POA: Diagnosis not present

## 2018-03-08 NOTE — Patient Instructions (Signed)
1. Let us know if you are interested in memory testing or physical therapy.

## 2018-03-10 ENCOUNTER — Ambulatory Visit (INDEPENDENT_AMBULATORY_CARE_PROVIDER_SITE_OTHER): Payer: Medicare Other | Admitting: Internal Medicine

## 2018-03-10 ENCOUNTER — Telehealth: Payer: Self-pay | Admitting: Cardiology

## 2018-03-10 ENCOUNTER — Encounter: Payer: Self-pay | Admitting: Internal Medicine

## 2018-03-10 ENCOUNTER — Ambulatory Visit (INDEPENDENT_AMBULATORY_CARE_PROVIDER_SITE_OTHER): Payer: Medicare Other | Admitting: *Deleted

## 2018-03-10 VITALS — BP 124/76 | HR 74 | Ht 68.0 in | Wt 235.0 lb

## 2018-03-10 DIAGNOSIS — I472 Ventricular tachycardia, unspecified: Secondary | ICD-10-CM

## 2018-03-10 DIAGNOSIS — Z95810 Presence of automatic (implantable) cardiac defibrillator: Secondary | ICD-10-CM

## 2018-03-10 DIAGNOSIS — I480 Paroxysmal atrial fibrillation: Secondary | ICD-10-CM | POA: Diagnosis not present

## 2018-03-10 DIAGNOSIS — I5022 Chronic systolic (congestive) heart failure: Secondary | ICD-10-CM

## 2018-03-10 DIAGNOSIS — I2510 Atherosclerotic heart disease of native coronary artery without angina pectoris: Secondary | ICD-10-CM

## 2018-03-10 LAB — CUP PACEART INCLINIC DEVICE CHECK
Battery Remaining Longevity: 117 mo
Brady Statistic RV Percent Paced: 0.01 %
HIGH POWER IMPEDANCE MEASURED VALUE: 75 Ohm
Lead Channel Impedance Value: 437 Ohm
Lead Channel Impedance Value: 494 Ohm
Lead Channel Sensing Intrinsic Amplitude: 14.125 mV
Lead Channel Sensing Intrinsic Amplitude: 14.375 mV
Lead Channel Setting Pacing Pulse Width: 0.4 ms
Lead Channel Setting Sensing Sensitivity: 0.3 mV
MDC IDC LEAD IMPLANT DT: 20180612
MDC IDC LEAD LOCATION: 753860
MDC IDC MSMT BATTERY VOLTAGE: 3.01 V
MDC IDC MSMT LEADCHNL RV PACING THRESHOLD AMPLITUDE: 1 V
MDC IDC MSMT LEADCHNL RV PACING THRESHOLD PULSEWIDTH: 0.4 ms
MDC IDC PG IMPLANT DT: 20180103
MDC IDC SESS DTM: 20190327182925
MDC IDC SET LEADCHNL RV PACING AMPLITUDE: 2.5 V

## 2018-03-10 MED ORDER — AMIODARONE HCL 200 MG PO TABS: ORAL_TABLET | ORAL | 3 refills | Status: DC

## 2018-03-10 NOTE — Telephone Encounter (Signed)
LMOVM reminding pt to send remote transmission.   

## 2018-03-10 NOTE — Progress Notes (Signed)
HPI Wayne [name] returns today for follow-up.  He is a very pleasant 77 year old man with an extensive past medical history including chronic systolic heart failure and an ischemic cardiomyopathy.  The patient has a history of ventricular fibrillation cardiac arrest with prolonged downtime as his ICD under sensed his ventricular fibrillation.  The patient has a history of chronic systolic heart failure but his symptoms are currently improved.  Since beginning amiodarone, he has developed a slight tremor.  He denies chest pain.  He has class II heart failure symptoms. Allergies  Allergen Reactions  . Entresto [Sacubitril-Valsartan] Swelling and Other (See Comments)    Angioedema  . Crestor [Rosuvastatin Calcium] Other (See Comments)    Myalgia, Interfering with Gait  . Lipitor [Atorvastatin] Other (See Comments)    Makes legs sore  . Plavix [Clopidogrel] Itching, Swelling and Other (See Comments)    Nose bleeds and welts on legs & back  . Ancef [Cefazolin] Itching, Rash and Other (See Comments)    Describes itching and rash, but said that "it wasn't that bad"     Current Outpatient Medications  Medication Sig Dispense Refill  . ACCU-CHEK AVIVA PLUS test strip CHECK BLOOD SUGAR ONCE TO TWICE DAILY. 200 each 0  . carvedilol (COREG) 12.5 MG tablet Take 1 tablet (12.5 mg total) by mouth 2 (two) times daily. 60 tablet 6  . diphenhydramine-acetaminophen (TYLENOL PM) 25-500 MG TABS tablet Take 1 tablet at bedtime as needed by mouth (for sleep).     Marland Kitchen doxycycline (VIBRAMYCIN) 100 MG capsule Take 1 capsule (100 mg total) by mouth 2 (two) times daily. 20 capsule 0  . furosemide (LASIX) 20 MG tablet Take 1 tablet (20 mg total) by mouth 2 (two) times daily. 30 tablet 6  . insulin degludec (TRESIBA FLEXTOUCH) 100 UNIT/ML SOPN FlexTouch Pen Inject 10 Units into the skin daily at 10 pm.    . losartan (COZAAR) 25 MG tablet Take 1 tablet (25 mg total) by mouth 2 (two) times daily. 60 tablet 3  .  Multiple Vitamins-Minerals (CENTRUM SILVER ADULT 50+) TABS Take 1 tablet by mouth daily.    . Omega-3 Fatty Acids (FISH OIL) 1000 MG CAPS Take 1,000 mg by mouth 2 (two) times daily.     . Patiromer Sorbitex Calcium (VELTASSA) 16.8 g PACK Take by mouth.    . rivaroxaban (XARELTO) 20 MG TABS tablet Take 1 tablet (20 mg total) by mouth daily with supper. 30 tablet 3  . rosuvastatin (CRESTOR) 10 MG tablet Take 1 tablet (10 mg total) by mouth daily. 30 tablet 11  . spironolactone (ALDACTONE) 25 MG tablet Take 0.5 tablets (12.5 mg total) by mouth every evening. 15 tablet 6  . TRUEPLUS LANCETS 30G MISC CHECK BLOOD SUGAR ONCE TO TWICE DAILY 200 each 0  . amiodarone (PACERONE) 200 MG tablet Take one tablet by mouth Monday through Saturday.  Do NOT take on Sunday. 90 tablet 3   No current facility-administered medications for this visit.    Facility-Administered Medications Ordered in Other Visits  Medication Dose Route Frequency Provider Last Rate Last Dose  . epirubicin (ELLENCE) 50 mg in sodium chloride 0.9 % bladder instillation  50 mg Bladder Instillation Once Festus Aloe, MD         Past Medical History:  Diagnosis Date  . Adenocarcinoma of prostate ([place])    s/p seed implants  . Arthritis   . Atrial fibrillation ([place])   . Cellulitis of left leg    a.  03/5408 complicated by septic shock  . Chronic combined systolic and diastolic CHF (congestive heart failure) ([place])   . Colon polyps   . CORONARY ATHEROSCLEROSIS NATIVE CORONARY ARTERY    a. 01/2011 Cath/PCI: LM nl, LAD 40-50p, D1 80-small, LCX 95-small, RI 90, RCA 100, EF 20%;  b. 01/2011 Card MRI - No transmural scar;  c. 01/2011 PCI RCA->5 Promus DES, RI->3.0x16 Promus DES; d. Cath 01/13/13 patent LAD & Ramus, diffuse LCx dz, RCA mult overlapping stents w/ 95% osital stenosis, EF 20%, s/p DES to ostial/prox RCA 01/24/13   . Diabetes mellitus, type 2 ([place])   . GERD (gastroesophageal reflux disease)   . Hematoma of leg    a. left leg  hematoma 03/2012 in the setting of asa/effient  . Herpes zoster ophthalmicus   . HYPERLIPIDEMIA    intolerant to Lipitor (myalgias)  . HYPERTENSION   . Ischemic cardiomyopathy    a. 01/2012 Echo EF 45%, mild LVH; b. WJ19%, grade 1 diastolic dysfunction, diffuse hypokinesis, inferoposterior akinesis   . Noncompliance   . Obesity   . OSA (obstructive sleep apnea)   . Polymyalgia rheumatica (H[facility])     ROS:   All systems reviewed and negative except as noted in the HPI.   Past Surgical History:  Procedure Laterality Date  . CARDIAC CATHETERIZATION  01/24/2013  . CARDIAC CATHETERIZATION N/A 11/14/2016   Procedure: Right/Left Heart Cath and Coronary Angiography;  Surgeon: Larey Dresser, MD;  Location: [facility];  Service: Cardiovascular;  Laterality: N/A;  . CARDIAC CATHETERIZATION N/A 11/17/2016   Procedure: Coronary Stent Intervention w/Impella;  Surgeon: Peter M Martinique, MD;  Location: [facility];  Service: Cardiovascular;  Laterality: N/A;  . CARDIAC CATHETERIZATION N/A 11/17/2016   Procedure: Coronary Atherectomy;  Surgeon: Peter M Martinique, MD;  Location: [facility];  Service: Cardiovascular;  Laterality: N/A;  . CORONARY ANGIOPLASTY WITH STENT PLACEMENT  01/24/2013   DES to RCA  . CORONARY BALLOON ANGIOPLASTY N/A 07/06/2017   Procedure: Coronary Balloon Angioplasty;  Surgeon: Sherren Mocha, MD;  Location: [facility];  Service: Cardiovascular;  Laterality: N/A;  . CORONARY STENT PLACEMENT  2012   reports 6 stents placed  . I&D EXTREMITY  06/15/2012   Procedure: IRRIGATION AND DEBRIDEMENT EXTREMITY;  Surgeon: Newt Minion, MD;  Location: [facility];  Service: Orthopedics;  Laterality: Left;  I&D Left Posterior Knee  . I&D EXTREMITY  06/30/2012   Procedure: IRRIGATION AND DEBRIDEMENT EXTREMITY;  Surgeon: Newt Minion, MD;  Location: [facility];  Service: Orthopedics;  Laterality: Left;  Left Leg Irrigation and Debridement and placement of Wound VAC and application of   A-cell  . I&D EXTREMITY  07/20/2012   Procedure: IRRIGATION AND DEBRIDEMENT EXTREMITY;  Surgeon: Newt Minion, MD;  Location: [facility];  Service: Orthopedics;  Laterality: Left;  Irrigation and Debridement Left Leg and Place antibiotic beads   . ICD IMPLANT N/A 02/12/2017   Procedure: ICD Implant;  Surgeon: Evans Lance, MD;  Location: [facility];  Service: Cardiovascular;  Laterality: N/A;  . LEAD REVISION/REPAIR N/A 05/26/2017   Procedure: Lead Revision/Repair;  Surgeon: Evans Lance, MD;  Location: [facility];  Service: Cardiovascular;  Laterality: N/A;  . LEFT HEART CATH AND CORONARY ANGIOGRAPHY N/A 07/06/2017   Procedure: Left Heart Cath and Coronary Angiography;  Surgeon: Larey Dresser, MD;  Location: [facility];  Service: Cardiovascular;  Laterality: N/A;  . PERCUTANEOUS CORONARY STENT INTERVENTION (PCI-S)  N/A 01/24/2013   Procedure: PERCUTANEOUS CORONARY STENT INTERVENTION (PCI-S);  Surgeon: Wellington Hampshire, MD;  Location: [facility];  Service: Cardiovascular;  Laterality: N/A;  . PROSTATE SURGERY     cancer, seed implant  . TRANSURETHRAL RESECTION OF BLADDER TUMOR N/A 10/30/2017   Procedure: TRANSURETHRAL RESECTION OF BLADDER TUMOR (TURBT)/ INSTILLATION OF EPIRUBICIN;  Surgeon: Festus Aloe, MD;  Location: [facility];  Service: Urology;  Laterality: N/A;  . ULTRASOUND GUIDANCE FOR VASCULAR ACCESS  11/14/2016   Procedure: Ultrasound Guidance For Vascular Access;  Surgeon: Larey Dresser, MD;  Location: [facility];  Service: Cardiovascular;;  . Stephanie Coup ABLATION N/A 06/19/2017   Procedure: Stephanie Coup Ablation;  Surgeon: Evans Lance, MD;  Location: [facility];  Service: Cardiovascular;  Laterality: N/A;     Family History  Problem Relation Age of Onset  . Cancer Mother 64       unknown CA  . Heart disease Father   . Alcohol abuse Father   . Heart attack Father 61  . Heart attack Brother      Social History   Socioeconomic History  .  Marital status: Married    Spouse name: Not on file  . Number of children: 1  . Years of education: Not on file  . Highest education level: Not on file  Occupational History  . Occupation: RETIRED    Employer: RETIRED    Comment: [employer]  . Financial resource strain: Not on file  . Food insecurity:    Worry: Not on file    Inability: Not on file  . Transportation needs:    Medical: Not on file    Non-medical: Not on file  Tobacco Use  . Smoking status: Former Smoker    Packs/day: 0.30    Years: 20.00    Pack years: 6.00    Types: Cigarettes    Last attempt to quit: 02/23/1989    Years since quitting: 29.0  . Smokeless tobacco: Never Used  Substance and Sexual Activity  . Alcohol use: No  . Drug use: No  . Sexual activity: Not on file  Lifestyle  . Physical activity:    Days per week: Not on file    Minutes per session: Not on file  . Stress: Not on file  Relationships  . Social connections:    Talks on phone: Not on file    Gets together: Not on file    Attends religious service: Not on file    Active member of club or organization: Not on file    Attends meetings of clubs or organizations: Not on file    Relationship status: Not on file  . Intimate partner violence:    Fear of current or ex partner: Not on file    Emotionally abused: Not on file    Physically abused: Not on file    Forced sexual activity: Not on file  Other Topics Concern  . Not on file  Social History Narrative   Retired Administrator     BP 469/62   Pulse 74   Ht 5\' 8"  (1.727 m)   Wt 235 lb (106.6 kg)   BMI 35.73 kg/m   Physical Exam:  Well appearing 77 year old man, NAD HEENT: Unremarkable Neck:  No JVD, no thyromegally Lymphatics:  No adenopathy Back:  No CVA tenderness Lungs:  Clear, with minimal rales in the bases but no wheezes or rhonchi. HEART:  Regular rate rhythm, no  murmurs, no rubs, no clicks Abd:  soft, positive bowel sounds, no organomegally, no  rebound, no guarding Ext:  2 plus pulses, no edema, no cyanosis, no clubbing Skin:  No rashes no nodules Neuro:  CN II through XII intact, motor grossly intact  EKG -normal sinus rhythm with intraventricular conduction delay, old inferior and anteroseptal/anterior MI pattern  DEVICE  Normal device function.  See PaceArt for details.   Assess/Plan: 1.  Ventricular fibrillation -he has had no recurrent ICD therapies.  He will continue amiodarone 200 mg a day but we will reduce the dose on Sunday taking amiodarone just 6 days a week. 2.  Tremor-etiology is unclear but certainly amiodarone could be causative.  I recommended the patient reduce his dose to 1200 mg a week. 3.  Chronic systolic heart failure -he is followed in our heart failure clinic and his symptoms are currently class II on maximal medical therapy. 4.  Coronary artery disease -he is status post multiple interventions.  He denies anginal symptoms. 5.  ICD -interrogation of his Medtronic device demonstrates no ventricular arrhythmias for approximately 8 months.  We will plan to recheck his device in several months.  Crissie Sickles, MD  Crissie Sickles, MD

## 2018-03-10 NOTE — Patient Instructions (Addendum)
Medication Instructions:  Your physician has recommended you make the following change in your medication: 1.  Reduce your amiodarone to 200 mg one tablet daily Monday through Saturday.  Do not take your amiodarone on Sunday.  Labwork: None ordered.  Testing/Procedures: None ordered.  Follow-Up: Your physician wants you to follow-up in: one year with Dr. Lovena Le.   You will receive a reminder letter in the mail two months in advance. If you don't receive a letter, please call our office to schedule the follow-up appointment.  Remote monitoring is used to monitor your ICD from home. This monitoring reduces the number of office visits required to check your device to one time per year. It allows [place] to keep an eye on the functioning of your device to ensure it is working properly. You are scheduled for a device check from home on 06/09/2018. You may send your transmission at any time that day. If you have a wireless device, the transmission will be sent automatically. After your physician reviews your transmission, you will receive a postcard with your next transmission date.  Any Other Special Instructions Will Be Listed Below (If Applicable).  If you need a refill on your cardiac medications before your next appointment, please call your pharmacy.

## 2018-03-12 ENCOUNTER — Encounter: Payer: Self-pay | Admitting: Cardiology

## 2018-03-12 ENCOUNTER — Other Ambulatory Visit (HOSPITAL_COMMUNITY): Payer: Self-pay | Admitting: *Deleted

## 2018-03-12 DIAGNOSIS — I5022 Chronic systolic (congestive) heart failure: Secondary | ICD-10-CM

## 2018-03-12 MED ORDER — CARVEDILOL 12.5 MG PO TABS
12.5000 mg | ORAL_TABLET | Freq: Two times a day (BID) | ORAL | 6 refills | Status: DC
Start: 2018-03-12 — End: 2019-02-10

## 2018-03-19 ENCOUNTER — Telehealth: Payer: Self-pay | Admitting: Internal Medicine

## 2018-03-19 LAB — CUP PACEART REMOTE DEVICE CHECK
Brady Statistic RV Percent Paced: 0 %
Date Time Interrogation Session: 20190330185559
HIGH POWER IMPEDANCE MEASURED VALUE: 79 Ohm
Implantable Lead Implant Date: 20180612
Implantable Lead Model: 6935
Lead Channel Impedance Value: 399 Ohm
Lead Channel Impedance Value: 494 Ohm
Lead Channel Pacing Threshold Amplitude: 1 V
Lead Channel Sensing Intrinsic Amplitude: 13.5 mV
Lead Channel Sensing Intrinsic Amplitude: 13.5 mV
Lead Channel Setting Pacing Amplitude: 2.5 V
MDC IDC LEAD LOCATION: 753860
MDC IDC MSMT BATTERY REMAINING LONGEVITY: 117 mo
MDC IDC MSMT BATTERY VOLTAGE: 2.99 V
MDC IDC MSMT LEADCHNL RV PACING THRESHOLD PULSEWIDTH: 0.4 ms
MDC IDC PG IMPLANT DT: 20180103
MDC IDC SET LEADCHNL RV PACING PULSEWIDTH: 0.4 ms
MDC IDC SET LEADCHNL RV SENSING SENSITIVITY: 0.3 mV

## 2018-03-19 NOTE — Telephone Encounter (Signed)
Spoke with patient who stated that he sent a remote 3/30 as he was seen in the office 3/27. We will process the 3/30 for his 3/27 remote. Patient was agreeable to this plan.

## 2018-03-19 NOTE — Telephone Encounter (Signed)
Wayne [name] is calling about a letter he received about his transmission . Please Call   Thanks

## 2018-03-19 NOTE — Telephone Encounter (Signed)
New message    Did you receive the transmission ? He sent it twice today

## 2018-03-22 ENCOUNTER — Encounter: Payer: Self-pay | Admitting: Cardiology

## 2018-03-22 NOTE — Progress Notes (Signed)
Remote ICD transmission.   

## 2018-03-24 ENCOUNTER — Telehealth: Payer: Self-pay | Admitting: Family Medicine

## 2018-03-24 NOTE — Telephone Encounter (Signed)
Patient called about his request to be put back on Glimepiride for his blood sugars. He says "since I was in the office the last time and started taking this insulin, my blood sugars have been in the 300's and I don't think it works as good as the Glimepiride."  I asked him for his blood sugar readings the past 2 mornings fasting, he says "yesterday it was 403 and this morning it was 321."  I asked him how much of the Tyler Aas is he taking, he says "2 units every night." I advised that according to the last OV note, he was to be started on 10 units every night and if his fasting morning blood sugars are over 130, he is supposed to increase the dosage by 2 units every three days until the fasting blood sugar is less than 130 and to check fasting blood sugars every morning.  I explained how that would look..12units tonight.  On day 3 if the blood sugar is over 130, give himself 14 units that night and so forth. He says "I understood the nurse to say 2 units every night, so that's what I have been doing since February. Do you have samples in the office, because I hear that insulin is expensive and I don't want to purchase it until I know that is the only option. I would like to try the Glimepiride again and increase the dosage more than 4 mg. I didn't take it regularly, but when I did, my blood sugars were a whole lot better than they are now.  I would like to take that and not use the insulin, because I can do better with the pills than the insulin."  I advised this would be sent over to Dr. Elease Hashimoto for review and someone will call him back with Dr. Erick Blinks recommendation. Patient verbalized understanding.

## 2018-03-24 NOTE — Telephone Encounter (Signed)
Copied from [place] [id number]. Topic: Quick Communication - Rx Refill/Question >> Mar 24, 2018 10:06 AM Neva Seat wrote: Pt was recently prescribed a new diabetes Rx, but it isn't working.  Pt wakes up w/ BS in the 300's. Asking for old BS Rx - Glimepiride - dosage be increased because it helped control his BS levels.

## 2018-03-28 NOTE — Telephone Encounter (Signed)
His pt d/c notes from February visit clearly state to start 10 units of Tresiba and increase by 2 units every 3 days until fasting sugars in ballpark of 130. He should go ahead and do this and also set up follow up so we can re-enforce.

## 2018-03-29 ENCOUNTER — Telehealth (HOSPITAL_COMMUNITY): Payer: Self-pay | Admitting: Pharmacist

## 2018-03-29 NOTE — Telephone Encounter (Signed)
Pt called back and gave message to pick up sample of Tresiba. Pt stated he will come by tomorrow am to pick up sample.

## 2018-03-29 NOTE — Telephone Encounter (Signed)
Left message on machine for patient to return our call.  Sample also available for pick up.  CRM created  Samples of this drug were given to the patient,1 pen quantity Tresiba 100 units, Lot Number [id number] exp 07/2019

## 2018-03-29 NOTE — Telephone Encounter (Signed)
[employer] patient assistance approved for Xarelto 20 mg daily through 03/17/19.   Ruta Hinds. Velva Harman, PharmD, BCPS, CPP Clinical Pharmacist Phone: [phone] 03/29/2018 9:37 AM

## 2018-04-01 ENCOUNTER — Other Ambulatory Visit (HOSPITAL_COMMUNITY): Payer: Self-pay | Admitting: Pharmacist

## 2018-04-01 MED ORDER — ROSUVASTATIN CALCIUM 10 MG PO TABS: 10.0000 mg | ORAL_TABLET | Freq: Every day | ORAL | 11 refills | Status: DC

## 2018-04-09 ENCOUNTER — Other Ambulatory Visit: Payer: Self-pay | Admitting: Internal Medicine

## 2018-04-19 ENCOUNTER — Encounter (INDEPENDENT_AMBULATORY_CARE_PROVIDER_SITE_OTHER): Payer: Self-pay | Admitting: Physical Medicine and Rehabilitation

## 2018-04-19 ENCOUNTER — Telehealth: Payer: Self-pay | Admitting: *Deleted

## 2018-04-19 NOTE — Progress Notes (Signed)
  Numeric Pain Rating Scale and Functional Assessment Average Pain 6 Pain Right Now 2 My pain is intermittent and aching Pain is worse with: walking Pain improves with: rest   In the last MONTH (on 0-10 scale) has pain interfered with the following?  1. General activity like being  able to carry out your everyday physical activities such as walking, climbing stairs, carrying groceries, or moving a chair?  Rating(9)  2. Relation with others like being able to carry out your usual social activities and roles such as  activities at home, at work and in your community. Rating(6)  3. Enjoyment of life such that you have  been bothered by emotional problems such as feeling anxious, depressed or irritable?  Rating(2)

## 2018-04-19 NOTE — Telephone Encounter (Signed)
Copied from [place] [id number]. Topic: Inquiry >> Apr 16, 2018  4:18 PM Wayne [name] wrote: Reason for CRM:   Pt is asking if office has samples of insulin degludec (TRESIBA FLEXTOUCH) 100 UNIT/ML SOPN FlexTouch Pen   Samples of this drug were given to the patient, quantity 2 pen [place] , Lot Number [id number] Exp 05/2019

## 2018-04-23 ENCOUNTER — Other Ambulatory Visit (HOSPITAL_COMMUNITY): Payer: Self-pay | Admitting: *Deleted

## 2018-04-23 MED ORDER — RIVAROXABAN 20 MG PO TABS: 20.0000 mg | ORAL_TABLET | Freq: Every day | ORAL | 3 refills | Status: DC

## 2018-04-26 ENCOUNTER — Other Ambulatory Visit (HOSPITAL_COMMUNITY): Payer: Medicare Other

## 2018-04-26 ENCOUNTER — Encounter (HOSPITAL_COMMUNITY): Payer: Medicare Other | Admitting: Cardiology

## 2018-04-26 DIAGNOSIS — D414 Neoplasm of uncertain behavior of bladder: Secondary | ICD-10-CM | POA: Diagnosis not present

## 2018-05-11 ENCOUNTER — Ambulatory Visit (INDEPENDENT_AMBULATORY_CARE_PROVIDER_SITE_OTHER): Payer: Medicare Other | Admitting: Family Medicine

## 2018-05-11 ENCOUNTER — Encounter: Payer: Self-pay | Admitting: Family Medicine

## 2018-05-11 ENCOUNTER — Telehealth: Payer: Self-pay | Admitting: Internal Medicine

## 2018-05-11 VITALS — BP 102/64 | HR 75 | Temp 98.5°F | Wt 238.0 lb

## 2018-05-11 DIAGNOSIS — I2510 Atherosclerotic heart disease of native coronary artery without angina pectoris: Secondary | ICD-10-CM

## 2018-05-11 DIAGNOSIS — E11649 Type 2 diabetes mellitus with hypoglycemia without coma: Secondary | ICD-10-CM

## 2018-05-11 LAB — POCT GLYCOSYLATED HEMOGLOBIN (HGB A1C): Hemoglobin A1C: 10.7 % — AB (ref 4.0–5.6)

## 2018-05-11 NOTE — Progress Notes (Signed)
Samples of this drug were given to the patient, quantity 2 pens, Lot Number [id number], Exp 09/2019 Tyler Aas 100u/ml

## 2018-05-11 NOTE — Patient Instructions (Signed)
Check fasting sugars DAILY  Increase Tresiba by 2 units every 2 days until fasting sugars are consistently < 130.

## 2018-05-11 NOTE — Progress Notes (Signed)
Subjective:     Patient ID: Wayne [name], male   DOB: 16-Feb-1941, 77 y.o.   MRN: [id number]  HPI Patient here for follow-up type 2 diabetes. History of poor control history of poor compliance. He has declined diabetes educator in the past. He states he's had some continued poor compliance with diet. He brings in his home meter and 30 day average blood sugar 233. We placed him on insulin. He has history of severe systolic failure and is not a good candidate for metformin or sulfonylurea's. He has diminished GFR and is not a good candidate for SGLT 2 class.  He is currently on Tresiba 10 units once daily. We have previously discussed titrating this up but he has not done so.  Past Medical History:  Diagnosis Date  . Adenocarcinoma of prostate ([place])    s/p seed implants  . Arthritis   . Atrial fibrillation ([place])   . Cellulitis of left leg    a. 12/930 complicated by septic shock  . Chronic combined systolic and diastolic CHF (congestive heart failure) ([place])   . Colon polyps   . CORONARY ATHEROSCLEROSIS NATIVE CORONARY ARTERY    a. 01/2011 Cath/PCI: LM nl, LAD 40-50p, D1 80-small, LCX 95-small, RI 90, RCA 100, EF 20%;  b. 01/2011 Card MRI - No transmural scar;  c. 01/2011 PCI RCA->5 Promus DES, RI->3.0x16 Promus DES; d. Cath 01/13/13 patent LAD & Ramus, diffuse LCx dz, RCA mult overlapping stents w/ 95% osital stenosis, EF 20%, s/p DES to ostial/prox RCA 01/24/13   . Diabetes mellitus, type 2 ([place])   . GERD (gastroesophageal reflux disease)   . Hematoma of leg    a. left leg hematoma 03/2012 in the setting of asa/effient  . Herpes zoster ophthalmicus   . HYPERLIPIDEMIA    intolerant to Lipitor (myalgias)  . HYPERTENSION   . Ischemic cardiomyopathy    a. 01/2012 Echo EF 45%, mild LVH; b. TF57%, grade 1 diastolic dysfunction, diffuse hypokinesis, inferoposterior akinesis   . Noncompliance   . Obesity   . OSA (obstructive sleep apnea)   . Polymyalgia rheumatica (H[facility])    Past Surgical History:   Procedure Laterality Date  . CARDIAC CATHETERIZATION  01/24/2013  . CARDIAC CATHETERIZATION N/A 11/14/2016   Procedure: Right/Left Heart Cath and Coronary Angiography;  Surgeon: Larey Dresser, MD;  Location: [place] [facility];  Service: Cardiovascular;  Laterality: N/A;  . CARDIAC CATHETERIZATION N/A 11/17/2016   Procedure: Coronary Stent Intervention w/Impella;  Surgeon: Peter M Martinique, MD;  Location: [place] [facility];  Service: Cardiovascular;  Laterality: N/A;  . CARDIAC CATHETERIZATION N/A 11/17/2016   Procedure: Coronary Atherectomy;  Surgeon: Peter M Martinique, MD;  Location: [place] [facility];  Service: Cardiovascular;  Laterality: N/A;  . CORONARY ANGIOPLASTY WITH STENT PLACEMENT  01/24/2013   DES to RCA  . CORONARY BALLOON ANGIOPLASTY N/A 07/06/2017   Procedure: Coronary Balloon Angioplasty;  Surgeon: Sherren Mocha, MD;  Location: [place] [facility];  Service: Cardiovascular;  Laterality: N/A;  . CORONARY STENT PLACEMENT  2012   reports 6 stents placed  . I&D EXTREMITY  06/15/2012   Procedure: IRRIGATION AND DEBRIDEMENT EXTREMITY;  Surgeon: Newt Minion, MD;  Location: [facility];  Service: Orthopedics;  Laterality: Left;  I&D Left Posterior Knee  . I&D EXTREMITY  06/30/2012   Procedure: IRRIGATION AND DEBRIDEMENT EXTREMITY;  Surgeon: Newt Minion, MD;  Location: [place];  Service: Orthopedics;  Laterality: Left;  Left Leg Irrigation and Debridement and placement of  Wound VAC and application of  A-cell  . I&D EXTREMITY  07/20/2012   Procedure: IRRIGATION AND DEBRIDEMENT EXTREMITY;  Surgeon: Newt Minion, MD;  Location: [facility];  Service: Orthopedics;  Laterality: Left;  Irrigation and Debridement Left Leg and Place antibiotic beads   . ICD IMPLANT N/A 02/12/2017   Procedure: ICD Implant;  Surgeon: Evans Lance, MD;  Location: [place] [facility];  Service: Cardiovascular;  Laterality: N/A;  . LEAD REVISION/REPAIR N/A 05/26/2017   Procedure: Lead Revision/Repair;  Surgeon: Evans Lance, MD;   Location: [place] [facility];  Service: Cardiovascular;  Laterality: N/A;  . LEFT HEART CATH AND CORONARY ANGIOGRAPHY N/A 07/06/2017   Procedure: Left Heart Cath and Coronary Angiography;  Surgeon: Larey Dresser, MD;  Location: [place] [facility];  Service: Cardiovascular;  Laterality: N/A;  . PERCUTANEOUS CORONARY STENT INTERVENTION (PCI-S) N/A 01/24/2013   Procedure: PERCUTANEOUS CORONARY STENT INTERVENTION (PCI-S);  Surgeon: Wellington Hampshire, MD;  Location: [facility] C[facility];  Service: Cardiovascular;  Laterality: N/A;  . PROSTATE SURGERY     cancer, seed implant  . TRANSURETHRAL RESECTION OF BLADDER TUMOR N/A 10/30/2017   Procedure: TRANSURETHRAL RESECTION OF BLADDER TUMOR (TURBT)/ INSTILLATION OF EPIRUBICIN;  Surgeon: Festus Aloe, MD;  Location: [facility];  Service: Urology;  Laterality: N/A;  . ULTRASOUND GUIDANCE FOR VASCULAR ACCESS  11/14/2016   Procedure: Ultrasound Guidance For Vascular Access;  Surgeon: Larey Dresser, MD;  Location: [place] [facility];  Service: Cardiovascular;;  . Stephanie Coup ABLATION N/A 06/19/2017   Procedure: Stephanie Coup Ablation;  Surgeon: Evans Lance, MD;  Location: [place] [facility];  Service: Cardiovascular;  Laterality: N/A;    reports that he quit smoking about 29 years ago. His smoking use included cigarettes. He has a 6.00 pack-year smoking history. He has never used smokeless tobacco. He reports that he does not drink alcohol or use drugs. family history includes Alcohol abuse in his father; Cancer (age of onset: 42) in his mother; Heart attack in his brother; Heart attack (age of onset: 3) in his father; Heart disease in his father. Allergies  Allergen Reactions  . Entresto [Sacubitril-Valsartan] Swelling and Other (See Comments)    Angioedema  . Crestor [Rosuvastatin Calcium] Other (See Comments)    Myalgia, Interfering with Gait  . Lipitor [Atorvastatin] Other (See Comments)    Makes legs sore  . Plavix [Clopidogrel] Itching, Swelling and Other (See  Comments)    Nose bleeds and welts on legs & back  . Ancef [Cefazolin] Itching, Rash and Other (See Comments)    Describes itching and rash, but said that "it wasn't that bad"     Review of Systems  Constitutional: Negative for fatigue and unexpected weight change.  Eyes: Negative for visual disturbance.  Respiratory: Positive for shortness of breath (chronic and unchanged). Negative for cough and chest tightness.   Cardiovascular: Negative for chest pain, palpitations and leg swelling.  Endocrine: Negative for polydipsia and polyuria.  Neurological: Negative for dizziness, syncope, weakness, light-headedness and headaches.       Objective:   Physical Exam  Constitutional: He is oriented to person, place, and time. He appears well-developed and well-nourished.  Cardiovascular: Normal rate.  Pulmonary/Chest: Effort normal and breath sounds normal. He has no wheezes. He has no rales.  Musculoskeletal: He exhibits no edema.  Neurological: He is alert and oriented to person, place, and time.       Assessment:     Type 2 diabetes poorly controlled  with A1c today 10.7%. Patient has history of poor compliance with diet and medications    Plan:     -Check fasting blood sugars daily and increased tresiba 2 units every couple days until fasting blood sugars around 130 or less -We discussed the fact that we will probably need to use short acting insulin in combination to improve control long-term -Is not a good candidate for metformin or sulfonylureas because of his severe heart failure history -Not a good candidate for SGLT 2 class because of impaired GFR -Schedule follow-up in 3 months to reassess  [name] MD [facility]

## 2018-05-11 NOTE — Telephone Encounter (Signed)
New Message:          [facility] Pre-operative Risk Assessment    Request for surgical clearance:  1. What type of surgery is being performed? Cystocopy bladder biopsy/fulguration with post op epirubicin  2. When is this surgery scheduled? TBD  3. What type of clearance is required (medical clearance vs. Pharmacy clearance to hold med vs. Both)? Pharmacy  4. Are there any medications that need to be held prior to surgery and how long? rivaroxaban (XARELTO) 20 MG TABS tablet hold 2-3 days prior to procedure  5. Practice name and name of physician performing surgery? Dr. Rodman Key [facility]/ [facility]  6. What is your office phone number [phone]   7.   What is your office fax number [phone]  8.   Anesthesia type (None, local, MAC, general) ? General   Wayne [name] 05/11/2018, 4:08 PM  _________________________________________________________________   (provider comments below)

## 2018-05-12 NOTE — Telephone Encounter (Signed)
   Primary Cardiologist: Loralie Champagne, MD  Patent is followed in Advanced HF clinic - per our pre-op protocol, these messages are handled by the CHF team instead of general APP pool. Will forward to Dr. Claris Gladden nurse to review with him when he is in clinic.  Charlie Pitter, PA-C 05/12/2018, 5:14 PM

## 2018-05-17 ENCOUNTER — Other Ambulatory Visit (HOSPITAL_COMMUNITY): Payer: Self-pay | Admitting: Student

## 2018-05-17 NOTE — Telephone Encounter (Signed)
Sending information to Dr. Aundra Dubin

## 2018-05-17 NOTE — Telephone Encounter (Signed)
OK for procedure if no new symptoms.  May hold Xarelto 2 days prior.

## 2018-05-20 ENCOUNTER — Other Ambulatory Visit: Payer: Self-pay | Admitting: Urology

## 2018-05-21 ENCOUNTER — Encounter (HOSPITAL_COMMUNITY): Payer: Self-pay

## 2018-05-21 NOTE — Patient Instructions (Addendum)
TAHJ NJOKU  05/21/2018   Your procedure is scheduled on:  05-25-2018  Report to [facility] Main  Entrance  Report to admitting at 5:30 AM    Call this number if you have problems the morning of surgery [phone]   Remember: Do not eat food or drink liquids :After Midnight.     Take these medicines the morning of surgery with A SIP OF WATER:  AMIODARONE, CRESTOR, CARVEDILOL, TYLENOL IF NEEDED DO NOT TAKE ANY DIABETIC MEDICATIONS DAY OF YOUR SURGERY                               You may not have any metal on your body including hair pins and              piercings  Do not wear jewelry, , lotions, powders or perfumes, deodorant                          Men may shave face and neck.   Do not bring valuables to the hospital. [place].  Contacts, dentures or bridgework may not be worn into surgery.  Leave suitcase in the car. After surgery it may be brought to your room.     Patients discharged the day of surgery will not be allowed to drive home.  Name and phone number of your driver:  Special Instructions: N/A              Please read over the following fact sheets you were given: _____________________________________________________________________             [facility] - Preparing for Surgery Before surgery, you can play an important role.  Because skin is not sterile, your skin needs to be as free of germs as possible.  You can reduce the number of germs on your skin by washing with CHG (chlorahexidine gluconate) soap before surgery.  CHG is an antiseptic cleaner which kills germs and bonds with the skin to continue killing germs even after washing. Please DO NOT use if you have an allergy to CHG or antibacterial soaps.  If your skin becomes reddened/irritated stop using the CHG and inform your nurse when you arrive at Short Stay. Do not shave (including legs and underarms) for at least 48 hours  prior to the first CHG shower.  You may shave your face/neck. Please follow these instructions carefully:  1.  Shower with CHG Soap the night before surgery and the  morning of Surgery.  2.  If you choose to wash your hair, wash your hair first as usual with your  normal  shampoo.  3.  After you shampoo, rinse your hair and body thoroughly to remove the  shampoo.                           4.  Use CHG as you would any other liquid soap.  You can apply chg directly  to the skin and wash                       Gently with a scrungie or clean washcloth.  5.  Apply the CHG  Soap to your body ONLY FROM THE NECK DOWN.   Do not use on face/ open                           Wound or open sores. Avoid contact with eyes, ears mouth and genitals (private parts).                       Wash face,  Genitals (private parts) with your normal soap.             6.  Wash thoroughly, paying special attention to the area where your surgery  will be performed.  7.  Thoroughly rinse your body with warm water from the neck down.  8.  DO NOT shower/wash with your normal soap after using and rinsing off  the CHG Soap.                9.  Pat yourself dry with a clean towel.            10.  Wear clean pajamas.            11.  Place clean sheets on your bed the night of your first shower and do not  sleep with pets. Day of Surgery : Do not apply any lotions/deodorants the morning of surgery.  Please wear clean clothes to the hospital/surgery center.  FAILURE TO FOLLOW THESE INSTRUCTIONS MAY RESULT IN THE CANCELLATION OF YOUR SURGERY PATIENT SIGNATURE_________________________________  NURSE SIGNATURE__________________________________  ________________________________________________________________________

## 2018-05-24 ENCOUNTER — Encounter (HOSPITAL_COMMUNITY)
Admission: RE | Admit: 2018-05-24 | Discharge: 2018-05-24 | Disposition: A | Discharge status: Home / Self Care | Payer: Medicare Other | Source: Ambulatory Visit | Attending: Urology | Admitting: Urology

## 2018-05-24 ENCOUNTER — Encounter (HOSPITAL_COMMUNITY): Payer: Self-pay

## 2018-05-24 ENCOUNTER — Other Ambulatory Visit: Payer: Self-pay

## 2018-05-24 DIAGNOSIS — Z7901 Long term (current) use of anticoagulants: Secondary | ICD-10-CM | POA: Diagnosis not present

## 2018-05-24 DIAGNOSIS — Z794 Long term (current) use of insulin: Secondary | ICD-10-CM | POA: Diagnosis not present

## 2018-05-24 DIAGNOSIS — D494 Neoplasm of unspecified behavior of bladder: Secondary | ICD-10-CM | POA: Diagnosis present

## 2018-05-24 DIAGNOSIS — N401 Enlarged prostate with lower urinary tract symptoms: Secondary | ICD-10-CM | POA: Diagnosis not present

## 2018-05-24 DIAGNOSIS — Z87891 Personal history of nicotine dependence: Secondary | ICD-10-CM | POA: Diagnosis not present

## 2018-05-24 DIAGNOSIS — Z79899 Other long term (current) drug therapy: Secondary | ICD-10-CM | POA: Diagnosis not present

## 2018-05-24 DIAGNOSIS — I110 Hypertensive heart disease with heart failure: Secondary | ICD-10-CM | POA: Diagnosis not present

## 2018-05-24 DIAGNOSIS — D090 Carcinoma in situ of bladder: Secondary | ICD-10-CM | POA: Diagnosis not present

## 2018-05-24 DIAGNOSIS — E7800 Pure hypercholesterolemia, unspecified: Secondary | ICD-10-CM | POA: Diagnosis not present

## 2018-05-24 DIAGNOSIS — Z888 Allergy status to other drugs, medicaments and biological substances status: Secondary | ICD-10-CM | POA: Diagnosis not present

## 2018-05-24 DIAGNOSIS — I252 Old myocardial infarction: Secondary | ICD-10-CM | POA: Diagnosis not present

## 2018-05-24 DIAGNOSIS — R350 Frequency of micturition: Secondary | ICD-10-CM | POA: Diagnosis not present

## 2018-05-24 DIAGNOSIS — Z8249 Family history of ischemic heart disease and other diseases of the circulatory system: Secondary | ICD-10-CM | POA: Diagnosis not present

## 2018-05-24 DIAGNOSIS — Z6836 Body mass index (BMI) 36.0-36.9, adult: Secondary | ICD-10-CM | POA: Diagnosis not present

## 2018-05-24 DIAGNOSIS — R351 Nocturia: Secondary | ICD-10-CM | POA: Diagnosis not present

## 2018-05-24 DIAGNOSIS — I509 Heart failure, unspecified: Secondary | ICD-10-CM | POA: Diagnosis not present

## 2018-05-24 DIAGNOSIS — I2510 Atherosclerotic heart disease of native coronary artery without angina pectoris: Secondary | ICD-10-CM | POA: Diagnosis not present

## 2018-05-24 DIAGNOSIS — Z8551 Personal history of malignant neoplasm of bladder: Secondary | ICD-10-CM | POA: Diagnosis not present

## 2018-05-24 DIAGNOSIS — E119 Type 2 diabetes mellitus without complications: Secondary | ICD-10-CM | POA: Diagnosis not present

## 2018-05-24 DIAGNOSIS — N3941 Urge incontinence: Secondary | ICD-10-CM | POA: Diagnosis not present

## 2018-05-24 DIAGNOSIS — Z95810 Presence of automatic (implantable) cardiac defibrillator: Secondary | ICD-10-CM | POA: Diagnosis not present

## 2018-05-24 HISTORY — DX: Chronic systolic (congestive) heart failure: I50.22

## 2018-05-24 HISTORY — DX: Chronic kidney disease, stage 3 unspecified: N18.30

## 2018-05-24 HISTORY — DX: Old myocardial infarction: I25.2

## 2018-05-24 HISTORY — DX: Presence of coronary angioplasty implant and graft: Z95.5

## 2018-05-24 HISTORY — DX: Chronic kidney disease, stage 3 (moderate): N18.3

## 2018-05-24 HISTORY — DX: Malignant (primary) neoplasm, unspecified: C80.1

## 2018-05-24 HISTORY — DX: Personal history of malignant neoplasm of prostate: Z85.46

## 2018-05-24 HISTORY — DX: Presence of automatic (implantable) cardiac defibrillator: Z95.810

## 2018-05-24 HISTORY — DX: Paroxysmal atrial fibrillation: I48.0

## 2018-05-24 LAB — COMPREHENSIVE METABOLIC PANEL
ALK PHOS: 77 U/L (ref 38–126)
ALT: 22 U/L (ref 17–63)
AST: 28 U/L (ref 15–41)
Albumin: 3.8 g/dL (ref 3.5–5.0)
Anion gap: 9 (ref 5–15)
BILIRUBIN TOTAL: 0.9 mg/dL (ref 0.3–1.2)
BUN: 23 mg/dL — AB (ref 6–20)
CHLORIDE: 107 mmol/L (ref 101–111)
CO2: 28 mmol/L (ref 22–32)
Calcium: 9.7 mg/dL (ref 8.9–10.3)
Creatinine, Ser: 1.49 mg/dL — ABNORMAL HIGH (ref 0.61–1.24)
GFR calc Af Amer: 52 mL/min — ABNORMAL LOW (ref >60)
GFR, EST NON AFRICAN AMERICAN: 44 mL/min — AB (ref >60)
Glucose, Bld: 232 mg/dL — ABNORMAL HIGH (ref 65–99)
Potassium: 5.7 mmol/L — ABNORMAL HIGH (ref 3.5–5.1)
Sodium: 144 mmol/L (ref 135–145)
TOTAL PROTEIN: 7 g/dL (ref 6.5–8.1)

## 2018-05-24 LAB — CBC
HCT: 46.5 % (ref 39.0–52.0)
Hemoglobin: 14.7 g/dL (ref 13.0–17.0)
MCH: 28.8 pg (ref 26.0–34.0)
MCHC: 31.6 g/dL (ref 30.0–36.0)
MCV: 91 fL (ref 78.0–100.0)
PLATELETS: 156 10*3/uL (ref 150–400)
RBC: 5.11 MIL/uL (ref 4.22–5.81)
RDW: 13.5 % (ref 11.5–15.5)
WBC: 6.4 10*3/uL (ref 4.0–10.5)

## 2018-05-24 LAB — HEMOGLOBIN A1C
Hgb A1c MFr Bld: 10.4 % — ABNORMAL HIGH (ref 4.8–5.6)
Mean Plasma Glucose: 251.78 mg/dL

## 2018-05-24 LAB — GLUCOSE, CAPILLARY: GLUCOSE-CAPILLARY: 238 mg/dL — AB (ref 65–99)

## 2018-05-24 NOTE — Progress Notes (Signed)
ICD orders on chart

## 2018-05-24 NOTE — Progress Notes (Signed)
Clearance Dr. Aundra Dubin 05-17-18 epic lov cards 03-10-18 epic  Last device check 03-19-18 epic ekg 03-10-18 epic cxr 06-18-17 epic  Echo 05-23-17 epic

## 2018-05-24 NOTE — H&P (Signed)
Office Visit Report     04/26/2018    Wayne [name]. Wayne [name]         MRN: [id number]  PRIMARY CARE:  Carolann Littler, MD  DOB: Sep 17, 1941, 77 year old Male  REFERRING:  Odis Luster, MD  SSN: -[id number]  PROVIDER:  Festus Aloe, M.D.    LOCATION:  [facility] [phone]    CC: I have bladder cancer.  HPI: Wayne [name] is a 77 year-old male established patient who is here for bladder cancer.  His problem was diagnosed 10/25/2017. The bladder cancer was found because of blood in his urine.   His bladder cancer was treated by removal with scope. Patient denies removal of the entire bladder, radiation, and chemotherapy.   His last cysto was 01/25/2018.   His last U/S or CT Scan was 08/24/2017.   November 2018 TURBT-left HG Ta + epirubicin.   Patient returns and is doing well. He has had no gross hematuria.     CC: Frequency, Nocturia and Urgency  HPI: He generally urinates every hour in the daytime. He sometimes can sit through a two-hour movie without urinating. The patients frequency is worse in the morning.   He has nocturia 1-2 times per night.   Seen in 2013 for increased frequency q 66min, urgency and UUI. He was given trial of Toviaz, but did not take it. PVR 229, urgency, UUI in 2015. Tried Vesicare which helped. F/u pvr 000 ml.   He continues to have urgency and urge incontinence. He tried trospium.     ALLERGIES: Lipitor TABS   MEDICATIONS: Crestor 10 mg tablet 1 tablet PO Daily  Amiodarone Hcl  Carvedilol 12.5 mg tablet  Centrum Silver 0.4 mg-300 mcg-250 mcg tablet Oral  Fish Oil  Furosemide 20 mg tablet  Losartan Potassium 25 mg tablet  Spironolactone  Tresiba Flextouch U-100 100 unit/ml (3 ml) insulin pen  Tylenol Pm Extra Strength 500 mg-25 mg tablet  Veltassa 16.8 gram powder in packet  Xarelto 20 mg tablet    GU PSH: Bladder Instill AntiCA Agent - 10/30/2017 Cystoscopy - 01/25/2018, 09/21/2017 Cystoscopy TURBT 2-5 cm - 10/30/2017 Locm  300-399Mg /Ml Iodine,1Ml - 08/24/2017 TRANSPERI NEEDLE PLACE, PROS - 2009     PSH Notes: Leg Repair, Surgery Prostate Transperineal Placement Of Needles, Hand Surgery   NON-GU PSH: None   GU PMH: History of bladder cancer - 01/25/2018 Gross hematuria - 11/16/2017, - 09/21/2017, - 08/12/2017 Urinary Urgency - 11/16/2017 Bladder tumor/neoplasm - 09/21/2017 ED due to arterial insufficiency, Erectile dysfunction due to arterial insufficiency - 2017 Prostate Cancer, Prostate cancer - 2017 Urinary Frequency, Increased urinary frequency - 2017 Urge incontinence, Urge incontinence of urine - 2015 Elevated PSA, Elevated prostate specific antigen (PSA) - 2015 BPH w/o LUTS, Benign prostatic hypertrophy without lower urinary tract symptoms - 2014 Nocturia, Nocturia - 2014 Unil Inguinal Hernia W/O obst or gang,non-recurrent, Inguinal hernia, unilateral - 2014   NON-GU PMH: Encounter for general adult medical examination without abnormal findings, Encounter for preventive health examination - 2015 Personal history of other diseases of the circulatory system, History of hypertension - 2014 Personal history of other endocrine, nutritional and metabolic disease, History of hypercholesterolemia - 2014   FAMILY HISTORY: Acute Myocardial Infarction - Father Cancer - Mother Death In The Family Father - Father Death In The Family Mother - Mother Family Health Status Number - Runs In Family   SOCIAL HISTORY: Marital Status: Married Preferred Language: English; Ethnicity: Not Hispanic Or Latino; Race: White  REVIEW OF SYSTEMS:    GU Review Male:   Patient denies frequent urination, hard to postpone urination, burning/ pain with urination, get up at night to urinate, leakage of urine, stream starts and stops, trouble starting your stream, have to strain to urinate , erection problems, and penile pain.  Gastrointestinal (Upper):   Patient denies nausea, vomiting, and indigestion/ heartburn.  Gastrointestinal  (Lower):   Patient denies diarrhea and constipation.  Constitutional:   Patient reports fatigue. Patient denies fever, night sweats, and weight loss.  Skin:   Patient denies skin rash/ lesion and itching.  Eyes:   Patient denies blurred vision and double vision.  Ears/ Nose/ Throat:   Patient reports sinus problems. Patient denies sore throat.  Hematologic/Lymphatic:   Patient denies swollen glands and easy bruising.  Cardiovascular:   Patient reports leg swelling. Patient denies chest pains.  Respiratory:   Patient denies cough and shortness of breath.  Endocrine:   Patient denies excessive thirst.  Musculoskeletal:   Patient reports joint pain. Patient denies back pain.  Neurological:   Patient denies headaches and dizziness.  Psychologic:   Patient denies depression and anxiety.   VITAL SIGNS:      04/26/2018 09:58 AM  BP 131/77 mmHg  Pulse 71 /min  Temperature 98.0 F / 36.6 C   GU PHYSICAL EXAMINATION:    Scrotum: No lesions. No edema. No cysts. No warts.   Urethral Meatus: Normal size. No lesion, no wart, no discharge, no polyp. Normal location.  Penis: Circumcised, no warts, no cracks. No dorsal Peyronie's plaques, no left corporal Peyronie's plaques, no right corporal Peyronie's plaques, no scarring, no warts. No balanitis, no meatal stenosis.   MULTI-SYSTEM PHYSICAL EXAMINATION:    Constitutional: Well-nourished. No physical deformities. Normally developed. Good grooming.  Neck: Neck symmetrical, not swollen. Normal tracheal position.  Respiratory: No labored breathing, no use of accessory muscles. Normal breath sounds.  Cardiovascular: Regular rate and rhythm. Normal temperature, normal extremity pulses, no swelling, no varicosities.   Skin: No paleness, no jaundice, no cyanosis. No lesion, no ulcer, no rash.  Neurologic / Psychiatric: Oriented to time, oriented to place, oriented to person. No depression, no anxiety, no agitation.  Gastrointestinal: No mass, no tenderness, no  rigidity, non obese abdomen.    PAST DATA REVIEWED:  Source Of History:  Patient   12/27/14 03/13/14 05/06/10 10/19/09 04/20/09 01/19/09 08/07/08 07/24/08  PSA  Total PSA 0.09  0.11  1.55  1.11  2.73  4.22  4.39  4.36    PROCEDURES:         Flexible Cystoscopy - 52000  Risks, benefits, and some of the potential complications of the procedure were discussed with the patient. All questions were answered. Informed consent was obtained. Antibiotic prophylaxis was given -- Cephalexin. Sterile technique and intraurethral analgesia were used.  Meatus:  Normal size. Normal location. Normal condition.  Urethra:  No strictures.  External Sphincter:  Normal.  Verumontanum:  Normal.  Prostate:  Non-obstructing. No hyperplasia.  Bladder Neck:  Non-obstructing.  Ureteral Orifices:  Normal location. Normal size. Normal shape. Effluxed clear urine.  Bladder:  Erythematous mucosa - posterior adjacent to prior biopsy/resection site. More conspicuous today and early papillary appearance. No trabeculation. No tumors. No stones.      The lower urinary tract was carefully examined. The procedure was well-tolerated and without complications. Antibiotic instructions were given. Instructions were given to call the office immediately for bloody urine, difficulty urinating, painful urination, fever, chills, nausea, vomiting or other illness.  The patient stated that he understood these instructions and would comply with them.        Urinalysis Dipstick Dipstick Cont'd Micro  Color: Yellow Bilirubin: Neg WBC/hpf: NS (Not Seen)  Appearance: Clear Ketones: Neg RBC/hpf: 0 - 2/hpf  Specific Gravity: 1.025 Blood: Neg Bacteria: NS (Not Seen)  pH: 6.0 Protein: 1+ Cystals: NS (Not Seen)  Glucose: 1+ Urobilinogen: 0.2 Casts: NS (Not Seen)    Nitrites: Neg Trichomonas: Not Present    Leukocyte Esterase: Neg Mucous: Present      Epithelial Cells: 0 - 5/hpf      Yeast: NS (Not Seen)      Sperm: Not Present   ASSESSMENT:        ICD-10 Details  1 GU:   Bladder tumor/neoplasm - D41.4    PLAN:           Medications   Stop Meds: Trospium Chloride 20 mg tablet 1 tablet PO Daily  Start: 01/25/2018  Stop: 01/20/2019   Discontinue: 04/26/2018  - Reason: unknown         Schedule Return Visit/Planned Activity: Next Available Appointment - Schedule Surgery         Document Letter(s):  Created for Patient: Clinical Summary        Notes:   Bladder neoplasm/erythema-I recommended a biopsy and fulguration. Discussed nature risks benefits and alternatives. All questions answered. We'll proceed.   * Signed by Festus Aloe, M.D. on 04/26/18 at 5:07 PM (EDT)*     The information contained in this medical record document is considered private and confidential patient information. This information can only be used for the medical diagnosis and/or medical services that are being provided by the patient's selected caregivers. This information can only be distributed outside of the patient's care if the patient agrees and signs waivers of authorization for this information to be sent to an outside source or route.

## 2018-05-24 NOTE — Anesthesia Preprocedure Evaluation (Addendum)
Anesthesia Evaluation  Patient identified by MRN, date of birth, ID band Patient awake    Reviewed: Allergy & Precautions, NPO status , Patient's Chart, lab work & pertinent test results  Airway Mallampati: II  TM Distance: >3 FB Neck ROM: Full    Dental no notable dental hx. (+) Upper Dentures, Lower Dentures   Pulmonary shortness of breath, sleep apnea , former smoker,    Pulmonary exam normal breath sounds clear to auscultation       Cardiovascular hypertension, Pt. on medications and Pt. on home beta blockers + CAD, + Past MI, + Peripheral Vascular Disease and +CHF  Normal cardiovascular exam+ Cardiac Defibrillator  Rhythm:Regular Rate:Normal     Neuro/Psych negative neurological ROS     GI/Hepatic negative GI ROS, Neg liver ROS,   Endo/Other  diabetesMorbid obesity  Renal/GU Renal disease     Musculoskeletal   Abdominal (+) + obese,   Peds  Hematology negative hematology ROS (+)   Anesthesia Other Findings All: Plavix, entresto, ancef  Reproductive/Obstetrics                            Anesthesia Physical Anesthesia Plan  ASA: IV  Anesthesia Plan: General   Post-op Pain Management:    Induction: Intravenous  PONV Risk Score and Plan:   Airway Management Planned: LMA  Additional Equipment:   Intra-op Plan:   Post-operative Plan:   Informed Consent: I have reviewed the patients History and Physical, chart, labs and discussed the procedure including the risks, benefits and alternatives for the proposed anesthesia with the patient or authorized representative who has indicated his/her understanding and acceptance.     Plan Discussed with: CRNA  Anesthesia Plan Comments:        Anesthesia Quick Evaluation

## 2018-05-24 NOTE — Progress Notes (Signed)
hgba1c and cmp routed to Dr. Junious Silk via epic.

## 2018-05-25 ENCOUNTER — Encounter (HOSPITAL_COMMUNITY): Payer: Self-pay | Admitting: *Deleted

## 2018-05-25 ENCOUNTER — Ambulatory Visit (HOSPITAL_COMMUNITY): Payer: Medicare Other | Admitting: Anesthesiology

## 2018-05-25 ENCOUNTER — Other Ambulatory Visit: Payer: Self-pay | Admitting: Student

## 2018-05-25 ENCOUNTER — Ambulatory Visit (HOSPITAL_COMMUNITY)
Admission: RE | Admit: 2018-05-25 | Discharge: 2018-05-25 | Disposition: A | Discharge status: Home / Self Care | Payer: Medicare Other | Source: Ambulatory Visit | Attending: Urology | Admitting: Urology

## 2018-05-25 ENCOUNTER — Encounter (HOSPITAL_COMMUNITY)
Admission: RE | Disposition: A | Discharge status: Home / Self Care | Payer: Self-pay | Source: Ambulatory Visit | Attending: Urology

## 2018-05-25 DIAGNOSIS — N401 Enlarged prostate with lower urinary tract symptoms: Secondary | ICD-10-CM | POA: Diagnosis not present

## 2018-05-25 DIAGNOSIS — R350 Frequency of micturition: Secondary | ICD-10-CM | POA: Diagnosis not present

## 2018-05-25 DIAGNOSIS — I2510 Atherosclerotic heart disease of native coronary artery without angina pectoris: Secondary | ICD-10-CM | POA: Insufficient documentation

## 2018-05-25 DIAGNOSIS — Z8249 Family history of ischemic heart disease and other diseases of the circulatory system: Secondary | ICD-10-CM | POA: Insufficient documentation

## 2018-05-25 DIAGNOSIS — Z87891 Personal history of nicotine dependence: Secondary | ICD-10-CM | POA: Insufficient documentation

## 2018-05-25 DIAGNOSIS — I130 Hypertensive heart and chronic kidney disease with heart failure and stage 1 through stage 4 chronic kidney disease, or unspecified chronic kidney disease: Secondary | ICD-10-CM | POA: Diagnosis not present

## 2018-05-25 DIAGNOSIS — E7800 Pure hypercholesterolemia, unspecified: Secondary | ICD-10-CM | POA: Insufficient documentation

## 2018-05-25 DIAGNOSIS — Z794 Long term (current) use of insulin: Secondary | ICD-10-CM | POA: Insufficient documentation

## 2018-05-25 DIAGNOSIS — N3941 Urge incontinence: Secondary | ICD-10-CM | POA: Diagnosis not present

## 2018-05-25 DIAGNOSIS — Z8551 Personal history of malignant neoplasm of bladder: Secondary | ICD-10-CM | POA: Insufficient documentation

## 2018-05-25 DIAGNOSIS — Z888 Allergy status to other drugs, medicaments and biological substances status: Secondary | ICD-10-CM | POA: Insufficient documentation

## 2018-05-25 DIAGNOSIS — C679 Malignant neoplasm of bladder, unspecified: Secondary | ICD-10-CM | POA: Diagnosis not present

## 2018-05-25 DIAGNOSIS — D414 Neoplasm of uncertain behavior of bladder: Secondary | ICD-10-CM | POA: Diagnosis not present

## 2018-05-25 DIAGNOSIS — R351 Nocturia: Secondary | ICD-10-CM | POA: Insufficient documentation

## 2018-05-25 DIAGNOSIS — I110 Hypertensive heart disease with heart failure: Secondary | ICD-10-CM | POA: Diagnosis not present

## 2018-05-25 DIAGNOSIS — Z7901 Long term (current) use of anticoagulants: Secondary | ICD-10-CM | POA: Insufficient documentation

## 2018-05-25 DIAGNOSIS — E119 Type 2 diabetes mellitus without complications: Secondary | ICD-10-CM | POA: Insufficient documentation

## 2018-05-25 DIAGNOSIS — I509 Heart failure, unspecified: Secondary | ICD-10-CM | POA: Diagnosis not present

## 2018-05-25 DIAGNOSIS — I252 Old myocardial infarction: Secondary | ICD-10-CM | POA: Diagnosis not present

## 2018-05-25 DIAGNOSIS — D090 Carcinoma in situ of bladder: Secondary | ICD-10-CM | POA: Diagnosis not present

## 2018-05-25 DIAGNOSIS — C672 Malignant neoplasm of lateral wall of bladder: Secondary | ICD-10-CM

## 2018-05-25 DIAGNOSIS — Z6836 Body mass index (BMI) 36.0-36.9, adult: Secondary | ICD-10-CM | POA: Insufficient documentation

## 2018-05-25 DIAGNOSIS — I5022 Chronic systolic (congestive) heart failure: Secondary | ICD-10-CM | POA: Diagnosis not present

## 2018-05-25 DIAGNOSIS — Z79899 Other long term (current) drug therapy: Secondary | ICD-10-CM | POA: Insufficient documentation

## 2018-05-25 DIAGNOSIS — N183 Chronic kidney disease, stage 3 (moderate): Secondary | ICD-10-CM | POA: Diagnosis not present

## 2018-05-25 DIAGNOSIS — Z95810 Presence of automatic (implantable) cardiac defibrillator: Secondary | ICD-10-CM | POA: Insufficient documentation

## 2018-05-25 HISTORY — PX: CYSTOSCOPY WITH FULGERATION: SHX6638

## 2018-05-25 LAB — BASIC METABOLIC PANEL
ANION GAP: 9 (ref 5–15)
BUN: 21 mg/dL — ABNORMAL HIGH (ref 6–20)
CHLORIDE: 105 mmol/L (ref 101–111)
CO2: 26 mmol/L (ref 22–32)
Calcium: 9.1 mg/dL (ref 8.9–10.3)
Creatinine, Ser: 1.34 mg/dL — ABNORMAL HIGH (ref 0.61–1.24)
GFR calc Af Amer: 59 mL/min — ABNORMAL LOW (ref >60)
GFR, EST NON AFRICAN AMERICAN: 51 mL/min — AB (ref >60)
GLUCOSE: 222 mg/dL — AB (ref 65–99)
POTASSIUM: 4.1 mmol/L (ref 3.5–5.1)
Sodium: 140 mmol/L (ref 135–145)

## 2018-05-25 LAB — GLUCOSE, CAPILLARY
Glucose-Capillary: 238 mg/dL — ABNORMAL HIGH (ref 65–99)
Glucose-Capillary: 220 mg/dL — ABNORMAL HIGH (ref 65–99)

## 2018-05-25 LAB — HEMOGLOBIN A1C
Hgb A1c MFr Bld: 10.2 % — ABNORMAL HIGH (ref 4.8–5.6)
Mean Plasma Glucose: 246.04 mg/dL

## 2018-05-25 SURGERY — CYSTOSCOPY, WITH BLADDER FULGURATION
Anesthesia: General

## 2018-05-25 MED ORDER — SODIUM CHLORIDE 0.9 % IJ SOLN
50.0000 mg | Freq: Once | INTRAMUSCULAR | Status: AC
  Administered 2018-05-25: 50 mg via INTRAVESICAL
  Filled 2018-05-25: qty 25

## 2018-05-25 MED ORDER — FENTANYL CITRATE (PF) 100 MCG/2ML IJ SOLN
INTRAMUSCULAR | Status: AC
  Filled 2018-05-25: qty 2

## 2018-05-25 MED ORDER — HYDROCODONE-ACETAMINOPHEN 7.5-325 MG PO TABS: 1.0000 | ORAL_TABLET | Freq: Once | ORAL | Status: DC | PRN

## 2018-05-25 MED ORDER — INSULIN ASPART 100 UNIT/ML ~~LOC~~ SOLN
SUBCUTANEOUS | Status: AC
  Administered 2018-05-25: 4 [IU] via SUBCUTANEOUS
  Filled 2018-05-25: qty 1

## 2018-05-25 MED ORDER — ONDANSETRON HCL 4 MG/2ML IJ SOLN
INTRAMUSCULAR | Status: DC | PRN
  Administered 2018-05-25: 4 mg via INTRAVENOUS

## 2018-05-25 MED ORDER — STERILE WATER FOR IRRIGATION IR SOLN
Status: DC | PRN
  Administered 2018-05-25: 3000 mL

## 2018-05-25 MED ORDER — INSULIN ASPART 100 UNIT/ML ~~LOC~~ SOLN
4.0000 [IU] | Freq: Once | SUBCUTANEOUS | Status: AC
  Administered 2018-05-25: 4 [IU] via SUBCUTANEOUS

## 2018-05-25 MED ORDER — INSULIN ASPART 100 UNIT/ML ~~LOC~~ SOLN
5.0000 [IU] | Freq: Once | SUBCUTANEOUS | Status: AC
  Administered 2018-05-25: 5 [IU] via SUBCUTANEOUS

## 2018-05-25 MED ORDER — LIDOCAINE 2% (20 MG/ML) 5 ML SYRINGE
INTRAMUSCULAR | Status: DC | PRN
  Administered 2018-05-25: 100 mg via INTRAVENOUS

## 2018-05-25 MED ORDER — CIPROFLOXACIN IN D5W 400 MG/200ML IV SOLN
400.0000 mg | Freq: Once | INTRAVENOUS | Status: AC
  Administered 2018-05-25: 400 mg via INTRAVENOUS

## 2018-05-25 MED ORDER — MEPERIDINE HCL 50 MG/ML IJ SOLN: 6.2500 mg | INTRAMUSCULAR | Status: DC | PRN

## 2018-05-25 MED ORDER — PROPOFOL 10 MG/ML IV BOLUS
INTRAVENOUS | Status: DC | PRN
  Administered 2018-05-25: 120 mg via INTRAVENOUS

## 2018-05-25 MED ORDER — CIPROFLOXACIN IN D5W 400 MG/200ML IV SOLN
INTRAVENOUS | Status: AC
  Filled 2018-05-25: qty 200

## 2018-05-25 MED ORDER — LACTATED RINGERS IV SOLN
INTRAVENOUS | Status: DC
  Administered 2018-05-25: 07:00:00 via INTRAVENOUS

## 2018-05-25 MED ORDER — BELLADONNA ALKALOIDS-OPIUM 16.2-60 MG RE SUPP
RECTAL | Status: DC | PRN
  Administered 2018-05-25: 1 via RECTAL

## 2018-05-25 MED ORDER — BELLADONNA-OPIUM 16.2-30 MG RE SUPP
RECTAL | Status: AC
  Filled 2018-05-25: qty 1

## 2018-05-25 MED ORDER — PROMETHAZINE HCL 25 MG/ML IJ SOLN: 6.2500 mg | INTRAMUSCULAR | Status: DC | PRN

## 2018-05-25 MED ORDER — ACETAMINOPHEN 500 MG PO TABS
1000.0000 mg | ORAL_TABLET | Freq: Once | ORAL | Status: AC
  Administered 2018-05-25: 1000 mg via ORAL
  Filled 2018-05-25: qty 2

## 2018-05-25 MED ORDER — ONDANSETRON HCL 4 MG/2ML IJ SOLN
INTRAMUSCULAR | Status: AC
  Filled 2018-05-25: qty 2

## 2018-05-25 MED ORDER — PROPOFOL 10 MG/ML IV BOLUS
INTRAVENOUS | Status: AC
  Filled 2018-05-25: qty 20

## 2018-05-25 MED ORDER — EPHEDRINE 5 MG/ML INJ
INTRAVENOUS | Status: AC
  Filled 2018-05-25: qty 10

## 2018-05-25 MED ORDER — DEXAMETHASONE SODIUM PHOSPHATE 10 MG/ML IJ SOLN
INTRAMUSCULAR | Status: DC | PRN
  Administered 2018-05-25: 10 mg via INTRAVENOUS

## 2018-05-25 MED ORDER — ACETAMINOPHEN 10 MG/ML IV SOLN: 1000.0000 mg | Freq: Once | INTRAVENOUS | Status: DC | PRN

## 2018-05-25 MED ORDER — FENTANYL CITRATE (PF) 100 MCG/2ML IJ SOLN
INTRAMUSCULAR | Status: DC | PRN
  Administered 2018-05-25 (×2): 25 ug via INTRAVENOUS
  Administered 2018-05-25: 50 ug via INTRAVENOUS

## 2018-05-25 MED ORDER — EPHEDRINE SULFATE-NACL 50-0.9 MG/10ML-% IV SOSY
PREFILLED_SYRINGE | INTRAVENOUS | Status: DC | PRN
  Administered 2018-05-25 (×2): 5 mg via INTRAVENOUS

## 2018-05-25 MED ORDER — DEXAMETHASONE SODIUM PHOSPHATE 10 MG/ML IJ SOLN
INTRAMUSCULAR | Status: AC
  Filled 2018-05-25: qty 1

## 2018-05-25 MED ORDER — CEFAZOLIN SODIUM-DEXTROSE 2-4 GM/100ML-% IV SOLN: 2.0000 g | Freq: Once | INTRAVENOUS | Status: DC

## 2018-05-25 MED ORDER — HYDROMORPHONE HCL 1 MG/ML IJ SOLN: 0.2500 mg | INTRAMUSCULAR | Status: DC | PRN

## 2018-05-25 MED ORDER — INSULIN ASPART 100 UNIT/ML ~~LOC~~ SOLN
SUBCUTANEOUS | Status: AC
  Filled 2018-05-25: qty 1

## 2018-05-25 MED ORDER — RIVAROXABAN 20 MG PO TABS: 20.0000 mg | ORAL_TABLET | Freq: Every day | ORAL | 3 refills | Status: DC

## 2018-05-25 MED ORDER — CEFAZOLIN SODIUM-DEXTROSE 2-4 GM/100ML-% IV SOLN
INTRAVENOUS | Status: AC
  Filled 2018-05-25: qty 100

## 2018-05-25 SURGICAL SUPPLY — 14 items
BAG URINE DRAINAGE (UROLOGICAL SUPPLIES) IMPLANT
BAG URO CATCHER STRL LF (MISCELLANEOUS) ×2 IMPLANT
CATH FOLEY 2WAY SLVR  5CC 16FR (CATHETERS) ×1
CATH FOLEY 2WAY SLVR 5CC 16FR (CATHETERS) IMPLANT
COVER FOOTSWITCH UNIV (MISCELLANEOUS) ×1 IMPLANT
COVER SURGICAL LIGHT HANDLE (MISCELLANEOUS) ×2 IMPLANT
GLOVE BIOGEL M STRL SZ7.5 (GLOVE) ×2 IMPLANT
GOWN STRL REUS W/TWL XL LVL3 (GOWN DISPOSABLE) ×2 IMPLANT
LOOP CUT BIPOLAR 24F LRG (ELECTROSURGICAL) IMPLANT
MANIFOLD NEPTUNE II (INSTRUMENTS) ×2 IMPLANT
PACK CYSTO (CUSTOM PROCEDURE TRAY) ×2 IMPLANT
PLUG CATH AND CAP STER (CATHETERS) ×1 IMPLANT
TUBING CONNECTING 10 (TUBING) ×2 IMPLANT
TUBING UROLOGY SET (TUBING) ×1 IMPLANT

## 2018-05-25 NOTE — Interval H&P Note (Signed)
History and Physical Interval Note:  05/25/2018 7:27 AM  Wayne [name]  has presented today for surgery, with the diagnosis of BLADDER NEOPLASM  The various methods of treatment have been discussed with the patient and family. After consideration of risks, benefits and other options for treatment, the patient has consented to  Procedure(s): CYSTOSCOPY WITH FULGERATION/BLADDER BIOPSY/ POST OPERATIVE INSTILLATION OF EPIRUBICIN (N/A) as a surgical intervention. The patient's history has been reviewed, patient examined, no change in status, stable for surgery.  I have reviewed the patient's chart and labs. He is well. No dysuria, fever or gross hematuria. Questions were answered to the patient's satisfaction.  Discussed possible foley or stent.    Festus Aloe

## 2018-05-25 NOTE — Progress Notes (Signed)
Per Dr. Valma Cava, will give 4 units subcutaneous IV for high blood sugar and a1c.

## 2018-05-25 NOTE — Anesthesia Procedure Notes (Signed)
Procedure Name: LMA Insertion Date/Time: 05/25/2018 7:41 AM Performed by: Pierra Skora D, CRNA Pre-anesthesia Checklist: Patient identified, Emergency Drugs available, Suction available and Patient being monitored Patient Re-evaluated:Patient Re-evaluated prior to induction Oxygen Delivery Method: Circle system utilized Preoxygenation: Pre-oxygenation with 100% oxygen Induction Type: IV induction Ventilation: Mask ventilation without difficulty LMA: LMA inserted LMA Size: 4.0 Tube type: Oral Number of attempts: 1 Placement Confirmation: positive ETCO2 and breath sounds checked- equal and bilateral Tube secured with: Tape Dental Injury: Teeth and Oropharynx as per pre-operative assessment

## 2018-05-25 NOTE — Transfer of Care (Signed)
Immediate Anesthesia Transfer of Care Note  Patient: Wayne [name]  Procedure(s) Performed: CYSTOSCOPY WITH FULGERATION/BLADDER BIOPSY , 15 mm lesion (N/A )  Patient Location: PACU  Anesthesia Type:General  Level of Consciousness: awake, alert  and oriented  Airway & Oxygen Therapy: Patient Spontanous Breathing and Patient connected to face mask oxygen  Post-op Assessment: Report given to RN and Post -op Vital signs reviewed and stable  Post vital signs: Reviewed and stable  Last Vitals:  Vitals Value Taken Time  BP 140/76 05/25/2018  8:21 AM  Temp    Pulse 59 05/25/2018  8:21 AM  Resp 16 05/25/2018  8:21 AM  SpO2 96 % 05/25/2018  8:21 AM  Vitals shown include unvalidated device data.  Last Pain:  Vitals:   05/25/18 0651  TempSrc:   PainSc: 0-No pain         Complications: No apparent anesthesia complications

## 2018-05-25 NOTE — Progress Notes (Signed)
Per Dr. Junious Silk order, foley catheter drained ( epirubicin was instilled at 830), foley catheter bag changed, drained, urine is light pink; foley catheter removed

## 2018-05-25 NOTE — Discharge Instructions (Addendum)
Cystoscopy, Care After  Refer to this sheet in the next few weeks. These instructions provide you with information about caring for yourself after your procedure. Your health care provider may also give you more specific instructions. Your treatment has been planned according to current medical practices, but problems sometimes occur. Call your health care provider if you have any problems or questions after your procedure.  What can I expect after the procedure?  After the procedure, it is common to have:   Mild pain when you urinate. Pain should stop within a few minutes after you urinate. This may last for up to 1 week.   A small amount of blood in your urine for several days.   Feeling like you need to urinate but producing only a small amount of urine.    Follow these instructions at home:    Medicines   Take over-the-counter and prescription medicines only as told by your health care provider.   If you were prescribed an antibiotic medicine, take it as told by your health care provider. Do not stop taking the antibiotic even if you start to feel better.  General instructions     Return to your normal activities as told by your health care provider. Ask your health care provider what activities are safe for you.   Do not drive for 24 hours if you received a sedative.   Watch for any blood in your urine. If the amount of blood in your urine increases, call your health care provider.   Follow instructions from your health care provider about eating or drinking restrictions.   If a tissue sample was removed for testing (biopsy) during your procedure, it is your responsibility to get your test results. Ask your health care provider or the department performing the test when your results will be ready.   Drink enough fluid to keep your urine clear or pale yellow.   Keep all follow-up visits as told by your health care provider. This is important.  Contact a health care provider if:   You have pain that  gets worse or does not get better with medicine, especially pain when you urinate.   You have difficulty urinating.  Get help right away if:   You have more blood in your urine.   You have blood clots in your urine.   You have abdominal pain.   You have a fever or chills.   You are unable to urinate.  This information is not intended to replace advice given to you by your health care provider. Make sure you discuss any questions you have with your health care provider.  Document Released: 06/20/2005 Document Revised: 05/08/2016 Document Reviewed: 10/18/2015  [employer]vier Interactive Patient Education  2018 [employer]

## 2018-05-25 NOTE — Op Note (Signed)
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  He has had more erythema coalescing on the left bladder wall and was brought today for biopsy.  Findings: On exam under anesthesia the penis was circumcised and without mass or lesion.  The testicles were descended bilaterally and palpably normal.  On digital rectal exam the prostate is small and flat.  No hard area or nodule.  On cystoscopy the urethra was normal apart from a mild stricture in the proximal bulbar urethra.  Prostate mildly obstructive.  Trigone and ureteral orifices were normal with clear eflux.  No stone or foreign body in the bladder.  Coalescing area of erythema at the 5 o'clock position on the left bladder wall at the 5 o'clock position of the prior resection site.  This was friable and early papillary appearance.  No other abnormalities noted.  Description of procedure: After consent was obtained the patient brought to the operating room.  After adequate anesthesia was placed in lithotomy position and prepped and draped in the usual sterile fashion.  A timeout was performed to confirm the patient and procedure.  The cystoscope was passed per urethra and the scope was used to dilate a mild bulb stricture.  Bladder inspected with a 30 and 70 degree lens.  I then took the 7 [place] flexible biopsy forceps and biopsied the lesion x2.  I then took the rigid biopsy forceps and got a slightly deeper bite.  There was no concern for perforation.  This lesion appeared superficial.  Fulguration of the biopsy sites and the entire lesion was obtained and it was about 15 mm in size.  Hemostasis was excellent at low pressure.  No  other abnormalities noted.  The scope was removed and a 16 [place] Foley catheter placed.  We then performed exam under anesthesia and placed a B&O suppository.  He was awakened and taken to the recovery room in stable condition.  Complications: None  Blood loss: Minimal  Specimens to pathology: Left posterior bladder biopsy x3-sent together  Drains: 32 French Foley which will be removed in the PACU  Disposition: Patient stable to PACU  Instillation of epirubicin in PACU: Epirubicin 50 mg and 45 mL was instilled per urethra and left indwelling in the bladder for 50 minutes.

## 2018-05-25 NOTE — Anesthesia Postprocedure Evaluation (Signed)
Anesthesia Post Note  Patient: Wayne [name]  Procedure(s) Performed: CYSTOSCOPY WITH FULGERATION/BLADDER BIOPSY , 15 mm lesion (N/A )     Patient location during evaluation: PACU Anesthesia Type: General Level of consciousness: awake and alert Pain management: pain level controlled Vital Signs Assessment: post-procedure vital signs reviewed and stable Respiratory status: spontaneous breathing, nonlabored ventilation, respiratory function stable and patient connected to nasal cannula oxygen Cardiovascular status: blood pressure returned to baseline and stable Postop Assessment: no apparent nausea or vomiting Anesthetic complications: no    Last Vitals:  Vitals:   05/25/18 0600 05/25/18 0821  BP: 139/75 140/76  Pulse: (!) 58 (!) 59  Resp: 16 16  Temp: 36.5 C 36.6 C  SpO2: 98% 96%    Last Pain:  Vitals:   05/25/18 0845  TempSrc:   PainSc: 0-No pain                 Barnet Glasgow

## 2018-05-26 ENCOUNTER — Encounter (HOSPITAL_COMMUNITY): Payer: Self-pay | Admitting: Urology

## 2018-05-28 ENCOUNTER — Telehealth: Payer: Self-pay | Admitting: Family Medicine

## 2018-05-28 NOTE — Telephone Encounter (Signed)
Copied from [place] [id number]. Topic: Quick Communication - See Telephone Encounter >> May 28, 2018  8:17 AM Mylinda Latina, NT wrote: Patient called and states he would like to pick up insulin samples from the office.  Patient is not sure the name of the insulin , but states he had it before. Patient is requesting a cb  CB [phone]

## 2018-05-28 NOTE — Telephone Encounter (Signed)
Samples of this drug were given to the patient, quantity 2 pen Tesiba 100, Lot Number [id number], exp 10/20.  FYI

## 2018-05-28 NOTE — Telephone Encounter (Signed)
Pt  Is calling back and the name of insulin tresiba flex touch. Pt also would like md to know his bs was down to 135

## 2018-06-09 ENCOUNTER — Ambulatory Visit (INDEPENDENT_AMBULATORY_CARE_PROVIDER_SITE_OTHER): Payer: Medicare Other | Admitting: *Deleted

## 2018-06-09 DIAGNOSIS — I472 Ventricular tachycardia, unspecified: Secondary | ICD-10-CM

## 2018-06-09 NOTE — Progress Notes (Signed)
Remote ICD transmission.   

## 2018-06-10 ENCOUNTER — Encounter: Payer: Self-pay | Admitting: Cardiology

## 2018-06-10 LAB — CUP PACEART REMOTE DEVICE CHECK
Battery Remaining Longevity: 112 mo
Battery Voltage: 3.01 V
Date Time Interrogation Session: 20190626041705
HIGH POWER IMPEDANCE MEASURED VALUE: 76 Ohm
Implantable Lead Implant Date: 20180612
Implantable Lead Model: 6935
Lead Channel Impedance Value: 399 Ohm
Lead Channel Pacing Threshold Amplitude: 1 V
Lead Channel Pacing Threshold Pulse Width: 0.4 ms
Lead Channel Sensing Intrinsic Amplitude: 13.375 mV
Lead Channel Sensing Intrinsic Amplitude: 13.375 mV
Lead Channel Setting Pacing Amplitude: 2.5 V
MDC IDC LEAD LOCATION: 753860
MDC IDC MSMT LEADCHNL RV IMPEDANCE VALUE: 494 Ohm
MDC IDC PG IMPLANT DT: 20180103
MDC IDC SET LEADCHNL RV PACING PULSEWIDTH: 0.4 ms
MDC IDC SET LEADCHNL RV SENSING SENSITIVITY: 0.3 mV
MDC IDC STAT BRADY RV PERCENT PACED: 0.01 %

## 2018-06-22 ENCOUNTER — Ambulatory Visit (HOSPITAL_BASED_OUTPATIENT_CLINIC_OR_DEPARTMENT_OTHER)
Admission: RE | Admit: 2018-06-22 | Discharge: 2018-06-22 | Disposition: A | Discharge status: Home / Self Care | Payer: Medicare Other | Source: Ambulatory Visit | Attending: Cardiology | Admitting: Cardiology

## 2018-06-22 ENCOUNTER — Ambulatory Visit (HOSPITAL_COMMUNITY)
Admission: RE | Admit: 2018-06-22 | Discharge: 2018-06-22 | Disposition: A | Discharge status: Home / Self Care | Payer: Medicare Other | Source: Ambulatory Visit | Attending: Cardiology | Admitting: Cardiology

## 2018-06-22 ENCOUNTER — Other Ambulatory Visit (HOSPITAL_COMMUNITY): Payer: Self-pay

## 2018-06-22 VITALS — BP 130/72 | HR 63 | Wt 243.0 lb

## 2018-06-22 DIAGNOSIS — I472 Ventricular tachycardia, unspecified: Secondary | ICD-10-CM

## 2018-06-22 DIAGNOSIS — I2510 Atherosclerotic heart disease of native coronary artery without angina pectoris: Secondary | ICD-10-CM | POA: Insufficient documentation

## 2018-06-22 DIAGNOSIS — E1122 Type 2 diabetes mellitus with diabetic chronic kidney disease: Secondary | ICD-10-CM | POA: Insufficient documentation

## 2018-06-22 DIAGNOSIS — I5022 Chronic systolic (congestive) heart failure: Secondary | ICD-10-CM | POA: Diagnosis not present

## 2018-06-22 DIAGNOSIS — Z87891 Personal history of nicotine dependence: Secondary | ICD-10-CM | POA: Diagnosis not present

## 2018-06-22 DIAGNOSIS — I350 Nonrheumatic aortic (valve) stenosis: Secondary | ICD-10-CM | POA: Insufficient documentation

## 2018-06-22 DIAGNOSIS — N183 Chronic kidney disease, stage 3 (moderate): Secondary | ICD-10-CM | POA: Insufficient documentation

## 2018-06-22 DIAGNOSIS — I130 Hypertensive heart and chronic kidney disease with heart failure and stage 1 through stage 4 chronic kidney disease, or unspecified chronic kidney disease: Secondary | ICD-10-CM | POA: Diagnosis not present

## 2018-06-22 MED ORDER — LOSARTAN POTASSIUM 25 MG PO TABS: 25.0000 mg | ORAL_TABLET | Freq: Two times a day (BID) | ORAL | 3 refills | Status: DC

## 2018-06-22 MED ORDER — SPIRONOLACTONE 25 MG PO TABS: 25.0000 mg | ORAL_TABLET | Freq: Every evening | ORAL | 6 refills | Status: DC

## 2018-06-22 NOTE — Patient Instructions (Signed)
Increase Spironolactone 25 mg (1 tab) daily  Your physician recommends that you return for lab work in: 10 days   Your physician recommends that you schedule a follow-up appointment in: 3 months with APP Please Call an Schedule Appointment (call in August)

## 2018-06-22 NOTE — Progress Notes (Signed)
Patient ID: Wayne [name], male   DOB: [birth year], 77 y.o.   MRN: [id number]   Advanced Heart Failure Clinic Note   Patient ID: Wayne [name], male   DOB: [birth year], 77 y.o.   MRN: [id number] PCP: Dr. Elease Hashimoto Cardiology: Dr. Earline Mayotte Wayne [name] is a 77 y.o. male  with history of DM, HTN, CAD, and ischemic cardiomyopathy who presents for followup of CHF and CAD. Patient had a CHF exacerbation in 2/12 and was found to have LV systolic dysfunction with EF around 20%. LHC showed RCA, ramus, and CFX disease. RCA was subtotally occluded. Cardiac MRI showed that all walls, including the inferior wall, should be viable. Patient therefore underwent opening of his chronic totally occluded RCA as well as PCI to the ramus in 2/12. He received drug eluting stents and was on Effient.  He had an echo in 2/13 that showed EF improved to 45% with moderate LV dilation and mild LV hypertrophy.   In 5/13, he developed a large left lower leg hematoma.  He was still on Effient at that time.  ASA and Effient were stopped.  The hematoma did not resolve.  In 7/13, he was re-admitted with septic shock from MSSA from an abscess in his left gastrocnemius.  He also grew Pseudomonas from the left gastrocnemius as well.  He had a prolonged course in the hospital and later in a rehab facility.  Ultimately, he got back home again.  I had him get an echo in 1/14, and this showed EF 15% with diffuse hypokinesis and inferoposterior akinesis.  He had been off of most of his prior cardiac medications.  Took him back for [facility] in 1/14.  This showed subtotal occlusion of a small AV LCx and 95% ostial in-stent restenosis in the RCA.  He was treated in 2/14 with a Xience DES to the ostial RCA and begun on Plavix.  Unfortunately, he developed diffuse hives after starting Plavix and had to be switched to ticlopidine.  He has tolerated ticlopidine.   Echo done in 12/14 showed some improvement in LV function but EF was still low at 30-35%.  He did not want  ICD.    Presented to ED 11/14/15 with worsening SOB after a few steps and CXR with CHF and bilateral effusions in the setting of marked medical non-compliance. States he stopped taking his medicines in 01/2015 (except for ASA 81 and a multivitamin).  He was feeling good and decided that he did not need them anymore.  Also c/o URI symptoms. Diuresed over 2 L with IV diuretics in the ER and started on lasix 20 mg daily.   Echo (12/16) showed EF 20-25% with diffuse hypokinesis (down from 35% in 12/15).  Echo 10/17 showed EF 15% with mildly decreased RV systolic function.    He had left and right heart catheterization in 12/17. This showed elevated right and left heart filling pressures and low cardiac output. The RCA was totally occluded with collaterals, there was 95% ostial to mid LAD stenosis.  Patient had PCI with DES to proximal-mid LAD.    He was noted to be in atrial fibrillation with RVR in 2/18 at cardiac rehab. He felt fatigued.  He was admitted and started on amiodarone for rate control. ASA was stopped, ticagrelor was decreased to 60 mg bid, and Xarelto 15 mg daily was started.  He converted spontaneously back to NSR.    He suffered a ventricular fibrillation arrest in 3/18.  Luckily, he was wearing  a Lifevest and was shocked.  He was admitted and started on amiodarone.  Medtronic ICD was placed.  Echo 3/18 with EF 20-25%.   Admitted again 6/18 due to VF arrest. ICD lead had been undersensing leading to prolongation of time to discharge.  He underwent RV lead extraction with new lead implant.   He was admitted again with VT storm in 7/18.  No prior chest pain or symptoms of worsening CAD.  He had VT ablation this admission.   Admitted with ICD shock 07/05/17. He had coronary angiography this admission.  Had cutting balloon angioplasty of severe in-stent restenosis in the proximal LAD. Pt had additional VT 07/07/17, EP saw and recommended BB titration. Po amio up-titrated. Discharge weight 228  lbs  He had TURBT 11/18, urothelial cancer diagnosed.    He returns today for regular follow up. Overall doing okay. SOB is a little worse over the last couple days [place], but has improved now that he's back in town. Activity is limited by left leg pain. Denies orthopnea or PND. LLE swells after standing for a long time. He is SOB going up hills. No CP. He has some dizziness when he moves his head quickly, but not with sitting to standing. He had a small amount of blood in urine for 3 days recently. He has been eating a lot of cookies, cakes, and junk food. Still tries to limit fluid and salt intake. Weights up about 8 lbs on home scale, gradually over the last few months, now 234 lbs. SBP 130s at home. Taking all medications, occasionally misses a lasix dose when he's running errands.   Echo today EF 25-30%, diffuse HK with inferior AK, mild AS, normal RV.    Medtronic device interrogated: No AF/VT. Thoracic impedence just above threshold. Active 2-3 hours daily   Labs (12/16): K 4.9, creatinine 1.23 Labs (1/17): K 4.7, creatinine 1.15, BNP 1433 Labs (2/17): K 4.7, creatinine 1.14, BNP 870 Labs (3/17): K 4.5, creatinine 1.10, BNP 1257 Labs (5/17): K 4.2, creatinine 1.2, BNP 818 Labs (7/17): LDL 149, HDL 40, TGs 210 Labs (9/17): K 4.9, creatinine 1.36 Labs (10/17): K 3.7, creatinine 1.2, LDL 87, HDL 32, LFTs normal Labs (12/17): K 4.5, creatinine 1.34, digoxin 0.3 Labs (2/18): K 4.4, creatinine 1.48, hgb 11.8 Labs (3/18): K 5 => 4.6, creatinine 1.54 => 1.46, digoxin 1.3 => 0.3 Labs (5/18): TSH normal Labs (7/18): K 3.7, creatinine 1.33, hgb 11.4, digoxin 0.6, LFTs normal Labs (8/18): K 5.1, creatinine 1.47, hgb 13.3 Labs (10/18): LDL 50, HDL 40, TGs 247 Labs (11/18): K 5.9, creatinine 1.42, hgb 15.2 Labs (12/18): K 5, creatinine 1.27, digoxin 0.6, LFTs normal, TSH normal Labs (3/19): LDL 86, HDL 45 Labs (6/19): K 4.1, creatinine 1.34, LFTs normal  Past Medical  History:  1. HYPERTENSION  2. HYPERLIPIDEMIA: Myalgias with atorvastatin and Crestor 3. ECZEMA  4. RHINITIS  5. HERPES ZOSTER OPHTHALMICUS  6. ADENOCARCINOMA, PROSTATE: Status post prostatectomy in 2009. Has had some incontinence since then.  7. Diabetes mellitus type II  8. Arthritis  9. Obesity  10. GERD: rare  11. CAD: Presented with exertional dyspnea, never had chest pain. LHC (2/12) with subtotalled proximal RCA and left to right collaterals, 90% proximal moderate-sized ramus, 95% proximal relatively small CFX, 40-50% proximal LAD. Cardiac MRI (2/12) showed EF 21%, some mild scar in basal segments but all wall segments would be expected to be viable. DES x 5 (overlapping) to RCA, DES x 1 to  RI 01/30/11 .  Bled into leg with Effient use (long, complicated course).  LHC (1/14): AV LCx small with subtotal occlusion, 95% ostial instent restenosis in RCA. PCI in 2/14 to ostial RCA with 3.5 x 15 Xience DES.  Plavix allergy (hives) so put on ticlopidine.  - LHC (12/17):  the RCA was totally occluded with collaterals, there was 95% ostial to mid LAD stenosis, patient had PCI with DES to proximal-mid LAD. - LHC (7/18): Totally occluded RCA with left to right collaterals, subtotal occlusion of small AV LCx, 90% ISR proximal LAD => cutting balloon angioplasty.  12. Ischemic CMP: Echo (2/12) with moderately dilated LV, EF about 20% with diffuse hypokinesis and inferior akinesis, pseudonormal diastolic function, mild MR, severe LAE, mildly decreased RV systolic function. RHC (2/12) with mean RA 12, PA 40/25, mean PCWP 26, CI 2.1.  Echo (5/12) with EF 40% (appeared worse to my eye) with posterior HK, basal inferior AK, inferoseptal AK, basal anteroseptal AK, mild MR.  Cardiac MRI was repeated and showed EF 32% (improved from 21%) and mild LV dilation (was severely dilated before) with diffuse hypokinesis and subendocardial scar in the basal inferior, basal posterior, and basal anterolateral segments.  Echo  (2/13) with EF 45%, moderate LV dilation, mild LVH.  Echo (1/14) with EF 15%, diffuse hypokinesis, inferoposterior akinesis, mild MR, grade I diastolic dysfunction.  Echo (5/14) with EF 30-35%, mild LV dilation, akinesis of the basal inferior wall otherwise diffuse hypokinesis.  Echo (12/14) with EF 30-35%, mild LV dilation, diffuse hypokinesis with basal inferior and posterior akinesis. Echo (12/15) with EF 35%, mildly dilated LV, wall motion abnormalities, normal RV size and systolic function. Echo (12/16) with EF 20-25%, diffuse hypokinesis, severe LV dilation, mild MR.  - Echo (10/17): EF 15%, grade II diastolic dysfunction, mild MR, mildly decreased RV systolic function.  - Possible angioedema related to Entresto.  - Hyperkalemia with spironolactone 12.5 daily.  - CPX (10/17): peak VO2 10.6, VE/VCO2 slope 48.6, RER 1.12.  Severe HF limitation.  - RHC (12/17): mean RA 14, PA 53/27, mean PCWP 25, CI 1.93.  - Echo (3/18): EF 20-25%, moderate LV dilation, mild LVH, normal RV size and systolic function, mild aortic stenosis.  - Medtronic ICD 3/18.  - Echo (6/18): EF 25-30%, mild AS.  - Echo (7/19): Moderate LV dilation, EF 25-30%, diffuse hypokinesis with inferior AK, normal RV size and systolic function, mild AS.  13. Cervical OA.  14. Polymyalgia rheumatica 15. OSA: Severe on sleep study 10/12.  On CPAP.  16. Left lower leg hematoma in setting of Effient use.  He developed a left gastrocnemius abscess and septic shock with prolonged hospitalization beginning in 7/13.  17. PAD: Lower extremity arterial doppler evaluation in 2/17 showed occluded peroneal arteries bilaterally, ABI 1.1 (R) and 1.0 (L).   - ABIs normal in 1/19.  18. Carotid dopplers (10/17) with minimal disease.  19. Atrial fibrillation: Paroxysmal.  20. Ventricular fibrillation arrest: 3/18.  Medtronic ICD placed, now on amiodarone.  - VT storm 7/18, now s/p VT ablation.  21. Aortic stenosis: Mild on 6/18 echo and 7/19 echo.  22.  Urothelial cancer: s/p TURBT of bladder tumor 11/18.   Family History:  Father died with MI at age 53. He was an alcoholic. Mother died with cancer at around 47.   Social History:  Occupation: retired Administrator  Married, lives in [place]  Past smoker, quit around [place] of systems complete and found to be negative unless listed in  HPI.      Current Outpatient Medications  Medication Sig Dispense Refill  . ACCU-CHEK AVIVA PLUS test strip CHECK BLOOD SUGAR ONCE TO TWICE DAILY. 200 each 0  . amiodarone (PACERONE) 200 MG tablet Take one tablet by mouth Monday through Saturday.  Do NOT take on Sunday. 90 tablet 2  . carvedilol (COREG) 12.5 MG tablet Take 1 tablet (12.5 mg total) by mouth 2 (two) times daily. 60 tablet 6  . diphenhydramine-acetaminophen (TYLENOL PM) 25-500 MG TABS tablet Take 1 tablet at bedtime as needed by mouth (for sleep).     . furosemide (LASIX) 20 MG tablet Take 1 tablet (20 mg total) by mouth 2 (two) times daily. 60 tablet 3  . insulin degludec (TRESIBA FLEXTOUCH) 100 UNIT/ML SOPN FlexTouch Pen Inject 10 Units into the skin daily at 10 pm.    . losartan (COZAAR) 25 MG tablet Take 1 tablet (25 mg total) by mouth 2 (two) times daily. 60 tablet 3  . Multiple Vitamins-Minerals (CENTRUM SILVER ADULT 50+) TABS Take 1 tablet by mouth daily.    . Omega-3 Fatty Acids (FISH OIL) 1000 MG CAPS Take 1,000 mg by mouth 2 (two) times daily.     . rivaroxaban (XARELTO) 20 MG TABS tablet Take 1 tablet (20 mg total) by mouth daily with supper. 90 tablet 3  . rosuvastatin (CRESTOR) 10 MG tablet Take 1 tablet (10 mg total) by mouth daily. 30 tablet 11  . spironolactone (ALDACTONE) 25 MG tablet Take 0.5 tablets (12.5 mg total) by mouth every evening. 15 tablet 6   No current facility-administered medications for this encounter.    Facility-Administered Medications Ordered in Other Encounters  Medication Dose Route Frequency Provider Last Rate Last Dose  . epirubicin (ELLENCE)  50 mg in sodium chloride 0.9 % bladder instillation  50 mg Bladder Instillation Once Festus Aloe, MD        Vitals:   06/22/18 1115  BP: 130/72  Pulse: 63  SpO2: 96%  Weight: 243 lb (110.2 kg)   Wt Readings from Last 3 Encounters:  06/22/18 243 lb (110.2 kg)  05/25/18 242 lb (109.8 kg)  05/24/18 241 lb (109.3 kg)   General: No resp difficulty. Walked in with cane HEENT: Normal Neck: Supple. JVP ~7. Carotids 2+ bilat; no bruits. No thyromegaly or nodule noted. Cor: PMI nondisplaced. RRR, 1/6 SEM RUSB Lungs: CTAB, normal effort. Abdomen: Soft, non-tender, non-distended, no HSM. No bruits or masses. +BS  Extremities: No cyanosis, clubbing, or rash. R and LLE 1+ edema Neuro: Alert & orientedx3, cranial nerves grossly intact. moves all 4 extremities w/o difficulty. Affect pleasant  Assessment/Plan:  1. Chronic systolic CHF: Ischemic cardiomyopathy.  6/18 echo with EF 25-30%, diffuse hypokinesis.  CPX in 10/17 with severe HF limitation.  RHC in 12/17 with elevated filling pressures and low cardiac output.  Medtronic ICD in 3/18 after vfib arrest. He had VT again in 6/18, then VT storm in 7/18 now s/p VT ablation.  - Echo today EF 25-30% - NYHA class II symptoms - Volume status okay on exam, optivol, and echo.  - Continue lasix 20 mg BID.  - Continue Coreg at 12.5 mg BID - Increase spiro to 25 mg daily. CMET 10 days - Continue losartan 25 mg bid. He had angioedema with Entresto.   - He is off digoxin and would like to remain off - He may be an LVAD candidate in the future.   - Reinforced fluid restriction to < 2 L daily, sodium restriction to  less than 2000 mg daily, and the importance of daily weights.   2. CAD:  Now s/p cutting balloon angioplasty to in-stent restenosis in proximal LAD.  He has an occluded RCA but there were left to right collaterals.   - No s/s ischemia.  - He has been on Repatha with good lipid control.  Unfortunately, he will not be able to get Repatha in  the future as he does not have Medicare part D.   - Continue Crestor 10 mg daily. LDL 86 02/2018 - He is not on ASA given Xarelto use.  3. HLD: See discussion above regarding restarting Crestor above.  4. PAD: Normal ABIs in 1/19.   No change.  5. Diabetes type II: Continue empagliflozin. No change 6. Atrial fibrillation:  - NSR today.  - Continue Xarelto 20 mg daily. - Continue amiodarone 200 mg daily. LFTs okay 05/2018.  He will need regular eye exams.  - Would consider atrial fibrillation ablation based on CASTLE-HF data if AF recurs. 7. Ventricular fibrillation arrest: On amiodarone and has Medtronic ICD.  He had VT ablation in 7/18 after episode of VT storm.  - No VT on ICD interrogation.  8. OSA:  - Not using CPAP.  9. CKD: Stage 3.  - CMET in 10 days 10. Bladder Cancer - Underwent cystoscopy with biopsy on 05/25/18 - Starting treatments tomorrow - Had a little bit or blood in urine for 3 days recently.   Increase spiro to 25 mg daily CMET, TSH, and CBC in 10 days Follow up in 3 months  Georgiana Shore, NP  06/22/2018   Patient seen with NP, agree with the above note.  Stable NYHA class II symptoms.  More limited by left knee pain from OA.  No chest pain. He has occasional symptoms suggestive of positional vertigo.    On exam, he is not volume overloaded.  Optivol suggests euvolemia as well.  No VT/AF on device interrogation.   He has a mild tremor that may be attributable to amiodarone.  Dr. Lovena Le recently decreased amiodarone to 6 days/week.  Check LFTs/TSH with labs in 10 days. He will need a regular eye exam.   I will increase spironolactone to 25 mg daily today.  BMET in 10 days.   Volume status ok by exam and Optivol, continue current Lasix.   He has not been able to get Repatha and is back on Crestor 10 mg daily.  He cannot tolerate more Crestor due to myalgias.  LDL 89.  He can enroll in Medicare part D in the fall.  At that time, would like to get him back on Repatha  in addition to statin.   Followup in 3 months.   Loralie Champagne 06/23/2018

## 2018-06-22 NOTE — Progress Notes (Signed)
  Echocardiogram 2D Echocardiogram has been performed.  Jennette Dubin 06/22/2018, 10:50 AM

## 2018-06-23 DIAGNOSIS — C674 Malignant neoplasm of posterior wall of bladder: Secondary | ICD-10-CM | POA: Diagnosis not present

## 2018-06-25 ENCOUNTER — Telehealth: Payer: Self-pay | Admitting: *Deleted

## 2018-06-25 NOTE — Telephone Encounter (Signed)
Will send to [facility] as we do not have any samples of Tresiba 100mg  on hand, only 200mg .   Copied from [place] [id number]. Topic: General - Other >> Jun 25, 2018  8:36 AM Alfredia Ferguson R wrote: Pt called in and stated he is out of the samples of insulin that was provided to him in the office Flex Touch Tyler Aas and would like to get more.  Callback # [phone]

## 2018-06-28 DIAGNOSIS — M5411 Radiculopathy, occipito-atlanto-axial region: Secondary | ICD-10-CM | POA: Diagnosis not present

## 2018-06-28 DIAGNOSIS — M9903 Segmental and somatic dysfunction of lumbar region: Secondary | ICD-10-CM | POA: Diagnosis not present

## 2018-06-28 DIAGNOSIS — M5432 Sciatica, left side: Secondary | ICD-10-CM | POA: Diagnosis not present

## 2018-06-28 DIAGNOSIS — M9901 Segmental and somatic dysfunction of cervical region: Secondary | ICD-10-CM | POA: Diagnosis not present

## 2018-06-28 NOTE — Telephone Encounter (Signed)
Wife is aware that we do not have samples at this time.

## 2018-06-30 DIAGNOSIS — M5411 Radiculopathy, occipito-atlanto-axial region: Secondary | ICD-10-CM | POA: Diagnosis not present

## 2018-06-30 DIAGNOSIS — M9901 Segmental and somatic dysfunction of cervical region: Secondary | ICD-10-CM | POA: Diagnosis not present

## 2018-06-30 DIAGNOSIS — M9903 Segmental and somatic dysfunction of lumbar region: Secondary | ICD-10-CM | POA: Diagnosis not present

## 2018-06-30 DIAGNOSIS — R310 Gross hematuria: Secondary | ICD-10-CM | POA: Diagnosis not present

## 2018-06-30 DIAGNOSIS — M5432 Sciatica, left side: Secondary | ICD-10-CM | POA: Diagnosis not present

## 2018-07-01 DIAGNOSIS — M5432 Sciatica, left side: Secondary | ICD-10-CM | POA: Diagnosis not present

## 2018-07-01 DIAGNOSIS — M5411 Radiculopathy, occipito-atlanto-axial region: Secondary | ICD-10-CM | POA: Diagnosis not present

## 2018-07-01 DIAGNOSIS — M9901 Segmental and somatic dysfunction of cervical region: Secondary | ICD-10-CM | POA: Diagnosis not present

## 2018-07-01 DIAGNOSIS — M9903 Segmental and somatic dysfunction of lumbar region: Secondary | ICD-10-CM | POA: Diagnosis not present

## 2018-07-02 ENCOUNTER — Other Ambulatory Visit (HOSPITAL_COMMUNITY): Payer: Medicare Other

## 2018-07-05 ENCOUNTER — Telehealth: Payer: Self-pay | Admitting: Family Medicine

## 2018-07-05 DIAGNOSIS — M5432 Sciatica, left side: Secondary | ICD-10-CM | POA: Diagnosis not present

## 2018-07-05 DIAGNOSIS — M9901 Segmental and somatic dysfunction of cervical region: Secondary | ICD-10-CM | POA: Diagnosis not present

## 2018-07-05 DIAGNOSIS — M5411 Radiculopathy, occipito-atlanto-axial region: Secondary | ICD-10-CM | POA: Diagnosis not present

## 2018-07-05 DIAGNOSIS — M9903 Segmental and somatic dysfunction of lumbar region: Secondary | ICD-10-CM | POA: Diagnosis not present

## 2018-07-05 NOTE — Telephone Encounter (Signed)
Medication Samples have been provided to the patient.  Drug name: tresiba     Strength: 100 units      Qty: 2 pens LOT: [id number]  Exp.Date:12/20    The patient has been instructed regarding the correct time, dose, and frequency of taking this medication, including desired effects and most common side effects.   Westley Hummer 12:50 PM 07/05/2018

## 2018-07-05 NOTE — Telephone Encounter (Signed)
Copied from [place] [id number]. Topic: Quick Communication - See Telephone Encounter >> Jul 05, 2018 10:15 AM Percell Belt A wrote: CRM for notification. See Telephone encounter for: 07/05/18.  Pt called in and would like to know if there is anyway he could get a sample of the insulin degludec (TRESIBA FLEXTOUCH) 100 UNIT/ML SOPN FlexTouch Pen [[id number]] .  He stated DR wanted him to use a sample for a while to see how this med would work?    Please advise Best number - [phone]

## 2018-07-06 ENCOUNTER — Encounter: Payer: Medicare Other | Admitting: *Deleted

## 2018-07-06 ENCOUNTER — Ambulatory Visit (HOSPITAL_COMMUNITY)
Admission: RE | Admit: 2018-07-06 | Discharge: 2018-07-06 | Disposition: A | Discharge status: Home / Self Care | Payer: Medicare Other | Source: Ambulatory Visit | Attending: Cardiology | Admitting: Cardiology

## 2018-07-06 VITALS — BP 118/62 | HR 65 | Wt 241.6 lb

## 2018-07-06 DIAGNOSIS — I5022 Chronic systolic (congestive) heart failure: Secondary | ICD-10-CM | POA: Diagnosis not present

## 2018-07-06 DIAGNOSIS — M5411 Radiculopathy, occipito-atlanto-axial region: Secondary | ICD-10-CM | POA: Diagnosis not present

## 2018-07-06 DIAGNOSIS — M9901 Segmental and somatic dysfunction of cervical region: Secondary | ICD-10-CM | POA: Diagnosis not present

## 2018-07-06 DIAGNOSIS — M9903 Segmental and somatic dysfunction of lumbar region: Secondary | ICD-10-CM | POA: Diagnosis not present

## 2018-07-06 DIAGNOSIS — Z006 Encounter for examination for normal comparison and control in clinical research program: Secondary | ICD-10-CM

## 2018-07-06 DIAGNOSIS — M5432 Sciatica, left side: Secondary | ICD-10-CM | POA: Diagnosis not present

## 2018-07-06 LAB — CBC
HCT: 49.6 % (ref 39.0–52.0)
Hemoglobin: 16 g/dL (ref 13.0–17.0)
MCH: 28.3 pg (ref 26.0–34.0)
MCHC: 32.3 g/dL (ref 30.0–36.0)
MCV: 87.8 fL (ref 78.0–100.0)
Platelets: 141 10*3/uL — ABNORMAL LOW (ref 150–400)
RBC: 5.65 MIL/uL (ref 4.22–5.81)
RDW: 13.2 % (ref 11.5–15.5)
WBC: 5.6 10*3/uL (ref 4.0–10.5)

## 2018-07-06 LAB — COMPREHENSIVE METABOLIC PANEL
ALK PHOS: 93 U/L (ref 38–126)
ALT: 26 U/L (ref 0–44)
AST: 22 U/L (ref 15–41)
Albumin: 3.7 g/dL (ref 3.5–5.0)
Anion gap: 8 (ref 5–15)
BUN: 21 mg/dL (ref 8–23)
CALCIUM: 9.6 mg/dL (ref 8.9–10.3)
CHLORIDE: 103 mmol/L (ref 98–111)
CO2: 25 mmol/L (ref 22–32)
CREATININE: 1.44 mg/dL — AB (ref 0.61–1.24)
GFR, EST AFRICAN AMERICAN: 54 mL/min — AB (ref >60)
GFR, EST NON AFRICAN AMERICAN: 46 mL/min — AB (ref >60)
Glucose, Bld: 429 mg/dL — ABNORMAL HIGH (ref 70–99)
Potassium: 4.5 mmol/L (ref 3.5–5.1)
SODIUM: 136 mmol/L (ref 135–145)
Total Bilirubin: 0.8 mg/dL (ref 0.3–1.2)
Total Protein: 6.6 g/dL (ref 6.5–8.1)

## 2018-07-06 LAB — TSH: TSH: 2.101 u[IU]/mL (ref 0.350–4.500)

## 2018-07-07 DIAGNOSIS — Z8551 Personal history of malignant neoplasm of bladder: Secondary | ICD-10-CM | POA: Diagnosis not present

## 2018-07-08 DIAGNOSIS — M9903 Segmental and somatic dysfunction of lumbar region: Secondary | ICD-10-CM | POA: Diagnosis not present

## 2018-07-08 DIAGNOSIS — M5432 Sciatica, left side: Secondary | ICD-10-CM | POA: Diagnosis not present

## 2018-07-08 DIAGNOSIS — M9901 Segmental and somatic dysfunction of cervical region: Secondary | ICD-10-CM | POA: Diagnosis not present

## 2018-07-08 DIAGNOSIS — M5411 Radiculopathy, occipito-atlanto-axial region: Secondary | ICD-10-CM | POA: Diagnosis not present

## 2018-07-09 NOTE — Progress Notes (Signed)
Beat HF visit Wayne [name]  Subject met inclusion and exclusion criteria. The informed consent form, study requirements and expectation were reviewed with the subject and questions and concerns were addressed prior to the signing of the consent form. The subject verbalized understanding of the trial requirements. The subject agreed to participate in the BeAT HF trial and singed the informed consent. The informed consent was obtained prior to performance of any protocol-specific procedure for the subject. A copy of the signed informed consent was given to the subject and a copy was placed in the subject's medical record.  Subject re -consented to [place]  Rev G v0.1 IRB approved 03/26/18

## 2018-07-12 DIAGNOSIS — M9903 Segmental and somatic dysfunction of lumbar region: Secondary | ICD-10-CM | POA: Diagnosis not present

## 2018-07-12 DIAGNOSIS — M5411 Radiculopathy, occipito-atlanto-axial region: Secondary | ICD-10-CM | POA: Diagnosis not present

## 2018-07-12 DIAGNOSIS — M5432 Sciatica, left side: Secondary | ICD-10-CM | POA: Diagnosis not present

## 2018-07-12 DIAGNOSIS — M9901 Segmental and somatic dysfunction of cervical region: Secondary | ICD-10-CM | POA: Diagnosis not present

## 2018-07-13 DIAGNOSIS — M5411 Radiculopathy, occipito-atlanto-axial region: Secondary | ICD-10-CM | POA: Diagnosis not present

## 2018-07-13 DIAGNOSIS — M9901 Segmental and somatic dysfunction of cervical region: Secondary | ICD-10-CM | POA: Diagnosis not present

## 2018-07-13 DIAGNOSIS — M9903 Segmental and somatic dysfunction of lumbar region: Secondary | ICD-10-CM | POA: Diagnosis not present

## 2018-07-13 DIAGNOSIS — M5432 Sciatica, left side: Secondary | ICD-10-CM | POA: Diagnosis not present

## 2018-07-14 DIAGNOSIS — Z5111 Encounter for antineoplastic chemotherapy: Secondary | ICD-10-CM | POA: Diagnosis not present

## 2018-07-14 DIAGNOSIS — Z8551 Personal history of malignant neoplasm of bladder: Secondary | ICD-10-CM | POA: Diagnosis not present

## 2018-07-19 DIAGNOSIS — M9903 Segmental and somatic dysfunction of lumbar region: Secondary | ICD-10-CM | POA: Diagnosis not present

## 2018-07-19 DIAGNOSIS — M9901 Segmental and somatic dysfunction of cervical region: Secondary | ICD-10-CM | POA: Diagnosis not present

## 2018-07-19 DIAGNOSIS — M5432 Sciatica, left side: Secondary | ICD-10-CM | POA: Diagnosis not present

## 2018-07-19 DIAGNOSIS — M5411 Radiculopathy, occipito-atlanto-axial region: Secondary | ICD-10-CM | POA: Diagnosis not present

## 2018-07-21 DIAGNOSIS — D414 Neoplasm of uncertain behavior of bladder: Secondary | ICD-10-CM | POA: Diagnosis not present

## 2018-07-21 DIAGNOSIS — Z5111 Encounter for antineoplastic chemotherapy: Secondary | ICD-10-CM | POA: Diagnosis not present

## 2018-07-22 DIAGNOSIS — M9903 Segmental and somatic dysfunction of lumbar region: Secondary | ICD-10-CM | POA: Diagnosis not present

## 2018-07-22 DIAGNOSIS — M5432 Sciatica, left side: Secondary | ICD-10-CM | POA: Diagnosis not present

## 2018-07-22 DIAGNOSIS — M5411 Radiculopathy, occipito-atlanto-axial region: Secondary | ICD-10-CM | POA: Diagnosis not present

## 2018-07-22 DIAGNOSIS — M9901 Segmental and somatic dysfunction of cervical region: Secondary | ICD-10-CM | POA: Diagnosis not present

## 2018-07-26 DIAGNOSIS — M5411 Radiculopathy, occipito-atlanto-axial region: Secondary | ICD-10-CM | POA: Diagnosis not present

## 2018-07-26 DIAGNOSIS — M9901 Segmental and somatic dysfunction of cervical region: Secondary | ICD-10-CM | POA: Diagnosis not present

## 2018-07-26 DIAGNOSIS — M9903 Segmental and somatic dysfunction of lumbar region: Secondary | ICD-10-CM | POA: Diagnosis not present

## 2018-07-26 DIAGNOSIS — M5432 Sciatica, left side: Secondary | ICD-10-CM | POA: Diagnosis not present

## 2018-07-28 ENCOUNTER — Telehealth: Payer: Self-pay | Admitting: Family Medicine

## 2018-07-28 NOTE — Telephone Encounter (Signed)
Dr. Elease Hashimoto,  The patient is calling and asking for samples of the tresiba pen.  Pt was given samples on 07/05/2018.  Please advise if you are ok with samples given.  Thank you.

## 2018-07-28 NOTE — Telephone Encounter (Signed)
Copied from [place] [id number]. Topic: Inquiry >> Jul 28, 2018  2:04 PM Pricilla Handler wrote: Reason for CRM: Patient called requesting addition samples of insulin degludec (TRESIBA FLEXTOUCH) 100 UNIT/ML SOPN FlexTouch Pen. Patient states that he received some from Dr. Elease Hashimoto, and would like more samples. Please call patient.       Thank You!!!

## 2018-07-28 NOTE — Telephone Encounter (Signed)
Samples of this drug were given to the patient, quantity 2 pens, Lot Number [id number], Exp 10/20 Tyler Aas

## 2018-07-29 DIAGNOSIS — M5411 Radiculopathy, occipito-atlanto-axial region: Secondary | ICD-10-CM | POA: Diagnosis not present

## 2018-07-29 DIAGNOSIS — M5432 Sciatica, left side: Secondary | ICD-10-CM | POA: Diagnosis not present

## 2018-07-29 DIAGNOSIS — M9901 Segmental and somatic dysfunction of cervical region: Secondary | ICD-10-CM | POA: Diagnosis not present

## 2018-07-29 DIAGNOSIS — M9903 Segmental and somatic dysfunction of lumbar region: Secondary | ICD-10-CM | POA: Diagnosis not present

## 2018-08-02 DIAGNOSIS — M5432 Sciatica, left side: Secondary | ICD-10-CM | POA: Diagnosis not present

## 2018-08-02 DIAGNOSIS — M5411 Radiculopathy, occipito-atlanto-axial region: Secondary | ICD-10-CM | POA: Diagnosis not present

## 2018-08-02 DIAGNOSIS — M9901 Segmental and somatic dysfunction of cervical region: Secondary | ICD-10-CM | POA: Diagnosis not present

## 2018-08-02 DIAGNOSIS — M9903 Segmental and somatic dysfunction of lumbar region: Secondary | ICD-10-CM | POA: Diagnosis not present

## 2018-08-04 DIAGNOSIS — Z8551 Personal history of malignant neoplasm of bladder: Secondary | ICD-10-CM | POA: Diagnosis not present

## 2018-08-04 DIAGNOSIS — Z5111 Encounter for antineoplastic chemotherapy: Secondary | ICD-10-CM | POA: Diagnosis not present

## 2018-08-09 DIAGNOSIS — M9901 Segmental and somatic dysfunction of cervical region: Secondary | ICD-10-CM | POA: Diagnosis not present

## 2018-08-09 DIAGNOSIS — M5411 Radiculopathy, occipito-atlanto-axial region: Secondary | ICD-10-CM | POA: Diagnosis not present

## 2018-08-09 DIAGNOSIS — M9903 Segmental and somatic dysfunction of lumbar region: Secondary | ICD-10-CM | POA: Diagnosis not present

## 2018-08-09 DIAGNOSIS — M5432 Sciatica, left side: Secondary | ICD-10-CM | POA: Diagnosis not present

## 2018-08-11 ENCOUNTER — Ambulatory Visit: Payer: Medicare Other | Admitting: Family Medicine

## 2018-08-11 DIAGNOSIS — D414 Neoplasm of uncertain behavior of bladder: Secondary | ICD-10-CM | POA: Diagnosis not present

## 2018-08-11 DIAGNOSIS — Z5111 Encounter for antineoplastic chemotherapy: Secondary | ICD-10-CM | POA: Diagnosis not present

## 2018-08-17 ENCOUNTER — Ambulatory Visit (INDEPENDENT_AMBULATORY_CARE_PROVIDER_SITE_OTHER): Payer: Medicare Other | Admitting: Family Medicine

## 2018-08-17 ENCOUNTER — Encounter: Payer: Self-pay | Admitting: Family Medicine

## 2018-08-17 VITALS — BP 102/54 | HR 70 | Temp 98.3°F | Wt 247.3 lb

## 2018-08-17 DIAGNOSIS — I2510 Atherosclerotic heart disease of native coronary artery without angina pectoris: Secondary | ICD-10-CM

## 2018-08-17 DIAGNOSIS — Z23 Encounter for immunization: Secondary | ICD-10-CM | POA: Diagnosis not present

## 2018-08-17 DIAGNOSIS — E11649 Type 2 diabetes mellitus with hypoglycemia without coma: Secondary | ICD-10-CM | POA: Diagnosis not present

## 2018-08-17 LAB — POCT GLYCOSYLATED HEMOGLOBIN (HGB A1C): Hemoglobin A1C: 10.6 % — AB (ref 4.0–5.6)

## 2018-08-17 NOTE — Progress Notes (Signed)
Subjective:     Patient ID: Wayne [name], male   DOB: March 11, 1941, 77 y.o.   MRN: [id number]  HPI Patient here for follow-up diabetes. History of poor control. Poor compliance with diet. We added Tyler Aas currently taking 10 units once daily. We have advised him titrating this up 2 units every few days until fasting blood sugars consistently less than 1:30. However, he did not titrate up. He has several fasting blood sugars over 200. He is not a good candidate for several medications because of his chronic kidney disease and history of systolic heart failure  Past Medical History:  Diagnosis Date  . AICD (automatic cardioverter/defibrillator) present   . Arthritis   . Cancer [facility])    bladder and prostate  . Cellulitis of left leg    a. 03/2682 complicated by septic shock  . Chronic systolic CHF (congestive heart failure) ([facility])   . CKD (chronic kidney disease), stage III ([place])    pt denies  . Colon polyps   . CORONARY ATHEROSCLEROSIS NATIVE CORONARY ARTERY cardiologist-  dr Aundra Dubin   a. 01/2011 Cath/PCI: LM nl, LAD 40-50p, D1 80-small, LCX 95-small, RI 90, RCA 100, EF 20%;  b. 01/2011 Card MRI - No transmural scar;  c. 01/2011 PCI RCA->5 Promus DES, RI->3.0x16 Promus DES; d. Cath 01/13/13 patent LAD & Ramus, diffuse LCx dz, RCA mult overlapping stents w/ 95% osital stenosis, EF 20%, s/p DES to ostial/prox RCA 01/24/13   . Diabetes mellitus, type 2 ([place])    type 2  . GERD (gastroesophageal reflux disease)   . Hematoma of leg    a. left leg hematoma 03/2012 in the setting of asa/effient  . Herpes zoster ophthalmicus   . History of non-ST elevation myocardial infarction (NSTEMI) 06/2012  . History of prostate cancer    dx 2009---  s/p radioactive seed implants 11-24-2008  . HYPERLIPIDEMIA    intolerant to Lipitor (myalgias)  . HYPERTENSION   . Ischemic cardiomyopathy    a. 01/2012 Echo EF 45%, mild LVH; b. MH96%, grade 1 diastolic dysfunction, diffuse hypokinesis, inferoposterior akinesis   .  Myocardial infarction ([place])    x2  . Noncompliance   . Obesity   . OSA (obstructive sleep apnea)    dx 2012  severe osa  no cpap just uses nose strips  . PAF (paroxysmal atrial fibrillation) ([place])    first dx 01-16-2017  . Polymyalgia rheumatica ([place])   . S/P drug eluting coronary stent placement    total 6 DES   Past Surgical History:  Procedure Laterality Date  . CARDIAC CATHETERIZATION  01/24/2013  . CARDIAC CATHETERIZATION N/A 11/14/2016   Procedure: Right/Left Heart Cath and Coronary Angiography;  Surgeon: Larey Dresser, MD;  Location: [place] [facility];  Service: Cardiovascular;  Laterality: N/A;  . CARDIAC CATHETERIZATION N/A 11/17/2016   Procedure: Coronary Stent Intervention w/Impella;  Surgeon: Peter M Martinique, MD;  Location: [place] [facility];  Service: Cardiovascular;  Laterality: N/A;  . CARDIAC CATHETERIZATION N/A 11/17/2016   Procedure: Coronary Atherectomy;  Surgeon: Peter M Martinique, MD;  Location: [place] [facility];  Service: Cardiovascular;  Laterality: N/A;  . CORONARY ANGIOPLASTY WITH STENT PLACEMENT  01-30-2011   dr Aundra Dubin   PTCA and multiple overlapping DEStenting to RCA and 1 DES to RI   . CORONARY BALLOON ANGIOPLASTY N/A 07/06/2017   Procedure: Coronary Balloon Angioplasty;  Surgeon: Sherren Mocha, MD;  Location: [place] [facility];  Service: Cardiovascular;  Laterality: N/A;  . CYSTOSCOPY WITH FULGERATION  N/A 05/25/2018   Procedure: CYSTOSCOPY WITH FULGERATION/BLADDER BIOPSY , 15 mm lesion;  Surgeon: Festus Aloe, MD;  Location: [facility];  Service: Urology;  Laterality: N/A;  . I&D EXTREMITY  06/15/2012   Procedure: IRRIGATION AND DEBRIDEMENT EXTREMITY;  Surgeon: Newt Minion, MD;  Location: [place];  Service: Orthopedics;  Laterality: Left;  I&D Left Posterior Knee  . I&D EXTREMITY  06/30/2012   Procedure: IRRIGATION AND DEBRIDEMENT EXTREMITY;  Surgeon: Newt Minion, MD;  Location: [place];  Service: Orthopedics;  Laterality: Left;  Left Leg Irrigation and  Debridement and placement of Wound VAC and application of  A-cell  . I&D EXTREMITY  07/20/2012   Procedure: IRRIGATION AND DEBRIDEMENT EXTREMITY;  Surgeon: Newt Minion, MD;  Location: [place];  Service: Orthopedics;  Laterality: Left;  Irrigation and Debridement Left Leg and Place antibiotic beads   . ICD IMPLANT N/A 02/12/2017   Procedure: ICD Implant;  Surgeon: Evans Lance, MD;  Location: [place] [facility];  Service: Cardiovascular;  Laterality: N/A;  . LAPAROSCOPIC INGUINAL HERNIA REPAIR Right 09-11-2008   dr Barry Dienes  [facility]  . LEAD REVISION/REPAIR N/A 05/26/2017   Procedure: Lead Revision/Repair;  Surgeon: Evans Lance, MD;  Location: [place] [facility];  Service: Cardiovascular;  Laterality: N/A;  . LEFT HEART CATH AND CORONARY ANGIOGRAPHY N/A 07/06/2017   Procedure: Left Heart Cath and Coronary Angiography;  Surgeon: Larey Dresser, MD;  Location: [place] [facility];  Service: Cardiovascular;  Laterality: N/A;  . PERCUTANEOUS CORONARY STENT INTERVENTION (PCI-S) N/A 01/24/2013   Procedure: PERCUTANEOUS CORONARY STENT INTERVENTION (PCI-S);  Surgeon: Wellington Hampshire, MD;  Location: [facility];  Service: Cardiovascular;  Laterality: N/A;  . RADIOACTIVE PROSTATE SEED IMPLANTS  11-24-2008   dr Tresa Endo  [facility]  . TRANSURETHRAL RESECTION OF BLADDER TUMOR N/A 10/30/2017   Procedure: TRANSURETHRAL RESECTION OF BLADDER TUMOR (TURBT)/ INSTILLATION OF EPIRUBICIN;  Surgeon: Festus Aloe, MD;  Location: [facility];  Service: Urology;  Laterality: N/A;  . ULTRASOUND GUIDANCE FOR VASCULAR ACCESS  11/14/2016   Procedure: Ultrasound Guidance For Vascular Access;  Surgeon: Larey Dresser, MD;  Location: [place] [facility];  Service: Cardiovascular;;  . Stephanie Coup ABLATION N/A 06/19/2017   Procedure: Stephanie Coup Ablation;  Surgeon: Evans Lance, MD;  Location: [place] [facility];  Service: Cardiovascular;  Laterality: N/A;    reports that he quit smoking about 29 years ago. His smoking use included cigarettes. He  has a 6.00 pack-year smoking history. He has never used smokeless tobacco. He reports that he does not drink alcohol or use drugs. family history includes Alcohol abuse in his father; Cancer (age of onset: 48) in his mother; Heart attack in his brother; Heart attack (age of onset: 81) in his father; Heart disease in his father. Allergies  Allergen Reactions  . Entresto [Sacubitril-Valsartan] Swelling and Other (See Comments)    Angioedema  . Crestor [Rosuvastatin Calcium] Other (See Comments)    Myalgia, Interfering with Gait in high doses  . Lipitor [Atorvastatin] Other (See Comments)    Makes legs sore  . Plavix [Clopidogrel] Itching, Swelling and Other (See Comments)    Nose bleeds and welts on legs & back  . Ancef [Cefazolin] Itching, Rash and Other (See Comments)    Describes itching and rash, but said that "it wasn't that bad"     Review of Systems  Constitutional: Negative for chills and fever.  Respiratory: Negative for chest tightness and shortness of breath.  Cardiovascular: Negative for chest pain.  Endocrine: Negative for polydipsia and polyuria.       Objective:   Physical Exam  Constitutional: He appears well-developed and well-nourished.  Cardiovascular: Normal rate and regular rhythm.  Pulmonary/Chest: Effort normal and breath sounds normal.  Musculoskeletal: He exhibits no edema.       Assessment:     Type 2 diabetes poorly controlled with A1c 10.6%    Plan:     -provided further samples of Tresiba insulin -Increased to 14 units once daily and increase further 2 units every 3 days until fasting blood sugars consistently less than 130 -Discussed importance of good dietary compliance -Three-month follow-up. -Discussed possible role of short-acting meal insulin at this point we need to establish better compliance with basal insulin first  [name] MD [facility]

## 2018-08-17 NOTE — Patient Instructions (Signed)
Increase the Tresiba to 14 units and then titrate up 2 units every 3 days until fasting sugars < 130.

## 2018-08-18 DIAGNOSIS — Z5111 Encounter for antineoplastic chemotherapy: Secondary | ICD-10-CM | POA: Diagnosis not present

## 2018-08-18 DIAGNOSIS — D414 Neoplasm of uncertain behavior of bladder: Secondary | ICD-10-CM | POA: Diagnosis not present

## 2018-08-24 DIAGNOSIS — M5411 Radiculopathy, occipito-atlanto-axial region: Secondary | ICD-10-CM | POA: Diagnosis not present

## 2018-08-24 DIAGNOSIS — M9901 Segmental and somatic dysfunction of cervical region: Secondary | ICD-10-CM | POA: Diagnosis not present

## 2018-08-24 DIAGNOSIS — M5432 Sciatica, left side: Secondary | ICD-10-CM | POA: Diagnosis not present

## 2018-08-24 DIAGNOSIS — M9903 Segmental and somatic dysfunction of lumbar region: Secondary | ICD-10-CM | POA: Diagnosis not present

## 2018-09-02 DIAGNOSIS — M5411 Radiculopathy, occipito-atlanto-axial region: Secondary | ICD-10-CM | POA: Diagnosis not present

## 2018-09-02 DIAGNOSIS — M9901 Segmental and somatic dysfunction of cervical region: Secondary | ICD-10-CM | POA: Diagnosis not present

## 2018-09-02 DIAGNOSIS — M5432 Sciatica, left side: Secondary | ICD-10-CM | POA: Diagnosis not present

## 2018-09-02 DIAGNOSIS — M9903 Segmental and somatic dysfunction of lumbar region: Secondary | ICD-10-CM | POA: Diagnosis not present

## 2018-09-08 ENCOUNTER — Encounter: Payer: Medicare Other | Admitting: *Deleted

## 2018-09-08 ENCOUNTER — Telehealth: Payer: Self-pay

## 2018-09-08 NOTE — Telephone Encounter (Signed)
Spoke with pt and reminded pt of remote transmission that is due today. Pt verbalized understanding.   

## 2018-09-10 ENCOUNTER — Encounter: Payer: Self-pay | Admitting: Cardiology

## 2018-09-13 ENCOUNTER — Ambulatory Visit (INDEPENDENT_AMBULATORY_CARE_PROVIDER_SITE_OTHER): Payer: Medicare Other | Admitting: *Deleted

## 2018-09-13 DIAGNOSIS — I472 Ventricular tachycardia, unspecified: Secondary | ICD-10-CM

## 2018-09-14 ENCOUNTER — Other Ambulatory Visit (HOSPITAL_COMMUNITY): Payer: Self-pay | Admitting: Internal Medicine

## 2018-09-14 NOTE — Progress Notes (Signed)
Remote ICD transmission.   

## 2018-09-16 DIAGNOSIS — M5411 Radiculopathy, occipito-atlanto-axial region: Secondary | ICD-10-CM | POA: Diagnosis not present

## 2018-09-16 DIAGNOSIS — M5432 Sciatica, left side: Secondary | ICD-10-CM | POA: Diagnosis not present

## 2018-09-16 DIAGNOSIS — M9901 Segmental and somatic dysfunction of cervical region: Secondary | ICD-10-CM | POA: Diagnosis not present

## 2018-09-16 DIAGNOSIS — M9903 Segmental and somatic dysfunction of lumbar region: Secondary | ICD-10-CM | POA: Diagnosis not present

## 2018-09-17 LAB — CUP PACEART REMOTE DEVICE CHECK
Battery Remaining Longevity: 105 mo
Battery Voltage: 3 V
Brady Statistic RV Percent Paced: 0.03 %
Date Time Interrogation Session: 20190930212209
HighPow Impedance: 84 Ohm
Implantable Lead Location: 753860
Implantable Pulse Generator Implant Date: 20180103
Lead Channel Impedance Value: 399 Ohm
Lead Channel Impedance Value: 456 Ohm
Lead Channel Pacing Threshold Pulse Width: 0.4 ms
Lead Channel Sensing Intrinsic Amplitude: 14.5 mV
Lead Channel Setting Pacing Amplitude: 2.5 V
Lead Channel Setting Pacing Pulse Width: 0.4 ms
MDC IDC LEAD IMPLANT DT: 20180612
MDC IDC MSMT LEADCHNL RV PACING THRESHOLD AMPLITUDE: 1 V
MDC IDC MSMT LEADCHNL RV SENSING INTR AMPL: 14.5 mV
MDC IDC SET LEADCHNL RV SENSING SENSITIVITY: 0.3 mV

## 2018-09-19 NOTE — Progress Notes (Signed)
Patient ID:   Advanced Heart Failure Clinic Note   Patient ID: Wayne [name], male   DOB: April 08, 1941, 77 y.o.   MRN: [id number] PCP: Dr. Elease Hashimoto Cardiology: Dr. Earline Mayotte Wayne [name] is a 77 y.o. male  with history of DM, HTN, CAD, and ischemic cardiomyopathy who presents for followup of CHF and CAD. Patient had a CHF exacerbation in 2/12 and was found to have LV systolic dysfunction with EF around 20%. LHC showed RCA, ramus, and CFX disease. RCA was subtotally occluded. Cardiac MRI showed that all walls, including the inferior wall, should be viable. Patient therefore underwent opening of his chronic totally occluded RCA as well as PCI to the ramus in 2/12. He received drug eluting stents and was on Effient.  He had an echo in 2/13 that showed EF improved to 45% with moderate LV dilation and mild LV hypertrophy.   In 5/13, he developed a large left lower leg hematoma.  He was still on Effient at that time.  ASA and Effient were stopped.  The hematoma did not resolve.  In 7/13, he was re-admitted with septic shock from MSSA from an abscess in his left gastrocnemius.  He also grew Pseudomonas from the left gastrocnemius as well.  He had a prolonged course in the hospital and later in a rehab facility.  Ultimately, he got back home again.  I had him get an echo in 1/14, and this showed EF 15% with diffuse hypokinesis and inferoposterior akinesis.  He had been off of most of his prior cardiac medications.  Took him back for [facility] in 1/14.  This showed subtotal occlusion of a small AV LCx and 95% ostial in-stent restenosis in the RCA.  He was treated in 2/14 with a Xience DES to the ostial RCA and begun on Plavix.  Unfortunately, he developed diffuse hives after starting Plavix and had to be switched to ticlopidine.  He has tolerated ticlopidine.   Echo done in 12/14 showed some improvement in LV function but EF was still low at 30-35%.  He did not want ICD.    Presented to ED 11/14/15 with worsening SOB after a  few steps and CXR with CHF and bilateral effusions in the setting of marked medical non-compliance. States he stopped taking his medicines in 01/2015 (except for ASA 81 and a multivitamin).  He was feeling good and decided that he did not need them anymore.  Also c/o URI symptoms. Diuresed over 2 L with IV diuretics in the ER and started on lasix 20 mg daily.   Echo (12/16) showed EF 20-25% with diffuse hypokinesis (down from 35% in 12/15).  Echo 10/17 showed EF 15% with mildly decreased RV systolic function.    He had left and right heart catheterization in 12/17. This showed elevated right and left heart filling pressures and low cardiac output. The RCA was totally occluded with collaterals, there was 95% ostial to mid LAD stenosis.  Patient had PCI with DES to proximal-mid LAD.    He was noted to be in atrial fibrillation with RVR in 2/18 at cardiac rehab. He felt fatigued.  He was admitted and started on amiodarone for rate control. ASA was stopped, ticagrelor was decreased to 60 mg bid, and Xarelto 15 mg daily was started.  He converted spontaneously back to NSR.    He suffered a ventricular fibrillation arrest in 3/18.  Luckily, he was wearing a Lifevest and was shocked.  He was admitted and started on amiodarone.  Medtronic ICD was placed.  Echo 3/18 with EF 20-25%.   Admitted again 6/18 due to VF arrest. ICD lead had been undersensing leading to prolongation of time to discharge.  He underwent RV lead extraction with new lead implant.   He was admitted again with VT storm in 7/18.  No prior chest pain or symptoms of worsening CAD.  He had VT ablation this admission.   Admitted with ICD shock 07/05/17. He had coronary angiography this admission.  Had cutting balloon angioplasty of severe in-stent restenosis in the proximal LAD. Pt had additional VT 07/07/17, EP saw and recommended BB titration. Po amio up-titrated. Discharge weight 228 lbs  He had TURBT 11/18, urothelial cancer diagnosed.     Today he returns for 3 month HF follow up. Overall feeling fine. Denies SOB/PND/Orthopnea. No chest pain. No bleeding issues. Appetite ok. No fever or chills. Weight at home has been stable. Taking all medications. He has not restarted repatha due to cost.    Echo 06/2018 EF 25-30%, diffuse HK with inferior AK, mild AS, normal RV.    Medtronic device interrogated: Fluid index below threshold. Ctivity ~2 hours per day. No VT. No A fib.   Labs (12/16): K 4.9, creatinine 1.23 Labs (1/17): K 4.7, creatinine 1.15, BNP 1433 Labs (2/17): K 4.7, creatinine 1.14, BNP 870 Labs (3/17): K 4.5, creatinine 1.10, BNP 1257 Labs (5/17): K 4.2, creatinine 1.2, BNP 818 Labs (7/17): LDL 149, HDL 40, TGs 210 Labs (9/17): K 4.9, creatinine 1.36 Labs (10/17): K 3.7, creatinine 1.2, LDL 87, HDL 32, LFTs normal Labs (12/17): K 4.5, creatinine 1.34, digoxin 0.3 Labs (2/18): K 4.4, creatinine 1.48, hgb 11.8 Labs (3/18): K 5 => 4.6, creatinine 1.54 => 1.46, digoxin 1.3 => 0.3 Labs (5/18): TSH normal Labs (7/18): K 3.7, creatinine 1.33, hgb 11.4, digoxin 0.6, LFTs normal Labs (8/18): K 5.1, creatinine 1.47, hgb 13.3 Labs (10/18): LDL 50, HDL 40, TGs 247 Labs (11/18): K 5.9, creatinine 1.42, hgb 15.2 Labs (12/18): K 5, creatinine 1.27, digoxin 0.6, LFTs normal, TSH normal Labs (3/19): LDL 86, HDL 45 Labs (6/19): K 4.1, creatinine 1.34, LFTs normal  Past Medical History:  1. HYPERTENSION  2. HYPERLIPIDEMIA: Myalgias with atorvastatin and Crestor 3. ECZEMA  4. RHINITIS  5. HERPES ZOSTER OPHTHALMICUS  6. ADENOCARCINOMA, PROSTATE: Status post prostatectomy in 2009. Has had some incontinence since then.  7. Diabetes mellitus type II  8. Arthritis  9. Obesity  10. GERD: rare  11. CAD: Presented with exertional dyspnea, never had chest pain. LHC (2/12) with subtotalled proximal RCA and left to right collaterals, 90% proximal moderate-sized ramus, 95% proximal relatively small CFX, 40-50% proximal LAD. Cardiac MRI  (2/12) showed EF 21%, some mild scar in basal segments but all wall segments would be expected to be viable. DES x 5 (overlapping) to RCA, DES x 1 to RI 01/30/11 .  Bled into leg with Effient use (long, complicated course).  LHC (1/14): AV LCx small with subtotal occlusion, 95% ostial instent restenosis in RCA. PCI in 2/14 to ostial RCA with 3.5 x 15 Xience DES.  Plavix allergy (hives) so put on ticlopidine.  - LHC (12/17):  the RCA was totally occluded with collaterals, there was 95% ostial to mid LAD stenosis, patient had PCI with DES to proximal-mid LAD. - LHC (7/18): Totally occluded RCA with left to right collaterals, subtotal occlusion of small AV LCx, 90% ISR proximal LAD => cutting balloon angioplasty.  12. Ischemic CMP: Echo (2/12) with moderately dilated LV,  EF about 20% with diffuse hypokinesis and inferior akinesis, pseudonormal diastolic function, mild MR, severe LAE, mildly decreased RV systolic function. RHC (2/12) with mean RA 12, PA 40/25, mean PCWP 26, CI 2.1.  Echo (5/12) with EF 40% (appeared worse to my eye) with posterior HK, basal inferior AK, inferoseptal AK, basal anteroseptal AK, mild MR.  Cardiac MRI was repeated and showed EF 32% (improved from 21%) and mild LV dilation (was severely dilated before) with diffuse hypokinesis and subendocardial scar in the basal inferior, basal posterior, and basal anterolateral segments.  Echo (2/13) with EF 45%, moderate LV dilation, mild LVH.  Echo (1/14) with EF 15%, diffuse hypokinesis, inferoposterior akinesis, mild MR, grade I diastolic dysfunction.  Echo (5/14) with EF 30-35%, mild LV dilation, akinesis of the basal inferior wall otherwise diffuse hypokinesis.  Echo (12/14) with EF 30-35%, mild LV dilation, diffuse hypokinesis with basal inferior and posterior akinesis. Echo (12/15) with EF 35%, mildly dilated LV, wall motion abnormalities, normal RV size and systolic function. Echo (12/16) with EF 20-25%, diffuse hypokinesis, severe LV dilation,  mild MR.  - Echo (10/17): EF 15%, grade II diastolic dysfunction, mild MR, mildly decreased RV systolic function.  - Possible angioedema related to Entresto.  - Hyperkalemia with spironolactone 12.5 daily.  - CPX (10/17): peak VO2 10.6, VE/VCO2 slope 48.6, RER 1.12.  Severe HF limitation.  - RHC (12/17): mean RA 14, PA 53/27, mean PCWP 25, CI 1.93.  - Echo (3/18): EF 20-25%, moderate LV dilation, mild LVH, normal RV size and systolic function, mild aortic stenosis.  - Medtronic ICD 3/18.  - Echo (6/18): EF 25-30%, mild AS.  - Echo (7/19): Moderate LV dilation, EF 25-30%, diffuse hypokinesis with inferior AK, normal RV size and systolic function, mild AS.  13. Cervical OA.  14. Polymyalgia rheumatica 15. OSA: Severe on sleep study 10/12.  On CPAP.  16. Left lower leg hematoma in setting of Effient use.  He developed a left gastrocnemius abscess and septic shock with prolonged hospitalization beginning in 7/13.  17. PAD: Lower extremity arterial doppler evaluation in 2/17 showed occluded peroneal arteries bilaterally, ABI 1.1 (R) and 1.0 (L).   - ABIs normal in 1/19.  18. Carotid dopplers (10/17) with minimal disease.  19. Atrial fibrillation: Paroxysmal.  20. Ventricular fibrillation arrest: 3/18.  Medtronic ICD placed, now on amiodarone.  - VT storm 7/18, now s/p VT ablation.  21. Aortic stenosis: Mild on 6/18 echo and 7/19 echo.  22. Urothelial cancer: s/p TURBT of bladder tumor 11/18.   Family History:  Father died with MI at age 34. He was an alcoholic. Mother died with cancer at around 32.   Social History:  Occupation: retired Administrator  Married, lives in [place]  Past smoker, quit around [place] of systems complete and found to be negative unless listed in HPI.      Current Outpatient Medications  Medication Sig Dispense Refill  . ACCU-CHEK AVIVA PLUS test strip CHECK BLOOD SUGAR ONCE TO TWICE DAILY. 200 each 0  . amiodarone (PACERONE) 200 MG tablet Take one  tablet by mouth Monday through Saturday.  Do NOT take on Sunday. 90 tablet 2  . carvedilol (COREG) 12.5 MG tablet Take 1 tablet (12.5 mg total) by mouth 2 (two) times daily. 60 tablet 6  . diphenhydramine-acetaminophen (TYLENOL PM) 25-500 MG TABS tablet Take 1 tablet at bedtime as needed by mouth (for sleep).     . furosemide (LASIX) 20 MG tablet Take 1 tablet (20  mg total) by mouth 2 (two) times daily. 60 tablet 3  . insulin degludec (TRESIBA FLEXTOUCH) 100 UNIT/ML SOPN FlexTouch Pen Inject 10 Units into the skin daily at 10 pm.    . losartan (COZAAR) 25 MG tablet Take 1 tablet (25 mg total) by mouth 2 (two) times daily. 60 tablet 3  . Multiple Vitamins-Minerals (CENTRUM SILVER ADULT 50+) TABS Take 1 tablet by mouth daily.    . Omega-3 Fatty Acids (FISH OIL) 1000 MG CAPS Take 1,000 mg by mouth 2 (two) times daily.     . rivaroxaban (XARELTO) 20 MG TABS tablet Take 1 tablet (20 mg total) by mouth daily with supper. 90 tablet 3  . spironolactone (ALDACTONE) 25 MG tablet Take 1 tablet (25 mg total) by mouth every evening. 30 tablet 6  . rosuvastatin (CRESTOR) 10 MG tablet Take 1 tablet (10 mg total) by mouth daily. 30 tablet 11   No current facility-administered medications for this encounter.    Facility-Administered Medications Ordered in Other Encounters  Medication Dose Route Frequency Provider Last Rate Last Dose  . epirubicin (ELLENCE) 50 mg in sodium chloride 0.9 % bladder instillation  50 mg Bladder Instillation Once Festus Aloe, MD        Vitals:   09/20/18 1026  BP: 132/70  Pulse: 69  SpO2: 95%  Weight: 112.4 kg (247 lb 12.8 oz)   Wt Readings from Last 3 Encounters:  09/20/18 112.4 kg (247 lb 12.8 oz)  08/17/18 112.2 kg (247 lb 4.8 oz)  07/06/18 109.6 kg (241 lb 9.6 oz)   General:  Well appearing. No resp difficulty. Walked in the clinic with a cane.  HEENT: normal Neck: supple. no JVD. Carotids 2+ bilat; no bruits. No lymphadenopathy or thryomegaly appreciated. Cor:  PMI nondisplaced. Regular rate & rhythm. No rubs, gallops or murmurs. Lungs: clear Abdomen: soft, nontender, nondistended. No hepatosplenomegaly. No bruits or masses. Good bowel sounds. Extremities: no cyanosis, clubbing, rash, edema Neuro: alert & orientedx3, cranial nerves grossly intact. moves all 4 extremities w/o difficulty. Affect pleasant  Assessment/Plan: 1. Chronic systolic CHF: Ischemic cardiomyopathy.  6/18 echo with EF 25-30%, diffuse hypokinesis.  CPX in 10/17 with severe HF limitation.  RHC in 12/17 with elevated filling pressures and low cardiac output.  Medtronic ICD in 3/18 after vfib arrest. He had VT again in 6/18, then VT storm in 7/18 now s/p VT ablation.  - Echo 06/2018  EF 25-30% - NYHA class II symptoms - Volume status stable. Continue lasix 20 mg BID.  - Continue Coreg at 12.5 mg BID -Continue spiro to 25 mg daily.  - Continue losartan 25 mg bid. Not a candidate for entresto with history of angioedema.    - He is off digoxin and would like to remain off - Check BMET  2. CAD:  Now s/p cutting balloon angioplasty to in-stent restenosis in proximal LAD.  He has an occluded RCA but there were left to right collaterals.   -No chest pain.  - He was on  Repatha with good lipid control.  Unfortunately, he will not be able to get Repatha in the future as he does not have Medicare part D.   - Continue Crestor 10 mg daily. LDL 86 02/2018 - He is not on ASA given Xarelto use.  3. HLD: See discussion above regarding restarting Crestor above.  4. PAD: Normal ABIs in 1/19.   No change.  5. Diabetes type II: Continue empagliflozin. No change 6.PAF:  -No A fib.  - Continue  Xarelto 20 mg daily. - Continue amiodarone 200 mg daily. LFTs okay 05/2018.  He will need regular eye exams.  - Would consider atrial fibrillation ablation based on [facility]-HF data if AF recurs. 7. Ventricular fibrillation arrest: On amiodarone and has Medtronic ICD.  He had VT ablation in 7/18 after episode of VT  storm.  - No VT on ICD interrogation.  8. OSA:  - Not using CPAP.  9. CKD: Stage 3.  BMEt today.  10. Bladder Cancer - Underwent cystoscopy with biopsy on 05/25/18   Follow up in 3 months with Dr Aundra Dubin.   Darrick Grinder, NP  09/20/2018

## 2018-09-20 ENCOUNTER — Encounter: Payer: Medicare Other | Admitting: *Deleted

## 2018-09-20 ENCOUNTER — Ambulatory Visit (HOSPITAL_COMMUNITY)
Admission: RE | Admit: 2018-09-20 | Discharge: 2018-09-20 | Disposition: A | Discharge status: Home / Self Care | Payer: Medicare Other | Source: Ambulatory Visit | Attending: Internal Medicine | Admitting: Internal Medicine

## 2018-09-20 ENCOUNTER — Encounter (HOSPITAL_COMMUNITY): Payer: Self-pay

## 2018-09-20 ENCOUNTER — Telehealth: Payer: Self-pay | Admitting: Family Medicine

## 2018-09-20 VITALS — BP 132/70 | HR 69 | Wt 247.0 lb

## 2018-09-20 VITALS — BP 132/70 | HR 69 | Wt 247.8 lb

## 2018-09-20 DIAGNOSIS — Z955 Presence of coronary angioplasty implant and graft: Secondary | ICD-10-CM | POA: Insufficient documentation

## 2018-09-20 DIAGNOSIS — M1990 Unspecified osteoarthritis, unspecified site: Secondary | ICD-10-CM | POA: Diagnosis not present

## 2018-09-20 DIAGNOSIS — Z7901 Long term (current) use of anticoagulants: Secondary | ICD-10-CM | POA: Insufficient documentation

## 2018-09-20 DIAGNOSIS — Z87891 Personal history of nicotine dependence: Secondary | ICD-10-CM | POA: Insufficient documentation

## 2018-09-20 DIAGNOSIS — N183 Chronic kidney disease, stage 3 unspecified: Secondary | ICD-10-CM

## 2018-09-20 DIAGNOSIS — G4733 Obstructive sleep apnea (adult) (pediatric): Secondary | ICD-10-CM | POA: Diagnosis not present

## 2018-09-20 DIAGNOSIS — E785 Hyperlipidemia, unspecified: Secondary | ICD-10-CM | POA: Diagnosis not present

## 2018-09-20 DIAGNOSIS — K219 Gastro-esophageal reflux disease without esophagitis: Secondary | ICD-10-CM | POA: Diagnosis not present

## 2018-09-20 DIAGNOSIS — Z8249 Family history of ischemic heart disease and other diseases of the circulatory system: Secondary | ICD-10-CM | POA: Diagnosis not present

## 2018-09-20 DIAGNOSIS — I130 Hypertensive heart and chronic kidney disease with heart failure and stage 1 through stage 4 chronic kidney disease, or unspecified chronic kidney disease: Secondary | ICD-10-CM | POA: Diagnosis not present

## 2018-09-20 DIAGNOSIS — I2510 Atherosclerotic heart disease of native coronary artery without angina pectoris: Secondary | ICD-10-CM | POA: Diagnosis not present

## 2018-09-20 DIAGNOSIS — I4901 Ventricular fibrillation: Secondary | ICD-10-CM | POA: Insufficient documentation

## 2018-09-20 DIAGNOSIS — I5022 Chronic systolic (congestive) heart failure: Secondary | ICD-10-CM | POA: Diagnosis not present

## 2018-09-20 DIAGNOSIS — Z8674 Personal history of sudden cardiac arrest: Secondary | ICD-10-CM | POA: Insufficient documentation

## 2018-09-20 DIAGNOSIS — E669 Obesity, unspecified: Secondary | ICD-10-CM | POA: Insufficient documentation

## 2018-09-20 DIAGNOSIS — I472 Ventricular tachycardia: Secondary | ICD-10-CM | POA: Insufficient documentation

## 2018-09-20 DIAGNOSIS — M353 Polymyalgia rheumatica: Secondary | ICD-10-CM | POA: Diagnosis not present

## 2018-09-20 DIAGNOSIS — I255 Ischemic cardiomyopathy: Secondary | ICD-10-CM | POA: Diagnosis not present

## 2018-09-20 DIAGNOSIS — E1122 Type 2 diabetes mellitus with diabetic chronic kidney disease: Secondary | ICD-10-CM | POA: Insufficient documentation

## 2018-09-20 DIAGNOSIS — Z8546 Personal history of malignant neoplasm of prostate: Secondary | ICD-10-CM | POA: Diagnosis not present

## 2018-09-20 DIAGNOSIS — Z95810 Presence of automatic (implantable) cardiac defibrillator: Secondary | ICD-10-CM | POA: Insufficient documentation

## 2018-09-20 DIAGNOSIS — Z79899 Other long term (current) drug therapy: Secondary | ICD-10-CM | POA: Insufficient documentation

## 2018-09-20 DIAGNOSIS — I480 Paroxysmal atrial fibrillation: Secondary | ICD-10-CM | POA: Insufficient documentation

## 2018-09-20 DIAGNOSIS — Z006 Encounter for examination for normal comparison and control in clinical research program: Secondary | ICD-10-CM

## 2018-09-20 LAB — BASIC METABOLIC PANEL
ANION GAP: 6 (ref 5–15)
BUN: 20 mg/dL (ref 8–23)
CALCIUM: 9.2 mg/dL (ref 8.9–10.3)
CHLORIDE: 106 mmol/L (ref 98–111)
CO2: 23 mmol/L (ref 22–32)
CREATININE: 1.23 mg/dL (ref 0.61–1.24)
GFR calc Af Amer: >60 mL/min (ref >60)
GFR calc non Af Amer: 56 mL/min — ABNORMAL LOW (ref >60)
Glucose, Bld: 328 mg/dL — ABNORMAL HIGH (ref 70–99)
Potassium: 4.5 mmol/L (ref 3.5–5.1)
Sodium: 135 mmol/L (ref 135–145)

## 2018-09-20 NOTE — Research (Signed)
Saw pt in clinic today. Pt doing well. No changes in his meds per pt. No complaints of sob or chest pains.   [id number]  MOBILITY:    I HAVE NO PROBLEMS WALKING []   I HAVE SLIGHT PROBLEMS WALKING []   I HAVE MODERATE PROBLEMS WALKING [x]   I HAVE SEVERE PROBLEMS WALKING []   I AM UNABLE TO WALK  []     SELF-CARE:   I HAVE NO PROBLEMS WASING OR DRESSING MYSELF  [x]   I HAVE SLIGHT PROBLEMS WASHING OR DRESSING MYSELF  []   I HAVE MODERATE PROBLEMS WASHING OR DRESSING MYSELF []   I HAVE SEVERE PROBLEMS WASHING OR DRESSING MYSELF  []   I HAVE SEVERE PROBLEMS WASHING OR DRESSING MYSELF  []   I AM UNABLE TO WASH OR DRESS MYSELF []     USUAL ACTIVITIES: (E.G. WORK/STUDY/HOUSEWORK/FAMILY OR LEISURE ACTIVITIES.    I HAVE NO PROBLEMS DOING MY USUAL ACTIVITIES [x]   I HAVE SLIGHT PROBLEMS DOING MY USUAL ACTIVITIES []   I HAVE MODERATE PROBLEMS DOING MY USUAL ACTIVIITIES []   I HAVE SEVERE PROBLEMS DOING MY USUAL ACTIVITIES []   I AM UNABLE TO DO MY USUAL ACTIVITIES []     PAIN /DISCOMFORT   I HAVE NO PAIN OR DISCOMFORT [x]   I HAVE SLIGHT PAIN OR DISCOMFORT []   I HAVE MODERATE PAIN OR DISCOMFORT []   I HAVE SEVERE PAIN OR DISCOMFORT []   I HAVE EXTREME PAIN OR DISCOMFORT []     ANXIETY/DEPRESSION   I AM NOT ANXIOUS OR DEPRESSED [x]   I AM SLIGHTLY ANXIOUS OR DEPRESSED []   I AM MODERATELY ANXIOUS OR DREPRESSED []   I AM SEVERELY ANXIOUS OR DEPRESSED []   I AM EXTREMELY ANXIOUS OR DEPRESSED []     SCALE OF 0-100 HOW WOULD YOU RATE TODAY?  0 IS THE WORSE AND 100 IS THE BEST HEALTH YOU CAN IMAGINE: 80    Current Outpatient Medications:  .  ACCU-CHEK AVIVA PLUS test strip, CHECK BLOOD SUGAR ONCE TO TWICE DAILY., Disp: 200 each, Rfl: 0 .  amiodarone (PACERONE) 200 MG tablet, Take one tablet by mouth Monday through Saturday.  Do NOT take on Sunday., Disp: 90 tablet, Rfl: 2 .  carvedilol (COREG) 12.5 MG tablet, Take 1 tablet (12.5 mg total) by mouth 2 (two) times daily., Disp: 60 tablet, Rfl: 6 .   diphenhydramine-acetaminophen (TYLENOL PM) 25-500 MG TABS tablet, Take 1 tablet at bedtime as needed by mouth (for sleep). , Disp: , Rfl:  .  furosemide (LASIX) 20 MG tablet, Take 1 tablet (20 mg total) by mouth 2 (two) times daily., Disp: 60 tablet, Rfl: 3 .  insulin degludec (TRESIBA FLEXTOUCH) 100 UNIT/ML SOPN FlexTouch Pen, Inject 10 Units into the skin daily at 10 pm., Disp: , Rfl:  .  losartan (COZAAR) 25 MG tablet, Take 1 tablet (25 mg total) by mouth 2 (two) times daily., Disp: 60 tablet, Rfl: 3 .  Multiple Vitamins-Minerals (CENTRUM SILVER ADULT 50+) TABS, Take 1 tablet by mouth daily., Disp: , Rfl:  .  Omega-3 Fatty Acids (FISH OIL) 1000 MG CAPS, Take 1,000 mg by mouth 2 (two) times daily. , Disp: , Rfl:  .  rivaroxaban (XARELTO) 20 MG TABS tablet, Take 1 tablet (20 mg total) by mouth daily with supper., Disp: 90 tablet, Rfl: 3 .  rosuvastatin (CRESTOR) 10 MG tablet, Take 1 tablet (10 mg total) by mouth daily., Disp: 30 tablet, Rfl: 11 .  spironolactone (ALDACTONE) 25 MG tablet, Take 1 tablet (25 mg total) by mouth every evening., Disp: 30 tablet, Rfl: 6

## 2018-09-20 NOTE — Patient Instructions (Signed)
Labs today We will only contact you if something comes back abnormal or we need to make some changes. Otherwise no news is good news!    Your physician recommends that you schedule a follow-up appointment in: 3 months with Dr McLean   Do the following things EVERYDAY: 1) Weigh yourself in the morning before breakfast. Write it down and keep it in a log. 2) Take your medicines as prescribed 3) Eat low salt foods-Limit salt (sodium) to 2000 mg per day.  4) Stay as active as you can everyday 5) Limit all fluids for the day to less than 2 liters   

## 2018-09-20 NOTE — Telephone Encounter (Signed)
Copied from [place] [id number]. Topic: Quick Communication - See Telephone Encounter >> Sep 20, 2018 12:02 PM Conception Chancy, NT wrote: CRM for notification. See Telephone encounter for: 09/20/18.   Patient called requesting samples of insulin degludec (TRESIBA FLEXTOUCH) 100 UNIT/ML SOPN FlexTouch Pen. Patient states that he received some from Dr. Elease Hashimoto, and would like more samples.

## 2018-09-20 NOTE — Progress Notes (Signed)
CSW met with patient to discuss social determinants of health.  Patient reports no issues with housing, transportation, or food.  Patient reports that although he has been able to get medications it has become a strain as all of his medication costs are out of pocket- patient reports that he has not signed up for Medicare part D at this time and is not sure how to proceed with doing so.  CSW provided patient with number for [employer] and instructed him to call to get assistance signing up for Medicare part D.  CSW will continue to follow patient in clinic as needed  Jorge Ny, [place] Social Worker [phone]

## 2018-09-20 NOTE — Telephone Encounter (Signed)
Do not have samples of Tresiba 100mg /ml.  Left a message for a return call.

## 2018-09-21 NOTE — Telephone Encounter (Signed)
Pt called back.  Informed him that there are no [place] samples.

## 2018-10-12 ENCOUNTER — Other Ambulatory Visit: Payer: Self-pay | Admitting: Internal Medicine

## 2018-10-29 ENCOUNTER — Ambulatory Visit: Payer: Medicare Other | Admitting: Nurse Practitioner

## 2018-11-16 ENCOUNTER — Encounter: Payer: Self-pay | Admitting: Nurse Practitioner

## 2018-11-16 ENCOUNTER — Ambulatory Visit: Payer: Medicare Other | Admitting: Family Medicine

## 2018-11-16 ENCOUNTER — Ambulatory Visit (INDEPENDENT_AMBULATORY_CARE_PROVIDER_SITE_OTHER): Payer: Medicare Other | Admitting: Nurse Practitioner

## 2018-11-16 VITALS — BP 128/70 | HR 68 | Ht 68.0 in | Wt 235.4 lb

## 2018-11-16 DIAGNOSIS — I2510 Atherosclerotic heart disease of native coronary artery without angina pectoris: Secondary | ICD-10-CM

## 2018-11-16 DIAGNOSIS — Z1211 Encounter for screening for malignant neoplasm of colon: Secondary | ICD-10-CM | POA: Diagnosis not present

## 2018-11-16 NOTE — Patient Instructions (Addendum)
If you are age 75 or older, your body mass index should be between 23-30. Your Body mass index is 35.79 kg/m. If this is out of the aforementioned range listed, please consider follow up with your Primary Care Provider.  If you are age 58 or younger, your body mass index should be between 19-25. Your Body mass index is 35.79 kg/m. If this is out of the aformentioned range listed, please consider follow up with your Primary Care Provider.   Follow up as needed.  Thank you for choosing me and [facility].   Tye Savoy, NP

## 2018-11-16 NOTE — Progress Notes (Signed)
ASSESSMENT / PLAN:    60.  77 year old male here to discuss having a colonoscopy.  His last colonoscopy was 10 or more years ago with [facility].  Patient said he had colon polyps removed at the time.  Occasionally he has a scant amount of blood with BMs on Xarelto which he attributes to hemorrhoids.  Otherwise bowel movements are normal, no unexplained weight loss, no family history colon cancer and no anemia based on last labs in July.  -Long discussion with patient and his wife about colonoscopy.   It would be confirm hemorrhoidal bleeding but in his case I feel the risks of colonoscopy may outweigh the benefits. Patient is 77 years of age with multiple co-morbidities including an extensive cardiac history.  -Patient agrees with holding off on colonoscopy.  He was actually relieved with this plan.  He is not a good candidate for Cologuard or IFOBT given the occasional bleeding.  I briefly discussed a virtual colonoscopy, he is most comfortable just holding off with any work-up for now.   -Patient will call us if rectal bleeding becomes more frequent, if he develops bowel changes, unexplained weight loss, etc..  2. Uncontrolled DM, A1c in September was 10.3  HPI:     Chief Complaint:   Colonoscopy is due.   Patient is a 77 year old male known to Dr. Henrene Pastor.  He has an extensive PMH not limited to DM2, CKD, HTN, CAD s/p multiple interventions, PAF, systolic heart failure with EF of 25-30% on last echocardiogram in July of this year. 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   Patient is here with his wife to discuss having a colonoscopy.  Patient says his last colonoscopy was 10 or more years ago with [facility].  Apparently he was advised to have a 5-year follow-up but did not.  I do not have the results of that colonoscopy but apparently it was one of maybe three over the years.   Since being on Xarelto patient has had  occasional scant bleeding with bowel movements which she attributes to hemorrhoids.  He denies abdominal pain, bowel changes, unintentional weight loss.  He has no family history of colon cancer.  Data Reviewed:  Labs 07/06/2018: CBC unremarkable except for platelet count of 141 (chronically low).   Hemoglobin was 16 Liver test normal   Past Medical History:  Diagnosis Date  . AICD (automatic cardioverter/defibrillator) present   . Arthritis   . Cancer [facility])    bladder and prostate  . Cellulitis of left leg    a. 05/5680 complicated by septic shock  . Chronic systolic CHF (congestive heart failure) ([place])   . CKD (chronic kidney disease), stage III ([place])    pt denies  . Colon polyps   . CORONARY ATHEROSCLEROSIS NATIVE CORONARY ARTERY cardiologist-  dr Aundra Dubin   a. 01/2011 Cath/PCI: LM nl, LAD 40-50p, D1 80-small, LCX 95-small, RI 90, RCA 100, EF 20%;  b. 01/2011 Card MRI - No transmural scar;  c. 01/2011 PCI RCA->5 Promus DES, RI->3.0x16 Promus DES; d. Cath 01/13/13 patent LAD & Ramus, diffuse LCx dz, RCA mult overlapping stents w/ 95% osital stenosis, EF 20%, s/p DES to ostial/prox RCA 01/24/13   . Diabetes mellitus, type 2 ([place])    type 2  . GERD (gastroesophageal reflux disease)   . Hematoma of leg  a. left leg hematoma 03/2012 in the setting of asa/effient  . Herpes zoster ophthalmicus   . History of non-ST elevation myocardial infarction (NSTEMI) 06/2012  . History of prostate cancer    dx 2009---  s/p radioactive seed implants 11-24-2008  . HYPERLIPIDEMIA    intolerant to Lipitor (myalgias)  . HYPERTENSION   . Ischemic cardiomyopathy    a. 01/2012 Echo EF 45%, mild LVH; b. WY63%, grade 1 diastolic dysfunction, diffuse hypokinesis, inferoposterior akinesis   . Myocardial infarction ([place])    x2  . Noncompliance   . Obesity   . OSA (obstructive sleep apnea)    dx 2012  severe osa  no cpap just uses nose strips  . PAF (paroxysmal atrial fibrillation) ([place])    first dx 01-16-2017    . Polymyalgia rheumatica ([place])   . S/P drug eluting coronary stent placement    total 6 DES     Past Surgical History:  Procedure Laterality Date  . CARDIAC CATHETERIZATION  01/24/2013  . CARDIAC CATHETERIZATION N/A 11/14/2016   Procedure: Right/Left Heart Cath and Coronary Angiography;  Surgeon: Larey Dresser, MD;  Location: [place] [facility];  Service: Cardiovascular;  Laterality: N/A;  . CARDIAC CATHETERIZATION N/A 11/17/2016   Procedure: Coronary Stent Intervention w/Impella;  Surgeon: Peter M Martinique, MD;  Location: [place] [facility];  Service: Cardiovascular;  Laterality: N/A;  . CARDIAC CATHETERIZATION N/A 11/17/2016   Procedure: Coronary Atherectomy;  Surgeon: Peter M Martinique, MD;  Location: [place] [facility];  Service: Cardiovascular;  Laterality: N/A;  . CORONARY ANGIOPLASTY WITH STENT PLACEMENT  01-30-2011   dr Aundra Dubin   PTCA and multiple overlapping DEStenting to RCA and 1 DES to RI   . CORONARY BALLOON ANGIOPLASTY N/A 07/06/2017   Procedure: Coronary Balloon Angioplasty;  Surgeon: Sherren Mocha, MD;  Location: [place] [facility];  Service: Cardiovascular;  Laterality: N/A;  . CYSTOSCOPY WITH FULGERATION N/A 05/25/2018   Procedure: CYSTOSCOPY WITH FULGERATION/BLADDER BIOPSY , 15 mm lesion;  Surgeon: Festus Aloe, MD;  Location: [facility];  Service: Urology;  Laterality: N/A;  . I&D EXTREMITY  06/15/2012   Procedure: IRRIGATION AND DEBRIDEMENT EXTREMITY;  Surgeon: Newt Minion, MD;  Location: [place];  Service: Orthopedics;  Laterality: Left;  I&D Left Posterior Knee  . I&D EXTREMITY  06/30/2012   Procedure: IRRIGATION AND DEBRIDEMENT EXTREMITY;  Surgeon: Newt Minion, MD;  Location: [place];  Service: Orthopedics;  Laterality: Left;  Left Leg Irrigation and Debridement and placement of Wound VAC and application of  A-cell  . I&D EXTREMITY  07/20/2012   Procedure: IRRIGATION AND DEBRIDEMENT EXTREMITY;  Surgeon: Newt Minion, MD;  Location: [place];  Service: Orthopedics;  Laterality:  Left;  Irrigation and Debridement Left Leg and Place antibiotic beads   . ICD IMPLANT N/A 02/12/2017   Procedure: ICD Implant;  Surgeon: Evans Lance, MD;  Location: [place] [facility];  Service: Cardiovascular;  Laterality: N/A;  . LAPAROSCOPIC INGUINAL HERNIA REPAIR Right 09-11-2008   dr Barry Dienes  [facility]  . LEAD REVISION/REPAIR N/A 05/26/2017   Procedure: Lead Revision/Repair;  Surgeon: Evans Lance, MD;  Location: [place] [facility];  Service: Cardiovascular;  Laterality: N/A;  . LEFT HEART CATH AND CORONARY ANGIOGRAPHY N/A 07/06/2017   Procedure: Left Heart Cath and Coronary Angiography;  Surgeon: Larey Dresser, MD;  Location: [place] [facility];  Service: Cardiovascular;  Laterality: N/A;  . PERCUTANEOUS CORONARY STENT INTERVENTION (PCI-S) N/A 01/24/2013   Procedure: PERCUTANEOUS CORONARY STENT  INTERVENTION (PCI-S);  Surgeon: Wellington Hampshire, MD;  Location: [facility];  Service: Cardiovascular;  Laterality: N/A;  . RADIOACTIVE PROSTATE SEED IMPLANTS  11-24-2008   dr Tresa Endo  [facility]  . TRANSURETHRAL RESECTION OF BLADDER TUMOR N/A 10/30/2017   Procedure: TRANSURETHRAL RESECTION OF BLADDER TUMOR (TURBT)/ INSTILLATION OF EPIRUBICIN;  Surgeon: Festus Aloe, MD;  [facility];  Service: Urology;  Laterality: N/A;  . ULTRASOUND GUIDANCE FOR VASCULAR ACCESS  11/14/2016   Procedure: Ultrasound Guidance For Vascular Access;  Surgeon: Larey Dresser, MD;  Location: [place] [facility];  Service: Cardiovascular;;  . Stephanie Coup ABLATION N/A 06/19/2017   Procedure: Stephanie Coup Ablation;  Surgeon: Evans Lance, MD;  Location: [place] [facility];  Service: Cardiovascular;  Laterality: N/A;   Family History  Problem Relation Age of Onset  . Cancer Mother 52       unknown CA  . Heart disease Father   . Alcohol abuse Father   . Heart attack Father 71  . Heart attack Brother    Social History   Tobacco Use  . Smoking status: Former Smoker    Packs/day: 0.30    Years: 20.00    Pack years: 6.00      Types: Cigarettes    Last attempt to quit: 02/23/1989    Years since quitting: 29.7  . Smokeless tobacco: Never Used  Substance Use Topics  . Alcohol use: No  . Drug use: No   Current Outpatient Medications  Medication Sig Dispense Refill  . ACCU-CHEK AVIVA PLUS test strip CHECK BLOOD SUGAR ONCE TO TWICE DAILY. 200 each 0  . amiodarone (PACERONE) 200 MG tablet Take one tablet by mouth Monday through Saturday.  Do NOT take on Sunday. 90 tablet 2  . carvedilol (COREG) 12.5 MG tablet Take 1 tablet (12.5 mg total) by mouth 2 (two) times daily. 60 tablet 6  . diphenhydramine-acetaminophen (TYLENOL PM) 25-500 MG TABS tablet Take 1 tablet at bedtime as needed by mouth (for sleep).     . furosemide (LASIX) 20 MG tablet Take 1 tablet (20 mg total) by mouth 2 (two) times daily. 60 tablet 3  . Multiple Vitamins-Minerals (CENTRUM SILVER ADULT 50+) TABS Take 1 tablet by mouth daily.    . Omega-3 Fatty Acids (FISH OIL) 1000 MG CAPS Take 1,000 mg by mouth 2 (two) times daily.     . rivaroxaban (XARELTO) 20 MG TABS tablet Take 1 tablet (20 mg total) by mouth daily with supper. 90 tablet 3  . spironolactone (ALDACTONE) 25 MG tablet Take 1 tablet (25 mg total) by mouth every evening. 30 tablet 6  . losartan (COZAAR) 25 MG tablet Take 1 tablet (25 mg total) by mouth 2 (two) times daily. 60 tablet 3  . rosuvastatin (CRESTOR) 10 MG tablet Take 1 tablet (10 mg total) by mouth daily. 30 tablet 11   No current facility-administered medications for this visit.    Allergies  Allergen Reactions  . Entresto [Sacubitril-Valsartan] Swelling and Other (See Comments)    Angioedema  . Crestor [Rosuvastatin Calcium] Other (See Comments)    Myalgia, Interfering with Gait in high doses  . Lipitor [Atorvastatin] Other (See Comments)    Makes legs sore  . Plavix [Clopidogrel] Itching, Swelling and Other (See Comments)    Nose bleeds and welts on legs & back  . Ancef [Cefazolin] Itching, Rash and Other (See Comments)     Describes itching and rash, but said that "it wasn't that bad"  Review of Systems: No recent chest pain, shortness of breath, significant fatigue.   Creatinine clearance cannot be calculated (Patient's most recent lab result is older than the maximum 21 days allowed.)   Physical Exam:    Wt Readings from Last 3 Encounters:  11/16/18 235 lb 6.4 oz (106.8 kg)  09/20/18 247 lb 12.8 oz (112.4 kg)  09/20/18 247 lb (112 kg)    BP 128/70   Pulse 68   Ht 5\' 8"  (1.727 m)   Wt 235 lb 6.4 oz (106.8 kg)   BMI 35.79 kg/m  Constitutional:  Pleasant male in no acute distress. Psychiatric: Normal mood and affect. Behavior is normal. EENT: Pupils normal.  Conjunctivae are normal. No scleral icterus. Neck supple.  Cardiovascular: Normal rate, regular rhythm. No edema Pulmonary/chest: Effort normal and breath sounds normal. No wheezing, rales or rhonchi. Abdominal: Soft, protuberant, nontender. Bowel sounds active throughout. There are no masses palpable.  Neurological: Alert and oriented to person place and time. Skin: Skin is warm and dry. No rashes noted.  Tye Savoy, NP  11/16/2018, 2:53 PM  Cc: [name], MD

## 2018-11-16 NOTE — Progress Notes (Signed)
Wayne [name], I agree with holding off on colonoscopy unless absolutely necessary given his age and comorbidities.  He does have  colorectal neoplasia protection from previous colonoscopy.

## 2018-12-01 ENCOUNTER — Ambulatory Visit (INDEPENDENT_AMBULATORY_CARE_PROVIDER_SITE_OTHER): Payer: Medicare Other | Admitting: Internal Medicine

## 2018-12-01 ENCOUNTER — Encounter: Payer: Self-pay | Admitting: Internal Medicine

## 2018-12-01 ENCOUNTER — Other Ambulatory Visit (HOSPITAL_COMMUNITY): Payer: Self-pay | Admitting: *Deleted

## 2018-12-01 VITALS — BP 132/78 | HR 80 | Ht 68.0 in | Wt 240.0 lb

## 2018-12-01 DIAGNOSIS — I472 Ventricular tachycardia, unspecified: Secondary | ICD-10-CM

## 2018-12-01 DIAGNOSIS — I5022 Chronic systolic (congestive) heart failure: Secondary | ICD-10-CM | POA: Diagnosis not present

## 2018-12-01 DIAGNOSIS — Z95810 Presence of automatic (implantable) cardiac defibrillator: Secondary | ICD-10-CM | POA: Diagnosis not present

## 2018-12-01 DIAGNOSIS — I2510 Atherosclerotic heart disease of native coronary artery without angina pectoris: Secondary | ICD-10-CM

## 2018-12-01 MED ORDER — LOSARTAN POTASSIUM 25 MG PO TABS: 25.0000 mg | ORAL_TABLET | Freq: Two times a day (BID) | ORAL | 3 refills | Status: DC

## 2018-12-01 NOTE — Progress Notes (Signed)
HPI [name] returns today for follow-up.  He is a very pleasant 77 year old man with an extensive past medical history including chronic systolic heart failure and an ischemic cardiomyopathy.  The patient has a history of ventricular fibrillation cardiac arrest with prolonged downtime as his ICD under sensed his ventricular fibrillation.  The patient has a history of chronic systolic heart failure but his symptoms are currently improved.  Since beginning amiodarone, he has developed a slight tremor.  He denies chest pain.  He has class II heart failure symptoms. He has had no recent ICD therapies. Allergies  Allergen Reactions  . Entresto [Sacubitril-Valsartan] Swelling and Other (See Comments)    Angioedema  . Crestor [Rosuvastatin Calcium] Other (See Comments)    Myalgia, Interfering with Gait in high doses  . Lipitor [Atorvastatin] Other (See Comments)    Makes legs sore  . Plavix [Clopidogrel] Itching, Swelling and Other (See Comments)    Nose bleeds and welts on legs & back  . Ancef [Cefazolin] Itching, Rash and Other (See Comments)    Describes itching and rash, but said that "it wasn't that bad"     Current Outpatient Medications  Medication Sig Dispense Refill  . ACCU-CHEK AVIVA PLUS test strip CHECK BLOOD SUGAR ONCE TO TWICE DAILY. 200 each 0  . amiodarone (PACERONE) 200 MG tablet Take one tablet by mouth Monday through Saturday.  Do NOT take on Sunday. 90 tablet 2  . carvedilol (COREG) 12.5 MG tablet Take 1 tablet (12.5 mg total) by mouth 2 (two) times daily. 60 tablet 6  . diphenhydramine-acetaminophen (TYLENOL PM) 25-500 MG TABS tablet Take 1 tablet at bedtime as needed by mouth (for sleep).     . furosemide (LASIX) 20 MG tablet Take 1 tablet (20 mg total) by mouth 2 (two) times daily. 60 tablet 3  . Multiple Vitamins-Minerals (CENTRUM SILVER ADULT 50+) TABS Take 1 tablet by mouth daily.    . Omega-3 Fatty Acids (FISH OIL) 1000 MG CAPS Take 1,000 mg by mouth 2 (two)  times daily.     . rivaroxaban (XARELTO) 20 MG TABS tablet Take 1 tablet (20 mg total) by mouth daily with supper. 90 tablet 3  . rosuvastatin (CRESTOR) 10 MG tablet Take 1 tablet (10 mg total) by mouth daily. 30 tablet 11  . spironolactone (ALDACTONE) 25 MG tablet Take 1 tablet (25 mg total) by mouth every evening. 30 tablet 6   No current facility-administered medications for this visit.      Past Medical History:  Diagnosis Date  . AICD (automatic cardioverter/defibrillator) present   . Arthritis   . Cancer [facility])    bladder and prostate  . Cellulitis of left leg    a. 08/5092 complicated by septic shock  . Chronic systolic CHF (congestive heart failure) ([place])   . CKD (chronic kidney disease), stage III ([place])    pt denies  . Colon polyps   . CORONARY ATHEROSCLEROSIS NATIVE CORONARY ARTERY cardiologist-  dr Aundra Dubin   a. 01/2011 Cath/PCI: LM nl, LAD 40-50p, D1 80-small, LCX 95-small, RI 90, RCA 100, EF 20%;  b. 01/2011 Card MRI - No transmural scar;  c. 01/2011 PCI RCA->5 Promus DES, RI->3.0x16 Promus DES; d. Cath 01/13/13 patent LAD & Ramus, diffuse LCx dz, RCA mult overlapping stents w/ 95% osital stenosis, EF 20%, s/p DES to ostial/prox RCA 01/24/13   . Diabetes mellitus, type 2 ([place])    type 2  . GERD (gastroesophageal reflux disease)   . Hematoma  of leg    a. left leg hematoma 03/2012 in the setting of asa/effient  . Herpes zoster ophthalmicus   . History of non-ST elevation myocardial infarction (NSTEMI) 06/2012  . History of prostate cancer    dx 2009---  s/p radioactive seed implants 11-24-2008  . HYPERLIPIDEMIA    intolerant to Lipitor (myalgias)  . HYPERTENSION   . Ischemic cardiomyopathy    a. 01/2012 Echo EF 45%, mild LVH; b. ZO10%, grade 1 diastolic dysfunction, diffuse hypokinesis, inferoposterior akinesis   . Myocardial infarction ([place])    x2  . Noncompliance   . Obesity   . OSA (obstructive sleep apnea)    dx 2012  severe osa  no cpap just uses nose strips  . PAF  (paroxysmal atrial fibrillation) ([place])    first dx 01-16-2017  . Polymyalgia rheumatica ([place])   . S/P drug eluting coronary stent placement    total 6 DES    ROS:   All systems reviewed and negative except as noted in the HPI.   Past Surgical History:  Procedure Laterality Date  . CARDIAC CATHETERIZATION  01/24/2013  . CARDIAC CATHETERIZATION N/A 11/14/2016   Procedure: Right/Left Heart Cath and Coronary Angiography;  Surgeon: Larey Dresser, MD;  Location: [place] [facility];  Service: Cardiovascular;  Laterality: N/A;  . CARDIAC CATHETERIZATION N/A 11/17/2016   Procedure: Coronary Stent Intervention w/Impella;  Surgeon: Peter M Martinique, MD;  Location: [place] [facility];  Service: Cardiovascular;  Laterality: N/A;  . CARDIAC CATHETERIZATION N/A 11/17/2016   Procedure: Coronary Atherectomy;  Surgeon: Peter M Martinique, MD;  Location: [place] [facility];  Service: Cardiovascular;  Laterality: N/A;  . CORONARY ANGIOPLASTY WITH STENT PLACEMENT  01-30-2011   dr Aundra Dubin   PTCA and multiple overlapping DEStenting to RCA and 1 DES to RI   . CORONARY BALLOON ANGIOPLASTY N/A 07/06/2017   Procedure: Coronary Balloon Angioplasty;  Surgeon: Sherren Mocha, MD;  Location: [place] [facility];  Service: Cardiovascular;  Laterality: N/A;  . CYSTOSCOPY WITH FULGERATION N/A 05/25/2018   Procedure: CYSTOSCOPY WITH FULGERATION/BLADDER BIOPSY , 15 mm lesion;  Surgeon: Festus Aloe, MD;  Location: [facility];  Service: Urology;  Laterality: N/A;  . I&D EXTREMITY  06/15/2012   Procedure: IRRIGATION AND DEBRIDEMENT EXTREMITY;  Surgeon: Newt Minion, MD;  Location: [place];  Service: Orthopedics;  Laterality: Left;  I&D Left Posterior Knee  . I&D EXTREMITY  06/30/2012   Procedure: IRRIGATION AND DEBRIDEMENT EXTREMITY;  Surgeon: Newt Minion, MD;  Location: [place];  Service: Orthopedics;  Laterality: Left;  Left Leg Irrigation and Debridement and placement of Wound VAC and application of  A-cell  . I&D EXTREMITY   07/20/2012   Procedure: IRRIGATION AND DEBRIDEMENT EXTREMITY;  Surgeon: Newt Minion, MD;  Location: [place];  Service: Orthopedics;  Laterality: Left;  Irrigation and Debridement Left Leg and Place antibiotic beads   . ICD IMPLANT N/A 02/12/2017   Procedure: ICD Implant;  Surgeon: Evans Lance, MD;  Location: [place] [facility];  Service: Cardiovascular;  Laterality: N/A;  . LAPAROSCOPIC INGUINAL HERNIA REPAIR Right 09-11-2008   dr Barry Dienes  [facility]  . LEAD REVISION/REPAIR N/A 05/26/2017   Procedure: Lead Revision/Repair;  Surgeon: Evans Lance, MD;  Location: [place] [facility];  Service: Cardiovascular;  Laterality: N/A;  . LEFT HEART CATH AND CORONARY ANGIOGRAPHY N/A 07/06/2017   Procedure: Left Heart Cath and Coronary Angiography;  Surgeon: Larey Dresser, MD;  Location: [place] [facility];  Service:  Cardiovascular;  Laterality: N/A;  . PERCUTANEOUS CORONARY STENT INTERVENTION (PCI-S) N/A 01/24/2013   Procedure: PERCUTANEOUS CORONARY STENT INTERVENTION (PCI-S);  Surgeon: Wellington Hampshire, MD;  Location: [facility];  Service: Cardiovascular;  Laterality: N/A;  . RADIOACTIVE PROSTATE SEED IMPLANTS  11-24-2008   dr Tresa Endo  [facility]  . TRANSURETHRAL RESECTION OF BLADDER TUMOR N/A 10/30/2017   Procedure: TRANSURETHRAL RESECTION OF BLADDER TUMOR (TURBT)/ INSTILLATION OF EPIRUBICIN;  Surgeon: Festus Aloe, MD;  Location: [facility];  Service: Urology;  Laterality: N/A;  . ULTRASOUND GUIDANCE FOR VASCULAR ACCESS  11/14/2016   Procedure: Ultrasound Guidance For Vascular Access;  Surgeon: Larey Dresser, MD;  Location: [place] [facility];  Service: Cardiovascular;;  . Stephanie Coup ABLATION N/A 06/19/2017   Procedure: Stephanie Coup Ablation;  Surgeon: Evans Lance, MD;  Location: [place] [facility];  Service: Cardiovascular;  Laterality: N/A;     Family History  Problem Relation Age of Onset  . Cancer Mother 84       unknown CA  . Heart disease Father   . Alcohol abuse Father   . Heart attack Father 90  .  Heart attack Brother      Social History   Socioeconomic History  . Marital status: Married    Spouse name: Not on file  . Number of children: 1  . Years of education: Not on file  . Highest education level: Not on file  Occupational History  . Occupation: RETIRED    Employer: RETIRED    Comment: [employer]  . Financial resource strain: Not hard at all  . Food insecurity:    Worry: Never true    Inability: Never true  . Transportation needs:    Medical: No    Non-medical: No  Tobacco Use  . Smoking status: Former Smoker    Packs/day: 0.30    Years: 20.00    Pack years: 6.00    Types: Cigarettes    Last attempt to quit: 02/23/1989    Years since quitting: 29.7  . Smokeless tobacco: Never Used  Substance and Sexual Activity  . Alcohol use: No  . Drug use: No  . Sexual activity: Yes  Lifestyle  . Physical activity:    Days per week: Not on file    Minutes per session: Not on file  . Stress: Not on file  Relationships  . Social connections:    Talks on phone: Not on file    Gets together: Not on file    Attends religious service: Not on file    Active member of club or organization: Not on file    Attends meetings of clubs or organizations: Not on file    Relationship status: Not on file  . Intimate partner violence:    Fear of current or ex partner: Not on file    Emotionally abused: Not on file    Physically abused: Not on file    Forced sexual activity: Not on file  Other Topics Concern  . Not on file  Social History Narrative   Retired Administrator     BP 329/92   Pulse 80   Ht 5\' 8"  (1.727 m)   Wt 240 lb (108.9 kg)   SpO2 98%   BMI 36.49 kg/m   Physical Exam:  Well appearing NAD HEENT: Unremarkable Neck:  No JVD, no thyromegally Lymphatics:  No adenopathy Back:  No CVA tenderness Lungs:  Clear with no wheezes HEART:  Regular rate  rhythm, no murmurs, no rubs, no clicks Abd:  soft, positive bowel sounds, no organomegally, no  rebound, no guarding Ext:  2 plus pulses, no edema, no cyanosis, no clubbing Skin:  No rashes no nodules Neuro:  CN II through XII intact, motor grossly intact  EKG - none  DEVICE  Normal device function.  See PaceArt for details.   Assess/Plan: 1. VT - he has had no recurrent ventricular arrhythmias. He will continue low dose amiodarone. 2. ICD - his medtronic single chamber device is working normally. R waves are 10 today. No evidence of undersensing. 3. ICM - he remains active and denies anginal symptoms.  4. Chronic systolic heart failure - his fluid index looks good. He is encouraged to take his meds and maintain a low sodium diet.  Wayne [name].D.

## 2018-12-01 NOTE — Patient Instructions (Signed)
Medication Instructions:  Your physician recommends that you continue on your current medications as directed. Please refer to the Current Medication list given to you today.  Labwork: None ordered.  Testing/Procedures: None ordered.  Follow-Up: Your physician wants you to follow-up in: one year with Dr. Lovena Le.   You will receive a reminder letter in the mail two months in advance. If you don't receive a letter, please call our office to schedule the follow-up appointment.  Remote monitoring is used to monitor your ICD from home. This monitoring reduces the number of office visits required to check your device to one time per year. It allows [place] to keep an eye on the functioning of your device to ensure it is working properly. You are scheduled for a device check from home on 12/13/2018. You may send your transmission at any time that day. If you have a wireless device, the transmission will be sent automatically. After your physician reviews your transmission, you will receive a postcard with your next transmission date.  Any Other Special Instructions Will Be Listed Below (If Applicable).  If you need a refill on your cardiac medications before your next appointment, please call your pharmacy.

## 2018-12-03 LAB — CUP PACEART INCLINIC DEVICE CHECK
Date Time Interrogation Session: 20191220113139
Implantable Lead Implant Date: 20180612
MDC IDC LEAD LOCATION: 753860
MDC IDC PG IMPLANT DT: 20180103

## 2018-12-07 DIAGNOSIS — C672 Malignant neoplasm of lateral wall of bladder: Secondary | ICD-10-CM | POA: Diagnosis not present

## 2018-12-07 DIAGNOSIS — N5201 Erectile dysfunction due to arterial insufficiency: Secondary | ICD-10-CM | POA: Diagnosis not present

## 2018-12-13 ENCOUNTER — Telehealth: Payer: Self-pay

## 2018-12-13 ENCOUNTER — Ambulatory Visit (INDEPENDENT_AMBULATORY_CARE_PROVIDER_SITE_OTHER): Payer: Medicare Other

## 2018-12-13 DIAGNOSIS — I255 Ischemic cardiomyopathy: Secondary | ICD-10-CM

## 2018-12-13 NOTE — Telephone Encounter (Signed)
Left message for patient to remind of missed remote transmission.  

## 2018-12-14 LAB — CUP PACEART REMOTE DEVICE CHECK
Date Time Interrogation Session: 20191230224038
HighPow Impedance: 79 Ohm
Implantable Lead Implant Date: 20180612
Implantable Lead Location: 753860
Implantable Lead Model: 6935
Implantable Pulse Generator Implant Date: 20180103
Lead Channel Pacing Threshold Amplitude: 0.875 V
Lead Channel Pacing Threshold Pulse Width: 0.4 ms
Lead Channel Sensing Intrinsic Amplitude: 12.75 mV
Lead Channel Sensing Intrinsic Amplitude: 12.75 mV
MDC IDC MSMT BATTERY REMAINING LONGEVITY: 101 mo
MDC IDC MSMT BATTERY VOLTAGE: 3 V
MDC IDC MSMT LEADCHNL RV IMPEDANCE VALUE: 399 Ohm
MDC IDC MSMT LEADCHNL RV IMPEDANCE VALUE: 456 Ohm
MDC IDC SET LEADCHNL RV PACING AMPLITUDE: 2.5 V
MDC IDC SET LEADCHNL RV PACING PULSEWIDTH: 0.4 ms
MDC IDC SET LEADCHNL RV SENSING SENSITIVITY: 0.3 mV
MDC IDC STAT BRADY RV PERCENT PACED: 0.01 %

## 2018-12-14 NOTE — Telephone Encounter (Signed)
error 

## 2018-12-14 NOTE — Progress Notes (Signed)
Remote ICD transmission.   

## 2019-02-10 ENCOUNTER — Other Ambulatory Visit (HOSPITAL_COMMUNITY): Payer: Self-pay

## 2019-02-10 DIAGNOSIS — I5022 Chronic systolic (congestive) heart failure: Secondary | ICD-10-CM

## 2019-02-10 MED ORDER — CARVEDILOL 12.5 MG PO TABS: 12.5000 mg | ORAL_TABLET | Freq: Two times a day (BID) | ORAL | 0 refills | Status: DC

## 2019-02-12 ENCOUNTER — Encounter (HOSPITAL_COMMUNITY): Payer: Self-pay

## 2019-02-12 ENCOUNTER — Emergency Department (HOSPITAL_COMMUNITY)
Admission: EM | Admit: 2019-02-12 | Discharge: 2019-02-12 | Disposition: A | Discharge status: Home / Self Care | Payer: Medicare Other | Attending: Emergency Medicine | Admitting: Emergency Medicine

## 2019-02-12 ENCOUNTER — Other Ambulatory Visit: Payer: Self-pay

## 2019-02-12 ENCOUNTER — Emergency Department (HOSPITAL_COMMUNITY): Payer: Medicare Other

## 2019-02-12 DIAGNOSIS — Z79899 Other long term (current) drug therapy: Secondary | ICD-10-CM | POA: Diagnosis not present

## 2019-02-12 DIAGNOSIS — R509 Fever, unspecified: Secondary | ICD-10-CM | POA: Diagnosis not present

## 2019-02-12 DIAGNOSIS — Z87891 Personal history of nicotine dependence: Secondary | ICD-10-CM | POA: Insufficient documentation

## 2019-02-12 DIAGNOSIS — R1031 Right lower quadrant pain: Secondary | ICD-10-CM | POA: Diagnosis not present

## 2019-02-12 DIAGNOSIS — E1122 Type 2 diabetes mellitus with diabetic chronic kidney disease: Secondary | ICD-10-CM | POA: Insufficient documentation

## 2019-02-12 DIAGNOSIS — Z7901 Long term (current) use of anticoagulants: Secondary | ICD-10-CM | POA: Insufficient documentation

## 2019-02-12 DIAGNOSIS — I2510 Atherosclerotic heart disease of native coronary artery without angina pectoris: Secondary | ICD-10-CM | POA: Diagnosis not present

## 2019-02-12 DIAGNOSIS — R319 Hematuria, unspecified: Secondary | ICD-10-CM | POA: Diagnosis not present

## 2019-02-12 DIAGNOSIS — E1165 Type 2 diabetes mellitus with hyperglycemia: Secondary | ICD-10-CM | POA: Diagnosis not present

## 2019-02-12 DIAGNOSIS — N183 Chronic kidney disease, stage 3 (moderate): Secondary | ICD-10-CM | POA: Insufficient documentation

## 2019-02-12 DIAGNOSIS — Z955 Presence of coronary angioplasty implant and graft: Secondary | ICD-10-CM | POA: Insufficient documentation

## 2019-02-12 DIAGNOSIS — K8020 Calculus of gallbladder without cholecystitis without obstruction: Secondary | ICD-10-CM | POA: Diagnosis not present

## 2019-02-12 DIAGNOSIS — R310 Gross hematuria: Secondary | ICD-10-CM

## 2019-02-12 DIAGNOSIS — I5022 Chronic systolic (congestive) heart failure: Secondary | ICD-10-CM | POA: Insufficient documentation

## 2019-02-12 DIAGNOSIS — I130 Hypertensive heart and chronic kidney disease with heart failure and stage 1 through stage 4 chronic kidney disease, or unspecified chronic kidney disease: Secondary | ICD-10-CM | POA: Diagnosis not present

## 2019-02-12 DIAGNOSIS — R339 Retention of urine, unspecified: Secondary | ICD-10-CM | POA: Insufficient documentation

## 2019-02-12 DIAGNOSIS — R61 Generalized hyperhidrosis: Secondary | ICD-10-CM | POA: Diagnosis not present

## 2019-02-12 DIAGNOSIS — R52 Pain, unspecified: Secondary | ICD-10-CM | POA: Diagnosis not present

## 2019-02-12 LAB — CBC WITH DIFFERENTIAL/PLATELET
ABS IMMATURE GRANULOCYTES: 0.03 10*3/uL (ref 0.00–0.07)
Basophils Absolute: 0 10*3/uL (ref 0.0–0.1)
Basophils Relative: 1 %
Eosinophils Absolute: 0.2 10*3/uL (ref 0.0–0.5)
Eosinophils Relative: 4 %
HCT: 52.5 % — ABNORMAL HIGH (ref 39.0–52.0)
Hemoglobin: 16.6 g/dL (ref 13.0–17.0)
Immature Granulocytes: 1 %
Lymphocytes Relative: 22 %
Lymphs Abs: 1.4 10*3/uL (ref 0.7–4.0)
MCH: 27.1 pg (ref 26.0–34.0)
MCHC: 31.6 g/dL (ref 30.0–36.0)
MCV: 85.6 fL (ref 80.0–100.0)
Monocytes Absolute: 0.7 10*3/uL (ref 0.1–1.0)
Monocytes Relative: 11 %
Neutro Abs: 4.1 10*3/uL (ref 1.7–7.7)
Neutrophils Relative %: 61 %
Platelets: 169 10*3/uL (ref 150–400)
RBC: 6.13 MIL/uL — ABNORMAL HIGH (ref 4.22–5.81)
RDW: 12.9 % (ref 11.5–15.5)
WBC: 6.5 10*3/uL (ref 4.0–10.5)
nRBC: 0 % (ref 0.0–0.2)

## 2019-02-12 LAB — COMPREHENSIVE METABOLIC PANEL
ALBUMIN: 3.9 g/dL (ref 3.5–5.0)
ALK PHOS: 96 U/L (ref 38–126)
ALT: 28 U/L (ref 0–44)
AST: 26 U/L (ref 15–41)
Anion gap: 13 (ref 5–15)
BILIRUBIN TOTAL: 0.9 mg/dL (ref 0.3–1.2)
BUN: 18 mg/dL (ref 8–23)
CALCIUM: 9.9 mg/dL (ref 8.9–10.3)
CO2: 23 mmol/L (ref 22–32)
Chloride: 101 mmol/L (ref 98–111)
Creatinine, Ser: 1.85 mg/dL — ABNORMAL HIGH (ref 0.61–1.24)
GFR calc Af Amer: 40 mL/min — ABNORMAL LOW (ref >60)
GFR calc non Af Amer: 35 mL/min — ABNORMAL LOW (ref >60)
GLUCOSE: 389 mg/dL — AB (ref 70–99)
Potassium: 4 mmol/L (ref 3.5–5.1)
SODIUM: 137 mmol/L (ref 135–145)
TOTAL PROTEIN: 7.5 g/dL (ref 6.5–8.1)

## 2019-02-12 LAB — URINALYSIS, ROUTINE W REFLEX MICROSCOPIC

## 2019-02-12 LAB — URINALYSIS, MICROSCOPIC (REFLEX): RBC / HPF: >50 RBC/hpf (ref 0–5)

## 2019-02-12 LAB — LIPASE, BLOOD: Lipase: 37 U/L (ref 11–51)

## 2019-02-12 LAB — LACTIC ACID, PLASMA
LACTIC ACID, VENOUS: 2.2 mmol/L — AB (ref 0.5–1.9)
Lactic Acid, Venous: 1.7 mmol/L (ref 0.5–1.9)

## 2019-02-12 MED ORDER — MORPHINE SULFATE (PF) 4 MG/ML IV SOLN
4.0000 mg | Freq: Once | INTRAVENOUS | Status: AC
Start: 2019-02-12 — End: 2019-02-12
  Administered 2019-02-12: 4 mg via INTRAVENOUS
  Filled 2019-02-12: qty 1

## 2019-02-12 MED ORDER — SODIUM CHLORIDE 0.9 % IV BOLUS
1000.0000 mL | Freq: Once | INTRAVENOUS | Status: AC
  Administered 2019-02-12: 1000 mL via INTRAVENOUS

## 2019-02-12 MED ORDER — ONDANSETRON HCL 4 MG/2ML IJ SOLN
4.0000 mg | Freq: Once | INTRAMUSCULAR | Status: AC
  Administered 2019-02-12: 4 mg via INTRAVENOUS
  Filled 2019-02-12: qty 2

## 2019-02-12 MED ORDER — LIDOCAINE HCL URETHRAL/MUCOSAL 2 % EX GEL
1.0000 "application " | Freq: Once | CUTANEOUS | Status: AC
  Administered 2019-02-12: 1 via TOPICAL
  Filled 2019-02-12: qty 20

## 2019-02-12 NOTE — Discharge Instructions (Signed)
You were evaluated in the emergency department for worsening blood in your urine and severe lower abdominal pain.  Your symptoms improved with placement of a catheter.  You will be important for you to stay well-hydrated and please follow-up with your urologist.  Please return if any worsening symptoms.

## 2019-02-12 NOTE — ED Triage Notes (Signed)
Pt arrives to ED from home with complaints of right flank pain, intermittent blood clots with urination since a month ago. EMS reports pt was diaphoretic and in severe pain on arrival, given 117mcg en route. CBG 374, which is unusual for the pt. Pt placed in position of comfort with bed locked and lowered, call bell in reach.

## 2019-02-12 NOTE — ED Notes (Signed)
Patient verbalizes understanding of discharge instructions. Opportunity for questioning and answers were provided. Armband removed by staff, pt discharged from ED in wheelchair.  

## 2019-02-12 NOTE — ED Provider Notes (Signed)
[facility] EMERGENCY DEPARTMENT Provider Note   CSN: [id number] Arrival date & time: 02/12/19  1818    History   Chief Complaint Chief Complaint  Patient presents with  . Code Sepsis    HPI Wayne [name] is a 78 y.o. male.  He has a history of bladder and prostate cancer and is on anticoagulation for paroxysmal A. fib.  He has had on and off hematuria for a month.  He has been complaining of hematuria and clots with every urination today and experienced 10 out of 10 suprapubic and right lower quadrant pain starting a couple hours ago.  It is not relieved or worsened with anything.  Is associated with some nausea and diaphoresis.  No reported fever but he said he has been sweaty.  No chest pain or shortness of breath.  No recent trauma.  Denies any vomiting or stooling of blood.  He said had a kidney stone in the past and this does not feel like that.      The history is provided by the patient.  Abdominal Pain  Pain location:  Suprapubic Pain quality: stabbing   Pain radiates to:  Does not radiate Pain severity:  Severe Onset quality:  Sudden Timing:  Constant Progression:  Unchanged Chronicity:  New Context: not recent illness, not recent travel, not sick contacts and not trauma   Relieved by:  Nothing Worsened by:  Nothing Ineffective treatments:  Lying down Associated symptoms: hematuria and nausea   Associated symptoms: no chest pain, no chills, no cough, no diarrhea, no dysuria, no fever, no hematemesis, no hematochezia, no shortness of breath, no sore throat and no vomiting     Past Medical History:  Diagnosis Date  . AICD (automatic cardioverter/defibrillator) present   . Arthritis   . Cancer [facility])    bladder and prostate  . Cellulitis of left leg    a. 01/6947 complicated by septic shock  . Chronic systolic CHF (congestive heart failure) ([place])   . CKD (chronic kidney disease), stage III ([place])    pt denies  . Colon polyps   . CORONARY  ATHEROSCLEROSIS NATIVE CORONARY ARTERY cardiologist-  dr Aundra Dubin   a. 01/2011 Cath/PCI: LM nl, LAD 40-50p, D1 80-small, LCX 95-small, RI 90, RCA 100, EF 20%;  b. 01/2011 Card MRI - No transmural scar;  c. 01/2011 PCI RCA->5 Promus DES, RI->3.0x16 Promus DES; d. Cath 01/13/13 patent LAD & Ramus, diffuse LCx dz, RCA mult overlapping stents w/ 95% osital stenosis, EF 20%, s/p DES to ostial/prox RCA 01/24/13   . Diabetes mellitus, type 2 ([place])    type 2  . GERD (gastroesophageal reflux disease)   . Hematoma of leg    a. left leg hematoma 03/2012 in the setting of asa/effient  . Herpes zoster ophthalmicus   . History of non-ST elevation myocardial infarction (NSTEMI) 06/2012  . History of prostate cancer    dx 2009---  s/p radioactive seed implants 11-24-2008  . HYPERLIPIDEMIA    intolerant to Lipitor (myalgias)  . HYPERTENSION   . Ischemic cardiomyopathy    a. 01/2012 Echo EF 45%, mild LVH; b. NI62%, grade 1 diastolic dysfunction, diffuse hypokinesis, inferoposterior akinesis   . Myocardial infarction ([place])    x2  . Noncompliance   . Obesity   . OSA (obstructive sleep apnea)    dx 2012  severe osa  no cpap just uses nose strips  . PAF (paroxysmal atrial fibrillation) ([place])    first dx 01-16-2017  .  Polymyalgia rheumatica ([place])   . S/P drug eluting coronary stent placement    total 6 DES    Patient Active Problem List   Diagnosis Date Noted  . Bladder cancer ([place]) 11/09/2017  . Low back pain radiating to left lower extremity 09/21/2017  . Foot drop, left 08/24/2017  . Diabetes mellitus ([place])   . ICD (implantable cardioverter-defibrillator) discharge 07/05/2017  . Syncope and collapse   . Ventricular tachycardia ([place]) 06/18/2017  . Paroxysmal atrial fibrillation ([place]) 06/10/2017  . Hypoxemia   . VT (ventricular tachycardia) ([place])   . Cardiac arrest ([place]) 05/22/2017  . Defibrillator discharge 02/11/2017  . Atrial fibrillation with rapid ventricular response ([place]) 01/16/2017  . Coronary  stent occlusion 11/18/2016  . Type 2 diabetes mellitus, uncontrolled ([place]) 09/30/2016  . CKD (chronic kidney disease) stage 3, GFR 30-59 ml/min ([facility]) 09/30/2016  . CAD (coronary artery disease) 11/20/2015  . Leucocytoclastic vasculitis ([place]) 06/25/2012  . Staphylococcus aureus bacteremia with sepsis ([place]) 06/18/2012  . NSTEMI, initial episode of care ([place]) 06/16/2012  . Cellulitis and abscess of lower leg 06/14/2012    Class: Acute  . Healthcare-associated pneumonia 06/14/2012    Class: Acute  . Hyperglycemia 06/14/2012    Class: Acute  . Leg swelling 04/22/2012  . Urethral stricture 04/15/2012  . Gross hematuria 03/22/2012  . Hematochezia 03/22/2012  . Obesity 01/08/2012  . ED (erectile dysfunction) of organic origin 09/11/2011  . OSA (obstructive sleep apnea) 09/02/2011  . Systolic CHF, chronic ([place]) 06/10/2011  . HEMATOCHEZIA 02/10/2011  . BURSITIS, LEFT HIP 02/10/2011  . CORONARY ATHEROSCLEROSIS NATIVE CORONARY ARTERY 01/27/2011  . CARDIOMYOPATHY 01/21/2011  . CHEST PAIN UNSPECIFIED 01/20/2011  . DYSPNEA 01/16/2011  . ABDOMINAL PAIN, UNSPECIFIED SITE 01/13/2011  . ECZEMA 01/28/2010  . BRONCHITIS, ACUTE WITH MILD BRONCHOSPASM 12/21/2009  . HERPES ZOSTER OPHTHALMICUS 07/19/2009  . ADENOCARCINOMA, PROSTATE 06/20/2009  . Secondary DM with peripheral vascular disease, uncontrolled ([place]) 06/20/2009  . Hyperlipidemia 06/20/2009  . Essential hypertension 06/20/2009  . HYPERTROPHY PROSTATE W/UR OBST & OTH LUTS 06/20/2009    Past Surgical History:  Procedure Laterality Date  . CARDIAC CATHETERIZATION  01/24/2013  . CARDIAC CATHETERIZATION N/A 11/14/2016   Procedure: Right/Left Heart Cath and Coronary Angiography;  Surgeon: Larey Dresser, MD;  Location: [facility] CV LAB;  Service: Cardiovascular;  Laterality: N/A;  . CARDIAC CATHETERIZATION N/A 11/17/2016   Procedure: Coronary Stent Intervention w/Impella;  Surgeon: Peter M Martinique, MD;  Location: [facility];  Service:  Cardiovascular;  Laterality: N/A;  . CARDIAC CATHETERIZATION N/A 11/17/2016   Procedure: Coronary Atherectomy;  Surgeon: Peter M Martinique, MD;  Location: [facility] LAB;  Service: Cardiovascular;  Laterality: N/A;  . CORONARY ANGIOPLASTY WITH STENT PLACEMENT  01-30-2011   dr Aundra Dubin   PTCA and multiple overlapping DEStenting to RCA and 1 DES to RI   . CORONARY BALLOON ANGIOPLASTY N/A 07/06/2017   Procedure: Coronary Balloon Angioplasty;  Surgeon: Sherren Mocha, MD;  Location: [facility] CV LAB;  Service: Cardiovascular;  Laterality: N/A;  . CYSTOSCOPY WITH FULGERATION N/A 05/25/2018   Procedure: CYSTOSCOPY WITH FULGERATION/BLADDER BIOPSY , 15 mm lesion;  Surgeon: Festus Aloe, MD;  Location: [facility];  Service: Urology;  Laterality: N/A;  . I&D EXTREMITY  06/15/2012   Procedure: IRRIGATION AND DEBRIDEMENT EXTREMITY;  Surgeon: Newt Minion, MD;  Location: [facility];  Service: Orthopedics;  Laterality: Left;  I&D Left Posterior Knee  . I&D EXTREMITY  06/30/2012   Procedure: IRRIGATION AND DEBRIDEMENT EXTREMITY;  Surgeon: Illene Regulus  Sharol Given, MD;  Location: [facility];  Service: Orthopedics;  Laterality: Left;  Left Leg Irrigation and Debridement and placement of Wound VAC and application of  A-cell  . I&D EXTREMITY  07/20/2012   Procedure: IRRIGATION AND DEBRIDEMENT EXTREMITY;  Surgeon: Newt Minion, MD;  Location: [facility];  Service: Orthopedics;  Laterality: Left;  Irrigation and Debridement Left Leg and Place antibiotic beads   . ICD IMPLANT N/A 02/12/2017   Procedure: ICD Implant;  Surgeon: Evans Lance, MD;  Location: [facility] LAB;  Service: Cardiovascular;  Laterality: N/A;  . LAPAROSCOPIC INGUINAL HERNIA REPAIR Right 09-11-2008   dr Barry Dienes  [facility]  . LEAD REVISION/REPAIR N/A 05/26/2017   Procedure: Lead Revision/Repair;  Surgeon: Evans Lance, MD;  Location: [facility] CV LAB;  Service: Cardiovascular;  Laterality: N/A;  . LEFT HEART CATH AND CORONARY ANGIOGRAPHY N/A 07/06/2017   Procedure: Left  Heart Cath and Coronary Angiography;  Surgeon: Larey Dresser, MD;  Location: [facility] LAB;  Service: Cardiovascular;  Laterality: N/A;  . PERCUTANEOUS CORONARY STENT INTERVENTION (PCI-S) N/A 01/24/2013   Procedure: PERCUTANEOUS CORONARY STENT INTERVENTION (PCI-S);  Surgeon: Wellington Hampshire, MD;  [facility];  Service: Cardiovascular;  Laterality: N/A;  . RADIOACTIVE PROSTATE SEED IMPLANTS  11-24-2008   dr Tresa Endo  [facility]  . TRANSURETHRAL RESECTION OF BLADDER TUMOR N/A 10/30/2017   Procedure: TRANSURETHRAL RESECTION OF BLADDER TUMOR (TURBT)/ INSTILLATION OF EPIRUBICIN;  Surgeon: Festus Aloe, MD;  [facility];  Service: Urology;  Laterality: N/A;  . ULTRASOUND GUIDANCE FOR VASCULAR ACCESS  11/14/2016   Procedure: Ultrasound Guidance For Vascular Access;  Surgeon: Larey Dresser, MD;  Location: [facility] CV LAB;  Service: Cardiovascular;;  . Stephanie Coup ABLATION N/A 06/19/2017   Procedure: Stephanie Coup Ablation;  Surgeon: Evans Lance, MD;  Location: [facility] CV LAB;  Service: Cardiovascular;  Laterality: N/A;        Home Medications    Prior to Admission medications   Medication Sig Start Date End Date Taking? Authorizing Provider  ACCU-CHEK AVIVA PLUS test strip CHECK BLOOD SUGAR ONCE TO TWICE DAILY. 02/15/18   Burchette, Alinda Sierras, MD  amiodarone (PACERONE) 200 MG tablet Take one tablet by mouth Monday through Saturday.  Do NOT take on Sunday. 05/25/18   Evans Lance, MD  carvedilol (COREG) 12.5 MG tablet Take 1 tablet (12.5 mg total) by mouth 2 (two) times daily. NEED FOLLOW UP APPT 02/10/19   Larey Dresser, MD  diphenhydramine-acetaminophen (TYLENOL PM) 25-500 MG TABS tablet Take 1 tablet at bedtime as needed by mouth (for sleep).     [provider]  furosemide (LASIX) 20 MG tablet Take 1 tablet (20 mg total) by mouth 2 (two) times daily. 05/17/18   Bensimhon, Shaune Pascal, MD  Multiple Vitamins-Minerals (CENTRUM SILVER ADULT 50+) TABS Take 1 tablet by mouth  daily.    [provider]  Omega-3 Fatty Acids (FISH OIL) 1000 MG CAPS Take 1,000 mg by mouth 2 (two) times daily.     [provider]  rivaroxaban (XARELTO) 20 MG TABS tablet Take 1 tablet (20 mg total) by mouth daily with supper. 05/27/18   Festus Aloe, MD  rosuvastatin (CRESTOR) 10 MG tablet Take 1 tablet (10 mg total) by mouth daily. 04/01/18   Larey Dresser, MD  spironolactone (ALDACTONE) 25 MG tablet Take 1 tablet (25 mg total) by mouth every evening. 06/22/18   Larey Dresser, MD    Family History  Family History  Problem Relation Age of Onset  . Cancer Mother 31       unknown CA  . Heart disease Father   . Alcohol abuse Father   . Heart attack Father 63  . Heart attack Brother     Social History Social History   Tobacco Use  . Smoking status: Former Smoker    Packs/day: 0.30    Years: 20.00    Pack years: 6.00    Types: Cigarettes    Last attempt to quit: 02/23/1989    Years since quitting: 29.9  . Smokeless tobacco: Never Used  Substance Use Topics  . Alcohol use: No  . Drug use: No     Allergies   Entresto [sacubitril-valsartan]; Crestor [rosuvastatin calcium]; Lipitor [atorvastatin]; Plavix [clopidogrel]; and Ancef [cefazolin]   Review of Systems Review of Systems  Constitutional: Negative for chills and fever.  HENT: Negative for sore throat.   Eyes: Negative for visual disturbance.  Respiratory: Negative for cough and shortness of breath.   Cardiovascular: Negative for chest pain.  Gastrointestinal: Positive for nausea. Negative for abdominal pain, diarrhea, hematemesis, hematochezia and vomiting.  Genitourinary: Positive for hematuria. Negative for dysuria.  Musculoskeletal: Negative for neck pain.  Skin: Negative for rash.  Neurological: Negative for headaches.     Physical Exam Updated Vital Signs BP (!) 172/116 (BP Location: Left Arm)   Pulse 89   Temp 98 F (36.7 C) (Oral)   Resp 19   Ht 5\' 8"  (1.727 m)   Wt 101.6  kg   SpO2 97%   BMI 34.06 kg/m   Physical Exam Vitals signs and nursing note reviewed.  Constitutional:      Appearance: He is well-developed.  HENT:     Head: Normocephalic and atraumatic.  Eyes:     Conjunctiva/sclera: Conjunctivae normal.  Neck:     Musculoskeletal: Neck supple.  Cardiovascular:     Rate and Rhythm: Normal rate and regular rhythm.     Heart sounds: No murmur.  Pulmonary:     Effort: Pulmonary effort is normal. No respiratory distress.     Breath sounds: Normal breath sounds.  Abdominal:     Palpations: Abdomen is soft.     Tenderness: There is abdominal tenderness (suprapubic). There is no guarding or rebound.  Musculoskeletal: Normal range of motion.        General: No tenderness or signs of injury.  Skin:    General: Skin is warm and dry.     Capillary Refill: Capillary refill takes less than 2 seconds.  Neurological:     General: No focal deficit present.     Mental Status: He is alert and oriented to person, place, and time. Mental status is at baseline.      ED Treatments / Results  Labs (all labs ordered are listed, but only abnormal results are displayed) Labs Reviewed  COMPREHENSIVE METABOLIC PANEL - Abnormal; Notable for the following components:      Result Value   Glucose, Bld 389 (*)    Creatinine, Ser 1.85 (*)    GFR calc non Af Amer 35 (*)    GFR calc Af Amer 40 (*)    All other components within normal limits  CBC WITH DIFFERENTIAL/PLATELET - Abnormal; Notable for the following components:   RBC 6.13 (*)    HCT 52.5 (*)    All other components within normal limits  URINALYSIS, ROUTINE W REFLEX MICROSCOPIC - Abnormal; Notable for the following components:   Color, Urine  RED (*)    APPearance TURBID (*)    Glucose, UA   (*)    Value: TEST NOT REPORTED DUE TO COLOR INTERFERENCE OF URINE PIGMENT   Hgb urine dipstick   (*)    Value: TEST NOT REPORTED DUE TO COLOR INTERFERENCE OF URINE PIGMENT   Bilirubin Urine   (*)    Value:  TEST NOT REPORTED DUE TO COLOR INTERFERENCE OF URINE PIGMENT   Ketones, ur   (*)    Value: TEST NOT REPORTED DUE TO COLOR INTERFERENCE OF URINE PIGMENT   Protein, ur   (*)    Value: TEST NOT REPORTED DUE TO COLOR INTERFERENCE OF URINE PIGMENT   Nitrite   (*)    Value: TEST NOT REPORTED DUE TO COLOR INTERFERENCE OF URINE PIGMENT   Leukocytes,Ua   (*)    Value: TEST NOT REPORTED DUE TO COLOR INTERFERENCE OF URINE PIGMENT   All other components within normal limits  LACTIC ACID, PLASMA - Abnormal; Notable for the following components:   Lactic Acid, Venous 2.2 (*)    All other components within normal limits  URINALYSIS, MICROSCOPIC (REFLEX) - Abnormal; Notable for the following components:   Bacteria, UA RARE (*)    All other components within normal limits  URINE CULTURE  CULTURE, BLOOD (ROUTINE X 2)  CULTURE, BLOOD (ROUTINE X 2)  LIPASE, BLOOD  LACTIC ACID, PLASMA    EKG None  Radiology Ct Renal Stone Study  Result Date: 02/12/2019 CLINICAL DATA:  Suprapubic and right flank pain. Blood clots with urination for a month. EXAM: CT ABDOMEN AND PELVIS WITHOUT CONTRAST TECHNIQUE: Multidetector CT imaging of the abdomen and pelvis was performed following the standard protocol without IV contrast. COMPARISON:  08/24/2017 FINDINGS: Lower chest: Atelectasis or consolidation in the lung bases, greater on the left. Small amount of fluid in the left major fissure. Mild cardiac enlargement. Coronary artery calcifications. Hepatobiliary: Cholelithiasis. No inflammatory changes in the gallbladder wall. No bile duct dilatation. No focal liver lesions. Pancreas: Unremarkable. No pancreatic ductal dilatation or surrounding inflammatory changes. Spleen: Normal in size without focal abnormality. Adrenals/Urinary Tract: No adrenal gland nodules. Cyst on the lower pole right kidney without change. No hydronephrosis or hydroureter. No renal, ureteral, or bladder stones. Bladder is decompressed with a Foley  catheter in place, limiting evaluation. Stomach/Bowel: Stomach, small bowel, and colon are mostly decompressed with scattered stool in the colon. No wall thickening or inflammatory changes. Appendix is normal. Vascular/Lymphatic: Aortic atherosclerosis. No enlarged abdominal or pelvic lymph nodes. Reproductive: Prostate is atrophic with seed implants in place consistent with prior radiation therapy for prostate cancer. Other: No free air or free fluid in the abdomen. Postoperative changes consistent with right hernia repair. Musculoskeletal: Degenerative changes in the spine. No destructive or sclerotic bone lesions identified. IMPRESSION: 1. Atelectasis or consolidation in the lung bases, greater on the left. 2. Cholelithiasis without evidence of cholecystitis. 3. No renal or ureteral stone or obstruction. Bladder decompressed with Foley catheter. No cause of hematuria identified. 4. Prostate seed implants. Aortic Atherosclerosis (ICD10-I70.0). Electronically Signed   By: Lucienne Capers M.D.   On: 02/12/2019 20:59    Procedures BLADDER CATHETERIZATION Date/Time: 02/12/2019 7:27 PM Performed by: Hayden Rasmussen, MD Authorized by: Hayden Rasmussen, MD   Consent:    Consent obtained:  Verbal   Consent given by:  Patient   Risks discussed:  Pain and urethral injury   Alternatives discussed:  Delayed treatment and no treatment Pre-procedure details:    Procedure  purpose:  Therapeutic Anesthesia (see MAR for exact dosages):    Anesthesia method:  Topical application   Topical anesthetic:  Lidocaine gel Procedure details:    Provider performed due to:  Nurse unable to complete   Catheter insertion:  Indwelling   Catheter type:  Coude   Catheter size:  18 Fr   Bladder irrigation: no     Number of attempts:  1   Urine characteristics:  Bloody Post-procedure details:    Patient tolerance of procedure:  Tolerated well, no immediate complications   (including critical care time)  Medications  Ordered in ED Medications  sodium chloride 0.9 % bolus 1,000 mL (has no administration in time range)  morphine 4 MG/ML injection 4 mg (has no administration in time range)  ondansetron (ZOFRAN) injection 4 mg (has no administration in time range)     Initial Impression / Assessment and Plan / ED Course  I have reviewed the triage vital signs and the nursing notes.  Pertinent labs & imaging results that were available during my care of the patient were reviewed by me and considered in my medical decision making (see chart for details).  Clinical Course as of Feb 13 1415  Sat Feb 12, 2019  1918 Patient remains in a lot of pain and is having a lot of bladder spasm.  Is asked by the nurse to place a coud catheter.  Patient was agreeable to catheter placement and I passed an 17 [place] coud with Urojet topical lidocaine.  There was a little bit resistance at the bladder but passed in the restraining bloody thin urine.   [MB]  1931 After placing the catheter patient is draining 800 cc of bloody urine.  Pain is much improved.   [MB]  1946 Patient afebrile slightly hypertensive not tachycardic normal side.  He was in significant pain and his lactate is elevated here but I doubt this is sepsis currently.  He is receiving some IV fluids.  His pain is much improved after a urethral catheter was placed.  We will follow this.   [MB]  2149 Patient feels back to baseline.  He is comfortable with going home and I think that is reasonable.  He continues to pass thin urine blood-tinged.  He understands to follow-up with his urologist calling in the Monday.  They will return if any worsening symptoms.   [MB]    Clinical Course User Index [MB] Hayden Rasmussen, MD        Final Clinical Impressions(s) / ED Diagnoses   Final diagnoses:  Gross hematuria  Urinary retention    ED Discharge Orders    None       Hayden Rasmussen, MD 02/13/19 [id number]

## 2019-02-13 ENCOUNTER — Encounter (HOSPITAL_COMMUNITY): Payer: Self-pay | Admitting: *Deleted

## 2019-02-13 ENCOUNTER — Emergency Department (HOSPITAL_COMMUNITY)
Admission: EM | Admit: 2019-02-13 | Discharge: 2019-02-13 | Disposition: A | Discharge status: Home / Self Care | Payer: Medicare Other | Attending: Emergency Medicine | Admitting: Emergency Medicine

## 2019-02-13 ENCOUNTER — Other Ambulatory Visit: Payer: Self-pay

## 2019-02-13 DIAGNOSIS — Z79899 Other long term (current) drug therapy: Secondary | ICD-10-CM | POA: Diagnosis not present

## 2019-02-13 DIAGNOSIS — R339 Retention of urine, unspecified: Secondary | ICD-10-CM | POA: Diagnosis not present

## 2019-02-13 DIAGNOSIS — I130 Hypertensive heart and chronic kidney disease with heart failure and stage 1 through stage 4 chronic kidney disease, or unspecified chronic kidney disease: Secondary | ICD-10-CM | POA: Insufficient documentation

## 2019-02-13 DIAGNOSIS — Z87891 Personal history of nicotine dependence: Secondary | ICD-10-CM | POA: Diagnosis not present

## 2019-02-13 DIAGNOSIS — N183 Chronic kidney disease, stage 3 (moderate): Secondary | ICD-10-CM | POA: Diagnosis not present

## 2019-02-13 DIAGNOSIS — E119 Type 2 diabetes mellitus without complications: Secondary | ICD-10-CM | POA: Insufficient documentation

## 2019-02-13 DIAGNOSIS — I5022 Chronic systolic (congestive) heart failure: Secondary | ICD-10-CM | POA: Insufficient documentation

## 2019-02-13 LAB — URINE CULTURE: Culture: NO GROWTH

## 2019-02-13 NOTE — ED Provider Notes (Addendum)
[facility] EMERGENCY DEPARTMENT Provider Note   CSN: [id number] Arrival date & time: 02/13/19  1211    History   Chief Complaint Chief Complaint  Patient presents with  . Urinary Retention    HPI Wayne [name] is a 78 y.o. male with a PMH of bladder and prostate cancer, AICD, CHF, and CKD presenting with urinary retention onset yesterday. Wife is a contributing historian. Patient reports he evaluated yesterday for urinary retention and hematuria. Patient reports he had an foley catheter inserted with relief. Patient states it relieved his pain, but he was unable to urinate again last night. Patient states he drank half a bottle of Gatorade. Patient reports constant suprapubic abdominal pain that he describes as pressure. Patient denies fever, nausea, vomiting, or diarrhea. Patient denies back pain or dysuria. Patient states his urologist is Dr. Junious Silk, but he has not made an appointment to see him yet.      HPI  Past Medical History:  Diagnosis Date  . AICD (automatic cardioverter/defibrillator) present   . Arthritis   . Cancer [facility])    bladder and prostate  . Cellulitis of left leg    a. 01/9923 complicated by septic shock  . Chronic systolic CHF (congestive heart failure) ([place])   . CKD (chronic kidney disease), stage III ([place])    pt denies  . Colon polyps   . CORONARY ATHEROSCLEROSIS NATIVE CORONARY ARTERY cardiologist-  dr Aundra Dubin   a. 01/2011 Cath/PCI: LM nl, LAD 40-50p, D1 80-small, LCX 95-small, RI 90, RCA 100, EF 20%;  b. 01/2011 Card MRI - No transmural scar;  c. 01/2011 PCI RCA->5 Promus DES, RI->3.0x16 Promus DES; d. Cath 01/13/13 patent LAD & Ramus, diffuse LCx dz, RCA mult overlapping stents w/ 95% osital stenosis, EF 20%, s/p DES to ostial/prox RCA 01/24/13   . Diabetes mellitus, type 2 ([place])    type 2  . GERD (gastroesophageal reflux disease)   . Hematoma of leg    a. left leg hematoma 03/2012 in the setting of asa/effient  . Herpes zoster  ophthalmicus   . History of non-ST elevation myocardial infarction (NSTEMI) 06/2012  . History of prostate cancer    dx 2009---  s/p radioactive seed implants 11-24-2008  . HYPERLIPIDEMIA    intolerant to Lipitor (myalgias)  . HYPERTENSION   . Ischemic cardiomyopathy    a. 01/2012 Echo EF 45%, mild LVH; b. QA83%, grade 1 diastolic dysfunction, diffuse hypokinesis, inferoposterior akinesis   . Myocardial infarction ([place])    x2  . Noncompliance   . Obesity   . OSA (obstructive sleep apnea)    dx 2012  severe osa  no cpap just uses nose strips  . PAF (paroxysmal atrial fibrillation) ([place])    first dx 01-16-2017  . Polymyalgia rheumatica ([place])   . S/P drug eluting coronary stent placement    total 6 DES    Patient Active Problem List   Diagnosis Date Noted  . Bladder cancer ([place]) 11/09/2017  . Low back pain radiating to left lower extremity 09/21/2017  . Foot drop, left 08/24/2017  . Diabetes mellitus ([place])   . ICD (implantable cardioverter-defibrillator) discharge 07/05/2017  . Syncope and collapse   . Ventricular tachycardia ([place]) 06/18/2017  . Paroxysmal atrial fibrillation ([place]) 06/10/2017  . Hypoxemia   . VT (ventricular tachycardia) ([place])   . Cardiac arrest ([place]) 05/22/2017  . Defibrillator discharge 02/11/2017  . Atrial fibrillation with rapid ventricular response ([place]) 01/16/2017  . Coronary stent occlusion 11/18/2016  .  Type 2 diabetes mellitus, uncontrolled ([place]) 09/30/2016  . CKD (chronic kidney disease) stage 3, GFR 30-59 ml/min (HCC) 09/30/2016  . CAD (coronary artery disease) 11/20/2015  . Leucocytoclastic vasculitis ([place]) 06/25/2012  . Staphylococcus aureus bacteremia with sepsis ([place]) 06/18/2012  . NSTEMI, initial episode of care ([place]) 06/16/2012  . Cellulitis and abscess of lower leg 06/14/2012    Class: Acute  . Healthcare-associated pneumonia 06/14/2012    Class: Acute  . Hyperglycemia 06/14/2012    Class: Acute  . Leg swelling 04/22/2012  . Urethral  stricture 04/15/2012  . Gross hematuria 03/22/2012  . Hematochezia 03/22/2012  . Obesity 01/08/2012  . ED (erectile dysfunction) of organic origin 09/11/2011  . OSA (obstructive sleep apnea) 09/02/2011  . Systolic CHF, chronic ([place]) 06/10/2011  . HEMATOCHEZIA 02/10/2011  . BURSITIS, LEFT HIP 02/10/2011  . CORONARY ATHEROSCLEROSIS NATIVE CORONARY ARTERY 01/27/2011  . CARDIOMYOPATHY 01/21/2011  . CHEST PAIN UNSPECIFIED 01/20/2011  . DYSPNEA 01/16/2011  . ABDOMINAL PAIN, UNSPECIFIED SITE 01/13/2011  . ECZEMA 01/28/2010  . BRONCHITIS, ACUTE WITH MILD BRONCHOSPASM 12/21/2009  . HERPES ZOSTER OPHTHALMICUS 07/19/2009  . ADENOCARCINOMA, PROSTATE 06/20/2009  . Secondary DM with peripheral vascular disease, uncontrolled ([place]) 06/20/2009  . Hyperlipidemia 06/20/2009  . Essential hypertension 06/20/2009  . HYPERTROPHY PROSTATE W/UR OBST & OTH LUTS 06/20/2009    Past Surgical History:  Procedure Laterality Date  . CARDIAC CATHETERIZATION  01/24/2013  . CARDIAC CATHETERIZATION N/A 11/14/2016   Procedure: Right/Left Heart Cath and Coronary Angiography;  Surgeon: Larey Dresser, MD;  Location: [place] CV LAB;  Service: Cardiovascular;  Laterality: N/A;  . CARDIAC CATHETERIZATION N/A 11/17/2016   Procedure: Coronary Stent Intervention w/Impella;  Surgeon: Peter M Martinique, MD;  Location: [place] CV LAB;  Service: Cardiovascular;  Laterality: N/A;  . CARDIAC CATHETERIZATION N/A 11/17/2016   Procedure: Coronary Atherectomy;  Surgeon: Peter M Martinique, MD;  Location: [place] CV LAB;  Service: Cardiovascular;  Laterality: N/A;  . CORONARY ANGIOPLASTY WITH STENT PLACEMENT  01-30-2011   dr Aundra Dubin   PTCA and multiple overlapping DEStenting to RCA and 1 DES to RI   . CORONARY BALLOON ANGIOPLASTY N/A 07/06/2017   Procedure: Coronary Balloon Angioplasty;  Surgeon: Sherren Mocha, MD;  Location: [facility] LAB;  Service: Cardiovascular;  Laterality: N/A;  . CYSTOSCOPY WITH FULGERATION N/A  05/25/2018   Procedure: CYSTOSCOPY WITH FULGERATION/BLADDER BIOPSY , 15 mm lesion;  Surgeon: Festus Aloe, MD;  Location: [facility];  Service: Urology;  Laterality: N/A;  . I&D EXTREMITY  06/15/2012   Procedure: IRRIGATION AND DEBRIDEMENT EXTREMITY;  Surgeon: Newt Minion, MD;  Location: [facility];  Service: Orthopedics;  Laterality: Left;  I&D Left Posterior Knee  . I&D EXTREMITY  06/30/2012   Procedure: IRRIGATION AND DEBRIDEMENT EXTREMITY;  Surgeon: Newt Minion, MD;  Location: [facility];  Service: Orthopedics;  Laterality: Left;  Left Leg Irrigation and Debridement and placement of Wound VAC and application of  A-cell  . I&D EXTREMITY  07/20/2012   Procedure: IRRIGATION AND DEBRIDEMENT EXTREMITY;  Surgeon: Newt Minion, MD;  Location: [facility];  Service: Orthopedics;  Laterality: Left;  Irrigation and Debridement Left Leg and Place antibiotic beads   . ICD IMPLANT N/A 02/12/2017   Procedure: ICD Implant;  Surgeon: Evans Lance, MD;  Location: [place] [facility] CV LAB;  Service: Cardiovascular;  Laterality: N/A;  . LAPAROSCOPIC INGUINAL HERNIA REPAIR Right 09-11-2008   dr Barry Dienes  [facility]  . LEAD REVISION/REPAIR N/A 05/26/2017   Procedure: Lead Revision/Repair;  Surgeon: Lovena Le,  Champ Mungo, MD;  Location: [facility] CV LAB;  Service: Cardiovascular;  Laterality: N/A;  . LEFT HEART CATH AND CORONARY ANGIOGRAPHY N/A 07/06/2017   Procedure: Left Heart Cath and Coronary Angiography;  Surgeon: Larey Dresser, MD;  Location: [facility] [facility] LAB;  Service: Cardiovascular;  Laterality: N/A;  . PERCUTANEOUS CORONARY STENT INTERVENTION (PCI-S) N/A 01/24/2013   Procedure: PERCUTANEOUS CORONARY STENT INTERVENTION (PCI-S);  Surgeon: Wellington Hampshire, MD;  Location: [facility] CATH LAB;  Service: Cardiovascular;  Laterality: N/A;  . RADIOACTIVE PROSTATE SEED IMPLANTS  11-24-2008   dr Tresa Endo  [facility]  . TRANSURETHRAL RESECTION OF BLADDER TUMOR N/A 10/30/2017   Procedure: TRANSURETHRAL RESECTION OF BLADDER TUMOR (TURBT)/ INSTILLATION OF  EPIRUBICIN;  Surgeon: Festus Aloe, MD;  [facility];  Service: Urology;  Laterality: N/A;  . ULTRASOUND GUIDANCE FOR VASCULAR ACCESS  11/14/2016   Procedure: Ultrasound Guidance For Vascular Access;  Surgeon: Larey Dresser, MD;  Location: [facility] CV LAB;  Service: Cardiovascular;;  . Stephanie Coup ABLATION N/A 06/19/2017   Procedure: Stephanie Coup Ablation;  Surgeon: Evans Lance, MD;  Location: [facility] CV LAB;  Service: Cardiovascular;  Laterality: N/A;        Home Medications    Prior to Admission medications   Medication Sig Start Date End Date Taking? Authorizing Provider  ACCU-CHEK AVIVA PLUS test strip CHECK BLOOD SUGAR ONCE TO TWICE DAILY. Patient taking differently: 1 each by Other route 2 (two) times daily.  02/15/18   Burchette, Alinda Sierras, MD  amiodarone (PACERONE) 200 MG tablet Take one tablet by mouth Monday through Saturday.  Do NOT take on Sunday. Patient taking differently: Take 200 mg by mouth See admin instructions. Take one tablet (200 mg) by mouth Monday through Saturday.  Do NOT take on Sunday. 05/25/18   Evans Lance, MD  carvedilol (COREG) 12.5 MG tablet Take 1 tablet (12.5 mg total) by mouth 2 (two) times daily. NEED FOLLOW UP APPT 02/10/19   Larey Dresser, MD  diphenhydramine-acetaminophen (TYLENOL PM) 25-500 MG TABS tablet Take 1 tablet at bedtime as needed by mouth (for sleep).     [provider]  furosemide (LASIX) 20 MG tablet Take 1 tablet (20 mg total) by mouth 2 (two) times daily. 05/17/18   Bensimhon, Shaune Pascal, MD  HAWTHORN PO Take 1 capsule by mouth 2 (two) times daily.    [provider]  losartan (COZAAR) 25 MG tablet Take 25 mg by mouth 2 (two) times daily. 02/10/19   [provider]  Multiple Vitamin (MULTIVITAMIN WITH MINERALS) TABS tablet Take 1 tablet by mouth daily. Centrum Silver Adult 50+    [provider]  Omega-3 Fatty Acids (FISH OIL) 1000 MG CAPS Take 1,000 mg by mouth 2 (two) times daily.     [provider]  OVER THE COUNTER MEDICATION Take 1 capsule by mouth 2 (two) times daily. Heart supplement from Dr. Salomon Fick Ward    [provider]  rivaroxaban (XARELTO) 20 MG TABS tablet Take 1 tablet (20 mg total) by mouth daily with supper. Patient taking differently: Take 20 mg by mouth daily with breakfast.  05/27/18   Festus Aloe, MD  rosuvastatin (CRESTOR) 10 MG tablet Take 1 tablet (10 mg total) by mouth daily. 04/01/18   Larey Dresser, MD  spironolactone (ALDACTONE) 25 MG tablet Take 1 tablet (25 mg total) by mouth every evening. 06/22/18   Larey Dresser, MD    Family History Family History  Problem  Relation Age of Onset  . Cancer Mother 41       unknown CA  . Heart disease Father   . Alcohol abuse Father   . Heart attack Father 9  . Heart attack Brother     Social History Social History   Tobacco Use  . Smoking status: Former Smoker    Packs/day: 0.30    Years: 20.00    Pack years: 6.00    Types: Cigarettes    Last attempt to quit: 02/23/1989    Years since quitting: 29.9  . Smokeless tobacco: Never Used  Substance Use Topics  . Alcohol use: No  . Drug use: No     Allergies   Entresto [sacubitril-valsartan]; Crestor [rosuvastatin calcium]; Lipitor [atorvastatin]; Plavix [clopidogrel]; and Ancef [cefazolin]   Review of Systems Review of Systems  Constitutional: Negative for activity change, appetite change, chills, fever and unexpected weight change.  HENT: Negative for congestion, rhinorrhea and sore throat.   Eyes: Negative for visual disturbance.  Respiratory: Negative for cough and shortness of breath.   Cardiovascular: Negative for chest pain.  Gastrointestinal: Positive for abdominal pain. Negative for constipation, diarrhea, nausea and vomiting.  Endocrine: Negative for polydipsia, polyphagia and polyuria.  Genitourinary: Positive for difficulty urinating and hematuria. Negative for dysuria, flank pain and frequency.  Musculoskeletal:  Negative for back pain.  Skin: Negative for rash and wound.  Hematological: Bruises/bleeds easily (Pt is on Xarelto.).  Psychiatric/Behavioral: The patient is not nervous/anxious.     Physical Exam Updated Vital Signs BP 134/66   Pulse 88   Temp 98.5 F (36.9 C) (Oral)   Resp (!) 22   Ht 5\' 8"  (1.727 m)   Wt 101.6 kg   SpO2 (!) 87%   BMI 34.06 kg/m   Vitals:   02/13/19 1222 02/13/19 1230 02/13/19 1245 02/13/19 1300  BP:  (!) 156/94 127/72 134/66  Pulse:  97 91 88  Resp:      Temp:      TempSrc:      SpO2:  95% 96% (!) 87%  Weight: 101.6 kg     Height: 5\' 8"  (1.727 m)      Physical Exam Vitals signs and nursing note reviewed. Exam conducted with a chaperone present.  Constitutional:      General: He is not in acute distress.    Appearance: He is well-developed. He is not diaphoretic.  HENT:     Head: Normocephalic and atraumatic.     Mouth/Throat:     Mouth: Mucous membranes are moist.     Pharynx: No posterior oropharyngeal erythema.  Neck:     Musculoskeletal: Normal range of motion and neck supple.  Cardiovascular:     Rate and Rhythm: Normal rate and regular rhythm.     Heart sounds: Normal heart sounds. No murmur. No friction rub. No gallop.   Pulmonary:     Effort: Pulmonary effort is normal. No respiratory distress.     Breath sounds: Normal breath sounds. No wheezing or rales.  Abdominal:     General: Abdomen is protuberant. Bowel sounds are normal. There is no distension.     Palpations: Abdomen is soft.     Tenderness: There is no abdominal tenderness. There is no right CVA tenderness, left CVA tenderness or guarding.  Genitourinary:    Penis: Normal. No erythema, tenderness or swelling.      Comments: Foley catheter in place. Musculoskeletal: Normal range of motion.  Skin:    Findings: No erythema or rash.  Neurological:     Mental Status: He is alert and oriented to person, place, and time.     ED Treatments / Results  Labs (all labs ordered  are listed, but only abnormal results are displayed) Labs Reviewed - No data to display  EKG None  Radiology Ct Renal Stone Study  Result Date: 02/12/2019 CLINICAL DATA:  Suprapubic and right flank pain. Blood clots with urination for a month. EXAM: CT ABDOMEN AND PELVIS WITHOUT CONTRAST TECHNIQUE: Multidetector CT imaging of the abdomen and pelvis was performed following the standard protocol without IV contrast. COMPARISON:  08/24/2017 FINDINGS: Lower chest: Atelectasis or consolidation in the lung bases, greater on the left. Small amount of fluid in the left major fissure. Mild cardiac enlargement. Coronary artery calcifications. Hepatobiliary: Cholelithiasis. No inflammatory changes in the gallbladder wall. No bile duct dilatation. No focal liver lesions. Pancreas: Unremarkable. No pancreatic ductal dilatation or surrounding inflammatory changes. Spleen: Normal in size without focal abnormality. Adrenals/Urinary Tract: No adrenal gland nodules. Cyst on the lower pole right kidney without change. No hydronephrosis or hydroureter. No renal, ureteral, or bladder stones. Bladder is decompressed with a Foley catheter in place, limiting evaluation. Stomach/Bowel: Stomach, small bowel, and colon are mostly decompressed with scattered stool in the colon. No wall thickening or inflammatory changes. Appendix is normal. Vascular/Lymphatic: Aortic atherosclerosis. No enlarged abdominal or pelvic lymph nodes. Reproductive: Prostate is atrophic with seed implants in place consistent with prior radiation therapy for prostate cancer. Other: No free air or free fluid in the abdomen. Postoperative changes consistent with right hernia repair. Musculoskeletal: Degenerative changes in the spine. No destructive or sclerotic bone lesions identified. IMPRESSION: 1. Atelectasis or consolidation in the lung bases, greater on the left. 2. Cholelithiasis without evidence of cholecystitis. 3. No renal or ureteral stone or  obstruction. Bladder decompressed with Foley catheter. No cause of hematuria identified. 4. Prostate seed implants. Aortic Atherosclerosis (ICD10-I70.0). Electronically Signed   By: Lucienne Capers M.D.   On: 02/12/2019 20:59    Procedures Procedures (including critical care time)  Medications Ordered in ED Medications - No data to display   Initial Impression / Assessment and Plan / ED Course  I have reviewed the triage vital signs and the nursing notes.  Pertinent labs & imaging results that were available during my care of the patient were reviewed by me and considered in my medical decision making (see chart for details).       Patient presents with urinary retention. Bladder scan reveals 570cc. Foley catheter was flushed and a small blood clot was obstructing flow of urine. Urine output was 1000cc. Symptoms improved while in the ER. UA and urine culture ordered yesterday. CT stone study did not reveal a cause of hematuria. Advised patient to follow up with urology regarding hematuria and urinary retention. Discussed return precautions with patient. Patient and wife state they understand and agree with plan.  Findings and plan of care discussed with supervising physician Dr. Billy Fischer.  Final Clinical Impressions(s) / ED Diagnoses   Final diagnoses:  Urinary retention    ED Discharge Orders    None         Arville Lime, [place] 02/13/19 1351    Gareth Morgan, MD 02/14/19 1441

## 2019-02-13 NOTE — Discharge Instructions (Addendum)
You have been seen today for urinary retention. Please read and follow all provided instructions.   1. Medications: usual home medications 2. Treatment: rest, drink plenty of fluids 3. Follow Up: Please follow up with urology. Please follow up with your primary doctor in 7 days for discussion of your diagnoses and further evaluation after today's visit; if you do not have a primary care doctor use the resource guide provided to find one; Please return to the ER for any new or worsening symptoms. Please obtain all of your results from medical records or have your doctors office obtain the results - share them with your doctor - you should be seen at your doctors office. Call today to arrange your follow up.   Take medications as prescribed. Please review all of the medicines and only take them if you do not have an allergy to them. Return to the emergency room for worsening condition or new concerning symptoms. Follow up with your regular doctor. If you don't have a regular doctor use one of the numbers below to establish a primary care doctor.  Please be aware that if you are taking birth control pills, taking other prescriptions, ESPECIALLY ANTIBIOTICS may make the birth control ineffective - if this is the case, either do not engage in sexual activity or use alternative methods of birth control such as condoms until you have finished the medicine and your family doctor says it is OK to restart them. If you are on a blood thinner such as COUMADIN, be aware that any other medicine that you take may cause the coumadin to either work too much, or not enough - you should have your coumadin level rechecked in next 7 days if this is the case.  ?  It is also a possibility that you have an allergic reaction to any of the medicines that you have been prescribed - Everybody reacts differently to medications and while MOST people have no trouble with most medicines, you may have a reaction such as nausea, vomiting,  rash, swelling, shortness of breath. If this is the case, please stop taking the medicine immediately and contact your physician.  ?  You should return to the ER if you develop severe or worsening symptoms.   Emergency Department Resource Guide 1) Find a Doctor and Pay Out of Pocket Although you won't have to find out who is covered by your insurance plan, it is a good idea to ask around and get recommendations. You will then need to call the office and see if the doctor you have chosen will accept you as a new patient and what types of options they offer for patients who are self-pay. Some doctors offer discounts or will set up payment plans for their patients who do not have insurance, but you will need to ask so you aren't surprised when you get to your appointment.  2) Contact Your Local Health Department Not all health departments have doctors that can see patients for sick visits, but many do, so it is worth a call to see if yours does. If you don't know where your local health department is, you can check in your phone book. The CDC also has a tool to help you locate your state's health department, and many state websites also have listings of all of their local health departments.  3) Find a [facility] If your illness is not likely to be very severe or complicated, you may want to try a walk in clinic. These  are popping up all over the country in pharmacies, drugstores, and shopping centers. They're usually staffed by nurse practitioners or physician assistants that have been trained to treat common illnesses and complaints. They're usually fairly quick and inexpensive. However, if you have serious medical issues or chronic medical problems, these are probably not your best option.  No Primary Care Doctor: Call Health Connect at  [phone] - they can help you locate a primary care doctor that  accepts your insurance, provides certain services, etc. Physician Referral [phone]  Emergency Department Resource Guide 1) Find a Doctor and Pay Out of Pocket Although you won't have to find out who is covered by your insurance plan, it is a good idea to ask around and get recommendations. You will then need to call the office and see if the doctor you have chosen will accept you as a new patient and what types of options they offer for patients who are self-pay. Some doctors offer discounts or will set up payment plans for their patients who do not have insurance, but you will need to ask so you aren't surprised when you get to your appointment.  2) Contact Your Local Health Department Not all health departments have doctors that can see patients for sick visits, but many do, so it is worth a call to see if yours does. If you don't know where your local health department is, you can check in your phone book. The CDC also has a tool to help you locate your state's health department, and many state websites also have listings of all of their local health departments.  3) Find a [facility] If your illness is not likely to be very severe or complicated, you may want to try a walk in clinic. These are popping up all over the country in pharmacies, drugstores, and shopping centers. They're usually staffed by nurse practitioners or physician assistants that have been trained to treat common illnesses and complaints. They're usually fairly quick and inexpensive. However, if you have serious medical issues or chronic medical problems, these are probably not your best option.  No Primary Care Doctor: Call Health Connect at  [phone] - they can help you locate a primary care doctor that  accepts your insurance, provides certain services, etc. Physician Referral Service- [phone]  Chronic Pain Problems: Organization         Address  Phone   Notes  [facility]  [phone] Patients need to be referred by their primary care doctor.    Medication Assistance: Organization         Address  Phone   Notes  [facility] Medication [facility] [address] [place], [place] [address] [phone] --Must be a resident of [facility] -- Must have NO insurance coverage whatsoever (no Medicaid/ Medicare, etc.) -- The pt. MUST have a primary care doctor that directs their care regularly and follows them in the community   [facility]  [phone]   [employer]  [phone]    Agencies that provide inexpensive medical care: Organization         Address  Phone   Notes  [place]  [phone]   [facility]    [phone]   [facility],  [phone]   [place] [address] [place] [phone]   [facility]    (  [phone]   [facility]    [phone]   [facility]  [address], [place] Phone:  [phone], Fax:  [phone] Hours of Operation:  9 am - 6 pm, M-F.  Also accepts Medicaid/Medicare and self-pay.  [facility] for [place], [address], [place] Phone: [phone], Fax: [phone]. Hours of Operation:  8:30 am - 5:30 pm, M-F.  Also accepts Medicaid and self-pay.  [facility] [address], [place] Phone: [phone]   [place], [place], [place] [phone], Ext. [phone] Mondays & Thursdays: 7-9 AM.  First 15 patients are seen on a first come, first serve basis.    [place] Providers:  Organization         Address  Phone   Notes  [facility] [address], [place] [phone] Also accepts self-pay patients.  [facility] [address], [place]  [phone]   [place], [address], [place] [phone]   [facility] [address] [place] [phone]   Lucianne Lei [address], [place]   [phone] Only accepts [place] [facility] [place] patients after they have their name applied to their card.   Self-Pay (no insurance) in [facility]:  Organization         Address  Phone   Notes  Sickle Cell Patients, [facility] [place] [phone]   [facility] [place] [phone]   [facility] [place]  [place], [place], [place] [phone]   [facility]/Dr. Osei-Bonsu  [address], [place] or [address], [place] [phone] Phone number for both [place] and [place] locations is the same.  [facility] and [facility] [address], [place] [phone]   [facility] [address] [place] or [address] [phone]   [facility] [address] [place] [phone], phone; [phone], fax Sees patients 1st and 3rd Saturday of every month.  Must not qualify for public or private insurance (i.e. Medicaid, Medicare, [place] Health Choice, Veterans' Benefits)  Household income should be no more than 200% of the poverty level The clinic cannot treat you if you are pregnant or think you are pregnant  Sexually transmitted diseases are not treated at the clinic.

## 2019-02-13 NOTE — ED Triage Notes (Addendum)
PT returns today because the foley catheter is not draining. Pt reports catheter was placed on SAT . Bladder scan on arrrival to room showed 58ml  Present in bladder.

## 2019-02-14 ENCOUNTER — Telehealth (HOSPITAL_BASED_OUTPATIENT_CLINIC_OR_DEPARTMENT_OTHER): Payer: Self-pay | Admitting: Emergency Medicine

## 2019-02-14 DIAGNOSIS — R3914 Feeling of incomplete bladder emptying: Secondary | ICD-10-CM | POA: Diagnosis not present

## 2019-02-14 DIAGNOSIS — R310 Gross hematuria: Secondary | ICD-10-CM | POA: Diagnosis not present

## 2019-02-15 DIAGNOSIS — R3914 Feeling of incomplete bladder emptying: Secondary | ICD-10-CM | POA: Diagnosis not present

## 2019-02-16 LAB — CULTURE, BLOOD (ROUTINE X 2): Special Requests: ADEQUATE

## 2019-02-17 DIAGNOSIS — R310 Gross hematuria: Secondary | ICD-10-CM | POA: Diagnosis not present

## 2019-02-17 DIAGNOSIS — R3914 Feeling of incomplete bladder emptying: Secondary | ICD-10-CM | POA: Diagnosis not present

## 2019-02-17 LAB — CULTURE, BLOOD (ROUTINE X 2)
Culture: NO GROWTH
Special Requests: ADEQUATE

## 2019-02-18 DIAGNOSIS — R3914 Feeling of incomplete bladder emptying: Secondary | ICD-10-CM | POA: Diagnosis not present

## 2019-03-01 ENCOUNTER — Emergency Department (HOSPITAL_BASED_OUTPATIENT_CLINIC_OR_DEPARTMENT_OTHER): Payer: Medicare Other

## 2019-03-01 ENCOUNTER — Other Ambulatory Visit: Payer: Self-pay

## 2019-03-01 ENCOUNTER — Emergency Department (HOSPITAL_BASED_OUTPATIENT_CLINIC_OR_DEPARTMENT_OTHER)
Admission: EM | Admit: 2019-03-01 | Discharge: 2019-03-01 | Disposition: A | Discharge status: Home / Self Care | Payer: Medicare Other | Attending: Emergency Medicine | Admitting: Emergency Medicine

## 2019-03-01 ENCOUNTER — Encounter (HOSPITAL_BASED_OUTPATIENT_CLINIC_OR_DEPARTMENT_OTHER): Payer: Self-pay | Admitting: Emergency Medicine

## 2019-03-01 DIAGNOSIS — N50819 Testicular pain, unspecified: Secondary | ICD-10-CM | POA: Diagnosis present

## 2019-03-01 DIAGNOSIS — I5022 Chronic systolic (congestive) heart failure: Secondary | ICD-10-CM | POA: Insufficient documentation

## 2019-03-01 DIAGNOSIS — N451 Epididymitis: Secondary | ICD-10-CM | POA: Diagnosis not present

## 2019-03-01 DIAGNOSIS — E1122 Type 2 diabetes mellitus with diabetic chronic kidney disease: Secondary | ICD-10-CM | POA: Insufficient documentation

## 2019-03-01 DIAGNOSIS — I130 Hypertensive heart and chronic kidney disease with heart failure and stage 1 through stage 4 chronic kidney disease, or unspecified chronic kidney disease: Secondary | ICD-10-CM | POA: Insufficient documentation

## 2019-03-01 DIAGNOSIS — Z79899 Other long term (current) drug therapy: Secondary | ICD-10-CM | POA: Insufficient documentation

## 2019-03-01 DIAGNOSIS — I2510 Atherosclerotic heart disease of native coronary artery without angina pectoris: Secondary | ICD-10-CM | POA: Diagnosis not present

## 2019-03-01 DIAGNOSIS — R1030 Lower abdominal pain, unspecified: Secondary | ICD-10-CM | POA: Insufficient documentation

## 2019-03-01 DIAGNOSIS — N183 Chronic kidney disease, stage 3 (moderate): Secondary | ICD-10-CM | POA: Diagnosis not present

## 2019-03-01 DIAGNOSIS — Z95810 Presence of automatic (implantable) cardiac defibrillator: Secondary | ICD-10-CM | POA: Insufficient documentation

## 2019-03-01 DIAGNOSIS — Z87891 Personal history of nicotine dependence: Secondary | ICD-10-CM | POA: Diagnosis not present

## 2019-03-01 DIAGNOSIS — K8020 Calculus of gallbladder without cholecystitis without obstruction: Secondary | ICD-10-CM | POA: Diagnosis not present

## 2019-03-01 DIAGNOSIS — Z7901 Long term (current) use of anticoagulants: Secondary | ICD-10-CM | POA: Insufficient documentation

## 2019-03-01 DIAGNOSIS — R109 Unspecified abdominal pain: Secondary | ICD-10-CM | POA: Diagnosis not present

## 2019-03-01 DIAGNOSIS — R509 Fever, unspecified: Secondary | ICD-10-CM | POA: Insufficient documentation

## 2019-03-01 LAB — URINALYSIS, MICROSCOPIC (REFLEX)

## 2019-03-01 LAB — COMPREHENSIVE METABOLIC PANEL
ALT: 17 U/L (ref 0–44)
AST: 16 U/L (ref 15–41)
Albumin: 3.4 g/dL — ABNORMAL LOW (ref 3.5–5.0)
Alkaline Phosphatase: 88 U/L (ref 38–126)
Anion gap: 10 (ref 5–15)
BUN: 15 mg/dL (ref 8–23)
CO2: 21 mmol/L — ABNORMAL LOW (ref 22–32)
Calcium: 9.2 mg/dL (ref 8.9–10.3)
Chloride: 101 mmol/L (ref 98–111)
Creatinine, Ser: 1.04 mg/dL (ref 0.61–1.24)
GFR calc Af Amer: >60 mL/min (ref >60)
GFR calc non Af Amer: >60 mL/min (ref >60)
Glucose, Bld: 299 mg/dL — ABNORMAL HIGH (ref 70–99)
Potassium: 4.5 mmol/L (ref 3.5–5.1)
Sodium: 132 mmol/L — ABNORMAL LOW (ref 135–145)
Total Bilirubin: 1.4 mg/dL — ABNORMAL HIGH (ref 0.3–1.2)
Total Protein: 6.9 g/dL (ref 6.5–8.1)

## 2019-03-01 LAB — URINALYSIS, ROUTINE W REFLEX MICROSCOPIC
Bilirubin Urine: NEGATIVE
Glucose, UA: >=500 mg/dL — AB
Ketones, ur: 15 mg/dL — AB
Leukocytes,Ua: NEGATIVE
Nitrite: NEGATIVE
Protein, ur: 30 mg/dL — AB
Specific Gravity, Urine: 1.02 (ref 1.005–1.030)
pH: 6 (ref 5.0–8.0)

## 2019-03-01 LAB — CBC WITH DIFFERENTIAL/PLATELET
Abs Immature Granulocytes: 0.12 10*3/uL — ABNORMAL HIGH (ref 0.00–0.07)
Basophils Absolute: 0 10*3/uL (ref 0.0–0.1)
Basophils Relative: 0 %
Eosinophils Absolute: 0 10*3/uL (ref 0.0–0.5)
Eosinophils Relative: 0 %
HCT: 46.9 % (ref 39.0–52.0)
Hemoglobin: 14.7 g/dL (ref 13.0–17.0)
Immature Granulocytes: 1 %
Lymphocytes Relative: 4 %
Lymphs Abs: 0.9 10*3/uL (ref 0.7–4.0)
MCH: 27.4 pg (ref 26.0–34.0)
MCHC: 31.3 g/dL (ref 30.0–36.0)
MCV: 87.5 fL (ref 80.0–100.0)
Monocytes Absolute: 1.4 10*3/uL — ABNORMAL HIGH (ref 0.1–1.0)
Monocytes Relative: 7 %
Neutro Abs: 17.4 10*3/uL — ABNORMAL HIGH (ref 1.7–7.7)
Neutrophils Relative %: 88 %
Platelets: 171 10*3/uL (ref 150–400)
RBC: 5.36 MIL/uL (ref 4.22–5.81)
RDW: 12.7 % (ref 11.5–15.5)
WBC: 19.9 10*3/uL — ABNORMAL HIGH (ref 4.0–10.5)
nRBC: 0 % (ref 0.0–0.2)

## 2019-03-01 LAB — LACTIC ACID, PLASMA: Lactic Acid, Venous: 1.6 mmol/L (ref 0.5–1.9)

## 2019-03-01 MED ORDER — LEVOFLOXACIN 500 MG PO TABS
500.0000 mg | ORAL_TABLET | Freq: Once | ORAL | Status: AC
  Administered 2019-03-01: 500 mg via ORAL
  Filled 2019-03-01: qty 1

## 2019-03-01 MED ORDER — ACETAMINOPHEN 500 MG PO TABS
1000.0000 mg | ORAL_TABLET | Freq: Once | ORAL | Status: AC
  Administered 2019-03-01: 1000 mg via ORAL

## 2019-03-01 MED ORDER — ACETAMINOPHEN 500 MG PO TABS
ORAL_TABLET | ORAL | Status: AC
  Filled 2019-03-01: qty 2

## 2019-03-01 MED ORDER — IOHEXOL 300 MG/ML  SOLN
100.0000 mL | Freq: Once | INTRAMUSCULAR | Status: AC | PRN
  Administered 2019-03-01: 100 mL via INTRAVENOUS

## 2019-03-01 MED ORDER — LEVOFLOXACIN 500 MG PO TABS: 500.0000 mg | ORAL_TABLET | Freq: Every day | ORAL | 0 refills | Status: DC

## 2019-03-01 NOTE — Discharge Instructions (Addendum)
Return here as needed.  Follow-up with your urologist. °

## 2019-03-01 NOTE — ED Triage Notes (Signed)
Per family pt is having fever since this morning last time check at home at 6 pm 102, with increase fatigue, denies any cough is having some lower abd pain since last week

## 2019-03-01 NOTE — ED Notes (Signed)
Provider at bedside, will return to draw labs

## 2019-03-01 NOTE — ED Notes (Signed)
Patient transported to Ultrasound 

## 2019-03-01 NOTE — ED Provider Notes (Signed)
[facility] Provider Note   CSN: [id number] Arrival date & time: 03/01/19  2038    History   Chief Complaint Chief Complaint  Patient presents with  . 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 male.     HPI Patient presents to the emergency department with fever and lower abdominal and testicle pain that started today.  Patient states that nothing seems to make the condition better or worse.  Patient states that he did have some fatigue as well today.  Patient states he has had no upper respiratory symptoms.  The patient denies chest pain, shortness of breath, headache,blurred vision, neck pain, cough, weakness, numbness, dizziness, anorexia, edema, nausea, vomiting, diarrhea, rash, back pain, dysuria, hematemesis, bloody stool, near syncope, or syncope. Past Medical History:  Diagnosis Date  . AICD (automatic cardioverter/defibrillator) present   . Arthritis   . Cancer [facility])    bladder and prostate  . Cellulitis of left leg    a. 05/3784 complicated by septic shock  . Chronic systolic CHF (congestive heart failure) ([place])   . CKD (chronic kidney disease), stage III ([place])    pt denies  . Colon polyps   . CORONARY ATHEROSCLEROSIS NATIVE CORONARY ARTERY cardiologist-  dr Aundra Dubin   a. 01/2011 Cath/PCI: LM nl, LAD 40-50p, D1 80-small, LCX 95-small, RI 90, RCA 100, EF 20%;  b. 01/2011 Card MRI - No transmural scar;  c. 01/2011 PCI RCA->5 Promus DES, RI->3.0x16 Promus DES; d. Cath 01/13/13 patent LAD & Ramus, diffuse LCx dz, RCA mult overlapping stents w/ 95% osital stenosis, EF 20%, s/p DES to ostial/prox RCA 01/24/13   . Diabetes mellitus, type 2 ([place])    type 2  . GERD (gastroesophageal reflux disease)   . Hematoma of leg    a. left leg hematoma 03/2012 in the setting of asa/effient  . Herpes zoster ophthalmicus   . History of non-ST elevation myocardial infarction (NSTEMI) 06/2012  . History of prostate cancer    dx 2009---  s/p radioactive seed implants  11-24-2008  . HYPERLIPIDEMIA    intolerant to Lipitor (myalgias)  . HYPERTENSION   . Ischemic cardiomyopathy    a. 01/2012 Echo EF 45%, mild LVH; b. YI50%, grade 1 diastolic dysfunction, diffuse hypokinesis, inferoposterior akinesis   . Myocardial infarction ([place])    x2  . Noncompliance   . Obesity   . OSA (obstructive sleep apnea)    dx 2012  severe osa  no cpap just uses nose strips  . PAF (paroxysmal atrial fibrillation) ([place])    first dx 01-16-2017  . Polymyalgia rheumatica ([place])   . S/P drug eluting coronary stent placement    total 6 DES    Patient Active Problem List   Diagnosis Date Noted  . Bladder cancer ([place]) 11/09/2017  . Low back pain radiating to left lower extremity 09/21/2017  . Foot drop, left 08/24/2017  . Diabetes mellitus ([place])   . ICD (implantable cardioverter-defibrillator) discharge 07/05/2017  . Syncope and collapse   . Ventricular tachycardia ([place]) 06/18/2017  . Paroxysmal atrial fibrillation ([place]) 06/10/2017  . Hypoxemia   . VT (ventricular tachycardia) ([place])   . Cardiac arrest ([place]) 05/22/2017  . Defibrillator discharge 02/11/2017  . Atrial fibrillation with rapid ventricular response ([place]) 01/16/2017  . Coronary stent occlusion 11/18/2016  . Type 2 diabetes mellitus, uncontrolled ([place]) 09/30/2016  . CKD (chronic kidney disease) stage 3, GFR 30-59 ml/min (HCC) 09/30/2016  . CAD (coronary artery disease) 11/20/2015  .  Leucocytoclastic vasculitis ([place]) 06/25/2012  . Staphylococcus aureus bacteremia with sepsis ([place]) 06/18/2012  . NSTEMI, initial episode of care ([place]) 06/16/2012  . Cellulitis and abscess of lower leg 06/14/2012    Class: Acute  . Healthcare-associated pneumonia 06/14/2012    Class: Acute  . Hyperglycemia 06/14/2012    Class: Acute  . Leg swelling 04/22/2012  . Urethral stricture 04/15/2012  . Gross hematuria 03/22/2012  . Hematochezia 03/22/2012  . Obesity 01/08/2012  . ED (erectile dysfunction) of organic origin 09/11/2011   . OSA (obstructive sleep apnea) 09/02/2011  . Systolic CHF, chronic ([place]) 06/10/2011  . HEMATOCHEZIA 02/10/2011  . BURSITIS, LEFT HIP 02/10/2011  . CORONARY ATHEROSCLEROSIS NATIVE CORONARY ARTERY 01/27/2011  . CARDIOMYOPATHY 01/21/2011  . CHEST PAIN UNSPECIFIED 01/20/2011  . DYSPNEA 01/16/2011  . ABDOMINAL PAIN, UNSPECIFIED SITE 01/13/2011  . ECZEMA 01/28/2010  . BRONCHITIS, ACUTE WITH MILD BRONCHOSPASM 12/21/2009  . HERPES ZOSTER OPHTHALMICUS 07/19/2009  . ADENOCARCINOMA, PROSTATE 06/20/2009  . Secondary DM with peripheral vascular disease, uncontrolled ([employer]) 06/20/2009  . Hyperlipidemia 06/20/2009  . Essential hypertension 06/20/2009  . HYPERTROPHY PROSTATE W/UR OBST & OTH LUTS 06/20/2009    Past Surgical History:  Procedure Laterality Date  . CARDIAC CATHETERIZATION  01/24/2013  . CARDIAC CATHETERIZATION N/A 11/14/2016   Procedure: Right/Left Heart Cath and Coronary Angiography;  Surgeon: Larey Dresser, MD;  Location: [facility];  Service: Cardiovascular;  Laterality: N/A;  . CARDIAC CATHETERIZATION N/A 11/17/2016   Procedure: Coronary Stent Intervention w/Impella;  Surgeon: Peter M Martinique, MD;  Location: [facility];  Service: Cardiovascular;  Laterality: N/A;  . CARDIAC CATHETERIZATION N/A 11/17/2016   Procedure: Coronary Atherectomy;  Surgeon: Peter M Martinique, MD;  Location: [facility];  Service: Cardiovascular;  Laterality: N/A;  . CORONARY ANGIOPLASTY WITH STENT PLACEMENT  01-30-2011   dr Aundra Dubin   PTCA and multiple overlapping DEStenting to RCA and 1 DES to RI   . CORONARY BALLOON ANGIOPLASTY N/A 07/06/2017   Procedure: Coronary Balloon Angioplasty;  Surgeon: Sherren Mocha, MD;  Location: [facility];  Service: Cardiovascular;  Laterality: N/A;  . CYSTOSCOPY WITH FULGERATION N/A 05/25/2018   Procedure: CYSTOSCOPY WITH FULGERATION/BLADDER BIOPSY , 15 mm lesion;  Surgeon: Festus Aloe, MD;  [facility];  Service: Urology;  Laterality: N/A;   . I&D EXTREMITY  06/15/2012   Procedure: IRRIGATION AND DEBRIDEMENT EXTREMITY;  Surgeon: Newt Minion, MD;  Location: [facility];  Service: Orthopedics;  Laterality: Left;  I&D Left Posterior Knee  . I&D EXTREMITY  06/30/2012   Procedure: IRRIGATION AND DEBRIDEMENT EXTREMITY;  Surgeon: Newt Minion, MD;  Location: [facility];  Service: Orthopedics;  Laterality: Left;  Left Leg Irrigation and Debridement and placement of Wound VAC and application of  A-cell  . I&D EXTREMITY  07/20/2012   Procedure: IRRIGATION AND DEBRIDEMENT EXTREMITY;  Surgeon: Newt Minion, MD;  Location: [facility];  Service: Orthopedics;  Laterality: Left;  Irrigation and Debridement Left Leg and Place antibiotic beads   . ICD IMPLANT N/A 02/12/2017   Procedure: ICD Implant;  Surgeon: Evans Lance, MD;  Location: [facility];  Service: Cardiovascular;  Laterality: N/A;  . LAPAROSCOPIC INGUINAL HERNIA REPAIR Right 09-11-2008   dr Barry Dienes  [facility]  . LEAD REVISION/REPAIR N/A 05/26/2017   Procedure: Lead Revision/Repair;  Surgeon: Evans Lance, MD;  Location: [facility];  Service: Cardiovascular;  Laterality: N/A;  . LEFT HEART CATH AND CORONARY ANGIOGRAPHY N/A 07/06/2017   Procedure: Left  Heart Cath and Coronary Angiography;  Surgeon: Larey Dresser, MD;  Location: [facility];  Service: Cardiovascular;  Laterality: N/A;  . PERCUTANEOUS CORONARY STENT INTERVENTION (PCI-S) N/A 01/24/2013   Procedure: PERCUTANEOUS CORONARY STENT INTERVENTION (PCI-S);  Surgeon: Wellington Hampshire, MD;  Location: [facility] C[facility];  Service: Cardiovascular;  Laterality: N/A;  . RADIOACTIVE PROSTATE SEED IMPLANTS  11-24-2008   dr Tresa Endo  [facility]  . TRANSURETHRAL RESECTION OF BLADDER TUMOR N/A 10/30/2017   Procedure: TRANSURETHRAL RESECTION OF BLADDER TUMOR (TURBT)/ INSTILLATION OF EPIRUBICIN;  Surgeon: Festus Aloe, MD;  [facility];  Service: Urology;  Laterality: N/A;  . ULTRASOUND GUIDANCE FOR VASCULAR ACCESS  11/14/2016   Procedure:  Ultrasound Guidance For Vascular Access;  Surgeon: Larey Dresser, MD;  Location: [facility];  Service: Cardiovascular;;  . Stephanie Coup ABLATION N/A 06/19/2017   Procedure: Stephanie Coup Ablation;  Surgeon: Evans Lance, MD;  Location: [facility];  Service: Cardiovascular;  Laterality: N/A;        Home Medications    Prior to Admission medications   Medication Sig Start Date End Date Taking? Authorizing Provider  ACCU-CHEK AVIVA PLUS test strip CHECK BLOOD SUGAR ONCE TO TWICE DAILY. Patient taking differently: 1 each by Other route 2 (two) times daily.  02/15/18   Burchette, Alinda Sierras, MD  amiodarone (PACERONE) 200 MG tablet Take one tablet by mouth Monday through Saturday.  Do NOT take on Sunday. Patient taking differently: Take 200 mg by mouth See admin instructions. Take one tablet (200 mg) by mouth Monday through Saturday.  Do NOT take on Sunday. 05/25/18   Evans Lance, MD  carvedilol (COREG) 12.5 MG tablet Take 1 tablet (12.5 mg total) by mouth 2 (two) times daily. NEED FOLLOW UP APPT 02/10/19   Larey Dresser, MD  diphenhydramine-acetaminophen (TYLENOL PM) 25-500 MG TABS tablet Take 1 tablet at bedtime as needed by mouth (for sleep).     [provider]  furosemide (LASIX) 20 MG tablet Take 1 tablet (20 mg total) by mouth 2 (two) times daily. 05/17/18   Bensimhon, Shaune Pascal, MD  HAWTHORN PO Take 1 capsule by mouth 2 (two) times daily.    [provider]  losartan (COZAAR) 25 MG tablet Take 25 mg by mouth 2 (two) times daily. 02/10/19   [provider]  Multiple Vitamin (MULTIVITAMIN WITH MINERALS) TABS tablet Take 1 tablet by mouth daily. Centrum Silver Adult 50+    [provider]  Omega-3 Fatty Acids (FISH OIL) 1000 MG CAPS Take 1,000 mg by mouth 2 (two) times daily.     [provider]  OVER THE COUNTER MEDICATION Take 1 capsule by mouth 2 (two) times daily. Heart supplement from Dr. Salomon Fick [place]    [provider]  rivaroxaban  (XARELTO) 20 MG TABS tablet Take 1 tablet (20 mg total) by mouth daily with supper. Patient taking differently: Take 20 mg by mouth daily with breakfast.  05/27/18   Festus Aloe, MD  rosuvastatin (CRESTOR) 10 MG tablet Take 1 tablet (10 mg total) by mouth daily. 04/01/18   Larey Dresser, MD  spironolactone (ALDACTONE) 25 MG tablet Take 1 tablet (25 mg total) by mouth every evening. 06/22/18   Larey Dresser, MD    Family History Family History  Problem Relation Age of Onset  . Cancer Mother 58       unknown CA  . Heart disease Father   . Alcohol abuse Father   .  Heart attack Father 77  . Heart attack Brother     Social History Social History   Tobacco Use  . Smoking status: Former Smoker    Packs/day: 0.30    Years: 20.00    Pack years: 6.00    Types: Cigarettes    Last attempt to quit: 02/23/1989    Years since quitting: 30.0  . Smokeless tobacco: Never Used  Substance Use Topics  . Alcohol use: No  . Drug use: No     Allergies   Entresto [sacubitril-valsartan]; Crestor [rosuvastatin calcium]; Lipitor [atorvastatin]; Plavix [clopidogrel]; and Ancef [cefazolin]   Review of Systems Review of Systems  All other systems negative except as documented in the HPI. All pertinent positives and negatives as reviewed in the HPI. Physical Exam Updated Vital Signs BP 125/78 (BP Location: Right Arm)   Pulse 100   Temp (!) 101.9 F (38.8 C) (Oral)   Resp 20   Ht 5\' 8"  (1.727 m)   Wt 101.6 kg   SpO2 92%   BMI 34.06 kg/m   Physical Exam Vitals signs and nursing note reviewed.  Constitutional:      General: He is not in acute distress.    Appearance: He is well-developed.  HENT:     Head: Normocephalic and atraumatic.  Eyes:     Pupils: Pupils are equal, round, and reactive to light.  Neck:     Musculoskeletal: Normal range of motion and neck supple.  Cardiovascular:     Rate and Rhythm: Normal rate and regular rhythm.     Heart sounds: Normal heart sounds.  No murmur. No friction rub. No gallop.   Pulmonary:     Effort: Pulmonary effort is normal. No respiratory distress.     Breath sounds: Normal breath sounds. No wheezing.  Abdominal:     General: Bowel sounds are normal. There is no distension.     Palpations: Abdomen is soft.     Tenderness: There is abdominal tenderness.  Genitourinary:    Penis: Normal.      Scrotum/Testes:        Left: Tenderness and swelling present.  Skin:    General: Skin is warm and dry.     Capillary Refill: Capillary refill takes less than 2 seconds.     Findings: No erythema or rash.  Neurological:     Mental Status: He is alert and oriented to person, place, and time.     Motor: No abnormal muscle tone.     Coordination: Coordination normal.  Psychiatric:        Behavior: Behavior normal.      ED Treatments / Results  Labs (all labs ordered are listed, but only abnormal results are displayed) Labs Reviewed  URINE CULTURE  COMPREHENSIVE METABOLIC PANEL  LACTIC ACID, PLASMA  LACTIC ACID, PLASMA  CBC WITH DIFFERENTIAL/PLATELET  URINALYSIS, ROUTINE W REFLEX MICROSCOPIC    EKG None  Radiology No results found.  Procedures Procedures (including critical care time)  Medications Ordered in ED Medications  acetaminophen (TYLENOL) tablet 1,000 mg (has no administration in time range)     Initial Impression / Assessment and Plan / ED Course  I have reviewed the triage vital signs and the nursing notes.  Pertinent labs & imaging results that were available during my care of the patient were reviewed by me and considered in my medical decision making (see chart for details).        Patient being treated for epididymitis that is less likely related to  his recent Foley catheter usage and subsequent removal.  The patient will be treated with Levaquin.  Told to return here as needed.  Patient is advised the plan and all questions were answered.  I did advise him he will need to see his  urologist as soon as possible.  Final Clinical Impressions(s) / ED Diagnoses   Final diagnoses:  None    ED Discharge Orders    None       Dalia Heading, PA-C 03/04/19 0524    Margette Fast, MD 03/04/19 [phone]

## 2019-03-04 LAB — URINE CULTURE: Culture: >=100000 — AB

## 2019-03-05 ENCOUNTER — Telehealth: Payer: Self-pay | Admitting: Emergency Medicine

## 2019-03-05 NOTE — Telephone Encounter (Signed)
Post ED Visit - Positive Culture Follow-up  Culture report reviewed by antimicrobial stewardship pharmacist: [place] Team []  Elenor Quinones, Pharm.D. []  Heide Guile, Pharm.D., BCPS AQ-ID []  Parks Neptune, Pharm.D., BCPS []  Alycia Rossetti, Pharm.D., BCPS []  [place], Pharm.D., BCPS, AAHIVP []  Legrand Como, Pharm.D., BCPS, AAHIVP [x]  Salome Arnt, PharmD, BCPS []  Johnnette Gourd, PharmD, BCPS []  Hughes Better, PharmD, BCPS []  Leeroy Cha, PharmD []  Laqueta Linden, PharmD, BCPS []  Albertina Parr, PharmD  [place] Team []  Leodis Sias, PharmD []  Lindell Spar, PharmD []  Royetta Asal, PharmD []  Graylin Shiver, Rph []  Rema Fendt) Glennon Mac, PharmD []  Arlyn Dunning, PharmD []  Netta Cedars, PharmD []  Dia Sitter, PharmD []  Leone Haven, PharmD []  Gretta Arab, PharmD []  Theodis Shove, PharmD []  Peggyann Juba, PharmD []  Reuel Boom, PharmD   Positive urine culture Treated with Levofloxacin, organism sensitive to the same and no further patient follow-up is required at this time.  [place] Sorayah Schrodt 03/05/2019, 11:11 AM

## 2019-03-06 ENCOUNTER — Other Ambulatory Visit: Payer: Self-pay

## 2019-03-06 ENCOUNTER — Emergency Department (HOSPITAL_COMMUNITY)
Admission: EM | Admit: 2019-03-06 | Discharge: 2019-03-06 | Disposition: A | Discharge status: Home / Self Care | Payer: Medicare Other | Attending: Emergency Medicine | Admitting: Emergency Medicine

## 2019-03-06 ENCOUNTER — Encounter (HOSPITAL_COMMUNITY): Payer: Self-pay | Admitting: Emergency Medicine

## 2019-03-06 DIAGNOSIS — E1122 Type 2 diabetes mellitus with diabetic chronic kidney disease: Secondary | ICD-10-CM | POA: Insufficient documentation

## 2019-03-06 DIAGNOSIS — I252 Old myocardial infarction: Secondary | ICD-10-CM | POA: Insufficient documentation

## 2019-03-06 DIAGNOSIS — R339 Retention of urine, unspecified: Secondary | ICD-10-CM | POA: Insufficient documentation

## 2019-03-06 DIAGNOSIS — I5022 Chronic systolic (congestive) heart failure: Secondary | ICD-10-CM | POA: Diagnosis not present

## 2019-03-06 DIAGNOSIS — I480 Paroxysmal atrial fibrillation: Secondary | ICD-10-CM | POA: Insufficient documentation

## 2019-03-06 DIAGNOSIS — N183 Chronic kidney disease, stage 3 (moderate): Secondary | ICD-10-CM | POA: Diagnosis not present

## 2019-03-06 DIAGNOSIS — I2510 Atherosclerotic heart disease of native coronary artery without angina pectoris: Secondary | ICD-10-CM | POA: Insufficient documentation

## 2019-03-06 DIAGNOSIS — Z87891 Personal history of nicotine dependence: Secondary | ICD-10-CM | POA: Insufficient documentation

## 2019-03-06 DIAGNOSIS — Z79899 Other long term (current) drug therapy: Secondary | ICD-10-CM | POA: Diagnosis not present

## 2019-03-06 DIAGNOSIS — I130 Hypertensive heart and chronic kidney disease with heart failure and stage 1 through stage 4 chronic kidney disease, or unspecified chronic kidney disease: Secondary | ICD-10-CM | POA: Diagnosis not present

## 2019-03-06 DIAGNOSIS — Z95810 Presence of automatic (implantable) cardiac defibrillator: Secondary | ICD-10-CM | POA: Diagnosis not present

## 2019-03-06 DIAGNOSIS — R1030 Lower abdominal pain, unspecified: Secondary | ICD-10-CM | POA: Diagnosis not present

## 2019-03-06 LAB — URINALYSIS, ROUTINE W REFLEX MICROSCOPIC
Bacteria, UA: NONE SEEN
Bilirubin Urine: NEGATIVE
Glucose, UA: >=500 mg/dL — AB
Ketones, ur: NEGATIVE mg/dL
LEUKOCYTE UA: NEGATIVE
Nitrite: NEGATIVE
Protein, ur: NEGATIVE mg/dL
RBC / HPF: >50 RBC/hpf — ABNORMAL HIGH (ref 0–5)
Specific Gravity, Urine: 1.022 (ref 1.005–1.030)
pH: 6 (ref 5.0–8.0)

## 2019-03-06 MED ORDER — HYDROCODONE-ACETAMINOPHEN 5-325 MG PO TABS: 1.0000 | ORAL_TABLET | Freq: Four times a day (QID) | ORAL | 0 refills | Status: DC | PRN

## 2019-03-06 MED ORDER — HYDROCODONE-ACETAMINOPHEN 5-325 MG PO TABS
2.0000 | ORAL_TABLET | Freq: Once | ORAL | Status: AC
  Administered 2019-03-06: 2 via ORAL
  Filled 2019-03-06: qty 2

## 2019-03-06 MED ORDER — CEFTRIAXONE SODIUM 1 G IJ SOLR
1.0000 g | Freq: Once | INTRAMUSCULAR | Status: AC
  Administered 2019-03-06: 1 g via INTRAMUSCULAR
  Filled 2019-03-06: qty 10

## 2019-03-06 NOTE — ED Notes (Signed)
ED Provider at bedside. 

## 2019-03-06 NOTE — ED Notes (Addendum)
Called lab to find out about urine results, they will be running it now.

## 2019-03-06 NOTE — Discharge Instructions (Addendum)
Please read the attached instructions about caring for your Foley catheter.  Please drink plenty of clear liquids, you do not have any signs of infection, Dr. Junious Silk with the urology service will help you to treat your ongoing urinary retention.  Emergency department for severe or worsening pain fever or vomiting

## 2019-03-06 NOTE — ED Triage Notes (Signed)
Pt reports hx urinary retention, no urine passed in 11 hours.  Dr, Sabra Heck at bedside.

## 2019-03-06 NOTE — ED Provider Notes (Addendum)
[facility] EMERGENCY DEPARTMENT Provider Note   CSN: [id number] Arrival date & time: 03/06/19  0911    History   Chief Complaint Chief Complaint  Patient presents with  . Urinary Retention    HPI Wayne [name] is a 78 y.o. male.     HPI  78 y/o male - hx of bladder and prostate CA - was seen in the past couple of weeks for urinary retention, fact his first visit was on February 12, 2019 had a Foley catheter placed for urinary retention and hematuria and had a CT scan which showed no obvious signs of cause for hematuria.  There were prostate seed implants that were seen.  An 80 French coud was placed, he was seen on March 1 when a blood clot had to be removed that was blocking his urinary catheter.  This went very well.  He was to follow-up with urology in the outpatient setting.  He was then seen again on 17 March, this was a week or so after having the catheter removed, he had been urinating fine, but had the onset of fever and lower abdominal and testicular pain.  Was prescribed Levaquin for his possible epididymitis.  He is doing well with regards to that.  He presents today with recurrent urinary retention stating he has not urinated since 1030 last night.  It is been approximately 11 hours and he has increasing pain and pressure in his pelvis with the inability to pass any urine.  He denies fevers chills nausea or vomiting, he states this is similar to prior episodes, he is to see his urologist in 3 days on Tuesday.  Symptoms are persistent and have become severe this morning.  Past Medical History:  Diagnosis Date  . AICD (automatic cardioverter/defibrillator) present   . Arthritis   . Cancer [facility])    bladder and prostate  . Cellulitis of left leg    a. 02/7857 complicated by septic shock  . Chronic systolic CHF (congestive heart failure) ([place])   . CKD (chronic kidney disease), stage III ([place])    pt denies  . Colon polyps   . CORONARY ATHEROSCLEROSIS  NATIVE CORONARY ARTERY cardiologist-  dr Aundra Dubin   a. 01/2011 Cath/PCI: LM nl, LAD 40-50p, D1 80-small, LCX 95-small, RI 90, RCA 100, EF 20%;  b. 01/2011 Card MRI - No transmural scar;  c. 01/2011 PCI RCA->5 Promus DES, RI->3.0x16 Promus DES; d. Cath 01/13/13 patent LAD & Ramus, diffuse LCx dz, RCA mult overlapping stents w/ 95% osital stenosis, EF 20%, s/p DES to ostial/prox RCA 01/24/13   . Diabetes mellitus, type 2 ([place])    type 2  . GERD (gastroesophageal reflux disease)   . Hematoma of leg    a. left leg hematoma 03/2012 in the setting of asa/effient  . Herpes zoster ophthalmicus   . History of non-ST elevation myocardial infarction (NSTEMI) 06/2012  . History of prostate cancer    dx 2009---  s/p radioactive seed implants 11-24-2008  . HYPERLIPIDEMIA    intolerant to Lipitor (myalgias)  . HYPERTENSION   . Ischemic cardiomyopathy    a. 01/2012 Echo EF 45%, mild LVH; b. IF02%, grade 1 diastolic dysfunction, diffuse hypokinesis, inferoposterior akinesis   . Myocardial infarction ([place])    x2  . Noncompliance   . Obesity   . OSA (obstructive sleep apnea)    dx 2012  severe osa  no cpap just uses nose strips  . PAF (paroxysmal atrial fibrillation) ([place])  first dx 01-16-2017  . Polymyalgia rheumatica ([place])   . S/P drug eluting coronary stent placement    total 6 DES    Patient Active Problem List   Diagnosis Date Noted  . Bladder cancer ([place]) 11/09/2017  . Low back pain radiating to left lower extremity 09/21/2017  . Foot drop, left 08/24/2017  . Diabetes mellitus ([place])   . ICD (implantable cardioverter-defibrillator) discharge 07/05/2017  . Syncope and collapse   . Ventricular tachycardia ([place]) 06/18/2017  . Paroxysmal atrial fibrillation ([place]) 06/10/2017  . Hypoxemia   . VT (ventricular tachycardia) ([place])   . Cardiac arrest ([place]) 05/22/2017  . Defibrillator discharge 02/11/2017  . Atrial fibrillation with rapid ventricular response ([place]) 01/16/2017  . Coronary stent occlusion  11/18/2016  . Type 2 diabetes mellitus, uncontrolled ([place]) 09/30/2016  . CKD (chronic kidney disease) stage 3, GFR 30-59 ml/min (HCC) 09/30/2016  . CAD (coronary artery disease) 11/20/2015  . Leucocytoclastic vasculitis ([place]) 06/25/2012  . Staphylococcus aureus bacteremia with sepsis ([place]) 06/18/2012  . NSTEMI, initial episode of care ([place]) 06/16/2012  . Cellulitis and abscess of lower leg 06/14/2012    Class: Acute  . Healthcare-associated pneumonia 06/14/2012    Class: Acute  . Hyperglycemia 06/14/2012    Class: Acute  . Leg swelling 04/22/2012  . Urethral stricture 04/15/2012  . Gross hematuria 03/22/2012  . Hematochezia 03/22/2012  . Obesity 01/08/2012  . ED (erectile dysfunction) of organic origin 09/11/2011  . OSA (obstructive sleep apnea) 09/02/2011  . Systolic CHF, chronic ([place]) 06/10/2011  . HEMATOCHEZIA 02/10/2011  . BURSITIS, LEFT HIP 02/10/2011  . CORONARY ATHEROSCLEROSIS NATIVE CORONARY ARTERY 01/27/2011  . CARDIOMYOPATHY 01/21/2011  . CHEST PAIN UNSPECIFIED 01/20/2011  . DYSPNEA 01/16/2011  . ABDOMINAL PAIN, UNSPECIFIED SITE 01/13/2011  . ECZEMA 01/28/2010  . BRONCHITIS, ACUTE WITH MILD BRONCHOSPASM 12/21/2009  . HERPES ZOSTER OPHTHALMICUS 07/19/2009  . ADENOCARCINOMA, PROSTATE 06/20/2009  . Secondary DM with peripheral vascular disease, uncontrolled ([place]) 06/20/2009  . Hyperlipidemia 06/20/2009  . Essential hypertension 06/20/2009  . HYPERTROPHY PROSTATE W/UR OBST & OTH LUTS 06/20/2009    Past Surgical History:  Procedure Laterality Date  . CARDIAC CATHETERIZATION  01/24/2013  . CARDIAC CATHETERIZATION N/A 11/14/2016   Procedure: Right/Left Heart Cath and Coronary Angiography;  Surgeon: Larey Dresser, MD;  Location: [facility] CV LAB;  Service: Cardiovascular;  Laterality: N/A;  . CARDIAC CATHETERIZATION N/A 11/17/2016   Procedure: Coronary Stent Intervention w/Impella;  Surgeon: Peter M Martinique, MD;  Location: [place] CV LAB;  Service: Cardiovascular;   Laterality: N/A;  . CARDIAC CATHETERIZATION N/A 11/17/2016   Procedure: Coronary Atherectomy;  Surgeon: Peter M Martinique, MD;  Location: [facility] LAB;  Service: Cardiovascular;  Laterality: N/A;  . CORONARY ANGIOPLASTY WITH STENT PLACEMENT  01-30-2011   dr Aundra Dubin   PTCA and multiple overlapping DEStenting to RCA and 1 DES to RI   . CORONARY BALLOON ANGIOPLASTY N/A 07/06/2017   Procedure: Coronary Balloon Angioplasty;  Surgeon: Sherren Mocha, MD;  Location: [facility] CV LAB;  Service: Cardiovascular;  Laterality: N/A;  . CYSTOSCOPY WITH FULGERATION N/A 05/25/2018   Procedure: CYSTOSCOPY WITH FULGERATION/BLADDER BIOPSY , 15 mm lesion;  Surgeon: Festus Aloe, MD;  Location: [facility];  Service: Urology;  Laterality: N/A;  . I&D EXTREMITY  06/15/2012   Procedure: IRRIGATION AND DEBRIDEMENT EXTREMITY;  Surgeon: Newt Minion, MD;  Location: [facility];  Service: Orthopedics;  Laterality: Left;  I&D Left Posterior Knee  . I&D EXTREMITY  06/30/2012   Procedure: IRRIGATION AND DEBRIDEMENT  EXTREMITY;  Surgeon: Newt Minion, MD;  Location: [facility];  Service: Orthopedics;  Laterality: Left;  Left Leg Irrigation and Debridement and placement of Wound VAC and application of  A-cell  . I&D EXTREMITY  07/20/2012   Procedure: IRRIGATION AND DEBRIDEMENT EXTREMITY;  Surgeon: Newt Minion, MD;  Location: [facility];  Service: Orthopedics;  Laterality: Left;  Irrigation and Debridement Left Leg and Place antibiotic beads   . ICD IMPLANT N/A 02/12/2017   Procedure: ICD Implant;  Surgeon: Evans Lance, MD;  Location: [facility] CV LAB;  Service: Cardiovascular;  Laterality: N/A;  . LAPAROSCOPIC INGUINAL HERNIA REPAIR Right 09-11-2008   dr Barry Dienes  [facility]  . LEAD REVISION/REPAIR N/A 05/26/2017   Procedure: Lead Revision/Repair;  Surgeon: Evans Lance, MD;  Location: [place] CV LAB;  Service: Cardiovascular;  Laterality: N/A;  . LEFT HEART CATH AND CORONARY ANGIOGRAPHY N/A 07/06/2017   Procedure: Left Heart Cath and  Coronary Angiography;  Surgeon: Larey Dresser, MD;  Location: [facility] CV LAB;  Service: Cardiovascular;  Laterality: N/A;  . PERCUTANEOUS CORONARY STENT INTERVENTION (PCI-S) N/A 01/24/2013   Procedure: PERCUTANEOUS CORONARY STENT INTERVENTION (PCI-S);  Surgeon: Wellington Hampshire, MD;  Location: [facility] CATH LAB;  Service: Cardiovascular;  Laterality: N/A;  . RADIOACTIVE PROSTATE SEED IMPLANTS  11-24-2008   dr Tresa Endo  [facility]  . TRANSURETHRAL RESECTION OF BLADDER TUMOR N/A 10/30/2017   Procedure: TRANSURETHRAL RESECTION OF BLADDER TUMOR (TURBT)/ INSTILLATION OF EPIRUBICIN;  Surgeon: Festus Aloe, MD;  [facility];  Service: Urology;  Laterality: N/A;  . ULTRASOUND GUIDANCE FOR VASCULAR ACCESS  11/14/2016   Procedure: Ultrasound Guidance For Vascular Access;  Surgeon: Larey Dresser, MD;  Location: [place] [facility] CV LAB;  Service: Cardiovascular;;  . Stephanie Coup ABLATION N/A 06/19/2017   Procedure: Stephanie Coup Ablation;  Surgeon: Evans Lance, MD;  Location: [place] CV LAB;  Service: Cardiovascular;  Laterality: N/A;        Home Medications    Prior to Admission medications   Medication Sig Start Date End Date Taking? Authorizing Provider  amiodarone (PACERONE) 200 MG tablet Take one tablet by mouth Monday through Saturday.  Do NOT take on Sunday. Patient taking differently: Take 200 mg by mouth See admin instructions. Take one tablet (200 mg) by mouth Monday through Saturday.  Do NOT take on Sunday. 05/25/18  Yes Evans Lance, MD  carvedilol (COREG) 12.5 MG tablet Take 1 tablet (12.5 mg total) by mouth 2 (two) times daily. NEED FOLLOW UP APPT Patient taking differently: Take 12.5 mg by mouth 2 (two) times daily.  02/10/19  Yes Larey Dresser, MD  diphenhydramine-acetaminophen (TYLENOL PM) 25-500 MG TABS tablet Take 1 tablet at bedtime as needed by mouth (for sleep).    Yes [provider]  furosemide (LASIX) 20 MG tablet Take 1 tablet (20 mg total) by mouth 2 (two) times  daily. Patient taking differently: Take 20 mg by mouth as needed for fluid.  05/17/18  Yes Bensimhon, Shaune Pascal, MD  HAWTHORN PO Take 1 capsule by mouth 2 (two) times daily.   Yes [provider]  levofloxacin (LEVAQUIN) 500 MG tablet Take 1 tablet (500 mg total) by mouth daily. 03/01/19  Yes Lawyer, Harrell Gave, PA-C  losartan (COZAAR) 25 MG tablet Take 25 mg by mouth 2 (two) times daily. 02/10/19  Yes [provider]  Multiple Vitamin (MULTIVITAMIN WITH MINERALS) TABS tablet Take 1 tablet by mouth daily. Centrum Silver Adult 50+   Yes  [provider]  Omega-3 Fatty Acids (FISH OIL) 1000 MG CAPS Take 1,000 mg by mouth 2 (two) times daily.    Yes [provider]  OVER THE COUNTER MEDICATION Take 1 capsule by mouth 2 (two) times daily. Heart supplement from Dr. Salomon Fick Ward   Yes [provider]  rivaroxaban (XARELTO) 20 MG TABS tablet Take 1 tablet (20 mg total) by mouth daily with supper. Patient taking differently: Take 20 mg by mouth daily with breakfast.  05/27/18  Yes Festus Aloe, MD  rosuvastatin (CRESTOR) 10 MG tablet Take 1 tablet (10 mg total) by mouth daily. 04/01/18  Yes Larey Dresser, MD  spironolactone (ALDACTONE) 25 MG tablet Take 1 tablet (25 mg total) by mouth every evening. 06/22/18  Yes Larey Dresser, MD  tamsulosin (FLOMAX) 0.4 MG CAPS capsule Take 0.4 mg by mouth at bedtime. 02/17/19  Yes [provider]  ACCU-CHEK AVIVA PLUS test strip CHECK BLOOD SUGAR ONCE TO TWICE DAILY. Patient taking differently: 1 each by Other route 2 (two) times daily.  02/15/18   Burchette, Alinda Sierras, MD  HYDROcodone-acetaminophen (NORCO/VICODIN) 5-325 MG tablet Take 1-2 tablets by mouth every 6 (six) hours as needed. 03/06/19   Noemi Chapel, MD    Family History Family History  Problem Relation Age of Onset  . Cancer Mother 51       unknown CA  . Heart disease Father   . Alcohol abuse Father   . Heart attack Father 62  . Heart attack Brother      Social History Social History   Tobacco Use  . Smoking status: Former Smoker    Packs/day: 0.30    Years: 20.00    Pack years: 6.00    Types: Cigarettes    Last attempt to quit: 02/23/1989    Years since quitting: 30.0  . Smokeless tobacco: Never Used  Substance Use Topics  . Alcohol use: No  . Drug use: No     Allergies   Entresto [sacubitril-valsartan]; Crestor [rosuvastatin calcium]; Lipitor [atorvastatin]; Plavix [clopidogrel]; and Ancef [cefazolin]   Review of Systems Review of Systems  All other systems reviewed and are negative.    Physical Exam Updated Vital Signs BP 139/77   Pulse 81   SpO2 95%   Physical Exam Vitals signs and nursing note reviewed.  Constitutional:      General: He is not in acute distress.    Appearance: He is well-developed.     Comments: Uncomfortable appearing  HENT:     Head: Normocephalic and atraumatic.     Mouth/Throat:     Pharynx: No oropharyngeal exudate.  Eyes:     General: No scleral icterus.       Right eye: No discharge.        Left eye: No discharge.     Conjunctiva/sclera: Conjunctivae normal.     Pupils: Pupils are equal, round, and reactive to light.  Neck:     Musculoskeletal: Normal range of motion and neck supple.     Thyroid: No thyromegaly.     Vascular: No JVD.  Cardiovascular:     Rate and Rhythm: Normal rate and regular rhythm.     Heart sounds: Normal heart sounds. No murmur. No friction rub. No gallop.   Pulmonary:     Effort: Pulmonary effort is normal. No respiratory distress.     Breath sounds: Normal breath sounds. No wheezing or rales.  Abdominal:     General: Bowel sounds are normal. There is no  distension.     Palpations: Abdomen is soft. There is no mass.     Tenderness: There is abdominal tenderness.     Comments: ttp and fullness in the SP region, soft and NT in the upper abd, no CVA ttp  Genitourinary:    Comments: Normal appearing penis, scrotum and testicle on the R - the L  testicle is very swolen, pt states that the testicle on the L is getting better than 4 days ago - less pain and swelling, no redness Musculoskeletal: Normal range of motion.        General: No tenderness.  Lymphadenopathy:     Cervical: No cervical adenopathy.  Skin:    General: Skin is warm and dry.     Findings: No erythema or rash.  Neurological:     Mental Status: He is alert.     Coordination: Coordination normal.  Psychiatric:        Behavior: Behavior normal.      ED Treatments / Results  Labs (all labs ordered are listed, but only abnormal results are displayed) Labs Reviewed  URINALYSIS, ROUTINE W REFLEX MICROSCOPIC - Abnormal; Notable for the following components:      Result Value   Glucose, UA >=500 (*)    Hgb urine dipstick LARGE (*)    RBC / HPF >50 (*)    All other components within normal limits  URINE CULTURE    EKG None  Radiology No results found.  Procedures Procedures (including critical care time)  Medications Ordered in ED Medications  cefTRIAXone (ROCEPHIN) injection 1 g (has no administration in time range)  HYDROcodone-acetaminophen (NORCO/VICODIN) 5-325 MG per tablet 2 tablet (2 tablets Oral Given 03/06/19 1226)     Initial Impression / Assessment and Plan / ED Course  I have reviewed the triage vital signs and the nursing notes.  Pertinent labs & imaging results that were available during my care of the patient were reviewed by me and considered in my medical decision making (see chart for details).  Clinical Course as of Mar 06 1355  Sun Mar 06, 2019  1216 Urinalysis reveals blood but no signs of infection, the patient is stable for discharge   [BM]    Clinical Course User Index [BM] Noemi Chapel, MD      I suspect recurrent urinary retention - will need foley catheter in place until he follows up with urology and defer to their service on the need for ongoing catheter to prevent recurrent retention - pt state he did miss his  Flomax yesterday.  No fever, no vomiting, no systemic sx - on Levaquin - check U Cx for Urology.  Medical record review shows his creatinine several days ago was 1.04  Urinalysis negative, patient updated, given some pain medication for his enlarged left testicle with tenderness at 3 out of 10.  Again he states it is improving over time  Discussed with Dr. Junious Silk of the urology service who recommends a dose of Rocephin, suggest that the swelling of the testicle is not unusual in the circumstances and is happy to see the patient in follow-up.  Patient was informed, given medications, he does have a minor rash to Ancef so he will be watched for short time in the emergency department after Rocephin.  Home with a small amount of hydrocodone.  Final Clinical Impressions(s) / ED Diagnoses   Final diagnoses:  Urinary retention    ED Discharge Orders         Ordered  HYDROcodone-acetaminophen (NORCO/VICODIN) 5-325 MG tablet  Every 6 hours PRN     03/06/19 1355           Noemi Chapel, MD 03/06/19 1218    Noemi Chapel, MD 03/06/19 1356

## 2019-03-07 LAB — CULTURE, BLOOD (ROUTINE X 2)
Culture: NO GROWTH
Culture: NO GROWTH
Special Requests: ADEQUATE
Special Requests: ADEQUATE

## 2019-03-07 LAB — URINE CULTURE: Culture: NO GROWTH

## 2019-03-08 DIAGNOSIS — N453 Epididymo-orchitis: Secondary | ICD-10-CM | POA: Diagnosis not present

## 2019-03-08 DIAGNOSIS — C672 Malignant neoplasm of lateral wall of bladder: Secondary | ICD-10-CM | POA: Diagnosis not present

## 2019-03-08 DIAGNOSIS — R338 Other retention of urine: Secondary | ICD-10-CM | POA: Diagnosis not present

## 2019-03-14 ENCOUNTER — Encounter: Payer: Medicare Other | Admitting: *Deleted

## 2019-03-14 ENCOUNTER — Other Ambulatory Visit: Payer: Self-pay

## 2019-03-14 DIAGNOSIS — Z8551 Personal history of malignant neoplasm of bladder: Secondary | ICD-10-CM | POA: Diagnosis not present

## 2019-03-14 DIAGNOSIS — R338 Other retention of urine: Secondary | ICD-10-CM | POA: Diagnosis not present

## 2019-03-14 DIAGNOSIS — N453 Epididymo-orchitis: Secondary | ICD-10-CM | POA: Diagnosis not present

## 2019-03-15 ENCOUNTER — Other Ambulatory Visit: Payer: Self-pay

## 2019-03-15 ENCOUNTER — Telehealth: Payer: Self-pay

## 2019-03-15 ENCOUNTER — Telehealth: Payer: Self-pay | Admitting: Internal Medicine

## 2019-03-15 NOTE — Telephone Encounter (Signed)
New Message          Patient is calling in for a new monitor he says it will not transmitt and he would like a new one sent out. Pls call and advise.

## 2019-03-15 NOTE — Patient Outreach (Signed)
[facility]) Care Management  03/15/2019  [name] [birth year] [id number]   Medication Adherence call to [name] spoke with patient he wants a call back at a later time patient is due on Losartan 25 mg and Rosuvastatin 10 mg under [place].   [place] Management Direct Dial [phone]  Fax [phone] [email or web address]

## 2019-03-15 NOTE — Telephone Encounter (Signed)
Left message for patient to remind of missed remote transmission.  

## 2019-03-15 NOTE — Telephone Encounter (Signed)
When patient asked what type of monitor he had that was not working, he stated it was the one that transmits his ICD data (medtonic).  He has called the company and they will be sending him a new one.  He just wanted to let Dr. Lovena Le know.

## 2019-03-18 ENCOUNTER — Ambulatory Visit (INDEPENDENT_AMBULATORY_CARE_PROVIDER_SITE_OTHER): Payer: Medicare Other | Admitting: *Deleted

## 2019-03-18 ENCOUNTER — Other Ambulatory Visit: Payer: Self-pay

## 2019-03-18 DIAGNOSIS — I5043 Acute on chronic combined systolic (congestive) and diastolic (congestive) heart failure: Secondary | ICD-10-CM

## 2019-03-18 DIAGNOSIS — I255 Ischemic cardiomyopathy: Secondary | ICD-10-CM

## 2019-03-18 LAB — CUP PACEART REMOTE DEVICE CHECK
Battery Remaining Longevity: 109 mo
Battery Voltage: 3 V
Brady Statistic RV Percent Paced: 0.01 %
Date Time Interrogation Session: 20200403151740
HighPow Impedance: 74 Ohm
Implantable Lead Implant Date: 20180612
Implantable Lead Location: 753860
Implantable Lead Model: 6935
Implantable Pulse Generator Implant Date: 20180103
Lead Channel Impedance Value: 342 Ohm
Lead Channel Impedance Value: 437 Ohm
Lead Channel Pacing Threshold Amplitude: 1.125 V
Lead Channel Pacing Threshold Pulse Width: 0.4 ms
Lead Channel Sensing Intrinsic Amplitude: 14.25 mV
Lead Channel Sensing Intrinsic Amplitude: 14.25 mV
Lead Channel Setting Pacing Amplitude: 2.5 V
Lead Channel Setting Pacing Pulse Width: 0.4 ms
Lead Channel Setting Sensing Sensitivity: 0.3 mV

## 2019-03-24 ENCOUNTER — Other Ambulatory Visit: Payer: Self-pay

## 2019-03-24 NOTE — Patient Outreach (Signed)
[facility]) Care Management  03/24/2019  ELIAN GLOSTER [birth year] [id number]   Medication Adherence call to Mr. [place] Compliant Voice message left with a call back number. Mr. Fontan is showing past due on Losartan 25 mg and Rosuvastatin 10 mg under [place].   [place] Management Direct Dial [phone]  Fax [phone] [email or web address]

## 2019-03-24 NOTE — Progress Notes (Signed)
Remote ICD transmission.   

## 2019-03-31 ENCOUNTER — Telehealth: Payer: Self-pay | Admitting: Internal Medicine

## 2019-03-31 NOTE — Telephone Encounter (Signed)
°  1. Has your device fired?   2. Is you device beeping?   3. Are you experiencing draining or swelling at device site?   4. Are you calling to see if we received your device transmission? Patient is calling about a letter he received.  He wants to make sure you received his last transmission.   5. Have you passed out?     Please route to Wayne [name]

## 2019-03-31 NOTE — Telephone Encounter (Signed)
I explained to the pt that the letter stating we did not received his transmission was sent before we received his transmission. I did let him know we did indeed received his transmission and another letter is being mailed out to state when his next transmission will be.

## 2019-04-15 ENCOUNTER — Other Ambulatory Visit (HOSPITAL_COMMUNITY): Payer: Self-pay | Admitting: Cardiology

## 2019-04-15 ENCOUNTER — Other Ambulatory Visit: Payer: Self-pay

## 2019-04-15 NOTE — Patient Outreach (Signed)
[facility]) Care Management  04/15/2019  Wayne [name] Jan 22, 1941 [id number]   Medication Adherence call to [name] [employer] spoke with patient he is due on Losartan 25 mg and Rosuvastatin 10 mg patient is taking 1 tablet 2 times a day on Losartan and Rosuvastatin he is taking 1 tablet daily patient said sometimes he forgets to take the medications and knows he needs to do better patient ask to call [facility] and order both medication and will pick up when ready ,[facility] will have it [facility] patient. Wayne [name] is showing past due under [place].   [place] Management Direct Dial [phone]  Fax [phone] [name]

## 2019-04-16 ENCOUNTER — Other Ambulatory Visit: Payer: Self-pay | Admitting: Internal Medicine

## 2019-04-19 ENCOUNTER — Other Ambulatory Visit (HOSPITAL_COMMUNITY): Payer: Self-pay

## 2019-04-19 MED ORDER — ROSUVASTATIN CALCIUM 10 MG PO TABS: 10.0000 mg | ORAL_TABLET | Freq: Every day | ORAL | 4 refills | Status: DC

## 2019-04-19 NOTE — Telephone Encounter (Signed)
Received fax to refill Crestor

## 2019-04-21 ENCOUNTER — Other Ambulatory Visit (HOSPITAL_COMMUNITY): Payer: Self-pay | Admitting: Cardiology

## 2019-04-21 DIAGNOSIS — I5022 Chronic systolic (congestive) heart failure: Secondary | ICD-10-CM

## 2019-05-11 ENCOUNTER — Other Ambulatory Visit (HOSPITAL_COMMUNITY): Payer: Self-pay

## 2019-05-11 MED ORDER — RIVAROXABAN 20 MG PO TABS: 20.0000 mg | ORAL_TABLET | Freq: Every day | ORAL | 1 refills | Status: DC

## 2019-05-25 ENCOUNTER — Other Ambulatory Visit (HOSPITAL_COMMUNITY): Payer: Self-pay | Admitting: Cardiology

## 2019-05-25 ENCOUNTER — Other Ambulatory Visit (HOSPITAL_COMMUNITY): Payer: Self-pay | Admitting: Internal Medicine

## 2019-05-25 DIAGNOSIS — I5022 Chronic systolic (congestive) heart failure: Secondary | ICD-10-CM

## 2019-05-30 ENCOUNTER — Encounter: Payer: Self-pay | Admitting: *Deleted

## 2019-05-30 DIAGNOSIS — Z006 Encounter for examination for normal comparison and control in clinical research program: Secondary | ICD-10-CM

## 2019-05-30 NOTE — Research (Signed)
[facility] appt completed over phone due to [place] 19 pandemic.  Patient doing well, no complaints of cp, shortness of breath, or edema.  No med changes per patient.  His next follow up is in October 2020. Hoping to see patient in office at that time.  Covid 19 questionaire provided by [facility] were reviewed with patient over phone.

## 2019-06-16 ENCOUNTER — Ambulatory Visit (INDEPENDENT_AMBULATORY_CARE_PROVIDER_SITE_OTHER): Payer: Medicare Other | Admitting: *Deleted

## 2019-06-16 ENCOUNTER — Telehealth: Payer: Self-pay

## 2019-06-16 DIAGNOSIS — I255 Ischemic cardiomyopathy: Secondary | ICD-10-CM

## 2019-06-16 NOTE — Telephone Encounter (Signed)
Left message for patient to remind of missed remote transmission.  

## 2019-06-19 LAB — CUP PACEART REMOTE DEVICE CHECK
Battery Remaining Longevity: 105 mo
Battery Voltage: 3 V
Brady Statistic RV Percent Paced: 0.01 %
Date Time Interrogation Session: 20200703113511
HighPow Impedance: 75 Ohm
Implantable Lead Implant Date: 20180612
Implantable Lead Location: 753860
Implantable Lead Model: 6935
Implantable Pulse Generator Implant Date: 20180103
Lead Channel Impedance Value: 342 Ohm
Lead Channel Impedance Value: 437 Ohm
Lead Channel Pacing Threshold Amplitude: 1 V
Lead Channel Pacing Threshold Pulse Width: 0.4 ms
Lead Channel Sensing Intrinsic Amplitude: 14.375 mV
Lead Channel Sensing Intrinsic Amplitude: 14.375 mV
Lead Channel Setting Pacing Amplitude: 2.5 V
Lead Channel Setting Pacing Pulse Width: 0.4 ms
Lead Channel Setting Sensing Sensitivity: 0.3 mV

## 2019-06-24 ENCOUNTER — Encounter: Payer: Self-pay | Admitting: Cardiology

## 2019-06-24 NOTE — Progress Notes (Signed)
Remote ICD transmission.   

## 2019-07-08 ENCOUNTER — Other Ambulatory Visit: Payer: Self-pay | Admitting: Internal Medicine

## 2019-07-08 ENCOUNTER — Other Ambulatory Visit (HOSPITAL_COMMUNITY): Payer: Self-pay | Admitting: Cardiology

## 2019-07-14 DIAGNOSIS — R310 Gross hematuria: Secondary | ICD-10-CM | POA: Diagnosis not present

## 2019-07-14 DIAGNOSIS — Z8551 Personal history of malignant neoplasm of bladder: Secondary | ICD-10-CM | POA: Diagnosis not present

## 2019-08-03 ENCOUNTER — Telehealth: Payer: Self-pay

## 2019-08-03 NOTE — Telephone Encounter (Signed)
Pt reports he has had no symptoms or problems. He woke up from sleep and thought he had been shocked. Informed transmission showed no events and no tx be device. Patient was reassured and stated he may have dreanmmed that something happened in his sleep. Shock plan reviewed and patient reassured.

## 2019-08-03 NOTE — Telephone Encounter (Signed)
Reviewed transmission in [facility]. No episodes , no ATP or shocks delivered.

## 2019-08-03 NOTE — Telephone Encounter (Signed)
Pt states that while he was sleeping his ICD went off. He said it felt like a [place] hit him. I asked the pt to send a manual transmission. I told him as soon as I see the transmission I would let the nurse take a look at it and give him a call back. The pt verbalized understanding.

## 2019-08-09 ENCOUNTER — Telehealth (HOSPITAL_COMMUNITY): Payer: Self-pay

## 2019-08-09 NOTE — Telephone Encounter (Signed)
I can look into it and get back with you. Since its under $50 they may deny it. I will confirm and let you know. Thanks

## 2019-08-09 NOTE — Telephone Encounter (Signed)
Received notification that patient needs help with being able to afford his Xarelto. Started [employer] application for him.

## 2019-08-09 NOTE — Telephone Encounter (Signed)
Hey can we get this patient re-enrolled in pt assistance for xarelto? He had pt assistance until 4/20. While he was on it, it was affordable. Right now its $47/ month. Is this a possibility?

## 2019-08-09 NOTE — Telephone Encounter (Signed)
Spoke with patient. He is going to bring his Part D card for [place] to copy, sign the application and bring in his bank statement for [employer]. Took application to front check in desk.  Will update as information comes available.  Charlann Boxer, CPhT

## 2019-08-11 ENCOUNTER — Telehealth (HOSPITAL_COMMUNITY): Payer: Self-pay | Admitting: Pharmacy Technician

## 2019-08-11 NOTE — Telephone Encounter (Signed)
Patient signed paperwork and brought in insurance card. All was successfully faxed to [facility]  Fax: [phone]  Will continue to follow.  Charlann Boxer, CPhT

## 2019-08-18 ENCOUNTER — Other Ambulatory Visit (HOSPITAL_COMMUNITY): Payer: Self-pay | Admitting: Cardiology

## 2019-08-18 DIAGNOSIS — I5022 Chronic systolic (congestive) heart failure: Secondary | ICD-10-CM

## 2019-08-19 NOTE — Telephone Encounter (Signed)
Called [employer], they stated they need proof that patient has met his 4% OOP expense ($1,008). Patient can call pharmacy and get a print out showing how much he has spent for the year.   Called and left patient message.  Charlann Boxer, CPhT

## 2019-08-23 NOTE — Telephone Encounter (Signed)
Spoke with patient. He does not think that he has met the OOP expense for the year but is going to check with [facility] where he fills all his medications at and get back with me.  Charlann Boxer, CPhT

## 2019-09-01 ENCOUNTER — Telehealth (HOSPITAL_COMMUNITY): Payer: Self-pay | Admitting: Pharmacy Technician

## 2019-09-01 NOTE — Telephone Encounter (Signed)
Spoke with patient, he does not think he has met his OOP that is needed for [employer]. He is going to call his pharmacy to be sure, thinks he will be ok for the time being with paying his co-pay.  Advised him to call me if he has any issues or questions going forward.  Charlann Boxer, CPhT

## 2019-09-01 NOTE — Telephone Encounter (Signed)
Called to check and see if he was able to obtain OOP expense report, had to leave a voicemail.  Charlann Boxer, CPhT

## 2019-09-15 ENCOUNTER — Ambulatory Visit (INDEPENDENT_AMBULATORY_CARE_PROVIDER_SITE_OTHER): Payer: Medicare Other | Admitting: *Deleted

## 2019-09-15 DIAGNOSIS — I255 Ischemic cardiomyopathy: Secondary | ICD-10-CM

## 2019-09-15 LAB — CUP PACEART REMOTE DEVICE CHECK
Battery Remaining Longevity: 101 mo
Battery Voltage: 3 V
Brady Statistic RV Percent Paced: 0.01 %
Date Time Interrogation Session: 20201001120743
HighPow Impedance: 85 Ohm
Implantable Lead Implant Date: 20180612
Implantable Lead Location: 753860
Implantable Lead Model: 6935
Implantable Pulse Generator Implant Date: 20180103
Lead Channel Impedance Value: 380 Ohm
Lead Channel Impedance Value: 456 Ohm
Lead Channel Pacing Threshold Amplitude: 1 V
Lead Channel Pacing Threshold Pulse Width: 0.4 ms
Lead Channel Sensing Intrinsic Amplitude: 15.875 mV
Lead Channel Sensing Intrinsic Amplitude: 15.875 mV
Lead Channel Setting Pacing Amplitude: 2.5 V
Lead Channel Setting Pacing Pulse Width: 0.4 ms
Lead Channel Setting Sensing Sensitivity: 0.3 mV

## 2019-09-22 ENCOUNTER — Encounter: Payer: Self-pay | Admitting: Cardiology

## 2019-09-22 NOTE — Progress Notes (Signed)
Remote ICD transmission.   

## 2019-10-06 ENCOUNTER — Other Ambulatory Visit (HOSPITAL_COMMUNITY): Payer: Self-pay | Admitting: Cardiology

## 2019-10-06 ENCOUNTER — Other Ambulatory Visit: Payer: Self-pay | Admitting: Internal Medicine

## 2019-10-17 ENCOUNTER — Telehealth: Payer: Self-pay

## 2019-10-17 NOTE — Telephone Encounter (Signed)
Copied from [place] [phone]. Topic: Referral - Request for Referral >> Oct 17, 2019 12:32 PM Burchel, Abbi R wrote: Reason for CRM: Pt's wife called to request referral for Neuropathy.  Please advise

## 2019-10-22 NOTE — Telephone Encounter (Signed)
Set up office or Doxy to discuss.

## 2019-10-24 NOTE — Telephone Encounter (Signed)
Wife called back and was offered phone visit for Wed with Dr Elease Hashimoto.  She was upset that no appt available until then.  There is a same day tomorrow if Dr wants to use that appt.  Please advise and call her back.

## 2019-10-24 NOTE — Telephone Encounter (Signed)
Attempted to reach pt, no answer. Left vm to call back. Per Dr. Elease Hashimoto pt needs a doxy visit or telephone visit

## 2019-10-24 NOTE — Telephone Encounter (Signed)
OK to use same day appt any day this week that I am here.

## 2019-10-25 NOTE — Telephone Encounter (Signed)
I have the patient scheduled for a virtual visit tomorrow at 5 PM

## 2019-10-25 NOTE — Telephone Encounter (Signed)
Wayne [name], I spoke with wife explained to her the situation. She understood and was ok with pt doing a telephone visit for tomorrow 10/26/19 At 5pm. Please use pt's mobile # in chart [phone].... No need to call them just needs to be scheduled. Thanks

## 2019-10-26 ENCOUNTER — Telehealth (INDEPENDENT_AMBULATORY_CARE_PROVIDER_SITE_OTHER): Payer: Medicare Other | Admitting: Family Medicine

## 2019-10-26 ENCOUNTER — Encounter: Payer: Self-pay | Admitting: *Deleted

## 2019-10-26 DIAGNOSIS — E1140 Type 2 diabetes mellitus with diabetic neuropathy, unspecified: Secondary | ICD-10-CM | POA: Diagnosis not present

## 2019-10-26 DIAGNOSIS — Z006 Encounter for examination for normal comparison and control in clinical research program: Secondary | ICD-10-CM

## 2019-10-26 DIAGNOSIS — E1165 Type 2 diabetes mellitus with hyperglycemia: Secondary | ICD-10-CM

## 2019-10-26 DIAGNOSIS — IMO0002 Reserved for concepts with insufficient information to code with codable children: Secondary | ICD-10-CM

## 2019-10-26 NOTE — Progress Notes (Signed)
This visit type was conducted due to national recommendations for restrictions regarding the COVID-19 pandemic in an effort to limit this patient's exposure and mitigate transmission in our community.   Virtual Visit via Telephone Note  I connected with Wayne [name] on 10/26/19 at  5:00 PM EST by telephone and verified that I am speaking with the correct person using two identifiers.   I discussed the limitations, risks, security and privacy concerns of performing an evaluation and management service by telephone and the availability of in person appointments. I also discussed with the patient that there may be a patient responsible charge related to this service. The patient expressed understanding and agreed to proceed.  Location patient: home Location provider: work or home office Participants present for the call: patient, provider Patient did not have a visit in the prior 7 days to address this/these issue(s).   History of Present Illness: Spoke with Wayne [name] regarding recent phone call.  He had called requesting referral to neurologist for "neuropathy ".  He apparently has mostly symptoms involving the feet but sometimes hands.  Does have some pain and occasional tingling and numbness.  He has type 2 diabetes with history of very poor control.  Last A1c was over a year ago 10.6.  He had been on glimepiride.  He has history of renal insufficiency and cannot take several medications.  Also hx of severe systolic failure so not a good candidate for Metformin or Actos.  Not taking any insulin.  Blood sugars very poorly controlled.  Poor compliance with follow-up.  He states he struggled in past to get medications and comply with follow-up because of lack of insurance until recently  He is very reluctant to come to office because of very high risk if he gets Covid.  He has severe systolic heart failure.  He had labs back in March with creatinine 1.04 which is improved.  Past Medical History:   Diagnosis Date  . AICD (automatic cardioverter/defibrillator) present   . Arthritis   . Cancer [facility])    bladder and prostate  . Cellulitis of left leg    a. 0000000 complicated by septic shock  . Chronic systolic CHF (congestive heart failure) ([place])   . CKD (chronic kidney disease), stage III ([place])    pt denies  . Colon polyps   . CORONARY ATHEROSCLEROSIS NATIVE CORONARY ARTERY cardiologist-  dr Aundra Dubin   a. 01/2011 Cath/PCI: LM nl, LAD 40-50p, D1 80-small, LCX 95-small, RI 90, RCA 100, EF 20%;  b. 01/2011 Card MRI - No transmural scar;  c. 01/2011 PCI RCA->5 Promus DES, RI->3.0x16 Promus DES; d. Cath 01/13/13 patent LAD & Ramus, diffuse LCx dz, RCA mult overlapping stents w/ 95% osital stenosis, EF 20%, s/p DES to ostial/prox RCA 01/24/13   . Diabetes mellitus, type 2 ([place])    type 2  . GERD (gastroesophageal reflux disease)   . Hematoma of leg    a. left leg hematoma 03/2012 in the setting of asa/effient  . Herpes zoster ophthalmicus   . History of non-ST elevation myocardial infarction (NSTEMI) 06/2012  . History of prostate cancer    dx 2009---  s/p radioactive seed implants 11-24-2008  . HYPERLIPIDEMIA    intolerant to Lipitor (myalgias)  . HYPERTENSION   . Ischemic cardiomyopathy    a. 01/2012 Echo EF 45%, mild LVH; b. [id number], grade 1 diastolic dysfunction, diffuse hypokinesis, inferoposterior akinesis   . Myocardial infarction ([place])    x2  . Noncompliance   . Obesity   .  OSA (obstructive sleep apnea)    dx 2012  severe osa  no cpap just uses nose strips  . PAF (paroxysmal atrial fibrillation) ([place])    first dx 01-16-2017  . Polymyalgia rheumatica ([place])   . S/P drug eluting coronary stent placement    total 6 DES   Past Surgical History:  Procedure Laterality Date  . CARDIAC CATHETERIZATION  01/24/2013  . CARDIAC CATHETERIZATION N/A 11/14/2016   Procedure: Right/Left Heart Cath and Coronary Angiography;  Surgeon: Larey Dresser, MD;  Location: [place] [facility];  Service:  Cardiovascular;  Laterality: N/A;  . CARDIAC CATHETERIZATION N/A 11/17/2016   Procedure: Coronary Stent Intervention w/Impella;  Surgeon: Peter M Martinique, MD;  Location: [place] [facility];  Service: Cardiovascular;  Laterality: N/A;  . CARDIAC CATHETERIZATION N/A 11/17/2016   Procedure: Coronary Atherectomy;  Surgeon: Peter M Martinique, MD;  Location: [place] [facility];  Service: Cardiovascular;  Laterality: N/A;  . CORONARY ANGIOPLASTY WITH STENT PLACEMENT  01-30-2011   dr Aundra Dubin   PTCA and multiple overlapping DEStenting to RCA and 1 DES to RI   . CORONARY BALLOON ANGIOPLASTY N/A 07/06/2017   Procedure: Coronary Balloon Angioplasty;  Surgeon: Sherren Mocha, MD;  Location: [place] [facility];  Service: Cardiovascular;  Laterality: N/A;  . CYSTOSCOPY WITH FULGERATION N/A 05/25/2018   Procedure: CYSTOSCOPY WITH FULGERATION/BLADDER BIOPSY , 15 mm lesion;  Surgeon: Festus Aloe, MD;  Location: [facility];  Service: Urology;  Laterality: N/A;  . I&D EXTREMITY  06/15/2012   Procedure: IRRIGATION AND DEBRIDEMENT EXTREMITY;  Surgeon: Newt Minion, MD;  Location: [place];  Service: Orthopedics;  Laterality: Left;  I&D Left Posterior Knee  . I&D EXTREMITY  06/30/2012   Procedure: IRRIGATION AND DEBRIDEMENT EXTREMITY;  Surgeon: Newt Minion, MD;  Location: [place];  Service: Orthopedics;  Laterality: Left;  Left Leg Irrigation and Debridement and placement of Wound VAC and application of  A-cell  . I&D EXTREMITY  07/20/2012   Procedure: IRRIGATION AND DEBRIDEMENT EXTREMITY;  Surgeon: Newt Minion, MD;  Location: [place];  Service: Orthopedics;  Laterality: Left;  Irrigation and Debridement Left Leg and Place antibiotic beads   . ICD IMPLANT N/A 02/12/2017   Procedure: ICD Implant;  Surgeon: Evans Lance, MD;  Location: [place] [facility];  Service: Cardiovascular;  Laterality: N/A;  . LAPAROSCOPIC INGUINAL HERNIA REPAIR Right 09-11-2008   dr Barry Dienes  [facility]  . LEAD REVISION/REPAIR N/A 05/26/2017   Procedure: Lead  Revision/Repair;  Surgeon: Evans Lance, MD;  Location: [place] [facility];  Service: Cardiovascular;  Laterality: N/A;  . LEFT HEART CATH AND CORONARY ANGIOGRAPHY N/A 07/06/2017   Procedure: Left Heart Cath and Coronary Angiography;  Surgeon: Larey Dresser, MD;  Location: [place] [facility];  Service: Cardiovascular;  Laterality: N/A;  . PERCUTANEOUS CORONARY STENT INTERVENTION (PCI-S) N/A 01/24/2013   Procedure: PERCUTANEOUS CORONARY STENT INTERVENTION (PCI-S);  Surgeon: Wellington Hampshire, MD;  Location: [facility];  Service: Cardiovascular;  Laterality: N/A;  . RADIOACTIVE PROSTATE SEED IMPLANTS  11-24-2008   dr Tresa Endo  [facility]  . TRANSURETHRAL RESECTION OF BLADDER TUMOR N/A 10/30/2017   Procedure: TRANSURETHRAL RESECTION OF BLADDER TUMOR (TURBT)/ INSTILLATION OF EPIRUBICIN;  Surgeon: Festus Aloe, MD;  [facility];  Service: Urology;  Laterality: N/A;  . ULTRASOUND GUIDANCE FOR VASCULAR ACCESS  11/14/2016   Procedure: Ultrasound Guidance For Vascular Access;  Surgeon: Larey Dresser, MD;  Location: [place] [facility];  Service: Cardiovascular;;  .  V TACH ABLATION N/A 06/19/2017   Procedure: V Tach Ablation;  Surgeon: Evans Lance, MD;  Location: [place] [facility];  Service: Cardiovascular;  Laterality: N/A;    reports that he quit smoking about 30 years ago. His smoking use included cigarettes. He has a 6.00 pack-year smoking history. He has never used smokeless tobacco. He reports that he does not drink alcohol or use drugs. family history includes Alcohol abuse in his father; Cancer (age of onset: 98) in his mother; Heart attack in his brother; Heart attack (age of onset: 30) in his father; Heart disease in his father. Allergies  Allergen Reactions  . Entresto [Sacubitril-Valsartan] Swelling and Other (See Comments)    Angioedema  . Crestor [Rosuvastatin Calcium] Other (See Comments)    Myalgia, Interfering with Gait in high doses  . Lipitor [Atorvastatin] Other (See  Comments)    Makes legs sore  . Plavix [Clopidogrel] Hives, Itching, Swelling and Other (See Comments)    Nose bleeds and welts on legs & back  . Ancef [Cefazolin] Itching, Rash and Other (See Comments)    Describes itching and rash, but said that "it wasn't that bad"      Observations/Objective: Patient sounds cheerful and well on the phone. I do not appreciate any SOB. Speech and thought processing are grossly intact. Patient reported vitals:  Assessment and Plan: #1 poorly controlled type 2 diabetes.  He has complication of neuropathy which is likely related to his diabetes.  -We talked at some length about diabetes medication options.  He is not a good candidate for Metformin with his severe heart failure.  We discussed other options including sulfonylurea, SGLT2 medications, insulin. -Recommend basal long-acting insulin such as Levemir 10 units daily and titrate up 2 units every 3 days until fasting blood sugars are consistently around 130.  We recommended follow-up by phone visit to review readings in a few weeks  Follow Up Instructions:  - 3 weeks.   [id number] 5-10 [id number] 11-20 99443 21-30 I did not refer this patient for an OV in the next 24 hours for this/these issue(s).  I discussed the assessment and treatment plan with the patient. The patient was provided an opportunity to ask questions and all were answered. The patient agreed with the plan and demonstrated an understanding of the instructions.   The patient was advised to call back or seek an in-person evaluation if the symptoms worsen or if the condition fails to improve as anticipated.  I provided 20 minutes of non-face-to-face time during this encounter.   Carolann Littler, MD

## 2019-10-27 ENCOUNTER — Other Ambulatory Visit: Payer: Self-pay | Admitting: *Deleted

## 2019-10-27 DIAGNOSIS — E1140 Type 2 diabetes mellitus with diabetic neuropathy, unspecified: Secondary | ICD-10-CM

## 2019-10-27 DIAGNOSIS — E11649 Type 2 diabetes mellitus with hypoglycemia without coma: Secondary | ICD-10-CM

## 2019-10-27 DIAGNOSIS — IMO0002 Reserved for concepts with insufficient information to code with codable children: Secondary | ICD-10-CM

## 2019-10-27 MED ORDER — INSULIN DETEMIR 100 UNIT/ML FLEXPEN: SUBCUTANEOUS | 5 refills | Status: DC

## 2019-10-28 ENCOUNTER — Telehealth: Payer: Self-pay | Admitting: *Deleted

## 2019-10-28 NOTE — Telephone Encounter (Signed)
-----   Message from [name], MD sent at 10/27/2019  4:29 PM EST ----- Thanks. ----- Message ----- From: Alverda Skeans, RN Sent: 10/27/2019   3:24 PM EST To: [name], MD  Good Afternoon,   I ave tried twice to call this rx in and was left on hold for over 20 min. I will try again tomorrow. Im running a provider today as well  ----- Message ----- From: [name], MD Sent: 10/27/2019   6:02 AM EST To: Alverda Skeans, RN  Roselyn Reef,  Sorry to bother.  I saw this patient yesterday by phone follow up.  Started Levemir and for some reason would not allow me to send in by e-prescribe.  Could you or someone else call in for him?  Also, he needs 3 week follow up phone or Doxy.  Thanks!  Darnell Level

## 2019-10-28 NOTE — Telephone Encounter (Signed)
Medication called in. Attempted to contact patient to make 3 week f/u appointment. No answer. LVM for patient to return call.

## 2019-11-07 ENCOUNTER — Other Ambulatory Visit (HOSPITAL_COMMUNITY): Payer: Self-pay

## 2019-11-07 DIAGNOSIS — I5022 Chronic systolic (congestive) heart failure: Secondary | ICD-10-CM

## 2019-11-07 MED ORDER — CARVEDILOL 12.5 MG PO TABS: 12.5000 mg | ORAL_TABLET | Freq: Two times a day (BID) | ORAL | 3 refills | Status: DC

## 2019-11-30 ENCOUNTER — Encounter: Payer: Self-pay | Admitting: Internal Medicine

## 2019-11-30 ENCOUNTER — Ambulatory Visit (INDEPENDENT_AMBULATORY_CARE_PROVIDER_SITE_OTHER): Payer: Medicare Other | Admitting: Internal Medicine

## 2019-11-30 ENCOUNTER — Other Ambulatory Visit: Payer: Self-pay

## 2019-11-30 VITALS — BP 128/70 | HR 68 | Ht 68.0 in | Wt 239.0 lb

## 2019-11-30 DIAGNOSIS — I5022 Chronic systolic (congestive) heart failure: Secondary | ICD-10-CM

## 2019-11-30 DIAGNOSIS — I255 Ischemic cardiomyopathy: Secondary | ICD-10-CM | POA: Diagnosis not present

## 2019-11-30 DIAGNOSIS — I472 Ventricular tachycardia, unspecified: Secondary | ICD-10-CM

## 2019-11-30 DIAGNOSIS — Z95810 Presence of automatic (implantable) cardiac defibrillator: Secondary | ICD-10-CM | POA: Insufficient documentation

## 2019-11-30 NOTE — Progress Notes (Signed)
HPI [name] returns today for follow-up. He is a very pleasant 78 year old man with an extensive past medical history including chronic systolic heart failure and an ischemic cardiomyopathy. The patient has a history of ventricular fibrillation cardiac arrest with prolonged downtime as his ICD under sensed his ventricular fibrillation. The patient has a history of chronic systolic heart failure but his symptoms are currently improved. Since beginning amiodarone, he has developed a slight tremor. He denies chest pain. He has class II heart failure symptoms. He has had no recent ICD therapies. He has some neuropathy type of symptoms.  Allergies  Allergen Reactions  . Entresto [Sacubitril-Valsartan] Swelling and Other (See Comments)    Angioedema  . Crestor [Rosuvastatin Calcium] Other (See Comments)    Myalgia, Interfering with Gait in high doses  . Lipitor [Atorvastatin] Other (See Comments)    Makes legs sore  . Plavix [Clopidogrel] Hives, Itching, Swelling and Other (See Comments)    Nose bleeds and welts on legs & back  . Ancef [Cefazolin] Itching, Rash and Other (See Comments)    Describes itching and rash, but said that "it wasn't that bad"     Current Outpatient Medications  Medication Sig Dispense Refill  . ACCU-CHEK AVIVA PLUS test strip CHECK BLOOD SUGAR ONCE TO TWICE DAILY. (Patient taking differently: 1 each by Other route 2 (two) times daily. ) 200 each 0  . amiodarone (PACERONE) 200 MG tablet TAKE 1 TABLET BY MOUTH DAILY ON MONDAY THROUGH SATURDAY. DO NOT TAKE IT ON Sunday. Please keep upcoming appt in December with Dr. Lovena Le. Thanks 90 tablet 0  . carvedilol (COREG) 12.5 MG tablet Take 1 tablet (12.5 mg total) by mouth 2 (two) times daily with a meal. 30 tablet 3  . diphenhydramine-acetaminophen (TYLENOL PM) 25-500 MG TABS tablet Take 1 tablet at bedtime as needed by mouth (for sleep).     . furosemide (LASIX) 20 MG tablet TAKE 1 TABLET(20 MG) BY MOUTH TWICE  DAILY 60 tablet 3  . HAWTHORN PO Take 1 capsule by mouth 2 (two) times daily.    Marland Kitchen HYDROcodone-acetaminophen (NORCO/VICODIN) 5-325 MG tablet Take 1-2 tablets by mouth every 6 (six) hours as needed. 10 tablet 0  . insulin detemir (LEVEMIR) 100 unit/ml SOLN Start 10 units  qhs and titrate up 2 units every 3 days for fasting blood sugars consistently > 130 3 mL 5  . levofloxacin (LEVAQUIN) 500 MG tablet Take 1 tablet (500 mg total) by mouth daily. 10 tablet 0  . losartan (COZAAR) 25 MG tablet TAKE 1 TABLET BY MOUTH TWICE DAILY. NEED APPINTMENT FOR FUTURE REFILLS 180 tablet 0  . Multiple Vitamin (MULTIVITAMIN WITH MINERALS) TABS tablet Take 1 tablet by mouth daily. Centrum Silver Adult 69+    . Omega-3 Fatty Acids (FISH OIL) 1000 MG CAPS Take 1,000 mg by mouth 2 (two) times daily.     Marland Kitchen OVER THE COUNTER MEDICATION Take 1 capsule by mouth 2 (two) times daily. Heart supplement from Dr. Salomon Fick Ward    . rivaroxaban (XARELTO) 20 MG TABS tablet Take 1 tablet (20 mg total) by mouth daily with supper. 90 tablet 1  . rosuvastatin (CRESTOR) 10 MG tablet TAKE 1 TABLET BY MOUTH DAILY 90 tablet 0  . spironolactone (ALDACTONE) 25 MG tablet Take 1 tablet (25 mg total) by mouth every evening. 30 tablet 6  . tamsulosin (FLOMAX) 0.4 MG CAPS capsule Take 0.4 mg by mouth at bedtime.     No current facility-administered medications for  this visit.     Past Medical History:  Diagnosis Date  . AICD (automatic cardioverter/defibrillator) present   . Arthritis   . Cancer [facility])    bladder and prostate  . Cellulitis of left leg    a. 0000000 complicated by septic shock  . Chronic systolic CHF (congestive heart failure) ([place])   . CKD (chronic kidney disease), stage III    pt denies  . Colon polyps   . CORONARY ATHEROSCLEROSIS NATIVE CORONARY ARTERY cardiologist-  dr Aundra Dubin   a. 01/2011 Cath/PCI: LM nl, LAD 40-50p, D1 80-small, LCX 95-small, RI 90, RCA 100, EF 20%;  b. 01/2011 Card MRI - No transmural scar;  c. 01/2011  PCI RCA->5 Promus DES, RI->3.0x16 Promus DES; d. Cath 01/13/13 patent LAD & Ramus, diffuse LCx dz, RCA mult overlapping stents w/ 95% osital stenosis, EF 20%, s/p DES to ostial/prox RCA 01/24/13   . Diabetes mellitus, type 2 ([place])    type 2  . GERD (gastroesophageal reflux disease)   . Hematoma of leg    a. left leg hematoma 03/2012 in the setting of asa/effient  . Herpes zoster ophthalmicus   . History of non-ST elevation myocardial infarction (NSTEMI) 06/2012  . History of prostate cancer    dx 2009---  s/p radioactive seed implants 11-24-2008  . HYPERLIPIDEMIA    intolerant to Lipitor (myalgias)  . HYPERTENSION   . Ischemic cardiomyopathy    a. 01/2012 Echo EF 45%, mild LVH; b. [id number], grade 1 diastolic dysfunction, diffuse hypokinesis, inferoposterior akinesis   . Myocardial infarction ([place])    x2  . Noncompliance   . Obesity   . OSA (obstructive sleep apnea)    dx 2012  severe osa  no cpap just uses nose strips  . PAF (paroxysmal atrial fibrillation) ([place])    first dx 01-16-2017  . Polymyalgia rheumatica ([place])   . S/P drug eluting coronary stent placement    total 6 DES    ROS:   All systems reviewed and negative except as noted in the HPI.   Past Surgical History:  Procedure Laterality Date  . CARDIAC CATHETERIZATION  01/24/2013  . CARDIAC CATHETERIZATION N/A 11/14/2016   Procedure: Right/Left Heart Cath and Coronary Angiography;  Surgeon: Larey Dresser, MD;  Location: [place] CV LAB;  Service: Cardiovascular;  Laterality: N/A;  . CARDIAC CATHETERIZATION N/A 11/17/2016   Procedure: Coronary Stent Intervention w/Impella;  Surgeon: Peter M Martinique, MD;  Location: [place] CV LAB;  Service: Cardiovascular;  Laterality: N/A;  . CARDIAC CATHETERIZATION N/A 11/17/2016   Procedure: Coronary Atherectomy;  Surgeon: Peter M Martinique, MD;  Location: [place] CV LAB;  Service: Cardiovascular;  Laterality: N/A;  . CORONARY ANGIOPLASTY WITH STENT PLACEMENT  01-30-2011   dr Aundra Dubin     PTCA and multiple overlapping DEStenting to RCA and 1 DES to RI   . CORONARY BALLOON ANGIOPLASTY N/A 07/06/2017   Procedure: Coronary Balloon Angioplasty;  Surgeon: Sherren Mocha, MD;  Location: [place] CV LAB;  Service: Cardiovascular;  Laterality: N/A;  . CYSTOSCOPY WITH FULGERATION N/A 05/25/2018   Procedure: CYSTOSCOPY WITH FULGERATION/BLADDER BIOPSY , 15 mm lesion;  Surgeon: Festus Aloe, MD;  Location: [facility];  Service: Urology;  Laterality: N/A;  . I&D EXTREMITY  06/15/2012   Procedure: IRRIGATION AND DEBRIDEMENT EXTREMITY;  Surgeon: Newt Minion, MD;  Location: [place];  Service: Orthopedics;  Laterality: Left;  I&D Left Posterior Knee  . I&D EXTREMITY  06/30/2012   Procedure: IRRIGATION AND DEBRIDEMENT EXTREMITY;  Surgeon:  Newt Minion, MD;  Location: [place];  Service: Orthopedics;  Laterality: Left;  Left Leg Irrigation and Debridement and placement of Wound VAC and application of  A-cell  . I&D EXTREMITY  07/20/2012   Procedure: IRRIGATION AND DEBRIDEMENT EXTREMITY;  Surgeon: Newt Minion, MD;  Location: [place];  Service: Orthopedics;  Laterality: Left;  Irrigation and Debridement Left Leg and Place antibiotic beads   . ICD IMPLANT N/A 02/12/2017   Procedure: ICD Implant;  Surgeon: Evans Lance, MD;  Location: [place] CV LAB;  Service: Cardiovascular;  Laterality: N/A;  . LAPAROSCOPIC INGUINAL HERNIA REPAIR Right 09-11-2008   dr Barry Dienes  [facility]  . LEAD REVISION/REPAIR N/A 05/26/2017   Procedure: Lead Revision/Repair;  Surgeon: Evans Lance, MD;  Location: [place] CV LAB;  Service: Cardiovascular;  Laterality: N/A;  . LEFT HEART CATH AND CORONARY ANGIOGRAPHY N/A 07/06/2017   Procedure: Left Heart Cath and Coronary Angiography;  Surgeon: Larey Dresser, MD;  Location: [place] CV LAB;  Service: Cardiovascular;  Laterality: N/A;  . PERCUTANEOUS CORONARY STENT INTERVENTION (PCI-S) N/A 01/24/2013   Procedure: PERCUTANEOUS CORONARY STENT INTERVENTION (PCI-S);  Surgeon:  Wellington Hampshire, MD;  Location[facility] C[facility];  Service: Cardiovascular;  Laterality: N/A;  . RADIOACTIVE PROSTATE SEED IMPLANTS  11-24-2008   dr Tresa Endo  [facility]  . TRANSURETHRAL RESECTION OF BLADDER TUMOR N/A 10/30/2017   Procedure: TRANSURETHRAL RESECTION OF BLADDER TUMOR (TURBT)/ INSTILLATION OF EPIRUBICIN;  Surgeon: Festus Aloe, MD;  [facility];  Service: Urology;  Laterality: N/A;  . ULTRASOUND GUIDANCE FOR VASCULAR ACCESS  11/14/2016   Procedure: Ultrasound Guidance For Vascular Access;  Surgeon: Larey Dresser, MD;  Location: [place] CV LAB;  Service: Cardiovascular;;  . Stephanie Coup ABLATION N/A 06/19/2017   Procedure: Stephanie Coup Ablation;  Surgeon: Evans Lance, MD;  Location: [place] CV LAB;  Service: Cardiovascular;  Laterality: N/A;     Family History  Problem Relation Age of Onset  . Cancer Mother 74       unknown CA  . Heart disease Father   . Alcohol abuse Father   . Heart attack Father 2  . Heart attack Brother      Social History   Socioeconomic History  . Marital status: Married    Spouse name: Not on file  . Number of children: 1  . Years of education: Not on file  . Highest education level: Not on file  Occupational History  . Occupation: RETIRED    Employer: RETIRED    Comment: TRUCK DRIVER  Tobacco Use  . Smoking status: Former Smoker    Packs/day: 0.30    Years: 20.00    Pack years: 6.00    Types: Cigarettes    Quit date: 02/23/1989    Years since quitting: 30.7  . Smokeless tobacco: Never Used  Substance and Sexual Activity  . Alcohol use: No  . Drug use: No  . Sexual activity: Yes  Other Topics Concern  . Not on file  Social History Narrative   Retired Administrator   Social Determinants of Radio broadcast assistant Strain:   . Difficulty of Paying Living Expenses: Not on file  Food Insecurity:   . Worried About Charity fundraiser in the Last Year: Not on file  . Ran Out of Food in the Last Year: Not on file    Transportation Needs:   . Lack of Transportation (Medical): Not on file  . Lack of  Transportation (Non-Medical): Not on file  Physical Activity:   . Days of Exercise per Week: Not on file  . Minutes of Exercise per Session: Not on file  Stress:   . Feeling of Stress : Not on file  Social Connections:   . Frequency of Communication with Friends and Family: Not on file  . Frequency of Social Gatherings with Friends and Family: Not on file  . Attends Religious Services: Not on file  . Active Member of Clubs or Organizations: Not on file  . Attends Archivist Meetings: Not on file  . Marital Status: Not on file  Intimate Partner Violence:   . Fear of Current or Ex-Partner: Not on file  . Emotionally Abused: Not on file  . Physically Abused: Not on file  . Sexually Abused: Not on file     BP 128/70   Pulse 68   Ht 5\' 8"  (1.727 m)   Wt 239 lb (108.4 kg)   SpO2 96%   BMI 36.34 kg/m   Physical Exam:  Well appearing NAD HEENT: Unremarkable Neck:  No JVD, no thyromegally Lymphatics:  No adenopathy Back:  No CVA tenderness Lungs:  Clear with no wheezes HEART:  Regular rate rhythm, no murmurs, no rubs, no clicks Abd:  soft, positive bowel sounds, no organomegally, no rebound, no guarding Ext:  2 plus pulses, no edema, no cyanosis, no clubbing Skin:  No rashes no nodules Neuro:  CN II through XII intact, motor grossly intact  EKG - NSR with IVCD  DEVICE  Normal device function.  See PaceArt for details.   Assess/Plan: 1. VF - he has had no recurrent symptoms. He will continue amiodarone. 2. ICD -his medtronic ICD is working normally. 3. Chronic systolic heart failure - his symptoms are class 2. He will continue his current meds.  Mikle [email or web address].D.

## 2019-11-30 NOTE — Patient Instructions (Signed)
Medication Instructions:  Your physician recommends that you continue on your current medications as directed. Please refer to the Current Medication list given to you today.  *If you need a refill on your cardiac medications before your next appointment, please call your pharmacy*  Lab Work: None ordered.  If you have labs (blood work) drawn today and your tests are completely normal, you will receive your results only by: Marland Kitchen MyChart Message (if you have MyChart) OR . A paper copy in the mail If you have any lab test that is abnormal or we need to change your treatment, we will call you to review the results.  Testing/Procedures: None ordered. .  At [facility], you and your health needs are our priority.  As part of our continuing mission to provide you with exceptional heart care, we have created designated Provider Care Teams.  These Care Teams include your primary Cardiologist (physician) and Advanced Practice Providers (APPs -  Physician Assistants and Nurse Practitioners) who all work together to provide you with the care you need, when you need it.  Your next appointment:  Your physician wants you to follow-up in: one year with Dr Knox Saliva will receive a reminder letter in the mail two months in advance. If you don't receive a letter, please call our office to schedule the follow-up appointment.   Remote monitoring is used to monitor your  ICD from home. This monitoring reduces the number of office visits required to check your device to one time per year. It allows [place] to keep an eye on the functioning of your device to ensure it is working properly. You are scheduled for a device check from home on 12/15/2019. You may send your transmission at any time that day. If you have a wireless device, the transmission will be sent automatically. After your physician reviews your transmission, you will receive a postcard with your next transmission date.

## 2019-12-01 ENCOUNTER — Other Ambulatory Visit: Payer: Self-pay | Admitting: Family Medicine

## 2019-12-02 ENCOUNTER — Telehealth: Payer: Self-pay

## 2019-12-02 DIAGNOSIS — E1140 Type 2 diabetes mellitus with diabetic neuropathy, unspecified: Secondary | ICD-10-CM

## 2019-12-02 DIAGNOSIS — IMO0002 Reserved for concepts with insufficient information to code with codable children: Secondary | ICD-10-CM

## 2019-12-02 MED ORDER — INSULIN DETEMIR 100 UNIT/ML FLEXPEN: SUBCUTANEOUS | 5 refills | Status: DC

## 2019-12-02 MED ORDER — ACCU-CHEK AVIVA PLUS VI STRP: ORAL_STRIP | 1 refills | Status: DC

## 2019-12-02 NOTE — Telephone Encounter (Signed)
Spoke to pt and he stated that he wanted to give updated blood sugar levels for a month. Pt stated that his blood sugars have been around 130-140 ranges and one at 88. Pt stated he noticed the highest readings are after dinner. Pt is only taking his insulin at night and checks it in the a.m. Pt stated if its high he will take a little more insulin. Pt is taking 10 units at night and if its elevated he does 12 units. Pt wants to know what would you like him to stay on current insulin levels? Pt is running low on medication and would like something called in. Pt stated he only has 3 to 4 days left.

## 2019-12-02 NOTE — Telephone Encounter (Signed)
He is overdue for A1C.    Refill Levemir for 6 months.  If fasting glucose consistently < 130 would leave Levemir dose same until A1C.   See if he is willing to come in for lab only A1C.

## 2019-12-02 NOTE — Telephone Encounter (Signed)
Copied from [place] [id number]. Topic: General - Inquiry >> Dec 02, 2019 10:57 AM Alease Frame wrote: Reason for CRM: Patient is wanting a call back from Dr Anastasio Auerbach nurse . Please advise

## 2019-12-02 NOTE — Telephone Encounter (Signed)
Medication has been sent in and lab appt arranged

## 2019-12-02 NOTE — Addendum Note (Signed)
Addended by: Modena Morrow R on: 12/02/2019 02:27 PM   Modules accepted: Orders

## 2019-12-05 ENCOUNTER — Other Ambulatory Visit: Payer: Medicare Other

## 2019-12-12 ENCOUNTER — Other Ambulatory Visit (INDEPENDENT_AMBULATORY_CARE_PROVIDER_SITE_OTHER): Payer: Medicare Other

## 2019-12-12 ENCOUNTER — Other Ambulatory Visit: Payer: Self-pay

## 2019-12-12 DIAGNOSIS — E1165 Type 2 diabetes mellitus with hyperglycemia: Secondary | ICD-10-CM | POA: Diagnosis not present

## 2019-12-12 DIAGNOSIS — IMO0002 Reserved for concepts with insufficient information to code with codable children: Secondary | ICD-10-CM

## 2019-12-12 DIAGNOSIS — E1140 Type 2 diabetes mellitus with diabetic neuropathy, unspecified: Secondary | ICD-10-CM | POA: Diagnosis not present

## 2019-12-12 DIAGNOSIS — C670 Malignant neoplasm of trigone of bladder: Secondary | ICD-10-CM | POA: Diagnosis not present

## 2019-12-12 LAB — HEMOGLOBIN A1C: Hgb A1c MFr Bld: 10.8 % — ABNORMAL HIGH (ref 4.6–6.5)

## 2019-12-15 ENCOUNTER — Ambulatory Visit (INDEPENDENT_AMBULATORY_CARE_PROVIDER_SITE_OTHER): Payer: Medicare Other | Admitting: *Deleted

## 2019-12-15 DIAGNOSIS — I469 Cardiac arrest, cause unspecified: Secondary | ICD-10-CM

## 2019-12-15 LAB — CUP PACEART REMOTE DEVICE CHECK
Battery Remaining Longevity: 97 mo
Battery Voltage: 2.99 V
Brady Statistic RV Percent Paced: 0.01 %
Date Time Interrogation Session: 20201231153650
HighPow Impedance: 71 Ohm
Implantable Lead Implant Date: 20180612
Implantable Lead Location: 753860
Implantable Lead Model: 6935
Implantable Pulse Generator Implant Date: 20180103
Lead Channel Impedance Value: 380 Ohm
Lead Channel Impedance Value: 456 Ohm
Lead Channel Pacing Threshold Amplitude: 1 V
Lead Channel Pacing Threshold Pulse Width: 0.4 ms
Lead Channel Sensing Intrinsic Amplitude: 15.25 mV
Lead Channel Sensing Intrinsic Amplitude: 15.25 mV
Lead Channel Setting Pacing Amplitude: 2.5 V
Lead Channel Setting Pacing Pulse Width: 0.4 ms
Lead Channel Setting Sensing Sensitivity: 0.3 mV

## 2019-12-16 NOTE — Progress Notes (Signed)
ICD remote 

## 2019-12-20 ENCOUNTER — Other Ambulatory Visit: Payer: Self-pay

## 2019-12-20 ENCOUNTER — Telehealth (INDEPENDENT_AMBULATORY_CARE_PROVIDER_SITE_OTHER): Payer: Medicare Other | Admitting: Family Medicine

## 2019-12-20 DIAGNOSIS — E1140 Type 2 diabetes mellitus with diabetic neuropathy, unspecified: Secondary | ICD-10-CM

## 2019-12-20 DIAGNOSIS — E1165 Type 2 diabetes mellitus with hyperglycemia: Secondary | ICD-10-CM

## 2019-12-20 DIAGNOSIS — IMO0002 Reserved for concepts with insufficient information to code with codable children: Secondary | ICD-10-CM

## 2019-12-20 NOTE — Progress Notes (Signed)
[email or web address]  Virtual Visit via Telephone Note  I connected with Wayne [name] on 12/20/19 at  1:15 PM EST by telephone and verified that I am speaking with the correct person using two identifiers.   I discussed the limitations, risks, security and privacy concerns of performing an evaluation and management service by telephone and the availability of in person appointments. I also discussed with the patient that there may be a patient responsible charge related to this service. The patient expressed understanding and agreed to proceed.  Location patient: home Location provider: work or home office Participants present for the call: patient, provider Patient did not have a visit in the prior 7 days to address this/these issue(s).   History of Present Illness: Wayne [name] has multiple chronic problems.  He is very poorly controlled diabetes.  We had recently added Levemir but he has been somewhat inconsistent with taking and had not titrated up as we had previously advised.  He had recent A1c last week which came back 10.8%.  He admits he has been poorly compliant with diet.  Recent fasting sugars over the past week ranging around 170-180.  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 fib, obstructive sleep apnea, hypertension  Past Medical History:  Diagnosis Date  . AICD (automatic cardioverter/defibrillator) present   . Arthritis   . Cancer [facility])    bladder and prostate  . Cellulitis of left leg    a. 0000000 complicated by septic shock  . Chronic systolic CHF (congestive heart failure) ([place])   . CKD (chronic kidney disease), stage III    pt denies  . Colon polyps   . CORONARY ATHEROSCLEROSIS NATIVE CORONARY ARTERY cardiologist-  dr Aundra Dubin   a. 01/2011 Cath/PCI: LM nl, LAD 40-50p, D1 80-small, LCX 95-small, RI 90, RCA 100, EF 20%;  b. 01/2011 Card MRI - No transmural scar;  c.  01/2011 PCI RCA->5 Promus DES, RI->3.0x16 Promus DES; d. Cath 01/13/13 patent LAD & Ramus, diffuse LCx dz, RCA mult overlapping stents w/ 95% osital stenosis, EF 20%, s/p DES to ostial/prox RCA 01/24/13   . Diabetes mellitus, type 2 ([employer])    type 2  . GERD (gastroesophageal reflux disease)   . Hematoma of leg    a. left leg hematoma 03/2012 in the setting of asa/effient  . Herpes zoster ophthalmicus   . History of non-ST elevation myocardial infarction (NSTEMI) 06/2012  . History of prostate cancer    dx 2009---  s/p radioactive seed implants 11-24-2008  . HYPERLIPIDEMIA    intolerant to Lipitor (myalgias)  . HYPERTENSION   . Ischemic cardiomyopathy    a. 01/2012 Echo EF 45%, mild LVH; b. [id number], grade 1 diastolic dysfunction, diffuse hypokinesis, inferoposterior akinesis   . Myocardial infarction ([place])    x2  . Noncompliance   . Obesity   . OSA (obstructive sleep apnea)    dx 2012  severe osa  no cpap just uses nose strips  . PAF (paroxysmal atrial fibrillation) ([place])    first dx 01-16-2017  . Polymyalgia rheumatica ([place])   . S/P drug eluting coronary stent placement    total 6 DES   Past Surgical History:  Procedure Laterality Date  . CARDIAC CATHETERIZATION  01/24/2013  . CARDIAC CATHETERIZATION N/A 11/14/2016   Procedure: Right/Left Heart Cath and Coronary Angiography;  Surgeon: Larey Dresser, MD;  Location: [place] [facility];  Service: Cardiovascular;  Laterality: N/A;  .  CARDIAC CATHETERIZATION N/A 11/17/2016   Procedure: Coronary Stent Intervention w/Impella;  Surgeon: Peter M Martinique, MD;  Location: [place] [facility];  Service: Cardiovascular;  Laterality: N/A;  . CARDIAC CATHETERIZATION N/A 11/17/2016   Procedure: Coronary Atherectomy;  Surgeon: Peter M Martinique, MD;  Location: [place] [facility];  Service: Cardiovascular;  Laterality: N/A;  . CORONARY ANGIOPLASTY WITH STENT PLACEMENT  01-30-2011   dr Aundra Dubin   PTCA and multiple overlapping DEStenting to RCA and 1 DES to RI   .  CORONARY BALLOON ANGIOPLASTY N/A 07/06/2017   Procedure: Coronary Balloon Angioplasty;  Surgeon: Sherren Mocha, MD;  Location: [place] [facility];  Service: Cardiovascular;  Laterality: N/A;  . CYSTOSCOPY WITH FULGERATION N/A 05/25/2018   Procedure: CYSTOSCOPY WITH FULGERATION/BLADDER BIOPSY , 15 mm lesion;  Surgeon: Festus Aloe, MD;  Location: [facility];  Service: Urology;  Laterality: N/A;  . I & D EXTREMITY  06/15/2012   Procedure: IRRIGATION AND DEBRIDEMENT EXTREMITY;  Surgeon: Newt Minion, MD;  Location: [place];  Service: Orthopedics;  Laterality: Left;  I&D Left Posterior Knee  . I & D EXTREMITY  06/30/2012   Procedure: IRRIGATION AND DEBRIDEMENT EXTREMITY;  Surgeon: Newt Minion, MD;  Location: [place];  Service: Orthopedics;  Laterality: Left;  Left Leg Irrigation and Debridement and placement of Wound VAC and application of  A-cell  . I & D EXTREMITY  07/20/2012   Procedure: IRRIGATION AND DEBRIDEMENT EXTREMITY;  Surgeon: Newt Minion, MD;  Location: [place];  Service: Orthopedics;  Laterality: Left;  Irrigation and Debridement Left Leg and Place antibiotic beads   . ICD IMPLANT N/A 02/12/2017   Procedure: ICD Implant;  Surgeon: Evans Lance, MD;  Location: [place] [facility];  Service: Cardiovascular;  Laterality: N/A;  . LAPAROSCOPIC INGUINAL HERNIA REPAIR Right 09-11-2008   dr Barry Dienes  [facility]  . LEAD REVISION/REPAIR N/A 05/26/2017   Procedure: Lead Revision/Repair;  Surgeon: Evans Lance, MD;  Location: [place] [facility];  Service: Cardiovascular;  Laterality: N/A;  . LEFT HEART CATH AND CORONARY ANGIOGRAPHY N/A 07/06/2017   Procedure: Left Heart Cath and Coronary Angiography;  Surgeon: Larey Dresser, MD;  Location: [place] [facility];  Service: Cardiovascular;  Laterality: N/A;  . PERCUTANEOUS CORONARY STENT INTERVENTION (PCI-S) N/A 01/24/2013   Procedure: PERCUTANEOUS CORONARY STENT INTERVENTION (PCI-S);  Surgeon: Wellington Hampshire, MD;  Location: [facility];  Service: Cardiovascular;   Laterality: N/A;  . RADIOACTIVE PROSTATE SEED IMPLANTS  11-24-2008   dr Tresa Endo  [facility]  . TRANSURETHRAL RESECTION OF BLADDER TUMOR N/A 10/30/2017   Procedure: TRANSURETHRAL RESECTION OF BLADDER TUMOR (TURBT)/ INSTILLATION OF EPIRUBICIN;  Surgeon: Festus Aloe, MD;  [facility];  Service: Urology;  Laterality: N/A;  . ULTRASOUND GUIDANCE FOR VASCULAR ACCESS  11/14/2016   Procedure: Ultrasound Guidance For Vascular Access;  Surgeon: Larey Dresser, MD;  Location: [place] [facility];  Service: Cardiovascular;;  . Stephanie Coup ABLATION N/A 06/19/2017   Procedure: Stephanie Coup Ablation;  Surgeon: Evans Lance, MD;  Location: [place] [facility];  Service: Cardiovascular;  Laterality: N/A;    reports that he quit smoking about 30 years ago. His smoking use included cigarettes. He has a 6.00 pack-year smoking history. He has never used smokeless tobacco. He reports that he does not drink alcohol or use drugs. family history includes Alcohol abuse in his father; Cancer (age of onset: 74) in his mother; Heart attack in his brother; Heart attack (age of onset:  31) in his father; Heart disease in his father. Allergies  Allergen Reactions  . Entresto [Sacubitril-Valsartan] Swelling and Other (See Comments)    Angioedema  . Crestor [Rosuvastatin Calcium] Other (See Comments)    Myalgia, Interfering with Gait in high doses  . Lipitor [Atorvastatin] Other (See Comments)    Makes legs sore  . Plavix [Clopidogrel] Hives, Itching, Swelling and Other (See Comments)    Nose bleeds and welts on legs & back  . Ancef [Cefazolin] Itching, Rash and Other (See Comments)    Describes itching and rash, but said that "it wasn't that bad"      Observations/Objective: Patient sounds cheerful and well on the phone. I do not appreciate any SOB. Speech and thought processing are grossly intact. Patient reported vitals:  Assessment and Plan:  Type 2 diabetes poorly controlled with recent A1c 10.8%  -Tighten up  diet -We again strongly advised titrating his Levemir up from 10 units daily by increasing 2 units every 3 days until fasting blood sugars consistently 130 or less -We discussed possible mealtime insulin such as Humalog or NovoLog but he declines at this time. -He is not a candidate for medication such as Actos and not a great candidate for Metformin because of his severe heart failure -He did have decent renal function by most recent labs and might be a candidate for medication such as SGLT2 medication but cost may be a deterrent.  Follow Up Instructions:  -3 months with dietary modification and titration of Levemir as above   [id number] 5-10 [id number] 11-20 [id number] 21-30 I did not refer this patient for an OV in the next 24 hours for this/these issue(s).  I discussed the assessment and treatment plan with the patient. The patient was provided an opportunity to ask questions and all were answered. The patient agreed with the plan and demonstrated an understanding of the instructions.   The patient was advised to call back or seek an in-person evaluation if the symptoms worsen or if the condition fails to improve as anticipated.  I provided 23 minutes of non-face-to-face time during this encounter.   Carolann Littler, MD

## 2019-12-26 ENCOUNTER — Telehealth: Payer: Self-pay

## 2019-12-26 NOTE — Telephone Encounter (Signed)
I called the pt to see if he was feeling okay since his monitor sent an unscheduled transmission. The pt states he felt well today. I told him the nurse will review the transmission if everything looks well he will not get a call back. If the transmission show something the nurse will give him a call back. I told him no news is good news.

## 2019-12-26 NOTE — Telephone Encounter (Signed)
Transmission showed device function WNL. No episodes  Recorded of atrial or ventricular arrhythmias.

## 2020-01-04 ENCOUNTER — Other Ambulatory Visit (HOSPITAL_COMMUNITY): Payer: Self-pay

## 2020-01-04 ENCOUNTER — Other Ambulatory Visit: Payer: Self-pay | Admitting: Internal Medicine

## 2020-01-04 MED ORDER — LOSARTAN POTASSIUM 25 MG PO TABS: ORAL_TABLET | ORAL | 0 refills | Status: DC

## 2020-01-04 MED ORDER — ROSUVASTATIN CALCIUM 10 MG PO TABS: 10.0000 mg | ORAL_TABLET | Freq: Every day | ORAL | 0 refills | Status: DC

## 2020-01-04 MED ORDER — RIVAROXABAN 20 MG PO TABS: 20.0000 mg | ORAL_TABLET | Freq: Every day | ORAL | 1 refills | Status: DC

## 2020-02-01 ENCOUNTER — Telehealth (HOSPITAL_COMMUNITY): Payer: Self-pay | Admitting: Pharmacist

## 2020-02-01 NOTE — Telephone Encounter (Signed)
Received message from Wayne [name] asking if he could use acetaminophen for his arthritis pain. Left VM informing patient that acetaminophen was ok to take for his pain, but to use no more than 4000 mg total of acetaminophen per day. Higher doses puts him at risk for liver toxicity. He is also taking Tylenol PM, which also has acetaminophen in it.   Audry Riles, PharmD, BCPS, BCCP, CPP [facility] Failure [facility] Pharmacist [phone]

## 2020-02-08 ENCOUNTER — Other Ambulatory Visit (HOSPITAL_COMMUNITY): Payer: Self-pay

## 2020-02-13 ENCOUNTER — Other Ambulatory Visit (HOSPITAL_COMMUNITY): Payer: Self-pay | Admitting: *Deleted

## 2020-02-13 MED ORDER — FUROSEMIDE 20 MG PO TABS: ORAL_TABLET | ORAL | 3 refills | Status: DC

## 2020-02-23 ENCOUNTER — Other Ambulatory Visit (HOSPITAL_COMMUNITY): Payer: Self-pay

## 2020-02-23 DIAGNOSIS — I5022 Chronic systolic (congestive) heart failure: Secondary | ICD-10-CM

## 2020-02-23 MED ORDER — CARVEDILOL 12.5 MG PO TABS: 12.5000 mg | ORAL_TABLET | Freq: Two times a day (BID) | ORAL | 0 refills | Status: DC

## 2020-03-15 ENCOUNTER — Other Ambulatory Visit: Payer: Self-pay | Admitting: Family Medicine

## 2020-03-15 ENCOUNTER — Ambulatory Visit (INDEPENDENT_AMBULATORY_CARE_PROVIDER_SITE_OTHER): Payer: Medicare Other | Admitting: *Deleted

## 2020-03-15 DIAGNOSIS — I469 Cardiac arrest, cause unspecified: Secondary | ICD-10-CM

## 2020-03-15 LAB — CUP PACEART REMOTE DEVICE CHECK
Battery Remaining Longevity: 94 mo
Battery Voltage: 3 V
Brady Statistic RV Percent Paced: 0.01 %
Date Time Interrogation Session: 20210401033323
HighPow Impedance: 77 Ohm
Implantable Lead Implant Date: 20180612
Implantable Lead Location: 753860
Implantable Lead Model: 6935
Implantable Pulse Generator Implant Date: 20180103
Lead Channel Impedance Value: 342 Ohm
Lead Channel Impedance Value: 437 Ohm
Lead Channel Pacing Threshold Amplitude: 0.875 V
Lead Channel Pacing Threshold Pulse Width: 0.4 ms
Lead Channel Sensing Intrinsic Amplitude: 15.125 mV
Lead Channel Sensing Intrinsic Amplitude: 15.125 mV
Lead Channel Setting Pacing Amplitude: 2.5 V
Lead Channel Setting Pacing Pulse Width: 0.4 ms
Lead Channel Setting Sensing Sensitivity: 0.3 mV

## 2020-03-16 NOTE — Progress Notes (Signed)
ICD Remote  

## 2020-03-19 ENCOUNTER — Other Ambulatory Visit (HOSPITAL_COMMUNITY): Payer: Self-pay

## 2020-03-19 DIAGNOSIS — I5022 Chronic systolic (congestive) heart failure: Secondary | ICD-10-CM

## 2020-03-19 MED ORDER — CARVEDILOL 12.5 MG PO TABS: 12.5000 mg | ORAL_TABLET | Freq: Two times a day (BID) | ORAL | 0 refills | Status: DC

## 2020-03-26 ENCOUNTER — Telehealth: Payer: Self-pay | Admitting: Family Medicine

## 2020-03-26 NOTE — Progress Notes (Signed)
Scheduled a call back, patient was not available to take the call. Family member said they would have him call back.     Raynicia Dukes [employer] Scheduler

## 2020-03-27 ENCOUNTER — Telehealth: Payer: Self-pay | Admitting: Family Medicine

## 2020-03-27 NOTE — Progress Notes (Signed)
Patient returned my call. Scheduled a call back in 2 months. Patient has health issues going on right now. Patient stated he prefers to wait until those issues are dealt with first.      Raynicia Dukes [employer] Scheduler

## 2020-04-03 ENCOUNTER — Other Ambulatory Visit: Payer: Self-pay | Admitting: Internal Medicine

## 2020-04-10 ENCOUNTER — Telehealth (HOSPITAL_COMMUNITY): Payer: Self-pay | Admitting: Pharmacist

## 2020-04-10 DIAGNOSIS — I5022 Chronic systolic (congestive) heart failure: Secondary | ICD-10-CM

## 2020-04-10 MED ORDER — CARVEDILOL 12.5 MG PO TABS: 12.5000 mg | ORAL_TABLET | Freq: Two times a day (BID) | ORAL | 1 refills | Status: DC

## 2020-04-17 ENCOUNTER — Other Ambulatory Visit: Payer: Self-pay

## 2020-04-17 ENCOUNTER — Encounter (HOSPITAL_COMMUNITY): Payer: Self-pay

## 2020-04-17 ENCOUNTER — Ambulatory Visit (HOSPITAL_COMMUNITY)
Admission: RE | Admit: 2020-04-17 | Discharge: 2020-04-17 | Disposition: A | Discharge status: Home / Self Care | Payer: Medicare Other | Source: Ambulatory Visit | Attending: Internal Medicine | Admitting: Internal Medicine

## 2020-04-17 VITALS — BP 116/74 | HR 66 | Wt 242.2 lb

## 2020-04-17 DIAGNOSIS — I5022 Chronic systolic (congestive) heart failure: Secondary | ICD-10-CM | POA: Insufficient documentation

## 2020-04-17 DIAGNOSIS — I2510 Atherosclerotic heart disease of native coronary artery without angina pectoris: Secondary | ICD-10-CM | POA: Diagnosis not present

## 2020-04-17 DIAGNOSIS — E1151 Type 2 diabetes mellitus with diabetic peripheral angiopathy without gangrene: Secondary | ICD-10-CM | POA: Diagnosis not present

## 2020-04-17 DIAGNOSIS — I255 Ischemic cardiomyopathy: Secondary | ICD-10-CM

## 2020-04-17 DIAGNOSIS — N1831 Chronic kidney disease, stage 3a: Secondary | ICD-10-CM | POA: Diagnosis not present

## 2020-04-17 DIAGNOSIS — Z794 Long term (current) use of insulin: Secondary | ICD-10-CM | POA: Insufficient documentation

## 2020-04-17 DIAGNOSIS — Z87891 Personal history of nicotine dependence: Secondary | ICD-10-CM | POA: Diagnosis not present

## 2020-04-17 DIAGNOSIS — Z7901 Long term (current) use of anticoagulants: Secondary | ICD-10-CM | POA: Insufficient documentation

## 2020-04-17 DIAGNOSIS — N183 Chronic kidney disease, stage 3 unspecified: Secondary | ICD-10-CM | POA: Insufficient documentation

## 2020-04-17 DIAGNOSIS — G4733 Obstructive sleep apnea (adult) (pediatric): Secondary | ICD-10-CM | POA: Diagnosis not present

## 2020-04-17 DIAGNOSIS — Z9079 Acquired absence of other genital organ(s): Secondary | ICD-10-CM | POA: Insufficient documentation

## 2020-04-17 DIAGNOSIS — Z955 Presence of coronary angioplasty implant and graft: Secondary | ICD-10-CM | POA: Insufficient documentation

## 2020-04-17 DIAGNOSIS — E785 Hyperlipidemia, unspecified: Secondary | ICD-10-CM | POA: Diagnosis not present

## 2020-04-17 DIAGNOSIS — I130 Hypertensive heart and chronic kidney disease with heart failure and stage 1 through stage 4 chronic kidney disease, or unspecified chronic kidney disease: Secondary | ICD-10-CM | POA: Diagnosis not present

## 2020-04-17 DIAGNOSIS — Z95810 Presence of automatic (implantable) cardiac defibrillator: Secondary | ICD-10-CM | POA: Insufficient documentation

## 2020-04-17 DIAGNOSIS — Z8546 Personal history of malignant neoplasm of prostate: Secondary | ICD-10-CM | POA: Insufficient documentation

## 2020-04-17 DIAGNOSIS — I480 Paroxysmal atrial fibrillation: Secondary | ICD-10-CM | POA: Diagnosis not present

## 2020-04-17 DIAGNOSIS — Z8674 Personal history of sudden cardiac arrest: Secondary | ICD-10-CM | POA: Insufficient documentation

## 2020-04-17 DIAGNOSIS — E1122 Type 2 diabetes mellitus with diabetic chronic kidney disease: Secondary | ICD-10-CM | POA: Insufficient documentation

## 2020-04-17 DIAGNOSIS — C679 Malignant neoplasm of bladder, unspecified: Secondary | ICD-10-CM | POA: Insufficient documentation

## 2020-04-17 DIAGNOSIS — Z79899 Other long term (current) drug therapy: Secondary | ICD-10-CM | POA: Insufficient documentation

## 2020-04-17 MED ORDER — CARVEDILOL 12.5 MG PO TABS: 12.5000 mg | ORAL_TABLET | Freq: Two times a day (BID) | ORAL | 3 refills | Status: DC

## 2020-04-17 MED ORDER — ROSUVASTATIN CALCIUM 10 MG PO TABS: 10.0000 mg | ORAL_TABLET | Freq: Every day | ORAL | 3 refills | Status: DC

## 2020-04-17 MED ORDER — RIVAROXABAN 20 MG PO TABS: 20.0000 mg | ORAL_TABLET | Freq: Every day | ORAL | 3 refills | Status: DC

## 2020-04-17 MED ORDER — SPIRONOLACTONE 25 MG PO TABS: 12.5000 mg | ORAL_TABLET | Freq: Every day | ORAL | 6 refills | Status: DC

## 2020-04-17 NOTE — Patient Instructions (Signed)
RESTART Spironolactone 12.5 mg, one half tab daily  Labs needed in 7-10 days  Your physician recommends that you schedule a follow-up appointment in: 3 months with Dr Aundra Dubin and echo  Your physician has requested that you have an echocardiogram. Echocardiography is a painless test that uses sound waves to create images of your heart. It provides your doctor with information about the size and shape of your heart and how well your heart's chambers and valves are working. This procedure takes approximately one hour. There are no restrictions for this procedure.   Do the following things EVERYDAY: 1) Weigh yourself in the morning before breakfast. Write it down and keep it in a log. 2) Take your medicines as prescribed 3) Eat low salt foods--Limit salt (sodium) to 2000 mg per day.  4) Stay as active as you can everyday 5) Limit all fluids for the day to less than 2 liters  At the [facility], you and your health needs are our priority. As part of our continuing mission to provide you with exceptional heart care, we have created designated Provider Care Teams. These Care Teams include your primary Cardiologist (physician) and Advanced Practice Providers (APPs- Physician Assistants and Nurse Practitioners) who all work together to provide you with the care you need, when you need it.   You may see any of the following providers on your designated Care Team at your next follow up: Marland Kitchen Dr Glori Bickers . Dr Loralie Champagne . Darrick Grinder, NP . Lyda Jester, PA . Audry Riles, PharmD   Please be sure to bring in all your medications bottles to every appointment.

## 2020-04-17 NOTE — Progress Notes (Signed)
Patient ID:   Advanced Heart Failure Clinic Note   Patient ID: Wayne [name], male   DOB: 05/01/1941, 79 y.o.   MRN: [id number] PCP: Dr. Elease Hashimoto Cardiology: Dr. Earline Mayotte Wayne [name] is a 79 y.o. male  with history of DM, HTN, CAD, and ischemic cardiomyopathy who presents for followup of CHF and CAD. Patient had a CHF exacerbation in 2/12 and was found to have LV systolic dysfunction with EF around 20%. LHC showed RCA, ramus, and CFX disease. RCA was subtotally occluded. Cardiac MRI showed that all walls, including the inferior wall, should be viable. Patient therefore underwent opening of his chronic totally occluded RCA as well as PCI to the ramus in 2/12. He received drug eluting stents and was on Effient.  He had an echo in 2/13 that showed EF improved to 45% with moderate LV dilation and mild LV hypertrophy.   In 5/13, he developed a large left lower leg hematoma.  He was still on Effient at that time.  ASA and Effient were stopped.  The hematoma did not resolve.  In 7/13, he was re-admitted with septic shock from MSSA from an abscess in his left gastrocnemius.  He also grew Pseudomonas from the left gastrocnemius as well.  He had a prolonged course in the hospital and later in a rehab facility.  Ultimately, he got back home again.  I had him get an echo in 1/14, and this showed EF 15% with diffuse hypokinesis and inferoposterior akinesis.  He had been off of most of his prior cardiac medications.  Took him back for [facility] in 1/14.  This showed subtotal occlusion of a small AV LCx and 95% ostial in-stent restenosis in the RCA.  He was treated in 2/14 with a Xience DES to the ostial RCA and begun on Plavix.  Unfortunately, he developed diffuse hives after starting Plavix and had to be switched to ticlopidine.  He has tolerated ticlopidine.   Echo done in 12/14 showed some improvement in LV function but EF was still low at 30-35%.  He did not want ICD.    Presented to ED 11/14/15 with worsening SOB after a  few steps and CXR with CHF and bilateral effusions in the setting of marked medical non-compliance. States he stopped taking his medicines in 01/2015 (except for ASA 81 and a multivitamin).  He was feeling good and decided that he did not need them anymore.  Also c/o URI symptoms. Diuresed over 2 L with IV diuretics in the ER and started on lasix 20 mg daily.   Echo (12/16) showed EF 20-25% with diffuse hypokinesis (down from 35% in 12/15).  Echo 10/17 showed EF 15% with mildly decreased RV systolic function.    He had left and right heart catheterization in 12/17. This showed elevated right and left heart filling pressures and low cardiac output. The RCA was totally occluded with collaterals, there was 95% ostial to mid LAD stenosis.  Patient had PCI with DES to proximal-mid LAD.    He was noted to be in atrial fibrillation with RVR in 2/18 at cardiac rehab. He felt fatigued.  He was admitted and started on amiodarone for rate control. ASA was stopped, ticagrelor was decreased to 60 mg bid, and Xarelto 15 mg daily was started.  He converted spontaneously back to NSR.    He suffered a ventricular fibrillation arrest in 3/18.  Luckily, he was wearing a Lifevest and was shocked.  He was admitted and started on amiodarone.  Medtronic ICD was placed.  Echo 3/18 with EF 20-25%.   Admitted again 6/18 due to VF arrest. ICD lead had been undersensing leading to prolongation of time to discharge.  He underwent RV lead extraction with new lead implant.   He was admitted again with VT storm in 7/18.  No prior chest pain or symptoms of worsening CAD.  He had VT ablation this admission.   Admitted with ICD shock 07/05/17. He had coronary angiography this admission.  Had cutting balloon angioplasty of severe in-stent restenosis in the proximal LAD. Pt had additional VT 07/07/17, EP saw and recommended BB titration. Po amio up-titrated. Discharge weight 228 lbs  He had TURBT 11/18, urothelial cancer diagnosed.     Today he returns for HF follow up. Last seen in 2019 . Overall feeling ok. He has had 3-4 falls over the last year. Not moving around much. Says he is limited by left foot pain. Using cane when he leaves the house.  Over the last few weeks he has noticed increased shortness of breath. SOB with exertion. Denies PND/Orthopnea. Appetite ok.  Eating taking out several times a week. No fever or chills. Weight at home trending up. Taking all medications but he says he stopped insulin.  He has called in spironolactone but says it was never filled. He has been off spiro for months.   Medtronic device interrogated: Fluid index below threshold. Impedance down. Activity 1-2 hours per day. No VT. No A fib.   Labs (12/16): K 4.9, creatinine 1.23 Labs (1/17): K 4.7, creatinine 1.15, BNP 1433 Labs (2/17): K 4.7, creatinine 1.14, BNP 870 Labs (3/17): K 4.5, creatinine 1.10, BNP 1257 Labs (5/17): K 4.2, creatinine 1.2, BNP 818 Labs (7/17): LDL 149, HDL 40, TGs 210 Labs (9/17): K 4.9, creatinine 1.36 Labs (10/17): K 3.7, creatinine 1.2, LDL 87, HDL 32, LFTs normal Labs (12/17): K 4.5, creatinine 1.34, digoxin 0.3 Labs (2/18): K 4.4, creatinine 1.48, hgb 11.8 Labs (3/18): K 5 => 4.6, creatinine 1.54 => 1.46, digoxin 1.3 => 0.3 Labs (5/18): TSH normal Labs (7/18): K 3.7, creatinine 1.33, hgb 11.4, digoxin 0.6, LFTs normal Labs (8/18): K 5.1, creatinine 1.47, hgb 13.3 Labs (10/18): LDL 50, HDL 40, TGs 247 Labs (11/18): K 5.9, creatinine 1.42, hgb 15.2 Labs (12/18): K 5, creatinine 1.27, digoxin 0.6, LFTs normal, TSH normal Labs (3/19): LDL 86, HDL 45 Labs (6/19): K 4.1, creatinine 1.34, LFTs normal  Past Medical History:  1. HYPERTENSION  2. HYPERLIPIDEMIA: Myalgias with atorvastatin and Crestor 3. ECZEMA  4. RHINITIS  5. HERPES ZOSTER OPHTHALMICUS  6. ADENOCARCINOMA, PROSTATE: Status post prostatectomy in 2009. Has had some incontinence since then.  7. Diabetes mellitus type II  8. Arthritis  9.  Obesity  10. GERD: rare  11. CAD: Presented with exertional dyspnea, never had chest pain. LHC (2/12) with subtotalled proximal RCA and left to right collaterals, 90% proximal moderate-sized ramus, 95% proximal relatively small CFX, 40-50% proximal LAD. Cardiac MRI (2/12) showed EF 21%, some mild scar in basal segments but all wall segments would be expected to be viable. DES x 5 (overlapping) to RCA, DES x 1 to RI 01/30/11 .  Bled into leg with Effient use (long, complicated course).  LHC (1/14): AV LCx small with subtotal occlusion, 95% ostial instent restenosis in RCA. PCI in 2/14 to ostial RCA with 3.5 x 15 Xience DES.  Plavix allergy (hives) so put on ticlopidine.  - LHC (12/17):  the RCA was totally occluded with collaterals, there  was 95% ostial to mid LAD stenosis, patient had PCI with DES to proximal-mid LAD. - LHC (7/18): Totally occluded RCA with left to right collaterals, subtotal occlusion of small AV LCx, 90% ISR proximal LAD => cutting balloon angioplasty.  12. Ischemic CMP: Echo (2/12) with moderately dilated LV, EF about 20% with diffuse hypokinesis and inferior akinesis, pseudonormal diastolic function, mild MR, severe LAE, mildly decreased RV systolic function. RHC (2/12) with mean RA 12, PA 40/25, mean PCWP 26, CI 2.1.  Echo (5/12) with EF 40% (appeared worse to my eye) with posterior HK, basal inferior AK, inferoseptal AK, basal anteroseptal AK, mild MR.  Cardiac MRI was repeated and showed EF 32% (improved from 21%) and mild LV dilation (was severely dilated before) with diffuse hypokinesis and subendocardial scar in the basal inferior, basal posterior, and basal anterolateral segments.  Echo (2/13) with EF 45%, moderate LV dilation, mild LVH.  Echo (1/14) with EF 15%, diffuse hypokinesis, inferoposterior akinesis, mild MR, grade I diastolic dysfunction.  Echo (5/14) with EF 30-35%, mild LV dilation, akinesis of the basal inferior wall otherwise diffuse hypokinesis.  Echo (12/14) with EF  30-35%, mild LV dilation, diffuse hypokinesis with basal inferior and posterior akinesis. Echo (12/15) with EF 35%, mildly dilated LV, wall motion abnormalities, normal RV size and systolic function. Echo (12/16) with EF 20-25%, diffuse hypokinesis, severe LV dilation, mild MR.  - Echo (10/17): EF 15%, grade II diastolic dysfunction, mild MR, mildly decreased RV systolic function.  - Possible angioedema related to Entresto.  - Hyperkalemia with spironolactone 12.5 daily.  - CPX (10/17): peak VO2 10.6, VE/VCO2 slope 48.6, RER 1.12.  Severe HF limitation.  - RHC (12/17): mean RA 14, PA 53/27, mean PCWP 25, CI 1.93.  - Echo (3/18): EF 20-25%, moderate LV dilation, mild LVH, normal RV size and systolic function, mild aortic stenosis.  - Medtronic ICD 3/18.  - Echo (6/18): EF 25-30%, mild AS.  - Echo (7/19): Moderate LV dilation, EF 25-30%, diffuse hypokinesis with inferior AK, normal RV size and systolic function, mild AS.  13. Cervical OA.  14. Polymyalgia rheumatica 15. OSA: Severe on sleep study 10/12.  On CPAP.  16. Left lower leg hematoma in setting of Effient use.  He developed a left gastrocnemius abscess and septic shock with prolonged hospitalization beginning in 7/13.  17. PAD: Lower extremity arterial doppler evaluation in 2/17 showed occluded peroneal arteries bilaterally, ABI 1.1 (R) and 1.0 (L).   - ABIs normal in 1/19.  18. Carotid dopplers (10/17) with minimal disease.  19. Atrial fibrillation: Paroxysmal.  20. Ventricular fibrillation arrest: 3/18.  Medtronic ICD placed, now on amiodarone.  - VT storm 7/18, now s/p VT ablation.  21. Aortic stenosis: Mild on 6/18 echo and 7/19 echo.  22. Urothelial cancer: s/p TURBT of bladder tumor 11/18.   Family History:  Father died with MI at age 23. He was an alcoholic. Mother died with cancer at around 77.   Social History:  Occupation: retired Administrator  Married, lives in [place]  Past smoker, quit around [place] of  systems complete and found to be negative unless listed in HPI.      Current Outpatient Medications  Medication Sig Dispense Refill  . amiodarone (PACERONE) 200 MG tablet TAKE 1 TABLET BY MOUTH DAILY FROM [facility] THROUGH SATURDAY. DO NOT TAKE IT ON SUNDAY 90 tablet 2  . carvedilol (COREG) 12.5 MG tablet Take 1 tablet (12.5 mg total) by mouth 2 (two) times daily with a  meal. 30 tablet 1  . diphenhydramine-acetaminophen (TYLENOL PM) 25-500 MG TABS tablet Take 1 tablet at bedtime as needed by mouth (for sleep).     . furosemide (LASIX) 20 MG tablet TAKE 1 TABLET(20 MG) BY MOUTH TWICE DAILY 60 tablet 3  . glucose blood (ACCU-CHEK AVIVA PLUS) test strip USE TO CHECK BLOOD SUGAR ONCE TO TWICE DAILY 200 strip 1  . insulin detemir (LEVEMIR) 100 unit/ml SOLN Start 10 units [place] qhs and titrate up 2 units every 3 days for fasting blood sugars consistently > 130 3 mL 5  . losartan (COZAAR) 25 MG tablet TAKE 1 TABLET BY MOUTH TWICE DAILY. NEED APPINTMENT FOR FUTURE REFILLS 180 tablet 0  . Multiple Vitamin (MULTIVITAMIN WITH MINERALS) TABS tablet Take 1 tablet by mouth daily. Centrum Silver Adult 64+    . Omega-3 Fatty Acids (FISH OIL) 1000 MG CAPS Take 1,000 mg by mouth 2 (two) times daily.     . rivaroxaban (XARELTO) 20 MG TABS tablet Take 1 tablet (20 mg total) by mouth daily with supper. 90 tablet 1  . rosuvastatin (CRESTOR) 10 MG tablet Take 1 tablet (10 mg total) by mouth daily. 90 tablet 0  . tamsulosin (FLOMAX) 0.4 MG CAPS capsule Take 0.4 mg by mouth at bedtime.    . TRUEplus Lancets 30G MISC USE TO CHECK BLOOD SUGAR EVERY DAY TO TWICE DAILY 100 each 1   No current facility-administered medications for this encounter.    Vitals:   04/17/20 1052  BP: 116/74  Pulse: 66  SpO2: 96%  Weight: 109.9 kg (242 lb 3.2 oz)   Wt Readings from Last 3 Encounters:  04/17/20 109.9 kg (242 lb 3.2 oz)  11/30/19 108.4 kg (239 lb)  03/01/19 101.6 kg (223 lb 15.8 oz)  Reds Clip 33%.  General:  Ambulated in the  clinic with cane. No resp difficulty HEENT: normal Neck: supple. no JVD. Carotids 2+ bilat; no bruits. No lymphadenopathy or thryomegaly appreciated. Cor: PMI nondisplaced. Regular rate & rhythm. No rubs, gallops or murmurs. Lungs: clear Abdomen: soft, nontender, distended. No hepatosplenomegaly. No bruits or masses. Good bowel sounds. Extremities: no cyanosis, clubbing, rash, LLE trace -1+edema Neuro: alert & orientedx3, cranial nerves grossly intact. moves all 4 extremities w/o difficulty. Affect pleasant    Assessment/Plan: 1. Chronic systolic CHF: Ischemic cardiomyopathy.  6/18 echo with EF 25-30%, diffuse hypokinesis.  CPX in 10/17 with severe HF limitation.  RHC in 12/17 with elevated filling pressures and low cardiac output.  Medtronic ICD in 3/18 after vfib arrest. He had VT again in 6/18, then VT storm in 7/18 now s/p VT ablation.  - Echo 06/2018  EF 25-30% - NYHA II-III. Reds Clip 33%. Gained 15-20 pounds over the last year.  - Volume status stable. Continue lasix 20 mg BID.  - Continue Coreg at 12.5 mg BID -No longer on spironolactone for the last few months. Restart 12.5 mg spiro daily.   - Continue losartan 25 mg bid. Not a candidate for entresto with history of angioedema.    - He is off digoxin and would like to remain off per patient request.  2. CAD:  Now s/p cutting balloon angioplasty to in-stent restenosis in proximal LAD.  He has an occluded RCA but there were left to right collaterals.   -No chest pain.  - He was on  Repatha with good lipid control.  Unfortunately, he will not be able to get Repatha in the future as he does not have Medicare part D.   -  Continue Crestor 10 mg daily.  - He is not on ASA given Xarelto use.  3. HLD: See discussion above regarding restarting Crestor above.  4. PAD: Normal ABIs in 1/19.   No change.  5. Diabetes type II: No change per PCP  6.PAF:  -Regular on exam.  - Continue Xarelto 20 mg daily. - Continue amiodarone 200 mg  daily.Mon-Sat - Would consider atrial fibrillation ablation based on CASTLE-HF data if AF recurs. 7. Ventricular fibrillation arrest:  -On amiodarone and has Medtronic ICD.  He had VT ablation in 7/18 after episode of VT storm.  - No VT on interrogation today.  8. OSA:  - Not using CPAP. Does not tolerate.  9. CKD: Stage 3.  BMEt today.  10. Bladder Cancer - Underwent cystoscopy with biopsy on 05/25/18  Restart spiro 12.5 mg daily. Repeat BMET in 10 days. Discussed purpose of med change.  Follow up in 3 months with Dr Aundra Dubin and an ECHO.     Darrick Grinder, NP  04/17/2020

## 2020-04-17 NOTE — Progress Notes (Signed)
ReDS Vest / Clip - 04/17/20 1100      ReDS Vest / Clip   Station Marker  D    Ruler Value  40    ReDS Value Range  Low volume    ReDS Actual Value  33    Anatomical Comments  sitting

## 2020-04-21 ENCOUNTER — Emergency Department (HOSPITAL_BASED_OUTPATIENT_CLINIC_OR_DEPARTMENT_OTHER)
Admission: EM | Admit: 2020-04-21 | Discharge: 2020-04-21 | Disposition: A | Discharge status: Home / Self Care | Payer: Medicare Other | Attending: Emergency Medicine | Admitting: Emergency Medicine

## 2020-04-21 ENCOUNTER — Encounter (HOSPITAL_BASED_OUTPATIENT_CLINIC_OR_DEPARTMENT_OTHER): Payer: Self-pay

## 2020-04-21 ENCOUNTER — Other Ambulatory Visit: Payer: Self-pay

## 2020-04-21 DIAGNOSIS — R339 Retention of urine, unspecified: Secondary | ICD-10-CM | POA: Diagnosis not present

## 2020-04-21 DIAGNOSIS — Z794 Long term (current) use of insulin: Secondary | ICD-10-CM | POA: Diagnosis not present

## 2020-04-21 DIAGNOSIS — Z79899 Other long term (current) drug therapy: Secondary | ICD-10-CM | POA: Insufficient documentation

## 2020-04-21 DIAGNOSIS — R310 Gross hematuria: Secondary | ICD-10-CM | POA: Diagnosis not present

## 2020-04-21 DIAGNOSIS — E119 Type 2 diabetes mellitus without complications: Secondary | ICD-10-CM | POA: Diagnosis not present

## 2020-04-21 DIAGNOSIS — I2510 Atherosclerotic heart disease of native coronary artery without angina pectoris: Secondary | ICD-10-CM | POA: Insufficient documentation

## 2020-04-21 DIAGNOSIS — Z87891 Personal history of nicotine dependence: Secondary | ICD-10-CM | POA: Diagnosis not present

## 2020-04-21 DIAGNOSIS — Z8551 Personal history of malignant neoplasm of bladder: Secondary | ICD-10-CM | POA: Diagnosis not present

## 2020-04-21 DIAGNOSIS — Z95810 Presence of automatic (implantable) cardiac defibrillator: Secondary | ICD-10-CM | POA: Insufficient documentation

## 2020-04-21 DIAGNOSIS — Z955 Presence of coronary angioplasty implant and graft: Secondary | ICD-10-CM | POA: Diagnosis not present

## 2020-04-21 DIAGNOSIS — I130 Hypertensive heart and chronic kidney disease with heart failure and stage 1 through stage 4 chronic kidney disease, or unspecified chronic kidney disease: Secondary | ICD-10-CM | POA: Diagnosis not present

## 2020-04-21 DIAGNOSIS — R319 Hematuria, unspecified: Secondary | ICD-10-CM | POA: Diagnosis present

## 2020-04-21 DIAGNOSIS — N183 Chronic kidney disease, stage 3 unspecified: Secondary | ICD-10-CM | POA: Insufficient documentation

## 2020-04-21 DIAGNOSIS — Z7901 Long term (current) use of anticoagulants: Secondary | ICD-10-CM | POA: Diagnosis not present

## 2020-04-21 DIAGNOSIS — I5022 Chronic systolic (congestive) heart failure: Secondary | ICD-10-CM | POA: Diagnosis not present

## 2020-04-21 LAB — URINALYSIS, ROUTINE W REFLEX MICROSCOPIC

## 2020-04-21 LAB — BASIC METABOLIC PANEL
Anion gap: 8 (ref 5–15)
BUN: 22 mg/dL (ref 8–23)
CO2: 25 mmol/L (ref 22–32)
Calcium: 8.9 mg/dL (ref 8.9–10.3)
Chloride: 105 mmol/L (ref 98–111)
Creatinine, Ser: 1.17 mg/dL (ref 0.61–1.24)
GFR calc Af Amer: >60 mL/min (ref >60)
GFR calc non Af Amer: >60 mL/min (ref >60)
Glucose, Bld: 224 mg/dL — ABNORMAL HIGH (ref 70–99)
Potassium: 4.8 mmol/L (ref 3.5–5.1)
Sodium: 138 mmol/L (ref 135–145)

## 2020-04-21 LAB — CBC
HCT: 48.6 % (ref 39.0–52.0)
Hemoglobin: 15.8 g/dL (ref 13.0–17.0)
MCH: 29.3 pg (ref 26.0–34.0)
MCHC: 32.5 g/dL (ref 30.0–36.0)
MCV: 90.2 fL (ref 80.0–100.0)
Platelets: 148 10*3/uL — ABNORMAL LOW (ref 150–400)
RBC: 5.39 MIL/uL (ref 4.22–5.81)
RDW: 13.9 % (ref 11.5–15.5)
WBC: 5.8 10*3/uL (ref 4.0–10.5)
nRBC: 0 % (ref 0.0–0.2)

## 2020-04-21 LAB — URINALYSIS, MICROSCOPIC (REFLEX): RBC / HPF: >50 RBC/hpf (ref 0–5)

## 2020-04-21 NOTE — ED Provider Notes (Addendum)
[facility] EMERGENCY DEPARTMENT Provider Note   CSN: [id number] Arrival date & time: 04/21/20  1307     History Chief Complaint  Patient presents with  . Hematuria    Wayne [name] is a 79 y.o. male.  Patient with history of bladder cancer status post treatment, on Xarelto chronically due to previous placed stents and coronary artery disease, chronic kidney disease --presents to the emergency department today for urinary retention and blood in the urine.  Patient states that he has noted blood in his urine for the past 2 or 3 days.  Today the urine slowed and he had the urge to use the bathroom but could not void.  He presents for the symptoms.  Last dose of Xarelto was yesterday.  He denies any lightheadedness, syncope, chest pain or shortness of breath.  He states that he is due to follow-up with his urologist next week.  No other complaints.        Past Medical History:  Diagnosis Date  . AICD (automatic cardioverter/defibrillator) present   . Arthritis   . Cancer [facility])    bladder and prostate  . Cellulitis of left leg    a. 0000000 complicated by septic shock  . Chronic systolic CHF (congestive heart failure) ([place])   . CKD (chronic kidney disease), stage III    pt denies  . Colon polyps   . CORONARY ATHEROSCLEROSIS NATIVE CORONARY ARTERY cardiologist-  dr Aundra Dubin   a. 01/2011 Cath/PCI: LM nl, LAD 40-50p, D1 80-small, LCX 95-small, RI 90, RCA 100, EF 20%;  b. 01/2011 Card MRI - No transmural scar;  c. 01/2011 PCI RCA->5 Promus DES, RI->3.0x16 Promus DES; d. Cath 01/13/13 patent LAD & Ramus, diffuse LCx dz, RCA mult overlapping stents w/ 95% osital stenosis, EF 20%, s/p DES to ostial/prox RCA 01/24/13   . Diabetes mellitus, type 2 ([place])    type 2  . GERD (gastroesophageal reflux disease)   . Hematoma of leg    a. left leg hematoma 03/2012 in the setting of asa/effient  . Herpes zoster ophthalmicus   . History of non-ST elevation myocardial infarction (NSTEMI) 06/2012  .  History of prostate cancer    dx 2009---  s/p radioactive seed implants 11-24-2008  . HYPERLIPIDEMIA    intolerant to Lipitor (myalgias)  . HYPERTENSION   . Ischemic cardiomyopathy    a. 01/2012 Echo EF 45%, mild LVH; b. [id number], grade 1 diastolic dysfunction, diffuse hypokinesis, inferoposterior akinesis   . Myocardial infarction ([place])    x2  . Noncompliance   . Obesity   . OSA (obstructive sleep apnea)    dx 2012  severe osa  no cpap just uses nose strips  . PAF (paroxysmal atrial fibrillation) ([place])    first dx 01-16-2017  . Polymyalgia rheumatica ([place])   . S/P drug eluting coronary stent placement    total 6 DES    Patient Active Problem List   Diagnosis Date Noted  . ICD (implantable cardioverter-defibrillator) in place 11/30/2019  . Ischemic cardiomyopathy 11/30/2019  . Bladder cancer ([place]) 11/09/2017  . Low back pain radiating to left lower extremity 09/21/2017  . Foot drop, left 08/24/2017  . Diabetes mellitus ([place])   . ICD (implantable cardioverter-defibrillator) discharge 07/05/2017  . Syncope and collapse   . Ventricular tachycardia ([place]) 06/18/2017  . Paroxysmal atrial fibrillation ([place]) 06/10/2017  . Hypoxemia   . VT (ventricular tachycardia) ([place])   . Cardiac arrest ([place]) 05/22/2017  . Defibrillator discharge 02/11/2017  . Atrial  fibrillation with rapid ventricular response ([place]) 01/16/2017  . Coronary stent occlusion 11/18/2016  . Type 2 diabetes, uncontrolled, with neuropathy ([place]) 09/30/2016  . CKD (chronic kidney disease) stage 3, GFR 30-59 ml/min 09/30/2016  . CAD (coronary artery disease) 11/20/2015  . Leucocytoclastic vasculitis ([place]) 06/25/2012  . Staphylococcus aureus bacteremia with sepsis ([place]) 06/18/2012  . NSTEMI, initial episode of care ([place]) 06/16/2012  . Cellulitis and abscess of lower leg 06/14/2012    Class: Acute  . Healthcare-associated pneumonia 06/14/2012    Class: Acute  . Hyperglycemia 06/14/2012    Class: Acute  . Leg swelling  04/22/2012  . Urethral stricture 04/15/2012  . Gross hematuria 03/22/2012  . Hematochezia 03/22/2012  . Obesity 01/08/2012  . ED (erectile dysfunction) of organic origin 09/11/2011  . OSA (obstructive sleep apnea) 09/02/2011  . Systolic CHF, chronic ([place]) 06/10/2011  . HEMATOCHEZIA 02/10/2011  . BURSITIS, LEFT HIP 02/10/2011  . CORONARY ATHEROSCLEROSIS NATIVE CORONARY ARTERY 01/27/2011  . CHEST PAIN UNSPECIFIED 01/20/2011  . DYSPNEA 01/16/2011  . ABDOMINAL PAIN, UNSPECIFIED SITE 01/13/2011  . ECZEMA 01/28/2010  . BRONCHITIS, ACUTE WITH MILD BRONCHOSPASM 12/21/2009  . HERPES ZOSTER OPHTHALMICUS 07/19/2009  . ADENOCARCINOMA, PROSTATE 06/20/2009  . Secondary DM with peripheral vascular disease, uncontrolled ([place]) 06/20/2009  . Hyperlipidemia 06/20/2009  . Essential hypertension 06/20/2009  . HYPERTROPHY PROSTATE W/UR OBST & OTH LUTS 06/20/2009    Past Surgical History:  Procedure Laterality Date  . CARDIAC CATHETERIZATION  01/24/2013  . CARDIAC CATHETERIZATION N/A 11/14/2016   Procedure: Right/Left Heart Cath and Coronary Angiography;  Surgeon: Larey Dresser, MD;  Location: [place] CV LAB;  Service: Cardiovascular;  Laterality: N/A;  . CARDIAC CATHETERIZATION N/A 11/17/2016   Procedure: Coronary Stent Intervention w/Impella;  Surgeon: Peter M Martinique, MD;  Location: [place] CV LAB;  Service: Cardiovascular;  Laterality: N/A;  . CARDIAC CATHETERIZATION N/A 11/17/2016   Procedure: Coronary Atherectomy;  Surgeon: Peter M Martinique, MD;  Location: [place] CV LAB;  Service: Cardiovascular;  Laterality: N/A;  . CORONARY ANGIOPLASTY WITH STENT PLACEMENT  01-30-2011   dr Aundra Dubin   PTCA and multiple overlapping DEStenting to RCA and 1 DES to RI   . CORONARY BALLOON ANGIOPLASTY N/A 07/06/2017   Procedure: Coronary Balloon Angioplasty;  Surgeon: Sherren Mocha, MD;  Location: [place] CV LAB;  Service: Cardiovascular;  Laterality: N/A;  . CYSTOSCOPY WITH FULGERATION N/A 05/25/2018    Procedure: CYSTOSCOPY WITH FULGERATION/BLADDER BIOPSY , 15 mm lesion;  Surgeon: Festus Aloe, MD;  Location: [facility];  Service: Urology;  Laterality: N/A;  . I & D EXTREMITY  06/15/2012   Procedure: IRRIGATION AND DEBRIDEMENT EXTREMITY;  Surgeon: Newt Minion, MD;  Location: [place];  Service: Orthopedics;  Laterality: Left;  I&D Left Posterior Knee  . I & D EXTREMITY  06/30/2012   Procedure: IRRIGATION AND DEBRIDEMENT EXTREMITY;  Surgeon: Newt Minion, MD;  Location: [place];  Service: Orthopedics;  Laterality: Left;  Left Leg Irrigation and Debridement and placement of Wound VAC and application of  A-cell  . I & D EXTREMITY  07/20/2012   Procedure: IRRIGATION AND DEBRIDEMENT EXTREMITY;  Surgeon: Newt Minion, MD;  Location: [place];  Service: Orthopedics;  Laterality: Left;  Irrigation and Debridement Left Leg and Place antibiotic beads   . ICD IMPLANT N/A 02/12/2017   Procedure: ICD Implant;  Surgeon: Evans Lance, MD;  Location: [place] CV LAB;  Service: Cardiovascular;  Laterality: N/A;  . LAPAROSCOPIC INGUINAL HERNIA REPAIR Right 09-11-2008   dr  byerly  [facility]  . LEAD REVISION/REPAIR N/A 05/26/2017   Procedure: Lead Revision/Repair;  Surgeon: Evans Lance, MD;  Location: [place] CV LAB;  Service: Cardiovascular;  Laterality: N/A;  . LEFT HEART CATH AND CORONARY ANGIOGRAPHY N/A 07/06/2017   Procedure: Left Heart Cath and Coronary Angiography;  Surgeon: Larey Dresser, MD;  Location: [place] CV LAB;  Service: Cardiovascular;  Laterality: N/A;  . PERCUTANEOUS CORONARY STENT INTERVENTION (PCI-S) N/A 01/24/2013   Procedure: PERCUTANEOUS CORONARY STENT INTERVENTION (PCI-S);  Surgeon: Wellington Hampshire, MD;  Location: [facility] CATH LAB;  Service: Cardiovascular;  Laterality: N/A;  . RADIOACTIVE PROSTATE SEED IMPLANTS  11-24-2008   dr Tresa Endo  [facility]  . TRANSURETHRAL RESECTION OF BLADDER TUMOR N/A 10/30/2017   Procedure: TRANSURETHRAL RESECTION OF BLADDER TUMOR (TURBT)/ INSTILLATION OF  EPIRUBICIN;  Surgeon: Festus Aloe, MD;  [facility]: [facility];  Service: Urology;  Laterality: N/A;  . ULTRASOUND GUIDANCE FOR VASCULAR ACCESS  11/14/2016   Procedure: Ultrasound Guidance For Vascular Access;  Surgeon: Larey Dresser, MD;  Location: [place] CV LAB;  Service: Cardiovascular;;  . Stephanie Coup ABLATION N/A 06/19/2017   Procedure: Stephanie Coup Ablation;  Surgeon: Evans Lance, MD;  Location: [place] CV LAB;  Service: Cardiovascular;  Laterality: N/A;       Family History  Problem Relation Age of Onset  . Cancer Mother 9       unknown CA  . Heart disease Father   . Alcohol abuse Father   . Heart attack Father 24  . Heart attack Brother     Social History   Tobacco Use  . Smoking status: Former Smoker    Packs/day: 0.30    Years: 20.00    Pack years: 6.00    Types: Cigarettes    Quit date: 02/23/1989    Years since quitting: 31.1  . Smokeless tobacco: Never Used  Substance Use Topics  . Alcohol use: No  . Drug use: No    Home Medications Prior to Admission medications   Medication Sig Start Date End Date Taking? Authorizing Provider  amiodarone (PACERONE) 200 MG tablet TAKE 1 TABLET BY MOUTH DAILY FROM [facility] THROUGH SATURDAY. DO NOT TAKE IT ON SUNDAY 04/03/20   Evans Lance, MD  carvedilol (COREG) 12.5 MG tablet Take 1 tablet (12.5 mg total) by mouth 2 (two) times daily with a meal. 04/17/20   Clegg, Amy D, NP  diphenhydramine-acetaminophen (TYLENOL PM) 25-500 MG TABS tablet Take 1 tablet at bedtime as needed by mouth (for sleep).     [provider]  furosemide (LASIX) 20 MG tablet TAKE 1 TABLET(20 MG) BY MOUTH TWICE DAILY 02/13/20   Clegg, Amy D, NP  glucose blood (ACCU-CHEK AVIVA PLUS) test strip USE TO CHECK BLOOD SUGAR ONCE TO TWICE DAILY 12/02/19   Burchette, Alinda Sierras, MD  insulin detemir (LEVEMIR) 100 unit/ml SOLN Start 10 units [place] qhs and titrate up 2 units every 3 days for fasting blood sugars consistently > 130 12/02/19   Burchette, Alinda Sierras, MD   losartan (COZAAR) 25 MG tablet TAKE 1 TABLET BY MOUTH TWICE DAILY. NEED APPINTMENT FOR FUTURE REFILLS 01/04/20   Larey Dresser, MD  Multiple Vitamin (MULTIVITAMIN WITH MINERALS) TABS tablet Take 1 tablet by mouth daily. Centrum Silver Adult 50+    [provider]  Omega-3 Fatty Acids (FISH OIL) 1000 MG CAPS Take 1,000 mg by mouth 2 (two) times daily.     [provider]  rivaroxaban (XARELTO) 20 MG  TABS tablet Take 1 tablet (20 mg total) by mouth daily with supper. 04/17/20   Clegg, Amy D, NP  rosuvastatin (CRESTOR) 10 MG tablet Take 1 tablet (10 mg total) by mouth daily. 04/17/20   Clegg, Amy D, NP  spironolactone (ALDACTONE) 25 MG tablet Take 0.5 tablets (12.5 mg total) by mouth daily. 04/17/20 04/17/21  Clegg, Amy D, NP  tamsulosin (FLOMAX) 0.4 MG CAPS capsule Take 0.4 mg by mouth at bedtime. 02/17/19   [provider]  TRUEplus Lancets 30G MISC USE TO CHECK BLOOD SUGAR EVERY DAY TO TWICE DAILY 03/15/20   Burchette, Alinda Sierras, MD    Allergies    Delene Loll [sacubitril-valsartan], Crestor [rosuvastatin calcium], Lipitor [atorvastatin], Plavix [clopidogrel], and Ancef [cefazolin]  Review of Systems   Review of Systems  Constitutional: Negative for fever.  HENT: Negative for rhinorrhea and sore throat.   Eyes: Negative for redness.  Respiratory: Negative for cough and shortness of breath.   Cardiovascular: Negative for chest pain.  Gastrointestinal: Positive for abdominal pain (Suprapubic). Negative for blood in stool, diarrhea, nausea and vomiting.  Genitourinary: Positive for hematuria. Negative for dysuria.  Musculoskeletal: Negative for myalgias.  Skin: Negative for rash.  Neurological: Negative for syncope.    Physical Exam Updated Vital Signs BP 123/69 (BP Location: Right Arm)   Pulse 66   Temp 97.9 F (36.6 C) (Oral)   Resp 16   Ht 5\' 8"  (1.727 m)   Wt 109.8 kg   SpO2 95%   BMI 36.80 kg/m   Physical Exam Vitals and nursing note reviewed.  Constitutional:       Appearance: He is well-developed.  HENT:     Head: Normocephalic and atraumatic.  Eyes:     Conjunctiva/sclera: Conjunctivae normal.  Pulmonary:     Effort: No respiratory distress.  Abdominal:     Palpations: Abdomen is soft.     Tenderness: There is no abdominal tenderness. There is no rebound.  Musculoskeletal:     Cervical back: Normal range of motion and neck supple.  Skin:    General: Skin is warm and dry.  Neurological:     Mental Status: He is alert.     ED Results / Procedures / Treatments   Labs (all labs ordered are listed, but only abnormal results are displayed) Labs Reviewed  URINALYSIS, ROUTINE W REFLEX MICROSCOPIC - Abnormal; Notable for the following components:      Result Value   Color, Urine RED (*)    APPearance TURBID (*)    Glucose, UA   (*)    Value: TEST NOT REPORTED DUE TO COLOR INTERFERENCE OF URINE PIGMENT   Hgb urine dipstick   (*)    Value: TEST NOT REPORTED DUE TO COLOR INTERFERENCE OF URINE PIGMENT   Bilirubin Urine   (*)    Value: TEST NOT REPORTED DUE TO COLOR INTERFERENCE OF URINE PIGMENT   Ketones, ur   (*)    Value: TEST NOT REPORTED DUE TO COLOR INTERFERENCE OF URINE PIGMENT   Protein, ur   (*)    Value: TEST NOT REPORTED DUE TO COLOR INTERFERENCE OF URINE PIGMENT   Nitrite   (*)    Value: TEST NOT REPORTED DUE TO COLOR INTERFERENCE OF URINE PIGMENT   Leukocytes,Ua   (*)    Value: TEST NOT REPORTED DUE TO COLOR INTERFERENCE OF URINE PIGMENT   All other components within normal limits  URINALYSIS, MICROSCOPIC (REFLEX) - Abnormal; Notable for the following components:   Bacteria, UA RARE (*)  All other components within normal limits  CBC - Abnormal; Notable for the following components:   Platelets 148 (*)    All other components within normal limits  BASIC METABOLIC PANEL - Abnormal; Notable for the following components:   Glucose, Bld 224 (*)    All other components within normal limits  URINE CULTURE    EKG None   Radiology No results found.  Procedures Procedures (including critical care time)  Medications Ordered in ED Medications - No data to display  ED Course  I have reviewed the triage vital signs and the nursing notes.  Pertinent labs & imaging results that were available during my care of the patient were reviewed by me and considered in my medical decision making (see chart for details).  Patient seen and examined.  Foley catheter was placed prior to my exam with resolution of the patient's symptoms.  There is red tinted blood noted in the catheter bag and tubing.  No significant clotting.  Discussed with patient and wife, given ongoing symptoms over a few days plus other comorbidities, would like to check lab work to evaluate hemoglobin and kidney function.  They agree.  Vital signs reviewed and are as follows: BP 123/69 (BP Location: Right Arm)   Pulse 66   Temp 97.9 F (36.6 C) (Oral)   Resp 16   Ht 5\' 8"  (1.727 m)   Wt 109.8 kg   SpO2 95%   BMI 36.80 kg/m   Patient with normal hemoglobin and kidney function.  Blood sugar is elevated into the low 200s likely in part due to stress from his recent retention.  No concern for DKA.  UA reviewed.  Dipstick affected by the color of the urine.  No significant findings for infection on microscopic exam and clinically the patient does not have signs of UTI.  Patient discussed with and seen by Dr. Maryan Rued prior to discharge.  We will have patient hold Xarelto tonight.  If the bleeding improves, he may resume tomorrow night, otherwise hold Xarelto tomorrow night and contact his cardiologist for further recommendations on Monday morning.  Patient and wife verbalized understanding agree with plan.    MDM Rules/Calculators/A&P                      Patient presents with urinary retention in setting of gross hematuria.  Unclear etiology of hematuria however patient is on anticoagulation.  Normal hemoglobin and renal function today.  Symptoms  resolved with Foley catheter.  Patient has appropriate cardiology and urology follow-up.  Doubt UTI, prostatitis.  Plan for discharge home with Foley catheter, close urology follow-up.  Final Clinical Impression(s) / ED Diagnoses Final diagnoses:  Urinary retention  Gross hematuria    Rx / DC Orders ED Discharge Orders    None          Carlisle Cater, PA-C 04/21/20 1613    Blanchie Dessert, MD 04/23/20 0008

## 2020-04-21 NOTE — Discharge Instructions (Signed)
Please read and follow all provided instructions.  Your diagnoses today include:  1. Urinary retention   2. Gross hematuria     Tests performed today include:  Urine test -shows blood  Blood counts electrolytes -normal red blood cell count and normal kidney function  Vital signs. See below for your results today.   Medications prescribed:   None  Take any prescribed medications only as directed.  Home care instructions:  Follow any educational materials contained in this packet.  Please do not take your Xarelto tonight to allow the bleeding to slow.  If you continue to see blood in the urine tomorrow, do not take your Xarelto on Sunday --and call your cardiologist for recommendations on Monday morning.  Follow-up instructions: Please follow-up with your urologist next week as planned.  Return instructions:   Please return to the Emergency Department if you experience worsening symptoms.   Please return if you have any other emergent concerns.  Additional Information:  Your vital signs today were: BP 134/85 (BP Location: Right Arm)   Pulse 76   Temp 98.3 F (36.8 C) (Oral)   Resp 18   Ht 5\' 8"  (1.727 m)   Wt 109.8 kg   SpO2 98%   BMI 36.80 kg/m  If your blood pressure (BP) was elevated above 135/85 this visit, please have this repeated by your doctor within one month. --------------

## 2020-04-21 NOTE — ED Triage Notes (Signed)
PT c/o intermittent hematuria x 2 days. Worse since last PM. PT has hx of bladder CA that has since been resolved.

## 2020-04-21 NOTE — ED Notes (Signed)
Alecia Lemming PA ED Provider at bedside.

## 2020-04-23 LAB — URINE CULTURE: Culture: NO GROWTH

## 2020-04-24 ENCOUNTER — Ambulatory Visit (HOSPITAL_COMMUNITY)
Admission: RE | Admit: 2020-04-24 | Discharge: 2020-04-24 | Disposition: A | Discharge status: Home / Self Care | Payer: Medicare Other | Source: Ambulatory Visit | Attending: Cardiology | Admitting: Cardiology

## 2020-04-24 ENCOUNTER — Telehealth (HOSPITAL_COMMUNITY): Payer: Self-pay | Admitting: *Deleted

## 2020-04-24 ENCOUNTER — Other Ambulatory Visit: Payer: Self-pay

## 2020-04-24 DIAGNOSIS — I5022 Chronic systolic (congestive) heart failure: Secondary | ICD-10-CM | POA: Insufficient documentation

## 2020-04-24 LAB — BASIC METABOLIC PANEL
Anion gap: 12 (ref 5–15)
BUN: 23 mg/dL (ref 8–23)
CO2: 25 mmol/L (ref 22–32)
Calcium: 9.5 mg/dL (ref 8.9–10.3)
Chloride: 101 mmol/L (ref 98–111)
Creatinine, Ser: 1.37 mg/dL — ABNORMAL HIGH (ref 0.61–1.24)
GFR calc Af Amer: 58 mL/min — ABNORMAL LOW (ref >60)
GFR calc non Af Amer: 50 mL/min — ABNORMAL LOW (ref >60)
Glucose, Bld: 302 mg/dL — ABNORMAL HIGH (ref 70–99)
Potassium: 5.1 mmol/L (ref 3.5–5.1)
Sodium: 138 mmol/L (ref 135–145)

## 2020-04-24 NOTE — Telephone Encounter (Signed)
Pt left VM stating that he was in ER Sat 5/8 for hematuria and urinary retention, he was told to stop his Xarelto. He has been off since that time and states bleeding seems to have resolved, they did leave foley in place and he is sch to see urology on Thur 5/13. He states this has been a recurring problem when he takes Xarelto daily, he has blood in his urine so he has been skipping days. He would like to know if he can decrease Xarelto dose or what Dr Aundra Dubin would recommend. Advised pt would send to Dr Aundra Dubin for recommendations and will call him back later today or tomorrow

## 2020-04-24 NOTE — Telephone Encounter (Signed)
Stop Xarelto, start Eliquis 5 mg bid after you see urology.

## 2020-04-25 MED ORDER — APIXABAN 2.5 MG PO TABS
5.0000 mg | ORAL_TABLET | Freq: Two times a day (BID) | ORAL | 11 refills | Status: DC
Start: 2020-04-25 — End: 2020-04-26

## 2020-04-25 NOTE — Telephone Encounter (Signed)
Spoke w/pt he is aware and agreeable, rx sent in he is aware to not start it until he sees urology tomorrow

## 2020-04-26 ENCOUNTER — Telehealth (HOSPITAL_COMMUNITY): Payer: Self-pay | Admitting: Pharmacist

## 2020-04-26 DIAGNOSIS — R310 Gross hematuria: Secondary | ICD-10-CM | POA: Diagnosis not present

## 2020-04-26 DIAGNOSIS — C670 Malignant neoplasm of trigone of bladder: Secondary | ICD-10-CM | POA: Diagnosis not present

## 2020-04-26 DIAGNOSIS — R338 Other retention of urine: Secondary | ICD-10-CM | POA: Diagnosis not present

## 2020-04-26 MED ORDER — APIXABAN 5 MG PO TABS
5.0000 mg | ORAL_TABLET | Freq: Two times a day (BID) | ORAL | 11 refills | Status: DC
Start: 2020-04-26 — End: 2020-08-28

## 2020-04-26 NOTE — Telephone Encounter (Signed)
Sent in prescription for Eliquis 5 mg tablets to [facility].   Audry Riles, PharmD, BCPS, BCCP, CPP Heart Failure Clinic Pharmacist [phone]

## 2020-05-01 ENCOUNTER — Telehealth (HOSPITAL_COMMUNITY): Payer: Self-pay | Admitting: *Deleted

## 2020-05-01 NOTE — Telephone Encounter (Signed)
pts wife left VM for our office to look out for a fax from another physicians office about pt. In office clinic staff aware to look for fax I am working remotely today.

## 2020-05-08 ENCOUNTER — Other Ambulatory Visit: Payer: Self-pay | Admitting: Urology

## 2020-05-08 NOTE — Progress Notes (Signed)
Reviewing pt chart for pre-op for surgery 05-18-2020.  Not pt has an ICD implant with ef 25--30%.  Per anesthesia guidelines for ambulatory surgery center, pt is not a candidate.  Called and spoke w/ Coni, OR scheduler for Dr Junious Silk,  Inform her pt will need to be moved to main OR and why.

## 2020-05-09 ENCOUNTER — Telehealth (HOSPITAL_COMMUNITY): Payer: Self-pay

## 2020-05-09 ENCOUNTER — Telehealth: Payer: Self-pay | Admitting: *Deleted

## 2020-05-09 NOTE — Telephone Encounter (Signed)
Clearance faxed to [facility], approved for surgery. confirmation received.

## 2020-05-10 ENCOUNTER — Encounter: Payer: Self-pay | Admitting: *Deleted

## 2020-05-10 DIAGNOSIS — Z006 Encounter for examination for normal comparison and control in clinical research program: Secondary | ICD-10-CM

## 2020-05-10 NOTE — Research (Signed)
Beat HF 37m visit  Patient doing well, no complaints of CP or SOB. Only med change was from Xarelto to Eliquis.  He is going for a turp beginning of June.    Current Outpatient Medications:  .  amiodarone (PACERONE) 200 MG tablet, TAKE 1 TABLET BY MOUTH DAILY FROM MONDAY THROUGH SATURDAY. DO NOT TAKE IT ON SUNDAY (Patient taking differently: Take 200 mg by mouth See admin instructions. TAKE 1 TABLET BY MOUTH DAILY FROM [facility] THROUGH SATURDAY. DO NOT TAKE IT ON SUNDAY), Disp: 90 tablet, Rfl: 2 .  apixaban (ELIQUIS) 5 MG TABS tablet, Take 1 tablet (5 mg total) by mouth 2 (two) times daily., Disp: 60 tablet, Rfl: 11 .  carvedilol (COREG) 12.5 MG tablet, Take 1 tablet (12.5 mg total) by mouth 2 (two) times daily with a meal., Disp: 90 tablet, Rfl: 3 .  diphenhydramine-acetaminophen (TYLENOL PM) 25-500 MG TABS tablet, Take 1 tablet at bedtime as needed by mouth (for sleep). , Disp: , Rfl:  .  furosemide (LASIX) 20 MG tablet, TAKE 1 TABLET(20 MG) BY MOUTH TWICE DAILY (Patient taking differently: Take 20 mg by mouth daily. ), Disp: 60 tablet, Rfl: 3 .  glucose blood (ACCU-CHEK AVIVA PLUS) test strip, USE TO CHECK BLOOD SUGAR ONCE TO TWICE DAILY, Disp: 200 strip, Rfl: 1 .  insulin detemir (LEVEMIR) 100 unit/ml SOLN, Start 10 units [place] qhs and titrate up 2 units every 3 days for fasting blood sugars consistently > 130 (Patient taking differently: Inject 10 Units into the skin daily as needed (for blood sugars>130.). Start 10 units [place] qhs and titrate up 2 units every 3 days for fasting blood sugars consistently > 130), Disp: 3 mL, Rfl: 5 .  losartan (COZAAR) 25 MG tablet, TAKE 1 TABLET BY MOUTH TWICE DAILY. NEED APPINTMENT FOR FUTURE REFILLS (Patient taking differently: Take 25 mg by mouth in the morning and at bedtime. TAKE 1 TABLET BY MOUTH TWICE DAILY. NEED APPINTMENT FOR FUTURE REFILLS), Disp: 180 tablet, Rfl: 0 .  Multiple Vitamin (MULTIVITAMIN WITH MINERALS) TABS tablet, Take 1 tablet by mouth daily.  Centrum Silver Adult 50+, Disp: , Rfl:  .  Omega-3 Fatty Acids (FISH OIL) 1000 MG CAPS, Take 1,000 mg by mouth 2 (two) times daily. , Disp: , Rfl:  .  rosuvastatin (CRESTOR) 10 MG tablet, Take 1 tablet (10 mg total) by mouth daily. (Patient taking differently: Take 10 mg by mouth every evening. ), Disp: 90 tablet, Rfl: 3 .  spironolactone (ALDACTONE) 25 MG tablet, Take 0.5 tablets (12.5 mg total) by mouth daily., Disp: 30 tablet, Rfl: 6 .  tamsulosin (FLOMAX) 0.4 MG CAPS capsule, Take 0.4 mg by mouth every other day. At bedtime, Disp: , Rfl:  .  TRUEplus Lancets 30G MISC, USE TO CHECK BLOOD SUGAR EVERY DAY TO TWICE DAILY, Disp: 100 each, Rfl: 1

## 2020-05-10 NOTE — Telephone Encounter (Signed)
Spoke with patient for 69 M visit see note from 65 M visit.

## 2020-05-16 ENCOUNTER — Encounter (HOSPITAL_COMMUNITY)
Admission: RE | Admit: 2020-05-16 | Discharge: 2020-05-16 | Disposition: A | Discharge status: Home / Self Care | Payer: Medicare Other | Source: Ambulatory Visit | Attending: Urology | Admitting: Urology

## 2020-05-16 ENCOUNTER — Encounter: Payer: Self-pay | Admitting: Urology

## 2020-05-16 ENCOUNTER — Other Ambulatory Visit: Payer: Self-pay

## 2020-05-16 ENCOUNTER — Other Ambulatory Visit (HOSPITAL_COMMUNITY)
Admission: RE | Admit: 2020-05-16 | Discharge: 2020-05-16 | Disposition: A | Discharge status: Home / Self Care | Payer: Medicare Other | Source: Ambulatory Visit | Attending: Urology | Admitting: Urology

## 2020-05-16 ENCOUNTER — Encounter (HOSPITAL_COMMUNITY): Payer: Self-pay

## 2020-05-16 DIAGNOSIS — Z01812 Encounter for preprocedural laboratory examination: Secondary | ICD-10-CM | POA: Insufficient documentation

## 2020-05-16 DIAGNOSIS — Z20822 Contact with and (suspected) exposure to covid-19: Secondary | ICD-10-CM | POA: Diagnosis not present

## 2020-05-16 HISTORY — DX: Personal history of urinary calculi: Z87.442

## 2020-05-16 LAB — BASIC METABOLIC PANEL
Anion gap: 10 (ref 5–15)
BUN: 25 mg/dL — ABNORMAL HIGH (ref 8–23)
CO2: 27 mmol/L (ref 22–32)
Calcium: 8.9 mg/dL (ref 8.9–10.3)
Chloride: 101 mmol/L (ref 98–111)
Creatinine, Ser: 1.3 mg/dL — ABNORMAL HIGH (ref 0.61–1.24)
GFR calc Af Amer: >60 mL/min (ref >60)
GFR calc non Af Amer: 53 mL/min — ABNORMAL LOW (ref >60)
Glucose, Bld: 253 mg/dL — ABNORMAL HIGH (ref 70–99)
Potassium: 5 mmol/L (ref 3.5–5.1)
Sodium: 138 mmol/L (ref 135–145)

## 2020-05-16 LAB — CBC
HCT: 50.2 % (ref 39.0–52.0)
Hemoglobin: 15.8 g/dL (ref 13.0–17.0)
MCH: 29 pg (ref 26.0–34.0)
MCHC: 31.5 g/dL (ref 30.0–36.0)
MCV: 92.1 fL (ref 80.0–100.0)
Platelets: 141 10*3/uL — ABNORMAL LOW (ref 150–400)
RBC: 5.45 MIL/uL (ref 4.22–5.81)
RDW: 13.4 % (ref 11.5–15.5)
WBC: 6.6 10*3/uL (ref 4.0–10.5)
nRBC: 0 % (ref 0.0–0.2)

## 2020-05-16 LAB — HEMOGLOBIN A1C
Hgb A1c MFr Bld: 10.4 % — ABNORMAL HIGH (ref 4.8–5.6)
Mean Plasma Glucose: 251.78 mg/dL

## 2020-05-16 LAB — GLUCOSE, CAPILLARY: Glucose-Capillary: 249 mg/dL — ABNORMAL HIGH (ref 70–99)

## 2020-05-16 LAB — SARS CORONAVIRUS 2 (TAT 6-24 HRS): SARS Coronavirus 2: NEGATIVE

## 2020-05-16 NOTE — Patient Instructions (Addendum)
DUE TO COVID-19 ONLY ONE VISITOR IS ALLOWED TO COME WITH YOU AND STAY IN THE WAITING ROOM ONLY DURING PRE OP AND PROCEDURE DAY OF SURGERY. TWO  VISITOR MAY VISIT WITH YOU AFTER SURGERY IN YOUR PRIVATE ROOM DURING VISITING HOURS ONLY! 10-a 8pm  YOU NEED TO HAVE A COVID 19 TEST ON___6-2____ @___1 :35____, THIS TEST MUST BE DONE BEFORE SURGERY, COME  [address], [place] , [address].  ([place]) ONCE YOUR COVID TEST IS COMPLETED, PLEASE BEGIN THE QUARANTINE INSTRUCTIONS AS OUTLINED IN YOUR HANDOUT.                Wayne [name]  05/16/2020   Your procedure is scheduled on: 05-18-20   Report to [facility] Main  Entrance   Report to admitting at      0545 AM     Call this number if you have problems the morning of surgery [phone]    Remember: Do not eat food or drink liquids :After Midnight.  BRUSH YOUR TEETH MORNING OF SURGERY AND RINSE YOUR MOUTH OUT, NO CHEWING GUM CANDY OR MINTS.     Take these medicines the morning of surgery with A SIP OF WATER: carvedilol, amiodarone  DO NOT TAKE ANY DIABETIC MEDICATIONS DAY OF YOUR SURGERY                               You may not have any metal on your body including hair pins and              piercings  Do not wear jewelry,  lotions, powders or perfumes, deodorant                Men may shave face and neck.   Do not bring valuables to the hospital. [place].  Contacts, dentures or bridgework may not be worn into surgery.      Patients discharged the day of surgery will not be allowed to drive home. IF YOU ARE HAVING SURGERY AND GOING HOME THE SAME DAY, YOU MUST HAVE AN ADULT TO DRIVE YOU HOME AND BE WITH YOU FOR 24 HOURS. YOU MAY GO HOME BY TAXI OR UBER OR ORTHERWISE, BUT AN ADULT MUST ACCOMPANY YOU HOME AND STAY WITH YOU FOR 24 HOURS.  Name and phone number of your driver:  Special Instructions: N/A              Please read over the following fact sheets  you were given: _____________________________________________________________________             [facility] - Preparing for Surgery Before surgery, you can play an important role.  Because skin is not sterile, your skin needs to be as free of germs as possible.  You can reduce the number of germs on your skin by washing with CHG (chlorahexidine gluconate) soap before surgery.  CHG is an antiseptic cleaner which kills germs and bonds with the skin to continue killing germs even after washing. Please DO NOT use if you have an allergy to CHG or antibacterial soaps.  If your skin becomes reddened/irritated stop using the CHG and inform your nurse when you arrive at Short Stay. Do not shave (including legs and underarms) for at least 48 hours prior to the first CHG shower.  You may shave your face/neck. Please  follow these instructions carefully:  1.  Shower with CHG Soap the night before surgery and the  morning of Surgery.  2.  If you choose to wash your hair, wash your hair first as usual with your  normal  shampoo.  3.  After you shampoo, rinse your hair and body thoroughly to remove the  shampoo.                           4.  Use CHG as you would any other liquid soap.  You can apply chg directly  to the skin and wash                       Gently with a scrungie or clean washcloth.  5.  Apply the CHG Soap to your body ONLY FROM THE NECK DOWN.   Do not use on face/ open                           Wound or open sores. Avoid contact with eyes, ears mouth and genitals (private parts).                       Wash face,  Genitals (private parts) with your normal soap.             6.  Wash thoroughly, paying special attention to the area where your surgery  will be performed.  7.  Thoroughly rinse your body with warm water from the neck down.  8.  DO NOT shower/wash with your normal soap after using and rinsing off  the CHG Soap.                9.  Pat yourself dry with a clean towel.            10.   Wear clean pajamas.            11.  Place clean sheets on your bed the night of your first shower and do not  sleep with pets. Day of Surgery : Do not apply any lotions/deodorants the morning of surgery.  Please wear clean clothes to the hospital/surgery center.  FAILURE TO FOLLOW THESE INSTRUCTIONS MAY RESULT IN THE CANCELLATION OF YOUR SURGERY PATIENT SIGNATURE_________________________________  NURSE SIGNATURE__________________________________  ________________________________________________________________________

## 2020-05-16 NOTE — Progress Notes (Signed)
[place] DEVICE PROGRAMMING  Patient Information: Name:  Wayne [name]  DOB:  1941/06/01  MRN:  [id number]    Planned Procedure: Transurethral resection of bladder tumor, bilateral retrogrades, instillation of post operative gemcitabine  Surgeon: Festus Aloe  Date of Procedure: 05-18-20  Cautery will be used.  Position during surgery: n/a   Please send documentation back to:  [facility] (Fax # [phone])   Device Information:  Clinic EP Physician:  [name], MD   Device Type:  Defibrillator Manufacturer and Phone #:  Medtronic: [id number] Pacemaker Dependent?:  Yes.   Date of Last Device Check:  04/17/2020 Normal Device Function?:  Yes.    Electrophysiologist's Recommendations:   Have magnet available.  Provide continuous ECG monitoring when magnet is used or reprogramming is to be performed.   Procedure should not interfere with device function.  No device programming or magnet placement needed.  Per Device Clinic Standing Orders, Simone Curia, RN  8:53 AM 05/16/2020

## 2020-05-16 NOTE — Progress Notes (Signed)
PCP - Dr. Tonie Griffith televisit 12-20-19 epic Cardiologist - Dr. Barrie Lyme 04-17-20 epic  PPM/ICD - ICD last device check 03-15-20 epic Device Orders - on chart  Rep Notified -   Chest x-ray -  EKG - 11-30-19 epic Stress Test -  ECHO - 06-22-2018 epic Cardiac Cath -   Sleep Study -  CPAP -   Fasting Blood Sugar - only checks 1-2 x a month if then Checks Blood Sugar _____ times a day  Blood Thinner Instructions:Elequis last dose 05-15-20 Aspirin Instructions:  ERAS Protcol - PRE-SURGERY Ensure or G2-   COVID TEST- 6-2   Anesthesia review: mild aortic stenosis, cardiac stents, ICD, osa no cpap does not tolerate, ,chf, v tach, hgb 10.4 per pt. He has not been taking his levemir per his choice  Patient denies shortness of breath, fever, cough and chest pain at PAT appointment   denies   All instructions explained to the patient, with a verbal understanding of the material. Patient agrees to go over the instructions while at home for a better understanding. Patient also instructed to self quarantine after being tested for COVID-19. The opportunity to ask questions was provided.

## 2020-05-17 NOTE — H&P (Signed)
Office Visit Report     04/26/2020   --------------------------------------------------------------------------------   Wayne [name]  [id number]  DOB: [birth year], 79 year old Male  SSN: -[id number]   PRIMARY CARE:  Carolann [place], MD  REFERRING:  Odis Luster, MD  PROVIDER:  Festus Aloe, M.D.  LOCATION:  [facility] [phone]     --------------------------------------------------------------------------------   CC: I have bladder cancer.  HPI: Wayne [name] is a 79 year-old male established patient who is here for bladder cancer.  His problem was diagnosed 10/25/2017. The bladder cancer was found because of blood in his urine.   His bladder cancer was treated by removal with scope. Patient denies removal of the entire bladder, radiation, and chemotherapy.   His last cysto was 12/12/2019.   His last U/S or CT Scan was 03/01/2019.   November 2018 TURBT-left HG Ta + epirubicin.  June 2019 left CIS + epirubicin   BCG: Sep 2019 BCG x 6   H/o PCa - brachytherapy for CaP in 2009 for a T1c, PSA 4.39, Gleason 3+3=6 three cores left, 39 gram prostate   Episode of gross hematuria Nov 2020 that resolved quickly. None since. Small tumor on trigone Dec 2020. Waiting for vaccine/Covid to settle, etc. Here for cysto. He had an episode of hematuria on Apr 22, 2019. He went to ER and Foley placed. Catheter has been draining clear since placement.     ALLERGIES: Entresto Lipitor TABS    MEDICATIONS: Amiodarone Hcl 200 mg tablet tablet  Carvedilol 12.5 mg tablet  Centrum Silver 0.4 mg-300 mcg-250 mcg tablet Oral  Crestor 10 mg tablet PRN  Fish Oil  Furosemide 20 mg tablet  Losartan Potassium 25 mg tablet  Spironolactone 25 mg tablet tablet  Tylenol Pm Extra Strength 500 mg-25 mg tablet     GU PSH: Bladder Instill AntiCA Agent - 08/18/2018, 08/11/2018, 08/04/2018, 07/21/2018, 07/14/2018, 06/23/2018, 05/25/2018, 10/30/2017 Cystoscopy - 12/12/2019, 07/14/2019,  03/14/2019, 12/07/2018, 2019, 2019, 2018 Cystoscopy TURBT <2 cm - 05/25/2018 Cystoscopy TURBT 2-5 cm - 10/30/2017 Locm 300-399Mg /Ml Iodine,1Ml - 2018 TRANSPERI NEEDLE PLACE, PROS - 2009       PSH Notes: Leg Repair, Surgery Prostate Transperineal Placement Of Needles, Hand Surgery   NON-GU PSH: None   GU PMH: Bladder Cancer Trigone - 12/12/2019 History of prostate cancer - 07/14/2019 Epididymo-orchitis - 03/14/2019, - 03/08/2019 History of bladder cancer (Stable) - 03/14/2019, - 2019 Urinary Retention - 03/14/2019, - 03/08/2019 Bladder Cancer Lateral - 03/08/2019, - 12/07/2018 Gross hematuria - 02/17/2019, - 02/14/2019, - 11/16/2017, - 2018, - 2018 Incomplete bladder emptying - 02/14/2019 ED due to arterial insufficiency - 12/07/2018, Erectile dysfunction due to arterial insufficiency, - 2017 Urinary Urgency - 11/16/2017 Bladder tumor/neoplasm - 2018 Prostate Cancer, Prostate cancer - 2017 Urinary Frequency, Increased urinary frequency - 2017 Urge incontinence, Urge incontinence of urine - 2015 Elevated PSA, Elevated prostate specific antigen (PSA) - 2015 BPH w/o LUTS, Benign prostatic hypertrophy without lower urinary tract symptoms - 2014 Nocturia, Nocturia - 2014 Unil Inguinal Hernia W/O obst or gang,non-recurrent, Inguinal hernia, unilateral - 2014    NON-GU PMH: Encounter for general adult medical examination without abnormal findings, Encounter for preventive health examination - 2015 Personal history of other diseases of the circulatory system, History of hypertension - 2014 Personal history of other endocrine, nutritional and metabolic disease, History of hypercholesterolemia - 2014    FAMILY HISTORY: Acute Myocardial Infarction - Father Cancer - Mother Death In The Family Father - Father Death In The Family Mother -  Mother Family Health Status Number - Runs In Family   SOCIAL HISTORY: Marital Status: Married Preferred Language: English; Ethnicity: Not Hispanic Or Latino; Race:  White    REVIEW OF SYSTEMS:    GU Review Male:   Patient denies frequent urination, hard to postpone urination, burning/ pain with urination, get up at night to urinate, leakage of urine, stream starts and stops, trouble starting your stream, have to strain to urinate , erection problems, and penile pain.  Gastrointestinal (Upper):   Patient denies nausea, vomiting, and indigestion/ heartburn.  Gastrointestinal (Lower):   Patient denies diarrhea and constipation.  Constitutional:   Patient denies fatigue, fever, night sweats, and weight loss.  Skin:   Patient denies skin rash/ lesion and itching.  Eyes:   Patient denies blurred vision and double vision.  Ears/ Nose/ Throat:   Patient denies sore throat and sinus problems.  Hematologic/Lymphatic:   Patient denies swollen glands and easy bruising.  Cardiovascular:   Patient denies leg swelling and chest pains.  Respiratory:   Patient denies cough and shortness of breath.  Endocrine:   Patient denies excessive thirst.  Musculoskeletal:   Patient denies back pain and joint pain.  Neurological:   Patient denies headaches and dizziness.  Psychologic:   Patient denies depression and anxiety.   VITAL SIGNS:      04/26/2020 02:56 PM  Weight 242 lb / 109.77 kg  Height 69 in / 175.26 cm  BP 118/74 mmHg  Heart Rate 65 /min  Temperature 98.0 F / 36.6 C  BMI 35.7 kg/m   GU PHYSICAL EXAMINATION:    Scrotum: No lesions. No edema. No cysts. No warts.  Testes: No tenderness, no swelling, no enlargement left testes. No tenderness, no swelling, no enlargement right testes. Normal location left testes. Normal location right testes. No mass, no cyst, no varicocele, no hydrocele left testes. No mass, no cyst, no varicocele, no hydrocele right testes.  Urethral Meatus: Normal size. No lesion, no wart, no discharge, no polyp. Normal location.  Penis: Circumcised, no warts, no cracks. No dorsal Peyronie's plaques, no left corporal Peyronie's plaques, no right  corporal Peyronie's plaques, no scarring, no warts. No balanitis, no meatal stenosis.   MULTI-SYSTEM PHYSICAL EXAMINATION:    Constitutional: Well-nourished. No physical deformities. Normally developed. Good grooming.  Neck: Neck symmetrical, not swollen. Normal tracheal position.  Respiratory: No labored breathing, no use of accessory muscles.   Cardiovascular: Normal temperature, normal extremity pulses, no swelling, no varicosities.  Skin: No paleness, no jaundice, no cyanosis. No lesion, no ulcer, no rash.  Neurologic / Psychiatric: Oriented to time, oriented to place, oriented to person. No depression, no anxiety, no agitation.  Gastrointestinal: No mass, no tenderness, no rigidity, non obese abdomen.     Complexity of Data:   10/10/19 12/27/14 03/13/14 05/06/10 10/19/09 04/20/09 01/19/09 08/07/08  PSA  Total PSA <0.015 ng/mL 0.09  0.11  1.55  1.11  2.73  4.22  4.39     PROCEDURES:         Flexible Cystoscopy - 52000  Risks, benefits, and some of the potential complications of the procedure were discussed with the patient. All questions were answered. Informed consent was obtained. Antibiotic prophylaxis was given -- Cephalexin. Sterile technique and intraurethral analgesia were used.  Meatus:  Normal size. Normal location. Normal condition.  Urethra:  No strictures.  External Sphincter:  Normal.  Verumontanum:  Normal.  Prostate:  Non-obstructing. No hyperplasia.  Bladder Neck:  Non-obstructing.  Ureteral Orifices:  Normal location.  Normal size. Normal shape. Effluxed clear urine.  Bladder:  A trigone tumor. No trabeculation. Normal mucosa. No stones.      The lower urinary tract was carefully examined. The procedure was well-tolerated and without complications. Antibiotic instructions were given. Instructions were given to call the office immediately for bloody urine, difficulty urinating, painful urination, fever, chills, nausea, vomiting or other illness. The patient stated  that he understood these instructions and would comply with them.   ASSESSMENT:      ICD-10 Details  1 GU:   Bladder Cancer Trigone - C67.0 Chronic, Stable - plan TURBT and gemcitabine - discussed nature r/b/a with pt.   2   Gross hematuria - R31.0 Chronic, Improving - will add retrogrades and [place] at f/u. This may be more related to his prior brachy than the small trigone tumor.   3   Urinary Retention - R33.8 Chronic, Stable - he voided today after the scope twice. Will leave foley out. As above- may be more related to his prior brachy than the small trigone tumor.    PLAN:           Schedule Return Visit/Planned Activity: Next Available Appointment - Schedule Surgery          Document Letter(s):  Created for Patient: Clinical Summary    * Signed by Festus Aloe, M.D. on 04/27/20 at 9:41 AM (EDT)*     The information contained in this medical record document is considered private and confidential patient information. This information can only be used for the medical diagnosis and/or medical services that are being provided by the patient's selected caregivers. This information can only be distributed outside of the patient's care if the patient agrees and signs waivers of authorization for this information to be sent to an outside source or route.

## 2020-05-17 NOTE — Anesthesia Preprocedure Evaluation (Addendum)
Anesthesia Evaluation  Patient identified by MRN, date of birth, ID band Patient awake    Reviewed: Allergy & Precautions, NPO status , Patient's Chart, lab work & pertinent test results  Airway Mallampati: I       Dental  (+) Upper Dentures, Lower Dentures   Pulmonary sleep apnea , former smoker,    Pulmonary exam normal        Cardiovascular hypertension, + CAD, + Past MI, + Cardiac Stents and +CHF  Normal cardiovascular exam+ Cardiac Defibrillator  Rate:Normal     Neuro/Psych negative psych ROS   GI/Hepatic   Endo/Other  diabetes, Type 2, Insulin Dependent  Renal/GU  Bladder dysfunction      Musculoskeletal   Abdominal (+) + obese,   Peds  Hematology   Anesthesia Other Findings  03/15/2020 9:48 PM EDT  Remote device check reviewed. Histograms appropriate. Leads and battery stable for patient. Follow up as outlined above. No recommended changes.     Reproductive/Obstetrics                            Anesthesia Physical Anesthesia Plan  ASA: III  Anesthesia Plan: General   Post-op Pain Management:    Induction: Intravenous  PONV Risk Score and Plan: Ondansetron and Treatment may vary due to age or medical condition  Airway Management Planned: LMA and Oral ETT  Additional Equipment: None  Intra-op Plan:   Post-operative Plan:   Informed Consent: I have reviewed the patients History and Physical, chart, labs and discussed the procedure including the risks, benefits and alternatives for the proposed anesthesia with the patient or authorized representative who has indicated his/her understanding and acceptance.     Dental advisory given  Plan Discussed with: CRNA  Anesthesia Plan Comments: (See PAT note 05/16/2020, Konrad Felix, PA-C)       Anesthesia Quick Evaluation                                  Anesthesia Evaluation  Patient identified by MRN, date of  birth, ID band Patient awake    Reviewed: Allergy & Precautions, NPO status , Patient's Chart, lab work & pertinent test results  Airway Mallampati: II  TM Distance: >3 FB Neck ROM: Full    Dental no notable dental hx. (+) Upper Dentures, Lower Dentures   Pulmonary shortness of breath, sleep apnea , former smoker,    Pulmonary exam normal breath sounds clear to auscultation       Cardiovascular hypertension, Pt. on medications and Pt. on home beta blockers + CAD, + Past MI, + Peripheral Vascular Disease and +CHF  Normal cardiovascular exam+ Cardiac Defibrillator  Rhythm:Regular Rate:Normal     Neuro/Psych negative neurological ROS     GI/Hepatic negative GI ROS, Neg liver ROS,   Endo/Other  diabetesMorbid obesity  Renal/GU Renal disease     Musculoskeletal   Abdominal (+) + obese,   Peds  Hematology negative hematology ROS (+)   Anesthesia Other Findings All: Plavix, entresto, ancef  Reproductive/Obstetrics                            Anesthesia Physical Anesthesia Plan  ASA: IV  Anesthesia Plan: General   Post-op Pain Management:    Induction: Intravenous  PONV Risk Score and Plan:   Airway Management Planned: LMA  Additional Equipment:  Intra-op Plan:   Post-operative Plan:   Informed Consent: I have reviewed the patients History and Physical, chart, labs and discussed the procedure including the risks, benefits and alternatives for the proposed anesthesia with the patient or authorized representative who has indicated his/her understanding and acceptance.     Plan Discussed with: CRNA  Anesthesia Plan Comments:        Anesthesia Quick Evaluation

## 2020-05-17 NOTE — Progress Notes (Signed)
Anesthesia Chart Review   Case: [id number] Date/Time: 05/18/20 0730   Procedure: TRANSURETHRAL RESECTION OF BLADDER TUMOR (TURBT)/ POST OPERATIVE INSTILLATION OF GEMCITABINE INTO BLADDER, BILATERAL RETROGRADE PYELOGRAM (N/A )   Anesthesia type: General   Pre-op diagnosis: BLADDER NEOPLASM   Location: [place] [facility]   Surgeons: Festus Aloe, MD      DISCUSSION:79 y.o. former smoker (6 pack years, quit 02/23/89) with h/o HLD, GERD, HTN, PAF (on Eliquis), OSA, poorly controlled DM II, CKD, AICD, CAD (+ stents), ischemic cardiomyopathy, mild aortic stenosis (Mean gradient (S): 9 mm Hg. Valve area (VTI): 1.66 cm^2), bladder neoplasm scheduled for above procedure 05/18/2020 with Dr. Festus Aloe.    S/p V tach ablation 06/2017.    Pt last seen by cardiology 04/17/2020.  Per OV note pt experiencing increased shortness of breath, denies PND/orthopnea.  No taking spironolactone as prescribed.  Restarted at this visit.  3 month follow up recommended, repeat Echo to be done at this visit.   Last seen by PCP 12/20/2019.  Per note DM poorly controlled.  Pt inconsistent with taking Levemir.  At that time recent fasting sugars 170-180.   At PAT visit pt reports checking BS only a few times per month.  Glucose 253 and A1C 10.4 05/16/2020.    Discussed with Dr. Kalman Shan.  Will get chest xray DOS.  Will inform cardio of upcoming procedure.  Evaluate glucose DOS.  VS: BP 122/64   Pulse (!) 58   Temp 36.6 C (Oral)   Resp 18   Ht 5\' 8"  (1.727 m)   Wt 111.3 kg   SpO2 95%   BMI 37.31 kg/m   PROVIDERS: [name], MD is PCP   Loralie Champagne, MD is Cardiologist  [name], MD is EP LABS: labs forwarded to surgeon (all labs ordered are listed, but only abnormal results are displayed)  Labs Reviewed  CBC - Abnormal; Notable for the following components:      Result Value   Platelets 141 (*)    All other components within normal limits  BASIC METABOLIC PANEL - Abnormal; Notable for  the following components:   Glucose, Bld 253 (*)    BUN 25 (*)    Creatinine, Ser 1.30 (*)    GFR calc non Af Amer 53 (*)    All other components within normal limits  HEMOGLOBIN A1C - Abnormal; Notable for the following components:   Hgb A1c MFr Bld 10.4 (*)    All other components within normal limits  GLUCOSE, CAPILLARY - Abnormal; Notable for the following components:   Glucose-Capillary 249 (*)    All other components within normal limits     IMAGES:   EKG: 11/30/2019 Rate 68 bpm NSR with IVCD  CV: Echo 06/22/2018 Study Conclusions   - Left ventricle: The cavity size was moderately dilated. Wall  thickness was normal. Systolic function was severely reduced. The  estimated ejection fraction was in the range of 25% to 30%.  Diffuse hypokinesis with inferior akinesis. Doppler parameters  are consistent with abnormal left ventricular relaxation (grade 1  diastolic dysfunction).  - Aortic valve: Trileaflet; moderately calcified leaflets. There  was mild stenosis. Mean gradient (S): 9 mm Hg. Valve area (VTI):  1.66 cm^2.  - Mitral valve: Mildly calcified annulus. There was trivial  regurgitation.  - Left atrium: The atrium was mildly dilated.  - Right ventricle: The cavity size was normal. Pacer wire or  catheter noted in right ventricle. Systolic function was normal.  -  Pulmonary arteries: No complete TR doppler jet so unable to  estimate PA systolic pressure.  - Inferior vena cava: The vessel was normal in size. The  respirophasic diameter changes were in the normal range (>= 50%),  consistent with normal central venous pressure.   Impressions:   - Moderate dilated LV with EF 25-30%. Diffuse hypokinesis with  inferior akinesis. Normal RV size and systolic function. Mild  aortic stenosis.  Past Medical History:  Diagnosis Date  . AICD (automatic cardioverter/defibrillator) present   . Arthritis   . Cancer [facility])    bladder and prostate   . Cellulitis of left leg    a. 0000000 complicated by septic shock  . Chronic systolic CHF (congestive heart failure) ([place])   . CKD (chronic kidney disease), stage III    pt denies  . Colon polyps   . CORONARY ATHEROSCLEROSIS NATIVE CORONARY ARTERY cardiologist-  dr Aundra Dubin   a. 01/2011 Cath/PCI: LM nl, LAD 40-50p, D1 80-small, LCX 95-small, RI 90, RCA 100, EF 20%;  b. 01/2011 Card MRI - No transmural scar;  c. 01/2011 PCI RCA->5 Promus DES, RI->3.0x16 Promus DES; d. Cath 01/13/13 patent LAD & Ramus, diffuse LCx dz, RCA mult overlapping stents w/ 95% osital stenosis, EF 20%, s/p DES to ostial/prox RCA 01/24/13   . Diabetes mellitus, type 2 ([place])    type 2  . GERD (gastroesophageal reflux disease)   . Hematoma of leg    a. left leg hematoma 03/2012 in the setting of asa/effient  . Herpes zoster ophthalmicus    neuropathy  . History of kidney stones   . History of non-ST elevation myocardial infarction (NSTEMI) 06/2012  . History of prostate cancer    dx 2009---  s/p radioactive seed implants 11-24-2008  . HYPERLIPIDEMIA    intolerant to Lipitor (myalgias)  . HYPERTENSION   . Ischemic cardiomyopathy    a. 01/2012 Echo EF 45%, mild LVH; b. [id number], grade 1 diastolic dysfunction, diffuse hypokinesis, inferoposterior akinesis   . Myocardial infarction ([place])    x2  . Noncompliance   . Obesity   . OSA (obstructive sleep apnea)    dx 2012  severe osa  no cpap just uses nose strips  . PAF (paroxysmal atrial fibrillation) ([place])    first dx 01-16-2017  . Polymyalgia rheumatica ([place])   . S/P drug eluting coronary stent placement    total 6 DES    Past Surgical History:  Procedure Laterality Date  . CARDIAC CATHETERIZATION  01/24/2013  . CARDIAC CATHETERIZATION N/A 11/14/2016   Procedure: Right/Left Heart Cath and Coronary Angiography;  Surgeon: Larey Dresser, MD;  Location: [place] CV [facility];  Service: Cardiovascular;  Laterality: N/A;  . CARDIAC CATHETERIZATION N/A 11/17/2016   Procedure:  Coronary Stent Intervention w/Impella;  Surgeon: Peter M Martinique, MD;  Location: [place] [facility];  Service: Cardiovascular;  Laterality: N/A;  . CARDIAC CATHETERIZATION N/A 11/17/2016   Procedure: Coronary Atherectomy;  Surgeon: Peter M Martinique, MD;  Location: [place] [facility];  Service: Cardiovascular;  Laterality: N/A;  . CORONARY ANGIOPLASTY WITH STENT PLACEMENT  01-30-2011   dr Aundra Dubin   PTCA and multiple overlapping DEStenting to RCA and 1 DES to RI   . CORONARY BALLOON ANGIOPLASTY N/A 07/06/2017   Procedure: Coronary Balloon Angioplasty;  Surgeon: Sherren Mocha, MD;  Location: [place] [facility];  Service: Cardiovascular;  Laterality: N/A;  . CYSTOSCOPY WITH FULGERATION N/A 05/25/2018   Procedure: CYSTOSCOPY WITH FULGERATION/BLADDER BIOPSY , 15 mm lesion;  Surgeon: Junious Silk,  Rodman Key, MD;  Location: [facility];  Service: Urology;  Laterality: N/A;  . I & D EXTREMITY  06/15/2012   Procedure: IRRIGATION AND DEBRIDEMENT EXTREMITY;  Surgeon: Newt Minion, MD;  Location: [place];  Service: Orthopedics;  Laterality: Left;  I&D Left Posterior Knee  . I & D EXTREMITY  06/30/2012   Procedure: IRRIGATION AND DEBRIDEMENT EXTREMITY;  Surgeon: Newt Minion, MD;  Location: [place];  Service: Orthopedics;  Laterality: Left;  Left Leg Irrigation and Debridement and placement of Wound VAC and application of  A-cell  . I & D EXTREMITY  07/20/2012   Procedure: IRRIGATION AND DEBRIDEMENT EXTREMITY;  Surgeon: Newt Minion, MD;  Location: [place];  Service: Orthopedics;  Laterality: Left;  Irrigation and Debridement Left Leg and Place antibiotic beads   . ICD IMPLANT N/A 02/12/2017   Procedure: ICD Implant;  Surgeon: Evans Lance, MD;  Location: [place] [facility];  Service: Cardiovascular;  Laterality: N/A;  . LAPAROSCOPIC INGUINAL HERNIA REPAIR Right 09-11-2008   dr Barry Dienes  [facility]  . LEAD REVISION/REPAIR N/A 05/26/2017   Procedure: Lead Revision/Repair;  Surgeon: Evans Lance, MD;  Location: [place] CV LAB;  Service:  Cardiovascular;  Laterality: N/A;  . LEFT HEART CATH AND CORONARY ANGIOGRAPHY N/A 07/06/2017   Procedure: Left Heart Cath and Coronary Angiography;  Surgeon: Larey Dresser, MD;  Location: [place] [facility] LAB;  Service: Cardiovascular;  Laterality: N/A;  . PERCUTANEOUS CORONARY STENT INTERVENTION (PCI-S) N/A 01/24/2013   Procedure: PERCUTANEOUS CORONARY STENT INTERVENTION (PCI-S);  Surgeon: [place], MD;  Location: [facility] C[facility];  Service: Cardiovascular;  Laterality: N/A;  . RADIOACTIVE PROSTATE SEED IMPLANTS  11-24-2008   dr Tresa Endo  [facility]  . TRANSURETHRAL RESECTION OF BLADDER TUMOR N/A 10/30/2017   Procedure: TRANSURETHRAL RESECTION OF BLADDER TUMOR (TURBT)/ INSTILLATION OF EPIRUBICIN;  Surgeon: Festus Aloe, MD;  Location: [facility];  Service: Urology;  Laterality: N/A;  . ULTRASOUND GUIDANCE FOR VASCULAR ACCESS  11/14/2016   Procedure: Ultrasound Guidance For Vascular Access;  Surgeon: Larey Dresser, MD;  Location: [place] CV LAB;  Service: Cardiovascular;;  . Stephanie Coup ABLATION N/A 06/19/2017   Procedure: Stephanie Coup Ablation;  Surgeon: Evans Lance, MD;  Location: [place] [facility];  Service: Cardiovascular;  Laterality: N/A;    MEDICATIONS: . amiodarone (PACERONE) 200 MG tablet  . apixaban (ELIQUIS) 5 MG TABS tablet  . carvedilol (COREG) 12.5 MG tablet  . diphenhydramine-acetaminophen (TYLENOL PM) 25-500 MG TABS tablet  . furosemide (LASIX) 20 MG tablet  . glucose blood (ACCU-CHEK AVIVA PLUS) test strip  . insulin detemir (LEVEMIR) 100 unit/ml SOLN  . losartan (COZAAR) 25 MG tablet  . Multiple Vitamin (MULTIVITAMIN WITH MINERALS) TABS tablet  . Omega-3 Fatty Acids (FISH OIL) 1000 MG CAPS  . rosuvastatin (CRESTOR) 10 MG tablet  . spironolactone (ALDACTONE) 25 MG tablet  . tamsulosin (FLOMAX) 0.4 MG CAPS capsule  . TRUEplus Lancets 30G MISC   No current facility-administered medications for this encounter.     Maia Plan [facility] Pre-Surgical Testing [phone] 05/17/20  10:37 AM

## 2020-05-18 ENCOUNTER — Encounter (HOSPITAL_COMMUNITY): Payer: Self-pay | Admitting: Urology

## 2020-05-18 ENCOUNTER — Ambulatory Visit (HOSPITAL_COMMUNITY)
Admission: RE | Admit: 2020-05-18 | Discharge: 2020-05-18 | Disposition: A | Discharge status: Home / Self Care | Payer: Medicare Other | Attending: Urology | Admitting: Urology

## 2020-05-18 ENCOUNTER — Other Ambulatory Visit: Payer: Self-pay

## 2020-05-18 ENCOUNTER — Ambulatory Visit (HOSPITAL_COMMUNITY): Payer: Medicare Other

## 2020-05-18 ENCOUNTER — Encounter (HOSPITAL_COMMUNITY)
Admission: RE | Disposition: A | Discharge status: Home / Self Care | Payer: Self-pay | Source: Home / Self Care | Attending: Urology

## 2020-05-18 ENCOUNTER — Ambulatory Visit (HOSPITAL_COMMUNITY): Payer: Medicare Other | Admitting: Physician Assistant

## 2020-05-18 DIAGNOSIS — R338 Other retention of urine: Secondary | ICD-10-CM | POA: Insufficient documentation

## 2020-05-18 DIAGNOSIS — C670 Malignant neoplasm of trigone of bladder: Secondary | ICD-10-CM | POA: Diagnosis not present

## 2020-05-18 DIAGNOSIS — I2510 Atherosclerotic heart disease of native coronary artery without angina pectoris: Secondary | ICD-10-CM | POA: Diagnosis not present

## 2020-05-18 DIAGNOSIS — Z888 Allergy status to other drugs, medicaments and biological substances status: Secondary | ICD-10-CM | POA: Diagnosis not present

## 2020-05-18 DIAGNOSIS — Z8546 Personal history of malignant neoplasm of prostate: Secondary | ICD-10-CM | POA: Diagnosis not present

## 2020-05-18 DIAGNOSIS — R0602 Shortness of breath: Secondary | ICD-10-CM | POA: Diagnosis not present

## 2020-05-18 DIAGNOSIS — I5022 Chronic systolic (congestive) heart failure: Secondary | ICD-10-CM | POA: Diagnosis not present

## 2020-05-18 DIAGNOSIS — E1122 Type 2 diabetes mellitus with diabetic chronic kidney disease: Secondary | ICD-10-CM | POA: Diagnosis not present

## 2020-05-18 DIAGNOSIS — C679 Malignant neoplasm of bladder, unspecified: Secondary | ICD-10-CM | POA: Diagnosis not present

## 2020-05-18 DIAGNOSIS — D414 Neoplasm of uncertain behavior of bladder: Secondary | ICD-10-CM | POA: Diagnosis present

## 2020-05-18 DIAGNOSIS — N401 Enlarged prostate with lower urinary tract symptoms: Secondary | ICD-10-CM | POA: Diagnosis not present

## 2020-05-18 DIAGNOSIS — R310 Gross hematuria: Secondary | ICD-10-CM | POA: Diagnosis not present

## 2020-05-18 DIAGNOSIS — I130 Hypertensive heart and chronic kidney disease with heart failure and stage 1 through stage 4 chronic kidney disease, or unspecified chronic kidney disease: Secondary | ICD-10-CM | POA: Diagnosis not present

## 2020-05-18 DIAGNOSIS — D494 Neoplasm of unspecified behavior of bladder: Secondary | ICD-10-CM | POA: Diagnosis not present

## 2020-05-18 HISTORY — PX: TRANSURETHRAL RESECTION OF BLADDER TUMOR: SHX2575

## 2020-05-18 LAB — GLUCOSE, CAPILLARY
Glucose-Capillary: 228 mg/dL — ABNORMAL HIGH (ref 70–99)
Glucose-Capillary: 270 mg/dL — ABNORMAL HIGH (ref 70–99)

## 2020-05-18 SURGERY — TURBT (TRANSURETHRAL RESECTION OF BLADDER TUMOR)
Anesthesia: General | Site: Bladder

## 2020-05-18 MED ORDER — FENTANYL CITRATE (PF) 100 MCG/2ML IJ SOLN
INTRAMUSCULAR | Status: AC
  Filled 2020-05-18: qty 2

## 2020-05-18 MED ORDER — PROPOFOL 10 MG/ML IV BOLUS
INTRAVENOUS | Status: AC
  Filled 2020-05-18: qty 20

## 2020-05-18 MED ORDER — PHENYLEPHRINE HCL (PRESSORS) 10 MG/ML IV SOLN
INTRAVENOUS | Status: AC
  Filled 2020-05-18: qty 1

## 2020-05-18 MED ORDER — LIDOCAINE HCL URETHRAL/MUCOSAL 2 % EX GEL
CUTANEOUS | Status: AC
  Filled 2020-05-18: qty 30

## 2020-05-18 MED ORDER — IOHEXOL 300 MG/ML  SOLN
INTRAMUSCULAR | Status: DC | PRN
  Administered 2020-05-18: 12 mL via URETHRAL

## 2020-05-18 MED ORDER — CIPROFLOXACIN IN D5W 400 MG/200ML IV SOLN
400.0000 mg | Freq: Once | INTRAVENOUS | Status: AC
  Administered 2020-05-18: 400 mg via INTRAVENOUS
  Filled 2020-05-18: qty 200

## 2020-05-18 MED ORDER — LACTATED RINGERS IV SOLN: INTRAVENOUS | Status: DC

## 2020-05-18 MED ORDER — LIDOCAINE 2% (20 MG/ML) 5 ML SYRINGE
INTRAMUSCULAR | Status: AC
  Filled 2020-05-18: qty 5

## 2020-05-18 MED ORDER — STERILE WATER FOR IRRIGATION IR SOLN
Status: DC | PRN
  Administered 2020-05-18: 500 mL

## 2020-05-18 MED ORDER — FENTANYL CITRATE (PF) 100 MCG/2ML IJ SOLN
INTRAMUSCULAR | Status: DC | PRN
  Administered 2020-05-18: 100 ug via INTRAVENOUS

## 2020-05-18 MED ORDER — PROPOFOL 10 MG/ML IV BOLUS
INTRAVENOUS | Status: DC | PRN
  Administered 2020-05-18: 140 mg via INTRAVENOUS

## 2020-05-18 MED ORDER — BELLADONNA ALKALOIDS-OPIUM 16.2-30 MG RE SUPP
RECTAL | Status: AC
  Filled 2020-05-18: qty 1

## 2020-05-18 MED ORDER — ONDANSETRON HCL 4 MG/2ML IJ SOLN
INTRAMUSCULAR | Status: AC
  Filled 2020-05-18: qty 2

## 2020-05-18 MED ORDER — PHENYLEPHRINE HCL-NACL 10-0.9 MG/250ML-% IV SOLN
INTRAVENOUS | Status: DC | PRN
  Administered 2020-05-18: 50 ug/min via INTRAVENOUS

## 2020-05-18 MED ORDER — CHLORHEXIDINE GLUCONATE 0.12 % MT SOLN
15.0000 mL | Freq: Once | OROMUCOSAL | Status: AC
  Administered 2020-05-18: 15 mL via OROMUCOSAL

## 2020-05-18 MED ORDER — DEXAMETHASONE SODIUM PHOSPHATE 10 MG/ML IJ SOLN
INTRAMUSCULAR | Status: AC
  Filled 2020-05-18: qty 1

## 2020-05-18 MED ORDER — SODIUM CHLORIDE 0.9 % IR SOLN
Status: DC | PRN
  Administered 2020-05-18 (×2): 3000 mL

## 2020-05-18 MED ORDER — ONDANSETRON HCL 4 MG/2ML IJ SOLN
INTRAMUSCULAR | Status: DC | PRN
  Administered 2020-05-18: 4 mg via INTRAVENOUS

## 2020-05-18 MED ORDER — GEMCITABINE CHEMO FOR BLADDER INSTILLATION 2000 MG
2000.0000 mg | Freq: Once | INTRAVENOUS | Status: AC
  Administered 2020-05-18: 2000 mg via INTRAVESICAL
  Filled 2020-05-18: qty 2000

## 2020-05-18 MED ORDER — LIDOCAINE 2% (20 MG/ML) 5 ML SYRINGE
INTRAMUSCULAR | Status: DC | PRN
  Administered 2020-05-18: 60 mg via INTRAVENOUS

## 2020-05-18 SURGICAL SUPPLY — 13 items
BAG DRN RND TRDRP ANRFLXCHMBR (UROLOGICAL SUPPLIES)
BAG URINE DRAIN 2000ML AR STRL (UROLOGICAL SUPPLIES) IMPLANT
BAG URO CATCHER STRL LF (MISCELLANEOUS) ×2 IMPLANT
CATH URET 5FR 28IN OPEN ENDED (CATHETERS) ×1 IMPLANT
GLOVE BIOGEL M STRL SZ7.5 (GLOVE) ×2 IMPLANT
GOWN STRL REUS W/TWL XL LVL3 (GOWN DISPOSABLE) ×2 IMPLANT
GUIDEWIRE STR DUAL SENSOR (WIRE) ×1 IMPLANT
KIT TURNOVER KIT A (KITS) IMPLANT
MANIFOLD NEPTUNE II (INSTRUMENTS) ×2 IMPLANT
PENCIL SMOKE EVACUATOR (MISCELLANEOUS) IMPLANT
TRAY CYSTO PACK (CUSTOM PROCEDURE TRAY) ×2 IMPLANT
TUBING CONNECTING 10 (TUBING) ×2 IMPLANT
TUBING UROLOGY SET (TUBING) ×2 IMPLANT

## 2020-05-18 NOTE — Discharge Instructions (Signed)
Bladder Biopsy, Care After This sheet gives you information about how to care for yourself after your procedure. Your health care provider may also give you more specific instructions. If you have problems or questions, contact your health care provider. What can I expect after the procedure? After the procedure, it is common to have:  Mild pain in your bladder or kidney area during urination.  Minor burning during urination.  Small amounts of blood in your urine.  A sudden urge to urinate.  A need to urinate more often than usual. Follow these instructions at home: Medicines  Take over-the-counter and prescription medicines only as told by your health care provider.  If you were prescribed an antibiotic medicine, take it as told by your health care provider. Do not stop taking the antibiotic even if you start to feel better. Activity  Rest if told by your health care provider.  Do not drive for 24 hours if you received a medicine to help you relax (sedative) during your procedure. Ask your health care provider when it is safe for you to drive.  Return to your normal activities as told by your health care provider. Ask your health care provider what activities are safe for you. General instructions   Take a warm bath to relieve any burning sensations around your urethra.  Hold a warm, damp washcloth over the urethral area to ease pain.  It is up to you to get the results of your procedure. Ask your health care provider, or the department that is doing the procedure, when your results will be ready.  Keep all follow-up visits as told by your health care provider. This is important. Contact a health care provider if:  You have a fever.  Your symptoms do not improve within 24 hours, and you continue to have: ? Burning during urination. ? Increasing amounts of blood in your urine. ? Pain during urination. ? An urgent need to urinate. ? A need to urinate more often than  usual. Get help right away if:  You have a lot of bleeding or more bleeding.  You have severe pain.  You are unable to urinate.  You have bright red blood in your urine.  You are passing blood clots in your urine.  You have a fever. Summary  After the procedure, it is common to have mild pain, burning with urination, and some blood.  Take medicines as told. If you were given antibiotics, finish all of it even if you start to feel better.  Rest after the procedure. Follow your health care provider's instructions for self care at home.  Contact a health care provider if your symptoms do not improve within 24 hours, or if you have more pain or more blood in your urine.  Get help right away if you have a lot of bleeding, severe pain, fever, or bright red blood or blood clots in the urine. This information is not intended to replace advice given to you by your health care provider. Make sure you discuss any questions you have with your health care provider. Document Revised: 06/08/2019 Document Reviewed: 06/08/2019 Elsevier Patient Education  2020 Elsevier Inc.   General Anesthesia, Adult, Care After This sheet gives you information about how to care for yourself after your procedure. Your health care provider may also give you more specific instructions. If you have problems or questions, contact your health care provider. What can I expect after the procedure? After the procedure, the following side effects are   common:  Pain or discomfort at the IV site.  Nausea.  Vomiting.  Sore throat.  Trouble concentrating.  Feeling cold or chills.  Weak or tired.  Sleepiness and fatigue.  Soreness and body aches. These side effects can affect parts of the body that were not involved in surgery. Follow these instructions at home:  For at least 24 hours after the procedure:  Have a responsible adult stay with you. It is important to have someone help care for you until you are  awake and alert.  Rest as needed.  Do not: ? Participate in activities in which you could fall or become injured. ? Drive. ? Use heavy machinery. ? Drink alcohol. ? Take sleeping pills or medicines that cause drowsiness. ? Make important decisions or sign legal documents. ? Take care of children on your own. Eating and drinking  Follow any instructions from your health care provider about eating or drinking restrictions.  When you feel hungry, start by eating small amounts of foods that are soft and easy to digest (bland), such as toast. Gradually return to your regular diet.  Drink enough fluid to keep your urine pale yellow.  If you vomit, rehydrate by drinking water, juice, or clear broth. General instructions  If you have sleep apnea, surgery and certain medicines can increase your risk for breathing problems. Follow instructions from your health care provider about wearing your sleep device: ? Anytime you are sleeping, including during daytime naps. ? While taking prescription pain medicines, sleeping medicines, or medicines that make you drowsy.  Return to your normal activities as told by your health care provider. Ask your health care provider what activities are safe for you.  Take over-the-counter and prescription medicines only as told by your health care provider.  If you smoke, do not smoke without supervision.  Keep all follow-up visits as told by your health care provider. This is important. Contact a health care provider if:  You have nausea or vomiting that does not get better with medicine.  You cannot eat or drink without vomiting.  You have pain that does not get better with medicine.  You are unable to pass urine.  You develop a skin rash.  You have a fever.  You have redness around your IV site that gets worse. Get help right away if:  You have difficulty breathing.  You have chest pain.  You have blood in your urine or stool, or you vomit  blood. Summary  After the procedure, it is common to have a sore throat or nausea. It is also common to feel tired.  Have a responsible adult stay with you for the first 24 hours after general anesthesia. It is important to have someone help care for you until you are awake and alert.  When you feel hungry, start by eating small amounts of foods that are soft and easy to digest (bland), such as toast. Gradually return to your regular diet.  Drink enough fluid to keep your urine pale yellow.  Return to your normal activities as told by your health care provider. Ask your health care provider what activities are safe for you. This information is not intended to replace advice given to you by your health care provider. Make sure you discuss any questions you have with your health care provider. Document Revised: 12/04/2017 Document Reviewed: 07/17/2017 Elsevier Patient Education  2020 Elsevier Inc.   

## 2020-05-18 NOTE — Transfer of Care (Signed)
Immediate Anesthesia Transfer of Care Note  Patient: Wayne [name]  Procedure(s) Performed: TRANSURETHRAL RESECTION OF BLADDER TUMOR (TURBT) 0.5-2.0 cm BILATERAL RETROGRADE PYELOGRAM (N/A Bladder)  Patient Location: PACU  Anesthesia Type:General  Level of Consciousness: awake, alert  and oriented  Airway & Oxygen Therapy: Patient Spontanous Breathing and Patient connected to face mask oxygen  Post-op Assessment: Report given to RN and Post -op Vital signs reviewed and stable  Post vital signs: Reviewed and stable  Last Vitals:  Vitals Value Taken Time  BP 141/93 05/18/20 0817  Temp    Pulse 65 05/18/20 0819  Resp 13 05/18/20 0819  SpO2 100 % 05/18/20 0819  Vitals shown include unvalidated device data.  Last Pain:  Vitals:   05/18/20 0636  TempSrc:   PainSc: 0-No pain      Patients Stated Pain Goal: 3 (98/72/15 8727)  Complications: No apparent anesthesia complications

## 2020-05-18 NOTE — Op Note (Signed)
Preoperative diagnosis: Bladder neoplasm uncertain malignant potential Postoperative diagnosis: Bladder neoplasm uncertain malignant potential, urethral stricture  Procedure: Cystoscopy, bilateral retrograde pyelogram, TURBT 0.5 to 2 cm, postoperative instillation of gemcitabine in PACU  Surgeon: Junious Silk  Anesthesia: General  Indication for procedure: Wayne [name] is a 79 year old male with a history of a high-grade papillary tumor in the bladder.  He has a history of brachytherapy.  Recent trigone recurrence noted on office cystoscopy.  He also has had significant low back pain.  Findings: On cystoscopy there was a urethral stricture the scope dilated, prostate nonobstructive.  Trigone and ureteral orifice ease in their normal orthotopic position.  Clear reflux bilaterally.  Small tumor on the trigone in the midline.  Moderate bladder trabeculation.  No stone or foreign body in the bladder.  Otherwise bladder mucosa was normal.  Left retrograde pyelogram-this outlined a single ureter single collecting system unit was delicate without filling defect, stricture or dilation.  Right retrograde pyelogram-this outlined a single ureter single collecting system unit was delicate without filling defect, stricture or dilation.  There was brisk drainage of both sides.  Description of procedure: After consent was obtained patient brought to the operating room.  After adequate anesthesia he was placed in lithotomy position and prepped and draped in the usual sterile fashion.  A timeout was performed to confirm the patient and procedure.  The 36 French rigid cystoscope was passed per urethra where a membranous urethral stricture was encountered.  I tried to use the scope to dilate the stricture but a flap of tissue began to develop.  I then passed a sensor wire to make sure I knew the proper lumen and way into the bladder.  Wire was coiled in the bladder under fluoroscopic guidance.  I then passed the scope along  the bladder and was able to dilate the stricture and into the bladder without difficulty.  The bladder was inspected with a 30 degree and 70 degree lens.  A 6 French open-ended catheter was used to cannulate the left and the right ureteral orifice and retrograde injection of contrast was performed.  We then turned our attention to the bladder tumor with the continuous flow sheath passed with the visual obturator and then switched out to the handle.  The trigone tumor was resected and thought to be superficial.  Hemostasis was excellent.  There continue to be good E flux from both ureters.  The scope was removed and an 8 [place] coud catheter was placed in left to gravity drainage.  He was awakened and taken to the cover room in stable condition.  Postoperative instillation of gemcitabine in PACU: Gemcitabine 2000 mg was instilled per urethra and allowed to dwell for 50 minutes.  The bladder was then drained and the catheter removed.  Complications: None  Blood loss: Minimal  Specimens: Trigone tumor to pathology  Drains: 18 French coud catheter  Disposition: Patient stable to PACU

## 2020-05-18 NOTE — Interval H&P Note (Signed)
History and Physical Interval Note:  05/18/2020 7:12 AM  Wayne [name]  has presented today for surgery, with the diagnosis of BLADDER NEOPLASM.  The various methods of treatment have been discussed with the patient and family. After consideration of risks, benefits and other options for treatment, the patient has consented to  Procedure(s): TRANSURETHRAL RESECTION OF BLADDER TUMOR (TURBT)/ POST OPERATIVE INSTILLATION OF GEMCITABINE INTO BLADDER, BILATERAL RETROGRADE PYELOGRAM (N/A) as a surgical intervention.  The patient's history has been reviewed, patient examined, no change in status, stable for surgery. He had a small blood clot yesterday. He's had some low back pain. No fever. No dysuria.  I have reviewed the patient's chart and labs.  Questions were answered to the patient's satisfaction.     Festus Aloe

## 2020-05-18 NOTE — Anesthesia Postprocedure Evaluation (Signed)
Anesthesia Post Note  Patient: Wayne [name]  Procedure(s) Performed: TRANSURETHRAL RESECTION OF BLADDER TUMOR (TURBT) 0.5-2.0 cm BILATERAL RETROGRADE PYELOGRAM (N/A Bladder)     Anesthesia Type: General Pain management: pain level controlled Vital Signs Assessment: post-procedure vital signs reviewed and stable Respiratory status: spontaneous breathing Cardiovascular status: stable Postop Assessment: no apparent nausea or vomiting Anesthetic complications: no    Last Vitals:  Vitals:   05/18/20 0830 05/18/20 0845  BP: 132/83 128/76  Pulse: 66 64  Resp: 15 17  Temp:    SpO2: 99% 94%    Last Pain:  Vitals:   05/18/20 0845  TempSrc:   PainSc: 0-No pain   Pain Goal: Patients Stated Pain Goal: 3 (05/18/20 0636)                 Huston Foley

## 2020-05-18 NOTE — Anesthesia Procedure Notes (Signed)
Procedure Name: LMA Insertion Date/Time: 05/18/2020 7:36 AM Performed by: Sharlette Dense, CRNA Patient Re-evaluated:Patient Re-evaluated prior to induction Oxygen Delivery Method: Circle system utilized Preoxygenation: Pre-oxygenation with 100% oxygen Induction Type: IV induction LMA: LMA inserted LMA Size: 4.0 Number of attempts: 1 Placement Confirmation: positive ETCO2 and breath sounds checked- equal and bilateral Tube secured with: Tape Dental Injury: Teeth and Oropharynx as per pre-operative assessment

## 2020-05-21 ENCOUNTER — Encounter: Payer: Self-pay | Admitting: *Deleted

## 2020-05-21 LAB — SURGICAL PATHOLOGY

## 2020-05-28 ENCOUNTER — Other Ambulatory Visit: Payer: Self-pay | Admitting: Sports Medicine

## 2020-05-28 DIAGNOSIS — M545 Low back pain, unspecified: Secondary | ICD-10-CM

## 2020-05-28 DIAGNOSIS — M21372 Foot drop, left foot: Secondary | ICD-10-CM | POA: Diagnosis not present

## 2020-05-29 ENCOUNTER — Other Ambulatory Visit (HOSPITAL_COMMUNITY): Payer: Self-pay | Admitting: Cardiology

## 2020-05-29 MED ORDER — LOSARTAN POTASSIUM 25 MG PO TABS: ORAL_TABLET | ORAL | 0 refills | Status: DC

## 2020-06-06 DIAGNOSIS — C670 Malignant neoplasm of trigone of bladder: Secondary | ICD-10-CM | POA: Diagnosis not present

## 2020-06-13 ENCOUNTER — Ambulatory Visit
Admission: RE | Admit: 2020-06-13 | Discharge: 2020-06-13 | Disposition: A | Discharge status: Home / Self Care | Payer: Medicare Other | Source: Ambulatory Visit | Attending: Sports Medicine | Admitting: Sports Medicine

## 2020-06-13 ENCOUNTER — Other Ambulatory Visit: Payer: Self-pay | Admitting: Internal Medicine

## 2020-06-13 ENCOUNTER — Other Ambulatory Visit: Payer: Self-pay

## 2020-06-13 DIAGNOSIS — M545 Low back pain, unspecified: Secondary | ICD-10-CM

## 2020-06-14 ENCOUNTER — Ambulatory Visit (INDEPENDENT_AMBULATORY_CARE_PROVIDER_SITE_OTHER): Payer: Medicare Other | Admitting: *Deleted

## 2020-06-14 DIAGNOSIS — I255 Ischemic cardiomyopathy: Secondary | ICD-10-CM

## 2020-06-14 LAB — CUP PACEART REMOTE DEVICE CHECK
Battery Remaining Longevity: 88 mo
Battery Voltage: 2.99 V
Brady Statistic RV Percent Paced: 0.01 %
Date Time Interrogation Session: 20210701044224
HighPow Impedance: 81 Ohm
Implantable Lead Implant Date: 20180612
Implantable Lead Location: 753860
Implantable Lead Model: 6935
Implantable Pulse Generator Implant Date: 20180103
Lead Channel Impedance Value: 342 Ohm
Lead Channel Impedance Value: 437 Ohm
Lead Channel Pacing Threshold Amplitude: 1 V
Lead Channel Pacing Threshold Pulse Width: 0.4 ms
Lead Channel Sensing Intrinsic Amplitude: 14.125 mV
Lead Channel Setting Pacing Amplitude: 2.5 V
Lead Channel Setting Pacing Pulse Width: 0.4 ms
Lead Channel Setting Sensing Sensitivity: 0.3 mV

## 2020-06-15 ENCOUNTER — Telehealth (HOSPITAL_COMMUNITY): Payer: Self-pay | Admitting: Pharmacist

## 2020-06-15 NOTE — Telephone Encounter (Signed)
Received message from patient that Eliquis was costing him >$100/month and was not affordable. Will apply for manufacturer's assistance. I left patient assistance application at front desk for him to sign. Once completed, I will fax in to [employer]. Patient aware.   Audry Riles, PharmD, BCPS, BCCP, CPP [facility] Failure Clinic Pharmacist [phone]

## 2020-06-15 NOTE — Progress Notes (Signed)
Remote ICD transmission.   

## 2020-06-19 ENCOUNTER — Telehealth: Payer: Self-pay

## 2020-06-19 NOTE — Telephone Encounter (Signed)
Arsal is returning [employer] call.

## 2020-06-19 NOTE — Telephone Encounter (Signed)
LMOVM for pt to stop sending manual transmissions. 

## 2020-06-20 NOTE — Telephone Encounter (Signed)
Sent in Manufacturer's Assistance application to [employer] for Eliquis.    Application pending, will continue to follow.  Jessia Kief, PharmD, BCPS, BCCP, CPP Heart Failure Clinic Pharmacist [phone]   

## 2020-06-20 NOTE — Telephone Encounter (Signed)
LMOVM

## 2020-06-21 NOTE — Telephone Encounter (Signed)
Wayne [name] spoke with the pt and let him know not to send manual transmissions.

## 2020-06-23 ENCOUNTER — Other Ambulatory Visit (HOSPITAL_COMMUNITY): Payer: Self-pay | Admitting: Adult Health

## 2020-06-24 ENCOUNTER — Other Ambulatory Visit (HOSPITAL_COMMUNITY): Payer: Self-pay | Admitting: Adult Health

## 2020-07-02 ENCOUNTER — Other Ambulatory Visit (HOSPITAL_COMMUNITY): Payer: Self-pay | Admitting: Cardiology

## 2020-07-12 NOTE — Telephone Encounter (Signed)
Called [employer] to check on status of Eliquis patient assistance application. Representative was unable to find him in their system. She gave me an alternate fax number and I resent the application to them on 94/07/68.  Audry Riles, PharmD, BCPS, BCCP, CPP Heart Failure Clinic Pharmacist [phone]

## 2020-07-19 ENCOUNTER — Encounter (HOSPITAL_COMMUNITY): Payer: Self-pay | Admitting: Cardiology

## 2020-07-19 ENCOUNTER — Other Ambulatory Visit: Payer: Self-pay

## 2020-07-19 ENCOUNTER — Ambulatory Visit (HOSPITAL_COMMUNITY)
Admission: RE | Admit: 2020-07-19 | Discharge: 2020-07-19 | Disposition: A | Discharge status: Home / Self Care | Payer: Medicare Other | Source: Ambulatory Visit | Attending: Cardiology | Admitting: Cardiology

## 2020-07-19 ENCOUNTER — Ambulatory Visit (HOSPITAL_BASED_OUTPATIENT_CLINIC_OR_DEPARTMENT_OTHER)
Admission: RE | Admit: 2020-07-19 | Discharge: 2020-07-19 | Disposition: A | Discharge status: Home / Self Care | Payer: Medicare Other | Source: Ambulatory Visit | Attending: Cardiology | Admitting: Cardiology

## 2020-07-19 ENCOUNTER — Encounter: Payer: Medicare Other | Admitting: *Deleted

## 2020-07-19 VITALS — BP 114/60 | HR 66 | Resp 95 | Ht 68.0 in | Wt 240.0 lb

## 2020-07-19 VITALS — BP 114/60 | HR 66 | Wt 240.0 lb

## 2020-07-19 DIAGNOSIS — M353 Polymyalgia rheumatica: Secondary | ICD-10-CM | POA: Diagnosis not present

## 2020-07-19 DIAGNOSIS — K219 Gastro-esophageal reflux disease without esophagitis: Secondary | ICD-10-CM | POA: Diagnosis not present

## 2020-07-19 DIAGNOSIS — Z8674 Personal history of sudden cardiac arrest: Secondary | ICD-10-CM | POA: Diagnosis not present

## 2020-07-19 DIAGNOSIS — I255 Ischemic cardiomyopathy: Secondary | ICD-10-CM | POA: Insufficient documentation

## 2020-07-19 DIAGNOSIS — I480 Paroxysmal atrial fibrillation: Secondary | ICD-10-CM | POA: Diagnosis not present

## 2020-07-19 DIAGNOSIS — I130 Hypertensive heart and chronic kidney disease with heart failure and stage 1 through stage 4 chronic kidney disease, or unspecified chronic kidney disease: Secondary | ICD-10-CM | POA: Diagnosis not present

## 2020-07-19 DIAGNOSIS — I2510 Atherosclerotic heart disease of native coronary artery without angina pectoris: Secondary | ICD-10-CM | POA: Insufficient documentation

## 2020-07-19 DIAGNOSIS — Z794 Long term (current) use of insulin: Secondary | ICD-10-CM | POA: Diagnosis not present

## 2020-07-19 DIAGNOSIS — E785 Hyperlipidemia, unspecified: Secondary | ICD-10-CM | POA: Insufficient documentation

## 2020-07-19 DIAGNOSIS — Z9079 Acquired absence of other genital organ(s): Secondary | ICD-10-CM | POA: Diagnosis not present

## 2020-07-19 DIAGNOSIS — Z8546 Personal history of malignant neoplasm of prostate: Secondary | ICD-10-CM | POA: Diagnosis not present

## 2020-07-19 DIAGNOSIS — I5022 Chronic systolic (congestive) heart failure: Secondary | ICD-10-CM | POA: Diagnosis not present

## 2020-07-19 DIAGNOSIS — Z006 Encounter for examination for normal comparison and control in clinical research program: Secondary | ICD-10-CM

## 2020-07-19 DIAGNOSIS — Z87891 Personal history of nicotine dependence: Secondary | ICD-10-CM | POA: Insufficient documentation

## 2020-07-19 DIAGNOSIS — I4901 Ventricular fibrillation: Secondary | ICD-10-CM | POA: Insufficient documentation

## 2020-07-19 DIAGNOSIS — I350 Nonrheumatic aortic (valve) stenosis: Secondary | ICD-10-CM | POA: Diagnosis not present

## 2020-07-19 DIAGNOSIS — G4733 Obstructive sleep apnea (adult) (pediatric): Secondary | ICD-10-CM

## 2020-07-19 DIAGNOSIS — N183 Chronic kidney disease, stage 3 unspecified: Secondary | ICD-10-CM | POA: Insufficient documentation

## 2020-07-19 DIAGNOSIS — Z79899 Other long term (current) drug therapy: Secondary | ICD-10-CM | POA: Insufficient documentation

## 2020-07-19 DIAGNOSIS — M1990 Unspecified osteoarthritis, unspecified site: Secondary | ICD-10-CM | POA: Diagnosis not present

## 2020-07-19 DIAGNOSIS — Z955 Presence of coronary angioplasty implant and graft: Secondary | ICD-10-CM | POA: Insufficient documentation

## 2020-07-19 DIAGNOSIS — E1122 Type 2 diabetes mellitus with diabetic chronic kidney disease: Secondary | ICD-10-CM | POA: Insufficient documentation

## 2020-07-19 DIAGNOSIS — Z95810 Presence of automatic (implantable) cardiac defibrillator: Secondary | ICD-10-CM | POA: Insufficient documentation

## 2020-07-19 DIAGNOSIS — I472 Ventricular tachycardia: Secondary | ICD-10-CM | POA: Insufficient documentation

## 2020-07-19 DIAGNOSIS — Z7901 Long term (current) use of anticoagulants: Secondary | ICD-10-CM | POA: Diagnosis not present

## 2020-07-19 DIAGNOSIS — E669 Obesity, unspecified: Secondary | ICD-10-CM | POA: Insufficient documentation

## 2020-07-19 LAB — CBC
HCT: 47.6 % (ref 39.0–52.0)
Hemoglobin: 15 g/dL (ref 13.0–17.0)
MCH: 29.7 pg (ref 26.0–34.0)
MCHC: 31.5 g/dL (ref 30.0–36.0)
MCV: 94.3 fL (ref 80.0–100.0)
Platelets: 141 10*3/uL — ABNORMAL LOW (ref 150–400)
RBC: 5.05 MIL/uL (ref 4.22–5.81)
RDW: 13.9 % (ref 11.5–15.5)
WBC: 5.7 10*3/uL (ref 4.0–10.5)
nRBC: 0 % (ref 0.0–0.2)

## 2020-07-19 LAB — LIPID PANEL
Cholesterol: 176 mg/dL (ref 0–200)
HDL: 37 mg/dL — ABNORMAL LOW (ref >40)
LDL Cholesterol: 111 mg/dL — ABNORMAL HIGH (ref 0–99)
Total CHOL/HDL Ratio: 4.8 RATIO
Triglycerides: 139 mg/dL (ref <150)
VLDL: 28 mg/dL (ref 0–40)

## 2020-07-19 LAB — COMPREHENSIVE METABOLIC PANEL
ALT: 26 U/L (ref 0–44)
AST: 28 U/L (ref 15–41)
Albumin: 3.5 g/dL (ref 3.5–5.0)
Alkaline Phosphatase: 64 U/L (ref 38–126)
Anion gap: 10 (ref 5–15)
BUN: 21 mg/dL (ref 8–23)
CO2: 23 mmol/L (ref 22–32)
Calcium: 9.4 mg/dL (ref 8.9–10.3)
Chloride: 107 mmol/L (ref 98–111)
Creatinine, Ser: 1.4 mg/dL — ABNORMAL HIGH (ref 0.61–1.24)
GFR calc Af Amer: 56 mL/min — ABNORMAL LOW (ref >60)
GFR calc non Af Amer: 48 mL/min — ABNORMAL LOW (ref >60)
Glucose, Bld: 362 mg/dL — ABNORMAL HIGH (ref 70–99)
Potassium: 5.3 mmol/L — ABNORMAL HIGH (ref 3.5–5.1)
Sodium: 140 mmol/L (ref 135–145)
Total Bilirubin: 1.1 mg/dL (ref 0.3–1.2)
Total Protein: 6.4 g/dL — ABNORMAL LOW (ref 6.5–8.1)

## 2020-07-19 LAB — ECHOCARDIOGRAM COMPLETE
AR max vel: 1.05 cm2
AV Area VTI: 0.99 cm2
AV Area mean vel: 0.98 cm2
AV Mean grad: 11 mmHg
AV Peak grad: 17.4 mmHg
Ao pk vel: 2.09 m/s
Area-P 1/2: 3.6 cm2
Calc EF: 26.2 %
S' Lateral: 6 cm
Single Plane A2C EF: 16.8 %
Single Plane A4C EF: 33.2 %

## 2020-07-19 LAB — TSH: TSH: 2.382 u[IU]/mL (ref 0.350–4.500)

## 2020-07-19 MED ORDER — CARVEDILOL 12.5 MG PO TABS: 18.7500 mg | ORAL_TABLET | Freq: Two times a day (BID) | ORAL | 3 refills | Status: DC

## 2020-07-19 NOTE — Progress Notes (Signed)
  Echocardiogram 2D Echocardiogram has been performed.  Wayne [name] 07/19/2020, 12:16 PM

## 2020-07-19 NOTE — Progress Notes (Signed)
Patient ID: BOLDEN HAGERMAN, male   DOB: 16-Oct-1941, 79 y.o.   MRN: [id number]   Advanced Heart Failure Clinic Note   Patient ID: LUCCAS TOWELL, male   DOB: [birth year], 79 y.o.   MRN: [id number] PCP: Dr. Elease Hashimoto Cardiology: Dr. Earline Mayotte DONDRE CATALFAMO is a 79 y.o. male  with history of DM, HTN, CAD, and ischemic cardiomyopathy who presents for followup of CHF and CAD. Patient had a CHF exacerbation in 2/12 and was found to have LV systolic dysfunction with EF around 20%. LHC showed RCA, ramus, and CFX disease. RCA was subtotally occluded. Cardiac MRI showed that all walls, including the inferior wall, should be viable. Patient therefore underwent opening of his chronic totally occluded RCA as well as PCI to the ramus in 2/12. He received drug eluting stents and was on Effient.  He had an echo in 2/13 that showed EF improved to 45% with moderate LV dilation and mild LV hypertrophy.   In 5/13, he developed a large left lower leg hematoma.  He was still on Effient at that time.  ASA and Effient were stopped.  The hematoma did not resolve.  In 7/13, he was re-admitted with septic shock from MSSA from an abscess in his left gastrocnemius.  He also grew Pseudomonas from the left gastrocnemius as well.  He had a prolonged course in the hospital and later in a rehab facility.  Ultimately, he got back home again.  I had him get an echo in 1/14, and this showed EF 15% with diffuse hypokinesis and inferoposterior akinesis.  He had been off of most of his prior cardiac medications.  Took him back for [facility] in 1/14.  This showed subtotal occlusion of a small AV LCx and 95% ostial in-stent restenosis in the RCA.  He was treated in 2/14 with a Xience DES to the ostial RCA and begun on Plavix.  Unfortunately, he developed diffuse hives after starting Plavix and had to be switched to ticlopidine.  He has tolerated ticlopidine.   Echo done in 12/14 showed some improvement in LV function but EF was still low at 30-35%.  He did not want  ICD.    Presented to ED 11/14/15 with worsening SOB after a few steps and CXR with CHF and bilateral effusions in the setting of marked medical non-compliance. States he stopped taking his medicines in 01/2015 (except for ASA 81 and a multivitamin).  He was feeling good and decided that he did not need them anymore.  Also c/o URI symptoms. Diuresed over 2 L with IV diuretics in the ER and started on lasix 20 mg daily.   Echo (12/16) showed EF 20-25% with diffuse hypokinesis (down from 35% in 12/15).  Echo 10/17 showed EF 15% with mildly decreased RV systolic function.    He had left and right heart catheterization in 12/17. This showed elevated right and left heart filling pressures and low cardiac output. The RCA was totally occluded with collaterals, there was 95% ostial to mid LAD stenosis.  Patient had PCI with DES to proximal-mid LAD.    He was noted to be in atrial fibrillation with RVR in 2/18 at cardiac rehab. He felt fatigued.  He was admitted and started on amiodarone for rate control. ASA was stopped, ticagrelor was decreased to 60 mg bid, and Xarelto 15 mg daily was started.  He converted spontaneously back to NSR.    He suffered a ventricular fibrillation arrest in 3/18.  Luckily, he was wearing  a Lifevest and was shocked.  He was admitted and started on amiodarone.  Medtronic ICD was placed.  Echo 3/18 with EF 20-25%.   Admitted again 6/18 due to VF arrest. ICD lead had been undersensing leading to prolongation of time to discharge.  He underwent RV lead extraction with new lead implant.   He was admitted again with VT storm in 7/18. He had coronary angiography this admission.  Had cutting balloon angioplasty of severe in-stent restenosis in the proximal LAD.  He also had VT ablation in 7/18.   He had TURBT 11/18, urothelial cancer diagnosed.  TURBT again in 6/21.   Echo was done today and reviewed, EF 25%, diffuse hypokinesis, moderate LV dilation, normal RV, IVC normal.   He  returns today for followup of CHF.  Main complaint is low back pain with sciatica and left foot drop.  He has had falls due to the foot drop and will need to get a brace.  No dyspnea walking on flat ground though not very active due to back pain, foot drop.  No orthopnea/PND.  No chest pain.  No lightheadedness.  He is not taking Lasix every day, takes it several days a week. He is not using CPAP, cannot tolerate full face mask. He has daytime fatigue/sleepiness.   ECG (personally reviewed): NSR, IVCD 150 msec  Labs (12/16): K 4.9, creatinine 1.23 Labs (1/17): K 4.7, creatinine 1.15, BNP 1433 Labs (2/17): K 4.7, creatinine 1.14, BNP 870 Labs (3/17): K 4.5, creatinine 1.10, BNP 1257 Labs (5/17): K 4.2, creatinine 1.2, BNP 818 Labs (7/17): LDL 149, HDL 40, TGs 210 Labs (9/17): K 4.9, creatinine 1.36 Labs (10/17): K 3.7, creatinine 1.2, LDL 87, HDL 32, LFTs normal Labs (12/17): K 4.5, creatinine 1.34, digoxin 0.3 Labs (2/18): K 4.4, creatinine 1.48, hgb 11.8 Labs (3/18): K 5 => 4.6, creatinine 1.54 => 1.46, digoxin 1.3 => 0.3 Labs (5/18): TSH normal Labs (7/18): K 3.7, creatinine 1.33, hgb 11.4, digoxin 0.6, LFTs normal Labs (8/18): K 5.1, creatinine 1.47, hgb 13.3 Labs (10/18): LDL 50, HDL 40, TGs 247 Labs (11/18): K 5.9, creatinine 1.42, hgb 15.2 Labs (12/18): K 5, creatinine 1.27, digoxin 0.6, LFTs normal, TSH normal Labs (3/19): LDL 86, HDL 45 Labs (6/19): K 4.1, creatinine 1.34, LFTs normal Labs (6/21): K 5, creatinine 1.3  Past Medical History:  1. HYPERTENSION  2. HYPERLIPIDEMIA: Myalgias with atorvastatin and Crestor 3. ECZEMA  4. RHINITIS  5. HERPES ZOSTER OPHTHALMICUS  6. ADENOCARCINOMA, PROSTATE: Status post prostatectomy in 2009. Has had some incontinence since then.  7. Diabetes mellitus type II  8. Arthritis  9. Obesity  10. GERD: rare  11. CAD: Presented with exertional dyspnea, never had chest pain. LHC (2/12) with subtotalled proximal RCA and left to right  collaterals, 90% proximal moderate-sized ramus, 95% proximal relatively small CFX, 40-50% proximal LAD. Cardiac MRI (2/12) showed EF 21%, some mild scar in basal segments but all wall segments would be expected to be viable. DES x 5 (overlapping) to RCA, DES x 1 to RI 01/30/11 .  Bled into leg with Effient use (long, complicated course).  LHC (1/14): AV LCx small with subtotal occlusion, 95% ostial instent restenosis in RCA. PCI in 2/14 to ostial RCA with 3.5 x 15 Xience DES.  Plavix allergy (hives) so put on ticlopidine.  - LHC (12/17):  the RCA was totally occluded with collaterals, there was 95% ostial to mid LAD stenosis, patient had PCI with DES to proximal-mid LAD. - LHC (7/18): Totally  occluded RCA with left to right collaterals, subtotal occlusion of small AV LCx, 90% ISR proximal LAD => cutting balloon angioplasty.  12. Ischemic CMP: Echo (2/12) with moderately dilated LV, EF about 20% with diffuse hypokinesis and inferior akinesis, pseudonormal diastolic function, mild MR, severe LAE, mildly decreased RV systolic function. RHC (2/12) with mean RA 12, PA 40/25, mean PCWP 26, CI 2.1.  Echo (5/12) with EF 40% (appeared worse to my eye) with posterior HK, basal inferior AK, inferoseptal AK, basal anteroseptal AK, mild MR.  Cardiac MRI was repeated and showed EF 32% (improved from 21%) and mild LV dilation (was severely dilated before) with diffuse hypokinesis and subendocardial scar in the basal inferior, basal posterior, and basal anterolateral segments.  Echo (2/13) with EF 45%, moderate LV dilation, mild LVH.  Echo (1/14) with EF 15%, diffuse hypokinesis, inferoposterior akinesis, mild MR, grade I diastolic dysfunction.  Echo (5/14) with EF 30-35%, mild LV dilation, akinesis of the basal inferior wall otherwise diffuse hypokinesis.  Echo (12/14) with EF 30-35%, mild LV dilation, diffuse hypokinesis with basal inferior and posterior akinesis. Echo (12/15) with EF 35%, mildly dilated LV, wall motion  abnormalities, normal RV size and systolic function. Echo (12/16) with EF 20-25%, diffuse hypokinesis, severe LV dilation, mild MR.  - Echo (10/17): EF 15%, grade II diastolic dysfunction, mild MR, mildly decreased RV systolic function.  - Possible angioedema related to Entresto.  - Hyperkalemia with spironolactone 12.5 daily.  - CPX (10/17): peak VO2 10.6, VE/VCO2 slope 48.6, RER 1.12.  Severe HF limitation.  - RHC (12/17): mean RA 14, PA 53/27, mean PCWP 25, CI 1.93.  - Echo (3/18): EF 20-25%, moderate LV dilation, mild LVH, normal RV size and systolic function, mild aortic stenosis.  - Medtronic ICD 3/18.  - Echo (6/18): EF 25-30%, mild AS.  - Echo (7/19): Moderate LV dilation, EF 25-30%, diffuse hypokinesis with inferior AK, normal RV size and systolic function, mild AS.  - Echo (8/21): EF 25%, diffuse hypokinesis with moderate dilation, normal RV, IVC normal.  13. Cervical OA.  14. Polymyalgia rheumatica 15. OSA: Severe on sleep study 10/12.  On CPAP.  16. Left lower leg hematoma in setting of Effient use.  He developed a left gastrocnemius abscess and septic shock with prolonged hospitalization beginning in 7/13.  17. PAD: Lower extremity arterial doppler evaluation in 2/17 showed occluded peroneal arteries bilaterally, ABI 1.1 (R) and 1.0 (L).   - ABIs normal in 1/19.  18. Carotid dopplers (10/17) with minimal disease.  19. Atrial fibrillation: Paroxysmal.  20. Ventricular fibrillation arrest: 3/18.  Medtronic ICD placed, now on amiodarone.  - VT storm 7/18, now s/p VT ablation.  21. Aortic stenosis: Mild on 6/18 echo and 7/19 echo.  22. Urothelial cancer: s/p TURBT of bladder tumor 11/18 and 6/21.    Family History:  Father died with MI at age 6. He was an alcoholic. Mother died with cancer at around 81.   Social History:  Occupation: retired Administrator  Married, lives in [place]  Past smoker, quit around [place] of systems complete and found to be negative unless  listed in HPI.      Current Outpatient Medications  Medication Sig Dispense Refill  . amiodarone (PACERONE) 200 MG tablet TAKE 1 TABLET BY MOUTH DAILY FROM [facility] THROUGH SATURDAY. DO NOT TAKE IT ON SUNDAY 90 tablet 2  . apixaban (ELIQUIS) 5 MG TABS tablet Take 1 tablet (5 mg total) by mouth 2 (two) times daily. 60 tablet  11  . carvedilol (COREG) 12.5 MG tablet Take 1.5 tablets (18.75 mg total) by mouth 2 (two) times daily with a meal. 90 tablet 3  . diphenhydramine-acetaminophen (TYLENOL PM) 25-500 MG TABS tablet Take 1 tablet at bedtime as needed by mouth (for sleep).     . furosemide (LASIX) 20 MG tablet TAKE 1 TABLET(20 MG) BY MOUTH TWICE DAILY 180 tablet 3  . glucose blood (ACCU-CHEK AVIVA PLUS) test strip USE TO CHECK BLOOD SUGAR ONCE TO TWICE DAILY 200 strip 1  . insulin detemir (LEVEMIR) 100 unit/ml SOLN Start 10 units [place] qhs and titrate up 2 units every 3 days for fasting blood sugars consistently > 130 (Patient taking differently: Inject 10 Units into the skin daily as needed (for blood sugars>130.). Start 10 units [place] qhs and titrate up 2 units every 3 days for fasting blood sugars consistently > 130) 3 mL 5  . losartan (COZAAR) 25 MG tablet TAKE 1 TABLET BY MOUTH TWICE DAILY 180 tablet 2  . Multiple Vitamin (MULTIVITAMIN WITH MINERALS) TABS tablet Take 1 tablet by mouth daily. Centrum Silver Adult 73+    . Omega-3 Fatty Acids (FISH OIL) 1000 MG CAPS Take 1,000 mg by mouth 2 (two) times daily.     . rosuvastatin (CRESTOR) 10 MG tablet Take 1 tablet (10 mg total) by mouth daily. 90 tablet 3  . spironolactone (ALDACTONE) 25 MG tablet Take 0.5 tablets (12.5 mg total) by mouth daily. 30 tablet 6  . tamsulosin (FLOMAX) 0.4 MG CAPS capsule Take 0.4 mg by mouth every other day. At bedtime    . TRUEplus Lancets 30G MISC USE TO CHECK BLOOD SUGAR EVERY DAY TO TWICE DAILY 100 each 1   No current facility-administered medications for this encounter.    Vitals:   07/19/20 1155  BP: 114/60   Pulse: 66  Resp: (!) 95  Weight: 108.9 kg (240 lb)  Height: 5\' 8"  (1.727 m)   Wt Readings from Last 3 Encounters:  07/19/20 108.9 kg (240 lb)  05/18/20 111.3 kg (245 lb 6 oz)  05/16/20 111.3 kg (245 lb 6 oz)   General: NAD, obese.  Neck: No JVD, no thyromegaly or thyroid nodule.  Lungs: Clear to auscultation bilaterally with normal respiratory effort. CV: Nondisplaced PMI.  Heart regular S1/S2, no S3/S4, no murmur.  No peripheral edema.  No carotid bruit.  Normal pedal pulses.  Abdomen: Soft, nontender, no hepatosplenomegaly, no distention.  Skin: Intact without lesions or rashes.  Neurologic: Alert and oriented x 3.  Psych: Normal affect. Extremities: No clubbing or cyanosis.  HEENT: Normal.    Assessment/Plan:  1. Chronic systolic CHF: Ischemic cardiomyopathy.  6/18 echo with EF 25-30%, diffuse hypokinesis.  CPX in 10/17 with severe HF limitation.  RHC in 12/17 with elevated filling pressures and low cardiac output.  Medtronic ICD in 3/18 after vfib arrest. He had VT again in 6/18, then VT storm in 7/18 now s/p VT ablation. Echo was done today and reviewed, EF 25% with normal RV (stable). NYHA class II symptoms, more limited by foot drop. He is not volume overloaded on exam.  - Continue lasix 20 mg BID, need to be regular with this.  - Increase Coreg to 18.75 mg bid.  - Continue spironolactone 12.5 mg daily, will not increase with upper normal K.  - Continue losartan 25 mg bid. He had angioedema with Entresto.   - He is off digoxin and would like to remain off - QRS wider today, 150 msec, but has  IVCD that does not appear to be true LBBB.  Will discuss with EP, but suspect he would not be likely to benefit much from CRT upgrade.  - I will arrange for CPX, probably will need to ride the bike.  - He may be an LVAD candidate in the future.   - Reinforced fluid restriction to < 2 L daily, sodium restriction to less than 2000 mg daily, and the importance of daily weights.   2. CAD:   Now s/p cutting balloon angioplasty to in-stent restenosis in proximal LAD in 7/18.  He has an occluded RCA but there were left to right collaterals.  No chest pain.  - He had been on Repatha but stopped because insurance was not covering.    - Continue Crestor 10 mg daily. Check lipids today, if LDL > 70, will refer back to the lipid clinic to again look into PCSK9-inhibitor.  - He is not on ASA given Xarelto use.  3. HLD: See discussion above.  4. PAD: Normal ABIs in 1/19. No claudication.  5. Diabetes type II: No longer on empagliflozin.  SGLT2 inhibitor would be a good option in the future.  6. Atrial fibrillation: NSR today on amiodarone 200 mg daily.  - Continue Xarelto 20 mg daily. CBC today.  - Continue amiodarone 200 mg daily. Check LFTs/TSH.  He will need regular eye exams.  - Would consider atrial fibrillation ablation based on CASTLE-HF data if AF recurs. 7. Ventricular fibrillation arrest: On amiodarone and has Medtronic ICD.  He had VT ablation in 7/18 after episode of VT storm.  - Continue amiodarone.  8. OSA:  Not using CPAP, cannot tolerate full face mask.  He has severe OSA.  - Will refer back to sleep medicine to see if there is a form of CPAP he will tolerate.   9. CKD: Stage 3. BMET today.   Loralie Champagne 07/19/2020

## 2020-07-19 NOTE — Patient Instructions (Signed)
Increase Carvedilol to 18.75 mg (1 & 1/2 tabs) Twice daily   Take your Furosemide EVERY DAY  Labs done today, we will call you for abnormal results  Your physician has recommended that you have a cardiopulmonary stress test (CPX). CPX testing is a non-invasive measurement of heart and lung function. It replaces a traditional treadmill stress test. This type of test provides a tremendous amount of information that relates not only to your present condition but also for future outcomes. This test combines measurements of you ventilation, respiratory gas exchange in the lungs, electrocardiogram (EKG), blood pressure and physical response before, during, and following an exercise protocol.  Your provider has recommended that you have a home sleep study.  [employer] is the company that does these test.  They will contact you by phone and must speak with you before they can ship the equipment.  Once they have spoken with you they will send the equipment right to your home with instructions on how to set it up.  Once you have completed the test simply box all the equipment back up and mail back to the company.  IF you have any questions or issues with the equipment please call the company directly at [phone].  If your test is positive for sleep apnea and you need a home CPAP machine you will be contacted by Dr Theodosia Blender office [facility]) to set this up.  Your physician recommends that you schedule a follow-up appointment in: 3 months  If you have any questions or concerns before your next appointment please send [place] a message through [place] or call our office at [phone].    TO LEAVE A MESSAGE FOR THE NURSE SELECT OPTION 2, PLEASE LEAVE A MESSAGE INCLUDING: . YOUR NAME . DATE OF BIRTH . CALL BACK NUMBER . REASON FOR CALL**this is important as we prioritize the call backs  [place] AS LONG AS YOU CALL BEFORE 4:00 PM  At the [facility],  you and your health needs are our priority. As part of our continuing mission to provide you with exceptional heart care, we have created designated Provider Care Teams. These Care Teams include your primary Cardiologist (physician) and Advanced Practice Providers (APPs- Physician Assistants and Nurse Practitioners) who all work together to provide you with the care you need, when you need it.   You may see any of the following providers on your designated Care Team at your next follow up: Marland Kitchen Dr Glori Bickers . Dr Loralie Champagne . Darrick Grinder, NP . Lyda Jester, PA . Audry Riles, PharmD   Please be sure to bring in all your medications bottles to every appointment.

## 2020-07-20 ENCOUNTER — Telehealth (HOSPITAL_COMMUNITY): Payer: Self-pay

## 2020-07-20 ENCOUNTER — Telehealth (HOSPITAL_COMMUNITY): Payer: Self-pay | Admitting: Pharmacy Technician

## 2020-07-20 DIAGNOSIS — I5022 Chronic systolic (congestive) heart failure: Secondary | ICD-10-CM

## 2020-07-20 MED ORDER — ROSUVASTATIN CALCIUM 20 MG PO TABS: 20.0000 mg | ORAL_TABLET | Freq: Every day | ORAL | 11 refills | Status: DC

## 2020-07-20 NOTE — Telephone Encounter (Signed)
Patient advised and verbalized understanding. New rx sent in,lab order entered,lab appt scheduled.    Meds ordered this encounter  Medications  . rosuvastatin (CRESTOR) 20 MG tablet    Sig: Take 1 tablet (20 mg total) by mouth daily.    Dispense:  30 tablet    Refill:  11    Please cancel all previous orders for current medication. Change in dosage or pill size.   Orders Placed This Encounter  Procedures  . Basic Metabolic Panel (BMET)    Standing Status:   Future    Standing Expiration Date:   07/20/2021    Order Specific Question:   Release to patient    Answer:   Immediate

## 2020-07-20 NOTE — Telephone Encounter (Signed)
Called [employer] to check the status of the patient's application.  When I called earlier this week, the representative stated that he needed to spend $222 (which is on the OOP expense report that was sent in with the application). Today when I called the representative stated that he still needed to spend $222 in order to qualify.  Considering the discrepancy in what I was told, I asked for this to be looked at again. The representative sent the application back to the processing team to ensure this new information is correct.  Will call next week for follow up.

## 2020-07-25 NOTE — Telephone Encounter (Signed)
Called patient regarding denial. He is going to bring in his wife's OOP expense report for the year as well in an effort to get the foundation to include that total as well. Left a month of Eliquis samples for him at the check in desk.   LOT: [id number] EXP: 07/2022  Will follow up.

## 2020-08-01 NOTE — Telephone Encounter (Signed)
Sent in patient's updated OOP report, along with his wife's to [employer].  Will follow up.

## 2020-08-02 ENCOUNTER — Ambulatory Visit (HOSPITAL_COMMUNITY)
Admission: RE | Admit: 2020-08-02 | Discharge: 2020-08-02 | Disposition: A | Discharge status: Home / Self Care | Payer: Medicare Other | Source: Ambulatory Visit | Attending: Internal Medicine | Admitting: Internal Medicine

## 2020-08-02 ENCOUNTER — Other Ambulatory Visit: Payer: Self-pay

## 2020-08-02 DIAGNOSIS — I5022 Chronic systolic (congestive) heart failure: Secondary | ICD-10-CM | POA: Diagnosis not present

## 2020-08-02 LAB — BASIC METABOLIC PANEL
Anion gap: 11 (ref 5–15)
BUN: 24 mg/dL — ABNORMAL HIGH (ref 8–23)
CO2: 26 mmol/L (ref 22–32)
Calcium: 9.7 mg/dL (ref 8.9–10.3)
Chloride: 100 mmol/L (ref 98–111)
Creatinine, Ser: 1.47 mg/dL — ABNORMAL HIGH (ref 0.61–1.24)
GFR calc Af Amer: 53 mL/min — ABNORMAL LOW (ref >60)
GFR calc non Af Amer: 46 mL/min — ABNORMAL LOW (ref >60)
Glucose, Bld: 322 mg/dL — ABNORMAL HIGH (ref 70–99)
Potassium: 5.1 mmol/L (ref 3.5–5.1)
Sodium: 137 mmol/L (ref 135–145)

## 2020-08-07 NOTE — Telephone Encounter (Signed)
Spoke with [employer]. 2020 and 2021 expenses were submitted. The 2020 expenses cannot be used. He still has $218 to meet. Will confirm the expenses on the report that was just sent in.

## 2020-08-13 ENCOUNTER — Ambulatory Visit (HOSPITAL_COMMUNITY): Payer: Medicare Other | Attending: Internal Medicine

## 2020-08-13 ENCOUNTER — Other Ambulatory Visit: Payer: Self-pay

## 2020-08-13 DIAGNOSIS — I5022 Chronic systolic (congestive) heart failure: Secondary | ICD-10-CM | POA: Diagnosis not present

## 2020-08-14 NOTE — Research (Signed)
67M  Pt doing well.   Current Outpatient Medications:  .  amiodarone (PACERONE) 200 MG tablet, TAKE 1 TABLET BY MOUTH DAILY FROM MONDAY THROUGH SATURDAY. DO NOT TAKE IT ON SUNDAY, Disp: 90 tablet, Rfl: 2 .  apixaban (ELIQUIS) 5 MG TABS tablet, Take 1 tablet (5 mg total) by mouth 2 (two) times daily., Disp: 60 tablet, Rfl: 11 .  carvedilol (COREG) 12.5 MG tablet, Take 1.5 tablets (18.75 mg total) by mouth 2 (two) times daily with a meal., Disp: 90 tablet, Rfl: 3 .  diphenhydramine-acetaminophen (TYLENOL PM) 25-500 MG TABS tablet, Take 1 tablet at bedtime as needed by mouth (for sleep). , Disp: , Rfl:  .  furosemide (LASIX) 20 MG tablet, TAKE 1 TABLET(20 MG) BY MOUTH TWICE DAILY, Disp: 180 tablet, Rfl: 3 .  glucose blood (ACCU-CHEK AVIVA PLUS) test strip, USE TO CHECK BLOOD SUGAR ONCE TO TWICE DAILY, Disp: 200 strip, Rfl: 1 .  insulin detemir (LEVEMIR) 100 unit/ml SOLN, Start 10 units [place] qhs and titrate up 2 units every 3 days for fasting blood sugars consistently > 130 (Patient taking differently: Inject 10 Units into the skin daily as needed (for blood sugars>130.). Start 10 units [place] qhs and titrate up 2 units every 3 days for fasting blood sugars consistently > 130), Disp: 3 mL, Rfl: 5 .  losartan (COZAAR) 25 MG tablet, TAKE 1 TABLET BY MOUTH TWICE DAILY, Disp: 180 tablet, Rfl: 2 .  Multiple Vitamin (MULTIVITAMIN WITH MINERALS) TABS tablet, Take 1 tablet by mouth daily. Centrum Silver Adult 50+, Disp: , Rfl:  .  Omega-3 Fatty Acids (FISH OIL) 1000 MG CAPS, Take 1,000 mg by mouth 2 (two) times daily. , Disp: , Rfl:  .  rosuvastatin (CRESTOR) 20 MG tablet, Take 1 tablet (20 mg total) by mouth daily., Disp: 30 tablet, Rfl: 11 .  spironolactone (ALDACTONE) 25 MG tablet, Take 0.5 tablets (12.5 mg total) by mouth daily., Disp: 30 tablet, Rfl: 6 .  tamsulosin (FLOMAX) 0.4 MG CAPS capsule, Take 0.4 mg by mouth every other day. At bedtime, Disp: , Rfl:  .  TRUEplus Lancets 30G MISC, USE TO CHECK BLOOD  SUGAR EVERY DAY TO TWICE DAILY, Disp: 100 each, Rfl: 1

## 2020-08-28 ENCOUNTER — Other Ambulatory Visit (HOSPITAL_COMMUNITY): Payer: Self-pay | Admitting: *Deleted

## 2020-08-28 MED ORDER — APIXABAN 5 MG PO TABS: 5.0000 mg | ORAL_TABLET | Freq: Two times a day (BID) | ORAL | 0 refills | Status: DC

## 2020-08-31 NOTE — Telephone Encounter (Signed)
Spoke with patient, he is going to fill two months of medication and then contact me when that reflects on his OOP expense form to send in to [employer].  Charlann Boxer, CPhT

## 2020-09-03 DIAGNOSIS — Z8551 Personal history of malignant neoplasm of bladder: Secondary | ICD-10-CM | POA: Diagnosis not present

## 2020-09-13 ENCOUNTER — Ambulatory Visit (INDEPENDENT_AMBULATORY_CARE_PROVIDER_SITE_OTHER): Payer: Medicare Other

## 2020-09-13 DIAGNOSIS — I5022 Chronic systolic (congestive) heart failure: Secondary | ICD-10-CM

## 2020-09-13 DIAGNOSIS — I255 Ischemic cardiomyopathy: Secondary | ICD-10-CM

## 2020-09-13 LAB — CUP PACEART REMOTE DEVICE CHECK
Battery Remaining Longevity: 82 mo
Battery Voltage: 2.99 V
Brady Statistic RV Percent Paced: 0.02 %
Date Time Interrogation Session: 20210930043722
HighPow Impedance: 70 Ohm
Implantable Lead Implant Date: 20180612
Implantable Lead Location: 753860
Implantable Lead Model: 6935
Implantable Pulse Generator Implant Date: 20180103
Lead Channel Impedance Value: 342 Ohm
Lead Channel Impedance Value: 437 Ohm
Lead Channel Pacing Threshold Amplitude: 0.875 V
Lead Channel Pacing Threshold Pulse Width: 0.4 ms
Lead Channel Sensing Intrinsic Amplitude: 14 mV
Lead Channel Sensing Intrinsic Amplitude: 14 mV
Lead Channel Setting Pacing Amplitude: 2.5 V
Lead Channel Setting Pacing Pulse Width: 0.4 ms
Lead Channel Setting Sensing Sensitivity: 0.3 mV

## 2020-09-17 NOTE — Progress Notes (Signed)
Remote ICD transmission.   

## 2020-10-04 DIAGNOSIS — N3001 Acute cystitis with hematuria: Secondary | ICD-10-CM | POA: Diagnosis not present

## 2020-10-04 DIAGNOSIS — C672 Malignant neoplasm of lateral wall of bladder: Secondary | ICD-10-CM | POA: Diagnosis not present

## 2020-10-04 DIAGNOSIS — R3914 Feeling of incomplete bladder emptying: Secondary | ICD-10-CM | POA: Diagnosis not present

## 2020-10-04 DIAGNOSIS — R310 Gross hematuria: Secondary | ICD-10-CM | POA: Diagnosis not present

## 2020-10-11 DIAGNOSIS — C678 Malignant neoplasm of overlapping sites of bladder: Secondary | ICD-10-CM | POA: Diagnosis not present

## 2020-10-11 DIAGNOSIS — R338 Other retention of urine: Secondary | ICD-10-CM | POA: Diagnosis not present

## 2020-10-19 ENCOUNTER — Encounter (HOSPITAL_COMMUNITY): Payer: Medicare Other | Admitting: Cardiology

## 2020-11-05 DIAGNOSIS — R310 Gross hematuria: Secondary | ICD-10-CM | POA: Diagnosis not present

## 2020-11-26 DIAGNOSIS — S99921A Unspecified injury of right foot, initial encounter: Secondary | ICD-10-CM | POA: Diagnosis not present

## 2020-12-03 DIAGNOSIS — Z8551 Personal history of malignant neoplasm of bladder: Secondary | ICD-10-CM | POA: Diagnosis not present

## 2020-12-03 DIAGNOSIS — R310 Gross hematuria: Secondary | ICD-10-CM | POA: Diagnosis not present

## 2020-12-03 DIAGNOSIS — N35012 Post-traumatic membranous urethral stricture: Secondary | ICD-10-CM | POA: Diagnosis not present

## 2020-12-11 ENCOUNTER — Other Ambulatory Visit (HOSPITAL_COMMUNITY): Payer: Self-pay

## 2020-12-11 MED ORDER — AMIODARONE HCL 200 MG PO TABS: ORAL_TABLET | ORAL | 0 refills | Status: DC

## 2020-12-13 ENCOUNTER — Ambulatory Visit (INDEPENDENT_AMBULATORY_CARE_PROVIDER_SITE_OTHER): Payer: Medicare Other

## 2020-12-13 DIAGNOSIS — I255 Ischemic cardiomyopathy: Secondary | ICD-10-CM | POA: Diagnosis not present

## 2020-12-13 DIAGNOSIS — I5022 Chronic systolic (congestive) heart failure: Secondary | ICD-10-CM

## 2020-12-13 LAB — CUP PACEART REMOTE DEVICE CHECK
Battery Remaining Longevity: 78 mo
Battery Voltage: 2.99 V
Brady Statistic RV Percent Paced: 0.01 %
Date Time Interrogation Session: 20211230044225
HighPow Impedance: 75 Ohm
Implantable Lead Implant Date: 20180612
Implantable Lead Location: 753860
Implantable Lead Model: 6935
Implantable Pulse Generator Implant Date: 20180103
Lead Channel Impedance Value: 342 Ohm
Lead Channel Impedance Value: 399 Ohm
Lead Channel Pacing Threshold Amplitude: 1.125 V
Lead Channel Pacing Threshold Pulse Width: 0.4 ms
Lead Channel Sensing Intrinsic Amplitude: 12.625 mV
Lead Channel Sensing Intrinsic Amplitude: 12.625 mV
Lead Channel Setting Pacing Amplitude: 2.5 V
Lead Channel Setting Pacing Pulse Width: 0.4 ms
Lead Channel Setting Sensing Sensitivity: 0.3 mV

## 2020-12-27 ENCOUNTER — Telehealth (HOSPITAL_COMMUNITY): Payer: Self-pay | Admitting: Pharmacy Technician

## 2020-12-27 NOTE — Telephone Encounter (Signed)
Spoke with patient today regarding Eliquis co-pay. His current 30 day co-pay is $47. He says that he can afford that. He is aware that when he spends 3% OOP on his medication, that we can then apply for assistance.  Advised him to send me his OOP once that is met. If his co-pay changes before that, we may be able to provide samples.  Charlann Boxer, CPhT

## 2020-12-28 NOTE — Progress Notes (Signed)
Remote ICD transmission.   

## 2021-01-08 DIAGNOSIS — N35012 Post-traumatic membranous urethral stricture: Secondary | ICD-10-CM | POA: Diagnosis not present

## 2021-01-08 DIAGNOSIS — R310 Gross hematuria: Secondary | ICD-10-CM | POA: Diagnosis not present

## 2021-01-15 ENCOUNTER — Ambulatory Visit (HOSPITAL_COMMUNITY)
Admission: RE | Admit: 2021-01-15 | Discharge: 2021-01-15 | Disposition: A | Discharge status: Home / Self Care | Payer: Medicare Other | Source: Ambulatory Visit | Attending: Cardiology | Admitting: Cardiology

## 2021-01-15 ENCOUNTER — Encounter: Payer: Self-pay | Admitting: *Deleted

## 2021-01-15 ENCOUNTER — Encounter (HOSPITAL_COMMUNITY): Payer: Self-pay | Admitting: Cardiology

## 2021-01-15 ENCOUNTER — Other Ambulatory Visit: Payer: Self-pay

## 2021-01-15 ENCOUNTER — Other Ambulatory Visit (HOSPITAL_COMMUNITY): Payer: Self-pay | Admitting: Cardiology

## 2021-01-15 VITALS — BP 140/80 | HR 66 | Wt 235.6 lb

## 2021-01-15 DIAGNOSIS — I255 Ischemic cardiomyopathy: Secondary | ICD-10-CM | POA: Insufficient documentation

## 2021-01-15 DIAGNOSIS — E785 Hyperlipidemia, unspecified: Secondary | ICD-10-CM | POA: Diagnosis not present

## 2021-01-15 DIAGNOSIS — N183 Chronic kidney disease, stage 3 unspecified: Secondary | ICD-10-CM | POA: Insufficient documentation

## 2021-01-15 DIAGNOSIS — Z888 Allergy status to other drugs, medicaments and biological substances status: Secondary | ICD-10-CM | POA: Diagnosis not present

## 2021-01-15 DIAGNOSIS — Z87891 Personal history of nicotine dependence: Secondary | ICD-10-CM | POA: Insufficient documentation

## 2021-01-15 DIAGNOSIS — G4733 Obstructive sleep apnea (adult) (pediatric): Secondary | ICD-10-CM | POA: Diagnosis not present

## 2021-01-15 DIAGNOSIS — Z95810 Presence of automatic (implantable) cardiac defibrillator: Secondary | ICD-10-CM | POA: Diagnosis not present

## 2021-01-15 DIAGNOSIS — Z79899 Other long term (current) drug therapy: Secondary | ICD-10-CM | POA: Insufficient documentation

## 2021-01-15 DIAGNOSIS — I447 Left bundle-branch block, unspecified: Secondary | ICD-10-CM | POA: Diagnosis not present

## 2021-01-15 DIAGNOSIS — I4901 Ventricular fibrillation: Secondary | ICD-10-CM | POA: Insufficient documentation

## 2021-01-15 DIAGNOSIS — I5022 Chronic systolic (congestive) heart failure: Secondary | ICD-10-CM

## 2021-01-15 DIAGNOSIS — I2510 Atherosclerotic heart disease of native coronary artery without angina pectoris: Secondary | ICD-10-CM | POA: Insufficient documentation

## 2021-01-15 DIAGNOSIS — Z8674 Personal history of sudden cardiac arrest: Secondary | ICD-10-CM | POA: Insufficient documentation

## 2021-01-15 DIAGNOSIS — E1122 Type 2 diabetes mellitus with diabetic chronic kidney disease: Secondary | ICD-10-CM | POA: Insufficient documentation

## 2021-01-15 DIAGNOSIS — I130 Hypertensive heart and chronic kidney disease with heart failure and stage 1 through stage 4 chronic kidney disease, or unspecified chronic kidney disease: Secondary | ICD-10-CM | POA: Insufficient documentation

## 2021-01-15 DIAGNOSIS — Z7984 Long term (current) use of oral hypoglycemic drugs: Secondary | ICD-10-CM | POA: Diagnosis not present

## 2021-01-15 DIAGNOSIS — Z9119 Patient's noncompliance with other medical treatment and regimen: Secondary | ICD-10-CM | POA: Diagnosis not present

## 2021-01-15 DIAGNOSIS — Z006 Encounter for examination for normal comparison and control in clinical research program: Secondary | ICD-10-CM

## 2021-01-15 DIAGNOSIS — Z955 Presence of coronary angioplasty implant and graft: Secondary | ICD-10-CM | POA: Insufficient documentation

## 2021-01-15 DIAGNOSIS — Z7901 Long term (current) use of anticoagulants: Secondary | ICD-10-CM | POA: Insufficient documentation

## 2021-01-15 DIAGNOSIS — Z9079 Acquired absence of other genital organ(s): Secondary | ICD-10-CM | POA: Diagnosis not present

## 2021-01-15 LAB — BASIC METABOLIC PANEL
Anion gap: 11 (ref 5–15)
BUN: 15 mg/dL (ref 8–23)
CO2: 26 mmol/L (ref 22–32)
Calcium: 9.4 mg/dL (ref 8.9–10.3)
Chloride: 104 mmol/L (ref 98–111)
Creatinine, Ser: 1.31 mg/dL — ABNORMAL HIGH (ref 0.61–1.24)
GFR, Estimated: 56 mL/min — ABNORMAL LOW (ref >60)
Glucose, Bld: 309 mg/dL — ABNORMAL HIGH (ref 70–99)
Potassium: 5.2 mmol/L — ABNORMAL HIGH (ref 3.5–5.1)
Sodium: 141 mmol/L (ref 135–145)

## 2021-01-15 MED ORDER — CARVEDILOL 25 MG PO TABS: 25.0000 mg | ORAL_TABLET | Freq: Two times a day (BID) | ORAL | 3 refills | Status: DC

## 2021-01-15 MED ORDER — ROSUVASTATIN CALCIUM 20 MG PO TABS: 20.0000 mg | ORAL_TABLET | Freq: Every day | ORAL | 3 refills | Status: DC

## 2021-01-15 MED ORDER — DAPAGLIFLOZIN PROPANEDIOL 10 MG PO TABS
10.0000 mg | ORAL_TABLET | Freq: Every day | ORAL | 11 refills | Status: DC
Start: 2021-01-15 — End: 2021-07-19

## 2021-01-15 NOTE — Patient Instructions (Addendum)
Labs done today. We will contact you only if your labs are abnormal.  START Farxiga 10mg  (1 tablet) by mouth daily.  INCREASE Carvedilol to 25mg  (1 tablet) by mouth 2 times daily.  INCREASE Crestor to 20mg  (1 tablet) by motuth daily.  No other medication changes were made. Please continue all current medications as prescribed.  Your physician recommends that you schedule a follow-up appointment in:  10 days for a lab only appointment,3 weeks for an appointment with our Clinic Pharmacist, Lauren and in 2 months with Dr. Aundra Dubin   If you have any questions or concerns before your next appointment please send [place] a message through [facility] or call our office at [phone].    TO LEAVE A MESSAGE FOR THE NURSE SELECT OPTION 2, PLEASE LEAVE A MESSAGE INCLUDING: . YOUR NAME . DATE OF BIRTH . CALL BACK NUMBER . REASON FOR CALL**this is important as we prioritize the call backs  YOU WILL RECEIVE A CALL BACK THE SAME DAY AS LONG AS YOU CALL BEFORE 4:00 PM   Do the following things EVERYDAY: 1) Weigh yourself in the morning before breakfast. Write it down and keep it in a log. 2) Take your medicines as prescribed 3) Eat low salt foods-Limit salt (sodium) to 2000 mg per day.  4) Stay as active as you can everyday 5) Limit all fluids for the day to less than 2 liters   At the [facility], you and your health needs are our priority. As part of our continuing mission to provide you with exceptional heart care, we have created designated Provider Care Teams. These Care Teams include your primary Cardiologist (physician) and Advanced Practice Providers (APPs- Physician Assistants and Nurse Practitioners) who all work together to provide you with the care you need, when you need it.   You may see any of the following providers on your designated Care Team at your next follow up: Marland Kitchen Dr Glori Bickers . Dr Loralie Champagne . Darrick Grinder, NP . Lyda Jester, PA . Audry Riles,  PharmD   Please be sure to bring in all your medications bottles to every appointment. '

## 2021-01-16 NOTE — Progress Notes (Signed)
Patient ID: Wayne [name], male   DOB: 1941-03-11, 80 y.o.   MRN: [id number]   Advanced Heart Failure Clinic Note   Patient ID: Wayne [name], male   DOB: Jun 01, 1941, 80 y.o.   MRN: [id number] PCP: Dr. Elease Hashimoto Cardiology: Dr. Earline Mayotte [name] is a 80 y.o. male  with history of DM, HTN, CAD, and ischemic cardiomyopathy who presents for followup of CHF and CAD. Patient had a CHF exacerbation in 2/12 and was found to have LV systolic dysfunction with EF around 20%. LHC showed RCA, ramus, and CFX disease. RCA was subtotally occluded. Cardiac MRI showed that all walls, including the inferior wall, should be viable. Patient therefore underwent opening of his chronic totally occluded RCA as well as PCI to the ramus in 2/12. He received drug eluting stents and was on Effient.  He had an echo in 2/13 that showed EF improved to 45% with moderate LV dilation and mild LV hypertrophy.   In 5/13, he developed a large left lower leg hematoma.  He was still on Effient at that time.  ASA and Effient were stopped.  The hematoma did not resolve.  In 7/13, he was re-admitted with septic shock from MSSA from an abscess in his left gastrocnemius.  He also grew Pseudomonas from the left gastrocnemius as well.  He had a prolonged course in the hospital and later in a rehab facility.  Ultimately, he got back home again.  I had him get an echo in 1/14, and this showed EF 15% with diffuse hypokinesis and inferoposterior akinesis.  He had been off of most of his prior cardiac medications.  Took him back for [facility] in 1/14.  This showed subtotal occlusion of a small AV LCx and 95% ostial in-stent restenosis in the RCA.  He was treated in 2/14 with a Xience DES to the ostial RCA and begun on Plavix.  Unfortunately, he developed diffuse hives after starting Plavix and had to be switched to ticlopidine.  He has tolerated ticlopidine.   Echo done in 12/14 showed some improvement in LV function but EF was still low at 30-35%.  He did not want  ICD.    Presented to ED 11/14/15 with worsening SOB after a few steps and CXR with CHF and bilateral effusions in the setting of marked medical non-compliance. States he stopped taking his medicines in 01/2015 (except for ASA 81 and a multivitamin).  He was feeling good and decided that he did not need them anymore.  Also c/o URI symptoms. Diuresed over 2 L with IV diuretics in the ER and started on lasix 20 mg daily.   Echo (12/16) showed EF 20-25% with diffuse hypokinesis (down from 35% in 12/15).  Echo 10/17 showed EF 15% with mildly decreased RV systolic function.    He had left and right heart catheterization in 12/17. This showed elevated right and left heart filling pressures and low cardiac output. The RCA was totally occluded with collaterals, there was 95% ostial to mid LAD stenosis.  Patient had PCI with DES to proximal-mid LAD.    He was noted to be in atrial fibrillation with RVR in 2/18 at cardiac rehab. He felt fatigued.  He was admitted and started on amiodarone for rate control. ASA was stopped, ticagrelor was decreased to 60 mg bid, and Xarelto 15 mg daily was started.  He converted spontaneously back to NSR.    He suffered a ventricular fibrillation arrest in 3/18.  Luckily, he was wearing  a Lifevest and was shocked.  He was admitted and started on amiodarone.  Medtronic ICD was placed.  Echo 3/18 with EF 20-25%.   Admitted again 6/18 due to VF arrest. ICD lead had been undersensing leading to prolongation of time to discharge.  He underwent RV lead extraction with new lead implant.   He was admitted again with VT storm in 7/18. He had coronary angiography this admission.  Had cutting balloon angioplasty of severe in-stent restenosis in the proximal LAD.  He also had VT ablation in 7/18.   He had TURBT 11/18, urothelial cancer diagnosed.  TURBT again in 6/21.   Echo in 8/21 showed EF 25%, diffuse hypokinesis, moderate LV dilation, normal RV, IVC normal.   He returns today for  followup of CHF.  Main complaint is still low back pain with sciatica and left foot drop.  His left knee and ankle are also painful.  He uses a walker due to back and knee pain.  No dyspnea walking into the office today.  No orthopnea/PND.  No chest pain.  Overall, he is not very active.  No claudication.  No palpitations.  Weight is down 5 lbs.   ECG (personally reviewed): NSR, LBBB 156 msec  Labs (12/16): K 4.9, creatinine 1.23 Labs (1/17): K 4.7, creatinine 1.15, BNP 1433 Labs (2/17): K 4.7, creatinine 1.14, BNP 870 Labs (3/17): K 4.5, creatinine 1.10, BNP 1257 Labs (5/17): K 4.2, creatinine 1.2, BNP 818 Labs (7/17): LDL 149, HDL 40, TGs 210 Labs (9/17): K 4.9, creatinine 1.36 Labs (10/17): K 3.7, creatinine 1.2, LDL 87, HDL 32, LFTs normal Labs (12/17): K 4.5, creatinine 1.34, digoxin 0.3 Labs (2/18): K 4.4, creatinine 1.48, hgb 11.8 Labs (3/18): K 5 => 4.6, creatinine 1.54 => 1.46, digoxin 1.3 => 0.3 Labs (5/18): TSH normal Labs (7/18): K 3.7, creatinine 1.33, hgb 11.4, digoxin 0.6, LFTs normal Labs (8/18): K 5.1, creatinine 1.47, hgb 13.3 Labs (10/18): LDL 50, HDL 40, TGs 247 Labs (11/18): K 5.9, creatinine 1.42, hgb 15.2 Labs (12/18): K 5, creatinine 1.27, digoxin 0.6, LFTs normal, TSH normal Labs (3/19): LDL 86, HDL 45 Labs (6/19): K 4.1, creatinine 1.34, LFTs normal Labs (6/21): K 5, creatinine 1.3 Labs (8/21): K 5.1, creatinine 1.47, LDL 111, HDL 37  Past Medical History:  1. HYPERTENSION  2. HYPERLIPIDEMIA: Myalgias with atorvastatin and Crestor 3. ECZEMA  4. RHINITIS  5. HERPES ZOSTER OPHTHALMICUS  6. ADENOCARCINOMA, PROSTATE: Status post prostatectomy in 2009. Has had some incontinence since then.  7. Diabetes mellitus type II  8. Arthritis  9. Obesity  10. GERD: rare  11. CAD: Presented with exertional dyspnea, never had chest pain. LHC (2/12) with subtotalled proximal RCA and left to right collaterals, 90% proximal moderate-sized ramus, 95% proximal relatively  small CFX, 40-50% proximal LAD. Cardiac MRI (2/12) showed EF 21%, some mild scar in basal segments but all wall segments would be expected to be viable. DES x 5 (overlapping) to RCA, DES x 1 to RI 01/30/11 .  Bled into leg with Effient use (long, complicated course).  LHC (1/14): AV LCx small with subtotal occlusion, 95% ostial instent restenosis in RCA. PCI in 2/14 to ostial RCA with 3.5 x 15 Xience DES.  Plavix allergy (hives) so put on ticlopidine.  - LHC (12/17):  the RCA was totally occluded with collaterals, there was 95% ostial to mid LAD stenosis, patient had PCI with DES to proximal-mid LAD. - LHC (7/18): Totally occluded RCA with left to right collaterals, subtotal occlusion  of small AV LCx, 90% ISR proximal LAD => cutting balloon angioplasty.  12. Ischemic CMP: Echo (2/12) with moderately dilated LV, EF about 20% with diffuse hypokinesis and inferior akinesis, pseudonormal diastolic function, mild MR, severe LAE, mildly decreased RV systolic function. RHC (2/12) with mean RA 12, PA 40/25, mean PCWP 26, CI 2.1.  Echo (5/12) with EF 40% (appeared worse to my eye) with posterior HK, basal inferior AK, inferoseptal AK, basal anteroseptal AK, mild MR.  Cardiac MRI was repeated and showed EF 32% (improved from 21%) and mild LV dilation (was severely dilated before) with diffuse hypokinesis and subendocardial scar in the basal inferior, basal posterior, and basal anterolateral segments.  Echo (2/13) with EF 45%, moderate LV dilation, mild LVH.  Echo (1/14) with EF 15%, diffuse hypokinesis, inferoposterior akinesis, mild MR, grade I diastolic dysfunction.  Echo (5/14) with EF 30-35%, mild LV dilation, akinesis of the basal inferior wall otherwise diffuse hypokinesis.  Echo (12/14) with EF 30-35%, mild LV dilation, diffuse hypokinesis with basal inferior and posterior akinesis. Echo (12/15) with EF 35%, mildly dilated LV, wall motion abnormalities, normal RV size and systolic function. Echo (12/16) with EF  20-25%, diffuse hypokinesis, severe LV dilation, mild MR.  - Echo (10/17): EF 15%, grade II diastolic dysfunction, mild MR, mildly decreased RV systolic function.  - Possible angioedema related to Entresto.  - Hyperkalemia with spironolactone 12.5 daily.  - CPX (10/17): peak VO2 10.6, VE/VCO2 slope 48.6, RER 1.12.  Severe HF limitation.  - RHC (12/17): mean RA 14, PA 53/27, mean PCWP 25, CI 1.93.  - Echo (3/18): EF 20-25%, moderate LV dilation, mild LVH, normal RV size and systolic function, mild aortic stenosis.  - Medtronic ICD 3/18.  - Echo (6/18): EF 25-30%, mild AS.  - Echo (7/19): Moderate LV dilation, EF 25-30%, diffuse hypokinesis with inferior AK, normal RV size and systolic function, mild AS.  - Echo (8/21): EF 25%, diffuse hypokinesis with moderate dilation, normal RV, IVC normal.  - CPX (8/21): peak VO2 10.2, VE/VCO2 slope 33, RER 1.0 => submaximal due to orthopedic issues, possibly mild HF limitation.  13. Cervical OA.  14. Polymyalgia rheumatica 15. OSA: Severe on sleep study 10/12.  On CPAP.  16. Left lower leg hematoma in setting of Effient use.  He developed a left gastrocnemius abscess and septic shock with prolonged hospitalization beginning in 7/13.  17. PAD: Lower extremity arterial doppler evaluation in 2/17 showed occluded peroneal arteries bilaterally, ABI 1.1 (R) and 1.0 (L).   - ABIs normal in 1/19.  18. Carotid dopplers (10/17) with minimal disease.  19. Atrial fibrillation: Paroxysmal.  20. Ventricular fibrillation arrest: 3/18.  Medtronic ICD placed, now on amiodarone.  - VT storm 7/18, now s/p VT ablation.  21. Aortic stenosis: Mild on 6/18 echo and 7/19 echo.  22. Urothelial cancer: s/p TURBT of bladder tumor 11/18 and 6/21.    Family History:  Father died with MI at age 17. He was an alcoholic. Mother died with cancer at around 2.   Social History:  Occupation: retired Administrator  Married, lives in [place]  Past smoker, quit around [place]  of systems complete and found to be negative unless listed in HPI.      Current Outpatient Medications  Medication Sig Dispense Refill  . amiodarone (PACERONE) 200 MG tablet One tablet daily from Monday through Saturday.Do not take on Sunday 72 tablet 0  . apixaban (ELIQUIS) 5 MG TABS tablet Take 1 tablet (5 mg total)  by mouth 2 (two) times daily. 120 tablet 0  . dapagliflozin propanediol (FARXIGA) 10 MG TABS tablet Take 1 tablet (10 mg total) by mouth daily before breakfast. 30 tablet 11  . diphenhydramine-acetaminophen (TYLENOL PM) 25-500 MG TABS tablet Take 1 tablet at bedtime as needed by mouth (for sleep).     . furosemide (LASIX) 20 MG tablet TAKE 1 TABLET(20 MG) BY MOUTH TWICE DAILY 180 tablet 3  . glucose blood (ACCU-CHEK AVIVA PLUS) test strip USE TO CHECK BLOOD SUGAR ONCE TO TWICE DAILY 200 strip 1  . losartan (COZAAR) 25 MG tablet TAKE 1 TABLET BY MOUTH TWICE DAILY 180 tablet 2  . Multiple Vitamin (MULTIVITAMIN WITH MINERALS) TABS tablet Take 1 tablet by mouth daily. Centrum Silver Adult 74+    . Omega-3 Fatty Acids (FISH OIL) 1000 MG CAPS Take 1,000 mg by mouth 2 (two) times daily.     Marland Kitchen spironolactone (ALDACTONE) 25 MG tablet Take 0.5 tablets (12.5 mg total) by mouth daily. 30 tablet 6  . tamsulosin (FLOMAX) 0.4 MG CAPS capsule Take 0.4 mg by mouth every other day. At bedtime    . TRUEplus Lancets 30G MISC USE TO CHECK BLOOD SUGAR EVERY DAY TO TWICE DAILY 100 each 1  . carvedilol (COREG) 25 MG tablet Take 1 tablet (25 mg total) by mouth 2 (two) times daily with a meal. 90 tablet 3  . rosuvastatin (CRESTOR) 20 MG tablet Take 1 tablet (20 mg total) by mouth daily. 90 tablet 3   No current facility-administered medications for this encounter.    Vitals:   01/15/21 1051  BP: 140/80  Pulse: 66  SpO2: 95%  Weight: 106.9 kg (235 lb 9.6 oz)   Wt Readings from Last 3 Encounters:  01/15/21 106.9 kg (235 lb 9.6 oz)  07/19/20 108.9 kg (240 lb)  07/19/20 108.9 kg (240 lb)   General:  NAD Neck: No JVD, no thyromegaly or thyroid nodule.  Lungs: Clear to auscultation bilaterally with normal respiratory effort. CV: Nondisplaced PMI.  Heart regular S1/S2, no S3/S4, no murmur.  No peripheral edema.  No carotid bruit.  Difficult to palpate pedal pulses.  Abdomen: Soft, nontender, no hepatosplenomegaly, no distention.  Skin: Intact without lesions or rashes.  Neurologic: Alert and oriented x 3.  Psych: Normal affect. Extremities: No clubbing or cyanosis.  HEENT: Normal.   Assessment/Plan:  1. Chronic systolic CHF: Ischemic cardiomyopathy.  6/18 echo with EF 25-30%, diffuse hypokinesis.  CPX in 10/17 with severe HF limitation.  RHC in 12/17 with elevated filling pressures and low cardiac output.  Medtronic ICD in 3/18 after vfib arrest. He had VT again in 6/18, then VT storm in 7/18 now s/p VT ablation. Echo in 8/21 with EF 25% with normal RV (stable). CPX 8/21 was submaximal due to orthopedic issues, possible mild HF limitation.  NYHA class II symptoms, more limited by orthopedic pain than dyspnea. He is not volume overloaded on exam.  - Continue lasix 20 mg BID.  - Increase Coreg 25 mg bid.  - Continue spironolactone 12.5 mg daily, will not increase with upper normal K.  - Continue losartan 25 mg bid. He had angioedema with Entresto.   - Add Farxiga 10 mg daily.  BMET today and in 10 days.  - LBBB on ECG with QRS 156 msec.  Have discussed with Dr. Lovena Le in the past and he has recommended to hold off CRT upgrade unless the patient has clear dyssynchrony on echo. - He may be an LVAD candidate in  the future.   - Reinforced fluid restriction to < 2 L daily, sodium restriction to less than 2000 mg daily, and the importance of daily weights.   2. CAD:  S/p cutting balloon angioplasty to in-stent restenosis in proximal LAD in 7/18.  He has an occluded RCA but there were left to right collaterals.  No chest pain.  - He had been on Repatha but stopped because insurance was not covering.     - He is tolerating Crestor 10, will increase to 20 mg daily with elevated LDL when checked most recently.  Lipid/LFTs in 2 months.  - He is not on ASA given Xarelto use.  3. HLD: See discussion above.  4. PAD: Normal ABIs in 1/19. No claudication.  5. Diabetes type II: As above, start Farxiga 10 mg daily.  6. Atrial fibrillation: NSR today on amiodarone 200 mg daily.  - Continue Xarelto 20 mg daily. CBC today.  - Continue amiodarone 200 mg daily. Check LFTs/TSH.  He will need regular eye exams.  - Would consider atrial fibrillation ablation based on CASTLE-HF data if AF recurs. 7. Ventricular fibrillation arrest: On amiodarone and has Medtronic ICD.  He had VT ablation in 7/18 after episode of VT storm.  - Continue amiodarone.  8. OSA:  Not using CPAP, cannot tolerate full face mask.  He has severe OSA.   9. CKD: Stage 3.  - BMET today.  - Add Farxiga 10 mg daily.   Followup 3 wks in pharmacy clinic for med titration, see me in 2 months.   Loralie Champagne 01/16/2021

## 2021-01-25 ENCOUNTER — Other Ambulatory Visit: Payer: Self-pay

## 2021-01-25 ENCOUNTER — Ambulatory Visit (HOSPITAL_COMMUNITY)
Admission: RE | Admit: 2021-01-25 | Discharge: 2021-01-25 | Disposition: A | Discharge status: Home / Self Care | Payer: Medicare Other | Source: Ambulatory Visit | Attending: Cardiology | Admitting: Cardiology

## 2021-01-25 DIAGNOSIS — I5022 Chronic systolic (congestive) heart failure: Secondary | ICD-10-CM | POA: Diagnosis not present

## 2021-01-25 LAB — BASIC METABOLIC PANEL
Anion gap: 12 (ref 5–15)
BUN: 17 mg/dL (ref 8–23)
CO2: 24 mmol/L (ref 22–32)
Calcium: 9.2 mg/dL (ref 8.9–10.3)
Chloride: 105 mmol/L (ref 98–111)
Creatinine, Ser: 1.35 mg/dL — ABNORMAL HIGH (ref 0.61–1.24)
GFR, Estimated: 54 mL/min — ABNORMAL LOW (ref >60)
Glucose, Bld: 240 mg/dL — ABNORMAL HIGH (ref 70–99)
Potassium: 4.8 mmol/L (ref 3.5–5.1)
Sodium: 141 mmol/L (ref 135–145)

## 2021-02-06 NOTE — Progress Notes (Incomplete)
***In Progress***  PCP: Dr. Elease Hashimoto Cardiology: Dr. Aundra Dubin  HPI:   Wayne [name] is a 80 y.o. male  with history of DM, HTN, CAD, and ischemic cardiomyopathy who presents for followup of CHF and CAD. Patient had a CHF exacerbation in 01/2011 and was found to have LV systolic dysfunction with EF around 20%. LHC showed RCA, ramus, and CFX disease. RCA was subtotally occluded. Cardiac MRI showed that all walls, including the inferior wall, should be viable. Patient therefore underwent opening of his chronic totally occluded RCA as well as PCI to the ramus in 01/2011. He received drug eluting stents and was on Effient.  He had an echo in 01/2012 that showed EF improved to 45% with moderate LV dilation and mild LV hypertrophy.   In 04/2012, he developed a large left lower leg hematoma.  He was still on Effient at that time.  ASA and Effient were stopped.  The hematoma did not resolve.  In 06/2012, he was re-admitted with septic shock from MSSA from an abscess in his left gastrocnemius.  He also grew Pseudomonas from the left gastrocnemius as well.  He had a prolonged course in the hospital and later in a rehab facility.  Ultimately, he got back home again.  I had him get an echo in 12/2012, and this showed EF 15% with diffuse hypokinesis and inferoposterior akinesis.  He had been off of most of his prior cardiac medications.  Took him back for [facility] in 12/2012.  This showed subtotal occlusion of a small AV LCx and 95% ostial in-stent restenosis in the RCA.  He was treated in 01/2013 with a Xience DES to the ostial RCA and begun on Plavix.  Unfortunately, he developed diffuse hives after starting Plavix and had to be switched to ticlopidine.  He has tolerated ticlopidine.   Echo done in 11/2013 showed some improvement in LV function but EF was still low at 30-35%.  He did not want ICD.    Presented to ED 11/14/15 with worsening SOB after a few steps and CXR with CHF and bilateral effusions in the setting of marked  medical non-compliance. States he stopped taking his medicines in 01/2015 (except for ASA 81 and a multivitamin).  He was feeling good and decided that he did not need them anymore.  Also c/o URI symptoms. Diuresed over 2 L with IV diuretics in the ER and started on furosemide 20 mg daily.   Echo (11/2015) showed EF 20-25% with diffuse hypokinesis (down from 35% in 11/2014).  Echo 09/2016 showed EF 15% with mildly decreased RV systolic function.    He had left and right heart catheterization in 11/2016. This showed elevated right and left heart filling pressures and low cardiac output. The RCA was totally occluded with collaterals, there was 95% ostial to mid LAD stenosis.  Patient had PCI with DES to proximal-mid LAD.    He was noted to be in atrial fibrillation with RVR in 01/2017 at cardiac rehab. He felt fatigued.  He was admitted and started on amiodarone for rate control. ASA was stopped, ticagrelor was decreased to 60 mg bid, and Xarelto 15 mg daily was started.  He converted spontaneously back to NSR.    He suffered a ventricular fibrillation arrest in 02/2017.  Luckily, he was wearing a Lifevest and was shocked.  He was admitted and started on amiodarone.  Medtronic ICD was placed.  Echo 02/2017 with EF 20-25%.   Admitted again 05/2017 due to VF arrest. ICD lead  had been undersensing leading to prolongation of time to discharge.  He underwent RV lead extraction with new lead implant.   He was admitted again with VT storm in 06/2017. He had coronary angiography this admission.  Had cutting balloon angioplasty of severe in-stent restenosis in the proximal LAD.  He also had VT ablation in 06/2017.   He had TURBT 10/2017, urothelial cancer diagnosed.  TURBT again in 05/2020.   Echo in 07/2020 showed EF 25%, diffuse hypokinesis, moderate LV dilation, normal RV, IVC normal.   He returned for followup of CHF with Dr. Aundra Dubin on 01/15/21.  Main complaint was still low back pain with sciatica and left  foot drop.  His left knee and ankle were also painful.  He used a walker due to back and knee pain.  No dyspnea walking into the office.  No orthopnea/PND.  No chest pain.  Overall, he was not very active.  No claudication.  No palpitations.  Weight was down 5 lbs.   Today he returns to HF clinic for pharmacist medication titration. At last visit with MD, carvedilol was increased to 25 mg BID, Farxiga 10 mg daily was started, and rosuvastatin was increased to 20 mg daily due to LDL of 111 on 07/19/20.    Overall feeling ***. Dizziness, lightheadedness, fatigue:  Chest pain or palpitations:  How is your breathing?: *** SOB: Able to complete all ADLs. Activity level ***  Weight at home pounds. Takes furosemide/torsemide/bumex *** mg *** daily.  LEE PND/Orthopnea  Appetite *** Low-salt diet:   Physical Exam Cost/affordability of meds   HF Medications: Carvedilol 25 mg BID Losartan 25 mg BID Spironolactone 12.5 mg daily Farxiga 10 mg daily Furosemide 20 mg BID  Has the patient been experiencing any side effects to the medications prescribed?  {YES NO:22349}  Does the patient have any problems obtaining medications due to transportation or finances?   {YES NO:22349}  Understanding of regimen: {excellent/good/fair/poor:19665} Understanding of indications: {excellent/good/fair/poor:19665} Potential of compliance: {excellent/good/fair/poor:19665} Patient understands to avoid NSAIDs. Patient understands to avoid decongestants.    Pertinent Lab Values: . Serum creatinine 1.35, BUN 17, Potassium 4.8, Sodium 141   Vital Signs: . Weight: *** (last clinic weight: 235.6 lbs) . Blood pressure: ***  . Heart rate: ***   Assessment/Plan:  1. Chronic systolic CHF: Ischemic cardiomyopathy.  6/18 echo with EF 25-30%, diffuse hypokinesis.  CPX in 10/17 with severe HF limitation.  RHC in 12/17 with elevated filling pressures and low cardiac output.  Medtronic ICD in 3/18 after vfib arrest.  He had VT again in 6/18, then VT storm in 7/18 now s/p VT ablation. Echo in 8/21 with EF 25% with normal RV (stable). CPX 8/21 was submaximal due to orthopedic issues, possible mild HF limitation.   - NYHA class II symptoms, more limited by orthopedic pain than dyspnea. Euvolemic upon exam. - Continue furosemide 20 mg BID.  - Continuee carvedilol 25 mg BID. - Continue losartan 25 mg BID. He had angioedema with Entresto.  - Continue spironolactone 12.5 mg daily, will not increase with upper normal K.    - Continue Farxiga 10 mg daily. - Follow up with pharmacy HF clinic on ***  2. CAD:  S/p cutting balloon angioplasty to in-stent restenosis in proximal LAD in 06/2017.  He has an occluded RCA but there were left to right collaterals.  No chest pain.  - He had been on Repatha but stopped because insurance was not covering.    - Currently taking rosuvastatin 20  mg daily. - He is not on ASA given Xarelto use.   3. HLD: - LDL 111 on 07/19/20. - Continue rosuvastatin 20 mg daily.  4. PAD: Normal ABIs in 12/2017. No claudication.   5. Diabetes type II: - Continue Farxiga 10 mg daily.   6. Atrial fibrillation: NSR on 01/15/21 - Continue Xarelto 20 mg daily. CBC today.  - Continue amiodarone 200 mg daily. Check LFTs/TSH.  He will need regular eye exams.  - Would consider atrial fibrillation ablation based on [facility]STLE-HF data if AF recurs.  7. Ventricular fibrillation arrest: On amiodarone and has Medtronic ICD.  He had VT ablation in 06/2017 after episode of VT storm.  - Continue amiodarone.   8. OSA:  Not using CPAP, cannot tolerate full face mask.  He has severe OSA.    9. CKD: Stage 3.   - Continue Farxiga 10 mg daily.   Audry Riles, PharmD, BCPS, BCCP, CPP Heart Failure Clinic Pharmacist [phone]

## 2021-02-07 ENCOUNTER — Ambulatory Visit (HOSPITAL_COMMUNITY)
Admission: RE | Admit: 2021-02-07 | Discharge: 2021-02-07 | Disposition: A | Discharge status: Home / Self Care | Payer: Medicare Other | Source: Ambulatory Visit | Attending: Cardiology | Admitting: Cardiology

## 2021-02-07 ENCOUNTER — Other Ambulatory Visit: Payer: Self-pay

## 2021-02-07 ENCOUNTER — Telehealth (HOSPITAL_COMMUNITY): Payer: Self-pay | Admitting: Pharmacist

## 2021-02-07 VITALS — BP 128/62 | HR 60 | Wt 233.6 lb

## 2021-02-07 DIAGNOSIS — E785 Hyperlipidemia, unspecified: Secondary | ICD-10-CM | POA: Diagnosis not present

## 2021-02-07 DIAGNOSIS — Z79899 Other long term (current) drug therapy: Secondary | ICD-10-CM | POA: Diagnosis not present

## 2021-02-07 DIAGNOSIS — E1122 Type 2 diabetes mellitus with diabetic chronic kidney disease: Secondary | ICD-10-CM | POA: Insufficient documentation

## 2021-02-07 DIAGNOSIS — I5022 Chronic systolic (congestive) heart failure: Secondary | ICD-10-CM | POA: Diagnosis not present

## 2021-02-07 DIAGNOSIS — Z7984 Long term (current) use of oral hypoglycemic drugs: Secondary | ICD-10-CM | POA: Diagnosis not present

## 2021-02-07 DIAGNOSIS — I4901 Ventricular fibrillation: Secondary | ICD-10-CM | POA: Diagnosis not present

## 2021-02-07 DIAGNOSIS — G4733 Obstructive sleep apnea (adult) (pediatric): Secondary | ICD-10-CM | POA: Insufficient documentation

## 2021-02-07 DIAGNOSIS — Z7901 Long term (current) use of anticoagulants: Secondary | ICD-10-CM | POA: Diagnosis not present

## 2021-02-07 DIAGNOSIS — I2510 Atherosclerotic heart disease of native coronary artery without angina pectoris: Secondary | ICD-10-CM | POA: Insufficient documentation

## 2021-02-07 DIAGNOSIS — I130 Hypertensive heart and chronic kidney disease with heart failure and stage 1 through stage 4 chronic kidney disease, or unspecified chronic kidney disease: Secondary | ICD-10-CM | POA: Insufficient documentation

## 2021-02-07 DIAGNOSIS — I255 Ischemic cardiomyopathy: Secondary | ICD-10-CM | POA: Insufficient documentation

## 2021-02-07 DIAGNOSIS — N183 Chronic kidney disease, stage 3 unspecified: Secondary | ICD-10-CM | POA: Diagnosis not present

## 2021-02-07 MED ORDER — LOSARTAN POTASSIUM 25 MG PO TABS: ORAL_TABLET | ORAL | 3 refills | Status: DC

## 2021-02-07 NOTE — Patient Instructions (Signed)
It was a pleasure seeing you today!  MEDICATIONS: -We are changing your medications today -Increase losartan to 25 mg (1 tablet) every morning and 50 mg (2 tablets) every evening.  -Call if you have questions about your medications.   NEXT APPOINTMENT: Return to clinic in 2 months with Dr. Aundra Dubin.  In general, to take care of your heart failure: -Limit your fluid intake to 2 Liters (half-gallon) per day.   -Limit your salt intake to ideally 2-3 grams (2000-3000 mg) per day. -Weigh yourself daily and record, and bring that "weight diary" to your next appointment.  (Weight gain of 2-3 pounds in 1 day typically means fluid weight.) -The medications for your heart are to help your heart and help you live longer.   -Please contact us before stopping any of your heart medications.  Call the clinic at [phone] with questions or to reschedule future appointments.

## 2021-02-07 NOTE — Progress Notes (Signed)
PCP: Dr. Elease Hashimoto Cardiology: Dr. Aundra Dubin  HPI:   Wayne [name] is a 80 y.o. male  with history of DM, HTN, CAD, and ischemic cardiomyopathy who presents for followup of CHF and CAD. Patient had a CHF exacerbation in 01/2011 and was found to have LV systolic dysfunction with EF around 20%. LHC showed RCA, ramus, and CFX disease. RCA was subtotally occluded. Cardiac MRI showed that all walls, including the inferior wall, should be viable. Patient therefore underwent opening of his chronic totally occluded RCA as well as PCI to the ramus in 01/2011. He received drug eluting stents and was on Effient.  He had an echo in 01/2012 that showed EF improved to 45% with moderate LV dilation and mild LV hypertrophy.   In 04/2012, he developed a large left lower leg hematoma.  He was still on Effient at that time.  ASA and Effient were stopped.  The hematoma did not resolve.  In 06/2012, he was re-admitted with septic shock from MSSA from an abscess in his left gastrocnemius.  He also grew Pseudomonas from the left gastrocnemius as well.  He had a prolonged course in the hospital and later in a rehab facility.  Ultimately, he got back home again.  I had him get an echo in 12/2012, and this showed EF 15% with diffuse hypokinesis and inferoposterior akinesis.  He had been off of most of his prior cardiac medications.  Took him back for [facility] in 12/2012.  This showed subtotal occlusion of a small AV LCx and 95% ostial in-stent restenosis in the RCA.  He was treated in 01/2013 with a Xience DES to the ostial RCA and begun on Plavix.  Unfortunately, he developed diffuse hives after starting Plavix and had to be switched to ticlopidine.  He has tolerated ticlopidine.   Echo done in 11/2013 showed some improvement in LV function but EF was still low at 30-35%.  He did not want ICD.    Presented to ED 11/14/15 with worsening SOB after a few steps and CXR with CHF and bilateral effusions in the setting of marked medical non-compliance.  States he stopped taking his medicines in 01/2015 (except for ASA 81 and a multivitamin).  He was feeling good and decided that he did not need them anymore.  Also c/o URI symptoms. Diuresed over 2 L with IV diuretics in the ER and started on furosemide 20 mg daily.   Echo (11/2015) showed EF 20-25% with diffuse hypokinesis (down from 35% in 11/2014).  Echo 09/2016 showed EF 15% with mildly decreased RV systolic function.    He had left and right heart catheterization in 11/2016. This showed elevated right and left heart filling pressures and low cardiac output. The RCA was totally occluded with collaterals, there was 95% ostial to mid LAD stenosis.  Patient had PCI with DES to proximal-mid LAD.    He was noted to be in atrial fibrillation with RVR in 01/2017 at cardiac rehab. He felt fatigued.  He was admitted and started on amiodarone for rate control. ASA was stopped, ticagrelor was decreased to 60 mg bid, and Xarelto 15 mg daily was started.  He converted spontaneously back to NSR.    He suffered a ventricular fibrillation arrest in 02/2017.  Luckily, he was wearing a Lifevest and was shocked.  He was admitted and started on amiodarone.  Medtronic ICD was placed.  Echo 02/2017 with EF 20-25%.   Admitted again 05/2017 due to VF arrest. ICD lead had been undersensing  leading to prolongation of time to discharge.  He underwent RV lead extraction with new lead implant.   He was admitted again with VT storm in 06/2017. He had coronary angiography this admission.  Had cutting balloon angioplasty of severe in-stent restenosis in the proximal LAD.  He also had VT ablation in 06/2017.   He had TURBT 10/2017, urothelial cancer diagnosed.  TURBT again in 05/2020.   Echo in 07/2020 showed EF 25%, diffuse hypokinesis, moderate LV dilation, normal RV, IVC normal.   He returned for followup of CHF with Dr. Aundra Dubin on 01/15/21.  Main complaint was still low back pain with sciatica and left foot drop.  His left  knee and ankle were also painful.  He used a walker due to back and knee pain.  No dyspnea walking into the office.  No orthopnea/PND.  No chest pain.  Overall, he was not very active.  No claudication.  No palpitations.  Weight was down 5 lbs.   Today he returns to HF clinic for pharmacist medication titration. At last visit with MD, carvedilol was increased to 25 mg BID, Farxiga 10 mg daily was started, and rosuvastatin was increased to 20 mg daily due to LDL of 111 on 07/19/20. Overall feeling good today. He is using a walker today. No dizziness or lightheadedness. He only feels fatigued after being active outside, but he feels he can keep going if he pushes himself. No chest pain or palpitations. Overall, his breathing is good. No SOB. Weight at home is stable around 240 lbs. He takes furosemide 20 mg BID and has not needed any extra. No LEE, PND or orthopnea. His appetite is good. Patient has noticed increased urination since last visit with Dr. Aundra Dubin on 01/15/21 likely due to the [place], but otherwise tolerating all medications well.   HF Medications: Carvedilol 25 mg BID Losartan 25 mg BID Spironolactone 12.5 mg daily Farxiga 10 mg daily Furosemide 20 mg BID  Has the patient been experiencing any side effects to the medications prescribed?  No  Does the patient have any problems obtaining medications due to transportation or finances?   Yes, Eliquis and Wilder Glade are $47 per month. While he states he can afford this, it is a strain on their finances since his wife is also on some more expensive brand medications. Will apply for Farxiga patient assistance today.  Understanding of regimen: good Understanding of indications: good Potential of compliance: good Patient understands to avoid NSAIDs. Patient understands to avoid decongestants.    Pertinent Lab Values: (01/25/21) . Serum creatinine 1.35, BUN 17, Potassium 4.8, Sodium 141   Vital Signs: . Weight: 233.6 (last clinic weight: 235.6  lbs) . Blood pressure: 128/62  . Heart rate: 60  Assessment/Plan:  1. Chronic systolic CHF: Ischemic cardiomyopathy.  05/2017 echo with EF 25-30%, diffuse hypokinesis.  CPX in 09/2016 with severe HF limitation.  RHC in 11/2016 with elevated filling pressures and low cardiac output.  Medtronic ICD in 02/2017 after vfib arrest. He had VT again in 05/2017, then VT storm in 06/2017 now s/p VT ablation. Echo in 07/2020 with EF 25% with normal RV (stable). CPX 07/2020 was submaximal due to orthopedic issues, possible mild HF limitation.   - NYHA class II symptoms, more limited by orthopedic pain than dyspnea. Euvolemic on exam. - Continue furosemide 20 mg BID.  - Continue carvedilol 25 mg BID. - Increase losartan to 25 mg every morning and 50 mg every evening. He had angioedema with Entresto.  - Continue  spironolactone 12.5 mg daily, will not increase with upper normal K.    - Continue Farxiga 10 mg daily. Patient assistance pending. - Repeat BMET on 02/21/21 - Follow up with Dr. Aundra Dubin on 04/08/21.    2. CAD:  S/p cutting balloon angioplasty to in-stent restenosis in proximal LAD in 06/2017.  He has an occluded RCA but there were left to right collaterals.  No chest pain.  - He had been on Repatha but stopped because insurance was not covering.    - Currently taking rosuvastatin 20 mg daily. - He is not on ASA given Eliquis use.   3. HLD: - LDL 111 on 07/19/20. - Continue rosuvastatin 20 mg daily.  4. PAD: Normal ABIs in 12/2017. No claudication.   5. Diabetes type II: - Continue Farxiga 10 mg daily.   6. Atrial fibrillation: NSR on 01/15/21 - Continue Eliquis 5 mg BID.  - Continue amiodarone 200 mg daily except on Sunday.  7. Ventricular fibrillation arrest: On amiodarone and has Medtronic ICD.  He had VT ablation in 06/2017 after episode of VT storm.  - Continue amiodarone.   8. OSA:  Not using CPAP, cannot tolerate full face mask.  He has severe OSA.    9. CKD: Stage 3.   - Continue Farxiga  10 mg daily.   Audry Riles, PharmD, BCPS, BCCP, CPP Heart Failure Clinic Pharmacist [phone]

## 2021-02-07 NOTE — Telephone Encounter (Signed)
Sent in Manufacturer's Assistance application to [employer] for Farxiga.    Application pending, will continue to follow.   Lauren Kemp, PharmD, BCPS, BCCP, CPP Heart Failure Clinic Pharmacist [phone]  

## 2021-02-21 ENCOUNTER — Other Ambulatory Visit (HOSPITAL_COMMUNITY): Payer: Medicare Other

## 2021-02-21 DIAGNOSIS — N35012 Post-traumatic membranous urethral stricture: Secondary | ICD-10-CM | POA: Diagnosis not present

## 2021-02-26 ENCOUNTER — Ambulatory Visit: Payer: Medicare Other

## 2021-02-27 NOTE — Telephone Encounter (Signed)
Advanced Heart Failure Patient Advocate Encounter   Patient was approved to receive Farxiga from [employer]  Patient ID: [id number] Effective dates: 02/19/21 through 12/14/21  Called and spoke with the patient.  Charlann Boxer, CPhT

## 2021-03-06 DIAGNOSIS — N35012 Post-traumatic membranous urethral stricture: Secondary | ICD-10-CM | POA: Diagnosis not present

## 2021-03-13 ENCOUNTER — Telehealth (HOSPITAL_COMMUNITY): Payer: Self-pay | Admitting: *Deleted

## 2021-03-13 MED ORDER — FUROSEMIDE 20 MG PO TABS: 20.0000 mg | ORAL_TABLET | Freq: Every day | ORAL | 3 refills | Status: DC

## 2021-03-13 NOTE — Telephone Encounter (Signed)
Left vm for pt to return my call.  

## 2021-03-13 NOTE — Telephone Encounter (Signed)
Pt aware annd voiced understanding

## 2021-03-13 NOTE — Telephone Encounter (Signed)
Received a message from [employer]  "spoke with this patient and he thinks that the Wilder Glade is making him dizzy. Says it doesn't last long. he says if he leans his head back a little bit its throwing off his balance and making him dizzy. says that he cant walk without a walker or cane now that he has started taking the farxiga. None of this was an issue before starting farxiga"    Routed to [place] for advice

## 2021-03-13 NOTE — Telephone Encounter (Signed)
He may be a little dehydrated.  Tell him to cut back Lasix from 20 mg bid to 20 mg once daily.

## 2021-03-14 ENCOUNTER — Other Ambulatory Visit: Payer: Self-pay

## 2021-03-14 ENCOUNTER — Ambulatory Visit (INDEPENDENT_AMBULATORY_CARE_PROVIDER_SITE_OTHER): Payer: Medicare Other

## 2021-03-14 DIAGNOSIS — I255 Ischemic cardiomyopathy: Secondary | ICD-10-CM

## 2021-03-14 LAB — CUP PACEART REMOTE DEVICE CHECK
Battery Remaining Longevity: 70 mo
Battery Voltage: 2.99 V
Brady Statistic RV Percent Paced: 0.01 %
Date Time Interrogation Session: 20220331033623
HighPow Impedance: 77 Ohm
Implantable Lead Implant Date: 20180612
Implantable Lead Location: 753860
Implantable Lead Model: 6935
Implantable Pulse Generator Implant Date: 20180103
Lead Channel Impedance Value: 342 Ohm
Lead Channel Impedance Value: 399 Ohm
Lead Channel Pacing Threshold Amplitude: 1 V
Lead Channel Pacing Threshold Pulse Width: 0.4 ms
Lead Channel Sensing Intrinsic Amplitude: 12.875 mV
Lead Channel Sensing Intrinsic Amplitude: 12.875 mV
Lead Channel Setting Pacing Amplitude: 2.5 V
Lead Channel Setting Pacing Pulse Width: 0.4 ms
Lead Channel Setting Sensing Sensitivity: 0.3 mV

## 2021-03-17 ENCOUNTER — Other Ambulatory Visit: Payer: Self-pay

## 2021-03-17 ENCOUNTER — Emergency Department (HOSPITAL_BASED_OUTPATIENT_CLINIC_OR_DEPARTMENT_OTHER)
Admission: EM | Admit: 2021-03-17 | Discharge: 2021-03-17 | Disposition: A | Discharge status: Home / Self Care | Payer: Medicare Other | Attending: Emergency Medicine | Admitting: Emergency Medicine

## 2021-03-17 ENCOUNTER — Encounter (HOSPITAL_BASED_OUTPATIENT_CLINIC_OR_DEPARTMENT_OTHER): Payer: Self-pay | Admitting: Emergency Medicine

## 2021-03-17 DIAGNOSIS — Z7901 Long term (current) use of anticoagulants: Secondary | ICD-10-CM | POA: Insufficient documentation

## 2021-03-17 DIAGNOSIS — E1122 Type 2 diabetes mellitus with diabetic chronic kidney disease: Secondary | ICD-10-CM | POA: Insufficient documentation

## 2021-03-17 DIAGNOSIS — I130 Hypertensive heart and chronic kidney disease with heart failure and stage 1 through stage 4 chronic kidney disease, or unspecified chronic kidney disease: Secondary | ICD-10-CM | POA: Insufficient documentation

## 2021-03-17 DIAGNOSIS — I5022 Chronic systolic (congestive) heart failure: Secondary | ICD-10-CM | POA: Diagnosis not present

## 2021-03-17 DIAGNOSIS — N183 Chronic kidney disease, stage 3 unspecified: Secondary | ICD-10-CM | POA: Insufficient documentation

## 2021-03-17 DIAGNOSIS — Z95810 Presence of automatic (implantable) cardiac defibrillator: Secondary | ICD-10-CM | POA: Insufficient documentation

## 2021-03-17 DIAGNOSIS — I2510 Atherosclerotic heart disease of native coronary artery without angina pectoris: Secondary | ICD-10-CM | POA: Diagnosis not present

## 2021-03-17 DIAGNOSIS — R339 Retention of urine, unspecified: Secondary | ICD-10-CM

## 2021-03-17 DIAGNOSIS — Z79899 Other long term (current) drug therapy: Secondary | ICD-10-CM | POA: Diagnosis not present

## 2021-03-17 DIAGNOSIS — Z8551 Personal history of malignant neoplasm of bladder: Secondary | ICD-10-CM | POA: Insufficient documentation

## 2021-03-17 DIAGNOSIS — N390 Urinary tract infection, site not specified: Secondary | ICD-10-CM | POA: Insufficient documentation

## 2021-03-17 DIAGNOSIS — Z87891 Personal history of nicotine dependence: Secondary | ICD-10-CM | POA: Insufficient documentation

## 2021-03-17 DIAGNOSIS — Z8546 Personal history of malignant neoplasm of prostate: Secondary | ICD-10-CM | POA: Insufficient documentation

## 2021-03-17 LAB — URINALYSIS, MICROSCOPIC (REFLEX): WBC, UA: >50 WBC/hpf (ref 0–5)

## 2021-03-17 LAB — URINALYSIS, ROUTINE W REFLEX MICROSCOPIC
Bilirubin Urine: NEGATIVE
Glucose, UA: >=500 mg/dL — AB
Ketones, ur: NEGATIVE mg/dL
Nitrite: NEGATIVE
Protein, ur: NEGATIVE mg/dL
Specific Gravity, Urine: 1.015 (ref 1.005–1.030)
pH: 6 (ref 5.0–8.0)

## 2021-03-17 MED ORDER — CEPHALEXIN 500 MG PO CAPS: 1000.0000 mg | ORAL_CAPSULE | Freq: Two times a day (BID) | ORAL | 0 refills | Status: DC

## 2021-03-17 NOTE — ED Notes (Signed)
Pt bladder scanned for 258cc. Pt stated he did void x2 in his brief before nurse scanned bladder. Pt reports feeling better, no pressure and no urge to void. Ellamae Sia

## 2021-03-17 NOTE — ED Notes (Signed)
Pt dischaged with belongings. Pt understands dc instructions, medications, and followup care. Etta Quill.RN

## 2021-03-17 NOTE — ED Notes (Signed)
ED Provider at bedside. 

## 2021-03-17 NOTE — ED Notes (Signed)
Pt reports that he thinks he just urinated (has brief on). Feels relief of pain

## 2021-03-17 NOTE — ED Triage Notes (Signed)
Pt presents to ED POV. Pt c/o urinary retention and pelvic pain. Pt reports that he has had to be catheterized twice in the last 2weeks. Pt reports that he has not urinated since 0800 this morning.

## 2021-03-17 NOTE — ED Provider Notes (Signed)
[facility] EMERGENCY DEPARTMENT Provider Note   CSN: [id number] Arrival date & time: 03/17/21  1501     History Chief Complaint  Patient presents with  . Urinary Retention    Wayne [name] is a 80 y.o. male.  HPI Patient reports he has been unable to urinate for approximately 6 hours before arriving to the emergency department.  He reports he was getting severe pain in his lower abdomen.  Patient reports he has had urinary retention in the past and required Foley catheter placement on 2 previous occasions.  He reports last time he had a catheter placed was about 4 weeks ago.  He did follow-up with urology and had a cystoscopy without any abnormal findings identified.  It was noted that his prostate is slightly enlarged.  Patient reports he had been peeing without significant difficulty up until this morning.  He denies that he been having pain or burning to urinate.  No fevers or chills.  No general malaise.  He reports he was feeling pretty well until this occurred.  No medication changes.  Patient denies any associated back or flank pain.  No nausea or vomiting.  No fevers    Past Medical History:  Diagnosis Date  . AICD (automatic cardioverter/defibrillator) present   . Arthritis   . Cancer [facility])    bladder and prostate  . Cellulitis of left leg    a. 12/9377 complicated by septic shock  . Chronic systolic CHF (congestive heart failure) ([place])   . CKD (chronic kidney disease), stage III ([place])    pt denies  . Colon polyps   . CORONARY ATHEROSCLEROSIS NATIVE CORONARY ARTERY cardiologist-  dr Aundra Dubin   a. 01/2011 Cath/PCI: LM nl, LAD 40-50p, D1 80-small, LCX 95-small, RI 90, RCA 100, EF 20%;  b. 01/2011 Card MRI - No transmural scar;  c. 01/2011 PCI RCA->5 Promus DES, RI->3.0x16 Promus DES; d. Cath 01/13/13 patent LAD & Ramus, diffuse LCx dz, RCA mult overlapping stents w/ 95% osital stenosis, EF 20%, s/p DES to ostial/prox RCA 01/24/13   . Diabetes mellitus, type 2 ([place])    type  2  . GERD (gastroesophageal reflux disease)   . Hematoma of leg    a. left leg hematoma 03/2012 in the setting of asa/effient  . Herpes zoster ophthalmicus    neuropathy  . History of kidney stones   . History of non-ST elevation myocardial infarction (NSTEMI) 06/2012  . History of prostate cancer    dx 2009---  s/p radioactive seed implants 11-24-2008  . HYPERLIPIDEMIA    intolerant to Lipitor (myalgias)  . HYPERTENSION   . Ischemic cardiomyopathy    a. 01/2012 Echo EF 45%, mild LVH; b. KW40%, grade 1 diastolic dysfunction, diffuse hypokinesis, inferoposterior akinesis   . Myocardial infarction ([place])    x2  . Noncompliance   . Obesity   . OSA (obstructive sleep apnea)    dx 2012  severe osa  no cpap just uses nose strips  . PAF (paroxysmal atrial fibrillation) ([place])    first dx 01-16-2017  . Polymyalgia rheumatica ([place])   . S/P drug eluting coronary stent placement    total 6 DES    Patient Active Problem List   Diagnosis Date Noted  . ICD (implantable cardioverter-defibrillator) in place 11/30/2019  . Ischemic cardiomyopathy 11/30/2019  . Bladder cancer ([place]) 11/09/2017  . Low back pain radiating to left lower extremity 09/21/2017  . Foot drop, left 08/24/2017  . Diabetes mellitus ([place])   .  ICD (implantable cardioverter-defibrillator) discharge 07/05/2017  . Syncope and collapse   . Ventricular tachycardia ([place]) 06/18/2017  . Paroxysmal atrial fibrillation ([place]) 06/10/2017  . Hypoxemia   . VT (ventricular tachycardia) ([place])   . Cardiac arrest ([place]) 05/22/2017  . Defibrillator discharge 02/11/2017  . Atrial fibrillation with rapid ventricular response ([place]) 01/16/2017  . Coronary stent occlusion 11/18/2016  . Type 2 diabetes, uncontrolled, with neuropathy ([place]) 09/30/2016  . CKD (chronic kidney disease) stage 3, GFR 30-59 ml/min (HCC) 09/30/2016  . CAD (coronary artery disease) 11/20/2015  . Leucocytoclastic vasculitis ([place]) 06/25/2012  . Staphylococcus aureus  bacteremia with sepsis ([place]) 06/18/2012  . NSTEMI, initial episode of care ([place]) 06/16/2012  . Cellulitis and abscess of lower leg 06/14/2012    Class: Acute  . Healthcare-associated pneumonia 06/14/2012    Class: Acute  . Hyperglycemia 06/14/2012    Class: Acute  . Leg swelling 04/22/2012  . Urethral stricture 04/15/2012  . Gross hematuria 03/22/2012  . Hematochezia 03/22/2012  . Obesity 01/08/2012  . ED (erectile dysfunction) of organic origin 09/11/2011  . OSA (obstructive sleep apnea) 09/02/2011  . Systolic CHF, chronic ([place]) 06/10/2011  . HEMATOCHEZIA 02/10/2011  . BURSITIS, LEFT HIP 02/10/2011  . CORONARY ATHEROSCLEROSIS NATIVE CORONARY ARTERY 01/27/2011  . CHEST PAIN UNSPECIFIED 01/20/2011  . DYSPNEA 01/16/2011  . ABDOMINAL PAIN, UNSPECIFIED SITE 01/13/2011  . ECZEMA 01/28/2010  . BRONCHITIS, ACUTE WITH MILD BRONCHOSPASM 12/21/2009  . HERPES ZOSTER OPHTHALMICUS 07/19/2009  . ADENOCARCINOMA, PROSTATE 06/20/2009  . Secondary DM with peripheral vascular disease, uncontrolled ([place]) 06/20/2009  . Hyperlipidemia 06/20/2009  . Essential hypertension 06/20/2009  . HYPERTROPHY PROSTATE W/UR OBST & OTH LUTS 06/20/2009    Past Surgical History:  Procedure Laterality Date  . CARDIAC CATHETERIZATION  01/24/2013  . CARDIAC CATHETERIZATION N/A 11/14/2016   Procedure: Right/Left Heart Cath and Coronary Angiography;  Surgeon: Larey Dresser, MD;  Location: [facility];  Service: Cardiovascular;  Laterality: N/A;  . CARDIAC CATHETERIZATION N/A 11/17/2016   Procedure: Coronary Stent Intervention w/Impella;  Surgeon: Peter M Martinique, MD;  Location: [facility];  Service: Cardiovascular;  Laterality: N/A;  . CARDIAC CATHETERIZATION N/A 11/17/2016   Procedure: Coronary Atherectomy;  Surgeon: Peter M Martinique, MD;  Location: [facility];  Service: Cardiovascular;  Laterality: N/A;  . CORONARY ANGIOPLASTY WITH STENT PLACEMENT  01-30-2011   dr Aundra Dubin   PTCA and multiple  overlapping DEStenting to RCA and 1 DES to RI   . CORONARY BALLOON ANGIOPLASTY N/A 07/06/2017   Procedure: Coronary Balloon Angioplasty;  Surgeon: Sherren Mocha, MD;  Location: [facility];  Service: Cardiovascular;  Laterality: N/A;  . CYSTOSCOPY WITH FULGERATION N/A 05/25/2018   Procedure: CYSTOSCOPY WITH FULGERATION/BLADDER BIOPSY , 15 mm lesion;  Surgeon: Festus Aloe, MD;  Location: [facility];  Service: Urology;  Laterality: N/A;  . I & D EXTREMITY  06/15/2012   Procedure: IRRIGATION AND DEBRIDEMENT EXTREMITY;  Surgeon: Newt Minion, MD;  Location: [facility];  Service: Orthopedics;  Laterality: Left;  I&D Left Posterior Knee  . I & D EXTREMITY  06/30/2012   Procedure: IRRIGATION AND DEBRIDEMENT EXTREMITY;  Surgeon: Newt Minion, MD;  Location: [facility];  Service: Orthopedics;  Laterality: Left;  Left Leg Irrigation and Debridement and placement of Wound VAC and application of  A-cell  . I & D EXTREMITY  07/20/2012   Procedure: IRRIGATION AND DEBRIDEMENT EXTREMITY;  Surgeon: Newt Minion, MD;  Location: [facility];  Service: Orthopedics;  Laterality: Left;  Irrigation and Debridement Left Leg and Place antibiotic beads   . ICD IMPLANT N/A 02/12/2017   Procedure: ICD Implant;  Surgeon: Evans Lance, MD;  Location: [facility];  Service: Cardiovascular;  Laterality: N/A;  . LAPAROSCOPIC INGUINAL HERNIA REPAIR Right 09-11-2008   dr Barry Dienes  [facility]  . LEAD REVISION/REPAIR N/A 05/26/2017   Procedure: Lead Revision/Repair;  Surgeon: Evans Lance, MD;  Location: [facility];  Service: Cardiovascular;  Laterality: N/A;  . LEFT HEART CATH AND CORONARY ANGIOGRAPHY N/A 07/06/2017   Procedure: Left Heart Cath and Coronary Angiography;  Surgeon: Larey Dresser, MD;  Location: [facility];  Service: Cardiovascular;  Laterality: N/A;  . PERCUTANEOUS CORONARY STENT INTERVENTION (PCI-S) N/A 01/24/2013   Procedure: PERCUTANEOUS CORONARY STENT INTERVENTION (PCI-S);  Surgeon: Wellington Hampshire, MD;   Location: [facility];  Service: Cardiovascular;  Laterality: N/A;  . RADIOACTIVE PROSTATE SEED IMPLANTS  11-24-2008   dr Tresa Endo  [facility]  . TRANSURETHRAL RESECTION OF BLADDER TUMOR N/A 10/30/2017   Procedure: TRANSURETHRAL RESECTION OF BLADDER TUMOR (TURBT)/ INSTILLATION OF EPIRUBICIN;  Surgeon: Festus Aloe, MD;  [facility];  Service: Urology;  Laterality: N/A;  . TRANSURETHRAL RESECTION OF BLADDER TUMOR N/A 05/18/2020   Procedure: TRANSURETHRAL RESECTION OF BLADDER TUMOR (TURBT) 0.5-2.0 cm BILATERAL RETROGRADE PYELOGRAM;  Surgeon: Festus Aloe, MD;  [facility];  Service: Urology;  Laterality: N/A;  . ULTRASOUND GUIDANCE FOR VASCULAR ACCESS  11/14/2016   Procedure: Ultrasound Guidance For Vascular Access;  Surgeon: Larey Dresser, MD;  Location: [facility];  Service: Cardiovascular;;  . Stephanie Coup ABLATION N/A 06/19/2017   Procedure: Stephanie Coup Ablation;  Surgeon: Evans Lance, MD;  Location: [facility];  Service: Cardiovascular;  Laterality: N/A;       Family History  Problem Relation Age of Onset  . Cancer Mother 81       unknown CA  . Heart disease Father   . Alcohol abuse Father   . Heart attack Father 33  . Heart attack Brother     Social History   Tobacco Use  . Smoking status: Former Smoker    Packs/day: 0.30    Years: 20.00    Pack years: 6.00    Types: Cigarettes    Quit date: 02/23/1989    Years since quitting: 32.0  . Smokeless tobacco: Never Used  Vaping Use  . Vaping Use: Never used  Substance Use Topics  . Alcohol use: No  . Drug use: No    Home Medications Prior to Admission medications   Medication Sig Start Date End Date Taking? Authorizing Provider  amiodarone (PACERONE) 200 MG tablet One tablet daily from Monday through Saturday.Do not take on Sunday 12/11/20  Yes Larey Dresser, MD  apixaban (ELIQUIS) 5 MG TABS tablet Take 1 tablet (5 mg total) by mouth 2 (two) times daily. 08/28/20  Yes Larey Dresser, MD   carvedilol (COREG) 25 MG tablet Take 1 tablet (25 mg total) by mouth 2 (two) times daily with a meal. 01/15/21  Yes Larey Dresser, MD  cephALEXin (KEFLEX) 500 MG capsule Take 2 capsules (1,000 mg total) by mouth 2 (two) times daily. 03/17/21  Yes Charlesetta Shanks, MD  dapagliflozin propanediol (FARXIGA) 10 MG TABS tablet Take 1 tablet (10 mg total) by mouth daily before breakfast. 01/15/21  Yes Larey Dresser, MD  diphenhydramine-acetaminophen (TYLENOL PM) 25-500 MG TABS tablet Take 1 tablet at bedtime as needed by  mouth (for sleep).    Yes [provider]  glucose blood (ACCU-CHEK AVIVA PLUS) test strip USE TO CHECK BLOOD SUGAR ONCE TO TWICE DAILY 12/02/19  Yes Burchette, Alinda Sierras, MD  losartan (COZAAR) 25 MG tablet Take 25 mg (1 tablet) every morning and 50 mg (2 tablets) every evening. 02/07/21  Yes Larey Dresser, MD  Multiple Vitamin (MULTIVITAMIN WITH MINERALS) TABS tablet Take 1 tablet by mouth daily. Centrum Silver Adult 50+   Yes [provider]  Omega-3 Fatty Acids (FISH OIL) 1000 MG CAPS Take 1,000 mg by mouth 2 (two) times daily.    Yes [provider]  rosuvastatin (CRESTOR) 20 MG tablet Take 1 tablet (20 mg total) by mouth daily. 01/15/21  Yes Larey Dresser, MD  spironolactone (ALDACTONE) 25 MG tablet Take 0.5 tablets (12.5 mg total) by mouth daily. 04/17/20 04/17/21 Yes Clegg, Amy D, NP  tamsulosin (FLOMAX) 0.4 MG CAPS capsule Take 0.4 mg by mouth every other day. At bedtime 02/17/19  Yes [provider]  TRUEplus Lancets 30G MISC USE TO CHECK BLOOD SUGAR EVERY DAY TO TWICE DAILY 03/15/20  Yes Burchette, Alinda Sierras, MD  furosemide (LASIX) 20 MG tablet Take 1 tablet (20 mg total) by mouth daily. 03/13/21   Larey Dresser, MD    Allergies    Entresto [sacubitril-valsartan], Crestor [rosuvastatin calcium], Lipitor [atorvastatin], Plavix [clopidogrel], and Ancef [cefazolin]  Review of Systems   Review of Systems 10 systems reviewed and negative except as per  HPI Physical Exam Updated Vital Signs BP (!) 108/56   Pulse (!) 54   Temp 97.8 F (36.6 C) (Oral)   Resp 17   Ht 5\' 8"  (1.727 m)   Wt 104.3 kg   SpO2 93%   BMI 34.97 kg/m   Physical Exam Constitutional:      Comments: Alert and nontoxic.  No respiratory distress, mental status clear  Eyes:     Extraocular Movements: Extraocular movements intact.  Cardiovascular:     Rate and Rhythm: Normal rate and regular rhythm.  Pulmonary:     [place]: Pulmonary effort is normal.     Breath sounds: Normal breath sounds.  Abdominal:     General: There is no distension.     Palpations: Abdomen is soft.     Tenderness: There is no abdominal tenderness. There is no guarding.  Genitourinary:    Penis: Normal.   Musculoskeletal:        General: Normal range of motion.  Skin:    General: Skin is warm and dry.  Neurological:     General: No focal deficit present.     Mental Status: He is oriented to person, place, and time.     ED Results / Procedures / Treatments   Labs (all labs ordered are listed, but only abnormal results are displayed) Labs Reviewed  URINALYSIS, ROUTINE W REFLEX MICROSCOPIC - Abnormal; Notable for the following components:      Result Value   APPearance CLOUDY (*)    Glucose, UA >=500 (*)    Hgb urine dipstick MODERATE (*)    Leukocytes,Ua TRACE (*)    All other components within normal limits  URINALYSIS, MICROSCOPIC (REFLEX) - Abnormal; Notable for the following components:   Bacteria, UA FEW (*)    All other components within normal limits  URINE CULTURE    EKG None  Radiology No results found.  Procedures Procedures   Medications Ordered in ED Medications - No data to display  ED Course  I have reviewed the triage vital signs and the nursing notes.  Pertinent labs & imaging results that were available during my care of the patient were reviewed by me and considered in my medical decision making (see chart for details).    MDM  Rules/Calculators/A&P                          Patient came to the emergency department with urinary retention.  He was having severe supra pubic pain.  Patient reports just as he came into the exam room, he did release urine into his depends and got significant amount of relief.  The post void residual was over 200 mL.  Patient has had urinary retention in the past requiring Foley catheter.  At this time we discussed the possibility of recurrent retention and possible repeat visit to the emergency department even later today if he was unable to continue voiding.  Given history, and patient's desire not to have to come back to the emergency department a second time within 24 hours, Foley catheter will be placed and patient will [facility] with [facility].  He is alert and nontoxic.  No CVA tenderness.  Urine does show signs of infection with significant WBC count.  Prior urine culture did not show significant growth.  Will empirically start Keflex.  This is most compatible with patient's Eliquis and Pacerone.  Patient's listed allergy is very mild.  In light of risk benefit of side effects with medications I feel this is the best choice at this time.  Commendations for follow-up on the urine culture and if no growth, discontinuing antibiotics. Final Clinical Impression(s) / ED Diagnoses Final diagnoses:  Urinary retention  Lower urinary tract infectious disease    Rx / DC Orders ED Discharge Orders         Ordered    cephALEXin (KEFLEX) 500 MG capsule  2 times daily        03/17/21 1707           Charlesetta Shanks, MD 03/17/21 1718

## 2021-03-17 NOTE — ED Notes (Signed)
Pt aware urine specimen ordered. Pt reports inability to provide specimen at this time. Specimen collection device provided to patient. 214ml on bladder can

## 2021-03-17 NOTE — Discharge Instructions (Addendum)
1.  Follow-up with [facility] as soon as possible for reevaluation of enlarged prostate and urinary retention. 2.  Your urine shows signs of infection.  A urine culture has been done.  Results should be ready in 1 to 2 days.  Have your urologist follow-up on these results.  You are being given Keflex, and antibiotic to take twice a day.  Take this until your urine culture has resulted, at which time if a change to antibiotic or discontinuation is needed, that can be reviewed by your urologist or family doctor.  Also, urine cultures are reviewed at the hospital and if a change antibiotic is necessary, you will be notified.

## 2021-03-18 ENCOUNTER — Telehealth: Payer: Self-pay

## 2021-03-18 ENCOUNTER — Encounter (HOSPITAL_COMMUNITY): Payer: Medicare Other | Admitting: Cardiology

## 2021-03-18 NOTE — Telephone Encounter (Signed)
The patient states when he try to send a transmission his screen turn orange and do not let him send. I let him know we got a transmission automatically on 03-14-2021. I gave him the number to [facility] support to get additional help. The patient verbalized understanding and thanked me for my help.

## 2021-03-20 LAB — URINE CULTURE: Culture: >=100000 — AB

## 2021-03-21 ENCOUNTER — Telehealth: Payer: Self-pay | Admitting: *Deleted

## 2021-03-21 NOTE — Telephone Encounter (Signed)
Post ED Visit - Positive Culture Follow-up  Culture report reviewed by antimicrobial stewardship pharmacist: [place] Team []  Elenor Quinones, Pharm.D. []  Heide Guile, Pharm.D., BCPS AQ-ID []  Parks Neptune, Pharm.D., BCPS []  Alycia Rossetti, Pharm.D., BCPS []  [place], [place].D., BCPS, AAHIVP []  Legrand Como, Pharm.D., BCPS, AAHIVP []  Salome Arnt, PharmD, BCPS []  Johnnette Gourd, PharmD, BCPS []  Hughes Better, PharmD, BCPS []  Leeroy Cha, PharmD []  Laqueta Linden, PharmD, BCPS []  Albertina Parr, PharmD  [facility] Team []  Leodis Sias, PharmD []  Lindell Spar, PharmD []  Royetta Asal, PharmD []  Graylin Shiver, Rph []  Rema Fendt) Glennon Mac, PharmD []  Arlyn Dunning, PharmD []  Netta Cedars, PharmD []  Dia Sitter, PharmD []  Leone Haven, PharmD []  Gretta Arab, PharmD []  Theodis Shove, PharmD []  Peggyann Juba, PharmD []  Reuel Boom, PharmD   Positive urine culture Treated with Cephalexin, organism sensitive to the same and no further patient follow-up is required at this time./William [place], [place], [place] 03/21/2021, 10:33 AM

## 2021-03-27 DIAGNOSIS — R338 Other retention of urine: Secondary | ICD-10-CM | POA: Diagnosis not present

## 2021-03-27 NOTE — Progress Notes (Signed)
Remote ICD transmission.   

## 2021-03-28 ENCOUNTER — Encounter (HOSPITAL_COMMUNITY): Payer: Medicare Other | Admitting: Cardiology

## 2021-04-03 DIAGNOSIS — R338 Other retention of urine: Secondary | ICD-10-CM | POA: Diagnosis not present

## 2021-04-08 ENCOUNTER — Encounter (HOSPITAL_COMMUNITY): Payer: Medicare Other | Admitting: Cardiology

## 2021-04-16 DIAGNOSIS — M1712 Unilateral primary osteoarthritis, left knee: Secondary | ICD-10-CM | POA: Diagnosis not present

## 2021-04-16 DIAGNOSIS — M25562 Pain in left knee: Secondary | ICD-10-CM | POA: Diagnosis not present

## 2021-04-16 DIAGNOSIS — M25572 Pain in left ankle and joints of left foot: Secondary | ICD-10-CM | POA: Diagnosis not present

## 2021-04-17 DIAGNOSIS — R3914 Feeling of incomplete bladder emptying: Secondary | ICD-10-CM | POA: Diagnosis not present

## 2021-04-25 ENCOUNTER — Telehealth: Payer: Self-pay | Admitting: Family Medicine

## 2021-04-25 NOTE — Telephone Encounter (Signed)
Left message for patient to call back and schedule Medicare Annual Wellness Visit (AWV) either virtually or in office.   AWV-I per [place] 06/15/11  please schedule at anytime with [facility] Nurse Health Advisor 1 or 2   This should be a 45 minute visit.  

## 2021-04-26 ENCOUNTER — Other Ambulatory Visit (HOSPITAL_COMMUNITY): Payer: Self-pay | Admitting: Cardiology

## 2021-05-11 ENCOUNTER — Emergency Department (HOSPITAL_COMMUNITY)
Admission: EM | Admit: 2021-05-11 | Discharge: 2021-05-11 | Disposition: A | Discharge status: Home / Self Care | Payer: Medicare Other | Attending: Emergency Medicine | Admitting: Emergency Medicine

## 2021-05-11 ENCOUNTER — Encounter (HOSPITAL_COMMUNITY): Payer: Self-pay | Admitting: Emergency Medicine

## 2021-05-11 ENCOUNTER — Other Ambulatory Visit: Payer: Self-pay

## 2021-05-11 DIAGNOSIS — Z955 Presence of coronary angioplasty implant and graft: Secondary | ICD-10-CM | POA: Diagnosis not present

## 2021-05-11 DIAGNOSIS — I2510 Atherosclerotic heart disease of native coronary artery without angina pectoris: Secondary | ICD-10-CM | POA: Insufficient documentation

## 2021-05-11 DIAGNOSIS — Z8546 Personal history of malignant neoplasm of prostate: Secondary | ICD-10-CM | POA: Diagnosis not present

## 2021-05-11 DIAGNOSIS — Z87891 Personal history of nicotine dependence: Secondary | ICD-10-CM | POA: Diagnosis not present

## 2021-05-11 DIAGNOSIS — Z7984 Long term (current) use of oral hypoglycemic drugs: Secondary | ICD-10-CM | POA: Insufficient documentation

## 2021-05-11 DIAGNOSIS — Z79899 Other long term (current) drug therapy: Secondary | ICD-10-CM | POA: Diagnosis not present

## 2021-05-11 DIAGNOSIS — E1122 Type 2 diabetes mellitus with diabetic chronic kidney disease: Secondary | ICD-10-CM | POA: Insufficient documentation

## 2021-05-11 DIAGNOSIS — Z7901 Long term (current) use of anticoagulants: Secondary | ICD-10-CM | POA: Diagnosis not present

## 2021-05-11 DIAGNOSIS — I5022 Chronic systolic (congestive) heart failure: Secondary | ICD-10-CM | POA: Diagnosis not present

## 2021-05-11 DIAGNOSIS — I130 Hypertensive heart and chronic kidney disease with heart failure and stage 1 through stage 4 chronic kidney disease, or unspecified chronic kidney disease: Secondary | ICD-10-CM | POA: Insufficient documentation

## 2021-05-11 DIAGNOSIS — N183 Chronic kidney disease, stage 3 unspecified: Secondary | ICD-10-CM | POA: Insufficient documentation

## 2021-05-11 DIAGNOSIS — R339 Retention of urine, unspecified: Secondary | ICD-10-CM | POA: Diagnosis not present

## 2021-05-11 LAB — CBC
HCT: 50.1 % (ref 39.0–52.0)
Hemoglobin: 15.6 g/dL (ref 13.0–17.0)
MCH: 28.8 pg (ref 26.0–34.0)
MCHC: 31.1 g/dL (ref 30.0–36.0)
MCV: 92.6 fL (ref 80.0–100.0)
Platelets: 139 10*3/uL — ABNORMAL LOW (ref 150–400)
RBC: 5.41 MIL/uL (ref 4.22–5.81)
RDW: 14.4 % (ref 11.5–15.5)
WBC: 5.3 10*3/uL (ref 4.0–10.5)
nRBC: 0 % (ref 0.0–0.2)

## 2021-05-11 LAB — URINALYSIS, ROUTINE W REFLEX MICROSCOPIC
Bacteria, UA: NONE SEEN
Bilirubin Urine: NEGATIVE
Glucose, UA: >=500 mg/dL — AB
Ketones, ur: NEGATIVE mg/dL
Leukocytes,Ua: NEGATIVE
Nitrite: NEGATIVE
Protein, ur: NEGATIVE mg/dL
Specific Gravity, Urine: 1.025 (ref 1.005–1.030)
pH: 5 (ref 5.0–8.0)

## 2021-05-11 LAB — BASIC METABOLIC PANEL
Anion gap: 5 (ref 5–15)
BUN: 20 mg/dL (ref 8–23)
CO2: 25 mmol/L (ref 22–32)
Calcium: 9 mg/dL (ref 8.9–10.3)
Chloride: 108 mmol/L (ref 98–111)
Creatinine, Ser: 1.16 mg/dL (ref 0.61–1.24)
GFR, Estimated: >60 mL/min (ref >60)
Glucose, Bld: 242 mg/dL — ABNORMAL HIGH (ref 70–99)
Potassium: 4.6 mmol/L (ref 3.5–5.1)
Sodium: 138 mmol/L (ref 135–145)

## 2021-05-11 NOTE — ED Triage Notes (Signed)
The patient reports, "My kidneys are blocked up on me and I'm really beginning to feel it now." He reports 9/10 lower abdominal pain. Last urination was 1000 today.

## 2021-05-11 NOTE — Discharge Instructions (Addendum)
Continue your current medications, follow up with your urologist to have the catheter removed

## 2021-05-11 NOTE — ED Provider Notes (Signed)
Emergency Medicine Provider Triage Evaluation Note  Wayne [name] , a 80 y.o. male  was evaluated in triage.  Pt complains of urinary retention since 11am this morning. Has had similar sxs in the past.  Review of Systems  Positive: Suprapubic pain, urinary retention Negative: Fevers, nv  Physical Exam  BP 136/80 (BP Location: Right Arm)   Pulse 71   Temp 97.9 F (36.6 C) (Oral)   Resp 18   SpO2 96%  Gen:   Awake, no distress   Resp:  Normal effort  MSK:   Moves extremities without difficulty  Other:  Suprapubic ttp  Medical Decision Making  Medically screening exam initiated at 2:36 PM.  Appropriate orders placed.  TAELOR MONCADA was informed that the remainder of the evaluation will be completed by another provider, this initial triage assessment does not replace that evaluation, and the importance of remaining in the ED until their evaluation is complete.     Rodney Booze, PA-C 05/11/21 1436    Sherwood Gambler, MD 05/11/21 [phone]

## 2021-05-11 NOTE — ED Provider Notes (Signed)
[facility] DEPT Provider Note   CSN: [id number] Arrival date & time: 05/11/21  1345     History Chief Complaint  Patient presents with  . Urinary Retention    Wayne [name] is a 80 y.o. male.  HPI   Patient presents to the ER for evaluation of urinary retention.  Patient states since 1 AM he has not been able to urinate normally.  He feels like his bladder is filling up and he feels like he is in urinary retention.  Patient has had this happen to him before and is familiar with symptoms.  He denies any fevers or chills.  No vomiting or diarrhea.  Past Medical History:  Diagnosis Date  . AICD (automatic cardioverter/defibrillator) present   . Arthritis   . Cancer [facility])    bladder and prostate  . Cellulitis of left leg    a. 06/6194 complicated by septic shock  . Chronic systolic CHF (congestive heart failure) ([place])   . CKD (chronic kidney disease), stage III ([place])    pt denies  . Colon polyps   . CORONARY ATHEROSCLEROSIS NATIVE CORONARY ARTERY cardiologist-  dr Aundra Dubin   a. 01/2011 Cath/PCI: LM nl, LAD 40-50p, D1 80-small, LCX 95-small, RI 90, RCA 100, EF 20%;  b. 01/2011 Card MRI - No transmural scar;  c. 01/2011 PCI RCA->5 Promus DES, RI->3.0x16 Promus DES; d. Cath 01/13/13 patent LAD & Ramus, diffuse LCx dz, RCA mult overlapping stents w/ 95% osital stenosis, EF 20%, s/p DES to ostial/prox RCA 01/24/13   . Diabetes mellitus, type 2 ([place])    type 2  . GERD (gastroesophageal reflux disease)   . Hematoma of leg    a. left leg hematoma 03/2012 in the setting of asa/effient  . Herpes zoster ophthalmicus    neuropathy  . History of kidney stones   . History of non-ST elevation myocardial infarction (NSTEMI) 06/2012  . History of prostate cancer    dx 2009---  s/p radioactive seed implants 11-24-2008  . HYPERLIPIDEMIA    intolerant to Lipitor (myalgias)  . HYPERTENSION   . Ischemic cardiomyopathy    a. 01/2012 Echo EF 45%, mild LVH; b. EF15%, grade 1  diastolic dysfunction, diffuse hypokinesis, inferoposterior akinesis   . Myocardial infarction ([place])    x2  . Noncompliance   . Obesity   . OSA (obstructive sleep apnea)    dx 2012  severe osa  no cpap just uses nose strips  . PAF (paroxysmal atrial fibrillation) ([place])    first dx 01-16-2017  . Polymyalgia rheumatica ([place])   . S/P drug eluting coronary stent placement    total 6 DES    Patient Active Problem List   Diagnosis Date Noted  . ICD (implantable cardioverter-defibrillator) in place 11/30/2019  . Ischemic cardiomyopathy 11/30/2019  . Bladder cancer ([place]) 11/09/2017  . Low back pain radiating to left lower extremity 09/21/2017  . Foot drop, left 08/24/2017  . Diabetes mellitus ([place])   . ICD (implantable cardioverter-defibrillator) discharge 07/05/2017  . Syncope and collapse   . Ventricular tachycardia ([place]) 06/18/2017  . Paroxysmal atrial fibrillation ([place]) 06/10/2017  . Hypoxemia   . VT (ventricular tachycardia) ([place])   . Cardiac arrest ([place]) 05/22/2017  . Defibrillator discharge 02/11/2017  . Atrial fibrillation with rapid ventricular response ([place]) 01/16/2017  . Coronary stent occlusion 11/18/2016  . Type 2 diabetes, uncontrolled, with neuropathy ([place]) 09/30/2016  . CKD (chronic kidney disease) stage 3, GFR 30-59 ml/min (HCC) 09/30/2016  . CAD (coronary  artery disease) 11/20/2015  . Leucocytoclastic vasculitis ([place]) 06/25/2012  . Staphylococcus aureus bacteremia with sepsis ([place]) 06/18/2012  . NSTEMI, initial episode of care ([place]) 06/16/2012  . Cellulitis and abscess of lower leg 06/14/2012    Class: Acute  . Healthcare-associated pneumonia 06/14/2012    Class: Acute  . Hyperglycemia 06/14/2012    Class: Acute  . Leg swelling 04/22/2012  . Urethral stricture 04/15/2012  . Gross hematuria 03/22/2012  . Hematochezia 03/22/2012  . Obesity 01/08/2012  . ED (erectile dysfunction) of organic origin 09/11/2011  . OSA (obstructive sleep apnea) 09/02/2011  .  Systolic CHF, chronic ([place]) 06/10/2011  . HEMATOCHEZIA 02/10/2011  . BURSITIS, LEFT HIP 02/10/2011  . CORONARY ATHEROSCLEROSIS NATIVE CORONARY ARTERY 01/27/2011  . CHEST PAIN UNSPECIFIED 01/20/2011  . DYSPNEA 01/16/2011  . ABDOMINAL PAIN, UNSPECIFIED SITE 01/13/2011  . ECZEMA 01/28/2010  . BRONCHITIS, ACUTE WITH MILD BRONCHOSPASM 12/21/2009  . HERPES ZOSTER OPHTHALMICUS 07/19/2009  . ADENOCARCINOMA, PROSTATE 06/20/2009  . Secondary DM with peripheral vascular disease, uncontrolled ([place]) 06/20/2009  . Hyperlipidemia 06/20/2009  . Essential hypertension 06/20/2009  . HYPERTROPHY PROSTATE W/UR OBST & OTH LUTS 06/20/2009    Past Surgical History:  Procedure Laterality Date  . CARDIAC CATHETERIZATION  01/24/2013  . CARDIAC CATHETERIZATION N/A 11/14/2016   Procedure: Right/Left Heart Cath and Coronary Angiography;  Surgeon: Larey Dresser, MD;  Location: [place] [facility];  Service: Cardiovascular;  Laterality: N/A;  . CARDIAC CATHETERIZATION N/A 11/17/2016   Procedure: Coronary Stent Intervention w/Impella;  Surgeon: Peter M Martinique, MD;  Location: [place] [facility] LAB;  Service: Cardiovascular;  Laterality: N/A;  . CARDIAC CATHETERIZATION N/A 11/17/2016   Procedure: Coronary Atherectomy;  Surgeon: Peter M Martinique, MD;  Location: [place] [facility];  Service: Cardiovascular;  Laterality: N/A;  . CORONARY ANGIOPLASTY WITH STENT PLACEMENT  01-30-2011   dr Aundra Dubin   PTCA and multiple overlapping DEStenting to RCA and 1 DES to RI   . CORONARY BALLOON ANGIOPLASTY N/A 07/06/2017   Procedure: Coronary Balloon Angioplasty;  Surgeon: Sherren Mocha, MD;  Location: [place] [facility] LAB;  Service: Cardiovascular;  Laterality: N/A;  . CYSTOSCOPY WITH FULGERATION N/A 05/25/2018   Procedure: CYSTOSCOPY WITH FULGERATION/BLADDER BIOPSY , 15 mm lesion;  Surgeon: Festus Aloe, MD;  [facility]: [facility];  Service: Urology;  Laterality: N/A;  . I & D EXTREMITY  06/15/2012   Procedure: IRRIGATION AND DEBRIDEMENT  EXTREMITY;  Surgeon: Newt Minion, MD;  Location: [facility];  Service: Orthopedics;  Laterality: Left;  I&D Left Posterior Knee  . I & D EXTREMITY  06/30/2012   Procedure: IRRIGATION AND DEBRIDEMENT EXTREMITY;  Surgeon: Newt Minion, MD;  Location: [facility];  Service: Orthopedics;  Laterality: Left;  Left Leg Irrigation and Debridement and placement of Wound VAC and application of  A-cell  . I & D EXTREMITY  07/20/2012   Procedure: IRRIGATION AND DEBRIDEMENT EXTREMITY;  Surgeon: Newt Minion, MD;  Location: [facility];  Service: Orthopedics;  Laterality: Left;  Irrigation and Debridement Left Leg and Place antibiotic beads   . ICD IMPLANT N/A 02/12/2017   Procedure: ICD Implant;  Surgeon: Evans Lance, MD;  Location: [place] [facility];  Service: Cardiovascular;  Laterality: N/A;  . LAPAROSCOPIC INGUINAL HERNIA REPAIR Right 09-11-2008   dr Barry Dienes  [facility]  . LEAD REVISION/REPAIR N/A 05/26/2017   Procedure: Lead Revision/Repair;  Surgeon: Evans Lance, MD;  Location: [place] [facility];  Service: Cardiovascular;  Laterality: N/A;  . LEFT HEART CATH AND CORONARY  ANGIOGRAPHY N/A 07/06/2017   Procedure: Left Heart Cath and Coronary Angiography;  Surgeon: Larey Dresser, MD;  Location: [facility];  Service: Cardiovascular;  Laterality: N/A;  . PERCUTANEOUS CORONARY STENT INTERVENTION (PCI-S) N/A 01/24/2013   Procedure: PERCUTANEOUS CORONARY STENT INTERVENTION (PCI-S);  Surgeon: Wellington Hampshire, MD;  Location: [facility] CATH LAB;  Service: Cardiovascular;  Laterality: N/A;  . RADIOACTIVE PROSTATE SEED IMPLANTS  11-24-2008   dr Tresa Endo  [facility]  . TRANSURETHRAL RESECTION OF BLADDER TUMOR N/A 10/30/2017   Procedure: TRANSURETHRAL RESECTION OF BLADDER TUMOR (TURBT)/ INSTILLATION OF EPIRUBICIN;  Surgeon: Festus Aloe, MD;  [facility];  Service: Urology;  Laterality: N/A;  . TRANSURETHRAL RESECTION OF BLADDER TUMOR N/A 05/18/2020   Procedure: TRANSURETHRAL RESECTION OF BLADDER TUMOR (TURBT) 0.5-2.0 cm  BILATERAL RETROGRADE PYELOGRAM;  Surgeon: Festus Aloe, MD;  [facility];  Service: Urology;  Laterality: N/A;  . ULTRASOUND GUIDANCE FOR VASCULAR ACCESS  11/14/2016   Procedure: Ultrasound Guidance For Vascular Access;  Surgeon: Larey Dresser, MD;  Location: [place] [facility];  Service: Cardiovascular;;  . Stephanie Coup ABLATION N/A 06/19/2017   Procedure: Stephanie Coup Ablation;  Surgeon: Evans Lance, MD;  Location: [facility] [facility];  Service: Cardiovascular;  Laterality: N/A;       Family History  Problem Relation Age of Onset  . Cancer Mother 61       unknown CA  . Heart disease Father   . Alcohol abuse Father   . Heart attack Father 67  . Heart attack Brother     Social History   Tobacco Use  . Smoking status: Former Smoker    Packs/day: 0.30    Years: 20.00    Pack years: 6.00    Types: Cigarettes    Quit date: 02/23/1989    Years since quitting: 32.2  . Smokeless tobacco: Never Used  Vaping Use  . Vaping Use: Never used  Substance Use Topics  . Alcohol use: No  . Drug use: No    Home Medications Prior to Admission medications   Medication Sig Start Date End Date Taking? Authorizing Provider  amiodarone (PACERONE) 200 MG tablet One tablet daily from Monday through Saturday.Do not take on Sunday 12/11/20   Larey Dresser, MD  carvedilol (COREG) 25 MG tablet Take 1 tablet (25 mg total) by mouth 2 (two) times daily with a meal. 01/15/21   Larey Dresser, MD  cephALEXin (KEFLEX) 500 MG capsule Take 2 capsules (1,000 mg total) by mouth 2 (two) times daily. 03/17/21   Charlesetta Shanks, MD  dapagliflozin propanediol (FARXIGA) 10 MG TABS tablet Take 1 tablet (10 mg total) by mouth daily before breakfast. 01/15/21   Larey Dresser, MD  diphenhydramine-acetaminophen (TYLENOL PM) 25-500 MG TABS tablet Take 1 tablet at bedtime as needed by mouth (for sleep).     [provider]  ELIQUIS 5 MG TABS tablet TAKE 1 TABLET(5 MG) BY MOUTH TWICE DAILY 04/26/21   Larey Dresser, MD  furosemide (LASIX) 20 MG tablet Take 1 tablet (20 mg total) by mouth daily. 03/13/21   Larey Dresser, MD  glucose blood (ACCU-CHEK AVIVA PLUS) test strip USE TO CHECK BLOOD SUGAR ONCE TO TWICE DAILY 12/02/19   Burchette, Alinda Sierras, MD  losartan (COZAAR) 25 MG tablet Take 25 mg (1 tablet) every morning and 50 mg (2 tablets) every evening. 02/07/21   Larey Dresser, MD  Multiple Vitamin (MULTIVITAMIN WITH MINERALS) TABS tablet Take 1 tablet  by mouth daily. Centrum Silver Adult 50+    [provider]  Omega-3 Fatty Acids (FISH OIL) 1000 MG CAPS Take 1,000 mg by mouth 2 (two) times daily.     [provider]  rosuvastatin (CRESTOR) 20 MG tablet Take 1 tablet (20 mg total) by mouth daily. 01/15/21   Larey Dresser, MD  spironolactone (ALDACTONE) 25 MG tablet Take 0.5 tablets (12.5 mg total) by mouth daily. 04/17/20 04/17/21  Clegg, Amy D, NP  tamsulosin (FLOMAX) 0.4 MG CAPS capsule Take 0.4 mg by mouth every other day. At bedtime 02/17/19   [provider]  TRUEplus Lancets 30G MISC USE TO CHECK BLOOD SUGAR EVERY DAY TO TWICE DAILY 03/15/20   Burchette, Alinda Sierras, MD    Allergies    Delene Loll [sacubitril-valsartan], Crestor [rosuvastatin calcium], Lipitor [atorvastatin], Plavix [clopidogrel], and Ancef [cefazolin]  Review of Systems   Review of Systems  All other systems reviewed and are negative.   Physical Exam Updated Vital Signs BP 136/78 (BP Location: Left Arm)   Pulse 70   Temp 98.4 F (36.9 C) (Oral)   Resp 18   SpO2 91%   Physical Exam Vitals and nursing note reviewed.  Constitutional:      General: He is not in acute distress.    Appearance: He is well-developed.  HENT:     Head: Normocephalic and atraumatic.     Right Ear: External ear normal.     Left Ear: External ear normal.  Eyes:     General: No scleral icterus.       Right eye: No discharge.        Left eye: No discharge.     Conjunctiva/sclera: Conjunctivae normal.  Neck:     Trachea: No  tracheal deviation.  Cardiovascular:     Rate and Rhythm: Normal rate and regular rhythm.  Pulmonary:     Effort: Pulmonary effort is normal. No respiratory distress.     Breath sounds: Normal breath sounds. No stridor.  Abdominal:     Palpations: There is no mass.     Comments: Protuberant abdomen, tender to palpation suprapubic region  Musculoskeletal:        General: No swelling or deformity.     Cervical back: Neck supple.  Skin:    General: Skin is warm and dry.     Findings: No rash.  Neurological:     Mental Status: He is alert.     Cranial Nerves: Cranial nerve deficit: no gross deficits.     ED Results / Procedures / Treatments   Labs (all labs ordered are listed, but only abnormal results are displayed) Labs Reviewed  URINALYSIS, ROUTINE W REFLEX MICROSCOPIC - Abnormal; Notable for the following components:      Result Value   Glucose, UA >=500 (*)    Hgb urine dipstick MODERATE (*)    All other components within normal limits  CBC - Abnormal; Notable for the following components:   Platelets 139 (*)    All other components within normal limits  BASIC METABOLIC PANEL - Abnormal; Notable for the following components:   Glucose, Bld 242 (*)    All other components within normal limits    EKG None  Radiology No results found.  Procedures Procedures   Medications Ordered in ED Medications - No data to display  ED Course  I have reviewed the triage vital signs and the nursing notes.  Pertinent labs & imaging results that were available during my care of the  patient were reviewed by me and considered in my medical decision making (see chart for details).  Clinical Course as of 05/12/21 1310  Sat May 11, 2021  1937 CBC and metabolic panel notable for hyperglycemia.  Urine notable for red blood cells but no signs of infection [JK]    Clinical Course User Index [JK] Dorie Rank, MD   MDM Rules/Calculators/A&P                          Patient's  laboratory test did not show any signs of renal failure.  Urinalysis without signs of infection.  Patient did have urinary retention while in the ED.  He was able to urinate somewhat but still felt bloated and that he is not emptying his bladder properly.  coud catheter placed by nursing staff.  Continue to strain urine.  Feeling much better.  Discussed outpatient follow-up with urology Final Clinical Impression(s) / ED Diagnoses Final diagnoses:  Urinary retention    Rx / DC Orders ED Discharge Orders    None       Dorie Rank, MD 05/12/21 1310

## 2021-05-15 NOTE — Research (Addendum)
Late entry:  Saw patient after dr Wayne [name] saw patient in clinic.  Patient is doing ok, no complaints.  Dr Wayne [name] increased [place] and started Farxiga Weight down 5 pounds  No SOB today while walking.

## 2021-05-16 DIAGNOSIS — C678 Malignant neoplasm of overlapping sites of bladder: Secondary | ICD-10-CM | POA: Diagnosis not present

## 2021-05-16 DIAGNOSIS — R338 Other retention of urine: Secondary | ICD-10-CM | POA: Diagnosis not present

## 2021-05-16 DIAGNOSIS — N35012 Post-traumatic membranous urethral stricture: Secondary | ICD-10-CM | POA: Diagnosis not present

## 2021-05-30 DIAGNOSIS — Z8551 Personal history of malignant neoplasm of bladder: Secondary | ICD-10-CM | POA: Diagnosis not present

## 2021-05-30 DIAGNOSIS — N35012 Post-traumatic membranous urethral stricture: Secondary | ICD-10-CM | POA: Diagnosis not present

## 2021-05-30 DIAGNOSIS — R3914 Feeling of incomplete bladder emptying: Secondary | ICD-10-CM | POA: Diagnosis not present

## 2021-06-18 ENCOUNTER — Telehealth: Payer: Self-pay | Admitting: Family Medicine

## 2021-06-18 NOTE — Telephone Encounter (Signed)
Left message for patient to call back and schedule Medicare Annual Wellness Visit (AWV) either virtually or in office.   AWV-I per [place] 06/15/11  please schedule at anytime with [facility] Nurse Health Advisor 1 or 2   This should be a 45 minute visit.

## 2021-06-25 ENCOUNTER — Other Ambulatory Visit: Payer: Self-pay

## 2021-06-25 ENCOUNTER — Ambulatory Visit (HOSPITAL_COMMUNITY)
Admission: RE | Admit: 2021-06-25 | Discharge: 2021-06-25 | Disposition: A | Discharge status: Home / Self Care | Payer: Medicare Other | Source: Ambulatory Visit | Attending: Cardiology | Admitting: Cardiology

## 2021-06-25 DIAGNOSIS — I447 Left bundle-branch block, unspecified: Secondary | ICD-10-CM | POA: Diagnosis not present

## 2021-06-25 DIAGNOSIS — I5022 Chronic systolic (congestive) heart failure: Secondary | ICD-10-CM | POA: Insufficient documentation

## 2021-06-25 LAB — BASIC METABOLIC PANEL
Anion gap: 6 (ref 5–15)
BUN: 15 mg/dL (ref 8–23)
CO2: 28 mmol/L (ref 22–32)
Calcium: 9.1 mg/dL (ref 8.9–10.3)
Chloride: 106 mmol/L (ref 98–111)
Creatinine, Ser: 1.2 mg/dL (ref 0.61–1.24)
GFR, Estimated: >60 mL/min (ref >60)
Glucose, Bld: 249 mg/dL — ABNORMAL HIGH (ref 70–99)
Potassium: 4.5 mmol/L (ref 3.5–5.1)
Sodium: 140 mmol/L (ref 135–145)

## 2021-06-25 MED ORDER — POTASSIUM CHLORIDE CRYS ER 20 MEQ PO TBCR: 20.0000 meq | EXTENDED_RELEASE_TABLET | ORAL | 0 refills | Status: DC

## 2021-06-25 MED ORDER — CARVEDILOL 25 MG PO TABS: 12.5000 mg | ORAL_TABLET | Freq: Two times a day (BID) | ORAL | 3 refills | Status: DC

## 2021-06-25 NOTE — Patient Instructions (Signed)
Decrease Carvedilol to 12.5 mg (1/2 tab) Twice daily   Increase Furosemide to 40 mg (2 tabs) Twice daily FOR 3 DAYS, then back to 20 mg Twice daily   Take Potassium 20 meq Daily for 3 days ONLY  Labs today, we will call you for abnormal results  Your physician recommends that you return for lab work in: 1 week  Your physician recommends that you schedule a follow-up appointment in: 2-3 weeks  Do the following things EVERYDAY: Weigh yourself in the morning before breakfast. Write it down and keep it in a log. Take your medicines as prescribed Eat low salt foods--Limit salt (sodium) to 2000 mg per day.  Stay as active as you can everyday Limit all fluids for the day to less than 2 liters  Limit your fluid intake to less than 2 liters of fluid per day. Fluid includes all drinks, coffee, juice, ice chips, soup, jello, and all other liquids.  Restrict your sodium intake to less than 2000mg  per day. This will help prevent your body from holding onto fluid. Read food labels as a lot of canned and packaged foods have a lot of sodium.  If you have any questions or concerns before your next appointment please send [place] a message through [place] or call our office at [phone].    TO LEAVE A MESSAGE FOR THE NURSE SELECT OPTION 2, PLEASE LEAVE A MESSAGE INCLUDING: YOUR NAME DATE OF BIRTH CALL BACK NUMBER REASON FOR CALL**this is important as we prioritize the call backs  YOU WILL RECEIVE A CALL BACK THE SAME DAY AS LONG AS YOU CALL BEFORE 4:00 PM  At the [facility], you and your health needs are our priority. As part of our continuing mission to provide you with exceptional heart care, we have created designated Provider Care Teams. These Care Teams include your primary Cardiologist (physician) and Advanced Practice Providers (APPs- Physician Assistants and Nurse Practitioners) who all work together to provide you with the care you need, when you need it.   You may see any  of the following providers on your designated Care Team at your next follow up: Dr Glori Bickers Dr Loralie Champagne Dr Patrice Paradise, NP Lyda Jester, [place] Ginnie Smart Audry Riles, PharmD   Please be sure to bring in all your medications bottles to every appointment.

## 2021-06-25 NOTE — Progress Notes (Signed)
Optivol elevated, ReDS 34%, VS stable, EKG SB 52, +abd distension, + LE edema, SOB with exertion, + orthopnea, + bendopnea  Discussed all with Allena Katz, NP she advised pt to decrease Carvedilol to 12.5 mg BID, increase Lasix to 40 mg BID x 3 days with KCL 20 meq x 3 days, enroll in home Optivol readings and repeat in 7-10 days, bmet today and again in 1 week, f/u in 3-4 weeks  Pt aware, agreeable and verbalized understanding. We discussed fluid and salt intake. Pt and wife admits to increased fluid, over 2 L/day and adding salt to food. He has been eating watermelon with salt on it and cheese sticks. Wife reports pt adds salt to most food.  Had lengthy discussion with pt and his wife about fluid/salt balance and restrictions, discussed not using salt shaker and reading labels. They are agreeable to cutting down and being more conscious of intake.

## 2021-06-25 NOTE — Progress Notes (Signed)
Pt walked in c/o SOB, abd swelling, and fatigue. VS stable, will obtain ReDS, Optivol reading, and EKG to review with provider. Pt is unsure about medications but states he is taking Lasix 20 mg BID.

## 2021-06-25 NOTE — Progress Notes (Signed)
ReDS Vest / Clip - 06/25/21 1200       ReDS Vest / Clip   Station Marker D    Ruler Value 35.5    ReDS Value Range Low volume    ReDS Actual Value 34

## 2021-07-03 ENCOUNTER — Ambulatory Visit (HOSPITAL_COMMUNITY)
Admission: RE | Admit: 2021-07-03 | Discharge: 2021-07-03 | Disposition: A | Discharge status: Home / Self Care | Payer: Medicare Other | Source: Ambulatory Visit | Attending: Cardiology | Admitting: Cardiology

## 2021-07-03 ENCOUNTER — Other Ambulatory Visit: Payer: Self-pay

## 2021-07-03 ENCOUNTER — Telehealth: Payer: Self-pay

## 2021-07-03 ENCOUNTER — Ambulatory Visit (INDEPENDENT_AMBULATORY_CARE_PROVIDER_SITE_OTHER): Payer: Medicare Other

## 2021-07-03 DIAGNOSIS — I5022 Chronic systolic (congestive) heart failure: Secondary | ICD-10-CM | POA: Diagnosis not present

## 2021-07-03 DIAGNOSIS — Z95810 Presence of automatic (implantable) cardiac defibrillator: Secondary | ICD-10-CM

## 2021-07-03 LAB — BASIC METABOLIC PANEL
Anion gap: 9 (ref 5–15)
BUN: 22 mg/dL (ref 8–23)
CO2: 28 mmol/L (ref 22–32)
Calcium: 9.3 mg/dL (ref 8.9–10.3)
Chloride: 102 mmol/L (ref 98–111)
Creatinine, Ser: 1.26 mg/dL — ABNORMAL HIGH (ref 0.61–1.24)
GFR, Estimated: 59 mL/min — ABNORMAL LOW (ref >60)
Glucose, Bld: 290 mg/dL — ABNORMAL HIGH (ref 70–99)
Potassium: 4 mmol/L (ref 3.5–5.1)
Sodium: 139 mmol/L (ref 135–145)

## 2021-07-03 NOTE — Telephone Encounter (Signed)
-----   Message from Scarlette Calico, RN sent at 06/25/2021  1:04 PM EDT ----- Wayne [name] can you start following this guys Optivol please, we saw him today and it was up, so we would like it rechecked in about 7-10 days please.

## 2021-07-03 NOTE — Telephone Encounter (Signed)
Pt referred to [facility] clinic by Allena Katz, NP.  Attempted ICM intro call to patient and to request a remote transmission for HF clinic review. Left message regarding remote transmission and call back with ICM direct number.

## 2021-07-03 NOTE — Telephone Encounter (Signed)
Remote transmission received.  Call to patient and advised remote transmission suggesting fluid levels have returned to normal after dosage increase at time of HF clinic OV last week.  He understands too much salt can cause fluid accumulation.  He admits to adding salt to his foods such as tomatoes, watermelon, cucumbers and cantaloupe.  Discussed limiting salt intake to 2000 mg daily and to read food labels for salt amount per serving.  Advised the shake of a salt shaker can add up to 250 mg for each shake. Advised one teaspoon of salt = 2,300 mg.  Patient agreeable to next remote transmission fluid level check on 8/8.  He keeps monitor by bedside.   He has resumed his prescribed dosage of Lasix 20 mg 1 tablet twice a day.    Advised to call if he experiences any fluid symptoms before next remote transmission.  He has ICM direct number.   Copy sent to [facility], NP as FYI.     07/03/2021 Optivol thoracic impedance suggesting fluid levels have returned to normal.

## 2021-07-03 NOTE — Telephone Encounter (Signed)
Patient returned call.  Provided ICM intro and patient agreeable to monthly ICM follow up.  Explained Allena Katz,, NP at [facility] clinic requested a remote transmission today to review fluid levels following his HF clinic visit last week.  Pt will send report

## 2021-07-03 NOTE — Progress Notes (Signed)
EPIC Encounter for ICM Monitoring  Patient Name: Wayne [name] is a 80 y.o. male Date: 07/03/2021 Primary Care Physican: [name], MD Primary Cardiologist: Aundra Dubin Electrophysiologist: Lovena Le 06/25/2021 Weight: 239 lbs        1st ICM Remote Transmission. Heart Failure questions reviewed.  Pt asymptomatic for fluid today.   He gets SOB and abdominal swelling when he experiences fluid symptoms but none today.  Lasix was doubled last week in HF clinic   Diet:  Patient is aware salt intake may contribute to fluid accumulation.  He admits to adding salt to his summer fruits and vegetables.  Optivol thoracic impedance suggesting fluid levels returned to normal after HF doubled Lasix x 3 days last week.   Prescribed:  Furosemide 20 mg take 1 tablet (20 mg total) by mouth twice a day. Potassium 20 mEq  take 1 tablet (20 mEq total) by mouth daily. Spironolactone 25 mg take 0.5 tablet (12.5 mg total) daily  Labs: 07/03/2021 Creatinine 1.26, BUN 22, Potassium 4.0, Sodium 139, GFR 59 06/25/2021 Creatinine 1.20, BUN 15, Potassium 4.5, Sodium 140, GFR >60  05/11/2021 Creatinine 1.16, BUN 20, Potassium 4.6, Sodium 138, GFR >60  A complete set of results can be found in Results Review.  Recommendations: Reinforced limiting salt intake to < 2000 mg daily and fluid intake to 64 oz daily.  Encouraged to call if experiencing fluid symptoms.  Follow-up plan: ICM clinic phone appointment on 07/22/2021.   91 day device clinic remote transmission 09/12/2021.    EP/Cardiology Office Visits: 07/19/2021 with HF clinic PA/NP.    Copy of ICM check sent to Dr. Lovena Le and phone note sent to Allena Katz, NP at [name] clinic for [name] on follow up transmission.   3 month ICM trend: 07/03/2021.    1 Year ICM trend:       Rosalene Billings, RN 07/03/2021 1:15 PM

## 2021-07-03 NOTE — Telephone Encounter (Signed)
[place], Maricela Bo, FNP  Cloyd Ragas Panda, RN Caller: Unspecified (Today, 10:52 AM) Thanks Margarita Grizzle!

## 2021-07-18 NOTE — Progress Notes (Addendum)
Patient ID: Wayne [name], male   DOB: 1941-07-20, 80 y.o.   MRN: [id number]   Advanced [facility] Failure Clinic Note   Patient ID: Wayne [name], male   DOB: October 01, 1941, 80 y.o.   MRN: [id number] PCP: Dr. Elease Hashimoto HF Cardiology: Dr. Earline Mayotte Wayne [name] is a 80 y.o. male  with history of DM, HTN, CAD, and ischemic cardiomyopathy who presents for followup of CHF and CAD. Patient had a CHF exacerbation in 2/12 and was found to have LV systolic dysfunction with EF around 20%. LHC showed RCA, ramus, and CFX disease. RCA was subtotally occluded. Cardiac MRI showed that all walls, including the inferior wall, should be viable. Patient therefore underwent opening of his chronic totally occluded RCA as well as PCI to the ramus in 2/12. He received drug eluting stents and was on Effient.  He had an echo in 2/13 that showed EF improved to 45% with moderate LV dilation and mild LV hypertrophy.   In 5/13, he developed a large left lower leg hematoma.  He was still on Effient at that time.  ASA and Effient were stopped.  The hematoma did not resolve.  In 7/13, he was re-admitted with septic shock from MSSA from an abscess in his left gastrocnemius.  He also grew Pseudomonas from the left gastrocnemius as well.  He had a prolonged course in the hospital and later in a rehab facility.  Ultimately, he got back home again.  I had him get an echo in 1/14, and this showed EF 15% with diffuse hypokinesis and inferoposterior akinesis.  He had been off of most of his prior cardiac medications.  Took him back for [facility] in 1/14.  This showed subtotal occlusion of a small AV LCx and 95% ostial in-stent restenosis in the RCA.  He was treated in 2/14 with a Xience DES to the ostial RCA and begun on Plavix.  Unfortunately, he developed diffuse hives after starting Plavix and had to be switched to ticlopidine.  He has tolerated ticlopidine.   Echo done in 12/14 showed some improvement in LV function but EF was still low at 30-35%.  He did not  want ICD.    Presented to ED 11/14/15 with worsening SOB after a few steps and CXR with CHF and bilateral effusions in the setting of marked medical non-compliance. States he stopped taking his medicines in 01/2015 (except for ASA 81 and a multivitamin).  He was feeling good and decided that he did not need them anymore.  Also c/o URI symptoms. Diuresed over 2 L with IV diuretics in the ER and started on lasix 20 mg daily.   Echo (12/16) showed EF 20-25% with diffuse hypokinesis (down from 35% in 12/15).  Echo 10/17 showed EF 15% with mildly decreased RV systolic function.    He had left and right heart catheterization in 12/17. This showed elevated right and left heart filling pressures and low cardiac output. The RCA was totally occluded with collaterals, there was 95% ostial to mid LAD stenosis.  Patient had PCI with DES to proximal-mid LAD.    He was noted to be in atrial fibrillation with RVR in 2/18 at cardiac rehab. He felt fatigued.  He was admitted and started on amiodarone for rate control. ASA was stopped, ticagrelor was decreased to 60 mg bid, and Xarelto 15 mg daily was started.  He converted spontaneously back to NSR.    He suffered a ventricular fibrillation arrest in 3/18.  Luckily, he was  wearing a Lifevest and was shocked.  He was admitted and started on amiodarone.  Medtronic ICD was placed.  Echo 3/18 with EF 20-25%.   Admitted again 6/18 due to VF arrest. ICD lead had been undersensing leading to prolongation of time to discharge.  He underwent RV lead extraction with new lead implant.   He was admitted again with VT storm in 7/18. He had coronary angiography this admission.  Had cutting balloon angioplasty of severe in-stent restenosis in the proximal LAD.  He also had VT ablation in 7/18.   He had TURBT 11/18, urothelial cancer diagnosed.  TURBT again in 6/21.   Echo in 8/21 showed EF 25%, diffuse hypokinesis, moderate LV dilation, normal RV, IVC normal.   Several ED visits  for acute urinary retention, with the most recent one requiring insertion of coude catheter. He saw urology this week and had says he underwent urethral dilatation.  Today he returns for HF follow up with his wife. He feels his fluid is up. Thinks he may have been eating more salty foods lately. SOB with walking further distances. Swelling in legs and belly. His wife thinks his breathing is worse, patient says he is limited by leg pain. Denies CP, dizziness or PND/Orthopnea. Appetite ok. Weight at home 232 pounds. Taking all medications. He says someone stopped his amiodarone and [place].   ECG (personally reviewed): NSR, LBBB 148 msec  Device Interrogation (personally reviewed): Optivol index below threshold, looks to be heading bak up, thoracic impedence stable, 1-2 hrs daily activity, no VT/VF.  Labs (12/16): K 4.9, creatinine 1.23 Labs (1/17): K 4.7, creatinine 1.15, BNP 1433 Labs (2/17): K 4.7, creatinine 1.14, BNP 870 Labs (3/17): K 4.5, creatinine 1.10, BNP 1257 Labs (5/17): K 4.2, creatinine 1.2, BNP 818 Labs (7/17): LDL 149, HDL 40, TGs 210 Labs (9/17): K 4.9, creatinine 1.36 Labs (10/17): K 3.7, creatinine 1.2, LDL 87, HDL 32, LFTs normal Labs (12/17): K 4.5, creatinine 1.34, digoxin 0.3 Labs (2/18): K 4.4, creatinine 1.48, hgb 11.8 Labs (3/18): K 5 => 4.6, creatinine 1.54 => 1.46, digoxin 1.3 => 0.3 Labs (5/18): TSH normal Labs (7/18): K 3.7, creatinine 1.33, hgb 11.4, digoxin 0.6, LFTs normal Labs (8/18): K 5.1, creatinine 1.47, hgb 13.3 Labs (10/18): LDL 50, HDL 40, TGs 247 Labs (11/18): K 5.9, creatinine 1.42, hgb 15.2 Labs (12/18): K 5, creatinine 1.27, digoxin 0.6, LFTs normal, TSH normal Labs (3/19): LDL 86, HDL 45 Labs (6/19): K 4.1, creatinine 1.34, LFTs normal Labs (6/21): K 5, creatinine 1.3 Labs (8/21): K 5.1, creatinine 1.47, LDL 111, HDL 37 Labs (7/22): K. 4.0, creatinine 1.26  Past Medical History:  1. HYPERTENSION  2. HYPERLIPIDEMIA: Myalgias with  atorvastatin and Crestor 3. ECZEMA  4. RHINITIS  5. HERPES ZOSTER OPHTHALMICUS  6. ADENOCARCINOMA, PROSTATE: Status post prostatectomy in 2009. Has had some incontinence since then.  7. Diabetes mellitus type II  8. Arthritis  9. Obesity  10. GERD: rare  11. CAD: Presented with exertional dyspnea, never had chest pain. LHC (2/12) with subtotalled proximal RCA and left to right collaterals, 90% proximal moderate-sized ramus, 95% proximal relatively small CFX, 40-50% proximal LAD. Cardiac MRI (2/12) showed EF 21%, some mild scar in basal segments but all wall segments would be expected to be viable. DES x 5 (overlapping) to RCA, DES x 1 to RI 01/30/11 .  Bled into leg with Effient use (long, complicated course).  LHC (1/14): AV LCx small with subtotal occlusion, 95% ostial instent restenosis in RCA. PCI  in 2/14 to ostial RCA with 3.5 x 15 Xience DES.  Plavix allergy (hives) so put on ticlopidine.  - LHC (12/17):  the RCA was totally occluded with collaterals, there was 95% ostial to mid LAD stenosis, patient had PCI with DES to proximal-mid LAD. - LHC (7/18): Totally occluded RCA with left to right collaterals, subtotal occlusion of small AV LCx, 90% ISR proximal LAD => cutting balloon angioplasty.  12. Ischemic CMP: Echo (2/12) with moderately dilated LV, EF about 20% with diffuse hypokinesis and inferior akinesis, pseudonormal diastolic function, mild MR, severe LAE, mildly decreased RV systolic function. RHC (2/12) with mean RA 12, PA 40/25, mean PCWP 26, CI 2.1.  Echo (5/12) with EF 40% (appeared worse to my eye) with posterior HK, basal inferior AK, inferoseptal AK, basal anteroseptal AK, mild MR.  Cardiac MRI was repeated and showed EF 32% (improved from 21%) and mild LV dilation (was severely dilated before) with diffuse hypokinesis and subendocardial scar in the basal inferior, basal posterior, and basal anterolateral segments.  Echo (2/13) with EF 45%, moderate LV dilation, mild LVH.  Echo (1/14)  with EF 15%, diffuse hypokinesis, inferoposterior akinesis, mild MR, grade I diastolic dysfunction.  Echo (5/14) with EF 30-35%, mild LV dilation, akinesis of the basal inferior wall otherwise diffuse hypokinesis.  Echo (12/14) with EF 30-35%, mild LV dilation, diffuse hypokinesis with basal inferior and posterior akinesis. Echo (12/15) with EF 35%, mildly dilated LV, wall motion abnormalities, normal RV size and systolic function. Echo (12/16) with EF 20-25%, diffuse hypokinesis, severe LV dilation, mild MR.  - Echo (10/17): EF 15%, grade II diastolic dysfunction, mild MR, mildly decreased RV systolic function.  - Possible angioedema related to Entresto.  - Hyperkalemia with spironolactone 12.5 daily.  - CPX (10/17): peak VO2 10.6, VE/VCO2 slope 48.6, RER 1.12.  Severe HF limitation.  - RHC (12/17): mean RA 14, PA 53/27, mean PCWP 25, CI 1.93.  - Echo (3/18): EF 20-25%, moderate LV dilation, mild LVH, normal RV size and systolic function, mild aortic stenosis.  - Medtronic ICD 3/18.  - Echo (6/18): EF 25-30%, mild AS.  - Echo (7/19): Moderate LV dilation, EF 25-30%, diffuse hypokinesis with inferior AK, normal RV size and systolic function, mild AS.  - Echo (8/21): EF 25%, diffuse hypokinesis with moderate dilation, normal RV, IVC normal.  - CPX (8/21): peak VO2 10.2, VE/VCO2 slope 33, RER 1.0 => submaximal due to orthopedic issues, possibly mild HF limitation.  13. Cervical OA.  14. Polymyalgia rheumatica 15. OSA: Severe on sleep study 10/12.  On CPAP.  16. Left lower leg hematoma in setting of Effient use.  He developed a left gastrocnemius abscess and septic shock with prolonged hospitalization beginning in 7/13.  17. PAD: Lower extremity arterial doppler evaluation in 2/17 showed occluded peroneal arteries bilaterally, ABI 1.1 (R) and 1.0 (L).   - ABIs normal in 1/19.  18. Carotid dopplers (10/17) with minimal disease.  19. Atrial fibrillation: Paroxysmal.  20. Ventricular fibrillation  arrest: 3/18.  Medtronic ICD placed, now on amiodarone.  - VT storm 7/18, now s/p VT ablation.  21. Aortic stenosis: Mild on 6/18 echo and 7/19 echo.  22. Urothelial cancer: s/p TURBT of bladder tumor 11/18 and 6/21.    Family History:  Father died with MI at age 87. He was an alcoholic. Mother died with cancer at around 6.   Social History:  Occupation: retired Administrator  Married, lives in [place]  Past smoker, quit around [place]  of systems complete and found to be negative unless listed in HPI.      Current Outpatient Medications  Medication Sig Dispense Refill   carvedilol (COREG) 25 MG tablet Take 0.5 tablets (12.5 mg total) by mouth 2 (two) times daily with a meal. 90 tablet 3   diphenhydramine-acetaminophen (TYLENOL PM) 25-500 MG TABS tablet Take 1 tablet at bedtime as needed by mouth (for sleep).      ELIQUIS 5 MG TABS tablet TAKE 1 TABLET(5 MG) BY MOUTH TWICE DAILY 60 tablet 6   furosemide (LASIX) 20 MG tablet Take 20 mg by mouth 2 (two) times daily.     glucose blood (ACCU-CHEK AVIVA PLUS) test strip USE TO CHECK BLOOD SUGAR ONCE TO TWICE DAILY 200 strip 1   losartan (COZAAR) 25 MG tablet Take 25 mg (1 tablet) every morning and 50 mg (2 tablets) every evening. 270 tablet 3   Multiple Vitamin (MULTIVITAMIN WITH MINERALS) TABS tablet Take 1 tablet by mouth daily. Centrum Silver Adult 50+     Omega-3 Fatty Acids (FISH OIL) 1000 MG CAPS Take 1,000 mg by mouth 2 (two) times daily.      potassium chloride SA (KLOR-CON) 20 MEQ tablet Take 1 tablet (20 mEq total) by mouth as directed. 3 tablet 0   rosuvastatin (CRESTOR) 20 MG tablet Take 1 tablet (20 mg total) by mouth daily. 90 tablet 3   spironolactone (ALDACTONE) 25 MG tablet Take 0.5 tablets (12.5 mg total) by mouth daily. 30 tablet 6   tamsulosin (FLOMAX) 0.4 MG CAPS capsule Take 0.4 mg by mouth every other day. At bedtime     TRUEplus Lancets 30G MISC USE TO CHECK BLOOD SUGAR EVERY DAY TO TWICE DAILY 100 each 1    No current facility-administered medications for this encounter.   BP 134/79   Pulse 69   Wt 105 kg (231 lb 8 oz)   SpO2 94%   BMI 35.20 kg/m   Wt Readings from Last 3 Encounters:  07/19/21 105 kg (231 lb 8 oz)  06/25/21 108.5 kg (239 lb 2 oz)  03/17/21 104.3 kg (230 lb)   General:  NAD. No resp difficulty HEENT: Normal Neck: Supple. JVP 7-8 Carotids 2+ bilat; no bruits. No lymphadenopathy or thryomegaly appreciated. Cor: PMI nondisplaced. Regular rate & rhythm. No rubs, gallops or murmurs. Lungs: Clear, diminished in bases Abdomen: Obese, nontender, +distended. No hepatosplenomegaly. No bruits or masses. Good bowel sounds. Extremities: No cyanosis, clubbing, rash, 1-2+ LE edema, R>L, venous stasis RLL. Neuro: Alert & oriented x 3, cranial nerves grossly intact. Moves all 4 extremities w/o difficulty. Affect pleasant.  Assessment/Plan:  1. Chronic systolic CHF: Ischemic cardiomyopathy.  6/18 echo with EF 25-30%, diffuse hypokinesis.  CPX in 10/17 with severe HF limitation.  RHC in 12/17 with elevated filling pressures and low cardiac output.  Medtronic ICD in 3/18 after vfib arrest. He had VT again in 6/18, then VT storm in 7/18 now s/p VT ablation. Echo in 8/21 with EF 25% with normal RV (stable). CPX 8/21 was submaximal due to orthopedic issues, possible mild HF limitation.  NYHA class II-early III symptoms, also limited by orthopedic pain. He is at least mildly volume overloaded on exam. OptiVol stable. He is enrolled in monthly HF fluid device monitoring, will follow fluid trend. - Increase lasix to 40 mg bid x 2 days + 20 KCl, then back to 20 mg bid. May require higher daily lasix with holding [place]. - He had a UTI 6/22 per requested Urology notes. (  Reviewed records from 6/16 & 8/6 appointments). Will hold off re-starting Farxiga for now. - Continue Coreg 12.5 mg bid.  - Continue spironolactone 12.5 mg daily, will not increase with upper normal K.  - Continue losartan 25 mg  bid. He had angioedema with Entresto.   - LBBB on ECG with QRS 156 msec.  Have discussed with Dr. Lovena Le in the past and he has recommended to hold off CRT upgrade unless the patient has clear dyssynchrony on echo. - He may be an LVAD candidate in the future.   - Reinforced fluid restriction to < 2 L daily, sodium restriction to less than 2000 mg daily, and the importance of daily weights.   2. CAD:  S/p cutting balloon angioplasty to in-stent restenosis in proximal LAD in 7/18.  He has an occluded RCA but there were left to right collaterals.  No chest pain.  - He had been on Repatha but stopped because insurance was not covering.    - Continue Crestor 20 mg daily.  Lipid/LFTs today. - He is not on ASA given Xarelto use.  3. HLD: See discussion above.  4. PAD: Normal ABIs in 1/19. No claudication.  5. Diabetes type II: Holding SGLT2i. He is struggling with neuropathy and will be starting with PT. Personally discussed with Dr. Aundra Dubin, [place] to participate in physical therapy. 6. Atrial fibrillation: NSR today. Restart amiodarone. Unclear why he was off. - Continue Xarelto 20 mg daily. CBC today.  - Continue amiodarone 200 mg daily, except Sundays. Check LFTs/TSH today.  He will need regular eye exams.  - Would consider atrial fibrillation ablation based on [facility]-HF data if AF recurs. 7. Ventricular fibrillation arrest: On amiodarone and has Medtronic ICD.  He had VT ablation in 7/18 after episode of VT storm.  - Restart amiodarone.  8. OSA:  Not using CPAP, cannot tolerate full face mask.  He has severe OSA.   9. CKD: Stage 3.  - Labs today.   Followup with Dr. Aundra Dubin 1-2 months.  [place] FNP 07/19/2021

## 2021-07-19 ENCOUNTER — Other Ambulatory Visit: Payer: Self-pay

## 2021-07-19 ENCOUNTER — Ambulatory Visit (HOSPITAL_COMMUNITY)
Admission: RE | Admit: 2021-07-19 | Discharge: 2021-07-19 | Disposition: A | Discharge status: Home / Self Care | Payer: Medicare Other | Source: Ambulatory Visit | Attending: Family Medicine | Admitting: Family Medicine

## 2021-07-19 ENCOUNTER — Telehealth (HOSPITAL_COMMUNITY): Payer: Self-pay | Admitting: Family Medicine

## 2021-07-19 ENCOUNTER — Encounter (HOSPITAL_COMMUNITY): Payer: Self-pay

## 2021-07-19 VITALS — BP 134/79 | HR 69 | Wt 231.5 lb

## 2021-07-19 DIAGNOSIS — I469 Cardiac arrest, cause unspecified: Secondary | ICD-10-CM | POA: Diagnosis not present

## 2021-07-19 DIAGNOSIS — I480 Paroxysmal atrial fibrillation: Secondary | ICD-10-CM

## 2021-07-19 DIAGNOSIS — I739 Peripheral vascular disease, unspecified: Secondary | ICD-10-CM | POA: Diagnosis not present

## 2021-07-19 DIAGNOSIS — I4901 Ventricular fibrillation: Secondary | ICD-10-CM | POA: Diagnosis not present

## 2021-07-19 DIAGNOSIS — Z955 Presence of coronary angioplasty implant and graft: Secondary | ICD-10-CM | POA: Diagnosis not present

## 2021-07-19 DIAGNOSIS — I4891 Unspecified atrial fibrillation: Secondary | ICD-10-CM | POA: Insufficient documentation

## 2021-07-19 DIAGNOSIS — E785 Hyperlipidemia, unspecified: Secondary | ICD-10-CM

## 2021-07-19 DIAGNOSIS — I130 Hypertensive heart and chronic kidney disease with heart failure and stage 1 through stage 4 chronic kidney disease, or unspecified chronic kidney disease: Secondary | ICD-10-CM | POA: Insufficient documentation

## 2021-07-19 DIAGNOSIS — Z79899 Other long term (current) drug therapy: Secondary | ICD-10-CM | POA: Diagnosis not present

## 2021-07-19 DIAGNOSIS — Z95810 Presence of automatic (implantable) cardiac defibrillator: Secondary | ICD-10-CM | POA: Diagnosis not present

## 2021-07-19 DIAGNOSIS — I255 Ischemic cardiomyopathy: Secondary | ICD-10-CM | POA: Insufficient documentation

## 2021-07-19 DIAGNOSIS — Z9079 Acquired absence of other genital organ(s): Secondary | ICD-10-CM | POA: Insufficient documentation

## 2021-07-19 DIAGNOSIS — E1122 Type 2 diabetes mellitus with diabetic chronic kidney disease: Secondary | ICD-10-CM | POA: Diagnosis not present

## 2021-07-19 DIAGNOSIS — N1831 Chronic kidney disease, stage 3a: Secondary | ICD-10-CM

## 2021-07-19 DIAGNOSIS — I447 Left bundle-branch block, unspecified: Secondary | ICD-10-CM | POA: Insufficient documentation

## 2021-07-19 DIAGNOSIS — N183 Chronic kidney disease, stage 3 unspecified: Secondary | ICD-10-CM | POA: Diagnosis not present

## 2021-07-19 DIAGNOSIS — I5022 Chronic systolic (congestive) heart failure: Secondary | ICD-10-CM

## 2021-07-19 DIAGNOSIS — G4733 Obstructive sleep apnea (adult) (pediatric): Secondary | ICD-10-CM

## 2021-07-19 DIAGNOSIS — I2510 Atherosclerotic heart disease of native coronary artery without angina pectoris: Secondary | ICD-10-CM | POA: Diagnosis not present

## 2021-07-19 DIAGNOSIS — N35012 Post-traumatic membranous urethral stricture: Secondary | ICD-10-CM | POA: Diagnosis not present

## 2021-07-19 DIAGNOSIS — Z7901 Long term (current) use of anticoagulants: Secondary | ICD-10-CM | POA: Insufficient documentation

## 2021-07-19 DIAGNOSIS — E1140 Type 2 diabetes mellitus with diabetic neuropathy, unspecified: Secondary | ICD-10-CM | POA: Insufficient documentation

## 2021-07-19 DIAGNOSIS — Z87891 Personal history of nicotine dependence: Secondary | ICD-10-CM | POA: Diagnosis not present

## 2021-07-19 DIAGNOSIS — Z9114 Patient's other noncompliance with medication regimen: Secondary | ICD-10-CM | POA: Insufficient documentation

## 2021-07-19 LAB — COMPREHENSIVE METABOLIC PANEL WITH GFR
ALT: 25 U/L (ref 0–44)
AST: 23 U/L (ref 15–41)
Albumin: 3.5 g/dL (ref 3.5–5.0)
Alkaline Phosphatase: 82 U/L (ref 38–126)
Anion gap: 7 (ref 5–15)
BUN: 18 mg/dL (ref 8–23)
CO2: 29 mmol/L (ref 22–32)
Calcium: 9.4 mg/dL (ref 8.9–10.3)
Chloride: 104 mmol/L (ref 98–111)
Creatinine, Ser: 1.31 mg/dL — ABNORMAL HIGH (ref 0.61–1.24)
GFR, Estimated: 56 mL/min — ABNORMAL LOW
Glucose, Bld: 257 mg/dL — ABNORMAL HIGH (ref 70–99)
Potassium: 4.9 mmol/L (ref 3.5–5.1)
Sodium: 140 mmol/L (ref 135–145)
Total Bilirubin: 1.2 mg/dL (ref 0.3–1.2)
Total Protein: 6.3 g/dL — ABNORMAL LOW (ref 6.5–8.1)

## 2021-07-19 LAB — CBC
HCT: 50.6 % (ref 39.0–52.0)
Hemoglobin: 16.1 g/dL (ref 13.0–17.0)
MCH: 29.2 pg (ref 26.0–34.0)
MCHC: 31.8 g/dL (ref 30.0–36.0)
MCV: 91.7 fL (ref 80.0–100.0)
Platelets: 146 10*3/uL — ABNORMAL LOW (ref 150–400)
RBC: 5.52 MIL/uL (ref 4.22–5.81)
RDW: 13 % (ref 11.5–15.5)
WBC: 5.8 10*3/uL (ref 4.0–10.5)
nRBC: 0 % (ref 0.0–0.2)

## 2021-07-19 MED ORDER — AMIODARONE HCL 200 MG PO TABS
200.0000 mg | ORAL_TABLET | Freq: Every day | ORAL | 3 refills | Status: DC
Start: 2021-07-19 — End: 2021-08-16

## 2021-07-19 MED ORDER — POTASSIUM CHLORIDE CRYS ER 20 MEQ PO TBCR: 20.0000 meq | EXTENDED_RELEASE_TABLET | ORAL | 0 refills | Status: DC

## 2021-07-19 MED ORDER — DAPAGLIFLOZIN PROPANEDIOL 10 MG PO TABS: 10.0000 mg | ORAL_TABLET | Freq: Every day | ORAL | 6 refills | Status: DC

## 2021-07-19 MED ORDER — LOSARTAN POTASSIUM 25 MG PO TABS
25.0000 mg | ORAL_TABLET | Freq: Two times a day (BID) | ORAL | Status: DC
Start: 2021-07-19 — End: 2021-08-16

## 2021-07-19 NOTE — Telephone Encounter (Signed)
Called patient to discuss labs and medication changes. LMTCB. He will need to stop [place] until we receive office notes from Urology visit today.  Allena Katz, FNP-BC

## 2021-07-19 NOTE — Patient Instructions (Signed)
Restart Amiodarone 200 mg Daily EXCEPT DO NOT TAKE ANY ON Sunday  Restart Farxiga 10 mg Daily  Take Losartan 25 mg (1 tab) Twice daily   Increase Furosemide to 40 mg (2 tabs) Twice daily FOR 2 DAYS ONLY, then back to 20 mg Twice daily   Take Potassium 20 meq (1 tab) Daily FOR 2 DAYS ONLY  Labs done today, we will call you for abnormal results  Your physician recommends that you return for lab work in: 7-10 days  Your physician recommends that you schedule a follow-up appointment in: 2 months  Do the following things EVERYDAY: Weigh yourself in the morning before breakfast. Write it down and keep it in a log. Take your medicines as prescribed Eat low salt foods--Limit salt (sodium) to 2000 mg per day.  Stay as active as you can everyday Limit all fluids for the day to less than 2 liters  Restrict your sodium intake to less than '2000mg'$  per day. This will help prevent your body from holding onto fluid. Read food labels as a lot of canned and packaged foods have a lot of sodium.  Limit your fluid intake to less than 2 liters of fluid per day. Fluid includes all drinks, coffee, juice, ice chips, soup, jello, and all other liquids.  If you have any questions or concerns before your next appointment please send [place] a message through [place] or call our office at [phone].    TO LEAVE A MESSAGE FOR THE NURSE SELECT OPTION 2, PLEASE LEAVE A MESSAGE INCLUDING: YOUR NAME DATE OF BIRTH CALL BACK NUMBER REASON FOR CALL**this is important as we prioritize the call backs  YOU WILL RECEIVE A CALL BACK THE SAME DAY AS LONG AS YOU CALL BEFORE 4:00 PM  milAt the [facility], you and your health needs are our priority. As part of our continuing mission to provide you with exceptional heart care, we have created designated Provider Care Teams. These Care Teams include your primary Cardiologist (physician) and Advanced Practice Providers (APPs- Physician Assistants and Nurse  Practitioners) who all work together to provide you with the care you need, when you need it.   You may see any of the following providers on your designated Care Team at your next follow up: Dr Glori Bickers Dr Loralie Champagne Dr Patrice Paradise, NP Lyda Jester, [place] Ginnie Smart Audry Riles, PharmD   Please be sure to bring in all your medications bottles to every appointment.

## 2021-07-22 ENCOUNTER — Ambulatory Visit (INDEPENDENT_AMBULATORY_CARE_PROVIDER_SITE_OTHER): Payer: Medicare Other

## 2021-07-22 DIAGNOSIS — Z95810 Presence of automatic (implantable) cardiac defibrillator: Secondary | ICD-10-CM | POA: Diagnosis not present

## 2021-07-22 DIAGNOSIS — I5022 Chronic systolic (congestive) heart failure: Secondary | ICD-10-CM | POA: Diagnosis not present

## 2021-07-22 NOTE — Progress Notes (Signed)
EPIC Encounter for ICM Monitoring  Patient Name: Wayne [name] is a 80 y.o. male Date: 07/22/2021 Primary Care Physican: [name], MD Primary Cardiologist: Aundra Dubin Electrophysiologist: Lovena Le 06/25/2021 Weight: 239 lbs 07/21/2021 Weight:  229-231 lbs (baseline)         Spoke with patient.  Heart Failure questions reviewed.  Pt asymptomatic for fluid today after HF clinic instructed him to double last week in HF clinic.          His fluid symptoms were SOB, abdominal bloating and feeling sluggish but extra Furosemide resolved symptoms.   Optivol thoracic impedance suggesting fluid levels returned to normal after HF doubled Lasix x 2 days last week.    Prescribed: Furosemide 20 mg take 1 tablet (20 mg total) by mouth twice a day. Potassium 20 mEq  take 1 tablet (20 mEq total) by mouth daily. Spironolactone 25 mg take 0.5 tablet (12.5 mg total) daily   Labs: 07/19/2021 Creatinine 1.31, BUN 18, Potassium 4.9, Sodium 140, GFR 56 07/03/2021 Creatinine 1.26, BUN 22, Potassium 4.0, Sodium 139, GFR 59 06/25/2021 Creatinine 1.20, BUN 15, Potassium 4.5, Sodium 140, GFR >60 05/11/2021 Creatinine 1.16, BUN 20, Potassium 4.6, Sodium 138, GFR >60  A complete set of results can be found in Results Review.   Recommendations:  Encouraged to call if experiencing fluid symptoms.   Follow-up plan: ICM clinic phone appointment on 08/22/2021.   91 day device clinic remote transmission 09/12/2021.     EP/Cardiology Office Visits: 07/19/2021 with HF clinic PA/NP.     Copy of ICM check sent to Dr. Lovena Le.   3 month ICM trend: 07/22/2021.    1 Year ICM trend:       Rosalene Billings, RN 07/22/2021 11:03 AM

## 2021-07-23 ENCOUNTER — Telehealth: Payer: Self-pay

## 2021-07-23 NOTE — Telephone Encounter (Signed)
Returned patient call per voice mail request regarding Wilder Glade.  Pt wanted to confirm he understood the instructions from [facility] phone call to hold Farxiga at this time.  Advised the 8/5 phone call note states to hold Farxiga until the HF clinic receives notes from his urology visit.    Advised will send to Janett Billow and if there are any new instructions will call him back.    Otherwise he should hold Wilder Glade until someone from HF clinic calls him with new instructions.  He verbalized understanding.

## 2021-07-23 NOTE — Addendum Note (Signed)
Encounter addended by: Rafael Bihari, FNP on: 07/23/2021 4:23 PM  Actions taken: Clinical Note Signed

## 2021-07-24 NOTE — Telephone Encounter (Signed)
Message Received: Alvino Chapel, Maricela Bo, FNP  Marco Adelson Panda, RN Caller: Unspecified (Yesterday,  9:14 AM) Yes that is correct. I am going to contact his Urologist today to see if I can get his last clinic note. Thank you Margarita Grizzle :)

## 2021-07-25 ENCOUNTER — Telehealth (HOSPITAL_COMMUNITY): Payer: Self-pay

## 2021-07-25 NOTE — Telephone Encounter (Signed)
Patients wife called requesting that patient have physical therapy. Ok to order?

## 2021-07-26 ENCOUNTER — Other Ambulatory Visit: Payer: Self-pay | Admitting: Urology

## 2021-07-30 ENCOUNTER — Ambulatory Visit (HOSPITAL_COMMUNITY)
Admission: RE | Admit: 2021-07-30 | Discharge: 2021-07-30 | Disposition: A | Discharge status: Home / Self Care | Payer: Medicare Other | Source: Ambulatory Visit | Attending: Cardiology | Admitting: Cardiology

## 2021-07-30 ENCOUNTER — Other Ambulatory Visit: Payer: Self-pay

## 2021-07-30 ENCOUNTER — Other Ambulatory Visit (HOSPITAL_COMMUNITY): Payer: Self-pay | Admitting: Family Medicine

## 2021-07-30 DIAGNOSIS — I10 Essential (primary) hypertension: Secondary | ICD-10-CM | POA: Diagnosis not present

## 2021-07-30 LAB — BASIC METABOLIC PANEL
Anion gap: 10 (ref 5–15)
BUN: 20 mg/dL (ref 8–23)
CO2: 26 mmol/L (ref 22–32)
Calcium: 8.9 mg/dL (ref 8.9–10.3)
Chloride: 102 mmol/L (ref 98–111)
Creatinine, Ser: 1.31 mg/dL — ABNORMAL HIGH (ref 0.61–1.24)
GFR, Estimated: 56 mL/min — ABNORMAL LOW (ref >60)
Glucose, Bld: 254 mg/dL — ABNORMAL HIGH (ref 70–99)
Potassium: 4.6 mmol/L (ref 3.5–5.1)
Sodium: 138 mmol/L (ref 135–145)

## 2021-07-31 ENCOUNTER — Other Ambulatory Visit (HOSPITAL_COMMUNITY): Payer: Self-pay

## 2021-07-31 DIAGNOSIS — I5022 Chronic systolic (congestive) heart failure: Secondary | ICD-10-CM

## 2021-07-31 NOTE — Progress Notes (Signed)
Orders Placed This Encounter  Procedures   Ambulatory referral to Home Health    Referral Priority:   Routine    Referral Type:   Home Health Care    Referral Reason:   Specialty Services Required    Requested Specialty:   Home Health Services    Number of Visits Requested:   1    

## 2021-07-31 NOTE — Telephone Encounter (Signed)
Referral placed.

## 2021-08-02 ENCOUNTER — Encounter: Payer: Self-pay | Admitting: Internal Medicine

## 2021-08-02 NOTE — Progress Notes (Addendum)
COVID swab appointment:  N/A  COVID Vaccine Completed:  Yes x2 Date COVID Vaccine completed: Has received booster: Yes x1 COVID vaccine manufacturer:   [employer]   Date of COVID positive in last 90 days:  No  PCP - Carolann Littler, MD Cardiologist - Loralie Champagne, MD  Chest x-ray - N/A EKG - 07-19-21 Epic Stress Test - 08-13-20 Epic ECHO - 07-19-20 Epic Cardiac Cath - 07-06-17 Epic Pacemaker/ICD device last checked: 03-14-21 Epic. Device orders in Epic Spinal Cord Stimulator: Cardiac MRI - 11-14-16 Epic  Sleep Study - 2012, +sleep apnea CPAP - No  Fasting Blood Sugar - Does not check Checks Blood Sugar _____ times a day  Blood Thinner Instructions:  Eliquis 5 mg.  Per patient to stop 3 days preop.  Last dose 08-05-21  Aspirin Instructions: Last Dose:  Activity level:  Unable to go up a flight of stairs due to neuropathy.  Able to perform ADLs independently.   States that when he has fluid overload, he can have some shortness of breath.    Anesthesia review:  CAD, ICD, CHF, Afib, Hx of cardiac arrest and MI, cardiomyopathy,  LBBB,  aortic stenosis, V tach, HTN, DM, severe OSA, CKD  CBG 339 at PAT.  A1c 9.8  Patient denies shortness of breath, fever, cough and chest pain at PAT appointment   Patient verbalized understanding of instructions that were given to them at the PAT appointment. Patient was also instructed that they will need to review over the PAT instructions again at home before surgery.

## 2021-08-02 NOTE — Progress Notes (Signed)
PERIOPERATIVE PRESCRIPTION FOR IMPLANTED CARDIAC DEVICE PROGRAMMING  Patient Information: Name:  Wayne [name]  DOB:  1941/11/12  MRN:  [id number]    Blenda Peals, RN  P Cv Div Heartcare Device Planned Procedure: Cystoscopy with urethral dilation  Surgeon:  Festus Aloe  Date of Procedure:  08-09-21  Cautery will be used.  Position during surgery:  Supine   Please send documentation back to:  [facility] (Fax # [phone])   Lambert Keto, RN  08/02/2021 9:44 AM  Device Information:  Clinic EP Physician:  [name], MD   Device Type:  Defibrillator Manufacturer and Phone #:  Medtronic: [phone] Pacemaker Dependent?:  No. Date of Last Device Check:  07/22/21 Normal Device Function?:  Yes.    Electrophysiologist's Recommendations:  Have magnet available. Provide continuous ECG monitoring when magnet is used or reprogramming is to be performed.  Procedure may interfere with device function.  Magnet should be placed over device during procedure.  Per Device Clinic Standing Orders, York Ram, RN  12:32 PM 08/02/2021

## 2021-08-02 NOTE — Patient Instructions (Addendum)
DUE TO COVID-19 ONLY ONE VISITOR IS ALLOWED TO COME WITH YOU AND STAY IN THE WAITING ROOM ONLY DURING PRE OP AND PROCEDURE.   **NO VISITORS ARE ALLOWED IN THE SHORT STAY AREA OR RECOVERY ROOM!!**         Your procedure is scheduled on: Friday, 08-09-21   Report to [facility] Main  Entrance    Report to admitting at 10:30 AM   Call this number if you have problems the morning of surgery [phone]   Do not eat food :After Midnight.   May have liquids until 9:45 AM  day of surgery  CLEAR LIQUID DIET  Foods Allowed                                                                     Foods Excluded  Water, Black Coffee (no milk/no creamer) and tea, regular and decaf               liquids that you cannot  Plain Jell-O in any flavor  (No red)                                  see through such as: Fruit ices (not with fruit pulp)                                      milk, soups, orange juice              Iced Popsicles (No red)                                      All solid food                                   Apple juices Sports drinks like Gatorade (No red) Lightly seasoned clear broth or consume(fat free) Sugar       Oral Hygiene is also important to reduce your risk of infection.                                    Remember - BRUSH YOUR TEETH THE MORNING OF SURGERY WITH YOUR REGULAR TOOTHPASTE   Do NOT smoke after Midnight   Take these medicines the morning of surgery with A SIP OF WATER:  Amiodarone, Carvedilol, Rosuvastatin                               You may not have any metal on your body including  jewelry, and body piercing             Do not wear lotions, powders, cologne, or deodorant               Men may shave face and neck.   Do not bring valuables to the hospital. [facility]  NOT RESPONSIBLE   FOR VALUABLES.   Contacts, dentures or bridgework may not be worn into surgery.   Patients discharged the day of surgery will not be allowed to drive  home.  Special Instructions: Bring a copy of your healthcare power of attorney and living will documents         the day of surgery if you haven't scanned them in before.  Please read over the following fact sheets you were given: IF YOU HAVE QUESTIONS ABOUT YOUR PRE OP INSTRUCTIONS PLEASE CALL [facility] - Preparing for Surgery Before surgery, you can play an important role.  Because skin is not sterile, your skin needs to be as free of germs as possible.  You can reduce the number of germs on your skin by washing with CHG (chlorahexidine gluconate) soap before surgery.  CHG is an antiseptic cleaner which kills germs and bonds with the skin to continue killing germs even after washing. Please DO NOT use if you have an allergy to CHG or antibacterial soaps.  If your skin becomes reddened/irritated stop using the CHG and inform your nurse when you arrive at Short Stay. Do not shave (including legs and underarms) for at least 48 hours prior to the first CHG shower.  You may shave your face/neck.  Please follow these instructions carefully:  1.  Shower with CHG Soap the night before surgery and the  morning of surgery.  2.  If you choose to wash your hair, wash your hair first as usual with your normal  shampoo.  3.  After you shampoo, rinse your hair and body thoroughly to remove the shampoo.                             4.  Use CHG as you would any other liquid soap.  You can apply chg directly to the skin and wash.  Gently with a scrungie or clean washcloth.  5.  Apply the CHG Soap to your body ONLY FROM THE NECK DOWN.   Do   not use on face/ open                           Wound or open sores. Avoid contact with eyes, ears mouth and   genitals (private parts).                       Wash face,  Genitals (private parts) with your normal soap.             6.  Wash thoroughly, paying special attention to the area where your    surgery  will be performed.  7.  Thoroughly rinse your  body with warm water from the neck down.  8.  DO NOT shower/wash with your normal soap after using and rinsing off the CHG Soap.                9.  Pat yourself dry with a clean towel.            10.  Wear clean pajamas.            11.  Place clean sheets on your bed the night of your first shower and do not  sleep with pets. Day of Surgery : Do not apply any lotions/deodorants the morning of surgery.  Please wear clean clothes to the hospital/surgery  center.  FAILURE TO FOLLOW THESE INSTRUCTIONS MAY RESULT IN THE CANCELLATION OF YOUR SURGERY  PATIENT SIGNATURE_________________________________  NURSE SIGNATURE__________________________________  ________________________________________________________________________

## 2021-08-06 ENCOUNTER — Encounter (HOSPITAL_COMMUNITY): Payer: Self-pay

## 2021-08-06 ENCOUNTER — Encounter (HOSPITAL_COMMUNITY)
Admission: RE | Admit: 2021-08-06 | Discharge: 2021-08-06 | Disposition: A | Discharge status: Home / Self Care | Payer: Medicare Other | Source: Ambulatory Visit | Attending: Urology | Admitting: Urology

## 2021-08-06 ENCOUNTER — Other Ambulatory Visit: Payer: Self-pay

## 2021-08-06 ENCOUNTER — Encounter (HOSPITAL_COMMUNITY): Payer: Self-pay | Admitting: *Deleted

## 2021-08-06 DIAGNOSIS — N35919 Unspecified urethral stricture, male, unspecified site: Secondary | ICD-10-CM | POA: Diagnosis not present

## 2021-08-06 DIAGNOSIS — I130 Hypertensive heart and chronic kidney disease with heart failure and stage 1 through stage 4 chronic kidney disease, or unspecified chronic kidney disease: Secondary | ICD-10-CM | POA: Insufficient documentation

## 2021-08-06 DIAGNOSIS — G4733 Obstructive sleep apnea (adult) (pediatric): Secondary | ICD-10-CM | POA: Diagnosis not present

## 2021-08-06 DIAGNOSIS — Z955 Presence of coronary angioplasty implant and graft: Secondary | ICD-10-CM | POA: Insufficient documentation

## 2021-08-06 DIAGNOSIS — Z8546 Personal history of malignant neoplasm of prostate: Secondary | ICD-10-CM | POA: Insufficient documentation

## 2021-08-06 DIAGNOSIS — I480 Paroxysmal atrial fibrillation: Secondary | ICD-10-CM | POA: Insufficient documentation

## 2021-08-06 DIAGNOSIS — Z8551 Personal history of malignant neoplasm of bladder: Secondary | ICD-10-CM | POA: Insufficient documentation

## 2021-08-06 DIAGNOSIS — E1122 Type 2 diabetes mellitus with diabetic chronic kidney disease: Secondary | ICD-10-CM | POA: Diagnosis not present

## 2021-08-06 DIAGNOSIS — Z95810 Presence of automatic (implantable) cardiac defibrillator: Secondary | ICD-10-CM | POA: Insufficient documentation

## 2021-08-06 DIAGNOSIS — I2510 Atherosclerotic heart disease of native coronary artery without angina pectoris: Secondary | ICD-10-CM | POA: Insufficient documentation

## 2021-08-06 DIAGNOSIS — Z01812 Encounter for preprocedural laboratory examination: Secondary | ICD-10-CM | POA: Diagnosis not present

## 2021-08-06 DIAGNOSIS — Z79899 Other long term (current) drug therapy: Secondary | ICD-10-CM | POA: Insufficient documentation

## 2021-08-06 DIAGNOSIS — I255 Ischemic cardiomyopathy: Secondary | ICD-10-CM | POA: Diagnosis not present

## 2021-08-06 DIAGNOSIS — N183 Chronic kidney disease, stage 3 unspecified: Secondary | ICD-10-CM | POA: Diagnosis not present

## 2021-08-06 DIAGNOSIS — Z7901 Long term (current) use of anticoagulants: Secondary | ICD-10-CM | POA: Diagnosis not present

## 2021-08-06 HISTORY — DX: Malignant neoplasm of prostate: C61

## 2021-08-06 HISTORY — DX: Malignant neoplasm of bladder, unspecified: C67.9

## 2021-08-06 LAB — HEMOGLOBIN A1C
Hgb A1c MFr Bld: 9.8 % — ABNORMAL HIGH (ref 4.8–5.6)
Mean Plasma Glucose: 234.56 mg/dL

## 2021-08-06 LAB — GLUCOSE, CAPILLARY: Glucose-Capillary: 339 mg/dL — ABNORMAL HIGH (ref 70–99)

## 2021-08-06 NOTE — Progress Notes (Signed)
Received surg clearance request from [facility], pt needs clearance for urethral dilation and to hold Eliquis  Per Dr Aundra Dubin: "ok for surgery with plan as above to hold Eliquis 3 days prior, moderate risk"  Note faxed back to them at [phone]

## 2021-08-07 NOTE — Progress Notes (Addendum)
Anesthesia Chart Review   Case: [id number] Date/Time: 08/09/21 1230   Procedure: CYSTOSCOPY WITH URETHRAL DILATATION WITH OPTILUME   Anesthesia type: General   Pre-op diagnosis: URETHRAL STRICTURE   Location: [facility]   Surgeons: Festus Aloe, MD       DISCUSSION:79 y.o. former smoker with h/o HTN, ischemic cardiomyopathy, AICD in place (device orders in progress note 08/02/2021), CAD (DES), PAF, chronic LBBB, mild aortic stenosis (Mean gradient (S): 9 mm Hg. Valve area (VTI): 1.66 cm^2 on Echo 07/2020), OSA, DM II, CKD Stage III, bladder cancer, prostate cancer, urethral stricture scheduled for above procedure 08/09/2021 with Dr. Genevie Ann.   Per Dr Aundra Dubin (cardiologist): "ok for surgery with plan as above to hold Eliquis 3 days prior, moderate risk"  Poorly controlled diabetes.  CBG 339.  Discussed importance of better diabetes management and risk of cancellation DOS.  When seen in PAT prior to last procedure 05/2020 this was also discussed.  Evaluate DOS. VS: BP 119/61   Pulse 65   Temp 36.9 C (Oral)   Resp 18   Ht '5\' 8"'$  (1.727 m)   Wt 104.3 kg   SpO2 97%   BMI 34.97 kg/m   PROVIDERS: [name], MD is PCP   Loralie Champagne, MD is Cardiologist  LABS:  forwarded to surgeon and PCP  (all labs ordered are listed, but only abnormal results are displayed)  Labs Reviewed  HEMOGLOBIN A1C - Abnormal; Notable for the following components:      Result Value   Hgb A1c MFr Bld 9.8 (*)    All other components within normal limits  GLUCOSE, CAPILLARY - Abnormal; Notable for the following components:   Glucose-Capillary 339 (*)    All other components within normal limits     IMAGES:   EKG: 07/19/2021 Rate 67 bpm  Normal sinus rhythm Left bundle branch block Abnormal ECG No significant change since last tracing  CV: Echo 07/19/2020   1. Left ventricular ejection fraction, by estimation, is 25%. The left  ventricle has severely decreased  function. The left ventricle demonstrates  global hypokinesis. The left ventricular internal cavity size was  moderately dilated. Left ventricular  diastolic parameters are consistent with Grade I diastolic dysfunction  (impaired relaxation).   2. Right ventricular systolic function is normal. The right ventricular  size is normal. Tricuspid regurgitation signal is inadequate for assessing  PA pressure.   3. Left atrial size was moderately dilated.   4. The mitral valve is normal in structure. No evidence of mitral valve  regurgitation. No evidence of mitral stenosis.   5. The aortic valve is tricuspid. Aortic valve regurgitation is not  visualized. Mild to moderate aortic valve stenosis. Aortic valve mean  gradient measures 11.0 mmHg.   6. The inferior vena cava is normal in size with greater than 50%  respiratory variability, suggesting right atrial pressure of 3 mmHg. Past Medical History:  Diagnosis Date   AICD (automatic cardioverter/defibrillator) present    Arthritis    Bladder cancer ([place])    Cancer ([place])    bladder and prostate   Cellulitis of left leg    a. 0000000 complicated by septic shock   Chronic systolic CHF (congestive heart failure) (HCC)    CKD (chronic kidney disease), stage III ([place])    pt denies   Colon polyps    CORONARY ATHEROSCLEROSIS NATIVE CORONARY ARTERY cardiologist-  dr Aundra Dubin   a. 01/2011 Cath/PCI: LM nl, LAD 40-50p, D1 80-small, LCX 95-small,  RI 90, RCA 100, EF 20%;  b. 01/2011 Card MRI - No transmural scar;  c. 01/2011 PCI RCA->5 Promus DES, RI->3.0x16 Promus DES; d. Cath 01/13/13 patent LAD & Ramus, diffuse LCx dz, RCA mult overlapping stents w/ 95% osital stenosis, EF 20%, s/p DES to ostial/prox RCA 01/24/13    Diabetes mellitus, type 2 ([place])    type 2   Hematoma of leg    a. left leg hematoma 03/2012 in the setting of asa/effient   Herpes zoster ophthalmicus    neuropathy   History of kidney stones    History of non-ST elevation myocardial  infarction (NSTEMI) 06/2012   History of prostate cancer    dx 2009---  s/p radioactive seed implants 11-24-2008   HYPERLIPIDEMIA    intolerant to Lipitor (myalgias)   HYPERTENSION    Ischemic cardiomyopathy    a. 01/2012 Echo EF 45%, mild LVH; b. [id number], grade 1 diastolic dysfunction, diffuse hypokinesis, inferoposterior akinesis    Myocardial infarction ([place])    x2   Noncompliance    Obesity    OSA (obstructive sleep apnea)    dx 2012  severe osa  no cpap just uses nose strips   PAF (paroxysmal atrial fibrillation) ([place])    first dx 01-16-2017   Polymyalgia rheumatica ([place])    Prostate cancer ([place])    S/P drug eluting coronary stent placement    total 6 DES    Past Surgical History:  Procedure Laterality Date   CARDIAC CATHETERIZATION  01/24/2013   CARDIAC CATHETERIZATION N/A 11/14/2016   Procedure: Right/Left Heart Cath and Coronary Angiography;  Surgeon: Larey Dresser, MD;  Location: [facility];  Service: Cardiovascular;  Laterality: N/A;   CARDIAC CATHETERIZATION N/A 11/17/2016   Procedure: Coronary Stent Intervention w/Impella;  Surgeon: Peter M Martinique, MD;  Location: [facility];  Service: Cardiovascular;  Laterality: N/A;   CARDIAC CATHETERIZATION N/A 11/17/2016   Procedure: Coronary Atherectomy;  Surgeon: Peter M Martinique, MD;  Location: [facility];  Service: Cardiovascular;  Laterality: N/A;   CORONARY ANGIOPLASTY WITH STENT PLACEMENT  01-30-2011   dr Aundra Dubin   PTCA and multiple overlapping DEStenting to RCA and 1 DES to RI    CORONARY BALLOON ANGIOPLASTY N/A 07/06/2017   Procedure: Coronary Balloon Angioplasty;  Surgeon: Sherren Mocha, MD;  Location: [facility];  Service: Cardiovascular;  Laterality: N/A;   CYSTOSCOPY WITH FULGERATION N/A 05/25/2018   Procedure: CYSTOSCOPY WITH FULGERATION/BLADDER BIOPSY , 15 mm lesion;  Surgeon: Festus Aloe, MD;  Location[facility];  Service: Urology;  Laterality: N/A;   I & D EXTREMITY  06/15/2012    Procedure: IRRIGATION AND DEBRIDEMENT EXTREMITY;  Surgeon: Newt Minion, MD;  Location: [facility];  Service: Orthopedics;  Laterality: Left;  I&D Left Posterior Knee   I & D EXTREMITY  06/30/2012   Procedure: IRRIGATION AND DEBRIDEMENT EXTREMITY;  Surgeon: Newt Minion, MD;  Location: [facility];  Service: Orthopedics;  Laterality: Left;  Left Leg Irrigation and Debridement and placement of Wound VAC and application of  A-cell   I & D EXTREMITY  07/20/2012   Procedure: IRRIGATION AND DEBRIDEMENT EXTREMITY;  Surgeon: Newt Minion, MD;  Location: [facility];  Service: Orthopedics;  Laterality: Left;  Irrigation and Debridement Left Leg and Place antibiotic beads    ICD IMPLANT N/A 02/12/2017   Procedure: ICD Implant;  Surgeon: Evans Lance, MD;  Location: [facility];  Service: Cardiovascular;  Laterality: N/A;   LAPAROSCOPIC  INGUINAL HERNIA REPAIR Right 09-11-2008   dr Barry Dienes  [facility]   LEAD REVISION/REPAIR N/A 05/26/2017   Procedure: Lead Revision/Repair;  Surgeon: Evans Lance, MD;  Location: [facility];  Service: Cardiovascular;  Laterality: N/A;   LEFT HEART CATH AND CORONARY ANGIOGRAPHY N/A 07/06/2017   Procedure: Left Heart Cath and Coronary Angiography;  Surgeon: Larey Dresser, MD;  Location: [facility];  Service: Cardiovascular;  Laterality: N/A;   PERCUTANEOUS CORONARY STENT INTERVENTION (PCI-S) N/A 01/24/2013   Procedure: PERCUTANEOUS CORONARY STENT INTERVENTION (PCI-S);  Surgeon: Wellington Hampshire, MD;  Location: [facility] CATH LAB;  Service: Cardiovascular;  Laterality: N/A;   RADIOACTIVE PROSTATE SEED IMPLANTS  11-24-2008   dr Tresa Endo  [place] TUMOR N/A 10/30/2017   Procedure: TRANSURETHRAL RESECTION OF BLADDER TUMOR (TURBT)/ INSTILLATION OF EPIRUBICIN;  Surgeon: Festus Aloe, MD;  [facility];  Service: Urology;  Laterality: N/A;   TRANSURETHRAL RESECTION OF BLADDER TUMOR N/A 05/18/2020   Procedure: TRANSURETHRAL RESECTION OF BLADDER  TUMOR (TURBT) 0.5-2.0 cm BILATERAL RETROGRADE PYELOGRAM;  Surgeon: Festus Aloe, MD;  [facility];  Service: Urology;  Laterality: N/A;   ULTRASOUND GUIDANCE FOR VASCULAR ACCESS  11/14/2016   Procedure: Ultrasound Guidance For Vascular Access;  Surgeon: Larey Dresser, MD;  Location: [facility];  Service: Cardiovascular;;   V TACH ABLATION N/A 06/19/2017   Procedure: V Tach Ablation;  Surgeon: Evans Lance, MD;  Location: [facility];  Service: Cardiovascular;  Laterality: N/A;    MEDICATIONS:  amiodarone (PACERONE) 200 MG tablet   carvedilol (COREG) 25 MG tablet   dapagliflozin propanediol (FARXIGA) 10 MG TABS tablet   diphenhydramine-acetaminophen (TYLENOL PM) 25-500 MG TABS tablet   ELIQUIS 5 MG TABS tablet   furosemide (LASIX) 20 MG tablet   glucose blood (ACCU-CHEK AVIVA PLUS) test strip   losartan (COZAAR) 25 MG tablet   Multiple Vitamin (MULTIVITAMIN WITH MINERALS) TABS tablet   Omega-3 Fatty Acids (FISH OIL) 1000 MG CAPS   potassium chloride SA (KLOR-CON) 20 MEQ tablet   rosuvastatin (CRESTOR) 20 MG tablet   spironolactone (ALDACTONE) 25 MG tablet   tamsulosin (FLOMAX) 0.4 MG CAPS capsule   TRUEplus Lancets 30G MISC   No current facility-administered medications for this encounter.    Konrad Felix Ward, PA-C [facility] [phone]

## 2021-08-07 NOTE — Anesthesia Preprocedure Evaluation (Addendum)
Anesthesia Evaluation  Patient identified by MRN, date of birth, ID band Patient awake    Reviewed: Allergy & Precautions, NPO status , Patient's Chart, lab work & pertinent test results  History of Anesthesia Complications Negative for: history of anesthetic complications  Airway Mallampati: I  TM Distance: >3 FB Neck ROM: Full    Dental  (+) Dental Advisory Given, Edentulous Upper, Edentulous Lower   Pulmonary sleep apnea , former smoker,    Pulmonary exam normal        Cardiovascular hypertension, + CAD, + Past MI, + Cardiac Stents, + Peripheral Vascular Disease and +CHF  Normal cardiovascular exam+ dysrhythmias Atrial Fibrillation + Cardiac Defibrillator + Valvular Problems/Murmurs AS   Echo 07/19/2020 1. Left ventricular ejection fraction, by estimation, is 25%. The left  ventricle has severely decreased function. The left ventricle demonstrates  global hypokinesis. The left ventricular internal cavity size was  moderately dilated. Left ventricular  diastolic parameters are consistent with Grade I diastolic dysfunction  (impaired relaxation).  2. Right ventricular systolic function is normal. The right ventricular  size is normal. Tricuspid regurgitation signal is inadequate for assessing  PA pressure.  3. Left atrial size was moderately dilated.  4. The mitral valve is normal in structure. No evidence of mitral valve  regurgitation. No evidence of mitral stenosis.  5. The aortic valve is tricuspid. Aortic valve regurgitation is not  visualized. Mild to moderate aortic valve stenosis. Aortic valve mean  gradient measures 11.0 mmHg.  6. The inferior vena cava is normal in size with greater than 50%  respiratory variability, suggesting right atrial pressure of 3 mmHg.   Neuro/Psych negative neurological ROS     GI/Hepatic negative GI ROS, Neg liver ROS,   Endo/Other  diabetes, Type 2  Renal/GU Renal  InsufficiencyRenal disease  negative genitourinary   Musculoskeletal  (+) Arthritis ,   Abdominal   Peds  Hematology negative hematology ROS (+)   Anesthesia Other Findings   Reproductive/Obstetrics                            Anesthesia Physical Anesthesia Plan  ASA: 4  Anesthesia Plan: General   Post-op Pain Management:    Induction: Intravenous  PONV Risk Score and Plan: 2 and Ondansetron, Dexamethasone, Midazolam and Treatment may vary due to age or medical condition  Airway Management Planned: LMA  Additional Equipment: None  Intra-op Plan:   Post-operative Plan: Extubation in OR  Informed Consent: I have reviewed the patients History and Physical, chart, labs and discussed the procedure including the risks, benefits and alternatives for the proposed anesthesia with the patient or authorized representative who has indicated his/her understanding and acceptance.     Dental advisory given  Plan Discussed with:   Anesthesia Plan Comments: (See PAT note 08/06/2021, Konrad Felix Ward, PA-C)       Anesthesia Quick Evaluation

## 2021-08-08 NOTE — H&P (Signed)
Procedure/ Nurse Visit Report     07/19/2021   --------------------------------------------------------------------------------   Wayne [name]. [place]  [id number]  DOB: 1941/08/05, 80 year old Male  SSN: [id number]   PRIMARY CARE:  Carolann Littler, MD  REFERRING:  Georgette Dover, MD  PROVIDER:  Festus Aloe, M.D.  LOCATION:  [facility] [phone]     --------------------------------------------------------------------------------   CC/HPI: F/u -   1) h/o bladder ca - 2018 HG Ta and 2021 small T1 disease.   His last U/S or CT Scan was 05/17/2020. US done 11/21 for gross hematuria - benign.   Prior bx:  November 2018 TURBT-left HG Ta + epirubicin.  June 2019 left CIS + epirubicin  June 2021 trigone HG T1 (focal T1) 1 cm lesion, normal RGP, gemcitabine   BCG: Sep 2019 BCG x 6   Cysto 12/21, benign with membranous stricture and same 03/22. Cysto today without obvious tumor but too much erythema and cystitis from foley.    2) h/o PCa - brachytherapy for CaP in 2009 for a T1c, PSA 4.39, Gleason 3+3=6 three cores left, 39 gram prostate    3) urinary retention, membranous urethral sx - pt with weak stream. Retention 03/20. Scope dilated a sx 09/21. Pt c/o dysuria, hematuria and weak stream. On cysto 12/21 required passage of a 14 Fr and 18 Fr catheter before scope would advance. PVR 272 ml. Urine is clear.   Sx dilated 03/22, to 20 Fr over a wire. Foley placed. Void trial passed. Went into retention again 04/22 and passed. Retention again 05/22. Took foley out June 2022. Cysto June 2022 with mild sx and scope passed right into bladder. Scheduled UDS.   Presented today for UDS and noted a weak stream. Jamie couldn't pass UDs catheter and I couldn't pass a 14 Fr coude.    Today, seen in management of the above and for catheter removal.     PROCEDURES:         Cysto Dilate Urethra Heyman Dilators - [phone]  Risks, benefits, and some of the potential  complications of the procedure were discussed at length with the patient including infection, bleeding, voiding discomfort, urinary retention, fever, chills, sepsis, and others. All questions were answered. Informed consent was obtained. Antibiotic prophylaxis was given. Sterile technique and intraurethral analgesia were used.  Meatus:  Normal size. Normal location. Normal condition.  Urethra:  No strictures.  External Sphincter:  Normal.  Verumontanum:  Normal.  Prostate:  Non-obstructing. No hyperplasia.  Bladder Neck:  Non-obstructing.  Ureteral Orifices:  Normal location. Normal size. Normal shape. Effluxed clear urine.  Bladder:  No trabeculation. No tumors. Normal mucosa. No stones.    Heyman dilators were used to dilate the urethra from 67 [place] to 20 [place].  The lower urinary tract was carefully examined. A 16 Fr council catheter was passed over the wire and left to gravity drainage. The procedure was well-tolerated and without complications. Antibiotic instructions were given. Instructions were given to call the office immediately for bloody urine, difficulty urinating, urinary retention, painful or frequent urination, fever, chills, nausea, vomiting or other illness. The patient stated that he understood these instructions and would comply with them.   ASSESSMENT:      ICD-10 Details  1 GU:   Membranous urethral stricture - N35.012 Chronic, Stable - I sent in cephalexin and discussed the nature r/b/a to Optilume stent balloon dilation with patient including risk of recurrent sx and incontinence among others - he elects to proceed.  PLAN:            Medications New Meds: Cephalexin 500 mg capsule 1 capsule PO BID   #6  0 Refill(s)            Schedule Return Visit/Planned Activity: Next Available Appointment - Schedule Surgery          Document Letter(s):  Created for Patient: Clinical Summary         Next Appointment:      Next Appointment: 08/21/2021 09:45 AM     Appointment Type: Male Cysto    Location: [facility] [phone] [id number]    Provider: Festus Aloe, M.D.    Reason for Visit: 48mocysto      * Signed by MFestus Aloe M.D. on 07/19/21 at 4:11 PM (EDT)*      The information contained in this medical record document is considered private and confidential patient information. This information can only be used for the medical diagnosis and/or medical services that are being provided by the patient's selected caregivers. This information can only be distributed outside of the patient's care if the patient agrees and signs waivers of authorization for this information to be sent to an outside source or route.

## 2021-08-09 ENCOUNTER — Encounter (HOSPITAL_COMMUNITY): Payer: Self-pay | Admitting: Urology

## 2021-08-09 ENCOUNTER — Encounter (HOSPITAL_COMMUNITY)
Admission: RE | Disposition: A | Discharge status: Home / Self Care | Payer: Self-pay | Source: Home / Self Care | Attending: Urology

## 2021-08-09 ENCOUNTER — Other Ambulatory Visit: Payer: Self-pay

## 2021-08-09 ENCOUNTER — Ambulatory Visit (HOSPITAL_COMMUNITY): Payer: Medicare Other | Admitting: Certified Registered"

## 2021-08-09 ENCOUNTER — Ambulatory Visit (HOSPITAL_COMMUNITY): Payer: Medicare Other | Admitting: Physician Assistant

## 2021-08-09 ENCOUNTER — Ambulatory Visit (HOSPITAL_COMMUNITY)
Admission: RE | Admit: 2021-08-09 | Discharge: 2021-08-09 | Disposition: A | Discharge status: Home / Self Care | Payer: Medicare Other | Attending: Urology | Admitting: Urology

## 2021-08-09 ENCOUNTER — Ambulatory Visit (HOSPITAL_COMMUNITY): Payer: Medicare Other

## 2021-08-09 DIAGNOSIS — Z79899 Other long term (current) drug therapy: Secondary | ICD-10-CM | POA: Insufficient documentation

## 2021-08-09 DIAGNOSIS — I480 Paroxysmal atrial fibrillation: Secondary | ICD-10-CM | POA: Diagnosis not present

## 2021-08-09 DIAGNOSIS — N99111 Postprocedural bulbous urethral stricture: Secondary | ICD-10-CM

## 2021-08-09 DIAGNOSIS — N35919 Unspecified urethral stricture, male, unspecified site: Secondary | ICD-10-CM | POA: Diagnosis not present

## 2021-08-09 DIAGNOSIS — Z8551 Personal history of malignant neoplasm of bladder: Secondary | ICD-10-CM | POA: Diagnosis not present

## 2021-08-09 DIAGNOSIS — G4733 Obstructive sleep apnea (adult) (pediatric): Secondary | ICD-10-CM | POA: Diagnosis not present

## 2021-08-09 DIAGNOSIS — N35011 Post-traumatic bulbous urethral stricture: Secondary | ICD-10-CM | POA: Diagnosis not present

## 2021-08-09 DIAGNOSIS — E785 Hyperlipidemia, unspecified: Secondary | ICD-10-CM | POA: Diagnosis not present

## 2021-08-09 HISTORY — PX: CYSTOSCOPY WITH URETHRAL DILATATION: SHX5125

## 2021-08-09 LAB — GLUCOSE, CAPILLARY
Glucose-Capillary: 188 mg/dL — ABNORMAL HIGH (ref 70–99)
Glucose-Capillary: 180 mg/dL — ABNORMAL HIGH (ref 70–99)

## 2021-08-09 SURGERY — CYSTOSCOPY, WITH URETHRAL DILATION
Anesthesia: General

## 2021-08-09 MED ORDER — PROPOFOL 10 MG/ML IV BOLUS
INTRAVENOUS | Status: AC
  Filled 2021-08-09: qty 40

## 2021-08-09 MED ORDER — EPHEDRINE SULFATE-NACL 50-0.9 MG/10ML-% IV SOSY
PREFILLED_SYRINGE | INTRAVENOUS | Status: DC | PRN
  Administered 2021-08-09 (×3): 5 mg via INTRAVENOUS

## 2021-08-09 MED ORDER — EPHEDRINE 5 MG/ML INJ
INTRAVENOUS | Status: AC
  Filled 2021-08-09: qty 5

## 2021-08-09 MED ORDER — LEVOFLOXACIN IN D5W 750 MG/150ML IV SOLN
750.0000 mg | INTRAVENOUS | Status: AC
  Administered 2021-08-09: 750 mg via INTRAVENOUS
  Filled 2021-08-09: qty 150

## 2021-08-09 MED ORDER — AMISULPRIDE (ANTIEMETIC) 5 MG/2ML IV SOLN: 10.0000 mg | Freq: Once | INTRAVENOUS | Status: DC | PRN

## 2021-08-09 MED ORDER — LACTATED RINGERS IV SOLN: INTRAVENOUS | Status: DC

## 2021-08-09 MED ORDER — PHENYLEPHRINE 40 MCG/ML (10ML) SYRINGE FOR IV PUSH (FOR BLOOD PRESSURE SUPPORT)
PREFILLED_SYRINGE | INTRAVENOUS | Status: AC
  Filled 2021-08-09: qty 10

## 2021-08-09 MED ORDER — DEXAMETHASONE SODIUM PHOSPHATE 10 MG/ML IJ SOLN
INTRAMUSCULAR | Status: DC | PRN
  Administered 2021-08-09: 4 mg via INTRAVENOUS

## 2021-08-09 MED ORDER — OXYCODONE HCL 5 MG/5ML PO SOLN: 5.0000 mg | Freq: Once | ORAL | Status: DC | PRN

## 2021-08-09 MED ORDER — LIDOCAINE 2% (20 MG/ML) 5 ML SYRINGE
INTRAMUSCULAR | Status: DC | PRN
  Administered 2021-08-09: 40 mg via INTRAVENOUS

## 2021-08-09 MED ORDER — PROPOFOL 10 MG/ML IV BOLUS
INTRAVENOUS | Status: DC | PRN
  Administered 2021-08-09: 70 mg via INTRAVENOUS

## 2021-08-09 MED ORDER — IOHEXOL 300 MG/ML  SOLN
INTRAMUSCULAR | Status: DC | PRN
  Administered 2021-08-09: 10 mL

## 2021-08-09 MED ORDER — ONDANSETRON HCL 4 MG/2ML IJ SOLN: 4.0000 mg | Freq: Once | INTRAMUSCULAR | Status: DC | PRN

## 2021-08-09 MED ORDER — 0.9 % SODIUM CHLORIDE (POUR BTL) OPTIME
TOPICAL | Status: DC | PRN
  Administered 2021-08-09: 1000 mL

## 2021-08-09 MED ORDER — SODIUM CHLORIDE 0.9 % IR SOLN
Status: DC | PRN
  Administered 2021-08-09: 3000 mL via INTRAVESICAL
  Administered 2021-08-09: 500 mL via INTRAVESICAL

## 2021-08-09 MED ORDER — FENTANYL CITRATE (PF) 100 MCG/2ML IJ SOLN
INTRAMUSCULAR | Status: AC
  Filled 2021-08-09: qty 2

## 2021-08-09 MED ORDER — ONDANSETRON HCL 4 MG/2ML IJ SOLN
INTRAMUSCULAR | Status: DC | PRN
Start: 2021-08-09 — End: 2021-08-09
  Administered 2021-08-09: 4 mg via INTRAVENOUS

## 2021-08-09 MED ORDER — CHLORHEXIDINE GLUCONATE 0.12 % MT SOLN
15.0000 mL | Freq: Once | OROMUCOSAL | Status: AC
  Administered 2021-08-09: 15 mL via OROMUCOSAL

## 2021-08-09 MED ORDER — PHENYLEPHRINE 40 MCG/ML (10ML) SYRINGE FOR IV PUSH (FOR BLOOD PRESSURE SUPPORT)
PREFILLED_SYRINGE | INTRAVENOUS | Status: DC | PRN
  Administered 2021-08-09: 40 ug via INTRAVENOUS
  Administered 2021-08-09 (×2): 80 ug via INTRAVENOUS

## 2021-08-09 MED ORDER — FENTANYL CITRATE (PF) 100 MCG/2ML IJ SOLN
INTRAMUSCULAR | Status: DC | PRN
  Administered 2021-08-09: 25 ug via INTRAVENOUS

## 2021-08-09 MED ORDER — DEXAMETHASONE SODIUM PHOSPHATE 10 MG/ML IJ SOLN
INTRAMUSCULAR | Status: AC
  Filled 2021-08-09: qty 1

## 2021-08-09 MED ORDER — ONDANSETRON HCL 4 MG/2ML IJ SOLN
INTRAMUSCULAR | Status: AC
  Filled 2021-08-09: qty 2

## 2021-08-09 MED ORDER — FENTANYL CITRATE PF 50 MCG/ML IJ SOSY: 25.0000 ug | PREFILLED_SYRINGE | INTRAMUSCULAR | Status: DC | PRN

## 2021-08-09 MED ORDER — OXYCODONE HCL 5 MG PO TABS: 5.0000 mg | ORAL_TABLET | Freq: Once | ORAL | Status: DC | PRN

## 2021-08-09 MED ORDER — ORAL CARE MOUTH RINSE: 15.0000 mL | Freq: Once | OROMUCOSAL | Status: AC

## 2021-08-09 SURGICAL SUPPLY — 20 items
BAG DRN RND TRDRP ANRFLXCHMBR (UROLOGICAL SUPPLIES) ×1
BAG URINE DRAIN 2000ML AR STRL (UROLOGICAL SUPPLIES) ×2 IMPLANT
BALLN NEPHROSTOMY (BALLOONS)
BALLN OPTILUME DCB 30X5X75 (BALLOONS) ×2
BALLOON NEPHROSTOMY (BALLOONS) IMPLANT
BALLOON OPTILUME DCB 30X5X75 (BALLOONS) IMPLANT
CATH FOLEY 2W COUNCIL 20FR 5CC (CATHETERS) IMPLANT
CATH FOLEY 2W COUNCIL 5CC 18FR (CATHETERS) ×1 IMPLANT
CATH INTERMIT  6FR 70CM (CATHETERS) ×1 IMPLANT
CLOTH BEACON ORANGE TIMEOUT ST (SAFETY) ×2 IMPLANT
GLOVE SURG ENC MOIS LTX SZ7.5 (GLOVE) ×2 IMPLANT
GOWN STRL REUS W/TWL XL LVL3 (GOWN DISPOSABLE) ×2 IMPLANT
GUIDEWIRE ANG ZIPWIRE 038X150 (WIRE) IMPLANT
GUIDEWIRE STR DUAL SENSOR (WIRE) ×2 IMPLANT
KIT TURNOVER KIT A (KITS) ×2 IMPLANT
MANIFOLD NEPTUNE II (INSTRUMENTS) IMPLANT
NS IRRIG 1000ML POUR BTL (IV SOLUTION) ×1 IMPLANT
PACK CYSTO (CUSTOM PROCEDURE TRAY) ×2 IMPLANT
PENCIL SMOKE EVACUATOR (MISCELLANEOUS) IMPLANT
WATER STERILE IRR 3000ML UROMA (IV SOLUTION) ×2 IMPLANT

## 2021-08-09 NOTE — Interval H&P Note (Signed)
History and Physical Interval Note:  08/09/2021 12:45 PM  Wayne [name]  has presented today for surgery, with the diagnosis of URETHRAL STRICTURE.  The various methods of treatment have been discussed with the patient and family. After consideration of risks, benefits and other options for treatment, the patient has consented to  Procedure(s): CYSTOSCOPY WITH URETHRAL DILATATION WITH OPTILUME (N/A) as a surgical intervention.  The patient's history has been reviewed, patient examined, no change in status, stable for surgery.  I have reviewed the patient's chart and labs.  Questions were answered to the patient's satisfaction.  Discussed risk of incontinence and dysuria among others. Also recurrence. Condom use 6 mo, etc. He is well. No hematuria or fever.    Festus Aloe

## 2021-08-09 NOTE — Transfer of Care (Signed)
Immediate Anesthesia Transfer of Care Note  Patient: Wayne [name]  Procedure(s) Performed: CYSTOSCOPY WITH URETHRAL DILATATION WITH OPTILUME  Patient Location: PACU  Anesthesia Type:General  Level of Consciousness: awake, alert  and patient cooperative  Airway & Oxygen Therapy: Patient Spontanous Breathing and Patient connected to face mask oxygen  Post-op Assessment: Report given to RN and Post -op Vital signs reviewed and stable  Post vital signs: Reviewed and stable  Last Vitals:  Vitals Value Taken Time  BP 123/71 08/09/21 1352  Temp    Pulse 59 08/09/21 1354  Resp 11 08/09/21 1354  SpO2 100 % 08/09/21 1354  Vitals shown include unvalidated device data.  Last Pain:  Vitals:   08/09/21 1105  TempSrc: Oral         Complications: No notable events documented.

## 2021-08-09 NOTE — Discharge Instructions (Signed)
Urethral dilation, Care After This sheet gives you information about how to care for yourself after your procedure. Your health care provider may also give you more specific instructions. If you have problems or questions, contact your health careprovider. What can I expect after the procedure? After the procedure, it is common to have: Burning pain when urinating. Pain or discomfort in your genital area. A small amount of blood in your urine. Your health care provider will tell you how long you can expect to have blood in your urine. Bloody urine leaking from around your catheter. Follow these instructions at home: Catheter and drainage bag Follow instructions from your health care provider about how to care for your catheter and your drainage bag. Do not take baths, swim, or use a hot tub until your catheter has been removed. You may take showers while your catheter is in place. If you have to insert a catheter on your own (self-catheterization) after your catheter is removed, make sure you understand the procedure completely. Carefully follow instructions from your health care provider.   Medicines Take over-the-counter and prescription medicines only as told by your health care provider. Use a CONDOM for the next 6 months during intercourse  Ask your health care provider if the medicine prescribed to you: Requires you to avoid driving or using heavy machinery. Can cause constipation. You may need to take these actions to prevent or treat constipation: Take over-the-counter or prescription medicines. Eat foods that are high in fiber, such as beans, whole grains, and fresh fruits and vegetables. Limit foods that are high in fat and processed sugars, such as fried or sweet foods. Activity Do not drive for 24 hours if you were given a sedative during your procedure. Take short walks several times a day during your recovery. Do not lift anything that is heavier than 10 lb (4.5 kg), or the  limit that you are told, until your health care provider says that it is safe. Return to your normal activities as told by your health care provider. Ask your health care provider what activities are safe for you. Do not have sex until your health care provider says it is okay.   General instructions Drink enough fluid to keep your urine pale yellow. If a bandage (dressing) was applied over the opening of your urethra, change the dressing as told by your health care provider. Make sure you: Wash your hands with soap and water before and after you change your dressing. If soap and water are not available, use hand sanitizer. Keep your dressing clean and dry. Do not use any products that contain nicotine or tobacco, such as cigarettes, e-cigarettes, and chewing tobacco. These can delay healing after surgery. If you need help quitting, ask your health care provider. Keep all follow-up visits as told by your health care provider. This is important. Contact a health care provider if you: Have a fever or chills. Have pain that gets worse or does not get better with medicine. Have blood in your urine for longer than your health care provider told you to expect. Are a male and have any of these problems: Trouble getting an erection. Pain when you have an erection. Blood in your semen. Have any of these problems after your catheter is removed: Trouble urinating. A slow urine stream. Urinating less than usual. Several streams or "spray" when you urinate. Have pain in your abdomen. Have swelling in your genital area that does not go away. Get help right away if:  You have severe pain. A lot of blood is leaking from around your catheter. You have blood clots in your urine. Your catheter stops draining urine. You cannot urinate after your catheter is removed. You have redness, warmth, or pain in your leg. You have chest pain. You have trouble breathing. These symptoms may represent a serious  problem that is an emergency. Do not wait to see if the symptoms will go away. Get medical help right away. Call your local emergency services (911 in the [place] Do not drive yourself to the hospital. Summary After the procedure, it is common to have burning pain when urinating and a small amount of blood in your urine. Follow instructions from your health care provider about how to care for your catheter and your drainage bag. Take short walks several times a day during your recovery. If a bandage (dressing) was applied over the opening of your urethra, change the dressing as told by your health care provider. Contact your health care provider if you have trouble urinating after your catheter is removed. This information is not intended to replace advice given to you by your health care provider. Make sure you discuss any questions you have with your healthcare provider. Document Revised: 06/06/2019 Document Reviewed: 06/06/2019 Elsevier Patient Education  [place].

## 2021-08-09 NOTE — Anesthesia Postprocedure Evaluation (Signed)
Anesthesia Post Note  Patient: [place]  Procedure(s) Performed: CYSTOSCOPY WITH URETHRAL DILATATION WITH OPTILUME     Patient location during evaluation: PACU Anesthesia Type: General Level of consciousness: awake and alert Pain management: pain level controlled Vital Signs Assessment: post-procedure vital signs reviewed and stable Respiratory status: spontaneous breathing, nonlabored ventilation and respiratory function stable Cardiovascular status: blood pressure returned to baseline and stable Postop Assessment: no apparent nausea or vomiting Anesthetic complications: no   No notable events documented.  Last Vitals:  Vitals:   08/09/21 1442 08/09/21 1445  BP: 124/65 (!) 142/80  Pulse: 61 (!) 58  Resp: 12 16  Temp:    SpO2: 93% 92%    Last Pain:  Vitals:   08/09/21 1445  TempSrc:   PainSc: 0-No pain                 Lidia Collum

## 2021-08-09 NOTE — Op Note (Signed)
Preoperative diagnosis: Urethral stricture Postoperative diagnosis: Same  Procedure: Cystoscopy with OPTi ume balloon dilation of urethral stricture  Surgeon: Junious Silk  Anesthesia: General  Indication for procedure: Wayne [name] is a 80 year old male status post brachytherapy has had recurrent proximal bulbar urethral stricture and was brought today for above procedure.  Findings: Stricture well dilated from prior office dilation and catheter placement.  Bladder appeared normal without mucosal lesion, stone or foreign body.  Prostatic urethra patent.  Description of procedure: After consent was obtained patient brought to the operating room.  After adequate anesthesia was placed lithotomy position.  The Foley catheter was removed.  He was prepped and draped in the usual sterile fashion.  Timeout was performed to confirm the patient and procedure.  Cystoscope was passed per urethra where it passed into the bladder.  Scope was 21 [place] rigid.  Bladder inspected.  Scope backed out and sensor wire passed under direct vision.  3 cm OptiLme, 30 French balloon was passed across the bulbar stricture.  Balloon was inflated and under direct visual and fluoroscopic guidance confirmed to be in good position across the stricture.  It was left inflated for 10 minutes.  Balloon was deflated and scope removed.  A quick reinspection but not passage of the scope across the stricture noted the stricture to be well dilated without significant further dilation or trauma.  Scope was backed out and an 72 [place] council tip catheter advanced over the wire balloon inflated and seated bladder neck.  Wire removed.  Catheter draining clear urine.  He is awakened taken recovery room in stable condition.  Complications: None  Blood loss: Minimal  Specimens: None  Drains: 18 French council tip catheter  Disposition: Patient stable to PACU

## 2021-08-09 NOTE — Anesthesia Procedure Notes (Addendum)
Procedure Name: LMA Insertion Date/Time: 08/09/2021 1:03 PM Performed by: Eben Burow, CRNA Pre-anesthesia Checklist: Patient identified, Emergency Drugs available, Suction available, Patient being monitored and Timeout performed Patient Re-evaluated:Patient Re-evaluated prior to induction Oxygen Delivery Method: Circle system utilized Preoxygenation: Pre-oxygenation with 100% oxygen Induction Type: IV induction Ventilation: Mask ventilation without difficulty LMA: LMA with gastric port inserted LMA Size: 4.0 Number of attempts: 1 Placement Confirmation: positive ETCO2 and breath sounds checked- equal and bilateral Tube secured with: Tape Dental Injury: Teeth and Oropharynx as per pre-operative assessment  Comments: Performed by Loura Pardon

## 2021-08-14 NOTE — Interval H&P Note (Signed)
History and Physical Interval Note:  08/14/2021 4:02 PM  Wayne [name]  has presented today for surgery, with the diagnosis of URETHRAL STRICTURE.   Physical exam: NAD Alert and oriented x 3 CV - RRR Resp - regular effort and depth Abd - soft, NT Ext - no calf pain or swelling    Festus Aloe

## 2021-08-16 ENCOUNTER — Other Ambulatory Visit (HOSPITAL_COMMUNITY): Payer: Self-pay | Admitting: *Deleted

## 2021-08-16 DIAGNOSIS — I5022 Chronic systolic (congestive) heart failure: Secondary | ICD-10-CM

## 2021-08-16 MED ORDER — FUROSEMIDE 20 MG PO TABS: 20.0000 mg | ORAL_TABLET | Freq: Two times a day (BID) | ORAL | 11 refills | Status: DC

## 2021-08-16 MED ORDER — CARVEDILOL 12.5 MG PO TABS: 12.5000 mg | ORAL_TABLET | Freq: Two times a day (BID) | ORAL | 11 refills | Status: DC

## 2021-08-16 MED ORDER — ROSUVASTATIN CALCIUM 20 MG PO TABS: 20.0000 mg | ORAL_TABLET | Freq: Every day | ORAL | 6 refills | Status: DC

## 2021-08-16 MED ORDER — SPIRONOLACTONE 25 MG PO TABS: 12.5000 mg | ORAL_TABLET | Freq: Every day | ORAL | 6 refills | Status: DC

## 2021-08-16 MED ORDER — LOSARTAN POTASSIUM 25 MG PO TABS: 25.0000 mg | ORAL_TABLET | Freq: Two times a day (BID) | ORAL | 6 refills | Status: DC

## 2021-08-16 MED ORDER — AMIODARONE HCL 200 MG PO TABS: 200.0000 mg | ORAL_TABLET | Freq: Every day | ORAL | 6 refills | Status: DC

## 2021-08-21 ENCOUNTER — Telehealth (HOSPITAL_COMMUNITY): Payer: Self-pay | Admitting: Pharmacy Technician

## 2021-08-21 NOTE — Telephone Encounter (Signed)
Advanced Heart Failure Patient Advocate Encounter  Spoke with patient today who brought in POI for Eliquis assistance. I advised him that the OOP report from the pharmacy was missing. He recently switched from [facility] to [facility] for all his medications. Advised the patient to make sure and get the OOP from both pharmacies.   Will have the application at the check in desk for signature. Will provide a month of samples as well.

## 2021-08-26 ENCOUNTER — Ambulatory Visit (INDEPENDENT_AMBULATORY_CARE_PROVIDER_SITE_OTHER): Payer: Medicare Other

## 2021-08-26 DIAGNOSIS — Z95810 Presence of automatic (implantable) cardiac defibrillator: Secondary | ICD-10-CM | POA: Diagnosis not present

## 2021-08-26 DIAGNOSIS — I5022 Chronic systolic (congestive) heart failure: Secondary | ICD-10-CM | POA: Diagnosis not present

## 2021-08-27 ENCOUNTER — Telehealth: Payer: Self-pay

## 2021-08-27 DIAGNOSIS — M25552 Pain in left hip: Secondary | ICD-10-CM | POA: Diagnosis not present

## 2021-08-27 DIAGNOSIS — M25551 Pain in right hip: Secondary | ICD-10-CM | POA: Diagnosis not present

## 2021-08-27 DIAGNOSIS — R2689 Other abnormalities of gait and mobility: Secondary | ICD-10-CM | POA: Diagnosis not present

## 2021-08-27 DIAGNOSIS — R278 Other lack of coordination: Secondary | ICD-10-CM | POA: Diagnosis not present

## 2021-08-27 NOTE — Progress Notes (Signed)
EPIC Encounter for ICM Monitoring  Patient Name: Wayne [name] is a 80 y.o. male Date: 08/27/2021 Primary Care Physican: [name], MD Primary Cardiologist: Aundra Dubin Electrophysiologist: Lovena Le 06/25/2021 Weight: 239 lbs 07/21/2021 Weight:  229-231 lbs (baseline)          Attempted call to patient and unable to reach.  Left detailed message per DPR regarding transmission. Transmission reviewed.    Optivol thoracic impedance suggesting fluid levels returned to normal after HF doubled Lasix x 2 days last week.    Prescribed: Furosemide 20 mg take 1 tablet (20 mg total) by mouth twice a day. Potassium 20 mEq  take 1 tablet (20 mEq total) by mouth daily. Spironolactone 25 mg take 0.5 tablet (12.5 mg total) daily   Labs: 07/19/2021 Creatinine 1.31, BUN 18, Potassium 4.9, Sodium 140, GFR 56 07/03/2021 Creatinine 1.26, BUN 22, Potassium 4.0, Sodium 139, GFR 59 06/25/2021 Creatinine 1.20, BUN 15, Potassium 4.5, Sodium 140, GFR >60 05/11/2021 Creatinine 1.16, BUN 20, Potassium 4.6, Sodium 138, GFR >60  A complete set of results can be found in Results Review.   Recommendations:  Left voice mail with ICM number and encouraged to call if experiencing any fluid symptoms.   Follow-up plan: ICM clinic phone appointment on 09/30/2021.   91 day device clinic remote transmission 09/12/2021.     EP/Cardiology Office Visits: 11/18/2021 with Dr Aundra Dubin.  09/18/2021 with Dr Lovena Le.     Copy of ICM check sent to Dr. Lovena Le.    3 month ICM trend: 08/26/2021.    1 Year ICM trend:       Rosalene Billings, RN 08/27/2021 4:38 PM

## 2021-08-27 NOTE — Telephone Encounter (Signed)
Remote ICM transmission received.  Attempted call to patient regarding ICM remote transmission and left detailed message per DPR.  Advised to return call for any fluid symptoms or questions. Next ICM remote transmission scheduled 09/30/2021.    

## 2021-08-28 DIAGNOSIS — R2689 Other abnormalities of gait and mobility: Secondary | ICD-10-CM | POA: Diagnosis not present

## 2021-08-28 DIAGNOSIS — M25552 Pain in left hip: Secondary | ICD-10-CM | POA: Diagnosis not present

## 2021-08-28 DIAGNOSIS — M25551 Pain in right hip: Secondary | ICD-10-CM | POA: Diagnosis not present

## 2021-08-28 DIAGNOSIS — R278 Other lack of coordination: Secondary | ICD-10-CM | POA: Diagnosis not present

## 2021-08-28 NOTE — Telephone Encounter (Signed)
Sent in application, OOP and POI to [facility] via fax.  Will follow up.

## 2021-09-02 DIAGNOSIS — M25551 Pain in right hip: Secondary | ICD-10-CM | POA: Diagnosis not present

## 2021-09-02 DIAGNOSIS — M25552 Pain in left hip: Secondary | ICD-10-CM | POA: Diagnosis not present

## 2021-09-02 DIAGNOSIS — R278 Other lack of coordination: Secondary | ICD-10-CM | POA: Diagnosis not present

## 2021-09-02 DIAGNOSIS — R2689 Other abnormalities of gait and mobility: Secondary | ICD-10-CM | POA: Diagnosis not present

## 2021-09-04 DIAGNOSIS — R278 Other lack of coordination: Secondary | ICD-10-CM | POA: Diagnosis not present

## 2021-09-04 DIAGNOSIS — M25551 Pain in right hip: Secondary | ICD-10-CM | POA: Diagnosis not present

## 2021-09-04 DIAGNOSIS — M25552 Pain in left hip: Secondary | ICD-10-CM | POA: Diagnosis not present

## 2021-09-04 DIAGNOSIS — R2689 Other abnormalities of gait and mobility: Secondary | ICD-10-CM | POA: Diagnosis not present

## 2021-09-06 DIAGNOSIS — M25552 Pain in left hip: Secondary | ICD-10-CM | POA: Diagnosis not present

## 2021-09-06 DIAGNOSIS — M25551 Pain in right hip: Secondary | ICD-10-CM | POA: Diagnosis not present

## 2021-09-06 DIAGNOSIS — R278 Other lack of coordination: Secondary | ICD-10-CM | POA: Diagnosis not present

## 2021-09-06 DIAGNOSIS — R2689 Other abnormalities of gait and mobility: Secondary | ICD-10-CM | POA: Diagnosis not present

## 2021-09-09 DIAGNOSIS — R2689 Other abnormalities of gait and mobility: Secondary | ICD-10-CM | POA: Diagnosis not present

## 2021-09-09 DIAGNOSIS — R278 Other lack of coordination: Secondary | ICD-10-CM | POA: Diagnosis not present

## 2021-09-09 DIAGNOSIS — M25551 Pain in right hip: Secondary | ICD-10-CM | POA: Diagnosis not present

## 2021-09-09 DIAGNOSIS — M25552 Pain in left hip: Secondary | ICD-10-CM | POA: Diagnosis not present

## 2021-09-10 ENCOUNTER — Encounter (HOSPITAL_COMMUNITY): Payer: Self-pay | Admitting: Urology

## 2021-09-11 DIAGNOSIS — M25551 Pain in right hip: Secondary | ICD-10-CM | POA: Diagnosis not present

## 2021-09-11 DIAGNOSIS — M25552 Pain in left hip: Secondary | ICD-10-CM | POA: Diagnosis not present

## 2021-09-11 DIAGNOSIS — R2689 Other abnormalities of gait and mobility: Secondary | ICD-10-CM | POA: Diagnosis not present

## 2021-09-11 DIAGNOSIS — R278 Other lack of coordination: Secondary | ICD-10-CM | POA: Diagnosis not present

## 2021-09-12 ENCOUNTER — Ambulatory Visit (INDEPENDENT_AMBULATORY_CARE_PROVIDER_SITE_OTHER): Payer: Medicare Other

## 2021-09-12 DIAGNOSIS — I255 Ischemic cardiomyopathy: Secondary | ICD-10-CM

## 2021-09-13 DIAGNOSIS — R278 Other lack of coordination: Secondary | ICD-10-CM | POA: Diagnosis not present

## 2021-09-13 DIAGNOSIS — M25551 Pain in right hip: Secondary | ICD-10-CM | POA: Diagnosis not present

## 2021-09-13 DIAGNOSIS — R2689 Other abnormalities of gait and mobility: Secondary | ICD-10-CM | POA: Diagnosis not present

## 2021-09-13 DIAGNOSIS — M25552 Pain in left hip: Secondary | ICD-10-CM | POA: Diagnosis not present

## 2021-09-16 DIAGNOSIS — M25552 Pain in left hip: Secondary | ICD-10-CM | POA: Diagnosis not present

## 2021-09-16 DIAGNOSIS — R2689 Other abnormalities of gait and mobility: Secondary | ICD-10-CM | POA: Diagnosis not present

## 2021-09-16 DIAGNOSIS — R278 Other lack of coordination: Secondary | ICD-10-CM | POA: Diagnosis not present

## 2021-09-16 DIAGNOSIS — M25551 Pain in right hip: Secondary | ICD-10-CM | POA: Diagnosis not present

## 2021-09-17 ENCOUNTER — Other Ambulatory Visit (HOSPITAL_COMMUNITY): Payer: Self-pay | Admitting: Cardiology

## 2021-09-17 DIAGNOSIS — I5022 Chronic systolic (congestive) heart failure: Secondary | ICD-10-CM

## 2021-09-18 ENCOUNTER — Other Ambulatory Visit: Payer: Self-pay

## 2021-09-18 ENCOUNTER — Encounter: Payer: Medicare Other | Admitting: *Deleted

## 2021-09-18 ENCOUNTER — Encounter (HOSPITAL_COMMUNITY): Payer: Self-pay | Admitting: Cardiology

## 2021-09-18 ENCOUNTER — Ambulatory Visit (HOSPITAL_COMMUNITY)
Admission: RE | Admit: 2021-09-18 | Discharge: 2021-09-18 | Disposition: A | Discharge status: Home / Self Care | Payer: Medicare Other | Source: Ambulatory Visit | Attending: Cardiology | Admitting: Cardiology

## 2021-09-18 VITALS — BP 138/70 | HR 62 | Wt 229.4 lb

## 2021-09-18 VITALS — BP 138/70 | HR 62 | Wt 229.0 lb

## 2021-09-18 DIAGNOSIS — Z006 Encounter for examination for normal comparison and control in clinical research program: Secondary | ICD-10-CM

## 2021-09-18 DIAGNOSIS — I2510 Atherosclerotic heart disease of native coronary artery without angina pectoris: Secondary | ICD-10-CM | POA: Insufficient documentation

## 2021-09-18 DIAGNOSIS — N183 Chronic kidney disease, stage 3 unspecified: Secondary | ICD-10-CM | POA: Insufficient documentation

## 2021-09-18 DIAGNOSIS — Z87440 Personal history of urinary (tract) infections: Secondary | ICD-10-CM | POA: Diagnosis not present

## 2021-09-18 DIAGNOSIS — I255 Ischemic cardiomyopathy: Secondary | ICD-10-CM | POA: Insufficient documentation

## 2021-09-18 DIAGNOSIS — Z8674 Personal history of sudden cardiac arrest: Secondary | ICD-10-CM | POA: Insufficient documentation

## 2021-09-18 DIAGNOSIS — I5022 Chronic systolic (congestive) heart failure: Secondary | ICD-10-CM | POA: Insufficient documentation

## 2021-09-18 DIAGNOSIS — R278 Other lack of coordination: Secondary | ICD-10-CM | POA: Diagnosis not present

## 2021-09-18 DIAGNOSIS — I130 Hypertensive heart and chronic kidney disease with heart failure and stage 1 through stage 4 chronic kidney disease, or unspecified chronic kidney disease: Secondary | ICD-10-CM | POA: Insufficient documentation

## 2021-09-18 DIAGNOSIS — Z955 Presence of coronary angioplasty implant and graft: Secondary | ICD-10-CM | POA: Insufficient documentation

## 2021-09-18 DIAGNOSIS — G4733 Obstructive sleep apnea (adult) (pediatric): Secondary | ICD-10-CM | POA: Diagnosis not present

## 2021-09-18 DIAGNOSIS — Z87891 Personal history of nicotine dependence: Secondary | ICD-10-CM | POA: Insufficient documentation

## 2021-09-18 DIAGNOSIS — Z79899 Other long term (current) drug therapy: Secondary | ICD-10-CM | POA: Diagnosis not present

## 2021-09-18 DIAGNOSIS — E1122 Type 2 diabetes mellitus with diabetic chronic kidney disease: Secondary | ICD-10-CM | POA: Diagnosis not present

## 2021-09-18 DIAGNOSIS — I480 Paroxysmal atrial fibrillation: Secondary | ICD-10-CM | POA: Insufficient documentation

## 2021-09-18 DIAGNOSIS — R2689 Other abnormalities of gait and mobility: Secondary | ICD-10-CM | POA: Diagnosis not present

## 2021-09-18 DIAGNOSIS — M25552 Pain in left hip: Secondary | ICD-10-CM | POA: Diagnosis not present

## 2021-09-18 DIAGNOSIS — M25551 Pain in right hip: Secondary | ICD-10-CM | POA: Diagnosis not present

## 2021-09-18 DIAGNOSIS — I447 Left bundle-branch block, unspecified: Secondary | ICD-10-CM | POA: Diagnosis not present

## 2021-09-18 DIAGNOSIS — E785 Hyperlipidemia, unspecified: Secondary | ICD-10-CM

## 2021-09-18 DIAGNOSIS — I4901 Ventricular fibrillation: Secondary | ICD-10-CM | POA: Insufficient documentation

## 2021-09-18 DIAGNOSIS — Z7901 Long term (current) use of anticoagulants: Secondary | ICD-10-CM | POA: Insufficient documentation

## 2021-09-18 DIAGNOSIS — Z95810 Presence of automatic (implantable) cardiac defibrillator: Secondary | ICD-10-CM | POA: Diagnosis not present

## 2021-09-18 LAB — CUP PACEART REMOTE DEVICE CHECK
Battery Remaining Longevity: 65 mo
Battery Voltage: 2.98 V
Brady Statistic RV Percent Paced: 0.01 %
Date Time Interrogation Session: 20220930145000
HighPow Impedance: 64 Ohm
Implantable Lead Implant Date: 20180612
Implantable Lead Location: 753860
Implantable Lead Model: 6935
Implantable Pulse Generator Implant Date: 20180103
Lead Channel Impedance Value: 380 Ohm
Lead Channel Impedance Value: 437 Ohm
Lead Channel Pacing Threshold Amplitude: 1 V
Lead Channel Pacing Threshold Pulse Width: 0.4 ms
Lead Channel Sensing Intrinsic Amplitude: 12.25 mV
Lead Channel Sensing Intrinsic Amplitude: 12.25 mV
Lead Channel Setting Pacing Amplitude: 2.5 V
Lead Channel Setting Pacing Pulse Width: 0.4 ms
Lead Channel Setting Sensing Sensitivity: 0.3 mV

## 2021-09-18 LAB — LIPID PANEL
Cholesterol: 176 mg/dL (ref 0–200)
HDL: 42 mg/dL (ref >40)
LDL Cholesterol: 108 mg/dL — ABNORMAL HIGH (ref 0–99)
Total CHOL/HDL Ratio: 4.2 RATIO
Triglycerides: 128 mg/dL (ref <150)
VLDL: 26 mg/dL (ref 0–40)

## 2021-09-18 LAB — COMPREHENSIVE METABOLIC PANEL
ALT: 30 U/L (ref 0–44)
AST: 29 U/L (ref 15–41)
Albumin: 3.8 g/dL (ref 3.5–5.0)
Alkaline Phosphatase: 76 U/L (ref 38–126)
Anion gap: 8 (ref 5–15)
BUN: 25 mg/dL — ABNORMAL HIGH (ref 8–23)
CO2: 25 mmol/L (ref 22–32)
Calcium: 9.1 mg/dL (ref 8.9–10.3)
Chloride: 105 mmol/L (ref 98–111)
Creatinine, Ser: 1.38 mg/dL — ABNORMAL HIGH (ref 0.61–1.24)
GFR, Estimated: 53 mL/min — ABNORMAL LOW (ref >60)
Glucose, Bld: 167 mg/dL — ABNORMAL HIGH (ref 70–99)
Potassium: 4.8 mmol/L (ref 3.5–5.1)
Sodium: 138 mmol/L (ref 135–145)
Total Bilirubin: 1 mg/dL (ref 0.3–1.2)
Total Protein: 6.5 g/dL (ref 6.5–8.1)

## 2021-09-18 LAB — TSH: TSH: 2.238 u[IU]/mL (ref 0.350–4.500)

## 2021-09-18 MED ORDER — SPIRONOLACTONE 25 MG PO TABS: 25.0000 mg | ORAL_TABLET | Freq: Every day | ORAL | 3 refills | Status: DC

## 2021-09-18 NOTE — Patient Instructions (Addendum)
Labs done today. We will contact you only if your labs are abnormal.  INCREASE Spironolactone to 25mg  (1 tablet) by mouth daily.    No other medication changes were made. Please continue all current medications as prescribed.  Your physician recommends that you schedule a follow-up appointment in: 10 days for a lab only appointment and in 3 months with an echo prior to your exam. Please contact our office in December to schedule a January 2023 appointment.   If you have any questions or concerns before your next appointment please send [place] a message through [place] or call our office at [phone].    TO LEAVE A MESSAGE FOR THE NURSE SELECT OPTION 2, PLEASE LEAVE A MESSAGE INCLUDING: YOUR NAME DATE OF BIRTH CALL BACK NUMBER REASON FOR CALL**this is important as we prioritize the call backs  YOU WILL RECEIVE A CALL BACK THE SAME DAY AS LONG AS YOU CALL BEFORE 4:00 PM   Do the following things EVERYDAY: Weigh yourself in the morning before breakfast. Write it down and keep it in a log. Take your medicines as prescribed Eat low salt foods--Limit salt (sodium) to 2000 mg per day.  Stay as active as you can everyday Limit all fluids for the day to less than 2 liters   At the [facility], you and your health needs are our priority. As part of our continuing mission to provide you with exceptional heart care, we have created designated Provider Care Teams. These Care Teams include your primary Cardiologist (physician) and Advanced Practice Providers (APPs- Physician Assistants and Nurse Practitioners) who all work together to provide you with the care you need, when you need it.   You may see any of the following providers on your designated Care Team at your next follow up: Dr Glori Bickers Dr Haynes Kerns, NP Lyda Jester, [place] Audry Riles, PharmD   Please be sure to bring in all your medications bottles to every appointment.

## 2021-09-19 ENCOUNTER — Telehealth (HOSPITAL_COMMUNITY): Payer: Self-pay

## 2021-09-19 DIAGNOSIS — E785 Hyperlipidemia, unspecified: Secondary | ICD-10-CM

## 2021-09-19 MED ORDER — ROSUVASTATIN CALCIUM 40 MG PO TABS: 40.0000 mg | ORAL_TABLET | Freq: Every day | ORAL | 3 refills | Status: DC

## 2021-09-19 NOTE — Telephone Encounter (Signed)
-----   Message from Larey Dresser, MD sent at 09/18/2021  4:24 PM EDT ----- Want LDL < 70, increase Crestor to 40 mg daily.  Lipids/LFTs in 2 months.

## 2021-09-19 NOTE — Telephone Encounter (Signed)
Patient advised and verbalized understanding. Med changes updated, patient has pending appt with Dr. Lovena Le 12/5 will repeat labs at that time.   Meds ordered this encounter  Medications   rosuvastatin (CRESTOR) 40 MG tablet    Sig: Take 1 tablet (40 mg total) by mouth daily.    Dispense:  90 tablet    Refill:  3    Please cancel all previous orders for current medication. Change in dosage or pill size.   Orders Placed This Encounter  Procedures   Lipid panel    Standing Status:   Future    Standing Expiration Date:   09/19/2022    Order Specific Question:   Release to patient    Answer:   Immediate   Hepatic function panel    Standing Status:   Future    Standing Expiration Date:   09/19/2022    Order Specific Question:   Release to patient    Answer:   Immediate

## 2021-09-19 NOTE — Progress Notes (Signed)
Patient ID: Wayne [name], male   DOB: 1941-03-11, 80 y.o.   MRN: [id number]   Advanced Heart Failure Clinic Note   Patient ID: Wayne [name], male   DOB: Jun 01, 1941, 80 y.o.   MRN: [id number] PCP: Dr. Elease Hashimoto Cardiology: Dr. Earline Mayotte [name] is a 80 y.o. male  with history of DM, HTN, CAD, and ischemic cardiomyopathy who presents for followup of CHF and CAD. Patient had a CHF exacerbation in 2/12 and was found to have LV systolic dysfunction with EF around 20%. LHC showed RCA, ramus, and CFX disease. RCA was subtotally occluded. Cardiac MRI showed that all walls, including the inferior wall, should be viable. Patient therefore underwent opening of his chronic totally occluded RCA as well as PCI to the ramus in 2/12. He received drug eluting stents and was on Effient.  He had an echo in 2/13 that showed EF improved to 45% with moderate LV dilation and mild LV hypertrophy.   In 5/13, he developed a large left lower leg hematoma.  He was still on Effient at that time.  ASA and Effient were stopped.  The hematoma did not resolve.  In 7/13, he was re-admitted with septic shock from MSSA from an abscess in his left gastrocnemius.  He also grew Pseudomonas from the left gastrocnemius as well.  He had a prolonged course in the hospital and later in a rehab facility.  Ultimately, he got back home again.  I had him get an echo in 1/14, and this showed EF 15% with diffuse hypokinesis and inferoposterior akinesis.  He had been off of most of his prior cardiac medications.  Took him back for [facility] in 1/14.  This showed subtotal occlusion of a small AV LCx and 95% ostial in-stent restenosis in the RCA.  He was treated in 2/14 with a Xience DES to the ostial RCA and begun on Plavix.  Unfortunately, he developed diffuse hives after starting Plavix and had to be switched to ticlopidine.  He has tolerated ticlopidine.   Echo done in 12/14 showed some improvement in LV function but EF was still low at 30-35%.  He did not want  ICD.    Presented to ED 11/14/15 with worsening SOB after a few steps and CXR with CHF and bilateral effusions in the setting of marked medical non-compliance. States he stopped taking his medicines in 01/2015 (except for ASA 81 and a multivitamin).  He was feeling good and decided that he did not need them anymore.  Also c/o URI symptoms. Diuresed over 2 L with IV diuretics in the ER and started on lasix 20 mg daily.   Echo (12/16) showed EF 20-25% with diffuse hypokinesis (down from 35% in 12/15).  Echo 10/17 showed EF 15% with mildly decreased RV systolic function.    He had left and right heart catheterization in 12/17. This showed elevated right and left heart filling pressures and low cardiac output. The RCA was totally occluded with collaterals, there was 95% ostial to mid LAD stenosis.  Patient had PCI with DES to proximal-mid LAD.    He was noted to be in atrial fibrillation with RVR in 2/18 at cardiac rehab. He felt fatigued.  He was admitted and started on amiodarone for rate control. ASA was stopped, ticagrelor was decreased to 60 mg bid, and Xarelto 15 mg daily was started.  He converted spontaneously back to NSR.    He suffered a ventricular fibrillation arrest in 3/18.  Luckily, he was wearing  a Lifevest and was shocked.  He was admitted and started on amiodarone.  Medtronic ICD was placed.  Echo 3/18 with EF 20-25%.   Admitted again 6/18 due to VF arrest. ICD lead had been undersensing leading to prolongation of time to discharge.  He underwent RV lead extraction with new lead implant.   He was admitted again with VT storm in 7/18. He had coronary angiography this admission.  Had cutting balloon angioplasty of severe in-stent restenosis in the proximal LAD.  He also had VT ablation in 7/18.   He had TURBT 11/18, urothelial cancer diagnosed.  TURBT again in 6/21.   Echo in 8/21 showed EF 25%, diffuse hypokinesis, moderate LV dilation, normal RV, IVC normal.   He had dilatation of  urethral stricture in 8/22.  He is off [place] due to UTI and recurrent urethral stricture.   He returns today for followup of CHF.  Main complaint is bilateral hip arthritis. He is taking NSAIDs at times.  He is doing PT for his hips.  He is using a walker due to hip pain. No dyspnea walking on flat ground but not very active due to his orthopedic issues.  No chest pain.   No lightheadedness.   Weight is down 2 lbs.    Labs (12/16): K 4.9, creatinine 1.23 Labs (1/17): K 4.7, creatinine 1.15, BNP 1433 Labs (2/17): K 4.7, creatinine 1.14, BNP 870 Labs (3/17): K 4.5, creatinine 1.10, BNP 1257 Labs (5/17): K 4.2, creatinine 1.2, BNP 818 Labs (7/17): LDL 149, HDL 40, TGs 210 Labs (9/17): K 4.9, creatinine 1.36 Labs (10/17): K 3.7, creatinine 1.2, LDL 87, HDL 32, LFTs normal Labs (12/17): K 4.5, creatinine 1.34, digoxin 0.3 Labs (2/18): K 4.4, creatinine 1.48, hgb 11.8 Labs (3/18): K 5 => 4.6, creatinine 1.54 => 1.46, digoxin 1.3 => 0.3 Labs (5/18): TSH normal Labs (7/18): K 3.7, creatinine 1.33, hgb 11.4, digoxin 0.6, LFTs normal Labs (8/18): K 5.1, creatinine 1.47, hgb 13.3 Labs (10/18): LDL 50, HDL 40, TGs 247 Labs (11/18): K 5.9, creatinine 1.42, hgb 15.2 Labs (12/18): K 5, creatinine 1.27, digoxin 0.6, LFTs normal, TSH normal Labs (3/19): LDL 86, HDL 45 Labs (6/19): K 4.1, creatinine 1.34, LFTs normal Labs (6/21): K 5, creatinine 1.3 Labs (8/21): K 5.1, creatinine 1.47, LDL 111, HDL 37 Labs (8/22): K 4.6, creatinine 1.31  Medtronic device interrogation: Stable thoracic impedance.   Past Medical History:  1. HYPERTENSION  2. HYPERLIPIDEMIA: Myalgias with atorvastatin and Crestor 3. ECZEMA  4. RHINITIS  5. HERPES ZOSTER OPHTHALMICUS  6. ADENOCARCINOMA, PROSTATE: Status post prostatectomy in 2009. Has had some incontinence since then.  7. Diabetes mellitus type II  8. Arthritis  9. Obesity  10. GERD: rare  11. CAD: Presented with exertional dyspnea, never had chest pain. LHC  (2/12) with subtotalled proximal RCA and left to right collaterals, 90% proximal moderate-sized ramus, 95% proximal relatively small CFX, 40-50% proximal LAD. Cardiac MRI (2/12) showed EF 21%, some mild scar in basal segments but all wall segments would be expected to be viable. DES x 5 (overlapping) to RCA, DES x 1 to RI 01/30/11 .  Bled into leg with Effient use (long, complicated course).  LHC (1/14): AV LCx small with subtotal occlusion, 95% ostial instent restenosis in RCA. PCI in 2/14 to ostial RCA with 3.5 x 15 Xience DES.  Plavix allergy (hives) so put on ticlopidine.  - LHC (12/17):  the RCA was totally occluded with collaterals, there was 95% ostial to mid LAD  stenosis, patient had PCI with DES to proximal-mid LAD. - LHC (7/18): Totally occluded RCA with left to right collaterals, subtotal occlusion of small AV LCx, 90% ISR proximal LAD => cutting balloon angioplasty.  12. Ischemic CMP: Echo (2/12) with moderately dilated LV, EF about 20% with diffuse hypokinesis and inferior akinesis, pseudonormal diastolic function, mild MR, severe LAE, mildly decreased RV systolic function. RHC (2/12) with mean RA 12, PA 40/25, mean PCWP 26, CI 2.1.  Echo (5/12) with EF 40% (appeared worse to my eye) with posterior HK, basal inferior AK, inferoseptal AK, basal anteroseptal AK, mild MR.  Cardiac MRI was repeated and showed EF 32% (improved from 21%) and mild LV dilation (was severely dilated before) with diffuse hypokinesis and subendocardial scar in the basal inferior, basal posterior, and basal anterolateral segments.  Echo (2/13) with EF 45%, moderate LV dilation, mild LVH.  Echo (1/14) with EF 15%, diffuse hypokinesis, inferoposterior akinesis, mild MR, grade I diastolic dysfunction.  Echo (5/14) with EF 30-35%, mild LV dilation, akinesis of the basal inferior wall otherwise diffuse hypokinesis.  Echo (12/14) with EF 30-35%, mild LV dilation, diffuse hypokinesis with basal inferior and posterior akinesis. Echo  (12/15) with EF 35%, mildly dilated LV, wall motion abnormalities, normal RV size and systolic function. Echo (12/16) with EF 20-25%, diffuse hypokinesis, severe LV dilation, mild MR.  - Echo (10/17): EF 15%, grade II diastolic dysfunction, mild MR, mildly decreased RV systolic function.  - Possible angioedema related to Entresto.  - Hyperkalemia with spironolactone 12.5 daily.  - CPX (10/17): peak VO2 10.6, VE/VCO2 slope 48.6, RER 1.12.  Severe HF limitation.  - RHC (12/17): mean RA 14, PA 53/27, mean PCWP 25, CI 1.93.  - Echo (3/18): EF 20-25%, moderate LV dilation, mild LVH, normal RV size and systolic function, mild aortic stenosis.  - Medtronic ICD 3/18.  - Echo (6/18): EF 25-30%, mild AS.  - Echo (7/19): Moderate LV dilation, EF 25-30%, diffuse hypokinesis with inferior AK, normal RV size and systolic function, mild AS.  - Echo (8/21): EF 25%, diffuse hypokinesis with moderate dilation, normal RV, IVC normal.  - CPX (8/21): peak VO2 10.2, VE/VCO2 slope 33, RER 1.0 => submaximal due to orthopedic issues, possibly mild HF limitation.  13. Cervical OA.  14. Polymyalgia rheumatica 15. OSA: Severe on sleep study 10/12.  On CPAP.  16. Left lower leg hematoma in setting of Effient use.  He developed a left gastrocnemius abscess and septic shock with prolonged hospitalization beginning in 7/13.  17. PAD: Lower extremity arterial doppler evaluation in 2/17 showed occluded peroneal arteries bilaterally, ABI 1.1 (R) and 1.0 (L).   - ABIs normal in 1/19.  18. Carotid dopplers (10/17) with minimal disease.  19. Atrial fibrillation: Paroxysmal.  20. Ventricular fibrillation arrest: 3/18.  Medtronic ICD placed, now on amiodarone.  - VT storm 7/18, now s/p VT ablation.  21. Aortic stenosis: Mild on 6/18 echo and 7/19 echo.  22. Urothelial cancer: s/p TURBT of bladder tumor 11/18 and 6/21.    Family History:  Father died with MI at age 61. He was an alcoholic. Mother died with cancer at around 104.    Social History:  Occupation: retired Administrator  Married, lives in [place]  Past smoker, quit around [place] of systems complete and found to be negative unless listed in HPI.      Current Outpatient Medications  Medication Sig Dispense Refill   amiodarone (PACERONE) 200 MG tablet Take 1 tablet (200 mg total)  by mouth daily. Except do not take on Sunday 30 tablet 6   carvedilol (COREG) 12.5 MG tablet Take 1 tablet (12.5 mg total) by mouth 2 (two) times daily with a meal. 60 tablet 11   diphenhydramine-acetaminophen (TYLENOL PM) 25-500 MG TABS tablet Take 1 tablet at bedtime as needed by mouth (for sleep).      ELIQUIS 5 MG TABS tablet TAKE 1 TABLET(5 MG) BY MOUTH TWICE DAILY 60 tablet 6   furosemide (LASIX) 20 MG tablet Take 1 tablet (20 mg total) by mouth 2 (two) times daily. 60 tablet 11   glucose blood (ACCU-CHEK AVIVA PLUS) test strip USE TO CHECK BLOOD SUGAR ONCE TO TWICE DAILY 200 strip 1   losartan (COZAAR) 25 MG tablet Take 1 tablet (25 mg total) by mouth in the morning and at bedtime. 60 tablet 6   Multiple Vitamin (MULTIVITAMIN WITH MINERALS) TABS tablet Take 1 tablet by mouth daily. Centrum Silver Adult 50+     Omega-3 Fatty Acids (FISH OIL) 1000 MG CAPS Take 1,000 mg by mouth 2 (two) times daily.      rosuvastatin (CRESTOR) 20 MG tablet Take 1 tablet (20 mg total) by mouth daily. 30 tablet 6   tamsulosin (FLOMAX) 0.4 MG CAPS capsule Take 0.4 mg by mouth every other day. At bedtime     TRUEplus Lancets 30G MISC USE TO CHECK BLOOD SUGAR EVERY DAY TO TWICE DAILY 100 each 1   spironolactone (ALDACTONE) 25 MG tablet Take 1 tablet (25 mg total) by mouth daily. 90 tablet 3   No current facility-administered medications for this encounter.    Vitals:   09/18/21 1155  BP: 138/70  Pulse: 62  SpO2: 96%  Weight: 104.1 kg (229 lb 6.4 oz)   Wt Readings from Last 3 Encounters:  09/18/21 104.1 kg (229 lb 6.4 oz)  08/06/21 104.3 kg (230 lb)  07/19/21 105 kg (231 lb 8 oz)    General: NAD Neck: No JVD, no thyromegaly or thyroid nodule.  Lungs: Clear to auscultation bilaterally with normal respiratory effort. CV: Nondisplaced PMI.  Heart regular S1/S2, no S3/S4, 1/6 SEM RUSB.  1+ ankle edema.  No carotid bruit.  Normal pedal pulses.  Abdomen: Soft, nontender, no hepatosplenomegaly, no distention.  Skin: Intact without lesions or rashes.  Neurologic: Alert and oriented x 3.  Psych: Normal affect. Extremities: No clubbing or cyanosis.  HEENT: Normal.   Assessment/Plan:  1. Chronic systolic CHF: Ischemic cardiomyopathy.  6/18 echo with EF 25-30%, diffuse hypokinesis.  CPX in 10/17 with severe HF limitation.  RHC in 12/17 with elevated filling pressures and low cardiac output.  Medtronic ICD in 3/18 after vfib arrest. He had VT again in 6/18, then VT storm in 7/18 now s/p VT ablation. Echo in 8/21 with EF 25% with normal RV (stable). CPX 8/21 was submaximal due to orthopedic issues, possible mild HF limitation.  NYHA class II-III symptoms, more limited by orthopedic pain than dyspnea. Not volume overloaded by exam or Optivol.  - Continue lasix 20 mg BID.  - Continue Coreg 12.5 mg bid.  - Increase spironolactone to 25 mg daily, BMET today and in 10 days.   - Continue losartan 25 mg bid. He had angioedema with Entresto.   - Off Farxiga due to urethral stricture with h/o UTI.  - LBBB present.  Have discussed with Dr. Lovena Le in the past and he has recommended to hold off CRT upgrade. - He may be an LVAD candidate in the future.   -  Repeat echo at followup in 3 months.  2. CAD:  S/p cutting balloon angioplasty to in-stent restenosis in proximal LAD in 7/18.  He has an occluded RCA but there were left to right collaterals.  No chest pain.  - He had been on Repatha but stopped because insurance was not covering.    - Continue Crestor 20 mg daily, check lipids.  - He is not on ASA given Xarelto use.  3. HLD: See discussion above.  4. PAD: Normal ABIs in 1/19. No  claudication.  5. Diabetes type II: Off Farxiga with UTI and urethral stricture.  6. Atrial fibrillation: NSR today on amiodarone 200 mg daily.  - Continue Xarelto 20 mg daily.  - Continue amiodarone 200 mg daily. Check LFTs/TSH.  He will need regular eye exams.  - Would consider atrial fibrillation ablation based on CASTLE-HF data if AF recurs. 7. Ventricular fibrillation arrest: On amiodarone and has Medtronic ICD.  He had VT ablation in 7/18 after episode of VT storm.  - Continue amiodarone.  8. OSA:  Not using CPAP, cannot tolerate full face mask.  He has severe OSA.   9. CKD: Stage 3.  - BMET today.  - Off Farxiga with urethral stricture and UTI.   Followup in 3 months with echo.   Loralie Champagne 09/19/2021

## 2021-09-20 DIAGNOSIS — M25551 Pain in right hip: Secondary | ICD-10-CM | POA: Diagnosis not present

## 2021-09-20 DIAGNOSIS — R278 Other lack of coordination: Secondary | ICD-10-CM | POA: Diagnosis not present

## 2021-09-20 DIAGNOSIS — M25552 Pain in left hip: Secondary | ICD-10-CM | POA: Diagnosis not present

## 2021-09-20 DIAGNOSIS — R2689 Other abnormalities of gait and mobility: Secondary | ICD-10-CM | POA: Diagnosis not present

## 2021-09-20 NOTE — Telephone Encounter (Signed)
Called [employer] to check the status of the patient's application. Representative Arbie Cookey) stated that submitted OOP reflects [phone], patient would have to spend an additional $372.15. Also, OOP for spouse was not included and would need to be sent in.

## 2021-09-23 NOTE — Progress Notes (Signed)
Remote ICD transmission.   

## 2021-09-25 DIAGNOSIS — R2689 Other abnormalities of gait and mobility: Secondary | ICD-10-CM | POA: Diagnosis not present

## 2021-09-25 DIAGNOSIS — M25552 Pain in left hip: Secondary | ICD-10-CM | POA: Diagnosis not present

## 2021-09-25 DIAGNOSIS — R278 Other lack of coordination: Secondary | ICD-10-CM | POA: Diagnosis not present

## 2021-09-25 DIAGNOSIS — M25551 Pain in right hip: Secondary | ICD-10-CM | POA: Diagnosis not present

## 2021-09-27 DIAGNOSIS — R278 Other lack of coordination: Secondary | ICD-10-CM | POA: Diagnosis not present

## 2021-09-27 DIAGNOSIS — M25552 Pain in left hip: Secondary | ICD-10-CM | POA: Diagnosis not present

## 2021-09-27 DIAGNOSIS — R2689 Other abnormalities of gait and mobility: Secondary | ICD-10-CM | POA: Diagnosis not present

## 2021-09-27 DIAGNOSIS — M25551 Pain in right hip: Secondary | ICD-10-CM | POA: Diagnosis not present

## 2021-09-30 ENCOUNTER — Ambulatory Visit (INDEPENDENT_AMBULATORY_CARE_PROVIDER_SITE_OTHER): Payer: Medicare Other

## 2021-09-30 DIAGNOSIS — R278 Other lack of coordination: Secondary | ICD-10-CM | POA: Diagnosis not present

## 2021-09-30 DIAGNOSIS — I5022 Chronic systolic (congestive) heart failure: Secondary | ICD-10-CM | POA: Diagnosis not present

## 2021-09-30 DIAGNOSIS — Z95810 Presence of automatic (implantable) cardiac defibrillator: Secondary | ICD-10-CM | POA: Diagnosis not present

## 2021-09-30 DIAGNOSIS — M25552 Pain in left hip: Secondary | ICD-10-CM | POA: Diagnosis not present

## 2021-09-30 DIAGNOSIS — R2689 Other abnormalities of gait and mobility: Secondary | ICD-10-CM | POA: Diagnosis not present

## 2021-09-30 DIAGNOSIS — M25551 Pain in right hip: Secondary | ICD-10-CM | POA: Diagnosis not present

## 2021-10-01 ENCOUNTER — Other Ambulatory Visit (HOSPITAL_COMMUNITY): Payer: Medicare Other

## 2021-10-02 DIAGNOSIS — M25551 Pain in right hip: Secondary | ICD-10-CM | POA: Diagnosis not present

## 2021-10-02 DIAGNOSIS — M25552 Pain in left hip: Secondary | ICD-10-CM | POA: Diagnosis not present

## 2021-10-02 DIAGNOSIS — R2689 Other abnormalities of gait and mobility: Secondary | ICD-10-CM | POA: Diagnosis not present

## 2021-10-02 DIAGNOSIS — R278 Other lack of coordination: Secondary | ICD-10-CM | POA: Diagnosis not present

## 2021-10-03 NOTE — Telephone Encounter (Signed)
Called and left the patient message with updated information from [employer].

## 2021-10-04 ENCOUNTER — Other Ambulatory Visit (HOSPITAL_COMMUNITY): Payer: Self-pay

## 2021-10-04 DIAGNOSIS — M25551 Pain in right hip: Secondary | ICD-10-CM | POA: Diagnosis not present

## 2021-10-04 DIAGNOSIS — R2689 Other abnormalities of gait and mobility: Secondary | ICD-10-CM | POA: Diagnosis not present

## 2021-10-04 DIAGNOSIS — M25552 Pain in left hip: Secondary | ICD-10-CM | POA: Diagnosis not present

## 2021-10-04 DIAGNOSIS — R278 Other lack of coordination: Secondary | ICD-10-CM | POA: Diagnosis not present

## 2021-10-04 NOTE — Progress Notes (Signed)
EPIC Encounter for ICM Monitoring  Patient Name: Wayne [name] is a 80 y.o. male Date: 10/04/2021 Primary Care Physican: [name], MD Primary Cardiologist: Aundra Dubin Electrophysiologist: Lovena Le 10/04/2021 Weight:  229-231 lbs (baseline)          Spoke with patient and heart failure questions reviewed.  Pt asymptomatic for fluid accumulation and feeling well.   Optivol thoracic impedance suggesting normal fluid levels.    Prescribed: Furosemide 20 mg take 1 tablet (20 mg total) by mouth twice a day. Potassium 20 mEq  take 1 tablet (20 mEq total) by mouth daily. Spironolactone 25 mg take 0.5 tablet (12.5 mg total) daily   Labs: 07/19/2021 Creatinine 1.31, BUN 18, Potassium 4.9, Sodium 140, GFR 56 07/03/2021 Creatinine 1.26, BUN 22, Potassium 4.0, Sodium 139, GFR 59 06/25/2021 Creatinine 1.20, BUN 15, Potassium 4.5, Sodium 140, GFR >60 05/11/2021 Creatinine 1.16, BUN 20, Potassium 4.6, Sodium 138, GFR >60  A complete set of results can be found in Results Review.   Recommendations:  No changes and encouraged to call if experiencing any fluid symptoms.   Follow-up plan: ICM clinic phone appointment on 11/11/2021.   91 day device clinic remote transmission 12/12/2021.     EP/Cardiology Office Visits: 12/24/2021 with Dr Aundra Dubin.  11/18/2021 with Dr Lovena Le   Copy of ICM check sent to Dr. Lovena Le.    3 month ICM trend: 09/30/2021.    1 Year ICM trend:       Rosalene Billings, RN 10/04/2021 11:29 AM

## 2021-10-07 DIAGNOSIS — R278 Other lack of coordination: Secondary | ICD-10-CM | POA: Diagnosis not present

## 2021-10-07 DIAGNOSIS — R2689 Other abnormalities of gait and mobility: Secondary | ICD-10-CM | POA: Diagnosis not present

## 2021-10-07 DIAGNOSIS — M25551 Pain in right hip: Secondary | ICD-10-CM | POA: Diagnosis not present

## 2021-10-07 DIAGNOSIS — M25552 Pain in left hip: Secondary | ICD-10-CM | POA: Diagnosis not present

## 2021-10-11 DIAGNOSIS — R278 Other lack of coordination: Secondary | ICD-10-CM | POA: Diagnosis not present

## 2021-10-11 DIAGNOSIS — M25552 Pain in left hip: Secondary | ICD-10-CM | POA: Diagnosis not present

## 2021-10-11 DIAGNOSIS — R2689 Other abnormalities of gait and mobility: Secondary | ICD-10-CM | POA: Diagnosis not present

## 2021-10-11 DIAGNOSIS — M25551 Pain in right hip: Secondary | ICD-10-CM | POA: Diagnosis not present

## 2021-10-14 DIAGNOSIS — R2689 Other abnormalities of gait and mobility: Secondary | ICD-10-CM | POA: Diagnosis not present

## 2021-10-14 DIAGNOSIS — M25552 Pain in left hip: Secondary | ICD-10-CM | POA: Diagnosis not present

## 2021-10-14 DIAGNOSIS — M25551 Pain in right hip: Secondary | ICD-10-CM | POA: Diagnosis not present

## 2021-10-14 DIAGNOSIS — R278 Other lack of coordination: Secondary | ICD-10-CM | POA: Diagnosis not present

## 2021-10-16 DIAGNOSIS — M25552 Pain in left hip: Secondary | ICD-10-CM | POA: Diagnosis not present

## 2021-10-16 DIAGNOSIS — R278 Other lack of coordination: Secondary | ICD-10-CM | POA: Diagnosis not present

## 2021-10-16 DIAGNOSIS — M25551 Pain in right hip: Secondary | ICD-10-CM | POA: Diagnosis not present

## 2021-10-16 DIAGNOSIS — R2689 Other abnormalities of gait and mobility: Secondary | ICD-10-CM | POA: Diagnosis not present

## 2021-10-23 DIAGNOSIS — R2689 Other abnormalities of gait and mobility: Secondary | ICD-10-CM | POA: Diagnosis not present

## 2021-10-23 DIAGNOSIS — M25552 Pain in left hip: Secondary | ICD-10-CM | POA: Diagnosis not present

## 2021-10-23 DIAGNOSIS — R278 Other lack of coordination: Secondary | ICD-10-CM | POA: Diagnosis not present

## 2021-10-23 DIAGNOSIS — M25551 Pain in right hip: Secondary | ICD-10-CM | POA: Diagnosis not present

## 2021-10-25 DIAGNOSIS — R278 Other lack of coordination: Secondary | ICD-10-CM | POA: Diagnosis not present

## 2021-10-25 DIAGNOSIS — M25552 Pain in left hip: Secondary | ICD-10-CM | POA: Diagnosis not present

## 2021-10-25 DIAGNOSIS — R2689 Other abnormalities of gait and mobility: Secondary | ICD-10-CM | POA: Diagnosis not present

## 2021-10-25 DIAGNOSIS — M25551 Pain in right hip: Secondary | ICD-10-CM | POA: Diagnosis not present

## 2021-10-28 DIAGNOSIS — M25552 Pain in left hip: Secondary | ICD-10-CM | POA: Diagnosis not present

## 2021-10-28 DIAGNOSIS — R278 Other lack of coordination: Secondary | ICD-10-CM | POA: Diagnosis not present

## 2021-10-28 DIAGNOSIS — R2689 Other abnormalities of gait and mobility: Secondary | ICD-10-CM | POA: Diagnosis not present

## 2021-10-28 DIAGNOSIS — M25551 Pain in right hip: Secondary | ICD-10-CM | POA: Diagnosis not present

## 2021-10-30 DIAGNOSIS — R278 Other lack of coordination: Secondary | ICD-10-CM | POA: Diagnosis not present

## 2021-10-30 DIAGNOSIS — M25552 Pain in left hip: Secondary | ICD-10-CM | POA: Diagnosis not present

## 2021-10-30 DIAGNOSIS — M25551 Pain in right hip: Secondary | ICD-10-CM | POA: Diagnosis not present

## 2021-10-30 DIAGNOSIS — R2689 Other abnormalities of gait and mobility: Secondary | ICD-10-CM | POA: Diagnosis not present

## 2021-11-01 DIAGNOSIS — R278 Other lack of coordination: Secondary | ICD-10-CM | POA: Diagnosis not present

## 2021-11-01 DIAGNOSIS — R2689 Other abnormalities of gait and mobility: Secondary | ICD-10-CM | POA: Diagnosis not present

## 2021-11-01 DIAGNOSIS — M25551 Pain in right hip: Secondary | ICD-10-CM | POA: Diagnosis not present

## 2021-11-01 DIAGNOSIS — M25552 Pain in left hip: Secondary | ICD-10-CM | POA: Diagnosis not present

## 2021-11-11 ENCOUNTER — Ambulatory Visit (INDEPENDENT_AMBULATORY_CARE_PROVIDER_SITE_OTHER): Payer: Medicare Other

## 2021-11-11 DIAGNOSIS — I5022 Chronic systolic (congestive) heart failure: Secondary | ICD-10-CM | POA: Diagnosis not present

## 2021-11-11 DIAGNOSIS — Z95810 Presence of automatic (implantable) cardiac defibrillator: Secondary | ICD-10-CM

## 2021-11-13 ENCOUNTER — Telehealth: Payer: Self-pay

## 2021-11-13 DIAGNOSIS — M25552 Pain in left hip: Secondary | ICD-10-CM | POA: Diagnosis not present

## 2021-11-13 DIAGNOSIS — R2689 Other abnormalities of gait and mobility: Secondary | ICD-10-CM | POA: Diagnosis not present

## 2021-11-13 DIAGNOSIS — M25551 Pain in right hip: Secondary | ICD-10-CM | POA: Diagnosis not present

## 2021-11-13 DIAGNOSIS — R278 Other lack of coordination: Secondary | ICD-10-CM | POA: Diagnosis not present

## 2021-11-13 NOTE — Telephone Encounter (Signed)
Remote ICM transmission received.  Attempted call to patient regarding ICM remote transmission and left detailed message per DPR.  Advised to return call for any fluid symptoms or questions. Next ICM remote transmission scheduled 12/18/2021.

## 2021-11-13 NOTE — Progress Notes (Signed)
EPIC Encounter for ICM Monitoring  Patient Name: Wayne [name] is a 80 y.o. male Date: 11/13/2021 Primary Care Physican: [name], MD Primary Cardiologist: Aundra Dubin Electrophysiologist: Lovena Le 10/04/2021 Weight:  229-231 lbs (baseline)         Attempted call to patient and unable to reach.  Left detailed message per DPR regarding transmission. Transmission reviewed.   Optivol thoracic impedance suggesting normal fluid levels.    Prescribed: Furosemide 20 mg take 1 tablet (20 mg total) by mouth twice a day. Potassium 20 mEq  take 1 tablet (20 mEq total) by mouth daily. Spironolactone 25 mg take 0.5 tablet (12.5 mg total) daily   Labs: 07/19/2021 Creatinine 1.31, BUN 18, Potassium 4.9, Sodium 140, GFR 56 07/03/2021 Creatinine 1.26, BUN 22, Potassium 4.0, Sodium 139, GFR 59 06/25/2021 Creatinine 1.20, BUN 15, Potassium 4.5, Sodium 140, GFR >60 05/11/2021 Creatinine 1.16, BUN 20, Potassium 4.6, Sodium 138, GFR >60  A complete set of results can be found in Results Review.   Recommendations:  Left voice mail with ICM number and encouraged to call if experiencing any fluid symptoms.   Follow-up plan: ICM clinic phone appointment on 12/18/2021.   91 day device clinic remote transmission 12/12/2021.     EP/Cardiology Office Visits: 12/24/2021 with Dr Aundra Dubin.  11/18/2021 with Dr Lovena Le   Copy of ICM check sent to Dr. Lovena Le.    3 month ICM trend: 11/11/2021.    12-14 Month ICM trend:       Rosalene Billings, RN 11/13/2021 3:35 PM

## 2021-11-18 ENCOUNTER — Encounter: Payer: Self-pay | Admitting: Internal Medicine

## 2021-11-18 ENCOUNTER — Other Ambulatory Visit: Payer: Self-pay

## 2021-11-18 ENCOUNTER — Ambulatory Visit: Payer: Medicare Other | Admitting: Internal Medicine

## 2021-11-18 DIAGNOSIS — R278 Other lack of coordination: Secondary | ICD-10-CM | POA: Diagnosis not present

## 2021-11-18 DIAGNOSIS — M25552 Pain in left hip: Secondary | ICD-10-CM | POA: Diagnosis not present

## 2021-11-18 DIAGNOSIS — M25551 Pain in right hip: Secondary | ICD-10-CM | POA: Diagnosis not present

## 2021-11-18 DIAGNOSIS — I255 Ischemic cardiomyopathy: Secondary | ICD-10-CM | POA: Diagnosis not present

## 2021-11-18 DIAGNOSIS — R2689 Other abnormalities of gait and mobility: Secondary | ICD-10-CM | POA: Diagnosis not present

## 2021-11-18 MED ORDER — FUROSEMIDE 20 MG PO TABS: 20.0000 mg | ORAL_TABLET | Freq: Every day | ORAL | 2 refills | Status: DC | PRN

## 2021-11-18 NOTE — Progress Notes (Signed)
HPI Wayne [name] returns today for follow-up.  He is a very pleasant 80 year old man with an extensive past medical history including chronic systolic heart failure and an ischemic cardiomyopathy.  The patient has a history of ventricular fibrillation cardiac arrest with prolonged downtime as his ICD under sensed his ventricular fibrillation.  The patient has a history of chronic systolic heart failure but his symptoms are currently improved.  Since beginning amiodarone, he has developed a slight tremor but now is well tolerated.  He denies chest pain.  He has class II heart failure symptoms. He has had no recent ICD therapies. Allergies  Allergen Reactions   Entresto [Sacubitril-Valsartan] Swelling and Other (See Comments)    Angioedema   Crestor [Rosuvastatin Calcium] Other (See Comments)    Myalgia, Interfering with Gait in high doses   Lipitor [Atorvastatin] Other (See Comments)    Makes legs sore   Plavix [Clopidogrel] Hives, Itching, Swelling and Other (See Comments)    Nose bleeds and welts on legs & back   Ancef [Cefazolin] Itching, Rash and Other (See Comments)    Describes itching and rash, but said that "it wasn't that bad"     Current Outpatient Medications  Medication Sig Dispense Refill   amiodarone (PACERONE) 200 MG tablet Take 1 tablet (200 mg total) by mouth daily. Except do not take on Sunday 30 tablet 6   carvedilol (COREG) 12.5 MG tablet Take 1 tablet (12.5 mg total) by mouth 2 (two) times daily with a meal. 60 tablet 11   diphenhydramine-acetaminophen (TYLENOL PM) 25-500 MG TABS tablet Take 1 tablet at bedtime as needed by mouth (for sleep).      ELIQUIS 5 MG TABS tablet TAKE 1 TABLET(5 MG) BY MOUTH TWICE DAILY 60 tablet 6   furosemide (LASIX) 20 MG tablet Take 1 tablet (20 mg total) by mouth 2 (two) times daily. 60 tablet 11   glucose blood (ACCU-CHEK AVIVA PLUS) test strip USE TO CHECK BLOOD SUGAR ONCE TO TWICE DAILY 200 strip 1   losartan (COZAAR) 25 MG tablet  Take 1 tablet (25 mg total) by mouth in the morning and at bedtime. 60 tablet 6   Multiple Vitamin (MULTIVITAMIN WITH MINERALS) TABS tablet Take 1 tablet by mouth daily. Centrum Silver Adult 50+     Omega-3 Fatty Acids (FISH OIL) 1000 MG CAPS Take 1,000 mg by mouth 2 (two) times daily.      rosuvastatin (CRESTOR) 40 MG tablet Take 1 tablet (40 mg total) by mouth daily. 90 tablet 3   spironolactone (ALDACTONE) 25 MG tablet Take 1 tablet (25 mg total) by mouth daily. 90 tablet 3   tamsulosin (FLOMAX) 0.4 MG CAPS capsule Take 0.4 mg by mouth every other day. At bedtime     TRUEplus Lancets 30G MISC USE TO CHECK BLOOD SUGAR EVERY DAY TO TWICE DAILY 100 each 1   No current facility-administered medications for this visit.     Past Medical History:  Diagnosis Date   AICD (automatic cardioverter/defibrillator) present    Arthritis    Bladder cancer ([place])    Cancer ([place])    bladder and prostate   Cellulitis of left leg    a. 06/4127 complicated by septic shock   Chronic systolic CHF (congestive heart failure) (HCC)    CKD (chronic kidney disease), stage III ([place])    pt denies   Colon polyps    CORONARY ATHEROSCLEROSIS NATIVE CORONARY ARTERY cardiologist-  dr Aundra Dubin   a. 01/2011 Cath/PCI: Verl Dicker  nl, LAD 40-50p, D1 80-small, LCX 95-small, RI 90, RCA 100, EF 20%;  b. 01/2011 Card MRI - No transmural scar;  c. 01/2011 PCI RCA->5 Promus DES, RI->3.0x16 Promus DES; d. Cath 01/13/13 patent LAD & Ramus, diffuse LCx dz, RCA mult overlapping stents w/ 95% osital stenosis, EF 20%, s/p DES to ostial/prox RCA 01/24/13    Diabetes mellitus, type 2 ([place])    type 2   Hematoma of leg    a. left leg hematoma 03/2012 in the setting of asa/effient   Herpes zoster ophthalmicus    neuropathy   History of kidney stones    History of non-ST elevation myocardial infarction (NSTEMI) 06/2012   History of prostate cancer    dx 2009---  s/p radioactive seed implants 11-24-2008   HYPERLIPIDEMIA    intolerant to Lipitor  (myalgias)   HYPERTENSION    Ischemic cardiomyopathy    a. 01/2012 Echo EF 45%, mild LVH; b. CW23%, grade 1 diastolic dysfunction, diffuse hypokinesis, inferoposterior akinesis    Myocardial infarction ([place])    x2   Noncompliance    Obesity    OSA (obstructive sleep apnea)    dx 2012  severe osa  no cpap just uses nose strips   PAF (paroxysmal atrial fibrillation) ([place])    first dx 01-16-2017   Polymyalgia rheumatica ([place])    Prostate cancer ([place])    S/P drug eluting coronary stent placement    total 6 DES    ROS:   All systems reviewed and negative except as noted in the HPI.   Past Surgical History:  Procedure Laterality Date   CARDIAC CATHETERIZATION  01/24/2013   CARDIAC CATHETERIZATION N/A 11/14/2016   Procedure: Right/Left Heart Cath and Coronary Angiography;  Surgeon: Larey Dresser, MD;  Location: [place] [facility];  Service: Cardiovascular;  Laterality: N/A;   CARDIAC CATHETERIZATION N/A 11/17/2016   Procedure: Coronary Stent Intervention w/Impella;  Surgeon: Peter M Martinique, MD;  Location: [place] [facility];  Service: Cardiovascular;  Laterality: N/A;   CARDIAC CATHETERIZATION N/A 11/17/2016   Procedure: Coronary Atherectomy;  Surgeon: Peter M Martinique, MD;  Location: [facility];  Service: Cardiovascular;  Laterality: N/A;   CORONARY ANGIOPLASTY WITH STENT PLACEMENT  01-30-2011   dr Aundra Dubin   PTCA and multiple overlapping DEStenting to RCA and 1 DES to RI    CORONARY BALLOON ANGIOPLASTY N/A 07/06/2017   Procedure: Coronary Balloon Angioplasty;  Surgeon: Sherren Mocha, MD;  Location: [facility];  Service: Cardiovascular;  Laterality: N/A;   CYSTOSCOPY WITH FULGERATION N/A 05/25/2018   Procedure: CYSTOSCOPY WITH FULGERATION/BLADDER BIOPSY , 15 mm lesion;  Surgeon: Festus Aloe, MD;  Location: [facility];  Service: Urology;  Laterality: N/A;   CYSTOSCOPY WITH URETHRAL DILATATION N/A 08/09/2021   Procedure: CYSTOSCOPY WITH URETHRAL DILATATION WITH OPTILUME;   Surgeon: Festus Aloe, MD;  Location: [facility];  Service: Urology;  Laterality: N/A;   I & D EXTREMITY  06/15/2012   Procedure: IRRIGATION AND DEBRIDEMENT EXTREMITY;  Surgeon: Newt Minion, MD;  Location: [facility];  Service: Orthopedics;  Laterality: Left;  I&D Left Posterior Knee   I & D EXTREMITY  06/30/2012   Procedure: IRRIGATION AND DEBRIDEMENT EXTREMITY;  Surgeon: Newt Minion, MD;  Location: [place];  Service: Orthopedics;  Laterality: Left;  Left Leg Irrigation and Debridement and placement of Wound VAC and application of  A-cell   I & D EXTREMITY  07/20/2012   Procedure: IRRIGATION AND DEBRIDEMENT EXTREMITY;  Surgeon: Illene Regulus  Sharol Given, MD;  Location: [facility];  Service: Orthopedics;  Laterality: Left;  Irrigation and Debridement Left Leg and Place antibiotic beads    ICD IMPLANT N/A 02/12/2017   Procedure: ICD Implant;  Surgeon: Evans Lance, MD;  Location: [place] [facility];  Service: Cardiovascular;  Laterality: N/A;   LAPAROSCOPIC INGUINAL HERNIA REPAIR Right 09-11-2008   dr Barry Dienes  [facility]   LEAD REVISION/REPAIR N/A 05/26/2017   Procedure: Lead Revision/Repair;  Surgeon: Evans Lance, MD;  Location: [place] [facility];  Service: Cardiovascular;  Laterality: N/A;   LEFT HEART CATH AND CORONARY ANGIOGRAPHY N/A 07/06/2017   Procedure: Left Heart Cath and Coronary Angiography;  Surgeon: Larey Dresser, MD;  Location: [place] [facility];  Service: Cardiovascular;  Laterality: N/A;   PERCUTANEOUS CORONARY STENT INTERVENTION (PCI-S) N/A 01/24/2013   Procedure: PERCUTANEOUS CORONARY STENT INTERVENTION (PCI-S);  Surgeon: [place] Hampshire, MD;  Location[facility];  Service: Cardiovascular;  Laterality: N/A;   RADIOACTIVE PROSTATE SEED IMPLANTS  11-24-2008   dr Tresa Endo  [place] TUMOR N/A 10/30/2017   Procedure: TRANSURETHRAL RESECTION OF BLADDER TUMOR (TURBT)/ INSTILLATION OF EPIRUBICIN;  Surgeon: Festus Aloe, MD;  [facility];  Service: Urology;   Laterality: N/A;   TRANSURETHRAL RESECTION OF BLADDER TUMOR N/A 05/18/2020   Procedure: TRANSURETHRAL RESECTION OF BLADDER TUMOR (TURBT) 0.5-2.0 cm BILATERAL RETROGRADE PYELOGRAM;  Surgeon: Festus Aloe, MD;  [facility];  Service: Urology;  Laterality: N/A;   ULTRASOUND GUIDANCE FOR VASCULAR ACCESS  11/14/2016   Procedure: Ultrasound Guidance For Vascular Access;  Surgeon: Larey Dresser, MD;  Location: [place] [facility];  Service: Cardiovascular;;   V TACH ABLATION N/A 06/19/2017   Procedure: V Tach Ablation;  Surgeon: Evans Lance, MD;  Location: [place] [facility];  Service: Cardiovascular;  Laterality: N/A;     Family History  Problem Relation Age of Onset   Cancer Mother 21       unknown CA   Heart disease Father    Alcohol abuse Father    Heart attack Father 81   Heart attack Brother      Social History   Socioeconomic History   Marital status: Married    Spouse name: Not on file   Number of children: 1   Years of education: Not on file   Highest education level: Not on file  Occupational History   Occupation: RETIRED    Employer: RETIRED    Comment: TRUCK DRIVER  Tobacco Use   Smoking status: Former    Packs/day: 0.30    Years: 20.00    Pack years: 6.00    Types: Cigarettes    Quit date: 02/23/1989    Years since quitting: 32.7   Smokeless tobacco: Never  Vaping Use   Vaping Use: Never used  Substance and Sexual Activity   Alcohol use: No   Drug use: No   Sexual activity: Yes  Other Topics Concern   Not on file  Social History Narrative   Retired Administrator   Social Determinants of Radio broadcast assistant Strain: Not on file  Food Insecurity: Not on file  Transportation Needs: Not on file  Physical Activity: Not on file  Stress: Not on file  Social Connections: Not on file  Intimate Partner Violence: Not on file     BP 124/70   Pulse 65   Ht 5\' 8"  (1.727 m)   Wt 227 lb 3.2 oz (103.1  kg)   SpO2 96%   BMI 34.55 kg/m    Physical Exam:  Well appearing NAD HEENT: Unremarkable Neck:  No JVD, no thyromegally Lymphatics:  No adenopathy Back:  No CVA tenderness Lungs:  Clear with no wheezes HEART:  Regular rate rhythm, no murmurs, no rubs, no clicks Abd:  soft, positive bowel sounds, no organomegally, no rebound, no guarding Ext:  2 plus pulses, no edema, no cyanosis, no clubbing Skin:  No rashes no nodules Neuro:  CN II through XII intact, motor grossly intact  DEVICE  Normal device function.  See PaceArt for details.   Assess/Plan:  1. VF - he has had no recurrent symptoms. He will continue amiodarone. I hope to lower the dose next year if he is free from VT. 2. ICD -his medtronic ICD is working normally. 3. Chronic systolic heart failure - his symptoms are class 2. He will continue his current meds. 4. Dyslipidemia - continue crestor.    Wayne [name].D.

## 2021-11-18 NOTE — Patient Instructions (Signed)
Medication Instructions:  Your physician recommends that you continue on your current medications as directed. Please refer to the Current Medication list given to you today.  *If you need a refill on your cardiac medications before your next appointment, please call your pharmacy*   Lab Work: None ordered   Testing/Procedures: None ordered   Follow-Up: At [facility], you and your health needs are our priority.  As part of our continuing mission to provide you with exceptional heart care, we have created designated Provider Care Teams.  These Care Teams include your primary Cardiologist (physician) and Advanced Practice Providers (APPs -  Physician Assistants and Nurse Practitioners) who all work together to provide you with the care you need, when you need it.  We recommend signing up for the patient portal called "MyChart".  Sign up information is provided on this After Visit Summary.  MyChart is used to connect with patients for Virtual Visits (Telemedicine).  Patients are able to view lab/test results, encounter notes, upcoming appointments, etc.  Non-urgent messages can be sent to your provider as well.   To learn more about what you can do with MyChart, go to [email or web address].    Remote monitoring is used to monitor your Pacemaker or ICD from home. This monitoring reduces the number of office visits required to check your device to one time per year. It allows [place] to keep an eye on the functioning of your device to ensure it is working properly. You are scheduled for a device check from home on 12/12/2021. You may send your transmission at any time that day. If you have a wireless device, the transmission will be sent automatically. After your physician reviews your transmission, you will receive a postcard with your next transmission date.  Your next appointment:   1 year(s)  The format for your next appointment:   In Person  Provider:   [name], MD   Thank you  for choosing [facility]!!   [phone]

## 2021-11-20 DIAGNOSIS — M25552 Pain in left hip: Secondary | ICD-10-CM | POA: Diagnosis not present

## 2021-11-20 DIAGNOSIS — R278 Other lack of coordination: Secondary | ICD-10-CM | POA: Diagnosis not present

## 2021-11-20 DIAGNOSIS — M25551 Pain in right hip: Secondary | ICD-10-CM | POA: Diagnosis not present

## 2021-11-20 DIAGNOSIS — R2689 Other abnormalities of gait and mobility: Secondary | ICD-10-CM | POA: Diagnosis not present

## 2021-11-22 DIAGNOSIS — R2689 Other abnormalities of gait and mobility: Secondary | ICD-10-CM | POA: Diagnosis not present

## 2021-11-22 DIAGNOSIS — R278 Other lack of coordination: Secondary | ICD-10-CM | POA: Diagnosis not present

## 2021-11-22 DIAGNOSIS — M25551 Pain in right hip: Secondary | ICD-10-CM | POA: Diagnosis not present

## 2021-11-22 DIAGNOSIS — M25552 Pain in left hip: Secondary | ICD-10-CM | POA: Diagnosis not present

## 2021-11-25 DIAGNOSIS — M25552 Pain in left hip: Secondary | ICD-10-CM | POA: Diagnosis not present

## 2021-11-25 DIAGNOSIS — M25551 Pain in right hip: Secondary | ICD-10-CM | POA: Diagnosis not present

## 2021-11-25 DIAGNOSIS — R2689 Other abnormalities of gait and mobility: Secondary | ICD-10-CM | POA: Diagnosis not present

## 2021-11-25 DIAGNOSIS — R278 Other lack of coordination: Secondary | ICD-10-CM | POA: Diagnosis not present

## 2021-12-02 DIAGNOSIS — R2689 Other abnormalities of gait and mobility: Secondary | ICD-10-CM | POA: Diagnosis not present

## 2021-12-02 DIAGNOSIS — M25551 Pain in right hip: Secondary | ICD-10-CM | POA: Diagnosis not present

## 2021-12-02 DIAGNOSIS — M25552 Pain in left hip: Secondary | ICD-10-CM | POA: Diagnosis not present

## 2021-12-02 DIAGNOSIS — R278 Other lack of coordination: Secondary | ICD-10-CM | POA: Diagnosis not present

## 2021-12-12 ENCOUNTER — Ambulatory Visit (INDEPENDENT_AMBULATORY_CARE_PROVIDER_SITE_OTHER): Payer: Medicare Other

## 2021-12-12 DIAGNOSIS — I255 Ischemic cardiomyopathy: Secondary | ICD-10-CM | POA: Diagnosis not present

## 2021-12-12 DIAGNOSIS — I5022 Chronic systolic (congestive) heart failure: Secondary | ICD-10-CM

## 2021-12-12 LAB — CUP PACEART REMOTE DEVICE CHECK
Battery Remaining Longevity: 50 mo
Battery Voltage: 2.98 V
Brady Statistic RV Percent Paced: 0.01 %
Date Time Interrogation Session: 20221229033523
HighPow Impedance: 78 Ohm
Implantable Lead Implant Date: 20180612
Implantable Lead Location: 753860
Implantable Lead Model: 6935
Implantable Pulse Generator Implant Date: 20180103
Lead Channel Impedance Value: 342 Ohm
Lead Channel Impedance Value: 437 Ohm
Lead Channel Pacing Threshold Amplitude: 1 V
Lead Channel Pacing Threshold Pulse Width: 0.4 ms
Lead Channel Sensing Intrinsic Amplitude: 10.75 mV
Lead Channel Sensing Intrinsic Amplitude: 10.75 mV
Lead Channel Setting Pacing Amplitude: 2.5 V
Lead Channel Setting Pacing Pulse Width: 0.4 ms
Lead Channel Setting Sensing Sensitivity: 0.3 mV

## 2021-12-18 ENCOUNTER — Ambulatory Visit (INDEPENDENT_AMBULATORY_CARE_PROVIDER_SITE_OTHER): Payer: Medicare Other

## 2021-12-18 DIAGNOSIS — I5022 Chronic systolic (congestive) heart failure: Secondary | ICD-10-CM | POA: Diagnosis not present

## 2021-12-18 DIAGNOSIS — Z95810 Presence of automatic (implantable) cardiac defibrillator: Secondary | ICD-10-CM

## 2021-12-20 NOTE — Progress Notes (Signed)
EPIC Encounter for ICM Monitoring  Patient Name: Wayne [name] is a 81 y.o. male Date: 12/20/2021 Primary Care Physican: [name], MD Primary Cardiologist: Aundra Dubin Electrophysiologist: Lovena Le 11/18/2021 Weight:  227 lbs (baseline)         Spoke with patient and heart failure questions reviewed.  Pt asymptomatic for fluid accumulation.  Reports feeling well at this time and voices no complaints.    Optivol thoracic impedance suggesting normal fluid levels.    Prescribed: Furosemide 20 mg take 1 tablet (20 mg total) by mouth twice a day. Potassium 20 mEq  take 1 tablet (20 mEq total) by mouth daily. Spironolactone 25 mg take 0.5 tablet (12.5 mg total) daily   Labs: 07/19/2021 Creatinine 1.31, BUN 18, Potassium 4.9, Sodium 140, GFR 56 07/03/2021 Creatinine 1.26, BUN 22, Potassium 4.0, Sodium 139, GFR 59 06/25/2021 Creatinine 1.20, BUN 15, Potassium 4.5, Sodium 140, GFR >60 05/11/2021 Creatinine 1.16, BUN 20, Potassium 4.6, Sodium 138, GFR >60  A complete set of results can be found in Results Review.   Recommendations:  No changes and encouraged to call if experiencing any fluid symptoms.   Follow-up plan: ICM clinic phone appointment on 01/20/2022.   91 day device clinic remote transmission 03/13/2022.Marland Kitchen     EP/Cardiology Office Visits: 12/24/2021 with Dr Aundra Dubin.     Copy of ICM check sent to Dr. Lovena Le.    3 month ICM trend: 12/17/2021.    12-14 Month ICM trend:     Rosalene Billings, RN 12/20/2021 4:14 PM

## 2021-12-24 ENCOUNTER — Encounter (HOSPITAL_COMMUNITY): Payer: Medicare Other | Admitting: Cardiology

## 2021-12-24 ENCOUNTER — Ambulatory Visit (HOSPITAL_COMMUNITY): Payer: Medicare Other

## 2021-12-25 NOTE — Progress Notes (Signed)
Remote ICD transmission.   

## 2022-01-20 ENCOUNTER — Ambulatory Visit (INDEPENDENT_AMBULATORY_CARE_PROVIDER_SITE_OTHER): Payer: Medicare Other

## 2022-01-20 DIAGNOSIS — Z95810 Presence of automatic (implantable) cardiac defibrillator: Secondary | ICD-10-CM | POA: Diagnosis not present

## 2022-01-20 DIAGNOSIS — I5022 Chronic systolic (congestive) heart failure: Secondary | ICD-10-CM | POA: Diagnosis not present

## 2022-01-22 ENCOUNTER — Telehealth: Payer: Self-pay

## 2022-01-22 NOTE — Telephone Encounter (Signed)
Remote ICM transmission received.  Attempted call to patient regarding ICM remote transmission and left detailed message per DPR.  Advised to return call for any fluid symptoms or questions. Next ICM remote transmission scheduled 02/24/2022.   ° °

## 2022-01-22 NOTE — Progress Notes (Signed)
EPIC Encounter for ICM Monitoring  Patient Name: Wayne [name] is a 81 y.o. male Date: 01/22/2022 Primary Care Physican: [name], MD Primary Cardiologist: Aundra Dubin Electrophysiologist: Lovena Le 11/18/2021 Weight:  227 lbs (baseline)         Attempted call to patient and unable to reach.  Left detailed message per DPR regarding transmission. Transmission reviewed.    Optivol thoracic impedance suggesting normal fluid levels.    Prescribed: Furosemide 20 mg take 1 tablet (20 mg total) by mouth twice a day. Potassium 20 mEq  take 1 tablet (20 mEq total) by mouth daily. Spironolactone 25 mg take 0.5 tablet (12.5 mg total) daily   Labs: 07/19/2021 Creatinine 1.31, BUN 18, Potassium 4.9, Sodium 140, GFR 56 07/03/2021 Creatinine 1.26, BUN 22, Potassium 4.0, Sodium 139, GFR 59 06/25/2021 Creatinine 1.20, BUN 15, Potassium 4.5, Sodium 140, GFR >60 05/11/2021 Creatinine 1.16, BUN 20, Potassium 4.6, Sodium 138, GFR >60  A complete set of results can be found in Results Review.   Recommendations:  Left voice mail with ICM number and encouraged to call if experiencing any fluid symptoms.   Follow-up plan: ICM clinic phone appointment on 02/24/2022.   91 day device clinic remote transmission 03/13/2022.Marland Kitchen     EP/Cardiology Office Visits:   Pt canceled 12/24/2021 with Dr Aundra Dubin (and echo) and has not rescheduled.  Recall 11/13/2022 with Dr Lovena Le.   Copy of ICM check sent to Dr. Lovena Le.    3 month ICM trend: 01/20/2022.    12-14 Month ICM trend:     Rosalene Billings, RN 01/22/2022 1:33 PM

## 2022-02-13 ENCOUNTER — Telehealth (HOSPITAL_COMMUNITY): Payer: Self-pay | Admitting: Vascular Surgery

## 2022-02-13 NOTE — Telephone Encounter (Signed)
Lvm giving pt f/u appt w/ Wayne [name] ?

## 2022-02-24 ENCOUNTER — Ambulatory Visit (INDEPENDENT_AMBULATORY_CARE_PROVIDER_SITE_OTHER): Payer: Medicare Other

## 2022-02-24 ENCOUNTER — Telehealth: Payer: Self-pay

## 2022-02-24 DIAGNOSIS — I5022 Chronic systolic (congestive) heart failure: Secondary | ICD-10-CM | POA: Diagnosis not present

## 2022-02-24 DIAGNOSIS — Z95810 Presence of automatic (implantable) cardiac defibrillator: Secondary | ICD-10-CM | POA: Diagnosis not present

## 2022-02-24 NOTE — Progress Notes (Signed)
EPIC Encounter for ICM Monitoring ? ?Patient Name: Wayne [name] is a 81 y.o. male ?Date: 02/24/2022 ?Primary Care Physican: [name], MD ?Primary Cardiologist: Aundra Dubin ?Electrophysiologist: Lovena Le ?11/18/2021 Weight:  227 lbs (baseline) ?  ?      Attempted call to patient and unable to reach.  Left detailed message per DPR regarding transmission. Transmission reviewed.  ?  ?Optivol thoracic impedance suggesting possible ongoing fluid accumulation starting 3/3.  ?  ?Prescribed: ?Furosemide 20 mg take 1 tablet (20 mg total) by mouth twice a day. ?Potassium 20 mEq  take 1 tablet (20 mEq total) by mouth daily. ?Spironolactone 25 mg take 0.5 tablet (12.5 mg total) daily ?  ?Labs: ?09/18/2021 Creatinine 1.38, BUN 25, Potassium 4.8, Sodium 138, GFR 53 ?07/30/2021 Creatinine 1.31, BUN 20, Potassium 4.6, Sodium 138, GFR 56 ?07/19/2021 Creatinine 1.31, BUN 18, Potassium 4.9, Sodium 140, GFR 56 ?A complete set of results can be found in Results Review. ?  ?Recommendations:  Left voice mail with ICM number and encouraged to call if experiencing any fluid symptoms. ?  ?Follow-up plan: ICM clinic phone appointment on 03/10/2022 to recheck fluid levels (device fluid levels will be checked at 3/14 OV).   91 day device clinic remote transmission 03/13/2022.   ?  ?EP/Cardiology Office Visits:  02/25/2022 with Dr Aundra Dubin.  Recall 11/13/2022 with Dr Lovena Le. ?  ?Copy of ICM check sent to Dr. Lovena Le.   ? ?3 month ICM trend: 02/24/2022. ? ? ? ?12-14 Month ICM trend:  ? ? ? ?Rosalene Billings, RN ?02/24/2022 ?12:00 PM ? ?

## 2022-02-24 NOTE — Telephone Encounter (Signed)
Remote ICM transmission received.  Attempted call to patient regarding ICM remote transmission and left detailed message per DPR.  Advised to return call for any fluid symptoms or questions. Next ICM remote transmission scheduled 03/10/2022.   ? ?

## 2022-02-25 ENCOUNTER — Other Ambulatory Visit: Payer: Self-pay

## 2022-02-25 ENCOUNTER — Ambulatory Visit (HOSPITAL_COMMUNITY)
Admission: RE | Admit: 2022-02-25 | Discharge: 2022-02-25 | Disposition: A | Discharge status: Home / Self Care | Payer: Medicare Other | Source: Ambulatory Visit | Attending: Cardiology | Admitting: Cardiology

## 2022-02-25 ENCOUNTER — Encounter: Payer: Medicare Other | Admitting: *Deleted

## 2022-02-25 ENCOUNTER — Encounter (HOSPITAL_COMMUNITY): Payer: Self-pay | Admitting: Cardiology

## 2022-02-25 VITALS — BP 122/70 | HR 60 | Wt 234.1 lb

## 2022-02-25 VITALS — BP 122/70 | HR 60 | Wt 234.8 lb

## 2022-02-25 DIAGNOSIS — N35919 Unspecified urethral stricture, male, unspecified site: Secondary | ICD-10-CM | POA: Insufficient documentation

## 2022-02-25 DIAGNOSIS — Z955 Presence of coronary angioplasty implant and graft: Secondary | ICD-10-CM | POA: Diagnosis not present

## 2022-02-25 DIAGNOSIS — Z79899 Other long term (current) drug therapy: Secondary | ICD-10-CM | POA: Diagnosis not present

## 2022-02-25 DIAGNOSIS — Z8674 Personal history of sudden cardiac arrest: Secondary | ICD-10-CM | POA: Diagnosis not present

## 2022-02-25 DIAGNOSIS — E1122 Type 2 diabetes mellitus with diabetic chronic kidney disease: Secondary | ICD-10-CM | POA: Diagnosis not present

## 2022-02-25 DIAGNOSIS — R9431 Abnormal electrocardiogram [ECG] [EKG]: Secondary | ICD-10-CM | POA: Insufficient documentation

## 2022-02-25 DIAGNOSIS — I2510 Atherosclerotic heart disease of native coronary artery without angina pectoris: Secondary | ICD-10-CM | POA: Diagnosis not present

## 2022-02-25 DIAGNOSIS — I480 Paroxysmal atrial fibrillation: Secondary | ICD-10-CM | POA: Diagnosis not present

## 2022-02-25 DIAGNOSIS — Z9079 Acquired absence of other genital organ(s): Secondary | ICD-10-CM | POA: Insufficient documentation

## 2022-02-25 DIAGNOSIS — I5022 Chronic systolic (congestive) heart failure: Secondary | ICD-10-CM | POA: Insufficient documentation

## 2022-02-25 DIAGNOSIS — I472 Ventricular tachycardia, unspecified: Secondary | ICD-10-CM | POA: Diagnosis not present

## 2022-02-25 DIAGNOSIS — N183 Chronic kidney disease, stage 3 unspecified: Secondary | ICD-10-CM | POA: Insufficient documentation

## 2022-02-25 DIAGNOSIS — E785 Hyperlipidemia, unspecified: Secondary | ICD-10-CM | POA: Diagnosis not present

## 2022-02-25 DIAGNOSIS — Z8249 Family history of ischemic heart disease and other diseases of the circulatory system: Secondary | ICD-10-CM | POA: Insufficient documentation

## 2022-02-25 DIAGNOSIS — Z006 Encounter for examination for normal comparison and control in clinical research program: Secondary | ICD-10-CM

## 2022-02-25 DIAGNOSIS — I255 Ischemic cardiomyopathy: Secondary | ICD-10-CM | POA: Diagnosis not present

## 2022-02-25 DIAGNOSIS — Z8551 Personal history of malignant neoplasm of bladder: Secondary | ICD-10-CM | POA: Insufficient documentation

## 2022-02-25 DIAGNOSIS — Z95810 Presence of automatic (implantable) cardiac defibrillator: Secondary | ICD-10-CM | POA: Insufficient documentation

## 2022-02-25 DIAGNOSIS — I447 Left bundle-branch block, unspecified: Secondary | ICD-10-CM | POA: Diagnosis not present

## 2022-02-25 DIAGNOSIS — E1151 Type 2 diabetes mellitus with diabetic peripheral angiopathy without gangrene: Secondary | ICD-10-CM | POA: Diagnosis not present

## 2022-02-25 DIAGNOSIS — G4733 Obstructive sleep apnea (adult) (pediatric): Secondary | ICD-10-CM | POA: Diagnosis not present

## 2022-02-25 DIAGNOSIS — Z7901 Long term (current) use of anticoagulants: Secondary | ICD-10-CM | POA: Diagnosis not present

## 2022-02-25 DIAGNOSIS — R001 Bradycardia, unspecified: Secondary | ICD-10-CM | POA: Insufficient documentation

## 2022-02-25 DIAGNOSIS — Z7902 Long term (current) use of antithrombotics/antiplatelets: Secondary | ICD-10-CM | POA: Diagnosis not present

## 2022-02-25 DIAGNOSIS — R0601 Orthopnea: Secondary | ICD-10-CM | POA: Insufficient documentation

## 2022-02-25 DIAGNOSIS — Z87440 Personal history of urinary (tract) infections: Secondary | ICD-10-CM | POA: Diagnosis not present

## 2022-02-25 DIAGNOSIS — I130 Hypertensive heart and chronic kidney disease with heart failure and stage 1 through stage 4 chronic kidney disease, or unspecified chronic kidney disease: Secondary | ICD-10-CM | POA: Insufficient documentation

## 2022-02-25 DIAGNOSIS — Z809 Family history of malignant neoplasm, unspecified: Secondary | ICD-10-CM | POA: Insufficient documentation

## 2022-02-25 LAB — COMPREHENSIVE METABOLIC PANEL
ALT: 26 U/L (ref 0–44)
AST: 29 U/L (ref 15–41)
Albumin: 3.6 g/dL (ref 3.5–5.0)
Alkaline Phosphatase: 74 U/L (ref 38–126)
Anion gap: 7 (ref 5–15)
BUN: 19 mg/dL (ref 8–23)
CO2: 28 mmol/L (ref 22–32)
Calcium: 9.3 mg/dL (ref 8.9–10.3)
Chloride: 106 mmol/L (ref 98–111)
Creatinine, Ser: 1.44 mg/dL — ABNORMAL HIGH (ref 0.61–1.24)
GFR, Estimated: 50 mL/min — ABNORMAL LOW (ref >60)
Glucose, Bld: 197 mg/dL — ABNORMAL HIGH (ref 70–99)
Potassium: 5.6 mmol/L — ABNORMAL HIGH (ref 3.5–5.1)
Sodium: 141 mmol/L (ref 135–145)
Total Bilirubin: 1.1 mg/dL (ref 0.3–1.2)
Total Protein: 6.7 g/dL (ref 6.5–8.1)

## 2022-02-25 LAB — LIPID PANEL
Cholesterol: 125 mg/dL (ref 0–200)
HDL: 35 mg/dL — ABNORMAL LOW (ref >40)
LDL Cholesterol: 71 mg/dL (ref 0–99)
Total CHOL/HDL Ratio: 3.6 RATIO
Triglycerides: 93 mg/dL (ref <150)
VLDL: 19 mg/dL (ref 0–40)

## 2022-02-25 LAB — TSH: TSH: 0.023 u[IU]/mL — ABNORMAL LOW (ref 0.350–4.500)

## 2022-02-25 LAB — BRAIN NATRIURETIC PEPTIDE: B Natriuretic Peptide: 1948.6 pg/mL — ABNORMAL HIGH (ref 0.0–100.0)

## 2022-02-25 MED ORDER — FUROSEMIDE 20 MG PO TABS: 20.0000 mg | ORAL_TABLET | Freq: Every day | ORAL | 3 refills | Status: DC

## 2022-02-25 NOTE — Patient Instructions (Signed)
Medication Changes: ? ?Increase Lasix to 20 mg Twice daily for 4 days, then start 20 mg daily. ? ?Lab Work: ? ?Labs done today, your results will be available in MyChart, we will contact you for abnormal readings. ? ? ?Testing/Procedures: ? ?Your physician has requested that you have an echocardiogram. Echocardiography is a painless test that uses sound waves to create images of your heart. It provides your doctor with information about the size and shape of your heart and how well your heart?s chambers and valves are working. This procedure takes approximately one hour. There are no restrictions for this procedure. ? ?Repeat blood work in 10 days ? ?Referrals: ? ?none ? ?Special Instructions // Education: ? ?PLEASE TAKE ALL MEDICATIONS AS PRESCRIBED BY YOUR PROVIDER ? ?Follow-Up in: 3 weeks  ? ?At the [facility], you and your health needs are our priority. We have a designated team specialized in the treatment of Heart Failure. This Care Team includes your primary Heart Failure Specialized Cardiologist (physician), Advanced Practice Providers (APPs- Physician Assistants and Nurse Practitioners), and Pharmacist who all work together to provide you with the care you need, when you need it.  ? ?You may see any of the following providers on your designated Care Team at your next follow up: ? ?Dr Glori Bickers ?Dr Loralie Champagne ?Darrick Grinder, NP ?Lyda Jester, PA ?Jessica Milford,NP ?Marlyce Huge, PA ?Audry Riles, PharmD ? ? ?Please be sure to bring in all your medications bottles to every appointment.  ? ?Need to Contact us: ? ?If you have any questions or concerns before your next appointment please send [place] a message through [place] or call our office at [phone].   ? ?TO LEAVE A MESSAGE FOR THE NURSE SELECT OPTION 2, PLEASE LEAVE A MESSAGE INCLUDING: ?YOUR NAME ?DATE OF BIRTH ?CALL BACK NUMBER ?REASON FOR CALL**this is important as we prioritize the call backs ? ?YOU WILL RECEIVE A CALL  BACK THE SAME DAY AS LONG AS YOU CALL BEFORE 4:00 PM ? ? ?

## 2022-02-25 NOTE — Progress Notes (Signed)
Patient ID: Wayne [name], male   DOB: 06/16/1941, 81 y.o.   MRN: [id number] ?  ?Advanced Heart Failure Clinic Note  ? ?Patient ID: Wayne [name], male   DOB: 12-19-1940, 81 y.o.   MRN: [id number] ?PCP: Dr. Elease Hashimoto ?Cardiology: Dr. Aundra Dubin ? ?Wayne [name] is a 81 y.o. male  with history of DM, HTN, CAD, and ischemic cardiomyopathy who presents for followup of CHF and CAD. Patient had a CHF exacerbation in 2/12 and was found to have LV systolic dysfunction with EF around 20%. LHC showed RCA, ramus, and CFX disease. RCA was subtotally occluded. Cardiac MRI showed that all walls, including the inferior wall, should be viable. Patient therefore underwent opening of his chronic totally occluded RCA as well as PCI to the ramus in 2/12. He received drug eluting stents and was on Effient.  He had an echo in 2/13 that showed EF improved to 45% with moderate LV dilation and mild LV hypertrophy.  ? ?In 5/13, he developed a large left lower leg hematoma.  He was still on Effient at that time.  ASA and Effient were stopped.  The hematoma did not resolve.  In 7/13, he was re-admitted with septic shock from MSSA from an abscess in his left gastrocnemius.  He also grew Pseudomonas from the left gastrocnemius as well.  He had a prolonged course in the hospital and later in a rehab facility.  Ultimately, he got back home again.  I had him get an echo in 1/14, and this showed EF 15% with diffuse hypokinesis and inferoposterior akinesis.  He had been off of most of his prior cardiac medications.  Took him back for [facility] in 1/14.  This showed subtotal occlusion of a small AV LCx and 95% ostial in-stent restenosis in the RCA.  He was treated in 2/14 with a Xience DES to the ostial RCA and begun on Plavix.  Unfortunately, he developed diffuse hives after starting Plavix and had to be switched to ticlopidine.  He has tolerated ticlopidine.   Echo done in 12/14 showed some improvement in LV function but EF was still low at 30-35%.  He did not want  ICD.   ? ?Presented to ED 11/14/15 with worsening SOB after a few steps and CXR with CHF and bilateral effusions in the setting of marked medical non-compliance. States he stopped taking his medicines in 01/2015 (except for ASA 81 and a multivitamin).  He was feeling good and decided that he did not need them anymore.  Also c/o URI symptoms. Diuresed over 2 L with IV diuretics in the ER and started on lasix 20 mg daily.  ? ?Echo (12/16) showed EF 20-25% with diffuse hypokinesis (down from 35% in 12/15).  Echo 10/17 showed EF 15% with mildly decreased RV systolic function.   ? ?He had left and right heart catheterization in 12/17. This showed elevated right and left heart filling pressures and low cardiac output. The RCA was totally occluded with collaterals, there was 95% ostial to mid LAD stenosis.  Patient had PCI with DES to proximal-mid LAD.   ? ?He was noted to be in atrial fibrillation with RVR in 2/18 at cardiac rehab. He felt fatigued.  He was admitted and started on amiodarone for rate control. ASA was stopped, ticagrelor was decreased to 60 mg bid, and Xarelto 15 mg daily was started.  He converted spontaneously back to NSR.   ? ?He suffered a ventricular fibrillation arrest in 3/18.  Luckily, he was wearing  a Lifevest and was shocked.  He was admitted and started on amiodarone.  Medtronic ICD was placed.  Echo 3/18 with EF 20-25%.  ? ?Admitted again 6/18 due to VF arrest. ICD lead had been undersensing leading to prolongation of time to discharge.  He underwent RV lead extraction with new lead implant.  ? ?He was admitted again with VT storm in 7/18. He had coronary angiography this admission.  Had cutting balloon angioplasty of severe in-stent restenosis in the proximal LAD.  He also had VT ablation in 7/18.  ? ?He had TURBT 11/18, urothelial cancer diagnosed.  TURBT again in 6/21.  ? ?Echo in 8/21 showed EF 25%, diffuse hypokinesis, moderate LV dilation, normal RV, IVC normal.  ? ?He had dilatation of  urethral stricture in 8/22.  He is off [place] due to UTI and recurrent urethral stricture.  ? ?He returns today for followup of CHF.  Weight is up 5 lbs.  He has been more short of breath for about 2 months.  He is dyspneic after walking about 200 feet.  He has orthopnea and has had a couple of PND episodes.  No chest pain. He has not taken Lasix regularly for a couple of months now.  He does not like to have to go to the bathroom multiple times after he takes Lasix.    ? ?ECG (personally reviewed): NSR, LBBB 146 msec ? ?Labs (12/16): K 4.9, creatinine 1.23 ?Labs (1/17): K 4.7, creatinine 1.15, BNP 1433 ?Labs (2/17): K 4.7, creatinine 1.14, BNP 870 ?Labs (3/17): K 4.5, creatinine 1.10, BNP 1257 ?Labs (5/17): K 4.2, creatinine 1.2, BNP 818 ?Labs (7/17): LDL 149, HDL 40, TGs 210 ?Labs (9/17): K 4.9, creatinine 1.36 ?Labs (10/17): K 3.7, creatinine 1.2, LDL 87, HDL 32, LFTs normal ?Labs (12/17): K 4.5, creatinine 1.34, digoxin 0.3 ?Labs (2/18): K 4.4, creatinine 1.48, hgb 11.8 ?Labs (3/18): K 5 => 4.6, creatinine 1.54 => 1.46, digoxin 1.3 => 0.3 ?Labs (5/18): TSH normal ?Labs (7/18): K 3.7, creatinine 1.33, hgb 11.4, digoxin 0.6, LFTs normal ?Labs (8/18): K 5.1, creatinine 1.47, hgb 13.3 ?Labs (10/18): LDL 50, HDL 40, TGs 247 ?Labs (11/18): K 5.9, creatinine 1.42, hgb 15.2 ?Labs (12/18): K 5, creatinine 1.27, digoxin 0.6, LFTs normal, TSH normal ?Labs (3/19): LDL 86, HDL 45 ?Labs (6/19): K 4.1, creatinine 1.34, LFTs normal ?Labs (6/21): K 5, creatinine 1.3 ?Labs (8/21): K 5.1, creatinine 1.47, LDL 111, HDL 37 ?Labs (8/22): K 4.6, creatinine 1.31 ?Labs (10/22): K 4.8, creatinine 1.38, TSH normal, LFTs normal ? ?Medtronic device interrogation: fluid index close to threshold with decreasing thoracic impedance.  ? ?Past Medical History:  ?1. HYPERTENSION  ?2. HYPERLIPIDEMIA: Myalgias with atorvastatin and Crestor ?3. ECZEMA  ?4. RHINITIS  ?5. HERPES ZOSTER OPHTHALMICUS  ?6. ADENOCARCINOMA, PROSTATE: Status post  prostatectomy in 2009. Has had some incontinence since then.  ?7. Diabetes mellitus type II  ?8. Arthritis  ?9. Obesity  ?10. GERD: rare  ?11. CAD: Presented with exertional dyspnea, never had chest pain. LHC (2/12) with subtotalled proximal RCA and left to right collaterals, 90% proximal moderate-sized ramus, 95% proximal relatively small CFX, 40-50% proximal LAD. Cardiac MRI (2/12) showed EF 21%, some mild scar in basal segments but all wall segments would be expected to be viable. DES x 5 (overlapping) to RCA, DES x 1 to RI 01/30/11 .  Bled into leg with Effient use (long, complicated course).  LHC (1/14): AV LCx small with subtotal occlusion, 95% ostial instent restenosis in RCA. PCI in  2/14 to ostial RCA with 3.5 x 15 Xience DES.  Plavix allergy (hives) so put on ticlopidine.  ?- LHC (12/17):  the RCA was totally occluded with collaterals, there was 95% ostial to mid LAD stenosis, patient had PCI with DES to proximal-mid LAD. ?- LHC (7/18): Totally occluded RCA with left to right collaterals, subtotal occlusion of small AV LCx, 90% ISR proximal LAD => cutting balloon angioplasty.  ?12. Ischemic CMP: Echo (2/12) with moderately dilated LV, EF about 20% with diffuse hypokinesis and inferior akinesis, pseudonormal diastolic function, mild MR, severe LAE, mildly decreased RV systolic function. RHC (2/12) with mean RA 12, PA 40/25, mean PCWP 26, CI 2.1.  Echo (5/12) with EF 40% (appeared worse to my eye) with posterior HK, basal inferior AK, inferoseptal AK, basal anteroseptal AK, mild MR.  Cardiac MRI was repeated and showed EF 32% (improved from 21%) and mild LV dilation (was severely dilated before) with diffuse hypokinesis and subendocardial scar in the basal inferior, basal posterior, and basal anterolateral segments.  Echo (2/13) with EF 45%, moderate LV dilation, mild LVH.  Echo (1/14) with EF 15%, diffuse hypokinesis, inferoposterior akinesis, mild MR, grade I diastolic dysfunction.  Echo (5/14) with EF  30-35%, mild LV dilation, akinesis of the basal inferior wall otherwise diffuse hypokinesis.  Echo (12/14) with EF 30-35%, mild LV dilation, diffuse hypokinesis with basal inferior and posterior akinesis. Echo (12/15

## 2022-02-26 ENCOUNTER — Telehealth (HOSPITAL_COMMUNITY): Payer: Self-pay

## 2022-02-26 DIAGNOSIS — I5022 Chronic systolic (congestive) heart failure: Secondary | ICD-10-CM

## 2022-02-26 NOTE — Telephone Encounter (Addendum)
Pt aware, agreeable, and verbalized understanding ? ? ?----- Message from Larey Dresser, MD sent at 02/26/2022 12:10 AM EDT ----- ?K is high.  Hopefully with Lasix, it will come down.  Would repeat BMET sooner (early next week). 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  Please have him get repeat TSH as well as free T3 and free T4 asap.  ?

## 2022-02-28 NOTE — Research (Signed)
EOS visit.  ? ?Beat HF  Informed Consent  ? ?Subject Name: Wayne [name] ? ?Subject met inclusion and exclusion criteria.  The informed consent form, study requirements and expectations were reviewed with the subject and questions and concerns were addressed prior to the signing of the consent form.  The subject verbalized understanding of the trial requirements.  The subject agreed to participate in the Beat HF trial and signed the informed consent on 02/25/2022.  The informed consent was obtained prior to performance of any protocol-specific procedures for the subject.  A copy of the signed informed consent was given to the subject and a copy was placed in the subject's medical record.  ? ?Re-consented to Rev H ?Philemon Kingdom D ? ? ? ?

## 2022-03-03 NOTE — Research (Signed)
EOS  ? ?Patient was seen while he was seeing Dr Aundra Dubin for his EOS visit for Beat HF.  ?Patient is doing well.  ?Spoke with patient about the Batwire trial.  ?He was sent home with information to decide if he wants to roll into the BatWire study.  ?No med changes  ? ? ? ?

## 2022-03-04 ENCOUNTER — Encounter: Payer: Self-pay | Admitting: *Deleted

## 2022-03-04 DIAGNOSIS — Z006 Encounter for examination for normal comparison and control in clinical research program: Secondary | ICD-10-CM

## 2022-03-04 NOTE — Research (Signed)
Spoke with patient this am  ? ?Question was he still going to PT with [facility]? Patient states he quit bc he didn't think it was helping.  ?He said the pain/discomfort is not as bad as it was.  ? ?Asked patient about Batwire and he said that he wasn't interested at this time. I told him if he ever wanted to follow up just to let Dr Aundra Dubin know and he could get in touch with me about the research study.  ? ?He was very appreciated of the information.  ?

## 2022-03-10 ENCOUNTER — Other Ambulatory Visit: Payer: Self-pay

## 2022-03-10 ENCOUNTER — Ambulatory Visit (HOSPITAL_COMMUNITY)
Admission: RE | Admit: 2022-03-10 | Discharge: 2022-03-10 | Disposition: A | Discharge status: Home / Self Care | Payer: Medicare Other | Source: Ambulatory Visit | Attending: Cardiology | Admitting: Cardiology

## 2022-03-10 ENCOUNTER — Ambulatory Visit (INDEPENDENT_AMBULATORY_CARE_PROVIDER_SITE_OTHER): Payer: Medicare Other

## 2022-03-10 DIAGNOSIS — Z95810 Presence of automatic (implantable) cardiac defibrillator: Secondary | ICD-10-CM

## 2022-03-10 DIAGNOSIS — I255 Ischemic cardiomyopathy: Secondary | ICD-10-CM | POA: Diagnosis not present

## 2022-03-10 DIAGNOSIS — I5022 Chronic systolic (congestive) heart failure: Secondary | ICD-10-CM | POA: Insufficient documentation

## 2022-03-10 LAB — BASIC METABOLIC PANEL
Anion gap: 7 (ref 5–15)
BUN: 29 mg/dL — ABNORMAL HIGH (ref 8–23)
CO2: 26 mmol/L (ref 22–32)
Calcium: 9.3 mg/dL (ref 8.9–10.3)
Chloride: 109 mmol/L (ref 98–111)
Creatinine, Ser: 1.58 mg/dL — ABNORMAL HIGH (ref 0.61–1.24)
GFR, Estimated: 44 mL/min — ABNORMAL LOW (ref >60)
Glucose, Bld: 274 mg/dL — ABNORMAL HIGH (ref 70–99)
Potassium: 5.6 mmol/L — ABNORMAL HIGH (ref 3.5–5.1)
Sodium: 142 mmol/L (ref 135–145)

## 2022-03-10 LAB — T4, FREE: Free T4: 1.91 ng/dL — ABNORMAL HIGH (ref 0.61–1.12)

## 2022-03-10 LAB — TSH: TSH: 0.033 u[IU]/mL — ABNORMAL LOW (ref 0.350–4.500)

## 2022-03-10 NOTE — Progress Notes (Signed)
EPIC Encounter for ICM Monitoring ? ?Patient Name: Wayne [name] is a 81 y.o. male ?Date: 03/10/2022 ?Primary Care Physican: [name], MD ?Primary Cardiologist: Aundra Dubin ?Electrophysiologist: Lovena Le ?11/18/2021 Weight:  227 lbs (baseline) ?  ?      Transmission reviewed. ?  ?Optivol thoracic impedance suggesting fluid levels returned to normal.  ?  ?Prescribed: ?Furosemide 20 mg take 1 tablet (20 mg total) by mouth twice a day. ?Potassium 20 mEq  take 1 tablet (20 mEq total) by mouth daily. ?Spironolactone 25 mg take 0.5 tablet (12.5 mg total) daily ?  ?Labs: ?03/10/2022 BMET scheduled ?02/25/2022 Creatinine 1.41, BUN 19, Potassium 5.6, Sodium 141, GFR 50 ?A complete set of results can be found in Results Review. ?  ?Recommendations:  No changes. ?  ?Follow-up plan: ICM clinic phone appointment on 03/31/2022.   91 day device clinic remote transmission 06/12/2022.   ?  ?EP/Cardiology Office Visits:  03/18/2022 with HF clinic.  Recall 11/13/2022 with Dr Lovena Le. ?  ?Copy of ICM check sent to Dr. Lovena Le.   ? ?3 month ICM trend: 03/10/2022. ? ? ? ?12-14 Month ICM trend:  ? ? ? ?Rosalene Billings, RN ?03/10/2022 ?10:20 AM ? ?

## 2022-03-11 LAB — T3, FREE: T3, Free: 3.2 pg/mL (ref 2.0–4.4)

## 2022-03-13 ENCOUNTER — Ambulatory Visit (INDEPENDENT_AMBULATORY_CARE_PROVIDER_SITE_OTHER): Payer: Medicare Other

## 2022-03-13 ENCOUNTER — Telehealth (HOSPITAL_COMMUNITY): Payer: Self-pay

## 2022-03-13 DIAGNOSIS — I255 Ischemic cardiomyopathy: Secondary | ICD-10-CM

## 2022-03-13 DIAGNOSIS — E032 Hypothyroidism due to medicaments and other exogenous substances: Secondary | ICD-10-CM

## 2022-03-13 LAB — CUP PACEART REMOTE DEVICE CHECK
Battery Remaining Longevity: 52 mo
Battery Voltage: 2.97 V
Brady Statistic RV Percent Paced: 0.01 %
Date Time Interrogation Session: 20230330033424
HighPow Impedance: 66 Ohm
Implantable Lead Implant Date: 20180612
Implantable Lead Location: 753860
Implantable Lead Model: 6935
Implantable Pulse Generator Implant Date: 20180103
Lead Channel Impedance Value: 342 Ohm
Lead Channel Impedance Value: 399 Ohm
Lead Channel Pacing Threshold Amplitude: 1 V
Lead Channel Pacing Threshold Pulse Width: 0.4 ms
Lead Channel Sensing Intrinsic Amplitude: 10.125 mV
Lead Channel Sensing Intrinsic Amplitude: 10.125 mV
Lead Channel Setting Pacing Amplitude: 2.5 V
Lead Channel Setting Pacing Pulse Width: 0.4 ms
Lead Channel Setting Sensing Sensitivity: 0.3 mV

## 2022-03-13 MED ORDER — METHIMAZOLE 10 MG PO TABS: 10.0000 mg | ORAL_TABLET | Freq: Every day | ORAL | 3 refills | Status: DC

## 2022-03-13 MED ORDER — MEXILETINE HCL 150 MG PO CAPS: 150.0000 mg | ORAL_CAPSULE | Freq: Two times a day (BID) | ORAL | 3 refills | Status: DC

## 2022-03-13 MED ORDER — LOKELMA 10 G PO PACK: PACK | ORAL | 2 refills | Status: DC

## 2022-03-13 NOTE — Telephone Encounter (Signed)
Patient advised and verbalized understanding. Patient will have repeat labs done at next ov on 03/18/22 since he just received his results today. Med list updated to reflect changes.  ? ?Meds ordered this encounter  ?Medications  ? sodium zirconium cyclosilicate (LOKELMA) 10 g PACK packet  ?  Sig: Take 1/2 a pack daily  ?  Dispense:  30 each  ?  Refill:  2  ? mexiletine (MEXITIL) 150 MG capsule  ?  Sig: Take 1 capsule (150 mg total) by mouth 2 (two) times daily.  ?  Dispense:  180 capsule  ?  Refill:  3  ?  D/C Amiodarone  ? methimazole (TAPAZOLE) 10 MG tablet  ?  Sig: Take 1 tablet (10 mg total) by mouth daily.  ?  Dispense:  90 tablet  ?  Refill:  3  ? ?Orders Placed This Encounter  ?Procedures  ? Ambulatory referral to Endocrinology  ?  Referral Priority:   Urgent  ?  Referral Type:   Consultation  ?  Referral Reason:   Specialty Services Required  ?  Number of Visits Requested:   1  ? ? ?

## 2022-03-13 NOTE — Telephone Encounter (Signed)
-----   Message from Larey Dresser, MD sent at 03/10/2022 10:03 PM EDT ----- ?1. Hyperthyroidism.  Stop amiodarone.  Start mexiletine 150 mg bid instead.  Start methimazole 10 mg daily.  I would like him to see [facility] endocrinology ASAP (expedite appt).  ?2. K remains high.  I would like him to stay on spironolactone.  Start lokelma 5 g daily.  BMET Friday.  ?

## 2022-03-17 DIAGNOSIS — R310 Gross hematuria: Secondary | ICD-10-CM | POA: Diagnosis not present

## 2022-03-17 DIAGNOSIS — R311 Benign essential microscopic hematuria: Secondary | ICD-10-CM | POA: Diagnosis not present

## 2022-03-17 NOTE — Progress Notes (Incomplete)
Patient ID: Wayne [name], male   DOB: 04/29/1941, 81 y.o.   MRN: [id number] ?  ?Advanced Heart Failure Clinic Note  ? ?Patient ID: Wayne [name], male   DOB: 1941-08-16, 81 y.o.   MRN: [id number] ?PCP: Dr. Elease Hashimoto ?Cardiology: Dr. Aundra Dubin ? ?Wayne [name] is a 81 y.o. male  with history of DM, HTN, CAD, and ischemic cardiomyopathy who presents for followup of CHF and CAD. Patient had a CHF exacerbation in 2/12 and was found to have LV systolic dysfunction with EF around 20%. LHC showed RCA, ramus, and CFX disease. RCA was subtotally occluded. Cardiac MRI showed that all walls, including the inferior wall, should be viable. Patient therefore underwent opening of his chronic totally occluded RCA as well as PCI to the ramus in 2/12. He received drug eluting stents and was on Effient.  He had an echo in 2/13 that showed EF improved to 45% with moderate LV dilation and mild LV hypertrophy.  ? ?In 5/13, he developed a large left lower leg hematoma.  He was still on Effient at that time.  ASA and Effient were stopped.  The hematoma did not resolve.  In 7/13, he was re-admitted with septic shock from MSSA from an abscess in his left gastrocnemius.  He also grew Pseudomonas from the left gastrocnemius as well.  He had a prolonged course in the hospital and later in a rehab facility.  Ultimately, he got back home again.  I had him get an echo in 1/14, and this showed EF 15% with diffuse hypokinesis and inferoposterior akinesis.  He had been off of most of his prior cardiac medications.  Took him back for [facility] in 1/14.  This showed subtotal occlusion of a small AV LCx and 95% ostial in-stent restenosis in the RCA.  He was treated in 2/14 with a Xience DES to the ostial RCA and begun on Plavix.  Unfortunately, he developed diffuse hives after starting Plavix and had to be switched to ticlopidine.  He has tolerated ticlopidine.   Echo done in 12/14 showed some improvement in LV function but EF was still low at 30-35%.  He did not want  ICD.   ? ?Presented to ED 11/14/15 with worsening SOB after a few steps and CXR with CHF and bilateral effusions in the setting of marked medical non-compliance. States he stopped taking his medicines in 01/2015 (except for ASA 81 and a multivitamin).  He was feeling good and decided that he did not need them anymore.  Also c/o URI symptoms. Diuresed over 2 L with IV diuretics in the ER and started on lasix 20 mg daily.  ? ?Echo (12/16) showed EF 20-25% with diffuse hypokinesis (down from 35% in 12/15).  Echo 10/17 showed EF 15% with mildly decreased RV systolic function.   ? ?He had left and right heart catheterization in 12/17. This showed elevated right and left heart filling pressures and low cardiac output. The RCA was totally occluded with collaterals, there was 95% ostial to mid LAD stenosis.  Patient had PCI with DES to proximal-mid LAD.   ? ?He was noted to be in atrial fibrillation with RVR in 2/18 at cardiac rehab. He felt fatigued.  He was admitted and started on amiodarone for rate control. ASA was stopped, ticagrelor was decreased to 60 mg bid, and Xarelto 15 mg daily was started.  He converted spontaneously back to NSR.   ? ?He suffered a ventricular fibrillation arrest in 3/18.  Luckily, he was wearing  a Lifevest and was shocked.  He was admitted and started on amiodarone.  Medtronic ICD was placed.  Echo 3/18 with EF 20-25%.  ? ?Admitted again 6/18 due to VF arrest. ICD lead had been undersensing leading to prolongation of time to discharge.  He underwent RV lead extraction with new lead implant.  ? ?He was admitted again with VT storm in 7/18. He had coronary angiography this admission.  Had cutting balloon angioplasty of severe in-stent restenosis in the proximal LAD.  He also had VT ablation in 7/18.  ? ?He had TURBT 11/18, urothelial cancer diagnosed.  TURBT again in 6/21.  ? ?Echo in 8/21 showed EF 25%, diffuse hypokinesis, moderate LV dilation, normal RV, IVC normal.  ? ?He had dilatation of  urethral stricture in 8/22.  He is off [place] due to UTI and recurrent urethral stricture.  ? ?He returns today for followup of CHF.  Weight is up 5 lbs.  He has been more short of breath for about 2 months.  He is dyspneic after walking about 200 feet.  He has orthopnea and has had a couple of PND episodes.  No chest pain. He has not taken Lasix regularly for a couple of months now.  He does not like to have to go to the bathroom multiple times after he takes Lasix.    ? ?ECG (personally reviewed): NSR, LBBB 146 msec ? ?Labs (12/16): K 4.9, creatinine 1.23 ?Labs (1/17): K 4.7, creatinine 1.15, BNP 1433 ?Labs (2/17): K 4.7, creatinine 1.14, BNP 870 ?Labs (3/17): K 4.5, creatinine 1.10, BNP 1257 ?Labs (5/17): K 4.2, creatinine 1.2, BNP 818 ?Labs (7/17): LDL 149, HDL 40, TGs 210 ?Labs (9/17): K 4.9, creatinine 1.36 ?Labs (10/17): K 3.7, creatinine 1.2, LDL 87, HDL 32, LFTs normal ?Labs (12/17): K 4.5, creatinine 1.34, digoxin 0.3 ?Labs (2/18): K 4.4, creatinine 1.48, hgb 11.8 ?Labs (3/18): K 5 => 4.6, creatinine 1.54 => 1.46, digoxin 1.3 => 0.3 ?Labs (5/18): TSH normal ?Labs (7/18): K 3.7, creatinine 1.33, hgb 11.4, digoxin 0.6, LFTs normal ?Labs (8/18): K 5.1, creatinine 1.47, hgb 13.3 ?Labs (10/18): LDL 50, HDL 40, TGs 247 ?Labs (11/18): K 5.9, creatinine 1.42, hgb 15.2 ?Labs (12/18): K 5, creatinine 1.27, digoxin 0.6, LFTs normal, TSH normal ?Labs (3/19): LDL 86, HDL 45 ?Labs (6/19): K 4.1, creatinine 1.34, LFTs normal ?Labs (6/21): K 5, creatinine 1.3 ?Labs (8/21): K 5.1, creatinine 1.47, LDL 111, HDL 37 ?Labs (8/22): K 4.6, creatinine 1.31 ?Labs (10/22): K 4.8, creatinine 1.38, TSH normal, LFTs normal ? ?Medtronic device interrogation: fluid index close to threshold with decreasing thoracic impedance.  ? ?Past Medical History:  ?1. HYPERTENSION  ?2. HYPERLIPIDEMIA: Myalgias with atorvastatin and Crestor ?3. ECZEMA  ?4. RHINITIS  ?5. HERPES ZOSTER OPHTHALMICUS  ?6. ADENOCARCINOMA, PROSTATE: Status post  prostatectomy in 2009. Has had some incontinence since then.  ?7. Diabetes mellitus type II  ?8. Arthritis  ?9. Obesity  ?10. GERD: rare  ?11. CAD: Presented with exertional dyspnea, never had chest pain. LHC (2/12) with subtotalled proximal RCA and left to right collaterals, 90% proximal moderate-sized ramus, 95% proximal relatively small CFX, 40-50% proximal LAD. Cardiac MRI (2/12) showed EF 21%, some mild scar in basal segments but all wall segments would be expected to be viable. DES x 5 (overlapping) to RCA, DES x 1 to RI 01/30/11 .  Bled into leg with Effient use (long, complicated course).  LHC (1/14): AV LCx small with subtotal occlusion, 95% ostial instent restenosis in RCA. PCI in  2/14 to ostial RCA with 3.5 x 15 Xience DES.  Plavix allergy (hives) so put on ticlopidine.  ?- LHC (12/17):  the RCA was totally occluded with collaterals, there was 95% ostial to mid LAD stenosis, patient had PCI with DES to proximal-mid LAD. ?- LHC (7/18): Totally occluded RCA with left to right collaterals, subtotal occlusion of small AV LCx, 90% ISR proximal LAD => cutting balloon angioplasty.  ?12. Ischemic CMP: Echo (2/12) with moderately dilated LV, EF about 20% with diffuse hypokinesis and inferior akinesis, pseudonormal diastolic function, mild MR, severe LAE, mildly decreased RV systolic function. RHC (2/12) with mean RA 12, PA 40/25, mean PCWP 26, CI 2.1.  Echo (5/12) with EF 40% (appeared worse to my eye) with posterior HK, basal inferior AK, inferoseptal AK, basal anteroseptal AK, mild MR.  Cardiac MRI was repeated and showed EF 32% (improved from 21%) and mild LV dilation (was severely dilated before) with diffuse hypokinesis and subendocardial scar in the basal inferior, basal posterior, and basal anterolateral segments.  Echo (2/13) with EF 45%, moderate LV dilation, mild LVH.  Echo (1/14) with EF 15%, diffuse hypokinesis, inferoposterior akinesis, mild MR, grade I diastolic dysfunction.  Echo (5/14) with EF  30-35%, mild LV dilation, akinesis of the basal inferior wall otherwise diffuse hypokinesis.  Echo (12/14) with EF 30-35%, mild LV dilation, diffuse hypokinesis with basal inferior and posterior akinesis. Echo (12/15

## 2022-03-18 ENCOUNTER — Encounter (HOSPITAL_COMMUNITY): Payer: Medicare Other

## 2022-03-25 NOTE — Progress Notes (Signed)
Remote ICD transmission.   

## 2022-03-27 ENCOUNTER — Other Ambulatory Visit (HOSPITAL_COMMUNITY): Payer: Self-pay | Admitting: Adult Health

## 2022-03-31 ENCOUNTER — Ambulatory Visit (INDEPENDENT_AMBULATORY_CARE_PROVIDER_SITE_OTHER): Payer: Medicare Other

## 2022-03-31 DIAGNOSIS — Z95810 Presence of automatic (implantable) cardiac defibrillator: Secondary | ICD-10-CM | POA: Diagnosis not present

## 2022-03-31 DIAGNOSIS — I5022 Chronic systolic (congestive) heart failure: Secondary | ICD-10-CM | POA: Diagnosis not present

## 2022-03-31 NOTE — Progress Notes (Signed)
EPIC Encounter for ICM Monitoring ? ?Patient Name: Wayne [name] is a 81 y.o. male ?Date: 03/31/2022 ?Primary Care Physican: [name], MD ?Primary Cardiologist: Aundra Dubin ?Electrophysiologist: Lovena Le ?11/18/2021 Weight:  227 lbs (baseline) ?02/25/2022 Office Weight: 234 lbs ?04/01/2022 Weight: 230 lbs  ?  ?      Spoke with patient and heart failure questions reviewed.  Pt reports back pain.  He was SOB last week but breathing is back close to baseline.   He has not been taking Furosemide daily as prescribed due to a busy schedule.   ?  ?Optivol thoracic impedance suggesting possible fluid accumulation starting 4/3 and returned close to baseline on 4/17. ?  ?Prescribed: ?Furosemide 20 mg take 1 tablet (20 mg total) by mouth twice a day. ?Potassium 20 mEq  take 1 tablet (20 mEq total) by mouth daily. ?Spironolactone 25 mg take 0.5 tablet (12.5 mg total) daily ?  ?Labs: ?03/10/2022 Creatinine 1.58, BUN 29, Potassium 5.6, Sodium 142, GFR 44 ?02/25/2022 Creatinine 1.41, BUN 19, Potassium 5.6, Sodium 141, GFR 50 ?A complete set of results can be found in Results Review. ?  ?Recommendations:  Advised to take Furosemide as prescribed to manage fluid and discuss with HF clinic if he can take the Furosemide dosages one time a day instead of twice a day since he has busy schedule.  ?  ?Follow-up plan: ICM clinic phone appointment on 04/07/2022 to recheck fluid levels.   91 day device clinic remote transmission 06/12/2022.   ?  ?EP/Cardiology Office Visits:  04/04/2022 with HF clinic.  Recall 11/13/2022 with Dr Lovena Le. ?  ?Copy of ICM check sent to Dr. Lovena Le.    ? ?3 month ICM trend: 03/31/2022. ? ? ? ?12-14 Month ICM trend:  ? ? ? ?Rosalene Billings, RN ?03/31/2022 ?12:51 PM ? ?

## 2022-04-01 ENCOUNTER — Telehealth (HOSPITAL_COMMUNITY): Payer: Self-pay | Admitting: *Deleted

## 2022-04-01 NOTE — Telephone Encounter (Signed)
Pt left VM for [employer] and she sent me a message regarding pts complaints. "he and his wife are thinking that the methimazole is giving him very bad diarrhea and asked that he be switched to something else. stating he is going to the bathroom like 10 times a day and starting yesterday it was black" ? ? ?Routed to [facility] for advice ?

## 2022-04-01 NOTE — Telephone Encounter (Signed)
The hyperthyroidism could be causing the diarrhea, not likely the medication.  He needs a CBC done if the stool is black.  He still does not have an endocrinology appointment, had wanted this done after his 03/10/22 appointment.  Please call [facility] directly and schedule appointment for hyperthyroidism management ideally in the next week or so since he has waited so long at this point.  ?

## 2022-04-02 NOTE — Telephone Encounter (Signed)
Pt has appt with APP Friday and will have cbc checked. Pt said his stool is not as dark but agrees to labs. Per Endocrinology they are not taking urgent referrals our appointments as they are down staff and a provider. Pt will be contacted as soon as possible.  ?

## 2022-04-04 ENCOUNTER — Other Ambulatory Visit (HOSPITAL_COMMUNITY): Payer: Self-pay | Admitting: *Deleted

## 2022-04-04 ENCOUNTER — Encounter: Payer: Self-pay | Admitting: Internal Medicine

## 2022-04-04 ENCOUNTER — Ambulatory Visit (HOSPITAL_COMMUNITY)
Admission: RE | Admit: 2022-04-04 | Discharge: 2022-04-04 | Disposition: A | Discharge status: Home / Self Care | Payer: Medicare Other | Source: Ambulatory Visit | Attending: Family Medicine | Admitting: Family Medicine

## 2022-04-04 ENCOUNTER — Encounter (HOSPITAL_COMMUNITY): Payer: Self-pay

## 2022-04-04 VITALS — BP 118/60 | HR 59 | Wt 228.2 lb

## 2022-04-04 DIAGNOSIS — N1831 Chronic kidney disease, stage 3a: Secondary | ICD-10-CM | POA: Diagnosis not present

## 2022-04-04 DIAGNOSIS — E785 Hyperlipidemia, unspecified: Secondary | ICD-10-CM | POA: Insufficient documentation

## 2022-04-04 DIAGNOSIS — I2510 Atherosclerotic heart disease of native coronary artery without angina pectoris: Secondary | ICD-10-CM | POA: Diagnosis not present

## 2022-04-04 DIAGNOSIS — I130 Hypertensive heart and chronic kidney disease with heart failure and stage 1 through stage 4 chronic kidney disease, or unspecified chronic kidney disease: Secondary | ICD-10-CM | POA: Insufficient documentation

## 2022-04-04 DIAGNOSIS — Z79899 Other long term (current) drug therapy: Secondary | ICD-10-CM | POA: Insufficient documentation

## 2022-04-04 DIAGNOSIS — E0590 Thyrotoxicosis, unspecified without thyrotoxic crisis or storm: Secondary | ICD-10-CM | POA: Diagnosis not present

## 2022-04-04 DIAGNOSIS — I469 Cardiac arrest, cause unspecified: Secondary | ICD-10-CM

## 2022-04-04 DIAGNOSIS — Z7901 Long term (current) use of anticoagulants: Secondary | ICD-10-CM | POA: Diagnosis not present

## 2022-04-04 DIAGNOSIS — I480 Paroxysmal atrial fibrillation: Secondary | ICD-10-CM | POA: Insufficient documentation

## 2022-04-04 DIAGNOSIS — N183 Chronic kidney disease, stage 3 unspecified: Secondary | ICD-10-CM | POA: Diagnosis not present

## 2022-04-04 DIAGNOSIS — I255 Ischemic cardiomyopathy: Secondary | ICD-10-CM | POA: Insufficient documentation

## 2022-04-04 DIAGNOSIS — E1122 Type 2 diabetes mellitus with diabetic chronic kidney disease: Secondary | ICD-10-CM | POA: Insufficient documentation

## 2022-04-04 DIAGNOSIS — I4891 Unspecified atrial fibrillation: Secondary | ICD-10-CM

## 2022-04-04 DIAGNOSIS — I739 Peripheral vascular disease, unspecified: Secondary | ICD-10-CM | POA: Diagnosis not present

## 2022-04-04 DIAGNOSIS — I5022 Chronic systolic (congestive) heart failure: Secondary | ICD-10-CM

## 2022-04-04 DIAGNOSIS — Z87440 Personal history of urinary (tract) infections: Secondary | ICD-10-CM | POA: Insufficient documentation

## 2022-04-04 DIAGNOSIS — I447 Left bundle-branch block, unspecified: Secondary | ICD-10-CM | POA: Diagnosis not present

## 2022-04-04 DIAGNOSIS — G4733 Obstructive sleep apnea (adult) (pediatric): Secondary | ICD-10-CM | POA: Insufficient documentation

## 2022-04-04 DIAGNOSIS — I4901 Ventricular fibrillation: Secondary | ICD-10-CM | POA: Insufficient documentation

## 2022-04-04 LAB — BASIC METABOLIC PANEL
Anion gap: 7 (ref 5–15)
BUN: 23 mg/dL (ref 8–23)
CO2: 22 mmol/L (ref 22–32)
Calcium: 9.4 mg/dL (ref 8.9–10.3)
Chloride: 110 mmol/L (ref 98–111)
Creatinine, Ser: 1.38 mg/dL — ABNORMAL HIGH (ref 0.61–1.24)
GFR, Estimated: 52 mL/min — ABNORMAL LOW (ref >60)
Glucose, Bld: 222 mg/dL — ABNORMAL HIGH (ref 70–99)
Potassium: 4.3 mmol/L (ref 3.5–5.1)
Sodium: 139 mmol/L (ref 135–145)

## 2022-04-04 LAB — CBC
HCT: 47.9 % (ref 39.0–52.0)
Hemoglobin: 15.1 g/dL (ref 13.0–17.0)
MCH: 28.4 pg (ref 26.0–34.0)
MCHC: 31.5 g/dL (ref 30.0–36.0)
MCV: 90 fL (ref 80.0–100.0)
Platelets: 144 10*3/uL — ABNORMAL LOW (ref 150–400)
RBC: 5.32 MIL/uL (ref 4.22–5.81)
RDW: 13.5 % (ref 11.5–15.5)
WBC: 5.7 10*3/uL (ref 4.0–10.5)
nRBC: 0 % (ref 0.0–0.2)

## 2022-04-04 MED ORDER — MEXILETINE HCL 150 MG PO CAPS: 150.0000 mg | ORAL_CAPSULE | Freq: Two times a day (BID) | ORAL | 3 refills | Status: DC

## 2022-04-04 MED ORDER — METHIMAZOLE 10 MG PO TABS: 10.0000 mg | ORAL_TABLET | Freq: Every day | ORAL | 3 refills | Status: DC

## 2022-04-04 MED ORDER — DAPAGLIFLOZIN PROPANEDIOL 10 MG PO TABS: 10.0000 mg | ORAL_TABLET | Freq: Every day | ORAL | Status: DC

## 2022-04-04 MED ORDER — FUROSEMIDE 20 MG PO TABS: ORAL_TABLET | ORAL | 3 refills | Status: DC

## 2022-04-04 NOTE — Addendum Note (Signed)
Encounter addended by: Stanford Scotland, RN on: 04/04/2022 2:25 PM ? Actions taken: Order Reconciliation Section accessed

## 2022-04-04 NOTE — Progress Notes (Signed)
Patient ID: Wayne [name], male   DOB: 1941/07/17, 81 y.o.   MRN: [id number] ?  ?Advanced Heart Failure Clinic Note  ? ?Patient ID: Wayne [name], male   DOB: [birth year], 81 y.o.   MRN: [id number] ?PCP: Dr. Elease Hashimoto ?Cardiology: Dr. Aundra Dubin ? ?Wayne [name] is a 81 y.o. male  with history of DM, HTN, CAD, and ischemic cardiomyopathy who presents for followup of CHF and CAD. Patient had a CHF exacerbation in 2/12 and was found to have LV systolic dysfunction with EF around 20%. LHC showed RCA, ramus, and CFX disease. RCA was subtotally occluded. Cardiac MRI showed that all walls, including the inferior wall, should be viable. Patient therefore underwent opening of his chronic totally occluded RCA as well as PCI to the ramus in 2/12. He received drug eluting stents and was on Effient.  He had an echo in 2/13 that showed EF improved to 45% with moderate LV dilation and mild LV hypertrophy.  ? ?In 5/13, he developed a large left lower leg hematoma.  He was still on Effient at that time.  ASA and Effient were stopped.  The hematoma did not resolve.  In 7/13, he was re-admitted with septic shock from MSSA from an abscess in his left gastrocnemius.  He also grew Pseudomonas from the left gastrocnemius as well.  He had a prolonged course in the hospital and later in a rehab facility.  Ultimately, he got back home again.  I had him get an echo in 1/14, and this showed EF 15% with diffuse hypokinesis and inferoposterior akinesis.  He had been off of most of his prior cardiac medications.  Took him back for [facility] in 1/14.  This showed subtotal occlusion of a small AV LCx and 95% ostial in-stent restenosis in the RCA.  He was treated in 2/14 with a Xience DES to the ostial RCA and begun on Plavix.  Unfortunately, he developed diffuse hives after starting Plavix and had to be switched to ticlopidine.  He has tolerated ticlopidine.   Echo done in 12/14 showed some improvement in LV function but EF was still low at 30-35%.  He did not want  ICD.   ? ?Presented to ED 11/14/15 with worsening SOB after a few steps and CXR with CHF and bilateral effusions in the setting of marked medical non-compliance. States he stopped taking his medicines in 01/2015 (except for ASA 81 and a multivitamin).  He was feeling good and decided that he did not need them anymore.  Also c/o URI symptoms. Diuresed over 2 L with IV diuretics in the ER and started on lasix 20 mg daily.  ? ?Echo (12/16) showed EF 20-25% with diffuse hypokinesis (down from 35% in 12/15).  Echo 10/17 showed EF 15% with mildly decreased RV systolic function.   ? ?He had left and right heart catheterization in 12/17. This showed elevated right and left heart filling pressures and low cardiac output. The RCA was totally occluded with collaterals, there was 95% ostial to mid LAD stenosis.  Patient had PCI with DES to proximal-mid LAD.   ? ?He was noted to be in atrial fibrillation with RVR in 2/18 at cardiac rehab. He felt fatigued.  He was admitted and started on amiodarone for rate control. ASA was stopped, ticagrelor was decreased to 60 mg bid, and Xarelto 15 mg daily was started.  He converted spontaneously back to NSR.   ? ?He suffered a ventricular fibrillation arrest in 3/18.  Luckily, he was wearing  a Lifevest and was shocked.  He was admitted and started on amiodarone.  Medtronic ICD was placed.  Echo 3/18 with EF 20-25%.  ? ?Admitted again 6/18 due to VF arrest. ICD lead had been undersensing leading to prolongation of time to discharge.  He underwent RV lead extraction with new lead implant.  ? ?He was admitted again with VT storm in 7/18. He had coronary angiography this admission.  Had cutting balloon angioplasty of severe in-stent restenosis in the proximal LAD.  He also had VT ablation in 7/18.  ? ?He had TURBT 11/18, urothelial cancer diagnosed.  TURBT again in 6/21.  ? ?Echo in 8/21 showed EF 25%, diffuse hypokinesis, moderate LV dilation, normal RV, IVC normal.  ? ?He had dilatation of  urethral stricture in 8/22.  He is off [place] due to UTI and recurrent urethral stricture.  ? ?Follow up 3/23 volume was up and Lasix started. Amiodarone stopped and mexiletine started with elevated TSH and free T4. Also started on methimazole and referred urgently to endocrinology. K 5.6, spiro continued and Lokelma 5 mg started. ? ?Today he returns for HF follow up with his wife. Overall feeling better. He has missed a couple days of Lasix but has no further swelling. He does not like to take Lasix if he and his wife have to go out.  Minimal dyspnea with activity. He had diarrhea at the start of new medication but he is unable to recall which medication this was. GI upset stopped when he stopped his medication. Denies abnormal bleeding, palpitations, CP, dizziness, edema, or PND/Orthopnea. Appetite ok. No fever or chills. Weight at home 230 pounds.   ? ?ECG (personally reviewed): SB 58 bpm LBBB QRS 490 msec ? ?Device interrogation (personally reviewed): OptiVol better but trending back towards threshold, thoracic impedence at baseline, 1-2 hrs day/ activity, no AF, no VT ? ?Labs (12/16): K 4.9, creatinine 1.23 ?Labs (1/17): K 4.7, creatinine 1.15, BNP 1433 ?Labs (2/17): K 4.7, creatinine 1.14, BNP 870 ?Labs (3/17): K 4.5, creatinine 1.10, BNP 1257 ?Labs (5/17): K 4.2, creatinine 1.2, BNP 818 ?Labs (7/17): LDL 149, HDL 40, TGs 210 ?Labs (9/17): K 4.9, creatinine 1.36 ?Labs (10/17): K 3.7, creatinine 1.2, LDL 87, HDL 32, LFTs normal ?Labs (12/17): K 4.5, creatinine 1.34, digoxin 0.3 ?Labs (2/18): K 4.4, creatinine 1.48, hgb 11.8 ?Labs (3/18): K 5 => 4.6, creatinine 1.54 => 1.46, digoxin 1.3 => 0.3 ?Labs (5/18): TSH normal ?Labs (7/18): K 3.7, creatinine 1.33, hgb 11.4, digoxin 0.6, LFTs normal ?Labs (8/18): K 5.1, creatinine 1.47, hgb 13.3 ?Labs (10/18): LDL 50, HDL 40, TGs 247 ?Labs (11/18): K 5.9, creatinine 1.42, hgb 15.2 ?Labs (12/18): K 5, creatinine 1.27, digoxin 0.6, LFTs normal, TSH normal ?Labs (3/19): LDL  86, HDL 45 ?Labs (6/19): K 4.1, creatinine 1.34, LFTs normal ?Labs (6/21): K 5, creatinine 1.3 ?Labs (8/21): K 5.1, creatinine 1.47, LDL 111, HDL 37 ?Labs (8/22): K 4.6, creatinine 1.31 ?Labs (10/22): K 4.8, creatinine 1.38, TSH normal, LFTs normal ?Labs (3/23): K 5.6, creatinine 1.58, TSH 0.033, free T4 1.91, LFTs normal ? ?Past Medical History:  ?1. HYPERTENSION  ?2. HYPERLIPIDEMIA: Myalgias with atorvastatin and Crestor ?3. ECZEMA  ?4. RHINITIS  ?5. HERPES ZOSTER OPHTHALMICUS  ?6. ADENOCARCINOMA, PROSTATE: Status post prostatectomy in 2009. Has had some incontinence since then.  ?7. Diabetes mellitus type II  ?8. Arthritis  ?9. Obesity  ?10. GERD: rare  ?11. CAD: Presented with exertional dyspnea, never had chest pain. LHC (2/12) with subtotalled proximal RCA and  left to right collaterals, 90% proximal moderate-sized ramus, 95% proximal relatively small CFX, 40-50% proximal LAD. Cardiac MRI (2/12) showed EF 21%, some mild scar in basal segments but all wall segments would be expected to be viable. DES x 5 (overlapping) to RCA, DES x 1 to RI 01/30/11 .  Bled into leg with Effient use (long, complicated course).  LHC (1/14): AV LCx small with subtotal occlusion, 95% ostial instent restenosis in RCA. PCI in 2/14 to ostial RCA with 3.5 x 15 Xience DES.  Plavix allergy (hives) so put on ticlopidine.  ?- LHC (12/17):  the RCA was totally occluded with collaterals, there was 95% ostial to mid LAD stenosis, patient had PCI with DES to proximal-mid LAD. ?- LHC (7/18): Totally occluded RCA with left to right collaterals, subtotal occlusion of small AV LCx, 90% ISR proximal LAD => cutting balloon angioplasty.  ?12. Ischemic CMP: Echo (2/12) with moderately dilated LV, EF about 20% with diffuse hypokinesis and inferior akinesis, pseudonormal diastolic function, mild MR, severe LAE, mildly decreased RV systolic function. RHC (2/12) with mean RA 12, PA 40/25, mean PCWP 26, CI 2.1.  Echo (5/12) with EF 40% (appeared worse to my  eye) with posterior HK, basal inferior AK, inferoseptal AK, basal anteroseptal AK, mild MR.  Cardiac MRI was repeated and showed EF 32% (improved from 21%) and mild LV dilation (was severely dilated b

## 2022-04-04 NOTE — Patient Instructions (Addendum)
EKG done today. ? ?Labs done today. We will contact you only if your labs are abnormal. ? ?INCREASE Lasix to '20mg'$  (1 tablet) by mouth daily alternating with '40mg'$  (2 tablets) by mouth every other day.  ? ?No other medication changes were made. Please continue all current medications as prescribed. ? ?Your physician recommends that you schedule a follow-up appointment in: 10-14 days for a lab only appointment and in 3 months with Dr. Aundra Dubin with an echo prior to your exam.  ? ?If you have any questions or concerns before your next appointment please send [place] a message through [place] or call our office at [phone].   ? ?TO LEAVE A MESSAGE FOR THE NURSE SELECT OPTION 2, PLEASE LEAVE A MESSAGE INCLUDING: ?YOUR NAME ?DATE OF BIRTH ?CALL BACK NUMBER ?REASON FOR CALL**this is important as we prioritize the call backs ? ?YOU WILL RECEIVE A CALL BACK THE SAME DAY AS LONG AS YOU CALL BEFORE 4:00 PM ? ? ?Do the following things EVERYDAY: ?Weigh yourself in the morning before breakfast. Write it down and keep it in a log. ?Take your medicines as prescribed ?Eat low salt foods--Limit salt (sodium) to 2000 mg per day.  ?Stay as active as you can everyday ?Limit all fluids for the day to less than 2 liters ? ? ?At the [facility], you and your health needs are our priority. As part of our continuing mission to provide you with exceptional heart care, we have created designated Provider Care Teams. These Care Teams include your primary Cardiologist (physician) and Advanced Practice Providers (APPs- Physician Assistants and Nurse Practitioners) who all work together to provide you with the care you need, when you need it.  ? ?You may see any of the following providers on your designated Care Team at your next follow up: ?Dr Glori Bickers ?Dr Loralie Champagne ?Darrick Grinder, NP ?Lyda Jester, PA ?Audry Riles, PharmD ? ? ?Please be sure to bring in all your medications bottles to every appointment.  ? ?

## 2022-04-07 ENCOUNTER — Ambulatory Visit (INDEPENDENT_AMBULATORY_CARE_PROVIDER_SITE_OTHER): Payer: Medicare Other

## 2022-04-07 DIAGNOSIS — I5022 Chronic systolic (congestive) heart failure: Secondary | ICD-10-CM

## 2022-04-07 DIAGNOSIS — Z95810 Presence of automatic (implantable) cardiac defibrillator: Secondary | ICD-10-CM

## 2022-04-07 NOTE — Progress Notes (Signed)
EPIC Encounter for ICM Monitoring ? ?Patient Name: Wayne [name] is a 81 y.o. male ?Date: 04/07/2022 ?Primary Care Physican: [name], MD ?Primary Cardiologist: Aundra Dubin ?Electrophysiologist: Lovena Le ?02/25/2022 Office Weight: 234 lbs ?04/01/2022 Weight: 230 lbs  ?  ?      Attempted call to patient and unable to reach.  Left detailed message per DPR regarding transmission. Transmission reviewed.  ?  ?Optivol thoracic impedance suggesting fluid levels returned to normal after recommendation to take Furosemide as prescribed.  ?  ?Prescribed: ?Furosemide 20 mg Take '20mg'$  (1 tablet)by mouth daily alternating with '40mg'$  (2 tablets) every other day. ?Spironolactone 25 mg take 0.5 tablet (12.5 mg total) daily ?  ?Labs: ?04/04/2022 Creatinine 1.38, BUN 23, Potassium 4.3, Sodium 139, GFR 52 ?03/10/2022 Creatinine 1.58, BUN 29, Potassium 5.6, Sodium 142, GFR 44 ?02/25/2022 Creatinine 1.41, BUN 19, Potassium 5.6, Sodium 141, GFR 50 ?A complete set of results can be found in Results Review. ?  ?Recommendations:  Left voice mail with ICM number and encouraged to call if experiencing any fluid symptoms.  ?  ?Follow-up plan: ICM clinic phone appointment on 05/05/2022.   91 day device clinic remote transmission 06/12/2022.   ?  ?EP/Cardiology Office Visits:  07/10/2022 with Dr Aundra Dubin.  Recall 11/13/2022 with Dr Lovena Le. ?  ?Copy of ICM check sent to Dr. Lovena Le.    ? ?3 month ICM trend: 04/07/2022. ? ? ? ?12-14 Month ICM trend:  ? ? ? ?Rosalene Billings, RN ?04/07/2022 ?11:00 AM ? ?

## 2022-04-14 ENCOUNTER — Other Ambulatory Visit (HOSPITAL_COMMUNITY): Payer: Medicare Other

## 2022-04-17 ENCOUNTER — Other Ambulatory Visit (HOSPITAL_COMMUNITY): Payer: Self-pay | Admitting: Cardiology

## 2022-04-17 ENCOUNTER — Other Ambulatory Visit (HOSPITAL_COMMUNITY): Payer: Self-pay | Admitting: Adult Health

## 2022-04-22 ENCOUNTER — Telehealth (HOSPITAL_COMMUNITY): Payer: Self-pay | Admitting: Cardiology

## 2022-04-22 ENCOUNTER — Ambulatory Visit (HOSPITAL_COMMUNITY)
Admission: RE | Admit: 2022-04-22 | Discharge: 2022-04-22 | Disposition: A | Discharge status: Home / Self Care | Payer: Medicare Other | Source: Ambulatory Visit | Attending: Cardiology | Admitting: Cardiology

## 2022-04-22 DIAGNOSIS — I5022 Chronic systolic (congestive) heart failure: Secondary | ICD-10-CM | POA: Diagnosis not present

## 2022-04-22 DIAGNOSIS — E785 Hyperlipidemia, unspecified: Secondary | ICD-10-CM | POA: Diagnosis not present

## 2022-04-22 LAB — LIPID PANEL
Cholesterol: 150 mg/dL (ref 0–200)
HDL: 42 mg/dL (ref >40)
LDL Cholesterol: 89 mg/dL (ref 0–99)
Total CHOL/HDL Ratio: 3.6 RATIO
Triglycerides: 97 mg/dL (ref <150)
VLDL: 19 mg/dL (ref 0–40)

## 2022-04-22 LAB — BASIC METABOLIC PANEL
Anion gap: 7 (ref 5–15)
BUN: 21 mg/dL (ref 8–23)
CO2: 26 mmol/L (ref 22–32)
Calcium: 9.6 mg/dL (ref 8.9–10.3)
Chloride: 110 mmol/L (ref 98–111)
Creatinine, Ser: 1.65 mg/dL — ABNORMAL HIGH (ref 0.61–1.24)
GFR, Estimated: 42 mL/min — ABNORMAL LOW (ref >60)
Glucose, Bld: 163 mg/dL — ABNORMAL HIGH (ref 70–99)
Potassium: 5.5 mmol/L — ABNORMAL HIGH (ref 3.5–5.1)
Sodium: 143 mmol/L (ref 135–145)

## 2022-04-22 LAB — HEPATIC FUNCTION PANEL
ALT: 24 U/L (ref 0–44)
AST: 26 U/L (ref 15–41)
Albumin: 3.5 g/dL (ref 3.5–5.0)
Alkaline Phosphatase: 69 U/L (ref 38–126)
Bilirubin, Direct: 0.2 mg/dL (ref 0.0–0.2)
Indirect Bilirubin: 0.6 mg/dL (ref 0.3–0.9)
Total Bilirubin: 0.8 mg/dL (ref 0.3–1.2)
Total Protein: 6.2 g/dL — ABNORMAL LOW (ref 6.5–8.1)

## 2022-04-22 MED ORDER — LOKELMA 5 G PO PACK: 5.0000 g | PACK | Freq: Every day | ORAL | 11 refills | Status: DC

## 2022-04-22 MED ORDER — SPIRONOLACTONE 25 MG PO TABS: 12.5000 mg | ORAL_TABLET | Freq: Every day | ORAL | 3 refills | Status: DC

## 2022-04-22 NOTE — Telephone Encounter (Signed)
?   Larey Dresser, MD  ?04/22/2022  4:03 PM EDT   ?  ?1.  Let's keep him on spironolactone if possible.  If he is taking Lokelma, increase to 10 g daily.  If he is not taking Lokelma, start it at 5 g daily. BMET 1 week.  ?2.  He still needs ASAP appointment with endocrinology for hyperthyroidism.  Please make sure this gets scheduled.   ? ? ? ?Patient called.  Patient aware. Patient reports he will have labs repeated at PCP-states he DOES NOT need order he can walk in for labs  ? ?

## 2022-04-22 NOTE — Telephone Encounter (Signed)
-----   Message from Rafael Bihari, [place] sent at 04/22/2022  1:48 PM EDT ----- ?K is high. Please make sure he is not taking any KCL supplements. ? ?Decrease spiro to 12.5 mg daily. ? ?Repeat BMET in 1 week ?

## 2022-04-29 DIAGNOSIS — H00015 Hordeolum externum left lower eyelid: Secondary | ICD-10-CM | POA: Diagnosis not present

## 2022-05-08 ENCOUNTER — Other Ambulatory Visit (HOSPITAL_COMMUNITY): Payer: Self-pay | Admitting: Adult Health

## 2022-05-08 NOTE — Progress Notes (Signed)
No ICM remote transmission received for 05/05/2022 and next ICM transmission scheduled for 05/13/2022.

## 2022-05-15 ENCOUNTER — Telehealth: Payer: Self-pay

## 2022-05-15 NOTE — Telephone Encounter (Signed)
I spoke with the patient about missed ICM transmission and his monitor is not working. Its not turning on. I ordered the patient a new monitor and he should receive it in 7-10 business days.

## 2022-05-16 NOTE — Progress Notes (Signed)
No ICM remote transmission received for 05/13/2022 and next ICM transmission scheduled for 06/02/2022.

## 2022-05-21 DIAGNOSIS — N35012 Post-traumatic membranous urethral stricture: Secondary | ICD-10-CM | POA: Diagnosis not present

## 2022-05-21 DIAGNOSIS — Z8551 Personal history of malignant neoplasm of bladder: Secondary | ICD-10-CM | POA: Diagnosis not present

## 2022-05-22 DIAGNOSIS — R3914 Feeling of incomplete bladder emptying: Secondary | ICD-10-CM | POA: Diagnosis not present

## 2022-05-26 DIAGNOSIS — R3912 Poor urinary stream: Secondary | ICD-10-CM | POA: Diagnosis not present

## 2022-05-26 DIAGNOSIS — R3914 Feeling of incomplete bladder emptying: Secondary | ICD-10-CM | POA: Diagnosis not present

## 2022-05-26 DIAGNOSIS — N320 Bladder-neck obstruction: Secondary | ICD-10-CM | POA: Diagnosis not present

## 2022-05-29 ENCOUNTER — Other Ambulatory Visit (HOSPITAL_COMMUNITY): Payer: Self-pay | Admitting: Pharmacist

## 2022-05-29 MED ORDER — LOSARTAN POTASSIUM 25 MG PO TABS: 25.0000 mg | ORAL_TABLET | Freq: Two times a day (BID) | ORAL | 6 refills | Status: DC

## 2022-06-02 NOTE — Progress Notes (Signed)
ICM remote transmission rescheduled for 06/10/2022.

## 2022-06-04 ENCOUNTER — Other Ambulatory Visit: Payer: Self-pay | Admitting: Urology

## 2022-06-04 DIAGNOSIS — Z8551 Personal history of malignant neoplasm of bladder: Secondary | ICD-10-CM | POA: Diagnosis not present

## 2022-06-04 DIAGNOSIS — R310 Gross hematuria: Secondary | ICD-10-CM | POA: Diagnosis not present

## 2022-06-04 DIAGNOSIS — N320 Bladder-neck obstruction: Secondary | ICD-10-CM | POA: Diagnosis not present

## 2022-06-06 ENCOUNTER — Other Ambulatory Visit (HOSPITAL_COMMUNITY): Payer: Self-pay | Admitting: Adult Health

## 2022-06-10 ENCOUNTER — Ambulatory Visit (INDEPENDENT_AMBULATORY_CARE_PROVIDER_SITE_OTHER): Payer: Medicare Other

## 2022-06-10 DIAGNOSIS — I5022 Chronic systolic (congestive) heart failure: Secondary | ICD-10-CM

## 2022-06-10 DIAGNOSIS — Z95810 Presence of automatic (implantable) cardiac defibrillator: Secondary | ICD-10-CM

## 2022-06-12 ENCOUNTER — Ambulatory Visit (INDEPENDENT_AMBULATORY_CARE_PROVIDER_SITE_OTHER): Payer: Medicare Other

## 2022-06-12 DIAGNOSIS — I5022 Chronic systolic (congestive) heart failure: Secondary | ICD-10-CM

## 2022-06-13 DIAGNOSIS — N3289 Other specified disorders of bladder: Secondary | ICD-10-CM | POA: Diagnosis not present

## 2022-06-13 DIAGNOSIS — N281 Cyst of kidney, acquired: Secondary | ICD-10-CM | POA: Diagnosis not present

## 2022-06-13 DIAGNOSIS — K8020 Calculus of gallbladder without cholecystitis without obstruction: Secondary | ICD-10-CM | POA: Diagnosis not present

## 2022-06-13 DIAGNOSIS — R310 Gross hematuria: Secondary | ICD-10-CM | POA: Diagnosis not present

## 2022-06-13 NOTE — Progress Notes (Signed)
EPIC Encounter for ICM Monitoring  Patient Name: Wayne [name] is a 81 y.o. male Date: 06/13/2022 Primary Care Physican: [name], MD Primary Cardiologist: Aundra Dubin Electrophysiologist: Lovena Le 04/01/2022 Weight: 230 lbs          Spoke with patient and heart failure questions reviewed.  Pt asymptomatic for fluid accumulation.  Reports feeling well at this time and voices no complaints.    Optivol thoracic impedance suggesting normal fluid levels.    Prescribed: Furosemide 20 mg Take '20mg'$  (1 tablet)by mouth daily alternating with '40mg'$  (2 tablets) every other day. Spironolactone 25 mg take 0.5 tablet (12.5 mg total) daily   Labs: 04/22/2022 Creatinine 1.65, BUN 21, Potassium 5.5, Sodium 143, GFR 42 04/04/2022 Creatinine 1.38, BUN 23, Potassium 4.3, Sodium 139, GFR 52 03/10/2022 Creatinine 1.58, BUN 29, Potassium 5.6, Sodium 142, GFR 44 02/25/2022 Creatinine 1.41, BUN 19, Potassium 5.6, Sodium 141, GFR 50 A complete set of results can be found in Results Review.   Recommendations:  No changes and encouraged to call if experiencing any fluid symptoms.   Follow-up plan: ICM clinic phone appointment on 07/14/2022.   91 day device clinic remote transmission 09/11/2022.     EP/Cardiology Office Visits:  07/10/2022 with Dr Aundra Dubin.  Recall 11/13/2022 with Dr Lovena Le.   Copy of ICM check sent to Dr. Lovena Le.     3 month ICM trend: 06/10/2022.    12-14 Month ICM trend:     Rosalene Billings, RN 06/13/2022 9:45 AM

## 2022-06-15 LAB — CUP PACEART REMOTE DEVICE CHECK
Battery Remaining Longevity: 46 mo
Battery Voltage: 2.97 V
Brady Statistic RV Percent Paced: 0.01 %
Date Time Interrogation Session: 20230630163105
HighPow Impedance: 77 Ohm
Implantable Lead Implant Date: 20180612
Implantable Lead Location: 753860
Implantable Lead Model: 6935
Implantable Pulse Generator Implant Date: 20180103
Lead Channel Impedance Value: 380 Ohm
Lead Channel Impedance Value: 456 Ohm
Lead Channel Pacing Threshold Amplitude: 1 V
Lead Channel Pacing Threshold Pulse Width: 0.4 ms
Lead Channel Sensing Intrinsic Amplitude: 15.125 mV
Lead Channel Sensing Intrinsic Amplitude: 15.125 mV
Lead Channel Setting Pacing Amplitude: 2.5 V
Lead Channel Setting Pacing Pulse Width: 0.4 ms
Lead Channel Setting Sensing Sensitivity: 0.3 mV

## 2022-06-19 ENCOUNTER — Other Ambulatory Visit (HOSPITAL_COMMUNITY): Payer: Self-pay | Admitting: Adult Health

## 2022-06-19 NOTE — Progress Notes (Signed)
Left voicemail message with Wayne [name] at dr [email or web address], patient does not meet [facility] guidelines due to EF is 25 % and must be done at [facility] or>

## 2022-06-20 ENCOUNTER — Other Ambulatory Visit (HOSPITAL_COMMUNITY): Payer: Self-pay | Admitting: Adult Health

## 2022-06-20 ENCOUNTER — Telehealth (HOSPITAL_COMMUNITY): Payer: Self-pay | Admitting: *Deleted

## 2022-06-20 NOTE — Telephone Encounter (Signed)
Received fax from [facility], pt needs clearance for cystoscopy with balloon dilation of prostatic urethra under general anesthesia on 7/18, and approval to hold Eliquis 3 days prior  Per Dr Aundra Dubin: "ok for procedure, can hold Eliquis for 3 days"  Note faxed back to them at [phone]

## 2022-06-20 NOTE — Patient Instructions (Addendum)
DUE TO COVID-19 ONLY TWO VISITORS  (aged 79 and older)  ARE ALLOWED TO COME WITH YOU AND STAY IN THE WAITING ROOM ONLY DURING PRE OP AND PROCEDURE.   **NO VISITORS ARE ALLOWED IN THE SHORT STAY AREA OR RECOVERY ROOM!!**   Your procedure is scheduled on: Tuesday, July 01, 2022   Report to [facility] Main Entrance    Report to admitting at      1030 AM   Call this number if you have problems the morning of surgery [phone]   Do not eat food :After Midnight.   After Midnight you may have the following liquids until _0945_____ AM DAY OF SURGERY  then nothing by mouth  Water Black Coffee (sugar ok, NO MILK/CREAM OR CREAMERS)  Tea (sugar ok, NO MILK/CREAM OR CREAMERS) regular and decaf                             Plain Jell-O (NO RED)                                           Fruit ices (not with fruit pulp, NO RED)                                     Popsicles (NO RED)                                                                  Juice: apple, WHITE grape, WHITE cranberry Sports drinks like Gatorade (NO RED) Clear broth(vegetable,chicken,beef)                          If you have questions, please contact your surgeon's office.   FOLLOW ANY ADDITIONAL PRE OP INSTRUCTIONS YOU RECEIVED FROM YOUR SURGEON'S OFFICE!!!     Oral Hygiene is also important to reduce your risk of infection.                                    Remember - BRUSH YOUR TEETH THE MORNING OF SURGERY WITH YOUR REGULAR TOOTHPASTE   Do NOT smoke after Midnight   Take these medicines the morning of surgery with A SIP OF WATER: Carvedilol, Rosuvastatin              HOLD FARXIGA DAY BEFORE  SURGERY  DO NOT TAKE ANY ORAL DIABETIC MEDICATIONS DAY OF YOUR SURGERY  Bring CPAP mask and tubing day of surgery.                              You may not have any metal on your body including hair pins, jewelry, and body piercing             Do not lotions, powders, perfumes/cologne, or deodorant                Men may shave face and  neck.   Do not bring valuables to the hospital. [place].   Contacts, dentures or bridgework may not be worn into surgery.   Bring small overnight bag day of surgery.   DO NOT [place]. PHARMACY WILL DISPENSE MEDICATIONS LISTED ON YOUR MEDICATION LIST TO YOU DURING YOUR ADMISSION [place]!    Patients discharged on the day of surgery will not be allowed to drive home.  Someone NEEDS to stay with you for the first 24 hours after anesthesia.               Please read over the following fact sheets you were given: IF YOU HAVE QUESTIONS ABOUT YOUR PRE-OP INSTRUCTIONS PLEASE CALL [phone]      [facility] - Preparing for Surgery Before surgery, you can play an important role.  Because skin is not sterile, your skin needs to be as free of germs as possible.  You can reduce the number of germs on your skin by washing with CHG (chlorahexidine gluconate) soap before surgery.  CHG is an antiseptic cleaner which kills germs and bonds with the skin to continue killing germs even after washing. Please DO NOT use if you have an allergy to CHG or antibacterial soaps.  If your skin becomes reddened/irritated stop using the CHG and inform your nurse when you arrive at Short Stay. Do not shave (including legs and underarms) for at least 48 hours prior to the first CHG shower.  You may shave your face/neck. Please follow these instructions carefully:  1.  Shower with CHG Soap the night before surgery and the  morning of Surgery.  2.  If you choose to wash your hair, wash your hair first as usual with your  normal  shampoo.  3.  After you shampoo, rinse your hair and body thoroughly to remove the  shampoo.                           4.  Use CHG as you would any other liquid soap.  You can apply chg directly  to the skin and wash                       Gently with a scrungie or clean  washcloth.  5.  Apply the CHG Soap to your body ONLY FROM THE NECK DOWN.   Do not use on face/ open                           Wound or open sores. Avoid contact with eyes, ears mouth and genitals (private parts).                       Wash face,  Genitals (private parts) with your normal soap.             6.  Wash thoroughly, paying special attention to the area where your surgery  will be performed.  7.  Thoroughly rinse your body with warm water from the neck down.  8.  DO NOT shower/wash with your normal soap after using and rinsing off  the CHG Soap.                9.  Pat yourself dry with a  clean towel.            10.  Wear clean pajamas.            11.  Place clean sheets on your bed the night of your first shower and do not  sleep with pets. Day of Surgery : Do not apply any lotions/deodorants the morning of surgery.  Please wear clean clothes to the hospital/surgery center.  FAILURE TO FOLLOW THESE INSTRUCTIONS MAY RESULT IN THE CANCELLATION OF YOUR SURGERY PATIENT SIGNATURE_________________________________  NURSE SIGNATURE__________________________________  ________________________________________________________________________

## 2022-06-20 NOTE — Progress Notes (Signed)
PCP -  Cardiologist - Dalton McLean,MD LOV 02-25-22 epic  PPM/ICD -  Device Orders -  Rep Notified -   Chest x-ray -  EKG - 04-04-22 epic Stress Test -  ECHO - 2021 Cardiac Cath -   Sleep Study -  CPAP -   Fasting Blood Sugar -  Checks Blood Sugar _____ times a day  Blood Thinner Instructions: Aspirin Instructions:  ERAS Protcol - PRE-SURGERY Ensure or G2-    COVID vaccine -  Activity-- Anesthesia review: CAD stent, MI CHF, ICD Mild to moderate aortic stenosis  Patient denies shortness of breath, fever, cough and chest pain at PAT appointment   All instructions explained to the patient, with a verbal understanding of the material. Patient agrees to go over the instructions while at home for a better understanding. Patient also instructed to self quarantine after being tested for COVID-19. The opportunity to ask questions was provided.

## 2022-06-23 ENCOUNTER — Encounter: Payer: Self-pay | Admitting: Internal Medicine

## 2022-06-23 NOTE — Progress Notes (Signed)
[facility] DEVICE PROGRAMMING  Patient Information: Name:  Wayne [name]  DOB:  1941-11-06  MRN:  [id number]    Planned Procedure:Cystoscopy with balloon dilation of prostatic urethra  Surgeon: Festus Aloe  Date of Procedure: 07-01-22  Cautery will be used.  Position during surgery: N/A   Please send documentation back to:  [facility] (Fax # [phone])    Device Information:  Clinic EP Physician:  [name], MD   Device Type:  Pacemaker and Defibrillator Manufacturer and Phone #:  Medtronic: [id number] Pacemaker Dependent?:  No. Date of Last Device Check:  06/13/22 remote, 11/18/21 in clinic Normal Device Function?:  Yes.    Electrophysiologist's Recommendations:  Have magnet available. Provide continuous ECG monitoring when magnet is used or reprogramming is to be performed.  Procedure should not interfere with device function.  No device programming or magnet placement needed.  Per Device Clinic Standing Orders, Willadean Carol, [place]  9:26 AM 06/23/2022

## 2022-06-24 ENCOUNTER — Encounter (HOSPITAL_COMMUNITY): Payer: Self-pay

## 2022-06-24 ENCOUNTER — Other Ambulatory Visit: Payer: Self-pay

## 2022-06-24 ENCOUNTER — Encounter (HOSPITAL_COMMUNITY)
Admission: RE | Admit: 2022-06-24 | Discharge: 2022-06-24 | Disposition: A | Discharge status: Home / Self Care | Payer: Medicare Other | Source: Ambulatory Visit | Attending: Urology | Admitting: Urology

## 2022-06-24 DIAGNOSIS — Z95810 Presence of automatic (implantable) cardiac defibrillator: Secondary | ICD-10-CM | POA: Diagnosis not present

## 2022-06-24 DIAGNOSIS — E119 Type 2 diabetes mellitus without complications: Secondary | ICD-10-CM

## 2022-06-24 DIAGNOSIS — Z87891 Personal history of nicotine dependence: Secondary | ICD-10-CM | POA: Insufficient documentation

## 2022-06-24 DIAGNOSIS — Z8551 Personal history of malignant neoplasm of bladder: Secondary | ICD-10-CM | POA: Insufficient documentation

## 2022-06-24 DIAGNOSIS — E1122 Type 2 diabetes mellitus with diabetic chronic kidney disease: Secondary | ICD-10-CM | POA: Insufficient documentation

## 2022-06-24 DIAGNOSIS — I447 Left bundle-branch block, unspecified: Secondary | ICD-10-CM | POA: Diagnosis not present

## 2022-06-24 DIAGNOSIS — Z7901 Long term (current) use of anticoagulants: Secondary | ICD-10-CM | POA: Insufficient documentation

## 2022-06-24 DIAGNOSIS — I2510 Atherosclerotic heart disease of native coronary artery without angina pectoris: Secondary | ICD-10-CM | POA: Diagnosis not present

## 2022-06-24 DIAGNOSIS — I129 Hypertensive chronic kidney disease with stage 1 through stage 4 chronic kidney disease, or unspecified chronic kidney disease: Secondary | ICD-10-CM | POA: Diagnosis not present

## 2022-06-24 DIAGNOSIS — I480 Paroxysmal atrial fibrillation: Secondary | ICD-10-CM | POA: Diagnosis not present

## 2022-06-24 DIAGNOSIS — Z01812 Encounter for preprocedural laboratory examination: Secondary | ICD-10-CM | POA: Insufficient documentation

## 2022-06-24 DIAGNOSIS — N320 Bladder-neck obstruction: Secondary | ICD-10-CM | POA: Insufficient documentation

## 2022-06-24 DIAGNOSIS — N183 Chronic kidney disease, stage 3 unspecified: Secondary | ICD-10-CM | POA: Diagnosis not present

## 2022-06-24 DIAGNOSIS — I255 Ischemic cardiomyopathy: Secondary | ICD-10-CM | POA: Insufficient documentation

## 2022-06-24 DIAGNOSIS — G4733 Obstructive sleep apnea (adult) (pediatric): Secondary | ICD-10-CM | POA: Insufficient documentation

## 2022-06-24 DIAGNOSIS — Z8546 Personal history of malignant neoplasm of prostate: Secondary | ICD-10-CM | POA: Insufficient documentation

## 2022-06-24 HISTORY — DX: Personal history of diseases of the blood and blood-forming organs and certain disorders involving the immune mechanism: Z86.2

## 2022-06-24 HISTORY — DX: Polyneuropathy, unspecified: G62.9

## 2022-06-24 LAB — CBC
HCT: 47.7 % (ref 39.0–52.0)
Hemoglobin: 15.1 g/dL (ref 13.0–17.0)
MCH: 28.4 pg (ref 26.0–34.0)
MCHC: 31.7 g/dL (ref 30.0–36.0)
MCV: 89.8 fL (ref 80.0–100.0)
Platelets: 157 10*3/uL (ref 150–400)
RBC: 5.31 MIL/uL (ref 4.22–5.81)
RDW: 13.8 % (ref 11.5–15.5)
WBC: 7.4 10*3/uL (ref 4.0–10.5)
nRBC: 0 % (ref 0.0–0.2)

## 2022-06-24 LAB — BASIC METABOLIC PANEL
Anion gap: 5 (ref 5–15)
BUN: 25 mg/dL — ABNORMAL HIGH (ref 8–23)
CO2: 25 mmol/L (ref 22–32)
Calcium: 9.4 mg/dL (ref 8.9–10.3)
Chloride: 107 mmol/L (ref 98–111)
Creatinine, Ser: 1.4 mg/dL — ABNORMAL HIGH (ref 0.61–1.24)
GFR, Estimated: 51 mL/min — ABNORMAL LOW (ref >60)
Glucose, Bld: 190 mg/dL — ABNORMAL HIGH (ref 70–99)
Potassium: 5.2 mmol/L — ABNORMAL HIGH (ref 3.5–5.1)
Sodium: 137 mmol/L (ref 135–145)

## 2022-06-24 LAB — HEMOGLOBIN A1C
Hgb A1c MFr Bld: 9.9 % — ABNORMAL HIGH (ref 4.8–5.6)
Mean Plasma Glucose: 237.43 mg/dL

## 2022-06-24 LAB — GLUCOSE, CAPILLARY: Glucose-Capillary: 229 mg/dL — ABNORMAL HIGH (ref 70–99)

## 2022-06-24 NOTE — Progress Notes (Signed)
PCP - [name], MD hasn't seen in a few years Cardiologist - Dalton McLean,MD LOV 02-25-22 epic   PPM/ICD - Yes Device Orders - Yes in chart Rep Notified - N/A   Chest x-ray - N/A EKG - 04-04-22 epic Stress Test - greater than 2 years ECHO - 2021 Cardiac Cath - greater than 2 years   Sleep Study - Yes CPAP - No    Fasting Blood Sugar - 200's Checks Blood Sugar __1-2___ times a month   Blood Thinner Instructions: Eliquis will stop 3 days prior to surgery Aspirin Instructions:   ERAS Protcol - PRE-SURGERY Ensure or G2-      COVID vaccine -   Activity-- Anesthesia review: CAD stent, MI CHF, ICD Mild to moderate aortic stenosis, Hgb A1c 9.9 on 06/24/22   Patient denies shortness of breath, fever, cough and chest pain at PAT appointment     All instructions explained to the patient, with a verbal understanding of the material. Patient agrees to go over the instructions while at home for a better understanding. Patient also instructed to self quarantine after being tested for COVID-19. The opportunity to ask questions was provided.

## 2022-06-25 NOTE — Progress Notes (Addendum)
Anesthesia Chart Review   Case: [id number] Date/Time: 07/01/22 1230   Procedure: CYSTOSCOPY WITH BALLOON DILATATION OF PROSTATIC URETHRA   Anesthesia type: General   Pre-op diagnosis: BENIGN PROSTATIC HYPERPLASIA, BLADDER OUTLET OBSTRUCTION   Location: [facility] ROOM 08 / [facility]   Surgeons: Festus Aloe, MD       DISCUSSION:81 y.o. former smoker with h/o HTN, ischemic cardiomyopathy, AICD in place (device orders in 06/23/2022 progress note, procedure should not interfere), CAD (DES), PAF (on Eliquis), LBBB, mild to moderate AS, OSA, DM II, CKD Stage III, prostate cancer, bladder cancer, BPH, bladder outlet obstruction scheduled for above procedure 07/01/2022 with Dr. Festus Aloe.   Pt has cardiology follow up with repeat echo scheduled for 07/10/2022.  Discussed with Dr. Lyndal Rainbow office, pt will need repeat echo prior to procedure.   Echo 06/27/2022 stable from previous Echo with EF 20-25%, AV max velocity 1.6 m/s. mean ~7 mmHg, peak gradient 12 mmHg, No significant aortic stenosis.  Anticipate pt can proceed with planned procedure barring acute status change.   VS: BP 100/60   Pulse 61   Temp 36.9 C (Oral)   Resp 16   Ht '5\' 8"'$  (1.727 m)   Wt 101.2 kg   SpO2 94%   BMI 33.91 kg/m   PROVIDERS: [name], MD is PCP   Loralie Champagne, MD is Cardiologist  LABS: Labs reviewed: Acceptable for surgery. (all labs ordered are listed, but only abnormal results are displayed)  Labs Reviewed  HEMOGLOBIN A1C - Abnormal; Notable for the following components:      Result Value   Hgb A1c MFr Bld 9.9 (*)    All other components within normal limits  BASIC METABOLIC PANEL - Abnormal; Notable for the following components:   Potassium 5.2 (*)    Glucose, Bld 190 (*)    BUN 25 (*)    Creatinine, Ser 1.40 (*)    GFR, Estimated 51 (*)    All other components within normal limits  GLUCOSE, CAPILLARY - Abnormal; Notable for the following components:   Glucose-Capillary 229 (*)    All  other components within normal limits  CBC     IMAGES:   EKG: 04/04/2022 Rate 58 bpm  Sinus bradycardia Left axis deviation Left bundle branch block Abnormal ECG When compared with ECG of 25-Feb-2022 11:59, QRS axis Shifted left  CV: Echo 07/19/2020 1. Left ventricular ejection fraction, by estimation, is 25%. The left  ventricle has severely decreased function. The left ventricle demonstrates  global hypokinesis. The left ventricular internal cavity size was  moderately dilated. Left ventricular  diastolic parameters are consistent with Grade I diastolic dysfunction  (impaired relaxation).   2. Right ventricular systolic function is normal. The right ventricular  size is normal. Tricuspid regurgitation signal is inadequate for assessing  PA pressure.   3. Left atrial size was moderately dilated.   4. The mitral valve is normal in structure. No evidence of mitral valve  regurgitation. No evidence of mitral stenosis.   5. The aortic valve is tricuspid. Aortic valve regurgitation is not  visualized. Mild to moderate aortic valve stenosis. Aortic valve mean  gradient measures 11.0 mmHg.   6. The inferior vena cava is normal in size with greater than 50%  respiratory variability, suggesting right atrial pressure of 3 mmHg.  Past Medical History:  Diagnosis Date   AICD (automatic cardioverter/defibrillator) present    Arthritis    Bladder cancer ([place])    Cancer ([place])    bladder and prostate  Cellulitis of left leg    a. 02/2670 complicated by septic shock   Chronic systolic CHF (congestive heart failure) (HCC)    CKD (chronic kidney disease), stage III ([place])    pt denies   Colon polyps    CORONARY ATHEROSCLEROSIS NATIVE CORONARY ARTERY cardiologist-  dr Aundra Dubin   a. 01/2011 Cath/PCI: LM nl, LAD 40-50p, D1 80-small, LCX 95-small, RI 90, RCA 100, EF 20%;  b. 01/2011 Card MRI - No transmural scar;  c. 01/2011 PCI RCA->5 Promus DES, RI->3.0x16 Promus DES; d. Cath 01/13/13 patent LAD  & Ramus, diffuse LCx dz, RCA mult overlapping stents w/ 95% osital stenosis, EF 20%, s/p DES to ostial/prox RCA 01/24/13    Diabetes mellitus, type 2 ([place])    type 2   Hematoma of leg    a. left leg hematoma 03/2012 in the setting of asa/effient   Herpes zoster ophthalmicus    neuropathy   History of anemia    History of kidney stones    History of non-ST elevation myocardial infarction (NSTEMI) 06/2012   History of prostate cancer    dx 2009---  s/p radioactive seed implants 11-24-2008   HYPERLIPIDEMIA    intolerant to Lipitor (myalgias)   HYPERTENSION    Ischemic cardiomyopathy    a. 01/2012 Echo EF 45%, mild LVH; b. IW58%, grade 1 diastolic dysfunction, diffuse hypokinesis, inferoposterior akinesis    Myocardial infarction ([place])    x2   Noncompliance    Obesity    OSA (obstructive sleep apnea)    dx 2012  severe osa  no cpap just uses nose strips   PAF (paroxysmal atrial fibrillation) ([place])    first dx 01-16-2017   Peripheral neuropathy    Polymyalgia rheumatica ([place])    Prostate cancer ([place])    S/P drug eluting coronary stent placement    total 6 DES    Past Surgical History:  Procedure Laterality Date   CARDIAC CATHETERIZATION  01/24/2013   CARDIAC CATHETERIZATION N/A 11/14/2016   Procedure: Right/Left Heart Cath and Coronary Angiography;  Surgeon: Larey Dresser, MD;  Location: [facility];  Service: Cardiovascular;  Laterality: N/A;   CARDIAC CATHETERIZATION N/A 11/17/2016   Procedure: Coronary Stent Intervention w/Impella;  Surgeon: Peter M Martinique, MD;  Location: [facility];  Service: Cardiovascular;  Laterality: N/A;   CARDIAC CATHETERIZATION N/A 11/17/2016   Procedure: Coronary Atherectomy;  Surgeon: Peter M Martinique, MD;  Location: [facility];  Service: Cardiovascular;  Laterality: N/A;   CORONARY ANGIOPLASTY WITH STENT PLACEMENT  01-30-2011   dr Aundra Dubin   PTCA and multiple overlapping DEStenting to RCA and 1 DES to RI    CORONARY BALLOON  ANGIOPLASTY N/A 07/06/2017   Procedure: Coronary Balloon Angioplasty;  Surgeon: Sherren Mocha, MD;  Location: [facility];  Service: Cardiovascular;  Laterality: N/A;   CYSTOSCOPY WITH FULGERATION N/A 05/25/2018   Procedure: CYSTOSCOPY WITH FULGERATION/BLADDER BIOPSY , 15 mm lesion;  Surgeon: Festus Aloe, MD;  [facility];  Service: Urology;  Laterality: N/A;   CYSTOSCOPY WITH URETHRAL DILATATION N/A 08/09/2021   Procedure: CYSTOSCOPY WITH URETHRAL DILATATION WITH OPTILUME;  Surgeon: Festus Aloe, MD;  [facility];  Service: Urology;  Laterality: N/A;   I & D EXTREMITY  06/15/2012   Procedure: IRRIGATION AND DEBRIDEMENT EXTREMITY;  Surgeon: Newt Minion, MD;  Location: [facility];  Service: Orthopedics;  Laterality: Left;  I&D Left Posterior Knee   I & D EXTREMITY  06/30/2012   Procedure: IRRIGATION  AND DEBRIDEMENT EXTREMITY;  Surgeon: Newt Minion, MD;  Location: [facility];  Service: Orthopedics;  Laterality: Left;  Left Leg Irrigation and Debridement and placement of Wound VAC and application of  A-cell   I & D EXTREMITY  07/20/2012   Procedure: IRRIGATION AND DEBRIDEMENT EXTREMITY;  Surgeon: Newt Minion, MD;  Location: [facility];  Service: Orthopedics;  Laterality: Left;  Irrigation and Debridement Left Leg and Place antibiotic beads    ICD IMPLANT N/A 02/12/2017   Procedure: ICD Implant;  Surgeon: Evans Lance, MD;  Location: [facility];  Service: Cardiovascular;  Laterality: N/A;   LAPAROSCOPIC INGUINAL HERNIA REPAIR Right 09-11-2008   dr Barry Dienes  [facility]   LEAD REVISION/REPAIR N/A 05/26/2017   Procedure: Lead Revision/Repair;  Surgeon: Evans Lance, MD;  Location: [facility];  Service: Cardiovascular;  Laterality: N/A;   LEFT HEART CATH AND CORONARY ANGIOGRAPHY N/A 07/06/2017   Procedure: Left Heart Cath and Coronary Angiography;  Surgeon: Larey Dresser, MD;  Location: [facility];  Service: Cardiovascular;  Laterality: N/A;   PERCUTANEOUS CORONARY STENT  INTERVENTION (PCI-S) N/A 01/24/2013   Procedure: PERCUTANEOUS CORONARY STENT INTERVENTION (PCI-S);  Surgeon: Wellington Hampshire, MD;  Location: [facility];  Service: Cardiovascular;  Laterality: N/A;   RADIOACTIVE PROSTATE SEED IMPLANTS  11-24-2008   dr Tresa Endo  [place] TUMOR N/A 10/30/2017   Procedure: TRANSURETHRAL RESECTION OF BLADDER TUMOR (TURBT)/ INSTILLATION OF EPIRUBICIN;  Surgeon: Festus Aloe, MD;  [facility];  Service: Urology;  Laterality: N/A;   TRANSURETHRAL RESECTION OF BLADDER TUMOR N/A 05/18/2020   Procedure: TRANSURETHRAL RESECTION OF BLADDER TUMOR (TURBT) 0.5-2.0 cm BILATERAL RETROGRADE PYELOGRAM;  Surgeon: Festus Aloe, MD;  [facility];  Service: Urology;  Laterality: N/A;   ULTRASOUND GUIDANCE FOR VASCULAR ACCESS  11/14/2016   Procedure: Ultrasound Guidance For Vascular Access;  Surgeon: Larey Dresser, MD;  Location: [facility];  Service: Cardiovascular;;   V TACH ABLATION N/A 06/19/2017   Procedure: V Tach Ablation;  Surgeon: Evans Lance, MD;  Location: [place] [facility];  Service: Cardiovascular;  Laterality: N/A;    MEDICATIONS:  carvedilol (COREG) 12.5 MG tablet   dapagliflozin propanediol (FARXIGA) 10 MG TABS tablet   diphenhydramine-acetaminophen (TYLENOL PM) 25-500 MG TABS tablet   ELIQUIS 5 MG TABS tablet   furosemide (LASIX) 20 MG tablet   glucose blood (ACCU-CHEK AVIVA PLUS) test strip   losartan (COZAAR) 25 MG tablet   methimazole (TAPAZOLE) 10 MG tablet   mexiletine (MEXITIL) 150 MG capsule   Multiple Vitamin (MULTIVITAMIN WITH MINERALS) TABS tablet   Omega-3 Fatty Acids (FISH OIL) 1000 MG CAPS   rosuvastatin (CRESTOR) 20 MG tablet   rosuvastatin (CRESTOR) 40 MG tablet   rosuvastatin (CRESTOR) 40 MG tablet   sodium zirconium cyclosilicate (LOKELMA) 5 g packet   spironolactone (ALDACTONE) 25 MG tablet   tamsulosin (FLOMAX) 0.4 MG CAPS capsule   TRUEplus Lancets 30G MISC   No current  facility-administered medications for this encounter.    Konrad Felix Ward, PA-C [facility] [phone]

## 2022-06-27 ENCOUNTER — Ambulatory Visit (HOSPITAL_COMMUNITY)
Admission: RE | Admit: 2022-06-27 | Discharge: 2022-06-27 | Disposition: A | Discharge status: Home / Self Care | Payer: Medicare Other | Source: Ambulatory Visit | Attending: Cardiology | Admitting: Cardiology

## 2022-06-27 ENCOUNTER — Telehealth: Payer: Self-pay | Admitting: Internal Medicine

## 2022-06-27 DIAGNOSIS — E119 Type 2 diabetes mellitus without complications: Secondary | ICD-10-CM | POA: Diagnosis not present

## 2022-06-27 DIAGNOSIS — E785 Hyperlipidemia, unspecified: Secondary | ICD-10-CM | POA: Diagnosis not present

## 2022-06-27 DIAGNOSIS — I5022 Chronic systolic (congestive) heart failure: Secondary | ICD-10-CM | POA: Diagnosis not present

## 2022-06-27 DIAGNOSIS — I4891 Unspecified atrial fibrillation: Secondary | ICD-10-CM | POA: Insufficient documentation

## 2022-06-27 DIAGNOSIS — I252 Old myocardial infarction: Secondary | ICD-10-CM | POA: Diagnosis not present

## 2022-06-27 DIAGNOSIS — I110 Hypertensive heart disease with heart failure: Secondary | ICD-10-CM | POA: Diagnosis not present

## 2022-06-27 DIAGNOSIS — I509 Heart failure, unspecified: Secondary | ICD-10-CM | POA: Diagnosis not present

## 2022-06-27 DIAGNOSIS — G4730 Sleep apnea, unspecified: Secondary | ICD-10-CM | POA: Diagnosis not present

## 2022-06-27 LAB — ECHOCARDIOGRAM COMPLETE
Area-P 1/2: 3.37 cm2
Calc EF: 30.3 %
S' Lateral: 5.3 cm
Single Plane A2C EF: 24.5 %
Single Plane A4C EF: 33.9 %

## 2022-06-27 NOTE — Progress Notes (Signed)
Remote ICD transmission.   

## 2022-06-27 NOTE — Progress Notes (Signed)
  Echocardiogram 2D Echocardiogram has been performed.  Wayne [name] 06/27/2022, 9:02 AM

## 2022-06-27 NOTE — Telephone Encounter (Signed)
Returned call to patient, he states his pharmacy called him to refill his Rx of Amiodarone. Patient called for clarification on this medication as he has not been taking it.  After reviewing patient's chart, amiodarone was discontinued on 03/13/2022. Patient is not currently prescribed this medication.  Patient verbalized understanding and expressed appreciation for call. Patient states he will let his pharmacy know he no longer takes this medication.

## 2022-06-27 NOTE — Telephone Encounter (Signed)
Pt c/o medication issue:  1. Name of Medication: Amiodarone  2. How are you currently taking this medication (dosage and times per day)? As written  3. Are you having a reaction (difficulty breathing--STAT)? No   4. What is your medication issue?  Pt called asking if he should still be taking this medication. He states he hasn't taken it in a month and it's not on his med list anymore but he would like some clarification.

## 2022-07-01 ENCOUNTER — Encounter (HOSPITAL_COMMUNITY)
Admission: RE | Disposition: A | Discharge status: Home / Self Care | Payer: Self-pay | Source: Ambulatory Visit | Attending: Urology

## 2022-07-01 ENCOUNTER — Ambulatory Visit (HOSPITAL_COMMUNITY)
Admission: RE | Admit: 2022-07-01 | Discharge: 2022-07-01 | Disposition: A | Discharge status: Home / Self Care | Payer: Medicare Other | Source: Ambulatory Visit | Attending: Urology | Admitting: Urology

## 2022-07-01 ENCOUNTER — Ambulatory Visit (HOSPITAL_BASED_OUTPATIENT_CLINIC_OR_DEPARTMENT_OTHER): Payer: Medicare Other | Admitting: Anesthesiology

## 2022-07-01 ENCOUNTER — Ambulatory Visit (HOSPITAL_COMMUNITY): Payer: Medicare Other

## 2022-07-01 ENCOUNTER — Encounter (HOSPITAL_COMMUNITY): Payer: Self-pay | Admitting: Urology

## 2022-07-01 ENCOUNTER — Ambulatory Visit (HOSPITAL_COMMUNITY): Payer: Medicare Other | Admitting: Physician Assistant

## 2022-07-01 DIAGNOSIS — N189 Chronic kidney disease, unspecified: Secondary | ICD-10-CM | POA: Diagnosis not present

## 2022-07-01 DIAGNOSIS — N320 Bladder-neck obstruction: Secondary | ICD-10-CM | POA: Insufficient documentation

## 2022-07-01 DIAGNOSIS — N35912 Unspecified bulbous urethral stricture, male: Secondary | ICD-10-CM | POA: Diagnosis not present

## 2022-07-01 DIAGNOSIS — I130 Hypertensive heart and chronic kidney disease with heart failure and stage 1 through stage 4 chronic kidney disease, or unspecified chronic kidney disease: Secondary | ICD-10-CM | POA: Diagnosis not present

## 2022-07-01 DIAGNOSIS — Z8546 Personal history of malignant neoplasm of prostate: Secondary | ICD-10-CM | POA: Insufficient documentation

## 2022-07-01 DIAGNOSIS — G4730 Sleep apnea, unspecified: Secondary | ICD-10-CM | POA: Diagnosis not present

## 2022-07-01 DIAGNOSIS — M1990 Unspecified osteoarthritis, unspecified site: Secondary | ICD-10-CM | POA: Diagnosis not present

## 2022-07-01 DIAGNOSIS — I110 Hypertensive heart disease with heart failure: Secondary | ICD-10-CM | POA: Diagnosis not present

## 2022-07-01 DIAGNOSIS — E119 Type 2 diabetes mellitus without complications: Secondary | ICD-10-CM

## 2022-07-01 DIAGNOSIS — E669 Obesity, unspecified: Secondary | ICD-10-CM | POA: Insufficient documentation

## 2022-07-01 DIAGNOSIS — I2510 Atherosclerotic heart disease of native coronary artery without angina pectoris: Secondary | ICD-10-CM

## 2022-07-01 DIAGNOSIS — R310 Gross hematuria: Secondary | ICD-10-CM | POA: Diagnosis not present

## 2022-07-01 DIAGNOSIS — E1122 Type 2 diabetes mellitus with diabetic chronic kidney disease: Secondary | ICD-10-CM | POA: Insufficient documentation

## 2022-07-01 DIAGNOSIS — I5022 Chronic systolic (congestive) heart failure: Secondary | ICD-10-CM | POA: Diagnosis not present

## 2022-07-01 DIAGNOSIS — Z7984 Long term (current) use of oral hypoglycemic drugs: Secondary | ICD-10-CM | POA: Diagnosis not present

## 2022-07-01 DIAGNOSIS — E1151 Type 2 diabetes mellitus with diabetic peripheral angiopathy without gangrene: Secondary | ICD-10-CM | POA: Insufficient documentation

## 2022-07-01 DIAGNOSIS — Z8551 Personal history of malignant neoplasm of bladder: Secondary | ICD-10-CM | POA: Insufficient documentation

## 2022-07-01 DIAGNOSIS — N35919 Unspecified urethral stricture, male, unspecified site: Secondary | ICD-10-CM | POA: Diagnosis not present

## 2022-07-01 DIAGNOSIS — Z955 Presence of coronary angioplasty implant and graft: Secondary | ICD-10-CM | POA: Insufficient documentation

## 2022-07-01 DIAGNOSIS — Z87891 Personal history of nicotine dependence: Secondary | ICD-10-CM

## 2022-07-01 DIAGNOSIS — M353 Polymyalgia rheumatica: Secondary | ICD-10-CM | POA: Insufficient documentation

## 2022-07-01 DIAGNOSIS — I252 Old myocardial infarction: Secondary | ICD-10-CM | POA: Insufficient documentation

## 2022-07-01 DIAGNOSIS — N99111 Postprocedural bulbous urethral stricture: Secondary | ICD-10-CM

## 2022-07-01 DIAGNOSIS — Z6833 Body mass index (BMI) 33.0-33.9, adult: Secondary | ICD-10-CM | POA: Insufficient documentation

## 2022-07-01 HISTORY — PX: CYSTOSCOPY WITH URETHRAL DILATATION: SHX5125

## 2022-07-01 LAB — GLUCOSE, CAPILLARY
Glucose-Capillary: 214 mg/dL — ABNORMAL HIGH (ref 70–99)
Glucose-Capillary: 225 mg/dL — ABNORMAL HIGH (ref 70–99)
Glucose-Capillary: 183 mg/dL — ABNORMAL HIGH (ref 70–99)

## 2022-07-01 SURGERY — CYSTOSCOPY, WITH URETHRAL DILATION
Anesthesia: General

## 2022-07-01 MED ORDER — FENTANYL CITRATE PF 50 MCG/ML IJ SOSY
25.0000 ug | PREFILLED_SYRINGE | INTRAMUSCULAR | Status: DC | PRN
  Administered 2022-07-01: 50 ug via INTRAVENOUS
  Administered 2022-07-01 (×2): 25 ug via INTRAVENOUS

## 2022-07-01 MED ORDER — ORAL CARE MOUTH RINSE: 15.0000 mL | Freq: Once | OROMUCOSAL | Status: AC

## 2022-07-01 MED ORDER — STERILE WATER FOR IRRIGATION IR SOLN
Status: DC | PRN
  Administered 2022-07-01: 3000 mL via INTRAVESICAL

## 2022-07-01 MED ORDER — ACETAMINOPHEN 500 MG PO TABS
1000.0000 mg | ORAL_TABLET | Freq: Once | ORAL | Status: AC
  Administered 2022-07-01: 1000 mg via ORAL
  Filled 2022-07-01: qty 2

## 2022-07-01 MED ORDER — LACTATED RINGERS IV SOLN: INTRAVENOUS | Status: DC

## 2022-07-01 MED ORDER — CHLORHEXIDINE GLUCONATE 0.12 % MT SOLN
15.0000 mL | Freq: Once | OROMUCOSAL | Status: AC
  Administered 2022-07-01: 15 mL via OROMUCOSAL

## 2022-07-01 MED ORDER — INSULIN ASPART 100 UNIT/ML IJ SOLN
INTRAMUSCULAR | Status: AC
  Filled 2022-07-01: qty 1

## 2022-07-01 MED ORDER — MIDAZOLAM HCL 2 MG/2ML IJ SOLN
INTRAMUSCULAR | Status: AC
  Filled 2022-07-01: qty 2

## 2022-07-01 MED ORDER — LIDOCAINE HCL (CARDIAC) PF 100 MG/5ML IV SOSY
PREFILLED_SYRINGE | INTRAVENOUS | Status: DC | PRN
  Administered 2022-07-01: 100 mg via INTRAVENOUS

## 2022-07-01 MED ORDER — FENTANYL CITRATE (PF) 100 MCG/2ML IJ SOLN
INTRAMUSCULAR | Status: AC
  Filled 2022-07-01: qty 2

## 2022-07-01 MED ORDER — ONDANSETRON HCL 4 MG/2ML IJ SOLN: 4.0000 mg | Freq: Once | INTRAMUSCULAR | Status: DC | PRN

## 2022-07-01 MED ORDER — PROPOFOL 10 MG/ML IV BOLUS
INTRAVENOUS | Status: DC | PRN
  Administered 2022-07-01: 20 mg via INTRAVENOUS
  Administered 2022-07-01: 30 mg via INTRAVENOUS
  Administered 2022-07-01: 120 mg via INTRAVENOUS

## 2022-07-01 MED ORDER — MIDAZOLAM HCL 5 MG/5ML IJ SOLN
INTRAMUSCULAR | Status: DC | PRN
  Administered 2022-07-01: 2 mg via INTRAVENOUS

## 2022-07-01 MED ORDER — IOHEXOL 300 MG/ML  SOLN
INTRAMUSCULAR | Status: DC | PRN
  Administered 2022-07-01: 10 mL

## 2022-07-01 MED ORDER — INSULIN ASPART 100 UNIT/ML IJ SOLN
2.0000 [IU] | INTRAMUSCULAR | Status: AC
Start: 2022-07-01 — End: 2022-07-01
  Administered 2022-07-01: 2 [IU] via SUBCUTANEOUS

## 2022-07-01 MED ORDER — LIDOCAINE HCL URETHRAL/MUCOSAL 2 % EX GEL
CUTANEOUS | Status: DC | PRN
  Administered 2022-07-01: 1 via URETHRAL

## 2022-07-01 MED ORDER — DEXAMETHASONE SODIUM PHOSPHATE 4 MG/ML IJ SOLN
INTRAMUSCULAR | Status: DC | PRN
  Administered 2022-07-01: 10 mg via INTRAVENOUS

## 2022-07-01 MED ORDER — CIPROFLOXACIN IN D5W 400 MG/200ML IV SOLN
400.0000 mg | Freq: Once | INTRAVENOUS | Status: AC
  Administered 2022-07-01: 400 mg via INTRAVENOUS
  Filled 2022-07-01: qty 200

## 2022-07-01 MED ORDER — ONDANSETRON HCL 4 MG/2ML IJ SOLN
INTRAMUSCULAR | Status: DC | PRN
  Administered 2022-07-01: 4 mg via INTRAVENOUS

## 2022-07-01 MED ORDER — NITROFURANTOIN MONOHYD MACRO 100 MG PO CAPS: 100.0000 mg | ORAL_CAPSULE | Freq: Every day | ORAL | 0 refills | Status: DC

## 2022-07-01 MED ORDER — APIXABAN 5 MG PO TABS: 5.0000 mg | ORAL_TABLET | Freq: Two times a day (BID) | ORAL | 2 refills | Status: DC

## 2022-07-01 MED ORDER — FENTANYL CITRATE PF 50 MCG/ML IJ SOSY
PREFILLED_SYRINGE | INTRAMUSCULAR | Status: AC
  Filled 2022-07-01: qty 1

## 2022-07-01 MED ORDER — LIDOCAINE HCL URETHRAL/MUCOSAL 2 % EX GEL
CUTANEOUS | Status: AC
  Filled 2022-07-01: qty 30

## 2022-07-01 MED ORDER — PHENYLEPHRINE HCL (PRESSORS) 10 MG/ML IV SOLN
INTRAVENOUS | Status: DC | PRN
  Administered 2022-07-01: 160 ug via INTRAVENOUS

## 2022-07-01 SURGICAL SUPPLY — 23 items
BAG DRN RND TRDRP ANRFLXCHMBR (UROLOGICAL SUPPLIES) ×1
BAG URINE DRAIN 2000ML AR STRL (UROLOGICAL SUPPLIES) ×2 IMPLANT
BALLN NEPHROSTOMY (BALLOONS)
BALLN OPTILUME DCB 30X5X75 (BALLOONS) ×2
BALLOON NEPHROSTOMY (BALLOONS) IMPLANT
BALLOON OPTILUME DCB 30X5X75 (BALLOONS) IMPLANT
CATH FOLEY 2W COUNCIL 5CC 16FR (CATHETERS) ×2 IMPLANT
CATH SET URETHRAL DILATOR (CATHETERS) ×1 IMPLANT
CATH URETL 5X70 OPEN END (CATHETERS) ×1 IMPLANT
CATH URETL OPEN END 6FR 70 (CATHETERS) IMPLANT
CLOTH BEACON ORANGE TIMEOUT ST (SAFETY) ×2 IMPLANT
DEVICE INFLATION ATRION QL4015 (MISCELLANEOUS) ×1 IMPLANT
GLOVE BIO SURGEON STRL SZ7.5 (GLOVE) ×2 IMPLANT
GOWN STRL REUS W/ TWL XL LVL3 (GOWN DISPOSABLE) ×1 IMPLANT
GOWN STRL REUS W/TWL XL LVL3 (GOWN DISPOSABLE) ×2
GUIDEWIRE ANG ZIPWIRE 038X150 (WIRE) IMPLANT
GUIDEWIRE STR DUAL SENSOR (WIRE) ×2 IMPLANT
GUIDEWIRE SUPER STIFF (WIRE) ×1 IMPLANT
MANIFOLD NEPTUNE II (INSTRUMENTS) IMPLANT
NS IRRIG 1000ML POUR BTL (IV SOLUTION) IMPLANT
PACK CYSTO (CUSTOM PROCEDURE TRAY) ×2 IMPLANT
PENCIL SMOKE EVACUATOR (MISCELLANEOUS) IMPLANT
WATER STERILE IRR 3000ML UROMA (IV SOLUTION) ×2 IMPLANT

## 2022-07-01 NOTE — Anesthesia Procedure Notes (Signed)
Procedure Name: LMA Insertion Date/Time: 07/01/2022 1:07 PM  Performed by: Tierany Appleby, Forest Gleason, CRNAPre-anesthesia Checklist: Patient identified, Emergency Drugs available, Suction available, Patient being monitored and Timeout performed Patient Re-evaluated:Patient Re-evaluated prior to induction Oxygen Delivery Method: Circle system utilized Preoxygenation: Pre-oxygenation with 100% oxygen Induction Type: IV induction Ventilation: Mask ventilation without difficulty LMA: LMA inserted LMA Size: 5.0 Number of attempts: 1 Dental Injury: Teeth and Oropharynx as per pre-operative assessment

## 2022-07-01 NOTE — Transfer of Care (Signed)
Immediate Anesthesia Transfer of Care Note  Patient: Wayne [name]  Procedure(s) Performed: CYSTOSCOPY WITH BALLOON DILATATION OF PROSTATIC URETHRA  Patient Location: PACU  Anesthesia Type:General  Level of Consciousness: awake  Airway & Oxygen Therapy: Patient Spontanous Breathing  Post-op Assessment: Report given to RN  Post vital signs: stable  Last Vitals:  Vitals Value Taken Time  BP 115/63 07/01/22 1405  Temp    Pulse 58 07/01/22 1407  Resp 21 07/01/22 1407  SpO2 99 % 07/01/22 1407  Vitals shown include unvalidated device data.  Last Pain:  Vitals:   07/01/22 1109  TempSrc:   PainSc: 0-No pain         Complications: No notable events documented.

## 2022-07-01 NOTE — Interval H&P Note (Signed)
History and Physical Interval Note:  07/01/2022 12:56 PM  Wayne [name]  has presented today for surgery, with the diagnosis of BENIGN PROSTATIC HYPERPLASIA, BLADDER OUTLET OBSTRUCTION.  The various methods of treatment have been discussed with the patient and family. After consideration of risks, benefits and other options for treatment, the patient has consented to  Procedure(s): CYSTOSCOPY WITH BALLOON DILATATION OF PROSTATIC URETHRA (N/A) as a surgical intervention.  The patient's history has been reviewed, patient examined, no change in status, stable for surgery.  I have reviewed the patient's chart and labs. He is well with no dysuria or hematuria. Slow stream. Again disc risk of dilation with recurrence and incontinence among others. Questions were answered to the patient's satisfaction.  He elects to proceed.    Festus Aloe

## 2022-07-01 NOTE — Op Note (Signed)
Preoperative diagnosis: Urethral stricture Postoperative diagnosis: Same  Procedure: OptiLume balloon dilation of urethral stricture  Surgeon: Junious Silk  Anesthesia: General  Indication for procedure: Wayne [name] is a 81 year old male with refractory urethral stricture.  Findings: On cystoscopy the urethra was unremarkable until the proximal bulb and there was a pinpoint stricture.  Once past the stricture the prostatic urethra appeared to be patent and the bladder appeared normal without stone or foreign body.  The urine was somewhat cloudy and there was some mild bleeding from passing the dilators so although not a diagnostic cystoscopy I did not see any obvious bladder lesions or papillary tumors.  The ureteral orifice ease appeared in their normal orthotopic position.  Description of procedure: After consent was obtained patient brought to the operating room.  After adequate anesthesia was placed lithotomy position and prepped and draped in the usual sterile fashion.  Timeout was performed to confirm the patient and procedure.  The cystoscope was passed per urethra where pinpoint stricture was noted in the bulbar urethra.  Initially I could not get the wire to pass but then an angled and passed and coiled in the bladder.  To be certain I passed a 5 [place] open-ended catheter over the wire into the bladder all under fluoroscopic guidance and remove the wire.  There was a drip of urine consistent with the bladder.  To be certain injected about 30 cc of dilute contrast and the bladder was visualized on a quick cystogram.  An Amplatz Super Stiff wire was then coiled in the bladder and the 5 [place] open-ended catheter backed out.  I then used the [facility] urethral dilators to dilate from 8-20 [place].  I then passed a 91 [place] rigid scope and was able to angle that and then carefully guided into the bladder where the wire was confirmed to be in the lumen of the prostatic urethra and the lumen of the bladder.  The  bladder was inspected and the scope backed out.  Under direct vision and then passed the OptiLume balloon with the stricture in the middle of the 5 cm 30 French balloon. The balloon was allowed to pre-soak. Balloon was inflated to 10 atm for 5 minutes.  There was some slight wasting in the middle of the balloon at the stricture but just a small amount.  This wasting never completely went away.  After 5 minutes the balloon was deflated and backed out and a 16 [place] Foley catheter was advanced into the bladder the balloon inflated and it was seated at the bladder neck.  Urine drainage was clear.  He was awakened taken the cover room in stable condition.  Complications: None  Blood loss: Minimal  Specimens: None  Drains: 16 French Foley  Disposition: Patient stable to PACU-I discussed the procedure, postop care and follow-up with his wife.  We will keep him on a nightly antibiotic.

## 2022-07-01 NOTE — Anesthesia Preprocedure Evaluation (Addendum)
Anesthesia Evaluation  Patient identified by MRN, date of birth, ID band Patient awake    Reviewed: Allergy & Precautions, NPO status , Patient's Chart, lab work & pertinent test results  Airway Mallampati: I  TM Distance: >3 FB Neck ROM: Full    Dental  (+) Dental Advisory Given, Edentulous Upper, Edentulous Lower   Pulmonary sleep apnea , former smoker,    Pulmonary exam normal breath sounds clear to auscultation       Cardiovascular hypertension, Pt. on medications + CAD, + Past MI, + Cardiac Stents, + Peripheral Vascular Disease and +CHF  Normal cardiovascular exam+ dysrhythmias Atrial Fibrillation + Cardiac Defibrillator  Rhythm:Regular Rate:Normal  Echo 06/27/22: 1. Left ventricular ejection fraction, by estimation, is 20 to 25%. The left ventricle has severely decreased function. The left ventricle demonstrates global hypokinesis. The left ventricular internal cavity size was moderately dilated. Left ventricular diastolic parameters are consistent with Grade II diastolic dysfunction (pseudonormalization).  2. Right ventricular systolic function is moderately reduced. The right ventricular size is mildly enlarged. There is normal pulmonary artery systolic pressure.  3. Left atrial size was moderately dilated.  4. The mitral valve is grossly normal. No evidence of mitral valve regurgitation.  5. AV max velocity 1.6 m/s. mean ~7 mmHg, peak gradient 12 mmHg. No significant aortic stenosis. The aortic valve is calcified. Aortic valve regurgitation is not visualized.  6. The inferior vena cava is normal in size with greater than 50% respiratory variability, suggesting right atrial pressure of 3 mmHg.   Neuro/Psych  Neuromuscular disease    GI/Hepatic negative GI ROS, Neg liver ROS,   Endo/Other  diabetes, Type 2, Oral Hypoglycemic AgentsObesity   Renal/GU Renal InsufficiencyRenal disease   Bladder cancer BENIGN PROSTATIC  HYPERPLASIA, BLADDER OUTLET OBSTRUCTION    Musculoskeletal  (+) Arthritis , Polymyalgia rheumatica    Abdominal   Peds  Hematology  (+) Blood dyscrasia (Eliquis), ,   Anesthesia Other Findings Day of surgery medications reviewed with the patient.  Reproductive/Obstetrics                            Anesthesia Physical Anesthesia Plan  ASA: 4  Anesthesia Plan: General   Post-op Pain Management: Tylenol PO (pre-op)*   Induction: Intravenous  PONV Risk Score and Plan: 2 and Dexamethasone and Ondansetron  Airway Management Planned: LMA  Additional Equipment:   Intra-op Plan:   Post-operative Plan: Extubation in OR  Informed Consent: I have reviewed the patients History and Physical, chart, labs and discussed the procedure including the risks, benefits and alternatives for the proposed anesthesia with the patient or authorized representative who has indicated his/her understanding and acceptance.     Dental advisory given  Plan Discussed with: CRNA  Anesthesia Plan Comments: (Etomidate for induction)       Anesthesia Quick Evaluation

## 2022-07-01 NOTE — H&P (Signed)
Office Visit Report     06/04/2022   --------------------------------------------------------------------------------   Wayne [name]. [place]  MRN: [id number]  DOB: 29-Aug-1941, 81 year old Male  SSN: [id number]   PRIMARY CARE:  Carolann Littler, MD  REFERRING:  Georgette Dover, MD  PROVIDER:  Festus Aloe, M.D.  LOCATION:  [facility] [phone]     --------------------------------------------------------------------------------   CC/HPI: F/u -   1) h/o bladder ca - 2018 HG Ta and 2021 small T1 disease.   His last U/S or CT Scan was 05/17/2020. US done 11/21 for gross hematuria - benign.   Prior bx:  November 2018 TURBT-left HG Ta + epirubicin.  June 2019 left CIS + epirubicin  June 2021 trigone HG T1 (focal T1) 1 cm lesion, normal RGP, gemcitabine   BCG: Sep 2019 BCG x 6   Cysto 12/21, benign with membranous stricture and same 03/22. Cysto June 2022 without obvious tumor but erythema and cystitis from foley. Cysto in OR Aug 2022 without tumor. Cysto 06/23 benign.    2) h/o PCa - brachytherapy for CaP in 2009 for a T1c, PSA 4.39, Gleason 3+3=6 three cores left, 39 gram prostate    3) urinary retention, membranous urethral sx - pt with weak stream. Retention 03/20. Scope dilated a sx 09/21. Pt c/o dysuria, hematuria and weak stream. On cysto 12/21 required passage of a 14 Fr and 18 Fr catheter before scope would advance. PVR 272 ml. Urine is clear.   Sx dilated 03/22, to 20 Fr over a wire. Foley placed. Void trial passed. Went into retention again 04/22 and passed. Retention again 05/22. Took foley out June 2022. Cysto June 2022 with mild sx and scope passed right into bladder. Scheduled UDS.   He underwent OptiLume sx dilation Aug 2022 after he presented for UDS with a weak stream. Cristopher Estimable nor I could pass UDS catheter. I couldn't pass a 14 Fr coude.   He had increased LUTS (worsening frequency and weak stream. PVR 190 ml.) and cysto 06/23 revealed the  membranous sx was patent but the prostatic urethra remained tight and serpiginous. He had retention p and Dr. Obie Dredge needed to pass a council over a wire (the scope would pass and but a catheter wouldn't).    Today, seen for the above. He removed his foley at home last week. [place] and he noticed a weaker stream and he is dribbling some urine into his shorts. Some mild hematuria with some light red staining of his shorts but no clots. He admittedly was not drinking a lot of water. Since getting home yesterday evening he has been drinking more water and had a more normal stream. He is less concerned today but wanted to make sure. His bladder scan is only 34 mL. He is got 20-40 red blood cells and rare bacteria. No fever. He does have some mild dysuria and some burning when he sits.     ALLERGIES: Entresto Lipitor TABS    MEDICATIONS: Carvedilol 25 mg tablet  Eliquis 5 mg tablet  Farxiga 10 mg tablet  Fish Oil  Furosemide 20 mg tablet  Losartan Potassium 25 mg tablet  Multiple Vitamin  Rosuvastatin Calcium capsule  Spironolactone 25 mg tablet tablet  Tylenol Pm Extra Strength 500 mg-25 mg tablet     GU PSH: Bladder Instill AntiCA Agent - 2021, 2019, 2019, 2019, 2019, 2019, 2019, 2019, 2018 Catheterization For Collection Of Specimen, Single Patient, All Places Of Service -  01/08/2021 Catheterize For Residual - 01/08/2021 Cysto Dilate Stricture (M or F) - 08/09/2021, 07/19/2021, 02/21/2021 Cystoscopy - 05/22/2022, 05/21/2022, 05/30/2021, 12/03/2020, 09/03/2020, 2021, 12/12/2019, 2020, 2020, 2019, 2019, 2019, 2018 Cystoscopy TURBT <2 cm - 2021, 2019 Cystoscopy TURBT 2-5 cm - 2018 Insert Bladder Cath; Complex - 05/22/2022 Locm 300-'399Mg'$ /Ml Iodine,1Ml - 2018 TRANSPERI NEEDLE PLACE, PROS - 2009       PSH Notes: Leg Repair, Surgery Prostate Transperineal Placement Of Needles, Hand Surgery   NON-GU PSH: No Non-GU PSH    GU PMH: Bladder-neck stenosis/contracture -  05/26/2022 Incomplete bladder emptying, They know how to remove the foley. FOM Wednesday AM. We discussed we don't know if his symptoms are from a weak bladder, obstruction or both. UDS, catheter placement and CIC have all been very difficult. Discussed the nature r/b/a to balloon dilation of the prostatic urethra and urethra (not Optilume) to see if that will ease some of his issues. Disc risk of recurrence and incontinence among others. Also discussed SP tube placement. He will proceed with balloon dilation. - 05/26/2022, (Stable), - 05/22/2022, check Urodynamics - consider PUL . Passed VT voided p scope removal. Cover with nitrofurantoin - urine cx hospital with enterococcus S to NF. , - 05/30/2021, - 04/17/2021, - 10/04/2020, - 2020 Weak Urinary Stream, I gave him a prescription for large [employer] ultra underwear. Hand written. - 05/26/2022 History of bladder cancer, NED -pretty good visual today with the HD scope. - 05/21/2022, (Stable), no obvious cancer today , - 05/30/2021, Cysto in 3 mo , - 03/06/2021, Benign cysto today , - 12/03/2020, clear cysto today. , - 09/03/2020 (Stable), - 2020, - 2019 Membranous urethral stricture, no clinically significant stricture - could be BPH or OAB - 05/21/2022, I sent in cephalexin and discussed the nature r/b/a to Optilume stent balloon dilation with patient including risk of recurrent sx and incontinence among others - he elects to proceed. , - 07/19/2021, - 05/30/2021, - 05/16/2021, He asked about long term sx management and we went over nature r/b of dilations and surgical repair. Also, SP tube and allowing sx to close off. He will consider. , - 03/06/2021, s/p foley. Void trial in 5 -6 days. , - 02/21/2021, - 01/08/2021, progressing - scope in 3 months , - 12/03/2020 Acute prostatitis - 03/17/2022 Gross hematuria - 03/17/2022, - 01/08/2021 (Stable), benign eval , - 12/03/2020, - 11/05/2020, - 10/04/2020, - 2020, - 2020, - 2018, - 2018, - 2018 BPH w/o LUTS, increase tams BID - 05/30/2021,  Benign prostatic hypertrophy without lower urinary tract symptoms, - 2014 Bladder Cancer overlapping sites - 05/16/2021, - 10/11/2020 History of prostate cancer - 05/16/2021, - 2020 Urinary Retention - 05/16/2021, - 04/03/2021, - 03/27/2021 (Stable), - 10/11/2020, - 2020, - 2020 Bladder Cancer Lateral - 10/04/2020, - 2020, - 2019 Bladder Cancer Trigone (Stable), We discussed BCG but he is not keen on restarting. It was a small lesion although HG and focal T1. Monitor closely. Check cysto in 3 mo. - 06/06/2020, plan TURBT and gemcitabine - discussed nature r/b/a with pt. , - 2021, - 12/12/2019 Epididymo-orchitis - 2020, - 2020 ED due to arterial insufficiency - 2019, Erectile dysfunction due to arterial insufficiency, - 2017 Urinary Urgency - 2018 Bladder tumor/neoplasm - 2018 Prostate Cancer, Prostate cancer - 2017 Urinary Frequency, Increased urinary frequency - 2017 Urge incontinence, Urge incontinence of urine - 2015 Elevated PSA, Elevated prostate specific antigen (PSA) - 2015 Nocturia, Nocturia - 2014 Unil Inguinal Hernia W/O obst or gang,non-recurrent, Inguinal hernia, unilateral - 2014  NON-GU PMH: Encounter for general adult medical examination without abnormal findings, Encounter for preventive health examination - 2015 Personal history of other diseases of the circulatory system, History of hypertension - 2014 Personal history of other endocrine, nutritional and metabolic disease, History of hypercholesterolemia - 2014    FAMILY HISTORY: Acute Myocardial Infarction - Father Cancer - Mother Death In The Family Father - Father Death In The Family Mother - Mother Family Health Status Number - Runs In Family   SOCIAL HISTORY: Marital Status: Married Preferred Language: English; Ethnicity: Not Hispanic Or Latino; Race: White    REVIEW OF SYSTEMS:    GU Review Male:   Patient reports burning/ pain with urination. Patient denies frequent urination, hard to postpone urination, get up at  night to urinate, leakage of urine, stream starts and stops, trouble starting your stream, have to strain to urinate , erection problems, and penile pain.  Gastrointestinal (Upper):   Patient denies nausea, vomiting, and indigestion/ heartburn.  Gastrointestinal (Lower):   Patient denies diarrhea and constipation.  Constitutional:   Patient denies fever, night sweats, weight loss, and fatigue.  Skin:   Patient denies skin rash/ lesion and itching.  Eyes:   Patient denies blurred vision and double vision.  Ears/ Nose/ Throat:   Patient denies sore throat and sinus problems.  Hematologic/Lymphatic:   Patient denies swollen glands and easy bruising.  Cardiovascular:   Patient denies leg swelling and chest pains.  Respiratory:   Patient denies cough and shortness of breath.  Endocrine:   Patient denies excessive thirst.  Musculoskeletal:   Patient denies back pain and joint pain.  Neurological:   Patient denies headaches and dizziness.  Psychologic:   Patient denies anxiety and depression.   VITAL SIGNS: None   MULTI-SYSTEM PHYSICAL EXAMINATION:    Constitutional: Well-nourished. No physical deformities. Normally developed. Good grooming.  Neck: Neck symmetrical, not swollen. Normal tracheal position.  Respiratory: No labored breathing, no use of accessory muscles.   Cardiovascular: Normal temperature, normal extremity pulses, no swelling, no varicosities.  Skin: No paleness, no jaundice, no cyanosis. No lesion, no ulcer, no rash.  Neurologic / Psychiatric: Oriented to time, oriented to place, oriented to person. No depression, no anxiety, no agitation.  Gastrointestinal: No mass, no tenderness, no rigidity, non obese abdomen.     Complexity of Data:   10/10/19 12/27/14 03/13/14 05/06/10 10/19/09 04/20/09 01/19/09 08/07/08  PSA  Total PSA <0.015 ng/mL 0.09  0.11  1.55  1.11  2.73  4.22  4.39     PROCEDURES:         PVR Ultrasound - 51798  Scanned Volume: 34 cc         Urinalysis  w/Scope Dipstick Dipstick Cont'd Micro  Color: Yellow Bilirubin: Neg mg/dL WBC/hpf: 0 - 5/hpf  Appearance: Clear Ketones: Neg mg/dL RBC/hpf: 20 - 40/hpf  Specific Gravity: 1.020 Blood: 3+ ery/uL Bacteria: Rare (0-9/hpf)  pH: 6.0 Protein: 1+ mg/dL Cystals: NS (Not Seen)  Glucose: 2+ mg/dL Urobilinogen: 0.2 mg/dL Casts: NS (Not Seen)    Nitrites: Neg Trichomonas: Not Present    Leukocyte Esterase: Neg leu/uL Mucous: Not Present      Epithelial Cells: NS (Not Seen)      Yeast: NS (Not Seen)      Sperm: Not Present    ASSESSMENT:      ICD-10 Details  1 GU:   Bladder-neck stenosis/contracture - N32.0 Chronic, Stable - Keep plan for dilation of the prostatic urethra, in the operating room. We discussed replacing  the Foley today but he would like to continue to drink more water and monitor.  2   Gross hematuria - R31.0 Chronic, Stable - I will send the urine for culture. We will going to check the bladder again in the operating room soon. Start cephalexin.   3   History of bladder cancer - Z85.51 Chronic, Stable - We will reassess bladder in the OR, I will go ahead and get a CT scan as its been sometime since his last upper tract imaging.  4   History of prostate cancer - Z85.46 Chronic, Stable - PSA was sent as well as BUN and creatinine   PLAN:            Medications New Meds: Cephalexin 500 mg capsule 1 capsule PO BID   #10  0 Refill(s)  Pharmacy Name:  [id number] Address:  [address]  [place] [address] Phone:  [phone] Fax:  [phone]           Orders Labs BUN/Creatinine, PSA, Urine Culture  X-Rays: C.T. Abdomen/Pelvis With and Without I.V. Contrast          Schedule Return Visit/Planned Activity: Keep Scheduled Appointment - Follow up MD          Document Letter(s):  Created for Patient: Clinical Summary         Notes:   cc: Dr. BElease Hashimoto        Next Appointment:      Next Appointment: 07/01/2022 12:30 PM    Appointment Type: Surgery      Location: [facility] -[phone]   Provider: MFestus Aloe M.D.    Reason for Visit: NE/OP CYSTO, BALLOON DILATION OF PROSTATIC URETHRA      * Signed by MFestus Aloe M.D. on 06/04/22 at 5:14 PM (EDT)*      The information contained in this medical record document is considered private and confidential patient information. This information can only be used for the medical diagnosis and/or medical services that are being provided by the patient's selected caregivers. This information can only be distributed outside of the patient's care if the patient agrees and signs waivers of authorization for this information to be sent to an outside source or route.  Add: urine cx no growth.

## 2022-07-01 NOTE — Discharge Instructions (Signed)
Urethral Dilation  Urethral dilation is a procedure to stretch open (dilate) the urethra. The urethra is the tube that drains urine from the bladder out of the body. In women, the urethra opens above the vaginal opening. In men, the urethra opens at the tip of the penis. Urethral dilation is usually done to treat narrowing of the urethra (urethral stricture), which can make it difficult to pass urine. Urethral dilation widens the urethra so that you can pass urine normally. Urethral dilation is done through the urethral opening. There are no incisions made during the procedure.  What are the risks? Generally, this is a safe procedure. However, problems may occur, including: Bleeding. Infection. A return of urethral stricture, which requires repeating the dilation procedure. Damage to the urethra, which may require reconstructive surgery. Allergic reactions to medicines.  What can I expect after the procedure? After the procedure, it is common to have: Burning pain when urinating. Blood in your urine. A need to urinate frequently. You will be asked to urinate before you leave the hospital or clinic. Your urine flow should improve within a few days. Follow these instructions at home: Medicines Take over-the-counter and prescription medicines only as told by your health care provider. If you were prescribed an antibiotic medicine, take it as told by your health care provider. Do not stop taking the antibiotic even if you start to feel better. Ask your health care provider if the medicine prescribed to you: Requires you to avoid driving or using heavy machinery. Can cause constipation. You may need to take these actions to prevent or treat constipation: Take over-the-counter or prescription medicines. Eat foods that are high in fiber, such as beans, whole grains, and fresh fruits and vegetables. Limit foods that are high in fat and processed sugars, such as fried or sweet foods. General  instructions Do not drive for 24 hours if you were given a sedative during your procedure. If you were sent home with a small, lubricated tube (catheter) to help keep your urethra open, follow your health care provider's instructions about how and when to use it. Drink enough fluid to keep your urine pale yellow. Return to your normal activities as told by your health care provider. Ask your health care provider what activities are safe for you. Keep all follow-up visits as told by your health care provider. This is important. Contact a health care provider if: Your urine is cloudy and smells bad. You develop new bleeding when you urinate. You pass blood clots when you urinate. You have pain that does not get better with medicine. You have a fever. You have swelling, bruising, or discoloration of your genital area. This includes the penis, scrotum, and inner thighs for men, and the outer genital organs (vulva) and inner thighs for women. Get help right away if: You develop new bleeding that does not stop. You cannot pass urine. Summary Urethral dilation is a procedure to stretch open (dilate) the urethra. Urethral dilation is usually done to treat narrowing of the urethra (urethral stricture), which can make it difficult to pass urine. Ask your health care provider about changing or stopping your regular medicines before the procedure. After the procedure, it is common to have burning pain when urinating, blood in your urine, and a need to urinate frequently. This information is not intended to replace advice given to you by your health care provider. Make sure you discuss any questions you have with your health care provider. Document Revised: 01/13/2019 Document Reviewed: 01/13/2019  Elsevier Patient Education  [place].

## 2022-07-02 ENCOUNTER — Encounter (HOSPITAL_COMMUNITY): Payer: Self-pay | Admitting: Urology

## 2022-07-02 NOTE — Anesthesia Postprocedure Evaluation (Signed)
Anesthesia Post Note  Patient: [place]  Procedure(s) Performed: CYSTOSCOPY WITH BALLOON DILATATION OF PROSTATIC URETHRA     Patient location during evaluation: PACU Anesthesia Type: General Level of consciousness: awake and alert Pain management: pain level controlled Vital Signs Assessment: post-procedure vital signs reviewed and stable Respiratory status: spontaneous breathing, nonlabored ventilation, respiratory function stable and patient connected to nasal cannula oxygen Cardiovascular status: blood pressure returned to baseline and stable Postop Assessment: no apparent nausea or vomiting Anesthetic complications: no   No notable events documented.  Last Vitals:  Vitals:   07/01/22 1515 07/01/22 1530  BP:    Pulse: (!) 57 (!) 57  Resp: 13 16  Temp:    SpO2: 98% 98%    Last Pain:  Vitals:   07/01/22 1450  TempSrc:   PainSc: 2    Pain Goal: Patients Stated Pain Goal: 3 (07/01/22 1450)                 Santa Lighter

## 2022-07-08 DIAGNOSIS — N35011 Post-traumatic bulbous urethral stricture: Secondary | ICD-10-CM | POA: Diagnosis not present

## 2022-07-10 ENCOUNTER — Encounter (HOSPITAL_COMMUNITY): Payer: Medicare Other | Admitting: Cardiology

## 2022-07-10 ENCOUNTER — Other Ambulatory Visit (HOSPITAL_COMMUNITY): Payer: Medicare Other

## 2022-07-14 ENCOUNTER — Ambulatory Visit (INDEPENDENT_AMBULATORY_CARE_PROVIDER_SITE_OTHER): Payer: Medicare Other

## 2022-07-14 DIAGNOSIS — Z95810 Presence of automatic (implantable) cardiac defibrillator: Secondary | ICD-10-CM | POA: Diagnosis not present

## 2022-07-14 DIAGNOSIS — I5022 Chronic systolic (congestive) heart failure: Secondary | ICD-10-CM | POA: Diagnosis not present

## 2022-07-16 DIAGNOSIS — N3000 Acute cystitis without hematuria: Secondary | ICD-10-CM | POA: Diagnosis not present

## 2022-07-16 DIAGNOSIS — R338 Other retention of urine: Secondary | ICD-10-CM | POA: Diagnosis not present

## 2022-07-17 NOTE — Progress Notes (Signed)
EPIC Encounter for ICM Monitoring  Patient Name: Wayne [name] is a 81 y.o. male Date: 07/17/2022 Primary Care Physican: [name], MD Primary Cardiologist: Aundra Dubin Electrophysiologist: Lovena Le 04/01/2022 Weight: 230 lbs  07/17/2022 Weight: 228 lbs         Spoke with patient and heart failure questions reviewed.  Pt asymptomatic for fluid accumulation. He had surgery recently is being treated for infection.  He has not felt well and canceled 7/27 HF clinic appointment due to not feeling well.  He will reschedule when the infection has resolved.    Optivol thoracic impedance suggesting normal fluid levels.    Prescribed: Furosemide 20 mg Take '20mg'$  (1 tablet)by mouth daily alternating with '40mg'$  (2 tablets) every other day. Spironolactone 25 mg take 0.5 tablet (12.5 mg total) daily   Labs: 06/24/2022 Creatinine 1.40, BUN 25, Potassium 5.2, Sodium 137, GFR 51 04/22/2022 Creatinine 1.65, BUN 21, Potassium 5.5, Sodium 143, GFR 42 04/04/2022 Creatinine 1.38, BUN 23, Potassium 4.3, Sodium 139, GFR 52 03/10/2022 Creatinine 1.58, BUN 29, Potassium 5.6, Sodium 142, GFR 44 02/25/2022 Creatinine 1.41, BUN 19, Potassium 5.6, Sodium 141, GFR 50 A complete set of results can be found in Results Review.   Recommendations:  No changes and encouraged to call if experiencing any fluid symptoms.   Follow-up plan: ICM clinic phone appointment on 08/19/2022.   91 day device clinic remote transmission 09/11/2022.     EP/Cardiology Office Visits:  Canceled 07/10/2022 with Dr Aundra Dubin.  Recall 11/13/2022 with Dr Lovena Le.   Copy of ICM check sent to Dr. Lovena Le.     3 month ICM trend: 07/14/2022.    12-14 Month ICM trend:     Rosalene Billings, RN 07/17/2022 1:08 PM

## 2022-07-18 ENCOUNTER — Other Ambulatory Visit: Payer: Self-pay | Admitting: *Deleted

## 2022-07-18 NOTE — Patient Outreach (Signed)
[facility]) Care Management  07/18/2022  Wayne [name] 08-Feb-1941 [id number]    Care Management   Outreach Note  07/18/2022 Name: Wayne [name] MRN: [id number] DOB: February 15, 1941  Unsuccessful outreach #1  Follow Up Plan:  A HIPAA compliant phone message was left for the patient providing contact information and requesting a return call.  The care management team will reach out to the patient again over the next 7 days.   Raina Mina, RN Care Management Coordinator [place] Office [phone]

## 2022-08-03 ENCOUNTER — Other Ambulatory Visit (HOSPITAL_COMMUNITY): Payer: Self-pay | Admitting: Cardiology

## 2022-08-07 DIAGNOSIS — N35011 Post-traumatic bulbous urethral stricture: Secondary | ICD-10-CM | POA: Diagnosis not present

## 2022-08-07 DIAGNOSIS — R300 Dysuria: Secondary | ICD-10-CM | POA: Diagnosis not present

## 2022-08-19 ENCOUNTER — Telehealth: Payer: Self-pay

## 2022-08-19 ENCOUNTER — Ambulatory Visit (INDEPENDENT_AMBULATORY_CARE_PROVIDER_SITE_OTHER): Payer: Medicare Other

## 2022-08-19 DIAGNOSIS — I5022 Chronic systolic (congestive) heart failure: Secondary | ICD-10-CM

## 2022-08-19 DIAGNOSIS — Z95810 Presence of automatic (implantable) cardiac defibrillator: Secondary | ICD-10-CM | POA: Diagnosis not present

## 2022-08-19 NOTE — Progress Notes (Signed)
EPIC Encounter for ICM Monitoring  Patient Name: Wayne [name] is a 81 y.o. male Date: 08/19/2022 Primary Care Physican: [name], MD Primary Cardiologist: Aundra Dubin Electrophysiologist: Lovena Le 04/01/2022 Weight: 230 lbs  07/17/2022 Weight: 228 lbs         Attempted call to patient and unable to reach.  Left detailed message per DPR regarding transmission. Transmission reviewed.    Optivol thoracic impedance suggesting possible fluid accumulation starting 8/29.    Prescribed: Furosemide 20 mg Take '20mg'$  (1 tablet)by mouth daily alternating with '40mg'$  (2 tablets) every other day. Spironolactone 25 mg take 0.5 tablet (12.5 mg total) daily   Labs: 06/24/2022 Creatinine 1.40, BUN 25, Potassium 5.2, Sodium 137, GFR 51 04/22/2022 Creatinine 1.65, BUN 21, Potassium 5.5, Sodium 143, GFR 42 04/04/2022 Creatinine 1.38, BUN 23, Potassium 4.3, Sodium 139, GFR 52 03/10/2022 Creatinine 1.58, BUN 29, Potassium 5.6, Sodium 142, GFR 44 02/25/2022 Creatinine 1.41, BUN 19, Potassium 5.6, Sodium 141, GFR 50 A complete set of results can be found in Results Review.   Recommendations: Left voice mail with ICM number and encouraged to call if experiencing any fluid symptoms.   Follow-up plan: ICM clinic phone appointment on 08/22/2022 (manual) to recheck fluid levels.   91 day device clinic remote transmission 09/11/2022.     EP/Cardiology Office Visits:  Needs to reschedule canceled 07/10/2022 with Dr Aundra Dubin.  Recall 11/13/2022 with Dr Lovena Le.   Copy of ICM check sent to Dr. Lovena Le.   Will send to Dr Aundra Dubin for review if patient is reached.   3 month ICM trend: 08/19/2022.    12-14 Month ICM trend:     Rosalene Billings, RN 08/19/2022 9:01 AM

## 2022-08-19 NOTE — Telephone Encounter (Signed)
Remote ICM transmission received.  Attempted call to patient regarding ICM remote transmission and left detailed message per DPR.  Advised to return call for any fluid symptoms or questions. Next ICM remote transmission scheduled 08/22/2022.

## 2022-08-21 DIAGNOSIS — R3914 Feeling of incomplete bladder emptying: Secondary | ICD-10-CM | POA: Diagnosis not present

## 2022-08-22 NOTE — Progress Notes (Signed)
No ICM remote transmission received for 08/19/2022 and next ICM transmission scheduled for 09/01/2022.

## 2022-08-28 DIAGNOSIS — R3914 Feeling of incomplete bladder emptying: Secondary | ICD-10-CM | POA: Diagnosis not present

## 2022-09-01 ENCOUNTER — Ambulatory Visit (INDEPENDENT_AMBULATORY_CARE_PROVIDER_SITE_OTHER): Payer: Medicare Other

## 2022-09-01 DIAGNOSIS — I5022 Chronic systolic (congestive) heart failure: Secondary | ICD-10-CM

## 2022-09-01 DIAGNOSIS — Z95810 Presence of automatic (implantable) cardiac defibrillator: Secondary | ICD-10-CM

## 2022-09-02 DIAGNOSIS — R310 Gross hematuria: Secondary | ICD-10-CM | POA: Diagnosis not present

## 2022-09-02 DIAGNOSIS — Z8551 Personal history of malignant neoplasm of bladder: Secondary | ICD-10-CM | POA: Diagnosis not present

## 2022-09-02 DIAGNOSIS — N3289 Other specified disorders of bladder: Secondary | ICD-10-CM | POA: Diagnosis not present

## 2022-09-03 NOTE — Progress Notes (Signed)
EPIC Encounter for ICM Monitoring  Patient Name: Wayne [name] is a 81 y.o. male Date: 09/03/2022 Primary Care Physican: [name], MD Primary Cardiologist: Aundra Dubin Electrophysiologist: Lovena Le 04/01/2022 Weight: 230 lbs  07/17/2022 Weight: 228 lbs         Transmission reviewed.    Optivol thoracic impedance suggesting fluid levels returned to normal.    Prescribed: Furosemide 20 mg Take '20mg'$  (1 tablet)by mouth daily alternating with '40mg'$  (2 tablets) every other day. Spironolactone 25 mg take 0.5 tablet (12.5 mg total) daily   Labs: 06/24/2022 Creatinine 1.40, BUN 25, Potassium 5.2, Sodium 137, GFR 51 04/22/2022 Creatinine 1.65, BUN 21, Potassium 5.5, Sodium 143, GFR 42 04/04/2022 Creatinine 1.38, BUN 23, Potassium 4.3, Sodium 139, GFR 52 03/10/2022 Creatinine 1.58, BUN 29, Potassium 5.6, Sodium 142, GFR 44 02/25/2022 Creatinine 1.41, BUN 19, Potassium 5.6, Sodium 141, GFR 50 A complete set of results can be found in Results Review.   Recommendations:  No changes.    Follow-up plan: ICM clinic phone appointment on 10/06/2022.  91 day device clinic remote transmission 09/11/2022.     EP/Cardiology Office Visits:  Needs to reschedule canceled 07/10/2022 with Dr Aundra Dubin.  Recall 11/13/2022 with Dr Lovena Le.   Copy of ICM check sent to Dr. Lovena Le.  3 month ICM trend: 09/01/2022.    12-14 Month ICM trend:     Rosalene Billings, RN 09/03/2022 12:21 PM

## 2022-09-10 ENCOUNTER — Other Ambulatory Visit (HOSPITAL_COMMUNITY): Payer: Self-pay

## 2022-09-10 DIAGNOSIS — I5022 Chronic systolic (congestive) heart failure: Secondary | ICD-10-CM

## 2022-09-10 NOTE — Progress Notes (Signed)
Orders Placed This Encounter  Procedures   ECHOCARDIOGRAM COMPLETE    Standing Status:   Future    Standing Expiration Date:   09/11/2023    Order Specific Question:   Where should this test be performed    Answer:   [place]    Order Specific Question:   Perflutren DEFINITY (image enhancing agent) should be administered unless hypersensitivity or allergy exist    Answer:   Administer Perflutren    Order Specific Question:   Reason for exam-Echo    Answer:   Congestive Heart Failure  I50.9    Order Specific Question:   Release to patient    Answer:   Immediate

## 2022-09-11 ENCOUNTER — Ambulatory Visit (INDEPENDENT_AMBULATORY_CARE_PROVIDER_SITE_OTHER): Payer: Medicare Other

## 2022-09-11 DIAGNOSIS — I255 Ischemic cardiomyopathy: Secondary | ICD-10-CM

## 2022-09-12 LAB — CUP PACEART REMOTE DEVICE CHECK
Battery Remaining Longevity: 43 mo
Battery Voltage: 2.97 V
Brady Statistic RV Percent Paced: 0.02 %
Date Time Interrogation Session: 20230928134227
HighPow Impedance: 73 Ohm
Implantable Lead Implant Date: 20180612
Implantable Lead Location: 753860
Implantable Lead Model: 6935
Implantable Pulse Generator Implant Date: 20180103
Lead Channel Impedance Value: 342 Ohm
Lead Channel Impedance Value: 437 Ohm
Lead Channel Pacing Threshold Amplitude: 1 V
Lead Channel Pacing Threshold Pulse Width: 0.4 ms
Lead Channel Sensing Intrinsic Amplitude: 10.25 mV
Lead Channel Sensing Intrinsic Amplitude: 10.25 mV
Lead Channel Setting Pacing Amplitude: 2.5 V
Lead Channel Setting Pacing Pulse Width: 0.4 ms
Lead Channel Setting Sensing Sensitivity: 0.3 mV

## 2022-09-17 ENCOUNTER — Telehealth: Payer: Self-pay | Admitting: *Deleted

## 2022-09-17 ENCOUNTER — Encounter: Payer: Self-pay | Admitting: *Deleted

## 2022-09-17 NOTE — Progress Notes (Signed)
Remote ICD transmission.   

## 2022-09-17 NOTE — Patient Instructions (Signed)
Visit Information  Thank you for taking time to visit with me today. Please don't hesitate to contact me if I can be of assistance to you.   Following are the goals we discussed today:   Goals Addressed               This Visit's Progress     COMPLETED: No needs (pt-stated)        Care Coordination Interventions: Advised patient to scheduled his AWV for 2023 with his primary provider Reviewed medications with patient and discussed adherence to all medication along with any education needed Reviewed scheduled/upcoming provider appointments including pending appointments Screening for signs and symptoms of depression related to chronic disease state  Assessed social determinant of health barriers         Please call the care guide team at [phone] if you need to cancel or reschedule your appointment.   If you are experiencing a Mental Health or [place] or need someone to talk to, please call the Suicide and Crisis Lifeline: [phone]  Patient verbalizes understanding of instructions and care plan provided today and agrees to view in [place]. Active MyChart status and patient understanding of how to access instructions and care plan via MyChart confirmed with patient.     No further follow up required: No further needs  Raina Mina, RN Care Management Coordinator [place] Office [phone]

## 2022-09-17 NOTE — Patient Outreach (Signed)
  Care Coordination   Initial Visit Note   09/17/2022 Name: Wayne [name] MRN: [id number] DOB: 1941/04/21  Wayne [name] is a 81 y.o. year old male who sees Burchette, Alinda Sierras, MD for primary care. I spoke with  Kelton Pillar by phone today.  What matters to the patients health and wellness today?  No needs    Goals Addressed               This Visit's Progress     COMPLETED: No needs (pt-stated)        Care Coordination Interventions: Advised patient to scheduled his AWV for 2023 with his primary provider Reviewed medications with patient and discussed adherence to all medication along with any education needed Reviewed scheduled/upcoming provider appointments including pending appointments Screening for signs and symptoms of depression related to chronic disease state  Assessed social determinant of health barriers         SDOH assessments and interventions completed:  Yes  SDOH Interventions Today    Flowsheet Row Most Recent Value  SDOH Interventions   Food Insecurity Interventions Intervention Not Indicated  Housing Interventions Intervention Not Indicated  Transportation Interventions Intervention Not Indicated        Care Coordination Interventions Activated:  Yes  Care Coordination Interventions:  Yes, provided   Follow up plan: No further intervention required.   Encounter Outcome:  Pt. Visit Completed   Raina Mina, RN Care Management Coordinator [place] Office [phone]

## 2022-10-06 ENCOUNTER — Ambulatory Visit (INDEPENDENT_AMBULATORY_CARE_PROVIDER_SITE_OTHER): Payer: Medicare Other

## 2022-10-06 DIAGNOSIS — I5022 Chronic systolic (congestive) heart failure: Secondary | ICD-10-CM | POA: Diagnosis not present

## 2022-10-06 DIAGNOSIS — Z95810 Presence of automatic (implantable) cardiac defibrillator: Secondary | ICD-10-CM | POA: Diagnosis not present

## 2022-10-07 ENCOUNTER — Emergency Department (HOSPITAL_COMMUNITY)
Admission: EM | Admit: 2022-10-07 | Discharge: 2022-10-07 | Disposition: A | Discharge status: Home / Self Care | Payer: Medicare Other | Attending: Emergency Medicine | Admitting: Emergency Medicine

## 2022-10-07 ENCOUNTER — Encounter (HOSPITAL_COMMUNITY): Payer: Self-pay

## 2022-10-07 ENCOUNTER — Emergency Department (HOSPITAL_COMMUNITY): Payer: Medicare Other

## 2022-10-07 DIAGNOSIS — R360 Urethral discharge without blood: Secondary | ICD-10-CM | POA: Insufficient documentation

## 2022-10-07 DIAGNOSIS — I509 Heart failure, unspecified: Secondary | ICD-10-CM | POA: Diagnosis not present

## 2022-10-07 DIAGNOSIS — I2510 Atherosclerotic heart disease of native coronary artery without angina pectoris: Secondary | ICD-10-CM | POA: Insufficient documentation

## 2022-10-07 DIAGNOSIS — E871 Hypo-osmolality and hyponatremia: Secondary | ICD-10-CM | POA: Diagnosis not present

## 2022-10-07 DIAGNOSIS — Z8546 Personal history of malignant neoplasm of prostate: Secondary | ICD-10-CM | POA: Diagnosis not present

## 2022-10-07 DIAGNOSIS — I700 Atherosclerosis of aorta: Secondary | ICD-10-CM | POA: Diagnosis not present

## 2022-10-07 DIAGNOSIS — Z8551 Personal history of malignant neoplasm of bladder: Secondary | ICD-10-CM | POA: Diagnosis not present

## 2022-10-07 DIAGNOSIS — N390 Urinary tract infection, site not specified: Secondary | ICD-10-CM

## 2022-10-07 DIAGNOSIS — N3090 Cystitis, unspecified without hematuria: Secondary | ICD-10-CM | POA: Diagnosis not present

## 2022-10-07 DIAGNOSIS — K8020 Calculus of gallbladder without cholecystitis without obstruction: Secondary | ICD-10-CM | POA: Diagnosis not present

## 2022-10-07 DIAGNOSIS — K4020 Bilateral inguinal hernia, without obstruction or gangrene, not specified as recurrent: Secondary | ICD-10-CM | POA: Diagnosis not present

## 2022-10-07 DIAGNOSIS — R369 Urethral discharge, unspecified: Secondary | ICD-10-CM

## 2022-10-07 DIAGNOSIS — Z7901 Long term (current) use of anticoagulants: Secondary | ICD-10-CM | POA: Diagnosis not present

## 2022-10-07 LAB — URINALYSIS, ROUTINE W REFLEX MICROSCOPIC
Bilirubin Urine: NEGATIVE
Glucose, UA: >=500 mg/dL — AB
Ketones, ur: NEGATIVE mg/dL
Nitrite: NEGATIVE
Protein, ur: 30 mg/dL — AB
Specific Gravity, Urine: 1.011 (ref 1.005–1.030)
WBC, UA: >50 WBC/hpf — ABNORMAL HIGH (ref 0–5)
pH: 6 (ref 5.0–8.0)

## 2022-10-07 LAB — CBC WITH DIFFERENTIAL/PLATELET
Abs Immature Granulocytes: 0.06 10*3/uL (ref 0.00–0.07)
Basophils Absolute: 0.1 10*3/uL (ref 0.0–0.1)
Basophils Relative: 1 %
Eosinophils Absolute: 0.3 10*3/uL (ref 0.0–0.5)
Eosinophils Relative: 3 %
HCT: 42.5 % (ref 39.0–52.0)
Hemoglobin: 13.9 g/dL (ref 13.0–17.0)
Immature Granulocytes: 1 %
Lymphocytes Relative: 25 %
Lymphs Abs: 2.3 10*3/uL (ref 0.7–4.0)
MCH: 28.7 pg (ref 26.0–34.0)
MCHC: 32.7 g/dL (ref 30.0–36.0)
MCV: 87.6 fL (ref 80.0–100.0)
Monocytes Absolute: 0.6 10*3/uL (ref 0.1–1.0)
Monocytes Relative: 6 %
Neutro Abs: 6.2 10*3/uL (ref 1.7–7.7)
Neutrophils Relative %: 64 %
Platelets: 221 10*3/uL (ref 150–400)
RBC: 4.85 MIL/uL (ref 4.22–5.81)
RDW: 12.5 % (ref 11.5–15.5)
WBC: 9.6 10*3/uL (ref 4.0–10.5)
nRBC: 0 % (ref 0.0–0.2)

## 2022-10-07 LAB — COMPREHENSIVE METABOLIC PANEL
ALT: 18 U/L (ref 0–44)
AST: 22 U/L (ref 15–41)
Albumin: 2.9 g/dL — ABNORMAL LOW (ref 3.5–5.0)
Alkaline Phosphatase: 98 U/L (ref 38–126)
Anion gap: 9 (ref 5–15)
BUN: 13 mg/dL (ref 8–23)
CO2: 23 mmol/L (ref 22–32)
Calcium: 9.1 mg/dL (ref 8.9–10.3)
Chloride: 101 mmol/L (ref 98–111)
Creatinine, Ser: 1.51 mg/dL — ABNORMAL HIGH (ref 0.61–1.24)
GFR, Estimated: 47 mL/min — ABNORMAL LOW (ref >60)
Glucose, Bld: 319 mg/dL — ABNORMAL HIGH (ref 70–99)
Potassium: 3.9 mmol/L (ref 3.5–5.1)
Sodium: 133 mmol/L — ABNORMAL LOW (ref 135–145)
Total Bilirubin: 0.9 mg/dL (ref 0.3–1.2)
Total Protein: 6.8 g/dL (ref 6.5–8.1)

## 2022-10-07 LAB — LACTIC ACID, PLASMA: Lactic Acid, Venous: 1.6 mmol/L (ref 0.5–1.9)

## 2022-10-07 MED ORDER — DOXYCYCLINE HYCLATE 100 MG PO CAPS: 100.0000 mg | ORAL_CAPSULE | Freq: Two times a day (BID) | ORAL | 0 refills | Status: DC

## 2022-10-07 MED ORDER — SODIUM CHLORIDE 0.9 % IV BOLUS (SEPSIS)
500.0000 mL | Freq: Once | INTRAVENOUS | Status: AC
  Administered 2022-10-07: 500 mL via INTRAVENOUS

## 2022-10-07 MED ORDER — ONDANSETRON HCL 4 MG/2ML IJ SOLN
4.0000 mg | Freq: Once | INTRAMUSCULAR | Status: DC
  Filled 2022-10-07: qty 2

## 2022-10-07 MED ORDER — BACITRACIN ZINC 500 UNIT/GM EX OINT
TOPICAL_OINTMENT | Freq: Two times a day (BID) | CUTANEOUS | Status: DC
  Administered 2022-10-07: 1 via TOPICAL
  Filled 2022-10-07: qty 0.9

## 2022-10-07 MED ORDER — OXYCODONE HCL 5 MG PO TABS
5.0000 mg | ORAL_TABLET | Freq: Once | ORAL | Status: AC
  Administered 2022-10-07: 5 mg via ORAL
  Filled 2022-10-07: qty 1

## 2022-10-07 MED ORDER — DOXYCYCLINE HYCLATE 100 MG PO TABS: 100.0000 mg | ORAL_TABLET | Freq: Once | ORAL | Status: DC

## 2022-10-07 MED ORDER — SODIUM CHLORIDE 0.9 % IV SOLN
100.0000 mg | Freq: Once | INTRAVENOUS | Status: AC
  Administered 2022-10-07: 100 mg via INTRAVENOUS
  Filled 2022-10-07: qty 100

## 2022-10-07 MED ORDER — BACITRACIN ZINC 500 UNIT/GM EX OINT: 1.0000 | TOPICAL_OINTMENT | Freq: Two times a day (BID) | CUTANEOUS | 0 refills | Status: DC

## 2022-10-07 MED ORDER — LEVOFLOXACIN IN D5W 500 MG/100ML IV SOLN
500.0000 mg | Freq: Once | INTRAVENOUS | Status: AC
  Administered 2022-10-07: 500 mg via INTRAVENOUS
  Filled 2022-10-07: qty 100

## 2022-10-07 MED ORDER — SODIUM CHLORIDE 0.9 % IV SOLN: 1.0000 g | Freq: Once | INTRAVENOUS | Status: DC

## 2022-10-07 NOTE — ED Notes (Signed)
Brief placed on pt. Wheelchair used for departure. Pt verbalizes understanding of dc instructions and says he will make sure he goes to urologist appointmend tomorrow. Medication and prescriptions reviewed.

## 2022-10-07 NOTE — ED Triage Notes (Signed)
Pt reports hx of prostate and bladder cancer, had prostate surgery in July and has been having on and off infections since. Last week he had a foley catheter placed by Urology. Today he presents with copious amounts of purulent drainage from his penis at the catheter insertion site.

## 2022-10-07 NOTE — ED Provider Triage Note (Signed)
Emergency Medicine Provider Triage Evaluation Note  Wayne [name] , a 81 y.o. male  was evaluated in triage.  Pt complains of scrotal infection.  Patient states he has been having infections on and off since July when he had prostate surgery.  History of prostate and bladder cancer.  Patient admits to urinary retention 1 week ago and had a catheter placed.  He admits to purulent drainage from penis around catheter site.  He admits to severe scrotal pain.  No fever or chills.  Review of Systems  Positive: Scrotal pain Negative: fever  Physical Exam  BP 102/64 (BP Location: Left Arm)   Pulse 67   Temp 97.9 F (36.6 C)   Resp 17   SpO2 91%  Gen:   Awake, no distress   Resp:  Normal effort  MSK:   Moves extremities without difficulty  Other:  Severe tenderness to scrotum with purulent drainage  Medical Decision Making  Medically screening exam initiated at 4:26 PM.  Appropriate orders placed.  [name] was informed that the remainder of the evaluation will be completed by another provider, this initial triage assessment does not replace that evaluation, and the importance of remaining in the ED until their evaluation is complete.  CT pelvis ordered Labs Blood cultures/lactic acid   Suzy Bouchard, PA-C 10/07/22 1630

## 2022-10-07 NOTE — ED Provider Notes (Signed)
[facility] EMERGENCY DEPARTMENT Provider Note   CSN: [id number] Arrival date & time: 10/07/22  1329     History  Chief Complaint  Patient presents with   Penile Discharge    Wayne [name] is a 81 y.o. male. With pmh refractory urethral stricture s/p dilation and 16 French Foley catheter placement July 2023, bladder and prostate cancer, CAD, CHF, pAFib on Eliquis who presents with penile discharge.  [facility] and notes that he has had recurrent infections since his previous urethral dilation in July of this year.  He just finished 5 days of Levaquin and restarted a new course of Levaquin today but came here because he has not had any improvement.  He said last week he was in the office with his urologist who replaced his Foley catheter but since then he has been having continued purulent discharge from his penis.  He is started a new course of Levaquin today but came here because it has not resolved.  He feels like he is fully emptying his bladder.  He has had no blood clots or pus within the Foley bag.  He has had no fevers, no nausea, no vomiting, no back pain.  He has been having mild suprapubic pain.    Penile Discharge       Home Medications Prior to Admission medications   Medication Sig Start Date End Date Taking? Authorizing Provider  bacitracin ointment Apply 1 Application topically 2 (two) times daily. Apply to penis tip 10/07/22  Yes Elgie Congo, MD  doxycycline (VIBRAMYCIN) 100 MG capsule Take 1 capsule (100 mg total) by mouth 2 (two) times daily. 10/07/22  Yes Elgie Congo, MD  carvedilol (COREG) 12.5 MG tablet Take 1 tablet (12.5 mg total) by mouth 2 (two) times daily with a meal. 08/16/21   Clegg, Amy D, NP  dapagliflozin propanediol (FARXIGA) 10 MG TABS tablet Take 1 tablet (10 mg total) by mouth daily before breakfast. Patient not taking: Reported on 06/20/2022 04/04/22   Rafael Bihari, FNP   diphenhydramine-acetaminophen (TYLENOL PM) 25-500 MG TABS tablet Take 1 tablet at bedtime as needed by mouth (for sleep).     [provider]  ELIQUIS 5 MG TABS tablet TAKE 1 TABLET BY MOUTH TWICE A DAY 08/04/22   Larey Dresser, MD  furosemide (LASIX) 20 MG tablet Take '20mg'$  (1 tablet)by mouth daily alternating with '40mg'$  (2 tablets) every other day. Patient taking differently: Take 20 mg by mouth every evening. 04/04/22   [place], Maricela Bo, FNP  glucose blood (ACCU-CHEK AVIVA PLUS) test strip USE TO CHECK BLOOD SUGAR ONCE TO TWICE DAILY 12/02/19   Burchette, Alinda Sierras, MD  losartan (COZAAR) 25 MG tablet Take 1 tablet (25 mg total) by mouth in the morning and at bedtime. 05/29/22   Larey Dresser, MD  methimazole (TAPAZOLE) 10 MG tablet Take 1 tablet (10 mg total) by mouth daily. Patient not taking: Reported on 06/20/2022 04/04/22   Rafael Bihari, FNP  mexiletine (MEXITIL) 150 MG capsule Take 1 capsule (150 mg total) by mouth 2 (two) times daily. Patient not taking: Reported on 06/20/2022 04/04/22   Rafael Bihari, FNP  Multiple Vitamin (MULTIVITAMIN WITH MINERALS) TABS tablet Take 1 tablet by mouth in the morning. Centrum Silver Adult 50+    [provider]  nitrofurantoin, macrocrystal-monohydrate, (MACROBID) 100 MG capsule Take 1 capsule (100 mg total) by mouth at bedtime. 07/01/22   Festus Aloe, MD  Omega-3  Fatty Acids (FISH OIL) 1000 MG CAPS Take 1,000 mg by mouth 2 (two) times daily.     [provider]  rosuvastatin (CRESTOR) 20 MG tablet Take 20 mg by mouth in the morning. 05/15/22   [provider]  rosuvastatin (CRESTOR) 40 MG tablet Take 1 tablet (40 mg total) by mouth daily. Patient not taking: Reported on 06/20/2022 09/19/21   Larey Dresser, MD  rosuvastatin (CRESTOR) 40 MG tablet Take 1 tablet (40 mg total) by mouth daily. 06/19/22   Larey Dresser, MD  sodium zirconium cyclosilicate (LOKELMA) 5 g packet Take 5 g by mouth daily. Patient not  taking: Reported on 06/20/2022 04/22/22   Rafael Bihari, FNP  spironolactone (ALDACTONE) 25 MG tablet Take 0.5 tablets (12.5 mg total) by mouth daily. Patient taking differently: Take 25 mg by mouth in the morning. 04/22/22   [place]ford, Maricela Bo, FNP  tamsulosin (FLOMAX) 0.4 MG CAPS capsule Take 0.4 mg by mouth at bedtime. 02/17/19   [provider]  TRUEplus Lancets 30G MISC USE TO CHECK BLOOD SUGAR EVERY DAY TO TWICE DAILY 03/15/20   Burchette, Alinda Sierras, MD      Allergies    Delene Loll [sacubitril-valsartan], Crestor [rosuvastatin calcium], Lipitor [atorvastatin], Plavix [clopidogrel], and Ancef [cefazolin]    Review of Systems   Review of Systems  Genitourinary:  Positive for penile discharge.    Physical Exam Updated Vital Signs BP 115/65 (BP Location: Left Arm)   Pulse 70   Temp (!) 97.3 F (36.3 C) (Oral)   Resp 17   SpO2 96%  Physical Exam Constitutional: Alert and oriented. Well appearing and in no distress. Eyes: Conjunctivae are normal. ENT      Mouth/Throat: Mucous membranes are moist.      Neck: No stridor. Cardiovascular: S1, S2,  Normal and symmetric distal pulses are present in all extremities.Warm and well perfused. Respiratory: Normal respiratory effort.  O2 sat 96 on RA Gastrointestinal: Soft and nondistended with mild suprapubic tenderness, no CVA tenderness, no rebound, no guarding. GU: There is purulent discharge around the penile urethra and purulent discharge within diaper as can be seen in media.  There is no erythema warmth or skin changes or crepitus to the penis, scrotum or testicles.  The Foley bag is full of clear yellow urine. Musculoskeletal:       Right lower leg: No tenderness or edema.      Left lower leg: No tenderness or edema. Neurologic: Normal speech and language. No gross focal neurologic deficits are appreciated. Skin: Skin is warm, dry and intact. No rash noted. Psychiatric: Mood and affect are normal. Speech and behavior are  normal.   ED Results / Procedures / Treatments   Labs (all labs ordered are listed, but only abnormal results are displayed) Labs Reviewed  COMPREHENSIVE METABOLIC PANEL - Abnormal; Notable for the following components:      Result Value   Sodium 133 (*)    Glucose, Bld 319 (*)    Creatinine, Ser 1.51 (*)    Albumin 2.9 (*)    GFR, Estimated 47 (*)    All other components within normal limits  URINALYSIS, ROUTINE W REFLEX MICROSCOPIC - Abnormal; Notable for the following components:   APPearance HAZY (*)    Glucose, UA >=500 (*)    Hgb urine dipstick MODERATE (*)    Protein, ur 30 (*)    Leukocytes,Ua LARGE (*)    WBC, UA >50 (*)    Bacteria, UA FEW (*)  All other components within normal limits  CULTURE, BLOOD (ROUTINE X 2)  CULTURE, BLOOD (ROUTINE X 2)  CBC WITH DIFFERENTIAL/PLATELET  LACTIC ACID, PLASMA    EKG None  Radiology CT Renal Stone Study  Result Date: 10/07/2022 CLINICAL DATA:  History of bladder and prostate cancer presenting with purulence discharge from the penis and around an existing catheter site. EXAM: CT ABDOMEN AND PELVIS WITHOUT CONTRAST TECHNIQUE: Multidetector CT imaging of the abdomen and pelvis was performed following the standard protocol without IV contrast. RADIATION DOSE REDUCTION: This exam was performed according to the departmental dose-optimization program which includes automated exposure control, adjustment of the mA and/or kV according to patient size and/or use of iterative reconstruction technique. COMPARISON:  September 02, 2022 FINDINGS: Lower chest: A small area of focal consolidation is seen within the posterior aspect of the left lung base. Mild posterior right basilar atelectasis is also seen. Hepatobiliary: No focal liver abnormality is seen. Tiny gallstones are seen within the lumen of a mildly distended gallbladder. There is no evidence of gallbladder wall thickening, pericholecystic inflammation or biliary dilatation. Pancreas:  Unremarkable. No pancreatic ductal dilatation or surrounding inflammatory changes. Spleen: Normal in size without focal abnormality. Adrenals/Urinary Tract: Adrenal glands are unremarkable. Kidneys are normal in size, without renal calculi or hydronephrosis. A stable 2.5 cm diameter cyst is seen along the posterolateral aspect of the lower pole of the right kidney. A Foley catheter is seen within an empty urinary bladder. Marked severity diffuse urinary bladder wall thickening is seen, slightly more prominent along the posterior aspect of the bladder. Moderate severity surrounding inflammatory fat stranding is noted. Stomach/Bowel: Stomach is within normal limits. Appendix appears normal. Stool is seen throughout the large bowel. No evidence of bowel wall thickening, distention, or inflammatory changes. Vascular/Lymphatic: Aortic atherosclerosis. A 17 mm left pelvic obturator node is seen (measures 13 mm on the prior study). Multiple perirectal lymph nodes are also seen which are increased in size. The largest is seen along the anterolateral aspect of the perirectal region on the right and measures 19 mm (axial CT image 82, CT series 4). This measured 13 mm on the prior study. Reproductive: Multiple radiation implantation seeds are seen within an enlarged prostate gland. Mass effect from the enlarged prostate gland is seen along the posterior aspect of the urinary bladder. Enlarged seminal vesicles are also noted. Other: A 4.7 cm x 2.9 cm fat containing right inguinal hernia is seen. An additional 3.6 cm x 1.9 cm fat containing left inguinal hernia is noted. Multiple surgical coils are seen along the medial aspect of the right inguinal region. No abdominopelvic ascites. Musculoskeletal: Multilevel degenerative changes are seen throughout the lumbar spine. IMPRESSION: 1. Findings consistent with marked severity cystitis. Given the patient's history of bladder and prostate cancer, and underlying neoplastic process  cannot be excluded. 2. Cholelithiasis. 3. Small area of left basilar consolidation which may represent a small area of atelectasis and/or infiltrate. 4. Increased pelvic adenopathy, as described above. 5. Bilateral fat containing inguinal hernias. 6. Aortic atherosclerosis. Aortic Atherosclerosis (ICD10-I70.0). Electronically Signed   By: Virgina Norfolk M.D.   On: 10/07/2022 21:50    Procedures Procedures    Medications Ordered in ED Medications  ondansetron (ZOFRAN) injection 4 mg (4 mg Intravenous Patient Refused/Not Given 10/07/22 2017)  bacitracin ointment (has no administration in time range)  doxycycline (VIBRAMYCIN) 100 mg in sodium chloride 0.9 % 250 mL IVPB (100 mg Intravenous New Bag/Given 10/07/22 2017)  levofloxacin (LEVAQUIN) IVPB 500 mg (has no  administration in time range)  sodium chloride 0.9 % bolus 500 mL (0 mLs Intravenous Stopped 10/07/22 2122)  oxyCODONE (Oxy IR/ROXICODONE) immediate release tablet 5 mg (5 mg Oral Given 10/07/22 2017)    ED Course/ Medical Decision Making/ A&P Clinical Course as of 10/07/22 2208  Tue Oct 07, 2022  1914 Spoke with Dr. Gloriann Loan of urology who reviewed patient's media image and we discussed his case, he recommends adding on doxycycline for MRSA and skin flora coverage as well as bacitracin ointment and have him follow-up tomorrow at 845 as planned as they plan to do a void trial and likely plan to pull his catheter. [VB]    Clinical Course User Index [VB] Elgie Congo, MD                           Medical Decision Making DAMICO PARTIN is a 81 y.o. male. With pmh refractory urethral stricture s/p dilation and 16 French Foley catheter placement July 2023, bladder and prostate cancer, CAD, CHF, pAFib on Eliquis who presents with penile discharge.   Based on the patient's history of recent Foley catheter exchange and presence of penile discharge from the urethra, suspect likely localized skin infection or urethritis.  There is no pus  within his Foley bag and there is clear yellow urine within his Foley bag.  Bedside bladder scan showed no urinary retention and empty bladder.  No concern for clots or blockage of the bladder.  I am not concerned for sepsis as he has no hypotension, no tachycardia, no increased work of breathing and a normal white blood cell count 9.6 with normal lactate 1.6.  His creatinine is 1.51 at baseline.  Mild hyponatremia 133.  UA was consistent with UTI with moderate hemoglobin large leukocyte esterase greater than 50 WBCs and few bacteria.  CT renal stone study showed evidence of cystitis, no renal stones, no evidence of pyelonephritis.  Incidental finding of gallstones which I told patient about, he is asymptomatic with no transaminitis.  Discussed case with Dr. Gloriann Loan who is on-call urologist for [facility] where patient is managed.  Plan to add on doxycycline to antibiotic course which she is currently taking Levaquin as well as topical bacitracin ointment. Dose of levaquin also ordered in ED. He will continue oral doxycycline and bacitracin ointment and follow-up tomorrow morning 8:45 in office with his urologist for likely Foley catheter removal.  Amount and/or Complexity of Data Reviewed Radiology: ordered.  Risk OTC drugs. Prescription drug management.    Final Clinical Impression(s) / ED Diagnoses Final diagnoses:  Penile discharge  Urinary tract infection without hematuria, site unspecified  Calculus of gallbladder without cholecystitis without obstruction    Rx / DC Orders ED Discharge Orders          Ordered    bacitracin ointment  2 times daily        10/07/22 2156    doxycycline (VIBRAMYCIN) 100 MG capsule  2 times daily        10/07/22 2156              Elgie Congo, MD 10/07/22 2208

## 2022-10-07 NOTE — Discharge Instructions (Addendum)
You were seen in the ER today for penile discharge likely due to a urinary tract infection with  urethritis.   See your urologist as planned tomorrow morning as they will likely be removing her Foley catheter.  Continue to take the new antibiotic prescribed  (doxycycline) with your current antibiotic levofloxacin starting tomorrow as you got doses here in the ER.  Apply the bacitracin antibiotic to your urethra or tip of your penis.  Come back if any severe high fevers, fainting episodes, severe abdominal pain or blockage of your catheter, or any other symptoms concerning to you.

## 2022-10-07 NOTE — ED Notes (Signed)
RN bladder scanned pt twice, 0ML twice. MD notified

## 2022-10-07 NOTE — Progress Notes (Signed)
EPIC Encounter for ICM Monitoring  Patient Name: Wayne [name] is a 81 y.o. male Date: 10/07/2022 Primary Care Physican: [name], MD Primary Cardiologist: Aundra Dubin Electrophysiologist: Lovena Le 04/01/2022 Weight: 230 lbs  07/17/2022 Weight: 228 lbs         Transmission reviewed.   Optivol thoracic impedance normal but was suggesting intermittent days with possible fluid accumulation within the last month.    Prescribed: Furosemide 20 mg Take '20mg'$  (1 tablet)by mouth daily alternating with '40mg'$  (2 tablets) every other day. Spironolactone 25 mg take 0.5 tablet (12.5 mg total) daily   Labs: 06/24/2022 Creatinine 1.40, BUN 25, Potassium 5.2, Sodium 137, GFR 51 04/22/2022 Creatinine 1.65, BUN 21, Potassium 5.5, Sodium 143, GFR 42 04/04/2022 Creatinine 1.38, BUN 23, Potassium 4.3, Sodium 139, GFR 52 03/10/2022 Creatinine 1.58, BUN 29, Potassium 5.6, Sodium 142, GFR 44 02/25/2022 Creatinine 1.41, BUN 19, Potassium 5.6, Sodium 141, GFR 50 A complete set of results can be found in Results Review.   Recommendations:  No changes.   Follow-up plan: ICM clinic phone appointment on 11/10/2022.  91 day device clinic remote transmission 12/11/2022.     EP/Cardiology Office Visits:  11/27/2022 with Dr Aundra Dubin.  Recall 11/13/2022 with Dr Lovena Le.   Copy of ICM check sent to Dr. Lovena Le.  3 month ICM trend: 10/06/2022.    12-14 Month ICM trend:     Rosalene Billings, RN 10/07/2022 8:25 AM

## 2022-10-07 NOTE — ED Notes (Signed)
RN called CT  

## 2022-10-12 LAB — CULTURE, BLOOD (ROUTINE X 2)
Culture: NO GROWTH
Culture: NO GROWTH
Special Requests: ADEQUATE
Special Requests: ADEQUATE

## 2022-10-14 ENCOUNTER — Telehealth: Payer: Self-pay | Admitting: *Deleted

## 2022-10-14 NOTE — Telephone Encounter (Signed)
     Patient  visit on 10/06/2022  at [facility] ed  was for UTI  Have you been able to follow up with your primary care physician?Patient good now  The patient was  able to obtain any needed medicine or equipment.  Are there diet recommendations that you are having difficulty following?  Patient expresses understanding of discharge instructions and education provided has no other needs at this time.    [place] [phone] , [place] [address] Email : [email or web address]

## 2022-10-17 DIAGNOSIS — R338 Other retention of urine: Secondary | ICD-10-CM | POA: Diagnosis not present

## 2022-10-17 DIAGNOSIS — N35012 Post-traumatic membranous urethral stricture: Secondary | ICD-10-CM | POA: Diagnosis not present

## 2022-10-27 DIAGNOSIS — R338 Other retention of urine: Secondary | ICD-10-CM | POA: Diagnosis not present

## 2022-10-27 DIAGNOSIS — N35012 Post-traumatic membranous urethral stricture: Secondary | ICD-10-CM | POA: Diagnosis not present

## 2022-10-30 ENCOUNTER — Encounter: Payer: Self-pay | Admitting: Nurse Practitioner

## 2022-10-30 ENCOUNTER — Other Ambulatory Visit (HOSPITAL_COMMUNITY): Payer: Self-pay | Admitting: Urology

## 2022-10-30 DIAGNOSIS — C778 Secondary and unspecified malignant neoplasm of lymph nodes of multiple regions: Secondary | ICD-10-CM

## 2022-10-30 DIAGNOSIS — C679 Malignant neoplasm of bladder, unspecified: Secondary | ICD-10-CM

## 2022-11-05 ENCOUNTER — Other Ambulatory Visit (HOSPITAL_COMMUNITY): Payer: Self-pay | Admitting: Adult Health

## 2022-11-05 DIAGNOSIS — I5022 Chronic systolic (congestive) heart failure: Secondary | ICD-10-CM

## 2022-11-10 ENCOUNTER — Ambulatory Visit (INDEPENDENT_AMBULATORY_CARE_PROVIDER_SITE_OTHER): Payer: Medicare Other

## 2022-11-10 DIAGNOSIS — I5022 Chronic systolic (congestive) heart failure: Secondary | ICD-10-CM | POA: Diagnosis not present

## 2022-11-10 DIAGNOSIS — Z95810 Presence of automatic (implantable) cardiac defibrillator: Secondary | ICD-10-CM | POA: Diagnosis not present

## 2022-11-11 DIAGNOSIS — R310 Gross hematuria: Secondary | ICD-10-CM | POA: Diagnosis not present

## 2022-11-12 ENCOUNTER — Ambulatory Visit
Admission: RE | Admit: 2022-11-12 | Discharge: 2022-11-12 | Disposition: A | Discharge status: Home / Self Care | Payer: Medicare Other | Source: Ambulatory Visit | Attending: Urology | Admitting: Urology

## 2022-11-12 DIAGNOSIS — Z8551 Personal history of malignant neoplasm of bladder: Secondary | ICD-10-CM | POA: Diagnosis not present

## 2022-11-12 DIAGNOSIS — Z8546 Personal history of malignant neoplasm of prostate: Secondary | ICD-10-CM | POA: Diagnosis not present

## 2022-11-12 DIAGNOSIS — K6289 Other specified diseases of anus and rectum: Secondary | ICD-10-CM | POA: Insufficient documentation

## 2022-11-12 DIAGNOSIS — I7789 Other specified disorders of arteries and arterioles: Secondary | ICD-10-CM | POA: Diagnosis not present

## 2022-11-12 DIAGNOSIS — C679 Malignant neoplasm of bladder, unspecified: Secondary | ICD-10-CM

## 2022-11-12 DIAGNOSIS — I700 Atherosclerosis of aorta: Secondary | ICD-10-CM | POA: Diagnosis not present

## 2022-11-12 DIAGNOSIS — K8020 Calculus of gallbladder without cholecystitis without obstruction: Secondary | ICD-10-CM | POA: Diagnosis not present

## 2022-11-12 DIAGNOSIS — I2510 Atherosclerotic heart disease of native coronary artery without angina pectoris: Secondary | ICD-10-CM | POA: Diagnosis not present

## 2022-11-12 DIAGNOSIS — R590 Localized enlarged lymph nodes: Secondary | ICD-10-CM | POA: Insufficient documentation

## 2022-11-12 DIAGNOSIS — C778 Secondary and unspecified malignant neoplasm of lymph nodes of multiple regions: Secondary | ICD-10-CM | POA: Diagnosis not present

## 2022-11-12 LAB — GLUCOSE, CAPILLARY: Glucose-Capillary: 255 mg/dL — ABNORMAL HIGH (ref 70–99)

## 2022-11-12 MED ORDER — FLUDEOXYGLUCOSE F - 18 (FDG) INJECTION
11.6000 | Freq: Once | INTRAVENOUS | Status: AC | PRN
  Administered 2022-11-12: 12.66 via INTRAVENOUS

## 2022-11-13 ENCOUNTER — Telehealth: Payer: Self-pay

## 2022-11-13 NOTE — Telephone Encounter (Signed)
LMOVM for patient to send missed ICM transmission. 

## 2022-11-14 NOTE — Telephone Encounter (Signed)
Transmission received.  Outreach made to Pt to advise transmission received and will forward to [employer].  Per Pt monitor accidentally became unplugged.

## 2022-11-14 NOTE — Telephone Encounter (Signed)
Patient returned CMA's call.  Patient stated he adjusted his machine and the readings should be transmitting again.  Patient would like a call back to confirm readings are being received.

## 2022-11-14 NOTE — Progress Notes (Signed)
EPIC Encounter for ICM Monitoring  Patient Name: Wayne [name] is a 81 y.o. male Date: 11/14/2022 Primary Care Physican: [name], MD Primary Cardiologist: Aundra Dubin Electrophysiologist: Lovena Le 04/01/2022 Weight: 230 lbs  07/17/2022 Weight: 228 lbs 11/14/2022 Weight: 225 lbs         Spoke with patient and heart failure questions reviewed.  Transmission results reviewed.  Pt asymptomatic for fluid accumulation.  Reports feeling well at this time and voices no complaints.     Optivol thoracic impedance normal but was suggesting possible fluid accumulation from 11/11-11/25.   Prescribed: Furosemide 20 mg Take '20mg'$  (1 tablet)by mouth daily alternating with '40mg'$  (2 tablets) every other day. Spironolactone 25 mg take 0.5 tablet (12.5 mg total) daily   Labs: 10/07/2022 Creatinine 1.51, BUN 13, Potassium 3.9, Sodium 133, GFR 47 06/24/2022 Creatinine 1.40, BUN 25, Potassium 5.2, Sodium 137, GFR 51 04/22/2022 Creatinine 1.65, BUN 21, Potassium 5.5, Sodium 143, GFR 42 04/04/2022 Creatinine 1.38, BUN 23, Potassium 4.3, Sodium 139, GFR 52 03/10/2022 Creatinine 1.58, BUN 29, Potassium 5.6, Sodium 142, GFR 44 02/25/2022 Creatinine 1.41, BUN 19, Potassium 5.6, Sodium 141, GFR 50 A complete set of results can be found in Results Review.   Recommendations:  No changes and encouraged to call if experiencing any fluid symptoms.   Follow-up plan: ICM clinic phone appointment on 12/22/2022.  91 day device clinic remote transmission 12/11/2022.     EP/Cardiology Office Visits:  11/27/2022 with Dr Aundra Dubin.  Recall 11/13/2022 with Dr Lovena Le.   Copy of ICM check sent to Dr. Lovena Le.  3 month ICM trend: 11/14/2022.    12-14 Month ICM trend:     Rosalene Billings, RN 11/14/2022 3:26 PM

## 2022-11-16 ENCOUNTER — Encounter (HOSPITAL_COMMUNITY): Payer: Self-pay

## 2022-11-16 ENCOUNTER — Observation Stay (HOSPITAL_BASED_OUTPATIENT_CLINIC_OR_DEPARTMENT_OTHER): Payer: Medicare Other | Admitting: Anesthesiology

## 2022-11-16 ENCOUNTER — Other Ambulatory Visit: Payer: Self-pay

## 2022-11-16 ENCOUNTER — Inpatient Hospital Stay (HOSPITAL_COMMUNITY)
Admission: EM | Admit: 2022-11-16 | Discharge: 2022-12-08 | DRG: 820 | Disposition: A | Discharge status: Hospice | Payer: Medicare Other | Attending: Family Medicine | Admitting: Family Medicine

## 2022-11-16 ENCOUNTER — Observation Stay (HOSPITAL_COMMUNITY): Payer: Medicare Other | Admitting: Anesthesiology

## 2022-11-16 ENCOUNTER — Encounter (HOSPITAL_COMMUNITY)
Admission: EM | Disposition: A | Discharge status: Hospice | Payer: Self-pay | Source: Home / Self Care | Attending: Internal Medicine

## 2022-11-16 DIAGNOSIS — I493 Ventricular premature depolarization: Secondary | ICD-10-CM | POA: Diagnosis not present

## 2022-11-16 DIAGNOSIS — C799 Secondary malignant neoplasm of unspecified site: Secondary | ICD-10-CM

## 2022-11-16 DIAGNOSIS — I5042 Chronic combined systolic (congestive) and diastolic (congestive) heart failure: Secondary | ICD-10-CM | POA: Diagnosis present

## 2022-11-16 DIAGNOSIS — E1122 Type 2 diabetes mellitus with diabetic chronic kidney disease: Secondary | ICD-10-CM | POA: Diagnosis present

## 2022-11-16 DIAGNOSIS — Z634 Disappearance and death of family member: Secondary | ICD-10-CM

## 2022-11-16 DIAGNOSIS — N421 Congestion and hemorrhage of prostate: Secondary | ICD-10-CM | POA: Diagnosis present

## 2022-11-16 DIAGNOSIS — I255 Ischemic cardiomyopathy: Secondary | ICD-10-CM | POA: Diagnosis present

## 2022-11-16 DIAGNOSIS — I10 Essential (primary) hypertension: Secondary | ICD-10-CM | POA: Diagnosis not present

## 2022-11-16 DIAGNOSIS — Z794 Long term (current) use of insulin: Secondary | ICD-10-CM

## 2022-11-16 DIAGNOSIS — I4729 Other ventricular tachycardia: Secondary | ICD-10-CM | POA: Diagnosis not present

## 2022-11-16 DIAGNOSIS — C61 Malignant neoplasm of prostate: Secondary | ICD-10-CM | POA: Diagnosis present

## 2022-11-16 DIAGNOSIS — I5022 Chronic systolic (congestive) heart failure: Secondary | ICD-10-CM | POA: Diagnosis not present

## 2022-11-16 DIAGNOSIS — N390 Urinary tract infection, site not specified: Secondary | ICD-10-CM | POA: Diagnosis not present

## 2022-11-16 DIAGNOSIS — D414 Neoplasm of uncertain behavior of bladder: Secondary | ICD-10-CM | POA: Diagnosis not present

## 2022-11-16 DIAGNOSIS — E1140 Type 2 diabetes mellitus with diabetic neuropathy, unspecified: Secondary | ICD-10-CM | POA: Diagnosis not present

## 2022-11-16 DIAGNOSIS — I480 Paroxysmal atrial fibrillation: Secondary | ICD-10-CM | POA: Diagnosis not present

## 2022-11-16 DIAGNOSIS — G629 Polyneuropathy, unspecified: Secondary | ICD-10-CM | POA: Diagnosis present

## 2022-11-16 DIAGNOSIS — C9110 Chronic lymphocytic leukemia of B-cell type not having achieved remission: Principal | ICD-10-CM | POA: Diagnosis present

## 2022-11-16 DIAGNOSIS — M353 Polymyalgia rheumatica: Secondary | ICD-10-CM | POA: Diagnosis not present

## 2022-11-16 DIAGNOSIS — N179 Acute kidney failure, unspecified: Secondary | ICD-10-CM | POA: Diagnosis not present

## 2022-11-16 DIAGNOSIS — Y738 Miscellaneous gastroenterology and urology devices associated with adverse incidents, not elsewhere classified: Secondary | ICD-10-CM | POA: Diagnosis present

## 2022-11-16 DIAGNOSIS — E1149 Type 2 diabetes mellitus with other diabetic neurological complication: Secondary | ICD-10-CM | POA: Diagnosis not present

## 2022-11-16 DIAGNOSIS — L089 Local infection of the skin and subcutaneous tissue, unspecified: Secondary | ICD-10-CM | POA: Diagnosis not present

## 2022-11-16 DIAGNOSIS — C679 Malignant neoplasm of bladder, unspecified: Secondary | ICD-10-CM | POA: Diagnosis not present

## 2022-11-16 DIAGNOSIS — Z87440 Personal history of urinary (tract) infections: Secondary | ICD-10-CM

## 2022-11-16 DIAGNOSIS — Z79899 Other long term (current) drug therapy: Secondary | ICD-10-CM

## 2022-11-16 DIAGNOSIS — I252 Old myocardial infarction: Secondary | ICD-10-CM

## 2022-11-16 DIAGNOSIS — F05 Delirium due to known physiological condition: Secondary | ICD-10-CM | POA: Diagnosis not present

## 2022-11-16 DIAGNOSIS — N139 Obstructive and reflux uropathy, unspecified: Secondary | ICD-10-CM | POA: Diagnosis present

## 2022-11-16 DIAGNOSIS — K8020 Calculus of gallbladder without cholecystitis without obstruction: Secondary | ICD-10-CM | POA: Diagnosis present

## 2022-11-16 DIAGNOSIS — Z66 Do not resuscitate: Secondary | ICD-10-CM | POA: Diagnosis not present

## 2022-11-16 DIAGNOSIS — E669 Obesity, unspecified: Secondary | ICD-10-CM | POA: Diagnosis present

## 2022-11-16 DIAGNOSIS — D630 Anemia in neoplastic disease: Secondary | ICD-10-CM | POA: Diagnosis not present

## 2022-11-16 DIAGNOSIS — M545 Low back pain, unspecified: Secondary | ICD-10-CM | POA: Diagnosis present

## 2022-11-16 DIAGNOSIS — T839XXA Unspecified complication of genitourinary prosthetic device, implant and graft, initial encounter: Principal | ICD-10-CM

## 2022-11-16 DIAGNOSIS — L03011 Cellulitis of right finger: Secondary | ICD-10-CM | POA: Diagnosis not present

## 2022-11-16 DIAGNOSIS — R52 Pain, unspecified: Secondary | ICD-10-CM | POA: Diagnosis not present

## 2022-11-16 DIAGNOSIS — N50819 Testicular pain, unspecified: Secondary | ICD-10-CM | POA: Diagnosis present

## 2022-11-16 DIAGNOSIS — I959 Hypotension, unspecified: Secondary | ICD-10-CM

## 2022-11-16 DIAGNOSIS — R310 Gross hematuria: Secondary | ICD-10-CM | POA: Diagnosis present

## 2022-11-16 DIAGNOSIS — R42 Dizziness and giddiness: Secondary | ICD-10-CM | POA: Diagnosis not present

## 2022-11-16 DIAGNOSIS — Z811 Family history of alcohol abuse and dependence: Secondary | ICD-10-CM

## 2022-11-16 DIAGNOSIS — Z515 Encounter for palliative care: Secondary | ICD-10-CM

## 2022-11-16 DIAGNOSIS — Z955 Presence of coronary angioplasty implant and graft: Secondary | ICD-10-CM

## 2022-11-16 DIAGNOSIS — R443 Hallucinations, unspecified: Secondary | ICD-10-CM | POA: Diagnosis not present

## 2022-11-16 DIAGNOSIS — K5900 Constipation, unspecified: Secondary | ICD-10-CM | POA: Diagnosis not present

## 2022-11-16 DIAGNOSIS — I130 Hypertensive heart and chronic kidney disease with heart failure and stage 1 through stage 4 chronic kidney disease, or unspecified chronic kidney disease: Secondary | ICD-10-CM | POA: Diagnosis not present

## 2022-11-16 DIAGNOSIS — C801 Malignant (primary) neoplasm, unspecified: Secondary | ICD-10-CM

## 2022-11-16 DIAGNOSIS — Z923 Personal history of irradiation: Secondary | ICD-10-CM

## 2022-11-16 DIAGNOSIS — I700 Atherosclerosis of aorta: Secondary | ICD-10-CM | POA: Diagnosis present

## 2022-11-16 DIAGNOSIS — I951 Orthostatic hypotension: Secondary | ICD-10-CM | POA: Diagnosis not present

## 2022-11-16 DIAGNOSIS — J690 Pneumonitis due to inhalation of food and vomit: Secondary | ICD-10-CM | POA: Diagnosis not present

## 2022-11-16 DIAGNOSIS — Z6833 Body mass index (BMI) 33.0-33.9, adult: Secondary | ICD-10-CM

## 2022-11-16 DIAGNOSIS — R590 Localized enlarged lymph nodes: Secondary | ICD-10-CM | POA: Diagnosis not present

## 2022-11-16 DIAGNOSIS — I2510 Atherosclerotic heart disease of native coronary artery without angina pectoris: Secondary | ICD-10-CM | POA: Diagnosis not present

## 2022-11-16 DIAGNOSIS — J189 Pneumonia, unspecified organism: Secondary | ICD-10-CM

## 2022-11-16 DIAGNOSIS — I472 Ventricular tachycardia, unspecified: Secondary | ICD-10-CM | POA: Diagnosis present

## 2022-11-16 DIAGNOSIS — Z888 Allergy status to other drugs, medicaments and biological substances status: Secondary | ICD-10-CM

## 2022-11-16 DIAGNOSIS — G4733 Obstructive sleep apnea (adult) (pediatric): Secondary | ICD-10-CM | POA: Diagnosis present

## 2022-11-16 DIAGNOSIS — D62 Acute posthemorrhagic anemia: Secondary | ICD-10-CM | POA: Diagnosis not present

## 2022-11-16 DIAGNOSIS — C8590 Non-Hodgkin lymphoma, unspecified, unspecified site: Secondary | ICD-10-CM | POA: Diagnosis present

## 2022-11-16 DIAGNOSIS — Z7189 Other specified counseling: Secondary | ICD-10-CM | POA: Diagnosis not present

## 2022-11-16 DIAGNOSIS — T83091A Other mechanical complication of indwelling urethral catheter, initial encounter: Secondary | ICD-10-CM | POA: Diagnosis not present

## 2022-11-16 DIAGNOSIS — R4589 Other symptoms and signs involving emotional state: Secondary | ICD-10-CM

## 2022-11-16 DIAGNOSIS — Z8249 Family history of ischemic heart disease and other diseases of the circulatory system: Secondary | ICD-10-CM

## 2022-11-16 DIAGNOSIS — I110 Hypertensive heart disease with heart failure: Secondary | ICD-10-CM | POA: Diagnosis not present

## 2022-11-16 DIAGNOSIS — N1831 Chronic kidney disease, stage 3a: Secondary | ICD-10-CM | POA: Diagnosis present

## 2022-11-16 DIAGNOSIS — C674 Malignant neoplasm of posterior wall of bladder: Secondary | ICD-10-CM | POA: Diagnosis not present

## 2022-11-16 DIAGNOSIS — D400 Neoplasm of uncertain behavior of prostate: Secondary | ICD-10-CM | POA: Diagnosis not present

## 2022-11-16 DIAGNOSIS — E1165 Type 2 diabetes mellitus with hyperglycemia: Secondary | ICD-10-CM | POA: Diagnosis not present

## 2022-11-16 DIAGNOSIS — R338 Other retention of urine: Secondary | ICD-10-CM | POA: Diagnosis not present

## 2022-11-16 DIAGNOSIS — I509 Heart failure, unspecified: Secondary | ICD-10-CM

## 2022-11-16 DIAGNOSIS — Z87891 Personal history of nicotine dependence: Secondary | ICD-10-CM

## 2022-11-16 DIAGNOSIS — E785 Hyperlipidemia, unspecified: Secondary | ICD-10-CM | POA: Diagnosis not present

## 2022-11-16 DIAGNOSIS — Z9079 Acquired absence of other genital organ(s): Secondary | ICD-10-CM

## 2022-11-16 DIAGNOSIS — B3749 Other urogenital candidiasis: Secondary | ICD-10-CM | POA: Diagnosis present

## 2022-11-16 DIAGNOSIS — Z809 Family history of malignant neoplasm, unspecified: Secondary | ICD-10-CM

## 2022-11-16 DIAGNOSIS — Z8719 Personal history of other diseases of the digestive system: Secondary | ICD-10-CM

## 2022-11-16 DIAGNOSIS — R319 Hematuria, unspecified: Secondary | ICD-10-CM | POA: Diagnosis not present

## 2022-11-16 DIAGNOSIS — N5089 Other specified disorders of the male genital organs: Secondary | ICD-10-CM | POA: Diagnosis present

## 2022-11-16 DIAGNOSIS — Z95810 Presence of automatic (implantable) cardiac defibrillator: Secondary | ICD-10-CM

## 2022-11-16 DIAGNOSIS — Z7901 Long term (current) use of anticoagulants: Secondary | ICD-10-CM

## 2022-11-16 DIAGNOSIS — E875 Hyperkalemia: Secondary | ICD-10-CM | POA: Diagnosis present

## 2022-11-16 DIAGNOSIS — M19041 Primary osteoarthritis, right hand: Secondary | ICD-10-CM | POA: Diagnosis not present

## 2022-11-16 DIAGNOSIS — Z87442 Personal history of urinary calculi: Secondary | ICD-10-CM

## 2022-11-16 DIAGNOSIS — M7989 Other specified soft tissue disorders: Secondary | ICD-10-CM | POA: Diagnosis not present

## 2022-11-16 DIAGNOSIS — D4110 Neoplasm of uncertain behavior of unspecified renal pelvis: Secondary | ICD-10-CM | POA: Diagnosis not present

## 2022-11-16 DIAGNOSIS — L03012 Cellulitis of left finger: Secondary | ICD-10-CM | POA: Diagnosis not present

## 2022-11-16 DIAGNOSIS — Z8674 Personal history of sudden cardiac arrest: Secondary | ICD-10-CM

## 2022-11-16 DIAGNOSIS — R7881 Bacteremia: Secondary | ICD-10-CM | POA: Diagnosis not present

## 2022-11-16 DIAGNOSIS — N183 Chronic kidney disease, stage 3 unspecified: Secondary | ICD-10-CM | POA: Diagnosis present

## 2022-11-16 DIAGNOSIS — E876 Hypokalemia: Secondary | ICD-10-CM | POA: Diagnosis present

## 2022-11-16 HISTORY — PX: CYSTOSCOPY WITH FULGERATION: SHX6638

## 2022-11-16 LAB — CBC WITH DIFFERENTIAL/PLATELET
Abs Immature Granulocytes: 0.04 10*3/uL (ref 0.00–0.07)
Basophils Absolute: 0.1 10*3/uL (ref 0.0–0.1)
Basophils Relative: 1 %
Eosinophils Absolute: 0.2 10*3/uL (ref 0.0–0.5)
Eosinophils Relative: 2 %
HCT: 43.2 % (ref 39.0–52.0)
Hemoglobin: 13.8 g/dL (ref 13.0–17.0)
Immature Granulocytes: 0 %
Lymphocytes Relative: 30 %
Lymphs Abs: 2.9 10*3/uL (ref 0.7–4.0)
MCH: 28.7 pg (ref 26.0–34.0)
MCHC: 31.9 g/dL (ref 30.0–36.0)
MCV: 89.8 fL (ref 80.0–100.0)
Monocytes Absolute: 1 10*3/uL (ref 0.1–1.0)
Monocytes Relative: 10 %
Neutro Abs: 5.6 10*3/uL (ref 1.7–7.7)
Neutrophils Relative %: 57 %
Platelets: 215 10*3/uL (ref 150–400)
RBC: 4.81 MIL/uL (ref 4.22–5.81)
RDW: 12.9 % (ref 11.5–15.5)
WBC: 9.9 10*3/uL (ref 4.0–10.5)
nRBC: 0 % (ref 0.0–0.2)

## 2022-11-16 LAB — COMPREHENSIVE METABOLIC PANEL
ALT: 17 U/L (ref 0–44)
AST: 27 U/L (ref 15–41)
Albumin: 3.4 g/dL — ABNORMAL LOW (ref 3.5–5.0)
Alkaline Phosphatase: 68 U/L (ref 38–126)
Anion gap: 10 (ref 5–15)
BUN: 19 mg/dL (ref 8–23)
CO2: 20 mmol/L — ABNORMAL LOW (ref 22–32)
Calcium: 9.3 mg/dL (ref 8.9–10.3)
Chloride: 105 mmol/L (ref 98–111)
Creatinine, Ser: 1.6 mg/dL — ABNORMAL HIGH (ref 0.61–1.24)
GFR, Estimated: 44 mL/min — ABNORMAL LOW (ref >60)
Glucose, Bld: 305 mg/dL — ABNORMAL HIGH (ref 70–99)
Potassium: 4.8 mmol/L (ref 3.5–5.1)
Sodium: 135 mmol/L (ref 135–145)
Total Bilirubin: 0.6 mg/dL (ref 0.3–1.2)
Total Protein: 6.7 g/dL (ref 6.5–8.1)

## 2022-11-16 LAB — GLUCOSE, CAPILLARY
Glucose-Capillary: 323 mg/dL — ABNORMAL HIGH (ref 70–99)
Glucose-Capillary: 351 mg/dL — ABNORMAL HIGH (ref 70–99)

## 2022-11-16 LAB — TYPE AND SCREEN
ABO/RH(D): O POS
Antibody Screen: NEGATIVE

## 2022-11-16 SURGERY — CYSTOSCOPY, WITH BLADDER FULGURATION
Anesthesia: General

## 2022-11-16 MED ORDER — FENTANYL CITRATE PF 50 MCG/ML IJ SOSY
PREFILLED_SYRINGE | INTRAMUSCULAR | Status: AC
  Filled 2022-11-16: qty 1

## 2022-11-16 MED ORDER — TAMSULOSIN HCL 0.4 MG PO CAPS: 0.4000 mg | ORAL_CAPSULE | Freq: Every day | ORAL | Status: DC

## 2022-11-16 MED ORDER — DEXAMETHASONE SODIUM PHOSPHATE 10 MG/ML IJ SOLN
INTRAMUSCULAR | Status: DC | PRN
  Administered 2022-11-16: 10 mg via INTRAVENOUS

## 2022-11-16 MED ORDER — LIDOCAINE HCL (PF) 2 % IJ SOLN
INTRAMUSCULAR | Status: AC
  Filled 2022-11-16: qty 5

## 2022-11-16 MED ORDER — ONDANSETRON HCL 4 MG/2ML IJ SOLN: 4.0000 mg | Freq: Four times a day (QID) | INTRAMUSCULAR | Status: DC | PRN

## 2022-11-16 MED ORDER — ACETAMINOPHEN 10 MG/ML IV SOLN
INTRAVENOUS | Status: DC | PRN
  Administered 2022-11-16: 1000 mg via INTRAVENOUS

## 2022-11-16 MED ORDER — FUROSEMIDE 20 MG PO TABS: 20.0000 mg | ORAL_TABLET | Freq: Every evening | ORAL | Status: DC

## 2022-11-16 MED ORDER — FENTANYL CITRATE (PF) 100 MCG/2ML IJ SOLN
INTRAMUSCULAR | Status: AC
  Filled 2022-11-16: qty 2

## 2022-11-16 MED ORDER — ACETAMINOPHEN 650 MG RE SUPP: 650.0000 mg | Freq: Four times a day (QID) | RECTAL | Status: DC | PRN

## 2022-11-16 MED ORDER — FENTANYL CITRATE PF 50 MCG/ML IJ SOSY
25.0000 ug | PREFILLED_SYRINGE | INTRAMUSCULAR | Status: DC | PRN
  Administered 2022-11-16 (×2): 50 ug via INTRAVENOUS

## 2022-11-16 MED ORDER — LACTATED RINGERS IV SOLN: INTRAVENOUS | Status: DC | PRN

## 2022-11-16 MED ORDER — PHENYLEPHRINE 80 MCG/ML (10ML) SYRINGE FOR IV PUSH (FOR BLOOD PRESSURE SUPPORT)
PREFILLED_SYRINGE | INTRAVENOUS | Status: DC | PRN
  Administered 2022-11-16: 80 ug via INTRAVENOUS
  Administered 2022-11-16: 160 ug via INTRAVENOUS

## 2022-11-16 MED ORDER — ONDANSETRON HCL 4 MG/2ML IJ SOLN
INTRAMUSCULAR | Status: AC
  Filled 2022-11-16: qty 2

## 2022-11-16 MED ORDER — FUROSEMIDE 40 MG PO TABS
40.0000 mg | ORAL_TABLET | ORAL | Status: DC
  Administered 2022-11-18 – 2022-11-22 (×3): 40 mg via ORAL
  Filled 2022-11-16 (×3): qty 1

## 2022-11-16 MED ORDER — FENTANYL CITRATE (PF) 100 MCG/2ML IJ SOLN
INTRAMUSCULAR | Status: DC | PRN
  Administered 2022-11-16: 100 ug via INTRAVENOUS

## 2022-11-16 MED ORDER — OXYCODONE HCL 5 MG/5ML PO SOLN: 5.0000 mg | Freq: Once | ORAL | Status: AC | PRN

## 2022-11-16 MED ORDER — ACETAMINOPHEN 10 MG/ML IV SOLN: 1000.0000 mg | Freq: Once | INTRAVENOUS | Status: DC | PRN

## 2022-11-16 MED ORDER — ACETAMINOPHEN 325 MG PO TABS
650.0000 mg | ORAL_TABLET | Freq: Four times a day (QID) | ORAL | Status: DC | PRN
  Administered 2022-11-24 – 2022-12-07 (×6): 650 mg via ORAL
  Filled 2022-11-16 (×6): qty 2

## 2022-11-16 MED ORDER — SPIRONOLACTONE 25 MG PO TABS: 25.0000 mg | ORAL_TABLET | Freq: Every morning | ORAL | Status: DC

## 2022-11-16 MED ORDER — FUROSEMIDE 20 MG PO TABS
20.0000 mg | ORAL_TABLET | ORAL | Status: DC
  Administered 2022-11-17 – 2022-11-21 (×3): 20 mg via ORAL
  Filled 2022-11-16 (×6): qty 1

## 2022-11-16 MED ORDER — ROSUVASTATIN CALCIUM 20 MG PO TABS
40.0000 mg | ORAL_TABLET | Freq: Every morning | ORAL | Status: DC
  Administered 2022-11-17 – 2022-12-04 (×17): 40 mg via ORAL
  Filled 2022-11-16 (×17): qty 2

## 2022-11-16 MED ORDER — FENTANYL CITRATE PF 50 MCG/ML IJ SOSY
50.0000 ug | PREFILLED_SYRINGE | Freq: Once | INTRAMUSCULAR | Status: AC
  Administered 2022-11-16: 50 ug via INTRAVENOUS
  Filled 2022-11-16: qty 1

## 2022-11-16 MED ORDER — CIPROFLOXACIN IN D5W 400 MG/200ML IV SOLN
INTRAVENOUS | Status: AC
  Filled 2022-11-16: qty 200

## 2022-11-16 MED ORDER — OXYCODONE-ACETAMINOPHEN 5-325 MG PO TABS: 1.0000 | ORAL_TABLET | Freq: Once | ORAL | Status: DC

## 2022-11-16 MED ORDER — ONDANSETRON HCL 4 MG/2ML IJ SOLN
INTRAMUSCULAR | Status: DC | PRN
  Administered 2022-11-16: 4 mg via INTRAVENOUS

## 2022-11-16 MED ORDER — OXYCODONE HCL 5 MG PO TABS
ORAL_TABLET | ORAL | Status: AC
  Filled 2022-11-16: qty 1

## 2022-11-16 MED ORDER — LIDOCAINE 2% (20 MG/ML) 5 ML SYRINGE
INTRAMUSCULAR | Status: DC | PRN
  Administered 2022-11-16: 100 mg via INTRAVENOUS

## 2022-11-16 MED ORDER — CIPROFLOXACIN IN D5W 400 MG/200ML IV SOLN
INTRAVENOUS | Status: DC | PRN
  Administered 2022-11-16: 400 mg via INTRAVENOUS

## 2022-11-16 MED ORDER — LIDOCAINE HCL URETHRAL/MUCOSAL 2 % EX GEL
1.0000 | Freq: Once | CUTANEOUS | Status: AC
  Administered 2022-11-16: 1 via TOPICAL
  Filled 2022-11-16: qty 6

## 2022-11-16 MED ORDER — OXYCODONE HCL 5 MG PO TABS
5.0000 mg | ORAL_TABLET | Freq: Once | ORAL | Status: AC | PRN
  Administered 2022-11-16: 5 mg via ORAL

## 2022-11-16 MED ORDER — CIPROFLOXACIN IN D5W 400 MG/200ML IV SOLN
400.0000 mg | Freq: Two times a day (BID) | INTRAVENOUS | Status: AC
  Administered 2022-11-16 – 2022-11-21 (×11): 400 mg via INTRAVENOUS
  Filled 2022-11-16 (×11): qty 200

## 2022-11-16 MED ORDER — OXYCODONE HCL 5 MG PO TABS
5.0000 mg | ORAL_TABLET | ORAL | Status: DC | PRN
  Administered 2022-11-16 – 2022-11-24 (×17): 5 mg via ORAL
  Filled 2022-11-16 (×17): qty 1

## 2022-11-16 MED ORDER — SODIUM CHLORIDE 0.9 % IR SOLN
Status: DC | PRN
  Administered 2022-11-16 (×3): 3000 mL via INTRAVESICAL

## 2022-11-16 MED ORDER — DEXAMETHASONE SODIUM PHOSPHATE 10 MG/ML IJ SOLN
INTRAMUSCULAR | Status: AC
  Filled 2022-11-16: qty 1

## 2022-11-16 MED ORDER — LOSARTAN POTASSIUM 25 MG PO TABS: 25.0000 mg | ORAL_TABLET | Freq: Every day | ORAL | Status: DC

## 2022-11-16 MED ORDER — PROPOFOL 10 MG/ML IV BOLUS
INTRAVENOUS | Status: DC | PRN
  Administered 2022-11-16: 150 mg via INTRAVENOUS

## 2022-11-16 MED ORDER — CARVEDILOL 12.5 MG PO TABS
12.5000 mg | ORAL_TABLET | Freq: Two times a day (BID) | ORAL | Status: DC
  Administered 2022-11-16 – 2022-11-22 (×12): 12.5 mg via ORAL
  Filled 2022-11-16 (×13): qty 1

## 2022-11-16 MED ORDER — INSULIN ASPART 100 UNIT/ML IJ SOLN
0.0000 [IU] | Freq: Three times a day (TID) | INTRAMUSCULAR | Status: DC
  Administered 2022-11-16: 11 [IU] via SUBCUTANEOUS
  Administered 2022-11-17: 5 [IU] via SUBCUTANEOUS
  Administered 2022-11-17: 11 [IU] via SUBCUTANEOUS
  Administered 2022-11-17: 8 [IU] via SUBCUTANEOUS
  Administered 2022-11-18: 3 [IU] via SUBCUTANEOUS
  Administered 2022-11-18: 5 [IU] via SUBCUTANEOUS
  Administered 2022-11-18 – 2022-11-19 (×3): 3 [IU] via SUBCUTANEOUS
  Administered 2022-11-19 – 2022-11-20 (×4): 5 [IU] via SUBCUTANEOUS
  Administered 2022-11-21: 3 [IU] via SUBCUTANEOUS
  Administered 2022-11-21: 5 [IU] via SUBCUTANEOUS
  Administered 2022-11-21 – 2022-11-22 (×2): 3 [IU] via SUBCUTANEOUS
  Administered 2022-11-22 – 2022-11-23 (×2): 2 [IU] via SUBCUTANEOUS
  Administered 2022-11-24: 3 [IU] via SUBCUTANEOUS
  Administered 2022-11-24: 2 [IU] via SUBCUTANEOUS
  Administered 2022-11-24: 3 [IU] via SUBCUTANEOUS
  Administered 2022-11-25: 2 [IU] via SUBCUTANEOUS
  Administered 2022-11-25 – 2022-11-28 (×8): 3 [IU] via SUBCUTANEOUS
  Administered 2022-11-28 – 2022-12-02 (×7): 2 [IU] via SUBCUTANEOUS

## 2022-11-16 MED ORDER — ONDANSETRON HCL 4 MG PO TABS: 4.0000 mg | ORAL_TABLET | Freq: Four times a day (QID) | ORAL | Status: DC | PRN

## 2022-11-16 MED ORDER — ONDANSETRON HCL 4 MG/2ML IJ SOLN: 4.0000 mg | Freq: Once | INTRAMUSCULAR | Status: DC | PRN

## 2022-11-16 MED ORDER — PROPOFOL 10 MG/ML IV BOLUS
INTRAVENOUS | Status: AC
  Filled 2022-11-16: qty 20

## 2022-11-16 MED ORDER — ACETAMINOPHEN 10 MG/ML IV SOLN
INTRAVENOUS | Status: AC
  Filled 2022-11-16: qty 100

## 2022-11-16 MED ORDER — SODIUM CHLORIDE 0.9 % IR SOLN
3000.0000 mL | Status: DC
  Administered 2022-11-23 – 2022-11-25 (×3): 3000 mL

## 2022-11-16 SURGICAL SUPPLY — 19 items
BAG DRN RND TRDRP ANRFLXCHMBR (UROLOGICAL SUPPLIES)
BAG URINE DRAIN 2000ML AR STRL (UROLOGICAL SUPPLIES) IMPLANT
BAG URO CATCHER STRL LF (MISCELLANEOUS) ×1 IMPLANT
CATH HEMA 3WAY 30CC 24FR COUDE (CATHETERS) IMPLANT
DRAPE FOOT SWITCH (DRAPES) ×1 IMPLANT
ELECT REM PT RETURN 15FT ADLT (MISCELLANEOUS) ×1 IMPLANT
GLOVE SURG LX STRL 7.5 STRW (GLOVE) ×1 IMPLANT
GOWN STRL REUS W/ TWL XL LVL3 (GOWN DISPOSABLE) ×1 IMPLANT
GOWN STRL REUS W/TWL XL LVL3 (GOWN DISPOSABLE) ×1
KIT TURNOVER KIT A (KITS) IMPLANT
LOOP CUT BIPOLAR 24F LRG (ELECTROSURGICAL) IMPLANT
MANIFOLD NEPTUNE II (INSTRUMENTS) ×1 IMPLANT
PACK CYSTO (CUSTOM PROCEDURE TRAY) ×1 IMPLANT
PENCIL SMOKE EVACUATOR (MISCELLANEOUS) IMPLANT
SYR 30ML LL (SYRINGE) IMPLANT
SYR TOOMEY IRRIG 70ML (MISCELLANEOUS)
SYRINGE TOOMEY IRRIG 70ML (MISCELLANEOUS) IMPLANT
TUBING CONNECTING 10 (TUBING) ×1 IMPLANT
TUBING UROLOGY SET (TUBING) ×1 IMPLANT

## 2022-11-16 NOTE — ED Triage Notes (Signed)
Patient has indwelling catheter but has had clots and bright red blood that has been clotting off catheter and having to be flushed x 4 days.  Flushed in triage but still not draining.

## 2022-11-16 NOTE — Anesthesia Procedure Notes (Signed)
Procedure Name: LMA Insertion Date/Time: 11/16/2022 12:04 PM  Performed by: Renato Shin, CRNAPre-anesthesia Checklist: Patient identified, Emergency Drugs available, Suction available and Patient being monitored Patient Re-evaluated:Patient Re-evaluated prior to induction Oxygen Delivery Method: Circle system utilized Preoxygenation: Pre-oxygenation with 100% oxygen Induction Type: IV induction Ventilation: Mask ventilation without difficulty LMA: LMA inserted LMA Size: 4.0 Number of attempts: 1 Placement Confirmation: positive ETCO2 and breath sounds checked- equal and bilateral Tube secured with: Tape Dental Injury: Teeth and Oropharynx as per pre-operative assessment

## 2022-11-16 NOTE — Anesthesia Postprocedure Evaluation (Signed)
Anesthesia Post Note  Patient: [place]  Procedure(s) Performed: [place]; CLOT EVACUATION     Patient location during evaluation: PACU Anesthesia Type: General Level of consciousness: awake and alert Pain management: pain level controlled Vital Signs Assessment: post-procedure vital signs reviewed and stable Respiratory status: spontaneous breathing, nonlabored ventilation, respiratory function stable and patient connected to nasal cannula oxygen Cardiovascular status: blood pressure returned to baseline and stable Postop Assessment: no apparent nausea or vomiting Anesthetic complications: no  No notable events documented.  Last Vitals:  Vitals:   11/16/22 1245 11/16/22 1300  BP: 119/68 107/64  Pulse: 84 82  Resp:    Temp:    SpO2: 99% 94%    Last Pain:  Vitals:   11/16/22 1255  PainSc: 2                  Lecil Tapp S

## 2022-11-16 NOTE — Op Note (Addendum)
Operative Note  Preoperative diagnosis:  1.  Clot urinary retention  Postoperative diagnosis: 1.  Clot urinary retention  Procedure(s): 1.  Cystoscopy with clot evacuation and fulguration  Surgeon: Ellison Hughs, MD  Assistants:  None  Anesthesia:  General  Complications:  None  EBL: 500 mL of clot evacuated from bladder  Specimens: 1.  None  Drains/Catheters: 1.  24 French three-way hematuria catheter with 30 mill of sterile water in the balloon  Intraoperative findings:   Denuded prostatic urethra, which was the primary source of bleeding Extensive detrusor trabeculation, but no identifiable papillary or sessile mass was seen within the bladder  Indication:  Wayne [name] is a 81 y.o. male with a history of prostate cancer, T1 urothelial carcinoma of the bladder, urethral stricture disease and recurrent UTIs.  The patient presented to the emergency department with clot urinary retention, despite extensive Foley irrigation.  He has been consented for the above procedures, voices understanding wish to proceed.  Description of procedure:  After informed consent was obtained, the patient was brought to the operating room and general LMA anesthesia was administered. The patient was then placed in the dorsolithotomy position and prepped and draped in the usual sterile fashion. A timeout was performed. A 23 French rigid cystoscope was then inserted into the urethral meatus and advanced into the bladder under direct vision. A complete bladder survey revealed the findings listed above.  The 23 French rigid cystoscope was then exchanged for a 26 [place] resectoscope with a bipolar loop working element.  A large amount of clot was evacuated through the sheath of the resectoscope.  The majority of the bleeding originated from the prostatic urethra as well as the trigone/posterior bladder wall.  Extensive bladder trabeculation was seen throughout, but no distinct mass could be  identified.  Once hemostasis was achieved, the resectoscope was removed.  A 24 [place] three-way hematuria catheter was then placed and its balloon was inflated with 30 mL of sterile water.  CBI was started.  The patient tolerated the procedure well and was transferred to the postanesthesia in stable condition.  Plan: Keep Foley catheter in place.  CBI.  Hold Eliquis for at least 24 hours

## 2022-11-16 NOTE — H&P (Signed)
History and Physical    Patient: Wayne [name] ZWC:[id number] DOB: Apr 09, 1941 DOA: 11/16/2022 DOS: the patient was seen and examined on 11/16/2022 PCP: [name], MD  Patient coming from: Home  Chief Complaint:  Chief Complaint  Patient presents with   Hematuria   HPI: Wayne [name] is a 81 y.o. male with medical history significant of cardiac arrest, AICD placement, coronary arthrosclerosis, history of NSTEMI, drug-eluting stent placement, paroxysmal atrial fibrillation on apixaban, hypertension, chronic combined systolic and diastolic heart failure, osteoarthritis, cellulitis of the left lower extremity, history of anemia, history of herpes zoster ophthalmicus, noncompliance, obesity, OSA not on CPAP, peripheral neuropathy, type 2 diabetes, polymyalgia rheumatica, urolithiasis, bladder cancer, prostate cancer, with history of brachytherapy in 2009, recurrent high-grade T1 bladder cancer, urethral stricture requiring multiple dilatations and recurring UTIs who presented to [facility] emergency department with gross hematuria and Foley catheter dysfunction.  He has been seen hematuria and has had catheter dysfunction for the past few days.  While he was in the ER, attempts to to irrigate his catheter and even exchange his catheter were performed, but these measures were unsuccessful relieving the obstruction and clotted blood.  His last PSA was undetectable.  Recent PET CT scan showed a posterior bladder wall mass concerning for either bladder or prostate CA recurrence. He has had suprapubic pain, but no flank pain or hematuria.  Denied fever, chills, rhinorrhea, sore throat, wheezing or hemoptysis.  No chest pain, palpitations, diaphoresis, PND, orthopnea, but occasionally develops pitting edema of the lower extremities.  No abdominal pain, nausea, emesis, diarrhea, constipation, melena or hematochezia. No polyuria, polydipsia, polyphagia or blurred vision.   ED course: Initial vital signs were  temperature 98.5 F, pulse 96, respiration 24, BP 109/79 mmHg O2 sat 100% on room air.  The patient received fentanyl 50 mcg IVP and 2% lidocaine jelly was used during Foley exchange attempt.  While in PACU, the patient received oxycodone 5 mg p.o. x 2 and continuous bladder irrigation was initiated.  Please see Dr. Jackson Latino consult and procedure notes for further information  Lab work: CBC was normal with a white count of 9.9, hemoglobin 13.8 g/dL platelets 215.  CBC with a CO2 of 20 mmol/L.  Glucose 305 and creatinine 1.60 mg/dL.  Albumin was 3.4 g/dL.  The rest of the CMP measurements were normal.   Review of Systems: As mentioned in the history of present illness. All other systems reviewed and are negative.  Past Medical History:  Diagnosis Date   AICD (automatic cardioverter/defibrillator) present    Arthritis    Bladder cancer ([place])    Cancer ([place])    bladder and prostate   Cellulitis of left leg    a. 01/3535 complicated by septic shock   Chronic systolic CHF (congestive heart failure) (HCC)    CKD (chronic kidney disease), stage III ([place])    pt denies   Colon polyps    CORONARY ATHEROSCLEROSIS NATIVE CORONARY ARTERY cardiologist-  dr Aundra Dubin   a. 01/2011 Cath/PCI: LM nl, LAD 40-50p, D1 80-small, LCX 95-small, RI 90, RCA 100, EF 20%;  b. 01/2011 Card MRI - No transmural scar;  c. 01/2011 PCI RCA->5 Promus DES, RI->3.0x16 Promus DES; d. Cath 01/13/13 patent LAD & Ramus, diffuse LCx dz, RCA mult overlapping stents w/ 95% osital stenosis, EF 20%, s/p DES to ostial/prox RCA 01/24/13    Diabetes mellitus, type 2 ([place])    type 2   Hematoma of leg    a. left leg hematoma  03/2012 in the setting of asa/effient   Herpes zoster ophthalmicus    neuropathy   History of anemia    History of kidney stones    History of non-ST elevation myocardial infarction (NSTEMI) 06/2012   History of prostate cancer    dx 2009---  s/p radioactive seed implants 11-24-2008   HYPERLIPIDEMIA    intolerant to Lipitor  (myalgias)   HYPERTENSION    Ischemic cardiomyopathy    a. 01/2012 Echo EF 45%, mild LVH; b. JJ94%, grade 1 diastolic dysfunction, diffuse hypokinesis, inferoposterior akinesis    Myocardial infarction ([place])    x2   Noncompliance    Obesity    OSA (obstructive sleep apnea)    dx 2012  severe osa  no cpap just uses nose strips   PAF (paroxysmal atrial fibrillation) ([place])    first dx 01-16-2017   Peripheral neuropathy    Polymyalgia rheumatica ([place])    Prostate cancer ([place])    S/P drug eluting coronary stent placement    total 6 DES   Past Surgical History:  Procedure Laterality Date   CARDIAC CATHETERIZATION  01/24/2013   CARDIAC CATHETERIZATION N/A 11/14/2016   Procedure: Right/Left Heart Cath and Coronary Angiography;  Surgeon: Larey Dresser, MD;  Location: [facility];  Service: Cardiovascular;  Laterality: N/A;   CARDIAC CATHETERIZATION N/A 11/17/2016   Procedure: Coronary Stent Intervention w/Impella;  Surgeon: Peter M Martinique, MD;  Location: [place] [facility];  Service: Cardiovascular;  Laterality: N/A;   CARDIAC CATHETERIZATION N/A 11/17/2016   Procedure: Coronary Atherectomy;  Surgeon: Peter M Martinique, MD;  Location: [place] [facility];  Service: Cardiovascular;  Laterality: N/A;   CORONARY ANGIOPLASTY WITH STENT PLACEMENT  01-30-2011   dr Aundra Dubin   PTCA and multiple overlapping DEStenting to RCA and 1 DES to RI    CORONARY BALLOON ANGIOPLASTY N/A 07/06/2017   Procedure: Coronary Balloon Angioplasty;  Surgeon: Sherren Mocha, MD;  Location: [place] [facility];  Service: Cardiovascular;  Laterality: N/A;   CYSTOSCOPY WITH FULGERATION N/A 05/25/2018   Procedure: CYSTOSCOPY WITH FULGERATION/BLADDER BIOPSY , 15 mm lesion;  Surgeon: Festus Aloe, MD;  Location: [facility];  Service: Urology;  Laterality: N/A;   CYSTOSCOPY WITH URETHRAL DILATATION N/A 08/09/2021   Procedure: CYSTOSCOPY WITH URETHRAL DILATATION WITH OPTILUME;  Surgeon: Festus Aloe, MD;  Location: [facility];   Service: Urology;  Laterality: N/A;   CYSTOSCOPY WITH URETHRAL DILATATION N/A 07/01/2022   Procedure: CYSTOSCOPY WITH BALLOON DILATATION OF PROSTATIC URETHRA;  Surgeon: Festus Aloe, MD;  [facility];  Service: Urology;  Laterality: N/A;   I & D EXTREMITY  06/15/2012   Procedure: IRRIGATION AND DEBRIDEMENT EXTREMITY;  Surgeon: Newt Minion, MD;  Location: [place];  Service: Orthopedics;  Laterality: Left;  I&D Left Posterior Knee   I & D EXTREMITY  06/30/2012   Procedure: IRRIGATION AND DEBRIDEMENT EXTREMITY;  Surgeon: Newt Minion, MD;  Location: [facility];  Service: Orthopedics;  Laterality: Left;  Left Leg Irrigation and Debridement and placement of Wound VAC and application of  A-cell   I & D EXTREMITY  07/20/2012   Procedure: IRRIGATION AND DEBRIDEMENT EXTREMITY;  Surgeon: Newt Minion, MD;  Location: [facility];  Service: Orthopedics;  Laterality: Left;  Irrigation and Debridement Left Leg and Place antibiotic beads    ICD IMPLANT N/A 02/12/2017   Procedure: ICD Implant;  Surgeon: Evans Lance, MD;  Location: [place] [facility];  Service: Cardiovascular;  Laterality: N/A;   LAPAROSCOPIC INGUINAL  HERNIA REPAIR Right 09-11-2008   dr Barry Dienes  [place] REVISION/REPAIR N/A 05/26/2017   Procedure: Lead Revision/Repair;  Surgeon: Evans Lance, MD;  Location: [place] [facility];  Service: Cardiovascular;  Laterality: N/A;   LEFT HEART CATH AND CORONARY ANGIOGRAPHY N/A 07/06/2017   Procedure: Left Heart Cath and Coronary Angiography;  Surgeon: Larey Dresser, MD;  Location: [place] [facility];  Service: Cardiovascular;  Laterality: N/A;   PERCUTANEOUS CORONARY STENT INTERVENTION (PCI-S) N/A 01/24/2013   Procedure: PERCUTANEOUS CORONARY STENT INTERVENTION (PCI-S);  Surgeon: Wellington Hampshire, MD;  Location: [facility];  Service: Cardiovascular;  Laterality: N/A;   RADIOACTIVE PROSTATE SEED IMPLANTS  11-24-2008   dr Tresa Endo  [place] TUMOR N/A 10/30/2017    Procedure: TRANSURETHRAL RESECTION OF BLADDER TUMOR (TURBT)/ INSTILLATION OF EPIRUBICIN;  Surgeon: Festus Aloe, MD;  [facility];  Service: Urology;  Laterality: N/A;   TRANSURETHRAL RESECTION OF BLADDER TUMOR N/A 05/18/2020   Procedure: TRANSURETHRAL RESECTION OF BLADDER TUMOR (TURBT) 0.5-2.0 cm BILATERAL RETROGRADE PYELOGRAM;  Surgeon: Festus Aloe, MD;  [facility];  Service: Urology;  Laterality: N/A;   ULTRASOUND GUIDANCE FOR VASCULAR ACCESS  11/14/2016   Procedure: Ultrasound Guidance For Vascular Access;  Surgeon: Larey Dresser, MD;  Location: [place] [facility];  Service: Cardiovascular;;   V TACH ABLATION N/A 06/19/2017   Procedure: V Tach Ablation;  Surgeon: Evans Lance, MD;  Location: [place] [facility];  Service: Cardiovascular;  Laterality: N/A;   Social History:  reports that he quit smoking about 33 years ago. His smoking use included cigarettes. He has a 6.00 pack-year smoking history. He has never used smokeless tobacco. He reports that he does not drink alcohol and does not use drugs.  Allergies  Allergen Reactions   Entresto [Sacubitril-Valsartan] Swelling and Other (See Comments)    Angioedema   Crestor [Rosuvastatin Calcium] Other (See Comments)    Myalgia, Interfering with Gait in high doses   Lipitor [Atorvastatin] Other (See Comments)    Makes legs sore   Plavix [Clopidogrel] Hives, Itching, Swelling and Other (See Comments)    Nose bleeds and welts on legs & back   Ancef [Cefazolin] Itching, Rash and Other (See Comments)    Describes itching and rash, but said that "it wasn't that bad"    Family History  Problem Relation Age of Onset   Cancer Mother 35       unknown CA   Heart disease Father    Alcohol abuse Father    Heart attack Father 69   Heart attack Brother     Prior to Admission medications   Medication Sig Start Date End Date Taking? Authorizing Provider  bacitracin ointment Apply 1 Application topically 2 (two) times daily.  Apply to penis tip 10/07/22   Elgie Congo, MD  carvedilol (COREG) 12.5 MG tablet TAKE 1 TABLET (12.5 MG TOTAL) BY MOUTH 2 (TWO) TIMES DAILY WITH A MEAL. 11/05/22   Clegg, Amy D, NP  dapagliflozin propanediol (FARXIGA) 10 MG TABS tablet Take 1 tablet (10 mg total) by mouth daily before breakfast. Patient not taking: Reported on 06/20/2022 04/04/22   Rafael Bihari, FNP  diphenhydramine-acetaminophen (TYLENOL PM) 25-500 MG TABS tablet Take 1 tablet at bedtime as needed by mouth (for sleep).     [provider]  doxycycline (VIBRAMYCIN) 100 MG capsule Take 1 capsule (100 mg total) by mouth 2 (two) times daily. 10/07/22   Nechama Guard,  Valda Lamb, MD  ELIQUIS 5 MG TABS tablet TAKE 1 TABLET BY MOUTH TWICE A DAY 08/04/22   Larey Dresser, MD  furosemide (LASIX) 20 MG tablet Take '20mg'$  (1 tablet)by mouth daily alternating with '40mg'$  (2 tablets) every other day. Patient taking differently: Take 20 mg by mouth every evening. 04/04/22   [place], Maricela Bo, FNP  glucose blood (ACCU-CHEK AVIVA PLUS) test strip USE TO CHECK BLOOD SUGAR ONCE TO TWICE DAILY 12/02/19   Burchette, Alinda Sierras, MD  losartan (COZAAR) 25 MG tablet Take 1 tablet (25 mg total) by mouth in the morning and at bedtime. 05/29/22   Larey Dresser, MD  methimazole (TAPAZOLE) 10 MG tablet Take 1 tablet (10 mg total) by mouth daily. Patient not taking: Reported on 06/20/2022 04/04/22   Rafael Bihari, FNP  mexiletine (MEXITIL) 150 MG capsule Take 1 capsule (150 mg total) by mouth 2 (two) times daily. Patient not taking: Reported on 06/20/2022 04/04/22   Rafael Bihari, FNP  Multiple Vitamin (MULTIVITAMIN WITH MINERALS) TABS tablet Take 1 tablet by mouth in the morning. Centrum Silver Adult 50+    [provider]  nitrofurantoin, macrocrystal-monohydrate, (MACROBID) 100 MG capsule Take 1 capsule (100 mg total) by mouth at bedtime. 07/01/22   Festus Aloe, MD  Omega-3 Fatty Acids (FISH OIL) 1000 MG CAPS Take 1,000 mg by  mouth 2 (two) times daily.     [provider]  rosuvastatin (CRESTOR) 20 MG tablet Take 20 mg by mouth in the morning. 05/15/22   [provider]  rosuvastatin (CRESTOR) 40 MG tablet Take 1 tablet (40 mg total) by mouth daily. Patient not taking: Reported on 06/20/2022 09/19/21   Larey Dresser, MD  rosuvastatin (CRESTOR) 40 MG tablet Take 1 tablet (40 mg total) by mouth daily. 06/19/22   Larey Dresser, MD  sodium zirconium cyclosilicate (LOKELMA) 5 g packet Take 5 g by mouth daily. Patient not taking: Reported on 06/20/2022 04/22/22   Rafael Bihari, FNP  spironolactone (ALDACTONE) 25 MG tablet Take 0.5 tablets (12.5 mg total) by mouth daily. Patient taking differently: Take 25 mg by mouth in the morning. 04/22/22   [place], Maricela Bo, FNP  tamsulosin (FLOMAX) 0.4 MG CAPS capsule Take 0.4 mg by mouth at bedtime. 02/17/19   [provider]  TRUEplus Lancets 30G MISC USE TO CHECK BLOOD SUGAR EVERY DAY TO TWICE DAILY 03/15/20   [name], MD    Physical Exam: Vitals:   11/16/22 0822 11/16/22 0830 11/16/22 0956  BP: 109/79  113/60  Pulse: 96  99  Resp: (!) 24  18  Temp: 98.5 F (36.9 C)    SpO2: 100%  97%  Weight:  101.2 kg   Height:  '5\' 8"'$  (1.727 m)    Physical Exam Vitals and nursing note reviewed.  Constitutional:      General: He is awake. He is not in acute distress.    Appearance: Normal appearance. He is obese.     Comments: Chronically ill-appearing.  HENT:     Head: Normocephalic.     Nose: No rhinorrhea.     Mouth/Throat:     Mouth: Mucous membranes are moist.  Eyes:     General: No scleral icterus.    Pupils: Pupils are equal, round, and reactive to light.  Neck:     Vascular: No JVD.  Cardiovascular:     Rate and Rhythm: Normal rate.     Heart sounds: S1 normal and S2 normal.  Pulmonary:     Effort: Pulmonary effort is normal.     Breath sounds: Normal breath sounds. No wheezing, rhonchi or rales.  Abdominal:     General: Bowel  sounds are normal.     Palpations: Abdomen is soft.     Tenderness: There is abdominal tenderness in the suprapubic area. There is no right CVA tenderness, left CVA tenderness, guarding or rebound.  Musculoskeletal:     Cervical back: Neck supple.     Right lower leg: No edema.     Left lower leg: No edema.  Skin:    General: Skin is warm and dry.  Neurological:     General: No focal deficit present.     Mental Status: He is alert and oriented to person, place, and time.  Psychiatric:        Behavior: Behavior is cooperative.   Data Reviewed:  There are no new results to review at this time.  Echocardiogram 06/27/2022 IMPRESSIONS:   1. Left ventricular ejection fraction, by estimation, is 20 to 25%. The  left ventricle has severely decreased function. The left ventricle  demonstrates global hypokinesis. The left ventricular internal cavity size  was moderately dilated. Left  ventricular diastolic parameters are consistent with Grade II diastolic  dysfunction (pseudonormalization).   2. Right ventricular systolic function is moderately reduced. The right  ventricular size is mildly enlarged. There is normal pulmonary artery  systolic pressure.   3. Left atrial size was moderately dilated.   4. The mitral valve is grossly normal. No evidence of mitral valve  regurgitation.   5. AV max velocity 1.6 m/s. mean ~7 mmHg, peak gradient 12 mmHg. No  significant aortic stenosis. The aortic valve is calcified. Aortic valve  regurgitation is not visualized.   6. The inferior vena cava is normal in size with greater than 50%  respiratory variability, suggesting right atrial pressure of 3 mmHg.   Assessment and Plan: Principal Problem:   Gross hematuria In the setting of:   Obstructive uropathy Had dysfunctional Foley catheter. Cystoscopy revealed erosion over prostatic urethra. Continuous irrigation of the bladder per urology. Continue ciprofloxacin 400 mg IVPB. Continue oxycodone  5 mg every 4 hours as needed. Continue tamsulosin 0.4 mg IVP.  Active Problems:   Hyperlipidemia Continue rosuvastatin 20 mg p.o. daily.    Essential hypertension Continue carvedilol 12.5 mg p.o. daily. Continue furosemide 20 mg every other day. Continue furosemide 40 mg every other day. Continue losartan 25 mg p.o. daily. Continue spironolactone 12.5 mg p.o. daily.    Systolic CHF, chronic (HCC) No signs of decompensation. Continue beta-blocker, ARB and diuretics as above. Follow-up with cardiology as an outpatient.    OSA (obstructive sleep apnea) Not on CPAP    CAD (coronary artery disease) Continue beta-blocker and statin.    CKD (chronic kidney disease) stage 3, GFR 30-59 ml/min (HCC) Monitor renal function electrolytes.    Paroxysmal atrial fibrillation (HCC) Hold Eliquis for now. Continue carvedilol for rate control.    Type 2 diabetes mellitus with hyperglycemia (HCC) Carbohydrate modified diet. No longer on Farxiga. CBG monitoring before meals and bedtime.     Advance Care Planning:   Code Status: Full code.  Consults: Ellison Hughs, MD (Urology).  Family Communication: His wife was at bedside.  Severity of Illness: The appropriate patient status for this patient is OBSERVATION. Observation status is judged to be reasonable and necessary in order to provide the required intensity of service to ensure the patient's safety. The patient's presenting symptoms,  physical exam findings, and initial radiographic and laboratory data in the context of their medical condition is felt to place them at decreased risk for further clinical deterioration. Furthermore, it is anticipated that the patient will be medically stable for discharge from the hospital within 2 midnights of admission.   Author: Reubin Milan, MD 11/16/2022 10:19 AM  For on call review [email or web address].   This document was prepared using Dragon voice recognition software and may contain some  unintended transcription errors.

## 2022-11-16 NOTE — Transfer of Care (Signed)
Immediate Anesthesia Transfer of Care Note  Patient: Mindi Slicker Mclaine  Procedure(s) Performed: [place]; CLOT EVACUATION  Patient Location: PACU  Anesthesia Type:General  Level of Consciousness: drowsy and patient cooperative  Airway & Oxygen Therapy: Patient Spontanous Breathing and Patient connected to face mask oxygen  Post-op Assessment: Report given to RN and Post -op Vital signs reviewed and stable  Post vital signs: Reviewed and stable  Last Vitals:  Vitals Value Taken Time  BP 124/65 11/16/22 1241  Temp 36.4 C 11/16/22 1241  Pulse 84 11/16/22 1245  Resp    SpO2 100 % 11/16/22 1245  Vitals shown include unvalidated device data.  Last Pain:  Vitals:   11/16/22 1241  PainSc: 0-No pain         Complications: No notable events documented.

## 2022-11-16 NOTE — ED Provider Notes (Signed)
[facility] EMERGENCY DEPARTMENT Provider Note   CSN: [id number] Arrival date & time: 11/16/22  0757     History  Chief Complaint  Patient presents with   Hematuria    Wayne [name] is a 81 y.o. male with history of hypertension, CHF (EF 20-25% as of July 2023), OSA, CAD, paroxysmal atrial fibrillation on Eliquis, diabetes, hyperlipidemia, prostate cancer and bladder cancer with chronic indwelling Foley catheter who presents the emergency department complaining of problems with his Foley.  Patient states for the past 4 days he has only been seeing clots and having to flush his Foley, only getting blood.  [facility]  Has had numerous issues with this Foley recently.  Has progressive disease shown on a PET scan on 11/29.     Hematuria       Home Medications Prior to Admission medications   Medication Sig Start Date End Date Taking? Authorizing Provider  bacitracin ointment Apply 1 Application topically 2 (two) times daily. Apply to penis tip 10/07/22   Elgie Congo, MD  carvedilol (COREG) 12.5 MG tablet TAKE 1 TABLET (12.5 MG TOTAL) BY MOUTH 2 (TWO) TIMES DAILY WITH A MEAL. 11/05/22   Clegg, Amy D, NP  dapagliflozin propanediol (FARXIGA) 10 MG TABS tablet Take 1 tablet (10 mg total) by mouth daily before breakfast. Patient not taking: Reported on 06/20/2022 04/04/22   Rafael Bihari, FNP  diphenhydramine-acetaminophen (TYLENOL PM) 25-500 MG TABS tablet Take 1 tablet at bedtime as needed by mouth (for sleep).     [provider]  doxycycline (VIBRAMYCIN) 100 MG capsule Take 1 capsule (100 mg total) by mouth 2 (two) times daily. 10/07/22   Elgie Congo, MD  ELIQUIS 5 MG TABS tablet TAKE 1 TABLET BY MOUTH TWICE A DAY 08/04/22   Larey Dresser, MD  furosemide (LASIX) 20 MG tablet Take '20mg'$  (1 tablet)by mouth daily alternating with '40mg'$  (2 tablets) every other day. Patient taking differently: Take 20 mg by mouth every  evening. 04/04/22   Milford, Maricela Bo, FNP  glucose blood (ACCU-CHEK AVIVA PLUS) test strip USE TO CHECK BLOOD SUGAR ONCE TO TWICE DAILY 12/02/19   Burchette, Alinda Sierras, MD  losartan (COZAAR) 25 MG tablet Take 1 tablet (25 mg total) by mouth in the morning and at bedtime. 05/29/22   Larey Dresser, MD  methimazole (TAPAZOLE) 10 MG tablet Take 1 tablet (10 mg total) by mouth daily. Patient not taking: Reported on 06/20/2022 04/04/22   Rafael Bihari, FNP  mexiletine (MEXITIL) 150 MG capsule Take 1 capsule (150 mg total) by mouth 2 (two) times daily. Patient not taking: Reported on 06/20/2022 04/04/22   Rafael Bihari, FNP  Multiple Vitamin (MULTIVITAMIN WITH MINERALS) TABS tablet Take 1 tablet by mouth in the morning. Centrum Silver Adult 50+    [provider]  nitrofurantoin, macrocrystal-monohydrate, (MACROBID) 100 MG capsule Take 1 capsule (100 mg total) by mouth at bedtime. 07/01/22   Festus Aloe, MD  Omega-3 Fatty Acids (FISH OIL) 1000 MG CAPS Take 1,000 mg by mouth 2 (two) times daily.     [provider]  rosuvastatin (CRESTOR) 20 MG tablet Take 20 mg by mouth in the morning. 05/15/22   [provider]  rosuvastatin (CRESTOR) 40 MG tablet Take 1 tablet (40 mg total) by mouth daily. Patient not taking: Reported on 06/20/2022 09/19/21   Larey Dresser, MD  rosuvastatin (CRESTOR) 40 MG tablet Take 1 tablet (40 mg total) by mouth  daily. 06/19/22   Larey Dresser, MD  sodium zirconium cyclosilicate (LOKELMA) 5 g packet Take 5 g by mouth daily. Patient not taking: Reported on 06/20/2022 04/22/22   Rafael Bihari, FNP  spironolactone (ALDACTONE) 25 MG tablet Take 0.5 tablets (12.5 mg total) by mouth daily. Patient taking differently: Take 25 mg by mouth in the morning. 04/22/22   Milford, Maricela Bo, FNP  tamsulosin (FLOMAX) 0.4 MG CAPS capsule Take 0.4 mg by mouth at bedtime. 02/17/19   [provider]  TRUEplus Lancets 30G MISC USE TO CHECK BLOOD SUGAR EVERY DAY  TO TWICE DAILY 03/15/20   Burchette, Alinda Sierras, MD      Allergies    Entresto [sacubitril-valsartan], Crestor [rosuvastatin calcium], Lipitor [atorvastatin], Plavix [clopidogrel], and Ancef [cefazolin]    Review of Systems   Review of Systems  Constitutional:  Negative for chills and fever.  Genitourinary:  Positive for hematuria and penile pain.  All other systems reviewed and are negative.   Physical Exam Updated Vital Signs BP 109/79 (BP Location: Right Arm)   Pulse 96   Temp 98.5 F (36.9 C)   Resp (!) 24   Ht '5\' 8"'$  (1.727 m)   Wt 101.2 kg   SpO2 100%   BMI 33.91 kg/m  Physical Exam Vitals and nursing note reviewed. Exam conducted with a chaperone present.  Constitutional:      Appearance: Normal appearance.  HENT:     Head: Normocephalic and atraumatic.  Eyes:     Conjunctiva/sclera: Conjunctivae normal.  Pulmonary:     Effort: Pulmonary effort is normal. No respiratory distress.  Genitourinary:    Comments: Foley catheter in place, no obvious signs of infection Skin:    General: Skin is warm and dry.  Neurological:     Mental Status: He is alert.  Psychiatric:        Mood and Affect: Mood normal.        Behavior: Behavior normal.     ED Results / Procedures / Treatments   Labs (all labs ordered are listed, but only abnormal results are displayed) Labs Reviewed  COMPREHENSIVE METABOLIC PANEL - Abnormal; Notable for the following components:      Result Value   CO2 20 (*)    Glucose, Bld 305 (*)    Creatinine, Ser 1.60 (*)    Albumin 3.4 (*)    GFR, Estimated 44 (*)    All other components within normal limits  CBC WITH DIFFERENTIAL/PLATELET  TYPE AND SCREEN    EKG None  Radiology No results found.  Procedures Procedures    Medications Ordered in ED Medications  lidocaine (XYLOCAINE) 2 % jelly 1 Application (1 Application Topical Given 11/16/22 0855)  fentaNYL (SUBLIMAZE) injection 50 mcg (50 mcg Intravenous Given 11/16/22 0855)    ED  Course/ Medical Decision Making/ A&P                           Medical Decision Making Amount and/or Complexity of Data Reviewed Labs: ordered.  Risk Prescription drug management. Decision regarding hospitalization.  This patient is a 81 y.o. male who presents to the ED for concern of hematuria and foley catheter issue.   Past Medical History / Social History / Additional history: Chart reviewed. Pertinent results include: hypertension, CHF (EF 20-25% as of July 2023), OSA, CAD, paroxysmal atrial fibrillation on Eliquis, diabetes, hyperlipidemia, prostate cancer and bladder cancer with chronic indwelling Foley catheter   Physical Exam: Physical exam  performed. The pertinent findings include: Foley catheter in place, no signs of infection. Numerous attempts made at flushing with significant clots and blood.   Medications / Treatment: Given pain medication and lidocaine for catheter placement. Nursing staff attempted flushing foley. Replaced with 20 coude catheter with long thick blood clots.   Consultations obtained: I consulted with on call urologist Dr Lovena Neighbours who recommended patient be admitted to hospitalist service and transferred to [facility]. Pre-op labs ordered, patient to be NPO and go straight to pre-op.   I consulted with hospitalist Dr Aul Mangieri [facility], the plan is to page [facility] once patient is in PACU after procedure.    Disposition: After consideration of the diagnostic results and the patients response to treatment, I feel that patient would benefit from clot clean out in the OR by urology and hospitalist admission.  I discussed this case with my attending physician Dr. Sherry Ruffing who cosigned this note including patient's presenting symptoms, physical exam, and planned diagnostics and interventions. Attending physician stated agreement with plan or made changes to plan which were implemented. EMTALA documentation completed.   Final Clinical Impression(s) / ED  Diagnoses Final diagnoses:  Problem with Foley catheter, initial encounter [facility])    Rx / DC Orders ED Discharge Orders     None      Portions of this report may have been transcribed using voice recognition software. Every effort was made to ensure accuracy; however, inadvertent computerized transcription errors may be present.    Estill Cotta 11/16/22 [id number]    Tegeler, Gwenyth Allegra, MD 11/16/22 [phone]

## 2022-11-16 NOTE — ED Notes (Signed)
Attempted calling short stay PACU OR  with no answer for report.

## 2022-11-16 NOTE — Anesthesia Preprocedure Evaluation (Addendum)
Anesthesia Evaluation  Patient identified by MRN, date of birth, ID band Patient awake    Reviewed: Allergy & Precautions, H&P , NPO status , Patient's Chart, lab work & pertinent test results  Airway Mallampati: II  TM Distance: >3 FB Neck ROM: Full    Dental no notable dental hx.    Pulmonary former smoker   Pulmonary exam normal breath sounds clear to auscultation       Cardiovascular hypertension, + CAD, + Past MI, + Cardiac Stents and +CHF  Normal cardiovascular exam+ Cardiac Defibrillator  Rhythm:Regular Rate:Normal  EF 20-25%   Neuro/Psych negative neurological ROS  negative psych ROS   GI/Hepatic negative GI ROS, Neg liver ROS,,,  Endo/Other  diabetes    Renal/GU negative Renal ROS  negative genitourinary   Musculoskeletal negative musculoskeletal ROS (+)    Abdominal   Peds negative pediatric ROS (+)  Hematology negative hematology ROS (+)   Anesthesia Other Findings   Reproductive/Obstetrics negative OB ROS                             Anesthesia Physical Anesthesia Plan  ASA: 4 and emergent  Anesthesia Plan: General   Post-op Pain Management: Minimal or no pain anticipated   Induction: Intravenous  PONV Risk Score and Plan: 2 and Ondansetron, Dexamethasone and Treatment may vary due to age or medical condition  Airway Management Planned: LMA  Additional Equipment:   Intra-op Plan:   Post-operative Plan: Extubation in OR  Informed Consent: I have reviewed the patients History and Physical, chart, labs and discussed the procedure including the risks, benefits and alternatives for the proposed anesthesia with the patient or authorized representative who has indicated his/her understanding and acceptance.     Dental advisory given  Plan Discussed with: CRNA and Surgeon  Anesthesia Plan Comments:        Anesthesia Quick Evaluation

## 2022-11-16 NOTE — Consult Note (Signed)
Urology Consult   Physician requesting consult: Tennis Must, MD  Reason for consult: Clot urinary retention  History of Present Illness: Wayne [name] is a 81 y.o. with a history of grade 1 prostate cancer (s/p brachytherapy in 2009), recurrent high grade T1 bladder cancer, urethral stricture disease requiring multiple dilations and recurrent UTIs. He is currently followed by Dr. Junious Silk for these GU issues.  The patient presented to the [facility] emergency department with gross hematuria draining from his indwelling Foley catheter and obstruction of the catheter.  The ER team attempted to irrigate his catheter and even exchange his catheter, but was unsuccessful in clearing him of clot.  Recent PET CT scan currently shows a posterior bladder wall mass concerning for either urothelial cell carcinoma recurrence or prostate cancer recurrence.  His last PSA was undetectable.  He is currently on Eliquis due to multiple cardiac issues.  Past Medical History:  Diagnosis Date   AICD (automatic cardioverter/defibrillator) present    Arthritis    Bladder cancer ([place])    Cancer ([place])    bladder and prostate   Cellulitis of left leg    a. 12/5398 complicated by septic shock   Chronic systolic CHF (congestive heart failure) (HCC)    CKD (chronic kidney disease), stage III ([place])    pt denies   Colon polyps    CORONARY ATHEROSCLEROSIS NATIVE CORONARY ARTERY cardiologist-  dr Aundra Dubin   a. 01/2011 Cath/PCI: LM nl, LAD 40-50p, D1 80-small, LCX 95-small, RI 90, RCA 100, EF 20%;  b. 01/2011 Card MRI - No transmural scar;  c. 01/2011 PCI RCA->5 Promus DES, RI->3.0x16 Promus DES; d. Cath 01/13/13 patent LAD & Ramus, diffuse LCx dz, RCA mult overlapping stents w/ 95% osital stenosis, EF 20%, s/p DES to ostial/prox RCA 01/24/13    Diabetes mellitus, type 2 ([place])    type 2   Hematoma of leg    a. left leg hematoma 03/2012 in the setting of asa/effient   Herpes zoster ophthalmicus    neuropathy   History of anemia     History of kidney stones    History of non-ST elevation myocardial infarction (NSTEMI) 06/2012   History of prostate cancer    dx 2009---  s/p radioactive seed implants 11-24-2008   HYPERLIPIDEMIA    intolerant to Lipitor (myalgias)   HYPERTENSION    Ischemic cardiomyopathy    a. 01/2012 Echo EF 45%, mild LVH; b. QQ76%, grade 1 diastolic dysfunction, diffuse hypokinesis, inferoposterior akinesis    Myocardial infarction ([place])    x2   Noncompliance    Obesity    OSA (obstructive sleep apnea)    dx 2012  severe osa  no cpap just uses nose strips   PAF (paroxysmal atrial fibrillation) ([place])    first dx 01-16-2017   Peripheral neuropathy    Polymyalgia rheumatica ([place])    Prostate cancer ([place])    S/P drug eluting coronary stent placement    total 6 DES    Past Surgical History:  Procedure Laterality Date   CARDIAC CATHETERIZATION  01/24/2013   CARDIAC CATHETERIZATION N/A 11/14/2016   Procedure: Right/Left Heart Cath and Coronary Angiography;  Surgeon: Larey Dresser, MD;  Location: [place] [facility];  Service: Cardiovascular;  Laterality: N/A;   CARDIAC CATHETERIZATION N/A 11/17/2016   Procedure: Coronary Stent Intervention w/Impella;  Surgeon: Peter M Martinique, MD;  Location: [place] [facility];  Service: Cardiovascular;  Laterality: N/A;   CARDIAC CATHETERIZATION N/A 11/17/2016   Procedure: Coronary Atherectomy;  Surgeon: Peter M Martinique, MD;  Location: [place] [facility];  Service: Cardiovascular;  Laterality: N/A;   CORONARY ANGIOPLASTY WITH STENT PLACEMENT  01-30-2011   dr Aundra Dubin   PTCA and multiple overlapping DEStenting to RCA and 1 DES to RI    CORONARY BALLOON ANGIOPLASTY N/A 07/06/2017   Procedure: Coronary Balloon Angioplasty;  Surgeon: Sherren Mocha, MD;  Location: [place] [facility];  Service: Cardiovascular;  Laterality: N/A;   CYSTOSCOPY WITH FULGERATION N/A 05/25/2018   Procedure: CYSTOSCOPY WITH FULGERATION/BLADDER BIOPSY , 15 mm lesion;  Surgeon: Festus Aloe, MD;   Location: [facility];  Service: Urology;  Laterality: N/A;   CYSTOSCOPY WITH URETHRAL DILATATION N/A 08/09/2021   Procedure: CYSTOSCOPY WITH URETHRAL DILATATION WITH OPTILUME;  Surgeon: Festus Aloe, MD;  Location: [facility];  Service: Urology;  Laterality: N/A;   CYSTOSCOPY WITH URETHRAL DILATATION N/A 07/01/2022   Procedure: CYSTOSCOPY WITH BALLOON DILATATION OF PROSTATIC URETHRA;  Surgeon: Festus Aloe, MD;  Location: [facility];  Service: Urology;  Laterality: N/A;   I & D EXTREMITY  06/15/2012   Procedure: IRRIGATION AND DEBRIDEMENT EXTREMITY;  Surgeon: Newt Minion, MD;  Location: [place];  Service: Orthopedics;  Laterality: Left;  I&D Left Posterior Knee   I & D EXTREMITY  06/30/2012   Procedure: IRRIGATION AND DEBRIDEMENT EXTREMITY;  Surgeon: Newt Minion, MD;  Location: [place];  Service: Orthopedics;  Laterality: Left;  Left Leg Irrigation and Debridement and placement of Wound VAC and application of  A-cell   I & D EXTREMITY  07/20/2012   Procedure: IRRIGATION AND DEBRIDEMENT EXTREMITY;  Surgeon: Newt Minion, MD;  Location: [place];  Service: Orthopedics;  Laterality: Left;  Irrigation and Debridement Left Leg and Place antibiotic beads    ICD IMPLANT N/A 02/12/2017   Procedure: ICD Implant;  Surgeon: Evans Lance, MD;  Location: [place] [facility];  Service: Cardiovascular;  Laterality: N/A;   LAPAROSCOPIC INGUINAL HERNIA REPAIR Right 09-11-2008   dr Barry Dienes  [facility]   LEAD REVISION/REPAIR N/A 05/26/2017   Procedure: Lead Revision/Repair;  Surgeon: Evans Lance, MD;  Location: [place] [facility];  Service: Cardiovascular;  Laterality: N/A;   LEFT HEART CATH AND CORONARY ANGIOGRAPHY N/A 07/06/2017   Procedure: Left Heart Cath and Coronary Angiography;  Surgeon: Larey Dresser, MD;  Location: [place] [facility];  Service: Cardiovascular;  Laterality: N/A;   PERCUTANEOUS CORONARY STENT INTERVENTION (PCI-S) N/A 01/24/2013   Procedure: PERCUTANEOUS CORONARY STENT INTERVENTION (PCI-S);  Surgeon:  Wellington [place], MD;  Location: [facility] CATH LAB;  Service: Cardiovascular;  Laterality: N/A;   RADIOACTIVE PROSTATE SEED IMPLANTS  11-24-2008   dr Tresa Endo  [place] TUMOR N/A 10/30/2017   Procedure: TRANSURETHRAL RESECTION OF BLADDER TUMOR (TURBT)/ INSTILLATION OF EPIRUBICIN;  Surgeon: Festus Aloe, MD;  [facility];  Service: Urology;  Laterality: N/A;   TRANSURETHRAL RESECTION OF BLADDER TUMOR N/A 05/18/2020   Procedure: TRANSURETHRAL RESECTION OF BLADDER TUMOR (TURBT) 0.5-2.0 cm BILATERAL RETROGRADE PYELOGRAM;  Surgeon: Festus Aloe, MD;  [facility];  Service: Urology;  Laterality: N/A;   ULTRASOUND GUIDANCE FOR VASCULAR ACCESS  11/14/2016   Procedure: Ultrasound Guidance For Vascular Access;  Surgeon: Larey Dresser, MD;  Location: [place] [facility];  Service: Cardiovascular;;   V TACH ABLATION N/A 06/19/2017   Procedure: V Tach Ablation;  Surgeon: Evans Lance, MD;  Location: [place] [facility];  Service: Cardiovascular;  Laterality: N/A;    Current Hospital  Medications:  Home Meds:  No outpatient medications have been marked as taking for the 11/16/22 encounter [facility] Encounter).    Scheduled Meds: Continuous Infusions: PRN Meds:.  Allergies:  Allergies  Allergen Reactions   Entresto [Sacubitril-Valsartan] Swelling and Other (See Comments)    Angioedema   Crestor [Rosuvastatin Calcium] Other (See Comments)    Myalgia, Interfering with Gait in high doses   Lipitor [Atorvastatin] Other (See Comments)    Makes legs sore   Plavix [Clopidogrel] Hives, Itching, Swelling and Other (See Comments)    Nose bleeds and welts on legs & back   Ancef [Cefazolin] Itching, Rash and Other (See Comments)    Describes itching and rash, but said that "it wasn't that bad"    Family History  Problem Relation Age of Onset   Cancer Mother 47       unknown CA   Heart disease Father    Alcohol abuse Father    Heart attack Father 14    Heart attack Brother     Social History:  reports that he quit smoking about 33 years ago. His smoking use included cigarettes. He has a 6.00 pack-year smoking history. He has never used smokeless tobacco. He reports that he does not drink alcohol and does not use drugs.  ROS: A complete review of systems was performed.  All systems are negative except for pertinent findings as noted.  Physical Exam:  Vital signs in last 24 hours: Temp:  [98.5 F (36.9 C)] 98.5 F (36.9 C) (12/03 0822) Pulse Rate:  [96-99] 99 (12/03 0956) Resp:  [18-24] 18 (12/03 0956) BP: (109-113)/(60-79) 113/60 (12/03 0956) SpO2:  [97 %-100 %] 97 % (12/03 0956) Weight:  [101.2 kg] 101.2 kg (12/03 0830) Constitutional:  Alert and oriented, No acute distress Cardiovascular: Regular rate and rhythm, No JVD Respiratory: Normal respiratory effort, Lungs clear bilaterally GI: Abdomen is soft, nontender, nondistended, no abdominal masses GU: 20 French Foley catheter in place and draining grossly bloody urine with a large amount of clot Laboratory Data:  Recent Labs    11/16/22 0832  WBC 9.9  HGB 13.8  HCT 43.2  PLT 215    Recent Labs    11/16/22 0832  NA 135  K 4.8  CL 105  GLUCOSE 305*  BUN 19  CALCIUM 9.3  CREATININE 1.60*     Results for orders placed or performed during the hospital encounter of 11/16/22 (from the past 24 hour(s))  CBC with Differential     Status: None   Collection Time: 11/16/22  8:32 AM  Result Value Ref Range   WBC 9.9 4.0 - 10.5 K/uL   RBC 4.81 4.22 - 5.81 MIL/uL   Hemoglobin 13.8 13.0 - 17.0 g/dL   HCT 43.2 39.0 - 52.0 %   MCV 89.8 80.0 - 100.0 fL   MCH 28.7 26.0 - 34.0 pg   MCHC 31.9 30.0 - 36.0 g/dL   RDW 12.9 11.5 - 15.5 %   Platelets 215 150 - 400 K/uL   nRBC 0.0 0.0 - 0.2 %   Neutrophils Relative % 57 %   Neutro Abs 5.6 1.7 - 7.7 K/uL   Lymphocytes Relative 30 %   Lymphs Abs 2.9 0.7 - 4.0 K/uL   Monocytes Relative 10 %   Monocytes Absolute 1.0 0.1 - 1.0 K/uL    Eosinophils Relative 2 %   Eosinophils Absolute 0.2 0.0 - 0.5 K/uL   Basophils Relative 1 %   Basophils Absolute 0.1 0.0 - 0.1 K/uL  Immature Granulocytes 0 %   Abs Immature Granulocytes 0.04 0.00 - 0.07 K/uL  Comprehensive metabolic panel     Status: Abnormal   Collection Time: 11/16/22  8:32 AM  Result Value Ref Range   Sodium 135 135 - 145 mmol/L   Potassium 4.8 3.5 - 5.1 mmol/L   Chloride 105 98 - 111 mmol/L   CO2 20 (L) 22 - 32 mmol/L   Glucose, Bld 305 (H) 70 - 99 mg/dL   BUN 19 8 - 23 mg/dL   Creatinine, Ser 1.60 (H) 0.61 - 1.24 mg/dL   Calcium 9.3 8.9 - 10.3 mg/dL   Total Protein 6.7 6.5 - 8.1 g/dL   Albumin 3.4 (L) 3.5 - 5.0 g/dL   AST 27 15 - 41 U/L   ALT 17 0 - 44 U/L   Alkaline Phosphatase 68 38 - 126 U/L   Total Bilirubin 0.6 0.3 - 1.2 mg/dL   GFR, Estimated 44 (L) >60 mL/min   Anion gap 10 5 - 15  Type and screen [place]     Status: None   Collection Time: 11/16/22  8:45 AM  Result Value Ref Range   ABO/RH(D) O POS    Antibody Screen NEG    Sample Expiration      11/19/2022,2359 Performed at [facility], [address] [place], [place] [address]    *Note: Due to a large number of results and/or encounters for the requested time period, some results have not been displayed. A complete set of results can be found in Results Review.   No results found for this or any previous visit (from the past 240 hour(s)).  Renal Function: Recent Labs    11/16/22 1941  CREATININE 1.60*   Estimated Creatinine Clearance: 43.2 mL/min (A) (by C-G formula based on SCr of 1.6 mg/dL (H)).  Radiologic Imaging: No results found.  I independently reviewed the above imaging studies.  Impression/Recommendation 81 year old male with a history of recurrent high-grade T1 bladder cancer and prostate cancer who presents with clot urinary retention  -The risk, benefits and alternatives of cystoscopy with clot evacuation and fulguration was discussed  in detail.  Risk include, but not limited to, bleeding, urinary tract infection, urethral stricture disease, MI, CVA, DVT and the inherent risks of general anesthesia.  Given that he is on Eliquis, I will defer any significant biopsy at this time.  Ellison Hughs, MD [facility] 11/16/2022, 11:18 AM

## 2022-11-17 ENCOUNTER — Encounter (HOSPITAL_COMMUNITY): Payer: Self-pay | Admitting: Urology

## 2022-11-17 DIAGNOSIS — N179 Acute kidney failure, unspecified: Secondary | ICD-10-CM | POA: Diagnosis not present

## 2022-11-17 DIAGNOSIS — T839XXA Unspecified complication of genitourinary prosthetic device, implant and graft, initial encounter: Secondary | ICD-10-CM | POA: Diagnosis not present

## 2022-11-17 DIAGNOSIS — R42 Dizziness and giddiness: Secondary | ICD-10-CM | POA: Diagnosis not present

## 2022-11-17 DIAGNOSIS — E1149 Type 2 diabetes mellitus with other diabetic neurological complication: Secondary | ICD-10-CM | POA: Diagnosis not present

## 2022-11-17 DIAGNOSIS — D630 Anemia in neoplastic disease: Secondary | ICD-10-CM | POA: Diagnosis not present

## 2022-11-17 DIAGNOSIS — I5022 Chronic systolic (congestive) heart failure: Secondary | ICD-10-CM | POA: Diagnosis not present

## 2022-11-17 DIAGNOSIS — E1122 Type 2 diabetes mellitus with diabetic chronic kidney disease: Secondary | ICD-10-CM | POA: Diagnosis present

## 2022-11-17 DIAGNOSIS — I130 Hypertensive heart and chronic kidney disease with heart failure and stage 1 through stage 4 chronic kidney disease, or unspecified chronic kidney disease: Secondary | ICD-10-CM | POA: Diagnosis present

## 2022-11-17 DIAGNOSIS — I10 Essential (primary) hypertension: Secondary | ICD-10-CM | POA: Diagnosis not present

## 2022-11-17 DIAGNOSIS — I4729 Other ventricular tachycardia: Secondary | ICD-10-CM | POA: Diagnosis not present

## 2022-11-17 DIAGNOSIS — C679 Malignant neoplasm of bladder, unspecified: Secondary | ICD-10-CM | POA: Diagnosis present

## 2022-11-17 DIAGNOSIS — C9110 Chronic lymphocytic leukemia of B-cell type not having achieved remission: Secondary | ICD-10-CM | POA: Diagnosis present

## 2022-11-17 DIAGNOSIS — I5042 Chronic combined systolic (congestive) and diastolic (congestive) heart failure: Secondary | ICD-10-CM | POA: Diagnosis present

## 2022-11-17 DIAGNOSIS — R52 Pain, unspecified: Secondary | ICD-10-CM | POA: Diagnosis not present

## 2022-11-17 DIAGNOSIS — B3749 Other urogenital candidiasis: Secondary | ICD-10-CM | POA: Diagnosis present

## 2022-11-17 DIAGNOSIS — D62 Acute posthemorrhagic anemia: Secondary | ICD-10-CM | POA: Diagnosis not present

## 2022-11-17 DIAGNOSIS — R7881 Bacteremia: Secondary | ICD-10-CM | POA: Diagnosis not present

## 2022-11-17 DIAGNOSIS — D400 Neoplasm of uncertain behavior of prostate: Secondary | ICD-10-CM | POA: Diagnosis not present

## 2022-11-17 DIAGNOSIS — C61 Malignant neoplasm of prostate: Secondary | ICD-10-CM | POA: Diagnosis present

## 2022-11-17 DIAGNOSIS — G4733 Obstructive sleep apnea (adult) (pediatric): Secondary | ICD-10-CM | POA: Diagnosis not present

## 2022-11-17 DIAGNOSIS — I2510 Atherosclerotic heart disease of native coronary artery without angina pectoris: Secondary | ICD-10-CM | POA: Diagnosis not present

## 2022-11-17 DIAGNOSIS — D414 Neoplasm of uncertain behavior of bladder: Secondary | ICD-10-CM | POA: Diagnosis not present

## 2022-11-17 DIAGNOSIS — C674 Malignant neoplasm of posterior wall of bladder: Secondary | ICD-10-CM | POA: Diagnosis not present

## 2022-11-17 DIAGNOSIS — E785 Hyperlipidemia, unspecified: Secondary | ICD-10-CM | POA: Diagnosis not present

## 2022-11-17 DIAGNOSIS — Y738 Miscellaneous gastroenterology and urology devices associated with adverse incidents, not elsewhere classified: Secondary | ICD-10-CM | POA: Diagnosis present

## 2022-11-17 DIAGNOSIS — I509 Heart failure, unspecified: Secondary | ICD-10-CM | POA: Diagnosis not present

## 2022-11-17 DIAGNOSIS — M353 Polymyalgia rheumatica: Secondary | ICD-10-CM | POA: Diagnosis present

## 2022-11-17 DIAGNOSIS — Z66 Do not resuscitate: Secondary | ICD-10-CM | POA: Diagnosis present

## 2022-11-17 DIAGNOSIS — R590 Localized enlarged lymph nodes: Secondary | ICD-10-CM | POA: Diagnosis not present

## 2022-11-17 DIAGNOSIS — E1140 Type 2 diabetes mellitus with diabetic neuropathy, unspecified: Secondary | ICD-10-CM | POA: Diagnosis present

## 2022-11-17 DIAGNOSIS — M7989 Other specified soft tissue disorders: Secondary | ICD-10-CM | POA: Diagnosis not present

## 2022-11-17 DIAGNOSIS — I110 Hypertensive heart disease with heart failure: Secondary | ICD-10-CM | POA: Diagnosis not present

## 2022-11-17 DIAGNOSIS — K5900 Constipation, unspecified: Secondary | ICD-10-CM | POA: Diagnosis not present

## 2022-11-17 DIAGNOSIS — E669 Obesity, unspecified: Secondary | ICD-10-CM | POA: Diagnosis present

## 2022-11-17 DIAGNOSIS — F05 Delirium due to known physiological condition: Secondary | ICD-10-CM | POA: Diagnosis not present

## 2022-11-17 DIAGNOSIS — C799 Secondary malignant neoplasm of unspecified site: Secondary | ICD-10-CM | POA: Diagnosis present

## 2022-11-17 DIAGNOSIS — I472 Ventricular tachycardia, unspecified: Secondary | ICD-10-CM | POA: Diagnosis present

## 2022-11-17 DIAGNOSIS — K8020 Calculus of gallbladder without cholecystitis without obstruction: Secondary | ICD-10-CM | POA: Diagnosis not present

## 2022-11-17 DIAGNOSIS — N139 Obstructive and reflux uropathy, unspecified: Secondary | ICD-10-CM | POA: Diagnosis present

## 2022-11-17 DIAGNOSIS — E1165 Type 2 diabetes mellitus with hyperglycemia: Secondary | ICD-10-CM | POA: Diagnosis present

## 2022-11-17 DIAGNOSIS — Z7189 Other specified counseling: Secondary | ICD-10-CM | POA: Diagnosis not present

## 2022-11-17 DIAGNOSIS — I480 Paroxysmal atrial fibrillation: Secondary | ICD-10-CM | POA: Diagnosis present

## 2022-11-17 DIAGNOSIS — Z515 Encounter for palliative care: Secondary | ICD-10-CM | POA: Diagnosis not present

## 2022-11-17 DIAGNOSIS — C8590 Non-Hodgkin lymphoma, unspecified, unspecified site: Secondary | ICD-10-CM | POA: Diagnosis present

## 2022-11-17 DIAGNOSIS — I959 Hypotension, unspecified: Secondary | ICD-10-CM | POA: Diagnosis not present

## 2022-11-17 DIAGNOSIS — L03011 Cellulitis of right finger: Secondary | ICD-10-CM | POA: Diagnosis not present

## 2022-11-17 DIAGNOSIS — N390 Urinary tract infection, site not specified: Secondary | ICD-10-CM | POA: Diagnosis not present

## 2022-11-17 DIAGNOSIS — N1831 Chronic kidney disease, stage 3a: Secondary | ICD-10-CM | POA: Diagnosis present

## 2022-11-17 DIAGNOSIS — D4110 Neoplasm of uncertain behavior of unspecified renal pelvis: Secondary | ICD-10-CM | POA: Diagnosis not present

## 2022-11-17 DIAGNOSIS — M19041 Primary osteoarthritis, right hand: Secondary | ICD-10-CM | POA: Diagnosis not present

## 2022-11-17 DIAGNOSIS — Z87891 Personal history of nicotine dependence: Secondary | ICD-10-CM | POA: Diagnosis not present

## 2022-11-17 DIAGNOSIS — R310 Gross hematuria: Secondary | ICD-10-CM | POA: Diagnosis not present

## 2022-11-17 DIAGNOSIS — R443 Hallucinations, unspecified: Secondary | ICD-10-CM | POA: Diagnosis not present

## 2022-11-17 DIAGNOSIS — C801 Malignant (primary) neoplasm, unspecified: Secondary | ICD-10-CM | POA: Diagnosis not present

## 2022-11-17 DIAGNOSIS — I700 Atherosclerosis of aorta: Secondary | ICD-10-CM | POA: Diagnosis present

## 2022-11-17 DIAGNOSIS — J690 Pneumonitis due to inhalation of food and vomit: Secondary | ICD-10-CM | POA: Diagnosis not present

## 2022-11-17 LAB — GLUCOSE, CAPILLARY
Glucose-Capillary: 299 mg/dL — ABNORMAL HIGH (ref 70–99)
Glucose-Capillary: 314 mg/dL — ABNORMAL HIGH (ref 70–99)
Glucose-Capillary: 224 mg/dL — ABNORMAL HIGH (ref 70–99)
Glucose-Capillary: 228 mg/dL — ABNORMAL HIGH (ref 70–99)

## 2022-11-17 LAB — CBC
HCT: 40.5 % (ref 39.0–52.0)
Hemoglobin: 12.1 g/dL — ABNORMAL LOW (ref 13.0–17.0)
MCH: 28.3 pg (ref 26.0–34.0)
MCHC: 29.9 g/dL — ABNORMAL LOW (ref 30.0–36.0)
MCV: 94.8 fL (ref 80.0–100.0)
Platelets: 168 10*3/uL (ref 150–400)
RBC: 4.27 MIL/uL (ref 4.22–5.81)
RDW: 13.1 % (ref 11.5–15.5)
WBC: 10.5 10*3/uL (ref 4.0–10.5)
nRBC: 0 % (ref 0.0–0.2)

## 2022-11-17 LAB — COMPREHENSIVE METABOLIC PANEL
ALT: 14 U/L (ref 0–44)
AST: 17 U/L (ref 15–41)
Albumin: 3 g/dL — ABNORMAL LOW (ref 3.5–5.0)
Alkaline Phosphatase: 61 U/L (ref 38–126)
Anion gap: 8 (ref 5–15)
BUN: 23 mg/dL (ref 8–23)
CO2: 21 mmol/L — ABNORMAL LOW (ref 22–32)
Calcium: 8.6 mg/dL — ABNORMAL LOW (ref 8.9–10.3)
Chloride: 102 mmol/L (ref 98–111)
Creatinine, Ser: 1.24 mg/dL (ref 0.61–1.24)
GFR, Estimated: 59 mL/min — ABNORMAL LOW (ref >60)
Glucose, Bld: 376 mg/dL — ABNORMAL HIGH (ref 70–99)
Potassium: 5.5 mmol/L — ABNORMAL HIGH (ref 3.5–5.1)
Sodium: 131 mmol/L — ABNORMAL LOW (ref 135–145)
Total Bilirubin: 0.7 mg/dL (ref 0.3–1.2)
Total Protein: 6 g/dL — ABNORMAL LOW (ref 6.5–8.1)

## 2022-11-17 MED ORDER — INSULIN GLARGINE-YFGN 100 UNIT/ML ~~LOC~~ SOLN
5.0000 [IU] | Freq: Every day | SUBCUTANEOUS | Status: DC
  Administered 2022-11-17: 5 [IU] via SUBCUTANEOUS
  Filled 2022-11-17: qty 0.05

## 2022-11-17 MED ORDER — SODIUM CHLORIDE 0.9 % IV SOLN
INTRAVENOUS | Status: DC | PRN
  Administered 2022-11-17: 250 mL via INTRAVENOUS

## 2022-11-17 MED ORDER — SODIUM ZIRCONIUM CYCLOSILICATE 5 G PO PACK
5.0000 g | PACK | Freq: Once | ORAL | Status: AC
  Administered 2022-11-17: 5 g via ORAL
  Filled 2022-11-17: qty 1

## 2022-11-17 MED ORDER — INSULIN GLARGINE-YFGN 100 UNIT/ML ~~LOC~~ SOLN
20.0000 [IU] | Freq: Every day | SUBCUTANEOUS | Status: DC
  Administered 2022-11-18 – 2022-11-19 (×2): 20 [IU] via SUBCUTANEOUS
  Filled 2022-11-17 (×2): qty 0.2

## 2022-11-17 NOTE — Progress Notes (Signed)
Progress Note   Patient: Wayne [name] LPF:[id number] DOB: 11-13-1941 DOA: 11/16/2022     0 DOS: the patient was seen and examined on 11/17/2022   Brief hospital course: 81 y.o. male with medical history significant of cardiac arrest, AICD placement, coronary arthrosclerosis, history of NSTEMI, drug-eluting stent placement, paroxysmal atrial fibrillation on apixaban, hypertension, chronic combined systolic and diastolic heart failure, osteoarthritis, cellulitis of the left lower extremity, history of anemia, history of herpes zoster ophthalmicus, noncompliance, obesity, OSA not on CPAP, peripheral neuropathy, type 2 diabetes, polymyalgia rheumatica, urolithiasis, bladder cancer, prostate cancer, with history of brachytherapy in 2009, recurrent high-grade T1 bladder cancer, urethral stricture requiring multiple dilatations and recurring UTIs who presented to [facility] emergency department with gross hematuria and Foley catheter dysfunction.  He has been seen hematuria and has had catheter dysfunction for the past few days.  While he was in the ER, attempts to to irrigate his catheter and even exchange his catheter were performed, but these measures were unsuccessful relieving the obstruction and clotted blood.  His last PSA was undetectable.  Recent PET CT scan showed a posterior bladder wall mass concerning for either bladder or prostate CA recurrence. He has had suprapubic pain, but no flank pain or hematuria.  Denied fever, chills, rhinorrhea, sore throat, wheezing or hemoptysis.  No chest pain, palpitations, diaphoresis, PND, orthopnea, but occasionally develops pitting edema of the lower extremities.  No abdominal pain, nausea, emesis, diarrhea, constipation, melena or hematochezia. No polyuria, polydipsia, polyphagia or blurred vision.    ED course: Initial vital signs were temperature 98.5 F, pulse 96, respiration 24, BP 109/79 mmHg O2 sat 100% on room air.  The patient received fentanyl 50 mcg IVP and  2% lidocaine jelly was used during Foley exchange attempt.  While in PACU, the patient received oxycodone 5 mg p.o. x 2 and continuous bladder irrigation was initiated  Assessment and Plan: Principal Problem:   Gross hematuria In the setting of:   Obstructive uropathy -Urology following -Noted to be related to prostatic bleed -eliquis on hold -underwent CBI, now stopped per Urology -Discussed with Urology. Pt noted to have progressive abdominal adenopathy. Recommendation for biopsy. IR consulted for biopsy per Urology -Cont on cipro   Active Problems:   Hyperlipidemia Continue rosuvastatin 20 mg p.o. daily.     Essential hypertension Continue carvedilol 12.5 mg p.o. daily. Continue furosemide 20 alternating with '40mg'$  every other day    Systolic CHF, chronic (HCC) No signs of decompensation. Continue beta-blocker, ARB and diuretics as above. Follow-up with cardiology as an outpatient.     OSA (obstructive sleep apnea) Not on CPAP     CAD (coronary artery disease) Continue beta-blocker and statin.     CKD (chronic kidney disease) stage 3, GFR 30-59 ml/min (HCC) Monitor renal function electrolytes.     Paroxysmal atrial fibrillation (HCC) Hold Eliquis for now. Continue carvedilol for rate control.     Type 2 diabetes mellitus with hyperglycemia (HCC) Carbohydrate modified diet. No longer on Farxiga. Glucose trends are uncontrolled, up to 300's  Have started semglee 20 units A1c pending  Hyperkalemia -Will give dose of lokelma -Repeat bmet in AM     Subjective: Without complaints  Physical Exam: Vitals:   11/16/22 2236 11/17/22 0205 11/17/22 0506 11/17/22 1229  BP: 108/60 (!) 101/52 115/82 (!) 102/55  Pulse: 82 78 80 73  Resp: '18 17 17 18  '$ Temp: 98.1 F (36.7 C) 98.3 F (36.8 C) 98.3 F (36.8 C) 98.1 F (36.7 C)  TempSrc: Oral Oral Oral Oral  SpO2: 95% 93% 95% 92%  Weight:      Height:       General exam: Awake, laying in bed, in nad Respiratory  system: Normal respiratory effort, no wheezing Cardiovascular system: regular rate, s1, s2 Gastrointestinal system: Soft, nondistended, positive BS Central nervous system: CN2-12 grossly intact, strength intact Extremities: Perfused, no clubbing Skin: Normal skin turgor, no notable skin lesions seen Psychiatry: Mood normal // no visual hallucinations   Data Reviewed:  Labs reviewed: Na 131, K 5.5, Cr 1.24  Family Communication: Pt in room, family not at bedside  Disposition: Status is: Inpatient Remains inpatient appropriate because: Severity of illness  Planned Discharge Destination: Home    Author: Marylu Lund, MD 11/17/2022 6:23 PM  For on call review [email or web address].

## 2022-11-17 NOTE — Progress Notes (Signed)
  Transition of Care [facility]) Screening Note   Patient Details  Name: Wayne [name] Date of Birth: 27-Sep-1941   Transition of Care [facility]) CM/SW Contact:    Dessa Phi, RN Phone Number: 11/17/2022, 11:59 AM    Transition of Care Department [facility]) has reviewed patient and no TOC needs have been identified at this time. We will continue to monitor patient advancement through interdisciplinary progression rounds. If new patient transition needs arise, please place a TOC consult.

## 2022-11-17 NOTE — Hospital Course (Addendum)
81 y.o. PMH of CAD SP PCI, PAF on Eliquis, HTN, chronic combined CHF, OSA, neuropathy, type II DM, PMR, prostate cancer SP brachytherapy, bladder cancer, AICD implant. Presented to hospital with complaints of hematuria and catheter dysfunction. Urology following.  Developed hospital induced delirium. 12/3 cystoscopy with clot evacuation and fulguration.  Initiated on CBI.  Started on Cipro. 12/5 CT-guided biopsy of left iliac and axillary lymph nodes. 12/8 started on fluconazole for yeast in urine 12/12 TURP, TURBT, bilateral pyelogram and ureteral stent placement.  Started on IV antibiotic for pneumonia 12/14 found to have left thumb paronychia doxycycline was added.  Underwent bedside debridement with hand surgery 12/15 biopsies came back positive for small cell metastatic prostate carcinoma.  Medical oncology was consulted.  Foley catheter was removed. 12/16 Foley catheter reinserted due to retention. 12/17 patient decided to transition to DNR/DNI from full code had frequent PVCs on telemetry.  Wound cultures came back positive from the thumb for Candida as well.  Workup for candidemia initiated.  Discussed with ID.  Recommend to continue oral fluconazole. 12/18 discussed with [name] from cardiology due to NSVT, initiated on mexiletine.  Palliative care consulted 12/19 echocardiogram shows evidence of aortic valve thickening.  Will require ID involvement based on goals of care conversation after [name] discussion with the family.

## 2022-11-17 NOTE — Plan of Care (Signed)
  Problem: Education: Goal: Knowledge of General Education information will improve Description: Including pain rating scale, medication(s)/side effects and non-pharmacologic comfort measures Outcome: Completed/Met   Problem: Activity: Goal: Risk for activity intolerance will decrease Outcome: Progressing   

## 2022-11-17 NOTE — Progress Notes (Signed)
1 Day Post-Op Subjective: Patient reports no specific complaints.  Objective: Vital signs in last 24 hours: Temp:  [97.6 F (36.4 C)-98.3 F (36.8 C)] 98.3 F (36.8 C) (12/04 0506) Pulse Rate:  [78-99] 80 (12/04 0506) Resp:  [17-19] 17 (12/04 0506) BP: (98-124)/(52-82) 115/82 (12/04 0506) SpO2:  [93 %-100 %] 95 % (12/04 0506) Weight:  [99.8 kg] 99.8 kg (12/03 1433)  Intake/Output from previous day: 12/03 0701 - 12/04 0700 In: 2470 [P.O.:420; I.V.:400; IV Piggyback:500] Out: 4100 [Urine:4100] Intake/Output this shift: No intake/output data recorded.  Physical Exam:  Alert and oriented Abdomen soft and nontender Hematuria catheter in place-urine light pink to red with CBI clamped.  I turned CBI on and urine immediately cleared.  Lab Results: Recent Labs    11/16/22 0832 11/17/22 0359  HGB 13.8 12.1*  HCT 43.2 40.5   BMET Recent Labs    11/16/22 0832 11/17/22 0359  NA 135 131*  K 4.8 5.5*  CL 105 102  CO2 20* 21*  GLUCOSE 305* 376*  BUN 19 23  CREATININE 1.60* 1.24  CALCIUM 9.3 8.6*   No results for input(s): "LABPT", "INR" in the last 72 hours. No results for input(s): "LABURIN" in the last 72 hours. Results for orders placed or performed during the hospital encounter of 10/07/22  Blood culture (routine x 2)     Status: None   Collection Time: 10/07/22  4:37 PM   Specimen: BLOOD RIGHT FOREARM  Result Value Ref Range Status   Specimen Description BLOOD RIGHT FOREARM  Final   Special Requests   Final    BOTTLES DRAWN AEROBIC AND ANAEROBIC Blood Culture adequate volume   Culture   Final    NO GROWTH 5 DAYS Performed at [facility], [place] [address], [place], [place] [address]    Report Status 10/12/2022 FINAL  Final  Blood culture (routine x 2)     Status: None   Collection Time: 10/07/22  6:56 PM   Specimen: BLOOD  Result Value Ref Range Status   Specimen Description BLOOD SITE NOT SPECIFIED  Final   Special Requests   Final    BOTTLES DRAWN  AEROBIC AND ANAEROBIC Blood Culture adequate volume   Culture   Final    NO GROWTH 5 DAYS Performed at [facility], [place] [address], [place], [place] [address]    Report Status 10/12/2022 FINAL  Final   *Note: Due to a large number of results and/or encounters for the requested time period, some results have not been displayed. A complete set of results can be found in Results Review.    Studies/Results: No results found.  Assessment/Plan: 1) Gross hematuria-related to prostatic bleeding.  Continue to hold Eliquis.  He has a history of brachytherapy and recent history of membranous/prostatic urethral stricture.  This could be cancer recurrence.  See below.  On my office cystoscopy and Dr. Gilford Rile note, prostatic urethra appears denuded but no specific lesion noted in no specific bladder lesions are noted.  DC CBI.  2) Progressive pelvic and axillary lymphadenopathy, hypermetabolic soft tissue mass centered behind the prostate and bladder-due to patient's recent suprapubic pain he has had serial CT scans.  Noted progressive adenopathy in the pelvis and soft tissue mass.  I presented him at multidisciplinary tumor board and PET scan was recommended which resulted 11/13/2022.  I spoke to Dr. Earleen Newport with interventional radiology and he recommended biopsy of the pelvic node of the left sidewall and an axillary node.  Patient  could have 2 new primaries such as a reactive bladder or prostate cancer from the prior radiation, recurrence of either or something new such as lymphoma.  This is a good opportunity to go ahead and get the biopsy done off Eliquis.  Consulted IR for biopsy.   LOS: 0 days   Festus Aloe 11/17/2022, 8:30 AM

## 2022-11-17 NOTE — Inpatient Diabetes Management (Addendum)
Inpatient Diabetes Program Recommendations  AACE/ADA: New Consensus Statement on Inpatient Glycemic Control (2015)  Target Ranges:  Prepandial:   less than 140 mg/dL      Peak postprandial:   less than 180 mg/dL (1-2 hours)      Critically ill patients:  140 - 180 mg/dL   Lab Results  Component Value Date   GLUCAP 314 (H) 11/17/2022   HGBA1C 9.9 (H) 06/24/2022    Review of Glycemic Control  Diabetes history: Type 2 DM Outpatient Diabetes medications: Farxiga 10 mg QD (NT) Current orders for Inpatient glycemic control: Semglee 5 units QD, Novolog 0-15 units TID Decadron 10 mg x 1 A1C in process  Inpatient Diabetes Program Recommendations:    Consider: - Increasing Semglee to 20 units QD  Thanks, Bronson Curb, MSN, RNC-OB Diabetes Coordinator [phone] (8a-5p)

## 2022-11-17 NOTE — Consult Note (Signed)
Chief Complaint: Patient was seen in consultation today for adenopathy at the request of Festus Aloe, MD  Supervising Physician: Ruthann Cancer  Patient Status: [facility] - In-pt  History of Present Illness: Wayne [name] is a 81 y.o. male with past medical history significant of cardiac arrest, AICD placement, coronary atherosclerosis, NSTEMI, drug-eluting stent placement, paroxysmal atrial fibrillation on Eliquis at home, hypertension, heart failure, osteoarthritis, anemia, herpes zoster ophthalmicus, OSA not on CPAP, peripheral neuropathy, type 2 diabetes, polymyalgia rheumatica, urolithiasis, bladder cancer, prostate cancer, with history of brachytherapy in 2009, recurrent high-grade T1 bladder cancer, urethral stricture requiring multiple dilatations and recurring UTIs.  He presented to emergency department with gross hematuria, suprapubic pain and Foley catheter dysfunction.  Recent PET CT showed a posterior bladder wall mass concerning for either bladder or prostate CA recurrence. There are also enlarging pelvic and axillary lymph nodes.  IR consulted for biopsy of these.  Endorses current suprapubic pain, low back pain and constipation.  Denies fever, chills, rhinorrhea, sore throat, wheezing or hemoptysis.  No chest pain, palpitations, diaphoresis, PND, orthopnea, but occasionally develops edema of the scrotum.  No abdominal pain, nausea, emesis, diarrhea, melena or hematochezia. No blurred vision, headache, or dizziness.    Past Medical History:  Diagnosis Date   AICD (automatic cardioverter/defibrillator) present    Arthritis    Bladder cancer ([place])    Cancer ([place])    bladder and prostate   Cellulitis of left leg    a. [id number] complicated by septic shock   Chronic systolic CHF (congestive heart failure) (HCC)    CKD (chronic kidney disease), stage III ([place])    pt denies   Colon polyps    CORONARY ATHEROSCLEROSIS NATIVE CORONARY ARTERY cardiologist-  dr Aundra Dubin   a. 01/2011  Cath/PCI: LM nl, LAD 40-50p, D1 80-small, LCX 95-small, RI 90, RCA 100, EF 20%;  b. 01/2011 Card MRI - No transmural scar;  c. 01/2011 PCI RCA->5 Promus DES, RI->3.0x16 Promus DES; d. Cath 01/13/13 patent LAD & Ramus, diffuse LCx dz, RCA mult overlapping stents w/ 95% osital stenosis, EF 20%, s/p DES to ostial/prox RCA 01/24/13    Diabetes mellitus, type 2 ([place])    type 2   Hematoma of leg    a. left leg hematoma 03/2012 in the setting of asa/effient   Herpes zoster ophthalmicus    neuropathy   History of anemia    History of kidney stones    History of non-ST elevation myocardial infarction (NSTEMI) 06/2012   History of prostate cancer    dx 2009---  s/p radioactive seed implants 11-24-2008   HYPERLIPIDEMIA    intolerant to Lipitor (myalgias)   HYPERTENSION    Ischemic cardiomyopathy    a. 01/2012 Echo EF 45%, mild LVH; b. GG26%, grade 1 diastolic dysfunction, diffuse hypokinesis, inferoposterior akinesis    Myocardial infarction ([place])    x2   Noncompliance    Obesity    OSA (obstructive sleep apnea)    dx 2012  severe osa  no cpap just uses nose strips   PAF (paroxysmal atrial fibrillation) ([place])    first dx 01-16-2017   Peripheral neuropathy    Polymyalgia rheumatica ([place])    Prostate cancer ([place])    S/P drug eluting coronary stent placement    total 6 DES    Past Surgical History:  Procedure Laterality Date   CARDIAC CATHETERIZATION  01/24/2013   CARDIAC CATHETERIZATION N/A 11/14/2016   Procedure: Right/Left Heart Cath and Coronary Angiography;  Surgeon: Elby Showers  Aundra Dubin, MD;  Location: [place] [facility];  Service: Cardiovascular;  Laterality: N/A;   CARDIAC CATHETERIZATION N/A 11/17/2016   Procedure: Coronary Stent Intervention w/Impella;  Surgeon: Peter M Martinique, MD;  Location: [place] [facility];  Service: Cardiovascular;  Laterality: N/A;   CARDIAC CATHETERIZATION N/A 11/17/2016   Procedure: Coronary Atherectomy;  Surgeon: Peter M Martinique, MD;  Location: [place] [facility];   Service: Cardiovascular;  Laterality: N/A;   CORONARY ANGIOPLASTY WITH STENT PLACEMENT  01-30-2011   dr Aundra Dubin   PTCA and multiple overlapping DEStenting to RCA and 1 DES to RI    CORONARY BALLOON ANGIOPLASTY N/A 07/06/2017   Procedure: Coronary Balloon Angioplasty;  Surgeon: Sherren Mocha, MD;  Location: [place] [facility];  Service: Cardiovascular;  Laterality: N/A;   CYSTOSCOPY WITH FULGERATION N/A 05/25/2018   Procedure: CYSTOSCOPY WITH FULGERATION/BLADDER BIOPSY , 15 mm lesion;  Surgeon: Festus Aloe, MD;  Location: [facility];  Service: Urology;  Laterality: N/A;   CYSTOSCOPY WITH FULGERATION N/A 11/16/2022   Procedure: CYSTOSCOPY WITH FULGERATION; CLOT EVACUATION;  Surgeon: Ceasar Mons, MD;  Location: [facility];  Service: Urology;  Laterality: N/A;   CYSTOSCOPY WITH URETHRAL DILATATION N/A 08/09/2021   Procedure: CYSTOSCOPY WITH URETHRAL DILATATION WITH OPTILUME;  Surgeon: Festus Aloe, MD;  [facility];  Service: Urology;  Laterality: N/A;   CYSTOSCOPY WITH URETHRAL DILATATION N/A 07/01/2022   Procedure: CYSTOSCOPY WITH BALLOON DILATATION OF PROSTATIC URETHRA;  Surgeon: Festus Aloe, MD;  [facility];  Service: Urology;  Laterality: N/A;   I & D EXTREMITY  06/15/2012   Procedure: IRRIGATION AND DEBRIDEMENT EXTREMITY;  Surgeon: Newt Minion, MD;  Location: [place];  Service: Orthopedics;  Laterality: Left;  I&D Left Posterior Knee   I & D EXTREMITY  06/30/2012   Procedure: IRRIGATION AND DEBRIDEMENT EXTREMITY;  Surgeon: Newt Minion, MD;  Location: [place];  Service: Orthopedics;  Laterality: Left;  Left Leg Irrigation and Debridement and placement of Wound VAC and application of  A-cell   I & D EXTREMITY  07/20/2012   Procedure: IRRIGATION AND DEBRIDEMENT EXTREMITY;  Surgeon: Newt Minion, MD;  Location: [place];  Service: Orthopedics;  Laterality: Left;  Irrigation and Debridement Left Leg and Place antibiotic beads    ICD IMPLANT N/A 02/12/2017   Procedure: ICD  Implant;  Surgeon: Evans Lance, MD;  Location: [place] [facility];  Service: Cardiovascular;  Laterality: N/A;   LAPAROSCOPIC INGUINAL HERNIA REPAIR Right 09-11-2008   dr Barry Dienes  [facility]   LEAD REVISION/REPAIR N/A 05/26/2017   Procedure: Lead Revision/Repair;  Surgeon: Evans Lance, MD;  Location: [place] [facility];  Service: Cardiovascular;  Laterality: N/A;   LEFT HEART CATH AND CORONARY ANGIOGRAPHY N/A 07/06/2017   Procedure: Left Heart Cath and Coronary Angiography;  Surgeon: Larey Dresser, MD;  Location: [place] [facility];  Service: Cardiovascular;  Laterality: N/A;   PERCUTANEOUS CORONARY STENT INTERVENTION (PCI-S) N/A 01/24/2013   Procedure: PERCUTANEOUS CORONARY STENT INTERVENTION (PCI-S);  Surgeon: Wellington Hampshire, MD;  Location: [facility];  Service: Cardiovascular;  Laterality: N/A;   RADIOACTIVE PROSTATE SEED IMPLANTS  11-24-2008   dr Tresa Endo  [place] TUMOR N/A 10/30/2017   Procedure: TRANSURETHRAL RESECTION OF BLADDER TUMOR (TURBT)/ INSTILLATION OF EPIRUBICIN;  Surgeon: Festus Aloe, MD;  [facility];  Service: Urology;  Laterality: N/A;   TRANSURETHRAL RESECTION OF BLADDER TUMOR N/A 05/18/2020   Procedure: TRANSURETHRAL RESECTION OF BLADDER TUMOR (TURBT)  0.5-2.0 cm BILATERAL RETROGRADE PYELOGRAM;  Surgeon: Festus Aloe, MD;  [facility];  Service: Urology;  Laterality: N/A;   ULTRASOUND GUIDANCE FOR VASCULAR ACCESS  11/14/2016   Procedure: Ultrasound Guidance For Vascular Access;  Surgeon: Larey Dresser, MD;  Location: [place] [facility];  Service: Cardiovascular;;   V TACH ABLATION N/A 06/19/2017   Procedure: V Tach Ablation;  Surgeon: Evans Lance, MD;  Location: [place] [facility];  Service: Cardiovascular;  Laterality: N/A;    Allergies: Entresto [sacubitril-valsartan], Crestor [rosuvastatin calcium], Lipitor [atorvastatin], Plavix [clopidogrel], and Ancef [cefazolin]  Medications: Prior to Admission  medications   Medication Sig Start Date End Date Taking? Authorizing Provider  carvedilol (COREG) 12.5 MG tablet TAKE 1 TABLET (12.5 MG TOTAL) BY MOUTH 2 (TWO) TIMES DAILY WITH A MEAL. 11/05/22  Yes Clegg, Amy D, NP  diphenhydramine-acetaminophen (TYLENOL PM) 25-500 MG TABS tablet Take 1 tablet at bedtime as needed by mouth (for sleep).    Yes [provider]  ELIQUIS 5 MG TABS tablet TAKE 1 TABLET BY MOUTH TWICE A DAY 08/04/22  Yes Larey Dresser, MD  furosemide (LASIX) 20 MG tablet Take '20mg'$  (1 tablet)by mouth daily alternating with '40mg'$  (2 tablets) every other day. Patient taking differently: Take 20-40 mg by mouth See admin instructions. Take 20 mg by mouth every other morning, ALTERNATING with 40 mg 04/04/22  Yes Milford, Maricela Bo, FNP  losartan (COZAAR) 25 MG tablet Take 1 tablet (25 mg total) by mouth in the morning and at bedtime. 05/29/22  Yes Larey Dresser, MD  Multiple Vitamin (MULTIVITAMIN WITH MINERALS) TABS tablet Take 1 tablet by mouth daily with breakfast. Centrum Silver Adult 50+   Yes [provider]  Omega-3 Fatty Acids (FISH OIL) 1000 MG CAPS Take 1,000 mg by mouth 2 (two) times daily.    Yes [provider]  rosuvastatin (CRESTOR) 40 MG tablet Take 1 tablet (40 mg total) by mouth daily. 06/19/22  Yes Larey Dresser, MD  spironolactone (ALDACTONE) 25 MG tablet Take 0.5 tablets (12.5 mg total) by mouth daily. Patient taking differently: Take 12.5 mg by mouth in the morning. 04/22/22  Yes Milford, Maricela Bo, FNP  TYLENOL 8 HOUR ARTHRITIS PAIN 650 MG CR tablet Take 650-1,300 mg by mouth every 8 (eight) hours as needed for pain.   Yes [provider]  bacitracin ointment Apply 1 Application topically 2 (two) times daily. Apply to penis tip Patient not taking: Reported on 11/16/2022 10/07/22   Elgie Congo, MD  dapagliflozin propanediol (FARXIGA) 10 MG TABS tablet Take 1 tablet (10 mg total) by mouth daily before breakfast. Patient not  taking: Reported on 11/16/2022 04/04/22   Rafael Bihari, FNP  doxycycline (VIBRAMYCIN) 100 MG capsule Take 1 capsule (100 mg total) by mouth 2 (two) times daily. Patient not taking: Reported on 11/16/2022 10/07/22   Elgie Congo, MD  glucose blood (ACCU-CHEK AVIVA PLUS) test strip USE TO CHECK BLOOD SUGAR ONCE TO TWICE DAILY 12/02/19   Burchette, Alinda Sierras, MD  methimazole (TAPAZOLE) 10 MG tablet Take 1 tablet (10 mg total) by mouth daily. Patient not taking: Reported on 11/16/2022 04/04/22   Rafael Bihari, FNP  mexiletine (MEXITIL) 150 MG capsule Take 1 capsule (150 mg total) by mouth 2 (two) times daily. Patient not taking: Reported on 11/16/2022 04/04/22   Rafael Bihari, FNP  nitrofurantoin, macrocrystal-monohydrate, (MACROBID) 100 MG capsule Take 1 capsule (100 mg total) by mouth at bedtime. Patient not taking: Reported  on 11/16/2022 07/01/22   Festus Aloe, MD  rosuvastatin (CRESTOR) 20 MG tablet Take 20 mg by mouth in the morning. Patient not taking: Reported on 11/16/2022 05/15/22   [provider]  rosuvastatin (CRESTOR) 40 MG tablet Take 1 tablet (40 mg total) by mouth daily. Patient not taking: Reported on 11/16/2022 09/19/21   Larey Dresser, MD  sodium zirconium cyclosilicate (LOKELMA) 5 g packet Take 5 g by mouth daily. Patient not taking: Reported on 06/20/2022 04/22/22   Rafael Bihari, FNP  tamsulosin (FLOMAX) 0.4 MG CAPS capsule Take 0.4 mg by mouth at bedtime. Patient not taking: Reported on 11/16/2022 02/17/19   [provider]  TRUEplus Lancets 30G MISC USE TO CHECK BLOOD SUGAR EVERY DAY TO TWICE DAILY 03/15/20   Burchette, Alinda Sierras, MD     Family History  Problem Relation Age of Onset   Cancer Mother 9       unknown CA   Heart disease Father    Alcohol abuse Father    Heart attack Father 58   Heart attack Brother     Social History   Socioeconomic History   Marital status: Married    Spouse name: Not on file   Number of children: 1    Years of education: Not on file   Highest education level: Not on file  Occupational History   Occupation: RETIRED    Employer: RETIRED    Comment: TRUCK DRIVER  Tobacco Use   Smoking status: Former    Packs/day: 0.30    Years: 20.00    Total pack years: 6.00    Types: Cigarettes    Quit date: 02/23/1989    Years since quitting: 33.7   Smokeless tobacco: Never  Vaping Use   Vaping Use: Never used  Substance and Sexual Activity   Alcohol use: No   Drug use: No   Sexual activity: Yes  Other Topics Concern   Not on file  Social History Narrative   Retired Administrator   Social Determinants of Health   Financial Resource Strain: [place]  (09/20/2018)   Overall Financial Resource Strain (CARDIA)    Difficulty of Paying Living Expenses: Not hard at all  Food Insecurity: No Food Insecurity (11/16/2022)   Hunger Vital Sign    Worried About Running Out of Food in the Last Year: Never true    [place] in the Last Year: Never true  Transportation Needs: No Transportation Needs (11/16/2022)   PRAPARE - Hydrologist (Medical): No    Lack of Transportation (Non-Medical): No  Physical Activity: Not on file  Stress: Not on file  Social Connections: Not on file    Review of Systems: A 12 point ROS discussed and pertinent positives are indicated in the HPI above.  All other systems are negative.  Vital Signs: BP (!) 102/55 (BP Location: Left Arm)   Pulse 73   Temp 98.1 F (36.7 C) (Oral)   Resp 18   Ht '5\' 8"'$  (1.727 m)   Wt 220 lb (99.8 kg)   SpO2 92%   BMI 33.45 kg/m   Physical Exam Vitals reviewed.  Constitutional:      General: He is not in acute distress.    Appearance: He is ill-appearing.  HENT:     Head: Normocephalic and atraumatic.     Mouth/Throat:     Mouth: Mucous membranes are moist.     Pharynx: Oropharynx is clear.  Eyes:  Extraocular Movements: Extraocular movements intact.     Conjunctiva/sclera: Conjunctivae  normal.  Cardiovascular:     Rate and Rhythm: Normal rate.  Pulmonary:     Effort: Pulmonary effort is normal. No respiratory distress.  Abdominal:     Palpations: Abdomen is soft.     Tenderness: There is abdominal tenderness.  Skin:    General: Skin is warm and dry.  Neurological:     General: No focal deficit present.     Mental Status: He is alert. Mental status is at baseline.  Psychiatric:        Mood and Affect: Mood normal.        Behavior: Behavior normal.     Imaging: NM PET Image Initial (PI) Skull Base To Thigh (F-18 FDG)  Result Date: 11/13/2022 CLINICAL DATA:  Initial treatment strategy for bladder cancer. Surgery x1. History of prostate cancer with radiation seeds in 1986. Rectal pain. EXAM: NUCLEAR MEDICINE PET SKULL BASE TO THIGH TECHNIQUE: 12.7 mCi F-18 FDG was injected intravenously. Full-ring PET imaging was performed from the skull base to thigh after the radiotracer. CT data was obtained and used for attenuation correction and anatomic localization. Fasting blood glucose: 255 mg/dl COMPARISON:  10/07/2022 abdominopelvic CT. FINDINGS: Mediastinal blood pool activity: SUV max 1.8 Liver activity: SUV max 2.8 NECK: Extensive non FDG avid cervical adenopathy. Example low right posterior triangle 1.5 cm node on 51/2 measures a S.U.V. max of 1.1. Incidental CT findings: None. CHEST: No pulmonary parenchymal hypermetabolism identified. Bilateral axillary adenopathy. Example left axillary node of 2.5 cm and a S.U.V. max of 1.7 on 81/2. Right paratracheal node measures 2.5 cm and a S.U.V. max of 1.8 on 81/2. Incidental CT findings: Pacer. Moderate cardiomegaly. Aortic and coronary artery calcification. Pulmonary artery enlargement, outflow tract 3.2 cm. ABDOMEN/PELVIS: Left periaortic 11 mm node on 169/2 measures a S.U.V. max of 1.3. 9 mm on the prior CT. A presacral upper pelvic node measures 1.5 cm and a S.U.V. max of 2.6 on 208/2. Compare 1.1 cm on 10/07/2022 (when remeasured).  Left greater than right pelvic sidewall hypermetabolic adenopathy. Example left external iliac 1.6 cm node measures a S.U.V. max of 2.5 on 217/2. Increased from 11 mm on the prior diagnostic CT (when remeasured). Amorphous soft tissue mass centered about the posterior wall of the bladder and superior portion of the prostate is mildly hypermetabolic. Difficult to measure secondary to ill-defined morphology. On the order of 6.0 x 5.0 cm and a S.U.V. max of 4.6 on 226/2. Incidental CT findings: Radiation seeds in the prostate. Urinary bladder thick-walled but collapsed around a Foley catheter. Dependent gallstones. Normal adrenal glands. Abdominal aortic as sclerosis. Lower pole right renal low-density 2.3 cm lesion is likely a cyst or minimally complex cyst . In the absence of clinically indicated signs/symptoms require(s) no independent follow-up. Small fat containing bilateral inguinal hernias. SKELETON: Left subscapularis hypermetabolism is likely due to tendinous strain. No marrow hypermetabolism identified. Incidental CT findings: None. IMPRESSION: 1. Hypermetabolic soft tissue mass centered about the posterior bladder and superior aspect of the prostate. Given history of both prostate and bladder cancer, this could represent direct extension of either or both primaries. 2. Progressive hypermetabolic pelvic adenopathy since 10/07/2022, consistent with metastatic disease. 3. Concurrent relatively non FDG avid cervical and thoracic adenopathy, favored to represent a synchronous lymphoproliferative process such as indolent lymphoma. An atypical distribution of metastatic disease felt less likely. Consider tissue sampling of axillary or supraclavicular nodes. 4. Progressive abdominal adenopathy which could be from either  of the patient's presumed to primaries. 5. Incidental findings, including: Coronary artery atherosclerosis. Aortic Atherosclerosis (ICD10-I70.0). Cholelithiasis. Pulmonary artery enlargement suggests  pulmonary arterial hypertension. Electronically Signed   By: Abigail Miyamoto M.D.   On: 11/13/2022 09:27    Labs:  CBC: Recent Labs    06/24/22 1359 10/07/22 1637 11/16/22 0832 11/17/22 0359  WBC 7.4 9.6 9.9 10.5  HGB 15.1 13.9 13.8 12.1*  HCT 47.7 42.5 43.2 40.5  PLT 157 221 215 168   BMP: Recent Labs    06/24/22 1359 10/07/22 1637 11/16/22 0832 11/17/22 0359  NA 137 133* 135 131*  K 5.2* 3.9 4.8 5.5*  CL 107 101 105 102  CO2 25 23 20* 21*  GLUCOSE 190* 319* 305* 376*  BUN 25* '13 19 23  '$ CALCIUM 9.4 9.1 9.3 8.6*  CREATININE 1.40* 1.51* 1.60* 1.24  GFRNONAA 51* 47* 44* 59*    LIVER FUNCTION TESTS: Recent Labs    04/22/22 1156 10/07/22 1637 11/16/22 0832 11/17/22 0359  BILITOT 0.8 0.9 0.6 0.7  AST '26 22 27 17  '$ ALT '24 18 17 14  '$ ALKPHOS 69 98 68 61  PROT 6.2* 6.8 6.7 6.0*  ALBUMIN 3.5 2.9* 3.4* 3.0*    Assessment and Plan:  Obstructive uropathy on 81 year old male.  He has a history of prostate and bladder cancers with imaging demonstrating posterior bladder wall mass suggestive of recurrence. Additionally, there are enlarged lymph nodes.  IR asked to biopsy inguinal and axillary nodes to further characterize recurrence.  Case reviewed and approved by Drs. Earleen Newport and Suttle.  Patient has not had Eliquis in 4 days.  He will be NPO at midnight and appropriate labs are ordered.  Anticipate doing biopsies tomorrow, 12/5, with moderate sedation per patient request, as IR and CT schedules allow.  Thank you for this interesting consult.  I greatly enjoyed meeting PRYNCE JACOBER and look forward to participating in their care.  A copy of this report was sent to the requesting provider on this date.  Risks and benefits of biopsy was discussed with the patient and/or patient's family including, but not limited to bleeding, infection, damage to adjacent structures or low yield requiring additional tests.  All of the questions were answered and there is agreement to  proceed.  Consent signed and in chart.   Electronically Signed: Pasty Spillers, PA 11/17/2022, 2:11 PM   I spent a total of 40 Minutes in face to face in clinical consultation, greater than 50% of which was counseling/coordinating care for obstructive uropathy.

## 2022-11-17 NOTE — Progress Notes (Signed)
Mobility Specialist - Progress Note   11/17/22 1026  Mobility  Activity Ambulated with assistance in hallway  Level of Assistance Minimal assist, patient does 75% or more  Assistive Device Front wheel walker  Distance Ambulated (ft) 120 ft  Range of Motion/Exercises Active  Activity Response Tolerated well  Mobility Referral Yes  $Mobility charge 1 Mobility   Pt was found in bed and agreeable to ambulate. Pt was min-A going form sitting to standing and contact guard for ambulation. Had x1 brief standing rest break during ambulation due to having a 6/10 pain "in between my legs" referring to his groin . At EOS returned to bed with necessities in reach and RN notified of session.  Ferd Hibbs Mobility Specialist

## 2022-11-18 ENCOUNTER — Other Ambulatory Visit (HOSPITAL_COMMUNITY): Payer: Self-pay | Admitting: Cardiology

## 2022-11-18 ENCOUNTER — Inpatient Hospital Stay (HOSPITAL_COMMUNITY): Payer: Medicare Other

## 2022-11-18 DIAGNOSIS — N139 Obstructive and reflux uropathy, unspecified: Secondary | ICD-10-CM | POA: Diagnosis not present

## 2022-11-18 DIAGNOSIS — T839XXA Unspecified complication of genitourinary prosthetic device, implant and graft, initial encounter: Secondary | ICD-10-CM | POA: Diagnosis not present

## 2022-11-18 LAB — COMPREHENSIVE METABOLIC PANEL
ALT: 12 U/L (ref 0–44)
AST: 26 U/L (ref 15–41)
Albumin: 2.8 g/dL — ABNORMAL LOW (ref 3.5–5.0)
Alkaline Phosphatase: 54 U/L (ref 38–126)
Anion gap: 7 (ref 5–15)
BUN: 26 mg/dL — ABNORMAL HIGH (ref 8–23)
CO2: 22 mmol/L (ref 22–32)
Calcium: 8.5 mg/dL — ABNORMAL LOW (ref 8.9–10.3)
Chloride: 106 mmol/L (ref 98–111)
Creatinine, Ser: 1.33 mg/dL — ABNORMAL HIGH (ref 0.61–1.24)
GFR, Estimated: 54 mL/min — ABNORMAL LOW (ref >60)
Glucose, Bld: 225 mg/dL — ABNORMAL HIGH (ref 70–99)
Potassium: 4.7 mmol/L (ref 3.5–5.1)
Sodium: 135 mmol/L (ref 135–145)
Total Bilirubin: 0.9 mg/dL (ref 0.3–1.2)
Total Protein: 5.9 g/dL — ABNORMAL LOW (ref 6.5–8.1)

## 2022-11-18 LAB — CBC
HCT: 38.7 % — ABNORMAL LOW (ref 39.0–52.0)
Hemoglobin: 11.9 g/dL — ABNORMAL LOW (ref 13.0–17.0)
MCH: 28.1 pg (ref 26.0–34.0)
MCHC: 30.7 g/dL (ref 30.0–36.0)
MCV: 91.5 fL (ref 80.0–100.0)
Platelets: 191 10*3/uL (ref 150–400)
RBC: 4.23 MIL/uL (ref 4.22–5.81)
RDW: 12.9 % (ref 11.5–15.5)
WBC: 8.8 10*3/uL (ref 4.0–10.5)
nRBC: 0 % (ref 0.0–0.2)

## 2022-11-18 LAB — HEMOGLOBIN A1C
Hgb A1c MFr Bld: 11.2 % — ABNORMAL HIGH (ref 4.8–5.6)
Mean Plasma Glucose: 275 mg/dL

## 2022-11-18 LAB — GLUCOSE, CAPILLARY
Glucose-Capillary: 196 mg/dL — ABNORMAL HIGH (ref 70–99)
Glucose-Capillary: 179 mg/dL — ABNORMAL HIGH (ref 70–99)
Glucose-Capillary: 226 mg/dL — ABNORMAL HIGH (ref 70–99)
Glucose-Capillary: 181 mg/dL — ABNORMAL HIGH (ref 70–99)

## 2022-11-18 MED ORDER — MIDAZOLAM HCL 2 MG/2ML IJ SOLN
INTRAMUSCULAR | Status: AC | PRN
  Administered 2022-11-18 (×2): .5 mg via INTRAVENOUS

## 2022-11-18 MED ORDER — CHLORHEXIDINE GLUCONATE CLOTH 2 % EX PADS
6.0000 | MEDICATED_PAD | Freq: Every day | CUTANEOUS | Status: DC
  Administered 2022-11-18 – 2022-12-07 (×19): 6 via TOPICAL

## 2022-11-18 MED ORDER — MIDAZOLAM HCL 2 MG/2ML IJ SOLN
INTRAMUSCULAR | Status: AC
  Filled 2022-11-18: qty 4

## 2022-11-18 MED ORDER — LIVING WELL WITH DIABETES BOOK
Freq: Once | Status: AC
  Filled 2022-11-18: qty 1

## 2022-11-18 MED ORDER — SODIUM CHLORIDE 0.9 % IV SOLN
INTRAVENOUS | Status: AC
  Filled 2022-11-18: qty 500

## 2022-11-18 MED ORDER — MIDAZOLAM HCL 2 MG/2ML IJ SOLN
INTRAMUSCULAR | Status: AC
  Filled 2022-11-18: qty 2

## 2022-11-18 MED ORDER — INSULIN STARTER KIT- PEN NEEDLES (ENGLISH)
1.0000 | Freq: Once | Status: AC
  Administered 2022-11-18: 1
  Filled 2022-11-18: qty 1

## 2022-11-18 MED ORDER — FENTANYL CITRATE (PF) 100 MCG/2ML IJ SOLN
INTRAMUSCULAR | Status: AC | PRN
  Administered 2022-11-18: 25 ug via INTRAVENOUS

## 2022-11-18 MED ORDER — FENTANYL CITRATE (PF) 100 MCG/2ML IJ SOLN
INTRAMUSCULAR | Status: AC
  Filled 2022-11-18: qty 4

## 2022-11-18 MED ORDER — SODIUM CHLORIDE 0.9 % IV SOLN
INTRAVENOUS | Status: AC
  Filled 2022-11-18: qty 250

## 2022-11-18 NOTE — Progress Notes (Signed)
Report received from I. Oraegbunam,RN. No change in previous assessment. Biopsy sites to left groin & left axilla covered with bandaid and are clean, dry, & intact. Tavyn Kurka, Laurel Dimmer, RN

## 2022-11-18 NOTE — Progress Notes (Signed)
Patient resting in bed, in no distress, biopsy site clean/dry/intact.

## 2022-11-18 NOTE — Procedures (Signed)
Interventional Radiology Procedure Note  Procedure: CT and US Guided Biopsy of left external iliac and left axillary lymph nodes  Complications: None  Estimated Blood Loss: < 10 mL  Findings: 18 G core biopsy of 2 cm left iliac LN performed under CT guidance.  Six core samples obtained and sent to Pathology in formalin and saline.  65 G core biopsy of 1.5 cm left axillary LN performed under US guidance. Six core samples sent to Path in formalin and saline.  Venetia Night. Kathlene Cote, M.D Pager:  [phone]

## 2022-11-18 NOTE — Progress Notes (Signed)
Progress Note   Patient: Wayne [name] HMC:[id number] DOB: May 06, 1941 DOA: 11/16/2022     1 DOS: the patient was seen and examined on 11/18/2022   Brief hospital course: 81 y.o. male with medical history significant of cardiac arrest, AICD placement, coronary arthrosclerosis, history of NSTEMI, drug-eluting stent placement, paroxysmal atrial fibrillation on apixaban, hypertension, chronic combined systolic and diastolic heart failure, osteoarthritis, cellulitis of the left lower extremity, history of anemia, history of herpes zoster ophthalmicus, noncompliance, obesity, OSA not on CPAP, peripheral neuropathy, type 2 diabetes, polymyalgia rheumatica, urolithiasis, bladder cancer, prostate cancer, with history of brachytherapy in 2009, recurrent high-grade T1 bladder cancer, urethral stricture requiring multiple dilatations and recurring UTIs who presented to [facility] emergency department with gross hematuria and Foley catheter dysfunction.  He has been seen hematuria and has had catheter dysfunction for the past few days.  While he was in the ER, attempts to to irrigate his catheter and even exchange his catheter were performed, but these measures were unsuccessful relieving the obstruction and clotted blood.  His last PSA was undetectable.  Recent PET CT scan showed a posterior bladder wall mass concerning for either bladder or prostate CA recurrence. He has had suprapubic pain, but no flank pain or hematuria.  Denied fever, chills, rhinorrhea, sore throat, wheezing or hemoptysis.  No chest pain, palpitations, diaphoresis, PND, orthopnea, but occasionally develops pitting edema of the lower extremities.  No abdominal pain, nausea, emesis, diarrhea, constipation, melena or hematochezia. No polyuria, polydipsia, polyphagia or blurred vision.    ED course: Initial vital signs were temperature 98.5 F, pulse 96, respiration 24, BP 109/79 mmHg O2 sat 100% on room air.  The patient received fentanyl 50 mcg IVP and  2% lidocaine jelly was used during Foley exchange attempt.  While in PACU, the patient received oxycodone 5 mg p.o. x 2 and continuous bladder irrigation was initiated  Assessment and Plan: Principal Problem:   Gross hematuria In the setting of:   Obstructive uropathy -Urology following -Noted to be related to prostatic bleed -eliquis on hold -Hematuria now resolved s/p CBI -Concerns of progressive abdominal adenopathy, now s/p biopsy by IR. Follow up path -Continued  on empiric cipro per Urology   Active Problems:   Hyperlipidemia Continue rosuvastatin 20 mg p.o. daily.     Essential hypertension Continue carvedilol 12.5 mg p.o. daily. Continue furosemide 20 alternating with '40mg'$  every other day    Systolic CHF, chronic (HCC) No signs of decompensation. Continue beta-blocker, ARB and diuretics as above. Follow-up with cardiology as an outpatient.     OSA (obstructive sleep apnea) Not on CPAP     CAD (coronary artery disease) Continue beta-blocker and statin.     CKD (chronic kidney disease) stage 3, GFR 30-59 ml/min (HCC) Monitor renal function electrolytes.     Paroxysmal atrial fibrillation (HCC) Hold Eliquis for now. Continue carvedilol for rate control.     Type 2 diabetes mellitus with hyperglycemia (HCC) Carbohydrate modified diet. No longer on Farxiga. Glucose trends are uncontrolled, up to 300's Now on semglee 20 units A1c of 11.2. Will need insulin on d/c Diabetic coordinator following  Hyperkalemia -Normalized after lokelma -Repeat bmet in AM     Subjective: seen after biopsy today. Without complaints. Denies post-procedure pain  Physical Exam: Vitals:   11/18/22 1322 11/18/22 1337 11/18/22 1352 11/18/22 1506  BP: (!) 92/48 (!) 100/53 (!) 96/55 (!) 99/50  Pulse:  69 69 67  Resp: '18 20 18 18  '$ Temp:    97.8 F (  36.6 C)  TempSrc:    Oral  SpO2:  95% 92% 95%  Weight:      Height:       General exam: Conversant, in no acute  distress Respiratory system: normal chest rise, clear, no audible wheezing Cardiovascular system: regular rhythm, s1-s2 Gastrointestinal system: Nondistended, nontender, pos BS Central nervous system: No seizures, no tremors Extremities: No cyanosis, no joint deformities Skin: No rashes, no pallor Psychiatry: Affect normal // no auditory hallucinations   Data Reviewed:  Labs reviewed: Na 135, K 4.7, Cr 1.33  Family Communication: Pt in room, family not at bedside  Disposition: Status is: Inpatient Remains inpatient appropriate because: Severity of illness  Planned Discharge Destination: Home    Author: Marylu Lund, MD 11/18/2022 3:21 PM  For on call review [email or web address].

## 2022-11-18 NOTE — Progress Notes (Signed)
Patient returned back in room post biopsy. Patient alert and oriented denies any pain/distress. Vital WNL, BP in the 90, was running in the 90 during procedure. Biopsy site clean/dry/intact. Will continue to assess patient.

## 2022-11-18 NOTE — Progress Notes (Signed)
S: Pt with some dull SP ache.   O: Alert and oriented  Urine clear  CBC    Component Value Date/Time   WBC 8.8 11/18/2022 0437   RBC 4.23 11/18/2022 0437   HGB 11.9 (L) 11/18/2022 0437   HCT 38.7 (L) 11/18/2022 0437   PLT 191 11/18/2022 0437   MCV 91.5 11/18/2022 0437   MCH 28.1 11/18/2022 0437   MCHC 30.7 11/18/2022 0437   RDW 12.9 11/18/2022 0437   LYMPHSABS 2.9 11/16/2022 0832   MONOABS 1.0 11/16/2022 0832   EOSABS 0.2 11/16/2022 0832   BASOSABS 0.1 11/16/2022 0832   Lab Results  Component Value Date   CREATININE 1.33 (H) 11/18/2022   CREATININE 1.24 11/17/2022   CREATININE 1.60 (H) 11/16/2022     A/P:  Gross hematuria - resolved  LAD - IR bx today

## 2022-11-19 DIAGNOSIS — G4733 Obstructive sleep apnea (adult) (pediatric): Secondary | ICD-10-CM

## 2022-11-19 DIAGNOSIS — N139 Obstructive and reflux uropathy, unspecified: Secondary | ICD-10-CM | POA: Diagnosis not present

## 2022-11-19 DIAGNOSIS — I10 Essential (primary) hypertension: Secondary | ICD-10-CM

## 2022-11-19 DIAGNOSIS — I2510 Atherosclerotic heart disease of native coronary artery without angina pectoris: Secondary | ICD-10-CM

## 2022-11-19 DIAGNOSIS — I5022 Chronic systolic (congestive) heart failure: Secondary | ICD-10-CM | POA: Diagnosis not present

## 2022-11-19 DIAGNOSIS — N1831 Chronic kidney disease, stage 3a: Secondary | ICD-10-CM

## 2022-11-19 DIAGNOSIS — E1165 Type 2 diabetes mellitus with hyperglycemia: Secondary | ICD-10-CM

## 2022-11-19 DIAGNOSIS — I480 Paroxysmal atrial fibrillation: Secondary | ICD-10-CM

## 2022-11-19 DIAGNOSIS — R310 Gross hematuria: Secondary | ICD-10-CM

## 2022-11-19 DIAGNOSIS — E785 Hyperlipidemia, unspecified: Secondary | ICD-10-CM

## 2022-11-19 LAB — COMPREHENSIVE METABOLIC PANEL WITH GFR
ALT: 15 U/L (ref 0–44)
AST: 17 U/L (ref 15–41)
Albumin: 3 g/dL — ABNORMAL LOW (ref 3.5–5.0)
Alkaline Phosphatase: 59 U/L (ref 38–126)
Anion gap: 9 (ref 5–15)
BUN: 27 mg/dL — ABNORMAL HIGH (ref 8–23)
CO2: 24 mmol/L (ref 22–32)
Calcium: 8.5 mg/dL — ABNORMAL LOW (ref 8.9–10.3)
Chloride: 103 mmol/L (ref 98–111)
Creatinine, Ser: 1.31 mg/dL — ABNORMAL HIGH (ref 0.61–1.24)
GFR, Estimated: 55 mL/min — ABNORMAL LOW
Glucose, Bld: 178 mg/dL — ABNORMAL HIGH (ref 70–99)
Potassium: 3.9 mmol/L (ref 3.5–5.1)
Sodium: 136 mmol/L (ref 135–145)
Total Bilirubin: 0.6 mg/dL (ref 0.3–1.2)
Total Protein: 5.9 g/dL — ABNORMAL LOW (ref 6.5–8.1)

## 2022-11-19 LAB — GLUCOSE, CAPILLARY
Glucose-Capillary: 186 mg/dL — ABNORMAL HIGH (ref 70–99)
Glucose-Capillary: 208 mg/dL — ABNORMAL HIGH (ref 70–99)
Glucose-Capillary: 184 mg/dL — ABNORMAL HIGH (ref 70–99)
Glucose-Capillary: 208 mg/dL — ABNORMAL HIGH (ref 70–99)

## 2022-11-19 LAB — URINALYSIS, ROUTINE W REFLEX MICROSCOPIC
Bilirubin Urine: NEGATIVE
Glucose, UA: NEGATIVE mg/dL
Ketones, ur: NEGATIVE mg/dL
Nitrite: NEGATIVE
Protein, ur: 30 mg/dL — AB
RBC / HPF: >50 RBC/hpf — ABNORMAL HIGH (ref 0–5)
Specific Gravity, Urine: 1.005 (ref 1.005–1.030)
WBC, UA: >50 WBC/hpf — ABNORMAL HIGH (ref 0–5)
pH: 5 (ref 5.0–8.0)

## 2022-11-19 LAB — CBC
HCT: 37.3 % — ABNORMAL LOW (ref 39.0–52.0)
Hemoglobin: 11.4 g/dL — ABNORMAL LOW (ref 13.0–17.0)
MCH: 28.1 pg (ref 26.0–34.0)
MCHC: 30.6 g/dL (ref 30.0–36.0)
MCV: 91.9 fL (ref 80.0–100.0)
Platelets: 186 10*3/uL (ref 150–400)
RBC: 4.06 MIL/uL — ABNORMAL LOW (ref 4.22–5.81)
RDW: 13 % (ref 11.5–15.5)
WBC: 8.7 10*3/uL (ref 4.0–10.5)
nRBC: 0 % (ref 0.0–0.2)

## 2022-11-19 MED ORDER — APIXABAN 5 MG PO TABS
5.0000 mg | ORAL_TABLET | Freq: Two times a day (BID) | ORAL | Status: DC
  Administered 2022-11-19 – 2022-11-21 (×5): 5 mg via ORAL
  Filled 2022-11-19 (×5): qty 1

## 2022-11-19 MED ORDER — INSULIN GLARGINE-YFGN 100 UNIT/ML ~~LOC~~ SOLN
22.0000 [IU] | Freq: Every day | SUBCUTANEOUS | Status: DC
  Administered 2022-11-20: 22 [IU] via SUBCUTANEOUS
  Filled 2022-11-19: qty 0.22

## 2022-11-19 MED ORDER — ORAL CARE MOUTH RINSE: 15.0000 mL | OROMUCOSAL | Status: DC | PRN

## 2022-11-19 MED ORDER — POLYETHYLENE GLYCOL 3350 17 G PO PACK
17.0000 g | PACK | Freq: Two times a day (BID) | ORAL | Status: DC
  Administered 2022-11-19 – 2022-11-20 (×3): 17 g via ORAL
  Filled 2022-11-19 (×3): qty 1

## 2022-11-19 MED ORDER — ENSURE MAX PROTEIN PO LIQD
11.0000 [oz_av] | Freq: Every day | ORAL | Status: DC
  Administered 2022-11-20 – 2022-12-07 (×15): 11 [oz_av] via ORAL
  Filled 2022-11-19 (×19): qty 330

## 2022-11-19 MED ORDER — SORBITOL 70 % SOLN
30.0000 mL | Status: AC
  Administered 2022-11-19: 30 mL via ORAL
  Filled 2022-11-19 (×2): qty 30

## 2022-11-19 MED ORDER — SENNOSIDES-DOCUSATE SODIUM 8.6-50 MG PO TABS
1.0000 | ORAL_TABLET | Freq: Two times a day (BID) | ORAL | Status: DC
  Administered 2022-11-19 – 2022-12-02 (×20): 1 via ORAL
  Filled 2022-11-19 (×22): qty 1

## 2022-11-19 MED ORDER — OXYBUTYNIN CHLORIDE 5 MG PO TABS
5.0000 mg | ORAL_TABLET | Freq: Three times a day (TID) | ORAL | Status: DC
  Administered 2022-11-19 – 2022-12-08 (×56): 5 mg via ORAL
  Filled 2022-11-19 (×57): qty 1

## 2022-11-19 MED ORDER — BISACODYL 10 MG RE SUPP
10.0000 mg | Freq: Every day | RECTAL | Status: DC
  Filled 2022-11-19 (×2): qty 1

## 2022-11-19 NOTE — Inpatient Diabetes Management (Signed)
Inpatient Diabetes Program Recommendations  AACE/ADA: New Consensus Statement on Inpatient Glycemic Control (2015)  Target Ranges:  Prepandial:   less than 140 mg/dL      Peak postprandial:   less than 180 mg/dL (1-2 hours)      Critically ill patients:  140 - 180 mg/dL   Lab Results  Component Value Date   GLUCAP 186 (H) 11/19/2022   HGBA1C 11.2 (H) 11/17/2022    Review of Glycemic Control  Diabetes history: DM2 Outpatient Diabetes medications: None, was previously taking Farxiga 10 mg QD Current orders for Inpatient glycemic control: Semglee 20 QD, Novolog 0-15 TID with meals  HgbA1C - 11.2% 178, 186 this am 12/5 CBGs - 196, 179, 226, 181 mg/dL  Inpatient Diabetes Program Recommendations:    For home:  Restart Farxiga 10 mg QD  Long discussion with pt and wife about HgbA1C of 11.2% and need for insulin at home. Pt states he's taken insulin in the past and stopped it d/t blood sugars were in normal range. Has not taken insulin in years. 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 Wants to f/u with PCP and prefers for his own PCP to start his insulin regimen. Pt agrees that he will need insulin at home, but again, prefers for PCP (Dr Elease Hashimoto) to manage the insulin and diabetes control. States he will see PCP within week of discharge. Talked about complications from high blood sugars and importance of reducing HgbA1C to < 8%. Answered all questions. Reviewed hypoglycemia s/s and treatment.   Thank you. Lorenda Peck, RD, LDN, [place] Inpatient Diabetes Coordinator [phone]

## 2022-11-19 NOTE — Plan of Care (Signed)
  Problem: Activity: Goal: Risk for activity intolerance will decrease Outcome: Progressing   Problem: Pain Managment: Goal: General experience of comfort will improve Outcome: Progressing   

## 2022-11-19 NOTE — Progress Notes (Signed)
Initial Nutrition Assessment  DOCUMENTATION CODES:   Not applicable  INTERVENTION:  - Carb Modified diet.  - Ensure Max po daily, provides 150 kcal and 30 grams of protein.    NUTRITION DIAGNOSIS:   Increased nutrient needs related to chronic illness as evidenced by estimated needs.  GOAL:   Patient will meet greater than or equal to 90% of their needs  MONITOR:   PO intake, Weight trends  REASON FOR ASSESSMENT:   Malnutrition Screening Tool    ASSESSMENT:   81 y.o. male with PMH significant of cardiac arrest, AICD placement, coronary arthrosclerosis, history of NSTEMI, drug-eluting stent placement, paroxysmal atrial fibrillation, HTn, chronic combined systolic and diastolic heart failure, osteoarthritis, cellulitis of the left lower extremity, history of anemia, obesity, OSA not on CPAP, peripheral neuropathy, DMII, polymyalgia rheumatica, urolithiasis, bladder cancer, prostate cancer, recurrent high-grade T1 bladder cancer, urethral stricture requiring multiple dilatations and recurring UTIs who presented with gross hematuria and Foley catheter dysfunction.    Patient laying in bed at time of visit. He reports a UBW of 220# and denied recent changes in weight but then stated his clothes have been fitting a little looser recently. Per EMR, no significant changes in weight over the past year. Patient endorses eating well PTA with 3 meals a day. Notes he always has breakfast but on some days when he isn't at home he may only have 2 meals/day. Wife cooks patient's meals, he notes they are both retired. Admits his appetite tends to go up and down and his current appetite remains the same. He is documented to be consuming 100% of meals but reports he's not eating as well as he typically would. Patient agreeable to try 1 Ensure Max daily.  Medications reviewed and include: Lasix, Insulin  Labs reviewed:  Creatinine 1.31 HA1C 11.2 Blood Glucose 181-226 x24 hours   NUTRITION -  FOCUSED PHYSICAL EXAM:  Flowsheet Row Most Recent Value  Orbital Region No depletion  Upper Arm Region Mild depletion  Thoracic and Lumbar Region No depletion  Buccal Region Mild depletion  Temple Region Mild depletion  Clavicle Bone Region Mild depletion  Clavicle and Acromion Bone Region Mild depletion  Scapular Bone Region Unable to assess  Dorsal Hand Moderate depletion  Patellar Region Moderate depletion  Anterior Thigh Region Moderate depletion  Posterior Calf Region Moderate depletion  Edema (RD Assessment) None  Hair Reviewed  Eyes Reviewed  Mouth Reviewed  Skin Reviewed  Nails Reviewed       Diet Order:   Diet Order             Diet Carb Modified Fluid consistency: Thin; Room service appropriate? Yes  Diet effective now                   EDUCATION NEEDS:  No education needs have been identified at this time  Skin:  Skin Assessment: Reviewed RN Assessment  Last BM:  12/3 per pt  Height:  Ht Readings from Last 1 Encounters:  11/16/22 '5\' 8"'$  (1.727 m)   Weight:  Wt Readings from Last 1 Encounters:  11/16/22 99.8 kg   Ideal Body Weight:  70 kg  BMI:  Body mass index is 33.45 kg/m.  Estimated Nutritional Needs:  Kcal:  2050-2200 kcals Protein:  100-120 grams Fluid:  >/= 2L    Samson Frederic RD, LDN For contact information, refer to [facility].

## 2022-11-19 NOTE — Progress Notes (Addendum)
S: Pt c/o SP ache.   O: Alert and oriented Abd - soft Foley in place, urine clear   CBC    Component Value Date/Time   WBC 8.7 11/19/2022 0511   RBC 4.06 (L) 11/19/2022 0511   HGB 11.4 (L) 11/19/2022 0511   HCT 37.3 (L) 11/19/2022 0511   PLT 186 11/19/2022 0511   MCV 91.9 11/19/2022 0511   MCH 28.1 11/19/2022 0511   MCHC 30.6 11/19/2022 0511   RDW 13.0 11/19/2022 0511   LYMPHSABS 2.9 11/16/2022 0832   MONOABS 1.0 11/16/2022 0832   EOSABS 0.2 11/16/2022 0832   BASOSABS 0.1 11/16/2022 0832   Lab Results  Component Value Date   CREATININE 1.31 (H) 11/19/2022   CREATININE 1.33 (H) 11/18/2022   CREATININE 1.24 11/17/2022   A/P:  -urinary retention - continue foley. Will set up for void trial, catheter exchange in office.   -gross hematuria - resolved - OK to restart anticoagulation from GU pt of view  -progressive LAD - s/p pelvic and axillary bx by IR 11/18/2022.   OK for discharge from GU pt of view when medically stable.   I called and spoke with patient's wife and went over plan. He's had SP, perineal and scrotal pain for several months as well. Likely related to the pelvic mass growth. LN biopsies pending.

## 2022-11-19 NOTE — Progress Notes (Signed)
Mobility Specialist - Progress Note   11/19/22 1027  Mobility  Activity Ambulated with assistance in hallway  Level of Assistance Standby assist, set-up cues, supervision of patient - no hands on  Assistive Device Front wheel walker  Distance Ambulated (ft) 180 ft  Range of Motion/Exercises Active  Activity Response Tolerated well  Mobility Referral Yes  $Mobility charge 1 Mobility   Pt was found in bed and agreeable to ambulate. Pt stated having a burning sensation on his testicles when getting up from bed but it going away afterwards. During ambulation stated that his testicles felt swollen all the time and that if he stood up much longer he would start having some pain. At EOS returned to bed with necessities in reach.  Ferd Hibbs Mobility Specialist

## 2022-11-19 NOTE — Progress Notes (Signed)
Foley catheter flushed with 80cc of normal saline per Dr. Junious Silk request. Small amount of mucus sediment removed. Pt tolerated well. Elzina Devera, Laurel Dimmer, RN

## 2022-11-19 NOTE — Progress Notes (Signed)
PROGRESS NOTE    Wayne [name]  PRF:[id number] DOB: 08-Nov-1941 DOA: 11/16/2022 PCP: [name], MD    Chief Complaint  Patient presents with   Hematuria    Brief Narrative:  81 y.o. male with medical history significant of cardiac arrest, AICD placement, coronary arthrosclerosis, history of NSTEMI, drug-eluting stent placement, paroxysmal atrial fibrillation on apixaban, hypertension, chronic combined systolic and diastolic heart failure, osteoarthritis, cellulitis of the left lower extremity, history of anemia, history of herpes zoster ophthalmicus, noncompliance, obesity, OSA not on CPAP, peripheral neuropathy, type 2 diabetes, polymyalgia rheumatica, urolithiasis, bladder cancer, prostate cancer, with history of brachytherapy in 2009, recurrent high-grade T1 bladder cancer, urethral stricture requiring multiple dilatations and recurring UTIs who presented to [facility] emergency department with gross hematuria and Foley catheter dysfunction.  He has been seen hematuria and has had catheter dysfunction for the past few days.  While he was in the ER, attempts to to irrigate his catheter and even exchange his catheter were performed, but these measures were unsuccessful relieving the obstruction and clotted blood.  His last PSA was undetectable.  Recent PET CT scan showed a posterior bladder wall mass concerning for either bladder or prostate CA recurrence. He has had suprapubic pain, but no flank pain or hematuria.  Denied fever, chills, rhinorrhea, sore throat, wheezing or hemoptysis.  No chest pain, palpitations, diaphoresis, PND, orthopnea, but occasionally develops pitting edema of the lower extremities.  No abdominal pain, nausea, emesis, diarrhea, constipation, melena or hematochezia. No polyuria, polydipsia, polyphagia or blurred vision.    ED course: Initial vital signs were temperature 98.5 F, pulse 96, respiration 24, BP 109/79 mmHg O2 sat 100% on room air.  The patient received  fentanyl 50 mcg IVP and 2% lidocaine jelly was used during Foley exchange attempt.  While in PACU, the patient received oxycodone 5 mg p.o. x 2 and continuous bladder irrigation was initiated   Assessment & Plan:   Principal Problem:   Obstructive uropathy Active Problems:   Hyperlipidemia   Essential hypertension   Systolic CHF, chronic (HCC)   OSA (obstructive sleep apnea)   CAD (coronary artery disease)   CKD (chronic kidney disease) stage 3, GFR 30-59 ml/min (HCC)   Paroxysmal atrial fibrillation (HCC)   Gross hematuria   Type 2 diabetes mellitus with hyperglycemia ([place])  #1 gross hematuria/obstructive uropathy -Patient initially seen in the ED, with gross hematuria draining from indwelling catheter and obstruction of catheter, ER attempted to irrigate cath and exchange however was unsuccessful in clearing clot. -Patient seen in consultation by urology and underwent cystoscopy with clot evacuation and fulguration with denuded prostatic urethra noted felt as primary source of bleeding, extensive detrusor trabeculation but no identifiable papillary or sessile mass was seen within the bladder. -Hematuria resolved with CBI. -Patient noted with progressive abdominal adenopathy on recent PET scan seen by IR status post biopsy with pathology pending. -Patient with complaints of ongoing burning in penile region and pelvic area which per urology patient has had complaints ongoing for a while. -Due to burning we will check a UA with cultures and sensitivities. -Place empirically on oxybutynin 3 times daily. -Continue empiric IV Cipro as recommended by urology. -Urology following and appreciate input and recommendations.  2.  Hyperlipidemia -Statin.  3.  Paroxysmal atrial fibrillation -Continue Coreg for rate control. -Urology okayed resumption of Eliquis which will be resumed today.  4.  Constipation -Placed on MiraLAX twice daily, Senokot-S twice daily, Dulcolax suppository  daily. -If no results will need  a enema.  5.  Hypertension -Continue Coreg,lasix.  6.  Chronic systolic heart failure -Currently euvolemic. -Continue beta-blocker, diuretics. -Outpatient follow-up with cardiology.  7.  OSA -Not on CPAP. -Outpatient follow-up.  8.  CAD -Continue Coreg, diuretics, statin.  9.  CKD stage IIIa -Stable.  10.  Type 2 diabetes mellitus with hyperglycemia -Noted to be have been on [place] in the past however noted no longer on Farxiga. -CBGs elevated. -Hemoglobin A1c 11.2 (11/17/2022). -CBG 208 this morning. -Increase Semglee to 22 units daily, SSI. -Patient noted to have discussed with diabetic coordinator on restarting insulin however at this time does not want to restart on discharge and prefers to follow-up with PCP to start insulin at that time if need be.  11.  Hyperkalemia -Status post Lokelma. -Resolved.   DVT prophylaxis: Eliquis Code Status: Full Family Communication: Updated patient.  No family at bedside. Disposition: Home when clinically improved.  Status is: Inpatient Remains inpatient appropriate because: Severity of illness   Consultants:  Urology: Dr. Lovena Neighbours 11/16/2022 Interventional radiology: Dr. Serafina Royals 11/17/2022  Procedures:  CT-guided core biopsy left iliac lymph node/ultrasound guided core biopsy left axillary lymph node per IR: Dr. Kathlene Cote 11/18/2022 Cystoscopy with clot evacuation, fulguration: Dr. Lovena Neighbours 11/16/2022  Antimicrobials:  Anti-infectives (From admission, onward)    Start     Dose/Rate Route Frequency Ordered Stop   11/16/22 1300  ciprofloxacin (CIPRO) IVPB 400 mg        400 mg 200 mL/hr over 60 Minutes Intravenous Every 12 hours 11/16/22 1245     11/16/22 1159  ciprofloxacin (CIPRO) 400 MG/200ML IVPB       Note to Pharmacy: Luane School A: cabinet override      11/16/22 1159 11/16/22 1221         Subjective: Patient laying in bed, complaining of dysuria, some testicular pain, feels his  scrotum is swollen.  No nausea, no vomiting, no chest pain, no shortness of breath.  Overall states he is not feeling too well today.  Hematuria seems to have resolved.  Foley catheter in place.  Also complaining of some bladder spasms.  No bowel movement x 4 days.  Foley catheter with clear urine.  Objective: Vitals:   11/18/22 2020 11/19/22 0423 11/19/22 0800 11/19/22 1113  BP: (!) 89/51 (!) 91/50 (!) 104/58 (!) 105/54  Pulse: 78 74 72   Resp: '20 16 16 18  '$ Temp: 98.2 F (36.8 C) 98 F (36.7 C)  97.8 F (36.6 C)  TempSrc: Oral   Oral  SpO2: 91% 94%    Weight:      Height:        Intake/Output Summary (Last 24 hours) at 11/19/2022 1325 Last data filed at 11/19/2022 1100 Gross per 24 hour  Intake 810 ml  Output 1900 ml  Net -1090 ml   Filed Weights   11/16/22 0830 11/16/22 1433  Weight: 101.2 kg 99.8 kg    Examination:  General exam: Appears calm and comfortable  Respiratory system: Clear to auscultation.  No wheezes, no crackles, no rhonchi.  Fair air movement.  Speaking in full sentences.  Respiratory effort normal. Cardiovascular system: S1 & S2 heard, RRR. No JVD, murmurs, rubs, gallops or clicks. No pedal edema. Gastrointestinal system: Abdomen is nondistended, soft and nontender. No organomegaly or masses felt. Normal bowel sounds heard. Central nervous system: Alert and oriented. No focal neurological deficits. Extremities: Symmetric 5 x 5 power. Skin: No rashes, lesions or ulcers Psychiatry: Judgement and insight appear normal. Mood & affect appropriate.  Data Reviewed: I have personally reviewed following labs and imaging studies  CBC: Recent Labs  Lab 11/16/22 0832 11/17/22 0359 11/18/22 0437 11/19/22 0511  WBC 9.9 10.5 8.8 8.7  NEUTROABS 5.6  --   --   --   HGB 13.8 12.1* 11.9* 11.4*  HCT 43.2 40.5 38.7* 37.3*  MCV 89.8 94.8 91.5 91.9  PLT 215 168 191 295    Basic Metabolic Panel: Recent Labs  Lab 11/16/22 0832 11/17/22 0359 11/18/22 0437  11/19/22 0511  NA 135 131* 135 136  K 4.8 5.5* 4.7 3.9  CL 105 102 106 103  CO2 20* 21* 22 24  GLUCOSE 305* 376* 225* 178*  BUN 19 23 26* 27*  CREATININE 1.60* 1.24 1.33* 1.31*  CALCIUM 9.3 8.6* 8.5* 8.5*    GFR: Estimated Creatinine Clearance: 52.4 mL/min (A) (by C-G formula based on SCr of 1.31 mg/dL (H)).  Liver Function Tests: Recent Labs  Lab 11/16/22 0832 11/17/22 0359 11/18/22 0437 11/19/22 0511  AST '27 17 26 17  '$ ALT '17 14 12 15  '$ ALKPHOS 68 61 54 59  BILITOT 0.6 0.7 0.9 0.6  PROT 6.7 6.0* 5.9* 5.9*  ALBUMIN 3.4* 3.0* 2.8* 3.0*    CBG: Recent Labs  Lab 11/18/22 1249 11/18/22 1637 11/18/22 2028 11/19/22 0729 11/19/22 1123  GLUCAP 179* 226* 181* 186* 208*     No results found for this or any previous visit (from the past 240 hour(s)).       Radiology Studies: CT US GUIDED BIOPSY  Result Date: 11/18/2022 CLINICAL DATA:  History of bladder carcinoma with enlarged iliac lymph nodes as well as prior imaging demonstrating enlarged lymph nodes in bilateral supraclavicular and axillary nodal stations suspicious for potential additional lymphoproliferative process/neoplasm. Request for lymph node sampling in both iliac and axillary lymph node stations. EXAM: 1. CT GUIDED CORE BIOPSY OF LEFT ILIAC LYMPH NODE 2. ULTRASOUND-GUIDED CORE BIOPSY OF LEFT AXILLARY LYMPH NODE ANESTHESIA/SEDATION: Moderate (conscious) sedation was employed during this procedure. A total of Versed 1.0 mg and Fentanyl 25 mcg was administered intravenously by radiology nursing. Moderate Sedation Time: 36 minutes. The patient's level of consciousness and vital signs were monitored continuously by radiology nursing throughout the procedure under my direct supervision. PROCEDURE: The procedure risks, benefits, and alternatives were explained to the patient. Questions regarding the procedure were encouraged and answered. The patient understands and consents to the procedure. A time-out was performed  prior to initiating the procedure. CT was performed through the pelvis in a supine position. The lower left abdominal wall was prepped with chlorhexidine in a sterile fashion, and a sterile drape was applied covering the operative field. A sterile gown and sterile gloves were used for the procedure. Local anesthesia was provided with 1% Lidocaine. Under CT guidance, a 17 gauge trocar needle was advanced to the level of a left external iliac lymph node. After confirming needle tip position, 6 separate 18 gauge core biopsy samples were obtained. Three were submitted in formalin and 3 in saline. Additional CT was performed and the 17 gauge needle removed. Ultrasound was performed of the left axilla. The skin was prepped with chlorhexidine and local anesthesia was provided with 1% lidocaine. 27 gauge core biopsy samples were obtained of an enlarged left axillary lymph node. Core biopsy samples were submitted in both formalin and saline. Additional ultrasound was performed. RADIATION DOSE REDUCTION: This exam was performed according to the departmental dose-optimization program which includes automated exposure control, adjustment of the mA and/or kV according to  patient size and/or use of iterative reconstruction technique. COMPLICATIONS: None FINDINGS: Enlarged distal left external iliac lymph node was targeted under CT guidance located lateral to the bladder and measuring approximately 2.5 cm in greatest diameter. Solid core biopsy samples were obtained. Multiple mildly enlarged left-sided axillary lymph nodes were identified by ultrasound. Two separate adjacent lymph nodes were sampled under ultrasound guidance. IMPRESSION: 1. CT-guided core biopsy of enlarged distal left external iliac lymph node. 2. Ultrasound-guided core biopsy performed of an enlarged left axillary lymph node. Electronically Signed   By: Aletta Edouard M.D.   On: 11/18/2022 14:27   CT ABDOMINAL MASS BIOPSY  Result Date: 11/18/2022 CLINICAL  DATA:  History of bladder carcinoma with enlarged iliac lymph nodes as well as prior imaging demonstrating enlarged lymph nodes in bilateral supraclavicular and axillary nodal stations suspicious for potential additional lymphoproliferative process/neoplasm. Request for lymph node sampling in both iliac and axillary lymph node stations. EXAM: 1. CT GUIDED CORE BIOPSY OF LEFT ILIAC LYMPH NODE 2. ULTRASOUND-GUIDED CORE BIOPSY OF LEFT AXILLARY LYMPH NODE ANESTHESIA/SEDATION: Moderate (conscious) sedation was employed during this procedure. A total of Versed 1.0 mg and Fentanyl 25 mcg was administered intravenously by radiology nursing. Moderate Sedation Time: 36 minutes. The patient's level of consciousness and vital signs were monitored continuously by radiology nursing throughout the procedure under my direct supervision. PROCEDURE: The procedure risks, benefits, and alternatives were explained to the patient. Questions regarding the procedure were encouraged and answered. The patient understands and consents to the procedure. A time-out was performed prior to initiating the procedure. CT was performed through the pelvis in a supine position. The lower left abdominal wall was prepped with chlorhexidine in a sterile fashion, and a sterile drape was applied covering the operative field. A sterile gown and sterile gloves were used for the procedure. Local anesthesia was provided with 1% Lidocaine. Under CT guidance, a 17 gauge trocar needle was advanced to the level of a left external iliac lymph node. After confirming needle tip position, 6 separate 18 gauge core biopsy samples were obtained. Three were submitted in formalin and 3 in saline. Additional CT was performed and the 17 gauge needle removed. Ultrasound was performed of the left axilla. The skin was prepped with chlorhexidine and local anesthesia was provided with 1% lidocaine. 28 gauge core biopsy samples were obtained of an enlarged left axillary lymph node.  Core biopsy samples were submitted in both formalin and saline. Additional ultrasound was performed. RADIATION DOSE REDUCTION: This exam was performed according to the departmental dose-optimization program which includes automated exposure control, adjustment of the mA and/or kV according to patient size and/or use of iterative reconstruction technique. COMPLICATIONS: None FINDINGS: Enlarged distal left external iliac lymph node was targeted under CT guidance located lateral to the bladder and measuring approximately 2.5 cm in greatest diameter. Solid core biopsy samples were obtained. Multiple mildly enlarged left-sided axillary lymph nodes were identified by ultrasound. Two separate adjacent lymph nodes were sampled under ultrasound guidance. IMPRESSION: 1. CT-guided core biopsy of enlarged distal left external iliac lymph node. 2. Ultrasound-guided core biopsy performed of an enlarged left axillary lymph node. Electronically Signed   By: Aletta Edouard M.D.   On: 11/18/2022 14:27        Scheduled Meds:  apixaban  5 mg Oral BID   carvedilol  12.5 mg Oral BID WC   Chlorhexidine Gluconate Cloth  6 each Topical Daily   furosemide  20 mg Oral QODAY   And   furosemide  40 mg Oral QODAY   insulin aspart  0-15 Units Subcutaneous TID WC   insulin glargine-yfgn  20 Units Subcutaneous Daily   rosuvastatin  40 mg Oral q AM   Continuous Infusions:  sodium chloride 250 mL (11/17/22 2133)   ciprofloxacin 400 mg (11/19/22 0807)   sodium chloride irrigation       LOS: 2 days    Time spent: 35 minutes    Irine Seal, MD [facility]   To contact the attending provider between 7A-7P or the covering provider during after hours 7P-7A, please log into the web site [email or web address] and access using universal [place] password for that web site. If you do not have the password, please call the hospital operator.  11/19/2022, 1:25 PM

## 2022-11-20 ENCOUNTER — Other Ambulatory Visit (HOSPITAL_COMMUNITY): Payer: Medicare Other

## 2022-11-20 DIAGNOSIS — I5022 Chronic systolic (congestive) heart failure: Secondary | ICD-10-CM | POA: Diagnosis not present

## 2022-11-20 DIAGNOSIS — N390 Urinary tract infection, site not specified: Secondary | ICD-10-CM | POA: Diagnosis present

## 2022-11-20 DIAGNOSIS — N1831 Chronic kidney disease, stage 3a: Secondary | ICD-10-CM | POA: Diagnosis not present

## 2022-11-20 DIAGNOSIS — E785 Hyperlipidemia, unspecified: Secondary | ICD-10-CM | POA: Diagnosis not present

## 2022-11-20 DIAGNOSIS — N139 Obstructive and reflux uropathy, unspecified: Secondary | ICD-10-CM | POA: Diagnosis not present

## 2022-11-20 LAB — BASIC METABOLIC PANEL
Anion gap: 11 (ref 5–15)
BUN: 25 mg/dL — ABNORMAL HIGH (ref 8–23)
CO2: 24 mmol/L (ref 22–32)
Calcium: 8.6 mg/dL — ABNORMAL LOW (ref 8.9–10.3)
Chloride: 101 mmol/L (ref 98–111)
Creatinine, Ser: 1.29 mg/dL — ABNORMAL HIGH (ref 0.61–1.24)
GFR, Estimated: 56 mL/min — ABNORMAL LOW (ref >60)
Glucose, Bld: 190 mg/dL — ABNORMAL HIGH (ref 70–99)
Potassium: 3.9 mmol/L (ref 3.5–5.1)
Sodium: 136 mmol/L (ref 135–145)

## 2022-11-20 LAB — CBC
HCT: 37.9 % — ABNORMAL LOW (ref 39.0–52.0)
Hemoglobin: 11.7 g/dL — ABNORMAL LOW (ref 13.0–17.0)
MCH: 27.9 pg (ref 26.0–34.0)
MCHC: 30.9 g/dL (ref 30.0–36.0)
MCV: 90.2 fL (ref 80.0–100.0)
Platelets: 194 10*3/uL (ref 150–400)
RBC: 4.2 MIL/uL — ABNORMAL LOW (ref 4.22–5.81)
RDW: 12.9 % (ref 11.5–15.5)
WBC: 8.7 10*3/uL (ref 4.0–10.5)
nRBC: 0 % (ref 0.0–0.2)

## 2022-11-20 LAB — GLUCOSE, CAPILLARY
Glucose-Capillary: 245 mg/dL — ABNORMAL HIGH (ref 70–99)
Glucose-Capillary: 234 mg/dL — ABNORMAL HIGH (ref 70–99)
Glucose-Capillary: 218 mg/dL — ABNORMAL HIGH (ref 70–99)
Glucose-Capillary: 216 mg/dL — ABNORMAL HIGH (ref 70–99)

## 2022-11-20 LAB — URINE CULTURE: Culture: 80000 — AB

## 2022-11-20 MED ORDER — INSULIN GLARGINE-YFGN 100 UNIT/ML ~~LOC~~ SOLN
26.0000 [IU] | Freq: Every day | SUBCUTANEOUS | Status: DC
  Administered 2022-11-21 – 2022-11-22 (×2): 26 [IU] via SUBCUTANEOUS
  Filled 2022-11-20 (×3): qty 0.26

## 2022-11-20 MED ORDER — INSULIN GLARGINE-YFGN 100 UNIT/ML ~~LOC~~ SOLN
4.0000 [IU] | Freq: Once | SUBCUTANEOUS | Status: AC
  Administered 2022-11-20: 4 [IU] via SUBCUTANEOUS
  Filled 2022-11-20: qty 0.04

## 2022-11-20 MED ORDER — SORBITOL 70 % SOLN
30.0000 mL | Status: AC
  Administered 2022-11-20 (×2): 30 mL via ORAL
  Filled 2022-11-20 (×2): qty 30

## 2022-11-20 NOTE — Progress Notes (Signed)
Mobility Specialist - Progress Note   11/20/22 1226  Mobility  Activity Ambulated with assistance in hallway  Level of Assistance Standby assist, set-up cues, supervision of patient - no hands on  Assistive Device Front wheel walker  Distance Ambulated (ft) 80 ft  Range of Motion/Exercises Active  Activity Response Tolerated well  Mobility Referral Yes  $Mobility charge 1 Mobility   Pt was found in bed and agreeable to ambulate. Stated that he is still experiencing pain on his testicles. Pt at [place] returned to [facility] and both RN and NT notified.  Ferd Hibbs Mobility Specialist

## 2022-11-20 NOTE — Progress Notes (Signed)
PROGRESS NOTE    Wayne [name]  UEA:[id number] DOB: September 12, 1941 DOA: 11/16/2022 PCP: [name], MD    Chief Complaint  Patient presents with   Hematuria    Brief Narrative:  81 y.o. male with medical history significant of cardiac arrest, AICD placement, coronary arthrosclerosis, history of NSTEMI, drug-eluting stent placement, paroxysmal atrial fibrillation on apixaban, hypertension, chronic combined systolic and diastolic heart failure, osteoarthritis, cellulitis of the left lower extremity, history of anemia, history of herpes zoster ophthalmicus, noncompliance, obesity, OSA not on CPAP, peripheral neuropathy, type 2 diabetes, polymyalgia rheumatica, urolithiasis, bladder cancer, prostate cancer, with history of brachytherapy in 2009, recurrent high-grade T1 bladder cancer, urethral stricture requiring multiple dilatations and recurring UTIs who presented to [facility] emergency department with gross hematuria and Foley catheter dysfunction.  He has been seen hematuria and has had catheter dysfunction for the past few days.  While he was in the ER, attempts to to irrigate his catheter and even exchange his catheter were performed, but these measures were unsuccessful relieving the obstruction and clotted blood.  His last PSA was undetectable.  Recent PET CT scan showed a posterior bladder wall mass concerning for either bladder or prostate CA recurrence. He has had suprapubic pain, but no flank pain or hematuria.  Denied fever, chills, rhinorrhea, sore throat, wheezing or hemoptysis.  No chest pain, palpitations, diaphoresis, PND, orthopnea, but occasionally develops pitting edema of the lower extremities.  No abdominal pain, nausea, emesis, diarrhea, constipation, melena or hematochezia. No polyuria, polydipsia, polyphagia or blurred vision.    ED course: Initial vital signs were temperature 98.5 F, pulse 96, respiration 24, BP 109/79 mmHg O2 sat 100% on room air.  The patient received  fentanyl 50 mcg IVP and 2% lidocaine jelly was used during Foley exchange attempt.  While in PACU, the patient received oxycodone 5 mg p.o. x 2 and continuous bladder irrigation was initiated   Assessment & Plan:   Principal Problem:   Obstructive uropathy Active Problems:   Hyperlipidemia   Essential hypertension   Systolic CHF, chronic (HCC)   OSA (obstructive sleep apnea)   CAD (coronary artery disease)   CKD (chronic kidney disease) stage 3, GFR 30-59 ml/min (HCC)   Paroxysmal atrial fibrillation (HCC)   Gross hematuria   Type 2 diabetes mellitus with hyperglycemia (HCC)   UTI (urinary tract infection) Probable  #1 gross hematuria/obstructive uropathy -Patient initially seen in the ED, with gross hematuria draining from indwelling catheter and obstruction of catheter, ER attempted to irrigate cath and exchange however was unsuccessful in clearing clot. -Patient seen in consultation by urology and underwent cystoscopy with clot evacuation and fulguration with denuded prostatic urethra noted felt as primary source of bleeding, extensive detrusor trabeculation but no identifiable papillary or sessile mass was seen within the bladder. -Hematuria improved with CBI. -Patient noted with progressive abdominal adenopathy on recent PET scan seen by IR status post biopsy with pathology pending. -Patient with complaints of ongoing burning in penile region and pelvic area which per urology patient has had complaints ongoing for a while. -Due to burning urinalysis was done which was concerning for UTI, urine culture is pending. -Continue IV ciprofloxacin pending urine cultures. -Patient started on oxybutynin 3 times daily which he states has helped with his pelvic pain. -Urology following and appreciate input and recommendations.  2.  Hyperlipidemia -Continue statin.  3.  Paroxysmal atrial fibrillation -Coreg for rate control. -Eliquis for anticoagulation.   4.  Constipation -Patient  still with no bowel  movement despite current bowel regimen.   -Continue MiraLAX twice daily, Senokot-S twice daily. -Patient refused Dulcolax suppository. -Sorbitol x 2 doses. -If no results will need a enema.  5.  Hypertension -Controlled on current regimen of Coreg, Lasix.   6.  Chronic systolic heart failure -Currently euvolemic. -Continue beta-blocker, diuretics. -Outpatient follow-up with cardiology.  7.  OSA -Not on CPAP. -Outpatient follow-up.  8.  CAD -Stable.   -Continue diuretics, statin, Coreg.   9.  CKD stage IIIa -Stable.  10.  Type 2 diabetes mellitus with hyperglycemia -Noted to be have been on [place] in the past however noted no longer on Farxiga. -CBGs elevated. -Hemoglobin A1c 11.2 (11/17/2022). -CBG 245 this morning. -Increase Semglee to 26 units daily, SSI. -Patient noted to have discussed with diabetic coordinator on restarting insulin however at this time does not want to restart on discharge and prefers to follow-up with PCP to start insulin at that time if need be.  11.  Hyperkalemia -Status post Lokelma. -Resolved.   DVT prophylaxis: Eliquis Code Status: Full Family Communication: Updated patient and wife at bedside. Disposition: Home when clinically improved.  Status is: Inpatient Remains inpatient appropriate because: Severity of illness   Consultants:  Urology: Dr. Lovena Neighbours 11/16/2022 Interventional radiology: Dr. Serafina Royals 11/17/2022  Procedures:  CT-guided core biopsy left iliac lymph node/ultrasound guided core biopsy left axillary lymph node per IR: Dr. Kathlene Cote 11/18/2022 Cystoscopy with clot evacuation, fulguration: Dr. Lovena Neighbours 11/16/2022  Antimicrobials:  Anti-infectives (From admission, onward)    Start     Dose/Rate Route Frequency Ordered Stop   11/16/22 1300  ciprofloxacin (CIPRO) IVPB 400 mg        400 mg 200 mL/hr over 60 Minutes Intravenous Every 12 hours 11/16/22 1245     11/16/22 1159  ciprofloxacin (CIPRO) 400 MG/200ML  IVPB       Note to Pharmacy: Luane [employer] A: cabinet override      11/16/22 1159 11/16/22 1221         Subjective: Sitting up in bed.  Feels some improvement with pelvic pain after being started on oxybutynin.  Still with some burning but improved.  Denies any chest pain.  No shortness of breath.  Foley catheter in place with some pinkish urine noted.  Wife at bedside.  Complains of constipation.    Objective: Vitals:   11/19/22 2014 11/20/22 0540 11/20/22 0907 11/20/22 1221  BP: (!) 114/58 (!) 102/53 106/62 128/69  Pulse: 79 71 80 79  Resp: '18 17 16   '$ Temp: 98 F (36.7 C) 98.7 F (37.1 C) 98.5 F (36.9 C) 97.7 F (36.5 C)  TempSrc:  Oral Oral Oral  SpO2: 93% 92% 95% 98%  Weight:      Height:        Intake/Output Summary (Last 24 hours) at 11/20/2022 1339 Last data filed at 11/20/2022 1100 Gross per 24 hour  Intake 1132.09 ml  Output 2775 ml  Net -1642.91 ml    Filed Weights   11/16/22 0830 11/16/22 1433  Weight: 101.2 kg 99.8 kg    Examination:  General exam: NAD. Respiratory system: Lungs clear to auscultation bilaterally.  No wheezes, no crackles, no rhonchi.  Fair air movement.  Speaking in full sentences.   Cardiovascular system: Regular rate rhythm no murmurs rubs or gallops.  No JVD.  No lower extremity edema.  Gastrointestinal system: Abdomen is soft, nontender, nondistended, positive bowel sounds.  No rebound.  No guarding.   Central nervous system: Alert and oriented. No focal neurological deficits.  Extremities: Symmetric 5 x 5 power. Skin: No rashes, lesions or ulcers Psychiatry: Judgement and insight appear normal. Mood & affect appropriate.     Data Reviewed: I have personally reviewed following labs and imaging studies  CBC: Recent Labs  Lab 11/16/22 0832 11/17/22 0359 11/18/22 0437 11/19/22 0511 11/20/22 0535  WBC 9.9 10.5 8.8 8.7 8.7  NEUTROABS 5.6  --   --   --   --   HGB 13.8 12.1* 11.9* 11.4* 11.7*  HCT 43.2 40.5 38.7* 37.3* 37.9*   MCV 89.8 94.8 91.5 91.9 90.2  PLT 215 168 191 186 194     Basic Metabolic Panel: Recent Labs  Lab 11/16/22 0832 11/17/22 0359 11/18/22 0437 11/19/22 0511 11/20/22 0535  NA 135 131* 135 136 136  K 4.8 5.5* 4.7 3.9 3.9  CL 105 102 106 103 101  CO2 20* 21* '22 24 24  '$ GLUCOSE 305* 376* 225* 178* 190*  BUN 19 23 26* 27* 25*  CREATININE 1.60* 1.24 1.33* 1.31* 1.29*  CALCIUM 9.3 8.6* 8.5* 8.5* 8.6*     GFR: Estimated Creatinine Clearance: 53.2 mL/min (A) (by C-G formula based on SCr of 1.29 mg/dL (H)).  Liver Function Tests: Recent Labs  Lab 11/16/22 0832 11/17/22 0359 11/18/22 0437 11/19/22 0511  AST '27 17 26 17  '$ ALT '17 14 12 15  '$ ALKPHOS 68 61 54 59  BILITOT 0.6 0.7 0.9 0.6  PROT 6.7 6.0* 5.9* 5.9*  ALBUMIN 3.4* 3.0* 2.8* 3.0*     CBG: Recent Labs  Lab 11/19/22 1123 11/19/22 1629 11/19/22 2055 11/20/22 0745 11/20/22 1144  GLUCAP 208* 184* 208* 245* 234*      No results found for this or any previous visit (from the past 240 hour(s)).       Radiology Studies: No results found.      Scheduled Meds:  apixaban  5 mg Oral BID   bisacodyl  10 mg Rectal Daily   carvedilol  12.5 mg Oral BID WC   Chlorhexidine Gluconate Cloth  6 each Topical Daily   furosemide  20 mg Oral QODAY   And   furosemide  40 mg Oral QODAY   insulin aspart  0-15 Units Subcutaneous TID WC   insulin glargine-yfgn  22 Units Subcutaneous Daily   oxybutynin  5 mg Oral TID   polyethylene glycol  17 g Oral BID   Ensure Max Protein  11 oz Oral Daily   rosuvastatin  40 mg Oral q AM   senna-docusate  1 tablet Oral BID   sorbitol  30 mL Oral Q2H   Continuous Infusions:  sodium chloride 250 mL (11/17/22 2133)   ciprofloxacin 400 mg (11/20/22 0912)   sodium chloride irrigation       LOS: 3 days    Time spent: 35 minutes    Irine Seal, MD [facility]   To contact the attending provider between 7A-7P or the covering provider during after hours 7P-7A, please  log into the web site [email or web address] and access using universal [place] password for that web site. If you do not have the password, please call the hospital operator.  11/20/2022, 1:39 PM

## 2022-11-20 NOTE — Progress Notes (Signed)
S: Pt reports SP pressure improved with "bladder spasm medicine". Urine turned red.   O: Vitals:   11/20/22 0907 11/20/22 1221  BP: 106/62 128/69  Pulse: 80 79  Resp: 16   Temp: 98.5 F (36.9 C) 97.7 F (36.5 C)  SpO2: 95% 98%   PE: Eating breakfast  Abd - soft, NT, no SP mass or pain on palpation GU - foley in place - urine pink to light red in tubing. No clots. Scrotum - normal without swelling or cellulitis. Testicles palpably normal.  Perineum palpably normal.  Ext - no calf pain or swelling   CBC    Component Value Date/Time   WBC 8.7 11/20/2022 0535   RBC 4.20 (L) 11/20/2022 0535   HGB 11.7 (L) 11/20/2022 0535   HCT 37.9 (L) 11/20/2022 0535   PLT 194 11/20/2022 0535   MCV 90.2 11/20/2022 0535   MCH 27.9 11/20/2022 0535   MCHC 30.9 11/20/2022 0535   RDW 12.9 11/20/2022 0535   LYMPHSABS 2.9 11/16/2022 0832   MONOABS 1.0 11/16/2022 0832   EOSABS 0.2 11/16/2022 0832   BASOSABS 0.1 11/16/2022 0832   Lab Results  Component Value Date   CREATININE 1.29 (H) 11/20/2022   CREATININE 1.31 (H) 11/19/2022   CREATININE 1.33 (H) 11/18/2022   A/p:  Gross hematuria - not significant at this pt. Likey from prostate  LAD - bx pending   Pelvic pain - seems stable to improved . On cipro. Cx pending.

## 2022-11-21 DIAGNOSIS — I5022 Chronic systolic (congestive) heart failure: Secondary | ICD-10-CM | POA: Diagnosis not present

## 2022-11-21 DIAGNOSIS — E785 Hyperlipidemia, unspecified: Secondary | ICD-10-CM | POA: Diagnosis not present

## 2022-11-21 DIAGNOSIS — N1831 Chronic kidney disease, stage 3a: Secondary | ICD-10-CM | POA: Diagnosis not present

## 2022-11-21 DIAGNOSIS — B3749 Other urogenital candidiasis: Secondary | ICD-10-CM | POA: Diagnosis present

## 2022-11-21 DIAGNOSIS — N139 Obstructive and reflux uropathy, unspecified: Secondary | ICD-10-CM | POA: Diagnosis not present

## 2022-11-21 LAB — BASIC METABOLIC PANEL
Anion gap: 10 (ref 5–15)
BUN: 22 mg/dL (ref 8–23)
CO2: 23 mmol/L (ref 22–32)
Calcium: 8.6 mg/dL — ABNORMAL LOW (ref 8.9–10.3)
Chloride: 99 mmol/L (ref 98–111)
Creatinine, Ser: 1.33 mg/dL — ABNORMAL HIGH (ref 0.61–1.24)
GFR, Estimated: 54 mL/min — ABNORMAL LOW (ref >60)
Glucose, Bld: 160 mg/dL — ABNORMAL HIGH (ref 70–99)
Potassium: 3.8 mmol/L (ref 3.5–5.1)
Sodium: 132 mmol/L — ABNORMAL LOW (ref 135–145)

## 2022-11-21 LAB — GLUCOSE, CAPILLARY
Glucose-Capillary: 154 mg/dL — ABNORMAL HIGH (ref 70–99)
Glucose-Capillary: 211 mg/dL — ABNORMAL HIGH (ref 70–99)
Glucose-Capillary: 163 mg/dL — ABNORMAL HIGH (ref 70–99)
Glucose-Capillary: 145 mg/dL — ABNORMAL HIGH (ref 70–99)

## 2022-11-21 LAB — CBC WITH DIFFERENTIAL/PLATELET
Abs Immature Granulocytes: 0.09 10*3/uL — ABNORMAL HIGH (ref 0.00–0.07)
Basophils Absolute: 0.1 10*3/uL (ref 0.0–0.1)
Basophils Relative: 1 %
Eosinophils Absolute: 0.3 10*3/uL (ref 0.0–0.5)
Eosinophils Relative: 3 %
HCT: 39.6 % (ref 39.0–52.0)
Hemoglobin: 12.1 g/dL — ABNORMAL LOW (ref 13.0–17.0)
Immature Granulocytes: 1 %
Lymphocytes Relative: 26 %
Lymphs Abs: 2.3 10*3/uL (ref 0.7–4.0)
MCH: 27.7 pg (ref 26.0–34.0)
MCHC: 30.6 g/dL (ref 30.0–36.0)
MCV: 90.6 fL (ref 80.0–100.0)
Monocytes Absolute: 0.8 10*3/uL (ref 0.1–1.0)
Monocytes Relative: 9 %
Neutro Abs: 5.3 10*3/uL (ref 1.7–7.7)
Neutrophils Relative %: 60 %
Platelets: 205 10*3/uL (ref 150–400)
RBC: 4.37 MIL/uL (ref 4.22–5.81)
RDW: 12.7 % (ref 11.5–15.5)
WBC: 8.9 10*3/uL (ref 4.0–10.5)
nRBC: 0 % (ref 0.0–0.2)

## 2022-11-21 MED ORDER — ENOXAPARIN (LOVENOX) PATIENT EDUCATION KIT
PACK | Freq: Once | Status: DC
  Filled 2022-11-21: qty 1

## 2022-11-21 MED ORDER — ENOXAPARIN SODIUM 100 MG/ML IJ SOSY
1.0000 mg/kg | PREFILLED_SYRINGE | Freq: Two times a day (BID) | INTRAMUSCULAR | Status: DC
  Administered 2022-11-21 – 2022-11-22 (×3): 100 mg via SUBCUTANEOUS
  Filled 2022-11-21 (×4): qty 1

## 2022-11-21 MED ORDER — POLYETHYLENE GLYCOL 3350 17 G PO PACK
17.0000 g | PACK | Freq: Every day | ORAL | Status: DC
  Administered 2022-11-22: 17 g via ORAL
  Filled 2022-11-21: qty 1

## 2022-11-21 MED ORDER — FLUCONAZOLE IN SODIUM CHLORIDE 200-0.9 MG/100ML-% IV SOLN
200.0000 mg | INTRAVENOUS | Status: DC
  Administered 2022-11-21 – 2022-11-22 (×2): 200 mg via INTRAVENOUS
  Filled 2022-11-21 (×2): qty 100

## 2022-11-21 NOTE — Progress Notes (Signed)
PROGRESS NOTE    Wayne [name]  KNL:[id number] DOB: 1941-02-15 DOA: 11/16/2022 PCP: [name], MD    Chief Complaint  Patient presents with   Hematuria    Brief Narrative:  81 y.o. male with medical history significant of cardiac arrest, AICD placement, coronary arthrosclerosis, history of NSTEMI, drug-eluting stent placement, paroxysmal atrial fibrillation on apixaban, hypertension, chronic combined systolic and diastolic heart failure, osteoarthritis, cellulitis of the left lower extremity, history of anemia, history of herpes zoster ophthalmicus, noncompliance, obesity, OSA not on CPAP, peripheral neuropathy, type 2 diabetes, polymyalgia rheumatica, urolithiasis, bladder cancer, prostate cancer, with history of brachytherapy in 2009, recurrent high-grade T1 bladder cancer, urethral stricture requiring multiple dilatations and recurring UTIs who presented to [facility] emergency department with gross hematuria and Foley catheter dysfunction.  He has been seen hematuria and has had catheter dysfunction for the past few days.  While he was in the ER, attempts to to irrigate his catheter and even exchange his catheter were performed, but these measures were unsuccessful relieving the obstruction and clotted blood.  His last PSA was undetectable.  Recent PET CT scan showed a posterior bladder wall mass concerning for either bladder or prostate CA recurrence. He has had suprapubic pain, but no flank pain or hematuria.  Denied fever, chills, rhinorrhea, sore throat, wheezing or hemoptysis.  No chest pain, palpitations, diaphoresis, PND, orthopnea, but occasionally develops pitting edema of the lower extremities.  No abdominal pain, nausea, emesis, diarrhea, constipation, melena or hematochezia. No polyuria, polydipsia, polyphagia or blurred vision.    ED course: Initial vital signs were temperature 98.5 F, pulse 96, respiration 24, BP 109/79 mmHg O2 sat 100% on room air.  The patient received  fentanyl 50 mcg IVP and 2% lidocaine jelly was used during Foley exchange attempt.  While in PACU, the patient received oxycodone 5 mg p.o. x 2 and continuous bladder irrigation was initiated   Assessment & Plan:   Principal Problem:   Obstructive uropathy Active Problems:   Hyperlipidemia   Essential hypertension   Systolic CHF, chronic (HCC)   OSA (obstructive sleep apnea)   CAD (coronary artery disease)   CKD (chronic kidney disease) stage 3, GFR 30-59 ml/min (HCC)   Paroxysmal atrial fibrillation (HCC)   Gross hematuria   Type 2 diabetes mellitus with hyperglycemia (HCC)   UTI (urinary tract infection) Probable   Candiduria  #1 gross hematuria/obstructive uropathy -Patient initially seen in the ED, with gross hematuria draining from indwelling catheter and obstruction of catheter, ER attempted to irrigate cath and exchange however was unsuccessful in clearing clot. -Patient seen in consultation by urology and underwent cystoscopy with clot evacuation and fulguration with denuded prostatic urethra noted felt as primary source of bleeding, extensive detrusor trabeculation but no identifiable papillary or sessile mass was seen within the bladder. -Hematuria improved with CBI. -Patient noted with progressive abdominal adenopathy on recent PET scan seen by IR status post biopsy with pathology pending. -Patient with complaints of ongoing burning in penile region and pelvic area which per urology patient has had complaints ongoing for a while. -Due to burning urinalysis was done which was concerning for UTI, urine culture is pending. -Urine cultures with 80,000 colonies of yeast.   -Discontinue IV ciprofloxacin and place on IV fluconazole. -Patient started on oxybutynin 3 times daily which he states has helped with his pelvic pain. -Urology following and appreciate input and recommendations.  2.  Candiduria -Patient with urinary complaints of dysuria in the penile region, urinalysis  done was concerning for UTI. -Urine cultures with 80,000 colonies of yeast. -Patient with indwelling Foley catheter, has had multiple manipulations/cystoscopies, prior history of grade 1 prostate cancer status post brachytherapy 2009, recurrent high-grade T1 bladder cancer, urethral stricture disease requiring multiple dilatations and history of recurrent UTIs. -Discontinue IV ciprofloxacin and place on IV fluconazole and treat for 10 to 14 days.  3.  Hyperlipidemia -Statin.    4.  Paroxysmal atrial fibrillation -Coreg for rate control. -Due to possible interaction with fluconazole and Eliquis, will discontinue Eliquis and placed on Lovenox while on fluconazole.  Once fluconazole course has been completed may resume back on home regimen Eliquis.   5.  Constipation -Patient with multiple bowel movements yesterday and this morning on current bowel regimen of MiraLAX twice daily, Senokot-S twice daily, Dulcolax suppositories.   -Status post sorbitol x 2 doses.   -Discontinue Dulcolax suppository.   -Change MiraLAX to daily and continue Senokot-S twice daily.   6.  Hypertension -Controlled on current regimen of Coreg, Lasix.   7.  Chronic systolic heart failure -Euvolemic.   -Continue statin, diuretics, beta-blocker.   -Outpatient follow-up with cardiology.    8.  OSA -Not on CPAP. -Outpatient follow-up.  9.  CAD -Stable.   -Continue diuretics, statin, Coreg.   10.  CKD stage IIIa -Stable.  11.  Type 2 diabetes mellitus with hyperglycemia -Noted to be have been on [place] in the past however noted no longer on Farxiga. -CBGs elevated. -Hemoglobin A1c 11.2 (11/17/2022). -CBG 154 this morning. -Continue Semglee to 26 units daily, SSI. -Patient noted to have discussed with diabetic coordinator on restarting insulin however at this time does not want to restart on discharge and prefers to follow-up with PCP to start insulin at that time if need be.  12.  Hyperkalemia -Status post  Lokelma. -Resolved.   DVT prophylaxis: Eliquis >>> Lovenox Code Status: Full Family Communication: Updated patient and granddaughter at bedside. Disposition: Home when clinically improved.  Status is: Inpatient Remains inpatient appropriate because: Severity of illness   Consultants:  Urology: Dr. Lovena Neighbours 11/16/2022 Interventional radiology: Dr. Serafina Royals 11/17/2022  Procedures:  CT-guided core biopsy left iliac lymph node/ultrasound guided core biopsy left axillary lymph node per IR: Dr. Kathlene Cote 11/18/2022 Cystoscopy with clot evacuation, fulguration: Dr. Lovena Neighbours 11/16/2022  Antimicrobials:  Anti-infectives (From admission, onward)    Start     Dose/Rate Route Frequency Ordered Stop   11/21/22 1200  fluconazole (DIFLUCAN) IVPB 200 mg        200 mg 100 mL/hr over 60 Minutes Intravenous Every 24 hours 11/21/22 0909 12/05/22 1159   11/16/22 1300  ciprofloxacin (CIPRO) IVPB 400 mg        400 mg 200 mL/hr over 60 Minutes Intravenous Every 12 hours 11/16/22 1245     11/16/22 1159  ciprofloxacin (CIPRO) 400 MG/200ML IVPB       Note to Pharmacy: Luane School A: cabinet override      11/16/22 1159 11/16/22 1221         Subjective: Laying in bed.  Granddaughter at bedside.  Denies any chest pain.  No shortness of breath.  Some improvement with abdominal pain with oxybutynin however required some pain medication in between doses of oxybutynin.  Still with some burning.  Stated had multiple good bowel movements.    Objective: Vitals:   11/20/22 2040 11/21/22 0502 11/21/22 0743 11/21/22 1256  BP: (!) 106/57 (!) 97/50 (!) 100/52 (!) 99/52  Pulse: 77 70 74 72  Resp: 18 18  16  Temp: 98.1 F (36.7 C) 97.7 F (36.5 C)  98.1 F (36.7 C)  TempSrc: Oral Oral  Oral  SpO2: 90% 92%  90%  Weight:      Height:        Intake/Output Summary (Last 24 hours) at 11/21/2022 1331 Last data filed at 11/21/2022 1208 Gross per 24 hour  Intake 470 ml  Output 850 ml  Net -380 ml    Filed Weights    11/16/22 0830 11/16/22 1433  Weight: 101.2 kg 99.8 kg    Examination:  General exam: NAD. Respiratory system: CTA B.  No wheezes, no crackles, no rhonchi.  Fair air movement.  Speaking in full sentences.  Cardiovascular system: RRR no murmurs rubs or gallops.  No JVD.  No lower extremity edema.  Gastrointestinal system: Abdomen is soft, nontender, nondistended, positive bowel sounds.  No rebound.  No guarding.   Central nervous system: Alert and oriented. No focal neurological deficits. Extremities: Symmetric 5 x 5 power. Skin: No rashes, lesions or ulcers Psychiatry: Judgement and insight appear normal. Mood & affect appropriate.     Data Reviewed: I have personally reviewed following labs and imaging studies  CBC: Recent Labs  Lab 11/16/22 0832 11/17/22 0359 11/18/22 0437 11/19/22 0511 11/20/22 0535 11/21/22 0547  WBC 9.9 10.5 8.8 8.7 8.7 8.9  NEUTROABS 5.6  --   --   --   --  5.3  HGB 13.8 12.1* 11.9* 11.4* 11.7* 12.1*  HCT 43.2 40.5 38.7* 37.3* 37.9* 39.6  MCV 89.8 94.8 91.5 91.9 90.2 90.6  PLT 215 168 191 186 194 205     Basic Metabolic Panel: Recent Labs  Lab 11/17/22 0359 11/18/22 0437 11/19/22 0511 11/20/22 0535 11/21/22 0547  NA 131* 135 136 136 132*  K 5.5* 4.7 3.9 3.9 3.8  CL 102 106 103 101 99  CO2 21* '22 24 24 23  '$ GLUCOSE 376* 225* 178* 190* 160*  BUN 23 26* 27* 25* 22  CREATININE 1.24 1.33* 1.31* 1.29* 1.33*  CALCIUM 8.6* 8.5* 8.5* 8.6* 8.6*     GFR: Estimated Creatinine Clearance: 51.6 mL/min (A) (by C-G formula based on SCr of 1.33 mg/dL (H)).  Liver Function Tests: Recent Labs  Lab 11/16/22 0832 11/17/22 0359 11/18/22 0437 11/19/22 0511  AST '27 17 26 17  '$ ALT '17 14 12 15  '$ ALKPHOS 68 61 54 59  BILITOT 0.6 0.7 0.9 0.6  PROT 6.7 6.0* 5.9* 5.9*  ALBUMIN 3.4* 3.0* 2.8* 3.0*     CBG: Recent Labs  Lab 11/20/22 1144 11/20/22 1608 11/20/22 2044 11/21/22 0737 11/21/22 1141  GLUCAP 234* 218* 216* 154* 211*      Recent  Results (from the past 240 hour(s))  Urine Culture     Status: Abnormal   Collection Time: 11/19/22  1:30 PM   Specimen: Urine, Catheterized  Result Value Ref Range Status   Specimen Description   Final    URINE, CATHETERIZED Performed at [facility], [place] [address] [place], [place] [address]    Special Requests   Final    NONE Performed at [facility], [place] [address], [place], [place] [address]    Culture 80,000 COLONIES/mL YEAST (A)  Final   Report Status 11/20/2022 FINAL  Final         Radiology Studies: No results found.      Scheduled Meds:  bisacodyl  10 mg Rectal Daily   carvedilol  12.5 mg Oral BID WC   Chlorhexidine  Gluconate Cloth  6 each Topical Daily   enoxaparin (LOVENOX) injection  1 mg/kg Subcutaneous Q12H   furosemide  20 mg Oral QODAY   And   furosemide  40 mg Oral QODAY   insulin aspart  0-15 Units Subcutaneous TID WC   insulin glargine-yfgn  26 Units Subcutaneous Daily   oxybutynin  5 mg Oral TID   polyethylene glycol  17 g Oral BID   Ensure Max Protein  11 oz Oral Daily   rosuvastatin  40 mg Oral q AM   senna-docusate  1 tablet Oral BID   Continuous Infusions:  sodium chloride 250 mL (11/17/22 2133)   ciprofloxacin 400 mg (11/21/22 0746)   fluconazole (DIFLUCAN) IV 200 mg (11/21/22 1131)   sodium chloride irrigation       LOS: 4 days    Time spent: 35 minutes    Irine Seal, MD [facility]   To contact the attending provider between 7A-7P or the covering provider during after hours 7P-7A, please log into the web site [email or web address] and access using universal [place] password for that web site. If you do not have the password, please call the hospital operator.  11/21/2022, 1:31 PM

## 2022-11-21 NOTE — Progress Notes (Signed)
Mobility Specialist - Progress Note   11/21/22 1524  Mobility  Activity Ambulated with assistance in hallway  Level of Assistance Standby assist, set-up cues, supervision of patient - no hands on  Assistive Device Front wheel walker  Distance Ambulated (ft) 200 ft  Range of Motion/Exercises Active  Activity Response Tolerated well  Mobility Referral Yes  $Mobility charge 1 Mobility   Pt was found in bed and agreeable to ambulate. Had assistance from wife to get up from bed and stated having a red rash on his bottom. During ambulation stated feeling somewhat better and at EOS returned to room with wife. RN notified of wife and pt's concerns.  Ferd Hibbs Mobility Specialist

## 2022-11-21 NOTE — Care Management Important Message (Signed)
Important Message  Patient Details IM Letter given Name: Wayne [name] MRN: [id number] Date of Birth: 01-24-1941   Medicare Important Message Given:  Yes     Kerin Salen 11/21/2022, 2:28 PM

## 2022-11-21 NOTE — Progress Notes (Signed)
S: Pt without complaint.   O: Alert and oriented - eating lunch  Abd - soft, NT Foley in place - urine clear   Urine cx with yeast. Fluconazole started.   A/P:  Gross hematuria - resolved   LAD - bx results pending   Urinary retention/urethral sx - cont foley

## 2022-11-22 DIAGNOSIS — I5022 Chronic systolic (congestive) heart failure: Secondary | ICD-10-CM | POA: Diagnosis not present

## 2022-11-22 DIAGNOSIS — N139 Obstructive and reflux uropathy, unspecified: Secondary | ICD-10-CM | POA: Diagnosis not present

## 2022-11-22 DIAGNOSIS — N1831 Chronic kidney disease, stage 3a: Secondary | ICD-10-CM | POA: Diagnosis not present

## 2022-11-22 DIAGNOSIS — E785 Hyperlipidemia, unspecified: Secondary | ICD-10-CM | POA: Diagnosis not present

## 2022-11-22 LAB — CBC
HCT: 38.5 % — ABNORMAL LOW (ref 39.0–52.0)
Hemoglobin: 12.2 g/dL — ABNORMAL LOW (ref 13.0–17.0)
MCH: 28.4 pg (ref 26.0–34.0)
MCHC: 31.7 g/dL (ref 30.0–36.0)
MCV: 89.5 fL (ref 80.0–100.0)
Platelets: 196 10*3/uL (ref 150–400)
RBC: 4.3 MIL/uL (ref 4.22–5.81)
RDW: 12.7 % (ref 11.5–15.5)
WBC: 9.9 10*3/uL (ref 4.0–10.5)
nRBC: 0 % (ref 0.0–0.2)

## 2022-11-22 LAB — BASIC METABOLIC PANEL
Anion gap: 8 (ref 5–15)
BUN: 21 mg/dL (ref 8–23)
CO2: 26 mmol/L (ref 22–32)
Calcium: 8.9 mg/dL (ref 8.9–10.3)
Chloride: 102 mmol/L (ref 98–111)
Creatinine, Ser: 1.37 mg/dL — ABNORMAL HIGH (ref 0.61–1.24)
GFR, Estimated: 52 mL/min — ABNORMAL LOW (ref >60)
Glucose, Bld: 124 mg/dL — ABNORMAL HIGH (ref 70–99)
Potassium: 4.1 mmol/L (ref 3.5–5.1)
Sodium: 136 mmol/L (ref 135–145)

## 2022-11-22 LAB — GLUCOSE, CAPILLARY
Glucose-Capillary: 131 mg/dL — ABNORMAL HIGH (ref 70–99)
Glucose-Capillary: 165 mg/dL — ABNORMAL HIGH (ref 70–99)
Glucose-Capillary: 96 mg/dL (ref 70–99)
Glucose-Capillary: 80 mg/dL (ref 70–99)

## 2022-11-22 MED ORDER — FLUCONAZOLE 100 MG PO TABS
200.0000 mg | ORAL_TABLET | Freq: Every day | ORAL | Status: DC
  Administered 2022-11-23: 200 mg via ORAL
  Filled 2022-11-22: qty 2

## 2022-11-22 NOTE — Progress Notes (Signed)
Bladder irrigated x 1 at 1600 per V.O. Urine is now pink

## 2022-11-22 NOTE — Progress Notes (Signed)
Mobility Specialist - Progress Note   11/22/22 1500  Mobility  Activity Ambulated with assistance in hallway  Level of Assistance Contact guard assist, steadying assist  Assistive Device Front wheel walker  Distance Ambulated (ft) 50 ft  Range of Motion/Exercises Active  Activity Response Tolerated well  Mobility Referral Yes  $Mobility charge 1 Mobility   Pt was found in bed and agreeable to ambulate. During session had a LOB and stated that it was due to his L foot "stepping where it wants", that he does not have control of it. At EOS returned to bed with necessities in reach.  Ferd Hibbs Mobility Specialist

## 2022-11-22 NOTE — Progress Notes (Signed)
Mobility Specialist - Progress Note   11/22/22 1144  Mobility  Activity Stood at bedside  Level of Assistance Contact guard assist, steadying assist  Assistive Device Front wheel walker  Range of Motion/Exercises Active  Activity Response Tolerated fair  Mobility Referral Yes  $Mobility charge 1 Mobility   Pt was found in bed and agreeable to ambulate. Once up from bed stated feeling "woozy" due to just receiving pain medication. Stated he wanted to try ambulation later on due to not feeling well. Was left with necessities in reach and RN notified.   Ferd Hibbs Mobility Specialist

## 2022-11-22 NOTE — Progress Notes (Signed)
   11/22/22 2112  Vitals  Temp 98 F (36.7 C)  Temp Source Oral  BP (!) 100/58  MAP (mmHg) 70  BP Location Left Arm  BP Method Automatic  Patient Position (if appropriate) Lying  Pulse Rate 75  Pulse Rate Source Monitor  Resp 18  MEWS COLOR  MEWS Score Color Green  Oxygen Therapy  SpO2 (!) 89 %  O2 Device Room Air  MEWS Score  MEWS Temp 0  MEWS Systolic 1  MEWS Pulse 0  MEWS RR 0  MEWS LOC 0  MEWS Score 1   Pt's BP is 100/58 (70) and O2 is 89%. MD notified. Pt is on [place] 2L. RN will continue monitor the pt.

## 2022-11-22 NOTE — Progress Notes (Signed)
PROGRESS NOTE    Wayne [name]  PJA:[id number] DOB: 01-16-1941 DOA: 11/16/2022 PCP: [name], MD    Chief Complaint  Patient presents with   Hematuria    Brief Narrative:  81 y.o. male with medical history significant of cardiac arrest, AICD placement, coronary arthrosclerosis, history of NSTEMI, drug-eluting stent placement, paroxysmal atrial fibrillation on apixaban, hypertension, chronic combined systolic and diastolic heart failure, osteoarthritis, cellulitis of the left lower extremity, history of anemia, history of herpes zoster ophthalmicus, noncompliance, obesity, OSA not on CPAP, peripheral neuropathy, type 2 diabetes, polymyalgia rheumatica, urolithiasis, bladder cancer, prostate cancer, with history of brachytherapy in 2009, recurrent high-grade T1 bladder cancer, urethral stricture requiring multiple dilatations and recurring UTIs who presented to [facility] emergency department with gross hematuria and Foley catheter dysfunction.  He has been seen hematuria and has had catheter dysfunction for the past few days.  While he was in the ER, attempts to to irrigate his catheter and even exchange his catheter were performed, but these measures were unsuccessful relieving the obstruction and clotted blood.  His last PSA was undetectable.  Recent PET CT scan showed a posterior bladder wall mass concerning for either bladder or prostate CA recurrence. He has had suprapubic pain, but no flank pain or hematuria.  Denied fever, chills, rhinorrhea, sore throat, wheezing or hemoptysis.  No chest pain, palpitations, diaphoresis, PND, orthopnea, but occasionally develops pitting edema of the lower extremities.  No abdominal pain, nausea, emesis, diarrhea, constipation, melena or hematochezia. No polyuria, polydipsia, polyphagia or blurred vision.    ED course: Initial vital signs were temperature 98.5 F, pulse 96, respiration 24, BP 109/79 mmHg O2 sat 100% on room air.  The patient received  fentanyl 50 mcg IVP and 2% lidocaine jelly was used during Foley exchange attempt.  While in PACU, the patient received oxycodone 5 mg p.o. x 2 and continuous bladder irrigation was initiated   Assessment & Plan:   Principal Problem:   Obstructive uropathy Active Problems:   Hyperlipidemia   Essential hypertension   Systolic CHF, chronic (HCC)   OSA (obstructive sleep apnea)   CAD (coronary artery disease)   CKD (chronic kidney disease) stage 3, GFR 30-59 ml/min (HCC)   Paroxysmal atrial fibrillation (HCC)   Gross hematuria   Type 2 diabetes mellitus with hyperglycemia (HCC)   UTI (urinary tract infection) Probable   Candiduria  #1 gross hematuria/obstructive uropathy -Patient initially seen in the ED, with gross hematuria draining from indwelling catheter and obstruction of catheter, ER attempted to irrigate cath and exchange however was unsuccessful in clearing clot. -Patient seen in consultation by urology and underwent cystoscopy with clot evacuation and fulguration with denuded prostatic urethra noted felt as primary source of bleeding, extensive detrusor trabeculation but no identifiable papillary or sessile mass was seen within the bladder. -Hematuria improved with CBI initially during the hospitalization. -Foley catheter with some hematuria. -Patient noted with progressive abdominal adenopathy on recent PET scan seen by IR status post biopsy with pathology pending. -Patient with complaints of ongoing burning in penile region and pelvic area which per urology patient has had complaints ongoing for a while. -Due to burning urinalysis was done which was concerning for UTI, urine culture is pending. -Urine cultures with 80,000 colonies of yeast.   -Discontinued IV ciprofloxacin and patient started on IV fluconazole. -Patient started on oxybutynin 3 times daily which he states has helped with his pelvic pain. -Urology following and appreciate input and recommendations.  2.   Candiduria -Patient  with urinary complaints of dysuria in the penile region, urinalysis done was concerning for UTI. -Urine cultures with 80,000 colonies of yeast. -Patient with indwelling Foley catheter, has had multiple manipulations/cystoscopies, prior history of grade 1 prostate cancer status post brachytherapy 2009, recurrent high-grade T1 bladder cancer, urethral stricture disease requiring multiple dilatations and history of recurrent UTIs. -Discontinued IV ciprofloxacin. -Continue IV fluconazole and transition to oral fluconazole tomorrow  and treat for 10 to 14 days.  3.  Hyperlipidemia -Continue statin.    4.  Paroxysmal atrial fibrillation -Continue Coreg for rate control.   -Due to possible interaction with fluconazole and Eliquis, Eliquis discontinued patient placed on Lovenox while on fluconazole. Once fluconazole course has been completed may resume back on home regimen Eliquis.   5.  Constipation -Patient with multiple bowel movements on current bowel regimen of MiraLAX twice daily, Senokot-S twice daily, Dulcolax suppositories.   -Status post sorbitol x 2 doses.   -Discontinued Dulcolax suppository.   -Continue MiraLAX daily, Senokot-S twice daily.    6.  Hypertension -Stable on current regimen of Lasix, Coreg.    7.  Chronic systolic heart failure -Euvolemic.   -Continue statin, diuretics, beta-blocker.   -Outpatient follow-up with cardiology.    8.  OSA -Not on CPAP. -Outpatient follow-up.  9.  CAD -Stable.   -Continue diuretics, statin, Coreg.   10.  CKD stage IIIa -Stable.  11.  Type 2 diabetes mellitus with hyperglycemia -Noted to be have been on [place] in the past however noted no longer on Farxiga. -CBGs elevated. -Hemoglobin A1c 11.2 (11/17/2022). -CBG 131 this morning. -Continue Semglee to 26 units daily, SSI. -Patient noted to have discussed with diabetic coordinator on restarting insulin however at this time does not want to restart on  discharge and prefers to follow-up with PCP to start insulin at that time if need be.  12.  Hyperkalemia -Status post Lokelma. -Resolved.   DVT prophylaxis: Eliquis >>> Lovenox Code Status: Full Family Communication: Updated patient, no family at bedside. Disposition: Home when clinically improved.  Status is: Inpatient Remains inpatient appropriate because: Severity of illness   Consultants:  Urology: Dr. Lovena Neighbours 11/16/2022 Interventional radiology: Dr. Serafina Royals 11/17/2022  Procedures:  CT-guided core biopsy left iliac lymph node/ultrasound guided core biopsy left axillary lymph node per IR: Dr. Kathlene Cote 11/18/2022 Cystoscopy with clot evacuation, fulguration: Dr. Lovena Neighbours 11/16/2022  Antimicrobials:  Anti-infectives (From admission, onward)    Start     Dose/Rate Route Frequency Ordered Stop   11/21/22 1200  fluconazole (DIFLUCAN) IVPB 200 mg        200 mg 100 mL/hr over 60 Minutes Intravenous Every 24 hours 11/21/22 0909 12/05/22 1159   11/16/22 1300  ciprofloxacin (CIPRO) IVPB 400 mg        400 mg 200 mL/hr over 60 Minutes Intravenous Every 12 hours 11/16/22 1245 11/21/22 2233   11/16/22 1159  ciprofloxacin (CIPRO) 400 MG/200ML IVPB       Note to Pharmacy: Luane School A: cabinet override      11/16/22 1159 11/16/22 1221         Subjective: Sleeping deeply.  Easily arousable.  Denies any chest pain or shortness of breath.  Feels abdominal pain is improving on current treatment.  Overall feeling better.   Objective: Vitals:   11/21/22 1629 11/21/22 2112 11/22/22 0553 11/22/22 0926  BP: (!) 100/58 101/67 108/61 107/70  Pulse: 75 78 84 82  Resp:  '20 20 18  '$ Temp: 98.2 F (36.8 C) 99.2 F (37.3 C) 98.4  F (36.9 C) 99.1 F (37.3 C)  TempSrc: Oral Oral Oral Oral  SpO2: 92% 94% 95% 100%  Weight:      Height:        Intake/Output Summary (Last 24 hours) at 11/22/2022 1310 Last data filed at 11/22/2022 0900 Gross per 24 hour  Intake 340 ml  Output 3425 ml  Net -3085  ml    Filed Weights   11/16/22 0830 11/16/22 1433  Weight: 101.2 kg 99.8 kg    Examination:  General exam: NAD. Respiratory system: CTAB anterior lung fields.  No wheezes, no crackles, no rhonchi.  Fair air movement.  Speaking in full sentences.   Cardiovascular system: Regular rate rhythm no murmurs rubs or gallops.  No JVD.  No lower extremity edema.  Gastrointestinal system: Abdomen is soft, nontender, nondistended, positive bowel sounds.  No rebound.  No guarding.   Central nervous system: Alert and oriented. No focal neurological deficits. Extremities: Symmetric 5 x 5 power. Skin: No rashes, lesions or ulcers Psychiatry: Judgement and insight appear normal. Mood & affect appropriate.     Data Reviewed: I have personally reviewed following labs and imaging studies  CBC: Recent Labs  Lab 11/16/22 0832 11/17/22 0359 11/18/22 0437 11/19/22 0511 11/20/22 0535 11/21/22 0547 11/22/22 0551  WBC 9.9   < > 8.8 8.7 8.7 8.9 9.9  NEUTROABS 5.6  --   --   --   --  5.3  --   HGB 13.8   < > 11.9* 11.4* 11.7* 12.1* 12.2*  HCT 43.2   < > 38.7* 37.3* 37.9* 39.6 38.5*  MCV 89.8   < > 91.5 91.9 90.2 90.6 89.5  PLT 215   < > 191 186 194 205 196   < > = values in this interval not displayed.     Basic Metabolic Panel: Recent Labs  Lab 11/18/22 0437 11/19/22 0511 11/20/22 0535 11/21/22 0547 11/22/22 0551  NA 135 136 136 132* 136  K 4.7 3.9 3.9 3.8 4.1  CL 106 103 101 99 102  CO2 '22 24 24 23 26  '$ GLUCOSE 225* 178* 190* 160* 124*  BUN 26* 27* 25* 22 21  CREATININE 1.33* 1.31* 1.29* 1.33* 1.37*  CALCIUM 8.5* 8.5* 8.6* 8.6* 8.9     GFR: Estimated Creatinine Clearance: 50.1 mL/min (A) (by C-G formula based on SCr of 1.37 mg/dL (H)).  Liver Function Tests: Recent Labs  Lab 11/16/22 0832 11/17/22 0359 11/18/22 0437 11/19/22 0511  AST '27 17 26 17  '$ ALT '17 14 12 15  '$ ALKPHOS 68 61 54 59  BILITOT 0.6 0.7 0.9 0.6  PROT 6.7 6.0* 5.9* 5.9*  ALBUMIN 3.4* 3.0* 2.8* 3.0*      CBG: Recent Labs  Lab 11/21/22 1141 11/21/22 1637 11/21/22 2110 11/22/22 0742 11/22/22 1136  GLUCAP 211* 163* 145* 131* 165*      Recent Results (from the past 240 hour(s))  Urine Culture     Status: Abnormal   Collection Time: 11/19/22  1:30 PM   Specimen: Urine, Catheterized  Result Value Ref Range Status   Specimen Description   Final    URINE, CATHETERIZED Performed at [facility], [place] [address] [place], [place] [address]    Special Requests   Final    NONE Performed at [facility], [place] [address] [place], [place] [address]    Culture 80,000 COLONIES/mL YEAST (A)  Final   Report Status 11/20/2022 FINAL  Final  Radiology Studies: No results found.      Scheduled Meds:  carvedilol  12.5 mg Oral BID WC   Chlorhexidine Gluconate Cloth  6 each Topical Daily   enoxaparin (LOVENOX) injection  1 mg/kg Subcutaneous Q12H   enoxaparin   Does not apply Once   furosemide  20 mg Oral QODAY   And   furosemide  40 mg Oral QODAY   insulin aspart  0-15 Units Subcutaneous TID WC   insulin glargine-yfgn  26 Units Subcutaneous Daily   oxybutynin  5 mg Oral TID   polyethylene glycol  17 g Oral Daily   Ensure Max Protein  11 oz Oral Daily   rosuvastatin  40 mg Oral q AM   senna-docusate  1 tablet Oral BID   Continuous Infusions:  sodium chloride 250 mL (11/17/22 2133)   fluconazole (DIFLUCAN) IV 200 mg (11/21/22 1131)   sodium chloride irrigation       LOS: 5 days    Time spent: 35 minutes    Irine Seal, MD [facility]   To contact the attending provider between 7A-7P or the covering provider during after hours 7P-7A, please log into the web site [email or web address] and access using universal [place] password for that web site. If you do not have the password, please call the hospital operator.  11/22/2022, 1:10 PM

## 2022-11-23 DIAGNOSIS — N139 Obstructive and reflux uropathy, unspecified: Secondary | ICD-10-CM | POA: Diagnosis not present

## 2022-11-23 DIAGNOSIS — I959 Hypotension, unspecified: Secondary | ICD-10-CM

## 2022-11-23 DIAGNOSIS — N1831 Chronic kidney disease, stage 3a: Secondary | ICD-10-CM | POA: Diagnosis not present

## 2022-11-23 DIAGNOSIS — E785 Hyperlipidemia, unspecified: Secondary | ICD-10-CM | POA: Diagnosis not present

## 2022-11-23 DIAGNOSIS — I5022 Chronic systolic (congestive) heart failure: Secondary | ICD-10-CM | POA: Diagnosis not present

## 2022-11-23 LAB — CBC
HCT: 29.4 % — ABNORMAL LOW (ref 39.0–52.0)
Hemoglobin: 9.2 g/dL — ABNORMAL LOW (ref 13.0–17.0)
MCH: 28.3 pg (ref 26.0–34.0)
MCHC: 31.3 g/dL (ref 30.0–36.0)
MCV: 90.5 fL (ref 80.0–100.0)
Platelets: 193 10*3/uL (ref 150–400)
RBC: 3.25 MIL/uL — ABNORMAL LOW (ref 4.22–5.81)
RDW: 12.6 % (ref 11.5–15.5)
WBC: 11.4 10*3/uL — ABNORMAL HIGH (ref 4.0–10.5)
nRBC: 0 % (ref 0.0–0.2)

## 2022-11-23 LAB — GLUCOSE, CAPILLARY
Glucose-Capillary: 63 mg/dL — ABNORMAL LOW (ref 70–99)
Glucose-Capillary: 79 mg/dL (ref 70–99)
Glucose-Capillary: 94 mg/dL (ref 70–99)
Glucose-Capillary: 106 mg/dL — ABNORMAL HIGH (ref 70–99)
Glucose-Capillary: 139 mg/dL — ABNORMAL HIGH (ref 70–99)
Glucose-Capillary: 136 mg/dL — ABNORMAL HIGH (ref 70–99)

## 2022-11-23 LAB — CBC WITH DIFFERENTIAL/PLATELET
Abs Immature Granulocytes: 0.09 10*3/uL — ABNORMAL HIGH (ref 0.00–0.07)
Basophils Absolute: 0.1 10*3/uL (ref 0.0–0.1)
Basophils Relative: 1 %
Eosinophils Absolute: 0.2 10*3/uL (ref 0.0–0.5)
Eosinophils Relative: 2 %
HCT: 34.7 % — ABNORMAL LOW (ref 39.0–52.0)
Hemoglobin: 10.8 g/dL — ABNORMAL LOW (ref 13.0–17.0)
Immature Granulocytes: 1 %
Lymphocytes Relative: 24 %
Lymphs Abs: 2.4 10*3/uL (ref 0.7–4.0)
MCH: 28.1 pg (ref 26.0–34.0)
MCHC: 31.1 g/dL (ref 30.0–36.0)
MCV: 90.4 fL (ref 80.0–100.0)
Monocytes Absolute: 0.9 10*3/uL (ref 0.1–1.0)
Monocytes Relative: 9 %
Neutro Abs: 6.4 10*3/uL (ref 1.7–7.7)
Neutrophils Relative %: 63 %
Platelets: 203 10*3/uL (ref 150–400)
RBC: 3.84 MIL/uL — ABNORMAL LOW (ref 4.22–5.81)
RDW: 12.7 % (ref 11.5–15.5)
WBC: 10.1 10*3/uL (ref 4.0–10.5)
nRBC: 0 % (ref 0.0–0.2)

## 2022-11-23 LAB — RENAL FUNCTION PANEL
Albumin: 2.5 g/dL — ABNORMAL LOW (ref 3.5–5.0)
Anion gap: 10 (ref 5–15)
BUN: 29 mg/dL — ABNORMAL HIGH (ref 8–23)
CO2: 24 mmol/L (ref 22–32)
Calcium: 8.7 mg/dL — ABNORMAL LOW (ref 8.9–10.3)
Chloride: 99 mmol/L (ref 98–111)
Creatinine, Ser: 1.91 mg/dL — ABNORMAL HIGH (ref 0.61–1.24)
GFR, Estimated: 35 mL/min — ABNORMAL LOW (ref >60)
Glucose, Bld: 108 mg/dL — ABNORMAL HIGH (ref 70–99)
Phosphorus: 5 mg/dL — ABNORMAL HIGH (ref 2.5–4.6)
Potassium: 4.5 mmol/L (ref 3.5–5.1)
Sodium: 133 mmol/L — ABNORMAL LOW (ref 135–145)

## 2022-11-23 LAB — MAGNESIUM: Magnesium: 2.1 mg/dL (ref 1.7–2.4)

## 2022-11-23 MED ORDER — FUROSEMIDE 40 MG PO TABS: 40.0000 mg | ORAL_TABLET | ORAL | Status: DC

## 2022-11-23 MED ORDER — ALBUMIN HUMAN 25 % IV SOLN
25.0000 g | Freq: Once | INTRAVENOUS | Status: AC
  Administered 2022-11-23: 25 g via INTRAVENOUS
  Filled 2022-11-23: qty 100

## 2022-11-23 MED ORDER — SODIUM CHLORIDE 0.9 % IV BOLUS: 1000.0000 mL | Freq: Once | INTRAVENOUS | Status: DC

## 2022-11-23 MED ORDER — CARVEDILOL 12.5 MG PO TABS
12.5000 mg | ORAL_TABLET | Freq: Two times a day (BID) | ORAL | Status: DC
  Administered 2022-11-24 – 2022-11-25 (×3): 12.5 mg via ORAL
  Filled 2022-11-23 (×3): qty 1

## 2022-11-23 MED ORDER — SODIUM CHLORIDE 0.9 % IV BOLUS
250.0000 mL | Freq: Once | INTRAVENOUS | Status: AC
  Administered 2022-11-23: 250 mL via INTRAVENOUS

## 2022-11-23 MED ORDER — DEXTROSE 50 % IV SOLN
INTRAVENOUS | Status: AC
  Filled 2022-11-23: qty 50

## 2022-11-23 MED ORDER — POLYETHYLENE GLYCOL 3350 17 G PO PACK: 17.0000 g | PACK | Freq: Every day | ORAL | Status: DC | PRN

## 2022-11-23 MED ORDER — INSULIN GLARGINE-YFGN 100 UNIT/ML ~~LOC~~ SOLN
15.0000 [IU] | Freq: Every day | SUBCUTANEOUS | Status: DC
  Administered 2022-11-24 – 2022-12-04 (×10): 15 [IU] via SUBCUTANEOUS
  Filled 2022-11-23 (×11): qty 0.15

## 2022-11-23 MED ORDER — FUROSEMIDE 20 MG PO TABS: 20.0000 mg | ORAL_TABLET | ORAL | Status: DC

## 2022-11-23 NOTE — Progress Notes (Signed)
Patient reassessed at shift change, urine is maroon in color at base of foley cathter insertion site. Hand irriigation performed, 2 large clots evacuated. CBI is infusing foley is patent. Urine is red to pink and clear.

## 2022-11-23 NOTE — Progress Notes (Signed)
RN arrived to administer am medications. Pt appeared to be diaphoretic. He complains of dizziness/weakness and lower abdominal pain 10/10. Hospitalist and RR RN contacted to assist. Hospitalist and RR RN at bedside. V.O received to administer 250 cc NS bolus. Pt connected to bedside monitor, CBG  wnl. Pt remains a&ox4. Bladder irrigated 10 cm clot removed. Will continue CBI.   11/23/22 1030  Vitals  BP (!) 84/46  MAP (mmHg) (!) 58  BP Method Automatic  Pulse Rate 85  MEWS COLOR  MEWS Score Color Green  MEWS Score  MEWS Temp 0  MEWS Systolic 1  MEWS Pulse 0  MEWS RR 0  MEWS LOC 0  MEWS Score 1   RR 24,

## 2022-11-23 NOTE — Progress Notes (Signed)
Pt had 800 mL bright red blood in the Foley catheter bag with large blood clots. RN notified Olena Heckle, NP and call the urologist on call. NP, charge nurse and RN at bedside, start CBI and irrigation with syringe to remove the clots build in the catheter. Pt tolerance well with CBI. Foley catheter bag changed after bag is full. Pt express no other needs at this time, call light within reach. Plan of care on going.

## 2022-11-23 NOTE — Progress Notes (Signed)
Patient ID: Wayne [name], male   DOB: 1941/09/04, 81 y.o.   MRN: [id number]  7 Days Post-Op Subjective: Called to assess patient due to recurrent gross hematuria with clots.  He restarted therapeutic anticoagulation for atrial fibrillation with Lovenox (due to interaction between Eliquis and fluconazole) a few days ago.  He was noted to be hypotensive as well this morning.  Hgb dropped to 10.8 from 12.2.  Objective: Vital signs in last 24 hours: Temp:  [97.5 F (36.4 C)-98.3 F (36.8 C)] 97.5 F (36.4 C) (12/10 0945) Pulse Rate:  [73-93] 93 (12/10 1100) Resp:  [14-18] 14 (12/10 1100) BP: (84-105)/(46-79) 95/54 (12/10 1100) SpO2:  [89 %-100 %] 100 % (12/10 1100)  Intake/Output from previous day: 12/09 0701 - 12/10 0700 In: 9780 [P.O.:240] Out: 41740 [Urine:12725] Intake/Output this shift: Total I/O In: 9000 [Other:9000] Out: 20250 [Urine:20250]  Physical Exam:  General: Alert and oriented GU: Catheter irrigated and is pink to red but no additional clot after nursing removed a large amount of clot earlier. Now urine light pink to clear on mild/moderate CBI.  Lab Results: Recent Labs    11/21/22 0547 11/22/22 0551 11/23/22 0939  HGB 12.1* 12.2* 10.8*  HCT 39.6 38.5* 34.7*   BMET Recent Labs    11/22/22 0551 11/23/22 0939  NA 136 133*  K 4.1 4.5  CL 102 99  CO2 26 24  GLUCOSE 124* 108*  BUN 21 29*  CREATININE 1.37* 1.91*  CALCIUM 8.9 8.7*     Studies/Results: No results found.  Assessment/Plan: 1) Hematuria: Continue CBI and titrate back down as tolerated.   LOS: 6 days   Dutch Gray 11/23/2022, 12:55 PM

## 2022-11-23 NOTE — Progress Notes (Signed)
BP is improving, CBI remains, Pt reports pain as better post clot evacuation. Urine output in foley is pink/clear. Urologist provider contacted.   11/23/22 1100  Vitals  BP (!) 95/54  MAP (mmHg) 68  Pulse Rate 93  ECG Heart Rate 93  Resp 14  MEWS COLOR  MEWS Score Color Green  Oxygen Therapy  SpO2 100 %  MEWS Score  MEWS Temp 0  MEWS Systolic 1  MEWS Pulse 0  MEWS RR 0  MEWS LOC 0  MEWS Score 1

## 2022-11-23 NOTE — Progress Notes (Signed)
PROGRESS NOTE    Wayne [name]  DVV:[id number] DOB: 27-Aug-1941 DOA: 11/16/2022 PCP: [name], MD    Chief Complaint  Patient presents with   Hematuria    Brief Narrative:  81 y.o. male with medical history significant of cardiac arrest, AICD placement, coronary arthrosclerosis, history of NSTEMI, drug-eluting stent placement, paroxysmal atrial fibrillation on apixaban, hypertension, chronic combined systolic and diastolic heart failure, osteoarthritis, cellulitis of the left lower extremity, history of anemia, history of herpes zoster ophthalmicus, noncompliance, obesity, OSA not on CPAP, peripheral neuropathy, type 2 diabetes, polymyalgia rheumatica, urolithiasis, bladder cancer, prostate cancer, with history of brachytherapy in 2009, recurrent high-grade T1 bladder cancer, urethral stricture requiring multiple dilatations and recurring UTIs who presented to [facility] emergency department with gross hematuria and Foley catheter dysfunction.  He has been seen hematuria and has had catheter dysfunction for the past few days.  While he was in the ER, attempts to to irrigate his catheter and even exchange his catheter were performed, but these measures were unsuccessful relieving the obstruction and clotted blood.  His last PSA was undetectable.  Recent PET CT scan showed a posterior bladder wall mass concerning for either bladder or prostate CA recurrence. He has had suprapubic pain, but no flank pain or hematuria.  Denied fever, chills, rhinorrhea, sore throat, wheezing or hemoptysis.  No chest pain, palpitations, diaphoresis, PND, orthopnea, but occasionally develops pitting edema of the lower extremities.  No abdominal pain, nausea, emesis, diarrhea, constipation, melena or hematochezia. No polyuria, polydipsia, polyphagia or blurred vision.    ED course: Initial vital signs were temperature 98.5 F, pulse 96, respiration 24, BP 109/79 mmHg O2 sat 100% on room air.  The patient received  fentanyl 50 mcg IVP and 2% lidocaine jelly was used during Foley exchange attempt.  While in PACU, the patient received oxycodone 5 mg p.o. x 2 and continuous bladder irrigation was initiated   Assessment & Plan:   Principal Problem:   Obstructive uropathy Active Problems:   Hyperlipidemia   Essential hypertension   Systolic CHF, chronic (HCC)   OSA (obstructive sleep apnea)   CAD (coronary artery disease)   CKD (chronic kidney disease) stage 3, GFR 30-59 ml/min (HCC)   Paroxysmal atrial fibrillation (HCC)   Gross hematuria   Type 2 diabetes mellitus with hyperglycemia (HCC)   UTI (urinary tract infection) Probable   Candiduria  #1 gross hematuria/obstructive uropathy -Patient initially seen in the ED, with gross hematuria draining from indwelling catheter and obstruction of catheter, ER attempted to irrigate cath and exchange however was unsuccessful in clearing clot. -Patient seen in consultation by urology and underwent cystoscopy with clot evacuation and fulguration with denuded prostatic urethra noted felt as primary source of bleeding, extensive detrusor trabeculation but no identifiable papillary or sessile mass was seen within the bladder. -Hematuria improved with CBI initially during the hospitalization. -Foley catheter with some hematuria today, noted to have worsening hematuria overnight with large clots noted, irrigation attempted overnight and patient subsequently resumed on CBI's. -Patient noted with progressive abdominal adenopathy on recent PET scan seen by IR status post biopsy with pathology pending. -Patient with complaints of ongoing burning in penile region and pelvic area which per urology patient has had complaints ongoing for a while. -Due to dysuria, urinalysis was done which was concerning for UTI, urine culture positive for yeast.. -Urine cultures with 80,000 colonies of yeast.   -Discontinued IV ciprofloxacin and patient started on IV fluconazole. -Patient  with ongoing hematuria again overnight and  this morning, concern for Foley catheter being clotted off.   -RN at bedside irrigating Foley.   -Patient back on CBI's.   -Continue oxybutynin 3 times daily.   -Oxycodone as needed.   -Urology called by RN and will be by to reassess patient.   -Hold Lovenox for now until cleared by urology for resumption of anticoagulation.  -Urology following and appreciate input and recommendations.  2.  Candiduria -Patient with urinary complaints of dysuria in the penile region, urinalysis done was concerning for UTI. -Urine cultures with 80,000 colonies of yeast. -Patient with indwelling Foley catheter, has had multiple manipulations/cystoscopies, prior history of grade 1 prostate cancer status post brachytherapy 2009, recurrent high-grade T1 bladder cancer, urethral stricture disease requiring multiple dilatations and history of recurrent UTIs. -Discontinued IV ciprofloxacin. -Transition IV fluconazole to oral fluconazole to complete a 14-day course of treatment.   3.  Hyperlipidemia -Continue statin.    4.  Paroxysmal atrial fibrillation -Hold Coreg this morning due to systolic blood pressures in the 80s. -Due to possible interaction with fluconazole and Eliquis, Eliquis discontinued patient placed on Lovenox while on fluconazole. Once fluconazole course has been completed may resume back on home regimen Eliquis.  -Patient with ongoing hematuria, clots noted in Foley cath and as such we will hold Lovenox today. -Urology to advise when anticoagulation may be resumed again.  5.  Constipation -Patient with multiple bowel movements on current bowel regimen of MiraLAX twice daily, Senokot-S twice daily, Dulcolax suppositories.   -Status post sorbitol x 2 doses.   -Discontinued Dulcolax suppository.   -Continue Senokot-S twice daily.   -Change MiraLAX to as needed.    6.  Hypertension>>>> hypotension -Patient noted to be hypotensive this morning with  systolic blood pressures in the 80s.   -Patient noted to have had worsening hematuria overnight placed on the CBI's, currently noted with concerns for clot and Foley catheter in the process of being irrigated per RN.   -Hold Lasix, Coreg.   -CBC done this morning with hemoglobin of 10.8 today from 12.2 yesterday.   -Normal saline 250 cc bolus x 1.   -IV albumin x 1.   -Continue treatment for candiduria.   -Check a EKG.   -Change level of care to progressive care and place on telemetry. -Supportive care.  7.  Chronic systolic heart failure -Euvolemic.  -Follows up at the heart failure clinic, 2D echo from 06/27/2022 with a EF of 20 to 44%, grade 2 diastolic dysfunction, moderately reduced right ventricular systolic function, moderately dilated left atrial size. -Systolic blood pressures in the 80s and as such we will hold diuretics and beta-blocker this morning. -Continue statin. -Outpatient follow-up with cardiology.  8.  OSA -Not on CPAP. -Outpatient follow-up.  9.  CAD -Stable.   -Patient hypotensive this morning with systolic blood pressures in the 80s and as such we will hold diuretics and Coreg this morning. -Continue statin.  10.  CKD stage IIIa -Stable.  11.  Type 2 diabetes mellitus with hyperglycemia -Noted to be have been on [place] in the past however noted no longer on Farxiga. -CBGs elevated. -Hemoglobin A1c 11.2 (11/17/2022). -CBG 79 this morning. -Decrease Semglee to 15 units daily.  SSI. -Patient noted to have discussed with diabetic coordinator on restarting insulin however at this time does not want to restart on discharge and prefers to follow-up with PCP to start insulin at that time if need be.  12.  Hyperkalemia -Status post Lokelma. -Resolved.   DVT prophylaxis: Eliquis >>> Lovenox  Code Status: Full Family Communication: Updated patient, no family at bedside. Disposition: Not stable for discharge.   Status is: Inpatient Remains inpatient  appropriate because: Severity of illness   Consultants:  Urology: Dr. Lovena Neighbours 11/16/2022 Interventional radiology: Dr. Serafina Royals 11/17/2022  Procedures:  CT-guided core biopsy left iliac lymph node/ultrasound guided core biopsy left axillary lymph node per IR: Dr. Kathlene Cote 11/18/2022 Cystoscopy with clot evacuation, fulguration: Dr. Lovena Neighbours 11/16/2022  Antimicrobials:  Anti-infectives (From admission, onward)    Start     Dose/Rate Route Frequency Ordered Stop   11/23/22 1000  fluconazole (DIFLUCAN) tablet 200 mg        200 mg Oral Daily 11/22/22 1713 12/05/22 0959   11/21/22 1200  fluconazole (DIFLUCAN) IVPB 200 mg  Status:  Discontinued        200 mg 100 mL/hr over 60 Minutes Intravenous Every 24 hours 11/21/22 0909 11/22/22 1713   11/16/22 1300  ciprofloxacin (CIPRO) IVPB 400 mg        400 mg 200 mL/hr over 60 Minutes Intravenous Every 12 hours 11/16/22 1245 11/21/22 2233   11/16/22 1159  ciprofloxacin (CIPRO) 400 MG/200ML IVPB       Note to Pharmacy: Luane School A: cabinet override      11/16/22 1159 11/16/22 1221         Subjective: Was called by RN that patient somewhat diaphoretic, systolic blood pressures in the 80s, with hematuria and felt to have had a clot which is currently being irrigated.  Came to assess patient patient is significant pain, holding his groin region, complains of diaphoresis.  Denies any chest pain.  No shortness of breath.  RN and rapid response at bedside.  Foley catheter with hematuria noted.  Objective: Vitals:   11/23/22 0425 11/23/22 0945 11/23/22 1027 11/23/22 1030  BP: (!) 105/53 (!) 95/52 (!) 90/53 (!) 84/46  Pulse: 73 85 87 85  Resp: 17 16    Temp: 98.1 F (36.7 C) (!) 97.5 F (36.4 C)    TempSrc: Oral Oral    SpO2: 93% 96%    Weight:      Height:        Intake/Output Summary (Last 24 hours) at 11/23/2022 1100 Last data filed at 11/23/2022 1013 Gross per 24 hour  Intake 18540 ml  Output 31875 ml  Net -13335 ml    Filed Weights    11/16/22 0830 11/16/22 1433  Weight: 101.2 kg 99.8 kg    Examination:  General exam: NAD. Respiratory system: Lungs clear to auscultation bilaterally anterior lung fields.  No wheezes, no crackles, no rhonchi.  Fair air movement.  Speaking in full sentences.  Cardiovascular system: RRR no murmurs rubs or gallops.  No JVD.  No lower extremity edema.  Gastrointestinal system: Abdomen is soft, nondistended, nontender, positive bowel sounds.  No rebound.  No guarding.  Central nervous system: Alert and oriented. No focal neurological deficits. Extremities: Symmetric 5 x 5 power. Skin: No rashes, lesions or ulcers Psychiatry: Judgement and insight appear normal. Mood & affect appropriate.     Data Reviewed: I have personally reviewed following labs and imaging studies  CBC: Recent Labs  Lab 11/19/22 0511 11/20/22 0535 11/21/22 0547 11/22/22 0551 11/23/22 0939  WBC 8.7 8.7 8.9 9.9 10.1  NEUTROABS  --   --  5.3  --  6.4  HGB 11.4* 11.7* 12.1* 12.2* 10.8*  HCT 37.3* 37.9* 39.6 38.5* 34.7*  MCV 91.9 90.2 90.6 89.5 90.4  PLT 186 194 205 196 203  Basic Metabolic Panel: Recent Labs  Lab 11/19/22 0511 11/20/22 0535 11/21/22 0547 11/22/22 0551 11/23/22 0939  NA 136 136 132* 136 133*  K 3.9 3.9 3.8 4.1 4.5  CL 103 101 99 102 99  CO2 '24 24 23 26 24  '$ GLUCOSE 178* 190* 160* 124* 108*  BUN 27* 25* 22 21 29*  CREATININE 1.31* 1.29* 1.33* 1.37* 1.91*  CALCIUM 8.5* 8.6* 8.6* 8.9 8.7*  MG  --   --   --   --  2.1  PHOS  --   --   --   --  5.0*     GFR: Estimated Creatinine Clearance: 35.9 mL/min (A) (by C-G formula based on SCr of 1.91 mg/dL (H)).  Liver Function Tests: Recent Labs  Lab 11/17/22 0359 11/18/22 0437 11/19/22 0511 11/23/22 0939  AST '17 26 17  '$ --   ALT '14 12 15  '$ --   ALKPHOS 61 54 59  --   BILITOT 0.7 0.9 0.6  --   PROT 6.0* 5.9* 5.9*  --   ALBUMIN 3.0* 2.8* 3.0* 2.5*     CBG: Recent Labs  Lab 11/22/22 1652 11/22/22 2107 11/23/22 0732  11/23/22 0835 11/23/22 1031  GLUCAP 96 80 63* 79 94      Recent Results (from the past 240 hour(s))  Urine Culture     Status: Abnormal   Collection Time: 11/19/22  1:30 PM   Specimen: Urine, Catheterized  Result Value Ref Range Status   Specimen Description   Final    URINE, CATHETERIZED Performed at [facility], [place] [address] [place], [place] [address]    Special Requests   Final    NONE Performed at [facility], [place] [address], [place], [place] [address]    Culture 80,000 COLONIES/mL YEAST (A)  Final   Report Status 11/20/2022 FINAL  Final         Radiology Studies: No results found.      Scheduled Meds:  [START ON 11/24/2022] carvedilol  12.5 mg Oral BID WC   Chlorhexidine Gluconate Cloth  6 each Topical Daily   dextrose       enoxaparin (LOVENOX) injection  1 mg/kg Subcutaneous Q12H   enoxaparin   Does not apply Once   fluconazole  200 mg Oral Daily   [START ON 11/24/2022] furosemide  20 mg Oral QODAY   And   [START ON 11/24/2022] furosemide  40 mg Oral QODAY   insulin aspart  0-15 Units Subcutaneous TID WC   insulin glargine-yfgn  26 Units Subcutaneous Daily   oxybutynin  5 mg Oral TID   Ensure Max Protein  11 oz Oral Daily   rosuvastatin  40 mg Oral q AM   senna-docusate  1 tablet Oral BID   Continuous Infusions:  sodium chloride 250 mL (11/17/22 2133)   albumin human     sodium chloride     sodium chloride irrigation       LOS: 6 days    Time spent: 40 minutes    Irine Seal, MD [facility]   To contact the attending provider between 7A-7P or the covering provider during after hours 7P-7A, please log into the web site [email or web address] and access using universal [place] password for that web site. If you do not have the password, please call the hospital operator.  11/23/2022, 11:00 AM

## 2022-11-23 NOTE — Progress Notes (Signed)
Albumin 25 g has finished. Patient remains alert, oriented x4. Respirations are equal regular breathing is non labored.  11/23/22 1744  Vitals  BP (!) 100/55  MAP (mmHg) 69  Pulse Rate 80  ECG Heart Rate 80  Resp 20  MEWS COLOR  MEWS Score Color Green  Oxygen Therapy  SpO2 92 %  MEWS Score  MEWS Temp 0  MEWS Systolic 1  MEWS Pulse 0  MEWS RR 0  MEWS LOC 0  MEWS Score 1

## 2022-11-23 NOTE — Progress Notes (Signed)
       Overnight   NAME: Wayne [name] MRN: [id number] DOB : 1941/08/18    Date of Service   11/23/2022   HPI/Events of Note   Notified by RN for concern of blood clots possibly occluding CBI/Foley. Brief history: 81 year old male with medical history significant for cardiac arrest, AICD, coronary arthrosclerosis, NSTEMI, drug-eluting stent, paroxysmal A-fib, hypertension, chronic systolic diastolic heart failure, osteoarthritis, cellulitis of left lower extremity, anemia, history of herpes zoster ophthalmicus, noncompliance, obesity, OSA not on CPAP, peripheral neuropathy, type II DM, polymyalgia rheumatica, urolithiasis, bladder cancer, prostate cancer with recurrent high-grade T1 bladder cancer, brachytherapy, urethral stricture requiring multiple dilatations and recurrent UTIs. ====================================================== On arrival to bedside nursing staff was attempting to flush catheter of blood clots. Large drainage irrigation was attempted with some improvement,followed by small syringe irrigation to port was completed with clot released from Foley and port area. Re-irrigated with large bore syringe/ completed with success and pressure relief per patient. Obvious improvement in flow after liberation of a clot (+/- 1 cm in tubing diameter when flowing)  CBI was restarted with clearing of urine to a medium pink change and decreasing as irrigant was. Assigned nurse attempting contact with urology.  Patient in no obvious or stated distress after release of clot.     Update: (0521 hrs.) Irrigation catheter cleaned by nursing staff Foley bag and Changed. On departure from room urine is a pink-color with relief of pressure per patient.  Irrigant will remain flowing as prior until contact with urology by nursing staff .    Interventions/ Plan   Foley flushed with good result - Foley free flowing now. CBI restarted under active order Foley bag and tubing replaced and  entirety of device and area cleansed  4. Nursing staff contact to Urology.      Gershon Cull BSN MSNA MSN ACNPC-AG Acute Care Nurse Practitioner [place]

## 2022-11-24 ENCOUNTER — Other Ambulatory Visit: Payer: Self-pay | Admitting: Urology

## 2022-11-24 ENCOUNTER — Inpatient Hospital Stay (HOSPITAL_COMMUNITY): Payer: Medicare Other

## 2022-11-24 DIAGNOSIS — D62 Acute posthemorrhagic anemia: Secondary | ICD-10-CM | POA: Insufficient documentation

## 2022-11-24 DIAGNOSIS — E785 Hyperlipidemia, unspecified: Secondary | ICD-10-CM | POA: Diagnosis not present

## 2022-11-24 DIAGNOSIS — N1831 Chronic kidney disease, stage 3a: Secondary | ICD-10-CM | POA: Diagnosis not present

## 2022-11-24 DIAGNOSIS — I5022 Chronic systolic (congestive) heart failure: Secondary | ICD-10-CM | POA: Diagnosis not present

## 2022-11-24 DIAGNOSIS — N139 Obstructive and reflux uropathy, unspecified: Secondary | ICD-10-CM | POA: Diagnosis not present

## 2022-11-24 LAB — CBC WITH DIFFERENTIAL/PLATELET
Abs Immature Granulocytes: 0.13 10*3/uL — ABNORMAL HIGH (ref 0.00–0.07)
Basophils Absolute: 0.1 10*3/uL (ref 0.0–0.1)
Basophils Relative: 1 %
Eosinophils Absolute: 0.2 10*3/uL (ref 0.0–0.5)
Eosinophils Relative: 2 %
HCT: 25.5 % — ABNORMAL LOW (ref 39.0–52.0)
Hemoglobin: 7.8 g/dL — ABNORMAL LOW (ref 13.0–17.0)
Immature Granulocytes: 1 %
Lymphocytes Relative: 26 %
Lymphs Abs: 2.9 10*3/uL (ref 0.7–4.0)
MCH: 27.8 pg (ref 26.0–34.0)
MCHC: 30.6 g/dL (ref 30.0–36.0)
MCV: 90.7 fL (ref 80.0–100.0)
Monocytes Absolute: 0.8 10*3/uL (ref 0.1–1.0)
Monocytes Relative: 7 %
Neutro Abs: 7 10*3/uL (ref 1.7–7.7)
Neutrophils Relative %: 63 %
Platelets: 189 10*3/uL (ref 150–400)
RBC: 2.81 MIL/uL — ABNORMAL LOW (ref 4.22–5.81)
RDW: 12.6 % (ref 11.5–15.5)
WBC: 11.1 10*3/uL — ABNORMAL HIGH (ref 4.0–10.5)
nRBC: 0 % (ref 0.0–0.2)

## 2022-11-24 LAB — MAGNESIUM: Magnesium: 2.1 mg/dL (ref 1.7–2.4)

## 2022-11-24 LAB — BASIC METABOLIC PANEL
Anion gap: 11 (ref 5–15)
BUN: 40 mg/dL — ABNORMAL HIGH (ref 8–23)
CO2: 22 mmol/L (ref 22–32)
Calcium: 8.5 mg/dL — ABNORMAL LOW (ref 8.9–10.3)
Chloride: 96 mmol/L — ABNORMAL LOW (ref 98–111)
Creatinine, Ser: 2.2 mg/dL — ABNORMAL HIGH (ref 0.61–1.24)
GFR, Estimated: 30 mL/min — ABNORMAL LOW (ref >60)
Glucose, Bld: 154 mg/dL — ABNORMAL HIGH (ref 70–99)
Potassium: 4.3 mmol/L (ref 3.5–5.1)
Sodium: 129 mmol/L — ABNORMAL LOW (ref 135–145)

## 2022-11-24 LAB — PREPARE RBC (CROSSMATCH)

## 2022-11-24 LAB — GLUCOSE, CAPILLARY
Glucose-Capillary: 162 mg/dL — ABNORMAL HIGH (ref 70–99)
Glucose-Capillary: 134 mg/dL — ABNORMAL HIGH (ref 70–99)
Glucose-Capillary: 177 mg/dL — ABNORMAL HIGH (ref 70–99)
Glucose-Capillary: 146 mg/dL — ABNORMAL HIGH (ref 70–99)

## 2022-11-24 MED ORDER — OXYCODONE HCL 5 MG PO TABS
2.5000 mg | ORAL_TABLET | ORAL | Status: DC | PRN
  Administered 2022-11-24: 2.5 mg via ORAL
  Filled 2022-11-24: qty 1

## 2022-11-24 MED ORDER — SODIUM CHLORIDE 0.9 % IV SOLN
3.0000 g | Freq: Once | INTRAVENOUS | Status: AC
  Administered 2022-11-25: 3 g via INTRAVENOUS
  Filled 2022-11-24: qty 8

## 2022-11-24 MED ORDER — FLUCONAZOLE 100MG IVPB
100.0000 mg | INTRAVENOUS | Status: DC
  Administered 2022-11-24 – 2022-11-25 (×2): 100 mg via INTRAVENOUS
  Filled 2022-11-24 (×4): qty 50

## 2022-11-24 MED ORDER — DIPHENHYDRAMINE HCL 25 MG PO CAPS
25.0000 mg | ORAL_CAPSULE | Freq: Once | ORAL | Status: AC
  Administered 2022-11-24: 25 mg via ORAL
  Filled 2022-11-24: qty 1

## 2022-11-24 MED ORDER — FUROSEMIDE 10 MG/ML IJ SOLN
20.0000 mg | Freq: Once | INTRAMUSCULAR | Status: AC
  Administered 2022-11-24: 20 mg via INTRAVENOUS
  Filled 2022-11-24: qty 2

## 2022-11-24 MED ORDER — ACETAMINOPHEN 325 MG PO TABS
650.0000 mg | ORAL_TABLET | Freq: Once | ORAL | Status: AC
  Administered 2022-11-24: 650 mg via ORAL
  Filled 2022-11-24: qty 2

## 2022-11-24 MED ORDER — SODIUM CHLORIDE 0.9% IV SOLUTION: Freq: Once | INTRAVENOUS | Status: AC

## 2022-11-24 NOTE — Plan of Care (Signed)
  Problem: Nutritional: Goal: Maintenance of adequate nutrition will improve Outcome: Progressing   

## 2022-11-24 NOTE — Progress Notes (Signed)
Pharmacy: Unasyn for surgical prophylaxis  Plan: Unasyn 3 gm IV x 1 pre-op for surgical prophylaxis for 11/25/2022 at 1:30 pm   Eudelia Bunch, Pharm.D Use secure chat for questions 11/24/2022 5:00 PM

## 2022-11-24 NOTE — Progress Notes (Signed)
PROGRESS NOTE    Wayne NARDOZZI  OVF:[id number] DOB: 06/26/1941 DOA: 11/16/2022 PCP: [name], MD    Chief Complaint  Patient presents with   Hematuria    Brief Narrative:  81 y.o. male with medical history significant of cardiac arrest, AICD placement, coronary arthrosclerosis, history of NSTEMI, drug-eluting stent placement, paroxysmal atrial fibrillation on apixaban, hypertension, chronic combined systolic and diastolic heart failure, osteoarthritis, cellulitis of the left lower extremity, history of anemia, history of herpes zoster ophthalmicus, noncompliance, obesity, OSA not on CPAP, peripheral neuropathy, type 2 diabetes, polymyalgia rheumatica, urolithiasis, bladder cancer, prostate cancer, with history of brachytherapy in 2009, recurrent high-grade T1 bladder cancer, urethral stricture requiring multiple dilatations and recurring UTIs who presented to [facility] emergency department with gross hematuria and Foley catheter dysfunction.  He has been seen hematuria and has had catheter dysfunction for the past few days.  While he was in the ER, attempts to to irrigate his catheter and even exchange his catheter were performed, but these measures were unsuccessful relieving the obstruction and clotted blood.  His last PSA was undetectable.  Recent PET CT scan showed a posterior bladder wall mass concerning for either bladder or prostate CA recurrence. He has had suprapubic pain, but no flank pain or hematuria.  Denied fever, chills, rhinorrhea, sore throat, wheezing or hemoptysis.  No chest pain, palpitations, diaphoresis, PND, orthopnea, but occasionally develops pitting edema of the lower extremities.  No abdominal pain, nausea, emesis, diarrhea, constipation, melena or hematochezia. No polyuria, polydipsia, polyphagia or blurred vision.    ED course: Initial vital signs were temperature 98.5 F, pulse 96, respiration 24, BP 109/79 mmHg O2 sat 100% on room air.  The patient received  fentanyl 50 mcg IVP and 2% lidocaine jelly was used during Foley exchange attempt.  While in PACU, the patient received oxycodone 5 mg p.o. x 2 and continuous bladder irrigation was initiated   Assessment & Plan:   Principal Problem:   Obstructive uropathy Active Problems:   Hyperlipidemia   Essential hypertension   Systolic CHF, chronic (HCC)   OSA (obstructive sleep apnea)   CAD (coronary artery disease)   CKD (chronic kidney disease) stage 3, GFR 30-59 ml/min (HCC)   Paroxysmal atrial fibrillation (HCC)   Gross hematuria   Type 2 diabetes mellitus with hyperglycemia (HCC)   UTI (urinary tract infection) Probable   Candiduria   Hypotension   Acute blood loss anemia  #1 gross hematuria/obstructive uropathy -Patient initially seen in the ED, with gross hematuria draining from indwelling catheter and obstruction of catheter, ER attempted to irrigate cath and exchange however was unsuccessful in clearing clot. -Patient seen in consultation by urology and underwent cystoscopy with clot evacuation and fulguration with denuded prostatic urethra noted felt as primary source of bleeding, extensive detrusor trabeculation but no identifiable papillary or sessile mass was seen within the bladder. -Hematuria improved with CBI initially during the hospitalization. -Foley catheter with some hematuria today, noted to have worsening hematuria overnight with large clots noted, irrigation attempted overnight and patient subsequently resumed on CBI's. -Patient noted with progressive abdominal adenopathy on recent PET scan seen by IR status post biopsy with pathology pending. -Patient with complaints of ongoing burning in penile region and pelvic area which per urology patient has had complaints ongoing for a while. -Due to dysuria, urinalysis was done which was concerning for UTI, urine culture positive for yeast.. -Urine cultures with 80,000 colonies of yeast.   -Discontinued IV ciprofloxacin and  patient started on  IV fluconazole. -Patient with ongoing hematuria the evening of 11/22/2022 again overnight and on 11/23/2022 with concern for Foley catheter being clotted off.   -Patient back on CBI's.   -Patient reassessed by urology who recommended Foley catheter be changed to get a clean catheter while on fluconazole, patient to the OR tomorrow for cystoscopy possible TURP possible TURBT and Foley change while he is off anticoagulation. -Continue oxybutynin 3 times daily.   -Oxycodone as needed.   -Lovenox discontinued. -Per urology. -Urology following and appreciate input and recommendations.  2.  Candiduria -Patient with urinary complaints of dysuria in the penile region, urinalysis done was concerning for UTI. -Urine cultures with 80,000 colonies of yeast. -Patient with indwelling Foley catheter, has had multiple manipulations/cystoscopies, prior history of grade 1 prostate cancer status post brachytherapy 2009, recurrent high-grade T1 bladder cancer, urethral stricture disease requiring multiple dilatations and history of recurrent UTIs. -Discontinued IV ciprofloxacin. -Due to ongoing issues with hematuria, transition from oral fluconazole back to IV fluconazole to complete a 14-day course of treatment.  3.  Hyperlipidemia -Statin.  4.  Paroxysmal atrial fibrillation -Hold Coreg this morning due to systolic blood pressures in the 80s. -Due to possible interaction with fluconazole and Eliquis, Eliquis discontinued patient placed on Lovenox while on fluconazole. Once fluconazole course has been completed may resume back on home regimen Eliquis.  -Patient with ongoing hematuria, clots noted in Foley cath and as such anticoagulation discontinued.  -Urology to advise when anticoagulation may be resumed again.  5.  Constipation -Patient with multiple bowel movements on current bowel regimen of MiraLAX twice daily, Senokot-S twice daily, Dulcolax suppositories.   -Status post sorbitol x  2 doses.   -Discontinued Dulcolax suppository.   -MiraLAX as needed.   -Continue Senokot-S.    6.  Hypertension>>>> hypotension -Patient noted to be hypotensive the morning of 12/75/1700, with systolic blood pressures in the 80s.   -Patient noted to have had worsening hematuria the night of 11/22/2022, placed on the CBI's, Foley catheter irrigated per RN with some improvement with pinkish urine this morning.  -Continue to hold Lasix, Coreg.   -CBC done this morning with hemoglobin of 7.8 from 9.2 from 10.8 today from 12.2.   -Status post IV albumin x 2, normal saline 250 cc bolus.   -Due to cardiac history and decreasing hemoglobin will transfuse 2 units packed red blood cells.   -Continue treatment for candiduria.    -Supportive care.  7.  Chronic systolic heart failure -Euvolemic.  -Follows up at the heart failure clinic, 2D echo from 06/27/2022 with a EF of 20 to 17%, grade 2 diastolic dysfunction, moderately reduced right ventricular systolic function, moderately dilated left atrial size. -Systolic blood pressures in the 80s on 11/23/2022 and as such diuretics and beta-blocker held and patient given IV albumin.   -Hemoglobin down to 7.8 likely secondary to hematuria.   -Due to cardiac issues with poor EF, CAD, chronic systolic heart failure will transfuse 2 units packed red blood cells.  -Continue statin. -Outpatient follow-up with cardiology.  8.  OSA -Not on CPAP. -Outpatient follow-up.  9.  CAD -Stable.   -Patient hypotensive the morning of 49/44/9675, with systolic blood pressures in the 80s.  Diuretics and Coreg discontinued.  Patient given IV albumin x 2 doses with improvement with blood pressure with systolic blood pressures in the low 100s this morning.  -Hemoglobin trended down to 7.8, will transfuse 2 units PRBCs. -Continue statin.  10.  CKD stage IIIa -Stable.  11.  Type  2 diabetes mellitus with hyperglycemia -Noted to be have been on [place] in the past however  noted no longer on Farxiga. -CBGs elevated. -Hemoglobin A1c 11.2 (11/17/2022). -CBG 162 this morning. -Continue Semglee to 15 units daily.  SSI. -Patient noted to have discussed with diabetic coordinator on restarting insulin however at this time does not want to restart on discharge and prefers to follow-up with PCP to start insulin at that time if need be.  12.  Hyperkalemia -Status post Lokelma. -Resolved.   DVT prophylaxis: Eliquis >>> Lovenox>>>> SCDs Code Status: Full Family Communication: Updated patient, wife at bedside.  Disposition: Not stable for discharge.   Status is: Inpatient Remains inpatient appropriate because: Severity of illness   Consultants:  Urology: Dr. Lovena Neighbours 11/16/2022 Interventional radiology: Dr. Serafina Royals 11/17/2022  Procedures:  CT-guided core biopsy left iliac lymph node/ultrasound guided core biopsy left axillary lymph node per IR: Dr. Kathlene Cote 11/18/2022 Cystoscopy with clot evacuation, fulguration: Dr. Lovena Neighbours 11/16/2022 Transfused 2 units packed red blood cells pending 11/24/2022  Antimicrobials:  Anti-infectives (From admission, onward)    Start     Dose/Rate Route Frequency Ordered Stop   11/24/22 1000  fluconazole (DIFLUCAN) IVPB 100 mg        100 mg 50 mL/hr over 60 Minutes Intravenous Every 24 hours 11/24/22 0832 12/05/22 0959   11/23/22 1000  fluconazole (DIFLUCAN) tablet 200 mg  Status:  Discontinued        200 mg Oral Daily 11/22/22 1713 11/24/22 0832   11/21/22 1200  fluconazole (DIFLUCAN) IVPB 200 mg  Status:  Discontinued        200 mg 100 mL/hr over 60 Minutes Intravenous Every 24 hours 11/21/22 0909 11/22/22 1713   11/16/22 1300  ciprofloxacin (CIPRO) IVPB 400 mg        400 mg 200 mL/hr over 60 Minutes Intravenous Every 12 hours 11/16/22 1245 11/21/22 2233   11/16/22 1159  ciprofloxacin (CIPRO) 400 MG/200ML IVPB       Note to Pharmacy: Luane [employer] A: cabinet override      11/16/22 1159 11/16/22 1221          Subjective: Laying in bed.  Overall feeling better than he did yesterday.  Foley catheter with pinkish urine which is improved.  Denies any chest pain.  Denies any lightheadedness.  No abdominal pain.  Wife at bedside.    Objective: Vitals:   11/23/22 2100 11/24/22 0458 11/24/22 1200 11/24/22 1231  BP: (!) 103/53 (!) 108/53 99/68   Pulse:  85 75 78  Resp:  '19 19 19  '$ Temp:  98.8 F (37.1 C)  98.3 F (36.8 C)  TempSrc:  Oral  Oral  SpO2:  97% 96% 96%  Weight:      Height:        Intake/Output Summary (Last 24 hours) at 11/24/2022 1322 Last data filed at 11/24/2022 1249 Gross per 24 hour  Intake 15446.21 ml  Output 22800 ml  Net -7353.79 ml    Filed Weights   11/16/22 0830 11/16/22 1433  Weight: 101.2 kg 99.8 kg    Examination:  General exam: NAD. Respiratory system: Clear to auscultation bilaterally.  No wheezes, no crackles, no rhonchi.  Fair air movement.  Speaking in full sentences.  Cardiovascular system: RRR no murmurs rubs or gallops.  No JVD.  No lower extremity edema.  Gastrointestinal system: Abdomen is soft, nontender, nondistended, positive bowel sounds.  No rebound.  No guarding.  Central nervous system: Alert and oriented. No focal neurological deficits. Extremities: Symmetric  5 x 5 power. Skin: No rashes, lesions or ulcers Psychiatry: Judgement and insight appear normal. Mood & affect appropriate.     Data Reviewed: I have personally reviewed following labs and imaging studies  CBC: Recent Labs  Lab 11/21/22 0547 11/22/22 0551 11/23/22 0939 11/23/22 1555 11/24/22 0436  WBC 8.9 9.9 10.1 11.4* 11.1*  NEUTROABS 5.3  --  6.4  --  7.0  HGB 12.1* 12.2* 10.8* 9.2* 7.8*  HCT 39.6 38.5* 34.7* 29.4* 25.5*  MCV 90.6 89.5 90.4 90.5 90.7  PLT 205 196 203 193 189     Basic Metabolic Panel: Recent Labs  Lab 11/20/22 0535 11/21/22 0547 11/22/22 0551 11/23/22 0939 11/24/22 0436  NA 136 132* 136 133* 129*  K 3.9 3.8 4.1 4.5 4.3  CL 101 99 102  99 96*  CO2 '24 23 26 24 22  '$ GLUCOSE 190* 160* 124* 108* 154*  BUN 25* 22 21 29* 40*  CREATININE 1.29* 1.33* 1.37* 1.91* 2.20*  CALCIUM 8.6* 8.6* 8.9 8.7* 8.5*  MG  --   --   --  2.1 2.1  PHOS  --   --   --  5.0*  --      GFR: Estimated Creatinine Clearance: 31.2 mL/min (A) (by C-G formula based on SCr of 2.2 mg/dL (H)).  Liver Function Tests: Recent Labs  Lab 11/18/22 0437 11/19/22 0511 11/23/22 0939  AST 26 17  --   ALT 12 15  --   ALKPHOS 54 59  --   BILITOT 0.9 0.6  --   PROT 5.9* 5.9*  --   ALBUMIN 2.8* 3.0* 2.5*     CBG: Recent Labs  Lab 11/23/22 1419 11/23/22 1629 11/23/22 2102 11/24/22 0747 11/24/22 1204  GLUCAP 106* 139* 136* 162* 134*      Recent Results (from the past 240 hour(s))  Urine Culture     Status: Abnormal   Collection Time: 11/19/22  1:30 PM   Specimen: Urine, Catheterized  Result Value Ref Range Status   Specimen Description   Final    URINE, CATHETERIZED Performed at [facility], [place] [address], [place], [place] [address]    Special Requests   Final    NONE Performed at [facility], [place] [address], [place], [place] [address]    Culture 80,000 COLONIES/mL YEAST (A)  Final   Report Status 11/20/2022 FINAL  Final         Radiology Studies: CT ABDOMEN PELVIS WO CONTRAST  Result Date: 11/24/2022 CLINICAL DATA:  Macroscopic, gross hematuria in a 81 year old male. History of prostate cancer. * Tracking Code: BO * EXAM: CT ABDOMEN AND PELVIS WITHOUT CONTRAST TECHNIQUE: Multidetector CT imaging of the abdomen and pelvis was performed following the standard protocol without IV contrast. RADIATION DOSE REDUCTION: This exam was performed according to the departmental dose-optimization program which includes automated exposure control, adjustment of the mA and/or kV according to patient size and/or use of iterative reconstruction technique. COMPARISON:  October 07, 2022 FINDINGS: Lower chest: Lung  bases with signs of LEFT lower lobe consolidative changes which have slightly worsened since previous imaging. The basilar atelectasis in the RIGHT chest. Signs of coronary calcification and pacer defibrillator, incompletely assessed. No chest wall abnormality. Small lymph nodes adjacent to the esophagus in the upper abdomen largest 7 mm (image 14/2) Nodal disease in the inferior subcarinal nodal stations (image 1/2) 13 mm partially visible and better displayed on PET imaging from November 12, 2022. Hepatobiliary: Smooth hepatic contours. No  pericholecystic stranding. No gross biliary duct distension. No visible lesion on noncontrast imaging. Cholelithiasis. Pancreas: Pancreas with normal contours, no signs of inflammation or peripancreatic fluid. Spleen: Normal. Adrenals/Urinary Tract: Adrenal glands are normal. No hydronephrosis. No nephrolithiasis. Exophytic water density lesion arises from the lower pole the RIGHT kidney which does not require dedicated imaging follow-up and is unchanged. Urinary bladder is collapsed about a Foley catheter. There is blood in the lumen. Marked bladder wall thickening is contiguous with abnormal soft tissue about the prostate, see below. Stomach/Bowel: No acute gastrointestinal findings. Appendix is normal. Rectum is displaced and encased by abnormal soft tissue in the rectoprostatic region presumably related to local invasion from tumor that was seen on previous imaging. This appears more confluent than on prior imaging. Vascular/Lymphatic: Pelvic adenopathy and nodularity about the prostate. LEFT pelvic lymph nodes 17 mm is similar to the PET of November 29th but is increased as compared to imaging from October of 2023. Presacral lymph node (image 65/2) 15 mm short axis, previously 14 mm. Scattered small lymph nodes throughout the retroperitoneum some with abnormal morphology are unchanged. Reproductive: Nodular soft tissue throughout the area of the prostate and adjacent  seminal vesicles, contiguous with mesorectal nodal enlargement and displacement of the rectum may show increased mass effect upon the adjacent rectum. Measuring approximately 6.2 x 4.8 cm in the axial plane previously approximately 5.4 x 5.1 cm when measured in a similar fashion. Mesorectal lymph node (image 72/2) 17 mm previously 13 mm. Lymph node or tumor deposit along the LEFT mesorectum 2 cm short axis, previously approximately 1.71.8 cm. Prostate with brachytherapy seeds in place. Abnormal soft tissue also inseparable from the posterior urinary bladder. Aortic atherosclerosis. Other: No ascites small fat containing bilateral inguinal hernias. Musculoskeletal: No acute musculoskeletal process. Spinal degenerative changes. IMPRESSION: 1. Signs of blood in the bladder lumen of the urinary bladder. Urinary bladder decompressed by Foley catheter and inseparable from disease recurrence in the prostate and about the pelvis as discussed. Again, the patient has a history of both bladder and prostate primary. Findings show marked interval worsening particularly when compared to imaging from September of 2023. 2. Suspect interval worsening of dominant area of disease in the pelvis and some of the adjacent areas of nodularity and or nodal disease with further mass effect upon the rectum. 3. Signs of nodal disease in the chest are better visualized on previous PET imaging. 4. Increasing consolidation or collapse of LEFT lower lobe. 5. Cholelithiasis. 6. Aortic atherosclerosis. Aortic Atherosclerosis (ICD10-I70.0). Electronically Signed   By: Zetta Bills M.D.   On: 11/24/2022 11:27        Scheduled Meds:  sodium chloride   Intravenous Once   acetaminophen  650 mg Oral Once   carvedilol  12.5 mg Oral BID WC   Chlorhexidine Gluconate Cloth  6 each Topical Daily   diphenhydrAMINE  25 mg Oral Once   enoxaparin   Does not apply Once   furosemide  20 mg Intravenous Once   insulin aspart  0-15 Units Subcutaneous  TID WC   insulin glargine-yfgn  15 Units Subcutaneous Daily   oxybutynin  5 mg Oral TID   Ensure Max Protein  11 oz Oral Daily   rosuvastatin  40 mg Oral q AM   senna-docusate  1 tablet Oral BID   Continuous Infusions:  sodium chloride Stopped (11/20/22 2044)   fluconazole (DIFLUCAN) IV 100 mg (11/24/22 1102)   sodium chloride irrigation       LOS: 7 days  Time spent: 40 minutes    Irine Seal, MD [facility]   To contact the attending provider between 7A-7P or the covering provider during after hours 7P-7A, please log into the web site [email or web address] and access using universal [place] password for that web site. If you do not have the password, please call the hospital operator.  11/24/2022, 1:22 PM

## 2022-11-24 NOTE — Progress Notes (Signed)
Mobility Specialist - Progress Note   11/24/22 1606  Mobility  Activity Stood at bedside  Level of Assistance +2 (takes two people)  Geographical information systems officer  Activity Response Tolerated fair  $Mobility charge 1 Mobility   Per RN wanted to try ambulation and needed +2 due to pt being disoriented. Pt tried to get up but was unstable and needed min-A to make him more stable. At EOS pt was left in bed with necessities in reach and RN in room.  Ferd Hibbs Mobility Specialist

## 2022-11-24 NOTE — Progress Notes (Signed)
Pt has had 4x clot occurrence all night on his CBI. This RN  hand irrigated foley and had removed 2-3 large clots every time.Foley output is now light pink in color. Changed pt CBI rate  to moderate. Pt in no acute distress. Vital signs are stable. Will continue to monitor.

## 2022-11-24 NOTE — Care Management Important Message (Signed)
Important Message  Patient Details IM Letter given. Name: AYDRIAN HALPIN MRN: [id number] Date of Birth: [birth year]   Medicare Important Message Given:  Yes     Kerin Salen 11/24/2022, 1:57 PM

## 2022-11-24 NOTE — Progress Notes (Signed)
PHARMACY NOTE:  ANTIMICROBIAL RENAL DOSAGE ADJUSTMENT  Current antimicrobial regimen includes a mismatch between antimicrobial dosage and estimated renal function. As per policy approved by the Pharmacy & Therapeutics and Medical Executive Committees, the antimicrobial dosage will be adjusted accordingly.  Current antimicrobial and dosage:  Fluconazole 200 mg q24 hr  Indication: candiduria in setting of recent instrumentation  Renal Function:   Estimated Creatinine Clearance: 31.2 mL/min (A) (by C-G formula based on SCr of 2.2 mg/dL (H)). '[]'$      On intermittent HD, scheduled: '[]'$      On CRRT    Antimicrobial dosage has been changed to:  100 mg q24 hr IV   Additional Comments: Does not appear to be acute infection/pyelo; for asymptomatic candiduria, 200 mg/day should be sufficient, which has been renally adjusted to 100 mg/day   Thank you for allowing pharmacy to be a part of this patient's care.  Reuel Boom, PharmD, BCPS [phone] 11/24/2022, 8:45 AM

## 2022-11-24 NOTE — Consult Note (Signed)
   [facility] Inpatient Consult   11/24/2022  ANAS REISTER [birth year] [id number]  [facility] [ACO] Patient: [facility] Liaison review for patient admitted to [facility]  Primary Care Provider:  [name], MD, [place] [place], is listed to provide the transition of care follow up   Patient has been active  with [facility] for chronic disease management services in the past.  Patient had been engaged by a [facility].  Our community based plan of care has focused on disease management and community resource support.  Patient with ongoing medical follow up noted.  Plan:  Follow for needs.  Will follow up with [facility] RN CM to check if ongoing follow is planned.   Of note, [facility] Care Management services does not replace or interfere with any services that are needed or arranged by inpatient [facility] care management team.   For additional questions or referrals please contact:  Natividad Brood, RN BSN [place]  [phone] business mobile phone Toll free office [phone]  *[facility] [place] [facility]  [phone] Fax number: [phone] [email or web address]

## 2022-11-24 NOTE — Progress Notes (Signed)
8 Days Post-Op Subjective: Patient reports he is "hallucinating, seeing things that aren't there".   Objective: Vital signs in last 24 hours: Temp:  [97.5 F (36.4 C)-98.8 F (37.1 C)] 98.8 F (37.1 C) (12/11 0458) Pulse Rate:  [80-93] 85 (12/11 0458) Resp:  [14-20] 19 (12/11 0458) BP: (84-108)/(46-71) 108/53 (12/11 0458) SpO2:  [92 %-100 %] 97 % (12/11 0458)  Intake/Output from previous day: 12/10 0701 - 12/11 0700 In: 27566.2 [P.O.:480; I.V.:0.3; IV Piggyback:86] Out: 48500 [Urine:48500] Intake/Output this shift: No intake/output data recorded.  Physical Exam:  NAD, alert and oriented Abd- soft and NT Foley in place - urine clear to pink.   Bladder irrigation - a small stringy clots evacuated on hand irrigation with toomy. A few small clots.   Lab Results: Recent Labs    11/23/22 0939 11/23/22 1555 11/24/22 0436  HGB 10.8* 9.2* 7.8*  HCT 34.7* 29.4* 25.5*   BMET Recent Labs    11/23/22 0939 11/24/22 0436  NA 133* 129*  K 4.5 4.3  CL 99 96*  CO2 24 22  GLUCOSE 108* 154*  BUN 29* 40*  CREATININE 1.91* 2.20*  CALCIUM 8.7* 8.5*   No results for input(s): "LABPT", "INR" in the last 72 hours. No results for input(s): "LABURIN" in the last 72 hours. Results for orders placed or performed during the hospital encounter of 11/16/22  Urine Culture     Status: Abnormal   Collection Time: 11/19/22  1:30 PM   Specimen: Urine, Catheterized  Result Value Ref Range Status   Specimen Description   Final    URINE, CATHETERIZED Performed at [facility] [address] [place], [place] [address]    Special Requests   Final    NONE Performed at [facility], [place] [address], [place], [place] [address]    Culture 80,000 COLONIES/mL YEAST (A)  Final   Report Status 11/20/2022 FINAL  Final   *Note: Due to a large number of results and/or encounters for the requested time period, some results have not been displayed. A complete set  of results can be found in Results Review.    Studies/Results: No results found.  Assessment/Plan:  1) gross hematuria - initial bleed 12/3 which appeared to prostate - this is a new issue. Gross hematuria started again on eliquis to lovenox (due to fluconazole interaction) over weekend 12/10. No significant bleeding ongoing. Irrigation appears clear this AM with a few very small clots. Plan resuscitation today - rbc down and Cr up.   2) AKI - likely from hypotension 12/10.   3) pelvic and axillary LAD - path pending  4) UTI - yeast - on fluconazole.  He needs a foley change to get a clean catheter in while on fluconazole. With this rebleed he is at high risk for another one if anticoagulation is restarted. I am going to repeat a CT a/p and consider plan to take tomorrow to OR for cystoscopy poss TURP poss TUBT and foley change while he is off anticoagulation.        LOS: 7 days   Festus Aloe 11/24/2022, 7:59 AM

## 2022-11-25 ENCOUNTER — Encounter (HOSPITAL_COMMUNITY)
Admission: EM | Disposition: A | Discharge status: Hospice | Payer: Self-pay | Source: Home / Self Care | Attending: Internal Medicine

## 2022-11-25 ENCOUNTER — Inpatient Hospital Stay (HOSPITAL_COMMUNITY): Payer: Medicare Other | Admitting: Anesthesiology

## 2022-11-25 ENCOUNTER — Encounter (HOSPITAL_COMMUNITY): Payer: Self-pay | Admitting: Internal Medicine

## 2022-11-25 ENCOUNTER — Inpatient Hospital Stay (HOSPITAL_COMMUNITY): Payer: Medicare Other

## 2022-11-25 DIAGNOSIS — D4110 Neoplasm of uncertain behavior of unspecified renal pelvis: Secondary | ICD-10-CM | POA: Diagnosis not present

## 2022-11-25 DIAGNOSIS — I4729 Other ventricular tachycardia: Secondary | ICD-10-CM

## 2022-11-25 DIAGNOSIS — D630 Anemia in neoplastic disease: Secondary | ICD-10-CM | POA: Diagnosis not present

## 2022-11-25 DIAGNOSIS — N1831 Chronic kidney disease, stage 3a: Secondary | ICD-10-CM | POA: Diagnosis not present

## 2022-11-25 DIAGNOSIS — R590 Localized enlarged lymph nodes: Secondary | ICD-10-CM | POA: Diagnosis not present

## 2022-11-25 DIAGNOSIS — N139 Obstructive and reflux uropathy, unspecified: Secondary | ICD-10-CM | POA: Diagnosis not present

## 2022-11-25 DIAGNOSIS — E785 Hyperlipidemia, unspecified: Secondary | ICD-10-CM | POA: Diagnosis not present

## 2022-11-25 DIAGNOSIS — Z87891 Personal history of nicotine dependence: Secondary | ICD-10-CM

## 2022-11-25 DIAGNOSIS — I5022 Chronic systolic (congestive) heart failure: Secondary | ICD-10-CM | POA: Diagnosis not present

## 2022-11-25 DIAGNOSIS — J189 Pneumonia, unspecified organism: Secondary | ICD-10-CM

## 2022-11-25 DIAGNOSIS — E1149 Type 2 diabetes mellitus with other diabetic neurological complication: Secondary | ICD-10-CM

## 2022-11-25 DIAGNOSIS — D400 Neoplasm of uncertain behavior of prostate: Secondary | ICD-10-CM

## 2022-11-25 HISTORY — PX: CYSTOSCOPY: SHX5120

## 2022-11-25 HISTORY — PX: TRANSURETHRAL RESECTION OF BLADDER TUMOR: SHX2575

## 2022-11-25 HISTORY — PX: TRANSURETHRAL RESECTION OF PROSTATE: SHX73

## 2022-11-25 LAB — BPAM RBC
Blood Product Expiration Date: 202401052359
Blood Product Expiration Date: 202401062359
ISSUE DATE / TIME: 202312111318
ISSUE DATE / TIME: 202312111613
Unit Type and Rh: 5100
Unit Type and Rh: 5100

## 2022-11-25 LAB — CBC WITH DIFFERENTIAL/PLATELET
Abs Immature Granulocytes: 0.1 10*3/uL — ABNORMAL HIGH (ref 0.00–0.07)
Basophils Absolute: 0.1 10*3/uL (ref 0.0–0.1)
Basophils Relative: 1 %
Eosinophils Absolute: 0.2 10*3/uL (ref 0.0–0.5)
Eosinophils Relative: 2 %
HCT: 29.7 % — ABNORMAL LOW (ref 39.0–52.0)
Hemoglobin: 9.5 g/dL — ABNORMAL LOW (ref 13.0–17.0)
Immature Granulocytes: 1 %
Lymphocytes Relative: 20 %
Lymphs Abs: 2.2 10*3/uL (ref 0.7–4.0)
MCH: 27.8 pg (ref 26.0–34.0)
MCHC: 32 g/dL (ref 30.0–36.0)
MCV: 86.8 fL (ref 80.0–100.0)
Monocytes Absolute: 0.8 10*3/uL (ref 0.1–1.0)
Monocytes Relative: 8 %
Neutro Abs: 7.7 10*3/uL (ref 1.7–7.7)
Neutrophils Relative %: 68 %
Platelets: 177 10*3/uL (ref 150–400)
RBC: 3.42 MIL/uL — ABNORMAL LOW (ref 4.22–5.81)
RDW: 13.2 % (ref 11.5–15.5)
WBC: 11.1 10*3/uL — ABNORMAL HIGH (ref 4.0–10.5)
nRBC: 0 % (ref 0.0–0.2)

## 2022-11-25 LAB — SURGICAL PCR SCREEN
MRSA, PCR: NEGATIVE
Staphylococcus aureus: NEGATIVE

## 2022-11-25 LAB — GLUCOSE, CAPILLARY
Glucose-Capillary: 153 mg/dL — ABNORMAL HIGH (ref 70–99)
Glucose-Capillary: 112 mg/dL — ABNORMAL HIGH (ref 70–99)
Glucose-Capillary: 131 mg/dL — ABNORMAL HIGH (ref 70–99)
Glucose-Capillary: 149 mg/dL — ABNORMAL HIGH (ref 70–99)

## 2022-11-25 LAB — HEMOGLOBIN AND HEMATOCRIT, BLOOD
HCT: 30.3 % — ABNORMAL LOW (ref 39.0–52.0)
Hemoglobin: 9.8 g/dL — ABNORMAL LOW (ref 13.0–17.0)

## 2022-11-25 LAB — BASIC METABOLIC PANEL
Anion gap: 9 (ref 5–15)
BUN: 43 mg/dL — ABNORMAL HIGH (ref 8–23)
CO2: 25 mmol/L (ref 22–32)
Calcium: 8.7 mg/dL — ABNORMAL LOW (ref 8.9–10.3)
Chloride: 99 mmol/L (ref 98–111)
Creatinine, Ser: 2.13 mg/dL — ABNORMAL HIGH (ref 0.61–1.24)
GFR, Estimated: 31 mL/min — ABNORMAL LOW (ref >60)
Glucose, Bld: 149 mg/dL — ABNORMAL HIGH (ref 70–99)
Potassium: 4.3 mmol/L (ref 3.5–5.1)
Sodium: 133 mmol/L — ABNORMAL LOW (ref 135–145)

## 2022-11-25 LAB — TYPE AND SCREEN
ABO/RH(D): O POS
Antibody Screen: NEGATIVE
Unit division: 0
Unit division: 0

## 2022-11-25 LAB — MAGNESIUM: Magnesium: 2.3 mg/dL (ref 1.7–2.4)

## 2022-11-25 LAB — STREP PNEUMONIAE URINARY ANTIGEN: Strep Pneumo Urinary Antigen: NEGATIVE

## 2022-11-25 LAB — AMMONIA: Ammonia: 17 umol/L (ref 9–35)

## 2022-11-25 SURGERY — CYSTOSCOPY
Anesthesia: General

## 2022-11-25 MED ORDER — HALOPERIDOL LACTATE 5 MG/ML IJ SOLN
1.0000 mg | Freq: Four times a day (QID) | INTRAMUSCULAR | Status: DC | PRN
  Administered 2022-11-26: 1 mg via INTRAVENOUS
  Filled 2022-11-25 (×2): qty 1

## 2022-11-25 MED ORDER — FENTANYL CITRATE (PF) 100 MCG/2ML IJ SOLN
INTRAMUSCULAR | Status: AC
  Filled 2022-11-25: qty 2

## 2022-11-25 MED ORDER — HALOPERIDOL LACTATE 5 MG/ML IJ SOLN
INTRAMUSCULAR | Status: AC
  Filled 2022-11-25: qty 1

## 2022-11-25 MED ORDER — LIDOCAINE HCL URETHRAL/MUCOSAL 2 % EX GEL
CUTANEOUS | Status: AC
  Filled 2022-11-25: qty 30

## 2022-11-25 MED ORDER — PHENYLEPHRINE HCL (PRESSORS) 10 MG/ML IV SOLN
INTRAVENOUS | Status: AC
  Filled 2022-11-25: qty 1

## 2022-11-25 MED ORDER — SODIUM CHLORIDE 0.9 % IV SOLN
2.0000 g | Freq: Two times a day (BID) | INTRAVENOUS | Status: DC
  Administered 2022-11-25: 2 g via INTRAVENOUS
  Filled 2022-11-25: qty 12.5

## 2022-11-25 MED ORDER — CHLORHEXIDINE GLUCONATE 0.12 % MT SOLN: 15.0000 mL | Freq: Once | OROMUCOSAL | Status: DC

## 2022-11-25 MED ORDER — SODIUM CHLORIDE 0.9 % IR SOLN
Status: DC | PRN
  Administered 2022-11-25: 15000 mL via INTRAVESICAL

## 2022-11-25 MED ORDER — HALOPERIDOL LACTATE 5 MG/ML IJ SOLN
1.0000 mg | Freq: Once | INTRAMUSCULAR | Status: AC
  Administered 2022-11-25: 1 mg via INTRAVENOUS

## 2022-11-25 MED ORDER — ROCURONIUM BROMIDE 100 MG/10ML IV SOLN
INTRAVENOUS | Status: DC | PRN
  Administered 2022-11-25: 65 mg via INTRAVENOUS

## 2022-11-25 MED ORDER — OLANZAPINE 10 MG IM SOLR
2.5000 mg | Freq: Once | INTRAMUSCULAR | Status: AC
  Administered 2022-11-25: 2.5 mg via INTRAMUSCULAR
  Filled 2022-11-25: qty 10

## 2022-11-25 MED ORDER — ETOMIDATE 2 MG/ML IV SOLN
INTRAVENOUS | Status: AC
  Filled 2022-11-25: qty 10

## 2022-11-25 MED ORDER — ETOMIDATE 2 MG/ML IV SOLN
INTRAVENOUS | Status: DC | PRN
  Administered 2022-11-25: 12 mg via INTRAVENOUS

## 2022-11-25 MED ORDER — DEXAMETHASONE SODIUM PHOSPHATE 4 MG/ML IJ SOLN
INTRAMUSCULAR | Status: DC | PRN
  Administered 2022-11-25: 4 mg via INTRAVENOUS

## 2022-11-25 MED ORDER — SODIUM CHLORIDE 0.9 % IV SOLN: INTRAVENOUS | Status: DC

## 2022-11-25 MED ORDER — OLANZAPINE 5 MG PO TBDP
5.0000 mg | ORAL_TABLET | Freq: Two times a day (BID) | ORAL | Status: DC
  Administered 2022-11-25 (×2): 5 mg via ORAL
  Filled 2022-11-25 (×3): qty 1

## 2022-11-25 MED ORDER — FENTANYL CITRATE (PF) 100 MCG/2ML IJ SOLN
INTRAMUSCULAR | Status: DC | PRN
  Administered 2022-11-25: 50 ug via INTRAVENOUS

## 2022-11-25 MED ORDER — ROCURONIUM BROMIDE 10 MG/ML (PF) SYRINGE
PREFILLED_SYRINGE | INTRAVENOUS | Status: AC
  Filled 2022-11-25: qty 10

## 2022-11-25 MED ORDER — MIDAZOLAM HCL 2 MG/2ML IJ SOLN
INTRAMUSCULAR | Status: AC
  Filled 2022-11-25: qty 2

## 2022-11-25 MED ORDER — LIDOCAINE HCL URETHRAL/MUCOSAL 2 % EX GEL
CUTANEOUS | Status: DC | PRN
  Administered 2022-11-25: 1 via URETHRAL

## 2022-11-25 MED ORDER — PHENYLEPHRINE HCL-NACL 20-0.9 MG/250ML-% IV SOLN
INTRAVENOUS | Status: DC | PRN
  Administered 2022-11-25: 60 ug/min via INTRAVENOUS

## 2022-11-25 MED ORDER — IOHEXOL 300 MG/ML  SOLN
INTRAMUSCULAR | Status: DC | PRN
  Administered 2022-11-25: 14 mL

## 2022-11-25 MED ORDER — ONDANSETRON HCL 4 MG/2ML IJ SOLN
INTRAMUSCULAR | Status: DC | PRN
  Administered 2022-11-25: 4 mg via INTRAVENOUS

## 2022-11-25 MED ORDER — CARVEDILOL 12.5 MG PO TABS
12.5000 mg | ORAL_TABLET | Freq: Once | ORAL | Status: AC
  Administered 2022-11-25: 12.5 mg via ORAL
  Filled 2022-11-25: qty 1

## 2022-11-25 MED ORDER — KETOROLAC TROMETHAMINE 30 MG/ML IJ SOLN
INTRAMUSCULAR | Status: AC
  Filled 2022-11-25: qty 1

## 2022-11-25 SURGICAL SUPPLY — 26 items
BAG DRN RND TRDRP ANRFLXCHMBR (UROLOGICAL SUPPLIES) ×1
BAG URINE DRAIN 2000ML AR STRL (UROLOGICAL SUPPLIES) ×1 IMPLANT
BAG URO CATCHER STRL LF (MISCELLANEOUS) ×1 IMPLANT
BASKET ZERO TIP NITINOL 2.4FR (BASKET) IMPLANT
BSKT STON RTRVL ZERO TP 2.4FR (BASKET)
CATH URETL OPEN END 6FR 70 (CATHETERS) IMPLANT
CLOTH BEACON ORANGE TIMEOUT ST (SAFETY) ×1 IMPLANT
DRAPE FOOT SWITCH (DRAPES) ×1 IMPLANT
EVACUATOR MICROVAS BLADDER (UROLOGICAL SUPPLIES) ×1 IMPLANT
GLOVE SURG LX STRL 7.5 STRW (GLOVE) ×1 IMPLANT
GOWN STRL REUS W/ TWL XL LVL3 (GOWN DISPOSABLE) ×1 IMPLANT
GOWN STRL REUS W/TWL XL LVL3 (GOWN DISPOSABLE) ×1
GUIDEWIRE ANG ZIPWIRE 038X150 (WIRE) IMPLANT
GUIDEWIRE STR DUAL SENSOR (WIRE) ×1 IMPLANT
HOLDER FOLEY CATH W/STRAP (MISCELLANEOUS) IMPLANT
KIT TURNOVER KIT A (KITS) IMPLANT
LOOP CUT BIPOLAR 24F LRG (ELECTROSURGICAL) IMPLANT
MANIFOLD NEPTUNE II (INSTRUMENTS) ×1 IMPLANT
PACK CYSTO (CUSTOM PROCEDURE TRAY) ×1 IMPLANT
PENCIL SMOKE EVACUATOR (MISCELLANEOUS) IMPLANT
STENT URET 6FRX26 CONTOUR (STENTS) IMPLANT
SYR 30ML LL (SYRINGE) IMPLANT
SYR TOOMEY IRRIG 70ML (MISCELLANEOUS) ×1
SYRINGE TOOMEY IRRIG 70ML (MISCELLANEOUS) ×1 IMPLANT
TUBING CONNECTING 10 (TUBING) ×1 IMPLANT
TUBING UROLOGY SET (TUBING) ×1 IMPLANT

## 2022-11-25 NOTE — Transfer of Care (Signed)
Immediate Anesthesia Transfer of Care Note  Patient: Mindi Slicker Poser  Procedure(s) Performed: CYSTOSCOPY WITH BILATERAL RETROGRADE AND BILATERAL STENT PLACEMENT TRANSURETHRAL RESECTION OF THE PROSTATE (TURP) TRANSURETHRAL RESECTION OF BLADDER TUMOR (TURBT)  Patient Location: PACU  Anesthesia Type:General  Level of Consciousness: drowsy and patient cooperative  Airway & Oxygen Therapy: Patient Spontanous Breathing and Patient connected to face mask oxygen  Post-op Assessment: Report given to RN and Post -op Vital signs reviewed and stable  Post vital signs: Reviewed and stable  Last Vitals:  Vitals Value Taken Time  BP 95/83 11/25/22 1535  Temp    Pulse 76 11/25/22 1537  Resp 20 11/25/22 1538  SpO2 94 % 11/25/22 1537  Vitals shown include unvalidated device data.  Last Pain:  Vitals:   11/25/22 1256  TempSrc: Oral  PainSc: 4       Patients Stated Pain Goal: 3 (14/48/18 5631)  Complications: No notable events documented.

## 2022-11-25 NOTE — Progress Notes (Signed)
Nutrition Follow-up  INTERVENTION:   -Ensure MAX Protein po daily, each supplement provides 150 kcal and 30 grams of protein   NUTRITION DIAGNOSIS:   Increased nutrient needs related to chronic illness as evidenced by estimated needs.  Ongoing.  GOAL:   Patient will meet greater than or equal to 90% of their needs  Progressing.  MONITOR:   PO intake, Weight trends  ASSESSMENT:   81 y.o. male with PMH significant of cardiac arrest, AICD placement, coronary arthrosclerosis, history of NSTEMI, drug-eluting stent placement, paroxysmal atrial fibrillation, HTn, chronic combined systolic and diastolic heart failure, osteoarthritis, cellulitis of the left lower extremity, history of anemia, obesity, OSA not on CPAP, peripheral neuropathy, DMII, polymyalgia rheumatica, urolithiasis, bladder cancer, prostate cancer, recurrent high-grade T1 bladder cancer, urethral stricture requiring multiple dilatations and recurring UTIs who presented with gross hematuria and Foley catheter dysfunction.  Patient currently NPO, having TURP possible TURBT. Pt was on a carbohydrate modified diet, consuming 25-50% of meals. Pt has been confused and disoriented. Pt was accepting Ensure Max inconsistently.  Admission weight: 223 lbs Current weight: 218 lbs  Medications: Senokot  Labs reviewed: CBGs: 112-177 Low Na  Diet Order:   Diet Order             Diet NPO time specified Except for: Sips with Meds  Diet effective midnight                   EDUCATION NEEDS:   No education needs have been identified at this time  Skin:  Skin Assessment: Skin Integrity Issues: Skin Integrity Issues:: Incisions Incisions: 12/3 perineum  Last BM:  12/10  Height:   Ht Readings from Last 1 Encounters:  11/25/22 '5\' 8"'$  (1.727 m)    Weight:   Wt Readings from Last 1 Encounters:  11/25/22 99 kg    BMI:  Body mass index is 33.19 kg/m.  Estimated Nutritional Needs:   Kcal:  2050-2200  kcals  Protein:  100-120 grams  Fluid:  >/= 2L  Clayton Bibles, MS, RD, LDN Inpatient Clinical Dietitian Contact information available via Amion

## 2022-11-25 NOTE — Progress Notes (Signed)
PROGRESS NOTE    Wayne [name]  WIO:[id number] DOB: 20-Jan-1941 DOA: 11/16/2022 PCP: [name], MD    Chief Complaint  Patient presents with   Hematuria    Brief Narrative:  81 y.o. male with medical history significant of cardiac arrest, AICD placement, coronary arthrosclerosis, history of NSTEMI, drug-eluting stent placement, paroxysmal atrial fibrillation on apixaban, hypertension, chronic combined systolic and diastolic heart failure, osteoarthritis, cellulitis of the left lower extremity, history of anemia, history of herpes zoster ophthalmicus, noncompliance, obesity, OSA not on CPAP, peripheral neuropathy, type 2 diabetes, polymyalgia rheumatica, urolithiasis, bladder cancer, prostate cancer, with history of brachytherapy in 2009, recurrent high-grade T1 bladder cancer, urethral stricture requiring multiple dilatations and recurring UTIs who presented to [facility] emergency department with gross hematuria and Foley catheter dysfunction.  He has been seen hematuria and has had catheter dysfunction for the past few days.  While he was in the ER, attempts to to irrigate his catheter and even exchange his catheter were performed, but these measures were unsuccessful relieving the obstruction and clotted blood.  His last PSA was undetectable.  Recent PET CT scan showed a posterior bladder wall mass concerning for either bladder or prostate CA recurrence. He has had suprapubic pain, but no flank pain or hematuria.  Denied fever, chills, rhinorrhea, sore throat, wheezing or hemoptysis.  No chest pain, palpitations, diaphoresis, PND, orthopnea, but occasionally develops pitting edema of the lower extremities.  No abdominal pain, nausea, emesis, diarrhea, constipation, melena or hematochezia. No polyuria, polydipsia, polyphagia or blurred vision.    ED course: Initial vital signs were temperature 98.5 F, pulse 96, respiration 24, BP 109/79 mmHg O2 sat 100% on room air.  The patient received  fentanyl 50 mcg IVP and 2% lidocaine jelly was used during Foley exchange attempt.  While in PACU, the patient received oxycodone 5 mg p.o. x 2 and continuous bladder irrigation was initiated   Assessment & Plan:   Principal Problem:   Obstructive uropathy Active Problems:   Hyperlipidemia   Essential hypertension   Systolic CHF, chronic (HCC)   OSA (obstructive sleep apnea)   CAD (coronary artery disease)   CKD (chronic kidney disease) stage 3, GFR 30-59 ml/min (HCC)   Paroxysmal atrial fibrillation (HCC)   Gross hematuria   Type 2 diabetes mellitus with hyperglycemia (HCC)   UTI (urinary tract infection) Probable   Candiduria   Hypotension   Acute blood loss anemia   NSVT (nonsustained ventricular tachycardia) ([place])   HCAP (healthcare-associated pneumonia)  #1 gross hematuria/obstructive uropathy -Patient initially seen in the ED, with gross hematuria draining from indwelling catheter and obstruction of catheter, ER attempted to irrigate cath and exchange however was unsuccessful in clearing clot. -Patient seen in consultation by urology and underwent cystoscopy with clot evacuation and fulguration with denuded prostatic urethra noted felt as primary source of bleeding, extensive detrusor trabeculation but no identifiable papillary or sessile mass was seen within the bladder. -Hematuria improved with CBI initially during the hospitalization. -Foley catheter with some hematuria, noted to have worsening hematuria over the past 48 hours, with large clots noted, irrigation attempted and patient subsequently resumed on CBI's. -Patient noted with progressive abdominal adenopathy on recent PET scan seen by IR status post biopsy with pathology pending. -Patient with complaints of ongoing burning in penile region and pelvic area which per urology patient has had complaints ongoing for a while. -Due to dysuria, urinalysis was done which was concerning for UTI, urine culture positive for  yeast.. -Urine cultures  with 80,000 colonies of yeast.   -Discontinued IV ciprofloxacin and patient started on IV fluconazole. -Patient with ongoing hematuria the evening of 11/22/2022 again overnight and on 11/23/2022, 11/24/2022, and today 11/25/2022 with concern for Foley catheter being clotted off.   -Patient back on CBI's.   -Patient reassessed by urology who recommended Foley catheter be changed to get a clean catheter while on fluconazole, patient to the OR today for cystoscopy possible TURP possible TURBT and Foley change while he is off anticoagulation. -Repeat CT abdomen and pelvis done 11/24/2022 with signs of blood in the bladder lumen of the urinary bladder, urinary bladder decompressed by Foley catheter inseparable from disease recurrence in the prostate and about the pelvis.  Findings of markedly interval worsening particularly when compared to imaging from September 2023.  Suspect interval worsening of dominant area of disease in the pelvis and some of the adjacent areas of nodularity and no nodular disease with further mass effect upon the rectum.  Signs of nodal disease in the chest better visualized on pet imaging.  Increasing consultation or collapse of left lower lobe.  Cholelithiasis.  Aortic atherosclerosis. -Continue oxybutynin 3 times daily.   -Oxycodone discontinued due to concerns for hallucinations. -Lovenox discontinued. -Per urology. -Urology following and appreciate input and recommendations.  2.  Candiduria -Patient with urinary complaints of dysuria in the penile region, urinalysis done was concerning for UTI. -Urine cultures with 80,000 colonies of yeast. -Patient with indwelling Foley catheter, has had multiple manipulations/cystoscopies, prior history of grade 1 prostate cancer status post brachytherapy 2009, recurrent high-grade T1 bladder cancer, urethral stricture disease requiring multiple dilatations and history of recurrent UTIs. -Discontinued IV  ciprofloxacin. -Due to ongoing issues with hematuria, transitioned from oral fluconazole back to IV fluconazole to complete a 14-day course of treatment.  3.??  HCAP -CT CT abdomen and pelvis done 11/24/2022 with increasing consolidation or collapse of left lower lobe. -Check a urinary Legionella antigen, urine strep pneumococcus antigen. -Place empirically on IV cefepime. -Pulmonary toileting.  4.  Hyperlipidemia -Continue statin.  5.  Paroxysmal atrial fibrillation -Hold Coreg this morning due to systolic blood pressures in the 80s. -Due to possible interaction with fluconazole and Eliquis, Eliquis discontinued patient placed on Lovenox while on fluconazole. Once fluconazole course has been completed patient was going to be resumed back on Eliquis.  -Patient with ongoing hematuria, clots noted in Foley cath and as such anticoagulation discontinued pending further urological evaluation..  -Urology to advise when anticoagulation may be resumed again.  6.  Constipation -Patient with multiple bowel movements on current bowel regimen of MiraLAX twice daily, Senokot-S twice daily, Dulcolax suppositories.   -Status post sorbitol x 2 doses.   -Discontinued Dulcolax suppository.   -MiraLAX as needed.   -Continue Senokot-S.    7.  Hypertension>>>> hypotension -Patient noted to be hypotensive the morning of 73/22/0254, with systolic blood pressures in the 80s.   -Patient noted to have had worsening hematuria the night of 11/22/2022, placed on the CBI's, Foley catheter irrigated per RN with some improvement with pinkish urine this morning.  -Continue to hold Lasix, Coreg.   -CBC done this morning with hemoglobin of 9.5 from 7.8 from 9.2 from 10.8 from 12.2.   -Status post transfusion 2 units packed red blood cells 11/24/2022. -Status post IV albumin x 2, normal saline 250 cc bolus.   -Gentle hydration. -Continue treatment for candiduria.    -Supportive care.  8.  Chronic systolic heart  failure -Euvolemic.  -Follows up at the heart  failure clinic, 2D echo from 06/27/2022 with a EF of 20 to 30%, grade 2 diastolic dysfunction, moderately reduced right ventricular systolic function, moderately dilated left atrial size. -Systolic blood pressures in the 80s on 11/23/2022 and as such diuretics and beta-blocker held and patient given IV albumin.   -Hemoglobin down to 7.8 likely secondary to hematuria.   -Due to cardiac issues with poor EF, CAD, chronic systolic heart failure patient transfused 2 units packed red blood cells to keep hemoglobin close to 10 range.   -Hemoglobin at 9.5 this morning.  -Continue statin. -Outpatient follow-up with cardiology.  9.  OSA -Not on CPAP. -Outpatient follow-up.  10.  CAD -Stable.   -Patient hypotensive the morning of 16/12/930, with systolic blood pressures in the 80s.  Diuretics and Coreg discontinued.  Patient given IV albumin x 2 doses with improvement with blood pressure with systolic blood pressures in the low 00s this morning.  -Hemoglobin trended down to 7.8, status post transfusion 2 units packed red blood cells hemoglobin at 9.5 today.  -Continue statin.  11.  AKI on CKD stage IIIa -Patient with a bump in creatinine, likely secondary to prerenal azotemia as patient noted to have bouts of hypotension over the past 1 to 2 days.   -Status posttransfusion 2 units packed red blood cells 11/24/2022.   -Placed on gentle hydration for the next 24 hours.   -Being followed by urology and Foley catheter to be exchanged with further evaluation of hematuria today.   -Monitor urine output.    12.  Type 2 diabetes mellitus with hyperglycemia -Noted to be have been on [place] in the past however noted no longer on Farxiga. -CBGs elevated. -Hemoglobin A1c 11.2 (11/17/2022). -CBG 112 this morning. -Continue Semglee to 15 units daily.  SSI. -Patient noted to have discussed with diabetic coordinator on restarting insulin however at this time does  not want to restart on discharge and prefers to follow-up with PCP to start insulin at that time if need be.  13.  Hyperkalemia -Status post Lokelma. -Resolved.  14.  Hallucinations -Patient noted to have some hallucinations per RN. -Patient alert oriented to self place and time.  Does endorse having some hallucinations however also complaining ofSignificant pelvic pain. -Opioids have been discontinued. -Place on Zyprexa 5 mg twice daily. -Continue pain management for bladder spasms with oxybutynin.  15.  Nonsustained V. tach -Noted patient noted to have a run of nonsustained V. tach per RN early on today. -Patient however remained asymptomatic. -Potassium noted at 4.3. -Magnesium at 2.3. -Patient's Coreg has been held for the past 24 hours will resume Coreg. -Patient with history of CAD, chronic systolic heart failure with EF of 20 to 25%. -If patient with further runs of nonsustained V. tach will formally consult with cardiology for further evaluation. -Supportive care   DVT prophylaxis: Eliquis >>> Lovenox>>>> SCDs Code Status: Full Family Communication: Updated patient, wife at bedside.  Disposition: Not stable for discharge.   Status is: Inpatient Remains inpatient appropriate because: Severity of illness   Consultants:  Urology: Dr. Lovena Neighbours 11/16/2022 Interventional radiology: Dr. Serafina Royals 11/17/2022  Procedures:  CT-guided core biopsy left iliac lymph node/ultrasound guided core biopsy left axillary lymph node per IR: Dr. Kathlene Cote 11/18/2022 Cystoscopy with clot evacuation, fulguration: Dr. Lovena Neighbours 11/16/2022 Transfused 2 units packed red blood cells 11/24/2022 CT abdomen and pelvis 11/24/2022  Antimicrobials:  Anti-infectives (From admission, onward)    Start     Dose/Rate Route Frequency Ordered Stop   11/25/22 1330  [MAR Hold]  Ampicillin-Sulbactam (UNASYN) 3 g in sodium chloride 0.9 % 100 mL IVPB        (MAR Hold since Tue 11/25/2022 at 1256.Hold Reason: Transfer to a  Procedural area)   3 g 200 mL/hr over 30 Minutes Intravenous  Once 11/24/22 1659 11/25/22 1406   11/24/22 1000  [MAR Hold]  fluconazole (DIFLUCAN) IVPB 100 mg        (MAR Hold since Tue 11/25/2022 at 1256.Hold Reason: Transfer to a Procedural area)   100 mg 50 mL/hr over 60 Minutes Intravenous Every 24 hours 11/24/22 0832 12/05/22 0959   11/23/22 1000  fluconazole (DIFLUCAN) tablet 200 mg  Status:  Discontinued        200 mg Oral Daily 11/22/22 1713 11/24/22 0832   11/21/22 1200  fluconazole (DIFLUCAN) IVPB 200 mg  Status:  Discontinued        200 mg 100 mL/hr over 60 Minutes Intravenous Every 24 hours 11/21/22 0909 11/22/22 1713   11/16/22 1300  ciprofloxacin (CIPRO) IVPB 400 mg        400 mg 200 mL/hr over 60 Minutes Intravenous Every 12 hours 11/16/22 1245 11/21/22 2233   11/16/22 1159  ciprofloxacin (CIPRO) 400 MG/200ML IVPB       Note to Pharmacy: Luane School A: cabinet override      11/16/22 1159 11/16/22 1221         Subjective: Patient laying in bed with complaints of pelvic pain.  Foley catheter seems to be clogged nurse flushing it with some clots noted.  Patient noted to have some hallucinations overnight that he is aware of.  Patient alert oriented to self place and time.  Denies any chest pain or shortness of breath.  Wife at bedside.   Objective: Vitals:   11/25/22 1330 11/25/22 1535 11/25/22 1545 11/25/22 1547  BP: (!) '90/57 95/83 91/70 '$ 91/70  Pulse: 78 76 76 78  Resp: 18 20 (!) 24 19  Temp:  99 F (37.2 C)    TempSrc:      SpO2: 95% 97% 92% 92%  Weight:      Height:        Intake/Output Summary (Last 24 hours) at 11/25/2022 1603 Last data filed at 11/25/2022 1308 Gross per 24 hour  Intake 9644 ml  Output 9245 ml  Net 399 ml   Filed Weights   11/16/22 0830 11/16/22 1433 11/25/22 1256  Weight: 101.2 kg 99.8 kg 99 kg    Examination:  General exam: NAD. Respiratory system: CTA B anterior lung fields.  No wheezes, no crackles, no rhonchi.  Fair air  movement.  Speaking in full sentences.   Cardiovascular system: Regular rate rhythm no murmurs rubs or gallops.  No JVD.  No lower extremity edema.  Gastrointestinal system: Abdomen is soft, nontender, nondistended, positive bowel sounds.  No rebound.  No guarding.  Central nervous system: Alert and oriented. No focal neurological deficits. Extremities: Symmetric 5 x 5 power. Skin: No rashes, lesions or ulcers Psychiatry: Judgement and insight appear normal. Mood & affect appropriate.     Data Reviewed: I have personally reviewed following labs and imaging studies  CBC: Recent Labs  Lab 11/21/22 0547 11/22/22 0551 11/23/22 0939 11/23/22 1555 11/24/22 0436 11/25/22 0449  WBC 8.9 9.9 10.1 11.4* 11.1* 11.1*  NEUTROABS 5.3  --  6.4  --  7.0 7.7  HGB 12.1* 12.2* 10.8* 9.2* 7.8* 9.5*  HCT 39.6 38.5* 34.7* 29.4* 25.5* 29.7*  MCV 90.6 89.5 90.4 90.5 90.7 86.8  PLT 205 196 203  193 189 606    Basic Metabolic Panel: Recent Labs  Lab 11/21/22 0547 11/22/22 0551 11/23/22 0939 11/24/22 0436 11/25/22 0449  NA 132* 136 133* 129* 133*  K 3.8 4.1 4.5 4.3 4.3  CL 99 102 99 96* 99  CO2 '23 26 24 22 25  '$ GLUCOSE 160* 124* 108* 154* 149*  BUN 22 21 29* 40* 43*  CREATININE 1.33* 1.37* 1.91* 2.20* 2.13*  CALCIUM 8.6* 8.9 8.7* 8.5* 8.7*  MG  --   --  2.1 2.1 2.3  PHOS  --   --  5.0*  --   --     GFR: Estimated Creatinine Clearance: 32.1 mL/min (A) (by C-G formula based on SCr of 2.13 mg/dL (H)).  Liver Function Tests: Recent Labs  Lab 11/19/22 0511 11/23/22 0939  AST 17  --   ALT 15  --   ALKPHOS 59  --   BILITOT 0.6  --   PROT 5.9*  --   ALBUMIN 3.0* 2.5*    CBG: Recent Labs  Lab 11/24/22 1656 11/24/22 2010 11/25/22 0745 11/25/22 1144 11/25/22 1547  GLUCAP 177* 146* 153* 112* 131*     Recent Results (from the past 240 hour(s))  Urine Culture     Status: Abnormal   Collection Time: 11/19/22  1:30 PM   Specimen: Urine, Catheterized  Result Value Ref Range Status    Specimen Description   Final    URINE, CATHETERIZED Performed at [facility], [place] [address] [place], [place] [address]    Special Requests   Final    NONE Performed at [facility], [place] [address], [place], [place] [address]    Culture 80,000 COLONIES/mL YEAST (A)  Final   Report Status 11/20/2022 FINAL  Final  Surgical pcr screen     Status: None   Collection Time: 11/25/22  8:49 AM   Specimen: Nasal Mucosa; Nasal Swab  Result Value Ref Range Status   MRSA, PCR NEGATIVE NEGATIVE Final   Staphylococcus aureus NEGATIVE NEGATIVE Final    Comment: (NOTE) The Xpert SA Assay (FDA approved for NASAL specimens in patients 72 years of age and older), is one component of a comprehensive surveillance program. It is not intended to diagnose infection nor to guide or monitor treatment. Performed at [facility], [place] [address], [place], [place] [address]          Radiology Studies: DG C-Arm 1-60 Min-No Report  Result Date: 11/25/2022 Fluoroscopy was utilized by the requesting physician.  No radiographic interpretation.   CT ABDOMEN PELVIS WO CONTRAST  Result Date: 11/24/2022 CLINICAL DATA:  Macroscopic, gross hematuria in a 81 year old male. History of prostate cancer. * Tracking Code: BO * EXAM: CT ABDOMEN AND PELVIS WITHOUT CONTRAST TECHNIQUE: Multidetector CT imaging of the abdomen and pelvis was performed following the standard protocol without IV contrast. RADIATION DOSE REDUCTION: This exam was performed according to the departmental dose-optimization program which includes automated exposure control, adjustment of the mA and/or kV according to patient size and/or use of iterative reconstruction technique. COMPARISON:  October 07, 2022 FINDINGS: Lower chest: Lung bases with signs of LEFT lower lobe consolidative changes which have slightly worsened since previous imaging. The basilar atelectasis in the RIGHT chest. Signs  of coronary calcification and pacer defibrillator, incompletely assessed. No chest wall abnormality. Small lymph nodes adjacent to the esophagus in the upper abdomen largest 7 mm (image 14/2) Nodal disease in the inferior subcarinal nodal stations (image 1/2) 13 mm partially  visible and better displayed on PET imaging from November 12, 2022. Hepatobiliary: Smooth hepatic contours. No pericholecystic stranding. No gross biliary duct distension. No visible lesion on noncontrast imaging. Cholelithiasis. Pancreas: Pancreas with normal contours, no signs of inflammation or peripancreatic fluid. Spleen: Normal. Adrenals/Urinary Tract: Adrenal glands are normal. No hydronephrosis. No nephrolithiasis. Exophytic water density lesion arises from the lower pole the RIGHT kidney which does not require dedicated imaging follow-up and is unchanged. Urinary bladder is collapsed about a Foley catheter. There is blood in the lumen. Marked bladder wall thickening is contiguous with abnormal soft tissue about the prostate, see below. Stomach/Bowel: No acute gastrointestinal findings. Appendix is normal. Rectum is displaced and encased by abnormal soft tissue in the rectoprostatic region presumably related to local invasion from tumor that was seen on previous imaging. This appears more confluent than on prior imaging. Vascular/Lymphatic: Pelvic adenopathy and nodularity about the prostate. LEFT pelvic lymph nodes 17 mm is similar to the PET of November 29th but is increased as compared to imaging from October of 2023. Presacral lymph node (image 65/2) 15 mm short axis, previously 14 mm. Scattered small lymph nodes throughout the retroperitoneum some with abnormal morphology are unchanged. Reproductive: Nodular soft tissue throughout the area of the prostate and adjacent seminal vesicles, contiguous with mesorectal nodal enlargement and displacement of the rectum may show increased mass effect upon the adjacent rectum. Measuring  approximately 6.2 x 4.8 cm in the axial plane previously approximately 5.4 x 5.1 cm when measured in a similar fashion. Mesorectal lymph node (image 72/2) 17 mm previously 13 mm. Lymph node or tumor deposit along the LEFT mesorectum 2 cm short axis, previously approximately 1.71.8 cm. Prostate with brachytherapy seeds in place. Abnormal soft tissue also inseparable from the posterior urinary bladder. Aortic atherosclerosis. Other: No ascites small fat containing bilateral inguinal hernias. Musculoskeletal: No acute musculoskeletal process. Spinal degenerative changes. IMPRESSION: 1. Signs of blood in the bladder lumen of the urinary bladder. Urinary bladder decompressed by Foley catheter and inseparable from disease recurrence in the prostate and about the pelvis as discussed. Again, the patient has a history of both bladder and prostate primary. Findings show marked interval worsening particularly when compared to imaging from September of 2023. 2. Suspect interval worsening of dominant area of disease in the pelvis and some of the adjacent areas of nodularity and or nodal disease with further mass effect upon the rectum. 3. Signs of nodal disease in the chest are better visualized on previous PET imaging. 4. Increasing consolidation or collapse of LEFT lower lobe. 5. Cholelithiasis. 6. Aortic atherosclerosis. Aortic Atherosclerosis (ICD10-I70.0). Electronically Signed   By: Zetta Bills M.D.   On: 11/24/2022 11:27        Scheduled Meds:  [MAR Hold] carvedilol  12.5 mg Oral BID WC   chlorhexidine  15 mL Mouth/Throat Once   [MAR Hold] Chlorhexidine Gluconate Cloth  6 each Topical Daily   [MAR Hold] enoxaparin   Does not apply Once   [MAR Hold] insulin aspart  0-15 Units Subcutaneous TID WC   [MAR Hold] insulin glargine-yfgn  15 Units Subcutaneous Daily   [MAR Hold] OLANZapine zydis  5 mg Oral BID   [MAR Hold] oxybutynin  5 mg Oral TID   [MAR Hold] Ensure Max Protein  11 oz Oral Daily   [MAR Hold]  rosuvastatin  40 mg Oral q AM   [MAR Hold] senna-docusate  1 tablet Oral BID   Continuous Infusions:  [MAR Hold] sodium chloride Stopped (11/20/22 2044)  sodium chloride 75 mL/hr at 11/25/22 1350   [MAR Hold] fluconazole (DIFLUCAN) IV 100 mg (11/25/22 0952)   sodium chloride irrigation       LOS: 8 days    Time spent: 40 minutes    Irine Seal, MD [facility]   To contact the attending provider between 7A-7P or the covering provider during after hours 7P-7A, please log into the web site [email or web address] and access using universal [place] password for that web site. If you do not have the password, please call the hospital operator.  11/25/2022, 4:03 PM

## 2022-11-25 NOTE — Anesthesia Postprocedure Evaluation (Signed)
Anesthesia Post Note  Patient: Wayne [name]  Procedure(s) Performed: CYSTOSCOPY WITH BILATERAL RETROGRADE AND BILATERAL STENT PLACEMENT TRANSURETHRAL RESECTION OF THE PROSTATE (TURP) TRANSURETHRAL RESECTION OF BLADDER TUMOR (TURBT)     Patient location during evaluation: PACU Anesthesia Type: General Level of consciousness: awake and alert Pain management: pain level controlled Vital Signs Assessment: post-procedure vital signs reviewed and stable Respiratory status: spontaneous breathing, nonlabored ventilation, respiratory function stable and patient connected to nasal cannula oxygen Cardiovascular status: blood pressure returned to baseline and stable Postop Assessment: no apparent nausea or vomiting Anesthetic complications: no  No notable events documented.  Last Vitals:  Vitals:   11/25/22 1600 11/25/22 1622  BP: (!) 110/95 (!) 95/52  Pulse:  72  Resp:  18  Temp: 36.9 C 37.1 C  SpO2:  95%    Last Pain:  Vitals:   11/25/22 1622  TempSrc: Oral  PainSc:                  Santa Lighter

## 2022-11-25 NOTE — Progress Notes (Signed)
S: Patient seen in the preop area.  He is more confused today.  His labs look stable.  He is afebrile with stable vitals.  He did have a clot again this morning the blocked up his catheter.  O:  Vitals:   11/25/22 1206 11/25/22 1256  BP: (!) 112/54 (!) 101/49  Pulse: 85 80  Resp: 17   Temp: 99.3 F (37.4 C) 98.2 F (36.8 C)  SpO2: (!) 85% 95%   He is reaching up into the air.  He told me he was driving about COVID and when I ask him where he was he said he is in the hospital.  He point and said that is his wife" see what he has to put up with" joking around and smiling. He has no focal deficits. Abdomen soft and nontender Foley in place, urine clear Penis and scrotum appear normal without cellulitis or swelling  CBC    Component Value Date/Time   WBC 11.1 (H) 11/25/2022 0449   RBC 3.42 (L) 11/25/2022 0449   HGB 9.5 (L) 11/25/2022 0449   HCT 29.7 (L) 11/25/2022 0449   PLT 177 11/25/2022 0449   MCV 86.8 11/25/2022 0449   MCH 27.8 11/25/2022 0449   MCHC 32.0 11/25/2022 0449   RDW 13.2 11/25/2022 0449   LYMPHSABS 2.2 11/25/2022 0449   MONOABS 0.8 11/25/2022 0449   EOSABS 0.2 11/25/2022 0449   BASOSABS 0.1 11/25/2022 0449   Lab Results  Component Value Date   CREATININE 2.13 (H) 11/25/2022   CREATININE 2.20 (H) 11/24/2022   CREATININE 1.91 (H) 11/23/2022    Assessment/plan: Patient with gross hematuria and progressive pelvic mass with progressive lymphadenopathy, yeast UTI- -Waiting for path on the lymph nodes.  I will touch base with pathology tomorrow -Discussed again with patient and his wife the nature risks benefits and alternatives to cystoscopy with possible TURP possible TURBT.  This procedure has multiple goals including exchanging the catheter for a clean one now that he is on fluconazole, assessing his bleeding risk and source of recurrent gross hematuria, assess for any malignancy.  All questions answered.  They like to proceed.

## 2022-11-25 NOTE — Progress Notes (Signed)
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 MD aware, trialing dose of haldol. Safety precautions in place.

## 2022-11-25 NOTE — Progress Notes (Signed)
Pt has been up all night. Pt very confuse and agitated and constantly trying to get out of bed.This RN able to redirect pt but will however return to confuse/agitated state after few minutes. Wife at bedside constantly comforting pt. NP Olena Heckle made aware and ordered Olanzapine IM but has not been effective since it was administered. Pt is A/O to self at this moment and hallucinating. Vital signs are stable. Will continue to monitor pt.

## 2022-11-25 NOTE — Anesthesia Procedure Notes (Signed)
Procedure Name: Intubation Date/Time: 11/25/2022 2:16 PM  Performed by: Lavina Hamman, CRNAPre-anesthesia Checklist: Patient identified, Emergency Drugs available, Suction available, Patient being monitored and Timeout performed Patient Re-evaluated:Patient Re-evaluated prior to induction Oxygen Delivery Method: Circle system utilized Preoxygenation: Pre-oxygenation with 100% oxygen Induction Type: IV induction Ventilation: Mask ventilation without difficulty Laryngoscope Size: Mac and 3 Grade View: Grade I Tube type: Oral Tube size: 7.5 mm Number of attempts: 1 Airway Equipment and Method: Stylet Placement Confirmation: ETT inserted through vocal cords under direct vision, positive ETCO2, CO2 detector and breath sounds checked- equal and bilateral Secured at: 22 cm Tube secured with: Tape Dental Injury: Teeth and Oropharynx as per pre-operative assessment  Comments: ATOI

## 2022-11-25 NOTE — Anesthesia Preprocedure Evaluation (Addendum)
Anesthesia Evaluation  Patient identified by MRN, date of birth, ID band Patient confused    Reviewed: Allergy & Precautions, NPO status , Patient's Chart, lab work & pertinent test results, reviewed documented beta blocker date and time   Airway Mallampati: II  TM Distance: >3 FB Neck ROM: Full    Dental  (+) Dental Advisory Given, Edentulous Upper, Edentulous Lower   Pulmonary sleep apnea , former smoker   Pulmonary exam normal breath sounds clear to auscultation       Cardiovascular hypertension, Pt. on home beta blockers and Pt. on medications + CAD, + Past MI, + Cardiac Stents, + Peripheral Vascular Disease and +CHF  + dysrhythmias Atrial Fibrillation and Ventricular Tachycardia + Cardiac Defibrillator  Rhythm:Regular Rate:Normal  Echo 06/27/22: 1. Left ventricular ejection fraction, by estimation, is 20 to 25%. The  left ventricle has severely decreased function. The left ventricle  demonstrates global hypokinesis. The left ventricular internal cavity size  was moderately dilated. Left ventricular diastolic parameters are consistent with Grade II diastolic dysfunction (pseudonormalization).   2. Right ventricular systolic function is moderately reduced. The right  ventricular size is mildly enlarged. There is normal pulmonary artery  systolic pressure.   3. Left atrial size was moderately dilated.   4. The mitral valve is grossly normal. No evidence of mitral valve  regurgitation.   5. AV max velocity 1.6 m/s. mean ~7 mmHg, peak gradient 12 mmHg. No  significant aortic stenosis. The aortic valve is calcified. Aortic valve  regurgitation is not visualized.   6. The inferior vena cava is normal in size with greater than 50%  respiratory variability, suggesting right atrial pressure of 3 mmHg.      Neuro/Psych  Neuromuscular disease    GI/Hepatic negative GI ROS, Neg liver ROS,,,  Endo/Other  diabetes, Type 2     Renal/GU Renal InsufficiencyRenal disease   Bladder and prostate cancer     Musculoskeletal  (+) Arthritis ,  Polymyalgia rheumatica   Abdominal   Peds  Hematology  (+) Blood dyscrasia (Eliquis), anemia   Anesthesia Other Findings Day of surgery medications reviewed with the patient.  Reproductive/Obstetrics                             Anesthesia Physical Anesthesia Plan  ASA: 4  Anesthesia Plan: General   Post-op Pain Management: Ofirmev IV (intra-op)*   Induction: Intravenous  PONV Risk Score and Plan: 3 and Dexamethasone and Ondansetron  Airway Management Planned: Oral ETT  Additional Equipment: ClearSight  Intra-op Plan:   Post-operative Plan: Extubation in OR  Informed Consent: I have reviewed the patients History and Physical, chart, labs and discussed the procedure including the risks, benefits and alternatives for the proposed anesthesia with the patient or authorized representative who has indicated his/her understanding and acceptance.     Dental advisory given  Plan Discussed with: CRNA  Anesthesia Plan Comments:         Anesthesia Quick Evaluation

## 2022-11-25 NOTE — Progress Notes (Signed)
PHARMACY NOTE -  Cefepim  Pharmacy has been assisting with dosing of Cefepim for PNA. Dosage remains stable at 2g IV q12 hr and further renal adjustments per institutional Pharmacy antibiotic protocol  Pharmacy will sign off, following peripherally for culture results, dose adjustments, and length of therapy. Please reconsult if a change in clinical status warrants re-evaluation of dosage.  Reuel Boom, PharmD, BCPS [phone] 11/25/2022, 4:21 PM

## 2022-11-25 NOTE — Op Note (Signed)
Preoperative diagnosis: Gross hematuria, pelvic lymphadenopathy Postoperative gnosis: Gross hematuria, prostatic neoplasm of uncertain malignant potential, bladder neoplasm of uncertain malignant potential, pelvic lymphadenopathy  Procedure: TURP, TURBT, bilateral retrograde pyelogram, bilateral ureteral stent placement  Surgeon: Junious Silk  Anesthesia: General  Indication for procedure: Arien is a 81 year old male with a history of brachytherapy.  He has had recurrent gross hematuria and increasing pelvic lymphadenopathy.  Findings: On exam under anesthesia the prostate was firm and flat consistent with prior radiation there was a nodular right seminal vesicle.  There was no palpable pelvic mass on bimanual.  The glans and meatus appeared normal.  The scrotum appeared normal.  The testicles were descended bilaterally palpably normal.  On cystoscopy the urethra was unremarkable, the prior bulb stricture was widely patent.  The prostatic urethra was pale and denuded but up near the proximal prostatic urethra there was a broad-based mass extending out over the trigone.  In the right posterior bladder there was a patch of smoother tan tissue.  Otherwise no mucosal lesions apart from some erythema posteriorly from the catheter.  Right retrograde pyelogram-this outlined a single ureter single collecting system unit without filling defect apart from some air that was in the system.  No significant dilation.  Left retrograde pyelogram-this outlined a single ureter single collecting system unit without filling defect or dilation.  Description of procedure: After consent was obtained patient brought to the operating room.  After adequate anesthesia he was placed in lithotomy position and prepped and draped in the usual sterile fashion.  Timeout was performed to confirm the patient and procedure.  Exam under anesthesia was performed.  Cystoscope passed per urethra the bladder carefully inspected.  I then  switched out to the continuous-flow sheath and the loop and handle.  There was a broad-based high-grade appearing tumor extending out of the proximal prostatic urethra over the bladder neck and into the trigone.  The ureteral orifices were not able to be visualized.  The mass was resected.  I was then able to find both ureteral orifice ease.  Both had been resected.  This was sent as TURP chips but involve the prostatic urethra, bladder neck and trigone.  I then turned attention to the right posterior bladder mass.  This was resected and seemed more firm and necrotic.  Adequate hemostasis was ensured.  This was about a 3 cm area.  I then switched back out with the cystoscope and using an open-ended catheter guided a sensor wire into the right ureteral orifice.  Open-ended catheter was advanced into the right distal ureter and the wire removed.  Retrograde injection of contrast was performed.  I then repassed the sensor wire.  6 x 26 cm stent advanced.  Wire was removed with a good coil seen in the kidney and the bladder.  Identical procedure done on the left side.  6 x 26 cm ureteral stent placed on the left.  I then repassed the loop and handle.  Adequate hemostasis was ensured.  Scope was removed and a 20 [place] coud hematuria catheter was placed.  Irrigation was clear.  He was awakened taken recovery room in stable condition.  Complications: None  Blood loss: Minimal  Specimens to pathology: #1 TURP chips-involve the prostatic urethra, bladder neck and trigone  #2 right posterior bladder  Drains: 20 French hematuria catheter three-way, bilateral 6 x 26 cm ureteral stents  Disposition: Patient stable to PACU

## 2022-11-26 ENCOUNTER — Encounter (HOSPITAL_COMMUNITY): Payer: Self-pay | Admitting: Urology

## 2022-11-26 ENCOUNTER — Inpatient Hospital Stay (HOSPITAL_COMMUNITY): Payer: Medicare Other

## 2022-11-26 DIAGNOSIS — N139 Obstructive and reflux uropathy, unspecified: Secondary | ICD-10-CM | POA: Diagnosis not present

## 2022-11-26 LAB — BASIC METABOLIC PANEL
Anion gap: 14 (ref 5–15)
BUN: 37 mg/dL — ABNORMAL HIGH (ref 8–23)
CO2: 17 mmol/L — ABNORMAL LOW (ref 22–32)
Calcium: 8.7 mg/dL — ABNORMAL LOW (ref 8.9–10.3)
Chloride: 108 mmol/L (ref 98–111)
Creatinine, Ser: 2.22 mg/dL — ABNORMAL HIGH (ref 0.61–1.24)
GFR, Estimated: 29 mL/min — ABNORMAL LOW (ref >60)
Glucose, Bld: 189 mg/dL — ABNORMAL HIGH (ref 70–99)
Potassium: 5.2 mmol/L — ABNORMAL HIGH (ref 3.5–5.1)
Sodium: 139 mmol/L (ref 135–145)

## 2022-11-26 LAB — CBC WITH DIFFERENTIAL/PLATELET
Abs Immature Granulocytes: 0.14 10*3/uL — ABNORMAL HIGH (ref 0.00–0.07)
Basophils Absolute: 0 10*3/uL (ref 0.0–0.1)
Basophils Relative: 0 %
Eosinophils Absolute: 0 10*3/uL (ref 0.0–0.5)
Eosinophils Relative: 0 %
HCT: 30.6 % — ABNORMAL LOW (ref 39.0–52.0)
Hemoglobin: 9.3 g/dL — ABNORMAL LOW (ref 13.0–17.0)
Immature Granulocytes: 1 %
Lymphocytes Relative: 17 %
Lymphs Abs: 2.6 10*3/uL (ref 0.7–4.0)
MCH: 28.2 pg (ref 26.0–34.0)
MCHC: 30.4 g/dL (ref 30.0–36.0)
MCV: 92.7 fL (ref 80.0–100.0)
Monocytes Absolute: 0.4 10*3/uL (ref 0.1–1.0)
Monocytes Relative: 3 %
Neutro Abs: 12.4 10*3/uL — ABNORMAL HIGH (ref 1.7–7.7)
Neutrophils Relative %: 79 %
Platelets: 200 10*3/uL (ref 150–400)
RBC: 3.3 MIL/uL — ABNORMAL LOW (ref 4.22–5.81)
RDW: 13.4 % (ref 11.5–15.5)
WBC: 15.5 10*3/uL — ABNORMAL HIGH (ref 4.0–10.5)
nRBC: 0 % (ref 0.0–0.2)

## 2022-11-26 LAB — MAGNESIUM: Magnesium: 2.3 mg/dL (ref 1.7–2.4)

## 2022-11-26 LAB — GLUCOSE, CAPILLARY
Glucose-Capillary: 177 mg/dL — ABNORMAL HIGH (ref 70–99)
Glucose-Capillary: 173 mg/dL — ABNORMAL HIGH (ref 70–99)
Glucose-Capillary: 170 mg/dL — ABNORMAL HIGH (ref 70–99)
Glucose-Capillary: 223 mg/dL — ABNORMAL HIGH (ref 70–99)

## 2022-11-26 MED ORDER — PIPERACILLIN-TAZOBACTAM 3.375 G IVPB
3.3750 g | Freq: Three times a day (TID) | INTRAVENOUS | Status: DC
  Administered 2022-11-26 – 2022-11-28 (×6): 3.375 g via INTRAVENOUS
  Filled 2022-11-26 (×6): qty 50

## 2022-11-26 MED ORDER — HALOPERIDOL LACTATE 5 MG/ML IJ SOLN: 2.0000 mg | Freq: Four times a day (QID) | INTRAMUSCULAR | Status: DC | PRN

## 2022-11-26 MED ORDER — FLUCONAZOLE 100 MG PO TABS
100.0000 mg | ORAL_TABLET | Freq: Every day | ORAL | Status: DC
  Administered 2022-11-26 – 2022-12-08 (×13): 100 mg via ORAL
  Filled 2022-11-26 (×13): qty 1

## 2022-11-26 MED ORDER — OLANZAPINE 5 MG PO TABS: 5.0000 mg | ORAL_TABLET | Freq: Every day | ORAL | Status: DC

## 2022-11-26 MED ORDER — LACTATED RINGERS IV SOLN: INTRAVENOUS | Status: DC

## 2022-11-26 NOTE — Progress Notes (Signed)
Pharmacy Antibiotic Note  Wayne [name] is a 81 y.o. male admitted on 11/16/2022 with hematuria, s/p urology intervention and is being treated with fluconazole for yeast UTI.  He was started on Cefepime on 12/12, but now Pharmacy has been consulted for Zosyn dosing for HCAP.  Plan: Zosyn 3.375g IV Q8H infused over 4hrs. Pharmacy will sign off notes, but will continue to follow peripherally for renal dosing.  Change fluconazole from IV to PO per P&T policy Patient being treated for a respiratory tract infection, urinary tract infection, cellulitis or clostridium difficile associated diarrhea if on metronidazole The patient is not neutropenic and does not exhibit a GI malabsorption state The patient is eating (either orally or via tube) and/or has been taking other orally administered medications for a least 24 hours   Height: '5\' 8"'$  (172.7 cm) Weight: 99 kg (218 lb 4.1 oz) IBW/kg (Calculated) : 68.4  Temp (24hrs), Avg:98.6 F (37 C), Min:97.8 F (36.6 C), Max:99.3 F (37.4 C)  Recent Labs  Lab 11/22/22 0551 11/23/22 0939 11/23/22 1555 11/24/22 0436 11/25/22 0449 11/26/22 0447  WBC 9.9 10.1 11.4* 11.1* 11.1* 15.5*  CREATININE 1.37* 1.91*  --  2.20* 2.13* 2.22*    Estimated Creatinine Clearance: 30.8 mL/min (A) (by C-G formula based on SCr of 2.22 mg/dL (H)).    Allergies  Allergen Reactions   Entresto [Sacubitril-Valsartan] Swelling and Other (See Comments)    Angioedema   Crestor [Rosuvastatin Calcium] Other (See Comments)    Myalgia and Interferes with gait in high doses   Lipitor [Atorvastatin] Other (See Comments)    Makes legs sore   Plavix [Clopidogrel] Hives, Itching, Swelling and Other (See Comments)    Nose bleeds and welts on legs & back   Ancef [Cefazolin] Itching, Rash and Other (See Comments)    Describes itching and rash, but said that "it wasn't that bad"    Antimicrobials this admission: 12/3 Cipro >> 12/8 12/8 fluconazole >> 12/12 Unasyn x1  preop 12/12 Cefepime >> 12/13 12/13 Zosyn >>   Dose adjustments this admission:   Microbiology results: 12/6 UCx: 80,000 yeast 12/12 surgical screen: MRSA neg, Staph aureus neg  Thank you for allowing pharmacy to be a part of this patient's care.  Gretta Arab PharmD, BCPS [facility] main pharmacy [phone] 11/26/2022 8:40 AM

## 2022-11-26 NOTE — Progress Notes (Signed)
Pt has been agitated throughout the night. Olanzapine given as scheduled, but pt's wife thought the medication did not work and thought it seems to worsen pt's agitation. PRN Haldol was given, yet pt continues to have agitation intermittently. Will continue to monitor pt.

## 2022-11-26 NOTE — Plan of Care (Signed)
  Problem: Coping: Goal: Ability to adjust to condition or change in health will improve Outcome: Progressing   Problem: Fluid Volume: Goal: Ability to maintain a balanced intake and output will improve Outcome: Progressing   Problem: Nutritional: Goal: Maintenance of adequate nutrition will improve Outcome: Progressing   Problem: Tissue Perfusion: Goal: Adequacy of tissue perfusion will improve Outcome: Progressing   Problem: Health Behavior/Discharge Planning: Goal: Ability to manage health-related needs will improve Outcome: Progressing

## 2022-11-26 NOTE — Progress Notes (Signed)
[facility] Progress Note Patient: Wayne [name] BSW:[id number] DOB: 1941/11/02 DOA: 11/16/2022  DOS: the patient was seen and examined on 11/26/2022  Brief hospital course: 81 y.o. PMH of CAD SP PCI, PAF on Eliquis, HTN, chronic combined CHF, OSA, neuropathy, type II DM, PMR, prostate cancer SP brachytherapy, bladder cancer, AICD implant. Presented to hospital with complaints of hematuria and catheter dysfunction. Urology following.  Developed hospital induced delirium. Assessment and Plan: Gross hematuria. History of bladder cancer and prostate cancer. Obstructive uropathy. Seen in ED by urology. Underwent cystoscopy. Currently urine appears to be clear. Per urology can resume Lovenox tomorrow on 12/14 as long as remains stable. Treated with CBI.  Candida UTI. Currently on fluconazole. Anticoagulation had to change to Lovenox for that.  History of bladder cancer. Biopsy performed outpatient. Monitor report Management per urology.  Hospital induced delirium. As needed Haldol and nightly Zyprexa.  Possible aspiration pneumonia. I am not convinced that the patient actually has a pneumonia.  Patient was started on antibiotics which I will continue. Switch cefepime to Zosyn.  Renal function.  AKI on CKD 3A. Renal function elevated. Monitor.  CAD. Stable. On statin.  Anticoagulation currently on hold.  Chronic combined CHF. Hypertension. Blood pressure soft. Currently all antihypertensive regimen is currently on hold. Appears to be euvolemic for now. Monitor.  Type II DM with hyperglycemia with long-term insulin use. Currently continuing basal bolus regimen. Hemoglobin A1c 11.2. Monitor.  Obesity Body mass index is 33.19 kg/m.  Placing the pt at higher risk of poor outcomes.  Subjective: No nausea no vomiting.  Mentation appears to be well-controlled.  No episodes of hallucination or agitation so far.  Physical Exam: General: in mild distress;   Cardiovascular: S1 and S2 Present, no Murmur Respiratory: normal respiratory effort, Bilateral Air entry present, no Crackles, no wheezes Abdomen: Bowel Sound present, Non tender  Extremities: no edema Neurology: alert and oriented to time, place, and person   Data Reviewed: I have Reviewed nursing notes, Vitals, and Lab results. Since last encounter, pertinent lab results CBC and BMP   . I have ordered test including CBC BMP  .   Disposition: Status is: Inpatient Remains inpatient appropriate because: Needs stable serum creatinine and hemoglobin while on anticoagulation.  Place and maintain sequential compression device Start: 11/24/22 1724 SCDs Start: 11/16/22 1423   Family Communication: Discussed with daughter at bedside Level of care: Progressive continue progressive care unit for now due to hypotension. Vitals:   11/26/22 0004 11/26/22 0218 11/26/22 0430 11/26/22 1254  BP: (!) 141/101  (!) 96/45 (!) 100/49  Pulse: 84  79 60  Resp: '20  20 18  '$ Temp: 97.8 F (36.6 C) 99.2 F (37.3 C) 97.8 F (36.6 C) 98.3 F (36.8 C)  TempSrc: Oral Oral Oral Oral  SpO2: 93%  91% 97%  Weight:      Height:         Author: Berle Mull, MD 11/26/2022 7:48 PM  Please look on [email or web address] to find out who is on call.

## 2022-11-26 NOTE — Progress Notes (Signed)
S: Pt sleeping. His daughter is here.   O: Vitals:   11/26/22 0218 11/26/22 0430  BP:  (!) 96/45  Pulse:  79  Resp:  20  Temp: 99.2 F (37.3 C) 97.8 F (36.6 C)  SpO2:  91%   NAD Arousable. Says he is in a hospital bed.  Abd - soft, NT Urine clear   CBC    Component Value Date/Time   WBC 15.5 (H) 11/26/2022 0447   RBC 3.30 (L) 11/26/2022 0447   HGB 9.3 (L) 11/26/2022 0447   HCT 30.6 (L) 11/26/2022 0447   PLT 200 11/26/2022 0447   MCV 92.7 11/26/2022 0447   MCH 28.2 11/26/2022 0447   MCHC 30.4 11/26/2022 0447   RDW 13.4 11/26/2022 0447   LYMPHSABS 2.6 11/26/2022 0447   MONOABS 0.4 11/26/2022 0447   EOSABS 0.0 11/26/2022 0447   BASOSABS 0.0 11/26/2022 0447   Lab Results  Component Value Date   CREATININE 2.22 (H) 11/26/2022   CREATININE 2.13 (H) 11/25/2022   CREATININE 2.20 (H) 11/24/2022   A/P:  1) prostate/bladder neck/trigone mass - s/p TURP/TURBT and bilateral ureteral stents 11/25/2022. This is likely source of bleeding and pelvic LAD. Path pending. Very surprised it had not caused bilateral hydronephrosis. Mass looks worrisome for high grade disease (broad base, infiltrating type growth) and he'll probably need oncology consult.   2) pelvic and ax LAD - path pending bx last week.  Once he's awake and ambulatory I will remove foley. As far as anticoagulation, he'll be at high risk for bleeding given the bladder mass, but should tolerate it better now that the mass has been partially resected. OK to restart tomorrow from GU pt of view if he needs it.

## 2022-11-26 NOTE — OR Nursing (Signed)
Chart opened to create lab orders and print paperwork for specimen

## 2022-11-27 ENCOUNTER — Ambulatory Visit (HOSPITAL_COMMUNITY): Payer: Medicare Other

## 2022-11-27 ENCOUNTER — Encounter (HOSPITAL_COMMUNITY): Payer: Medicare Other | Admitting: Cardiology

## 2022-11-27 LAB — CBC
HCT: 28.7 % — ABNORMAL LOW (ref 39.0–52.0)
Hemoglobin: 9 g/dL — ABNORMAL LOW (ref 13.0–17.0)
MCH: 28 pg (ref 26.0–34.0)
MCHC: 31.4 g/dL (ref 30.0–36.0)
MCV: 89.1 fL (ref 80.0–100.0)
Platelets: 241 10*3/uL (ref 150–400)
RBC: 3.22 MIL/uL — ABNORMAL LOW (ref 4.22–5.81)
RDW: 13.5 % (ref 11.5–15.5)
WBC: 13.6 10*3/uL — ABNORMAL HIGH (ref 4.0–10.5)
nRBC: 0 % (ref 0.0–0.2)

## 2022-11-27 LAB — LEGIONELLA PNEUMOPHILA SEROGP 1 UR AG: L. pneumophila Serogp 1 Ur Ag: NEGATIVE

## 2022-11-27 LAB — RENAL FUNCTION PANEL
Albumin: 2.7 g/dL — ABNORMAL LOW (ref 3.5–5.0)
Anion gap: 7 (ref 5–15)
BUN: 39 mg/dL — ABNORMAL HIGH (ref 8–23)
CO2: 23 mmol/L (ref 22–32)
Calcium: 8.7 mg/dL — ABNORMAL LOW (ref 8.9–10.3)
Chloride: 108 mmol/L (ref 98–111)
Creatinine, Ser: 1.98 mg/dL — ABNORMAL HIGH (ref 0.61–1.24)
GFR, Estimated: 34 mL/min — ABNORMAL LOW (ref >60)
Glucose, Bld: 190 mg/dL — ABNORMAL HIGH (ref 70–99)
Phosphorus: 2.8 mg/dL (ref 2.5–4.6)
Potassium: 4.6 mmol/L (ref 3.5–5.1)
Sodium: 138 mmol/L (ref 135–145)

## 2022-11-27 LAB — SURGICAL PATHOLOGY

## 2022-11-27 LAB — MAGNESIUM: Magnesium: 2.3 mg/dL (ref 1.7–2.4)

## 2022-11-27 LAB — GLUCOSE, CAPILLARY
Glucose-Capillary: 167 mg/dL — ABNORMAL HIGH (ref 70–99)
Glucose-Capillary: 198 mg/dL — ABNORMAL HIGH (ref 70–99)
Glucose-Capillary: 198 mg/dL — ABNORMAL HIGH (ref 70–99)
Glucose-Capillary: 172 mg/dL — ABNORMAL HIGH (ref 70–99)
Glucose-Capillary: 162 mg/dL — ABNORMAL HIGH (ref 70–99)

## 2022-11-27 MED ORDER — LIDOCAINE HCL 1 % IJ SOLN
5.0000 mL | Freq: Once | INTRAMUSCULAR | Status: DC
  Filled 2022-11-27: qty 5

## 2022-11-27 MED ORDER — DOXYCYCLINE HYCLATE 100 MG PO TABS
100.0000 mg | ORAL_TABLET | Freq: Two times a day (BID) | ORAL | Status: DC
  Administered 2022-11-27 – 2022-12-07 (×21): 100 mg via ORAL
  Filled 2022-11-27 (×21): qty 1

## 2022-11-27 MED ORDER — OXYCODONE HCL 5 MG PO TABS
5.0000 mg | ORAL_TABLET | Freq: Four times a day (QID) | ORAL | Status: DC | PRN
  Administered 2022-11-28 – 2022-11-29 (×2): 5 mg via ORAL
  Filled 2022-11-27 (×2): qty 1

## 2022-11-27 MED ORDER — PHENOL 1.4 % MT LIQD
1.0000 | OROMUCOSAL | Status: DC | PRN
  Administered 2022-11-27: 1 via OROMUCOSAL
  Filled 2022-11-27: qty 177

## 2022-11-27 NOTE — Progress Notes (Signed)
Mobility Specialist - Progress Note   11/27/22 1409  Mobility  Activity Ambulated with assistance in hallway  Level of Assistance Minimal assist, patient does 75% or more  Assistive Device Front wheel walker  Distance Ambulated (ft) 20 ft  Activity Response Tolerated well  Mobility Referral Yes  $Mobility charge 1 Mobility   Pt received in bed and agreed to mobility. Pt was very shaky during session, so distance was limited to prevent fatiguing. Pt returned to bed with all needs met and family in room.   Roderick Pee Mobility Specialist

## 2022-11-27 NOTE — Progress Notes (Signed)
[facility] Progress Note Patient: Wayne [name]:[id number] DOB: July 12, 1941 DOA: 11/16/2022  DOS: the patient was seen and examined on 11/27/2022  Brief hospital course: 81 y.o. PMH of CAD SP PCI, PAF on Eliquis, HTN, chronic combined CHF, OSA, neuropathy, type II DM, PMR, prostate cancer SP brachytherapy, bladder cancer, AICD implant. Presented to hospital with complaints of hematuria and catheter dysfunction. Urology following.  Developed hospital induced delirium. Assessment and Plan: Gross hematuria. History of bladder cancer and prostate cancer. Obstructive uropathy. Seen in ED by urology. Underwent cystoscopy. Currently urine appears to be clear. Per urology can resume Lovenox as long as remains stable.  But appears to have some hematuria again on 12/14 therefore will monitor. Treated with CBI.  Candida UTI. Currently on fluconazole. Anticoagulation had to change to Lovenox for that.  History of bladder cancer. Biopsy performed outpatient. Monitor report Management per urology.  Concern for lymphoma/SLL. Biopsy positive for possible lymphoma/SLL. Will consult hematology on 12/15.  Concern for small cell carcinoma with metastasis Biopsy result positive for small cell carcinoma features. Most likely will require oncology consultation.  Left thumb paronychia Currently on IV Zosyn.  Add doxycycline. Hand surgery consulted.  Planning bedside debridement.  Hospital induced delirium. As needed Haldol and nightly Zyprexa.  Possible aspiration pneumonia. I am not convinced that the patient actually has a pneumonia.  Patient was started on antibiotics which I will continue. Switch cefepime to Zosyn.  Renal function.  AKI on CKD 3A. Renal function elevated. Monitor.  CAD. Stable. On statin.  Anticoagulation currently on hold.  Chronic combined CHF. Hypertension. Blood pressure soft. Currently all antihypertensive regimen is currently on hold. Appears to be  euvolemic for now. Monitor.  Type II DM with hyperglycemia with long-term insulin use. Currently continuing basal bolus regimen. Hemoglobin A1c 11.2. Monitor.  Obesity Body mass index is 33.19 kg/m.  Placing the pt at higher risk of poor outcomes.  Subjective: No nausea no vomiting no fever no chills.  Mentation improving.  Physical Exam: General: Appear in mild distress; Cardiovascular: S1 and S2 Present, no Murmur, Respiratory: good respiratory effort, Bilateral Air entry present, CTA, no Crackles, no wheezes Abdomen: Bowel Sound present, Non tender  Extremities: no Pedal edema Neurology: alert and oriented to time, place, and person    Data Reviewed: I have Reviewed nursing notes, Vitals, and Lab results. Since last encounter, pertinent lab results CBC and BMP   . I have ordered test including CBC and BMP  .   I have discussed with hand surgery.  Disposition: Status is: Inpatient Remains inpatient appropriate because: Needs stable serum creatinine and hemoglobin while on anticoagulation.  Place and maintain sequential compression device Start: 11/24/22 1724 SCDs Start: 11/16/22 1423   Family Communication: Discussed with wife at bedside Level of care: Progressive continue progressive care unit for now due to hypotension. Vitals:   11/26/22 1254 11/26/22 2100 11/27/22 0540 11/27/22 1203  BP: (!) 100/49 (!) 101/50 (!) 115/55 118/60  Pulse: 60 76 72 77  Resp: '18 18 18 18  '$ Temp: 98.3 F (36.8 C) 98 F (36.7 C) 98.6 F (37 C) 98 F (36.7 C)  TempSrc: Oral Oral Oral Oral  SpO2: 97% 99% 97% 95%  Weight:      Height:         Author: Berle Mull, MD 11/27/2022 7:17 PM  Please look on [email or web address] to find out who is on call.

## 2022-11-27 NOTE — Discharge Instructions (Signed)
Dial soap soaks  Supplies needed: -Liquid Dial Antibacterial soap -clean basin large enough to submerge wound (to be rinsed and dried between uses) -clean dry towel -non-adhesive bandage (Telfa or other) -Sterile gauze -comfortable tape or self adhesive wrap   Perform 3 times per day: Soak wound in warm soapy water for 15 minutes in clean basin Gently pat dry Cover with non-adhesive bandage (Telfa) Cover with fresh sterile gauze and loose wrap

## 2022-11-27 NOTE — Plan of Care (Signed)

## 2022-11-27 NOTE — Care Management Important Message (Signed)
Important Message  Patient Details IM Letter given Name: Wayne [name] MRN: [id number] Date of Birth: November 18, 1941   Medicare Important Message Given:  Yes     Kerin Salen 11/27/2022, 1:36 PM

## 2022-11-27 NOTE — Consult Note (Signed)
Orthopaedic Surgery Hand and Upper Extremity History and Physical Examination 11/27/2022  Referring Provider: No referring provider defined for this encounter.  CC: Right thumb infection  HPI: Wayne [name] is a 81 y.o. male who is admitted to the hospital for hematuria who has been admitted since 12/3 for cystoscopy.  At some point, he developed an infection on his right thumb.  He does not remember exactly when that was.  His wife thinks it may have been present before the first surgery.  He reports pain and tenderness around the thumb.  Denies numbness and tingling.   Past Medical History: Past Medical History:  Diagnosis Date   AICD (automatic cardioverter/defibrillator) present    Arthritis    Bladder cancer ([place])    Cancer ([place])    bladder and prostate   Cellulitis of left leg    a. 06/9891 complicated by septic shock   Chronic systolic CHF (congestive heart failure) (HCC)    CKD (chronic kidney disease), stage III ([place])    pt denies   Colon polyps    CORONARY ATHEROSCLEROSIS NATIVE CORONARY ARTERY cardiologist-  dr Aundra Dubin   a. 01/2011 Cath/PCI: LM nl, LAD 40-50p, D1 80-small, LCX 95-small, RI 90, RCA 100, EF 20%;  b. 01/2011 Card MRI - No transmural scar;  c. 01/2011 PCI RCA->5 Promus DES, RI->3.0x16 Promus DES; d. Cath 01/13/13 patent LAD & Ramus, diffuse LCx dz, RCA mult overlapping stents w/ 95% osital stenosis, EF 20%, s/p DES to ostial/prox RCA 01/24/13    Diabetes mellitus, type 2 (HCC)    type 2   Hematoma of leg    a. left leg hematoma 03/2012 in the setting of asa/effient   Herpes zoster ophthalmicus    neuropathy   History of anemia    History of kidney stones    History of non-ST elevation myocardial infarction (NSTEMI) 06/2012   History of prostate cancer    dx 2009---  s/p radioactive seed implants 11-24-2008   HYPERLIPIDEMIA    intolerant to Lipitor (myalgias)   HYPERTENSION    Ischemic cardiomyopathy    a. 01/2012 Echo EF 45%, mild LVH; b. EF15%, grade 1  diastolic dysfunction, diffuse hypokinesis, inferoposterior akinesis    Myocardial infarction ([place])    x2   Noncompliance    Obesity    OSA (obstructive sleep apnea)    dx 2012  severe osa  no cpap just uses nose strips   PAF (paroxysmal atrial fibrillation) ([place])    first dx 01-16-2017   Peripheral neuropathy    Polymyalgia rheumatica (HCC)    Prostate cancer (HCC)    S/P drug eluting coronary stent placement    total 6 DES     Medications: Scheduled Meds:  Chlorhexidine Gluconate Cloth  6 each Topical Daily   doxycycline  100 mg Oral Q12H   fluconazole  100 mg Oral Daily   insulin aspart  0-15 Units Subcutaneous TID WC   insulin glargine-yfgn  15 Units Subcutaneous Daily   lidocaine  5 mL Infiltration Once   oxybutynin  5 mg Oral TID   Ensure Max Protein  11 oz Oral Daily   rosuvastatin  40 mg Oral q AM   senna-docusate  1 tablet Oral BID   Continuous Infusions:  sodium chloride Stopped (11/20/22 2044)   lactated ringers 75 mL/hr at 11/27/22 1436   piperacillin-tazobactam (ZOSYN)  IV 3.375 g (11/27/22 1436)   sodium chloride irrigation     PRN Meds:.sodium chloride, acetaminophen **OR** acetaminophen, haloperidol lactate, ondansetron **  OR** ondansetron (ZOFRAN) IV, mouth rinse, oxyCODONE, phenol, polyethylene glycol  Allergies: Allergies as of 11/16/2022 - Review Complete 11/16/2022  Allergen Reaction Noted   Entresto [sacubitril-valsartan] Swelling and Other (See Comments) 04/10/2016   Crestor [rosuvastatin calcium] Other (See Comments) 07/14/2016   Lipitor [atorvastatin] Other (See Comments) 06/20/2009   Plavix [clopidogrel] Hives, Itching, Swelling, and Other (See Comments)    Ancef [cefazolin] Itching, Rash, and Other (See Comments) 06/28/2012    Past Surgical History: Past Surgical History:  Procedure Laterality Date   CARDIAC CATHETERIZATION  01/24/2013   CARDIAC CATHETERIZATION N/A 11/14/2016   Procedure: Right/Left Heart Cath and Coronary Angiography;   Surgeon: Larey Dresser, MD;  Location: [facility];  Service: Cardiovascular;  Laterality: N/A;   CARDIAC CATHETERIZATION N/A 11/17/2016   Procedure: Coronary Stent Intervention w/Impella;  Surgeon: Peter M Martinique, MD;  Location: [facility];  Service: Cardiovascular;  Laterality: N/A;   CARDIAC CATHETERIZATION N/A 11/17/2016   Procedure: Coronary Atherectomy;  Surgeon: Peter M Martinique, MD;  Location: [facility];  Service: Cardiovascular;  Laterality: N/A;   CORONARY ANGIOPLASTY WITH STENT PLACEMENT  01-30-2011   dr Aundra Dubin   PTCA and multiple overlapping DEStenting to RCA and 1 DES to RI    CORONARY BALLOON ANGIOPLASTY N/A 07/06/2017   Procedure: Coronary Balloon Angioplasty;  Surgeon: Sherren Mocha, MD;  Location: [facility];  Service: Cardiovascular;  Laterality: N/A;   CYSTOSCOPY N/A 11/25/2022   Procedure: CYSTOSCOPY WITH BILATERAL RETROGRADE AND BILATERAL STENT PLACEMENT;  Surgeon: Festus Aloe, MD;  Location: [facility];  Service: Urology;  Laterality: N/A;  60 MINUTES NEEDED FOR CASE   CYSTOSCOPY WITH FULGERATION N/A 05/25/2018   Procedure: CYSTOSCOPY WITH FULGERATION/BLADDER BIOPSY , 15 mm lesion;  Surgeon: Festus Aloe, MD;  Location: [facility];  Service: Urology;  Laterality: N/A;   CYSTOSCOPY WITH FULGERATION N/A 11/16/2022   Procedure: CYSTOSCOPY WITH FULGERATION; CLOT EVACUATION;  Surgeon: Ceasar Mons, MD;  [facility];  Service: Urology;  Laterality: N/A;   CYSTOSCOPY WITH URETHRAL DILATATION N/A 08/09/2021   Procedure: CYSTOSCOPY WITH URETHRAL DILATATION WITH OPTILUME;  Surgeon: Festus Aloe, MD;  [facility];  Service: Urology;  Laterality: N/A;   CYSTOSCOPY WITH URETHRAL DILATATION N/A 07/01/2022   Procedure: CYSTOSCOPY WITH BALLOON DILATATION OF PROSTATIC URETHRA;  Surgeon: Festus Aloe, MD;  [facility];  Service: Urology;  Laterality: N/A;   I & D EXTREMITY  06/15/2012   Procedure: IRRIGATION AND DEBRIDEMENT  EXTREMITY;  Surgeon: Newt Minion, MD;  Location: [facility];  Service: Orthopedics;  Laterality: Left;  I&D Left Posterior Knee   I & D EXTREMITY  06/30/2012   Procedure: IRRIGATION AND DEBRIDEMENT EXTREMITY;  Surgeon: Newt Minion, MD;  Location: [facility];  Service: Orthopedics;  Laterality: Left;  Left Leg Irrigation and Debridement and placement of Wound VAC and application of  A-cell   I & D EXTREMITY  07/20/2012   Procedure: IRRIGATION AND DEBRIDEMENT EXTREMITY;  Surgeon: Newt Minion, MD;  Location: [facility];  Service: Orthopedics;  Laterality: Left;  Irrigation and Debridement Left Leg and Place antibiotic beads    ICD IMPLANT N/A 02/12/2017   Procedure: ICD Implant;  Surgeon: Evans Lance, MD;  Location: [facility];  Service: Cardiovascular;  Laterality: N/A;   LAPAROSCOPIC INGUINAL HERNIA REPAIR Right 09-11-2008   dr Barry Dienes  [facility]   LEAD REVISION/REPAIR N/A 05/26/2017   Procedure: Lead Revision/Repair;  Surgeon: Evans Lance, MD;  [facility]: [facility]  INVAS[facility];  Service: Cardiovascular;  Laterality: N/A;   LEFT HEART CATH AND CORONARY ANGIOGRAPHY N/A 07/06/2017   Procedure: Left Heart Cath and Coronary Angiography;  Surgeon: Larey Dresser, MD;  Location: [facility];  Service: Cardiovascular;  Laterality: N/A;   PERCUTANEOUS CORONARY STENT INTERVENTION (PCI-S) N/A 01/24/2013   Procedure: PERCUTANEOUS CORONARY STENT INTERVENTION (PCI-S);  Surgeon: Wellington Hampshire, MD;  [facility];  Service: Cardiovascular;  Laterality: N/A;   RADIOACTIVE PROSTATE SEED IMPLANTS  11-24-2008   dr Tresa Endo  [place] TUMOR N/A 10/30/2017   Procedure: TRANSURETHRAL RESECTION OF BLADDER TUMOR (TURBT)/ INSTILLATION OF EPIRUBICIN;  Surgeon: Festus Aloe, MD;  [facility];  Service: Urology;  Laterality: N/A;   TRANSURETHRAL RESECTION OF BLADDER TUMOR N/A 05/18/2020   Procedure: TRANSURETHRAL RESECTION OF BLADDER TUMOR (TURBT) 0.5-2.0 cm BILATERAL  RETROGRADE PYELOGRAM;  Surgeon: Festus Aloe, MD;  [facility];  Service: Urology;  Laterality: N/A;   TRANSURETHRAL RESECTION OF BLADDER TUMOR N/A 11/25/2022   Procedure: TRANSURETHRAL RESECTION OF BLADDER TUMOR (TURBT);  Surgeon: Festus Aloe, MD;  [facility];  Service: Urology;  Laterality: N/A;   TRANSURETHRAL RESECTION OF PROSTATE N/A 11/25/2022   Procedure: TRANSURETHRAL RESECTION OF THE PROSTATE (TURP);  Surgeon: Festus Aloe, MD;  [facility];  Service: Urology;  Laterality: N/A;   ULTRASOUND GUIDANCE FOR VASCULAR ACCESS  11/14/2016   Procedure: Ultrasound Guidance For Vascular Access;  Surgeon: Larey Dresser, MD;  Location: [place] [facility];  Service: Cardiovascular;;   V TACH ABLATION N/A 06/19/2017   Procedure: V Tach Ablation;  Surgeon: Evans Lance, MD;  Location: [facility] [facility];  Service: Cardiovascular;  Laterality: N/A;     Social History: Social History   Occupational History   Occupation: RETIRED    Employer: RETIRED    Comment: TRUCK DRIVER  Tobacco Use   Smoking status: Former    Packs/day: 0.30    Years: 20.00    Total pack years: 6.00    Types: Cigarettes    Quit date: 02/23/1989    Years since quitting: 33.7   Smokeless tobacco: Never  Vaping Use   Vaping Use: Never used  Substance and Sexual Activity   Alcohol use: No   Drug use: No   Sexual activity: Yes     Family History: Family History  Problem Relation Age of Onset   Cancer Mother 64       unknown CA   Heart disease Father    Alcohol abuse Father    Heart attack Father 23   Heart attack Brother    Otherwise, no relevant orthopaedic family history  ROS: Review of Systems: All systems reviewed and are negative except that mentioned in HPI  Work/Sport/Hobbies: See HPI  Physical Examination: Vitals:   11/27/22 0540 11/27/22 1203  BP: (!) 115/55 118/60  Pulse: 72 77  Resp: 18 18  Temp: 98.6 F (37 C) 98 F (36.7 C)  SpO2: 97% 95%    Constitutional: Awake, alert.  WN/WD Appearance: healthy, no acute distress, well-groomed Affect: Normal HEENT: EOMI, mucous membranes moist CV: RRR Pulm: breathing comfortably  Neck:   FROM, no pain  Right Upper Extremity / Hand There is a paronychia on the right thumb.  No other wounds.  Atrophy of the thenar musculature.  Tender to palpation around the paronychia.  Able to flex and extend at thumb IP and MCP joints. Decreased sensation over the paronychia, otherwise  intact.  Motor intact throughout.  Warm and well perfused fingers.   Pertinent Labs: n/a  Imaging: Right thumb XR: No fractures, dislocations, or acute osseous abnormalities  Additional Studies: n/a  Assessment/Plan: Problem with Foley catheter, initial encounter ([place])  Obstructive uropathy  Paronychia  Discussed with the patient that he has an infection of the right thumb.  The infection will need to be lanced.  See procedure note below.  Please have patient start daily PT hydrotherapy while admitted.  Continue antibiotics per primary team.  Cultures sent.  On discharge, please have patient perform TID Dial Soap Soaks (see instructions listed below and in d/c instructions).  Please call my office with any questions or concerns.   Procedure note: A digital nerve block on both ulnar and radial and volar and dorsal aspects of the right thumb was performed with 10cc 1% lidocaine.  After the thumb was numb, an 11 blade scalpel was used to lance the ulnar aspect of the eponychium, under nail plate and radial border of thumb.  Copious purulence was encountered.  Cultures were obtained.  The wounds were irrigated thoroughly with normal saline and then left open to drain.  The patient tolerated the procedure well.    Wadie Lessen, MD Hand and Upper Extremity Surgery [facility] [place] 11/27/2022 7:20 PM   Dial soap soaks  Supplies needed: -Liquid Dial Antibacterial  soap -clean basin large enough to submerge wound (to be rinsed and dried between uses) -clean dry towel -non-adhesive bandage (Telfa or other) -Sterile gauze -comfortable tape or self adhesive wrap   Perform 3 times per day: Soak wound in warm soapy water for 15 minutes in clean basin Gently pat dry Cover with non-adhesive bandage (Telfa) Cover with fresh sterile gauze and loose wrap

## 2022-11-27 NOTE — Progress Notes (Signed)
S: Pt seem today at lunch. Alert and awake today. No complaints  O: Eating lunch Alert and oriented  Urine light pink   A: -bladder mass - s/p TURP/TURBT and bilateral ureteral stents 11/25/2022. path pending. Will be interested to see how path correlates to LN bx.   -LAD - bx resulted today as lymphoma/SLL. Heme/onc consult planned.   P: I will d/c foley in AM if urine remains clear

## 2022-11-28 DIAGNOSIS — N139 Obstructive and reflux uropathy, unspecified: Secondary | ICD-10-CM | POA: Diagnosis not present

## 2022-11-28 DIAGNOSIS — L03011 Cellulitis of right finger: Secondary | ICD-10-CM

## 2022-11-28 LAB — GLUCOSE, CAPILLARY
Glucose-Capillary: 142 mg/dL — ABNORMAL HIGH (ref 70–99)
Glucose-Capillary: 157 mg/dL — ABNORMAL HIGH (ref 70–99)
Glucose-Capillary: 126 mg/dL — ABNORMAL HIGH (ref 70–99)
Glucose-Capillary: 116 mg/dL — ABNORMAL HIGH (ref 70–99)

## 2022-11-28 LAB — MAGNESIUM: Magnesium: 2.2 mg/dL (ref 1.7–2.4)

## 2022-11-28 LAB — CBC
HCT: 28.7 % — ABNORMAL LOW (ref 39.0–52.0)
Hemoglobin: 8.9 g/dL — ABNORMAL LOW (ref 13.0–17.0)
MCH: 27.7 pg (ref 26.0–34.0)
MCHC: 31 g/dL (ref 30.0–36.0)
MCV: 89.4 fL (ref 80.0–100.0)
Platelets: 238 10*3/uL (ref 150–400)
RBC: 3.21 MIL/uL — ABNORMAL LOW (ref 4.22–5.81)
RDW: 13.6 % (ref 11.5–15.5)
WBC: 8.8 10*3/uL (ref 4.0–10.5)
nRBC: 0 % (ref 0.0–0.2)

## 2022-11-28 LAB — RENAL FUNCTION PANEL
Albumin: 2.4 g/dL — ABNORMAL LOW (ref 3.5–5.0)
Anion gap: 8 (ref 5–15)
BUN: 29 mg/dL — ABNORMAL HIGH (ref 8–23)
CO2: 24 mmol/L (ref 22–32)
Calcium: 8.8 mg/dL — ABNORMAL LOW (ref 8.9–10.3)
Chloride: 107 mmol/L (ref 98–111)
Creatinine, Ser: 1.67 mg/dL — ABNORMAL HIGH (ref 0.61–1.24)
GFR, Estimated: 41 mL/min — ABNORMAL LOW (ref >60)
Glucose, Bld: 138 mg/dL — ABNORMAL HIGH (ref 70–99)
Phosphorus: 2 mg/dL — ABNORMAL LOW (ref 2.5–4.6)
Potassium: 3.7 mmol/L (ref 3.5–5.1)
Sodium: 139 mmol/L (ref 135–145)

## 2022-11-28 LAB — SURGICAL PATHOLOGY

## 2022-11-28 MED ORDER — AMOXICILLIN-POT CLAVULANATE 875-125 MG PO TABS
1.0000 | ORAL_TABLET | Freq: Two times a day (BID) | ORAL | Status: DC
  Administered 2022-11-28 – 2022-12-07 (×18): 1 via ORAL
  Filled 2022-11-28 (×18): qty 1

## 2022-11-28 MED ORDER — ENOXAPARIN SODIUM 100 MG/ML IJ SOSY
100.0000 mg | PREFILLED_SYRINGE | Freq: Two times a day (BID) | INTRAMUSCULAR | Status: DC
  Administered 2022-11-28 – 2022-11-30 (×5): 100 mg via SUBCUTANEOUS
  Filled 2022-11-28 (×5): qty 1

## 2022-11-28 NOTE — Evaluation (Signed)
Physical Therapy Evaluation Patient Details Name: Wayne [name] MRN: [id number] DOB: 1941-08-15 Today's Date: 11/28/2022  History of Present Illness  Patient is 81 y.o. male who presented to hospital with complaints of hematuria and catheter dysfunction. Pt underwent cystoscopy on 10/9 with complications of recurrent clots in catheter, recurrent gross hematuria and increasing pelvic lymphadenopathy. Pt underwent TURP, TURBT, bilateral retrograde pyelogram, bilateral ureteral stent placement on 12/12. Biopsies revealed SLL/CLL in Lt axillary nodes, SCC in Lt iliac/pelvic nodes, and SCC of prostate and bladder. During stay pt also developed infection of Rt thumb and underwent bedside debridement by orthopedics on 12/14. PMH significant for CAD SP PCI, PAF on Eliquis, HTN, chronic combined CHF, OSA, neuropathy, type II DM, PMR, prostate cancer SP brachytherapy, bladder cancer, AICD implant.    Clinical Impression  Wayne [name] is 81 y.o. male admitted with above HPI and diagnosis. Patient is currently limited by functional impairments below (see PT problem list). Patient lives with his family and has been requiring RW for mobility at home due to gradual decline in last ~1 year. Currently pt requires min assist for safety with transfers and gait and cues for safety due to bil LE neuropathy. Patient will benefit from continued skilled PT interventions to address impairments and progress independence with mobility, recommending HHPT with assist from family. Acute PT will follow and progress as able.        Recommendations for follow up therapy are one component of a multi-disciplinary discharge planning process, led by the attending physician.  Recommendations may be updated based on patient status, additional functional criteria and insurance authorization.  PT Recommendation   Follow Up Recommendations Home health PT Filed 11/28/2022 1326  Assistance recommended at discharge Frequent or constant  Supervision/Assistance Filed 11/28/2022 1326  Patient can return home with the following A little help with walking and/or transfers, A little help with bathing/dressing/bathroom, Assistance with cooking/housework, Direct supervision/assist for medications management, Assist for transportation, Help with stairs or ramp for entrance [facility] 11/28/2022 1326  Functional Status Assessment Patient has had a recent decline in their functional status and demonstrates the ability to make significant improvements in function in a reasonable and predictable amount of time. Filed 11/28/2022 1326  PT equipment None recommended by PT [facility] 11/28/2022 1326   Functional Status Assessment       Precautions / Restrictions        Mobility  Bed Mobility Overal bed mobility: Needs Assistance Bed Mobility: Supine to Sit     Supine to sit: Min assist, HOB elevated     General bed mobility comments: light assist with bed pad to scoot to EOB. cues to use rails for supine>sit.    Transfers Overall transfer level: Needs assistance Equipment used: Rolling walker (2 wheels) Transfers: Sit to/from Stand, Bed to chair/wheelchair/BSC Sit to Stand: Min assist   Step pivot transfers: Min assist       General transfer comment: assit to rise and steady. pt complete sit<>stand from EOB and BSC>bed transfer at EOS.    Ambulation/Gait Ambulation/Gait assistance: Min assist Gait Distance (Feet): 70 Feet Assistive device: Rolling walker (2 wheels) Gait Pattern/deviations: Step-through pattern, Decreased stride length, Decreased dorsiflexion - right, Decreased dorsiflexion - left, Shuffle, Trunk flexed, Wide base of support Gait velocity: decr     General Gait Details: overall mildly unsteady with poor awareness of step pattern secondary to neuropathy. Pt lifting walker on occasion and placing back post on foot, cues required to not lift walker and to watch feet  to ensure no injury to  feet secondary to impaired  sensation. poor dorsiflexion and shuffled step pattern.  Stairs            Wheelchair Mobility    Modified Rankin (Stroke Patients Only)         11/28/22 1326  PT - End of Session  Equipment Utilized During Treatment Gait belt  Activity Tolerance Patient tolerated treatment well  Patient left in bed;with call bell/phone within reach;with bed alarm set  Nurse Communication Mobility status  PT Assessment  PT Recommendation/Assessment Patient needs continued PT services  PT Visit Diagnosis Unsteadiness on feet (R26.81);Muscle weakness (generalized) (M62.81);Difficulty in walking, not elsewhere classified (R26.2)  PT Problem List Decreased strength;Decreased activity tolerance;Decreased balance;Decreased mobility;Decreased cognition;Decreased knowledge of use of DME;Decreased safety awareness;Decreased knowledge of precautions;Impaired sensation  PT Plan  PT Frequency (ACUTE ONLY) Min 3X/week  PT Treatment/Interventions (ACUTE ONLY) DME instruction;Gait training;Stair training;Functional mobility training;Therapeutic activities;Therapeutic exercise;Balance training;Patient/family education  AM-PAC PT "6 Clicks" Mobility Outcome Measure (Version 2)  Help needed turning from your back to your side while in a flat bed without using bedrails? 3  Help needed moving from lying on your back to sitting on the side of a flat bed without using bedrails? 3  Help needed moving to and from a bed to a chair (including a wheelchair)? 3  Help needed standing up from a chair using your arms (e.g., wheelchair or bedside chair)? 3  Help needed to walk in hospital room? 3  Help needed climbing 3-5 steps with a railing?  1  6 Click Score 16  Consider Recommendation of Discharge To: [facility] with [facility]  Progressive Mobility  What is the highest level of mobility based on the progressive mobility assessment? Level 5 (Walks with assist in room/hall) - Balance while stepping forward/back and can walk in room  with assist - Complete  Mobility Referral Yes  Activity Ambulated with assistance in hallway  PT Recommendation  Follow Up Recommendations Home health PT  Assistance recommended at discharge Frequent or constant Supervision/Assistance  Patient can return home with the following A little help with walking and/or transfers;A little help with bathing/dressing/bathroom;Assistance with cooking/housework;Direct supervision/assist for medications management;Assist for transportation;Help with stairs or ramp for entrance  Functional Status Assessment Patient has had a recent decline in their functional status and demonstrates the ability to make significant improvements in function in a reasonable and predictable amount of time.  PT equipment None recommended by PT  Individuals Consulted  Consulted and Agree with Results and Recommendations Patient  Acute Rehab PT Goals  Patient Stated Goal maintain strength  PT Goal Formulation With patient  Time For Goal Achievement 12/12/22  Potential to Achieve Goals Good  PT Time Calculation  PT Start Time (ACUTE ONLY) 1306  PT Stop Time (ACUTE ONLY) 1338  PT Time Calculation (min) (ACUTE ONLY) 32 min  PT General Charges  $$ ACUTE PT VISIT 1 Visit  PT Evaluation  $PT Eval Moderate Complexity 1 Mod  PT Treatments  $Gait Training 8-22 mins     Verner Mould, DPT Acute Rehabilitation Services Office [phone]  11/28/22 5:39 PM

## 2022-11-28 NOTE — Consult Note (Signed)
Hematology/Oncology Consult Note  Clinical Summary: [name] is a 81 year old male with medical history significant for prostate cancer and bladder cancer who presents with newly diagnosed small cell cancer of the prostate as well as CLL/SLL of upper extremity lymph nodes.  Reason for Consult: New diagnosis of small cell cancer of the prostate and CLL/SLL  HPI: [name] is a 81 year old male with medical history significant for prostate cancer and bladder cancer who presented on 11/16/2022 to the [facility] emergency department due to having obstructing blood clots in his Foley catheter.  He had undergone a PET CT scan on 11/12/2022 which showed a hypermetabolic soft tissue mass centered around the posterior bladder and superior aspect of the prostate.  He also had hypermetabolic pelvic adenopathy and cervical and thoracic adenopathy as well.  It was thought that these represented 2 separate processes.  He underwent a  TURP/TURBT on 11/25/2022.  Additionally he underwent a axillary lymph node biopsy and pelvic lymph node biopsy, with the axillary node showing SLL/CLL and the prostate procedure and pelvic lymph node showing small cell carcinoma.  On exam today Mr. [name] notes that he continues to feel poorly.  He continues to pass pinkish urine.  He reports he also developed a infection on his right thumb which is currently bandaged.  He notes he is not currently in any pain but is still unable to walk.  He notes his appetite is poor and overall his energy is low.  His primary issue is weakness.  He otherwise denies any fevers, chills, sweats, nausea, vomiting or diarrhea.  A full 10 point ROS was otherwise negative.  O:  Vitals:   11/28/22 2138 11/29/22 0658  BP: (!) 115/57 108/71  Pulse: 74 77  Resp: 17 18  Temp: 98.4 F (36.9 C) 98.5 F (36.9 C)  SpO2: 94% 94%      Latest Ref Rng & Units 11/28/2022    5:02 AM 11/27/2022    4:44 AM 11/26/2022    4:47 AM  CMP  Glucose 70 - 99  mg/dL 138  190  189   BUN 8 - 23 mg/dL 29  39  37   Creatinine 0.61 - 1.24 mg/dL 1.67  1.98  2.22   Sodium 135 - 145 mmol/L 139  138  139   Potassium 3.5 - 5.1 mmol/L 3.7  4.6  5.2   Chloride 98 - 111 mmol/L 107  108  108   CO2 22 - 32 mmol/L '24  23  17   '$ Calcium 8.9 - 10.3 mg/dL 8.8  8.7  8.7       Latest Ref Rng & Units 11/29/2022    6:24 AM 11/28/2022    5:02 AM 11/27/2022    4:44 AM  CBC  WBC 4.0 - 10.5 K/uL 8.8  8.8  13.6   Hemoglobin 13.0 - 17.0 g/dL 9.6  8.9  9.0   Hematocrit 39.0 - 52.0 % 30.7  28.7  28.7   Platelets 150 - 400 K/uL 256  238  241       GENERAL: Chronically ill-appearing elderly Caucasian male in NAD  SKIN: skin color, texture, turgor are normal, no rashes or significant lesions EYES: conjunctiva are pink and non-injected, sclera clear LUNGS: clear to auscultation and percussion with normal breathing effort HEART: regular rate & rhythm and no murmurs and no lower extremity edema Musculoskeletal: no cyanosis of digits and no clubbing  PSYCH: alert & oriented x 3, fluent speech NEURO: no focal  motor/sensory deficits  Assessment/Plan:  # Small Cell Cancer of the Prostate, Metastatic  -- Pathology results from prostate chips as well as bladder mass resection are most consistent with a small cell carcinoma. TURP/TURBT performed on 11/25/2022.  -- We will order PSA as well as testosterone levels -- Prior PET CT scan imaging is consistent with metastatic disease, with involvement of the lymph nodes, now biopsy-proven, of the pelvis.  Other lymph nodes in the upper extremities more consistent with SLL involvement. -- Treatment of choice involves carboplatin and etoposide. -- Patient notes he has badly deconditioned and unsure if he wants to proceed with chemotherapy.  He will discuss this with his family and get back to [place]. -- We offered a comfort care based approach given the aggressive nature of small cell carcinoma and the overall poor prognosis. -- Oncology  will continue to follow.  # SLL of Right Axillary Lymph Node -- SLL is an indolent lymphoma, indistinguishable from CLL. -- At this time the patient does have anemia, though will rule out other possible causes as the etiology.  If no other etiology is noted may need to consider treatment for SLL. -- We will conduct a full anemia workup to include reticulocyte panel, vitamin B12, iron panel, ferritin -- Oncology will continue to follow.   [name], MD Department of Hematology/Oncology [place] at [facility] Phone: [phone] Pager: [phone] Email: [email or web address]

## 2022-11-28 NOTE — Progress Notes (Signed)
Physical Therapy Wound Treatment Patient Details  Name: Wayne [name] MRN: [id number] Date of Birth: 07/03/1941  Today's Date: 11/28/2022 Time: 1239-1306 Time Calculation (min): 27 min  Subjective  Subjective Assessment Subjective: pt reports thumb feels much better since yesterday. Patient and Family Stated Goals: thumb wound to heal Date of Onset: 11/27/22 Prior Treatments: bedside debridement by orthopedics on 11/27/22.  Pain Score:    Wound Assessment   Wound Assessment and Plan  Wound Therapy - Assess/Plan/Recommendations Wound Therapy - Clinical Statement: Patient being evaluated for Rt thumb wound due to local infection and bedside debridement by orthopedics. Wound is clean and drainage is minimal and serosanguineous. There is no odor, errythema is minimal and blanchable in periwound. Pt report no longer having pain in digit. Small purple/red area proximal and medial to nail bed of 1x1.4x0.1. Kin cleansed with saline and barrier wipe used to protect from maceration. Xeroform cut to fit over wound area and thumb wrapped with guaze and ace wrap to maintain position. RN informed of plan for daily dressing change to cleanse thumb and replace xeroform. If skin become macerated discontinue use of xeroform and simply wrap gauze to maintain clean healing environment. Patient has no skilled PT hydrotherapy needs at this time. Wound Therapy - Functional Problem List: multiple medical problems, infection, decreased immune function Factors Delaying/Impairing Wound Healing: Altered sensation, Infection - systemic/local, Multiple medical problems Hydrotherapy Plan: Dressing change, Patient/family education Wound Therapy - Frequency:  (1x eval) Wound Therapy - Follow Up Recommendations: dressing changes by RN, dressing changes by family/patient  Wound Therapy Goals- Improve the function of patient's integumentary system by progressing the wound(s) through the phases of wound healing (inflammation  - proliferation - remodeling) by:    Goals will be updated until maximal potential achieved or discharge criteria met.  Discharge criteria: when goals achieved, discharge from hospital, MD decision/surgical intervention, no progress towards goals, refusal/missing three consecutive treatments without notification or medical reason.  GP     Charges PT Wound Care Charges $PT Hydrotherapy Dressing: 1 dressing $PT Hydrotherapy Visit: 1 Visit       Q. PT, DPT Acute Rehabilitation Services Office [phone]  11/28/22 5:45 PM  

## 2022-11-28 NOTE — Progress Notes (Signed)
S: no specific complaint.   O: Alert and oriented.  Urine clear in tubing   FINAL MICROSCOPIC DIAGNOSIS:   A. LYMPH NODE, LEFT AXILLARY, BIOPSY:  -  Small lymphocytic lymphoma/chronic lymphocytic leukemia, see note.   B. LYMPH NODE, LEFT ILIAC, LEFT PELVIC, BIOPSY:  -  Metastatic small cell carcinoma.   FINAL MICROSCOPIC DIAGNOSIS:   A. PROSTATE CHIPS, TURP:  Small cell carcinoma involves 90% of the resected tissue.  Consistent with prostate primary. See comment   B. BLADDER, RIGHT POSTERIOR, RESECTION:  Small cell carcinoma  The carcinoma invades detrusor muscle   A/P:  1) gross hematuria  - urine clear - foley removed  2) small cell carcinoma of prostate metastatic to pelvic lymph nodes - I discussed with pathologist Dr. Orene Desanctis and with [facility] [name]. Discussed with patient, daughter and wife. Discussed stage, grade and prognosis. Oncology consult pending.   3) Small lymphocytic lymphoma/chronic lymphocytic leukemia - of axillary nodes. As above.   Will follow.

## 2022-11-28 NOTE — Progress Notes (Signed)
[place] for Enoxaparin Indication: atrial fibrillation  Allergies  Allergen Reactions   Entresto [Sacubitril-Valsartan] Swelling and Other (See Comments)    Angioedema   Crestor [Rosuvastatin Calcium] Other (See Comments)    Myalgia and Interferes with gait in high doses   Lipitor [Atorvastatin] Other (See Comments)    Makes legs sore   Plavix [Clopidogrel] Hives, Itching, Swelling and Other (See Comments)    Nose bleeds and welts on legs & back   Ancef [Cefazolin] Itching, Rash and Other (See Comments)    Describes itching and rash, but said that "it wasn't that bad"    Patient Measurements: Height: '5\' 8"'$  (172.7 cm) Weight: 99 kg (218 lb 4.1 oz) IBW/kg (Calculated) : 68.4 Heparin Dosing Weight:   Vital Signs: Temp: 98.7 F (37.1 C) (12/15 0544) Temp Source: Oral (12/15 0544) BP: 96/46 (12/15 0544) Pulse Rate: 72 (12/15 0544)  Labs: Recent Labs    11/26/22 0447 11/27/22 0444 11/28/22 0502  HGB 9.3* 9.0* 8.9*  HCT 30.6* 28.7* 28.7*  PLT 200 241 238  CREATININE 2.22* 1.98* 1.67*    Estimated Creatinine Clearance: 40.9 mL/min (A) (by C-G formula based on SCr of 1.67 mg/dL (H)).   Medications:  Scheduled:   Chlorhexidine Gluconate Cloth  6 each Topical Daily   doxycycline  100 mg Oral Q12H   fluconazole  100 mg Oral Daily   insulin aspart  0-15 Units Subcutaneous TID WC   insulin glargine-yfgn  15 Units Subcutaneous Daily   lidocaine  5 mL Infiltration Once   oxybutynin  5 mg Oral TID   Ensure Max Protein  11 oz Oral Daily   rosuvastatin  40 mg Oral q AM   senna-docusate  1 tablet Oral BID   Infusions:   sodium chloride Stopped (11/20/22 2044)   lactated ringers 75 mL/hr at 11/28/22 0646   piperacillin-tazobactam (ZOSYN)  IV 3.375 g (11/28/22 5409)   sodium chloride irrigation      Assessment: 81 y.o. male admitted on 11/16/2022 with hematuria, s/p urology intervention and is being treated with fluconazole for  yeast UTI.  PMH is significant for Afib on apixaban anticoagulation.  Apixaban was held on admit for hematuria, resumed on 12/6, but transitioned to [place] on 12/8 due to drug-drug interaction with fluconazole.  Anticoag was held on 12/10 due to recurrent gross hematuria with clots.  S/p TURP/TURBT and ureteral stents 11/25/22.   Pharmacy is now consulted to resume enoxaparin on 12/15 with transition back to home Eliquis when fluconazole course is completed.    Today, 11/28/2022: SCr down to 1.67, CrCl ~ 40 ml/min CBC:  Hgb remains relatively stable/low at 8.9, Plt WNL. Bleeding: no further hematuria reported.  Fluconazole day # 8.  Follow up stop date.    Goal of Therapy:  Anti-Xa level 0.6-1 units/ml 4hrs after LMWH dose given Monitor platelets by anticoagulation protocol: Yes   Plan:  Lovenox 1 mg/kg ('100mg'$ ) SQ Q12h Pharmacy will sign off notes, but will continue to peripherally monitor renal function and s/s bleeding.   Transition back to apixaban 5 mg PO BID when fluconazole course is completed.    Gretta Arab PharmD, BCPS [facility] main pharmacy [phone] 11/28/2022 10:25 AM

## 2022-11-28 NOTE — Progress Notes (Signed)
[facility] Progress Note Patient: Wayne [name] ZMO:[id number] DOB: [birth year] DOA: 11/16/2022  DOS: the patient was seen and examined on 11/28/2022  Brief hospital course: 81 y.o. PMH of CAD SP PCI, PAF on Eliquis, HTN, chronic combined CHF, OSA, neuropathy, type II DM, PMR, prostate cancer SP brachytherapy, bladder cancer, AICD implant. Presented to hospital with complaints of hematuria and catheter dysfunction. Urology following.  Developed hospital induced delirium. Assessment and Plan: Gross hematuria. History of bladder cancer and prostate cancer. Obstructive uropathy. Seen in ED by urology. Underwent cystoscopy. Currently urine appears to be clear. Will initiate Lovenox therapy and monitor. Treated with CBI.  Candida UTI. Currently on fluconazole. Anticoagulation had to change to Lovenox for that.  History of bladder cancer. Biopsy positive for metastatic small cell prostate cancer. Discussed with [name].  Will monitor recommendation. Otherwise management per urology.  Concern for lymphoma/SLL. Biopsy positive for possible lymphoma/SLL. Hematology consulted with [name]. Will monitor recommendation.  Concern for small cell carcinoma with metastasis Biopsy result positive for small cell carcinoma features. Hematology consulted.  Left thumb paronychia On IV Zosyn and doxycycline.  Will switch to oral Augmentin. Follow-up on cultures. Highly appreciate hand surgery consultation.  Underwent bedside debridement.  Hospital induced delirium. As needed Haldol and nightly Zyprexa.  Possible aspiration pneumonia. I am not convinced that the patient actually has a pneumonia.  Patient was started on antibiotics which we will continue given paronychia.  AKI on CKD 3A. Renal function elevated. Monitor.  CAD. Stable. On statin.  Anticoagulation currently on hold.  Chronic combined CHF. Hypertension. Blood pressure soft. Currently all antihypertensive  regimen is currently on hold. Appears to be euvolemic for now. Monitor.  Type II DM with hyperglycemia with long-term insulin use. Currently continuing basal bolus regimen. Hemoglobin A1c 11.2. Monitor.  Obesity Body mass index is 33.19 kg/m.  Placing the pt at higher risk of poor outcomes.  Subjective: mentation significantly better.  No nausea no vomiting no fever no chills.  Physical Exam: General: Appear in mild distress; Cardiovascular: S1 and S2 Present, no Murmur, Respiratory: good respiratory effort, Bilateral Air entry present, CTA, no Crackles, no wheezes Abdomen: Bowel Sound present, Non tender  Extremities: no Pedal edema Neurology: alert and oriented to time, place, and person   Data Reviewed: I have Reviewed nursing notes, Vitals, and Lab results. Since last encounter, pertinent lab results CBC and BMP   . I have ordered test including CBC and BMP  . I have discussed pt's care plan and test results with urology  .     Disposition: Status is: Inpatient Remains inpatient appropriate because: Monitor H&H after initiation of anticoagulation.  Place and maintain sequential compression device Start: 11/24/22 1724 SCDs Start: 11/16/22 1423   Family Communication: Discussed with wife at bedside Level of care: Progressive continue progressive care for now. Vitals:   11/27/22 1203 11/27/22 2035 11/28/22 0544 11/28/22 1250  BP: 118/60 (!) 97/51 (!) 96/46 (!) 105/52  Pulse: 77 78 72 73  Resp: '18 16 18 18  '$ Temp: 98 F (36.7 C) 99.1 F (37.3 C) 98.7 F (37.1 C) 98.3 F (36.8 C)  TempSrc: Oral Oral Oral Oral  SpO2: 95% 91% 92% 92%  Weight:      Height:         Author: Berle Mull, MD 11/28/2022 7:42 PM  Please look on [email or web address] to find out who is on call.

## 2022-11-29 DIAGNOSIS — N139 Obstructive and reflux uropathy, unspecified: Secondary | ICD-10-CM | POA: Diagnosis not present

## 2022-11-29 LAB — CBC
HCT: 30.7 % — ABNORMAL LOW (ref 39.0–52.0)
Hemoglobin: 9.6 g/dL — ABNORMAL LOW (ref 13.0–17.0)
MCH: 27.9 pg (ref 26.0–34.0)
MCHC: 31.3 g/dL (ref 30.0–36.0)
MCV: 89.2 fL (ref 80.0–100.0)
Platelets: 256 10*3/uL (ref 150–400)
RBC: 3.44 MIL/uL — ABNORMAL LOW (ref 4.22–5.81)
RDW: 13.6 % (ref 11.5–15.5)
WBC: 8.8 10*3/uL (ref 4.0–10.5)
nRBC: 0 % (ref 0.0–0.2)

## 2022-11-29 LAB — IRON AND TIBC
Iron: 27 ug/dL — ABNORMAL LOW (ref 45–182)
Saturation Ratios: 11 % — ABNORMAL LOW (ref 17.9–39.5)
TIBC: 242 ug/dL — ABNORMAL LOW (ref 250–450)
UIBC: 215 ug/dL

## 2022-11-29 LAB — RETIC PANEL
Immature Retic Fract: 24.1 % — ABNORMAL HIGH (ref 2.3–15.9)
RBC.: 3.39 MIL/uL — ABNORMAL LOW (ref 4.22–5.81)
Retic Count, Absolute: 96.6 10*3/uL (ref 19.0–186.0)
Retic Ct Pct: 2.9 % (ref 0.4–3.1)
Reticulocyte Hemoglobin: 28.2 pg (ref >27.9)

## 2022-11-29 LAB — RENAL FUNCTION PANEL
Albumin: 2.6 g/dL — ABNORMAL LOW (ref 3.5–5.0)
Anion gap: 9 (ref 5–15)
BUN: 26 mg/dL — ABNORMAL HIGH (ref 8–23)
CO2: 23 mmol/L (ref 22–32)
Calcium: 8.9 mg/dL (ref 8.9–10.3)
Chloride: 107 mmol/L (ref 98–111)
Creatinine, Ser: 1.51 mg/dL — ABNORMAL HIGH (ref 0.61–1.24)
GFR, Estimated: 47 mL/min — ABNORMAL LOW (ref >60)
Glucose, Bld: 107 mg/dL — ABNORMAL HIGH (ref 70–99)
Phosphorus: 2 mg/dL — ABNORMAL LOW (ref 2.5–4.6)
Potassium: 3.6 mmol/L (ref 3.5–5.1)
Sodium: 139 mmol/L (ref 135–145)

## 2022-11-29 LAB — GLUCOSE, CAPILLARY
Glucose-Capillary: 123 mg/dL — ABNORMAL HIGH (ref 70–99)
Glucose-Capillary: 162 mg/dL — ABNORMAL HIGH (ref 70–99)
Glucose-Capillary: 115 mg/dL — ABNORMAL HIGH (ref 70–99)
Glucose-Capillary: 106 mg/dL — ABNORMAL HIGH (ref 70–99)

## 2022-11-29 LAB — FERRITIN: Ferritin: 149 ng/mL (ref 24–336)

## 2022-11-29 LAB — MAGNESIUM: Magnesium: 2 mg/dL (ref 1.7–2.4)

## 2022-11-29 LAB — URIC ACID: Uric Acid, Serum: 3.8 mg/dL (ref 3.7–8.6)

## 2022-11-29 LAB — PSA: Prostatic Specific Antigen: <0.01 ng/mL (ref 0.00–4.00)

## 2022-11-29 LAB — VITAMIN B12: Vitamin B-12: 663 pg/mL (ref 180–914)

## 2022-11-29 MED ORDER — HYDROCODONE-ACETAMINOPHEN 5-325 MG PO TABS
1.0000 | ORAL_TABLET | Freq: Four times a day (QID) | ORAL | Status: DC | PRN
  Administered 2022-12-01 – 2022-12-08 (×7): 1 via ORAL
  Filled 2022-11-29 (×7): qty 1

## 2022-11-29 NOTE — Progress Notes (Signed)
[facility] Progress Note Patient: Wayne [name] UVO:[id number] DOB: 1941/04/02 DOA: 11/16/2022  DOS: the patient was seen and examined on 11/29/2022  Brief hospital course: 81 y.o. PMH of CAD SP PCI, PAF on Eliquis, HTN, chronic combined CHF, OSA, neuropathy, type II DM, PMR, prostate cancer SP brachytherapy, bladder cancer, AICD implant. Presented to hospital with complaints of hematuria and catheter dysfunction. Urology following.  Developed hospital induced delirium. Assessment and Plan: Gross hematuria. bladder cancer and prostate cancer. 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 Obstructive uropathy. Seen in ED by urology. Underwent cystoscopy on 12/3 and 12/12. Currently urine appears to be clear. Treated with CBI. Although patient retaining again after removal of the Foley catheter on 12/15 and therefore Foley catheter will be inserted in the on 12/16. Patient will discharge home with Foley catheter now.  Candida UTI. Currently on fluconazole.  For a total of 14 days. Anticoagulation had to change to Lovenox for that.  History of bladder cancer. Biopsy positive for metastatic small cell prostate cancer. Discussed with [name].  Will monitor recommendation. Otherwise management per urology.  lymphoma/SLL. Biopsy positive for possible lymphoma/SLL. Hematology consulted with [name]. Will monitor recommendation  Left thumb paronychia On IV Zosyn and doxycycline.  Will switch to oral Augmentin. Follow-up on cultures. Highly appreciate hand surgery consultation.  Underwent bedside debridement.  Hospital induced delirium. As needed Haldol and nightly Zyprexa.  Possible aspiration pneumonia. I am not convinced that the patient actually has a pneumonia.  Patient was started on antibiotics which we will continue given paronychia.  AKI on CKD 3A. Carcinoma of the prostate with metastasis.  Function elevated. Monitor.  CAD. Stable. On statin.   Anticoagulation currently on hold.  Chronic combined CHF. Hypertension. Blood pressure soft. Currently all antihypertensive regimen is currently on hold. Appears to be euvolemic for now. Monitor.  Type II DM with hyperglycemia with long-term insulin use. Currently continuing basal bolus regimen. Hemoglobin A1c 11.2. Monitor.  Obesity Body mass index is 33.19 kg/m.  Placing the pt at higher risk of poor outcomes.  Subjective: No nausea no vomiting.  No fever no chills.  No chest pain.  Wife reported some confusion.  Patient reported some abdominal pain at the suprapubic region.  Retaining 400 mL after urination but her bladder scan.  Physical Exam: General: Appear in mild distress; Cardiovascular: S1 and S2 Present, no Murmur, Respiratory: good respiratory effort, Bilateral Air entry present, CTA, no Crackles, no wheezes Abdomen: Bowel Sound present, suprapubic tenderness Extremities: no Pedal edema Neurology: alert and oriented to time, place, and person, no focal deficit, no asterixis  Data Reviewed: I have Reviewed nursing notes, Vitals, and Lab results. Since last encounter, pertinent lab results CBC and BMP   . I have ordered test including CBC and BMP  .      Disposition: Status is: Inpatient Remains inpatient appropriate because: Monitor stability of mentation  Place and maintain sequential compression device Start: 11/24/22 1724 SCDs Start: 11/16/22 1423   Family Communication: Discussed with wife at bedside Level of care: Progressive continue progressive care for now as high risk for bleeding. Vitals:   11/29/22 0658 11/29/22 1033 11/29/22 1303 11/29/22 1449  BP: 108/71 97/62 116/62 123/65  Pulse: 77 78 78 88  Resp: '18 18 14 18  '$ Temp: 98.5 F (36.9 C) 98.3 F (36.8 C) 97.7 F (36.5 C)   TempSrc: Oral Oral Oral   SpO2: 94% 97% 95% 95%  Weight:      Height:  Author: Berle Mull, MD 11/29/2022 5:42 PM  Please look on [email or web address] to find out  who is on call.

## 2022-11-29 NOTE — Progress Notes (Signed)
Mobility Specialist - Progress Note   11/29/22 1441  Mobility  Activity Ambulated with assistance in hallway  Level of Assistance Moderate assist, patient does 50-74%  Assistive Device Front wheel walker  Distance Ambulated (ft) 70 ft  Range of Motion/Exercises Active  Activity Response Tolerated well  Mobility Referral Yes  $Mobility charge 1 Mobility   Pt was found in bed and agreeable to ambulate. Pt was mod-A from lying>sitting and min-A from sitting to standing. During ambulation was contact guard and had no complaints. Stated that it felt good to be up and walking. At EOS returned to bed with necessities in reach and wife in room.  Ferd Hibbs Mobility Specialist

## 2022-11-29 NOTE — Progress Notes (Signed)
Mobility Specialist - Progress Note   11/29/22 1015  Mobility  Activity Ambulated with assistance in hallway  Level of Assistance Moderate assist, patient does 50-74%  Assistive Device Front wheel walker  Distance Ambulated (ft) 70 ft  Range of Motion/Exercises Active  Activity Response Tolerated well  Mobility Referral Yes  $Mobility charge 1 Mobility   Pt was found in bed and agreeable to ambulate. Pt was mod-A going from lying>sitting and min-A going from sitting>standing. During ambulation was contact guard and had x1 LOB when turning, which was corrected with assistance. At EOS was left in bed with necessities in reach.  Ferd Hibbs Mobility Specialist

## 2022-11-29 NOTE — Progress Notes (Signed)
S: Pt without complaints. No problems passing urine.   O: Vitals:   11/29/22 0658 11/29/22 1033  BP: 108/71 97/62  Pulse: 77 78  Resp: 18 18  Temp: 98.5 F (36.9 C) 98.3 F (36.8 C)  SpO2: 94% 97%   NAD Alert Urine dark maroon in suction canister   CBC    Component Value Date/Time   WBC 8.8 11/29/2022 0624   RBC 3.44 (L) 11/29/2022 0624   RBC 3.39 (L) 11/29/2022 0624   HGB 9.6 (L) 11/29/2022 0624   HCT 30.7 (L) 11/29/2022 0624   PLT 256 11/29/2022 0624   MCV 89.2 11/29/2022 0624   MCH 27.9 11/29/2022 0624   MCHC 31.3 11/29/2022 0624   RDW 13.6 11/29/2022 0624   LYMPHSABS 2.6 11/26/2022 0447   MONOABS 0.4 11/26/2022 0447   EOSABS 0.0 11/26/2022 0447   BASOSABS 0.0 11/26/2022 0447   Lab Results  Component Value Date   CREATININE 1.51 (H) 11/29/2022   CREATININE 1.67 (H) 11/28/2022   CREATININE 1.98 (H) 11/27/2022    A/P:  -small cell adenocarcinoma of prostate - voiding without foley. S/p bilateral stent placement. Hgb looks good. Cr improved. Deciding between comfort care and chemotherapy.

## 2022-11-29 NOTE — Progress Notes (Signed)
The patient reports feeling as if bladder is not emptying, Bladder scan 410 cc, Hospitalist notified V.O received to insert 16 F Foley catheter.

## 2022-11-30 DIAGNOSIS — N139 Obstructive and reflux uropathy, unspecified: Secondary | ICD-10-CM | POA: Diagnosis not present

## 2022-11-30 LAB — CBC WITH DIFFERENTIAL/PLATELET
Abs Immature Granulocytes: 0.12 10*3/uL — ABNORMAL HIGH (ref 0.00–0.07)
Basophils Absolute: 0.1 10*3/uL (ref 0.0–0.1)
Basophils Relative: 1 %
Eosinophils Absolute: 0.3 10*3/uL (ref 0.0–0.5)
Eosinophils Relative: 3 %
HCT: 29.5 % — ABNORMAL LOW (ref 39.0–52.0)
Hemoglobin: 9.1 g/dL — ABNORMAL LOW (ref 13.0–17.0)
Immature Granulocytes: 1 %
Lymphocytes Relative: 28 %
Lymphs Abs: 2.7 10*3/uL (ref 0.7–4.0)
MCH: 28.1 pg (ref 26.0–34.0)
MCHC: 30.8 g/dL (ref 30.0–36.0)
MCV: 91 fL (ref 80.0–100.0)
Monocytes Absolute: 0.6 10*3/uL (ref 0.1–1.0)
Monocytes Relative: 6 %
Neutro Abs: 5.9 10*3/uL (ref 1.7–7.7)
Neutrophils Relative %: 61 %
Platelets: 239 10*3/uL (ref 150–400)
RBC: 3.24 MIL/uL — ABNORMAL LOW (ref 4.22–5.81)
RDW: 13.7 % (ref 11.5–15.5)
WBC: 9.6 10*3/uL (ref 4.0–10.5)
nRBC: 0 % (ref 0.0–0.2)

## 2022-11-30 LAB — COMPREHENSIVE METABOLIC PANEL
ALT: 18 U/L (ref 0–44)
AST: 24 U/L (ref 15–41)
Albumin: 2.3 g/dL — ABNORMAL LOW (ref 3.5–5.0)
Alkaline Phosphatase: 54 U/L (ref 38–126)
Anion gap: 7 (ref 5–15)
BUN: 21 mg/dL (ref 8–23)
CO2: 24 mmol/L (ref 22–32)
Calcium: 8.9 mg/dL (ref 8.9–10.3)
Chloride: 109 mmol/L (ref 98–111)
Creatinine, Ser: 1.63 mg/dL — ABNORMAL HIGH (ref 0.61–1.24)
GFR, Estimated: 43 mL/min — ABNORMAL LOW (ref >60)
Glucose, Bld: 107 mg/dL — ABNORMAL HIGH (ref 70–99)
Potassium: 4.2 mmol/L (ref 3.5–5.1)
Sodium: 140 mmol/L (ref 135–145)
Total Bilirubin: 0.4 mg/dL (ref 0.3–1.2)
Total Protein: 5.4 g/dL — ABNORMAL LOW (ref 6.5–8.1)

## 2022-11-30 LAB — GLUCOSE, CAPILLARY
Glucose-Capillary: 86 mg/dL (ref 70–99)
Glucose-Capillary: 114 mg/dL — ABNORMAL HIGH (ref 70–99)
Glucose-Capillary: 121 mg/dL — ABNORMAL HIGH (ref 70–99)
Glucose-Capillary: 184 mg/dL — ABNORMAL HIGH (ref 70–99)

## 2022-11-30 LAB — MAGNESIUM: Magnesium: 1.9 mg/dL (ref 1.7–2.4)

## 2022-11-30 LAB — TESTOSTERONE: Testosterone: 68 ng/dL — ABNORMAL LOW (ref 264–916)

## 2022-11-30 MED ORDER — B COMPLEX-C PO TABS
1.0000 | ORAL_TABLET | Freq: Every day | ORAL | Status: DC
  Administered 2022-11-30 – 2022-12-04 (×5): 1 via ORAL
  Filled 2022-11-30 (×5): qty 1

## 2022-11-30 MED ORDER — CARVEDILOL 3.125 MG PO TABS
3.1250 mg | ORAL_TABLET | Freq: Two times a day (BID) | ORAL | Status: DC
  Administered 2022-12-01 – 2022-12-04 (×6): 3.125 mg via ORAL
  Filled 2022-11-30 (×12): qty 1

## 2022-11-30 MED ORDER — FERROUS SULFATE 325 (65 FE) MG PO TABS
325.0000 mg | ORAL_TABLET | Freq: Every day | ORAL | Status: DC
  Administered 2022-11-30 – 2022-12-04 (×5): 325 mg via ORAL
  Filled 2022-11-30 (×5): qty 1

## 2022-11-30 NOTE — Progress Notes (Signed)
[facility] Progress Note Patient: Wayne [name] CXK:[id number] DOB: Nov 03, 1941 DOA: 11/16/2022  DOS: the patient was seen and examined on 11/30/2022  Brief hospital course: 81 y.o. PMH of CAD SP PCI, PAF on Eliquis, HTN, chronic combined CHF, OSA, neuropathy, type II DM, PMR, prostate cancer SP brachytherapy, bladder cancer, AICD implant. Presented to hospital with complaints of hematuria and catheter dysfunction. Urology following.  Developed hospital induced delirium. Assessment and Plan: Gross hematuria. bladder cancer and prostate cancer. 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 Obstructive uropathy. Seen in ED by urology. Underwent cystoscopy on 12/3 and 12/12. Currently urine appears to be clear. Treated with CBI. Although patient retaining again after removal of the Foley catheter on 12/15 and therefore Foley catheter will be inserted in the on 12/16. Patient will discharge home with Foley catheter now. Patient appears to have pink urine again.  Which per urology is not unusual.  For now we will hold Lovenox as it is prophylactic-preventing future strokes-while he continues to have minor bleeding in present.   Candida UTI. Candida in left thumb cultures Currently on fluconazole.  For a total of 14 days. Anticoagulation had to change to Lovenox for that. Patient's cultures from the left thumb also growing Candida.  There is a concern that the patient may actually have a candidemia. Will perform blood cultures. Check echocardiogram. Discussed with ID on 12/17 on phone.   History of bladder cancer. Biopsy positive for metastatic small cell prostate cancer. Discussed with [name].  Will monitor recommendation. Otherwise management per urology.   lymphoma/SLL. Biopsy positive for possible lymphoma/SLL. Hematology consulted with [name]. Will monitor recommendation   Left thumb paronychia On IV Zosyn and doxycycline.  Will switch to oral  Augmentin. Follow-up on cultures. Highly appreciate hand surgery consultation.  Underwent bedside debridement.   Hospital induced delirium. As needed Haldol and nightly Zyprexa.   Possible aspiration pneumonia. I am not convinced that the patient actually has a pneumonia.  Patient was started on antibiotics which we will continue given paronychia.   AKI on CKD 3A. Carcinoma of the prostate with metastasis.  Function elevated. Monitor.   CAD. Stable. On statin.  Anticoagulation currently on hold.   Chronic combined CHF. Hypertension. Blood pressure soft. Currently all antihypertensive regimen is currently on hold. Appears to be euvolemic for now. Monitor.  History of V. tach storm. Frequent PVCs. NSVT. Patient does have history of significant cardiac arrhythmia and has an ICD. Patient was on amiodarone in the past which was discontinued for hyperthyroidism. Followed by this patient was on mexiletine which appears to be mistakenly patient has stopped taking since July 2023. Due to soft blood pressure unable to initiate patient's home Coreg. Will monitor.   Type II DM with hyperglycemia with long-term insulin use. Currently continuing basal bolus regimen. Hemoglobin A1c 11.2. Monitor.   Obesity Body mass index is 33.19 kg/m.  Placing the pt at higher risk of poor outcomes.  Goals of care conversation. Extensive discussion with the patient with regards to his goals of care in presence of his son. Patient is alert awake and oriented. Patient wants to have a natural course of death and wants to be DNR/DNI regardless of the outcome and understands it very well. Patient does have multiple comorbidities including chronic systolic CHF with a EF of 25% requiring an AICD with history of V. tach storm and CAD as well as CKD 3A. This is on top of patient's recent diagnosis of highly aggressive small cell prostate  metastatic cancer. Consult Perative care.  Monitor further  discussion.   Subjective: No nausea no vomiting.  No fever no chills.  Pain well-controlled.  Physical Exam: General: in mild distress;  Cardiovascular: S1 and S2 Present, no Murmur Respiratory: Normal respiratory effort, Bilateral Air entry present, no Crackles, no wheezes Abdomen: Bowel Sound present, Non tender  Extremities: No edema Neurology: alert and oriented to time, place, and person   Data Reviewed: I have Reviewed nursing notes, Vitals, and Lab results. Since last encounter, pertinent lab results CBC and BMP   . I have ordered test including CBC and BMP  .  Discussed with ID on the phone. Disposition: Status is: Inpatient Remains inpatient appropriate because: Need for further workup for infection with Candida  Place and maintain sequential compression device Start: 11/24/22 1724 SCDs Start: 11/16/22 1423   Family Communication: Son at bedside Level of care: Progressive Continue progressive care unit for now. Vitals:   11/29/22 1449 11/29/22 2205 11/30/22 0557 11/30/22 1329  BP: 123/65 95/61 (!) 95/52 (!) 103/56  Pulse: 88 82 80 79  Resp: '18 20 18 16  '$ Temp:  98.4 F (36.9 C) 98.1 F (36.7 C) 98.1 F (36.7 C)  TempSrc:  Oral Oral Oral  SpO2: 95% 91% 98% 90%  Weight:      Height:         Author: Berle Mull, MD 11/30/2022 8:16 PM  Please look on [email or web address] to find out who is on call.

## 2022-11-30 NOTE — Progress Notes (Signed)
Mobility Specialist - Progress Note   11/30/22 1411  Mobility  Activity Ambulated with assistance in hallway;Ambulated with assistance to bathroom  Level of Assistance Contact guard assist, steadying assist  Assistive Device Front wheel walker  Distance Ambulated (ft) 120 ft  Activity Response Tolerated well  Mobility Referral Yes  $Mobility charge 1 Mobility   Pt received in bed and agreeable to mobility. No complaints during mobility. Pt to recliner after session with all needs met.    [facility]

## 2022-11-30 NOTE — TOC Initial Note (Signed)
Transition of Care [facility]) - Initial/Assessment Note    Patient Details  Name: Wayne [name] MRN: [id number] Date of Birth: 25-Jun-1941  Transition of Care [facility]) CM/SW Contact:    Henrietta Dine, RN Phone Number: [phone] 11/30/2022, 5:43 PM  Clinical Narrative:                 [facility] consult for HHPT; spoke w/ pt and son in room; the pt says his POC is wife Somtochukwu Woollard ([phone]); the pt says he wears reading glasses and dentures (upper/lower); he says he has an electric scooter, walker, and cane; the pt says he does not have [facility] services; he denies IPV, food insecurity, and difficulty paying utilities; he has transport home; the pt agrees to [place] and does not have an agency preference; per Tommi Rumps at [facility] the agency can provide services for pt; contact info for [facility] placed in follow up provider section of d/c instructions; attempted to notify pt's wife per his request; LVM w/ call back # [phone]; TOC will follow.        Patient Goals and CMS Choice        Expected Discharge Plan and Services                                                Prior Living Arrangements/Services                       Activities of Daily Living Home Assistive Devices/Equipment: Gilford Rile (specify type) ADL Screening (condition at time of admission) Patient's cognitive ability adequate to safely complete daily activities?: Yes Is the patient deaf or have difficulty hearing?: No Does the patient have difficulty seeing, even when wearing glasses/contacts?: No Does the patient have difficulty concentrating, remembering, or making decisions?: No Patient able to express need for assistance with ADLs?: Yes Does the patient have difficulty dressing or bathing?: No Independently performs ADLs?: Yes (appropriate for developmental age) Does the patient have difficulty walking or climbing stairs?: Yes Weakness of Legs: Both Weakness of Arms/Hands: None  Permission  Sought/Granted                  Emotional Assessment              Admission diagnosis:  Obstructive uropathy [N13.9] Problem with Foley catheter, initial encounter ([place]) [X51.9XXA] Patient Active Problem List   Diagnosis Date Noted   Paronychia of right thumb 11/28/2022   NSVT (nonsustained ventricular tachycardia) ([place]) 11/25/2022   HCAP (healthcare-associated pneumonia) 11/25/2022   Acute blood loss anemia 11/24/2022   Hypotension 11/23/2022   Candiduria 11/21/2022   UTI (urinary tract infection) Probable 11/20/2022   Obstructive uropathy 11/16/2022   Type 2 diabetes mellitus with hyperglycemia ([place]) 11/16/2022   ICD (implantable cardioverter-defibrillator) in place 11/30/2019   Ischemic cardiomyopathy 11/30/2019   Bladder cancer ([place]) 11/09/2017   Low back pain radiating to left lower extremity 09/21/2017   Foot drop, left 08/24/2017   Diabetes mellitus ([place])    ICD (implantable cardioverter-defibrillator) discharge 07/05/2017   Syncope and collapse    Ventricular tachycardia ([place]) 06/18/2017   Paroxysmal atrial fibrillation ([place]) 06/10/2017   Hypoxemia    VT (ventricular tachycardia) ([place])    Cardiac arrest ([place]) 05/22/2017   Defibrillator discharge 02/11/2017   Atrial fibrillation with rapid ventricular response ([place]) 01/16/2017   Coronary stent occlusion  11/18/2016   Type 2 diabetes, uncontrolled, with neuropathy 09/30/2016   CKD (chronic kidney disease) stage 3, GFR 30-59 ml/min (HCC) 09/30/2016   CAD (coronary artery disease) 11/20/2015   Leucocytoclastic vasculitis ([place]) 06/25/2012   Staphylococcus aureus bacteremia with sepsis ([place]) 06/18/2012   NSTEMI, initial episode of care ([place]) 06/16/2012   Cellulitis and abscess of lower leg 06/14/2012    Class: Acute   Healthcare-associated pneumonia 06/14/2012    Class: Acute   Hyperglycemia 06/14/2012    Class: Acute   Leg swelling 04/22/2012   Urethral stricture 04/15/2012   Gross hematuria 03/22/2012    Hematochezia 03/22/2012   Obesity 01/08/2012   ED (erectile dysfunction) of organic origin 09/11/2011   OSA (obstructive sleep apnea) 48/88/9169   Systolic CHF, chronic ([place]) 06/10/2011   HEMATOCHEZIA 02/10/2011   BURSITIS, LEFT HIP 02/10/2011   CORONARY ATHEROSCLEROSIS NATIVE CORONARY ARTERY 01/27/2011   CHEST PAIN UNSPECIFIED 01/20/2011   DYSPNEA 01/16/2011   ABDOMINAL PAIN, UNSPECIFIED SITE 01/13/2011   ECZEMA 01/28/2010   BRONCHITIS, ACUTE WITH MILD BRONCHOSPASM 12/21/2009   HERPES ZOSTER OPHTHALMICUS 07/19/2009   ADENOCARCINOMA, PROSTATE 06/20/2009   Secondary DM with peripheral vascular disease, uncontrolled 06/20/2009   Hyperlipidemia 06/20/2009   Essential hypertension 06/20/2009   HYPERTROPHY PROSTATE W/UR OBST & OTH LUTS 06/20/2009   PCP:  [name], MD Pharmacy:   [id number]: [phone]: [phone]    Social Determinants of Health (SDOH) Interventions    Readmission Risk Interventions     No data to display

## 2022-12-01 ENCOUNTER — Inpatient Hospital Stay (HOSPITAL_COMMUNITY): Payer: Medicare Other

## 2022-12-01 ENCOUNTER — Telehealth: Payer: Self-pay | Admitting: *Deleted

## 2022-12-01 ENCOUNTER — Telehealth (HOSPITAL_COMMUNITY): Payer: Self-pay | Admitting: *Deleted

## 2022-12-01 DIAGNOSIS — R52 Pain, unspecified: Secondary | ICD-10-CM

## 2022-12-01 DIAGNOSIS — M7989 Other specified soft tissue disorders: Secondary | ICD-10-CM

## 2022-12-01 DIAGNOSIS — N139 Obstructive and reflux uropathy, unspecified: Secondary | ICD-10-CM | POA: Diagnosis not present

## 2022-12-01 LAB — CBC WITH DIFFERENTIAL/PLATELET
Abs Immature Granulocytes: 0.08 10*3/uL — ABNORMAL HIGH (ref 0.00–0.07)
Basophils Absolute: 0.1 10*3/uL (ref 0.0–0.1)
Basophils Relative: 1 %
Eosinophils Absolute: 0.3 10*3/uL (ref 0.0–0.5)
Eosinophils Relative: 3 %
HCT: 29.2 % — ABNORMAL LOW (ref 39.0–52.0)
Hemoglobin: 9.1 g/dL — ABNORMAL LOW (ref 13.0–17.0)
Immature Granulocytes: 1 %
Lymphocytes Relative: 27 %
Lymphs Abs: 2.5 10*3/uL (ref 0.7–4.0)
MCH: 28.2 pg (ref 26.0–34.0)
MCHC: 31.2 g/dL (ref 30.0–36.0)
MCV: 90.4 fL (ref 80.0–100.0)
Monocytes Absolute: 0.7 10*3/uL (ref 0.1–1.0)
Monocytes Relative: 7 %
Neutro Abs: 5.7 10*3/uL (ref 1.7–7.7)
Neutrophils Relative %: 61 %
Platelets: 235 10*3/uL (ref 150–400)
RBC: 3.23 MIL/uL — ABNORMAL LOW (ref 4.22–5.81)
RDW: 13.8 % (ref 11.5–15.5)
WBC: 9.3 10*3/uL (ref 4.0–10.5)
nRBC: 0 % (ref 0.0–0.2)

## 2022-12-01 LAB — SURGICAL PATHOLOGY

## 2022-12-01 LAB — BASIC METABOLIC PANEL
Anion gap: 5 (ref 5–15)
BUN: 20 mg/dL (ref 8–23)
CO2: 25 mmol/L (ref 22–32)
Calcium: 8.8 mg/dL — ABNORMAL LOW (ref 8.9–10.3)
Chloride: 110 mmol/L (ref 98–111)
Creatinine, Ser: 1.52 mg/dL — ABNORMAL HIGH (ref 0.61–1.24)
GFR, Estimated: 46 mL/min — ABNORMAL LOW (ref >60)
Glucose, Bld: 94 mg/dL (ref 70–99)
Potassium: 3.8 mmol/L (ref 3.5–5.1)
Sodium: 140 mmol/L (ref 135–145)

## 2022-12-01 LAB — GLUCOSE, CAPILLARY
Glucose-Capillary: 131 mg/dL — ABNORMAL HIGH (ref 70–99)
Glucose-Capillary: 111 mg/dL — ABNORMAL HIGH (ref 70–99)
Glucose-Capillary: 120 mg/dL — ABNORMAL HIGH (ref 70–99)
Glucose-Capillary: 91 mg/dL (ref 70–99)

## 2022-12-01 MED ORDER — MEXILETINE HCL 150 MG PO CAPS
150.0000 mg | ORAL_CAPSULE | Freq: Two times a day (BID) | ORAL | Status: DC
  Administered 2022-12-01 – 2022-12-08 (×15): 150 mg via ORAL
  Filled 2022-12-01 (×15): qty 1

## 2022-12-01 MED ORDER — ACETAMINOPHEN 500 MG PO TABS
1000.0000 mg | ORAL_TABLET | Freq: Once | ORAL | Status: AC
  Administered 2022-12-01: 1000 mg via ORAL
  Filled 2022-12-01: qty 2

## 2022-12-01 NOTE — Telephone Encounter (Signed)
Received vm message from pt's daughter. She states that [name] has seen her father in the hospital-he is currently in room 1414 @ [facility]. She would like to schedule a time from herself and her mom to talk with [name] about his medical situation. Called back and advised that [name] can make arrangements to see pt  and family tomorrow, Tuesday 12/02/22  TCT daughter, Victoriano Lain of the above . She is good with as visit tomorrow.

## 2022-12-01 NOTE — Progress Notes (Signed)
Ortho Hand Progress Note  S: Patient doing well at this time.  Afebrile, normal white count.  PT evaluated him but did not perform hydrotherapy per note.  Per patient, dressing has not been changed since PT visit.  He reports minimal pain or issue with the thumb.  Cultures positive for staph epi and rare candida albicans.  The staph epi is resistant to clinda and bactrim.  Currently on augmentin and doxy as well as diflucan per primary team.    O: dressings removed and right thumb wound examined.  Minimally tender to palpation.  No drainage from wounds.  Able to gently flex and extend at thumb IP joint.  Sensation intact, motor intact.  Warm and well perfused fingers.  A/P:  2M s/p R thumb paronychia bedside I&D on 12/14 -Please perform daily dressing changes to the right thumb wound. -abx per primary team, please keep on for at least 10 days after d/c -Follow up with me as an outpatient in 7 days. -Does not need to perform home TID dial soap soaks as long as no drainage or fevers etc.   Wadie Lessen, MD Hand & Upper Extremity Surgery [facility] [place]

## 2022-12-01 NOTE — Progress Notes (Signed)
Lower extremity venous bilateral study completed.   Please see CV Proc for preliminary results.   Petros Ahart, RDMS, RVT  

## 2022-12-01 NOTE — Telephone Encounter (Signed)
It looks like he has had a complicated hospitalization with hematuria and bladder outlet obstruction and they have found metastatic small cell prostate cancer.  BP has been low and he has had AKI due to obstruction that is improving.  They should slowly and gradually start him back on his cardiac medication regimen.  Would recommend cardiology consultation, would be one of my partners at [facility].

## 2022-12-01 NOTE — Progress Notes (Signed)
Physical Therapy Treatment Patient Details Name: Wayne [name] MRN: [id number] DOB: [birth year] Today's Date: 12/01/2022   History of Present Illness Patient is 81 y.o. male who presented to hospital with complaints of hematuria and catheter dysfunction. Pt underwent cystoscopy on 23/5 with complications of recurrent clots in catheter, recurrent gross hematuria and increasing pelvic lymphadenopathy. Pt underwent TURP, TURBT, bilateral retrograde pyelogram, bilateral ureteral stent placement on 12/12. Biopsies revealed SLL/CLL in Lt axillary nodes, SCC in Lt iliac/pelvic nodes, and SCC of prostate and bladder. During stay pt also developed infection of Rt thumb and underwent bedside debridement by orthopedics on 12/14. PMH significant for CAD SP PCI, PAF on Eliquis, HTN, chronic combined CHF, OSA, neuropathy, type II DM, PMR, prostate cancer SP brachytherapy, bladder cancer, AICD implant.    PT Comments    Pt requesting to use bathroom prior to ambulating in hallway.  Pt required light assist to rise and steady from slightly elevated bed.  Pt able to ambulate into bathroom.  Upon attempting to rise from toilet, pt unable with use of grab bars so requested nurse tech in to assist.  Pt again unable to rise with 2 person assist.  Pt required total assist +3 from toilet surface however legs buckling and pt unable to maintain hip and knee extension.  Pt required transport chair and total assist with lateral transfer to get over to transport chair; also max assist to get from transport chair with lateral transfer over to bed.  RN also present for transfers and aware of mobility level today.  Updated d/c recommendations for SNF.   Recommendations for follow up therapy are one component of a multi-disciplinary discharge planning process, led by the attending physician.  Recommendations may be updated based on patient status, additional functional criteria and insurance authorization.  Follow Up Recommendations   Skilled nursing-short term rehab (<3 hours/day) Can patient physically be transported by private vehicle: No   Assistance Recommended at Discharge Frequent or constant Supervision/Assistance  Patient can return home with the following Assistance with cooking/housework;Direct supervision/assist for medications management;Assist for transportation;Help with stairs or ramp for entrance;A lot of help with walking and/or transfers;A lot of help with bathing/dressing/bathroom   Equipment Recommendations  None recommended by PT    Recommendations for Other Services       Precautions / Restrictions Precautions Precautions: Fall     Mobility  Bed Mobility Overal bed mobility: Needs Assistance Bed Mobility: Supine to Sit     Supine to sit: Min guard, HOB elevated          Transfers Overall transfer level: Needs assistance Equipment used: Rolling walker (2 wheels) Transfers: Sit to/from Stand Sit to Stand: Min assist, +2 physical assistance, Total assist           General transfer comment: light assist to rise and steady from slightly elevated bed, total assist +3 from toilet surface; pt was unable to stand from toilet with grab bars and RW, unable with Stedy (unable to clear hips and extend); and able to stand briefly with just +3 assist; required transport and total assist with lateral transfer to get over to transport chair; also max assist to get from transport chair with lateral transfer over to bed    Ambulation/Gait Ambulation/Gait assistance: Min guard Gait Distance (Feet): 8 Feet Assistive device: Rolling walker (2 wheels) Gait Pattern/deviations: Step-through pattern, Decreased stride length, Wide base of support, Trunk flexed       General Gait Details: cues for RW positioning, pt denies pain,  ambulated into bathroom (unable to rise from toilet or ambulate back to bed)   Stairs             Wheelchair Mobility    Modified Rankin (Stroke Patients Only)        Balance                                            Cognition Arousal/Alertness: Awake/alert Behavior During Therapy: WFL for tasks assessed/performed Overall Cognitive Status: Impaired/Different from baseline                                 General Comments: difficulty performing cues/commands despite discussing before movement        Exercises      General Comments        Pertinent Vitals/Pain      Home Living                          Prior Function            PT Goals (current goals can now be found in the care plan section) Progress towards PT goals: Progressing toward goals    Frequency    Min 2X/week      PT Plan Discharge plan needs to be updated    Co-evaluation              AM-PAC PT "6 Clicks" Mobility   Outcome Measure  Help needed turning from your back to your side while in a flat bed without using bedrails?: A Lot Help needed moving from lying on your back to sitting on the side of a flat bed without using bedrails?: A Lot Help needed moving to and from a bed to a chair (including a wheelchair)?: A Lot Help needed standing up from a chair using your arms (e.g., wheelchair or bedside chair)?: Total Help needed to walk in hospital room?: Total Help needed climbing 3-5 steps with a railing? : Total 6 Click Score: 9    End of Session Equipment Utilized During Treatment: Gait belt Activity Tolerance: Patient tolerated treatment well Patient left: in bed;with call bell/phone within reach;with bed alarm set;with family/visitor present   PT Visit Diagnosis: Difficulty in walking, not elsewhere classified (R26.2);Muscle weakness (generalized) (M62.81)     Time: 1610-9604 PT Time Calculation (min) (ACUTE ONLY): 33 min  Charges:  $Therapeutic Activity: 23-37 mins                     Jannette Spanner PT, DPT Physical Therapist Acute Rehabilitation Services Preferred contact method: Secure  Chat Weekend Pager Only: [phone] Office: [place] 12/01/2022, 3:30 PM

## 2022-12-01 NOTE — Progress Notes (Signed)
[facility] Progress Note Patient: Wayne [name] DPO:[id number] DOB: 07-Mar-1941 DOA: 11/16/2022  DOS: the patient was seen and examined on 12/01/2022  Brief hospital course: 81 y.o. PMH of CAD SP PCI, PAF on Eliquis, HTN, chronic combined CHF, OSA, neuropathy, type II DM, PMR, prostate cancer SP brachytherapy, bladder cancer, AICD implant. Presented to hospital with complaints of hematuria and catheter dysfunction. Urology following.  Developed hospital induced delirium. 12/3 cystoscopy with clot evacuation and fulguration.  Initiated on CBI.  Started on Cipro. 12/5 CT-guided biopsy of left iliac and axillary lymph nodes. 12/8 started on fluconazole for yeast in urine 12/12 TURP, TURBT, bilateral pyelogram and ureteral stent placement.  Started on IV antibiotic for pneumonia 12/14 found to have left thumb paronychia doxycycline was added.  Underwent bedside debridement with hand surgery 12/15 biopsies came back positive for small cell metastatic prostate carcinoma.  Medical oncology was consulted.  Foley catheter was removed. 12/16 Foley catheter reinserted due to retention. 12/17 patient decided to transition to DNR/DNI from full code had frequent PVCs on telemetry.  Wound cultures came back positive from the thumb for Candida as well.  Workup for candidemia initiated.  Discussed with ID.  Recommend to continue oral fluconazole. 12/18 discussed with [name] from cardiology due to NSVT, initiated on mexiletine.  Palliative care consulted  Assessment and Plan: Gross hematuria. bladder cancer and prostate cancer. 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 Obstructive uropathy. Seen in ED by urology. Underwent cystoscopy on 12/3 and 12/12. Currently urine appears to be clear. Treated with CBI. Although patient retaining again after removal of the Foley catheter on 12/15 and therefore Foley catheter will be inserted in the on 12/16. Patient will discharge home with  Foley catheter now. Patient appears to have pink urine again.  Which per urology is not unusual.  For now we will hold Lovenox as it is prophylactic-preventing future strokes-while he continues to have minor bleeding in present.   Candida UTI. Candida in left thumb cultures Concern for candidemia. Currently on fluconazole.  Initial plan was for 14-day therapy.  Will continue for longer duration for now. Anticoagulation had to change to Lovenox for that. Patient's cultures from the left thumb also growing Candida.  There is a concern that the patient may actually have a candidemia. Blood cultures performed.  Echocardiogram ordered.  Discussed with ID on 12/17 on phone.   History of bladder cancer. Biopsy positive for metastatic small cell prostate cancer. Discussed with [name].  Will monitor recommendation. Otherwise management per urology.   lymphoma/SLL. Axillary lymph node biopsy positive for possible lymphoma/SLL. Hematology consulted with [name]. Will monitor recommendation   Left thumb paronychia On IV Zosyn and doxycycline.  Will switch to oral Augmentin. Follow-up on cultures. Highly appreciate hand surgery consultation.  Underwent bedside debridement. Continue daily dressing changes.  Hospital induced delirium. As needed Haldol and nightly Zyprexa.   Possible aspiration pneumonia. I am not convinced that the patient actually has a pneumonia.  Patient was started on antibiotics which we will continue given paronychia.   AKI on CKD 3A. Carcinoma of the prostate with metastasis.  Function elevated. Monitor.   CAD. Stable. On statin.  Anticoagulation currently on hold.   Chronic combined CHF. Hypertension. Blood pressure soft. Currently all antihypertensive regimen is currently on hold. Mild lower extremity edema seen.  Dopplers negative for DVT.  No room to initiate aggressive diuretic therapy or GDMT.  Monitor.  History of V. tach storm. Frequent  PVCs. NSVT. Patient  does have history of significant cardiac arrhythmia and has an ICD. Patient was on amiodarone in the past which was discontinued for hyperthyroidism. Followed by this patient was on mexiletine which appears to be mistakenly patient has stopped taking since July 2023. Due to soft blood pressure unable to initiate patient's home Coreg. Discussed with cardiology [name] on 12/18.  Initiated on mexiletine.  If the patient has significant NSVT burden still, they may consider initiation of amiodarone and performing a formal consult at that point.   Type II DM with hyperglycemia with long-term insulin use. Currently continuing basal bolus regimen. Hemoglobin A1c 11.2. Monitor.   Obesity Body mass index is 33.19 kg/m.  Placing the pt at higher risk of poor outcomes.  Goals of care conversation. Extensive discussion with the patient with regards to his goals of care in presence of his son. Patient is alert awake and oriented. Patient wants to have a natural course of death and wants to be DNR/DNI regardless of the outcome and understands it very well. Patient does have multiple comorbidities including chronic systolic CHF with a EF of 25% requiring an AICD with history of V. tach storm and CAD as well as CKD 3A. This is on top of patient's recent diagnosis of highly aggressive small cell prostate metastatic cancer. Consult palliative care.  Monitor further discussion.  Lower extremity edema. Dopplers negative for DVT.   Subjective: Reported pain in his lower extremity on both sides.  No nausea no vomiting.  No fever no chills.  Telemetry monitoring shows frequent NSVT's.  He feels that the heart is fluttering every now and then.  Physical Exam: General: Appear in mild distress; Cardiovascular: S1 and S2 Present, no Murmur, Respiratory: good respiratory effort, Bilateral Air entry present, CTA, no Crackles, no wheezes Abdomen: Bowel Sound present, Non tender   Extremities: bilateral Pedal edema Neurology: alert and oriented to time, place, and person   Data Reviewed: I have Reviewed nursing notes, Vitals, and Lab results. Since last encounter, pertinent lab results CBC and BMP   . I have ordered test including CBC and BMP  . I have discussed pt's care plan and test results with cardiology and urology  .    Disposition: Status is: Inpatient Remains inpatient appropriate because: Need for further workup for infection with Candida  Place and maintain sequential compression device Start: 11/24/22 1724 SCDs Start: 11/16/22 1423   Family Communication: Son at bedside Level of care: Progressive Continue progressive care unit for now. Vitals:   11/30/22 1329 11/30/22 2101 12/01/22 0455 12/01/22 1231  BP: (!) 103/56 (!) 98/48 (!) 99/55 (!) 102/54  Pulse: 79 80 80 73  Resp: '16  16 20  '$ Temp: 98.1 F (36.7 C) 98.1 F (36.7 C) 98 F (36.7 C) 98.1 F (36.7 C)  TempSrc: Oral Oral Oral Oral  SpO2: 90% 95% 94% 92%  Weight:      Height:         Author: Berle Mull, MD 12/01/2022 6:47 PM  Please look on [email or web address] to find out who is on call.

## 2022-12-01 NOTE — Progress Notes (Signed)
S: Pt c/o weakness. It took four people to help him stand up from the commode. Feels better with the foley.   O: Alert and oriented Urine light red   A/P:  -small cell prostate - pt and faimly discussing options want to speak with [name] again. I discussed unfortunately he has a life threatening condition but sometimes difficult to know how long he would have with no treatment.   -gross hematuria - mild. Hgb stable.   -urinary retention - continue foley

## 2022-12-01 NOTE — Telephone Encounter (Signed)
Pts wife stated pt is admitted to [facility] and she would like Dr.McLean to review pts notes and see if he agrees with med changes and plan.

## 2022-12-01 NOTE — Care Management Important Message (Signed)
Important Message  Patient Details  Name: Wayne [name] MRN: [id number] Date of Birth: 10-24-1941   Medicare Important Message Given:  Yes     Memory Argue 12/01/2022, 2:36 PM

## 2022-12-02 ENCOUNTER — Inpatient Hospital Stay (HOSPITAL_COMMUNITY): Payer: Medicare Other

## 2022-12-02 ENCOUNTER — Telehealth (HOSPITAL_COMMUNITY): Payer: Self-pay

## 2022-12-02 DIAGNOSIS — N139 Obstructive and reflux uropathy, unspecified: Secondary | ICD-10-CM | POA: Diagnosis not present

## 2022-12-02 DIAGNOSIS — Z515 Encounter for palliative care: Secondary | ICD-10-CM | POA: Diagnosis not present

## 2022-12-02 DIAGNOSIS — R7881 Bacteremia: Secondary | ICD-10-CM | POA: Diagnosis not present

## 2022-12-02 DIAGNOSIS — C799 Secondary malignant neoplasm of unspecified site: Secondary | ICD-10-CM

## 2022-12-02 DIAGNOSIS — R4589 Other symptoms and signs involving emotional state: Secondary | ICD-10-CM

## 2022-12-02 DIAGNOSIS — L03011 Cellulitis of right finger: Secondary | ICD-10-CM

## 2022-12-02 DIAGNOSIS — D62 Acute posthemorrhagic anemia: Secondary | ICD-10-CM

## 2022-12-02 DIAGNOSIS — Z7189 Other specified counseling: Secondary | ICD-10-CM

## 2022-12-02 DIAGNOSIS — B3749 Other urogenital candidiasis: Secondary | ICD-10-CM | POA: Diagnosis not present

## 2022-12-02 DIAGNOSIS — K5900 Constipation, unspecified: Secondary | ICD-10-CM

## 2022-12-02 DIAGNOSIS — C801 Malignant (primary) neoplasm, unspecified: Secondary | ICD-10-CM

## 2022-12-02 LAB — ECHOCARDIOGRAM COMPLETE
AR max vel: 2.7 cm2
AV Area VTI: 2.64 cm2
AV Area mean vel: 2.64 cm2
AV Mean grad: 10 mmHg
AV Peak grad: 19.4 mmHg
Ao pk vel: 2.2 m/s
Area-P 1/2: 2.83 cm2
Calc EF: 57 %
Height: 68 in
P 1/2 time: 596 msec
S' Lateral: 4.3 cm
Single Plane A2C EF: 55.2 %
Single Plane A4C EF: 58.4 %
Weight: 3492.09 oz

## 2022-12-02 LAB — GLUCOSE, CAPILLARY
Glucose-Capillary: 108 mg/dL — ABNORMAL HIGH (ref 70–99)
Glucose-Capillary: 125 mg/dL — ABNORMAL HIGH (ref 70–99)
Glucose-Capillary: 104 mg/dL — ABNORMAL HIGH (ref 70–99)
Glucose-Capillary: 125 mg/dL — ABNORMAL HIGH (ref 70–99)

## 2022-12-02 MED ORDER — FUROSEMIDE 20 MG PO TABS
20.0000 mg | ORAL_TABLET | Freq: Every day | ORAL | Status: DC
  Administered 2022-12-02 – 2022-12-06 (×5): 20 mg via ORAL
  Filled 2022-12-02 (×6): qty 1

## 2022-12-02 MED ORDER — MELATONIN 5 MG PO TABS
5.0000 mg | ORAL_TABLET | Freq: Once | ORAL | Status: AC
  Administered 2022-12-02: 5 mg via ORAL
  Filled 2022-12-02: qty 1

## 2022-12-02 MED ORDER — DAPAGLIFLOZIN PROPANEDIOL 5 MG PO TABS
5.0000 mg | ORAL_TABLET | Freq: Every day | ORAL | Status: DC
  Administered 2022-12-02: 5 mg via ORAL
  Filled 2022-12-02: qty 1

## 2022-12-02 MED ORDER — POLYETHYLENE GLYCOL 3350 17 G PO PACK
17.0000 g | PACK | Freq: Every day | ORAL | Status: DC
  Administered 2022-12-02 – 2022-12-03 (×2): 17 g via ORAL
  Filled 2022-12-02 (×5): qty 1

## 2022-12-02 MED ORDER — ASPIRIN 81 MG PO TBEC
81.0000 mg | DELAYED_RELEASE_TABLET | Freq: Every day | ORAL | Status: DC
  Administered 2022-12-02 – 2022-12-07 (×6): 81 mg via ORAL
  Filled 2022-12-02 (×6): qty 1

## 2022-12-02 MED ORDER — PERFLUTREN LIPID MICROSPHERE
1.0000 mL | INTRAVENOUS | Status: AC | PRN
  Administered 2022-12-02: 2 mL via INTRAVENOUS

## 2022-12-02 MED ORDER — SENNA 8.6 MG PO TABS
1.0000 | ORAL_TABLET | Freq: Two times a day (BID) | ORAL | Status: DC
  Administered 2022-12-02 – 2022-12-08 (×4): 8.6 mg via ORAL
  Filled 2022-12-02 (×7): qty 1

## 2022-12-02 NOTE — Progress Notes (Signed)
[facility] Progress Note Patient: Wayne [name] NTI:[id number] DOB: 1941/05/28 DOA: 11/16/2022  DOS: the patient was seen and examined on 12/02/2022  Brief hospital course: 81 y.o. PMH of CAD SP PCI, PAF on Eliquis, HTN, chronic combined CHF, OSA, neuropathy, type II DM, PMR, prostate cancer SP brachytherapy, bladder cancer, AICD implant. Presented to hospital with complaints of hematuria and catheter dysfunction. Urology following.  Developed hospital induced delirium. 12/3 cystoscopy with clot evacuation and fulguration.  Initiated on CBI.  Started on Cipro. 12/5 CT-guided biopsy of left iliac and axillary lymph nodes. 12/8 started on fluconazole for yeast in urine 12/12 TURP, TURBT, bilateral pyelogram and ureteral stent placement.  Started on IV antibiotic for pneumonia 12/14 found to have left thumb paronychia doxycycline was added.  Underwent bedside debridement with hand surgery 12/15 biopsies came back positive for small cell metastatic prostate carcinoma.  Medical oncology was consulted.  Foley catheter was removed. 12/16 Foley catheter reinserted due to retention. 12/17 patient decided to transition to DNR/DNI from full code had frequent PVCs on telemetry.  Wound cultures came back positive from the thumb for Candida as well.  Workup for candidemia initiated.  Discussed with ID.  Recommend to continue oral fluconazole. 12/18 discussed with [name] from cardiology due to NSVT, initiated on mexiletine.  Palliative care consulted 12/19 echocardiogram shows evidence of aortic valve thickening.  Will require ID involvement based on goals of care conversation after [name] discussion with the family.  Assessment and Plan: Gross hematuria. bladder cancer and prostate cancer. 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 Obstructive uropathy. Seen in ED by urology. Underwent cystoscopy on 12/3 and 12/12. Currently urine appears to be clear. Treated with  CBI. Patient had retention issues after removal of the Foley catheter on 12/15 and therefore Foley was inserted on 12/16 again. discharge home with Foley catheter now. Anticoagulation is permanently discontinued due to frequent hematuria on anticoagulation.   Candida UTI. Candida in left thumb cultures Aortic valve thickness. Concern for candidemia. Currently on fluconazole.   Initial plan was for 14-day therapy.  Will continue for longer duration for now. Anticoagulation had to change to Lovenox for that.  Now stopped. Patient's cultures from the left thumb also growing Candida.  There is a concern that the patient may actually have a candidemia. Blood cultures performed.  So far no growth. Aortic valve thickness seen on the echocardiogram.  Recommend to discuss with ID based on consultation between palliative care and oncology.   History of bladder cancer. Biopsy positive for metastatic small cell prostate cancer. Discussed with [name].  Will monitor recommendation. Otherwise management per urology.   lymphoma/SLL. Axillary lymph node biopsy positive for possible lymphoma/SLL. Hematology consulted with [name]. Will monitor recommendation   Left thumb paronychia Was on IV Zosyn and doxycycline.  Will switch to oral Augmentin. Follow-up on cultures.  Can narrow down antibiotic to Augmentin only. Highly appreciate hand surgery consultation.  Underwent bedside debridement. Continue daily dressing changes.  Hospital induced delirium. Resolved.  Continue as needed Haldol   Possible aspiration pneumonia. I am not convinced that the patient actually has a pneumonia.  Patient was started on antibiotics which we will continue given paronychia.   AKI on CKD 3A. Carcinoma of the prostate with metastasis.  Renal function now stabilizing after decompression with Foley catheter. Monitor.   CAD. Stable. On statin.  Anticoagulation currently on hold.   Chronic combined  CHF. Hypertension. Blood pressure soft. Currently on 3.125 mg Coreg and  mexiletine.  Initiating on Lasix.  Not a candidate for Farxiga while on Diflucan. Blood pressure limiting other GDMT's.  History of V. tach storm. Frequent PVCs. NSVT. Patient does have history of significant cardiac arrhythmia and has an ICD. Patient was on amiodarone in the past which was discontinued for hyperthyroidism. Followed by this patient was on mexiletine which appears to be mistakenly patient has stopped taking since July 2023. Discussed with cardiology [name] on 12/18.  Initiated on mexiletine.  If the patient has significant NSVT burden still, they may consider initiation of amiodarone and performing a formal consult at that point.   Type II DM with hyperglycemia with long-term insulin use. Currently continuing basal bolus regimen. Hemoglobin A1c 11.2. Monitor.   Obesity Body mass index is 33.19 kg/m.  Placing the pt at higher risk of poor outcomes.  Goals of care conversation. Extensive discussion with the patient with regards to his goals of care in presence of his son. Patient is alert awake and oriented. Patient wants to have a natural course of death and wants to be DNR/DNI regardless of the outcome and understands it very well. Patient does have multiple comorbidities including chronic systolic CHF with a EF of 25% requiring an AICD with history of V. tach storm and CAD as well as CKD 3A. This is on top of patient's recent diagnosis of highly aggressive small cell prostate metastatic cancer. Consult palliative care.  Monitor further discussion.  Lower extremity edema. Dopplers negative for DVT.  Deconditioning. PT OT reevaluation pending on 12/20.  Family prefers to take the patient home with home health.   Subjective: No acute complaint.  No nausea or vomiting but no fever no chills.  Physical Exam: General: Appear in mild distress; Cardiovascular: S1 and S2 Present, no  Murmur, Respiratory: good respiratory effort, Bilateral Air entry present, cTA, no Crackles, no wheezes Abdomen: Bowel Sound present, Non tender  Extremities: bilateral  Pedal edema Neurology: alert and oriented to time, place, and person  Data Reviewed: I have Reviewed nursing notes, Vitals, and Lab results. I have ordered test including CBC and BMP  . I have discussed pt's care plan and test results with cardiology  .     Disposition: Status is: Inpatient Remains inpatient appropriate because: Need further clarity on GOC to decide on the duration of the antibiotic.  Will require ID involvement.  Need repeat PT OT evaluation.  After all this potentially can go home in 24 hours.  Place TED hose Start: 12/02/22 1319 Place and maintain sequential compression device Start: 11/24/22 1724 SCDs Start: 11/16/22 1423   Family Communication: Family at bedside Level of care: Progressive Continue progressive care unit for now. Vitals:   12/01/22 1231 12/01/22 2025 12/02/22 0636 12/02/22 1331  BP: (!) 102/54 103/88 101/71 (!) 101/52  Pulse: 73 69 77 73  Resp: '20 18 18 18  '$ Temp: 98.1 F (36.7 C) (!) 97.4 F (36.3 C) 98.6 F (37 C) (!) 97.4 F (36.3 C)  TempSrc: Oral Oral Oral Oral  SpO2: 92% 94% 93% 93%  Weight:      Height:         Author: Berle Mull, MD 12/02/2022 9:13 PM  Please look on [email or web address] to find out who is on call.

## 2022-12-02 NOTE — Consult Note (Signed)
Consultation Note Date: 12/02/2022   Patient Name: Wayne [name]  DOB: 04-Dec-1941  MRN: [id number]  Age / Sex: 81 y.o., male   PCP: Wayne Post, MD Referring Physician: Lavina Hamman, MD  Reason for Consultation: Establishing goals of care and symptom management     Chief Complaint/History of Present Illness:   Patient is a 81yo M with a PMHx of CAD s/p PCI, PAF on Eliquis, hypertension, chronic combined CHF status [name] AICD implant, OSA, neuropathy, diabetes mellitus type 2, PMR, prostate cancer status [name] brachytherapy, and bladder cancer who was admitted on 11/16/2022 for management of hematuria and catheter dysfunction.  Urology has been following in the hospital.  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  Patient was seen by oncology while hospitalized due to new cancer diagnosis.  Palliative medicine consulted to assist with complex medical decision making.                                                                       Extensive review of EMR prior to seeing patient.  Presented to bedside and introduced myself and my role as a member of the palliative medicine team to patient.  Patient notes he like to go by "Wayne [name]".  Able to meet patient's wife and daughter who are also at bedside.  Spent extensive time learning about patient's medical journey and how he has been through during this hospitalization and prior to it.  They are able to describe that even for the past 3 months patient's medical status has been deteriorating with him losing weight, having a poor appetite, and losing functional status.  Patient noted that he is "leaning towards" focusing on his symptoms moving forward and not proceeding with cancer directed therapies to though he hopes to  have speak with [name] who is supposed to be coming by this afternoon to answer further questions.  Spent time getting to know about patient and his life outside of the hospital.  He and his family have endured multiple heart rates over the past few years.  Patient noted that his health has been deteriorating since 2009.  They also shared that their son passed away recently due to colon cancer.  Patient's brother also has metastatic cancer he is going through.  Patient noted his mother died with cancer on hospice support.  Empathized with difficult situation and provided support via active listening.  With permission, able to discuss patient's diagnosis of small cell metastatic prostate cancer.  Encourage further conversations with [name] regarding specific details of cancer directed therapies.  They noted that [name] had already told patient he would need "strong chemotherapy" because this cancer is very aggressive.  Agreed with that and discussed with metastatic cancer the goal is to focus on more time, not curing the cancer.  Again discussed what is most important to patient to determine what he wants to focus on quality of time or quantity of time as people value different things.  Able to discuss potential generic pathways moving forward including continuing with aggressive medical management versus transitioning to  focus on patient's comfort for the time he does have.  Discussed the idea of hospice should he want to transition to focusing on comfort.  Family asking about prognosis and so able to inquire about patient's further functional status.  Most of patient's activity has been in the bed at this point.  Patient has had a very poor appetite and has been losing weight.  Patient had worsening kidney function prior to presentation though this is likely due to obstructive dysfunction which is now improving.  Based on patient's functional status and aggressive cancer diagnosis, could be  looking at weeks to short months.  Was honest that this can vary with every patient from day-to-day.  Family voiced appreciation for this information. Was later to return later to provide patient and family with "Gone From My Sight" booklet based on the description they were providing patient going through the stages of reaching the end of life.  Spent time answering questions and offering support.  Noted this provider would plan to follow-up tomorrow after family had time to discuss cancer directed therapy options with [name] later today.  Thank them for allowing me to visit today.  Primary Diagnoses  Present on Admission:  Obstructive uropathy  CAD (coronary artery disease)  CKD (chronic kidney disease) stage 3, GFR 30-59 ml/min (HCC)  Essential hypertension  Hyperlipidemia  Gross hematuria  OSA (obstructive sleep apnea)  Type 2 diabetes mellitus with hyperglycemia (HCC)  Paroxysmal atrial fibrillation (HCC)  Systolic CHF, chronic (HCC)  UTI (urinary tract infection) Probable  Candiduria   Palliative Review of Systems: Patient notes issues with constipation since hospitalization.  Discussed adjusting medications for this.  Past Medical History:  Diagnosis Date   AICD (automatic cardioverter/defibrillator) present    Arthritis    Bladder cancer ([place])    Cancer ([place])    bladder and prostate   Cellulitis of left leg    a. 08/3234 complicated by septic shock   Chronic systolic CHF (congestive heart failure) (HCC)    CKD (chronic kidney disease), stage III ([place])    pt denies   Colon polyps    CORONARY ATHEROSCLEROSIS NATIVE CORONARY ARTERY cardiologist-  dr Aundra Dubin   a. 01/2011 Cath/PCI: LM nl, LAD 40-50p, D1 80-small, LCX 95-small, RI 90, RCA 100, EF 20%;  b. 01/2011 Card MRI - No transmural scar;  c. 01/2011 PCI RCA->5 Promus DES, RI->3.0x16 Promus DES; d. Cath 01/13/13 patent LAD & Ramus, diffuse LCx dz, RCA mult overlapping stents w/ 95% osital stenosis, EF 20%, s/p DES to  ostial/prox RCA 01/24/13    Diabetes mellitus, type 2 ([place])    type 2   Hematoma of leg    a. left leg hematoma 03/2012 in the setting of asa/effient   Herpes zoster ophthalmicus    neuropathy   History of anemia    History of kidney stones    History of non-ST elevation myocardial infarction (NSTEMI) 06/2012   History of prostate cancer    dx 2009---  s/p radioactive seed implants 11-24-2008   HYPERLIPIDEMIA    intolerant to Lipitor (myalgias)   HYPERTENSION    Ischemic cardiomyopathy    a. 01/2012 Echo EF 45%, mild LVH; b. TD32%, grade 1 diastolic dysfunction, diffuse hypokinesis, inferoposterior akinesis    Myocardial infarction ([place])    x2   Noncompliance    Obesity    OSA (obstructive sleep apnea)    dx 2012  severe osa  no cpap just uses nose strips   PAF (paroxysmal atrial  fibrillation) ([place])    first dx 01-16-2017   Peripheral neuropathy    Polymyalgia rheumatica (HCC)    Prostate cancer (HCC)    S/P drug eluting coronary stent placement    total 6 DES   Social History   Socioeconomic History   Marital status: Married    Spouse name: Not on file   Number of children: 1   Years of education: Not on file   Highest education level: Not on file  Occupational History   Occupation: RETIRED    Employer: RETIRED    Comment: TRUCK DRIVER  Tobacco Use   Smoking status: Former    Packs/day: 0.30    Years: 20.00    Total pack years: 6.00    Types: Cigarettes    Quit date: 02/23/1989    Years since quitting: 33.7   Smokeless tobacco: Never  Vaping Use   Vaping Use: Never used  Substance and Sexual Activity   Alcohol use: No   Drug use: No   Sexual activity: Yes  Other Topics Concern   Not on file  Social History Narrative   Retired Administrator   Social Determinants of Health   Financial Resource Strain: [place]  (09/20/2018)   Overall Financial Resource Strain (CARDIA)    Difficulty of Paying Living Expenses: Not hard at all  Food Insecurity: No Food  Insecurity (11/16/2022)   Hunger Vital Sign    Worried About Running Out of Food in the Last Year: Never true    [place] in the Last Year: Never true  Transportation Needs: No Transportation Needs (11/16/2022)   PRAPARE - Hydrologist (Medical): No    Lack of Transportation (Non-Medical): No  Physical Activity: Not on file  Stress: Not on file  Social Connections: Not on file   Family History  Problem Relation Age of Onset   Cancer Mother 22       unknown CA   Heart disease Father    Alcohol abuse Father    Heart attack Father 105   Heart attack Brother    Scheduled Meds:  amoxicillin-clavulanate  1 tablet Oral Q12H   B-complex with vitamin C  1 tablet Oral Daily   carvedilol  3.125 mg Oral BID WC   Chlorhexidine Gluconate Cloth  6 each Topical Daily   dapagliflozin propanediol  5 mg Oral Daily   doxycycline  100 mg Oral Q12H   ferrous sulfate  325 mg Oral Q breakfast   fluconazole  100 mg Oral Daily   insulin aspart  0-15 Units Subcutaneous TID WC   insulin glargine-yfgn  15 Units Subcutaneous Daily   lidocaine  5 mL Infiltration Once   mexiletine  150 mg Oral Q12H   oxybutynin  5 mg Oral TID   Ensure Max Protein  11 oz Oral Daily   rosuvastatin  40 mg Oral q AM   senna-docusate  1 tablet Oral BID   Continuous Infusions:  sodium chloride Stopped (11/20/22 2044)   sodium chloride irrigation     PRN Meds:.sodium chloride, acetaminophen **OR** acetaminophen, haloperidol lactate, HYDROcodone-acetaminophen, ondansetron **OR** ondansetron (ZOFRAN) IV, mouth rinse, phenol, polyethylene glycol Allergies  Allergen Reactions   Entresto [Sacubitril-Valsartan] Swelling and Other (See Comments)    Angioedema   Crestor [Rosuvastatin Calcium] Other (See Comments)    Myalgia and Interferes with gait in high doses   Lipitor [Atorvastatin] Other (See Comments)    Makes legs sore   Plavix [Clopidogrel] Hives,  Itching, Swelling and Other (See  Comments)    Nose bleeds and welts on legs & back   Ancef [Cefazolin] Itching, Rash and Other (See Comments)    Describes itching and rash, but said that "it wasn't that bad"   CBC:    Component Value Date/Time   WBC 9.3 12/01/2022 1047   HGB 9.1 (L) 12/01/2022 1047   HCT 29.2 (L) 12/01/2022 1047   PLT 235 12/01/2022 1047   MCV 90.4 12/01/2022 1047   NEUTROABS 5.7 12/01/2022 1047   LYMPHSABS 2.5 12/01/2022 1047   MONOABS 0.7 12/01/2022 1047   EOSABS 0.3 12/01/2022 1047   BASOSABS 0.1 12/01/2022 1047   Comprehensive Metabolic Panel:    Component Value Date/Time   NA 140 12/01/2022 1047   K 3.8 12/01/2022 1047   CL 110 12/01/2022 1047   CO2 25 12/01/2022 1047   BUN 20 12/01/2022 1047   CREATININE 1.52 (H) 12/01/2022 1047   CREATININE 0.78 06/30/2011 1702   GLUCOSE 94 12/01/2022 1047   CALCIUM 8.8 (L) 12/01/2022 1047   AST 24 11/30/2022 1045   ALT 18 11/30/2022 1045   ALKPHOS 54 11/30/2022 1045   BILITOT 0.4 11/30/2022 1045   PROT 5.4 (L) 11/30/2022 1045   ALBUMIN 2.3 (L) 11/30/2022 1045    Physical Exam: Vital Signs: BP 101/71 (BP Location: Left Arm)   Pulse 77   Temp 98.6 F (37 C) (Oral)   Resp 18   Ht '5\' 8"'$  (1.727 m)   Wt 99 kg   SpO2 93%   BMI 33.19 kg/m  SpO2: SpO2: 93 % O2 Device: O2 Device: Room Air O2 Flow Rate: O2 Flow Rate (L/min): 2 L/min Intake/output summary:  Intake/Output Summary (Last 24 hours) at 12/02/2022 0958 Last data filed at 12/02/2022 3220 Gross per 24 hour  Intake 120 ml  Output 1050 ml  Net -930 ml   LBM: Last BM Date : 12/01/22 Baseline Weight: Weight: 101.2 kg Most recent weight: Weight: 99 kg  General: NAD, alert, pleasant, laying in bed Eyes: No drainage noted HENT: moist mucous membranes Cardiovascular: RRR Respiratory: no increased work of breathing noted, not in respiratory distress Abdomen: not distended Extremities: Right hand in bandage Neuro: A&Ox4, following commands easily Psych: appropriately answers all  questions          Palliative Performance Scale: 40%              Additional Data Reviewed: Recent Labs    11/30/22 1045 12/01/22 1047  WBC 9.6 9.3  HGB 9.1* 9.1*  PLT 239 235  NA 140 140  BUN 21 20  CREATININE 1.63* 1.52*    Imaging: VAS [place] LOWER EXTREMITY VENOUS (DVT)  Lower Venous DVT Study  Patient Name:  AMES HOBAN  Date of Exam:   12/01/2022 Medical Rec #: [id number]     Accession #:    [id number] Date of Birth: 1941/06/20     Patient Gender: M Patient Age:   6 years Exam Location:  [facility] Procedure:      VAS [place] LOWER EXTREMITY VENOUS (DVT) Referring Phys: PRANAV PATEL  --------------------------------------------------------------------------------   Indications: Pain and swelling, LT>RT.   Risk Factors: Cancer - bladder and prostate. History of CHF. Comparison Study: No recent prior studies.  Performing Technologist: Darlin Coco RDMS, RVT    Examination Guidelines: A complete evaluation includes B-mode imaging, spectral Doppler, color Doppler, and power Doppler as needed of all accessible portions of each vessel. Bilateral testing is considered an integral part  of a complete examination. Limited examinations for reoccurring indications may be performed as noted. The reflux portion of the exam is performed with the patient in reverse Trendelenburg.     +---------+---------------+---------+-----------+----------+--------------+ RIGHT    CompressibilityPhasicitySpontaneityPropertiesThrombus Aging +---------+---------------+---------+-----------+----------+--------------+ CFV      Full           Yes      Yes                                 +---------+---------------+---------+-----------+----------+--------------+ SFJ      Full                                                        +---------+---------------+---------+-----------+----------+--------------+ FV Prox  Full                                                         +---------+---------------+---------+-----------+----------+--------------+ FV Mid   Full                                                        +---------+---------------+---------+-----------+----------+--------------+ FV DistalFull                                                        +---------+---------------+---------+-----------+----------+--------------+ PFV      Full                                                        +---------+---------------+---------+-----------+----------+--------------+ POP      Full           Yes      Yes                                 +---------+---------------+---------+-----------+----------+--------------+ PTV      Full                                                        +---------+---------------+---------+-----------+----------+--------------+ PERO     Full                                                        +---------+---------------+---------+-----------+----------+--------------+        +---------+---------------+---------+-----------+----------+--------------+ LEFT     CompressibilityPhasicitySpontaneityPropertiesThrombus Aging +---------+---------------+---------+-----------+----------+--------------+  CFV      Full           Yes      Yes                                 +---------+---------------+---------+-----------+----------+--------------+ SFJ      Full                                                        +---------+---------------+---------+-----------+----------+--------------+ FV Prox  Full                                                        +---------+---------------+---------+-----------+----------+--------------+ FV Mid   Full                                                        +---------+---------------+---------+-----------+----------+--------------+ FV DistalFull                                                         +---------+---------------+---------+-----------+----------+--------------+ PFV      Full                                                        +---------+---------------+---------+-----------+----------+--------------+ POP      Full           Yes      Yes                                 +---------+---------------+---------+-----------+----------+--------------+ PTV      Full                                                        +---------+---------------+---------+-----------+----------+--------------+ PERO     Full                                                        +---------+---------------+---------+-----------+----------+--------------+             Summary: RIGHT: - There is no evidence of deep vein thrombosis in the lower extremity.   - No cystic structure found in the popliteal fossa.   LEFT: - There is no evidence of deep vein thrombosis in the lower extremity.   -  No cystic structure found in the popliteal fossa.    *See table(s) above for measurements and observations.  Electronically signed by Orlie Pollen on 12/01/2022 at 5:39:58 PM.      Final     I personally reviewed recent imaging.   Palliative Care Assessment and Plan Summary of Established Goals of Care and Medical Treatment Preferences   Patient is a 81yo M with a PMHx of CAD s/p PCI, PAF on Eliquis, hypertension, chronic combined CHF status [name] AICD implant, OSA, neuropathy, diabetes mellitus type 2, PMR, prostate cancer status [name] brachytherapy, and bladder cancer who was admitted on 11/16/2022 for management of hematuria and catheter dysfunction.  Urology has been following in the hospital.  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  Patient  was seen by oncology while hospitalized due to new cancer diagnosis.  Palliative medicine consulted to assist with complex medical decision making.                             # Complex medical decision making/goals of care  -Extensive conversation with patient and family as described above in HPI in detail.  Discussed in general pathways for medical care moving forward including continuing with aggressive medical management versus transitioning to comfort focused care in the setting of an aggressive small cell carcinoma of the prostate.  Family appreciated information.  Family planning to speak with [name] later today to further determine what cancer directed therapies would look like and entail.  Patient clear that he does not want his remaining time to be spent suffering from side effects from chemotherapy and "in misery".  -Introduced the idea of palliative medicine versus hospice care.  -  Code Status: DNR  Prognosis: Based on decline in functional status, decreased oral intake,multiple other comorbidities, and in setting of progressive small cell metastatic prostate cancer, currently would consider prognosis weeks to short months.  Patient appropriate for hospice with prognosis overall of 6 months or less.  # Symptom management  -Constipation   -Changed to senna only tablet one 8.6 milligram twice daily   -Scheduled MiraLAX 17 g daily.  # Psycho-social/Spiritual Support:  -Support System: wife, daughter  -Provided copies of "Gone From My Sight"  # Discharge Planning:  TBD  Thank you for allowing the palliative care team to participate in the care Kelton Pillar.  Chelsea Aus, DO Palliative Care Provider PMT # [phone]  If patient remains symptomatic despite maximum doses, please call PMT at [phone] between 0700 and 1900. Outside of these hours, please call attending, as PMT does not have night coverage.  This provider spent a total of 83 minutes providing patient's care.   Includes review of EMR, discussing care with other staff members involved in patient's medical care, obtaining relevant history and information from patient and/or patient's family, and personal review of imaging and lab work. Greater than 50% of the time was spent counseling and coordinating care related to the above assessment and plan.

## 2022-12-02 NOTE — Progress Notes (Signed)
  Echocardiogram 2D Echocardiogram has been performed.  Wayne [name] 12/02/2022, 3:46 PM

## 2022-12-02 NOTE — Telephone Encounter (Signed)
[name] called and stated Wayne [name] is admitted at [facility]. She said they are stopping some of his heart medications and she asked if Dr. Aundra Dubin could review records. She is concerned with how this could affect him.

## 2022-12-02 NOTE — Telephone Encounter (Signed)
It looks like he has had a complicated hospitalization with hematuria and bladder outlet obstruction and they have found metastatic small cell prostate cancer.  BP has been low and he has had AKI due to obstruction that is improving.  They should slowly and gradually start him back on his cardiac medication regimen.  Would recommend cardiology consultation, would be one of my partners at [facility].

## 2022-12-02 NOTE — TOC Progression Note (Signed)
Transition of Care [facility]) - Progression Note    Patient Details  Name: Wayne [name] MRN: [id number] Date of Birth: 09-27-1941  [facility] CM/SW Contact  Zayon Trulson, Juliann Pulse, RN Phone Number: 12/02/2022, 3:04 PM  Clinical Narrative:spouse [name] pleasantly decline SNF prefers HHC-already following [facility] rep [facility]-HHTP/OT/aide/csw. May need PTAR @ d/c spouse undecided.       Expected Discharge Plan: [place] Barriers to Discharge: Continued Medical Work up  Expected Discharge Plan and Services Expected Discharge Plan: [place]   Discharge Planning Services: CM Consult   Living arrangements for the past 2 months: Single Family Home                           HH Arranged: PT [place]: [place] Date [place]: 11/30/22 Time [place]: [place] Representative spoke with at [place]: [place] ([place]) Interventions    Readmission Risk Interventions     No data to display

## 2022-12-03 ENCOUNTER — Ambulatory Visit: Payer: Medicare Other | Admitting: Nurse Practitioner

## 2022-12-03 DIAGNOSIS — Z7189 Other specified counseling: Secondary | ICD-10-CM | POA: Diagnosis not present

## 2022-12-03 DIAGNOSIS — D62 Acute posthemorrhagic anemia: Secondary | ICD-10-CM | POA: Diagnosis not present

## 2022-12-03 DIAGNOSIS — Z515 Encounter for palliative care: Secondary | ICD-10-CM | POA: Diagnosis not present

## 2022-12-03 DIAGNOSIS — N139 Obstructive and reflux uropathy, unspecified: Secondary | ICD-10-CM | POA: Diagnosis not present

## 2022-12-03 DIAGNOSIS — I2510 Atherosclerotic heart disease of native coronary artery without angina pectoris: Secondary | ICD-10-CM | POA: Diagnosis not present

## 2022-12-03 DIAGNOSIS — B3749 Other urogenital candidiasis: Secondary | ICD-10-CM | POA: Diagnosis not present

## 2022-12-03 LAB — BASIC METABOLIC PANEL
Anion gap: 7 (ref 5–15)
BUN: 16 mg/dL (ref 8–23)
CO2: 25 mmol/L (ref 22–32)
Calcium: 8.2 mg/dL — ABNORMAL LOW (ref 8.9–10.3)
Chloride: 109 mmol/L (ref 98–111)
Creatinine, Ser: 1.44 mg/dL — ABNORMAL HIGH (ref 0.61–1.24)
GFR, Estimated: 49 mL/min — ABNORMAL LOW (ref >60)
Glucose, Bld: 99 mg/dL (ref 70–99)
Potassium: 3 mmol/L — ABNORMAL LOW (ref 3.5–5.1)
Sodium: 141 mmol/L (ref 135–145)

## 2022-12-03 LAB — CBC
HCT: 28 % — ABNORMAL LOW (ref 39.0–52.0)
Hemoglobin: 8.7 g/dL — ABNORMAL LOW (ref 13.0–17.0)
MCH: 27.9 pg (ref 26.0–34.0)
MCHC: 31.1 g/dL (ref 30.0–36.0)
MCV: 89.7 fL (ref 80.0–100.0)
Platelets: 219 10*3/uL (ref 150–400)
RBC: 3.12 MIL/uL — ABNORMAL LOW (ref 4.22–5.81)
RDW: 14.1 % (ref 11.5–15.5)
WBC: 7 10*3/uL (ref 4.0–10.5)
nRBC: 0 % (ref 0.0–0.2)

## 2022-12-03 LAB — GLUCOSE, CAPILLARY
Glucose-Capillary: 94 mg/dL (ref 70–99)
Glucose-Capillary: 119 mg/dL — ABNORMAL HIGH (ref 70–99)
Glucose-Capillary: 88 mg/dL (ref 70–99)
Glucose-Capillary: 97 mg/dL (ref 70–99)

## 2022-12-03 LAB — ACID FAST SMEAR (AFB, MYCOBACTERIA): Acid Fast Smear: NEGATIVE

## 2022-12-03 LAB — MAGNESIUM: Magnesium: 1.8 mg/dL (ref 1.7–2.4)

## 2022-12-03 MED ORDER — POTASSIUM CHLORIDE CRYS ER 20 MEQ PO TBCR
40.0000 meq | EXTENDED_RELEASE_TABLET | ORAL | Status: AC
  Administered 2022-12-03 – 2022-12-04 (×3): 40 meq via ORAL
  Filled 2022-12-03 (×3): qty 2

## 2022-12-03 MED ORDER — INSULIN ASPART 100 UNIT/ML IJ SOLN: 0.0000 [IU] | Freq: Three times a day (TID) | INTRAMUSCULAR | Status: DC

## 2022-12-03 NOTE — Progress Notes (Addendum)
[facility]  PROGRESS NOTE  Wayne [name] FWY:[id number] DOB: 08-25-1941 DOA: 11/16/2022 PCP: [name], MD   Brief HPI:   81 y.o. PMH of CAD SP PCI, PAF on Eliquis, HTN, chronic combined CHF, OSA, neuropathy, type II DM, PMR, prostate cancer SP brachytherapy, bladder cancer, AICD implant. Presented to hospital with complaints of hematuria and catheter dysfunction. Urology following.  Developed hospital induced delirium. 12/3 cystoscopy with clot evacuation and fulguration.  Initiated on CBI.  Started on Cipro. 12/5 CT-guided biopsy of left iliac and axillary lymph nodes. 12/8 started on fluconazole for yeast in urine 12/12 TURP, TURBT, bilateral pyelogram and ureteral stent placement.  Started on IV antibiotic for pneumonia 12/14 found to have left thumb paronychia doxycycline was added.  Underwent bedside debridement with hand surgery 12/15 biopsies came back positive for small cell metastatic prostate carcinoma.  Medical oncology was consulted.  Foley catheter was removed. 12/16 Foley catheter reinserted due to retention. 12/17 patient decided to transition to DNR/DNI from full code had frequent PVCs on telemetry.  Wound cultures came back positive from the thumb for Candida as well.  Workup for candidemia initiated.  Discussed with ID.  Recommend to continue oral fluconazole. 12/18 discussed with [name] from cardiology due to NSVT, initiated on mexiletine.  Palliative care consulted 12/19 echocardiogram shows evidence of aortic valve thickening.  Will require ID involvement based on goals of care conversation after [name] discussion with the family.      Subjective   Patient seen and examined, denies any complaints.     Assessment/Plan:     Gross hematuria. bladder cancer and prostate cancer. 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 Obstructive uropathy. Seen in ED by urology. Underwent cystoscopy on 12/3 and 12/12. Currently urine  appears to be clear. Treated with CBI. Patient had retention issues after removal of the Foley catheter on 12/15 and therefore Foley was inserted on 12/16 again.  Plan to discharge home with Foley catheter  Anticoagulation is permanently discontinued due to frequent hematuria on anticoagulation.   Candida UTI. Candida in left thumb cultures Aortic valve thickness. Concern for candidemia. Currently on fluconazole.   Initial plan was for 14-day therapy.  Will continue for longer duration for now. Anticoagulation had to change to Lovenox for that.  Now stopped. Patient's cultures from the left thumb also growing Candida.  There is a concern that the patient may actually have a candidemia. Blood cultures performed.  So far no growth. Aortic valve thickness seen on the echocardiogram.  Recommend to discuss with ID based on consultation between palliative care and oncology.  Hypokalemia -Potassium is 3.0 -Replace potassium and follow BMP in am   History of bladder cancer. Biopsy positive for metastatic small cell prostate cancer. Discussed with [name].  Will monitor recommendation. Otherwise management per urology.   lymphoma/SLL. Axillary lymph node biopsy positive for possible lymphoma/SLL. Hematology consulted with [name]. Will monitor recommendation   Left thumb paronychia Was on IV Zosyn and doxycycline.  Will switch to oral Augmentin. Follow-up on cultures.  Can narrow down antibiotic to Augmentin only. Highly appreciate hand surgery consultation.  Underwent bedside debridement. Continue daily dressing changes.   Hospital induced delirium. Resolved.  Continue as needed Haldol   ?  Aspiration pneumonia -Patient is already on IV Zosyn as above   AKI on CKD 3A. Carcinoma of the prostate with metastasis.  Renal function now stabilizing after decompression with Foley catheter. Monitor.   CAD. Stable. On statin.  Anticoagulation currently on hold.   Chronic combined  CHF. Hypertension. Blood pressure soft. Currently on 3.125 mg Coreg and mexiletine.  -Started on Lasix 20 mg daily  -Not a candidate for [place] while on Diflucan. Blood pressure limiting other GDMT's.   History of V. tach storm. Frequent PVCs. NSVT. Patient does have history of significant cardiac arrhythmia and has an ICD. Patient was on amiodarone in the past which was discontinued for hyperthyroidism. Followed by this patient was on mexiletine which appears to be mistakenly patient has stopped taking since July 2023. Discussed with cardiology [name] on 12/18.  Initiated on mexiletine.  If the patient has significant NSVT burden still, they may consider initiation of amiodarone and performing a formal consult at that point.   Type II DM with hyperglycemia with long-term insulin use. Currently continuing basal bolus regimen. Hemoglobin A1c 11.2. Monitor.   Obesity Body mass index is 33.19 kg/m.  Placing the pt at higher risk of poor outcomes.   Goals of care conversation. [name] had extensive discussion with patient's family  with regards to his goals of care in presence of his son. Patient wants to have a natural course of death and wants to be DNR/DNI regardless of the outcome and understands it very well. Patient does have multiple comorbidities including chronic systolic CHF with a EF of 25% requiring an AICD with history of V. tach storm and CAD as well as CKD 3A. This is on top of patient's recent diagnosis of highly aggressive small cell prostate metastatic cancer. Consulted palliative care.    Lower extremity edema. Dopplers negative for DVT.   Deconditioning. PT OT reevaluation pending on 12/20.  Family prefers to take the patient home with home health.    Medications     amoxicillin-clavulanate  1 tablet Oral Q12H   aspirin EC  81 mg Oral Daily   B-complex with vitamin C  1 tablet Oral Daily   carvedilol  3.125 mg Oral BID WC   Chlorhexidine  Gluconate Cloth  6 each Topical Daily   doxycycline  100 mg Oral Q12H   ferrous sulfate  325 mg Oral Q breakfast   fluconazole  100 mg Oral Daily   furosemide  20 mg Oral Daily   [START ON 12/04/2022] insulin aspart  0-6 Units Subcutaneous TID WC   insulin glargine-yfgn  15 Units Subcutaneous Daily   lidocaine  5 mL Infiltration Once   mexiletine  150 mg Oral Q12H   oxybutynin  5 mg Oral TID   polyethylene glycol  17 g Oral Daily   potassium chloride  40 mEq Oral Q4H   Ensure Max Protein  11 oz Oral Daily   rosuvastatin  40 mg Oral q AM   senna  1 tablet Oral BID     Data Reviewed:   CBG:  Recent Labs  Lab 12/02/22 1643 12/02/22 2134 12/03/22 0721 12/03/22 1145 12/03/22 1623  GLUCAP 104* 125* 94 119* 88    SpO2: 96 % O2 Flow Rate (L/min): 2 L/min    Vitals:   12/03/22 0554 12/03/22 1016 12/03/22 1147 12/03/22 1757  BP: (!) 116/54 (!) 100/54 (!) 110/54 (!) 96/50  Pulse: 73 71 73 69  Resp: 17  20   Temp: 98.1 F (36.7 C)  98 F (36.7 C)   TempSrc: Oral     SpO2: 97%  96%   Weight:      Height:          Data Reviewed:  Basic  Metabolic Panel: Recent Labs  Lab 11/27/22 0444 11/28/22 0502 11/29/22 0624 11/30/22 1045 12/01/22 1047 12/03/22 0457  NA 138 139 139 140 140 141  K 4.6 3.7 3.6 4.2 3.8 3.0*  CL 108 107 107 109 110 109  CO2 '23 24 23 24 25 25  '$ GLUCOSE 190* 138* 107* 107* 94 99  BUN 39* 29* 26* '21 20 16  '$ CREATININE 1.98* 1.67* 1.51* 1.63* 1.52* 1.44*  CALCIUM 8.7* 8.8* 8.9 8.9 8.8* 8.2*  MG 2.3 2.2 2.0 1.9  --  1.8  PHOS 2.8 2.0* 2.0*  --   --   --     CBC: Recent Labs  Lab 11/28/22 0502 11/29/22 0624 11/30/22 1045 12/01/22 1047 12/03/22 0457  WBC 8.8 8.8 9.6 9.3 7.0  NEUTROABS  --   --  5.9 5.7  --   HGB 8.9* 9.6* 9.1* 9.1* 8.7*  HCT 28.7* 30.7* 29.5* 29.2* 28.0*  MCV 89.4 89.2 91.0 90.4 89.7  PLT 238 256 239 235 219    LFT Recent Labs  Lab 11/27/22 0444 11/28/22 0502 11/29/22 0624 11/30/22 1045  AST  --   --   --  24   ALT  --   --   --  18  ALKPHOS  --   --   --  54  BILITOT  --   --   --  0.4  PROT  --   --   --  5.4*  ALBUMIN 2.7* 2.4* 2.6* 2.3*     Antibiotics: Anti-infectives (From admission, onward)    Start     Dose/Rate Route Frequency Ordered Stop   11/28/22 2000  amoxicillin-clavulanate (AUGMENTIN) 875-125 MG per tablet 1 tablet        1 tablet Oral Every 12 hours 11/28/22 1038     11/27/22 1515  doxycycline (VIBRA-TABS) tablet 100 mg        100 mg Oral Every 12 hours 11/27/22 1422     11/26/22 1000  fluconazole (DIFLUCAN) tablet 100 mg        100 mg Oral Daily 11/26/22 0843     11/26/22 1000  piperacillin-tazobactam (ZOSYN) IVPB 3.375 g  Status:  Discontinued        3.375 g 12.5 mL/hr over 240 Minutes Intravenous Every 8 hours 11/26/22 0853 11/28/22 1038   11/25/22 2000  ceFEPIme (MAXIPIME) 2 g in sodium chloride 0.9 % 100 mL IVPB  Status:  Discontinued        2 g 200 mL/hr over 30 Minutes Intravenous Every 12 hours 11/25/22 1620 11/26/22 0751   11/25/22 1330  Ampicillin-Sulbactam (UNASYN) 3 g in sodium chloride 0.9 % 100 mL IVPB        3 g 200 mL/hr over 30 Minutes Intravenous  Once 11/24/22 1659 11/25/22 1406   11/24/22 1000  fluconazole (DIFLUCAN) IVPB 100 mg  Status:  Discontinued        100 mg 50 mL/hr over 60 Minutes Intravenous Every 24 hours 11/24/22 0832 11/26/22 0843   11/23/22 1000  fluconazole (DIFLUCAN) tablet 200 mg  Status:  Discontinued        200 mg Oral Daily 11/22/22 1713 11/24/22 0832   11/21/22 1200  fluconazole (DIFLUCAN) IVPB 200 mg  Status:  Discontinued        200 mg 100 mL/hr over 60 Minutes Intravenous Every 24 hours 11/21/22 0909 11/22/22 1713   11/16/22 1300  ciprofloxacin (CIPRO) IVPB 400 mg        400 mg 200 mL/hr over 60  Minutes Intravenous Every 12 hours 11/16/22 1245 11/21/22 2233   11/16/22 1159  ciprofloxacin (CIPRO) 400 MG/200ML IVPB       Note to Pharmacy: Wayne [name] A: cabinet override      11/16/22 1159 11/16/22 1221        DVT  prophylaxis: TED hose  Code Status: DNR  Family Communication: No family at bedside   CONSULTS oncology, urology   Objective    Physical Examination:   General: Appears in no acute distress Cardiovascular: S1-S2, regular Respiratory: Clear to auscultation bilaterally Abdomen: Soft, nontender, no organomegaly Extremities: No edema in the lower extremities Neurologic: Alert, oriented x 3, intact insight and judgment, no focal deficit noted   Status is: Inpatient:             Oswald Hillock   [facility] If 7PM-7AM, please contact night-coverage at [email or web address], Office  [phone]   12/03/2022, 6:27 PM  LOS: 16 days

## 2022-12-03 NOTE — TOC Progression Note (Signed)
Transition of Care [facility]) - Progression Note    Patient Details  Name: Wayne [name] MRN: [id number] Date of Birth: 11-20-1941  Transition of Care [facility]) CM/SW Contact  Leeroy Cha, RN Phone Number: 12/03/2022, 9:13 AM  Clinical Narrative:    Phoebe Perch sent out to [place] and [place] for review.  Wife would like a facility [place] and [place]    Expected Discharge Plan: [place] Barriers to Discharge: Continued Medical Work up  Expected Discharge Plan and Services Expected Discharge Plan: [place]   Discharge Planning Services: CM Consult   Living arrangements for the past 2 months: Single Family Home                           HH Arranged: PT [place]: [place] Date [place]: 11/30/22 Time [place]: [place] Representative spoke with at [place]: [place] ([place]) Interventions    Readmission Risk Interventions   No data to display

## 2022-12-03 NOTE — TOC Progression Note (Signed)
Transition of Care [facility]) - Progression Note    Patient Details  Name: Wayne [name] MRN: [id number] Date of Birth: Aug 08, 1941  Transition of Care [facility]) CM/SW Contact  Leeroy Cha, RN Phone Number: 12/03/2022, 8:52 AM  Clinical Narrative:    Tcf-wife/explained the difference between hhc and inpatient rehab. Intensity versus home and inpatient.  5 days a week versus 3 times a week . Spoke with patient also now wants to go to rehab.  Wants facility near the home.  [place] [address] Will start process.   Expected Discharge Plan: [place] Barriers to Discharge: Continued Medical Work up  Expected Discharge Plan and Services Expected Discharge Plan: [place]   Discharge Planning Services: CM Consult   Living arrangements for the past 2 months: Single Family Home                           HH Arranged: PT [place]: [place] Date [place]: 11/30/22 Time [place]: [place] Representative spoke with at [place]: [place] ([place]) Interventions    Readmission Risk Interventions   No data to display

## 2022-12-03 NOTE — Progress Notes (Signed)
Daily Progress Note   Patient Name: Wayne [name]       Date: 12/03/2022 DOB: Apr 24, 1941  Age: 81 y.o. MRN#: [id number] Attending Physician: Oswald Hillock, MD Primary Care Physician: [name], MD Admit Date: 11/16/2022 Length of Stay: 16 days  Reason for Consultation/Follow-up: Establishing goals of care and symptom management  Subjective:   CC: Following up with patient who is comfortable today laying in bed regarding complex medical decision making after discussions yesterday.  Subjective:  Reviewed EMR prior to seeing patient today.  Scented to bedside in afternoon.  Reintroduced myself as a member of the palliative medicine team.  Patient remembers this provider from our extensive discussions yesterday.  Patient has a couple of friends at bedside visiting.  Patient's wife and daughter are not at bedside and noted they had run some errands.  Patient was hoping to again have a meeting with his wife and daughter present so that we could all discuss pathways for medical care moving forward.  Noted would call his wife after this visit to assist in coordination of care.  Patient voiced appreciation for this and for visit today.  Able to call wife, Wayne [name], after visit with patient this afternoon.  She was out running errands after she had stayed the night with patient.  Wife voiced frustration that they were unable to speak with the oncologist as a and yesterday.  They describe that they had called the oncologist office who noted that he would be present in the hospital after 1 PM and so she stayed overnight with hopes that he would stop by at some point but they were unable to meet with him.  Wife expressed wanting to ask oncologist questions regarding patient's aggressive cancer to help in determining which pathway for medical care they want to continue with pursuing.  Whether it is aggressive medical management and patient going to rehab to pursue these therapies versus going home with  hospice support.  Acknowledged wish for further information and discussions and able to make informed decisions.  Spent time of providing emotional support.  Thanked wife for allowing me to call her today and noted would reach out to oncologist.  Review of Systems No symptoms of concern at this time. Objective:   Vital Signs:  BP (!) 100/54   Pulse 71   Temp 98.1 F (36.7 C) (Oral)   Resp 17   Ht '5\' 8"'$  (1.727 m)   Wt 99 kg   SpO2 97%   BMI 33.19 kg/m   Physical Exam: General: NAD, alert, pleasant, laying in bed Eyes: No drainage noted HENT: moist mucous membranes Cardiovascular: RRR Respiratory: no increased work of breathing noted, not in respiratory distress Abdomen: not distended Extremities: Right hand in bandage Neuro: A&Ox4, following commands easily Psych: appropriately answers all questions  Imaging:  I personally reviewed recent imaging.   Assessment & Plan:   Assessment:  Patient is a 80yo M with a PMHx of CAD s/p PCI, PAF on Eliquis, hypertension, chronic combined CHF status post AICD implant, OSA, neuropathy, diabetes mellitus type 2, PMR, prostate cancer status post brachytherapy, and bladder cancer who was admitted on 11/16/2022 for management of hematuria and catheter dysfunction.  Urology has been following in the hospital.  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  Patient was seen by oncology while hospitalized  due to new cancer diagnosis.  Palliative medicine consulted to assist with complex medical decision making.             Recommendations/Plan: # Complex medical decision making/goals of care:  - Had extensive conversation with patient, his wife, and his daughter on 12/02/2022.  Attempted to follow-up today though family not at  bedside.  Patient wanting discussions to continue with all family members present to support determining pathway for medical care moving forward.  -Able to call patient's wife as detailed above in HPI.  Family looking further insight from oncologist to further answered her questions to help in determining continuing with aggressive medical management and pursuing rehab and such versus transition to comfort focused care and returning home with hospice.  Reached out to hospitalist and oncologist to assist with coordination of care.  -  Code Status: DNR  # Symptom management:  --Constipation                               -Continue senna one 8.6 mg tablet twice daily                               -Scheduled MiraLAX 17 g daily.  # Psychosocial Support: -Support System: wife, daughter                -Provided copies of "Gone From My Sight" on 12/03/22  # Discharge Planning: TBD  Discussed with: Bedside RN, hospitalist, oncologist, patient, wife  Thank you for allowing the palliative care team to participate in the care Kel[place] Pillar.  Chelsea Aus, DO Palliative Care Provider PMT # [phone]  If patient remains symptomatic despite maximum doses, please call PMT at [phone] between 0700 and 1900. Outside of these hours, please call attending, as PMT does not have night coverage.  This provider spent a total of 42 minutes providing patient's care.  Includes review of EMR, discussing care with other staff members involved in patient's medical care, obtaining relevant history and information from patient and/or patient's family, and personal review of imaging and lab work. Greater than 50% of the time was spent counseling and coordinating care related to the above assessment and plan.

## 2022-12-03 NOTE — Progress Notes (Signed)
Nutrition Follow-up  INTERVENTION:   -Ensure MAX Protein po daily, each supplement provides 150 kcal and 30 grams of protein    NUTRITION DIAGNOSIS:   Increased nutrient needs related to chronic illness as evidenced by estimated needs.  Ongoing.  GOAL:   Patient will meet greater than or equal to 90% of their needs  Progressing.  MONITOR:   PO intake, Weight trends  ASSESSMENT:   81 y.o. male with PMH significant of cardiac arrest, AICD placement, coronary arthrosclerosis, history of NSTEMI, drug-eluting stent placement, paroxysmal atrial fibrillation, HTn, chronic combined systolic and diastolic heart failure, osteoarthritis, cellulitis of the left lower extremity, history of anemia, obesity, OSA not on CPAP, peripheral neuropathy, DMII, polymyalgia rheumatica, urolithiasis, bladder cancer, prostate cancer, recurrent high-grade T1 bladder cancer, urethral stricture requiring multiple dilatations and recurring UTIs who presented with gross hematuria and Foley catheter dysfunction.  2/12: s/p TURP, TURBT, bilateral retrograde pyelogram, bilateral ureteral stent placement   Patient consuming ~40% of meals at this time.  Patient accepting Ensure Max. Per palliative care note, pt now DNR.   Admission weight: 223 lbs Current weight: 218 lbs  Medications: B complex w/ vitamin C, Ferrous sulfate, Lasix, Miralax, KLOR-CON, Senokot  Labs reviewed: CBGs: 91-125 Low K  Diet Order:   Diet Order             Diet Carb Modified Fluid consistency: Thin; Room service appropriate? Yes  Diet effective now                   EDUCATION NEEDS:   No education needs have been identified at this time  Skin:  Skin Assessment: Skin Integrity Issues: Skin Integrity Issues:: Incisions Incisions: 12/3 perineum  Last BM:  12/18  Height:   Ht Readings from Last 1 Encounters:  11/25/22 '5\' 8"'$  (1.727 m)    Weight:   Wt Readings from Last 1 Encounters:  11/25/22 99 kg    BMI:   Body mass index is 33.19 kg/m.  Estimated Nutritional Needs:   Kcal:  2050-2200 kcals  Protein:  100-120 grams  Fluid:  >/= 2L  Clayton Bibles, MS, RD, LDN Inpatient Clinical Dietitian Contact information available via Amion

## 2022-12-03 NOTE — NC FL2 (Signed)
[place] LEVEL OF CARE FORM     IDENTIFICATION  Patient Name: Wayne [name] Birthdate: Nov 11, 1941 Sex: male Admission Date (Current Location): 11/16/2022  [facility] and [place] Number:  Herbalist and Address:  [facility],  [place], [place]      Provider Number: [id number]  Attending Physician Name and Address:  Oswald Hillock, MD  Relative Name and Phone Number:  Arvine Clayburn [phone]  wife    Current Level of Care: Hospital Recommended Level of Care: [place] Prior Approval Number:    Date Approved/Denied:   PASRR Number: [id number]  Discharge Plan: SNF    Current Diagnoses: Patient Active Problem List   Diagnosis Date Noted   Palliative care encounter 12/02/2022   Goals of care, counseling/discussion 12/02/2022   Counseling and coordination of care 12/02/2022   Constipation 12/02/2022   Small cell carcinoma ([place]) 12/02/2022   Metastatic malignant neoplasm ([place]) 12/02/2022   Need for emotional support 12/02/2022   Paronychia of right thumb 11/28/2022   NSVT (nonsustained ventricular tachycardia) ([place]) 11/25/2022   HCAP (healthcare-associated pneumonia) 11/25/2022   Acute blood loss anemia 11/24/2022   Hypotension 11/23/2022   Candiduria 11/21/2022   UTI (urinary tract infection) Probable 11/20/2022   Obstructive uropathy 11/16/2022   Type 2 diabetes mellitus with hyperglycemia ([place]) 11/16/2022   ICD (implantable cardioverter-defibrillator) in place 11/30/2019   Ischemic cardiomyopathy 11/30/2019   Bladder cancer ([place]) 11/09/2017   Low back pain radiating to left lower extremity 09/21/2017   Foot drop, left 08/24/2017   Diabetes mellitus ([place])    ICD (implantable cardioverter-defibrillator) discharge 07/05/2017   Syncope and collapse    Ventricular tachycardia ([place]) 06/18/2017   Paroxysmal atrial fibrillation ([place]) 06/10/2017   Hypoxemia    VT (ventricular tachycardia) ([place])    Cardiac  arrest ([place]) 05/22/2017   Defibrillator discharge 02/11/2017   Atrial fibrillation with rapid ventricular response ([place]) 01/16/2017   Coronary stent occlusion 11/18/2016   Type 2 diabetes, uncontrolled, with neuropathy 09/30/2016   CKD (chronic kidney disease) stage 3, GFR 30-59 ml/min (HCC) 09/30/2016   CAD (coronary artery disease) 11/20/2015   Leucocytoclastic vasculitis ([place]) 06/25/2012   Staphylococcus aureus bacteremia with sepsis ([place]) 06/18/2012   NSTEMI, initial episode of care ([place]) 06/16/2012   Cellulitis and abscess of lower leg 06/14/2012   Healthcare-associated pneumonia 06/14/2012   Hyperglycemia 06/14/2012   Leg swelling 04/22/2012   Urethral stricture 04/15/2012   Gross hematuria 03/22/2012   Hematochezia 03/22/2012   Obesity 01/08/2012   ED (erectile dysfunction) of organic origin 09/11/2011   OSA (obstructive sleep apnea) 09/30/5101   Systolic CHF, chronic ([place]) 06/10/2011   HEMATOCHEZIA 02/10/2011   BURSITIS, LEFT HIP 02/10/2011   CORONARY ATHEROSCLEROSIS NATIVE CORONARY ARTERY 01/27/2011   CHEST PAIN UNSPECIFIED 01/20/2011   DYSPNEA 01/16/2011   ABDOMINAL PAIN, UNSPECIFIED SITE 01/13/2011   ECZEMA 01/28/2010   BRONCHITIS, ACUTE WITH MILD BRONCHOSPASM 12/21/2009   HERPES ZOSTER OPHTHALMICUS 07/19/2009   ADENOCARCINOMA, PROSTATE 06/20/2009   Secondary DM with peripheral vascular disease, uncontrolled 06/20/2009   Hyperlipidemia 06/20/2009   Essential hypertension 06/20/2009   HYPERTROPHY PROSTATE W/UR OBST & OTH LUTS 06/20/2009    Orientation RESPIRATION BLADDER Height & Weight     Time, Situation, Self, Place  Normal Continent Weight: 99 kg Height:  '5\' 8"'$  (172.7 cm)  BEHAVIORAL SYMPTOMS/MOOD NEUROLOGICAL BOWEL NUTRITION STATUS      Continent Diet (regular)  AMBULATORY STATUS COMMUNICATION OF NEEDS Skin   Extensive Assist Verbally  Normal                       Personal Care Assistance Level of Assistance  Bathing, Feeding, Dressing Bathing  Assistance: Limited assistance Feeding assistance: Limited assistance Dressing Assistance: Limited assistance     Functional Limitations Info  Sight, Hearing, Speech Sight Info: Adequate Hearing Info: Adequate Speech Info: Adequate    SPECIAL CARE FACTORS FREQUENCY  PT (By licensed PT), OT (By licensed OT)     PT Frequency: 5 x weekly OT Frequency: 5 x weekly            Contractures Contractures Info: Not present    Additional Factors Info  Code Status Code Status Info: DNR             Current Medications (12/03/2022):  This is the current hospital active medication list Current Facility-Administered Medications  Medication Dose Route Frequency Provider Last Rate Last Admin   0.9 %  sodium chloride infusion   Intravenous PRN Donne Hazel, MD   Stopped at 11/20/22 2044   acetaminophen (TYLENOL) tablet 650 mg  650 mg Oral Q6H PRN Reubin Milan, MD   650 mg at 11/28/22 2343   Or   acetaminophen (TYLENOL) suppository 650 mg  650 mg Rectal Q6H PRN Reubin Milan, MD       amoxicillin-clavulanate (AUGMENTIN) 875-125 MG per tablet 1 tablet  1 tablet Oral Q12H Lavina Hamman, MD   1 tablet at 12/02/22 2114   aspirin EC tablet 81 mg  81 mg Oral Daily Lavina Hamman, MD   81 mg at 12/02/22 1255   B-complex with vitamin C tablet 1 tablet  1 tablet Oral Daily Lavina Hamman, MD   1 tablet at 12/02/22 8786   carvedilol (COREG) tablet 3.125 mg  3.125 mg Oral BID WC Lavina Hamman, MD   3.125 mg at 12/02/22 1749   Chlorhexidine Gluconate Cloth 2 % PADS 6 each  6 each Topical Daily Donne Hazel, MD   6 each at 12/02/22 1256   doxycycline (VIBRA-TABS) tablet 100 mg  100 mg Oral Q12H Lavina Hamman, MD   100 mg at 12/02/22 2114   ferrous sulfate tablet 325 mg  325 mg Oral Q breakfast Lavina Hamman, MD   325 mg at 12/02/22 0927   fluconazole (DIFLUCAN) tablet 100 mg  100 mg Oral Daily Randa Spike, RPH   100 mg at 12/02/22 7672   furosemide (LASIX) tablet 20  mg  20 mg Oral Daily Lavina Hamman, MD   20 mg at 12/02/22 1543   haloperidol lactate (HALDOL) injection 2 mg  2 mg Intravenous Q6H PRN Lavina Hamman, MD       HYDROcodone-acetaminophen (NORCO/VICODIN) 5-325 MG per tablet 1 tablet  1 tablet Oral Q6H PRN Lavina Hamman, MD   1 tablet at 12/01/22 0031   insulin aspart (novoLOG) injection 0-15 Units  0-15 Units Subcutaneous TID WC Reubin Milan, MD   2 Units at 12/02/22 1255   insulin glargine-yfgn (SEMGLEE) injection 15 Units  15 Units Subcutaneous Daily Eugenie Filler, MD   15 Units at 12/02/22 0928   lidocaine (XYLOCAINE) 1 % (with pres) injection 5 mL  5 mL Infiltration Once Lavina Hamman, MD       mexiletine (MEXITIL) capsule 150 mg  150 mg Oral Q12H Freada Bergeron, MD   150 mg at 12/02/22 2114   ondansetron [facility]) tablet  4 mg  4 mg Oral Q6H PRN Reubin Milan, MD       Or   ondansetron [facility]) injection 4 mg  4 mg Intravenous Q6H PRN Reubin Milan, MD       Oral care mouth rinse  15 mL Mouth Rinse PRN Eugenie Filler, MD       oxybutynin [facility]) tablet 5 mg  5 mg Oral TID Eugenie Filler, MD   5 mg at 12/02/22 2114   phenol (CHLORASEPTIC) mouth spray 1 spray  1 spray Mouth/Throat PRN Lavina Hamman, MD   1 spray at 11/27/22 1724   polyethylene glycol (MIRALAX / GLYCOLAX) packet 17 g  17 g Oral Daily Terrilee Files, DO   17 g at 12/02/22 2115   protein supplement (ENSURE MAX) liquid  11 oz Oral Daily Eugenie Filler, MD   11 oz at 12/02/22 0928   rosuvastatin (CRESTOR) tablet 40 mg  40 mg Oral q AM Reubin Milan, MD   40 mg at 12/02/22 6168   senna (SENOKOT) tablet 8.6 mg  1 tablet Oral BID Terrilee Files, DO   8.6 mg at 12/02/22 2115   sodium chloride irrigation 0.9 % 3,000 mL  3,000 mL Irrigation Continuous Reubin Milan, MD   3,000 mL at 11/25/22 1216     Discharge Medications: Please see discharge summary for a list of discharge medications.  Relevant Imaging  Results:  Relevant Lab Results:   Additional Information SSN: [id number]  Leeroy Cha, RN

## 2022-12-04 DIAGNOSIS — Z515 Encounter for palliative care: Secondary | ICD-10-CM | POA: Diagnosis not present

## 2022-12-04 DIAGNOSIS — I2510 Atherosclerotic heart disease of native coronary artery without angina pectoris: Secondary | ICD-10-CM | POA: Diagnosis not present

## 2022-12-04 DIAGNOSIS — B3749 Other urogenital candidiasis: Secondary | ICD-10-CM | POA: Diagnosis not present

## 2022-12-04 DIAGNOSIS — D62 Acute posthemorrhagic anemia: Secondary | ICD-10-CM | POA: Diagnosis not present

## 2022-12-04 DIAGNOSIS — N139 Obstructive and reflux uropathy, unspecified: Secondary | ICD-10-CM | POA: Diagnosis not present

## 2022-12-04 LAB — BASIC METABOLIC PANEL
Anion gap: 8 (ref 5–15)
BUN: 14 mg/dL (ref 8–23)
CO2: 24 mmol/L (ref 22–32)
Calcium: 8.8 mg/dL — ABNORMAL LOW (ref 8.9–10.3)
Chloride: 108 mmol/L (ref 98–111)
Creatinine, Ser: 1.48 mg/dL — ABNORMAL HIGH (ref 0.61–1.24)
GFR, Estimated: 48 mL/min — ABNORMAL LOW (ref >60)
Glucose, Bld: 101 mg/dL — ABNORMAL HIGH (ref 70–99)
Potassium: 4.6 mmol/L (ref 3.5–5.1)
Sodium: 140 mmol/L (ref 135–145)

## 2022-12-04 LAB — GLUCOSE, CAPILLARY
Glucose-Capillary: 89 mg/dL (ref 70–99)
Glucose-Capillary: 113 mg/dL — ABNORMAL HIGH (ref 70–99)
Glucose-Capillary: 99 mg/dL (ref 70–99)
Glucose-Capillary: 103 mg/dL — ABNORMAL HIGH (ref 70–99)

## 2022-12-04 MED ORDER — SACCHAROMYCES BOULARDII 250 MG PO CAPS
250.0000 mg | ORAL_CAPSULE | Freq: Two times a day (BID) | ORAL | Status: DC
  Administered 2022-12-04 – 2022-12-08 (×8): 250 mg via ORAL
  Filled 2022-12-04 (×8): qty 1

## 2022-12-04 NOTE — Progress Notes (Signed)
[facility]  PROGRESS NOTE  Wayne [name] SNK:[id number] DOB: 21-Jul-1941 DOA: 11/16/2022 PCP: [name], MD   Brief HPI:   81 y.o. PMH of CAD SP PCI, PAF on Eliquis, HTN, chronic combined CHF, OSA, neuropathy, type II DM, PMR, prostate cancer SP brachytherapy, bladder cancer, AICD implant. Presented to hospital with complaints of hematuria and catheter dysfunction. Urology following.  Developed hospital induced delirium. 12/3 cystoscopy with clot evacuation and fulguration.  Initiated on CBI.  Started on Cipro. 12/5 CT-guided biopsy of left iliac and axillary lymph nodes. 12/8 started on fluconazole for yeast in urine 12/12 TURP, TURBT, bilateral pyelogram and ureteral stent placement.  Started on IV antibiotic for pneumonia 12/14 found to have left thumb paronychia doxycycline was added.  Underwent bedside debridement with hand surgery 12/15 biopsies came back positive for small cell metastatic prostate carcinoma.  Medical oncology was consulted.  Foley catheter was removed. 12/16 Foley catheter reinserted due to retention. 12/17 patient decided to transition to DNR/DNI from full code had frequent PVCs on telemetry.  Wound cultures came back positive from the thumb for Candida as well.  Workup for candidemia initiated.  Discussed with ID.  Recommend to continue oral fluconazole. 12/18 discussed with [name] from cardiology due to NSVT, initiated on mexiletine.  Palliative care consulted 12/19 echocardiogram shows evidence of aortic valve thickening.  Will require ID involvement based on goals of care conversation after [name] discussion with the family.      Subjective   Patient seen and examined, denies any complaints.   Assessment/Plan:     Gross hematuria. bladder cancer and prostate cancer. 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 Obstructive uropathy. Seen in ED by urology. Underwent cystoscopy on 12/3 and 12/12. Currently urine  appears to be clear. Treated with CBI. Patient had retention issues after removal of the Foley catheter on 12/15 and therefore Foley was inserted on 12/16 again.  Plan to discharge home with Foley catheter  Anticoagulation is permanently discontinued due to frequent hematuria on anticoagulation.   Candida UTI. Candida in left thumb cultures Aortic valve thickness. Concern for candidemia. Currently on fluconazole.   Initial plan was for 14-day therapy.  Will continue for longer duration for now. Anticoagulation had to change to Lovenox for that.  Now stopped. Patient's cultures from the left thumb also growing Candida.  There is a concern that the patient may actually have a candidemia. Blood cultures performed.  So far no growth. Aortic valve thickness seen on the echocardiogram.  Called and discussed with ID, Dr.Mayanka Candiss Norse, she recommends getting a TEE to rule out thrombus versus vegetation on the aortic valve.  -Cardiology consulted, plan for TEE on Tuesday  Hypokalemia -Replete   History of bladder cancer. Biopsy positive for metastatic small cell prostate cancer. Discussed with [name].  Will monitor recommendation. Otherwise management per urology.   lymphoma/SLL. Axillary lymph node biopsy positive for possible lymphoma/SLL. Hematology consulted with [name]. Will monitor recommendation   Left thumb paronychia Was on IV Zosyn and doxycycline.  Will switch to oral Augmentin. Follow-up on cultures.  Can narrow down antibiotic to Augmentin only. Highly appreciate hand surgery consultation.  Underwent bedside debridement. Continue daily dressing changes.   Hospital induced delirium. Resolved.  Continue as needed Haldol   ?  Aspiration pneumonia -Patient is already on IV Zosyn as above   AKI on CKD 3A. Carcinoma of the prostate with metastasis.  Renal function now stabilizing after decompression with Foley catheter. Monitor.  CAD. Stable. On statin.   Anticoagulation currently on hold.   Chronic combined CHF. Hypertension. Blood pressure soft. Currently on 3.125 mg Coreg and mexiletine.  -Started on Lasix 20 mg daily  -Not a candidate for [place] while on Diflucan. Blood pressure limiting other GDMT's.   History of V. tach storm. Frequent PVCs. NSVT. Patient does have history of significant cardiac arrhythmia and has an ICD. Patient was on amiodarone in the past which was discontinued for hyperthyroidism. Followed by this patient was on mexiletine which appears to be mistakenly patient has stopped taking since July 2023. Discussed with cardiology [name] on 12/18.  Initiated on mexiletine.  If the patient has significant NSVT burden still, they may consider initiation of amiodarone and performing a formal consult at that point.   Type II DM with hyperglycemia with long-term insulin use. Currently continuing basal bolus regimen. Hemoglobin A1c 11.2. Monitor.   Obesity Body mass index is 33.19 kg/m.  Placing the pt at higher risk of poor outcomes.   Goals of care conversation. [name] had extensive discussion with patient's family  with regards to his goals of care in presence of his son. Patient wants to have a natural course of death and wants to be DNR/DNI regardless of the outcome and understands it very well. Patient does have multiple comorbidities including chronic systolic CHF with a EF of 25% requiring an AICD with history of V. tach storm and CAD as well as CKD 3A. This is on top of patient's recent diagnosis of highly aggressive small cell prostate metastatic cancer. Consulted palliative care.    Lower extremity edema. Dopplers negative for DVT.   Deconditioning. PT OT reevaluation pending on 12/20.  Family prefers to take the patient home with home health.    Medications     amoxicillin-clavulanate  1 tablet Oral Q12H   aspirin EC  81 mg Oral Daily   B-complex with vitamin C  1 tablet Oral Daily    carvedilol  3.125 mg Oral BID WC   Chlorhexidine Gluconate Cloth  6 each Topical Daily   doxycycline  100 mg Oral Q12H   ferrous sulfate  325 mg Oral Q breakfast   fluconazole  100 mg Oral Daily   furosemide  20 mg Oral Daily   insulin aspart  0-6 Units Subcutaneous TID WC   insulin glargine-yfgn  15 Units Subcutaneous Daily   lidocaine  5 mL Infiltration Once   mexiletine  150 mg Oral Q12H   oxybutynin  5 mg Oral TID   polyethylene glycol  17 g Oral Daily   Ensure Max Protein  11 oz Oral Daily   rosuvastatin  40 mg Oral q AM   senna  1 tablet Oral BID     Data Reviewed:   CBG:  Recent Labs  Lab 12/03/22 0721 12/03/22 1145 12/03/22 1623 12/03/22 2043 12/04/22 0746  GLUCAP 94 119* 88 97 89    SpO2: 96 % O2 Flow Rate (L/min): 2 L/min    Vitals:   12/03/22 1757 12/03/22 2035 12/04/22 0554 12/04/22 0751  BP: (!) 96/50 (!) 101/52 (!) 98/58 100/60  Pulse: 69 73 73 70  Resp:  17 18   Temp:  98.5 F (36.9 C) 98 F (36.7 C)   TempSrc:  Oral Oral   SpO2:  98% 96%   Weight:      Height:          Data Reviewed:  Basic Metabolic Panel: Recent Labs  Lab 11/28/22 0502  11/29/22 0624 11/30/22 1045 12/01/22 1047 12/03/22 0457 12/04/22 0424  NA 139 139 140 140 141 140  K 3.7 3.6 4.2 3.8 3.0* 4.6  CL 107 107 109 110 109 108  CO2 '24 23 24 25 25 24  '$ GLUCOSE 138* 107* 107* 94 99 101*  BUN 29* 26* '21 20 16 14  '$ CREATININE 1.67* 1.51* 1.63* 1.52* 1.44* 1.48*  CALCIUM 8.8* 8.9 8.9 8.8* 8.2* 8.8*  MG 2.2 2.0 1.9  --  1.8  --   PHOS 2.0* 2.0*  --   --   --   --     CBC: Recent Labs  Lab 11/28/22 0502 11/29/22 0624 11/30/22 1045 12/01/22 1047 12/03/22 0457  WBC 8.8 8.8 9.6 9.3 7.0  NEUTROABS  --   --  5.9 5.7  --   HGB 8.9* 9.6* 9.1* 9.1* 8.7*  HCT 28.7* 30.7* 29.5* 29.2* 28.0*  MCV 89.4 89.2 91.0 90.4 89.7  PLT 238 256 239 235 219    LFT Recent Labs  Lab 11/28/22 0502 11/29/22 0624 11/30/22 1045  AST  --   --  24  ALT  --   --  18  ALKPHOS  --   --   54  BILITOT  --   --  0.4  PROT  --   --  5.4*  ALBUMIN 2.4* 2.6* 2.3*     Antibiotics: Anti-infectives (From admission, onward)    Start     Dose/Rate Route Frequency Ordered Stop   11/28/22 2000  amoxicillin-clavulanate (AUGMENTIN) 875-125 MG per tablet 1 tablet        1 tablet Oral Every 12 hours 11/28/22 1038     11/27/22 1515  doxycycline (VIBRA-TABS) tablet 100 mg        100 mg Oral Every 12 hours 11/27/22 1422     11/26/22 1000  fluconazole (DIFLUCAN) tablet 100 mg        100 mg Oral Daily 11/26/22 0843     11/26/22 1000  piperacillin-tazobactam (ZOSYN) IVPB 3.375 g  Status:  Discontinued        3.375 g 12.5 mL/hr over 240 Minutes Intravenous Every 8 hours 11/26/22 0853 11/28/22 1038   11/25/22 2000  ceFEPIme (MAXIPIME) 2 g in sodium chloride 0.9 % 100 mL IVPB  Status:  Discontinued        2 g 200 mL/hr over 30 Minutes Intravenous Every 12 hours 11/25/22 1620 11/26/22 0751   11/25/22 1330  Ampicillin-Sulbactam (UNASYN) 3 g in sodium chloride 0.9 % 100 mL IVPB        3 g 200 mL/hr over 30 Minutes Intravenous  Once 11/24/22 1659 11/25/22 1406   11/24/22 1000  fluconazole (DIFLUCAN) IVPB 100 mg  Status:  Discontinued        100 mg 50 mL/hr over 60 Minutes Intravenous Every 24 hours 11/24/22 0832 11/26/22 0843   11/23/22 1000  fluconazole (DIFLUCAN) tablet 200 mg  Status:  Discontinued        200 mg Oral Daily 11/22/22 1713 11/24/22 0832   11/21/22 1200  fluconazole (DIFLUCAN) IVPB 200 mg  Status:  Discontinued        200 mg 100 mL/hr over 60 Minutes Intravenous Every 24 hours 11/21/22 0909 11/22/22 1713   11/16/22 1300  ciprofloxacin (CIPRO) IVPB 400 mg        400 mg 200 mL/hr over 60 Minutes Intravenous Every 12 hours 11/16/22 1245 11/21/22 2233   11/16/22 1159  ciprofloxacin (CIPRO) 400 MG/200ML IVPB  Note to Pharmacy: Luane [employer] A: cabinet override      11/16/22 1159 11/16/22 1221        DVT prophylaxis: TED hose  Code Status: DNR  Family Communication:  Discussed with patient's wife at bedside   CONSULTS oncology, urology   Objective    Physical Examination:   General-appears in no acute distress Heart-S1-S2, regular, no murmur auscultated Lungs-clear to auscultation bilaterally, no wheezing or crackles auscultated Abdomen-soft, nontender, no organomegaly Extremities-no edema in the lower extremities Neuro-alert, oriented x3, no focal deficit noted   Status is: Inpatient:             Oswald Hillock   [facility] If 7PM-7AM, please contact night-coverage at [email or web address], Office  [phone]   12/04/2022, 10:16 AM  LOS: 17 days

## 2022-12-04 NOTE — Progress Notes (Signed)
Daily Progress Note   Patient Name: Wayne [name]       Date: 12/04/2022 DOB: Jan 20, 1941  Age: 81 y.o. MRN#: [id number] Attending Physician: Oswald Hillock, MD Primary Care Physician: [name], MD Admit Date: 11/16/2022 Length of Stay: 17 days  Reason for Consultation/Follow-up: Establishing goals of care and symptom management  Subjective:   CC: Following up with patient who is comfortable today laying in bed regarding complex medical decision making after discussions yesterday.  Subjective:  Reviewed EMR prior to seeing patient today.  Had reached out to oncology yesterday and [name] is planning to speak with patient and family after 4 PM today.  Presented to bedside prior to [name] arriving.  Patient denies any symptoms of concern at this time.  Again continue to discuss goals for medical care moving forward extensively.  During our conversation [name] was able to join and answer patient and family's questions regarding his aggressive prostate cancer.  Even in the best of situations, survival is not good.  With therapy patient may have a year and that it is if he could withstand that harsh chemotherapy needed.  Patient outright does not want to take chemotherapy and feel it is negative effects.  [name] patiently spent time answering all other questions to help illuminate pathway for care moving forward.  After discussions at length after [name] left, able to determine a plan moving forward.  Patient will go to rehab to regain his strength to enjoy more quality time at home.  Once patient is leaving rehab, patient will plan to go home with hospice support.  Patient will continue to focus on his comfort at home for the time he has after that.  Spent time answering all questions and addressing concerns regarding this.  At this time, patient and family do not want to continue with "aggressive" interventions.  Patient does not want to receive any more lab draws.   Patient willing to have his blood sugar checked once a day.  Will discontinue medications that do not focus on patient's comfort.  Will continue with oral antibiotics because these do improve patient's quality of life and hopefully allow more of that quality time.  Informed family this provider would not be present tomorrow and so they could reach out if acute palliative needs arise.  Updated primary hospitalist after visit today.  Review of Systems No symptoms of concern at this time. Objective:   Vital Signs:  BP 100/60   Pulse 70   Temp 98 F (36.7 C) (Oral)   Resp 18   Ht '5\' 8"'$  (1.727 m)   Wt 99 kg   SpO2 96%   BMI 33.19 kg/m   Physical Exam: General: NAD, alert, pleasant, laying in bed Eyes: No drainage noted HENT: moist mucous membranes Cardiovascular: RRR Respiratory: no increased work of breathing noted, not in respiratory distress Abdomen: not distended Extremities: Right hand in bandage Neuro: A&Ox4, following commands easily Psych: appropriately answers all questions  Imaging:  I personally reviewed recent imaging.   Assessment & Plan:   Assessment:  Patient is a 81yo M with a PMHx of CAD s/p PCI, PAF on Eliquis, hypertension, chronic combined CHF status post AICD implant, OSA, neuropathy, diabetes mellitus type 2, PMR, prostate cancer status post brachytherapy, and bladder cancer who was admitted on 11/16/2022 for management of hematuria and catheter dysfunction.  Urology has been following in the hospital.  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  Patient was seen by oncology while hospitalized due to new cancer diagnosis.  Palliative medicine consulted to assist with complex medical decision making.             Recommendations/Plan: #  Complex medical decision making/goals of care:  - Had extensive conversation with patient, his wife, and his daughter today.  [name] able to join in conversation and extremely grateful her his insights and answering family's concerns regarding patient's aggressive metastatic cancer.  At this time plan has been determined that patient will proceed with rehab placement in order to regain strength to enjoy quality time at home.  Patient will plan to go home from rehab with hospice support to have appropriate symptom management to allow more quality time at home.  -At this time, patient and family do not want to continue with "aggressive" interventions.  Patient does not want to receive any more lab draws.  Patient willing to have his blood sugar checked once a day.  Will discontinue medications that do not focus on patient's comfort.  Will continue with oral antibiotics because these do improve patient's quality of life and hopefully allow more of that quality time.  -  Code Status: DNR  -Patient is appropriate for hospice care with gnosis of 6 months or less in setting of decreased oral intake, decreased mobility, aggressive small cell cancer of the prostate, severe CAD, candidemia with possible valve vegetation, and multiple chronic comorbidities that based the patient at high risk for death.  # Symptom management:  --Constipation                               -Continue senna one 8.6 mg tablet twice daily                               -Scheduled MiraLAX 17 g daily.  # Psychosocial Support: -Support System: wife, daughter                -Provided copies of "Gone From My Sight" on 12/03/22  # Discharge Planning: rehab to regain strength to allow quality time at home  Discussed with: Bedside RN, hospitalist, oncologist, patient, wife  Thank you for allowing the palliative care team to participate in the care Wayne [name].  Chelsea Aus, DO Palliative Care Provider PMT # [phone]   This  provider spent a total of 65 minutes providing patient's care.  Includes review of EMR, discussing care with other staff members involved in patient's medical care, obtaining relevant history and information from patient and/or patient's family, and personal review of imaging and lab work. Greater than 50% of the time was spent counseling and coordinating care related to the above assessment and plan.

## 2022-12-04 NOTE — TOC Progression Note (Signed)
Transition of Care [facility]) - Progression Note    Patient Details  Name: RENSO SWETT MRN: [id number] Date of Birth: Jan 10, 1941  Transition of Care [facility]) CM/SW Contact  Sion Reinders, Juliann Pulse, RN Phone Number: 12/04/2022, 3:09 PM  Clinical Narrative:  left vm w/Edith to choose from bed offers;list left in rm-await choice prior auth.     Planned Disposition: Skilled Nursing Facility Barriers to Discharge: Continued Medical Work up  Expected Discharge Plan and Services   Discharge Planning Services: CM Consult   Living arrangements for the past 2 months: [place]: PT [place]: [place] Date [place]: 11/30/22 Time [place]: [place] Representative spoke with at [place]: [place] ([place]) Interventions [place]: No Food Insecurity (11/16/2022)  Housing: Low Risk  (11/16/2022)  Transportation Needs: No Transportation Needs (11/16/2022)  Utilities: Not At Risk (11/16/2022)  Depression (PHQ2-9): Low Risk  (09/17/2022)  Financial Resource Strain: Low Risk  (09/20/2018)  Tobacco Use: Medium Risk (11/26/2022)    Readmission Risk Interventions     No data to display

## 2022-12-05 DIAGNOSIS — D62 Acute posthemorrhagic anemia: Secondary | ICD-10-CM | POA: Diagnosis not present

## 2022-12-05 DIAGNOSIS — I2510 Atherosclerotic heart disease of native coronary artery without angina pectoris: Secondary | ICD-10-CM | POA: Diagnosis not present

## 2022-12-05 DIAGNOSIS — B3749 Other urogenital candidiasis: Secondary | ICD-10-CM | POA: Diagnosis not present

## 2022-12-05 DIAGNOSIS — N139 Obstructive and reflux uropathy, unspecified: Secondary | ICD-10-CM | POA: Diagnosis not present

## 2022-12-05 LAB — ANTIFUNGAL AST 9 DRUG PANEL
Amphotericin B MIC: 0.5
Fluconazole Islt MIC: 1
Flucytosine MIC: 0.12
Itraconazole MIC: 0.06
Posaconazole MIC: 0.06

## 2022-12-05 LAB — GLUCOSE, CAPILLARY
Glucose-Capillary: 99 mg/dL (ref 70–99)
Glucose-Capillary: 140 mg/dL — ABNORMAL HIGH (ref 70–99)
Glucose-Capillary: 101 mg/dL — ABNORMAL HIGH (ref 70–99)
Glucose-Capillary: 127 mg/dL — ABNORMAL HIGH (ref 70–99)

## 2022-12-05 MED ORDER — LACTATED RINGERS IV SOLN: INTRAVENOUS | Status: AC

## 2022-12-05 MED ORDER — LOPERAMIDE HCL 2 MG PO CAPS
2.0000 mg | ORAL_CAPSULE | Freq: Four times a day (QID) | ORAL | Status: DC | PRN
  Administered 2022-12-05: 2 mg via ORAL
  Filled 2022-12-05: qty 1

## 2022-12-05 MED ORDER — GERHARDT'S BUTT CREAM
TOPICAL_CREAM | Freq: Every day | CUTANEOUS | Status: DC
  Filled 2022-12-05: qty 1

## 2022-12-05 NOTE — Progress Notes (Signed)
[facility]  PROGRESS NOTE  Wayne [name]:[id number] DOB: 1941-09-20 DOA: 11/16/2022 PCP: [name], MD   Brief HPI:   81 y.o. PMH of CAD SP PCI, PAF on Eliquis, HTN, chronic combined CHF, OSA, neuropathy, type II DM, PMR, prostate cancer SP brachytherapy, bladder cancer, AICD implant. Presented to hospital with complaints of hematuria and catheter dysfunction. Urology following.  Developed hospital induced delirium. 12/3 cystoscopy with clot evacuation and fulguration.  Initiated on CBI.  Started on Cipro. 12/5 CT-guided biopsy of left iliac and axillary lymph nodes. 12/8 started on fluconazole for yeast in urine 12/12 TURP, TURBT, bilateral pyelogram and ureteral stent placement.  Started on IV antibiotic for pneumonia 12/14 found to have left thumb paronychia doxycycline was added.  Underwent bedside debridement with hand surgery 12/15 biopsies came back positive for small cell metastatic prostate carcinoma.  Medical oncology was consulted.  Foley catheter was removed. 12/16 Foley catheter reinserted due to retention. 12/17 patient decided to transition to DNR/DNI from full code had frequent PVCs on telemetry.  Wound cultures came back positive from the thumb for Candida as well.  Workup for candidemia initiated.  Discussed with ID.  Recommend to continue oral fluconazole. 12/18 discussed with [name] from cardiology due to NSVT, initiated on mexiletine.  Palliative care consulted 12/19 echocardiogram shows evidence of aortic valve thickening.  Will require ID involvement based on goals of care conversation after [name] discussion with the family.      Subjective   Patient seen, he has decided to go to skilled facility with hospice.  No aggressive treatment pursued.   Assessment/Plan:     Gross hematuria. bladder cancer and prostate cancer. 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 Obstructive uropathy. Seen in ED by urology.  Underwent cystoscopy on 12/3 and 12/12. Currently urine appears to be clear. Treated with CBI. Patient had retention issues after removal of the Foley catheter on 12/15 and therefore Foley was inserted on 12/16 again.  Plan to discharge home with Foley catheter  Anticoagulation is permanently discontinued due to frequent hematuria on anticoagulation.   Candida UTI. Candida in left thumb cultures Aortic valve thickness. Concern for candidemia. Currently on fluconazole.   Initial plan was for 14-day therapy.  Will continue for longer duration for now. Anticoagulation had to change to Lovenox for that.  Now stopped. Patient's cultures from the left thumb also growing Candida.  There is a concern that the patient may actually have a candidemia. Blood cultures performed.  So far no growth. Aortic valve thickness seen on the echocardiogram.  Called and discussed with ID, Dr.Mayanka Candiss Norse, she recommends getting a TEE to rule out thrombus versus vegetation on the aortic valve.  -Initially cardiology was consulted for possible TEE -Discussed with patient, he does not want to pursue TEE, patient has decided to go to rehab with hospice  -Will cancel TEE.  Hypokalemia -Replete   History of bladder cancer. Biopsy positive for metastatic small cell prostate cancer. Discussed with [name].  Will monitor recommendation. Otherwise management per urology.   lymphoma/SLL. Axillary lymph node biopsy positive for possible lymphoma/SLL. Hematology consulted with [name]. Will monitor recommendation   Left thumb paronychia Was on IV Zosyn and doxycycline.  Will switch to oral Augmentin. Follow-up on cultures.  Can narrow down antibiotic to Augmentin only. Highly appreciate hand surgery consultation.  Underwent bedside debridement. Continue daily dressing changes.   Hospital induced delirium. Resolved.  Continue as needed Haldol   ?  Aspiration  pneumonia -Patient is already on IV Zosyn as  above   AKI on CKD 3A. Carcinoma of the prostate with metastasis.  Renal function now stabilizing after decompression with Foley catheter. Monitor.   CAD. Stable. On statin.  Anticoagulation currently on hold.   Chronic combined CHF. Hypertension. Blood pressure soft. Currently on 3.125 mg Coreg and mexiletine.  -Started on Lasix 20 mg daily  -Not a candidate for [place] while on Diflucan. Blood pressure limiting other GDMT's.   History of V. tach storm. Frequent PVCs. NSVT. Patient does have history of significant cardiac arrhythmia and has an ICD. Patient was on amiodarone in the past which was discontinued for hyperthyroidism. Followed by this patient was on mexiletine which appears to be mistakenly patient has stopped taking since July 2023. Discussed with cardiology [name] on 12/18.  Initiated on mexiletine.  If the patient has significant NSVT burden still, they may consider initiation of amiodarone and performing a formal consult at that point.   Type II DM with hyperglycemia with long-term insulin use. Currently continuing basal bolus regimen. Hemoglobin A1c 11.2. Monitor.   Obesity Body mass index is 33.19 kg/m.  Placing the pt at higher risk of poor outcomes.   Goals of care conversation. [name] had extensive discussion with patient's family  with regards to his goals of care in presence of his son. Patient wants to have a natural course of death and wants to be DNR/DNI regardless of the outcome and understands it very well. Patient does have multiple comorbidities including chronic systolic CHF with a EF of 25% requiring an AICD with history of V. tach storm and CAD as well as CKD 3A. This is on top of patient's recent diagnosis of highly aggressive small cell prostate metastatic cancer. Consulted palliative care.    Lower extremity edema. Dopplers negative for DVT.   Deconditioning. PT OT reevaluation pending on 12/20.  Family prefers to take the  patient home with home health.    Medications     amoxicillin-clavulanate  1 tablet Oral Q12H   aspirin EC  81 mg Oral Daily   carvedilol  3.125 mg Oral BID WC   Chlorhexidine Gluconate Cloth  6 each Topical Daily   doxycycline  100 mg Oral Q12H   fluconazole  100 mg Oral Daily   furosemide  20 mg Oral Daily   insulin aspart  0-6 Units Subcutaneous TID WC   lidocaine  5 mL Infiltration Once   mexiletine  150 mg Oral Q12H   oxybutynin  5 mg Oral TID   polyethylene glycol  17 g Oral Daily   Ensure Max Protein  11 oz Oral Daily   saccharomyces boulardii  250 mg Oral BID   senna  1 tablet Oral BID     Data Reviewed:   CBG:  Recent Labs  Lab 12/04/22 0746 12/04/22 1142 12/04/22 1621 12/04/22 2026 12/05/22 0737  GLUCAP 89 113* 99 103* 99    SpO2: 99 % O2 Flow Rate (L/min): 2 L/min    Vitals:   12/04/22 0751 12/04/22 2030 12/05/22 0412 12/05/22 0844  BP: 100/60 (!) 98/55 (!) 96/52 (!) 98/56  Pulse: 70 73 79 75  Resp:  18 18   Temp:  98.5 F (36.9 C) 98 F (36.7 C)   TempSrc:  Oral    SpO2:  91% 99%   Weight:      Height:          Data Reviewed:  Basic Metabolic Panel: Recent Labs  Lab 11/29/22 0624 11/30/22 1045 12/01/22 1047 12/03/22 0457 12/04/22 0424  NA 139 140 140 141 140  K 3.6 4.2 3.8 3.0* 4.6  CL 107 109 110 109 108  CO2 '23 24 25 25 24  '$ GLUCOSE 107* 107* 94 99 101*  BUN 26* '21 20 16 14  '$ CREATININE 1.51* 1.63* 1.52* 1.44* 1.48*  CALCIUM 8.9 8.9 8.8* 8.2* 8.8*  MG 2.0 1.9  --  1.8  --   PHOS 2.0*  --   --   --   --     CBC: Recent Labs  Lab 11/29/22 0624 11/30/22 1045 12/01/22 1047 12/03/22 0457  WBC 8.8 9.6 9.3 7.0  NEUTROABS  --  5.9 5.7  --   HGB 9.6* 9.1* 9.1* 8.7*  HCT 30.7* 29.5* 29.2* 28.0*  MCV 89.2 91.0 90.4 89.7  PLT 256 239 235 219    LFT Recent Labs  Lab 11/29/22 0624 11/30/22 1045  AST  --  24  ALT  --  18  ALKPHOS  --  54  BILITOT  --  0.4  PROT  --  5.4*  ALBUMIN 2.6* 2.3*      Antibiotics: Anti-infectives (From admission, onward)    Start     Dose/Rate Route Frequency Ordered Stop   11/28/22 2000  amoxicillin-clavulanate (AUGMENTIN) 875-125 MG per tablet 1 tablet        1 tablet Oral Every 12 hours 11/28/22 1038     11/27/22 1515  doxycycline (VIBRA-TABS) tablet 100 mg        100 mg Oral Every 12 hours 11/27/22 1422     11/26/22 1000  fluconazole (DIFLUCAN) tablet 100 mg        100 mg Oral Daily 11/26/22 0843     11/26/22 1000  piperacillin-tazobactam (ZOSYN) IVPB 3.375 g  Status:  Discontinued        3.375 g 12.5 mL/hr over 240 Minutes Intravenous Every 8 hours 11/26/22 0853 11/28/22 1038   11/25/22 2000  ceFEPIme (MAXIPIME) 2 g in sodium chloride 0.9 % 100 mL IVPB  Status:  Discontinued        2 g 200 mL/hr over 30 Minutes Intravenous Every 12 hours 11/25/22 1620 11/26/22 0751   11/25/22 1330  Ampicillin-Sulbactam (UNASYN) 3 g in sodium chloride 0.9 % 100 mL IVPB        3 g 200 mL/hr over 30 Minutes Intravenous  Once 11/24/22 1659 11/25/22 1406   11/24/22 1000  fluconazole (DIFLUCAN) IVPB 100 mg  Status:  Discontinued        100 mg 50 mL/hr over 60 Minutes Intravenous Every 24 hours 11/24/22 0832 11/26/22 0843   11/23/22 1000  fluconazole (DIFLUCAN) tablet 200 mg  Status:  Discontinued        200 mg Oral Daily 11/22/22 1713 11/24/22 0832   11/21/22 1200  fluconazole (DIFLUCAN) IVPB 200 mg  Status:  Discontinued        200 mg 100 mL/hr over 60 Minutes Intravenous Every 24 hours 11/21/22 0909 11/22/22 1713   11/16/22 1300  ciprofloxacin (CIPRO) IVPB 400 mg        400 mg 200 mL/hr over 60 Minutes Intravenous Every 12 hours 11/16/22 1245 11/21/22 2233   11/16/22 1159  ciprofloxacin (CIPRO) 400 MG/200ML IVPB       Note to Pharmacy: [employer] A: cabinet override      11/16/22 1159 11/16/22 1221        DVT prophylaxis: TED hose  Code Status: DNR  Family  Communication: Discussed with patient's wife at bedside   CONSULTS oncology,  urology   Objective    Physical Examination:   Mouth appears dry Lungs clear to auscultation bilaterally Heart S1-S2, regular Extremities no edema   Status is: Inpatient:             Oswald Hillock   [facility] If 7PM-7AM, please contact night-coverage at [email or web address], Office  [phone]   12/05/2022, 10:27 AM  LOS: 18 days

## 2022-12-05 NOTE — Progress Notes (Signed)
Oncology Brief Progress Note  Today I spoke with Mr. Vester, his wife, and his daughter.  We discussed the findings from his biopsies.  We discussed the finding of CLL and his axillary lymph node as well as the concerning finding of small cell prostate cancer in the prostate and lymph nodes of the abdomen.  I discussed the poor prognosis of this disease and the chemotherapy regimen and would entail.  We discussed platinum based chemotherapy in combination with etoposide.  I noted that even with optimal treatment the prognosis is poor, with a likely life span of less than 12 months.  I also expressed my concern about his deconditioning and weakened state overall.  The patient voices understanding and noted that he felt too weak to consider chemotherapy.  He notes that he is agreeable to rehab but does not want to pursue chemotherapy at this time.  We discussed how the disease would likely progress and our role in the cancer moving forward.  He was agreeable to discharge to rehab with enrollment in hospice.  All questions and concerns were addressed.  [name], MD Department of Hematology/Oncology [place] [facility] Phone: [phone] Pager: [phone] Email: [email or web address]

## 2022-12-05 NOTE — Plan of Care (Signed)
  Problem: Fluid Volume: Goal: Ability to maintain a balanced intake and output will improve Outcome: Progressing   Problem: Health Behavior/Discharge Planning: Goal: Ability to manage health-related needs will improve Outcome: Progressing   Problem: Metabolic: Goal: Ability to maintain appropriate glucose levels will improve Outcome: Progressing   Problem: Nutritional: Goal: Maintenance of adequate nutrition will improve Outcome: Progressing

## 2022-12-05 NOTE — TOC Progression Note (Signed)
Transition of Care [facility]) - Progression Note    Patient Details  Name: Wayne [name] MRN: [id number] Date of Birth: November 12, 1941  [facility] CM/SW Contact  Lennart Pall, LCSW Phone Number: 12/05/2022, 4:29 PM  Clinical Narrative:     Met with pt and wife this afternoon and they have decided they no longer want to pursue SNF rehab and would like for pt to dc directly home with Hospice coverage.  They are requesting Hospice services with [facility] and referral has been completed.  Hospice will communicate with [facility] coverage tomorrow when referral is confirmed as accept and work to coordinate dc.  Have alerted MD to change in plan.   Expected Discharge Plan: [place] Barriers to Discharge: Continued Medical Work up  Expected Discharge Plan and Services   Discharge Planning Services: CM Consult   Living arrangements for the past 2 months: [place]: PT [place]: [place] Date [place]: 11/30/22 Time [place]: [place] Representative spoke with at [place]: [place] ([place]) Interventions [place]: No Food Insecurity (11/16/2022)  Housing: Low Risk  (11/16/2022)  Transportation Needs: No Transportation Needs (11/16/2022)  Utilities: Not At Risk (11/16/2022)  Depression (PHQ2-9): Low Risk  (09/17/2022)  Financial Resource Strain: Low Risk  (09/20/2018)  Tobacco Use: Medium Risk (11/26/2022)    Readmission Risk Interventions     No data to display

## 2022-12-05 NOTE — Progress Notes (Signed)
Physical Therapy Treatment Patient Details Name: Wayne [name] MRN: [id number] DOB: 11-08-1941 Today's Date: 12/05/2022   History of Present Illness Patient is 81 y.o. male who presented to hospital with complaints of hematuria and catheter dysfunction. Pt underwent cystoscopy on 62/9 with complications of recurrent clots in catheter, recurrent gross hematuria and increasing pelvic lymphadenopathy. Pt underwent TURP, TURBT, bilateral retrograde pyelogram, bilateral ureteral stent placement on 12/12. Biopsies revealed SLL/CLL in Lt axillary nodes, SCC in Lt iliac/pelvic nodes, and SCC of prostate and bladder. During stay pt also developed infection of Rt thumb and underwent bedside debridement by orthopedics on 12/14. PMH significant for CAD SP PCI, PAF on Eliquis, HTN, chronic combined CHF, OSA, neuropathy, type II DM, PMR, prostate cancer SP brachytherapy, bladder cancer, AICD implant.    PT Comments    The patient reports feeling better, patient does endorse diarrhea today.  Patient ambulated x 100' but was very fatigued  and  had to stop and rest x 2. Was getting very weak at room and recliner was pulled up to patient.  BP  133/75 HR 86, SPO2 100% RA.   Recommendations for follow up therapy are one component of a multi-disciplinary discharge planning process, led by the attending physician.  Recommendations may be updated based on patient status, additional functional criteria and insurance authorization.  Follow Up Recommendations  Skilled nursing-short term rehab (<3 hours/day) Can patient physically be transported by private vehicle: Maybe   Assistance Recommended at Discharge Frequent or constant Supervision/Assistance  Patient can return home with the following Assistance with cooking/housework;Direct supervision/assist for medications management;Assist for transportation;Help with stairs or ramp for entrance;A lot of help with walking and/or transfers;A lot of help with  bathing/dressing/bathroom   Equipment Recommendations  None recommended by PT    Recommendations for Other Services       Precautions / Restrictions Precautions Precautions: Fall Precaution Comments: gets weak with amb long distance, may need a brief, loose BM     Mobility  Bed Mobility         Supine to sit: Supervision          Transfers   Equipment used: Rolling walker (2 wheels) Transfers: Sit to/from Stand Sit to Stand: Min guard           General transfer comment: mildly shakey,when stands at [employer].    Ambulation/Gait Ambulation/Gait assistance: Min guard, Min assist Gait Distance (Feet): 100 Feet Assistive device: Rolling walker (2 wheels)   Gait velocity: decr     General Gait Details: patient stopped x 2 to lean on  rail, noted to be weaker the farther ambuklated, did make it back to room, recliner had to be brought closer as patient leaning in counter.   Stairs             Wheelchair Mobility    Modified Rankin (Stroke Patients Only)       Balance Overall balance assessment: Needs assistance Sitting-balance support: Feet supported Sitting balance-Leahy Scale: Fair     Standing balance support: Bilateral upper extremity supported, During functional activity, Reliant on assistive device for balance Standing balance-Leahy Scale: Poor                              Cognition Arousal/Alertness: Awake/alert Behavior During Therapy: WFL for tasks assessed/performed Overall Cognitive Status: Within Functional Limits for tasks assessed  Exercises      General Comments        Pertinent Vitals/Pain Pain Assessment Pain Assessment: No/denies pain    Home Living                          Prior Function            PT Goals (current goals can now be found in the care plan section) Progress towards PT goals: Progressing toward goals     Frequency    Min 2X/week      PT Plan Current plan remains appropriate    Co-evaluation              AM-PAC PT "6 Clicks" Mobility   Outcome Measure  Help needed turning from your back to your side while in a flat bed without using bedrails?: None Help needed moving from lying on your back to sitting on the side of a flat bed without using bedrails?: None Help needed moving to and from a bed to a chair (including a wheelchair)?: A Little Help needed standing up from a chair using your arms (e.g., wheelchair or bedside chair)?: A Little Help needed to walk in hospital room?: A Lot Help needed climbing 3-5 steps with a railing? : Total 6 Click Score: 17    End of Session Equipment Utilized During Treatment: Gait belt Activity Tolerance: Patient limited by fatigue Patient left: in chair;with call bell/phone within reach Nurse Communication: Mobility status PT Visit Diagnosis: Difficulty in walking, not elsewhere classified (R26.2);Muscle weakness (generalized) (M62.81)     Time: 4967-5916 PT Time Calculation (min) (ACUTE ONLY): 26 min  Charges:  $Gait Training: 23-37 mins                     [place] Office [phone] Weekend [phone]    Claretha Cooper 12/05/2022, 11:31 AM

## 2022-12-06 DIAGNOSIS — I2510 Atherosclerotic heart disease of native coronary artery without angina pectoris: Secondary | ICD-10-CM | POA: Diagnosis not present

## 2022-12-06 DIAGNOSIS — D62 Acute posthemorrhagic anemia: Secondary | ICD-10-CM | POA: Diagnosis not present

## 2022-12-06 DIAGNOSIS — B3749 Other urogenital candidiasis: Secondary | ICD-10-CM | POA: Diagnosis not present

## 2022-12-06 DIAGNOSIS — N139 Obstructive and reflux uropathy, unspecified: Secondary | ICD-10-CM | POA: Diagnosis not present

## 2022-12-06 LAB — GLUCOSE, CAPILLARY
Glucose-Capillary: 101 mg/dL — ABNORMAL HIGH (ref 70–99)
Glucose-Capillary: 147 mg/dL — ABNORMAL HIGH (ref 70–99)
Glucose-Capillary: 117 mg/dL — ABNORMAL HIGH (ref 70–99)
Glucose-Capillary: 117 mg/dL — ABNORMAL HIGH (ref 70–99)

## 2022-12-06 LAB — CULTURE, BLOOD (ROUTINE X 2)
Culture: NO GROWTH
Culture: NO GROWTH
Special Requests: ADEQUATE
Special Requests: ADEQUATE

## 2022-12-06 NOTE — Plan of Care (Signed)
  Problem: Activity: Goal: Risk for activity intolerance will decrease Outcome: Progressing   Problem: Coping: Goal: Level of anxiety will decrease Outcome: Progressing   Problem: Elimination: Goal: Will not experience complications related to bowel motility Outcome: Progressing   Problem: Pain Managment: Goal: General experience of comfort will improve Outcome: Progressing   Problem: Safety: Goal: Ability to remain free from injury will improve Outcome: Progressing   Problem: Skin Integrity: Goal: Risk for impaired skin integrity will decrease Outcome: Progressing   Problem: Nutrition: Goal: Adequate nutrition will be maintained Outcome: Adequate for Discharge   Problem: Elimination: Goal: Will not experience complications related to urinary retention Outcome: Adequate for Discharge

## 2022-12-06 NOTE — Plan of Care (Signed)
  Problem: Education: Goal: Ability to describe self-care measures that may prevent or decrease complications (Diabetes Survival Skills Education) will improve Outcome: Progressing Goal: Individualized Educational Video(s) Outcome: Progressing   Problem: Coping: Goal: Ability to adjust to condition or change in health will improve Outcome: Progressing   Problem: Fluid Volume: Goal: Ability to maintain a balanced intake and output will improve Outcome: Progressing   Problem: Health Behavior/Discharge Planning: Goal: Ability to identify and utilize available resources and services will improve Outcome: Progressing Goal: Ability to manage health-related needs will improve Outcome: Progressing   Problem: Metabolic: Goal: Ability to maintain appropriate glucose levels will improve Outcome: Progressing   Problem: Nutritional: Goal: Maintenance of adequate nutrition will improve Outcome: Progressing Goal: Progress toward achieving an optimal weight will improve Outcome: Progressing   Problem: Skin Integrity: Goal: Risk for impaired skin integrity will decrease Outcome: Progressing   Problem: Tissue Perfusion: Goal: Adequacy of tissue perfusion will improve Outcome: Progressing   Problem: Health Behavior/Discharge Planning: Goal: Ability to manage health-related needs will improve Outcome: Progressing   Problem: Clinical Measurements: Goal: Ability to maintain clinical measurements within normal limits will improve Outcome: Progressing Goal: Will remain free from infection Outcome: Progressing Goal: Diagnostic test results will improve Outcome: Progressing Goal: Respiratory complications will improve Outcome: Progressing Goal: Cardiovascular complication will be avoided Outcome: Progressing   Problem: Activity: Goal: Risk for activity intolerance will decrease Outcome: Progressing   Problem: Coping: Goal: Level of anxiety will decrease Outcome: Progressing    Problem: Elimination: Goal: Will not experience complications related to bowel motility Outcome: Progressing Goal: Will not experience complications related to urinary retention Outcome: Progressing   Problem: Pain Managment: Goal: General experience of comfort will improve Outcome: Progressing   Problem: Safety: Goal: Ability to remain free from injury will improve Outcome: Progressing   Problem: Education: Goal: Knowledge of General Education information will improve Description: Including pain rating scale, medication(s)/side effects and non-pharmacologic comfort measures Outcome: Progressing

## 2022-12-06 NOTE — Progress Notes (Signed)
Mobility Specialist - Progress Note   12/06/22 1436  Mobility  Activity Ambulated with assistance in hallway  Level of Assistance Minimal assist, patient does 75% or more  Assistive Device Front wheel walker  Distance Ambulated (ft) 80 ft  Range of Motion/Exercises Active  Activity Response Tolerated well  Mobility Referral Yes  $Mobility charge 1 Mobility   Pt was found in bed and agreeable to ambulate.Was min-A to go from lying to sitting EOB and contact guard for ambulation due to being a little shaky.  Had  x1 standing rest break for ~ 1 min towards EOS due to fatigue. Returned to [employer] with necessities in reach and RN notified of session.  Ferd Hibbs Mobility Specialist

## 2022-12-06 NOTE — TOC Progression Note (Addendum)
Transition of Care [facility]) - Progression Note    Patient Details  Name: Wayne [name] MRN: [id number] Date of Birth: 1941/10/18  Transition of Care [facility]) CM/SW Contact  Henrietta Dine, RN Phone Number: [phone] 12/06/2022, 11:14 AM  Clinical Narrative:    Richardo Hanks at [place] [phone]) she says she is about to call the pt's wife; she will call back and update TOC; awaiting return call.  -[place] from [place] called back and says she has spoken w/ pt's wife, and the pt's info has been sent for approval; Benjamine Mola also says she will notify [facility] w/ approval status.  -1252- notified pt approved for home hospice w/ earliest start of care date of 12/07/22; contacted pt's wife Semisi Biela ([phone]); she says she was told that the pt wouldn't d/c home until Monday or Tuesday; Dr Darrick Meigs notified.  Expected Discharge Plan: [place] Barriers to Discharge: Continued Medical Work up  Expected Discharge Plan and Services   Discharge Planning Services: CM Consult   Living arrangements for the past 2 months: [place]: PT [place]: [place] Date [place]: 11/30/22 Time [place]: [place] Representative spoke with at [place]: [place] ([place]) Interventions [place]: No Food Insecurity (11/16/2022)  Housing: Low Risk  (11/16/2022)  Transportation Needs: No Transportation Needs (11/16/2022)  Utilities: Not At Risk (11/16/2022)  Depression (PHQ2-9): Low Risk  (09/17/2022)  Financial Resource Strain: Low Risk  (09/20/2018)  Tobacco Use: Medium Risk (11/26/2022)    Readmission Risk Interventions     No data to display

## 2022-12-06 NOTE — Progress Notes (Signed)
   12/06/22 1656  Vitals  BP (!) 90/56  MAP (mmHg) 65  BP Location Left Arm  BP Method Automatic  Patient Position (if appropriate) Lying  Pulse Rate 73  MEWS COLOR  MEWS Score Color Green  MEWS Score  MEWS Temp 0  MEWS Systolic 1  MEWS Pulse 0  MEWS RR 0  MEWS LOC 0  MEWS Score 1  Provider Notification  Provider Name/Title Dr. Darrick Meigs  Date Provider Notified 12/06/22  Time Provider Notified 9242  Method of Notification Page  Notification Reason Other (Comment) (held Coreg twice this shift)

## 2022-12-06 NOTE — Progress Notes (Addendum)
[facility]  PROGRESS NOTE  Wayne [name] TIR:[id number] DOB: [birth year] DOA: 11/16/2022 PCP: [name], MD   Brief HPI:   81 y.o. PMH of CAD SP PCI, PAF on Eliquis, HTN, chronic combined CHF, OSA, neuropathy, type II DM, PMR, prostate cancer SP brachytherapy, bladder cancer, AICD implant. Presented to hospital with complaints of hematuria and catheter dysfunction. Urology following.  Developed hospital induced delirium. 12/3 cystoscopy with clot evacuation and fulguration.  Initiated on CBI.  Started on Cipro. 12/5 CT-guided biopsy of left iliac and axillary lymph nodes. 12/8 started on fluconazole for yeast in urine 12/12 TURP, TURBT, bilateral pyelogram and ureteral stent placement.  Started on IV antibiotic for pneumonia 12/14 found to have left thumb paronychia doxycycline was added.  Underwent bedside debridement with hand surgery 12/15 biopsies came back positive for small cell metastatic prostate carcinoma.  Medical oncology was consulted.  Foley catheter was removed. 12/16 Foley catheter reinserted due to retention. 12/17 patient decided to transition to DNR/DNI from full code had frequent PVCs on telemetry.  Wound cultures came back positive from the thumb for Candida as well.  Workup for candidemia initiated.  Discussed with ID.  Recommend to continue oral fluconazole. 12/18 discussed with [name] from cardiology due to NSVT, initiated on mexiletine.  Palliative care consulted 12/19 echocardiogram shows evidence of aortic valve thickening.  Will require ID involvement based on goals of care conversation after [name] discussion with the family.      Subjective   Patient seen and examined, no new complaints.  Has decided to go home with hospice.  [place] has been consulted   Assessment/Plan:     Gross hematuria. bladder cancer and prostate cancer. 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 Obstructive uropathy. Seen in ED  by urology. Underwent cystoscopy on 12/3 and 12/12. Currently urine appears to be clear. Treated with CBI. Patient had retention issues after removal of the Foley catheter on 12/15 and therefore Foley was inserted on 12/16 again.  Plan to discharge home with Foley catheter  Anticoagulation is permanently discontinued due to frequent hematuria on anticoagulation.   Candida UTI. Candida in left thumb cultures Aortic valve thickness. Concern for candidemia. Currently on fluconazole.   Initial plan was for 14-day therapy.  Will continue for longer duration for now. Anticoagulation had to change to Lovenox for that.  Now stopped. Patient's cultures from the left thumb also growing Candida.  There is a concern that the patient may actually have a candidemia. Blood cultures performed.  So far no growth. Aortic valve thickness seen on the echocardiogram.  Called and discussed with ID, Dr.Mayanka Candiss Norse, she recommends getting a TEE to rule out thrombus versus vegetation on the aortic valve.  -Initially cardiology was consulted for possible TEE -Discussed with patient, he does not want to pursue TEE, patient has decided to go to rehab with hospice  -Will cancel TEE.  Hypokalemia -Replete   History of bladder cancer. Biopsy positive for metastatic small cell prostate cancer. Discussed with [name].  Will monitor recommendation. Otherwise management per urology.   lymphoma/SLL. Axillary lymph node biopsy positive for possible lymphoma/SLL. Hematology consulted with [name]. Will monitor recommendation   Left thumb paronychia Was on IV Zosyn and doxycycline.  Will switch to oral Augmentin. Follow-up on cultures.  Can narrow down antibiotic to Augmentin only. Highly appreciate hand surgery consultation.  Underwent bedside debridement. Continue daily dressing changes.   Hospital induced delirium. Resolved.  Continue as needed Haldol   ?  Aspiration pneumonia -Patient is already on IV  Zosyn as above   AKI on CKD 3A. Carcinoma of the prostate with metastasis.  Renal function now stabilizing after decompression with Foley catheter. Monitor.   CAD. Stable. On statin.  Anticoagulation currently on hold.   Chronic combined CHF. Hypertension. Blood pressure soft. Currently on 3.125 mg Coreg and mexiletine.  -Started on Lasix 20 mg daily  -Not a candidate for [place] while on Diflucan. Blood pressure limiting other GDMT's.   History of V. tach storm. Frequent PVCs. NSVT. Patient does have history of significant cardiac arrhythmia and has an ICD. Patient was on amiodarone in the past which was discontinued for hyperthyroidism. Followed by this patient was on mexiletine which appears to be mistakenly patient has stopped taking since July 2023. Discussed with cardiology [name] on 12/18.  Initiated on mexiletine.  If the patient has significant NSVT burden still, they may consider initiation of amiodarone and performing a formal consult at that point.   Type II DM with hyperglycemia with long-term insulin use. Currently continuing basal bolus regimen. Hemoglobin A1c 11.2. Monitor.   Obesity Body mass index is 33.19 kg/m.  Placing the pt at higher risk of poor outcomes.   Goals of care conversation. [name] had extensive discussion with patient's family  with regards to his goals of care in presence of his son. Patient wants to have a natural course of death and wants to be DNR/DNI regardless of the outcome and understands it very well. Patient does have multiple comorbidities including chronic systolic CHF with a EF of 25% requiring an AICD with history of V. tach storm and CAD as well as CKD 3A. This is on top of patient's recent diagnosis of highly aggressive small cell prostate metastatic cancer. Consulted palliative care.  Patient has decided to go home with hospice.  No aggressive interventions pursued at this time.  [place] has been  consulted.  Likely discharge in a.m.   Lower extremity edema. Dopplers negative for DVT.   Deconditioning. PT OT reevaluation pending on 12/20.  Family prefers to take the patient home with hospice    Medications     amoxicillin-clavulanate  1 tablet Oral Q12H   aspirin EC  81 mg Oral Daily   carvedilol  3.125 mg Oral BID WC   Chlorhexidine Gluconate Cloth  6 each Topical Daily   doxycycline  100 mg Oral Q12H   fluconazole  100 mg Oral Daily   furosemide  20 mg Oral Daily   Gerhardt's butt cream   Topical Daily   insulin aspart  0-6 Units Subcutaneous TID WC   lidocaine  5 mL Infiltration Once   mexiletine  150 mg Oral Q12H   oxybutynin  5 mg Oral TID   polyethylene glycol  17 g Oral Daily   Ensure Max Protein  11 oz Oral Daily   saccharomyces boulardii  250 mg Oral BID   senna  1 tablet Oral BID     Data Reviewed:   CBG:  Recent Labs  Lab 12/05/22 1215 12/05/22 1707 12/05/22 2043 12/06/22 0732 12/06/22 1224  GLUCAP 140* 101* 127* 101* 147*    SpO2: 100 % O2 Flow Rate (L/min): 2 L/min    Vitals:   12/05/22 1218 12/05/22 2050 12/06/22 0600 12/06/22 0923  BP: (!) 93/49 98/64 (!) 110/52 (!) 94/52  Pulse: 81 73 75 75  Resp: '18 19 18   '$ Temp: 98.4 F (36.9 C) 98.1 F (36.7 C) 98.2 F (36.8  C)   TempSrc: Oral Oral Oral   SpO2: 97% 94% 93% 100%  Weight:      Height:          Data Reviewed:  Basic Metabolic Panel: Recent Labs  Lab 11/30/22 1045 12/01/22 1047 12/03/22 0457 12/04/22 0424  NA 140 140 141 140  K 4.2 3.8 3.0* 4.6  CL 109 110 109 108  CO2 '24 25 25 24  '$ GLUCOSE 107* 94 99 101*  BUN '21 20 16 14  '$ CREATININE 1.63* 1.52* 1.44* 1.48*  CALCIUM 8.9 8.8* 8.2* 8.8*  MG 1.9  --  1.8  --     CBC: Recent Labs  Lab 11/30/22 1045 12/01/22 1047 12/03/22 0457  WBC 9.6 9.3 7.0  NEUTROABS 5.9 5.7  --   HGB 9.1* 9.1* 8.7*  HCT 29.5* 29.2* 28.0*  MCV 91.0 90.4 89.7  PLT 239 235 219    LFT Recent Labs  Lab 11/30/22 1045  AST 24  ALT  18  ALKPHOS 54  BILITOT 0.4  PROT 5.4*  ALBUMIN 2.3*     Antibiotics: Anti-infectives (From admission, onward)    Start     Dose/Rate Route Frequency Ordered Stop   11/28/22 2000  amoxicillin-clavulanate (AUGMENTIN) 875-125 MG per tablet 1 tablet        1 tablet Oral Every 12 hours 11/28/22 1038     11/27/22 1515  doxycycline (VIBRA-TABS) tablet 100 mg        100 mg Oral Every 12 hours 11/27/22 1422     11/26/22 1000  fluconazole (DIFLUCAN) tablet 100 mg        100 mg Oral Daily 11/26/22 0843     11/26/22 1000  piperacillin-tazobactam (ZOSYN) IVPB 3.375 g  Status:  Discontinued        3.375 g 12.5 mL/hr over 240 Minutes Intravenous Every 8 hours 11/26/22 0853 11/28/22 1038   11/25/22 2000  ceFEPIme (MAXIPIME) 2 g in sodium chloride 0.9 % 100 mL IVPB  Status:  Discontinued        2 g 200 mL/hr over 30 Minutes Intravenous Every 12 hours 11/25/22 1620 11/26/22 0751   11/25/22 1330  Ampicillin-Sulbactam (UNASYN) 3 g in sodium chloride 0.9 % 100 mL IVPB        3 g 200 mL/hr over 30 Minutes Intravenous  Once 11/24/22 1659 11/25/22 1406   11/24/22 1000  fluconazole (DIFLUCAN) IVPB 100 mg  Status:  Discontinued        100 mg 50 mL/hr over 60 Minutes Intravenous Every 24 hours 11/24/22 0832 11/26/22 0843   11/23/22 1000  fluconazole (DIFLUCAN) tablet 200 mg  Status:  Discontinued        200 mg Oral Daily 11/22/22 1713 11/24/22 0832   11/21/22 1200  fluconazole (DIFLUCAN) IVPB 200 mg  Status:  Discontinued        200 mg 100 mL/hr over 60 Minutes Intravenous Every 24 hours 11/21/22 0909 11/22/22 1713   11/16/22 1300  ciprofloxacin (CIPRO) IVPB 400 mg        400 mg 200 mL/hr over 60 Minutes Intravenous Every 12 hours 11/16/22 1245 11/21/22 2233   11/16/22 1159  ciprofloxacin (CIPRO) 400 MG/200ML IVPB       Note to Pharmacy: [employer] A: cabinet override      11/16/22 1159 11/16/22 1221        DVT prophylaxis: TED hose  Code Status: DNR  Family Communication: Discussed with  patient's wife at bedside   CONSULTS oncology, urology  Objective    Physical Examination:   Appears in no acute distress S1-S2, regular, no murmur auscultated Abdomen is soft, nontender No edema in the lower extremities   Status is: Inpatient:             Oswald Hillock   [facility] If 7PM-7AM, please contact night-coverage at [email or web address], Office  [phone]   12/06/2022, 1:21 PM  LOS: 19 days

## 2022-12-07 DIAGNOSIS — N139 Obstructive and reflux uropathy, unspecified: Secondary | ICD-10-CM | POA: Diagnosis not present

## 2022-12-07 DIAGNOSIS — B3749 Other urogenital candidiasis: Secondary | ICD-10-CM | POA: Diagnosis not present

## 2022-12-07 DIAGNOSIS — I2510 Atherosclerotic heart disease of native coronary artery without angina pectoris: Secondary | ICD-10-CM | POA: Diagnosis not present

## 2022-12-07 DIAGNOSIS — D62 Acute posthemorrhagic anemia: Secondary | ICD-10-CM | POA: Diagnosis not present

## 2022-12-07 DIAGNOSIS — R42 Dizziness and giddiness: Secondary | ICD-10-CM

## 2022-12-07 LAB — GLUCOSE, CAPILLARY
Glucose-Capillary: 118 mg/dL — ABNORMAL HIGH (ref 70–99)
Glucose-Capillary: 143 mg/dL — ABNORMAL HIGH (ref 70–99)
Glucose-Capillary: 136 mg/dL — ABNORMAL HIGH (ref 70–99)
Glucose-Capillary: 141 mg/dL — ABNORMAL HIGH (ref 70–99)
Glucose-Capillary: 121 mg/dL — ABNORMAL HIGH (ref 70–99)

## 2022-12-07 MED ORDER — LACTATED RINGERS IV SOLN: INTRAVENOUS | Status: AC

## 2022-12-07 NOTE — Plan of Care (Signed)
  Problem: Coping: Goal: Ability to adjust to condition or change in health will improve Outcome: Progressing   Problem: Health Behavior/Discharge Planning: Goal: Ability to manage health-related needs will improve Outcome: Progressing   Problem: Clinical Measurements: Goal: Will remain free from infection Outcome: Progressing   Problem: Safety: Goal: Ability to remain free from injury will improve Outcome: Progressing   Problem: Pain Managment: Goal: General experience of comfort will improve Outcome: Progressing   Problem: Safety: Goal: Ability to remain free from injury will improve Outcome: Progressing

## 2022-12-07 NOTE — Progress Notes (Signed)
   12/07/22 1047  Provider Notification  Provider Name/Title Dr. Darrick Meigs  Date Provider Notified 12/07/22  Time Provider Notified 1017  Method of Notification Page  Notification Reason Change in status;Other (Comment) (low BP)  Provider response See new orders  Date of Provider Response 12/07/22  Time of Provider Response 1017   Pt reports feeling dizzy lying in bed. Orthostatics completed. See flowsheets for BP lying, sitting and standing. Pt clammy to touch. Reports feeling weaker today. CBG normal. Dr. Darrick Meigs made aware. Orders to hold Lasix and start IVF. Pt updated on plan of care. Verbalized understanding.

## 2022-12-07 NOTE — Plan of Care (Signed)
  Problem: Coping: Goal: Ability to adjust to condition or change in health will improve Outcome: Adequate for Discharge   Problem: Fluid Volume: Goal: Ability to maintain a balanced intake and output will improve Outcome: Adequate for Discharge   Problem: Health Behavior/Discharge Planning: Goal: Ability to identify and utilize available resources and services will improve Outcome: Adequate for Discharge Goal: Ability to manage health-related needs will improve Outcome: Adequate for Discharge   Problem: Metabolic: Goal: Ability to maintain appropriate glucose levels will improve Outcome: Adequate for Discharge   Problem: Nutritional: Goal: Maintenance of adequate nutrition will improve Outcome: Adequate for Discharge Goal: Progress toward achieving an optimal weight will improve Outcome: Adequate for Discharge   Problem: Skin Integrity: Goal: Risk for impaired skin integrity will decrease Outcome: Adequate for Discharge   Problem: Tissue Perfusion: Goal: Adequacy of tissue perfusion will improve Outcome: Adequate for Discharge   Problem: Health Behavior/Discharge Planning: Goal: Ability to manage health-related needs will improve Outcome: Adequate for Discharge   Problem: Clinical Measurements: Goal: Ability to maintain clinical measurements within normal limits will improve Outcome: Adequate for Discharge Goal: Will remain free from infection Outcome: Adequate for Discharge Goal: Diagnostic test results will improve Outcome: Adequate for Discharge Goal: Respiratory complications will improve Outcome: Adequate for Discharge Goal: Cardiovascular complication will be avoided Outcome: Adequate for Discharge   Problem: Activity: Goal: Risk for activity intolerance will decrease Outcome: Adequate for Discharge   Problem: Coping: Goal: Level of anxiety will decrease Outcome: Adequate for Discharge   Problem: Elimination: Goal: Will not experience complications  related to bowel motility Outcome: Adequate for Discharge Goal: Will not experience complications related to urinary retention Outcome: Adequate for Discharge   Problem: Pain Managment: Goal: General experience of comfort will improve Outcome: Adequate for Discharge   Problem: Safety: Goal: Ability to remain free from injury will improve Outcome: Adequate for Discharge   Problem: Skin Integrity: Goal: Risk for impaired skin integrity will decrease Outcome: Adequate for Discharge   Problem: Education: Goal: Knowledge of General Education information will improve Description: Including pain rating scale, medication(s)/side effects and non-pharmacologic comfort measures Outcome: Adequate for Discharge   Problem: Health Behavior/Discharge Planning: Goal: Ability to manage health-related needs will improve Outcome: Adequate for Discharge   Problem: Clinical Measurements: Goal: Ability to maintain clinical measurements within normal limits will improve Outcome: Adequate for Discharge Goal: Will remain free from infection Outcome: Adequate for Discharge Goal: Diagnostic test results will improve Outcome: Adequate for Discharge Goal: Respiratory complications will improve Outcome: Adequate for Discharge Goal: Cardiovascular complication will be avoided Outcome: Adequate for Discharge   Problem: Activity: Goal: Risk for activity intolerance will decrease Outcome: Adequate for Discharge   Problem: Nutrition: Goal: Adequate nutrition will be maintained Outcome: Adequate for Discharge   Problem: Coping: Goal: Level of anxiety will decrease Outcome: Adequate for Discharge   Problem: Elimination: Goal: Will not experience complications related to bowel motility Outcome: Adequate for Discharge Goal: Will not experience complications related to urinary retention Outcome: Adequate for Discharge   Problem: Pain Managment: Goal: General experience of comfort will  improve Outcome: Adequate for Discharge   Problem: Safety: Goal: Ability to remain free from injury will improve Outcome: Adequate for Discharge   Problem: Skin Integrity: Goal: Risk for impaired skin integrity will decrease Outcome: Adequate for Discharge   Problem: Education: Goal: Individualized Educational Video(s) Outcome: Not Applicable

## 2022-12-07 NOTE — Progress Notes (Signed)
[facility]  PROGRESS NOTE  BRODIN GELPI AVW:[id number] DOB: [birth year] DOA: 11/16/2022 PCP: [name], MD   Brief HPI:   81 y.o. PMH of CAD SP PCI, PAF on Eliquis, HTN, chronic combined CHF, OSA, neuropathy, type II DM, PMR, prostate cancer SP brachytherapy, bladder cancer, AICD implant. Presented to hospital with complaints of hematuria and catheter dysfunction. Urology following.  Developed hospital induced delirium. 12/3 cystoscopy with clot evacuation and fulguration.  Initiated on CBI.  Started on Cipro. 12/5 CT-guided biopsy of left iliac and axillary lymph nodes. 12/8 started on fluconazole for yeast in urine 12/12 TURP, TURBT, bilateral pyelogram and ureteral stent placement.  Started on IV antibiotic for pneumonia 12/14 found to have left thumb paronychia doxycycline was added.  Underwent bedside debridement with hand surgery 12/15 biopsies came back positive for small cell metastatic prostate carcinoma.  Medical oncology was consulted.  Foley catheter was removed. 12/16 Foley catheter reinserted due to retention. 12/17 patient decided to transition to DNR/DNI from full code had frequent PVCs on telemetry.  Wound cultures came back positive from the thumb for Candida as well.  Workup for candidemia initiated.  Discussed with ID.  Recommend to continue oral fluconazole. 12/18 discussed with [name] from cardiology due to NSVT, initiated on mexiletine.  Palliative care consulted 12/19 echocardiogram shows evidence of aortic valve thickening.  Will require ID involvement based on goals of care conversation after [name] discussion with the family.      Subjective   This morning patient was feeling dizzy, orthostatic vital signs were positive.  Started on IV LR at 100 mm/h for 12 hours.  Lasix and Coreg were held.   Assessment/Plan:     Gross hematuria. bladder cancer and prostate cancer. 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 Obstructive uropathy. Seen in ED by urology. Underwent cystoscopy on 12/3 and 12/12. Currently urine appears to be clear. Treated with CBI. Patient had retention issues after removal of the Foley catheter on 12/15 and therefore Foley was inserted on 12/16 again.  Plan to discharge home with Foley catheter  Anticoagulation is permanently discontinued due to frequent hematuria on anticoagulation.  Postural hypotension -Patient was dizzy this morning, positive orthostatic vital signs -Will discontinue Coreg, Lasix -Start LR at 100 ml/h for 12 hours   Candida UTI. Candida in left thumb cultures Aortic valve thickness. Concern for candidemia. Currently on fluconazole.   Initial plan was for 14-day therapy.  Will continue for longer duration for now. Anticoagulation had to change to Lovenox for that.  Now stopped. Patient's cultures from the left thumb also growing Candida.  There is a concern that the patient may actually have a candidemia. Blood cultures performed.  So far no growth. Aortic valve thickness seen on the echocardiogram.  Called and discussed with ID, Dr.Mayanka Candiss Norse, she recommends getting a TEE to rule out thrombus versus vegetation on the aortic valve.  -Initially cardiology was consulted for possible TEE -Discussed with patient, he does not want to pursue TEE, patient has decided to go to rehab with hospice  -Will cancel TEE. -Will discontinue doxycycline, Augmentin -Continue Diflucan  Hypokalemia -Replete   History of bladder cancer. Biopsy positive for metastatic small cell prostate cancer. Discussed with [name].  Will monitor recommendation. Otherwise management per urology.   lymphoma/SLL. Axillary lymph node biopsy positive for possible lymphoma/SLL. Hematology consulted with [name]. Will monitor recommendation   Left thumb paronychia Was on IV Zosyn and doxycycline.  Will switch to oral  Augmentin. Follow-up on cultures.  Can narrow  down antibiotic to Augmentin only. Highly appreciate hand surgery consultation.  Underwent bedside debridement. Continue daily dressing changes.   Hospital induced delirium. Resolved.  Continue as needed Haldol   ?  Aspiration pneumonia -Patient is already on IV Zosyn as above   AKI on CKD 3A. Carcinoma of the prostate with metastasis.  Renal function now stabilizing after decompression with Foley catheter. Monitor.   CAD. Stable. On statin.  Anticoagulation currently on hold.   Chronic combined CHF. Hypertension. Blood pressure soft. Currently on 3.125 mg Coreg and mexiletine.  -Started on Lasix 20 mg daily  -Not a candidate for [place] while on Diflucan. Blood pressure limiting other GDMT's.   History of V. tach storm. Frequent PVCs. NSVT. Patient does have history of significant cardiac arrhythmia and has an ICD. Patient was on amiodarone in the past which was discontinued for hyperthyroidism. Followed by this patient was on mexiletine which appears to be mistakenly patient has stopped taking since July 2023. Discussed with cardiology [name] on 12/18.  Initiated on mexiletine.  If the patient has significant NSVT burden still, they may consider initiation of amiodarone and performing a formal consult at that point.   Type II DM with hyperglycemia with long-term insulin use. Currently continuing basal bolus regimen. Hemoglobin A1c 11.2. Monitor.   Obesity Body mass index is 33.19 kg/m.  Placing the pt at higher risk of poor outcomes.   Goals of care conversation. [name] had extensive discussion with patient's family  with regards to his goals of care in presence of his son. Patient wants to have a natural course of death and wants to be DNR/DNI regardless of the outcome and understands it very well. Patient does have multiple comorbidities including chronic systolic CHF with a EF of 25% requiring an AICD with history of V. tach storm and CAD as well as CKD  3A. This is on top of patient's recent diagnosis of highly aggressive small cell prostate metastatic cancer. Consulted palliative care.  Patient has decided to go home with hospice.  No aggressive interventions pursued at this time.  [place] has been consulted.  Likely discharge in a.m.   Lower extremity edema. Dopplers negative for DVT.   Deconditioning. PT OT reevaluation pending on 12/20.  Family prefers to take the patient home with hospice    Medications     Chlorhexidine Gluconate Cloth  6 each Topical Daily   fluconazole  100 mg Oral Daily   Gerhardt's butt cream   Topical Daily   insulin aspart  0-6 Units Subcutaneous TID WC   lidocaine  5 mL Infiltration Once   mexiletine  150 mg Oral Q12H   oxybutynin  5 mg Oral TID   polyethylene glycol  17 g Oral Daily   Ensure Max Protein  11 oz Oral Daily   saccharomyces boulardii  250 mg Oral BID   senna  1 tablet Oral BID     Data Reviewed:   CBG:  Recent Labs  Lab 12/06/22 1634 12/06/22 2101 12/07/22 0735 12/07/22 1012 12/07/22 1131  GLUCAP 117* 117* 118* 143* 136*    SpO2: 95 % O2 Flow Rate (L/min): 2 L/min FiO2 (%): 21 %    Vitals:   12/07/22 1005 12/07/22 1006 12/07/22 1008 12/07/22 1012  BP: (!) 92/51 (!) 86/46 (!) 85/55 113/62  Pulse: 70 72 89 91  Resp:      Temp:      TempSrc:  SpO2:      Weight:      Height:          Data Reviewed:  Basic Metabolic Panel: Recent Labs  Lab 12/01/22 1047 12/03/22 0457 12/04/22 0424  NA 140 141 140  K 3.8 3.0* 4.6  CL 110 109 108  CO2 '25 25 24  '$ GLUCOSE 94 99 101*  BUN '20 16 14  '$ CREATININE 1.52* 1.44* 1.48*  CALCIUM 8.8* 8.2* 8.8*  MG  --  1.8  --     CBC: Recent Labs  Lab 12/01/22 1047 12/03/22 0457  WBC 9.3 7.0  NEUTROABS 5.7  --   HGB 9.1* 8.7*  HCT 29.2* 28.0*  MCV 90.4 89.7  PLT 235 219    LFT No results for input(s): "AST", "ALT", "ALKPHOS", "BILITOT", "PROT", "ALBUMIN" in the last 168 hours.     Antibiotics: Anti-infectives (From admission, onward)    Start     Dose/Rate Route Frequency Ordered Stop   11/28/22 2000  amoxicillin-clavulanate (AUGMENTIN) 875-125 MG per tablet 1 tablet  Status:  Discontinued        1 tablet Oral Every 12 hours 11/28/22 1038 12/07/22 1202   11/27/22 1515  doxycycline (VIBRA-TABS) tablet 100 mg  Status:  Discontinued        100 mg Oral Every 12 hours 11/27/22 1422 12/07/22 1202   11/26/22 1000  fluconazole (DIFLUCAN) tablet 100 mg        100 mg Oral Daily 11/26/22 0843     11/26/22 1000  piperacillin-tazobactam (ZOSYN) IVPB 3.375 g  Status:  Discontinued        3.375 g 12.5 mL/hr over 240 Minutes Intravenous Every 8 hours 11/26/22 0853 11/28/22 1038   11/25/22 2000  ceFEPIme (MAXIPIME) 2 g in sodium chloride 0.9 % 100 mL IVPB  Status:  Discontinued        2 g 200 mL/hr over 30 Minutes Intravenous Every 12 hours 11/25/22 1620 11/26/22 0751   11/25/22 1330  Ampicillin-Sulbactam (UNASYN) 3 g in sodium chloride 0.9 % 100 mL IVPB        3 g 200 mL/hr over 30 Minutes Intravenous  Once 11/24/22 1659 11/25/22 1406   11/24/22 1000  fluconazole (DIFLUCAN) IVPB 100 mg  Status:  Discontinued        100 mg 50 mL/hr over 60 Minutes Intravenous Every 24 hours 11/24/22 0832 11/26/22 0843   11/23/22 1000  fluconazole (DIFLUCAN) tablet 200 mg  Status:  Discontinued        200 mg Oral Daily 11/22/22 1713 11/24/22 0832   11/21/22 1200  fluconazole (DIFLUCAN) IVPB 200 mg  Status:  Discontinued        200 mg 100 mL/hr over 60 Minutes Intravenous Every 24 hours 11/21/22 0909 11/22/22 1713   11/16/22 1300  ciprofloxacin (CIPRO) IVPB 400 mg        400 mg 200 mL/hr over 60 Minutes Intravenous Every 12 hours 11/16/22 1245 11/21/22 2233   11/16/22 1159  ciprofloxacin (CIPRO) 400 MG/200ML IVPB       Note to Pharmacy: [employer]ane [employer] A: cabinet override      11/16/22 1159 11/16/22 1221        DVT prophylaxis: TED hose  Code Status: DNR  Family Communication: Discussed  with patient's wife at bedside   CONSULTS oncology, urology   Objective    Physical Examination:  General-appears in no acute distress Heart-S1-S2, regular, no murmur auscultated Lungs-clear to auscultation bilaterally Abdomen-soft, nontender, no organomegaly Extremities-no edema in the  lower extremities Neuro-alert, oriented x3, no focal deficit noted   Status is: Inpatient:             Oswald Hillock   [facility] If 7PM-7AM, please contact night-coverage at [email or web address], Office  [phone]   12/07/2022, 3:07 PM  LOS: 20 days

## 2022-12-07 NOTE — TOC Progression Note (Signed)
Transition of Care [facility]) - Progression Note    Patient Details  Name: Wayne [name] MRN: [id number] Date of Birth: 03/30/1941  Transition [facility]) CM/SW Contact  Henrietta Dine, RN Phone Number: 12/07/2022, 12:21 PM  Clinical Narrative:    Benjamine Mola from [place] called to inquire about pt d/c date; per Dr Darrick Meigs the pt will d/c tomorrow; notified Benjamine Mola and she says they will be out to see the pt on Tuesday.   Expected Discharge Plan: [place] Barriers to Discharge: Continued Medical Work up  Expected Discharge Plan and Services   Discharge Planning Services: CM Consult   Living arrangements for the past 2 months: [place]: PT [place]: [place] Date [place]: 11/30/22 Time [place]: [place] Representative spoke with at [place]: [place] ([place]) Interventions [place]: No Food Insecurity (11/16/2022)  Housing: Low Risk  (11/16/2022)  Transportation Needs: No Transportation Needs (11/16/2022)  Utilities: Not At Risk (11/16/2022)  Depression (PHQ2-9): Low Risk  (09/17/2022)  Financial Resource Strain: Low Risk  (09/20/2018)  Tobacco Use: Medium Risk (11/26/2022)    Readmission Risk Interventions     No data to display

## 2022-12-08 DIAGNOSIS — N139 Obstructive and reflux uropathy, unspecified: Secondary | ICD-10-CM | POA: Diagnosis not present

## 2022-12-08 LAB — GLUCOSE, CAPILLARY: Glucose-Capillary: 108 mg/dL — ABNORMAL HIGH (ref 70–99)

## 2022-12-08 NOTE — TOC Transition Note (Signed)
Transition of Care [facility]) - CM/SW Discharge Note   Patient Details  Name: Wayne [name] MRN: [id number] Date of Birth: July 16, 1941  Transition of Care [facility]) CM/SW Contact:  Leeroy Cha, RN Phone Number: 12/08/2022, 10:54 AM   Clinical Narrative:    Patient discharged to home with hospice care to start on [id number].  Arrangement made on [phone].   Final next level of care: Home w Hospice Care Barriers to Discharge: Barriers Resolved   Patient Goals and CMS Choice      Discharge Placement                         Discharge Plan and Services Additional resources added to the After Visit Summary for     Discharge Planning Services: CM Consult                      HH Arranged: PT [facility] Agency: [place] Date [place]: 11/30/22 Time [place]: [id number] Representative spoke with at [place]: [place] ([place]) Interventions [place]: No Food Insecurity (11/16/2022)  Housing: Low Risk  (11/16/2022)  Transportation Needs: No Transportation Needs (11/16/2022)  Utilities: Not At Risk (11/16/2022)  Depression (PHQ2-9): Low Risk  (09/17/2022)  Financial Resource Strain: Low Risk  (09/20/2018)  Tobacco Use: Medium Risk (11/26/2022)     Readmission Risk Interventions   No data to display

## 2022-12-08 NOTE — Discharge Summary (Signed)
Physician Discharge Summary   Patient: Wayne [name] MRN: [id number] DOB: Oct 14, 1941  Admit date:     11/16/2022  Discharge date: 12/08/22  Discharge Physician: Oswald Hillock   PCP: [name], MD   Recommendations at discharge:   Patient to go home with home hospice  Discharge Diagnoses: Principal Problem:   Obstructive uropathy Active Problems:   Hyperlipidemia   Essential hypertension   Systolic CHF, chronic (HCC)   OSA (obstructive sleep apnea)   CAD (coronary artery disease)   CKD (chronic kidney disease) stage 3, GFR 30-59 ml/min (HCC)   Paroxysmal atrial fibrillation (HCC)   Gross hematuria   Type 2 diabetes mellitus with hyperglycemia (HCC)   UTI (urinary tract infection) Probable   Candiduria   Hypotension   Acute blood loss anemia   NSVT (nonsustained ventricular tachycardia) ([place])   HCAP (healthcare-associated pneumonia)   Paronychia of right thumb   Palliative care encounter   Goals of care, counseling/discussion   Counseling and coordination of care   Constipation   Small cell carcinoma ([place])   Metastatic malignant neoplasm ([place])   Need for emotional support   Orthostatic dizziness  Resolved Problems:   * No resolved hospital problems. [facility] Course:  81 y.o. PMH of CAD SP PCI, PAF on Eliquis, HTN, chronic combined CHF, OSA, neuropathy, type II DM, PMR, prostate cancer SP brachytherapy, bladder cancer, AICD implant. Presented to hospital with complaints of hematuria and catheter dysfunction. Urology following.  Developed hospital induced delirium. 12/3 cystoscopy with clot evacuation and fulguration.  Initiated on CBI.  Started on Cipro. 12/5 CT-guided biopsy of left iliac and axillary lymph nodes. 12/8 started on fluconazole for yeast in urine 12/12 TURP, TURBT, bilateral pyelogram and ureteral stent placement.  Started on IV antibiotic for pneumonia 12/14 found to have left thumb paronychia doxycycline was added.  Underwent bedside  debridement with hand surgery 12/15 biopsies came back positive for small cell metastatic prostate carcinoma.  Medical oncology was consulted.  Foley catheter was removed. 12/16 Foley catheter reinserted due to retention. 12/17 patient decided to transition to DNR/DNI from full code had frequent PVCs on telemetry.  Wound cultures came back positive from the thumb for Candida as well.  Workup for candidemia initiated.  Discussed with ID.  Recommend to continue oral fluconazole. 12/18 discussed with [name] from cardiology due to NSVT, initiated on mexiletine.  Palliative care consulted 12/19 echocardiogram shows evidence of aortic valve thickening.  Will require ID involvement based on goals of care conversation after [name] discussion with the family.  Assessment and Plan:  Gross hematuria. bladder cancer and prostate cancer. 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 Obstructive uropathy. Seen in ED by urology. Underwent cystoscopy on 12/3 and 12/12. Currently urine appears to be clear. Treated with CBI. Patient had retention issues after removal of the Foley catheter on 12/15 and therefore Foley was inserted on 12/16 again.  Plan to discharge home with Foley catheter  Anticoagulation is permanently discontinued due to frequent hematuria on anticoagulation.   Postural hypotension -Patient was dizzy yesterday -Stop Coreg, Lasix, losartan, mexiletine -Dizziness has improved, patient also received LR at 125 mL/h    Candida UTI. Candida in left thumb cultures Aortic valve thickness. Concern for candidemia. Currently on fluconazole.   Initial plan was for 14-day therapy.  Will continue for longer duration for now. Anticoagulation had to change to Lovenox for that.  Now stopped. Patient's cultures from the left thumb also growing Candida.  There is a concern that the patient may actually have a candidemia. Blood cultures performed.  So far no growth. Aortic  valve thickness seen on the echocardiogram.  Called and discussed with ID, Dr.Mayanka Candiss Norse, she recommends getting a TEE to rule out thrombus versus vegetation on the aortic valve.  -Initially cardiology was consulted for possible TEE -Discussed with patient, he does not want to pursue TEE, patient has decided to go to rehab with hospice  -Will cancel TEE. -Will discontinue doxycycline, Augmentin -Patient is going home with home hospice.  Discontinue Diflucan   Hypokalemia -Replete   History of bladder cancer. Biopsy positive for metastatic small cell prostate cancer. Discussed with [name].  Will monitor recommendation. Otherwise management per urology.   lymphoma/SLL. Axillary lymph node biopsy positive for possible lymphoma/SLL. Hematology consulted with [name]. Will monitor recommendation   Left thumb paronychia Was on IV Zosyn and doxycycline.  Will switch to oral Augmentin. Follow-up on cultures.  Can narrow down antibiotic to Augmentin only. Highly appreciate hand surgery consultation.  Underwent bedside debridement. Continue daily dressing changes.   Hospital induced delirium. Resolved.  Continue as needed Haldol   ?  Aspiration pneumonia -Patient was treated with IV Zosyn -Zosyn has been discontinued   AKI on CKD 3A. Carcinoma of the prostate with metastasis.  Renal function now stabilizing after decompression with Foley catheter.    CAD. Stable. On statin.  Anticoagulation currently on hold.   Chronic combined CHF. Hypertension. Blood pressure soft. Currently on 79.125 mg Coreg and mexiletine.  -Started on Lasix 20 mg daily  -Not a candidate for [place] while on Diflucan. Blood pressure limiting other GDMT's.   History of V. tach storm. Frequent PVCs. NSVT. Patient does have history of significant cardiac arrhythmia and has an ICD. Patient was on amiodarone in the past which was discontinued for hyperthyroidism. Followed by this patient was on  mexiletine which appears to be mistakenly patient has stopped taking since July 81. Discussed with cardiology [name] on 12/18.  Initiated on mexiletine.  If the patient has significant NSVT burden still, they may consider initiation of amiodarone and performing a formal consult at that point.   Type II DM with hyperglycemia with long-term insulin use. Currently continuing basal bolus regimen. Hemoglobin A1c 11.2. Monitor.   Obesity Body mass index is 33.19 kg/m.  Placing the pt at higher risk of poor outcomes.   Goals of care conversation. [name] had extensive discussion with patient's family  with regards to his goals of care in presence of his son. Patient wants to have a natural course of death and wants to be DNR/DNI regardless of the outcome and understands it very well. Patient does have multiple comorbidities including chronic systolic CHF with a EF of 25% requiring an AICD with history of V. tach storm and CAD as well as CKD 3A. This is on top of patient's recent diagnosis of highly aggressive small cell prostate metastatic cancer. Consulted palliative care.  Patient has decided to go home with hospice.  No aggressive interventions pursued at this time.  [place] has been consulted.   -Will discharge home today   Lower extremity edema. Dopplers negative for DVT.   Deconditioning. PT OT reevaluation pending on 12/20.  Family prefers to take the patient home with hospice         Consultants: Palliative care, cardiology Procedures performed:  Disposition: Home Diet recommendation:  Discharge Diet Orders (From admission, onward)     Start  Ordered   12/08/22 0000  Diet - low sodium heart healthy        12/08/22 1007           Regular diet DISCHARGE MEDICATION: Allergies as of 12/08/2022       Reactions   Entresto [sacubitril-valsartan] Swelling, Other (See Comments)   Angioedema   Crestor [rosuvastatin Calcium] Other (See Comments)    Myalgia and Interferes with gait in high doses   Lipitor [atorvastatin] Other (See Comments)   Makes legs sore   Plavix [clopidogrel] Hives, Itching, Swelling, Other (See Comments)   Nose bleeds and welts on legs & back   Ancef [cefazolin] Itching, Rash, Other (See Comments)   Describes itching and rash, but said that "it wasn't that bad"        Medication List     STOP taking these medications    Accu-Chek Aviva Plus test strip Generic drug: glucose blood   bacitracin ointment   carvedilol 12.5 MG tablet Commonly known as: COREG   dapagliflozin propanediol 10 MG Tabs tablet Commonly known as: Farxiga   doxycycline 100 MG capsule Commonly known as: VIBRAMYCIN   Eliquis 5 MG Tabs tablet Generic drug: apixaban   Fish Oil 1000 MG Caps   furosemide 20 MG tablet Commonly known as: LASIX   Lokelma 5 g packet Generic drug: sodium zirconium cyclosilicate   losartan 25 MG tablet Commonly known as: COZAAR   methimazole 10 MG tablet Commonly known as: TAPAZOLE   mexiletine 150 MG capsule Commonly known as: MEXITIL   multivitamin with minerals Tabs tablet   nitrofurantoin (macrocrystal-monohydrate) 100 MG capsule Commonly known as: MACROBID   rosuvastatin 20 MG tablet Commonly known as: CRESTOR   rosuvastatin 40 MG tablet Commonly known as: CRESTOR   spironolactone 25 MG tablet Commonly known as: Aldactone   tamsulosin 0.4 MG Caps capsule Commonly known as: FLOMAX   TRUEplus Lancets 30G Misc       TAKE these medications    diphenhydramine-acetaminophen 25-500 MG Tabs tablet Commonly known as: TYLENOL PM Take 1 tablet at bedtime as needed by mouth (for sleep).   Tylenol 8 Hour Arthritis Pain 650 MG CR tablet Generic drug: acetaminophen Take 650-1,300 mg by mouth every 8 (eight) hours as needed for pain.        Follow-up Information     Festus Aloe, MD. Call.   Specialty: Urology Contact information: [place]  [address] [phone]         Care, [facility] Follow up.   Specialty: Home Health Services Contact information: [place] [address] [phone]                Discharge Exam: Danley Danker Weights   11/16/22 0830 11/16/22 1433 11/25/22 1256  Weight: 101.2 kg 99.8 kg 99 kg   General-appears in no acute distress Heart-S1-S2, regular, no murmur auscultated Lungs-clear to auscultation bilaterally, no wheezing or crackles auscultated Abdomen-soft, nontender, no organomegaly Extremities-no edema in the lower extremities Neuro-alert, oriented x3, no focal deficit noted  Condition at discharge: stable  The results of significant diagnostics from this hospitalization (including imaging, microbiology, ancillary and laboratory) are listed below for reference.   Imaging Studies: ECHOCARDIOGRAM COMPLETE  Result Date: 12/02/2022    ECHOCARDIOGRAM REPORT   Patient Name:   SHLOK RAZ Date of Exam: 12/02/2022 Medical Rec #:  [id number]    Height:       68.0 in Accession #:    [id number]  Weight:       218.3 lb Date of Birth:  10/22/1941    BSA:          2.121 m Patient Age:    53 years     BP:           101/71 mmHg Patient Gender: M            HR:           71 bpm. Exam Location:  Inpatient Procedure: 2D Echo and Intracardiac Opacification Agent Indications:    Bacteremia  History:        Patient has prior history of Echocardiogram examinations, most                 recent 08/21/2022. CHF, CAD; Risk Factors:Dyslipidemia, Diabetes                 and Hypertension.  Sonographer:    Harvie Junior Referring Phys: [id number] [facility] M PATEL  Sonographer Comments: Technically difficult study due to poor echo windows. IMPRESSIONS  1. Left ventricular ejection fraction, by estimation, is 25 to 30%. The left ventricle has severely decreased function. The left ventricle demonstrates global hypokinesis. Left ventricular diastolic parameters are consistent with Grade I diastolic  dysfunction (impaired relaxation).  2. Right ventricular systolic function is normal. The right ventricular size is mildly enlarged. There is normal pulmonary artery systolic pressure. The estimated right ventricular systolic pressure is 94.1 mmHg.  3. Left atrial size was mildly dilated.  4. The mitral valve is grossly normal. Trivial mitral valve regurgitation. No evidence of mitral stenosis.  5. The aortic valve is tricuspid. There is moderate calcification of the aortic valve. There is moderate thickening of the aortic valve. Aortic valve regurgitation is not visualized. Mild aortic valve stenosis.  6. The inferior vena cava is normal in size with greater than 50% respiratory variability, suggesting right atrial pressure of 3 mmHg. Comparison(s): No significant change from prior study. Conclusion(s)/Recommendation(s): No evidence of valvular vegetations on this transthoracic echocardiogram. Consider a transesophageal echocardiogram to exclude infective endocarditis if clinically indicated. FINDINGS  Left Ventricle: Left ventricular ejection fraction, by estimation, is 25 to 30%. The left ventricle has severely decreased function. The left ventricle demonstrates global hypokinesis. Definity contrast agent was given IV to delineate the left ventricular endocardial borders. The left ventricular internal cavity size was normal in size. There is no left ventricular hypertrophy. Left ventricular diastolic parameters are consistent with Grade I diastolic dysfunction (impaired relaxation). Right Ventricle: The right ventricular size is mildly enlarged. No increase in right ventricular wall thickness. Right ventricular systolic function is normal. There is normal pulmonary artery systolic pressure. The tricuspid regurgitant velocity is 2.17  m/s, and with an assumed right atrial pressure of 3 mmHg, the estimated right ventricular systolic pressure is 74.0 mmHg. Left Atrium: Left atrial size was mildly dilated. Right  Atrium: Right atrial size was normal in size. Pericardium: There is no evidence of pericardial effusion. Mitral Valve: The mitral valve is grossly normal. Trivial mitral valve regurgitation. No evidence of mitral valve stenosis. Tricuspid Valve: The tricuspid valve is grossly normal. Tricuspid valve regurgitation is trivial. No evidence of tricuspid stenosis. Aortic Valve: The aortic valve is tricuspid. There is moderate calcification of the aortic valve. There is moderate thickening of the aortic valve. Aortic valve regurgitation is not visualized. Aortic regurgitation PHT measures 596 msec. Mild aortic stenosis is present. Aortic valve mean gradient measures 10.0 mmHg. Aortic valve peak gradient measures 19.4 mmHg. Aortic  valve area, by VTI measures 2.64 cm. Pulmonic Valve: The pulmonic valve was grossly normal. Pulmonic valve regurgitation is trivial. No evidence of pulmonic stenosis. Aorta: The aortic root and ascending aorta are structurally normal, with no evidence of dilitation. Venous: The inferior vena cava is normal in size with greater than 50% respiratory variability, suggesting right atrial pressure of 3 mmHg. IAS/Shunts: The atrial septum is grossly normal. Additional Comments: A device lead is visualized in the right atrium and right ventricle.  LEFT VENTRICLE PLAX 2D LVIDd:         5.70 cm      Diastology LVIDs:         4.30 cm      LV e' medial:    4.03 cm/s LV PW:         1.00 cm      LV E/e' medial:  18.4 LV IVS:        1.00 cm      LV e' lateral:   7.51 cm/s LVOT diam:     2.70 cm      LV E/e' lateral: 9.9 LV SV:         124 LV SV Index:   59 LVOT Area:     5.73 cm  LV Volumes (MOD) LV vol d, MOD A2C: 199.0 ml LV vol d, MOD A4C: 225.0 ml LV vol s, MOD A2C: 89.1 ml LV vol s, MOD A4C: 93.5 ml LV SV MOD A2C:     109.9 ml LV SV MOD A4C:     225.0 ml LV SV MOD BP:      123.6 ml RIGHT VENTRICLE RV Basal diam:  4.30 cm RV S prime:     11.30 cm/s TAPSE (M-mode): 1.9 cm LEFT ATRIUM             Index         RIGHT ATRIUM           Index LA diam:        3.80 cm 1.79 cm/m   RA Area:     16.80 cm LA Vol (A2C):   75.9 ml 35.78 ml/m  RA Volume:   49.80 ml  23.48 ml/m LA Vol (A4C):   80.9 ml 38.14 ml/m LA Biplane Vol: 83.6 ml 39.41 ml/m  AORTIC VALVE                     PULMONIC VALVE AV Area (Vmax):    2.70 cm      PV Vmax:       0.99 m/s AV Area (Vmean):   2.64 cm      PV Peak grad:  3.9 mmHg AV Area (VTI):     2.64 cm AV Vmax:           220.33 cm/s AV Vmean:          156.000 cm/s AV VTI:            0.470 m AV Peak Grad:      19.4 mmHg AV Mean Grad:      10.0 mmHg LVOT Vmax:         104.00 cm/s LVOT Vmean:        72.000 cm/s LVOT VTI:          0.217 m LVOT/AV VTI ratio: 0.46 AI PHT:            596 msec  AORTA Ao Root diam: 3.90 cm Ao Asc diam:  3.60 cm MITRAL  VALVE                TRICUSPID VALVE MV Area (PHT): 2.83 cm     TR Peak grad:   18.8 mmHg MV Decel Time: 268 msec     TR Vmax:        217.00 cm/s MV E velocity: 74.10 cm/s MV A velocity: 120.00 cm/s  SHUNTS MV E/A ratio:  0.62         Systemic VTI:  0.22 m                             Systemic Diam: 2.70 cm Eleonore Chiquito MD Electronically signed by Eleonore Chiquito MD Signature Date/Time: 12/02/2022/4:02:30 PM    Final    VAS [place] LOWER EXTREMITY VENOUS (DVT)  Result Date: 12/01/2022  Lower Venous DVT Study Patient Name:  BRYAN GOIN  Date of Exam:   12/01/2022 Medical Rec #: [id number]     Accession #:    [id number] Date of Birth: 11/23/1941     Patient Gender: M Patient Age:   26 years Exam Location:  [facility] Procedure:      VAS [place] LOWER EXTREMITY VENOUS (DVT) Referring Phys: PRANAV PATEL --------------------------------------------------------------------------------  Indications: Pain and swelling, LT>RT.  Risk Factors: Cancer - bladder and prostate. History of CHF. Comparison Study: No recent prior studies. Performing Technologist: Darlin Coco RDMS, RVT  Examination Guidelines: A complete evaluation includes B-mode imaging, spectral  Doppler, color Doppler, and power Doppler as needed of all accessible portions of each vessel. Bilateral testing is considered an integral part of a complete examination. Limited examinations for reoccurring indications may be performed as noted. The reflux portion of the exam is performed with the patient in reverse Trendelenburg.  +---------+---------------+---------+-----------+----------+--------------+ RIGHT    CompressibilityPhasicitySpontaneityPropertiesThrombus Aging +---------+---------------+---------+-----------+----------+--------------+ CFV      Full           Yes      Yes                                 +---------+---------------+---------+-----------+----------+--------------+ SFJ      Full                                                        +---------+---------------+---------+-----------+----------+--------------+ FV Prox  Full                                                        +---------+---------------+---------+-----------+----------+--------------+ FV Mid   Full                                                        +---------+---------------+---------+-----------+----------+--------------+ FV DistalFull                                                        +---------+---------------+---------+-----------+----------+--------------+  PFV      Full                                                        +---------+---------------+---------+-----------+----------+--------------+ POP      Full           Yes      Yes                                 +---------+---------------+---------+-----------+----------+--------------+ PTV      Full                                                        +---------+---------------+---------+-----------+----------+--------------+ PERO     Full                                                        +---------+---------------+---------+-----------+----------+--------------+    +---------+---------------+---------+-----------+----------+--------------+ LEFT     CompressibilityPhasicitySpontaneityPropertiesThrombus Aging +---------+---------------+---------+-----------+----------+--------------+ CFV      Full           Yes      Yes                                 +---------+---------------+---------+-----------+----------+--------------+ SFJ      Full                                                        +---------+---------------+---------+-----------+----------+--------------+ FV Prox  Full                                                        +---------+---------------+---------+-----------+----------+--------------+ FV Mid   Full                                                        +---------+---------------+---------+-----------+----------+--------------+ FV DistalFull                                                        +---------+---------------+---------+-----------+----------+--------------+ PFV      Full                                                        +---------+---------------+---------+-----------+----------+--------------+  POP      Full           Yes      Yes                                 +---------+---------------+---------+-----------+----------+--------------+ PTV      Full                                                        +---------+---------------+---------+-----------+----------+--------------+ PERO     Full                                                        +---------+---------------+---------+-----------+----------+--------------+     Summary: RIGHT: - There is no evidence of deep vein thrombosis in the lower extremity.  - No cystic structure found in the popliteal fossa.  LEFT: - There is no evidence of deep vein thrombosis in the lower extremity.  - No cystic structure found in the popliteal fossa.  *See table(s) above for measurements and observations. Electronically signed  by Orlie Pollen on 12/01/2022 at 5:39:58 PM.    Final    DG Finger Thumb Right  Result Date: 11/26/2022 CLINICAL DATA:  Swelling of right thumb. EXAM: RIGHT THUMB 2+V COMPARISON:  None Available. FINDINGS: Severe lateral aspect of the thumb interphalangeal joint space narrowing with bone-on-bone contact. Tiny 1 mm long linear lucency in this region contacting the proximal articular surface of the lateral aspect of the distal phalanx, with sclerotic margins and appearing chronic. Moderate dorsal degenerative osteophytosis. Mild-to-moderate mild-to-moderate thumb carpometacarpal joint space narrowing. No acute fracture or dislocation. Mild vascular calcifications. Moderate thumb soft tissue swelling. 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 Electronically Signed   By: Yvonne Kendall M.D.   On: 11/26/2022 17:50   DG C-Arm 1-60 Min-No Report  Result Date: 11/25/2022 Fluoroscopy was utilized by the requesting physician.  No radiographic interpretation.   CT ABDOMEN PELVIS WO CONTRAST  Result Date: 11/24/2022 CLINICAL DATA:  Macroscopic, gross hematuria in a 81 year old male. History of prostate cancer. * Tracking Code: BO * EXAM: CT ABDOMEN AND PELVIS WITHOUT CONTRAST TECHNIQUE: Multidetector CT imaging of the abdomen and pelvis was performed following the standard protocol without IV contrast. RADIATION DOSE REDUCTION: This exam was performed according to the departmental dose-optimization program which includes automated exposure control, adjustment of the mA and/or kV according to patient size and/or use of iterative reconstruction technique. COMPARISON:  October 07, 2022 FINDINGS: Lower chest: Lung bases with signs of LEFT lower lobe consolidative changes which have slightly worsened since previous imaging. The basilar atelectasis in the RIGHT chest. Signs of coronary calcification and pacer defibrillator, incompletely assessed. No chest wall abnormality. Small lymph nodes  adjacent to the esophagus in the upper abdomen largest 7 mm (image 14/2) Nodal disease in the inferior subcarinal nodal stations (image 1/2) 13 mm partially visible and better displayed on PET imaging from November 12, 2022. Hepatobiliary: Smooth hepatic contours. No pericholecystic stranding. No gross biliary duct distension. No visible lesion on noncontrast imaging. Cholelithiasis. Pancreas: Pancreas with normal contours, no signs of inflammation or peripancreatic fluid.  Spleen: Normal. Adrenals/Urinary Tract: Adrenal glands are normal. No hydronephrosis. No nephrolithiasis. Exophytic water density lesion arises from the lower pole the RIGHT kidney which does not require dedicated imaging follow-up and is unchanged. Urinary bladder is collapsed about a Foley catheter. There is blood in the lumen. Marked bladder wall thickening is contiguous with abnormal soft tissue about the prostate, see below. Stomach/Bowel: No acute gastrointestinal findings. Appendix is normal. Rectum is displaced and encased by abnormal soft tissue in the rectoprostatic region presumably related to local invasion from tumor that was seen on previous imaging. This appears more confluent than on prior imaging. Vascular/Lymphatic: Pelvic adenopathy and nodularity about the prostate. LEFT pelvic lymph nodes 17 mm is similar to the PET of November 29th but is increased as compared to imaging from October of 2023. Presacral lymph node (image 65/2) 15 mm short axis, previously 14 mm. Scattered small lymph nodes throughout the retroperitoneum some with abnormal morphology are unchanged. Reproductive: Nodular soft tissue throughout the area of the prostate and adjacent seminal vesicles, contiguous with mesorectal nodal enlargement and displacement of the rectum may show increased mass effect upon the adjacent rectum. Measuring approximately 6.2 x 4.8 cm in the axial plane previously approximately 5.4 x 5.1 cm when measured in a similar fashion.  Mesorectal lymph node (image 72/2) 17 mm previously 13 mm. Lymph node or tumor deposit along the LEFT mesorectum 2 cm short axis, previously approximately 1.71.8 cm. Prostate with brachytherapy seeds in place. Abnormal soft tissue also inseparable from the posterior urinary bladder. Aortic atherosclerosis. Other: No ascites small fat containing bilateral inguinal hernias. Musculoskeletal: No acute musculoskeletal process. Spinal degenerative changes. IMPRESSION: 1. Signs of blood in the bladder lumen of the urinary bladder. Urinary bladder decompressed by Foley catheter and inseparable from disease recurrence in the prostate and about the pelvis as discussed. Again, the patient has a history of both bladder and prostate primary. Findings show marked interval worsening particularly when compared to imaging from September of 2023. 2. Suspect interval worsening of dominant area of disease in the pelvis and some of the adjacent areas of nodularity and or nodal disease with further mass effect upon the rectum. 3. Signs of nodal disease in the chest are better visualized on previous PET imaging. 4. Increasing consolidation or collapse of LEFT lower lobe. 5. Cholelithiasis. 6. Aortic atherosclerosis. Aortic Atherosclerosis (ICD10-I70.0). Electronically Signed   By: Zetta Bills M.D.   On: 11/24/2022 11:27   CT US GUIDED BIOPSY  Result Date: 11/18/2022 CLINICAL DATA:  History of bladder carcinoma with enlarged iliac lymph nodes as well as prior imaging demonstrating enlarged lymph nodes in bilateral supraclavicular and axillary nodal stations suspicious for potential additional lymphoproliferative process/neoplasm. Request for lymph node sampling in both iliac and axillary lymph node stations. EXAM: 1. CT GUIDED CORE BIOPSY OF LEFT ILIAC LYMPH NODE 2. ULTRASOUND-GUIDED CORE BIOPSY OF LEFT AXILLARY LYMPH NODE ANESTHESIA/SEDATION: Moderate (conscious) sedation was employed during this procedure. A total of Versed 1.0 mg  and Fentanyl 25 mcg was administered intravenously by radiology nursing. Moderate Sedation Time: 36 minutes. The patient's level of consciousness and vital signs were monitored continuously by radiology nursing throughout the procedure under my direct supervision. PROCEDURE: The procedure risks, benefits, and alternatives were explained to the patient. Questions regarding the procedure were encouraged and answered. The patient understands and consents to the procedure. A time-out was performed prior to initiating the procedure. CT was performed through the pelvis in a supine position. The lower left abdominal wall was prepped  with chlorhexidine in a sterile fashion, and a sterile drape was applied covering the operative field. A sterile gown and sterile gloves were used for the procedure. Local anesthesia was provided with 1% Lidocaine. Under CT guidance, a 17 gauge trocar needle was advanced to the level of a left external iliac lymph node. After confirming needle tip position, 6 separate 18 gauge core biopsy samples were obtained. Three were submitted in formalin and 3 in saline. Additional CT was performed and the 17 gauge needle removed. Ultrasound was performed of the left axilla. The skin was prepped with chlorhexidine and local anesthesia was provided with 1% lidocaine. 14 gauge core biopsy samples were obtained of an enlarged left axillary lymph node. Core biopsy samples were submitted in both formalin and saline. Additional ultrasound was performed. RADIATION DOSE REDUCTION: This exam was performed according to the departmental dose-optimization program which includes automated exposure control, adjustment of the mA and/or kV according to patient size and/or use of iterative reconstruction technique. COMPLICATIONS: None FINDINGS: Enlarged distal left external iliac lymph node was targeted under CT guidance located lateral to the bladder and measuring approximately 2.5 cm in greatest diameter. Solid core  biopsy samples were obtained. Multiple mildly enlarged left-sided axillary lymph nodes were identified by ultrasound. Two separate adjacent lymph nodes were sampled under ultrasound guidance. IMPRESSION: 1. CT-guided core biopsy of enlarged distal left external iliac lymph node. 2. Ultrasound-guided core biopsy performed of an enlarged left axillary lymph node. Electronically Signed   By: Aletta Edouard M.D.   On: 11/18/2022 14:27   CT ABDOMINAL MASS BIOPSY  Result Date: 11/18/2022 CLINICAL DATA:  History of bladder carcinoma with enlarged iliac lymph nodes as well as prior imaging demonstrating enlarged lymph nodes in bilateral supraclavicular and axillary nodal stations suspicious for potential additional lymphoproliferative process/neoplasm. Request for lymph node sampling in both iliac and axillary lymph node stations. EXAM: 1. CT GUIDED CORE BIOPSY OF LEFT ILIAC LYMPH NODE 2. ULTRASOUND-GUIDED CORE BIOPSY OF LEFT AXILLARY LYMPH NODE ANESTHESIA/SEDATION: Moderate (conscious) sedation was employed during this procedure. A total of Versed 1.0 mg and Fentanyl 25 mcg was administered intravenously by radiology nursing. Moderate Sedation Time: 36 minutes. The patient's level of consciousness and vital signs were monitored continuously by radiology nursing throughout the procedure under my direct supervision. PROCEDURE: The procedure risks, benefits, and alternatives were explained to the patient. Questions regarding the procedure were encouraged and answered. The patient understands and consents to the procedure. A time-out was performed prior to initiating the procedure. CT was performed through the pelvis in a supine position. The lower left abdominal wall was prepped with chlorhexidine in a sterile fashion, and a sterile drape was applied covering the operative field. A sterile gown and sterile gloves were used for the procedure. Local anesthesia was provided with 1% Lidocaine. Under CT guidance, a 17 gauge  trocar needle was advanced to the level of a left external iliac lymph node. After confirming needle tip position, 6 separate 18 gauge core biopsy samples were obtained. Three were submitted in formalin and 3 in saline. Additional CT was performed and the 17 gauge needle removed. Ultrasound was performed of the left axilla. The skin was prepped with chlorhexidine and local anesthesia was provided with 1% lidocaine. 43 gauge core biopsy samples were obtained of an enlarged left axillary lymph node. Core biopsy samples were submitted in both formalin and saline. Additional ultrasound was performed. RADIATION DOSE REDUCTION: This exam was performed according to the departmental dose-optimization program which includes automated  exposure control, adjustment of the mA and/or kV according to patient size and/or use of iterative reconstruction technique. COMPLICATIONS: None FINDINGS: Enlarged distal left external iliac lymph node was targeted under CT guidance located lateral to the bladder and measuring approximately 2.5 cm in greatest diameter. Solid core biopsy samples were obtained. Multiple mildly enlarged left-sided axillary lymph nodes were identified by ultrasound. Two separate adjacent lymph nodes were sampled under ultrasound guidance. IMPRESSION: 1. CT-guided core biopsy of enlarged distal left external iliac lymph node. 2. Ultrasound-guided core biopsy performed of an enlarged left axillary lymph node. Electronically Signed   By: Aletta Edouard M.D.   On: 11/18/2022 14:27   NM PET Image Initial (PI) Skull Base To Thigh (F-18 FDG)  Result Date: 11/13/2022 CLINICAL DATA:  Initial treatment strategy for bladder cancer. Surgery x1. History of prostate cancer with radiation seeds in 1986. Rectal pain. EXAM: NUCLEAR MEDICINE PET SKULL BASE TO THIGH TECHNIQUE: 12.7 mCi F-18 FDG was injected intravenously. Full-ring PET imaging was performed from the skull base to thigh after the radiotracer. CT data was obtained  and used for attenuation correction and anatomic localization. Fasting blood glucose: 255 mg/dl COMPARISON:  10/07/2022 abdominopelvic CT. FINDINGS: Mediastinal blood pool activity: SUV max 1.8 Liver activity: SUV max 2.8 NECK: Extensive non FDG avid cervical adenopathy. Example low right posterior triangle 1.5 cm node on 51/2 measures a S.U.V. max of 1.1. Incidental CT findings: None. CHEST: No pulmonary parenchymal hypermetabolism identified. Bilateral axillary adenopathy. Example left axillary node of 2.5 cm and a S.U.V. max of 1.7 on 81/2. Right paratracheal node measures 2.5 cm and a S.U.V. max of 1.8 on 81/2. Incidental CT findings: Pacer. Moderate cardiomegaly. Aortic and coronary artery calcification. Pulmonary artery enlargement, outflow tract 3.2 cm. ABDOMEN/PELVIS: Left periaortic 11 mm node on 169/2 measures a S.U.V. max of 1.3. 9 mm on the prior CT. A presacral upper pelvic node measures 1.5 cm and a S.U.V. max of 2.6 on 208/2. Compare 1.1 cm on 10/07/2022 (when remeasured). Left greater than right pelvic sidewall hypermetabolic adenopathy. Example left external iliac 1.6 cm node measures a S.U.V. max of 2.5 on 217/2. Increased from 11 mm on the prior diagnostic CT (when remeasured). Amorphous soft tissue mass centered about the posterior wall of the bladder and superior portion of the prostate is mildly hypermetabolic. Difficult to measure secondary to ill-defined morphology. On the order of 6.0 x 5.0 cm and a S.U.V. max of 4.6 on 226/2. Incidental CT findings: Radiation seeds in the prostate. Urinary bladder thick-walled but collapsed around a Foley catheter. Dependent gallstones. Normal adrenal glands. Abdominal aortic as sclerosis. Lower pole right renal low-density 2.3 cm lesion is likely a cyst or minimally complex cyst . In the absence of clinically indicated signs/symptoms require(s) no independent follow-up. Small fat containing bilateral inguinal hernias. SKELETON: Left subscapularis  hypermetabolism is likely due to tendinous strain. No marrow hypermetabolism identified. Incidental CT findings: None. IMPRESSION: 1. Hypermetabolic soft tissue mass centered about the posterior bladder and superior aspect of the prostate. Given history of both prostate and bladder cancer, this could represent direct extension of either or both primaries. 2. Progressive hypermetabolic pelvic adenopathy since 10/07/2022, consistent with metastatic disease. 3. Concurrent relatively non FDG avid cervical and thoracic adenopathy, favored to represent a synchronous lymphoproliferative process such as indolent lymphoma. An atypical distribution of metastatic disease felt less likely. Consider tissue sampling of axillary or supraclavicular nodes. 4. Progressive abdominal adenopathy which could be from either of the patient's presumed to primaries. 5.  Incidental findings, including: Coronary artery atherosclerosis. Aortic Atherosclerosis (ICD10-I70.0). Cholelithiasis. Pulmonary artery enlargement suggests pulmonary arterial hypertension. Electronically Signed   By: Abigail Miyamoto M.D.   On: 11/13/2022 09:27    Microbiology: Results for orders placed or performed during the hospital encounter of 11/16/22  Urine Culture     Status: Abnormal   Collection Time: 11/19/22  1:30 PM   Specimen: Urine, Catheterized  Result Value Ref Range Status   Specimen Description   Final    URINE, CATHETERIZED Performed at [facility], [place] [address], [place], [place] [address]    Special Requests   Final    NONE Performed at [facility], [place] [address] [place], [place] [address]    Culture 80,000 COLONIES/mL YEAST (A)  Final   Report Status 11/20/2022 FINAL  Final  Surgical pcr screen     Status: None   Collection Time: 11/25/22  8:49 AM   Specimen: Nasal Mucosa; Nasal Swab  Result Value Ref Range Status   MRSA, PCR NEGATIVE NEGATIVE Final   Staphylococcus aureus NEGATIVE NEGATIVE  Final    Comment: (NOTE) The Xpert SA Assay (FDA approved for NASAL specimens in patients 37 years of age and older), is one component of a comprehensive surveillance program. It is not intended to diagnose infection nor to guide or monitor treatment. Performed at [facility], [place] [address] [place], [place] [address]   Aerobic Culture w Gram Stain (superficial specimen)     Status: None (Preliminary result)   Collection Time: 11/27/22  7:19 PM   Specimen: Other Source  Result Value Ref Range Status   Specimen Description OTHER  Final   Special Requests OTHER  Final   Gram Stain   Final    FEW WBC PRESENT, PREDOMINANTLY PMN FEW GRAM POSITIVE COCCI IN CLUSTERS    Culture   Final    RARE STAPHYLOCOCCUS EPIDERMIDIS RARE CANDIDA ALBICANS Sent to [facility] for further susceptibility testing. Performed at [facility], [place] [address], [place], [place] [address]    Report Status PENDING  Incomplete   Organism ID, Bacteria STAPHYLOCOCCUS EPIDERMIDIS  Final      Susceptibility   Staphylococcus epidermidis - MIC*    CIPROFLOXACIN >=8 RESISTANT Resistant     ERYTHROMYCIN <=0.25 SENSITIVE Sensitive     GENTAMICIN 8 INTERMEDIATE Intermediate     OXACILLIN <=0.25 SENSITIVE Sensitive     TETRACYCLINE 2 SENSITIVE Sensitive     VANCOMYCIN 2 SENSITIVE Sensitive     TRIMETH/SULFA 80 RESISTANT Resistant     CLINDAMYCIN <=0.25 SENSITIVE Sensitive     RIFAMPIN <=0.5 SENSITIVE Sensitive     Inducible Clindamycin NEGATIVE Sensitive     * RARE STAPHYLOCOCCUS EPIDERMIDIS  Antifungal AST 9 Drug Panel     Status: None   Collection Time: 11/27/22  7:19 PM  Result Value Ref Range Status   Organism ID, Yeast Candida albicans  Corrected    Comment: (NOTE) Identification performed by account, not confirmed by this laboratory. CORRECTED ON 12/22 AT 1136: PREVIOUSLY REPORTED AS Preliminary report    Amphotericin B MIC 0.5 ug/mL  Final    Comment: (NOTE) Breakpoints have been  established for only some organism-drug combinations as indicated. This test was developed and its performance characteristics determined by [facility]. It has not been cleared or approved by the Food and Drug Administration.    Anidulafungin MIC Comment  Final    Comment: (NOTE) 0.016 ug/mL Susceptible Breakpoints have been established for only  some organism-drug combinations as indicated. This test was developed and its performance characteristics determined by [facility]. It has not been cleared or approved by the Food and Drug Administration.    Caspofungin MIC Comment  Final    Comment: (NOTE) 0.03 ug/mL Susceptible Breakpoints have been established for only some organism-drug combinations as indicated. This test was developed and its performance characteristics determined by [facility]. It has not been cleared or approved by the Food and Drug Administration.    Micafungin MIC Comment  Final    Comment: (NOTE) 0.008 ug/mL Susceptible Breakpoints have been established for only some organism-drug combinations as indicated. This test was developed and its performance characteristics determined by [facility]. It has not been cleared or approved by the Food and Drug Administration.    Posaconazole MIC 0.06 ug/mL  Final    Comment: (NOTE) Breakpoints have been established for only some organism-drug combinations as indicated. This test was developed and its performance characteristics determined by [facility]. It has not been cleared or approved by the Food and Drug Administration.    Fluconazole Islt MIC 1.0 ug/mL Susceptible  Final    Comment: (NOTE) Breakpoints have been established for only some organism-drug combinations as indicated. This test was developed and its performance characteristics determined by [facility]. It has not been cleared or approved by the Food and Drug Administration.    Flucytosine MIC 0.12 ug/mL  Final    Comment: (NOTE) Breakpoints have been established  for only some organism-drug combinations as indicated. This test was developed and its performance characteristics determined by [facility]. It has not been cleared or approved by the Food and Drug Administration.    Itraconazole MIC 0.06 ug/mL  Final    Comment: (NOTE) Breakpoints have been established for only some organism-drug combinations as indicated. This test was developed and its performance characteristics determined by [facility]. It has not been cleared or approved by the Food and Drug Administration.    Voriconazole MIC Comment  Final    Comment: (NOTE) 0.016 ug/mL Susceptible Breakpoints have been established for only some organism-drug combinations as indicated. This test was developed and its performance characteristics determined by [facility]. It has not been cleared or approved by the Food and Drug Administration. Performed At: [facility] [place], [place] [id number] Rush Farmer MD FA:[id number]    Source CANDIDA ALBICANS/ CATH  Final    Comment: Performed at [facility], [place] [address], [place], [place] [address]  Fungus Culture With Stain     Status: None (Preliminary result)   Collection Time: 11/27/22  7:20 PM   Specimen: Abscess  Result Value Ref Range Status   Fungus Stain Final report  Final    Comment: (NOTE) Performed At: [facility] [place], [place] [id number] Rush Farmer MD [id number]    Fungus (Mycology) Culture PENDING  Incomplete   Fungal Source ABSCESS  Final    Comment: Performed at [facility], [place] [address], [place], [place] [address]  Acid Fast Smear (AFB)     Status: None   Collection Time: 11/27/22  7:20 PM   Specimen: Abscess  Result Value Ref Range Status   AFB Specimen Processing Concentration  Final   Acid Fast Smear Negative  Final    Comment: (NOTE) Performed At: [facility] [place], [place] [id number] Rush Farmer MD  IE:[id number]    Source (AFB) ABSCESS  Final    Comment: Performed at [facility], [place]  [address] [place], [place] [address]  Fungus Culture Result     Status: None   Collection Time: 11/27/22  7:20 PM  Result Value Ref Range Status   Result 1 Comment  Final    Comment: (NOTE) KOH/Calcofluor preparation:  no fungus observed. Performed At: [facility] [place], [place] [id number] Rush Farmer MD [id number]   Culture, blood (Routine X 2) w Reflex to ID Panel     Status: None   Collection Time: 11/30/22  8:32 PM   Specimen: BLOOD  Result Value Ref Range Status   Specimen Description   Final    BLOOD BLOOD LEFT ARM Performed at [place] [address], [place], [place] [address]    Special Requests   Final    BOTTLES DRAWN AEROBIC AND ANAEROBIC Blood Culture adequate volume Performed at [place] [address], [place], [place] [address]    Culture   Final    NO GROWTH 5 DAYS Performed at [facility], [place] [address] [place], [place] [address]    Report Status 12/06/2022 FINAL  Final  Culture, blood (Routine X 2) w Reflex to ID Panel     Status: None   Collection Time: 11/30/22  8:32 PM   Specimen: BLOOD  Result Value Ref Range Status   Specimen Description   Final    BLOOD BLOOD LEFT HAND Performed at [facility] [place] [address] [place], [place] [address]    Special Requests   Final    BOTTLES DRAWN AEROBIC AND ANAEROBIC Blood Culture adequate volume Performed at [place] [address] [place], [place] [address]    Culture   Final    NO GROWTH 5 DAYS Performed at [facility], [place] [address] [place], [place] [address]    Report Status 12/06/2022 FINAL  Final   *Note: Due to a large number of results and/or encounters for the requested time period, some results have not been displayed. A complete set of results can  be found in Results Review.    Labs: CBC: Recent Labs  Lab 12/01/22 1047 12/03/22 0457  WBC 9.3 7.0  NEUTROABS 5.7  --   HGB 9.1* 8.7*  HCT 29.2* 28.0*  MCV 90.4 89.7  PLT 235 734   Basic Metabolic Panel: Recent Labs  Lab 12/01/22 1047 12/03/22 0457 12/04/22 0424  NA 140 141 140  K 3.8 3.0* 4.6  CL 110 109 108  CO2 '25 25 24  '$ GLUCOSE 94 99 101*  BUN '20 16 14  '$ CREATININE 1.52* 1.44* 1.48*  CALCIUM 8.8* 8.2* 8.8*  MG  --  1.8  --    Liver Function Tests: No results for input(s): "AST", "ALT", "ALKPHOS", "BILITOT", "PROT", "ALBUMIN" in the last 168 hours. CBG: Recent Labs  Lab 12/07/22 1012 12/07/22 1131 12/07/22 1631 12/07/22 2035 12/08/22 0737  GLUCAP 143* 136* 141* 121* 108*    Discharge time spent: greater than 30 minutes.  Signed: Oswald Hillock, MD [facility] 12/08/2022

## 2022-12-09 SURGERY — ECHOCARDIOGRAM, TRANSESOPHAGEAL
Anesthesia: Monitor Anesthesia Care

## 2022-12-16 ENCOUNTER — Telehealth: Payer: Self-pay | Admitting: Internal Medicine

## 2022-12-16 NOTE — Telephone Encounter (Signed)
Pt wife called stating she needs to return patient monitor. I ordered a return kit and she should receive it in 7-10 business days.

## 2022-12-16 NOTE — Telephone Encounter (Signed)
Patient states that he is under hospice care now and would like his defib turned off. He would like to know what to do with the device, please advise.

## 2022-12-16 NOTE — Telephone Encounter (Signed)
After further discussion, Pt will have therapies turned off tomorrow in [place] as previously scheduled.  No further action needed.

## 2022-12-16 NOTE — Telephone Encounter (Signed)
Returned call to Pt.  Call went to VM.  Left message requesting call back to device clinic to discuss turning off ICD therapies.

## 2022-12-16 NOTE — Telephone Encounter (Signed)
Returned call to Pt.  He is requesting industry come to his home to deactivate therapies if possible.  Will discuss with MDT.

## 2022-12-17 LAB — AEROBIC CULTURE W GRAM STAIN (SUPERFICIAL SPECIMEN)

## 2022-12-21 LAB — FUNGUS CULTURE WITH STAIN

## 2022-12-21 LAB — FUNGAL ORGANISM REFLEX

## 2022-12-21 LAB — FUNGUS CULTURE RESULT

## 2023-01-15 DEATH — deceased

## 2023-01-16 LAB — ACID FAST CULTURE WITH REFLEXED SENSITIVITIES (MYCOBACTERIA): Acid Fast Culture: NEGATIVE
